# Patient Record
Sex: Female | Born: 1970 | Race: Black or African American | Hispanic: No | Marital: Single | Smoking: Never smoker
Health system: Southern US, Community
[De-identification: ages and names within clinical notes are randomized; demographics above are authoritative.]

## PROBLEM LIST (undated history)

## (undated) DIAGNOSIS — G629 Polyneuropathy, unspecified: Secondary | ICD-10-CM

## (undated) DIAGNOSIS — S43006A Unspecified dislocation of unspecified shoulder joint, initial encounter: Secondary | ICD-10-CM

## (undated) DIAGNOSIS — I639 Cerebral infarction, unspecified: Secondary | ICD-10-CM

## (undated) DIAGNOSIS — K219 Gastro-esophageal reflux disease without esophagitis: Secondary | ICD-10-CM

## (undated) DIAGNOSIS — N189 Chronic kidney disease, unspecified: Secondary | ICD-10-CM

## (undated) DIAGNOSIS — F32A Depression, unspecified: Secondary | ICD-10-CM

## (undated) DIAGNOSIS — I10 Essential (primary) hypertension: Secondary | ICD-10-CM

## (undated) DIAGNOSIS — Z87442 Personal history of urinary calculi: Secondary | ICD-10-CM

## (undated) DIAGNOSIS — J45909 Unspecified asthma, uncomplicated: Secondary | ICD-10-CM

## (undated) DIAGNOSIS — R519 Headache, unspecified: Secondary | ICD-10-CM

## (undated) DIAGNOSIS — I739 Peripheral vascular disease, unspecified: Secondary | ICD-10-CM

## (undated) DIAGNOSIS — F419 Anxiety disorder, unspecified: Secondary | ICD-10-CM

## (undated) DIAGNOSIS — M5030 Other cervical disc degeneration, unspecified cervical region: Secondary | ICD-10-CM

## (undated) DIAGNOSIS — E119 Type 2 diabetes mellitus without complications: Secondary | ICD-10-CM

## (undated) DIAGNOSIS — D649 Anemia, unspecified: Secondary | ICD-10-CM

## (undated) DIAGNOSIS — J189 Pneumonia, unspecified organism: Secondary | ICD-10-CM

## (undated) HISTORY — DX: Chronic kidney disease, unspecified: N18.9

## (undated) HISTORY — PX: APPENDECTOMY: SHX54

## (undated) HISTORY — PX: SHOULDER SURGERY: SHX246

## (undated) HISTORY — DX: Type 2 diabetes mellitus without complications: E11.9

## (undated) HISTORY — DX: Anemia, unspecified: D64.9

## (undated) HISTORY — PX: FOOT SURGERY: SHX648

## (undated) HISTORY — PX: EYE SURGERY: SHX253

## (undated) HISTORY — DX: Unspecified dislocation of unspecified shoulder joint, initial encounter: S43.006A

## (undated) HISTORY — PX: ADENOIDECTOMY: SUR15

## (undated) HISTORY — PX: TONSILLECTOMY: SUR1361

---

## 2012-03-07 ENCOUNTER — Emergency Department (HOSPITAL_COMMUNITY)
Admission: EM | Admit: 2012-03-07 | Discharge: 2012-03-07 | Disposition: A | Discharge status: Home / Self Care | Payer: Medicaid Other | Attending: Emergency Medicine | Admitting: Emergency Medicine

## 2012-03-07 ENCOUNTER — Encounter (HOSPITAL_COMMUNITY): Payer: Self-pay | Admitting: *Deleted

## 2012-03-07 ENCOUNTER — Emergency Department (HOSPITAL_COMMUNITY): Payer: Medicaid Other

## 2012-03-07 DIAGNOSIS — E11319 Type 2 diabetes mellitus with unspecified diabetic retinopathy without macular edema: Secondary | ICD-10-CM | POA: Insufficient documentation

## 2012-03-07 DIAGNOSIS — Z9089 Acquired absence of other organs: Secondary | ICD-10-CM | POA: Insufficient documentation

## 2012-03-07 DIAGNOSIS — H5460 Unqualified visual loss, one eye, unspecified: Secondary | ICD-10-CM | POA: Insufficient documentation

## 2012-03-07 DIAGNOSIS — Z79899 Other long term (current) drug therapy: Secondary | ICD-10-CM | POA: Insufficient documentation

## 2012-03-07 DIAGNOSIS — I10 Essential (primary) hypertension: Secondary | ICD-10-CM | POA: Insufficient documentation

## 2012-03-07 DIAGNOSIS — Z794 Long term (current) use of insulin: Secondary | ICD-10-CM | POA: Insufficient documentation

## 2012-03-07 DIAGNOSIS — E1139 Type 2 diabetes mellitus with other diabetic ophthalmic complication: Secondary | ICD-10-CM | POA: Insufficient documentation

## 2012-03-07 DIAGNOSIS — K219 Gastro-esophageal reflux disease without esophagitis: Secondary | ICD-10-CM | POA: Insufficient documentation

## 2012-03-07 DIAGNOSIS — H5461 Unqualified visual loss, right eye, normal vision left eye: Secondary | ICD-10-CM

## 2012-03-07 HISTORY — DX: Gastro-esophageal reflux disease without esophagitis: K21.9

## 2012-03-07 HISTORY — DX: Polyneuropathy, unspecified: G62.9

## 2012-03-07 HISTORY — DX: Unspecified asthma, uncomplicated: J45.909

## 2012-03-07 HISTORY — DX: Essential (primary) hypertension: I10

## 2012-03-07 MED ORDER — ONDANSETRON 8 MG PO TBDP
8.0000 mg | ORAL_TABLET | Freq: Once | ORAL | Status: AC
  Administered 2012-03-07: 8 mg via ORAL
  Filled 2012-03-07: qty 1

## 2012-03-07 MED ORDER — TETRACAINE HCL 0.5 % OP SOLN
2.0000 [drp] | Freq: Once | OPHTHALMIC | Status: AC
  Administered 2012-03-07: 2 [drp] via OPHTHALMIC
  Filled 2012-03-07: qty 2

## 2012-03-07 MED ORDER — OXYCODONE-ACETAMINOPHEN 5-325 MG PO TABS
1.0000 | ORAL_TABLET | Freq: Once | ORAL | Status: AC
  Administered 2012-03-07: 1 via ORAL
  Filled 2012-03-07: qty 1

## 2012-03-07 NOTE — ED Notes (Signed)
Pt c/o loss of vision in right eye since Saturday. States has hx of diabetes and "the diabetes is affecting my vision, I was supposed to see an eye surgeon but didn't get too." reports "can't see that much out of right eye, I have pressure and blood built up in both of them but it is worse in my right eye."

## 2012-03-07 NOTE — ED Provider Notes (Signed)
History     CSN: [id number]  Arrival date & time 03/07/12  1123   First MD Initiated Contact with Patient 03/07/12 1155      Chief Complaint  Patient presents with  . Visual Field Change     HPI History obtained from the patient and her optometrist in [place].  The patient has a history of diabetic retinopathy and was being seen approximately 10 days ago by her optometrist for routine eyes examined and was noted that her diabetic retinopathy was worsening.  At that time her vision was 20/40 out of both eyes.  She reports she was referred to an ophthalmologist but because of a family emergency she had to present to [facility] was unable to make that appointment.  She now reports progressive vision loss in her right eye.  She's had transient floaters out of her right eye as well.  She now reports that she can't see the color and movement and can count fingers at approximately 3 feet only out of her right eye.  Besides that her vision was noted the 20/200 at the bedside.  She does report some right-sided headache and some right eye pain but reports this is mild.  She has not had any drainage or redness noted of her eyes.  Past Medical History  Diagnosis Date  . Diabetes mellitus   . Hypertension   . Asthma   . GERD (gastroesophageal reflux disease)   . Neuropathy     Past Surgical History  Procedure Date  . Tonsillectomy   . Appendectomy   . Adenoidectomy     History reviewed. No pertinent family history.  History  Substance Use Topics  . Smoking status: Not on file  . Smokeless tobacco: Not on file  . Alcohol Use: No    OB History    Grav Para Term Preterm Abortions TAB SAB Ect Mult Living                  Review of Systems  All other systems reviewed and are negative.    Allergies  Penicillins  Home Medications   Current Outpatient Rx  Name Route Sig Dispense Refill  . ACETAMINOPHEN 500 MG PO TABS Oral Take 1,000 mg by mouth every 6 (six)  hours as needed. For pain.    . ALBUTEROL SULFATE HFA 108 (90 BASE) MCG/ACT IN AERS Inhalation Inhale 2 puffs into the lungs every 6 (six) hours as needed. For shortness of breath.    . ASPIRIN EC 81 MG PO TBEC Oral Take 81 mg by mouth daily.    Marland Kitchen VITAMIN D 1000 UNITS PO TABS Oral Take 1,000 Units by mouth daily.    Marland Kitchen ESOMEPRAZOLE MAGNESIUM 40 MG PO CPDR Oral Take 40 mg by mouth daily before breakfast.    . INSULIN ASPART 100 UNIT/ML Theresa SOLN Subcutaneous Inject 0-10 Units into the skin 3 (three) times daily before meals.    . INSULIN GLARGINE 100 UNIT/ML [place] SOLN Subcutaneous Inject 50 Units into the skin at bedtime.    Marland Kitchen LIRAGLUTIDE 18 MG/3ML  SOLN Subcutaneous Inject 1.2 mg into the skin daily.    Marland Kitchen METOPROLOL SUCCINATE ER 25 MG PO TB24 Oral Take 25 mg by mouth daily.    Marland Kitchen VALSARTAN 160 MG PO TABS Oral Take 160 mg by mouth daily.      BP 155/95  Pulse 79  Temp 98.7 F (37.1 C) (Oral)  Resp 20  Wt 166 lb (75.297 kg)  SpO2 100%  Physical Exam  Nursing note and vitals reviewed. Constitutional: She is oriented to person, place, and time. She appears well-developed and well-nourished. No distress.  HENT:  Head: Normocephalic and atraumatic.  Eyes: Conjunctivae normal, EOM and lids are normal. Pupils are equal, round, and reactive to light. Right eye exhibits no chemosis, no discharge and no exudate. Left eye exhibits no chemosis, no discharge and no exudate. Right conjunctiva is not injected. Right eye exhibits no nystagmus. Left eye exhibits no nystagmus.       20/70 vision of left eye.  20/200 vision in the right eye.  Intraocular pressure in route I consistently in the 32-34 range.  Intraocular pressure in the left thigh consistently in the 20-24 range. No proptosis noted  Neck: Normal range of motion.  Cardiovascular: Normal rate, regular rhythm and normal heart sounds.   Pulmonary/Chest: Effort normal and breath sounds normal.  Abdominal: Soft. She exhibits no distension. There is no  tenderness.  Musculoskeletal: Normal range of motion.  Neurological: She is alert and oriented to person, place, and time.  Skin: Skin is warm and dry.  Psychiatric: She has a normal mood and affect. Judgment normal.    ED Course  Procedures    Labs Reviewed  GLUCOSE, CAPILLARY   Ct Head Wo Contrast  03/07/2012  *RADIOLOGY REPORT*  Clinical Data: Headache, blurred vision  CT HEAD WITHOUT CONTRAST  Technique:  Contiguous axial images were obtained from the base of the skull through the vertex without contrast.  Comparison: None.  Findings: No skull fracture is noted.  Paranasal sinuses and mastoid air cells are unremarkable.  No intracranial hemorrhage, mass effect or midline shift.  No acute infarction.  No mass lesion is noted on this unenhanced scan.  The gray and white matter differentiation is preserved.  No hydrocephalus.  No intra or extra-axial fluid collection.  IMPRESSION:  No acute intracranial abnormality.   Original Report Authenticated By: Lahoma Crocker, M.D.     I personally reviewed the imaging tests through PACS system I reviewed available ER/hospitalization records thought the EMR   1. Vision loss, right eye       MDM  Patient with worsening vision in her right eye over the past 5 days.  Her vision in her right eye is significantly worse than approximately 10 days ago when I discussed her care with her optometrist in [place].  Her vision in her right eye is 20/200.  She also appears to have elevated intraocular pressures in the right eye but is abnormal given that she also has some elevated pressures in her left eye and her vision in her left eye is worsening.  Because of this a CT scan was obtained demonstrating no evidence of intracranial mass or posterior orbital masses.  I discussed her care with the on-call ophthalmologist, Dr Talbert Forest,  who will see her in his office at this time for more detailed eye exam.   [id number]- [place] in  [place], MD 03/07/12 [phone]

## 2012-03-07 NOTE — ED Notes (Signed)
Pt states headache 10/10 to left side, stabbing pain.  Hx of migraines, but pt states "this is not like my migraines."  Vision impairment began Saturday seeing "bugs flying" to currently "I only see blurs of light and color", headache began last night.  Pt able to read vision card top 2 lines from right eye.

## 2012-06-12 HISTORY — PX: CERVICAL SPINE SURGERY: SHX589

## 2012-07-23 ENCOUNTER — Ambulatory Visit: Payer: Medicaid Other | Admitting: Physical Therapy

## 2012-09-03 ENCOUNTER — Emergency Department (HOSPITAL_COMMUNITY)
Admission: EM | Admit: 2012-09-03 | Discharge: 2012-09-03 | Disposition: A | Discharge status: Home / Self Care | Payer: Medicaid Other | Attending: Emergency Medicine | Admitting: Emergency Medicine

## 2012-09-03 ENCOUNTER — Encounter (HOSPITAL_COMMUNITY): Payer: Self-pay

## 2012-09-03 DIAGNOSIS — J45909 Unspecified asthma, uncomplicated: Secondary | ICD-10-CM | POA: Insufficient documentation

## 2012-09-03 DIAGNOSIS — IMO0002 Reserved for concepts with insufficient information to code with codable children: Secondary | ICD-10-CM | POA: Insufficient documentation

## 2012-09-03 DIAGNOSIS — E119 Type 2 diabetes mellitus without complications: Secondary | ICD-10-CM | POA: Insufficient documentation

## 2012-09-03 DIAGNOSIS — Z79899 Other long term (current) drug therapy: Secondary | ICD-10-CM | POA: Insufficient documentation

## 2012-09-03 DIAGNOSIS — K219 Gastro-esophageal reflux disease without esophagitis: Secondary | ICD-10-CM | POA: Insufficient documentation

## 2012-09-03 DIAGNOSIS — I10 Essential (primary) hypertension: Secondary | ICD-10-CM | POA: Insufficient documentation

## 2012-09-03 DIAGNOSIS — M5412 Radiculopathy, cervical region: Secondary | ICD-10-CM

## 2012-09-03 DIAGNOSIS — Z8669 Personal history of other diseases of the nervous system and sense organs: Secondary | ICD-10-CM | POA: Insufficient documentation

## 2012-09-03 DIAGNOSIS — M542 Cervicalgia: Secondary | ICD-10-CM | POA: Insufficient documentation

## 2012-09-03 DIAGNOSIS — G8929 Other chronic pain: Secondary | ICD-10-CM

## 2012-09-03 DIAGNOSIS — Z794 Long term (current) use of insulin: Secondary | ICD-10-CM | POA: Insufficient documentation

## 2012-09-03 HISTORY — DX: Other cervical disc degeneration, unspecified cervical region: M50.30

## 2012-09-03 MED ORDER — HYDROCODONE-ACETAMINOPHEN 5-325 MG PO TABS
1.0000 | ORAL_TABLET | Freq: Once | ORAL | Status: AC
  Administered 2012-09-03: 1 via ORAL
  Filled 2012-09-03: qty 1

## 2012-09-03 MED ORDER — HYDROCODONE-ACETAMINOPHEN 5-325 MG PO TABS: 1.0000 | ORAL_TABLET | ORAL | Status: DC | PRN

## 2012-09-03 NOTE — ED Notes (Signed)
Patient presents with c/o right sided, posterior neck pain that radiates down right shoulder, arm and fingers. Denies numbness or tingling to RUE. Describes pain as a constant pain. Rates 10/10. 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 Had an artifical disc placed in January 2014. Patient reports that pain has not improved since her surgery.
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
## 2012-09-03 NOTE — ED Notes (Signed)
Pt stated that boyfriend is going to drive her home.  Discussed not performing activities that required alertness when taking pain medications.

## 2012-09-03 NOTE — ED Notes (Signed)
Pt had sx in January to replace disc in neck and it has not relieved the pain.  Pt states the pain is actually getting worse.  Neck hurts on extension.  Pain radiates down to right shoulder and arm.  Denies numbness and tingling.  Grip are equal bilaterally.  Pt can only lift right arm up to shoulder height before experiencing extreme pain.

## 2012-09-04 NOTE — ED Provider Notes (Signed)
Medical screening examination/treatment/procedure(s) were performed by non-physician practitioner and as supervising physician I was immediately available for consultation/collaboration.   Dot Lanes, MD 09/04/12 2239

## 2012-09-09 ENCOUNTER — Ambulatory Visit: Payer: Medicaid Other | Attending: Neurosurgery | Admitting: Physical Therapy

## 2012-09-09 DIAGNOSIS — M542 Cervicalgia: Secondary | ICD-10-CM | POA: Insufficient documentation

## 2012-09-09 DIAGNOSIS — IMO0001 Reserved for inherently not codable concepts without codable children: Secondary | ICD-10-CM | POA: Insufficient documentation

## 2012-09-09 DIAGNOSIS — M25519 Pain in unspecified shoulder: Secondary | ICD-10-CM | POA: Insufficient documentation

## 2012-09-17 ENCOUNTER — Ambulatory Visit: Payer: Medicaid Other | Admitting: Physical Therapy

## 2012-09-28 ENCOUNTER — Emergency Department (HOSPITAL_COMMUNITY)
Admission: EM | Admit: 2012-09-28 | Discharge: 2012-09-28 | Disposition: A | Discharge status: Home / Self Care | Payer: Medicaid Other | Attending: Emergency Medicine | Admitting: Emergency Medicine

## 2012-09-28 ENCOUNTER — Emergency Department (HOSPITAL_COMMUNITY): Payer: Medicaid Other

## 2012-09-28 ENCOUNTER — Encounter (HOSPITAL_COMMUNITY): Payer: Self-pay | Admitting: *Deleted

## 2012-09-28 DIAGNOSIS — J45909 Unspecified asthma, uncomplicated: Secondary | ICD-10-CM | POA: Insufficient documentation

## 2012-09-28 DIAGNOSIS — T7411XA Adult physical abuse, confirmed, initial encounter: Secondary | ICD-10-CM | POA: Insufficient documentation

## 2012-09-28 DIAGNOSIS — R0602 Shortness of breath: Secondary | ICD-10-CM | POA: Insufficient documentation

## 2012-09-28 DIAGNOSIS — E1142 Type 2 diabetes mellitus with diabetic polyneuropathy: Secondary | ICD-10-CM | POA: Insufficient documentation

## 2012-09-28 DIAGNOSIS — K219 Gastro-esophageal reflux disease without esophagitis: Secondary | ICD-10-CM | POA: Insufficient documentation

## 2012-09-28 DIAGNOSIS — Z8739 Personal history of other diseases of the musculoskeletal system and connective tissue: Secondary | ICD-10-CM | POA: Insufficient documentation

## 2012-09-28 DIAGNOSIS — I10 Essential (primary) hypertension: Secondary | ICD-10-CM | POA: Insufficient documentation

## 2012-09-28 DIAGNOSIS — S20212A Contusion of left front wall of thorax, initial encounter: Secondary | ICD-10-CM

## 2012-09-28 DIAGNOSIS — S20219A Contusion of unspecified front wall of thorax, initial encounter: Secondary | ICD-10-CM | POA: Insufficient documentation

## 2012-09-28 DIAGNOSIS — Z794 Long term (current) use of insulin: Secondary | ICD-10-CM | POA: Insufficient documentation

## 2012-09-28 DIAGNOSIS — Z79899 Other long term (current) drug therapy: Secondary | ICD-10-CM | POA: Insufficient documentation

## 2012-09-28 DIAGNOSIS — S2341XA Sprain of ribs, initial encounter: Secondary | ICD-10-CM

## 2012-09-28 DIAGNOSIS — E1149 Type 2 diabetes mellitus with other diabetic neurological complication: Secondary | ICD-10-CM | POA: Insufficient documentation

## 2012-09-28 MED ORDER — IBUPROFEN 400 MG PO TABS
400.0000 mg | ORAL_TABLET | Freq: Once | ORAL | Status: AC
  Administered 2012-09-28: 400 mg via ORAL
  Filled 2012-09-28: qty 1

## 2012-09-28 MED ORDER — CYCLOBENZAPRINE HCL 10 MG PO TABS: 10.0000 mg | ORAL_TABLET | Freq: Two times a day (BID) | ORAL | Status: DC | PRN

## 2012-09-28 MED ORDER — HYDROCODONE-ACETAMINOPHEN 5-325 MG PO TABS: 1.0000 | ORAL_TABLET | Freq: Four times a day (QID) | ORAL | Status: DC | PRN
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
## 2012-09-28 NOTE — ED Notes (Signed)
The patient is AOx4 and comfortable with her discharge instructions. 
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
## 2012-09-30 NOTE — ED Provider Notes (Signed)
Medical screening examination/treatment/procedure(s) were performed by non-physician practitioner and as supervising physician I was immediately available for consultation/collaboration.   Mylinda Latina III, MD 09/30/12 [phone]

## 2012-12-07 ENCOUNTER — Emergency Department (HOSPITAL_COMMUNITY)
Admission: EM | Admit: 2012-12-07 | Discharge: 2012-12-07 | Disposition: A | Discharge status: Home / Self Care | Payer: Medicaid Other | Attending: Emergency Medicine | Admitting: Emergency Medicine

## 2012-12-07 ENCOUNTER — Emergency Department (HOSPITAL_COMMUNITY): Payer: Medicaid Other

## 2012-12-07 ENCOUNTER — Encounter (HOSPITAL_COMMUNITY): Payer: Self-pay | Admitting: Emergency Medicine

## 2012-12-07 DIAGNOSIS — S20219A Contusion of unspecified front wall of thorax, initial encounter: Secondary | ICD-10-CM | POA: Insufficient documentation

## 2012-12-07 DIAGNOSIS — Z79899 Other long term (current) drug therapy: Secondary | ICD-10-CM | POA: Insufficient documentation

## 2012-12-07 DIAGNOSIS — S20212A Contusion of left front wall of thorax, initial encounter: Secondary | ICD-10-CM

## 2012-12-07 DIAGNOSIS — K219 Gastro-esophageal reflux disease without esophagitis: Secondary | ICD-10-CM | POA: Insufficient documentation

## 2012-12-07 DIAGNOSIS — Z8669 Personal history of other diseases of the nervous system and sense organs: Secondary | ICD-10-CM | POA: Insufficient documentation

## 2012-12-07 DIAGNOSIS — E119 Type 2 diabetes mellitus without complications: Secondary | ICD-10-CM | POA: Insufficient documentation

## 2012-12-07 DIAGNOSIS — J45909 Unspecified asthma, uncomplicated: Secondary | ICD-10-CM | POA: Insufficient documentation

## 2012-12-07 DIAGNOSIS — Z794 Long term (current) use of insulin: Secondary | ICD-10-CM | POA: Insufficient documentation

## 2012-12-07 DIAGNOSIS — I10 Essential (primary) hypertension: Secondary | ICD-10-CM | POA: Insufficient documentation

## 2012-12-07 DIAGNOSIS — Z880 Allergy status to penicillin: Secondary | ICD-10-CM | POA: Insufficient documentation

## 2012-12-07 DIAGNOSIS — Z8739 Personal history of other diseases of the musculoskeletal system and connective tissue: Secondary | ICD-10-CM | POA: Insufficient documentation

## 2012-12-07 MED ORDER — HYDROCODONE-ACETAMINOPHEN 5-325 MG PO TABS: 1.0000 | ORAL_TABLET | Freq: Four times a day (QID) | ORAL | Status: DC | PRN

## 2012-12-07 MED ORDER — HYDROCODONE-ACETAMINOPHEN 5-325 MG PO TABS: 1.0000 | ORAL_TABLET | Freq: Once | ORAL | Status: DC

## 2012-12-07 MED ORDER — IBUPROFEN 800 MG PO TABS: 800.0000 mg | ORAL_TABLET | Freq: Three times a day (TID) | ORAL | Status: DC

## 2012-12-07 MED ORDER — ONDANSETRON 4 MG PO TBDP
8.0000 mg | ORAL_TABLET | Freq: Once | ORAL | Status: AC
  Administered 2012-12-07: 8 mg via ORAL
  Filled 2012-12-07: qty 2

## 2012-12-07 MED ORDER — IBUPROFEN 800 MG PO TABS
800.0000 mg | ORAL_TABLET | Freq: Once | ORAL | Status: AC
  Administered 2012-12-07: 800 mg via ORAL
  Filled 2012-12-07: qty 1
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
## 2012-12-07 NOTE — ED Notes (Signed)
"  Son in vehicle out side of ED, son here for ride home".

## 2012-12-07 NOTE — ED Notes (Signed)
Pharm tech at [facility], pt alert, NAD, calm, interactive, resps e/u, speaking in clear complete sentences, rates pain 10/10, also reports nausea. Xray results reviewed.
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
## 2012-12-26 ENCOUNTER — Encounter (HOSPITAL_COMMUNITY): Payer: Self-pay | Admitting: Emergency Medicine

## 2012-12-26 ENCOUNTER — Emergency Department (HOSPITAL_COMMUNITY)
Admission: EM | Admit: 2012-12-26 | Discharge: 2012-12-26 | Disposition: A | Discharge status: Home / Self Care | Payer: Medicaid Other | Attending: Emergency Medicine | Admitting: Emergency Medicine

## 2012-12-26 DIAGNOSIS — Z79899 Other long term (current) drug therapy: Secondary | ICD-10-CM | POA: Insufficient documentation

## 2012-12-26 DIAGNOSIS — L0390 Cellulitis, unspecified: Secondary | ICD-10-CM

## 2012-12-26 DIAGNOSIS — E1049 Type 1 diabetes mellitus with other diabetic neurological complication: Secondary | ICD-10-CM | POA: Insufficient documentation

## 2012-12-26 DIAGNOSIS — G589 Mononeuropathy, unspecified: Secondary | ICD-10-CM | POA: Insufficient documentation

## 2012-12-26 DIAGNOSIS — K219 Gastro-esophageal reflux disease without esophagitis: Secondary | ICD-10-CM | POA: Insufficient documentation

## 2012-12-26 DIAGNOSIS — J029 Acute pharyngitis, unspecified: Secondary | ICD-10-CM | POA: Insufficient documentation

## 2012-12-26 DIAGNOSIS — Z880 Allergy status to penicillin: Secondary | ICD-10-CM | POA: Insufficient documentation

## 2012-12-26 DIAGNOSIS — J45909 Unspecified asthma, uncomplicated: Secondary | ICD-10-CM | POA: Insufficient documentation

## 2012-12-26 DIAGNOSIS — Z794 Long term (current) use of insulin: Secondary | ICD-10-CM | POA: Insufficient documentation

## 2012-12-26 DIAGNOSIS — M5030 Other cervical disc degeneration, unspecified cervical region: Secondary | ICD-10-CM | POA: Insufficient documentation

## 2012-12-26 DIAGNOSIS — L03211 Cellulitis of face: Secondary | ICD-10-CM | POA: Insufficient documentation

## 2012-12-26 DIAGNOSIS — I10 Essential (primary) hypertension: Secondary | ICD-10-CM | POA: Insufficient documentation

## 2012-12-26 DIAGNOSIS — H5789 Other specified disorders of eye and adnexa: Secondary | ICD-10-CM

## 2012-12-26 DIAGNOSIS — H6690 Otitis media, unspecified, unspecified ear: Secondary | ICD-10-CM | POA: Insufficient documentation

## 2012-12-26 DIAGNOSIS — L0201 Cutaneous abscess of face: Secondary | ICD-10-CM | POA: Insufficient documentation

## 2012-12-26 LAB — GLUCOSE, CAPILLARY: Glucose-Capillary: 223 mg/dL — ABNORMAL HIGH (ref 70–99)

## 2012-12-26 MED ORDER — SULFAMETHOXAZOLE-TRIMETHOPRIM 800-160 MG PO TABS: 1.0000 | ORAL_TABLET | Freq: Two times a day (BID) | ORAL | Status: DC

## 2012-12-26 MED ORDER — AZITHROMYCIN 250 MG PO TABS
1000.0000 mg | ORAL_TABLET | Freq: Once | ORAL | Status: AC
  Administered 2012-12-26: 1000 mg via ORAL
  Filled 2012-12-26: qty 4

## 2012-12-26 NOTE — ED Provider Notes (Signed)
Medical screening examination/treatment/procedure(s) were conducted as a shared visit with non-physician practitioner(s) and myself.  I personally evaluated the patient during the encounter Pt has had redness, swelling and pain in the left upper and lower eyelids, with left eye conjunctival redness, not helped by Rx with Tobradex eye drops.  Recommended systemic antibiotic Rx, ophthalmology F/U.   Mylinda Latina III, MD 12/26/12 2035

## 2012-12-26 NOTE — ED Notes (Addendum)
Pt started 7/11 with left eye irritation and redness. Saw an optholmologist  (DrSander's office)  on 7/13 and was prescribed Tobradex. Pt states left eye has gotten worse. Left eye lid swelling. Now right eye becoming red and irritated.

## 2012-12-26 NOTE — ED Provider Notes (Signed)
History    CSN: [id number] Arrival date & time 12/26/12  1022  First MD Initiated Contact with Patient 12/26/12 1042     Chief Complaint  Patient presents with  . Eye Problem   (Consider location/radiation/quality/duration/timing/severity/associated sxs/prior Treatment) HPI Pt is a 41yo female seen by Dr. Baird Cancer Monday 7/14, for left eye pain, redness, and swelling.  She was given tobradex eye drops however states her symptoms have gradually worsened since then.  She has increased swelling around left eye with clear eye drainage. Pain is constant, burning, 10/10.  Also reports left ear pain, aching, 3/10, constant.   Nothing makes symptoms better.  Denies fever, n/v/d. Denies change in vision.  Does not wear contacts or glasses.    Past Medical History  Diagnosis Date  . Diabetes mellitus   . Hypertension   . Asthma   . GERD (gastroesophageal reflux disease)   . Neuropathy   . DDD (degenerative disc disease), cervical    Past Surgical History  Procedure Laterality Date  . Tonsillectomy    . Appendectomy    . Adenoidectomy    . Cervical spine surgery  06/2012    C5-C6   No family history on file. History  Substance Use Topics  . Smoking status: Never Smoker   . Smokeless tobacco: Never Used  . Alcohol Use: No   OB History   Grav Para Term Preterm Abortions TAB SAB Ect Mult Living                 Review of Systems  Constitutional: Negative for fever and diaphoresis.  HENT: Positive for ear pain ( left) and sore throat.   Eyes: Positive for pain, discharge (clear) and redness. Negative for photophobia, itching and visual disturbance.  All other systems reviewed and are negative.    Allergies  Aspirin and Penicillins  Home Medications   Current Outpatient Rx  Name  Route  Sig  Dispense  Refill  . albuterol (PROVENTIL HFA;VENTOLIN HFA) 108 (90 BASE) MCG/ACT inhaler   Inhalation   Inhale 2 puffs into the lungs every 6 (six) hours as needed. For shortness of  breath.         . cholecalciferol (VITAMIN D) 1000 UNITS tablet   Oral   Take 1,000 Units by mouth daily.         . DULoxetine (CYMBALTA) 20 MG capsule   Oral   Take 20 mg by mouth daily.         Marland Kitchen esomeprazole (NEXIUM) 40 MG capsule   Oral   Take 40 mg by mouth daily before breakfast.         . HYDROcodone-acetaminophen (NORCO) 10-325 MG per tablet   Oral   Take 1 tablet by mouth every 8 (eight) hours as needed for pain.         Marland Kitchen insulin aspart (NOVOLOG) 100 UNIT/ML injection   Subcutaneous   Inject 6 Units into the skin 3 (three) times daily before meals.          . insulin glargine (LANTUS) 100 UNIT/ML injection   Subcutaneous   Inject 30 Units into the skin at bedtime.          . metoprolol succinate (TOPROL-XL) 50 MG 24 hr tablet   Oral   Take 50 mg by mouth daily. Take with or immediately following a meal.         . potassium chloride SA (K-DUR,KLOR-CON) 20 MEQ tablet   Oral   Take 20 mEq  by mouth daily.         Marland Kitchen tiZANidine (ZANAFLEX) 4 MG tablet   Oral   Take 4 mg by mouth at bedtime.          Marland Kitchen tobramycin-dexamethasone (TOBRADEX) ophthalmic solution   Ophthalmic   Apply 1 drop to eye See admin instructions. 1 drop left eye 4 times a day x 2 days, then 3 times a day x 2 days, then 2 times a day x 2 days, then, once a day for 2 days.. Pt on day 4         . valsartan (DIOVAN) 320 MG tablet   Oral   Take 320 mg by mouth daily.         Marland Kitchen sulfamethoxazole-trimethoprim (SEPTRA DS) 800-160 MG per tablet   Oral   Take 1 tablet by mouth every 12 (twelve) hours.   14 tablet   0    BP 164/103  Pulse 86  Temp(Src) 100.1 F (37.8 C) (Oral)  SpO2 100%  LMP 12/15/2012 Physical Exam  Nursing note and vitals reviewed. Constitutional: She appears well-developed and well-nourished.  HENT:  Head: Normocephalic and atraumatic.  Eyes: Conjunctivae and EOM are normal. Pupils are equal, round, and reactive to light. Right eye exhibits no  discharge. Left eye exhibits discharge ( scant, clear). No scleral icterus.    TTP in upper and lower eyelid. FROM of left eye. Scant clear drainage.  No erythema.    Neck: Normal range of motion. Neck supple.  Cardiovascular: Normal rate, regular rhythm and normal heart sounds.   Pulmonary/Chest: Effort normal and breath sounds normal. No respiratory distress. She has no wheezes. She has no rales. She exhibits no tenderness.  Musculoskeletal: Normal range of motion.  Neurological: She is alert.  Skin: Skin is warm and dry.    ED Course  Procedures (including critical care time) Labs Reviewed  GLUCOSE, CAPILLARY - Abnormal; Notable for the following:    Glucose-Capillary 223 (*)    All other components within normal limits   No results found. 1. Irritation of left eye   2. Cellulitis     MDM  Called Dr. Baird Cancer office, will start pt on bactrim. F/u if not improving. Finish antibiotic eye drops as well.  Tx in ED: azithromycin as suggested by Dr. Lynder Parents office.  Pt discharged home with new prescriptions.  Rx: bactrim.  May take OTC ibuprofen as needed for pain.    Noland Fordyce, PA-C 12/26/12 1616

## 2012-12-27 ENCOUNTER — Encounter (HOSPITAL_COMMUNITY): Payer: Self-pay | Admitting: *Deleted

## 2012-12-27 ENCOUNTER — Emergency Department (HOSPITAL_COMMUNITY)
Admission: EM | Admit: 2012-12-27 | Discharge: 2012-12-27 | Discharge status: Against Medical Advice | Payer: Medicaid Other | Attending: Emergency Medicine | Admitting: Emergency Medicine

## 2012-12-27 DIAGNOSIS — E119 Type 2 diabetes mellitus without complications: Secondary | ICD-10-CM | POA: Insufficient documentation

## 2012-12-27 DIAGNOSIS — H05019 Cellulitis of unspecified orbit: Secondary | ICD-10-CM | POA: Insufficient documentation

## 2012-12-27 DIAGNOSIS — H5710 Ocular pain, unspecified eye: Secondary | ICD-10-CM | POA: Insufficient documentation

## 2012-12-27 DIAGNOSIS — J45909 Unspecified asthma, uncomplicated: Secondary | ICD-10-CM | POA: Insufficient documentation

## 2012-12-27 DIAGNOSIS — I10 Essential (primary) hypertension: Secondary | ICD-10-CM | POA: Insufficient documentation

## 2012-12-27 NOTE — ED Notes (Signed)
Pt reports having eye swelling, redness, pain. Was seen here yesterday for same and dx with cellulitis and given prescription for septra. Reports it was severe in left eye yesterday but is now spreading to right eye. Swelling noted to eyes.

## 2012-12-27 NOTE — ED Notes (Signed)
No answer, unable to find, not in w/r, entryway, triage, h/w or b/r.

## 2012-12-27 NOTE — ED Notes (Signed)
PT ambulatory in [facility], NAD

## 2012-12-27 NOTE — ED Notes (Signed)
Called pt x1 no answer

## 2013-07-17 ENCOUNTER — Encounter (HOSPITAL_COMMUNITY): Payer: Self-pay | Admitting: Emergency Medicine

## 2013-07-17 ENCOUNTER — Emergency Department (HOSPITAL_COMMUNITY)
Admission: EM | Admit: 2013-07-17 | Discharge: 2013-07-17 | Disposition: A | Discharge status: Home / Self Care | Payer: Medicaid Other | Attending: Emergency Medicine | Admitting: Emergency Medicine

## 2013-07-17 DIAGNOSIS — Z880 Allergy status to penicillin: Secondary | ICD-10-CM | POA: Insufficient documentation

## 2013-07-17 DIAGNOSIS — G8929 Other chronic pain: Secondary | ICD-10-CM | POA: Insufficient documentation

## 2013-07-17 DIAGNOSIS — G589 Mononeuropathy, unspecified: Secondary | ICD-10-CM | POA: Insufficient documentation

## 2013-07-17 DIAGNOSIS — Z792 Long term (current) use of antibiotics: Secondary | ICD-10-CM | POA: Insufficient documentation

## 2013-07-17 DIAGNOSIS — Z79899 Other long term (current) drug therapy: Secondary | ICD-10-CM | POA: Insufficient documentation

## 2013-07-17 DIAGNOSIS — I10 Essential (primary) hypertension: Secondary | ICD-10-CM | POA: Insufficient documentation

## 2013-07-17 DIAGNOSIS — E119 Type 2 diabetes mellitus without complications: Secondary | ICD-10-CM | POA: Insufficient documentation

## 2013-07-17 DIAGNOSIS — J45909 Unspecified asthma, uncomplicated: Secondary | ICD-10-CM | POA: Insufficient documentation

## 2013-07-17 DIAGNOSIS — K219 Gastro-esophageal reflux disease without esophagitis: Secondary | ICD-10-CM | POA: Insufficient documentation

## 2013-07-17 DIAGNOSIS — M5030 Other cervical disc degeneration, unspecified cervical region: Secondary | ICD-10-CM | POA: Insufficient documentation

## 2013-07-17 DIAGNOSIS — Z9889 Other specified postprocedural states: Secondary | ICD-10-CM | POA: Insufficient documentation

## 2013-07-17 DIAGNOSIS — Z794 Long term (current) use of insulin: Secondary | ICD-10-CM | POA: Insufficient documentation

## 2013-07-17 DIAGNOSIS — M25519 Pain in unspecified shoulder: Secondary | ICD-10-CM

## 2013-07-17 MED ORDER — KETOROLAC TROMETHAMINE 60 MG/2ML IM SOLN
60.0000 mg | Freq: Once | INTRAMUSCULAR | Status: AC
  Administered 2013-07-17: 60 mg via INTRAMUSCULAR
  Filled 2013-07-17: qty 2

## 2013-07-17 NOTE — ED Provider Notes (Signed)
Medical screening examination/treatment/procedure(s) were performed by non-physician practitioner and as supervising physician I was immediately available for consultation/collaboration.  EKG Interpretation   None       Rolland Porter, MD, [facility] Sander   Janice Norrie, MD 07/17/13 [phone]

## 2013-07-17 NOTE — Discharge Instructions (Signed)
Continue your endocet, muscle relaxer and mobic as prescribed.  Rest your shoulder but take out sling at least twice a day and do gentle range of motion exercises to prevent stiffness.   Follow up with the orthopedic physician as well as pain management asap.   You may return to the ER if symptoms worsen or you have any other concerns.
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
## 2013-07-17 NOTE — ED Notes (Signed)
Patient states chronic shoulder pain since 2012.  Patient states she had herniated disc which she had surgery on in 2014, but the pain has not stopped.   Patient states she has not re-injured area that she is aware of.

## 2013-11-12 ENCOUNTER — Ambulatory Visit: Payer: Medicaid Other | Attending: Orthopaedic Surgery | Admitting: Physical Therapy

## 2013-11-12 DIAGNOSIS — M25519 Pain in unspecified shoulder: Secondary | ICD-10-CM | POA: Insufficient documentation

## 2013-11-12 DIAGNOSIS — M6281 Muscle weakness (generalized): Secondary | ICD-10-CM | POA: Diagnosis not present

## 2013-11-12 DIAGNOSIS — IMO0001 Reserved for inherently not codable concepts without codable children: Secondary | ICD-10-CM | POA: Insufficient documentation

## 2013-11-12 DIAGNOSIS — M25619 Stiffness of unspecified shoulder, not elsewhere classified: Secondary | ICD-10-CM | POA: Diagnosis not present

## 2013-12-16 ENCOUNTER — Emergency Department (INDEPENDENT_AMBULATORY_CARE_PROVIDER_SITE_OTHER)
Admission: EM | Admit: 2013-12-16 | Discharge: 2013-12-16 | Disposition: A | Discharge status: Home / Self Care | Payer: Medicaid Other | Source: Home / Self Care | Attending: Family Medicine | Admitting: Family Medicine

## 2013-12-16 ENCOUNTER — Encounter (HOSPITAL_COMMUNITY): Payer: Self-pay | Admitting: Emergency Medicine

## 2013-12-16 DIAGNOSIS — G44209 Tension-type headache, unspecified, not intractable: Secondary | ICD-10-CM

## 2013-12-16 DIAGNOSIS — R112 Nausea with vomiting, unspecified: Secondary | ICD-10-CM

## 2013-12-16 DIAGNOSIS — I10 Essential (primary) hypertension: Secondary | ICD-10-CM

## 2013-12-16 LAB — COMPREHENSIVE METABOLIC PANEL
ALT: 13 U/L (ref 0–35)
ANION GAP: 14 (ref 5–15)
AST: 14 U/L (ref 0–37)
Albumin: 4.2 g/dL (ref 3.5–5.2)
Alkaline Phosphatase: 87 U/L (ref 39–117)
BUN: 16 mg/dL (ref 6–23)
CALCIUM: 9.7 mg/dL (ref 8.4–10.5)
CO2: 25 mEq/L (ref 19–32)
CREATININE: 0.79 mg/dL (ref 0.50–1.10)
Chloride: 99 mEq/L (ref 96–112)
GFR calc non Af Amer: >90 mL/min (ref >90)
GLUCOSE: 234 mg/dL — AB (ref 70–99)
Potassium: 4.1 mEq/L (ref 3.7–5.3)
SODIUM: 138 meq/L (ref 137–147)
TOTAL PROTEIN: 8.8 g/dL — AB (ref 6.0–8.3)
Total Bilirubin: 0.2 mg/dL — ABNORMAL LOW (ref 0.3–1.2)

## 2013-12-16 LAB — POCT I-STAT, CHEM 8
BUN: 10 mg/dL (ref 6–23)
CALCIUM ION: 1.14 mmol/L (ref 1.12–1.23)
CREATININE: 0.8 mg/dL (ref 0.50–1.10)
Chloride: 99 mEq/L (ref 96–112)
Glucose, Bld: 241 mg/dL — ABNORMAL HIGH (ref 70–99)
HCT: 41 % (ref 36.0–46.0)
Hemoglobin: 13.9 g/dL (ref 12.0–15.0)
Potassium: 3.9 mEq/L (ref 3.7–5.3)
Sodium: 135 mEq/L — ABNORMAL LOW (ref 137–147)
TCO2: 25 mmol/L (ref 0–100)

## 2013-12-16 LAB — CBC WITH DIFFERENTIAL/PLATELET
Basophils Absolute: 0.1 10*3/uL (ref 0.0–0.1)
Basophils Relative: 1 % (ref 0–1)
EOS ABS: 0.2 10*3/uL (ref 0.0–0.7)
EOS PCT: 2 % (ref 0–5)
HCT: 35 % — ABNORMAL LOW (ref 36.0–46.0)
Hemoglobin: 11.7 g/dL — ABNORMAL LOW (ref 12.0–15.0)
LYMPHS ABS: 2.5 10*3/uL (ref 0.7–4.0)
Lymphocytes Relative: 31 % (ref 12–46)
MCH: 28.3 pg (ref 26.0–34.0)
MCHC: 33.4 g/dL (ref 30.0–36.0)
MCV: 84.5 fL (ref 78.0–100.0)
MONO ABS: 0.4 10*3/uL (ref 0.1–1.0)
Monocytes Relative: 4 % (ref 3–12)
Neutro Abs: 5.1 10*3/uL (ref 1.7–7.7)
Neutrophils Relative %: 62 % (ref 43–77)
PLATELETS: 360 10*3/uL (ref 150–400)
RBC: 4.14 MIL/uL (ref 3.87–5.11)
RDW: 13.4 % (ref 11.5–15.5)
WBC: 8.2 10*3/uL (ref 4.0–10.5)

## 2013-12-16 MED ORDER — CLONIDINE HCL 0.1 MG PO TABS
0.1000 mg | ORAL_TABLET | Freq: Once | ORAL | Status: AC
  Administered 2013-12-16: 0.1 mg via ORAL

## 2013-12-16 MED ORDER — ONDANSETRON HCL 4 MG/2ML IJ SOLN
4.0000 mg | Freq: Once | INTRAMUSCULAR | Status: AC
  Administered 2013-12-16: 4 mg via INTRAVENOUS

## 2013-12-16 MED ORDER — KETOROLAC TROMETHAMINE 30 MG/ML IJ SOLN
30.0000 mg | Freq: Once | INTRAMUSCULAR | Status: AC
  Administered 2013-12-16: 30 mg via INTRAVENOUS

## 2013-12-16 MED ORDER — KETOROLAC TROMETHAMINE 30 MG/ML IJ SOLN
INTRAMUSCULAR | Status: AC
  Filled 2013-12-16: qty 1

## 2013-12-16 MED ORDER — SODIUM CHLORIDE 0.9 % IV BOLUS (SEPSIS)
1000.0000 mL | Freq: Once | INTRAVENOUS | Status: AC
  Administered 2013-12-16: 1000 mL via INTRAVENOUS

## 2013-12-16 MED ORDER — SODIUM CHLORIDE 0.9 % IV SOLN
Freq: Once | INTRAVENOUS | Status: AC
  Administered 2013-12-16: 11:00:00 via INTRAVENOUS

## 2013-12-16 MED ORDER — ONDANSETRON HCL 4 MG/2ML IJ SOLN
INTRAMUSCULAR | Status: AC
  Filled 2013-12-16: qty 2

## 2013-12-16 MED ORDER — CLONIDINE HCL 0.1 MG PO TABS
ORAL_TABLET | ORAL | Status: AC
  Filled 2013-12-16: qty 1

## 2013-12-16 NOTE — ED Notes (Signed)
Pt reports she vomited clonidine shortly after I gave it to her.

## 2013-12-16 NOTE — ED Notes (Signed)
Pt c/o HA x6 days; worse today Sx also include nauseas Denies SOB, CP, weakness, diaphoresis Taking BP med daily; not today due to feeling nauseas Has appt w/PCP on 7/23; PCP in [place], [place] and talking in complete sentences w/no signs of acute distress.

## 2013-12-16 NOTE — Discharge Instructions (Signed)
Tension Headache °A tension headache is a feeling of pain, pressure, or aching often felt over the front and sides of the head. The pain can be dull or can feel tight (constricting). It is the most common type of headache. Tension headaches are not normally associated with nausea or vomiting and do not get worse with physical activity. Tension headaches can last 30 minutes to several days.  °CAUSES  °The exact cause is not known, but it may be caused by chemicals and hormones in the brain that lead to pain. Tension headaches often begin after stress, anxiety, or depression. Other triggers may include: °· Alcohol. °· Caffeine (too much or withdrawal). °· Respiratory infections (colds, flu, sinus infections). °· Dental problems or teeth clenching. °· Fatigue. °· Holding your head and neck in one position too long while using a computer. °SYMPTOMS  °· Pressure around the head.   °· Dull, aching head pain.   °· Pain felt over the front and sides of the head.   °· Tenderness in the muscles of the head, neck, and shoulders. °DIAGNOSIS  °A tension headache is often diagnosed based on:  °· Symptoms.   °· Physical examination.   °· A CT scan or MRI of your head. These tests may be ordered if symptoms are severe or unusual. °TREATMENT  °Medicines may be given to help relieve symptoms.  °HOME CARE INSTRUCTIONS  °· Only take over-the-counter or prescription medicines for pain or discomfort as directed by your caregiver.   °· Lie down in a dark, quiet room when you have a headache.   °· Keep a journal to find out what may be triggering your headaches. For example, write down: °¨ What you eat and drink. °¨ How much sleep you get. °¨ Any change to your diet or medicines. °· Try massage or other relaxation techniques.   °· Ice packs or heat applied to the head and neck can be used. Use these 3 to 4 times per day for 15 to 20 minutes each time, or as needed.   °· Limit stress.   °· Sit up straight, and do not tense your muscles.    °· Quit smoking if you smoke. °· Limit alcohol use. °· Decrease the amount of caffeine you drink, or stop drinking caffeine. °· Eat and exercise regularly. °· Get 7 to 9 hours of sleep, or as recommended by your caregiver. °· Avoid excessive use of pain medicine as recurrent headaches can occur.    °SEEK MEDICAL CARE IF:  °· You have problems with the medicines you were prescribed. °· Your medicines do not work. °· You have a change from the usual headache. °· You have nausea or vomiting. °SEEK IMMEDIATE MEDICAL CARE IF:  °· Your headache becomes severe. °· You have a fever. °· You have a stiff neck. °· You have loss of vision. °· You have muscular weakness or loss of muscle control. °· You lose your balance or have trouble walking. °· You feel faint or pass out. °· You have severe symptoms that are different from your first symptoms. °MAKE SURE YOU:  °· Understand these instructions. °· Will watch your condition. °· Will get help right away if you are not doing well or get worse. °Document Released: 05/29/2005 Document Revised: 08/21/2011 Document Reviewed: 05/19/2011 °[employer]® Patient Information ©2015 [employer]. This information is not intended to replace advice given to you by your health care provider. Make sure you discuss any questions you have with your health care provider. ° °

## 2013-12-16 NOTE — ED Provider Notes (Signed)
CSN: [id number]     Arrival date & time 12/16/13  [id number] History   First MD Initiated Contact with Patient 12/16/13 0900     Chief Complaint  Patient presents with  . Headache   (Consider location/radiation/quality/duration/timing/severity/associated sxs/prior Treatment) HPI Comments: Female presents complaining of headache. For 6 days she has had a headache across her forehead when she wakes up it goes away after she takes her blood pressure medication. Today the headache was much worse and she was nauseated and did not feel like she can take her blood pressure medication so she decided to come in. The headache is 10 out of 10 in severity, throbbing quality, nonradiating. She has nausea but no actual vomiting. She woke up with a headache, the headache did not wake her up. No fever or stiff neck. No dizziness or lightheadedness, no vertigo. She has a history of migraines and says this is not a migraine.  Patient is a 43 y.o. female presenting with headaches.  Headache Associated symptoms: nausea     Past Medical History  Diagnosis Date  . Diabetes mellitus   . Hypertension   . Asthma   . GERD (gastroesophageal reflux disease)   . Neuropathy   . DDD (degenerative disc disease), cervical    Past Surgical History  Procedure Laterality Date  . Tonsillectomy    . Appendectomy    . Adenoidectomy    . Cervical spine surgery  06/2012    C5-C6   No family history on file. History  Substance Use Topics  . Smoking status: Never Smoker   . Smokeless tobacco: Never Used  . Alcohol Use: No   OB History   Grav Para Term Preterm Abortions TAB SAB Ect Mult Living                 Review of Systems  Gastrointestinal: Positive for nausea.  Neurological: Positive for headaches.  All other systems reviewed and are negative.   Allergies  Aspirin and Penicillins  Home Medications   Prior to Admission medications   Medication Sig Start Date End Date Taking? Authorizing Provider   glimepiride (AMARYL) 2 MG tablet Take 2 mg by mouth every morning.   Yes Historical Provider, MD  insulin aspart (NOVOLOG) 100 UNIT/ML injection Inject 6 Units into the skin 3 (three) times daily before meals.    Yes Historical Provider, MD  insulin glargine (LANTUS) 100 UNIT/ML injection Inject 40 Units into the skin at bedtime.    Yes Historical Provider, MD  olmesartan-hydrochlorothiazide (BENICAR HCT) 40-12.5 MG per tablet Take 1 tablet by mouth daily.   Yes Historical Provider, MD  omeprazole (PRILOSEC) 40 MG capsule Take 40 mg by mouth every evening.   Yes Historical Provider, MD  PROPRANOLOL HCL PO Take by mouth.   Yes Historical Provider, MD  tiZANidine (ZANAFLEX) 4 MG tablet Take 4 mg by mouth at bedtime.    Yes Historical Provider, MD  albuterol (PROVENTIL HFA;VENTOLIN HFA) 108 (90 BASE) MCG/ACT inhaler Inhale 2 puffs into the lungs every 6 (six) hours as needed. For shortness of breath.    Historical Provider, MD  cholecalciferol (VITAMIN D) 1000 UNITS tablet Take 1,000 Units by mouth daily.    Historical Provider, MD  DULoxetine (CYMBALTA) 60 MG capsule Take 60 mg by mouth daily.    Historical Provider, MD  metoprolol succinate (TOPROL-XL) 50 MG 24 hr tablet Take 50 mg by mouth daily. Take with or immediately following a meal.    Historical Provider, MD  oxyCODONE-acetaminophen (  PERCOCET) 10-325 MG per tablet Take 1 tablet by mouth every 8 (eight) hours.     Historical Provider, MD   BP 177/85  Pulse 77  Temp(Src) 98.1 F (36.7 C) (Oral)  Resp 20  Ht 5' 10.5" (1.791 m)  Wt 170 lb (77.111 kg)  BMI 24.04 kg/m2  SpO2 99%  LMP 12/09/2013 Physical Exam  Nursing note and vitals reviewed. Constitutional: She is oriented to person, place, and time. Vital signs are normal. She appears well-developed and well-nourished. No distress.  HENT:  Head: Normocephalic and atraumatic.  Right Ear: External ear normal.  Left Ear: External ear normal.  Nose: Nose normal.  Mouth/Throat:  Oropharynx is clear and moist. No oropharyngeal exudate.  Eyes: Conjunctivae and EOM are normal. Pupils are equal, round, and reactive to light. Right eye exhibits no discharge. Left eye exhibits no discharge.  Neck: Normal range of motion. Neck supple.  Cardiovascular: Normal rate, regular rhythm and normal heart sounds.   Pulmonary/Chest: Effort normal and breath sounds normal. No respiratory distress.  Lymphadenopathy:    She has no cervical adenopathy.  Neurological: She is alert and oriented to person, place, and time. She has normal strength. No cranial nerve deficit or sensory deficit. She exhibits normal muscle tone. She displays a negative Romberg sign. Coordination and gait normal. GCS eye subscore is 4. GCS verbal subscore is 5. GCS motor subscore is 6.  Skin: Skin is warm and dry. No rash noted. She is not diaphoretic.  Psychiatric: She has a normal mood and affect. Judgment normal.    ED Course  Procedures (including critical care time) Labs Review Labs Reviewed  CBC WITH DIFFERENTIAL - Abnormal; Notable for the following:    Hemoglobin 11.7 (*)    HCT 35.0 (*)    All other components within normal limits  COMPREHENSIVE METABOLIC PANEL - Abnormal; Notable for the following:    Glucose, Bld 234 (*)    Total Protein 8.8 (*)    Total Bilirubin 0.2 (*)    All other components within normal limits  POCT I-STAT, CHEM 8 - Abnormal; Notable for the following:    Sodium 135 (*)    Glucose, Bld 241 (*)    All other components within normal limits    Imaging Review No results found.   MDM   1. Tension headache   2. Nausea and vomiting in adult   3. Essential hypertension    We have given the patient 2 L of IV normal saline, IV Zofran, IV Toradol. She had no improvement. She was given oral clonidine which she vomited up. I advised transfer to the emergency department at that point for further evaluation and treatment of her condition. She declines and says she will check out  AMA. I have explained all the risks of this to her in detail, she says she understands and is still checking out AMA.  Meds ordered this encounter  Medications  . PROPRANOLOL HCL PO    Sig: Take by mouth.  . sodium chloride 0.9 % bolus 1,000 mL    Sig:   . ketorolac (TORADOL) 30 MG/ML injection 30 mg    Sig:   . ondansetron (ZOFRAN) injection 4 mg    Sig:   . cloNIDine (CATAPRES) tablet 0.1 mg    Sig:   . 0.9 %  sodium chloride infusion    Sig:       Liam Graham, PA-C 12/16/13 1208

## 2013-12-16 NOTE — ED Provider Notes (Signed)
Medical screening examination/treatment/procedure(s) were performed by resident physician or non-physician practitioner and as supervising physician I was immediately available for consultation/collaboration.   Pauline Good MD.   Billy Fischer, MD 12/16/13 1415

## 2013-12-20 ENCOUNTER — Encounter (HOSPITAL_COMMUNITY): Payer: Self-pay | Admitting: Emergency Medicine

## 2013-12-20 DIAGNOSIS — J45909 Unspecified asthma, uncomplicated: Secondary | ICD-10-CM | POA: Insufficient documentation

## 2013-12-20 DIAGNOSIS — Z8739 Personal history of other diseases of the musculoskeletal system and connective tissue: Secondary | ICD-10-CM | POA: Insufficient documentation

## 2013-12-20 DIAGNOSIS — Z8669 Personal history of other diseases of the nervous system and sense organs: Secondary | ICD-10-CM | POA: Insufficient documentation

## 2013-12-20 DIAGNOSIS — I10 Essential (primary) hypertension: Secondary | ICD-10-CM | POA: Insufficient documentation

## 2013-12-20 DIAGNOSIS — Z880 Allergy status to penicillin: Secondary | ICD-10-CM | POA: Diagnosis not present

## 2013-12-20 DIAGNOSIS — R51 Headache: Secondary | ICD-10-CM | POA: Diagnosis present

## 2013-12-20 DIAGNOSIS — K219 Gastro-esophageal reflux disease without esophagitis: Secondary | ICD-10-CM | POA: Insufficient documentation

## 2013-12-20 DIAGNOSIS — E119 Type 2 diabetes mellitus without complications: Secondary | ICD-10-CM | POA: Diagnosis not present

## 2013-12-20 DIAGNOSIS — G4452 New daily persistent headache (NDPH): Secondary | ICD-10-CM | POA: Insufficient documentation

## 2013-12-20 DIAGNOSIS — Z794 Long term (current) use of insulin: Secondary | ICD-10-CM | POA: Diagnosis not present

## 2013-12-20 DIAGNOSIS — Z79899 Other long term (current) drug therapy: Secondary | ICD-10-CM | POA: Insufficient documentation

## 2013-12-20 NOTE — ED Notes (Signed)
The pt has had a headache for 2 weeks with nv.  Hx of migraine headaches but the pt declares that this is not a migraine headache.  She was seen at [facility] Tuesday.  lmp last week

## 2013-12-21 ENCOUNTER — Emergency Department (HOSPITAL_COMMUNITY): Payer: Medicaid Other

## 2013-12-21 ENCOUNTER — Emergency Department (HOSPITAL_COMMUNITY)
Admission: EM | Admit: 2013-12-21 | Discharge: 2013-12-21 | Disposition: A | Discharge status: Home / Self Care | Payer: Medicaid Other | Attending: Emergency Medicine | Admitting: Emergency Medicine

## 2013-12-21 DIAGNOSIS — G4452 New daily persistent headache (NDPH): Secondary | ICD-10-CM

## 2013-12-21 DIAGNOSIS — I10 Essential (primary) hypertension: Secondary | ICD-10-CM

## 2013-12-21 MED ORDER — AMLODIPINE BESYLATE 10 MG PO TABS: 5.0000 mg | ORAL_TABLET | Freq: Every day | ORAL | Status: DC

## 2013-12-21 MED ORDER — SODIUM CHLORIDE 0.9 % IV BOLUS (SEPSIS)
1000.0000 mL | Freq: Once | INTRAVENOUS | Status: AC
  Administered 2013-12-21: 1000 mL via INTRAVENOUS

## 2013-12-21 MED ORDER — METOCLOPRAMIDE HCL 5 MG/ML IJ SOLN
10.0000 mg | Freq: Once | INTRAMUSCULAR | Status: AC
Start: 2013-12-21 — End: 2013-12-21
  Administered 2013-12-21: 10 mg via INTRAVENOUS
  Filled 2013-12-21: qty 2

## 2013-12-21 MED ORDER — LABETALOL HCL 5 MG/ML IV SOLN
20.0000 mg | Freq: Once | INTRAVENOUS | Status: AC
  Administered 2013-12-21: 20 mg via INTRAVENOUS
  Filled 2013-12-21: qty 4

## 2013-12-21 NOTE — ED Provider Notes (Signed)
CSN: [id number]     Arrival date & time 12/20/13  2130 History   First MD Initiated Contact with Patient 12/21/13 0127     Chief Complaint  Patient presents with  . Headache     (Consider location/radiation/quality/duration/timing/severity/associated sxs/prior Treatment) HPI This patient is a 43 yo woman with a history of DM, HTN, asthma and migraine headaches. She presents with complaint of diffuse, throbbing, pounding headache which is most severe in the occipital region. Pain is 9/10 and constant.  The patient was seen at local urgent care center 5d ago for the same type of headache. She left AMA.   The patient has chronic visual changes secondary to diabetic retinopathy but, has no new visual changes. She has been nauseated and vomiting twice earlier in the week. NO photophobia or phonophobia. Nothing makes headache worse or better. Patient denies history of similar headache. She says that this headache feels completely different in nature compared to migraine headache.   She is noted to be quite hypertensive - 190/105. Although, she reports compliance with all mesd.   Past Medical History  Diagnosis Date  . Diabetes mellitus   . Hypertension   . Asthma   . GERD (gastroesophageal reflux disease)   . Neuropathy   . DDD (degenerative disc disease), cervical    Past Surgical History  Procedure Laterality Date  . Tonsillectomy    . Appendectomy    . Adenoidectomy    . Cervical spine surgery  06/2012    C5-C6   No family history on file. History  Substance Use Topics  . Smoking status: Never Smoker   . Smokeless tobacco: Never Used  . Alcohol Use: No   OB History   Grav Para Term Preterm Abortions TAB SAB Ect Mult Living                 Review of Systems Ten point review of symptoms performed and is negative with the exception of symptoms noted above.    Allergies  Aspirin and Penicillins  Home Medications   Prior to Admission medications   Medication Sig Start  Date End Date Taking? Authorizing Provider  albuterol (PROVENTIL HFA;VENTOLIN HFA) 108 (90 BASE) MCG/ACT inhaler Inhale 2 puffs into the lungs every 6 (six) hours as needed. For shortness of breath.   Yes Historical Provider, MD  cholecalciferol (VITAMIN D) 1000 UNITS tablet Take 1,000 Units by mouth daily.   Yes Historical Provider, MD  glimepiride (AMARYL) 2 MG tablet Take 2 mg by mouth every morning.   Yes Historical Provider, MD  insulin aspart (NOVOLOG) 100 UNIT/ML injection Inject 10 Units into the skin 3 (three) times daily before meals.    Yes Historical Provider, MD  insulin glargine (LANTUS) 100 UNIT/ML injection Inject 50 Units into the skin at bedtime.    Yes Historical Provider, MD  olmesartan-hydrochlorothiazide (BENICAR HCT) 40-12.5 MG per tablet Take 1 tablet by mouth daily.   Yes Historical Provider, MD  omeprazole (PRILOSEC) 40 MG capsule Take 40 mg by mouth every evening.   Yes Historical Provider, MD  oxyCODONE-acetaminophen (PERCOCET) 10-325 MG per tablet Take 1 tablet by mouth every 8 (eight) hours. Endocet   Yes Historical Provider, MD  propranolol ER (INDERAL LA) 60 MG 24 hr capsule Take 60 mg by mouth daily.   Yes Historical Provider, MD  tiZANidine (ZANAFLEX) 4 MG tablet Take 4 mg by mouth 2 (two) times daily.    Yes Historical Provider, MD   BP 181/90  Pulse 88  Temp(Src) 98.6 F (37 C)  Resp 18  Ht 5\' 2"  (1.575 m)  Wt 194 lb (87.998 kg)  BMI 35.47 kg/m2  SpO2 99%  LMP 12/09/2013 Physical Exam Gen: well developed and well nourished appearing, appears mildly uncomfortable Head: NCAT Eyes: PERL, EOMI, no papilledema Nose: no epistaixis or rhinorrhea Mouth/throat: mucosa is moist and pink Neck: supple, no stridor Lungs: CTA B, no wheezing, rhonchi or rales CV: RRR, no murmur, extremities appear well perfused.  Abd: soft, obese, notender, nondistended Back: no ttp, no cva ttp Skin: warm and dry Ext: normal to inspection, no dependent edema Neuro: CN ii-xii  grossly intact, no focal deficits, normal strength in all 4 extremities, normal speech and normal gait. Psyche; normal affect,  calm and cooperative.   ED Course  Procedures (including critical care time) Labs Review  CT Head Wo Contrast (Final result)  Result time: 12/21/13 02:30:18    Final result by Rad Results In Interface (12/21/13 02:30:18)    Narrative:   CLINICAL DATA: Headache for 2 weeks. Nausea and vomiting.  EXAM: CT HEAD WITHOUT CONTRAST  TECHNIQUE: Contiguous axial images were obtained from the base of the skull through the vertex without intravenous contrast.  COMPARISON: 03/07/2012  FINDINGS: Ventricles and sulci appear symmetrical. No mass effect or midline shift. No abnormal extra-axial fluid collections. Gray-white matter junctions are distinct. Basal cisterns are not effaced. No evidence of acute intracranial hemorrhage. No depressed skull fractures. Visualized paranasal sinuses and mastoid air cells are not opacified.  IMPRESSION: No acute intracranial abnormalities.   Electronically Signed By: Lucienne Capers M.D. On: 12/21/2013 02:30         MDM   Patient with hypertensive urgency and headache. We will obtain CT scan, treat symptomatically and lower BP with labetalol. Patient would prefer to avoid opiates.   BP improved after tx with Labetalol with SBP to the 170s. Patient says her SBP typically is in the 160s.   Patient says she still has a headache but, she refuses any pain meds and refuses admission for management of hypertensive urgency. We will add Norvasc 5mg  to her current antihypertensive regimen. The patient has f/u with her PCP in 2 weeks. But, I have asked her to call the office tomorrow for an appt in the next couple of days. We have discused return precautions.   Elyn Peers, MD 12/21/13 [phone]

## 2013-12-21 NOTE — Discharge Instructions (Signed)
Hypertension Hypertension, commonly called high blood pressure, is when the force of blood pumping through your arteries is too strong. Your arteries are the blood vessels that carry blood from your heart throughout your body. A blood pressure reading consists of a higher number over a lower number, such as 110/72. The higher number (systolic) is the pressure inside your arteries when your heart pumps. The lower number (diastolic) is the pressure inside your arteries when your heart relaxes. Ideally you want your blood pressure below 120/80. Hypertension forces your heart to work harder to pump blood. Your arteries may become narrow or stiff. Having hypertension puts you at risk for heart disease, stroke, and other problems.  RISK FACTORS Some risk factors for high blood pressure are controllable. Others are not.  Risk factors you cannot control include:   Race. You may be at higher risk if you are African American.  Age. Risk increases with age.  Gender. Men are at higher risk than women before age 45 years. After age 65, women are at higher risk than men. Risk factors you can control include:  Not getting enough exercise or physical activity.  Being overweight.  Getting too much fat, sugar, calories, or salt in your diet.  Drinking too much alcohol. SIGNS AND SYMPTOMS Hypertension does not usually cause signs or symptoms. Extremely high blood pressure (hypertensive crisis) may cause headache, anxiety, shortness of breath, and nosebleed. DIAGNOSIS  To check if you have hypertension, your health care provider will measure your blood pressure while you are seated, with your arm held at the level of your heart. It should be measured at least twice using the same arm. Certain conditions can cause a difference in blood pressure between your right and left arms. A blood pressure reading that is higher than normal on one occasion does not mean that you need treatment. If one blood pressure reading  is high, ask your health care provider about having it checked again. TREATMENT  Treating high blood pressure includes making lifestyle changes and possibly taking medication. Living a healthy lifestyle can help lower high blood pressure. You may need to change some of your habits. Lifestyle changes may include:  Following the DASH diet. This diet is high in fruits, vegetables, and whole grains. It is low in salt, red meat, and added sugars.  Getting at least 2 1/2 hours of brisk physical activity every week.  Losing weight if necessary.  Not smoking.  Limiting alcoholic beverages.  Learning ways to reduce stress. If lifestyle changes are not enough to get your blood pressure under control, your health care provider may prescribe medicine. You may need to take more than one. Work closely with your health care provider to understand the risks and benefits. HOME CARE INSTRUCTIONS  Have your blood pressure rechecked as directed by your health care provider.   Only take medicine as directed by your health care provider. Follow the directions carefully. Blood pressure medicines must be taken as prescribed. The medicine does not work as well when you skip doses. Skipping doses also puts you at risk for problems.   Do not smoke.   Monitor your blood pressure at home as directed by your health care provider. SEEK MEDICAL CARE IF:   You think you are having a reaction to medicines taken.  You have recurrent headaches or feel dizzy.  You have swelling in your ankles.  You have trouble with your vision. SEEK IMMEDIATE MEDICAL CARE IF:  You develop a severe headache or   confusion.  You have unusual weakness, numbness, or feel faint.  You have severe chest or abdominal pain.  You vomit repeatedly.  You have trouble breathing. MAKE SURE YOU:   Understand these instructions.  Will watch your condition.  Will get help right away if you are not doing well or get  worse. Document Released: 05/29/2005 Document Revised: 06/03/2013 Document Reviewed: 03/21/2013 [employer] Patient Information 2015 [employer]. This information is not intended to replace advice given to you by your health care provider. Make sure you discuss any questions you have with your health care provider.  

## 2015-04-13 ENCOUNTER — Emergency Department (HOSPITAL_COMMUNITY): Payer: Medicaid Other

## 2015-04-13 ENCOUNTER — Emergency Department (HOSPITAL_COMMUNITY)
Admission: EM | Admit: 2015-04-13 | Discharge: 2015-04-13 | Disposition: A | Discharge status: Home / Self Care | Payer: Medicaid Other | Attending: Emergency Medicine | Admitting: Emergency Medicine

## 2015-04-13 ENCOUNTER — Encounter (HOSPITAL_COMMUNITY): Payer: Self-pay | Admitting: *Deleted

## 2015-04-13 DIAGNOSIS — R319 Hematuria, unspecified: Secondary | ICD-10-CM | POA: Diagnosis not present

## 2015-04-13 DIAGNOSIS — I10 Essential (primary) hypertension: Secondary | ICD-10-CM | POA: Diagnosis not present

## 2015-04-13 DIAGNOSIS — Z87442 Personal history of urinary calculi: Secondary | ICD-10-CM | POA: Insufficient documentation

## 2015-04-13 DIAGNOSIS — R6883 Chills (without fever): Secondary | ICD-10-CM | POA: Insufficient documentation

## 2015-04-13 DIAGNOSIS — R109 Unspecified abdominal pain: Secondary | ICD-10-CM

## 2015-04-13 DIAGNOSIS — R110 Nausea: Secondary | ICD-10-CM | POA: Diagnosis not present

## 2015-04-13 DIAGNOSIS — Z8669 Personal history of other diseases of the nervous system and sense organs: Secondary | ICD-10-CM | POA: Diagnosis not present

## 2015-04-13 DIAGNOSIS — J45909 Unspecified asthma, uncomplicated: Secondary | ICD-10-CM | POA: Diagnosis not present

## 2015-04-13 DIAGNOSIS — Z8719 Personal history of other diseases of the digestive system: Secondary | ICD-10-CM | POA: Diagnosis not present

## 2015-04-13 DIAGNOSIS — E119 Type 2 diabetes mellitus without complications: Secondary | ICD-10-CM | POA: Insufficient documentation

## 2015-04-13 DIAGNOSIS — R1032 Left lower quadrant pain: Secondary | ICD-10-CM | POA: Diagnosis not present

## 2015-04-13 DIAGNOSIS — R300 Dysuria: Secondary | ICD-10-CM | POA: Diagnosis not present

## 2015-04-13 DIAGNOSIS — R630 Anorexia: Secondary | ICD-10-CM | POA: Insufficient documentation

## 2015-04-13 DIAGNOSIS — Z880 Allergy status to penicillin: Secondary | ICD-10-CM | POA: Insufficient documentation

## 2015-04-13 LAB — CBC WITH DIFFERENTIAL/PLATELET
BASOS ABS: 0 10*3/uL (ref 0.0–0.1)
BASOS PCT: 0 %
EOS PCT: 1 %
Eosinophils Absolute: 0.1 10*3/uL (ref 0.0–0.7)
HEMATOCRIT: 35.1 % — AB (ref 36.0–46.0)
Hemoglobin: 12 g/dL (ref 12.0–15.0)
Lymphocytes Relative: 30 %
Lymphs Abs: 2.3 10*3/uL (ref 0.7–4.0)
MCH: 28.8 pg (ref 26.0–34.0)
MCHC: 34.2 g/dL (ref 30.0–36.0)
MCV: 84.2 fL (ref 78.0–100.0)
MONO ABS: 0.3 10*3/uL (ref 0.1–1.0)
MONOS PCT: 4 %
Neutro Abs: 4.8 10*3/uL (ref 1.7–7.7)
Neutrophils Relative %: 65 %
PLATELETS: 257 10*3/uL (ref 150–400)
RBC: 4.17 MIL/uL (ref 3.87–5.11)
RDW: 13.2 % (ref 11.5–15.5)
WBC: 7.5 10*3/uL (ref 4.0–10.5)

## 2015-04-13 LAB — URINALYSIS, ROUTINE W REFLEX MICROSCOPIC
Bilirubin Urine: NEGATIVE
KETONES UR: 15 mg/dL — AB
LEUKOCYTES UA: NEGATIVE
NITRITE: NEGATIVE
PH: 5 (ref 5.0–8.0)
Protein, ur: 30 mg/dL — AB
SPECIFIC GRAVITY, URINE: 1.03 (ref 1.005–1.030)
Urobilinogen, UA: 1 mg/dL (ref 0.0–1.0)

## 2015-04-13 LAB — URINE MICROSCOPIC-ADD ON

## 2015-04-13 LAB — BASIC METABOLIC PANEL
Anion gap: 8 (ref 5–15)
BUN: 16 mg/dL (ref 6–20)
CALCIUM: 8.9 mg/dL (ref 8.9–10.3)
CHLORIDE: 103 mmol/L (ref 101–111)
CO2: 24 mmol/L (ref 22–32)
CREATININE: 0.93 mg/dL (ref 0.44–1.00)
Glucose, Bld: 312 mg/dL — ABNORMAL HIGH (ref 65–99)
POTASSIUM: 3.7 mmol/L (ref 3.5–5.1)
SODIUM: 135 mmol/L (ref 135–145)

## 2015-04-13 LAB — POC URINE PREG, ED: Preg Test, Ur: NEGATIVE

## 2015-04-13 MED ORDER — AMLODIPINE BESYLATE 5 MG PO TABS: 5.0000 mg | ORAL_TABLET | Freq: Every day | ORAL | Status: DC

## 2015-04-13 MED ORDER — INSULIN ASPART 100 UNIT/ML ~~LOC~~ SOLN: 10.0000 [IU] | Freq: Three times a day (TID) | SUBCUTANEOUS | Status: DC

## 2015-04-13 MED ORDER — GLIMEPIRIDE 2 MG PO TABS: 2.0000 mg | ORAL_TABLET | Freq: Every day | ORAL | Status: DC

## 2015-04-13 MED ORDER — PROPRANOLOL HCL ER 60 MG PO CP24: 60.0000 mg | ORAL_CAPSULE | Freq: Every day | ORAL | Status: DC

## 2015-04-13 MED ORDER — KETOROLAC TROMETHAMINE 30 MG/ML IJ SOLN
30.0000 mg | Freq: Once | INTRAMUSCULAR | Status: AC
  Administered 2015-04-13: 30 mg via INTRAVENOUS
  Filled 2015-04-13: qty 1

## 2015-04-13 MED ORDER — OLMESARTAN MEDOXOMIL-HCTZ 40-12.5 MG PO TABS: 1.0000 | ORAL_TABLET | Freq: Every day | ORAL | Status: DC

## 2015-04-13 MED ORDER — FENTANYL CITRATE (PF) 100 MCG/2ML IJ SOLN
50.0000 ug | Freq: Once | INTRAMUSCULAR | Status: AC
  Administered 2015-04-13: 50 ug via INTRAVENOUS
  Filled 2015-04-13: qty 2

## 2015-04-13 MED ORDER — INSULIN GLARGINE 100 UNIT/ML ~~LOC~~ SOLN: 50.0000 [IU] | Freq: Every day | SUBCUTANEOUS | Status: DC

## 2015-04-13 MED ORDER — ALBUTEROL SULFATE HFA 108 (90 BASE) MCG/ACT IN AERS: 1.0000 | INHALATION_SPRAY | Freq: Four times a day (QID) | RESPIRATORY_TRACT | Status: DC | PRN

## 2015-04-13 NOTE — ED Notes (Signed)
Await social work to see pt regarding her meds as she has been without them for economic reasons

## 2015-04-13 NOTE — Discharge Instructions (Signed)
Flank Pain °Flank pain refers to pain that is located on the side of the body between the upper abdomen and the back. The pain may occur over a short period of time (acute) or may be long-term or reoccurring (chronic). It may be mild or severe. Flank pain can be caused by many things. °CAUSES  °Some of the more common causes of flank pain include: °· Muscle strains.   °· Muscle spasms.   °· A disease of your spine (vertebral disk disease).   °· A lung infection (pneumonia).   °· Fluid around your lungs (pulmonary edema).   °· A kidney infection.   °· Kidney stones.   °· A very painful skin rash caused by the chickenpox virus (shingles).   °· Gallbladder disease.   °HOME CARE INSTRUCTIONS  °Home care will depend on the cause of your pain. In general, °· Rest as directed by your caregiver. °· Drink enough fluids to keep your urine clear or pale yellow. °· Only take over-the-counter or prescription medicines as directed by your caregiver. Some medicines may help relieve the pain. °· Tell your caregiver about any changes in your pain. °· Follow up with your caregiver as directed. °SEEK IMMEDIATE MEDICAL CARE IF:  °· Your pain is not controlled with medicine.   °· You have new or worsening symptoms. °· Your pain increases.   °· You have abdominal pain.   °· You have shortness of breath.   °· You have persistent nausea or vomiting.   °· You have swelling in your abdomen.   °· You feel faint or pass out.   °· You have blood in your urine. °· You have a fever or persistent symptoms for more than 2-3 days. °· You have a fever and your symptoms suddenly get worse. °MAKE SURE YOU:  °· Understand these instructions. °· Will watch your condition. °· Will get help right away if you are not doing well or get worse. °  °This information is not intended to replace advice given to you by your health care provider. Make sure you discuss any questions you have with your health care provider. °  °Document Released: 07/20/2005 Document  Revised: 02/21/2012 Document Reviewed: 01/11/2012 °Elsevier Interactive Patient Education ©2016 [employer] ° °

## 2015-04-13 NOTE — ED Notes (Signed)
Pt here with left flank and lower abdominal pain since this am.  Pt has pain with urination also.  Pt has nausea with this.

## 2015-04-13 NOTE — ED Provider Notes (Signed)
CSN: [id number]     Arrival date & time 04/13/15  [id number] History   First MD Initiated Contact with Patient 04/13/15 0757     Chief Complaint  Patient presents with  . Flank Pain     (Consider location/radiation/quality/duration/timing/severity/associated sxs/prior Treatment) Patient is a 44 y.o. female presenting with flank pain.  Flank Pain Associated symptoms include abdominal pain. Pertinent negatives include no chest pain and no shortness of breath.   patient presents with left flank pain. Began this morning. His had nausea. His had dysuria. States she's had chills. The pain is in her left flank and goes down her abdomen. States she's been told she has kidney stones before. No vaginal bleeding or discharge. Patient states she felt fine yesterday. No trauma. Denies possibility of pregnancy. She is diabetic but states she's been off her medicines for a while. States she has had problems with Medicaid and her doctor.  Past Medical History  Diagnosis Date  . Diabetes mellitus   . Hypertension   . Asthma   . GERD (gastroesophageal reflux disease)   . Neuropathy ([place])   . DDD (degenerative disc disease), cervical    Past Surgical History  Procedure Laterality Date  . Tonsillectomy    . Appendectomy    . Adenoidectomy    . Cervical spine surgery  06/2012    C5-C6  . Shoulder surgery     No family history on file. Social History  Substance Use Topics  . Smoking status: Never Smoker   . Smokeless tobacco: Never Used  . Alcohol Use: No   OB History    No data available     Review of Systems  Constitutional: Positive for chills and appetite change. Negative for fever.  HENT: Negative for facial swelling.   Respiratory: Negative for shortness of breath.   Cardiovascular: Negative for chest pain.  Gastrointestinal: Positive for abdominal pain.  Genitourinary: Positive for flank pain.  Musculoskeletal: Negative for back pain.  Neurological: Negative for numbness.       Allergies  Aspirin and Penicillins  Home Medications   Prior to Admission medications   Medication Sig Start Date End Date Taking? Authorizing Provider  albuterol (PROVENTIL HFA;VENTOLIN HFA) 108 (90 BASE) MCG/ACT inhaler Inhale 1-2 puffs into the lungs every 6 (six) hours as needed for wheezing or shortness of breath. 04/13/15   Davonna Belling, MD  amLODipine (NORVASC) 5 MG tablet Take 1 tablet (5 mg total) by mouth daily. 04/13/15   Davonna Belling, MD  glimepiride (AMARYL) 2 MG tablet Take 1 tablet (2 mg total) by mouth daily with breakfast. 04/13/15   Davonna Belling, MD  insulin aspart (NOVOLOG) 100 UNIT/ML injection Inject 10 Units into the skin 3 (three) times daily before meals. 04/13/15   Davonna Belling, MD  insulin glargine (LANTUS) 100 UNIT/ML injection Inject 0.5 mLs (50 Units total) into the skin at bedtime. 04/13/15   Davonna Belling, MD  olmesartan-hydrochlorothiazide (BENICAR HCT) 40-12.5 MG tablet Take 1 tablet by mouth daily. 04/13/15   Davonna Belling, MD  propranolol ER (INDERAL LA) 60 MG 24 hr capsule Take 1 capsule (60 mg total) by mouth daily. 04/13/15   Davonna Belling, MD   BP 189/97 mmHg  Pulse 79  Temp(Src) 98.1 F (36.7 C) (Oral)  Resp 12  Ht 5\' 3"  (1.6 m)  Wt 190 lb (86.183 kg)  BMI 33.67 kg/m2  SpO2 100%  LMP 04/03/2015 Physical Exam  Constitutional: She appears well-developed.  HENT:  Head: Normocephalic.  Neck: Neck supple.  Cardiovascular: Normal rate.   Pulmonary/Chest: Effort normal.  Abdominal: There is tenderness.  Left-sided abdominal tenderness, worse in left lower quadrant.  Genitourinary:  Left CVA tenderness.  Musculoskeletal: She exhibits no edema.  Skin: Skin is warm.  Psychiatric: She has a normal mood and affect.    ED Course  Procedures (including critical care time) Labs Review Labs Reviewed  URINALYSIS, ROUTINE W REFLEX MICROSCOPIC (NOT AT [facility]) - Abnormal; Notable for the following:    Glucose, UA >1000 (*)     Hgb urine dipstick SMALL (*)    Ketones, ur 15 (*)    Protein, ur 30 (*)    All other components within normal limits  BASIC METABOLIC PANEL - Abnormal; Notable for the following:    Glucose, Bld 312 (*)    All other components within normal limits  CBC WITH DIFFERENTIAL/PLATELET - Abnormal; Notable for the following:    HCT 35.1 (*)    All other components within normal limits  URINE MICROSCOPIC-ADD ON  POC URINE PREG, ED    Imaging Review Ct Renal Stone Study  04/13/2015  CLINICAL DATA:  Dysuria, nausea, LEFT flank pain, personal history kidney stones, hypertension, diabetes mellitus EXAM: CT ABDOMEN AND PELVIS WITHOUT CONTRAST TECHNIQUE: Multidetector CT imaging of the abdomen and pelvis was performed following the standard protocol without IV contrast. Sagittal and coronal MPR images reconstructed from axial data set. Oral contrast not administered for this indication COMPARISON:  None FINDINGS: Linear subsegmental atelectasis LEFT lower lobe. Nonobstructing 7 mm RIGHT renal calculus inferior pole image 36. Numerous pelvic phleboliths. 2 mm calcification identified at the posterior bladder LEFT of midline, uncertain if represents a phlebolith, gallbladder wall calcification or less likely a vesicular calculus ; lack of fat planes at the posterior margin of the bladder limit localization. Suspect this is slightly medial to the expected position of the LEFT ureterovesical junction. Liver, gallbladder, spleen, pancreas, kidneys and adrenal glands otherwise unremarkable. Stomach decompressed with prominent wall, question artifact from underdistention. Bowel loops otherwise unremarkable. Appendix not localized. Few scattered atherosclerotic calcifications. Scattered normal size LEFT periaortic lymph nodes without discrete abdominal adenopathy. No mass, free air, free fluid or hernia. Bones unremarkable. IMPRESSION: Nonobstructing 7 mm calculus inferior pole RIGHT kidney. No hydronephrosis or  hydroureter. Single 2 mm calcification at the posterior aspect of the urinary bladder LEFT of midline, uncertain if represents phlebolith, bladder wall calculus or less likely vesicular calculus; this is likely medial to the expected position of the LEFT ureterovesical junction, which is not adequately localized and not completely excluded ; correlation with urinalysis recommended to exclude non obstructing urinary tract calculus. Electronically Signed   By: Lavonia Dana M.D.   On: 04/13/2015 08:53   I have personally reviewed and evaluated these images and lab results as part of my medical decision-making.   EKG Interpretation None      MDM   Final diagnoses:  Left flank pain    Patient with left flank pain. Slight hematuria. Crampy. Questionable calcification in bladder. Lab work reassuring otherwise. Has been off her medications. Seen by social work was arrange some follow-up. Given prescriptions for her chronic medications. Discharge home.    Davonna Belling, MD 04/13/15 1210

## 2015-04-13 NOTE — ED Notes (Signed)
Pt has been out of medication, she mentioned this to me when I noted her high BP,  She states that she is awaiting medicaid reinstatement to help her get her meds

## 2015-04-13 NOTE — Care Management Note (Addendum)
Case Management Note  Patient Details  Name: Christina Orozco MRN: [id number] Date of Birth: 10/30/1970  Subjective/Objective:                  43 yo female in ER with abdominal pain.  //Home alone.  Action/Plan: Follow for disposition needs.   Expected Discharge Date:        04/13/2015          Expected Discharge Plan:  Home/Self Care  In-House Referral:  PCP / [facility]  Discharge planning Services  [facility], CM Consult  Post Acute Care Choice:  NA Choice offered to:  Patient  DME Arranged:  N/A DME Agency:  NA  HH Arranged:  NA HH Agency:  NA  Status of Service:  Completed, signed off  Medicare Important Message Given:    Date Medicare IM Given:    Medicare IM give by:    Date Additional Medicare IM Given:    Additional Medicare Important Message give by:     If discussed at Long Length of Stay Meetings, dates discussed:    Additional Comments: Camellia J. Wood, RN, BSN, NCM [phone] ED CM consulted regarding PCP establishment and insurance enrollment. Pt presented to [facility] ED today with flank pain. NCM met with pt at bedside; pt confirms not having access to f/u care with PCP or insurance coverage. Discussed with patient importance and benefits of establishing PCP, and not utilizing the ED for primary care needs. Pt verbalized understanding and is in agreement. Discussed other options, provided list of local  affordable PCPs.  Pt voiced [facility].  NCM advised that [facility]  Internal Medicine providers are seeing pts at [facility]. Pt verbalized understanding. NCM set up appointment with Lachina Hollis, NP at 0900. Wood, Camellia, RN 04/13/2015, 11:23 AM  

## 2015-05-05 ENCOUNTER — Encounter: Payer: Self-pay | Admitting: Family Medicine

## 2015-05-05 ENCOUNTER — Ambulatory Visit (INDEPENDENT_AMBULATORY_CARE_PROVIDER_SITE_OTHER): Payer: Medicaid Other | Admitting: Family Medicine

## 2015-05-05 VITALS — BP 174/106 | HR 80 | Temp 98.1°F | Resp 16 | Ht 63.0 in | Wt 172.0 lb

## 2015-05-05 DIAGNOSIS — I10 Essential (primary) hypertension: Secondary | ICD-10-CM

## 2015-05-05 DIAGNOSIS — Z1239 Encounter for other screening for malignant neoplasm of breast: Secondary | ICD-10-CM

## 2015-05-05 DIAGNOSIS — E119 Type 2 diabetes mellitus without complications: Secondary | ICD-10-CM | POA: Diagnosis not present

## 2015-05-05 DIAGNOSIS — E559 Vitamin D deficiency, unspecified: Secondary | ICD-10-CM | POA: Insufficient documentation

## 2015-05-05 DIAGNOSIS — Z0000 Encounter for general adult medical examination without abnormal findings: Secondary | ICD-10-CM

## 2015-05-05 DIAGNOSIS — Z23 Encounter for immunization: Secondary | ICD-10-CM

## 2015-05-05 DIAGNOSIS — M25511 Pain in right shoulder: Secondary | ICD-10-CM

## 2015-05-05 DIAGNOSIS — I119 Hypertensive heart disease without heart failure: Secondary | ICD-10-CM | POA: Insufficient documentation

## 2015-05-05 LAB — POCT URINALYSIS DIP (DEVICE)
BILIRUBIN URINE: NEGATIVE
Glucose, UA: >=1000 mg/dL — AB
Ketones, ur: NEGATIVE mg/dL
Leukocytes, UA: NEGATIVE
NITRITE: NEGATIVE
PH: 5 (ref 5.0–8.0)
Protein, ur: 30 mg/dL — AB
Specific Gravity, Urine: 1.015 (ref 1.005–1.030)
UROBILINOGEN UA: 0.2 mg/dL (ref 0.0–1.0)

## 2015-05-05 LAB — CBC WITH DIFFERENTIAL/PLATELET
BASOS PCT: 1 % (ref 0–1)
Basophils Absolute: 0.1 10*3/uL (ref 0.0–0.1)
Eosinophils Absolute: 0.1 10*3/uL (ref 0.0–0.7)
Eosinophils Relative: 2 % (ref 0–5)
HEMATOCRIT: 37.5 % (ref 36.0–46.0)
HEMOGLOBIN: 12.6 g/dL (ref 12.0–15.0)
LYMPHS PCT: 31 % (ref 12–46)
Lymphs Abs: 2 10*3/uL (ref 0.7–4.0)
MCH: 28.8 pg (ref 26.0–34.0)
MCHC: 33.6 g/dL (ref 30.0–36.0)
MCV: 85.6 fL (ref 78.0–100.0)
MONO ABS: 0.3 10*3/uL (ref 0.1–1.0)
MONOS PCT: 4 % (ref 3–12)
MPV: 10 fL (ref 8.6–12.4)
NEUTROS ABS: 4.1 10*3/uL (ref 1.7–7.7)
NEUTROS PCT: 62 % (ref 43–77)
Platelets: 345 10*3/uL (ref 150–400)
RBC: 4.38 MIL/uL (ref 3.87–5.11)
RDW: 14.3 % (ref 11.5–15.5)
WBC: 6.6 10*3/uL (ref 4.0–10.5)

## 2015-05-05 LAB — COMPLETE METABOLIC PANEL WITH GFR
ALBUMIN: 3.8 g/dL (ref 3.6–5.1)
ALK PHOS: 68 U/L (ref 33–115)
ALT: 6 U/L (ref 6–29)
AST: 8 U/L — AB (ref 10–30)
BUN: 14 mg/dL (ref 7–25)
CHLORIDE: 101 mmol/L (ref 98–110)
CO2: 24 mmol/L (ref 20–31)
Calcium: 8.9 mg/dL (ref 8.6–10.2)
Creat: 0.89 mg/dL (ref 0.50–1.10)
GFR, EST NON AFRICAN AMERICAN: 81 mL/min (ref >=60)
GFR, Est African American: >89 mL/min (ref >=60)
GLUCOSE: 311 mg/dL — AB (ref 65–99)
POTASSIUM: 4.3 mmol/L (ref 3.5–5.3)
SODIUM: 134 mmol/L — AB (ref 135–146)
Total Bilirubin: 0.4 mg/dL (ref 0.2–1.2)
Total Protein: 7.3 g/dL (ref 6.1–8.1)

## 2015-05-05 LAB — GLUCOSE, CAPILLARY: GLUCOSE-CAPILLARY: 293 mg/dL — AB (ref 65–99)

## 2015-05-05 LAB — HEMOGLOBIN A1C
Hgb A1c MFr Bld: 13.1 % — ABNORMAL HIGH (ref <5.7)
MEAN PLASMA GLUCOSE: 329 mg/dL — AB (ref <117)

## 2015-05-05 MED ORDER — LOSARTAN POTASSIUM 100 MG PO TABS: 100.0000 mg | ORAL_TABLET | Freq: Every day | ORAL | Status: DC

## 2015-05-05 MED ORDER — AMLODIPINE BESYLATE 5 MG PO TABS: 5.0000 mg | ORAL_TABLET | Freq: Every day | ORAL | Status: DC

## 2015-05-05 MED ORDER — GLUCOSE BLOOD VI STRP: ORAL_STRIP | Status: DC

## 2015-05-05 MED ORDER — INSULIN DETEMIR 100 UNIT/ML ~~LOC~~ SOLN: 15.0000 [IU] | Freq: Two times a day (BID) | SUBCUTANEOUS | Status: DC

## 2015-05-05 MED ORDER — LANCET DEVICE MISC: 1.0000 | Freq: Three times a day (TID) | Status: DC

## 2015-05-05 MED ORDER — TRUE METRIX AIR GLUCOSE METER DEVI: 1.0000 | Freq: Three times a day (TID) | Status: DC

## 2015-05-05 NOTE — Progress Notes (Signed)
Subjective:    Patient ID: Christina Orozco, female    DOB: 1970-08-05, 44 y.o.   MRN: [id number]  HPI  [name], a 44 year old female that presents to establish care. 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 She has a history of uncontrolled diabetes and hypertension. She has been off of medication regimen for greater than 1 year.  Patient has a history of type 2 diabetes mellitus. Patient reports lower extremity neuropathy, but denies foot ulcerations, increased appetite, nausea, polydipsia, visual disturbances, vomiting, or weight loss. . She no longer checks blood sugars at home due to lack of glucometer supplies. She states that she was followed by Dr. Darliss Ridgel at [facility] previously, but did not follow up due to financial constraints and loss of insurance benefits. Patient also has a history of uncontrolled hypertension. She has been without medications for over a year. She is not exercising or following a low sodium diet. She does not check blood pressure at home.   Patient denies dizziness, chest pain, dyspnea, irregular heart beat, lower extremity edema, palpitations and tachypnea.    Past Medical History  Diagnosis Date  . Diabetes mellitus   . Hypertension   . Asthma   . GERD (gastroesophageal reflux disease)   . Neuropathy ([place])   . DDD (degenerative disc disease), cervical   . Diabetes mellitus ([place])   . Hypertension    Social History   Social History  . Marital Status: Single    Spouse Name: N/A  . Number of Children: N/A  . Years of Education: N/A   Occupational History  . Not on file.   Social History Main Topics  . Smoking status: Never Smoker   . Smokeless tobacco: Never Used  . Alcohol Use: No  . Drug Use: No  . Sexual Activity: Not on file   Other Topics Concern  . Not on file   Social History Narrative   Immunization History  Administered Date(s)  Administered  . Influenza,inj,Quad PF,36+ Mos 05/05/2015  . Pneumococcal Polysaccharide-23 05/05/2015   Past Surgical History  Procedure Laterality Date  . Tonsillectomy    . Appendectomy    . Adenoidectomy    . Cervical spine surgery  06/2012    C5-C6  . Shoulder surgery     Review of Systems  Constitutional: Positive for fatigue.  HENT: Negative.   Eyes: Negative.   Respiratory: Negative.   Cardiovascular: Negative.   Gastrointestinal: Negative.   Endocrine: Positive for polydipsia and polyphagia. Negative for polyuria.  Genitourinary: Negative.  Negative for urgency, frequency and flank pain.  Musculoskeletal: Negative.  Negative for myalgias.  Skin: Negative.   Allergic/Immunologic: Negative.  Negative for immunocompromised state.  Neurological: Positive for numbness.  Hematological: Negative.   Psychiatric/Behavioral: Negative for suicidal ideas and sleep disturbance. The patient is nervous/anxious.        Objective:   Physical Exam  Constitutional: She is oriented to person, place, and time. She appears well-developed and well-nourished.  HENT:  Head: Normocephalic and atraumatic.  Right Ear: External ear normal.  Left Ear: External ear normal.  Nose: Nose normal.  Mouth/Throat: Oropharynx is clear and moist.  Eyes: Conjunctivae and EOM are normal. Pupils are equal, round, and reactive to light.  Neck: Normal range of motion. Neck supple.  Cardiovascular: Normal rate, regular rhythm, normal heart sounds and intact distal pulses.   Pulmonary/Chest: Effort normal and breath sounds normal.  Abdominal: Soft.  Bowel sounds are normal.  Musculoskeletal: Normal range of motion.  Neurological: She is alert and oriented to person, place, and time. She has normal reflexes.  Skin: Skin is warm.  Psychiatric: She has a normal mood and affect. Her behavior is normal. Judgment and thought content normal.      BP 174/106 mmHg  Pulse 80  Temp(Src) 98.1 F (36.7 C) (Oral)   Resp 16  Ht 5\' 3"  (1.6 m)  Wt 172 lb (78.019 kg)  BMI 30.48 kg/m2  LMP 04/26/2015 Assessment & Plan:   1. Type 2 diabetes mellitus without complication, without long-term current use of insulin ([place]) Will restart insulin. Patient will start Levemir 15 units BID. Will check fasting blood sugar, 2 hours post prandial, and HS. She will bring glucometer and blood glucose diary to 1 month follow up appointment. Recommend a lowfat, low carbohydrate diet divided over 5-6 small meals, increase water intake to 6-8 glasses, and 150 minutes per week of cardiovascular exercise.  - POCT urinalysis dipstick - Glucose (CBG) - Hemoglobin A1c - COMPLETE METABOLIC PANEL WITH GFR - CBC with Differential - insulin detemir (LEVEMIR) 100 UNIT/ML injection; Inject 0.15 mLs (15 Units total) into the skin 2 (two) times daily.  Dispense: 10 mL; Refill: 11 - Blood Glucose Monitoring Suppl (TRUE METRIX AIR GLUCOSE METER) DEVI; 1 each by Does not apply route 4 (four) times daily -  before meals and at bedtime.  Dispense: 1 Device; Refill: 0 - glucose blood (TRUE METRIX BLOOD GLUCOSE TEST) test strip; Use as instructed  Dispense: 100 each; Refill: 12 - Lancet Device MISC; 1 each by Does not apply route 4 (four) times daily -  before meals and at bedtime.  Dispense: 1 each; Refill: 11  2. Essential hypertension Will re-start blood pressure medications today. The patient is asked to make an attempt to improve diet and exercise patterns to aid in medical management of this problem. - POCT urinalysis dipstick - COMPLETE METABOLIC PANEL WITH GFR - losartan (COZAAR) 100 MG tablet; Take 1 tablet (100 mg total) by mouth daily.  Dispense: 30 tablet; Refill: 0 - amLODipine (NORVASC) 5 MG tablet; Take 1 tablet (5 mg total) by mouth daily.  Dispense: 30 tablet; Refill: 0  3. Vitamin D deficiency - Vitamin D, 25-hydroxy  4. Immunization due - Pneumococcal polysaccharide vaccine 23-valent greater than or equal to 2yo  subcutaneous/IM  5. Need for immunization against influenza - Flu Vaccine QUAD 36+ mos IM (Fluarix)  Routine Health Maintenance: Patient goes to [facility] for right shoulder pain Return to [facility] for eye exam Will schedule follow up for pap smear Will send referral for screening mammogram  The patient was given clear instructions to go to ER or return to medical center if symptoms do not improve, worsen or new problems develop. The patient verbalized understanding. Will notify patient with laboratory results.  Dorena Dew, FNP

## 2015-05-05 NOTE — Patient Instructions (Addendum)
Start Levemir 15 units twice daily. Check blood sugars (am fasting, 2 hours after meals, and at bed time). Refrain from using insulin with blood sugars <80. Recommend a lowfat, low carbohydrate diet divided over 5-6 small meals, increase water intake to 6-8 glasses, and 150 minutes per week of cardiovascular exercise.   Bring glucometer to follow up appointment.  Check blood pressure at home    Diabetes and Standards of Medical Care Diabetes is complicated. You may find that your diabetes team includes a dietitian, nurse, diabetes educator, eye doctor, and more. To help everyone know what is going on and to help you get the care you deserve, the following schedule of care was developed to help keep you on track. Below are the tests, exams, vaccines, medicines, education, and plans you will need. HbA1c test This test shows how well you have controlled your glucose over the past 2-3 months. It is used to see if your diabetes management plan needs to be adjusted.   It is performed at least 2 times a year if you are meeting treatment goals.  It is performed 4 times a year if therapy has changed or if you are not meeting treatment goals. Blood pressure test  This test is performed at every routine medical visit. The goal is less than 140/90 mm Hg for most people, but 130/80 mm Hg in some cases. Ask your health care provider about your goal. Dental exam  Follow up with the dentist regularly. Eye exam  If you are diagnosed with type 1 diabetes as a child, get an exam upon reaching the age of 19 years or older and having had diabetes for 3-5 years. Yearly eye exams are recommended after that initial eye exam.  If you are diagnosed with type 1 diabetes as an adult, get an exam within 5 years of diagnosis and then yearly.  If you are diagnosed with type 2 diabetes, get an exam as soon as possible after the diagnosis and then yearly. Foot care exam  Visual foot exams are performed at every  routine medical visit. The exams check for cuts, injuries, or other problems with the feet.  You should have a complete foot exam performed every year. This exam includes an inspection of the structure and skin of your feet, a check of the pulses in your feet, and a check of the sensation in your feet.  Type 1 diabetes: The first exam is performed 5 years after diagnosis.  Type 2 diabetes: The first exam is performed at the time of diagnosis.  Check your feet nightly for cuts, injuries, or other problems with your feet. Tell your health care provider if anything is not healing. Kidney function test (urine microalbumin)  This test is performed once a year.  Type 1 diabetes: The first test is performed 5 years after diagnosis.  Type 2 diabetes: The first test is performed at the time of diagnosis.  A serum creatinine and estimated glomerular filtration rate (eGFR) test is done once a year to assess the level of chronic kidney disease (CKD), if present. Lipid profile (cholesterol, HDL, LDL, triglycerides)  Performed every 5 years for most people.  The goal for LDL is less than 100 mg/dL. If you are at high risk, the goal is less than 70 mg/dL.  The goal for HDL is 40 mg/dL-50 mg/dL for men and 50 mg/dL-60 mg/dL for women. An HDL cholesterol of 60 mg/dL or higher gives some protection against heart disease.  The goal for  triglycerides is less than 150 mg/dL. Immunizations  The flu (influenza) vaccine is recommended yearly for every person 73 months of age or older who has diabetes.  The pneumonia (pneumococcal) vaccine is recommended for every person 30 years of age or older who has diabetes. Adults 53 years of age or older may receive the pneumonia vaccine as a series of two separate shots.  The hepatitis B vaccine is recommended for adults shortly after they have been diagnosed with diabetes.  The Tdap (tetanus, diphtheria, and pertussis) vaccine should be given:  According to normal  childhood vaccination schedules, for children.  Every 10 years, for adults who have diabetes. Diabetes self-management education  Education is recommended at diagnosis and ongoing as needed. Treatment plan  Your treatment plan is reviewed at every medical visit.   This information is not intended to replace advice given to you by your health care provider. Make sure you discuss any questions you have with your health care provider.   Document Released: 03/26/2009 Document Revised: 06/19/2014 Document Reviewed: 10/29/2012 Elsevier Interactive Patient Education 2016 [place] DASH stands for "Dietary Approaches to Stop Hypertension." The DASH eating plan is a healthy eating plan that has been shown to reduce high blood pressure (hypertension). Additional health benefits may include reducing the risk of type 2 diabetes mellitus, heart disease, and stroke. The DASH eating plan may also help with weight loss. WHAT DO I NEED TO KNOW ABOUT THE DASH EATING PLAN? For the DASH eating plan, you will follow these general guidelines:  Choose foods with a percent daily value for sodium of less than 5% (as listed on the food label).  Use salt-free seasonings or herbs instead of table salt or sea salt.  Check with your health care provider or pharmacist before using salt substitutes.  Eat lower-sodium products, often labeled as "lower sodium" or "no salt added."  Eat fresh foods.  Eat more vegetables, fruits, and low-fat dairy products.  Choose whole grains. Look for the word "whole" as the first word in the ingredient list.  Choose fish and skinless chicken or [place] more often than red meat. Limit fish, poultry, and meat to 6 oz (170 g) each day.  Limit sweets, desserts, sugars, and sugary drinks.  Choose heart-healthy fats.  Limit cheese to 1 oz (28 g) per day.  Eat more home-cooked food and less restaurant, buffet, and fast food.  Limit fried foods.  Cook foods  using methods other than frying.  Limit canned vegetables. If you do use them, rinse them well to decrease the sodium.  When eating at a restaurant, ask that your food be prepared with less salt, or no salt if possible. WHAT FOODS CAN I EAT? Seek help from a dietitian for individual calorie needs. Grains Whole grain or whole wheat bread. Brown rice. Whole grain or whole wheat pasta. Quinoa, bulgur, and whole grain cereals. Low-sodium cereals. Corn or whole wheat flour tortillas. Whole grain cornbread. Whole grain crackers. Low-sodium crackers. Vegetables Fresh or frozen vegetables (raw, steamed, roasted, or grilled). Low-sodium or reduced-sodium tomato and vegetable juices. Low-sodium or reduced-sodium tomato sauce and paste. Low-sodium or reduced-sodium canned vegetables.  Fruits All fresh, canned (in natural juice), or frozen fruits. Meat and Other Protein Products Ground beef (85% or leaner), grass-fed beef, or beef trimmed of fat. Skinless chicken or [place]. Ground chicken or [place]. Pork trimmed of fat. All fish and seafood. Eggs. Dried beans, peas, or lentils. Unsalted nuts and seeds. Unsalted canned beans. Dairy  Low-fat dairy products, such as skim or 1% milk, 2% or reduced-fat cheeses, low-fat ricotta or cottage cheese, or plain low-fat yogurt. Low-sodium or reduced-sodium cheeses. Fats and Oils Tub margarines without trans fats. Light or reduced-fat mayonnaise and salad dressings (reduced sodium). Avocado. Safflower, olive, or canola oils. Natural peanut or almond butter. Other Unsalted popcorn and pretzels. The items listed above may not be a complete list of recommended foods or beverages. Contact your dietitian for more options. WHAT FOODS ARE NOT RECOMMENDED? Grains White bread. White pasta. White rice. Refined cornbread. Bagels and croissants. Crackers that contain trans fat. Vegetables Creamed or fried vegetables. Vegetables in a cheese sauce. Regular canned vegetables.  Regular canned tomato sauce and paste. Regular tomato and vegetable juices. Fruits Dried fruits. Canned fruit in light or heavy syrup. Fruit juice. Meat and Other Protein Products Fatty cuts of meat. Ribs, chicken wings, bacon, sausage, bologna, salami, chitterlings, fatback, hot dogs, bratwurst, and packaged luncheon meats. Salted nuts and seeds. Canned beans with salt. Dairy Whole or 2% milk, cream, half-and-half, and cream cheese. Whole-fat or sweetened yogurt. Full-fat cheeses or blue cheese. Nondairy creamers and whipped toppings. Processed cheese, cheese spreads, or cheese curds. Condiments Onion and garlic salt, seasoned salt, table salt, and sea salt. Canned and packaged gravies. Worcestershire sauce. Tartar sauce. Barbecue sauce. Teriyaki sauce. Soy sauce, including reduced sodium. Steak sauce. Fish sauce. Oyster sauce. Cocktail sauce. Horseradish. Ketchup and mustard. Meat flavorings and tenderizers. Bouillon cubes. Hot sauce. Tabasco sauce. Marinades. Taco seasonings. Relishes. Fats and Oils Butter, stick margarine, lard, shortening, ghee, and bacon fat. Coconut, palm kernel, or palm oils. Regular salad dressings. Other Pickles and olives. Salted popcorn and pretzels. The items listed above may not be a complete list of foods and beverages to avoid. Contact your dietitian for more information. WHERE CAN I FIND MORE INFORMATION? National Heart, Lung, and Blood Institute: travelstabloid.com   This information is not intended to replace advice given to you by your health care provider. Make sure you discuss any questions you have with your health care provider.   Document Released: 05/18/2011 Document Revised: 06/19/2014 Document Reviewed: 04/02/2013 Elsevier Interactive Patient Education 2016 [place]. Daily Diabetes Record Check your blood glucose (BG) as directed by your health care provider. Use this form to record your results as well as any  diabetes medicines you take, including insulin. Checking your BG, recording it, and bringing your records to your health care provider is very helpful in managing your diabetes. These numbers help your health care provider know if any changes are needed to your diabetes plan.  Week of _____________________________ Date: _________  Elita Boone, BG/Medicines: ________________ / __________________________________________________________  LUNCH, BG/Medicines: ____________________ / __________________________________________________________  Wonda Cheng, BG/Medicines: ___________________ / __________________________________________________________  BEDTIME, BG/Medicines: __________________ / __________________________________________________________ Date: _________  Elita Boone, BG/Medicines: ________________ / __________________________________________________________  LUNCH, BG/Medicines: ____________________ / __________________________________________________________  Wonda Cheng, BG/Medicines: ___________________ / __________________________________________________________  BEDTIME, BG/Medicines: __________________ / __________________________________________________________ Date: _________  Elita Boone, BG/Medicines: ________________ / __________________________________________________________  LUNCH, BG/Medicines: ____________________ / __________________________________________________________  Wonda Cheng, BG/Medicines: ___________________ / __________________________________________________________  BEDTIME, BG/Medicines: __________________ / __________________________________________________________ Date: _________  Elita Boone, BG/Medicines: ________________ / __________________________________________________________  LUNCH, BG/Medicines: ____________________ / __________________________________________________________  Wonda Cheng, BG/Medicines: ___________________ /  __________________________________________________________  BEDTIME, BG/Medicines: __________________ / __________________________________________________________ Date: _________  Elita Boone, BG/Medicines: ________________ / __________________________________________________________  LUNCH, BG/Medicines: ____________________ / __________________________________________________________  Wonda Cheng, BG/Medicines: ___________________ / __________________________________________________________  BEDTIME, BG/Medicines: __________________ / __________________________________________________________ Date: _________  Elita Boone, BG/Medicines: ________________ / __________________________________________________________  LUNCH, BG/Medicines: ____________________ / __________________________________________________________  Wonda Cheng, BG/Medicines: ___________________ / __________________________________________________________  BEDTIME, BG/Medicines:  __________________ / __________________________________________________________ Date: _________  Elita Boone, BG/Medicines: ________________ / __________________________________________________________  LUNCH, BG/Medicines: ____________________ / __________________________________________________________  Wonda Cheng, BG/Medicines: ___________________ / __________________________________________________________  BEDTIME, BG/Medicines: __________________ / __________________________________________________________ Notes: __________________________________________________________________________________________________   This information is not intended to replace advice given to you by your health care provider. Make sure you discuss any questions you have with your health care provider.   Document Released: 05/02/2004 Document Revised: 06/19/2014 Document Reviewed: 07/23/2013 Elsevier Interactive Patient Education [employer].

## 2015-05-06 LAB — VITAMIN D 25 HYDROXY (VIT D DEFICIENCY, FRACTURES): Vit D, 25-Hydroxy: 12 ng/mL — ABNORMAL LOW (ref 30–100)

## 2015-05-07 ENCOUNTER — Telehealth: Payer: Self-pay | Admitting: Family Medicine

## 2015-05-07 ENCOUNTER — Encounter: Payer: Self-pay | Admitting: Family Medicine

## 2015-05-07 DIAGNOSIS — E119 Type 2 diabetes mellitus without complications: Secondary | ICD-10-CM

## 2015-05-07 DIAGNOSIS — E559 Vitamin D deficiency, unspecified: Secondary | ICD-10-CM

## 2015-05-07 MED ORDER — ERGOCALCIFEROL 1.25 MG (50000 UT) PO CAPS: 50000.0000 [IU] | ORAL_CAPSULE | ORAL | Status: DC

## 2015-05-07 NOTE — Telephone Encounter (Signed)
Meds ordered this encounter  Medications  . ergocalciferol (VITAMIN D2) 50000 UNITS capsule    Sig: Take 1 capsule (50,000 Units total) by mouth once a week.    Dispense:  4 capsule    Refill:  6

## 2015-05-10 NOTE — Telephone Encounter (Signed)
Called and spoke with patient advised of lab results and to start levemir BID as directed. Also advised patient of vitamin D deficiency  and to start vitamin D as directed. Patient verbalized understanding and had no other questions

## 2015-05-13 ENCOUNTER — Ambulatory Visit (INDEPENDENT_AMBULATORY_CARE_PROVIDER_SITE_OTHER): Payer: Medicaid Other | Admitting: Family Medicine

## 2015-05-13 ENCOUNTER — Encounter: Payer: Self-pay | Admitting: Family Medicine

## 2015-05-13 VITALS — BP 172/92 | HR 96 | Temp 98.9°F | Resp 16 | Ht 62.5 in | Wt 170.0 lb

## 2015-05-13 DIAGNOSIS — R0609 Other forms of dyspnea: Secondary | ICD-10-CM

## 2015-05-13 DIAGNOSIS — R0600 Dyspnea, unspecified: Secondary | ICD-10-CM

## 2015-05-13 DIAGNOSIS — R059 Cough, unspecified: Secondary | ICD-10-CM

## 2015-05-13 DIAGNOSIS — Z87898 Personal history of other specified conditions: Secondary | ICD-10-CM

## 2015-05-13 DIAGNOSIS — R05 Cough: Secondary | ICD-10-CM

## 2015-05-13 DIAGNOSIS — R0689 Other abnormalities of breathing: Secondary | ICD-10-CM | POA: Diagnosis not present

## 2015-05-13 DIAGNOSIS — I10 Essential (primary) hypertension: Secondary | ICD-10-CM

## 2015-05-13 DIAGNOSIS — R0989 Other specified symptoms and signs involving the circulatory and respiratory systems: Secondary | ICD-10-CM

## 2015-05-13 LAB — POCT INFLUENZA A/B
INFLUENZA A, POC: NEGATIVE
INFLUENZA B, POC: NEGATIVE

## 2015-05-13 MED ORDER — AZITHROMYCIN 250 MG PO TABS: ORAL_TABLET | ORAL | Status: DC

## 2015-05-13 MED ORDER — DEXTROMETHORPHAN-GUAIFENESIN 10-200 MG PO CAPS: 1.0000 | ORAL_CAPSULE | Freq: Four times a day (QID) | ORAL | Status: DC | PRN

## 2015-05-13 NOTE — Progress Notes (Signed)
Subjective:    Patient ID: Christina Orozco, female    DOB: May 02, 1971, 44 y.o.   MRN: [id number]  URI  This is a new problem. The current episode started in the past 7 days. The problem has been gradually worsening. The maximum temperature recorded prior to her arrival was 100.4 - 100.9 F. The fever has been present for less than 1 day. Associated symptoms include congestion, coughing, sneezing and a sore throat. Pertinent negatives include no abdominal pain, chest pain, diarrhea, dysuria, ear pain, headaches, joint pain, plugged ear sensation, rash, rhinorrhea, sinus pain, swollen glands, vomiting or wheezing. She has tried decongestant for the symptoms. The treatment provided no relief.   Past Medical History  Diagnosis Date  . Diabetes mellitus   . Hypertension   . Asthma   . GERD (gastroesophageal reflux disease)   . Neuropathy ([place])   . DDD (degenerative disc disease), cervical   . Diabetes mellitus ([place])   . Hypertension    Immunization History  Administered Date(s) Administered  . Influenza,inj,Quad PF,36+ Mos 05/05/2015  . Pneumococcal Polysaccharide-23 05/05/2015   Social History   Social History  . Marital Status: Single    Spouse Name: N/A  . Number of Children: N/A  . Years of Education: N/A   Occupational History  . Not on file.   Social History Main Topics  . Smoking status: Never Smoker   . Smokeless tobacco: Never Used  . Alcohol Use: No  . Drug Use: No  . Sexual Activity: Not on file   Other Topics Concern  . Not on file   Social History Narrative   Past Surgical History  Procedure Laterality Date  . Tonsillectomy    . Appendectomy    . Adenoidectomy    . Cervical spine surgery  06/2012    C5-C6  . Shoulder surgery      Review of Systems  HENT: Positive for congestion, sneezing and sore throat. Negative for ear pain, postnasal drip and rhinorrhea.   Respiratory: Positive for cough. Negative for wheezing.   Cardiovascular: Negative.   Negative for chest pain.  Gastrointestinal: Negative.  Negative for vomiting, abdominal pain and diarrhea.  Endocrine: Negative.   Genitourinary: Negative.  Negative for dysuria.  Musculoskeletal: Negative for joint pain.  Skin: Negative.  Negative for rash.  Allergic/Immunologic: Negative.   Neurological: Negative.  Negative for headaches.        Objective:   Physical Exam  Constitutional: She is oriented to person, place, and time. She appears well-developed and well-nourished. She has a sickly appearance.  HENT:  Head: Normocephalic and atraumatic.  Right Ear: External ear normal.  Left Ear: External ear normal.  Mouth/Throat: Oropharynx is clear and moist.  Eyes: Conjunctivae and EOM are normal. Pupils are equal, round, and reactive to light.  Neck: Normal range of motion.  Cardiovascular: Normal rate, regular rhythm, normal heart sounds and intact distal pulses.   Pulmonary/Chest: Effort normal and breath sounds normal. No accessory muscle usage. No apnea and no tachypnea. No respiratory distress.  Abdominal: Soft. Bowel sounds are normal.  Musculoskeletal: Normal range of motion.  Neurological: She is alert and oriented to person, place, and time. She has normal reflexes.  Skin: Skin is warm and dry.  Psychiatric: She has a normal mood and affect. Her behavior is normal. Judgment and thought content normal.       BP 172/92 mmHg  Pulse 96  Temp(Src) 98.9 F (37.2 C) (Oral)  Resp 16  Ht 5' 2.5" (1.588  m)  Wt 170 lb (77.111 kg)  BMI 30.58 kg/m2  SpO2 100%  LMP 04/26/2015 Assessment & Plan:  1. Symptoms of upper respiratory infection (URI) Reviewed influenza results, negative. Increase fluid intake, rest, handwashing, and vitamin C intake.  - Influenza A/B - azithromycin (ZITHROMAX) 250 MG tablet; Take 500 mg today; Days 2-5 take 250 mg daily  Dispense: 6 tablet; Refill: 0  2. Cough - Dextromethorphan-Guaifenesin (CORICIDIN HBP CONGESTION/COUGH) 10-200 MG CAPS; Take 1  capsule by mouth every 6 (six) hours as needed.  Dispense: 30 each; Refill: 0  3. History of fever Temperature maximum is 101. Recommend OTC Tylenol 500 mg every 6 hours as needed for fever and/or mild to moderate bodyaches.  - Influenza A/B  4. Essential hypertension Patient did not take anti-hypertensive medications this am. Advised patient to take medications as prescribed.   RTC: As previously scheduled The patient was given clear instructions to go to ER or return to medical center if symptoms do not improve, worsen or new problems develop. The patient verbalized understanding. Will notify patient with laboratory results. Dorena Dew, FNP

## 2015-05-13 NOTE — Patient Instructions (Signed)
Increase Fluid intake Increase Rest and Handwashing Increase Vitamin C intake    Upper Respiratory Infection, Adult Most upper respiratory infections (URIs) are a viral infection of the air passages leading to the lungs. A URI affects the nose, throat, and upper air passages. The most common type of URI is nasopharyngitis and is typically referred to as "the common cold." URIs run their course and usually go away on their own. Most of the time, a URI does not require medical attention, but sometimes a bacterial infection in the upper airways can follow a viral infection. This is called a secondary infection. Sinus and middle ear infections are common types of secondary upper respiratory infections. Bacterial pneumonia can also complicate a URI. A URI can worsen asthma and chronic obstructive pulmonary disease (COPD). Sometimes, these complications can require emergency medical care and may be life threatening.  CAUSES Almost all URIs are caused by viruses. A virus is a type of germ and can spread from one person to another.  RISKS FACTORS You may be at risk for a URI if:   You smoke.   You have chronic heart or lung disease.  You have a weakened defense (immune) system.   You are very young or very old.   You have nasal allergies or asthma.  You work in crowded or poorly ventilated areas.  You work in health care facilities or schools. SIGNS AND SYMPTOMS  Symptoms typically develop 2-3 days after you come in contact with a cold virus. Most viral URIs last 7-10 days. However, viral URIs from the influenza virus (flu virus) can last 14-18 days and are typically more severe. Symptoms may include:   Runny or stuffy (congested) nose.   Sneezing.   Cough.   Sore throat.   Headache.   Fatigue.   Fever.   Loss of appetite.   Pain in your forehead, behind your eyes, and over your cheekbones (sinus pain).  Muscle aches.  DIAGNOSIS  Your health care provider may  diagnose a URI by:  Physical exam.  Tests to check that your symptoms are not due to another condition such as:  Strep throat.  Sinusitis.  Pneumonia.  Asthma. TREATMENT  A URI goes away on its own with time. It cannot be cured with medicines, but medicines may be prescribed or recommended to relieve symptoms. Medicines may help:  Reduce your fever.  Reduce your cough.  Relieve nasal congestion. HOME CARE INSTRUCTIONS   Take medicines only as directed by your health care provider.   Gargle warm saltwater or take cough drops to comfort your throat as directed by your health care provider.  Use a warm mist humidifier or inhale steam from a shower to increase air moisture. This may make it easier to breathe.  Drink enough fluid to keep your urine clear or pale yellow.   Eat soups and other clear broths and maintain good nutrition.   Rest as needed.   Return to work when your temperature has returned to normal or as your health care provider advises. You may need to stay home longer to avoid infecting others. You can also use a face mask and careful hand washing to prevent spread of the virus.  Increase the usage of your inhaler if you have asthma.   Do not use any tobacco products, including cigarettes, chewing tobacco, or electronic cigarettes. If you need help quitting, ask your health care provider. PREVENTION  The best way to protect yourself from getting a cold is to  practice good hygiene.   Avoid oral or hand contact with people with cold symptoms.   Wash your hands often if contact occurs.  There is no clear evidence that vitamin C, vitamin E, echinacea, or exercise reduces the chance of developing a cold. However, it is always recommended to get plenty of rest, exercise, and practice good nutrition.  SEEK MEDICAL CARE IF:   You are getting worse rather than better.   Your symptoms are not controlled by medicine.   You have chills.  You have  worsening shortness of breath.  You have brown or red mucus.  You have yellow or brown nasal discharge.  You have pain in your face, especially when you bend forward.  You have a fever.  You have swollen neck glands.  You have pain while swallowing.  You have white areas in the back of your throat. SEEK IMMEDIATE MEDICAL CARE IF:   You have severe or persistent:  Headache.  Ear pain.  Sinus pain.  Chest pain.  You have chronic lung disease and any of the following:  Wheezing.  Prolonged cough.  Coughing up blood.  A change in your usual mucus.  You have a stiff neck.  You have changes in your:  Vision.  Hearing.  Thinking.  Mood. MAKE SURE YOU:   Understand these instructions.  Will watch your condition.  Will get help right away if you are not doing well or get worse.   This information is not intended to replace advice given to you by your health care provider. Make sure you discuss any questions you have with your health care provider.   Document Released: 11/22/2000 Document Revised: 10/13/2014 Document Reviewed: 09/03/2013 Elsevier Interactive Patient Education [employer].

## 2015-06-08 ENCOUNTER — Other Ambulatory Visit: Payer: Self-pay | Admitting: Family Medicine

## 2015-06-08 ENCOUNTER — Encounter: Payer: Self-pay | Admitting: Family Medicine

## 2015-06-08 ENCOUNTER — Ambulatory Visit (INDEPENDENT_AMBULATORY_CARE_PROVIDER_SITE_OTHER): Payer: Medicaid Other | Admitting: Family Medicine

## 2015-06-08 VITALS — BP 162/92 | HR 80 | Temp 98.1°F | Resp 16 | Ht 62.5 in | Wt 174.0 lb

## 2015-06-08 DIAGNOSIS — I10 Essential (primary) hypertension: Secondary | ICD-10-CM

## 2015-06-08 DIAGNOSIS — E559 Vitamin D deficiency, unspecified: Secondary | ICD-10-CM | POA: Diagnosis not present

## 2015-06-08 DIAGNOSIS — B373 Candidiasis of vulva and vagina: Secondary | ICD-10-CM

## 2015-06-08 DIAGNOSIS — E119 Type 2 diabetes mellitus without complications: Secondary | ICD-10-CM | POA: Diagnosis not present

## 2015-06-08 DIAGNOSIS — L659 Nonscarring hair loss, unspecified: Secondary | ICD-10-CM

## 2015-06-08 DIAGNOSIS — G629 Polyneuropathy, unspecified: Secondary | ICD-10-CM

## 2015-06-08 DIAGNOSIS — K219 Gastro-esophageal reflux disease without esophagitis: Secondary | ICD-10-CM

## 2015-06-08 DIAGNOSIS — B3731 Acute candidiasis of vulva and vagina: Secondary | ICD-10-CM

## 2015-06-08 LAB — POCT URINALYSIS DIP (DEVICE)
Bilirubin Urine: NEGATIVE
GLUCOSE, UA: 500 mg/dL — AB
Ketones, ur: NEGATIVE mg/dL
Leukocytes, UA: NEGATIVE
Nitrite: NEGATIVE
PROTEIN: 30 mg/dL — AB
SPECIFIC GRAVITY, URINE: 1.02 (ref 1.005–1.030)
UROBILINOGEN UA: 0.2 mg/dL (ref 0.0–1.0)
pH: 6 (ref 5.0–8.0)

## 2015-06-08 LAB — GLUCOSE, CAPILLARY: Glucose-Capillary: 267 mg/dL — ABNORMAL HIGH (ref 65–99)

## 2015-06-08 MED ORDER — AMLODIPINE BESYLATE 10 MG PO TABS: 10.0000 mg | ORAL_TABLET | Freq: Every day | ORAL | Status: DC

## 2015-06-08 MED ORDER — OMEPRAZOLE 40 MG PO CPDR: 40.0000 mg | DELAYED_RELEASE_CAPSULE | Freq: Every day | ORAL | Status: DC

## 2015-06-08 MED ORDER — FLUCONAZOLE 150 MG PO TABS: 150.0000 mg | ORAL_TABLET | Freq: Once | ORAL | Status: DC

## 2015-06-08 MED ORDER — LOSARTAN POTASSIUM 100 MG PO TABS: 100.0000 mg | ORAL_TABLET | Freq: Every day | ORAL | Status: DC

## 2015-06-08 NOTE — Patient Instructions (Addendum)
[place] or Cold PressedDiabetes and Exercise Exercising regularly is important. It is not just about losing weight. It has many health benefits, such as:  Improving your overall fitness, flexibility, and endurance.  Increasing your bone density.  Helping with weight control.  Decreasing your body fat.  Increasing your muscle strength.  Reducing stress and tension.  Improving your overall health. People with diabetes who exercise gain additional benefits because exercise:  Reduces appetite.  Improves the body's use of blood sugar (glucose).  Helps lower or control blood glucose.  Decreases blood pressure.  Helps control blood lipids (such as cholesterol and triglycerides).  Improves the body's use of the hormone insulin by:  Increasing the body's insulin sensitivity.  Reducing the body's insulin needs.  Decreases the risk for heart disease because exercising:  Lowers cholesterol and triglycerides levels.  Increases the levels of good cholesterol (such as high-density lipoproteins [HDL]) in the body.  Lowers blood glucose levels. YOUR ACTIVITY PLAN  Choose an activity that you enjoy, and set realistic goals. To exercise safely, you should begin practicing any new physical activity slowly, and gradually increase the intensity of the exercise over time. Your health care provider or diabetes educator can help create an activity plan that works for you. General recommendations include:  Encouraging children to engage in at least 60 minutes of physical activity each day.  Stretching and performing strength training exercises, such as yoga or weight lifting, at least 2 times per week.  Performing a total of at least 150 minutes of moderate-intensity exercise each week, such as brisk walking or water aerobics.  Exercising at least 3 days per week, making sure you allow no more than 2 consecutive days to pass without exercising.  Avoiding long periods of  inactivity (90 minutes or more). When you have to spend an extended period of time sitting down, take frequent breaks to walk or stretch. RECOMMENDATIONS FOR EXERCISING WITH TYPE 1 OR TYPE 2 DIABETES   Check your blood glucose before exercising. If blood glucose levels are greater than 240 mg/dL, check for urine ketones. Do not exercise if ketones are present.  Avoid injecting insulin into areas of the body that are going to be exercised. For example, avoid injecting insulin into:  The arms when playing tennis.  The legs when jogging.  Keep a record of:  Food intake before and after you exercise.  Expected peak times of insulin action.  Blood glucose levels before and after you exercise.  The type and amount of exercise you have done.  Review your records with your health care provider. Your health care provider will help you to develop guidelines for adjusting food intake and insulin amounts before and after exercising.  If you take insulin or oral hypoglycemic agents, watch for signs and symptoms of hypoglycemia. They include:  Dizziness.  Shaking.  Sweating.  Chills.  Confusion.  Drink plenty of water while you exercise to prevent dehydration or heat stroke. Body water is lost during exercise and must be replaced.  Talk to your health care provider before starting an exercise program to make sure it is safe for you. Remember, almost any type of activity is better than none.   This information is not intended to replace advice given to you by your health care provider. Make sure you discuss any questions you have with your health care provider.   Document Released: 08/19/2003 Document Revised: 10/13/2014 Document Reviewed: 11/05/2012 Elsevier Interactive Patient Education 2016 [employer]. Daily Diabetes  Record Check your blood glucose (BG) as directed by your health care provider. Use this form to record your results as well as any diabetes medicines you take,  including insulin. Checking your BG, recording it, and bringing your records to your health care provider is very helpful in managing your diabetes. These numbers help your health care provider know if any changes are needed to your diabetes plan.  Week of _____________________________ Date: _________  Christina Orozco, BG/Medicines: ________________ / __________________________________________________________  LUNCH, BG/Medicines: ____________________ / __________________________________________________________  Christina Orozco, BG/Medicines: ___________________ / __________________________________________________________  BEDTIME, BG/Medicines: __________________ / __________________________________________________________ Date: _________  Christina Orozco, BG/Medicines: ________________ / __________________________________________________________  LUNCH, BG/Medicines: ____________________ / __________________________________________________________  Christina Orozco, BG/Medicines: ___________________ / __________________________________________________________  BEDTIME, BG/Medicines: __________________ / __________________________________________________________ Date: _________  Christina Orozco, BG/Medicines: ________________ / __________________________________________________________  LUNCH, BG/Medicines: ____________________ / __________________________________________________________  Christina Orozco, BG/Medicines: ___________________ / __________________________________________________________  BEDTIME, BG/Medicines: __________________ / __________________________________________________________ Date: _________  Christina Orozco, BG/Medicines: ________________ / __________________________________________________________  LUNCH, BG/Medicines: ____________________ / __________________________________________________________  Christina Orozco, BG/Medicines: ___________________ /  __________________________________________________________  BEDTIME, BG/Medicines: __________________ / __________________________________________________________ Date: _________  Christina Orozco, BG/Medicines: ________________ / __________________________________________________________  LUNCH, BG/Medicines: ____________________ / __________________________________________________________  Christina Orozco, BG/Medicines: ___________________ / __________________________________________________________  BEDTIME, BG/Medicines: __________________ / __________________________________________________________ Date: _________  Christina Orozco, BG/Medicines: ________________ / __________________________________________________________  LUNCH, BG/Medicines: ____________________ / __________________________________________________________  Christina Orozco, BG/Medicines: ___________________ / __________________________________________________________  BEDTIME, BG/Medicines: __________________ / __________________________________________________________ Date: _________  Christina Orozco, BG/Medicines: ________________ / __________________________________________________________  LUNCH, BG/Medicines: ____________________ / __________________________________________________________  Christina Orozco, BG/Medicines: ___________________ / __________________________________________________________  BEDTIME, BG/Medicines: __________________ / __________________________________________________________ Notes: __________________________________________________________________________________________________   This information is not intended to replace advice given to you by your health care provider. Make sure you discuss any questions you have with your health care provider.   Document Released: 05/02/2004 Document Revised: 06/19/2014 Document Reviewed: 07/23/2013 Elsevier Interactive Patient Education 2016 [place] DASH  stands for "Dietary Approaches to Stop Hypertension." The DASH eating plan is a healthy eating plan that has been shown to reduce high blood pressure (hypertension). Additional health benefits may include reducing the risk of type 2 diabetes mellitus, heart disease, and stroke. The DASH eating plan may also help with weight loss. WHAT DO I NEED TO KNOW ABOUT THE DASH EATING PLAN? For the DASH eating plan, you will follow these general guidelines:  Choose foods with a percent daily value for sodium of less than 5% (as listed on the food label).  Use salt-free seasonings or herbs instead of table salt or sea salt.  Check with your health care provider or pharmacist before using salt substitutes.  Eat lower-sodium products, often labeled as "lower sodium" or "no salt added."  Eat fresh foods.  Eat more vegetables, fruits, and low-fat dairy products.  Choose whole grains. Look for the word "whole" as the first word in the ingredient list.  Choose fish and skinless chicken or [place] more often than red meat. Limit fish, poultry, and meat to 6 oz (170 g) each day.  Limit sweets, desserts, sugars, and sugary drinks.  Choose heart-healthy fats.  Limit cheese to 1 oz (28 g) per day.  Eat more home-cooked food and less restaurant, buffet, and fast food.  Limit fried foods.  Cook foods using methods other than frying.  Limit canned vegetables. If you do use them, rinse them well to decrease the sodium.  When eating at a restaurant, ask that your food be prepared with less salt, or no salt if possible. WHAT FOODS CAN I EAT? Seek help from a dietitian for individual calorie needs. Grains Whole grain  or whole wheat bread. Brown rice. Whole grain or whole wheat pasta. Quinoa, bulgur, and whole grain cereals. Low-sodium cereals. Corn or whole wheat flour tortillas. Whole grain cornbread. Whole grain crackers. Low-sodium crackers. Vegetables Fresh or frozen vegetables (raw, steamed, roasted,  or grilled). Low-sodium or reduced-sodium tomato and vegetable juices. Low-sodium or reduced-sodium tomato sauce and paste. Low-sodium or reduced-sodium canned vegetables.  Fruits All fresh, canned (in natural juice), or frozen fruits. Meat and Other Protein Products Ground beef (85% or leaner), grass-fed beef, or beef trimmed of fat. Skinless chicken or [place]. Ground chicken or [place]. Pork trimmed of fat. All fish and seafood. Eggs. Dried beans, peas, or lentils. Unsalted nuts and seeds. Unsalted canned beans. Dairy Low-fat dairy products, such as skim or 1% milk, 2% or reduced-fat cheeses, low-fat ricotta or cottage cheese, or plain low-fat yogurt. Low-sodium or reduced-sodium cheeses. Fats and Oils Tub margarines without trans fats. Light or reduced-fat mayonnaise and salad dressings (reduced sodium). Avocado. Safflower, olive, or canola oils. Natural peanut or almond butter. Other Unsalted popcorn and pretzels. The items listed above may not be a complete list of recommended foods or beverages. Contact your dietitian for more options. WHAT FOODS ARE NOT RECOMMENDED? Grains White bread. White pasta. White rice. Refined cornbread. Bagels and croissants. Crackers that contain trans fat. Vegetables Creamed or fried vegetables. Vegetables in a cheese sauce. Regular canned vegetables. Regular canned tomato sauce and paste. Regular tomato and vegetable juices. Fruits Dried fruits. Canned fruit in light or heavy syrup. Fruit juice. Meat and Other Protein Products Fatty cuts of meat. Ribs, chicken wings, bacon, sausage, bologna, salami, chitterlings, fatback, hot dogs, bratwurst, and packaged luncheon meats. Salted nuts and seeds. Canned beans with salt. Dairy Whole or 2% milk, cream, half-and-half, and cream cheese. Whole-fat or sweetened yogurt. Full-fat cheeses or blue cheese. Nondairy creamers and whipped toppings. Processed cheese, cheese spreads, or cheese curds. Condiments Onion and  garlic salt, seasoned salt, table salt, and sea salt. Canned and packaged gravies. Worcestershire sauce. Tartar sauce. Barbecue sauce. Teriyaki sauce. Soy sauce, including reduced sodium. Steak sauce. Fish sauce. Oyster sauce. Cocktail sauce. Horseradish. Ketchup and mustard. Meat flavorings and tenderizers. Bouillon cubes. Hot sauce. Tabasco sauce. Marinades. Taco seasonings. Relishes. Fats and Oils Butter, stick margarine, lard, shortening, ghee, and bacon fat. Coconut, palm kernel, or palm oils. Regular salad dressings. Other Pickles and olives. Salted popcorn and pretzels. The items listed above may not be a complete list of foods and beverages to avoid. Contact your dietitian for more information. WHERE CAN I FIND MORE INFORMATION? National Heart, Lung, and Blood Institute: travelstabloid.com   This information is not intended to replace advice given to you by your health care provider. Make sure you discuss any questions you have with your health care provider.   Document Released: 05/18/2011 Document Revised: 06/19/2014 Document Reviewed: 04/02/2013 Elsevier Interactive Patient Education 2016 Elsevier Inc. Monilial Vaginitis Vaginitis in a soreness, swelling and redness (inflammation) of the vagina and vulva. Monilial vaginitis is not a sexually transmitted infection. CAUSES  Yeast vaginitis is caused by yeast (candida) that is normally found in your vagina. With a yeast infection, the candida has overgrown in number to a point that upsets the chemical balance. SYMPTOMS   White, thick vaginal discharge.  Swelling, itching, redness and irritation of the vagina and possibly the lips of the vagina (vulva).  Burning or painful urination.  Painful intercourse. DIAGNOSIS  Things that may contribute to monilial vaginitis are:  Postmenopausal and virginal states.  Pregnancy.  Infections.  Being tired, sick or stressed, especially if you had monilial  vaginitis in the past.  Diabetes. Good control will help lower the chance.  Birth control pills.  Tight fitting garments.  Using bubble bath, feminine sprays, douches or deodorant tampons.  Taking certain medications that kill germs (antibiotics).  Sporadic recurrence can occur if you become ill. TREATMENT  Your caregiver will give you medication.  There are several kinds of anti monilial vaginal creams and suppositories specific for monilial vaginitis. For recurrent yeast infections, use a suppository or cream in the vagina 2 times a week, or as directed.  Anti-monilial or steroid cream for the itching or irritation of the vulva may also be used. Get your caregiver's permission.  Painting the vagina with methylene blue solution may help if the monilial cream does not work.  Eating yogurt may help prevent monilial vaginitis. HOME CARE INSTRUCTIONS   Finish all medication as prescribed.  Do not have sex until treatment is completed or after your caregiver tells you it is okay.  Take warm sitz baths.  Do not douche.  Do not use tampons, especially scented ones.  Wear cotton underwear.  Avoid tight pants and panty hose.  Tell your sexual partner that you have a yeast infection. They should go to their caregiver if they have symptoms such as mild rash or itching.  Your sexual partner should be treated as well if your infection is difficult to eliminate.  Practice safer sex. Use condoms.  Some vaginal medications cause latex condoms to fail. Vaginal medications that harm condoms are:  Cleocin cream.  Butoconazole (Femstat).  Terconazole (Terazol) vaginal suppository.  Miconazole (Monistat) (may be purchased over the counter). SEEK MEDICAL CARE IF:   You have a temperature by mouth above 102 F (38.9 C).  The infection is getting worse after 2 days of treatment.  The infection is not getting better after 3 days of treatment.  You develop blisters in or  around your vagina.  You develop vaginal bleeding, and it is not your menstrual period.  You have pain when you urinate.  You develop intestinal problems.  You have pain with sexual intercourse.   This information is not intended to replace advice given to you by your health care provider. Make sure you discuss any questions you have with your health care provider.   Document Released: 03/08/2005 Document Revised: 08/21/2011 Document Reviewed: 11/30/2014 Elsevier Interactive Patient Education [employer].

## 2015-06-08 NOTE — Progress Notes (Signed)
Subjective:    Patient ID: Christina Orozco, female    DOB: Jun 21, 1970, 44 y.o.   MRN: [id number]  Hypertension Pertinent negatives include no chest pain or palpitations.    [name], a 44 year old female that presents for a 1 month follow up of diabetes and hypertension.  Patient has a history of type 2 diabetes mellitus. Patient reports lower extremity neuropathy, but denies foot ulcerations, increased appetite, nausea, polydipsia, visual disturbances, vomiting, or weight loss. She reports periodic pain to left foot.  She maintains that blood pressure has been consistently greater than 200 fasting. She has been consistently taking Levemir 15 units BID.  Patient is also following up for hypertension.  She is not exercising or following a low sodium diet. She does not check blood pressure at home.   Patient denies dizziness, chest pain, dyspnea, irregular heart beat, lower extremity edema, palpitations and tachypnea.    [name] is complaining of vaginal itching for greater than than 1 month. She states that she is not sexually active. She states that discharge is white and thick. She denies fever, fatigue, dysuria, or abdominal pain.  She reports that she has not attempted any OTC interventions to alleviate current symptoms.   Past Medical History  Diagnosis Date  . Diabetes mellitus   . Hypertension   . Asthma   . GERD (gastroesophageal reflux disease)   . Neuropathy ([place])   . DDD (degenerative disc disease), cervical   . Diabetes mellitus ([place])   . Hypertension    Social History   Social History  . Marital Status: Single    Spouse Name: N/A  . Number of Children: N/A  . Years of Education: N/A   Occupational History  . Not on file.   Social History Main Topics  . Smoking status: Never Smoker   . Smokeless tobacco: Never Used  . Alcohol Use: No  . Drug Use: No  . Sexual Activity: Not on file   Other Topics Concern  . Not on file   Social History  Narrative   Immunization History  Administered Date(s) Administered  . Influenza,inj,Quad PF,36+ Mos 05/05/2015  . Pneumococcal Polysaccharide-23 05/05/2015   Past Surgical History  Procedure Laterality Date  . Tonsillectomy    . Appendectomy    . Adenoidectomy    . Cervical spine surgery  06/2012    C5-C6  . Shoulder surgery     Review of Systems  Constitutional: Positive for fatigue.  HENT: Negative.   Eyes: Negative.   Respiratory: Negative.   Cardiovascular: Negative.  Negative for chest pain, palpitations and leg swelling.  Gastrointestinal: Negative.   Endocrine: Positive for polydipsia and polyphagia. Negative for polyuria.  Genitourinary: Negative.  Negative for urgency, frequency and flank pain.  Musculoskeletal: Negative.  Negative for myalgias.       Left foot pain/neuropathy  Skin: Negative.   Allergic/Immunologic: Negative.  Negative for immunocompromised state.  Neurological: Positive for numbness. Negative for dizziness, weakness and light-headedness.       Neuropathy  Hematological: Negative.   Psychiatric/Behavioral: Negative for suicidal ideas and sleep disturbance. The patient is nervous/anxious.        Objective:   Physical Exam  Constitutional: She is oriented to person, place, and time. She appears well-developed and well-nourished.  HENT:  Head: Normocephalic and atraumatic.  Right Ear: External ear normal.  Left Ear: External ear normal.  Nose: Nose normal.  Mouth/Throat: Oropharynx is clear and moist.  Eyes: Conjunctivae and EOM are normal.  Pupils are equal, round, and reactive to light.  Neck: Normal range of motion. Neck supple.  Cardiovascular: Normal rate, regular rhythm, normal heart sounds and intact distal pulses.   Pulmonary/Chest: Effort normal and breath sounds normal.  Abdominal: Soft. Bowel sounds are normal.  Genitourinary: Vaginal discharge found.  Musculoskeletal: Normal range of motion.  Neurological: She is alert and  oriented to person, place, and time. She has normal reflexes. She displays no atrophy. No cranial nerve deficit or sensory deficit. She exhibits normal muscle tone. Gait normal.  Monofilament test negative  Skin: Skin is warm.  Callous to left plantar aspect of foot.  Alopecia to scalp  Psychiatric: She has a normal mood and affect. Her behavior is normal. Judgment and thought content normal.      BP 162/92 mmHg  Pulse 80  Temp(Src) 98.1 F (36.7 C) (Oral)  Resp 16  Ht 5' 2.5" (1.588 m)  Wt 174 lb (78.926 kg)  BMI 31.30 kg/m2  LMP 05/23/2015 Assessment & Plan:   1. Essential hypertension Blood pressure is not at goal on current medication regimen. Will increase Amlodipine to 10 mg daily. Patient is to follow up in office in 1 month. Recommend DASH diet, given written information.  - amLODipine (NORVASC) 10 MG tablet; Take 1 tablet (10 mg total) by mouth daily.  Dispense: 30 tablet; Refill: 5 - losartan (COZAAR) 100 MG tablet; Take 1 tablet (100 mg total) by mouth daily.  Dispense: 30 tablet; Refill: 5  2. Type 2 diabetes mellitus without complication, without long-term current use of insulin (HCC) Blood sugars are consistently greater than 200 fasting in am. Also, CBGs are elevated 2 hours post prandial. Will start  - Glucose (CBG) - POCT urinalysis dipstick  3. Vitamin D deficiency Continue vitamin D weekly as previously prescribed.   4. Vaginal yeast infection 5-7 yeasts per high power field.  - fluconazole (DIFLUCAN) 150 MG tablet; Take 1 tablet (150 mg total) by mouth once.  Dispense: 3 tablet; Refill: 0 - POCT wet + KOH prep  5. Gastroesophageal reflux disease without esophagitis - omeprazole (PRILOSEC) 40 MG capsule; Take 1 capsule (40 mg total) by mouth daily. Reported on 06/08/2015  Dispense: 30 capsule; Refill: 5  6. Neuropathy Patient states that she was previously on Cymbalta for neuropathy. She does not remember current dosage. Will review previous medical  records.   RTC: 1 month for hypertension and DMII  Kamariah Fruchter M, FNP  The patient was given clear instructions to go to ER or return to medical center if symptoms do not improve, worsen or new problems develop. The patient verbalized understanding. Will notify patient with laboratory results.

## 2015-07-04 ENCOUNTER — Encounter (HOSPITAL_COMMUNITY): Payer: Self-pay | Admitting: *Deleted

## 2015-07-04 ENCOUNTER — Encounter (HOSPITAL_COMMUNITY): Payer: Self-pay | Admitting: Emergency Medicine

## 2015-07-04 ENCOUNTER — Emergency Department (HOSPITAL_COMMUNITY)
Admission: EM | Admit: 2015-07-04 | Discharge: 2015-07-04 | Discharge status: Against Medical Advice | Payer: Medicaid Other | Attending: Emergency Medicine | Admitting: Emergency Medicine

## 2015-07-04 ENCOUNTER — Emergency Department (HOSPITAL_COMMUNITY): Payer: Medicaid Other

## 2015-07-04 ENCOUNTER — Emergency Department (HOSPITAL_COMMUNITY)
Admission: EM | Admit: 2015-07-04 | Discharge: 2015-07-05 | Disposition: A | Discharge status: Home / Self Care | Payer: Medicaid Other | Attending: Physician Assistant | Admitting: Physician Assistant

## 2015-07-04 DIAGNOSIS — Z880 Allergy status to penicillin: Secondary | ICD-10-CM | POA: Insufficient documentation

## 2015-07-04 DIAGNOSIS — J45909 Unspecified asthma, uncomplicated: Secondary | ICD-10-CM | POA: Diagnosis not present

## 2015-07-04 DIAGNOSIS — R202 Paresthesia of skin: Secondary | ICD-10-CM | POA: Diagnosis not present

## 2015-07-04 DIAGNOSIS — Z8719 Personal history of other diseases of the digestive system: Secondary | ICD-10-CM | POA: Insufficient documentation

## 2015-07-04 DIAGNOSIS — Z8669 Personal history of other diseases of the nervous system and sense organs: Secondary | ICD-10-CM | POA: Diagnosis not present

## 2015-07-04 DIAGNOSIS — E119 Type 2 diabetes mellitus without complications: Secondary | ICD-10-CM | POA: Insufficient documentation

## 2015-07-04 DIAGNOSIS — I10 Essential (primary) hypertension: Secondary | ICD-10-CM | POA: Insufficient documentation

## 2015-07-04 DIAGNOSIS — R200 Anesthesia of skin: Secondary | ICD-10-CM | POA: Insufficient documentation

## 2015-07-04 DIAGNOSIS — Z8739 Personal history of other diseases of the musculoskeletal system and connective tissue: Secondary | ICD-10-CM | POA: Diagnosis not present

## 2015-07-04 DIAGNOSIS — Z79899 Other long term (current) drug therapy: Secondary | ICD-10-CM | POA: Diagnosis not present

## 2015-07-04 DIAGNOSIS — Z794 Long term (current) use of insulin: Secondary | ICD-10-CM | POA: Diagnosis not present

## 2015-07-04 LAB — URINALYSIS, ROUTINE W REFLEX MICROSCOPIC
BILIRUBIN URINE: NEGATIVE
Glucose, UA: >1000 mg/dL — AB
KETONES UR: NEGATIVE mg/dL
Leukocytes, UA: NEGATIVE
Nitrite: NEGATIVE
PROTEIN: NEGATIVE mg/dL
Specific Gravity, Urine: 1.029 (ref 1.005–1.030)
pH: 6.5 (ref 5.0–8.0)

## 2015-07-04 LAB — DIFFERENTIAL
BASOS PCT: 0 %
Basophils Absolute: 0 10*3/uL (ref 0.0–0.1)
EOS PCT: 1 %
Eosinophils Absolute: 0.1 10*3/uL (ref 0.0–0.7)
Lymphocytes Relative: 39 %
Lymphs Abs: 2.6 10*3/uL (ref 0.7–4.0)
MONO ABS: 0.2 10*3/uL (ref 0.1–1.0)
MONOS PCT: 4 %
NEUTROS ABS: 3.7 10*3/uL (ref 1.7–7.7)
Neutrophils Relative %: 56 %

## 2015-07-04 LAB — COMPREHENSIVE METABOLIC PANEL
ALK PHOS: 47 U/L (ref 38–126)
ALT: 11 U/L — AB (ref 14–54)
ANION GAP: 8 (ref 5–15)
AST: 14 U/L — ABNORMAL LOW (ref 15–41)
Albumin: 3.7 g/dL (ref 3.5–5.0)
BUN: 13 mg/dL (ref 6–20)
CALCIUM: 9.4 mg/dL (ref 8.9–10.3)
CHLORIDE: 107 mmol/L (ref 101–111)
CO2: 24 mmol/L (ref 22–32)
CREATININE: 0.85 mg/dL (ref 0.44–1.00)
Glucose, Bld: 251 mg/dL — ABNORMAL HIGH (ref 65–99)
Potassium: 4.3 mmol/L (ref 3.5–5.1)
Sodium: 139 mmol/L (ref 135–145)
Total Bilirubin: 0.4 mg/dL (ref 0.3–1.2)
Total Protein: 7.3 g/dL (ref 6.5–8.1)

## 2015-07-04 LAB — CBC
HEMATOCRIT: 35.2 % — AB (ref 36.0–46.0)
Hemoglobin: 12.1 g/dL (ref 12.0–15.0)
MCH: 29.1 pg (ref 26.0–34.0)
MCHC: 34.4 g/dL (ref 30.0–36.0)
MCV: 84.6 fL (ref 78.0–100.0)
PLATELETS: 269 10*3/uL (ref 150–400)
RBC: 4.16 MIL/uL (ref 3.87–5.11)
RDW: 13.4 % (ref 11.5–15.5)
WBC: 6.6 10*3/uL (ref 4.0–10.5)

## 2015-07-04 LAB — I-STAT CHEM 8, ED
BUN: 15 mg/dL (ref 6–20)
CALCIUM ION: 1.24 mmol/L — AB (ref 1.12–1.23)
Chloride: 105 mmol/L (ref 101–111)
Creatinine, Ser: 0.8 mg/dL (ref 0.44–1.00)
GLUCOSE: 247 mg/dL — AB (ref 65–99)
HCT: 39 % (ref 36.0–46.0)
HEMOGLOBIN: 13.3 g/dL (ref 12.0–15.0)
Potassium: 4.1 mmol/L (ref 3.5–5.1)
SODIUM: 140 mmol/L (ref 135–145)
TCO2: 23 mmol/L (ref 0–100)

## 2015-07-04 LAB — URINE MICROSCOPIC-ADD ON

## 2015-07-04 LAB — I-STAT TROPONIN, ED: TROPONIN I, POC: 0.01 ng/mL (ref 0.00–0.08)

## 2015-07-04 LAB — CBG MONITORING, ED
GLUCOSE-CAPILLARY: 310 mg/dL — AB (ref 65–99)
GLUCOSE-CAPILLARY: 489 mg/dL — AB (ref 65–99)

## 2015-07-04 LAB — APTT: aPTT: 27 seconds (ref 24–37)

## 2015-07-04 LAB — PROTIME-INR
INR: 1.05 (ref 0.00–1.49)
PROTHROMBIN TIME: 13.9 s (ref 11.6–15.2)

## 2015-07-04 MED ORDER — SODIUM CHLORIDE 0.9 % IV BOLUS (SEPSIS)
1000.0000 mL | Freq: Once | INTRAVENOUS | Status: AC
  Administered 2015-07-04: 1000 mL via INTRAVENOUS

## 2015-07-04 NOTE — ED Notes (Signed)
Pt reports she woke up today at 0600 with R arm and hand numbness and tingling .  Pt reports she was fine when she went to bed at 2100 last night.  Pt also reports feeling "off balance" today.  No facial droop or slurred speech noted at this time.  Pt is ambulatory to triage room with steady gait.

## 2015-07-04 NOTE — ED Notes (Signed)
Pt reports she was at [facility] ED and waited for 4 hours so she left and came here.  Reports having blood work done and EKG done.

## 2015-07-04 NOTE — ED Notes (Signed)
Pt did not answer when called for

## 2015-07-04 NOTE — ED Provider Notes (Signed)
Medical screening examination/treatment/procedure(s) were conducted as a shared visit with non-physician practitioner(s) and myself.  I personally evaluated the patient during the encounter.  I saw patient with APP and agree with the assessment.   Patient is presenting with R arm numbness starting this am. Central vs peripheral, difficult to ascertain. Pt has history of shoulder neck issues, but this is inconsistent wth distribution.   Will get MR given risk factors, she appears in NAD on exam.      Anniemae Haberkorn Julio Alm, MD 07/04/15 2240

## 2015-07-04 NOTE — ED Notes (Signed)
Pt reports that she woke up at 0600 today with numbness and tingling to right arm. Pt reports that she has problems with her right shoulder and noticed yesterday that it was "Contracted" Pt works pulling boxes.

## 2015-07-04 NOTE — ED Notes (Signed)
Pt did not answer when called

## 2015-07-04 NOTE — ED Notes (Signed)
Called for pt x 3 no answer 

## 2015-07-04 NOTE — ED Provider Notes (Signed)
CSN: [id number]     Arrival date & time 07/04/15  1855 History   First MD Initiated Contact with Patient 07/04/15 2111     Chief Complaint  Patient presents with  . Numbness     (Consider location/radiation/quality/duration/timing/severity/associated sxs/prior Treatment) The history is provided by the patient and medical records.   45 y.o. F with hx of HTN, DM, asthma, GERD, neuropathy, DDD, presenting to the ED for right hand numbness and paresthesias.  Patient states she went to bed at 2100 last night and felt fine.  She states she woke up around 0600 and had some numbness and paresthesias of her right hand. She states this is persisted throughout the day today.  She states now the numbness and paresthesias seem to be extending into her forearm. Patient denies any weakness of her right arm. She states today she fell a little "off balance". No falls.  No numbness or weakness of legs.  No headache, slurred speech, She has no history of TIA or stroke. No head injuries or falls. No anti-coagulant use.  Patient does admit to prior issues with her neck and shoulder as had surgeries on both of these areas, but she states this numbness and paresthesias are new for her. Patient was at [facility] ED earlier today but was in the waiting room for 4 hours so she left and decided to come here. She did have lab work performed, no imaging.  VSS.  Past Medical History  Diagnosis Date  . Diabetes mellitus   . Hypertension   . Asthma   . GERD (gastroesophageal reflux disease)   . Neuropathy ([place])   . DDD (degenerative disc disease), cervical   . Diabetes mellitus ([place])   . Hypertension    Past Surgical History  Procedure Laterality Date  . Tonsillectomy    . Appendectomy    . Adenoidectomy    . Cervical spine surgery  06/2012    C5-C6  . Shoulder surgery     No family history on file. Social History  Substance Use Topics  . Smoking status: Never Smoker   . Smokeless tobacco: Never Used  .  Alcohol Use: No   OB History    No data available     Review of Systems  Neurological: Positive for numbness (paresthesias).  All other systems reviewed and are negative.     Allergies  Aspirin and Penicillins  Home Medications   Prior to Admission medications   Medication Sig Start Date End Date Taking? Authorizing Provider  albuterol (PROVENTIL HFA;VENTOLIN HFA) 108 (90 BASE) MCG/ACT inhaler Inhale 1-2 puffs into the lungs every 6 (six) hours as needed for wheezing or shortness of breath. 04/13/15  Yes Davonna Belling, MD  amLODipine (NORVASC) 10 MG tablet Take 1 tablet (10 mg total) by mouth daily. 06/08/15  Yes Dorena Dew, FNP  ergocalciferol (VITAMIN D2) 50000 UNITS capsule Take 1 capsule (50,000 Units total) by mouth once a week. 05/07/15  Yes Dorena Dew, FNP  insulin detemir (LEVEMIR) 100 UNIT/ML injection Inject 0.15 mLs (15 Units total) into the skin 2 (two) times daily. 05/05/15  Yes Dorena Dew, FNP  losartan (COZAAR) 100 MG tablet Take 1 tablet (100 mg total) by mouth daily. 06/08/15  Yes Dorena Dew, FNP  oxyCODONE-acetaminophen (PERCOCET) 10-325 MG tablet Take 1 tablet by mouth every 8 (eight) hours as needed for pain.   Yes Historical Provider, MD  tiZANidine (ZANAFLEX) 4 MG tablet Take 4 mg by mouth every 6 (six)  hours as needed for muscle spasms. Reported on 07/04/2015   Yes Historical Provider, MD  Blood Glucose Monitoring Suppl (TRUE METRIX AIR GLUCOSE METER) DEVI 1 each by Does not apply route 4 (four) times daily -  before meals and at bedtime. 05/05/15   Dorena Dew, FNP  fluconazole (DIFLUCAN) 150 MG tablet Take 1 tablet (150 mg total) by mouth once. Patient not taking: Reported on 07/04/2015 06/08/15   Dorena Dew, FNP  glucose blood (TRUE METRIX BLOOD GLUCOSE TEST) test strip Use as instructed 05/05/15   Dorena Dew, FNP  insulin aspart (NOVOLOG) 100 UNIT/ML injection Inject 10 Units into the skin 3 (three) times daily before  meals. Patient not taking: Reported on 06/08/2015 04/13/15   Davonna Belling, MD  Lancet Device MISC 1 each by Does not apply route 4 (four) times daily -  before meals and at bedtime. 05/05/15   Dorena Dew, FNP  omeprazole (PRILOSEC) 40 MG capsule Take 1 capsule (40 mg total) by mouth daily. Reported on 06/08/2015 Patient not taking: Reported on 07/04/2015 06/08/15   Dorena Dew, FNP  propranolol ER (INDERAL LA) 60 MG 24 hr capsule Take 1 capsule (60 mg total) by mouth daily. Patient not taking: Reported on 06/08/2015 04/13/15   Davonna Belling, MD   BP 186/97 mmHg  Pulse 77  Resp 20  Ht 5\' 3"  (1.6 m)  SpO2 100%  LMP 06/20/2015   Physical Exam  Constitutional: She is oriented to person, place, and time. She appears well-developed and well-nourished. No distress.  HENT:  Head: Normocephalic and atraumatic.  Mouth/Throat: Oropharynx is clear and moist.  Eyes: Conjunctivae and EOM are normal. Pupils are equal, round, and reactive to light.  Neck: Normal range of motion. Neck supple.  Cardiovascular: Normal rate, regular rhythm and normal heart sounds.   Pulmonary/Chest: Effort normal and breath sounds normal. No respiratory distress. She has no wheezes.  Abdominal: Soft. Bowel sounds are normal. There is no tenderness. There is no guarding.  Musculoskeletal: Normal range of motion. She exhibits no edema.       Right shoulder: Normal. She exhibits no tenderness, no bony tenderness and no pain.       Cervical back: Normal. She exhibits no tenderness, no bony tenderness and no pain.  Sitting with right arm above hand, twitching fingers constantly  Neurological: She is alert and oriented to person, place, and time.  AAOx3, answering questions appropriately; equal strength UE and LE bilaterally; reports decreased sensation and paresthesias of palmar and dorsal right hand compared with left; normal sensation of BLE; CN grossly intact; moves all extremities appropriately without  ataxia; no facial asymmetry appreciated  Skin: Skin is warm and dry. She is not diaphoretic.  Psychiatric: She has a normal mood and affect.  Nursing note and vitals reviewed.   ED Course  Procedures (including critical care time) Labs Review Labs Reviewed  URINALYSIS, ROUTINE W REFLEX MICROSCOPIC (NOT AT [facility]) - Abnormal; Notable for the following:    Glucose, UA >1000 (*)    Hgb urine dipstick TRACE (*)    All other components within normal limits  URINE MICROSCOPIC-ADD ON - Abnormal; Notable for the following:    Squamous Epithelial / LPF 0-5 (*)    Bacteria, UA RARE (*)    All other components within normal limits  CBG MONITORING, ED - Abnormal; Notable for the following:    Glucose-Capillary 489 (*)    All other components within normal limits  CBG MONITORING, ED -  Abnormal; Notable for the following:    Glucose-Capillary 310 (*)    All other components within normal limits   Results for orders placed or performed during the hospital encounter of 07/04/15  Protime-INR  Result Value Ref Range   Prothrombin Time 13.9 11.6 - 15.2 seconds   INR 1.05 0.00 - 1.49  APTT  Result Value Ref Range   aPTT 27 24 - 37 seconds  CBC  Result Value Ref Range   WBC 6.6 4.0 - 10.5 K/uL   RBC 4.16 3.87 - 5.11 MIL/uL   Hemoglobin 12.1 12.0 - 15.0 g/dL   HCT 35.2 (L) 36.0 - 46.0 %   MCV 84.6 78.0 - 100.0 fL   MCH 29.1 26.0 - 34.0 pg   MCHC 34.4 30.0 - 36.0 g/dL   RDW 13.4 11.5 - 15.5 %   Platelets 269 150 - 400 K/uL  Differential  Result Value Ref Range   Neutrophils Relative % 56 %   Neutro Abs 3.7 1.7 - 7.7 K/uL   Lymphocytes Relative 39 %   Lymphs Abs 2.6 0.7 - 4.0 K/uL   Monocytes Relative 4 %   Monocytes Absolute 0.2 0.1 - 1.0 K/uL   Eosinophils Relative 1 %   Eosinophils Absolute 0.1 0.0 - 0.7 K/uL   Basophils Relative 0 %   Basophils Absolute 0.0 0.0 - 0.1 K/uL  Comprehensive metabolic panel  Result Value Ref Range   Sodium 139 135 - 145 mmol/L   Potassium 4.3 3.5 -  5.1 mmol/L   Chloride 107 101 - 111 mmol/L   CO2 24 22 - 32 mmol/L   Glucose, Bld 251 (H) 65 - 99 mg/dL   BUN 13 6 - 20 mg/dL   Creatinine, Ser 0.85 0.44 - 1.00 mg/dL   Calcium 9.4 8.9 - 10.3 mg/dL   Total Protein 7.3 6.5 - 8.1 g/dL   Albumin 3.7 3.5 - 5.0 g/dL   AST 14 (L) 15 - 41 U/L   ALT 11 (L) 14 - 54 U/L   Alkaline Phosphatase 47 38 - 126 U/L   Total Bilirubin 0.4 0.3 - 1.2 mg/dL   GFR calc non Af Amer >60 >60 mL/min   GFR calc Af Amer >60 >60 mL/min   Anion gap 8 5 - 15  I-stat troponin, ED (not at [facility], [facility])  Result Value Ref Range   Troponin i, poc 0.01 0.00 - 0.08 ng/mL   Comment 3          I-Stat Chem 8, ED  (not at [facility], [facility])  Result Value Ref Range   Sodium 140 135 - 145 mmol/L   Potassium 4.1 3.5 - 5.1 mmol/L   Chloride 105 101 - 111 mmol/L   BUN 15 6 - 20 mg/dL   Creatinine, Ser 0.80 0.44 - 1.00 mg/dL   Glucose, Bld 247 (H) 65 - 99 mg/dL   Calcium, Ion 1.24 (H) 1.12 - 1.23 mmol/L   TCO2 23 0 - 100 mmol/L   Hemoglobin 13.3 12.0 - 15.0 g/dL   HCT 39.0 36.0 - 46.0 %   No results found.   Imaging Review Mr Brain Wo Contrast  07/05/2015  CLINICAL DATA:  Initial evaluation for acute right arm with numbness and tingling. EXAM: MRI HEAD WITHOUT CONTRAST MRI CERVICAL SPINE WITHOUT CONTRAST TECHNIQUE: Multiplanar, multiecho pulse sequences of the brain and surrounding structures, and cervical spine, to include the craniocervical junction and cervicothoracic junction, were obtained without intravenous contrast. COMPARISON:  Prior CT from 12/21/2013. FINDINGS:  MRI HEAD FINDINGS Cerebral volume within normal limits for patient age. Patchy T2/FLAIR hyperintensity present within the periventricular and deep white matter, mildly advanced for patient age. Similar changes present within the pons. No abnormal foci of restricted diffusion to suggest acute infarct. Gray-white matter differentiation maintained. Normal intravascular flow voids preserved. No acute or chronic intracranial  hemorrhage. No mass lesion, midline shift, or mass effect. No hydrocephalus. No extra-axial fluid collection. Craniocervical junction within normal limits. Pituitary gland normal.  No acute abnormality about the orbits. Paranasal sinuses are clear. No mastoid effusion. Inner ear structures normal. Bone marrow signal intensity within normal limits. No acute scalp soft tissue abnormality. Small lipoma noted at the right frontal scalp. MRI CERVICAL SPINE FINDINGS Craniocervical junction within normal limits. Straightening of the normal cervical lordosis. No listhesis. Vertebral body heights maintained. No fracture or malalignment. T1 hypo intense, T2/STIR hyperintense lesion within the T1 vertebral body favored to reflect an atypical hemangioma given its course internal trabeculation. This involves the left pedicle and transverse process as well. No associated fracture. No other focal osseous lesion. No other marrow edema. Patient is status post ACDF at C5-6.  Hardware appears well aligned. Signal intensity within the cervical spinal cord is normal. Paraspinous soft tissues within normal limits. No prevertebral edema. Adenoidal soft tissues mildly prominent. C2-C3: Negative. C3-C4: Mild posterior broad-based disc bulge. Minimal bilateral uncovertebral hypertrophy. Very mild bilateral foraminal narrowing. No significant canal stenosis. C4-C5: Mild bilateral uncovertebral hypertrophy with minimal disc bulge. No significant stenosis. C5-C6:  Status post ACDF.  No stenosis. C6-C7: Minimal disc bulge with bilateral uncovertebral hypertrophy. No stenosis. C7-T1:  Negative. Visualized upper thoracic spine demonstrates no abnormality or significant degenerative changes. IMPRESSION: MRI HEAD IMPRESSION: 1. No acute intracranial process. 2. Patchy T2/FLAIR hyperintense foci within the periventricular and deep white matter both cerebral 6 hemispheres, mildly advanced for patient age. These foci are somewhat nonspecific, with  differential considerations including accelerated/hereditary small vessel ischemia (favored), sequela of prior trauma, hypercoagulable state, vasculitis, migraines, prior infection, or possibly underlying demyelinating disease. MRI CERVICAL IMPRESSION: 1. No acute abnormality within the cervical spine. 2. Status post ACDF at 0000000 without complication. 3. Minimal multilevel degenerative disc disease as above. There is resultant mild bilateral foraminal narrowing at C3-4. No other significant stenosis within the cervical spine. 4. 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 Electronically Signed   By: Jeannine Boga M.D.   On: 07/05/2015 00:03   Mr Cervical Spine Wo Contrast  07/05/2015  CLINICAL DATA:  Initial evaluation for acute right arm with numbness and tingling. EXAM: MRI HEAD WITHOUT CONTRAST MRI CERVICAL SPINE WITHOUT CONTRAST TECHNIQUE: Multiplanar, multiecho pulse sequences of the brain and surrounding structures, and cervical spine, to include the craniocervical junction and cervicothoracic junction, were obtained without intravenous contrast. COMPARISON:  Prior CT from 12/21/2013. FINDINGS: MRI HEAD FINDINGS Cerebral volume within normal limits for patient age. Patchy T2/FLAIR hyperintensity present within the periventricular and deep white matter, mildly advanced for patient age. Similar changes present within the pons. No abnormal foci of restricted diffusion to suggest acute infarct. Gray-white matter differentiation maintained. Normal intravascular flow voids preserved. No acute or chronic intracranial hemorrhage. No mass lesion, midline shift, or mass effect. No hydrocephalus. No extra-axial fluid collection. Craniocervical junction within normal limits. Pituitary gland normal.  No acute abnormality about the orbits. Paranasal sinuses are clear. No mastoid effusion. Inner ear structures normal. Bone marrow signal intensity within normal limits. No acute scalp soft tissue abnormality. Small lipoma noted at  the right frontal scalp. MRI  CERVICAL SPINE FINDINGS Craniocervical junction within normal limits. Straightening of the normal cervical lordosis. No listhesis. Vertebral body heights maintained. No fracture or malalignment. T1 hypo intense, T2/STIR hyperintense lesion within the T1 vertebral body favored to reflect an atypical hemangioma given its course internal trabeculation. This involves the left pedicle and transverse process as well. No associated fracture. No other focal osseous lesion. No other marrow edema. Patient is status post ACDF at C5-6.  Hardware appears well aligned. Signal intensity within the cervical spinal cord is normal. Paraspinous soft tissues within normal limits. No prevertebral edema. Adenoidal soft tissues mildly prominent. C2-C3: Negative. C3-C4: Mild posterior broad-based disc bulge. Minimal bilateral uncovertebral hypertrophy. Very mild bilateral foraminal narrowing. No significant canal stenosis. C4-C5: Mild bilateral uncovertebral hypertrophy with minimal disc bulge. No significant stenosis. C5-C6:  Status post ACDF.  No stenosis. C6-C7: Minimal disc bulge with bilateral uncovertebral hypertrophy. No stenosis. C7-T1:  Negative. Visualized upper thoracic spine demonstrates no abnormality or significant degenerative changes. IMPRESSION: MRI HEAD IMPRESSION: 1. No acute intracranial process. 2. Patchy T2/FLAIR hyperintense foci within the periventricular and deep white matter both cerebral 6 hemispheres, mildly advanced for patient age. These foci are somewhat nonspecific, with differential considerations including accelerated/hereditary small vessel ischemia (favored), sequela of prior trauma, hypercoagulable state, vasculitis, migraines, prior infection, or possibly underlying demyelinating disease. MRI CERVICAL IMPRESSION: 1. No acute abnormality within the cervical spine. 2. Status post ACDF at 0000000 without complication. 3. Minimal multilevel degenerative disc disease as above.  There is resultant mild bilateral foraminal narrowing at C3-4. No other significant stenosis within the cervical spine. 4. 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 Electronically Signed   By: Jeannine Boga M.D.   On: 07/05/2015 00:03   I have personally reviewed and evaluated these images and lab results as part of my medical decision-making.   EKG Interpretation None      MDM   Final diagnoses:  Numbness and tingling in right hand   45 year old female here with right hand numbness and paresthesias, onset this morning upon waking at 0600. Went to bed normally last night.  Patient afebrile, nontoxic. She is awake, alert, full oriented.  She endorses altered sensation of palmar and dorsal right hand as well as paresthesias, no other focal neurologic  findings.  No right neck or shoulder pain elicited on exam-- has hx of surgeries to both of these areas without complications.  This is difficult to discern as to peripheral versus central etiology, will obtain MRI. Patient had labs performed at [facility] earlier today but these have been reviewed. He is hyperglycemic, she admits her diabetes has been poorly controlled for years. Will give IV fluids.  After fluids CBG has reduced from 49-310. Her labs are without any findings concerning for DKA.  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 Other potential etiology due to prior trauma, hypercoagulable state, vasculitis, migraines, prior infection, or potential demyelinating disease.  Images have been reviewed with overnight attending physician, Dr. Venora Maples-- does not feel further intervention or admission warranted at this time, rather this may be worked up as an outpatient. Initially recommended 81 mg daily aspirin, however patient reports she had significant retinal hemorrhage with this when she took this previously. Will defer any  anticoagulants to neurology whom patient will follow up with as soon as possible.  Discussed plan with patient, he/she acknowledged understanding and agreed with plan of care.  Return precautions given for new or worsening symptoms.  Larene Pickett, PA-C 07/05/15 [place], MD 07/13/15 [phone]

## 2015-07-05 ENCOUNTER — Ambulatory Visit (INDEPENDENT_AMBULATORY_CARE_PROVIDER_SITE_OTHER): Payer: Medicaid Other | Admitting: Neurology

## 2015-07-05 ENCOUNTER — Encounter: Payer: Self-pay | Admitting: *Deleted

## 2015-07-05 ENCOUNTER — Encounter: Payer: Self-pay | Admitting: Neurology

## 2015-07-05 VITALS — BP 184/105 | HR 84 | Ht 63.0 in | Wt 173.8 lb

## 2015-07-05 DIAGNOSIS — E531 Pyridoxine deficiency: Secondary | ICD-10-CM | POA: Diagnosis not present

## 2015-07-05 DIAGNOSIS — E5111 Dry beriberi: Secondary | ICD-10-CM | POA: Diagnosis not present

## 2015-07-05 DIAGNOSIS — E538 Deficiency of other specified B group vitamins: Secondary | ICD-10-CM | POA: Diagnosis not present

## 2015-07-05 DIAGNOSIS — G63 Polyneuropathy in diseases classified elsewhere: Secondary | ICD-10-CM

## 2015-07-05 DIAGNOSIS — G629 Polyneuropathy, unspecified: Secondary | ICD-10-CM

## 2015-07-05 MED ORDER — GABAPENTIN 300 MG PO CAPS: 600.0000 mg | ORAL_CAPSULE | Freq: Three times a day (TID) | ORAL | Status: DC

## 2015-07-05 MED ORDER — TRAMADOL HCL 50 MG PO TABS: 50.0000 mg | ORAL_TABLET | Freq: Four times a day (QID) | ORAL | Status: DC | PRN

## 2015-07-05 NOTE — Progress Notes (Signed)
[facility]    Provider:  Dr Jaynee Eagles Referring Provider: Dorena Dew, FNP Primary Care Physician:  Dorena Dew, FNP  CC:  Arm pain  HPI:  Christina Orozco is a 45 y.o. female here as a referral from Dr. Smith Robert for right hand numbness and paresthesias. PMHx HTN, DM, asthma, neuropathy, cervical degenerative disease s/p acdf.No falls.Numbnees and tingling in the right arm started at 6am yesterday morning and it hasn't stopped. She has numbness in the entire right arm all the way up toe shoulder. She woke up with the pain, the whole arm. At first she thought it was coming from the shoulder, now not sure.  She does have shoulder problems, has been hurting for years and she has had surgery on it. She is pulling and yanking on it at work, heat on the shoulder didn't help. She is having weakness in the hand. She is not using the hand to drive. She has significant pain 10/10. Numb and tinlging like when her feet fall asleep. She gets sharp radiating pain in the wrist and shoot upwards. Weakness in the grip.  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 Never had any numbness or tingling in the hand in the past. It is the whole hand even the palm  No numbness or weakness of legs. No headache, slurred speech, She has no history of TIA or stroke. No head injuries or falls. No anti-coagulant use.  Reviewed notes, labs and imaging from outside physicians, which showed:  Personally reviewed imaging and agree with the following:  MRI HEAD IMPRESSION:  1. No acute intracranial process. 2. Patchy T2/FLAIR hyperintense foci within the periventricular and deep white matter both cerebral 6 hemispheres, mildly advanced for patient age. These foci are somewhat nonspecific, with differential considerations including accelerated/hereditary small vessel ischemia (favored), sequela of prior trauma, hypercoagulable  state, vasculitis, migraines, prior infection, or possibly underlying demyelinating disease.  MRI CERVICAL IMPRESSION:  1. No acute abnormality within the cervical spine. 2. Status post ACDF at 0000000 without complication. 3. Minimal multilevel degenerative disc disease as above. There is resultant mild bilateral foraminal narrowing at C3-4. No other significant stenosis within the cervical spine. 4. 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  She presented to the ED on 07/03/2014 and woke up with the symptoms. The paresthesias extended into the forearm.Denied weakness. No falls or inciting factors. She was discharged after negative MRI of the brain and cervical cord. Referred to neurology OP. Glucose was in the 400s. She denies falling asleep in a position that would hurt the arm or in a way that would compress the axilla or any part of the arm. Startes she did not sleep on it at all.   Review of Systems: Patient complains of symptoms per HPI as well as the following symptoms: memory loss, confusion, numbness, headache, slurred speech, dizziness. Pertinent negatives per HPI. All others negative.   Social History   Social History  . Marital Status: Single    Spouse Name: N/A  . Number of Children: 3  . Years of Education: 12+   Occupational History  . [employer]     Social History Main Topics  . Smoking status: Never Smoker   . Smokeless tobacco: Never Used  . Alcohol Use: No  . Drug Use: No  . Sexual Activity: Not on file   Other Topics Concern  . Not on file   Social History Narrative  Lives with daughter   Caffeine use: daily    Family History  Problem Relation Age of Onset  . Heart disease    . Cancer      colon, stomach, lung  . Diabetes    . Seizures      Past Medical History  Diagnosis Date  . Diabetes mellitus   . Hypertension   . Asthma   . GERD (gastroesophageal reflux disease)   . Neuropathy ([place])   . DDD (degenerative disc disease), cervical   .  Diabetes mellitus ([place])   . Hypertension     Past Surgical History  Procedure Laterality Date  . Tonsillectomy    . Appendectomy    . Adenoidectomy    . Cervical spine surgery  06/2012    C5-C6  . Shoulder surgery      Current Outpatient Prescriptions  Medication Sig Dispense Refill  . albuterol (PROVENTIL HFA;VENTOLIN HFA) 108 (90 BASE) MCG/ACT inhaler Inhale 1-2 puffs into the lungs every 6 (six) hours as needed for wheezing or shortness of breath. 1 Inhaler 0  . amLODipine (NORVASC) 10 MG tablet Take 1 tablet (10 mg total) by mouth daily. 30 tablet 5  . Blood Glucose Monitoring Suppl (TRUE METRIX AIR GLUCOSE METER) DEVI 1 each by Does not apply route 4 (four) times daily -  before meals and at bedtime. 1 Device 0  . ergocalciferol (VITAMIN D2) 50000 UNITS capsule Take 1 capsule (50,000 Units total) by mouth once a week. 4 capsule 6  . glucose blood (TRUE METRIX BLOOD GLUCOSE TEST) test strip Use as instructed 100 each 12  . insulin detemir (LEVEMIR) 100 UNIT/ML injection Inject 0.15 mLs (15 Units total) into the skin 2 (two) times daily. 10 mL 11  . Lancet Device MISC 1 each by Does not apply route 4 (four) times daily -  before meals and at bedtime. 1 each 11  . losartan (COZAAR) 100 MG tablet Take 1 tablet (100 mg total) by mouth daily. 30 tablet 5  . oxyCODONE-acetaminophen (PERCOCET) 10-325 MG tablet Take 1 tablet by mouth every 8 (eight) hours as needed for pain.    Marland Kitchen tiZANidine (ZANAFLEX) 4 MG tablet Take 4 mg by mouth every 6 (six) hours as needed for muscle spasms. Reported on 07/04/2015    . fluconazole (DIFLUCAN) 150 MG tablet Take 1 tablet (150 mg total) by mouth once. (Patient not taking: Reported on 07/04/2015) 3 tablet 0  . insulin aspart (NOVOLOG) 100 UNIT/ML injection Inject 10 Units into the skin 3 (three) times daily before meals. (Patient not taking: Reported on 06/08/2015) 10 mL 0  . omeprazole (PRILOSEC) 40 MG capsule Take 1 capsule (40 mg total) by mouth daily.  Reported on 06/08/2015 (Patient not taking: Reported on 07/04/2015) 30 capsule 5  . propranolol ER (INDERAL LA) 60 MG 24 hr capsule Take 1 capsule (60 mg total) by mouth daily. (Patient not taking: Reported on 06/08/2015) 30 capsule 0   No current facility-administered medications for this visit.    Allergies as of 07/05/2015 - Review Complete 07/05/2015  Allergen Reaction Noted  . Aspirin Other (See Comments) 12/07/2012  . Penicillins Hives 03/07/2012    Vitals: BP 184/105 mmHg  Pulse 84  Ht 5\' 3"  (1.6 m)  Wt 173 lb 12.8 oz (78.835 kg)  BMI 30.79 kg/m2  LMP 06/20/2015 Last Weight:  Wt Readings from Last 1 Encounters:  07/05/15 173 lb 12.8 oz (78.835 kg)   Last Height:   Ht Readings from Last 1 Encounters:  07/05/15 5\' 3"  (1.6 m)   Physical exam: Exam: Gen: NAD, conversant, well nourised, obese, well groomed                     CV: RRR, no MRG. No Carotid Bruits. No peripheral edema, warm, nontender Eyes: Conjunctivae clear without exudates or hemorrhage  Neuro: Detailed Neurologic Exam  Speech:    Speech is normal; fluent and spontaneous with normal comprehension.  Cognition:    The patient is oriented to person, place, and time;     recent and remote memory intact;     language fluent;     normal attention, concentration,     fund of knowledge Cranial Nerves:    The pupils are equal, round, and reactive to light. The fundi are flat. Visual fields are full to finger confrontation. Extraocular movements are intact. Trigeminal sensation is intact and the muscles of mastication are normal. The face is symmetric. The palate elevates in the midline. Hearing intact. Voice is normal. Shoulder shrug is normal. The tongue has normal motion without fasciculations.   Coordination:    Normal finger to nose and heel to shin.  Gait:   No ataxia  Motor Observation:    No asymmetry, no atrophy, and no involuntary movements noted. Tone:    Normal muscle tone.    Posture:     Posture is normal. normal erect    Strength: right arm biceps and triceps and distal weakness with giveway secondary to severe pain, patient starts crying. Otherwise strength is V/V in the upper and lower limbs. Decreased grip and APb on the right.      Sensation: intact pin prick, temperature and vibration distally in the feet. Decreased to all sensations in the right arm in no dermatomal or nerve pattern, whole arm.      Reflex Exam:  DTR's:    Deep tendon reflexes in the upper and lower extremities are normal bilaterally.   Toes:    The toes are downgoing bilaterally.   Clonus:    Clonus is absent.   Assessment/Plan:  45 y.o. female here as a referral from Dr. Smith Robert for right hand numbness and paresthesias. PMHx HTN, DM, asthma, neuropathy, cervical degenerative disease s/p acdf.No falls.Numbness and tingling and pain (patient crying in the office) in the right arm started at [place] yesterday morning, continuous with weakness. Exam with sensory loss throughout the right arm and right weakness with giveway due to pain. In the setting of increased use of the right arm at a new job and elevated glucose. MRi of the brain and cervical cord negative. Will order an emg/ncs to eval for mononeuropthy or a polyneuropathy. May be an acute pathology in the brachial plexus.    Sarina Ill, MD  [facility] [address] [place], [place] [address]  Phone [phone] Fax [phone]

## 2015-07-05 NOTE — Discharge Instructions (Signed)
Your MRI today was negative for stroke.  There were some slight abnormal findings which we discussed and it is recommended that you follow-up with the neurologist.  Please call them tomorrow to schedule appt. You may also follow-up with your primary care physician in the interim. Return here for any new or worsening symptoms.

## 2015-07-05 NOTE — Patient Instructions (Signed)
Overall you are doing fairly well but I do want to suggest a few things today:   Remember to drink plenty of fluid, eat healthy meals and do not skip any meals. Try to eat protein with a every meal and eat a healthy snack such as fruit or nuts in between meals. Try to keep a regular sleep-wake schedule and try to exercise daily, particularly in the form of walking, 20-30 minutes a day, if you can.   As far as your medications are concerned, I would like to suggest; neurontin 600mg  three times a day. Tramadol as needed. Watch for sedation.  As far as diagnostic testing: EMG/NCS, labs  I would like to see you back for emg/ncs, sooner if we need to. Please call us with any interim questions, concerns, problems, updates or refill requests.   Please also call us for any test results so we can go over those with you on the phone.  My clinical assistant and will answer any of your questions and relay your messages to me and also relay most of my messages to you.   Our phone number is [phone]. We also have an after hours call service for urgent matters and there is a physician on-call for urgent questions. For any emergencies you know to call 911 or go to the nearest emergency room

## 2015-07-06 ENCOUNTER — Other Ambulatory Visit: Payer: Self-pay | Admitting: Family Medicine

## 2015-07-06 DIAGNOSIS — E119 Type 2 diabetes mellitus without complications: Secondary | ICD-10-CM

## 2015-07-06 MED ORDER — INSULIN DETEMIR 100 UNIT/ML ~~LOC~~ SOLN: 15.0000 [IU] | Freq: Two times a day (BID) | SUBCUTANEOUS | Status: DC

## 2015-07-06 MED ORDER — INSULIN ASPART 100 UNIT/ML ~~LOC~~ SOLN: 10.0000 [IU] | Freq: Three times a day (TID) | SUBCUTANEOUS | Status: DC

## 2015-07-06 NOTE — Telephone Encounter (Signed)
Refills for insulin sent into pharmacy. Thanks!

## 2015-07-07 ENCOUNTER — Other Ambulatory Visit: Payer: Self-pay

## 2015-07-07 ENCOUNTER — Ambulatory Visit (INDEPENDENT_AMBULATORY_CARE_PROVIDER_SITE_OTHER): Payer: Medicaid Other | Admitting: Neurology

## 2015-07-07 ENCOUNTER — Ambulatory Visit (INDEPENDENT_AMBULATORY_CARE_PROVIDER_SITE_OTHER): Payer: Self-pay | Admitting: Neurology

## 2015-07-07 ENCOUNTER — Telehealth: Payer: Self-pay | Admitting: *Deleted

## 2015-07-07 ENCOUNTER — Encounter: Payer: Self-pay | Admitting: Neurology

## 2015-07-07 DIAGNOSIS — Z0289 Encounter for other administrative examinations: Secondary | ICD-10-CM

## 2015-07-07 DIAGNOSIS — M25511 Pain in right shoulder: Secondary | ICD-10-CM

## 2015-07-07 DIAGNOSIS — G629 Polyneuropathy, unspecified: Secondary | ICD-10-CM

## 2015-07-07 DIAGNOSIS — G540 Brachial plexus disorders: Secondary | ICD-10-CM | POA: Diagnosis not present

## 2015-07-07 DIAGNOSIS — Z1231 Encounter for screening mammogram for malignant neoplasm of breast: Secondary | ICD-10-CM

## 2015-07-07 DIAGNOSIS — G5603 Carpal tunnel syndrome, bilateral upper limbs: Secondary | ICD-10-CM

## 2015-07-07 DIAGNOSIS — M79601 Pain in right arm: Secondary | ICD-10-CM

## 2015-07-07 MED ORDER — ALBUTEROL SULFATE HFA 108 (90 BASE) MCG/ACT IN AERS: 1.0000 | INHALATION_SPRAY | Freq: Four times a day (QID) | RESPIRATORY_TRACT | Status: DC | PRN

## 2015-07-07 NOTE — Progress Notes (Signed)
  [facility]    Provider: Dr Jaynee Eagles Referring Provider: Dorena Dew, FNP Primary Care Physician: Dorena Dew, FNP  CC: Arm pain  HPI: Christina Orozco is a 45 y.o. female here as a referral from Dr. Smith Robert for right hand numbness and paresthesias. PMHx HTN, DM, asthma, neuropathy, cervical degenerative disease s/p acdf.No falls.Numbnees and tingling in the right arm started at 6am yesterday morning and it hasn't stopped. She has numbness in the entire right arm all the way up toe shoulder. She woke up with the pain, the whole arm. At first she thought it was coming from the shoulder, now not sure. She does have shoulder problems, has been hurting for years and she has had surgery on it. She is pulling and yanking on it at work, heat on the shoulder didn't help. She is having weakness in the hand. She is not using the hand to drive. She has significant pain 10/10. Numb and tinlging like when her feet fall asleep. She gets sharp radiating pain in the wrist and shoot upwards. Weakness in the grip. 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 Never had any numbness or tingling in the hand in the past. It is the whole hand even the palm No numbness or weakness of legs. No headache, slurred speech, She has no history of TIA or stroke. No head injuries or falls. No anti-coagulant use.  Summary:   Nerve Conduction Studies were performed on the bilateral upper extremities and left lower extremity.  The right median APB motor nerve showed prolonged distal onset latency (4.9 ms, N<4.0). The right Median 2nd Digit sensory nerve showed prolonged distal peak latency (4.8 ms, N<3.9). F Wave studies indicate that the right Median F wave has normal latency  The left median APB motor nerve showed prolonged distal onset latency (4.9 ms, N<4.0). The left Median 2nd Digit sensory nerve showed prolonged distal peak  latency (4.5 ms, N<3.9).  F Wave studies indicate that the left Median F wave has normal latency.   Bilateral Ulnar ADM motor nerves were within normal limits. F Wave studies indicate that the bilateral Ulnar F waves have normal latencies The bilateralUlnar 5th digit sensory nerves were within normal limits. The bilateral Radial sensory nerves were within normal limits. The bilateral Medial and Lateral Antebrachial Cutaneous sensory nerves were within normal limits.  The left Peroneal motor nerve showed normal conductions with normal F Wave latency The left Tibial motor nerve showed normal conductions with normal F Wave latency The left Sural sensory nerve conduction was within normal limits   EMG Needle study was performed on selected bilateral upper extremity muscles:   The Supraspinatus, Infraspinatus, Rhomboids, Deltoid, Triceps, Biceps, Brachioradialis, Flexor Digitorum Profundus, Flexor Pollicis longus, Pronator Teres, Opponens Pollicis, First Dorsal interosseous muscles were within normal limits.  Conclusion: This is an abnormal study. There is electrophysiologic evidence of bilateral moderately-severe Carpal Tunnel Syndrome. No suggestion of polyneuropathy, radiculopathy or brachial plexopathy. May be too soon to see brachial plexitis on exam, will repeat in 3 weeks.   Sarina Ill, MD  [facility] [address] [place], [place] [address]  Phone [phone] Fax [phone]

## 2015-07-07 NOTE — Telephone Encounter (Signed)
Received a refill request for proair inhaler. This has been faxed in. Thanks!

## 2015-07-07 NOTE — Progress Notes (Signed)
See procedure note.

## 2015-07-07 NOTE — Procedures (Signed)
  [facility]    Provider: Dr Jaynee Eagles Referring Provider: Dorena Dew, FNP Primary Care Physician: Dorena Dew, FNP  CC: Arm pain  HPI: Christina Orozco is a 45 y.o. female here as a referral from Dr. Smith Robert for right hand numbness and paresthesias. PMHx HTN, DM, asthma, neuropathy, cervical degenerative disease s/p acdf.No falls.Numbnees and tingling in the right arm started at 6am yesterday morning and it hasn't stopped. She has numbness in the entire right arm all the way up toe shoulder. She woke up with the pain, the whole arm. At first she thought it was coming from the shoulder, now not sure. She does have shoulder problems, has been hurting for years and she has had surgery on it. She is pulling and yanking on it at work, heat on the shoulder didn't help. She is having weakness in the hand. She is not using the hand to drive. She has significant pain 10/10. Numb and tinlging like when her feet fall asleep. She gets sharp radiating pain in the wrist and shoot upwards. Weakness in the grip. 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 Never had any numbness or tingling in the hand in the past. It is the whole hand even the palm No numbness or weakness of legs. No headache, slurred speech, She has no history of TIA or stroke. No head injuries or falls. No anti-coagulant use.  Summary:   Nerve Conduction Studies were performed on the bilateral upper extremities and left lower extremity.  The right median APB motor nerve showed prolonged distal onset latency (4.9 ms, N<4.0). The right Median 2nd Digit sensory nerve showed prolonged distal peak latency (4.8 ms, N<3.9). F Wave studies indicate that the right Median F wave has normal latency  The left median APB motor nerve showed prolonged distal onset latency (4.9 ms, N<4.0). The left Median 2nd Digit sensory nerve showed prolonged distal peak  latency (4.5 ms, N<3.9).  F Wave studies indicate that the left Median F wave has normal latency.   Bilateral Ulnar ADM motor nerves were within normal limits. F Wave studies indicate that the bilateral Ulnar F waves have normal latencies The bilateralUlnar 5th digit sensory nerves were within normal limits. The bilateral Radial sensory nerves were within normal limits. The bilateral Medial and Lateral Antebrachial Cutaneous sensory nerves were within normal limits.  The left Peroneal motor nerve showed normal conductions with normal F Wave latency The left Tibial motor nerve showed normal conductions with normal F Wave latency The left Sural sensory nerve conduction was within normal limits   EMG Needle study was performed on selected bilateral upper extremity muscles:   The Supraspinatus, Infraspinatus, Rhomboids, Deltoid, Triceps, Biceps, Brachioradialis, Flexor Digitorum Profundus, Flexor Pollicis longus, Pronator Teres, Opponens Pollicis, First Dorsal interosseous muscles were within normal limits.  Conclusion: This is an abnormal study. There is electrophysiologic evidence of bilateral moderately-severe Carpal Tunnel Syndrome. No suggestion of polyneuropathy, radiculopathy or brachial plexopathy. May be too soon to see brachial plexitis on exam, will repeat in 3 weeks.   Sarina Ill, MD  [facility] [address] [place], [place] [address]  Phone [phone] Fax [phone]

## 2015-07-07 NOTE — Telephone Encounter (Signed)
-----   Message from Melvenia Beam, MD sent at 07/07/2015 10:37 AM EST ----- Labs were ok except for uncontrolled diabetes, she needs to manage her diabetes as it is severely elevated.

## 2015-07-07 NOTE — Telephone Encounter (Signed)
LVM for pt to call about results. Gave [facility] phone number.  

## 2015-07-08 ENCOUNTER — Other Ambulatory Visit: Payer: Self-pay | Admitting: Family Medicine

## 2015-07-08 DIAGNOSIS — E119 Type 2 diabetes mellitus without complications: Secondary | ICD-10-CM

## 2015-07-08 MED ORDER — INSULIN ASPART 100 UNIT/ML FLEXPEN: 10.0000 [IU] | PEN_INJECTOR | Freq: Three times a day (TID) | SUBCUTANEOUS | Status: DC

## 2015-07-08 MED ORDER — INSULIN PEN NEEDLE 31G X 6 MM MISC: 1.0000 | Freq: Three times a day (TID) | Status: DC

## 2015-07-09 LAB — HIV ANTIBODY (ROUTINE TESTING W REFLEX): HIV Screen 4th Generation wRfx: NONREACTIVE

## 2015-07-09 LAB — COMPREHENSIVE METABOLIC PANEL
A/G RATIO: 1.4 (ref 1.1–2.5)
ALBUMIN: 4.2 g/dL (ref 3.5–5.5)
ALT: 11 IU/L (ref 0–32)
AST: 13 IU/L (ref 0–40)
Alkaline Phosphatase: 54 IU/L (ref 39–117)
BILIRUBIN TOTAL: 0.3 mg/dL (ref 0.0–1.2)
BUN/Creatinine Ratio: 13 (ref 9–23)
BUN: 12 mg/dL (ref 6–24)
CALCIUM: 9.3 mg/dL (ref 8.7–10.2)
CHLORIDE: 104 mmol/L (ref 96–106)
CO2: 23 mmol/L (ref 18–29)
Creatinine, Ser: 0.93 mg/dL (ref 0.57–1.00)
GFR calc Af Amer: 88 mL/min/{1.73_m2} (ref >59)
GFR calc non Af Amer: 76 mL/min/{1.73_m2} (ref >59)
GLOBULIN, TOTAL: 2.9 g/dL (ref 1.5–4.5)
Glucose: 300 mg/dL — ABNORMAL HIGH (ref 65–99)
POTASSIUM: 4.4 mmol/L (ref 3.5–5.2)
Sodium: 142 mmol/L (ref 134–144)
TOTAL PROTEIN: 7.1 g/dL (ref 6.0–8.5)

## 2015-07-09 LAB — RHEUMATOID FACTOR

## 2015-07-09 LAB — HEMOGLOBIN A1C
ESTIMATED AVERAGE GLUCOSE: 324 mg/dL
Hgb A1c MFr Bld: 12.9 % — ABNORMAL HIGH (ref 4.8–5.6)

## 2015-07-09 LAB — HEAVY METALS, BLOOD
Arsenic: 10 ug/L (ref 2–23)
LEAD, BLOOD: NOT DETECTED ug/dL (ref 0–19)
Mercury: NOT DETECTED ug/L (ref 0.0–14.9)

## 2015-07-09 LAB — SEDIMENTATION RATE: Sed Rate: 5 mm/hr (ref 0–32)

## 2015-07-09 LAB — VITAMIN B6: VITAMIN B6: 10.3 ug/L (ref 2.0–32.8)

## 2015-07-09 LAB — B12 AND FOLATE PANEL
FOLATE: 4.8 ng/mL (ref >3.0)
Vitamin B-12: 670 pg/mL (ref 211–946)

## 2015-07-09 LAB — VITAMIN B1: Thiamine: 81.6 nmol/L (ref 66.5–200.0)

## 2015-07-09 LAB — HEPATITIS C ANTIBODY

## 2015-07-09 LAB — ANA W/REFLEX: ANA: NEGATIVE

## 2015-07-09 LAB — TSH: TSH: 1.79 u[IU]/mL (ref 0.450–4.500)

## 2015-07-09 LAB — RPR: RPR: NONREACTIVE

## 2015-07-12 ENCOUNTER — Telehealth: Payer: Self-pay | Admitting: Family Medicine

## 2015-07-12 NOTE — Telephone Encounter (Signed)
Patient would like an alternative to [place](medicaid will not cover)

## 2015-07-12 NOTE — Telephone Encounter (Signed)
[place], is there anything else you can do? Medicaid will not cover the lostatin. Please advise. Thanks !

## 2015-07-13 ENCOUNTER — Ambulatory Visit (INDEPENDENT_AMBULATORY_CARE_PROVIDER_SITE_OTHER): Payer: Medicaid Other | Admitting: Family Medicine

## 2015-07-13 ENCOUNTER — Other Ambulatory Visit: Payer: Self-pay | Admitting: Neurology

## 2015-07-13 ENCOUNTER — Other Ambulatory Visit: Payer: Self-pay | Admitting: Family Medicine

## 2015-07-13 VITALS — BP 175/85 | HR 93 | Temp 98.1°F | Resp 16 | Ht 63.0 in | Wt 170.0 lb

## 2015-07-13 DIAGNOSIS — Z8669 Personal history of other diseases of the nervous system and sense organs: Secondary | ICD-10-CM | POA: Diagnosis not present

## 2015-07-13 DIAGNOSIS — I10 Essential (primary) hypertension: Secondary | ICD-10-CM

## 2015-07-13 DIAGNOSIS — E119 Type 2 diabetes mellitus without complications: Secondary | ICD-10-CM | POA: Diagnosis not present

## 2015-07-13 DIAGNOSIS — J4520 Mild intermittent asthma, uncomplicated: Secondary | ICD-10-CM

## 2015-07-13 LAB — GLUCOSE, CAPILLARY: Glucose-Capillary: 367 mg/dL — ABNORMAL HIGH (ref 65–99)

## 2015-07-13 MED ORDER — ALBUTEROL SULFATE HFA 108 (90 BASE) MCG/ACT IN AERS: 1.0000 | INHALATION_SPRAY | Freq: Four times a day (QID) | RESPIRATORY_TRACT | Status: DC | PRN

## 2015-07-13 MED ORDER — LISINOPRIL 20 MG PO TABS: 20.0000 mg | ORAL_TABLET | Freq: Every day | ORAL | Status: DC

## 2015-07-13 NOTE — Progress Notes (Signed)
Subjective:    Patient ID: Christina Orozco, female    DOB: [birth year], 45 y.o.   MRN: [id number]  Hypertension Pertinent negatives include no chest pain, malaise/fatigue, neck pain, orthopnea or palpitations.  Diabetes Pertinent negatives for hypoglycemia include no dizziness. Associated symptoms include fatigue. Pertinent negatives for diabetes include no chest pain, no polydipsia, no polyphagia, no polyuria and no weakness.    [name], a 45 year old female that presents for a 1 month follow up of diabetes and hypertension.  Patient has a history of type 2 diabetes mellitus. Patient reports lower extremity neuropathy, but denies foot ulcerations, increased appetite, nausea, polydipsia, visual disturbances, vomiting, or weight loss. She reports periodic pain to left foot.  She maintains that blood sugars have been consistently greater than 200 fasting. She has been consistently taking Levemir 15 units BID.  Patient is also following up for hypertension.  She is not exercising or following a low sodium diet. She does not check blood pressure at home. She has been out of antihypertensive medication. She maintains that her insurance will not cover losartan.   Patient denies dizziness, chest pain, dyspnea, irregular heart beat, lower extremity edema, palpitations and tachypnea.    Past Medical History  Diagnosis Date  . Diabetes mellitus   . Hypertension   . Asthma   . GERD (gastroesophageal reflux disease)   . Neuropathy ([place])   . DDD (degenerative disc disease), cervical   . Diabetes mellitus ([place])   . Hypertension    Social History   Social History  . Marital Status: Single    Spouse Name: N/A  . Number of Children: 3  . Years of Education: 12+   Occupational History  . [employer]     Social History Main Topics  . Smoking status: Never Smoker   . Smokeless tobacco: Never Used  . Alcohol Use: No  . Drug Use: No  . Sexual Activity: Not on file   Other  Topics Concern  . Not on file   Social History Narrative   Lives with daughter   Caffeine use: daily   Immunization History  Administered Date(s) Administered  . Influenza,inj,Quad PF,36+ Mos 05/05/2015  . Pneumococcal Polysaccharide-23 05/05/2015   Past Surgical History  Procedure Laterality Date  . Tonsillectomy    . Appendectomy    . Adenoidectomy    . Cervical spine surgery  06/2012    C5-C6  . Shoulder surgery     Review of Systems  Constitutional: Positive for fatigue. Negative for malaise/fatigue.  HENT: Negative.   Eyes: Negative.   Respiratory: Negative.   Cardiovascular: Negative.  Negative for chest pain, palpitations, orthopnea and leg swelling.  Gastrointestinal: Negative.   Endocrine: Negative for polydipsia, polyphagia and polyuria.  Genitourinary: Negative.  Negative for urgency, frequency and flank pain.  Musculoskeletal: Positive for myalgias (Right arm pain. Has history of carpal tunnel syndrome). Negative for neck pain.       Left foot pain/neuropathy  Skin: Negative.   Allergic/Immunologic: Negative.  Negative for immunocompromised state.  Neurological: Positive for numbness. Negative for dizziness, weakness and light-headedness.       Neuropathy  Hematological: Negative.   Psychiatric/Behavioral: Negative for suicidal ideas and sleep disturbance.       Objective:   Physical Exam  Constitutional: She is oriented to person, place, and time. She appears well-developed and well-nourished.  HENT:  Head: Normocephalic and atraumatic.  Right Ear: External ear normal.  Left Ear: External ear normal.  Nose:  Nose normal.  Mouth/Throat: Oropharynx is clear and moist.  Eyes: Conjunctivae and EOM are normal. Pupils are equal, round, and reactive to light.  Neck: Normal range of motion. Neck supple.  Cardiovascular: Normal rate, regular rhythm, normal heart sounds and intact distal pulses.   Pulmonary/Chest: Effort normal and breath sounds normal.   Abdominal: Soft. Bowel sounds are normal.  Musculoskeletal: Normal range of motion.  Neurological: She is alert and oriented to person, place, and time. She has normal reflexes. She displays no atrophy. No cranial nerve deficit or sensory deficit. She exhibits normal muscle tone. Gait normal.  Skin: Skin is warm.  Psychiatric: She has a normal mood and affect. Her behavior is normal. Judgment and thought content normal.      BP 175/85 mmHg  Pulse 93  Temp(Src) 98.1 F (36.7 C) (Oral)  Resp 16  Ht 5\' 3"  (1.6 m)  Wt 170 lb (77.111 kg)  BMI 30.12 kg/m2  LMP 06/20/2015 Assessment & Plan:   1. Essential hypertension Blood pressure is not at goal on current medication regimen. Will start Lisinopril 20 mg daily. Will continue Amlodipine to 10 mg daily. Patient is to follow up in office in 1 month for a blood pressure check. Recommend DASH diet, given written information.  2. Type 2 diabetes mellitus without complication, without long-term current use of insulin ([place]) Patient has not been taking medication consistently. There have been some probable non compliance issues here due to lack of payer source. I have discussed with her the great importance of following the treatment plan exactly as directed in order to achieve a good medical outcome. - Glucose, capillary  3. Asthma, mild intermittent, uncomplicated - albuterol (PROVENTIL HFA;VENTOLIN HFA) 108 (90 Base) MCG/ACT inhaler; Inhale 1-2 puffs into the lungs every 6 (six) hours as needed for wheezing or shortness of breath.  Dispense: 1 Inhaler; Refill: 1  RTC: 3 months for hypertension and DMII  Decarlos Empey M, FNP  The patient was given clear instructions to go to ER or return to medical center if symptoms do not improve, worsen or new problems develop. The patient verbalized understanding. Will notify patient with laboratory results.

## 2015-07-14 ENCOUNTER — Encounter: Payer: Self-pay | Admitting: Family Medicine

## 2015-07-14 DIAGNOSIS — J4520 Mild intermittent asthma, uncomplicated: Secondary | ICD-10-CM | POA: Insufficient documentation

## 2015-07-15 ENCOUNTER — Ambulatory Visit
Admission: RE | Admit: 2015-07-15 | Discharge: 2015-07-15 | Disposition: A | Discharge status: Home / Self Care | Payer: Medicaid Other | Source: Ambulatory Visit | Attending: Neurology | Admitting: Neurology

## 2015-07-15 DIAGNOSIS — M25511 Pain in right shoulder: Secondary | ICD-10-CM

## 2015-07-15 DIAGNOSIS — G540 Brachial plexus disorders: Secondary | ICD-10-CM

## 2015-07-15 DIAGNOSIS — G629 Polyneuropathy, unspecified: Secondary | ICD-10-CM

## 2015-07-15 MED ORDER — GADOBENATE DIMEGLUMINE 529 MG/ML IV SOLN
15.0000 mL | Freq: Once | INTRAVENOUS | Status: AC | PRN
  Administered 2015-07-15: 15 mL via INTRAVENOUS

## 2015-07-21 ENCOUNTER — Emergency Department (HOSPITAL_COMMUNITY)
Admission: EM | Admit: 2015-07-21 | Discharge: 2015-07-22 | Disposition: A | Discharge status: Home / Self Care | Payer: Medicaid Other | Attending: Emergency Medicine | Admitting: Emergency Medicine

## 2015-07-21 ENCOUNTER — Encounter (HOSPITAL_COMMUNITY): Payer: Self-pay | Admitting: Emergency Medicine

## 2015-07-21 ENCOUNTER — Emergency Department (HOSPITAL_COMMUNITY): Payer: Medicaid Other

## 2015-07-21 DIAGNOSIS — R079 Chest pain, unspecified: Secondary | ICD-10-CM | POA: Diagnosis present

## 2015-07-21 DIAGNOSIS — Z794 Long term (current) use of insulin: Secondary | ICD-10-CM | POA: Insufficient documentation

## 2015-07-21 DIAGNOSIS — J45909 Unspecified asthma, uncomplicated: Secondary | ICD-10-CM | POA: Diagnosis not present

## 2015-07-21 DIAGNOSIS — G629 Polyneuropathy, unspecified: Secondary | ICD-10-CM | POA: Diagnosis not present

## 2015-07-21 DIAGNOSIS — I10 Essential (primary) hypertension: Secondary | ICD-10-CM | POA: Diagnosis not present

## 2015-07-21 DIAGNOSIS — Z8639 Personal history of other endocrine, nutritional and metabolic disease: Secondary | ICD-10-CM | POA: Insufficient documentation

## 2015-07-21 DIAGNOSIS — Z86711 Personal history of pulmonary embolism: Secondary | ICD-10-CM | POA: Insufficient documentation

## 2015-07-21 DIAGNOSIS — E119 Type 2 diabetes mellitus without complications: Secondary | ICD-10-CM | POA: Insufficient documentation

## 2015-07-21 DIAGNOSIS — R110 Nausea: Secondary | ICD-10-CM | POA: Diagnosis not present

## 2015-07-21 DIAGNOSIS — R200 Anesthesia of skin: Secondary | ICD-10-CM | POA: Insufficient documentation

## 2015-07-21 DIAGNOSIS — K219 Gastro-esophageal reflux disease without esophagitis: Secondary | ICD-10-CM | POA: Insufficient documentation

## 2015-07-21 DIAGNOSIS — Z880 Allergy status to penicillin: Secondary | ICD-10-CM | POA: Diagnosis not present

## 2015-07-21 DIAGNOSIS — R42 Dizziness and giddiness: Secondary | ICD-10-CM | POA: Diagnosis not present

## 2015-07-21 DIAGNOSIS — Z79899 Other long term (current) drug therapy: Secondary | ICD-10-CM | POA: Diagnosis not present

## 2015-07-21 LAB — CBC
HCT: 33.9 % — ABNORMAL LOW (ref 36.0–46.0)
Hemoglobin: 11.7 g/dL — ABNORMAL LOW (ref 12.0–15.0)
MCH: 29 pg (ref 26.0–34.0)
MCHC: 34.5 g/dL (ref 30.0–36.0)
MCV: 84.1 fL (ref 78.0–100.0)
Platelets: 314 10*3/uL (ref 150–400)
RBC: 4.03 MIL/uL (ref 3.87–5.11)
RDW: 13.5 % (ref 11.5–15.5)
WBC: 14.2 10*3/uL — ABNORMAL HIGH (ref 4.0–10.5)

## 2015-07-21 LAB — BASIC METABOLIC PANEL
Anion gap: 11 (ref 5–15)
BUN: 15 mg/dL (ref 6–20)
CALCIUM: 9.6 mg/dL (ref 8.9–10.3)
CHLORIDE: 107 mmol/L (ref 101–111)
CO2: 23 mmol/L (ref 22–32)
CREATININE: 0.87 mg/dL (ref 0.44–1.00)
GFR calc Af Amer: >60 mL/min (ref >60)
GFR calc non Af Amer: >60 mL/min (ref >60)
GLUCOSE: 148 mg/dL — AB (ref 65–99)
Potassium: 3.3 mmol/L — ABNORMAL LOW (ref 3.5–5.1)
Sodium: 141 mmol/L (ref 135–145)

## 2015-07-21 LAB — I-STAT TROPONIN, ED: TROPONIN I, POC: 0 ng/mL (ref 0.00–0.08)

## 2015-07-21 NOTE — ED Notes (Signed)
Pt. reports intermittent left chest pain with nausea onset yesterday , denies SOB or diaphoresis .

## 2015-07-22 ENCOUNTER — Emergency Department (HOSPITAL_COMMUNITY): Payer: Medicaid Other

## 2015-07-22 ENCOUNTER — Encounter (HOSPITAL_COMMUNITY): Payer: Self-pay | Admitting: Radiology

## 2015-07-22 ENCOUNTER — Ambulatory Visit
Admission: RE | Admit: 2015-07-22 | Discharge: 2015-07-22 | Disposition: A | Discharge status: Home / Self Care | Payer: Medicaid Other | Source: Ambulatory Visit

## 2015-07-22 DIAGNOSIS — Z1231 Encounter for screening mammogram for malignant neoplasm of breast: Secondary | ICD-10-CM

## 2015-07-22 LAB — I-STAT TROPONIN, ED: Troponin i, poc: 0.01 ng/mL (ref 0.00–0.08)

## 2015-07-22 MED ORDER — SODIUM CHLORIDE 0.9 % IV BOLUS (SEPSIS)
1000.0000 mL | Freq: Once | INTRAVENOUS | Status: AC
  Administered 2015-07-22: 1000 mL via INTRAVENOUS

## 2015-07-22 MED ORDER — IOHEXOL 350 MG/ML SOLN
100.0000 mL | Freq: Once | INTRAVENOUS | Status: AC | PRN
  Administered 2015-07-22: 60 mL via INTRAVENOUS

## 2015-07-22 NOTE — ED Provider Notes (Signed)
CSN: [id number]     Arrival date & time 07/21/15  2210 History  By signing my name below, I, Altamease Oiler, attest that this documentation has been prepared under the direction and in the presence of Everlene Balls, MD. Electronically Signed: Altamease Oiler, ED Scribe. 07/22/2015. 12:29 AM    Chief Complaint  Patient presents with  . Chest Pain    The history is provided by the patient. No language interpreter was used.   Christina Orozco is a 45 y.o. female with history of PE, HTN, DM, asthma, and GERD who presents to the Emergency Department complaining of intermittent, non-radiating, stabbing, 10/10 in severity,  left sided chest pain with onset yesterday. This pain is different than pain that she had in the past with a PE.  The pain has no identifiable triggers or modifying factors. She is pain free at present.   Associated symptoms include nausea, dizziness, and numbness at the jaw. Pt denies SOB, vomiting, sweating, cough, fever. No history of cardiac disease. She is not currently on a blood thinner.   Past Medical History  Diagnosis Date  . Diabetes mellitus   . Hypertension   . Asthma   . GERD (gastroesophageal reflux disease)   . Neuropathy ([place])   . DDD (degenerative disc disease), cervical   . Diabetes mellitus ([place])   . Hypertension    Past Surgical History  Procedure Laterality Date  . Tonsillectomy    . Appendectomy    . Adenoidectomy    . Cervical spine surgery  06/2012    C5-C6  . Shoulder surgery     Family History  Problem Relation Age of Onset  . Heart disease    . Cancer      colon, stomach, lung  . Diabetes    . Seizures     Social History  Substance Use Topics  . Smoking status: Never Smoker   . Smokeless tobacco: Never Used  . Alcohol Use: No   OB History    No data available     Review of Systems  10 Systems reviewed and all are negative for acute change except as noted in the HPI.   Allergies  Aspirin and Penicillins  Home  Medications   Prior to Admission medications   Medication Sig Start Date End Date Taking? Authorizing Provider  albuterol (PROVENTIL HFA;VENTOLIN HFA) 108 (90 Base) MCG/ACT inhaler Inhale 1-2 puffs into the lungs every 6 (six) hours as needed for wheezing or shortness of breath. 07/13/15  Yes Dorena Dew, FNP  amLODipine (NORVASC) 10 MG tablet Take 1 tablet (10 mg total) by mouth daily. 06/08/15  Yes Dorena Dew, FNP  ergocalciferol (VITAMIN D2) 50000 UNITS capsule Take 1 capsule (50,000 Units total) by mouth once a week. 05/07/15  Yes Dorena Dew, FNP  gabapentin (NEURONTIN) 300 MG capsule Take 2 capsules (600 mg total) by mouth 3 (three) times daily. 07/05/15  Yes Melvenia Beam, MD  insulin aspart (NOVOLOG) 100 UNIT/ML FlexPen Inject 10 Units into the skin 3 (three) times daily with meals. 07/08/15  Yes Dorena Dew, FNP  insulin detemir (LEVEMIR) 100 UNIT/ML injection Inject 0.15 mLs (15 Units total) into the skin 2 (two) times daily. 07/06/15  Yes Dorena Dew, FNP  lisinopril (PRINIVIL,ZESTRIL) 20 MG tablet TAKE 1 TABLET(20 MG) BY MOUTH DAILY 07/13/15  Yes Dorena Dew, FNP  omeprazole (PRILOSEC) 40 MG capsule Take 1 capsule (40 mg total) by mouth daily. Reported on 06/08/2015 06/08/15  Yes Lovett Sox  Durenda Guthrie, FNP  oxyCODONE-acetaminophen (PERCOCET) 10-325 MG tablet Take 1 tablet by mouth every 8 (eight) hours as needed for pain. Reported on 07/13/2015   Yes Historical Provider, MD  predniSONE (DELTASONE) 10 MG tablet Take 10 mg by mouth daily with breakfast. 07/12/15 07/26/15 Yes Historical Provider, MD  tiZANidine (ZANAFLEX) 4 MG tablet Take 4 mg by mouth every 6 (six) hours as needed for muscle spasms. Reported on 07/13/2015   Yes Historical Provider, MD  Blood Glucose Monitoring Suppl (TRUE METRIX AIR GLUCOSE METER) DEVI 1 each by Does not apply route 4 (four) times daily -  before meals and at bedtime. 05/05/15   Dorena Dew, FNP  glucose blood (TRUE METRIX BLOOD  GLUCOSE TEST) test strip Use as instructed 05/05/15   Dorena Dew, FNP  Insulin Pen Needle 31G X 6 MM MISC 1 each by Does not apply route 4 (four) times daily - after meals and at bedtime. 07/08/15   Dorena Dew, FNP  Lancet Device MISC 1 each by Does not apply route 4 (four) times daily -  before meals and at bedtime. 05/05/15   Dorena Dew, FNP  traMADol (ULTRAM) 50 MG tablet Take 1 tablet (50 mg total) by mouth every 6 (six) hours as needed. Patient not taking: Reported on 07/13/2015 07/05/15   Melvenia Beam, MD   BP 164/78 mmHg  Pulse 81  Temp(Src) 98.7 F (37.1 C) (Oral)  Resp 22  SpO2 100%  LMP 07/16/2015 (Approximate) Physical Exam  Constitutional: She is oriented to person, place, and time. She appears well-developed and well-nourished. No distress.  HENT:  Head: Normocephalic and atraumatic.  Nose: Nose normal.  Mouth/Throat: Oropharynx is clear and moist. No oropharyngeal exudate.  Eyes: Conjunctivae and EOM are normal. Pupils are equal, round, and reactive to light. No scleral icterus.  Neck: Normal range of motion. Neck supple. No JVD present. No tracheal deviation present. No thyromegaly present.  Cardiovascular: Normal rate, regular rhythm and normal heart sounds.  Exam reveals no gallop and no friction rub.   No murmur heard. Pulmonary/Chest: Effort normal and breath sounds normal. No respiratory distress. She has no wheezes. She exhibits no tenderness.  Abdominal: Soft. Bowel sounds are normal. She exhibits no distension and no mass. There is no tenderness. There is no rebound and no guarding.  Musculoskeletal: Normal range of motion. She exhibits no edema or tenderness.  Lymphadenopathy:    She has no cervical adenopathy.  Neurological: She is alert and oriented to person, place, and time. No cranial nerve deficit. She exhibits normal muscle tone.  Skin: Skin is warm and dry. No rash noted. No erythema. No pallor.  Nursing note and vitals reviewed.   ED  Course  Procedures (including critical care time) DIAGNOSTIC STUDIES: Oxygen Saturation is 100% on RA,  normal by my interpretation.    COORDINATION OF CARE: 12:17 AM Discussed treatment plan which includes lab work, CXR, EKG  with pt at bedside and pt agreed to plan.  Labs Review Labs Reviewed  BASIC METABOLIC PANEL - Abnormal; Notable for the following:    Potassium 3.3 (*)    Glucose, Bld 148 (*)    All other components within normal limits  CBC - Abnormal; Notable for the following:    WBC 14.2 (*)    Hemoglobin 11.7 (*)    HCT 33.9 (*)    All other components within normal limits  I-STAT TROPOININ, ED  Randolm Idol, ED    Imaging Review Dg  Chest 2 View  07/21/2015  CLINICAL DATA:  Acute onset of left-sided chest pain. Initial encounter. EXAM: CHEST  2 VIEW COMPARISON:  Chest radiograph performed 12/07/2012 FINDINGS: The lungs are well-aerated and clear. There is no evidence of focal opacification, pleural effusion or pneumothorax. The heart is normal in size; the mediastinal contour is within normal limits. No acute osseous abnormalities are seen. Cervical spinal fusion hardware is noted. IMPRESSION: No acute cardiopulmonary process seen. Electronically Signed   By: Garald Balding M.D.   On: 07/21/2015 23:53   Ct Angio Chest Pe W/cm &/or Wo Cm  07/22/2015  CLINICAL DATA:  Acute onset of left-sided chest pain. Dizziness, nausea and lightheadedness. Initial encounter. EXAM: CT ANGIOGRAPHY CHEST WITH CONTRAST TECHNIQUE: Multidetector CT imaging of the chest was performed using the standard protocol during bolus administration of intravenous contrast. Multiplanar CT image reconstructions and MIPs were obtained to evaluate the vascular anatomy. CONTRAST:  13mL OMNIPAQUE IOHEXOL 350 MG/ML SOLN COMPARISON:  Chest radiograph performed 07/21/2015, and MRI of the right upper chest performed 07/15/2015 FINDINGS: There is no evidence of pulmonary embolus. Minimal bibasilar atelectasis is  noted. The lungs are otherwise clear. There is no evidence of significant focal consolidation, pleural effusion or pneumothorax. No masses are identified; no abnormal focal contrast enhancement is seen. The mediastinum is grossly unremarkable in appearance. Apparent prominence of the ventricular myocardium is thought to reflect normal systole, though echocardiogram could be considered for further evaluation if deemed clinically appropriate. No pericardial effusion is identified. No mediastinal lymphadenopathy is seen. The great vessels are grossly unremarkable. No axillary lymphadenopathy is seen. The visualized portions of the thyroid gland are unremarkable in appearance. The visualized portions of the liver and spleen are unremarkable. The visualized portions of the pancreas, stomach, adrenal glands and kidneys are within normal limits. No acute osseous abnormalities are seen. Review of the MIP images confirms the above findings. IMPRESSION: 1. No evidence of pulmonary embolus. 2. Minimal bibasilar atelectasis noted.  Lungs otherwise clear. 3. Apparent prominence of the ventricular myocardium is thought to reflect normal systole, though echocardiogram could be considered for further evaluation if deemed clinically appropriate. Electronically Signed   By: Garald Balding M.D.   On: 07/22/2015 01:51   I have personally reviewed and evaluated these images and lab results as part of my medical decision-making.   EKG Interpretation   Date/Time:  Wednesday July 21 2015 22:15:34 EST Ventricular Rate:  98 PR Interval:  126 QRS Duration: 72 QT Interval:  340 QTC Calculation: 434 R Axis:   62 Text Interpretation:  Normal sinus rhythm Possible Left atrial enlargement  Septal infarct , age undetermined Abnormal ECG No significant change since  last tracing Confirmed by Glynn Octave [phone]) on 07/22/2015  12:06:04 AM      MDM   Final diagnoses:  None   Patient presents to the ED for vague CP  symptoms.  Difficult to obtain a good history from her.  She has a history of PE in the past, so will obtain CT for evaluation.  From what I can tell, her CP history is not consistent with ACS.  She denies any vomiting or diaphoresis.  There is no exertional component.  Will utilize the HEART score and 2 sets of troponins for evaluation.  Initial score is 1 due to risk factors.  Repeat troponin is negative. EKG is unchanged. CT scan does not reveal any pulmonary embolism. Patient continues to appear well, resting comfortable in bed and in no acute distress.  Primary care follow-up advised in 3 days. Vital signs within her normal limits and she is safe for discharge.  I personally performed the services described in this documentation, which was scribed in my presence. The recorded information has been reviewed and is accurate.     Everlene Balls, MD 07/22/15 [place] [place]

## 2015-07-22 NOTE — ED Notes (Signed)
Pt left with all her belongings and ambulated out of the treatment area.  

## 2015-07-22 NOTE — Discharge Instructions (Signed)
Nonspecific Chest Pain Christina Orozco, your blood work and CT scan today was normal.  See a primary care doctor within 3 days for close follow up.  You need to have a stress test performed.  If any symptoms worsen, come back to the ED immediately. Thank you. It is often hard to find the cause of chest pain. There is always a chance that your pain could be related to something serious, such as a heart attack or a blood clot in your lungs. Chest pain can also be caused by conditions that are not life-threatening. If you have chest pain, it is very important to follow up with your doctor.  HOME CARE  If you were prescribed an antibiotic medicine, finish it all even if you start to feel better.  Avoid any activities that cause chest pain.  Do not use any tobacco products, including cigarettes, chewing tobacco, or electronic cigarettes. If you need help quitting, ask your doctor.  Do not drink alcohol.  Take medicines only as told by your doctor.  Keep all follow-up visits as told by your doctor. This is important. This includes any further testing if your chest pain does not go away.  Your doctor may tell you to keep your head raised (elevated) while you sleep.  Make lifestyle changes as told by your doctor. These may include:  Getting regular exercise. Ask your doctor to suggest some activities that are safe for you.  Eating a heart-healthy diet. Your doctor or a diet specialist (dietitian) can help you to learn healthy eating options.  Maintaining a healthy weight.  Managing diabetes, if necessary.  Reducing stress. GET HELP IF:  Your chest pain does not go away, even after treatment.  You have a rash with blisters on your chest.  You have a fever. GET HELP RIGHT AWAY IF:  Your chest pain is worse.  You have an increasing cough, or you cough up blood.  You have severe belly (abdominal) pain.  You feel extremely weak.  You pass out (faint).  You have chills.  You have  sudden, unexplained chest discomfort.  You have sudden, unexplained discomfort in your arms, back, neck, or jaw.  You have shortness of breath at any time.  You suddenly start to sweat, or your skin gets clammy.  You feel nauseous.  You vomit.  You suddenly feel light-headed or dizzy.  Your heart begins to beat quickly, or it feels like it is skipping beats. These symptoms may be an emergency. Do not wait to see if the symptoms will go away. Get medical help right away. Call your local emergency services (911 in the [place] Do not drive yourself to the hospital.   This information is not intended to replace advice given to you by your health care provider. Make sure you discuss any questions you have with your health care provider.   Document Released: 11/15/2007 Document Revised: 06/19/2014 Document Reviewed: 01/02/2014 Elsevier Interactive Patient Education [employer].

## 2015-07-23 ENCOUNTER — Encounter: Payer: Self-pay | Admitting: Family Medicine

## 2015-07-23 ENCOUNTER — Ambulatory Visit (INDEPENDENT_AMBULATORY_CARE_PROVIDER_SITE_OTHER): Payer: Medicaid Other | Admitting: Family Medicine

## 2015-07-23 VITALS — BP 165/83 | HR 95 | Temp 98.2°F | Resp 16 | Ht 63.0 in | Wt 176.0 lb

## 2015-07-23 DIAGNOSIS — H8110 Benign paroxysmal vertigo, unspecified ear: Secondary | ICD-10-CM

## 2015-07-23 MED ORDER — MECLIZINE HCL 32 MG PO TABS: 32.0000 mg | ORAL_TABLET | Freq: Three times a day (TID) | ORAL | Status: DC | PRN

## 2015-07-23 MED ORDER — MECLIZINE HCL 25 MG PO TABS: 25.0000 mg | ORAL_TABLET | Freq: Three times a day (TID) | ORAL | Status: DC | PRN

## 2015-07-23 NOTE — Patient Instructions (Signed)
Plenty of liquids. Follow-up here if symptoms not resolving over next few days Follow-up in ED if symptoms get worse or you develop new worrisome symptoms.

## 2015-07-23 NOTE — Progress Notes (Signed)
Patient ID: Christina Orozco, female   DOB: 04/04/1971, 45 y.o.   MRN: [id number]   Christina Orozco, is a 45 y.o. female  [id number]  [id number]  DOB - 08-05-1970  CC:  Chief Complaint  Patient presents with  . Follow-up    er follow up for chest pain, dizziness, nausea        HPI: Christina Orozco is a 45 y.o. female here to follow-up on an ED visit 2 days ago. She presented with chest pain, dizziness, nausea. A cardiac origin was ruled out. She was asked to follow-up with [name] in 3 days.  She reports having no further chest pain.but continues to have occassional dizziness which she describes at room spinning.. She does report some associated nausea.  She had bloodwork in the ED which did not reveal a cause for the dizziness.  Her white count was 14.9 and her potassium 3.3. She denies any new medications of situations that could be related.  She does have hypertension and diabetes. Her blood pressure today is 165/83.   ED Note:  Christina Orozco is a 45 y.o. female with history of PE, HTN, DM, asthma, and GERD who presents to the Emergency Department complaining of intermittent, non-radiating, stabbing, 10/10 in severity, left sided chest pain with onset yesterday. This pain is different than pain that she had in the past with a PE. The pain has no identifiable triggers or modifying factors. She is pain free at present. Associated symptoms include nausea, dizziness, and numbness at the jaw. Pt denies SOB, vomiting, sweating, cough, fever. No history of cardiac disease. She is not currently on a blood thinner.    Allergies  Allergen Reactions  . Aspirin Other (See Comments)    Not to use due to laser eye surgery  . Penicillins Hives   Past Medical History  Diagnosis Date  . Diabetes mellitus   . Hypertension   . Asthma   . GERD (gastroesophageal reflux disease)   . Neuropathy ([place])   . DDD (degenerative disc disease), cervical   . Diabetes mellitus ([place])   .  Hypertension    Current Outpatient Prescriptions on File Prior to Visit  Medication Sig Dispense Refill  . albuterol (PROVENTIL HFA;VENTOLIN HFA) 108 (90 Base) MCG/ACT inhaler Inhale 1-2 puffs into the lungs every 6 (six) hours as needed for wheezing or shortness of breath. 1 Inhaler 1  . amLODipine (NORVASC) 10 MG tablet Take 1 tablet (10 mg total) by mouth daily. 30 tablet 5  . Blood Glucose Monitoring Suppl (TRUE METRIX AIR GLUCOSE METER) DEVI 1 each by Does not apply route 4 (four) times daily -  before meals and at bedtime. 1 Device 0  . ergocalciferol (VITAMIN D2) 50000 UNITS capsule Take 1 capsule (50,000 Units total) by mouth once a week. 4 capsule 6  . gabapentin (NEURONTIN) 300 MG capsule Take 2 capsules (600 mg total) by mouth 3 (three) times daily. 90 capsule 11  . glucose blood (TRUE METRIX BLOOD GLUCOSE TEST) test strip Use as instructed 100 each 12  . insulin aspart (NOVOLOG) 100 UNIT/ML FlexPen Inject 10 Units into the skin 3 (three) times daily with meals. 15 mL 11  . insulin detemir (LEVEMIR) 100 UNIT/ML injection Inject 0.15 mLs (15 Units total) into the skin 2 (two) times daily. 10 mL 3  . Insulin Pen Needle 31G X 6 MM MISC 1 each by Does not apply route 4 (four) times daily - after meals and at bedtime. 100 each 5  . Lancet  Device MISC 1 each by Does not apply route 4 (four) times daily -  before meals and at bedtime. 1 each 11  . lisinopril (PRINIVIL,ZESTRIL) 20 MG tablet TAKE 1 TABLET(20 MG) BY MOUTH DAILY 90 tablet 0  . omeprazole (PRILOSEC) 40 MG capsule Take 1 capsule (40 mg total) by mouth daily. Reported on 06/08/2015 30 capsule 5  . predniSONE (DELTASONE) 10 MG tablet Take 10 mg by mouth daily with breakfast.    . oxyCODONE-acetaminophen (PERCOCET) 10-325 MG tablet Take 1 tablet by mouth every 8 (eight) hours as needed for pain. Reported on 07/23/2015    . tiZANidine (ZANAFLEX) 4 MG tablet Take 4 mg by mouth every 6 (six) hours as needed for muscle spasms. Reported on  07/23/2015    . traMADol (ULTRAM) 50 MG tablet Take 1 tablet (50 mg total) by mouth every 6 (six) hours as needed. (Patient not taking: Reported on 07/13/2015) 60 tablet 1   No current facility-administered medications on file prior to visit.   Family History  Problem Relation Age of Onset  . Heart disease    . Cancer      colon, stomach, lung  . Diabetes    . Seizures     Social History   Social History  . Marital Status: Single    Spouse Name: N/A  . Number of Children: 3  . Years of Education: 12+   Occupational History  . [employer]     Social History Main Topics  . Smoking status: Never Smoker   . Smokeless tobacco: Never Used  . Alcohol Use: No  . Drug Use: No  . Sexual Activity: Not on file   Other Topics Concern  . Not on file   Social History Narrative   Lives with daughter   Caffeine use: daily    Review of Systems: Constitutional: Negative for fever, chills, appetite change, weight loss,  Fatigue. Skin: Negative for rashes or lesions of concern. HENT: Negative for ear pain, ear discharge.nose bleeds Eyes: Negative for pain, discharge, redness, itching and visual disturbance. Neck: Negative for pain, stiffness Respiratory: Negative for cough, shortness of breath,   Cardiovascular: Negative for chest pain, palpitations and leg swelling. Gastrointestinal: Negative for abdominal pain, nausea, vomiting, diarrhea, constipations Genitourinary: Negative for dysuria, urgency, frequency, hematuria,  Musculoskeletal: Negative for back pain, joint pain, joint  swelling, and gait problem.Negative for weakness. Neurological: Negative for  tremors, seizures, syncope,   light-headedness, numbness and headaches. Positive for dizziness (vertigo) Hematological: Negative for easy bruising or bleeding Psychiatric/Behavioral: Negative for depression, anxiety, decreased concentration, confusion   Objective:   Filed Vitals:   07/23/15 1507  BP: 165/83  Pulse: 95   Temp: 98.2 F (36.8 C)  Resp: 16    Physical Exam: Constitutional: Patient appears well-developed and well-nourished. No distress. HENT: Normocephalic, atraumatic, External right and left ear normal. Oropharynx is clear and moist.  Eyes: Conjunctivae and EOM are normal. PERRLA, no scleral icterus. Neck: Normal ROM. Neck supple. No lymphadenopathy, No thyromegaly. CVS: RRR, S1/S2 +, no murmurs, no gallops, no rubs Pulmonary: Effort and breath sounds normal, no stridor, rhonchi, wheezes, rales.  Abdominal: Soft. Normoactive BS,, no distension, tenderness, rebound or guarding.  Musculoskeletal: Normal range of motion. No edema and no tenderness.  Neuro: Alert, oriented and appropriate. PERL, EOMS intact, facial movement intact and cemetrical. Unable to check shoulder lift, arm drift or grips due to arm injury. Tandem and Romberg intact. Skin: Skin is warm and dry. No rash noted. Not  diaphoretic. No erythema. No pallor. Psychiatric: Normal mood and affect. Behavior, judgment, thought content normal.  Lab Results  Component Value Date   WBC 14.2* 07/21/2015   HGB 11.7* 07/21/2015   HCT 33.9* 07/21/2015   MCV 84.1 07/21/2015   PLT 314 07/21/2015   Lab Results  Component Value Date   CREATININE 0.87 07/21/2015   BUN 15 07/21/2015   NA 141 07/21/2015   K 3.3* 07/21/2015   CL 107 07/21/2015   CO2 23 07/21/2015    Lab Results  Component Value Date   HGBA1C 12.9* 07/05/2015   Lipid Panel  No results found for: CHOL, TRIG, HDL, CHOLHDL, VLDL, LDLCALC     Assessment and plan:   1. Benign paroxysmal positional vertigo, unspecified lateralit - meclizine (ANTIVERT) 25 MG tablet; Take 1 tablet (25 mg total) by mouth 3 (three) times daily as needed for dizziness.  Dispense: 30 tablet; Refill: 0    Follow-up next week if symptoms not improving Follow-up in ED for worsening of symptoms or new worrisome symptoms.  The patient was given clear instructions to go to ER or return to  medical center if symptoms don't improve, worsen or new problems develop. The patient verbalized understanding.    Micheline Chapman FNP  07/23/2015, 3:55 PM

## 2015-07-28 ENCOUNTER — Other Ambulatory Visit: Payer: Self-pay | Admitting: Neurology

## 2015-07-28 ENCOUNTER — Telehealth: Payer: Self-pay

## 2015-07-28 ENCOUNTER — Ambulatory Visit (INDEPENDENT_AMBULATORY_CARE_PROVIDER_SITE_OTHER): Payer: Medicaid Other | Admitting: Neurology

## 2015-07-28 ENCOUNTER — Ambulatory Visit (INDEPENDENT_AMBULATORY_CARE_PROVIDER_SITE_OTHER): Payer: Self-pay | Admitting: Neurology

## 2015-07-28 DIAGNOSIS — M79601 Pain in right arm: Secondary | ICD-10-CM

## 2015-07-28 DIAGNOSIS — Z0289 Encounter for other administrative examinations: Secondary | ICD-10-CM

## 2015-07-28 DIAGNOSIS — R9082 White matter disease, unspecified: Secondary | ICD-10-CM

## 2015-07-28 MED ORDER — ALPRAZOLAM 0.5 MG PO TABS: ORAL_TABLET | ORAL | Status: DC

## 2015-07-28 NOTE — Progress Notes (Signed)
See procedure note.

## 2015-07-28 NOTE — Telephone Encounter (Signed)
-----   Message from Melvenia Beam, MD sent at 07/28/2015  4:35 PM EST ----- MRI of the brachial plexus normal thanks

## 2015-07-28 NOTE — Telephone Encounter (Signed)
Left message informing patient of normal brachial plexus results.

## 2015-07-29 ENCOUNTER — Telehealth: Payer: Self-pay | Admitting: Neurology

## 2015-07-29 NOTE — Telephone Encounter (Signed)
LVM at [facility] to schedule pt for LP. Gave pt name and DOBx2 Gave [facility] phone number if they have further questions.

## 2015-07-29 NOTE — Telephone Encounter (Signed)
Emma, I placed a referral for lumbar puncture for this patient. Need Multiple Sclerosis protocol. Can you call [facility] please? thanks

## 2015-08-02 NOTE — Progress Notes (Signed)
Provider: Dr Jaynee Eagles Referring Provider: Dorena Dew, FNP Primary Care Physician: Dorena Dew, FNP  CC: Arm pain  HPI: Christina Orozco is a 45 y.o. female here as a referral from Dr. Smith Robert for right hand numbness and paresthesias. PMHx HTN, DM, asthma, neuropathy, cervical degenerative disease s/p acdf.No falls.Numbnees and tingling in the right arm started at 6am yesterday morning and it hasn't stopped. She has numbness in the entire right arm all the way up toe shoulder. She woke up with the pain, the whole arm. At first she thought it was coming from the shoulder, now not sure. She does have shoulder problems, has been hurting for years and she has had surgery on it. She is pulling and yanking on it at work, heat on the shoulder didn't help. She is having weakness in the hand. She is not using the hand to drive. She has significant pain 10/10. Numb and tinlging like when her feet fall asleep. She gets sharp radiating pain in the wrist and shoot upwards. Weakness in the grip. 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 Never had any numbness or tingling in the hand in the past. It is the whole hand even the palm No numbness or weakness of legs. No headache, slurred speech, She has no history of TIA or stroke. No head injuries or falls. No anti-coagulant use.  Summary:   EMG nerve conduction studies were repeated after 3 weeks to ensure that normal EMG and nerve conduction studies were not due to not enough time since the onset of symptoms to see changes on the study.    Nerve Conduction Studies were performed on the bilateral upper extremities and left lower extremity.  The right median APB motor nerve showed prolonged distal onset latency (4.9 ms, N<4.0). The right Median 2nd Digit sensory nerve showed prolonged distal peak latency (4.8 ms, N<3.9). F Wave studies indicate that the right Median F wave has normal  latency  The left median APB motor nerve showed prolonged distal onset latency (4.9 ms, N<4.0). The left Median 2nd Digit sensory nerve showed prolonged distal peak latency (4.5 ms, N<3.9).  F Wave studies indicate that the left Median F wave has normal latency.   Bilateral Ulnar ADM motor nerves were within normal limits. F Wave studies indicate that the bilateral Ulnar F waves have normal latencies The bilateralUlnar 5th digit sensory nerves were within normal limits. The bilateral Radial sensory nerves were within normal limits. The bilateral Medial and Lateral Antebrachial Cutaneous sensory nerves were within normal limits.  The left Peroneal motor nerve showed normal conductions with normal F Wave latency The left Tibial motor nerve showed normal conductions with normal F Wave latency The left Sural sensory nerve conduction was within normal limits   EMG Needle study was performed on selected bilateral upper extremity muscles:   The Supraspinatus, Infraspinatus, Rhomboids, Deltoid, Triceps, Biceps, Brachioradialis, Flexor Digitorum Profundus, Flexor Pollicis longus, Pronator Teres, Opponens Pollicis, First Dorsal interosseous muscles were within normal limits.  Conclusion: This is an abnormal study. There is electrophysiologic evidence of bilateral moderately-severe Carpal Tunnel Syndrome. No suggestion of polyneuropathy, radiculopathy or brachial plexopathy. May be too soon to see brachial plexitis on exam, will repeat in 3 weeks.   EMG nerve conduction studies were repeated after 3 weeks to ensure that normal EMG and nerve conduction studies were not due to not enough time since the onset of symptoms to see changes  on the study.  EMG nerve conduction study was within normal cannot explain the patient's severe right arm pain and weakness. Given white matter abnormalities on MRI of the brain at this point feel lumbar puncture to evaluate for demyelinating disease is appropriate.  Discussed with patient and ordered a lumbar puncture.  Sarina Ill, MD  [facility] [address] [place], [place] [address]  Phone [phone] Fax [phone]

## 2015-08-10 ENCOUNTER — Other Ambulatory Visit: Payer: Medicaid Other

## 2015-08-11 ENCOUNTER — Telehealth: Payer: Self-pay | Admitting: Neurology

## 2015-08-11 NOTE — Telephone Encounter (Signed)
Called pt back. Advised I will have to speak to Dr Jaynee Eagles about need for letter. Recommended FMLA paperwork, but she stated this is not for work, but for DSS. She dropped off paperwork and was told it was going to be a 25 dollar fee, but she stated she cannot afford that. She would like Dr Jaynee Eagles to write a letter instead. She states she spoke to Dr Jaynee Eagles at last procedure visit about this.

## 2015-08-11 NOTE — Telephone Encounter (Signed)
Patient is wondering if Dr. Jaynee Eagles could write a letter for her saying that she has been out of work and the reason she has been out of work also wants to know if she has to pay for that letter. The best number to contact the patient is [phone]

## 2015-08-11 NOTE — Telephone Encounter (Signed)
Do we have the paperwork so I can look at it and see what they ask for?I'm not sure what the letter should say? Also, did she schedule her lumbar puncture? I placed the order, did [facility] call her?

## 2015-08-12 ENCOUNTER — Encounter: Payer: Self-pay | Admitting: *Deleted

## 2015-08-12 NOTE — Telephone Encounter (Signed)
[name], write a letter. I advised her to stay out of work that month due to severe right arm pain until the workup was complete. thanks

## 2015-08-12 NOTE — Telephone Encounter (Signed)
Called pt back. She just needs a letter stating she was out of work for the month of Feb and the reason why (diagnosis.[place]). She cannot drop off paperwork because she cannot afford to pay 25 dollar fee. She is meeting with her social worker today at 12pm. She said she can reschedule that if needed. She is scheduled for LP tomorrow morning at [facility]. Told her I will call her back after speaking w/ Dr Jaynee Eagles. She understands.

## 2015-08-12 NOTE — Telephone Encounter (Signed)
Pt called and is asking that letter be from February till present day

## 2015-08-12 NOTE — Telephone Encounter (Signed)
Placed letter up front for pt pick up.

## 2015-08-12 NOTE — Telephone Encounter (Signed)
I have spoken with [facility] and advised letter will be ready for her to pick up this afternoon/fim

## 2015-08-13 ENCOUNTER — Other Ambulatory Visit: Payer: Self-pay | Admitting: Neurology

## 2015-08-13 ENCOUNTER — Ambulatory Visit
Admission: RE | Admit: 2015-08-13 | Discharge: 2015-08-13 | Disposition: A | Discharge status: Home / Self Care | Payer: Medicaid Other | Source: Ambulatory Visit | Attending: Neurology | Admitting: Neurology

## 2015-08-13 DIAGNOSIS — R9082 White matter disease, unspecified: Secondary | ICD-10-CM

## 2015-08-13 LAB — CSF CELL COUNT WITH DIFFERENTIAL
RBC Count, CSF: 0 cu mm
Tube #: 4
WBC CSF: 1 uL (ref 0–5)

## 2015-08-13 LAB — GLUCOSE, CSF: Glucose, CSF: 147 mg/dL — ABNORMAL HIGH (ref 43–76)

## 2015-08-13 LAB — PROTEIN, CSF: Total Protein, CSF: 58 mg/dL — ABNORMAL HIGH (ref 15–45)

## 2015-08-13 NOTE — Discharge Instructions (Signed)

## 2015-08-13 NOTE — Progress Notes (Addendum)
1 SST tube drawn from left AC to go with spinal fluid for testing. Discharge instructions explained to patient.  Brita Romp, RN

## 2015-08-15 ENCOUNTER — Telehealth: Payer: Self-pay | Admitting: Diagnostic Radiology

## 2015-08-15 NOTE — Progress Notes (Signed)
Expand All Collapse All   Radiology Telephone Encounter   Mrs Christina Orozco called with positional headache after LP on Friday. She has associated neck stiffness; no fever or neurologic deficit. Pathophysiology and treatment explained. She is going to start 48hrs of bedrest and keep staff at [facility] informed of her progress.   JWatts MD

## 2015-08-15 NOTE — Progress Notes (Signed)
Radiology Telephone Encounter  Christina Orozco called with positional headache after LP on Friday.  She has associated neck stiffness; no fever or neurologic deficit.  Pathophysiology and treatment explained.  She is going to start 48hrs of bedrest and keep staff at [facility] informed of her progress.    JWatts MD

## 2015-08-16 ENCOUNTER — Telehealth: Payer: Self-pay | Admitting: Radiology

## 2015-08-16 ENCOUNTER — Telehealth: Payer: Self-pay | Admitting: Neurology

## 2015-08-16 ENCOUNTER — Ambulatory Visit
Admission: RE | Admit: 2015-08-16 | Discharge: 2015-08-16 | Disposition: A | Discharge status: Home / Self Care | Payer: Medicaid Other | Source: Ambulatory Visit | Attending: Neurology | Admitting: Neurology

## 2015-08-16 DIAGNOSIS — G971 Other reaction to spinal and lumbar puncture: Secondary | ICD-10-CM

## 2015-08-16 LAB — CSF CULTURE W GRAM STAIN: Gram Stain: NONE SEEN

## 2015-08-16 LAB — CSF CULTURE: ORGANISM ID, BACTERIA: NO GROWTH

## 2015-08-16 MED ORDER — IOHEXOL 180 MG/ML  SOLN
1.0000 mL | Freq: Once | INTRAMUSCULAR | Status: AC | PRN
  Administered 2015-08-16: 1 mL via EPIDURAL

## 2015-08-16 NOTE — Telephone Encounter (Signed)
Called pt. She states her headache gets worse when she is standing/sitting. Her head is "pounding". She said it is relieved a little when she lays down. She stated it started after LP. I explained blood patch to pt and advised she will need someone to drive her to this appt. She said her brother can come and bring her. Advised I will call GI and see if they can fit her in.   Called and LVM for Caroline at GI to see if pt can be fit in for blood patch. Gave [facility] phone number.

## 2015-08-16 NOTE — Progress Notes (Signed)
20 cc's of blood drawn from left [facility] for blood patch. Site is unremarkable and pt tolerated procedure well.

## 2015-08-16 NOTE — Discharge Instructions (Signed)
Epidural Blood Patch Discharge Instructions ° °1. Go home and rest quietly for the next 24 hours.  It is important to lie flat for the next 24 hours.  Get up only to go to the restroom.  You may lie in the bed or on a couch on your back, your stomach, your left side or your right side.  You may have one pillow under your head.  You may have pillows between your knees while you are on your side or under your knees while you are on your back. ° °2. DO NOT drive today.  Recline the seat as far back as it will go, while still wearing your seat belt, on the way home. ° °3. You may get up to go to the bathroom as needed.  You may sit up for 10 minutes to eat.  You may resume your normal diet and medications unless otherwise indicated.  Drink plenty of extra fluids today and tomorrow.  Caffeine may help your body make spinal fluid faster, so add or increase your caffeine intake as much as possible.   ° °4. You may resume normal activities after your 24 hours of bed rest is over; however, do not exert yourself strongly or do any heavy lifting tomorrow. °

## 2015-08-16 NOTE — Telephone Encounter (Signed)
Pt called has had a headache all weekend post LP.  Explained to pt she needed to call Dr. Jaynee Eagles and follow her instructions as to what to do re: post LP headache.  If Dr. Jaynee Eagles wants her to have a blood patch we would be glad to schedule it.

## 2015-08-16 NOTE — Telephone Encounter (Signed)
Chrys Racer called back. Advised pt can come today at 1115am. Must have driver and no more food. I called pt and relayed message. She is going to have her brother bring her to appt.

## 2015-08-16 NOTE — Telephone Encounter (Signed)
Patient presented to the lobby stating that she has a major headache, stiff neck - cannot turn left or right. She wants to wait in the lobby for a few minutes. Her best number to call is  [phone]

## 2015-08-17 LAB — OLIGOCLONAL BANDS, CSF + SERM

## 2015-08-18 LAB — MULTIPLE SCLEROSIS PANEL 2
ALBUMIN MSPROF: 3840 mg/dL (ref 3700–5410)
ALBUMIN-INDEX: 8 (ref <9.0)
Albumin CSF: 31 mg/dL (ref <35)
IGA CSF: 0.48 mg/dL (ref 0.15–0.60)
IGG TOTAL CSF: 5.3 mg/dL (ref 0.5–6.1)
IGG TOTAL: 1301 mg/dL (ref 694–1618)
IGG-INDEX: 0.5 (ref <0.70)
IgA MSPROF: <0.001 mg/dL (ref <0.010)
IgA Total: 190 mg/dL (ref 81–463)
IgG: <0.01 mg/dL (ref <0.10)
IgM Total: 111 mg/dL (ref 48–271)
IgM-CSF: 0.09 mg/dL (ref <0.10)
Myelin basic protein, csf: <2 mcg/L (ref <4.1)

## 2015-08-20 ENCOUNTER — Telehealth: Payer: Self-pay | Admitting: *Deleted

## 2015-08-20 NOTE — Telephone Encounter (Signed)
-----   Message from Melvenia Beam, MD sent at 08/19/2015  5:02 PM EST ----- Results unlikely to be multiple sclerosis. We should just repeat an MRI of the brain in 9-12 months to follow the white matter changes. Thanks

## 2015-08-20 NOTE — Telephone Encounter (Signed)
LVM for pt to call about results. Gave [facility] phone number.  

## 2015-08-23 ENCOUNTER — Ambulatory Visit: Payer: Medicaid Other | Admitting: Family Medicine

## 2015-08-23 NOTE — Telephone Encounter (Signed)
Pt returned call. Please call when you have time. Thanks

## 2015-08-23 NOTE — Telephone Encounter (Signed)
Called and spoke to pt about results per Dr Jaynee Eagles note. Pt verbalized understanding.

## 2015-08-25 ENCOUNTER — Encounter: Payer: Self-pay | Admitting: Family Medicine

## 2015-08-25 ENCOUNTER — Telehealth: Payer: Self-pay | Admitting: *Deleted

## 2015-08-25 ENCOUNTER — Ambulatory Visit (INDEPENDENT_AMBULATORY_CARE_PROVIDER_SITE_OTHER): Payer: Medicaid Other | Admitting: Family Medicine

## 2015-08-25 ENCOUNTER — Other Ambulatory Visit (HOSPITAL_COMMUNITY)
Admission: RE | Admit: 2015-08-25 | Discharge: 2015-08-25 | Disposition: A | Discharge status: Home / Self Care | Payer: Medicaid Other | Source: Ambulatory Visit | Attending: Family Medicine | Admitting: Family Medicine

## 2015-08-25 VITALS — BP 158/92 | HR 93 | Temp 98.1°F | Resp 16 | Ht 63.0 in | Wt 166.0 lb

## 2015-08-25 DIAGNOSIS — N760 Acute vaginitis: Secondary | ICD-10-CM | POA: Diagnosis present

## 2015-08-25 DIAGNOSIS — I10 Essential (primary) hypertension: Secondary | ICD-10-CM

## 2015-08-25 DIAGNOSIS — Z113 Encounter for screening for infections with a predominantly sexual mode of transmission: Secondary | ICD-10-CM | POA: Insufficient documentation

## 2015-08-25 DIAGNOSIS — Z01419 Encounter for gynecological examination (general) (routine) without abnormal findings: Secondary | ICD-10-CM

## 2015-08-25 DIAGNOSIS — J309 Allergic rhinitis, unspecified: Secondary | ICD-10-CM | POA: Diagnosis not present

## 2015-08-25 DIAGNOSIS — E119 Type 2 diabetes mellitus without complications: Secondary | ICD-10-CM

## 2015-08-25 LAB — POCT URINALYSIS DIP (DEVICE)
BILIRUBIN URINE: NEGATIVE
Glucose, UA: 500 mg/dL — AB
KETONES UR: NEGATIVE mg/dL
Leukocytes, UA: NEGATIVE
Nitrite: NEGATIVE
PH: 5.5 (ref 5.0–8.0)
Protein, ur: 100 mg/dL — AB
Specific Gravity, Urine: 1.015 (ref 1.005–1.030)
Urobilinogen, UA: 0.2 mg/dL (ref 0.0–1.0)

## 2015-08-25 MED ORDER — TRUE METRIX AIR GLUCOSE METER DEVI: 1.0000 | Freq: Three times a day (TID) | Status: DC

## 2015-08-25 MED ORDER — OLOPATADINE HCL 0.2 % OP SOLN: 1.0000 [drp] | Freq: Every day | OPHTHALMIC | Status: DC

## 2015-08-25 MED ORDER — GLUCOSE BLOOD VI STRP: ORAL_STRIP | Status: DC

## 2015-08-25 NOTE — Progress Notes (Signed)
Subjective:    Patient ID: Christina Orozco, female    DOB: 02-19-1971, 45 y.o.   MRN: [id number]  HPI  [name], a 45 year old female with a history of type 2 diabetes mellitus and hypertension presents for a routine gynecological examination. She states that she has been taking all medications consistently.   Ms. Stamer maintains that she has not had a pap smear in greater than 3 years. Previous pap smear was normal per patient. She states that her last menstrual cycle was on 07/30/2015. She states that she is not sexually active at present. She has had 2 live births. She denies abnormal vaginal discharge, vaginal itching, dryness, dysuria, or burning.    Christina Orozco is here for evaluation of possible allergic rhinitis. Patient's symptoms include itchy eyes, swelling of eyes and watery eyes. These symptoms are seasonal. Current triggers include exposure to pollens and weather changes. The patient has been suffering from these symptoms for several days. The patient has not attempted any OTC interventions to assist with current symptoms.  The patient has not had sinus surgery in the past. Past Medical History  Diagnosis Date  . Diabetes mellitus   . Hypertension   . Asthma   . GERD (gastroesophageal reflux disease)   . Neuropathy ([place])   . DDD (degenerative disc disease), cervical   . Diabetes mellitus ([place])   . Hypertension    Past Surgical History  Procedure Laterality Date  . Tonsillectomy    . Appendectomy    . Adenoidectomy    . Cervical spine surgery  06/2012    C5-C6  . Shoulder surgery     Review of Systems  Constitutional: Negative.   HENT: Positive for postnasal drip.   Eyes: Positive for photophobia and visual disturbance.  Respiratory: Negative.   Cardiovascular: Negative.   Gastrointestinal: Negative.   Endocrine: Negative for polydipsia, polyphagia and polyuria.  Genitourinary: Negative.   Musculoskeletal: Negative.   Skin: Negative.    Allergic/Immunologic: Negative.   Neurological: Negative.  Negative for light-headedness.  Hematological: Negative.   Psychiatric/Behavioral: Negative.        Objective:   Physical Exam  HENT:  Head: Normocephalic and atraumatic.  Right Ear: External ear normal.  Left Ear: External ear normal.  Mouth/Throat: Oropharynx is clear and moist.  Eyes: Conjunctivae and EOM are normal. Pupils are equal, round, and reactive to light.  Clear drainage, no injection  Neck: Normal range of motion. Neck supple.  Genitourinary: There is no tenderness on the right labia. There is no tenderness on the left labia. Cervix exhibits discharge. Right adnexum displays no mass and no tenderness. Left adnexum displays no mass and no tenderness. Vaginal discharge found.  Neurological: No cranial nerve deficit or sensory deficit.  Skin: Skin is warm and dry.  Psychiatric: She has a normal mood and affect. Her speech is normal and behavior is normal. Judgment and thought content normal. Cognition and memory are normal.       BP 158/92 mmHg  Pulse 93  Temp(Src) 98.1 F (36.7 C) (Oral)  Resp 16  Ht 5\' 3"  (1.6 m)  Wt 166 lb (75.297 kg)  BMI 29.41 kg/m2  LMP 07/30/2015 Assessment & Plan:  1.Encounter for routine gynecological examination Will notify patient with laboratory results.  - Cytology - PAP [place]  2. Essential hypertension Blood pressure is above goal on current medication regimen, but has improved since previous visit. She was advised to continue medications at current dosages. The patient is  asked to make an attempt to improve diet and exercise patterns to aid in medical management of this problem.  3. Type 2 diabetes mellitus without complication, without long-term current use of insulin (HCC) Continue medications as previously prescribed.  - Ambulatory referral to Ophthalmology  4.  Allergic rhinitis, unspecified allergic rhinitis type  - Olopatadine HCl (PATADAY) 0.2 % SOLN; Apply  1 drop to eye daily.  Dispense: 1 Bottle; Refill: 0  Routine Health Maintenance:  Recommend monthly self-breast exams Reviewed previous mammogram, discussed dense breast tissue at length Recommend a lowfat, low carbohydrate diet divided over 5-6 small meals, increase water intake to 6-8 glasses, and 150 minutes per week of cardiovascular exercise.    The patient was given clear instructions to go to ER or return to medical center if symptoms do not improve, worsen or new problems develop. The patient verbalized understanding. Will notify patient with laboratory results. Dorena Dew, FNP

## 2015-08-25 NOTE — Telephone Encounter (Signed)
Refill for meter and strips sent into pharmacy. Thanks!

## 2015-08-25 NOTE — Patient Instructions (Addendum)
Referral sent to opthalmology Will scheduled a yearly digital mammogram yearly   Allergic Conjunctivitis Allergic conjunctivitis is inflammation of the clear membrane that covers the white part of your eye and the inner surface of your eyelid (conjunctiva), and it is caused by allergies. The blood vessels in the conjunctiva become inflamed, and this causes the eye to become red or pink, and it often causes itchiness in the eye. Allergic conjunctivitis cannot be spread by one person to another person (noncontagious). CAUSES This condition is caused by an allergic reaction. Common causes of an allergic reaction (allergens) include:  Dust.  Pollen.  Mold.  Animal dander or secretions. RISK FACTORS This condition is more likely to develop if you are exposed to high levels of allergens that cause the allergic reaction. This might include being outdoors when air pollen levels are high or being around animals that you are allergic to. SYMPTOMS Symptoms of this condition may include:  Eye redness.  Tearing of the eyes.  Watery eyes.  Itchy eyes.  Burning feeling in the eyes.  Clear drainage from the eyes.  Swollen eyelids. DIAGNOSIS This condition may be diagnosed by medical history and physical exam. If you have drainage from your eyes, it may be tested to rule out other causes of conjunctivitis. TREATMENT Treatment for this condition often includes medicines. These may be eye drops, ointments, or oral medicines. They may be prescription medicines or over-the-counter medicines. HOME CARE INSTRUCTIONS  Take or apply medicines only as directed by your health care provider.  Do not touch or rub your eyes.  Do not wear contact lenses until the inflammation is gone. Wear glasses instead.  Do not wear eye makeup until the inflammation is gone.  Apply a cool, clean washcloth to your eye for 10-20 minutes, 3-4 times a day.  Try to avoid whatever allergen is causing the allergic  reaction. SEEK MEDICAL CARE IF:  Your symptoms get worse.  You have pus draining from your eye.  You have new symptoms.  You have a fever.   This information is not intended to replace advice given to you by your health care provider. Make sure you discuss any questions you have with your health care provider.   Document Released: 08/19/2002 Document Revised: 06/19/2014 Document Reviewed: 03/10/2014 Elsevier Interactive Patient Education 2016 [employer]. Allergic Rhinitis Allergic rhinitis is when the mucous membranes in the nose respond to allergens. Allergens are particles in the air that cause your body to have an allergic reaction. This causes you to release allergic antibodies. Through a chain of events, these eventually cause you to release histamine into the blood stream. Although meant to protect the body, it is this release of histamine that causes your discomfort, such as frequent sneezing, congestion, and an itchy, runny nose.  CAUSES Seasonal allergic rhinitis (hay fever) is caused by pollen allergens that may come from grasses, trees, and weeds. Year-round allergic rhinitis (perennial allergic rhinitis) is caused by allergens such as house dust mites, pet dander, and mold spores. SYMPTOMS  Nasal stuffiness (congestion).  Itchy, runny nose with sneezing and tearing of the eyes. DIAGNOSIS Your health care provider can help you determine the allergen or allergens that trigger your symptoms. If you and your health care provider are unable to determine the allergen, skin or blood testing may be used. Your health care provider will diagnose your condition after taking your health history and performing a physical exam. Your health care provider may assess you for other related conditions, such  as asthma, pink eye, or an ear infection. TREATMENT Allergic rhinitis does not have a cure, but it can be controlled by:  Medicines that block allergy symptoms. These may include allergy  shots, nasal sprays, and oral antihistamines.  Avoiding the allergen. Hay fever may often be treated with antihistamines in pill or nasal spray forms. Antihistamines block the effects of histamine. There are over-the-counter medicines that may help with nasal congestion and swelling around the eyes. Check with your health care provider before taking or giving this medicine. If avoiding the allergen or the medicine prescribed do not work, there are many new medicines your health care provider can prescribe. Stronger medicine may be used if initial measures are ineffective. Desensitizing injections can be used if medicine and avoidance does not work. Desensitization is when a patient is given ongoing shots until the body becomes less sensitive to the allergen. Make sure you follow up with your health care provider if problems continue. HOME CARE INSTRUCTIONS It is not possible to completely avoid allergens, but you can reduce your symptoms by taking steps to limit your exposure to them. It helps to know exactly what you are allergic to so that you can avoid your specific triggers. SEEK MEDICAL CARE IF:  You have a fever.  You develop a cough that does not stop easily (persistent).  You have shortness of breath.  You start wheezing.  Symptoms interfere with normal daily activities.   This information is not intended to replace advice given to you by your health care provider. Make sure you discuss any questions you have with your health care provider.   Document Released: 02/21/2001 Document Revised: 06/19/2014 Document Reviewed: 02/03/2013 Elsevier Interactive Patient Education [employer].

## 2015-08-25 NOTE — Telephone Encounter (Signed)
Pt called and stated she needs a new RX for testing strips for her glucose meter. Her pharmacy is [facility] on [address]. Please advise provider. Thanks

## 2015-08-26 LAB — CYTOLOGY - PAP

## 2015-08-27 ENCOUNTER — Telehealth: Payer: Self-pay

## 2015-08-27 NOTE — Telephone Encounter (Signed)
-----   Message from Dorena Dew, [place] sent at 08/26/2015  4:11 PM EDT ----- Regarding: pap smear results Please inform Christina Orozco that pap smear is negative. Will need to repeat in 3 years according to [place] guidelines. Please follow up in office as scheduled.   Thanks ----- Message -----    From: [facility] [place] [facility]    Sent: 08/26/2015   3:57 PM      To: Dorena Dew, FNP

## 2015-08-27 NOTE — Telephone Encounter (Signed)
Called and advised of normal pap smear and to repeat in 3 years. Patient had no future questions today. Thanks!

## 2015-08-30 LAB — CERVICOVAGINAL ANCILLARY ONLY
Bacterial vaginitis: NEGATIVE
CANDIDA VAGINITIS: NEGATIVE

## 2015-09-22 ENCOUNTER — Ambulatory Visit (HOSPITAL_COMMUNITY)
Admission: EM | Admit: 2015-09-22 | Discharge: 2015-09-22 | Disposition: A | Discharge status: Home / Self Care | Payer: Medicaid Other | Attending: Family Medicine | Admitting: Family Medicine

## 2015-09-22 ENCOUNTER — Encounter (HOSPITAL_COMMUNITY): Payer: Self-pay | Admitting: Emergency Medicine

## 2015-09-22 DIAGNOSIS — B029 Zoster without complications: Secondary | ICD-10-CM

## 2015-09-22 LAB — HM DIABETES EYE EXAM

## 2015-09-22 MED ORDER — OXYCODONE-ACETAMINOPHEN 10-325 MG PO TABS: 1.0000 | ORAL_TABLET | ORAL | Status: DC | PRN

## 2015-09-22 MED ORDER — VALACYCLOVIR HCL 1 G PO TABS: 1000.0000 mg | ORAL_TABLET | Freq: Three times a day (TID) | ORAL | Status: DC

## 2015-09-22 NOTE — ED Notes (Signed)
The patient presented to the [facility] with a complaint of a painful rash only on the right side of her trunk that has been there since 4 days.

## 2015-09-22 NOTE — Discharge Instructions (Signed)
Shingles Shingles is an infection that causes a painful skin rash and fluid-filled blisters. Shingles is caused by the same virus that causes chickenpox. Shingles only develops in people who:  Have had chickenpox.  Have gotten the chickenpox vaccine. (This is rare.) The first symptoms of shingles may be itching, tingling, or pain in an area on your skin. A rash will follow in a few days or weeks. The rash is usually on one side of the body in a bandlike or beltlike pattern. Over time, the rash turns into fluid-filled blisters that break open, scab over, and dry up. Medicines may:  Help you manage pain.  Help you recover more quickly.  Help to prevent long-term problems. HOME CARE Medicines  Take medicines only as told by your doctor.  Apply an anti-itch or numbing cream to the affected area as told by your doctor. Blister and Rash Care  Take a cool bath or put cool compresses on the area of the rash or blisters as told by your doctor. This may help with pain and itching.  Keep your rash covered with a loose bandage (dressing). Wear loose-fitting clothing.  Keep your rash and blisters clean with mild soap and cool water or as told by your doctor.  Check your rash every day for signs of infection. These include redness, swelling, and pain that lasts or gets worse.  Do not pick your blisters.  Do not scratch your rash. General Instructions  Rest as told by your doctor.  Keep all follow-up visits as told by your doctor. This is important.  Until your blisters scab over, your infection can cause chickenpox in people who have never had it or been vaccinated against it. To prevent this from happening, avoid touching other people or being around other people, especially:  Babies.  Pregnant women.  Children who have eczema.  Elderly people who have transplants.  People who have chronic illnesses, such as leukemia or AIDS. GET HELP IF:  Your pain does not get better with  medicine.  Your pain does not get better after the rash heals.  Your rash looks infected. Signs of infection include:  Redness.  Swelling.  Pain that lasts or gets worse. GET HELP RIGHT AWAY IF:  The rash is on your face or nose.  You have pain in your face, pain around your eye area, or loss of feeling on one side of your face.  You have ear pain or you have ringing in your ear.  You have loss of taste.  Your condition gets worse.   This information is not intended to replace advice given to you by your health care provider. Make sure you discuss any questions you have with your health care provider.   Document Released: 11/15/2007 Document Revised: 06/19/2014 Document Reviewed: 03/10/2014 Elsevier Interactive Patient Education 2016 Elsevier Inc.  Neuropathic Pain Neuropathic pain is pain caused by damage to the nerves that are responsible for certain sensations in your body (sensory nerves). The pain can be caused by damage to:   The sensory nerves that send signals to your spinal cord and brain (peripheral nervous system).  The sensory nerves in your brain or spinal cord (central nervous system). Neuropathic pain can make you more sensitive to pain. What would be a minor sensation for most people may feel very painful if you have neuropathic pain. This is usually a long-term condition that can be difficult to treat. The type of pain can differ from person to person. It may start  suddenly (acute), or it may develop slowly and last for a long time (chronic). Neuropathic pain may come and go as damaged nerves heal or may stay at the same level for years. It often causes emotional distress, loss of sleep, and a lower quality of life. CAUSES  The most common cause of damage to a sensory nerve is diabetes. Many other diseases and conditions can also cause neuropathic pain. Causes of neuropathic pain can be classified as:  Toxic. Many drugs and chemicals can cause toxic damage. The  most common cause of toxic neuropathic pain is damage from drug treatment for cancer (chemotherapy).  Metabolic. This type of pain can happen when a disease causes imbalances that damage nerves. Diabetes is the most common of these diseases. Vitamin B deficiency caused by long-term alcohol abuse is another common cause.  Traumatic. Any injury that cuts, crushes, or stretches a nerve can cause damage and pain. A common example is feeling pain after losing an arm or leg (phantom limb pain).  Compression-related. If a sensory nerve gets trapped or compressed for a long period of time, the blood supply to the nerve can be cut off.  Vascular. Many blood vessel diseases can cause neuropathic pain by decreasing blood supply and oxygen to nerves.  Autoimmune. This type of pain results from diseases in which the body's defense system mistakenly attacks sensory nerves. Examples of autoimmune diseases that can cause neuropathic pain include lupus and multiple sclerosis.  Infectious. Many types of viral infections can damage sensory nerves and cause pain. Shingles infection is a common cause of this type of pain.  Inherited. Neuropathic pain can be a symptom of many diseases that are passed down through families (genetic). SIGNS AND SYMPTOMS  The main symptom is pain. Neuropathic pain is often described as:  Burning.  Shock-like.  Stinging.  Hot or cold.  Itching. DIAGNOSIS  No single test can diagnose neuropathic pain. Your health care provider will do a physical exam and ask you about your pain. You may use a pain scale to describe how bad your pain is. You may also have tests to see if you have a high sensitivity to pain and to help find the cause and location of any sensory nerve damage. These tests may include:  Imaging studies, such as:  X-rays.  CT scan.  MRI.  Nerve conduction studies to test how well nerve signals travel through your sensory nerves (electrodiagnostic  testing).  Stimulating your sensory nerves through electrodes on your skin and measuring the response in your spinal cord and brain (somatosensory evoked potentials). TREATMENT  Treatment for neuropathic pain may change over time. You may need to try different treatment options or a combination of treatments. Some options include:  Over-the-counter pain relievers.  Prescription medicines. Some medicines used to treat other conditions may also help neuropathic pain. These include medicines to:  Control seizures (anticonvulsants).  Relieve depression (antidepressants).  Prescription-strength pain relievers (narcotics). These are usually used when other pain relievers do not help.  Transcutaneous nerve stimulation (TENS). This uses electrical currents to block painful nerve signals. The treatment is painless.  Topical and local anesthetics. These are medicines that numb the nerves. They can be injected as a nerve block or applied to the skin.  Alternative treatments, such as:  Acupuncture.  Meditation.  Massage.  Physical therapy.  Pain management programs.  Counseling. HOME CARE INSTRUCTIONS  Learn as much as you can about your condition.  Take medicines only as directed by your health  care provider.  Work closely with all your health care providers to find what works best for you.  Have a good support system at home.  Consider joining a chronic pain support group. SEEK MEDICAL CARE IF:  Your pain treatments are not helping.  You are having side effects from your medicines.  You are struggling with fatigue, mood changes, depression, or anxiety.   This information is not intended to replace advice given to you by your health care provider. Make sure you discuss any questions you have with your health care provider.   Document Released: 02/24/2004 Document Revised: 06/19/2014 Document Reviewed: 11/06/2013 Elsevier Interactive Patient Education [employer].

## 2015-09-23 NOTE — ED Provider Notes (Signed)
CSN: [id number]     Arrival date & time 09/22/15  1649 History   First MD Initiated Contact with Patient 09/22/15 1758     Chief Complaint  Patient presents with  . Rash   (Consider location/radiation/quality/duration/timing/severity/associated sxs/prior Treatment) HPI History obtained from patient: Location:back Context/Durationsudden onset of painful rash 2 days Severity:6  Quality:buring pain Timing:     constant       Home Treatment: none Associated symptoms:  pain   Past Medical History  Diagnosis Date  . Diabetes mellitus   . Hypertension   . Asthma   . GERD (gastroesophageal reflux disease)   . Neuropathy ([place])   . DDD (degenerative disc disease), cervical   . Diabetes mellitus ([place])   . Hypertension    Past Surgical History  Procedure Laterality Date  . Tonsillectomy    . Appendectomy    . Adenoidectomy    . Cervical spine surgery  06/2012    C5-C6  . Shoulder surgery     Family History  Problem Relation Age of Onset  . Heart disease    . Cancer      colon, stomach, lung  . Diabetes    . Seizures     Social History  Substance Use Topics  . Smoking status: Never Smoker   . Smokeless tobacco: Never Used  . Alcohol Use: No   OB History    No data available     Review of Systems Painful rash Allergies  Aspirin and Penicillins  Home Medications   Prior to Admission medications   Medication Sig Start Date End Date Taking? Authorizing Provider  amLODipine (NORVASC) 10 MG tablet Take 1 tablet (10 mg total) by mouth daily. 06/08/15  Yes Dorena Dew, FNP  Blood Glucose Monitoring Suppl (TRUE METRIX AIR GLUCOSE METER) DEVI 1 each by Does not apply route 4 (four) times daily -  before meals and at bedtime. 08/25/15  Yes Dorena Dew, FNP  gabapentin (NEURONTIN) 300 MG capsule Take 2 capsules (600 mg total) by mouth 3 (three) times daily. 07/05/15  Yes Melvenia Beam, MD  glucose blood (TRUE METRIX BLOOD GLUCOSE TEST) test strip Use as instructed  08/25/15  Yes Dorena Dew, FNP  HYDROcodone-acetaminophen [facility]) 7.5-325 MG tablet Reported on 08/25/2015 08/05/15  Yes Historical Provider, MD  insulin aspart (NOVOLOG) 100 UNIT/ML FlexPen Inject 10 Units into the skin 3 (three) times daily with meals. 07/08/15  Yes Dorena Dew, FNP  insulin detemir (LEVEMIR) 100 UNIT/ML injection Inject 0.15 mLs (15 Units total) into the skin 2 (two) times daily. 07/06/15  Yes Dorena Dew, FNP  Insulin Pen Needle 31G X 6 MM MISC 1 each by Does not apply route 4 (four) times daily - after meals and at bedtime. 07/08/15  Yes Dorena Dew, FNP  Lancet Device MISC 1 each by Does not apply route 4 (four) times daily -  before meals and at bedtime. 05/05/15  Yes Dorena Dew, FNP  lisinopril (PRINIVIL,ZESTRIL) 20 MG tablet TAKE 1 TABLET(20 MG) BY MOUTH DAILY 07/13/15  Yes Dorena Dew, FNP  omeprazole (PRILOSEC) 40 MG capsule Take 1 capsule (40 mg total) by mouth daily. Reported on 06/08/2015 06/08/15  Yes Dorena Dew, FNP  albuterol (PROVENTIL HFA;VENTOLIN HFA) 108 (90 Base) MCG/ACT inhaler Inhale 1-2 puffs into the lungs every 6 (six) hours as needed for wheezing or shortness of breath. 07/13/15   Dorena Dew, FNP  ALPRAZolam Duanne Moron) 0.5 MG tablet Take 1-2  before lumbar puncture. May repeat if needed. Do not drive. Patient not taking: Reported on 08/25/2015 07/28/15   Melvenia Beam, MD  ergocalciferol (VITAMIN D2) 50000 UNITS capsule Take 1 capsule (50,000 Units total) by mouth once a week. Patient not taking: Reported on 08/25/2015 05/07/15   Dorena Dew, FNP  meclizine (ANTIVERT) 25 MG tablet Take 1 tablet (25 mg total) by mouth 3 (three) times daily as needed for dizziness. 07/23/15   Micheline Chapman, NP  Olopatadine HCl (PATADAY) 0.2 % SOLN Apply 1 drop to eye daily. 08/25/15   Dorena Dew, FNP  oxyCODONE-acetaminophen (PERCOCET) 10-325 MG tablet Take 1 tablet by mouth every 8 (eight) hours as needed for pain. Reported on  08/25/2015    Historical Provider, MD  oxyCODONE-acetaminophen (PERCOCET) 10-325 MG tablet Take 1 tablet by mouth every 4 (four) hours as needed for pain. 09/22/15   Konrad Felix, PA  tiZANidine (ZANAFLEX) 4 MG tablet Take 4 mg by mouth every 6 (six) hours as needed for muscle spasms. Reported on 08/25/2015    Historical Provider, MD  traMADol (ULTRAM) 50 MG tablet Take 1 tablet (50 mg total) by mouth every 6 (six) hours as needed. Patient not taking: Reported on 07/13/2015 07/05/15   Melvenia Beam, MD  valACYclovir (VALTREX) 1000 MG tablet Take 1 tablet (1,000 mg total) by mouth 3 (three) times daily. 09/22/15   Konrad Felix, PA   Meds Ordered and Administered this Visit  Medications - No data to display  BP 197/95 mmHg  Pulse 83  Temp(Src) 98 F (36.7 C) (Oral)  Resp 18  SpO2 100%  LMP 08/30/2015 (Exact Date) No data found.   Physical Exam  Constitutional: She appears well-developed and well-nourished.  HENT:  Head: Normocephalic and atraumatic.  Pulmonary/Chest: Effort normal.  Abdominal: Soft.  Musculoskeletal:       Arms: Skin: Skin is warm and dry. Rash noted.  Psychiatric: She has a normal mood and affect.  Nursing note and vitals reviewed.   ED Course  Procedures (including critical care time)  Labs Review Labs Reviewed - No data to display  Imaging Review No results found.   Visual Acuity Review  Right Eye Distance:   Left Eye Distance:   Bilateral Distance:    Right Eye Near:   Left Eye Near:    Bilateral Near:       RX: valtrex  MDM   1. Zoster     Patient is reassured that there are no issues that require transfer to higher level of care at this time or additional tests. Patient is advised to continue home symptomatic treatment. Patient is advised that if there are new or worsening symptoms to attend the emergency department, contact primary care provider, or return to UC. Instructions of care provided discharged home in stable  condition.    THIS NOTE WAS GENERATED USING A VOICE RECOGNITION SOFTWARE PROGRAM. ALL REASONABLE EFFORTS  WERE MADE TO PROOFREAD THIS DOCUMENT FOR ACCURACY.  I have verbally reviewed the discharge instructions with the patient. A printed AVS was given to the patient.  All questions were answered prior to discharge.      Konrad Felix, [place] 09/23/15 1048

## 2015-09-28 ENCOUNTER — Encounter (HOSPITAL_COMMUNITY): Payer: Self-pay | Admitting: Emergency Medicine

## 2015-09-28 ENCOUNTER — Emergency Department (HOSPITAL_COMMUNITY): Payer: Medicaid Other

## 2015-09-28 ENCOUNTER — Emergency Department (HOSPITAL_COMMUNITY)
Admission: EM | Admit: 2015-09-28 | Discharge: 2015-09-28 | Disposition: A | Discharge status: Home / Self Care | Payer: Medicaid Other | Attending: Emergency Medicine | Admitting: Emergency Medicine

## 2015-09-28 DIAGNOSIS — G629 Polyneuropathy, unspecified: Secondary | ICD-10-CM | POA: Insufficient documentation

## 2015-09-28 DIAGNOSIS — Z794 Long term (current) use of insulin: Secondary | ICD-10-CM | POA: Insufficient documentation

## 2015-09-28 DIAGNOSIS — R21 Rash and other nonspecific skin eruption: Secondary | ICD-10-CM | POA: Diagnosis present

## 2015-09-28 DIAGNOSIS — B029 Zoster without complications: Secondary | ICD-10-CM

## 2015-09-28 DIAGNOSIS — Z880 Allergy status to penicillin: Secondary | ICD-10-CM | POA: Insufficient documentation

## 2015-09-28 DIAGNOSIS — I10 Essential (primary) hypertension: Secondary | ICD-10-CM | POA: Insufficient documentation

## 2015-09-28 DIAGNOSIS — Z79899 Other long term (current) drug therapy: Secondary | ICD-10-CM | POA: Insufficient documentation

## 2015-09-28 DIAGNOSIS — Z3202 Encounter for pregnancy test, result negative: Secondary | ICD-10-CM | POA: Insufficient documentation

## 2015-09-28 DIAGNOSIS — J45909 Unspecified asthma, uncomplicated: Secondary | ICD-10-CM | POA: Diagnosis not present

## 2015-09-28 DIAGNOSIS — E119 Type 2 diabetes mellitus without complications: Secondary | ICD-10-CM | POA: Diagnosis not present

## 2015-09-28 DIAGNOSIS — K219 Gastro-esophageal reflux disease without esophagitis: Secondary | ICD-10-CM | POA: Insufficient documentation

## 2015-09-28 DIAGNOSIS — Z8739 Personal history of other diseases of the musculoskeletal system and connective tissue: Secondary | ICD-10-CM | POA: Insufficient documentation

## 2015-09-28 LAB — URINALYSIS, ROUTINE W REFLEX MICROSCOPIC
BILIRUBIN URINE: NEGATIVE
Glucose, UA: >1000 mg/dL — AB
KETONES UR: 15 mg/dL — AB
LEUKOCYTES UA: NEGATIVE
NITRITE: NEGATIVE
PH: 5 (ref 5.0–8.0)
Protein, ur: 100 mg/dL — AB
SPECIFIC GRAVITY, URINE: 1.035 — AB (ref 1.005–1.030)

## 2015-09-28 LAB — I-STAT BETA HCG BLOOD, ED (MC, WL, AP ONLY)

## 2015-09-28 LAB — CBC WITH DIFFERENTIAL/PLATELET
Basophils Absolute: 0 10*3/uL (ref 0.0–0.1)
Basophils Relative: 1 %
EOS ABS: 0.1 10*3/uL (ref 0.0–0.7)
EOS PCT: 1 %
HCT: 34.6 % — ABNORMAL LOW (ref 36.0–46.0)
Hemoglobin: 11.8 g/dL — ABNORMAL LOW (ref 12.0–15.0)
LYMPHS ABS: 1.9 10*3/uL (ref 0.7–4.0)
Lymphocytes Relative: 29 %
MCH: 28.6 pg (ref 26.0–34.0)
MCHC: 34.1 g/dL (ref 30.0–36.0)
MCV: 83.8 fL (ref 78.0–100.0)
MONO ABS: 0.2 10*3/uL (ref 0.1–1.0)
MONOS PCT: 3 %
Neutro Abs: 4.3 10*3/uL (ref 1.7–7.7)
Neutrophils Relative %: 66 %
PLATELETS: 294 10*3/uL (ref 150–400)
RBC: 4.13 MIL/uL (ref 3.87–5.11)
RDW: 12.9 % (ref 11.5–15.5)
WBC: 6.5 10*3/uL (ref 4.0–10.5)

## 2015-09-28 LAB — COMPREHENSIVE METABOLIC PANEL
ALT: 9 U/L — ABNORMAL LOW (ref 14–54)
ANION GAP: 10 (ref 5–15)
AST: 10 U/L — ABNORMAL LOW (ref 15–41)
Albumin: 3.6 g/dL (ref 3.5–5.0)
Alkaline Phosphatase: 54 U/L (ref 38–126)
BUN: 14 mg/dL (ref 6–20)
CHLORIDE: 105 mmol/L (ref 101–111)
CO2: 21 mmol/L — AB (ref 22–32)
Calcium: 9.2 mg/dL (ref 8.9–10.3)
Creatinine, Ser: 1.2 mg/dL — ABNORMAL HIGH (ref 0.44–1.00)
GFR, EST NON AFRICAN AMERICAN: 55 mL/min — AB (ref >60)
Glucose, Bld: 322 mg/dL — ABNORMAL HIGH (ref 65–99)
POTASSIUM: 3.9 mmol/L (ref 3.5–5.1)
SODIUM: 136 mmol/L (ref 135–145)
Total Bilirubin: 0.6 mg/dL (ref 0.3–1.2)
Total Protein: 7.4 g/dL (ref 6.5–8.1)

## 2015-09-28 LAB — URINE MICROSCOPIC-ADD ON

## 2015-09-28 MED ORDER — HYDROMORPHONE HCL 1 MG/ML IJ SOLN
1.0000 mg | Freq: Once | INTRAMUSCULAR | Status: AC
  Administered 2015-09-28: 1 mg via INTRAVENOUS
  Filled 2015-09-28: qty 1

## 2015-09-28 MED ORDER — SODIUM CHLORIDE 0.9 % IV BOLUS (SEPSIS)
1000.0000 mL | Freq: Once | INTRAVENOUS | Status: AC
  Administered 2015-09-28: 1000 mL via INTRAVENOUS

## 2015-09-28 MED ORDER — KETOROLAC TROMETHAMINE 30 MG/ML IJ SOLN
30.0000 mg | Freq: Once | INTRAMUSCULAR | Status: AC
  Administered 2015-09-28: 30 mg via INTRAVENOUS
  Filled 2015-09-28: qty 1

## 2015-09-28 MED ORDER — ONDANSETRON HCL 4 MG/2ML IJ SOLN
4.0000 mg | Freq: Once | INTRAMUSCULAR | Status: AC
  Administered 2015-09-28: 4 mg via INTRAVENOUS
  Filled 2015-09-28: qty 2

## 2015-09-28 MED ORDER — HYDROMORPHONE HCL 4 MG PO TABS: 4.0000 mg | ORAL_TABLET | Freq: Four times a day (QID) | ORAL | Status: DC | PRN

## 2015-09-28 NOTE — ED Notes (Signed)
Pt sts diagnosed with shingles last week with rash at left flank area; pt sts increasing pain

## 2015-09-28 NOTE — ED Provider Notes (Signed)
CSN: [id number]     Arrival date & time 09/28/15  0831 History   First MD Initiated Contact with Patient 09/28/15 0920     Chief Complaint  Patient presents with  . Flank Pain  . Rash     (Consider location/radiation/quality/duration/timing/severity/associated sxs/prior Treatment) Patient is a 45 y.o. female presenting with flank pain and rash. The history is provided by the patient (Patient complains of right flank pain. Patient is being  treated for shingles on the right flank. The rash is improving the pain is getting worse).  Flank Pain This is a new problem. The current episode started more than 2 days ago. The problem occurs constantly. The problem has not changed since onset.Pertinent negatives include no chest pain, no abdominal pain and no headaches. Nothing aggravates the symptoms. Nothing relieves the symptoms.  Rash Associated symptoms: no abdominal pain, no diarrhea, no fatigue and no headaches     Past Medical History  Diagnosis Date  . Diabetes mellitus   . Hypertension   . Asthma   . GERD (gastroesophageal reflux disease)   . Neuropathy ([place])   . DDD (degenerative disc disease), cervical   . Diabetes mellitus ([place])   . Hypertension    Past Surgical History  Procedure Laterality Date  . Tonsillectomy    . Appendectomy    . Adenoidectomy    . Cervical spine surgery  06/2012    C5-C6  . Shoulder surgery     Family History  Problem Relation Age of Onset  . Heart disease    . Cancer      colon, stomach, lung  . Diabetes    . Seizures     Social History  Substance Use Topics  . Smoking status: Never Smoker   . Smokeless tobacco: Never Used  . Alcohol Use: No   OB History    No data available     Review of Systems  Constitutional: Negative for appetite change and fatigue.  HENT: Negative for congestion, ear discharge and sinus pressure.   Eyes: Negative for discharge.  Respiratory: Negative for cough.   Cardiovascular: Negative for chest pain.   Gastrointestinal: Negative for abdominal pain and diarrhea.  Genitourinary: Positive for flank pain. Negative for frequency and hematuria.  Musculoskeletal: Negative for back pain.  Skin: Positive for rash.  Neurological: Negative for seizures and headaches.  Psychiatric/Behavioral: Negative for hallucinations.      Allergies  Aspirin and Penicillins  Home Medications   Prior to Admission medications   Medication Sig Start Date End Date Taking? Authorizing Provider  amLODipine (NORVASC) 10 MG tablet Take 1 tablet (10 mg total) by mouth daily. 06/08/15  Yes Dorena Dew, FNP  Blood Glucose Monitoring Suppl (TRUE METRIX AIR GLUCOSE METER) DEVI 1 each by Does not apply route 4 (four) times daily -  before meals and at bedtime. 08/25/15  Yes Dorena Dew, FNP  gabapentin (NEURONTIN) 300 MG capsule Take 2 capsules (600 mg total) by mouth 3 (three) times daily. 07/05/15  Yes Melvenia Beam, MD  glucose blood (TRUE METRIX BLOOD GLUCOSE TEST) test strip Use as instructed 08/25/15  Yes Dorena Dew, FNP  insulin aspart (NOVOLOG) 100 UNIT/ML FlexPen Inject 10 Units into the skin 3 (three) times daily with meals. 07/08/15  Yes Dorena Dew, FNP  insulin detemir (LEVEMIR) 100 UNIT/ML injection Inject 0.15 mLs (15 Units total) into the skin 2 (two) times daily. 07/06/15  Yes Dorena Dew, FNP  Insulin Pen Needle 31G X  6 MM MISC 1 each by Does not apply route 4 (four) times daily - after meals and at bedtime. 07/08/15  Yes Dorena Dew, FNP  Lancet Device MISC 1 each by Does not apply route 4 (four) times daily -  before meals and at bedtime. 05/05/15  Yes Dorena Dew, FNP  lisinopril (PRINIVIL,ZESTRIL) 20 MG tablet TAKE 1 TABLET(20 MG) BY MOUTH DAILY 07/13/15  Yes Dorena Dew, FNP  Olopatadine HCl (PATADAY) 0.2 % SOLN Apply 1 drop to eye daily. 08/25/15  Yes Dorena Dew, FNP  omeprazole (PRILOSEC) 40 MG capsule Take 1 capsule (40 mg total) by mouth daily. Reported on  06/08/2015 06/08/15  Yes Dorena Dew, FNP  oxyCODONE-acetaminophen (PERCOCET) 10-325 MG tablet Take 1 tablet by mouth every 4 (four) hours as needed for pain. 09/22/15  Yes Konrad Felix, PA  valACYclovir (VALTREX) 1000 MG tablet Take 1 tablet (1,000 mg total) by mouth 3 (three) times daily. 09/22/15  Yes Konrad Felix, PA  albuterol (PROVENTIL HFA;VENTOLIN HFA) 108 (90 Base) MCG/ACT inhaler Inhale 1-2 puffs into the lungs every 6 (six) hours as needed for wheezing or shortness of breath. 07/13/15   Dorena Dew, FNP  ALPRAZolam Duanne Moron) 0.5 MG tablet Take 1-2 before lumbar puncture. May repeat if needed. Do not drive. Patient not taking: Reported on 08/25/2015 07/28/15   Melvenia Beam, MD  ergocalciferol (VITAMIN D2) 50000 UNITS capsule Take 1 capsule (50,000 Units total) by mouth once a week. Patient not taking: Reported on 08/25/2015 05/07/15   Dorena Dew, FNP  HYDROmorphone (DILAUDID) 4 MG tablet Take 1 tablet (4 mg total) by mouth every 6 (six) hours as needed for severe pain. 09/28/15   Milton Ferguson, MD  meclizine (ANTIVERT) 25 MG tablet Take 1 tablet (25 mg total) by mouth 3 (three) times daily as needed for dizziness. Patient not taking: Reported on 09/28/2015 07/23/15   Micheline Chapman, NP  traMADol (ULTRAM) 50 MG tablet Take 1 tablet (50 mg total) by mouth every 6 (six) hours as needed. Patient not taking: Reported on 07/13/2015 07/05/15   Melvenia Beam, MD   BP 152/89 mmHg  Pulse 80  Temp(Src) 98.2 F (36.8 C) (Oral)  Resp 20  SpO2 100%  LMP 08/30/2015 (Exact Date) Physical Exam  Constitutional: She is oriented to person, place, and time. She appears well-developed.  HENT:  Head: Normocephalic.  Eyes: Conjunctivae and EOM are normal. No scleral icterus.  Neck: Neck supple. No thyromegaly present.  Cardiovascular: Normal rate and regular rhythm.  Exam reveals no gallop and no friction rub.   No murmur heard. Pulmonary/Chest: No stridor. She has no wheezes. She has  no rales. She exhibits no tenderness.  Abdominal: She exhibits no distension. There is no tenderness. There is no rebound.  Genitourinary:  Moderate tenderness right flank  Musculoskeletal: Normal range of motion. She exhibits no edema.  Lymphadenopathy:    She has no cervical adenopathy.  Neurological: She is oriented to person, place, and time. She exhibits normal muscle tone. Coordination normal.  Skin: Rash noted. No erythema.  Healing rash to right flank and abdomen  Psychiatric: She has a normal mood and affect. Her behavior is normal.    ED Course  Procedures (including critical care time) Labs Review Labs Reviewed  CBC WITH DIFFERENTIAL/PLATELET - Abnormal; Notable for the following:    Hemoglobin 11.8 (*)    HCT 34.6 (*)    All other components within normal limits  COMPREHENSIVE  METABOLIC PANEL - Abnormal; Notable for the following:    CO2 21 (*)    Glucose, Bld 322 (*)    Creatinine, Ser 1.20 (*)    AST 10 (*)    ALT 9 (*)    GFR calc non Af Amer 55 (*)    All other components within normal limits  URINALYSIS, ROUTINE W REFLEX MICROSCOPIC (NOT AT [facility]) - Abnormal; Notable for the following:    Specific Gravity, Urine 1.035 (*)    Glucose, UA >1000 (*)    Hgb urine dipstick TRACE (*)    Ketones, ur 15 (*)    Protein, ur 100 (*)    All other components within normal limits  URINE MICROSCOPIC-ADD ON - Abnormal; Notable for the following:    Squamous Epithelial / LPF 0-5 (*)    Bacteria, UA RARE (*)    All other components within normal limits  I-STAT BETA HCG BLOOD, ED ([facility], [facility], [facility] ONLY)    Imaging Review Ct Renal Stone Study  09/28/2015  CLINICAL DATA:  Right-sided abdominal and flank pain for 3 days. Nausea. EXAM: CT ABDOMEN AND PELVIS WITHOUT CONTRAST TECHNIQUE: . Multidetector CT imaging of the abdomen and pelvis was performed following the standard protocol without oral or intravenous contrast material administration. COMPARISON:  April 13, 2015 FINDINGS:  Lower chest: There is a stable 2 mm nodular opacity in the superior segment right lower lobe abutting the pleura. Lung bases otherwise are clear except for mild scarring in the anterior left base which is stable. Hepatobiliary: No focal liver lesions are identified on this noncontrast enhanced study. Gallbladder wall is not appreciably thickened. There is no biliary duct dilatation. Pancreas: No pancreatic mass or inflammatory focus. Spleen: No splenic lesions are evident. Adrenals/Urinary Tract: Adrenals bilaterally appear within normal limits. Kidneys bilaterally show no mass or hydronephrosis on either side. There is a calculus in the lower pole of the right kidney measuring 6 x 5 mm. No other renal calculi are identified. There is no appreciable ureteral calculus on either side. Urinary bladder is midline with wall thickness within normal limits. Note that there are multiple phleboliths in the pelvis, unchanged. Stomach/Bowel: There is no bowel wall or mesenteric thickening. There is fairly diffuse stool throughout the colon. There is no bowel obstruction. No free air or portal venous air. Vascular/Lymphatic: There is atherosclerotic calcification in the aorta. No abdominal aortic aneurysm. No vascular lesions are identified on this noncontrast enhanced study beyond areas of vascular calcification. There is no demonstrable adenopathy in the abdomen or pelvis. Reproductive: Uterus is retroverted. There is no pelvic mass or pelvic fluid collection. Other: Appendix is absent. There is no ascites or abscess in the abdomen or pelvis. Scarring in the umbilical region is likely postoperative in etiology. Musculoskeletal: There are no blastic or lytic bone lesions. There is no intramuscular or abdominal wall lesion. IMPRESSION: 6 x 5 mm calculus lower pole right kidney. No ureteral calculi on either side. No hydronephrosis on either side. Appendix absent. Fairly diffuse stool throughout colon. No bowel obstruction. No  abscess. Stable 2 mm nodular opacity right lower lobe. Stability of this lesion is consistent with benign etiology. Electronically Signed   By: Lowella Grip III M.D.   On: 09/28/2015 12:04   I have personally reviewed and evaluated these images and lab results as part of my medical decision-making.   EKG Interpretation None      MDM   Final diagnoses:  Shingles    Labs are consistent with  dehydration and poorly controlled diabetes. CT scan the abdomen shows a kidney stone in the kidney but otherwise negative. Suspect pain is related to her shingles patient states the Dilaudid help here in the hospital. So she's been given a prescription of Dilaudid will follow-up with her PCP    Milton Ferguson, MD 09/28/15 1400

## 2015-09-28 NOTE — ED Notes (Signed)
Patient transported to CT 

## 2015-09-28 NOTE — Discharge Instructions (Signed)
Follow up with your md in a week if not improving.

## 2015-10-28 ENCOUNTER — Ambulatory Visit: Payer: Medicaid Other | Admitting: Family Medicine

## 2015-11-11 ENCOUNTER — Emergency Department (HOSPITAL_COMMUNITY)
Admission: EM | Admit: 2015-11-11 | Discharge: 2015-11-11 | Disposition: A | Discharge status: Home / Self Care | Payer: Medicaid Other | Attending: Emergency Medicine | Admitting: Emergency Medicine

## 2015-11-11 ENCOUNTER — Encounter (HOSPITAL_COMMUNITY): Payer: Self-pay | Admitting: Emergency Medicine

## 2015-11-11 DIAGNOSIS — K219 Gastro-esophageal reflux disease without esophagitis: Secondary | ICD-10-CM | POA: Diagnosis not present

## 2015-11-11 DIAGNOSIS — Z79899 Other long term (current) drug therapy: Secondary | ICD-10-CM | POA: Insufficient documentation

## 2015-11-11 DIAGNOSIS — L0231 Cutaneous abscess of buttock: Secondary | ICD-10-CM | POA: Insufficient documentation

## 2015-11-11 DIAGNOSIS — Z8669 Personal history of other diseases of the nervous system and sense organs: Secondary | ICD-10-CM | POA: Insufficient documentation

## 2015-11-11 DIAGNOSIS — Z880 Allergy status to penicillin: Secondary | ICD-10-CM | POA: Diagnosis not present

## 2015-11-11 DIAGNOSIS — M545 Low back pain: Secondary | ICD-10-CM | POA: Diagnosis present

## 2015-11-11 DIAGNOSIS — Z8739 Personal history of other diseases of the musculoskeletal system and connective tissue: Secondary | ICD-10-CM | POA: Diagnosis not present

## 2015-11-11 DIAGNOSIS — Z794 Long term (current) use of insulin: Secondary | ICD-10-CM | POA: Diagnosis not present

## 2015-11-11 DIAGNOSIS — L03317 Cellulitis of buttock: Secondary | ICD-10-CM | POA: Diagnosis not present

## 2015-11-11 DIAGNOSIS — I10 Essential (primary) hypertension: Secondary | ICD-10-CM | POA: Diagnosis not present

## 2015-11-11 DIAGNOSIS — J45909 Unspecified asthma, uncomplicated: Secondary | ICD-10-CM | POA: Insufficient documentation

## 2015-11-11 DIAGNOSIS — E119 Type 2 diabetes mellitus without complications: Secondary | ICD-10-CM | POA: Diagnosis not present

## 2015-11-11 DIAGNOSIS — L0291 Cutaneous abscess, unspecified: Secondary | ICD-10-CM

## 2015-11-11 MED ORDER — LIDOCAINE-EPINEPHRINE (PF) 2 %-1:200000 IJ SOLN: 10.0000 mL | Freq: Once | INTRAMUSCULAR | Status: DC

## 2015-11-11 MED ORDER — SULFAMETHOXAZOLE-TRIMETHOPRIM 800-160 MG PO TABS: 1.0000 | ORAL_TABLET | Freq: Two times a day (BID) | ORAL | Status: DC

## 2015-11-11 NOTE — Discharge Instructions (Signed)

## 2015-11-11 NOTE — ED Notes (Signed)
Pt states she has a abscess to her right buttocks. Pt states it has been there since Saturday.

## 2015-11-11 NOTE — ED Notes (Signed)
Patient able to ambulate independently  

## 2015-11-11 NOTE — ED Provider Notes (Signed)
CSN: [id number]     Arrival date & time 11/11/15  2017 History  By signing my name below, I, Christina Orozco, attest that this documentation has been prepared under the direction and in the presence of Janetta Hora, PA-C.   Electronically Signed: Tobe Orozco, ED Scribe. 11/11/2015. 9:10 PM.   Chief Complaint  Patient presents with  . Abscess   The history is provided by the patient. No language interpreter was used.   HPI Comments: Christina Orozco is a 45 y.o. female with a PMHx of DM, HTN, Asthma, GERD, Neuropathy, and DDD who presents to the Emergency Department complaining of gradual onset, gradually worsening, intermittent, burning, lower right buttock pain onset 6 days ago. Pt reports associated edema, erythema, tenderness, difficulty sitting. Pt has hx of similar sx which she says have been abscesses as a result of her DM and elevated sugar levels. Pt reports that she has run out of her diabetic test strips recently. Denies fever, chills, injury. Pt has a follow up with her PCP in 5 days. Pt denies any known bug or tick bites. She has not taken OTC medications or tried home remedies PTA.   Past Medical History  Diagnosis Date  . Diabetes mellitus   . Hypertension   . Asthma   . GERD (gastroesophageal reflux disease)   . Neuropathy ([place])   . DDD (degenerative disc disease), cervical   . Diabetes mellitus ([place])   . Hypertension    Past Surgical History  Procedure Laterality Date  . Tonsillectomy    . Appendectomy    . Adenoidectomy    . Cervical spine surgery  06/2012    C5-C6  . Shoulder surgery     Family History  Problem Relation Age of Onset  . Heart disease    . Cancer      colon, stomach, lung  . Diabetes    . Seizures     Social History  Substance Use Topics  . Smoking status: Never Smoker   . Smokeless tobacco: Never Used  . Alcohol Use: No   OB History    No data available     Review of Systems  Constitutional: Negative for fever.  Skin:       +abscess  to lower right buttock   Allergies  Aspirin and Penicillins  Home Medications   Prior to Admission medications   Medication Sig Start Date End Date Taking? Authorizing Provider  albuterol (PROVENTIL HFA;VENTOLIN HFA) 108 (90 Base) MCG/ACT inhaler Inhale 1-2 puffs into the lungs every 6 (six) hours as needed for wheezing or shortness of breath. 07/13/15   Dorena Dew, FNP  ALPRAZolam Duanne Moron) 0.5 MG tablet Take 1-2 before lumbar puncture. May repeat if needed. Do not drive. Patient not taking: Reported on 08/25/2015 07/28/15   Melvenia Beam, MD  amLODipine (NORVASC) 10 MG tablet Take 1 tablet (10 mg total) by mouth daily. 06/08/15   Dorena Dew, FNP  Blood Glucose Monitoring Suppl (TRUE METRIX AIR GLUCOSE METER) DEVI 1 each by Does not apply route 4 (four) times daily -  before meals and at bedtime. 08/25/15   Dorena Dew, FNP  ergocalciferol (VITAMIN D2) 50000 UNITS capsule Take 1 capsule (50,000 Units total) by mouth once a week. Patient not taking: Reported on 08/25/2015 05/07/15   Dorena Dew, FNP  gabapentin (NEURONTIN) 300 MG capsule Take 2 capsules (600 mg total) by mouth 3 (three) times daily. 07/05/15   Melvenia Beam, MD  glucose blood (TRUE  METRIX BLOOD GLUCOSE TEST) test strip Use as instructed 08/25/15   Dorena Dew, FNP  HYDROmorphone (DILAUDID) 4 MG tablet Take 1 tablet (4 mg total) by mouth every 6 (six) hours as needed for severe pain. 09/28/15   Milton Ferguson, MD  insulin aspart (NOVOLOG) 100 UNIT/ML FlexPen Inject 10 Units into the skin 3 (three) times daily with meals. 07/08/15   Dorena Dew, FNP  insulin detemir (LEVEMIR) 100 UNIT/ML injection Inject 0.15 mLs (15 Units total) into the skin 2 (two) times daily. 07/06/15   Dorena Dew, FNP  Insulin Pen Needle 31G X 6 MM MISC 1 each by Does not apply route 4 (four) times daily - after meals and at bedtime. 07/08/15   Dorena Dew, FNP  Lancet Device MISC 1 each by Does not apply route 4 (four)  times daily -  before meals and at bedtime. 05/05/15   Dorena Dew, FNP  lisinopril (PRINIVIL,ZESTRIL) 20 MG tablet TAKE 1 TABLET(20 MG) BY MOUTH DAILY 07/13/15   Dorena Dew, FNP  meclizine (ANTIVERT) 25 MG tablet Take 1 tablet (25 mg total) by mouth 3 (three) times daily as needed for dizziness. Patient not taking: Reported on 09/28/2015 07/23/15   Micheline Chapman, NP  Olopatadine HCl (PATADAY) 0.2 % SOLN Apply 1 drop to eye daily. 08/25/15   Dorena Dew, FNP  omeprazole (PRILOSEC) 40 MG capsule Take 1 capsule (40 mg total) by mouth daily. Reported on 06/08/2015 06/08/15   Dorena Dew, FNP  oxyCODONE-acetaminophen (PERCOCET) 10-325 MG tablet Take 1 tablet by mouth every 4 (four) hours as needed for pain. 09/22/15   Konrad Felix, PA  traMADol (ULTRAM) 50 MG tablet Take 1 tablet (50 mg total) by mouth every 6 (six) hours as needed. Patient not taking: Reported on 07/13/2015 07/05/15   Melvenia Beam, MD  valACYclovir (VALTREX) 1000 MG tablet Take 1 tablet (1,000 mg total) by mouth 3 (three) times daily. 09/22/15   Konrad Felix, PA   BP 176/86 mmHg  Pulse 78  Temp(Src) 98.8 F (37.1 C) (Oral)  Resp 16  Ht 5\' 6"  (1.676 m)  Wt 166 lb (75.297 kg)  BMI 26.81 kg/m2  SpO2 98%   Physical Exam  Constitutional: She appears well-developed and well-nourished.  HENT:  Head: Normocephalic.  Eyes: Conjunctivae are normal.  Cardiovascular: Normal rate.   Pulmonary/Chest: Effort normal. No respiratory distress.  Abdominal: She exhibits no distension.  Musculoskeletal: Normal range of motion.  Neurological: She is alert.  Skin: Skin is warm and dry.  Abscess over right lower buttocks. Small pustule with 4 cm of surrounding induration. No drainage or fluctuance.   Psychiatric: She has a normal mood and affect. Her behavior is normal.  Nursing note and vitals reviewed.  Chaperone present throughout entire exam.   ED Course  Procedures (including critical care  time)  DIAGNOSTIC STUDIES: Oxygen Saturation is 98% on RA, normal by my interpretation.   COORDINATION OF CARE: 9:08 PM-Discussed next steps with pt including [name]. Pt verbalized understanding and is agreeable with the plan.    Procedure Note:  Ultrasound soft tissue performed by Janetta Hora, PA-C.   Other soft tisse (comment in note) Soft tissue infection No abcess noted - small area of loculations. No large pocket visualized. Cellulitis present  MDM   Final diagnoses:  Abscess   45 year old female who presents with abscess. She is afebrile, not tachycardic or tachypneic, and not hypoxic. She is hypertensive; she  has HTN and is in pain. Abscess is indurated, no area of fluctuance felt. [name] does not reveal area to drain. Patient advised to complete course of antibiotics and use warm compresses. Return if abscess comes to a head and is able to be drained. Patient is NAD, non-toxic, with stable VS. Patient is informed of clinical course, understands medical decision making process, and agrees with plan. Opportunity for questions provided and all questions answered. Return precautions given.  I personally performed the services described in this documentation, which was scribed in my presence. The recorded information has been reviewed and is accurate.     Recardo Evangelist, PA-C 11/12/15 0020  Alfonzo Beers, MD 11/12/15 0030

## 2015-11-16 ENCOUNTER — Ambulatory Visit (INDEPENDENT_AMBULATORY_CARE_PROVIDER_SITE_OTHER): Payer: Medicaid Other | Admitting: Family Medicine

## 2015-11-16 ENCOUNTER — Encounter: Payer: Self-pay | Admitting: Family Medicine

## 2015-11-16 VITALS — BP 156/88 | HR 85 | Temp 98.5°F | Resp 16 | Ht 63.0 in | Wt 159.0 lb

## 2015-11-16 DIAGNOSIS — M791 Myalgia: Secondary | ICD-10-CM | POA: Diagnosis not present

## 2015-11-16 DIAGNOSIS — I10 Essential (primary) hypertension: Secondary | ICD-10-CM

## 2015-11-16 DIAGNOSIS — IMO0001 Reserved for inherently not codable concepts without codable children: Secondary | ICD-10-CM

## 2015-11-16 DIAGNOSIS — M7918 Myalgia, other site: Secondary | ICD-10-CM

## 2015-11-16 DIAGNOSIS — E1140 Type 2 diabetes mellitus with diabetic neuropathy, unspecified: Secondary | ICD-10-CM | POA: Diagnosis not present

## 2015-11-16 DIAGNOSIS — Z794 Long term (current) use of insulin: Secondary | ICD-10-CM

## 2015-11-16 DIAGNOSIS — T814XXD Infection following a procedure, subsequent encounter: Secondary | ICD-10-CM | POA: Diagnosis not present

## 2015-11-16 LAB — POCT URINALYSIS DIP (DEVICE)
BILIRUBIN URINE: NEGATIVE
Glucose, UA: 500 mg/dL — AB
KETONES UR: NEGATIVE mg/dL
LEUKOCYTES UA: NEGATIVE
NITRITE: NEGATIVE
PH: 5.5 (ref 5.0–8.0)
Protein, ur: 30 mg/dL — AB
SPECIFIC GRAVITY, URINE: 1.01 (ref 1.005–1.030)
UROBILINOGEN UA: 0.2 mg/dL (ref 0.0–1.0)

## 2015-11-16 LAB — CBC WITH DIFFERENTIAL/PLATELET
BASOS PCT: 1 %
Basophils Absolute: 65 cells/uL (ref 0–200)
EOS PCT: 2 %
Eosinophils Absolute: 130 cells/uL (ref 15–500)
HEMATOCRIT: 33.6 % — AB (ref 35.0–45.0)
Hemoglobin: 11 g/dL — ABNORMAL LOW (ref 11.7–15.5)
LYMPHS ABS: 1495 {cells}/uL (ref 850–3900)
LYMPHS PCT: 23 %
MCH: 28.6 pg (ref 27.0–33.0)
MCHC: 32.7 g/dL (ref 32.0–36.0)
MCV: 87.5 fL (ref 80.0–100.0)
MONO ABS: 390 {cells}/uL (ref 200–950)
MPV: 10.8 fL (ref 7.5–12.5)
Monocytes Relative: 6 %
Neutro Abs: 4420 cells/uL (ref 1500–7800)
Neutrophils Relative %: 68 %
Platelets: 352 10*3/uL (ref 140–400)
RBC: 3.84 MIL/uL (ref 3.80–5.10)
RDW: 14.4 % (ref 11.0–15.0)
WBC: 6.5 10*3/uL (ref 3.8–10.8)

## 2015-11-16 LAB — HEMOGLOBIN A1C
Hgb A1c MFr Bld: 12.3 % — ABNORMAL HIGH (ref <5.7)
Mean Plasma Glucose: 306 mg/dL

## 2015-11-16 LAB — COMPLETE METABOLIC PANEL WITH GFR
ALT: 6 U/L (ref 6–29)
AST: 9 U/L — AB (ref 10–30)
Albumin: 3.4 g/dL — ABNORMAL LOW (ref 3.6–5.1)
Alkaline Phosphatase: 83 U/L (ref 33–115)
BUN: 14 mg/dL (ref 7–25)
CALCIUM: 8.6 mg/dL (ref 8.6–10.2)
CHLORIDE: 104 mmol/L (ref 98–110)
CO2: 20 mmol/L (ref 20–31)
CREATININE: 1.06 mg/dL (ref 0.50–1.10)
GFR, Est African American: 75 mL/min (ref >=60)
GFR, Est Non African American: 65 mL/min (ref >=60)
GLUCOSE: 385 mg/dL — AB (ref 65–99)
POTASSIUM: 4.5 mmol/L (ref 3.5–5.3)
SODIUM: 134 mmol/L — AB (ref 135–146)
Total Bilirubin: 0.2 mg/dL (ref 0.2–1.2)
Total Protein: 6.7 g/dL (ref 6.1–8.1)

## 2015-11-16 LAB — GLUCOSE, CAPILLARY: GLUCOSE-CAPILLARY: 384 mg/dL — AB (ref 65–99)

## 2015-11-16 MED ORDER — ACCU-CHEK AVIVA PLUS W/DEVICE KIT: 1.0000 | PACK | Freq: Four times a day (QID) | Status: DC

## 2015-11-16 MED ORDER — GLUCOSE BLOOD VI STRP: ORAL_STRIP | Status: DC

## 2015-11-16 MED ORDER — ACETAMINOPHEN-CODEINE #3 300-30 MG PO TABS: 1.0000 | ORAL_TABLET | ORAL | Status: DC | PRN

## 2015-11-16 MED ORDER — SULFAMETHOXAZOLE-TRIMETHOPRIM 800-160 MG PO TABS: 1.0000 | ORAL_TABLET | Freq: Two times a day (BID) | ORAL | Status: AC

## 2015-11-16 NOTE — Progress Notes (Signed)
Subjective:    Patient ID: Christina Orozco, female    DOB: [birth year], 45 y.o.   MRN: [id number]  HPI  [name], a 45 year old female that presents for a 1 month follow up of diabetes and hypertension.  Patient has a history of type 2 diabetes mellitus. Patient reports lower extremity neuropathy, but denies foot ulcerations, increased appetite, nausea, polydipsia, visual disturbances, vomiting, or weight loss. She says that she has not been taking Novolog or Levemir consistently since she ran on of testing strips 1 month ago.  She maintains that prior to running out of testing strips, blood sugars  had been consistently greater than 200 fasting.   Patient is also following up for hypertension.  She is not exercising or following a low sodium diet. She does not check blood pressure at home.  Patient denies dizziness, chest pain, dyspnea, irregular heart beat, lower extremity edema, palpitations and tachypnea.     Christina Orozco was recently evaluated in the emergency department for an abscess to left buttocks. She was started on Bactrim 800-160, she started taking medication on 11/14/2015. She says that abscess is worsening, it is painful to sit. She rates pain intensity at 7/10 described as throbbing and constant. She says that she has had chills over the past several days, but has not taken temperature at home. She is afebrile at [facility]. Patient presents for evaluation of a cutaneous abscess. She currently denies tachycardia, shortness of breath, fatigue, or chest pains.   Past Medical History  Diagnosis Date  . Diabetes mellitus   . Hypertension   . Asthma   . GERD (gastroesophageal reflux disease)   . Neuropathy ([place])   . DDD (degenerative disc disease), cervical   . Diabetes mellitus ([place])   . Hypertension    Social History   Social History  . Marital Status: Single    Spouse Name: N/A  . Number of Children: 3  . Years of Education: 12+   Occupational History  . [employer]     Social History Main Topics  . Smoking status: Never Smoker   . Smokeless tobacco: Never Used  . Alcohol Use: No  . Drug Use: No  . Sexual Activity: Not on file   Other Topics Concern  . Not on file   Social History Narrative   Lives with daughter   Caffeine use: daily   Immunization History  Administered Date(s) Administered  . Influenza,inj,Quad PF,36+ Mos 05/05/2015  . Pneumococcal Polysaccharide-23 05/05/2015   Past Surgical History  Procedure Laterality Date  . Tonsillectomy    . Appendectomy    . Adenoidectomy    . Cervical spine surgery  06/2012    C5-C6  . Shoulder surgery     Review of Systems  Constitutional: Positive for fatigue. Negative for malaise/fatigue.  HENT: Negative.   Eyes: Negative.   Respiratory: Negative.   Cardiovascular: Negative.  Negative for chest pain, palpitations, orthopnea and leg swelling.  Gastrointestinal: Negative.   Endocrine: Negative for polydipsia, polyphagia and polyuria.  Genitourinary: Negative.  Negative for urgency, frequency and flank pain.  Musculoskeletal: Positive for myalgias (Right arm pain. Has history of carpal tunnel syndrome). Negative for neck pain.       Left foot pain/neuropathy  Skin: Negative.   Allergic/Immunologic: Negative.  Negative for immunocompromised state.  Neurological: Positive for numbness. Negative for dizziness, weakness and light-headedness.       Neuropathy  Hematological: Negative.   Psychiatric/Behavioral: Negative for suicidal ideas and  sleep disturbance.       Objective:   Physical Exam  Constitutional: She is oriented to person, place, and time. She appears well-developed and well-nourished.  HENT:  Head: Normocephalic and atraumatic.  Right Ear: External ear normal.  Left Ear: External ear normal.  Nose: Nose normal.  Mouth/Throat: Oropharynx is clear and moist.  Eyes: Conjunctivae and EOM are normal. Pupils are equal, round, and reactive to light.  Neck: Normal  range of motion. Neck supple.  Cardiovascular: Normal rate, regular rhythm, normal heart sounds and intact distal pulses.   Pulmonary/Chest: Effort normal and breath sounds normal.  Abdominal: Soft. Bowel sounds are normal.  Musculoskeletal: Normal range of motion.  Neurological: She is alert and oriented to person, place, and time. She has normal reflexes. She displays no atrophy. No cranial nerve deficit or sensory deficit. She exhibits normal muscle tone. Gait normal.  Skin: Skin is warm.     Psychiatric: She has a normal mood and affect. Her behavior is normal. Judgment and thought content normal.      BP 164/85 mmHg  Pulse 85  Temp(Src) 98.5 F (36.9 C) (Oral)  Resp 16  Ht '5\' 3"'  (1.6 m)  Wt 159 lb (72.122 kg)  BMI 28.17 kg/m2  SpO2 100%  LMP 11/13/2015 Assessment & Plan:    1. Type 2 diabetes mellitus with diabetic neuropathy, with long-term current use of insulin (HCC) There have been some probable compliance issues here. I have discussed with her the great importance of following the treatment plan exactly as directed in order to achieve a good medical outcome. Advised to restart antidiabetic medications as previously prescribed. Will follow up by phone on tomorrow to discuss laboratory results.  - glucose blood (ACCU-CHEK AVIVA) test strip; Use as instructed  Dispense: 100 each; Refill: 12 - Blood Glucose Monitoring Suppl (ACCU-CHEK AVIVA PLUS) w/Device KIT; 1 each by Does not apply route QID.  Dispense: 1 kit; Refill: 0 - COMPLETE METABOLIC PANEL WITH GFR - Hemoglobin A1c  2. Essential hypertension Blood pressure is above goal on current medication regimen. Patient has been taking anti-hypertensive medications. Current pain intensity is 7/10. Patient to return for bp check in 2 weeks.   3. Wound abscess, subsequent encounter Will continue Bactrim for a total of 14 days Will send a referral to general surgery for further treatment and evaluation.  Will send a wound  culture for sensitivity panel. 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 Reminded Christina Orozco to seek medical attention if abscess becomes more painful or she develops a red streak going away from abscess Also, seek medical attention if fever is 101.5 or greater  - sulfamethoxazole-trimethoprim (BACTRIM DS,SEPTRA DS) 800-160 MG tablet; Take 1 tablet by mouth 2 (two) times daily.  Dispense: 14 tablet; Refill: 0 - Ambulatory referral to General Surgery - CBC with Differential - Culture, routine-abscess  4. Acute buttock pain - acetaminophen-codeine (TYLENOL #3) 300-30 MG tablet; Take 1 tablet by mouth every 4 (four) hours as needed for moderate pain.  Dispense: 30 tablet; Refill: 0     RTC: 1 month follow up for DMII and hypertension. Will follow up by phone with laboratory results.  Glynn Freas M, FNP  The patient was given clear instructions to go to ER or return to medical center if symptoms do not improve, worsen or new problems develop. The patient verbalized understanding. Will notify patient with laboratory results.

## 2015-11-16 NOTE — Patient Instructions (Signed)

## 2015-11-17 ENCOUNTER — Emergency Department (HOSPITAL_COMMUNITY)
Admission: EM | Admit: 2015-11-17 | Discharge: 2015-11-17 | Disposition: A | Discharge status: Home / Self Care | Payer: Medicaid Other | Attending: Emergency Medicine | Admitting: Emergency Medicine

## 2015-11-17 ENCOUNTER — Encounter (HOSPITAL_COMMUNITY): Payer: Self-pay | Admitting: Emergency Medicine

## 2015-11-17 DIAGNOSIS — Z79899 Other long term (current) drug therapy: Secondary | ICD-10-CM | POA: Insufficient documentation

## 2015-11-17 DIAGNOSIS — Z794 Long term (current) use of insulin: Secondary | ICD-10-CM | POA: Diagnosis not present

## 2015-11-17 DIAGNOSIS — E1140 Type 2 diabetes mellitus with diabetic neuropathy, unspecified: Secondary | ICD-10-CM | POA: Insufficient documentation

## 2015-11-17 DIAGNOSIS — I10 Essential (primary) hypertension: Secondary | ICD-10-CM | POA: Insufficient documentation

## 2015-11-17 DIAGNOSIS — J45909 Unspecified asthma, uncomplicated: Secondary | ICD-10-CM | POA: Insufficient documentation

## 2015-11-17 DIAGNOSIS — L0231 Cutaneous abscess of buttock: Secondary | ICD-10-CM | POA: Diagnosis not present

## 2015-11-17 DIAGNOSIS — R200 Anesthesia of skin: Secondary | ICD-10-CM | POA: Diagnosis present

## 2015-11-17 DIAGNOSIS — R002 Palpitations: Secondary | ICD-10-CM | POA: Diagnosis not present

## 2015-11-17 LAB — COMPREHENSIVE METABOLIC PANEL WITH GFR
ALT: 10 U/L — ABNORMAL LOW (ref 14–54)
AST: 12 U/L — ABNORMAL LOW (ref 15–41)
Albumin: 3.5 g/dL (ref 3.5–5.0)
Alkaline Phosphatase: 77 U/L (ref 38–126)
Anion gap: 8 (ref 5–15)
BUN: 18 mg/dL (ref 6–20)
CO2: 20 mmol/L — ABNORMAL LOW (ref 22–32)
Calcium: 8.8 mg/dL — ABNORMAL LOW (ref 8.9–10.3)
Chloride: 107 mmol/L (ref 101–111)
Creatinine, Ser: 1.09 mg/dL — ABNORMAL HIGH (ref 0.44–1.00)
GFR calc Af Amer: >60 mL/min (ref >60)
GFR calc non Af Amer: >60 mL/min (ref >60)
Glucose, Bld: 393 mg/dL — ABNORMAL HIGH (ref 65–99)
Potassium: 3.7 mmol/L (ref 3.5–5.1)
Sodium: 135 mmol/L (ref 135–145)
Total Bilirubin: 0.4 mg/dL (ref 0.3–1.2)
Total Protein: 7.7 g/dL (ref 6.5–8.1)

## 2015-11-17 LAB — CBC
HEMATOCRIT: 31.8 % — AB (ref 36.0–46.0)
Hemoglobin: 10.8 g/dL — ABNORMAL LOW (ref 12.0–15.0)
MCH: 28.8 pg (ref 26.0–34.0)
MCHC: 34 g/dL (ref 30.0–36.0)
MCV: 84.8 fL (ref 78.0–100.0)
PLATELETS: 344 10*3/uL (ref 150–400)
RBC: 3.75 MIL/uL — ABNORMAL LOW (ref 3.87–5.11)
RDW: 13.3 % (ref 11.5–15.5)
WBC: 6.5 10*3/uL (ref 4.0–10.5)

## 2015-11-17 MED ORDER — METOPROLOL TARTRATE 25 MG PO TABS: 25.0000 mg | ORAL_TABLET | Freq: Two times a day (BID) | ORAL | Status: DC

## 2015-11-17 MED ORDER — LIDOCAINE-EPINEPHRINE (PF) 2 %-1:200000 IJ SOLN
20.0000 mL | Freq: Once | INTRAMUSCULAR | Status: AC
  Administered 2015-11-17: 20 mL
  Filled 2015-11-17: qty 20

## 2015-11-17 NOTE — ED Notes (Signed)
Pt c/o bilateral, lower face numbness/drooping and L are numbness starting this afternoon.  Facial symmetry and equal grips noted.

## 2015-11-17 NOTE — ED Notes (Signed)
Patient was alert, oriented and stable upon discharge. RN went over AVS and patient had no further questions.  

## 2015-11-17 NOTE — ED Provider Notes (Signed)
CSN: [id number]     Arrival date & time 11/17/15  1335 History   First MD Initiated Contact with Patient 11/17/15 1420     Chief Complaint  Patient presents with  . Numbness     HPI Patient presents the emergency department with acute onset palpitations.  She felt like her heart was racing.  This is happening intermittently before in the past but is usually resolved this one lasted for some period longer and is concerned her.  She then developed numbness of her left upper extremity and perioral dysesthesias.  She also reports ongoing numbness and tingling of the lips.  She reports the palpitations have resolved.  She reports she's been dealing with an abscess to right gluteal region that does not appear to be improving with warm compresses and antibiotics.  This is been present over the past 5-6 days.   Past Medical History  Diagnosis Date  . Diabetes mellitus   . Hypertension   . Asthma   . GERD (gastroesophageal reflux disease)   . Neuropathy ([place])   . DDD (degenerative disc disease), cervical   . Diabetes mellitus ([place])   . Hypertension    Past Surgical History  Procedure Laterality Date  . Tonsillectomy    . Appendectomy    . Adenoidectomy    . Cervical spine surgery  06/2012    C5-C6  . Shoulder surgery     Family History  Problem Relation Age of Onset  . Heart disease    . Cancer      colon, stomach, lung  . Diabetes    . Seizures     Social History  Substance Use Topics  . Smoking status: Never Smoker   . Smokeless tobacco: Never Used  . Alcohol Use: No   OB History    No data available     Review of Systems  All other systems reviewed and are negative.     Allergies  Aspirin and Penicillins  Home Medications   Prior to Admission medications   Medication Sig Start Date End Date Taking? Authorizing Provider  acetaminophen-codeine (TYLENOL #3) 300-30 MG tablet Take 1 tablet by mouth every 4 (four) hours as needed for moderate pain. 11/16/15  Yes  Dorena Dew, FNP  albuterol (PROVENTIL HFA;VENTOLIN HFA) 108 (90 Base) MCG/ACT inhaler Inhale 1-2 puffs into the lungs every 6 (six) hours as needed for wheezing or shortness of breath. 07/13/15  Yes Dorena Dew, FNP  amLODipine (NORVASC) 10 MG tablet Take 1 tablet (10 mg total) by mouth daily. 06/08/15  Yes Dorena Dew, FNP  gabapentin (NEURONTIN) 300 MG capsule Take 2 capsules (600 mg total) by mouth 3 (three) times daily. Patient taking differently: Take 600 mg by mouth every other day. 600 mg 3 times daily every other day 07/05/15  Yes Melvenia Beam, MD  insulin aspart (NOVOLOG) 100 UNIT/ML FlexPen Inject 10 Units into the skin 3 (three) times daily with meals. 07/08/15  Yes Dorena Dew, FNP  insulin detemir (LEVEMIR) 100 UNIT/ML injection Inject 0.15 mLs (15 Units total) into the skin 2 (two) times daily. 07/06/15  Yes Dorena Dew, FNP  lisinopril (PRINIVIL,ZESTRIL) 20 MG tablet TAKE 1 TABLET(20 MG) BY MOUTH DAILY 07/13/15  Yes Dorena Dew, FNP  Olopatadine HCl (PATADAY) 0.2 % SOLN Apply 1 drop to eye daily. Patient taking differently: Apply 1 drop to eye daily as needed (allergies).  08/25/15  Yes Dorena Dew, FNP  omeprazole (PRILOSEC) 40 MG capsule  Take 1 capsule (40 mg total) by mouth daily. Reported on 06/08/2015 06/08/15  Yes Dorena Dew, FNP  sulfamethoxazole-trimethoprim (BACTRIM DS,SEPTRA DS) 800-160 MG tablet Take 1 tablet by mouth 2 (two) times daily. 11/16/15 11/23/15 Yes Dorena Dew, FNP  Blood Glucose Monitoring Suppl (ACCU-CHEK AVIVA PLUS) w/Device KIT 1 each by Does not apply route QID. 11/16/15   Dorena Dew, FNP  glucose blood (ACCU-CHEK AVIVA) test strip Use as instructed 11/16/15   Dorena Dew, FNP  Insulin Pen Needle 31G X 6 MM MISC 1 each by Does not apply route 4 (four) times daily - after meals and at bedtime. 07/08/15   Dorena Dew, FNP  Lancet Device MISC 1 each by Does not apply route 4 (four) times daily -  before meals  and at bedtime. 05/05/15   Dorena Dew, FNP  metoprolol (LOPRESSOR) 25 MG tablet Take 1 tablet (25 mg total) by mouth 2 (two) times daily. 11/17/15   Jola Schmidt, MD  oxyCODONE-acetaminophen (PERCOCET) 10-325 MG tablet Take 1 tablet by mouth every 4 (four) hours as needed for pain. Patient not taking: Reported on 11/16/2015 09/22/15   Konrad Felix, PA  valACYclovir (VALTREX) 1000 MG tablet Take 1 tablet (1,000 mg total) by mouth 3 (three) times daily. Patient not taking: Reported on 11/16/2015 09/22/15   Konrad Felix, PA   BP 168/96 mmHg  Pulse 98  Temp(Src) 98.5 F (36.9 C) (Oral)  Resp 16  SpO2 98%  LMP 11/13/2015 Physical Exam  Constitutional: She is oriented to person, place, and time. She appears well-developed and well-nourished. No distress.  HENT:  Head: Normocephalic and atraumatic.  Eyes: EOM are normal.  Neck: Normal range of motion.  Cardiovascular: Normal rate, regular rhythm and normal heart sounds.   Pulmonary/Chest: Effort normal and breath sounds normal.  Abdominal: Soft. She exhibits no distension. There is no tenderness.  Genitourinary:  Large right gluteal abscess without drainage.  Significant induration and mild fluctuance.  No spreading erythema  Musculoskeletal: Normal range of motion.  Neurological: She is alert and oriented to person, place, and time.  Skin: Skin is warm and dry.  Psychiatric: She has a normal mood and affect. Judgment normal.  Nursing note and vitals reviewed.   ED Course  .Marland KitchenIncision and Drainage Performed by: Jola Schmidt Authorized by: Jola Schmidt   (including critical care time)  ++++++++++++++++++++++++++++++++++++++++++++++++++++++++  INCISION AND DRAINAGE Performed by: Hoy Morn Consent: Verbal consent obtained. Risks and benefits: risks, benefits and alternatives were discussed Time out performed prior to procedure Type: abscess Body area: Right gluteal region Anesthesia: local infiltration Incision was  made with a scalpel. Local anesthetic: lidocaine 2 % with epinephrine Anesthetic total: 10 ml Complexity: complex Blunt dissection to break up loculations Drainage: purulent Drainage amount: Large  Packing material: None  Patient tolerance: Patient tolerated the procedure well with no immediate complications.   +++++++++++++++++++++++++++++++++++++++++++++++++++++++++++++++   Labs Review Labs Reviewed  CBC - Abnormal; Notable for the following:    RBC 3.75 (*)    Hemoglobin 10.8 (*)    HCT 31.8 (*)    All other components within normal limits  COMPREHENSIVE METABOLIC PANEL - Abnormal; Notable for the following:    CO2 20 (*)    Glucose, Bld 393 (*)    Creatinine, Ser 1.09 (*)    Calcium 8.8 (*)    AST 12 (*)    ALT 10 (*)    All other components within normal limits  CBG MONITORING, ED  Imaging Review No results found. I have personally reviewed and evaluated these images and lab results as part of my medical decision-making.   EKG Interpretation   Date/Time:  Wednesday November 17 2015 13:50:42 EDT Ventricular Rate:  98 PR Interval:  125 QRS Duration: 74 QT Interval:  355 QTC Calculation: 453 R Axis:   66 Text Interpretation:  Sinus rhythm Left atrial enlargement Anteroseptal  infarct, age indeterminate No significant change was found Confirmed by  Merina Behrendt  MD, Lennette Bihari ([id number]) on 11/17/2015 4:09:16 PM      MDM   Final diagnoses:  Palpitations  Abscess, gluteal, right    Palpitations.  Sounds like SVT versus A. fib with RVR.  This resolved spontaneously.  She is in normal sinus rhythm at this time.  She'll be placed on low-dose twice a day metoprolol.  Outpatient cardiology follow-up.  I think the numbness and tingling of her face and hands is secondary to anxiety from the palpitations.  I do not believe this is a presentation of a stroke or low output stroke.  In regards to her abscess this was incised at the bedside with a large amount of drainage.  She will  continue on antibiotics.  She states she has 7 days of antibiotics left.  She is currently on Bactrim.  She understands to return to the ER for new or worsening symptoms.  Long discussion had regarding palpitations and need for reduction of any stimulants including caffeine.    Jola Schmidt, MD 11/17/15 [phone]

## 2015-11-17 NOTE — Discharge Instructions (Signed)
Abscess An abscess is an infected area that contains a collection of pus and debris.It can occur in almost any part of the body. An abscess is also known as a furuncle or boil. CAUSES  An abscess occurs when tissue gets infected. This can occur from blockage of oil or sweat glands, infection of hair follicles, or a minor injury to the skin. As the body tries to fight the infection, pus collects in the area and creates pressure under the skin. This pressure causes pain. People with weakened immune systems have difficulty fighting infections and get certain abscesses more often.  SYMPTOMS Usually an abscess develops on the skin and becomes a painful mass that is red, warm, and tender. If the abscess forms under the skin, you may feel a moveable soft area under the skin. Some abscesses break open (rupture) on their own, but most will continue to get worse without care. The infection can spread deeper into the body and eventually into the bloodstream, causing you to feel ill.  DIAGNOSIS  Your caregiver will take your medical history and perform a physical exam. A sample of fluid may also be taken from the abscess to determine what is causing your infection. TREATMENT  Your caregiver may prescribe antibiotic medicines to fight the infection. However, taking antibiotics alone usually does not cure an abscess. Your caregiver may need to make a small cut (incision) in the abscess to drain the pus. In some cases, gauze is packed into the abscess to reduce pain and to continue draining the area. HOME CARE INSTRUCTIONS   Only take over-the-counter or prescription medicines for pain, discomfort, or fever as directed by your caregiver.  If you were prescribed antibiotics, take them as directed. Finish them even if you start to feel better.  If gauze is used, follow your caregiver's directions for changing the gauze.  To avoid spreading the infection:  Keep your draining abscess covered with a  bandage.  Wash your hands well.  Do not share personal care items, towels, or whirlpools with others.  Avoid skin contact with others.  Keep your skin and clothes clean around the abscess.  Keep all follow-up appointments as directed by your caregiver. SEEK MEDICAL CARE IF:   You have increased pain, swelling, redness, fluid drainage, or bleeding.  You have muscle aches, chills, or a general ill feeling.  You have a fever. MAKE SURE YOU:   Understand these instructions.  Will watch your condition.  Will get help right away if you are not doing well or get worse.   This information is not intended to replace advice given to you by your health care provider. Make sure you discuss any questions you have with your health care provider.   Document Released: 03/08/2005 Document Revised: 11/28/2011 Document Reviewed: 08/11/2011 Elsevier Interactive Patient Education 2016 [employer]. Palpitations A palpitation is the feeling that your heartbeat is irregular or is faster than normal. It may feel like your heart is fluttering or skipping a beat. Palpitations are usually not a serious problem. However, in some cases, you may need further medical evaluation. CAUSES  Palpitations can be caused by:  Smoking.  Caffeine or other stimulants, such as diet pills or energy drinks.  Alcohol.  Stress and anxiety.  Strenuous physical activity.  Fatigue.  Certain medicines.  Heart disease, especially if you have a history of irregular heart rhythms (arrhythmias), such as atrial fibrillation, atrial flutter, or supraventricular tachycardia.  An improperly working pacemaker or defibrillator. DIAGNOSIS  To find  the cause of your palpitations, your health care provider will take your medical history and perform a physical exam. Your health care provider may also have you take a test called an ambulatory electrocardiogram (ECG). An ECG records your heartbeat patterns over a 24-hour period.  You may also have other tests, such as:  Transthoracic echocardiogram (TTE). During echocardiography, sound waves are used to evaluate how blood flows through your heart.  Transesophageal echocardiogram (TEE).  Cardiac monitoring. This allows your health care provider to monitor your heart rate and rhythm in real time.  Holter monitor. This is a portable device that records your heartbeat and can help diagnose heart arrhythmias. It allows your health care provider to track your heart activity for several days, if needed.  Stress tests by exercise or by giving medicine that makes the heart beat faster. TREATMENT  Treatment of palpitations depends on the cause of your symptoms and can vary greatly. Most cases of palpitations do not require any treatment other than time, relaxation, and monitoring your symptoms. Other causes, such as atrial fibrillation, atrial flutter, or supraventricular tachycardia, usually require further treatment. HOME CARE INSTRUCTIONS   Avoid:  Caffeinated coffee, tea, soft drinks, diet pills, and energy drinks.  Chocolate.  Alcohol.  Stop smoking if you smoke.  Reduce your stress and anxiety. Things that can help you relax include:  A method of controlling things in your body, such as your heartbeats, with your mind (biofeedback).  Yoga.  Meditation.  Physical activity such as swimming, jogging, or walking.  Get plenty of rest and sleep. SEEK MEDICAL CARE IF:   You continue to have a fast or irregular heartbeat beyond 24 hours.  Your palpitations occur more often. SEEK IMMEDIATE MEDICAL CARE IF:  You have chest pain or shortness of breath.  You have a severe headache.  You feel dizzy or you faint. MAKE SURE YOU:  Understand these instructions.  Will watch your condition.  Will get help right away if you are not doing well or get worse.   This information is not intended to replace advice given to you by your health care provider. Make  sure you discuss any questions you have with your health care provider.   Document Released: 05/26/2000 Document Revised: 06/03/2013 Document Reviewed: 07/28/2011 Elsevier Interactive Patient Education [employer].

## 2015-11-17 NOTE — ED Notes (Addendum)
Pt c/o arm numbness and tingling around the lips since 1330. Pt reports intermittent SOB. Denies chest pain. Pt also c/o abscess to right buttock.

## 2015-11-18 ENCOUNTER — Other Ambulatory Visit: Payer: Self-pay | Admitting: Family Medicine

## 2015-11-18 ENCOUNTER — Telehealth: Payer: Self-pay

## 2015-11-18 DIAGNOSIS — B379 Candidiasis, unspecified: Secondary | ICD-10-CM

## 2015-11-18 MED ORDER — FLUCONAZOLE 150 MG PO TABS: 150.0000 mg | ORAL_TABLET | Freq: Once | ORAL | Status: DC

## 2015-11-18 NOTE — Telephone Encounter (Signed)
-----   Message from Dorena Dew, [place] sent at 11/17/2015  1:22 PM EDT ----- Regarding: lab results Please inform Christina Orozco that hemoglobin a1C is 12.3. Please re-start anti-diabetic medications as previously prescribed. Sent a new glucometer with strips to pharmacy. Please follow up in office as scheduled.    Thanks  ----- Message -----    From: Lab In [facility] Interface    Sent: 11/16/2015   1:38 PM      To: Dorena Dew, FNP

## 2015-11-18 NOTE — Telephone Encounter (Signed)
Called patient, advised of hgba1c and to re-start diabetic medications as directed. Patient verbalized understanding and appointment was scheduled for 1 month follow up. Thanks!

## 2015-11-19 ENCOUNTER — Telehealth: Payer: Self-pay | Admitting: Family Medicine

## 2015-11-19 LAB — CULTURE, ROUTINE-ABSCESS
Gram Stain: NONE SEEN
Gram Stain: NONE SEEN
Gram Stain: NONE SEEN

## 2015-11-19 NOTE — Telephone Encounter (Signed)
Reviewed abscess culture results, moderate Methacillin resistent Staphylococcus Aureus present. Patient was prescribed Bactrim for 14 days. Stressed the importance of taking medication consistently. Will follow up with patient on 11/22/2015.   Dorena Dew, FNP

## 2015-11-22 ENCOUNTER — Ambulatory Visit (INDEPENDENT_AMBULATORY_CARE_PROVIDER_SITE_OTHER): Payer: Medicaid Other | Admitting: Family Medicine

## 2015-11-22 ENCOUNTER — Encounter: Payer: Self-pay | Admitting: Family Medicine

## 2015-11-22 VITALS — BP 128/90 | HR 82 | Temp 98.8°F | Resp 14 | Ht 63.0 in | Wt 159.0 lb

## 2015-11-22 DIAGNOSIS — M7918 Myalgia, other site: Secondary | ICD-10-CM

## 2015-11-22 DIAGNOSIS — Z794 Long term (current) use of insulin: Secondary | ICD-10-CM

## 2015-11-22 DIAGNOSIS — F32A Depression, unspecified: Secondary | ICD-10-CM

## 2015-11-22 DIAGNOSIS — F329 Major depressive disorder, single episode, unspecified: Secondary | ICD-10-CM | POA: Diagnosis not present

## 2015-11-22 DIAGNOSIS — E1140 Type 2 diabetes mellitus with diabetic neuropathy, unspecified: Secondary | ICD-10-CM | POA: Diagnosis not present

## 2015-11-22 DIAGNOSIS — M791 Myalgia: Secondary | ICD-10-CM

## 2015-11-22 DIAGNOSIS — L0291 Cutaneous abscess, unspecified: Secondary | ICD-10-CM

## 2015-11-22 DIAGNOSIS — F411 Generalized anxiety disorder: Secondary | ICD-10-CM | POA: Diagnosis not present

## 2015-11-22 LAB — GLUCOSE, CAPILLARY: Glucose-Capillary: 288 mg/dL — ABNORMAL HIGH (ref 65–99)

## 2015-11-22 MED ORDER — BUSPIRONE HCL 5 MG PO TABS: 5.0000 mg | ORAL_TABLET | Freq: Two times a day (BID) | ORAL | Status: DC

## 2015-11-22 NOTE — Progress Notes (Signed)
Subjective:    Patient ID: Christina Orozco, female    DOB: 06-Dec-1970, 45 y.o.   MRN: [id number]  HPI  Christina Orozco, a 45 year old female that presents for a 1 month follow up left buttocks abscess.  Christina Orozco was recently evaluated in the emergency department for an abscess to left buttocks. She was started on Bactrim 800-160, she started taking medication on 11/14/2015. Left buttocks abscess was incised in the emergency department on 11/17/2015 with a large amount of drainage. She says that abscess is continuing to drain, it is painful to sit. She rates pain intensity at 6/10 described as throbbing and constant. She says that she has had chills over the past several days, but has not taken temperature at home. She is afebrile at [facility]. Patient presents for evaluation of a cutaneous abscess. She currently denies tachycardia, shortness of breath, fatigue, or chest pains.   Christina Orozco is complaining of anxiety and depression.    She has the following symptoms: difficulty concentrating, feelings of losing control, insomnia, irritable, racing thoughts. Onset of symptoms was approximately several months ago. She denies current suicidal and homicidal ideation. She is currently under the care of therapist for depression. She says treatment has not been satisfactory.   Past Medical History  Diagnosis Date  . Diabetes mellitus   . Hypertension   . Asthma   . GERD (gastroesophageal reflux disease)   . Neuropathy ([place])   . DDD (degenerative disc disease), cervical   . Diabetes mellitus ([place])   . Hypertension    Social History   Social History  . Marital Status: Single    Spouse Name: N/A  . Number of Children: 3  . Years of Education: 12+   Occupational History  . [employer]     Social History Main Topics  . Smoking status: Never Smoker   . Smokeless tobacco: Never Used  . Alcohol Use: No  . Drug Use: No  . Sexual Activity: Not on file   Other Topics Concern  . Not on  file   Social History Narrative   Lives with daughter   Caffeine use: daily   Immunization History  Administered Date(s) Administered  . Influenza,inj,Quad PF,36+ Mos 05/05/2015  . Pneumococcal Polysaccharide-23 05/05/2015   Past Surgical History  Procedure Laterality Date  . Tonsillectomy    . Appendectomy    . Adenoidectomy    . Cervical spine surgery  06/2012    C5-C6  . Shoulder surgery     Review of Systems  Constitutional: Positive for fatigue.  HENT: Negative.   Eyes: Negative.   Respiratory: Negative.   Cardiovascular: Negative.  Negative for chest pain, palpitations and leg swelling.  Gastrointestinal: Negative.   Endocrine: Negative for polydipsia, polyphagia and polyuria.  Genitourinary: Negative.  Negative for urgency, frequency and flank pain.  Musculoskeletal: Negative.  Negative for neck pain.       Left foot pain/neuropathy  Skin: Negative.        Left buttocks abscess  Allergic/Immunologic: Negative.  Negative for immunocompromised state.  Neurological: Negative for dizziness, weakness and light-headedness.       Neuropathy  Hematological: Negative.   Psychiatric/Behavioral: Negative.  Negative for suicidal ideas and sleep disturbance.       Objective:   Physical Exam  Constitutional: She is oriented to person, place, and time. She appears well-developed and well-nourished.  HENT:  Head: Normocephalic and atraumatic.  Right Ear: External ear normal.  Left Ear: External ear normal.  Nose: Nose normal.  Mouth/Throat: Oropharynx is clear and moist.  Eyes: Conjunctivae and EOM are normal. Pupils are equal, round, and reactive to light.  Neck: Normal range of motion. Neck supple.  Cardiovascular: Normal rate, regular rhythm, normal heart sounds and intact distal pulses.   Pulmonary/Chest: Effort normal and breath sounds normal.  Abdominal: Soft. Bowel sounds are normal.  Musculoskeletal: Normal range of motion.  Neurological: She is alert and  oriented to person, place, and time. She has normal reflexes. She displays no atrophy. No cranial nerve deficit or sensory deficit. She exhibits normal muscle tone. Gait normal.  Skin: Skin is warm.     Psychiatric: She has a normal mood and affect. Her behavior is normal. Judgment and thought content normal.      BP 128/90 mmHg  Pulse 82  Temp(Src) 98.8 F (37.1 C) (Oral)  Resp 14  Ht 5\' 3"  (1.6 m)  Wt 159 lb (72.122 kg)  BMI 28.17 kg/m2  SpO2 100%  LMP 11/13/2015 Assessment & Plan:   1. Abscess Will continue Bactrim for a total of 14 days Will send a referral to general surgery for further treatment and evaluation.  Will send a wound culture for sensitivity panel. 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 Reminded Christina Orozco to seek medical attention if abscess becomes more painful or she develops a red streak going away from abscess Also, seek medical attention if fever is 101.5 or greater  2. Acute buttock pain Continue Tylenol with codeine as previously prescribed for moderate to severe pain.   3. Depression Will start a trial of Buspar for depression. She denies suicidal or homicidal intent.  - busPIRone (BUSPAR) 5 MG tablet; Take 1 tablet (5 mg total) by mouth 2 (two) times daily.  Dispense: 60 tablet; Refill: 2  4. Generalized anxiety disorder  - busPIRone (BUSPAR) 5 MG tablet; Take 1 tablet (5 mg total) by mouth 2 (two) times daily.  Dispense: 60 tablet; Refill: 2 GAD 7 : Generalized Anxiety Score 11/22/2015  Nervous, Anxious, on Edge 3  Control/stop worrying 3  Worry too much - different things 3  Trouble relaxing 2  Restless 2  Easily annoyed or irritable 2  Afraid - awful might happen 2  Total GAD 7 Score 17     5. Type 2 diabetes mellitus with diabetic neuropathy, with long-term current use of insulin (HCC) Continue antidiabetic medication at current dosage.  There have been some probable compliance issues here. I have discussed with her the great importance of  following the treatment plan exactly as directed in order to achieve a good medical outcome. Advised to restart antidiabetic medications as previously prescribed. Will follow up by phone on tomorrow to discuss laboratory results.     [id number] follow up in 2 weeks for abscess. 1 month follow up for DMII and hypertension Hollis,Lachina M, FNP  The patient was given clear instructions to go to ER or return to medical center if symptoms do not improve, worsen or new problems develop. The patient verbalized understanding. Will notify patient with laboratory results.

## 2015-11-22 NOTE — Patient Instructions (Signed)
Abscess An abscess is an infected area that contains a collection of pus and debris.It can occur in almost any part of the body. An abscess is also known as a furuncle or boil. CAUSES  An abscess occurs when tissue gets infected. This can occur from blockage of oil or sweat glands, infection of hair follicles, or a minor injury to the skin. As the body tries to fight the infection, pus collects in the area and creates pressure under the skin. This pressure causes pain. People with weakened immune systems have difficulty fighting infections and get certain abscesses more often.  SYMPTOMS Usually an abscess develops on the skin and becomes a painful mass that is red, warm, and tender. If the abscess forms under the skin, you may feel a moveable soft area under the skin. Some abscesses break open (rupture) on their own, but most will continue to get worse without care. The infection can spread deeper into the body and eventually into the bloodstream, causing you to feel ill.  DIAGNOSIS  Your caregiver will take your medical history and perform a physical exam. A sample of fluid may also be taken from the abscess to determine what is causing your infection. TREATMENT  Your caregiver may prescribe antibiotic medicines to fight the infection. However, taking antibiotics alone usually does not cure an abscess. Your caregiver may need to make a small cut (incision) in the abscess to drain the pus. In some cases, gauze is packed into the abscess to reduce pain and to continue draining the area. HOME CARE INSTRUCTIONS   Only take over-the-counter or prescription medicines for pain, discomfort, or fever as directed by your caregiver.  If you were prescribed antibiotics, take them as directed. Finish them even if you start to feel better.  If gauze is used, follow your caregiver's directions for changing the gauze.  To avoid spreading the infection:  Keep your draining abscess covered with a  bandage.  Wash your hands well.  Do not share personal care items, towels, or whirlpools with others.  Avoid skin contact with others.  Keep your skin and clothes clean around the abscess.  Keep all follow-up appointments as directed by your caregiver. SEEK MEDICAL CARE IF:   You have increased pain, swelling, redness, fluid drainage, or bleeding.  You have muscle aches, chills, or a general ill feeling.  You have a fever. MAKE SURE YOU:   Understand these instructions.  Will watch your condition.  Will get help right away if you are not doing well or get worse.   This information is not intended to replace advice given to you by your health care provider. Make sure you discuss any questions you have with your health care provider.   Document Released: 03/08/2005 Document Revised: 11/28/2011 Document Reviewed: 08/11/2011 Elsevier Interactive Patient Education 2016 Elsevier Inc. Generalized Anxiety Disorder Generalized anxiety disorder (GAD) is a mental disorder. It interferes with life functions, including relationships, work, and school. GAD is different from normal anxiety, which everyone experiences at some point in their lives in response to specific life events and activities. Normal anxiety actually helps [name] prepare for and get through these life events and activities. Normal anxiety goes away after the event or activity is over.  GAD causes anxiety that is not necessarily related to specific events or activities. It also causes excess anxiety in proportion to specific events or activities. The anxiety associated with GAD is also difficult to control. GAD can vary from mild to severe. People with  severe GAD can have intense waves of anxiety with physical symptoms (panic attacks).  SYMPTOMS The anxiety and worry associated with GAD are difficult to control. This anxiety and worry are related to many life events and activities and also occur more days than not for 6 months or  longer. People with GAD also have three or more of the following symptoms (one or more in children):  Restlessness.   Fatigue.  Difficulty concentrating.   Irritability.  Muscle tension.  Difficulty sleeping or unsatisfying sleep. DIAGNOSIS GAD is diagnosed through an assessment by your health care provider. Your health care provider will ask you questions aboutyour mood,physical symptoms, and events in your life. Your health care provider may ask you about your medical history and use of alcohol or drugs, including prescription medicines. Your health care provider may also do a physical exam and blood tests. Certain medical conditions and the use of certain substances can cause symptoms similar to those associated with GAD. Your health care provider may refer you to a mental health specialist for further evaluation. TREATMENT The following therapies are usually used to treat GAD:   Medication. Antidepressant medication usually is prescribed for long-term daily control. Antianxiety medicines may be added in severe cases, especially when panic attacks occur.   Talk therapy (psychotherapy). Certain types of talk therapy can be helpful in treating GAD by providing support, education, and guidance. A form of talk therapy called cognitive behavioral therapy can teach you healthy ways to think about and react to daily life events and activities.  Stress managementtechniques. These include yoga, meditation, and exercise and can be very helpful when they are practiced regularly. A mental health specialist can help determine which treatment is best for you. Some people see improvement with one therapy. However, other people require a combination of therapies.   This information is not intended to replace advice given to you by your health care provider. Make sure you discuss any questions you have with your health care provider.   Document Released: 09/23/2012 Document Revised: 06/19/2014  Document Reviewed: 09/23/2012 Elsevier Interactive Patient Education [employer].

## 2015-12-06 ENCOUNTER — Ambulatory Visit: Payer: Medicaid Other | Admitting: Family Medicine

## 2015-12-10 ENCOUNTER — Ambulatory Visit: Payer: Medicaid Other | Admitting: Family Medicine

## 2015-12-13 ENCOUNTER — Telehealth: Payer: Self-pay | Admitting: Neurology

## 2015-12-13 NOTE — Telephone Encounter (Signed)
Patient is calling and would like to know when she will have her next MRI. Her last MRI was done in February and it should be 6 months out.  Please call.

## 2015-12-20 NOTE — Telephone Encounter (Signed)
Dr Ahern- please advise 

## 2015-12-21 ENCOUNTER — Other Ambulatory Visit: Payer: Self-pay | Admitting: Neurology

## 2015-12-21 DIAGNOSIS — R9082 White matter disease, unspecified: Secondary | ICD-10-CM

## 2015-12-21 DIAGNOSIS — R29898 Other symptoms and signs involving the musculoskeletal system: Secondary | ICD-10-CM

## 2015-12-21 NOTE — Telephone Encounter (Signed)
I ordered it, someone should be calling to schedule in the next week or two thanks

## 2015-12-21 NOTE — Telephone Encounter (Signed)
Called and LVM for pt. Advised per Dr Jaynee Eagles that she re-ordered MRI and she will be called to schedule. If she does not hear to schedule within the next week or two, call me back. Gave [facility] phone number.

## 2015-12-24 ENCOUNTER — Ambulatory Visit: Payer: Medicaid Other | Admitting: Family Medicine

## 2015-12-31 ENCOUNTER — Ambulatory Visit (INDEPENDENT_AMBULATORY_CARE_PROVIDER_SITE_OTHER): Payer: Medicaid Other | Admitting: Family Medicine

## 2015-12-31 VITALS — BP 146/88 | HR 92 | Temp 98.2°F | Resp 16 | Ht 63.0 in | Wt 162.0 lb

## 2015-12-31 DIAGNOSIS — E1310 Other specified diabetes mellitus with ketoacidosis without coma: Secondary | ICD-10-CM | POA: Diagnosis not present

## 2015-12-31 DIAGNOSIS — Z794 Long term (current) use of insulin: Secondary | ICD-10-CM

## 2015-12-31 DIAGNOSIS — G4730 Sleep apnea, unspecified: Secondary | ICD-10-CM | POA: Diagnosis not present

## 2015-12-31 DIAGNOSIS — E1140 Type 2 diabetes mellitus with diabetic neuropathy, unspecified: Secondary | ICD-10-CM | POA: Diagnosis not present

## 2015-12-31 LAB — LIPID PANEL
CHOL/HDL RATIO: 2.2 ratio (ref <=5.0)
CHOLESTEROL: 150 mg/dL (ref 125–200)
HDL: 68 mg/dL (ref >=46)
LDL CALC: 57 mg/dL (ref <130)
Triglycerides: 125 mg/dL (ref <150)
VLDL: 25 mg/dL (ref <30)

## 2015-12-31 LAB — TSH: TSH: 1.02 m[IU]/L

## 2015-12-31 LAB — POCT GLYCOSYLATED HEMOGLOBIN (HGB A1C): Hemoglobin A1C: 12

## 2015-12-31 MED ORDER — GLUCOSE BLOOD VI STRP: ORAL_STRIP | Status: DC

## 2015-12-31 NOTE — Patient Instructions (Signed)
Increase Levemir to 20 units bid. Continue Novolog at 10 units with each meal. I would like to send you to a diabetic specialist. Will set up sleep study. Will put in order for you to check your blood sugar 5 times a day. Bring your meter with on your next visit in 3 months.  Continue to watch carbs. Get a recommendation from therapist about an anti-depressant.  Try taking 1/2 if lisinopril with food. If it does not cause vomiting continue.

## 2015-12-31 NOTE — Progress Notes (Signed)
Patient ID: Christina Orozco, female   DOB: [birth year], 45 y.o.   MRN: [id number]   Christina Orozco, is a 45 y.o. female  [id number]  [id number]  DOB - 1971-03-23  CC:  Chief Complaint  Patient presents with  . Hypertension  . Diabetes  . Anxiety       HPI: Christina Orozco is a 45 y.o. female here to for follow-up diabetes and hypertension. On her last visit about 3 months ago her A1C was 12.3. She reports she is taking Levemir 15 units BID regularly and Novolog 10 units with each meal. Her A1C today is 12.0. She is taking amlodipine for her blood pressure, but is not taking lisinopril. She reports it causes her to vomit immediately after taking. Her other medications are recorded in her med list. She is requesting a sleep study. She has used Cpap in past but has not had an updated sleep study in 13 years. She is in need of a urine micral. She reports a diabetic eye exam in last year. She reports her last tetanus less than 5 years go.  Allergies  Allergen Reactions  . Aspirin Other (See Comments)    Not to use due to laser eye surgery  . Penicillins Hives    Has patient had a PCN reaction causing immediate rash, facial/tongue/throat swelling, SOB or lightheadedness with hypotension: yes Has patient had a PCN reaction causing severe rash involving mucus membranes or skin necrosis: no Has patient had a PCN reaction that required hospitalization given in hospital Has patient had a PCN reaction occurring within the last 10 years: no If all of the above answers are "NO", then may proceed wit   Past Medical History  Diagnosis Date  . Diabetes mellitus   . Hypertension   . Asthma   . GERD (gastroesophageal reflux disease)   . Neuropathy ([place])   . DDD (degenerative disc disease), cervical   . Diabetes mellitus ([place])   . Hypertension    Current Outpatient Prescriptions on File Prior to Visit  Medication Sig Dispense Refill  . albuterol (PROVENTIL HFA;VENTOLIN HFA) 108 (90  Base) MCG/ACT inhaler Inhale 1-2 puffs into the lungs every 6 (six) hours as needed for wheezing or shortness of breath. 1 Inhaler 1  . amLODipine (NORVASC) 10 MG tablet Take 1 tablet (10 mg total) by mouth daily. 30 tablet 5  . Blood Glucose Monitoring Suppl (ACCU-CHEK AVIVA PLUS) w/Device KIT 1 each by Does not apply route QID. 1 kit 0  . busPIRone (BUSPAR) 5 MG tablet Take 1 tablet (5 mg total) by mouth 2 (two) times daily. 60 tablet 2  . fluconazole (DIFLUCAN) 150 MG tablet Take 1 tablet (150 mg total) by mouth once. 1 tablet 0  . gabapentin (NEURONTIN) 300 MG capsule Take 2 capsules (600 mg total) by mouth 3 (three) times daily. (Patient taking differently: Take 600 mg by mouth every other day. 600 mg 3 times daily every other day) 90 capsule 11  . insulin aspart (NOVOLOG) 100 UNIT/ML FlexPen Inject 10 Units into the skin 3 (three) times daily with meals. 15 mL 11  . insulin detemir (LEVEMIR) 100 UNIT/ML injection Inject 0.15 mLs (15 Units total) into the skin 2 (two) times daily. 10 mL 3  . Insulin Pen Needle 31G X 6 MM MISC 1 each by Does not apply route 4 (four) times daily - after meals and at bedtime. 100 each 5  . Lancet Device MISC 1 each by Does not apply route 4 (four) times  daily -  before meals and at bedtime. 1 each 11  . Olopatadine HCl (PATADAY) 0.2 % SOLN Apply 1 drop to eye daily. (Patient taking differently: Apply 1 drop to eye daily as needed (allergies). ) 1 Bottle 0  . omeprazole (PRILOSEC) 40 MG capsule Take 1 capsule (40 mg total) by mouth daily. Reported on 06/08/2015 30 capsule 5  . acetaminophen-codeine (TYLENOL #3) 300-30 MG tablet Take 1 tablet by mouth every 4 (four) hours as needed for moderate pain. (Patient not taking: Reported on 12/31/2015) 30 tablet 0  . lisinopril (PRINIVIL,ZESTRIL) 20 MG tablet TAKE 1 TABLET(20 MG) BY MOUTH DAILY (Patient not taking: Reported on 12/31/2015) 90 tablet 0  . metoprolol (LOPRESSOR) 25 MG tablet Take 1 tablet (25 mg total) by mouth 2  (two) times daily. (Patient not taking: Reported on 12/31/2015) 60 tablet 0  . oxyCODONE-acetaminophen (PERCOCET) 10-325 MG tablet Take 1 tablet by mouth every 4 (four) hours as needed for pain. (Patient not taking: Reported on 12/31/2015) 30 tablet 0  . valACYclovir (VALTREX) 1000 MG tablet Take 1 tablet (1,000 mg total) by mouth 3 (three) times daily. (Patient not taking: Reported on 12/31/2015) 21 tablet 0   No current facility-administered medications on file prior to visit.   Family History  Problem Relation Age of Onset  . Heart disease    . Cancer      colon, stomach, lung  . Diabetes    . Seizures     Social History   Social History  . Marital Status: Single    Spouse Name: N/A  . Number of Children: 3  . Years of Education: 12+   Occupational History  . [employer]     Social History Main Topics  . Smoking status: Never Smoker   . Smokeless tobacco: Never Used  . Alcohol Use: No  . Drug Use: No  . Sexual Activity: Not on file   Other Topics Concern  . Not on file   Social History Narrative   Lives with daughter   Caffeine use: daily    Review of Systems: Constitutional: Negative for fever, chills, appetite change, weight loss. Positive for fatigue Skin: Negative for rashes or lesions of concern. HENT: Negative for ear pain, ear discharge.nose bleeds Eyes: Negative for pain, discharge, redness, itching and visual disturbance. Early cataracts Neck: Negative for pain, stiffness Respiratory: Negative for cough, shortness of breath,   Cardiovascular: Negative for palpitations and leg swelling.Positive for non-specific chest pain intermittently, probable related to stress.  Gastrointestinal: Negative for abdominal pain, nausea, vomiting, diarrhea, constipations. Positive for heartburn Genitourinary: Negative for dysuria, urgency, frequency, hematuria,  Musculoskeletal: Negative for back pain, joint pain, joint  swelling, and gait problem.Negative for  weakness.Positive for knee arthritis Neurological: Negative for dizziness, tremors, seizures, syncope,   light-headedness, numbness and headaches.  Hematological: Negative for easy bruising or bleeding Psychiatric/Behavioral: Positive for depression, anxiety   Objective:   Filed Vitals:   12/31/15 1331 12/31/15 1341  BP: 157/87 146/88  Pulse: 92   Temp: 98.2 F (36.8 C)   Resp: 16     Physical Exam: Constitutional: Patient appears well-developed and well-nourished. No distress. HENT: Normocephalic, atraumatic, External right and left ear normal. Oropharynx is clear and moist.  Eyes: Conjunctivae and EOM are normal. PERRLA, no scleral icterus. Neck: Normal ROM. Neck supple. No lymphadenopathy, No thyromegaly. CVS: RRR, S1/S2 +, no murmurs, no gallops, no rubs Pulmonary: Effort and breath sounds normal, no stridor, rhonchi, wheezes, rales.  Abdominal: Soft. Normoactive  BS,, no distension, tenderness, rebound or guarding.  Musculoskeletal: Normal range of motion. No edema and no tenderness.  Neuro: Alert.Normal muscle tone coordination. Non-focal Skin: Skin is warm and dry. No rash noted. Not diaphoretic. No erythema. No pallor. Psychiatric: Normal mood and affect. Behavior, judgment, thought content normal.  Lab Results  Component Value Date   WBC 6.5 11/17/2015   HGB 10.8* 11/17/2015   HCT 31.8* 11/17/2015   MCV 84.8 11/17/2015   PLT 344 11/17/2015   Lab Results  Component Value Date   CREATININE 1.09* 11/17/2015   BUN 18 11/17/2015   NA 135 11/17/2015   K 3.7 11/17/2015   CL 107 11/17/2015   CO2 20* 11/17/2015    Lab Results  Component Value Date   HGBA1C 12.0 12/31/2015   Lipid Panel  No results found for: CHOL, TRIG, HDL, CHOLHDL, VLDL, LDLCALC     Assessment and plan:   1. Uncontrolled type 2 diabetes mellitus with ketoacidosis without coma, unspecified long term insulin use status (HCC)  - HgB A1c - Lipid panel - TSH - Microalbumin, urine - Increase  Levemir to 20 units bid.  -I have recommended she try 1/2 of her lisinopril and to take with food.  2. Type 2 diabetes mellitus with diabetic neuropathy, with long-term current use of insulin (HCC)  - glucose blood (ACCU-CHEK AVIVA) test strip; Check blood sugar 5 times a day, am, before meals and at bedtime.  Dispense: 100 each; Refill: 12  3. Sleep apnea -Steep study to be arranged.   Return in about 3 months (around 04/01/2016).  The patient was given clear instructions to go to ER or return to medical center if symptoms don't improve, worsen or new problems develop. The patient verbalized understanding.    Micheline Chapman FNP  12/31/2015, 2:36 PM

## 2016-01-01 LAB — MICROALBUMIN, URINE: Microalb, Ur: 64.4 mg/dL

## 2016-01-04 ENCOUNTER — Other Ambulatory Visit: Payer: Medicaid Other

## 2016-01-26 ENCOUNTER — Encounter: Payer: Self-pay | Admitting: Neurology

## 2016-01-26 ENCOUNTER — Ambulatory Visit (INDEPENDENT_AMBULATORY_CARE_PROVIDER_SITE_OTHER): Payer: Medicaid Other | Admitting: Neurology

## 2016-01-26 VITALS — BP 181/103 | HR 85 | Ht 63.0 in | Wt 172.8 lb

## 2016-01-26 DIAGNOSIS — R51 Headache: Secondary | ICD-10-CM | POA: Diagnosis not present

## 2016-01-26 DIAGNOSIS — R200 Anesthesia of skin: Secondary | ICD-10-CM

## 2016-01-26 DIAGNOSIS — R9082 White matter disease, unspecified: Secondary | ICD-10-CM

## 2016-01-26 DIAGNOSIS — R202 Paresthesia of skin: Secondary | ICD-10-CM | POA: Diagnosis not present

## 2016-01-26 DIAGNOSIS — R930 Abnormal findings on diagnostic imaging of skull and head, not elsewhere classified: Secondary | ICD-10-CM

## 2016-01-26 DIAGNOSIS — H5461 Unqualified visual loss, right eye, normal vision left eye: Secondary | ICD-10-CM

## 2016-01-26 DIAGNOSIS — R519 Headache, unspecified: Secondary | ICD-10-CM

## 2016-01-26 DIAGNOSIS — G372 Central pontine myelinolysis: Secondary | ICD-10-CM | POA: Insufficient documentation

## 2016-01-26 DIAGNOSIS — H532 Diplopia: Secondary | ICD-10-CM

## 2016-01-26 DIAGNOSIS — R29898 Other symptoms and signs involving the musculoskeletal system: Secondary | ICD-10-CM

## 2016-01-26 NOTE — Progress Notes (Signed)
[facility]   Provider:  Dr Jaynee Eagles Referring Provider: Dorena Dew, FNP Primary Care Physician:  Dorena Dew, FNP  CC:  Arm pain  Interval history 01/26/2016: Patient here for follow up. She had right arm pain with 2 negative EMG/NCS no etiology found except bilateral CTS. MRI of the brain showed abnormal white matter disease likely due to her uncontrolled vascular risk factors but was periventricular and concerning for demyelination, LP for other causes such as MS negative but need repeat MRI to follow especially given continued symptoms. She has uncontrolled HTN today in the office 588 systolic, uncontrolled diabetes HgBA1c 12-14 and eating [employer] in the clinic exam room. She has pain in the elbow, pain in the right shoulder, she still has weakness sin the right arm. She has headaches and vision changes. Vision worsening in the right eye. She got new glasses and she still can't see. She has double vision and blurry vision.   HPI:  Christina Orozco is a 45 y.o. female here as a referral from Dr. Smith Robert for right hand numbness and paresthesias. PMHx HTN, DM, asthma, neuropathy, cervical degenerative disease s/p acdf.No falls.Numbnees and tingling in the right arm started at 6am yesterday morning and it hasn't stopped. She has numbness in the entire right arm all the way up toe shoulder. She woke up with the pain, the whole arm. At first she thought it was coming from the shoulder, now not sure.  She does have shoulder problems, has been hurting for years and she has had surgery on it. She is pulling and yanking on it at work, heat on the shoulder didn't help. She is having weakness in the hand. She is not using the hand to drive. She has significant pain 10/10. Numb and tinlging like when her feet fall asleep. She gets sharp radiating pain in the wrist and shoot upwards. Weakness in the grip.  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 Never had any numbness or tingling in the hand in the past. It is the whole hand even the palm  No numbness or weakness of legs. No headache, slurred speech, She has no history of TIA or stroke. No head injuries or falls. No anti-coagulant use.  Reviewed notes, labs and imaging from outside physicians, which showed:  Personally reviewed imaging and agree with the following:  MRI HEAD IMPRESSION:  1. No acute intracranial process. 2. Patchy T2/FLAIR hyperintense foci within the periventricular and deep white matter both cerebral 6 hemispheres, mildly advanced for patient age. These foci are somewhat nonspecific, with differential considerations including accelerated/hereditary small vessel ischemia (favored), sequela of prior trauma, hypercoagulable state, vasculitis, migraines, prior infection, or possibly underlying demyelinating disease.  MRI CERVICAL IMPRESSION:  1. No acute abnormality within the cervical spine. 2. Status post ACDF at F0-2 without complication. 3. Minimal multilevel degenerative disc disease as above. There is resultant mild bilateral foraminal narrowing at C3-4. No other significant stenosis within the cervical spine. 4. 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  She presented to the ED on 07/03/2014 and woke up with the symptoms. The paresthesias extended into the forearm.Denied weakness. No falls or inciting factors. She was discharged after negative MRI of the brain and cervical cord. Referred to neurology OP. Glucose was in the 400s. She denies falling asleep in a position that would hurt the arm or in a way that would compress the axilla or any part of the arm.  Startes she did not sleep on it at all.   Review of Systems: Patient complains of symptoms per HPI as well as the following symptoms: memory loss, confusion, numbness, headache, slurred speech, dizziness. Pertinent negatives per HPI. All others negative.  Social History    Social History  . Marital status: Single    Spouse name: N/A  . Number of children: 3  . Years of education: 12+   Occupational History  . [employer]     Social History Main Topics  . Smoking status: Never Smoker  . Smokeless tobacco: Never Used  . Alcohol use No  . Drug use: No  . Sexual activity: Not on file   Other Topics Concern  . Not on file   Social History Narrative   Lives with daughter   Caffeine use: daily    Family History  Problem Relation Age of Onset  . Heart disease    . Cancer      colon, stomach, lung  . Diabetes    . Seizures      Past Medical History:  Diagnosis Date  . Asthma   . DDD (degenerative disc disease), cervical   . Diabetes mellitus   . Diabetes mellitus ([place])   . GERD (gastroesophageal reflux disease)   . Hypertension   . Hypertension   . Neuropathy [facility])     Past Surgical History:  Procedure Laterality Date  . ADENOIDECTOMY    . APPENDECTOMY    . CERVICAL SPINE SURGERY  06/2012   C5-C6  . SHOULDER SURGERY    . TONSILLECTOMY      Current Outpatient Prescriptions  Medication Sig Dispense Refill  . acetaminophen-codeine (TYLENOL #3) 300-30 MG tablet Take 1 tablet by mouth every 4 (four) hours as needed for moderate pain. (Patient not taking: Reported on 12/31/2015) 30 tablet 0  . albuterol (PROVENTIL HFA;VENTOLIN HFA) 108 (90 Base) MCG/ACT inhaler Inhale 1-2 puffs into the lungs every 6 (six) hours as needed for wheezing or shortness of breath. 1 Inhaler 1  . amLODipine (NORVASC) 10 MG tablet Take 1 tablet (10 mg total) by mouth daily. 30 tablet 5  . Blood Glucose Monitoring Suppl (ACCU-CHEK AVIVA PLUS) w/Device KIT 1 each by Does not apply route QID. 1 kit 0  . busPIRone (BUSPAR) 5 MG tablet Take 1 tablet (5 mg total) by mouth 2 (two) times daily. 60 tablet 2  . fluconazole (DIFLUCAN) 150 MG tablet Take 1 tablet (150 mg total) by mouth once. 1 tablet 0  . gabapentin (NEURONTIN) 300 MG capsule Take 2 capsules (600  mg total) by mouth 3 (three) times daily. (Patient taking differently: Take 600 mg by mouth every other day. 600 mg 3 times daily every other day) 90 capsule 11  . glucose blood (ACCU-CHEK AVIVA) test strip Check blood sugar 5 times a day, am, before meals and at bedtime. 100 each 12  . insulin aspart (NOVOLOG) 100 UNIT/ML FlexPen Inject 10 Units into the skin 3 (three) times daily with meals. 15 mL 11  . insulin detemir (LEVEMIR) 100 UNIT/ML injection Inject 0.15 mLs (15 Units total) into the skin 2 (two) times daily. 10 mL 3  . Insulin Pen Needle 31G X 6 MM MISC 1 each by Does not apply route 4 (four) times daily - after meals and at bedtime. 100 each 5  . Lancet Device MISC 1 each by Does not apply route 4 (four) times daily -  before meals and at bedtime. 1 each 11  .  lisinopril (PRINIVIL,ZESTRIL) 20 MG tablet TAKE 1 TABLET(20 MG) BY MOUTH DAILY (Patient not taking: Reported on 12/31/2015) 90 tablet 0  . metoprolol (LOPRESSOR) 25 MG tablet Take 1 tablet (25 mg total) by mouth 2 (two) times daily. (Patient not taking: Reported on 12/31/2015) 60 tablet 0  . Olopatadine HCl (PATADAY) 0.2 % SOLN Apply 1 drop to eye daily. (Patient taking differently: Apply 1 drop to eye daily as needed (allergies). ) 1 Bottle 0  . omeprazole (PRILOSEC) 40 MG capsule Take 1 capsule (40 mg total) by mouth daily. Reported on 06/08/2015 30 capsule 5  . oxyCODONE-acetaminophen (PERCOCET) 10-325 MG tablet Take 1 tablet by mouth every 4 (four) hours as needed for pain. (Patient not taking: Reported on 12/31/2015) 30 tablet 0  . valACYclovir (VALTREX) 1000 MG tablet Take 1 tablet (1,000 mg total) by mouth 3 (three) times daily. (Patient not taking: Reported on 12/31/2015) 21 tablet 0   No current facility-administered medications for this visit.     Allergies as of 01/26/2016 - Review Complete 01/26/2016  Allergen Reaction Noted  . Aspirin Other (See Comments) 12/07/2012  . Penicillins Hives 03/07/2012    Vitals: Ht 5' 3"  (1.6 m)   Wt 172 lb 12.8 oz (78.4 kg)   LMP 12/06/2015   BMI 30.61 kg/m  Last Weight:  Wt Readings from Last 1 Encounters:  01/26/16 172 lb 12.8 oz (78.4 kg)   Last Height:   Ht Readings from Last 1 Encounters:  01/26/16 5' 3" (1.6 m)       Speech:    Speech is normal; fluent and spontaneous with normal comprehension.  Cognition:    The patient is oriented to person, place, and time;    Cranial Nerves:    The pupils are equal, round, and reactive to light. Visual fields are full to finger confrontation. Extraocular movements are intact. Trigeminal sensation is intact and the muscles of mastication are normal. The face is symmetric. The palate elevates in the midline. Hearing intact. Voice is normal. Shoulder shrug is normal. The tongue has normal motion without fasciculations.   Coordination:    Normal finger to nose and heel to shin.  Gait:   No ataxia  Motor Observation:    No asymmetry, no atrophy, and no involuntary movements noted. Tone:    Normal muscle tone.    Posture:    Posture is normal. normal erect    Strength: Right arm 3+/5 throughout with giveway. Otherwise strength is V/V in the upper and lower limbs. Decreased grip and APb on the right.      Sensation: intact pin prick, temperature and vibration distally in the feet. Decreased to all sensations in the right arm in no dermatomal or nerve pattern, whole arm.      Reflex Exam:  DTR's:    Deep tendon reflexes in the upper and lower extremities are normal bilaterally.   Toes:    The toes are downgoing bilaterally.   Clonus:    Clonus is absent.   Assessment/Plan:  45 y.o. female here as a referral from Dr. Smith Robert for right hand numbness and paresthesias. PMHx HTN, DM, asthma, neuropathy, cervical degenerative disease s/p acdf.No falls.Numbness and tingling and pain and weakness in the right arm, vision changes, headache, diplopia with white matter abnormalities in the brain. Exam with sensory  loss throughout the right arm and right weakness with giveway due to pain. Need repeat MRI brain.     Sarina Ill, MD  [facility] [phone], [place] [address]  Phone [phone] Fax [phone]  A total of 30 minutes was spent face-to-face with this patient. Over half this time was spent on counseling patient on the abnormal white matter changes in brain iagnosis and different diagnostic and therapeutic options available.

## 2016-01-26 NOTE — Patient Instructions (Signed)
Remember to drink plenty of fluid, eat healthy meals and do not skip any meals. Try to eat protein with a every meal and eat a healthy snack such as fruit or nuts in between meals. Try to keep a regular sleep-wake schedule and try to exercise daily, particularly in the form of walking, 20-30 minutes a day, if you can.   As far as diagnostic testing: MRI of the brain, lab  My clinical assistant and will answer any of your questions and relay your messages to me and also relay most of my messages to you.   Our phone number is [phone]. We also have an after hours call service for urgent matters and there is a physician on-call for urgent questions. For any emergencies you know to call 911 or go to the nearest emergency room

## 2016-01-27 ENCOUNTER — Telehealth: Payer: Self-pay | Admitting: *Deleted

## 2016-01-27 LAB — BASIC METABOLIC PANEL
BUN/Creatinine Ratio: 15 (ref 9–23)
BUN: 15 mg/dL (ref 6–24)
CALCIUM: 9.4 mg/dL (ref 8.7–10.2)
CHLORIDE: 103 mmol/L (ref 96–106)
CO2: 24 mmol/L (ref 18–29)
Creatinine, Ser: 1.03 mg/dL — ABNORMAL HIGH (ref 0.57–1.00)
GFR calc Af Amer: 77 mL/min/{1.73_m2} (ref >59)
GFR calc non Af Amer: 67 mL/min/{1.73_m2} (ref >59)
GLUCOSE: 220 mg/dL — AB (ref 65–99)
POTASSIUM: 4.7 mmol/L (ref 3.5–5.2)
Sodium: 142 mmol/L (ref 134–144)

## 2016-01-27 NOTE — Telephone Encounter (Signed)
LVM for pt about lab results per Dr Jaynee Eagles note. Gave [facility] phone number if she has further questions.

## 2016-01-27 NOTE — Telephone Encounter (Signed)
-----   Message from Melvenia Beam, MD sent at 01/27/2016 10:04 AM EDT ----- Bmp with elevated glucose, creatinine slightly elevated at 1.03 thanks

## 2016-02-08 ENCOUNTER — Other Ambulatory Visit: Payer: Self-pay | Admitting: Neurology

## 2016-02-12 ENCOUNTER — Ambulatory Visit
Admission: RE | Admit: 2016-02-12 | Discharge: 2016-02-12 | Disposition: A | Discharge status: Home / Self Care | Payer: Medicaid Other | Source: Ambulatory Visit | Attending: Neurology | Admitting: Neurology

## 2016-02-12 DIAGNOSIS — R200 Anesthesia of skin: Secondary | ICD-10-CM

## 2016-02-12 DIAGNOSIS — H5461 Unqualified visual loss, right eye, normal vision left eye: Secondary | ICD-10-CM | POA: Diagnosis not present

## 2016-02-12 DIAGNOSIS — R51 Headache: Secondary | ICD-10-CM | POA: Diagnosis not present

## 2016-02-12 DIAGNOSIS — R9082 White matter disease, unspecified: Secondary | ICD-10-CM

## 2016-02-12 DIAGNOSIS — R29898 Other symptoms and signs involving the musculoskeletal system: Secondary | ICD-10-CM

## 2016-02-12 DIAGNOSIS — R519 Headache, unspecified: Secondary | ICD-10-CM

## 2016-02-12 DIAGNOSIS — H532 Diplopia: Secondary | ICD-10-CM

## 2016-02-12 MED ORDER — GADOBENATE DIMEGLUMINE 529 MG/ML IV SOLN
20.0000 mL | Freq: Once | INTRAVENOUS | Status: AC | PRN
  Administered 2016-02-12: 16 mL via INTRAVENOUS

## 2016-02-15 ENCOUNTER — Telehealth: Payer: Self-pay | Admitting: *Deleted

## 2016-02-15 NOTE — Telephone Encounter (Signed)
Called and spoke to pt about lab results per Dr Jaynee Eagles note. Pt verbalized understanding and states she has f/u with her PCP on 03/02/16. Advised her to call if she has further questions or concerns. She verbalized understanding.

## 2016-02-15 NOTE — Telephone Encounter (Signed)
-----   Message from Melvenia Beam, MD sent at 02/13/2016 10:55 PM EDT ----- MRi of the brain is stable, no worsening, she can follow up with me yearly to follow. Not consistent with multiple sclerosis. Appears to be due to vascular risk factors such as diabetes, high blood pressure, cholesterol she needs to follow closely woth primary care to control all of these so that the white-matter abnormalities do not worsen. thanks

## 2016-02-22 ENCOUNTER — Ambulatory Visit (HOSPITAL_BASED_OUTPATIENT_CLINIC_OR_DEPARTMENT_OTHER): Payer: Medicaid Other | Attending: Family Medicine | Admitting: Internal Medicine

## 2016-02-22 VITALS — Ht 63.0 in | Wt 162.0 lb

## 2016-02-22 DIAGNOSIS — I10 Essential (primary) hypertension: Secondary | ICD-10-CM | POA: Insufficient documentation

## 2016-02-22 DIAGNOSIS — R5383 Other fatigue: Secondary | ICD-10-CM | POA: Insufficient documentation

## 2016-02-22 DIAGNOSIS — G4730 Sleep apnea, unspecified: Secondary | ICD-10-CM

## 2016-02-22 DIAGNOSIS — E119 Type 2 diabetes mellitus without complications: Secondary | ICD-10-CM | POA: Diagnosis not present

## 2016-02-22 DIAGNOSIS — I493 Ventricular premature depolarization: Secondary | ICD-10-CM | POA: Diagnosis not present

## 2016-02-22 DIAGNOSIS — R0683 Snoring: Secondary | ICD-10-CM | POA: Diagnosis not present

## 2016-02-22 DIAGNOSIS — Z794 Long term (current) use of insulin: Secondary | ICD-10-CM | POA: Diagnosis not present

## 2016-03-01 DIAGNOSIS — Z0271 Encounter for disability determination: Secondary | ICD-10-CM

## 2016-03-02 ENCOUNTER — Ambulatory Visit: Payer: Medicaid Other | Admitting: Family Medicine

## 2016-03-03 DIAGNOSIS — G4730 Sleep apnea, unspecified: Secondary | ICD-10-CM

## 2016-03-04 NOTE — Procedures (Signed)
  Patient Name: Christina Orozco, Dettmann Date: 02/22/2016 Gender: Female D.O.B: 1970/08/28 Age (years): 44 Referring Provider: Micheline Chapman Height (inches): 63 Interpreting Physician: Baird Lyons MD, ABSM Weight (lbs): 162 RPSGT: Carolin Coy BMI: 29 MRN: [id number] Neck Size: 14.00 CLINICAL INFORMATION Sleep Study Type: NPSG Indication for sleep study: Diabetes, Fatigue, Hypertension, Snoring Epworth Sleepiness Score: 7  SLEEP STUDY TECHNIQUE As per the AASM Manual for the Scoring of Sleep and Associated Events v2.3 (April 2016) with a hypopnea requiring 4% desaturations. The channels recorded and monitored were frontal, central and occipital EEG, electrooculogram (EOG), submentalis EMG (chin), nasal and oral airflow, thoracic and abdominal wall motion, anterior tibialis EMG, snore microphone, electrocardiogram, and pulse oximetry.  MEDICATIONS Patient's medications include: charted for review Medications self-administered by patient during sleep study : novolog flex pen.  SLEEP ARCHITECTURE The study was initiated at 10:28:26 PM and ended at 4:35:00 AM. Sleep onset time was 52.4 minutes and the sleep efficiency was 76.7%. The total sleep time was 281.0 minutes. Stage REM latency was 180.0 minutes. The patient spent 8.72% of the night in stage N1 sleep, 76.33% in stage N2 sleep, 0.00% in stage N3 and 14.95% in REM. Alpha intrusion was absent. Supine sleep was 0.00%.  RESPIRATORY PARAMETERS The overall apnea/hypopnea index (AHI) was 0.4 per hour. There were 2 total apneas, including 0 obstructive, 2 central and 0 mixed apneas. There were 0 hypopneas and 13 RERAs. The AHI during Stage REM sleep was 2.9 per hour. AHI while supine was N/A per hour. The mean oxygen saturation was 97.83%. The minimum SpO2 during sleep was 96.00%. Moderate snoring was noted during this study.  CARDIAC DATA The 2 lead EKG demonstrated sinus rhythm. The mean heart rate was 82.80 beats per  minute. Other EKG findings include: PVCs.  LEG MOVEMENT DATA The total PLMS were 17 with a resulting PLMS index of 3.63. Associated arousal with leg movement index was 0.6 .  IMPRESSIONS - No significant obstructive sleep apnea occurred during this study (AHI = 0.4/h). - No significant central sleep apnea occurred during this study (CAI = 0.4/h). - The patient had minimal or no oxygen desaturation during the study (Min O2 = 96.00%) - The patient snored with Moderate snoring volume. - EKG findings include PVCs. - Clinically significant periodic limb movements did not occur during sleep. No significant associated arousals.  DIAGNOSIS - Primary Snoring (786.09 [R06.83 ICD-10]) - Normal study  RECOMMENDATIONS - Avoid alcohol, sedatives and other CNS depressants that may worsen sleep apnea and disrupt normal sleep architecture. - Sleep hygiene should be reviewed to assess factors that may improve sleep quality. - Weight management and regular exercise should be initiated or continued if appropriate.  [Electronically signed] 03/04/2016 09:19 AM  Baird Lyons MD, ABSM Diplomate, American Board of Sleep Medicine   NPI: [id number]  [place], American Board of Sleep Medicine  ELECTRONICALLY SIGNED ON:  03/04/2016, 9:16 AM [place] PH: [phone]   FX: [phone] [place]

## 2016-03-23 ENCOUNTER — Ambulatory Visit (INDEPENDENT_AMBULATORY_CARE_PROVIDER_SITE_OTHER): Payer: Medicaid Other | Admitting: Family Medicine

## 2016-03-23 ENCOUNTER — Encounter: Payer: Self-pay | Admitting: Family Medicine

## 2016-03-23 ENCOUNTER — Ambulatory Visit (INDEPENDENT_AMBULATORY_CARE_PROVIDER_SITE_OTHER): Payer: Medicaid Other | Admitting: Orthopaedic Surgery

## 2016-03-23 VITALS — BP 183/86 | HR 80 | Temp 98.3°F | Resp 16 | Ht 63.0 in | Wt 168.0 lb

## 2016-03-23 DIAGNOSIS — Z23 Encounter for immunization: Secondary | ICD-10-CM

## 2016-03-23 DIAGNOSIS — E118 Type 2 diabetes mellitus with unspecified complications: Secondary | ICD-10-CM | POA: Diagnosis not present

## 2016-03-23 DIAGNOSIS — M19011 Primary osteoarthritis, right shoulder: Secondary | ICD-10-CM

## 2016-03-23 LAB — POCT GLYCOSYLATED HEMOGLOBIN (HGB A1C): Hemoglobin A1C: 11.4

## 2016-03-23 MED ORDER — HYDROCHLOROTHIAZIDE 25 MG PO TABS: 25.0000 mg | ORAL_TABLET | Freq: Every day | ORAL | 1 refills | Status: DC

## 2016-03-23 MED ORDER — SERTRALINE HCL 50 MG PO TABS: 50.0000 mg | ORAL_TABLET | Freq: Every day | ORAL | 3 refills | Status: DC

## 2016-03-23 NOTE — Patient Instructions (Addendum)
Increase Levemir to 25 units rather than 20. A1C is still too high.  Bring meter by to be reviewed in 2 weeks.

## 2016-03-27 NOTE — Progress Notes (Signed)
Christina Orozco, is a 45 y.o. female  QQP:[id number]  [id number]  DOB - 1971-05-02  CC:  Chief Complaint  Patient presents with  . Hypertension  . Diabetes  . Edema       HPI: Christina Orozco is a 45 y.o. female here for follow-up chronic conditions. She has DM2 and is on Novolog 10 tid, Levemir 20 q day. She has hypertension as is on Norvasc 10 . Her BP today is 183/86. She was on lisinopril but has stopped that.due to facial swelling She is also on omeprozole for GERD. Her A1C today is 11.4   Past Medical History:  Diagnosis Date  . Asthma   . DDD (degenerative disc disease), cervical   . Diabetes mellitus   . Diabetes mellitus ([place])   . GERD (gastroesophageal reflux disease)   . Hypertension   . Hypertension   . Neuropathy [facility])    Current Outpatient Prescriptions on File Prior to Visit  Medication Sig Dispense Refill  . albuterol (PROVENTIL HFA;VENTOLIN HFA) 108 (90 Base) MCG/ACT inhaler Inhale 1-2 puffs into the lungs every 6 (six) hours as needed for wheezing or shortness of breath. 1 Inhaler 1  . amLODipine (NORVASC) 10 MG tablet Take 1 tablet (10 mg total) by mouth daily. 30 tablet 5  . Blood Glucose Monitoring Suppl (ACCU-CHEK AVIVA PLUS) w/Device KIT 1 each by Does not apply route QID. 1 kit 0  . gabapentin (NEURONTIN) 300 MG capsule Take 2 capsules (600 mg total) by mouth 3 (three) times daily. (Patient taking differently: Take 600 mg by mouth every other day. 600 mg 3 times daily every other day) 90 capsule 11  . glucose blood (ACCU-CHEK AVIVA) test strip Check blood sugar 5 times a day, am, before meals and at bedtime. 100 each 12  . insulin aspart (NOVOLOG) 100 UNIT/ML FlexPen Inject 10 Units into the skin 3 (three) times daily with meals. 15 mL 11  . insulin detemir (LEVEMIR) 100 UNIT/ML injection Inject 0.15 mLs (15 Units total) into the skin 2 (two) times daily. 10 mL 3  . Insulin Pen Needle 31G X 6 MM MISC 1 each by Does not apply route 4 (four) times  daily - after meals and at bedtime. 100 each 5  . Lancet Device MISC 1 each by Does not apply route 4 (four) times daily -  before meals and at bedtime. 1 each 11  . Olopatadine HCl (PATADAY) 0.2 % SOLN Apply 1 drop to eye daily. (Patient taking differently: Apply 1 drop to eye daily as needed (allergies). ) 1 Bottle 0  . omeprazole (PRILOSEC) 40 MG capsule Take 1 capsule (40 mg total) by mouth daily. Reported on 06/08/2015 30 capsule 5   No current facility-administered medications on file prior to visit.    Family History  Problem Relation Age of Onset  . Heart disease    . Cancer      colon, stomach, lung  . Diabetes    . Seizures     Social History   Social History  . Marital status: Single    Spouse name: N/A  . Number of children: 3  . Years of education: 12+   Occupational History  . [employer]     Social History Main Topics  . Smoking status: Never Smoker  . Smokeless tobacco: Never Used  . Alcohol use No  . Drug use: No  . Sexual activity: Not on file   Other Topics Concern  . Not on file  Social History Narrative   Lives with daughter   Caffeine use: daily    Review of Systems: Constitutional: + for feeling hot a lot. Skin: Negative HENT: Negative  Eyes: Negative  Neck: Negative Respiratory: Negative Cardiovascular: + for pedal edema Gastrointestinal: + constipation and heartburn Genitourinary: Negative  Musculoskeletal: + for back,and shoulder pain Neurological: + for history of white matter changes Hematological: Negative  Psychiatric/Behavioral: Negative    Objective:   Vitals:   03/23/16 1424  BP: (!) 183/86  Pulse: 80  Resp: 16  Temp: 98.3 F (36.8 C)    Physical Exam: Constitutional: Patient appears well-developed and well-nourished. No distress. HENT: Normocephalic, atraumatic, External right and left ear normal. Oropharynx is clear and moist.  Eyes: Conjunctivae and EOM are normal. PERRLA, no scleral icterus. Neck:  Normal ROM. Neck supple. No lymphadenopathy, No thyromegaly. CVS: RRR, S1/S2 +, no murmurs, no gallops, no rubs Pulmonary: Effort and breath sounds normal, no stridor, rhonchi, wheezes, rales.  Abdominal: Soft. Normoactive BS,, no distension, tenderness, rebound or guarding.  Musculoskeletal: Normal range of motion. No edema and no tenderness.  Neuro: Alert.Normal muscle tone coordination. Non-focal Skin: Skin is warm and dry. No rash noted. Not diaphoretic. No erythema. No pallor. Psychiatric: Normal mood and affect. Behavior, judgment, thought content normal.  Lab Results  Component Value Date   WBC 6.5 11/17/2015   HGB 10.8 (L) 11/17/2015   HCT 31.8 (L) 11/17/2015   MCV 84.8 11/17/2015   PLT 344 11/17/2015   Lab Results  Component Value Date   CREATININE 1.03 (H) 01/26/2016   BUN 15 01/26/2016   NA 142 01/26/2016   K 4.7 01/26/2016   CL 103 01/26/2016   CO2 24 01/26/2016    Lab Results  Component Value Date   HGBA1C 11.4 03/23/2016   Lipid Panel     Component Value Date/Time   CHOL 150 12/31/2015 1416   TRIG 125 12/31/2015 1416   HDL 68 12/31/2015 1416   CHOLHDL 2.2 12/31/2015 1416   VLDL 25 12/31/2015 1416   LDLCALC 57 12/31/2015 1416       Assessment and plan:   1. Type 2 diabetes mellitus with complication, unspecified long term insulin use status (HCC)  - Flu Vaccine QUAD 36+ mos PF IM (Fluarix & Fluzone Quad PF) - HgB A1c - Increase Levemir to 25 units daily.  2. Hypertension not controlled by amlodipine -Add hctz 25, recheck when returns in 2 weeks.   Return in about 2 weeks (around 04/06/2016) for for meter review.  The patient was given clear instructions to go to ER or return to medical center if symptoms don't improve, worsen or new problems develop. The patient verbalized understanding.    Micheline Chapman FNP  03/27/2016, 12:21 PM

## 2016-03-28 ENCOUNTER — Encounter (INDEPENDENT_AMBULATORY_CARE_PROVIDER_SITE_OTHER): Payer: Self-pay

## 2016-04-03 ENCOUNTER — Ambulatory Visit (INDEPENDENT_AMBULATORY_CARE_PROVIDER_SITE_OTHER): Payer: Medicaid Other | Admitting: Physical Medicine and Rehabilitation

## 2016-04-03 ENCOUNTER — Encounter (INDEPENDENT_AMBULATORY_CARE_PROVIDER_SITE_OTHER): Payer: Self-pay | Admitting: Physical Medicine and Rehabilitation

## 2016-04-03 VITALS — BP 149/98

## 2016-04-03 DIAGNOSIS — G8929 Other chronic pain: Secondary | ICD-10-CM

## 2016-04-03 DIAGNOSIS — M25511 Pain in right shoulder: Secondary | ICD-10-CM | POA: Diagnosis not present

## 2016-04-03 MED ORDER — TRIAMCINOLONE ACETONIDE 40 MG/ML IJ SUSP
40.0000 mg | INTRAMUSCULAR | Status: AC | PRN
  Administered 2016-04-03: 40 mg via INTRA_ARTICULAR

## 2016-04-03 MED ORDER — LIDOCAINE HCL 2 % IJ SOLN
4.0000 mL | INTRAMUSCULAR | Status: AC | PRN
  Administered 2016-04-03: 4 mL

## 2016-04-03 NOTE — Progress Notes (Signed)
   Procedure Note  Patient: Christina Orozco             Date of Birth: Oct 14, 1970           MRN: [id number]             Visit Date: 04/03/2016  Procedures: Visit Diagnoses: Chronic right shoulder pain - Plan: Large Joint Injection/Arthrocentesis   Right shoulder pain x several yrs. Worse recently. Constant pain. Cant do any overhead activity. Radiates down arm to hand.   Large Joint Inj Date/Time: 04/03/2016 4:56 PM Performed by: Magnus Sinning Authorized by: Magnus Sinning   Consent Given by:  Patient Site marked: the procedure site was marked   Timeout: prior to procedure the correct patient, procedure, and site was verified   Indications:  Pain Location:  Shoulder Site:  R glenohumeral Needle Size:  22 G Needle Length:  3.5 inches Approach:  Anterior Ultrasound Guidance: No   Fluoroscopic Guidance: No   Arthrogram: yes Medications:  4 mL lidocaine 2 %; 40 mg triamcinolone acetonide 40 MG/ML Aspiration Attempted: No   Patient tolerance:  Patient tolerated the procedure well with no immediate complications Comments: Arthrogram demonstrated excellent flow of contrast throughout the joint surface without extravasation or obvious defect.  The patient had relief of symptoms during the anesthetic phase of the injection.

## 2016-04-30 ENCOUNTER — Encounter (HOSPITAL_COMMUNITY): Payer: Self-pay

## 2016-04-30 ENCOUNTER — Emergency Department (HOSPITAL_COMMUNITY): Payer: Medicaid Other

## 2016-04-30 ENCOUNTER — Inpatient Hospital Stay (HOSPITAL_COMMUNITY): Payer: Medicaid Other

## 2016-04-30 ENCOUNTER — Inpatient Hospital Stay (HOSPITAL_COMMUNITY)
Admission: EM | Admit: 2016-04-30 | Discharge: 2016-05-03 | DRG: 058 | Disposition: A | Discharge status: Home Health | Payer: Medicaid Other | Attending: Internal Medicine | Admitting: Internal Medicine

## 2016-04-30 DIAGNOSIS — E1101 Type 2 diabetes mellitus with hyperosmolarity with coma: Secondary | ICD-10-CM

## 2016-04-30 DIAGNOSIS — G372 Central pontine myelinolysis: Principal | ICD-10-CM | POA: Diagnosis present

## 2016-04-30 DIAGNOSIS — R471 Dysarthria and anarthria: Secondary | ICD-10-CM | POA: Diagnosis present

## 2016-04-30 DIAGNOSIS — R270 Ataxia, unspecified: Secondary | ICD-10-CM | POA: Diagnosis not present

## 2016-04-30 DIAGNOSIS — E1142 Type 2 diabetes mellitus with diabetic polyneuropathy: Secondary | ICD-10-CM | POA: Diagnosis present

## 2016-04-30 DIAGNOSIS — K219 Gastro-esophageal reflux disease without esophagitis: Secondary | ICD-10-CM | POA: Diagnosis present

## 2016-04-30 DIAGNOSIS — I10 Essential (primary) hypertension: Secondary | ICD-10-CM | POA: Diagnosis not present

## 2016-04-30 DIAGNOSIS — N183 Chronic kidney disease, stage 3 unspecified: Secondary | ICD-10-CM | POA: Diagnosis present

## 2016-04-30 DIAGNOSIS — R269 Unspecified abnormalities of gait and mobility: Secondary | ICD-10-CM

## 2016-04-30 DIAGNOSIS — R2681 Unsteadiness on feet: Secondary | ICD-10-CM

## 2016-04-30 DIAGNOSIS — R739 Hyperglycemia, unspecified: Secondary | ICD-10-CM | POA: Diagnosis present

## 2016-04-30 DIAGNOSIS — R4781 Slurred speech: Secondary | ICD-10-CM

## 2016-04-30 DIAGNOSIS — E1100 Type 2 diabetes mellitus with hyperosmolarity without nonketotic hyperglycemic-hyperosmolar coma (NKHHC): Secondary | ICD-10-CM | POA: Diagnosis present

## 2016-04-30 DIAGNOSIS — G939 Disorder of brain, unspecified: Secondary | ICD-10-CM

## 2016-04-30 DIAGNOSIS — I129 Hypertensive chronic kidney disease with stage 1 through stage 4 chronic kidney disease, or unspecified chronic kidney disease: Secondary | ICD-10-CM | POA: Diagnosis present

## 2016-04-30 DIAGNOSIS — R42 Dizziness and giddiness: Secondary | ICD-10-CM | POA: Insufficient documentation

## 2016-04-30 DIAGNOSIS — Z833 Family history of diabetes mellitus: Secondary | ICD-10-CM

## 2016-04-30 DIAGNOSIS — I119 Hypertensive heart disease without heart failure: Secondary | ICD-10-CM | POA: Diagnosis present

## 2016-04-30 DIAGNOSIS — F482 Pseudobulbar affect: Secondary | ICD-10-CM | POA: Diagnosis present

## 2016-04-30 DIAGNOSIS — Z880 Allergy status to penicillin: Secondary | ICD-10-CM

## 2016-04-30 DIAGNOSIS — Z888 Allergy status to other drugs, medicaments and biological substances status: Secondary | ICD-10-CM

## 2016-04-30 DIAGNOSIS — Z886 Allergy status to analgesic agent status: Secondary | ICD-10-CM

## 2016-04-30 LAB — COMPREHENSIVE METABOLIC PANEL
ALBUMIN: 3.9 g/dL (ref 3.5–5.0)
ALT: 18 U/L (ref 14–54)
AST: 19 U/L (ref 15–41)
Alkaline Phosphatase: 64 U/L (ref 38–126)
Anion gap: 10 (ref 5–15)
BUN: 23 mg/dL — AB (ref 6–20)
CHLORIDE: 96 mmol/L — AB (ref 101–111)
CO2: 23 mmol/L (ref 22–32)
Calcium: 9.7 mg/dL (ref 8.9–10.3)
Creatinine, Ser: 1.52 mg/dL — ABNORMAL HIGH (ref 0.44–1.00)
GFR calc Af Amer: 48 mL/min — ABNORMAL LOW (ref >60)
GFR calc non Af Amer: 41 mL/min — ABNORMAL LOW (ref >60)
GLUCOSE: 597 mg/dL — AB (ref 65–99)
POTASSIUM: 4.1 mmol/L (ref 3.5–5.1)
SODIUM: 129 mmol/L — AB (ref 135–145)
Total Bilirubin: 0.5 mg/dL (ref 0.3–1.2)
Total Protein: 7.8 g/dL (ref 6.5–8.1)

## 2016-04-30 LAB — URINALYSIS, ROUTINE W REFLEX MICROSCOPIC
Bilirubin Urine: NEGATIVE
Glucose, UA: >1000 mg/dL — AB
Hgb urine dipstick: NEGATIVE
Ketones, ur: NEGATIVE mg/dL
LEUKOCYTES UA: NEGATIVE
NITRITE: NEGATIVE
PH: 6 (ref 5.0–8.0)
Protein, ur: NEGATIVE mg/dL
SPECIFIC GRAVITY, URINE: 1.031 — AB (ref 1.005–1.030)

## 2016-04-30 LAB — I-STAT TROPONIN, ED: Troponin i, poc: 0 ng/mL (ref 0.00–0.08)

## 2016-04-30 LAB — DIFFERENTIAL
BASOS ABS: 0 10*3/uL (ref 0.0–0.1)
BASOS PCT: 1 %
EOS ABS: 0.1 10*3/uL (ref 0.0–0.7)
Eosinophils Relative: 2 %
LYMPHS ABS: 2.3 10*3/uL (ref 0.7–4.0)
Lymphocytes Relative: 48 %
Monocytes Absolute: 0.2 10*3/uL (ref 0.1–1.0)
Monocytes Relative: 5 %
NEUTROS PCT: 44 %
Neutro Abs: 2.1 10*3/uL (ref 1.7–7.7)

## 2016-04-30 LAB — PROTIME-INR
INR: 0.96
PROTHROMBIN TIME: 12.8 s (ref 11.4–15.2)

## 2016-04-30 LAB — URINE MICROSCOPIC-ADD ON
Bacteria, UA: NONE SEEN
RBC / HPF: NONE SEEN RBC/hpf (ref 0–5)
WBC UA: NONE SEEN WBC/hpf (ref 0–5)

## 2016-04-30 LAB — CBC
HCT: 34.6 % — ABNORMAL LOW (ref 36.0–46.0)
HEMOGLOBIN: 12 g/dL (ref 12.0–15.0)
MCH: 28.2 pg (ref 26.0–34.0)
MCHC: 34.7 g/dL (ref 30.0–36.0)
MCV: 81.4 fL (ref 78.0–100.0)
PLATELETS: 318 10*3/uL (ref 150–400)
RBC: 4.25 MIL/uL (ref 3.87–5.11)
RDW: 12.8 % (ref 11.5–15.5)
WBC: 4.7 10*3/uL (ref 4.0–10.5)

## 2016-04-30 LAB — BASIC METABOLIC PANEL
ANION GAP: 9 (ref 5–15)
BUN: 18 mg/dL (ref 6–20)
CHLORIDE: 104 mmol/L (ref 101–111)
CO2: 22 mmol/L (ref 22–32)
Calcium: 9.2 mg/dL (ref 8.9–10.3)
Creatinine, Ser: 1.25 mg/dL — ABNORMAL HIGH (ref 0.44–1.00)
GFR, EST NON AFRICAN AMERICAN: 52 mL/min — AB (ref >60)
GLUCOSE: 236 mg/dL — AB (ref 65–99)
POTASSIUM: 3.3 mmol/L — AB (ref 3.5–5.1)
Sodium: 135 mmol/L (ref 135–145)

## 2016-04-30 LAB — CBG MONITORING, ED
GLUCOSE-CAPILLARY: 510 mg/dL — AB (ref 65–99)
Glucose-Capillary: 238 mg/dL — ABNORMAL HIGH (ref 65–99)
Glucose-Capillary: 187 mg/dL — ABNORMAL HIGH (ref 65–99)

## 2016-04-30 LAB — APTT: APTT: 25 s (ref 24–36)

## 2016-04-30 LAB — GLUCOSE, CAPILLARY: GLUCOSE-CAPILLARY: 218 mg/dL — AB (ref 65–99)

## 2016-04-30 MED ORDER — MECLIZINE HCL 25 MG PO TABS: 25.0000 mg | ORAL_TABLET | Freq: Three times a day (TID) | ORAL | Status: DC | PRN

## 2016-04-30 MED ORDER — ACETAMINOPHEN 325 MG PO TABS
650.0000 mg | ORAL_TABLET | Freq: Four times a day (QID) | ORAL | Status: DC | PRN
  Administered 2016-05-01 – 2016-05-02 (×2): 650 mg via ORAL
  Filled 2016-04-30 (×2): qty 2

## 2016-04-30 MED ORDER — SODIUM CHLORIDE 0.9 % IV SOLN
INTRAVENOUS | Status: DC
  Administered 2016-04-30: 1.8 [IU]/h via INTRAVENOUS
  Administered 2016-04-30: 4.5 [IU]/h via INTRAVENOUS
  Filled 2016-04-30: qty 2.5

## 2016-04-30 MED ORDER — OLOPATADINE HCL 0.1 % OP SOLN
1.0000 [drp] | Freq: Two times a day (BID) | OPHTHALMIC | Status: DC
  Filled 2016-04-30: qty 5

## 2016-04-30 MED ORDER — HYDROCHLOROTHIAZIDE 25 MG PO TABS
25.0000 mg | ORAL_TABLET | Freq: Every day | ORAL | Status: DC
  Administered 2016-05-01 – 2016-05-03 (×3): 25 mg via ORAL
  Filled 2016-04-30 (×3): qty 1

## 2016-04-30 MED ORDER — POTASSIUM CHLORIDE CRYS ER 20 MEQ PO TBCR
40.0000 meq | EXTENDED_RELEASE_TABLET | Freq: Once | ORAL | Status: AC
  Administered 2016-04-30: 40 meq via ORAL
  Filled 2016-04-30: qty 2

## 2016-04-30 MED ORDER — INSULIN ASPART 100 UNIT/ML ~~LOC~~ SOLN
10.0000 [IU] | Freq: Three times a day (TID) | SUBCUTANEOUS | Status: DC
  Administered 2016-05-01 – 2016-05-02 (×5): 10 [IU] via SUBCUTANEOUS

## 2016-04-30 MED ORDER — ENOXAPARIN SODIUM 40 MG/0.4ML ~~LOC~~ SOLN: 40.0000 mg | SUBCUTANEOUS | Status: DC

## 2016-04-30 MED ORDER — INSULIN DETEMIR 100 UNIT/ML ~~LOC~~ SOLN
20.0000 [IU] | Freq: Two times a day (BID) | SUBCUTANEOUS | Status: DC
  Administered 2016-04-30 – 2016-05-01 (×2): 20 [IU] via SUBCUTANEOUS
  Filled 2016-04-30 (×4): qty 0.2

## 2016-04-30 MED ORDER — ENOXAPARIN SODIUM 40 MG/0.4ML ~~LOC~~ SOLN
40.0000 mg | SUBCUTANEOUS | Status: DC
  Administered 2016-04-30 – 2016-05-01 (×2): 40 mg via SUBCUTANEOUS
  Filled 2016-04-30 (×2): qty 0.4

## 2016-04-30 MED ORDER — SERTRALINE HCL 50 MG PO TABS
50.0000 mg | ORAL_TABLET | Freq: Every day | ORAL | Status: DC
  Administered 2016-05-01: 50 mg via ORAL
  Filled 2016-04-30 (×2): qty 1

## 2016-04-30 MED ORDER — PANTOPRAZOLE SODIUM 40 MG PO TBEC
40.0000 mg | DELAYED_RELEASE_TABLET | Freq: Every day | ORAL | Status: DC
  Administered 2016-05-01 – 2016-05-03 (×3): 40 mg via ORAL
  Filled 2016-04-30 (×4): qty 1

## 2016-04-30 MED ORDER — ONDANSETRON HCL 4 MG/2ML IJ SOLN
4.0000 mg | Freq: Four times a day (QID) | INTRAMUSCULAR | Status: DC | PRN
  Filled 2016-04-30: qty 2

## 2016-04-30 MED ORDER — INSULIN ASPART 100 UNIT/ML ~~LOC~~ SOLN
0.0000 [IU] | Freq: Three times a day (TID) | SUBCUTANEOUS | Status: DC
  Administered 2016-05-01: 3 [IU] via SUBCUTANEOUS
  Administered 2016-05-01: 2 [IU] via SUBCUTANEOUS
  Administered 2016-05-03: 8 [IU] via SUBCUTANEOUS

## 2016-04-30 MED ORDER — AMLODIPINE BESYLATE 5 MG PO TABS
10.0000 mg | ORAL_TABLET | ORAL | Status: DC
  Administered 2016-04-30 – 2016-05-02 (×3): 10 mg via ORAL
  Filled 2016-04-30 (×3): qty 2

## 2016-04-30 MED ORDER — ACETAMINOPHEN 650 MG RE SUPP: 650.0000 mg | Freq: Four times a day (QID) | RECTAL | Status: DC | PRN

## 2016-04-30 MED ORDER — SODIUM CHLORIDE 0.9 % IV BOLUS (SEPSIS)
1000.0000 mL | Freq: Once | INTRAVENOUS | Status: AC
  Administered 2016-04-30: 1000 mL via INTRAVENOUS

## 2016-04-30 MED ORDER — GABAPENTIN 300 MG PO CAPS: 600.0000 mg | ORAL_CAPSULE | ORAL | Status: DC

## 2016-04-30 MED ORDER — ONDANSETRON HCL 4 MG PO TABS: 4.0000 mg | ORAL_TABLET | Freq: Four times a day (QID) | ORAL | Status: DC | PRN

## 2016-04-30 MED ORDER — TIZANIDINE HCL 4 MG PO TABS
4.0000 mg | ORAL_TABLET | Freq: Two times a day (BID) | ORAL | Status: DC
  Administered 2016-04-30 – 2016-05-01 (×3): 4 mg via ORAL
  Filled 2016-04-30 (×4): qty 1

## 2016-04-30 MED ORDER — INSULIN ASPART 100 UNIT/ML ~~LOC~~ SOLN
8.0000 [IU] | Freq: Once | SUBCUTANEOUS | Status: AC
  Administered 2016-04-30: 8 [IU] via INTRAVENOUS
  Filled 2016-04-30: qty 1

## 2016-04-30 MED ORDER — ALBUTEROL SULFATE (2.5 MG/3ML) 0.083% IN NEBU: 3.0000 mL | INHALATION_SOLUTION | Freq: Four times a day (QID) | RESPIRATORY_TRACT | Status: DC | PRN

## 2016-04-30 MED ORDER — LISINOPRIL 20 MG PO TABS
20.0000 mg | ORAL_TABLET | Freq: Every day | ORAL | Status: DC
  Filled 2016-04-30: qty 1

## 2016-04-30 MED ORDER — SODIUM CHLORIDE 0.9% FLUSH
3.0000 mL | Freq: Two times a day (BID) | INTRAVENOUS | Status: DC
  Administered 2016-04-30 – 2016-05-03 (×6): 3 mL via INTRAVENOUS

## 2016-04-30 MED ORDER — INSULIN ASPART 100 UNIT/ML ~~LOC~~ SOLN
0.0000 [IU] | Freq: Every day | SUBCUTANEOUS | Status: DC
  Administered 2016-04-30: 2 [IU] via SUBCUTANEOUS

## 2016-04-30 MED ORDER — GADOBENATE DIMEGLUMINE 529 MG/ML IV SOLN
15.0000 mL | Freq: Once | INTRAVENOUS | Status: AC | PRN
  Administered 2016-04-30: 15 mL via INTRAVENOUS

## 2016-04-30 NOTE — ED Notes (Signed)
CBG resulted: 187. RN notified.

## 2016-04-30 NOTE — ED Notes (Signed)
ED Provider at bedside. 

## 2016-04-30 NOTE — H&P (Signed)
History and Physical  Christina Orozco WUG:[id number] DOB: 15-May-1971 DOA: 04/30/2016  Referring physician: Dr Laverta Baltimore, ED physician PCP: Dorena Dew, FNP  Outpatient Specialists: none  Chief Complaint: dizziness  HPI: Christina Orozco is a 45 y.o. female with a history of DM2, GERD, HTN, neuropathy. Patient presented to the emergency Department for evaluation of gait instability has been worsening over the past week with slurred speech and feeling intoxicated. The symptoms have been worsening and have been occurring fairly frequently. Today she was driving her car in the opposite lane. She relates that her blood sugars been running high at home - he denies nausea and vomiting but she's been complaining of feeling very thirsty. As a result of her thirst, she's been drinking multiple sodas over the past 48 hours. She does report compliance with her insulin. She denies symptoms of illness: Fevers, chills, abdominal pain, cough.   Emergency Department Course: Dr. Laverta Baltimore consulted neurology recommended MRI as follow-up for her ataxia and dizziness - she does have a history of an MRI in September 2017, which showed white matter changes in the pons, right thalamus and in both hemispheres. It was noted that her blood sugar on arrival was 597. The patient was started on insulin drip.   Review of Systems:    Pt denies any fevers, chills, nausea, vomiting, diarrhea, constipation, abdominal pain, shortness of breath, dyspnea on exertion, orthopnea, cough, wheezing, palpitations, headache, vision changes, lightheadedness, dizziness, melena, rectal bleeding.  Review of systems are otherwise negative  Past Medical History:  Diagnosis Date  . Asthma   . DDD (degenerative disc disease), cervical   . Diabetes mellitus   . Diabetes mellitus ([place])   . GERD (gastroesophageal reflux disease)   . Hypertension   . Hypertension   . Neuropathy [facility])    Past Surgical History:  Procedure Laterality Date  .  ADENOIDECTOMY    . APPENDECTOMY    . CERVICAL SPINE SURGERY  06/2012   C5-C6  . SHOULDER SURGERY    . TONSILLECTOMY     Social History:  reports that she has never smoked. She has never used smokeless tobacco. She reports that she does not drink alcohol or use drugs. Patient lives at [place]  . Aspirin Other (See Comments)    Not to use due to laser eye surgery  . Lisinopril Swelling and Other (See Comments)    Facial swelling   . Penicillins Hives    Has patient had a PCN reaction causing immediate rash, facial/tongue/throat swelling, SOB or lightheadedness with hypotension: [place] Has patient had a PCN reaction causing severe rash involving mucus membranes or skin necrosis: Nono Has patient had a PCN reaction that required hospitalization [place] in hospital Has patient had a PCN reaction occurring within the last 10 years: Nono If all of the above answers are "NO", then may proceed wit    Family History  Problem Relation Age of Onset  . Heart disease    . Cancer      colon, stomach, lung  . Diabetes    . Seizures       Prior to Admission medications   Medication Sig Start Date End Date Taking? Authorizing Provider  acetaminophen-codeine (TYLENOL #3) 300-30 MG tablet Take 1 tablet by mouth every 4 (four) hours as needed for pain. 03/23/16  Yes Historical Provider, MD  amLODipine (NORVASC) 10 MG tablet Take 1 tablet (10 mg total) by mouth daily. 06/08/15  Yes Dorena Dew, FNP  hydrochlorothiazide (HYDRODIURIL)  25 MG tablet Take 1 tablet (25 mg total) by mouth daily. 03/23/16  Yes Micheline Chapman, NP  insulin detemir (LEVEMIR) 100 UNIT/ML injection Inject 0.15 mLs (15 Units total) into the skin 2 (two) times daily. 07/06/15  Yes Dorena Dew, FNP  lisinopril (PRINIVIL,ZESTRIL) 20 MG tablet Take 20 mg by mouth daily.   Yes Historical Provider, MD  meclizine (ANTIVERT) 25 MG tablet Take 25 mg by mouth 3 (three) times daily as needed for  dizziness.   Yes Historical Provider, MD  omeprazole (PRILOSEC) 40 MG capsule Take 1 capsule (40 mg total) by mouth daily. Reported on 06/08/2015 06/08/15  Yes Dorena Dew, FNP  albuterol (PROVENTIL HFA;VENTOLIN HFA) 108 (90 Base) MCG/ACT inhaler Inhale 1-2 puffs into the lungs every 6 (six) hours as needed for wheezing or shortness of breath. 07/13/15   Dorena Dew, FNP  Blood Glucose Monitoring Suppl (ACCU-CHEK AVIVA PLUS) w/Device KIT 1 each by Does not apply route QID. Patient not taking: Reported on 04/30/2016 11/16/15   Dorena Dew, FNP  gabapentin (NEURONTIN) 300 MG capsule Take 2 capsules (600 mg total) by mouth 3 (three) times daily. Patient taking differently: Take 600 mg by mouth every other day. 600 mg 3 times daily every other day 07/05/15   Melvenia Beam, MD  glucose blood (ACCU-CHEK AVIVA) test strip Check blood sugar 5 times a day, am, before meals and at bedtime. Patient not taking: Reported on 04/30/2016 12/31/15   Micheline Chapman, NP  insulin aspart (NOVOLOG) 100 UNIT/ML FlexPen Inject 10 Units into the skin 3 (three) times daily with meals. 07/08/15   Dorena Dew, FNP  Insulin Pen Needle 31G X 6 MM MISC 1 each by Does not apply route 4 (four) times daily - after meals and at bedtime. Patient not taking: Reported on 04/30/2016 07/08/15   Dorena Dew, FNP  Lancet Device MISC 1 each by Does not apply route 4 (four) times daily -  before meals and at bedtime. Patient not taking: Reported on 04/30/2016 05/05/15   Dorena Dew, FNP  Olopatadine HCl (PATADAY) 0.2 % SOLN Apply 1 drop to eye daily. Patient taking differently: Apply 1 drop to eye daily as needed (allergies).  08/25/15   Dorena Dew, FNP  sertraline (ZOLOFT) 50 MG tablet Take 1 tablet (50 mg total) by mouth daily. 03/23/16   Micheline Chapman, NP  tiZANidine (ZANAFLEX) 4 MG tablet Take 4 mg by mouth 2 (two) times daily.    Historical Provider, MD    Physical Exam: BP 143/92   Pulse 81    Temp 98.2 F (36.8 C)   Resp 16   Ht _0  (1.6 m)   Wt 74.8 kg (165 lb)   LMP 04/12/2016   SpO2 100%   BMI 29.23 kg/m   General: Middle-age female. Awake and alert and oriented x3. No acute cardiopulmonary distress.  HEENT: Normocephalic atraumatic.  Right and left ears normal in appearance.  Pupils equal, round, reactive to light. Extraocular muscles are intact. Sclerae anicteric and noninjected.  Moist mucosal membranes. No mucosal lesions.  Neck: Neck supple without lymphadenopathy. No carotid bruits. No masses palpated.  Cardiovascular: Regular rate with normal S1-S2 sounds. No murmurs, rubs, gallops auscultated. No JVD.  Respiratory: Good respiratory effort with no wheezes, rales, rhonchi. Lungs clear to auscultation bilaterally.  No accessory muscle use. Abdomen: Soft, nontender, nondistended. Active bowel sounds. No masses or hepatosplenomegaly  Skin: No rashes, lesions, or ulcerations.  Dry,  warm to touch. 2+ dorsalis pedis and radial pulses. Musculoskeletal: No calf or leg pain. All major joints not erythematous nontender.  No upper or lower joint deformation.  Good ROM.  No contractures  Psychiatric: Intact judgment and insight. Pleasant and cooperative. Neurologic: Strength 5 out of 5 in upper and lower extremities. She does exhibit pass pointing with left nose to finger. Cranial nerves II through XII are grossly intact.           Labs on Admission: I have personally reviewed following labs and imaging studies  CBC:  Recent Labs Lab 04/30/16 1145  WBC 4.7  NEUTROABS 2.1  HGB 12.0  HCT 34.6*  MCV 81.4  PLT 893   Basic Metabolic Panel:  Recent Labs Lab 04/30/16 1145  NA 129*  K 4.1  CL 96*  CO2 23  GLUCOSE 597*  BUN 23*  CREATININE 1.52*  CALCIUM 9.7   GFR: Estimated Creatinine Clearance: 46.7 mL/min (by C-G formula based on SCr of 1.52 mg/dL (H)). Liver Function Tests:  Recent Labs Lab 04/30/16 1145  AST 19  ALT 18  ALKPHOS 64  BILITOT 0.5    PROT 7.8  ALBUMIN 3.9   No results for input(s): LIPASE, AMYLASE in the last 168 hours. No results for input(s): AMMONIA in the last 168 hours. Coagulation Profile:  Recent Labs Lab 04/30/16 1145  INR 0.96   Cardiac Enzymes: No results for input(s): CKTOTAL, CKMB, CKMBINDEX, TROPONINI in the last 168 hours. BNP (last 3 results) No results for input(s): PROBNP in the last 8760 hours. HbA1C: No results for input(s): HGBA1C in the last 72 hours. CBG:  Recent Labs Lab 04/30/16 1435 04/30/16 1541  GLUCAP 510* 238*   Lipid Profile: No results for input(s): CHOL, HDL, LDLCALC, TRIG, CHOLHDL, LDLDIRECT in the last 72 hours. Thyroid Function Tests: No results for input(s): TSH, T4TOTAL, FREET4, T3FREE, THYROIDAB in the last 72 hours. Anemia Panel: No results for input(s): VITAMINB12, FOLATE, FERRITIN, TIBC, IRON, RETICCTPCT in the last 72 hours. Urine analysis:    Component Value Date/Time   COLORURINE YELLOW 04/30/2016 1435   APPEARANCEUR CLEAR 04/30/2016 1435   LABSPEC 1.031 (H) 04/30/2016 1435   PHURINE 6.0 04/30/2016 1435   GLUCOSEU >1000 (A) 04/30/2016 1435   HGBUR NEGATIVE 04/30/2016 1435   BILIRUBINUR NEGATIVE 04/30/2016 1435   KETONESUR NEGATIVE 04/30/2016 1435   PROTEINUR NEGATIVE 04/30/2016 1435   UROBILINOGEN 0.2 11/16/2015 1412   NITRITE NEGATIVE 04/30/2016 1435   LEUKOCYTESUR NEGATIVE 04/30/2016 1435   Sepsis Labs: _0 (procalcitonin:4,lacticidven:4) )No results found for this or any previous visit (from the past 240 hour(s)).   Radiological Exams on Admission: Ct Head Wo Contrast  Result Date: 04/30/2016 CLINICAL DATA:  45 year old female with dizziness and slurred speech. EXAM: CT HEAD WITHOUT CONTRAST TECHNIQUE: Contiguous axial images were obtained from the base of the skull through the vertex without intravenous contrast. COMPARISON:  02/12/2016 MR and 12/22/2003 CT FINDINGS: Brain: No evidence of acute infarction, hemorrhage, hydrocephalus,  extra-axial collection or mass lesion/mass effect. Mild white matter hypodensities are unchanged. Vascular: No hyperdense vessel or unexpected calcification. Skull: Normal. Negative for fracture or focal lesion. Sinuses/Orbits: No acute finding. Other: None. IMPRESSION: No evidence of acute intracranial abnormality. Unchanged mild white matter disease. Electronically Signed   By: Margarette Canada M.D.   On: 04/30/2016 12:47    EKG: Independently reviewed. Normal sinus rhythm.  Assessment/Plan: Principal Problem:   Type 2 diabetes mellitus with hyperosmolar nonketotic hyperglycemia (HCC) Active Problems:   Essential hypertension  White matter abnormality on MRI of brain   Dizziness   Hyperglycemia   Chronic kidney disease (CKD), stage III (moderate)    This patient was discussed with the ED physician, including pertinent vitals, physical exam findings, labs, and imaging.  We also discussed care given by the ED provider.  #1 type 1 diabetes with hyperosmolar nonketotic hyperglycemia  Observe patient telemetry  Patient was on insulin drip emergency department and has corrected to 180s  We'll transition the patient to long-acting insulin and home regimen.  Images before meals and daily at bedtime with sliding scale Insulin  Recheck BMP #2 ataxia  Status post MRI -  will wait for read  We'll discuss with neurology #3 essential hypertension  Continue home regimen #4 chronic kidney disease stage III #5 white matter abnormality on MRI   will evaluate for progression on MRI  DVT prophylaxis: Lovenox Consultants: None Code Status: Full code  Family Communication:   Disposition Plan: He should be able to return home following admission    Truett Mainland, DO [facility] Pager [phone]  If 7PM-7AM, please contact night-coverage [email or web address] Password [email or web address]

## 2016-04-30 NOTE — ED Notes (Signed)
CBG: 510 RN notified

## 2016-04-30 NOTE — Consult Note (Signed)
Neurology Consult Note  Reason for Consultation: Unsteady gait  Requesting provider: Nanda Quinton, MD  CC: poor balance and difficulty concentrating for one week, slurred speech today  HPI: This is a 64-yo RH woman who presented to the ED for evaluation of poor balance and slurred speech. History is obtained directly from the patient who is a fair historian.    She reports that she has had difficulty walking for the past week. She denies any weakness or numbness in her legs but instead describes impaired balance, feeling "woozy" when she is standing and trying to walk. She does not veer in any particular direction but just feels unsteady. She has not had any falls. In addition, she has been having difficulty concentrating and coming up with words, also for the past week. She attributed her symptoms to being tired from working extra hours and always being on the run with her children, traveling between [place] with her son and her hometown with her daughter.   Today she was at church and was unable to stand because she felt off balance. She spoke with her mother on the phone and her mother told her that her speech sounded slurred. She then noted that she was having trouble driving because she could not control her car, describing that she was having trouble maintaining her lane position. She came to the ED for further evaluation.   In the ED, she was found to have glucose 597 with negative ketones on UA. MRI brain with and without contrast was obtained and showed patchy restricted diffusion with associated T2/FLAIR hyperintensity throughout the central pons which was felt to be most suggestive of acute ischemia versus osmotic myelinolysis. She was placed on an insulin drip for a diagnosis of hyperosmolar hyperglycemic syndrome.   She is being followed by Dr. Sarina Ill at [facility] after being referred to her for evaluation of arm pain in January 2017. I have reviewed Dr.  Cathren Laine notes. She initially reported numbness of the entire R arm from hand to shoulder with associated 10/10 pain. MRI of the brain was obtained and showed no acute abnormality with scattered T2/FLAIR hyperintensities in the bihemispheric white matter felt to be most consistent with small vessel ischemic change. MRI of the cervical spine showed multilevel degenerative change with prior ACDF at C5-6, no cord abnormality. Dr. Jaynee Eagles ordered EMG/NCS for further evaluation and these were completed on 07/07/15. These showed evidence of moderate to severe carpal tunnel syndrome bilaterally but nothing to suggest polyneuropathy, radiculopathy, or brachial plexopathy. EMG/NCS  was repeated on 08/02/15 to make sure acute neuropathic process was not missed and again showed nothing apart from bilateral CTS. LP was performed to evaluate for possible MS given MRI findings and this was normal. She last saw Dr. Jaynee Eagles on 01/26/16, at which time she complained of headaches and vision changes with persistent pain and weakness in the right arm. She was noted to have 3/5 strength in the RUE with giveway and decreased sensation to all modalities in the R arm. Reflexes were normal. Right arm findings were felt to be due to pain. Repeat MRI brain was ordered.    PMH:  Past Medical History:  Diagnosis Date  . Asthma   . DDD (degenerative disc disease), cervical   . Diabetes mellitus   . Diabetes mellitus ([place])   . GERD (gastroesophageal reflux disease)   . Hypertension   . Hypertension   . Neuropathy ([facility])     PSH:  Past Surgical History:  Procedure Laterality Date  . ADENOIDECTOMY    . APPENDECTOMY    . CERVICAL SPINE SURGERY  06/2012   C5-C6  . SHOULDER SURGERY    . TONSILLECTOMY      Family history: Family History  Problem Relation Age of Onset  . Heart disease    . Cancer      colon, stomach, lung  . Diabetes    . Seizures      Social history:  Social History   Social History  . Marital status:  Single    Spouse name: N/A  . Number of children: 3  . Years of education: 12+   Occupational History  . [employer]     Social History Main Topics  . Smoking status: Never Smoker  . Smokeless tobacco: Never Used  . Alcohol use No  . Drug use: No  . Sexual activity: Not on file   Other Topics Concern  . Not on file   Social History Narrative   Lives with daughter   Caffeine use: daily    Current outpatient meds: Current Meds  Medication Sig  . acetaminophen-codeine (TYLENOL #3) 300-30 MG tablet Take 1 tablet by mouth every 4 (four) hours as needed for pain.  Marland Kitchen amLODipine (NORVASC) 10 MG tablet Take 1 tablet (10 mg total) by mouth daily.  . hydrochlorothiazide (HYDRODIURIL) 25 MG tablet Take 1 tablet (25 mg total) by mouth daily.  . insulin detemir (LEVEMIR) 100 UNIT/ML injection Inject 0.15 mLs (15 Units total) into the skin 2 (two) times daily.  Marland Kitchen lisinopril (PRINIVIL,ZESTRIL) 20 MG tablet Take 20 mg by mouth daily.  . meclizine (ANTIVERT) 25 MG tablet Take 25 mg by mouth 3 (three) times daily as needed for dizziness.  Marland Kitchen omeprazole (PRILOSEC) 40 MG capsule Take 1 capsule (40 mg total) by mouth daily. Reported on 06/08/2015    Current inpatient meds:  Current Facility-Administered Medications  Medication Dose Route Frequency Provider Last Rate Last Dose  . albuterol (PROVENTIL HFA;VENTOLIN HFA) 108 (90 Base) MCG/ACT inhaler 1-2 puff  1-2 puff Inhalation Q6H PRN Tanna Savoy Stinson, DO      . amLODipine (NORVASC) tablet 10 mg  10 mg Oral Daily Tanna Savoy Stinson, DO      . enoxaparin (LOVENOX) injection 40 mg  40 mg Subcutaneous Q24H Jacob J Stinson, DO      . gabapentin (NEURONTIN) capsule 600 mg  600 mg Oral QODAY Tanna Savoy Stinson, DO      . hydrochlorothiazide (HYDRODIURIL) tablet 25 mg  25 mg Oral Daily Tanna Savoy Stinson, DO      . lisinopril (PRINIVIL,ZESTRIL) tablet 20 mg  20 mg Oral Daily Tanna Savoy Stinson, DO      . meclizine (ANTIVERT) tablet 25 mg  25 mg Oral TID PRN  Tanna Savoy Stinson, DO      . olopatadine (PATANOL) 0.1 % ophthalmic solution 1 drop  1 drop Both Eyes BID Tanna Savoy Stinson, DO      . pantoprazole (PROTONIX) EC tablet 80 mg  80 mg Oral Daily Truett Mainland, DO       Current Outpatient Prescriptions  Medication Sig Dispense Refill  . acetaminophen-codeine (TYLENOL #3) 300-30 MG tablet Take 1 tablet by mouth every 4 (four) hours as needed for pain.  0  . amLODipine (NORVASC) 10 MG tablet Take 1 tablet (10 mg total) by mouth daily. 30 tablet 5  . hydrochlorothiazide (HYDRODIURIL) 25 MG tablet Take 1 tablet (25 mg total) by mouth daily.  90 tablet 1  . insulin detemir (LEVEMIR) 100 UNIT/ML injection Inject 0.15 mLs (15 Units total) into the skin 2 (two) times daily. 10 mL 3  . lisinopril (PRINIVIL,ZESTRIL) 20 MG tablet Take 20 mg by mouth daily.    . meclizine (ANTIVERT) 25 MG tablet Take 25 mg by mouth 3 (three) times daily as needed for dizziness.    Marland Kitchen omeprazole (PRILOSEC) 40 MG capsule Take 1 capsule (40 mg total) by mouth daily. Reported on 06/08/2015 30 capsule 5  . albuterol (PROVENTIL HFA;VENTOLIN HFA) 108 (90 Base) MCG/ACT inhaler Inhale 1-2 puffs into the lungs every 6 (six) hours as needed for wheezing or shortness of breath. 1 Inhaler 1  . Blood Glucose Monitoring Suppl (ACCU-CHEK AVIVA PLUS) w/Device KIT 1 each by Does not apply route QID. (Patient not taking: Reported on 04/30/2016) 1 kit 0  . gabapentin (NEURONTIN) 300 MG capsule Take 2 capsules (600 mg total) by mouth 3 (three) times daily. (Patient taking differently: Take 600 mg by mouth every other day. 600 mg 3 times daily every other day) 90 capsule 11  . glucose blood (ACCU-CHEK AVIVA) test strip Check blood sugar 5 times a day, am, before meals and at bedtime. (Patient not taking: Reported on 04/30/2016) 100 each 12  . insulin aspart (NOVOLOG) 100 UNIT/ML FlexPen Inject 10 Units into the skin 3 (three) times daily with meals. 15 mL 11  . Insulin Pen Needle 31G X 6 MM MISC 1 each by  Does not apply route 4 (four) times daily - after meals and at bedtime. (Patient not taking: Reported on 04/30/2016) 100 each 5  . Lancet Device MISC 1 each by Does not apply route 4 (four) times daily -  before meals and at bedtime. (Patient not taking: Reported on 04/30/2016) 1 each 11  . Olopatadine HCl (PATADAY) 0.2 % SOLN Apply 1 drop to eye daily. (Patient taking differently: Apply 1 drop to eye daily as needed (allergies). ) 1 Bottle 0  . sertraline (ZOLOFT) 50 MG tablet Take 1 tablet (50 mg total) by mouth daily. 30 tablet 3  . tiZANidine (ZANAFLEX) 4 MG tablet Take 4 mg by mouth 2 (two) times daily.      Allergies: Allergies  Allergen Reactions  . Aspirin Other (See Comments)    Not to use due to laser eye surgery  . Lisinopril Swelling and Other (See Comments)    Facial swelling   . Penicillins Hives    Has patient had a PCN reaction causing immediate rash, facial/tongue/throat swelling, SOB or lightheadedness with hypotension: [place] Has patient had a PCN reaction causing severe rash involving mucus membranes or skin necrosis: Nono Has patient had a PCN reaction that required hospitalization N[place]iven in hospital Has patient had a PCN reaction occurring within the last 10 years: Nono If all of the above answers are "NO", then may proceed wit    ROS: As per HPI. A full 14-point review of systems was performed and is otherwise notable for dry mouth and constant thirst. She reports occasional palpitations.   PE:  BP 143/92   Pulse 81   Temp 98.2 F (36.8 C)   Resp 16   Ht _0  (1.6 m)   Wt 74.8 kg (165 lb)   LMP 04/12/2016   SpO2 100%   BMI 29.23 kg/m   General: WDWN, no acute distress. AAO x4. She has mild dysarthria that is intermittent in character. No aphasia. Follows commands briskly. Affect is a bit irritable. She is tearful  at times and cries inappropriately during the conversation. Comportment is normal.  HEENT: Normocephalic. Neck supple without LAD. MMM, OP  clear. Dentition good. Sclerae anicteric. No conjunctival injection.  CV: Regular, no murmur. Carotid pulses full and symmetric, no bruits. Distal pulses 2+ and symmetric.  Lungs: CTAB.  Abdomen: Soft, non-distended, non-tender. Bowel sounds present x4.  Extremities: No C/C/E. Neuro:  CN: Pupils are equal and round. They are symmetrically reactive from 3-->2 mm. EOMI notable for some breakup of smooth pursuits without nystagmus. No reported diplopia. Facial sensation is intact to light touch but decreased to pinprick on the left.  Face is symmetric at rest with normal strength and mobility. Hearing is intact to conversational voice. Palate elevates symmetrically and uvula is midline. Voice is normal in tone, pitch and quality. Bilateral SCM and trapezii are 5/5. Tongue is midline with normal bulk and mobility.  Motor: Normal bulk, tone, and strength. No tremor or other abnormal movements. No drift.  Sensation: Intact to light touch.She reports decreased pinprick on the left. Vibration is normal in both great toes.  DTRs: 3+, symmetric in the arms, 2+ both knees, trace both ankles. Toes downgoing on the right, mute on the left. Coordination: Finger-to-nose and heel-to-shin are without dysmetria. Finger taps are normal in amplitude and speed, no decrement.    Labs:  Lab Results  Component Value Date   WBC 4.7 04/30/2016   HGB 12.0 04/30/2016   HCT 34.6 (L) 04/30/2016   PLT 318 04/30/2016   GLUCOSE 597 (HH) 04/30/2016   CHOL 150 12/31/2015   TRIG 125 12/31/2015   HDL 68 12/31/2015   LDLCALC 57 12/31/2015   ALT 18 04/30/2016   AST 19 04/30/2016   NA 129 (L) 04/30/2016   K 4.1 04/30/2016   CL 96 (L) 04/30/2016   CREATININE 1.52 (H) 04/30/2016   BUN 23 (H) 04/30/2016   CO2 23 04/30/2016   TSH 1.02 12/31/2015   INR 0.96 04/30/2016   HGBA1C 11.4 03/23/2016   MICROALBUR 64.4 12/31/2015   UA notable for spec grav 1.031, glucose >1000, ketones negative Troponin 0.00  Imaging:  I have  personally and independently reviewed the MRI of the brain from today. This shows patchy restricted diffusion through the central pons with associated hypointensity on ADC. There is corresponding hyperintensity in the central pons on T2/FLAIR. Hyperintensity is also noted in the splenium of the corpus callous with associated T2-shine-through on DWI. There is a moderate burden of subcentimeter areas of T2/FLAIR hyperintensities in the bihemispheric white matter most consistent with chronic small vessel ischemic change. No abnormal contrast enhancement is present.   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 I do not favor acute stroke in this case. This has occurred in the setting of hyperosmotic hyperglycemic syndrome (HHS). CPM has been described both at the onset of HHS and with overly rapid correction of glucose in HHS. Treatment is supportive. Glucose needs to be lowered but would avoid overly rapid correction to minimize exacerbation of myelinolysis.   2. HHS: On insulin drip. Defer treatment to IM.   3. Dysarthria: She has mild dysarthria, consistent with CPM. Follow.   4. Abnormality of gait: She has unsteadiness with walking by report. This is due to CPM. PT as needed.   5. Left hemisensory loss: This is acute, due to CPM. PT/OT as needed, though it does not appear that this is functionally limiting as she only appreciated it during the  exam.   6. Pseudobulbar affect: She has some mild inappropriate crying during the exam. This is consistent with CPM. Follow.   7. Cerebrovascular disease: MRI shows findings most compatible with accelerated small vessel disease. She has uncontrolled DM in addition to HTN. She needs to take her DM more seriously and work to improve control in order to reduce risk of future stroke and other vascular events. Recommend aggressive RF modification. Consider adding aspirin.   This was  discussed with the patient and she is in agreement with the plan as noted. Neurology will follow. Call with any new questions or concerns.

## 2016-04-30 NOTE — ED Triage Notes (Signed)
Patient complains of dizziness and unsteady gait since last Monday. Today while driving unable to keep her car in the lane and slurred speech. No headache. Denies pain. Alert and oriented on arrival.

## 2016-04-30 NOTE — ED Notes (Signed)
Pt. Returned from MRI 

## 2016-04-30 NOTE — ED Provider Notes (Signed)
Emergency Department Provider Note   I have reviewed the triage vital signs and the nursing notes.   HISTORY  Chief Complaint Dizziness and Aphasia   HPI Christina Orozco is a 45 y.o. female with PMH of DM, GERD, HTN, and Neuropathy presents to the emergency room in for evaluation of gait instability for the last week and acute onset slurred speech and feeling of intoxication that started this morning. No appreciable focal neurological deficits according to the patient other than slurred speech. She has her blood sugars have been running high at home. She denies any vomiting or nausea. No abdominal pain. She's been compliant with her medications but endorses drinking multiple sodas both today and yesterday because is thirsty.    Past Medical History:  Diagnosis Date  . Asthma   . DDD (degenerative disc disease), cervical   . Diabetes mellitus   . Diabetes mellitus ([place])   . GERD (gastroesophageal reflux disease)   . Hypertension   . Hypertension   . Neuropathy [facility])     Patient Active Problem List   Diagnosis Date Noted  . Type 2 diabetes mellitus with hyperosmolar nonketotic hyperglycemia ([place]) 04/30/2016  . Dizziness 04/30/2016  . Hyperglycemia 04/30/2016  . Chronic kidney disease (CKD), stage III (moderate) 04/30/2016  . Ataxia 04/30/2016  . Chronic right shoulder pain 04/03/2016  . White matter abnormality on MRI of brain 01/26/2016  . Type 2 diabetes mellitus with diabetic neuropathy, with long-term current use of insulin ([place]) 11/16/2015  . Rhinitis, allergic 08/25/2015  . Asthma, mild intermittent 07/14/2015  . Gastroesophageal reflux disease without esophagitis 06/08/2015  . Alopecia 06/08/2015  . Type 2 diabetes mellitus without complication, without long-term current use of insulin ([place]) 05/05/2015  . Essential hypertension 05/05/2015  . Vitamin D deficiency 05/05/2015    Past Surgical History:  Procedure Laterality Date  . ADENOIDECTOMY    .  APPENDECTOMY    . CERVICAL SPINE SURGERY  06/2012   C5-C6  . SHOULDER SURGERY    . TONSILLECTOMY        Allergies Aspirin; Lisinopril; and Penicillins  Family History  Problem Relation Age of Onset  . Heart disease    . Cancer      colon, stomach, lung  . Diabetes    . Seizures      Social History Social History  Substance Use Topics  . Smoking status: Never Smoker  . Smokeless tobacco: Never Used  . Alcohol use No    Review of Systems  Constitutional: No fever/chills Eyes: No visual changes. ENT: No sore throat. Cardiovascular: Denies chest pain. Respiratory: Denies shortness of breath. Gastrointestinal: No abdominal pain.  No nausea, no vomiting.  No diarrhea.  No constipation. Genitourinary: Negative for dysuria. Musculoskeletal: Negative for back pain. Skin: Negative for rash. Neurological: Negative for headaches, focal weakness or numbness. Positive gait instability, slurred speech, and feeling "drunk"  10-point ROS otherwise negative.  ____________________________________________   PHYSICAL EXAM:  VITAL SIGNS: ED Triage Vitals  Enc Vitals Group     BP 04/30/16 1147 166/93     Pulse Rate 04/30/16 1147 89     Resp 04/30/16 1147 18     Temp 04/30/16 1147 98.4 F (36.9 C)     Temp Source 04/30/16 1147 Oral     SpO2 04/30/16 1147 100 %     Weight 04/30/16 1148 165 lb (74.8 kg)     Height 04/30/16 1148 5\' 3"  (1.6 m)    Constitutional: Alert and oriented. Well appearing and  in no acute distress. Notable slurred speech.  Eyes: Conjunctivae are normal. PERRL. EOMI. Head: Atraumatic. Nose: No congestion/rhinnorhea. Mouth/Throat: Mucous membranes are dry. Oropharynx non-erythematous. Neck: No stridor.   Cardiovascular: Normal rate, regular rhythm. Good peripheral circulation. Grossly normal heart sounds.   Respiratory: Normal respiratory effort.  No retractions. Lungs CTAB. Gastrointestinal: Soft and nontender. No distention.  Musculoskeletal: No  lower extremity tenderness nor edema. No gross deformities of extremities. Neurologic: Slurred speech. 4/5 RUE strength (triceps and deltoids). Otherwise normal strength in other extremities.  Skin:  Skin is warm, dry and intact. No rash noted.   ____________________________________________   LABS (all labs ordered are listed, but only abnormal results are displayed)  Labs Reviewed  CBC - Abnormal; Notable for the following:       Result Value   HCT 34.6 (*)    All other components within normal limits  COMPREHENSIVE METABOLIC PANEL - Abnormal; Notable for the following:    Sodium 129 (*)    Chloride 96 (*)    Glucose, Bld 597 (*)    BUN 23 (*)    Creatinine, Ser 1.52 (*)    GFR calc non Af Amer 41 (*)    GFR calc Af Amer 48 (*)    All other components within normal limits  URINALYSIS, ROUTINE W REFLEX MICROSCOPIC (NOT AT [facility]) - Abnormal; Notable for the following:    Specific Gravity, Urine 1.031 (*)    Glucose, UA >1000 (*)    All other components within normal limits  URINE MICROSCOPIC-ADD ON - Abnormal; Notable for the following:    Squamous Epithelial / LPF 0-5 (*)    All other components within normal limits  BASIC METABOLIC PANEL - Abnormal; Notable for the following:    Potassium 3.3 (*)    Glucose, Bld 236 (*)    Creatinine, Ser 1.25 (*)    GFR calc non Af Amer 52 (*)    All other components within normal limits  BASIC METABOLIC PANEL - Abnormal; Notable for the following:    Glucose, Bld 272 (*)    Creatinine, Ser 1.25 (*)    Calcium 8.8 (*)    GFR calc non Af Amer 52 (*)    All other components within normal limits  GLUCOSE, CAPILLARY - Abnormal; Notable for the following:    Glucose-Capillary 218 (*)    All other components within normal limits  GLUCOSE, CAPILLARY - Abnormal; Notable for the following:    Glucose-Capillary 196 (*)    All other components within normal limits  CBG MONITORING, ED - Abnormal; Notable for the following:    Glucose-Capillary  510 (*)    All other components within normal limits  CBG MONITORING, ED - Abnormal; Notable for the following:    Glucose-Capillary 238 (*)    All other components within normal limits  CBG MONITORING, ED - Abnormal; Notable for the following:    Glucose-Capillary 187 (*)    All other components within normal limits  PROTIME-INR  APTT  DIFFERENTIAL  I-STAT TROPOININ, ED   ____________________________________________  EKG   EKG Interpretation  Date/Time:  Sunday April 30 2016 12:00:51 EST Ventricular Rate:  83 PR Interval:  128 QRS Duration: 82 QT Interval:  378 QTC Calculation: 444 R Axis:   68 Text Interpretation:  Normal sinus rhythm Normal ECG No STEMI.  Confirmed by LONG MD, JOSHUA [phone]) on 04/30/2016 3:31:14 PM       ____________________________________________  RADIOLOGY  Ct Head Wo Contrast  Result Date: 04/30/2016  CLINICAL DATA:  45 year old female with dizziness and slurred speech. EXAM: CT HEAD WITHOUT CONTRAST TECHNIQUE: Contiguous axial images were obtained from the base of the skull through the vertex without intravenous contrast. COMPARISON:  02/12/2016 MR and 12/22/2003 CT FINDINGS: Brain: No evidence of acute infarction, hemorrhage, hydrocephalus, extra-axial collection or mass lesion/mass effect. Mild white matter hypodensities are unchanged. Vascular: No hyperdense vessel or unexpected calcification. Skull: Normal. Negative for fracture or focal lesion. Sinuses/Orbits: No acute finding. Other: None. IMPRESSION: No evidence of acute intracranial abnormality. Unchanged mild white matter disease. Electronically Signed   By: Margarette Canada M.D.   On: 04/30/2016 12:47   [name] And Wo Contrast  Result Date: 04/30/2016 CLINICAL DATA:  45 year old female with slurred speech, dizziness, gait instability for 6 days. Initial encounter. EXAM: MRI HEAD WITHOUT AND WITH CONTRAST TECHNIQUE: Multiplanar, multiecho pulse sequences of the brain and surrounding  structures were obtained without and with intravenous contrast. CONTRAST:  59mL MULTIHANCE GADOBENATE DIMEGLUMINE 529 MG/ML IV SOLN COMPARISON:  Noncontrast head CT 1235 hours today. Brain MRIs 02/12/2016 and 07/04/2015 FINDINGS: Brain: Significantly progressed patchy and confluent T2 hyperintensity throughout the pons compared to the 02/12/2016 MRI, in this was largely nonexistent on the January 2017 MRI. These areas demonstrate restricted diffusion (series 4, image 13 and series 400, image 13). There is minimal pontine swelling. No evidence of associated hemorrhage and no significant regional mass effect. Following contrast there is no associated enhancement. Elsewhere there is no other restricted diffusion. Preexisting nonspecific abnormal cerebral white matter T2 and FLAIR hyperintensity, including somewhat confluent involvement about the splenium of the corpus callosum (series 7, image 14) appears stable since September but increased since January. Similar T2 and FLAIR hyperintensity in the medial right thalamus appears slightly regressed since September. No cortical encephalomalacia. Cerebellum appears normal. No chronic cerebral blood products. No abnormal enhancement identified. No dural thickening. No ventriculomegaly. Patent basilar cisterns. No midline shift or evidence of intracranial mass lesion. Negative pituitary and cervicomedullary junction. Vascular: Major intracranial vascular flow voids are stable with dominant appearing distal left vertebral artery. Skull and upper cervical spine: Negative. Visualized bone marrow signal is within normal limits. Sinuses/Orbits: Visualized paranasal sinuses and mastoids are stable and well pneumatized. Negative orbit soft tissues. Other: Visible internal auditory structures appear normal. Small right forehead scalp lipoma incidentally re- demonstrated (series 6, image 19). Otherwise negative scalp soft tissues. Incidental adenoid hypertrophy. IMPRESSION: 1. Patchy  and confluent abnormal signal in the pons with restricted diffusion and no abnormal enhancement. No associated hemorrhage or mass effect. This most resembles acute pontine infarct. The main differential consideration for this finding in isolation is osmotic demyelination, but see also #2. 2. Superimposed age advanced but nonspecific cerebral white matter and right thalamic signal abnormality, with progression in the splenium of the corpus callosum since January. Top differential considerations include accelerated small vessel ischemia, demyelinating disease, hypercoagulable state, and less likely viral or atypical CNS infection. Electronically Signed   By: Genevie Ann M.D.   On: 04/30/2016 17:34    ____________________________________________   PROCEDURES  Procedure(s) performed:   Procedures  None ____________________________________________   INITIAL IMPRESSION / ASSESSMENT AND PLAN / ED COURSE  Pertinent labs & imaging results that were available during my care of the patient were reviewed by me and considered in my medical decision making (see chart for details).  Patient resents to the emergency pertinent for evaluation of acute onset slurred speech and 1 week of gait instability. She has right upper extremity weakness  that she states is old. Review the notes show concern for possible demyelinating disease on MRI done on 02/2016. Plan to continue following the labs. Will start on glucose stabilizer with severe hyperglycemia. No lab evidence to suggest DKA. No other signs or symptoms of infection. Plan to discuss the case with neurology to decide if repeat MRI/admission is advised in this case.   Discussed patient's case with hospitlaist, Dr. Nehemiah Settle.  Recommend admission to stepdown bed.  I will place holding orders per their request. Patient and family (if present) updated with plan. Care transferred to hospitalist service.  I reviewed all nursing notes, vitals, pertinent old records, EKGs,  labs, imaging (as available).  ____________________________________________  FINAL CLINICAL IMPRESSION(S) / ED DIAGNOSES  Final diagnoses:  Gait instability  Slurred speech     MEDICATIONS GIVEN DURING THIS VISIT:  Medications  meclizine (ANTIVERT) tablet 25 mg (not administered)  hydrochlorothiazide (HYDRODIURIL) tablet 25 mg (25 mg Oral Given 05/01/16 0918)  olopatadine (PATANOL) 0.1 % ophthalmic solution 1 drop (1 drop Both Eyes Not Given 05/01/16 0919)  albuterol (PROVENTIL) (2.5 MG/3ML) 0.083% nebulizer solution 3 mL (not administered)  amLODipine (NORVASC) tablet 10 mg (10 mg Oral Given 04/30/16 2015)  pantoprazole (PROTONIX) EC tablet 40 mg (40 mg Oral Given 05/01/16 0917)  enoxaparin (LOVENOX) injection 40 mg (40 mg Subcutaneous Given 04/30/16 2016)  tiZANidine (ZANAFLEX) tablet 4 mg (4 mg Oral Given 05/01/16 0917)  sertraline (ZOLOFT) tablet 50 mg (50 mg Oral Given 05/01/16 0917)  insulin aspart (novoLOG) injection 10 Units (10 Units Subcutaneous Given 05/01/16 0916)  insulin detemir (LEVEMIR) injection 20 Units (20 Units Subcutaneous Given 05/01/16 0915)  acetaminophen (TYLENOL) tablet 650 mg (not administered)    Or  acetaminophen (TYLENOL) suppository 650 mg (not administered)  sodium chloride flush (NS) 0.9 % injection 3 mL (3 mLs Intravenous Given 05/01/16 0920)  ondansetron (ZOFRAN) tablet 4 mg (not administered)    Or  ondansetron (ZOFRAN) injection 4 mg (not administered)  insulin aspart (novoLOG) injection 0-15 Units (3 Units Subcutaneous Given 05/01/16 0916)  insulin aspart (novoLOG) injection 0-5 Units (2 Units Subcutaneous Given 04/30/16 2224)  insulin aspart (novoLOG) injection 8 Units (8 Units Intravenous Given 04/30/16 1439)  sodium chloride 0.9 % bolus 1,000 mL (0 mLs Intravenous Stopped 04/30/16 1740)  gadobenate dimeglumine (MULTIHANCE) injection 15 mL (15 mLs Intravenous Contrast Given 04/30/16 1715)  potassium chloride SA (K-DUR,KLOR-CON) CR tablet  40 mEq (40 mEq Oral Given 04/30/16 2221)     NEW OUTPATIENT MEDICATIONS STARTED DURING THIS VISIT:  None   Note:  This document was prepared using Dragon voice recognition software and may include unintentional dictation errors.  Nanda Quinton, MD Emergency Medicine   Margette Fast, MD 05/01/16 [phone]

## 2016-04-30 NOTE — ED Notes (Signed)
Dr. Sheran Lawless is at the bedside.

## 2016-04-30 NOTE — ED Notes (Signed)
Insulin drip stopped per Dr.Stinson.

## 2016-04-30 NOTE — ED Notes (Signed)
Patient transported to MRI 

## 2016-05-01 DIAGNOSIS — N183 Chronic kidney disease, stage 3 (moderate): Secondary | ICD-10-CM | POA: Diagnosis present

## 2016-05-01 DIAGNOSIS — I639 Cerebral infarction, unspecified: Secondary | ICD-10-CM | POA: Insufficient documentation

## 2016-05-01 DIAGNOSIS — E1100 Type 2 diabetes mellitus with hyperosmolarity without nonketotic hyperglycemic-hyperosmolar coma (NKHHC): Secondary | ICD-10-CM | POA: Diagnosis present

## 2016-05-01 DIAGNOSIS — R4781 Slurred speech: Secondary | ICD-10-CM | POA: Diagnosis not present

## 2016-05-01 DIAGNOSIS — Z880 Allergy status to penicillin: Secondary | ICD-10-CM | POA: Diagnosis not present

## 2016-05-01 DIAGNOSIS — G372 Central pontine myelinolysis: Secondary | ICD-10-CM | POA: Diagnosis not present

## 2016-05-01 DIAGNOSIS — I6350 Cerebral infarction due to unspecified occlusion or stenosis of unspecified cerebral artery: Secondary | ICD-10-CM

## 2016-05-01 DIAGNOSIS — I6789 Other cerebrovascular disease: Secondary | ICD-10-CM | POA: Diagnosis not present

## 2016-05-01 DIAGNOSIS — Z886 Allergy status to analgesic agent status: Secondary | ICD-10-CM | POA: Diagnosis not present

## 2016-05-01 DIAGNOSIS — Z888 Allergy status to other drugs, medicaments and biological substances status: Secondary | ICD-10-CM | POA: Diagnosis not present

## 2016-05-01 DIAGNOSIS — Z833 Family history of diabetes mellitus: Secondary | ICD-10-CM | POA: Diagnosis not present

## 2016-05-01 DIAGNOSIS — E1142 Type 2 diabetes mellitus with diabetic polyneuropathy: Secondary | ICD-10-CM | POA: Diagnosis present

## 2016-05-01 DIAGNOSIS — R270 Ataxia, unspecified: Secondary | ICD-10-CM | POA: Diagnosis not present

## 2016-05-01 DIAGNOSIS — R2681 Unsteadiness on feet: Secondary | ICD-10-CM | POA: Diagnosis not present

## 2016-05-01 DIAGNOSIS — E1101 Type 2 diabetes mellitus with hyperosmolarity with coma: Secondary | ICD-10-CM | POA: Diagnosis not present

## 2016-05-01 DIAGNOSIS — K219 Gastro-esophageal reflux disease without esophagitis: Secondary | ICD-10-CM | POA: Diagnosis present

## 2016-05-01 DIAGNOSIS — I129 Hypertensive chronic kidney disease with stage 1 through stage 4 chronic kidney disease, or unspecified chronic kidney disease: Secondary | ICD-10-CM | POA: Diagnosis present

## 2016-05-01 DIAGNOSIS — F482 Pseudobulbar affect: Secondary | ICD-10-CM | POA: Diagnosis present

## 2016-05-01 DIAGNOSIS — R471 Dysarthria and anarthria: Secondary | ICD-10-CM | POA: Diagnosis present

## 2016-05-01 LAB — GLUCOSE, CAPILLARY
GLUCOSE-CAPILLARY: 196 mg/dL — AB (ref 65–99)
GLUCOSE-CAPILLARY: 148 mg/dL — AB (ref 65–99)
GLUCOSE-CAPILLARY: 131 mg/dL — AB (ref 65–99)
Glucose-Capillary: 81 mg/dL (ref 65–99)

## 2016-05-01 LAB — BASIC METABOLIC PANEL
Anion gap: 7 (ref 5–15)
BUN: 18 mg/dL (ref 6–20)
CHLORIDE: 104 mmol/L (ref 101–111)
CO2: 24 mmol/L (ref 22–32)
CREATININE: 1.25 mg/dL — AB (ref 0.44–1.00)
Calcium: 8.8 mg/dL — ABNORMAL LOW (ref 8.9–10.3)
GFR, EST NON AFRICAN AMERICAN: 52 mL/min — AB (ref >60)
Glucose, Bld: 272 mg/dL — ABNORMAL HIGH (ref 65–99)
POTASSIUM: 3.6 mmol/L (ref 3.5–5.1)
SODIUM: 135 mmol/L (ref 135–145)

## 2016-05-01 MED ORDER — STROKE: EARLY STAGES OF RECOVERY BOOK
Freq: Once | Status: AC
  Administered 2016-05-01: 14:00:00
  Filled 2016-05-01: qty 1

## 2016-05-01 MED ORDER — ATORVASTATIN CALCIUM 40 MG PO TABS
40.0000 mg | ORAL_TABLET | Freq: Every day | ORAL | Status: DC
  Administered 2016-05-01 – 2016-05-02 (×2): 40 mg via ORAL
  Filled 2016-05-01 (×2): qty 1

## 2016-05-01 MED ORDER — INSULIN DETEMIR 100 UNIT/ML ~~LOC~~ SOLN
25.0000 [IU] | Freq: Two times a day (BID) | SUBCUTANEOUS | Status: DC
  Administered 2016-05-01 – 2016-05-03 (×4): 25 [IU] via SUBCUTANEOUS
  Filled 2016-05-01 (×5): qty 0.25

## 2016-05-01 NOTE — Evaluation (Signed)
Physical Therapy Evaluation Patient Details Name: Christina Orozco MRN: [id number] DOB: May 07, 1971 Today's Date: 05/01/2016   History of Present Illness  Christina Orozco is a 45 y.o. female with a history of DM2, GERD, HTN, neuropathy. Patient presented to the emergency Department for evaluation of gait instability has been worsening over the past week with slurred speech and feeling intoxicated. 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   Clinical Impression  The  Patient presents with good motor function and no noted incordination until standing and ambulating. Reliant on upper body support for balance. She relates holding onto walls and objects for 1 week for balance. The patient can benefit from F/U PT for  Balance training. Pt admitted with above diagnosis. Pt currently with functional limitations due to the deficits listed below (see PT Problem List). Patient will benefit from skilled PT to increase their independence and safety with mobility to allow discharge to the venue listed below.       Follow Up Recommendations Home health PT;Supervision/Assistance - 24 hour vs OPPT if able and has transportation.    Equipment Recommendations  Rolling walker with 5" wheels    Recommendations for Other Services       Precautions / Restrictions Precautions Precautions: Fall Precaution Comments: Pt reports she fell one time at work pta Restrictions Weight Bearing Restrictions: No      Mobility  Bed Mobility Overal bed mobility: Independent                Transfers Overall transfer level: Needs assistance   Transfers: Sit to/from Stand Sit to Stand: Min guard            Ambulation/Gait Ambulation/Gait assistance: Min guard;Min assist Ambulation Distance (Feet): 100 Feet Assistive device: Rolling walker (2 wheeled) Gait Pattern/deviations: Step-through  pattern     General Gait Details: ambulated x 8 ' without UE support, gait noted to be unsteady and drifting. Provided HHA  then RW with improved balance and stability.  Stairs            Wheelchair Mobility    Modified Rankin (Stroke Patients Only)       Balance Overall balance assessment: Needs assistance Sitting-balance support: No upper extremity supported Sitting balance-Leahy Scale: Good     Standing balance support: During functional activity;No upper extremity supported Standing balance-Leahy Scale: Poor Standing balance comment: reliant on UE support for balance, standing with eyes closed with immediate loss of balance                             Pertinent Vitals/Pain Pain Assessment: No/denies pain    Home Living Family/patient expects to be discharged to:: Private residence Living Arrangements: Children (63 yo dtr and son who is home for Thanksgiving) Available Help at Discharge: Family;Available PRN/intermittently Type of Home: House Home Access: Level entry     Home Layout: One level Home Equipment: None      Prior Function Level of Independence: Independent               Hand Dominance   Dominant Hand: Right    Extremity/Trunk Assessment   Upper Extremity Assessment: Defer to OT evaluation           Lower Extremity Assessment: RLE deficits/detail;LLE deficits/detail RLE Deficits / Details: strength appears WFL, does not demonstrate any incoordination during testing .  LLE Deficits / Details: same  as right     Communication   Communication: No difficulties  Cognition Arousal/Alertness: Awake/alert Behavior During Therapy: WFL for tasks assessed/performed Overall Cognitive Status: Within Functional Limits for tasks assessed                 General Comments: emotional labile at times.    General Comments      Exercises     Assessment/Plan    PT Assessment Patient needs continued PT services  PT  Problem List Decreased activity tolerance;Decreased balance;Decreased mobility;Decreased safety awareness          PT Treatment Interventions DME instruction;Gait training;Functional mobility training;Therapeutic activities;Therapeutic exercise;Balance training    PT Goals (Current goals can be found in the Care Plan section)  Acute Rehab PT Goals Patient Stated Goal: to go home and back to work PT Goal Formulation: With patient Time For Goal Achievement: 05/15/16 Potential to Achieve Goals: Good    Frequency Min 5X/week   Barriers to discharge        Co-evaluation PT/OT/SLP Co-Evaluation/Treatment: Yes Reason for Co-Treatment: Complexity of the patient's impairments (multi-system involvement) PT goals addressed during session: Mobility/safety with mobility OT goals addressed during session: ADL's and self-care;Strengthening/ROM       End of Session Equipment Utilized During Treatment: Gait belt Activity Tolerance: Patient tolerated treatment well Patient left: in chair;with call bell/phone within reach;with chair alarm set Nurse Communication: Mobility status    Functional Assessment Tool Used: clincal judgement Functional Limitation: Mobility: Walking and moving around Mobility: Walking and Moving Around Current Status [phone]): At least 20 percent but less than 40 percent impaired, limited or restricted Mobility: Walking and Moving Around Goal Status [phone]): 0 percent impaired, limited or restricted    Time: 0816-0842 PT Time Calculation (min) (ACUTE ONLY): 26 min   Charges:   PT Evaluation $PT Eval Low Complexity: 1 Procedure     PT G Codes:   PT G-Codes **NOT FOR INPATIENT CLASS** Functional Assessment Tool Used: clincal judgement Functional Limitation: Mobility: Walking and moving around Mobility: Walking and Moving Around Current Status ([id number]): At least 20 percent but less than 40 percent impaired, limited or restricted Mobility: Walking and Moving Around  Goal Status [phone]): 0 percent impaired, limited or restricted    Claretha Cooper 05/01/2016, 9:49 AM Tresa Endo PT [phone]

## 2016-05-01 NOTE — Care Management Note (Addendum)
Case Management Note  Patient Details  Name: Christina Orozco MRN: [id number] Date of Birth: 31-Aug-1970  Subjective/Objective:         Pt with hx of DM2, GERD, HTN, neuropathy. Patient presents with   diabetes mellitus with hyperosmolar nonketotic hyperglycemia. 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 States PTA was independent with ADL's and used no assistive devices.   PCP: Cammie Sickle  Action/Plan: Plan is to d/c to home with home health services (PT,OT,NA) today.  Expected Discharge Date:   05/01/2016            Expected Discharge Plan:  [place]  In-House Referral:     Discharge planning Services  CM Consult  Post Acute Care Choice:    Choice offered to:  Patient  DME Arranged:  Walker rolling, Tub bench DME Agency:  [facility] [employer] CM made referral with [facility] @ [phone]  HH Arranged:  PT, OT, Nurse's Aide HH Agency:  [facility] CM made referral with [facility] @ [phone]  Status of Service:  Completed, signed off  If discussed at [place] of Stay Meetings, dates discussed:    Additional Comments:  Sharin Mons, RN 05/01/2016, 12:56 PM

## 2016-05-01 NOTE — Evaluation (Signed)
Occupational Therapy Evaluation Patient Details Name: Christina Orozco MRN: [id number] DOB: [birth year] Today's Date: 05/01/2016    History of Present Illness Christina Orozco is a 45 y.o. female with a history of DM2, GERD, HTN, neuropathy. Patient presented to the emergency Department for evaluation of gait instability has been worsening over the past week with slurred speech and feeling intoxicated. 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    Clinical Impression   This 45 yo female admitted with above presents to acute OT with deficits below (see OT problem list) thus affecting her PLOF of totally independent with basic ADLs and IADLs including working and driving. She will benefit from continued OT acutely.    Follow Up Recommendations  Other (comment) (would benefit from [facility], but with Medicaid--do not think she can get. Will need transportation once family leaves post Thanksgiving)    Equipment Recommendations  Tub/shower seat       Precautions / Restrictions Precautions Precautions: Fall Precaution Comments: Pt reports she fell one time at work pta      Mobility Bed Mobility Overal bed mobility: Independent                Transfers Overall transfer level: Needs assistance   Transfers: Sit to/from Stand Sit to Stand: Min guard              Balance Overall balance assessment: Needs assistance Sitting-balance support: No upper extremity supported;Feet supported Sitting balance-Leahy Scale: Good     Standing balance support: Bilateral upper extremity supported Standing balance-Leahy Scale: Poor Standing balance comment: reliant on RW; with standing with eyes shut pt could not maintain balance                            ADL Overall ADL's : Needs assistance/impaired Eating/Feeding: Independent;Sitting   Grooming: Oral care;Min  guard;Standing   Upper Body Bathing: Set up;Sitting   Lower Body Bathing: Min guard;Sit to/from stand   Upper Body Dressing : Set up;Supervision/safety;Sitting   Lower Body Dressing: Min guard;Sit to/from stand   Toilet Transfer: Min guard;Ambulation;RW (bed>out and down hallway>return to room to recliner)   Toileting- Water quality scientist and Hygiene: Min guard;Sit to/from stand                         Pertinent Vitals/Pain Pain Assessment:  (my knees always hurt and my right shoulder)     Hand Dominance Right   Extremity/Trunk Assessment Upper Extremity Assessment Upper Extremity Assessment: Overall WFL for tasks assessed   Lower Extremity Assessment Lower Extremity Assessment: Defer to PT evaluation       Communication Communication Communication: No difficulties   Cognition Arousal/Alertness: Awake/alert Behavior During Therapy: WFL for tasks assessed/performed Overall Cognitive Status: Within Functional Limits for tasks assessed                                Home Living Family/patient expects to be discharged to:: Private residence Living Arrangements: Children (69 yo dtr and son who is home for Thanksgiving) Available Help at Discharge: Family;Available PRN/intermittently Type of Home: House Home Access: Level entry     Home Layout: One level     Bathroom Shower/Tub: Tub/shower unit;Curtain Shower/tub characteristics: Architectural technologist: Standard (sink beside)     Home Equipment: None  Prior Functioning/Environment Level of Independence: Independent                 OT Problem List: Impaired balance (sitting and/or standing)   OT Treatment/Interventions: Self-care/ADL training;Balance training;DME and/or AE instruction;Patient/family education    OT Goals(Current goals can be found in the care plan section) Acute Rehab OT Goals Patient Stated Goal: to go home and back to work OT Goal Formulation: With  patient Time For Goal Achievement: 05/08/16 Potential to Achieve Goals: Good  OT Frequency: Min 3X/week   Barriers to D/C: Decreased caregiver support (for more than this week (family home for Thanksgiving))          Co-evaluation PT/OT/SLP Co-Evaluation/Treatment: Yes     OT goals addressed during session: ADL's and self-care;Strengthening/ROM      End of Session Equipment Utilized During Treatment: Gait belt;Rolling walker  Activity Tolerance: Patient tolerated treatment well Patient left: in chair;with call bell/phone within reach;with chair alarm set   Time: 2353-6144 OT Time Calculation (min): 26 min Charges:  OT General Charges $OT Visit: 1 Procedure OT Evaluation $OT Eval Moderate Complexity: 1 Procedure G-Codes: OT G-codes **NOT FOR INPATIENT CLASS** Functional Assessment Tool Used: clinical observation Functional Limitation: Self care Self Care Current Status (R1540): At least 1 percent but less than 20 percent impaired, limited or restricted Self Care Discharge Status [phone]): At least 1 percent but less than 20 percent impaired, limited or restricted  Almon Register [phone] 05/01/2016, 9:05 AM

## 2016-05-01 NOTE — Progress Notes (Signed)
Subjective: She states that she has had no worsening since admission.   Exam: Vitals:   05/01/16 2025 05/01/16 2154  BP: (!) 164/75 (!) 164/75  Pulse: 78   Resp: 18   Temp: 98.3 F (36.8 C)    Gen: In bed, NAD Resp: non-labored breathing, no acute distress Abd: soft, nt  Neuro: MS: awake, alert, interactive an appropriate.  SL:PNPYY, EOMI, face symmetric.  Motor: She has good strength to confrontation.  Sensory:intact to LT She does have mild dysmetria on FNF bilaterally, as well as mild dysarthria.   Pertinent Labs: Glucose 131  Impression: 45 year old female with progressive gait imbalance and dysarthria over approximately one week. The progressive nature and appearance on MRI are more consistent with CPM than ischemic stroke. This has been reported even without electrolyte fluctuations in cases of hyperosmolar states, though is rare. Given the rarity, I would favor further evaluation of the posterior circulation with MRA, but if no clear cause(E.g. Severe basilar stenosis/thrombosis) then I would favor treating as CPM, which is supportive. I would try to avoid large swings in osmolality.   Recommendations: 1) MRA head and neck 2) avoid large swings in osmolality.  3) neurology will continue to follow.   Roland Rack, MD [facility] [phone]  If 7pm- 7am, please page neurology on call as listed in [facility].

## 2016-05-01 NOTE — Progress Notes (Signed)
Patient passed stroke swallow screen in ED.

## 2016-05-01 NOTE — Progress Notes (Addendum)
[facility] PROGRESS NOTE    Progress Note  Christina Orozco  SWN:[id number] DOB: 11-Dec-1970 DOA: 04/30/2016 PCP: Dorena Dew, FNP     Brief Narrative:   Christina Orozco is an 45 y.o. female past medical history of diabetes type 2, hypertension and peripheral neuropathy who presents to the ED with gait instability she relates polyuria and polydipsia denies any fever chills abdominal pain cough or shortness of breath. It was discussed with neurology Dr. Laverta Baltimore who recommended an MRI, and ED she was found to have a glucose of 600.  Assessment/Plan:  Type 2 diabetes mellitus with hyperosmolar nonketotic hyperglycemia ([facility]): Initially started on insulin drip no intense addition to long-acting insulin for sliding scale. We'll continue to titrate long-acting insulin. Check A1c.  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 She cannot be on aspirin due intraocular bleeding, as per patient she was told by her ophthalmologist. Consult Neurology. We'll allow permissive hypertension. HgbA1c, fasting lipid panel pending PT, OT, Speech consult  Echocardiogram  pending Avoid D5 fluids as may be harmfull risk factor modification  Cont. Cardiac Monitoring   Essential hypertension: Allow permissive HTN.  Chronic kidney disease (CKD), stage III (moderate): Baseline continue to monitor.   DVT prophylaxis: Lovenox Family Communication:none Disposition Plan/Barrier to D/C: home in 1-2 days Code Status:     Code Status Orders        Start     Ordered   04/30/16 1901  Full code  Continuous     04/30/16 1900    Code Status History    Date Active Date Inactive Code Status Order ID Comments User Context   04/30/2016  5:47 PM 04/30/2016  7:00 PM Full Code [id number]  Truett Mainland, DO ED        IV Access:    Peripheral IV   Procedures and diagnostic studies:   Ct Head Wo Contrast  Result Date:  04/30/2016 CLINICAL DATA:  45 year old female with dizziness and slurred speech. EXAM: CT HEAD WITHOUT CONTRAST TECHNIQUE: Contiguous axial images were obtained from the base of the skull through the vertex without intravenous contrast. COMPARISON:  02/12/2016 MR and 12/22/2003 CT FINDINGS: Brain: No evidence of acute infarction, hemorrhage, hydrocephalus, extra-axial collection or mass lesion/mass effect. Mild white matter hypodensities are unchanged. Vascular: No hyperdense vessel or unexpected calcification. Skull: Normal. Negative for fracture or focal lesion. Sinuses/Orbits: No acute finding. Other: None. IMPRESSION: No evidence of acute intracranial abnormality. Unchanged mild white matter disease. Electronically Signed   By: Margarette Canada M.D.   On: 04/30/2016 12:47   [name] And Wo Contrast  Result Date: 04/30/2016 CLINICAL DATA:  45 year old female with slurred speech, dizziness, gait instability for 6 days. Initial encounter. EXAM: MRI HEAD WITHOUT AND WITH CONTRAST TECHNIQUE: Multiplanar, multiecho pulse sequences of the brain and surrounding structures were obtained without and with intravenous contrast. CONTRAST:  61mL MULTIHANCE GADOBENATE DIMEGLUMINE 529 MG/ML IV SOLN COMPARISON:  Noncontrast head CT 1235 hours today. Brain MRIs 02/12/2016 and 07/04/2015 FINDINGS: Brain: Significantly progressed patchy and confluent T2 hyperintensity throughout the pons compared to the 02/12/2016 MRI, in this was largely nonexistent on the January 2017 MRI. These areas demonstrate restricted diffusion (series 4, image 13 and series 400, image 13). There is minimal pontine swelling. No evidence of associated hemorrhage and no significant regional mass effect. Following contrast there is no associated enhancement. Elsewhere there is no other restricted diffusion. Preexisting nonspecific abnormal cerebral white matter T2 and  FLAIR hyperintensity, including somewhat confluent involvement about the splenium of the  corpus callosum (series 7, image 14) appears stable since September but increased since January. Similar T2 and FLAIR hyperintensity in the medial right thalamus appears slightly regressed since September. No cortical encephalomalacia. Cerebellum appears normal. No chronic cerebral blood products. No abnormal enhancement identified. No dural thickening. No ventriculomegaly. Patent basilar cisterns. No midline shift or evidence of intracranial mass lesion. Negative pituitary and cervicomedullary junction. Vascular: Major intracranial vascular flow voids are stable with dominant appearing distal left vertebral artery. Skull and upper cervical spine: Negative. Visualized bone marrow signal is within normal limits. Sinuses/Orbits: Visualized paranasal sinuses and mastoids are stable and well pneumatized. Negative orbit soft tissues. Other: Visible internal auditory structures appear normal. Small right forehead scalp lipoma incidentally re- demonstrated (series 6, image 19). Otherwise negative scalp soft tissues. Incidental adenoid hypertrophy. IMPRESSION: 1. Patchy and confluent abnormal signal in the pons with restricted diffusion and no abnormal enhancement. No associated hemorrhage or mass effect. This most resembles acute pontine infarct. The main differential consideration for this finding in isolation is osmotic demyelination, but see also #2. 2. Superimposed age advanced but nonspecific cerebral white matter and right thalamic signal abnormality, with progression in the splenium of the corpus callosum since January. Top differential considerations include accelerated small vessel ischemia, demyelinating disease, hypercoagulable state, and less likely viral or atypical CNS infection. Electronically Signed   By: Genevie Ann M.D.   On: 04/30/2016 17:34     Medical Consultants:    None.  Anti-Infectives:   none  Subjective:    [name] she relates she still has ataxia.  Objective:    Vitals:    04/30/16 1901 04/30/16 2159 05/01/16 0551 05/01/16 0910  BP: 140/72 (!) 142/79 139/70 (!) 178/94  Pulse: 77 85 81 90  Resp: 16 16 18 18   Temp: 98.8 F (37.1 C) 98.4 F (36.9 C) 98.5 F (36.9 C) 97.8 F (36.6 C)  TempSrc: Oral Oral Oral Axillary  SpO2: 100% 99% 99% 100%  Weight:      Height:        Intake/Output Summary (Last 24 hours) at 05/01/16 1031 Last data filed at 05/01/16 0953  Gross per 24 hour  Intake              460 ml  Output              600 ml  Net             -140 ml   Filed Weights   04/30/16 1148 04/30/16 1901  Weight: 74.8 kg (165 lb) 71.9 kg (158 lb 8.2 oz)    Exam: General exam: In no acute distress. Respiratory system: Good air movement and clear to auscultation. Cardiovascular system: S1 & S2 heard, RRR. No JVD. Gastrointestinal system: Abdomen is nondistended, soft and nontender.  Extremities: No pedal edema. Skin: No rashes, lesions or ulcers Psychiatry: Judgement and insight appear normal. Mood & affect appropriate.  Neurological exam: Finger-to-nose is positive along with Romberg, 3-12 are grossly intact except for rotational nystagmus to the left, muscle strength 5 over 5 in all 4 extremities and symmetrical the tendon reflexes are 2+ symmetrical bilaterally. Speech is fluent  Data Reviewed:    Labs: Basic Metabolic Panel:  Recent Labs Lab 04/30/16 1145 04/30/16 1916 05/01/16 0450  NA 129* 135 135  K 4.1 3.3* 3.6  CL 96* 104 104  CO2 23 22 24   GLUCOSE 597* 236* 272*  BUN 23*  18 18  CREATININE 1.52* 1.25* 1.25*  CALCIUM 9.7 9.2 8.8*   GFR Estimated Creatinine Clearance: 56.4 mL/min (by C-G formula based on SCr of 1.25 mg/dL (H)). Liver Function Tests:  Recent Labs Lab 04/30/16 1145  AST 19  ALT 18  ALKPHOS 64  BILITOT 0.5  PROT 7.8  ALBUMIN 3.9   No results for input(s): LIPASE, AMYLASE in the last 168 hours. No results for input(s): AMMONIA in the last 168 hours. Coagulation profile  Recent Labs Lab  04/30/16 1145  INR 0.96    CBC:  Recent Labs Lab 04/30/16 1145  WBC 4.7  NEUTROABS 2.1  HGB 12.0  HCT 34.6*  MCV 81.4  PLT 318   Cardiac Enzymes: No results for input(s): CKTOTAL, CKMB, CKMBINDEX, TROPONINI in the last 168 hours. BNP (last 3 results) No results for input(s): PROBNP in the last 8760 hours. CBG:  Recent Labs Lab 04/30/16 1435 04/30/16 1541 04/30/16 1719 04/30/16 2104 05/01/16 0750  GLUCAP 510* 238* 187* 218* 196*   D-Dimer: No results for input(s): DDIMER in the last 72 hours. Hgb A1c: No results for input(s): HGBA1C in the last 72 hours. Lipid Profile: No results for input(s): CHOL, HDL, LDLCALC, TRIG, CHOLHDL, LDLDIRECT in the last 72 hours. Thyroid function studies: No results for input(s): TSH, T4TOTAL, T3FREE, THYROIDAB in the last 72 hours.  Invalid input(s): FREET3 Anemia work up: No results for input(s): VITAMINB12, FOLATE, FERRITIN, TIBC, IRON, RETICCTPCT in the last 72 hours. Sepsis Labs:  Recent Labs Lab 04/30/16 1145  WBC 4.7   Microbiology No results found for this or any previous visit (from the past 240 hour(s)).   Medications:   . amLODipine  10 mg Oral Q24H  . enoxaparin (LOVENOX) injection  40 mg Subcutaneous Q24H  . hydrochlorothiazide  25 mg Oral Daily  . insulin aspart  0-15 Units Subcutaneous TID WC  . insulin aspart  0-5 Units Subcutaneous QHS  . insulin aspart  10 Units Subcutaneous TID WC  . insulin detemir  20 Units Subcutaneous BID  . olopatadine  1 drop Both Eyes BID  . pantoprazole  40 mg Oral Daily  . sertraline  50 mg Oral Daily  . sodium chloride flush  3 mL Intravenous Q12H  . tiZANidine  4 mg Oral BID   Continuous Infusions:  Time spent: 25 min   LOS: 1 day   Charlynne Cousins  [facility] Pager [phone]  *Please refer to [email or web address], password [email or web address] to get updated schedule on who will round on this patient, as hospitalists switch teams weekly. If 7PM-7AM, please contact  night-coverage at [email or web address], password [email or web address] for any overnight needs.  05/01/2016, 10:31 AM       ]

## 2016-05-01 NOTE — Progress Notes (Signed)
Inpatient Diabetes Program Recommendations  AACE/ADA: New Consensus Statement on Inpatient Glycemic Control (2015)  Target Ranges:  Prepandial:   less than 140 mg/dL      Peak postprandial:   less than 180 mg/dL (1-2 hours)      Critically ill patients:  140 - 180 mg/dL   Lab Results  Component Value Date   GLUCAP 81 05/01/2016   HGBA1C 11.4 03/23/2016    Review of Glycemic Control  Diabetes history: DM 2 Outpatient Diabetes medications: Levemir 20 units BID, Novolog 10 units TID meal coverage Current orders for Inpatient glycemic control: Levemir 25 units BID, Novolog 10 units TID meal coverage, Novolog Moderate + HS  Inpatient Diabetes Program Recommendations:   Glucose went from 196 to 81 mg/dl with current Novolog Correction and meal coverage dose. May want to decrease either meal coverage or correction scale while inpatient.  Thanks,  Tama Headings RN, MSN, [facility] Inpatient Diabetes Coordinator Team Pager [phone] (8a-5p)

## 2016-05-02 ENCOUNTER — Inpatient Hospital Stay (HOSPITAL_COMMUNITY): Payer: Medicaid Other

## 2016-05-02 DIAGNOSIS — G939 Disorder of brain, unspecified: Secondary | ICD-10-CM

## 2016-05-02 DIAGNOSIS — I6789 Other cerebrovascular disease: Secondary | ICD-10-CM

## 2016-05-02 DIAGNOSIS — R2681 Unsteadiness on feet: Secondary | ICD-10-CM

## 2016-05-02 DIAGNOSIS — R4781 Slurred speech: Secondary | ICD-10-CM | POA: Insufficient documentation

## 2016-05-02 LAB — LIPID PANEL
CHOL/HDL RATIO: 3 ratio
CHOLESTEROL: 143 mg/dL (ref 0–200)
HDL: 47 mg/dL (ref >40)
LDL Cholesterol: 60 mg/dL (ref 0–99)
TRIGLYCERIDES: 179 mg/dL — AB (ref <150)
VLDL: 36 mg/dL (ref 0–40)

## 2016-05-02 LAB — GLUCOSE, CAPILLARY
GLUCOSE-CAPILLARY: 146 mg/dL — AB (ref 65–99)
GLUCOSE-CAPILLARY: 83 mg/dL (ref 65–99)
Glucose-Capillary: 89 mg/dL (ref 65–99)
Glucose-Capillary: 116 mg/dL — ABNORMAL HIGH (ref 65–99)
Glucose-Capillary: 80 mg/dL (ref 65–99)

## 2016-05-02 LAB — ECHOCARDIOGRAM COMPLETE
HEIGHTINCHES: 63.5 in
Weight: 2536.17 oz

## 2016-05-02 MED ORDER — OLOPATADINE HCL 0.1 % OP SOLN
1.0000 [drp] | Freq: Two times a day (BID) | OPHTHALMIC | Status: DC | PRN
Start: 2016-05-02 — End: 2016-05-03

## 2016-05-02 MED ORDER — GADOBENATE DIMEGLUMINE 529 MG/ML IV SOLN
20.0000 mL | Freq: Once | INTRAVENOUS | Status: AC
  Administered 2016-05-02: 15 mL via INTRAVENOUS

## 2016-05-02 MED ORDER — TIZANIDINE HCL 4 MG PO TABS
4.0000 mg | ORAL_TABLET | Freq: Two times a day (BID) | ORAL | Status: DC | PRN
Start: 2016-05-02 — End: 2016-05-03

## 2016-05-02 MED ORDER — SERTRALINE HCL 50 MG PO TABS
50.0000 mg | ORAL_TABLET | Freq: Every day | ORAL | Status: DC
  Administered 2016-05-02: 50 mg via ORAL
  Filled 2016-05-02: qty 1

## 2016-05-02 NOTE — Progress Notes (Signed)
  Echocardiogram 2D Echocardiogram has been performed.  Diamond Nickel 05/02/2016, 12:29 PM

## 2016-05-02 NOTE — Progress Notes (Signed)
Subjective: Desires to go home. No complaints. Using walker to ambulate.   Exam: Vitals:   05/02/16 0615 05/02/16 1034  BP: 138/78 (!) 144/96  Pulse: 88 85  Resp: 18 16  Temp: 99.3 F (37.4 C) 98 F (36.7 C)       Gen: In bed, NAD MS: awake, alert, interactive an appropriate.  NB:ZXYDS, EOMI, face symmetric.  Motor: She has good strength to confrontation.  Sensory:intact to LT She does have mild dysmetria on FNF bilaterally,   Pertinent Labs/Diagnostics: MRA  Head and neck pending    Impression: This is a 45 year old female with progressive gait imbalance and dysarthria over approximately 1 week. MRI showed consistent with CPM. As noted in previous notes this has been reported even without electrolyte fluctuations in cases of hyperosmolar states. At this time she is awaiting her MRA of head and neck to confirm that there is no severe basilar stenosis or thrombosis. If this is negative no further neurological recommendations other than avoiding large swings and osmolality. Patient sees Dr. Lavell Anchors as neurology outpatient and should follow up with her on discharge.     Etta Quill PA-C [facility] [phone] 05/02/2016, 10:51 AM

## 2016-05-02 NOTE — Progress Notes (Signed)
PT Cancellation Note  Patient Details Name: Christina Orozco MRN: [id number] DOB: 10/19/1970   Cancelled Treatment:    Reason Eval/Treat Not Completed: Other (comment) (Pt eating breakfast, will continue efforts per POC.  )   Emmanuel Gruenhagen Eli Hose 05/02/2016, 9:30 AM  Governor Rooks, PTA pager [phone]

## 2016-05-02 NOTE — Progress Notes (Signed)
Physical Therapy Treatment Patient Details Name: Christina Orozco MRN: [id number] DOB: June 24, 1970 Today's Date: 05/02/2016    History of Present Illness Christina Orozco is a 45 y.o. female with a history of DM2, GERD, HTN, neuropathy. Patient presented to the emergency Department for evaluation of gait instability has been worsening over the past week with slurred speech and feeling intoxicated. 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     PT Comments    The pt is making progress toward her goals and ambulated further today, but she is still limited by impaired balance and strength.  She will benefit from PT to address these deficits.  Continue with POC next session with emphasis on balance training.  Follow Up Recommendations  Home health PT;Supervision/Assistance - 24 hour     Equipment Recommendations  Rolling walker with 5" wheels    Recommendations for Other Services       Precautions / Restrictions Precautions Precautions: Fall Precaution Comments: Pt reports she fell one time at work pta Restrictions Weight Bearing Restrictions: No    Mobility  Bed Mobility Overal bed mobility: Independent                Transfers Overall transfer level: Modified independent Equipment used: Rolling walker (2 wheeled) Transfers: Sit to/from Stand Sit to Stand: Modified independent (Device/Increase time)         General transfer comment: Pt needed verbal cues for safety.  Ambulation/Gait Ambulation/Gait assistance: Min guard;Modified independent (Device/Increase time) (Min guard initially when pt reported feeling unsteady.) Ambulation Distance (Feet): 200 Feet Assistive device: Rolling walker (2 wheeled) Gait Pattern/deviations: Step-through pattern   Gait velocity interpretation: Below normal speed for age/gender General Gait Details: Pt with no apparent LOB,  but did report feeling unsteady x 2.   Stairs            Wheelchair Mobility    Modified Rankin (Stroke Patients Only)       Balance     Sitting balance-Leahy Scale: Good     Standing balance support: Bilateral upper extremity supported;During functional activity Standing balance-Leahy Scale: Poor Standing balance comment: reliant on UE support for balance.  Pt LOB while standing with eyes closed. Single Leg Stance - Right Leg: 10 (10 secs x 2) Single Leg Stance - Left Leg: 10 (10 secs x 2)           High Level Balance Comments: Pt needed UE support during balance activities.    Cognition Arousal/Alertness: Awake/alert Behavior During Therapy: WFL for tasks assessed/performed Overall Cognitive Status: Within Functional Limits for tasks assessed                      Exercises Total Joint Exercises Long Arc Quad: AROM;Both;10 reps;Seated Marching in Standing: AROM;Both;10 reps;Seated General Exercises - Lower Extremity Toe Raises: AROM;Both;5 reps;Standing (Pt lost balance while performing toe raises.)    General Comments        Pertinent Vitals/Pain Pain Assessment: No/denies pain    Home Living                      Prior Function            PT Goals (current goals can now be found in the care plan section) Acute Rehab PT Goals Patient Stated Goal: to go home and back to work PT Goal Formulation: With patient Time For Goal Achievement: 05/15/16 Potential  to Achieve Goals: Good Progress towards PT goals: Progressing toward goals    Frequency    Min 5X/week      PT Plan      Co-evaluation             End of Session Equipment Utilized During Treatment: Gait belt Activity Tolerance: Patient tolerated treatment well Patient left: in bed;with call bell/phone within reach;with bed alarm set     Time:  -     Charges:                       G Codes:      [name] 2016-05-06, 12:11 PM  Rito Ehrlich. [place],  [place]

## 2016-05-02 NOTE — Progress Notes (Signed)
Occupational Therapy Treatment Patient Details Name: Christina Orozco MRN: [id number] DOB: 12-27-1970 Today's Date: 05/02/2016    History of present illness Christina Orozco is a 45 y.o. female with a history of DM2, GERD, HTN, neuropathy. Patient presented to the emergency Department for evaluation of gait instability has been worsening over the past week with slurred speech and feeling intoxicated. 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    OT comments  Pt making progress with OT today with tub transfers (down into and out of). She is moving better and with less balance issues today. She will continue to benefit from acute OT.  Follow Up Recommendations  Other (comment) (Other (comment) (would benefit from [facility], but with Medicaid--do not think she can get. Will need transportation once family leaves post Thanksgiving))    Equipment Recommendations  Tub/shower seat       Precautions / Restrictions Precautions Precautions: Fall Precaution Comments: Pt reports she fell one time at work pta Restrictions Weight Bearing Restrictions: No       Mobility Bed Mobility Overal bed mobility: Independent                Transfers Overall transfer level: Needs assistance Equipment used: None Transfers: Sit to/from Stand Sit to Stand: Modified independent (Device/Increase time)         General transfer comment: minguard A to transfer from bed to W/C 3 feet away without AD    Balance Overall balance assessment: Needs assistance Sitting-balance support: Feet supported;No upper extremity supported Sitting balance-Leahy Scale: Good     Standing balance support: No upper extremity supported Standing balance-Leahy Scale: Fair Standing balance comment: for static standing Single Leg Stance - Right Leg: 10 (10 secs x 2) Single Leg Stance - Left Leg: 10 (10 secs x 2)            High Level Balance Comments: Pt needed UE support during balance activities.   ADL Overall ADL's : Needs assistance/impaired                                 Tub/ Shower Transfer: Supervision/safety (getting down into and out of tub)                      Cognition   Behavior During Therapy: WFL for tasks assessed/performed Overall Cognitive Status: Within Functional Limits for tasks assessed                         Exercises Total Joint Exercises Long Arc Quad: AROM;Both;10 reps;Seated Marching in Standing: AROM;Both;10 reps;Seated General Exercises - Lower Extremity Toe Raises: AROM;Both;5 reps;Standing (Pt lost balance while performing toe raises.)           Pertinent Vitals/ Pain       Pain Assessment: No/denies pain         Frequency  Min 3X/week        Progress Toward Goals  OT Goals(current goals can now be found in the care plan section)  Progress towards OT goals: Progressing toward goals  Acute Rehab OT Goals Patient Stated Goal: to go home and back to work  Plan Discharge plan remains appropriate       End of Session Equipment Utilized During Treatment:  (none)   Activity Tolerance Patient tolerated treatment well   Patient  Left in bed;with call bell/phone within reach   Nurse Communication          Time: 1355-1407 OT Time Calculation (min): 12 min  Charges: OT General Charges $OT Visit: 1 Procedure OT Treatments $Self Care/Home Management : 8-22 mins  Almon Register [phone] 05/02/2016, 2:16 PM

## 2016-05-02 NOTE — Progress Notes (Signed)
[facility] PROGRESS NOTE    Progress Note  Christina Orozco  YJE:[id number] DOB: [birth year] DOA: 04/30/2016 PCP: Dorena Dew, FNP     Brief Narrative:   Christina Orozco is an 45 y.o. female past medical history of diabetes type 2, hypertension and peripheral neuropathy who presents to the ED with gait instability she relates polyuria and polydipsia denies any fever chills abdominal pain cough or shortness of breath. It was discussed with neurology Dr. Laverta Baltimore who recommended an MRI, and ED she was found to have a glucose of 600.  Assessment/Plan:  Type 2 diabetes mellitus with hyperosmolar nonketotic hyperglycemia ([place]): Initially started on insulin drip, now on long-acting insulin and sliding scale. We'll continue to titrate long-acting insulin. Check A1c.  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 She cannot be on aspirin due intraocular bleeding, as per patient she was told by her ophthalmologist. Consult Neurology, they rec MRA of head and neck. We'll allow permissive hypertension. HgbA1c, fasting lipid panel HDL > 40, LDL 60. PT rec home health PT with 24 hr supervision. Echocardiogram  pending  Essential hypertension: Allow permissive HTN.  Chronic kidney disease (CKD), stage III (moderate): Baseline continue to monitor.   DVT prophylaxis: Lovenox Family Communication:none Disposition Plan/Barrier to D/C: home in 1-2 days Code Status:     Code Status Orders        Start     Ordered   04/30/16 1901  Full code  Continuous     04/30/16 1900    Code Status History    Date Active Date Inactive Code Status Order ID Comments User Context   04/30/2016  5:47 PM 04/30/2016  7:00 PM Full Code [id number]  Truett Mainland, DO ED        IV Access:    Peripheral IV   Procedures and diagnostic studies:   Ct Head Wo Contrast  Result Date: 04/30/2016 CLINICAL DATA:  45 year old  female with dizziness and slurred speech. EXAM: CT HEAD WITHOUT CONTRAST TECHNIQUE: Contiguous axial images were obtained from the base of the skull through the vertex without intravenous contrast. COMPARISON:  02/12/2016 MR and 12/22/2003 CT FINDINGS: Brain: No evidence of acute infarction, hemorrhage, hydrocephalus, extra-axial collection or mass lesion/mass effect. Mild white matter hypodensities are unchanged. Vascular: No hyperdense vessel or unexpected calcification. Skull: Normal. Negative for fracture or focal lesion. Sinuses/Orbits: No acute finding. Other: None. IMPRESSION: No evidence of acute intracranial abnormality. Unchanged mild white matter disease. Electronically Signed   By: Margarette Canada M.D.   On: 04/30/2016 12:47   [name] And Wo Contrast  Result Date: 04/30/2016 CLINICAL DATA:  45 year old female with slurred speech, dizziness, gait instability for 6 days. Initial encounter. EXAM: MRI HEAD WITHOUT AND WITH CONTRAST TECHNIQUE: Multiplanar, multiecho pulse sequences of the brain and surrounding structures were obtained without and with intravenous contrast. CONTRAST:  28mL MULTIHANCE GADOBENATE DIMEGLUMINE 529 MG/ML IV SOLN COMPARISON:  Noncontrast head CT 1235 hours today. Brain MRIs 02/12/2016 and 07/04/2015 FINDINGS: Brain: Significantly progressed patchy and confluent T2 hyperintensity throughout the pons compared to the 02/12/2016 MRI, in this was largely nonexistent on the January 2017 MRI. These areas demonstrate restricted diffusion (series 4, image 13 and series 400, image 13). There is minimal pontine swelling. No evidence of associated hemorrhage and no significant regional mass effect. Following contrast there is no associated enhancement. Elsewhere there is no other restricted diffusion. Preexisting nonspecific abnormal cerebral white matter T2 and FLAIR hyperintensity,  including somewhat confluent involvement about the splenium of the corpus callosum (series 7, image 14)  appears stable since September but increased since January. Similar T2 and FLAIR hyperintensity in the medial right thalamus appears slightly regressed since September. No cortical encephalomalacia. Cerebellum appears normal. No chronic cerebral blood products. No abnormal enhancement identified. No dural thickening. No ventriculomegaly. Patent basilar cisterns. No midline shift or evidence of intracranial mass lesion. Negative pituitary and cervicomedullary junction. Vascular: Major intracranial vascular flow voids are stable with dominant appearing distal left vertebral artery. Skull and upper cervical spine: Negative. Visualized bone marrow signal is within normal limits. Sinuses/Orbits: Visualized paranasal sinuses and mastoids are stable and well pneumatized. Negative orbit soft tissues. Other: Visible internal auditory structures appear normal. Small right forehead scalp lipoma incidentally re- demonstrated (series 6, image 19). Otherwise negative scalp soft tissues. Incidental adenoid hypertrophy. IMPRESSION: 1. Patchy and confluent abnormal signal in the pons with restricted diffusion and no abnormal enhancement. No associated hemorrhage or mass effect. This most resembles acute pontine infarct. The main differential consideration for this finding in isolation is osmotic demyelination, but see also #2. 2. Superimposed age advanced but nonspecific cerebral white matter and right thalamic signal abnormality, with progression in the splenium of the corpus callosum since January. Top differential considerations include accelerated small vessel ischemia, demyelinating disease, hypercoagulable state, and less likely viral or atypical CNS infection. Electronically Signed   By: Genevie Ann M.D.   On: 04/30/2016 17:34     Medical Consultants:    None.  Anti-Infectives:   none  Subjective:    Christina Orozco she relates she still has ataxia.  Objective:    Vitals:   05/01/16 2154 05/02/16 0319  05/02/16 0615 05/02/16 1034  BP: (!) 164/75 138/68 138/78 (!) 144/96  Pulse:  78 88 85  Resp:  19 18 16   Temp:  99.3 F (37.4 C) 99.3 F (37.4 C) 98 F (36.7 C)  TempSrc:  Oral Oral Oral  SpO2:  100% 100% 100%  Weight:      Height:        Intake/Output Summary (Last 24 hours) at 05/02/16 1218 Last data filed at 05/02/16 1032  Gross per 24 hour  Intake              246 ml  Output             1550 ml  Net            -1304 ml   Filed Weights   04/30/16 1148 04/30/16 1901  Weight: 74.8 kg (165 lb) 71.9 kg (158 lb 8.2 oz)    Exam: General exam: In no acute distress. Respiratory system: Good air movement and clear to auscultation. Cardiovascular system: S1 & S2 heard, RRR. No JVD. Gastrointestinal system: Abdomen is nondistended, soft and nontender.  Extremities: No pedal edema. Skin: No rashes, lesions or ulcers Psychiatry: Judgement and insight appear normal. Mood & affect appropriate.  Neurological exam: Finger-to-nose is positive along with Romberg, 3-12 are grossly intact except for rotational nystagmus to the left, muscle strength 5 over 5 in all 4 extremities and symmetrical the tendon reflexes are 2+ symmetrical bilaterally. Speech is fluent  Data Reviewed:    Labs: Basic Metabolic Panel:  Recent Labs Lab 04/30/16 1145 04/30/16 1916 05/01/16 0450  NA 129* 135 135  K 4.1 3.3* 3.6  CL 96* 104 104  CO2 23 22 24   GLUCOSE 597* 236* 272*  BUN 23* 18 18  CREATININE 1.52* 1.25* 1.25*  CALCIUM 9.7 9.2 8.8*   GFR Estimated Creatinine Clearance: 56.4 mL/min (by C-G formula based on SCr of 1.25 mg/dL (H)). Liver Function Tests:  Recent Labs Lab 04/30/16 1145  AST 19  ALT 18  ALKPHOS 64  BILITOT 0.5  PROT 7.8  ALBUMIN 3.9   No results for input(s): LIPASE, AMYLASE in the last 168 hours. No results for input(s): AMMONIA in the last 168 hours. Coagulation profile  Recent Labs Lab 04/30/16 1145  INR 0.96    CBC:  Recent Labs Lab 04/30/16 1145    WBC 4.7  NEUTROABS 2.1  HGB 12.0  HCT 34.6*  MCV 81.4  PLT 318   Cardiac Enzymes: No results for input(s): CKTOTAL, CKMB, CKMBINDEX, TROPONINI in the last 168 hours. BNP (last 3 results) No results for input(s): PROBNP in the last 8760 hours. CBG:  Recent Labs Lab 05/01/16 0750 05/01/16 1147 05/01/16 1720 05/01/16 2152 05/02/16 0748  GLUCAP 196* 81 148* 131* 89   D-Dimer: No results for input(s): DDIMER in the last 72 hours. Hgb A1c: No results for input(s): HGBA1C in the last 72 hours. Lipid Profile:  Recent Labs  05/02/16 0605  CHOL 143  HDL 47  LDLCALC 60  TRIG 179*  CHOLHDL 3.0   Thyroid function studies: No results for input(s): TSH, T4TOTAL, T3FREE, THYROIDAB in the last 72 hours.  Invalid input(s): FREET3 Anemia work up: No results for input(s): VITAMINB12, FOLATE, FERRITIN, TIBC, IRON, RETICCTPCT in the last 72 hours. Sepsis Labs:  Recent Labs Lab 04/30/16 1145  WBC 4.7   Microbiology No results found for this or any previous visit (from the past 240 hour(s)).   Medications:   . amLODipine  10 mg Oral Q24H  . atorvastatin  40 mg Oral q1800  . enoxaparin (LOVENOX) injection  40 mg Subcutaneous Q24H  . hydrochlorothiazide  25 mg Oral Daily  . insulin aspart  0-15 Units Subcutaneous TID WC  . insulin aspart  0-5 Units Subcutaneous QHS  . insulin aspart  10 Units Subcutaneous TID WC  . insulin detemir  25 Units Subcutaneous BID  . pantoprazole  40 mg Oral Daily  . sertraline  50 mg Oral QHS  . sodium chloride flush  3 mL Intravenous Q12H   Continuous Infusions:  Time spent: 25 min   LOS: 2 days   Charlynne Cousins  [facility] Pager [phone]  *Please refer to [email or web address], password [email or web address] to get updated schedule on who will round on this patient, as hospitalists switch teams weekly. If 7PM-7AM, please contact night-coverage at [email or web address], password [email or web address] for any overnight needs.  05/02/2016, 12:18 PM       ]

## 2016-05-03 LAB — HEMOGLOBIN A1C
Hgb A1c MFr Bld: 14.7 % — ABNORMAL HIGH (ref 4.8–5.6)
Hgb A1c MFr Bld: 14.1 % — ABNORMAL HIGH (ref 4.8–5.6)
MEAN PLASMA GLUCOSE: 375 mg/dL
Mean Plasma Glucose: 358 mg/dL

## 2016-05-03 LAB — GLUCOSE, CAPILLARY
GLUCOSE-CAPILLARY: 306 mg/dL — AB (ref 65–99)
GLUCOSE-CAPILLARY: 296 mg/dL — AB (ref 65–99)

## 2016-05-03 MED ORDER — ATORVASTATIN CALCIUM 40 MG PO TABS: 40.0000 mg | ORAL_TABLET | Freq: Every day | ORAL | 3 refills | Status: DC

## 2016-05-03 NOTE — Discharge Summary (Signed)
Physician Discharge Summary  Christina Orozco NWG:[id number] DOB: 1970-06-23 DOA: 04/30/2016  PCP: Dorena Dew, FNP  Admit date: 04/30/2016 Discharge date: 05/03/2016  Admitted From: home Disposition:  Home  Recommendations for Outpatient Follow-up:  1. Follow up with PCP in 1-2 weeks 2. Please obtain BMP/CBC in one week  Home Health:no Equipment/Devices:None  Discharge Condition:stable CODE STATUS:full Diet recommendation: Heart Healthy  Brief/Interim Summary: 45 year old with past medical history of brittle diabetes type 2, essential hypertension and peripheral neuropathy comes to the emergency room for evaluation of her gait instability which has been worse over the past week with/PE/and filling intoxicated.  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 She was switched to home regimen plus sliding scale and her sugars have been ranging 132-96. Her A1c was 14.1.  Essential hypertension No changes made to her medication.  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 Physical therapy evaluated the patient and recommended home with physical therapy and 24-hour supervision.  Chronic kidney disease (CKD), stage III (moderate) Renal function remained at baseline no changes were made.    Discharge Instructions  Discharge Instructions    Diet - low sodium heart healthy    Complete by:  As directed    Increase activity slowly    Complete by:  As directed        Medication List    TAKE these medications   albuterol 108 (90 Base) MCG/ACT inhaler Commonly known as:  PROVENTIL  HFA;VENTOLIN HFA Inhale 1-2 puffs into the lungs every 6 (six) hours as needed for wheezing or shortness of breath.   amLODipine 10 MG tablet Commonly known as:  NORVASC Take 1 tablet (10 mg total) by mouth daily.   atorvastatin 40 MG tablet Commonly known as:  LIPITOR Take 1 tablet (40 mg total) by mouth daily at 6 PM.   hydrochlorothiazide 25 MG tablet Commonly known as:  HYDRODIURIL Take 1 tablet (25 mg total) by mouth daily.   insulin aspart 100 UNIT/ML FlexPen Commonly known as:  NOVOLOG Inject 10 Units into the skin 3 (three) times daily with meals.   insulin detemir 100 UNIT/ML injection Commonly known as:  LEVEMIR Inject 0.15 mLs (15 Units total) into the skin 2 (two) times daily. What changed:  how much to take   meclizine 25 MG tablet Commonly known as:  ANTIVERT Take 25 mg by mouth 3 (three) times daily as needed for dizziness.   Olopatadine HCl 0.2 % Soln Commonly known as:  PATADAY Apply 1 drop to eye daily. What changed:  when to take this  reasons to take this   omeprazole 40 MG capsule Commonly known as:  PRILOSEC Take 1 capsule (40 mg total) by mouth daily. Reported on 06/08/2015   sertraline 50 MG tablet Commonly known as:  ZOLOFT Take 50 mg by mouth at bedtime. Takes PRN   tiZANidine 4 MG tablet Commonly known as:  ZANAFLEX Take 4 mg by mouth 2 (two) times daily as needed for muscle spasms.            Durable Medical Equipment        Start     Ordered  05/03/16 0835  For home use only DME Shower stool  Once     05/03/16 0835   05/03/16 0834  For home use only DME Walker rolling  Once    Question:  Patient needs a walker to treat with the following condition  Answer:  CVA (cerebral vascular accident) [facility])   05/03/16 0835     Follow-up Information    [facility] Follow up.   Why:  home health PT,OT, NA  arranged Contact information: [address] [place] [address] [phone]        Inc[facility] -  Dme [facility] Follow up.   Why:  Rolling walker and shower seat to be delivered to bedside prior to discharge Contact information: [place] [address] [phone]          Allergies  Allergen Reactions  . Aspirin Other (See Comments)    Not to use due to laser eye surgery  . Lisinopril Swelling and Other (See Comments)    Facial swelling   . Penicillins Hives    Has patient had a PCN reaction causing immediate rash, facial/tongue/throat swelling, SOB or lightheadedness with hypotension: Noyes Has patient had a PCN reaction causing severe rash involving mucus membranes or skin necrosis: Nono Has patient had a PCN reaction that required hospitalization Nogiven in hospital Has patient had a PCN reaction occurring within the last 10 years: Nono If all of the above answers are "NO", then may proceed wit    Consultations:  Neurology   Procedures/Studies: Ct Head Wo Contrast  Result Date: 04/30/2016 CLINICAL DATA:  45 year old female with dizziness and slurred speech. EXAM: CT HEAD WITHOUT CONTRAST TECHNIQUE: Contiguous axial images were obtained from the base of the skull through the vertex without intravenous contrast. COMPARISON:  02/12/2016 MR and 12/22/2003 CT FINDINGS: Brain: No evidence of acute infarction, hemorrhage, hydrocephalus, extra-axial collection or mass lesion/mass effect. Mild white matter hypodensities are unchanged. Vascular: No hyperdense vessel or unexpected calcification. Skull: Normal. Negative for fracture or focal lesion. Sinuses/Orbits: No acute finding. Other: None. IMPRESSION: No evidence of acute intracranial abnormality. Unchanged mild white matter disease. Electronically Signed   By: Margarette Canada M.D.   On: 04/30/2016 12:47   Mr Jodene Nam Head Wo Contrast  Result Date: 05/02/2016 CLINICAL DATA:  45 y/o F; progressive gait imbalance and dysarthria over 1 week. EXAM: MRA NECK WITHOUT AND WITH CONTRAST MRA HEAD WITHOUT CONTRAST  TECHNIQUE: Multiplanar and multiecho pulse sequences of the neck were obtained without and with intravenous contrast. Angiographic images of the neck were obtained using MRA technique without and with intravenous contast.; Angiographic images of the Circle of Willis were obtained using MRA technique without intravenous contrast. CONTRAST:  74mL MULTIHANCE GADOBENATE DIMEGLUMINE 529 MG/ML IV SOLN COMPARISON:  04/30/2016 MRI of the head. FINDINGS: MRA NECK FINDINGS Aortic arch: Patent. Common origin of left common carotid artery and right brachiocephalic artery. Right common carotid artery: Patent. Right internal carotid artery: Patent. Right vertebral artery: Patent Left common carotid artery: Patent. Left Internal carotid artery: Patent Left Vertebral artery: Patent. There is no evidence of high-grade stenosis by NASCET criteria, dissection, or aneurysm. MRA HEAD FINDINGS Internal carotid arteries:  Patent. Anterior cerebral arteries:  Patent. Middle cerebral arteries: Patent. Anterior communicating artery: Patent. Posterior communicating arteries: Patent left. No right identified, likely hypoplastic or absent. Posterior cerebral arteries:  Patent. Basilar artery:  Patent. Vertebral arteries:  Patent. No evidence of high-grade stenosis, large vessel occlusion, or aneurysm. IMPRESSION: 1.  Normal MRA of the head. Specifically no abnormality of posterior circulation. No evidence of high-grade stenosis, large vessel occlusion, or aneurysm. 2. Normal MRA of the neck with and without contrast. No evidence for high-grade stenosis by NASCET criteria, dissection, or aneurysm. Electronically Signed   By: Kristine Garbe M.D.   On: 05/02/2016 23:45   Mr Jodene Nam Neck W Wo Contrast  Result Date: 05/02/2016 CLINICAL DATA:  45 y/o F; progressive gait imbalance and dysarthria over 1 week. EXAM: MRA NECK WITHOUT AND WITH CONTRAST MRA HEAD WITHOUT CONTRAST TECHNIQUE: Multiplanar and multiecho pulse sequences of the neck  were obtained without and with intravenous contrast. Angiographic images of the neck were obtained using MRA technique without and with intravenous contast.; Angiographic images of the Circle of Willis were obtained using MRA technique without intravenous contrast. CONTRAST:  87mL MULTIHANCE GADOBENATE DIMEGLUMINE 529 MG/ML IV SOLN COMPARISON:  04/30/2016 MRI of the head. FINDINGS: MRA NECK FINDINGS Aortic arch: Patent. Common origin of left common carotid artery and right brachiocephalic artery. Right common carotid artery: Patent. Right internal carotid artery: Patent. Right vertebral artery: Patent Left common carotid artery: Patent. Left Internal carotid artery: Patent Left Vertebral artery: Patent. There is no evidence of high-grade stenosis by NASCET criteria, dissection, or aneurysm. MRA HEAD FINDINGS Internal carotid arteries:  Patent. Anterior cerebral arteries:  Patent. Middle cerebral arteries: Patent. Anterior communicating artery: Patent. Posterior communicating arteries: Patent left. No right identified, likely hypoplastic or absent. Posterior cerebral arteries:  Patent. Basilar artery:  Patent. Vertebral arteries:  Patent. No evidence of high-grade stenosis, large vessel occlusion, or aneurysm. IMPRESSION: 1. Normal MRA of the head. Specifically no abnormality of posterior circulation. No evidence of high-grade stenosis, large vessel occlusion, or aneurysm. 2. Normal MRA of the neck with and without contrast. No evidence for high-grade stenosis by NASCET criteria, dissection, or aneurysm. Electronically Signed   By: Kristine Garbe M.D.   On: 05/02/2016 23:45   [name] And Wo Contrast  Result Date: 04/30/2016 CLINICAL DATA:  45 year old female with slurred speech, dizziness, gait instability for 6 days. Initial encounter. EXAM: MRI HEAD WITHOUT AND WITH CONTRAST TECHNIQUE: Multiplanar, multiecho pulse sequences of the brain and surrounding structures were obtained without and with  intravenous contrast. CONTRAST:  37mL MULTIHANCE GADOBENATE DIMEGLUMINE 529 MG/ML IV SOLN COMPARISON:  Noncontrast head CT 1235 hours today. Brain MRIs 02/12/2016 and 07/04/2015 FINDINGS: Brain: Significantly progressed patchy and confluent T2 hyperintensity throughout the pons compared to the 02/12/2016 MRI, in this was largely nonexistent on the January 2017 MRI. These areas demonstrate restricted diffusion (series 4, image 13 and series 400, image 13). There is minimal pontine swelling. No evidence of associated hemorrhage and no significant regional mass effect. Following contrast there is no associated enhancement. Elsewhere there is no other restricted diffusion. Preexisting nonspecific abnormal cerebral white matter T2 and FLAIR hyperintensity, including somewhat confluent involvement about the splenium of the corpus callosum (series 7, image 14) appears stable since September but increased since January. Similar T2 and FLAIR hyperintensity in the medial right thalamus appears slightly regressed since September. No cortical encephalomalacia. Cerebellum appears normal. No chronic cerebral blood products. No abnormal enhancement identified. No dural thickening. No ventriculomegaly. Patent basilar cisterns. No midline shift or evidence of intracranial mass lesion. Negative pituitary and cervicomedullary junction. Vascular: Major intracranial vascular flow voids are stable with dominant appearing distal left vertebral artery. Skull and upper cervical spine: Negative. Visualized bone marrow signal is within normal limits. Sinuses/Orbits: Visualized paranasal sinuses and mastoids are stable  and well pneumatized. Negative orbit soft tissues. Other: Visible internal auditory structures appear normal. Small right forehead scalp lipoma incidentally re- demonstrated (series 6, image 19). Otherwise negative scalp soft tissues. Incidental adenoid hypertrophy. IMPRESSION: 1. Patchy and confluent abnormal signal in the pons  with restricted diffusion and no abnormal enhancement. No associated hemorrhage or mass effect. This most resembles acute pontine infarct. The main differential consideration for this finding in isolation is osmotic demyelination, but see also #2. 2. Superimposed age advanced but nonspecific cerebral white matter and right thalamic signal abnormality, with progression in the splenium of the corpus callosum since January. Top differential considerations include accelerated small vessel ischemia, demyelinating disease, hypercoagulable state, and less likely viral or atypical CNS infection. Electronically Signed   By: Genevie Ann M.D.   On: 04/30/2016 17:34      Subjective: She relates her symptoms are much better compared to yesterday.  Discharge Exam: Vitals:   05/02/16 2020 05/03/16 0605  BP: (!) 152/71 (!) 147/79  Pulse: 79 90  Resp: 18 17  Temp: 98.3 F (36.8 C) 98.3 F (36.8 C)   Vitals:   05/02/16 1034 05/02/16 1331 05/02/16 2020 05/03/16 0605  BP: (!) 144/96 (!) 151/97 (!) 152/71 (!) 147/79  Pulse: 85 75 79 90  Resp: 16 16 18 17   Temp: 98 F (36.7 C) 98.1 F (36.7 C) 98.3 F (36.8 C) 98.3 F (36.8 C)  TempSrc: Oral Oral Oral Oral  SpO2: 100% 100% 100% 100%  Weight:      Height:        General: Pt is alert, awake, not in acute distress Cardiovascular: RRR, S1/S2 +, no rubs, no gallops Respiratory: CTA bilaterally, no wheezing, no rhonchi Abdominal: Soft, NT, ND, bowel sounds + Extremities: no edema, no cyanosis    The results of significant diagnostics from this hospitalization (including imaging, microbiology, ancillary and laboratory) are listed below for reference.     Microbiology: No results found for this or any previous visit (from the past 240 hour(s)).   Labs: BNP (last 3 results) No results for input(s): BNP in the last 8760 hours. Basic Metabolic Panel:  Recent Labs Lab 04/30/16 1145 04/30/16 1916 05/01/16 0450  NA 129* 135 135  K 4.1 3.3* 3.6   CL 96* 104 104  CO2 23 22 24   GLUCOSE 597* 236* 272*  BUN 23* 18 18  CREATININE 1.52* 1.25* 1.25*  CALCIUM 9.7 9.2 8.8*   Liver Function Tests:  Recent Labs Lab 04/30/16 1145  AST 19  ALT 18  ALKPHOS 64  BILITOT 0.5  PROT 7.8  ALBUMIN 3.9   No results for input(s): LIPASE, AMYLASE in the last 168 hours. No results for input(s): AMMONIA in the last 168 hours. CBC:  Recent Labs Lab 04/30/16 1145  WBC 4.7  NEUTROABS 2.1  HGB 12.0  HCT 34.6*  MCV 81.4  PLT 318   Cardiac Enzymes: No results for input(s): CKTOTAL, CKMB, CKMBINDEX, TROPONINI in the last 168 hours. BNP: Invalid input(s): POCBNP CBG:  Recent Labs Lab 05/02/16 1526 05/02/16 1758 05/02/16 2320 05/03/16 0603 05/03/16 0818  GLUCAP 146* 83 80 306* 296*   D-Dimer No results for input(s): DDIMER in the last 72 hours. Hgb A1c  Recent Labs  05/02/16 0605 05/02/16 1315  HGBA1C 14.7* 14.1*   Lipid Profile  Recent Labs  05/02/16 0605  CHOL 143  HDL 47  LDLCALC 60  TRIG 179*  CHOLHDL 3.0   Thyroid function studies No results for input(s): TSH, T4TOTAL,  T3FREE, THYROIDAB in the last 72 hours.  Invalid input(s): FREET3 Anemia work up No results for input(s): VITAMINB12, FOLATE, FERRITIN, TIBC, IRON, RETICCTPCT in the last 72 hours. Urinalysis    Component Value Date/Time   COLORURINE YELLOW 04/30/2016 1435   APPEARANCEUR CLEAR 04/30/2016 1435   LABSPEC 1.031 (H) 04/30/2016 1435   PHURINE 6.0 04/30/2016 1435   GLUCOSEU >1000 (A) 04/30/2016 1435   HGBUR NEGATIVE 04/30/2016 1435   BILIRUBINUR NEGATIVE 04/30/2016 1435   KETONESUR NEGATIVE 04/30/2016 1435   PROTEINUR NEGATIVE 04/30/2016 1435   UROBILINOGEN 0.2 11/16/2015 1412   NITRITE NEGATIVE 04/30/2016 1435   LEUKOCYTESUR NEGATIVE 04/30/2016 1435   Sepsis Labs Invalid input(s): PROCALCITONIN,  WBC,  LACTICIDVEN Microbiology No results found for this or any previous visit (from the past 240 hour(s)).   Time coordinating  discharge: Over 30 minutes  SIGNED:   Charlynne Cousins, MD  [facility] 05/03/2016, 9:16 AM Pager   If 7PM-7AM, please contact night-coverage [email or web address] Password [email or web address]

## 2016-05-03 NOTE — Discharge Instructions (Signed)
Christina Orozco was admitted to the Hospital on 04/30/2016 and Discharged on Discharge Date 05/03/2016 and should be excused from work/school   for 4  days starting 04/30/2016 , may return to work/school without any restrictions.  Call Bess Harvest MD, [place] [facility] [phone] with questions.  Charlynne Cousins M.D on 05/03/2016,at 1:01 PM  [facility] Office  [phone]

## 2016-05-03 NOTE — Progress Notes (Signed)
Physical Therapy Treatment Patient Details Name: Christina Orozco MRN: [id number] DOB: Nov 05, 1970 Today's Date: 05/03/2016    History of Present Illness Christina Orozco is a 45 y.o. female with a history of DM2, GERD, HTN, neuropathy. Patient presented to the emergency Department for evaluation of gait instability has been worsening over the past week with slurred speech and feeling intoxicated. 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     PT Comments    The pt has met all of her goals.  She demonstrated excellent tolerance to gait training and exercises today.  Pt was able to ambulate much further and did not report feeling unsteady or dizzy.  She also showed improvements with her balance activities displaying no loss of balance.  Will notify supervising PT that pt is safe to discharge.    Follow Up Recommendations  Home health PT;Supervision/Assistance - 24 hour     Equipment Recommendations  Rolling walker with 5" wheels    Recommendations for Other Services       Precautions / Restrictions Precautions Precautions: Fall Precaution Comments: Pt reports she fell one time at work pta Restrictions Weight Bearing Restrictions: No    Mobility  Bed Mobility Overal bed mobility: Independent                Transfers Overall transfer level: Modified independent Equipment used: None Transfers: Sit to/from Stand Sit to Stand: Modified independent (Device/Increase time)         General transfer comment: verbal cues for safety  Ambulation/Gait Ambulation/Gait assistance: Modified independent (Device/Increase time) Ambulation Distance (Feet): 300 Feet Assistive device: Rolling walker (2 wheeled) Gait Pattern/deviations: Step-through pattern Gait velocity: Decreased for safety Gait velocity interpretation: at or above normal speed for age/gender General Gait  Details: Pt did not feel unsteady or LOB today.   Stairs            Wheelchair Mobility    Modified Rankin (Stroke Patients Only)       Balance     Sitting balance-Leahy Scale: Good       Standing balance-Leahy Scale: Fair   Single Leg Stance - Right Leg: 10 Single Leg Stance - Left Leg: 10 Tandem Stance - Right Leg: 30 Tandem Stance - Left Leg: 30   [place] - Eyes Closed: 10   High Level Balance Comments: Pt needed occasional UE support during balance activities but self-corrected if starting to sway.    Cognition Arousal/Alertness: Awake/alert Behavior During Therapy: WFL for tasks assessed/performed Overall Cognitive Status: Within Functional Limits for tasks assessed                      Exercises General Exercises - Lower Extremity Heel Raises: AROM;10 reps;Both;Standing    General Comments        Pertinent Vitals/Pain Pain Assessment: No/denies pain    Home Living                      Prior Function            PT Goals (current goals can now be found in the care plan section) Acute Rehab PT Goals Patient Stated Goal: to go home and back to work PT Goal Formulation: With patient Time For Goal Achievement: 05/15/16 Potential to Achieve Goals: Good Progress towards PT goals: Goals met/education completed, patient discharged from PT    Frequency    Min 5X/week  PT Plan      Co-evaluation             End of Session Equipment Utilized During Treatment: Gait belt Activity Tolerance: Patient tolerated treatment well Patient left: in chair;with call bell/phone within reach     Time: 1007-1023 PT Time Calculation (min) (ACUTE ONLY): 16 min  Charges:  $Gait Training: 8-22 mins                    G Codes:      [name] 05-21-16, 12:24 PM Rito Ehrlich. [place], [place]

## 2016-05-03 NOTE — Care Management Note (Signed)
Case Management Note  Patient Details  Name: Kazuko Clemence MRN: [id number] Date of Birth: Apr 30, 1971  Subjective/Objective:                    Action/Plan: Plan is to d/c home today with home health services.  Expected Discharge Date:      05/03/2016           Expected Discharge Plan:  [place]  In-House Referral:     Discharge planning Services  CM Consult  Post Acute Care Choice:    Choice offered to:  Patient  DME Arranged:  Walker rolling, Tub bench DME Agency:  [employer] [name] with [facility] delivered rolling walker and tub bench @ pt's bedside 05/03/2016.  HH Arranged:  PT, OT, Nurse's Aide Blanket Agency:  [employer]/ CM informed [place] @ [phone] pt will d/c today, 05/03/2016.  Status of Service:  Completed, signed off  If discussed at [place] of Stay Meetings, dates discussed:    Additional Comments:  Sharin Mons, RN 05/03/2016, 10:48 AM

## 2016-05-09 ENCOUNTER — Encounter: Payer: Self-pay | Admitting: Neurology

## 2016-05-09 ENCOUNTER — Ambulatory Visit (INDEPENDENT_AMBULATORY_CARE_PROVIDER_SITE_OTHER): Payer: Medicaid Other | Admitting: Neurology

## 2016-05-09 VITALS — BP 152/93 | HR 82 | Ht 63.5 in | Wt 173.4 lb

## 2016-05-09 DIAGNOSIS — G372 Central pontine myelinolysis: Secondary | ICD-10-CM

## 2016-05-09 NOTE — Patient Instructions (Signed)
Remember to drink plenty of fluid, eat healthy meals and do not skip any meals. Try to eat protein with a every meal and eat a healthy snack such as fruit or nuts in between meals. Try to keep a regular sleep-wake schedule and try to exercise daily, particularly in the form of walking, 20-30 minutes a day, if you can.   As far as your medications are concerned, I would like to suggest: Baby Aspirin daily  As far as diagnostic testing: Repeat MRI in 4 months  I would like to see you back in after MRi in 5-6 months, sooner if we need to. Please call us with any interim questions, concerns, problems, updates or refill requests.   Our phone number is [phone]. We also have an after hours call service for urgent matters and there is a physician on-call for urgent questions. For any emergencies you know to call 911 or go to the nearest emergency room

## 2016-05-09 NOTE — Progress Notes (Signed)
[facility]    Provider:  Dr Jaynee Eagles Referring Provider: Dorena Dew, FNP Primary Care Physician:  Dorena Dew, FNP  CC:  Gait abnormality  HPI:  Christina Orozco is a 45 y.o. female here as a referral from Dr. Smith Robert for gait abnormality. PMHx uncontrolled HTN, uncontrolled diabetes. 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  MRA was performed which showed no evidence for high grade stenosis being involved in her condition. She ws drinking a lot of sodas, she was extremely thirsty, the next day she went to church and she was slurring and couldn't walk. She is feeling better. She is having problems with walking and with vision. Mostly up close vision. Right arm pain resolved with "shot in the arm" in the shoulder, she went to orthopaedics and was treated for right shoulder. No dizziness. Some confusion but overall doing really well. Still some imbalance.   MRI brain 04/30/2016:  Interval history 01/26/2016: Patient here for follow up. She had right arm pain with 2 negative EMG/NCS no etiology found except bilateral CTS. MRI of the brain showed abnormal white matter disease likely due to her uncontrolled vascular risk factors but was periventricular and concerning for demyelination, LP for other causes such as MS negative but need repeat MRI to follow especially given continued symptoms. She has uncontrolled HTN today in the office 976 systolic, uncontrolled diabetes HgBA1c 12-14 and eating [employer] in the clinic exam room. She has pain in the elbow, pain in the right shoulder, she still has weakness sin the right arm. She has headaches and vision changes. Vision worsening in the right eye. She got new glasses and she still can't see. She has double vision and blurry vision.   BHA:[id number] [name] a 45 y.o.femalehere as a  referral from Dr. Dayton Martes right hand numbness and paresthesias. PMHx HTN, DM, asthma, neuropathy, cervical degenerative disease s/p acdf.No falls.Numbnees and tingling in the right arm started at 6am yesterday morning and it hasn't stopped. She has numbness in the entire right arm all the way up toe shoulder. She woke up with the pain, the whole arm. At first she thought it was coming from the shoulder, now not sure. She does have shoulder problems, has been hurting for years and she has had surgery on it. She is pulling and yanking on it at work, heat on the shoulder didn't help. She is having weakness in the hand. She is not using the hand to drive. She has significant pain 10/10. Numb and tinlging like when her feet fall asleep. She gets sharp radiating pain in the wrist and shoot upwards. Weakness in the grip. 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 Never had any numbness or tingling in the hand in the past. It is the whole hand even the palm No numbness or weakness of legs.No headache, slurred speech, She has no history of TIA or stroke. No head injuries or falls. No anti-coagulant use.  Reviewed notes, labs and imaging from outside physicians, which showed:  Personally reviewed imaging and agree with the following:  MRI HEAD IMPRESSION:  1. No acute intracranial process. 2. Patchy T2/FLAIR hyperintense foci within the periventricular and deep white matter both cerebral 6 hemispheres, mildly advanced for patient age. These foci are somewhat nonspecific, with differential considerations including accelerated/hereditary small vessel ischemia (  favored), sequela of prior trauma, hypercoagulable state, vasculitis, migraines, prior infection, or possibly underlying demyelinating disease.  MRI CERVICAL IMPRESSION:  1. No acute abnormality within the cervical spine. 2. Status post ACDF at O1-6 without  complication. 3. Minimal multilevel degenerative disc disease as above. There is resultant mild bilateral foraminal narrowing at C3-4. No other significant stenosis within the cervical spine. 4. 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  She presented to the ED on 07/03/2014 and woke up with the symptoms. The paresthesias extended into the forearm.Denied weakness. No falls or inciting factors. She was discharged after negative MRI of the brain and cervical cord. Referred to neurology OP. Glucose was in the 400s. She denies falling asleep in a position that would hurt the arm or in a way that would compress the axilla or any part of the arm. Startes she did not sleep on it at all.   Review of Systems: Patient complains of symptoms per HPI as well as the following symptoms: memory loss, confusion, numbness, headache, slurred speech, dizziness. Pertinent negatives per HPI. All others negative.    Social History   Social History  . Marital status: Single    Spouse name: N/A  . Number of children: 3  . Years of education: 12+   Occupational History  . [employer]     Social History Main Topics  . Smoking status: Never Smoker  . Smokeless tobacco: Never Used  . Alcohol use No  . Drug use: No  . Sexual activity: Not on file   Other Topics Concern  . Not on file   Social History Narrative   Lives with daughter   Caffeine use: daily    Family History  Problem Relation Age of Onset  . Heart disease    . Cancer      colon, stomach, lung  . Diabetes    . Seizures      Past Medical History:  Diagnosis Date  . Asthma   . DDD (degenerative disc disease), cervical   . Diabetes mellitus   . Diabetes mellitus ([place])   . GERD (gastroesophageal reflux disease)   . Hypertension   . Hypertension   . Neuropathy [facility])     Past Surgical History:  Procedure Laterality Date  . ADENOIDECTOMY    . APPENDECTOMY    . CERVICAL SPINE SURGERY  06/2012   C5-C6  . SHOULDER SURGERY    .  TONSILLECTOMY      Current Outpatient Prescriptions  Medication Sig Dispense Refill  . albuterol (PROVENTIL HFA;VENTOLIN HFA) 108 (90 Base) MCG/ACT inhaler Inhale 1-2 puffs into the lungs every 6 (six) hours as needed for wheezing or shortness of breath. 1 Inhaler 1  . amLODipine (NORVASC) 10 MG tablet Take 1 tablet (10 mg total) by mouth daily. 30 tablet 5  . atorvastatin (LIPITOR) 40 MG tablet Take 1 tablet (40 mg total) by mouth daily at 6 PM. 30 tablet 3  . hydrochlorothiazide (HYDRODIURIL) 25 MG tablet Take 1 tablet (25 mg total) by mouth daily. 90 tablet 1  . insulin aspart (NOVOLOG) 100 UNIT/ML FlexPen Inject 10 Units into the skin 3 (three) times daily with meals. 15 mL 11  . insulin detemir (LEVEMIR) 100 UNIT/ML injection Inject 0.15 mLs (15 Units total) into the skin 2 (two) times daily. (Patient taking differently: Inject 20 Units into the skin 2 (two) times daily. ) 10 mL 3  . meclizine (ANTIVERT) 25 MG tablet Take 25 mg by mouth 3 (three) times daily as needed  for dizziness.    . Olopatadine HCl (PATADAY) 0.2 % SOLN Apply 1 drop to eye daily. (Patient taking differently: Apply 1 drop to eye daily as needed (allergies). ) 1 Bottle 0  . omeprazole (PRILOSEC) 40 MG capsule Take 1 capsule (40 mg total) by mouth daily. Reported on 06/08/2015 30 capsule 5  . sertraline (ZOLOFT) 50 MG tablet Take 50 mg by mouth at bedtime. Takes PRN  3  . tiZANidine (ZANAFLEX) 4 MG tablet Take 4 mg by mouth 2 (two) times daily as needed for muscle spasms.      No current facility-administered medications for this visit.     Allergies as of 05/09/2016 - Review Complete 04/30/2016  Allergen Reaction Noted  . Aspirin Other (See Comments) 12/07/2012  . Lisinopril Swelling and Other (See Comments) 03/23/2016  . Penicillins Hives 03/07/2012    Vitals: BP (!) 152/93 (BP Location: Right Arm, Patient Position: Sitting, Cuff Size: Normal)   Pulse 82   Ht 5' 3.5" (1.613 m)   Wt 173 lb 6.4 oz (78.7 kg)    LMP 04/12/2016   BMI 30.23 kg/m  Last Weight:  Wt Readings from Last 1 Encounters:  05/09/16 173 lb 6.4 oz (78.7 kg)   Last Height:   Ht Readings from Last 1 Encounters:  05/09/16 5' 3.5" (1.613 m)     Speech: Speech is normal; fluent and spontaneous with normal comprehension.  Cognition: The patient is oriented to person, place, and time;  Cranial Nerves: The pupils are equal, round, and reactive to light. Visual fields are full to finger confrontation. Extraocular movements are intact with impaired smooth pursuit and end-gaze nystagmus. Decreased left face to LT. ,The muscles of mastication are normal. The face is symmetric. The palate elevates in the midline. Hearing intact. Voice is normal. Shoulder shrug is normal. The tongue has normal motion without fasciculations.   Coordination: Normal finger to nose and heel to shin.  Gait: Difficulty with heel-to-toe but can do it, good stride slight imbalance without walker  Motor Observation: No asymmetry, no atrophy, and no involuntary movements noted. Tone: Normal muscle tone.   Posture: Posture is normal. normal erect  Strength: 5/5 strength.    Sensation: intact pin prick, temperature and vibration distally in the feet. Decreased left arm and face, intact legs. +Romberg.   Reflex Exam:  DTR's: Deep tendon reflexes in the upper and lower extremities are normal bilaterally, reflexes in the AJs 1+.  Toes: Right down, left equiv Clonus: Clonus is absent.   Assessment/Plan:44 y.o. female here as a referral from Dr. Smith Robert originally seen for for right hand numbness and paresthesias which have resolved completely. PMHx HTN, DM, asthma, neuropathy, cervical degenerative disease s/p acdf, uncontrolled diabetes and HTN.   Patient was hospitalized at [facility] this month after uncontrolled glucose causing central pontine myelinosis (597 in the ED) which can cause an osmotic  myelinosis.  She is actually doing extremely well, only some slight imbalance and reported mild vision problems with near vision.   Will repeat MRI of the brain in 4 months.   Sarina Ill, MD  [facility] [address] [place], [place] [address]  Phone [phone] Fax [phone]  A total of 30 minutes was spent face-to-face with this patient. Over half this time was spent on counseling patient on the abnormal white matter changes in brain and central pontine myelinosis and different diagnostic and therapeutic options available.

## 2016-05-16 ENCOUNTER — Telehealth: Payer: Self-pay

## 2016-05-16 MED ORDER — ACCU-CHEK SOFTCLIX LANCET DEV MISC: 3 refills | Status: DC

## 2016-05-16 NOTE — Telephone Encounter (Signed)
Lancets have been sent into pharmacy. Thanks!

## 2016-05-17 ENCOUNTER — Ambulatory Visit (HOSPITAL_COMMUNITY)
Admission: EM | Admit: 2016-05-17 | Discharge: 2016-05-17 | Disposition: A | Discharge status: Home / Self Care | Payer: Medicaid Other | Attending: Emergency Medicine | Admitting: Emergency Medicine

## 2016-05-17 ENCOUNTER — Encounter (HOSPITAL_COMMUNITY): Payer: Self-pay | Admitting: Emergency Medicine

## 2016-05-17 DIAGNOSIS — R600 Localized edema: Secondary | ICD-10-CM

## 2016-05-17 DIAGNOSIS — I10 Essential (primary) hypertension: Secondary | ICD-10-CM

## 2016-05-17 DIAGNOSIS — I872 Venous insufficiency (chronic) (peripheral): Secondary | ICD-10-CM | POA: Diagnosis not present

## 2016-05-17 DIAGNOSIS — M7989 Other specified soft tissue disorders: Secondary | ICD-10-CM | POA: Diagnosis not present

## 2016-05-17 NOTE — Discharge Instructions (Signed)
Please wear compression stocking during daytime. Your repeat BP was 140/90 right arm

## 2016-05-17 NOTE — ED Provider Notes (Signed)
CSN: [id number]     Arrival date & time 05/17/16  1821 History   First MD Initiated Contact with Patient 05/17/16 1855     Chief Complaint  Patient presents with  . Leg Swelling   (Consider location/radiation/quality/duration/timing/severity/associated sxs/prior Treatment) Patient c/o lower extremity for a week.  She states the LEE is worse in her left leg and foot.     The history is provided by the patient.  Hypertension  This is a recurrent problem. The current episode started more than 1 week ago. The problem occurs constantly. The problem has not changed since onset.Nothing aggravates the symptoms. Nothing relieves the symptoms.    Past Medical History:  Diagnosis Date  . Asthma   . DDD (degenerative disc disease), cervical   . Diabetes mellitus   . Diabetes mellitus ([place])   . GERD (gastroesophageal reflux disease)   . Hypertension   . Hypertension   . Neuropathy [facility])    Past Surgical History:  Procedure Laterality Date  . ADENOIDECTOMY    . APPENDECTOMY    . CERVICAL SPINE SURGERY  06/2012   C5-C6  . SHOULDER SURGERY    . TONSILLECTOMY     Family History  Problem Relation Age of Onset  . Heart disease    . Cancer      colon, stomach, lung  . Diabetes    . Seizures     Social History  Substance Use Topics  . Smoking status: Never Smoker  . Smokeless tobacco: Never Used  . Alcohol use No   OB History    No data available     Review of Systems  Constitutional: Negative.   HENT: Negative.   Eyes: Negative.   Cardiovascular: Positive for leg swelling.  Gastrointestinal: Negative.   Endocrine: Negative.   Genitourinary: Negative.   Musculoskeletal: Negative.   Allergic/Immunologic: Negative.   Neurological: Negative.   Hematological: Negative.   Psychiatric/Behavioral: Negative.     Allergies  Lisinopril and Penicillins  Home Medications   Prior to Admission medications   Medication Sig Start Date End Date Taking? Authorizing Provider   albuterol (PROVENTIL HFA;VENTOLIN HFA) 108 (90 Base) MCG/ACT inhaler Inhale 1-2 puffs into the lungs every 6 (six) hours as needed for wheezing or shortness of breath. 07/13/15   Dorena Dew, FNP  amLODipine (NORVASC) 10 MG tablet Take 1 tablet (10 mg total) by mouth daily. 06/08/15   Dorena Dew, FNP  atorvastatin (LIPITOR) 40 MG tablet Take 1 tablet (40 mg total) by mouth daily at 6 PM. 05/03/16   Charlynne Cousins, MD  hydrochlorothiazide (HYDRODIURIL) 25 MG tablet Take 1 tablet (25 mg total) by mouth daily. 03/23/16   Micheline Chapman, NP  insulin aspart (NOVOLOG) 100 UNIT/ML FlexPen Inject 10 Units into the skin 3 (three) times daily with meals. 07/08/15   Dorena Dew, FNP  insulin detemir (LEVEMIR) 100 UNIT/ML injection Inject 0.15 mLs (15 Units total) into the skin 2 (two) times daily. Patient taking differently: Inject 20 Units into the skin 2 (two) times daily.  07/06/15   Dorena Dew, FNP  Lancet Devices [facility]) lancets Use as instructed 05/16/16   Micheline Chapman, NP  meclizine (ANTIVERT) 25 MG tablet Take 25 mg by mouth 3 (three) times daily as needed for dizziness.    Historical Provider, MD  Olopatadine HCl (PATADAY) 0.2 % SOLN Apply 1 drop to eye daily. Patient taking differently: Apply 1 drop to eye daily as needed (allergies).  08/25/15  Dorena Dew, FNP  omeprazole (PRILOSEC) 40 MG capsule Take 1 capsule (40 mg total) by mouth daily. Reported on 06/08/2015 06/08/15   Dorena Dew, FNP  sertraline (ZOLOFT) 50 MG tablet Take 50 mg by mouth at bedtime. Takes PRN 03/23/16   Historical Provider, MD  tiZANidine (ZANAFLEX) 4 MG tablet Take 4 mg by mouth 2 (two) times daily as needed for muscle spasms.     Historical Provider, MD   Meds Ordered and Administered this Visit  Medications - No data to display  BP 171/91 (BP Location: Right Arm)   Pulse 87   Temp 97.6 F (36.4 C) (Oral)   Resp 18   LMP 04/12/2016   SpO2 98%  No data  found.   Physical Exam  Constitutional: She appears well-developed and well-nourished.  HENT:  Head: Normocephalic and atraumatic.  Nose: Nose normal.  Mouth/Throat: Oropharynx is clear and moist.  Eyes: EOM are normal. Pupils are equal, round, and reactive to light.  Neck: Normal range of motion.  Cardiovascular: Normal rate, regular rhythm and normal heart sounds.   Left pretibial and pedal edema 1 plus.  Negative Homans Left lower extremity.  Pulmonary/Chest: Effort normal and breath sounds normal.  Nursing note and vitals reviewed.   Urgent Care Course   Clinical Course     Procedures (including critical care time)  Labs Review Labs Reviewed - No data to display  Imaging Review No results found.   Visual Acuity Review  Right Eye Distance:   Left Eye Distance:   Bilateral Distance:    Right Eye Near:   Left Eye Near:    Bilateral Near:         MDM   1. Leg edema   2. Leg swelling   3. Venous insufficiency   4. Essential hypertension    Continue BP meds Repeat bp 140/90 right arm auscultated  Recommend wearing compression stockings during daytime.  Follow up with PCP.     Lysbeth Penner, FNP 05/17/16 2005

## 2016-05-17 NOTE — ED Triage Notes (Signed)
The patient presented to the [facility] with a complaint of bilateral lower leg swelling and pain that has been ongoing for 1 week. The patient reported a CVA in November.

## 2016-06-01 ENCOUNTER — Telehealth: Payer: Self-pay | Admitting: Neurology

## 2016-06-01 NOTE — Telephone Encounter (Signed)
Patient calling stating she woke up this morning with right wrist and arm pain and wanted to make an appointment with Dr. Jaynee Eagles. I spoke with  Dr. Cathren Laine nurse [name] and she spoke to the patient.Marland Kitchen

## 2016-06-01 NOTE — Telephone Encounter (Signed)
I spoke with pt. She is having same sx that Dr Jaynee Eagles saw her for originally. Per AA,MD we have worked her up extensively neurologically. At this point, we could not find anything neurological that could be causing her sx. She should f/u with PCP. She advised she has appt with them next week. Recommended she be placed on cx list. She stated she already requested this. Advised her to call back if she has any further questions/concerns. She verbalized understanding.

## 2016-06-07 ENCOUNTER — Encounter: Payer: Self-pay | Admitting: Family Medicine

## 2016-06-07 ENCOUNTER — Other Ambulatory Visit: Payer: Self-pay | Admitting: Family Medicine

## 2016-06-07 ENCOUNTER — Ambulatory Visit (INDEPENDENT_AMBULATORY_CARE_PROVIDER_SITE_OTHER): Payer: Medicaid Other | Admitting: Family Medicine

## 2016-06-07 VITALS — BP 152/82 | HR 99 | Temp 98.6°F | Resp 14 | Ht 63.0 in | Wt 177.0 lb

## 2016-06-07 DIAGNOSIS — G629 Polyneuropathy, unspecified: Secondary | ICD-10-CM

## 2016-06-07 DIAGNOSIS — E119 Type 2 diabetes mellitus without complications: Secondary | ICD-10-CM

## 2016-06-07 DIAGNOSIS — I10 Essential (primary) hypertension: Secondary | ICD-10-CM

## 2016-06-07 LAB — COMPLETE METABOLIC PANEL WITH GFR
ALK PHOS: 69 U/L (ref 33–115)
ALT: 31 U/L — ABNORMAL HIGH (ref 6–29)
AST: 24 U/L (ref 10–30)
Albumin: 3.9 g/dL (ref 3.6–5.1)
BUN: 23 mg/dL (ref 7–25)
CALCIUM: 8.9 mg/dL (ref 8.6–10.2)
CHLORIDE: 107 mmol/L (ref 98–110)
CO2: 24 mmol/L (ref 20–31)
CREATININE: 1.16 mg/dL — AB (ref 0.50–1.10)
GFR, EST AFRICAN AMERICAN: 67 mL/min (ref >=60)
GFR, EST NON AFRICAN AMERICAN: 58 mL/min — AB (ref >=60)
Glucose, Bld: 48 mg/dL — ABNORMAL LOW (ref 65–99)
Potassium: 3.6 mmol/L (ref 3.5–5.3)
Sodium: 141 mmol/L (ref 135–146)
TOTAL PROTEIN: 7.1 g/dL (ref 6.1–8.1)
Total Bilirubin: 0.3 mg/dL (ref 0.2–1.2)

## 2016-06-07 LAB — HEMOGLOBIN A1C
Hgb A1c MFr Bld: 9.8 % — ABNORMAL HIGH (ref <5.7)
MEAN PLASMA GLUCOSE: 235 mg/dL

## 2016-06-07 LAB — POCT URINALYSIS DIP (DEVICE)
BILIRUBIN URINE: NEGATIVE
Glucose, UA: NEGATIVE mg/dL
KETONES UR: NEGATIVE mg/dL
NITRITE: NEGATIVE
PH: 6 (ref 5.0–8.0)
PROTEIN: 100 mg/dL — AB
Specific Gravity, Urine: 1.015 (ref 1.005–1.030)
Urobilinogen, UA: 0.2 mg/dL (ref 0.0–1.0)

## 2016-06-07 LAB — GLUCOSE, CAPILLARY: Glucose-Capillary: 54 mg/dL — ABNORMAL LOW (ref 65–99)

## 2016-06-07 LAB — GLUCOSE, POCT (MANUAL RESULT ENTRY): POC GLUCOSE: 54 mg/dL — AB (ref 70–99)

## 2016-06-07 MED ORDER — TRIAMTERENE-HCTZ 37.5-25 MG PO TABS: 1.0000 | ORAL_TABLET | Freq: Every day | ORAL | 1 refills | Status: DC

## 2016-06-07 MED ORDER — PREGABALIN 50 MG PO CAPS: 50.0000 mg | ORAL_CAPSULE | Freq: Every day | ORAL | 2 refills | Status: DC

## 2016-06-07 NOTE — Patient Instructions (Addendum)
1. Stopped Amlodipine due to swelling 2. Will add Maxzide 25, return in 1 week for a blood pressure check 3. Recommend a lowfat, low carbohydrate diet divided over 5-6 small meals, increase water intake to 6-8 glasses,  4. Will notify by phone with laboratory results 5. Will start a trial of Lyrica. Take at night. Do not drink alcohol, drive, or operate machinery while taking this medication.    Diabetes and Foot Care Diabetes may cause you to have problems because of poor blood supply (circulation) to your feet and legs. This may cause the skin on your feet to become thinner, break easier, and heal more slowly. Your skin may become dry, and the skin may peel and crack. You may also have nerve damage in your legs and feet causing decreased feeling in them. You may not notice minor injuries to your feet that could lead to infections or more serious problems. Taking care of your feet is one of the most important things you can do for yourself. Follow these instructions at home:  Wear shoes at all times, even in the house. Do not go barefoot. Bare feet are easily injured.  Check your feet daily for blisters, cuts, and redness. If you cannot see the bottom of your feet, use a mirror or ask someone for help.  Wash your feet with warm water (do not use hot water) and mild soap. Then pat your feet and the areas between your toes until they are completely dry. Do not soak your feet as this can dry your skin.  Apply a moisturizing lotion or petroleum jelly (that does not contain alcohol and is unscented) to the skin on your feet and to dry, brittle toenails. Do not apply lotion between your toes.  Trim your toenails straight across. Do not dig under them or around the cuticle. File the edges of your nails with an emery board or nail file.  Do not cut corns or calluses or try to remove them with medicine.  Wear clean socks or stockings every day. Make sure they are not too tight. Do not wear knee-high  stockings since they may decrease blood flow to your legs.  Wear shoes that fit properly and have enough cushioning. To break in new shoes, wear them for just a few hours a day. This prevents you from injuring your feet. Always look in your shoes before you put them on to be sure there are no objects inside.  Do not cross your legs. This may decrease the blood flow to your feet.  If you find a minor scrape, cut, or break in the skin on your feet, keep it and the skin around it clean and dry. These areas may be cleansed with mild soap and water. Do not cleanse the area with peroxide, alcohol, or iodine.  When you remove an adhesive bandage, be sure not to damage the skin around it.  If you have a wound, look at it several times a day to make sure it is healing.  Do not use heating pads or hot water bottles. They may burn your skin. If you have lost feeling in your feet or legs, you may not know it is happening until it is too late.  Make sure your health care provider performs a complete foot exam at least annually or more often if you have foot problems. Report any cuts, sores, or bruises to your health care provider immediately. Contact a health care provider if:  You have an injury  that is not healing.  You have cuts or breaks in the skin.  You have an ingrown nail.  You notice redness on your legs or feet.  You feel burning or tingling in your legs or feet.  You have pain or cramps in your legs and feet.  Your legs or feet are numb.  Your feet always feel cold. Get help right away if:  There is increasing redness, swelling, or pain in or around a wound.  There is a red line that goes up your leg.  Pus is coming from a wound.  You develop a fever or as directed by your health care provider.  You notice a bad smell coming from an ulcer or wound. This information is not intended to replace advice given to you by your health care provider. Make sure you discuss any questions  you have with your health care provider. Document Released: 05/26/2000 Document Revised: 11/04/2015 Document Reviewed: 11/05/2012 Elsevier Interactive Patient Education  2017 [employer]  Diabetic Nephropathy Diabetic nephropathy is kidney disease that is caused by diabetes. Kidneys are the organs that filter and clean your blood. Kidneys also get rid of body waste products and extra fluid. Diabetes can cause gradual kidney damage over many years. Diabetic nephropathy that continues to get worse can lead to kidney failure. What are the causes? This condition is caused by kidney damage from diabetes. What increases the risk? This condition is more likely to develop in people with diabetes who also have:  High blood pressure.  High blood glucose.  A family history of diabetic nephropathy.  A history of diabetes for many years.  A history of tobacco use.  Certain inherited genes. What are the signs or symptoms? You may already have this condition before symptoms develop. Symptoms may include:  Swelling of your hands, feet, or ankles.  Weakness.  Poor appetite.  Nausea.  Confusion.  Fatigue.  Trouble sleeping. If nephropathy leads to kidney failure, symptoms may include:  Vomiting.  Shortness of breath.  Seizures.  Coma. How is this diagnosed? Usually, your health care provider can diagnose diabetic nephropathy from your symptoms and medical history. Your health care provider will also do a physical exam. It is important to diagnose this condition before symptoms develop so you may also have screening tests to look for any early signs of problems. These tests may include:  Yearly (annual) urine tests.  Urine collection over a 24-hour period to measure kidney function.  Blood tests that measure blood glucose levels and kidney function. Problems other than diabetes can damage kidneys. If screening tests show early kidney damage, but your health care provider  suspects that it is caused by a different problem, you may have other tests, such as:  An ultrasound of your kidneys.  A procedure to take a sample of kidney tissue for testing (biopsy). How is this treated? The goal of treatment is to prevent or slow down damage to your kidneys by managing your diabetes. To do this, it is important to control:  Your blood pressure. Your target blood pressure will be based on your age, the medicines you take, how long you have had diabetes, and any other medical conditions you have. Generally, the goal is to keep your blood pressure below 140/90. Medicines called ACE inhibitors may be used along with a water pill (diuretic) to help you control your blood pressure.  Your A1c (hemoglobin A1c, or HbA1c) level. This is a measurement of your average blood glucose level during  the previous 2-3 months. You and your health care provider should work to keep this number under 7.  Your blood lipids. If you have high cholesterol, you may need to take lipid-lowering drugs, such as statins. Other treatments may include:  Medicines, including insulin injections.  Lifestyle changes, such as:  Losing weight.  Quitting smoking (smoking cessation).  A diet, which may include:  Restrictions of salt (sodium), protein, and fluid intake.  Vitamin D supplements. If your disease progresses to end-stage kidney failure, treatment may include:  Dialysis. This is a procedure to filter your blood with a machine.  Kidney transplant. Follow these instructions at home:  Take over-the-counter and prescription medicines only as told by your health care provider.  Follow instructions from your health care provider about eating or drinking restrictions.  Do not use tobacco products, including cigarettes, chewing tobacco, and e-cigarettes. If you need help quitting, ask your health care provider.  Be physically active every day. Ask your health care provider what type of exercise  is best for you.  Eat healthy foods, and eat healthy snacks between meals. Have 3 meals and 3 snacks each day.  Maintain a healthy weight.  Keep all follow-up visits as told by your health care provider. This is important. Contact a health care provider if:  You have trouble keeping your blood glucose in your goal range.  Your hands, feet, or ankles are swollen.  You feel weak, tired, or dizzy.  You have muscle spasms.  You have nausea or vomiting.  You feel tired all the time. Get help right away if:  You are very sleepy.  You have:  A seizure or convulsion.  Severe, painful muscle spasms.  Shortness of breath.  Chest pain.  You pass out. This information is not intended to replace advice given to you by your health care provider. Make sure you discuss any questions you have with your health care provider. Document Released: 06/18/2007 Document Revised: 11/04/2015 Document Reviewed: 01/18/2015 Elsevier Interactive Patient Education  2017 [employer].

## 2016-06-07 NOTE — Progress Notes (Signed)
Subjective:    Patient ID: Christina Orozco, female    DOB: 1971-04-19, 45 y.o.   MRN: [id number]  Neurologic Problem  The patient's primary symptoms include a loss of balance, memory loss and weakness. The patient's pertinent negatives include no visual change. Primary symptoms comment: Christina Orozco has a history of CVA 1 month ago. She is followed by neurology. . This is a recurrent problem. The current episode started 1 to 4 weeks ago. The neurological problem developed gradually. The problem is unchanged. Associated symptoms include fatigue. Pertinent negatives include no confusion, headaches, neck pain or palpitations. (Neuropathy: numbness and tingling to hands and feet. Worse in hands.) Past treatments include nothing. There is no history of mood changes.  Diabetes  She presents for her follow-up diabetic visit. She has type 2 diabetes mellitus. Her disease course has been fluctuating. Pertinent negatives for hypoglycemia include no confusion, headaches, mood changes, nervousness/anxiousness, pallor, seizures, sleepiness, speech difficulty, sweats or tremors. Associated symptoms include fatigue, foot paresthesias, polyphagia and weakness. Pertinent negatives for diabetes include no foot ulcerations, no polydipsia, no visual change and no weight loss. Symptoms are stable. Diabetic complications include autonomic neuropathy and a CVA. Risk factors for coronary artery disease include hypertension, sedentary lifestyle and dyslipidemia. Current diabetic treatment includes insulin injections and oral agent (dual therapy). She is compliant with treatment all of the time. She is following a diabetic diet. When asked about meal planning, she reported none. She has had a previous visit with a dietitian. Her breakfast blood glucose is taken between 7-8 am. Her breakfast blood glucose range is generally 70-90 mg/dl. (Over the past week) An ACE inhibitor/angiotensin II receptor blocker is contraindicated. She does  not see a podiatrist.Eye exam is current.  Hypertension  This is a chronic problem. The current episode started more than 1 year ago. The problem has been gradually worsening since onset. Associated symptoms include peripheral edema. Pertinent negatives include no headaches, neck pain, palpitations or sweats. Past treatments include calcium channel blockers and diuretics. Hypertensive end-organ damage includes CVA.     Past Medical History:  Diagnosis Date  . Asthma   . DDD (degenerative disc disease), cervical   . Diabetes mellitus   . Diabetes mellitus ([place])   . GERD (gastroesophageal reflux disease)   . Hypertension   . Hypertension   . Neuropathy [facility])    Social History   Social History  . Marital status: Single    Spouse name: N/A  . Number of children: 3  . Years of education: 12+   Occupational History  . [employer]     Social History Main Topics  . Smoking status: Never Smoker  . Smokeless tobacco: Never Used  . Alcohol use No  . Drug use: No  . Sexual activity: Not on file   Other Topics Concern  . Not on file   Social History Narrative   Lives with daughter   Caffeine use: daily   Immunization History  Administered Date(s) Administered  . Influenza,inj,Quad PF,36+ Mos 05/05/2015, 03/23/2016  . Pneumococcal Polysaccharide-23 05/05/2015   Past Surgical History:  Procedure Laterality Date  . ADENOIDECTOMY    . APPENDECTOMY    . CERVICAL SPINE SURGERY  06/2012   C5-C6  . SHOULDER SURGERY    . TONSILLECTOMY     Review of Systems  Constitutional: Positive for fatigue. Negative for weight loss.  HENT: Negative.   Eyes: Negative.   Respiratory: Negative.   Cardiovascular: Negative.  Negative for palpitations and leg  swelling.  Gastrointestinal: Negative.   Endocrine: Positive for polyphagia. Negative for polydipsia.  Genitourinary: Negative.  Negative for flank pain, frequency and urgency.  Musculoskeletal: Negative.  Negative for neck pain.        Left foot pain/neuropathy  Skin: Negative.  Negative for pallor.       Left buttocks abscess  Allergic/Immunologic: Negative.  Negative for immunocompromised state.  Neurological: Positive for weakness and loss of balance. Negative for tremors, seizures, speech difficulty and headaches.       Neuropathy  Hematological: Negative.   Psychiatric/Behavioral: Positive for memory loss. Negative for confusion, sleep disturbance and suicidal ideas. The patient is not nervous/anxious.        Objective:   Physical Exam  Constitutional: She is oriented to person, place, and time. She appears well-developed and well-nourished.  HENT:  Head: Normocephalic and atraumatic.  Right Ear: External ear normal.  Left Ear: External ear normal.  Nose: Nose normal.  Mouth/Throat: Oropharynx is clear and moist.  Eyes: Conjunctivae and EOM are normal. Pupils are equal, round, and reactive to light.  Neck: Normal range of motion. Neck supple.  Cardiovascular: Normal rate, regular rhythm, normal heart sounds and intact distal pulses.   Bilateral lower extremity edema (1+)   Pulmonary/Chest: Effort normal and breath sounds normal.  Abdominal: Soft. Bowel sounds are normal.  Musculoskeletal: Normal range of motion.  Right upper extremity (2/5) strength Left upper extremity (3/5) strength   Right lower extremity weakness  Uses cane periodically to assist with ambulation.   Neurological: She is alert and oriented to person, place, and time. She has normal reflexes. She displays no atrophy. No cranial nerve deficit or sensory deficit. She exhibits normal muscle tone. Gait normal.  Skin: Skin is warm.  Psychiatric: She has a normal mood and affect. Her behavior is normal. Judgment and thought content normal.      BP (!) 152/82 (BP Location: Right Arm, Patient Position: Sitting, Cuff Size: Normal)   Pulse 99   Temp 98.6 F (37 C) (Oral)   Resp 14   Ht 5\' 3"  (1.6 m)   Wt 177 lb (80.3 kg)   LMP  06/01/2016   SpO2 100%   BMI 31.35 kg/m  Assessment & Plan:   1. Neuropathy ([place]) Will start a trial of Lyrica for progressing neuropathy. Patient was treated with gabapentin in the past without sustained relief. Will follow up in 1 month.  - pregabalin (LYRICA) 50 MG capsule; Take 1 capsule (50 mg total) by mouth daily.  Dispense: 30 capsule; Refill: 2  2. Essential hypertension Blood pressure is above goal on current medication regimen. Also, patient has lower extremity edema. Will stop Amlodipine 10 mg. Will add Maxzide to medication regimen. Will return in 1 week for blood pressure check. Mild proteinuria present.  - Aspirin (ASPIR-81 PO); Take by mouth. - triamterene-hydrochlorothiazide (MAXZIDE-25) 37.5-25 MG tablet; Take 1 tablet by mouth daily.  Dispense: 90 tablet; Refill: 1  3. Type 2 diabetes mellitus without complication, without long-term current use of insulin (HCC) CBG 54. Patient was given a 15 gram carbohydrate snack while in office. Advised to eat 5-6 small carbohydrate modified, high protein meals throughout the day.  - Hemoglobin A1c - Glucose (CBG) - COMPLETE METABOLIC PANEL WITH GFR - Glucose, capillary - POCT urinalysis dip (device)    JKD:TOIZ follow up in 1 week for bp check. 1 month follow up for DMII and hypertension  Hollis,Lachina M, FNP  The patient was given clear instructions to go  to ER or return to medical center if symptoms do not improve, worsen or new problems develop. The patient verbalized understanding. Will notify patient with laboratory results.

## 2016-06-14 ENCOUNTER — Encounter: Payer: Self-pay | Admitting: Family Medicine

## 2016-06-14 DIAGNOSIS — I10 Essential (primary) hypertension: Secondary | ICD-10-CM

## 2016-06-14 MED ORDER — METOPROLOL SUCCINATE ER 25 MG PO TB24: 25.0000 mg | ORAL_TABLET | Freq: Every day | ORAL | 0 refills | Status: DC

## 2016-06-14 NOTE — Progress Notes (Signed)
Meds ordered this encounter  Medications  . metoprolol succinate (TOPROL-XL) 25 MG 24 hr tablet    Sig: Take 1 tablet (25 mg total) by mouth daily.    Dispense:  30 tablet    Refill:  0    Reola Buckles M, FNP

## 2016-06-20 ENCOUNTER — Emergency Department (HOSPITAL_COMMUNITY): Payer: Medicaid Other

## 2016-06-20 ENCOUNTER — Encounter (HOSPITAL_COMMUNITY): Payer: Self-pay | Admitting: Emergency Medicine

## 2016-06-20 ENCOUNTER — Emergency Department (HOSPITAL_COMMUNITY)
Admission: EM | Admit: 2016-06-20 | Discharge: 2016-06-20 | Disposition: A | Discharge status: Home / Self Care | Payer: Medicaid Other | Attending: Emergency Medicine | Admitting: Emergency Medicine

## 2016-06-20 ENCOUNTER — Other Ambulatory Visit: Payer: Self-pay | Admitting: Family Medicine

## 2016-06-20 DIAGNOSIS — Z794 Long term (current) use of insulin: Secondary | ICD-10-CM | POA: Diagnosis not present

## 2016-06-20 DIAGNOSIS — Z7982 Long term (current) use of aspirin: Secondary | ICD-10-CM | POA: Diagnosis not present

## 2016-06-20 DIAGNOSIS — I129 Hypertensive chronic kidney disease with stage 1 through stage 4 chronic kidney disease, or unspecified chronic kidney disease: Secondary | ICD-10-CM | POA: Diagnosis not present

## 2016-06-20 DIAGNOSIS — N183 Chronic kidney disease, stage 3 (moderate): Secondary | ICD-10-CM | POA: Insufficient documentation

## 2016-06-20 DIAGNOSIS — Z8673 Personal history of transient ischemic attack (TIA), and cerebral infarction without residual deficits: Secondary | ICD-10-CM | POA: Insufficient documentation

## 2016-06-20 DIAGNOSIS — Z79899 Other long term (current) drug therapy: Secondary | ICD-10-CM | POA: Insufficient documentation

## 2016-06-20 DIAGNOSIS — J45909 Unspecified asthma, uncomplicated: Secondary | ICD-10-CM | POA: Insufficient documentation

## 2016-06-20 DIAGNOSIS — R42 Dizziness and giddiness: Secondary | ICD-10-CM

## 2016-06-20 DIAGNOSIS — E1140 Type 2 diabetes mellitus with diabetic neuropathy, unspecified: Secondary | ICD-10-CM | POA: Diagnosis not present

## 2016-06-20 DIAGNOSIS — G629 Polyneuropathy, unspecified: Secondary | ICD-10-CM

## 2016-06-20 DIAGNOSIS — E1122 Type 2 diabetes mellitus with diabetic chronic kidney disease: Secondary | ICD-10-CM | POA: Diagnosis not present

## 2016-06-20 HISTORY — DX: Cerebral infarction, unspecified: I63.9

## 2016-06-20 LAB — DIFFERENTIAL
BASOS ABS: 0 10*3/uL (ref 0.0–0.1)
Basophils Relative: 0 %
Eosinophils Absolute: 0.1 10*3/uL (ref 0.0–0.7)
Eosinophils Relative: 2 %
LYMPHS ABS: 2.3 10*3/uL (ref 0.7–4.0)
LYMPHS PCT: 42 %
Monocytes Absolute: 0.3 10*3/uL (ref 0.1–1.0)
Monocytes Relative: 5 %
NEUTROS PCT: 51 %
Neutro Abs: 2.8 10*3/uL (ref 1.7–7.7)

## 2016-06-20 LAB — COMPREHENSIVE METABOLIC PANEL
ALBUMIN: 3.8 g/dL (ref 3.5–5.0)
ALK PHOS: 54 U/L (ref 38–126)
ALT: 14 U/L (ref 14–54)
AST: 16 U/L (ref 15–41)
Anion gap: 7 (ref 5–15)
BILIRUBIN TOTAL: 0.5 mg/dL (ref 0.3–1.2)
BUN: 29 mg/dL — AB (ref 6–20)
CALCIUM: 9.4 mg/dL (ref 8.9–10.3)
CO2: 22 mmol/L (ref 22–32)
Chloride: 107 mmol/L (ref 101–111)
Creatinine, Ser: 1.3 mg/dL — ABNORMAL HIGH (ref 0.44–1.00)
GFR calc Af Amer: 57 mL/min — ABNORMAL LOW (ref >60)
GFR calc non Af Amer: 49 mL/min — ABNORMAL LOW (ref >60)
GLUCOSE: 180 mg/dL — AB (ref 65–99)
POTASSIUM: 4 mmol/L (ref 3.5–5.1)
Sodium: 136 mmol/L (ref 135–145)
TOTAL PROTEIN: 7.3 g/dL (ref 6.5–8.1)

## 2016-06-20 LAB — PROTIME-INR
INR: 1.03
Prothrombin Time: 13.5 seconds (ref 11.4–15.2)

## 2016-06-20 LAB — CBC
HCT: 32.1 % — ABNORMAL LOW (ref 36.0–46.0)
HEMOGLOBIN: 10.7 g/dL — AB (ref 12.0–15.0)
MCH: 27.8 pg (ref 26.0–34.0)
MCHC: 33.3 g/dL (ref 30.0–36.0)
MCV: 83.4 fL (ref 78.0–100.0)
Platelets: 216 10*3/uL (ref 150–400)
RBC: 3.85 MIL/uL — ABNORMAL LOW (ref 3.87–5.11)
RDW: 13.5 % (ref 11.5–15.5)
WBC: 5.4 10*3/uL (ref 4.0–10.5)

## 2016-06-20 LAB — APTT: aPTT: 27 seconds (ref 24–36)

## 2016-06-20 LAB — I-STAT CHEM 8, ED
BUN: 27 mg/dL — AB (ref 6–20)
CALCIUM ION: 1.33 mmol/L (ref 1.15–1.40)
Chloride: 110 mmol/L (ref 101–111)
Creatinine, Ser: 1.3 mg/dL — ABNORMAL HIGH (ref 0.44–1.00)
Glucose, Bld: 174 mg/dL — ABNORMAL HIGH (ref 65–99)
HCT: 32 % — ABNORMAL LOW (ref 36.0–46.0)
Hemoglobin: 10.9 g/dL — ABNORMAL LOW (ref 12.0–15.0)
Potassium: 4.1 mmol/L (ref 3.5–5.1)
SODIUM: 140 mmol/L (ref 135–145)
TCO2: 21 mmol/L (ref 0–100)

## 2016-06-20 LAB — I-STAT TROPONIN, ED: Troponin i, poc: 0 ng/mL (ref 0.00–0.08)

## 2016-06-20 LAB — CBG MONITORING, ED: Glucose-Capillary: 161 mg/dL — ABNORMAL HIGH (ref 65–99)

## 2016-06-20 MED ORDER — METOCLOPRAMIDE HCL 5 MG/ML IJ SOLN
5.0000 mg | Freq: Once | INTRAMUSCULAR | Status: AC
  Administered 2016-06-20: 5 mg via INTRAVENOUS
  Filled 2016-06-20: qty 2

## 2016-06-20 MED ORDER — GABAPENTIN 400 MG PO CAPS: 400.0000 mg | ORAL_CAPSULE | Freq: Three times a day (TID) | ORAL | 1 refills | Status: DC

## 2016-06-20 MED ORDER — DIPHENHYDRAMINE HCL 50 MG/ML IJ SOLN
12.5000 mg | Freq: Once | INTRAMUSCULAR | Status: AC
  Administered 2016-06-20: 12.5 mg via INTRAVENOUS
  Filled 2016-06-20: qty 1

## 2016-06-20 NOTE — ED Notes (Signed)
Patient transported to CT 

## 2016-06-20 NOTE — ED Notes (Signed)
Patient returned from MRI.

## 2016-06-20 NOTE — ED Triage Notes (Addendum)
Pt reports she woke up with dizziness and unsteady gait. Daughter said she had unilateral eyelid drooping this am, but none now. Equal extremity strength, speech clear. Denies lightheadedness. Pt has hx of stroke earlier this fall. Not on blood thinners. Also complains of a HA.

## 2016-06-20 NOTE — ED Notes (Signed)
Patient transported to MRI 

## 2016-06-20 NOTE — ED Provider Notes (Signed)
[facility] DEPT Provider Note   CSN: [id number] Arrival date & time: 06/20/16  0803     History   Chief Complaint Chief Complaint  Patient presents with  . Dizziness  . Headache    HPI Christina Orozco is a 46 y.o. female.  46 year old female presents with dizziness as well as ataxia when she awoke this morning. States that she has a history of neuropathy and did take 300 mg of gabapentin. She has described his medication but has been off it for some time. Does have a prior history of stroke with some left-sided deficits. Has had a mild headache and nausea but no vomiting. Does not use a cane or walker. Possible facial drooping which is resolved. No recent history of volume loss. Symptoms persistent and nothing makes them better. No treatment used for this prior to arrival.      Past Medical History:  Diagnosis Date  . Asthma   . DDD (degenerative disc disease), cervical   . Diabetes mellitus   . Diabetes mellitus ([place])   . GERD (gastroesophageal reflux disease)   . Hypertension   . Hypertension   . Neuropathy ([place])   . Stroke [facility])     Patient Active Problem List   Diagnosis Date Noted  . Neuropathy ([place]) 06/07/2016  . Gait instability   . Lesion of pons   . Slurred speech   . Type 2 diabetes mellitus with hyperosmolar nonketotic hyperglycemia ([place]) 04/30/2016  . Dizziness 04/30/2016  . Hyperglycemia 04/30/2016  . Chronic kidney disease (CKD), stage III (moderate) 04/30/2016  . Ataxia 04/30/2016  . Chronic right shoulder pain 04/03/2016  . 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 Type 2 diabetes mellitus with diabetic neuropathy, with long-term current use of insulin ([place]) 11/16/2015  . Rhinitis, allergic 08/25/2015  . Asthma, mild intermittent 07/14/2015  . Gastroesophageal reflux disease without esophagitis 06/08/2015  . Alopecia 06/08/2015  . Type 2 diabetes mellitus without complication, without long-term current use of insulin ([place]) 05/05/2015    . Essential hypertension 05/05/2015  . Vitamin D deficiency 05/05/2015    Past Surgical History:  Procedure Laterality Date  . ADENOIDECTOMY    . APPENDECTOMY    . CERVICAL SPINE SURGERY  06/2012   C5-C6  . SHOULDER SURGERY    . TONSILLECTOMY      OB History    No data available       Home Medications    Prior to Admission medications   Medication Sig Start Date End Date Taking? Authorizing Provider  albuterol (PROVENTIL HFA;VENTOLIN HFA) 108 (90 Base) MCG/ACT inhaler Inhale 1-2 puffs into the lungs every 6 (six) hours as needed for wheezing or shortness of breath. 07/13/15   Dorena Dew, FNP  Aspirin (ASPIR-81 PO) Take by mouth.    Historical Provider, MD  atorvastatin (LIPITOR) 40 MG tablet Take 1 tablet (40 mg total) by mouth daily at 6 PM. 05/03/16   Charlynne Cousins, MD  gabapentin (NEURONTIN) 400 MG capsule Take 1 capsule (400 mg total) by mouth 3 (three) times daily. 06/20/16   Dorena Dew, FNP  insulin aspart (NOVOLOG) 100 UNIT/ML FlexPen Inject 10 Units into the skin 3 (three) times daily with meals. 07/08/15   Dorena Dew, FNP  insulin detemir (LEVEMIR) 100 UNIT/ML injection Inject 0.15 mLs (15 Units total) into the skin 2 (two) times daily. Patient taking differently: Inject 20 Units into the skin 2 (two) times daily.  07/06/15   Dorena Dew, FNP  Lancet Devices (  ACCU-CHEK SOFTCLIX) lancets Use as instructed 05/16/16   Micheline Chapman, NP  meclizine (ANTIVERT) 25 MG tablet Take 25 mg by mouth 3 (three) times daily as needed for dizziness.    Historical Provider, MD  metoprolol succinate (TOPROL-XL) 25 MG 24 hr tablet Take 1 tablet (25 mg total) by mouth daily. 06/14/16   Dorena Dew, FNP  Olopatadine HCl (PATADAY) 0.2 % SOLN Apply 1 drop to eye daily. 08/25/15   Dorena Dew, FNP  omeprazole (PRILOSEC) 40 MG capsule Take 1 capsule (40 mg total) by mouth daily. Reported on 06/08/2015 06/08/15   Dorena Dew, FNP  sertraline (ZOLOFT) 50 MG  tablet Take 50 mg by mouth at bedtime. Takes PRN 03/23/16   Historical Provider, MD  tiZANidine (ZANAFLEX) 4 MG tablet Take 4 mg by mouth 2 (two) times daily as needed for muscle spasms.     Historical Provider, MD  triamterene-hydrochlorothiazide (MAXZIDE-25) 37.5-25 MG tablet Take 1 tablet by mouth daily. 06/07/16   Dorena Dew, FNP    Family History Family History  Problem Relation Age of Onset  . Heart disease    . Cancer      colon, stomach, lung  . Diabetes    . Seizures      Social History Social History  Substance Use Topics  . Smoking status: Never Smoker  . Smokeless tobacco: Never Used  . Alcohol use No     Allergies   Lisinopril and Penicillins   Review of Systems Review of Systems  All other systems reviewed and are negative.    Physical Exam Updated Vital Signs BP 178/98 (BP Location: Left Arm)   Pulse 78   Temp 98.3 F (36.8 C)   Resp 18   LMP 06/01/2016   SpO2 100%   Physical Exam  Constitutional: She is oriented to person, place, and time. She appears well-developed and well-nourished.  Non-toxic appearance. No distress.  HENT:  Head: Normocephalic and atraumatic.  Eyes: Conjunctivae, EOM and lids are normal. Pupils are equal, round, and reactive to light.  Neck: Normal range of motion. Neck supple. No tracheal deviation present. No thyroid mass present.  Cardiovascular: Normal rate, regular rhythm and normal heart sounds.  Exam reveals no gallop.   No murmur heard. Pulmonary/Chest: Effort normal and breath sounds normal. No stridor. No respiratory distress. She has no decreased breath sounds. She has no wheezes. She has no rhonchi. She has no rales.  Abdominal: Soft. Normal appearance and bowel sounds are normal. She exhibits no distension. There is no tenderness. There is no rebound and no CVA tenderness.  Musculoskeletal: Normal range of motion. She exhibits no edema or tenderness.  Neurological: She is alert and oriented to person,  place, and time. She has normal strength. No cranial nerve deficit or sensory deficit. GCS eye subscore is 4. GCS verbal subscore is 5. GCS motor subscore is 6.  Skin: Skin is warm and dry. No abrasion and no rash noted.  Psychiatric: She has a normal mood and affect. Her speech is normal and behavior is normal.  Nursing note and vitals reviewed.    ED Treatments / Results  Labs (all labs ordered are listed, but only abnormal results are displayed) Labs Reviewed  PROTIME-INR  APTT  CBC  DIFFERENTIAL  COMPREHENSIVE METABOLIC PANEL  Randolm Idol, ED  CBG MONITORING, ED  I-STAT CHEM 8, ED    EKG  EKG Interpretation  Date/Time:  Tuesday June 20 2016 08:18:33 EST Ventricular Rate:  80  PR Interval:    QRS Duration: 74 QT Interval:  386 QTC Calculation: 446 R Axis:   66 Text Interpretation:  Sinus rhythm No significant change since last tracing Confirmed by Kirk Basquez  MD, Lareen Mullings ([id number]) on 06/20/2016 10:23:56 AM       Radiology Ct Head Wo Contrast  Result Date: 06/20/2016 CLINICAL DATA:  Dizziness, unsteady gait EXAM: CT HEAD WITHOUT CONTRAST TECHNIQUE: Contiguous axial images were obtained from the base of the skull through the vertex without intravenous contrast. COMPARISON:  MR brain 05/02/2016 FINDINGS: Brain: No evidence of acute infarction, hemorrhage, hydrocephalus, extra-axial collection or mass lesion/mass effect. Vascular: No hyperdense vessel or unexpected calcification. Skull: No osseous abnormality. Sinuses/Orbits: Visualized paranasal sinuses are clear. Visualized mastoid sinuses are clear. Visualized orbits demonstrate no focal abnormality. Other: None IMPRESSION: Normal CT of the brain without intravenous contrast. Electronically Signed   By: Kathreen Devoid   On: 06/20/2016 10:18    Procedures Procedures (including critical care time)  Medications Ordered in ED Medications - No data to display   Initial Impression / Assessment and Plan / ED Course  I have  reviewed the triage vital signs and the nursing notes.  Pertinent labs & imaging results that were available during my care of the patient were reviewed by me and considered in my medical decision making (see chart for details).  Clinical Course     Patient's MRI without acute findings. Suspect her symptoms could be from her gabapentin. She has no focal neurological deficits. Headache was treated here. Return precautions given  Final Clinical Impressions(s) / ED Diagnoses   Final diagnoses:  None    New Prescriptions New Prescriptions   GABAPENTIN (NEURONTIN) 400 MG CAPSULE    Take 1 capsule (400 mg total) by mouth 3 (three) times daily.     Lacretia Leigh, MD 06/20/16 [phone]

## 2016-06-26 ENCOUNTER — Ambulatory Visit: Payer: Medicaid Other | Admitting: Family Medicine

## 2016-06-26 DIAGNOSIS — Z0271 Encounter for disability determination: Secondary | ICD-10-CM

## 2016-07-11 ENCOUNTER — Encounter: Payer: Self-pay | Admitting: Family Medicine

## 2016-07-11 ENCOUNTER — Other Ambulatory Visit: Payer: Self-pay | Admitting: Family Medicine

## 2016-07-11 ENCOUNTER — Ambulatory Visit (INDEPENDENT_AMBULATORY_CARE_PROVIDER_SITE_OTHER): Payer: Medicaid Other | Admitting: Family Medicine

## 2016-07-11 VITALS — BP 164/82 | Temp 98.6°F | Resp 18 | Ht 63.5 in | Wt 180.4 lb

## 2016-07-11 DIAGNOSIS — I10 Essential (primary) hypertension: Secondary | ICD-10-CM | POA: Diagnosis not present

## 2016-07-11 DIAGNOSIS — R413 Other amnesia: Secondary | ICD-10-CM | POA: Diagnosis not present

## 2016-07-11 DIAGNOSIS — E119 Type 2 diabetes mellitus without complications: Secondary | ICD-10-CM

## 2016-07-11 DIAGNOSIS — D649 Anemia, unspecified: Secondary | ICD-10-CM | POA: Diagnosis not present

## 2016-07-11 DIAGNOSIS — G629 Polyneuropathy, unspecified: Secondary | ICD-10-CM

## 2016-07-11 MED ORDER — METOPROLOL SUCCINATE ER 25 MG PO TB24: 25.0000 mg | ORAL_TABLET | Freq: Every day | ORAL | 5 refills | Status: DC

## 2016-07-11 MED ORDER — INSULIN PEN NEEDLE 29G X 12.7MM MISC: 1.0000 | Freq: Every day | 11 refills | Status: DC | PRN

## 2016-07-11 MED ORDER — GLUCOSE BLOOD VI STRP: ORAL_STRIP | 12 refills | Status: DC

## 2016-07-11 MED ORDER — FERROUS SULFATE 325 (65 FE) MG PO TABS: 325.0000 mg | ORAL_TABLET | Freq: Every day | ORAL | 3 refills | Status: DC

## 2016-07-11 NOTE — Patient Instructions (Addendum)
Diabetes and Foot Care Diabetes may cause you to have problems because of poor blood supply (circulation) to your feet and legs. This may cause the skin on your feet to become thinner, break easier, and heal more slowly. Your skin may become dry, and the skin may peel and crack. You may also have nerve damage in your legs and feet causing decreased feeling in them. You may not notice minor injuries to your feet that could lead to infections or more serious problems. Taking care of your feet is one of the most important things you can do for yourself. Follow these instructions at home:  Wear shoes at all times, even in the house. Do not go barefoot. Bare feet are easily injured.  Check your feet daily for blisters, cuts, and redness. If you cannot see the bottom of your feet, use a mirror or ask someone for help.  Wash your feet with warm water (do not use hot water) and mild soap. Then pat your feet and the areas between your toes until they are completely dry. Do not soak your feet as this can dry your skin.  Apply a moisturizing lotion or petroleum jelly (that does not contain alcohol and is unscented) to the skin on your feet and to dry, brittle toenails. Do not apply lotion between your toes.  Trim your toenails straight across. Do not dig under them or around the cuticle. File the edges of your nails with an emery board or nail file.  Do not cut corns or calluses or try to remove them with medicine.  Wear clean socks or stockings every day. Make sure they are not too tight. Do not wear knee-high stockings since they may decrease blood flow to your legs.  Wear shoes that fit properly and have enough cushioning. To break in new shoes, wear them for just a few hours a day. This prevents you from injuring your feet. Always look in your shoes before you put them on to be sure there are no objects inside.  Do not cross your legs. This may decrease the blood flow to your feet.  If you find a  minor scrape, cut, or break in the skin on your feet, keep it and the skin around it clean and dry. These areas may be cleansed with mild soap and water. Do not cleanse the area with peroxide, alcohol, or iodine.  When you remove an adhesive bandage, be sure not to damage the skin around it.  If you have a wound, look at it several times a day to make sure it is healing.  Do not use heating pads or hot water bottles. They may burn your skin. If you have lost feeling in your feet or legs, you may not know it is happening until it is too late.  Make sure your health care provider performs a complete foot exam at least annually or more often if you have foot problems. Report any cuts, sores, or bruises to your health care provider immediately. Contact a health care provider if:  You have an injury that is not healing.  You have cuts or breaks in the skin.  You have an ingrown nail.  You notice redness on your legs or feet.  You feel burning or tingling in your legs or feet.  You have pain or cramps in your legs and feet.  Your legs or feet are numb.  Your feet always feel cold. Get help right away if:  There is increasing   redness, swelling, or pain in or around a wound.  There is a red line that goes up your leg.  Pus is coming from a wound.  You develop a fever or as directed by your health care provider.  You notice a bad smell coming from an ulcer or wound. This information is not intended to replace advice given to you by your health care provider. Make sure you discuss any questions you have with your health care provider. Document Released: 05/26/2000 Document Revised: 11/04/2015 Document Reviewed: 11/05/2012 Elsevier Interactive Patient Education  2017 [employer]  Diabetic Neuropathy Diabetic neuropathy is a nerve disease or nerve damage that is caused by diabetes mellitus. About half of all people with diabetes mellitus have some form of nerve damage. Nerve damage  is more common in those who have had diabetes mellitus for many years and who generally have not had good control of their blood sugar (glucose) level. Diabetic neuropathy is a common complication of diabetes mellitus. There are three common types of diabetic neuropathy and a fourth type that is less common and less understood:  Peripheral neuropathy-This is the most common type of diabetic neuropathy. It causes damage to the nerves of the feet and legs first and then eventually the hands and arms. The damage affects the ability to sense touch.  Autonomic neuropathy-This type causes damage to the autonomic nervous system, which controls the following functions: ? Heartbeat. ? Body temperature. ? Blood pressure. ? Urination. ? Digestion. ? Sweating. ? Sexual function.  Focal neuropathy-Focal neuropathy can be painful and unpredictable and occurs most often in older adults with diabetes mellitus. It involves a specific nerve or one area and often comes on suddenly. It usually does not cause long-term problems.  Radiculoplexus neuropathy- Sometimes called lumbosacral radiculoplexus neuropathy, radiculoplexus neuropathy affects the nerves of the thighs, hips, buttocks, or legs. It is more common in people with type 2 diabetes mellitus and in older men. It is characterized by debilitating pain, weakness, and atrophy, usually in the thigh muscles.  What are the causes? The cause of peripheral, autonomic, and focal neuropathies is diabetes mellitus that is uncontrolled and high glucose levels. The cause of radiculoplexus neuropathy is unknown. However, it is thought to be caused by inflammation related to uncontrolled glucose levels. What are the signs or symptoms? Peripheral Neuropathy Peripheral neuropathy develops slowly over time. When the nerves of the feet and legs no longer work there may be:  Burning, stabbing, or aching pain in the legs or feet.  Inability to feel pressure or pain in your  feet. This can lead to: ? Thick calluses over pressure areas. ? Pressure sores. ? Ulcers.  Foot deformities.  Reduced ability to feel temperature changes.  Muscle weakness.  Autonomic Neuropathy The symptoms of autonomic neuropathy vary depending on which nerves are affected. Symptoms may include:  Problems with digestion, such as: ? Feeling sick to your stomach (nausea). ? Vomiting. ? Bloating. ? Constipation. ? Diarrhea. ? Abdominal pain.  Difficulty with urination. This occurs if you lose your ability to sense when your bladder is full. Problems include: ? Urine leakage (incontinence). ? Inability to empty your bladder completely (retention).  Rapid or irregular heartbeat (palpitations).  Blood pressure drops when you stand up (orthostatic hypotension). When you stand up you may feel: ? Dizzy. ? Weak. ? Faint.  In men, inability to attain and maintain an erection.  In women, vaginal dryness and problems with decreased sexual desire and arousal.  Problems with body temperature   regulation.  Increased or decreased sweating.  Focal Neuropathy  Abnormal eye movements or abnormal alignment of both eyes.  Weakness in the wrist.  Foot drop. This results in an inability to lift the foot properly and abnormal walking or foot movement.  Paralysis on one side of your face (Bell palsy).  Chest or abdominal pain. Radiculoplexus Neuropathy  Sudden, severe pain in your hip, thigh, or buttocks.  Weakness and wasting of thigh muscles.  Difficulty rising from a seated position.  Abdominal swelling.  Unexplained weight loss (usually more than 10 lb [4.5 kg]). How is this diagnosed? Peripheral Neuropathy Your senses may be tested. Sensory function testing can be done with:  A light touch using a monofilament.  A vibration with tuning fork.  A sharp sensation with a pin prick.  Other tests that can help diagnose neuropathy are:  Nerve conduction velocity. This  test checks the transmission of an electrical current through a nerve.  Electromyography. This shows how muscles respond to electrical signals transmitted by nearby nerves.  Quantitative sensory testing. This is used to assess how your nerves respond to vibrations and changes in temperature.  Autonomic Neuropathy Diagnosis is often based on reported symptoms. Tell your health care provider if you experience:  Dizziness.  Constipation.  Diarrhea.  Inappropriate urination or inability to urinate.  Inability to get or maintain an erection.  Tests that may be done include:  Electrocardiography or Holter monitor. These are tests that can help show problems with the heart rate or heart rhythm.  An X-ray exam may be done.  Focal Neuropathy Diagnosis is made based on your symptoms and what your health care provider finds during your exam. Other tests may be done. They may include:  Nerve conduction velocities. This checks the transmission of electrical current through a nerve.  Electromyography. This shows how muscles respond to electrical signals transmitted by nearby nerves.  Quantitative sensory testing. This test is used to assess how your nerves respond to vibration and changes in temperature.  Radiculoplexus Neuropathy  Often the first thing is to eliminate any other issue or problems that might be the cause, as there is no standard test for diagnosis.  X-ray exam of your spine and lumbar region.  Spinal tap to rule out cancer.  MRI to rule out other lesions. How is this treated? Once nerve damage occurs, it cannot be reversed. The goal of treatment is to keep the disease or nerve damage from getting worse and affecting more nerve fibers. Controlling your blood glucose level is the key. Most people with radiculoplexus neuropathy see at least a partial improvement over time. You will need to keep your blood glucose and HbA1c levels in the target range determined by your  health care provider. Things that help control blood glucose levels include:  Blood glucose monitoring.  Meal planning.  Physical activity.  Diabetes medicine.  Over time, maintaining lower blood glucose levels helps lessen symptoms. Sometimes, prescription pain medicine is needed. Follow these instructions at home:  Do not smoke.  Keep your blood glucose level in the range that you and your health care provider have determined acceptable for you.  Keep your blood pressure level in the range that you and your health care provider have determined acceptable for you.  Eat a well-balanced diet.  Be physically active every day. Include strength training and balance exercises.  Protect your feet. ? Check your feet every day for sores, cuts, blisters, or signs of infection. ? Wear padded socks   and supportive shoes. Use orthotic inserts, if necessary. ? Regularly check the insides of your shoes for worn spots. Make sure there are no rocks or other items inside your shoes before you put them on. Contact a health care provider if:  You have burning, stabbing, or aching pain in the legs or feet.  You are unable to feel pressure or pain in your feet.  You develop problems with digestion such as: ? Nausea. ? Vomiting. ? Bloating. ? Constipation. ? Diarrhea. ? Abdominal pain.  You have difficulty with urination, such as: ? Incontinence. ? Retention.  You have palpitations.  You develop orthostatic hypotension. When you stand up you may feel: ? Dizzy. ? Weak. ? Faint.  You cannot attain and maintain an erection (in men).  You have vaginal dryness and problems with decreased sexual desire and arousal (in women).  You have severe pain in your thighs, legs, or buttocks.  You have unexplained weight loss. This information is not intended to replace advice given to you by your health care provider. Make sure you discuss any questions you have with your health care  provider. Document Released: 08/07/2001 Document Revised: 11/04/2015 Document Reviewed: 11/07/2012 Elsevier Interactive Patient Education  2017 [employer]  

## 2016-07-11 NOTE — Progress Notes (Signed)
Subjective:    Patient ID: Christina Orozco, female    DOB: [birth year], 46 y.o.   MRN: [id number]  Neurologic Problem  The patient's primary symptoms include a loss of balance, memory loss and weakness. The patient's pertinent negatives include no visual change. Primary symptoms comment: Christina Orozco has a history of CVA 1 month ago. She is followed by neurology. . This is a recurrent problem. The current episode started 1 to 4 weeks ago. The neurological problem developed gradually. The problem is unchanged. Associated symptoms include fatigue. Pertinent negatives include no chest pain, confusion, headaches, neck pain or palpitations. (Neuropathy: numbness and tingling to hands and feet. Worse in hands.) Past treatments include nothing. There is no history of mood changes.  Diabetes  She presents for her follow-up diabetic visit. She has type 2 diabetes mellitus. Her disease course has been fluctuating. Pertinent negatives for hypoglycemia include no confusion, headaches, mood changes, nervousness/anxiousness, pallor, seizures, sleepiness, speech difficulty, sweats or tremors. Associated symptoms include fatigue, foot paresthesias, polyphagia and weakness. Pertinent negatives for diabetes include no blurred vision, no chest pain, no foot ulcerations, no polydipsia, no visual change and no weight loss. Symptoms are stable. Diabetic complications include autonomic neuropathy and a CVA. Risk factors for coronary artery disease include hypertension, sedentary lifestyle and dyslipidemia. Current diabetic treatment includes insulin injections and oral agent (dual therapy). She is compliant with treatment all of the time. She is following a diabetic diet. When asked about meal planning, she reported none. She has had a previous visit with a dietitian. Her breakfast blood glucose is taken between 7-8 am. Her breakfast blood glucose range is generally 70-90 mg/dl. (Over the past week) An ACE inhibitor/angiotensin II  receptor blocker is contraindicated. She does not see a podiatrist.Eye exam is current.  Hypertension  This is a chronic problem. The current episode started more than 1 year ago. The problem has been gradually worsening since onset. The problem is uncontrolled. Associated symptoms include peripheral edema. Pertinent negatives include no anxiety, blurred vision, chest pain, headaches, neck pain, palpitations or sweats. Past treatments include calcium channel blockers and diuretics. Hypertensive end-organ damage includes CVA.     Past Medical History:  Diagnosis Date  . Asthma   . DDD (degenerative disc disease), cervical   . Diabetes mellitus   . Diabetes mellitus ([place])   . GERD (gastroesophageal reflux disease)   . Hypertension   . Hypertension   . Neuropathy ([place])   . Stroke [facility])    Social History   Social History  . Marital status: Single    Spouse name: N/A  . Number of children: 3  . Years of education: 12+   Occupational History  . [employer]     Social History Main Topics  . Smoking status: Never Smoker  . Smokeless tobacco: Never Used  . Alcohol use No  . Drug use: No  . Sexual activity: Not on file   Other Topics Concern  . Not on file   Social History Narrative   Lives with daughter   Caffeine use: daily   Immunization History  Administered Date(s) Administered  . Influenza,inj,Quad PF,36+ Mos 05/05/2015, 03/23/2016  . Pneumococcal Polysaccharide-23 05/05/2015   Past Surgical History:  Procedure Laterality Date  . ADENOIDECTOMY    . APPENDECTOMY    . CERVICAL SPINE SURGERY  06/2012   C5-C6  . SHOULDER SURGERY    . TONSILLECTOMY     Review of Systems  Constitutional: Positive for fatigue. Negative for weight  loss.  HENT: Negative.   Eyes: Negative.  Negative for blurred vision.  Respiratory: Negative.   Cardiovascular: Negative.  Negative for chest pain, palpitations and leg swelling.  Gastrointestinal: Negative.   Endocrine: Positive  for polyphagia. Negative for polydipsia.  Genitourinary: Negative.  Negative for flank pain, frequency and urgency.  Musculoskeletal: Negative.  Negative for neck pain.       Left foot pain/neuropathy  Skin: Negative.  Negative for pallor.       Left buttocks abscess  Allergic/Immunologic: Negative.  Negative for immunocompromised state.  Neurological: Positive for weakness and loss of balance. Negative for tremors, seizures, speech difficulty and headaches.       Neuropathy  Hematological: Negative.   Psychiatric/Behavioral: Positive for memory loss. Negative for confusion, sleep disturbance and suicidal ideas. The patient is not nervous/anxious.        Objective:   Physical Exam  Constitutional: She is oriented to person, place, and time. She appears well-developed and well-nourished.  HENT:  Head: Normocephalic and atraumatic.  Right Ear: External ear normal.  Left Ear: External ear normal.  Nose: Nose normal.  Mouth/Throat: Oropharynx is clear and moist.  Eyes: Conjunctivae and EOM are normal. Pupils are equal, round, and reactive to light.  Neck: Normal range of motion. Neck supple.  Cardiovascular: Normal rate, regular rhythm, normal heart sounds and intact distal pulses.   Bilateral lower extremity edema (1+)   Pulmonary/Chest: Effort normal and breath sounds normal.  Abdominal: Soft. Bowel sounds are normal.  Musculoskeletal: Normal range of motion.  Right upper extremity (2/5) strength Left upper extremity (3/5) strength   Right lower extremity weakness  Uses cane periodically to assist with ambulation.   Neurological: She is alert and oriented to person, place, and time. She has normal reflexes. She displays no atrophy. No cranial nerve deficit or sensory deficit. She exhibits normal muscle tone. Gait normal.  Skin: Skin is warm.  Psychiatric: She has a normal mood and affect. Her behavior is normal. Judgment and thought content normal.      BP (!) 164/82 (BP  Location: Left Arm, Patient Position: Sitting, Cuff Size: Large)   Temp 98.6 F (37 C) (Oral)   Resp 18   Ht 5' 3.5" (1.613 m)   Wt 180 lb 6.4 oz (81.8 kg)   LMP 06/27/2016   SpO2 100%   BMI 31.46 kg/m  Assessment & Plan:   1. Neuropathy [facility]) Patient has an allergy and her insurance will not cover Lyrica for progressing neuropathy. I will defer to neurology for further work up and evaluation. She has a follow up appointment scheduled.   2. Essential hypertension Blood pressure is above goal on current medication regimen.  She has not started Metoprolol. Also, patient has lower extremity edema. Will stop Amlodipine 10 mg.  Will return in 1 week for blood pressure check. Mild proteinuria present.  - metoprolol succinate (TOPROL-XL) 25 MG 24 hr tablet; Take 1 tablet (25 mg total) by mouth daily.  Dispense: 30 tablet; Refill: 5  3. Type 2 diabetes mellitus without complication, without long-term current use of insulin (HCC) - glucose blood (ACCU-CHEK AVIVA PLUS) test strip; Check blood glucose prior to meals and bedtime  Dispense: 100 each; Refill: 12 - Insulin Pen Needle (BD ULTRA-FINE PEN NEEDLES) 29G X 12.7MM MISC; 1 each by Does not apply route 5 (five) times daily as needed.  Dispense: 100 each; Refill: 11  4. Memory loss, short term  MMSE - Mini Mental State Exam 07/11/2016  Orientation  to time 5  Orientation to Place 5  Registration 3  Attention/ Calculation 5  Recall 2  Language- name 2 objects 2  Language- repeat 1  Language- follow 3 step command 3  Language- read & follow direction 1  Write a sentence 1  Copy design 1  Total score 29    5. Anemia, unspecified type - ferrous sulfate 325 (65 FE) MG tablet; Take 1 tablet (325 mg total) by mouth daily with breakfast.  Dispense: 30 tablet; Refill: 3  MAU:QJFH follow up in 1 week for bp check. 1 month follow up for DMII and hypertension  Hollis,Lachina M, FNP  The patient was given clear instructions to go to ER or  return to medical center if symptoms do not improve, worsen or new problems develop. The patient verbalized understanding. Will notify patient with laboratory results.

## 2016-07-13 ENCOUNTER — Emergency Department (HOSPITAL_COMMUNITY)
Admission: EM | Admit: 2016-07-13 | Discharge: 2016-07-13 | Disposition: A | Discharge status: Home / Self Care | Payer: Medicaid Other | Attending: Emergency Medicine | Admitting: Emergency Medicine

## 2016-07-13 ENCOUNTER — Emergency Department (HOSPITAL_COMMUNITY): Payer: Medicaid Other

## 2016-07-13 ENCOUNTER — Encounter (HOSPITAL_COMMUNITY): Payer: Self-pay | Admitting: Emergency Medicine

## 2016-07-13 DIAGNOSIS — J45901 Unspecified asthma with (acute) exacerbation: Secondary | ICD-10-CM | POA: Diagnosis not present

## 2016-07-13 DIAGNOSIS — Z7982 Long term (current) use of aspirin: Secondary | ICD-10-CM | POA: Diagnosis not present

## 2016-07-13 DIAGNOSIS — E1140 Type 2 diabetes mellitus with diabetic neuropathy, unspecified: Secondary | ICD-10-CM | POA: Diagnosis not present

## 2016-07-13 DIAGNOSIS — Z8673 Personal history of transient ischemic attack (TIA), and cerebral infarction without residual deficits: Secondary | ICD-10-CM | POA: Insufficient documentation

## 2016-07-13 DIAGNOSIS — N183 Chronic kidney disease, stage 3 (moderate): Secondary | ICD-10-CM | POA: Insufficient documentation

## 2016-07-13 DIAGNOSIS — M25572 Pain in left ankle and joints of left foot: Secondary | ICD-10-CM

## 2016-07-13 DIAGNOSIS — Z794 Long term (current) use of insulin: Secondary | ICD-10-CM | POA: Insufficient documentation

## 2016-07-13 DIAGNOSIS — J45909 Unspecified asthma, uncomplicated: Secondary | ICD-10-CM | POA: Diagnosis present

## 2016-07-13 DIAGNOSIS — I129 Hypertensive chronic kidney disease with stage 1 through stage 4 chronic kidney disease, or unspecified chronic kidney disease: Secondary | ICD-10-CM | POA: Diagnosis not present

## 2016-07-13 LAB — BASIC METABOLIC PANEL
ANION GAP: 5 (ref 5–15)
BUN: 23 mg/dL — ABNORMAL HIGH (ref 6–20)
CO2: 28 mmol/L (ref 22–32)
Calcium: 8.9 mg/dL (ref 8.9–10.3)
Chloride: 106 mmol/L (ref 101–111)
Creatinine, Ser: 1.61 mg/dL — ABNORMAL HIGH (ref 0.44–1.00)
GFR calc non Af Amer: 38 mL/min — ABNORMAL LOW (ref >60)
GFR, EST AFRICAN AMERICAN: 44 mL/min — AB (ref >60)
GLUCOSE: 83 mg/dL (ref 65–99)
POTASSIUM: 3.9 mmol/L (ref 3.5–5.1)
Sodium: 139 mmol/L (ref 135–145)

## 2016-07-13 LAB — CBC
HEMATOCRIT: 31.2 % — AB (ref 36.0–46.0)
HEMOGLOBIN: 10.7 g/dL — AB (ref 12.0–15.0)
MCH: 29.2 pg (ref 26.0–34.0)
MCHC: 34.3 g/dL (ref 30.0–36.0)
MCV: 85.2 fL (ref 78.0–100.0)
Platelets: 266 10*3/uL (ref 150–400)
RBC: 3.66 MIL/uL — AB (ref 3.87–5.11)
RDW: 13.2 % (ref 11.5–15.5)
WBC: 5.6 10*3/uL (ref 4.0–10.5)

## 2016-07-13 MED ORDER — KETOROLAC TROMETHAMINE 60 MG/2ML IM SOLN
30.0000 mg | Freq: Once | INTRAMUSCULAR | Status: AC
  Administered 2016-07-13: 30 mg via INTRAMUSCULAR
  Filled 2016-07-13: qty 2

## 2016-07-13 NOTE — Discharge Instructions (Signed)
Read the information below.  You may return to the Emergency Department at any time for worsening condition or any new symptoms that concern you.    If you develop uncontrolled pain, weakness or numbness of the extremity, severe discoloration of the skin, or you are unable to walk or move your foot, return to the ER for a recheck.     If you develop worsening shortness of breath, uncontrolled wheezing, severe chest pain, or fevers despite using tylenol and/or ibuprofen, return for a recheck.

## 2016-07-13 NOTE — Progress Notes (Signed)
Orthopedic Tech Progress Note Patient Details:  Christina Orozco 03/07/1971 [id number]  Ortho Devices Type of Ortho Device: ASO Ortho Device/Splint Interventions: Application   Charlott Rakes 07/13/2016, 3:22 PM

## 2016-07-13 NOTE — ED Provider Notes (Signed)
[facility] DEPT Provider Note   CSN: [id number] Arrival date & time: 07/13/16  1572     History   Chief Complaint Chief Complaint  Patient presents with  . Ankle Pain  . Asthma    HPI Christina Orozco is a 46 y.o. female.  HPI   Pt with hx HTN, DM, asthma, CKD, stroke, neuropathy p/w multiple complaints.    Primarily pt came to ED for stabbing left lateral ankle pain that occurred this morning while at rest.  The pain is still present and sore to the touch.  Denies new shoes, change in activity, any injury.    Pt also notes today she had an episode while on the phone of feeling SOB, like she was choking, with burning in her chest and dry cough.  She used her inhaler 4 times and the symptoms mostly resolved.  She currently feels some slight heaviness in her chest.  Denies fevers, myalgias, other URI symptoms.  While in the ED she has started developing and left frontal headache.  Has been having headaches every other day of the past 1-2 weeks.  Has seen PCP for same and has follow up with neurology.  Took her regular morning meds, did not take any additional medications for her symptoms.    Past Medical History:  Diagnosis Date  . Asthma   . DDD (degenerative disc disease), cervical   . Diabetes mellitus   . Diabetes mellitus ([place])   . GERD (gastroesophageal reflux disease)   . Hypertension   . Hypertension   . Neuropathy ([place])   . Stroke [facility])     Patient Active Problem List   Diagnosis Date Noted  . Neuropathy ([place]) 06/07/2016  . Gait instability   . Lesion of pons   . Slurred speech   . Type 2 diabetes mellitus with hyperosmolar nonketotic hyperglycemia ([place]) 04/30/2016  . Dizziness 04/30/2016  . Hyperglycemia 04/30/2016  . Chronic kidney disease (CKD), stage III (moderate) 04/30/2016  . Ataxia 04/30/2016  . Chronic right shoulder pain 04/03/2016  . 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 Type 2 diabetes mellitus with diabetic neuropathy, with  long-term current use of insulin ([place]) 11/16/2015  . Rhinitis, allergic 08/25/2015  . Asthma, mild intermittent 07/14/2015  . Gastroesophageal reflux disease without esophagitis 06/08/2015  . Alopecia 06/08/2015  . Type 2 diabetes mellitus without complication, without long-term current use of insulin ([place]) 05/05/2015  . Essential hypertension 05/05/2015  . Vitamin D deficiency 05/05/2015    Past Surgical History:  Procedure Laterality Date  . ADENOIDECTOMY    . APPENDECTOMY    . CERVICAL SPINE SURGERY  06/2012   C5-C6  . SHOULDER SURGERY    . TONSILLECTOMY      OB History    No data available       Home Medications    Prior to Admission medications   Medication Sig Start Date End Date Taking? Authorizing Provider  albuterol (PROVENTIL HFA;VENTOLIN HFA) 108 (90 Base) MCG/ACT inhaler Inhale 1-2 puffs into the lungs every 6 (six) hours as needed for wheezing or shortness of breath. 07/13/15  Yes Dorena Dew, FNP  Aspirin (ASPIR-81 PO) Take 81 mg by mouth daily.    Yes Historical Provider, MD  atorvastatin (LIPITOR) 40 MG tablet Take 1 tablet (40 mg total) by mouth daily at 6 PM. Patient taking differently: Take 40 mg by mouth every morning.  05/03/16  Yes Charlynne Cousins, MD  ferrous sulfate 325 (65 FE) MG tablet Take 1 tablet (  325 mg total) by mouth daily with breakfast. 07/11/16  Yes Dorena Dew, FNP  hydrochlorothiazide (HYDRODIURIL) 25 MG tablet Take 25 mg by mouth daily. 06/07/16  Yes Historical Provider, MD  insulin aspart (NOVOLOG) 100 UNIT/ML FlexPen Inject 10 Units into the skin 3 (three) times daily with meals. 07/08/15  Yes Dorena Dew, FNP  insulin detemir (LEVEMIR) 100 UNIT/ML injection Inject 0.15 mLs (15 Units total) into the skin 2 (two) times daily. Patient taking differently: Inject 20 Units into the skin 2 (two) times daily.  07/06/15  Yes Dorena Dew, FNP  Lancet Devices [facility]) lancets Use as instructed 05/16/16  Yes Micheline Chapman, NP  metoprolol succinate (TOPROL-XL) 25 MG 24 hr tablet Take 1 tablet (25 mg total) by mouth daily. Patient taking differently: Take 25 mg by mouth every evening.  07/11/16  Yes Dorena Dew, FNP  Olopatadine HCl (PATADAY) 0.2 % SOLN Apply 1 drop to eye daily. Patient taking differently: Apply 1 drop to eye daily as needed.  08/25/15  Yes Dorena Dew, FNP  omeprazole (PRILOSEC) 40 MG capsule Take 1 capsule (40 mg total) by mouth daily. Reported on 06/08/2015 06/08/15  Yes Dorena Dew, FNP  tiZANidine (ZANAFLEX) 4 MG tablet Take 4 mg by mouth 2 (two) times daily as needed for muscle spasms.    Yes Historical Provider, MD  triamterene-hydrochlorothiazide (MAXZIDE-25) 37.5-25 MG tablet Take 1 tablet by mouth daily. 06/07/16  Yes Dorena Dew, FNP    Family History Family History  Problem Relation Age of Onset  . Heart disease    . Cancer      colon, stomach, lung  . Diabetes    . Seizures      Social History Social History  Substance Use Topics  . Smoking status: Never Smoker  . Smokeless tobacco: Never Used  . Alcohol use No     Allergies   Lisinopril; Penicillins; and Gabapentin   Review of Systems Review of Systems  All other systems reviewed and are negative.    Physical Exam Updated Vital Signs BP 177/83 (BP Location: Left Arm)   Pulse 77   Temp 99 F (37.2 C) (Oral)   Resp 15   Ht 5' 2.5" (1.588 m)   Wt 84.6 kg   LMP 06/27/2016   SpO2 100%   BMI 33.57 kg/m   Physical Exam  Constitutional: She appears well-developed and well-nourished. No distress.  HENT:  Head: Normocephalic and atraumatic.  Mouth/Throat: Uvula is midline, oropharynx is clear and moist and mucous membranes are normal. No oropharyngeal exudate, posterior oropharyngeal edema, posterior oropharyngeal erythema or tonsillar abscesses.  Eyes: Conjunctivae are normal.  Neck: Normal range of motion. Neck supple.  Cardiovascular: Normal rate and regular rhythm.     Pulmonary/Chest: Effort normal and breath sounds normal. No stridor. No respiratory distress. She has no wheezes. She has no rales.  Musculoskeletal: She exhibits no edema.       Left ankle: She exhibits no swelling, no ecchymosis, no deformity, no laceration and normal pulse. Tenderness. Achilles tendon exhibits no pain and no defect.       Left lower leg: Normal.       Left foot: Normal.       Feet:  Left ankle without erythema, edema, warmth.  No areas of fluctuance or induration.  No skin changes.    Lymphadenopathy:    She has no cervical adenopathy.  Neurological: She is alert.  Skin: She is not diaphoretic.  Nursing note and vitals reviewed.    ED Treatments / Results  Labs (all labs ordered are listed, but only abnormal results are displayed) Labs Reviewed  BASIC METABOLIC PANEL - Abnormal; Notable for the following:       Result Value   BUN 23 (*)    Creatinine, Ser 1.61 (*)    GFR calc non Af Amer 38 (*)    GFR calc Af Amer 44 (*)    All other components within normal limits  CBC - Abnormal; Notable for the following:    RBC 3.66 (*)    Hemoglobin 10.7 (*)    HCT 31.2 (*)    All other components within normal limits    EKG  EKG Interpretation None       Radiology Dg Chest 2 View  Result Date: 07/13/2016 CLINICAL DATA:  Chest pain and shortness of breath. EXAM: CHEST  2 VIEW COMPARISON:  07/21/2015 FINDINGS: The heart size and mediastinal contours are within normal limits. Both lungs are clear. The visualized skeletal structures are unremarkable. IMPRESSION: No active cardiopulmonary disease. Electronically Signed   By: Kerby Moors M.D.   On: 07/13/2016 14:11   Dg Ankle Complete Left  Result Date: 07/13/2016 CLINICAL DATA:  Left anterior ankle pain beginning this morning. Stabbing pain. No reported injury. EXAM: LEFT ANKLE COMPLETE - 3+ VIEW COMPARISON:  None. FINDINGS: No fracture.  No bone lesion. The ankle joint is normally spaced and aligned. No  arthropathic change. The soft tissues are unremarkable. There is a small plantar calcaneal spur. IMPRESSION: No fracture, bone lesion or ankle joint abnormality. Electronically Signed   By: Lajean Manes M.D.   On: 07/13/2016 14:10    Procedures Procedures (including critical care time)  Medications Ordered in ED Medications  ketorolac (TORADOL) injection 30 mg (30 mg Intramuscular Given 07/13/16 1440)     Initial Impression / Assessment and Plan / ED Course  I have reviewed the triage vital signs and the nursing notes.  Pertinent labs & imaging results that were available during my care of the patient were reviewed by me and considered in my medical decision making (see chart for details).     Afebrile, nontoxic patient with multiple complaints.  Has atraumatic left ankle pain that began today.  Tender anterior to lateral malleolus.  Xray negative.  Pt also with what may have been asthma attack, largely resolved.  CXR negative.  EKG nonischemic.   Labs remarkable for anemia, renal insufficiency that is about baseline.   Pt also with chronic headaches with headache similar to her typical headache.  Toradol IM given in ED (30mg ), ASO placed.  D/C home with PCP follow up.  Discussed result, findings, treatment, and follow up  with patient.  Pt given return precautions.  Pt verbalizes understanding and agrees with plan.       Final Clinical Impressions(s) / ED Diagnoses   Final diagnoses:  Acute left ankle pain  Exacerbation of asthma, unspecified asthma severity, unspecified whether persistent    New Prescriptions New Prescriptions   No medications on file     Clayton Bibles, PA-C 07/13/16 [place], MD 07/15/16 2030

## 2016-07-13 NOTE — ED Triage Notes (Addendum)
Pt reports she began to have L anterior ankle pain radiating up calf 45 minutes ago. No known injury. Hx of stroke on this side. Also complains of SOB/asthma that began 30 minutes ago. Some dizziness and nausea. No CP. No obvious wheezing

## 2016-07-13 NOTE — ED Notes (Signed)
Bed: WA20 Expected date:  Expected time:  Means of arrival:  Comments: 

## 2016-07-19 ENCOUNTER — Other Ambulatory Visit: Payer: Self-pay | Admitting: Family Medicine

## 2016-07-19 DIAGNOSIS — R6889 Other general symptoms and signs: Secondary | ICD-10-CM

## 2016-07-19 MED ORDER — OSELTAMIVIR PHOSPHATE 75 MG PO CAPS: 75.0000 mg | ORAL_CAPSULE | Freq: Two times a day (BID) | ORAL | 0 refills | Status: AC

## 2016-07-19 NOTE — Progress Notes (Signed)
Ms. Shatora Weatherbee, a 46 year old female calls office complaining of flu like symptoms including cough, headache, and fever. She has taken OTC Tylenol and increased fluid intake.   Recommend that patient increases rest, hand washing, and fluid intake.   Meds ordered this encounter  Medications  . oseltamivir (TAMIFLU) 75 MG capsule    Sig: Take 1 capsule (75 mg total) by mouth 2 (two) times daily.    Dispense:  10 capsule    Refill:  0    Jeweline Reif M, FNP

## 2016-08-23 ENCOUNTER — Other Ambulatory Visit: Payer: Self-pay | Admitting: Family Medicine

## 2016-08-23 DIAGNOSIS — Z1231 Encounter for screening mammogram for malignant neoplasm of breast: Secondary | ICD-10-CM

## 2016-09-06 ENCOUNTER — Emergency Department (HOSPITAL_COMMUNITY)
Admission: EM | Admit: 2016-09-06 | Discharge: 2016-09-07 | Disposition: A | Discharge status: Home / Self Care | Payer: Medicaid Other | Attending: Emergency Medicine | Admitting: Emergency Medicine

## 2016-09-06 ENCOUNTER — Emergency Department (HOSPITAL_COMMUNITY): Payer: Medicaid Other

## 2016-09-06 DIAGNOSIS — J45909 Unspecified asthma, uncomplicated: Secondary | ICD-10-CM | POA: Diagnosis not present

## 2016-09-06 DIAGNOSIS — Z794 Long term (current) use of insulin: Secondary | ICD-10-CM | POA: Diagnosis not present

## 2016-09-06 DIAGNOSIS — R109 Unspecified abdominal pain: Secondary | ICD-10-CM | POA: Diagnosis present

## 2016-09-06 DIAGNOSIS — Z7982 Long term (current) use of aspirin: Secondary | ICD-10-CM | POA: Insufficient documentation

## 2016-09-06 DIAGNOSIS — N183 Chronic kidney disease, stage 3 (moderate): Secondary | ICD-10-CM | POA: Diagnosis not present

## 2016-09-06 DIAGNOSIS — Z79899 Other long term (current) drug therapy: Secondary | ICD-10-CM | POA: Diagnosis not present

## 2016-09-06 DIAGNOSIS — E1122 Type 2 diabetes mellitus with diabetic chronic kidney disease: Secondary | ICD-10-CM | POA: Insufficient documentation

## 2016-09-06 DIAGNOSIS — N3001 Acute cystitis with hematuria: Secondary | ICD-10-CM | POA: Diagnosis not present

## 2016-09-06 DIAGNOSIS — I129 Hypertensive chronic kidney disease with stage 1 through stage 4 chronic kidney disease, or unspecified chronic kidney disease: Secondary | ICD-10-CM | POA: Diagnosis not present

## 2016-09-06 LAB — URINALYSIS, ROUTINE W REFLEX MICROSCOPIC
BILIRUBIN URINE: NEGATIVE
Glucose, UA: >=500 mg/dL — AB
KETONES UR: NEGATIVE mg/dL
Nitrite: NEGATIVE
PH: 5 (ref 5.0–8.0)
PROTEIN: 100 mg/dL — AB
SPECIFIC GRAVITY, URINE: 1.014 (ref 1.005–1.030)

## 2016-09-06 LAB — COMPREHENSIVE METABOLIC PANEL
ALT: 15 U/L (ref 14–54)
ANION GAP: 9 (ref 5–15)
AST: 14 U/L — ABNORMAL LOW (ref 15–41)
Albumin: 4.3 g/dL (ref 3.5–5.0)
Alkaline Phosphatase: 79 U/L (ref 38–126)
BUN: 21 mg/dL — ABNORMAL HIGH (ref 6–20)
CHLORIDE: 103 mmol/L (ref 101–111)
CO2: 24 mmol/L (ref 22–32)
Calcium: 9.7 mg/dL (ref 8.9–10.3)
Creatinine, Ser: 1.32 mg/dL — ABNORMAL HIGH (ref 0.44–1.00)
GFR, EST AFRICAN AMERICAN: 56 mL/min — AB (ref >60)
GFR, EST NON AFRICAN AMERICAN: 49 mL/min — AB (ref >60)
Glucose, Bld: 275 mg/dL — ABNORMAL HIGH (ref 65–99)
Potassium: 3.5 mmol/L (ref 3.5–5.1)
SODIUM: 136 mmol/L (ref 135–145)
Total Bilirubin: 0.7 mg/dL (ref 0.3–1.2)
Total Protein: 8.2 g/dL — ABNORMAL HIGH (ref 6.5–8.1)

## 2016-09-06 LAB — CBC
HCT: 33.2 % — ABNORMAL LOW (ref 36.0–46.0)
Hemoglobin: 11.6 g/dL — ABNORMAL LOW (ref 12.0–15.0)
MCH: 28.5 pg (ref 26.0–34.0)
MCHC: 34.9 g/dL (ref 30.0–36.0)
MCV: 81.6 fL (ref 78.0–100.0)
PLATELETS: 284 10*3/uL (ref 150–400)
RBC: 4.07 MIL/uL (ref 3.87–5.11)
RDW: 12.5 % (ref 11.5–15.5)
WBC: 9.6 10*3/uL (ref 4.0–10.5)

## 2016-09-06 LAB — PREGNANCY, URINE: Preg Test, Ur: NEGATIVE

## 2016-09-06 LAB — LIPASE, BLOOD: Lipase: 22 U/L (ref 11–51)

## 2016-09-06 MED ORDER — ONDANSETRON HCL 4 MG/2ML IJ SOLN
4.0000 mg | Freq: Once | INTRAMUSCULAR | Status: AC
  Administered 2016-09-06: 4 mg via INTRAVENOUS
  Filled 2016-09-06: qty 2

## 2016-09-06 MED ORDER — SODIUM CHLORIDE 0.9 % IV BOLUS (SEPSIS)
1000.0000 mL | Freq: Once | INTRAVENOUS | Status: AC
  Administered 2016-09-06: 1000 mL via INTRAVENOUS

## 2016-09-06 MED ORDER — HYDROMORPHONE HCL 1 MG/ML IJ SOLN
1.0000 mg | Freq: Once | INTRAMUSCULAR | Status: AC
  Administered 2016-09-06: 1 mg via INTRAVENOUS
  Filled 2016-09-06: qty 1

## 2016-09-06 MED ORDER — ONDANSETRON 4 MG PO TBDP
4.0000 mg | ORAL_TABLET | Freq: Once | ORAL | Status: AC | PRN
  Administered 2016-09-06: 4 mg via ORAL
  Filled 2016-09-06: qty 1

## 2016-09-06 NOTE — ED Triage Notes (Signed)
Pt complains of right sided abdominal pain, 3 episodes of emesis and 2 episodes of diarrhea today. Pain radiates to right flank. Pt had appendix removed in 2004.

## 2016-09-06 NOTE — ED Provider Notes (Signed)
[facility] DEPT Provider Note   CSN: [id number] Arrival date & time: 09/06/16  2057 By signing my name below, I, Georgette Shell, attest that this documentation has been prepared under the direction and in the presence of Charlann Lange, PA-C. Electronically Signed: Georgette Shell, ED Scribe. 09/06/16. 11:19 PM.  History   Chief Complaint Chief Complaint  Patient presents with  . Abdominal Pain  . Emesis  . Diarrhea    HPI The history is provided by the patient. No language interpreter was used.   HPI Comments: Christina Orozco is a 46 y.o. female with h/o asthma, DM, GERD, HTN, and CVA, who presents to the Emergency Department complaining of right-sided abdominal pain radiating to the right flank beginning around 6 pm today. Pt also has associated vomiting (x3 episodes) and diarrhea (x2 episodes). She was driving at the time of onset. She states she's had this pain prior in 2004; she had a ruptured appendix and kidney stones. Pt has h/o appendectomy. Pt denies fever, chills, dysuria, or any other associated symptoms.  Past Medical History:  Diagnosis Date  . Asthma   . DDD (degenerative disc disease), cervical   . Diabetes mellitus   . Diabetes mellitus ([place])   . GERD (gastroesophageal reflux disease)   . Hypertension   . Hypertension   . Neuropathy ([place])   . Stroke [facility])     Patient Active Problem List   Diagnosis Date Noted  . Neuropathy ([place]) 06/07/2016  . Gait instability   . Lesion of pons   . Slurred speech   . Type 2 diabetes mellitus with hyperosmolar nonketotic hyperglycemia ([place]) 04/30/2016  . Dizziness 04/30/2016  . Hyperglycemia 04/30/2016  . Chronic kidney disease (CKD), stage III (moderate) 04/30/2016  . Ataxia 04/30/2016  . Chronic right shoulder pain 04/03/2016  . 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 Type 2 diabetes mellitus with diabetic neuropathy, with long-term current use of insulin ([name]) 11/16/2015  . Rhinitis, allergic 08/25/2015  .  Asthma, mild intermittent 07/14/2015  . Gastroesophageal reflux disease without esophagitis 06/08/2015  . Alopecia 06/08/2015  . Type 2 diabetes mellitus without complication, without long-term current use of insulin ([place]) 05/05/2015  . Essential hypertension 05/05/2015  . Vitamin D deficiency 05/05/2015    Past Surgical History:  Procedure Laterality Date  . ADENOIDECTOMY    . APPENDECTOMY    . CERVICAL SPINE SURGERY  06/2012   C5-C6  . SHOULDER SURGERY    . TONSILLECTOMY      OB History    No data available       Home Medications    Prior to Admission medications   Medication Sig Start Date End Date Taking? Authorizing Provider  Aspirin (ASPIR-81 PO) Take 81 mg by mouth daily.    Yes Historical Provider, MD  atorvastatin (LIPITOR) 40 MG tablet Take 1 tablet (40 mg total) by mouth daily at 6 PM. Patient taking differently: Take 40 mg by mouth every morning.  05/03/16  Yes Charlynne Cousins, MD  ferrous sulfate 325 (65 FE) MG tablet Take 1 tablet (325 mg total) by mouth daily with breakfast. 07/11/16  Yes Dorena Dew, FNP  insulin aspart (NOVOLOG) 100 UNIT/ML FlexPen Inject 10 Units into the skin 3 (three) times daily with meals. 07/08/15  Yes Dorena Dew, FNP  insulin detemir (LEVEMIR) 100 UNIT/ML injection Inject 0.15 mLs (15 Units total) into the skin 2 (two) times daily. Patient taking differently: Inject 20 Units into the skin 2 (two) times daily.  07/06/15  Yes Dorena Dew, FNP  metoprolol succinate (TOPROL-XL) 25 MG 24 hr tablet Take 1 tablet (25 mg total) by mouth daily. 07/11/16  Yes Dorena Dew, FNP  omeprazole (PRILOSEC) 40 MG capsule Take 1 capsule (40 mg total) by mouth daily. Reported on 06/08/2015 06/08/15  Yes Dorena Dew, FNP  triamterene-hydrochlorothiazide (MAXZIDE-25) 37.5-25 MG tablet Take 1 tablet by mouth daily. 06/07/16  Yes Dorena Dew, FNP  albuterol (PROVENTIL HFA;VENTOLIN HFA) 108 (90 Base) MCG/ACT inhaler Inhale 1-2 puffs  into the lungs every 6 (six) hours as needed for wheezing or shortness of breath. 07/13/15   Dorena Dew, FNP  Lancet Devices [facility]) lancets Use as instructed 05/16/16   Micheline Chapman, NP  Olopatadine HCl (PATADAY) 0.2 % SOLN Apply 1 drop to eye daily. Patient taking differently: Apply 1 drop to eye daily as needed.  08/25/15   Dorena Dew, FNP  tiZANidine (ZANAFLEX) 4 MG tablet Take 4 mg by mouth 2 (two) times daily as needed for muscle spasms.     Historical Provider, MD    Family History Family History  Problem Relation Age of Onset  . Heart disease    . Cancer      colon, stomach, lung  . Diabetes    . Seizures      Social History Social History  Substance Use Topics  . Smoking status: Never Smoker  . Smokeless tobacco: Never Used  . Alcohol use No     Allergies   Lisinopril; Penicillins; and Gabapentin   Review of Systems Review of Systems  Constitutional: Negative for chills and fever.  Respiratory: Negative for shortness of breath.   Cardiovascular: Negative for chest pain.  Gastrointestinal: Positive for abdominal pain, diarrhea, nausea and vomiting.  Genitourinary: Negative for dysuria, vaginal bleeding and vaginal discharge.  Musculoskeletal: Positive for back pain.  Neurological: Negative for weakness and light-headedness.     Physical Exam Updated Vital Signs BP (!) 198/105 (BP Location: Left Arm)   Pulse 90   Temp 99 F (37.2 C) (Oral)   Resp 18   Ht 5' 3.5" (1.613 m)   Wt 179 lb (81.2 kg)   SpO2 99%   BMI 31.21 kg/m   Physical Exam  Constitutional: She appears well-developed and well-nourished.  Pt is uncomfortable appearing.  HENT:  Head: Normocephalic.  Eyes: Conjunctivae are normal.  Cardiovascular: Normal rate.   Pulmonary/Chest: Effort normal. No respiratory distress.  Abdominal: Bowel sounds are normal. She exhibits no distension. There is tenderness.  Diffuse abdominal pain, greatest in the RLQ.    Genitourinary:  Genitourinary Comments: No CVA tenderness.   Musculoskeletal: Normal range of motion.  There is low, right back tenderness. No swelling.   Neurological: She is alert.  Skin: Skin is warm and dry.  Psychiatric: She has a normal mood and affect. Her behavior is normal.  Nursing note and vitals reviewed.    ED Treatments / Results  DIAGNOSTIC STUDIES: Oxygen Saturation is 99% on RA, normal by my interpretation.   COORDINATION OF CARE: 11:19 PM-Discussed next steps with pt. Pt verbalized understanding and is agreeable with the plan.   Labs (all labs ordered are listed, but only abnormal results are displayed) Labs Reviewed  COMPREHENSIVE METABOLIC PANEL - Abnormal; Notable for the following:       Result Value   Glucose, Bld 275 (*)    BUN 21 (*)    Creatinine, Ser 1.32 (*)    Total Protein 8.2 (*)  AST 14 (*)    GFR calc non Af Amer 49 (*)    GFR calc Af Amer 56 (*)    All other components within normal limits  CBC - Abnormal; Notable for the following:    Hemoglobin 11.6 (*)    HCT 33.2 (*)    All other components within normal limits  LIPASE, BLOOD  URINALYSIS, ROUTINE W REFLEX MICROSCOPIC  POC URINE PREG, ED    EKG  EKG Interpretation None       Radiology No results found.  Procedures Procedures (including critical care time)  Medications Ordered in ED Medications  ondansetron (ZOFRAN-ODT) disintegrating tablet 4 mg (4 mg Oral Given 09/06/16 2221)     Initial Impression / Assessment and Plan / ED Course  I have reviewed the triage vital signs and the nursing notes.  Pertinent labs & imaging results that were available during my care of the patient were reviewed by me and considered in my medical decision making (see chart for details).     Patient with RLQ abdominal pain through to the back. She has evidence of UTI. She has had kidney stones in the past and, in the setting of UTI, ureteral stone was evaluated and ruled negative by  CT. IV Rocephin provided. She can be discharged home on abx and encouraged to follow up with PCP as needed.  Final Clinical Impressions(s) / ED Diagnoses   Final diagnoses:  None   1. UTI  New Prescriptions New Prescriptions   No medications on file   I personally performed the services described in this documentation, which was scribed in my presence. The recorded information has been reviewed and is accurate.      Charlann Lange, PA-C 09/07/16 [place], PA-C 09/07/16 [place], MD 09/07/16 [phone]

## 2016-09-07 LAB — POC URINE PREG, ED: PREG TEST UR: NEGATIVE

## 2016-09-07 MED ORDER — TRAMADOL HCL 50 MG PO TABS: 50.0000 mg | ORAL_TABLET | Freq: Three times a day (TID) | ORAL | 0 refills | Status: DC | PRN

## 2016-09-07 MED ORDER — CEFTRIAXONE SODIUM 1 G IJ SOLR
1.0000 g | Freq: Once | INTRAMUSCULAR | Status: AC
  Administered 2016-09-07: 1 g via INTRAVENOUS
  Filled 2016-09-07: qty 10

## 2016-09-07 MED ORDER — PHENAZOPYRIDINE HCL 200 MG PO TABS: 200.0000 mg | ORAL_TABLET | Freq: Three times a day (TID) | ORAL | 0 refills | Status: DC

## 2016-09-07 MED ORDER — CEPHALEXIN 500 MG PO CAPS: 500.0000 mg | ORAL_CAPSULE | Freq: Four times a day (QID) | ORAL | 0 refills | Status: DC

## 2016-09-07 NOTE — ED Notes (Signed)
Pt ambulatory and independent at discharge.  Verbalized understanding of discharge instructions 

## 2016-09-12 ENCOUNTER — Other Ambulatory Visit: Payer: Self-pay | Admitting: Family Medicine

## 2016-09-12 ENCOUNTER — Ambulatory Visit: Payer: Medicaid Other

## 2016-09-12 DIAGNOSIS — J302 Other seasonal allergic rhinitis: Secondary | ICD-10-CM

## 2016-09-12 MED ORDER — LEVOCETIRIZINE DIHYDROCHLORIDE 5 MG PO TABS: 5.0000 mg | ORAL_TABLET | Freq: Every evening | ORAL | 5 refills | Status: DC

## 2016-09-12 NOTE — Progress Notes (Signed)
Called and spoke with patient. Advised that xyzal had been sent into pharmacy and to take as directed. Patient verbalized understanding. Thanks!

## 2016-09-25 ENCOUNTER — Ambulatory Visit: Payer: Medicaid Other | Admitting: Neurology

## 2016-09-25 ENCOUNTER — Telehealth: Payer: Self-pay

## 2016-09-25 NOTE — Telephone Encounter (Signed)
Today's appt r/s to next Friday d/t no power @ [facility].

## 2016-09-30 ENCOUNTER — Other Ambulatory Visit: Payer: Self-pay | Admitting: Family Medicine

## 2016-09-30 DIAGNOSIS — I10 Essential (primary) hypertension: Secondary | ICD-10-CM

## 2016-10-02 ENCOUNTER — Ambulatory Visit
Admission: RE | Admit: 2016-10-02 | Discharge: 2016-10-02 | Disposition: A | Discharge status: Home / Self Care | Payer: Medicaid Other | Source: Ambulatory Visit | Attending: Family Medicine | Admitting: Family Medicine

## 2016-10-02 DIAGNOSIS — Z1231 Encounter for screening mammogram for malignant neoplasm of breast: Secondary | ICD-10-CM

## 2016-10-05 ENCOUNTER — Ambulatory Visit (INDEPENDENT_AMBULATORY_CARE_PROVIDER_SITE_OTHER): Payer: Medicaid Other | Admitting: Physician Assistant

## 2016-10-06 ENCOUNTER — Encounter: Payer: Self-pay | Admitting: Family Medicine

## 2016-10-06 ENCOUNTER — Ambulatory Visit: Payer: Self-pay | Admitting: Neurology

## 2016-10-06 ENCOUNTER — Ambulatory Visit (INDEPENDENT_AMBULATORY_CARE_PROVIDER_SITE_OTHER): Payer: Medicaid Other | Admitting: Family Medicine

## 2016-10-06 ENCOUNTER — Other Ambulatory Visit: Payer: Self-pay | Admitting: Family Medicine

## 2016-10-06 VITALS — BP 132/70 | HR 76 | Temp 98.1°F | Resp 16 | Ht 63.5 in | Wt 183.0 lb

## 2016-10-06 DIAGNOSIS — I10 Essential (primary) hypertension: Secondary | ICD-10-CM | POA: Diagnosis not present

## 2016-10-06 DIAGNOSIS — B9689 Other specified bacterial agents as the cause of diseases classified elsewhere: Secondary | ICD-10-CM | POA: Diagnosis not present

## 2016-10-06 DIAGNOSIS — E1101 Type 2 diabetes mellitus with hyperosmolarity with coma: Secondary | ICD-10-CM | POA: Diagnosis not present

## 2016-10-06 DIAGNOSIS — N898 Other specified noninflammatory disorders of vagina: Secondary | ICD-10-CM

## 2016-10-06 DIAGNOSIS — G4700 Insomnia, unspecified: Secondary | ICD-10-CM

## 2016-10-06 DIAGNOSIS — B373 Candidiasis of vulva and vagina: Secondary | ICD-10-CM

## 2016-10-06 DIAGNOSIS — B3731 Acute candidiasis of vulva and vagina: Secondary | ICD-10-CM

## 2016-10-06 DIAGNOSIS — Z23 Encounter for immunization: Secondary | ICD-10-CM | POA: Diagnosis not present

## 2016-10-06 DIAGNOSIS — E119 Type 2 diabetes mellitus without complications: Secondary | ICD-10-CM

## 2016-10-06 DIAGNOSIS — N760 Acute vaginitis: Secondary | ICD-10-CM | POA: Diagnosis not present

## 2016-10-06 DIAGNOSIS — E1100 Type 2 diabetes mellitus with hyperosmolarity without nonketotic hyperglycemic-hyperosmolar coma (NKHHC): Secondary | ICD-10-CM

## 2016-10-06 DIAGNOSIS — L298 Other pruritus: Secondary | ICD-10-CM

## 2016-10-06 LAB — GLUCOSE, CAPILLARY
Glucose-Capillary: 355 mg/dL — ABNORMAL HIGH (ref 65–99)
Glucose-Capillary: 252 mg/dL — ABNORMAL HIGH (ref 65–99)

## 2016-10-06 LAB — POCT URINALYSIS DIP (DEVICE)
Bilirubin Urine: NEGATIVE
GLUCOSE, UA: 500 mg/dL — AB
KETONES UR: NEGATIVE mg/dL
Leukocytes, UA: NEGATIVE
Nitrite: NEGATIVE
PROTEIN: 100 mg/dL — AB
UROBILINOGEN UA: 0.2 mg/dL (ref 0.0–1.0)
pH: 5.5 (ref 5.0–8.0)

## 2016-10-06 LAB — COMPLETE METABOLIC PANEL WITH GFR
AG Ratio: 1.1 Ratio (ref 1.0–2.5)
ALBUMIN: 3.5 g/dL — AB (ref 3.6–5.1)
ALT: 8 U/L (ref 6–29)
AST: 9 U/L — AB (ref 10–30)
Alkaline Phosphatase: 56 U/L (ref 33–115)
BUN/Creatinine Ratio: 14.7 Ratio (ref 6–22)
BUN: 19 mg/dL (ref 7–25)
CALCIUM: 8.8 mg/dL (ref 8.6–10.2)
CO2: 23 mmol/L (ref 20–31)
Chloride: 104 mmol/L (ref 98–110)
Creat: 1.29 mg/dL — ABNORMAL HIGH (ref 0.50–1.10)
GFR, Est African American: 59 mL/min — ABNORMAL LOW (ref >=60)
GFR, Est Non African American: 51 mL/min — ABNORMAL LOW (ref >=60)
GLOBULIN: 3.1 g/dL (ref 1.9–3.7)
Glucose, Bld: 263 mg/dL — ABNORMAL HIGH (ref 65–99)
POTASSIUM: 4.1 mmol/L (ref 3.5–5.3)
Sodium: 136 mmol/L (ref 135–146)
Total Bilirubin: 0.3 mg/dL (ref 0.2–1.2)
Total Protein: 6.6 g/dL (ref 6.1–8.1)

## 2016-10-06 LAB — POCT GLYCOSYLATED HEMOGLOBIN (HGB A1C): Hemoglobin A1C: 11.8

## 2016-10-06 MED ORDER — TRIAMTERENE-HCTZ 50-25 MG PO CAPS: 1.0000 | ORAL_CAPSULE | ORAL | 2 refills | Status: DC

## 2016-10-06 MED ORDER — FLUCONAZOLE 150 MG PO TABS: 150.0000 mg | ORAL_TABLET | Freq: Once | ORAL | 0 refills | Status: AC

## 2016-10-06 MED ORDER — CLONIDINE HCL 0.1 MG PO TABS
0.2000 mg | ORAL_TABLET | Freq: Once | ORAL | Status: AC
  Administered 2016-10-06: 0.2 mg via ORAL

## 2016-10-06 MED ORDER — TRAZODONE HCL 50 MG PO TABS: 25.0000 mg | ORAL_TABLET | Freq: Every evening | ORAL | 0 refills | Status: DC | PRN

## 2016-10-06 MED ORDER — INSULIN DETEMIR 100 UNIT/ML ~~LOC~~ SOLN: 20.0000 [IU] | Freq: Two times a day (BID) | SUBCUTANEOUS | 11 refills | Status: DC

## 2016-10-06 MED ORDER — METRONIDAZOLE 0.75 % EX GEL: 1.0000 "application " | Freq: Two times a day (BID) | CUTANEOUS | 0 refills | Status: DC

## 2016-10-06 MED ORDER — ZOSTER VACCINE LIVE 19400 UNT/0.65ML ~~LOC~~ SUSR: 0.6500 mL | Freq: Once | SUBCUTANEOUS | 0 refills | Status: AC

## 2016-10-06 MED ORDER — METRONIDAZOLE 500 MG PO TABS: 500.0000 mg | ORAL_TABLET | Freq: Two times a day (BID) | ORAL | 0 refills | Status: DC

## 2016-10-06 MED ORDER — NON FORMULARY
10.0000 [IU] | Freq: Once | Status: AC
  Administered 2016-10-06: 10 [IU] via SUBCUTANEOUS

## 2016-10-06 NOTE — Progress Notes (Signed)
Subjective:    Patient ID: Christina Orozco, female    DOB: 08-Sep-1970, 46 y.o.   MRN: [id number] [name], a 46 year old female with a history of uncontrolled diabetes mellitus, uncontrolled hypertension, insomnia, and anxiety presents for a follow up of chronic conditions. She says that she has been taking all medications consistently. He blood pressure and glucose were markedly elevated on arrival. She reports increased family and job stressors over the past several months.  Diabetes Mellitus:  Ms. [place] glucose was 355 on arrival. She had breakfast greater than 6 hours ago. She says that she has not taken medications as prescribed over the past several days due to increased stress. She endorses polyuria. Her previous hemoglobin a1c was 9.8.  Patient denies foot ulcerations, polydipsia, visual disturbances, vomitting and weight loss.    Hypertension: Patient here for follow-up of elevated blood pressure. She is not exercising and is not adherent to low salt diet.  She does not check blood pressure at home. Patient denies chest pain, dyspnea, irregular heart beat, palpitations and tachypnea.  Cardiovascular risk factors include: diabetes mellitus, dyslipidemia, microalbuminuria, obesity (BMI >= 30 kg/m2) and sedentary lifestyle.   Anxiety: Patient complains of anxiety disorder and sleep disturbance. She reports increased stressors at home and work.  She has the following symptoms: chest pain, insomnia, palpitations, paresthesias.  She denies current suicidal and homicidal ideation.    HPI Past Medical History:  Diagnosis Date  . Asthma   . DDD (degenerative disc disease), cervical   . Diabetes mellitus   . Diabetes mellitus ([place])   . GERD (gastroesophageal reflux disease)   . Hypertension   . Hypertension   . Neuropathy   . Stroke [facility])    Social History   Social History  . Marital status: Single    Spouse name: N/A  . Number of children: 3  . Years of education:  12+   Occupational History  . [employer]     Social History Main Topics  . Smoking status: Never Smoker  . Smokeless tobacco: Never Used  . Alcohol use No  . Drug use: No  . Sexual activity: Not on file   Other Topics Concern  . Not on file   Social History Narrative   Lives with daughter   Caffeine use: daily    Review of Systems  Constitutional: Positive for fatigue. Negative for fever and unexpected weight change.  HENT: Negative.   Eyes: Negative.   Respiratory: Negative.   Cardiovascular: Negative.  Negative for palpitations and leg swelling.  Gastrointestinal: Negative.   Endocrine: Positive for polyuria. Negative for polydipsia and polyphagia.  Genitourinary: Negative.   Musculoskeletal: Negative.  Negative for joint swelling and myalgias.  Skin: Negative.   Allergic/Immunologic: Negative.   Neurological: Positive for numbness.  Hematological: Negative.   Psychiatric/Behavioral: Positive for sleep disturbance. The patient is nervous/anxious and has insomnia.        Objective:   Physical Exam  Constitutional: She is oriented to person, place, and time. She appears well-developed and well-nourished.  HENT:  Head: Normocephalic and atraumatic.  Right Ear: External ear normal.  Left Ear: External ear normal.  Nose: Nose normal.  Mouth/Throat: Oropharynx is clear and moist.  Eyes: Conjunctivae are normal. Pupils are equal, round, and reactive to light.  Neck: Normal range of motion. Neck supple.  Cardiovascular: Normal rate, regular rhythm, normal heart sounds and intact distal pulses.   Pulmonary/Chest: Effort normal and breath sounds normal.  Abdominal: Soft.  Bowel sounds are normal.  Musculoskeletal: Normal range of motion.  Neurological: She is alert and oriented to person, place, and time. She has normal reflexes.  Skin: Skin is warm and dry.  Psychiatric: She has a normal mood and affect. Her behavior is normal. Judgment and thought content  normal.      BP 132/70 (BP Location: Right Arm, Patient Position: Sitting, Cuff Size: Large)   Pulse 76   Temp 98.1 F (36.7 C) (Oral)   Resp 16   Ht 5' 3.5" (1.613 m)   Wt 183 lb (83 kg)   LMP 09/19/2016   SpO2 100%   BMI 31.91 kg/m   Assessment & Plan:  1. Type 2 diabetes mellitus with hyperosmolar nonketotic hyperglycemia (HCC) There have been some compliance issues here. I have discussed with her the great importance of following the treatment plan exactly as directed in order to achieve a good medical outcome. Given 10 units of Humalog in office. Blood sugar decrease to 252 after 15 minutes.  - Glucose, capillary - HgB A1c - POCT urinalysis dip (device) - insulin detemir (LEVEMIR) 100 UNIT/ML injection; Inject 0.2 mLs (20 Units total) into the skin 2 (two) times daily.  Dispense: 1 vial; Refill: 11 - COMPLETE METABOLIC PANEL WITH GFR - NON FORMULARY 10 Units; Inject 10 Units into the skin once.  2. Essential hypertension Blood pressure decreased to 132/70 following Clonidine 0.2 mg. Will increase triamterene-hydrochlorothiazide. Also discussed the importance of taking medication consistently. The patient is asked to make an attempt to improve diet and exercise patterns to aid in medical management of this problem.  - cloNIDine (CATAPRES) tablet 0.2 mg; Take 2 tablets (0.2 mg total) by mouth once. - triamterene-hydrochlorothiazide (DYAZIDE) 50-25 MG capsule; Take 1 capsule by mouth every morning.  Dispense: 30 capsule; Refill: 2  3. Insomnia, unspecified type - traZODone (DESYREL) 50 MG tablet; Take 0.5 tablets (25 mg total) by mouth at bedtime as needed for sleep.  Dispense: 30 tablet; Refill: 0  4. Vaginal itching - POCT Wet Prep [facility])  5. Vaginal yeast infection 2-5 yeasts per HPF - fluconazole (DIFLUCAN) 150 MG tablet; Take 1 tablet (150 mg total) by mouth once.  Dispense: 1 tablet; Refill: 0  6. Bacterial vaginitis  - metroNIDAZOLE (FLAGYL) 500 MG tablet;  Take 1 tablet (500 mg total) by mouth 2 (two) times daily.  Dispense: 14 tablet; Refill: 0  7. Immunization due - Zoster Vaccine Live, PF, (ZOSTAVAX) 17001 UNT/0.65ML injection; Inject 19,400 Units into the skin once.  Dispense: 1 each; Refill: 0    RTC: 1 month for DMII and hypertension   [name]  MSN, FNP-C [facility] [address] [place], [place] [address] [phone]

## 2016-10-06 NOTE — Patient Instructions (Addendum)
Diabetes mellitus:  10 units of Humalog in office Will increase Levemir to 25 units twice daily Continue Novolog for meal coverage  Hypertension:  Will increase Diazide to 50-25 mg daily Continue Metoprolol as previously prescribee   Vaginal Yeast Infection:   Diflucan 150 mg once  Bacterial Vaginosis:   Metronidazole gel 1 application twice daily for 5 days    Bacterial Vaginosis Bacterial vaginosis is an infection of the vagina. It happens when too many germs (bacteria) grow in the vagina. This infection puts you at risk for infections from sex (STIs). Treating this infection can lower your risk for some STIs. You should also treat this if you are pregnant. It can cause your baby to be born early. Follow these instructions at home: Medicines   Take over-the-counter and prescription medicines only as told by your doctor.  Take or use your antibiotic medicine as told by your doctor. Do not stop taking or using it even if you start to feel better. General instructions   If you your sexual partner is a woman, tell her that you have this infection. She needs to get treatment if she has symptoms. If you have a female partner, he does not need to be treated.  During treatment:  Avoid sex.  Do not douche.  Avoid alcohol as told.  Avoid breastfeeding as told.  Drink enough fluid to keep your pee (urine) clear or pale yellow.  Keep your vagina and butt (rectum) clean.  Wash the area with warm water every day.  Wipe from front to back after you use the toilet.  Keep all follow-up visits as told by your doctor. This is important. Preventing this condition   Do not douche.  Use only warm water to wash around your vagina.  Use protection when you have sex. This includes:  Latex condoms.  Dental dams.  Limit how many people you have sex with. It is best to only have sex with the same person (be monogamous).  Get tested for STIs. Have your partner get tested.  Wear  underwear that is cotton or lined with cotton.  Avoid tight pants and pantyhose. This is most important in summer.  Do not use any products that have nicotine or tobacco in them. These include cigarettes and e-cigarettes. If you need help quitting, ask your doctor.  Do not use illegal drugs.  Limit how much alcohol you drink. Contact a doctor if:  Your symptoms do not get better, even after you are treated.  You have more discharge or pain when you pee (urinate).  You have a fever.  You have pain in your belly (abdomen).  You have pain with sex.  Your bleed from your vagina between periods. Summary  This infection happens when too many germs (bacteria) grow in the vagina.  Treating this condition can lower your risk for some infections from sex (STIs).  You should also treat this if you are pregnant. It can cause early (premature) birth.  Do not stop taking or using your antibiotic medicine even if you start to feel better. This information is not intended to replace advice given to you by your health care provider. Make sure you discuss any questions you have with your health care provider. Document Released: 03/07/2008 Document Revised: 02/12/2016 Document Reviewed: 02/12/2016 Elsevier Interactive Patient Education  2017 Elsevier Inc.  Vaginal Yeast infection, Adult Vaginal yeast infection is a condition that causes soreness, swelling, and redness (inflammation) of the vagina. It also causes vaginal discharge. This  is a common condition. Some women get this infection frequently. What are the causes? This condition is caused by a change in the normal balance of the yeast (candida) and bacteria that live in the vagina. This change causes an overgrowth of yeast, which causes the inflammation. What increases the risk? This condition is more likely to develop in:  Women who take antibiotic medicines.  Women who have diabetes.  Women who take birth control pills.  Women  who are pregnant.  Women who douche often.  Women who have a weak defense (immune) system.  Women who have been taking steroid medicines for a long time.  Women who frequently wear tight clothing. What are the signs or symptoms? Symptoms of this condition include:  White, thick vaginal discharge.  Swelling, itching, redness, and irritation of the vagina. The lips of the vagina (vulva) may be affected as well.  Pain or a burning feeling while urinating.  Pain during sex. How is this diagnosed? This condition is diagnosed with a medical history and physical exam. This will include a pelvic exam. Your health care provider will examine a sample of your vaginal discharge under a microscope. Your health care provider may send this sample for testing to confirm the diagnosis. How is this treated? This condition is treated with medicine. Medicines may be over-the-counter or prescription. You may be told to use one or more of the following:  Medicine that is taken orally.  Medicine that is applied as a cream.  Medicine that is inserted directly into the vagina (suppository). Follow these instructions at home:  Take or apply over-the-counter and prescription medicines only as told by your health care provider.  Do not have sex until your health care provider has approved. Tell your sex partner that you have a yeast infection. That person should go to his or her health care provider if he or she develops symptoms.  Do not wear tight clothes, such as pantyhose or tight pants.  Avoid using tampons until your health care provider approves.  Eat more yogurt. This may help to keep your yeast infection from returning.  Try taking a sitz bath to help with discomfort. This is a warm water bath that is taken while you are sitting down. The water should only come up to your hips and should cover your buttocks. Do this 3-4 times per day or as told by your health care provider.  Do not  douche.  Wear breathable, cotton underwear.  If you have diabetes, keep your blood sugar levels under control. Contact a health care provider if:  You have a fever.  Your symptoms go away and then return.  Your symptoms do not get better with treatment.  Your symptoms get worse.  You have new symptoms.  You develop blisters in or around your vagina.  You have blood coming from your vagina and it is not your menstrual period.  You develop pain in your abdomen. This information is not intended to replace advice given to you by your health care provider. Make sure you discuss any questions you have with your health care provider. Document Released: 03/08/2005 Document Revised: 11/10/2015 Document Reviewed: 11/30/2014 Elsevier Interactive Patient Education  2017 [employer].

## 2016-10-08 DIAGNOSIS — N760 Acute vaginitis: Secondary | ICD-10-CM

## 2016-10-08 DIAGNOSIS — B9689 Other specified bacterial agents as the cause of diseases classified elsewhere: Secondary | ICD-10-CM | POA: Insufficient documentation

## 2016-10-08 DIAGNOSIS — G4700 Insomnia, unspecified: Secondary | ICD-10-CM | POA: Insufficient documentation

## 2016-10-09 LAB — POCT WET PREP (WET MOUNT)
Clue Cells Wet Prep Whiff POC: NEGATIVE
TRICHOMONAS WET PREP HPF POC: ABSENT

## 2016-10-13 ENCOUNTER — Telehealth: Payer: Self-pay

## 2016-10-13 ENCOUNTER — Other Ambulatory Visit: Payer: Self-pay | Admitting: Family Medicine

## 2016-10-13 DIAGNOSIS — I10 Essential (primary) hypertension: Secondary | ICD-10-CM

## 2016-10-13 MED ORDER — INSULIN DETEMIR 100 UNIT/ML FLEXPEN: 20.0000 [IU] | Freq: Two times a day (BID) | SUBCUTANEOUS | 11 refills | Status: DC

## 2016-10-13 MED ORDER — TRIAMTERENE-HCTZ 50-25 MG PO CAPS: 1.0000 | ORAL_CAPSULE | Freq: Every day | ORAL | 2 refills | Status: DC

## 2016-10-16 ENCOUNTER — Other Ambulatory Visit: Payer: Self-pay

## 2016-10-16 ENCOUNTER — Telehealth: Payer: Self-pay

## 2016-10-16 MED ORDER — INSULIN DETEMIR 100 UNIT/ML FLEXPEN: 20.0000 [IU] | PEN_INJECTOR | Freq: Two times a day (BID) | SUBCUTANEOUS | 11 refills | Status: DC

## 2016-10-16 NOTE — Telephone Encounter (Signed)
Called and spoke with patient and suggested that she try another retail pharmacy or the health department. Explained that the suggested age is 33+ but according to the cdc web site there is no minimum age to receive the vaccine. Advised patient that this is an issue with administering the vaccine in office with insurance not wanting to pay, that's why we have recommend that she receive vaccine at retail pharmacy. Patient verbalized understanding.   While I was on the phone with patient she asked that her levemir be switched to the flexpen ( she has always used the flexpen but was prescribed the vial when in office last week) I have changed this and sent into pharmacy. Thanks!

## 2016-10-18 ENCOUNTER — Telehealth: Payer: Self-pay

## 2016-10-18 ENCOUNTER — Other Ambulatory Visit: Payer: Self-pay | Admitting: Family Medicine

## 2016-10-18 DIAGNOSIS — I10 Essential (primary) hypertension: Secondary | ICD-10-CM

## 2016-10-18 DIAGNOSIS — E119 Type 2 diabetes mellitus without complications: Secondary | ICD-10-CM

## 2016-10-18 MED ORDER — TRIAMTERENE-HCTZ 50-25 MG PO CAPS: 1.0000 | ORAL_CAPSULE | Freq: Every day | ORAL | 2 refills | Status: DC

## 2016-10-18 MED ORDER — BD LANCET ULTRAFINE 30G MISC: 1.0000 | Freq: Two times a day (BID) | 5 refills | Status: DC

## 2016-10-18 NOTE — Telephone Encounter (Signed)
[place], Pharmacy ([facility])  called and states that the Dyazide 50/25mg  is not available right now, the only strength that is currently available is the 37.5/25mg . Would you like to change this to the lower strength or change the medication? Please advise.

## 2016-10-21 ENCOUNTER — Other Ambulatory Visit: Payer: Self-pay | Admitting: Family Medicine

## 2016-10-21 DIAGNOSIS — I10 Essential (primary) hypertension: Secondary | ICD-10-CM

## 2016-10-21 MED ORDER — METOPROLOL SUCCINATE ER 50 MG PO TB24: 50.0000 mg | ORAL_TABLET | Freq: Every day | ORAL | 1 refills | Status: DC

## 2016-10-21 MED ORDER — TRIAMTERENE-HCTZ 37.5-25 MG PO CAPS: 1.0000 | ORAL_CAPSULE | Freq: Every day | ORAL | 1 refills | Status: DC

## 2016-10-21 NOTE — Progress Notes (Signed)
Meds ordered this encounter  Medications  . metoprolol succinate (TOPROL-XL) 50 MG 24 hr tablet    Sig: Take 1 tablet (50 mg total) by mouth daily.    Dispense:  30 tablet    Refill:  1  . triamterene-hydrochlorothiazide (DYAZIDE) 37.5-25 MG capsule    Sig: Take 1 each (1 capsule total) by mouth daily.    Dispense:  30 capsule    Refill:  1  Discussed medication regimen with patient at length. She expressed understanding. She is to follow up in clinic for a blood pressure check as scheduled.    [name]  MSN, FNP-C [facility] [address] [place], [place] [address] [phone]

## 2016-10-23 ENCOUNTER — Other Ambulatory Visit: Payer: Self-pay

## 2016-10-24 ENCOUNTER — Ambulatory Visit (INDEPENDENT_AMBULATORY_CARE_PROVIDER_SITE_OTHER): Payer: Medicaid Other | Admitting: Neurology

## 2016-10-24 ENCOUNTER — Encounter: Payer: Self-pay | Admitting: Neurology

## 2016-10-24 VITALS — BP 175/99 | HR 77 | Ht 63.5 in | Wt 180.8 lb

## 2016-10-24 DIAGNOSIS — G372 Central pontine myelinolysis: Secondary | ICD-10-CM | POA: Diagnosis not present

## 2016-10-24 DIAGNOSIS — I6930 Unspecified sequelae of cerebral infarction: Secondary | ICD-10-CM

## 2016-10-24 NOTE — Progress Notes (Signed)
[id number]    Provider:  Dr Jaynee Eagles Referring Provider: Dorena Dew, FNP Primary Care Physician:  Dorena Dew, FNP  CC:  Gait abnormality  Interval History 10/24/2016: Walking is better, sensory symptoms are better. Trying to control her DM and blood pressure. Has a lot of stress in her life. She is having eye complications due to her diabetes and HTN.   HPI:  Christina Orozco is a 46 y.o. female here as a referral from Dr. Smith Robert for gait abnormality. PMHx uncontrolled HTN, uncontrolled diabetes. 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  MRA was performed which showed no evidence for high grade stenosis being involved in her condition. She ws drinking a lot of sodas, she was extremely thirsty, the next day she went to church and she was slurring and couldn't walk. She is feeling better. She is having problems with walking and with vision. Mostly up close vision. Right arm pain resolved with "shot in the arm" in the shoulder, she went to orthopaedics and was treated for right shoulder. No dizziness. Some confusion but overall doing really well. Still some imbalance.   MRI brain 04/30/2016:  Interval history 01/26/2016: Patient here for follow up. She had right arm pain with 2 negative EMG/NCS no etiology found except bilateral CTS. MRI of the brain showed abnormal white matter disease likely due to her uncontrolled vascular risk factors but was periventricular and concerning for demyelination, LP for other causes such as MS negative but need repeat MRI to follow especially given continued symptoms. She has uncontrolled HTN today in the office 440 systolic, uncontrolled [id number] 12-14 and eating [employer] in the clinic exam room. She has pain in the elbow, pain in the right shoulder, she still has weakness sin the right arm.  She has headaches and vision changes. Vision worsening in the right eye. She got new glasses and she still can't see. She has double vision and blurry vision.   VQQ:VZDGLOVFI Christina Orozco a 46 y.o.femalehere as a referral from Dr. Dayton Martes right hand numbness and paresthesias. PMHx HTN, DM, asthma, neuropathy, cervical degenerative disease s/p acdf.No falls.Numbnees and tingling in the right arm started at 6am yesterday morning and it hasn't stopped. She has numbness in the entire right arm all the way up toe shoulder. She woke up with the pain, the whole arm. At first she thought it was coming from the shoulder, now not sure. She does have shoulder problems, has been hurting for years and she has had surgery on it. She is pulling and yanking on it at work, heat on the shoulder didn't help. She is having weakness in the hand. She is not using the hand to drive. She has significant pain 10/10. Numb and tinlging like when her feet fall asleep. She gets sharp radiating pain in the wrist and shoot upwards. Weakness in the grip. 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 Never had any numbness or tingling in the hand in the past. It is the whole hand even the palm No numbness or weakness of legs.No headache, slurred speech, She has no history of TIA or stroke. No head injuries or falls. No anti-coagulant use.  Reviewed notes, labs and imaging from outside physicians, which showed:  Personally reviewed imaging and agree with the following:  MRI HEAD IMPRESSION:  1. No acute intracranial process. 2. Patchy T2/FLAIR hyperintense foci within the periventricular and deep white matter both cerebral 6 hemispheres, mildly advanced for patient age. These foci are somewhat nonspecific, with differential considerations including accelerated/hereditary small vessel ischemia (favored), sequela of prior trauma, hypercoagulable  state, vasculitis, migraines, prior infection, or possibly underlying demyelinating disease.  MRI CERVICAL IMPRESSION:  1. No acute abnormality within the cervical spine. 2. Status post ACDF at F0-2 without complication. 3. Minimal multilevel degenerative disc disease as above. There is resultant mild bilateral foraminal narrowing at C3-4. No other significant stenosis within the cervical spine. 4. 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  She presented to the ED on 07/03/2014 and woke up with the symptoms. The paresthesias extended into the forearm.Denied weakness. No falls or inciting factors. She was discharged after negative MRI of the brain and cervical cord. Referred to neurology OP. Glucose was in the 400s. She denies falling asleep in a position that would hurt the arm or in a way that would compress the axilla or any part of the arm. Startes she did not sleep on it at all.   Review of Systems: Patient complains of symptoms per HPI as well as the following symptoms: memory loss, confusion, numbness, headache, slurred speech, dizziness. Pertinent negatives per HPI. All others negative.   Social History   Social History  . Marital status: Single    Spouse name: N/A  . Number of children: 3  . Years of education: 12+   Occupational History  . [employer]     Social History Main Topics  . Smoking status: Never Smoker  . Smokeless tobacco: Never Used  . Alcohol use No  . Drug use: No  . Sexual activity: Not on file   Other Topics Concern  . Not on file   Social History Narrative   Lives with daughter   Caffeine use: daily    Family History  Problem Relation Age of Onset  . Vascular Disease Mother   . Heart disease Unknown   . Cancer Unknown        colon, stomach, lung  . Diabetes Unknown   . Seizures Unknown   . Breast cancer Sister 19    Past Medical History:  Diagnosis Date  . Asthma   . DDD (degenerative disc disease), cervical   . Diabetes mellitus   .  Diabetes mellitus ([place])   . GERD (gastroesophageal reflux disease)   . Hypertension   . Hypertension   . Neuropathy   . Stroke [facility])     Past Surgical History:  Procedure Laterality Date  . ADENOIDECTOMY    . APPENDECTOMY    . CERVICAL SPINE SURGERY  06/2012   C5-C6  . SHOULDER SURGERY    . TONSILLECTOMY      Current Outpatient Prescriptions  Medication Sig Dispense Refill  . albuterol (PROVENTIL HFA;VENTOLIN HFA) 108 (90 Base) MCG/ACT inhaler Inhale 1-2 puffs into the lungs every 6 (six) hours as needed for wheezing or shortness of breath. 1 Inhaler 1  . Aspirin (ASPIR-81 PO) Take 81 mg by mouth daily.     Marland Kitchen atorvastatin (LIPITOR) 40 MG tablet Take 1 tablet (40 mg total) by mouth daily at 6 PM. (Patient taking differently: Take 40 mg by mouth every morning. ) 30 tablet 3  . ferrous sulfate 325 (65 FE) MG tablet Take 1 tablet (325 mg total) by mouth daily with breakfast. 30 tablet 3  . insulin aspart (NOVOLOG) 100 UNIT/ML FlexPen Inject 10 Units into  the skin 3 (three) times daily with meals. 15 mL 11  . Insulin Detemir (LEVEMIR FLEXTOUCH) 100 UNIT/ML Pen Inject 20 Units into the skin 2 (two) times daily. 15 mL 11  . Lancet Devices (ACCU-CHEK SOFTCLIX) lancets Use as instructed 100 each 3  . Lancets (BD LANCET ULTRAFINE 30G) MISC 1 each by Does not apply route 2 (two) times daily. 100 each 5  . metoprolol succinate (TOPROL-XL) 50 MG 24 hr tablet Take 1 tablet (50 mg total) by mouth daily. 30 tablet 1  . Olopatadine HCl (PATADAY) 0.2 % SOLN Apply 1 drop to eye daily. (Patient taking differently: Apply 1 drop to eye daily as needed. ) 1 Bottle 0  . omeprazole (PRILOSEC) 40 MG capsule Take 1 capsule (40 mg total) by mouth daily. Reported on 06/08/2015 30 capsule 5  . tiZANidine (ZANAFLEX) 4 MG tablet Take 4 mg by mouth 2 (two) times daily as needed for muscle spasms.     . traZODone (DESYREL) 50 MG tablet Take 0.5 tablets (25 mg total) by mouth at bedtime as needed for sleep. 30 tablet 0   . triamterene-hydrochlorothiazide (DYAZIDE) 37.5-25 MG capsule Take 1 each (1 capsule total) by mouth daily. 30 capsule 1   No current facility-administered medications for this visit.     Allergies as of 10/24/2016 - Review Complete 10/24/2016  Allergen Reaction Noted  . Lisinopril Swelling and Other (See Comments) 03/23/2016  . Penicillins Hives 03/07/2012  . Gabapentin Swelling and Other (See Comments) 07/11/2016    Vitals: BP (!) 175/99   Pulse 77   Ht 5' 3.5" (1.613 m)   Wt 180 lb 12.8 oz (82 kg)   BMI 31.52 kg/m  Last Weight:  Wt Readings from Last 1 Encounters:  10/24/16 180 lb 12.8 oz (82 kg)   Last Height:   Ht Readings from Last 1 Encounters:  10/24/16 5' 3.5" (1.613 m)    Speech: Speech is normal; fluent and spontaneous with normal comprehension.  Cognition: The patient is oriented to person, place, and time;  Cranial Nerves:  Extraocular movements are intact Decreased left face to LT. ,The muscles of mastication are normal. The face is symmetric. The palate elevates in the midline. Hearing intact. Voice is normal. Shoulder shrug is normal. The tongue has normal motion without fasciculations.   Coordination: Normal finger to nose and heel to shin.  Gait: Good stride, can heel-toe and tandem without imbalance.  Motor Observation: No asymmetry, no atrophy, and no involuntary movements noted. Tone: Normal muscle tone.   Posture: Posture is normal. normal erect  Strength: 5/5 strength.    Sensation: intact pin prick, temperature and vibration distally in the feet. Decreased left arm and face, intact legs. Negative Romberg.   Reflex Exam:  DTR's: Deep tendon reflexes in the upper and lower extremities are normal bilaterally, reflexes in the AJs 1+.  Toes: Right down, left equiv Clonus: Clonus is absent.   Assessment/Plan:45 y.o. female here as a referral from Dr. Smith Robert originally seen for for right hand  numbness and paresthesias which have resolved completely. PMHx HTN, DM, asthma, neuropathy, cervical degenerative disease s/p acdf, uncontrolled diabetes and HTN.   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  She is actually doing extremely well, resolved imbalance.   Discussed: Lacunar infarct due to uncontrolled vascular risk factors: ldl 60. Continue ASA daily. I had a long d/w patient about her stroke, risk for  recurrent stroke/TIAs, personally independently reviewed imaging studies and stroke evaluation results and answered questions.Continue ASA for secondary stroke prevention and maintain strict control of hypertension with blood pressure goal below 130/90, diabetes with hemoglobin A1c goal below 6.5% and lipids with LDL cholesterol goal below 70 mg/dL.I also advised the patient to eat a healthy diet with plenty of whole grains, cereals, fruits and vegetables, exercise regularly and maintain ideal body weight .Followup in the future with me in 2 months or call earlier if necessary.    Sarina Ill, MD  [facility] [address] [place], [place] [address]  Phone [phone] Fax [phone]  A total of 15 minutes was spent face-to-face with this patient. Over half this time was spent on counseling patient on the lacunar infarct, central pontine myelinosis diagnosis and different diagnostic and therapeutic options available.

## 2016-10-24 NOTE — Patient Instructions (Signed)
Remember to drink plenty of fluid, eat healthy meals and do not skip any meals. Try to eat protein with a every meal and eat a healthy snack such as fruit or nuts in between meals. Try to keep a regular sleep-wake schedule and try to exercise daily, particularly in the form of walking, 20-30 minutes a day, if you can.   I would like to see you back as needed, sooner if we need to. Please call us with any interim questions, concerns, problems, updates or refill requests.   Our phone number is [phone]. We also have an after hours call service for urgent matters and there is a physician on-call for urgent questions. For any emergencies you know to call 911 or go to the nearest emergency room   

## 2016-11-09 ENCOUNTER — Ambulatory Visit: Payer: Medicaid Other | Admitting: Family Medicine

## 2016-11-15 ENCOUNTER — Emergency Department (HOSPITAL_COMMUNITY)
Admission: EM | Admit: 2016-11-15 | Discharge: 2016-11-15 | Disposition: A | Discharge status: Home / Self Care | Payer: Medicaid Other | Attending: Emergency Medicine | Admitting: Emergency Medicine

## 2016-11-15 ENCOUNTER — Emergency Department (HOSPITAL_COMMUNITY): Payer: Medicaid Other

## 2016-11-15 ENCOUNTER — Encounter (HOSPITAL_COMMUNITY): Payer: Self-pay | Admitting: *Deleted

## 2016-11-15 DIAGNOSIS — N183 Chronic kidney disease, stage 3 (moderate): Secondary | ICD-10-CM | POA: Insufficient documentation

## 2016-11-15 DIAGNOSIS — R0789 Other chest pain: Secondary | ICD-10-CM

## 2016-11-15 DIAGNOSIS — Z7982 Long term (current) use of aspirin: Secondary | ICD-10-CM | POA: Diagnosis not present

## 2016-11-15 DIAGNOSIS — J4520 Mild intermittent asthma, uncomplicated: Secondary | ICD-10-CM | POA: Diagnosis not present

## 2016-11-15 DIAGNOSIS — I129 Hypertensive chronic kidney disease with stage 1 through stage 4 chronic kidney disease, or unspecified chronic kidney disease: Secondary | ICD-10-CM | POA: Diagnosis not present

## 2016-11-15 DIAGNOSIS — M5412 Radiculopathy, cervical region: Secondary | ICD-10-CM

## 2016-11-15 DIAGNOSIS — E1122 Type 2 diabetes mellitus with diabetic chronic kidney disease: Secondary | ICD-10-CM | POA: Insufficient documentation

## 2016-11-15 DIAGNOSIS — Z794 Long term (current) use of insulin: Secondary | ICD-10-CM | POA: Insufficient documentation

## 2016-11-15 DIAGNOSIS — Z79899 Other long term (current) drug therapy: Secondary | ICD-10-CM | POA: Insufficient documentation

## 2016-11-15 DIAGNOSIS — E1100 Type 2 diabetes mellitus with hyperosmolarity without nonketotic hyperglycemic-hyperosmolar coma (NKHHC): Secondary | ICD-10-CM | POA: Diagnosis not present

## 2016-11-15 DIAGNOSIS — E1140 Type 2 diabetes mellitus with diabetic neuropathy, unspecified: Secondary | ICD-10-CM | POA: Diagnosis not present

## 2016-11-15 LAB — BASIC METABOLIC PANEL
Anion gap: 6 (ref 5–15)
BUN: 22 mg/dL — AB (ref 6–20)
CHLORIDE: 104 mmol/L (ref 101–111)
CO2: 24 mmol/L (ref 22–32)
CREATININE: 1.39 mg/dL — AB (ref 0.44–1.00)
Calcium: 9.8 mg/dL (ref 8.9–10.3)
GFR, EST AFRICAN AMERICAN: 53 mL/min — AB (ref >60)
GFR, EST NON AFRICAN AMERICAN: 46 mL/min — AB (ref >60)
Glucose, Bld: 355 mg/dL — ABNORMAL HIGH (ref 65–99)
POTASSIUM: 4 mmol/L (ref 3.5–5.1)
SODIUM: 134 mmol/L — AB (ref 135–145)

## 2016-11-15 LAB — I-STAT BETA HCG BLOOD, ED (MC, WL, AP ONLY)

## 2016-11-15 LAB — CBC
HEMATOCRIT: 32.8 % — AB (ref 36.0–46.0)
Hemoglobin: 11.3 g/dL — ABNORMAL LOW (ref 12.0–15.0)
MCH: 29 pg (ref 26.0–34.0)
MCHC: 34.5 g/dL (ref 30.0–36.0)
MCV: 84.3 fL (ref 78.0–100.0)
PLATELETS: 271 10*3/uL (ref 150–400)
RBC: 3.89 MIL/uL (ref 3.87–5.11)
RDW: 12.6 % (ref 11.5–15.5)
WBC: 7.5 10*3/uL (ref 4.0–10.5)

## 2016-11-15 LAB — I-STAT TROPONIN, ED
Troponin i, poc: 0 ng/mL (ref 0.00–0.08)
Troponin i, poc: 0 ng/mL (ref 0.00–0.08)

## 2016-11-15 LAB — D-DIMER, QUANTITATIVE: D-Dimer, Quant: 0.5 ug/mL-FEU (ref 0.00–0.50)

## 2016-11-15 MED ORDER — FENTANYL CITRATE (PF) 100 MCG/2ML IJ SOLN
50.0000 ug | Freq: Once | INTRAMUSCULAR | Status: AC
  Administered 2016-11-15: 50 ug via INTRAVENOUS
  Filled 2016-11-15: qty 2

## 2016-11-15 MED ORDER — KETOROLAC TROMETHAMINE 15 MG/ML IJ SOLN
15.0000 mg | Freq: Once | INTRAMUSCULAR | Status: AC
  Administered 2016-11-15: 15 mg via INTRAVENOUS
  Filled 2016-11-15: qty 1

## 2016-11-15 MED ORDER — METHYLPREDNISOLONE 4 MG PO TBPK: ORAL_TABLET | ORAL | 0 refills | Status: DC

## 2016-11-15 MED ORDER — DEXAMETHASONE SODIUM PHOSPHATE 10 MG/ML IJ SOLN
10.0000 mg | Freq: Once | INTRAMUSCULAR | Status: AC
Start: 2016-11-15 — End: 2016-11-15
  Administered 2016-11-15: 10 mg via INTRAVENOUS
  Filled 2016-11-15: qty 1

## 2016-11-15 MED ORDER — CYCLOBENZAPRINE HCL 10 MG PO TABS
10.0000 mg | ORAL_TABLET | Freq: Once | ORAL | Status: AC
  Administered 2016-11-15: 10 mg via ORAL
  Filled 2016-11-15: qty 1

## 2016-11-15 MED ORDER — HYDROCODONE-ACETAMINOPHEN 5-325 MG PO TABS: 1.0000 | ORAL_TABLET | ORAL | 0 refills | Status: DC | PRN

## 2016-11-15 NOTE — ED Notes (Signed)
Pt departed in NAD, refused use of wheelchair.  

## 2016-11-15 NOTE — ED Notes (Signed)
EDP made aware of patient blood pressure.   

## 2016-11-15 NOTE — ED Provider Notes (Signed)
[facility] DEPT Provider Note   CSN: [id number] Arrival date & time: 11/15/16  1442     History   Chief Complaint Chief Complaint  Patient presents with  . Chest Pain    HPI Christina Orozco is a 46 y.o. female.  HPI   46 year old female with past medical history of hypertension, diabetes, history of stroke, who presents with chest pain. The patient states that she was well until she had a asthma attack yesterday. She states she started coughing and wheezing and did not have her inhaler. She coughed for approximately 30 minutes until she was able to get her inhaler with relief of the symptoms. She had some mild, diffuse aching body pain after this episode and remained in bed throughout the day. Earlier today, when she awoke, and started getting out of bed, she noticed a sharp, stabbing, shooting pain that shot from her right neck down to her right arm. This lasted approximately one hour. She went to work and then noticed a tight, aching sensation in her left shoulder with sharp pain that radiates down her left arm as well as to her left upper chest with movement. She states the pain is worse with palpation. Denies any associated shortness of breath. She denies any cough. She denies any recent medication changes. No history of coronary disease. Of note, she does have a remote history of blood clot but this was provoked in the setting of OCPs and she is not currently on any estrogen. She does not use blood thinners.  Past Medical History:  Diagnosis Date  . Asthma   . DDD (degenerative disc disease), cervical   . Diabetes mellitus   . Diabetes mellitus ([place])   . GERD (gastroesophageal reflux disease)   . Hypertension   . Hypertension   . Neuropathy   . Stroke [facility])     Patient Active Problem List   Diagnosis Date Noted  . Insomnia 10/08/2016  . Bacterial vaginitis 10/08/2016  . Neuropathy 06/07/2016  . Gait instability   . Lesion of pons   . Slurred speech   . Type 2  diabetes mellitus with hyperosmolar nonketotic hyperglycemia ([place]) 04/30/2016  . Dizziness 04/30/2016  . Hyperglycemia 04/30/2016  . Chronic kidney disease (CKD), stage III (moderate) 04/30/2016  . Ataxia 04/30/2016  . Chronic right shoulder pain 04/03/2016  . 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 Type 2 diabetes mellitus with diabetic neuropathy, with long-term current use of insulin ([place]) 11/16/2015  . Rhinitis, allergic 08/25/2015  . Asthma, mild intermittent 07/14/2015  . Gastroesophageal reflux disease without esophagitis 06/08/2015  . Alopecia 06/08/2015  . Type 2 diabetes mellitus without complication, without long-term current use of insulin ([place]) 05/05/2015  . Essential hypertension 05/05/2015  . Vitamin D deficiency 05/05/2015    Past Surgical History:  Procedure Laterality Date  . ADENOIDECTOMY    . APPENDECTOMY    . CERVICAL SPINE SURGERY  06/2012   C5-C6  . SHOULDER SURGERY    . TONSILLECTOMY      OB History    No data available       Home Medications    Prior to Admission medications   Medication Sig Start Date End Date Taking? Authorizing Provider  albuterol (PROVENTIL HFA;VENTOLIN HFA) 108 (90 Base) MCG/ACT inhaler Inhale 1-2 puffs into the lungs every 6 (six) hours as needed for wheezing or shortness of breath. 07/13/15  Yes Dorena Dew, FNP  Aspirin (ASPIR-81 PO) Take 81 mg by mouth daily.    Yes [provider]  atorvastatin (LIPITOR) 40 MG tablet Take 1 tablet (40 mg total) by mouth daily at 6 PM. Patient taking differently: Take 40 mg by mouth every morning.  05/03/16  Yes Charlynne Cousins, MD  ferrous sulfate 325 (65 FE) MG tablet Take 1 tablet (325 mg total) by mouth daily with breakfast. 07/11/16  Yes Dorena Dew, FNP  insulin aspart (NOVOLOG) 100 UNIT/ML FlexPen Inject 10 Units into the skin 3 (three) times daily with meals. 07/08/15  Yes Dorena Dew, FNP  Insulin Detemir (LEVEMIR FLEXTOUCH) 100 UNIT/ML Pen  Inject 20 Units into the skin 2 (two) times daily. 10/16/16  Yes Dorena Dew, FNP  metoprolol succinate (TOPROL-XL) 50 MG 24 hr tablet Take 1 tablet (50 mg total) by mouth daily. 10/21/16  Yes Dorena Dew, FNP  omeprazole (PRILOSEC) 40 MG capsule Take 1 capsule (40 mg total) by mouth daily. Reported on 06/08/2015 06/08/15  Yes Dorena Dew, FNP  traZODone (DESYREL) 50 MG tablet Take 0.5 tablets (25 mg total) by mouth at bedtime as needed for sleep. 10/06/16  Yes Dorena Dew, FNP  triamterene-hydrochlorothiazide (DYAZIDE) 37.5-25 MG capsule Take 1 each (1 capsule total) by mouth daily. 10/21/16  Yes Dorena Dew, FNP  vitamin C (ASCORBIC ACID) 500 MG tablet Take 500 mg by mouth daily.   Yes [provider]  HYDROcodone-acetaminophen (NORCO/VICODIN) 5-325 MG tablet Take 1-2 tablets by mouth every 4 (four) hours as needed for moderate pain or severe pain. 11/15/16   Duffy Bruce, MD  methylPREDNISolone (MEDROL DOSEPAK) 4 MG TBPK tablet Take as directed 11/15/16   Duffy Bruce, MD  Olopatadine HCl (PATADAY) 0.2 % SOLN Apply 1 drop to eye daily. Patient taking differently: Place 1 drop into both eyes daily as needed (allergies).  08/25/15   Dorena Dew, FNP  tiZANidine (ZANAFLEX) 4 MG tablet Take 4 mg by mouth daily as needed for muscle spasms.     [provider]    Family History Family History  Problem Relation Age of Onset  . Vascular Disease Mother   . Heart disease Unknown   . Cancer Unknown        colon, stomach, lung  . Diabetes Unknown   . Seizures Unknown   . Breast cancer Sister 83    Social History Social History  Substance Use Topics  . Smoking status: Never Smoker  . Smokeless tobacco: Never Used  . Alcohol use No     Allergies   Lisinopril; Penicillins; and Gabapentin   Review of Systems Review of Systems  Constitutional: Positive for fatigue. Negative for chills and fever.  HENT: Negative for congestion, rhinorrhea  and sore throat.   Eyes: Negative for visual disturbance.  Respiratory: Positive for cough. Negative for shortness of breath and wheezing.   Cardiovascular: Positive for chest pain. Negative for leg swelling.  Gastrointestinal: Negative for abdominal pain, diarrhea, nausea and vomiting.  Genitourinary: Negative for dysuria, flank pain, vaginal bleeding and vaginal discharge.  Musculoskeletal: Positive for arthralgias. Negative for neck pain.  Skin: Negative for rash.  Allergic/Immunologic: Negative for immunocompromised state.  Neurological: Negative for syncope and headaches.  Hematological: Does not bruise/bleed easily.  All other systems reviewed and are negative.    Physical Exam Updated Vital Signs BP (!) 192/104   Pulse 81   Temp 98.2 F (36.8 C) (Oral)   Resp 16   Ht 5' 3.5" (1.613 m)   Wt 81.6 kg (180 lb)   LMP 11/02/2016  SpO2 100%   BMI 31.39 kg/m   Physical Exam  Constitutional: She is oriented to person, place, and time. She appears well-developed and well-nourished. No distress.  HENT:  Head: Normocephalic and atraumatic.  Eyes: Conjunctivae are normal.  Neck: Neck supple.  Cardiovascular: Normal rate, regular rhythm and normal heart sounds.  Exam reveals no friction rub.   No murmur heard. Pulmonary/Chest: Effort normal and breath sounds normal. No respiratory distress. She has no wheezes. She has no rales.  Exquisite pinpoint tenderness to palpation of her left upper second and third intercostal spaces in the parasternal area. No bruising or swelling. No erythema.  Abdominal: She exhibits no distension.  Musculoskeletal: She exhibits no edema.       Back:  Significant pinpoint tenderness along the bilateral superior trapezius muscles reproducing her pain.  Neurological: She is alert and oriented to person, place, and time. She exhibits normal muscle tone.  Strength 5/5 bilateral UE proximally and distally. Intact sensation to light touch.  Skin: Skin is  warm. Capillary refill takes less than 2 seconds.  Psychiatric: She has a normal mood and affect.  Nursing note and vitals reviewed.    ED Treatments / Results  Labs (all labs ordered are listed, but only abnormal results are displayed) Labs Reviewed  BASIC METABOLIC PANEL - Abnormal; Notable for the following:       Result Value   Sodium 134 (*)    Glucose, Bld 355 (*)    BUN 22 (*)    Creatinine, Ser 1.39 (*)    GFR calc non Af Amer 46 (*)    GFR calc Af Amer 53 (*)    All other components within normal limits  CBC - Abnormal; Notable for the following:    Hemoglobin 11.3 (*)    HCT 32.8 (*)    All other components within normal limits  D-DIMER, QUANTITATIVE (NOT AT [facility])  I-STAT TROPOININ, ED  I-STAT BETA HCG BLOOD, ED ([facility], [facility], [facility] ONLY)  I-STAT TROPOININ, ED    EKG  EKG Interpretation  Date/Time:  Wednesday November 15 2016 14:47:11 EDT Ventricular Rate:  92 PR Interval:  136 QRS Duration: 80 QT Interval:  384 QTC Calculation: 474 R Axis:   80 Text Interpretation:  Normal sinus rhythm Nonspecific ST abnormality Abnormal ECG No significant change since last tracing Baseline wander Confirmed by Duffy Bruce [phone]) on 11/15/2016 3:38:46 PM       Radiology Dg Chest 2 View  Result Date: 11/15/2016 CLINICAL DATA:  Chest pain. EXAM: CHEST  2 VIEW COMPARISON:  07/13/2016 . FINDINGS: Mediastinum hilar structures normal. Lungs are clear. No pleural effusion pneumothorax. Heart size normal. Prior cervical spine fusion. IMPRESSION: No acute cardiopulmonary disease. Electronically Signed   By: Marcello Moores  Register   On: 11/15/2016 15:43    Procedures Procedures (including critical care time)  Medications Ordered in ED Medications  fentaNYL (SUBLIMAZE) injection 50 mcg (50 mcg Intravenous Given 11/15/16 1632)  cyclobenzaprine (FLEXERIL) tablet 10 mg (10 mg Oral Given 11/15/16 1632)  dexamethasone (DECADRON) injection 10 mg (10 mg Intravenous Given 11/15/16 1927)  ketorolac (TORADOL)  15 MG/ML injection 15 mg (15 mg Intravenous Given 11/15/16 1927)     Initial Impression / Assessment and Plan / ED Course  I have reviewed the triage vital signs and the nursing notes.  Pertinent labs & imaging results that were available during my care of the patient were reviewed by me and considered in my medical decision making (see chart for details).  46 year old female with history of hypertension, diabetes, stroke, who presents with atypical chest pain that began after coughing episode yesterday. I suspect her symptoms are actually secondary to cervical radiculopathy and she does have a known history of previous cervical surgery for disc herniation. I suspect this was exacerbated in the setting of asthma attack and coughing. Her pain is reproduced on palpation of paraspinal muscles and trapezius muscles and she does have some radicular symptoms but no weakness or numbness on my examination. She denies any trauma and has no midline tenderness and I do not feel bony pathology is likely or that imaging is indicated from this standpoint. Otherwise, she does have significant risk factors for coronary disease so delta troponin obtained and is negative. EKG is nonischemic. Her heart score is 3 and I do not suspect ACS at this time. She is hypertensive but this is baseline per her report and review of records and she is otherwise asymptomatic. Labs are otherwise reassuring. Will d/c with supportive care and outpatient follow-up.  This note was prepared with assistance of Systems analyst. Occasional wrong-word or sound-a-like substitutions may have occurred due to the inherent limitations of voice recognition software.   Final Clinical Impressions(s) / ED Diagnoses   Final diagnoses:  Atypical chest pain  Cervical radiculopathy    New Prescriptions Discharge Medication List as of 11/15/2016  7:56 PM    START taking these medications   Details  HYDROcodone-acetaminophen  (NORCO/VICODIN) 5-325 MG tablet Take 1-2 tablets by mouth every 4 (four) hours as needed for moderate pain or severe pain., Starting Wed 11/15/2016, Print    methylPREDNISolone (MEDROL DOSEPAK) 4 MG TBPK tablet Take as directed, Print         Duffy Bruce, MD 11/16/16 1128

## 2016-11-15 NOTE — ED Triage Notes (Signed)
Pt states, initially, R arm pain this am and several hours later began experiencing L sided chest pain that radiates to L arm and up L side of neck.

## 2016-11-15 NOTE — ED Notes (Signed)
Re-draw of pt's troponin lab done by Phillis Haggis, RN.

## 2016-11-15 NOTE — ED Notes (Signed)
Pt being brought back by xray.

## 2016-11-21 ENCOUNTER — Encounter: Payer: Self-pay | Admitting: Family Medicine

## 2016-11-21 ENCOUNTER — Ambulatory Visit (INDEPENDENT_AMBULATORY_CARE_PROVIDER_SITE_OTHER): Payer: Medicaid Other | Admitting: Family Medicine

## 2016-11-21 VITALS — BP 164/80 | HR 85 | Temp 98.3°F | Resp 16 | Ht 63.5 in | Wt 182.0 lb

## 2016-11-21 DIAGNOSIS — R0600 Dyspnea, unspecified: Secondary | ICD-10-CM

## 2016-11-21 DIAGNOSIS — N183 Chronic kidney disease, stage 3 unspecified: Secondary | ICD-10-CM

## 2016-11-21 DIAGNOSIS — E1101 Type 2 diabetes mellitus with hyperosmolarity with coma: Secondary | ICD-10-CM | POA: Diagnosis not present

## 2016-11-21 DIAGNOSIS — I10 Essential (primary) hypertension: Secondary | ICD-10-CM | POA: Diagnosis not present

## 2016-11-21 DIAGNOSIS — I5189 Other ill-defined heart diseases: Secondary | ICD-10-CM

## 2016-11-21 DIAGNOSIS — I519 Heart disease, unspecified: Secondary | ICD-10-CM | POA: Diagnosis not present

## 2016-11-21 DIAGNOSIS — R079 Chest pain, unspecified: Secondary | ICD-10-CM

## 2016-11-21 DIAGNOSIS — E1100 Type 2 diabetes mellitus with hyperosmolarity without nonketotic hyperglycemic-hyperosmolar coma (NKHHC): Secondary | ICD-10-CM

## 2016-11-21 LAB — POCT URINALYSIS DIP (DEVICE)
BILIRUBIN URINE: NEGATIVE
Glucose, UA: 500 mg/dL — AB
KETONES UR: NEGATIVE mg/dL
Leukocytes, UA: NEGATIVE
Nitrite: NEGATIVE
PH: 6.5 (ref 5.0–8.0)
PROTEIN: 100 mg/dL — AB
SPECIFIC GRAVITY, URINE: 1.02 (ref 1.005–1.030)
Urobilinogen, UA: 0.2 mg/dL (ref 0.0–1.0)

## 2016-11-21 LAB — BASIC METABOLIC PANEL
BUN: 24 mg/dL (ref 7–25)
CALCIUM: 9 mg/dL (ref 8.6–10.2)
CHLORIDE: 104 mmol/L (ref 98–110)
CO2: 20 mmol/L (ref 20–31)
CREATININE: 1.33 mg/dL — AB (ref 0.50–1.10)
GLUCOSE: 335 mg/dL — AB (ref 65–99)
Potassium: 4.9 mmol/L (ref 3.5–5.3)
SODIUM: 133 mmol/L — AB (ref 135–146)

## 2016-11-21 LAB — POCT GLYCOSYLATED HEMOGLOBIN (HGB A1C): Hemoglobin A1C: 11.4

## 2016-11-21 LAB — GLUCOSE, CAPILLARY: Glucose-Capillary: 347 mg/dL — ABNORMAL HIGH (ref 65–99)

## 2016-11-21 MED ORDER — NON FORMULARY
15.0000 [IU] | Freq: Once | Status: AC
  Administered 2016-11-21: 15 [IU] via INTRAMUSCULAR

## 2016-11-21 MED ORDER — INSULIN DETEMIR 100 UNIT/ML FLEXPEN: PEN_INJECTOR | SUBCUTANEOUS | 11 refills | Status: DC

## 2016-11-21 MED ORDER — CARVEDILOL 12.5 MG PO TABS: 12.5000 mg | ORAL_TABLET | Freq: Two times a day (BID) | ORAL | 5 refills | Status: DC

## 2016-11-21 MED ORDER — CLONIDINE HCL 0.1 MG PO TABS
0.1000 mg | ORAL_TABLET | Freq: Once | ORAL | Status: AC
Start: 2016-11-21 — End: 2016-11-21
  Administered 2016-11-21: 0.1 mg via ORAL

## 2016-11-21 MED ORDER — FUROSEMIDE 20 MG PO TABS: 20.0000 mg | ORAL_TABLET | Freq: Every day | ORAL | 3 refills | Status: DC

## 2016-11-21 NOTE — Patient Instructions (Addendum)
Diastolic dysfunction:  Will start a trial of Carvedilol 12.5 mg every 12 hours for diastolic dysfunction Will also start Furosemide 20 mg daily [place] schedule a repeat echocardiogram  Chronic kidney disease:   Will send a referral to nephrology for further workup and evaluation   Return on Friday for a blood pressure check   Diabetes mellitus:  Levemir 30 units in am and 20 units pm Recommend a lowfat, low carbohydrate diet divided over 5-6 small meals, increase water intake to 6-8 glasses

## 2016-11-21 NOTE — Progress Notes (Signed)
[name], a 46 year old female with a history of uncontrolled type 2 diabetes mellitus and uncontrolled hypertension presents for a post hospital follow up. Ms. [name] was evaluated in the emergency department for chest pains on 11/15/2016. She had not acute findings and was discharged home in stable condition. She has not been taking medications consistently for blood pressure. Christina Orozco's blood pressure was markedly elevated on arrival. She says that she has been under work stress and she often forgets to take medications as prescribed. She also does not check blood pressures at home. She has not been following a low sodium, carbohydrate modified diet. She endorses blurred vision in left eye. She is followed by opthalmology consistently for diabetic retinopathy. She denies polyuria, polydipsia, or polyphagia  She continues to have periodic chest pain and shortness of breath with exertion.  Symptoms began several weeks ago, and have been unchanged since that time.  Patient denies productive cough, tightness in chest and wheezing.    Past Medical History:  Diagnosis Date  . Asthma   . DDD (degenerative disc disease), cervical   . Diabetes mellitus   . Diabetes mellitus ([place])   . GERD (gastroesophageal reflux disease)   . Hypertension   . Hypertension   . Neuropathy   . Stroke [facility])    Social History   Social History  . Marital status: Single    Spouse name: N/A  . Number of children: 3  . Years of education: 12+   Occupational History  . [employer]     Social History Main Topics  . Smoking status: Never Smoker  . Smokeless tobacco: Never Used  . Alcohol use No  . Drug use: No  . Sexual activity: Not on file   Other Topics Concern  . Not on file   Social History Narrative   Lives with daughter   Caffeine use: daily   Review of Systems  Constitutional: Negative.  Negative for diaphoresis.  HENT: Negative.   Eyes: Negative.  Negative for blurred vision.   Respiratory: Positive for cough and shortness of breath.   Cardiovascular: Positive for palpitations.  Gastrointestinal: Negative.   Genitourinary: Negative.   Musculoskeletal: Positive for myalgias.  Skin: Negative.   Neurological: Positive for focal weakness.  Endo/Heme/Allergies: Negative.   Psychiatric/Behavioral: The patient is nervous/anxious.    Physical Exam  HENT:  Head: Normocephalic and atraumatic.  Right Ear: External ear normal.  Left Ear: External ear normal.  Nose: Nose normal.  Mouth/Throat: Oropharynx is clear and moist.  Cardiovascular: Normal rate.  An irregular rhythm present.  Pulmonary/Chest: Effort normal. She has no decreased breath sounds. She has no wheezes. She has no rhonchi. She has no rales.  Abdominal: Soft. Normal appearance and bowel sounds are normal.  Skin: Skin is warm and dry.  Psychiatric: Mood, memory, affect and judgment normal.   Plan   1. Dyspnea, unspecified type Reviewed previous echocardiogram EF 65-70 %. Will repeat echocardiogram.  - ECHOCARDIOGRAM COMPLETE; Future  2. Chest pain, unspecified type - ECHOCARDIOGRAM COMPLETE; Future - Thyroid Panel With TSH  3. Essential hypertension Blood pressure decreased to 164/80 after Clonidine 0.1 mg.  Will discontinue triamterene due to CKD. Will start a trial of furosemide for diastolic dysfunction (Grade 1).  - cloNIDine (CATAPRES) tablet 0.1 mg; Take 1 tablet (0.1 mg total) by mouth once. - furosemide (LASIX) 20 MG tablet; Take 1 tablet (20 mg total) by mouth daily.  Dispense: 30 tablet; Refill: 3 - Microalbumin/Creatinine Ratio, Urine -  Basic Metabolic Panel  4. Diastolic dysfunction - carvedilol (COREG) 12.5 MG tablet; Take 1 tablet (12.5 mg total) by mouth 2 (two) times daily with a meal.  Dispense: 60 tablet; Refill: 5 - furosemide (LASIX) 20 MG tablet; Take 1 tablet (20 mg total) by mouth daily.  Dispense: 30 tablet; Refill: 3 - Basic Metabolic Panel  5. Stage 3 chronic  kidney disease Will monitor BUN, creatinine, and GRF closely. May warrant a referral to nephrology.  - Microalbumin/Creatinine Ratio, Urine - Basic Metabolic Panel  6. Type 2 diabetes mellitus with hyperosmolar nonketotic hyperglycemia (HCC) There have been some  compliance issues here. I have discussed with her the great importance of following the treatment plan exactly as directed in order to achieve a good medical outcome. Hemoglobin a1C is 11.3. I will increase Levemir to 30 units in am and 20 units pm. Will continue Novolog for meal coverage.   - HgB A1c - Insulin Detemir (LEVEMIR FLEXTOUCH) 100 UNIT/ML Pen; 30 units in am and 20 units pm  Dispense: 15 mL; Refill: 11 - NON FORMULARY 15 Units; Inject 15 Units as directed once.   RTC: Patient will return to office on Friday 11/24/2016 for a blood pressure check. She was also instructed to bring glucometer. I will evaluate post prandial blood sugars at that time. May have to adjust Novolog after reviewing post prandial CBGs. She will also follow up in 2 weeks for medication management.    [name]  MSN, FNP-C [facility] [address] [place], [place] [address] [phone]

## 2016-11-22 LAB — MICROALBUMIN / CREATININE URINE RATIO
CREATININE, URINE: 54 mg/dL (ref 20–320)
Microalb Creat Ratio: 985 mcg/mg creat — ABNORMAL HIGH (ref <30)
Microalb, Ur: 53.2 mg/dL

## 2016-11-22 LAB — THYROID PANEL WITH TSH
Free Thyroxine Index: 2.8 (ref 1.4–3.8)
T3 Uptake: 32 % (ref 22–35)
T4, Total: 8.6 ug/dL (ref 4.5–12.0)
TSH: 0.5 m[IU]/L

## 2016-11-22 LAB — GLUCOSE, CAPILLARY: GLUCOSE-CAPILLARY: 345 mg/dL — AB (ref 65–99)

## 2016-11-24 ENCOUNTER — Other Ambulatory Visit: Payer: Self-pay | Admitting: Family Medicine

## 2016-11-24 ENCOUNTER — Ambulatory Visit: Payer: Medicaid Other

## 2016-11-24 ENCOUNTER — Other Ambulatory Visit: Payer: Self-pay

## 2016-11-24 VITALS — BP 176/98

## 2016-11-24 DIAGNOSIS — I10 Essential (primary) hypertension: Secondary | ICD-10-CM

## 2016-11-24 DIAGNOSIS — I5189 Other ill-defined heart diseases: Secondary | ICD-10-CM

## 2016-11-24 MED ORDER — PEN NEEDLES 32G X 5 MM MISC: 1.0000 | Freq: Every day | 3 refills | Status: DC

## 2016-11-24 MED ORDER — FUROSEMIDE 40 MG PO TABS: 40.0000 mg | ORAL_TABLET | Freq: Every day | ORAL | 5 refills | Status: DC

## 2016-12-01 ENCOUNTER — Other Ambulatory Visit: Payer: Self-pay | Admitting: Family Medicine

## 2016-12-01 ENCOUNTER — Ambulatory Visit (HOSPITAL_COMMUNITY)
Admission: RE | Admit: 2016-12-01 | Discharge: 2016-12-01 | Disposition: A | Discharge status: Home / Self Care | Payer: Medicaid Other | Source: Ambulatory Visit | Attending: Family Medicine | Admitting: Family Medicine

## 2016-12-01 ENCOUNTER — Ambulatory Visit: Payer: Medicaid Other

## 2016-12-01 VITALS — BP 160/94

## 2016-12-01 DIAGNOSIS — I5189 Other ill-defined heart diseases: Secondary | ICD-10-CM

## 2016-12-01 DIAGNOSIS — E119 Type 2 diabetes mellitus without complications: Secondary | ICD-10-CM | POA: Diagnosis not present

## 2016-12-01 DIAGNOSIS — K219 Gastro-esophageal reflux disease without esophagitis: Secondary | ICD-10-CM | POA: Diagnosis not present

## 2016-12-01 DIAGNOSIS — I10 Essential (primary) hypertension: Secondary | ICD-10-CM | POA: Insufficient documentation

## 2016-12-01 DIAGNOSIS — R079 Chest pain, unspecified: Secondary | ICD-10-CM | POA: Insufficient documentation

## 2016-12-01 DIAGNOSIS — Z23 Encounter for immunization: Secondary | ICD-10-CM

## 2016-12-01 DIAGNOSIS — Z8673 Personal history of transient ischemic attack (TIA), and cerebral infarction without residual deficits: Secondary | ICD-10-CM | POA: Insufficient documentation

## 2016-12-01 DIAGNOSIS — R0600 Dyspnea, unspecified: Secondary | ICD-10-CM | POA: Insufficient documentation

## 2016-12-01 MED ORDER — CARVEDILOL 12.5 MG PO TABS: ORAL_TABLET | ORAL | 5 refills | Status: DC

## 2016-12-01 MED ORDER — ZOSTER VAC RECOMB ADJUVANTED 50 MCG/0.5ML IM SUSR: 0.5000 mL | Freq: Once | INTRAMUSCULAR | 1 refills | Status: AC

## 2016-12-01 NOTE — Progress Notes (Signed)
  Echocardiogram 2D Echocardiogram has been performed.  Darlina Sicilian M 12/01/2016, 9:52 AM

## 2016-12-01 NOTE — Progress Notes (Signed)
   Ms. Kallstrom presents for a blood pressure follow-up of blood pressure. Blood pressure is above goal.  Will increase Coreg to the following dosage:   Meds ordered this encounter  Medications  . carvedilol (COREG) 12.5 MG tablet    Sig: Take 25 mg in am and 12.5 pm    Dispense:  60 tablet    Refill:  5  . [place] Vac Recomb Adjuvanted [facility]) injection    Sig: Inject 0.5 mLs into the muscle once.    Dispense:  0.5 mL    Refill:  1    Will follow up in 1 week for a blood pressure check.    The patient was given clear instructions to go to ER or return to medical center if symptoms do not improve, worsen or new problems develop. The patient verbalized understanding.    [name]  MSN, FNP-C [place] [address] [place], [place] [address] [phone]

## 2016-12-05 ENCOUNTER — Encounter: Payer: Self-pay | Admitting: Family Medicine

## 2016-12-05 ENCOUNTER — Other Ambulatory Visit: Payer: Self-pay | Admitting: Family Medicine

## 2016-12-05 ENCOUNTER — Encounter: Payer: Medicaid Other | Admitting: Family Medicine

## 2016-12-05 VITALS — BP 164/88 | HR 76 | Temp 98.2°F | Resp 16 | Ht 63.5 in | Wt 183.0 lb

## 2016-12-05 DIAGNOSIS — I5189 Other ill-defined heart diseases: Secondary | ICD-10-CM

## 2016-12-05 DIAGNOSIS — I10 Essential (primary) hypertension: Secondary | ICD-10-CM

## 2016-12-05 DIAGNOSIS — E785 Hyperlipidemia, unspecified: Secondary | ICD-10-CM

## 2016-12-05 LAB — GLUCOSE, CAPILLARY: GLUCOSE-CAPILLARY: 114 mg/dL — AB (ref 65–99)

## 2016-12-05 MED ORDER — CARVEDILOL 12.5 MG PO TABS: ORAL_TABLET | ORAL | 5 refills | Status: DC

## 2016-12-05 MED ORDER — ATORVASTATIN CALCIUM 40 MG PO TABS: 40.0000 mg | ORAL_TABLET | Freq: Every day | ORAL | 5 refills | Status: DC

## 2016-12-05 MED ORDER — FUROSEMIDE 40 MG PO TABS: 40.0000 mg | ORAL_TABLET | Freq: Every day | ORAL | 5 refills | Status: DC

## 2016-12-05 NOTE — Progress Notes (Signed)
Meds ordered this encounter  Medications  . furosemide (LASIX) 40 MG tablet    Sig: Take 1 tablet (40 mg total) by mouth daily.    Dispense:  30 tablet    Refill:  5  . carvedilol (COREG) 12.5 MG tablet    Sig: Take 25 mg in am and 12.5 pm    Dispense:  90 tablet    Refill:  5  . atorvastatin (LIPITOR) 40 MG tablet    Sig: Take 1 tablet (40 mg total) by mouth daily at 6 PM.    Dispense:  30 tablet    Refill:  [place]  MSN, FNP-C [place] [address] [place], [place] [address] [phone]

## 2016-12-06 NOTE — Progress Notes (Signed)
[name] was scheduled a 1 week follow up after adjusting antihypertensive medication for greater diabetes control. [name] misplaced paper copy of medications. I reschedule patient to follow up after starting medications. She expressed understanding.    [name]  MSN, FNP-C [place], [place] [address] [phone]   This encounter was created in error - please disregard.

## 2016-12-11 ENCOUNTER — Other Ambulatory Visit: Payer: Self-pay | Admitting: Family Medicine

## 2016-12-11 DIAGNOSIS — G4700 Insomnia, unspecified: Secondary | ICD-10-CM

## 2016-12-14 ENCOUNTER — Encounter: Payer: Self-pay | Admitting: Family Medicine

## 2016-12-14 ENCOUNTER — Ambulatory Visit (INDEPENDENT_AMBULATORY_CARE_PROVIDER_SITE_OTHER): Payer: Medicaid Other | Admitting: Family Medicine

## 2016-12-14 ENCOUNTER — Other Ambulatory Visit: Payer: Self-pay | Admitting: Family Medicine

## 2016-12-14 VITALS — BP 158/96 | HR 74 | Temp 98.2°F | Resp 16 | Ht 63.5 in | Wt 185.0 lb

## 2016-12-14 DIAGNOSIS — I10 Essential (primary) hypertension: Secondary | ICD-10-CM

## 2016-12-14 DIAGNOSIS — I499 Cardiac arrhythmia, unspecified: Secondary | ICD-10-CM

## 2016-12-14 DIAGNOSIS — I519 Heart disease, unspecified: Secondary | ICD-10-CM

## 2016-12-14 DIAGNOSIS — I5189 Other ill-defined heart diseases: Secondary | ICD-10-CM

## 2016-12-14 MED ORDER — CLONIDINE HCL 0.1 MG PO TABS
0.2000 mg | ORAL_TABLET | Freq: Once | ORAL | Status: AC
  Administered 2016-12-14: 0.2 mg via ORAL

## 2016-12-14 MED ORDER — CARVEDILOL 12.5 MG PO TABS: ORAL_TABLET | ORAL | 5 refills | Status: DC

## 2016-12-14 MED ORDER — CLONIDINE HCL 0.1 MG PO TABS: 0.1000 mg | ORAL_TABLET | Freq: Every day | ORAL | 0 refills | Status: DC

## 2016-12-14 NOTE — Patient Instructions (Addendum)
Accelerated hypertension:  Will start a trial of Clonidine 0.1 mg daily.  Continue Carvedilol 25 mg twice daily and Furosemide 20 mg daily. Will return to office on Monday, 12/18/2016 for blood pressure check. Will follow up for hypertension in 1 week.   Heart Failure Heart failure means your heart has trouble pumping blood. This makes it hard for your body to work well. Heart failure is usually a long-term (chronic) condition. You must take good care of yourself and follow your doctor's treatment plan. Follow these instructions at home:  Take your heart medicine as told by your doctor. ? Do not stop taking medicine unless your doctor tells you to. ? Do not skip any dose of medicine. ? Refill your medicines before they run out. ? Take other medicines only as told by your doctor or pharmacist.  Stay active if told by your doctor. The elderly and people with severe heart failure should talk with a doctor about physical activity.  Eat heart-healthy foods. Choose foods that are without trans fat and are low in saturated fat, cholesterol, and salt (sodium). This includes fresh or frozen fruits and vegetables, fish, lean meats, fat-free or low-fat dairy foods, whole grains, and high-fiber foods. Lentils and dried peas and beans (legumes) are also good choices.  Limit salt if told by your doctor.  Cook in a healthy way. Roast, grill, broil, bake, poach, steam, or stir-fry foods.  Limit fluids as told by your doctor.  Weigh yourself every morning. Do this after you pee (urinate) and before you eat breakfast. Write down your weight to give to your doctor.  Take your blood pressure and write it down if your doctor tells you to.  Ask your doctor how to check your pulse. Check your pulse as told.  Lose weight if told by your doctor.  Stop smoking or chewing tobacco. Do not use gum or patches that help you quit without your doctor's approval.  Schedule and go to doctor visits as  told.  Nonpregnant women should have no more than 1 drink a day. Men should have no more than 2 drinks a day. Talk to your doctor about drinking alcohol.  Stop illegal drug use.  Stay current with shots (immunizations).  Manage your health conditions as told by your doctor.  Learn to manage your stress.  Rest when you are tired.  If it is really hot outside: ? Avoid intense activities. ? Use air conditioning or fans, or get in a cooler place. ? Avoid caffeine and alcohol. ? Wear loose-fitting, lightweight, and light-colored clothing.  If it is really cold outside: ? Avoid intense activities. ? Layer your clothing. ? Wear mittens or gloves, a hat, and a scarf when going outside. ? Avoid alcohol.  Learn about heart failure and get support as needed.  Get help to maintain or improve your quality of life and your ability to care for yourself as needed. Contact a doctor if:  You gain weight quickly.  You are more short of breath than usual.  You cannot do your normal activities.  You tire easily.  You cough more than normal, especially with activity.  You have any or more puffiness (swelling) in areas such as your hands, feet, ankles, or belly (abdomen).  You cannot sleep because it is hard to breathe.  You feel like your heart is beating fast (palpitations).  You get dizzy or light-headed when you stand up. Get help right away if:  You have trouble breathing.  There is  a change in mental status, such as becoming less alert or not being able to focus.  You have chest pain or discomfort.  You faint. This information is not intended to replace advice given to you by your health care provider. Make sure you discuss any questions you have with your health care provider. Document Released: 03/07/2008 Document Revised: 11/04/2015 Document Reviewed: 07/15/2012 Elsevier Interactive Patient Education  2017 [employer].

## 2016-12-14 NOTE — Progress Notes (Signed)
[name], a 46 year old female with a history of uncontrolled type 2 diabetes mellitus and uncontrolled hypertension presents for follow up of hypertension. [name] has been in frequently over the past month after changing antihypertensive medications to gain greater control. She was also complaining of periodic heart palpitations, dyspnea and lower extremity edema. She was evaluated in the emergency department on 11/15/2016 and there were no acute findings. She had an echocardiogram in 2017 that showed mild diastolic dysfunction. I repeated echocardiogram to rule out worsening diastolic dysfunction. There was wall thickness in a pattern of sever LVH. Her estimated ejetion fraction was in the range of 65 to 70%. She also has grade 1 diastolic dysfunction.    Christina Orozco's blood pressure was markedly elevated on arrival. She continues to have work and family stress and often forgets to take medications as prescribed. She also does not check blood pressures at home. She has a blood pressure monitor at home and was instructed to maintain and daily and check weights.  She has not been following a low sodium, carbohydrate modified diet. She endorses blurred vision in left eye. She is followed by opthalmology consistently for diabetic retinopathy. She denies polyuria, polydipsia, or polyphagia  She continues to have periodic chest pain and shortness of breath with exertion.  Symptoms began several weeks ago, and have been unchanged since that time.  Patient denies productive cough, tightness in chest and wheezing.    Past Medical History:  Diagnosis Date  . Asthma   . DDD (degenerative disc disease), cervical   . Diabetes mellitus   . Diabetes mellitus ([place])   . GERD (gastroesophageal reflux disease)   . Hypertension   . Hypertension   . Neuropathy   . Stroke [facility])    Social History   Social History  . Marital status: Single    Spouse name: N/A  . Number of children: 3  . Years of  education: 12+   Occupational History  . [employer]     Social History Main Topics  . Smoking status: Never Smoker  . Smokeless tobacco: Never Used  . Alcohol use No  . Drug use: No  . Sexual activity: Not on file   Other Topics Concern  . Not on file   Social History Narrative   Lives with daughter   Caffeine use: daily   Review of Systems  Constitutional: Negative.  Negative for diaphoresis.  HENT: Negative.   Eyes: Positive for blurred vision.  Respiratory: Positive for shortness of breath (occasional with exertion).   Cardiovascular: Positive for palpitations.  Gastrointestinal: Negative.   Genitourinary: Negative.   Musculoskeletal: Positive for myalgias.  Skin: Negative.   Endo/Heme/Allergies: Negative.   Psychiatric/Behavioral: The patient is nervous/anxious.    Physical Exam  HENT:  Head: Normocephalic and atraumatic.  Right Ear: External ear normal.  Left Ear: External ear normal.  Nose: Nose normal.  Mouth/Throat: Oropharynx is clear and moist.  Cardiovascular: Normal rate.  An irregular rhythm present.  Pulses:      Carotid pulses are 2+ on the right side, and 2+ on the left side.      Radial pulses are 2+ on the right side, and 2+ on the left side.       Femoral pulses are 2+ on the right side, and 2+ on the left side.      Popliteal pulses are 2+ on the right side, and 2+ on the left side.       Dorsalis  pedis pulses are 2+ on the right side, and 2+ on the left side.       Posterior tibial pulses are 2+ on the right side, and 2+ on the left side.  Trace edema to lower extremities bilaterally  Pulmonary/Chest: Effort normal. She has no decreased breath sounds. She has no wheezes. She has no rhonchi. She has no rales.  Abdominal: Soft. Normal appearance and bowel sounds are normal.  Skin: Skin is warm and dry.  Psychiatric: Mood, memory, affect and judgment normal.  BP (!) 186/98 (BP Location: Left Arm, Patient Position: Sitting, Cuff Size:  Normal) Comment: manuall  Pulse 74   Temp 98.2 F (36.8 C) (Oral)   Resp 16   Ht 5' 3.5" (1.613 m)   Wt 185 lb (83.9 kg)   SpO2 100%   BMI 32.26 kg/m   Plan   1. Accelerated hypertension [name] has continuous uncontrolled hypertension that has not responded to medication regimen. Blood pressure was markedly elevated on arrival. Administered Clonidine 0.1, blood pressure decreased from 186/98 to 158/96 within 30 minutes.  Will increase Carvedilol to 25 mg twice daily as discussed.  Will add Clonidine 0.1 mg daily.  [name] to return on 12/18/2016 for a blood pressure check.  I will defer to cardiology for further workup and evaluation of this problem.  - carvedilol (COREG) 12.5 MG tablet; Take 25 mg twice daily  Dispense: 90 tablet; Refill: 5 - cloNIDine (CATAPRES) tablet 0.2 mg; Take 2 tablets (0.2 mg total) by mouth once. - EKG 12-Lead - Ambulatory referral to Cardiology  2. Diastolic dysfunction Reviewed echocardiogram from 12/01/2016. Study conclusions are as follows:  Left ventricle: The cavity size was normal. Wall thickness was   increased in a pattern of severe LVH. There was focal basal   hypertrophy. Systolic function was vigorous. The estimated   ejection fraction was in the range of 65% to 70%. Wall motion was   normal; there were no regional wall motion abnormalities. Doppler   parameters are consistent with abnormal left ventricular   relaxation (grade 1 diastolic dysfunction).  - carvedilol (COREG) 12.5 MG tablet; Take 25 mg twice daily  Dispense: 90 tablet; Refill: 5 - cloNIDine (CATAPRES) tablet 0.2 mg; Take 2 tablets (0.2 mg total) by mouth once. - EKG 12-Lead - Ambulatory referral to Cardiology  3. Irregular heartbeat Reviewed EKG, abnormal. Will send referral to cardiology.  - EKG 12-Lead - Ambulatory referral to Cardiology   RTC: Return to clinic for blood pressure check on 12/18/2016  Greater than 50% of visit was spent reviewing labs and images  and counseling patient.   [name]  MSN, FNP-C [place] [address] [place], [place] [address] [phone]

## 2016-12-18 ENCOUNTER — Ambulatory Visit: Payer: Medicaid Other

## 2016-12-18 VITALS — BP 166/90

## 2016-12-18 DIAGNOSIS — I10 Essential (primary) hypertension: Secondary | ICD-10-CM

## 2016-12-18 NOTE — Progress Notes (Signed)
1609

## 2016-12-29 ENCOUNTER — Encounter: Payer: Self-pay | Admitting: Family Medicine

## 2016-12-29 ENCOUNTER — Ambulatory Visit (INDEPENDENT_AMBULATORY_CARE_PROVIDER_SITE_OTHER): Payer: Medicaid Other | Admitting: Family Medicine

## 2016-12-29 ENCOUNTER — Other Ambulatory Visit: Payer: Self-pay | Admitting: Family Medicine

## 2016-12-29 VITALS — BP 156/82 | HR 82 | Temp 98.3°F | Resp 16 | Ht 63.5 in | Wt 187.0 lb

## 2016-12-29 DIAGNOSIS — I10 Essential (primary) hypertension: Secondary | ICD-10-CM

## 2016-12-29 DIAGNOSIS — E119 Type 2 diabetes mellitus without complications: Secondary | ICD-10-CM

## 2016-12-29 LAB — POCT URINALYSIS DIP (DEVICE)
Bilirubin Urine: NEGATIVE
GLUCOSE, UA: NEGATIVE mg/dL
Ketones, ur: NEGATIVE mg/dL
NITRITE: NEGATIVE
PROTEIN: 100 mg/dL — AB
SPECIFIC GRAVITY, URINE: 1.015 (ref 1.005–1.030)
UROBILINOGEN UA: 0.2 mg/dL (ref 0.0–1.0)
pH: 5 (ref 5.0–8.0)

## 2016-12-29 MED ORDER — GLUCOSE BLOOD VI STRP: ORAL_STRIP | 12 refills | Status: DC

## 2016-12-29 MED ORDER — CLONIDINE HCL 0.1 MG PO TABS: 0.1000 mg | ORAL_TABLET | Freq: Every day | ORAL | 1 refills | Status: DC

## 2016-12-29 MED ORDER — INSULIN ASPART 100 UNIT/ML FLEXPEN: 10.0000 [IU] | PEN_INJECTOR | Freq: Three times a day (TID) | SUBCUTANEOUS | 11 refills | Status: DC

## 2016-12-29 NOTE — Progress Notes (Signed)
Christina Orozco, a 46 year old female with a history of uncontrolled type 2 diabetes mellitus and uncontrolled hypertension presents for follow up of hypertension. [name] has been in frequently over the past month after changing antihypertensive medications to gain greater control.  She had an echocardiogram in 2017 that showed mild diastolic dysfunction. I repeated echocardiogram to rule out worsening diastolic dysfunction. There was wall thickness in a pattern of sever LVH. Her estimated ejetion fraction was in the range of 65 to 70%. She also has grade 1 diastolic dysfunction.  A referral has been sent to cardiology. Christina Orozco has an appointment scheduled on 01/08/2017.   Patient has been taking medications consistently. She also does not check blood pressures at home. She has a blood pressure monitor at home and was instructed to maintain and daily and check weights.  She has not been following a low sodium, carbohydrate modified diet. She does not exercise routinely. She denies chest pain, fatigue, lower extremity edema or syncope.  Past Medical History:  Diagnosis Date  . Asthma   . DDD (degenerative disc disease), cervical   . Diabetes mellitus   . Diabetes mellitus ([place])   . GERD (gastroesophageal reflux disease)   . Hypertension   . Hypertension   . Neuropathy   . Stroke [facility])    Social History   Social History  . Marital status: Single    Spouse name: N/A  . Number of children: 3  . Years of education: 12+   Occupational History  . [employer]     Social History Main Topics  . Smoking status: Never Smoker  . Smokeless tobacco: Never Used  . Alcohol use No  . Drug use: No  . Sexual activity: Not on file   Other Topics Concern  . Not on file   Social History Narrative   Lives with daughter   Caffeine use: daily   Review of Systems  Constitutional: Negative.  Negative for diaphoresis.  HENT: Negative.   Respiratory: Positive for shortness of breath  (occasional with exertion).   Cardiovascular: Negative for chest pain and palpitations.  Gastrointestinal: Negative.   Genitourinary: Negative.   Skin: Negative.   Neurological: Negative for dizziness and headaches.  Endo/Heme/Allergies: Negative.   Psychiatric/Behavioral: The patient is nervous/anxious.        Patient is anxious and is discussing finances and losing insurance benefits   Physical Exam  HENT:  Head: Normocephalic and atraumatic.  Right Ear: External ear normal.  Left Ear: External ear normal.  Nose: Nose normal.  Mouth/Throat: Oropharynx is clear and moist.  Cardiovascular: Normal rate.  An irregular rhythm present.  Pulses:      Carotid pulses are 2+ on the right side, and 2+ on the left side.      Radial pulses are 2+ on the right side, and 2+ on the left side.       Femoral pulses are 2+ on the right side, and 2+ on the left side.      Popliteal pulses are 2+ on the right side, and 2+ on the left side.       Dorsalis pedis pulses are 2+ on the right side, and 2+ on the left side.       Posterior tibial pulses are 2+ on the right side, and 2+ on the left side.  Pulmonary/Chest: Effort normal. She has no decreased breath sounds. She has no wheezes. She has no rhonchi. She has no rales.  Abdominal: Soft. Normal  appearance and bowel sounds are normal.  Skin: Skin is warm and dry.  Psychiatric: Mood, memory, affect and judgment normal.  BP (!) 156/82 Comment: manually  Pulse 82   Temp 98.3 F (36.8 C) (Oral)   Resp 16   Ht 5' 3.5" (1.613 m)   Wt 187 lb (84.8 kg)   LMP 12/11/2016   SpO2 100%   BMI 32.61 kg/m   Plan   1. Essential hypertension [name] has continuous uncontrolled hypertension. Blood pressure has improved since starting clonidine. I will defer to cardiology for further workup and evaluation of this problem.  - cloNIDine (CATAPRES) 0.1 MG tablet; Take 1 tablet (0.1 mg total) by mouth daily.  Dispense: 90 tablet; Refill: 1  2. Type 2 diabetes  mellitus without complication, without long-term current use of insulin (HCC) Previous hemoglobin a1C 11.4, will review a1C when results become available. She says that she typically misses meal coverage at lunch. She does not take insulin to work with her. Discussed the importance of medication adherence. She is not always compliant with Levemir. She says that she has been under a great deal of financial stress and has not been able to keep up with medication regimen. May warrant insulin adjustment after reviewing hemoglobin a1C.  - insulin aspart (NOVOLOG) 100 UNIT/ML FlexPen; Inject 10 Units into the skin 3 (three) times daily with meals.  Dispense: 15 mL; Refill: 11 - Hemoglobin A1c   Greater than 50 % of visit spent counseling on diabetes diet and medication adherence  RTC: 1 month for medication management and diabetes mellitus   Christina Pounds  MSN, FNP-C [facility] Patient [facility] [place], [place] [address] [phone]

## 2016-12-30 LAB — HEMOGLOBIN A1C
HEMOGLOBIN A1C: 11.3 % — AB (ref <5.7)
Mean Plasma Glucose: 278 mg/dL

## 2017-01-08 NOTE — Progress Notes (Signed)
Cardiology Office Note   Date:  01/09/2017   ID:  Christina Orozco, DOB 01/29/1971, MRN [id number]  PCP:  Christina Dew, FNP  Cardiologist:   Skeet Latch, MD   Chief Complaint  Patient presents with  . Follow-up    sob/doe     History of Present Illness: Christina Orozco is a 46 y.o. female with hypertension, diabetes, prior stroke, and asthma who presents for an evaluation of hypertension and LVH.  Christina Orozco has been working with Christina Sickle, FNP for blood pressure management.  She had an echo 04/2016 that revealed LVEF 65-70% and mild basal septum hypertrophy.  She had a repeat echo 12/01/16 that showed LVEF 65-70% and severe LVH.  Overall Christina Orozco has been feeling well.  Last week she had an episode of chest pressure that felt like there was an a bubble in her chest.  She felt terrible and her glucose was 68.  She did not check her blood pressure.  She has a wrist cuff.  When she measures her blood pressure at home it Is often over 200.  She reports that she was diagnosed with hypertension in high school.  The only itme it was well-controlled was when she was pregnant. She has several family members with hypertension.  Her mother had a heart attack at age 19 and several other family members had premature CAD.   Christina Orozco report lower extremity edema that improves with elevation.  She denies orthopenea or PND.  She doesn't get much formal exercise. She reports a lot of stress both at home and at work.  Past Medical History:  Diagnosis Date  . Asthma   . DDD (degenerative disc disease), cervical   . Diabetes mellitus   . Diabetes mellitus ([place])   . GERD (gastroesophageal reflux disease)   . Hypertension   . Hypertension   . Neuropathy   . Stroke [facility])     Past Surgical History:  Procedure Laterality Date  . ADENOIDECTOMY    . APPENDECTOMY    . CERVICAL SPINE SURGERY  06/2012   C5-C6  . EYE SURGERY     left eye surgery   . SHOULDER SURGERY    .  TONSILLECTOMY       Current Outpatient Prescriptions  Medication Sig Dispense Refill  . ACCU-CHEK AVIVA PLUS test strip CHECK BLOOD SUGAR PRIOR TO MEALS AND AT BEDTIME. USE AS INSTRUCTED. 300 each 12  . albuterol (PROVENTIL HFA;VENTOLIN HFA) 108 (90 Base) MCG/ACT inhaler Inhale 1-2 puffs into the lungs every 6 (six) hours as needed for wheezing or shortness of breath. 1 Inhaler 1  . Aspirin (ASPIR-81 PO) Take 81 mg by mouth daily.     Marland Kitchen atorvastatin (LIPITOR) 40 MG tablet Take 1 tablet (40 mg total) by mouth daily at 6 PM. 30 tablet 5  . carvedilol (COREG) 25 MG tablet Take 1 tablet (25 mg total) by mouth 2 (two) times daily with a meal. Take 25 mg twice daily 180 tablet 3  . cloNIDine (CATAPRES) 0.1 MG tablet Take 1 tablet (0.1 mg total) by mouth daily. 90 tablet 1  . ferrous sulfate 325 (65 FE) MG tablet Take 1 tablet (325 mg total) by mouth daily with breakfast. 30 tablet 3  . furosemide (LASIX) 40 MG tablet Take 1 tablet (40 mg total) by mouth daily. 30 tablet 5  . insulin aspart (NOVOLOG) 100 UNIT/ML FlexPen Inject 10 Units into the skin 3 (three) times daily with meals. 15 mL 11  .  Insulin Detemir (LEVEMIR FLEXTOUCH) 100 UNIT/ML Pen 30 units in am and 20 units pm 15 mL 11  . Insulin Pen Needle (PEN NEEDLES) 32G X 5 MM MISC 1 each by Does not apply route 5 (five) times daily. 200 each 3  . Olopatadine HCl (PATADAY) 0.2 % SOLN Apply 1 drop to eye daily. 1 Bottle 0  . omeprazole (PRILOSEC) 40 MG capsule Take 1 capsule (40 mg total) by mouth daily. Reported on 06/08/2015 30 capsule 5  . traZODone (DESYREL) 50 MG tablet TAKE 1/2 TABLET(25 MG) BY MOUTH AT BEDTIME AS NEEDED FOR SLEEP 30 tablet 0  . amLODipine (NORVASC) 5 MG tablet Take 1 tablet (5 mg total) by mouth daily. 90 tablet 3   No current facility-administered medications for this visit.     Allergies:   Lisinopril; Penicillins; and Gabapentin    Social History:  The patient  reports that she has never smoked. She has never used  smokeless tobacco. She reports that she does not drink alcohol or use drugs.   Family History:  The patient's family history includes Breast cancer (age of onset: 33) in her sister; CAD in her mother; Cancer in her unknown relative; Diabetes in her unknown relative; Heart disease in her unknown relative; Heart failure in her mother; Seizures in her unknown relative; Vascular Disease in her mother.    ROS:  Please see the history of present illness.   Otherwise, review of systems are positive for none.   All other systems are reviewed and negative.    PHYSICAL EXAM: VS:  BP (!) 192/100   Pulse 78   Ht 5' 2.5" (1.588 m)   Wt 83.9 kg (185 lb)   LMP 12/11/2016   SpO2 98%   BMI 33.30 kg/m  , BMI Body mass index is 33.3 kg/m. GENERAL:  Well appearing HEENT:  Pupils equal round and reactive, fundi not visualized, oral mucosa unremarkable NECK:  No jugular venous distention, waveform within normal limits, carotid upstroke brisk and symmetric, no bruits, no thyromegaly LYMPHATICS:  No cervical adenopathy LUNGS:  Clear to auscultation bilaterally HEART:  RRR.  PMI not displaced or sustained,S1 and S2 within normal limits, no S3, no S4, no clicks, no rubs,  no murmurs ABD:  Flat, positive bowel sounds normal in frequency in pitch, no bruits, no rebound, no guarding, no midline pulsatile mass, no hepatomegaly, no splenomegaly EXT:  2 plus pulses throughout, no edema, no cyanosis no clubbing SKIN:  No rashes no nodules NEURO:  Cranial nerves II through XII grossly intact, motor grossly intact throughout PSYCH:  Cognitively intact, oriented to person place and time    EKG:  EKG is not ordered today. The ekg ordered 12/14/16 demonstrates sinus rhythm.  Rate 75 bpm.  Cannot rule out prior septal infarct.    Recent Labs: 10/06/2016: ALT 8 11/15/2016: Hemoglobin 11.3; Platelets 271 11/21/2016: BUN 24; Creat 1.33; Potassium 4.9; Sodium 133; TSH 0.50    Lipid Panel    Component Value Date/Time    CHOL 143 05/02/2016 0605   TRIG 179 (H) 05/02/2016 0605   HDL 47 05/02/2016 0605   CHOLHDL 3.0 05/02/2016 0605   VLDL 36 05/02/2016 0605   LDLCALC 60 05/02/2016 0605      Wt Readings from Last 3 Encounters:  01/09/17 83.9 kg (185 lb)  12/29/16 84.8 kg (187 lb)  12/14/16 83.9 kg (185 lb)      ASSESSMENT AND PLAN:  # Hypertensive heart disease:  Ms. Pines blood pressure is very  poorly-controlled.  We will increase carvedilol to 25mg  bid.  Add amlodipine 5 mg daily.  She took this in the past and developed lower extremity edema on higher doses.  Continue clonidine.  She has severe hypertension that is attributable to poorly-controlled hypertension.  On review of her echo she had severe LVH in 2017 as well.  There is no evidence of SAM or outflow track obstruction.  # Chest pressure:  Symptoms are atypical and only occurred once.  This is not consistent with ischemia.    # CV Disease prevention: LDL 60 04/2016. Continue atorvastatin and aspirin.    On review she also had severe LVH on her previous echo.  There was no significant LVOT obstuctruction or SAM.   Current medicines are reviewed at length with the patient today.  The patient does not have concerns regarding medicines.  The following changes have been made:    Labs/ tests ordered today include:  No orders of the defined types were placed in this encounter.    Disposition:   FU with Angla Delahunt C. Oval Linsey, MD, [facility] in 2 weeks.     This note was written with the assistance of speech recognition software.  Please excuse any transcriptional errors.  Signed, Claryssa Sandner C. Oval Linsey, MD, [facility]  01/09/2017 9:57 PM    [place]

## 2017-01-09 ENCOUNTER — Encounter: Payer: Self-pay | Admitting: Cardiovascular Disease

## 2017-01-09 ENCOUNTER — Encounter: Payer: Self-pay | Admitting: *Deleted

## 2017-01-09 ENCOUNTER — Other Ambulatory Visit: Payer: Self-pay | Admitting: Cardiovascular Disease

## 2017-01-09 ENCOUNTER — Ambulatory Visit (INDEPENDENT_AMBULATORY_CARE_PROVIDER_SITE_OTHER): Payer: Medicaid Other | Admitting: Cardiovascular Disease

## 2017-01-09 VITALS — BP 192/100 | HR 78 | Ht 62.5 in | Wt 185.0 lb

## 2017-01-09 DIAGNOSIS — I119 Hypertensive heart disease without heart failure: Secondary | ICD-10-CM | POA: Diagnosis not present

## 2017-01-09 DIAGNOSIS — I519 Heart disease, unspecified: Secondary | ICD-10-CM

## 2017-01-09 DIAGNOSIS — I5189 Other ill-defined heart diseases: Secondary | ICD-10-CM

## 2017-01-09 DIAGNOSIS — I10 Essential (primary) hypertension: Secondary | ICD-10-CM | POA: Diagnosis not present

## 2017-01-09 DIAGNOSIS — E7800 Pure hypercholesterolemia, unspecified: Secondary | ICD-10-CM

## 2017-01-09 MED ORDER — AMLODIPINE BESYLATE 5 MG PO TABS: 5.0000 mg | ORAL_TABLET | Freq: Every day | ORAL | 3 refills | Status: DC

## 2017-01-09 MED ORDER — CARVEDILOL 25 MG PO TABS: 25.0000 mg | ORAL_TABLET | Freq: Two times a day (BID) | ORAL | 3 refills | Status: DC

## 2017-01-09 NOTE — Patient Instructions (Signed)
Medication Instructions:  INCREASE YOUR CARVEDILOL TO 25 MG TWICE A DAY   START AMLODIPINE 5 MG DAILY   Labwork: NONE  Testing/Procedures: NONE  Follow-Up: Your physician recommends that you schedule a follow-up appointment in: 2 WEEKS   If you need a refill on your cardiac medications before your next appointment, please call your pharmacy.

## 2017-01-09 NOTE — Telephone Encounter (Signed)
Refill Request.  

## 2017-01-24 ENCOUNTER — Encounter: Payer: Self-pay | Admitting: *Deleted

## 2017-01-24 ENCOUNTER — Encounter: Payer: Self-pay | Admitting: Cardiovascular Disease

## 2017-01-24 ENCOUNTER — Ambulatory Visit (INDEPENDENT_AMBULATORY_CARE_PROVIDER_SITE_OTHER): Payer: BLUE CROSS/BLUE SHIELD | Admitting: Cardiovascular Disease

## 2017-01-24 VITALS — BP 153/92 | HR 78 | Ht 62.5 in | Wt 193.6 lb

## 2017-01-24 DIAGNOSIS — Z79899 Other long term (current) drug therapy: Secondary | ICD-10-CM | POA: Diagnosis not present

## 2017-01-24 DIAGNOSIS — I119 Hypertensive heart disease without heart failure: Secondary | ICD-10-CM | POA: Diagnosis not present

## 2017-01-24 MED ORDER — HYDROCHLOROTHIAZIDE 25 MG PO TABS: 25.0000 mg | ORAL_TABLET | Freq: Every day | ORAL | 5 refills | Status: DC

## 2017-01-24 MED ORDER — SPIRONOLACTONE 25 MG PO TABS: 25.0000 mg | ORAL_TABLET | Freq: Every day | ORAL | 5 refills | Status: DC

## 2017-01-24 NOTE — Progress Notes (Signed)
Cardiology Office Note   Date:  01/24/2017   ID:  Christina Orozco, DOB [birth year], MRN [id number]  PCP:  Dorena Dew, FNP  Cardiologist:   Skeet Latch, MD   No chief complaint on file.    History of Present Illness: Christina Orozco is a 46 y.o. female with poorly-controlled hypertension, severe LVH, diabetes, prior stroke, and asthma who presents for follow-up. She was initially seen 12/2016 for an evaluation of hypertension and LVH.  She had an echo 04/2016 that revealed LVEF 65-70% and mild basal septum hypertrophy.  She had a repeat echo 12/01/16 that showed LVEF 65-70% and severe LVH.  She reports that she was initially diagnosed with hypertension in high school.  The only itme it was well-controlled was when she was pregnant. She has several family members with hypertension.  Her mother had a heart attack at age 56 and several other family members had premature CAD.  At her last appointment Christina Orozco's blood pressure was poorly-controlled. Carvedilol was increased to 25 mg twice daily and amlodipine was started.  She continues to have some lower extremity edema that is stable from prior. It typically improves with elevation of her legs and is variable whether or not it improves with compression stockings. She denies chest pain or shortness of breath. She checks her blood pressure at home and typically has been in the 433I to 951O systolic.  She reports fatigue and dry mouth that she attributes to clonidine.     Past Medical History:  Diagnosis Date  . Asthma   . DDD (degenerative disc disease), cervical   . Diabetes mellitus   . Diabetes mellitus ([place])   . GERD (gastroesophageal reflux disease)   . Hypertension   . Hypertension   . Neuropathy   . Stroke [facility])     Past Surgical History:  Procedure Laterality Date  . ADENOIDECTOMY    . APPENDECTOMY    . CERVICAL SPINE SURGERY  06/2012   C5-C6  . EYE SURGERY     left eye surgery   . SHOULDER SURGERY    .  TONSILLECTOMY       Current Outpatient Prescriptions  Medication Sig Dispense Refill  . ACCU-CHEK AVIVA PLUS test strip CHECK BLOOD SUGAR PRIOR TO MEALS AND AT BEDTIME. USE AS INSTRUCTED. 300 each 12  . albuterol (PROVENTIL HFA;VENTOLIN HFA) 108 (90 Base) MCG/ACT inhaler Inhale 1-2 puffs into the lungs every 6 (six) hours as needed for wheezing or shortness of breath. 1 Inhaler 1  . amLODipine (NORVASC) 5 MG tablet Take 1 tablet (5 mg total) by mouth daily. 90 tablet 3  . Aspirin (ASPIR-81 PO) Take 81 mg by mouth daily.     Marland Kitchen atorvastatin (LIPITOR) 40 MG tablet Take 1 tablet (40 mg total) by mouth daily at 6 PM. 30 tablet 5  . carvedilol (COREG) 25 MG tablet Take 1 tablet (25 mg total) by mouth 2 (two) times daily with a meal. Take 25 mg twice daily 180 tablet 3  . ferrous sulfate 325 (65 FE) MG tablet Take 1 tablet (325 mg total) by mouth daily with breakfast. 30 tablet 3  . insulin aspart (NOVOLOG) 100 UNIT/ML FlexPen Inject 10 Units into the skin 3 (three) times daily with meals. 15 mL 11  . Insulin Detemir (LEVEMIR FLEXTOUCH) 100 UNIT/ML Pen 30 units in am and 20 units pm 15 mL 11  . Insulin Pen Needle (PEN NEEDLES) 32G X 5 MM MISC 1 each by Does not apply route  5 (five) times daily. 200 each 3  . Olopatadine HCl (PATADAY) 0.2 % SOLN Apply 1 drop to eye daily. 1 Bottle 0  . omeprazole (PRILOSEC) 40 MG capsule Take 1 capsule (40 mg total) by mouth daily. Reported on 06/08/2015 30 capsule 5  . traZODone (DESYREL) 50 MG tablet TAKE 1/2 TABLET(25 MG) BY MOUTH AT BEDTIME AS NEEDED FOR SLEEP 30 tablet 0  . hydrochlorothiazide (HYDRODIURIL) 25 MG tablet Take 1 tablet (25 mg total) by mouth daily. 30 tablet 5  . spironolactone (ALDACTONE) 25 MG tablet Take 1 tablet (25 mg total) by mouth daily. 30 tablet 5   No current facility-administered medications for this visit.     Allergies:   Lisinopril; Penicillins; and Gabapentin    Social History:  The patient  reports that she has never smoked.  She has never used smokeless tobacco. She reports that she does not drink alcohol or use drugs.   Family History:  The patient's family history includes Breast cancer (age of onset: 73) in her sister; CAD in her mother; Cancer in her unknown relative; Diabetes in her unknown relative; Heart disease in her unknown relative; Heart failure in her mother; Seizures in her unknown relative; Vascular Disease in her mother.    ROS:  Please see the history of present illness.   Otherwise, review of systems are positive for none.   All other systems are reviewed and negative.    PHYSICAL EXAM: VS:  BP (!) 153/92   Pulse 78   Ht 5' 2.5" (1.588 m)   Wt 87.8 kg (193 lb 9.6 oz)   BMI 34.85 kg/m  , BMI Body mass index is 34.85 kg/m. GENERAL:  Well appearing HEENT: Pupils equal round and reactive, fundi not visualized, oral mucosa unremarkable NECK:  No jugular venous distention, waveform within normal limits, carotid upstroke brisk and symmetric, no bruits, no thyromegaly LYMPHATICS:  No cervical adenopathy LUNGS:  Clear to auscultation bilaterally HEART:  RRR.  PMI not displaced or sustained,S1 and S2 within normal limits, no S3, no S4, no clicks, no rubs, no murmurs ABD:  Flat, positive bowel sounds normal in frequency in pitch, no bruits, no rebound, no guarding, no midline pulsatile mass, no hepatomegaly, no splenomegaly EXT:  2 plus pulses throughout, no edema, no cyanosis no clubbing SKIN:  No rashes no nodules NEURO:  Cranial nerves II through XII grossly intact, motor grossly intact throughout PSYCH:  Cognitively intact, oriented to person place and time   EKG:  EKG is not ordered today. The ekg ordered 12/14/16 demonstrates sinus rhythm.  Rate 75 bpm.  Cannot rule out prior septal infarct.    Recent Labs: 10/06/2016: ALT 8 11/15/2016: Hemoglobin 11.3; Platelets 271 11/21/2016: BUN 24; Creat 1.33; Potassium 4.9; Sodium 133; TSH 0.50    Lipid Panel    Component Value Date/Time   CHOL 143  05/02/2016 0605   TRIG 179 (H) 05/02/2016 0605   HDL 47 05/02/2016 0605   CHOLHDL 3.0 05/02/2016 0605   VLDL 36 05/02/2016 0605   LDLCALC 60 05/02/2016 0605      Wt Readings from Last 3 Encounters:  01/24/17 87.8 kg (193 lb 9.6 oz)  01/09/17 83.9 kg (185 lb)  12/29/16 84.8 kg (187 lb)      ASSESSMENT AND PLAN:  # Hypertensive heart disease:  Ms. Sanderford blood pressure remains poorly-controlled.  She has side effects from clonidine.  We will stop furosemide and clonidine.  Add HCTZ 25mg  daily and spironolactone 25mg  daily.  Check  BMP in 1 week.  She has severe hypertension that is attributable to poorly-controlled hypertension.  On review of her echo she had severe LVH in 2017 as well.  There is no evidence of SAM or outflow track obstruction.   # CV Disease prevention: LDL 60 04/2016. Continue atorvastatin and aspirin.    Current medicines are reviewed at length with the patient today.  The patient does not have concerns regarding medicines.  The following changes have been made:    Labs/ tests ordered today include:   Orders Placed This Encounter  Procedures  . Basic metabolic panel     Disposition:   FU with Kandice Schmelter C. Oval Linsey, MD, [facility] in 2 months.    This note was written with the assistance of speech recognition software.  Please excuse any transcriptional errors.  Signed, Hartwell Vandiver C. Oval Linsey, MD, [facility]  01/24/2017 5:31 PM    [place]

## 2017-01-24 NOTE — Patient Instructions (Signed)
Medication Instructions:  STOP CLONIDINE   STOP FUROSEMIDE  START SPIRONOLACTONE 25 MG DAILY   START HYDROCHLOROTHIAZIDE 25 MG DAILY   Labwork: BMET NEXT WEEK   Testing/Procedures: NONE  Follow-Up: Your physician recommends that you schedule a follow-up appointment in: 2 MONTH OV  Any Other Special Instructions Will Be Listed Below (If Applicable). MONITOR BLOOD PRESSURE AT HOME AND CALL NEXT WEEK WITH UPDATED BLOOD PRESSURES   If you need a refill on your cardiac medications before your next appointment, please call your pharmacy.

## 2017-02-05 ENCOUNTER — Encounter: Payer: Self-pay | Admitting: *Deleted

## 2017-02-05 LAB — BASIC METABOLIC PANEL
BUN/Creatinine Ratio: 16 (ref 9–23)
BUN: 26 mg/dL — ABNORMAL HIGH (ref 6–24)
CALCIUM: 9.3 mg/dL (ref 8.7–10.2)
CO2: 22 mmol/L (ref 20–29)
Chloride: 100 mmol/L (ref 96–106)
Creatinine, Ser: 1.63 mg/dL — ABNORMAL HIGH (ref 0.57–1.00)
GFR calc Af Amer: 44 mL/min/{1.73_m2} — ABNORMAL LOW (ref >59)
GFR, EST NON AFRICAN AMERICAN: 38 mL/min/{1.73_m2} — AB (ref >59)
Glucose: 375 mg/dL — ABNORMAL HIGH (ref 65–99)
POTASSIUM: 4.8 mmol/L (ref 3.5–5.2)
Sodium: 137 mmol/L (ref 134–144)

## 2017-02-07 ENCOUNTER — Telehealth: Payer: Self-pay | Admitting: *Deleted

## 2017-02-07 DIAGNOSIS — N289 Disorder of kidney and ureter, unspecified: Secondary | ICD-10-CM

## 2017-02-07 DIAGNOSIS — Z79899 Other long term (current) drug therapy: Secondary | ICD-10-CM

## 2017-02-07 NOTE — Telephone Encounter (Signed)
Discussed with Dr Oval Linsey and will have patient decrease Spironolactone to 25 mg 1/2 tablet daily  Advised patient and bmet orders placed in Epic

## 2017-02-07 NOTE — Telephone Encounter (Signed)
-----   Message from Skeet Latch, MD sent at 02/06/2017 11:31 AM EDT ----- Blood glucose remains very high.  Kidney function is a little worse.  Reduce spironolactone and repeat labs in 1 week.

## 2017-02-23 ENCOUNTER — Encounter: Payer: Self-pay | Admitting: *Deleted

## 2017-03-09 LAB — BASIC METABOLIC PANEL
BUN / CREAT RATIO: 19 (ref 9–23)
BUN: 27 mg/dL — AB (ref 6–24)
CHLORIDE: 103 mmol/L (ref 96–106)
CO2: 21 mmol/L (ref 20–29)
Calcium: 9.2 mg/dL (ref 8.7–10.2)
Creatinine, Ser: 1.39 mg/dL — ABNORMAL HIGH (ref 0.57–1.00)
GFR calc Af Amer: 54 mL/min/{1.73_m2} — ABNORMAL LOW (ref >59)
GFR calc non Af Amer: 46 mL/min/{1.73_m2} — ABNORMAL LOW (ref >59)
GLUCOSE: 236 mg/dL — AB (ref 65–99)
Potassium: 4.2 mmol/L (ref 3.5–5.2)
SODIUM: 140 mmol/L (ref 134–144)

## 2017-03-19 ENCOUNTER — Encounter: Payer: Self-pay | Admitting: *Deleted

## 2017-03-27 ENCOUNTER — Ambulatory Visit: Payer: Medicaid Other | Admitting: Cardiovascular Disease

## 2017-04-02 ENCOUNTER — Ambulatory Visit: Payer: Medicaid Other | Admitting: Family Medicine

## 2017-04-05 ENCOUNTER — Ambulatory Visit: Payer: Medicaid Other | Admitting: Family Medicine

## 2017-04-19 ENCOUNTER — Ambulatory Visit: Payer: Medicaid Other | Admitting: Family Medicine

## 2017-04-23 ENCOUNTER — Ambulatory Visit (INDEPENDENT_AMBULATORY_CARE_PROVIDER_SITE_OTHER): Payer: Medicaid Other | Admitting: Family Medicine

## 2017-04-23 ENCOUNTER — Encounter: Payer: Self-pay | Admitting: Family Medicine

## 2017-04-23 VITALS — BP 160/84 | HR 88 | Temp 97.9°F | Resp 14 | Ht 62.5 in | Wt 187.0 lb

## 2017-04-23 DIAGNOSIS — Z23 Encounter for immunization: Secondary | ICD-10-CM | POA: Diagnosis not present

## 2017-04-23 DIAGNOSIS — F430 Acute stress reaction: Secondary | ICD-10-CM

## 2017-04-23 DIAGNOSIS — F411 Generalized anxiety disorder: Secondary | ICD-10-CM | POA: Insufficient documentation

## 2017-04-23 DIAGNOSIS — I10 Essential (primary) hypertension: Secondary | ICD-10-CM | POA: Diagnosis not present

## 2017-04-23 DIAGNOSIS — N390 Urinary tract infection, site not specified: Secondary | ICD-10-CM

## 2017-04-23 DIAGNOSIS — E119 Type 2 diabetes mellitus without complications: Secondary | ICD-10-CM | POA: Diagnosis not present

## 2017-04-23 LAB — COMPLETE METABOLIC PANEL WITH GFR
AG Ratio: 1.1 (calc) (ref 1.0–2.5)
ALKALINE PHOSPHATASE (APISO): 72 U/L (ref 33–115)
ALT: 8 U/L (ref 6–29)
AST: 9 U/L — ABNORMAL LOW (ref 10–30)
Albumin: 3.6 g/dL (ref 3.6–5.1)
BUN/Creatinine Ratio: 12 (calc) (ref 6–22)
BUN: 21 mg/dL (ref 7–25)
CALCIUM: 8.8 mg/dL (ref 8.6–10.2)
CO2: 26 mmol/L (ref 20–32)
CREATININE: 1.73 mg/dL — AB (ref 0.50–1.10)
Chloride: 103 mmol/L (ref 98–110)
GFR, EST NON AFRICAN AMERICAN: 36 mL/min/{1.73_m2} — AB (ref >=60)
GFR, Est African American: 41 mL/min/{1.73_m2} — ABNORMAL LOW (ref >=60)
GLUCOSE: 346 mg/dL — AB (ref 65–99)
Globulin: 3.3 g/dL (calc) (ref 1.9–3.7)
Potassium: 4 mmol/L (ref 3.5–5.3)
Sodium: 136 mmol/L (ref 135–146)
Total Bilirubin: 0.3 mg/dL (ref 0.2–1.2)
Total Protein: 6.9 g/dL (ref 6.1–8.1)

## 2017-04-23 LAB — POCT URINALYSIS DIP (DEVICE)
Bilirubin Urine: NEGATIVE
GLUCOSE, UA: 500 mg/dL — AB
KETONES UR: NEGATIVE mg/dL
Nitrite: POSITIVE — AB
SPECIFIC GRAVITY, URINE: 1.02 (ref 1.005–1.030)
UROBILINOGEN UA: 0.2 mg/dL (ref 0.0–1.0)
pH: 5 (ref 5.0–8.0)

## 2017-04-23 LAB — POCT GLYCOSYLATED HEMOGLOBIN (HGB A1C): HEMOGLOBIN A1C: 10.8

## 2017-04-23 LAB — GLUCOSE, CAPILLARY: Glucose-Capillary: 358 mg/dL — ABNORMAL HIGH (ref 65–99)

## 2017-04-23 MED ORDER — SULFAMETHOXAZOLE-TRIMETHOPRIM 800-160 MG PO TABS: 1.0000 | ORAL_TABLET | Freq: Two times a day (BID) | ORAL | 0 refills | Status: DC

## 2017-04-23 NOTE — Patient Instructions (Signed)
Urinalysis is consistent with a urinary tract infection.  I will treat empirically with broad-spectrum antibiotic.  Bactrim 800-360 mg every 12 hours for 10 days.  Increase water intake to 6-8 glasses/day, wipe from front to back, practice good perianal hygiene.   Your hemoglobin A1c is 10.8, which is improved.  Goal is less than 7.  Continue Levemir 30 units in am and pm Continue Novalog 10 units for meal coverage.  Recommend a carbohydrate modified diet.   Diabetes Mellitus and Food It is important for you to manage your blood sugar (glucose) level. Your blood glucose level can be greatly affected by what you eat. Eating healthier foods in the appropriate amounts throughout the day at about the same time each day will help you control your blood glucose level. It can also help slow or prevent worsening of your diabetes mellitus. Healthy eating may even help you improve the level of your blood pressure and reach or maintain a healthy weight. General recommendations for healthful eating and cooking habits include: Eating meals and snacks regularly. Avoid going long periods of time without eating to lose weight. Eating a diet that consists mainly of plant-based foods, such as fruits, vegetables, nuts, legumes, and whole grains. Using low-heat cooking methods, such as baking, instead of high-heat cooking methods, such as deep frying.  Work with your dietitian to make sure you understand how to use the Nutrition Facts information on food labels. How can food affect me? Carbohydrates Carbohydrates affect your blood glucose level more than any other type of food. Your dietitian will help you determine how many carbohydrates to eat at each meal and teach you how to count carbohydrates. Counting carbohydrates is important to keep your blood glucose at a healthy level, especially if you are using insulin or taking certain medicines for diabetes mellitus. Alcohol Alcohol can cause sudden decreases in blood  glucose (hypoglycemia), especially if you use insulin or take certain medicines for diabetes mellitus. Hypoglycemia can be a life-threatening condition. Symptoms of hypoglycemia (sleepiness, dizziness, and disorientation) are similar to symptoms of having too much alcohol. If your health care provider has given you approval to drink alcohol, do so in moderation and use the following guidelines: Women should not have more than one drink per day, and men should not have more than two drinks per day. One drink is equal to: 12 oz of beer. 5 oz of wine. 1 oz of hard liquor. Do not drink on an empty stomach. Keep yourself hydrated. Have water, diet soda, or unsweetened iced tea. Regular soda, juice, and other mixers might contain a lot of carbohydrates and should be counted.  What foods are not recommended? As you make food choices, it is important to remember that all foods are not the same. Some foods have fewer nutrients per serving than other foods, even though they might have the same number of calories or carbohydrates. It is difficult to get your body what it needs when you eat foods with fewer nutrients. Examples of foods that you should avoid that are high in calories and carbohydrates but low in nutrients include: Trans fats (most processed foods list trans fats on the Nutrition Facts label). Regular soda. Juice. Candy. Sweets, such as cake, pie, doughnuts, and cookies. Fried foods.  What foods can I eat? Eat nutrient-rich foods, which will nourish your body and keep you healthy. The food you should eat also will depend on several factors, including: The calories you need. The medicines you take. Your weight. Your  blood glucose level. Your blood pressure level. Your cholesterol level.  You should eat a variety of foods, including: Protein. Lean cuts of meat. Proteins low in saturated fats, such as fish, egg whites, and beans. Avoid processed meats. Fruits and vegetables. Fruits  and vegetables that may help control blood glucose levels, such as apples, mangoes, and yams. Dairy products. Choose fat-free or low-fat dairy products, such as milk, yogurt, and cheese. Grains, bread, pasta, and rice. Choose whole grain products, such as multigrain bread, whole oats, and brown rice. These foods may help control blood pressure. Fats. Foods containing healthful fats, such as nuts, avocado, olive oil, canola oil, and fish.  Does everyone with diabetes mellitus have the same meal plan? Because every person with diabetes mellitus is different, there is not one meal plan that works for everyone. It is very important that you meet with a dietitian who will help you create a meal plan that is just right for you. This information is not intended to replace advice given to you by your health care provider. Make sure you discuss any questions you have with your health care provider. Document Released: 02/23/2005 Document Revised: 11/04/2015 Document Reviewed: 04/25/2013 Elsevier Interactive Patient Education  2017 [employer]  Urinary Tract Infection, Adult A urinary tract infection (UTI) is an infection of any part of the urinary tract. The urinary tract includes the:  Kidneys.  Ureters.  Bladder.  Urethra.  These organs make, store, and get rid of pee (urine) in the body. Follow these instructions at home:  Take over-the-counter and prescription medicines only as told by your doctor.  If you were prescribed an antibiotic medicine, take it as told by your doctor. Do not stop taking the antibiotic even if you start to feel better.  Avoid the following drinks: ? Alcohol. ? Caffeine. ? Tea. ? Carbonated drinks.  Drink enough fluid to keep your pee clear or pale yellow.  Keep all follow-up visits as told by your doctor. This is important.  Make sure to: ? Empty your bladder often and completely. Do not to hold pee for long periods of time. ? Empty your bladder  before and after sex. ? Wipe from front to back after a bowel movement if you are female. Use each tissue one time when you wipe. Contact a doctor if:  You have back pain.  You have a fever.  You feel sick to your stomach (nauseous).  You throw up (vomit).  Your symptoms do not get better after 3 days.  Your symptoms go away and then come back. Get help right away if:  You have very bad back pain.  You have very bad lower belly (abdominal) pain.  You are throwing up and cannot keep down any medicines or water. This information is not intended to replace advice given to you by your health care provider. Make sure you discuss any questions you have with your health care provider. Document Released: 11/15/2007 Document Revised: 11/04/2015 Document Reviewed: 04/19/2015 Elsevier Interactive Patient Education  [employer].

## 2017-04-23 NOTE — Progress Notes (Signed)
Christina Orozco, a 46 year old female with a history of uncontrolled type 2 diabetes mellitus and uncontrolled hypertension presents accompanied by daughter for follow up.  Patient says that she has been taking all antidiabetic medications consistently.  However, she is not been following up carbohydrate modified diet or exercising.  She continues to take Levemir 30 units twice daily and cover his meals with NovoLog 10 units.  She did not bring glucometer to appointment today.  She currently denies dizziness, blurred vision, headache, polyuria, polydipsia, or polyphagia.  She also denies T.   Ms. Kage was recently referred to cardiology for hypertensive urgency.  She says that she is not been taking all medications consistently.  She is currently out of amlodipine.  She took other antihypertensive medications in the lobby this a.m. blood pressure scratch that.  Blood pressure elevated at present.    She has not been checking blood pressures at home she has a blood pressure monitor at home.   She denies chest pain, fatigue, lower extremity edema or syncope.  Past Medical History:  Diagnosis Date  . Asthma   . DDD (degenerative disc disease), cervical   . Diabetes mellitus   . Diabetes mellitus ([place])   . GERD (gastroesophageal reflux disease)   . Hypertension   . Hypertension   . Neuropathy   . Stroke [facility])    Social History   Socioeconomic History  . Marital status: Single    Spouse name: Not on file  . Number of children: 3  . Years of education: 12+  . Highest education level: Not on file  Social Needs  . Financial resource strain: Not on file  . Food insecurity - worry: Not on file  . Food insecurity - inability: Not on file  . Transportation needs - medical: Not on file  . Transportation needs - non-medical: Not on file  Occupational History  . Occupation: Research officer, trade union and gam[employer]   Tobacco Use  . Smoking status: Never Smoker  . Smokeless tobacco: Never Used  Substance and  Sexual Activity  . Alcohol use: No  . Drug use: No  . Sexual activity: Not on file  Other Topics Concern  . Not on file  Social History Narrative   Lives with daughter   Caffeine use: daily   Review of Systems  Constitutional: Negative.  Negative for diaphoresis.  HENT: Negative.   Eyes: Negative for blurred vision and double vision.  Cardiovascular: Negative for chest pain and palpitations.  Gastrointestinal: Negative for abdominal pain.  Genitourinary: Negative.   Skin: Negative.   Neurological: Negative for dizziness and headaches.  Endo/Heme/Allergies: Positive for polydipsia.  Psychiatric/Behavioral:       Patient is anxious, her mother was recently diagnosed with liver cancer and has a poor prognosis   Physical Exam  HENT:  Head: Normocephalic and atraumatic.  Right Ear: External ear normal.  Left Ear: External ear normal.  Nose: Nose normal.  Mouth/Throat: Oropharynx is clear and moist.  Cardiovascular: Normal rate. An irregular rhythm present.  Pulses:      Carotid pulses are 2+ on the right side, and 2+ on the left side.      Radial pulses are 2+ on the right side, and 2+ on the left side.       Femoral pulses are 2+ on the right side, and 2+ on the left side.      Popliteal pulses are 2+ on the right side, and 2+ on the left side.  Dorsalis pedis pulses are 2+ on the right side, and 2+ on the left side.       Posterior tibial pulses are 2+ on the right side, and 2+ on the left side.  Pulmonary/Chest: Effort normal. She has no decreased breath sounds. She has no wheezes. She has no rhonchi. She has no rales.  Abdominal: Soft. Normal appearance and bowel sounds are normal.  Skin: Skin is warm and dry.  Psychiatric: Mood, memory, affect and judgment normal.  BP (!) 160/84   Pulse 88   Temp 97.9 F (36.6 C) (Oral)   Resp 14   Ht 5' 2.5" (1.588 m)   Wt 187 lb (84.8 kg)   LMP 04/13/2017   SpO2 100%   BMI 33.66 kg/m   Plan   1. Type 2 diabetes mellitus  without complication, without long-term current use of insulin (HCC) Hemoglobin A1c has reduced to 10.8 from 11.33 months ago.  Patient has not been following a carbohydrate modified, low-fat diet or exercising routinely.  Discussed the importance of monitoring simple carbohydrates.  Patient given a copy of a low-fat carbohydrate modified 1800-calorie diet.  She has been referred to diabetes education in the past and has not gone due to financial constraints.  She states that she knows what to eat but it is a matter of following a plan consistently.  There have been some probable compliance issues here. I have discussed with her the great importance of following the treatment plan exactly as directed in order to achieve a good medical outcome. - HgB A1c - COMPLETE METABOLIC PANEL WITH GFR  2. Urinary tract infection without hematuria, site unspecified Urinalysis, positive nitrites.  Patient complaining of urinary frequency, urgency and dysuria.  We will treat empirically with a broad-spectrum antibiotic.  Will follow urine culture. - Urine Culture - sulfamethoxazole-trimethoprim (BACTRIM DS,SEPTRA DS) 800-160 MG tablet; Take 1 tablet 2 (two) times daily for 10 days by mouth.  Dispense: 20 tablet; Refill: 0  3. Essential hypertension Blood pressure is is above goal.  Patient is scheduled to follow-up with Dr. Skeet Latch, cardiology on 04/26/2017.  Blood pressure improved manually.  Patient took blood pressure medication while waiting in lobby.  Discussed the importance of taking medications consistently.  4.  Anxiety as an acute reaction to exceptional stress Patient reports increased anxiety on today.  Her mother was recently diagnosed with liver cancer, with a poor prognosis.  Patient is not interested in following with therapist at this point.  Recommend that she gives me a call if she wants therapy.   5. Need for immunization against influenza - Flu Vaccine QUAD 36+ mos IM  RTC: 3 months  for type 2 diabetes mellitus   [name]  MSN, FNP-C [facility] [id number] [place], [place] [address] [phone]

## 2017-04-25 ENCOUNTER — Other Ambulatory Visit: Payer: Self-pay | Admitting: Family Medicine

## 2017-04-25 DIAGNOSIS — N183 Chronic kidney disease, stage 3 unspecified: Secondary | ICD-10-CM

## 2017-04-25 NOTE — Progress Notes (Signed)
Christina Orozco, a 46 year old female presented April 23, 2017 for follow-up of uncontrolled diabetes.  Patient's hemoglobin A1c was 10.3, which is improved from previous.  Complete metabolic panel creatinine level is 1.73 and GFR is 41, which is consistent with stage III chronic kidney disease.  Patient warrants a referral to nephrology for further workup and evaluation.   [name]  MSN, FNP-C [facility] [address] [place], [place] [address] [phone]

## 2017-04-25 NOTE — Progress Notes (Signed)
Called and spoke with patient. Advised of elevated creatinine level and that we will send in referral for stage 3 kidney disease to nephrology. Patient verbalized understanding and had no questions at this time. Thanks !

## 2017-04-26 ENCOUNTER — Other Ambulatory Visit: Payer: Self-pay | Admitting: Family Medicine

## 2017-04-26 ENCOUNTER — Ambulatory Visit: Payer: Medicaid Other | Admitting: Cardiovascular Disease

## 2017-04-26 ENCOUNTER — Telehealth: Payer: Self-pay

## 2017-04-26 DIAGNOSIS — N390 Urinary tract infection, site not specified: Secondary | ICD-10-CM

## 2017-04-26 LAB — URINE CULTURE
MICRO NUMBER:: 81271623
SPECIMEN QUALITY:: ADEQUATE

## 2017-04-26 MED ORDER — CIPROFLOXACIN HCL 250 MG PO TABS: 250.0000 mg | ORAL_TABLET | Freq: Two times a day (BID) | ORAL | 0 refills | Status: AC

## 2017-04-26 NOTE — Progress Notes (Deleted)
Cardiology Office Note   Date:  04/26/2017   ID:  Christina Orozco, DOB 08/13/1970, MRN [id number]  PCP:  Dorena Dew, FNP  Cardiologist:   Skeet Latch, MD   No chief complaint on file.    History of Present Illness: Christina Orozco is a 46 y.o. female with poorly-controlled hypertension, severe LVH, diabetes, prior stroke, and asthma who presents for follow-up. She was initially seen 12/2016 for an evaluation of hypertension and LVH.  She had an echo 04/2016 that revealed LVEF 65-70% and mild basal septum hypertrophy.  She had a repeat echo 12/01/16 that showed LVEF 65-70% and severe LVH.  She reports that she was initially diagnosed with hypertension in high school.  The only itme it was well-controlled was when she was pregnant. She has several family members with hypertension.  Her mother had a heart attack at age 71 and several other family members had premature CAD.  At her initial appointment carvedilol was increased and amlodipine was added to her regimen.  At her last appointment Christina Orozco's blood pressure remained poorly-controlled.   She had side effects on clonidine so this was discontinued.  Furosemide was switched to hydrochlorothiazide.    Past Medical History:  Diagnosis Date  . Asthma   . DDD (degenerative disc disease), cervical   . Diabetes mellitus   . Diabetes mellitus ([place])   . GERD (gastroesophageal reflux disease)   . Hypertension   . Hypertension   . Neuropathy   . Stroke [facility])     Past Surgical History:  Procedure Laterality Date  . ADENOIDECTOMY    . APPENDECTOMY    . CERVICAL SPINE SURGERY  06/2012   C5-C6  . EYE SURGERY     left eye surgery   . SHOULDER SURGERY    . TONSILLECTOMY       Current Outpatient Medications  Medication Sig Dispense Refill  . ACCU-CHEK AVIVA PLUS test strip CHECK BLOOD SUGAR PRIOR TO MEALS AND AT BEDTIME. USE AS INSTRUCTED. 300 each 12  . albuterol (PROVENTIL HFA;VENTOLIN HFA) 108 (90 Base) MCG/ACT  inhaler Inhale 1-2 puffs into the lungs every 6 (six) hours as needed for wheezing or shortness of breath. 1 Inhaler 1  . amLODipine (NORVASC) 5 MG tablet Take 1 tablet (5 mg total) by mouth daily. 90 tablet 3  . Aspirin (ASPIR-81 PO) Take 81 mg by mouth daily.     Marland Kitchen atorvastatin (LIPITOR) 40 MG tablet Take 1 tablet (40 mg total) by mouth daily at 6 PM. 30 tablet 5  . carvedilol (COREG) 25 MG tablet Take 1 tablet (25 mg total) by mouth 2 (two) times daily with a meal. Take 25 mg twice daily 180 tablet 3  . ferrous sulfate 325 (65 FE) MG tablet Take 1 tablet (325 mg total) by mouth daily with breakfast. 30 tablet 3  . hydrochlorothiazide (HYDRODIURIL) 25 MG tablet Take 1 tablet (25 mg total) by mouth daily. 30 tablet 5  . insulin aspart (NOVOLOG) 100 UNIT/ML FlexPen Inject 10 Units into the skin 3 (three) times daily with meals. 15 mL 11  . Insulin Detemir (LEVEMIR FLEXTOUCH) 100 UNIT/ML Pen 30 units in am and 20 units pm 15 mL 11  . Insulin Pen Needle (PEN NEEDLES) 32G X 5 MM MISC 1 each by Does not apply route 5 (five) times daily. 200 each 3  . Olopatadine HCl (PATADAY) 0.2 % SOLN Apply 1 drop to eye daily. (Patient not taking: Reported on 04/23/2017) 1 Bottle 0  .  omeprazole (PRILOSEC) 40 MG capsule Take 1 capsule (40 mg total) by mouth daily. Reported on 06/08/2015 (Patient not taking: Reported on 04/23/2017) 30 capsule 5  . spironolactone (ALDACTONE) 25 MG tablet Take 25 mg by mouth as directed. 1/2 tablet by mouth daily    . sulfamethoxazole-trimethoprim (BACTRIM DS,SEPTRA DS) 800-160 MG tablet Take 1 tablet 2 (two) times daily for 10 days by mouth. 20 tablet 0  . traZODone (DESYREL) 50 MG tablet TAKE 1/2 TABLET(25 MG) BY MOUTH AT BEDTIME AS NEEDED FOR SLEEP (Patient not taking: Reported on 04/23/2017) 30 tablet 0   No current facility-administered medications for this visit.     Allergies:   Lisinopril; Penicillins; and Gabapentin    Social History:  The patient  reports that  has never  smoked. she has never used smokeless tobacco. She reports that she does not drink alcohol or use drugs.   Family History:  The patient's family history includes Breast cancer (age of onset: 26) in her sister; CAD in her mother; Cancer in her unknown relative; Diabetes in her unknown relative; Heart disease in her unknown relative; Heart failure in her mother; Seizures in her unknown relative; Vascular Disease in her mother.    ROS:  Please see the history of present illness.   Otherwise, review of systems are positive for none.   All other systems are reviewed and negative.    PHYSICAL EXAM: VS:  LMP 04/13/2017  , BMI There is no height or weight on file to calculate BMI. GENERAL:  Well appearing HEENT: Pupils equal round and reactive, fundi not visualized, oral mucosa unremarkable NECK:  No jugular venous distention, waveform within normal limits, carotid upstroke brisk and symmetric, no bruits, no thyromegaly LYMPHATICS:  No cervical adenopathy LUNGS:  Clear to auscultation bilaterally HEART:  RRR.  PMI not displaced or sustained,S1 and S2 within normal limits, no S3, no S4, no clicks, no rubs, no murmurs ABD:  Flat, positive bowel sounds normal in frequency in pitch, no bruits, no rebound, no guarding, no midline pulsatile mass, no hepatomegaly, no splenomegaly EXT:  2 plus pulses throughout, no edema, no cyanosis no clubbing SKIN:  No rashes no nodules NEURO:  Cranial nerves II through XII grossly intact, motor grossly intact throughout PSYCH:  Cognitively intact, oriented to person place and time   EKG:  EKG is not ordered today. The ekg ordered 12/14/16 demonstrates sinus rhythm.  Rate 75 bpm.  Cannot rule out prior septal infarct.    Recent Labs: 11/15/2016: Hemoglobin 11.3; Platelets 271 11/21/2016: TSH 0.50 04/23/2017: ALT 8; BUN 21; Creat 1.73; Potassium 4.0; Sodium 136    Lipid Panel    Component Value Date/Time   CHOL 143 05/02/2016 0605   TRIG 179 (H) 05/02/2016 0605    HDL 47 05/02/2016 0605   CHOLHDL 3.0 05/02/2016 0605   VLDL 36 05/02/2016 0605   LDLCALC 60 05/02/2016 0605      Wt Readings from Last 3 Encounters:  04/23/17 84.8 kg (187 lb)  01/24/17 87.8 kg (193 lb 9.6 oz)  01/09/17 83.9 kg (185 lb)      ASSESSMENT AND PLAN:  # Hypertensive heart disease:  Christina Orozco blood pressure remains poorly-controlled.  She has side effects from clonidine.  We will stop furosemide and clonidine.  Add HCTZ 25mg  daily and spironolactone 25mg  daily.  Check BMP in 1 week.  She has severe hypertension that is attributable to poorly-controlled hypertension.  On review of her echo she had severe LVH in 2017 as  well.  There is no evidence of SAM or outflow track obstruction.   # CV Disease prevention: LDL 60 04/2016. Continue atorvastatin and aspirin.    Current medicines are reviewed at length with the patient today.  The patient does not have concerns regarding medicines.  The following changes have been made:    Labs/ tests ordered today include:   No orders of the defined types were placed in this encounter.    Disposition:   FU with Rheannon Cerney C. Oval Linsey, MD, [facility] in 2 months.    This note was written with the assistance of speech recognition software.  Please excuse any transcriptional errors.  Signed, Brentin Shin C. Oval Linsey, MD, [facility]  04/26/2017 7:55 AM    [place]

## 2017-04-26 NOTE — Progress Notes (Signed)
Reviewed labs, urine cultures growing E. coli.  Patient was prescribed Bactrim 800-160 mg on April 23, 2017.  E. coli is not scratch that E. coli is resistant to Bactrim will change antibiotic to ciprofloxacin 250 mg twice daily for 10 days.   Christina Pounds  MSN, FNP-C [facility] [address] [place], [place] [address] [phone]

## 2017-04-26 NOTE — Telephone Encounter (Signed)
-----   Message from Dorena Dew, [place] sent at 04/26/2017  1:18 PM EST ----- Regarding: Lab results Please inform Christina Orozco that urine culture yielded E. coli, which is resistant to Bactrim 800-160 mg.  Will discontinue Bactrim and add ciprofloxacin 250 mg twice daily for 10 days. Please have her follow-up in clinic as scheduled.  Thanks

## 2017-04-26 NOTE — Telephone Encounter (Signed)
Called and spoke with patient. Advised of culture showing resistance to bactrim and the need to change the antibiotic. Advised her to stop taking the bactrim and to start cipro 500mg  twice daily for 10 days. Patient verbalized understanding. Thanks!

## 2017-05-15 ENCOUNTER — Ambulatory Visit (INDEPENDENT_AMBULATORY_CARE_PROVIDER_SITE_OTHER): Payer: Medicaid Other | Admitting: Cardiovascular Disease

## 2017-05-15 ENCOUNTER — Encounter: Payer: Self-pay | Admitting: *Deleted

## 2017-05-15 ENCOUNTER — Encounter: Payer: Self-pay | Admitting: Cardiovascular Disease

## 2017-05-15 VITALS — BP 180/90 | HR 78 | Ht 62.5 in | Wt 189.0 lb

## 2017-05-15 DIAGNOSIS — I10 Essential (primary) hypertension: Secondary | ICD-10-CM | POA: Diagnosis not present

## 2017-05-15 DIAGNOSIS — I519 Heart disease, unspecified: Secondary | ICD-10-CM | POA: Diagnosis not present

## 2017-05-15 DIAGNOSIS — I5189 Other ill-defined heart diseases: Secondary | ICD-10-CM

## 2017-05-15 MED ORDER — DOXAZOSIN MESYLATE 4 MG PO TABS: 4.0000 mg | ORAL_TABLET | Freq: Every day | ORAL | 5 refills | Status: DC

## 2017-05-15 MED ORDER — AMLODIPINE BESYLATE 10 MG PO TABS
10.0000 mg | ORAL_TABLET | Freq: Every day | ORAL | 3 refills | Status: DC
Start: 2017-05-15 — End: 2017-07-19

## 2017-05-15 NOTE — Patient Instructions (Addendum)
Medication Instructions:  INCREASE YOUR AMLODIPINE TO 10 MG DAILY   START DOXAZOSIN 4 MG DAILY   Labwork: NONE  Testing/Procedures: NONE  Follow-Up: Your physician recommends that you schedule a follow-up appointment in: NEXT WEEK WITH PA/NP/PHARM D FOR BLOOD PRESSURE  Your physician recommends that you schedule a follow-up appointment in: DR Wiley Ford 2 MONTHS   MONITOR YOUR BLOOD PRESSURE AT HOME AND IF NO IMPROVEMENT BY THE END OF THE WEEK CALL THE OFFICE. THE OFFICE WILL BE CLOSING AT NOON ON Friday CALL BY Thursday AFTERNOON OR [facility] Friday MORNING   If you need a refill on your cardiac medications before your next appointment, please call your pharmacy.

## 2017-05-15 NOTE — Progress Notes (Signed)
Cardiology Office Note   Date:  05/15/2017   ID:  Christina Orozco, DOB Oct 22, 1970, MRN [id number]  PCP:  Christina Dew, FNP  Cardiologist:   Christina Latch, MD   No chief complaint on file.    History of Present Illness: Christina Orozco is a 46 y.o. female with poorly-controlled hypertension, severe LVH, diabetes, prior stroke, and asthma who presents for follow-up. She was initially seen 12/2016 for an evaluation of hypertension and LVH.  She had an echo 04/2016 that revealed LVEF 65-70% and mild basal septum hypertrophy.  She had a repeat echo 12/01/16 that showed LVEF 65-70% and severe LVH.  She reports that she was initially diagnosed with hypertension in high school.  The only itme it was well-controlled was when she was pregnant. She has several family members with hypertension.  Her mother had a heart attack at age 32 and several other family members had premature CAD.  At her initial appointment carvedilol was increased and amlodipine was added to her regimen.  At her last appointment [name]'s blood pressure remained poorly-controlled.   She had side effects on clonidine so this was discontinued.  Furosemide was switched to hydrochlorothiazide.  Since making this change her blood pressure remains poorly-controlled.  Her BP at home has been in the 180s/80s-100s.  She continues to have swelling in her feet despite wearing TED hose.  She denies orthopnea or PND.  She hasn't noted any chest pain or vision changes but does have headaches.  She tries to walk at work and elevates her legs when sitting.    Past Medical History:  Diagnosis Date  . Asthma   . DDD (degenerative disc disease), cervical   . Diabetes mellitus   . Diabetes mellitus ([place])   . GERD (gastroesophageal reflux disease)   . Hypertension   . Hypertension   . Neuropathy   . Stroke [facility])     Past Surgical History:  Procedure Laterality Date  . ADENOIDECTOMY    . APPENDECTOMY    . CERVICAL SPINE  SURGERY  06/2012   C5-C6  . EYE SURGERY     left eye surgery   . SHOULDER SURGERY    . TONSILLECTOMY       Current Outpatient Medications  Medication Sig Dispense Refill  . albuterol (PROVENTIL HFA;VENTOLIN HFA) 108 (90 Base) MCG/ACT inhaler Inhale 1-2 puffs into the lungs every 6 (six) hours as needed for wheezing or shortness of breath. 1 Inhaler 1  . amLODipine (NORVASC) 10 MG tablet Take 1 tablet (10 mg total) by mouth daily. 90 tablet 3  . Aspirin (ASPIR-81 PO) Take 81 mg by mouth daily.     Marland Kitchen atorvastatin (LIPITOR) 40 MG tablet Take 1 tablet (40 mg total) by mouth daily at 6 PM. 30 tablet 5  . carvedilol (COREG) 25 MG tablet Take 1 tablet (25 mg total) by mouth 2 (two) times daily with a meal. Take 25 mg twice daily 180 tablet 3  . hydrochlorothiazide (HYDRODIURIL) 25 MG tablet Take 1 tablet (25 mg total) by mouth daily. 30 tablet 5  . insulin aspart (NOVOLOG) 100 UNIT/ML FlexPen Inject 10 Units into the skin 3 (three) times daily with meals. 15 mL 11  . Insulin Detemir (LEVEMIR FLEXTOUCH) 100 UNIT/ML Pen 30 units in am and 20 units pm 15 mL 11  . spironolactone (ALDACTONE) 25 MG tablet Take 25 mg by mouth as directed. 1/2 tablet by mouth daily    . doxazosin (CARDURA) 4 MG tablet Take  1 tablet (4 mg total) by mouth daily. 30 tablet 5   No current facility-administered medications for this visit.     Allergies:   Lisinopril; Penicillins; and Gabapentin    Social History:  The patient  reports that  has never smoked. she has never used smokeless tobacco. She reports that she does not drink alcohol or use drugs.   Family History:  The patient's family history includes Breast cancer (age of onset: 5) in her sister; CAD in her mother; Cancer in her unknown relative; Diabetes in her unknown relative; Heart disease in her unknown relative; Heart failure in her mother; Seizures in her unknown relative; Vascular Disease in her mother.    ROS:  Please see the history of present  illness.   Otherwise, review of systems are positive for none.   All other systems are reviewed and negative.    PHYSICAL EXAM: VS:  BP (!) 180/90   Pulse 78   Ht 5' 2.5" (1.588 m)   Wt 189 lb (85.7 kg)   BMI 34.02 kg/m  , BMI Body mass index is 34.02 kg/m. GENERAL:  Well appearing HEENT: Pupils equal round and reactive, fundi not visualized, oral mucosa unremarkable NECK:  No jugular venous distention, waveform within normal limits, carotid upstroke brisk and symmetric, no bruits, no thyromegaly LUNGS:  Clear to auscultation bilaterally HEART:  RRR.  PMI not displaced or sustained,S1 and S2 within normal limits, no S3, no S4, no clicks, no rubs, no murmurs ABD:  Flat, positive bowel sounds normal in frequency in pitch, no bruits, no rebound, no guarding, no midline pulsatile mass, no hepatomegaly, no splenomegaly EXT:  2 plus pulses throughout, no edema, no cyanosis no clubbing SKIN:  No rashes no nodules NEURO:  Cranial nerves II through XII grossly intact, motor grossly intact throughout PSYCH:  Cognitively intact, oriented to person place and time   EKG:  EKG is not ordered today. The ekg ordered 12/14/16 demonstrates sinus rhythm.  Rate 75 bpm.  Cannot rule out prior septal infarct.    Recent Labs: 11/15/2016: Hemoglobin 11.3; Platelets 271 11/21/2016: TSH 0.50 04/23/2017: ALT 8; BUN 21; Creat 1.73; Potassium 4.0; Sodium 136    Lipid Panel    Component Value Date/Time   CHOL 143 05/02/2016 0605   TRIG 179 (H) 05/02/2016 0605   HDL 47 05/02/2016 0605   CHOLHDL 3.0 05/02/2016 0605   VLDL 36 05/02/2016 0605   LDLCALC 60 05/02/2016 0605      Wt Readings from Last 3 Encounters:  05/15/17 189 lb (85.7 kg)  04/23/17 187 lb (84.8 kg)  01/24/17 193 lb 9.6 oz (87.8 kg)      ASSESSMENT AND PLAN:  # Hypertensive heart disease:  Blood pressure remains elevated.   She is also having headaches.  We will increase her amlodipine to 10 mg daily.  Add doxazosin 4mg  qhs.  Continue  carvedilol, HCTZ, spironolactone, and amlodipine.   She will check her BP regularly and bring this to follow up.  She can't be on ACE-I or ARB 2/2 angioedema.   # CV Disease prevention: LDL 60 04/2016. Continue atorvastatin and aspirin.    Current medicines are reviewed at length with the patient today.  The patient does not have concerns regarding medicines.  The following changes have been made:    Labs/ tests ordered today include:   No orders of the defined types were placed in this encounter.    Disposition:   FU with Ligia Duguay C. Oval Linsey, MD, [facility]  in 2 months.    This note was written with the assistance of speech recognition software.  Please excuse any transcriptional errors.  Signed, Billy Rocco C. Oval Linsey, MD, [facility]  05/15/2017 5:12 PM    [place]

## 2017-05-29 ENCOUNTER — Ambulatory Visit: Payer: Medicaid Other

## 2017-06-07 ENCOUNTER — Ambulatory Visit: Payer: Medicaid Other

## 2017-06-29 ENCOUNTER — Emergency Department (HOSPITAL_COMMUNITY): Payer: Medicaid Other

## 2017-06-29 ENCOUNTER — Encounter (HOSPITAL_COMMUNITY): Payer: Self-pay | Admitting: *Deleted

## 2017-06-29 ENCOUNTER — Other Ambulatory Visit: Payer: Self-pay

## 2017-06-29 DIAGNOSIS — H53132 Sudden visual loss, left eye: Secondary | ICD-10-CM | POA: Diagnosis present

## 2017-06-29 DIAGNOSIS — Z5321 Procedure and treatment not carried out due to patient leaving prior to being seen by health care provider: Secondary | ICD-10-CM | POA: Diagnosis not present

## 2017-06-29 LAB — CBC
HCT: 32.7 % — ABNORMAL LOW (ref 36.0–46.0)
Hemoglobin: 11 g/dL — ABNORMAL LOW (ref 12.0–15.0)
MCH: 28.6 pg (ref 26.0–34.0)
MCHC: 33.6 g/dL (ref 30.0–36.0)
MCV: 84.9 fL (ref 78.0–100.0)
PLATELETS: 275 10*3/uL (ref 150–400)
RBC: 3.85 MIL/uL — AB (ref 3.87–5.11)
RDW: 12.8 % (ref 11.5–15.5)
WBC: 6.7 10*3/uL (ref 4.0–10.5)

## 2017-06-29 LAB — CBG MONITORING, ED: GLUCOSE-CAPILLARY: 213 mg/dL — AB (ref 65–99)

## 2017-06-29 LAB — I-STAT CHEM 8, ED
BUN: 27 mg/dL — AB (ref 6–20)
CHLORIDE: 100 mmol/L — AB (ref 101–111)
Calcium, Ion: 1.38 mmol/L (ref 1.15–1.40)
Creatinine, Ser: 1.5 mg/dL — ABNORMAL HIGH (ref 0.44–1.00)
Glucose, Bld: 218 mg/dL — ABNORMAL HIGH (ref 65–99)
HEMATOCRIT: 33 % — AB (ref 36.0–46.0)
Hemoglobin: 11.2 g/dL — ABNORMAL LOW (ref 12.0–15.0)
POTASSIUM: 3.7 mmol/L (ref 3.5–5.1)
SODIUM: 139 mmol/L (ref 135–145)
TCO2: 28 mmol/L (ref 22–32)

## 2017-06-29 LAB — COMPREHENSIVE METABOLIC PANEL
ALK PHOS: 72 U/L (ref 38–126)
ALT: 13 U/L — ABNORMAL LOW (ref 14–54)
ANION GAP: 9 (ref 5–15)
AST: 19 U/L (ref 15–41)
Albumin: 3.7 g/dL (ref 3.5–5.0)
BUN: 24 mg/dL — ABNORMAL HIGH (ref 6–20)
CHLORIDE: 103 mmol/L (ref 101–111)
CO2: 24 mmol/L (ref 22–32)
Calcium: 10.3 mg/dL (ref 8.9–10.3)
Creatinine, Ser: 1.5 mg/dL — ABNORMAL HIGH (ref 0.44–1.00)
GFR calc Af Amer: 48 mL/min — ABNORMAL LOW (ref >60)
GFR calc non Af Amer: 41 mL/min — ABNORMAL LOW (ref >60)
Glucose, Bld: 220 mg/dL — ABNORMAL HIGH (ref 65–99)
POTASSIUM: 3.5 mmol/L (ref 3.5–5.1)
SODIUM: 136 mmol/L (ref 135–145)
Total Bilirubin: 0.4 mg/dL (ref 0.3–1.2)
Total Protein: 7.8 g/dL (ref 6.5–8.1)

## 2017-06-29 LAB — I-STAT TROPONIN, ED: Troponin i, poc: 0.01 ng/mL (ref 0.00–0.08)

## 2017-06-29 LAB — I-STAT BETA HCG BLOOD, ED (MC, WL, AP ONLY)

## 2017-06-29 LAB — DIFFERENTIAL
BASOS PCT: 0 %
Basophils Absolute: 0 10*3/uL (ref 0.0–0.1)
EOS ABS: 0.2 10*3/uL (ref 0.0–0.7)
EOS PCT: 2 %
Lymphocytes Relative: 44 %
Lymphs Abs: 2.9 10*3/uL (ref 0.7–4.0)
Monocytes Absolute: 0.3 10*3/uL (ref 0.1–1.0)
Monocytes Relative: 4 %
NEUTROS PCT: 50 %
Neutro Abs: 3.3 10*3/uL (ref 1.7–7.7)

## 2017-06-29 LAB — PROTIME-INR
INR: 0.87
PROTHROMBIN TIME: 11.7 s (ref 11.4–15.2)

## 2017-06-29 LAB — APTT: aPTT: 29 seconds (ref 24–36)

## 2017-06-29 NOTE — ED Notes (Signed)
Pt noted to be eating box of chicken, drinking soda and speaking with family while in the waiting area with no difficulty. NAD. No facial droop noted, pt using both upper extremities without difficulty.

## 2017-06-29 NOTE — ED Triage Notes (Signed)
Pt reports waking up this am with numbness to right side of face. Had blurred vision when she got up this am but states vision has gotten worse and now unable to see anything out of left eye and blurred in right. Pt does not have any unilateral weakness, grips are equal.

## 2017-06-29 NOTE — ED Notes (Signed)
RN notified about BP

## 2017-06-30 ENCOUNTER — Emergency Department (HOSPITAL_COMMUNITY)
Admission: EM | Admit: 2017-06-30 | Discharge: 2017-06-30 | Discharge status: Against Medical Advice | Payer: Medicaid Other | Attending: Emergency Medicine | Admitting: Emergency Medicine

## 2017-06-30 NOTE — ED Notes (Signed)
Called patient to be roomed, no answer.

## 2017-06-30 NOTE — ED Notes (Signed)
Called for room  No answer  

## 2017-07-02 ENCOUNTER — Other Ambulatory Visit: Payer: Self-pay | Admitting: Ophthalmology

## 2017-07-02 ENCOUNTER — Other Ambulatory Visit (HOSPITAL_COMMUNITY): Payer: Self-pay | Admitting: Ophthalmology

## 2017-07-02 ENCOUNTER — Telehealth: Payer: Self-pay | Admitting: Neurology

## 2017-07-02 ENCOUNTER — Ambulatory Visit (HOSPITAL_COMMUNITY)
Admission: RE | Admit: 2017-07-02 | Discharge: 2017-07-02 | Disposition: A | Discharge status: Home / Self Care | Payer: BLUE CROSS/BLUE SHIELD | Source: Ambulatory Visit | Attending: Ophthalmology | Admitting: Ophthalmology

## 2017-07-02 DIAGNOSIS — R42 Dizziness and giddiness: Secondary | ICD-10-CM

## 2017-07-02 DIAGNOSIS — H5509 Other forms of nystagmus: Secondary | ICD-10-CM | POA: Diagnosis not present

## 2017-07-02 DIAGNOSIS — Z8673 Personal history of transient ischemic attack (TIA), and cerebral infarction without residual deficits: Secondary | ICD-10-CM | POA: Insufficient documentation

## 2017-07-02 DIAGNOSIS — H5500 Unspecified nystagmus: Secondary | ICD-10-CM

## 2017-07-02 DIAGNOSIS — R9089 Other abnormal findings on diagnostic imaging of central nervous system: Secondary | ICD-10-CM | POA: Diagnosis not present

## 2017-07-02 NOTE — Telephone Encounter (Signed)
Spoke with patient. She was fluent with her words. She stated that her symptoms started Friday. She went to [facility] ED and had a head CT, blood work, EKG. She stated that she sat in the waiting area for 7 hours and then left. She went to [facility] (?) and they repeated her CT. She also stated that her BP was 200s/100s. She stated that she was told her CT showed no significant change. She stated that her vision was getting worse so she went to the eye doctor today and he wanted her to get an MRI. The soonest they can get it is 5 pm today and she is planning to go. I advised that these symptoms are stroke-like symptoms and I advised that if they get worse or she has any new symptoms including but not limited to difficulty speaking, dizziness, severe headache, weakness on one side, etc to call 911. She verbalized understanding.   Dr. Jaynee Eagles aware and will see what her MRI shows. Plan to f/u in office.

## 2017-07-02 NOTE — Telephone Encounter (Signed)
Spoke with patient. I scheduled her to see Dr. Jaynee Eagles tomorrow 07/03/17 @ 2:00 arrival time 1:30. She verbalized understanding & appreciation.

## 2017-07-02 NOTE — Telephone Encounter (Signed)
[place] with [facility] called stating that the pt is having dizziness, right side farcical numbness, difficult finding words and has an MRI later today. [place] and pt feel that pt is needing to be seen asap hopefully today or tomorrow if not pt is wanting to know if the hospital would be the right way to go. Please call pt to discuss, if no answer leave VM pts phone is almost died but will be charging it soon.

## 2017-07-03 ENCOUNTER — Ambulatory Visit (INDEPENDENT_AMBULATORY_CARE_PROVIDER_SITE_OTHER): Payer: Medicaid Other | Admitting: Neurology

## 2017-07-03 ENCOUNTER — Encounter: Payer: Self-pay | Admitting: Neurology

## 2017-07-03 VITALS — BP 161/97 | HR 77 | Ht 62.5 in | Wt 181.0 lb

## 2017-07-03 DIAGNOSIS — I10 Essential (primary) hypertension: Secondary | ICD-10-CM | POA: Diagnosis not present

## 2017-07-03 MED ORDER — ASPIRIN EC 325 MG PO TBEC: 325.0000 mg | DELAYED_RELEASE_TABLET | Freq: Every day | ORAL | 0 refills | Status: DC

## 2017-07-03 NOTE — Progress Notes (Signed)
[facility]    Provider:  Dr Jaynee Eagles Referring Provider: Dorena Dew, FNP Primary Care Physician:  Dorena Dew, FNP  CC:  Gait abnormality  Interval history July 03, 2017: Noncompliant, she has not been taking her medications, she ran out and stopped taking insulin and blood pressure medicine. Patient is here with focal neurologic symptoms.  Repeat MRI of the brain did not show anything acute.  Patient blood pressure has been upwards of 557 systolic and she continues to have uncontrolled blood pressure and uncontrolled diabetes discussed with patient this is the likely cause of her symptoms and patient has to manage these multiple medical issues or she will likely have a stroke or other seuelae.  Reviewed compliance, discussed pathophysiology of strokes and the role of her uncontrolled vascular risk factors, she already has extensive white matter disease in the brain.    When she woke up Friday morning she didn;t feel well, the right side of her face felt numb, in the ED she was noted to be "eating a box of chicken, drinking soda in NAD". That day her vision was impaired. She went to the ED and her BP was 322/025 systolic and she left the ED. MRI did not show anything acute. She went to the eye doctor. Eyes jumping and twitching. Last HgbA1c 10.8.   On testing of the eyes patient had torsional nystagmus, none today in the office, corrected visual acuity was right 20/25 and left 20/400(was 20/150).  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  Interval History 10/24/2016: Walking is better, sensory symptoms are better. Trying to control her DM and blood pressure. Has a lot of stress in her life. She is having eye complications due to her diabetes and HTN.   HPI:  Young Brim is a 47 y.o. female here as a referral from Dr. Smith Robert for gait abnormality. PMHx uncontrolled HTN, uncontrolled diabetes. 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  MRA was performed which showed no evidence for high grade stenosis being involved in her condition. She ws drinking a lot of sodas, she was extremely thirsty, the next day she went to church and she was slurring and couldn't walk. She is feeling better. She is having problems with walking and with vision. Mostly up close vision. Right arm pain resolved with "shot in the arm" in the shoulder, she went to orthopaedics and was treated for right shoulder. No dizziness. Some confusion but overall doing really well. Still some imbalance.   MRI brain 04/30/2016:  Interval history 01/26/2016: Patient here for follow up. She had right arm pain with 2 negative EMG/NCS no etiology found except bilateral CTS. MRI of the brain showed abnormal white matter disease likely due to her uncontrolled vascular risk factors but was periventricular and concerning for demyelination, LP for other causes such as MS negative but need repeat MRI to follow especially given continued symptoms. She has uncontrolled HTN today in the office 427 systolic, uncontrolled [id number] 12-14 and eating [employer] in the clinic exam room. She has pain in the elbow, pain in the right shoulder, she still has weakness sin the right arm. She has headaches and vision changes. Vision worsening in the right eye. She got new glasses and she still can't see. She has double vision and blurry vision.   HYW:VPXTGGYIR [name] a 47 y.o.femalehere as a referral  from Dr. Dayton Martes right hand numbness and paresthesias. PMHx HTN, DM, asthma, neuropathy, cervical degenerative disease s/p acdf.No falls.Numbnees and tingling in the right arm started at 6am yesterday morning and it hasn't stopped. She has numbness in the entire right arm all the way up toe shoulder. She woke up with the pain,  the whole arm. At first she thought it was coming from the shoulder, now not sure. She does have shoulder problems, has been hurting for years and she has had surgery on it. She is pulling and yanking on it at work, heat on the shoulder didn't help. She is having weakness in the hand. She is not using the hand to drive. She has significant pain 10/10. Numb and tinlging like when her feet fall asleep. She gets sharp radiating pain in the wrist and shoot upwards. Weakness in the grip. 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 Never had any numbness or tingling in the hand in the past. It is the whole hand even the palm No numbness or weakness of legs.No headache, slurred speech, She has no history of TIA or stroke. No head injuries or falls. No anti-coagulant use.  Reviewed notes, labs and imaging from outside physicians, which showed:  Personally reviewed imaging and agree with the following:  MRI HEAD IMPRESSION:  1. No acute intracranial process. 2. Patchy T2/FLAIR hyperintense foci within the periventricular and deep white matter both cerebral 6 hemispheres, mildly advanced for patient age. These foci are somewhat nonspecific, with differential considerations including accelerated/hereditary small vessel ischemia (favored), sequela of prior trauma, hypercoagulable state, vasculitis, migraines, prior infection, or possibly underlying demyelinating disease.  MRI CERVICAL IMPRESSION:  1. No acute abnormality within the cervical spine. 2. Status post ACDF at C3-7 without complication. 3. Minimal multilevel degenerative disc disease as above. There is resultant mild bilateral foraminal narrowing at C3-4. No other significant stenosis within the cervical spine. 4. 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  She presented to the ED on 07/03/2014 and woke up with the symptoms. The paresthesias extended into the forearm.Denied  weakness. No falls or inciting factors. She was discharged after negative MRI of the brain and cervical cord. Referred to neurology OP. Glucose was in the 400s. She denies falling asleep in a position that would hurt the arm or in a way that would compress the axilla or any part of the arm. Startes she did not sleep on it at all.   Review of Systems: Patient complains of symptoms per HPI as well as the following symptoms: memory loss, confusion, numbness, headache, slurred speech, dizziness. Pertinent negatives per HPI. All others negative.   Social History   Socioeconomic History  . Marital status: Single    Spouse name: Not on file  . Number of children: 3  . Years of education: 12+  . Highest education level: Not on file  Social Needs  . Financial resource strain: Not on file  . Food insecurity - worry: Not on file  . Food insecurity - inability: Not on file  . Transportation needs - medical: Not on file  . Transportation needs - non-medical: Not on file  Occupational History  . Not on file  Tobacco Use  . Smoking status: Never Smoker  . Smokeless tobacco: Never Used  Substance and Sexual Activity  . Alcohol use: No  . Drug use: No  . Sexual activity: Not on file  Other Topics Concern  . Not  on file  Social History Narrative   Lives with son   Caffeine use: daily   Right handed    Family History  Problem Relation Age of Onset  . Vascular Disease Mother   . CAD Mother   . Heart failure Mother   . Heart disease Unknown   . Cancer Unknown        colon, stomach, lung  . Diabetes Unknown   . Seizures Unknown   . Breast cancer Sister 20    Past Medical History:  Diagnosis Date  . Asthma   . DDD (degenerative disc disease), cervical   . Diabetes mellitus   . Diabetes mellitus ([place])   . Dislocated shoulder    right  . GERD (gastroesophageal reflux disease)   . Hypertension   . Hypertension   . Neuropathy   . Stroke [facility])     Past Surgical History:    Procedure Laterality Date  . ADENOIDECTOMY    . APPENDECTOMY    . CERVICAL SPINE SURGERY  06/2012   C5-C6  . EYE SURGERY     left eye surgery   . SHOULDER SURGERY    . TONSILLECTOMY      Current Outpatient Medications  Medication Sig Dispense Refill  . albuterol (PROVENTIL HFA;VENTOLIN HFA) 108 (90 Base) MCG/ACT inhaler Inhale 1-2 puffs into the lungs every 6 (six) hours as needed for wheezing or shortness of breath. 1 Inhaler 1  . amLODipine (NORVASC) 10 MG tablet Take 1 tablet (10 mg total) by mouth daily. 90 tablet 3  . aspirin EC 81 MG tablet Take 81 mg by mouth daily.    Marland Kitchen atorvastatin (LIPITOR) 40 MG tablet Take 1 tablet (40 mg total) by mouth daily at 6 PM. 30 tablet 5  . carvedilol (COREG) 12.5 MG tablet Take 25 mg by mouth 2 (two) times daily with a meal.    . carvedilol (COREG) 25 MG tablet Take 1 tablet (25 mg total) by mouth 2 (two) times daily with a meal. Take 25 mg twice daily 180 tablet 3  . doxazosin (CARDURA) 4 MG tablet Take 1 tablet (4 mg total) by mouth daily. 30 tablet 5  . hydrochlorothiazide (HYDRODIURIL) 25 MG tablet Take 25 mg by mouth daily.    . insulin aspart (NOVOLOG) 100 UNIT/ML FlexPen Inject 10 Units into the skin 3 (three) times daily with meals. 15 mL 11  . Insulin Detemir (LEVEMIR FLEXTOUCH) 100 UNIT/ML Pen 30 units in am and 20 units pm (Patient taking differently: Inject 30 Units into the skin 2 (two) times daily. ) 15 mL 11  . spironolactone (ALDACTONE) 25 MG tablet Take 12.5 mg by mouth daily.      No current facility-administered medications for this visit.     Allergies as of 07/03/2017 - Review Complete 07/03/2017  Allergen Reaction Noted  . Lisinopril Swelling and Other (See Comments) 03/23/2016  . Penicillins Hives 03/07/2012  . Gabapentin Swelling and Other (See Comments) 07/11/2016    Vitals: Ht 5' 2.5" (1.588 m)   Wt 181 lb (82.1 kg)   BMI 32.58 kg/m  Last Weight:  Wt Readings from Last 1 Encounters:  07/03/17 181 lb (82.1  kg)   Last Height:   Ht Readings from Last 1 Encounters:  07/03/17 5' 2.5" (1.588 m)    Speech: Speech is normal; fluent and spontaneous with normal comprehension.  Cognition: The patient is oriented to person, place, and time;  Cranial Nerves:  Extraocular movements are intact. Decreased right face to  LT. ,The muscles of mastication are normal. The face is symmetric. The palate elevates in the midline. Hearing intact. Voice is normal. Shoulder shrug is normal. The tongue has normal motion without fasciculations. Could not visualize the fundi.   Coordination: Normal finger to nose and heel to shin.  Gait: Good stride, can heel-toe and tandem without imbalance.  Motor Observation: No asymmetry, no atrophy, and no involuntary movements noted. Tone: Normal muscle tone.   Posture: Posture is normal. normal erect  Strength: 5/5 strength.    Sensation: intact pin prick, temperature and vibration distally in the feet. Decreased right arm and face, intact legs. Negative Romberg.   Reflex Exam:  DTR's: Deep tendon reflexes in the upper and lower extremities are normal bilaterally, reflexes in the AJs 1+.  Toes: Right down, left equiv Clonus: Clonus is absent.   Assessment/Plan:46 y.o. female here for follow up. 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 On ophthalmology exam showed  patient had torsional nystagmus (none today in the office) corrected visual acuity was right 20/25 and left 20/400(last 20/150).    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   - Patient is  here with focal neurologic symptoms.  Repeat MRI of the brain did not show anything acute.    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 Needs close management of HTN and diabetes and will follow her clinically   Discussed: Lacunar infarcts due to uncontrolled vascular risk factors: I had a long d/w patient about her stroke, risk for recurrent stroke/TIAs, personally independently reviewed imaging studies and stroke evaluation results and answered questions. ASA 325mg  for stroke prevention, for secondary stroke prevention and maintain strict control of hypertension with blood pressure goal below 130/90, diabetes with hemoglobin A1c goal below 6.5% and lipids with LDL cholesterol goal below 70 mg/dL.I also advised the patient to eat a healthy diet with plenty of whole grains, cereals, fruits and vegetables, exercise regularly and maintain ideal body weight .   Sarina Ill, MD  [facility] [address] [place], [place] [address]  Phone [phone] Fax [phone]  A total of 15 minutes was spent face-to-face with this patient. Over half this time was spent on counseling patient on the lacunar infarct, central pontine myelinosis diagnosis and different diagnostic and therapeutic options available.

## 2017-07-04 ENCOUNTER — Telehealth: Payer: Self-pay | Admitting: Cardiovascular Disease

## 2017-07-04 NOTE — Telephone Encounter (Signed)
Patient calling,   Pt c/o BP issue: STAT if pt c/o blurred vision, one-sided weakness or slurred speech  1. What are your last 5 BP readings?   2. Are you having any other symptoms (ex. Dizziness, headache, blurred vision, passed out)?   3. What is your BP issue? Patient seen in [facility] ED on 07-02-17 patient lost vision, BP was in the triple digits. Patient saw a neurologist and was instructed to f/u with our office.

## 2017-07-04 NOTE — Telephone Encounter (Signed)
Returned call to patient.She stated she woke up last Friday 06/29/17 feeling bad unable to see.She went to ED B/P elevated.She saw eye Dr. He ordered a MRI.She also saw her neurologist.Stated she has been unable to buy new B/P medication Cardura.Stated she also ran out of carvedilol and missed 1 day.Stated she will start taking Cardura today as prescribed.Medications reviewed with patient and advised to take all as prescribed.Appointment scheduled with Jory Sims DNP 07/09/17 at 2:00 pm.Advised to monitor B/P and bring B/P readings and all medications to appointment.

## 2017-07-09 ENCOUNTER — Ambulatory Visit (INDEPENDENT_AMBULATORY_CARE_PROVIDER_SITE_OTHER): Payer: BLUE CROSS/BLUE SHIELD | Admitting: Adult Health

## 2017-07-09 ENCOUNTER — Encounter: Payer: Self-pay | Admitting: Adult Health

## 2017-07-09 ENCOUNTER — Other Ambulatory Visit (HOSPITAL_COMMUNITY): Payer: Self-pay | Admitting: Ophthalmology

## 2017-07-09 VITALS — Ht 62.5 in | Wt 187.2 lb

## 2017-07-09 DIAGNOSIS — Z79899 Other long term (current) drug therapy: Secondary | ICD-10-CM | POA: Diagnosis not present

## 2017-07-09 DIAGNOSIS — I639 Cerebral infarction, unspecified: Secondary | ICD-10-CM | POA: Diagnosis not present

## 2017-07-09 DIAGNOSIS — E7800 Pure hypercholesterolemia, unspecified: Secondary | ICD-10-CM | POA: Diagnosis not present

## 2017-07-09 DIAGNOSIS — I10 Essential (primary) hypertension: Secondary | ICD-10-CM

## 2017-07-09 DIAGNOSIS — R42 Dizziness and giddiness: Secondary | ICD-10-CM

## 2017-07-09 DIAGNOSIS — H5500 Unspecified nystagmus: Secondary | ICD-10-CM

## 2017-07-09 MED ORDER — DOXAZOSIN MESYLATE 2 MG PO TABS: 2.0000 mg | ORAL_TABLET | Freq: Every day | ORAL | 2 refills | Status: DC

## 2017-07-09 NOTE — Progress Notes (Signed)
Cardiology Office Note   Date:  07/09/2017   ID:  Christina Orozco, DOB December 04, 1970, MRN [id number]  PCP:  Dorena Dew, FNP  Cardiologist: Dr. Oval Linsey Chief Complaint  Patient presents with  . Hypertension  . Dizziness     History of Present Illness: Christina Orozco is a 47 y.o. female who presents for ongoing assessment and management of poorly controlled hypertension, history of severe LVH, with other history to include diabetes, CVA, and asthma.  The patient was last seen in the office by Dr. Oval Linsey on 05/15/2017.    At that time the patient blood pressure remained poorly controlled.  Doxazosin 4 mg nightly was added to her medication regimen amlodipine was increased to 10 mg daily, she was continue carvedilol, HCTZ, spironolactone, and amlodipine.  The patient is allergic to ACE inhibitors and ARB secondary to angioedema.  Patient comes today feeling dizzy.  She states she has been taking her medications as directed.  She has been out of work, however, for over a week.  She was seen in the emergency room on the 19th blood pressure, blurred vision, and right-sided facial numbness.  Review of her ER visit patient's blood pressure 178/76.  The patient denies medical noncompliance when she was seen in the ER.  She ended up leaving after approximately 6 hours because she had not been seen by a physician.  The family member took her to [place].  She remained hypertensive, they told her that she need to follow-up with her neurologist. Is given a Xanax.  She did follow up with ophthalmologist, who ordered an MRI as she was having vision disturbances.  The patient admitted to not taking Cardura as she had not been able to afford it at the time.  She is now taking it.  MRI was found to be negative for acute intracranial abnormality.  Was found to have chronic small vessel ischemic changes mildly progressed since 2018.  Prior subcentimeter remote lacunar infarcts within the left caudate  and right thalamus.  Patient is since started taking doxazosin 4 mg in addition to all of her other medications as directed.  She comes today feeling very dizzy and lightheaded.  Static vital signs were completed.  Lying 119/75 heart rate 83, sitting blood pressure 101/70 heart rate 86, standing blood pressure 86/82 with a heart rate of 86.  Patient was symptomatic and unable to stand for the full 3 minutes.  On questioning, the patient is having some issues with blurred vision here today.  She no longer has any right-sided numbness or tingling.  The patient becomes emotional when discussing her symptoms.  She often has to stop, and breathe for a little while before continuing.  The patient states that she takes her blood pressures at home with a wrist cuff and they continue to be elevated between 374 and 827 systolic.  The patient is not been work since 19 January.  Past Medical History:  Diagnosis Date  . Asthma   . DDD (degenerative disc disease), cervical   . Diabetes mellitus   . Diabetes mellitus ([place])   . Dislocated shoulder    right  . GERD (gastroesophageal reflux disease)   . Hypertension   . Hypertension   . Neuropathy   . Stroke [facility])     Past Surgical History:  Procedure Laterality Date  . ADENOIDECTOMY    . APPENDECTOMY    . CERVICAL SPINE SURGERY  06/2012   C5-C6  . EYE SURGERY     left eye  surgery   . SHOULDER SURGERY    . TONSILLECTOMY       Current Outpatient Medications  Medication Sig Dispense Refill  . albuterol (PROVENTIL HFA;VENTOLIN HFA) 108 (90 Base) MCG/ACT inhaler Inhale 1-2 puffs into the lungs every 6 (six) hours as needed for wheezing or shortness of breath. 1 Inhaler 1  . amLODipine (NORVASC) 10 MG tablet Take 1 tablet (10 mg total) by mouth daily. 90 tablet 3  . aspirin EC 325 MG tablet Take 1 tablet (325 mg total) by mouth daily. 30 tablet 0  . atorvastatin (LIPITOR) 40 MG tablet Take 1 tablet (40 mg total) by mouth daily at 6 PM. 30 tablet 5    . carvedilol (COREG) 12.5 MG tablet Take 25 mg by mouth 2 (two) times daily with a meal.    . carvedilol (COREG) 25 MG tablet Take 1 tablet (25 mg total) by mouth 2 (two) times daily with a meal. Take 25 mg twice daily 180 tablet 3  . doxazosin (CARDURA) 4 MG tablet Take 1 tablet (4 mg total) by mouth daily. 30 tablet 5  . hydrochlorothiazide (HYDRODIURIL) 25 MG tablet Take 25 mg by mouth daily.    . insulin aspart (NOVOLOG) 100 UNIT/ML FlexPen Inject 10 Units into the skin 3 (three) times daily with meals. 15 mL 11  . Insulin Detemir (LEVEMIR FLEXTOUCH) 100 UNIT/ML Pen 30 units in am and 20 units pm (Patient taking differently: Inject 30 Units into the skin 2 (two) times daily. ) 15 mL 11  . spironolactone (ALDACTONE) 25 MG tablet Take 12.5 mg by mouth daily.      No current facility-administered medications for this visit.     Allergies:   Lisinopril; Penicillins; and Gabapentin    Social History:  The patient  reports that  has never smoked. she has never used smokeless tobacco. She reports that she does not drink alcohol or use drugs.   Family History:  The patient's family history includes Breast cancer (age of onset: 31) in her sister; CAD in her mother; Cancer in her unknown relative; Diabetes in her unknown relative; Heart disease in her unknown relative; Heart failure in her mother; Seizures in her unknown relative; Vascular Disease in her mother.    ROS: All other systems are reviewed and negative. Unless otherwise mentioned in H&P    PHYSICAL EXAM: VS:  Ht 5' 2.5" (1.588 m)   Wt 187 lb 3.2 oz (84.9 kg)   BMI 33.69 kg/m  , BMI Body mass index is 33.69 kg/m. GEN: Well nourished, well developed, in no acute distress  HEENT: normal  Neck: no JVD, carotid bruits, or masses Cardiac: RRR; no murmurs, rubs, or gallops,no edema  Respiratory:  clear to auscultation bilaterally, normal work of breathing GI: soft, nontender, nondistended, + BS MS: no deformity or atrophy  Skin:  warm and dry, no rash Neuro:  Strength and sensation are intact Psych:  labile affect ranging from emotional to flat.    Recent Labs: 11/21/2016: TSH 0.50 06/29/2017: ALT 13; BUN 27; Creatinine, Ser 1.50; Hemoglobin 11.2; Platelets 275; Potassium 3.7; Sodium 139    Lipid Panel    Component Value Date/Time   CHOL 143 05/02/2016 0605   TRIG 179 (H) 05/02/2016 0605   HDL 47 05/02/2016 0605   CHOLHDL 3.0 05/02/2016 0605   VLDL 36 05/02/2016 0605   LDLCALC 60 05/02/2016 0605      Wt Readings from Last 3 Encounters:  07/09/17 187 lb 3.2 oz (84.9 kg)  07/03/17 181 lb (82.1 kg)  05/15/17 189 lb (85.7 kg)      Other studies Reviewed: Echocardiogram December 24, 2016  - Left ventricle: The cavity size was normal. Wall thickness was   increased in a pattern of severe LVH. There was focal basal   hypertrophy. Systolic function was vigorous. The estimated   ejection fraction was in the range of 65% to 70%. Wall motion was   normal; there were no regional wall motion abnormalities. Doppler   parameters are consistent with abnormal left ventricular   relaxation (grade 1 diastolic dysfunction).  ASSESSMENT AND PLAN:  1.  Difficult to control hypertension: Previous documentation noted that her blood pressures have been elevated greater than 712 458 systolic.  She was recently seen in the emergency room on 06/30/2017 with complaints of right-sided tingling and weakness and hypertension.  She had not been taking doxazosin at that time.  She is now taking medications as directed and is here today hypotensive and dizzy.  I have advised her to decrease her doxazosin to 2 mg daily.  She is not to take it tomorrow as she is so hypotensive today, and begin taking the lower dose on Wednesday, July 11, 2017.  She is to get a accurate blood pressure cuff, which would read at her brachial area instead of her wrist for more accuracy.  I will repeat a BMET to evaluate for dehydration or hyperkalemia in the  setting of diuretic use, and spironolactone.  Patient states she is thirsty all the time and I have advised her to drink extra fluid today since she is hypotensive.  She has a follow-up with Dr. Oval Linsey on July 19, 2017.  At which time further medication regimen adjustments may be necessary.  Because she is feeling so badly she is requesting a letter which allows her to be out of work until Wednesday, July 11, 2017.  This is provided.  She will return to work for half day and then begin working full-time thereafter if her blood pressure is normalized and she feels less dizzy.  I have concerns about her driving due to hypotension and dizziness.  I have advised her if her blood pressure is low and she becomes dizzy she is not to drive at all.  2.  History of CVA: Continues to have labile affect, concerning emotional states.  Most recent MRI did not demonstrate new areas of ischemia.  3.  Diabetes: Checking hemoglobin A1c with blood draw as she states her blood glucoses have been running in the 200s at home.  4.  Hypercholesterolemia: Continue statin therapy.  Current medicines are reviewed at length with the patient today.    Labs/ tests ordered today include: BMET, hemoglobin A1c Phill Myron. West Pugh, ANP, AACC   07/09/2017 1:59 PM    [facility] [address] [place], [place] [address] Phone: [phone]; Fax: [phone]

## 2017-07-09 NOTE — Patient Instructions (Signed)
Medication Instructions:  DECREASE DOXAZOSIN 2MG  DAILY.  If you need a refill on your cardiac medications before your next appointment, please call your pharmacy.  Labs: BMET AND CBC TODAY HERE IN OUR LAB  Follow-Up: Your physician wants you to follow-up: KEEP SCHEDULED APPT WITH DR [place].   Thank you for choosing [facility] at [facility]!!

## 2017-07-10 LAB — BASIC METABOLIC PANEL
BUN/Creatinine Ratio: 13 (ref 9–23)
BUN: 29 mg/dL — AB (ref 6–24)
CO2: 22 mmol/L (ref 20–29)
CREATININE: 2.18 mg/dL — AB (ref 0.57–1.00)
Calcium: 9.6 mg/dL (ref 8.7–10.2)
Chloride: 99 mmol/L (ref 96–106)
GFR calc Af Amer: 31 mL/min/{1.73_m2} — ABNORMAL LOW (ref >59)
GFR, EST NON AFRICAN AMERICAN: 27 mL/min/{1.73_m2} — AB (ref >59)
GLUCOSE: 228 mg/dL — AB (ref 65–99)
Potassium: 4.6 mmol/L (ref 3.5–5.2)
Sodium: 138 mmol/L (ref 134–144)

## 2017-07-10 LAB — CBC
HEMATOCRIT: 31.7 % — AB (ref 34.0–46.6)
Hemoglobin: 10.6 g/dL — ABNORMAL LOW (ref 11.1–15.9)
MCH: 28.7 pg (ref 26.6–33.0)
MCHC: 33.4 g/dL (ref 31.5–35.7)
MCV: 86 fL (ref 79–97)
Platelets: 291 10*3/uL (ref 150–379)
RBC: 3.69 x10E6/uL — AB (ref 3.77–5.28)
RDW: 13.6 % (ref 12.3–15.4)
WBC: 6.8 10*3/uL (ref 3.4–10.8)

## 2017-07-19 ENCOUNTER — Encounter: Payer: Self-pay | Admitting: Cardiovascular Disease

## 2017-07-19 ENCOUNTER — Encounter: Payer: Self-pay | Admitting: *Deleted

## 2017-07-19 ENCOUNTER — Ambulatory Visit (INDEPENDENT_AMBULATORY_CARE_PROVIDER_SITE_OTHER): Payer: BLUE CROSS/BLUE SHIELD | Admitting: Cardiovascular Disease

## 2017-07-19 VITALS — BP 120/72 | HR 76 | Ht 63.5 in | Wt 188.0 lb

## 2017-07-19 DIAGNOSIS — N179 Acute kidney failure, unspecified: Secondary | ICD-10-CM | POA: Diagnosis not present

## 2017-07-19 DIAGNOSIS — I119 Hypertensive heart disease without heart failure: Secondary | ICD-10-CM

## 2017-07-19 DIAGNOSIS — R600 Localized edema: Secondary | ICD-10-CM

## 2017-07-19 DIAGNOSIS — N189 Chronic kidney disease, unspecified: Secondary | ICD-10-CM

## 2017-07-19 LAB — BASIC METABOLIC PANEL
BUN / CREAT RATIO: 22 (ref 9–23)
BUN: 44 mg/dL — ABNORMAL HIGH (ref 6–24)
CALCIUM: 9.9 mg/dL (ref 8.7–10.2)
CO2: 22 mmol/L (ref 20–29)
Chloride: 103 mmol/L (ref 96–106)
Creatinine, Ser: 1.97 mg/dL — ABNORMAL HIGH (ref 0.57–1.00)
GFR, EST AFRICAN AMERICAN: 35 mL/min/{1.73_m2} — AB (ref >59)
GFR, EST NON AFRICAN AMERICAN: 30 mL/min/{1.73_m2} — AB (ref >59)
Glucose: 166 mg/dL — ABNORMAL HIGH (ref 65–99)
POTASSIUM: 4.2 mmol/L (ref 3.5–5.2)
Sodium: 138 mmol/L (ref 134–144)

## 2017-07-19 MED ORDER — AMLODIPINE BESYLATE 5 MG PO TABS: 5.0000 mg | ORAL_TABLET | Freq: Every day | ORAL | 3 refills | Status: DC

## 2017-07-19 MED ORDER — DOXAZOSIN MESYLATE 4 MG PO TABS: 4.0000 mg | ORAL_TABLET | Freq: Every day | ORAL | 1 refills | Status: DC

## 2017-07-19 NOTE — Progress Notes (Signed)
Cardiology Office Note   Date:  07/19/2017   ID:  Christina Orozco, DOB Nov 11, 1970, MRN [id number]  PCP:  Dorena Dew, FNP  Cardiologist:   Skeet Latch, MD   No chief complaint on file.    History of Present Illness: Christina Orozco is a 47 y.o. female with poorly-controlled hypertension, severe LVH, diabetes, prior stroke, and asthma who presents for follow-up. She was initially seen 12/2016 for an evaluation of hypertension and LVH.  She had an echo 04/2016 that revealed LVEF 65-70% and mild basal septum hypertrophy.  She had a repeat echo 12/01/16 that showed LVEF 65-70% and severe LVH.  She reports that she was initially diagnosed with hypertension in high school.  The only itme it was well-controlled was when she was pregnant. She has several family members with hypertension.  Her mother had a heart attack at age 52 and several other family members had premature CAD.  At her initial appointment carvedilol was increased and amlodipine was added to her regimen.  At her last appointment Christina Orozco's blood pressure remained poorly-controlled.   Amlodipine was increased and she was started on doxazosin.  She was seen in the emergency department 06/2017 for hypertension.  At the time she had not yet filled doxazosin due to cost.  She saw Jory Sims, MD-NP, on 07/09/17.  At that time she reported dizziness and was orthostatic.  Doxazosin was decreased to 2 mg daily.  She had labs that showed acute renal failure.  Her creatinine was up to 2.18.  HCTZ was reduced to 12.5 mg daily.  Since making that change she has been feeling much better.  Her blood pressure has been well-controlled.  She is no longer dizzy and her vision has improved.  Her main complaint is lower extremity edema.  Her legs swell up at the end of the day.  It improves somewhat with elevation.  She has tried wearing compression stockings without improvement.  She has been walking for exercise at least 30 minutes/day.   She has no chest pain or shortness of breath with this activity.  She sometimes feels short of breath when she is trying to talk and very rarely when she lies down.  She uses her inhaler and this helps the symptoms.   Past Medical History:  Diagnosis Date  . Asthma   . DDD (degenerative disc disease), cervical   . Diabetes mellitus   . Diabetes mellitus ([place])   . Dislocated shoulder    right  . GERD (gastroesophageal reflux disease)   . Hypertension   . Hypertension   . Neuropathy   . Stroke [facility])     Past Surgical History:  Procedure Laterality Date  . ADENOIDECTOMY    . APPENDECTOMY    . CERVICAL SPINE SURGERY  06/2012   C5-C6  . EYE SURGERY     left eye surgery   . SHOULDER SURGERY    . TONSILLECTOMY       Current Outpatient Medications  Medication Sig Dispense Refill  . albuterol (PROVENTIL HFA;VENTOLIN HFA) 108 (90 Base) MCG/ACT inhaler Inhale 1-2 puffs into the lungs every 6 (six) hours as needed for wheezing or shortness of breath. 1 Inhaler 1  . amLODipine (NORVASC) 5 MG tablet Take 1 tablet (5 mg total) by mouth daily. 90 tablet 3  . aspirin EC 325 MG tablet Take 1 tablet (325 mg total) by mouth daily. 30 tablet 0  . atorvastatin (LIPITOR) 40 MG tablet Take 1 tablet (40 mg total)  by mouth daily at 6 PM. 30 tablet 5  . carvedilol (COREG) 25 MG tablet Take 1 tablet (25 mg total) by mouth 2 (two) times daily with a meal. Take 25 mg twice daily 180 tablet 3  . doxazosin (CARDURA) 4 MG tablet Take 1 tablet (4 mg total) by mouth daily. 90 tablet 1  . hydrochlorothiazide (HYDRODIURIL) 12.5 MG tablet Take 12.5 mg by mouth daily.    . insulin aspart (NOVOLOG) 100 UNIT/ML FlexPen Inject 10 Units into the skin 3 (three) times daily with meals. 15 mL 11  . Insulin Detemir (LEVEMIR FLEXTOUCH) 100 UNIT/ML Pen 30 units in am and 20 units pm (Patient taking differently: Inject 30 Units into the skin 2 (two) times daily. ) 15 mL 11  . spironolactone (ALDACTONE) 25 MG tablet Take  12.5 mg by mouth daily.      No current facility-administered medications for this visit.     Allergies:   Lisinopril; Penicillins; and Gabapentin    Social History:  The patient  reports that  has never smoked. she has never used smokeless tobacco. She reports that she does not drink alcohol or use drugs.   Family History:  The patient's family history includes Breast cancer (age of onset: 63) in her sister; CAD in her mother; Cancer in her unknown relative; Diabetes in her unknown relative; Heart disease in her unknown relative; Heart failure in her mother; Seizures in her unknown relative; Vascular Disease in her mother.    ROS:  Please see the history of present illness.   Otherwise, review of systems are positive for none.   All other systems are reviewed and negative.    PHYSICAL EXAM: VS:  BP 120/72   Pulse 76   Ht 5' 3.5" (1.613 m)   Wt 188 lb (85.3 kg)   SpO2 99%   BMI 32.78 kg/m  , BMI Body mass index is 32.78 kg/m. GENERAL:  Well appearing HEENT: Pupils equal round and reactive, fundi not visualized, oral mucosa unremarkable NECK:  No jugular venous distention, waveform within normal limits, carotid upstroke brisk and symmetric, no bruits, no thyromegaly LUNGS:  Clear to auscultation bilaterally HEART:  RRR.  PMI not displaced or sustained,S1 and S2 within normal limits, no S3, no S4, no clicks, no rubs, no murmurs ABD:  Flat, positive bowel sounds normal in frequency in pitch, no bruits, no rebound, no guarding, no midline pulsatile mass, no hepatomegaly, no splenomegaly EXT:  2 plus pulses throughout, no edema, no cyanosis no clubbing SKIN:  No rashes no nodules NEURO:  Cranial nerves II through XII grossly intact, motor grossly intact throughout PSYCH:  Cognitively intact, oriented to person place and time   EKG:  EKG is not ordered today. The ekg ordered 12/14/16 demonstrates sinus rhythm.  Rate 75 bpm.  Cannot rule out prior septal infarct.    Recent  Labs: 11/21/2016: TSH 0.50 06/29/2017: ALT 13 07/09/2017: BUN 29; Creatinine, Ser 2.18; Hemoglobin 10.6; Platelets 291; Potassium 4.6; Sodium 138    Lipid Panel    Component Value Date/Time   CHOL 143 05/02/2016 0605   TRIG 179 (H) 05/02/2016 0605   HDL 47 05/02/2016 0605   CHOLHDL 3.0 05/02/2016 0605   VLDL 36 05/02/2016 0605   LDLCALC 60 05/02/2016 0605      Wt Readings from Last 3 Encounters:  07/19/17 188 lb (85.3 kg)  07/09/17 187 lb 3.2 oz (84.9 kg)  07/03/17 181 lb (82.1 kg)      ASSESSMENT AND PLAN:  #  Hypertensive heart disease:  Blood pressure is much better controlled.  She is reporting edema which may be at least partially attributed to her amlodipine.  No edema is present on exam today.  We will reduce the amlodipine back to 5 mg daily and increase Cardura to 4 mg.  Continue carvedilol, spironolactone and hydrochlorthiazide.  # Acute on chronic renal failure: Renal function was elevated at her last appointment.  We will repeat a BMP today noted HCTZ has been reduced.  She notes that she is scheduled to see a nephrologist for the first time later this month.  # LE Edema: Not present on exam today.  Reduce amlodipine to 5mg  as above.  There is no evidence of heart failure on exam or on her echo.   # CV Disease prevention: LDL 60 04/2016. Continue atorvastatin and aspirin.    Current medicines are reviewed at length with the patient today.  The patient does not have concerns regarding medicines.  The following changes have been made:    Labs/ tests ordered today include:   Orders Placed This Encounter  Procedures  . Basic metabolic panel  . Ambulatory referral to Vascular Surgery     Disposition:   FU with Soren Pigman C. Oval Linsey, MD, [facility] in 2 months.    This note was written with the assistance of speech recognition software.  Please excuse any transcriptional errors.  Signed, Dalonda Simoni C. Oval Linsey, MD, [facility]  07/19/2017 9:33 AM    [place]

## 2017-07-19 NOTE — Patient Instructions (Addendum)
Medication Instructions:  DECREASE YOUR AMLODIPINE TO 5 MG DAILY   INCREASE YOUR DOXAZOSIN TO 4 MG DAILY  Labwork: BMET TODAY    Testing/Procedures: NONE  Follow-Up: Your physician recommends that you schedule a follow-up appointment in: AROUND 2 MONTHS  You have been referred to [place]. IF YOU DO NOT HEAR FROM THE OFFICE YOU MAY CALL THEM DIRECTLY AT [phone]  If you need a refill on your cardiac medications before your next appointment, please call your pharmacy.

## 2017-07-20 ENCOUNTER — Telehealth: Payer: Self-pay | Admitting: *Deleted

## 2017-07-20 NOTE — Telephone Encounter (Signed)
Advised patient of lab results and medication changes  °

## 2017-07-20 NOTE — Telephone Encounter (Signed)
-----   Message from Skeet Latch, MD sent at 07/20/2017 10:41 AM EST ----- Kidney function is getting better.  Let's stop spironolactone to see if that helps her kidney function even more.  Keep tracking BP and call if consistently >140/90.

## 2017-07-25 ENCOUNTER — Emergency Department (HOSPITAL_COMMUNITY): Payer: BLUE CROSS/BLUE SHIELD

## 2017-07-25 ENCOUNTER — Inpatient Hospital Stay (HOSPITAL_COMMUNITY)
Admission: EM | Admit: 2017-07-25 | Discharge: 2017-07-27 | DRG: 066 | Disposition: A | Discharge status: Home Health | Payer: BLUE CROSS/BLUE SHIELD | Attending: Internal Medicine | Admitting: Internal Medicine

## 2017-07-25 ENCOUNTER — Telehealth: Payer: Self-pay | Admitting: Cardiovascular Disease

## 2017-07-25 ENCOUNTER — Encounter (HOSPITAL_COMMUNITY): Payer: Self-pay | Admitting: Emergency Medicine

## 2017-07-25 DIAGNOSIS — G629 Polyneuropathy, unspecified: Secondary | ICD-10-CM | POA: Diagnosis present

## 2017-07-25 DIAGNOSIS — Z79899 Other long term (current) drug therapy: Secondary | ICD-10-CM | POA: Diagnosis not present

## 2017-07-25 DIAGNOSIS — J45909 Unspecified asthma, uncomplicated: Secondary | ICD-10-CM | POA: Diagnosis present

## 2017-07-25 DIAGNOSIS — D638 Anemia in other chronic diseases classified elsewhere: Secondary | ICD-10-CM | POA: Diagnosis present

## 2017-07-25 DIAGNOSIS — Z7982 Long term (current) use of aspirin: Secondary | ICD-10-CM

## 2017-07-25 DIAGNOSIS — Z880 Allergy status to penicillin: Secondary | ICD-10-CM

## 2017-07-25 DIAGNOSIS — E785 Hyperlipidemia, unspecified: Secondary | ICD-10-CM | POA: Diagnosis present

## 2017-07-25 DIAGNOSIS — E1151 Type 2 diabetes mellitus with diabetic peripheral angiopathy without gangrene: Secondary | ICD-10-CM | POA: Diagnosis present

## 2017-07-25 DIAGNOSIS — E118 Type 2 diabetes mellitus with unspecified complications: Secondary | ICD-10-CM

## 2017-07-25 DIAGNOSIS — R42 Dizziness and giddiness: Secondary | ICD-10-CM

## 2017-07-25 DIAGNOSIS — R29700 NIHSS score 0: Secondary | ICD-10-CM | POA: Diagnosis present

## 2017-07-25 DIAGNOSIS — E1165 Type 2 diabetes mellitus with hyperglycemia: Secondary | ICD-10-CM | POA: Diagnosis present

## 2017-07-25 DIAGNOSIS — R270 Ataxia, unspecified: Secondary | ICD-10-CM

## 2017-07-25 DIAGNOSIS — Z888 Allergy status to other drugs, medicaments and biological substances status: Secondary | ICD-10-CM

## 2017-07-25 DIAGNOSIS — Z794 Long term (current) use of insulin: Secondary | ICD-10-CM | POA: Diagnosis not present

## 2017-07-25 DIAGNOSIS — E669 Obesity, unspecified: Secondary | ICD-10-CM | POA: Diagnosis present

## 2017-07-25 DIAGNOSIS — M5030 Other cervical disc degeneration, unspecified cervical region: Secondary | ICD-10-CM | POA: Diagnosis present

## 2017-07-25 DIAGNOSIS — R262 Difficulty in walking, not elsewhere classified: Secondary | ICD-10-CM | POA: Diagnosis present

## 2017-07-25 DIAGNOSIS — Z9641 Presence of insulin pump (external) (internal): Secondary | ICD-10-CM | POA: Diagnosis present

## 2017-07-25 DIAGNOSIS — E1140 Type 2 diabetes mellitus with diabetic neuropathy, unspecified: Secondary | ICD-10-CM | POA: Diagnosis present

## 2017-07-25 DIAGNOSIS — I639 Cerebral infarction, unspecified: Secondary | ICD-10-CM | POA: Diagnosis present

## 2017-07-25 DIAGNOSIS — I6302 Cerebral infarction due to thrombosis of basilar artery: Secondary | ICD-10-CM | POA: Diagnosis not present

## 2017-07-25 DIAGNOSIS — I119 Hypertensive heart disease without heart failure: Secondary | ICD-10-CM | POA: Diagnosis present

## 2017-07-25 DIAGNOSIS — I10 Essential (primary) hypertension: Secondary | ICD-10-CM | POA: Diagnosis present

## 2017-07-25 DIAGNOSIS — I340 Nonrheumatic mitral (valve) insufficiency: Secondary | ICD-10-CM | POA: Diagnosis not present

## 2017-07-25 DIAGNOSIS — R471 Dysarthria and anarthria: Secondary | ICD-10-CM | POA: Diagnosis present

## 2017-07-25 DIAGNOSIS — Z6833 Body mass index (BMI) 33.0-33.9, adult: Secondary | ICD-10-CM | POA: Diagnosis not present

## 2017-07-25 DIAGNOSIS — I6381 Other cerebral infarction due to occlusion or stenosis of small artery: Secondary | ICD-10-CM | POA: Diagnosis present

## 2017-07-25 DIAGNOSIS — E1169 Type 2 diabetes mellitus with other specified complication: Secondary | ICD-10-CM | POA: Diagnosis present

## 2017-07-25 LAB — CBC
HEMATOCRIT: 29.6 % — AB (ref 36.0–46.0)
HEMOGLOBIN: 10.2 g/dL — AB (ref 12.0–15.0)
MCH: 28.8 pg (ref 26.0–34.0)
MCHC: 34.5 g/dL (ref 30.0–36.0)
MCV: 83.6 fL (ref 78.0–100.0)
Platelets: 260 10*3/uL (ref 150–400)
RBC: 3.54 MIL/uL — AB (ref 3.87–5.11)
RDW: 12.6 % (ref 11.5–15.5)
WBC: 10.1 10*3/uL (ref 4.0–10.5)

## 2017-07-25 LAB — URINALYSIS, ROUTINE W REFLEX MICROSCOPIC
BACTERIA UA: NONE SEEN
BILIRUBIN URINE: NEGATIVE
Ketones, ur: NEGATIVE mg/dL
LEUKOCYTES UA: NEGATIVE
NITRITE: NEGATIVE
PROTEIN: 30 mg/dL — AB
Specific Gravity, Urine: 1.014 (ref 1.005–1.030)
pH: 5 (ref 5.0–8.0)

## 2017-07-25 LAB — I-STAT BETA HCG BLOOD, ED (MC, WL, AP ONLY): I-stat hCG, quantitative: <5 m[IU]/mL (ref <5)

## 2017-07-25 LAB — BASIC METABOLIC PANEL
ANION GAP: 10 (ref 5–15)
BUN: 34 mg/dL — ABNORMAL HIGH (ref 6–20)
CALCIUM: 9.1 mg/dL (ref 8.9–10.3)
CO2: 22 mmol/L (ref 22–32)
Chloride: 107 mmol/L (ref 101–111)
Creatinine, Ser: 1.8 mg/dL — ABNORMAL HIGH (ref 0.44–1.00)
GFR calc non Af Amer: 33 mL/min — ABNORMAL LOW (ref >60)
GFR, EST AFRICAN AMERICAN: 38 mL/min — AB (ref >60)
GLUCOSE: 345 mg/dL — AB (ref 65–99)
POTASSIUM: 4.1 mmol/L (ref 3.5–5.1)
SODIUM: 139 mmol/L (ref 135–145)

## 2017-07-25 LAB — CBG MONITORING, ED: GLUCOSE-CAPILLARY: 295 mg/dL — AB (ref 65–99)

## 2017-07-25 MED ORDER — INSULIN DETEMIR 100 UNIT/ML ~~LOC~~ SOLN
30.0000 [IU] | Freq: Two times a day (BID) | SUBCUTANEOUS | Status: DC
  Administered 2017-07-26 – 2017-07-27 (×4): 30 [IU] via SUBCUTANEOUS
  Filled 2017-07-25 (×4): qty 0.3

## 2017-07-25 MED ORDER — AMLODIPINE BESYLATE 5 MG PO TABS
5.0000 mg | ORAL_TABLET | Freq: Every day | ORAL | Status: DC
  Administered 2017-07-26 – 2017-07-27 (×2): 5 mg via ORAL
  Filled 2017-07-25 (×2): qty 1

## 2017-07-25 MED ORDER — LORAZEPAM 2 MG/ML IJ SOLN
1.0000 mg | Freq: Once | INTRAMUSCULAR | Status: AC
  Administered 2017-07-25: 1 mg via INTRAVENOUS
  Filled 2017-07-25: qty 1

## 2017-07-25 MED ORDER — CARVEDILOL 25 MG PO TABS
25.0000 mg | ORAL_TABLET | Freq: Two times a day (BID) | ORAL | Status: DC
  Administered 2017-07-26 – 2017-07-27 (×3): 25 mg via ORAL
  Filled 2017-07-25 (×2): qty 1
  Filled 2017-07-25: qty 2
  Filled 2017-07-25 (×2): qty 1
  Filled 2017-07-25 (×2): qty 2

## 2017-07-25 MED ORDER — ACETAMINOPHEN 325 MG PO TABS: 650.0000 mg | ORAL_TABLET | ORAL | Status: DC | PRN

## 2017-07-25 MED ORDER — ATORVASTATIN CALCIUM 40 MG PO TABS
40.0000 mg | ORAL_TABLET | Freq: Every day | ORAL | Status: DC
  Administered 2017-07-26: 40 mg via ORAL
  Filled 2017-07-25: qty 1

## 2017-07-25 MED ORDER — SODIUM CHLORIDE 0.9 % IV SOLN
INTRAVENOUS | Status: DC
  Administered 2017-07-26 (×3): via INTRAVENOUS

## 2017-07-25 MED ORDER — ALBUTEROL SULFATE (2.5 MG/3ML) 0.083% IN NEBU
2.5000 mg | INHALATION_SOLUTION | Freq: Four times a day (QID) | RESPIRATORY_TRACT | Status: DC | PRN
Start: 2017-07-25 — End: 2017-07-27

## 2017-07-25 MED ORDER — SODIUM CHLORIDE 0.9 % IV BOLUS (SEPSIS)
1000.0000 mL | Freq: Once | INTRAVENOUS | Status: AC
  Administered 2017-07-25: 1000 mL via INTRAVENOUS

## 2017-07-25 MED ORDER — ALBUTEROL SULFATE HFA 108 (90 BASE) MCG/ACT IN AERS: 1.0000 | INHALATION_SPRAY | Freq: Four times a day (QID) | RESPIRATORY_TRACT | Status: DC | PRN

## 2017-07-25 MED ORDER — INSULIN ASPART 100 UNIT/ML ~~LOC~~ SOLN
0.0000 [IU] | Freq: Three times a day (TID) | SUBCUTANEOUS | Status: DC
Start: 2017-07-26 — End: 2017-07-27
  Administered 2017-07-26 (×2): 3 [IU] via SUBCUTANEOUS

## 2017-07-25 MED ORDER — ENOXAPARIN SODIUM 40 MG/0.4ML ~~LOC~~ SOLN
40.0000 mg | Freq: Every day | SUBCUTANEOUS | Status: DC
  Administered 2017-07-26 – 2017-07-27 (×2): 40 mg via SUBCUTANEOUS
  Filled 2017-07-25 (×2): qty 0.4

## 2017-07-25 MED ORDER — INSULIN ASPART 100 UNIT/ML ~~LOC~~ SOLN
0.0000 [IU] | Freq: Every day | SUBCUTANEOUS | Status: DC
  Administered 2017-07-26 (×2): 2 [IU] via SUBCUTANEOUS
  Filled 2017-07-25: qty 1

## 2017-07-25 MED ORDER — STROKE: EARLY STAGES OF RECOVERY BOOK
Freq: Once | Status: AC
  Administered 2017-07-26: 06:00:00
  Filled 2017-07-25: qty 1

## 2017-07-25 MED ORDER — MECLIZINE HCL 25 MG PO TABS
50.0000 mg | ORAL_TABLET | Freq: Once | ORAL | Status: AC
  Administered 2017-07-25: 50 mg via ORAL
  Filled 2017-07-25: qty 2

## 2017-07-25 MED ORDER — SENNOSIDES-DOCUSATE SODIUM 8.6-50 MG PO TABS: 1.0000 | ORAL_TABLET | Freq: Every evening | ORAL | Status: DC | PRN

## 2017-07-25 MED ORDER — ACETAMINOPHEN 650 MG RE SUPP: 650.0000 mg | RECTAL | Status: DC | PRN

## 2017-07-25 MED ORDER — DOXAZOSIN MESYLATE 8 MG PO TABS
4.0000 mg | ORAL_TABLET | Freq: Every day | ORAL | Status: DC
  Administered 2017-07-26 – 2017-07-27 (×2): 4 mg via ORAL
  Filled 2017-07-25 (×2): qty 1

## 2017-07-25 MED ORDER — ASPIRIN EC 325 MG PO TBEC
325.0000 mg | DELAYED_RELEASE_TABLET | Freq: Every day | ORAL | Status: DC
  Administered 2017-07-26: 325 mg via ORAL
  Filled 2017-07-25: qty 1

## 2017-07-25 MED ORDER — ACETAMINOPHEN 160 MG/5ML PO SOLN: 650.0000 mg | ORAL | Status: DC | PRN

## 2017-07-25 NOTE — Telephone Encounter (Signed)
Spoke with patient and she stated she has been dizzy since she woke up this am along with difficulty walking/gait issues. When asked if the walking difficulty was like when she had her stroke states it is similar. Advised patient needed to go to ED for evaluation, verbalized understanding.

## 2017-07-25 NOTE — Telephone Encounter (Signed)
New Message  STAT if patient feels like he/she is going to faint   1) Are you dizzy now? Yes   2) Do you feel faint or have you passed out? Feel like she is going to faint but has not passed out  3) Do you have any other symptoms? No   4) Have you checked your HR and BP (record if available)? no

## 2017-07-25 NOTE — ED Notes (Signed)
No respiratory or acute distress noted alert and oriented x 3 clear speech noted moves all extremities only complains of dizziness.

## 2017-07-25 NOTE — ED Triage Notes (Signed)
Patient states she woke up this morning feeling dizzy and had one episode of emesis. Reports feeling off balance when walking. Feels that left side has different sensation when touched compared to right. No facial changes, bilateral grip strength strong. States she did not take insulin this am.

## 2017-07-25 NOTE — ED Provider Notes (Signed)
[facility] DEPT Provider Note   CSN: [id number] Arrival date & time: 07/25/17  1327   History   Chief Complaint Chief Complaint  Patient presents with  . Dizziness    HPI Christina Orozco is a 47 y.o. female who presents with dizziness. PMH significant for IDDM, hx of CVA without significant residual deficits, HTN, medical non-compliance. She states that this morning she woke up with an acute onset of dizziness when she rolled over to hit the snooze button. She stood up and felt so dizzy that she had to lie back down. Positional changes have not improved her dizziness, however, and she has felt dizzy no matter what position she's in. She's felt off balance as well and "feels drunk" but hasn't been drinking. She has been taking all her medicines except her insulin because she couldn't stick herself. She denies headache, vision changes, ear pain, chest pain, SOB, abdominal pain.   HPI  Past Medical History:  Diagnosis Date  . Asthma   . DDD (degenerative disc disease), cervical   . Diabetes mellitus   . Diabetes mellitus ([place])   . Dislocated shoulder    right  . GERD (gastroesophageal reflux disease)   . Hypertension   . Hypertension   . Neuropathy   . Stroke [facility])     Patient Active Problem List   Diagnosis Date Noted  . Anxiety as acute reaction to exceptional stress 04/23/2017  . Insomnia 10/08/2016  . Bacterial vaginitis 10/08/2016  . Neuropathy 06/07/2016  . Gait instability   . Lesion of pons   . Slurred speech   . Type 2 diabetes mellitus with hyperosmolar nonketotic hyperglycemia ([place]) 04/30/2016  . Dizziness 04/30/2016  . Hyperglycemia 04/30/2016  . Chronic kidney disease (CKD), stage III (moderate) ([place]) 04/30/2016  . Ataxia 04/30/2016  . Chronic right shoulder pain 04/03/2016  . 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 Type 2 diabetes mellitus with diabetic neuropathy, with long-term current use of insulin ([place])  11/16/2015  . Rhinitis, allergic 08/25/2015  . Asthma, mild intermittent 07/14/2015  . Gastroesophageal reflux disease without esophagitis 06/08/2015  . Alopecia 06/08/2015  . Type 2 diabetes mellitus without complication, without long-term current use of insulin ([place]) 05/05/2015  . Essential hypertension 05/05/2015  . Vitamin D deficiency 05/05/2015    Past Surgical History:  Procedure Laterality Date  . ADENOIDECTOMY    . APPENDECTOMY    . CERVICAL SPINE SURGERY  06/2012   C5-C6  . EYE SURGERY     left eye surgery   . SHOULDER SURGERY    . TONSILLECTOMY      OB History    No data available       Home Medications    Prior to Admission medications   Medication Sig Start Date End Date Taking? Authorizing Provider  albuterol (PROVENTIL HFA;VENTOLIN HFA) 108 (90 Base) MCG/ACT inhaler Inhale 1-2 puffs into the lungs every 6 (six) hours as needed for wheezing or shortness of breath. 07/13/15  Yes Dorena Dew, FNP  amLODipine (NORVASC) 5 MG tablet Take 1 tablet (5 mg total) by mouth daily. 07/19/17 10/17/17 Yes Skeet Latch, MD  aspirin EC 325 MG tablet Take 1 tablet (325 mg total) by mouth daily. 07/03/17  Yes Melvenia Beam, MD  atorvastatin (LIPITOR) 40 MG tablet Take 1 tablet (40 mg total) by mouth daily at 6 PM. 12/05/16  Yes Dorena Dew, FNP  carvedilol (COREG) 25 MG tablet Take 1 tablet (25 mg total) by mouth 2 (  two) times daily with a meal. Take 25 mg twice daily 01/09/17  Yes Skeet Latch, MD  doxazosin (CARDURA) 4 MG tablet Take 1 tablet (4 mg total) by mouth daily. 07/19/17  Yes Skeet Latch, MD  hydrochlorothiazide (HYDRODIURIL) 12.5 MG tablet Take 12.5 mg by mouth daily.   Yes [provider]  insulin aspart (NOVOLOG) 100 UNIT/ML FlexPen Inject 10 Units into the skin 3 (three) times daily with meals. 12/29/16  Yes Dorena Dew, FNP  Insulin Detemir (LEVEMIR FLEXTOUCH) 100 UNIT/ML Pen 30 units in am and 20 units pm Patient taking  differently: Inject 30 Units into the skin 2 (two) times daily.  11/21/16  Yes Dorena Dew, FNP    Family History Family History  Problem Relation Age of Onset  . Vascular Disease Mother   . CAD Mother   . Heart failure Mother   . Heart disease Unknown   . Cancer Unknown        colon, stomach, lung  . Diabetes Unknown   . Seizures Unknown   . Breast cancer Sister 84    Social History Social History   Tobacco Use  . Smoking status: Never Smoker  . Smokeless tobacco: Never Used  Substance Use Topics  . Alcohol use: No  . Drug use: No     Allergies   Lisinopril; Penicillins; and Gabapentin   Review of Systems Review of Systems  Constitutional: Negative for chills and fever.  Respiratory: Negative for shortness of breath.   Cardiovascular: Negative for chest pain.  Gastrointestinal: Negative for abdominal pain.  Neurological: Positive for dizziness. Negative for syncope, numbness and headaches.  All other systems reviewed and are negative.    Physical Exam Updated Vital Signs BP (!) 148/85 (BP Location: Right Arm)   Pulse 74   Temp 98.2 F (36.8 C) (Oral)   Resp 17   LMP 06/11/2017   SpO2 100%   Physical Exam  Constitutional: She is oriented to person, place, and time. She appears well-developed and well-nourished. No distress.  HENT:  Head: Normocephalic and atraumatic.  Eyes: Conjunctivae are normal. Pupils are equal, round, and reactive to light. Right eye exhibits no discharge. Left eye exhibits no discharge. No scleral icterus.  Neck: Normal range of motion.  Cardiovascular: Normal rate.  Pulmonary/Chest: Effort normal. No respiratory distress.  Abdominal: She exhibits no distension.  Neurological: She is alert and oriented to person, place, and time.  Mental Status:  Alert, oriented, thought content appropriate, able to give a coherent history. Speech fluent without evidence of aphasia. Able to follow 2 step commands without difficulty.    Cranial Nerves:  II:  Peripheral visual fields grossly normal, pupils equal, round, reactive to light III,IV, VI: ptosis not present. Lateral nystagmus noted V,VII: smile symmetric, facial light touch sensation equal VIII: hearing grossly normal to voice  X: uvula elevates symmetrically  XI: bilateral shoulder shrug symmetric and strong XII: midline tongue extension without fassiculations Motor:  Normal tone. 5/5 in upper and lower extremities bilaterally including strong and equal grip strength and dorsiflexion/plantar flexion Sensory: Pinprick and light touch normal in all extremities.  Cerebellar: normal finger-to-nose with bilateral upper extremities Gait: Ataxic CV: distal pulses palpable throughout    Skin: Skin is warm and dry.  Psychiatric: She has a normal mood and affect. Her behavior is normal.  Nursing note and vitals reviewed.    ED Treatments / Results  Labs (all labs ordered are listed, but only abnormal results are displayed) Labs Reviewed  BASIC METABOLIC PANEL - Abnormal; Notable for the following components:      Result Value   Glucose, Bld 345 (*)    BUN 34 (*)    Creatinine, Ser 1.80 (*)    GFR calc non Af Amer 33 (*)    GFR calc Af Amer 38 (*)    All other components within normal limits  CBC - Abnormal; Notable for the following components:   RBC 3.54 (*)    Hemoglobin 10.2 (*)    HCT 29.6 (*)    All other components within normal limits  URINALYSIS, ROUTINE W REFLEX MICROSCOPIC - Abnormal; Notable for the following components:   Glucose, UA >=500 (*)    Hgb urine dipstick SMALL (*)    Protein, ur 30 (*)    Squamous Epithelial / LPF 0-5 (*)    All other components within normal limits  CBG MONITORING, ED - Abnormal; Notable for the following components:   Glucose-Capillary 295 (*)    All other components within normal limits  HIV ANTIBODY (ROUTINE TESTING)  HEMOGLOBIN A1C  LIPID PANEL  CBC  COMPREHENSIVE METABOLIC PANEL  I-STAT BETA HCG  BLOOD, ED (MC, WL, AP ONLY)    EKG  EKG Interpretation None       Radiology Ct Head Wo Contrast  Result Date: 07/25/2017 CLINICAL DATA:  Dizziness.  Emesis.  Possible stroke. EXAM: CT HEAD WITHOUT CONTRAST TECHNIQUE: Contiguous axial images were obtained from the base of the skull through the vertex without intravenous contrast. COMPARISON:  Head CT 06/29/2017 Brain MRI 06/20/2016 FINDINGS: Brain: No mass lesion, intraparenchymal hemorrhage or extra-axial collection. No evidence of acute cortical infarct. Brain parenchyma and CSF-containing spaces are normal for age. Vascular: No hyperdense vessel or unexpected calcification. Skull: Normal calvarium and visualized skull base. Unchanged small right frontal scalp lipoma. Sinuses/Orbits: No sinus fluid levels or advanced mucosal thickening. No mastoid effusion. Normal orbits. IMPRESSION: Normal head CT. Electronically Signed   By: Ulyses Jarred M.D.   On: 07/25/2017 17:22   Mr Brain Wo Contrast  Result Date: 07/25/2017 CLINICAL DATA:  Dizziness and vomiting EXAM: MRI HEAD WITHOUT CONTRAST TECHNIQUE: Multiplanar, multiecho pulse sequences of the brain and surrounding structures were obtained without intravenous contrast. COMPARISON:  Head CT 07/25/2017 Brain MRI 07/02/2017 FINDINGS: Brain: Small focus of abnormal diffusion restriction within the superior left paramedian pons, measuring approximately 6 mm. No mass lesion, hydrocephalus, dural abnormality or extra-axial collection. There is age advanced multifocal hyperintense T2-weighted signal within the periventricular white matter, most often a result of chronic microvascular ischemia. No age-advanced or lobar predominant atrophy. Foci of chronic microhemorrhage are noted in the left caudate and right thalamus. Vascular: Major intracranial arterial and venous sinus flow voids are preserved. Skull and upper cervical spine: Right frontal scalp lipoma. Normal calvarium and skull base. Sinuses/Orbits: No  fluid levels or advanced mucosal thickening. No mastoid or middle ear effusion. Normal orbits. IMPRESSION: 1. Small, 6 mm focus of acute ischemia within the left paramedian pons. 2. 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 Electronically Signed   By: Ulyses Jarred M.D.   On: 07/25/2017 20:29    Procedures Procedures (including critical care time)  Medications Ordered in ED Medications  LORazepam (ATIVAN) injection 1 mg (not administered)  sodium chloride 0.9 % bolus 1,000 mL (1,000 mLs Intravenous New Bag/Given 07/25/17 1654)  meclizine (ANTIVERT) tablet 50 mg (50 mg Oral Given 07/25/17 1654)     Initial Impression / Assessment and Plan /  ED Course  I have reviewed the triage vital signs and the nursing notes.  Pertinent labs & imaging results that were available during my care of the patient were reviewed by me and considered in my medical decision making (see chart for details).  47 year old with acute onset of dizziness. Vitals are normal. Exam is remarkable for lateral nystagmus and ataxia. No dysmetria noted. Will ordered labs and CT of head.  6:24 PM She does not feel better after fluids and meclizine. CT is negative. MRI ordered.  9:10 PM MRI shows acute ischemia in the left paramedian pons. She still feels very dizzy. Will order Ativan and consult neurology.  Spoke with Dr. Lorraine Lax with neuro who will come to see patient. He would like the patient to be transferred to [facility]. I spoke with [place] with [facility] who will admit the patient.   Final Clinical Impressions(s) / ED Diagnoses   Final diagnoses:  Acute CVA (cerebrovascular accident) [facility])  Dizziness    ED Discharge Orders    None       Recardo Evangelist, PA-C 07/25/17 2332    Milton Ferguson, MD 07/26/17 [phone]

## 2017-07-25 NOTE — ED Notes (Signed)
No respiratory or acute distress noted alert and oriented x 3 call light in reach. 

## 2017-07-25 NOTE — ED Notes (Signed)
Back from MRI no respiratory or acute distress noted alert and oriented x 3 call light in reach.

## 2017-07-25 NOTE — H&P (Signed)
History and Physical    Christina Orozco QVZ:[id number] DOB: [birth year] DOA: 07/25/2017  Referring MD/NP/PA: Janetta Hora, PA-C PCP: Dorena Dew, FNP  Patient coming from:   Chief Complaint: Dizziness  I have personally briefly reviewed patient's old medical records in [place]   HPI: Christina Orozco is a 47 y.o. female with medical history significant of HTN, DM type II, CVA, GERD; who presented with complaints of dizziness and difficulty walking since waking up this morning. Similar to symptoms with patient's previous stroke in 2017, but denied having any residual symptoms.  Associated symptoms included polydipsia, increased thirst, urinary frequency, denies having any chest pains, palpitations, nausea, vomiting, or diarrhea.   ED Course: Upon admission into the emergency department patient was noted to be afebrile, blood pressure 128/77 to 150/85, and all other vital signs maintained.  Labs revealed WBC 10.1, hemoglobin 10.2, BUN 34, creatinine 1.8, and glucose 345 without elevated anion gap.  Urinalysis was negative for any signs of infection.  Patient had normal CT scan of the brain, but MRI revealed a small 6 mm focus of acute ischemia within the left paramedian pons.  Neurology was consulted.  On the ED patient had received 1 mg of Ativan, 50 mg of meclizine, and 1 L of normal saline fluids.  Patient had taken 325 mg of aspirin prior to arrival.  Neurology had been consulted, and wanted the patient at [place] [facility]  Constitutional: Positive for malaise/fatigue. Negative for chills and fever.  HENT: Negative for ear discharge and nosebleeds.   Eyes: Negative for blurred vision.  Respiratory: Negative for cough and shortness of breath.   Cardiovascular: Negative for chest pain and palpitations.  Gastrointestinal: Negative for abdominal pain, nausea and vomiting.  Genitourinary: Positive for frequency.  Musculoskeletal: Negative for falls.  Neurological:  Positive for dizziness and focal weakness.       Positive for gait disturbance  Endo/Heme/Allergies: Positive for polydipsia.  Psychiatric/Behavioral: Negative for hallucinations and suicidal ideas.    Past Medical History:  Diagnosis Date  . Asthma   . DDD (degenerative disc disease), cervical   . Diabetes mellitus ([place])   . Dislocated shoulder    right  . GERD (gastroesophageal reflux disease)   . Hypertension   . Neuropathy   . Stroke [facility])     Past Surgical History:  Procedure Laterality Date  . ADENOIDECTOMY    . APPENDECTOMY    . CERVICAL SPINE SURGERY  06/2012   C5-C6  . EYE SURGERY     left eye surgery   . SHOULDER SURGERY    . TONSILLECTOMY       reports that  has never smoked. she has never used smokeless tobacco. She reports that she does not drink alcohol or use drugs.  Allergies  Allergen Reactions  . Lisinopril Swelling and Other (See Comments)    Facial swelling   . Penicillins Hives    Has patient had a PCN reaction causing immediate rash, facial/tongue/throat swelling, SOB or lightheadedness with hypotension: yes Has patient had a PCN reaction causing severe rash involving mucus membranes or skin necrosis: No Has patient had a PCN reaction that required hospitalization No Has patient had a PCN reaction occurring within the last 10 years: No If all of the above answers are "NO", then may proceed wit  . Gabapentin Swelling and Other (See Comments)    "I thought I was having another stroke."     Family History  Problem Relation Age of  Onset  . Vascular Disease Mother   . CAD Mother   . Heart failure Mother   . Heart disease Unknown   . Cancer Unknown        colon, stomach, lung  . Diabetes Unknown   . Seizures Unknown   . Breast cancer Sister 44    Prior to Admission medications   Medication Sig Start Date End Date Taking? Authorizing Provider  albuterol (PROVENTIL HFA;VENTOLIN HFA) 108 (90 Base) MCG/ACT inhaler Inhale 1-2 puffs into the  lungs every 6 (six) hours as needed for wheezing or shortness of breath. 07/13/15  Yes Dorena Dew, FNP  amLODipine (NORVASC) 5 MG tablet Take 1 tablet (5 mg total) by mouth daily. 07/19/17 10/17/17 Yes Skeet Latch, MD  aspirin EC 325 MG tablet Take 1 tablet (325 mg total) by mouth daily. 07/03/17  Yes Melvenia Beam, MD  atorvastatin (LIPITOR) 40 MG tablet Take 1 tablet (40 mg total) by mouth daily at 6 PM. 12/05/16  Yes Dorena Dew, FNP  carvedilol (COREG) 25 MG tablet Take 1 tablet (25 mg total) by mouth 2 (two) times daily with a meal. Take 25 mg twice daily 01/09/17  Yes Skeet Latch, MD  doxazosin (CARDURA) 4 MG tablet Take 1 tablet (4 mg total) by mouth daily. 07/19/17  Yes Skeet Latch, MD  hydrochlorothiazide (HYDRODIURIL) 12.5 MG tablet Take 12.5 mg by mouth daily.   Yes [provider]  insulin aspart (NOVOLOG) 100 UNIT/ML FlexPen Inject 10 Units into the skin 3 (three) times daily with meals. 12/29/16  Yes Dorena Dew, FNP  Insulin Detemir (LEVEMIR FLEXTOUCH) 100 UNIT/ML Pen 30 units in am and 20 units pm Patient taking differently: Inject 30 Units into the skin 2 (two) times daily.  11/21/16  Yes Dorena Dew, FNP    Physical Exam:  Constitutional: Patient is lethargic, but arousable able to follow commands with repeated attempts Vitals:   07/25/17 1537 07/25/17 1645 07/25/17 1921 07/25/17 1924  BP: (!) 148/85 (!) 150/85 128/77 128/77  Pulse: 74 78 88 77  Resp: 17 16 19 19   Temp: 98.2 F (36.8 C)   98.1 F (36.7 C)  TempSrc: Oral   Oral  SpO2: 100%   99%   Eyes: Pupils equal reactive to light.  Nystagmus present.  Lids and conjunctivae normal ENMT: Mucous membranes are dry. Posterior pharynx clear of any exudate or lesions. Neck: normal, supple, no masses, no thyromegaly Respiratory: clear to auscultation bilaterally, no wheezing, no crackles. Normal respiratory effort. No accessory muscle use.  Cardiovascular: Regular rate and rhythm,  no murmurs / rubs / gallops. No extremity edema. 2+ pedal pulses. No carotid bruits.  Abdomen: no tenderness, no masses palpated. No hepatosplenomegaly. Bowel sounds positive.  Musculoskeletal: no clubbing / cyanosis. No joint deformity upper and lower extremities. Good ROM, no contractures. Normal muscle tone.  Skin: no rashes, lesions, ulcers. No induration Neurologic: CN 2-12 grossly intact. Sensation intact, DTR normal. Strength 5/5 in all 4.  Unable to ambulate patient at this time. Psychiatric: Normal judgment and insight.  Lethargic and oriented x 3. Normal mood.     Labs on Admission: I have personally reviewed following labs and imaging studies  CBC: Recent Labs  Lab 07/25/17 1609  WBC 10.1  HGB 10.2*  HCT 29.6*  MCV 83.6  PLT 299   Basic Metabolic Panel: Recent Labs  Lab 07/19/17 0920 07/25/17 1609  NA 138 139  K 4.2 4.1  CL 103 107  CO2 22  22  GLUCOSE 166* 345*  BUN 44* 34*  CREATININE 1.97* 1.80*  CALCIUM 9.9 9.1   GFR: Estimated Creatinine Clearance: 41.7 mL/min (A) (by C-G formula based on SCr of 1.8 mg/dL (H)). Liver Function Tests: No results for input(s): AST, ALT, ALKPHOS, BILITOT, PROT, ALBUMIN in the last 168 hours. No results for input(s): LIPASE, AMYLASE in the last 168 hours. No results for input(s): AMMONIA in the last 168 hours. Coagulation Profile: No results for input(s): INR, PROTIME in the last 168 hours. Cardiac Enzymes: No results for input(s): CKTOTAL, CKMB, CKMBINDEX, TROPONINI in the last 168 hours. BNP (last 3 results) No results for input(s): PROBNP in the last 8760 hours. HbA1C: No results for input(s): HGBA1C in the last 72 hours. CBG: Recent Labs  Lab 07/25/17 1350  GLUCAP 295*   Lipid Profile: No results for input(s): CHOL, HDL, LDLCALC, TRIG, CHOLHDL, LDLDIRECT in the last 72 hours. Thyroid Function Tests: No results for input(s): TSH, T4TOTAL, FREET4, T3FREE, THYROIDAB in the last 72 hours. Anemia Panel: No results  for input(s): VITAMINB12, FOLATE, FERRITIN, TIBC, IRON, RETICCTPCT in the last 72 hours. Urine analysis:    Component Value Date/Time   COLORURINE YELLOW 07/25/2017 [place] 07/25/2017 1548   LABSPEC 1.014 07/25/2017 1548   PHURINE 5.0 07/25/2017 1548   GLUCOSEU >=500 (A) 07/25/2017 1548   HGBUR SMALL (A) 07/25/2017 1548   BILIRUBINUR NEGATIVE 07/25/2017 1548   KETONESUR NEGATIVE 07/25/2017 1548   PROTEINUR 30 (A) 07/25/2017 1548   UROBILINOGEN 0.2 04/23/2017 0920   NITRITE NEGATIVE 07/25/2017 1548   LEUKOCYTESUR NEGATIVE 07/25/2017 1548   Sepsis Labs: No results found for this or any previous visit (from the past 240 hour(s)).   Radiological Exams on Admission: Ct Head Wo Contrast  Result Date: 07/25/2017 CLINICAL DATA:  Dizziness.  Emesis.  Possible stroke. EXAM: CT HEAD WITHOUT CONTRAST TECHNIQUE: Contiguous axial images were obtained from the base of the skull through the vertex without intravenous contrast. COMPARISON:  Head CT 06/29/2017 Brain MRI 06/20/2016 FINDINGS: Brain: No mass lesion, intraparenchymal hemorrhage or extra-axial collection. No evidence of acute cortical infarct. Brain parenchyma and CSF-containing spaces are normal for age. Vascular: No hyperdense vessel or unexpected calcification. Skull: Normal calvarium and visualized skull base. Unchanged small right frontal scalp lipoma. Sinuses/Orbits: No sinus fluid levels or advanced mucosal thickening. No mastoid effusion. Normal orbits. IMPRESSION: Normal head CT. Electronically Signed   By: Ulyses Jarred M.D.   On: 07/25/2017 17:22   Mr Brain Wo Contrast  Result Date: 07/25/2017 CLINICAL DATA:  Dizziness and vomiting EXAM: MRI HEAD WITHOUT CONTRAST TECHNIQUE: Multiplanar, multiecho pulse sequences of the brain and surrounding structures were obtained without intravenous contrast. COMPARISON:  Head CT 07/25/2017 Brain MRI 07/02/2017 FINDINGS: Brain: Small focus of abnormal diffusion restriction within  the superior left paramedian pons, measuring approximately 6 mm. No mass lesion, hydrocephalus, dural abnormality or extra-axial collection. There is age advanced multifocal hyperintense T2-weighted signal within the periventricular white matter, most often a result of chronic microvascular ischemia. No age-advanced or lobar predominant atrophy. Foci of chronic microhemorrhage are noted in the left caudate and right thalamus. Vascular: Major intracranial arterial and venous sinus flow voids are preserved. Skull and upper cervical spine: Right frontal scalp lipoma. Normal calvarium and skull base. Sinuses/Orbits: No fluid levels or advanced mucosal thickening. No mastoid or middle ear effusion. Normal orbits. IMPRESSION: 1. Small, 6 mm focus of acute ischemia within the left paramedian pons. 2. Age advanced white matter disease, most commonly  results of chronic ischemic microangiopathy. Electronically Signed   By: Ulyses Jarred M.D.   On: 07/25/2017 20:29    EKG: Independently reviewed.  Sinus rhythm with bigeminy  Assessment/Plan Dizziness and ataxia 2/2 CVA: Acute.  Patient presents with complaints of dizziness and ataxia.  MRI revealing signs of a acute appearing left paramedian pons stroke. - Admit to a telemetry bed - Stroke order set initiated - Neuro checks - PT/OT/Speech to eval and treat - Check echocardiogram and  Vas carotid U/S - Check Hemoglobin A1c and lipid panel in a.m. - ASA - Appreciate neurology consultative services, will follow-up   Possible acute kidney injury on chronic kidney disease stage III: Patient's baseline creatinine appears to have previously been around 1.5 but presents with a creatinine of 1.8 and BUN elevated at 34.  Prerenal cause of symptoms is most likely. - Bolus 1 L normal saline, and then placed on a rate of 100 mL/h overnight - Recheck kidney function in a.m. - Hold nephrotoxic agents   Diabetes mellitus type 2: Uncontrolled.  Last hemoglobin A1c noted to  be 10.8 on 04/23/2017.  Home regimen of Lantus is 30 units every morning and 20 units q. at bedtime. - Hypoglycemic protocols - Follow-up hemoglobin A1c - Lantus 30 units twice daily - CBGs q. before meals with resistant SSI  Anemia of chronic disease: Hemoglobin 10.2 on admission which appears near patient's baseline.  Continue to monitor-  - Recheck CBC in a.m.  Essential hypertension - Continue amlodipine, Coreg, and doxazosin  - Held hydrochlorothiazide  Hyperlipidemia - Continue atorvastatin  DVT prophylaxis: Lovenox Code Status: Full Family Communication: No family present at bedside Disposition Plan: To be determined Consults called: Neurology Admission status: Inpatient  Norval Morton MD [facility] Pager [phone]   If 7PM-7AM, please contact night-coverage [email or web address] Password [facility]  07/25/2017, 10:15 PM

## 2017-07-26 ENCOUNTER — Ambulatory Visit: Payer: Medicaid Other | Admitting: Family Medicine

## 2017-07-26 ENCOUNTER — Inpatient Hospital Stay (HOSPITAL_COMMUNITY): Payer: BLUE CROSS/BLUE SHIELD

## 2017-07-26 ENCOUNTER — Other Ambulatory Visit: Payer: Self-pay

## 2017-07-26 ENCOUNTER — Encounter (HOSPITAL_COMMUNITY): Payer: Self-pay | Admitting: *Deleted

## 2017-07-26 DIAGNOSIS — I340 Nonrheumatic mitral (valve) insufficiency: Secondary | ICD-10-CM

## 2017-07-26 DIAGNOSIS — E1169 Type 2 diabetes mellitus with other specified complication: Secondary | ICD-10-CM | POA: Diagnosis present

## 2017-07-26 DIAGNOSIS — I639 Cerebral infarction, unspecified: Principal | ICD-10-CM

## 2017-07-26 DIAGNOSIS — E785 Hyperlipidemia, unspecified: Secondary | ICD-10-CM | POA: Diagnosis present

## 2017-07-26 DIAGNOSIS — R42 Dizziness and giddiness: Secondary | ICD-10-CM

## 2017-07-26 DIAGNOSIS — I6302 Cerebral infarction due to thrombosis of basilar artery: Secondary | ICD-10-CM

## 2017-07-26 LAB — CBC
HCT: 30 % — ABNORMAL LOW (ref 36.0–46.0)
HEMOGLOBIN: 9.8 g/dL — AB (ref 12.0–15.0)
MCH: 27.8 pg (ref 26.0–34.0)
MCHC: 32.7 g/dL (ref 30.0–36.0)
MCV: 85.2 fL (ref 78.0–100.0)
Platelets: 259 10*3/uL (ref 150–400)
RBC: 3.52 MIL/uL — ABNORMAL LOW (ref 3.87–5.11)
RDW: 12.9 % (ref 11.5–15.5)
WBC: 7.3 10*3/uL (ref 4.0–10.5)

## 2017-07-26 LAB — COMPREHENSIVE METABOLIC PANEL
ALBUMIN: 3 g/dL — AB (ref 3.5–5.0)
ALK PHOS: 59 U/L (ref 38–126)
ALT: 11 U/L — ABNORMAL LOW (ref 14–54)
ANION GAP: 8 (ref 5–15)
AST: 16 U/L (ref 15–41)
BUN: 23 mg/dL — ABNORMAL HIGH (ref 6–20)
CO2: 21 mmol/L — AB (ref 22–32)
Calcium: 8.8 mg/dL — ABNORMAL LOW (ref 8.9–10.3)
Chloride: 113 mmol/L — ABNORMAL HIGH (ref 101–111)
Creatinine, Ser: 1.6 mg/dL — ABNORMAL HIGH (ref 0.44–1.00)
GFR calc Af Amer: 44 mL/min — ABNORMAL LOW (ref >60)
GFR calc non Af Amer: 38 mL/min — ABNORMAL LOW (ref >60)
GLUCOSE: 104 mg/dL — AB (ref 65–99)
Potassium: 3.6 mmol/L (ref 3.5–5.1)
SODIUM: 142 mmol/L (ref 135–145)
Total Bilirubin: 0.5 mg/dL (ref 0.3–1.2)
Total Protein: 6.5 g/dL (ref 6.5–8.1)

## 2017-07-26 LAB — ECHOCARDIOGRAM COMPLETE
AOASC: 28 cm
CHL CUP MV DEC (S): 236
E/e' ratio: 12.17
EWDT: 236 ms
FS: 38 % (ref 28–44)
HEIGHTINCHES: 63.6 in
IV/PV OW: 1.04
LA ID, A-P, ES: 37 mm
LADIAMINDEX: 1.82 cm/m2
LAVOL: 61.7 mL
LAVOLA4C: 56.9 mL
LAVOLIN: 30.4 mL/m2
LEFT ATRIUM END SYS DIAM: 37 mm
LV E/e' medial: 12.17
LV PW d: 10.6 mm — AB (ref 0.6–1.1)
LV e' LATERAL: 6.74 cm/s
LVEEAVG: 12.17
LVOT area: 3.46 cm2
LVOTD: 21 mm
Lateral S' vel: 15.3 cm/s
MV Peak grad: 3 mmHg
MV pk E vel: 82 m/s
MVAP: 3.19 cm2
MVPKAVEL: 79.5 m/s
MVSPHT: 69 ms
TAPSE: 24.4 mm
TDI e' lateral: 6.74
TDI e' medial: 7.72
Weight: 3128.7683 oz

## 2017-07-26 LAB — LIPID PANEL
CHOLESTEROL: 99 mg/dL (ref 0–200)
HDL: 42 mg/dL (ref >40)
LDL Cholesterol: 43 mg/dL (ref 0–99)
TRIGLYCERIDES: 70 mg/dL (ref <150)
Total CHOL/HDL Ratio: 2.4 RATIO
VLDL: 14 mg/dL (ref 0–40)

## 2017-07-26 LAB — GLUCOSE, CAPILLARY
GLUCOSE-CAPILLARY: 148 mg/dL — AB (ref 65–99)
GLUCOSE-CAPILLARY: 215 mg/dL — AB (ref 65–99)
Glucose-Capillary: 86 mg/dL (ref 65–99)
Glucose-Capillary: 104 mg/dL — ABNORMAL HIGH (ref 65–99)

## 2017-07-26 LAB — RAPID URINE DRUG SCREEN, HOSP PERFORMED
AMPHETAMINES: NOT DETECTED
Barbiturates: NOT DETECTED
Benzodiazepines: NOT DETECTED
Cocaine: NOT DETECTED
Opiates: NOT DETECTED
Tetrahydrocannabinol: NOT DETECTED

## 2017-07-26 LAB — HEMOGLOBIN A1C
Hgb A1c MFr Bld: 12.5 % — ABNORMAL HIGH (ref 4.8–5.6)
Mean Plasma Glucose: 312.05 mg/dL

## 2017-07-26 LAB — CBG MONITORING, ED: GLUCOSE-CAPILLARY: 210 mg/dL — AB (ref 65–99)

## 2017-07-26 MED ORDER — ASPIRIN EC 81 MG PO TBEC: 81.0000 mg | DELAYED_RELEASE_TABLET | Freq: Every day | ORAL | Status: DC

## 2017-07-26 MED ORDER — ASPIRIN EC 325 MG PO TBEC
325.0000 mg | DELAYED_RELEASE_TABLET | Freq: Every day | ORAL | Status: DC
  Administered 2017-07-27: 325 mg via ORAL
  Filled 2017-07-26: qty 1

## 2017-07-26 MED ORDER — INSULIN ASPART 100 UNIT/ML ~~LOC~~ SOLN
10.0000 [IU] | Freq: Three times a day (TID) | SUBCUTANEOUS | Status: DC
  Administered 2017-07-26: 10 [IU] via SUBCUTANEOUS

## 2017-07-26 MED ORDER — CLOPIDOGREL BISULFATE 75 MG PO TABS
75.0000 mg | ORAL_TABLET | Freq: Every day | ORAL | Status: DC
  Administered 2017-07-26 – 2017-07-27 (×2): 75 mg via ORAL
  Filled 2017-07-26 (×2): qty 1

## 2017-07-26 MED ORDER — MECLIZINE HCL 12.5 MG PO TABS
25.0000 mg | ORAL_TABLET | Freq: Three times a day (TID) | ORAL | Status: DC | PRN
  Administered 2017-07-26: 25 mg via ORAL
  Filled 2017-07-26: qty 2

## 2017-07-26 NOTE — Progress Notes (Signed)
Inpatient Diabetes Program Recommendations  AACE/ADA: New Consensus Statement on Inpatient Glycemic Control (2015)  Target Ranges:  Prepandial:   less than 140 mg/dL      Peak postprandial:   less than 180 mg/dL (1-2 hours)      Critically ill patients:  140 - 180 mg/dL   Spoke with patient about diabetes and home regimen for diabetes control. Patient reports that she is followed by her PCP for diabetes management. Patient reports that they taking insulin as prescribed, however, patient can't afford her testing strips. Spoke with patient about the ReliOn brand meter and strips. Discussed A1C results (12.5% this admission). Discussed glucose and A1C goals. Discussed importance of checking CBGs and maintaining good CBG control to prevent long-term and short-term complications. Explained how hyperglycemia leads to damage within blood vessels which lead to the common complications seen with uncontrolled diabetes. Stressed to the patient the importance of improving glycemic control to prevent further complications from uncontrolled diabetes. Discussed impact of nutrition, exercise, stress, sickness, and medications on diabetes control. Patient reports following a DM diet: Eats salads with crab meat cheese Eats appropriate sized servings of fruit Does not eat sweets only when she thinks she is having a low Patient reports gaining weight while on insulin which is probably resulting in her poor control. Patient is limited in her physical activity as well. Patient reports wanting to "munch during the day on fruit" discussed refraining from [place] outside of her allowed meals and snacks. Patient verbalized understanding of information discussed and has no further questions at this time related to diabetes.  Thanks,  Tama Headings RN, MSN, [facility] Inpatient Diabetes Coordinator Team Pager [phone] (8a-5p)

## 2017-07-26 NOTE — Progress Notes (Signed)
VASCULAR LAB PRELIMINARY  PRELIMINARY  PRELIMINARY  PRELIMINARY  Carotid duplex completed.    Preliminary report:  1-39% ICA plaquing. Vertebral artery flow is antegrade.   Pieter Fooks, RVT 07/26/2017, 10:28 AM

## 2017-07-26 NOTE — Progress Notes (Signed)
NEUROHOSPITALISTS STROKE TEAM - DAILY PROGRESS NOTE   ADMISSION HISTORY: Christina Orozco is an 47 y.o. female with past medical history of uncontrolled hypertension, uncontrolled diabetes mellitus, prior CVA presents with sudden onset dizziness on waking up his morning at 7 AM. Last known normal was 9:30 PM when she went to bed. She describes her dizziness as her feeling off balance. She denies any tinnitus, dysarthria, weakness in arm or leg. MRI brain was performed which showed a very small left pontine infarct. Neurology was consulted for further stroke workup. Patient is on aspirin 325 mg which which she is compliant since her last stroke in 2017.  Date last known well: 2.12.18 Time last known well:  9:30 PM tPA Given:  No outside window NIHSS:  0 Baseline MRS 0  SUBJECTIVE (INTERVAL HISTORY) No family is at the bedside. Patient is found laying in bed in NAD. Overall she feels her condition is gradually improving. Voices no new complaints. No new events reported overnight.   OBJECTIVE Lab Results: CBC:  Recent Labs  Lab 07/25/17 1609 07/26/17 1118  WBC 10.1 7.3  HGB 10.2* 9.8*  HCT 29.6* 30.0*  MCV 83.6 85.2  PLT 260 259   BMP: Recent Labs  Lab 07/25/17 1609 07/26/17 1118  NA 139 142  K 4.1 3.6  CL 107 113*  CO2 22 21*  GLUCOSE 345* 104*  BUN 34* 23*  CREATININE 1.80* 1.60*  CALCIUM 9.1 8.8*   Liver Function Tests:  Recent Labs  Lab 07/26/17 1118  AST 16  ALT 11*  ALKPHOS 59  BILITOT 0.5  PROT 6.5  ALBUMIN 3.0*   Urinalysis:  Recent Labs  Lab 07/25/17 [place]  LABSPEC 1.014  PHURINE 5.0  GLUCOSEU >=500*  HGBUR SMALL*  BILIRUBINUR NEGATIVE  KETONESUR NEGATIVE  PROTEINUR 30*  NITRITE NEGATIVE  LEUKOCYTESUR NEGATIVE   Urine Drug Screen:     Component Value Date/Time   LABOPIA NONE DETECTED 07/25/2017 1548   COCAINSCRNUR NONE DETECTED 07/25/2017 1548   LABBENZ NONE DETECTED 07/25/2017 1548   AMPHETMU NONE DETECTED 07/25/2017 1548   THCU NONE DETECTED 07/25/2017 1548   LABBARB NONE DETECTED 07/25/2017 1548    Alcohol Level: No results for input(s): ETH in the last 168 hours.  PHYSICAL EXAM Temp:  [97.7 F (36.5 C)-98.6 F (37 C)] 98.6 F (37 C) (02/14 0944) Pulse Rate:  [74-88] 79 (02/14 0944) Resp:  [16-19] 19 (02/14 0944) BP: (122-150)/(64-88) 143/78 (02/14 0944) SpO2:  [95 %-100 %] 100 % (02/14 0944) FiO2 (%):  [21 %] 21 % (02/13 2312) Weight:  [88.7 kg (195 lb 8.8 oz)] 88.7 kg (195 lb 8.8 oz) (02/14 0612) General - Well nourished, well developed, in no apparent distress HEENT-  Normocephalic,  Cardiovascular - Regular rate and rhythm  Respiratory - Lungs clear bilaterally. No wheezing. Abdomen - soft and non-tender, BS normal Extremities- no edema or cyanosis Mental Status: Alert, oriented, thought content appropriate.  Speech fluent without evidence of aphasia. Able to follow 3 step commands without difficulty. Cranial Nerves: II: Visual fields grossly normal,  III,IV, VI: ptosis not present, extra-ocular motions intact bilaterally, pupils equal, round, reactive to light and accommodation V,VII: smile symmetric, facial light touch sensation normal bilaterally VIII: hearing normal bilaterally IX,X: uvula rises symmetrically XI: bilateral shoulder shrug XII: midline tongue extension Motor: Right : Upper extremity   5/5    Left:     Upper extremity   5/5  Lower extremity   5/5  Lower extremity   5/5 Tone and bulk:normal tone throughout; no atrophy noted Sensory: Pinprick and light touch intact throughout, bilaterally, slightly hypersensitive on right Deep Tendon Reflexes: 2+ and symmetric throughout Plantars: Right: downgoing   Left: downgoing Cerebellar: normal finger-to-nose, normal rapid alternating movements and normal heel-to-shin test Gait: normal gait and station  IMAGING: I have personally reviewed the  radiological images below and agree with the radiology interpretations. Ct Head Wo Contrast Result Date: 07/25/2017 IMPRESSION: Normal head CT. Electronically Signed   By: Ulyses Jarred M.D.   On: 07/25/2017 17:22   Mr Brain Wo Contrast Result Date: 07/25/2017 IMPRESSION: 1. Small, 6 mm focus of acute ischemia within the left paramedian pons. 2. 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 Electronically Signed   By: Ulyses Jarred M.D.   On: 07/25/2017 20:29   Echocardiogram:                       RESULTS PENDING 11/2016- Severe LVH, EF 65-70% Grade 1 DD  B/L Carotid U/S:1-39% ICA plaquing. Vertebral artery flow is antegrade     IMPRESSION: Christina Orozco is a 47 y.o. 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   Small, 6 mm focus of acute ischemia within the left paramedian pons  Suspected Etiology: small vessel disease Resultant Symptoms: Dizziness Stroke Risk Factors: diabetes mellitus, hyperlipidemia and hypertension Other Stroke Risk Factors: Obesity, Body mass index is 33.99 kg/m. , Hx stroke  Outstanding Stroke Work-up Studies:     Echocardiogram:                                                    RESULTS PENDING  07/26/2017 ASSESSMENT:   Neuro exam stable with +dizziness and slight numbness on Right side. Imaging and work up reviewed. Reinforced importance of medication compliance and disease management. Will need close PCP follow up. Neurology follow up in 6 weeks  PLAN  07/26/2017: Continue Aspirin/ Plavix/ Statin Frequent neuro checks Telemetry monitoring PT/OT/SLP Consult PM & Rehab Consult Case Management /MSW Ongoing aggressive stroke risk factor management Patient counseled to be compliant with her/his antithrombotic medications Patient counseled on Lifestyle modifications including, Diet,  Exercise, and Stress Follow up with [facility] in 6 weeks  HX OF STROKES: 04/2016- No residual deficits  HYPERTENSION: Stable Permissive hypertension (OK if <220/120) for 24-48 hours post stroke and then gradually normalized within 5-7 days. Long term BP goal normotensive. May slowly restart home B/P medications after 48 hours Home Meds: Multiple B/P meds  HYPERLIPIDEMIA:    Component Value Date/Time   CHOL 99 07/26/2017 1118   TRIG 70 07/26/2017 1118   HDL 42 07/26/2017 1118   CHOLHDL 2.4 07/26/2017 1118   VLDL 14 07/26/2017 1118   LDLCALC 43 07/26/2017 1118  Home Meds:  Lipitor 40 mg LDL  goal < 70 Continued on  Lipitor to 40 mg daily Continue statin at discharge  DIABETES: Lab Results  Component Value Date   HGBA1C 12.5 (H) 07/26/2017  HgbA1c goal < 7.0 Currently on: Novolog Continue CBG monitoring and SSI to maintain glucose 140-180 mg/dl DM education   OBESITY Obesity, Body mass index is 33.99 kg/m. Greater than/equal to 30  Other  Active Problems: Principal Problem:   CVA (cerebral vascular accident) ([place]) Active Problems:   Diabetes mellitus type 2, with complication, on long term insulin pump [facility])   Essential hypertension   Ataxia   Hyperlipidemia    Hospital day # 1 VTE prophylaxis: Lovenox  Diet : Fall precautions Diet heart healthy/carb modified Room service appropriate? Yes; Fluid consistency: Thin   FAMILY UPDATES: No family at bedside  TEAM UPDATES: Rai, Vernelle Emerald, MD   Prior Home Stroke Medications:  aspirin 325 mg daily  Discharge Stroke Meds:  Please discharge patient on aspirin 325 mg daily and clopidogrel 75 mg daily   Disposition: 07-Left Against Medical Advice/Left Without Being Seen/Elopement Therapy Recs:               PENDING Follow Up:  Follow-up Information    Melvenia Beam, MD. Schedule an appointment as soon as possible for a visit in 6 week(s).   Specialty:  Neurology Contact information: [address] [address] [phone]          Dorena Dew, FNP -PCP Follow up in 1-2 weeks      Assessment & plan discussed with with attending physician and they are in agreement.    Renie Ora Stroke Neurology Team 07/26/2017 2:35 PM  Attending note: I reviewed above note and agree with the assessment and plan. I have made any additions or clarifications directly to the above note. Pt was seen and examined.   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  Patient had 1 week history of dysarthria and gait imbalance in 04/2016.  MRI showed pontine diffusion restriction.  Patient glucose was high  at that time and corrected quickly.  Her symptoms and MRI imaging consistent with CPM.  MRA negative.  Repeat MRI brain in 06/2016, CPM resolved.  She follows with Dr. Jaynee Eagles at [facility].   On this admission, MRI showed left pontine small infarct.  EF 65-70%.  Carotid Doppler unremarkable.  MRA head pending.  LDL 43 and A1c 12.5.  Patient stated that she has financial difficulties, not able to check BP at home, not checking glucose at home.  His currently on charity program for insulin.  She is on aspirin 325 and Lipitor 40 at home.  Now started during the platelet for minor stroke treatment.  Continue aspirin and Plavix for 3 weeks and then Plavix alone.  Continue Lipitor 40.  [name], MD PhD Stroke Neurology 07/26/2017 9:35 PM   To contact Stroke Continuity provider, please refer to [email or web address] After hours, contact General Neurology

## 2017-07-26 NOTE — Progress Notes (Signed)
CM consulted for medication assistance. Pt has [employer]BS but medication co pays she says are still too expensive. Pt states most of her co pays are $5-10 but the insulin is the highest cost. CM will provide her assistance with the insulin cost. CM will also go over her other medications to see if any other assistance can be provided. CM following.

## 2017-07-26 NOTE — Evaluation (Signed)
Physical Therapy Evaluation Patient Details Name: Christina Orozco MRN: [id number] DOB: Jul 22, 1970 Today's Date: 07/26/2017   History of Present Illness  Patient is a 47 year old female with hypertension, diabetes mellitus type 2 on insulin, CVA(without sequelae per pt), GERD; admitted 07/25/17 with dizziness and difficulty walking since waking up on the morning of admission.  MRI revealed small 6 mm focus of acute ischemia in the left paramedian pons.  Clinical Impression  Pt admitted with above diagnosis. Pt currently with functional limitations due to the deficits listed below (see PT Problem List).  pt is independent at her baseline, she presents today with severe dizziness d/t ischemic pons infarct; pt mobility/PT eval is limited today by this severe dizziness which worsens with movement and pt does not wish to attempt OOB; pt strength/coordination/sensation grossly intact therefore recommend HHPT vs OPPT at this time, will closely monitor for d/c needs and update PT POC as needed; Pt will benefit from skilled PT to increase their independence and safety with mobility to allow discharge to the venue listed below.       Follow Up Recommendations Home health PT;Outpatient PT(OPPT if progresses & has transportation)    Equipment Recommendations  Other (comment)(TBD)    Recommendations for Other Services       Precautions / Restrictions Precautions Precautions: Fall      Mobility  Bed Mobility Overal bed mobility: Needs Assistance Bed Mobility: Sit to Supine;Supine to Sit     Supine to sit: Supervision;HOB elevated Sit to supine: Supervision   General bed mobility comments: pt uses rail and requires incr time to elevate trunk d/t dizziness, HOB @ 30*; dizziness worsens upon sitting and pt is unable to keep eyes open  Transfers                 General transfer comment: unable to attempt, pt declines d/t worsening dizziness  Ambulation/Gait                 Stairs            Wheelchair Mobility    Modified Rankin (Stroke Patients Only)       Balance Overall balance assessment: Needs assistance Sitting-balance support: Bilateral upper extremity supported;Feet unsupported Sitting balance-Leahy Scale: Fair Sitting balance - Comments: pt is able to maintain static sit with UE support and close supervision, unable to test further d/t dizzness,pt feels as she is falling in varied directions, pt declined scooting to EOB to bring LEs to floor                                     Pertinent Vitals/Pain Pain Assessment: No/denies pain    Home Living Family/patient expects to be discharged to:: Private residence Living Arrangements: Children(21 and 4 yo) Available Help at Discharge: Available PRN/intermittently Type of Home: House Home Access: Level entry     Home Layout: One level Home Equipment: Walker - 2 wheels;Grab bars - tub/shower      Prior Function Level of Independence: Independent         Comments: pt reports she used the RW for approximately 93month after previous CVA, no more after that     Hand Dominance        Extremity/Trunk Assessment   Upper Extremity Assessment Upper Extremity Assessment: Defer to OT evaluation    Lower Extremity Assessment Lower Extremity Assessment: Overall WFL for tasks assessed    Cervical / Trunk Assessment  Cervical / Trunk Assessment: Normal  Communication   Communication: No difficulties  Cognition Arousal/Alertness: Awake/alert Behavior During Therapy: WFL for tasks assessed/performed Overall Cognitive Status: Within Functional Limits for tasks assessed                                        General Comments      Exercises     Assessment/Plan    PT Assessment Patient needs continued PT services  PT Problem List Decreased balance;Decreased knowledge of use of DME;Decreased activity tolerance;Decreased mobility       PT  Treatment Interventions DME instruction;Therapeutic activities;Gait training;Therapeutic exercise;Patient/family education;Balance training;Stair training;Functional mobility training    PT Goals (Current goals can be found in the Care Plan section)  Acute Rehab PT Goals Patient Stated Goal: to feel better PT Goal Formulation: With patient Time For Goal Achievement: 08/09/17 Potential to Achieve Goals: Good    Frequency Min 3X/week   Barriers to discharge        Co-evaluation               AM-PAC PT "6 Clicks" Daily Activity  Outcome Measure Difficulty turning over in bed (including adjusting bedclothes, sheets and blankets)?: A Little Difficulty moving from lying on back to sitting on the side of the bed? : A Little Difficulty sitting down on and standing up from a chair with arms (e.g., wheelchair, bedside commode, etc,.)?: Unable Help needed moving to and from a bed to chair (including a wheelchair)?: A Lot Help needed walking in hospital room?: A Lot Help needed climbing 3-5 steps with a railing? : Total 6 Click Score: 12    End of Session   Activity Tolerance: Treatment limited secondary to medical complications (Comment) Patient left: with call bell/phone within reach;in bed;with bed alarm set   PT Visit Diagnosis: Other symptoms and signs involving the nervous system (X52.841)    Time: 1140-1155 PT Time Calculation (min) (ACUTE ONLY): 15 min   Charges:   PT Evaluation $PT Eval Low Complexity: 1 Low     PT G CodesKenyon Ana, PT Pager: [phone] 07/26/2017   [facility] 07/26/2017, 12:31 PM

## 2017-07-26 NOTE — ED Notes (Signed)
ED TO INPATIENT HANDOFF REPORT  Name/Age/Gender [facility] 47 y.o. female  Code Status    Code Status Orders  (From admission, onward)        Start     Ordered   07/25/17 2312  Full code  Continuous     07/25/17 2316    Code Status History    Date Active Date Inactive Code Status Order ID Comments User Context   04/30/2016 19:00 05/03/2016 17:02 Full Code [id number]  Truett Mainland, DO Inpatient   04/30/2016 17:47 04/30/2016 19:00 Full Code [id number]  Truett Mainland, DO ED      Home/SNF/Other Home  Chief Complaint Dizziness; Emesis; Hard to Walk  Level of Care/Admitting Diagnosis ED Disposition    ED Disposition Condition [place]: [place] [[id number]]  Level of Care: Telemetry [5]  Diagnosis: CVA (cerebral vascular accident) [facility]) [[id number]]  Admitting Physician: Norval Morton [[id number]]  Attending Physician: Norval Morton [[id number]]  Estimated length of stay: past midnight tomorrow  Certification:: I certify this patient will need inpatient services for at least 2 midnights  PT Class (Do Not Modify): Inpatient [101]  PT Acc Code (Do Not Modify): Private [1]       Medical History Past Medical History:  Diagnosis Date  . Asthma   . DDD (degenerative disc disease), cervical   . Diabetes mellitus ([place])   . Dislocated shoulder    right  . GERD (gastroesophageal reflux disease)   . Hypertension   . Neuropathy   . Stroke [facility])     Allergies Allergies  Allergen Reactions  . Lisinopril Swelling and Other (See Comments)    Facial swelling   . Penicillins Hives    Has patient had a PCN reaction causing immediate rash, facial/tongue/throat swelling, SOB or lightheadedness with hypotension: yes Has patient had a PCN reaction causing severe rash involving mucus membranes or skin necrosis: No Has patient had a PCN reaction that required hospitalization No Has patient had a PCN reaction occurring within the  last 10 years: No If all of the above answers are "NO", then may proceed wit  . Gabapentin Swelling and Other (See Comments)    "I thought I was having another stroke."     IV Location/Drains/Wounds Patient Lines/Drains/Airways Status   Active Line/Drains/Airways    None          Labs/Imaging Results for orders placed or performed during the hospital encounter of 07/25/17 (from the past 48 hour(s))  CBG monitoring, ED     Status: Abnormal   Collection Time: 07/25/17  1:50 PM  Result Value Ref Range   Glucose-Capillary 295 (H) 65 - 99 mg/dL  Urinalysis, Routine w reflex microscopic     Status: Abnormal   Collection Time: 07/25/17  3:48 PM  Result Value Ref Range   Color, Urine YELLOW YELLOW   APPearance CLEAR CLEAR   Specific Gravity, Urine 1.014 1.005 - 1.030   pH 5.0 5.0 - 8.0   Glucose, UA >=500 (A) NEGATIVE mg/dL   Hgb urine dipstick SMALL (A) NEGATIVE   Bilirubin Urine NEGATIVE NEGATIVE   Ketones, ur NEGATIVE NEGATIVE mg/dL   Protein, ur 30 (A) NEGATIVE mg/dL   Nitrite NEGATIVE NEGATIVE   Leukocytes, UA NEGATIVE NEGATIVE   RBC / HPF 0-5 0 - 5 RBC/hpf   WBC, UA 0-5 0 - 5 WBC/hpf   Bacteria, UA NONE SEEN NONE SEEN   Squamous Epithelial / LPF 0-5 (A) NONE SEEN  Mucus PRESENT     Comment: Performed at [facility], [place] [address], [place], [place] [address]  Basic metabolic panel     Status: Abnormal   Collection Time: 07/25/17  4:09 PM  Result Value Ref Range   Sodium 139 135 - 145 mmol/L   Potassium 4.1 3.5 - 5.1 mmol/L   Chloride 107 101 - 111 mmol/L   CO2 22 22 - 32 mmol/L   Glucose, Bld 345 (H) 65 - 99 mg/dL   BUN 34 (H) 6 - 20 mg/dL   Creatinine, Ser 1.80 (H) 0.44 - 1.00 mg/dL   Calcium 9.1 8.9 - 10.3 mg/dL   GFR calc non Af Amer 33 (L) >60 mL/min   GFR calc Af Amer 38 (L) >60 mL/min    Comment: (NOTE) The eGFR has been calculated using the CKD EPI equation. This calculation has not been validated in all clinical situations. eGFR's  persistently <60 mL/min signify possible Chronic Kidney Disease.    Anion gap 10 5 - 15    Comment: Performed at [facility], [place] [address], [place], [place] [address]  CBC     Status: Abnormal   Collection Time: 07/25/17  4:09 PM  Result Value Ref Range   WBC 10.1 4.0 - 10.5 K/uL   RBC 3.54 (L) 3.87 - 5.11 MIL/uL   Hemoglobin 10.2 (L) 12.0 - 15.0 g/dL   HCT 29.6 (L) 36.0 - 46.0 %   MCV 83.6 78.0 - 100.0 fL   MCH 28.8 26.0 - 34.0 pg   MCHC 34.5 30.0 - 36.0 g/dL   RDW 12.6 11.5 - 15.5 %   Platelets 260 150 - 400 K/uL    Comment: Performed at [facility], [place] [address], [place], [place] [address]  I-Stat beta hCG blood, ED     Status: None   Collection Time: 07/25/17  4:21 PM  Result Value Ref Range   I-stat hCG, quantitative <5.0 <5 mIU/mL   Comment 3            Comment:   GEST. AGE      CONC.  (mIU/mL)   <=1 WEEK        5 - 50     2 WEEKS       50 - 500     3 WEEKS       100 - 10,000     4 WEEKS     1,000 - 30,000        FEMALE AND NON-PREGNANT FEMALE:     LESS THAN 5 mIU/mL    Ct Head Wo Contrast  Result Date: 07/25/2017 CLINICAL DATA:  Dizziness.  Emesis.  Possible stroke. EXAM: CT HEAD WITHOUT CONTRAST TECHNIQUE: Contiguous axial images were obtained from the base of the skull through the vertex without intravenous contrast. COMPARISON:  Head CT 06/29/2017 Brain MRI 06/20/2016 FINDINGS: Brain: No mass lesion, intraparenchymal hemorrhage or extra-axial collection. No evidence of acute cortical infarct. Brain parenchyma and CSF-containing spaces are normal for age. Vascular: No hyperdense vessel or unexpected calcification. Skull: Normal calvarium and visualized skull base. Unchanged small right frontal scalp lipoma. Sinuses/Orbits: No sinus fluid levels or advanced mucosal thickening. No mastoid effusion. Normal orbits. IMPRESSION: Normal head CT. Electronically Signed   By: Ulyses Jarred M.D.   On: 07/25/2017 17:22   Mr Brain Wo  Contrast  Result Date: 07/25/2017 CLINICAL DATA:  Dizziness and vomiting EXAM: MRI HEAD WITHOUT CONTRAST TECHNIQUE: Multiplanar, multiecho pulse sequences of  the brain and surrounding structures were obtained without intravenous contrast. COMPARISON:  Head CT 07/25/2017 Brain MRI 07/02/2017 FINDINGS: Brain: Small focus of abnormal diffusion restriction within the superior left paramedian pons, measuring approximately 6 mm. No mass lesion, hydrocephalus, dural abnormality or extra-axial collection. There is age advanced multifocal hyperintense T2-weighted signal within the periventricular white matter, most often a result of chronic microvascular ischemia. No age-advanced or lobar predominant atrophy. Foci of chronic microhemorrhage are noted in the left caudate and right thalamus. Vascular: Major intracranial arterial and venous sinus flow voids are preserved. Skull and upper cervical spine: Right frontal scalp lipoma. Normal calvarium and skull base. Sinuses/Orbits: No fluid levels or advanced mucosal thickening. No mastoid or middle ear effusion. Normal orbits. IMPRESSION: 1. Small, 6 mm focus of acute ischemia within the left paramedian pons. 2. 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 Electronically Signed   By: Ulyses Jarred M.D.   On: 07/25/2017 20:29    Pending Labs Unresulted Labs (From admission, onward)   Start     Ordered   07/26/17 0500  HIV antibody (Routine Testing)  Tomorrow morning,   R     07/25/17 2316   07/26/17 0500  Hemoglobin A1c  Tomorrow morning,   R     07/25/17 2316   07/26/17 0500  Lipid panel  Tomorrow morning,   R    Comments:  Fasting    07/25/17 2316   07/26/17 0500  CBC  Tomorrow morning,   R     07/25/17 2316   07/26/17 0500  Comprehensive metabolic panel  Tomorrow morning,   R     07/25/17 2316   07/25/17 2348  Urine rapid drug screen (hosp performed)  Add-on,   R     07/25/17 2348       Vitals/Pain Today's Vitals   07/25/17 2214 07/25/17 2315 07/26/17 0000 07/26/17 0006  BP: (!) 146/88 122/64 123/76   Pulse: 78 84 82   Resp: 16   16  Temp:      TempSrc:      SpO2: 98%     PainSc:    Asleep    Isolation Precautions No active isolations  Medications Medications  doxazosin (CARDURA) tablet 4 mg (not administered)  carvedilol (COREG) tablet 25 mg (not administered)  atorvastatin (LIPITOR) tablet 40 mg (not administered)  aspirin EC tablet 325 mg (not administered)  amLODipine (NORVASC) tablet 5 mg (not administered)  insulin detemir (LEVEMIR) injection 30 Units (30 Units Subcutaneous Given 07/26/17 0009)   stroke: mapping our early stages of recovery book (not administered)  acetaminophen (TYLENOL) tablet 650 mg (not administered)    Or  acetaminophen (TYLENOL) solution 650 mg (not administered)    Or  acetaminophen (TYLENOL) suppository 650 mg (not administered)  senna-docusate (Senokot-S) tablet 1 tablet (not administered)  enoxaparin (LOVENOX) injection 40 mg (not administered)  0.9 %  sodium chloride infusion ( Intravenous New Bag/Given 07/26/17 0011)  albuterol (PROVENTIL) (2.5 MG/3ML) 0.083% nebulizer solution 2.5 mg (not administered)  insulin aspart (novoLOG) injection 0-20 Units (not administered)  insulin aspart (novoLOG) injection 0-5 Units (not administered)  sodium chloride 0.9 % bolus 1,000 mL (0 mLs Intravenous Stopped 07/25/17 2203)  meclizine (ANTIVERT) tablet 50 mg (50 mg Oral Given 07/25/17 1654)  LORazepam (ATIVAN) injection 1 mg (1 mg Intravenous Given 07/25/17 2140)  sodium chloride 0.9 % bolus 1,000 mL (0 mLs Intravenous Stopped 07/25/17 2342)    Mobility non-ambulatory

## 2017-07-26 NOTE — Progress Notes (Signed)
  Echocardiogram 2D Echocardiogram has been performed.  Christina Orozco Christina Orozco 07/26/2017, 2:09 PM

## 2017-07-26 NOTE — Progress Notes (Signed)
Verbal orders given by MD to RN for antivert prn for dizziness. MD out of office.

## 2017-07-26 NOTE — Consult Note (Addendum)
Requesting Physician: Janetta Hora [facility]    Chief Complaint: Dizziness  History obtained from: Patient and Chart     HPI:                                                                                                                                       Christina Orozco is an 47 y.o. female with past medical history of uncontrolled hypertension, uncontrolled diabetes mellitus, prior CVA presents with sudden onset dizziness on waking up his morning at 7 AM. Last known normal was 9:30 PM when she went to bed. She describes her dizziness as her feeling off balance. She denies any tinnitus, dysarthria, weakness in arm or leg. MRI brain was performed which showed a very small left pontine infarct. Neurology was consulted for further stroke workup. Patient is on aspirin 325 mg which which she is compliant since her last stroke in 2017.  Date last known well: 2.12.19 Time last known well:  9:30 PM tPA Given:  No outside window NIHSS:  0 Baseline MRS 0     Past Medical History:  Diagnosis Date  . Asthma   . DDD (degenerative disc disease), cervical   . Diabetes mellitus ([place])   . Dislocated shoulder    right  . GERD (gastroesophageal reflux disease)   . Hypertension   . Neuropathy   . Stroke [facility])     Past Surgical History:  Procedure Laterality Date  . ADENOIDECTOMY    . APPENDECTOMY    . CERVICAL SPINE SURGERY  06/2012   C5-C6  . EYE SURGERY     left eye surgery   . SHOULDER SURGERY    . TONSILLECTOMY      Family History  Problem Relation Age of Onset  . Vascular Disease Mother   . CAD Mother   . Heart failure Mother   . Heart disease Unknown   . Cancer Unknown        colon, stomach, lung  . Diabetes Unknown   . Seizures Unknown   . Breast cancer Sister 55   Social History:  reports that  has never smoked. she has never used smokeless tobacco. She reports that she does not drink alcohol or use drugs.  Allergies:  Allergies  Allergen Reactions  . Lisinopril  Swelling and Other (See Comments)    Facial swelling   . Penicillins Hives    Has patient had a PCN reaction causing immediate rash, facial/tongue/throat swelling, SOB or lightheadedness with hypotension: yes Has patient had a PCN reaction causing severe rash involving mucus membranes or skin necrosis: No Has patient had a PCN reaction that required hospitalization No Has patient had a PCN reaction occurring within the last 10 years: No If all of the above answers are "NO", then may proceed wit  . Gabapentin Swelling and Other (See Comments)    "I thought I was having another stroke."  Medications:                                                                                                                        I reviewed home medications. She is on aspirin and statin.   ROS:                                                                                                                                     14 systems reviewed and negative except above    Examination:                                                                                                      General: Appears well-developed and well-nourished.  Psych: Affect appropriate to situation Eyes: No scleral injection HENT: No OP obstrucion Head: Normocephalic.  Cardiovascular: Normal rate and regular rhythm.  Respiratory: Effort normal and breath sounds normal to anterior ascultation GI: Soft.  No distension. There is no tenderness.  Skin: WDI   Neurological Examination Mental Status: Alert, oriented, thought content appropriate.  Speech fluent without evidence of aphasia. Able to follow 3 step commands without difficulty. Cranial Nerves: II: Visual fields grossly normal,  III,IV, VI: ptosis not present, extra-ocular motions intact bilaterally, pupils equal, round, reactive to light and accommodation V,VII: smile symmetric, facial light touch sensation normal bilaterally VIII: hearing normal  bilaterally IX,X: uvula rises symmetrically XI: bilateral shoulder shrug XII: midline tongue extension Motor: Right : Upper extremity   5/5    Left:     Upper extremity   5/5  Lower extremity   5/5     Lower extremity   5/5 Tone and bulk:normal tone throughout; no atrophy noted Sensory: Pinprick and light touch intact throughout, bilaterally Deep Tendon Reflexes: 2+ and symmetric throughout Plantars: Right: downgoing   Left: downgoing Cerebellar: normal finger-to-nose, normal rapid alternating movements and normal heel-to-shin test Gait: normal gait and station     Lab Results: Basic Metabolic Panel: Recent Labs  Lab  07/19/17 0920 07/25/17 1609  NA 138 139  K 4.2 4.1  CL 103 107  CO2 22 22  GLUCOSE 166* 345*  BUN 44* 34*  CREATININE 1.97* 1.80*  CALCIUM 9.9 9.1    CBC: Recent Labs  Lab 07/25/17 1609  WBC 10.1  HGB 10.2*  HCT 29.6*  MCV 83.6  PLT 260    Coagulation Studies: No results for input(s): LABPROT, INR in the last 72 hours.  Imaging: Ct Head Wo Contrast  Result Date: 07/25/2017 CLINICAL DATA:  Dizziness.  Emesis.  Possible stroke. EXAM: CT HEAD WITHOUT CONTRAST TECHNIQUE: Contiguous axial images were obtained from the base of the skull through the vertex without intravenous contrast. COMPARISON:  Head CT 06/29/2017 Brain MRI 06/20/2016 FINDINGS: Brain: No mass lesion, intraparenchymal hemorrhage or extra-axial collection. No evidence of acute cortical infarct. Brain parenchyma and CSF-containing spaces are normal for age. Vascular: No hyperdense vessel or unexpected calcification. Skull: Normal calvarium and visualized skull base. Unchanged small right frontal scalp lipoma. Sinuses/Orbits: No sinus fluid levels or advanced mucosal thickening. No mastoid effusion. Normal orbits. IMPRESSION: Normal head CT. Electronically Signed   By: Ulyses Jarred M.D.   On: 07/25/2017 17:22   Mr Brain Wo Contrast  Result Date: 07/25/2017 CLINICAL DATA:  Dizziness and  vomiting EXAM: MRI HEAD WITHOUT CONTRAST TECHNIQUE: Multiplanar, multiecho pulse sequences of the brain and surrounding structures were obtained without intravenous contrast. COMPARISON:  Head CT 07/25/2017 Brain MRI 07/02/2017 FINDINGS: Brain: Small focus of abnormal diffusion restriction within the superior left paramedian pons, measuring approximately 6 mm. No mass lesion, hydrocephalus, dural abnormality or extra-axial collection. There is age advanced multifocal hyperintense T2-weighted signal within the periventricular white matter, most often a result of chronic microvascular ischemia. No age-advanced or lobar predominant atrophy. Foci of chronic microhemorrhage are noted in the left caudate and right thalamus. Vascular: Major intracranial arterial and venous sinus flow voids are preserved. Skull and upper cervical spine: Right frontal scalp lipoma. Normal calvarium and skull base. Sinuses/Orbits: No fluid levels or advanced mucosal thickening. No mastoid or middle ear effusion. Normal orbits. IMPRESSION: 1. Small, 6 mm focus of acute ischemia within the left paramedian pons. 2. 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 Electronically Signed   By: Ulyses Jarred M.D.   On: 07/25/2017 20:29     ASSESSMENT AND PLAN  47 y.o. 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 Etiology of her stroke is likely small vessel disease due to uncontrolled hypertension and diabetes.  Acute Ischemic Stroke   Etiology: Small vessel disease  Recommend # MRI of the brain without contrast #MRA Head and neck  #Transthoracic Echo  # Start patient on ASA 325mg  daily #Start or continue Atorvastatin 80 mg/other high intensity statin # BP goal: permissive HTN upto 220/100  # HBAIC and Lipid profile # Telemetry monitoring # Frequent neuro  checks # NPO until passes stroke swallow screen  Please page stroke NP  Or  PA  Or MD from 8am -4 pm  as this patient from this time will be  followed by the stroke.   You can look them up on [email or web address]  Password [facility]   Josafat Enrico [facility] Pager Number [phone]

## 2017-07-26 NOTE — Progress Notes (Signed)
OT Cancellation Note  Patient Details Name: Christina Orozco MRN: [id number] DOB: 1970-12-30   Cancelled Treatment:    Reason Eval/Treat Not Completed: Patient at procedure or test/ unavailable(having Echo)  [place], OT/L  [phone] 07/26/2017 07/26/2017, 1:55 PM

## 2017-07-26 NOTE — Progress Notes (Signed)
[facility]                                                                              Patient Demographics  Christina Orozco, is a 47 y.o. female, DOB - Jun 30, 1970, YCX:[id number]  Admit date - 07/25/2017   Admitting Physician Norval Morton, MD  Outpatient Primary MD for the patient is Dorena Dew, FNP  Outpatient specialists:   LOS - 1  days   Medical records reviewed and are as summarized below:    Chief Complaint  Patient presents with  . Dizziness       Brief summary   Patient is a 47 year old female with hypertension, diabetes mellitus type 2 on insulin, CVA, GERD presented with dizziness and difficulty walking since waking up on the morning of admission. Patient had a previous stroke in 2017 but denied any residual symptoms. MRI revealed small 6 mm focus of acute ischemia in the left paramedian pons.   Assessment & Plan    Principal Problem:   Acute CVA (cerebral vascular accident) ([place]) - MRI of the brain revealed 6 mm focus of acute ischemia in the left paramedian pons - Neurology consulted, recommended Plavix 75 mg daily, changed to aspirin 81 mg daily from tomorrow, received aspirin 325 mg daily - Follow up 2-D echo, MRA - Carotid Dopplers showed 1-39% ICA plaquing - Follow PT OT eval - Follow hemoglobin A1c, lipid panel, HIV   Active Problems:   Diabetes mellitus type 2, with complication, on long term insulin pump (HCC) - Uncontrolled, hemoglobin A1c pending - For now continue NovoLog 10 units 3 times a day with meals, Levemir 30 units twice a day with sliding scale insulin - Diabetic coordinator consult    Essential hypertension - BP currently stable, continue Norvasc, Coreg, Cardura    Hyperlipidemia - Follow lipid panel, continue Lipitor 40 mg daily   Code Status: Full CODE STATUS DVT Prophylaxis:  Lovenox  Family Communication: Discussed in detail with the patient, all imaging results, lab results explained to the  patient    Disposition Plan: Awaiting further workup, possibly in am   Time Spent in minutes   25 mins   Procedures:  MRI brain   Consultants:   Neurology   Antimicrobials:      Medications  Scheduled Meds: . amLODipine  5 mg Oral Daily  . aspirin EC  325 mg Oral Daily  . atorvastatin  40 mg Oral q1800  . carvedilol  25 mg Oral BID WC  . clopidogrel  75 mg Oral Daily  . doxazosin  4 mg Oral Daily  . enoxaparin (LOVENOX) injection  40 mg Subcutaneous Daily  . insulin aspart  0-20 Units Subcutaneous TID WC  . insulin aspart  0-5 Units Subcutaneous QHS  . insulin aspart  10 Units Subcutaneous TID WC  . insulin detemir  30 Units Subcutaneous BID   Continuous Infusions: . sodium chloride 100 mL/hr at 07/26/17 0618   PRN Meds:.acetaminophen **OR** acetaminophen (TYLENOL) oral liquid 160 mg/5 mL **OR** acetaminophen, albuterol, senna-docusate   Antibiotics   Anti-infectives (From admission, onward)   None  Subjective:   Christina Orozco was seen and examined today.  still feels dizzy.  Patient denies chest pain, shortness of breath, abdominal pain, N/V/D/C, new weakness, numbess, tingling.   Objective:   Vitals:   07/26/17 0612 07/26/17 0629 07/26/17 0934 07/26/17 0944  BP:  137/64 (!) 143/78 (!) 143/78  Pulse: 79 76  79  Resp: 16 16  19   Temp:  98.2 F (36.8 C)  98.6 F (37 C)  TempSrc: Oral Oral  Oral  SpO2:  100%  100%  Weight: 88.7 kg (195 lb 8.8 oz)     Height: 5' 3.6" (1.615 m)      No intake or output data in the 24 hours ending 07/26/17 1155   Wt Readings from Last 3 Encounters:  07/26/17 88.7 kg (195 lb 8.8 oz)  07/19/17 85.3 kg (188 lb)  07/09/17 84.9 kg (187 lb 3.2 oz)     Exam  General: Alert and oriented x 3, NAD, no dysarthria   Eyes:   HEENT:  Atraumatic, normocephalic, normal oropharynx  Cardiovascular: S1 S2 auscultated, no rubs, murmurs or gallops. Regular rate and rhythm.  Respiratory: Clear to auscultation  bilaterally, no wheezing, rales or rhonchi  Gastrointestinal: Soft, nontender, nondistended, + bowel sounds  Ext: no pedal edema bilaterally  Neuro: AAOx3, Cr N's II- XII. Strength 5/5 upper and lower extremities bilaterally, speech clear,   Musculoskeletal: No digital cyanosis, clubbing  Skin: No rashes  Psych: Normal affect and demeanor, alert and oriented x3    Data Reviewed:  I have personally reviewed following labs and imaging studies  Micro Results No results found for this or any previous visit (from the past 240 hour(s)).  Radiology Reports Ct Head Wo Contrast  Result Date: 07/25/2017 CLINICAL DATA:  Dizziness.  Emesis.  Possible stroke. EXAM: CT HEAD WITHOUT CONTRAST TECHNIQUE: Contiguous axial images were obtained from the base of the skull through the vertex without intravenous contrast. COMPARISON:  Head CT 06/29/2017 Brain MRI 06/20/2016 FINDINGS: Brain: No mass lesion, intraparenchymal hemorrhage or extra-axial collection. No evidence of acute cortical infarct. Brain parenchyma and CSF-containing spaces are normal for age. Vascular: No hyperdense vessel or unexpected calcification. Skull: Normal calvarium and visualized skull base. Unchanged small right frontal scalp lipoma. Sinuses/Orbits: No sinus fluid levels or advanced mucosal thickening. No mastoid effusion. Normal orbits. IMPRESSION: Normal head CT. Electronically Signed   By: Ulyses Jarred M.D.   On: 07/25/2017 17:22   Ct Head Wo Contrast  Result Date: 06/29/2017 CLINICAL DATA:  Pt c/o vision changes: no vision in left eye, acute onset this afternoon, right eye going blurry. Sts h/o stroke, HTN, DM EXAM: CT HEAD WITHOUT CONTRAST TECHNIQUE: Contiguous axial images were obtained from the base of the skull through the vertex without intravenous contrast. COMPARISON:  Brain MRI 06/20/2016 FINDINGS: Brain: No acute intracranial hemorrhage. No focal mass lesion. No CT evidence of acute infarction. No midline shift or mass  effect. No hydrocephalus. Basilar cisterns are patent. Vascular: No hyperdense vessel or unexpected calcification. Skull: Normal. Negative for fracture or focal lesion. Sinuses/Orbits: Paranasal sinuses and mastoid air cells are clear. Orbits are clear. Other: None. IMPRESSION: 1. No acute intracranial findings. 2. Subtle findings on comparison MRI are not apparent by CT. Electronically Signed   By: Suzy Bouchard M.D.   On: 06/29/2017 18:57   Mr Brain Wo Contrast  Result Date: 07/25/2017 CLINICAL DATA:  Dizziness and vomiting EXAM: MRI HEAD WITHOUT CONTRAST TECHNIQUE: Multiplanar, multiecho pulse sequences of the brain and surrounding structures  were obtained without intravenous contrast. COMPARISON:  Head CT 07/25/2017 Brain MRI 07/02/2017 FINDINGS: Brain: Small focus of abnormal diffusion restriction within the superior left paramedian pons, measuring approximately 6 mm. No mass lesion, hydrocephalus, dural abnormality or extra-axial collection. There is age advanced multifocal hyperintense T2-weighted signal within the periventricular white matter, most often a result of chronic microvascular ischemia. No age-advanced or lobar predominant atrophy. Foci of chronic microhemorrhage are noted in the left caudate and right thalamus. Vascular: Major intracranial arterial and venous sinus flow voids are preserved. Skull and upper cervical spine: Right frontal scalp lipoma. Normal calvarium and skull base. Sinuses/Orbits: No fluid levels or advanced mucosal thickening. No mastoid or middle ear effusion. Normal orbits. IMPRESSION: 1. Small, 6 mm focus of acute ischemia within the left paramedian pons. 2. 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 Electronically Signed   By: Ulyses Jarred M.D.   On: 07/25/2017 20:29   Mr Brain Wo Contrast  Result Date: 07/02/2017 CLINICAL DATA:  Initial evaluation for acute dizziness, nystagmus. EXAM: MRI HEAD WITHOUT CONTRAST  TECHNIQUE: Multiplanar, multiecho pulse sequences of the brain and surrounding structures were obtained without intravenous contrast. COMPARISON:  Prior MRI from 06/20/2016. FINDINGS: Brain: Cerebral volume within normal limits for age. Scattered patchy T2/FLAIR signal abnormality within the periventricular, deep, and subcortical white matter both cerebral hemispheres, felt to be most consistent with chronic small vessel ischemic changes, mildly progressed relative to 2018. Superimposed subcentimeter remote lacunar infarcts seen involving the ventral medial right thalamus and left caudate head. T2/FLAIR signal abnormality within the splenium is slightly decreased in prominence relative to previous exam. Similarly, previously seen T2/FLAIR signal abnormality within the central pons is also improved, consistent with improved/resolved sequelae of prior episode of osmotic demyelination. No abnormal foci of restricted diffusion to suggest acute or subacute ischemia identified. Minimal subtle diffusion abnormality seen at the cervicomedullary junction favored to reflect artifact and/or T2 shine through. Gray-white matter differentiation maintained. No other evidence for chronic infarction. No mass lesion, midline shift or mass effect. No hydrocephalus. No extra-axial fluid collection. Major dural sinuses are grossly patent. Pituitary gland suprasellar region normal. Midline structures intact and normal. Vascular: Major intracranial vascular flow voids are maintained. Skull and upper cervical spine: Craniocervical junction normal. Upper cervical spine normal. Bone marrow signal intensity within normal limits. No acute scalp soft tissue abnormality. Small lipoma noted at the right frontal scalp. Sinuses/Orbits: Globes and orbital soft tissues within normal limits. Paranasal sinuses are clear. No mastoid effusion. Inner ear structures normal. Other: None. IMPRESSION: 1. No acute intracranial abnormality. 2. Interval  improvement/resolution of previously seen signal changes involving the central pons related to prior episode of osmotic demyelination. 3. Additional patchy T2/FLAIR signal abnormality involving the cerebral white matter, most likely related to chronic small vessel ischemic changes, mildly progressed relative to 2018. 4. Superimposed subcentimeter remote lacunar infarcts within the left caudate and right thalamus. Electronically Signed   By: Jeannine Boga M.D.   On: 07/02/2017 18:50    Lab Data:  CBC: Recent Labs  Lab 07/25/17 1609  WBC 10.1  HGB 10.2*  HCT 29.6*  MCV 83.6  PLT 161   Basic Metabolic Panel: Recent Labs  Lab 07/25/17 1609  NA 139  K 4.1  CL 107  CO2 22  GLUCOSE 345*  BUN 34*  CREATININE 1.80*  CALCIUM 9.1   GFR: Estimated Creatinine Clearance: 42.7 mL/min (A) (by C-G formula based on SCr of 1.8 mg/dL (H)). Liver Function Tests:  No results for input(s): AST, ALT, ALKPHOS, BILITOT, PROT, ALBUMIN in the last 168 hours. No results for input(s): LIPASE, AMYLASE in the last 168 hours. No results for input(s): AMMONIA in the last 168 hours. Coagulation Profile: No results for input(s): INR, PROTIME in the last 168 hours. Cardiac Enzymes: No results for input(s): CKTOTAL, CKMB, CKMBINDEX, TROPONINI in the last 168 hours. BNP (last 3 results) No results for input(s): PROBNP in the last 8760 hours. HbA1C: No results for input(s): HGBA1C in the last 72 hours. CBG: Recent Labs  Lab 07/25/17 1350 07/26/17 0053 07/26/17 0637 07/26/17 1042  GLUCAP 295* 210* 148* 86   Lipid Profile: No results for input(s): CHOL, HDL, LDLCALC, TRIG, CHOLHDL, LDLDIRECT in the last 72 hours. Thyroid Function Tests: No results for input(s): TSH, T4TOTAL, FREET4, T3FREE, THYROIDAB in the last 72 hours. Anemia Panel: No results for input(s): VITAMINB12, FOLATE, FERRITIN, TIBC, IRON, RETICCTPCT in the last 72 hours. Urine analysis:    Component Value Date/Time   COLORURINE  YELLOW 07/25/2017 [place] 07/25/2017 1548   LABSPEC 1.014 07/25/2017 1548   PHURINE 5.0 07/25/2017 1548   GLUCOSEU >=500 (A) 07/25/2017 1548   HGBUR SMALL (A) 07/25/2017 1548   BILIRUBINUR NEGATIVE 07/25/2017 1548   KETONESUR NEGATIVE 07/25/2017 1548   PROTEINUR 30 (A) 07/25/2017 1548   UROBILINOGEN 0.2 04/23/2017 0920   NITRITE NEGATIVE 07/25/2017 1548   LEUKOCYTESUR NEGATIVE 07/25/2017 1548     Ripudeep Rai M.D. [facility] 07/26/2017, 11:55 AM  Pager: [phone] Between 7am to 7pm - call Pager - [phone]  After 7pm go to [email or web address] - password [email or web address]  Call night coverage person covering after 7pm

## 2017-07-27 LAB — GLUCOSE, CAPILLARY
GLUCOSE-CAPILLARY: 66 mg/dL (ref 65–99)
GLUCOSE-CAPILLARY: 104 mg/dL — AB (ref 65–99)
Glucose-Capillary: 132 mg/dL — ABNORMAL HIGH (ref 65–99)

## 2017-07-27 LAB — T4, FREE: Free T4: 0.99 ng/dL (ref 0.61–1.12)

## 2017-07-27 LAB — VITAMIN B12: Vitamin B-12: 437 pg/mL (ref 180–914)

## 2017-07-27 LAB — HIV ANTIBODY (ROUTINE TESTING W REFLEX): HIV Screen 4th Generation wRfx: NONREACTIVE

## 2017-07-27 LAB — TSH: TSH: 1.883 u[IU]/mL (ref 0.350–4.500)

## 2017-07-27 MED ORDER — CLOPIDOGREL BISULFATE 75 MG PO TABS: 75.0000 mg | ORAL_TABLET | Freq: Every day | ORAL | 4 refills | Status: DC

## 2017-07-27 MED ORDER — SCOPOLAMINE 1 MG/3DAYS TD PT72: 1.0000 | MEDICATED_PATCH | TRANSDERMAL | 0 refills | Status: DC

## 2017-07-27 MED ORDER — SCOPOLAMINE 1 MG/3DAYS TD PT72
1.0000 | MEDICATED_PATCH | TRANSDERMAL | Status: DC
  Administered 2017-07-27: 1.5 mg via TRANSDERMAL
  Filled 2017-07-27: qty 1

## 2017-07-27 MED ORDER — MECLIZINE HCL 12.5 MG PO TABS
25.0000 mg | ORAL_TABLET | Freq: Once | ORAL | Status: AC
  Administered 2017-07-27: 25 mg via ORAL
  Filled 2017-07-27: qty 2

## 2017-07-27 MED ORDER — INSULIN ASPART 100 UNIT/ML ~~LOC~~ SOLN
0.0000 [IU] | Freq: Three times a day (TID) | SUBCUTANEOUS | Status: DC
  Administered 2017-07-27: 1 [IU] via SUBCUTANEOUS

## 2017-07-27 MED ORDER — ASPIRIN EC 325 MG PO TBEC: 325.0000 mg | DELAYED_RELEASE_TABLET | Freq: Every day | ORAL | 3 refills | Status: DC

## 2017-07-27 MED ORDER — INSULIN ASPART 100 UNIT/ML ~~LOC~~ SOLN: 0.0000 [IU] | Freq: Every day | SUBCUTANEOUS | Status: DC

## 2017-07-27 MED ORDER — INSULIN ASPART 100 UNIT/ML ~~LOC~~ SOLN
8.0000 [IU] | Freq: Three times a day (TID) | SUBCUTANEOUS | Status: DC
  Administered 2017-07-27: 8 [IU] via SUBCUTANEOUS

## 2017-07-27 MED ORDER — INSULIN PEN NEEDLE 32G X 5 MM MISC: 3 refills | Status: DC

## 2017-07-27 MED ORDER — MECLIZINE HCL 25 MG PO TABS: 25.0000 mg | ORAL_TABLET | Freq: Three times a day (TID) | ORAL | 0 refills | Status: DC | PRN

## 2017-07-27 MED ORDER — INSULIN DETEMIR 100 UNIT/ML ~~LOC~~ SOLN
26.0000 [IU] | Freq: Two times a day (BID) | SUBCUTANEOUS | Status: DC
  Filled 2017-07-27: qty 0.26

## 2017-07-27 NOTE — Care Management Note (Signed)
Case Management Note  Patient Details  Name: Christina Orozco MRN: [id number] Date of Birth: September 09, 1970  Subjective/Objective:                    Action/Plan: Pt discharging home with orders for [facility] services. CM met with patient and [facility] selected. Hoyle Sauer with [place] accepted the referral.  Pt with orders for 3 in 1. Butch Penny with [facility] DME notified and will deliver to the room. Pt states her son will provide transportation home after 5 pm. CM provided her coupons to assist with the cost of her insulin.   Expected Discharge Date:  07/27/17               Expected Discharge Plan:  [place]  In-House Referral:     Discharge planning Services  CM Consult  Post Acute Care Choice:  Durable Medical Equipment, Home Health Choice offered to:  Patient  DME Arranged:  3-N-1 DME Agency:     HH Arranged:  PT, OT, Nurse's Aide [place] Agency:  [place]  Status of Service:  Completed, signed off  If discussed at [place] of Stay Meetings, dates discussed:    Additional Comments:  Pollie Friar, RN 07/27/2017, 12:23 PM

## 2017-07-27 NOTE — Progress Notes (Signed)
Occupational Therapy Evaluation Patient Details Name: Christina Orozco MRN: [id number] DOB: Jun 18, 1970 Today's Date: 07/27/2017    History of Present Illness Patient is a 47 year old female with hypertension, diabetes mellitus type 2 on insulin, CVA(without sequelae per pt), GERD; admitted 07/25/17 with dizziness and difficulty walking since waking up on the morning of admission.  MRI revealed small 6 mm focus of acute ischemia in the left paramedian pons.   Clinical Impression   PTA, pt was independent with ADL and mobility, worked doing data entry for dog shows and has a 53 yo and 34 yo at home. Pt states she does not have any support at DC. Pt was able to ambulate to the bathroom slowly taking 1 rest break and complete ADL with overall min A. Pt complaining of dizziness/room spinning and vertical diplopia which does not resolve when L eye is occluded. Discussed possibility of rehab prior to DC home, but pt states she can not go to rehab and has to go home so she can care for her child. Recommend [place] and [place] Aide and a 3in1 for safe DC home. Will follow acutely.    Follow Up Recommendations  Home health OT;Supervision - Intermittent;Other (comment)(home health Aide)    Equipment Recommendations  3 in 1 bedside commode    Recommendations for Other Services       Precautions / Restrictions Precautions Precautions: Fall;Other (comment) Precaution Comments: dizziness Restrictions Weight Bearing Restrictions: No      Mobility Bed Mobility Overal bed mobility: Needs Assistance Bed Mobility: Supine to Sit     Supine to sit: Supervision;HOB elevated     General bed mobility comments: OOB in chair  Transfers Overall transfer level: Needs assistance Equipment used: None Transfers: Sit to/from [employer] Transfers Sit to Stand: Min guard Stand pivot transfers: Min assist       General transfer comment: pivot steps bed to recliner; slow, guarded movement due to  dizziness    Balance Overall balance assessment: Needs assistance Sitting-balance support: No upper extremity supported;Feet supported Sitting balance-Leahy Scale: Good Sitting balance - Comments: pt is able to maintain static sit with UE support and close supervision, unable to test further d/t dizzness,pt feels as she is falling in varied directions, pt declined scooting to EOB to bring LEs to floor   Standing balance support: No upper extremity supported;During functional activity Standing balance-Leahy Scale: Poor                             ADL either performed or assessed with clinical judgement   ADL Overall ADL's : Needs assistance/impaired Eating/Feeding: Independent   Grooming: Set up   Upper Body Bathing: Set up;Sitting   Lower Body Bathing: Minimal assistance;Sit to/from stand   Upper Body Dressing : Set up;Sitting   Lower Body Dressing: Minimal assistance;Sit to/from stand   Toilet Transfer: Minimal assistance;RW;Ambulation;Comfort height toilet   Toileting- Clothing Manipulation and Hygiene: Sit to/from stand       Functional mobility during ADLs: Minimal assistance;Rolling walker;Cueing for safety General ADL Comments: Ambulated to BR with 1 rest break due to complaints of dizziness/room spinning; Began education on using a visual target to stabilize gaze when ambulating to reduce feelings of dizziness; began education of improtance of bringing feet up to her if possible instead of bending down toward floor     Vision Baseline Vision/History: Wears glasses Wears Glasses: Distance only Patient Visual Report: Blurring of vision;Other (comment) Vision Assessment?: Vision impaired- to  be further tested in functional context Additional Comments: States she lost the vision in her L eye 2 weeks ago and is following up with her eye doctor for this; pt complains of vertical diplopia in central vision. States the double vision remains constant when she  closes 1 eye; difficulty with reading smaller font(did not observe dignificatn difficulty with depth perception)     Perception Perception Comments: Did not observe alot of over/undershooting when reaching for objextsw during functional activities   Mason tested?: Within functional limits    Pertinent Vitals/Pain Pain Assessment: Faces Faces Pain Scale: Hurts a little bit Pain Location: general discomfort Pain Descriptors / Indicators: Discomfort Pain Intervention(s): Limited activity within patient's tolerance     Hand Dominance Right   Extremity/Trunk Assessment Upper Extremity Assessment Upper Extremity Assessment: LUE deficits/detail LUE Deficits / Details: strength WFL; states her armfeels "dull";using functionally LUE Sensation: (feels"dull") LUE Coordination: WNL   Lower Extremity Assessment Lower Extremity Assessment: Defer to PT evaluation   Cervical / Trunk Assessment Cervical / Trunk Assessment: Other exceptions(R bias when walkingat times)   Communication Communication Communication: No difficulties   Cognition Arousal/Alertness: Awake/alert Behavior During Therapy: Flat affect Overall Cognitive Status: Within Functional Limits for tasks assessed                                     General Comments       Exercises     Shoulder Instructions      Home Living Family/patient expects to be discharged to:: Private residence Living Arrangements: Children(21 and 8 yo; 57 yo is leaving for [facility]) Available Help at Discharge: Available PRN/intermittently Type of Home: House Home Access: Level entry     Home Layout: One level     Bathroom Shower/Tub: Tub/shower unit;Curtain   Biochemist, clinical: Standard Bathroom Accessibility: No   Home Equipment: Environmental consultant - 2 wheels;Grab bars - tub/shower          Prior Functioning/Environment Level of Independence: Independent        Comments: pt reports she used the RW for  approximately 48month after previous CVA, no more after that; works doing Data entry for dog shows        OT Problem List: Decreased activity tolerance;Impaired balance (sitting and/or standing);Impaired vision/perception;Decreased safety awareness      OT Treatment/Interventions: Self-care/ADL training;Therapeutic exercise;DME and/or AE instruction;Therapeutic activities;Neuromuscular education;Visual/perceptual remediation/compensation;Patient/family education;Balance training    OT Goals(Current goals can be found in the care plan section) Acute Rehab OT Goals Patient Stated Goal: to feel better OT Goal Formulation: With patient Time For Goal Achievement: 08/10/17 Potential to Achieve Goals: Good  OT Frequency: Min 3X/week   Barriers to D/C: Decreased caregiver support  concerned about the care for her 45 yo       Co-evaluation              AM-PAC PT "6 Clicks" Daily Activity     Outcome Measure Help from another person eating meals?: None Help from another person taking care of personal grooming?: A Little Help from another person toileting, which includes using toliet, bedpan, or urinal?: A Little Help from another person bathing (including washing, rinsing, drying)?: A Little Help from another person to put on and taking off regular upper body clothing?: None Help from another person to put on and taking off regular lower body clothing?: A Little 6 Click Score: 20   End of Session  Equipment Utilized During Treatment: Administrator, arts Communication: Mobility status  Activity Tolerance: Patient tolerated treatment well Patient left: in chair;with call bell/phone within reach  OT Visit Diagnosis: Unsteadiness on feet (R26.81);Other abnormalities of gait and mobility (R26.89);Low vision, both eyes (H54.2);Muscle weakness (generalized) (M62.81);Dizziness and giddiness (R42)                Time: 7741-2878 OT Time Calculation (min): 40 min Charges:  OT  General Charges $OT Visit: 1 Visit OT Evaluation $OT Eval Moderate Complexity: 1 Mod OT Treatments $Self Care/Home Management : 23-37 mins G-Codes:     [facility], OT/L  [phone] 07/27/2017  Shameka Aggarwal,HILLARY 07/27/2017, 12:21 PM

## 2017-07-27 NOTE — Progress Notes (Signed)
Physical Therapy Treatment Patient Details Name: Christina Orozco MRN: [id number] DOB: 21-Oct-1970 Today's Date: 07/27/2017    History of Present Illness Patient is a 47 year old female with hypertension, diabetes mellitus type 2 on insulin, CVA(without sequelae per pt), GERD; admitted 07/25/17 with dizziness and difficulty walking since waking up on the morning of admission.  MRI revealed small 6 mm focus of acute ischemia in the left paramedian pons.    PT Comments    Pt received in bed and agreeable to therapy. She reports she has not been OOB since admission and has been using a bed pan. Encouragement needed to participate in mobility. Pt reports limiting factor is dizziness but reports no nausea. Pt transferred bed to recliner with min guard assist. Slow, guarded movement noted. She reports having double vision. BP 153/77 sitting in recliner. Pt declining ambulation due to dizziness.  Follow Up Recommendations  Home health PT     Equipment Recommendations  None recommended by PT    Recommendations for Other Services       Precautions / Restrictions Precautions Precautions: Fall;Other (comment) Precaution Comments: dizziness    Mobility  Bed Mobility Overal bed mobility: Needs Assistance Bed Mobility: Supine to Sit     Supine to sit: Supervision;HOB elevated     General bed mobility comments: +rail, slow guarded movement  Transfers Overall transfer level: Needs assistance Equipment used: None Transfers: Sit to/from [facility] Sit to Stand: Min guard Stand pivot transfers: Min guard       General transfer comment: pivot steps bed to recliner; slow, guarded movement due to dizziness  Ambulation/Gait             General Gait Details: unable to progress due to dizziness   Stairs            Wheelchair Mobility    Modified Rankin (Stroke Patients Only) Modified Rankin (Stroke Patients Only) Pre-Morbid Rankin Score: No significant  disability Modified Rankin: Slight disability     Balance   Sitting-balance support: No upper extremity supported;Feet supported Sitting balance-Leahy Scale: Good     Standing balance support: No upper extremity supported;During functional activity Standing balance-Leahy Scale: Fair                              Cognition Arousal/Alertness: Awake/alert Behavior During Therapy: Flat affect Overall Cognitive Status: Within Functional Limits for tasks assessed                                        Exercises      General Comments        Pertinent Vitals/Pain Pain Assessment: No/denies pain    Home Living                      Prior Function            PT Goals (current goals can now be found in the care plan section) Acute Rehab PT Goals Patient Stated Goal: to feel better PT Goal Formulation: With patient Time For Goal Achievement: 08/09/17 Potential to Achieve Goals: Good Progress towards PT goals: Progressing toward goals    Frequency    Min 3X/week      PT Plan Current plan remains appropriate    Co-evaluation              AM-PAC PT "  6 Clicks" Daily Activity  Outcome Measure  Difficulty turning over in bed (including adjusting bedclothes, sheets and blankets)?: A Little Difficulty moving from lying on back to sitting on the side of the bed? : A Little Difficulty sitting down on and standing up from a chair with arms (e.g., wheelchair, bedside commode, etc,.)?: A Little Help needed moving to and from a bed to chair (including a wheelchair)?: A Little Help needed walking in hospital room?: A Little Help needed climbing 3-5 steps with a railing? : A Lot 6 Click Score: 17    End of Session Equipment Utilized During Treatment: Gait belt Activity Tolerance: Treatment limited secondary to medical complications (Comment)(dizziness) Patient left: in chair;with call bell/phone within reach Nurse Communication:  Mobility status PT Visit Diagnosis: Other symptoms and signs involving the nervous system (J58.727)     Time: 6184-8592 PT Time Calculation (min) (ACUTE ONLY): 16 min  Charges:  $Therapeutic Activity: 8-22 mins                    G Codes:       Lorrin Goodell, PT  Office # [phone] Pager [phone]    Lorriane Shire 07/27/2017, 10:05 AM

## 2017-07-27 NOTE — Discharge Summary (Signed)
Physician Discharge Summary   Patient ID: Christina Orozco MRN: [id number] DOB/AGE: 45-Feb-1972 45 y.o.  Admit date: 07/25/2017 Discharge date: 07/27/2017  Primary Care Physician:  Dorena Dew, FNP  Discharge Diagnoses:    . Acute CVA (cerebral vascular accident) ([place]) . Hyperlipidemia . Essential hypertension Insulin-dependent diabetes mellitus   Consults: Neurology  Recommendations for Outpatient Follow-up:  1. Home health PT OT, home health aide, RN to be arranged by case management 2. Please repeat CBC/BMET at next visit  DIET: Carb modified diet    Allergies:   Allergies  Allergen Reactions  . Lisinopril Swelling and Other (See Comments)    Facial swelling   . Penicillins Hives    Has patient had a PCN reaction causing immediate rash, facial/tongue/throat swelling, SOB or lightheadedness with hypotension: yes Has patient had a PCN reaction causing severe rash involving mucus membranes or skin necrosis: No Has patient had a PCN reaction that required hospitalization No Has patient had a PCN reaction occurring within the last 10 years: No If all of the above answers are "NO", then may proceed wit  . Gabapentin Swelling and Other (See Comments)    "I thought I was having another stroke."      DISCHARGE MEDICATIONS: Allergies as of 07/27/2017      Reactions   Lisinopril Swelling, Other (See Comments)   Facial swelling    Penicillins Hives   Has patient had a PCN reaction causing immediate rash, facial/tongue/throat swelling, SOB or lightheadedness with hypotension: yes Has patient had a PCN reaction causing severe rash involving mucus membranes or skin necrosis: No Has patient had a PCN reaction that required hospitalization No Has patient had a PCN reaction occurring within the last 10 years: No If all of the above answers are "NO", then may proceed wit   Gabapentin Swelling, Other (See Comments)   "I thought I was having another stroke."        Medication List    TAKE these medications   albuterol 108 (90 Base) MCG/ACT inhaler Commonly known as:  PROVENTIL HFA;VENTOLIN HFA Inhale 1-2 puffs into the lungs every 6 (six) hours as needed for wheezing or shortness of breath.   amLODipine 5 MG tablet Commonly known as:  NORVASC Take 1 tablet (5 mg total) by mouth daily.   aspirin EC 325 MG tablet Take 1 tablet (325 mg total) by mouth daily.   atorvastatin 40 MG tablet Commonly known as:  LIPITOR Take 1 tablet (40 mg total) by mouth daily at 6 PM.   carvedilol 25 MG tablet Commonly known as:  COREG Take 1 tablet (25 mg total) by mouth 2 (two) times daily with a meal. Take 25 mg twice daily   clopidogrel 75 MG tablet Commonly known as:  PLAVIX Take 1 tablet (75 mg total) by mouth daily. Start taking on:  07/28/2017   doxazosin 4 MG tablet Commonly known as:  CARDURA Take 1 tablet (4 mg total) by mouth daily.   hydrochlorothiazide 12.5 MG tablet Commonly known as:  HYDRODIURIL Take 12.5 mg by mouth daily.   insulin aspart 100 UNIT/ML FlexPen Commonly known as:  NOVOLOG Inject 10 Units into the skin 3 (three) times daily with meals.   Insulin Detemir 100 UNIT/ML Pen Commonly known as:  LEVEMIR FLEXTOUCH 30 units in am and 20 units pm What changed:    how much to take  how to take this  when to take this  additional instructions   Insulin Pen Needle 32G X 5  MM Misc For Insulin Flex Pens   meclizine 25 MG tablet Commonly known as:  ANTIVERT Take 1 tablet (25 mg total) by mouth every 8 (eight) hours as needed for dizziness (For dizziness).   scopolamine 1 MG/3DAYS Commonly known as:  TRANSDERM-SCOP Place 1 patch (1.5 mg total) onto the skin every 3 (three) days. Start taking on:  07/30/2017            Durable Medical Equipment  (From admission, onward)        Start     Ordered   07/27/17 1217  For home use only DME 3 n 1  Once     07/27/17 1217       Brief H and P: For complete details  please refer to admission H and P, but in brief Patient is a 47 year old female with hypertension, diabetes mellitus type 2 on insulin, CVA, GERD presented with dizziness and difficulty walking since waking up on the morning of admission. Patient had a previous stroke in 2017 but denied any residual symptoms. MRI revealed small 6 mm focus of acute ischemia in the left paramedian pons.   Hospital Course:   Acute CVA (cerebral vascular accident) ([place]) - MRI of the brain revealed 6 mm focus of acute ischemia in the left paramedian pons -MRA showed normal noncontrast MRA head, no large vessel occlusion, flow-limiting stenosis or aneurysm. - Neurology consulted, recommended Plavix 75 mg daily and aspirin 81 mg daily  - Carotid Dopplers showed 1-39% ICA plaquing -2D echo showed EF of 65-70% with grade 1 diastolic dysfunction, no significant changes compared to prior study in 6/18 -PT evaluation recommended home health PT OT, to be arranged by the case management -LDL 43 -Hemoglobin A1c 12.5, TSH 1.8     Diabetes mellitus type 2, with complication, on long term insulin pump (HCC) - Uncontrolled, hemoglobin A1c 12.5 - continue NovoLog 10 units 3 times a day with meals, Levemir per outpatient regimen, recommended strongly to follow-up with PCP for insulin regimen titration.    Essential hypertension - BP currently stable, continue Norvasc, Coreg, Cardura    Hyperlipidemia -LDL 43, continue Lipitor 40 mg daily     Day of Discharge BP (!) 153/77 (BP Location: Right Arm)   Pulse 80   Temp 98.3 F (36.8 C) (Oral)   Resp 16   Ht 5' 3.6" (1.615 m)   Wt 88.7 kg (195 lb 8.8 oz)   LMP 06/11/2017   SpO2 100%   BMI 33.99 kg/m   Physical Exam: General: Alert and awake oriented x3, NAD, flat affect. HEENT: anicteric sclera, pupils reactive to light and accommodation CVS: S1-S2 clear no murmur rubs or gallops Chest: clear to auscultation bilaterally, no wheezing rales or rhonchi Abdomen: soft  nontender, nondistended, normal bowel sounds Extremities: no cyanosis, clubbing or edema noted bilaterally Neuro: Cranial nerves II-XII intact, no focal neurological deficits   The results of significant diagnostics from this hospitalization (including imaging, microbiology, ancillary and laboratory) are listed below for reference.    LAB RESULTS: Basic Metabolic Panel: Recent Labs  Lab 07/25/17 1609 07/26/17 1118  NA 139 142  K 4.1 3.6  CL 107 113*  CO2 22 21*  GLUCOSE 345* 104*  BUN 34* 23*  CREATININE 1.80* 1.60*  CALCIUM 9.1 8.8*   Liver Function Tests: Recent Labs  Lab 07/26/17 1118  AST 16  ALT 11*  ALKPHOS 59  BILITOT 0.5  PROT 6.5  ALBUMIN 3.0*   No results for input(s): LIPASE, AMYLASE in  the last 168 hours. No results for input(s): AMMONIA in the last 168 hours. CBC: Recent Labs  Lab 07/25/17 1609 07/26/17 1118  WBC 10.1 7.3  HGB 10.2* 9.8*  HCT 29.6* 30.0*  MCV 83.6 85.2  PLT 260 259   Cardiac Enzymes: No results for input(s): CKTOTAL, CKMB, CKMBINDEX, TROPONINI in the last 168 hours. BNP: Invalid input(s): POCBNP CBG: Recent Labs  Lab 07/27/17 0639 07/27/17 1204  GLUCAP 104* 132*    Significant Diagnostic Studies:  Ct Head Wo Contrast  Result Date: 07/25/2017 CLINICAL DATA:  Dizziness.  Emesis.  Possible stroke. EXAM: CT HEAD WITHOUT CONTRAST TECHNIQUE: Contiguous axial images were obtained from the base of the skull through the vertex without intravenous contrast. COMPARISON:  Head CT 06/29/2017 Brain MRI 06/20/2016 FINDINGS: Brain: No mass lesion, intraparenchymal hemorrhage or extra-axial collection. No evidence of acute cortical infarct. Brain parenchyma and CSF-containing spaces are normal for age. Vascular: No hyperdense vessel or unexpected calcification. Skull: Normal calvarium and visualized skull base. Unchanged small right frontal scalp lipoma. Sinuses/Orbits: No sinus fluid levels or advanced mucosal thickening. No mastoid effusion.  Normal orbits. IMPRESSION: Normal head CT. Electronically Signed   By: Ulyses Jarred M.D.   On: 07/25/2017 17:22   Mr Jodene Nam Head Wo Contrast  Result Date: 07/26/2017 CLINICAL DATA:  Dizziness and difficulty ambulating since hospital admission. History of stroke. Follow-up pontine infarct. EXAM: MRA HEAD WITHOUT CONTRAST TECHNIQUE: Angiographic images of the Circle of Willis were obtained using MRA technique without intravenous contrast. COMPARISON:  MRI of the head July 25, 2017 FINDINGS: ANTERIOR CIRCULATION: Normal flow related enhancement of the included cervical, petrous, cavernous and supraclinoid internal carotid arteries. Patent anterior communicating artery. Patent anterior and middle cerebral arteries, including distal segments. No large vessel occlusion, flow limiting stenosis, aneurysm. POSTERIOR CIRCULATION: LEFT vertebral artery is dominant. Basilar artery is patent, with normal flow related enhancement of the main branch vessels. Patent posterior cerebral arteries. No large vessel occlusion, flow limiting stenosis,  aneurysm. ANATOMIC VARIANTS: None. Source images and MIP images were reviewed. IMPRESSION: Normal noncontrast MRA head. Electronically Signed   By: Elon Alas M.D.   On: 07/26/2017 23:18   Mr Brain Wo Contrast  Result Date: 07/25/2017 CLINICAL DATA:  Dizziness and vomiting EXAM: MRI HEAD WITHOUT CONTRAST TECHNIQUE: Multiplanar, multiecho pulse sequences of the brain and surrounding structures were obtained without intravenous contrast. COMPARISON:  Head CT 07/25/2017 Brain MRI 07/02/2017 FINDINGS: Brain: Small focus of abnormal diffusion restriction within the superior left paramedian pons, measuring approximately 6 mm. No mass lesion, hydrocephalus, dural abnormality or extra-axial collection. There is age advanced multifocal hyperintense T2-weighted signal within the periventricular white matter, most often a result of chronic microvascular ischemia. No age-advanced or  lobar predominant atrophy. Foci of chronic microhemorrhage are noted in the left caudate and right thalamus. Vascular: Major intracranial arterial and venous sinus flow voids are preserved. Skull and upper cervical spine: Right frontal scalp lipoma. Normal calvarium and skull base. Sinuses/Orbits: No fluid levels or advanced mucosal thickening. No mastoid or middle ear effusion. Normal orbits. IMPRESSION: 1. Small, 6 mm focus of acute ischemia within the left paramedian pons. 2. 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 Electronically Signed   By: Ulyses Jarred M.D.   On: 07/25/2017 20:29    2D ECHO: Study Conclusions  - Left ventricle: The cavity size was normal. Wall thickness was   increased in a pattern of severe LVH. Systolic function was   vigorous. The estimated  ejection fraction was in the range of 65%   to 70%. Wall motion was normal; there were no regional wall   motion abnormalities. Doppler parameters are consistent with   abnormal left ventricular relaxation (grade 1 diastolic   dysfunction). The E/e&' ratio is between 8-15, suggesting   indeterminate LV filling pressure. - Mitral valve: Mildly thickened leaflets . There was mild   regurgitation. - Left atrium: The atrium was normal in size. - Inferior vena cava: The vessel was normal in size. The   respirophasic diameter changes were in the normal range (>= 50%),   consistent with normal central venous pressure.   Disposition and Follow-up: Discharge Instructions    Ambulatory referral to Neurology   Complete by:  As directed    An appointment is requested in approximately: 6 weeks   Diet Carb Modified   Complete by:  As directed    Increase activity slowly   Complete by:  As directed        DISPOSITION: Home with home health   [place]    Melvenia Beam, MD. Schedule an appointment as soon as possible for a visit in 6  week(s).   Specialty:  Neurology Contact information: [place] [address] [phone]        Dorena Dew, FNP. Schedule an appointment as soon as possible for a visit in 2 week(s).   Specialty:  Family Medicine Contact information: [place]. [place] [address] [phone]        Skeet Latch, MD Follow up.   Specialty:  Cardiology Contact information: [address] [place] [address] [phone]        [facility], [facility] Follow up.   Specialty:  [place] Why:  they will contact you for the first appointment Contact information: [place] [address] [phone]            Time spent on Discharge: 25 minutes  Signed:   Estill Cotta M.D. [facility] 07/27/2017, 12:43 PM Pager: [phone]

## 2017-07-27 NOTE — Progress Notes (Signed)
CBG 66.  Asymptomatic. Administered juice. Recheck 104. Dr. Kennon Holter made aware.

## 2017-07-27 NOTE — Progress Notes (Addendum)
NEUROHOSPITALISTS STROKE TEAM - DAILY PROGRESS NOTE   SUBJECTIVE (INTERVAL HISTORY) No family is at the bedside. Patient is found laying in bed in NAD. Overall she feels her condition is gradually improving. Voices no new complaints. No new events reported overnight. Walked with PT today. Likely d/c home today   OBJECTIVE Lab Results: CBC:  Recent Labs  Lab 07/25/17 1609 07/26/17 1118  WBC 10.1 7.3  HGB 10.2* 9.8*  HCT 29.6* 30.0*  MCV 83.6 85.2  PLT 260 259   BMP: Recent Labs  Lab 07/25/17 1609 07/26/17 1118  NA 139 142  K 4.1 3.6  CL 107 113*  CO2 22 21*  GLUCOSE 345* 104*  BUN 34* 23*  CREATININE 1.80* 1.60*  CALCIUM 9.1 8.8*   Liver Function Tests:  Recent Labs  Lab 07/26/17 1118  AST 16  ALT 11*  ALKPHOS 59  BILITOT 0.5  PROT 6.5  ALBUMIN 3.0*   Urinalysis:  Recent Labs  Lab 07/25/17 [place]  LABSPEC 1.014  PHURINE 5.0  GLUCOSEU >=500*  HGBUR SMALL*  BILIRUBINUR NEGATIVE  KETONESUR NEGATIVE  PROTEINUR 30*  NITRITE NEGATIVE  LEUKOCYTESUR NEGATIVE   Urine Drug Screen:     Component Value Date/Time   LABOPIA NONE DETECTED 07/25/2017 1548   COCAINSCRNUR NONE DETECTED 07/25/2017 1548   LABBENZ NONE DETECTED 07/25/2017 1548   AMPHETMU NONE DETECTED 07/25/2017 1548   THCU NONE DETECTED 07/25/2017 1548   LABBARB NONE DETECTED 07/25/2017 1548    Alcohol Level: No results for input(s): ETH in the last 168 hours.  PHYSICAL EXAM Temp:  [97.9 F (36.6 C)-98.8 F (37.1 C)] 98.3 F (36.8 C) (02/15 0927) Pulse Rate:  [77-86] 80 (02/15 0927) Resp:  [16-18] 16 (02/15 0927) BP: (133-153)/(66-82) 153/77 (02/15 0927) SpO2:  [98 %-100 %] 100 % (02/15 0927) General - Well nourished, well developed, in no apparent distress HEENT-  Normocephalic,  Cardiovascular - Regular rate and rhythm  Respiratory - Lungs clear bilaterally. No wheezing. Abdomen - soft and  non-tender, BS normal Extremities- no edema or cyanosis Mental Status: Alert, oriented, thought content appropriate.  Speech fluent without evidence of aphasia. Able to follow 3 step commands without difficulty. Cranial Nerves: II: Visual fields grossly normal,  III,IV, VI: ptosis not present, extra-ocular motions intact bilaterally, pupils equal, round, reactive to light and accommodation V,VII: smile symmetric, facial light touch sensation normal bilaterally VIII: hearing normal bilaterally IX,X: uvula rises symmetrically XI: bilateral shoulder shrug XII: midline tongue extension Motor: Right : Upper extremity   5/5    Left:     Upper extremity   5/5  Lower extremity   5/5     Lower extremity   5/5 Tone and bulk:normal tone throughout; no atrophy noted Sensory: Pinprick and light touch intact throughout, bilaterally, slightly hypersensitive on right Deep Tendon Reflexes: 2+ and symmetric throughout Plantars: Right: downgoing   Left: downgoing Cerebellar: normal finger-to-nose, normal rapid alternating movements and normal heel-to-shin test Gait: normal gait and station  IMAGING: I have personally reviewed the radiological images below and agree with the radiology interpretations. Ct Head Wo Contrast Result Date: 07/25/2017 IMPRESSION: Normal head CT. Electronically Signed   By: Ulyses Jarred M.D.   On: 07/25/2017 17:22   Mr Brain Wo Contrast Result Date: 07/25/2017 IMPRESSION: 1. Small, 6 mm focus of acute ischemia within the left paramedian pons. 2. 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 Electronically Signed   By: Lennette Bihari  [place] M.D.   On: 07/25/2017 20:29   MRA HEAD WITHOUT CONTRAST 07/26/2017 23:18 IMPRESSION: Normal noncontrast MRA head  Echocardiogram:                       Study Conclusions  - Left ventricle: The cavity size was normal. Wall thickness was   increased in a pattern of severe LVH. Systolic function  was   vigorous. The estimated ejection fraction was in the range of 65%   to 70%. Wall motion was normal; there were no regional wall   motion abnormalities. Doppler parameters are consistent with   abnormal left ventricular relaxation (grade 1 diastolic   dysfunction). The E/e&' ratio is between 8-15, suggesting   indeterminate LV filling pressure. - Mitral valve: Mildly thickened leaflets . There was mild   regurgitation. - Left atrium: The atrium was normal in size. - Inferior vena cava: The vessel was normal in size. The   respirophasic diameter changes were in the normal range (>= 50%),   consistent with normal central venous pressure.  Impressions:  - Compared to a prior study in 11/2016, there are no significant   changes. 11/2016- Severe LVH, EF 65-70% Grade 1 DD  B/L Carotid U/S:1-39% ICA plaquing. 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  Patient had 1 week history of dysarthria and gait imbalance in 04/2016.  MRI showed pontine diffusion restriction.  Patient glucose was high  at that time and corrected quickly.  Her symptoms and MRI imaging consistent with CPM.  MRA negative.  Repeat MRI brain in 06/2016, CPM resolved.  She follows with Dr. Jaynee Eagles at [facility].   On this admission, MRI showed left pontine small infarct.  EF 65-70%.  Carotid Doppler unremarkable.  MRA head Negative.  LDL 43 and A1c 12.5.  Patient stated that she has financial difficulties, not able to check BP at home, not checking glucose at home.  His currently on charity program for insulin.  She is on aspirin 325 and Lipitor 40 at home.  Now started during the platelet for minor stroke treatment.  Continue aspirin and Plavix for 3 weeks and then Plavix alone.  Continue Lipitor 40.Marland Kitchen   Small, 6 mm focus of acute ischemia within the left paramedian  pons  Suspected Etiology: small vessel disease Resultant Symptoms: Dizziness Stroke Risk Factors: diabetes mellitus, hyperlipidemia and hypertension Other Stroke Risk Factors: Obesity, Body mass index is 33.99 kg/m. , Hx stroke  Outstanding Stroke Work-up Studies:    Work up completed  07/27/2017 ASSESSMENT:   Neuro exam stable. +dizziness and slight numbness on Right side but improved from yesterday. Imaging and work up reviewed. Reinforced importance of medication compliance and disease management. Will need close PCP follow up. Neurology follow up in 6 weeks  PLAN  07/27/2017: Continue Aspirin/ Plavix/ Statin Frequent neuro checks Telemetry monitoring PT/OT/SLP Consult PM & Rehab Consult Case Management /MSW Ongoing aggressive stroke risk factor management Patient counseled to be compliant with her antithrombotic medications Patient counseled on Lifestyle modifications including, Diet, Exercise, and Stress Follow up with [facility] in 6 weeks  HX OF STROKES: 04/2016- No residual deficits  HYPERTENSION: Stable Permissive hypertension (OK if <220/120) for 24-48 hours post stroke and then gradually normalized within 5-7 days. Long term BP goal normotensive. May slowly restart home B/P medications after 48 hours Home  Meds: Multiple B/P meds  HYPERLIPIDEMIA:    Component Value Date/Time   CHOL 99 07/26/2017 1118   TRIG 70 07/26/2017 1118   HDL 42 07/26/2017 1118   CHOLHDL 2.4 07/26/2017 1118   VLDL 14 07/26/2017 1118   LDLCALC 43 07/26/2017 1118  Home Meds:  Lipitor 40 mg LDL  goal < 70 Continued on  Lipitor to 40 mg daily Continue statin at discharge  DIABETES: Lab Results  Component Value Date   HGBA1C 12.5 (H) 07/26/2017  HgbA1c goal < 7.0 Currently on: Novolog Continue CBG monitoring and SSI to maintain glucose 140-180 mg/dl DM education   OBESITY Obesity, Body mass index is 33.99 kg/m. Greater than/equal to 30  Other Active  Problems: Principal Problem:   CVA (cerebral vascular accident) ([place]) Active Problems:   Diabetes mellitus type 2, with complication, on long term insulin pump [facility])   Essential hypertension   Ataxia   Hyperlipidemia    Hospital day # 2 VTE prophylaxis: Lovenox  Diet : Fall precautions Diet heart healthy/carb modified Room service appropriate? Yes; Fluid consistency: Thin Diet Carb Modified   FAMILY UPDATES: No family at bedside  TEAM UPDATES: Rai, Vernelle Emerald, MD   Prior Home Stroke Medications:  aspirin 325 mg daily  Discharge Stroke Meds:  Please discharge patient on aspirin 325 mg daily and clopidogrel 75 mg daily x 3 weeks, than Plavix alone  Disposition: 07-Left Against Medical Advice/Left Without Being Seen/Elopement Therapy Recs:               HOME HH Follow Up:  Follow-up Information    Melvenia Beam, MD. Schedule an appointment as soon as possible for a visit in 6 week(s).   Specialty:  Neurology Contact information: [place] [address] [phone]        Dorena Dew, FNP. Schedule an appointment as soon as possible for a visit in 2 week(s).   Specialty:  Family Medicine Contact information: [address]. [place] [address] [phone]        Skeet Latch, MD Follow up.   Specialty:  Cardiology Contact information: [address] [place] [address] [phone]        Care, [facility] Follow up.   Specialty:  [place] Why:  they will contact you for the first appointment Contact information: [place] [address] [phone]          Dorena Dew, FNP -PCP Follow up in 1-2 weeks      Assessment & plan discussed with with attending physician and they are in agreement.    Renie Ora Stroke Neurology Team 07/27/2017 12:55 PM  Attending note: I reviewed above note and agree with the assessment and plan. I have made any  additions or clarifications directly to the above note. Pt was seen and examined.   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  Patient had 1 week history of dysarthria and gait imbalance in 04/2016.  MRI showed pontine diffusion restriction.  Patient glucose was high  at that time and corrected quickly.  Her symptoms and MRI imaging consistent with CPM.  MRA negative.  Repeat MRI brain in 06/2016, CPM resolved.  She follows with Dr. Jaynee Eagles at [facility].   On this admission, MRI showed left pontine small infarct.  EF 65-70%.  Carotid Doppler unremarkable.  MRA  head unremarkable.  LDL 43 and A1c 12.5.  Patient stated that she has financial difficulties, not able to check BP at home, not checking glucose at home.  His currently on charity program for insulin.  She is on aspirin 325 and Lipitor 40 at home.  Now started during the platelet for minor stroke treatment.  Continue aspirin and Plavix for 3 weeks and then Plavix alone.  Continue Lipitor 40. TSH, FT4 and B12 WNL.   Neurology will sign off. Please call with questions. Pt will follow up with Dr. Jaynee Eagles at [facility] in about 6 weeks. Thanks for the consult.  [name], MD PhD Stroke Neurology 07/27/2017 4:22 PM  To contact Stroke Continuity provider, please refer to [email or web address] After hours, contact General Neurology

## 2017-07-27 NOTE — Progress Notes (Signed)
Nurse went over discharge with patient and family. Patient and family verbalized understanding of discharge. All questions and concerns addressed. Discharging home with all belongings. Taken down in a wheelchair.

## 2017-07-31 LAB — GLUCOSE, CAPILLARY: GLUCOSE-CAPILLARY: 125 mg/dL — AB (ref 65–99)

## 2017-08-03 ENCOUNTER — Other Ambulatory Visit: Payer: Self-pay | Admitting: *Deleted

## 2017-08-03 ENCOUNTER — Telehealth: Payer: Self-pay | Admitting: Family Medicine

## 2017-08-03 ENCOUNTER — Ambulatory Visit (INDEPENDENT_AMBULATORY_CARE_PROVIDER_SITE_OTHER): Payer: BLUE CROSS/BLUE SHIELD | Admitting: Family Medicine

## 2017-08-03 ENCOUNTER — Encounter: Payer: Self-pay | Admitting: Family Medicine

## 2017-08-03 VITALS — BP 134/69 | HR 77 | Temp 98.4°F | Resp 16 | Ht 62.5 in | Wt 198.0 lb

## 2017-08-03 DIAGNOSIS — E162 Hypoglycemia, unspecified: Secondary | ICD-10-CM | POA: Diagnosis not present

## 2017-08-03 DIAGNOSIS — D649 Anemia, unspecified: Secondary | ICD-10-CM

## 2017-08-03 DIAGNOSIS — R42 Dizziness and giddiness: Secondary | ICD-10-CM | POA: Diagnosis not present

## 2017-08-03 DIAGNOSIS — N183 Chronic kidney disease, stage 3 unspecified: Secondary | ICD-10-CM

## 2017-08-03 DIAGNOSIS — H8190 Unspecified disorder of vestibular function, unspecified ear: Secondary | ICD-10-CM

## 2017-08-03 DIAGNOSIS — Z8673 Personal history of transient ischemic attack (TIA), and cerebral infarction without residual deficits: Secondary | ICD-10-CM | POA: Diagnosis not present

## 2017-08-03 DIAGNOSIS — E11649 Type 2 diabetes mellitus with hypoglycemia without coma: Secondary | ICD-10-CM | POA: Diagnosis not present

## 2017-08-03 LAB — GLUCOSE, CAPILLARY
Glucose-Capillary: 59 mg/dL — ABNORMAL LOW (ref 65–99)
Glucose-Capillary: 80 mg/dL (ref 65–99)

## 2017-08-03 MED ORDER — INSULIN DETEMIR 100 UNIT/ML FLEXPEN: PEN_INJECTOR | SUBCUTANEOUS | 11 refills | Status: DC

## 2017-08-03 MED ORDER — MECLIZINE HCL 25 MG PO TABS: 25.0000 mg | ORAL_TABLET | Freq: Three times a day (TID) | ORAL | 0 refills | Status: DC | PRN

## 2017-08-03 NOTE — Telephone Encounter (Signed)
[employer] called to get order Home nursing frequency approve  1week 1  2weeks 2  3weekl 3 for medication education, Please follow up

## 2017-08-03 NOTE — Patient Instructions (Signed)
Your blood sugar improved to 80 after 2 glucose tablets.  Will send a referral to endocrinology due to uncontrolled diabetes mellitus.   [place], scopolomine patch is $60, will start Meclizine 25 mg three times per day. Follow up in neurology as scheduled, will attempt to schedule a sooner appointment.   You will return to work upon neurology release.

## 2017-08-03 NOTE — Patient Outreach (Signed)
[place]) Care Management  08/03/2017  Christina Orozco [birth year] [id number]   EMMI-Stroke referral-Red Alert-Day #3 on 08/02/2017; Reason: questions/problems with meds  Per hx: Admission 2/13-2/15/2019 Dx: Acute CVA  Telephone call  patient who was advised of reason for call. HIPPA verification received from patient.   Patient voices that she answered yes because cost of nausea patch (scopolamine) was more than sh expected. States accustomed to $3.00 co pay. States Antivert is not helping. States she is in MD office now and will be discussing this with her primary care provider. Patient was advised to ask if lower cost medication is available. Patient voices understanding & states she does plan to ask.   States home health RN has made initial visit. Not sure when next visit. Advised to call to inquire. States has contact info & will call.  No further concerns. EMMI addressed.  Plan: Send to care management assistant to close case.     Sherrin Daisy, RN BSN [place] Management Coordinator [facility] Care Management  [phone]

## 2017-08-04 LAB — BASIC METABOLIC PANEL
BUN / CREAT RATIO: 15 (ref 9–23)
BUN: 25 mg/dL — AB (ref 6–24)
CALCIUM: 8.9 mg/dL (ref 8.7–10.2)
CO2: 20 mmol/L (ref 20–29)
Chloride: 106 mmol/L (ref 96–106)
Creatinine, Ser: 1.72 mg/dL — ABNORMAL HIGH (ref 0.57–1.00)
GFR, EST AFRICAN AMERICAN: 41 mL/min/{1.73_m2} — AB (ref >59)
GFR, EST NON AFRICAN AMERICAN: 36 mL/min/{1.73_m2} — AB (ref >59)
Glucose: 52 mg/dL — ABNORMAL LOW (ref 65–99)
POTASSIUM: 3.7 mmol/L (ref 3.5–5.2)
Sodium: 140 mmol/L (ref 134–144)

## 2017-08-04 LAB — CBC
HEMATOCRIT: 30.5 % — AB (ref 34.0–46.6)
Hemoglobin: 9.9 g/dL — ABNORMAL LOW (ref 11.1–15.9)
MCH: 28.1 pg (ref 26.6–33.0)
MCHC: 32.5 g/dL (ref 31.5–35.7)
MCV: 87 fL (ref 79–97)
Platelets: 272 10*3/uL (ref 150–379)
RBC: 3.52 x10E6/uL — ABNORMAL LOW (ref 3.77–5.28)
RDW: 14.4 % (ref 12.3–15.4)
WBC: 7.1 10*3/uL (ref 3.4–10.8)

## 2017-08-06 ENCOUNTER — Telehealth: Payer: Self-pay

## 2017-08-06 ENCOUNTER — Encounter: Payer: Self-pay | Admitting: Family Medicine

## 2017-08-06 ENCOUNTER — Other Ambulatory Visit: Payer: Self-pay | Admitting: Family Medicine

## 2017-08-06 DIAGNOSIS — D649 Anemia, unspecified: Secondary | ICD-10-CM

## 2017-08-06 MED ORDER — FERROUS SULFATE 325 (65 FE) MG PO TABS: 325.0000 mg | ORAL_TABLET | Freq: Every day | ORAL | 3 refills | Status: DC

## 2017-08-06 NOTE — Telephone Encounter (Signed)
-----   Message from Dorena Dew, [place] sent at 08/06/2017  5:40 AM EST ----- Regarding: lab results Please inform patient that hemoglobin is decreased, which is consistent with anemia. Will add Ferrous sulfate 325 mg daily.  Please call [facility] to add on anemia panel to determine if anemia is d/t iron deficiency.   Also, creatinine level is elevated. Please inquire whether patient established with [facility]. A referral was sent 04/2017.    Thanks

## 2017-08-06 NOTE — Telephone Encounter (Signed)
Spoke with patient, advised that note has not been completed. Will contact patient when complete. Thanks!

## 2017-08-06 NOTE — Telephone Encounter (Signed)
Called and spoke with patient, advised that hemoglobin is decreased and is consistent with anemia. Advised to start Ferrous sulfate 325mg  daily. Also informed that her creatinine level is elevated and she is set to establish with [facility] for March 23th. Thanks!

## 2017-08-08 NOTE — Progress Notes (Signed)
[name], 47 year old female with a history of CVA, uncontrolled diabetes mellitus, diabetic retinopathy, hypertension, and CHF presents for a post hospital follow-up.  Patient was admitted to inpatient services on 07/25/2017.  Patient was in her usual state of health until experiencing a sudden onset of dizziness.  She contacted primary care provider who instructed her to go to the emergency department for further workup and evaluation.  Patient underwent an MRI of the brain which revealed a 6 mm focus of the of acute ischemia in the left paramedian pons.  The MRA did not show large vessel occlusion.  Neurology was consulted who recommended starting Plavix 75 mg daily and aspirin 81 mg.  Patient's 2D echocardiogram showed an ejection fraction of 65-70% with grade 1 diastolic dysfunction.  Home health physical therapy and occupational therapy were ordered prior to discharge.  Patient is ambulating with the assistance of a cane due to ongoing dizziness.  A scopolamine patch was ordered, patient was unable to start patches due to financial constraints.  She has been taking meclizine 25 mg 3 times a day with  unsatisfactory relief.  Patient is scheduled to follow-up in neurology. Ms. Pewitt also has a history of uncontrolled type 2 diabetes mellitus.  Patient has not been checking blood sugars routinely.  She continues to take Levemir 35 units in a.m. and 25 units p.m. patient also uses NovoLog 10 units prior to meals.  She says that she sometimes misses doses of NovoLog. She currently endorses dizziness and headache.  She denies blurred vision chest pains, heart palpitations, shortness of breath, polyuria, polydipsia, or polyphagia. Past Medical History:  Diagnosis Date  . Asthma   . DDD (degenerative disc disease), cervical   . Diabetes mellitus ([place])   . Dislocated shoulder    right  . GERD (gastroesophageal reflux disease)   . Hypertension   . Neuropathy   . Stroke [facility])    Social History    Socioeconomic History  . Marital status: Single    Spouse name: Not on file  . Number of children: 3  . Years of education: 12+  . Highest education level: Not on file  Social Needs  . Financial resource strain: Not on file  . Food insecurity - worry: Not on file  . Food insecurity - inability: Not on file  . Transportation needs - medical: Not on file  . Transportation needs - non-medical: Not on file  Occupational History  . Not on file  Tobacco Use  . Smoking status: Never Smoker  . Smokeless tobacco: Never Used  Substance and Sexual Activity  . Alcohol use: No  . Drug use: No  . Sexual activity: Not on file  Other Topics Concern  . Not on file  Social History Narrative   Lives with son   Caffeine use: daily   Right handed   Immunization History  Administered Date(s) Administered  . Influenza,inj,Quad PF,6+ Mos 05/05/2015, 03/23/2016, 04/23/2017  . Pneumococcal Polysaccharide-23 05/05/2015   Review of Systems  Constitutional: Positive for malaise/fatigue.  HENT: Negative.   Eyes: Negative.   Respiratory: Negative.   Cardiovascular: Negative.   Gastrointestinal: Negative.   Genitourinary: Negative.   Musculoskeletal: Negative.   Skin: Negative.   Neurological: Positive for dizziness and headaches.  Psychiatric/Behavioral: Negative.    BP 134/69 (BP Location: Right Arm, Patient Position: Sitting, Cuff Size: Large)   Pulse 77   Temp 98.4 F (36.9 C) (Oral)   Resp 16   Ht 5' 2.5" (1.588 m)  Wt 198 lb (89.8 kg)   SpO2 100%   BMI 35.64 kg/m   General Appearance:    Alert, cooperative, mild distress, appears stated age  Head:    Normocephalic, without obvious abnormality, atraumatic  Eyes:    PERRL, conjunctiva/corneas clear, EOM's intact, fundi    benign, both eyes  Ears:    Normal TM's and external ear canals, both ears  Nose:   Nares normal, septum midline, mucosa normal, no drainage    or sinus tenderness  Throat:   Lips, mucosa, and tongue normal;  teeth and gums normal  Neck:   Supple, symmetrical, trachea midline, no adenopathy;    thyroid:  no enlargement/tenderness/nodules; no carotid   bruit or JVD  Back:     Symmetric, no curvature, ROM normal, no CVA tenderness  Lungs:     Clear to auscultation bilaterally, respirations unlabored  Chest Wall:    No tenderness or deformity   Heart:    Regular rate and rhythm, S1 and S2 normal, no murmur, rub   or gallop  Abdomen:     Soft, non-tender, bowel sounds active all four quadrants,    no masses, no organomegaly  Extremities:   Extremities normal, atraumatic, no cyanosis or edema  Pulses:   2+ and symmetric all extremities  Skin:   Skin color, texture, turgor normal, no rashes or lesions  Lymph nodes:   Cervical, supraclavicular, and axillary nodes normal  Neurologic:   CNII-XII intact, normal strength, sensation and reflexes    Throughout  Plan   1. Uncontrolled type 2 diabetes mellitus with hypoglycemia without coma ([place]) Diabetes has been uncontrolled for greater than 1 year. She has also had periods of hypoglycemia. Patient warrants referral to endocrinology for further evaluation. Patient has also had microvascular complications related to uncontrolled diabetes mellitus.  - Insulin Detemir (LEVEMIR FLEXTOUCH) 100 UNIT/ML Pen; 40 units in am and 40 units pm  Dispense: 15 mL; Refill: 11 - Glucose, capillary - Glucose, capillary - Ambulatory referral to Endocrinology  2. Anemia, unspecified type - CBC - Basic Metabolic Panel - Anemia panel - Anemia panel - Specimen status report  3. Stage 3 chronic kidney disease ([place]) Ms. Iwanicki is scheduled to follow up with [facility] in March 2019.  - Basic Metabolic Panel  4. Hypoglycemia CBG 59 on arrival. Patient has taken medication and did not have usual breakfast. Glucose tablets administered, increased to 89 prior to discharge.  - Glucose, capillary - Glucose, capillary  5. Vestibular dizziness Will continue meclizine.  Patient is unable to afford scopolomine at this time.  Patient scheduled to follow up in neurology on 09/12/2017 - meclizine (ANTIVERT) 25 MG tablet; Take 1 tablet (25 mg total) by mouth every 8 (eight) hours as needed for dizziness (For dizziness).  Dispense: 90 tablet; Refill: 0  6. History of CVA (cerebrovascular accident) Continue Plavix 75 mg, statin, and ASA therapy as directed.  Follow up in neurology on 09/12/2017   RTC: 1 month for chronic conditions  Greater than 50% of visit spent face to face discussing diet and medication regimen.  The patient was given clear instructions to go to ER or return to medical center if symptoms do not improve, worsen or new problems develop. The patient verbalized understanding. Will notify patient with laboratory results. [name]  MSN, FNP-C [facility] [address] [place], [place] [address] [phone]

## 2017-08-09 ENCOUNTER — Other Ambulatory Visit: Payer: Self-pay | Admitting: Family Medicine

## 2017-08-09 DIAGNOSIS — D638 Anemia in other chronic diseases classified elsewhere: Secondary | ICD-10-CM | POA: Insufficient documentation

## 2017-08-09 LAB — ANEMIA PANEL
Ferritin: 49 ng/mL (ref 15–150)
Hematocrit: 30.5 % — ABNORMAL LOW (ref 34.0–46.6)
IRON: 36 ug/dL (ref 27–159)
Iron Saturation: 15 % (ref 15–55)
TIBC: 236 ug/dL — AB (ref 250–450)
UIBC: 200 ug/dL (ref 131–425)
Vitamin B-12: 607 pg/mL (ref 232–1245)

## 2017-08-09 LAB — SPECIMEN STATUS REPORT

## 2017-08-17 ENCOUNTER — Ambulatory Visit (INDEPENDENT_AMBULATORY_CARE_PROVIDER_SITE_OTHER): Payer: BLUE CROSS/BLUE SHIELD | Admitting: Cardiology

## 2017-08-17 ENCOUNTER — Encounter: Payer: Self-pay | Admitting: Cardiology

## 2017-08-17 DIAGNOSIS — N183 Chronic kidney disease, stage 3 unspecified: Secondary | ICD-10-CM

## 2017-08-17 DIAGNOSIS — Z9641 Presence of insulin pump (external) (internal): Secondary | ICD-10-CM

## 2017-08-17 DIAGNOSIS — I6300 Cerebral infarction due to thrombosis of unspecified precerebral artery: Secondary | ICD-10-CM

## 2017-08-17 DIAGNOSIS — E118 Type 2 diabetes mellitus with unspecified complications: Secondary | ICD-10-CM

## 2017-08-17 DIAGNOSIS — I119 Hypertensive heart disease without heart failure: Secondary | ICD-10-CM | POA: Diagnosis not present

## 2017-08-17 NOTE — H&P (View-Only) (Signed)
08/17/2017 Christina Orozco   09/21/1970  [id number]  Primary Physician Dorena Dew, FNP Primary Cardiologist: Dr Oval Linsey  HPI:  47 y/o female with a history of HTN, HCVD with severe LVH, IDDM, and recurrent CVA. She had seen Dr Oval Linsey 07/19/17 and her B/P had been under pretty good control-120/72. She was then admitted to [facility] with a small pontine CVA. Her main complaint on presentation was dizziness. Her B/P on admission was 148/85. She was placed on full dose ASA and Plavix. Echo was unchanged from previous studies showing her EF to be 65-70% with severe LVH, grade 1 DD, and mild MR. Carotid dopplers showed 1-39% narrowing bilaterally. She is in the office today for follow up. She is doing well overall, no SOB, but she is understandably upset about her recent stroke. She did tell me she has a history intermittent palpitations.   Current Outpatient Medications  Medication Sig Dispense Refill  . albuterol (PROVENTIL HFA;VENTOLIN HFA) 108 (90 Base) MCG/ACT inhaler Inhale 1-2 puffs into the lungs every 6 (six) hours as needed for wheezing or shortness of breath. 1 Inhaler 1  . amLODipine (NORVASC) 5 MG tablet Take 1 tablet (5 mg total) by mouth daily. (Patient taking differently: Take 5 mg by mouth daily. Pt states she takes 10 mg) 90 tablet 3  . aspirin EC 325 MG tablet Take 1 tablet (325 mg total) by mouth daily. 30 tablet 3  . atorvastatin (LIPITOR) 40 MG tablet Take 1 tablet (40 mg total) by mouth daily at 6 PM. 30 tablet 5  . carvedilol (COREG) 25 MG tablet Take 1 tablet (25 mg total) by mouth 2 (two) times daily with a meal. Take 25 mg twice daily 180 tablet 3  . clopidogrel (PLAVIX) 75 MG tablet Take 1 tablet (75 mg total) by mouth daily. 30 tablet 4  . doxazosin (CARDURA) 4 MG tablet Take 1 tablet (4 mg total) by mouth daily. 90 tablet 1  . ferrous sulfate 325 (65 FE) MG tablet Take 1 tablet (325 mg total) by mouth daily. 30 tablet 3  . hydrochlorothiazide (HYDRODIURIL) 12.5 MG  tablet Take 12.5 mg by mouth daily.    . insulin aspart (NOVOLOG) 100 UNIT/ML FlexPen Inject 10 Units into the skin 3 (three) times daily with meals. 15 mL 11  . Insulin Detemir (LEVEMIR FLEXTOUCH) 100 UNIT/ML Pen 40 units in am and 40 units pm 15 mL 11  . Insulin Pen Needle 32G X 5 MM MISC For Insulin Flex Pens 100 each 3  . meclizine (ANTIVERT) 25 MG tablet Take 1 tablet (25 mg total) by mouth every 8 (eight) hours as needed for dizziness (For dizziness). 90 tablet 0  . scopolamine (TRANSDERM-SCOP) 1 MG/3DAYS Place 1 patch (1.5 mg total) onto the skin every 3 (three) days. (Patient not taking: Reported on 08/03/2017) 5 patch 0   No current facility-administered medications for this visit.     Allergies  Allergen Reactions  . Lisinopril Swelling and Other (See Comments)    Facial swelling   . Penicillins Hives    Has patient had a PCN reaction causing immediate rash, facial/tongue/throat swelling, SOB or lightheadedness with hypotension: yes Has patient had a PCN reaction causing severe rash involving mucus membranes or skin necrosis: No Has patient had a PCN reaction that required hospitalization No Has patient had a PCN reaction occurring within the last 10 years: No If all of the above answers are "NO", then may proceed wit  . Gabapentin Swelling and Other (  See Comments)    "I thought I was having another stroke."     Past Medical History:  Diagnosis Date  . Asthma   . DDD (degenerative disc disease), cervical   . Diabetes mellitus ([place])   . Dislocated shoulder    right  . GERD (gastroesophageal reflux disease)   . Hypertension   . Neuropathy   . Stroke [facility])     Social History   Socioeconomic History  . Marital status: Single    Spouse name: Not on file  . Number of children: 3  . Years of education: 12+  . Highest education level: Not on file  Social Needs  . Financial resource strain: Not on file  . Food insecurity - worry: Not on file  . Food insecurity -  inability: Not on file  . Transportation needs - medical: Not on file  . Transportation needs - non-medical: Not on file  Occupational History  . Not on file  Tobacco Use  . Smoking status: Never Smoker  . Smokeless tobacco: Never Used  Substance and Sexual Activity  . Alcohol use: No  . Drug use: No  . Sexual activity: Not on file  Other Topics Concern  . Not on file  Social History Narrative   Lives with son   Caffeine use: daily   Right handed     Family History  Problem Relation Age of Onset  . Vascular Disease Mother   . CAD Mother   . Heart failure Mother   . Heart disease Unknown   . Cancer Unknown        colon, stomach, lung  . Diabetes Unknown   . Seizures Unknown   . Breast cancer Sister 30     Review of Systems: General: negative for chills, fever, night sweats or weight changes.  Cardiovascular: negative for chest pain, dyspnea on exertion, edema, orthopnea, palpitations, paroxysmal nocturnal dyspnea or shortness of breath Dermatological: negative for rash Respiratory: negative for cough or wheezing Urologic: negative for hematuria Abdominal: negative for nausea, vomiting, diarrhea, bright red blood per rectum, melena, or hematemesis Neurologic: negative for visual changes, syncope, or dizziness All other systems reviewed and are otherwise negative except as noted above.    Blood pressure 140/74, pulse 79, height 5' 2.5" (1.588 m), weight 199 lb (90.3 kg), SpO2 99 %.  General appearance: alert, cooperative, no distress and mildly obese Neck: no JVD Lungs: clear to auscultation bilaterally Heart: regular rate and rhythm Extremities: trace edema Skin: Skin color, texture, turgor normal. 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    Transesophageal echocardiogram was discussed with [facility] for stroke.  After careful review of history and examination, the risks and benefits of transesophageal echocardiogram have been explained including risks of esophageal damage, perforation (1:10,000 risk), bleeding, pharyngeal hematoma as well as other potential complications associated with conscious sedation including aspiration, arrhythmia, respiratory failure and death. Alternatives to treatment were discussed, questions were answered. Patient is willing to proceed.   Kerin Ransom, [place]  08/17/2017 8:47  AM    

## 2017-08-17 NOTE — Patient Instructions (Addendum)
Medication Instructions:  Your physician recommends that you continue on your current medications as directed. Please refer to the Current Medication list given to you today.  Labwork: WILL COMPLETE LABS IN ON Monday FOR TEE  Testing/Procedures: Your physician has requested that you have a TEE. During a TEE, sound waves are used to create images of your heart. It provides your doctor with information about the size and shape of your heart and how well your heart's chambers and valves are working. In this test, a transducer is attached to the end of a flexible tube that's guided down your throat and into your esophagus (the tube leading from you mouth to your stomach) to get a more detailed image of your heart. You are not awake for the procedure. Please see the instruction sheet given to you today. For further information please visit [email or web address].  Loop recorder  Follow-Up: None as of right now  Any Other Special Instructions Will Be Listed Below (If Applicable). If you need a refill on your cardiac medications before your next appointment, please call your pharmacy.    [facility] [address] [place] [address] Dept: [phone] Loc: [place]  08/17/2017  You are scheduled for a TEE on Monday, March 18 with Dr. Dani Gobble Croitoru.  1. Please arrive at the [facility] (Main Entrance A) at [facility]: [place], [place] [address] at 10:30 AM (90 minutes before your procedure to ensure your preparation). Free valet parking service is available.   Special note: Every effort is made to have your procedure done on time. Please understand that emergencies sometimes delay scheduled procedures.  2. Diet: Do not eat or drink anything after midnight prior to your procedure except sips of water to take medications.  3. Labs: [facility] 08/20/2017 HERE AT OUR  OFFICE   4. Medication instructions in preparation for your procedure:   Current Outpatient Medications (Endocrine & Metabolic):  .  insulin aspart (NOVOLOG) 100 UNIT/ML FlexPen, Inject 10 Units into the skin 3 (three) times daily with meals. .  Insulin Detemir (LEVEMIR FLEXTOUCH) 100 UNIT/ML Pen, 40 units in am and 40 units pm  Current Outpatient Medications (Cardiovascular):  .  amLODipine (NORVASC) 5 MG tablet, Take 1 tablet (5 mg total) by mouth daily. (Patient taking differently: Take 5 mg by mouth daily. Pt states she takes 10 mg) .  atorvastatin (LIPITOR) 40 MG tablet, Take 1 tablet (40 mg total) by mouth daily at 6 PM. .  carvedilol (COREG) 25 MG tablet, Take 1 tablet (25 mg total) by mouth 2 (two) times daily with a meal. Take 25 mg twice daily .  doxazosin (CARDURA) 4 MG tablet, Take 1 tablet (4 mg total) by mouth daily. .  hydrochlorothiazide (HYDRODIURIL) 12.5 MG tablet, Take 12.5 mg by mouth daily.  Current Outpatient Medications (Respiratory):  .  albuterol (PROVENTIL HFA;VENTOLIN HFA) 108 (90 Base) MCG/ACT inhaler, Inhale 1-2 puffs into the lungs every 6 (six) hours as needed for wheezing or shortness of breath.  Current Outpatient Medications (Analgesics):  .  aspirin EC 325 MG tablet, Take 1 tablet (325 mg total) by mouth daily.  Current Outpatient Medications (Hematological):  .  clopidogrel (PLAVIX) 75 MG tablet, Take 1 tablet (75 mg total) by mouth daily. .  ferrous sulfate 325 (65 FE) MG tablet, Take 1 tablet (325 mg total) by mouth daily.  Current Outpatient Medications (Other):  Marland Kitchen  Insulin Pen  Needle 32G X 5 MM MISC, For Insulin Flex Pens .  meclizine (ANTIVERT) 25 MG tablet, Take 1 tablet (25 mg total) by mouth every 8 (eight) hours as needed for dizziness (For dizziness). Marland Kitchen  scopolamine (TRANSDERM-SCOP) 1 MG/3DAYS, Place 1 patch (1.5 mg total) onto the skin every 3 (three) days. (Patient not taking: Reported on 08/03/2017) *For reference purposes while preparing  patient instructions.   Delete this med list prior to printing instructions for patient.*  Take only 20 units of insulin the night before your procedure. Do not take any insulin on the day of the procedure.  You can take all your meds the morning of the procedure with a small sip of water, EXCEPT for the insulin.   5. Plan for one night stay--bring personal belongings. 6. Bring a current list of your medications and current insurance cards. 7. You MUST have a responsible person to drive you home. 8. Someone MUST be with you the first 24 hours after you arrive home or your discharge will be delayed. 9. Please wear clothes that are easy to get on and off and wear slip-on shoes.  Thank you for allowing [name] to care for you!   -- [facility]

## 2017-08-17 NOTE — Progress Notes (Signed)
08/17/2017 Christina Orozco   [birth year]  [id number]  Primary Physician Dorena Dew, FNP Primary Cardiologist: Dr Oval Linsey  HPI:  47 y/o female with a history of HTN, HCVD with severe LVH, IDDM, and recurrent CVA. She had seen Dr Oval Linsey 07/19/17 and her B/P had been under pretty good control-120/72. She was then admitted to [facility] with a small pontine CVA. Her main complaint on presentation was dizziness. Her B/P on admission was 148/85. She was placed on full dose ASA and Plavix. Echo was unchanged from previous studies showing her EF to be 65-70% with severe LVH, grade 1 DD, and mild MR. Carotid dopplers showed 1-39% narrowing bilaterally. She is in the office today for follow up. She is doing well overall, no SOB, but she is understandably upset about her recent stroke. She did tell me she has a history intermittent palpitations.   Current Outpatient Medications  Medication Sig Dispense Refill  . albuterol (PROVENTIL HFA;VENTOLIN HFA) 108 (90 Base) MCG/ACT inhaler Inhale 1-2 puffs into the lungs every 6 (six) hours as needed for wheezing or shortness of breath. 1 Inhaler 1  . amLODipine (NORVASC) 5 MG tablet Take 1 tablet (5 mg total) by mouth daily. (Patient taking differently: Take 5 mg by mouth daily. Pt states she takes 10 mg) 90 tablet 3  . aspirin EC 325 MG tablet Take 1 tablet (325 mg total) by mouth daily. 30 tablet 3  . atorvastatin (LIPITOR) 40 MG tablet Take 1 tablet (40 mg total) by mouth daily at 6 PM. 30 tablet 5  . carvedilol (COREG) 25 MG tablet Take 1 tablet (25 mg total) by mouth 2 (two) times daily with a meal. Take 25 mg twice daily 180 tablet 3  . clopidogrel (PLAVIX) 75 MG tablet Take 1 tablet (75 mg total) by mouth daily. 30 tablet 4  . doxazosin (CARDURA) 4 MG tablet Take 1 tablet (4 mg total) by mouth daily. 90 tablet 1  . ferrous sulfate 325 (65 FE) MG tablet Take 1 tablet (325 mg total) by mouth daily. 30 tablet 3  . hydrochlorothiazide (HYDRODIURIL) 12.5 MG  tablet Take 12.5 mg by mouth daily.    . insulin aspart (NOVOLOG) 100 UNIT/ML FlexPen Inject 10 Units into the skin 3 (three) times daily with meals. 15 mL 11  . Insulin Detemir (LEVEMIR FLEXTOUCH) 100 UNIT/ML Pen 40 units in am and 40 units pm 15 mL 11  . Insulin Pen Needle 32G X 5 MM MISC For Insulin Flex Pens 100 each 3  . meclizine (ANTIVERT) 25 MG tablet Take 1 tablet (25 mg total) by mouth every 8 (eight) hours as needed for dizziness (For dizziness). 90 tablet 0  . scopolamine (TRANSDERM-SCOP) 1 MG/3DAYS Place 1 patch (1.5 mg total) onto the skin every 3 (three) days. (Patient not taking: Reported on 08/03/2017) 5 patch 0   No current facility-administered medications for this visit.     Allergies  Allergen Reactions  . Lisinopril Swelling and Other (See Comments)    Facial swelling   . Penicillins Hives    Has patient had a PCN reaction causing immediate rash, facial/tongue/throat swelling, SOB or lightheadedness with hypotension: yes Has patient had a PCN reaction causing severe rash involving mucus membranes or skin necrosis: No Has patient had a PCN reaction that required hospitalization No Has patient had a PCN reaction occurring within the last 10 years: No If all of the above answers are "NO", then may proceed wit  . Gabapentin Swelling and Other (  See Comments)    "I thought I was having another stroke."     Past Medical History:  Diagnosis Date  . Asthma   . DDD (degenerative disc disease), cervical   . Diabetes mellitus ([place])   . Dislocated shoulder    right  . GERD (gastroesophageal reflux disease)   . Hypertension   . Neuropathy   . Stroke [facility])     Social History   Socioeconomic History  . Marital status: Single    Spouse name: Not on file  . Number of children: 3  . Years of education: 12+  . Highest education level: Not on file  Social Needs  . Financial resource strain: Not on file  . Food insecurity - worry: Not on file  . Food insecurity -  inability: Not on file  . Transportation needs - medical: Not on file  . Transportation needs - non-medical: Not on file  Occupational History  . Not on file  Tobacco Use  . Smoking status: Never Smoker  . Smokeless tobacco: Never Used  Substance and Sexual Activity  . Alcohol use: No  . Drug use: No  . Sexual activity: Not on file  Other Topics Concern  . Not on file  Social History Narrative   Lives with son   Caffeine use: daily   Right handed     Family History  Problem Relation Age of Onset  . Vascular Disease Mother   . CAD Mother   . Heart failure Mother   . Heart disease Unknown   . Cancer Unknown        colon, stomach, lung  . Diabetes Unknown   . Seizures Unknown   . Breast cancer Sister 30     Review of Systems: General: negative for chills, fever, night sweats or weight changes.  Cardiovascular: negative for chest pain, dyspnea on exertion, edema, orthopnea, palpitations, paroxysmal nocturnal dyspnea or shortness of breath Dermatological: negative for rash Respiratory: negative for cough or wheezing Urologic: negative for hematuria Abdominal: negative for nausea, vomiting, diarrhea, bright red blood per rectum, melena, or hematemesis Neurologic: negative for visual changes, syncope, or dizziness All other systems reviewed and are otherwise negative except as noted above.    Blood pressure 140/74, pulse 79, height 5' 2.5" (1.588 m), weight 199 lb (90.3 kg), SpO2 99 %.  General appearance: alert, cooperative, no distress and mildly obese Neck: no JVD Lungs: clear to auscultation bilaterally Heart: regular rate and rhythm Extremities: trace edema Skin: Skin color, texture, turgor normal. 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    Transesophageal echocardiogram was discussed with [facility] for stroke.  After careful review of history and examination, the risks and benefits of transesophageal echocardiogram have been explained including risks of esophageal damage, perforation (1:10,000 risk), bleeding, pharyngeal hematoma as well as other potential complications associated with conscious sedation including aspiration, arrhythmia, respiratory failure and death. Alternatives to treatment were discussed, questions were answered. Patient is willing to proceed.   Kerin Ransom, [place]  08/17/2017 8:47  AM    

## 2017-08-17 NOTE — Assessment & Plan Note (Signed)
Normal LVF with severe LVH and grade 1 DD on echo Feb 2019

## 2017-08-17 NOTE — Assessment & Plan Note (Signed)
H/O CVA 2017 with recurrent pontine CVA Feb 2019

## 2017-08-17 NOTE — Assessment & Plan Note (Signed)
Type 2 IDDM with CRI-3

## 2017-08-17 NOTE — Assessment & Plan Note (Signed)
Last SCr 1.7

## 2017-08-20 ENCOUNTER — Telehealth: Payer: Self-pay | Admitting: Cardiovascular Disease

## 2017-08-20 NOTE — Telephone Encounter (Signed)
Patient calling, states that she cancelled the procedure scheduled for tomorrow 08-21-17 but she received call from hospital to confirm it. Patient was told that our office would have to cancel appointment with hosptial staff. Patient would like to cancel procedure for tomorrow 08-21-17.

## 2017-08-20 NOTE — Telephone Encounter (Signed)
Returned the call to the patient. She stated that she was told at her office visit that she would be having her TEE on the same day as her Loop Recorder placed. Her loop recorder is scheduled for 08/27/17. She has requested that her TEE be canceled for tomorrow. Endo has been called and it has been cancelled. She will need to be scheduled for a TEE. Message routed to the [place] covering the provider that day.

## 2017-08-21 ENCOUNTER — Ambulatory Visit (HOSPITAL_COMMUNITY)
Admission: RE | Admit: 2017-08-21 | Payer: BLUE CROSS/BLUE SHIELD | Source: Ambulatory Visit | Admitting: Cardiovascular Disease

## 2017-08-21 ENCOUNTER — Encounter (HOSPITAL_COMMUNITY): Admission: RE | Payer: Self-pay | Source: Ambulatory Visit

## 2017-08-21 LAB — BASIC METABOLIC PANEL
BUN/Creatinine Ratio: 18 (ref 9–23)
BUN: 35 mg/dL — ABNORMAL HIGH (ref 6–24)
CO2: 20 mmol/L (ref 20–29)
Calcium: 9.2 mg/dL (ref 8.7–10.2)
Chloride: 104 mmol/L (ref 96–106)
Creatinine, Ser: 1.92 mg/dL — ABNORMAL HIGH (ref 0.57–1.00)
GFR calc Af Amer: 36 mL/min/{1.73_m2} — ABNORMAL LOW (ref >59)
GFR calc non Af Amer: 31 mL/min/{1.73_m2} — ABNORMAL LOW (ref >59)
Glucose: 138 mg/dL — ABNORMAL HIGH (ref 65–99)
Potassium: 4.1 mmol/L (ref 3.5–5.2)
Sodium: 141 mmol/L (ref 134–144)

## 2017-08-21 LAB — CBC WITH DIFFERENTIAL/PLATELET
Basophils Absolute: 0 10*3/uL (ref 0.0–0.2)
Basos: 0 %
EOS (ABSOLUTE): 0.2 10*3/uL (ref 0.0–0.4)
Eos: 3 %
Hematocrit: 29.2 % — ABNORMAL LOW (ref 34.0–46.6)
Hemoglobin: 9.8 g/dL — ABNORMAL LOW (ref 11.1–15.9)
Immature Grans (Abs): 0 10*3/uL (ref 0.0–0.1)
Immature Granulocytes: 0 %
Lymphocytes Absolute: 2.7 10*3/uL (ref 0.7–3.1)
Lymphs: 38 %
MCH: 28.3 pg (ref 26.6–33.0)
MCHC: 33.6 g/dL (ref 31.5–35.7)
MCV: 84 fL (ref 79–97)
Monocytes Absolute: 0.3 10*3/uL (ref 0.1–0.9)
Monocytes: 4 %
Neutrophils Absolute: 3.8 10*3/uL (ref 1.4–7.0)
Neutrophils: 55 %
Platelets: 308 10*3/uL (ref 150–379)
RBC: 3.46 x10E6/uL — ABNORMAL LOW (ref 3.77–5.28)
RDW: 14.6 % (ref 12.3–15.4)
WBC: 7 10*3/uL (ref 3.4–10.8)

## 2017-08-21 LAB — PT AND PTT
INR: 1 (ref 0.8–1.2)
Prothrombin Time: 10.5 s (ref 9.1–12.0)
aPTT: 26 s (ref 24–33)

## 2017-08-21 SURGERY — ECHOCARDIOGRAM, TRANSESOPHAGEAL
Anesthesia: Moderate Sedation

## 2017-08-22 ENCOUNTER — Other Ambulatory Visit: Payer: Self-pay | Admitting: Cardiology

## 2017-08-22 ENCOUNTER — Other Ambulatory Visit: Payer: Self-pay

## 2017-08-22 NOTE — Telephone Encounter (Signed)
Spoke with patient and apologized for the inconvenience. Gave patient updated information about TEE-arrival time 10:30 and reviewed TEE procedures with patient as if she was in office; she voiced understanding.

## 2017-08-22 NOTE — Telephone Encounter (Signed)
Called and rescheduled TEE for Monday with Dr Sallyanne Kuster on the same day as loop recorder. Called patient and left message for her to return my call.

## 2017-08-23 ENCOUNTER — Telehealth: Payer: Self-pay

## 2017-08-23 NOTE — Telephone Encounter (Signed)
Annabell Sabal, Nurse from [facility] care called to inform us that they have only been able to do a start of care visit with patient because they are waiting on insurance approval. They just wanted to make [name] aware and as soon as they get authorization they will pick up care. Thanks!

## 2017-08-27 ENCOUNTER — Ambulatory Visit (HOSPITAL_BASED_OUTPATIENT_CLINIC_OR_DEPARTMENT_OTHER)
Admission: RE | Admit: 2017-08-27 | Discharge: 2017-08-27 | Disposition: A | Discharge status: Home / Self Care | Payer: BLUE CROSS/BLUE SHIELD | Source: Ambulatory Visit | Attending: Cardiology | Admitting: Cardiology

## 2017-08-27 ENCOUNTER — Encounter (HOSPITAL_COMMUNITY)
Admission: RE | Disposition: A | Discharge status: Home / Self Care | Payer: Self-pay | Source: Ambulatory Visit | Attending: Cardiovascular Disease

## 2017-08-27 ENCOUNTER — Encounter (HOSPITAL_COMMUNITY): Payer: Self-pay | Admitting: *Deleted

## 2017-08-27 ENCOUNTER — Ambulatory Visit (HOSPITAL_COMMUNITY)
Admission: RE | Admit: 2017-08-27 | Discharge: 2017-08-27 | Disposition: A | Discharge status: Home / Self Care | Payer: BLUE CROSS/BLUE SHIELD | Source: Ambulatory Visit | Attending: Cardiovascular Disease | Admitting: Cardiovascular Disease

## 2017-08-27 ENCOUNTER — Other Ambulatory Visit: Payer: Self-pay

## 2017-08-27 DIAGNOSIS — I639 Cerebral infarction, unspecified: Secondary | ICD-10-CM | POA: Diagnosis present

## 2017-08-27 DIAGNOSIS — Z880 Allergy status to penicillin: Secondary | ICD-10-CM | POA: Diagnosis not present

## 2017-08-27 DIAGNOSIS — Z79899 Other long term (current) drug therapy: Secondary | ICD-10-CM | POA: Diagnosis not present

## 2017-08-27 DIAGNOSIS — Z9641 Presence of insulin pump (external) (internal): Secondary | ICD-10-CM | POA: Diagnosis not present

## 2017-08-27 DIAGNOSIS — Z888 Allergy status to other drugs, medicaments and biological substances status: Secondary | ICD-10-CM | POA: Diagnosis not present

## 2017-08-27 DIAGNOSIS — Z8673 Personal history of transient ischemic attack (TIA), and cerebral infarction without residual deficits: Secondary | ICD-10-CM | POA: Insufficient documentation

## 2017-08-27 DIAGNOSIS — J45909 Unspecified asthma, uncomplicated: Secondary | ICD-10-CM | POA: Insufficient documentation

## 2017-08-27 DIAGNOSIS — I6389 Other cerebral infarction: Secondary | ICD-10-CM

## 2017-08-27 DIAGNOSIS — Z7982 Long term (current) use of aspirin: Secondary | ICD-10-CM | POA: Insufficient documentation

## 2017-08-27 DIAGNOSIS — Z5189 Encounter for other specified aftercare: Secondary | ICD-10-CM | POA: Insufficient documentation

## 2017-08-27 DIAGNOSIS — I1310 Hypertensive heart and chronic kidney disease without heart failure, with stage 1 through stage 4 chronic kidney disease, or unspecified chronic kidney disease: Secondary | ICD-10-CM | POA: Insufficient documentation

## 2017-08-27 DIAGNOSIS — N183 Chronic kidney disease, stage 3 (moderate): Secondary | ICD-10-CM | POA: Insufficient documentation

## 2017-08-27 DIAGNOSIS — I6381 Other cerebral infarction due to occlusion or stenosis of small artery: Secondary | ICD-10-CM

## 2017-08-27 DIAGNOSIS — E1122 Type 2 diabetes mellitus with diabetic chronic kidney disease: Secondary | ICD-10-CM | POA: Insufficient documentation

## 2017-08-27 DIAGNOSIS — E1140 Type 2 diabetes mellitus with diabetic neuropathy, unspecified: Secondary | ICD-10-CM | POA: Diagnosis not present

## 2017-08-27 DIAGNOSIS — Z794 Long term (current) use of insulin: Secondary | ICD-10-CM | POA: Insufficient documentation

## 2017-08-27 DIAGNOSIS — Z7902 Long term (current) use of antithrombotics/antiplatelets: Secondary | ICD-10-CM | POA: Diagnosis not present

## 2017-08-27 HISTORY — PX: LOOP RECORDER INSERTION: EP1214

## 2017-08-27 HISTORY — PX: TEE WITHOUT CARDIOVERSION: SHX5443

## 2017-08-27 LAB — GLUCOSE, CAPILLARY: Glucose-Capillary: 188 mg/dL — ABNORMAL HIGH (ref 65–99)

## 2017-08-27 SURGERY — LOOP RECORDER INSERTION

## 2017-08-27 SURGERY — ECHOCARDIOGRAM, TRANSESOPHAGEAL
Anesthesia: Moderate Sedation

## 2017-08-27 MED ORDER — SODIUM CHLORIDE 0.9 % IV SOLN
INTRAVENOUS | Status: DC
  Administered 2017-08-27: 11:00:00 via INTRAVENOUS

## 2017-08-27 MED ORDER — LIDOCAINE-EPINEPHRINE 1 %-1:100000 IJ SOLN
INTRAMUSCULAR | Status: AC
  Filled 2017-08-27: qty 1

## 2017-08-27 MED ORDER — BUTAMBEN-TETRACAINE-BENZOCAINE 2-2-14 % EX AERO
INHALATION_SPRAY | CUTANEOUS | Status: DC | PRN
  Administered 2017-08-27: 2 via TOPICAL

## 2017-08-27 MED ORDER — FENTANYL CITRATE (PF) 100 MCG/2ML IJ SOLN
INTRAMUSCULAR | Status: DC | PRN
  Administered 2017-08-27: 25 ug via INTRAVENOUS

## 2017-08-27 MED ORDER — MIDAZOLAM HCL 5 MG/ML IJ SOLN
INTRAMUSCULAR | Status: AC
  Filled 2017-08-27: qty 2

## 2017-08-27 MED ORDER — FENTANYL CITRATE (PF) 100 MCG/2ML IJ SOLN
INTRAMUSCULAR | Status: AC
Start: 2017-08-27 — End: ?
  Filled 2017-08-27: qty 2

## 2017-08-27 MED ORDER — MIDAZOLAM HCL 10 MG/2ML IJ SOLN
INTRAMUSCULAR | Status: DC | PRN
  Administered 2017-08-27 (×2): 2 mg via INTRAVENOUS

## 2017-08-27 MED ORDER — LIDOCAINE-EPINEPHRINE 1 %-1:100000 IJ SOLN
INTRAMUSCULAR | Status: DC | PRN
  Administered 2017-08-27: 20 mL

## 2017-08-27 SURGICAL SUPPLY — 2 items
LOOP REVEAL LINQSYS (Prosthesis & Implant Heart) ×2 IMPLANT
PACK LOOP INSERTION (CUSTOM PROCEDURE TRAY) ×2 IMPLANT

## 2017-08-27 NOTE — Discharge Instructions (Signed)
TEE  YOU HAD AN CARDIAC PROCEDURE TODAY: Refer to the procedure report and other information in the discharge instructions given to you for any specific questions about what was found during the examination. If this information does not answer your questions, please call [facility] office at [phone] to clarify.   DIET: Your first meal following the procedure should be a light meal and then it is ok to progress to your normal diet. A half-sandwich or bowl of soup is an example of a good first meal. Heavy or fried foods are harder to digest and may make you feel nauseous or bloated. Drink plenty of fluids but you should avoid alcoholic beverages for 24 hours. If you had a esophageal dilation, please see attached instructions for diet.   ACTIVITY: Your care partner should take you home directly after the procedure. You should plan to take it easy, moving slowly for the rest of the day. You can resume normal activity the day after the procedure however YOU SHOULD NOT DRIVE, use power tools, machinery or perform tasks that involve climbing or major physical exertion for 24 hours (because of the sedation medicines used during the test).   SYMPTOMS TO REPORT IMMEDIATELY: A cardiologist can be reached at any hour. Please call [phone] for any of the following symptoms:  Vomiting of blood or coffee ground material  New, significant abdominal pain  New, significant chest pain or pain under the shoulder blades  Painful or persistently difficult swallowing  New shortness of breath  Black, tarry-looking or red, bloody stools  FOLLOW UP:  Please also call with any specific questions about appointments or follow up tests.  Supplemental Discharge Instructions for  Pacemaker/Defibrillator Patients  Activity DO wear your seatbelt, even if it crosses over the pacemaker site.  WOUND CARE - Keep the wound area clean and dry.  Remove the dressing the day after you return home (usually 48 hours  after the procedure). - DO NOT SUBMERGE UNDER WATER UNTIL FULLY HEALED (no tub baths, hot tubs, swimming pools, etc.).  - You  may shower or take a sponge bath after the dressing is removed. DO NOT SOAK the area and do not allow the shower to directly spray on the site. - If you have staples, these will be removed in the office in 7-14 days. - If you have tape/steri-strips on your wound, these will fall off; do not pull them off prematurely.   - No bandage is needed on the site.  DO  NOT apply any creams, oils, or ointments to the wound area. - If you notice any drainage or discharge from the wound, any swelling, excessive redness or bruising at the site, or if you develop a fever > 101? F after you are discharged home, call the office at once.  Special Instructions - You are still able to use cellular telephones.  Avoid carrying your cellular phone near your device. - When traveling through airports, show security personnel your identification card to avoid being screened in the metal detectors.  - Avoid arc welding equipment, MRI testing (magnetic resonance imaging), TENS units (transcutaneous nerve stimulators).  Call the office for questions about other devices. - Avoid electrical appliances that are in poor condition or are not properly grounded. - Microwave ovens are safe to be near or to operate.

## 2017-08-27 NOTE — Op Note (Signed)
INDICATIONS: cryptogenic stroke  PROCEDURE:   Informed consent was obtained prior to the procedure. The risks, benefits and alternatives for the procedure were discussed and the patient comprehended these risks.  Risks include, but are not limited to, cough, sore throat, vomiting, nausea, somnolence, esophageal and stomach trauma or perforation, bleeding, low blood pressure, aspiration, pneumonia, infection, trauma to the teeth and death.    After a procedural time-out, the oropharynx was anesthetized with 20% benzocaine spray.   During this procedure the patient was administered a total of Versed 4 mg and Fentanyl 25 mcg to achieve and maintain moderate conscious sedation.  The patient's heart rate, blood pressure, and oxygen saturationweare monitored continuously during the procedure. The period of conscious sedation was 10 minutes, of which I was present face-to-face 100% of this time.  The transesophageal probe was inserted in the esophagus and stomach without difficulty and multiple views were obtained.  The patient was kept under observation until the patient left the procedure room.  The patient left the procedure room in stable condition.   Agitated microbubble saline contrast was administered.  COMPLICATIONS:    There were no immediate complications.  FINDINGS:  No cardiac or aortic source of embolism.  RECOMMENDATIONS:     Proceed with implantable loop recorder.  Time Spent Directly with the Patient:  30 minutes   Tytan Sandate 08/27/2017, 12:10 PM

## 2017-08-27 NOTE — Interval H&P Note (Signed)
History and Physical Interval Note:  08/27/2017 11:21 AM  Christina Orozco  has presented today for surgery, with the diagnosis of STROKE  The various methods of treatment have been discussed with the patient and family. After consideration of risks, benefits and other options for treatment, the patient has consented to  Procedure(s): TRANSESOPHAGEAL ECHOCARDIOGRAM (TEE) (N/A) as a surgical intervention .  The patient's history has been reviewed, patient examined, no change in status, stable for surgery.  I have reviewed the patient's chart and labs.  Questions were answered to the patient's satisfaction.     [name]

## 2017-08-27 NOTE — Op Note (Signed)
LOOP RECORDER IMPLANT   Procedure report  Procedure performed:  Loop recorder implantation   Reason for procedure:  1. Cryptogenic stroke Procedure performed by:  Sanda Klein, MD  Complications:  None  Estimated blood loss:  <5 mL  Medications administered during procedure:  Lidocaine 1% with 1/10,000 epinephrine 10 mL locally Device details:  Medtronic Reveal Linq model number [id number], serial number [id number] Procedure details:  After the risks and benefits of the procedure were discussed the patient provided informed consent. The patient was prepped and draped in usual sterile fashion. Local anesthesia was administered to an area 2 cm to the left of the sternum in the 4th intercostal space. A cutaneous incision was made using the incision tool. The introducer was then used to create a subcutaneous tunnel and carefully deploy the device. Local pressure was held to ensure hemostasis.  The incision was closed with SteriStrips and a sterile dressing was applied.  R waves 0.25 mV.  Sanda Klein, MD, [place] [phone] office [phone] pager 08/27/2017 1:12 PM

## 2017-08-27 NOTE — Progress Notes (Signed)
  Echocardiogram Echocardiogram Transesophageal has been performed.  Christina Orozco 08/27/2017, 12:34 PM

## 2017-08-28 ENCOUNTER — Ambulatory Visit: Payer: BLUE CROSS/BLUE SHIELD | Admitting: Endocrinology

## 2017-08-28 ENCOUNTER — Telehealth: Payer: Self-pay | Admitting: Cardiovascular Disease

## 2017-08-28 NOTE — Telephone Encounter (Signed)
New Message   Patient is calling in reference to a TEE that she had done yesterday. She states that she is bleeding. Please call to discuss.

## 2017-08-28 NOTE — Telephone Encounter (Signed)
Returned call to patient.She stated she had a loop recorder implanted yesterday.Stated bandage is saturated in bright red blood.Spoke to Dr.Croitoru he advised he would see patient today at 1:20 pm.

## 2017-08-29 ENCOUNTER — Telehealth: Payer: Self-pay | Admitting: Cardiovascular Disease

## 2017-08-29 MED ORDER — HYDRALAZINE HCL 50 MG PO TABS: 50.0000 mg | ORAL_TABLET | Freq: Two times a day (BID) | ORAL | 5 refills | Status: DC

## 2017-08-29 MED ORDER — AMLODIPINE BESYLATE 5 MG PO TABS: 7.5000 mg | ORAL_TABLET | Freq: Every day | ORAL | 5 refills | Status: DC

## 2017-08-29 NOTE — Telephone Encounter (Signed)
Noted BP down to 174/91 after few minutes.  Please have patient take an additional dose of amlodipine today, then change dose to 7.5mg  daily (start 08/30/17).    Follow up in 3 weeks with Dr Oval Linsey as previously scheduled.   Call back if BP up in 190s again. Visit nearest ER if high blood pressure associated with headaches, blurry vision or dizziness.

## 2017-08-29 NOTE — Telephone Encounter (Signed)
Patient called with [facility] recommendations of taking amlodipine 10mg  (total) today and then increasing to 7.5mg  QD thereafter AND adding hydralazine 50mg  BID. Advised when to seek ED eval and when to call office. She will keep MD follow up as scheduled. Rx(s) sent to pharmacy electronically.

## 2017-08-29 NOTE — Telephone Encounter (Signed)
Add hydralazine 50mg  bid.

## 2017-08-29 NOTE — Telephone Encounter (Signed)
New Message:   Jacqlyn Larsen from [facility] is at home with pt. BP reading 192/100 about 10 minutes ago and it was in her left arm. Pt states she feels fine.

## 2017-08-29 NOTE — Telephone Encounter (Signed)
Spoke with Jacqlyn Larsen, nurse with [facility]. She was evaluating patient and noted her BP to be 192/100. Patient was asymptomatic. She has no other BP readings to provide. Informed her I will reach out to patient for more info, for additional BP readings if available.   Called patient concerning her BP readings. She has a BP cuff but does not know how to get to the history of her BP cuff. She reports her BP has been elevated since she went to hospital for procedure, was told this was nervousness. Verified with patient that she is compliant with her current medications.   Will route to MD/CVRR for recommendations on BP  Hospital VS: Vital Signs   Row Name  08/27/17 1218  08/27/17 1212  08/27/17 1210  08/27/17 1205  08/27/17 1200  Vital Signs  Temp  97.6 F (36.4 C)  -  -  -  -  Temp src  Oral  -  -  -  -  Pulse  77  76  77  77  77  Pulse Rate Source  Monitor  Monitor  Monitor  Monitor  Monitor  ECG Heart Rate  77  76  77  81  76  Resp  13  11  10  17  14   BP  174/91 Abnormal   183/92 Abnormal   191/100 Abnormal   210/104 Abnormal   196/102 Abnormal

## 2017-08-31 ENCOUNTER — Ambulatory Visit (INDEPENDENT_AMBULATORY_CARE_PROVIDER_SITE_OTHER): Payer: BLUE CROSS/BLUE SHIELD | Admitting: Family Medicine

## 2017-08-31 ENCOUNTER — Encounter: Payer: Self-pay | Admitting: Family Medicine

## 2017-08-31 VITALS — BP 146/70 | HR 81 | Temp 98.0°F | Resp 14 | Ht 62.5 in | Wt 202.0 lb

## 2017-08-31 DIAGNOSIS — E118 Type 2 diabetes mellitus with unspecified complications: Secondary | ICD-10-CM

## 2017-08-31 DIAGNOSIS — Z9641 Presence of insulin pump (external) (internal): Secondary | ICD-10-CM

## 2017-09-02 ENCOUNTER — Other Ambulatory Visit: Payer: Self-pay | Admitting: Family Medicine

## 2017-09-02 DIAGNOSIS — J4520 Mild intermittent asthma, uncomplicated: Secondary | ICD-10-CM

## 2017-09-03 NOTE — Progress Notes (Signed)
Chart opened in error.   Christina Pounds  MSN, FNP-C [facility] [address] [place], [place] [address] [phone]

## 2017-09-10 ENCOUNTER — Ambulatory Visit (INDEPENDENT_AMBULATORY_CARE_PROVIDER_SITE_OTHER): Payer: BLUE CROSS/BLUE SHIELD | Admitting: Endocrinology

## 2017-09-10 ENCOUNTER — Ambulatory Visit: Payer: BLUE CROSS/BLUE SHIELD

## 2017-09-10 ENCOUNTER — Encounter: Payer: Self-pay | Admitting: Endocrinology

## 2017-09-10 DIAGNOSIS — E11649 Type 2 diabetes mellitus with hypoglycemia without coma: Secondary | ICD-10-CM

## 2017-09-10 MED ORDER — GLUCOSE BLOOD VI STRP: 1.0000 | ORAL_STRIP | Freq: Two times a day (BID) | 12 refills | Status: DC

## 2017-09-10 MED ORDER — INSULIN GLARGINE 100 UNIT/ML SOLOSTAR PEN: 70.0000 [IU] | PEN_INJECTOR | SUBCUTANEOUS | 99 refills | Status: DC

## 2017-09-10 NOTE — Progress Notes (Signed)
Subjective:    Patient ID: Christina Orozco, female    DOB: 1970/12/31, 47 y.o.   MRN: [id number]  HPI pt is referred by Cammie Sickle, NP, for diabetes.  Pt states DM was dx'ed in 1998; she has mild neuropathy of the lower extremities; she has associated PDR, renal failure, and CVA; he has been on insulin since soon after dx; pt says her diet is good, but exercise is limited by health problems; she has never had GDM, pancreatitis, pancreatic surgery, severe hypoglycemia or DKA.  She takes novolog 10 units 3 times a day (just before each meal), and levemir 40 units BID.  She says cbg's vary from 45-300.  There is no trend throughout the day. Pt says she sometimes misses the insulin.   Past Medical History:  Diagnosis Date  . Asthma   . DDD (degenerative disc disease), cervical   . Diabetes mellitus ([place])   . Dislocated shoulder    right  . GERD (gastroesophageal reflux disease)   . Hypertension   . Neuropathy   . Stroke [facility])     Past Surgical History:  Procedure Laterality Date  . ADENOIDECTOMY    . APPENDECTOMY    . CERVICAL SPINE SURGERY  06/2012   C5-C6  . EYE SURGERY     left eye surgery   . LOOP RECORDER INSERTION N/A 08/27/2017   Procedure: LOOP RECORDER INSERTION;  Surgeon: Sanda Klein, MD;  Location: [facility] CV LAB;  Service: Cardiovascular;  Laterality: N/A;  . SHOULDER SURGERY    . TEE WITHOUT CARDIOVERSION N/A 08/27/2017   Procedure: TRANSESOPHAGEAL ECHOCARDIOGRAM (TEE);  Surgeon: Sanda Klein, MD;  Location: [facility] ENDOSCOPY;  Service: Cardiovascular;  Laterality: N/A;  . TONSILLECTOMY      Social History   Socioeconomic History  . Marital status: Single    Spouse name: Not on file  . Number of children: 3  . Years of education: 12+  . Highest education level: Not on file  Occupational History  . Not on file  Social Needs  . Financial resource strain: Not on file  . Food insecurity:    Worry: Not on file    Inability: Not on file  .  Transportation needs:    Medical: Not on file    Non-medical: Not on file  Tobacco Use  . Smoking status: Never Smoker  . Smokeless tobacco: Never Used  Substance and Sexual Activity  . Alcohol use: No  . Drug use: No  . Sexual activity: Not on file  Lifestyle  . Physical activity:    Days per week: Not on file    Minutes per session: Not on file  . Stress: Not on file  Relationships  . Social connections:    Talks on phone: Not on file    Gets together: Not on file    Attends religious service: Not on file    Active member of club or organization: Not on file    Attends meetings of clubs or organizations: Not on file    Relationship status: Not on file  . Intimate partner violence:    Fear of current or ex partner: Not on file    Emotionally abused: Not on file    Physically abused: Not on file    Forced sexual activity: Not on file  Other Topics Concern  . Not on file  Social History Narrative   Lives with son   Caffeine use: daily   Right handed    Current Outpatient Medications  on File Prior to Visit  Medication Sig Dispense Refill  . albuterol (PROVENTIL HFA;VENTOLIN HFA) 108 (90 Base) MCG/ACT inhaler INHALE 1 OR 2 PUFFS INTO THE LUNGS EVERY 6 HOURS AS NEEDED FOR WHEEZING OR SHORTNESS OF BREATH 8.5 g 0  . amLODipine (NORVASC) 5 MG tablet Take 1.5 tablets (7.5 mg total) by mouth daily. 45 tablet 5  . aspirin EC 325 MG tablet Take 1 tablet (325 mg total) by mouth daily. 30 tablet 3  . atorvastatin (LIPITOR) 40 MG tablet Take 1 tablet (40 mg total) by mouth daily at 6 PM. 30 tablet 5  . carvedilol (COREG) 25 MG tablet Take 1 tablet (25 mg total) by mouth 2 (two) times daily with a meal. Take 25 mg twice daily (Patient taking differently: Take 25 mg by mouth 2 (two) times daily with a meal. ) 180 tablet 3  . clopidogrel (PLAVIX) 75 MG tablet Take 1 tablet (75 mg total) by mouth daily. 30 tablet 4  . doxazosin (CARDURA) 4 MG tablet Take 1 tablet (4 mg total) by mouth  daily. 90 tablet 1  . ferrous sulfate 325 (65 FE) MG tablet Take 1 tablet (325 mg total) by mouth daily. 30 tablet 3  . hydrALAZINE (APRESOLINE) 50 MG tablet Take 1 tablet (50 mg total) by mouth 2 (two) times daily. 60 tablet 5  . hydrochlorothiazide (HYDRODIURIL) 25 MG tablet Take 25 mg by mouth daily.     . meclizine (ANTIVERT) 25 MG tablet Take 1 tablet (25 mg total) by mouth every 8 (eight) hours as needed for dizziness (For dizziness). (Patient taking differently: Take 25 mg by mouth every 8 (eight) hours. ) 90 tablet 0   No current facility-administered medications on file prior to visit.     Allergies  Allergen Reactions  . Lisinopril Swelling and Other (See Comments)    Facial swelling   . Penicillins Hives    Has patient had a PCN reaction causing immediate rash, facial/tongue/throat swelling, SOB or lightheadedness with hypotension: yes Has patient had a PCN reaction causing severe rash involving mucus membranes or skin necrosis: No Has patient had a PCN reaction that required hospitalization No Has patient had a PCN reaction occurring within the last 10 years: No If all of the above answers are "NO", then may proceed wit  . Gabapentin Swelling and Other (See Comments)    "I thought I was having another stroke."     Family History  Problem Relation Age of Onset  . Vascular Disease Mother   . CAD Mother   . Heart failure Mother   . Heart disease Unknown   . Cancer Unknown        colon, stomach, lung  . Diabetes Unknown   . Seizures Unknown   . Breast cancer Sister 30    BP (!) 170/72 (BP Location: Left Arm, Patient Position: Sitting, Cuff Size: Normal)   Pulse 82   Wt 205 lb 12.8 oz (93.4 kg)   SpO2 98%   BMI 37.04 kg/m   Review of Systems denies blurry vision, headache, chest pain, n/v, muscle cramps, memory loss, depression, rhinorrhea, and easy bruising.  She has polyuria, doe, cold intolerance, dry skin, and weight gain     Objective:   Physical Exam VS:  see vs page GEN: no distress HEAD: head: no deformity eyes: no periorbital swelling, no proptosis external nose and ears are normal mouth: no lesion seen NECK: supple, thyroid is not enlarged CHEST WALL: no deformity LUNGS: clear to  auscultation CV: reg rate and rhythm, no murmur ABD: abdomen is soft, nontender.  no hepatosplenomegaly.  not distended.  no hernia MUSCULOSKELETAL: muscle bulk and strength are grossly normal.  no obvious joint swelling.  gait is slow but steady EXTEMITIES: no deformity.  no ulcer on the feet.  feet are of normal color and temp.  no edema PULSES: dorsalis pedis intact bilat.  no carotid bruit NEURO:  cn 2-12 grossly intact.   readily moves all 4's.  sensation is intact to touch on the feet SKIN:  Normal texture and temperature.  No rash or suspicious lesion is visible.   NODES:  None palpable at the neck PSYCH: alert, well-oriented.  Does not appear anxious nor depressed.  Lab Results  Component Value Date   HGBA1C 12.5 (H) 07/26/2017   Lab Results  Component Value Date   CREATININE 1.92 (H) 08/20/2017   BUN 35 (H) 08/20/2017   NA 141 08/20/2017   K 4.1 08/20/2017   CL 104 08/20/2017   CO2 20 08/20/2017   I personally reviewed electrocardiogram tracing (07/25/17): Indication: CVA Impression: NSR with occ PVC.  Ant QS complexes.  No hypertrophy. Compared to 06/29/17: PVC's are new  I have reviewed outside records, and summarized: Pt was noted to have severely elevated a1c, and referred here.  She was recently admitted with acute CVA.  Glycemic control was achieved with insulin      Assessment & Plan:  HTN: is noted today Insulin-requiring type 2 DM, with renal failure: severe exacerbation.  Goal is improvement a more reasonable a1c, so we'll change to qd insulin.    Patient Instructions  Your blood pressure is high today.  Please see your primary care provider soon, to have it rechecked good diet and exercise significantly improve the  control of your diabetes.  please let me know if you wish to be referred to a dietician.  high blood sugar is very risky to your health.  you should see an eye doctor and dentist every year.  It is very important to get all recommended vaccinations.  Controlling your blood pressure and cholesterol drastically reduces the damage diabetes does to your body.  Those who smoke should quit.  Please discuss these with your doctor.  check your blood sugar twice a day.  vary the time of day when you check, between before the 3 meals, and at bedtime.  also check if you have symptoms of your blood sugar being too high or too low.  please keep a record of the readings and bring it to your next appointment here (or you can bring the meter itself).  You can write it on any piece of paper.  please call us sooner if your blood sugar goes below 70, or if you have a lot of readings over 200. I have sent a prescription to your pharmacy, to change both insulins to lantus, 70 units each morning.  Please come back for a follow-up appointment in 2 weeks

## 2017-09-10 NOTE — Patient Instructions (Addendum)
Your blood pressure is high today.  Please see your primary care provider soon, to have it rechecked good diet and exercise significantly improve the control of your diabetes.  please let me know if you wish to be referred to a dietician.  high blood sugar is very risky to your health.  you should see an eye doctor and dentist every year.  It is very important to get all recommended vaccinations.  Controlling your blood pressure and cholesterol drastically reduces the damage diabetes does to your body.  Those who smoke should quit.  Please discuss these with your doctor.  check your blood sugar twice a day.  vary the time of day when you check, between before the 3 meals, and at bedtime.  also check if you have symptoms of your blood sugar being too high or too low.  please keep a record of the readings and bring it to your next appointment here (or you can bring the meter itself).  You can write it on any piece of paper.  please call us sooner if your blood sugar goes below 70, or if you have a lot of readings over 200. I have sent a prescription to your pharmacy, to change both insulins to lantus, 70 units each morning.  Please come back for a follow-up appointment in 2 weeks

## 2017-09-12 ENCOUNTER — Encounter: Payer: Self-pay | Admitting: Neurology

## 2017-09-12 ENCOUNTER — Ambulatory Visit (INDEPENDENT_AMBULATORY_CARE_PROVIDER_SITE_OTHER): Payer: Self-pay | Admitting: *Deleted

## 2017-09-12 ENCOUNTER — Ambulatory Visit (INDEPENDENT_AMBULATORY_CARE_PROVIDER_SITE_OTHER): Payer: BLUE CROSS/BLUE SHIELD | Admitting: Neurology

## 2017-09-12 VITALS — BP 143/82 | HR 77 | Ht 62.5 in | Wt 205.0 lb

## 2017-09-12 DIAGNOSIS — I6381 Other cerebral infarction due to occlusion or stenosis of small artery: Secondary | ICD-10-CM

## 2017-09-12 DIAGNOSIS — I639 Cerebral infarction, unspecified: Secondary | ICD-10-CM

## 2017-09-12 DIAGNOSIS — G5603 Carpal tunnel syndrome, bilateral upper limbs: Secondary | ICD-10-CM | POA: Diagnosis not present

## 2017-09-12 LAB — CUP PACEART INCLINIC DEVICE CHECK
Implantable Pulse Generator Implant Date: 20190318
MDC IDC SESS DTM: 20190403162808

## 2017-09-12 NOTE — Progress Notes (Signed)
Wound Loop check in clinic. Steri-Strips remoevd prior to OV. Incision edges approximated, wound well healed no redness or edema noted. Battery status: Good R-waves 0.20 mV. 0 symptom episodes, 0 tachy episodes, 0 pause episodes, 0 brady episodes. 0 AF episodes. Monthly summary reports and ROV with [facility] PRN

## 2017-09-12 NOTE — Progress Notes (Signed)
[facility]    Provider:  Dr Christina Orozco Referring Provider: Dorena Dew, FNP Primary Care Physician:  Christina Dew, FNP  CC:  Stroke  Interval history: She woke up dizzy, acutely in the morning, thought it was her blood pressure.  No other symptoms.  Nothing made it better or made it worse.  Her head felt funny.  It was continuous.  No weakness or other associated symptoms.  No inciting events that she knows of.  Called her heart doctor and primary doctor, told her to go to the ED where a stroke was diagnosed. 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 Stop ASA and continue Plavix. She continues to have weakness in the left hand. She has tingling in the left digits 1-3. She is compliant with medications currently per patient but has not been in the past. Repeat emg/ncs as she had carpal tunnel likely ongoing contributing to left hand systems. 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  She would like to go back to work.  Personally reviewed imaging:   1. Small, 6 mm focus of acute ischemia within the left paramedian pons. 2. 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  ldl 43 hgba1c 12.5   Interval history July 03, 2017: Noncompliant, she has not been taking her medications, she ran out and stopped taking insulin and blood pressure medicine. Patient is here with focal neurologic symptoms.  Repeat MRI of the brain did not show anything acute.  Patient blood pressure has been upwards of 921 systolic and she continues to have uncontrolled blood pressure and uncontrolled diabetes discussed with patient this is the likely cause of her symptoms and patient has to manage these multiple medical issues or she will likely have a stroke or other seuelae.  Reviewed compliance, discussed pathophysiology of strokes and the role of her uncontrolled vascular risk factors,  she already has extensive white matter disease in the brain.    When she woke up Friday morning she didn;t feel well, the right side of her face felt numb, in the ED she was noted to be "eating a box of chicken, drinking soda in NAD". That day her vision was impaired. She went to the ED and her BP was 194/174 systolic and she left the ED. MRI did not show anything acute. She went to the eye doctor. Eyes jumping and twitching. Last HgbA1c 10.8.   On testing of the eyes patient had torsional nystagmus, none today in the office, corrected visual acuity was right 20/25 and left 20/400(was 20/150).  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  Interval History 10/24/2016: Walking is better, sensory symptoms are better. Trying to control her DM and blood pressure. Has a lot of stress in her life. She is having eye complications due to her diabetes and HTN.   HPI:  Christina Orozco is a 47 y.o. female here as a referral from Dr. Smith Orozco for gait abnormality. PMHx uncontrolled HTN, uncontrolled diabetes. 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  MRA was performed which showed no evidence for high grade stenosis being involved in her condition. She ws drinking a lot of sodas, she was extremely thirsty, the next day she went to church and she was slurring and couldn't walk. She  is feeling better. She is having problems with walking and with vision. Mostly up close vision. Right arm pain resolved with "shot in the arm" in the shoulder, she went to orthopaedics and was treated for right shoulder. No dizziness. Some confusion but overall doing really well. Still some imbalance.   MRI brain 04/30/2016:  Interval history 01/26/2016: Patient here for follow up. She had right arm pain with 2 negative EMG/NCS no etiology found  except bilateral CTS. MRI of the brain showed abnormal white matter disease likely due to her uncontrolled vascular risk factors but was periventricular and concerning for demyelination, LP for other causes such as MS negative but need repeat MRI to follow especially given continued symptoms. She has uncontrolled HTN today in the office 366 systolic, uncontrolled Christina Orozco 12-14 and eating [employer] in the clinic exam room. She has pain in the elbow, pain in the right shoulder, she still has weakness sin the right arm. She has headaches and vision changes. Vision worsening in the right eye. She got new glasses and she still can't see. She has double vision and blurry vision.   Christina Orozco Christina Orozco a 47 y.o.femalehere as a referral from Dr. [place] Orozco right hand numbness and paresthesias. PMHx HTN, DM, asthma, neuropathy, cervical degenerative disease s/p acdf.No falls.Numbnees and tingling in the right arm started at 6am yesterday morning and it hasn't stopped. She has numbness in the entire right arm all the way up toe shoulder. She woke up with the pain, the whole arm. At first she thought it was coming from the shoulder, now not sure. She does have shoulder problems, has been hurting for years and she has had surgery on it. She is pulling and yanking on it at work, heat on the shoulder didn't help. She is having weakness in the hand. She is not using the hand to drive. She has significant pain 10/10. Numb and tinlging like when her feet fall asleep. She gets sharp radiating pain in the wrist and shoot upwards. Weakness in the grip. 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 Never had any numbness or tingling in the hand in the past. It is the whole hand even the palm No numbness or weakness of legs.No headache, slurred speech, She has no history of TIA or stroke. No head injuries or falls. No anti-coagulant use.  Reviewed  notes, labs and imaging from outside physicians, which showed:  Personally reviewed imaging and agree with the following:  MRI HEAD IMPRESSION:  1. No acute intracranial process. 2. Patchy T2/FLAIR hyperintense foci within the periventricular and deep white matter both cerebral 6 hemispheres, mildly advanced for patient age. These foci are somewhat nonspecific, with differential considerations including accelerated/hereditary small vessel ischemia (favored), sequela of prior trauma, hypercoagulable state, vasculitis, migraines, prior infection, or possibly underlying demyelinating disease.  MRI CERVICAL IMPRESSION:  1. No acute abnormality within the cervical spine. 2. Status post ACDF at C1-6 without complication. 3. Minimal multilevel degenerative disc disease as above. There is resultant mild bilateral foraminal narrowing at C3-4. No other significant stenosis within the cervical spine. 4. 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  She presented to the ED on 07/03/2014 and woke up with the symptoms. The paresthesias extended into the forearm.Denied weakness. No falls or inciting factors. She was discharged after negative MRI of the brain and cervical cord. Referred to neurology OP. Glucose was in the 400s. She denies falling asleep in a position that would  hurt the arm or in a way that would compress the axilla or any part of the arm. Startes she did not sleep on it at all.   Review of Systems: Patient complains of symptoms per HPI as well as the following symptoms: memory loss, confusion, numbness, headache, slurred speech, dizziness. Pertinent negatives per HPI. All others negative.   Social History   Socioeconomic History  . Marital status: Single    Spouse name: Not on file  . Number of children: 3  . Years of education: 12+  . Highest education level: Not on file  Occupational History  . Not on file  Social Needs  . Financial resource strain: Not on file  . Food insecurity:      Worry: Not on file    Inability: Not on file  . Transportation needs:    Medical: Not on file    Non-medical: Not on file  Tobacco Use  . Smoking status: Never Smoker  . Smokeless tobacco: Never Used  Substance and Sexual Activity  . Alcohol use: No  . Drug use: No  . Sexual activity: Not on file  Lifestyle  . Physical activity:    Days per week: Not on file    Minutes per session: Not on file  . Stress: Not on file  Relationships  . Social connections:    Talks on phone: Not on file    Gets together: Not on file    Attends religious service: Not on file    Active member of club or organization: Not on file    Attends meetings of clubs or organizations: Not on file    Relationship status: Not on file  . Intimate partner violence:    Fear of current or ex partner: Not on file    Emotionally abused: Not on file    Physically abused: Not on file    Forced sexual activity: Not on file  Other Topics Concern  . Not on file  Social History Narrative   Lives with daughter   Caffeine use: 3 cups daily   Right handed    Family History  Problem Relation Age of Onset  . Vascular Disease Mother   . CAD Mother   . Heart failure Mother   . Heart disease Unknown   . Cancer Unknown        colon, stomach, lung  . Diabetes Unknown   . Seizures Unknown   . Breast cancer Sister 77    Past Medical History:  Diagnosis Date  . Anemia    severe  . Asthma   . CKD (chronic kidney disease)    stage IV  . DDD (degenerative disc disease), cervical   . Diabetes mellitus ([place])   . Dislocated shoulder    right  . GERD (gastroesophageal reflux disease)   . Hypertension   . Neuropathy   . Stroke [facility])     Past Surgical History:  Procedure Laterality Date  . ADENOIDECTOMY    . APPENDECTOMY    . CERVICAL SPINE SURGERY  06/2012   C5-C6  . EYE SURGERY     left eye surgery   . FOOT SURGERY    . LOOP RECORDER INSERTION N/A 08/27/2017   Procedure: LOOP RECORDER INSERTION;   Surgeon: Sanda Klein, MD;  Location: [place] [facility];  Service: Cardiovascular;  Laterality: N/A;  . SHOULDER SURGERY    . TEE WITHOUT CARDIOVERSION N/A 08/27/2017   Procedure: TRANSESOPHAGEAL ECHOCARDIOGRAM (TEE);  Surgeon: Sanda Klein, MD;  Location: [facility];  Service: Cardiovascular;  Laterality: N/A;  . TONSILLECTOMY      Current Outpatient Medications  Medication Sig Dispense Refill  . albuterol (PROVENTIL HFA;VENTOLIN HFA) 108 (90 Base) MCG/ACT inhaler INHALE 1 OR 2 PUFFS INTO THE LUNGS EVERY 6 HOURS AS NEEDED FOR WHEEZING OR SHORTNESS OF BREATH 8.5 g 0  . amLODipine (NORVASC) 5 MG tablet Take 1.5 tablets (7.5 mg total) by mouth daily. 45 tablet 5  . aspirin EC 325 MG tablet Take 1 tablet (325 mg total) by mouth daily. 30 tablet 3  . atorvastatin (LIPITOR) 40 MG tablet Take 1 tablet (40 mg total) by mouth daily at 6 PM. 30 tablet 5  . carvedilol (COREG) 25 MG tablet Take 1 tablet (25 mg total) by mouth 2 (two) times daily with a meal. Take 25 mg twice daily (Patient taking differently: Take 25 mg by mouth 2 (two) times daily with a meal. ) 180 tablet 3  . clopidogrel (PLAVIX) 75 MG tablet Take 1 tablet (75 mg total) by mouth daily. 30 tablet 4  . doxazosin (CARDURA) 4 MG tablet Take 1 tablet (4 mg total) by mouth daily. 90 tablet 1  . ferrous sulfate 325 (65 FE) MG tablet Take 1 tablet (325 mg total) by mouth daily. 30 tablet 3  . glucose blood (CONTOUR NEXT TEST) test strip 1 each by Other route 2 (two) times daily. And lancets 2/day 100 each 12  . hydrALAZINE (APRESOLINE) 50 MG tablet Take 1 tablet (50 mg total) by mouth 2 (two) times daily. 60 tablet 5  . hydrochlorothiazide (HYDRODIURIL) 25 MG tablet Take 25 mg by mouth 3 (three) times a week. Monday, Wednesday, Friday    . Insulin Glargine (LANTUS SOLOSTAR) 100 UNIT/ML Solostar Pen Inject 70 Units into the skin every morning. And pen needles 1/day 10 pen PRN  . meclizine (ANTIVERT) 25 MG tablet Take 1 tablet (25 mg  total) by mouth every 8 (eight) hours as needed for dizziness (For dizziness). (Patient taking differently: Take 25 mg by mouth every 8 (eight) hours. ) 90 tablet 0   No current facility-administered medications for this visit.     Allergies as of 09/12/2017 - Review Complete 09/12/2017  Allergen Reaction Noted  . Lisinopril Swelling and Other (See Comments) 03/23/2016  . Penicillins Hives 03/07/2012  . Gabapentin Swelling and Other (See Comments) 07/11/2016    Vitals: BP (!) 143/82 (BP Location: Left Arm, Patient Position: Sitting)   Pulse 77   Ht 5' 2.5" (1.588 m)   Wt 205 lb (93 kg)   BMI 36.90 kg/m  Last Weight:  Wt Readings from Last 1 Encounters:  09/12/17 205 lb (93 kg)   Last Height:   Ht Readings from Last 1 Encounters:  09/12/17 5' 2.5" (1.588 m)    Speech: Speech is normal; fluent and spontaneous with normal comprehension.  Cognition: The patient is oriented to person, place, and time;  Cranial Nerves:  Extraocular movements are intact. Decreased right face to LT. ,The muscles of mastication are normal. The face is symmetric. The palate elevates in the midline. Hearing intact. Voice is normal. Shoulder shrug is normal. The tongue has normal motion without fasciculations. Could not visualize the fundi.   Coordination: Normal finger to nose and heel to shin.  Gait: Good stride, can heel-toe and tandem without imbalance.  Motor Observation: No asymmetry, no atrophy, and no involuntary movements noted. Tone: Normal muscle tone.   Posture: Posture is normal. normal erect  Strength: decreased grip  left hand  Sensation: intact pin prick, temperature and vibration distally in the feet. Decreased right arm and face, intact legs. Negative Romberg.   Reflex Exam:  DTR's: Deep tendon reflexes in the upper and lower extremities are normal bilaterally, reflexes in the AJs 1+.  Toes: Right down, left equiv Clonus: Clonus is  absent.   Assessment/Plan:46 y.o. female here for follow up. 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 On ophthalmology exam showed  patient had torsional nystagmus (none today in the office) corrected visual acuity was right 20/25 and left 20/400(last 20/150).    -Patient recently admitted at [facility] for stroke, small vessel, likely due to chronic microvascular changes secondary to her uncontrolled diabetes, uncontrolled hypertension and medical noncompliance.  Discussed this again today.  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  Today she claims she is restarted her medications and is compliant now.  Discussed again how important compliance is and that medical problems are direct result of her uncontrolled vascular risk factors.   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 Needs close management of HTN and diabetes and will follow her clinically   Discussed again today in detail: Lacunar infarcts due to uncontrolled vascular risk factors: I had a long d/w patient about her stroke, risk for recurrent stroke/TIAs, personally independently reviewed imaging studies and stroke evaluation results and answered questions. Plavix for stroke prevention, for secondary stroke prevention and maintain strict control of hypertension with blood pressure goal below 130/90,  diabetes with hemoglobin A1c goal below 6.5% and lipids with LDL cholesterol goal below 70 mg/dL.I also advised the patient to eat a healthy diet with plenty of whole grains, cereals, fruits and vegetables, exercise regularly and maintain ideal body weight .  Hand pain: Bilateral EMG nerve conduction study.  She was diagnosed with carpal tunnel syndrome in 2017, likely worsening and may need surgical intervention.   Sarina Ill, MD  [facility] [address] [place], [place] [address]  Phone [phone] Fax [phone]  A total of 25 minutes was spent face-to-face with this patient. Over half this time was spent on counseling patient on the lacunar infarct due to small vessel disease diagnosis, and carpal tunnel syndrome and different diagnostic and therapeutic options available.

## 2017-09-12 NOTE — Patient Instructions (Signed)
Carpal Tunnel Syndrome Carpal tunnel syndrome is a condition that causes pain in your hand and arm. The carpal tunnel is a narrow area that is on the palm side of your wrist. Repeated wrist motion or certain diseases may cause swelling in the tunnel. This swelling can pinch the main nerve in the wrist (median nerve). Follow these instructions at home: If you have a splint:  Wear it as told by your doctor. Remove it only as told by your doctor.  Loosen the splint if your fingers: ? Become numb and tingle. ? Turn blue and cold.  Keep the splint clean and dry. General instructions  Take over-the-counter and prescription medicines only as told by your doctor.  Rest your wrist from any activity that may be causing your pain. If needed, talk to your employer about changes that can be made in your work, such as getting a wrist pad to use while typing.  If directed, apply ice to the painful area: ? Put ice in a plastic bag. ? Place a towel between your skin and the bag. ? Leave the ice on for 20 minutes, 2-3 times per day.  Keep all follow-up visits as told by your doctor. This is important.  Do any exercises as told by your doctor, physical therapist, or occupational therapist. Contact a doctor if:  You have new symptoms.  Medicine does not help your pain.  Your symptoms get worse. This information is not intended to replace advice given to you by your health care provider. Make sure you discuss any questions you have with your health care provider. Document Released: 05/18/2011 Document Revised: 11/04/2015 Document Reviewed: 10/14/2014 Elsevier Interactive Patient Education  2018 [employer].   Stroke Prevention Some medical conditions and behaviors are associated with a higher chance of having a stroke. You can help prevent a stroke by making nutrition, lifestyle, and other changes, including managing any medical conditions you may have. What nutrition changes can be made?  Eat  healthy foods. You can do this by: ? Choosing foods high in fiber, such as fresh fruits and vegetables and whole grains. ? Eating at least 5 or more servings of fruits and vegetables a day. Try to fill half of your plate at each meal with fruits and vegetables. ? Choosing lean protein foods, such as lean cuts of meat, poultry without skin, fish, tofu, beans, and nuts. ? Eating low-fat dairy products. ? Avoiding foods that are high in salt (sodium). This can help lower blood pressure. ? Avoiding foods that have saturated fat, trans fat, and cholesterol. This can help prevent high cholesterol. ? Avoiding processed and premade foods.  Follow your health care provider's specific guidelines for losing weight, controlling high blood pressure (hypertension), lowering high cholesterol, and managing diabetes. These may include: ? Reducing your daily calorie intake. ? Limiting your daily sodium intake to 1,500 milligrams (mg). ? Using only healthy fats for cooking, such as olive oil, canola oil, or sunflower oil. ? Counting your daily carbohydrate intake. What lifestyle changes can be made?  Maintain a healthy weight. Talk to your health care provider about your ideal weight.  Get at least 30 minutes of moderate physical activity at least 5 days a week. Moderate activity includes brisk walking, biking, and swimming.  Do not use any products that contain nicotine or tobacco, such as cigarettes and e-cigarettes. If you need help quitting, ask your health care provider. It may also be helpful to avoid exposure to secondhand smoke.  Limit alcohol  intake to no more than 1 drink a day for nonpregnant women and 2 drinks a day for men. One drink equals 12 oz of beer, 5 oz of wine, or 1 oz of hard liquor.  Stop any illegal drug use.  Avoid taking birth control pills. Talk to your health care provider about the risks of taking birth control pills if: ? You are over 48 years old. ? You smoke. ? You get  migraines. ? You have ever had a blood clot. What other changes can be made?  Manage your cholesterol levels. ? Eating a healthy diet is important for preventing high cholesterol. If cholesterol cannot be managed through diet alone, you may also need to take medicines. ? Take any prescribed medicines to control your cholesterol as told by your health care provider.  Manage your diabetes. ? Eating a healthy diet and exercising regularly are important parts of managing your blood sugar. If your blood sugar cannot be managed through diet and exercise, you may need to take medicines. ? Take any prescribed medicines to control your diabetes as told by your health care provider.  Control your hypertension. ? To reduce your risk of stroke, try to keep your blood pressure below 130/80. ? Eating a healthy diet and exercising regularly are an important part of controlling your blood pressure. If your blood pressure cannot be managed through diet and exercise, you may need to take medicines. ? Take any prescribed medicines to control hypertension as told by your health care provider. ? Ask your health care provider if you should monitor your blood pressure at home. ? Have your blood pressure checked every year, even if your blood pressure is normal. Blood pressure increases with age and some medical conditions.  Get evaluated for sleep disorders (sleep apnea). Talk to your health care provider about getting a sleep evaluation if you snore a lot or have excessive sleepiness.  Take over-the-counter and prescription medicines only as told by your health care provider. Aspirin or blood thinners (antiplatelets or anticoagulants) may be recommended to reduce your risk of forming blood clots that can lead to stroke.  Make sure that any other medical conditions you have, such as atrial fibrillation or atherosclerosis, are managed. What are the warning signs of a stroke? The warning signs of a stroke can be  easily remembered as BEFAST.  B is for balance. Signs include: ? Dizziness. ? Loss of balance or coordination. ? Sudden trouble walking.  E is for eyes. Signs include: ? A sudden change in vision. ? Trouble seeing.  F is for face. Signs include: ? Sudden weakness or numbness of the face. ? The face or eyelid drooping to one side.  A is for arms. Signs include: ? Sudden weakness or numbness of the arm, usually on one side of the body.  S is for speech. Signs include: ? Trouble speaking (aphasia). ? Trouble understanding.  T is for time. ? These symptoms may represent a serious problem that is an emergency. Do not wait to see if the symptoms will go away. Get medical help right away. Call your local emergency services (911 in the [place] Do not drive yourself to the hospital.  Other signs of stroke may include: ? A sudden, severe headache with no known cause. ? Nausea or vomiting. ? Seizure.  Where to find more information: For more information, visit:  American Stroke Association: www.strokeassociation.org  National Stroke Association: www.stroke.org  Summary  You can prevent a stroke by eating healthy,  exercising, not smoking, limiting alcohol intake, and managing any medical conditions you may have.  Do not use any products that contain nicotine or tobacco, such as cigarettes and e-cigarettes. If you need help quitting, ask your health care provider. It may also be helpful to avoid exposure to secondhand smoke.  Remember BEFAST for warning signs of stroke. Get help right away if you or a loved one has any of these signs. This information is not intended to replace advice given to you by your health care provider. Make sure you discuss any questions you have with your health care provider. Document Released: 07/06/2004 Document Revised: 07/04/2016 Document Reviewed: 07/04/2016 Elsevier Interactive Patient Education  [employer].

## 2017-09-14 ENCOUNTER — Other Ambulatory Visit: Payer: Self-pay | Admitting: Nephrology

## 2017-09-14 DIAGNOSIS — N183 Chronic kidney disease, stage 3 unspecified: Secondary | ICD-10-CM

## 2017-09-14 DIAGNOSIS — I129 Hypertensive chronic kidney disease with stage 1 through stage 4 chronic kidney disease, or unspecified chronic kidney disease: Secondary | ICD-10-CM

## 2017-09-18 ENCOUNTER — Ambulatory Visit (INDEPENDENT_AMBULATORY_CARE_PROVIDER_SITE_OTHER): Payer: BLUE CROSS/BLUE SHIELD | Admitting: Cardiovascular Disease

## 2017-09-18 ENCOUNTER — Encounter: Payer: Self-pay | Admitting: Cardiovascular Disease

## 2017-09-18 VITALS — BP 138/72 | HR 78 | Ht 62.5 in | Wt 208.2 lb

## 2017-09-18 DIAGNOSIS — Z79899 Other long term (current) drug therapy: Secondary | ICD-10-CM

## 2017-09-18 DIAGNOSIS — I639 Cerebral infarction, unspecified: Secondary | ICD-10-CM | POA: Diagnosis not present

## 2017-09-18 DIAGNOSIS — N183 Chronic kidney disease, stage 3 unspecified: Secondary | ICD-10-CM

## 2017-09-18 DIAGNOSIS — E7800 Pure hypercholesterolemia, unspecified: Secondary | ICD-10-CM

## 2017-09-18 DIAGNOSIS — R0602 Shortness of breath: Secondary | ICD-10-CM | POA: Diagnosis not present

## 2017-09-18 DIAGNOSIS — I10 Essential (primary) hypertension: Secondary | ICD-10-CM

## 2017-09-18 MED ORDER — HYDRALAZINE HCL 50 MG PO TABS: 50.0000 mg | ORAL_TABLET | Freq: Three times a day (TID) | ORAL | 5 refills | Status: DC

## 2017-09-18 NOTE — Progress Notes (Signed)
Cardiology Office Note   Date:  09/18/2017   ID:  Christina Orozco, DOB 1971-06-04, MRN [id number]  PCP:  Dorena Dew, FNP  Cardiologist:   Skeet Latch, MD   Chief Complaint  Patient presents with  . Follow-up    pt states some SOB      History of Present Illness: Christina Orozco is a 47 y.o. female with hypertension, severe LVH, diabetes, recurrent stroke, and asthma who presents for follow-up. She was initially seen 12/2016 for an evaluation of hypertension and LVH.  She had an echo 04/2016 that revealed LVEF 65-70% and mild basal septum hypertrophy.  She had a repeat echo 12/01/16 that showed LVEF 65-70% and severe LVH.  She reports that she was initially diagnosed with hypertension in high school.  The only itme it was well-controlled was when she was pregnant. She has several family members with hypertension.  Her mother had a heart attack at age 36 and several other family members had premature CAD.  Carvedilol was increased, amlodipine and doxazosin were added.    At her last appointment Christina Orozco's BP was controlled but she had edema so amlodipine was reduced and doxazosin was increased.  She was subsequently admitted 07/2017 with an acute stroke of the left paramedian pons.  She presented with dizziness and her needed admission blood pressure was 148/85.  Carotid Dopplers revealed 1-39% ICA stenosis and echo was unchanged from 11/2016.  She was scheduled for TEE 08/2017 that revealed no PFO.  A loop recorder was placed at that time.  She has been feeling okay.  She has no chest pain or pressure but does have exertional dyspnea that she thinks is getting progressively worse.  She has mild lower extremity edema but no orthopnea or PND.  Her blood pressure has been labile.  It is been as high as 789 systolic.  Recently it has been checked by an in-home nurse and physical therapist and she reports it has been well-controlled.  She has not yet taken her medication today.  She has  been under a lot of stress because her car is not working and her son is enrolling in the [employer].   Past Medical History:  Diagnosis Date  . Anemia    severe  . Asthma   . CKD (chronic kidney disease)    stage IV  . DDD (degenerative disc disease), cervical   . Diabetes mellitus ([place])   . Dislocated shoulder    right  . GERD (gastroesophageal reflux disease)   . Hypertension   . Neuropathy   . Stroke [facility])     Past Surgical History:  Procedure Laterality Date  . ADENOIDECTOMY    . APPENDECTOMY    . CERVICAL SPINE SURGERY  06/2012   C5-C6  . EYE SURGERY     left eye surgery   . FOOT SURGERY    . LOOP RECORDER INSERTION N/A 08/27/2017   Procedure: LOOP RECORDER INSERTION;  Surgeon: Sanda Klein, MD;  Location: [place] [facility];  Service: Cardiovascular;  Laterality: N/A;  . SHOULDER SURGERY    . TEE WITHOUT CARDIOVERSION N/A 08/27/2017   Procedure: TRANSESOPHAGEAL ECHOCARDIOGRAM (TEE);  Surgeon: Sanda Klein, MD;  Location: [facility] ENDOSCOPY;  Service: Cardiovascular;  Laterality: N/A;  . TONSILLECTOMY       Current Outpatient Medications  Medication Sig Dispense Refill  . albuterol (PROVENTIL HFA;VENTOLIN HFA) 108 (90 Base) MCG/ACT inhaler INHALE 1 OR 2 PUFFS INTO THE LUNGS EVERY 6 HOURS AS NEEDED FOR WHEEZING  OR SHORTNESS OF BREATH 8.5 g 0  . amLODipine (NORVASC) 5 MG tablet Take 1.5 tablets (7.5 mg total) by mouth daily. 45 tablet 5  . aspirin 325 MG tablet Take 325 mg by mouth daily.    Marland Kitchen atorvastatin (LIPITOR) 40 MG tablet Take 1 tablet (40 mg total) by mouth daily at 6 PM. 30 tablet 5  . carvedilol (COREG) 25 MG tablet Take 1 tablet (25 mg total) by mouth 2 (two) times daily with a meal. Take 25 mg twice daily (Patient taking differently: Take 25 mg by mouth 2 (two) times daily with a meal. ) 180 tablet 3  . clopidogrel (PLAVIX) 75 MG tablet Take 1 tablet (75 mg total) by mouth daily. 30 tablet 4  . doxazosin (CARDURA) 4 MG tablet Take 1 tablet (4 mg total) by mouth  daily. 90 tablet 1  . ferrous sulfate 325 (65 FE) MG tablet Take 1 tablet (325 mg total) by mouth daily. 30 tablet 3  . glucose blood (CONTOUR NEXT TEST) test strip 1 each by Other route 2 (two) times daily. And lancets 2/day 100 each 12  . hydrALAZINE (APRESOLINE) 50 MG tablet Take 1 tablet (50 mg total) by mouth 3 (three) times daily. 90 tablet 5  . hydrochlorothiazide (HYDRODIURIL) 25 MG tablet Take 25 mg by mouth 3 (three) times a week. Monday, Wednesday, Friday    . Insulin Glargine (LANTUS SOLOSTAR) 100 UNIT/ML Solostar Pen Inject 70 Units into the skin every morning. And pen needles 1/day 10 pen PRN  . meclizine (ANTIVERT) 25 MG tablet Take 1 tablet (25 mg total) by mouth every 8 (eight) hours as needed for dizziness (For dizziness). (Patient taking differently: Take 25 mg by mouth every 8 (eight) hours. ) 90 tablet 0   No current facility-administered medications for this visit.     Allergies:   Lisinopril; Penicillins; and Gabapentin    Social History:  The patient  reports that she has never smoked. She has never used smokeless tobacco. She reports that she does not drink alcohol or use drugs.   Family History:  The patient's family history includes Breast cancer (age of onset: 4) in her sister; CAD in her mother; Cancer in her unknown relative; Diabetes in her unknown relative; Heart disease in her unknown relative; Heart failure in her mother; Seizures in her unknown relative; Vascular Disease in her mother.    ROS:  Please see the history of present illness.   Otherwise, review of systems are positive for none.   All other systems are reviewed and negative.    PHYSICAL EXAM: VS:  BP 138/72   Pulse 78   Ht 5' 2.5" (1.588 m)   Wt 208 lb 3.2 oz (94.4 kg)   BMI 37.47 kg/m  , BMI Body mass index is 37.47 kg/m. GENERAL:  Well appearing HEENT: Pupils equal round and reactive, fundi not visualized, oral mucosa unremarkable NECK:  No jugular venous distention, waveform within  normal limits, carotid upstroke brisk and symmetric, no bruits, + thyromegaly LYMPHATICS:  No cervical adenopathy LUNGS:  Clear to auscultation bilaterally HEART:  RRR.  PMI not displaced or sustained,S1 and S2 within normal limits, no S3, no S4, no clicks, no rubs, no murmurs ABD:  Flat, positive bowel sounds normal in frequency in pitch, no bruits, no rebound, no guarding, no midline pulsatile mass, no hepatomegaly, no splenomegaly EXT:  2 plus pulses throughout, no edema, no cyanosis no clubbing SKIN:  No rashes no nodules NEURO:  Cranial nerves  II through XII grossly intact, motor grossly intact throughout [facility]:  Cognitively intact, oriented to person place and time   EKG:  EKG is ordered today. The ekg ordered 12/14/16 demonstrates sinus rhythm.  Rate 75 bpm.  Cannot rule out prior septal infarct.   09/18/17: Sinus rhythm.  Rate 78 bpm.  Cannot rule out prior septal infarct.   Recent Labs: 07/26/2017: ALT 11 07/27/2017: TSH 1.883 08/20/2017: BUN 35; Creatinine, Ser 1.92; Hemoglobin 9.8; Platelets 308; Potassium 4.1; Sodium 141    Lipid Panel    Component Value Date/Time   CHOL 99 07/26/2017 1118   TRIG 70 07/26/2017 1118   HDL 42 07/26/2017 1118   CHOLHDL 2.4 07/26/2017 1118   VLDL 14 07/26/2017 1118   LDLCALC 43 07/26/2017 1118      Wt Readings from Last 3 Encounters:  09/18/17 208 lb 3.2 oz (94.4 kg)  09/12/17 205 lb (93 kg)  09/10/17 205 lb 12.8 oz (93.4 kg)      ASSESSMENT AND PLAN:  # Hypertensive heart disease:  Blood pressure is better today but has been high at home.  Continue amlodipine, carvedilol, doxazosin, HCTZ and increase hydralazine to 50mg  tid.    # Acute on chronic renal failure: Renal function was elevated at her last appointment.  We will repeat a BMP today noted HCTZ has been reduced.  She notes that she is scheduled to see a nephrologist for the first time later this month.  # Recurrent stroke: BP control as above.  Continue atorvastatin and  aspirin.    Current medicines are reviewed at length with the patient today.  The patient does not have concerns regarding medicines.  The following changes have been made:    Labs/ tests ordered today include:   Orders Placed This Encounter  Procedures  . MYOCARDIAL PERFUSION IMAGING  . EKG 12-Lead     Disposition:   FU with Christina Prentiss C. Oval Linsey, MD, [facility] in 6 weeks.     Signed, Christina Fehnel C. Oval Linsey, MD, [facility]  09/18/2017 10:12 AM    [place]

## 2017-09-18 NOTE — Patient Instructions (Addendum)
Medication Instructions:  INCREASE YOUR HYDRALAZINE TO 3 TIMES A DAY  Labwork: NONE  Testing/Procedures: Your physician has requested that you have a lexiscan myoview. For further information please visit [email or web address]. Please follow instruction sheet, as given. 2 DAY STUDY   Follow-Up: Your physician recommends that you schedule a follow-up appointment in: 2 MONTHS   If you need a refill on your cardiac medications before your next appointment, please call your pharmacy.

## 2017-09-18 NOTE — Addendum Note (Signed)
Addended by: Alvina Filbert B on: 09/18/2017 12:01 PM   Modules accepted: Orders

## 2017-09-23 ENCOUNTER — Other Ambulatory Visit: Payer: Self-pay | Admitting: Family Medicine

## 2017-09-23 DIAGNOSIS — J4520 Mild intermittent asthma, uncomplicated: Secondary | ICD-10-CM

## 2017-09-25 ENCOUNTER — Ambulatory Visit
Admission: RE | Admit: 2017-09-25 | Discharge: 2017-09-25 | Disposition: A | Discharge status: Home / Self Care | Payer: BLUE CROSS/BLUE SHIELD | Source: Ambulatory Visit | Attending: Nephrology | Admitting: Nephrology

## 2017-09-25 ENCOUNTER — Ambulatory Visit (INDEPENDENT_AMBULATORY_CARE_PROVIDER_SITE_OTHER): Payer: BLUE CROSS/BLUE SHIELD | Admitting: Endocrinology

## 2017-09-25 ENCOUNTER — Encounter: Payer: Self-pay | Admitting: Endocrinology

## 2017-09-25 VITALS — BP 132/82 | HR 82 | Resp 16 | Wt 211.1 lb

## 2017-09-25 DIAGNOSIS — E118 Type 2 diabetes mellitus with unspecified complications: Secondary | ICD-10-CM | POA: Diagnosis not present

## 2017-09-25 DIAGNOSIS — E1122 Type 2 diabetes mellitus with diabetic chronic kidney disease: Secondary | ICD-10-CM

## 2017-09-25 DIAGNOSIS — Z9641 Presence of insulin pump (external) (internal): Secondary | ICD-10-CM

## 2017-09-25 DIAGNOSIS — N183 Chronic kidney disease, stage 3 unspecified: Secondary | ICD-10-CM

## 2017-09-25 DIAGNOSIS — Z794 Long term (current) use of insulin: Secondary | ICD-10-CM

## 2017-09-25 DIAGNOSIS — I129 Hypertensive chronic kidney disease with stage 1 through stage 4 chronic kidney disease, or unspecified chronic kidney disease: Secondary | ICD-10-CM

## 2017-09-25 MED ORDER — INSULIN GLARGINE 100 UNIT/ML SOLOSTAR PEN: 60.0000 [IU] | PEN_INJECTOR | SUBCUTANEOUS | 99 refills | Status: DC

## 2017-09-25 NOTE — Progress Notes (Signed)
Subjective:    Patient ID: Christina Orozco, female    DOB: [birth year], 47 y.o.   MRN: [id number]  HPI Pt returns for f/u of diabetes mellitus: DM type: Insulin-requiring type 2.   Dx'ed: 0349 Complications: polyneuropathy, PDR, renal failure, and CVA Therapy: insulin since soon after dx GDM: never DKA: never Severe hypoglycemia: never Pancreatitis: never Pancreatic imaging: normal on 2018 CT Other: she takes QD insulin, after poor results with multiple daily injections.  Pt reports symptoms: none today CBG's are reported by pt today.  Pt takes meds as rx'ed. She states cbg's vary from 45-226.  It is in general lowest in the middle of the night.   Past Medical History:  Diagnosis Date  . Anemia    severe  . Asthma   . CKD (chronic kidney disease)    stage IV  . DDD (degenerative disc disease), cervical   . Diabetes mellitus ([place])   . Dislocated shoulder    right  . GERD (gastroesophageal reflux disease)   . Hypertension   . Neuropathy   . Stroke [facility])     Past Surgical History:  Procedure Laterality Date  . ADENOIDECTOMY    . APPENDECTOMY    . CERVICAL SPINE SURGERY  06/2012   C5-C6  . EYE SURGERY     left eye surgery   . FOOT SURGERY    . LOOP RECORDER INSERTION N/A 08/27/2017   Procedure: LOOP RECORDER INSERTION;  Surgeon: Sanda Klein, MD;  Location: [place] [facility];  Service: Cardiovascular;  Laterality: N/A;  . SHOULDER SURGERY    . TEE WITHOUT CARDIOVERSION N/A 08/27/2017   Procedure: TRANSESOPHAGEAL ECHOCARDIOGRAM (TEE);  Surgeon: Sanda Klein, MD;  Location: [facility] ENDOSCOPY;  Service: Cardiovascular;  Laterality: N/A;  . TONSILLECTOMY      Social History   Socioeconomic History  . Marital status: Single    Spouse name: Not on file  . Number of children: 3  . Years of education: 12+  . Highest education level: Not on file  Occupational History  . Not on file  Social Needs  . Financial resource strain: Not on file  . Food insecurity:   Worry: Not on file    Inability: Not on file  . Transportation needs:    Medical: Not on file    Non-medical: Not on file  Tobacco Use  . Smoking status: Never Smoker  . Smokeless tobacco: Never Used  Substance and Sexual Activity  . Alcohol use: No  . Drug use: No  . Sexual activity: Not on file  Lifestyle  . Physical activity:    Days per week: Not on file    Minutes per session: Not on file  . Stress: Not on file  Relationships  . Social connections:    Talks on phone: Not on file    Gets together: Not on file    Attends religious service: Not on file    Active member of club or organization: Not on file    Attends meetings of clubs or organizations: Not on file    Relationship status: Not on file  . Intimate partner violence:    Fear of current or ex partner: Not on file    Emotionally abused: Not on file    Physically abused: Not on file    Forced sexual activity: Not on file  Other Topics Concern  . Not on file  Social History Narrative   Lives with daughter   Caffeine use: 3 cups daily  Right handed    Current Outpatient Medications on File Prior to Visit  Medication Sig Dispense Refill  . amLODipine (NORVASC) 5 MG tablet Take 1.5 tablets (7.5 mg total) by mouth daily. 45 tablet 5  . atorvastatin (LIPITOR) 40 MG tablet Take 1 tablet (40 mg total) by mouth daily at 6 PM. 30 tablet 5  . carvedilol (COREG) 25 MG tablet Take 1 tablet (25 mg total) by mouth 2 (two) times daily with a meal. Take 25 mg twice daily (Patient taking differently: Take 25 mg by mouth 2 (two) times daily with a meal. ) 180 tablet 3  . clopidogrel (PLAVIX) 75 MG tablet Take 1 tablet (75 mg total) by mouth daily. 30 tablet 4  . doxazosin (CARDURA) 4 MG tablet Take 1 tablet (4 mg total) by mouth daily. 90 tablet 1  . ferrous sulfate 325 (65 FE) MG tablet Take 1 tablet (325 mg total) by mouth daily. 30 tablet 3  . glucose blood (CONTOUR NEXT TEST) test strip 1 each by Other route 2 (two) times  daily. And lancets 2/day 100 each 12  . hydrALAZINE (APRESOLINE) 50 MG tablet Take 1 tablet (50 mg total) by mouth 3 (three) times daily. 90 tablet 5  . hydrochlorothiazide (HYDRODIURIL) 25 MG tablet Take 25 mg by mouth 3 (three) times a week. Monday, Wednesday, Friday    . meclizine (ANTIVERT) 25 MG tablet Take 1 tablet (25 mg total) by mouth every 8 (eight) hours as needed for dizziness (For dizziness). (Patient taking differently: Take 25 mg by mouth every 8 (eight) hours. ) 90 tablet 0  . PROAIR HFA 108 (90 Base) MCG/ACT inhaler INHALE 1 OR 2 PUFFS INTO THE LUNGS EVERY 6 HOURS AS NEEDED FOR WHEEZING OR SHORTNESS OF BREATH 8.5 g 0   No current facility-administered medications on file prior to visit.     Allergies  Allergen Reactions  . Lisinopril Swelling and Other (See Comments)    Facial swelling   . Penicillins Hives    Has patient had a PCN reaction causing immediate rash, facial/tongue/throat swelling, SOB or lightheadedness with hypotension: yes Has patient had a PCN reaction causing severe rash involving mucus membranes or skin necrosis: No Has patient had a PCN reaction that required hospitalization No Has patient had a PCN reaction occurring within the last 10 years: No If all of the above answers are "NO", then may proceed wit  . Gabapentin Swelling and Other (See Comments)    "I thought I was having another stroke."     Family History  Problem Relation Age of Onset  . Vascular Disease Mother   . CAD Mother   . Heart failure Mother   . Heart disease Unknown   . Cancer Unknown        colon, stomach, lung  . Diabetes Unknown   . Seizures Unknown   . Breast cancer Sister 30    BP 132/82   Pulse 82   Resp 16   Wt 211 lb 2 oz (95.8 kg)   SpO2 98%   BMI 38.00 kg/m  ' Review of Systems Denies LOC    Objective:   Physical Exam VITAL SIGNS:  See vs page GENERAL: no distress Pulses: dorsalis pedis intact bilat.   MSK: no deformity of the feet CV: trace bilat  leg edema Skin:  no ulcer on the feet.  normal color and temp on the feet. Neuro: sensation is intact to touch on the feet   Lab Results  Component  Value Date   CREATININE 1.92 (H) 08/20/2017   BUN 35 (H) 08/20/2017   NA 141 08/20/2017   K 4.1 08/20/2017   CL 104 08/20/2017   CO2 20 08/20/2017       Assessment & Plan:  Insulin-requiring type 2 DM, with PDR: overcontrolled.  Hypoglycemia: new.  Renal failure: in this setting, she may need a faster-acting qd insulin, but we'll try reducing lantus first.   Patient Instructions  check your blood sugar twice a day.  vary the time of day when you check, between before the 3 meals, and at bedtime.  also check if you have symptoms of your blood sugar being too high or too low.  please keep a record of the readings and bring it to your next appointment here (or you can bring the meter itself).  You can write it on any piece of paper.  please call us sooner if your blood sugar goes below 70, or if you have a lot of readings over 200. Please reduce the lantus to 60 units each morning.  Please come back for a follow-up appointment in 6 weeks.

## 2017-09-25 NOTE — Patient Instructions (Addendum)
check your blood sugar twice a day.  vary the time of day when you check, between before the 3 meals, and at bedtime.  also check if you have symptoms of your blood sugar being too high or too low.  please keep a record of the readings and bring it to your next appointment here (or you can bring the meter itself).  You can write it on any piece of paper.  please call us sooner if your blood sugar goes below 70, or if you have a lot of readings over 200. Please reduce the lantus to 60 units each morning.  Please come back for a follow-up appointment in 6 weeks.

## 2017-09-25 NOTE — Discharge Instructions (Signed)

## 2017-09-26 ENCOUNTER — Ambulatory Visit (HOSPITAL_COMMUNITY)
Admission: RE | Admit: 2017-09-26 | Discharge: 2017-09-26 | Disposition: A | Discharge status: Home / Self Care | Payer: BLUE CROSS/BLUE SHIELD | Source: Ambulatory Visit | Attending: Nephrology | Admitting: Nephrology

## 2017-09-26 ENCOUNTER — Telehealth (HOSPITAL_COMMUNITY): Payer: Self-pay

## 2017-09-26 ENCOUNTER — Ambulatory Visit (INDEPENDENT_AMBULATORY_CARE_PROVIDER_SITE_OTHER): Payer: BLUE CROSS/BLUE SHIELD | Admitting: *Deleted

## 2017-09-26 ENCOUNTER — Encounter: Payer: BLUE CROSS/BLUE SHIELD | Admitting: Vascular Surgery

## 2017-09-26 ENCOUNTER — Encounter (HOSPITAL_COMMUNITY): Payer: BLUE CROSS/BLUE SHIELD

## 2017-09-26 VITALS — BP 141/71 | HR 81 | Temp 98.3°F | Resp 18

## 2017-09-26 DIAGNOSIS — I639 Cerebral infarction, unspecified: Secondary | ICD-10-CM | POA: Diagnosis not present

## 2017-09-26 DIAGNOSIS — I10 Essential (primary) hypertension: Secondary | ICD-10-CM | POA: Diagnosis present

## 2017-09-26 DIAGNOSIS — E7800 Pure hypercholesterolemia, unspecified: Secondary | ICD-10-CM | POA: Diagnosis present

## 2017-09-26 DIAGNOSIS — R0602 Shortness of breath: Secondary | ICD-10-CM | POA: Insufficient documentation

## 2017-09-26 DIAGNOSIS — N183 Chronic kidney disease, stage 3 unspecified: Secondary | ICD-10-CM

## 2017-09-26 DIAGNOSIS — D631 Anemia in chronic kidney disease: Secondary | ICD-10-CM | POA: Insufficient documentation

## 2017-09-26 LAB — POCT HEMOGLOBIN-HEMACUE: Hemoglobin: 8.6 g/dL — ABNORMAL LOW (ref 12.0–15.0)

## 2017-09-26 MED ORDER — DARBEPOETIN ALFA 200 MCG/0.4ML IJ SOSY
PREFILLED_SYRINGE | INTRAMUSCULAR | Status: AC
  Filled 2017-09-26: qty 0.4

## 2017-09-26 MED ORDER — DARBEPOETIN ALFA 100 MCG/0.5ML IJ SOSY: 100.0000 ug | PREFILLED_SYRINGE | INTRAMUSCULAR | Status: DC

## 2017-09-26 MED ORDER — DARBEPOETIN ALFA 100 MCG/0.5ML IJ SOSY
PREFILLED_SYRINGE | INTRAMUSCULAR | Status: AC
  Administered 2017-09-26: 100 ug via SUBCUTANEOUS
  Filled 2017-09-26: qty 0.5

## 2017-09-26 NOTE — Telephone Encounter (Signed)
Encounter complete. 

## 2017-09-27 ENCOUNTER — Encounter: Payer: Self-pay | Admitting: Cardiovascular Disease

## 2017-09-27 ENCOUNTER — Ambulatory Visit (HOSPITAL_BASED_OUTPATIENT_CLINIC_OR_DEPARTMENT_OTHER)
Admission: RE | Admit: 2017-09-27 | Discharge: 2017-09-27 | Disposition: A | Discharge status: Home / Self Care | Payer: BLUE CROSS/BLUE SHIELD | Source: Ambulatory Visit | Attending: Cardiovascular Disease | Admitting: Cardiovascular Disease

## 2017-09-27 DIAGNOSIS — R0602 Shortness of breath: Secondary | ICD-10-CM

## 2017-09-27 DIAGNOSIS — I10 Essential (primary) hypertension: Secondary | ICD-10-CM

## 2017-09-27 DIAGNOSIS — E7800 Pure hypercholesterolemia, unspecified: Secondary | ICD-10-CM

## 2017-09-27 MED ORDER — TECHNETIUM TC 99M TETROFOSMIN IV KIT
28.7000 | PACK | Freq: Once | INTRAVENOUS | Status: AC | PRN
  Administered 2017-09-27: 28.7 via INTRAVENOUS
  Filled 2017-09-27: qty 29

## 2017-09-27 MED ORDER — REGADENOSON 0.4 MG/5ML IV SOLN
0.4000 mg | Freq: Once | INTRAVENOUS | Status: AC
  Administered 2017-09-27: 0.4 mg via INTRAVENOUS

## 2017-09-27 NOTE — Progress Notes (Signed)
Carelink Summary Report / Loop Recorder 

## 2017-09-28 ENCOUNTER — Ambulatory Visit (HOSPITAL_COMMUNITY)
Admission: RE | Admit: 2017-09-28 | Discharge: 2017-09-28 | Disposition: A | Discharge status: Home / Self Care | Payer: BLUE CROSS/BLUE SHIELD | Source: Ambulatory Visit | Attending: Cardiology | Admitting: Cardiology

## 2017-09-28 DIAGNOSIS — E119 Type 2 diabetes mellitus without complications: Secondary | ICD-10-CM | POA: Insufficient documentation

## 2017-09-28 LAB — MYOCARDIAL PERFUSION IMAGING
CHL CUP NUCLEAR SDS: 2
CHL CUP NUCLEAR SRS: 1
CHL CUP RESTING HR STRESS: 78 {beats}/min
LV dias vol: 98 mL (ref 46–106)
LVSYSVOL: 38 mL
NUC STRESS TID: 0.8
Peak HR: 91 {beats}/min
SSS: 3

## 2017-09-28 MED ORDER — TECHNETIUM TC 99M TETROFOSMIN IV KIT
30.2000 | PACK | Freq: Once | INTRAVENOUS | Status: AC | PRN
  Administered 2017-09-28: 30.2 via INTRAVENOUS

## 2017-10-13 ENCOUNTER — Other Ambulatory Visit: Payer: Self-pay | Admitting: Family Medicine

## 2017-10-13 DIAGNOSIS — E785 Hyperlipidemia, unspecified: Secondary | ICD-10-CM

## 2017-10-15 ENCOUNTER — Other Ambulatory Visit: Payer: Self-pay | Admitting: Family Medicine

## 2017-10-15 DIAGNOSIS — R42 Dizziness and giddiness: Secondary | ICD-10-CM

## 2017-10-15 DIAGNOSIS — H8190 Unspecified disorder of vestibular function, unspecified ear: Secondary | ICD-10-CM

## 2017-10-23 ENCOUNTER — Ambulatory Visit (HOSPITAL_COMMUNITY)
Admission: RE | Admit: 2017-10-23 | Discharge: 2017-10-23 | Disposition: A | Discharge status: Home / Self Care | Payer: BLUE CROSS/BLUE SHIELD | Source: Ambulatory Visit | Attending: Nephrology | Admitting: Nephrology

## 2017-10-23 VITALS — BP 143/89 | HR 79 | Temp 98.4°F | Resp 18

## 2017-10-23 DIAGNOSIS — D631 Anemia in chronic kidney disease: Secondary | ICD-10-CM | POA: Diagnosis present

## 2017-10-23 DIAGNOSIS — N183 Chronic kidney disease, stage 3 unspecified: Secondary | ICD-10-CM

## 2017-10-23 LAB — POCT HEMOGLOBIN-HEMACUE: Hemoglobin: 10.1 g/dL — ABNORMAL LOW (ref 12.0–15.0)

## 2017-10-23 MED ORDER — DARBEPOETIN ALFA 100 MCG/0.5ML IJ SOSY
PREFILLED_SYRINGE | INTRAMUSCULAR | Status: AC
  Administered 2017-10-23: 100 ug via SUBCUTANEOUS
  Filled 2017-10-23: qty 0.5

## 2017-10-23 MED ORDER — DARBEPOETIN ALFA 100 MCG/0.5ML IJ SOSY
100.0000 ug | PREFILLED_SYRINGE | INTRAMUSCULAR | Status: DC
  Administered 2017-10-23: 100 ug via SUBCUTANEOUS

## 2017-10-24 ENCOUNTER — Encounter (HOSPITAL_COMMUNITY): Payer: BLUE CROSS/BLUE SHIELD

## 2017-10-25 ENCOUNTER — Ambulatory Visit (INDEPENDENT_AMBULATORY_CARE_PROVIDER_SITE_OTHER): Payer: BLUE CROSS/BLUE SHIELD | Admitting: Neurology

## 2017-10-25 ENCOUNTER — Encounter: Payer: Self-pay | Admitting: Neurology

## 2017-10-25 DIAGNOSIS — G5603 Carpal tunnel syndrome, bilateral upper limbs: Secondary | ICD-10-CM

## 2017-10-25 DIAGNOSIS — Z0289 Encounter for other administrative examinations: Secondary | ICD-10-CM

## 2017-10-26 LAB — CUP PACEART REMOTE DEVICE CHECK
Date Time Interrogation Session: 20190417163921
Implantable Pulse Generator Implant Date: 20190318

## 2017-10-29 ENCOUNTER — Ambulatory Visit (INDEPENDENT_AMBULATORY_CARE_PROVIDER_SITE_OTHER): Payer: BLUE CROSS/BLUE SHIELD | Admitting: *Deleted

## 2017-10-29 DIAGNOSIS — I639 Cerebral infarction, unspecified: Secondary | ICD-10-CM | POA: Diagnosis not present

## 2017-10-29 NOTE — Progress Notes (Signed)
Discussed Carpal Tunnel syndrome with patient, etiology, physiology, therapeutic options including surgical options. Also discussed her widespread polyneuropathy due to her uncontrolled diabetes. Again, advised patient to be compliant with medications and follow closely with primary care for her diabetes which has caused her multiple other sequelae (last HgbA1c 12.5).  Discussed that CTS surgery is less successful with her comorbid polyneuropathy. She would like surgery and will refer to Orthopaedics.    A total of 15 minutes was spent face-to-face with this patient. Over half this time was spent on counseling patient on the CTS diagnosis and different  therapeutic options, counseling and coordination of care, risks ans benefits of management, compliance, or risk factor reduction and education.  This does not include any time spent on emg/ncs.  Orders Placed This Encounter  Procedures  . Ambulatory referral to Orthopedic Surgery

## 2017-10-29 NOTE — Progress Notes (Signed)
See procedure note.

## 2017-10-29 NOTE — Progress Notes (Signed)
Full Name: Christina Orozco Gender: Female MRN #: [id number] Date of Birth: [birth year]    Visit Date: 10/25/17 10:04 Age: 47 Years 63 Months Old Examining Physician: Sarina Ill, MD  Referring Physician: Jaynee Eagles, MD    History: 47 year old with uncontrolled Diabetes and bilateral Carpal Tunnel Syndrome  Summary: EMG/NCS was performed on the upper extremities  Summary:   Nerve Conduction Studies were performed on the bilateral upper extremities.  The left median APB motor nerve showed prolonged distal onset latency (5.5 ms, N<4.4)  The right median APB motor nerve showed prolonged distal onset latency (5.2 ms, N<4.4)  The left median APB motor nerve Wrist/Palm comparison showed prolonged distal onset latency (5.5 ms, N<4.4), reduced amplitude (1.1uv, N>4, wrist) and 70% conduction block across the wrist  The right median APB motor nerve Wrist/Palm comparison showed prolonged distal onset latency (6.3 ms, N<4.4), reduced amplitude (0.7uv, N>4, wrist) and nearly complete conduction block across the wrist  The left Ulnar ADM motor nerve showed prolonged distal onset latency (3.5 ms, N<3.3) and reduced amplitude (3.4uV, N>6) The right Ulnar ADM motor nerve showed prolonged distal onset latency (3.1 ms, N<3.3) and reduced amplitude (5.5uV, N>6) The left Radial motor nerve showed delayed peak latency(2.18ms, N<2.9) The right Radial motor nerve showed delayed peak latency(2.77ms, N<2.9) The right Median 2nd Digit orthodromic sensory nerve showed prolonged distal peak latency (4.6 ms, N<3.4) and reduced amplitude(2 uV, N>10) The left  Median 2nd Digit orthodromic sensory nerve showed prolonged distal peak latency (4.3 ms, N<3.4) and reduced amplitude(5 uV, N>10) The left Ulnar 5th Digit orthodromic sensory nerve showed No Response F Wave studies indicate that the right Ulnar F wave has delayed latency(32.6, N<41ms). F Wave studies indicate that the left Ulnar F wave has delayed  latency(34.6, N<6ms).  The right median/ulnar (palm) comparison nerve showed prolonged distal peak latency (Median Palm, 3.4 ms, N<2.2) and abnormal peak latency difference (Median Palm-Ulnar Palm, 0.4 ms, N<0.4) with a relative median delay.    The left median/radial (palm) comparison nerve showed  abnormal peak latency difference (Radial Palm-Ulnar Palm, 1.8 ms, N<0.5) with a relative median delay.    All remaining nerves within normal limits.  The left opponens Pollicis muscle showed reduced motor unit recruitment. All remaining nerves within normal limits   Conclusion: This is an abnormal study.   1. There is electrophysiologic evidence of bilateral moderately-severe Carpal Tunnel Syndrome best represented above by the by significant conduction block across the wrists (>70% decrease in amplitude with proximal conduction as compared to the distal amplitudes bilaterally).   .   2. 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 3. No suggestion of radiculopathy.   Sarina Ill M.D.  [facility] [place], [place] [address] Tel: [phone] Fax: [phone]        [facility]    Nerve / Sites Muscle Latency Ref. Amplitude Ref. Rel Amp Segments Distance Velocity Ref. Area    ms ms mV mV %  cm m/s m/s mVms  L Median - APB     Wrist APB 5.5 ?4.4 5.1 ?4.0 100 Wrist - APB 7   18.3     Upper arm APB 9.8  5.1  100 Upper arm - Wrist 21 49 ?49 17.9  R Median - APB     Wrist APB 5.2 ?4.4 4.5 ?4.0 100 Wrist - APB 7   18.1     Upper arm APB 10.1  4.1  91.6 Upper arm - Wrist 22 45 ?49 17.2  L Median - APB     Wrist APB 5.5 ?4.4 1.1 ?4.0 100 Wrist - APB 7   4.0     PALM APB 2.4  3.4  321 Upper arm - Wrist   ?49 11.7  R Median - APB     Wrist APB 6.3 ?4.4 0.7 ?4.0 100 Wrist - APB 7   3.0     PALM APB 2.6  3.3  464 Upper arm - Wrist   ?49 11.2  L Ulnar - ADM     Wrist ADM 3.5 ?3.3 3.4 ?6.0 100 Wrist - ADM 7   8.6     B.Elbow  ADM 8.6  2.6  75.6 B.Elbow - Wrist 20 40 ?49 8.0     A.Elbow ADM 11.2  2.3  88.1 A.Elbow - B.Elbow 10 38 ?49 7.7         A.Elbow - Wrist      R Ulnar - ADM     Wrist ADM 3.1 ?3.3 5.5 ?6.0 100 Wrist - ADM 7   17.6     B.Elbow ADM 7.6  5.4  98.6 B.Elbow - Wrist 20 45 ?49 17.1     A.Elbow ADM 9.7  5.0  92.8 A.Elbow - B.Elbow 10 47 ?49 16.4         A.Elbow - Wrist                            SNC    Nerve / Sites Rec. Site Peak Lat Ref.  Amp Ref. Segments Distance Peak Diff Ref.    ms ms V V  cm ms ms  L Radial - Anatomical snuff box (Forearm)     Forearm Wrist 2.6 ?2.9 16 ?15 Forearm - Wrist 10    R Radial - Anatomical snuff box (Forearm)     Forearm Wrist 2.6 ?2.9 17 ?15 Forearm - Wrist 10    R Median, Ulnar - Transcarpal comparison     Median Palm Wrist 3.4 ?2.2 7 ?35 Median Palm - Wrist 8       Ulnar Palm Wrist 3.0 ?2.2 4 ?12 Ulnar Palm - Wrist 8          Median Palm - Ulnar Palm  0.4 ?0.4  L Median, Radial - Thumb comparison     Median Wrist Thumb 4.5  7  Median Wrist - Thumb 10       Radial Wrist Thumb 2.7  6  Radial Wrist - Thumb 10          Median Wrist - Radial Wrist  1.8 ?0.5  L Median - Orthodromic (Dig II, Mid palm)     Dig II Wrist 4.3 ?3.4 5 ?10 Dig II - Wrist 13    R Median - Orthodromic (Dig II, Mid palm)     Dig II Wrist 4.6 ?3.4 2 ?10 Dig II - Wrist 13    L Ulnar - Orthodromic, (Dig V, Mid palm)     Dig V Wrist NR ?3.1 NR ?5 Dig V - Wrist 11    R Ulnar - Orthodromic, (Dig V, Mid palm)     Dig V Wrist 3.1 ?3.1 6 ?5 Dig V - Wrist 11    L Dorsal ulnar cutaneous - Hand dorsum (Forearm)     Forearm Hand dorsum 2.7 ?2.5 4 ?8 Forearm - Hand dorsum 8    R Dorsal ulnar cutaneous -  Hand dorsum (Forearm)     Forearm Hand dorsum 2.3 ?2.5 5 ?8 Forearm - Hand dorsum 8                            F  Wave    Nerve F Lat Ref.   ms ms  L Ulnar - ADM 34.6 ?32.0  R Ulnar - ADM 32.6 ?32.0         EMG full       EMG       EMG Summary Table    Spontaneous MUAP  Recruitment  Muscle IA Fib PSW Fasc Other Amp Dur. Poly Pattern  Deltoid Normal None None None _______ Normal Normal Normal Normal  L. Triceps brachii Normal None None None _______ Normal Normal Normal Normal  R. Triceps brachii Normal None None None _______ Normal Normal Normal Normal  L. Pronator teres Normal None None None _______ Normal Normal Normal Normal  R. Pronator teres Normal None None None _______ Normal Normal Normal Normal  L. First dorsal interosseous Normal None None None _______ Normal Normal Normal Normal  R. First dorsal interosseous Normal None None None _______ Normal Normal Normal Normal  L. Opponens pollicis Normal None None None _______ Normal Normal Normal Reduced  R. Opponens pollicis Normal None None None _______ Normal Normal Normal Normal

## 2017-10-30 NOTE — Progress Notes (Signed)
Carelink Summary Report / Loop Recorder 

## 2017-10-30 NOTE — Procedures (Signed)
Full Name: Christina Orozco Gender: Female MRN #: [id number] Date of Birth: 1970-07-31    Visit Date: 10/25/17 10:04 Age: 47 Years 70 Months Old Examining Physician: Sarina Ill, MD  Referring Physician: Jaynee Eagles, MD    History: 47 year old with uncontrolled Diabetes and bilateral Carpal Tunnel Syndrome  Summary: EMG/NCS was performed on the upper extremities  Summary:   Nerve Conduction Studies were performed on the bilateral upper extremities.  The left median APB motor nerve showed prolonged distal onset latency (5.5 ms, N<4.4)  The right median APB motor nerve showed prolonged distal onset latency (5.2 ms, N<4.4)  The left median APB motor nerve Wrist/Palm comparison showed prolonged distal onset latency (5.5 ms, N<4.4), reduced amplitude (1.1uv, N>4, wrist) and 70% conduction block across the wrist  The right median APB motor nerve Wrist/Palm comparison showed prolonged distal onset latency (6.3 ms, N<4.4), reduced amplitude (0.7uv, N>4, wrist) and nearly complete conduction block across the wrist  The left Ulnar ADM motor nerve showed prolonged distal onset latency (3.5 ms, N<3.3) and reduced amplitude (3.4uV, N>6) The right Ulnar ADM motor nerve showed prolonged distal onset latency (3.1 ms, N<3.3) and reduced amplitude (5.5uV, N>6) The left Radial motor nerve showed delayed peak latency(2.59ms, N<2.9) The right Radial motor nerve showed delayed peak latency(2.79ms, N<2.9) The right Median 2nd Digit orthodromic sensory nerve showed prolonged distal peak latency (4.6 ms, N<3.4) and reduced amplitude(2 uV, N>10) The left  Median 2nd Digit orthodromic sensory nerve showed prolonged distal peak latency (4.3 ms, N<3.4) and reduced amplitude(5 uV, N>10) The left Ulnar 5th Digit orthodromic sensory nerve showed No Response F Wave studies indicate that the right Ulnar F wave has delayed latency(32.6, N<67ms). F Wave studies indicate that the left Ulnar F wave has delayed  latency(34.6, N<68ms).  The right median/ulnar (palm) comparison nerve showed prolonged distal peak latency (Median Palm, 3.4 ms, N<2.2) and abnormal peak latency difference (Median Palm-Ulnar Palm, 0.4 ms, N<0.4) with a relative median delay.    The left median/radial (palm) comparison nerve showed  abnormal peak latency difference (Radial Palm-Ulnar Palm, 1.8 ms, N<0.5) with a relative median delay.    All remaining nerves within normal limits.  The left opponens Pollicis muscle showed reduced motor unit recruitment. All remaining nerves within normal limits   Conclusion: This is an abnormal study.   1. There is electrophysiologic evidence of bilateral moderately-severe Carpal Tunnel Syndrome best represented above by the by significant conduction block across the wrists (>70% decrease in amplitude with proximal conduction as compared to the distal amplitudes bilaterally).   .   2. 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 3. No suggestion of radiculopathy.   Sarina Ill M.D.  [facility] [place], [place] [address] Tel: [phone] Fax: [phone]        [facility]    Nerve / Sites Muscle Latency Ref. Amplitude Ref. Rel Amp Segments Distance Velocity Ref. Area    ms ms mV mV %  cm m/s m/s mVms  L Median - APB     Wrist APB 5.5 ?4.4 5.1 ?4.0 100 Wrist - APB 7   18.3     Upper arm APB 9.8  5.1  100 Upper arm - Wrist 21 49 ?49 17.9  R Median - APB     Wrist APB 5.2 ?4.4 4.5 ?4.0 100 Wrist - APB 7   18.1     Upper arm APB 10.1  4.1  91.6 Upper arm - Wrist 22 45 ?49 17.2  L Median - APB     Wrist APB 5.5 ?4.4 1.1 ?4.0 100 Wrist - APB 7   4.0     PALM APB 2.4  3.4  321 Upper arm - Wrist   ?49 11.7  R Median - APB     Wrist APB 6.3 ?4.4 0.7 ?4.0 100 Wrist - APB 7   3.0     PALM APB 2.6  3.3  464 Upper arm - Wrist   ?49 11.2  L Ulnar - ADM     Wrist ADM 3.5 ?3.3 3.4 ?6.0 100 Wrist - ADM 7   8.6     B.Elbow  ADM 8.6  2.6  75.6 B.Elbow - Wrist 20 40 ?49 8.0     A.Elbow ADM 11.2  2.3  88.1 A.Elbow - B.Elbow 10 38 ?49 7.7         A.Elbow - Wrist      R Ulnar - ADM     Wrist ADM 3.1 ?3.3 5.5 ?6.0 100 Wrist - ADM 7   17.6     B.Elbow ADM 7.6  5.4  98.6 B.Elbow - Wrist 20 45 ?49 17.1     A.Elbow ADM 9.7  5.0  92.8 A.Elbow - B.Elbow 10 47 ?49 16.4         A.Elbow - Wrist                            SNC    Nerve / Sites Rec. Site Peak Lat Ref.  Amp Ref. Segments Distance Peak Diff Ref.    ms ms V V  cm ms ms  L Radial - Anatomical snuff box (Forearm)     Forearm Wrist 2.6 ?2.9 16 ?15 Forearm - Wrist 10    R Radial - Anatomical snuff box (Forearm)     Forearm Wrist 2.6 ?2.9 17 ?15 Forearm - Wrist 10    R Median, Ulnar - Transcarpal comparison     Median Palm Wrist 3.4 ?2.2 7 ?35 Median Palm - Wrist 8       Ulnar Palm Wrist 3.0 ?2.2 4 ?12 Ulnar Palm - Wrist 8          Median Palm - Ulnar Palm  0.4 ?0.4  L Median, Radial - Thumb comparison     Median Wrist Thumb 4.5  7  Median Wrist - Thumb 10       Radial Wrist Thumb 2.7  6  Radial Wrist - Thumb 10          Median Wrist - Radial Wrist  1.8 ?0.5  L Median - Orthodromic (Dig II, Mid palm)     Dig II Wrist 4.3 ?3.4 5 ?10 Dig II - Wrist 13    R Median - Orthodromic (Dig II, Mid palm)     Dig II Wrist 4.6 ?3.4 2 ?10 Dig II - Wrist 13    L Ulnar - Orthodromic, (Dig V, Mid palm)     Dig V Wrist NR ?3.1 NR ?5 Dig V - Wrist 11    R Ulnar - Orthodromic, (Dig V, Mid palm)     Dig V Wrist 3.1 ?3.1 6 ?5 Dig V - Wrist 11    L Dorsal ulnar cutaneous - Hand dorsum (Forearm)     Forearm Hand dorsum 2.7 ?2.5 4 ?8 Forearm - Hand dorsum 8    R Dorsal ulnar cutaneous -  Hand dorsum (Forearm)     Forearm Hand dorsum 2.3 ?2.5 5 ?8 Forearm - Hand dorsum 8                            F  Wave    Nerve F Lat Ref.   ms ms  L Ulnar - ADM 34.6 ?32.0  R Ulnar - ADM 32.6 ?32.0         EMG full       EMG       EMG Summary Table    Spontaneous MUAP  Recruitment  Muscle IA Fib PSW Fasc Other Amp Dur. Poly Pattern  Deltoid Normal None None None _______ Normal Normal Normal Normal  L. Triceps brachii Normal None None None _______ Normal Normal Normal Normal  R. Triceps brachii Normal None None None _______ Normal Normal Normal Normal  L. Pronator teres Normal None None None _______ Normal Normal Normal Normal  R. Pronator teres Normal None None None _______ Normal Normal Normal Normal  L. First dorsal interosseous Normal None None None _______ Normal Normal Normal Normal  R. First dorsal interosseous Normal None None None _______ Normal Normal Normal Normal  L. Opponens pollicis Normal None None None _______ Normal Normal Normal Reduced  R. Opponens pollicis Normal None None None _______ Normal Normal Normal Normal

## 2017-10-31 ENCOUNTER — Telehealth: Payer: Self-pay | Admitting: Neurology

## 2017-10-31 DIAGNOSIS — J45909 Unspecified asthma, uncomplicated: Secondary | ICD-10-CM | POA: Insufficient documentation

## 2017-10-31 DIAGNOSIS — K5900 Constipation, unspecified: Secondary | ICD-10-CM | POA: Insufficient documentation

## 2017-10-31 DIAGNOSIS — N200 Calculus of kidney: Secondary | ICD-10-CM | POA: Insufficient documentation

## 2017-10-31 DIAGNOSIS — M1990 Unspecified osteoarthritis, unspecified site: Secondary | ICD-10-CM | POA: Insufficient documentation

## 2017-10-31 NOTE — Telephone Encounter (Signed)
Christina Orozco with long term disability returned call. Discussed that letter was written by Dr. Jaynee Eagles on 09/12/17 stating pt can return to work at 30 hours per week no restrictions. Spoke with Dr. Jaynee Eagles. Patient can return to work full time as of today 10/31/2017 with no restrictions. Christina Orozco verbalized appreciation and understanding.

## 2017-10-31 NOTE — Telephone Encounter (Signed)
Called patient and advised that she call her PCP to be evaluated for causes of the dizziness that may not be neurologic in nature. Pt stated that she had called them all day today and it kept going to voicemail. RN advised that in that case she could try PCP once more but then should go to emergency department or urgent care for evaluation. Pt verbalized understanding.   This was discussed with Dr. Jaynee Eagles as well who advised PCP, ED or urgent care.

## 2017-10-31 NOTE — Telephone Encounter (Signed)
Pt called she did not work yesterday or today due to being dizzy when moving around. She is requesting a call back

## 2017-10-31 NOTE — Telephone Encounter (Signed)
Tracy[employer] (long term [phone] called inquiring if the patient has returned to work full time yet or what would be keeping her from working full time. Please call to advise.

## 2017-10-31 NOTE — Telephone Encounter (Signed)
Spoke with Dr. Jaynee Eagles. Dr. Jaynee Eagles did not put patient out of work. She is not aware of any reason why patient cannot work.   Called Tracy back and LVM asking for call back.

## 2017-11-01 ENCOUNTER — Emergency Department (HOSPITAL_COMMUNITY): Payer: BLUE CROSS/BLUE SHIELD

## 2017-11-01 ENCOUNTER — Telehealth: Payer: Self-pay | Admitting: Neurology

## 2017-11-01 ENCOUNTER — Emergency Department (HOSPITAL_COMMUNITY)
Admission: EM | Admit: 2017-11-01 | Discharge: 2017-11-02 | Disposition: A | Discharge status: Home / Self Care | Payer: BLUE CROSS/BLUE SHIELD | Attending: Emergency Medicine | Admitting: Emergency Medicine

## 2017-11-01 ENCOUNTER — Encounter (HOSPITAL_COMMUNITY): Payer: Self-pay | Admitting: Emergency Medicine

## 2017-11-01 DIAGNOSIS — Z7902 Long term (current) use of antithrombotics/antiplatelets: Secondary | ICD-10-CM | POA: Insufficient documentation

## 2017-11-01 DIAGNOSIS — Z794 Long term (current) use of insulin: Secondary | ICD-10-CM | POA: Insufficient documentation

## 2017-11-01 DIAGNOSIS — R42 Dizziness and giddiness: Secondary | ICD-10-CM | POA: Diagnosis not present

## 2017-11-01 DIAGNOSIS — R739 Hyperglycemia, unspecified: Secondary | ICD-10-CM

## 2017-11-01 DIAGNOSIS — J45909 Unspecified asthma, uncomplicated: Secondary | ICD-10-CM | POA: Diagnosis not present

## 2017-11-01 DIAGNOSIS — N184 Chronic kidney disease, stage 4 (severe): Secondary | ICD-10-CM | POA: Insufficient documentation

## 2017-11-01 DIAGNOSIS — R7989 Other specified abnormal findings of blood chemistry: Secondary | ICD-10-CM

## 2017-11-01 DIAGNOSIS — E1122 Type 2 diabetes mellitus with diabetic chronic kidney disease: Secondary | ICD-10-CM | POA: Insufficient documentation

## 2017-11-01 DIAGNOSIS — E860 Dehydration: Secondary | ICD-10-CM

## 2017-11-01 DIAGNOSIS — I129 Hypertensive chronic kidney disease with stage 1 through stage 4 chronic kidney disease, or unspecified chronic kidney disease: Secondary | ICD-10-CM | POA: Diagnosis not present

## 2017-11-01 DIAGNOSIS — E1165 Type 2 diabetes mellitus with hyperglycemia: Secondary | ICD-10-CM | POA: Insufficient documentation

## 2017-11-01 DIAGNOSIS — Z79899 Other long term (current) drug therapy: Secondary | ICD-10-CM | POA: Insufficient documentation

## 2017-11-01 DIAGNOSIS — Z8673 Personal history of transient ischemic attack (TIA), and cerebral infarction without residual deficits: Secondary | ICD-10-CM | POA: Diagnosis not present

## 2017-11-01 LAB — URINALYSIS, ROUTINE W REFLEX MICROSCOPIC
Bilirubin Urine: NEGATIVE
Ketones, ur: NEGATIVE mg/dL
LEUKOCYTES UA: NEGATIVE
Nitrite: NEGATIVE
PH: 5 (ref 5.0–8.0)
Protein, ur: 100 mg/dL — AB
SPECIFIC GRAVITY, URINE: 1.014 (ref 1.005–1.030)

## 2017-11-01 LAB — BASIC METABOLIC PANEL
Anion gap: 6 (ref 5–15)
BUN: 35 mg/dL — AB (ref 6–20)
CALCIUM: 8.8 mg/dL — AB (ref 8.9–10.3)
CO2: 24 mmol/L (ref 22–32)
Chloride: 108 mmol/L (ref 101–111)
Creatinine, Ser: 2.55 mg/dL — ABNORMAL HIGH (ref 0.44–1.00)
GFR calc Af Amer: 25 mL/min — ABNORMAL LOW (ref >60)
GFR calc non Af Amer: 22 mL/min — ABNORMAL LOW (ref >60)
GLUCOSE: 329 mg/dL — AB (ref 65–99)
POTASSIUM: 3.8 mmol/L (ref 3.5–5.1)
Sodium: 138 mmol/L (ref 135–145)

## 2017-11-01 LAB — CUP PACEART REMOTE DEVICE CHECK
Date Time Interrogation Session: 20190520173639
Implantable Pulse Generator Implant Date: 20190318

## 2017-11-01 LAB — CBC
HEMATOCRIT: 30.8 % — AB (ref 36.0–46.0)
HEMOGLOBIN: 10 g/dL — AB (ref 12.0–15.0)
MCH: 28.1 pg (ref 26.0–34.0)
MCHC: 32.5 g/dL (ref 30.0–36.0)
MCV: 86.5 fL (ref 78.0–100.0)
Platelets: 328 10*3/uL (ref 150–400)
RBC: 3.56 MIL/uL — ABNORMAL LOW (ref 3.87–5.11)
RDW: 13.8 % (ref 11.5–15.5)
WBC: 8.3 10*3/uL (ref 4.0–10.5)

## 2017-11-01 LAB — I-STAT BETA HCG BLOOD, ED (MC, WL, AP ONLY): I-stat hCG, quantitative: <5 m[IU]/mL (ref <5)

## 2017-11-01 LAB — CBG MONITORING, ED: Glucose-Capillary: 318 mg/dL — ABNORMAL HIGH (ref 65–99)

## 2017-11-01 MED ORDER — LORAZEPAM 2 MG/ML IJ SOLN: 1.0000 mg | Freq: Once | INTRAMUSCULAR | Status: DC

## 2017-11-01 MED ORDER — IPRATROPIUM BROMIDE 0.02 % IN SOLN: 0.5000 mg | Freq: Once | RESPIRATORY_TRACT | Status: DC

## 2017-11-01 MED ORDER — MECLIZINE HCL 25 MG PO TABS
25.0000 mg | ORAL_TABLET | Freq: Once | ORAL | Status: DC
  Filled 2017-11-01: qty 1

## 2017-11-01 MED ORDER — ALBUTEROL (5 MG/ML) CONTINUOUS INHALATION SOLN: 10.0000 mg | INHALATION_SOLUTION | RESPIRATORY_TRACT | Status: DC

## 2017-11-01 MED ORDER — METHYLPREDNISOLONE SODIUM SUCC 125 MG IJ SOLR: 125.0000 mg | Freq: Once | INTRAMUSCULAR | Status: DC

## 2017-11-01 MED ORDER — SODIUM CHLORIDE 0.9 % IV BOLUS
1000.0000 mL | Freq: Once | INTRAVENOUS | Status: AC
  Administered 2017-11-01: 1000 mL via INTRAVENOUS

## 2017-11-01 MED ORDER — DIAZEPAM 5 MG PO TABS
5.0000 mg | ORAL_TABLET | Freq: Once | ORAL | Status: AC
  Administered 2017-11-01: 5 mg via ORAL
  Filled 2017-11-01: qty 1

## 2017-11-01 NOTE — Telephone Encounter (Signed)
I spoke with patient. She is severely dizzy and "swimmy headed". She can't even take her insulin, she can;t stand long enough to eat, gait is impaired.  Her pcp did not have any openings until June. She went to urgent care and they did not diagnose her with anything per patient. She gets dizzy when moving head either way. Also when she rolls in bed. When she is sitting still she is much better. The room is spinning. She can't stand up too long because she is spinning. This may be BPPV but I advised her to go to the ED because I can't rule out another stroke, she has uncontrolled diabetes and uncontrolled HTN, very non compliant, multiple other risk factors, has had strokes in the past and pontine myelinosis as well. She also reports she fell in the tub, her walking is not stable. She can't take her insulin because she is not eating. She can't stand long enough to make something to eat. I recommend emergency room, asked her to call 911.

## 2017-11-01 NOTE — ED Provider Notes (Signed)
Care assumed from [place] Robinson, [place].  Please see her full H&P.  In short,  Christina Orozco is a 47 y.o. female with a history of CVA, CKD, hypertension, insulin dependent diabetes, vertigo presents for 2 days of dizziness described as room spinning onset on Tuesday.  Patient also reported to initial provider resolved episode of blurred vision.  CT scan was negative.  Patient does take meclizine at home for intermittent dizziness which started after her last stroke.  She has been taking this for the last several days without any improvement in her dizziness.   Physical Exam  BP (!) 165/86   Pulse 81   Temp 98.1 F (36.7 C)   Resp 12   Ht 5\' 3"  (1.6 m)   Wt 94.3 kg (208 lb)   SpO2 100%   BMI 36.85 kg/m   Physical Exam  Constitutional: She appears well-developed and well-nourished. No distress.  HENT:  Head: Normocephalic.  Eyes: Conjunctivae are normal. No scleral icterus.  Neck: Normal range of motion.  Cardiovascular: Normal rate and intact distal pulses.  Pulmonary/Chest: Effort normal.  Musculoskeletal: Normal range of motion.  Neurological: She is alert.  Moves all extremities without ataxia Speech clear and goal oriented  Skin: Skin is warm and dry.  Nursing note and vitals reviewed.   Results for orders placed or performed during the hospital encounter of 26/94/85  Basic metabolic panel  Result Value Ref Range   Sodium 138 135 - 145 mmol/L   Potassium 3.8 3.5 - 5.1 mmol/L   Chloride 108 101 - 111 mmol/L   CO2 24 22 - 32 mmol/L   Glucose, Bld 329 (H) 65 - 99 mg/dL   BUN 35 (H) 6 - 20 mg/dL   Creatinine, Ser 2.55 (H) 0.44 - 1.00 mg/dL   Calcium 8.8 (L) 8.9 - 10.3 mg/dL   GFR calc non Af Amer 22 (L) >60 mL/min   GFR calc Af Amer 25 (L) >60 mL/min   Anion gap 6 5 - 15  CBC  Result Value Ref Range   WBC 8.3 4.0 - 10.5 K/uL   RBC 3.56 (L) 3.87 - 5.11 MIL/uL   Hemoglobin 10.0 (L) 12.0 - 15.0 g/dL   HCT 30.8 (L) 36.0 - 46.0 %   MCV 86.5 78.0 - 100.0 fL   MCH  28.1 26.0 - 34.0 pg   MCHC 32.5 30.0 - 36.0 g/dL   RDW 13.8 11.5 - 15.5 %   Platelets 328 150 - 400 K/uL  Urinalysis, Routine w reflex microscopic  Result Value Ref Range   Color, Urine YELLOW YELLOW   APPearance CLEAR CLEAR   Specific Gravity, Urine 1.014 1.005 - 1.030   pH 5.0 5.0 - 8.0   Glucose, UA >=500 (A) NEGATIVE mg/dL   Hgb urine dipstick SMALL (A) NEGATIVE   Bilirubin Urine NEGATIVE NEGATIVE   Ketones, ur NEGATIVE NEGATIVE mg/dL   Protein, ur 100 (A) NEGATIVE mg/dL   Nitrite NEGATIVE NEGATIVE   Leukocytes, UA NEGATIVE NEGATIVE   RBC / HPF 0-5 0 - 5 RBC/hpf   WBC, UA 0-5 0 - 5 WBC/hpf   Bacteria, UA RARE (A) NONE SEEN   Squamous Epithelial / LPF 0-5 0 - 5   Mucus PRESENT    Hyaline Casts, UA PRESENT   CBG monitoring, ED  Result Value Ref Range   Glucose-Capillary 318 (H) 65 - 99 mg/dL  I-Stat beta hCG blood, ED  Result Value Ref Range   I-stat hCG, quantitative <5.0 <5 mIU/mL  Comment 3           Ct Head Wo Contrast  Result Date: 11/01/2017 CLINICAL DATA:  Dizziness and headache. EXAM: CT HEAD WITHOUT CONTRAST TECHNIQUE: Contiguous axial images were obtained from the base of the skull through the vertex without intravenous contrast. COMPARISON:  None. FINDINGS: Brain: No evidence of acute infarction, hemorrhage, hydrocephalus, extra-axial collection or mass lesion/mass effect. Vascular: No hyperdense vessel or unexpected calcification. Skull: Normal. Negative for fracture or focal lesion. Sinuses/Orbits: Mild-to-moderate opacification of the sphenoid sinus compatible with chronic sphenoid sinusitis. Intact orbits and globes. Clear mastoids. Other: None. IMPRESSION: No acute intracranial abnormality. Mild to moderate sphenoid mucosal thickening compatible with chronic sphenoid sinusitis. Electronically Signed   By: Ashley Royalty M.D.   On: 11/01/2017 21:09   Mr Brain Wo Contrast  Result Date: 11/01/2017 CLINICAL DATA:  Dizziness for 4 days, headache and blurry vision.  History of stroke with similar symptoms. History of hypertension, diabetes. EXAM: MRI HEAD WITHOUT CONTRAST TECHNIQUE: Multiplanar, multiecho pulse sequences of the brain and surrounding structures were obtained without intravenous contrast. COMPARISON:  MRI of the head July 25, 2017 FINDINGS: INTRACRANIAL CONTENTS: No reduced diffusion to suggest acute ischemia. Scattered chronic micro hemorrhages predominately in central distribution seen with chronic hypertension. The ventricles and sulci are normal for patient's age. Patchy supratentorial pontine white matter FLAIR T2 hyperintensities. Old LEFT basal ganglia and RIGHT thalamus lacunar infarcts. No masses, mass effect. No abnormal extra-axial fluid collections. No extra-axial masses. VASCULAR: Normal major intracranial vascular flow voids present at skull base. SKULL AND UPPER CERVICAL SPINE: No abnormal sellar expansion. No suspicious calvarial bone marrow signal. Craniocervical junction maintained. Small RIGHT frontal scalp lipoma. SINUSES/ORBITS: Mild sphenoid sinus mucosal thickening. Mastoid air cells are well aerated.The included ocular globes and orbital contents are non-suspicious. OTHER: None. IMPRESSION: 1. No acute intracranial process. 2. Chronic micro hemorrhages seen with chronic hypertension. 3. Old basal ganglia and thalami infarcts. Mild to moderate chronic small vessel ischemic changes, advanced for age. Electronically Signed   By: Elon Alas M.D.   On: 11/01/2017 23:31    ED Course/Procedures   Clinical Course as of Nov 02 9  Thu Nov 01, 2017  2215 Plan: Fluids, Valium, MRI.  If patient's symptoms are improved an MRI is negative she may be discharged home with close outpatient follow-up.   [HM]  4656 MRI without acute lesion, CVA or hemorrhage.  I personally evaluated these images.    MR Brain Wo Contrast [HM]  Fri Nov 02, 2017  0000 Pt reports she is feeling better laying in bed.  She wishes for d/c home   [HM]   0003 Elevated from baseline of 1.7-1.9.  Pt was given fluid bolus.  Discussed the need for close follow-up and repeat labs in several days.  Creatinine(!): 2.55 [HM]  0004 baseline  Hemoglobin(!): 10.0 [HM]  0011 Pt hypertensive, but has not had her evening medications.  Advised patient to take these when she gets home  BP(!): 179/91 [HM]    Clinical Course User Index [HM] Pallas Wahlert, Jarrett Soho, PA-C    Procedures  MDM    Pt with significantly improved dizziness here in the ED.  She is able to walk using her cane with steady gait and only minimal dizziness.  MRI without acute pathology.  Will plan for d/c home with valium instead of meclizine.  Pt initially with elevated CBG and creatinine.  She was given fluids.  She will need close follow-up for this with PCP.  Pt  reports it was her plan to follow-up with her neurologist tomorrow after today's visit.   Dizziness  Dehydration  Hyperglycemia  Elevated serum creatinine    Agapito Games 11/02/17 0011    Carmin Muskrat, MD 11/03/17 0000

## 2017-11-01 NOTE — ED Notes (Signed)
ED Provider at bedside. 

## 2017-11-01 NOTE — ED Provider Notes (Signed)
Patient placed in Quick Look pathway, seen and evaluated   Chief Complaint: dizziness, pt worried she has had a stroke.    HPI:   Pt reports 2 days of dizziness and weakness,   ROS: no fever. No extremity weakness  Physical Exam:   Gen: No distress  Neuro: Awake and Alert  Skin: Warm    Focused Exam: from all extremities    Initiation of care has begun. The patient has been counseled on the process, plan, and necessity for staying for the completion/evaluation, and the remainder of the medical screening examination   Sidney Ace 11/01/17 1943    Carmin Muskrat, MD 11/01/17 2354

## 2017-11-01 NOTE — Telephone Encounter (Signed)
Error, see previous phone note.  

## 2017-11-01 NOTE — Telephone Encounter (Addendum)
I will give her a call thanks

## 2017-11-01 NOTE — Telephone Encounter (Signed)
Pt has called back to inform that she did go to urgent care on yesterday and they were unable to determine what is going on with her.  Pt states they advised her to follow up with her Neurologist.  Please call

## 2017-11-01 NOTE — ED Provider Notes (Signed)
[facility] EMERGENCY DEPARTMENT Provider Note   CSN: [id number] Arrival date & time: 11/01/17  1923     History   Chief Complaint Chief Complaint  Patient presents with  . Dizziness    HPI Christina Orozco is a 47 y.o. female with past medical history of CKD, stroke, hypertension, insulin-dependent type II diabetes, presenting to the ED with 2 days of dizziness.  Patient states on Tuesday she began having room spinning dizziness that is worse with any position changes.  She states it is improved she sits very still.  She reports some episodes of nausea with the dizziness.  Also reports an episode of blurred vision that has since resolved.  She states she has a history of stroke and the symptoms feel very similar, however they were worse with her prior stroke.  Reports she was evaluated by urgent care yesterday and had no answers.  States she called her neurologist today who recommended she report to the ED.  Denies facial droop, slurred speech, numbness or weakness of extremities, chest pain, headache, or other complaints.  No history of vertigo, though is prescribed meclizine for residual intermittent dizziness following her previous strokes.  States she has been taking it 3 times a day without relief of her dizziness.  Also reports she has not had any insulin for 2 days because she has been unable to stand long enough to make herself a meal.  States she did have a sandwich today, however did not give herself any insulin.  Per chart review, patient with history of lacunar infarct and central pons infarct.   The history is provided by the patient.    Past Medical History:  Diagnosis Date  . Anemia    severe  . Asthma   . CKD (chronic kidney disease)    stage IV  . DDD (degenerative disc disease), cervical   . Diabetes mellitus ([place])   . Dislocated shoulder    right  . GERD (gastroesophageal reflux disease)   . Hypertension   . Neuropathy   . Stroke [facility])      Patient Active Problem List   Diagnosis Date Noted  . Diabetes ([place]) 09/28/2017  . Anemia of chronic disease 08/09/2017  . Hyperlipidemia 07/26/2017  . 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[place]) 07/25/2017  . Anxiety as acute reaction to exceptional stress 04/23/2017  . Insomnia 10/08/2016  . Bacterial vaginitis 10/08/2016  . Neuropathy 06/07/2016  . Gait instability   . Lesion of pons   . Slurred speech   . Dizziness 04/30/2016  . Hyperglycemia 04/30/2016  . Chronic kidney disease (CKD), stage III (moderate) ([place]) 04/30/2016  . Ataxia 04/30/2016  . Chronic right shoulder pain 04/03/2016  . 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 Rhinitis, allergic 08/25/2015  . Asthma, mild intermittent 07/14/2015  . Gastroesophageal reflux disease without esophagitis 06/08/2015  . Alopecia 06/08/2015  . Hypertensive cardiovascular disease 05/05/2015  . Vitamin D deficiency 05/05/2015    Past Surgical History:  Procedure Laterality Date  . ADENOIDECTOMY    . APPENDECTOMY    . CERVICAL SPINE SURGERY  06/2012   C5-C6  . EYE SURGERY     left eye surgery   . FOOT SURGERY    . LOOP RECORDER INSERTION N/A 08/27/2017   Procedure: LOOP RECORDER INSERTION;  Surgeon: Sanda Klein, MD;  Location: [facility];  Service: Cardiovascular;  Laterality: N/A;  . SHOULDER SURGERY    . TEE WITHOUT CARDIOVERSION N/A 08/27/2017   Procedure: TRANSESOPHAGEAL ECHOCARDIOGRAM (TEE);  Surgeon: Sanda Klein, MD;  Location: [facility] ENDOSCOPY;  Service: Cardiovascular;  Laterality: N/A;  . TONSILLECTOMY       OB History   None      Home Medications    Prior to Admission medications   Medication Sig Start Date End Date Taking? Authorizing Provider  amLODipine (NORVASC) 5 MG tablet Take 1.5 tablets (7.5 mg total) by mouth daily. 08/29/17 11/27/17  Rodriguez-Guzman, Raquel, RPH  atorvastatin (LIPITOR) 40 MG tablet TAKE 1 TABLET BY MOUTH EVERY EVENING AT 6PM 10/15/17   Dorena Dew, FNP  carvedilol (COREG) 25  MG tablet Take 1 tablet (25 mg total) by mouth 2 (two) times daily with a meal. Take 25 mg twice daily Patient taking differently: Take 25 mg by mouth 2 (two) times daily with a meal.  01/09/17   Skeet Latch, MD  clopidogrel (PLAVIX) 75 MG tablet Take 1 tablet (75 mg total) by mouth daily. 07/28/17   Rai, Vernelle Emerald, MD  doxazosin (CARDURA) 4 MG tablet Take 1 tablet (4 mg total) by mouth daily. 07/19/17   Skeet Latch, MD  ferrous sulfate 325 (65 FE) MG tablet Take 1 tablet (325 mg total) by mouth daily. 08/06/17 08/06/18  Dorena Dew, FNP  glucose blood (CONTOUR NEXT TEST) test strip 1 each by Other route 2 (two) times daily. And lancets 2/day 09/10/17   Renato Shin, MD  hydrALAZINE (APRESOLINE) 50 MG tablet Take 1 tablet (50 mg total) by mouth 3 (three) times daily. 09/18/17 12/17/17  Skeet Latch, MD  hydrochlorothiazide (HYDRODIURIL) 25 MG tablet Take 25 mg by mouth 3 (three) times a week. Monday, Wednesday, Friday    [provider]  Insulin Glargine (LANTUS SOLOSTAR) 100 UNIT/ML Solostar Pen Inject 60 Units into the skin every morning. And pen needles 1/day 09/25/17   Renato Shin, MD  meclizine (ANTIVERT) 25 MG tablet TAKE 1 TABLET(25 MG) BY MOUTH EVERY 8 HOURS AS NEEDED FOR DIZZINESS 10/15/17   Dorena Dew, FNP  PROAIR HFA 108 [phone] Base) MCG/ACT inhaler INHALE 1 OR 2 PUFFS INTO THE LUNGS EVERY 6 HOURS AS NEEDED FOR WHEEZING OR SHORTNESS OF BREATH 09/24/17   Dorena Dew, FNP    Family History Family History  Problem Relation Age of Onset  . Vascular Disease Mother   . CAD Mother   . Heart failure Mother   . Heart disease Unknown   . Cancer Unknown        colon, stomach, lung  . Diabetes Unknown   . Seizures Unknown   . Breast cancer Sister 39    Social History Social History   Tobacco Use  . Smoking status: Never Smoker  . Smokeless tobacco: Never Used  Substance Use Topics  . Alcohol use: No  . Drug use: No     Allergies   Lisinopril;  Penicillins; and Gabapentin   Review of Systems Review of Systems  Constitutional: Negative for fever.  Eyes: Positive for visual disturbance. Negative for photophobia.  Cardiovascular: Negative for chest pain.  Gastrointestinal: Positive for nausea. Negative for vomiting.  Musculoskeletal: Negative for back pain and neck pain.  Neurological: Positive for dizziness. Negative for syncope, facial asymmetry, speech difficulty, weakness and headaches.  Hematological: Bruises/bleeds easily (plavix).  Psychiatric/Behavioral: Negative for confusion.  All other systems reviewed and are negative.    Physical Exam Updated Vital Signs BP (!) 159/86 (BP Location: Right Arm)   Pulse 93   Temp 98.7 F (37.1 C) (Oral)   Resp 18  Ht 5\' 3"  (1.6 m)   Wt 94.3 kg (208 lb)   SpO2 98%   BMI 36.85 kg/m   Physical Exam  Constitutional: She is oriented to person, place, and time. She appears well-developed and well-nourished. No distress.  HENT:  Head: Normocephalic and atraumatic.  Eyes: Conjunctivae are normal.  Neck: Normal range of motion. Neck supple.  Cardiovascular: Normal rate, regular rhythm and intact distal pulses.  Pulmonary/Chest: Effort normal and breath sounds normal. No respiratory distress.  Abdominal: Soft. Bowel sounds are normal. She exhibits no distension. There is no tenderness.  Neurological: She is alert and oriented to person, place, and time.  Mental Status:  Alert, oriented, thought content appropriate, able to give a coherent history. Speech fluent without evidence of aphasia. Able to follow 2 step commands without difficulty.  Cranial Nerves:  II:  Peripheral visual fields grossly normal, pupils equal, round, reactive to light III,IV, VI: ptosis not present, extra-ocular motions intact bilaterally, 1-2 beat horizontal nystagmus with rightward gaze V,VII: smile symmetric, facial light touch sensation equal VIII: hearing grossly normal to voice  X: uvula elevates  symmetrically  XI: bilateral shoulder shrug symmetric and strong XII: midline tongue extension without fassiculations Motor:  Normal tone. 5/5 in upper and lower extremities bilaterally including strong and equal grip strength and dorsiflexion/plantar flexion Sensory: Pinprick and light touch normal in all extremities.  Deep Tendon Reflexes: 2+ and symmetric in the biceps and patella Cerebellar: normal finger-to-nose with bilateral upper extremities, neg rhomberg CV: distal pulses palpable throughout   Skin: Skin is warm.  Psychiatric: She has a normal mood and affect. Her behavior is normal.  Nursing note and vitals reviewed.    ED Treatments / Results  Labs (all labs ordered are listed, but only abnormal results are displayed) Labs Reviewed  BASIC METABOLIC PANEL - Abnormal; Notable for the following components:      Result Value   Glucose, Bld 329 (*)    BUN 35 (*)    Creatinine, Ser 2.55 (*)    Calcium 8.8 (*)    GFR calc non Af Amer 22 (*)    GFR calc Af Amer 25 (*)    All other components within normal limits  CBC - Abnormal; Notable for the following components:   RBC 3.56 (*)    Hemoglobin 10.0 (*)    HCT 30.8 (*)    All other components within normal limits  CBG MONITORING, ED - Abnormal; Notable for the following components:   Glucose-Capillary 318 (*)    All other components within normal limits  URINALYSIS, ROUTINE W REFLEX MICROSCOPIC  I-STAT BETA HCG BLOOD, ED ([facility], [facility], [facility] ONLY)    EKG EKG Interpretation  Date/Time:  Thursday Nov 01 2017 19:30:32 EDT Ventricular Rate:  91 PR Interval:  146 QRS Duration: 68 QT Interval:  388 QTC Calculation: 477 R Axis:   86 Text Interpretation:  Normal sinus rhythm Low voltage QRS Cannot rule out Anterior infarct , age undetermined Abnormal ECG No significant change since last tracing Confirmed by Wandra Arthurs [phone]) on 11/01/2017 9:09:15 PM   Radiology Ct Head Wo Contrast  Result Date: 11/01/2017 CLINICAL DATA:   Dizziness and headache. EXAM: CT HEAD WITHOUT CONTRAST TECHNIQUE: Contiguous axial images were obtained from the base of the skull through the vertex without intravenous contrast. COMPARISON:  None. FINDINGS: Brain: No evidence of acute infarction, hemorrhage, hydrocephalus, extra-axial collection or mass lesion/mass effect. Vascular: No hyperdense vessel or unexpected calcification. Skull: Normal. Negative for fracture or  focal lesion. Sinuses/Orbits: Mild-to-moderate opacification of the sphenoid sinus compatible with chronic sphenoid sinusitis. Intact orbits and globes. Clear mastoids. Other: None. IMPRESSION: No acute intracranial abnormality. Mild to moderate sphenoid mucosal thickening compatible with chronic sphenoid sinusitis. Electronically Signed   By: Ashley Royalty M.D.   On: 11/01/2017 21:09    Procedures Procedures (including critical care time)  Medications Ordered in ED Medications  sodium chloride 0.9 % bolus 1,000 mL (has no administration in time range)  meclizine (ANTIVERT) tablet 25 mg (has no administration in time range)     Initial Impression / Assessment and Plan / ED Course  I have reviewed the triage vital signs and the nursing notes.  Pertinent labs & imaging results that were available during my care of the patient were reviewed by me and considered in my medical decision making (see chart for details).     Pt presenting to the ED with complaint of room-spinning dizziness since Tuesday.  History of previous strokes, which patient states symptoms feel similar however not as severe.  On exam, no focal neuro deficits.  1-2 beat horizontal nystagmus with rightward gaze.  Unable to assess gait or balance secondary to patient's dizziness.  CT head done in triage is negative for acute pathology.  BMP with hyperglycemia 329 and mild AKI w creatinine of 2.55, up from 1.9.  CBC with hemoglobin of 10 though unchanged if not improved from baseline.  UA pending.  IV fluids and Valium  ordered.  MRI brain to rule out acute stroke.  Handoff to PA Muthersbaugh at shift change, pending MRI results and reevaluation.  Plan to reevaluate if MRI is negative for improvement in symptoms.  If MRI negative and symptoms improved, patient likely safe for discharge with follow-up with her neurologist.  Final Clinical Impressions(s) / ED Diagnoses   Final diagnoses:  None    ED Discharge Orders    None       Destine Ambroise, [place] N, PA-C 11/01/17 2210    Carmin Muskrat, MD 11/06/17 2204

## 2017-11-01 NOTE — ED Triage Notes (Signed)
Pt reports dizziness since Tuesday with HA, blurred vision. Pt states she has hx of stroke and this is how she presented last time. Pt denies numbness/tingling, weakness.

## 2017-11-01 NOTE — ED Notes (Signed)
Patient transported to MRI 

## 2017-11-02 ENCOUNTER — Ambulatory Visit: Payer: Medicaid Other | Admitting: Nurse Practitioner

## 2017-11-02 ENCOUNTER — Telehealth: Payer: Self-pay | Admitting: Neurology

## 2017-11-02 MED ORDER — DIAZEPAM 5 MG PO TABS: 5.0000 mg | ORAL_TABLET | Freq: Four times a day (QID) | ORAL | 0 refills | Status: DC | PRN

## 2017-11-02 NOTE — ED Notes (Signed)
AMBULATED TO NURSING DESK AND BACK TO ROOM. Pt used cane and was able to walk with minimal assistance.

## 2017-11-02 NOTE — Telephone Encounter (Signed)
Spoke with patient. Scheduled her for next Wednesday 11/07/17 @ 1:15 pm arrival time 12:45. This appt is with Hoyle Sauer NP. Pt verbalized understanding. She is aware that she will receive a reminder when it gets closer to her appt.

## 2017-11-02 NOTE — Discharge Instructions (Addendum)
1. Medications: STOP Meclizine, Start Valium as needed for dizziness, usual home medications 2. Treatment: rest, drink plenty of fluids,  3. Follow Up: Please followup with your primary doctor in 3 days for discussion of your diagnoses and further evaluation after today's visit; Please also call your neurologist for further discussion of today's visit.  Please return to the ER for worsening symptoms, passing out, vomiting, fevers, vision changes, chest pain or other concerns

## 2017-11-02 NOTE — Telephone Encounter (Signed)
Schedule patient follow up with [name] first available. If Christina Orozco is not available in the next month please schedule her with [name]. thanks

## 2017-11-06 NOTE — Progress Notes (Signed)
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  She was seen in the emergency room 5/23 for dizziness for 2 days. Marland Kitchen  MRI of the brain without any acute lesion CVA or hemorrhage.  Patient was also found to be dehydrated and given IV fluids.  Creatinine elevated at 2.55.  She has poorly controlled diabetic and has not taken her insulin.  She also had elevated blood pressure 165/86.  She also reported some blurred vision that cleared.  She has been taken meclizine for dizziness but was given Valium in the emergency room 10 tablets.  She has used all of those.  Her dizziness has improved and she is planning to return to work later  Today.  She returns for reevaluation  Interval history: 09/12/17 AAShe woke up dizzy, acutely in the morning, thought it was her blood pressure.  No other symptoms.  Nothing made it better or made it worse.  Her head felt funny.  It was continuous.  No weakness or other associated symptoms.  No inciting events that she knows of.  Called her heart doctor and primary doctor, told her to go to the ED where a stroke was diagnosed. 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 Stop ASA and continue Plavix. She continues to have weakness in the left hand. She has tingling in the left digits 1-3. She is compliant with medications currently per patient but has not been in the past. Repeat emg/ncs as she had carpal tunnel likely ongoing contributing to left hand systems. 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  She would like to go back to work. Interval history July 03, 2017: Noncompliant, she has not been taking her medications, she ran out and stopped taking insulin and blood pressure medicine. Patient is  here with focal neurologic symptoms.  Repeat MRI of the brain did not show anything acute.  Patient blood pressure has been upwards of 409 systolic and she continues to have uncontrolled blood pressure and uncontrolled diabetes discussed with patient this is the likely cause of her symptoms and patient has to manage these multiple medical issues or she will likely have a stroke or other seuelae.  Reviewed compliance, discussed pathophysiology of strokes and the role of her uncontrolled vascular risk factors, she already has extensive white matter disease in the brain.    When she woke up Friday morning she didn;t feel well, the right side of her face felt numb, in the ED she was noted to be "eating a box of chicken, drinking soda in NAD". That day her vision was impaired. She went to the ED and her BP was 811/914 systolic and she left the ED. MRI did not show anything acute. She went to the eye doctor. Eyes jumping and twitching. Last HgbA1c 10.8.   On testing of the eyes patient had torsional nystagmus, none today in the office, corrected visual acuity was right 20/25 and left 20/400(was 20/150).  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  Interval History 10/24/2016: Walking is better, sensory symptoms are better. Trying to control her DM and blood pressure. Has a lot of stress in her life. She is having eye complications due to her diabetes and HTN.  JGO:[id number] Christina Orozco a 47 y.o.femalehere as a referral from Dr. Dayton Martes gait abnormality. PMHx uncontrolled HTN, uncontrolled diabetes. 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 MRA was performed which showed no evidence for high grade stenosis being involved in her condition. She ws drinking a lot of sodas, she was extremely thirsty, the next day she  went to church and she was slurring and couldn't walk. She is feeling better. She is having problems with walking and with vision. Mostly up close vision. Right arm pain resolved with "shot in the arm" in the shoulder, she went to orthopaedics and was treated for right shoulder. No dizziness. Some confusion but overall doing really well. Still some imbalance.   MRI brain 04/30/2016:  Interval history 01/26/2016: Patient here for follow up. She had right arm pain with 2 negative EMG/NCS no etiology found except bilateral CTS. MRI of the brain showed abnormal white matter disease likely due to her uncontrolled vascular risk factors but was periventricular and concerning for demyelination, LP for other causes such as MS negative but need repeat MRI to follow especially given continued symptoms. She has uncontrolled HTN today in the office 559 systolic, uncontrolled [id number] 12-14 and eating [employer] in the clinic exam room. She has pain in the elbow, pain in the right shoulder, she still has weakness sin the right arm. She has headaches and vision changes. Vision worsening in the right eye. She got new glasses and she still can't see. She has double vision and blurry vision.   HOZ:[id number] Christina Orozco a 47 y.o.femalehere as a referral from Dr. Dayton Martes right hand numbness and paresthesias. PMHx HTN, DM, asthma, neuropathy, cervical degenerative disease s/p acdf.No falls.Numbnees and tingling in the right arm started at 6am yesterday morning and it hasn't stopped. She has numbness in the entire right arm all the way up toe shoulder. She woke up with the pain, the whole arm. At first she thought it was coming from the shoulder, now not sure. She does have shoulder problems, has been hurting for years and she has had surgery on it. She is pulling and yanking on it at work, heat on the shoulder didn't help. She is having weakness in the hand. She is not using the hand to drive. She has significant pain  10/10. Numb and tinlging like when her feet fall asleep. She gets sharp radiating pain in the wrist and shoot upwards. Weakness in the grip. 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 Never had any numbness or tingling in the hand in the past. It is the whole hand even the palm No numbness or weakness of legs.No headache, slurred speech, She has no history of TIA or stroke. No head injuries or falls. No anti-coagulant use.     REVIEW OF SYSTEMS: Full 14 system review of systems performed and notable only for those listed, all others are neg:  Constitutional: neg  Cardiovascular: neg Ear/Nose/Throat: neg  Skin: neg Eyes: neg Respiratory: neg Gastroitestinal: neg  Hematology/Lymphatic: Anemia Endocrine: neg Musculoskeletal: Back pain neck pain Allergy/Immunology: neg Neurological: Dizziness Psychiatric: neg Sleep : Snoring, sleep study in the past is been negative for obstructive sleep apnea   ALLERGIES: Allergies  Allergen Reactions  . Lisinopril Swelling and Other (See Comments)    Facial swelling   . Penicillins Hives  Has patient had a PCN reaction causing immediate rash, facial/tongue/throat swelling, SOB or lightheadedness with hypotension: yes Has patient had a PCN reaction causing severe rash involving mucus membranes or skin necrosis: No Has patient had a PCN reaction that required hospitalization No Has patient had a PCN reaction occurring within the last 10 years: No If all of the above answers are "NO", then may proceed wit  . Gabapentin Swelling and Other (See Comments)    "I thought I was having another stroke."     HOME MEDICATIONS: Outpatient Medications Prior to Visit  Medication Sig Dispense Refill  . amLODipine (NORVASC) 5 MG tablet Take 1.5 tablets (7.5 mg total) by mouth daily. 45 tablet 5  . atorvastatin (LIPITOR) 40 MG tablet TAKE 1 TABLET BY MOUTH EVERY EVENING AT 6PM 30 tablet 0    . carvedilol (COREG) 25 MG tablet Take 1 tablet (25 mg total) by mouth 2 (two) times daily with a meal. Take 25 mg twice daily (Patient taking differently: Take 25 mg by mouth 2 (two) times daily with a meal. ) 180 tablet 3  . clopidogrel (PLAVIX) 75 MG tablet Take 1 tablet (75 mg total) by mouth daily. 30 tablet 4  . doxazosin (CARDURA) 4 MG tablet Take 1 tablet (4 mg total) by mouth daily. 90 tablet 1  . ferrous sulfate 325 (65 FE) MG tablet Take 1 tablet (325 mg total) by mouth daily. 30 tablet 3  . glucose blood (CONTOUR NEXT TEST) test strip 1 each by Other route 2 (two) times daily. And lancets 2/day 100 each 12  . hydrALAZINE (APRESOLINE) 50 MG tablet Take 1 tablet (50 mg total) by mouth 3 (three) times daily. 90 tablet 5  . hydrochlorothiazide (HYDRODIURIL) 25 MG tablet Take 25 mg by mouth 3 (three) times a week. Monday, Wednesday, Friday    . Insulin Glargine (LANTUS SOLOSTAR) 100 UNIT/ML Solostar Pen Inject 60 Units into the skin every morning. And pen needles 1/day 10 pen PRN  . meclizine (ANTIVERT) 25 MG tablet TAKE 1 TABLET(25 MG) BY MOUTH EVERY 8 HOURS AS NEEDED FOR DIZZINESS 90 tablet 0  . PROAIR HFA 108 (90 Base) MCG/ACT inhaler INHALE 1 OR 2 PUFFS INTO THE LUNGS EVERY 6 HOURS AS NEEDED FOR WHEEZING OR SHORTNESS OF BREATH 8.5 g 0  . diazepam (VALIUM) 5 MG tablet Take 1 tablet (5 mg total) by mouth every 6 (six) hours as needed (dizziness). (Patient not taking: Reported on 11/07/2017) 10 tablet 0   No facility-administered medications prior to visit.     PAST MEDICAL HISTORY: Past Medical History:  Diagnosis Date  . Anemia    severe  . Asthma   . CKD (chronic kidney disease)    stage IV  . DDD (degenerative disc disease), cervical   . Diabetes mellitus ([place])   . Dislocated shoulder    right  . GERD (gastroesophageal reflux disease)   . Hypertension   . Neuropathy   . Stroke [facility])     PAST SURGICAL HISTORY: Past Surgical History:  Procedure Laterality Date  .  ADENOIDECTOMY    . APPENDECTOMY    . CERVICAL SPINE SURGERY  06/2012   C5-C6  . EYE SURGERY     left eye surgery   . FOOT SURGERY    . LOOP RECORDER INSERTION N/A 08/27/2017   Procedure: LOOP RECORDER INSERTION;  Surgeon: Sanda Klein, MD;  Location: [facility];  Service: Cardiovascular;  Laterality: N/A;  . SHOULDER SURGERY    .  TEE WITHOUT CARDIOVERSION N/A 08/27/2017   Procedure: TRANSESOPHAGEAL ECHOCARDIOGRAM (TEE);  Surgeon: Sanda Klein, MD;  Location: [facility] ENDOSCOPY;  Service: Cardiovascular;  Laterality: N/A;  . TONSILLECTOMY      FAMILY HISTORY: Family History  Problem Relation Age of Onset  . Vascular Disease Mother   . CAD Mother   . Heart failure Mother   . Heart disease Unknown   . Cancer Unknown        colon, stomach, lung  . Diabetes Unknown   . Seizures Unknown   . Breast cancer Sister 19    SOCIAL HISTORY: Social History   Socioeconomic History  . Marital status: Single    Spouse name: Not on file  . Number of children: 3  . Years of education: 12+  . Highest education level: Not on file  Occupational History  . Not on file  Social Needs  . Financial resource strain: Not on file  . Food insecurity:    Worry: Not on file    Inability: Not on file  . Transportation needs:    Medical: Not on file    Non-medical: Not on file  Tobacco Use  . Smoking status: Never Smoker  . Smokeless tobacco: Never Used  Substance and Sexual Activity  . Alcohol use: No  . Drug use: No  . Sexual activity: Not on file  Lifestyle  . Physical activity:    Days per week: Not on file    Minutes per session: Not on file  . Stress: Not on file  Relationships  . Social connections:    Talks on phone: Not on file    Gets together: Not on file    Attends religious service: Not on file    Active member of club or organization: Not on file    Attends meetings of clubs or organizations: Not on file    Relationship status: Not on file  . Intimate partner  violence:    Fear of current or ex partner: Not on file    Emotionally abused: Not on file    Physically abused: Not on file    Forced sexual activity: Not on file  Other Topics Concern  . Not on file  Social History Narrative   Lives with daughter   Caffeine use: 3 cups daily   Right handed     PHYSICAL EXAM  Vitals:   11/07/17 1319  BP: 135/86  Pulse: 80  Weight: 209 lb 9.6 oz (95.1 kg)  Height: 5' 2.5" (1.588 m)   Body mass index is 37.73 kg/m.  Generalized: Well developed, obese female in no acute distress  Head: normocephalic and atraumatic,. Oropharynx benign  Neck: Supple, no carotid bruits  Cardiac: Regular rate rhythm, no murmur  Musculoskeletal: No deformity   Neurological examination   Mentation: Alert oriented to time, place, history taking. Attention span and concentration appropriate. Recent and remote memory intact.  Follows all commands speech and language fluent.   Cranial nerve II-XII: .Pupils were equal round reactive to light extraocular movements were full, visual field were full on confrontational test. Facial sensation and strength were normal. hearing was intact to finger rubbing bilaterally. Uvula tongue midline. head turning and shoulder shrug were normal and symmetric.Tongue protrusion into cheek strength was normal. Motor: normal bulk and tone, full strength in the BUE, BLE, fine finger movements normal, no pronator drift. No focal weakness Sensory: normal and symmetric to light touch, pinprick, and  Vibration, in the upper and lower extremities Coordination: finger-nose-finger, heel-to-shin  bilaterally, no dysmetria Reflexes: 1+ upper lower and symmetric plantar responses were flexor bilaterally. Gait and Station: Rising up from seated position without assistance, normal stance,  moderate stride, good arm swing, smooth turning, able to perform tiptoe, and heel walking without difficulty. Tandem gait is unsteady  DIAGNOSTIC DATA (LABS, IMAGING,  TESTING) - I reviewed patient records, labs, notes, testing and imaging myself where available.  Lab Results  Component Value Date   WBC 8.3 11/01/2017   HGB 10.0 (L) 11/01/2017   HCT 30.8 (L) 11/01/2017   MCV 86.5 11/01/2017   PLT 328 11/01/2017      Component Value Date/Time   NA 138 11/01/2017 1954   NA 141 08/20/2017 1503   K 3.8 11/01/2017 1954   CL 108 11/01/2017 1954   CO2 24 11/01/2017 1954   GLUCOSE 329 (H) 11/01/2017 1954   BUN 35 (H) 11/01/2017 1954   BUN 35 (H) 08/20/2017 1503   CREATININE 2.55 (H) 11/01/2017 1954   CREATININE 1.73 (H) 04/23/2017 0943   CALCIUM 8.8 (L) 11/01/2017 1954   PROT 6.5 07/26/2017 1118   PROT 7.1 07/05/2015 1033   ALBUMIN 3.0 (L) 07/26/2017 1118   ALBUMIN 4.2 07/05/2015 1033   AST 16 07/26/2017 1118   ALT 11 (L) 07/26/2017 1118   ALKPHOS 59 07/26/2017 1118   BILITOT 0.5 07/26/2017 1118   BILITOT 0.3 07/05/2015 1033   GFRNONAA 22 (L) 11/01/2017 1954   GFRNONAA 36 (L) 04/23/2017 0943   GFRAA 25 (L) 11/01/2017 1954   GFRAA 41 (L) 04/23/2017 0943   Lab Results  Component Value Date   CHOL 99 07/26/2017   HDL 42 07/26/2017   LDLCALC 43 07/26/2017   TRIG 70 07/26/2017   CHOLHDL 2.4 07/26/2017   Lab Results  Component Value Date   HGBA1C 12.5 (H) 07/26/2017   Lab Results  Component Value Date   BLTJQZES92 330 08/03/2017   Lab Results  Component Value Date   TSH 1.883 07/27/2017      ASSESSMENT AND PLAN  47 y.o. female here for follow up. PMHx Mecdical noncompliance, uncontrolled HTN, uncontrolled DM, asthma, neuropathy, cervical degenerative disease s/p acdf,   -Patient recently admitted at [facility] for stroke, small vessel, likely due to chronic microvascular changes secondary to her uncontrolled diabetes, uncontrolled hypertension and medical noncompliance.  Seen in the emergency room on 11/02/2017 for dizziness, dehydration hypertension.  MRI brain without acute findings.  Patient had been noncompliant with her  medications. Discussed again how important compliance is and that medical problems are direct result of her uncontrolled vascular risk factors.    PLAN: Given information on dizziness Continue Plavix for secondary stroke prevention lacunar stroke due to uncontrolled vascular risk factors:  Continue Lipitor for cholesterol B/P in the office today 135/86 MRI of the brain negative for stroke on 11/02/17 F/U as needed Dennie Bible, [facility], [facility], [facility] [address], [place], [place] [address] [phone]

## 2017-11-07 ENCOUNTER — Ambulatory Visit (INDEPENDENT_AMBULATORY_CARE_PROVIDER_SITE_OTHER): Payer: BLUE CROSS/BLUE SHIELD | Admitting: Nurse Practitioner

## 2017-11-07 ENCOUNTER — Encounter: Payer: Self-pay | Admitting: Nurse Practitioner

## 2017-11-07 VITALS — BP 135/86 | HR 80 | Ht 62.5 in | Wt 209.6 lb

## 2017-11-07 DIAGNOSIS — E1122 Type 2 diabetes mellitus with diabetic chronic kidney disease: Secondary | ICD-10-CM

## 2017-11-07 DIAGNOSIS — N183 Chronic kidney disease, stage 3 unspecified: Secondary | ICD-10-CM

## 2017-11-07 DIAGNOSIS — I6381 Other cerebral infarction due to occlusion or stenosis of small artery: Secondary | ICD-10-CM

## 2017-11-07 DIAGNOSIS — Z794 Long term (current) use of insulin: Secondary | ICD-10-CM | POA: Diagnosis not present

## 2017-11-07 DIAGNOSIS — E785 Hyperlipidemia, unspecified: Secondary | ICD-10-CM | POA: Diagnosis not present

## 2017-11-07 DIAGNOSIS — R2681 Unsteadiness on feet: Secondary | ICD-10-CM

## 2017-11-07 DIAGNOSIS — R42 Dizziness and giddiness: Secondary | ICD-10-CM

## 2017-11-07 NOTE — Progress Notes (Signed)
Personally  participated in, made any corrections needed, and agree with history, physical, neuro exam,assessment and plan as stated above.    Viktoriya Glaspy, MD [facility] 

## 2017-11-07 NOTE — Patient Instructions (Addendum)
Given information on dizziness Continue Plavix for secondary stroke prevention lacunar stroke due to uncontrolled vascular risk factors:  Continue Lipitor for cholesterol B/P in the office today 135/86 MRI of the brain negative for stroke F/U as needed Dizziness Dizziness is a common problem. It makes you feel unsteady or light-headed. You may feel like you are about to pass out (faint). Dizziness can lead to getting hurt if you stumble or fall. Dizziness can be caused by many things, including:  Medicines.  Not having enough water in your body (dehydration).  Illness.  Follow these instructions at home: Eating and drinking  Drink enough fluid to keep your pee (urine) clear or pale yellow. This helps to keep you from getting dehydrated. Try to drink more clear fluids, such as water.  Do not drink alcohol.  Limit how much caffeine you drink or eat, if your doctor tells you to do that.  Limit how much salt (sodium) you drink or eat, if your doctor tells you to do that. Activity  Avoid making quick movements. ? When you stand up from sitting in a chair, steady yourself until you feel okay. ? In the morning, first sit up on the side of the bed. When you feel okay, stand slowly while you hold onto something. Do this until you know that your balance is fine.  If you need to stand in one place for a long time, move your legs often. Tighten and relax the muscles in your legs while you are standing.  Do not drive or use heavy machinery if you feel dizzy.  Avoid bending down if you feel dizzy. Place items in your home so you can reach them easily without leaning over. Lifestyle  Do not use any products that contain nicotine or tobacco, such as cigarettes and e-cigarettes. If you need help quitting, ask your doctor.  Try to lower your stress level. You can do this by using methods such as yoga or meditation. Talk with your doctor if you need help. General instructions  Watch your  dizziness for any changes.  Take over-the-counter and prescription medicines only as told by your doctor. Talk with your doctor if you think that you are dizzy because of a medicine that you are taking.  Tell a friend or a family member that you are feeling dizzy. If he or she notices any changes in your behavior, have this person call your doctor.  Keep all follow-up visits as told by your doctor. This is important. Contact a doctor if:  Your dizziness does not go away.  Your dizziness or light-headedness gets worse.  You feel sick to your stomach (nauseous).  You have trouble hearing.  You have new symptoms.  You are unsteady on your feet.  You feel like the room is spinning. Get help right away if:  You throw up (vomit) or have watery poop (diarrhea), and you cannot eat or drink anything.  You have trouble: ? Talking. ? Walking. ? Swallowing. ? Using your arms, hands, or legs.  You feel generally weak.  You are not thinking clearly, or you have trouble forming sentences. A friend or family member may notice this.  You have: ? Chest pain. ? Pain in your belly (abdomen). ? Shortness of breath. ? Sweating.  Your vision changes.  You are bleeding.  You have a very bad headache.  You have neck pain or a stiff neck.  You have a fever. These symptoms may be an emergency. Do not wait to  see if the symptoms will go away. Get medical help right away. Call your local emergency services (911 in the [place] Do not drive yourself to the hospital. Summary  Dizziness makes you feel unsteady or light-headed. You may feel like you are about to pass out (faint).  Drink enough fluid to keep your pee (urine) clear or pale yellow. Do not drink alcohol.  Avoid making quick movements if you feel dizzy.  Watch your dizziness for any changes. This information is not intended to replace advice given to you by your health care provider. Make sure you discuss any questions you have  with your health care provider. Document Released: 05/18/2011 Document Revised: 06/15/2016 Document Reviewed: 06/15/2016 Elsevier Interactive Patient Education  2017 [employer].

## 2017-11-09 ENCOUNTER — Encounter: Payer: Self-pay | Admitting: Endocrinology

## 2017-11-09 ENCOUNTER — Ambulatory Visit (INDEPENDENT_AMBULATORY_CARE_PROVIDER_SITE_OTHER): Payer: BLUE CROSS/BLUE SHIELD | Admitting: Endocrinology

## 2017-11-09 VITALS — BP 150/80 | HR 86 | Ht 62.5 in | Wt 213.0 lb

## 2017-11-09 DIAGNOSIS — N183 Chronic kidney disease, stage 3 (moderate): Secondary | ICD-10-CM | POA: Diagnosis not present

## 2017-11-09 DIAGNOSIS — E1122 Type 2 diabetes mellitus with diabetic chronic kidney disease: Secondary | ICD-10-CM

## 2017-11-09 DIAGNOSIS — Z9641 Presence of insulin pump (external) (internal): Secondary | ICD-10-CM

## 2017-11-09 DIAGNOSIS — Z794 Long term (current) use of insulin: Secondary | ICD-10-CM | POA: Diagnosis not present

## 2017-11-09 DIAGNOSIS — E118 Type 2 diabetes mellitus with unspecified complications: Secondary | ICD-10-CM | POA: Diagnosis not present

## 2017-11-09 LAB — POCT GLYCOSYLATED HEMOGLOBIN (HGB A1C): HEMOGLOBIN A1C: 8.3 % — AB (ref 4.0–5.6)

## 2017-11-09 NOTE — Progress Notes (Signed)
Subjective:    Patient ID: Christina Orozco, female    DOB: 07-Sep-1970, 47 y.o.   MRN: [id number]  HPI Pt returns for f/u of diabetes mellitus: DM type: Insulin-requiring type 2.   Dx'ed: 2703 Complications: polyneuropathy, PDR, renal failure, and CVA Therapy: insulin since soon after dx GDM: never DKA: never Severe hypoglycemia: never Pancreatitis: never Pancreatic imaging: normal on 2018 CT Other: she takes QD insulin, after poor results with multiple daily injections.  CBG's are reported by pt today.  Pt takes meds as rx'ed. She states cbg's vary from 90-150.  It is in general lowest in the afternoon.  She missed the insulin several days last week.   Past Medical History:  Diagnosis Date  . Anemia    severe  . Asthma   . CKD (chronic kidney disease)    stage IV  . DDD (degenerative disc disease), cervical   . Diabetes mellitus ([place])   . Dislocated shoulder    right  . GERD (gastroesophageal reflux disease)   . Hypertension   . Neuropathy   . Stroke [facility])     Past Surgical History:  Procedure Laterality Date  . ADENOIDECTOMY    . APPENDECTOMY    . CERVICAL SPINE SURGERY  06/2012   C5-C6  . EYE SURGERY     left eye surgery   . FOOT SURGERY    . LOOP RECORDER INSERTION N/A 08/27/2017   Procedure: LOOP RECORDER INSERTION;  Surgeon: Sanda Klein, MD;  Location: [facility];  Service: Cardiovascular;  Laterality: N/A;  . SHOULDER SURGERY    . TEE WITHOUT CARDIOVERSION N/A 08/27/2017   Procedure: TRANSESOPHAGEAL ECHOCARDIOGRAM (TEE);  Surgeon: Sanda Klein, MD;  Location: [facility] ENDOSCOPY;  Service: Cardiovascular;  Laterality: N/A;  . TONSILLECTOMY      Social History   Socioeconomic History  . Marital status: Single    Spouse name: Not on file  . Number of children: 3  . Years of education: 12+  . Highest education level: Not on file  Occupational History  . Not on file  Social Needs  . Financial resource strain: Not on file  . Food insecurity:      Worry: Not on file    Inability: Not on file  . Transportation needs:    Medical: Not on file    Non-medical: Not on file  Tobacco Use  . Smoking status: Never Smoker  . Smokeless tobacco: Never Used  Substance and Sexual Activity  . Alcohol use: No  . Drug use: No  . Sexual activity: Not on file  Lifestyle  . Physical activity:    Days per week: Not on file    Minutes per session: Not on file  . Stress: Not on file  Relationships  . Social connections:    Talks on phone: Not on file    Gets together: Not on file    Attends religious service: Not on file    Active member of club or organization: Not on file    Attends meetings of clubs or organizations: Not on file    Relationship status: Not on file  . Intimate partner violence:    Fear of current or ex partner: Not on file    Emotionally abused: Not on file    Physically abused: Not on file    Forced sexual activity: Not on file  Other Topics Concern  . Not on file  Social History Narrative   Lives with daughter   Caffeine use: 3  cups daily   Right handed    Current Outpatient Medications on File Prior to Visit  Medication Sig Dispense Refill  . amLODipine (NORVASC) 5 MG tablet Take 1.5 tablets (7.5 mg total) by mouth daily. 45 tablet 5  . atorvastatin (LIPITOR) 40 MG tablet TAKE 1 TABLET BY MOUTH EVERY EVENING AT 6PM 30 tablet 0  . carvedilol (COREG) 25 MG tablet Take 1 tablet (25 mg total) by mouth 2 (two) times daily with a meal. Take 25 mg twice daily (Patient taking differently: Take 25 mg by mouth 2 (two) times daily with a meal. ) 180 tablet 3  . clopidogrel (PLAVIX) 75 MG tablet Take 1 tablet (75 mg total) by mouth daily. 30 tablet 4  . doxazosin (CARDURA) 4 MG tablet Take 1 tablet (4 mg total) by mouth daily. 90 tablet 1  . ferrous sulfate 325 (65 FE) MG tablet Take 1 tablet (325 mg total) by mouth daily. 30 tablet 3  . glucose blood (CONTOUR NEXT TEST) test strip 1 each by Other route 2 (two) times daily.  And lancets 2/day 100 each 12  . hydrALAZINE (APRESOLINE) 50 MG tablet Take 1 tablet (50 mg total) by mouth 3 (three) times daily. 90 tablet 5  . hydrochlorothiazide (HYDRODIURIL) 25 MG tablet Take 25 mg by mouth 3 (three) times a week. Monday, Wednesday, Friday    . Insulin Glargine (LANTUS SOLOSTAR) 100 UNIT/ML Solostar Pen Inject 60 Units into the skin every morning. And pen needles 1/day 10 pen PRN  . meclizine (ANTIVERT) 25 MG tablet TAKE 1 TABLET(25 MG) BY MOUTH EVERY 8 HOURS AS NEEDED FOR DIZZINESS 90 tablet 0  . PROAIR HFA 108 (90 Base) MCG/ACT inhaler INHALE 1 OR 2 PUFFS INTO THE LUNGS EVERY 6 HOURS AS NEEDED FOR WHEEZING OR SHORTNESS OF BREATH 8.5 g 0   No current facility-administered medications on file prior to visit.     Allergies  Allergen Reactions  . Lisinopril Swelling and Other (See Comments)    Facial swelling   . Penicillins Hives    Has patient had a PCN reaction causing immediate rash, facial/tongue/throat swelling, SOB or lightheadedness with hypotension: yes Has patient had a PCN reaction causing severe rash involving mucus membranes or skin necrosis: No Has patient had a PCN reaction that required hospitalization No Has patient had a PCN reaction occurring within the last 10 years: No If all of the above answers are "NO", then may proceed wit  . Gabapentin Swelling and Other (See Comments)    "I thought I was having another stroke."     Family History  Problem Relation Age of Onset  . Vascular Disease Mother   . CAD Mother   . Heart failure Mother   . Heart disease Unknown   . Cancer Unknown        colon, stomach, lung  . Diabetes Unknown   . Seizures Unknown   . Breast cancer Sister 30    BP (!) 150/80 (BP Location: Left Arm, Patient Position: Sitting, Cuff Size: Normal)   Pulse 86   Ht 5' 2.5" (1.588 m)   Wt 213 lb (96.6 kg)   SpO2 98%   BMI 38.34 kg/m    Review of Systems She denies hypoglycemia.      Objective:   Physical Exam VITAL  SIGNS:  See vs page GENERAL: no distress Pulses: dorsalis pedis intact bilat.   MSK: no deformity of the feet CV: 1+ bilat leg edema Skin:  no ulcer on the  feet.  normal color and temp on the feet. Neuro: sensation is intact to touch on the feet  Lab Results  Component Value Date   CREATININE 2.55 (H) 11/01/2017   BUN 35 (H) 11/01/2017   NA 138 11/01/2017   K 3.8 11/01/2017   CL 108 11/01/2017   CO2 24 11/01/2017    Lab Results  Component Value Date   HGBA1C 8.3 (A) 11/09/2017      Assessment & Plan:  Insulin-requiring type 2 DM, with PDR: he needs increased rx Noncompliance with insulin.  We discussed need to take Renal failure: he may need a faster-acting qd insulin, but we need to see how he does taking insulin as rx'ed.    Patient Instructions  check your blood sugar twice a day.  vary the time of day when you check, between before the 3 meals, and at bedtime.  also check if you have symptoms of your blood sugar being too high or too low.  please keep a record of the readings and bring it to your next appointment here (or you can bring the meter itself).  You can write it on any piece of paper.  please call us sooner if your blood sugar goes below 70, or if you have a lot of readings over 200.   Please continue the same insulin.   Please come back for a follow-up appointment in 2 months.

## 2017-11-09 NOTE — Patient Instructions (Addendum)
check your blood sugar twice a day.  vary the time of day when you check, between before the 3 meals, and at bedtime.  also check if you have symptoms of your blood sugar being too high or too low.  please keep a record of the readings and bring it to your next appointment here (or you can bring the meter itself).  You can write it on any piece of paper.  please call us sooner if your blood sugar goes below 70, or if you have a lot of readings over 200.   Please continue the same insulin.   Please come back for a follow-up appointment in 2 months.

## 2017-11-14 ENCOUNTER — Telehealth: Payer: Self-pay | Admitting: Neurology

## 2017-11-14 ENCOUNTER — Telehealth: Payer: Self-pay | Admitting: Nurse Practitioner

## 2017-11-14 NOTE — Telephone Encounter (Signed)
Christina Orozco from [employer] has called, he stated that on 04-03 he received a letter from Dr Jaynee Eagles that pt could work FT30 hours.  He'd like to know if pt has been given a date as to when she can return to work FT 40.  He is asking to be called at [phone]

## 2017-11-14 NOTE — Telephone Encounter (Signed)
Per [employer] They have Reached out to the patient on multiple attempts patient has not returned any telephone calls to schedule an apt.  I tried to reach out to patient as well.

## 2017-11-14 NOTE — Telephone Encounter (Signed)
Dr. Jaynee Eagles says pt is noncompliant contributing to many of the problems she has. She can return to work without restrictions as stated earlier in the month. MRI of the brain in ER negative for acute findings. Pt was dehydrated contributing to her symptoms.

## 2017-11-14 NOTE — Telephone Encounter (Signed)
I spoke to pt as got a call from [employer].and she has been back to work at 30 hours.  She states that was having problems with dizziness, and seen in ED 11/01/17 and then CM/NP 11/07/17.  Did not discuss when in for appt about returning to 40 hours.  I asked pt what her expectation was and she said was still having issues and stated 01/2018.  Please advise.

## 2017-11-15 ENCOUNTER — Encounter: Payer: Self-pay | Admitting: *Deleted

## 2017-11-15 NOTE — Telephone Encounter (Signed)
I called pt and relayed that per both providers, Dr. Jaynee Eagles and [name], NP She is able to return to FT 40 hours per week starting 11-16-17.  She needs a note stating this and letter done.  I  let [employer], Mitzi Hansen know this as well (return to work FT 40 hour tomorrow).

## 2017-11-19 ENCOUNTER — Other Ambulatory Visit (HOSPITAL_COMMUNITY): Payer: Self-pay

## 2017-11-20 ENCOUNTER — Ambulatory Visit (HOSPITAL_COMMUNITY)
Admission: RE | Admit: 2017-11-20 | Discharge: 2017-11-20 | Disposition: A | Discharge status: Home / Self Care | Payer: BLUE CROSS/BLUE SHIELD | Source: Ambulatory Visit | Attending: Nephrology | Admitting: Nephrology

## 2017-11-20 VITALS — BP 142/76 | HR 73 | Temp 98.6°F | Resp 18 | Ht 62.0 in | Wt 208.0 lb

## 2017-11-20 DIAGNOSIS — N183 Chronic kidney disease, stage 3 unspecified: Secondary | ICD-10-CM

## 2017-11-20 DIAGNOSIS — D631 Anemia in chronic kidney disease: Secondary | ICD-10-CM | POA: Diagnosis not present

## 2017-11-20 LAB — POCT HEMOGLOBIN-HEMACUE: Hemoglobin: 9.6 g/dL — ABNORMAL LOW (ref 12.0–15.0)

## 2017-11-20 MED ORDER — DARBEPOETIN ALFA 100 MCG/0.5ML IJ SOSY
PREFILLED_SYRINGE | INTRAMUSCULAR | Status: AC
  Administered 2017-11-20: 100 ug via SUBCUTANEOUS
  Filled 2017-11-20: qty 0.5

## 2017-11-20 MED ORDER — SODIUM CHLORIDE 0.9 % IV SOLN
510.0000 mg | Freq: Once | INTRAVENOUS | Status: AC
  Administered 2017-11-20: 510 mg via INTRAVENOUS
  Filled 2017-11-20: qty 17

## 2017-11-20 MED ORDER — DARBEPOETIN ALFA 100 MCG/0.5ML IJ SOSY
100.0000 ug | PREFILLED_SYRINGE | INTRAMUSCULAR | Status: DC
  Administered 2017-11-20: 100 ug via SUBCUTANEOUS

## 2017-11-20 NOTE — Discharge Instructions (Signed)

## 2017-12-03 ENCOUNTER — Ambulatory Visit: Payer: BLUE CROSS/BLUE SHIELD | Admitting: Family Medicine

## 2017-12-03 ENCOUNTER — Ambulatory Visit (INDEPENDENT_AMBULATORY_CARE_PROVIDER_SITE_OTHER): Payer: BLUE CROSS/BLUE SHIELD | Admitting: *Deleted

## 2017-12-03 DIAGNOSIS — I639 Cerebral infarction, unspecified: Secondary | ICD-10-CM

## 2017-12-04 NOTE — Progress Notes (Signed)
Carelink Summary Report / Loop Recorder 

## 2017-12-06 ENCOUNTER — Ambulatory Visit: Payer: BLUE CROSS/BLUE SHIELD | Admitting: Cardiovascular Disease

## 2017-12-16 ENCOUNTER — Other Ambulatory Visit: Payer: Self-pay | Admitting: Family Medicine

## 2017-12-16 DIAGNOSIS — R42 Dizziness and giddiness: Secondary | ICD-10-CM

## 2017-12-16 DIAGNOSIS — H8190 Unspecified disorder of vestibular function, unspecified ear: Secondary | ICD-10-CM

## 2017-12-18 ENCOUNTER — Ambulatory Visit (HOSPITAL_COMMUNITY)
Admission: RE | Admit: 2017-12-18 | Discharge: 2017-12-18 | Disposition: A | Discharge status: Home / Self Care | Payer: BLUE CROSS/BLUE SHIELD | Source: Ambulatory Visit | Attending: Nephrology | Admitting: Nephrology

## 2017-12-18 VITALS — BP 153/77 | HR 79 | Temp 97.9°F | Resp 18

## 2017-12-18 DIAGNOSIS — Z5181 Encounter for therapeutic drug level monitoring: Secondary | ICD-10-CM | POA: Diagnosis not present

## 2017-12-18 DIAGNOSIS — Z79899 Other long term (current) drug therapy: Secondary | ICD-10-CM | POA: Insufficient documentation

## 2017-12-18 DIAGNOSIS — N183 Chronic kidney disease, stage 3 unspecified: Secondary | ICD-10-CM

## 2017-12-18 DIAGNOSIS — D631 Anemia in chronic kidney disease: Secondary | ICD-10-CM | POA: Diagnosis not present

## 2017-12-18 LAB — IRON AND TIBC
IRON: 48 ug/dL (ref 28–170)
Saturation Ratios: 25 % (ref 10.4–31.8)
TIBC: 193 ug/dL — AB (ref 250–450)
UIBC: 145 ug/dL

## 2017-12-18 LAB — FERRITIN: Ferritin: 201 ng/mL (ref 11–307)

## 2017-12-18 LAB — POCT HEMOGLOBIN-HEMACUE: HEMOGLOBIN: 10.6 g/dL — AB (ref 12.0–15.0)

## 2017-12-18 MED ORDER — DARBEPOETIN ALFA 100 MCG/0.5ML IJ SOSY
100.0000 ug | PREFILLED_SYRINGE | INTRAMUSCULAR | Status: DC
  Administered 2017-12-18: 100 ug via SUBCUTANEOUS

## 2017-12-18 MED ORDER — DARBEPOETIN ALFA 100 MCG/0.5ML IJ SOSY
PREFILLED_SYRINGE | INTRAMUSCULAR | Status: AC
  Administered 2017-12-18: 100 ug via SUBCUTANEOUS
  Filled 2017-12-18: qty 0.5

## 2018-01-01 ENCOUNTER — Telehealth: Payer: Self-pay | Admitting: Cardiovascular Disease

## 2018-01-01 NOTE — Telephone Encounter (Signed)
Received records from [facility] on 01/01/18, Appt 02/01/18 @ 3:30PM. NV

## 2018-01-03 ENCOUNTER — Ambulatory Visit (INDEPENDENT_AMBULATORY_CARE_PROVIDER_SITE_OTHER): Payer: BLUE CROSS/BLUE SHIELD | Admitting: *Deleted

## 2018-01-03 DIAGNOSIS — I639 Cerebral infarction, unspecified: Secondary | ICD-10-CM

## 2018-01-03 NOTE — Progress Notes (Signed)
Carelink Summary Report / Loop Recorder 

## 2018-01-08 IMAGING — XA DG FLUORO GUIDE SPINAL/SI JT INJ*R*
1 series · 2 of 2 positions shown · non-contrast
Comparison: none

CLINICAL DATA: Indeterminate white matter abnormality on MR brain.
Evaluate for multiple sclerosis.

EXAM:
LUMBAR PUNCTURE UNDER FLUOROSCOPY
FLUOROSCOPY TIME:  9 seconds corresponding to a Dose Area Product of
16.87 ?Gy*m2
TECHNIQUE: Informed written consent was obtained.  Time-out was performed.

[Series 1: ortho standard · 2 of 2 slices shown]
[im 1/2]
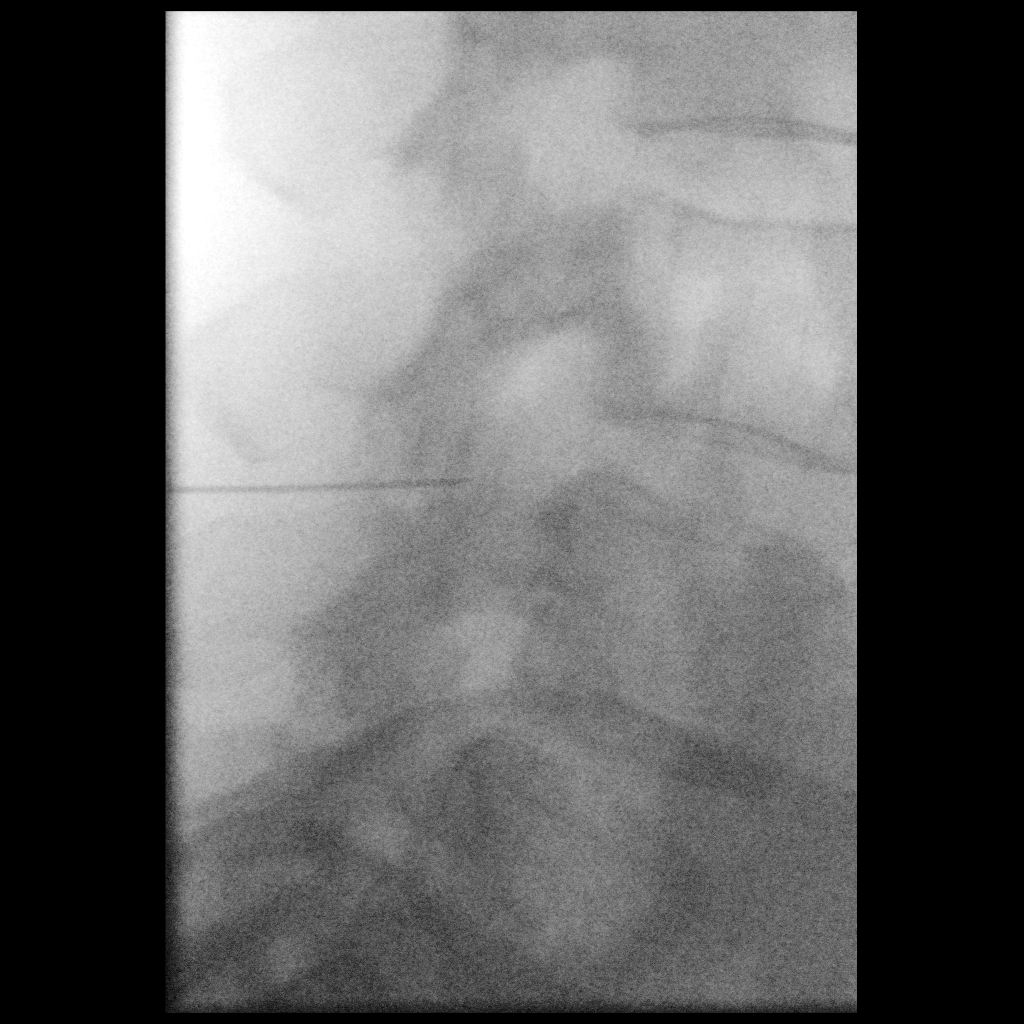
[im 2/2]
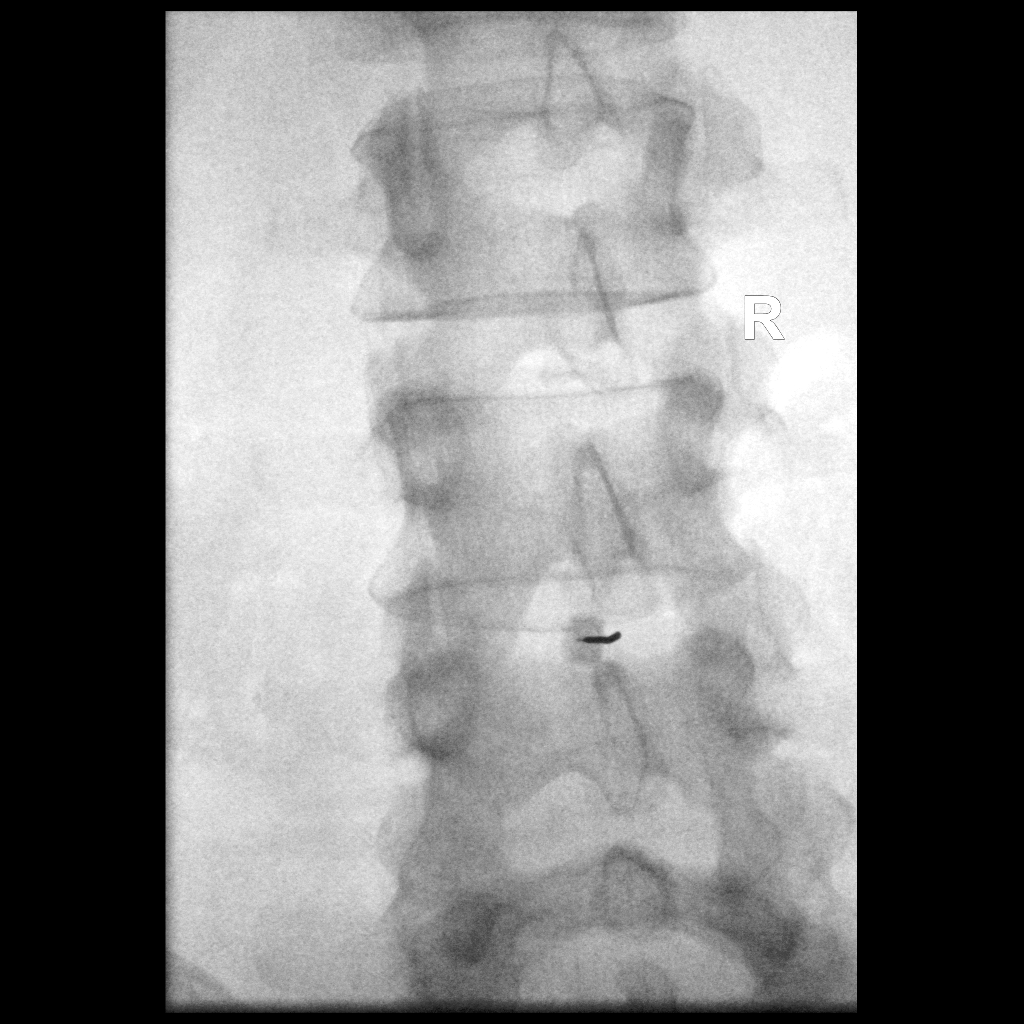

[2 of 2 positions shown; findings below may reference images not displayed]

An appropriate skin entry site was determined fluoroscopically.
Operator donned sterile gloves and mask. Skin site was marked, then
prepped with Betadine, draped in usual sterile fashion, and
infiltrated locally with 1% lidocaine. A 20 gauge spinal needle
advanced into the thecal sac at L3-L4 from a midline interlaminar
approach. Clear colorless CSF spontaneously returned, with opening
pressure of 12 cm water. 13.0ml CSF were collected and divided among
4 sterile vials for the requested laboratory studies. The needle was
then removed.

COMPLICATIONS:
None immediate
IMPRESSION: 1. Technically successful lumbar puncture under fluoroscopy.

## 2018-01-10 ENCOUNTER — Ambulatory Visit (INDEPENDENT_AMBULATORY_CARE_PROVIDER_SITE_OTHER): Payer: BLUE CROSS/BLUE SHIELD | Admitting: Family Medicine

## 2018-01-10 ENCOUNTER — Encounter: Payer: Self-pay | Admitting: Family Medicine

## 2018-01-10 VITALS — BP 162/86 | HR 73 | Temp 98.2°F | Resp 16 | Ht 62.5 in | Wt 208.0 lb

## 2018-01-10 DIAGNOSIS — N183 Chronic kidney disease, stage 3 unspecified: Secondary | ICD-10-CM

## 2018-01-10 DIAGNOSIS — R400 Somnolence: Secondary | ICD-10-CM

## 2018-01-10 DIAGNOSIS — R0683 Snoring: Secondary | ICD-10-CM | POA: Diagnosis not present

## 2018-01-10 DIAGNOSIS — I119 Hypertensive heart disease without heart failure: Secondary | ICD-10-CM | POA: Diagnosis not present

## 2018-01-10 DIAGNOSIS — E1122 Type 2 diabetes mellitus with diabetic chronic kidney disease: Secondary | ICD-10-CM

## 2018-01-10 DIAGNOSIS — E669 Obesity, unspecified: Secondary | ICD-10-CM

## 2018-01-10 DIAGNOSIS — Z794 Long term (current) use of insulin: Secondary | ICD-10-CM

## 2018-01-10 NOTE — Patient Instructions (Addendum)
I will refer you to the sleep center for further evaluation.   Hypertension Hypertension is another name for high blood pressure. High blood pressure forces your heart to work harder to pump blood. This can cause problems over time. There are two numbers in a blood pressure reading. There is a top number (systolic) over a bottom number (diastolic). It is best to have a blood pressure below 120/80. Healthy choices can help lower your blood pressure. You may need medicine to help lower your blood pressure if:  Your blood pressure cannot be lowered with healthy choices.  Your blood pressure is higher than 130/80.  Follow these instructions at home: Eating and drinking  If directed, follow the DASH eating plan. This diet includes: ? Filling half of your plate at each meal with fruits and vegetables. ? Filling one quarter of your plate at each meal with whole grains. Whole grains include whole wheat pasta, brown rice, and whole grain bread. ? Eating or drinking low-fat dairy products, such as skim milk or low-fat yogurt. ? Filling one quarter of your plate at each meal with low-fat (lean) proteins. Low-fat proteins include fish, skinless chicken, eggs, beans, and tofu. ? Avoiding fatty meat, cured and processed meat, or chicken with skin. ? Avoiding premade or processed food.  Eat less than 1,500 mg of salt (sodium) a day.  Limit alcohol use to no more than 1 drink a day for nonpregnant women and 2 drinks a day for men. One drink equals 12 oz of beer, 5 oz of wine, or 1 oz of hard liquor. Lifestyle  Work with your doctor to stay at a healthy weight or to lose weight. Ask your doctor what the best weight is for you.  Get at least 30 minutes of exercise that causes your heart to beat faster (aerobic exercise) most days of the week. This may include walking, swimming, or biking.  Get at least 30 minutes of exercise that strengthens your muscles (resistance exercise) at least 3 days a week.  This may include lifting weights or pilates.  Do not use any products that contain nicotine or tobacco. This includes cigarettes and e-cigarettes. If you need help quitting, ask your doctor.  Check your blood pressure at home as told by your doctor.  Keep all follow-up visits as told by your doctor. This is important. Medicines  Take over-the-counter and prescription medicines only as told by your doctor. Follow directions carefully.  Do not skip doses of blood pressure medicine. The medicine does not work as well if you skip doses. Skipping doses also puts you at risk for problems.  Ask your doctor about side effects or reactions to medicines that you should watch for. Contact a doctor if:  You think you are having a reaction to the medicine you are taking.  You have headaches that keep coming back (recurring).  You feel dizzy.  You have swelling in your ankles.  You have trouble with your vision. Get help right away if:  You get a very bad headache.  You start to feel confused.  You feel weak or numb.  You feel faint.  You get very bad pain in your: ? Chest. ? Belly (abdomen).  You throw up (vomit) more than once.  You have trouble breathing. Summary  Hypertension is another name for high blood pressure. Making healthy choices can help lower blood pressure. If your blood pressure cannot be controlled with healthy c Sleep Apnea Sleep apnea is a condition that affects  breathing. People with sleep apnea have moments during sleep when their breathing pauses briefly or gets shallow. Sleep apnea can cause these symptoms:  Trouble staying asleep.  Sleepiness or tiredness during the day.  Irritability.  Loud snoring.  Morning headaches.  Trouble concentrating.  Forgetting things.  Less interest in sex.  Being sleepy for no reason.  Mood swings.  Personality changes.  Depression.  Waking up a lot during the night to pee (urinate).  Dry  mouth.  Sore throat.  Follow these instructions at home:  Make any changes in your routine that your doctor recommends.  Eat a healthy, well-balanced diet.  Take over-the-counter and prescription medicines only as told by your doctor.  Avoid using alcohol, calming medicines (sedatives), and narcotic medicines.  Take steps to lose weight if you are overweight.  If you were given a machine (device) to use while you sleep, use it only as told by your doctor.  Do not use any tobacco products, such as cigarettes, chewing tobacco, and e-cigarettes. If you need help quitting, ask your doctor.  Keep all follow-up visits as told by your doctor. This is important. Contact a doctor if:  The machine that you were given to use during sleep is uncomfortable or does not seem to be working.  Your symptoms do not get better.  Your symptoms get worse. Get help right away if:  Your chest hurts.  You have trouble breathing in enough air (shortness of breath).  You have an uncomfortable feeling in your back, arms, or stomach.  You have trouble talking.  One side of your body feels weak.  A part of your face is hanging down (drooping). These symptoms may be an emergency. Do not wait to see if the symptoms will go away. Get medical help right away. Call your local emergency services (911 in the [place] Do not drive yourself to the hospital. This information is not intended to replace advice given to you by your health care provider. Make sure you discuss any questions you have with your health care provider. Document Released: 03/07/2008 Document Revised: 01/23/2016 Document Reviewed: 03/08/2015 Elsevier Interactive Patient Education  2018 [place], you may need to take medicine. This information is not intended to replace advice given to you by your health care provider. Make sure you discuss any questions you have with your health care provider. Document Released: 11/15/2007  Document Revised: 04/26/2016 Document Reviewed: 04/26/2016 Elsevier Interactive Patient Education  [employer].

## 2018-01-10 NOTE — Progress Notes (Signed)
  Subjective:    Patient is here for referral to sleep clinic for further evaluation of snoring.  Patient is followed by cardiology, nephrology, endocrinology and other specialists.  Patient with a hx of DM2 and is followed by endocrinology. Patient with HA1c of 10.6 3 weeks ago.  Patient states that she is snoring so loud that her neighbors have filed complaints due to the noise. Patient states that she has daytime sleepiness. Patient does not have trouble falling and staying asleep. States that she has tried several over the counter remedies without relief. Partner awakens her during the night due to snoring.   Review of Systems Pertinent items noted in HPI and remainder of comprehensive ROS otherwise negative.     Objective:    BP (!) 162/86 (BP Location: Left Arm, Patient Position: Sitting, Cuff Size: Large)   Pulse 73   Temp 98.2 F (36.8 C) (Oral)   Resp 16   Ht 5' 2.5" (1.588 m)   Wt 208 lb (94.3 kg)   SpO2 100%   BMI 37.44 kg/m  General appearance: alert, cooperative and moderately obese Head: Normocephalic, without obvious abnormality, atraumatic Eyes: conjunctivae/corneas clear. PERRL, EOM's intact. Fundi benign. Throat: lips, mucosa, and tongue normal; teeth and gums normal Lungs: clear to auscultation bilaterally Heart: regular rate and rhythm, S1, S2 normal, no murmur, click, rub or gallop Extremities: extremities normal, atraumatic, no cyanosis or edema Pulses: 2+ and symmetric Skin: Skin color, texture, turgor normal. No rashes or lesions Lymph nodes: Cervical, supraclavicular, and axillary nodes normal. Neurologic: Alert and oriented X 3, normal strength and tone. Normal symmetric reflexes. Normal coordination and gait    ASSESSMENT/PLAN:   Snoring  - Ambulatory referral to Sleep Studies  Daytime sleepiness  - Ambulatory referral to Sleep Studies  Obesity, unspecified classification, unspecified obesity type, unspecified whether serious comorbidity  present  - Ambulatory referral to Sleep Studies  Ms. Andr L. Nathaneil Canary, FNP-BC [facility] [address] [place], [place] [address] [phone]

## 2018-01-11 ENCOUNTER — Ambulatory Visit (INDEPENDENT_AMBULATORY_CARE_PROVIDER_SITE_OTHER): Payer: BLUE CROSS/BLUE SHIELD | Admitting: Endocrinology

## 2018-01-11 ENCOUNTER — Encounter: Payer: Self-pay | Admitting: Endocrinology

## 2018-01-11 ENCOUNTER — Encounter: Payer: Self-pay | Admitting: Emergency Medicine

## 2018-01-11 VITALS — BP 162/90 | HR 77 | Ht 62.5 in | Wt 209.0 lb

## 2018-01-11 DIAGNOSIS — Z794 Long term (current) use of insulin: Secondary | ICD-10-CM

## 2018-01-11 DIAGNOSIS — E1122 Type 2 diabetes mellitus with diabetic chronic kidney disease: Secondary | ICD-10-CM

## 2018-01-11 DIAGNOSIS — N183 Chronic kidney disease, stage 3 (moderate): Secondary | ICD-10-CM

## 2018-01-11 LAB — POCT GLYCOSYLATED HEMOGLOBIN (HGB A1C): Hemoglobin A1C: 10.3 % — AB (ref 4.0–5.6)

## 2018-01-11 MED ORDER — INSULIN DEGLUDEC 200 UNIT/ML ~~LOC~~ SOPN: 60.0000 [IU] | PEN_INJECTOR | Freq: Every day | SUBCUTANEOUS | 11 refills | Status: DC

## 2018-01-11 NOTE — Patient Instructions (Addendum)
Your blood pressure is high today.  Please see your primary care provider soon, to have it rechecked check your blood sugar twice a day.  vary the time of day when you check, between before the 3 meals, and at bedtime.  also check if you have symptoms of your blood sugar being too high or too low.  please keep a record of the readings and bring it to your next appointment here (or you can bring the meter itself).  You can write it on any piece of paper.  please call us sooner if your blood sugar goes below 70, or if you have a lot of readings over 200.   Please change the insulin to "tresiba," 60 units per day.  If you miss it, you can take the full amount when you remember. Please come back for a follow-up appointment in 2 months.

## 2018-01-11 NOTE — Progress Notes (Signed)
Subjective:    Patient ID: Christina Orozco, female    DOB: 12-12-1970, 47 y.o.   MRN: [id number]  HPI Pt returns for f/u of diabetes mellitus: DM type: Insulin-requiring type 2.   Dx'ed: 2355 Complications: polyneuropathy, PDR, renal failure, and CVA Therapy: insulin since soon after dx GDM: never DKA: never Severe hypoglycemia: never Pancreatitis: never Pancreatic imaging: normal on 2018 CT Other: she takes QD insulin, after poor results with multiple daily injections.  Interval history: Pt says she sometimes misses the insulin, as she dislikes giving the injections.  Main symptom is burning at injection sites.  She often forgets to take the insulin until later in the day.   Past Medical History:  Diagnosis Date  . Anemia    severe  . Asthma   . CKD (chronic kidney disease)    stage IV  . DDD (degenerative disc disease), cervical   . Diabetes mellitus ([place])   . Dislocated shoulder    right  . GERD (gastroesophageal reflux disease)   . Hypertension   . Neuropathy   . Stroke [facility])     Past Surgical History:  Procedure Laterality Date  . ADENOIDECTOMY    . APPENDECTOMY    . CERVICAL SPINE SURGERY  06/2012   C5-C6  . EYE SURGERY     left eye surgery   . FOOT SURGERY    . LOOP RECORDER INSERTION N/A 08/27/2017   Procedure: LOOP RECORDER INSERTION;  Surgeon: Sanda Klein, MD;  Location: [facility];  Service: Cardiovascular;  Laterality: N/A;  . SHOULDER SURGERY    . TEE WITHOUT CARDIOVERSION N/A 08/27/2017   Procedure: TRANSESOPHAGEAL ECHOCARDIOGRAM (TEE);  Surgeon: Sanda Klein, MD;  Location: [facility] ENDOSCOPY;  Service: Cardiovascular;  Laterality: N/A;  . TONSILLECTOMY      Social History   Socioeconomic History  . Marital status: Single    Spouse name: Not on file  . Number of children: 3  . Years of education: 12+  . Highest education level: Not on file  Occupational History  . Not on file  Social Needs  . Financial resource strain: Not on  file  . Food insecurity:    Worry: Not on file    Inability: Not on file  . Transportation needs:    Medical: Not on file    Non-medical: Not on file  Tobacco Use  . Smoking status: Never Smoker  . Smokeless tobacco: Never Used  Substance and Sexual Activity  . Alcohol use: No  . Drug use: No  . Sexual activity: Not on file  Lifestyle  . Physical activity:    Days per week: Not on file    Minutes per session: Not on file  . Stress: Not on file  Relationships  . Social connections:    Talks on phone: Not on file    Gets together: Not on file    Attends religious service: Not on file    Active member of club or organization: Not on file    Attends meetings of clubs or organizations: Not on file    Relationship status: Not on file  . Intimate partner violence:    Fear of current or ex partner: Not on file    Emotionally abused: Not on file    Physically abused: Not on file    Forced sexual activity: Not on file  Other Topics Concern  . Not on file  Social History Narrative   Lives with daughter   Caffeine use: 3 cups  daily   Right handed    Current Outpatient Medications on File Prior to Visit  Medication Sig Dispense Refill  . atorvastatin (LIPITOR) 40 MG tablet TAKE 1 TABLET BY MOUTH EVERY EVENING AT 6PM 30 tablet 0  . carvedilol (COREG) 25 MG tablet Take 1 tablet (25 mg total) by mouth 2 (two) times daily with a meal. Take 25 mg twice daily (Patient taking differently: Take 25 mg by mouth 2 (two) times daily with a meal. ) 180 tablet 3  . clopidogrel (PLAVIX) 75 MG tablet Take 1 tablet (75 mg total) by mouth daily. 30 tablet 4  . doxazosin (CARDURA) 4 MG tablet Take 1 tablet (4 mg total) by mouth daily. 90 tablet 1  . ferrous sulfate 325 (65 FE) MG tablet Take 1 tablet (325 mg total) by mouth daily. 30 tablet 3  . furosemide (LASIX) 40 MG tablet Take 40 mg by mouth 2 (two) times daily.    Marland Kitchen glucose blood (CONTOUR NEXT TEST) test strip 1 each by Other route 2 (two)  times daily. And lancets 2/day 100 each 12  . hydrochlorothiazide (HYDRODIURIL) 25 MG tablet Take 25 mg by mouth 3 (three) times a week. Monday, Wednesday, Friday    . meclizine (ANTIVERT) 25 MG tablet TAKE 1 TABLET(25 MG) BY MOUTH EVERY 8 HOURS AS NEEDED FOR DIZZINESS 90 tablet 0  . PROAIR HFA 108 (90 Base) MCG/ACT inhaler INHALE 1 OR 2 PUFFS INTO THE LUNGS EVERY 6 HOURS AS NEEDED FOR WHEEZING OR SHORTNESS OF BREATH 8.5 g 0  . amLODipine (NORVASC) 5 MG tablet Take 1.5 tablets (7.5 mg total) by mouth daily. 45 tablet 5  . hydrALAZINE (APRESOLINE) 50 MG tablet Take 1 tablet (50 mg total) by mouth 3 (three) times daily. 90 tablet 5   No current facility-administered medications on file prior to visit.     Allergies  Allergen Reactions  . Lisinopril Swelling and Other (See Comments)    Facial swelling   . Penicillins Hives    Has patient had a PCN reaction causing immediate rash, facial/tongue/throat swelling, SOB or lightheadedness with hypotension: yes Has patient had a PCN reaction causing severe rash involving mucus membranes or skin necrosis: No Has patient had a PCN reaction that required hospitalization No Has patient had a PCN reaction occurring within the last 10 years: No If all of the above answers are "NO", then may proceed wit  . Gabapentin Swelling and Other (See Comments)    "I thought I was having another stroke."     Family History  Problem Relation Age of Onset  . Vascular Disease Mother   . CAD Mother   . Heart failure Mother   . Heart disease Unknown   . Cancer Unknown        colon, stomach, lung  . Diabetes Unknown   . Seizures Unknown   . Breast cancer Sister 30    BP (!) 162/90 (BP Location: Left Arm, Patient Position: Sitting, Cuff Size: Normal)   Pulse 77   Ht 5' 2.5" (1.588 m)   Wt 209 lb (94.8 kg)   SpO2 97%   BMI 37.62 kg/m   Review of Systems She denies hypoglycemia.      Objective:   Physical Exam VITAL SIGNS:  See vs page GENERAL: no  distress Pulses: dorsalis pedis intact bilat.   MSK: no deformity of the feet CV: trace bilat leg edema Skin:  no ulcer on the feet.  normal color and temp on the feet.  Neuro: sensation is intact to touch on the feet.     Lab Results  Component Value Date   HGBA1C 10.3 (A) 01/11/2018   Lab Results  Component Value Date   CREATININE 2.55 (H) 11/01/2017   BUN 35 (H) 11/01/2017   NA 138 11/01/2017   K 3.8 11/01/2017   CL 108 11/01/2017   CO2 24 11/01/2017       Assessment & Plan:  HTN: is noted today Insulin-requiring type 2 DM, with renal; failure: poor glycemic control Missing insulin: we'll change to tresiba, for dosing flexibility   Patient Instructions  Your blood pressure is high today.  Please see your primary care provider soon, to have it rechecked check your blood sugar twice a day.  vary the time of day when you check, between before the 3 meals, and at bedtime.  also check if you have symptoms of your blood sugar being too high or too low.  please keep a record of the readings and bring it to your next appointment here (or you can bring the meter itself).  You can write it on any piece of paper.  please call us sooner if your blood sugar goes below 70, or if you have a lot of readings over 200.   Please change the insulin to "tresiba," 60 units per day.  If you miss it, you can take the full amount when you remember. Please come back for a follow-up appointment in 2 months.

## 2018-01-15 ENCOUNTER — Encounter (HOSPITAL_COMMUNITY): Payer: BLUE CROSS/BLUE SHIELD

## 2018-01-15 LAB — CUP PACEART REMOTE DEVICE CHECK
Date Time Interrogation Session: 20190622184135
MDC IDC PG IMPLANT DT: 20190318

## 2018-01-18 ENCOUNTER — Ambulatory Visit (HOSPITAL_COMMUNITY)
Admission: RE | Admit: 2018-01-18 | Discharge: 2018-01-18 | Disposition: A | Discharge status: Home / Self Care | Payer: BLUE CROSS/BLUE SHIELD | Source: Ambulatory Visit | Attending: Nephrology | Admitting: Nephrology

## 2018-01-18 VITALS — BP 151/78 | HR 82 | Temp 98.5°F | Resp 20 | Ht 62.0 in | Wt 208.0 lb

## 2018-01-18 DIAGNOSIS — Z5181 Encounter for therapeutic drug level monitoring: Secondary | ICD-10-CM | POA: Insufficient documentation

## 2018-01-18 DIAGNOSIS — N183 Chronic kidney disease, stage 3 unspecified: Secondary | ICD-10-CM

## 2018-01-18 DIAGNOSIS — Z79899 Other long term (current) drug therapy: Secondary | ICD-10-CM | POA: Diagnosis not present

## 2018-01-18 DIAGNOSIS — D631 Anemia in chronic kidney disease: Secondary | ICD-10-CM | POA: Insufficient documentation

## 2018-01-18 LAB — POCT HEMOGLOBIN-HEMACUE: HEMOGLOBIN: 10.2 g/dL — AB (ref 12.0–15.0)

## 2018-01-18 LAB — IRON AND TIBC
Iron: 43 ug/dL (ref 28–170)
Saturation Ratios: 22 % (ref 10.4–31.8)
TIBC: 196 ug/dL — AB (ref 250–450)
UIBC: 153 ug/dL

## 2018-01-18 LAB — FERRITIN: Ferritin: 151 ng/mL (ref 11–307)

## 2018-01-18 MED ORDER — DARBEPOETIN ALFA 100 MCG/0.5ML IJ SOSY
PREFILLED_SYRINGE | INTRAMUSCULAR | Status: AC
  Filled 2018-01-18: qty 0.5

## 2018-01-18 MED ORDER — DARBEPOETIN ALFA 100 MCG/0.5ML IJ SOSY
100.0000 ug | PREFILLED_SYRINGE | INTRAMUSCULAR | Status: DC
  Administered 2018-01-18: 100 ug via SUBCUTANEOUS

## 2018-01-28 ENCOUNTER — Telehealth: Payer: Self-pay

## 2018-01-28 DIAGNOSIS — E669 Obesity, unspecified: Secondary | ICD-10-CM

## 2018-01-28 DIAGNOSIS — R400 Somnolence: Secondary | ICD-10-CM

## 2018-01-28 DIAGNOSIS — R0683 Snoring: Secondary | ICD-10-CM

## 2018-01-28 NOTE — Telephone Encounter (Signed)
Spoke with sleep center. Order for sleep study was entered incorrectly, this has been updated and sent to provider for signoff . Thanks!

## 2018-02-01 ENCOUNTER — Encounter: Payer: Self-pay | Admitting: *Deleted

## 2018-02-01 ENCOUNTER — Ambulatory Visit (INDEPENDENT_AMBULATORY_CARE_PROVIDER_SITE_OTHER): Payer: BLUE CROSS/BLUE SHIELD | Admitting: Cardiovascular Disease

## 2018-02-01 ENCOUNTER — Encounter: Payer: Self-pay | Admitting: Cardiovascular Disease

## 2018-02-01 ENCOUNTER — Other Ambulatory Visit: Payer: Self-pay | Admitting: Family Medicine

## 2018-02-01 VITALS — BP 144/86 | HR 72 | Ht 62.5 in | Wt 205.8 lb

## 2018-02-01 DIAGNOSIS — I639 Cerebral infarction, unspecified: Secondary | ICD-10-CM

## 2018-02-01 DIAGNOSIS — Z5181 Encounter for therapeutic drug level monitoring: Secondary | ICD-10-CM | POA: Diagnosis not present

## 2018-02-01 DIAGNOSIS — I10 Essential (primary) hypertension: Secondary | ICD-10-CM

## 2018-02-01 DIAGNOSIS — R400 Somnolence: Secondary | ICD-10-CM

## 2018-02-01 DIAGNOSIS — E7800 Pure hypercholesterolemia, unspecified: Secondary | ICD-10-CM | POA: Diagnosis not present

## 2018-02-01 MED ORDER — HYDRALAZINE HCL 100 MG PO TABS: 100.0000 mg | ORAL_TABLET | Freq: Three times a day (TID) | ORAL | 5 refills | Status: DC

## 2018-02-01 MED ORDER — AMLODIPINE BESYLATE 10 MG PO TABS: 10.0000 mg | ORAL_TABLET | Freq: Every day | ORAL | 3 refills | Status: DC

## 2018-02-01 MED ORDER — CLOPIDOGREL BISULFATE 75 MG PO TABS: 75.0000 mg | ORAL_TABLET | Freq: Every day | ORAL | 1 refills | Status: DC

## 2018-02-01 MED ORDER — FUROSEMIDE 80 MG PO TABS: 80.0000 mg | ORAL_TABLET | Freq: Two times a day (BID) | ORAL | 1 refills | Status: DC

## 2018-02-01 NOTE — Patient Instructions (Signed)
Medication Instructions:  STOP HYDROCHLOROTHIAZIDE   INCREASE AMLODIPINE TO 10 MG DAILY   INCREASE HYDRALAZINE TO 100 MG THREE TIMES A DAY  INCREASE FUROSEMIDE TO 80 MG TWICE A DAY   Labwork: BMET IN WEEK  Testing/Procedures: NONE  Follow-Up: Your physician recommends that you schedule a follow-up appointment in: PHARM D FOR BLOOD PRESSURE IN 2 WEEKS OR AS SOON AS POSSIBLE  Your physician recommends that you schedule a follow-up appointment in: [facility] IN 2 MONTHS   If you need a refill on your cardiac medications before your next appointment, please call your pharmacy.

## 2018-02-01 NOTE — Progress Notes (Signed)
Cardiology Office Note   Date:  02/01/2018   ID:  Christina Orozco, DOB [birth year], MRN [id number]  PCP:  Lanae Boast, [place]  Cardiologist:   Skeet Latch, MD   No chief complaint on file.    History of Present Illness: Christina Orozco is a 47 y.o. female with hypertension, severe LVH, diabetes, recurrent stroke, and asthma who presents for follow-up. She was initially seen 12/2016 for an evaluation of hypertension and LVH.  She had an echo 04/2016 that revealed LVEF 65-70% and mild basal septum hypertrophy.  She had a repeat echo 12/01/16 that showed LVEF 65-70% and severe LVH.  She reports that she was initially diagnosed with hypertension in high school.  The only itme it was well-controlled was when she was pregnant. She has several family members with hypertension.  Her mother had a heart attack at age 86 and several other family members had premature CAD.  Carvedilol was increased, amlodipine and doxazosin were added.    At her last appointment Christina Orozco subsequently admitted 07/2017 with an acute stroke of the left paramedian pons.  She presented with dizziness and her needed admission blood pressure was 148/85.  Carotid Dopplers revealed 1-39% ICA stenosis and echo was unchanged from 11/2016.  She was scheduled for TEE 08/2017 that revealed no PFO.  A loop recorder was placed at that time.  At her last appointment her blood pressure remained above goal so hydralazine was increased.  Since that time her blood pressure has been mostly in the 160s over 90s at home.  She has been under a lot of stress.  She is not getting along with her upstairs neighbor or her supervisor at work.  This is caused a lot of stress and she thinks is contributing to her elevated blood pressure.   Past Medical History:  Diagnosis Date  . Anemia    severe  . Asthma   . CKD (chronic kidney disease)    stage IV  . DDD (degenerative disc disease), cervical   . Diabetes mellitus ([place])   . Dislocated  shoulder    right  . GERD (gastroesophageal reflux disease)   . Hypertension   . Neuropathy   . Stroke [facility])     Past Surgical History:  Procedure Laterality Date  . ADENOIDECTOMY    . APPENDECTOMY    . CERVICAL SPINE SURGERY  06/2012   C5-C6  . EYE SURGERY     left eye surgery   . FOOT SURGERY    . LOOP RECORDER INSERTION N/A 08/27/2017   Procedure: LOOP RECORDER INSERTION;  Surgeon: Sanda Klein, MD;  Location: [place] [facility];  Service: Cardiovascular;  Laterality: N/A;  . SHOULDER SURGERY    . TEE WITHOUT CARDIOVERSION N/A 08/27/2017   Procedure: TRANSESOPHAGEAL ECHOCARDIOGRAM (TEE);  Surgeon: Sanda Klein, MD;  Location: [facility] ENDOSCOPY;  Service: Cardiovascular;  Laterality: N/A;  . TONSILLECTOMY       Current Outpatient Medications  Medication Sig Dispense Refill  . atorvastatin (LIPITOR) 40 MG tablet TAKE 1 TABLET BY MOUTH EVERY EVENING AT 6PM 30 tablet 0  . carvedilol (COREG) 25 MG tablet Take 1 tablet (25 mg total) by mouth 2 (two) times daily with a meal. Take 25 mg twice daily (Patient taking differently: Take 25 mg by mouth 2 (two) times daily with a meal. ) 180 tablet 3  . clopidogrel (PLAVIX) 75 MG tablet Take 1 tablet (75 mg total) by mouth daily. 90 tablet 1  . doxazosin (CARDURA) 4 MG  tablet Take 1 tablet (4 mg total) by mouth daily. 90 tablet 1  . ferrous sulfate 325 (65 FE) MG tablet Take 1 tablet (325 mg total) by mouth daily. 30 tablet 3  . furosemide (LASIX) 80 MG tablet Take 1 tablet (80 mg total) by mouth 2 (two) times daily. 180 tablet 1  . glucose blood (CONTOUR NEXT TEST) test strip 1 each by Other route 2 (two) times daily. And lancets 2/day 100 each 12  . Insulin Degludec (TRESIBA FLEXTOUCH) 200 UNIT/ML SOPN Inject 60 Units into the skin daily. And pen needles 1/day 6 pen 11  . meclizine (ANTIVERT) 25 MG tablet TAKE 1 TABLET(25 MG) BY MOUTH EVERY 8 HOURS AS NEEDED FOR DIZZINESS 90 tablet 0  . PROAIR HFA 108 (90 Base) MCG/ACT inhaler INHALE 1 OR 2  PUFFS INTO THE LUNGS EVERY 6 HOURS AS NEEDED FOR WHEEZING OR SHORTNESS OF BREATH 8.5 g 0  . amLODipine (NORVASC) 10 MG tablet Take 1 tablet (10 mg total) by mouth daily. 90 tablet 3  . hydrALAZINE (APRESOLINE) 100 MG tablet Take 1 tablet (100 mg total) by mouth 3 (three) times daily. 90 tablet 5   No current facility-administered medications for this visit.     Allergies:   Lisinopril; Penicillins; and Gabapentin    Social History:  The patient  reports that she has never smoked. She has never used smokeless tobacco. She reports that she does not drink alcohol or use drugs.   Family History:  The patient's family history includes Breast cancer (age of onset: 12) in her sister; CAD in her mother; Cancer in her unknown relative; Diabetes in her unknown relative; Heart disease in her unknown relative; Heart failure in her mother; Seizures in her unknown relative; Vascular Disease in her mother.    ROS:  Please see the history of present illness.   Otherwise, review of systems are positive for none.   All other systems are reviewed and negative.    PHYSICAL EXAM: VS:  BP (!) 144/86   Pulse 72   Ht 5' 2.5" (1.588 m)   Wt 205 lb 12.8 oz (93.4 kg)   BMI 37.04 kg/m  , BMI Body mass index is 37.04 kg/m. GENERAL:  Well appearing HEENT: Pupils equal round and reactive, fundi not visualized, oral mucosa unremarkable NECK:  No jugular venous distention, waveform within normal limits, carotid upstroke brisk and symmetric, no bruits, + thyromegaly LUNGS:  Clear to auscultation bilaterally HEART:  RRR.  PMI not displaced or sustained,S1 and S2 within normal limits, no S3, no S4, no clicks, no rubs, no murmurs ABD:  Flat, positive bowel sounds normal in frequency in pitch, no bruits, no rebound, no guarding, no midline pulsatile mass, no hepatomegaly, no splenomegaly EXT:  2 plus pulses throughout, 1+ LE edema to the ankles bilaterally, no cyanosis no clubbing SKIN:  No rashes no nodules NEURO:   Cranial nerves II through XII grossly intact, motor grossly intact throughout PSYCH:  Cognitively intact, oriented to person place and time   EKG:  EKG is ordered today. The ekg ordered 12/14/16 demonstrates sinus rhythm.  Rate 75 bpm.  Cannot rule out prior septal infarct.   09/18/17: Sinus rhythm.  Rate 78 bpm.  Cannot rule out prior septal infarct.   Recent Labs: 07/26/2017: ALT 11 07/27/2017: TSH 1.883 11/01/2017: BUN 35; Creatinine, Ser 2.55; Platelets 328; Potassium 3.8; Sodium 138 01/18/2018: Hemoglobin 10.2    Lipid Panel    Component Value Date/Time   CHOL 99 07/26/2017  1118   TRIG 70 07/26/2017 1118   HDL 42 07/26/2017 1118   CHOLHDL 2.4 07/26/2017 1118   VLDL 14 07/26/2017 1118   LDLCALC 43 07/26/2017 1118      Wt Readings from Last 3 Encounters:  02/01/18 205 lb 12.8 oz (93.4 kg)  01/18/18 208 lb (94.3 kg)  01/11/18 209 lb (94.8 kg)      ASSESSMENT AND PLAN:  # Hypertensive heart disease:   BP remains above goal. She shouldn't be on both HCTZ and furosemide, especially given her renal function.  We will stop HCTZ and increase lasix to 80mg  bid.  Increase amlodipine to 10mg .  Continue carvedilol and doxazosin.  Increase hydralazine to 100mg  tid.  Repeat BMP in 1 week.  # Acute on chronic renal failure: Stop HCTZ as above.  She is going to see nephrology.   # Recurrent stroke: BP control as above.  Continue atorvastatin and clopidogrel.   Current medicines are reviewed at length with the patient today.  The patient does not have concerns regarding medicines.  The following changes have been made:    Labs/ tests ordered today include:   Orders Placed This Encounter  Procedures  . Basic metabolic panel     Disposition:   FU with Lylee Corrow C. Oval Linsey, MD, [facility] in 2 months.  PharmD in 2 weeks.     Signed, Antoninette Lerner C. Oval Linsey, MD, [facility]  02/01/2018 4:28 PM    [place]

## 2018-02-05 ENCOUNTER — Ambulatory Visit (INDEPENDENT_AMBULATORY_CARE_PROVIDER_SITE_OTHER): Payer: BLUE CROSS/BLUE SHIELD | Admitting: *Deleted

## 2018-02-05 DIAGNOSIS — I639 Cerebral infarction, unspecified: Secondary | ICD-10-CM

## 2018-02-05 NOTE — Progress Notes (Signed)
Carelink Summary Report / Loop Recorder 

## 2018-02-14 DIAGNOSIS — G5603 Carpal tunnel syndrome, bilateral upper limbs: Secondary | ICD-10-CM | POA: Insufficient documentation

## 2018-02-15 ENCOUNTER — Ambulatory Visit (HOSPITAL_COMMUNITY)
Admission: RE | Admit: 2018-02-15 | Discharge: 2018-02-15 | Disposition: A | Discharge status: Home / Self Care | Payer: BLUE CROSS/BLUE SHIELD | Source: Ambulatory Visit | Attending: Nephrology | Admitting: Nephrology

## 2018-02-15 VITALS — BP 180/94 | HR 78 | Temp 98.7°F | Resp 20

## 2018-02-15 DIAGNOSIS — N183 Chronic kidney disease, stage 3 unspecified: Secondary | ICD-10-CM

## 2018-02-15 DIAGNOSIS — D631 Anemia in chronic kidney disease: Secondary | ICD-10-CM | POA: Diagnosis not present

## 2018-02-15 LAB — IRON AND TIBC
Iron: 47 ug/dL (ref 28–170)
Saturation Ratios: 25 % (ref 10.4–31.8)
TIBC: 189 ug/dL — ABNORMAL LOW (ref 250–450)
UIBC: 142 ug/dL

## 2018-02-15 LAB — FERRITIN: Ferritin: 219 ng/mL (ref 11–307)

## 2018-02-15 LAB — POCT HEMOGLOBIN-HEMACUE: HEMOGLOBIN: 10.5 g/dL — AB (ref 12.0–15.0)

## 2018-02-15 MED ORDER — DARBEPOETIN ALFA 100 MCG/0.5ML IJ SOSY
100.0000 ug | PREFILLED_SYRINGE | INTRAMUSCULAR | Status: DC
  Administered 2018-02-15: 100 ug via SUBCUTANEOUS

## 2018-02-15 MED ORDER — DARBEPOETIN ALFA 100 MCG/0.5ML IJ SOSY
PREFILLED_SYRINGE | INTRAMUSCULAR | Status: AC
  Filled 2018-02-15: qty 0.5

## 2018-02-18 ENCOUNTER — Telehealth: Payer: Self-pay | Admitting: Nurse Practitioner

## 2018-02-18 LAB — CUP PACEART REMOTE DEVICE CHECK
Implantable Pulse Generator Implant Date: 20190318
MDC IDC SESS DTM: 20190725184039

## 2018-02-18 NOTE — Telephone Encounter (Signed)
Patient has been diagnosed with carpal tunnel syndrome by Dr.Fred Rodell Perna.  She is to follow-up with him on October 15

## 2018-02-19 ENCOUNTER — Ambulatory Visit: Payer: BLUE CROSS/BLUE SHIELD

## 2018-03-05 LAB — CUP PACEART REMOTE DEVICE CHECK
Date Time Interrogation Session: 20190827193921
Implantable Pulse Generator Implant Date: 20190318

## 2018-03-10 ENCOUNTER — Ambulatory Visit (HOSPITAL_BASED_OUTPATIENT_CLINIC_OR_DEPARTMENT_OTHER): Payer: BLUE CROSS/BLUE SHIELD

## 2018-03-11 ENCOUNTER — Ambulatory Visit (INDEPENDENT_AMBULATORY_CARE_PROVIDER_SITE_OTHER): Payer: BLUE CROSS/BLUE SHIELD | Admitting: *Deleted

## 2018-03-11 DIAGNOSIS — I639 Cerebral infarction, unspecified: Secondary | ICD-10-CM

## 2018-03-11 NOTE — Progress Notes (Signed)
Carelink Summary Report / Loop Recorder 

## 2018-03-13 LAB — CUP PACEART REMOTE DEVICE CHECK
Date Time Interrogation Session: 20190929200721
Implantable Pulse Generator Implant Date: 20190318

## 2018-03-14 ENCOUNTER — Ambulatory Visit: Payer: BLUE CROSS/BLUE SHIELD | Admitting: Adult Health

## 2018-03-15 ENCOUNTER — Encounter (HOSPITAL_COMMUNITY): Payer: Self-pay

## 2018-03-15 ENCOUNTER — Ambulatory Visit (HOSPITAL_COMMUNITY)
Admission: RE | Admit: 2018-03-15 | Discharge: 2018-03-15 | Disposition: A | Discharge status: Home / Self Care | Payer: BLUE CROSS/BLUE SHIELD | Source: Ambulatory Visit | Attending: Nephrology | Admitting: Nephrology

## 2018-03-15 VITALS — BP 142/83 | HR 81 | Temp 98.2°F | Resp 20

## 2018-03-15 DIAGNOSIS — N183 Chronic kidney disease, stage 3 unspecified: Secondary | ICD-10-CM

## 2018-03-15 LAB — IRON AND TIBC
IRON: 48 ug/dL (ref 28–170)
Saturation Ratios: 26 % (ref 10.4–31.8)
TIBC: 186 ug/dL — ABNORMAL LOW (ref 250–450)
UIBC: 138 ug/dL

## 2018-03-15 LAB — FERRITIN: FERRITIN: 248 ng/mL (ref 11–307)

## 2018-03-15 LAB — POCT HEMOGLOBIN-HEMACUE: Hemoglobin: 10.5 g/dL — ABNORMAL LOW (ref 12.0–15.0)

## 2018-03-15 MED ORDER — DARBEPOETIN ALFA 100 MCG/0.5ML IJ SOSY
100.0000 ug | PREFILLED_SYRINGE | INTRAMUSCULAR | Status: DC
  Administered 2018-03-15: 100 ug via SUBCUTANEOUS

## 2018-03-15 MED ORDER — DARBEPOETIN ALFA 100 MCG/0.5ML IJ SOSY
PREFILLED_SYRINGE | INTRAMUSCULAR | Status: AC
  Administered 2018-03-15: 100 ug via SUBCUTANEOUS
  Filled 2018-03-15: qty 0.5

## 2018-03-19 ENCOUNTER — Ambulatory Visit: Payer: BLUE CROSS/BLUE SHIELD | Admitting: Endocrinology

## 2018-03-25 ENCOUNTER — Inpatient Hospital Stay (HOSPITAL_COMMUNITY): Payer: BLUE CROSS/BLUE SHIELD

## 2018-03-25 ENCOUNTER — Encounter (HOSPITAL_COMMUNITY): Payer: Self-pay | Admitting: *Deleted

## 2018-03-25 ENCOUNTER — Telehealth: Payer: Self-pay | Admitting: Cardiology

## 2018-03-25 ENCOUNTER — Emergency Department (HOSPITAL_COMMUNITY): Payer: BLUE CROSS/BLUE SHIELD

## 2018-03-25 ENCOUNTER — Other Ambulatory Visit: Payer: Self-pay

## 2018-03-25 ENCOUNTER — Inpatient Hospital Stay (HOSPITAL_COMMUNITY)
Admission: EM | Admit: 2018-03-25 | Discharge: 2018-04-02 | DRG: 100 | Disposition: A | Discharge status: Rehabilitation Facility | Payer: BLUE CROSS/BLUE SHIELD | Attending: Internal Medicine | Admitting: Internal Medicine

## 2018-03-25 DIAGNOSIS — E872 Acidosis: Secondary | ICD-10-CM | POA: Diagnosis present

## 2018-03-25 DIAGNOSIS — E11649 Type 2 diabetes mellitus with hypoglycemia without coma: Secondary | ICD-10-CM | POA: Diagnosis not present

## 2018-03-25 DIAGNOSIS — E1165 Type 2 diabetes mellitus with hyperglycemia: Secondary | ICD-10-CM | POA: Diagnosis present

## 2018-03-25 DIAGNOSIS — K219 Gastro-esophageal reflux disease without esophagitis: Secondary | ICD-10-CM | POA: Diagnosis present

## 2018-03-25 DIAGNOSIS — E878 Other disorders of electrolyte and fluid balance, not elsewhere classified: Secondary | ICD-10-CM | POA: Diagnosis present

## 2018-03-25 DIAGNOSIS — R569 Unspecified convulsions: Secondary | ICD-10-CM

## 2018-03-25 DIAGNOSIS — J9600 Acute respiratory failure, unspecified whether with hypoxia or hypercapnia: Secondary | ICD-10-CM

## 2018-03-25 DIAGNOSIS — Z880 Allergy status to penicillin: Secondary | ICD-10-CM

## 2018-03-25 DIAGNOSIS — N184 Chronic kidney disease, stage 4 (severe): Secondary | ICD-10-CM | POA: Diagnosis not present

## 2018-03-25 DIAGNOSIS — R2689 Other abnormalities of gait and mobility: Secondary | ICD-10-CM | POA: Diagnosis not present

## 2018-03-25 DIAGNOSIS — G9340 Encephalopathy, unspecified: Secondary | ICD-10-CM | POA: Diagnosis not present

## 2018-03-25 DIAGNOSIS — I10 Essential (primary) hypertension: Secondary | ICD-10-CM

## 2018-03-25 DIAGNOSIS — I129 Hypertensive chronic kidney disease with stage 1 through stage 4 chronic kidney disease, or unspecified chronic kidney disease: Secondary | ICD-10-CM | POA: Diagnosis not present

## 2018-03-25 DIAGNOSIS — J181 Lobar pneumonia, unspecified organism: Secondary | ICD-10-CM | POA: Diagnosis not present

## 2018-03-25 DIAGNOSIS — G9341 Metabolic encephalopathy: Secondary | ICD-10-CM | POA: Diagnosis not present

## 2018-03-25 DIAGNOSIS — Z0189 Encounter for other specified special examinations: Secondary | ICD-10-CM | POA: Diagnosis not present

## 2018-03-25 DIAGNOSIS — G40909 Epilepsy, unspecified, not intractable, without status epilepticus: Secondary | ICD-10-CM | POA: Diagnosis not present

## 2018-03-25 DIAGNOSIS — Z6835 Body mass index (BMI) 35.0-35.9, adult: Secondary | ICD-10-CM | POA: Diagnosis not present

## 2018-03-25 DIAGNOSIS — N179 Acute kidney failure, unspecified: Secondary | ICD-10-CM | POA: Diagnosis present

## 2018-03-25 DIAGNOSIS — E785 Hyperlipidemia, unspecified: Secondary | ICD-10-CM | POA: Diagnosis not present

## 2018-03-25 DIAGNOSIS — J4520 Mild intermittent asthma, uncomplicated: Secondary | ICD-10-CM | POA: Diagnosis not present

## 2018-03-25 DIAGNOSIS — E46 Unspecified protein-calorie malnutrition: Secondary | ICD-10-CM | POA: Diagnosis not present

## 2018-03-25 DIAGNOSIS — E876 Hypokalemia: Secondary | ICD-10-CM | POA: Diagnosis present

## 2018-03-25 DIAGNOSIS — Z7902 Long term (current) use of antithrombotics/antiplatelets: Secondary | ICD-10-CM

## 2018-03-25 DIAGNOSIS — D649 Anemia, unspecified: Secondary | ICD-10-CM | POA: Diagnosis not present

## 2018-03-25 DIAGNOSIS — J449 Chronic obstructive pulmonary disease, unspecified: Secondary | ICD-10-CM | POA: Diagnosis present

## 2018-03-25 DIAGNOSIS — N183 Chronic kidney disease, stage 3 (moderate): Secondary | ICD-10-CM | POA: Diagnosis not present

## 2018-03-25 DIAGNOSIS — Z79899 Other long term (current) drug therapy: Secondary | ICD-10-CM

## 2018-03-25 DIAGNOSIS — I6783 Posterior reversible encephalopathy syndrome: Secondary | ICD-10-CM | POA: Diagnosis not present

## 2018-03-25 DIAGNOSIS — E1140 Type 2 diabetes mellitus with diabetic neuropathy, unspecified: Secondary | ICD-10-CM | POA: Diagnosis present

## 2018-03-25 DIAGNOSIS — R739 Hyperglycemia, unspecified: Secondary | ICD-10-CM | POA: Diagnosis not present

## 2018-03-25 DIAGNOSIS — J988 Other specified respiratory disorders: Secondary | ICD-10-CM | POA: Diagnosis not present

## 2018-03-25 DIAGNOSIS — Z87898 Personal history of other specified conditions: Secondary | ICD-10-CM

## 2018-03-25 DIAGNOSIS — R1310 Dysphagia, unspecified: Secondary | ICD-10-CM | POA: Diagnosis present

## 2018-03-25 DIAGNOSIS — R29810 Facial weakness: Secondary | ICD-10-CM | POA: Diagnosis present

## 2018-03-25 DIAGNOSIS — R0602 Shortness of breath: Secondary | ICD-10-CM | POA: Diagnosis not present

## 2018-03-25 DIAGNOSIS — I161 Hypertensive emergency: Secondary | ICD-10-CM

## 2018-03-25 DIAGNOSIS — Z8249 Family history of ischemic heart disease and other diseases of the circulatory system: Secondary | ICD-10-CM

## 2018-03-25 DIAGNOSIS — E1122 Type 2 diabetes mellitus with diabetic chronic kidney disease: Secondary | ICD-10-CM | POA: Diagnosis present

## 2018-03-25 DIAGNOSIS — E869 Volume depletion, unspecified: Secondary | ICD-10-CM | POA: Diagnosis present

## 2018-03-25 DIAGNOSIS — I169 Hypertensive crisis, unspecified: Secondary | ICD-10-CM | POA: Diagnosis not present

## 2018-03-25 DIAGNOSIS — E1142 Type 2 diabetes mellitus with diabetic polyneuropathy: Secondary | ICD-10-CM | POA: Diagnosis not present

## 2018-03-25 DIAGNOSIS — J9601 Acute respiratory failure with hypoxia: Secondary | ICD-10-CM | POA: Diagnosis present

## 2018-03-25 DIAGNOSIS — H518 Other specified disorders of binocular movement: Secondary | ICD-10-CM | POA: Diagnosis present

## 2018-03-25 DIAGNOSIS — Z794 Long term (current) use of insulin: Secondary | ICD-10-CM

## 2018-03-25 DIAGNOSIS — I5189 Other ill-defined heart diseases: Secondary | ICD-10-CM | POA: Diagnosis not present

## 2018-03-25 DIAGNOSIS — J9690 Respiratory failure, unspecified, unspecified whether with hypoxia or hypercapnia: Secondary | ICD-10-CM

## 2018-03-25 DIAGNOSIS — E1169 Type 2 diabetes mellitus with other specified complication: Secondary | ICD-10-CM | POA: Diagnosis not present

## 2018-03-25 DIAGNOSIS — R0981 Nasal congestion: Secondary | ICD-10-CM | POA: Diagnosis not present

## 2018-03-25 DIAGNOSIS — E669 Obesity, unspecified: Secondary | ICD-10-CM | POA: Diagnosis present

## 2018-03-25 DIAGNOSIS — B379 Candidiasis, unspecified: Secondary | ICD-10-CM | POA: Diagnosis not present

## 2018-03-25 DIAGNOSIS — I6930 Unspecified sequelae of cerebral infarction: Secondary | ICD-10-CM | POA: Diagnosis not present

## 2018-03-25 DIAGNOSIS — Z888 Allergy status to other drugs, medicaments and biological substances status: Secondary | ICD-10-CM

## 2018-03-25 LAB — CBC WITH DIFFERENTIAL/PLATELET
Abs Immature Granulocytes: 0.01 10*3/uL (ref 0.00–0.07)
Basophils Absolute: 0 10*3/uL (ref 0.0–0.1)
Basophils Relative: 0 %
EOS ABS: 0.1 10*3/uL (ref 0.0–0.5)
EOS PCT: 1 %
HCT: 36.2 % (ref 36.0–46.0)
Hemoglobin: 11.6 g/dL — ABNORMAL LOW (ref 12.0–15.0)
Immature Granulocytes: 0 %
Lymphocytes Relative: 23 %
Lymphs Abs: 1.9 10*3/uL (ref 0.7–4.0)
MCH: 28 pg (ref 26.0–34.0)
MCHC: 32 g/dL (ref 30.0–36.0)
MCV: 87.2 fL (ref 80.0–100.0)
MONO ABS: 0.4 10*3/uL (ref 0.1–1.0)
Monocytes Relative: 5 %
Neutro Abs: 5.7 10*3/uL (ref 1.7–7.7)
Neutrophils Relative %: 71 %
Platelets: 249 10*3/uL (ref 150–400)
RBC: 4.15 MIL/uL (ref 3.87–5.11)
RDW: 14 % (ref 11.5–15.5)
WBC: 8.2 10*3/uL (ref 4.0–10.5)
nRBC: 0 % (ref 0.0–0.2)

## 2018-03-25 LAB — COMPREHENSIVE METABOLIC PANEL
ALT: 11 U/L (ref 0–44)
ANION GAP: 11 (ref 5–15)
AST: 23 U/L (ref 15–41)
Albumin: 3.5 g/dL (ref 3.5–5.0)
Alkaline Phosphatase: 83 U/L (ref 38–126)
BILIRUBIN TOTAL: 0.9 mg/dL (ref 0.3–1.2)
BUN: 26 mg/dL — ABNORMAL HIGH (ref 6–20)
CO2: 19 mmol/L — ABNORMAL LOW (ref 22–32)
Calcium: 9.1 mg/dL (ref 8.9–10.3)
Chloride: 101 mmol/L (ref 98–111)
Creatinine, Ser: 2.54 mg/dL — ABNORMAL HIGH (ref 0.44–1.00)
GFR calc Af Amer: 25 mL/min — ABNORMAL LOW (ref >60)
GFR, EST NON AFRICAN AMERICAN: 22 mL/min — AB (ref >60)
Glucose, Bld: 506 mg/dL (ref 70–99)
POTASSIUM: 4.1 mmol/L (ref 3.5–5.1)
Sodium: 131 mmol/L — ABNORMAL LOW (ref 135–145)
Total Protein: 7.6 g/dL (ref 6.5–8.1)

## 2018-03-25 LAB — CBG MONITORING, ED: GLUCOSE-CAPILLARY: 443 mg/dL — AB (ref 70–99)

## 2018-03-25 LAB — I-STAT CG4 LACTIC ACID, ED: LACTIC ACID, VENOUS: 2.56 mmol/L — AB (ref 0.5–1.9)

## 2018-03-25 LAB — RAPID URINE DRUG SCREEN, HOSP PERFORMED
Amphetamines: NOT DETECTED
Barbiturates: NOT DETECTED
Benzodiazepines: NOT DETECTED
Cocaine: NOT DETECTED
Opiates: NOT DETECTED
Tetrahydrocannabinol: NOT DETECTED

## 2018-03-25 LAB — I-STAT BETA HCG BLOOD, ED (MC, WL, AP ONLY)

## 2018-03-25 LAB — I-STAT TROPONIN, ED: TROPONIN I, POC: 0.05 ng/mL (ref 0.00–0.08)

## 2018-03-25 MED ORDER — HYDRALAZINE HCL 25 MG PO TABS
100.0000 mg | ORAL_TABLET | Freq: Once | ORAL | Status: DC
  Filled 2018-03-25: qty 4

## 2018-03-25 MED ORDER — HYDRALAZINE HCL 20 MG/ML IJ SOLN
10.0000 mg | Freq: Once | INTRAMUSCULAR | Status: AC
  Administered 2018-03-25: 10 mg via INTRAVENOUS
  Filled 2018-03-25: qty 1

## 2018-03-25 MED ORDER — DOCUSATE SODIUM 50 MG/5ML PO LIQD
100.0000 mg | Freq: Two times a day (BID) | ORAL | Status: DC | PRN
  Administered 2018-03-29: 100 mg
  Filled 2018-03-25: qty 10

## 2018-03-25 MED ORDER — CHLORHEXIDINE GLUCONATE 0.12% ORAL RINSE (MEDLINE KIT)
15.0000 mL | Freq: Two times a day (BID) | OROMUCOSAL | Status: DC
  Administered 2018-03-26 – 2018-03-31 (×12): 15 mL via OROMUCOSAL

## 2018-03-25 MED ORDER — LORAZEPAM 2 MG/ML IJ SOLN
INTRAMUSCULAR | Status: AC
  Filled 2018-03-25: qty 1

## 2018-03-25 MED ORDER — ACETAMINOPHEN 650 MG RE SUPP: 650.0000 mg | RECTAL | Status: DC | PRN

## 2018-03-25 MED ORDER — FUROSEMIDE 20 MG PO TABS
80.0000 mg | ORAL_TABLET | Freq: Once | ORAL | Status: DC
  Filled 2018-03-25: qty 4

## 2018-03-25 MED ORDER — FENTANYL CITRATE (PF) 100 MCG/2ML IJ SOLN
100.0000 ug | INTRAMUSCULAR | Status: DC | PRN
  Administered 2018-03-26: 100 ug via INTRAVENOUS
  Filled 2018-03-25: qty 2

## 2018-03-25 MED ORDER — LORAZEPAM 2 MG/ML IJ SOLN
1.0000 mg | Freq: Once | INTRAMUSCULAR | Status: AC
  Administered 2018-03-25: 1 mg via INTRAVENOUS

## 2018-03-25 MED ORDER — ATORVASTATIN CALCIUM 40 MG PO TABS
40.0000 mg | ORAL_TABLET | Freq: Every day | ORAL | Status: DC
  Administered 2018-03-26 – 2018-03-31 (×6): 40 mg
  Filled 2018-03-25 (×6): qty 1

## 2018-03-25 MED ORDER — PROPOFOL 1000 MG/100ML IV EMUL
INTRAVENOUS | Status: AC
  Filled 2018-03-25: qty 100

## 2018-03-25 MED ORDER — MIDAZOLAM HCL 2 MG/2ML IJ SOLN
2.0000 mg | INTRAMUSCULAR | Status: DC | PRN
  Filled 2018-03-25: qty 2

## 2018-03-25 MED ORDER — HYDRALAZINE HCL 20 MG/ML IJ SOLN: 10.0000 mg | Freq: Once | INTRAMUSCULAR | Status: DC

## 2018-03-25 MED ORDER — ALBUTEROL SULFATE (2.5 MG/3ML) 0.083% IN NEBU: 2.5000 mg | INHALATION_SOLUTION | RESPIRATORY_TRACT | Status: DC | PRN

## 2018-03-25 MED ORDER — PROPOFOL 1000 MG/100ML IV EMUL
0.0000 ug/kg/min | INTRAVENOUS | Status: DC
  Administered 2018-03-25: 25 ug/kg/min via INTRAVENOUS
  Administered 2018-03-26 (×2): 50 ug/kg/min via INTRAVENOUS
  Administered 2018-03-26: 40 ug/kg/min via INTRAVENOUS
  Filled 2018-03-25: qty 200
  Filled 2018-03-25 (×3): qty 100

## 2018-03-25 MED ORDER — CLOPIDOGREL BISULFATE 75 MG PO TABS
75.0000 mg | ORAL_TABLET | Freq: Every day | ORAL | Status: DC
  Administered 2018-03-26 – 2018-04-01 (×7): 75 mg
  Filled 2018-03-25 (×7): qty 1

## 2018-03-25 MED ORDER — CARVEDILOL 12.5 MG PO TABS
25.0000 mg | ORAL_TABLET | Freq: Two times a day (BID) | ORAL | Status: DC
  Administered 2018-03-26 – 2018-03-29 (×6): 25 mg
  Filled 2018-03-25 (×7): qty 2

## 2018-03-25 MED ORDER — ETOMIDATE 2 MG/ML IV SOLN
INTRAVENOUS | Status: AC | PRN
  Administered 2018-03-25: 20 mg via INTRAVENOUS

## 2018-03-25 MED ORDER — IOPAMIDOL (ISOVUE-370) INJECTION 76%
100.0000 mL | Freq: Once | INTRAVENOUS | Status: AC | PRN
  Administered 2018-03-25: 100 mL via INTRAVENOUS

## 2018-03-25 MED ORDER — HYDRALAZINE HCL 50 MG PO TABS
100.0000 mg | ORAL_TABLET | Freq: Three times a day (TID) | ORAL | Status: DC
  Administered 2018-03-26 – 2018-03-29 (×9): 100 mg
  Filled 2018-03-25 (×10): qty 2

## 2018-03-25 MED ORDER — ACETAMINOPHEN 160 MG/5ML PO SOLN: 650.0000 mg | Freq: Four times a day (QID) | ORAL | Status: DC | PRN

## 2018-03-25 MED ORDER — LEVETIRACETAM IN NACL 1000 MG/100ML IV SOLN
1000.0000 mg | Freq: Once | INTRAVENOUS | Status: AC
  Administered 2018-03-25: 1000 mg via INTRAVENOUS
  Filled 2018-03-25: qty 100

## 2018-03-25 MED ORDER — SODIUM CHLORIDE 0.9 % IV BOLUS: 1000.0000 mL | Freq: Once | INTRAVENOUS | Status: DC

## 2018-03-25 MED ORDER — CLOPIDOGREL BISULFATE 75 MG PO TABS: 75.0000 mg | ORAL_TABLET | Freq: Every day | ORAL | Status: DC

## 2018-03-25 MED ORDER — AMLODIPINE BESYLATE 5 MG PO TABS: 10.0000 mg | ORAL_TABLET | Freq: Every day | ORAL | Status: DC

## 2018-03-25 MED ORDER — METOPROLOL TARTRATE 5 MG/5ML IV SOLN
5.0000 mg | Freq: Once | INTRAVENOUS | Status: DC
  Filled 2018-03-25: qty 5

## 2018-03-25 MED ORDER — NICARDIPINE HCL IN NACL 20-0.86 MG/200ML-% IV SOLN
INTRAVENOUS | Status: AC
  Filled 2018-03-25: qty 200

## 2018-03-25 MED ORDER — SODIUM CHLORIDE 0.9 % IV SOLN
INTRAVENOUS | Status: DC
  Administered 2018-03-26 – 2018-03-28 (×4): via INTRAVENOUS

## 2018-03-25 MED ORDER — INSULIN ASPART 100 UNIT/ML ~~LOC~~ SOLN: 2.0000 [IU] | SUBCUTANEOUS | Status: DC

## 2018-03-25 MED ORDER — DOXAZOSIN MESYLATE 4 MG PO TABS
4.0000 mg | ORAL_TABLET | Freq: Every day | ORAL | Status: DC
  Administered 2018-03-26 – 2018-03-27 (×2): 4 mg
  Filled 2018-03-25 (×2): qty 1

## 2018-03-25 MED ORDER — PANTOPRAZOLE SODIUM 40 MG PO PACK
40.0000 mg | PACK | Freq: Every day | ORAL | Status: DC
  Administered 2018-03-26 – 2018-04-01 (×7): 40 mg
  Filled 2018-03-25 (×7): qty 20

## 2018-03-25 MED ORDER — CARVEDILOL 12.5 MG PO TABS: 25.0000 mg | ORAL_TABLET | Freq: Two times a day (BID) | ORAL | Status: DC

## 2018-03-25 MED ORDER — HYDRALAZINE HCL 100 MG PO TABS: 100.0000 mg | ORAL_TABLET | Freq: Three times a day (TID) | ORAL | Status: DC

## 2018-03-25 MED ORDER — SODIUM CHLORIDE 0.9 % IV SOLN
INTRAVENOUS | Status: AC | PRN
  Administered 2018-03-25: 125 mL/h via INTRAVENOUS

## 2018-03-25 MED ORDER — ATORVASTATIN CALCIUM 20 MG PO TABS: 40.0000 mg | ORAL_TABLET | Freq: Every day | ORAL | Status: DC

## 2018-03-25 MED ORDER — HEPARIN SODIUM (PORCINE) 5000 UNIT/ML IJ SOLN
5000.0000 [IU] | Freq: Three times a day (TID) | INTRAMUSCULAR | Status: DC
  Administered 2018-03-26 – 2018-03-29 (×10): 5000 [IU] via SUBCUTANEOUS
  Filled 2018-03-25 (×10): qty 1

## 2018-03-25 MED ORDER — CLEVIDIPINE BUTYRATE 0.5 MG/ML IV EMUL
0.0000 mg/h | INTRAVENOUS | Status: DC
  Administered 2018-03-25: 18 mg/h via INTRAVENOUS
  Administered 2018-03-26 (×2): 21 mg/h via INTRAVENOUS
  Administered 2018-03-26: 13 mg/h via INTRAVENOUS
  Filled 2018-03-25 (×6): qty 50

## 2018-03-25 MED ORDER — DOXAZOSIN MESYLATE 4 MG PO TABS: 4.0000 mg | ORAL_TABLET | Freq: Every day | ORAL | Status: DC

## 2018-03-25 MED ORDER — FENTANYL CITRATE (PF) 100 MCG/2ML IJ SOLN
100.0000 ug | Freq: Once | INTRAMUSCULAR | Status: AC
  Administered 2018-03-25: 100 ug via INTRAVENOUS
  Filled 2018-03-25: qty 2

## 2018-03-25 MED ORDER — ROCURONIUM BROMIDE 50 MG/5ML IV SOLN
INTRAVENOUS | Status: AC | PRN
  Administered 2018-03-25: 70 mg via INTRAVENOUS

## 2018-03-25 MED ORDER — AMLODIPINE BESYLATE 10 MG PO TABS
10.0000 mg | ORAL_TABLET | Freq: Every day | ORAL | Status: DC
  Administered 2018-03-26 – 2018-03-28 (×3): 10 mg
  Filled 2018-03-25 (×3): qty 1

## 2018-03-25 MED ORDER — ORAL CARE MOUTH RINSE
15.0000 mL | OROMUCOSAL | Status: DC
  Administered 2018-03-26 – 2018-04-01 (×60): 15 mL via OROMUCOSAL

## 2018-03-25 MED ORDER — BISACODYL 10 MG RE SUPP: 10.0000 mg | Freq: Every day | RECTAL | Status: DC | PRN

## 2018-03-25 NOTE — ED Notes (Addendum)
EEG active in room, will have to get labs later.

## 2018-03-25 NOTE — ED Triage Notes (Signed)
[id number]

## 2018-03-25 NOTE — Code Documentation (Signed)
MD Kirkpatrick at bedside.

## 2018-03-25 NOTE — Code Documentation (Signed)
MD Leonel Ramsay asked to wait on intubation, would like to assess before.

## 2018-03-25 NOTE — ED Notes (Signed)
Radiology tech came to this RN and states they were unable to obtain CT at this time due to patient having possible seizure. Patient transported back to room. PA made aware.   Patient uncooperative pulling at lines and not wanting to stay in the bed.

## 2018-03-25 NOTE — Progress Notes (Signed)
Patient back form CT. No complications noted.

## 2018-03-25 NOTE — ED Notes (Addendum)
Metoprolol already pulled in syringe, MD said not to give. Wasted in sharps.

## 2018-03-25 NOTE — Consult Note (Addendum)
Neurology Consultation Reason for Consult: AMS Referring Physician: Billy Fischer, E  CC: AMS  History is obtained from:Referring providers.   HPI: Christina Orozco is a 47 y.o. female with DM, HTN, stroke who was last seen well prior to being found down in [place] sometime between 6 and 6:30 PM.  She was brought into the emergency department and described as "postictal" by EMS.  On arrival here, however, she was doing well and essentially back to baseline.  She is being worked up for possible seizure and was brought over for CT scan where she had a recurrent episode.  This was described as left arm extension with shaking as well as right eye deviation.  Following this, she remained persistently altered.  She would not follow commands, she did return to localizing bilaterally though moving her left less than her right. She was not cooperative enough for CT and therefore was intubated.   I accompanied the patient the CT scanner and following plain CT. proceeded with CTA which did not demonstrate LVO.    LKW: 6 PM tpa given?: no, seizure at onset  ROS: Unable to obtain due to altered mental status.   Past Medical History:  Diagnosis Date  . Anemia    severe  . Asthma   . CKD (chronic kidney disease)    stage IV  . DDD (degenerative disc disease), cervical   . Diabetes mellitus ([place])   . Dislocated shoulder    right  . GERD (gastroesophageal reflux disease)   . Hypertension   . Neuropathy   . Stroke [facility])      Family History  Problem Relation Age of Onset  . Vascular Disease Mother   . CAD Mother   . Heart failure Mother   . Heart disease Unknown   . Cancer Unknown        colon, stomach, lung  . Diabetes Unknown   . Seizures Unknown   . Breast cancer Sister 53     Social History:  reports that she has never smoked. She has never used smokeless tobacco. She reports that she does not drink alcohol or use drugs.   Exam: Current vital signs: BP (!) 205/125 (BP  Location: Right Arm)   Pulse 87   Temp 99.1 F (37.3 C) (Oral)   Resp 10   Ht 5' 2.5" (1.588 m)   Wt 90.7 kg   LMP 03/12/2018   SpO2 97%   BMI 36.00 kg/m  Vital signs in last 24 hours: Temp:  [99.1 F (37.3 C)] 99.1 F (37.3 C) (10/14 1842) Pulse Rate:  [87-100] 87 (10/14 1935) Resp:  [10-20] 10 (10/14 1935) BP: (143-219)/(95-126) 205/125 (10/14 2046) SpO2:  [95 %-99 %] 97 % (10/14 1935) Weight:  [90.7 kg] 90.7 kg (10/14 1840)   Physical Exam  Constitutional: Appears well-developed and well-nourished.  Psych: Confused, does not speak Eyes: No scleral injection HENT: No OP obstrucion Head: Normocephalic.  Cardiovascular: Normal rate and regular rhythm.  Respiratory: Effort normal, non-labored breathing GI: Soft.  No distension. There is no tenderness.  Skin: WDI  Neuro: Mental Status: Patient is obtunded, eyes open minimally to noxious stimuli.  She does not follow commands but she does appear to look at the examiner and push me away I am examining her. Cranial Nerves: II: She does not blink to threat pupils are equal, round, and reactive to light.   III,IV, VI: I do see her cross midline to the left, she does not appear to cross midline towards  the right. V: VII: She blinks to eyelid stimulation on the right, none on the left she appears to have some left facial weakness. Motor: She moves the right side spontaneously and to noxious stimulation.  On the left, she does move it purposefully, though it appears to be weaker in both the arm and leg than the right side. Sensory: As described above  Cerebellar: She does not perform   I have reviewed labs in epic and the results pertinent to this consultation are: [place] 2.5, glucose 506  I have reviewed the images obtained: CT head-no acute hemorrhage  Impression: 47 year old female with what is likely new onset seizures in the setting of severe hypertension.  Possibilities include posterior reversible encephalopathy  syndrome (e.g. hypertensive emergency), seizure due to acute infarct, also in consideration would be that the movements that were seen on the left side were actually posturing with brainstem ischemia given that the eyes were looking to the right with left-sided weakness.  Therefore CTA will need to be obtained, despite that she does have a mildly elevated creatinine.  Recommendations: 1) CTA 2) Keppra 1 g x 1 3) BP control with Cleviprex, goal SBP < 180 overnight, then more gradually continue to lower 4) glucose control 5) STAT EEG 6) MRI brain 7) will follow  This patient is critically ill and at significant risk of neurological worsening, death and care requires constant monitoring of vital signs, hemodynamics,respiratory and cardiac monitoring, neurological assessment, discussion with family, other specialists and medical decision making of high complexity. I spent 50 minutes of neurocritical care time  in the care of  this patient.  Roland Rack, MD [facility] [phone]  If 7pm- 7am, please page neurology on call as listed in [place]. 03/25/2018  10:15 PM

## 2018-03-25 NOTE — ED Triage Notes (Signed)
Pt found on floor in store ,unwitnessed. First responers reported Ptg appeared to -be post ectial at store. EMS reported Pt was combative turning transport. Pt CBG 500 per EMS and SBP 200. Pt A/O but tearful on arrival to room.

## 2018-03-25 NOTE — ED Notes (Signed)
Md at bedside

## 2018-03-25 NOTE — H&P (Signed)
NAME:  Christina Orozco, MRN:  [id number], DOB:  June 25, 1970, LOS: 0 ADMISSION DATE:  03/25/2018, CONSULTATION DATE:  03/25/2018 REFERRING MD:  Dr. Billy Fischer, ER CHIEF COMPLAINT:  Seizure   Brief History   47 yo female with multiple seizures in setting of HTN emergency and hyperglycemia.  Intubated for airway protection.   Past Medical History  CVA, HTN, DM, GERD, CKD 2, Asthma, Anemia, Neuropathy  Significant Hospital Events   10/14 admit  Consults: date of consult/date signed off & final recs:  Neurology 10/14 seizure >>  Procedures (surgical and bedside):  ETT 10/14 >>   Significant Diagnostic Tests:  CT head 10/14 >> no acute findings EEG 10/14 >>  Micro Data:    Antimicrobials:     Subjective:    Objective   Blood pressure (!) 187/111, pulse (!) 130, temperature 98.4 F (36.9 C), resp. rate (!) 23, height 5\' 4"  (1.626 m), weight 90.7 kg, last menstrual period 03/12/2018, SpO2 100 %.    Vent Mode: PRVC FiO2 (%):  [60 %] 60 % Set Rate:  [16 bmp] 16 bmp Vt Set:  [430 mL] 430 mL PEEP:  [5 cmH20] 5 cmH20 Plateau Pressure:  [16 cmH20] 16 cmH20  No intake or output data in the 24 hours ending 03/25/18 2255 Filed Weights   03/25/18 1840  Weight: 90.7 kg    Examination:  General - sedated Eyes - pupils reactive ENT - ETT in place Cardiac - regular, tachycardic, no murmur Chest - equal breath sounds b/l, no wheezing or rales Abdomen - soft, non tender, + bowel sounds, no hepatosplenomegaly GU - no lesions noted Extremities - no cyanosis, clubbing, or edema Skin - no rashes Lymphatics - no lymphadenopathy Neuro - chemically paralyzed after intubation  CXR 10/14 (reviewed by me) >> ETT in good position   Assessment & Plan:   Recurrent seizures. Hx of CVA. - f/u EEG - neurology consulted - continue plavix - RASS goal 0 to -1 while on vent  HTN emergency. Hx of HLD. - cleviprex for goal SBP < 180 - resume home antihypertensive  medications  Acute respiratory failure with hypoxia from compromised airway. Hx of asthma. - full vent support until neuro status stable - f/u CXR, ABG - prn albuterol  Hyperglycemia with hx of DM. - SSI  Acute renal failure likely from volume depletion. CKD 2. - NS IV fluid at 75 ml/hr - f/u BMET, monitor urine outpt  Disposition / Summary of Today's Plan 03/25/18   Admit to ICU     Diet: NPO DVT prophylaxis: SQ heparin GI prophylaxis: Protonix Mobility: Bed rest Code Status: full code Family Communication: no family at bedside  Labs   CBC: Recent Labs  Lab 03/25/18 1916  WBC 8.2  NEUTROABS 5.7  HGB 11.6*  HCT 36.2  MCV 87.2  PLT 409    Basic Metabolic Panel: Recent Labs  Lab 03/25/18 1916  NA 131*  K 4.1  CL 101  CO2 19*  GLUCOSE 506*  BUN 26*  CREATININE 2.54*  CALCIUM 9.1   GFR: Estimated Creatinine Clearance: 30.8 mL/min (A) (by C-G formula based on SCr of 2.54 mg/dL (H)). Recent Labs  Lab 03/25/18 1916  WBC 8.2    Liver Function Tests: Recent Labs  Lab 03/25/18 1916  AST 23  ALT 11  ALKPHOS 83  BILITOT 0.9  PROT 7.6  ALBUMIN 3.5   No results for input(s): LIPASE, AMYLASE in the last 168 hours. No results for input(s): AMMONIA in the last  168 hours.  ABG    Component Value Date/Time   TCO2 28 06/29/2017 1815     Coagulation Profile: No results for input(s): INR, PROTIME in the last 168 hours.  Cardiac Enzymes: No results for input(s): CKTOTAL, CKMB, CKMBINDEX, TROPONINI in the last 168 hours.  HbA1C: Hemoglobin A1C  Date/Time Value Ref Range Status  01/11/2018 03:17 PM 10.3 (A) 4.0 - 5.6 % Final  11/09/2017 03:37 PM 8.3 (A) 4.0 - 5.6 % Final   Hgb A1c MFr Bld  Date/Time Value Ref Range Status  07/26/2017 11:18 AM 12.5 (H) 4.8 - 5.6 % Final    Comment:    (NOTE) Pre diabetes:          5.7%-6.4% Diabetes:              >6.4% Glycemic control for   <7.0% adults with diabetes   12/29/2016 02:44 PM 11.3 (H) <5.7 %  Final    Comment:      For someone without known diabetes, a hemoglobin A1c value of 6.5% or greater indicates that they may have diabetes and this should be confirmed with a follow-up test.   For someone with known diabetes, a value <7% indicates that their diabetes is well controlled and a value greater than or equal to 7% indicates suboptimal control. A1c targets should be individualized based on duration of diabetes, age, comorbid conditions, and other considerations.   Currently, no consensus exists for use of hemoglobin A1c for diagnosis of diabetes for children.       CBG: Recent Labs  Lab 03/25/18 1905  GLUCAP 443*    Admitting History of Present Illness.   Hx from chart and ER staff.  47 yo female at [employer] and was found laying on the floor.  EMS reports she appeared to be post ictal.  Has hx of CVA.  BP was very elevated in ER (261/130).  While getting CT head she had recurrent seizure.  She was not able to cooperate with getting CT head.  She was intubated for airway protection.  Started on cleviprex.  Neurology consulted.  She was noted to have elevated blood sugar also.  CT head was negative for acute findings.  UDS was negative.  Review of Systems:   Unable to obtain.  Past Medical History  She,  has a past medical history of Anemia, Asthma, CKD (chronic kidney disease), DDD (degenerative disc disease), cervical, Diabetes mellitus ([place]), Dislocated shoulder, GERD (gastroesophageal reflux disease), Hypertension, Neuropathy, and Stroke ([place]).   Surgical History    Past Surgical History:  Procedure Laterality Date  . ADENOIDECTOMY    . APPENDECTOMY    . CERVICAL SPINE SURGERY  06/2012   C5-C6  . EYE SURGERY     left eye surgery   . FOOT SURGERY    . LOOP RECORDER INSERTION N/A 08/27/2017   Procedure: LOOP RECORDER INSERTION;  Surgeon: Sanda Klein, MD;  Location: [facility];  Service: Cardiovascular;  Laterality: N/A;  . SHOULDER SURGERY     . TEE WITHOUT CARDIOVERSION N/A 08/27/2017   Procedure: TRANSESOPHAGEAL ECHOCARDIOGRAM (TEE);  Surgeon: Sanda Klein, MD;  Location: [facility] ENDOSCOPY;  Service: Cardiovascular;  Laterality: N/A;  . TONSILLECTOMY       Social History  She  reports that she has never smoked. She has never used smokeless tobacco. She reports that she does not drink alcohol or use drugs.   Family History   Her family history includes Breast cancer (age of onset: 3) in  her sister; CAD in her mother; Cancer in her unknown relative; Diabetes in her unknown relative; Heart disease in her unknown relative; Heart failure in her mother; Seizures in her unknown relative; Vascular Disease in her mother.   Allergies Allergies  Allergen Reactions  . Lisinopril Swelling and Other (See Comments)    Facial swelling   . Penicillins Hives    Has patient had a PCN reaction causing immediate rash, facial/tongue/throat swelling, SOB or lightheadedness with hypotension: yes Has patient had a PCN reaction causing severe rash involving mucus membranes or skin necrosis: No Has patient had a PCN reaction that required hospitalization No Has patient had a PCN reaction occurring within the last 10 years: No If all of the above answers are "NO", then may proceed wit  . Gabapentin Swelling and Other (See Comments)    "I thought I was having another stroke."      Home Medications  Prior to Admission medications   Medication Sig Start Date End Date Taking? Authorizing Provider  amLODipine (NORVASC) 10 MG tablet Take 1 tablet (10 mg total) by mouth daily. 02/01/18 05/02/18  Skeet Latch, MD  atorvastatin (LIPITOR) 40 MG tablet TAKE 1 TABLET BY MOUTH EVERY EVENING AT 6PM 10/15/17   Dorena Dew, FNP  carvedilol (COREG) 25 MG tablet Take 1 tablet (25 mg total) by mouth 2 (two) times daily with a meal. Take 25 mg twice daily Patient taking differently: Take 25 mg by mouth 2 (two) times daily with a meal.  01/09/17   Skeet Latch, MD  clopidogrel (PLAVIX) 75 MG tablet Take 1 tablet (75 mg total) by mouth daily. 02/01/18   Skeet Latch, MD  doxazosin (CARDURA) 4 MG tablet Take 1 tablet (4 mg total) by mouth daily. 07/19/17   Skeet Latch, MD  ferrous sulfate 325 (65 FE) MG tablet Take 1 tablet (325 mg total) by mouth daily. 08/06/17 08/06/18  Dorena Dew, FNP  furosemide (LASIX) 80 MG tablet Take 1 tablet (80 mg total) by mouth 2 (two) times daily. 02/01/18   Skeet Latch, MD  glucose blood (CONTOUR NEXT TEST) test strip 1 each by Other route 2 (two) times daily. And lancets 2/day 09/10/17   Renato Shin, MD  hydrALAZINE (APRESOLINE) 100 MG tablet Take 1 tablet (100 mg total) by mouth 3 (three) times daily. 02/01/18 05/02/18  Skeet Latch, MD  Insulin Degludec (TRESIBA FLEXTOUCH) 200 UNIT/ML SOPN Inject 60 Units into the skin daily. And pen needles 1/day 01/11/18   Renato Shin, MD  meclizine (ANTIVERT) 25 MG tablet TAKE 1 TABLET(25 MG) BY MOUTH EVERY 8 HOURS AS NEEDED FOR DIZZINESS 12/17/17   Tresa Garter, MD  PROAIR HFA [phone] Base) MCG/ACT inhaler INHALE 1 OR 2 PUFFS INTO THE LUNGS EVERY 6 HOURS AS NEEDED FOR WHEEZING OR SHORTNESS OF BREATH 09/24/17   Dorena Dew, FNP     Critical care time: 39 minutes    Chesley Mires, MD [place] 03/25/2018, 11:04 PM

## 2018-03-25 NOTE — ED Notes (Signed)
Will transport patient once EEG is complete.

## 2018-03-25 NOTE — Code Documentation (Signed)
Time out performed at 2052. Verified correct patient and procedure.

## 2018-03-25 NOTE — Code Documentation (Signed)
MD Leonel Ramsay said to proceed with intubation.

## 2018-03-25 NOTE — Progress Notes (Signed)
Stat bedside EEG completed, results pending.  Dr Leonel Ramsay aware.

## 2018-03-25 NOTE — ED Provider Notes (Signed)
[facility] EMERGENCY DEPARTMENT Provider Note   CSN: [id number] Arrival date & time: 03/25/18  1826     History   Chief Complaint Chief Complaint  Patient presents with  . Seizures    HPI Christina Orozco is a 47 y.o. female.  HPI  Christina Orozco is a 47yo female with a history of hypertension, hyperlipidemia, insulin-dependent diabetes, CKD stage IV, prior CVA, asthma who presents to the emergency department via EMS after being found down at [facility] earlier today.  Patient states that she drove to [employer] and felt a little bit tired on the way there.  She remembers walking to the store but does not remember anything else until being inside an ambulance.  According to EMS report, patient appeared to be confused and post ictal at the store. She was combative during transport. Patient states that other than feeling a little bit "shaky" she feels at baseline. She denies feeling light headed, dizzy, chest pain or shortness of breath. She denies biting her tongue or loss of continence. She has never had a seizure before. No recent illness with fever, chills, cough, congestion, vomiting. No sleep deprivation. She denies recent drug or alcohol use. She has an uncle who had a "seizure problem" but no other history of seizures in her family. States that she had a headache earlier in the week but denies this now. She states that she is taking her anti-hypertensives, but her doctor recently increased her dose and she does not take the increased dose because it makes her feel woozy. She denies focal numbness, weakness, visual disturbance, nausea/vomiting, headache, neck pain.   Past Medical History:  Diagnosis Date  . Anemia    severe  . Asthma   . CKD (chronic kidney disease)    stage IV  . DDD (degenerative disc disease), cervical   . Diabetes mellitus ([place])   . Dislocated shoulder    right  . GERD (gastroesophageal reflux disease)   . Hypertension   .  Neuropathy   . Stroke [facility])     Patient Active Problem List   Diagnosis Date Noted  . Diabetes ([place]) 09/28/2017  . Anemia of chronic disease 08/09/2017  . Hyperlipidemia 07/26/2017  . 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[place]) 07/25/2017  . Anxiety as acute reaction to exceptional stress 04/23/2017  . Insomnia 10/08/2016  . Bacterial vaginitis 10/08/2016  . Neuropathy 06/07/2016  . Gait instability   . Lesion of pons   . Slurred speech   . Dizziness 04/30/2016  . Hyperglycemia 04/30/2016  . Chronic kidney disease (CKD), stage III (moderate) ([place]) 04/30/2016  . Ataxia 04/30/2016  . Chronic right shoulder pain 04/03/2016  . 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 Rhinitis, allergic 08/25/2015  . Asthma, mild intermittent 07/14/2015  . Gastroesophageal reflux disease without esophagitis 06/08/2015  . Alopecia 06/08/2015  . Hypertensive cardiovascular disease 05/05/2015  . Vitamin D deficiency 05/05/2015    Past Surgical History:  Procedure Laterality Date  . ADENOIDECTOMY    . APPENDECTOMY    . CERVICAL SPINE SURGERY  06/2012   C5-C6  . EYE SURGERY     left eye surgery   . FOOT SURGERY    . LOOP RECORDER INSERTION N/A 08/27/2017   Procedure: LOOP RECORDER INSERTION;  Surgeon: Sanda Klein, MD;  Location: [facility] CV LAB;  Service: Cardiovascular;  Laterality: N/A;  . SHOULDER SURGERY    . TEE WITHOUT CARDIOVERSION N/A 08/27/2017   Procedure: TRANSESOPHAGEAL ECHOCARDIOGRAM (TEE);  Surgeon: Sanda Klein, MD;  Location: [facility];  Service: Cardiovascular;  Laterality: N/A;  . TONSILLECTOMY       OB History   None      Home Medications    Prior to Admission medications   Medication Sig Start Date End Date Taking? Authorizing Provider  amLODipine (NORVASC) 10 MG tablet Take 1 tablet (10 mg total) by mouth daily. 02/01/18 05/02/18  Skeet Latch, MD  atorvastatin (LIPITOR) 40 MG tablet TAKE 1 TABLET BY MOUTH EVERY EVENING AT 6PM 10/15/17   Dorena Dew, FNP    carvedilol (COREG) 25 MG tablet Take 1 tablet (25 mg total) by mouth 2 (two) times daily with a meal. Take 25 mg twice daily Patient taking differently: Take 25 mg by mouth 2 (two) times daily with a meal.  01/09/17   Skeet Latch, MD  clopidogrel (PLAVIX) 75 MG tablet Take 1 tablet (75 mg total) by mouth daily. 02/01/18   Skeet Latch, MD  doxazosin (CARDURA) 4 MG tablet Take 1 tablet (4 mg total) by mouth daily. 07/19/17   Skeet Latch, MD  ferrous sulfate 325 (65 FE) MG tablet Take 1 tablet (325 mg total) by mouth daily. 08/06/17 08/06/18  Dorena Dew, FNP  furosemide (LASIX) 80 MG tablet Take 1 tablet (80 mg total) by mouth 2 (two) times daily. 02/01/18   Skeet Latch, MD  glucose blood (CONTOUR NEXT TEST) test strip 1 each by Other route 2 (two) times daily. And lancets 2/day 09/10/17   Renato Shin, MD  hydrALAZINE (APRESOLINE) 100 MG tablet Take 1 tablet (100 mg total) by mouth 3 (three) times daily. 02/01/18 05/02/18  Skeet Latch, MD  Insulin Degludec (TRESIBA FLEXTOUCH) 200 UNIT/ML SOPN Inject 60 Units into the skin daily. And pen needles 1/day 01/11/18   Renato Shin, MD  meclizine (ANTIVERT) 25 MG tablet TAKE 1 TABLET(25 MG) BY MOUTH EVERY 8 HOURS AS NEEDED FOR DIZZINESS 12/17/17   Tresa Garter, MD  PROAIR HFA 108 [phone] Base) MCG/ACT inhaler INHALE 1 OR 2 PUFFS INTO THE LUNGS EVERY 6 HOURS AS NEEDED FOR WHEEZING OR SHORTNESS OF BREATH 09/24/17   Dorena Dew, FNP    Family History Family History  Problem Relation Age of Onset  . Vascular Disease Mother   . CAD Mother   . Heart failure Mother   . Heart disease Unknown   . Cancer Unknown        colon, stomach, lung  . Diabetes Unknown   . Seizures Unknown   . Breast cancer Sister 55    Social History Social History   Tobacco Use  . Smoking status: Never Smoker  . Smokeless tobacco: Never Used  Substance Use Topics  . Alcohol use: No  . Drug use: No     Allergies   Lisinopril;  Penicillins; and Gabapentin   Review of Systems Review of Systems  Constitutional: Negative for chills, fatigue and fever.  HENT: Negative for congestion and sore throat.   Eyes: Negative for visual disturbance.  Respiratory: Negative for cough and shortness of breath.   Cardiovascular: Negative for chest pain.  Gastrointestinal: Negative for abdominal pain, nausea and vomiting.  Genitourinary: Negative for difficulty urinating and dysuria.  Musculoskeletal: Negative for back pain, gait problem and neck pain.  Skin: Negative for rash.  Neurological: Negative for dizziness, light-headedness and headaches.       Loss of consciousness, unwitnessed  Psychiatric/Behavioral: Negative for agitation and confusion.     Physical Exam Updated Vital Signs BP (!) 219/95 (BP Location:  Left Arm)   Pulse 100   Temp 99.1 F (37.3 C) (Oral)   Resp 20   Ht 5' 2.5" (1.588 m)   Wt 90.7 kg   LMP 03/12/2018   SpO2 99%   BMI 36.00 kg/m   Physical Exam  Constitutional: She appears well-developed and well-nourished. No distress.  No acute distress, sitting up. Conversational.   HENT:  Head: Normocephalic and atraumatic.  Mouth/Throat: Oropharynx is clear and moist.  No scalp laceration, abrasion or hematoma. Lipoma over the forehead which is non-tender.   Eyes: Pupils are equal, round, and reactive to light. Conjunctivae and EOM are normal. Right eye exhibits no discharge. Left eye exhibits no discharge.  Neck: Normal range of motion. Neck supple.  Tender to palpation over C7.   Cardiovascular: Normal rate and regular rhythm.  Pulmonary/Chest: Effort normal and breath sounds normal. No stridor. No respiratory distress. She has no wheezes. She has no rales.  Abdominal: Soft. Bowel sounds are normal. There is no tenderness.  Musculoskeletal: Normal range of motion.  Bilateral 1+ pitting edema to the mid-shin.  Neurological: She is alert. Coordination normal.  Mental Status:  Alert, oriented to  person, place, situation and time, thought content appropriate, able to give a coherent history. Speech fluent without evidence of aphasia. Able to follow 2 step commands without difficulty.  Cranial Nerves:  II:  Peripheral visual fields grossly normal, pupils equal, round, reactive to light III,IV, VI: ptosis not present, extra-ocular motions intact bilaterally  V,VII: smile symmetric, facial light touch sensation equal VIII: hearing grossly normal to voice  X: uvula elevates symmetrically  XI: bilateral shoulder shrug symmetric and strong XII: midline tongue extension without fassiculations Motor:  Normal tone. 5/5 in upper and lower extremities bilaterally including strong and equal grip strength and dorsiflexion/plantar flexion Sensory: Light touch normal in all extremities.  Cerebellar: normal finger-to-nose with bilateral upper extremities CV: distal pulses palpable throughout   Skin: Skin is warm and dry. She is not diaphoretic.  Psychiatric: She has a normal mood and affect. Her behavior is normal.  Nursing note and vitals reviewed.    ED Treatments / Results  Labs (all labs ordered are listed, but only abnormal results are displayed) Labs Reviewed  CBC WITH DIFFERENTIAL/PLATELET - Abnormal; Notable for the following components:      Result Value   Hemoglobin 11.6 (*)    All other components within normal limits  COMPREHENSIVE METABOLIC PANEL - Abnormal; Notable for the following components:   Sodium 131 (*)    CO2 19 (*)    Glucose, Bld 506 (*)    BUN 26 (*)    Creatinine, Ser 2.54 (*)    GFR calc non Af Amer 22 (*)    GFR calc Af Amer 25 (*)    All other components within normal limits  CBG MONITORING, ED - Abnormal; Notable for the following components:   Glucose-Capillary 443 (*)    All other components within normal limits  RAPID URINE DRUG SCREEN, HOSP PERFORMED  I-STAT BETA HCG BLOOD, ED ([facility], [facility], [facility] ONLY)  I-STAT CG4 LACTIC ACID, ED  I-STAT TROPONIN, ED    I-STAT CG4 LACTIC ACID, ED    EKG None  Radiology Ct Head Wo Contrast  Result Date: 03/25/2018 CLINICAL DATA:  Un witnessed loss of consciousness. Found down. Midline cervical spine tenderness. EXAM: CT HEAD WITHOUT CONTRAST CT CERVICAL SPINE WITHOUT CONTRAST TECHNIQUE: Multidetector CT imaging of the head and cervical spine was performed following the standard protocol without  intravenous contrast. Multiplanar CT image reconstructions of the cervical spine were also generated. COMPARISON:  11/01/2017 FINDINGS: CT HEAD FINDINGS Brain: No evidence of acute infarction, hemorrhage, hydrocephalus, extra-axial collection or mass lesion/mass effect. Vascular: No hyperdense vessel or unexpected calcification. Skull: Normal. Negative for fracture or focal lesion. Sinuses/Orbits: Minimal mucosal disease in the sphenoid sinuses. Other: None. CT CERVICAL SPINE FINDINGS Alignment: Straightening of cervical spine. Skull base and vertebrae: Negative for an acute fracture or dislocation. Anterior plate and screw fixation at C5-C6 with interbody device. Striated pattern in the T1 vertebral body probably represents a vertebral body hemangioma. Negative for an acute fracture. Soft tissues and spinal canal: There is endotracheal tube present. Prominent soft tissue in the nasopharynx likely related to the endotracheal tube. No evidence for spinal canal hematoma. Disc levels: Surgical fusion at C5-C6. The other disc space levels are maintained. Upper chest: Negative Other: None IMPRESSION: 1. No acute intracranial abnormality. 2. No acute abnormality in the cervical spine. 3. Surgical fusion at S9-H7 without complicating features. 4. Extensive soft tissue swelling in the nasopharynx and probably associated with endotracheal tube. Electronically Signed   By: Markus Daft M.D.   On: 03/25/2018 21:51   Ct Cervical Spine Wo Contrast  Result Date: 03/25/2018 CLINICAL DATA:  Un witnessed loss of consciousness. Found down.  Midline cervical spine tenderness. EXAM: CT HEAD WITHOUT CONTRAST CT CERVICAL SPINE WITHOUT CONTRAST TECHNIQUE: Multidetector CT imaging of the head and cervical spine was performed following the standard protocol without intravenous contrast. Multiplanar CT image reconstructions of the cervical spine were also generated. COMPARISON:  11/01/2017 FINDINGS: CT HEAD FINDINGS Brain: No evidence of acute infarction, hemorrhage, hydrocephalus, extra-axial collection or mass lesion/mass effect. Vascular: No hyperdense vessel or unexpected calcification. Skull: Normal. Negative for fracture or focal lesion. Sinuses/Orbits: Minimal mucosal disease in the sphenoid sinuses. Other: None. CT CERVICAL SPINE FINDINGS Alignment: Straightening of cervical spine. Skull base and vertebrae: Negative for an acute fracture or dislocation. Anterior plate and screw fixation at C5-C6 with interbody device. Striated pattern in the T1 vertebral body probably represents a vertebral body hemangioma. Negative for an acute fracture. Soft tissues and spinal canal: There is endotracheal tube present. Prominent soft tissue in the nasopharynx likely related to the endotracheal tube. No evidence for spinal canal hematoma. Disc levels: Surgical fusion at C5-C6. The other disc space levels are maintained. Upper chest: Negative Other: None IMPRESSION: 1. No acute intracranial abnormality. 2. No acute abnormality in the cervical spine. 3. Surgical fusion at D4-K8 without complicating features. 4. Extensive soft tissue swelling in the nasopharynx and probably associated with endotracheal tube. Electronically Signed   By: Markus Daft M.D.   On: 03/25/2018 21:51   Dg Chest Portable 1 View  Result Date: 03/25/2018 CLINICAL DATA:  Intubated. EXAM: PORTABLE CHEST 1 VIEW COMPARISON:  11/15/2016 FINDINGS: Endotracheal tube tip is 2 cm above the carina. There is a tube overlying the hypopharynx region which could be a and unsuccessfully placed nasogastric or  orogastric tube or could be external to the patient. Artifact overlies the chest. Heart and mediastinal shadows are normal. Right lung is clear. There may be mild volume loss in the left lower lobe. No acute bone finding. IMPRESSION: Endotracheal tube tip 2 cm above the carina. Question some volume loss in the left lower lobe. Tube overlying the hypopharynx region could be external to the patient or could represent a nasogastric tube unsuccessfully placed. Electronically Signed   By: Nelson Chimes M.D.   On: 03/25/2018 21:24  Procedures Procedures (including critical care time)  CRITICAL CARE Performed by: Glyn Ade   Total critical care time: 60 minutes  Critical care time was exclusive of separately billable procedures and treating other patients.  Critical care was necessary to treat or prevent imminent or life-threatening deterioration.  Critical care was time spent personally by me on the following activities: development of treatment plan with patient and/or surrogate as well as nursing, discussions with consultants, evaluation of patient's response to treatment, examination of patient, obtaining history from patient or surrogate, ordering and performing treatments and interventions, ordering and review of laboratory studies, ordering and review of radiographic studies, pulse oximetry and re-evaluation of patient's condition.   Medications Ordered in ED Medications  LORazepam (ATIVAN) 2 MG/ML injection (has no administration in time range)  propofol (DIPRIVAN) 1000 MG/100ML infusion (  Incomplete 03/25/18 2115)  niCARdipine in saline (CARDENE-IV) 20-0.86 MG/200ML-% infusion SOLN (has no administration in time range)  clevidipine (CLEVIPREX) infusion 0.5 mg/mL (has no administration in time range)  LORazepam (ATIVAN) injection 1 mg (1 mg Intravenous Given 03/25/18 1957)  hydrALAZINE (APRESOLINE) injection 10 mg (10 mg Intravenous Given 03/25/18 2033)  metoprolol tartrate  (LOPRESSOR) injection 5 mg (5 mg Intravenous Given 03/25/18 2033)  LORazepam (ATIVAN) injection 1 mg (1 mg Intravenous Given 03/25/18 2004)  levETIRAcetam (KEPPRA) IVPB 1000 mg/100 mL premix (0 mg Intravenous Stopped 03/25/18 2150)  iopamidol (ISOVUE-370) 76 % injection 100 mL (100 mLs Intravenous Contrast Given 03/25/18 2201)     Initial Impression / Assessment and Plan / ED Course  I have reviewed the triage vital signs and the nursing notes.  Pertinent labs & imaging results that were available during my care of the patient were reviewed by me and considered in my medical decision making (see chart for details).     Patient presents after being found down at [employer], according to EMS she was combative and post-ictal. No prior seizure history. She does have history of HTN and T2DM.   On arrival, she is hypertensive with blood pressure 219/95 and CBG 443. Labs pending, patient sent for CT head/neck scan for possible first seizure.   CT staff informed me that patient became unresponsive and was posturing her left arm with thrashing movements of her bilateral arms. She did not get the CT scan and came back to the room. On my evaluation patient is non-verbal, but looks at me when I talk to her. Squeezes my fingers on command. Likely post-ictal after second seizure. 2mg  IV ativan and Keppra ordered. Labs reviewed. CBC unremarkable. Rapid UDS negative. CMP reveals elevated creatinine which appears to be patient's baseline.  I was informed by nursing staff that patient was becoming more confused, rolling in the bed and not following commands. On entry, patient is on her belly, does not look at me and appears to be posturing with her left arm. Dr. Billy Fischer evaluated the patient at this point. Concern for potential hypertensive brain bleed vs PRESS. Plan to intubate patient given her encephalopathy and need for CT scan of the head. Neurologist Dr. Leonel Ramsay was informed and saw the patient  prior to intubation. Patient continues to be hypertensive 205/125, hydralazine IV given prior to intubation.    Patient intubated by Dr. Billy Fischer. CT head negative for ICH. CT angio neck and head negative for acute abnormality. Spoke with Dr. Leonel Ramsay who is concerned for hypertensive emergency with PRESS. Plan to start cleviprex with goal SBP 180. Will likely need EEG and MR Brain. This patient  will be admitted by critical care.   Final Clinical Impressions(s) / ED Diagnoses   Final diagnoses:  Seizure-like activity [facility])    ED Discharge Orders    None       Bernarda Caffey 03/25/18 2244    Gareth Morgan, MD 03/26/18 2231

## 2018-03-25 NOTE — Telephone Encounter (Signed)
Spoke w/ pt and requested that she send a manual transmission b/c she home monitor has not updated in at least 14 days.

## 2018-03-26 ENCOUNTER — Inpatient Hospital Stay (HOSPITAL_COMMUNITY): Payer: BLUE CROSS/BLUE SHIELD

## 2018-03-26 DIAGNOSIS — J9600 Acute respiratory failure, unspecified whether with hypoxia or hypercapnia: Secondary | ICD-10-CM

## 2018-03-26 DIAGNOSIS — I169 Hypertensive crisis, unspecified: Secondary | ICD-10-CM

## 2018-03-26 LAB — GLUCOSE, CAPILLARY
GLUCOSE-CAPILLARY: 120 mg/dL — AB (ref 70–99)
GLUCOSE-CAPILLARY: 139 mg/dL — AB (ref 70–99)
GLUCOSE-CAPILLARY: 151 mg/dL — AB (ref 70–99)
GLUCOSE-CAPILLARY: 154 mg/dL — AB (ref 70–99)
GLUCOSE-CAPILLARY: 301 mg/dL — AB (ref 70–99)
GLUCOSE-CAPILLARY: 424 mg/dL — AB (ref 70–99)
GLUCOSE-CAPILLARY: 488 mg/dL — AB (ref 70–99)
Glucose-Capillary: 587 mg/dL (ref 70–99)
Glucose-Capillary: >600 mg/dL (ref 70–99)
Glucose-Capillary: 226 mg/dL — ABNORMAL HIGH (ref 70–99)
Glucose-Capillary: 160 mg/dL — ABNORMAL HIGH (ref 70–99)
Glucose-Capillary: 159 mg/dL — ABNORMAL HIGH (ref 70–99)
Glucose-Capillary: 159 mg/dL — ABNORMAL HIGH (ref 70–99)
Glucose-Capillary: 225 mg/dL — ABNORMAL HIGH (ref 70–99)
Glucose-Capillary: 202 mg/dL — ABNORMAL HIGH (ref 70–99)
Glucose-Capillary: 87 mg/dL (ref 70–99)
Glucose-Capillary: 88 mg/dL (ref 70–99)
Glucose-Capillary: 112 mg/dL — ABNORMAL HIGH (ref 70–99)
Glucose-Capillary: 147 mg/dL — ABNORMAL HIGH (ref 70–99)
Glucose-Capillary: 137 mg/dL — ABNORMAL HIGH (ref 70–99)

## 2018-03-26 LAB — POCT I-STAT 3, ART BLOOD GAS (G3+)
ACID-BASE DEFICIT: 6 mmol/L — AB (ref 0.0–2.0)
Acid-base deficit: 7 mmol/L — ABNORMAL HIGH (ref 0.0–2.0)
BICARBONATE: 18.5 mmol/L — AB (ref 20.0–28.0)
Bicarbonate: 18.8 mmol/L — ABNORMAL LOW (ref 20.0–28.0)
O2 SAT: 77 %
O2 Saturation: 100 %
PCO2 ART: 36.3 mmHg (ref 32.0–48.0)
PCO2 ART: 36.6 mmHg (ref 32.0–48.0)
PH ART: 7.326 — AB (ref 7.350–7.450)
PO2 ART: 47 mmHg — AB (ref 83.0–108.0)
Patient temperature: 38.1
Patient temperature: 37.8
TCO2: 20 mmol/L — AB (ref 22–32)
TCO2: 20 mmol/L — AB (ref 22–32)
pH, Arterial: 7.317 — ABNORMAL LOW (ref 7.350–7.450)
pO2, Arterial: 198 mmHg — ABNORMAL HIGH (ref 83.0–108.0)

## 2018-03-26 LAB — CSF CELL COUNT WITH DIFFERENTIAL
EOS CSF: 0 % (ref 0–1)
LYMPHS CSF: 1 % — AB (ref 40–80)
MONOCYTE-MACROPHAGE-SPINAL FLUID: 1 % — AB (ref 15–45)
RBC COUNT CSF: 0 /mm3
Segmented Neutrophils-CSF: 98 % — ABNORMAL HIGH (ref 0–6)
TUBE #: 3
WBC, CSF: 30 /mm3 (ref 0–5)

## 2018-03-26 LAB — BASIC METABOLIC PANEL
Anion gap: 14 (ref 5–15)
BUN: 31 mg/dL — ABNORMAL HIGH (ref 6–20)
CO2: 15 mmol/L — AB (ref 22–32)
Calcium: 9.1 mg/dL (ref 8.9–10.3)
Chloride: 103 mmol/L (ref 98–111)
Creatinine, Ser: 3.02 mg/dL — ABNORMAL HIGH (ref 0.44–1.00)
GFR calc Af Amer: 21 mL/min — ABNORMAL LOW (ref >60)
GFR calc non Af Amer: 18 mL/min — ABNORMAL LOW (ref >60)
Glucose, Bld: 457 mg/dL — ABNORMAL HIGH (ref 70–99)
Potassium: 4.1 mmol/L (ref 3.5–5.1)
Sodium: 132 mmol/L — ABNORMAL LOW (ref 135–145)

## 2018-03-26 LAB — CBC
HEMATOCRIT: 36.3 % (ref 36.0–46.0)
Hemoglobin: 12.2 g/dL (ref 12.0–15.0)
MCH: 29.3 pg (ref 26.0–34.0)
MCHC: 33.6 g/dL (ref 30.0–36.0)
MCV: 87.1 fL (ref 80.0–100.0)
NRBC: 0 % (ref 0.0–0.2)
Platelets: 287 10*3/uL (ref 150–400)
RBC: 4.17 MIL/uL (ref 3.87–5.11)
RDW: 14 % (ref 11.5–15.5)
WBC: 16.9 10*3/uL — AB (ref 4.0–10.5)

## 2018-03-26 LAB — BASIC METABOLIC PANEL WITH GFR
Anion gap: 11 (ref 5–15)
BUN: 34 mg/dL — ABNORMAL HIGH (ref 6–20)
CO2: 19 mmol/L — ABNORMAL LOW (ref 22–32)
Calcium: 8.6 mg/dL — ABNORMAL LOW (ref 8.9–10.3)
Chloride: 106 mmol/L (ref 98–111)
Creatinine, Ser: 3.5 mg/dL — ABNORMAL HIGH (ref 0.44–1.00)
GFR calc Af Amer: 17 mL/min — ABNORMAL LOW
GFR calc non Af Amer: 15 mL/min — ABNORMAL LOW
Glucose, Bld: 148 mg/dL — ABNORMAL HIGH (ref 70–99)
Potassium: 3.3 mmol/L — ABNORMAL LOW (ref 3.5–5.1)
Sodium: 136 mmol/L (ref 135–145)

## 2018-03-26 LAB — PROTEIN AND GLUCOSE, CSF
GLUCOSE CSF: 221 mg/dL — AB (ref 40–70)
TOTAL PROTEIN, CSF: 57 mg/dL — AB (ref 15–45)

## 2018-03-26 LAB — MAGNESIUM: Magnesium: 2.2 mg/dL (ref 1.7–2.4)

## 2018-03-26 LAB — PHOSPHORUS: PHOSPHORUS: 3.8 mg/dL (ref 2.5–4.6)

## 2018-03-26 LAB — PATHOLOGIST SMEAR REVIEW

## 2018-03-26 LAB — TRIGLYCERIDES: Triglycerides: 999 mg/dL — ABNORMAL HIGH (ref <150)

## 2018-03-26 LAB — MRSA PCR SCREENING: MRSA BY PCR: NEGATIVE

## 2018-03-26 LAB — STREP PNEUMONIAE URINARY ANTIGEN: Strep Pneumo Urinary Antigen: NEGATIVE

## 2018-03-26 MED ORDER — LEVETIRACETAM IN NACL 500 MG/100ML IV SOLN
500.0000 mg | Freq: Two times a day (BID) | INTRAVENOUS | Status: DC
  Administered 2018-03-26 – 2018-03-29 (×7): 500 mg via INTRAVENOUS
  Filled 2018-03-26 (×7): qty 100

## 2018-03-26 MED ORDER — MIDAZOLAM HCL 2 MG/2ML IJ SOLN
1.0000 mg | INTRAMUSCULAR | Status: DC | PRN
  Administered 2018-03-26 – 2018-03-29 (×2): 2 mg via INTRAVENOUS
  Filled 2018-03-26: qty 2

## 2018-03-26 MED ORDER — FENTANYL 2500MCG IN NS 250ML (10MCG/ML) PREMIX INFUSION
0.0000 ug/h | INTRAVENOUS | Status: DC
  Administered 2018-03-26: 25 ug/h via INTRAVENOUS
  Administered 2018-03-27: 125 ug/h via INTRAVENOUS
  Filled 2018-03-26 (×2): qty 250

## 2018-03-26 MED ORDER — DEXTROSE 5 % IV SOLN
10.0000 mg/kg | INTRAVENOUS | Status: DC
  Administered 2018-03-26 – 2018-03-28 (×3): 670 mg via INTRAVENOUS
  Filled 2018-03-26 (×3): qty 13.4

## 2018-03-26 MED ORDER — INSULIN REGULAR(HUMAN) IN NACL 100-0.9 UT/100ML-% IV SOLN
INTRAVENOUS | Status: DC
  Administered 2018-03-26 (×2): 100 [IU] via INTRAVENOUS
  Filled 2018-03-26 (×3): qty 100

## 2018-03-26 MED ORDER — ACETAMINOPHEN 650 MG RE SUPP: 650.0000 mg | RECTAL | Status: DC | PRN

## 2018-03-26 MED ORDER — SODIUM CHLORIDE 0.9 % IV SOLN
1.0000 g | Freq: Two times a day (BID) | INTRAVENOUS | Status: DC
  Administered 2018-03-26 – 2018-03-29 (×7): 1 g via INTRAVENOUS
  Filled 2018-03-26 (×8): qty 1

## 2018-03-26 MED ORDER — VANCOMYCIN HCL IN DEXTROSE 1-5 GM/200ML-% IV SOLN
1000.0000 mg | INTRAVENOUS | Status: DC
  Administered 2018-03-26 – 2018-03-27 (×2): 1000 mg via INTRAVENOUS
  Filled 2018-03-26 (×2): qty 200

## 2018-03-26 MED ORDER — LIDOCAINE HCL (PF) 1 % IJ SOLN
INTRAMUSCULAR | Status: AC
  Administered 2018-03-26: 5 mL
  Filled 2018-03-26: qty 5

## 2018-03-26 MED ORDER — ACETAMINOPHEN 160 MG/5ML PO SOLN
650.0000 mg | Freq: Four times a day (QID) | ORAL | Status: DC | PRN
  Administered 2018-03-26 – 2018-03-31 (×7): 650 mg
  Filled 2018-03-26 (×7): qty 20.3

## 2018-03-26 MED ORDER — INSULIN ASPART 100 UNIT/ML ~~LOC~~ SOLN
1.0000 [IU] | SUBCUTANEOUS | Status: DC
  Administered 2018-03-26: 2 [IU] via SUBCUTANEOUS
  Administered 2018-03-27: 1 [IU] via SUBCUTANEOUS
  Administered 2018-03-27 (×4): 2 [IU] via SUBCUTANEOUS
  Administered 2018-03-28: 3 [IU] via SUBCUTANEOUS
  Administered 2018-03-28: 2 [IU] via SUBCUTANEOUS
  Administered 2018-03-28: 3 [IU] via SUBCUTANEOUS

## 2018-03-26 MED ORDER — INSULIN DETEMIR 100 UNIT/ML ~~LOC~~ SOLN
5.0000 [IU] | Freq: Two times a day (BID) | SUBCUTANEOUS | Status: DC
  Administered 2018-03-26 – 2018-03-27 (×3): 5 [IU] via SUBCUTANEOUS
  Filled 2018-03-26 (×4): qty 0.05

## 2018-03-26 NOTE — Progress Notes (Signed)
Neuro MD again paged and made aware of patient's current temperature. MD made aware that I have called MRI twice since the scheduled time and have not been given a timeframe. MD said to prepare patient for lumbar puncture. Will continue to monitor.

## 2018-03-26 NOTE — Progress Notes (Signed)
Patient transferred to 6U93 w/o complications. uneventful trip

## 2018-03-26 NOTE — Progress Notes (Signed)
Spoke with Neuro MD about patient's rising temperature, and whether it might be from continued seizure activity. Patient was not showing any overt signs of seizure activity. Asked whether there is anything we want to do at that moment. Was told to wait until after MRI before proceeding. Will continue to monitor.

## 2018-03-26 NOTE — Progress Notes (Signed)
ABG unsuccessful. Patient being prepared for transfer to [phone]

## 2018-03-26 NOTE — ED Provider Notes (Signed)
Physical Exam   ED Triage Vitals  Enc Vitals Group     BP 03/25/18 1842 (!) 219/95     Pulse Rate 03/25/18 1842 100     Resp 03/25/18 1842 20     Temp 03/25/18 1842 99.1 F (37.3 C)     Temp Source 03/25/18 1842 Oral     SpO2 03/25/18 1842 99 %     Weight 03/25/18 1840 200 lb (90.7 kg)     Height 03/25/18 1840 5' 2.5" (1.588 m)     Head Circumference --      Peak Flow --      Pain Score 03/25/18 1839 0     Pain Loc --      Pain Edu? --      Excl. in [place]? --     Physical Exam  Constitutional: She appears well-developed and well-nourished. She has a sickly appearance.  Cardiovascular: Regular rhythm. Tachycardia present.  Pulmonary/Chest: Effort normal. No accessory muscle usage. No tachypnea. No respiratory distress. She has no wheezes. She has no rhonchi.  Neurological: She is disoriented. GCS eye subscore is 4. GCS verbal subscore is 3. GCS motor subscore is 5.    ED Course/Procedures     Procedure Name: Intubation Date/Time: 03/26/2018 12:04 PM Performed by: Gareth Morgan, MD Pre-anesthesia Checklist: Patient identified, Patient being monitored, Emergency Drugs available, Timeout performed and Suction available Oxygen Delivery Method: Non-rebreather mask Preoxygenation: Pre-oxygenation with 100% oxygen Induction Type: Rapid sequence Ventilation: Mask ventilation without difficulty Laryngoscope Size: Glidescope Grade View: Grade I Tube size: 7.5 mm Number of attempts: 1 Placement Confirmation: ETT inserted through vocal cords under direct vision,  CO2 detector and Breath sounds checked- equal and bilateral Tube secured with: ETT holder    .Critical Care Performed by: Gareth Morgan, MD Authorized by: Gareth Morgan, MD   Critical care provider statement:    Critical care time (minutes):  30   Critical care was time spent personally by me on the following activities:  Ordering and review of radiographic studies, pulse oximetry, examination of patient and  discussions with consultants    MDM   [phone] female with hx of htn, hlpd, DM presents initially with concern for possible seizure. Patient alert and oriented on evaluation by PA in ED on arrival, reported she had walked into store and then did not remember what happened and woke up in the ambulance. Appeared post-ictal to EMS, combative initially during transport.  Initial PA evaluation with normal neurologic exam, no headache, no fever.  Patient with severe hypertension and hyperglycemia and was sent for CT Head, however while in radiology she had seizure like activity and was transported back to room prior to imaging obtained.  Given ativan and keppra ordered with plan for stat CT however pt with worsening mental status and I evaluated patient. Patient altered, laying on her side, not following commands but making nonsensical speech, localizing.  Given desire for stat Head CT and patient's inability to lay still or follow commands and expected course of care, patient was intubated for altered mental status. Neurology, Dr. Leonel Ramsay at bedside prior to intubation.  DDx initially including ICH, seizures, hypertensive encephalopathy, HHS, PRES.  Given hydralazine prior to intubation with thought medications and sedation using post intubation would also decrease BP.  CT shows no acute infarct or large vessel occlusion.  Given patient with suspected seizure, improvement in ED without symptoms and normal neuro exam on arrival and repeat seizure, CT without acute findings, have low suspicion for hemorrhagic  or ischemic CVA and suspect more likely seizure, possible status secondary to PRES or HHS.  Neurology following and will be admitted to critical care.      Gareth Morgan, MD 03/26/18 1234

## 2018-03-26 NOTE — Progress Notes (Signed)
High fevers, still altered. I continue to suspect PRES, but will perform LP, start ABX pending results. MRI pending.   Roland Rack, MD [facility] [phone]  If 7pm- 7am, please page neurology on call as listed in [place].

## 2018-03-26 NOTE — Plan of Care (Signed)
Pt still on vent, maintaining NPO diet

## 2018-03-26 NOTE — Progress Notes (Signed)
Spoke with MD Bryum in regards to downward trend of CBG 87 on glucostabilizer. No further orders at this time, continue with glucostablizer and monitor utilizing Phase II of glycemic control sidebar, per MD Byrum.

## 2018-03-26 NOTE — Progress Notes (Signed)
[place] Progress Note Patient Name: Christina Orozco DOB: 1971-01-28 MRN: [id number]   Date of Service  03/26/2018  HPI/Events of Note  Hyperglycemia - Blood glucose = 506 --> 587.  eICU Interventions  Will order: 1. Insulin IV infusion per glucose stabilizer protocol.      Intervention Category Major Interventions: Hyperglycemia - active titration of insulin therapy  Sommer,Steven Cornelia Copa 03/26/2018, 12:38 AM

## 2018-03-26 NOTE — Progress Notes (Addendum)
CSF results: CSF reveals WBC of 30/microliter with a protein that is also abnormally elevated at 57. The WBC's are neutrophil-predominant. Interestingly, her CSF glucose is elevated at 221, but serum glucose 3.5 hours before LP was 457, so most likely the CSF glucose also is abnormally depressed (less than 60% of the serum glucose level is abnormal), consistent with infection.  Plan: Will need to continue meningitis-dose antibiotics and tailor according to CSF culture results.   MRI results: Indistinct T2 hyperintensity in the pons that is progressed from 07/25/2017 brain MRI. The patient has had fluctuating pontine signal when compared to studies in 2018 and 2019, favor atypical PRES or osmotic demyelination. Chronic microhemorrhages in the pons and deep gray nuclei that have progressed from 2018. Chronic small vessel ischemia in the periventricular white matter. Remote lacunar infarct in the right thalamus.  Neurology assessment: Based on my review of the MRI images, atypical PRES is unlikely and the shape of the nondistinct hazy pontine hyperintensity is also does not militate strongly in favor of osmotic demyelination. 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  Plan: Aggressive management of the patient's hypertension.   Electronically signed: Dr. Kerney Elbe

## 2018-03-26 NOTE — Procedures (Signed)
History: 47 year old female presenting with seizures.  Sedation: Propofol  Technique: This is a 21 channel routine scalp EEG performed at the bedside with bipolar and monopolar montages arranged in accordance to the international 10/20 system of electrode placement. One channel was dedicated to EKG recording.    Background: The background consists predominantly of diffuse beta activities with occasional runs of rhythmic alpha activity resembling sleep spindles.  There is no clear posterior dominant rhythm seen.  Photic stimulation: Physiologic driving is not performed  EEG Abnormalities: 1) sedated EEG  Clinical Interpretation: This EEG is consistent with the patient sedated state. There was no seizure or seizure predisposition recorded on this study. Please note that a normal EEG does not preclude the possibility of epilepsy.   Roland Rack, MD [facility] [phone]  If 7pm- 7am, please page neurology on call as listed in [place].

## 2018-03-26 NOTE — Progress Notes (Signed)
Propofol dose increased to 65 mcg/kg/min per bedside order of neuro MD to perform lumbar puncture.

## 2018-03-26 NOTE — Progress Notes (Signed)
Pharmacy Antibiotic Note  Blythe Veach is a 47 y.o. female admitted on 03/25/2018 with seizures.  Pharmacy has been consulted for Vancomycin/Merrem/Acyclovir dosing. Pt continues to have high fevers and altered mental status. WBC has increased from 87.2>>16.9. Renal function is worsening this AM (2.564>>3.02). Neurology getting an LP and starting meningitis coverage.  Plan: Vancomycin 1000 mg IV q24h Merrem 1g IV q12h Acyclovir 10 mg/kg IV q24h Trend WBC, temp, renal function  F/U infectious work-up Drug levels as indicated   Height: 5\' 4"  (162.6 cm) Weight: 187 lb 13.3 oz (85.2 kg) IBW/kg (Calculated) : 54.7  Temp (24hrs), Avg:99.7 F (37.6 C), Min:97.3 F (36.3 C), Max:103.3 F (39.6 C)  Recent Labs  Lab 03/25/18 1916 03/25/18 2306 03/26/18 0317  WBC 8.2  --  16.9*  CREATININE 2.54*  --  3.02*  LATICACIDVEN  --  2.56*  --     Estimated Creatinine Clearance: 25.1 mL/min (A) (by C-G formula based on SCr of 3.02 mg/dL (H)).    Allergies  Allergen Reactions  . Lisinopril Swelling and Other (See Comments)    Facial swelling   . Penicillins Hives    Has patient had a PCN reaction causing immediate rash, facial/tongue/throat swelling, SOB or lightheadedness with hypotension: yes Has patient had a PCN reaction causing severe rash involving mucus membranes or skin necrosis: No Has patient had a PCN reaction that required hospitalization No Has patient had a PCN reaction occurring within the last 10 years: No If all of the above answers are "NO", then may proceed wit  . Gabapentin Swelling and Other (See Comments)    "I thought I was having another stroke."     Narda Bonds 03/26/2018 6:57 AM

## 2018-03-26 NOTE — Progress Notes (Signed)
Spoke to E-Link MD about patient's high blood sugar. New orders received for insulin drip. Will continue to monitor.

## 2018-03-26 NOTE — Procedures (Signed)
Indication: AMS, Seizure, Fever  Risks of the procedure were dicussed with the patient including post-LP headache, bleeding(including slightly higher risk due to plavix), infection, weakness/numbness of legs(radiculopathy), death.  The patient's brother agreed and written consent was obtained.   The patient was prepped and draped, and using sterile technique a 20 gauge quinke spinal needle was inserted in the L4-5 space. On the second pass, clear CSF was obtained. The opening pressure was 20 cm H2O. Approximately 8 cc of CSF were obtained and sent for analysis.   Roland Rack, MD [facility] [phone]  If 7pm- 7am, please page neurology on call as listed in [place].

## 2018-03-26 NOTE — Progress Notes (Signed)
NAME:  Christina Orozco, MRN:  [id number], DOB:  [birth year], LOS: 1 ADMISSION DATE:  03/25/2018, CONSULTATION DATE:  03/25/2018 REFERRING MD:  Dr. Billy Fischer, ER CHIEF COMPLAINT:  Seizure   Brief History   47 yo female with multiple seizures in setting of HTN emergency and hyperglycemia.  Intubated for airway protection.   Past Medical History  CVA, HTN, DM, GERD, CKD 2, Asthma, Anemia, Neuropathy  Significant Hospital Events   10/14 admit  Consults: date of consult/date signed off & final recs:  Neurology 10/14 seizure >>  Procedures (surgical and bedside):  ETT 10/14 >>  LP 10/15 >>   Significant Diagnostic Tests:  CT head 10/14 >> no acute findings CT C-spine 10/14 >> no acute cervical abnormality, history C5-C6 surgical fusion, nasopharyngeal soft tissue swelling CT angiography 10/14 >> patent carotid, vertebral arteries, no dissection, no aneurysm EEG 10/14 >> no active seizures on propofol  Micro Data:  CSF 10/15 >> WBC 30, differential pending >>   Antimicrobials:  Acyclovir 10/14 >>  Meropenem 10/15 >>  Zosyn 10/15 >>   Subjective:  Continued to have fever overnight, LP performed early 10/15 Cleviprex weaned off Currently sedated with propofol   Objective   Blood pressure (!) 160/73, pulse (!) 109, temperature (!) 102 F (38.9 C), resp. rate (!) 26, height 5\' 4"  (1.626 m), weight 85.2 kg, last menstrual period 03/12/2018, SpO2 99 %.    Vent Mode: PRVC FiO2 (%):  [30 %-60 %] 30 % Set Rate:  [16 bmp] 16 bmp Vt Set:  [430 mL] 430 mL PEEP:  [5 cmH20] 5 cmH20 Plateau Pressure:  [10 cmH20-16 cmH20] 10 cmH20   Intake/Output Summary (Last 24 hours) at 03/26/2018 0834 Last data filed at 03/26/2018 0700 Gross per 24 hour  Intake 984.34 ml  Output 172 ml  Net 812.34 ml   Filed Weights   03/25/18 1840 03/25/18 2357  Weight: 90.7 kg 85.2 kg    Examination:  General -obese woman, sedated and intubated Head -large soft raised lesion, ? Lipoma without  evidence other trauma Eyes -pupils 2 mm, reactive to light  ENT -ET tube in place, no oral lesions Cardiac -regular, tachycardic 114, no murmur Chest -clear bilaterally, no wheezing, no crackles Abdomen -soft, nondistended, positive bowel sounds Extremities -trace pretibial edema Skin -no rash Neuro -tries to open eyes to voice stimulation, otherwise unresponsive (on propofol)  Chest x-ray 10/15 reviewed by me, endotracheal tube in good position, possible left midlung infiltrate, bibasilar atelectatic change   Assessment & Plan:  Acute encephalopathy Recurrent seizures. Suspected meningitis based on early CSF results Hx of CVA. -Appreciate neurology assistance -On Keppra -On empiric acyclovir, vancomycin.  Consider change meropenem to high-dose ceftriaxone / add ampicillin depending on isolated organism (ie Listeria). Can continue current regimen for now.  -Follow CSF culture data -Check blood cultures, pneumococcal antigen -Continue Plavix -MRI ordered and pending - RASS goal -1  HTN emergency, blood pressure currently improving. Hx of HLD. -Cleviprex has been weaned off -Continue home amlodipine, carvedilol, doxazosin, hydralazine -Goal SBP < 180  Acute respiratory failure with hypoxia from compromised airway. Hx of asthma. -Continue current ventilator support, 8 cc/kg PRVC -Follow intermittent chest x-ray -Albuterol as needed given history of obstructive lung disease  Hyperglycemia with hx of DM. -ICU hyperglycemia protocol, currently on insulin infusion  Acute renal failure, suspect from volume depletion. Anion gap metabolic acidosis CKD 2. -Continue NS 75 cc/h for now -Follow BMP, urine output  Disposition / Summary of Today's Plan 03/26/18  MRI brain Blood pressure control Follow CSF culture data Continue current mechanical ventilatory support Follow renal function for stabilization     Diet: NPO DVT prophylaxis: SQ heparin GI prophylaxis:  Protonix Mobility: Bed rest Code Status: full code Family Communication: no family at bedside  Labs   CBC: Recent Labs  Lab 03/25/18 1916 03/26/18 0317  WBC 8.2 16.9*  NEUTROABS 5.7  --   HGB 11.6* 12.2  HCT 36.2 36.3  MCV 87.2 87.1  PLT 249 124    Basic Metabolic Panel: Recent Labs  Lab 03/25/18 1916 03/26/18 0317  NA 131* 132*  K 4.1 4.1  CL 101 103  CO2 19* 15*  GLUCOSE 506* 457*  BUN 26* 31*  CREATININE 2.54* 3.02*  CALCIUM 9.1 9.1  MG  --  2.2  PHOS  --  3.8   GFR: Estimated Creatinine Clearance: 25.1 mL/min (A) (by C-G formula based on SCr of 3.02 mg/dL (H)). Recent Labs  Lab 03/25/18 1916 03/25/18 2306 03/26/18 0317  WBC 8.2  --  16.9*  LATICACIDVEN  --  2.56*  --     Liver Function Tests: Recent Labs  Lab 03/25/18 1916  AST 23  ALT 11  ALKPHOS 83  BILITOT 0.9  PROT 7.6  ALBUMIN 3.5   No results for input(s): LIPASE, AMYLASE in the last 168 hours. No results for input(s): AMMONIA in the last 168 hours.  ABG    Component Value Date/Time   PHART 7.317 (L) 03/26/2018 0109   PCO2ART 36.6 03/26/2018 0109   PO2ART 198.0 (H) 03/26/2018 0109   HCO3 18.5 (L) 03/26/2018 0109   TCO2 20 (L) 03/26/2018 0109   ACIDBASEDEF 7.0 (H) 03/26/2018 0109   O2SAT 100.0 03/26/2018 0109     Coagulation Profile: No results for input(s): INR, PROTIME in the last 168 hours.  Cardiac Enzymes: No results for input(s): CKTOTAL, CKMB, CKMBINDEX, TROPONINI in the last 168 hours.  HbA1C: Hemoglobin A1C  Date/Time Value Ref Range Status  01/11/2018 03:17 PM 10.3 (A) 4.0 - 5.6 % Final  11/09/2017 03:37 PM 8.3 (A) 4.0 - 5.6 % Final   Hgb A1c MFr Bld  Date/Time Value Ref Range Status  07/26/2017 11:18 AM 12.5 (H) 4.8 - 5.6 % Final    Comment:    (NOTE) Pre diabetes:          5.7%-6.4% Diabetes:              >6.4% Glycemic control for   <7.0% adults with diabetes   12/29/2016 02:44 PM 11.3 (H) <5.7 % Final    Comment:      For someone without known  diabetes, a hemoglobin A1c value of 6.5% or greater indicates that they may have diabetes and this should be confirmed with a follow-up test.   For someone with known diabetes, a value <7% indicates that their diabetes is well controlled and a value greater than or equal to 7% indicates suboptimal control. A1c targets should be individualized based on duration of diabetes, age, comorbid conditions, and other considerations.   Currently, no consensus exists for use of hemoglobin A1c for diagnosis of diabetes for children.       CBG: Recent Labs  Lab 03/26/18 0233 03/26/18 0328 03/26/18 0425 03/26/18 0531 03/26/18 0701  GLUCAP 488* 424* 301* 226* 160*     Independent CC time 35 minutes  Baltazar Apo, MD, PhD 03/26/2018, 9:04 AM [facility] [phone] or if no answer [phone]

## 2018-03-26 NOTE — Progress Notes (Signed)
RN asked when was a good time to schedule MRI that was pending. Informed him 0430 would work as we had therapist already in MRI with a vented patient at current time of asking. RN confirmed time with MRI and scheduled for that time.

## 2018-03-26 NOTE — Progress Notes (Signed)
ABG attempted. Venous sample obtained and entered as arterial. Will be corrected tomorrow by POC.

## 2018-03-26 NOTE — Progress Notes (Signed)
[facility] Progress Note Patient Name: Christina Orozco DOB: 1971/05/20 MRN: [id number]   Date of Service  03/26/2018  HPI/Events of Note  Hyperglycemia - Blood glucose = 112 --> 147 --> 151 --> 137.  eICU Interventions  Will order: 1. D/C Insulin IV infusion.  2. Levemir 5 units Lenora Q 12 hours + Q 4 hour standard Novolog SSI.     Intervention Category Major Interventions: Hyperglycemia - active titration of insulin therapy  Lysle Dingwall 03/26/2018, 8:27 PM

## 2018-03-26 NOTE — Progress Notes (Signed)
MRI was planned for 0430. Respiratory therapy was unable to go any sooner. MRI was ordered STAT at 2153 on October 14 approx. 2 hours before arrival to unit while patient was in ED. MRI has been called twice within the past 45 minutes(0452 and 0506) to see when they will be available for me to come down. No timeframe given. Awaiting to hear from MRI. Will continue to monitor.

## 2018-03-27 ENCOUNTER — Inpatient Hospital Stay (HOSPITAL_COMMUNITY): Payer: BLUE CROSS/BLUE SHIELD

## 2018-03-27 DIAGNOSIS — J9601 Acute respiratory failure with hypoxia: Secondary | ICD-10-CM

## 2018-03-27 LAB — BLOOD GAS, ARTERIAL
Acid-base deficit: 7.1 mmol/L — ABNORMAL HIGH (ref 0.0–2.0)
Bicarbonate: 18.4 mmol/L — ABNORMAL LOW (ref 20.0–28.0)
DRAWN BY: 313941
FIO2: 30
MECHVT: 410 mL
O2 Saturation: 97.6 %
PATIENT TEMPERATURE: 98.9
PCO2 ART: 40.6 mmHg (ref 32.0–48.0)
PEEP/CPAP: 5 cmH2O
PO2 ART: 110 mmHg — AB (ref 83.0–108.0)
RATE: 16 resp/min
pH, Arterial: 7.28 — ABNORMAL LOW (ref 7.350–7.450)

## 2018-03-27 LAB — CBC
HCT: 30.9 % — ABNORMAL LOW (ref 36.0–46.0)
Hemoglobin: 9.7 g/dL — ABNORMAL LOW (ref 12.0–15.0)
MCH: 27.8 pg (ref 26.0–34.0)
MCHC: 31.4 g/dL (ref 30.0–36.0)
MCV: 88.5 fL (ref 80.0–100.0)
PLATELETS: 201 10*3/uL (ref 150–400)
RBC: 3.49 MIL/uL — ABNORMAL LOW (ref 3.87–5.11)
RDW: 14.6 % (ref 11.5–15.5)
WBC: 16.2 10*3/uL — ABNORMAL HIGH (ref 4.0–10.5)
nRBC: 0 % (ref 0.0–0.2)

## 2018-03-27 LAB — BASIC METABOLIC PANEL
ANION GAP: 12 (ref 5–15)
BUN: 38 mg/dL — ABNORMAL HIGH (ref 6–20)
CALCIUM: 8.1 mg/dL — AB (ref 8.9–10.3)
CHLORIDE: 107 mmol/L (ref 98–111)
CO2: 18 mmol/L — AB (ref 22–32)
CREATININE: 3.92 mg/dL — AB (ref 0.44–1.00)
GFR calc Af Amer: 15 mL/min — ABNORMAL LOW (ref >60)
GFR calc non Af Amer: 13 mL/min — ABNORMAL LOW (ref >60)
GLUCOSE: 119 mg/dL — AB (ref 70–99)
Potassium: 3.6 mmol/L (ref 3.5–5.1)
Sodium: 137 mmol/L (ref 135–145)

## 2018-03-27 LAB — HERPES SIMPLEX VIRUS(HSV) DNA BY PCR
HSV 1 DNA: NEGATIVE
HSV 2 DNA: NEGATIVE

## 2018-03-27 LAB — MAGNESIUM
MAGNESIUM: 1.9 mg/dL (ref 1.7–2.4)
Magnesium: 2.6 mg/dL — ABNORMAL HIGH (ref 1.7–2.4)

## 2018-03-27 LAB — GLUCOSE, CAPILLARY
GLUCOSE-CAPILLARY: 189 mg/dL — AB (ref 70–99)
GLUCOSE-CAPILLARY: 152 mg/dL — AB (ref 70–99)
GLUCOSE-CAPILLARY: 167 mg/dL — AB (ref 70–99)
Glucose-Capillary: 104 mg/dL — ABNORMAL HIGH (ref 70–99)
Glucose-Capillary: 187 mg/dL — ABNORMAL HIGH (ref 70–99)

## 2018-03-27 LAB — CRYPTOCOCCAL ANTIGEN, CSF: CRYPTO AG: NEGATIVE

## 2018-03-27 LAB — PROTIME-INR
INR: 1.31
PROTHROMBIN TIME: 16.2 s — AB (ref 11.4–15.2)

## 2018-03-27 LAB — PHOSPHORUS: Phosphorus: 5.6 mg/dL — ABNORMAL HIGH (ref 2.5–4.6)

## 2018-03-27 LAB — TRIGLYCERIDES: Triglycerides: 138 mg/dL (ref <150)

## 2018-03-27 MED ORDER — FENTANYL BOLUS VIA INFUSION
50.0000 ug | INTRAVENOUS | Status: DC | PRN
  Filled 2018-03-27: qty 50

## 2018-03-27 MED ORDER — PRO-STAT SUGAR FREE PO LIQD: 30.0000 mL | Freq: Two times a day (BID) | ORAL | Status: DC

## 2018-03-27 MED ORDER — FENTANYL CITRATE (PF) 100 MCG/2ML IJ SOLN
50.0000 ug | INTRAMUSCULAR | Status: DC | PRN
  Administered 2018-03-27 – 2018-03-28 (×3): 50 ug via INTRAVENOUS
  Filled 2018-03-27 (×3): qty 2

## 2018-03-27 MED ORDER — VITAL HIGH PROTEIN PO LIQD: 1000.0000 mL | ORAL | Status: DC

## 2018-03-27 MED ORDER — SODIUM CHLORIDE 0.9 % IV BOLUS
1000.0000 mL | Freq: Once | INTRAVENOUS | Status: AC
  Administered 2018-03-27: 1000 mL via INTRAVENOUS

## 2018-03-27 MED ORDER — PHENYLEPHRINE HCL-NACL 10-0.9 MG/250ML-% IV SOLN
0.0000 ug/min | INTRAVENOUS | Status: DC
  Filled 2018-03-27: qty 250

## 2018-03-27 MED ORDER — SODIUM CHLORIDE 0.9 % IV BOLUS
500.0000 mL | Freq: Once | INTRAVENOUS | Status: AC
  Administered 2018-03-27: 500 mL via INTRAVENOUS

## 2018-03-27 MED ORDER — MAGNESIUM SULFATE 2 GM/50ML IV SOLN
2.0000 g | Freq: Once | INTRAVENOUS | Status: AC
  Administered 2018-03-27: 2 g via INTRAVENOUS
  Filled 2018-03-27: qty 50

## 2018-03-27 MED ORDER — VITAL HIGH PROTEIN PO LIQD
1000.0000 mL | ORAL | Status: DC
  Administered 2018-03-27 – 2018-03-29 (×3): 1000 mL

## 2018-03-27 MED ORDER — VANCOMYCIN HCL IN DEXTROSE 1-5 GM/200ML-% IV SOLN
1000.0000 mg | INTRAVENOUS | Status: DC
  Administered 2018-03-29: 1000 mg via INTRAVENOUS
  Filled 2018-03-27: qty 200

## 2018-03-27 NOTE — Progress Notes (Signed)
NAME:  Christina Orozco, MRN:  [id number], DOB:  05-31-1971, LOS: 2 ADMISSION DATE:  03/25/2018, CONSULTATION DATE:  03/25/2018 REFERRING MD:  Dr. Billy Fischer, ER CHIEF COMPLAINT:  Seizure   Brief History   47 yo female with multiple seizures in setting of HTN emergency and hyperglycemia.  Intubated for airway protection.   Past Medical History  CVA, HTN, DM, GERD, CKD 2, Asthma, Anemia, Neuropathy  Significant Hospital Events   10/14 admit  Consults: date of consult/date signed off & final recs:  Neurology 10/14 seizure >>  Procedures (surgical and bedside):  ETT 10/14 >>  LP 10/15 >>   Significant Diagnostic Tests:  CT head 10/14 >> no acute findings CT C-spine 10/14 >> no acute cervical abnormality, history C5-C6 surgical fusion, nasopharyngeal soft tissue swelling CT angiography 10/14 >> patent carotid, vertebral arteries, no dissection, no aneurysm EEG 10/14 >> no active seizures on propofol   Micro Data:  CSF 10/15 >> WBC 30, differential pending >>  03/26/2018 blood culture>>  Antimicrobials:  Acyclovir 10/14 >>  Meropenem 10/15 >>  Zosyn 10/15 >>   Subjective:  Sedation is currently off and more interactive Off Cleviprex   Objective   Blood pressure (!) 112/52, pulse 86, temperature 97.7 F (36.5 C), resp. rate 15, height 5\' 4"  (1.626 m), weight 85.2 kg, last menstrual period 03/12/2018, SpO2 100 %.    Vent Mode: PRVC FiO2 (%):  [30 %] 30 % Set Rate:  [16 bmp] 16 bmp Vt Set:  [410 mL-430 mL] 410 mL PEEP:  [5 cmH20] 5 cmH20 Plateau Pressure:  [12 cmH20-17 cmH20] 15 cmH20   Intake/Output Summary (Last 24 hours) at 03/27/2018 0837 Last data filed at 03/27/2018 0600 Gross per 24 hour  Intake 3319.14 ml  Output 495 ml  Net 2824.14 ml   Filed Weights   03/25/18 1840 03/25/18 2357  Weight: 90.7 kg 85.2 kg    Examination:  General: Obese female no acute distress HEENT: Endotracheal tube to ventilator, orogastric tube.  Pupils are equal reactive  does not track or follow Neuro: Appears more awake, he has a sensitive tried to focus and follow, withdrawals right arm and lower extremities to noxious stimuli, left arm is unresponsive CV: Sounds are regular regular rate and rhythm frequent atypical beats noted PULM: even/non-labored, lungs bilaterally decreased in bases FI:EPPI, non-tender, bsx4 active  Extremities: warm/dry, 1+ edema  Skin: no rashes or lesions   Chest x-ray 10/15 reviewed by me, endotracheal tube in good position, possible left midlung infiltrate, bibasilar atelectatic change   Assessment & Plan:  Acute encephalopathy Recurrent seizures. Suspected meningitis based on early CSF results Hx of CVA. -Neurology is following -On Keppra -On empiric acyclovir and vancomycin .  -Follow cerebrospinal fluid culture and fungus has been added on 03/27/2018 l -Follow blood cultures -RA SS score 0 to -1, wean sedation as tolerated -MRI with T2 with hyper intensified in the pons that is increased.  Chronic microhemorrhages in the pons and deep gray nuclei to have progressed.  Chronic small vessel ischemia and remote lacunar infarct in the right thalamus  HTN emergency, blood pressure currently improving. Hx of HLD. -Continue to treat hypertension -New current antihypertensive -Goal SBP < 180  Acute respiratory failure with hypoxia from compromised airway. Hx of asthma. -Wean from ventilator as tolerated -Serial chest x-ray -Dilators as needed  Hyperglycemia with hx of DM. CBG (last 3)  Recent Labs    03/26/18 2341 03/27/18 0359 03/27/18 0818  GLUCAP 139* 104* 189*    -  Continue ICU hyperglycemia protocol. -Tube feeds started may require increased insulin dose  Acute renal failure, suspect from volume depletion. Anion gap metabolic acidosis CKD 2. Lab Results  Component Value Date   CREATININE 3.92 (H) 03/27/2018   CREATININE 3.50 (H) 03/26/2018   CREATININE 3.02 (H) 03/26/2018   CREATININE 1.73 (H)  04/23/2017   CREATININE 1.33 (H) 11/21/2016   CREATININE 1.29 (H) 10/06/2016   Recent Labs  Lab 03/26/18 0317 03/26/18 1843 03/27/18 0414  K 4.1 3.3* 3.6    -Continue normal saline at 75 cc an -Normal saline bolus x1 -Follow lab data -May need renal consult near future   Disposition / Summary of Today's Plan 03/27/18   Hold sedation for work-up assessment Continue aggressively treat hypertension Continue meningitis dose antibiotics Add fungus culture to cerebrospinal fluid Continue to monitor acute renal insufficiency Been having atypical beats continue to monitor and replete magnesium      Diet: 03/27/2018 we will start tube feeding DVT prophylaxis: SQ heparin GI prophylaxis: Protonix Mobility: Bed rest Code Status: full code Family Communication: 03/27/2018 no family at bedside.  Labs   CBC: Recent Labs  Lab 03/25/18 1916 03/26/18 0317 03/27/18 0414  WBC 8.2 16.9* 16.2*  NEUTROABS 5.7  --   --   HGB 11.6* 12.2 9.7*  HCT 36.2 36.3 30.9*  MCV 87.2 87.1 88.5  PLT 249 287 409    Basic Metabolic Panel: Recent Labs  Lab 03/25/18 1916 03/26/18 0317 03/26/18 1843 03/27/18 0414  NA 131* 132* 136 137  K 4.1 4.1 3.3* 3.6  CL 101 103 106 107  CO2 19* 15* 19* 18*  GLUCOSE 506* 457* 148* 119*  BUN 26* 31* 34* 38*  CREATININE 2.54* 3.02* 3.50* 3.92*  CALCIUM 9.1 9.1 8.6* 8.1*  MG  --  2.2  --  1.9  PHOS  --  3.8  --   --    GFR: Estimated Creatinine Clearance: 19.3 mL/min (A) (by C-G formula based on SCr of 3.92 mg/dL (H)). Recent Labs  Lab 03/25/18 1916 03/25/18 2306 03/26/18 0317 03/27/18 0414  WBC 8.2  --  16.9* 16.2*  LATICACIDVEN  --  2.56*  --   --     Liver Function Tests: Recent Labs  Lab 03/25/18 1916  AST 23  ALT 11  ALKPHOS 83  BILITOT 0.9  PROT 7.6  ALBUMIN 3.5   No results for input(s): LIPASE, AMYLASE in the last 168 hours. No results for input(s): AMMONIA in the last 168 hours.  ABG    Component Value Date/Time    PHART 7.280 (L) 03/27/2018 0355   PCO2ART 40.6 03/27/2018 0355   PO2ART 110 (H) 03/27/2018 0355   HCO3 18.4 (L) 03/27/2018 0355   TCO2 20 (L) 03/26/2018 0109   ACIDBASEDEF 7.1 (H) 03/27/2018 0355   O2SAT 97.6 03/27/2018 0355     Coagulation Profile: Recent Labs  Lab 03/27/18 0414  INR 1.31    Cardiac Enzymes: No results for input(s): CKTOTAL, CKMB, CKMBINDEX, TROPONINI in the last 168 hours.  HbA1C: Hemoglobin A1C  Date/Time Value Ref Range Status  01/11/2018 03:17 PM 10.3 (A) 4.0 - 5.6 % Final  11/09/2017 03:37 PM 8.3 (A) 4.0 - 5.6 % Final   Hgb A1c MFr Bld  Date/Time Value Ref Range Status  07/26/2017 11:18 AM 12.5 (H) 4.8 - 5.6 % Final    Comment:    (NOTE) Pre diabetes:          5.7%-6.4% Diabetes:              >  6.4% Glycemic control for   <7.0% adults with diabetes   12/29/2016 02:44 PM 11.3 (H) <5.7 % Final    Comment:      For someone without known diabetes, a hemoglobin A1c value of 6.5% or greater indicates that they may have diabetes and this should be confirmed with a follow-up test.   For someone with known diabetes, a value <7% indicates that their diabetes is well controlled and a value greater than or equal to 7% indicates suboptimal control. A1c targets should be individualized based on duration of diabetes, age, comorbid conditions, and other considerations.   Currently, no consensus exists for use of hemoglobin A1c for diagnosis of diabetes for children.       CBG: Recent Labs  Lab 03/26/18 1918 03/26/18 2037 03/26/18 2341 03/27/18 0359 03/27/18 0818  GLUCAP 137* 154* 139* 104* 189*     NP Independent CC time 34 minutes  Richardson Landry Agapita Savarino ACNP [facility] P[facility] Pager [phone] till 1 pm If no answer page [phone] 03/27/2018, 8:37 AM

## 2018-03-27 NOTE — Progress Notes (Signed)
WBC in CSF relatively low for bacterial infection. Discussed with Dr. Leonel Ramsay and we agree that she should have a fungal cx and stain added to her CSF labs (ordered). A cryptococcal antigen has also been ordered.  Electronically signed: Dr. Kerney Elbe

## 2018-03-27 NOTE — Progress Notes (Signed)
Pharmacy Antibiotic Note  Christina Orozco is a 47 y.o. female admitted on 03/25/2018 with seizures.  Pharmacy has been consulted for Vancomycin/Merrem/Acyclovir dosing. Pt continues to have high fevers and altered mental status. WBC has increased from 87.2>>16.9. Renal function continues to worsen this AM (2.564>>3.02>>3.92). Started meningitis coverage.  Plan: Will change Vancomycin to 1000 mg IV q48h due to worsening renal function.  Continue Merrem 1g IV q12h Continue Acyclovir 10 mg/kg IV q24h Trend WBC, temp, renal function  F/U infectious work-up Drug levels as indicated  Height: 5\' 4"  (162.6 cm) Weight: 187 lb 13.3 oz (85.2 kg) IBW/kg (Calculated) : 54.7  Temp (24hrs), Avg:100 F (37.8 C), Min:97.7 F (36.5 C), Max:101.3 F (38.5 C)  Recent Labs  Lab 03/25/18 1916 03/25/18 2306 03/26/18 0317 03/26/18 1843 03/27/18 0414  WBC 8.2  --  16.9*  --  16.2*  CREATININE 2.54*  --  3.02* 3.50* 3.92*  LATICACIDVEN  --  2.56*  --   --   --     Estimated Creatinine Clearance: 19.3 mL/min (A) (by C-G formula based on SCr of 3.92 mg/dL (H)).    Allergies  Allergen Reactions  . Lisinopril Swelling and Other (See Comments)    Facial swelling   . Penicillins Hives    Has patient had a PCN reaction causing immediate rash, facial/tongue/throat swelling, SOB or lightheadedness with hypotension: yes Has patient had a PCN reaction causing severe rash involving mucus membranes or skin necrosis: No Has patient had a PCN reaction that required hospitalization No Has patient had a PCN reaction occurring within the last 10 years: No If all of the above answers are "NO", then may proceed wit  . Gabapentin Swelling and Other (See Comments)    "I thought I was having another stroke."     Alanda Slim, PharmD, [facility] Clinical Pharmacist Please see AMION for all Pharmacists' Contact Phone Numbers 03/27/2018, 9:07 AM

## 2018-03-27 NOTE — Progress Notes (Signed)
Initial Nutrition Assessment  DOCUMENTATION CODES:   Obesity unspecified  INTERVENTION:   Initiate Vital High Protein @ 55 ml/hr (1320 ml/day)  Provides: 1320 kcal, 115 grams protein, and 1103 ml free water.    NUTRITION DIAGNOSIS:   Inadequate oral intake related to inability to eat as evidenced by NPO status.  GOAL:   Provide needs based on ASPEN/SCCM guidelines   MONITOR:   Labs, TF tolerance  REASON FOR ASSESSMENT:   Consult, Ventilator Enteral/tube feeding initiation and management  ASSESSMENT:   Pt with PMH of HTN, stroke, stage IV CKD, severe anemia, and DM admitted after multiple seizures in the setting of HTN emergency and hyperglycemia. Working diagnosis of bacterial meningitis.    Patient is currently intubated on ventilator support Temp (24hrs), Avg:99.7 F (37.6 C), Min:97.5 F (36.4 C), Max:101.3 F (38.5 C) BP: 98/63 MAP: 70   I/O: +4404 ml since admit UOP: 510 ml x 24 hrs OG: tip in duodenum Previous weights: 203-204 lb PTA No family present to determine if pt has had any weight loss  Medications reviewed and include: novolog 1-3 units every 4 hours, levemir 5 units every 12 hours  Labs reviewed: Cr 3.92, TG: 138 Lab Results  Component Value Date   HGBA1C 10.3 (A) 01/11/2018     NUTRITION - FOCUSED PHYSICAL EXAM:    Most Recent Value  Orbital Region  No depletion  Upper Arm Region  No depletion  Thoracic and Lumbar Region  No depletion  Buccal Region  Unable to assess  Temple Region  No depletion  Clavicle Bone Region  No depletion  Clavicle and Acromion Bone Region  No depletion  Scapular Bone Region  Unable to assess  Dorsal Hand  No depletion  Patellar Region  No depletion  Anterior Thigh Region  No depletion  Posterior Calf Region  No depletion  Edema (RD Assessment)  Mild  Hair  Reviewed [braided]  Eyes  Unable to assess  Mouth  Unable to assess  Skin  Reviewed  Nails  Reviewed [artifical nails]       Diet Order:    Diet Order            Diet NPO time specified  Diet effective now              EDUCATION NEEDS:   No education needs have been identified at this time  Skin:  Skin Assessment: Reviewed RN Assessment  Last BM:  unknown  Height:   Ht Readings from Last 1 Encounters:  03/25/18 5\' 4"  (1.626 m)    Weight:   Wt Readings from Last 1 Encounters:  03/25/18 85.2 kg    Ideal Body Weight:  54.5 kg  BMI:  Body mass index is 32.24 kg/m.  Estimated Nutritional Needs:   Kcal:  5573-2202  Protein:  >/= 109 grams  Fluid:  > 1.5 L/day  Maylon Peppers RD, LDN, CNSC [phone] Pager [phone] After Hours Pager

## 2018-03-27 NOTE — Progress Notes (Signed)
[facility] Progress Note Patient Name: Christina Orozco DOB: 11/08/1970 MRN: [id number]   Date of Service  03/27/2018  HPI/Events of Note  Oliguria - No CVL or CVP.   eICU Interventions  Will order: 1. Bolus with 0.9 NaCl 1 liter IV over 1 hour now.      Intervention Category Intermediate Interventions: Oliguria - evaluation and management  Sommer,Steven Eugene 03/27/2018, 12:02 AM

## 2018-03-27 NOTE — Progress Notes (Signed)
[facility] Progress Note Patient Name: Onie Kasparek DOB: 01/24/1971 MRN: [id number]   Date of Service  03/27/2018  HPI/Events of Note  K+ = 3.3 and Creatinine = 3.50.   eICU Interventions  Will not replace K+ d/t severe renal dysfunction.      Intervention Category Major Interventions: Electrolyte abnormality - evaluation and management  Sommer,Steven Eugene 03/27/2018, 5:04 AM

## 2018-03-27 NOTE — Progress Notes (Addendum)
PCCM Attending Critical Care Note:  47 year old woman admitted with multiple seizures and intubated for airway protection.   Working diagnosis is bacterial meningitis.  Subjective   No further seizures. Patient will attempt to open eyes and moves all limbs except LUE spontaneously when off fentanyl infusion which was restarted for ventilator asynchrony. Decreased urine output.  Objective   Physical Exam  Constitutional: She appears well-developed and well-nourished. No distress. She is intubated.  HENT:  Head: Normocephalic.  Right forehead lipoma. OGT and ETT in place.   Eyes: Pupils are equal, round, and reactive to light. Conjunctivae are normal. Right eye exhibits abnormal extraocular motion. Left eye exhibits abnormal extraocular motion.  Normal tear film. Dysconjugate gaze.  Neck: No JVD present. No tracheal deviation present.  Cardiovascular: Regular rhythm, S1 normal, S2 normal and normal pulses. Exam reveals no gallop.  No murmur heard. Pulmonary/Chest: Breath sounds normal. She is intubated. No respiratory distress.  No ventilator asynchrony. Hypopnea when I switched her to PSV.  Abdominal: Normal appearance and bowel sounds are normal. There is no tenderness.  Musculoskeletal:  Normal muscle bulk  Neurological: She is unresponsive. No cranial nerve deficit. GCS eye subscore is 1. GCS verbal subscore is 1. GCS motor subscore is 4.  I paused sedation for the exam. Plegic LUE  Skin: Skin is warm and dry.  Line sites intact.   I performed a focused cardiac ultrasound which showed hyperdynamic LVSF with LV thickening. Possible LV gradient of 30. No significant valvular abnormalities. RV function and size normal.  IVC small in diameter. E/e' suggests low filling pressures. Technically difficult study.  Ancillary tests personally reviewed:  Persistent leukocytosis at 16.2 Anemia of critical illness with HB 9.7 Stage 3 CKD with slowly rising creatinine at 6.29 Mild  metabolic acidosis with CO2 18  Adequate glycemic control. CSF Gram stain and cultures negative so far CXR from 10/16 shows lines in position. Small lung volumes but no clear infiltrates. MRI from 10/15 shows non-specific changes consistent with ischemic changes.  Assessment:  Remains critically ill due to respiratory failure requiring mechanical ventilation due to inability to protect airway. Tolerating current ventilator settings well but I was unable to transition her to PSV due to hypoventilation.   I have stopped her fentanyl infusion and will control agitation and ventilator dyssynchrony with intermittent fentanyl only.  Remains critically ill due to acute obtundation due to presumed bacterial meningitis. Currently on empiric acyclovir, meropenem and vancomycin, but no organisms seen on smear. Pleocytosis may be due to seizures. Fungal cultures are pending. I will limit sedation to attempt to obtain a better examination.  Continue Keppra and monitor for recurrent seizures.  Hypertension now well controlled on enteral amlodipine and clevidipine stopped.  Worsening renal function with marginally low BP likely leading to renal hypoperfusion. May need higher blood pressure for renal perfusion. Target MAP> 70.  Hypertensive heart disease, patient is likely preload dependent. I have held antihypertensive medicine and bolused fluids with improvement in urine output. Will support BP with phenylephrine if necessary to maintain renal perfusion.  Improved glycemic control on basal bolus insulin. Monitor for worsening control once tube feeds started.  CRITICAL CARE Performed by: Kipp Brood  Total critical care time: 40 minutes  Critical care time was exclusive of separately billable procedures and treating other patients.  Critical care was necessary to treat or prevent imminent or life-threatening deterioration.  Critical care was time spent personally by me on the following activities:  development of treatment plan with  patient and/or surrogate as well as nursing, discussions with consultants, evaluation of patient's response to treatment, examination of patient, obtaining history from patient or surrogate, ordering and performing treatments and interventions, ordering and review of laboratory studies, ordering and review of radiographic studies, pulse oximetry and re-evaluation of patient's condition.   Kipp Brood, MD [facility] ICU Physician [place]  Pager: [phone] Mobile: [phone] After hours: [phone].

## 2018-03-27 NOTE — Progress Notes (Signed)
Per day shift RN, Dr. Lynetta Mare ordered to hold BP meds if SBP < 140 d/t hypotension today.

## 2018-03-28 ENCOUNTER — Inpatient Hospital Stay (HOSPITAL_COMMUNITY): Payer: BLUE CROSS/BLUE SHIELD

## 2018-03-28 DIAGNOSIS — G9340 Encephalopathy, unspecified: Secondary | ICD-10-CM

## 2018-03-28 LAB — BASIC METABOLIC PANEL
Anion gap: 11 (ref 5–15)
BUN: 47 mg/dL — AB (ref 6–20)
CALCIUM: 7.4 mg/dL — AB (ref 8.9–10.3)
CHLORIDE: 108 mmol/L (ref 98–111)
CO2: 16 mmol/L — ABNORMAL LOW (ref 22–32)
CREATININE: 3.85 mg/dL — AB (ref 0.44–1.00)
GFR calc Af Amer: 15 mL/min — ABNORMAL LOW (ref >60)
GFR, EST NON AFRICAN AMERICAN: 13 mL/min — AB (ref >60)
Glucose, Bld: 242 mg/dL — ABNORMAL HIGH (ref 70–99)
Potassium: 3.4 mmol/L — ABNORMAL LOW (ref 3.5–5.1)
SODIUM: 135 mmol/L (ref 135–145)

## 2018-03-28 LAB — GLUCOSE, CAPILLARY
GLUCOSE-CAPILLARY: 245 mg/dL — AB (ref 70–99)
GLUCOSE-CAPILLARY: 255 mg/dL — AB (ref 70–99)
Glucose-Capillary: 238 mg/dL — ABNORMAL HIGH (ref 70–99)
Glucose-Capillary: 199 mg/dL — ABNORMAL HIGH (ref 70–99)
Glucose-Capillary: 296 mg/dL — ABNORMAL HIGH (ref 70–99)
Glucose-Capillary: 107 mg/dL — ABNORMAL HIGH (ref 70–99)
Glucose-Capillary: 148 mg/dL — ABNORMAL HIGH (ref 70–99)

## 2018-03-28 LAB — MAGNESIUM
MAGNESIUM: 2.6 mg/dL — AB (ref 1.7–2.4)
Magnesium: 2.6 mg/dL — ABNORMAL HIGH (ref 1.7–2.4)

## 2018-03-28 LAB — PHOSPHORUS
PHOSPHORUS: 5.5 mg/dL — AB (ref 2.5–4.6)
PHOSPHORUS: 3.5 mg/dL (ref 2.5–4.6)

## 2018-03-28 MED ORDER — INSULIN ASPART 100 UNIT/ML ~~LOC~~ SOLN
0.0000 [IU] | SUBCUTANEOUS | Status: DC
  Administered 2018-03-28: 2 [IU] via SUBCUTANEOUS
  Administered 2018-03-28 (×2): 8 [IU] via SUBCUTANEOUS
  Administered 2018-03-29: 5 [IU] via SUBCUTANEOUS
  Administered 2018-03-29: 3 [IU] via SUBCUTANEOUS
  Administered 2018-03-29 (×2): 2 [IU] via SUBCUTANEOUS
  Administered 2018-03-29 – 2018-03-30 (×2): 5 [IU] via SUBCUTANEOUS
  Administered 2018-03-30 (×2): 3 [IU] via SUBCUTANEOUS

## 2018-03-28 MED ORDER — INSULIN DETEMIR 100 UNIT/ML ~~LOC~~ SOLN
10.0000 [IU] | Freq: Two times a day (BID) | SUBCUTANEOUS | Status: DC
  Administered 2018-03-28 – 2018-04-01 (×9): 10 [IU] via SUBCUTANEOUS
  Filled 2018-03-28 (×10): qty 0.1

## 2018-03-28 MED ORDER — POTASSIUM CHLORIDE 20 MEQ PO PACK
40.0000 meq | PACK | Freq: Once | ORAL | Status: AC
  Administered 2018-03-28: 40 meq
  Filled 2018-03-28: qty 2

## 2018-03-28 MED ORDER — OXYCODONE HCL 5 MG PO TABS
5.0000 mg | ORAL_TABLET | ORAL | Status: DC | PRN
  Administered 2018-03-28 – 2018-03-31 (×2): 5 mg via ORAL
  Filled 2018-03-28 (×2): qty 1

## 2018-03-28 NOTE — Progress Notes (Signed)
Subjective: There is question of left-sided weakness earlier today, no further seizures  Exam: Vitals:   03/28/18 2010 03/28/18 2123  BP:  (!) 156/75  Pulse: 94   Resp: (!) 22   Temp:    SpO2: 99%    Gen: In bed, intubated Resp: non-labored breathing, no acute distress Abd: soft, nt  Neuro: MS: She awakens easily, follows commands readily answers questions with head shakes/nods CN: EOMI, PERRL, endorses seeing fingers wiggling in both hemifields Motor: She lives all 4 extremities out of bed against gravity, but does not give good effort, no clear asymmetry on my exam  sensory: She endorses symmetric sensation to light touch  Pertinent Labs: Elevated creatinine at 3.85, GFR 15  Impression: 47 year old female with new onset seizures in the setting of ? CNS infection.  There is no definite evidence of posterior reversible encephalopathy syndrome on MRI, though the changes in the pons could be suspicious for this.  I still wonder about her hypertension contributing to the onset of the seizures, also possible be hypoglycemic induced seizure.  The pleocytosis is higher than I would typically expect to see from seizures alone, though I think that this could be possible.  Recommendations: 1) continue Keppra 500 mg twice daily (maximum dose due to her renal function) 2) neurology will continue to follow  Roland Rack, MD [facility] [phone]  If 7pm- 7am, please page neurology on call as listed in [place].

## 2018-03-28 NOTE — Progress Notes (Signed)
NAME:  Christina Orozco, MRN:  [id number], DOB:  [birth year], LOS: 3 ADMISSION DATE:  03/25/2018, CONSULTATION DATE:  03/25/2018 REFERRING MD:  Dr. Billy Fischer, ER CHIEF COMPLAINT:  Seizure   Brief History   47 yo female with multiple seizures in setting of HTN emergency and hyperglycemia.  Intubated for airway protection.   Past Medical History  CVA, HTN, DM, GERD, CKD 2, Asthma, Anemia, Neuropathy  Significant Hospital Events   10/14 admit  Consults: date of consult/date signed off & final recs:  Neurology 10/14 for seizures   Procedures (surgical and bedside):  ETT 10/14 >>  LP 10/15 >>   Significant Diagnostic Tests:  CT head 10/14 >> no acute findings CT C-spine 10/14 >> no acute cervical abnormality, history C5-C6 surgical fusion, nasopharyngeal soft tissue swelling CT angiography 10/14 >> patent carotid, vertebral arteries, no dissection, no aneurysm EEG 10/14 >> no active seizures on propofol   Micro Data:  CSF 10/15 >> WBC 30, differential pending >>  03/26/2018 blood culture>> negative 10/15 CSF remains negative.  10/15 HSV PCR is negative.  Antimicrobials:  Acyclovir 10/14 >>10/17  Meropenem 10/15 >>  Zosyn 10/15 >>   Subjective:  Continues to become more interactive off sedation. Now following commands weakly per RN  Objective   Blood pressure (!) 144/67, pulse 96, temperature 99.3 F (37.4 C), resp. rate 17, height 5\' 4"  (1.626 m), weight 88.3 kg, last menstrual period 03/12/2018, SpO2 100 %.    Vent Mode: PSV;CPAP FiO2 (%):  [30 %] 30 % Set Rate:  [16 bmp] 16 bmp Vt Set:  [410 mL] 410 mL PEEP:  [5 cmH20] 5 cmH20 Pressure Support:  [5 cmH20-8 cmH20] 8 cmH20   Intake/Output Summary (Last 24 hours) at 03/28/2018 1200 Last data filed at 03/28/2018 1001 Gross per 24 hour  Intake 3848.66 ml  Output 645 ml  Net 3203.66 ml   Filed Weights   03/25/18 1840 03/25/18 2357 03/28/18 0500  Weight: 90.7 kg 85.2 kg 88.3 kg    Examination:  General:  Obese female no acute distress HEENT: ETT and OGT in place with no skin breakdown. Neuro: Follows commands weakly in all limbs, but weakest in LUE, Lid apraxia. CV: HS are normal, extremities are warm and well perfused. PULM: even/non-labored, lungs bilaterally decreased in bases. Tolerating PSV weaning. FX:TKWI, non-tender, bowel sound are present.  Extremities: warm/dry, 1+ edema  Skin: no rashes or lesions   Assessment & Plan:  Acute encephalopathy. With Recurrent seizures. Suspected meningitis based on early CSF results, but pleiocytosis may be due to seizures instead. Hx of CVA, and MRI shows microvascular changes, PRES or variant may be more likely cause. Continue Keppra for seizure prevention, monitor for recurrent seizure activity.  Minimize sedation. Have added enteral pain control options. Decrease frequency of neurochecks to encourage sleep. Acyclovir stopped and will stop antibiotics if cultures remain negative.  HTN emergency, blood pressure currently improving. Hx of HLD. Continue amlodipine, carvedilol and hydralazine and titrate to achieve SBP<140. Keep SBP<180 acutely.  Acute respiratory failure with hypoxia from compromised airway. Remains critically ill for this reason. Hx of asthma. Has tolerated transition to PSV.  Continue to monitor for desaturation as we continue to hydrate for worsening renal function. May be ready for extubation soon once mentation improves.  Hyperglycemia with hx of DM. CBG (last 3)  Recent Labs    03/28/18 0002 03/28/18 0348 03/28/18 0813  GLUCAP 245* 238* 199*   Increased basal insulin and SSI for poorly controlled hyperglycemia.  Acute  renal failure, suspect from volume depletion. Anion gap metabolic acidosis CKD 2. Lab Results  Component Value Date   CREATININE 3.85 (H) 03/28/2018   CREATININE 3.92 (H) 03/27/2018   CREATININE 3.50 (H) 03/26/2018   CREATININE 1.73 (H) 04/23/2017   CREATININE 1.33 (H) 11/21/2016    CREATININE 1.29 (H) 10/06/2016   Recent Labs  Lab 03/26/18 1843 03/27/18 0414 03/28/18 0322  K 3.3* 3.6 3.4*   Improving with fluid administration. Will stop iv fluids once creatinine clearly improving or oxygenation becomes a problem   Disposition / Summary of Today's Plan 03/28/18   Optimize BP control. PSV as tolerated Monitor neurological status.    Diet: 03/27/2018 Continuet tube feeding DVT prophylaxis: SQ heparin GI prophylaxis: Protonix Mobility: Bed rest Code Status: full code Family Communication: 03/27/2018 no family at bedside.  Labs   CBC: Recent Labs  Lab 03/25/18 1916 03/26/18 0317 03/27/18 0414  WBC 8.2 16.9* 16.2*  NEUTROABS 5.7  --   --   HGB 11.6* 12.2 9.7*  HCT 36.2 36.3 30.9*  MCV 87.2 87.1 88.5  PLT 249 287 093    Basic Metabolic Panel: Recent Labs  Lab 03/25/18 1916 03/26/18 0317 03/26/18 1843 03/27/18 0414 03/27/18 1204 03/28/18 0322  NA 131* 132* 136 137  --  135  K 4.1 4.1 3.3* 3.6  --  3.4*  CL 101 103 106 107  --  108  CO2 19* 15* 19* 18*  --  16*  GLUCOSE 506* 457* 148* 119*  --  242*  BUN 26* 31* 34* 38*  --  47*  CREATININE 2.54* 3.02* 3.50* 3.92*  --  3.85*  CALCIUM 9.1 9.1 8.6* 8.1*  --  7.4*  MG  --  2.2  --  1.9 2.6* 2.6*  PHOS  --  3.8  --   --  5.6* 5.5*   GFR: Estimated Creatinine Clearance: 20 mL/min (A) (by C-G formula based on SCr of 3.85 mg/dL (H)). Recent Labs  Lab 03/25/18 1916 03/25/18 2306 03/26/18 0317 03/27/18 0414  WBC 8.2  --  16.9* 16.2*  LATICACIDVEN  --  2.56*  --   --     Liver Function Tests: Recent Labs  Lab 03/25/18 1916  AST 23  ALT 11  ALKPHOS 83  BILITOT 0.9  PROT 7.6  ALBUMIN 3.5   No results for input(s): LIPASE, AMYLASE in the last 168 hours. No results for input(s): AMMONIA in the last 168 hours.  ABG    Component Value Date/Time   PHART 7.280 (L) 03/27/2018 0355   PCO2ART 40.6 03/27/2018 0355   PO2ART 110 (H) 03/27/2018 0355   HCO3 18.4 (L) 03/27/2018 0355    TCO2 20 (L) 03/26/2018 0109   ACIDBASEDEF 7.1 (H) 03/27/2018 0355   O2SAT 97.6 03/27/2018 0355     Coagulation Profile: Recent Labs  Lab 03/27/18 0414  INR 1.31    Cardiac Enzymes: No results for input(s): CKTOTAL, CKMB, CKMBINDEX, TROPONINI in the last 168 hours.  HbA1C: Hemoglobin A1C  Date/Time Value Ref Range Status  01/11/2018 03:17 PM 10.3 (A) 4.0 - 5.6 % Final  11/09/2017 03:37 PM 8.3 (A) 4.0 - 5.6 % Final   Hgb A1c MFr Bld  Date/Time Value Ref Range Status  07/26/2017 11:18 AM 12.5 (H) 4.8 - 5.6 % Final    Comment:    (NOTE) Pre diabetes:          5.7%-6.4% Diabetes:              >6.4%  Glycemic control for   <7.0% adults with diabetes   12/29/2016 02:44 PM 11.3 (H) <5.7 % Final    Comment:      For someone without known diabetes, a hemoglobin A1c value of 6.5% or greater indicates that they may have diabetes and this should be confirmed with a follow-up test.   For someone with known diabetes, a value <7% indicates that their diabetes is well controlled and a value greater than or equal to 7% indicates suboptimal control. A1c targets should be individualized based on duration of diabetes, age, comorbid conditions, and other considerations.   Currently, no consensus exists for use of hemoglobin A1c for diagnosis of diabetes for children.       CBG: Recent Labs  Lab 03/27/18 1528 03/27/18 1950 03/28/18 0002 03/28/18 0348 03/28/18 0813  GLUCAP 152* 167* 245* 238* 199*    CRITICAL CARE Performed by: Kipp Brood   Total critical care time: 35 minutes  Critical care time was exclusive of separately billable procedures and treating other patients.  Critical care was necessary to treat or prevent imminent or life-threatening deterioration.  Critical care was time spent personally by me on the following activities: development of treatment plan with patient and/or surrogate as well as nursing, discussions with consultants, evaluation of  patient's response to treatment, examination of patient, obtaining history from patient or surrogate, ordering and performing treatments and interventions, ordering and review of laboratory studies, ordering and review of radiographic studies, pulse oximetry, re-evaluation of patient's condition and participation in multidisciplinary rounds.  Kipp Brood, MD [facility] ICU Physician [place]  Pager: [phone] Mobile: [phone] After hours: [phone].  03/28/2018, 12:00 PM

## 2018-03-29 ENCOUNTER — Inpatient Hospital Stay (HOSPITAL_COMMUNITY): Payer: BLUE CROSS/BLUE SHIELD

## 2018-03-29 LAB — CBC WITH DIFFERENTIAL/PLATELET
ABS IMMATURE GRANULOCYTES: 0.05 10*3/uL (ref 0.00–0.07)
BASOS ABS: 0 10*3/uL (ref 0.0–0.1)
Basophils Relative: 0 %
Eosinophils Absolute: 0 10*3/uL (ref 0.0–0.5)
Eosinophils Relative: 0 %
HEMATOCRIT: 28.9 % — AB (ref 36.0–46.0)
HEMOGLOBIN: 9.3 g/dL — AB (ref 12.0–15.0)
IMMATURE GRANULOCYTES: 0 %
LYMPHS ABS: 1.6 10*3/uL (ref 0.7–4.0)
LYMPHS PCT: 14 %
MCH: 28.5 pg (ref 26.0–34.0)
MCHC: 32.2 g/dL (ref 30.0–36.0)
MCV: 88.7 fL (ref 80.0–100.0)
Monocytes Absolute: 0.7 10*3/uL (ref 0.1–1.0)
Monocytes Relative: 6 %
NEUTROS ABS: 9.3 10*3/uL — AB (ref 1.7–7.7)
NEUTROS PCT: 80 %
NRBC: 0 % (ref 0.0–0.2)
Platelets: 206 10*3/uL (ref 150–400)
RBC: 3.26 MIL/uL — ABNORMAL LOW (ref 3.87–5.11)
RDW: 14.8 % (ref 11.5–15.5)
WBC: 11.7 10*3/uL — ABNORMAL HIGH (ref 4.0–10.5)

## 2018-03-29 LAB — BASIC METABOLIC PANEL
Anion gap: 10 (ref 5–15)
BUN: 57 mg/dL — AB (ref 6–20)
CALCIUM: 8 mg/dL — AB (ref 8.9–10.3)
CO2: 15 mmol/L — ABNORMAL LOW (ref 22–32)
CREATININE: 3.25 mg/dL — AB (ref 0.44–1.00)
Chloride: 112 mmol/L — ABNORMAL HIGH (ref 98–111)
GFR calc non Af Amer: 16 mL/min — ABNORMAL LOW (ref >60)
GFR, EST AFRICAN AMERICAN: 19 mL/min — AB (ref >60)
Glucose, Bld: 255 mg/dL — ABNORMAL HIGH (ref 70–99)
Potassium: 3.7 mmol/L (ref 3.5–5.1)
Sodium: 137 mmol/L (ref 135–145)

## 2018-03-29 LAB — GLUCOSE, CAPILLARY
GLUCOSE-CAPILLARY: 195 mg/dL — AB (ref 70–99)
GLUCOSE-CAPILLARY: 204 mg/dL — AB (ref 70–99)
Glucose-Capillary: 213 mg/dL — ABNORMAL HIGH (ref 70–99)
Glucose-Capillary: 140 mg/dL — ABNORMAL HIGH (ref 70–99)
Glucose-Capillary: 149 mg/dL — ABNORMAL HIGH (ref 70–99)
Glucose-Capillary: 211 mg/dL — ABNORMAL HIGH (ref 70–99)

## 2018-03-29 LAB — CSF CULTURE W GRAM STAIN: Culture: NO GROWTH

## 2018-03-29 LAB — CSF CULTURE

## 2018-03-29 MED ORDER — CARVEDILOL 12.5 MG PO TABS
12.5000 mg | ORAL_TABLET | Freq: Two times a day (BID) | ORAL | Status: DC
  Administered 2018-03-29 – 2018-03-31 (×4): 12.5 mg
  Filled 2018-03-29 (×4): qty 1

## 2018-03-29 MED ORDER — PRO-STAT SUGAR FREE PO LIQD
30.0000 mL | Freq: Two times a day (BID) | ORAL | Status: DC
  Administered 2018-03-29 – 2018-04-01 (×7): 30 mL
  Filled 2018-03-29 (×7): qty 30

## 2018-03-29 MED ORDER — JEVITY 1.2 CAL PO LIQD
1000.0000 mL | ORAL | Status: DC
  Administered 2018-03-29 – 2018-04-01 (×2): 1000 mL
  Filled 2018-03-29 (×7): qty 1000

## 2018-03-29 MED ORDER — HYDRALAZINE HCL 20 MG/ML IJ SOLN
5.0000 mg | INTRAMUSCULAR | Status: DC | PRN
  Administered 2018-03-30 – 2018-04-01 (×6): 10 mg via INTRAVENOUS
  Administered 2018-04-02: 5 mg via INTRAVENOUS
  Filled 2018-03-29 (×8): qty 1

## 2018-03-29 MED ORDER — METOPROLOL TARTRATE 5 MG/5ML IV SOLN
5.0000 mg | Freq: Three times a day (TID) | INTRAVENOUS | Status: DC
  Administered 2018-03-29: 5 mg via INTRAVENOUS
  Filled 2018-03-29: qty 5

## 2018-03-29 MED ORDER — NICARDIPINE HCL IN NACL 20-0.86 MG/200ML-% IV SOLN: 3.0000 mg/h | INTRAVENOUS | Status: DC

## 2018-03-29 MED ORDER — VANCOMYCIN HCL IN DEXTROSE 1-5 GM/200ML-% IV SOLN: 1000.0000 mg | INTRAVENOUS | Status: DC

## 2018-03-29 MED ORDER — LEVETIRACETAM 100 MG/ML PO SOLN
500.0000 mg | Freq: Two times a day (BID) | ORAL | Status: DC
  Administered 2018-03-29 – 2018-04-01 (×6): 500 mg
  Filled 2018-03-29 (×6): qty 5

## 2018-03-29 MED ORDER — AMLODIPINE BESYLATE 10 MG PO TABS
10.0000 mg | ORAL_TABLET | Freq: Every day | ORAL | Status: DC
  Administered 2018-03-29 – 2018-04-01 (×4): 10 mg
  Filled 2018-03-29 (×4): qty 1

## 2018-03-29 NOTE — Procedures (Signed)
[facility]  Person Inserting Tube:  Christina Orozco, RD Tube Location:  Right nare Initial Placement:  Postpyloric Secured by: Bridle Technique Used to Measure Tube Placement:  Documented cm marking at nare/ corner of mouth Cort[facility] Secured At:  79 cm Procedure Comments:    No x-ray is required. RN may begin using tube.   If the tube becomes dislodged please keep the tube and contact the [facility] team at [email or web address] ([email or web address]) for replacement.  If after hours and replacement cannot be delayed, place a NG tube and confirm placement with an abdominal x-ray.   Christina Orozco RD, LDN Clinical Nutrition Pager # [phone]

## 2018-03-29 NOTE — Progress Notes (Addendum)
Subjective: Patient in bed asleep, intubated, not sedated, NAD. Drowsy after awakening to stimulation.  Exam: Vitals:   03/29/18 0700 03/29/18 0747  BP: (!) 160/68 (!) 160/68  Pulse: 93 96  Resp: 15 14  Temp:    SpO2: 100% 99%   Gen: In bed, intubated Resp: non-labored breathing, no acute distress Abd: soft, nt  Neuro: MS: She awakens to stimulation, but appears to be more difficult to arouse than with previous exams (per nursing and prior neurology note), able to follow commands  CN: EOMI, PERRL, Motor: She lifts all 4 extremities out of bed against gravity, but does not give good effort, no clear asymmetry  sensory: She endorses symmetric sensation to light touch Reflexes: BLE: 3+ knee jerk BUE: 3+ brachial  Pertinent Labs/diagnostic:  Elevated creatinine at 3.25, GFR 19 HSV: negative CSF culture: no growth at 2 days CSF fungal: pending  Assessment:  47 year old female with new onset seizures in the setting of ? CNS infection.   1. There is no definite evidence of posterior reversible encephalopathy syndrome on MRI, though the changes in the pons could be suspicious for this.  2. Hypertension contributing to the onset of the seizures with hypertensive encephalopathy causing AMS is relatively high on the DDx.  3. Also possible that it was a hypoglycemia-induced seizure.   4. Of note, the CSF pleocytosis is higher than one would typically expect to see from seizures alone, but lower than expected for a bacterial meningitis. Fungal or viral meningitis is therefore a relatively prominent differential diagnostic consideration.  5. Initial routine EEG was normal.  Recommendations: -- Continue Keppra 500 mg twice daily (at maximum dose due to her renal function) -- Repeat routine EEG d/t waxing and waning of LOC has been ordered -- Neurology will continue to follow  Addendum: EEG was normal in the awake and asleep states. There were no focal lateralizing or epileptiform  features.  Laurey Morale, MSN, NP-C [facility] [phone]   Electronically signed: Dr. Kerney Elbe

## 2018-03-29 NOTE — Progress Notes (Signed)
Follow up - Critical Care Medicine Note  Patient Details:    Christina Orozco is an 47 y.o. female presented with new onset seizures.  Christina Orozco has remained encephalopathic.  CSF examination suggestive of bacterial meningitis although Gram stain and cultures are negative.  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   Subjective:    Overnight Issues: Patient is increasingly awake with a strong cough and minimal secretions.  Objective:  Vital signs, hemodynamic parameters, intake and output and ventilator parameters personally reviewed.  Physical Exam:  Physical Exam  Constitutional: Christina Orozco appears lethargic. Christina Orozco is cooperative.  Morbidly obese  Eyes: Pupils are equal, round, and reactive to light.  Cardiovascular: Normal rate and regular rhythm.  Pulmonary/Chest: Effort normal.  Tolerated pressure support ventilation.  Normal respiratory effort following extubation.  Abdominal: Soft.  Neurological: Christina Orozco appears lethargic.  Christina Orozco intermittently follows commands.  And is begun to speak post extubation.  Christina Orozco moves all limbs symmetrically.     Ancillary testing (personally reviewed):  Improving leukocytosis.  Stable anemia acute illness.  Mild hyper pleuritic metabolic acidosis with improving renal function.  Improving glycemic control. CSF cultures are negative at 3 days Assessment/Plan:   Assessment: Remains critically ill due to acute respiratory failure.  Acceptable weaning parameters and adequate respiratory protective mechanisms in spite of impaired sensorium. Acute metabolic encephalopathy likely secondary to PRES Possible bacterial meningitis effectively ruled out with negative cultures Severe hypertension-improved on home antihypertensive regimen. Stage III kidney disease likely secondary to hypertensive nephropathy. Worsening hyperchloremic acidosis  Plan: Successfully extubated.  Remains at high risk for reintubation given poor mental status.  Continue  close observation.  Consider ABG if becomes more somnolent. Slowly improving metabolic encephalopathy.  Continue to monitor and minimize sedation. Will discontinue antibiotics given negative cultures. Well-controlled hypertension.  Will insert small bore feeding tube to administer medications and to continue nutrition. We will discontinue normal saline   Critical Care Total Time*: 30 Minutes  Jerzi Tigert 03/29/2018  *Care during the described time interval was provided by me alone.  I have reviewed this patient's available data, including medical history, events of note, physical examination and test results as part of my evaluation.

## 2018-03-29 NOTE — Procedures (Signed)
Extubation Procedure Note  Patient Details:   Name: Christina Orozco DOB: Mar 21, 1971 MRN: [id number]   Airway Documentation:    Vent end date: 03/29/18 Vent end time: 0915   Evaluation  O2 sats: stable throughout Complications: No apparent complications Patient did tolerate procedure well. Bilateral Breath Sounds: Diminished   Yes   Patient was extubated to a 3L [place] without any complications, dyspnea or stridor noted.   Jamyra Zweig L 03/29/2018, 9:15 AM

## 2018-03-29 NOTE — Progress Notes (Addendum)
NAME:  Christina Orozco, MRN:  [id number], DOB:  1971/03/31, LOS: 4 ADMISSION DATE:  03/25/2018, CONSULTATION DATE:  03/25/2018 REFERRING MD:  Dr. Billy Fischer, ER CHIEF COMPLAINT:  Seizure   Brief History   47 yo female with multiple seizures in setting of HTN emergency and hyperglycemia.  Intubated for airway protection.   Past Medical History  CVA, HTN, DM, GERD, CKD 2, Asthma, Anemia, Neuropathy  Significant Hospital Events   10/14 admit  Consults: date of consult/date signed off & final recs:  Neurology 10/14 for seizures   Procedures (surgical and bedside):  ETT 10/14 >> 10/18 LP 10/15 >>   Significant Diagnostic Tests:  CT head 10/14 >> no acute findings CT C-spine 10/14 >> no acute cervical abnormality, history C5-C6 surgical fusion, nasopharyngeal soft tissue swelling CT angiography 10/14 >> patent carotid, vertebral arteries, no dissection, no aneurysm EEG 10/14 >> no active seizures on propofol   Micro Data:  CSF 10/15 >> WBC 30, differential pending >>  03/26/2018 blood culture>> negative 10/15 CSF remains negative.  10/15 HSV PCR is negative.  Antimicrobials:  Acyclovir 10/14 >>10/17  Meropenem 10/15 >>  Zosyn 10/15 >>   Subjective:  She is tired. She has been extubated to 2 L [place]. She is following commands  Objective   Blood pressure (!) 144/64, pulse 85, temperature 99.6 F (37.6 C), temperature source Axillary, resp. rate 18, height 5\' 4"  (1.626 m), weight 92 kg, last menstrual period 03/12/2018, SpO2 100 %.    Vent Mode: PSV;CPAP FiO2 (%):  [30 %] 30 % Set Rate:  [16 bmp] 16 bmp Vt Set:  [410 mL] 410 mL PEEP:  [5 cmH20] 5 cmH20 Pressure Support:  [8 cmH20] 8 cmH20 Plateau Pressure:  [15 cmH20-19 cmH20] 19 cmH20   Intake/Output Summary (Last 24 hours) at 03/29/2018 0947 Last data filed at 03/29/2018 0800 Gross per 24 hour  Intake 3497.2 ml  Output 875 ml  Net 2622.2 ml   Filed Weights   03/25/18 2357 03/28/18 0500 03/29/18 0600  Weight:  85.2 kg 88.3 kg 92 kg    Examination:  General: Obese female no acute distress, on 2L [place] with sats of 99% HEENT:NCAT, No LAD, Lipoma to right forehead Neuro: Follows commands weakly in all limbs, but weakest in LUE, Lid apraxia. Tired, speaks softly when she speaks CV: S1, S2, RRR, no MRG  extremities are warm , brisk refill PULM: Bilateral chest excursion, lungs bilaterally decreased in bases.  OF:BPZW, non-tender, bowel sound are present. Obese Extremities: warm/dry, 1+ edema , no obvious deformities Skin: no rashes or lesions, warm dry and intact   Assessment & Plan:  Acute encephalopathy. With Recurrent seizures. Suspected meningitis based on early CSF results, but pleiocytosis may be due to seizures instead. Hx of CVA, and MRI shows microvascular changes, PRES or variant may be more likely cause. Plan Continue Keppra for seizure prevention, monitor for recurrent seizure activity.  Minimize sedation. Have added enteral pain control options.  Decrease frequency of neurochecks to encourage sleep. Acyclovir stopped and will stop antibiotics if cultures remain negative.  HTN emergency, blood pressure currently improving. Hx of HLD. Plan: Continue amlodipine, carvedilol and hydralazine and titrate to achieve SBP<140. Keep SBP<180 acutely. Now extubated will switch to IV dosing until more awake and able to swallow. May need Cor Track for meds  Acute respiratory failure with hypoxia from compromised airway. Extubated 10/18 but remains encephalopathic Currently protecting airway Hx of asthma. Plan Extubate Continue to monitor for desaturation as we continue to  hydrate for worsening renal function. Prn albuterol Trend CXR   Hyperglycemia with hx of DM. CBG (last 3)  Recent Labs    03/28/18 2317 03/29/18 0329 03/29/18 0742  GLUCAP 148* 213* 195*  Plan Increased basal insulin and SSI for poorly controlled hyperglycemia. CBG's Q 4  Acute renal failure, suspect from  volume depletion. Anion gap metabolic acidosis CKD 2. Creatinine slowly down trending Lab Results  Component Value Date   CREATININE 3.25 (H) 03/29/2018   CREATININE 3.85 (H) 03/28/2018   CREATININE 3.92 (H) 03/27/2018   CREATININE 1.73 (H) 04/23/2017   CREATININE 1.33 (H) 11/21/2016   CREATININE 1.29 (H) 10/06/2016   Recent Labs  Lab 03/27/18 0414 03/28/18 0322 03/29/18 0324  K 3.6 3.4* 3.7  Plan: Trend BMET Improving with fluid administration. Will stop iv fluids once creatinine clearly improving or oxygenation becomes a problem Replete electrolytes as needed + 11 L >> high risk for vascular congestion  Nutrition Groggy/ Encephalopathic  For Swallow eval 10/18 Plan: If fails swallow, place Cortrack and resume previous feeds  Anemia Plan: Trend CBC Monitor for bleeding   Disposition / Summary of Today's Plan 03/29/18   Optimize BP control.>> Meds to IV as unable to tale PO May need CorTrack for meds and nutrition if she remains encephalopathic and does not pass swallow. Nursing will place order for Cortrack if patient does not pass swallow T Max 100.4>> WBC down trending Monitor neurological status.    Diet: 03/29/2018>> NPO until passes swallow or TF DVT prophylaxis: SQ heparin GI prophylaxis: Protonix Mobility: Bed rest Code Status: full code Family Communication: 03/27/2018 no family at bedside.  Labs   CBC: Recent Labs  Lab 03/25/18 1916 03/26/18 0317 03/27/18 0414 03/29/18 0324  WBC 8.2 16.9* 16.2* 11.7*  NEUTROABS 5.7  --   --  9.3*  HGB 11.6* 12.2 9.7* 9.3*  HCT 36.2 36.3 30.9* 28.9*  MCV 87.2 87.1 88.5 88.7  PLT 249 287 201 644    Basic Metabolic Panel: Recent Labs  Lab 03/26/18 0317 03/26/18 1843 03/27/18 0414 03/27/18 1204 03/28/18 0322 03/28/18 1707 03/29/18 0324  NA 132* 136 137  --  135  --  137  K 4.1 3.3* 3.6  --  3.4*  --  3.7  CL 103 106 107  --  108  --  112*  CO2 15* 19* 18*  --  16*  --  15*  GLUCOSE 457* 148*  119*  --  242*  --  255*  BUN 31* 34* 38*  --  47*  --  57*  CREATININE 3.02* 3.50* 3.92*  --  3.85*  --  3.25*  CALCIUM 9.1 8.6* 8.1*  --  7.4*  --  8.0*  MG 2.2  --  1.9 2.6* 2.6* 2.6*  --   PHOS 3.8  --   --  5.6* 5.5* 3.5  --    GFR: Estimated Creatinine Clearance: 24.3 mL/min (A) (by C-G formula based on SCr of 3.25 mg/dL (H)). Recent Labs  Lab 03/25/18 1916 03/25/18 2306 03/26/18 0317 03/27/18 0414 03/29/18 0324  WBC 8.2  --  16.9* 16.2* 11.7*  LATICACIDVEN  --  2.56*  --   --   --     Liver Function Tests: Recent Labs  Lab 03/25/18 1916  AST 23  ALT 11  ALKPHOS 83  BILITOT 0.9  PROT 7.6  ALBUMIN 3.5   No results for input(s): LIPASE, AMYLASE in the last 168 hours. No results for input(s): AMMONIA in  the last 168 hours.  ABG    Component Value Date/Time   PHART 7.280 (L) 03/27/2018 0355   PCO2ART 40.6 03/27/2018 0355   PO2ART 110 (H) 03/27/2018 0355   HCO3 18.4 (L) 03/27/2018 0355   TCO2 20 (L) 03/26/2018 0109   ACIDBASEDEF 7.1 (H) 03/27/2018 0355   O2SAT 97.6 03/27/2018 0355     Coagulation Profile: Recent Labs  Lab 03/27/18 0414  INR 1.31    Cardiac Enzymes: No results for input(s): CKTOTAL, CKMB, CKMBINDEX, TROPONINI in the last 168 hours.  HbA1C: Hemoglobin A1C  Date/Time Value Ref Range Status  01/11/2018 03:17 PM 10.3 (A) 4.0 - 5.6 % Final  11/09/2017 03:37 PM 8.3 (A) 4.0 - 5.6 % Final   Hgb A1c MFr Bld  Date/Time Value Ref Range Status  07/26/2017 11:18 AM 12.5 (H) 4.8 - 5.6 % Final    Comment:    (NOTE) Pre diabetes:          5.7%-6.4% Diabetes:              >6.4% Glycemic control for   <7.0% adults with diabetes   12/29/2016 02:44 PM 11.3 (H) <5.7 % Final    Comment:      For someone without known diabetes, a hemoglobin A1c value of 6.5% or greater indicates that they may have diabetes and this should be confirmed with a follow-up test.   For someone with known diabetes, a value <7% indicates that their diabetes is well  controlled and a value greater than or equal to 7% indicates suboptimal control. A1c targets should be individualized based on duration of diabetes, age, comorbid conditions, and other considerations.   Currently, no consensus exists for use of hemoglobin A1c for diagnosis of diabetes for children.       CBG: Recent Labs  Lab 03/28/18 1548 03/28/18 1952 03/28/18 2317 03/29/18 0329 03/29/18 0742  GLUCAP 255* 107* 148* 213* [place], [facility]  Pager: [phone] After hours: [phone].  03/29/2018, 9:47 AM

## 2018-03-29 NOTE — Progress Notes (Signed)
Pt is agitated, goes back and forth from Breath stacking to neuro breathing. RR is in the 40's at this time.  [facility]/CPAP. 8/5 30%. Pt's RR is down to 18 and pt looks comfortable at this time.  Will continue to monitor.

## 2018-03-29 NOTE — Progress Notes (Signed)
Nutrition Follow-up  DOCUMENTATION CODES:   Obesity unspecified  INTERVENTION:   Jevity 1.2 @ 55 via Cortrak  30 ml Prostat BID  Provides: 1784 kcal, 103 grams protein, and 1070 ml free water.   D/C Vital High Protein   NUTRITION DIAGNOSIS:   Inadequate oral intake related to inability to eat as evidenced by NPO status. Ongoing.   GOAL:   Patient will meet greater than or equal to 90% of their needs Met.   MONITOR:   Labs, TF tolerance  ASSESSMENT:   Pt with PMH of HTN, stroke, stage IV CKD, severe anemia, and DM admitted after multiple seizures in the setting of HTN emergency and hyperglycemia. Working diagnosis of bacterial meningitis.    10/18 extubated, Cortrak tube placed (tip post-pyloric per Cortrak reading) Pt unable to answer any questions. Discussed with RN.   Medications reviewed and include: novolog SSI every 4 hours, levemir 10 units every 12 hours  Labs reviewed: Cr 3.25 Lab Results  Component Value Date   HGBA1C 10.3 (A) 01/11/2018   CBG (last 3)  Recent Labs    03/29/18 0329 03/29/18 0742 03/29/18 1116  GLUCAP 213* 195* 140*    Diet Order:   Diet Order            Diet NPO time specified  Diet effective now              EDUCATION NEEDS:   No education needs have been identified at this time  Skin:  Skin Assessment: Reviewed RN Assessment  Last BM:  unknown  Height:   Ht Readings from Last 1 Encounters:  03/25/18 _0  (1.626 m)    Weight:   Wt Readings from Last 1 Encounters:  03/29/18 92 kg    Ideal Body Weight:  54.5 kg  BMI:  Body mass index is 34.81 kg/m.  Estimated Nutritional Needs:   Kcal:  1700-1900  Protein:  90-100 grams  Fluid:  > 1.7 L/day  Maylon Peppers RD, LDN, CNSC [phone] Pager [phone] After Hours Pager

## 2018-03-29 NOTE — Progress Notes (Signed)
Bedside EEG completed; results pending. 

## 2018-03-29 NOTE — Procedures (Signed)
ELECTROENCEPHALOGRAM REPORT   Patient: Christina Orozco       Room #: 4N28C EEG No. ID: [id number] Age: 47 y.o.        Sex: female Referring Physician: Byrum Report Date:  03/29/2018        Interpreting Physician: Alexis Goodell  History: [name] is an 47 y.o. female with new onset seizure activity  Medications:  Cardene, Vancomycin, Lopressor, Merrem, Keppra, Insulin, Plavix, Lipitor  Conditions of Recording:  This is a 21 channel routine scalp EEG performed with bipolar and monopolar montages arranged in accordance to the international 10/20 system of electrode placement. One channel was dedicated to EKG recording.  The patient is in the awake, drowsy and asleep states.  Description:  The waking background activity consists of a low voltage, symmetrical, fairly well organized, 9-10 Hz alpha activity, seen from the parieto-occipital and posterior temporal regions.  Low voltage fast activity, poorly organized, is seen anteriorly and is at times superimposed on more posterior regions.  A mixture of theta and alpha rhythms are seen from the central and temporal regions. The patient drowses with slowing to irregular, low voltage theta and beta activity.   The patient goes in to a light sleep with symmetrical sleep spindles, vertex central sharp transients and irregular slow activity.  Hyperventilation and intermittent photic stimulation were not performed.   IMPRESSION: Normal electroencephalogram, awake and asleep. There are no focal lateralizing or epileptiform features.   Alexis Goodell, MD Neurology [phone] 03/29/2018, 12:48 PM

## 2018-03-29 NOTE — Progress Notes (Signed)
Pharmacy Antibiotic Note  Christina Orozco is a 47 y.o. female admitted on 03/25/2018 with seizures.  Pharmacy has been consulted for vancomycin and Merrem dosing for rule out meningitis.   Renal function is starting to improve. Tmax 100.4, WBC trending down.  Plan: Change vanc to 1gm IV Q24H Continue Merrem 1gm IV Q12H Monitor renal fxn, clinical progress, vanc trough at Css   Height: 5\' 4"  (162.6 cm) Weight: 202 lb 13.2 oz (92 kg) IBW/kg (Calculated) : 54.7  Temp (24hrs), Avg:99.8 F (37.7 C), Min:99.3 F (37.4 C), Max:100.4 F (38 C)  Recent Labs  Lab 03/25/18 1916 03/25/18 2306 03/26/18 0317 03/26/18 1843 03/27/18 0414 03/28/18 0322 03/29/18 0324  WBC 8.2  --  16.9*  --  16.2*  --  11.7*  CREATININE 2.54*  --  3.02* 3.50* 3.92* 3.85* 3.25*  LATICACIDVEN  --  2.56*  --   --   --   --   --     Estimated Creatinine Clearance: 24.3 mL/min (A) (by C-G formula based on SCr of 3.25 mg/dL (H)).    Allergies  Allergen Reactions  . Lisinopril Swelling and Other (See Comments)    Facial swelling   . Penicillins Hives    Has patient had a PCN reaction causing immediate rash, facial/tongue/throat swelling, SOB or lightheadedness with hypotension: yes Has patient had a PCN reaction causing severe rash involving mucus membranes or skin necrosis: No Has patient had a PCN reaction that required hospitalization No Has patient had a PCN reaction occurring within the last 10 years: No If all of the above answers are "NO", then may proceed wit  . Gabapentin Swelling and Other (See Comments)    "I thought I was having another stroke."      Vanc 10/15 >> Merrem 10/15 >> Acyclovir 10/15 >> 10/17  10/15 MRSA PCR - negative 10/15 CSF fungus -  10/15 CSF - NGTD 10/15 BCx - NGTD   Jisselle Poth D. Mina Marble, PharmD, BCPS, [place] 03/29/2018, 9:48 AM

## 2018-03-30 ENCOUNTER — Inpatient Hospital Stay (HOSPITAL_COMMUNITY): Payer: BLUE CROSS/BLUE SHIELD

## 2018-03-30 LAB — CBC WITH DIFFERENTIAL/PLATELET
Abs Immature Granulocytes: 0.02 10*3/uL (ref 0.00–0.07)
Basophils Absolute: 0 10*3/uL (ref 0.0–0.1)
Basophils Relative: 1 %
EOS PCT: 2 %
Eosinophils Absolute: 0.2 10*3/uL (ref 0.0–0.5)
HEMATOCRIT: 30 % — AB (ref 36.0–46.0)
HEMOGLOBIN: 9.5 g/dL — AB (ref 12.0–15.0)
Immature Granulocytes: 0 %
LYMPHS ABS: 2.3 10*3/uL (ref 0.7–4.0)
LYMPHS PCT: 28 %
MCH: 28.1 pg (ref 26.0–34.0)
MCHC: 31.7 g/dL (ref 30.0–36.0)
MCV: 88.8 fL (ref 80.0–100.0)
MONO ABS: 0.7 10*3/uL (ref 0.1–1.0)
MONOS PCT: 8 %
Neutro Abs: 5 10*3/uL (ref 1.7–7.7)
Neutrophils Relative %: 61 %
Platelets: 246 10*3/uL (ref 150–400)
RBC: 3.38 MIL/uL — ABNORMAL LOW (ref 3.87–5.11)
RDW: 14.7 % (ref 11.5–15.5)
WBC: 8.3 10*3/uL (ref 4.0–10.5)
nRBC: 0 % (ref 0.0–0.2)

## 2018-03-30 LAB — GLUCOSE, CAPILLARY
GLUCOSE-CAPILLARY: 250 mg/dL — AB (ref 70–99)
Glucose-Capillary: 141 mg/dL — ABNORMAL HIGH (ref 70–99)
Glucose-Capillary: 168 mg/dL — ABNORMAL HIGH (ref 70–99)
Glucose-Capillary: 174 mg/dL — ABNORMAL HIGH (ref 70–99)
Glucose-Capillary: 178 mg/dL — ABNORMAL HIGH (ref 70–99)
Glucose-Capillary: 199 mg/dL — ABNORMAL HIGH (ref 70–99)

## 2018-03-30 LAB — BASIC METABOLIC PANEL
Anion gap: 6 (ref 5–15)
BUN: 55 mg/dL — ABNORMAL HIGH (ref 6–20)
CHLORIDE: 118 mmol/L — AB (ref 98–111)
CO2: 20 mmol/L — ABNORMAL LOW (ref 22–32)
CREATININE: 2.42 mg/dL — AB (ref 0.44–1.00)
Calcium: 8.6 mg/dL — ABNORMAL LOW (ref 8.9–10.3)
GFR calc Af Amer: 27 mL/min — ABNORMAL LOW (ref >60)
GFR calc non Af Amer: 23 mL/min — ABNORMAL LOW (ref >60)
Glucose, Bld: 171 mg/dL — ABNORMAL HIGH (ref 70–99)
POTASSIUM: 3.4 mmol/L — AB (ref 3.5–5.1)
Sodium: 144 mmol/L (ref 135–145)

## 2018-03-30 MED ORDER — HYDRALAZINE HCL 50 MG PO TABS
50.0000 mg | ORAL_TABLET | Freq: Four times a day (QID) | ORAL | Status: DC
  Administered 2018-03-30 – 2018-04-01 (×9): 50 mg
  Filled 2018-03-30 (×9): qty 1

## 2018-03-30 MED ORDER — HEPARIN SODIUM (PORCINE) 5000 UNIT/ML IJ SOLN
5000.0000 [IU] | Freq: Three times a day (TID) | INTRAMUSCULAR | Status: DC
  Administered 2018-03-30 – 2018-04-02 (×10): 5000 [IU] via SUBCUTANEOUS
  Filled 2018-03-30 (×10): qty 1

## 2018-03-30 MED ORDER — POTASSIUM CHLORIDE 20 MEQ/15ML (10%) PO SOLN
40.0000 meq | Freq: Once | ORAL | Status: AC
  Administered 2018-03-30: 40 meq
  Filled 2018-03-30: qty 30

## 2018-03-30 MED ORDER — INSULIN ASPART 100 UNIT/ML ~~LOC~~ SOLN
0.0000 [IU] | SUBCUTANEOUS | Status: DC
  Administered 2018-03-30: 4 [IU] via SUBCUTANEOUS
  Administered 2018-03-30: 3 [IU] via SUBCUTANEOUS
  Administered 2018-03-30: 7 [IU] via SUBCUTANEOUS
  Administered 2018-03-31 (×3): 4 [IU] via SUBCUTANEOUS
  Administered 2018-03-31: 3 [IU] via SUBCUTANEOUS
  Administered 2018-03-31 (×2): 4 [IU] via SUBCUTANEOUS
  Administered 2018-04-01: 7 [IU] via SUBCUTANEOUS
  Administered 2018-04-01: 3 [IU] via SUBCUTANEOUS
  Administered 2018-04-01: 4 [IU] via SUBCUTANEOUS
  Administered 2018-04-01: 3 [IU] via SUBCUTANEOUS

## 2018-03-30 NOTE — Progress Notes (Signed)
LTM running - push button tested - RN notified.

## 2018-03-30 NOTE — Progress Notes (Signed)
LTM EEG reviewed. No definite electrographic seizures seen. The background is symmetric, consistent of alpha and beta activity. A small number of short-duration bursts of higher amplitude slow wave activity are seen, without significant change in the background rhythm following the slow wave activity.   Electronically signed: Dr. Kerney Elbe

## 2018-03-30 NOTE — Progress Notes (Signed)
EEG wire is off on patient. EEG notified and message left. RN to continue to monitor

## 2018-03-30 NOTE — Progress Notes (Addendum)
Subjective: LTM running, patient awake alert, NGT in place. NAD on RA. Patient in soft mitten restraints bilaterally.   Exam: Vitals:   03/30/18 1400 03/30/18 1600  BP: (!) 149/85   Pulse: 92   Resp: (!) 22   Temp:  98.3 F (36.8 C)  SpO2: 97%    Physical Exam  HEENT-  Normocephalic, no lesions, without obvious abnormality.  Normal external eye and conjunctiva.   Cardiovascular- S1-S2 audible, pulses palpable throughout   Lungs-no rhonchi or wheezing noted, no excessive working breathing.  Saturations within normal limits on RA Abdomen- All 4 quadrants palpated and nontender Extremities- Warm, dry and intact Musculoskeletal-no joint tenderness, deformity or swelling Skin-warm and dry, no hyperpigmentation, vitiligo, or suspicious lesions  Neurological Examination Mental Status: Alert, oriented name/ age/month/year/place, thought content appropriate.  Speech fluent without evidence of aphasia.  Able to follow  commands without difficulty. Cranial Nerves: II:  Visual fields grossly normal,  III,IV, VI: ptosis not present, extra-ocular motions intact bilaterally pupils equal, round, reactive to light and accommodation V,VII: smile symmetric, facial light touch sensation normal bilaterally VIII: hearing normal bilaterally IX,X: uvula rises symmetrically XI: bilateral shoulder shrug XII: midline tongue extension Motor: Right : Upper extremity   5/5  Left:     Upper extremity   5/5  Lower extremity   5/5   Lower extremity   5/5 Tone and bulk:normal tone throughout; no atrophy noted Sensory:  light touch intact throughout, bilaterally Deep Tendon Reflexes: 2+ ankle jerk, 3 + knee jerk and patella Plantars: Right: downgoing   Left: downgoing   Pertinent Labs/diagnostic: Routine EEG 03-29-18: Normal in the awake and asleep states. There were no focal lateralizing or epileptiform features.  LTM day 1: read pending  Elevated creatinine at 3.25, GFR 19 HSV: negative CSF culture:  no growth at 2 days Fungal culture with stain: pending Fungal culture: no fungus observed  Assessment:  47 year old female with new onset seizures in the setting of ? CNS infection.   1. There is no definite evidence of posterior reversible encephalopathy syndrome on MRI, though the changes in the pons could be suspicious for this.  2. Hypertension contributing to the onset of the seizures, with hypertensive encephalopathy causing AMS, is relatively high on the DDx.  3. Also possible that it was a hypoglycemia-induced seizure.   4. Of note, the CSF pleocytosis is higher than one would typically expect to see from seizures alone, but lower than expected for a bacterial meningitis. Fungal or viral meningitis is therefore a relatively prominent differential diagnostic consideration.  5. LTM running. Prelilminary assessment reveals no definite electrographic seizures  Recommendations: -- Continue Keppra 500 mg twice daily (at maximum dose due to her renal impairment) -- LTM -- Neurology will continue to follow  Laurey Morale, MSN, NP-C [facility] [phone]   Electronically signed: Dr. Kerney Elbe

## 2018-03-30 NOTE — Progress Notes (Signed)
NAME:  Mckynzie Liwanag, MRN:  [id number], DOB:  05/26/1971, LOS: 5 ADMISSION DATE:  03/25/2018, CONSULTATION DATE:  10/18 REFERRING MD: byrum , CHIEF COMPLAINT: Acute encephalopathy  Brief History   Lyna Laningham is an 47 y.o. female presented with new onset seizures.  She has remained encephalopathic.  CSF examination suggestive of bacterial meningitis although Gram stain and cultures are negative.  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  Past Medical History  CVA, hypertension, diabetes, GERD, CKD stage II, asthma, anemia, and neuropathy. Significant Hospital Events   10/14 admitted 10/14 through 10/18: Continued supportive care, EEG completed.  Seizure activity.  LP suggesting possible meningitis.  Most recent EEG negative for seizures.Extubated successfully on 10/18; feeding tube placed and has failed nursing screening for bedside swallow 10/19: Much more awake, intermittently confused but oriented to time, place, time, and reason for admission blood pressure still not at goal greater than 160, glycemic control still poor.  Most recent EEG on the 18th was a normal EEG without seizures or epileptiform activity   Consults: date of consult/date signed off & final recs:  Neurology  Procedures (surgical and bedside):  ETT 10/14 >> 10/18 LP 10/15   Significant Diagnostic Tests:  CT head 10/14 >> no acute findings CT C-spine 10/14 >> no acute cervical abnormality, history C5-C6 surgical fusion, nasopharyngeal soft tissue swelling CT angiography 10/14 >> patent carotid, vertebral arteries, no dissection, no aneurysm EEG 10/14 >> no active seizures on propofol  Micro Data:  CSF 10/15 >> WBC 30, differential pending >>  03/26/2018 blood culture>> negative 10/15 CSF remains negative.  10/15 HSV PCR is negative.  Antimicrobials:  Acyclovir 10/14 >>10/17  Meropenem 10/15 >>  discontinued Zosyn 10/15 >> discontinued  Subjective:  No distress, no  shortness of breath or chest discomfort  Objective   Blood pressure (Abnormal) 162/77, pulse 87, temperature 98.3 F (36.8 C), temperature source Oral, resp. rate 17, height 5\' 4"  (1.626 m), weight 96.7 kg, last menstrual period 03/12/2018, SpO2 96 %.        Intake/Output Summary (Last 24 hours) at 03/30/2018 1254 Last data filed at 03/30/2018 1200 Gross per 24 hour  Intake 1221.92 ml  Output 1100 ml  Net 121.92 ml   Filed Weights   03/28/18 0500 03/29/18 0600 03/30/18 0416  Weight: 88.3 kg 92 kg 96.7 kg    Examination: General: 47 year old female patient resting in bed she is in no acute distress remains intermittently and encephalopathic HENT: Normocephalic atraumatic no jugular venous distention Lungs: Clear to auscultation Cardiovascular: Regular rate and rhythm Abdomen: Soft nontender  Extremities: Warm and dry Neuro: Awake oriented no focal deficits, intermittently confused or inappropriate but able to be reoriented GU: Voiding  Resolved Hospital Problem list   Respiratory failure successfully extubated 10/18   Assessment & Plan:   Acute metabolic encephalopathy: Primary differential diagnosis includes press versus possible CNS infection (NS infection seems unlikely given negative CSF) -Neurology following -CSF negative so antibiotics discontinued Plan Ensure systolic blood pressure less than 160 Supportive care Mobilization  Seizures: Now resolved Plan Continue Keppra EEG monitoring per neurology  Severe hypertension Plan Continuing antihypertensive regimen including Norvasc, Coreg, and adding scheduled hydralazine  Stage III chronic kidney disease -Creatinine improved Plan Renal dose medications Follow-up intermittent chemistries  Fluid and electrolyte balance: Non-anion gap metabolic acidosis, hyperchloremia, and hypokalemia Plan Saline discontinued  add free water Replace potassium Follow-up chemistry a.m.  Diabetes with  hyperglycemia Plan Sliding scale insulin with basal dosing  Dysphasia Plan  Continue tube feeds  Disposition / Summary of Today's Plan 03/30/18   Include she is better.  Speech still slurred, still coughs from time to time.  Mental status is improved.  We will continue EEG monitoring, once that is discontinued she can be mobilized and moved out of the intensive care.  Will ask speech therapy to see her and perform formal swallow evaluation, hopefully we can start diet soon.     Diet:tubefeeds Pain/Anxiety/Delirium protocol (if indicated): na VAP protocol (if indicated): na DVT prophylaxis: [place] heparin  GI prophylaxis: na Hyperglycemia protocol: ssi Mobility: OOB Code Status: full code Family Communication: updated.   Labs   CBC: Recent Labs  Lab 03/25/18 1916 03/26/18 0317 03/27/18 0414 03/29/18 0324 03/30/18 0550  WBC 8.2 16.9* 16.2* 11.7* 8.3  NEUTROABS 5.7  --   --  9.3* 5.0  HGB 11.6* 12.2 9.7* 9.3* 9.5*  HCT 36.2 36.3 30.9* 28.9* 30.0*  MCV 87.2 87.1 88.5 88.7 88.8  PLT 249 287 201 206 470    Basic Metabolic Panel: Recent Labs  Lab 03/26/18 0317 03/26/18 1843 03/27/18 0414 03/27/18 1204 03/28/18 0322 03/28/18 1707 03/29/18 0324 03/30/18 0550  NA 132* 136 137  --  135  --  137 144  K 4.1 3.3* 3.6  --  3.4*  --  3.7 3.4*  CL 103 106 107  --  108  --  112* 118*  CO2 15* 19* 18*  --  16*  --  15* 20*  GLUCOSE 457* 148* 119*  --  242*  --  255* 171*  BUN 31* 34* 38*  --  47*  --  57* 55*  CREATININE 3.02* 3.50* 3.92*  --  3.85*  --  3.25* 2.42*  CALCIUM 9.1 8.6* 8.1*  --  7.4*  --  8.0* 8.6*  MG 2.2  --  1.9 2.6* 2.6* 2.6*  --   --   PHOS 3.8  --   --  5.6* 5.5* 3.5  --   --    GFR: Estimated Creatinine Clearance: 33.5 mL/min (A) (by C-G formula based on SCr of 2.42 mg/dL (H)). Recent Labs  Lab 03/25/18 2306 03/26/18 0317 03/27/18 0414 03/29/18 0324 03/30/18 0550  WBC  --  16.9* 16.2* 11.7* 8.3  LATICACIDVEN 2.56*  --   --   --   --     Liver  Function Tests: Recent Labs  Lab 03/25/18 1916  AST 23  ALT 11  ALKPHOS 83  BILITOT 0.9  PROT 7.6  ALBUMIN 3.5   No results for input(s): LIPASE, AMYLASE in the last 168 hours. No results for input(s): AMMONIA in the last 168 hours.  ABG    Component Value Date/Time   PHART 7.280 (L) 03/27/2018 0355   PCO2ART 40.6 03/27/2018 0355   PO2ART 110 (H) 03/27/2018 0355   HCO3 18.4 (L) 03/27/2018 0355   TCO2 20 (L) 03/26/2018 0109   ACIDBASEDEF 7.1 (H) 03/27/2018 0355   O2SAT 97.6 03/27/2018 0355     Coagulation Profile: Recent Labs  Lab 03/27/18 0414  INR 1.31    Cardiac Enzymes: No results for input(s): CKTOTAL, CKMB, CKMBINDEX, TROPONINI in the last 168 hours.  HbA1C: Hemoglobin A1C  Date/Time Value Ref Range Status  01/11/2018 03:17 PM 10.3 (A) 4.0 - 5.6 % Final  11/09/2017 03:37 PM 8.3 (A) 4.0 - 5.6 % Final   Hgb A1c MFr Bld  Date/Time Value Ref Range Status  07/26/2017 11:18 AM 12.5 (H) 4.8 - 5.6 % Final  Comment:    (NOTE) Pre diabetes:          5.7%-6.4% Diabetes:              >6.4% Glycemic control for   <7.0% adults with diabetes   12/29/2016 02:44 PM 11.3 (H) <5.7 % Final    Comment:      For someone without known diabetes, a hemoglobin A1c value of 6.5% or greater indicates that they may have diabetes and this should be confirmed with a follow-up test.   For someone with known diabetes, a value <7% indicates that their diabetes is well controlled and a value greater than or equal to 7% indicates suboptimal control. A1c targets should be individualized based on duration of diabetes, age, comorbid conditions, and other considerations.   Currently, no consensus exists for use of hemoglobin A1c for diagnosis of diabetes for children.       CBG: Recent Labs  Lab 03/29/18 1944 03/29/18 2315 03/30/18 0308 03/30/18 0806 03/30/18 1147  GLUCAP 204* 211* 178* 174* 199*    Critical care time: na   Erick Colace ACNP-BC [place] Pager # [phone] OR # [phone] if no answer

## 2018-03-31 LAB — CULTURE, BLOOD (ROUTINE X 2)
CULTURE: NO GROWTH
CULTURE: NO GROWTH
SPECIAL REQUESTS: ADEQUATE
SPECIAL REQUESTS: ADEQUATE

## 2018-03-31 LAB — CBC WITH DIFFERENTIAL/PLATELET
Abs Immature Granulocytes: 0.03 10*3/uL (ref 0.00–0.07)
BASOS PCT: 1 %
Basophils Absolute: 0 10*3/uL (ref 0.0–0.1)
EOS ABS: 0.2 10*3/uL (ref 0.0–0.5)
EOS PCT: 3 %
HCT: 29.8 % — ABNORMAL LOW (ref 36.0–46.0)
Hemoglobin: 9.2 g/dL — ABNORMAL LOW (ref 12.0–15.0)
Immature Granulocytes: 0 %
Lymphocytes Relative: 26 %
Lymphs Abs: 2.2 10*3/uL (ref 0.7–4.0)
MCH: 27.6 pg (ref 26.0–34.0)
MCHC: 30.9 g/dL (ref 30.0–36.0)
MCV: 89.5 fL (ref 80.0–100.0)
MONO ABS: 0.6 10*3/uL (ref 0.1–1.0)
Monocytes Relative: 7 %
NEUTROS ABS: 5.4 10*3/uL (ref 1.7–7.7)
Neutrophils Relative %: 63 %
PLATELETS: 261 10*3/uL (ref 150–400)
RBC: 3.33 MIL/uL — AB (ref 3.87–5.11)
RDW: 14.7 % (ref 11.5–15.5)
WBC: 8.5 10*3/uL (ref 4.0–10.5)
nRBC: 0 % (ref 0.0–0.2)

## 2018-03-31 LAB — GLUCOSE, CAPILLARY
GLUCOSE-CAPILLARY: 168 mg/dL — AB (ref 70–99)
GLUCOSE-CAPILLARY: 185 mg/dL — AB (ref 70–99)
Glucose-Capillary: 179 mg/dL — ABNORMAL HIGH (ref 70–99)
Glucose-Capillary: 159 mg/dL — ABNORMAL HIGH (ref 70–99)
Glucose-Capillary: 161 mg/dL — ABNORMAL HIGH (ref 70–99)
Glucose-Capillary: 140 mg/dL — ABNORMAL HIGH (ref 70–99)

## 2018-03-31 MED ORDER — CARVEDILOL 12.5 MG PO TABS
25.0000 mg | ORAL_TABLET | Freq: Two times a day (BID) | ORAL | Status: DC
  Administered 2018-03-31 – 2018-04-01 (×2): 25 mg
  Filled 2018-03-31 (×2): qty 2

## 2018-03-31 MED ORDER — ZOLPIDEM TARTRATE 5 MG PO TABS
5.0000 mg | ORAL_TABLET | Freq: Every evening | ORAL | Status: DC | PRN
  Administered 2018-03-31: 5 mg via ORAL
  Filled 2018-03-31: qty 1

## 2018-03-31 NOTE — Progress Notes (Signed)
LTM EEG report clinical interpretation: This day 1  of intensive EEG monitoring with simultaneous video monitoring did not record any clinical subclinical seizures.  Background activities remain normal throughout the recording.  Clinical correlation is advised.  Assessment:  47 year old female with new onset seizures in the setting of ? CNS infection Plan: LTM EEG will be discontinued. Continue Keppra at 500 mg BID.   Electronically signed: Dr. Kerney Elbe

## 2018-03-31 NOTE — Procedures (Signed)
Electroencephalography report.  Long-term monitoring  Recording begins 03/30/2018 at 10:23 AM Recording ends 03/31/2018 at 7:30 AM  Date acquisition: International 10-20 for eligible placement.  18 channels EEG with additional eyes limited ipsilateral ears and EKG.     Day 1 CPT code [id number]  This EEG was requested for this patient with CNS infection and possible seizures to rule out clinical and subclinical electrographic seizures.  No pushbutton activation events during this recording.  There was no clinical subclinical seizures present  Waking background activities marked by 8 to 9 cps posterior dominant waking rhythm which tend to attenuates with eyes opening.  Patient had normal drowsiness and sleep architecture  There was no interhemispheric asymmetries, focal abnormalities or epileptiform discharges present.  No clinical subclinical seizures  Clinical interpretation: This day 1  of intensive EEG monitoring with simultaneous video monitoring did not record any clinical subclinical seizures.  Background activities remain normal throughout the recording.  Clinical correlation is advised.

## 2018-03-31 NOTE — Progress Notes (Signed)
SLP Cancellation Note  Patient Details Name: Christina Orozco MRN: [id number] DOB: [birth year]   Cancelled treatment:       Reason Eval/Treat Not Completed: Fatigue/lethargy limiting ability to participate; tried x2 today.  Pt has cortrak for nutrition. Will continue efforts.   Juan Quam Laurice 03/31/2018, 3:36 PM Estill Bamberg L. Tivis Ringer, [place] Office number [phone] Pager [phone]

## 2018-03-31 NOTE — Progress Notes (Signed)
PT Cancellation Note  Patient Details Name: Christina Orozco MRN: [id number] DOB: 1970-12-07   Cancelled Treatment:    Reason Eval/Treat Not Completed: Fatigue/lethargy limiting ability to participate. Patient remains on Cont EEG and per nursing had not slept well last night, remains lethargic today. Will follow as able    Duncan Dull 03/31/2018, 1:13 PM

## 2018-03-31 NOTE — Progress Notes (Signed)
PROGRESS NOTE    Christina Orozco  DGU:[id number] DOB: 05-29-1971 DOA: 03/25/2018 PCP: Lanae Boast, FNP   Brief Narrative:  47 year old with past medical history relevant for type 2 diabetes on insulin, history of CVA, hypertension, hyperlipidemia, stage II-III CKD, chronic grade 1 diastolic dysfunction by echo on 07/26/2017 admitted on 03/25/2018 for postictal state and recurrent seizures of unclear etiology with intubation status post extubation on 03/29/2018 with persistent encephalopathy.   Assessment & Plan:   Active Problems:   Seizures ([place])   #) Seizures, gated by encephalopathy: Currently these are thought to be secondary to either uncontrolled hypertension versus hypoglycemia episode versus PR ES.  MRI is not classical for PR ES however per neuro it could possibly be related.  Her encephalopathy is improving. - Spot EEG on 03/29/2018 - Continue long-term EEG monitoring -Continue levetiracetam 500 mg twice daily -Neurology following, appreciate recommendations -Core track placed on 03/29/2018 is in place for feeding and medications, continue Jevity 1.2 kcal at 55 mL's an hour with feeding supplement -Speech line which pathology consult  #) Bacterial meningitis ruled out: On admission on 03/26/2018 patient had lumbar puncture that was negative for HSV, cryptococcus, culture, fungus.  Protein and glucose were both elevated the serum glucose was also elevated.  Cell count showed 30 white blood cells with neutrophilic predominance.  Patient was empirically treated with IV antibiotics, although these were discontinued when cultures were negative.  #) History of CVA: -Patient was continued on clopidogrel 75 mg daily -Patient was continued on statin  #) Type 2 diabetes: -Patient is on insulin degludec 60 units daily at home -Continue Levemir 10 units twice daily -Sliding scale insulin, AC at bedtime  #) Hypertension/hyperlipidemia: Currently the working diagnosis is that  hypertension is the primary driver for the seizures. -Continue amlodipine 10 mg daily -increase carvedilol to 25 mg twice daily -Continue hydralazine 50 mg every 6 hours -Continue PRN IV hydralazine for systolic blood pressure greater than 160  #) AKI on Stage II-III CKD: stable, baseline creatinine 1.6-2.3 -Resolved  Fluids: Restricted Elect lites: Monitor and supplement Nutrition: Core track, pending speech line which pathology evaluation  Prophylaxis: Subcu heparin  Disposition: Pending baseline mental status and PT evaluation  Full code    Consultants:   PCCM  Neurology  Procedures:  Intubation 03/26/2018 to 03/29/2018  03/29/2018 core track placed  EEG 03/29/2018:Normal electroencephalogram, awake and asleep. There are no focal lateralizing or epileptiform features. Overnight EEG 03/31/2018:This EEG was requested for this patient with CNS infection and possible seizures to rule out clinical and subclinical electrographic seizures.  No pushbutton activation events during this recording.  There was no clinical subclinical seizures present  Waking background activities marked by 8 to 9 cps posterior dominant waking rhythm which tend to attenuates with eyes opening.  Patient had normal drowsiness and sleep architecture  There was no interhemispheric asymmetries, focal abnormalities or epileptiform discharges present.  No clinical subclinical seizures  Clinical interpretation: This day 1  of intensive EEG monitoring with simultaneous video monitoring did not record any clinical subclinical seizures.  Background activities remain normal throughout the recording.  Clinical correlation is advised.    Antimicrobials: Antibiotics Given (last 72 hours)    Date/Time Action Medication Dose Rate   03/28/18 2129 New Bag/Given   meropenem (MERREM) 1 g in sodium chloride 0.9 % 100 mL IVPB 1 g 200 mL/hr   03/29/18 0637 New Bag/Given   vancomycin (VANCOCIN) IVPB 1000 mg/200  mL premix 1,000 mg 200 mL/hr   03/29/18  1042 New Bag/Given   meropenem (MERREM) 1 g in sodium chloride 0.9 % 100 mL IVPB 1 g 200 mL/hr        Subjective: Patient reports he does not have any pain right now.  She denies any nausea, vomiting, diarrhea, cough, congestion.  She reports she did not sleep well last night.  Objective: Vitals:   03/31/18 1100 03/31/18 1200 03/31/18 1239 03/31/18 1300  BP: (!) 147/72 124/64 (!) 158/80 (!) 160/81  Pulse: 88 88  88  Resp: _0 Temp:  98.2 F (36.8 C)    TempSrc:  Oral    SpO2: 94% 96%  95%  Weight:      Height:        Intake/Output Summary (Last 24 hours) at 03/31/2018 1316 Last data filed at 03/31/2018 0600 Gross per 24 hour  Intake 940 ml  Output 525 ml  Net 415 ml   Filed Weights   03/29/18 0600 03/30/18 0416 03/31/18 0454  Weight: 92 kg 96.7 kg 96.3 kg    Examination:  General exam: Alert but appears to be somewhat uncomfortable in bed Respiratory system: Clear to auscultation. Respiratory effort normal. Cardiovascular system: Regular rate and rhythm, no murmurs Gastrointestinal system: Abdomen is nondistended, soft and nontender. No organomegaly or masses felt. Normal bowel sounds heard. Central nervous system: Slowed but alert and oriented x3, moving all extremities spontaneously Extremities: Trace lower extremity edema Skin: No rashes over visible skin Psychiatry: Unable to assess due to medical condition    Data Reviewed: I have personally reviewed following labs and imaging studies  CBC: Recent Labs  Lab 03/25/18 1916 03/26/18 0317 03/27/18 0414 03/29/18 0324 03/30/18 0550 03/31/18 0206  WBC 8.2 16.9* 16.2* 11.7* 8.3 8.5  NEUTROABS 5.7  --   --  9.3* 5.0 5.4  HGB 11.6* 12.2 9.7* 9.3* 9.5* 9.2*  HCT 36.2 36.3 30.9* 28.9* 30.0* 29.8*  MCV 87.2 87.1 88.5 88.7 88.8 89.5  PLT 249 287 201 206 246 003   Basic Metabolic Panel: Recent Labs  Lab 03/26/18 0317 03/26/18 1843 03/27/18 0414  03/27/18 1204 03/28/18 0322 03/28/18 1707 03/29/18 0324 03/30/18 0550  NA 132* 136 137  --  135  --  137 144  K 4.1 3.3* 3.6  --  3.4*  --  3.7 3.4*  CL 103 106 107  --  108  --  112* 118*  CO2 15* 19* 18*  --  16*  --  15* 20*  GLUCOSE 457* 148* 119*  --  242*  --  255* 171*  BUN 31* 34* 38*  --  47*  --  57* 55*  CREATININE 3.02* 3.50* 3.92*  --  3.85*  --  3.25* 2.42*  CALCIUM 9.1 8.6* 8.1*  --  7.4*  --  8.0* 8.6*  MG 2.2  --  1.9 2.6* 2.6* 2.6*  --   --   PHOS 3.8  --   --  5.6* 5.5* 3.5  --   --    GFR: Estimated Creatinine Clearance: 33.4 mL/min (A) (by C-G formula based on SCr of 2.42 mg/dL (H)). Liver Function Tests: Recent Labs  Lab 03/25/18 1916  AST 23  ALT 11  ALKPHOS 83  BILITOT 0.9  PROT 7.6  ALBUMIN 3.5   No results for input(s): LIPASE, AMYLASE in the last 168 hours. No results for input(s): AMMONIA in the last 168 hours. Coagulation Profile: Recent Labs  Lab 03/27/18 0414  INR 1.31   Cardiac Enzymes: No  results for input(s): CKTOTAL, CKMB, CKMBINDEX, TROPONINI in the last 168 hours. BNP (last 3 results) No results for input(s): PROBNP in the last 8760 hours. HbA1C: No results for input(s): HGBA1C in the last 72 hours. CBG: Recent Labs  Lab 03/30/18 2008 03/30/18 2348 03/31/18 0406 03/31/18 0820 03/31/18 1244  GLUCAP 141* 168* 179* 159* 161*   Lipid Profile: No results for input(s): CHOL, HDL, LDLCALC, TRIG, CHOLHDL, LDLDIRECT in the last 72 hours. Thyroid Function Tests: No results for input(s): TSH, T4TOTAL, FREET4, T3FREE, THYROIDAB in the last 72 hours. Anemia Panel: No results for input(s): VITAMINB12, FOLATE, FERRITIN, TIBC, IRON, RETICCTPCT in the last 72 hours. Sepsis Labs: Recent Labs  Lab 03/25/18 2306  LATICACIDVEN 2.56*    Recent Results (from the past 240 hour(s))  MRSA PCR Screening     Status: None   Collection Time: 03/26/18 12:09 AM  Result Value Ref Range Status   MRSA by PCR NEGATIVE NEGATIVE Final    Comment:         The GeneXpert MRSA Assay (FDA approved for NASAL specimens only), is one component of a comprehensive MRSA colonization surveillance program. It is not intended to diagnose MRSA infection nor to guide or monitor treatment for MRSA infections. Performed at [facility], [place] [address], [place], [place] [address]   CSF culture     Status: None   Collection Time: 03/26/18  7:33 AM  Result Value Ref Range Status   Specimen Description CSF  Final   Special Requests NONE  Final   Gram Stain   Final    CYTOSPIN SMEAR WBC PRESENT, PREDOMINANTLY PMN NO ORGANISMS SEEN    Culture   Final    NO GROWTH 3 DAYS Performed at [facility], [place] [address], [place], [place] [address]    Report Status 03/29/2018 FINAL  Final  Fungus Culture With Stain     Status: None (Preliminary result)   Collection Time: 03/26/18  7:33 AM  Result Value Ref Range Status   Fungus Stain Final report  Final    Comment: (NOTE) Performed At: [facility] [place], [place] [id number] Rush Farmer MD [id number]    Fungus (Mycology) Culture PENDING  Incomplete   Fungal Source CSF  Final    Comment: Performed at [facility], [place] [address] [place], [place] [address]  Fungus Culture Result     Status: None   Collection Time: 03/26/18  7:33 AM  Result Value Ref Range Status   Result 1 Comment  Final    Comment: (NOTE) KOH/Calcofluor preparation:  no fungus observed. Performed At: [facility] [place], [place] [id number] Rush Farmer MD KG:[id number]   Culture, blood (Routine X 2) w Reflex to ID Panel     Status: None (Preliminary result)   Collection Time: 03/26/18 11:37 AM  Result Value Ref Range Status   Specimen Description BLOOD RIGHT HAND  Final   Special Requests   Final    BOTTLES DRAWN AEROBIC ONLY Blood Culture adequate volume   Culture   Final    NO GROWTH 4 DAYS Performed at [facility], [place] [address]  [place], [place] [address]    Report Status PENDING  Incomplete  Culture, blood (Routine X 2) w Reflex to ID Panel     Status: None (Preliminary result)   Collection Time: 03/26/18 11:40 AM  Result Value Ref Range Status   Specimen Description BLOOD RIGHT HAND  Final  Special Requests   Final    BOTTLES DRAWN AEROBIC ONLY Blood Culture adequate volume   Culture   Final    NO GROWTH 4 DAYS Performed at [facility], [place] [address], [place], [place] [address]    Report Status PENDING  Incomplete         Radiology Studies: Dg Chest Port 1 View  Result Date: 03/30/2018 CLINICAL DATA:  Respiratory failure. EXAM: PORTABLE CHEST 1 VIEW COMPARISON:  03/28/2018 FINDINGS: Endotracheal tube and nasogastric tube have been removed. There is now a feeding tube that extends into the abdomen. Cardiac loop recorder is again noted in the chest. Lungs are clear without focal airspace disease or pulmonary edema. Heart size is within normal limits and stable. IMPRESSION: No focal lung disease. Interval placement of a feeding tube. Tip of the feeding tube is not visualized. Electronically Signed   By: Markus Daft M.D.   On: 03/30/2018 09:01        Scheduled Meds: . amLODipine  10 mg Per Tube Daily  . atorvastatin  40 mg Per Tube q1800  . carvedilol  12.5 mg Per Tube BID WC  . chlorhexidine gluconate (MEDLINE KIT)  15 mL Mouth Rinse BID  . clopidogrel  75 mg Per Tube Daily  . feeding supplement (PRO-STAT SUGAR FREE 64)  30 mL Per Tube BID  . heparin injection (subcutaneous)  5,000 Units Subcutaneous Q8H  . hydrALAZINE  50 mg Per Tube Q6H  . insulin aspart  0-20 Units Subcutaneous Q4H  . insulin detemir  10 Units Subcutaneous Q12H  . levETIRAcetam  500 mg Per Tube BID  . mouth rinse  15 mL Mouth Rinse 10 times per day  . pantoprazole sodium  40 mg Per Tube Daily   Continuous Infusions: . feeding supplement (JEVITY 1.2 CAL) 55 mL/hr at 03/31/18 0600     LOS: 6 days    Time spent:  [place], MD [facility] If 7PM-7AM, please contact night-coverage [email or web address] Password [email or web address] 03/31/2018, 1:16 PM

## 2018-04-01 DIAGNOSIS — E876 Hypokalemia: Secondary | ICD-10-CM

## 2018-04-01 DIAGNOSIS — N183 Chronic kidney disease, stage 3 (moderate): Secondary | ICD-10-CM

## 2018-04-01 DIAGNOSIS — E1169 Type 2 diabetes mellitus with other specified complication: Secondary | ICD-10-CM

## 2018-04-01 DIAGNOSIS — I10 Essential (primary) hypertension: Secondary | ICD-10-CM

## 2018-04-01 DIAGNOSIS — E669 Obesity, unspecified: Secondary | ICD-10-CM

## 2018-04-01 DIAGNOSIS — I6930 Unspecified sequelae of cerebral infarction: Secondary | ICD-10-CM

## 2018-04-01 LAB — CBC WITH DIFFERENTIAL/PLATELET
Abs Immature Granulocytes: 0.03 10*3/uL (ref 0.00–0.07)
BASOS ABS: 0 10*3/uL (ref 0.0–0.1)
Basophils Relative: 1 %
EOS ABS: 0.2 10*3/uL (ref 0.0–0.5)
EOS PCT: 3 %
HEMATOCRIT: 31.8 % — AB (ref 36.0–46.0)
Hemoglobin: 9.5 g/dL — ABNORMAL LOW (ref 12.0–15.0)
Immature Granulocytes: 0 %
Lymphocytes Relative: 32 %
Lymphs Abs: 2.6 10*3/uL (ref 0.7–4.0)
MCH: 27.3 pg (ref 26.0–34.0)
MCHC: 29.9 g/dL — AB (ref 30.0–36.0)
MCV: 91.4 fL (ref 80.0–100.0)
Monocytes Absolute: 0.7 10*3/uL (ref 0.1–1.0)
Monocytes Relative: 9 %
NEUTROS ABS: 4.6 10*3/uL (ref 1.7–7.7)
NEUTROS PCT: 55 %
NRBC: 0 % (ref 0.0–0.2)
Platelets: 300 10*3/uL (ref 150–400)
RBC: 3.48 MIL/uL — ABNORMAL LOW (ref 3.87–5.11)
RDW: 15 % (ref 11.5–15.5)
WBC: 8.3 10*3/uL (ref 4.0–10.5)

## 2018-04-01 LAB — GLUCOSE, CAPILLARY
GLUCOSE-CAPILLARY: 222 mg/dL — AB (ref 70–99)
Glucose-Capillary: 141 mg/dL — ABNORMAL HIGH (ref 70–99)
Glucose-Capillary: 143 mg/dL — ABNORMAL HIGH (ref 70–99)
Glucose-Capillary: 122 mg/dL — ABNORMAL HIGH (ref 70–99)
Glucose-Capillary: 125 mg/dL — ABNORMAL HIGH (ref 70–99)

## 2018-04-01 MED ORDER — DOCUSATE SODIUM 50 MG/5ML PO LIQD: 100.0000 mg | Freq: Two times a day (BID) | ORAL | Status: DC | PRN

## 2018-04-01 MED ORDER — LEVETIRACETAM 100 MG/ML PO SOLN
500.0000 mg | Freq: Two times a day (BID) | ORAL | Status: DC
  Administered 2018-04-01 – 2018-04-02 (×2): 500 mg via ORAL
  Filled 2018-04-01 (×2): qty 5

## 2018-04-01 MED ORDER — HYDRALAZINE HCL 50 MG PO TABS
100.0000 mg | ORAL_TABLET | Freq: Four times a day (QID) | ORAL | Status: DC
  Administered 2018-04-01 – 2018-04-02 (×5): 100 mg via ORAL
  Filled 2018-04-01 (×5): qty 2

## 2018-04-01 MED ORDER — CLOPIDOGREL BISULFATE 75 MG PO TABS
75.0000 mg | ORAL_TABLET | Freq: Every day | ORAL | Status: DC
  Administered 2018-04-02: 75 mg via ORAL
  Filled 2018-04-01: qty 1

## 2018-04-01 MED ORDER — PANTOPRAZOLE SODIUM 40 MG PO PACK
40.0000 mg | PACK | Freq: Every day | ORAL | Status: DC
  Administered 2018-04-02: 40 mg via ORAL
  Filled 2018-04-01: qty 20

## 2018-04-01 MED ORDER — INSULIN ASPART 100 UNIT/ML ~~LOC~~ SOLN: 0.0000 [IU] | Freq: Every day | SUBCUTANEOUS | Status: DC

## 2018-04-01 MED ORDER — ACETAMINOPHEN 325 MG PO TABS: 650.0000 mg | ORAL_TABLET | Freq: Four times a day (QID) | ORAL | Status: DC | PRN

## 2018-04-01 MED ORDER — AMLODIPINE BESYLATE 10 MG PO TABS
10.0000 mg | ORAL_TABLET | Freq: Every day | ORAL | Status: DC
  Administered 2018-04-02: 10 mg via ORAL
  Filled 2018-04-01: qty 1

## 2018-04-01 MED ORDER — ATORVASTATIN CALCIUM 40 MG PO TABS
40.0000 mg | ORAL_TABLET | Freq: Every day | ORAL | Status: DC
  Administered 2018-04-01 – 2018-04-02 (×2): 40 mg via ORAL
  Filled 2018-04-01 (×2): qty 1

## 2018-04-01 MED ORDER — INSULIN ASPART 100 UNIT/ML ~~LOC~~ SOLN
0.0000 [IU] | Freq: Three times a day (TID) | SUBCUTANEOUS | Status: DC
  Administered 2018-04-01: 3 [IU] via SUBCUTANEOUS
  Administered 2018-04-02: 4 [IU] via SUBCUTANEOUS
  Administered 2018-04-02: 7 [IU] via SUBCUTANEOUS
  Administered 2018-04-02: 3 [IU] via SUBCUTANEOUS

## 2018-04-01 MED ORDER — CARVEDILOL 12.5 MG PO TABS
25.0000 mg | ORAL_TABLET | Freq: Two times a day (BID) | ORAL | Status: DC
  Administered 2018-04-01 – 2018-04-02 (×3): 25 mg via ORAL
  Filled 2018-04-01 (×3): qty 2

## 2018-04-01 NOTE — Evaluation (Signed)
Speech Language Pathology Evaluation Patient Details Name: Christina Orozco MRN: [id number] DOB: 01/20/1971 Today's Date: 04/01/2018 Time: 8921-1941 SLP Time Calculation (min) (ACUTE ONLY): 35 min  Problem List:  Patient Active Problem List   Diagnosis Date Noted  . Seizures ([place]) 03/25/2018  . Diabetes ([place]) 09/28/2017  . Anemia of chronic disease 08/09/2017  . Hyperlipidemia 07/26/2017  . 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[place]) 07/25/2017  . Anxiety as acute reaction to exceptional stress 04/23/2017  . Insomnia 10/08/2016  . Bacterial vaginitis 10/08/2016  . Neuropathy 06/07/2016  . Gait instability   . Lesion of pons   . Slurred speech   . Dizziness 04/30/2016  . Hyperglycemia 04/30/2016  . Chronic kidney disease (CKD), stage III (moderate) ([place]) 04/30/2016  . Ataxia 04/30/2016  . Chronic right shoulder pain 04/03/2016  . 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 Rhinitis, allergic 08/25/2015  . Asthma, mild intermittent 07/14/2015  . Gastroesophageal reflux disease without esophagitis 06/08/2015  . Alopecia 06/08/2015  . Hypertensive cardiovascular disease 05/05/2015  . Vitamin D deficiency 05/05/2015   Past Medical History:  Past Medical History:  Diagnosis Date  . Anemia    severe  . Asthma   . CKD (chronic kidney disease)    stage IV  . DDD (degenerative disc disease), cervical   . Diabetes mellitus ([place])   . Dislocated shoulder    right  . GERD (gastroesophageal reflux disease)   . Hypertension   . Neuropathy   . Stroke [facility])    Past Surgical History:  Past Surgical History:  Procedure Laterality Date  . ADENOIDECTOMY    . APPENDECTOMY    . CERVICAL SPINE SURGERY  06/2012   C5-C6  . EYE SURGERY     left eye surgery   . FOOT SURGERY    . LOOP RECORDER INSERTION N/A 08/27/2017   Procedure: LOOP RECORDER INSERTION;  Surgeon: Sanda Klein, MD;  Location: [place] [facility];  Service: Cardiovascular;  Laterality: N/A;  . SHOULDER SURGERY    . TEE WITHOUT  CARDIOVERSION N/A 08/27/2017   Procedure: TRANSESOPHAGEAL ECHOCARDIOGRAM (TEE);  Surgeon: Sanda Klein, MD;  Location[facility] ENDOSCOPY;  Service: Cardiovascular;  Laterality: N/A;  . TONSILLECTOMY     HPI:  Christina [name] an 47 y.o.femalepresented with new onset seizures and has remained encephalopathic. CSF examination suggestive of bacterial meningitis although Gram stain and cultures are negative. 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 PMH: CVA, hypertension, diabetes, GERD, CKD stage II, asthma, anemia, and neuropathy. Intubted 10/14-10/18 . CXR no focal lung disease.   Assessment / Plan / Recommendation Clinical Impression  Pt demonstrates impaired cognitive function with moderately impulsive behavior, decreased awareness of physical weakness, short term memory impairment and flat affect. Pt had been observed to have decreased initiation and problem solving, not making requests, replying in a somewaht robotic prosodic pattern. With time, PO intake, conversation pt became more and more appropriate. She was able to participate in conversational turn taking, provided details about her family, offered appropriate comments about her state of mind "I feel like i'm in the twilight zone." Suspect deficits are related to gradual recovery from ICU delirium and seizures with expected improvement with time. SLP will follow to facilitate cognitive recovery and monitor for f/u needs. At this time would recommend CIR.     SLP Assessment  SLP Recommendation/Assessment: Patient needs continued Speech Lanaguage Pathology Services SLP Visit Diagnosis: Cognitive communication deficit (R41.841)    Follow Up Recommendations  24 hour supervision/assistance    Frequency and Duration  min 2x/week  2 weeks      SLP Evaluation Cognition  Overall Cognitive Status: Impaired/Different from baseline Arousal/Alertness: Awake/alert Orientation Level: Oriented to person;Oriented to  place;Oriented to situation;Disoriented to time Attention: Alternating Alternating Attention: Appears intact Memory: Impaired Memory Impairment: Storage deficit;Decreased short term memory Decreased Short Term Memory: Verbal basic Awareness: Impaired Awareness Impairment: Emergent impairment Executive Function: Self Monitoring Self Monitoring: Impaired Self Monitoring Impairment: Verbal basic;Functional basic Behaviors: Other (comment)(flat affect) Safety/Judgment: Impaired       Comprehension  Auditory Comprehension Overall Auditory Comprehension: Appears within functional limits for tasks assessed Reading Comprehension Reading Status: Within funtional limits    Expression Verbal Expression Overall Verbal Expression: Appears within functional limits for tasks assessed Written Expression Dominant Hand: Right   Oral / Motor  Oral Motor/Sensory Function Overall Oral Motor/Sensory Function: Within functional limits Motor Speech Overall Motor Speech: (prosody mildly flat)   GO                    Kemonte Ullman, Katherene Ponto 04/01/2018, 9:51 AM

## 2018-04-01 NOTE — Consult Note (Signed)
Physical Medicine and Rehabilitation Consult Reason for Consult: Decreased functional mobility Referring Physician: [facility]   HPI: Christina Orozco is a 47 y.o. 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  Per chart review and patient, patient lives with her boyfriend and 73 year old daughter.  Independent prior to admission.  Works doing data entry for a dog show.  One level home.  Boyfriend works during the day.  Presented 03/25/2018 with seizure requiring intubation for airway protection.  Noted systolic blood pressures in the 200s as well as elevated blood sugars.  Cranial CT scan reviewed, unremarkable for acute intracranial process. CT angiogram of head and neck with no dissection aneurysm or significant stenosis.  Urine drug screen negative.  MRI of the brain showed indistinct T2 hyperintensity in the pons that progressed from 07/25/2017 MRI.  The patient has had fluctuating pontine signals when compared to studies in 2018 and 2019 favoring atypical PRES or osmotic demyelination.  Latest EEG showing a mixture of theta and alpha rhythms seen from the central and temporal regions but did not record any clinical subclinical seizures.  Currently maintained on Keppra for seizure prophylaxis.  Plavix initiated for CVA prophylaxis.  Subcutaneous heparin for DVT prophylaxis.  Nasogastric tube in place for nutritional support.  Therapy evaluations completed with recommendations of physical medicine rehab consult.   Review of Systems  Constitutional: Negative for chills and fever.  HENT: Negative for hearing loss.   Eyes: Negative for blurred vision and double vision.  Respiratory: Positive for shortness of breath.   Cardiovascular: Negative for chest pain, palpitations and leg swelling.  Gastrointestinal: Positive for constipation. Negative for nausea and vomiting.       GERD  Genitourinary: Negative for flank pain and  hematuria.  Skin: Negative for rash.  Neurological: Positive for dizziness and seizures. Negative for weakness.  All other systems reviewed and are negative.  Past Medical History:  Diagnosis Date  . Anemia    severe  . Asthma   . CKD (chronic kidney disease)    stage IV  . DDD (degenerative disc disease), cervical   . Diabetes mellitus ([place])   . Dislocated shoulder    right  . GERD (gastroesophageal reflux disease)   . Hypertension   . Neuropathy   . Stroke [facility])    Past Surgical History:  Procedure Laterality Date  . ADENOIDECTOMY    . APPENDECTOMY    . CERVICAL SPINE SURGERY  06/2012   C5-C6  . EYE SURGERY     left eye surgery   . FOOT SURGERY    . LOOP RECORDER INSERTION N/A 08/27/2017   Procedure: LOOP RECORDER INSERTION;  Surgeon: Sanda Klein, MD;  Location: [place] CV LAB;  Service: Cardiovascular;  Laterality: N/A;  . SHOULDER SURGERY    . TEE WITHOUT CARDIOVERSION N/A 08/27/2017   Procedure: TRANSESOPHAGEAL ECHOCARDIOGRAM (TEE);  Surgeon: Sanda Klein, MD;  Location: [facility] ENDOSCOPY;  Service: Cardiovascular;  Laterality: N/A;  . TONSILLECTOMY     Family History  Problem Relation Age of Onset  . Vascular Disease Mother   . CAD Mother   . Heart failure Mother   . Heart disease Unknown   . Cancer Unknown        colon, stomach, lung  . Diabetes Unknown   . Seizures Unknown   . Breast cancer Sister 69   Social History:  reports that she has never smoked. She has never used smokeless tobacco. She  reports that she does not drink alcohol or use drugs. Allergies:  Allergies  Allergen Reactions  . Lisinopril Swelling and Other (See Comments)    Facial swelling   . Penicillins Hives    Has patient had a PCN reaction causing immediate rash, facial/tongue/throat swelling, SOB or lightheadedness with hypotension: yes Has patient had a PCN reaction causing severe rash involving mucus membranes or skin necrosis: No Has patient had a PCN reaction that required  hospitalization No Has patient had a PCN reaction occurring within the last 10 years: No If all of the above answers are "NO", then may proceed wit  . Gabapentin Swelling and Other (See Comments)    "I thought I was having another stroke."    Medications Prior to Admission  Medication Sig Dispense Refill  . amLODipine (NORVASC) 10 MG tablet Take 1 tablet (10 mg total) by mouth daily. 90 tablet 3  . clopidogrel (PLAVIX) 75 MG tablet Take 1 tablet (75 mg total) by mouth daily. 90 tablet 1  . doxazosin (CARDURA) 4 MG tablet Take 1 tablet (4 mg total) by mouth daily. 90 tablet 1  . furosemide (LASIX) 80 MG tablet Take 1 tablet (80 mg total) by mouth 2 (two) times daily. 180 tablet 1  . hydrALAZINE (APRESOLINE) 100 MG tablet Take 1 tablet (100 mg total) by mouth 3 (three) times daily. 90 tablet 5  . Insulin Degludec (TRESIBA FLEXTOUCH) 200 UNIT/ML SOPN Inject 60 Units into the skin daily. And pen needles 1/day 6 pen 11  . insulin glargine (LANTUS) 100 UNIT/ML injection Inject 70 Units into the skin every morning.    . meclizine (ANTIVERT) 25 MG tablet TAKE 1 TABLET(25 MG) BY MOUTH EVERY 8 HOURS AS NEEDED FOR DIZZINESS (Patient taking differently: Take 25 mg by mouth every 8 (eight) hours as needed for dizziness. ) 90 tablet 0  . atorvastatin (LIPITOR) 40 MG tablet TAKE 1 TABLET BY MOUTH EVERY EVENING AT 6PM (Patient not taking: No sig reported) 30 tablet 0  . carvedilol (COREG) 25 MG tablet Take 1 tablet (25 mg total) by mouth 2 (two) times daily with a meal. Take 25 mg twice daily (Patient not taking: Reported on 03/26/2018) 180 tablet 3  . ferrous sulfate 325 (65 FE) MG tablet Take 1 tablet (325 mg total) by mouth daily. (Patient not taking: Reported on 03/26/2018) 30 tablet 3  . glucose blood (CONTOUR NEXT TEST) test strip 1 each by Other route 2 (two) times daily. And lancets 2/day 100 each 12  . PROAIR HFA 108 (90 Base) MCG/ACT inhaler INHALE 1 OR 2 PUFFS INTO THE LUNGS EVERY 6 HOURS AS NEEDED  FOR WHEEZING OR SHORTNESS OF BREATH (Patient not taking: No sig reported) 8.5 g 0    Home: Home Living Family/patient expects to be discharged to:: Private residence Living Arrangements: Spouse/significant other Available Help at Discharge: Available PRN/intermittently Type of Home: Apartment Home Access: Level entry Home Layout: One level Bathroom Shower/Tub: Tub/shower unit, Architectural technologist: Standard Bathroom Accessibility: No Home Equipment: Grab bars - toilet, Grab bars - tub/shower  Lives With: Family  Functional History: Prior Function Level of Independence: Independent 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 Functional Status:  Mobility: [place] bed mobility: Needs Assistance Bed Mobility: Supine to Sit Supine to sit: Min guard, HOB elevated General bed mobility comments: pt initiated however required increased time due impaired sequencing and staying on task Transfers Overall transfer level: Needs assistance Equipment used: None Transfers: Sit to/from Stand  Sit to Stand: Mod assist General transfer comment: upon standing pt swaying Left/right requiring modA to maintain balance, pt attempting to move feet to find balance however impaired coordination Ambulation/Gait Ambulation/Gait assistance: Mod assist, +2 safety/equipment Gait Distance (Feet): 150 Feet Assistive device: Rolling walker (2 wheeled) Gait Pattern/deviations: Step-through pattern, Decreased stride length, Staggering left, Staggering right General Gait Details: attempted to amb without AD as PTA however pt quickly had an episode of LOB requiring maxA to prevenet fall. pt with impaired trunk control, sequencing, motor planning and control. Pt reports "i feel off, wierd" pt reports sensation of pins and needles in hands and feet. modA for walker management Gait velocity: slow Gait velocity interpretation: <1.8 ft/sec, indicate of risk for recurrent falls    ADL:     Cognition: Cognition Overall Cognitive Status: Impaired/Different from baseline Arousal/Alertness: Awake/alert Orientation Level: Oriented to person, Oriented to place, Oriented to situation, Disoriented to time Attention: Alternating Alternating Attention: Appears intact Memory: Impaired Memory Impairment: Storage deficit, Decreased short term memory Decreased Short Term Memory: Verbal basic Awareness: Impaired Awareness Impairment: Emergent impairment Executive Function: Self Monitoring Self Monitoring: Impaired Self Monitoring Impairment: Verbal basic, Functional basic Behaviors: Other (comment)(flat affect) Safety/Judgment: Impaired Cognition Arousal/Alertness: Awake/alert Behavior During Therapy: Flat affect Overall Cognitive Status: Impaired/Different from baseline Area of Impairment: Problem solving Problem Solving: Slow processing, Decreased initiation, Difficulty sequencing, Requires verbal cues, Requires tactile cues General Comments: pt with delayed response time, sequencing, and intiation  Blood pressure (!) 173/87, pulse 92, temperature 98.3 F (36.8 C), temperature source Oral, resp. rate 16, height 5\' 4"  (1.626 m), weight 95.1 kg, last menstrual period 03/12/2018, SpO2 95 %. Physical Exam  Vitals reviewed. Constitutional: She appears well-developed.  47 year old right-handed obese female  HENT:  Head: Normocephalic and atraumatic.  Nasogastric tube in place +NG  Eyes: EOM are normal. Right eye exhibits no discharge. Left eye exhibits no discharge.  Neck: Normal range of motion. Neck supple. No thyromegaly present.  Cardiovascular: Normal rate, regular rhythm and normal heart sounds.  Respiratory: Effort normal and breath sounds normal. No respiratory distress.  GI: Soft. Bowel sounds are normal. She exhibits no distension.  Musculoskeletal:  No edema or tenderness in extremities  Neurological: She is alert.  Patient follows commands.   She cannot recall  full events of her hospital stay. A&Ox2 Motor: RUE: 4/5 proximal to distal LUE: 4+/5 proximal to dista RLE: HF, KE 3/5, ADF 4+/5 LLE: HF, KE 3/5, ADF 4+/5  Skin: Skin is warm and dry.  Psychiatric: She has a normal mood and affect. Her behavior is normal.    Results for orders placed or performed during the hospital encounter of 03/25/18 (from the past 24 hour(s))  Glucose, capillary     Status: Abnormal   Collection Time: 03/31/18 12:44 PM  Result Value Ref Range   Glucose-Capillary 161 (H) 70 - 99 mg/dL   Comment 1 Notify RN    Comment 2 Document in Chart   Glucose, capillary     Status: Abnormal   Collection Time: 03/31/18  4:05 PM  Result Value Ref Range   Glucose-Capillary 185 (H) 70 - 99 mg/dL   Comment 1 Notify RN    Comment 2 Document in Chart   Glucose, capillary     Status: Abnormal   Collection Time: 03/31/18  8:07 PM  Result Value Ref Range   Glucose-Capillary 140 (H) 70 - 99 mg/dL  Glucose, capillary     Status: Abnormal   Collection Time: 03/31/18 11:37 PM  Result Value Ref Range   Glucose-Capillary 168 (H) 70 - 99 mg/dL  Glucose, capillary     Status: Abnormal   Collection Time: 04/01/18  3:26 AM  Result Value Ref Range   Glucose-Capillary 141 (H) 70 - 99 mg/dL  CBC with Differential/Platelet     Status: Abnormal   Collection Time: 04/01/18  6:56 AM  Result Value Ref Range   WBC 8.3 4.0 - 10.5 K/uL   RBC 3.48 (L) 3.87 - 5.11 MIL/uL   Hemoglobin 9.5 (L) 12.0 - 15.0 g/dL   HCT 31.8 (L) 36.0 - 46.0 %   MCV 91.4 80.0 - 100.0 fL   MCH 27.3 26.0 - 34.0 pg   MCHC 29.9 (L) 30.0 - 36.0 g/dL   RDW 15.0 11.5 - 15.5 %   Platelets 300 150 - 400 K/uL   nRBC 0.0 0.0 - 0.2 %   Neutrophils Relative % 55 %   Neutro Abs 4.6 1.7 - 7.7 K/uL   Lymphocytes Relative 32 %   Lymphs Abs 2.6 0.7 - 4.0 K/uL   Monocytes Relative 9 %   Monocytes Absolute 0.7 0.1 - 1.0 K/uL   Eosinophils Relative 3 %   Eosinophils Absolute 0.2 0.0 - 0.5 K/uL   Basophils Relative 1 %    Basophils Absolute 0.0 0.0 - 0.1 K/uL   Immature Granulocytes 0 %   Abs Immature Granulocytes 0.03 0.00 - 0.07 K/uL  Glucose, capillary     Status: Abnormal   Collection Time: 04/01/18  8:00 AM  Result Value Ref Range   Glucose-Capillary 143 (H) 70 - 99 mg/dL   Comment 1 Notify RN    Comment 2 Document in Chart    No results found.  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   Labs and images (see above) independently reviewed.  Records reviewed and summated above.  1. Does the need for close, 24 hr/day medical supervision in concert with the patient's rehab needs make it unreasonable for this patient to be served in a less intensive setting? Potentially  2. Co-Morbidities requiring supervision/potential complications: seizure (cont meds), CKD stage III (avoid nephrotoxic meds), diabetes mellitus (Monitor in accordance with exercise and adjust meds as necessary), HTN (monitor and provide prns in accordance with increased physical exertion and pain, wean IV meds when appropriate), CVA with no residual weakness (cont meds), hypokalemia (continue to monitor and replete as necessary) 3. Due to safety, disease management and patient education, does the patient require 24 hr/day rehab nursing? Potentially 4. Does the patient require coordinated care of a physician, rehab nurse, PT (1-2 hrs/day, 5 days/week), OT (1-2 hrs/day, 5 days/week) and SLP (1-2 hrs/day, 5 days/week) to address physical and functional deficits in the context of the above medical diagnosis(es)? Potentially Addressing deficits in the following areas: balance, endurance, locomotion, strength, transferring, bathing, dressing, toileting, cognition and psychosocial support 5. Can the patient actively participate in an intensive therapy program of at least 3 hrs of therapy per day at least 5 days per week? Yes 6. The potential for patient to make measurable gains while on inpatient rehab is  excellent 7. Anticipated functional outcomes upon discharge from inpatient rehab are supervision  with PT, supervision with OT, modified independent with SLP. 8. Estimated rehab length of stay to reach the above functional goals is: 5-8 days. 9. Anticipated D/C setting: Home 10. Anticipated post D/C treatments: HH therapy and Home excercise program 11. Overall Rehab/Functional Prognosis: excellent and good  RECOMMENDATIONS: This patient's condition is appropriate for  continued rehabilitative care in the following setting: Potentially CIR. Would like to see progress in therapies to determine need for CIR. Patient has agreed to participate in recommended program. Potentially Note that insurance prior authorization may be required for reimbursement for recommended care.  Comment: Rehab Admissions Coordinator to follow up.   I have personally performed a face to face diagnostic evaluation, including, but not limited to relevant history and physical exam findings, of this patient and developed relevant assessment and plan.  Additionally, I have reviewed and concur with the physician assistant's documentation above.   Delice Lesch, MD, ABPMR Lavon Paganini Angiulli, PA-C 04/01/2018

## 2018-04-01 NOTE — Progress Notes (Signed)
   Rehab Admissions Coordinator Note:  Patient was screened by Cleatrice Burke for appropriateness for an Inpatient Acute Rehab Consult per PT recommendation.   At this time, we are recommending Inpatient Rehab consult.  Cleatrice Burke 04/01/2018, 11:18 AM  I can be reached at [phone].

## 2018-04-01 NOTE — Progress Notes (Signed)
LTM EEG d/c, leads removed, no skin break down at the time of disconnect.

## 2018-04-01 NOTE — Evaluation (Signed)
Physical Therapy Evaluation Patient Details Name: Christina Orozco MRN: [id number] DOB: [birth year] Today's Date: 04/01/2018   History of Present Illness  Pt is a 47 yo female admitted 10/14 with multiple seizures in setting of HTN emergency and hyperglycemia. PMH: CVA, HTN, DM, GERD, CKD 2, Asthma, Anemia, Neuropathy. Intubated 10/15-10/18.  Clinical Impression  Pt admitted with above. Pt presenting with impulsivity, flat effect, impaired motor planning and problem solving. Pt responding in Robotic type way. Pt reports "I feel off, weird." Pt was indep PTA now requires physical assist and RW for safe ambulation. Pt with poor gait sequencing, difficulty with walker management, and impaired balance. Pt to benefit from CIR upon d/c to maximize functional return and return to independence as PTA.    Follow Up Recommendations CIR    Equipment Recommendations  Rolling walker with 5" wheels    Recommendations for Other Services       Precautions / Restrictions Precautions Precautions: Fall Precaution Comments: seizure Restrictions Weight Bearing Restrictions: No      Mobility  Bed Mobility Overal bed mobility: Needs Assistance Bed Mobility: Supine to Sit     Supine to sit: Min guard;HOB elevated     General bed mobility comments: pt initiated however required increased time due impaired sequencing and staying on task  Transfers Overall transfer level: Needs assistance Equipment used: None Transfers: Sit to/from Stand Sit to Stand: Mod assist         General transfer comment: upon standing pt swaying Left/right requiring modA to maintain balance, pt attempting to move feet to find balance however impaired coordination  Ambulation/Gait Ambulation/Gait assistance: Mod assist;+2 safety/equipment Gait Distance (Feet): 150 Feet Assistive device: Rolling walker (2 wheeled) Gait Pattern/deviations: Step-through pattern;Decreased stride length;Staggering left;Staggering  right Gait velocity: slow Gait velocity interpretation: <1.8 ft/sec, indicate of risk for recurrent falls General Gait Details: attempted to amb without AD as PTA however pt quickly had an episode of LOB requiring maxA to prevenet fall. pt with impaired trunk control, sequencing, motor planning and control. Pt reports "i feel off, wierd" pt reports sensation of pins and needles in hands and feet. modA for walker management  Stairs            Wheelchair Mobility    Modified Rankin (Stroke Patients Only) Modified Rankin (Stroke Patients Only) Pre-Morbid Rankin Score: No symptoms Modified Rankin: Moderate disability     Balance Overall balance assessment: Needs assistance Sitting-balance support: No upper extremity supported;Feet unsupported Sitting balance-Leahy Scale: Fair Sitting balance - Comments: pt attempted to don socks sitting EOB however required minA to maintain balance to complete task   Standing balance support: Bilateral upper extremity supported Standing balance-Leahy Scale: Fair Standing balance comment: dependent on RW, pt very unsteady                             Pertinent Vitals/Pain Pain Assessment: No/denies pain    Home Living Family/patient expects to be discharged to:: Private residence Living Arrangements: Spouse/significant other Available Help at Discharge: Available PRN/intermittently Type of Home: Apartment Home Access: Level entry     Home Layout: One level Home Equipment: Grab bars - toilet;Grab bars - tub/shower      Prior Function Level of Independence: Independent         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     Hand Dominance   Dominant Hand: Right    Extremity/Trunk Assessment   Upper Extremity Assessment Upper Extremity  Assessment: LUE deficits/detail LUE Deficits / Details: MMT 5/5 however impaired coordination LUE Sensation: decreased light touch(in hand) LUE Coordination: decreased fine  motor;decreased gross motor    Lower Extremity Assessment Lower Extremity Assessment: Generalized weakness(bilat sequencing and cooridnation impairement)    Cervical / Trunk Assessment Cervical / Trunk Assessment: Normal  Communication   Communication: Expressive difficulties(delayed response time)  Cognition Arousal/Alertness: Awake/alert Behavior During Therapy: Flat affect Overall Cognitive Status: Impaired/Different from baseline Area of Impairment: Problem solving                             Problem Solving: Slow processing;Decreased initiation;Difficulty sequencing;Requires verbal cues;Requires tactile cues General Comments: pt with delayed response time, sequencing, and intiation      General Comments General comments (skin integrity, edema, etc.): pt hooked up to cont EEG however RN reports pt being disconnected and cleared for amb    Exercises     Assessment/Plan    PT Assessment Patient needs continued PT services  PT Problem List Decreased strength;Decreased activity tolerance;Decreased range of motion;Decreased balance;Decreased mobility;Decreased coordination;Decreased cognition;Decreased knowledge of use of DME;Decreased safety awareness;Decreased knowledge of precautions;Impaired sensation       PT Treatment Interventions DME instruction;Gait training;Functional mobility training;Therapeutic activities;Therapeutic exercise;Balance training;Neuromuscular re-education;Cognitive remediation    PT Goals (Current goals can be found in the Care Plan section)  Acute Rehab PT Goals Patient Stated Goal: home PT Goal Formulation: With patient Time For Goal Achievement: 04/15/18 Potential to Achieve Goals: Good Additional Goals Additional Goal #1: Pt to score >19 on DGI to indicate minimal falls risk.    Frequency Min 4X/week   Barriers to discharge Decreased caregiver support home alone during the day    Co-evaluation               AM-PAC  PT "6 Clicks" Daily Activity  Outcome Measure Difficulty turning over in bed (including adjusting bedclothes, sheets and blankets)?: Unable Difficulty moving from lying on back to sitting on the side of the bed? : Unable Difficulty sitting down on and standing up from a chair with arms (e.g., wheelchair, bedside commode, etc,.)?: Unable Help needed moving to and from a bed to chair (including a wheelchair)?: A Lot Help needed walking in hospital room?: A Lot Help needed climbing 3-5 steps with a railing? : A Lot 6 Click Score: 9    End of Session Equipment Utilized During Treatment: Gait belt Activity Tolerance: Patient tolerated treatment well Patient left: in chair;with call bell/phone within reach;with chair alarm set;with nursing/sitter in room Nurse Communication: Mobility status PT Visit Diagnosis: Unsteadiness on feet (R26.81);Difficulty in walking, not elsewhere classified (R26.2)    Time: 3785-8850 PT Time Calculation (min) (ACUTE ONLY): 25 min   Charges:   PT Evaluation $PT Eval Moderate Complexity: 1 Mod PT Treatments $Gait Training: 8-22 mins        Kittie Plater, PT, DPT Acute Rehabilitation Services Pager #: [phone] Office #: [phone]   Berline Lopes 04/01/2018, 11:09 AM

## 2018-04-01 NOTE — Progress Notes (Signed)
Pt admitted from 4N ICU to unit; VSS; telemetry applied and verified with CCMD: second RN called to second verify. Lump to pt's forehead remains unchanged; skin tear to left forearm with foam dsg intact. No pressure ulcer noted. Pt oriented to the unit and room; fall safety precaution and prevention education completed. Bed alarm on; seizure precaution initiated. Pt in bed with call light within reach. Will report off to oncoming RN. Delia Heady RN    04/01/18 1824  Vitals  Temp 98.8 F (37.1 C)  Temp Source Oral  BP (!) 165/81  MAP (mmHg) 103  BP Location Left Arm  BP Method Automatic  Patient Position (if appropriate) Lying  Pulse Rate 83  Pulse Rate Source Dinamap  Resp 18  Oxygen Therapy  SpO2 99 %  O2 Device Room Air  Pain Assessment  Pain Scale 0-10  Pain Score 0

## 2018-04-01 NOTE — Plan of Care (Signed)
D/c coretrack, progressing to carb modified diet and able to feed self. Walked with PT on unit.

## 2018-04-01 NOTE — Evaluation (Signed)
Occupational Therapy Evaluation Patient Details Name: Christina Orozco MRN: [id number] DOB: 1970-06-29 Today's Date: 04/01/2018    History of Present Illness Pt is a 47 yo female admitted 10/14 with multiple seizures in setting of HTN emergency and hyperglycemia. PMH: CVA, HTN, DM, GERD, CKD 2, Asthma, Anemia, Neuropathy. Intubated 10/15-10/18.  EEG interpretation: EEG monitoring with simultaneous video monitoring did not record any clinical subclinical seizures. MRI revealed: Indistinct T2 hyperintensity in the pons that is progressed from 07/25/2017 brain MRI, chronic microhemorrhages in pons progressed from 2018, and remote lacunar infarct in R thalamus.    Clinical Impression   PTA patient independent and working.  Currently admitted for above and limited by impaired cognition (safety, awareness, attention, memory, problem solving, sequencing), impaired balance, decreased activity tolerance, L UE dysmetria, and R inattention.  Patient currently requires min assist for transfers using RW and mobility with +2 for safety, min assist for UB ADLs, mod assist for LB ADLs, and mod assist for toileting.  Poor recall, awareness and problem solving with pathfinding. She will benefit from continued OT services while admitted and after dc at CIR level in order to maximize return to PLOF with ADLs/mobility.  Will continue to follow.     Follow Up Recommendations  CIR;Supervision/Assistance - 24 hour    Equipment Recommendations  Other (comment)(TBD at next venue of care)    Recommendations for Other Services Rehab consult     Precautions / Restrictions Precautions Precautions: Fall Precaution Comments: seizure Restrictions Weight Bearing Restrictions: No      Mobility Bed Mobility               General bed mobility comments: seated OOB in chair upon entry  Transfers Overall transfer level: Needs assistance Equipment used: None Transfers: Sit to/from Stand Sit to Stand: Min  assist;+2 safety/equipment         General transfer comment: sit to stand with min assist for safety and balance, unsteady once standing and attempting mobility therefore used RW    Balance Overall balance assessment: Needs assistance Sitting-balance support: No upper extremity supported;Feet unsupported Sitting balance-Leahy Scale: Fair Sitting balance - Comments: min guard for forward reach to adjust socks for safety   Standing balance support: Bilateral upper extremity supported;During functional activity Standing balance-Leahy Scale: Fair Standing balance comment: dependent on RW, pt very unsteady               High Level Balance Comments: multiple losses of balance with mobility, especially turning and head turns            ADL either performed or assessed with clinical judgement   ADL Overall ADL's : Needs assistance/impaired     Grooming: Sitting;Minimal assistance   Upper Body Bathing: Minimal assistance;Sitting   Lower Body Bathing: Moderate assistance;Sit to/from stand   Upper Body Dressing : Minimal assistance;Sitting   Lower Body Dressing: Moderate assistance;Sit to/from stand   Toilet Transfer: Minimal assistance;+2 for safety/equipment;Ambulation;RW(simulated to recliner ) Toilet Transfer Details (indicate cue type and reason): cueing for hand placement, safety and sequencing  Toileting- Clothing Manipulation and Hygiene: Moderate assistance;Sit to/from stand       Functional mobility during ADLs: Minimal assistance;+2 for safety/equipment;Rolling walker General ADL Comments: Pt requires increased time for processing, decreased attention/memory, impaired dual cognitive task processing and pathfinding.      Vision Baseline Vision/History: Wears glasses Wears Glasses: Reading only Patient Visual Report: No change from baseline Vision Assessment?: Yes Eye Alignment: Within Functional Limits Ocular Range of Motion: Within Functional  Limits Alignment/Gaze Preference: Within Defined Limits Tracking/Visual Pursuits: Able to track stimulus in all quads without difficulty;Decreased smoothness of horizontal tracking;Requires cues, head turns, or add eye shifts to track Additional Comments: nystagmus noted inconsistently with lateral tracking towards L side     Perception Perception Perception Tested?: Yes Perception Deficits: Inattention/neglect Inattention/Neglect: Does not attend to right side of body;Does not attend to right visual field(cueing to attend to R side and visually scan towards R )   Praxis Praxis Praxis tested?: Deficits Deficits: Organization    Pertinent Vitals/Pain Pain Assessment: No/denies pain     Hand Dominance Right   Extremity/Trunk Assessment Upper Extremity Assessment Upper Extremity Assessment: LUE deficits/detail LUE Deficits / Details: MMT 5/5 however impaired coordination LUE Sensation: decreased light touch(hand) LUE Coordination: decreased fine motor;decreased gross motor   Lower Extremity Assessment Lower Extremity Assessment: Defer to PT evaluation   Cervical / Trunk Assessment Cervical / Trunk Assessment: Normal   Communication Communication Communication: Expressive difficulties(delayed response time)   Cognition Arousal/Alertness: Awake/alert Behavior During Therapy: Flat affect;Impulsive Overall Cognitive Status: Impaired/Different from baseline Area of Impairment: Orientation;Attention;Memory;Following commands;Safety/judgement;Awareness;Problem solving                 Orientation Level: Disoriented to;Time(day/date) Current Attention Level: Sustained Memory: Decreased recall of precautions;Decreased short-term memory Following Commands: Follows one step commands inconsistently;Follows one step commands with increased time Safety/Judgement: Decreased awareness of safety;Decreased awareness of deficits Awareness: Emergent Problem Solving: Slow  processing;Decreased initiation;Difficulty sequencing;Requires verbal cues;Requires tactile cues General Comments: pt with delayed response time, impaired safety/awareness   General Comments  VSS. 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        OT Problem List: Decreased activity tolerance;Impaired balance (sitting and/or standing);Impaired vision/perception;Decreased coordination;Decreased cognition;Decreased safety awareness;Decreased knowledge of use of DME or AE;Decreased knowledge of precautions;Impaired sensation      OT Treatment/Interventions: Self-care/ADL training;Neuromuscular education;Therapeutic exercise;Energy conservation;DME and/or AE instruction;Therapeutic activities;Cognitive remediation/compensation;Visual/perceptual remediation/compensation;Patient/family education;Balance training    OT Goals(Current goals can be found in the care plan section) Acute Rehab OT Goals Patient Stated Goal: home OT Goal Formulation: With patient Time For Goal Achievement: 04/15/18 Potential to Achieve Goals: Good  OT Frequency: Min 2X/week   Barriers to D/C:            Co-evaluation              AM-PAC PT "6 Clicks" Daily Activity     Outcome Measure Help from another person eating meals?: A Little Help from another person taking care of personal grooming?: A Little Help from another person  toileting, which includes using toliet, bedpan, or urinal?: A Lot Help from another person bathing (including washing, rinsing, drying)?: A Lot Help from another person to put on and taking off regular upper body clothing?: A Little Help from another person to put on and taking off regular lower body clothing?: A Lot 6 Click Score:  15   End of Session Equipment Utilized During Treatment: Gait belt;Rolling walker Nurse Communication: Mobility status  Activity Tolerance: Patient tolerated treatment well Patient left: in chair;with call bell/phone within reach;with chair alarm set;with family/visitor present  OT Visit Diagnosis: Unsteadiness on feet (R26.81);Other abnormalities of gait and mobility (R26.89);Other symptoms and signs involving the nervous system (R29.898);Other symptoms and signs involving cognitive function                Time: 1610-9604 OT Time Calculation (min): 26 min Charges:  OT General Charges $OT Visit: 1 Visit OT Evaluation $OT Eval Moderate Complexity: 1 Mod OT Treatments $Self Care/Home Management : 8-22 mins  Delight Stare, OT Acute Rehabilitation Services Pager [phone] Office [phone]   Delight Stare 04/01/2018, 2:20 PM

## 2018-04-01 NOTE — Progress Notes (Signed)
[place] TEAM 1 - [place]  KZL:[id number] DOB: Aug 01, 1970 DOA: 03/25/2018 PCP: Lanae Boast, FNP    Brief Narrative:  [phone] with a hx of DM1 on insulin, CVA, hypertension, hyperlipidemia, stage II-III CKD, and chronic grade 1 diastolic dysfunction by TTE 07/26/2017 who was admitted on 03/25/2018 for postictal state and recurrent seizures of unclear etiology with intubation status post extubation on 03/29/2018 with persistent encephalopathy.  Subjective: Awake and alert. Somewhat flat in affect. Denies HA, sob, n/v, chest pain. Reports feeling "weak all over."   Assessment & Plan:  Seizures thought to be secondary to uncontrolled hypertension/PRES versus hypoglycemia - Neurology following   Encephalopathy bacterial meningitis ruled out - appears to be clinically improving - post-ictal +/- hypertensive encephalopathy v/s PRES  History of CVA Continue clopidogrel daily  DM2 CBG quite variable - has just recently resumed regular diet - monitor w/o change in tx today  Hypertension BP climbing above goal - adjust tx and monitor   Hyperlipidemia Continue usual home tx   AKI on Stage II-III CKD baseline creatinine 1.6-2.3 - stable at baseline   Recent Labs  Lab 03/26/18 1843 03/27/18 0414 03/28/18 0322 03/29/18 0324 03/30/18 0550  CREATININE 3.50* 3.92* 3.85* 3.25* 2.42*    DVT prophylaxis: SQ heparin  Code Status: FULL CODE Family Communication: no family present at time of exam  Disposition Plan: transfer to tele bed   Consultants:  PCCM  Neurology   Antimicrobials:  None since 10/18   Objective: Blood pressure (!) 172/81, pulse 92, temperature 98.2 F (36.8 C), temperature source Oral, resp. rate 16, height 5\' 4"  (1.626 m), weight 95.1 kg, last menstrual period 03/12/2018, SpO2 97 %.  Intake/Output Summary (Last 24 hours) at 04/01/2018 1459 Last data filed at 04/01/2018 1100 Gross per 24 hour  Intake 1593.75 ml  Output 1050 ml   Net 543.75 ml   Filed Weights   03/30/18 0416 03/31/18 0454 04/01/18 0500  Weight: 96.7 kg 96.3 kg 95.1 kg    Examination: General: No acute respiratory distress Lungs: Clear to auscultation bilaterally without wheezes or crackles Cardiovascular: Regular rate and rhythm without murmur gallop or rub normal S1 and S2 Abdomen: Nontender, nondistended, soft, bowel sounds positive, no rebound, no ascites, no appreciable mass Extremities: No significant cyanosis, clubbing, or edema bilateral lower extremities  CBC: Recent Labs  Lab 03/30/18 0550 03/31/18 0206 04/01/18 0656  WBC 8.3 8.5 8.3  NEUTROABS 5.0 5.4 4.6  HGB 9.5* 9.2* 9.5*  HCT 30.0* 29.8* 31.8*  MCV 88.8 89.5 91.4  PLT 246 261 009   Basic Metabolic Panel: Recent Labs  Lab 03/27/18 1204 03/28/18 0322 03/28/18 1707 03/29/18 0324 03/30/18 0550  NA  --  135  --  137 144  K  --  3.4*  --  3.7 3.4*  CL  --  108  --  112* 118*  CO2  --  16*  --  15* 20*  GLUCOSE  --  242*  --  255* 171*  BUN  --  47*  --  57* 55*  CREATININE  --  3.85*  --  3.25* 2.42*  CALCIUM  --  7.4*  --  8.0* 8.6*  MG 2.6* 2.6* 2.6*  --   --   PHOS 5.6* 5.5* 3.5  --   --    GFR: Estimated Creatinine Clearance: 33.2 mL/min (A) (by C-G formula based on SCr of 2.42 mg/dL (H)).  Liver Function Tests: Recent Labs  Lab 03/25/18 1916  AST  23  ALT 11  ALKPHOS 83  BILITOT 0.9  PROT 7.6  ALBUMIN 3.5    Coagulation Profile: Recent Labs  Lab 03/27/18 0414  INR 1.31   HbA1C: Hemoglobin A1C  Date/Time Value Ref Range Status  01/11/2018 03:17 PM 10.3 (A) 4.0 - 5.6 % Final  11/09/2017 03:37 PM 8.3 (A) 4.0 - 5.6 % Final   Hgb A1c MFr Bld  Date/Time Value Ref Range Status  07/26/2017 11:18 AM 12.5 (H) 4.8 - 5.6 % Final    Comment:    (NOTE) Pre diabetes:          5.7%-6.4% Diabetes:              >6.4% Glycemic control for   <7.0% adults with diabetes   12/29/2016 02:44 PM 11.3 (H) <5.7 % Final    Comment:      For someone  without known diabetes, a hemoglobin A1c value of 6.5% or greater indicates that they may have diabetes and this should be confirmed with a follow-up test.   For someone with known diabetes, a value <7% indicates that their diabetes is well controlled and a value greater than or equal to 7% indicates suboptimal control. A1c targets should be individualized based on duration of diabetes, age, comorbid conditions, and other considerations.   Currently, no consensus exists for use of hemoglobin A1c for diagnosis of diabetes for children.       CBG: Recent Labs  Lab 03/31/18 2007 03/31/18 2337 04/01/18 0326 04/01/18 0800 04/01/18 1150  GLUCAP 140* 168* 141* 143* 222*    Recent Results (from the past 240 hour(s))  MRSA PCR Screening     Status: None   Collection Time: 03/26/18 12:09 AM  Result Value Ref Range Status   MRSA by PCR NEGATIVE NEGATIVE Final    Comment:        The GeneXpert MRSA Assay (FDA approved for NASAL specimens only), is one component of a comprehensive MRSA colonization surveillance program. It is not intended to diagnose MRSA infection nor to guide or monitor treatment for MRSA infections. Performed at [facility], [place] [address], [place], [place] [address]   CSF culture     Status: None   Collection Time: 03/26/18  7:33 AM  Result Value Ref Range Status   Specimen Description CSF  Final   Special Requests NONE  Final   Gram Stain   Final    CYTOSPIN SMEAR WBC PRESENT, PREDOMINANTLY PMN NO ORGANISMS SEEN    Culture   Final    NO GROWTH 3 DAYS Performed at [facility], [place] [address] [place], [place] [address]    Report Status 03/29/2018 FINAL  Final  Fungus Culture With Stain     Status: None (Preliminary result)   Collection Time: 03/26/18  7:33 AM  Result Value Ref Range Status   Fungus Stain Final report  Final    Comment: (NOTE) Performed At: [facility] [place], [place] [id number] Rush Farmer MD UX:[id number]    Fungus (Mycology) Culture PENDING  Incomplete   Fungal Source CSF  Final    Comment: Performed at [facility], [address], [place], [place] [address]  Fungus Culture Result     Status: None   Collection Time: 03/26/18  7:33 AM  Result Value Ref Range Status   Result 1 Comment  Final    Comment: (NOTE) KOH/Calcofluor preparation:  no fungus observed. Performed At: [facility] [phone]  [place], [place] [id number] Rush Farmer MD YB:[id number]   Culture, blood (Routine X 2) w Reflex to ID Panel     Status: None   Collection Time: 03/26/18 11:37 AM  Result Value Ref Range Status   Specimen Description BLOOD RIGHT HAND  Final   Special Requests   Final    BOTTLES DRAWN AEROBIC ONLY Blood Culture adequate volume   Culture   Final    NO GROWTH 5 DAYS Performed at [facility], [place] [address] [place], [place] [address]    Report Status 03/31/2018 FINAL  Final  Culture, blood (Routine X 2) w Reflex to ID Panel     Status: None   Collection Time: 03/26/18 11:40 AM  Result Value Ref Range Status   Specimen Description BLOOD RIGHT HAND  Final   Special Requests   Final    BOTTLES DRAWN AEROBIC ONLY Blood Culture adequate volume   Culture   Final    NO GROWTH 5 DAYS Performed at [facility], [place] [name], [place], [place] [address]    Report Status 03/31/2018 FINAL  Final     Scheduled Meds: . amLODipine  10 mg Per Tube Daily  . atorvastatin  40 mg Per Tube q1800  . carvedilol  25 mg Per Tube BID WC  . clopidogrel  75 mg Per Tube Daily  . heparin injection (subcutaneous)  5,000 Units Subcutaneous Q8H  . hydrALAZINE  50 mg Per Tube Q6H  . insulin aspart  0-20 Units Subcutaneous Q4H  . insulin detemir  10 Units Subcutaneous Q12H  . levETIRAcetam  500 mg Per Tube BID  . pantoprazole sodium  40 mg Per Tube Daily     LOS: 7 days   Cherene Altes, MD [facility] Office  [phone] Pager - Text  Page per Shea Evans  If 7PM-7AM, please contact night-coverage per Amion 04/01/2018, 2:59 PM

## 2018-04-01 NOTE — Procedures (Signed)
Electroencephalography report.  Long-term monitoring  Recording begins 03/31/2018 at 0730 AM Recording ends 04/01/2018 at 07:30 AM  Date acquisition: International 10-20 for eligible placement.  18 channels EEG with additional eyes limited ipsilateral ears and EKG.     Day 2 CPT code [phone]  This  LTM EEG was requested for this patient with CNS infection and possible seizures to rule out clinical and subclinical electrographic seizures.  Day 1 no pushbutton activation events during this recording.  There was no clinical subclinical seizures present  Waking background activities marked by 8 to 9 cps posterior dominant waking rhythm which tend to attenuates with eyes opening.  Patient had normal drowsiness and sleep architecture  There was no interhemispheric asymmetries, focal abnormalities or epileptiform discharges present.  No clinical subclinical seizures  Day 2 : No clinical subclinical seizures.  No events noted.  Background activities marked by 8 cps somewhat disorganized background activities.  At times appeared to be slightly more prominent slowing across right hemisphere.  No interictal epileptiform discharges present.  Clinical interpretation: This day 2 of intensive EEG monitoring with simultaneous video monitoring did not record any clinical subclinical seizures.  Background activities marked by mild background activity slowing and disorganization.  Possibly some degree of more prominent slowing across right hemisphere however findings are very subtle and intermittent.  There was no evidence of cortical irritability at this time.  Clinical correlation is advised.

## 2018-04-01 NOTE — Progress Notes (Signed)
Reason for consult:   Subjective: Patient no longer having further seizures.  She is drowsy but following commands appropriately and answering questions.   ROS: negative except above  Examination  Vital signs in last 24 hours: Temp:  [98.2 F (36.8 C)-98.6 F (37 C)] 98.3 F (36.8 C) (10/21 0800) Pulse Rate:  [81-92] 92 (10/21 0816) Resp:  [13-30] 16 (10/21 0800) BP: (121-173)/(50-87) 173/87 (10/21 0929) SpO2:  [94 %-99 %] 95 % (10/21 0800) Weight:  [95.1 kg] 95.1 kg (10/21 0500)  General: lying in bed CVS: pulse-normal rate and rhythm RS: breathing comfortably Extremities: normal   Neuro: MS: drowsy but easily arousable   Oriented x3, follows commands CN: pupils equal and reactive,  EOMI, face symmetric, tongue midline, normal sensation over face, Motor: 5/5 strength in all 4 extremities Coordination: normal Gait: not tested  Basic Metabolic Panel: Recent Labs  Lab 03/26/18 0317 03/26/18 1843 03/27/18 0414 03/27/18 1204 03/28/18 0322 03/28/18 1707 03/29/18 0324 03/30/18 0550  NA 132* 136 137  --  135  --  137 144  K 4.1 3.3* 3.6  --  3.4*  --  3.7 3.4*  CL 103 106 107  --  108  --  112* 118*  CO2 15* 19* 18*  --  16*  --  15* 20*  GLUCOSE 457* 148* 119*  --  242*  --  255* 171*  BUN 31* 34* 38*  --  47*  --  57* 55*  CREATININE 3.02* 3.50* 3.92*  --  3.85*  --  3.25* 2.42*  CALCIUM 9.1 8.6* 8.1*  --  7.4*  --  8.0* 8.6*  MG 2.2  --  1.9 2.6* 2.6* 2.6*  --   --   PHOS 3.8  --   --  5.6* 5.5* 3.5  --   --     CBC: Recent Labs  Lab 03/25/18 1916  03/27/18 0414 03/29/18 0324 03/30/18 0550 03/31/18 0206 04/01/18 0656  WBC 8.2   < > 16.2* 11.7* 8.3 8.5 8.3  NEUTROABS 5.7  --   --  9.3* 5.0 5.4 4.6  HGB 11.6*   < > 9.7* 9.3* 9.5* 9.2* 9.5*  HCT 36.2   < > 30.9* 28.9* 30.0* 29.8* 31.8*  MCV 87.2   < > 88.5 88.7 88.8 89.5 91.4  PLT 249   < > 201 206 246 261 300   < > = values in this interval not displayed.     Coagulation Studies: No results for  input(s): LABPROT, INR in the last 72 hours.  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 No obvious evidence of PRES on MRI brain, does show signal change in pons on unclear etiology.   Impression  New onset seizures, unclear etioology Encephalopathy-possibly hypertensive emergency  Plan Continue Keppra  D/C LTM EEG Seizure precautions, no driving x 35months    We will continue to follow.  Karena Addison Aroor [facility] Pager Number [phone] For questions after 7pm please refer to AMION to reach the Neurologist on call

## 2018-04-01 NOTE — Progress Notes (Signed)
  Speech Language Pathology Treatment:    Patient Details Name: Christina Orozco MRN: [id number] DOB: 14-Feb-1971 Today's Date: 04/01/2018 Time: [id number] SLP Time Calculation (min) (ACUTE ONLY): 35 min  Assessment / Plan / Recommendation Clinical Impression  Pt demonstrates much improved mentation, fully alert, thirsty and impulsive with Cortrak still in place. Pt consumed a full pitcher of water, taking consecutive sips to drink almost 4 oz at once. After each trial she was somewhat breathless after, but vocal quality was dry. An occasional productive cough was present in between trials. Overall suspect pt is still recovering from a mild post extubation dysphagia, complicated by presence of NG tube. Despite this, pt appears capable of protecting her airway with low risk of aspiration pna and should initiate a regular diet and thin liquids.  Removal of NG tube will improve swallow function as well. Will follow as needed until tolerance established.     HPI HPI: Raguel [name] an 47 y.o.femalepresented with new onset seizures and has remained encephalopathic. CSF examination suggestive of bacterial meningitis although Gram stain and cultures are negative. 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 PMH: CVA, hypertension, diabetes, GERD, CKD stage II, asthma, anemia, and neuropathy. Intubted 10/14-10/18 . CXR no focal lung disease.      SLP Plan          Recommendations  Diet recommendations: Regular;Thin liquid Liquids provided via: Cup;Straw Medication Administration: Whole meds with liquid Supervision: Intermittent supervision to cue for compensatory strategies                Oral Care Recommendations: Oral care BID Follow up Recommendations: 24 hour supervision/assistance SLP Visit Diagnosis: Dysphagia, unspecified (R13.10)       GO               Herbie Baltimore, MA [place] Pager [phone] Office  [phone]  Lynann Beaver 04/01/2018, 9:42 AM

## 2018-04-01 NOTE — Progress Notes (Signed)
Late entry Speech Pathology    03/30/18 1508  SLP Visit Information  SLP Received On 03/30/18  General Information  HPI Christina [name] an 47 y.o.femalepresented with new onset seizures and has remained encephalopathic. CSF examination suggestive of bacterial meningitis although Gram stain and cultures are negative. 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 PMH: CVA, hypertension, diabetes, GERD, CKD stage II, asthma, anemia, and neuropathy. Intubted 10/14-10/18 . CXR no focal lung disease.  Type of Study Bedside Swallow Evaluation  Previous Swallow Assessment  (none)  Diet Prior to this Study NPO;NG Tube  Temperature Spikes Noted No  Respiratory Status Room air  History of Recent Intubation Yes  Length of Intubations (days) 4 days  Date extubated 03/29/18  Behavior/Cognition Alert;Cooperative;Requires cueing  Oral Cavity Assessment WFL  Oral Care Completed by SLP Yes  Oral Cavity - Dentition Adequate natural dentition  Vision Functional for self-feeding  Self-Feeding Abilities Able to feed self  Patient Positioning Upright in bed  Baseline Vocal Quality Low vocal intensity  Volitional Cough Strong  Volitional Swallow Able to elicit  Pain Assessment  Pain Assessment Faces  Faces Pain Scale 4  Pain Intervention(s) Monitored during session;Repositioned  Oral Assessment (Complete on admission/transfer/change in patient condition)  Does patient have any of the following "high(er) risk" factors? Diet - patient on tube feedings  Oral Motor/Sensory Function  Overall Oral Motor/Sensory Function WFL  Ice Chips  Ice chips NT  Thin Liquid  Thin Liquid Impaired  Presentation Cup;Straw  Oral Phase Impairments  (none)  Oral Phase Functional Implications  (none)  Pharyngeal  Phase Impairments Cough - Delayed;Other (comments) (dyspnea following swallows)  Nectar Thick Liquid  Nectar Thick Liquid NT  Honey Thick Liquid  Honey Thick Liquid NT  Puree   Puree Impaired  Presentation Spoon;Self Fed  Oral Phase Impairments  (none)  Oral Phase Functional Implications  (none)  Pharyngeal Phase Impairments Cough - Delayed  Solid  Solid NT  SLP - End of Session  Patient left in bed  Nurse Communication Treatment plan  Suspected Esophageal Findings  Suspected Esophageal Findings Belching  SLP Assessment  Clinical Impression Statement (ACUTE ONLY) Adequately alert, followed commands although processing mildly delayed and mildly impaired pragmatics and overall affect. Oral-motor exam unremarkable. (+) coughing delayed following presentations of consecutive 3 oz water trial via cup, pudding and straw sips water. Increased work of breathing after most swallows indicative of possible airway  intrusion. Given 4 day intubation, current cognitive deficits pt is at increased aspiration risk. Continue NPO except for ice chips intermittently following oral care and ST will continue efforts toward safe oral nutrition.        SLP Visit Diagnosis Dysphagia, unspecified (R13.10)  Impact on safety and function Mild aspiration risk  Other Related Risk Factors Previous CVA;Cognitive impairment;Prolonged intubation;Deconditioning;Decreased respiratory status  Swallow Evaluation Recommendations  SLP Diet Recommendations NPO;Ice chips PRN after oral care  Medication Administration Via alternative means  Treatment Plan  Oral Care Recommendations Oral care QID;Oral care prior to ice chip/H20  Treatment Recommendations Therapy as outlined in treatment plan below  Follow up Recommendations  (TBD)  Speech Therapy Frequency (ACUTE ONLY) min 2x/week  Treatment Duration 2 weeks  Interventions Patient/family education;Trials of upgraded texture/liquids  Prognosis  Prognosis for Safe Diet Advancement Good  Barriers to Reach Goals Cognitive deficits  Individuals Consulted  Consulted and Agree with Results and Recommendations Patient;RN  SLP Time Calculation  SLP  Start Time (ACUTE ONLY) 1509  SLP Stop Time (ACUTE ONLY) 1523  SLP Time Calculation (min) (ACUTE ONLY) 14 min  SLP Evaluations  $ SLP Speech Visit 1 Visit    Orbie Pyo [place] M.Ed Risk analyst [phone] Office [phone]

## 2018-04-02 ENCOUNTER — Other Ambulatory Visit: Payer: Self-pay

## 2018-04-02 ENCOUNTER — Inpatient Hospital Stay (HOSPITAL_COMMUNITY)
Admission: RE | Admit: 2018-04-02 | Discharge: 2018-04-13 | DRG: 091 | Disposition: A | Discharge status: Home Health | Payer: BLUE CROSS/BLUE SHIELD | Source: Intra-hospital | Attending: Physical Medicine & Rehabilitation | Admitting: Physical Medicine & Rehabilitation

## 2018-04-02 ENCOUNTER — Ambulatory Visit: Payer: BLUE CROSS/BLUE SHIELD | Admitting: Cardiovascular Disease

## 2018-04-02 ENCOUNTER — Encounter (HOSPITAL_COMMUNITY): Payer: Self-pay

## 2018-04-02 DIAGNOSIS — B379 Candidiasis, unspecified: Secondary | ICD-10-CM | POA: Diagnosis not present

## 2018-04-02 DIAGNOSIS — I5189 Other ill-defined heart diseases: Secondary | ICD-10-CM | POA: Diagnosis not present

## 2018-04-02 DIAGNOSIS — D631 Anemia in chronic kidney disease: Secondary | ICD-10-CM | POA: Diagnosis present

## 2018-04-02 DIAGNOSIS — N189 Chronic kidney disease, unspecified: Secondary | ICD-10-CM

## 2018-04-02 DIAGNOSIS — D649 Anemia, unspecified: Secondary | ICD-10-CM | POA: Diagnosis not present

## 2018-04-02 DIAGNOSIS — F419 Anxiety disorder, unspecified: Secondary | ICD-10-CM | POA: Diagnosis present

## 2018-04-02 DIAGNOSIS — J4520 Mild intermittent asthma, uncomplicated: Secondary | ICD-10-CM

## 2018-04-02 DIAGNOSIS — Z803 Family history of malignant neoplasm of breast: Secondary | ICD-10-CM | POA: Diagnosis not present

## 2018-04-02 DIAGNOSIS — R0602 Shortness of breath: Secondary | ICD-10-CM

## 2018-04-02 DIAGNOSIS — Z9119 Patient's noncompliance with other medical treatment and regimen: Secondary | ICD-10-CM

## 2018-04-02 DIAGNOSIS — E46 Unspecified protein-calorie malnutrition: Secondary | ICD-10-CM | POA: Diagnosis not present

## 2018-04-02 DIAGNOSIS — I10 Essential (primary) hypertension: Secondary | ICD-10-CM | POA: Diagnosis not present

## 2018-04-02 DIAGNOSIS — I6783 Posterior reversible encephalopathy syndrome: Secondary | ICD-10-CM

## 2018-04-02 DIAGNOSIS — E785 Hyperlipidemia, unspecified: Secondary | ICD-10-CM

## 2018-04-02 DIAGNOSIS — Z23 Encounter for immunization: Secondary | ICD-10-CM

## 2018-04-02 DIAGNOSIS — E1159 Type 2 diabetes mellitus with other circulatory complications: Secondary | ICD-10-CM

## 2018-04-02 DIAGNOSIS — Z79899 Other long term (current) drug therapy: Secondary | ICD-10-CM

## 2018-04-02 DIAGNOSIS — G40909 Epilepsy, unspecified, not intractable, without status epilepticus: Secondary | ICD-10-CM | POA: Diagnosis present

## 2018-04-02 DIAGNOSIS — J189 Pneumonia, unspecified organism: Secondary | ICD-10-CM

## 2018-04-02 DIAGNOSIS — Z6838 Body mass index (BMI) 38.0-38.9, adult: Secondary | ICD-10-CM

## 2018-04-02 DIAGNOSIS — N183 Chronic kidney disease, stage 3 (moderate): Secondary | ICD-10-CM

## 2018-04-02 DIAGNOSIS — R0981 Nasal congestion: Secondary | ICD-10-CM

## 2018-04-02 DIAGNOSIS — R569 Unspecified convulsions: Secondary | ICD-10-CM

## 2018-04-02 DIAGNOSIS — E8809 Other disorders of plasma-protein metabolism, not elsewhere classified: Secondary | ICD-10-CM

## 2018-04-02 DIAGNOSIS — N184 Chronic kidney disease, stage 4 (severe): Secondary | ICD-10-CM

## 2018-04-02 DIAGNOSIS — E441 Mild protein-calorie malnutrition: Secondary | ICD-10-CM | POA: Diagnosis present

## 2018-04-02 DIAGNOSIS — E1165 Type 2 diabetes mellitus with hyperglycemia: Secondary | ICD-10-CM | POA: Diagnosis not present

## 2018-04-02 DIAGNOSIS — E119 Type 2 diabetes mellitus without complications: Secondary | ICD-10-CM

## 2018-04-02 DIAGNOSIS — E1142 Type 2 diabetes mellitus with diabetic polyneuropathy: Secondary | ICD-10-CM | POA: Diagnosis present

## 2018-04-02 DIAGNOSIS — J45909 Unspecified asthma, uncomplicated: Secondary | ICD-10-CM | POA: Diagnosis present

## 2018-04-02 DIAGNOSIS — Z7902 Long term (current) use of antithrombotics/antiplatelets: Secondary | ICD-10-CM

## 2018-04-02 DIAGNOSIS — J90 Pleural effusion, not elsewhere classified: Secondary | ICD-10-CM | POA: Diagnosis not present

## 2018-04-02 DIAGNOSIS — I129 Hypertensive chronic kidney disease with stage 1 through stage 4 chronic kidney disease, or unspecified chronic kidney disease: Secondary | ICD-10-CM | POA: Diagnosis present

## 2018-04-02 DIAGNOSIS — R2689 Other abnormalities of gait and mobility: Principal | ICD-10-CM | POA: Diagnosis present

## 2018-04-02 DIAGNOSIS — E1122 Type 2 diabetes mellitus with diabetic chronic kidney disease: Secondary | ICD-10-CM

## 2018-04-02 DIAGNOSIS — Z8249 Family history of ischemic heart disease and other diseases of the circulatory system: Secondary | ICD-10-CM | POA: Diagnosis not present

## 2018-04-02 DIAGNOSIS — D62 Acute posthemorrhagic anemia: Secondary | ICD-10-CM | POA: Diagnosis present

## 2018-04-02 DIAGNOSIS — Z8673 Personal history of transient ischemic attack (TIA), and cerebral infarction without residual deficits: Secondary | ICD-10-CM

## 2018-04-02 DIAGNOSIS — F329 Major depressive disorder, single episode, unspecified: Secondary | ICD-10-CM | POA: Diagnosis present

## 2018-04-02 DIAGNOSIS — J181 Lobar pneumonia, unspecified organism: Secondary | ICD-10-CM | POA: Diagnosis not present

## 2018-04-02 DIAGNOSIS — Z0189 Encounter for other specified special examinations: Secondary | ICD-10-CM

## 2018-04-02 DIAGNOSIS — K219 Gastro-esophageal reflux disease without esophagitis: Secondary | ICD-10-CM | POA: Diagnosis present

## 2018-04-02 DIAGNOSIS — Z794 Long term (current) use of insulin: Secondary | ICD-10-CM

## 2018-04-02 DIAGNOSIS — E876 Hypokalemia: Secondary | ICD-10-CM | POA: Diagnosis not present

## 2018-04-02 DIAGNOSIS — Z862 Personal history of diseases of the blood and blood-forming organs and certain disorders involving the immune mechanism: Secondary | ICD-10-CM

## 2018-04-02 LAB — CREATININE, SERUM
Creatinine, Ser: 1.82 mg/dL — ABNORMAL HIGH (ref 0.44–1.00)
GFR calc Af Amer: 38 mL/min — ABNORMAL LOW (ref >60)
GFR calc non Af Amer: 33 mL/min — ABNORMAL LOW (ref >60)

## 2018-04-02 LAB — GLUCOSE, CAPILLARY
Glucose-Capillary: 193 mg/dL — ABNORMAL HIGH (ref 70–99)
Glucose-Capillary: 228 mg/dL — ABNORMAL HIGH (ref 70–99)
Glucose-Capillary: 145 mg/dL — ABNORMAL HIGH (ref 70–99)

## 2018-04-02 MED ORDER — INSULIN ASPART 100 UNIT/ML ~~LOC~~ SOLN: 0.0000 [IU] | Freq: Every day | SUBCUTANEOUS | 0 refills | Status: DC

## 2018-04-02 MED ORDER — LEVETIRACETAM 500 MG PO TABS
500.0000 mg | ORAL_TABLET | Freq: Two times a day (BID) | ORAL | Status: DC
  Administered 2018-04-02 – 2018-04-13 (×22): 500 mg via ORAL
  Filled 2018-04-02 (×22): qty 1

## 2018-04-02 MED ORDER — IPRATROPIUM-ALBUTEROL 0.5-2.5 (3) MG/3ML IN SOLN
3.0000 mL | Freq: Four times a day (QID) | RESPIRATORY_TRACT | Status: DC
  Administered 2018-04-02: 3 mL via RESPIRATORY_TRACT
  Filled 2018-04-02: qty 3

## 2018-04-02 MED ORDER — PANTOPRAZOLE SODIUM 40 MG PO TBEC
40.0000 mg | DELAYED_RELEASE_TABLET | Freq: Every day | ORAL | Status: DC
  Administered 2018-04-03 – 2018-04-13 (×11): 40 mg via ORAL
  Filled 2018-04-02 (×11): qty 1

## 2018-04-02 MED ORDER — INSULIN ASPART 100 UNIT/ML ~~LOC~~ SOLN
0.0000 [IU] | Freq: Three times a day (TID) | SUBCUTANEOUS | Status: DC
  Administered 2018-04-03: 4 [IU] via SUBCUTANEOUS
  Administered 2018-04-03: 11 [IU] via SUBCUTANEOUS
  Administered 2018-04-03 – 2018-04-04 (×2): 4 [IU] via SUBCUTANEOUS
  Administered 2018-04-04 (×2): 7 [IU] via SUBCUTANEOUS
  Administered 2018-04-05: 4 [IU] via SUBCUTANEOUS
  Administered 2018-04-05: 3 [IU] via SUBCUTANEOUS
  Administered 2018-04-05: 7 [IU] via SUBCUTANEOUS
  Administered 2018-04-06 (×2): 4 [IU] via SUBCUTANEOUS
  Administered 2018-04-06: 7 [IU] via SUBCUTANEOUS
  Administered 2018-04-07 (×2): 4 [IU] via SUBCUTANEOUS
  Administered 2018-04-07 – 2018-04-08 (×2): 7 [IU] via SUBCUTANEOUS
  Administered 2018-04-08: 4 [IU] via SUBCUTANEOUS
  Administered 2018-04-09: 3 [IU] via SUBCUTANEOUS
  Administered 2018-04-09 (×2): 4 [IU] via SUBCUTANEOUS
  Administered 2018-04-10: 7 [IU] via SUBCUTANEOUS
  Administered 2018-04-11: 4 [IU] via SUBCUTANEOUS
  Administered 2018-04-12 (×3): 3 [IU] via SUBCUTANEOUS

## 2018-04-02 MED ORDER — CLOPIDOGREL BISULFATE 75 MG PO TABS
75.0000 mg | ORAL_TABLET | Freq: Every day | ORAL | Status: DC
  Administered 2018-04-03 – 2018-04-13 (×11): 75 mg via ORAL
  Filled 2018-04-02 (×11): qty 1

## 2018-04-02 MED ORDER — CARVEDILOL 25 MG PO TABS
25.0000 mg | ORAL_TABLET | Freq: Two times a day (BID) | ORAL | Status: DC
  Administered 2018-04-03 – 2018-04-13 (×21): 25 mg via ORAL
  Filled 2018-04-02 (×21): qty 1

## 2018-04-02 MED ORDER — HEPARIN SODIUM (PORCINE) 5000 UNIT/ML IJ SOLN: 5000.0000 [IU] | Freq: Three times a day (TID) | INTRAMUSCULAR | Status: DC

## 2018-04-02 MED ORDER — ACETAMINOPHEN 325 MG PO TABS
650.0000 mg | ORAL_TABLET | Freq: Four times a day (QID) | ORAL | Status: DC | PRN
  Administered 2018-04-06: 650 mg via ORAL
  Filled 2018-04-02 (×2): qty 2

## 2018-04-02 MED ORDER — ALBUTEROL SULFATE (2.5 MG/3ML) 0.083% IN NEBU
2.5000 mg | INHALATION_SOLUTION | RESPIRATORY_TRACT | Status: DC | PRN
  Administered 2018-04-02 – 2018-04-07 (×12): 2.5 mg via RESPIRATORY_TRACT
  Filled 2018-04-02 (×11): qty 3

## 2018-04-02 MED ORDER — DOXAZOSIN MESYLATE 2 MG PO TABS
2.0000 mg | ORAL_TABLET | Freq: Every day | ORAL | Status: DC
  Administered 2018-04-03 – 2018-04-04 (×2): 2 mg via ORAL
  Filled 2018-04-02 (×4): qty 1

## 2018-04-02 MED ORDER — AMLODIPINE BESYLATE 10 MG PO TABS
10.0000 mg | ORAL_TABLET | Freq: Every day | ORAL | Status: DC
  Administered 2018-04-03 – 2018-04-13 (×11): 10 mg via ORAL
  Filled 2018-04-02 (×12): qty 1

## 2018-04-02 MED ORDER — DOXAZOSIN MESYLATE 2 MG PO TABS
2.0000 mg | ORAL_TABLET | Freq: Every day | ORAL | Status: DC
  Administered 2018-04-02: 2 mg via ORAL
  Filled 2018-04-02: qty 1

## 2018-04-02 MED ORDER — ATORVASTATIN CALCIUM 40 MG PO TABS
40.0000 mg | ORAL_TABLET | Freq: Every day | ORAL | Status: DC
  Administered 2018-04-03 – 2018-04-12 (×10): 40 mg via ORAL
  Filled 2018-04-02 (×10): qty 1

## 2018-04-02 MED ORDER — HYDRALAZINE HCL 50 MG PO TABS
100.0000 mg | ORAL_TABLET | Freq: Four times a day (QID) | ORAL | Status: DC
  Administered 2018-04-03 – 2018-04-13 (×42): 100 mg via ORAL
  Filled 2018-04-02 (×42): qty 2

## 2018-04-02 MED ORDER — HEPARIN SODIUM (PORCINE) 5000 UNIT/ML IJ SOLN
5000.0000 [IU] | Freq: Three times a day (TID) | INTRAMUSCULAR | Status: DC
  Administered 2018-04-03 – 2018-04-13 (×32): 5000 [IU] via SUBCUTANEOUS
  Filled 2018-04-02 (×32): qty 1

## 2018-04-02 MED ORDER — OXYCODONE HCL 5 MG PO TABS
5.0000 mg | ORAL_TABLET | ORAL | Status: DC | PRN
  Administered 2018-04-09 – 2018-04-10 (×2): 5 mg via ORAL
  Filled 2018-04-02 (×3): qty 1

## 2018-04-02 MED ORDER — PANTOPRAZOLE SODIUM 40 MG PO TBEC: 40.0000 mg | DELAYED_RELEASE_TABLET | Freq: Every day | ORAL | 0 refills | Status: DC

## 2018-04-02 MED ORDER — LEVETIRACETAM 500 MG PO TABS: 500.0000 mg | ORAL_TABLET | Freq: Two times a day (BID) | ORAL | 0 refills | Status: DC

## 2018-04-02 MED ORDER — INFLUENZA VAC SPLIT QUAD 0.5 ML IM SUSY
0.5000 mL | PREFILLED_SYRINGE | INTRAMUSCULAR | Status: AC
  Administered 2018-04-03: 0.5 mL via INTRAMUSCULAR
  Filled 2018-04-02: qty 0.5

## 2018-04-02 MED ORDER — INSULIN ASPART 100 UNIT/ML ~~LOC~~ SOLN: 0.0000 [IU] | Freq: Three times a day (TID) | SUBCUTANEOUS | 0 refills | Status: DC

## 2018-04-02 MED ORDER — LEVETIRACETAM 500 MG PO TABS: 500.0000 mg | ORAL_TABLET | Freq: Two times a day (BID) | ORAL | Status: DC

## 2018-04-02 MED ORDER — PANTOPRAZOLE SODIUM 40 MG PO TBEC: 40.0000 mg | DELAYED_RELEASE_TABLET | Freq: Every day | ORAL | Status: DC

## 2018-04-02 MED ORDER — DOXAZOSIN MESYLATE 4 MG PO TABS: 2.0000 mg | ORAL_TABLET | Freq: Every day | ORAL | 0 refills | Status: DC

## 2018-04-02 MED ORDER — BISACODYL 10 MG RE SUPP
10.0000 mg | Freq: Every day | RECTAL | Status: DC | PRN
  Filled 2018-04-02: qty 1

## 2018-04-02 MED ORDER — FLUTICASONE PROPIONATE 50 MCG/ACT NA SUSP
1.0000 | NASAL | Status: DC | PRN
  Administered 2018-04-03: 1 via NASAL
  Filled 2018-04-02: qty 16

## 2018-04-02 NOTE — H&P (Signed)
Physical Medicine and Rehabilitation Admission H&P    Chief Complaint  Patient presents with  . 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  Per chart review and patient, patient lives with boyfriend and 46 year old daughter.  Independent prior to admission.  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  One level home.  Boyfriend works during the day.  Presented 03/25/2018 with seizure requiring intubation for airway protection.  Noted systolic blood pressure in the 200s as well as elevated blood sugars.  Hernial CT scan reviewed unremarkable for acute intracranial process.  CT angiogram of head and neck with no dissection aneurysm or significant stenosis.  Urine drug screen negative.  MRI of the brain showed indistinct T2 hyperintensity in the pons that progressed from 07/25/2017 MRI.  The patient has had fluctuating pontine signals when compared to studies in 2018 and 2019 favoring atypical PRES or osmotic demyelination.  Latest EEG showing a mixture of theta and alpha rhythms seen from the central and temporal regions but did not record any clinical subclinical seizures.  Currently maintained on Keppra for seizure prophylaxis.  Plavix initiated for CVA prophylaxis.  Subcutaneous heparin for DVT prophylaxis.  Initially with nasogastric tube for nutritional support and diet has been advanced to regular.  Therapy evaluations completed with recommendations of physical medicine rehab consult.  Patient was admitted for a comprehensive rehab program.  Review of Systems  Constitutional: Negative for chills and fever.  HENT: Negative for hearing loss.   Eyes: Negative for blurred vision and double vision.  Respiratory: Negative for cough and shortness of breath.   Cardiovascular: Positive for leg swelling. Negative for chest pain and palpitations.  Gastrointestinal: Positive for  constipation. Negative for nausea and vomiting.       GERD  Genitourinary: Negative for dysuria, flank pain and hematuria.  Musculoskeletal: Positive for myalgias.  Skin: Negative for rash.  Neurological: Positive for seizures and headaches.  Psychiatric/Behavioral: The patient has insomnia.   All other systems reviewed and are negative.  Past Medical History:  Diagnosis Date  . Anemia    severe  . Asthma   . CKD (chronic kidney disease)    stage IV  . DDD (degenerative disc disease), cervical   . Diabetes mellitus ([place])   . Dislocated shoulder    right  . GERD (gastroesophageal reflux disease)   . Hypertension   . Neuropathy   . Stroke [facility])    Past Surgical History:  Procedure Laterality Date  . ADENOIDECTOMY    . APPENDECTOMY    . CERVICAL SPINE SURGERY  06/2012   C5-C6  . EYE SURGERY     left eye surgery   . FOOT SURGERY    . LOOP RECORDER INSERTION N/A 08/27/2017   Procedure: LOOP RECORDER INSERTION;  Surgeon: Sanda Klein, MD;  Location: [facility];  Service: Cardiovascular;  Laterality: N/A;  . SHOULDER SURGERY    . TEE WITHOUT CARDIOVERSION N/A 08/27/2017   Procedure: TRANSESOPHAGEAL ECHOCARDIOGRAM (TEE);  Surgeon: Sanda Klein, MD;  Location: [facility] ENDOSCOPY;  Service: Cardiovascular;  Laterality: N/A;  . TONSILLECTOMY     Family History  Problem Relation Age of Onset  . Vascular Disease Mother   . CAD Mother   . Heart failure Mother   . Heart disease Unknown   . Cancer Unknown        colon, stomach, lung  .  Diabetes Unknown   . Seizures Unknown   . Breast cancer Sister 44   Social History:  reports that she has never smoked. She has never used smokeless tobacco. She reports that she does not drink alcohol or use drugs. Allergies:  Allergies  Allergen Reactions  . Lisinopril Swelling and Other (See Comments)    Facial swelling   . Penicillins Hives    Has patient had a PCN reaction causing immediate rash, facial/tongue/throat swelling, SOB  or lightheadedness with hypotension: yes Has patient had a PCN reaction causing severe rash involving mucus membranes or skin necrosis: No Has patient had a PCN reaction that required hospitalization No Has patient had a PCN reaction occurring within the last 10 years: No If all of the above answers are "NO", then may proceed wit  . Gabapentin Swelling and Other (See Comments)    "I thought I was having another stroke."    Medications Prior to Admission  Medication Sig Dispense Refill  . amLODipine (NORVASC) 10 MG tablet Take 1 tablet (10 mg total) by mouth daily. 90 tablet 3  . clopidogrel (PLAVIX) 75 MG tablet Take 1 tablet (75 mg total) by mouth daily. 90 tablet 1  . doxazosin (CARDURA) 4 MG tablet Take 1 tablet (4 mg total) by mouth daily. 90 tablet 1  . furosemide (LASIX) 80 MG tablet Take 1 tablet (80 mg total) by mouth 2 (two) times daily. 180 tablet 1  . hydrALAZINE (APRESOLINE) 100 MG tablet Take 1 tablet (100 mg total) by mouth 3 (three) times daily. 90 tablet 5  . Insulin Degludec (TRESIBA FLEXTOUCH) 200 UNIT/ML SOPN Inject 60 Units into the skin daily. And pen needles 1/day 6 pen 11  . insulin glargine (LANTUS) 100 UNIT/ML injection Inject 70 Units into the skin every morning.    . meclizine (ANTIVERT) 25 MG tablet TAKE 1 TABLET(25 MG) BY MOUTH EVERY 8 HOURS AS NEEDED FOR DIZZINESS (Patient taking differently: Take 25 mg by mouth every 8 (eight) hours as needed for dizziness. ) 90 tablet 0  . atorvastatin (LIPITOR) 40 MG tablet TAKE 1 TABLET BY MOUTH EVERY EVENING AT 6PM (Patient not taking: No sig reported) 30 tablet 0  . carvedilol (COREG) 25 MG tablet Take 1 tablet (25 mg total) by mouth 2 (two) times daily with a meal. Take 25 mg twice daily (Patient not taking: Reported on 03/26/2018) 180 tablet 3  . ferrous sulfate 325 (65 FE) MG tablet Take 1 tablet (325 mg total) by mouth daily. (Patient not taking: Reported on 03/26/2018) 30 tablet 3  . glucose blood (CONTOUR NEXT TEST) test  strip 1 each by Other route 2 (two) times daily. And lancets 2/day 100 each 12  . PROAIR HFA 108 (90 Base) MCG/ACT inhaler INHALE 1 OR 2 PUFFS INTO THE LUNGS EVERY 6 HOURS AS NEEDED FOR WHEEZING OR SHORTNESS OF BREATH (Patient not taking: No sig reported) 8.5 g 0    Drug Regimen Review Drug regimen was reviewed and remains appropriate with no significant issues identified  Home: Home Living Family/patient expects to be discharged to:: Private residence Living Arrangements: Spouse/significant other Available Help at Discharge: Available PRN/intermittently Type of Home: Apartment Home Access: Level entry Home Layout: One level Bathroom Shower/Tub: Tub/shower unit, Architectural technologist: Standard Bathroom Accessibility: No Home Equipment: Grab bars - toilet, Grab bars - tub/shower  Lives With: Family   Functional History: Prior Function Level of Independence: Independent 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  Functional Status:  Mobility: Bed Mobility Overal bed mobility: Needs Assistance Bed Mobility: Supine to Sit Supine to sit: Min guard, HOB elevated General bed mobility comments: seated OOB in chair upon entry Transfers Overall transfer level: Needs assistance Equipment used: None Transfers: Sit to/from Stand Sit to Stand: Min assist, +2 safety/equipment General transfer comment: sit to stand with min assist for safety and balance, unsteady once standing and attempting mobility therefore used RW Ambulation/Gait Ambulation/Gait assistance: Mod assist, +2 safety/equipment Gait Distance (Feet): 150 Feet Assistive device: Rolling walker (2 wheeled) Gait Pattern/deviations: Step-through pattern, Decreased stride length, Staggering left, Staggering right General Gait Details: attempted to amb without AD as PTA however pt quickly had an episode of LOB requiring maxA to prevenet fall. pt with impaired trunk control, sequencing, motor planning and control. Pt reports  "i feel off, wierd" pt reports sensation of pins and needles in hands and feet. modA for walker management Gait velocity: slow Gait velocity interpretation: <1.8 ft/sec, indicate of risk for recurrent falls    ADL: ADL Overall ADL's : Needs assistance/impaired Grooming: Sitting, Minimal assistance Upper Body Bathing: Minimal assistance, Sitting Lower Body Bathing: Moderate assistance, Sit to/from stand Upper Body Dressing : Minimal assistance, Sitting Lower Body Dressing: Moderate assistance, Sit to/from stand Toilet Transfer: Minimal assistance, +2 for safety/equipment, Ambulation, RW(simulated to recliner ) Toilet Transfer Details (indicate cue type and reason): cueing for hand placement, safety and sequencing  Toileting- Clothing Manipulation and Hygiene: Moderate assistance, Sit to/from stand Functional mobility during ADLs: Minimal assistance, +2 for safety/equipment, Rolling walker General ADL Comments: Pt requires increased time for processing, decreased attention/memory, impaired dual cognitive task processing and pathfinding.   Cognition: Cognition Overall Cognitive Status: Impaired/Different from baseline Arousal/Alertness: Awake/alert Orientation Level: Oriented X4 Attention: Alternating Alternating Attention: Appears intact Memory: Impaired Memory Impairment: Storage deficit, Decreased short term memory Decreased Short Term Memory: Verbal basic Awareness: Impaired Awareness Impairment: Emergent impairment Executive Function: Self Monitoring Self Monitoring: Impaired Self Monitoring Impairment: Verbal basic, Functional basic Behaviors: Other (comment)(flat affect) Safety/Judgment: Impaired Cognition Arousal/Alertness: Awake/alert Behavior During Therapy: Flat affect, Impulsive Overall Cognitive Status: Impaired/Different from baseline Area of Impairment: Orientation, Attention, Memory, Following commands, Safety/judgement, Awareness, Problem solving Orientation  Level: Disoriented to, Time(day/date) Current Attention Level: Sustained Memory: Decreased recall of precautions, Decreased short-term memory Following Commands: Follows one step commands inconsistently, Follows one step commands with increased time Safety/Judgement: Decreased awareness of safety, Decreased awareness of deficits Awareness: Emergent Problem Solving: Slow processing, Decreased initiation, Difficulty sequencing, Requires verbal cues, Requires tactile cues General Comments: pt with delayed response time, impaired safety/awareness  Physical Exam: Blood pressure (!) 152/72, pulse 85, temperature 99.2 F (37.3 C), temperature source Oral, resp. rate 20, height 5\' 4"  (1.626 m), weight 95.1 kg, last menstrual period 03/12/2018, SpO2 97 %. Physical Exam  Constitutional: No distress.  HENT:  Head: Normocephalic.  Eyes: Pupils are equal, round, and reactive to light.  Neck: Normal range of motion.  Cardiovascular: Normal rate and regular rhythm.  Respiratory: Effort normal and breath sounds normal.  GI: Soft. She exhibits no distension. There is no tenderness.  Musculoskeletal: She exhibits edema.  Neurological:  Patient is alert and makes good eye contact..Mood is a bit flat.provides her name ,age.Follows simple commands.She can not recall her full hospital course. Moves all 4 limbs. Senses pain in all 4's  Psychiatric:  flat    Results for orders placed or performed during the hospital encounter of 03/25/18 (from the past 48 hour(s))  Glucose, capillary     Status: Abnormal  Collection Time: 03/31/18 12:44 PM  Result Value Ref Range   Glucose-Capillary 161 (H) 70 - 99 mg/dL   Comment 1 Notify RN    Comment 2 Document in Chart   Glucose, capillary     Status: Abnormal   Collection Time: 03/31/18  4:05 PM  Result Value Ref Range   Glucose-Capillary 185 (H) 70 - 99 mg/dL   Comment 1 Notify RN    Comment 2 Document in Chart   Glucose, capillary     Status: Abnormal    Collection Time: 03/31/18  8:07 PM  Result Value Ref Range   Glucose-Capillary 140 (H) 70 - 99 mg/dL  Glucose, capillary     Status: Abnormal   Collection Time: 03/31/18 11:37 PM  Result Value Ref Range   Glucose-Capillary 168 (H) 70 - 99 mg/dL  Glucose, capillary     Status: Abnormal   Collection Time: 04/01/18  3:26 AM  Result Value Ref Range   Glucose-Capillary 141 (H) 70 - 99 mg/dL  CBC with Differential/Platelet     Status: Abnormal   Collection Time: 04/01/18  6:56 AM  Result Value Ref Range   WBC 8.3 4.0 - 10.5 K/uL   RBC 3.48 (L) 3.87 - 5.11 MIL/uL   Hemoglobin 9.5 (L) 12.0 - 15.0 g/dL   HCT 31.8 (L) 36.0 - 46.0 %   MCV 91.4 80.0 - 100.0 fL   MCH 27.3 26.0 - 34.0 pg   MCHC 29.9 (L) 30.0 - 36.0 g/dL   RDW 15.0 11.5 - 15.5 %   Platelets 300 150 - 400 K/uL   nRBC 0.0 0.0 - 0.2 %   Neutrophils Relative % 55 %   Neutro Abs 4.6 1.7 - 7.7 K/uL   Lymphocytes Relative 32 %   Lymphs Abs 2.6 0.7 - 4.0 K/uL   Monocytes Relative 9 %   Monocytes Absolute 0.7 0.1 - 1.0 K/uL   Eosinophils Relative 3 %   Eosinophils Absolute 0.2 0.0 - 0.5 K/uL   Basophils Relative 1 %   Basophils Absolute 0.0 0.0 - 0.1 K/uL   Immature Granulocytes 0 %   Abs Immature Granulocytes 0.03 0.00 - 0.07 K/uL    Comment: Performed at [facility], [address] [place], [place] [address]  Glucose, capillary     Status: Abnormal   Collection Time: 04/01/18  8:00 AM  Result Value Ref Range   Glucose-Capillary 143 (H) 70 - 99 mg/dL   Comment 1 Notify RN    Comment 2 Document in Chart   Glucose, capillary     Status: Abnormal   Collection Time: 04/01/18 11:50 AM  Result Value Ref Range   Glucose-Capillary 222 (H) 70 - 99 mg/dL   Comment 1 Notify RN    Comment 2 Document in Chart   Glucose, capillary     Status: Abnormal   Collection Time: 04/01/18  4:28 PM  Result Value Ref Range   Glucose-Capillary 122 (H) 70 - 99 mg/dL   Comment 1 Notify RN    Comment 2 Document in Chart   Glucose,  capillary     Status: Abnormal   Collection Time: 04/01/18  9:38 PM  Result Value Ref Range   Glucose-Capillary 125 (H) 70 - 99 mg/dL   Comment 1 Notify RN    Comment 2 Document in Chart   Glucose, capillary     Status: Abnormal   Collection Time: 04/02/18  6:24 AM  Result Value Ref Range   Glucose-Capillary 193 (H)  70 - 99 mg/dL   Comment 1 Notify RN    Comment 2 Document in Chart    No results found.     Medical Problem List and Plan: 1.  Decreased functional ability secondary to seizures thought to be secondary to uncontrolled hypertension/PRES versus hypoglycemia  -admit to inpatient rehab 2.  DVT Prophylaxis/Anticoagulation: Subcutaneous heparin.  Monitor for any bleeding episodes 3. Pain Management: Oxycodone as needed  -limit to reduce cognitive effects 4. Mood: Provide emotional support 5. Neuropsych: This patient is capable of making decisions on her own behalf. 6. Skin/Wound Care: Routine skin checks, encourage appropriate nutrition  7. Fluids/Electrolytes/Nutrition: Routine in and outs with follow-up chemistries 8.  Seizure disorder.  Keppra 500 mg twice daily 9.  Hypertension.  Norvasc 10 mg daily, Coreg 25 mg twice daily, hydralazine 100 mg every 8 hours,Cardura 2 mg daily.  Monitor with increased mobility 10.  Diabetes mellitus with peripheral neuropathy.  Hemoglobin A1c 12.5.  SSI.   Patient on Tresiba 60 units daily prior to admission.  Resume as needed.   Diabetic teaching 11.  CKD stage III.  Follow-up chemistries, monitor urine output 12.  Asthma.  Continue nebulizers as directed. 13.  Hyperlipidemia.  Lipitor  Post Admission Physician Evaluation: 1. 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 2. Patient is admitted to receive collaborative, interdisciplinary care between the physiatrist, rehab nursing staff, and therapy team. 3. Patient's level of medical complexity and substantial therapy needs in context of that medical necessity cannot be provided at a lesser  intensity of care such as a SNF. 4. Patient has experienced substantial functional loss from his/her baseline which was documented above under the "Functional History" and "Functional Status" headings.  Judging by the patient's diagnosis, physical exam, and functional history, the patient has potential for functional progress which will result in measurable gains while on inpatient rehab.  These gains will be of substantial and practical use upon discharge  in facilitating mobility and self-care at the household level. 5. Physiatrist will provide 24 hour management of medical needs as well as oversight of the therapy plan/treatment and provide guidance as appropriate regarding the interaction of the two. 6. The Preadmission Screening has been reviewed and patient status is unchanged unless otherwise stated above. 7. 24 hour rehab nursing will assist with bladder management, bowel management, safety, skin/wound care, disease management, medication administration, pain management and patient education  and help integrate therapy concepts, techniques,education, etc. 8. PT will assess and treat for/with: Lower extremity strength, range of motion, stamina, balance, functional mobility, safety, adaptive techniques and equipment, NMR, cognitive perceptual rx, family ed.   Goals are: supervision. 9. OT will assess and treat for/with: ADL's, functional mobility, safety, upper extremity strength, adaptive techniques and equipment, NMR, cognitive perceptual rx, family ed.   Goals are: supervision. Therapy may proceed with showering this patient. 10. SLP will assess and treat for/with: cognition, communication.  Goals are: supervision to mod I. 11. Case Management and Social Worker will assess and treat for psychological issues and discharge planning. 12. Team conference will be held weekly to assess progress toward goals and to determine barriers to discharge. 13. Patient will receive at least 3 hours of therapy per  day at least 5 days per week. 14. ELOS: 6-9 days       15. Prognosis:  excellent   I have personally performed a face to face diagnostic evaluation of this patient and formulated the key components of the plan.  Additionally, I have personally reviewed laboratory  data, imaging studies, as well as relevant notes and concur with the physician assistant's documentation above.  Meredith Staggers, MD, FAAPMR    [facility], PA-C 04/02/2018

## 2018-04-02 NOTE — Progress Notes (Signed)
Physical Medicine and Rehabilitation Consult Reason for Consult: Decreased functional mobility Referring Physician: [facility]   HPI: Christina Orozco is a 47 y.o. 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  Per chart review and patient, patient lives with her boyfriend and 71 year old daughter.  Independent prior to admission.  Works doing data entry for a dog show.  One level home.  Boyfriend works during the day.  Presented 03/25/2018 with seizure requiring intubation for airway protection.  Noted systolic blood pressures in the 200s as well as elevated blood sugars.  Cranial CT scan reviewed, unremarkable for acute intracranial process. CT angiogram of head and neck with no dissection aneurysm or significant stenosis.  Urine drug screen negative.  MRI of the brain showed indistinct T2 hyperintensity in the pons that progressed from 07/25/2017 MRI.  The patient has had fluctuating pontine signals when compared to studies in 2018 and 2019 favoring atypical PRES or osmotic demyelination.  Latest EEG showing a mixture of theta and alpha rhythms seen from the central and temporal regions but did not record any clinical subclinical seizures.  Currently maintained on Keppra for seizure prophylaxis.  Plavix initiated for CVA prophylaxis.  Subcutaneous heparin for DVT prophylaxis.  Nasogastric tube in place for nutritional support.  Therapy evaluations completed with recommendations of physical medicine rehab consult.   Review of Systems  Constitutional: Negative for chills and fever.  HENT: Negative for hearing loss.   Eyes: Negative for blurred vision and double vision.  Respiratory: Positive for shortness of breath.   Cardiovascular: Negative for chest pain, palpitations and leg swelling.  Gastrointestinal: Positive for constipation. Negative for nausea and vomiting.       GERD  Genitourinary: Negative for flank pain and hematuria.    Skin: Negative for rash.  Neurological: Positive for dizziness and seizures. Negative for weakness.  All other systems reviewed and are negative.      Past Medical History:  Diagnosis Date  . Anemia    severe  . Asthma   . CKD (chronic kidney disease)    stage IV  . DDD (degenerative disc disease), cervical   . Diabetes mellitus ([place])   . Dislocated shoulder    right  . GERD (gastroesophageal reflux disease)   . Hypertension   . Neuropathy   . Stroke [facility])         Past Surgical History:  Procedure Laterality Date  . ADENOIDECTOMY    . APPENDECTOMY    . CERVICAL SPINE SURGERY  06/2012   C5-C6  . EYE SURGERY     left eye surgery   . FOOT SURGERY    . LOOP RECORDER INSERTION N/A 08/27/2017   Procedure: LOOP RECORDER INSERTION;  Surgeon: Sanda Klein, MD;  Location: [place] [facility];  Service: Cardiovascular;  Laterality: N/A;  . SHOULDER SURGERY    . TEE WITHOUT CARDIOVERSION N/A 08/27/2017   Procedure: TRANSESOPHAGEAL ECHOCARDIOGRAM (TEE);  Surgeon: Sanda Klein, MD;  Location[facility] ENDOSCOPY;  Service: Cardiovascular;  Laterality: N/A;  . TONSILLECTOMY          Family History  Problem Relation Age of Onset  . Vascular Disease Mother   . CAD Mother   . Heart failure Mother   . Heart disease Unknown   . Cancer Unknown        colon, stomach, lung  . Diabetes Unknown   . Seizures Unknown   . Breast cancer Sister 67   Social History:  reports that  she has never smoked. She has never used smokeless tobacco. She reports that she does not drink alcohol or use drugs. Allergies:       Allergies  Allergen Reactions  . Lisinopril Swelling and Other (See Comments)    Facial swelling   . Penicillins Hives    Has patient had a PCN reaction causing immediate rash, facial/tongue/throat swelling, SOB or lightheadedness with hypotension: yes Has patient had a PCN reaction causing severe rash involving mucus membranes or skin  necrosis: No Has patient had a PCN reaction that required hospitalization No Has patient had a PCN reaction occurring within the last 10 years: No If all of the above answers are "NO", then may proceed wit  . Gabapentin Swelling and Other (See Comments)    "I thought I was having another stroke."          Medications Prior to Admission  Medication Sig Dispense Refill  . amLODipine (NORVASC) 10 MG tablet Take 1 tablet (10 mg total) by mouth daily. 90 tablet 3  . clopidogrel (PLAVIX) 75 MG tablet Take 1 tablet (75 mg total) by mouth daily. 90 tablet 1  . doxazosin (CARDURA) 4 MG tablet Take 1 tablet (4 mg total) by mouth daily. 90 tablet 1  . furosemide (LASIX) 80 MG tablet Take 1 tablet (80 mg total) by mouth 2 (two) times daily. 180 tablet 1  . hydrALAZINE (APRESOLINE) 100 MG tablet Take 1 tablet (100 mg total) by mouth 3 (three) times daily. 90 tablet 5  . Insulin Degludec (TRESIBA FLEXTOUCH) 200 UNIT/ML SOPN Inject 60 Units into the skin daily. And pen needles 1/day 6 pen 11  . insulin glargine (LANTUS) 100 UNIT/ML injection Inject 70 Units into the skin every morning.    . meclizine (ANTIVERT) 25 MG tablet TAKE 1 TABLET(25 MG) BY MOUTH EVERY 8 HOURS AS NEEDED FOR DIZZINESS (Patient taking differently: Take 25 mg by mouth every 8 (eight) hours as needed for dizziness. ) 90 tablet 0  . atorvastatin (LIPITOR) 40 MG tablet TAKE 1 TABLET BY MOUTH EVERY EVENING AT 6PM (Patient not taking: No sig reported) 30 tablet 0  . carvedilol (COREG) 25 MG tablet Take 1 tablet (25 mg total) by mouth 2 (two) times daily with a meal. Take 25 mg twice daily (Patient not taking: Reported on 03/26/2018) 180 tablet 3  . ferrous sulfate 325 (65 FE) MG tablet Take 1 tablet (325 mg total) by mouth daily. (Patient not taking: Reported on 03/26/2018) 30 tablet 3  . glucose blood (CONTOUR NEXT TEST) test strip 1 each by Other route 2 (two) times daily. And lancets 2/day 100 each 12  . PROAIR HFA 108 (90 Base)  MCG/ACT inhaler INHALE 1 OR 2 PUFFS INTO THE LUNGS EVERY 6 HOURS AS NEEDED FOR WHEEZING OR SHORTNESS OF BREATH (Patient not taking: No sig reported) 8.5 g 0    Home: Home Living Family/patient expects to be discharged to:: Private residence Living Arrangements: Spouse/significant other Available Help at Discharge: Available PRN/intermittently Type of Home: Apartment Home Access: Level entry Home Layout: One level Bathroom Shower/Tub: Tub/shower unit, Architectural technologist: Standard Bathroom Accessibility: No Home Equipment: Grab bars - toilet, Grab bars - tub/shower  Lives With: Family  Functional History: Prior Function Level of Independence: Independent 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 Functional Status:  Mobility: Bed Mobility Overal bed mobility: Needs Assistance Bed Mobility: Supine to Sit Supine to sit: Min guard, HOB elevated General bed mobility comments: pt initiated however required  increased time due impaired sequencing and staying on task Transfers Overall transfer level: Needs assistance Equipment used: None Transfers: Sit to/from Stand Sit to Stand: Mod assist General transfer comment: upon standing pt swaying Left/right requiring modA to maintain balance, pt attempting to move feet to find balance however impaired coordination Ambulation/Gait Ambulation/Gait assistance: Mod assist, +2 safety/equipment Gait Distance (Feet): 150 Feet Assistive device: Rolling walker (2 wheeled) Gait Pattern/deviations: Step-through pattern, Decreased stride length, Staggering left, Staggering right General Gait Details: attempted to amb without AD as PTA however pt quickly had an episode of LOB requiring maxA to prevenet fall. pt with impaired trunk control, sequencing, motor planning and control. Pt reports "i feel off, wierd" pt reports sensation of pins and needles in hands and feet. modA for walker management Gait velocity: slow Gait velocity interpretation:  <1.8 ft/sec, indicate of risk for recurrent falls  ADL:  Cognition: Cognition Overall Cognitive Status: Impaired/Different from baseline Arousal/Alertness: Awake/alert Orientation Level: Oriented to person, Oriented to place, Oriented to situation, Disoriented to time Attention: Alternating Alternating Attention: Appears intact Memory: Impaired Memory Impairment: Storage deficit, Decreased short term memory Decreased Short Term Memory: Verbal basic Awareness: Impaired Awareness Impairment: Emergent impairment Executive Function: Self Monitoring Self Monitoring: Impaired Self Monitoring Impairment: Verbal basic, Functional basic Behaviors: Other (comment)(flat affect) Safety/Judgment: Impaired Cognition Arousal/Alertness: Awake/alert Behavior During Therapy: Flat affect Overall Cognitive Status: Impaired/Different from baseline Area of Impairment: Problem solving Problem Solving: Slow processing, Decreased initiation, Difficulty sequencing, Requires verbal cues, Requires tactile cues General Comments: pt with delayed response time, sequencing, and intiation  Blood pressure (!) 173/87, pulse 92, temperature 98.3 F (36.8 C), temperature source Oral, resp. rate 16, height 5\' 4"  (1.626 m), weight 95.1 kg, last menstrual period 03/12/2018, SpO2 95 %. Physical Exam  Vitals reviewed. Constitutional: She appears well-developed.  47 year old right-handed obese female  HENT:  Head: Normocephalic and atraumatic.  Nasogastric tube in place +NG  Eyes: EOM are normal. Right eye exhibits no discharge. Left eye exhibits no discharge.  Neck: Normal range of motion. Neck supple. No thyromegaly present.  Cardiovascular: Normal rate, regular rhythm and normal heart sounds.  Respiratory: Effort normal and breath sounds normal. No respiratory distress.  GI: Soft. Bowel sounds are normal. She exhibits no distension.  Musculoskeletal:  No edema or tenderness in extremities  Neurological: She  is alert.  Patient follows commands.   She cannot recall full events of her hospital stay. A&Ox2 Motor: RUE: 4/5 proximal to distal LUE: 4+/5 proximal to dista RLE: HF, KE 3/5, ADF 4+/5 LLE: HF, KE 3/5, ADF 4+/5  Skin: Skin is warm and dry.  Psychiatric: She has a normal mood and affect. Her behavior is normal.    LabResultsLast24Hours       Results for orders placed or performed during the hospital encounter of 03/25/18 (from the past 24 hour(s))  Glucose, capillary     Status: Abnormal   Collection Time: 03/31/18 12:44 PM  Result Value Ref Range   Glucose-Capillary 161 (H) 70 - 99 mg/dL   Comment 1 Notify RN    Comment 2 Document in Chart   Glucose, capillary     Status: Abnormal   Collection Time: 03/31/18  4:05 PM  Result Value Ref Range   Glucose-Capillary 185 (H) 70 - 99 mg/dL   Comment 1 Notify RN    Comment 2 Document in Chart   Glucose, capillary     Status: Abnormal   Collection Time: 03/31/18  8:07 PM  Result Value Ref Range  Glucose-Capillary 140 (H) 70 - 99 mg/dL  Glucose, capillary     Status: Abnormal   Collection Time: 03/31/18 11:37 PM  Result Value Ref Range   Glucose-Capillary 168 (H) 70 - 99 mg/dL  Glucose, capillary     Status: Abnormal   Collection Time: 04/01/18  3:26 AM  Result Value Ref Range   Glucose-Capillary 141 (H) 70 - 99 mg/dL  CBC with Differential/Platelet     Status: Abnormal   Collection Time: 04/01/18  6:56 AM  Result Value Ref Range   WBC 8.3 4.0 - 10.5 K/uL   RBC 3.48 (L) 3.87 - 5.11 MIL/uL   Hemoglobin 9.5 (L) 12.0 - 15.0 g/dL   HCT 31.8 (L) 36.0 - 46.0 %   MCV 91.4 80.0 - 100.0 fL   MCH 27.3 26.0 - 34.0 pg   MCHC 29.9 (L) 30.0 - 36.0 g/dL   RDW 15.0 11.5 - 15.5 %   Platelets 300 150 - 400 K/uL   nRBC 0.0 0.0 - 0.2 %   Neutrophils Relative % 55 %   Neutro Abs 4.6 1.7 - 7.7 K/uL   Lymphocytes Relative 32 %   Lymphs Abs 2.6 0.7 - 4.0 K/uL   Monocytes Relative 9 %   Monocytes  Absolute 0.7 0.1 - 1.0 K/uL   Eosinophils Relative 3 %   Eosinophils Absolute 0.2 0.0 - 0.5 K/uL   Basophils Relative 1 %   Basophils Absolute 0.0 0.0 - 0.1 K/uL   Immature Granulocytes 0 %   Abs Immature Granulocytes 0.03 0.00 - 0.07 K/uL  Glucose, capillary     Status: Abnormal   Collection Time: 04/01/18  8:00 AM  Result Value Ref Range   Glucose-Capillary 143 (H) 70 - 99 mg/dL   Comment 1 Notify RN    Comment 2 Document in Chart      ImagingResults(Last48hours)  No results found.    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   Labs and images (see above) independently reviewed.  Records reviewed and summated above.  1. Does the need for close, 24 hr/day medical supervision in concert with the patient's rehab needs make it unreasonable for this patient to be served in a less intensive setting? Potentially  2. Co-Morbidities requiring supervision/potential complications: seizure (cont meds), CKD stage III (avoid nephrotoxic meds), diabetes mellitus (Monitor in accordance with exercise and adjust meds as necessary), HTN (monitor and provide prns in accordance with increased physical exertion and pain, wean IV meds when appropriate), CVA with no residual weakness (cont meds), hypokalemia (continue to monitor and replete as necessary) 3. Due to safety, disease management and patient education, does the patient require 24 hr/day rehab nursing? Potentially 4. Does the patient require coordinated care of a physician, rehab nurse, PT (1-2 hrs/day, 5 days/week), OT (1-2 hrs/day, 5 days/week) and SLP (1-2 hrs/day, 5 days/week) to address physical and functional deficits in the context of the above medical diagnosis(es)? Potentially Addressing deficits in the following areas: balance, endurance, locomotion, strength, transferring, bathing, dressing, toileting, cognition and psychosocial support 5. Can the patient actively participate in an intensive  therapy program of at least 3 hrs of therapy per day at least 5 days per week? Yes 6. The potential for patient to make measurable gains while on inpatient rehab is excellent 7. Anticipated functional outcomes upon discharge from inpatient rehab are supervision  with PT, supervision with OT, modified independent with SLP. 8. Estimated rehab length of stay to reach the above functional goals is: 5-8 days. 9.  Anticipated D/C setting: Home 10. Anticipated post D/C treatments: HH therapy and Home excercise program 11. Overall Rehab/Functional Prognosis: excellent and good  RECOMMENDATIONS: This patient's condition is appropriate for continued rehabilitative care in the following setting: Potentially CIR. Would like to see progress in therapies to determine need for CIR. Patient has agreed to participate in recommended program. Potentially Note that insurance prior authorization may be required for reimbursement for recommended care.  Comment: Rehab Admissions Coordinator to follow up.   I have personally performed a face to face diagnostic evaluation, including, but not limited to relevant history and physical exam findings, of this patient and developed relevant assessment and plan.  Additionally, I have reviewed and concur with the physician assistant's documentation above.   Delice Lesch, MD, ABPMR Lavon Paganini Angiulli, PA-C 04/01/2018        Revision History                        Routing History

## 2018-04-02 NOTE — Progress Notes (Signed)
Physical Therapy Treatment Patient Details Name: Christina Orozco MRN: [id number] DOB: [birth year] Today's Date: 04/02/2018    History of Present Illness Pt is a 47 yo female admitted 10/14 with multiple seizures in setting of HTN emergency and hyperglycemia. PMH: CVA, HTN, DM, GERD, CKD 2, Asthma, Anemia, Neuropathy. Intubated 10/15-10/18.  EEG interpretation: EEG monitoring with simultaneous video monitoring did not record any clinical subclinical seizures. MRI revealed: Indistinct T2 hyperintensity in the pons that is progressed from 07/25/2017 brain MRI, chronic microhemorrhages in pons progressed from 2018, and remote lacunar infarct in R thalamus.     PT Comments    Patient seen for activity progression. Making progress towards PT goals with very basic activities but when attempting duel task or mobility without device, patient with multiple posterior LOB requiring increased assist to prevent fall. Patient continues to show some delays in verbal processing and remains flat affect. At this time, continue to feel patient would benefit from CIR to maximize recovery to baseline. Will continue to see and progress as tolerated.   Follow Up Recommendations  CIR     Equipment Recommendations  Rolling walker with 5" wheels    Recommendations for Other Services       Precautions / Restrictions Precautions Precautions: Fall Precaution Comments: seizure Restrictions Weight Bearing Restrictions: No    Mobility  Bed Mobility Overal bed mobility: Needs Assistance Bed Mobility: Supine to Sit     Supine to sit: Min guard;HOB elevated     General bed mobility comments: Min guard for safety  Transfers Overall transfer level: Needs assistance Equipment used: None Transfers: Sit to/from Stand Sit to Stand: Min guard;Min assist         General transfer comment: Min guard from bed, min assist for toilet with LOb posteriorly  Ambulation/Gait Ambulation/Gait assistance: Min  assist Gait Distance (Feet): 60 Feet Assistive device: Rolling walker (2 wheeled);1 person hand held assist Gait Pattern/deviations: Step-through pattern;Decreased stride length;Staggering left;Staggering right Gait velocity: slow Gait velocity interpretation: <1.31 ft/sec, indicative of household ambulator General Gait Details: min assist with RW noted drift and cues for enviroment negotiation. 3 epsiodes of posterior LOb when attempting without device   Stairs             Wheelchair Mobility    Modified Rankin (Stroke Patients Only) Modified Rankin (Stroke Patients Only) Pre-Morbid Rankin Score: No symptoms Modified Rankin: Moderate disability     Balance Overall balance assessment: Needs assistance Sitting-balance support: No upper extremity supported;Feet unsupported Sitting balance-Leahy Scale: Fair Sitting balance - Comments: min guard for forward reach to adjust socks for safety   Standing balance support: Bilateral upper extremity supported;During functional activity Standing balance-Leahy Scale: Fair Standing balance comment: dependent on RW, pt very unsteady               High Level Balance Comments: multiple losses of balance with mobility, especially turning and head turns             Cognition Arousal/Alertness: Awake/alert Behavior During Therapy: Flat affect;Impulsive Overall Cognitive Status: Impaired/Different from baseline Area of Impairment: Orientation;Attention;Memory;Following commands;Safety/judgement;Awareness;Problem solving                 Orientation Level: Disoriented to;Time(day/date) Current Attention Level: Sustained Memory: Decreased recall of precautions;Decreased short-term memory Following Commands: Follows one step commands inconsistently;Follows one step commands with increased time Safety/Judgement: Decreased awareness of safety;Decreased awareness of deficits Awareness: Emergent Problem Solving: Slow  processing;Decreased initiation;Difficulty sequencing;Requires verbal cues;Requires tactile cues General Comments: continues to show delayed response  and flat affect throughout session      Exercises      General Comments        Pertinent Vitals/Pain Pain Assessment: No/denies pain    Home Living                      Prior Function            PT Goals (current goals can now be found in the care plan section) Acute Rehab PT Goals Patient Stated Goal: home PT Goal Formulation: With patient Time For Goal Achievement: 04/15/18 Potential to Achieve Goals: Good Progress towards PT goals: Progressing toward goals    Frequency    Min 4X/week      PT Plan Current plan remains appropriate    Co-evaluation              AM-PAC PT "6 Clicks" Daily Activity  Outcome Measure  Difficulty turning over in bed (including adjusting bedclothes, sheets and blankets)?: Unable Difficulty moving from lying on back to sitting on the side of the bed? : Unable Difficulty sitting down on and standing up from a chair with arms (e.g., wheelchair, bedside commode, etc,.)?: Unable Help needed moving to and from a bed to chair (including a wheelchair)?: A Lot Help needed walking in hospital room?: A Lot Help needed climbing 3-5 steps with a railing? : A Lot 6 Click Score: 9    End of Session Equipment Utilized During Treatment: Gait belt Activity Tolerance: Patient tolerated treatment well Patient left: in chair;with call bell/phone within reach;with chair alarm set Nurse Communication: Mobility status PT Visit Diagnosis: Unsteadiness on feet (R26.81);Difficulty in walking, not elsewhere classified (R26.2)     Time: 8295-6213 PT Time Calculation (min) (ACUTE ONLY): 18 min  Charges:  $Gait Training: 8-22 mins                     Alben Deeds, PT DPT  Board Certified Neurologic Specialist [place] Pager [phone] Office  [phone]    Duncan Dull 04/02/2018, 12:22 PM

## 2018-04-02 NOTE — Discharge Instructions (Signed)
Epilepsy Epilepsy is when a person keeps having seizures. A seizure is unusual activity in the brain. A seizure can change how you think or behave, and it can make it hard to be aware of what is happening. This condition can cause problems, such as:  Falls, accidents, and injury.  Depression.  Poor memory.  Sudden unexplained death in epilepsy (SUDEP). This is rare. Its cause is not known.  Most people with epilepsy lead normal lives. Follow these instructions at home: Medicines   Take medicines only as told by your doctor.  Avoid anything that may keep your medicine from working, such as alcohol. Activity  Get enough rest. Lack of sleep can make seizures more likely to occur.  Follow your doctors advice about driving, swimming, and doing anything else that would be dangerous if you had a seizure. Teaching others Teach friends and family what to do if you have a seizure. They should:  Lay you on the ground to prevent a fall.  Cushion your head and body.  Loosen any tight clothing around your neck.  Turn you on your side.  Stay with you until you are better.  Not hold you down.  Not put anything in your mouth.  Know whether or not you need emergency care.  General instructions  Avoid anything that causes you to have seizures.  Keep a seizure diary. Write down what you remember about each seizure, and especially what might have caused it.  Keep all follow-up visits as told by your doctor. This is important. Contact a doctor if:  You have a change in your seizure pattern.  You get an infection or start to feel sick. You may have more seizures when you are sick. Get help right away if:  A seizure does not stop after 5 minutes.  You have more than one seizure in a row, and you do not have enough time between the seizures to feel better.  A seizure makes it harder to breathe.  A seizure is different from other seizures you have had.  A seizure makes you  unable to speak or use a part of your body.  You did not wake up right after a seizure. This information is not intended to replace advice given to you by your health care provider. Make sure you discuss any questions you have with your health care provider. Document Released: 03/26/2009 Document Revised: 01/03/2016 Document Reviewed: 12/07/2015 Elsevier Interactive Patient Education  2018 [employer].   How to Increase Your Level of Physical Activity Getting regular physical activity is important for your overall health and well-being. Most people do not get enough exercise. There are easy ways to increase your level of physical activity, even if you have not been very active in the past or you are just starting out. Why is physical activity important? Physical activity has many short-term and long-term health benefits. Regular exercise can:  Help you lose weight or maintain a healthy weight.  Strengthen your muscles and bones.  Boost your mood and improve self-esteem.  Reduce your risk of certain long-term (chronic) diseases, like heart disease, cancer, and diabetes.  Help you stay capable of walking and moving around (mobile) as you age.  Prevent accidents, such as falls, as you age.  Increase life expectancy.  What are the benefits of being physically active on a regular basis? In addition to improving your physical health, being physically active on most days of the week can help you in ways that you may  not expect. Benefits of regular physical activity may include:  Feeling good about your body.  Being able to move around more easily and for longer periods of time without getting tired (increased stamina).  Finding new sources of fun and enjoyment.  Meeting new people who share a common interest.  Being able to fight off illness better (enhanced immunity).  Being able to sleep better.  What can happen if I am not physically active on a regular basis? Not getting  enough physical activity can lead to an unhealthy lifestyle and future health problems. This can increase your chances of:  Becoming overweight or obese.  Becoming sick.  Developing chronic illnesses, like heart disease or diabetes.  Having mental health problems, like depression or anxiety.  Having sleep problems.  Having trouble walking or getting yourself around (reduced mobility).  Injuring yourself in a fall as you get older.  What steps can I take to be more physically active?  Check with your health care provider about how to get started. Ask your health care provider what activities are safe for you.  Start out slowly. Walking or doing some simple chair exercises is a good place to start, especially if you have not been active before or for a long time.  Try to find activities that you enjoy. You are more likely to commit to an exercise routine if it does not feel like a chore.  If you have bone or joint problems, choose low-impact exercises, like walking or swimming.  Include physical activity in your everyday routine.  Invite friends or family members to exercise with you. This also will help you commit to your workout plan.  Set goals that you can work toward.  Aim for at least 150 minutes of moderate-intensity exercise each week. Examples of moderate-intensity exercise include walking or riding a bike. Where to find more information:  Centers for Disease Control and Prevention: [employer]  [employer] on [place] www.http://villegas.org/  [employer]: [employer] Contact a health care provider if:  You have headaches, muscle aches, or joint pain.  You feel dizzy or light-headed while exercising.  You faint.  You have chest pain while exercising. Summary  Exercise benefits your mind and body at any age, even if you are just starting out.  If you have a chronic  illness or have not been active for a while, check with your health care provider before increasing your physical activity.  Choose activities that are safe and enjoyable for you.Ask your health care provider what activities are safe for you.  Start slowly. Tell your health care provider if you have problems as you start to increase your activity level. This information is not intended to replace advice given to you by your health care provider. Make sure you discuss any questions you have with your health care provider. Document Released: 05/18/2016 Document Revised: 05/18/2016 Document Reviewed: 05/18/2016 Elsevier Interactive Patient Education  2018 [employer].   Seizure, Adult When you have a seizure:  Parts of your body may move.  How aware or awake (conscious) you are may change.  You may shake (convulse).  Some people have symptoms right before a seizure happens. These symptoms may include:  Fear.  Worry (anxiety).  Feeling like you are going to throw up (nausea).  Feeling like the room is spinning (vertigo).  Feeling like you saw or heard something before (deja vu).  Odd tastes or smells.  Changes in vision, such as seeing flashing lights or spots.  Seizures usually last from 30 seconds to 2 minutes. Usually, they are not harmful unless they last a long time. Follow these instructions at home: Medicines  Take over-the-counter and prescription medicines only as told by your doctor.  Avoid anything that may keep your medicine from working, such as alcohol. Activity  Do not do any activities that would be dangerous if you had another seizure, like driving or swimming. Wait until your doctor approves.  If you live in the [place] ask your local DMV (department of motor vehicles) when you can drive.  Rest. Teaching others  Teach friends and family what to do when you have a seizure. They should: ? Lay you on the ground. ? Protect your head and body. ? Loosen  any tight clothing around your neck. ? Turn you on your side. ? Stay with you until you are better. ? Not hold you down. ? Not put anything in your mouth. ? Know whether or not you need emergency care. General instructions  Contact your doctor each time you have a seizure.  Avoid anything that gives you seizures.  Keep a seizure diary. Write down: ? What you think caused each seizure. ? What you remember about each seizure.  Keep all follow-up visits as told by your doctor. This is important. Contact a doctor if:  You have another seizure.  You have seizures more often.  There is any change in what happens during your seizures.  You continue to have seizures with treatment.  You have symptoms of being sick or having an infection. Get help right away if:  You have a seizure: ? That lasts longer than 5 minutes. ? That is different than seizures you had before. ? That makes it harder to breathe. ? After you hurt your head.  After a seizure, you cannot speak or use a part of your body.  After a seizure, you are confused or have a bad headache.  You have two or more seizures in a row.  You are having seizures more often.  You do not wake up right after a seizure.  You get hurt during a seizure. In an emergency:  These symptoms may be an emergency. Do not wait to see if the symptoms will go away. Get medical help right away. Call your local emergency services (911 in the [place] Do not drive yourself to the hospital. This information is not intended to replace advice given to you by your health care provider. Make sure you discuss any questions you have with your health care provider. Document Released: 11/15/2007 Document Revised: 02/09/2016 Document Reviewed: 02/09/2016 Elsevier Interactive Patient Education  2017 [employer].

## 2018-04-02 NOTE — Plan of Care (Signed)
  Problem: Education: Goal: Knowledge of General Education information will improve Description: Including pain rating scale, medication(s)/side effects and non-pharmacologic comfort measures Outcome: Completed/Met   Problem: Health Behavior/Discharge Planning: Goal: Ability to manage health-related needs will improve Outcome: Completed/Met   Problem: Clinical Measurements: Goal: Ability to maintain clinical measurements within normal limits will improve Outcome: Completed/Met Goal: Will remain free from infection Outcome: Completed/Met Goal: Diagnostic test results will improve Outcome: Completed/Met Goal: Respiratory complications will improve Outcome: Completed/Met Goal: Cardiovascular complication will be avoided Outcome: Completed/Met   Problem: Activity: Goal: Risk for activity intolerance will decrease Outcome: Completed/Met   Problem: Nutrition: Goal: Adequate nutrition will be maintained Outcome: Completed/Met   Problem: Coping: Goal: Level of anxiety will decrease Outcome: Completed/Met   Problem: Elimination: Goal: Will not experience complications related to bowel motility Outcome: Completed/Met Goal: Will not experience complications related to urinary retention Outcome: Completed/Met   Problem: Pain Managment: Goal: General experience of comfort will improve Outcome: Completed/Met   Problem: Safety: Goal: Ability to remain free from injury will improve Outcome: Completed/Met   Problem: Skin Integrity: Goal: Risk for impaired skin integrity will decrease Outcome: Completed/Met   Problem: Education: Goal: Expressions of having a comfortable level of knowledge regarding the disease process will increase Outcome: Completed/Met   Problem: Coping: Goal: Ability to adjust to condition or change in health will improve Outcome: Completed/Met Goal: Ability to identify appropriate support needs will improve Outcome: Completed/Met   Problem: Health  Behavior/Discharge Planning: Goal: Compliance with prescribed medication regimen will improve Outcome: Completed/Met   Problem: Medication: Goal: Risk for medication side effects will decrease Outcome: Completed/Met   Problem: Clinical Measurements: Goal: Complications related to the disease process, condition or treatment will be avoided or minimized Outcome: Completed/Met Goal: Diagnostic test results will improve Outcome: Completed/Met   Problem: Safety: Goal: Verbalization of understanding the information provided will improve Outcome: Completed/Met   Problem: Self-Concept: Goal: Level of anxiety will decrease Outcome: Completed/Met Goal: Ability to verbalize feelings about condition will improve Outcome: Completed/Met   

## 2018-04-02 NOTE — Progress Notes (Signed)
Patient admitted about 1820 to [phone] with all belongings. Patient oriented to rehab unit and schedule. Patient educated on rehab process, safety plan and fall safety. All questions answered. Vitals stable. Respiratory called for PRN breathing treatment due to pt report of shortness of breath. Pulse ox reading at 98% and chest sounds clear. Report given to on coming RN. Call bell and phone left within reach. Bed alarm set.

## 2018-04-02 NOTE — IPOC Note (Signed)
Overall Plan of Care [facility] Patient Details Name: Christina Orozco MRN: [id number] DOB: 12/03/1970  Admitting Diagnosis: Seizures  Hospital Problems: Active Problems:   PRES (posterior reversible encephalopathy syndrome)   Seizure (HCC)   Acute on chronic anemia   Hypoalbuminemia due to protein-calorie malnutrition (HCC)   Essential hypertension   Poorly controlled type 2 diabetes mellitus with peripheral neuropathy ([place])   Morbidly obese ([place])  Functional Problem List: Nursing Bladder, Medication Management, Skin Integrity  PT Balance, Endurance, Perception, Safety  OT Balance, Motor, Cognition, Endurance, Safety  SLP    TR         Basic ADL's: OT Grooming, Bathing, Dressing, Toileting     Advanced  ADL's: OT Simple Meal Preparation     Transfers: PT Bed Mobility, Bed to Chair, Car, Furniture, Floor  OT Toilet, Tub/Shower     Locomotion: PT Ambulation, Stairs     Additional Impairments: OT None  SLP Social Cognition, Communication expression Problem Solving, Attention, Memory, Awareness  TR      Anticipated Outcomes Item Anticipated Outcome  Self Feeding independent  Swallowing      Basic self-care  mod independent   Toileting  mod independent   Bathroom Transfers mod independent   Bowel/Bladder  mod I   Transfers  Mod I  Locomotion  Mod I with LRAD  Communication  Supervision A  Cognition  Min- Supervision A  Pain  less than 3 out of 10  Safety/Judgment  mod I    Therapy Plan: PT Intensity: Minimum of 1-2 x/day ,45 to 90 minutes PT Frequency: 5 out of 7 days PT Duration Estimated Length of Stay: 10-12 days OT Intensity: Minimum of 1-2 x/day, 45 to 90 minutes OT Frequency: 5 out of 7 days OT Duration/Estimated Length of Stay: 10-14 days SLP Intensity: Minumum of 1-2 x/day, 30 to 90 minutes SLP Frequency: 3 to 5 out of 7 days SLP Duration/Estimated Length of Stay: 5-8 days     Team Interventions: Nursing Interventions Patient/Family  Education, Skin Care/Wound Management, Disease Management/Prevention, Bladder Management, Medication Management, Discharge Planning  PT interventions Ambulation/gait training, Balance/vestibular training, Cognitive remediation/compensation, Community reintegration, Discharge planning, Disease management/prevention, DME/adaptive equipment instruction, Functional mobility training, Neuromuscular re-education, Pain management, Patient/family education, Psychosocial support, Stair training, Therapeutic Activities, Therapeutic Exercise, UE/LE Strength taining/ROM, UE/LE Coordination activities, Visual/perceptual remediation/compensation  OT Interventions    SLP Interventions Cognitive remediation/compensation, Cueing hierarchy, Functional tasks  TR Interventions    SW/CM Interventions Discharge Planning, Psychosocial Support, Patient/Family Education   Barriers to Discharge MD  Medical stability  Nursing      PT Decreased caregiver support, Medical stability    OT Other (comments) pt most likely will only have intermittent supervision at time of discharge   SLP      SW       Team Discharge Planning: Destination: PT-Home ,OT- Home , SLP-Home Projected Follow-up: PT-Home health PT, OT-  Home health OT, Other (comment)(to be further assessed ), SLP-Outpatient SLP, 24 hour supervision/assistance, Home Health SLP Projected Equipment Needs: PT-To be determined, OT- To be determined, SLP-None recommended by SLP Equipment Details: PT-RW vs no AD, OT-  Patient/family involved in discharge planning: PT- Patient, Family member/caregiver,  OT-Patient, SLP-Patient  MD ELOS: 6-9 days. 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 Presented 03/25/2018 with seizure requiring intubation for airway protection. Noted systolic blood pressure in the 200s as well as elevated blood  sugars. Hernial  CT scan reviewed unremarkable for acute intracranial process. CT angiogram of head and neck with no dissection aneurysm or significant stenosis. Urine drug screen negative. MRI of the brain showed indistinct T2 hyperintensity in the pons that progressed from 07/25/2017 MRI. The patient has had fluctuating pontine signals when compared to studies in 2018 and 2019 favoring atypical PRESor osmotic demyelination. Latest EEG showing a mixture of theta and alpha rhythms seen from the central and temporal regions but did not record any clinical subclinical seizures. Currently maintained on Keppra for seizure prophylaxis. Plavix initiated for CVA prophylaxis. Subcutaneous heparin for DVT prophylaxis. Initially with nasogastric tube for nutritional support and diet has been advanced to regular. Patient with resulting functional deficits with mobility, endurance, self-care.  Will set goals for Mod I with PT/OT and supervision with SLP.  See Team Conference Notes for weekly updates to the plan of care

## 2018-04-02 NOTE — H&P (Signed)
Physical Medicine and Rehabilitation Admission H&P        Chief Complaint  Patient presents with  . 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  Per chart review and patient, patient lives with boyfriend and 69 year old daughter.  Independent prior to admission.  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  One level home.  Boyfriend works during the day.  Presented 03/25/2018 with seizure requiring intubation for airway protection.  Noted systolic blood pressure in the 200s as well as elevated blood sugars.  Hernial CT scan reviewed unremarkable for acute intracranial process.  CT angiogram of head and neck with no dissection aneurysm or significant stenosis.  Urine drug screen negative.  MRI of the brain showed indistinct T2 hyperintensity in the pons that progressed from 07/25/2017 MRI.  The patient has had fluctuating pontine signals when compared to studies in 2018 and 2019 favoring atypical PRES or osmotic demyelination.  Latest EEG showing a mixture of theta and alpha rhythms seen from the central and temporal regions but did not record any clinical subclinical seizures.  Currently maintained on Keppra for seizure prophylaxis.  Plavix initiated for CVA prophylaxis.  Subcutaneous heparin for DVT prophylaxis.  Initially with nasogastric tube for nutritional support and diet has been advanced to regular.  Therapy evaluations completed with recommendations of physical medicine rehab consult.  Patient was admitted for a comprehensive rehab program.   Review of Systems  Constitutional: Negative for chills and fever.  HENT: Negative for hearing loss.   Eyes: Negative for blurred vision and double vision.  Respiratory: Negative for cough and shortness of breath.   Cardiovascular: Positive for leg swelling. Negative for chest pain and palpitations.  Gastrointestinal:  Positive for constipation. Negative for nausea and vomiting.       GERD  Genitourinary: Negative for dysuria, flank pain and hematuria.  Musculoskeletal: Positive for myalgias.  Skin: Negative for rash.  Neurological: Positive for seizures and headaches.  Psychiatric/Behavioral: The patient has insomnia.   All other systems reviewed and are negative.   Past Medical History:  Diagnosis Date  . Anemia      severe  . Asthma    . CKD (chronic kidney disease)      stage IV  . DDD (degenerative disc disease), cervical    . Diabetes mellitus ([place])    . Dislocated shoulder      right  . GERD (gastroesophageal reflux disease)    . Hypertension    . Neuropathy    . Stroke [facility])           Past Surgical History:  Procedure Laterality Date  . ADENOIDECTOMY      . APPENDECTOMY      . CERVICAL SPINE SURGERY   06/2012    C5-C6  . EYE SURGERY        left eye surgery   . FOOT SURGERY      . LOOP RECORDER INSERTION N/A 08/27/2017    Procedure: LOOP RECORDER INSERTION;  Surgeon: Sanda Klein, MD;  Location: [facility];  Service: Cardiovascular;  Laterality: N/A;  . SHOULDER SURGERY      . TEE WITHOUT CARDIOVERSION N/A 08/27/2017    Procedure: TRANSESOPHAGEAL ECHOCARDIOGRAM (TEE);  Surgeon: Sanda Klein, MD;  Location: [place];  Service: Cardiovascular;  Laterality: N/A;  . TONSILLECTOMY  Family History  Problem Relation Age of Onset  . Vascular Disease Mother    . CAD Mother    . Heart failure Mother    . Heart disease Unknown    . Cancer Unknown          colon, stomach, lung  . Diabetes Unknown    . Seizures Unknown    . Breast cancer Sister 33    Social History:  reports that she has never smoked. She has never used smokeless tobacco. She reports that she does not drink alcohol or use drugs. Allergies:  Allergies  Allergen Reactions  . Lisinopril Swelling and Other (See Comments)      Facial swelling   . Penicillins Hives      Has patient had a  PCN reaction causing immediate rash, facial/tongue/throat swelling, SOB or lightheadedness with hypotension: yes Has patient had a PCN reaction causing severe rash involving mucus membranes or skin necrosis: No Has patient had a PCN reaction that required hospitalization No Has patient had a PCN reaction occurring within the last 10 years: No If all of the above answers are "NO", then may proceed wit  . Gabapentin Swelling and Other (See Comments)      "I thought I was having another stroke."           Medications Prior to Admission  Medication Sig Dispense Refill  . amLODipine (NORVASC) 10 MG tablet Take 1 tablet (10 mg total) by mouth daily. 90 tablet 3  . clopidogrel (PLAVIX) 75 MG tablet Take 1 tablet (75 mg total) by mouth daily. 90 tablet 1  . doxazosin (CARDURA) 4 MG tablet Take 1 tablet (4 mg total) by mouth daily. 90 tablet 1  . furosemide (LASIX) 80 MG tablet Take 1 tablet (80 mg total) by mouth 2 (two) times daily. 180 tablet 1  . hydrALAZINE (APRESOLINE) 100 MG tablet Take 1 tablet (100 mg total) by mouth 3 (three) times daily. 90 tablet 5  . Insulin Degludec (TRESIBA FLEXTOUCH) 200 UNIT/ML SOPN Inject 60 Units into the skin daily. And pen needles 1/day 6 pen 11  . insulin glargine (LANTUS) 100 UNIT/ML injection Inject 70 Units into the skin every morning.      . meclizine (ANTIVERT) 25 MG tablet TAKE 1 TABLET(25 MG) BY MOUTH EVERY 8 HOURS AS NEEDED FOR DIZZINESS (Patient taking differently: Take 25 mg by mouth every 8 (eight) hours as needed for dizziness. ) 90 tablet 0  . atorvastatin (LIPITOR) 40 MG tablet TAKE 1 TABLET BY MOUTH EVERY EVENING AT 6PM (Patient not taking: No sig reported) 30 tablet 0  . carvedilol (COREG) 25 MG tablet Take 1 tablet (25 mg total) by mouth 2 (two) times daily with a meal. Take 25 mg twice daily (Patient not taking: Reported on 03/26/2018) 180 tablet 3  . ferrous sulfate 325 (65 FE) MG tablet Take 1 tablet (325 mg total) by mouth daily. (Patient not  taking: Reported on 03/26/2018) 30 tablet 3  . glucose blood (CONTOUR NEXT TEST) test strip 1 each by Other route 2 (two) times daily. And lancets 2/day 100 each 12  . PROAIR HFA 108 (90 Base) MCG/ACT inhaler INHALE 1 OR 2 PUFFS INTO THE LUNGS EVERY 6 HOURS AS NEEDED FOR WHEEZING OR SHORTNESS OF BREATH (Patient not taking: No sig reported) 8.5 g 0      Drug Regimen Review Drug regimen was reviewed and remains appropriate with no significant issues identified   Home: Home Living Family/patient expects to be  discharged to:: Private residence Living Arrangements: Spouse/significant other Available Help at Discharge: Available PRN/intermittently Type of Home: Apartment Home Access: Level entry Home Layout: One level Bathroom Shower/Tub: Tub/shower unit, Architectural technologist: Standard Bathroom Accessibility: No Home Equipment: Grab bars - toilet, Grab bars - tub/shower  Lives With: Family   Functional History: Prior Function Level of Independence: Independent 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   Functional Status:  Mobility: Bed Mobility Overal bed mobility: Needs Assistance Bed Mobility: Supine to Sit Supine to sit: Min guard, HOB elevated General bed mobility comments: seated OOB in chair upon entry Transfers Overall transfer level: Needs assistance Equipment used: None Transfers: Sit to/from Stand Sit to Stand: Min assist, +2 safety/equipment General transfer comment: sit to stand with min assist for safety and balance, unsteady once standing and attempting mobility therefore used RW Ambulation/Gait Ambulation/Gait assistance: Mod assist, +2 safety/equipment Gait Distance (Feet): 150 Feet Assistive device: Rolling walker (2 wheeled) Gait Pattern/deviations: Step-through pattern, Decreased stride length, Staggering left, Staggering right General Gait Details: attempted to amb without AD as PTA however pt quickly had an episode of LOB requiring maxA to  prevenet fall. pt with impaired trunk control, sequencing, motor planning and control. Pt reports "i feel off, wierd" pt reports sensation of pins and needles in hands and feet. modA for walker management Gait velocity: slow Gait velocity interpretation: <1.8 ft/sec, indicate of risk for recurrent falls   ADL: ADL Overall ADL's : Needs assistance/impaired Grooming: Sitting, Minimal assistance Upper Body Bathing: Minimal assistance, Sitting Lower Body Bathing: Moderate assistance, Sit to/from stand Upper Body Dressing : Minimal assistance, Sitting Lower Body Dressing: Moderate assistance, Sit to/from stand Toilet Transfer: Minimal assistance, +2 for safety/equipment, Ambulation, RW(simulated to recliner ) Toilet Transfer Details (indicate cue type and reason): cueing for hand placement, safety and sequencing  Toileting- Clothing Manipulation and Hygiene: Moderate assistance, Sit to/from stand Functional mobility during ADLs: Minimal assistance, +2 for safety/equipment, Rolling walker General ADL Comments: Pt requires increased time for processing, decreased attention/memory, impaired dual cognitive task processing and pathfinding.    Cognition: Cognition Overall Cognitive Status: Impaired/Different from baseline Arousal/Alertness: Awake/alert Orientation Level: Oriented X4 Attention: Alternating Alternating Attention: Appears intact Memory: Impaired Memory Impairment: Storage deficit, Decreased short term memory Decreased Short Term Memory: Verbal basic Awareness: Impaired Awareness Impairment: Emergent impairment Executive Function: Self Monitoring Self Monitoring: Impaired Self Monitoring Impairment: Verbal basic, Functional basic Behaviors: Other (comment)(flat affect) Safety/Judgment: Impaired Cognition Arousal/Alertness: Awake/alert Behavior During Therapy: Flat affect, Impulsive Overall Cognitive Status: Impaired/Different from baseline Area of Impairment: Orientation,  Attention, Memory, Following commands, Safety/judgement, Awareness, Problem solving Orientation Level: Disoriented to, Time(day/date) Current Attention Level: Sustained Memory: Decreased recall of precautions, Decreased short-term memory Following Commands: Follows one step commands inconsistently, Follows one step commands with increased time Safety/Judgement: Decreased awareness of safety, Decreased awareness of deficits Awareness: Emergent Problem Solving: Slow processing, Decreased initiation, Difficulty sequencing, Requires verbal cues, Requires tactile cues General Comments: pt with delayed response time, impaired safety/awareness   Physical Exam: Blood pressure (!) 152/72, pulse 85, temperature 99.2 F (37.3 C), temperature source Oral, resp. rate 20, height 5\' 4"  (1.626 m), weight 95.1 kg, last menstrual period 03/12/2018, SpO2 97 %. Physical Exam  Constitutional: No distress.  HENT:  Head: Normocephalic.  Eyes: Pupils are equal, round, and reactive to light.  Neck: Normal range of motion.  Cardiovascular: Normal rate and regular rhythm.  Respiratory: Effort normal and breath sounds normal.  GI: Soft. She exhibits no distension. There is no  tenderness.  Musculoskeletal: She exhibits edema.  Neurological:  Patient is alert and makes good eye contact..Mood is a bit flat.provides her name ,age.Follows simple commands.She can not recall her full hospital course. Moves all 4 limbs. Senses pain in all 4's  Psychiatric:  flat      Lab Results Last 48 Hours  Results for orders placed or performed during the hospital encounter of 03/25/18 (from the past 48 hour(s))  Glucose, capillary     Status: Abnormal    Collection Time: 03/31/18 12:44 PM  Result Value Ref Range    Glucose-Capillary 161 (H) 70 - 99 mg/dL    Comment 1 Notify RN      Comment 2 Document in Chart    Glucose, capillary     Status: Abnormal    Collection Time: 03/31/18  4:05 PM  Result Value Ref Range     Glucose-Capillary 185 (H) 70 - 99 mg/dL    Comment 1 Notify RN      Comment 2 Document in Chart    Glucose, capillary     Status: Abnormal    Collection Time: 03/31/18  8:07 PM  Result Value Ref Range    Glucose-Capillary 140 (H) 70 - 99 mg/dL  Glucose, capillary     Status: Abnormal    Collection Time: 03/31/18 11:37 PM  Result Value Ref Range    Glucose-Capillary 168 (H) 70 - 99 mg/dL  Glucose, capillary     Status: Abnormal    Collection Time: 04/01/18  3:26 AM  Result Value Ref Range    Glucose-Capillary 141 (H) 70 - 99 mg/dL  CBC with Differential/Platelet     Status: Abnormal    Collection Time: 04/01/18  6:56 AM  Result Value Ref Range    WBC 8.3 4.0 - 10.5 K/uL    RBC 3.48 (L) 3.87 - 5.11 MIL/uL    Hemoglobin 9.5 (L) 12.0 - 15.0 g/dL    HCT 31.8 (L) 36.0 - 46.0 %    MCV 91.4 80.0 - 100.0 fL    MCH 27.3 26.0 - 34.0 pg    MCHC 29.9 (L) 30.0 - 36.0 g/dL    RDW 15.0 11.5 - 15.5 %    Platelets 300 150 - 400 K/uL    nRBC 0.0 0.0 - 0.2 %    Neutrophils Relative % 55 %    Neutro Abs 4.6 1.7 - 7.7 K/uL    Lymphocytes Relative 32 %    Lymphs Abs 2.6 0.7 - 4.0 K/uL    Monocytes Relative 9 %    Monocytes Absolute 0.7 0.1 - 1.0 K/uL    Eosinophils Relative 3 %    Eosinophils Absolute 0.2 0.0 - 0.5 K/uL    Basophils Relative 1 %    Basophils Absolute 0.0 0.0 - 0.1 K/uL    Immature Granulocytes 0 %    Abs Immature Granulocytes 0.03 0.00 - 0.07 K/uL      Comment: Performed at [facility], [address], [place], [place] [address]  Glucose, capillary     Status: Abnormal    Collection Time: 04/01/18  8:00 AM  Result Value Ref Range    Glucose-Capillary 143 (H) 70 - 99 mg/dL    Comment 1 Notify RN      Comment 2 Document in Chart    Glucose, capillary     Status: Abnormal    Collection Time: 04/01/18 11:50 AM  Result Value Ref Range    Glucose-Capillary 222 (H) 70 - 99  mg/dL    Comment 1 Notify RN      Comment 2 Document in Chart    Glucose, capillary     Status:  Abnormal    Collection Time: 04/01/18  4:28 PM  Result Value Ref Range    Glucose-Capillary 122 (H) 70 - 99 mg/dL    Comment 1 Notify RN      Comment 2 Document in Chart    Glucose, capillary     Status: Abnormal    Collection Time: 04/01/18  9:38 PM  Result Value Ref Range    Glucose-Capillary 125 (H) 70 - 99 mg/dL    Comment 1 Notify RN      Comment 2 Document in Chart    Glucose, capillary     Status: Abnormal    Collection Time: 04/02/18  6:24 AM  Result Value Ref Range    Glucose-Capillary 193 (H) 70 - 99 mg/dL    Comment 1 Notify RN      Comment 2 Document in Chart        Imaging Results (Last 48 hours)  No results found.           Medical Problem List and Plan: 1.  Decreased functional ability secondary to seizures thought to be secondary to uncontrolled hypertension/PRES versus hypoglycemia             -admit to inpatient rehab 2.  DVT Prophylaxis/Anticoagulation: Subcutaneous heparin.  Monitor for any bleeding episodes 3. Pain Management: Oxycodone as needed             -limit to reduce cognitive effects 4. Mood: Provide emotional support 5. Neuropsych: This patient is capable of making decisions on her own behalf. 6. Skin/Wound Care: Routine skin checks, encourage appropriate nutrition  7. Fluids/Electrolytes/Nutrition: Routine in and outs with follow-up chemistries 8.  Seizure disorder.  Keppra 500 mg twice daily 9.  Hypertension.  Norvasc 10 mg daily, Coreg 25 mg twice daily, hydralazine 100 mg every 8 hours,Cardura 2 mg daily.  Monitor with increased mobility 10.  Diabetes mellitus with peripheral neuropathy.  Hemoglobin A1c 12.5.  SSI.     Patient on Tresiba 60 units daily prior to admission.  Resume as needed.              Diabetic teaching 11.  CKD stage III.  Follow-up chemistries, monitor urine output 12.  Asthma.  Continue nebulizers as directed. 13.  Hyperlipidemia.  Lipitor   Post Admission Physician Evaluation: 1. 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 2. Patient is admitted to receive collaborative, interdisciplinary care between the physiatrist, rehab nursing staff, and therapy team. 3. Patient's level of medical complexity and substantial therapy needs in context of that medical necessity cannot be provided at a lesser intensity of care such as a SNF. 4. Patient has experienced substantial functional loss from his/her baseline which was documented above under the "Functional History" and "Functional Status" headings.  Judging by the patient's diagnosis, physical exam, and functional history, the patient has potential for functional progress which will result in measurable gains while on inpatient rehab.  These gains will be of substantial and practical use upon discharge  in facilitating mobility and self-care at the household level. 5. Physiatrist will provide 24 hour management of medical needs as well as oversight of the therapy plan/treatment and provide guidance as appropriate regarding the interaction of the two. 6. The Preadmission Screening has been reviewed and patient status is unchanged unless otherwise stated above. 7. 24 hour rehab nursing  will assist with bladder management, bowel management, safety, skin/wound care, disease management, medication administration, pain management and patient education  and help integrate therapy concepts, techniques,education, etc. 8. PT will assess and treat for/with: Lower extremity strength, range of motion, stamina, balance, functional mobility, safety, adaptive techniques and equipment, NMR, cognitive perceptual rx, family ed.   Goals are: supervision. 9. OT will assess and treat for/with: ADL's, functional mobility, safety, upper extremity strength, adaptive techniques and equipment, NMR, cognitive perceptual rx, family ed.   Goals are: supervision. Therapy may proceed with showering this patient. 10. SLP will assess and treat for/with: cognition, communication.  Goals are: supervision to mod  I. 11. Case Management and Social Worker will assess and treat for psychological issues and discharge planning. 12. Team conference will be held weekly to assess progress toward goals and to determine barriers to discharge. 13. Patient will receive at least 3 hours of therapy per day at least 5 days per week. 14. ELOS: 6-9 days       15. Prognosis:  excellent     I have personally performed a face to face diagnostic evaluation of this patient and formulated the key components of the plan.  Additionally, I have personally reviewed laboratory data, imaging studies, as well as relevant notes and concur with the physician assistant's documentation above.   Meredith Staggers, MD, Mell[facility] Drown     Lavon Paganini [place], PA-C 04/02/2018   The patient's status has not changed. The original post admission physician evaluation remains appropriate, and any changes from the pre-admission screening or documentation from the acute chart are noted above.  Meredith Staggers, MD 04/02/2018

## 2018-04-02 NOTE — Progress Notes (Signed)
Inpatient Rehabilitation-Admissions Coordinator   [facility] has received insurance approval and medical approval for admit to CIR today. AC has updated pt, RN, CM, and SW regarding plan.   Please call if questions.   Jhonnie Garner, OTR/L  Rehab Admissions Coordinator  [phone] 04/02/2018 3:55 PM

## 2018-04-02 NOTE — Progress Notes (Signed)
  Speech Language Pathology Treatment: Dysphagia;Cognitive-Linquistic  Patient Details Name: Christina Orozco MRN: [id number] DOB: 1971-06-06 Today's Date: 04/02/2018 Time: 2025-4270 SLP Time Calculation (min) (ACUTE ONLY): 20 min  Assessment / Plan / Recommendation Clinical Impression  Pt consumed thin liquids with delayed coughing that began several minutes after intake. Cough seemed strong and lasted only briefly. She denies any difficulty with meals, but also says that she has not had much of an appetite, and lunch tray looks minimally touched. SLP helped pt find items in her menu that she would want to order for lunch, providing Mod cues for mildly complex problem solving and selective attention. She needed Max cues to be mindful of her carb modified diet. She continues to feel like she is in a "fog." Pt would benefit from additional SLP f/u to ensure diet tolerance and facilitate cognitive recovery.    HPI HPI: Christina [name] an 47 y.o.femalepresented with new onset seizures and has remained encephalopathic. CSF examination suggestive of bacterial meningitis although Gram stain and cultures are negative. 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 PMH: CVA, hypertension, diabetes, GERD, CKD stage II, asthma, anemia, and neuropathy. Intubted 10/14-10/18 . CXR no focal lung disease.      SLP Plan  Continue with current plan of care       Recommendations  Diet recommendations: Regular;Thin liquid Liquids provided via: Cup;Straw Medication Administration: Whole meds with liquid Supervision: Intermittent supervision to cue for compensatory strategies Compensations: Slow rate;Small sips/bites Postural Changes and/or Swallow Maneuvers: Seated upright 90 degrees                Oral Care Recommendations: Oral care BID Follow up Recommendations: Inpatient Rehab SLP Visit Diagnosis: Cognitive communication deficit (R41.841);Dysphagia, unspecified  (R13.10) Plan: Continue with current plan of care       GO                Christina Orozco 04/02/2018, 3:28 PM  Christina Orozco, M.A. [place] Acute Environmental education officer [phone] Office [phone]

## 2018-04-02 NOTE — Progress Notes (Signed)
Inpatient Rehabilitation-Admissions Coordinator    Met with patient at the bedside as follow up from PM&R consult. Per PT this morning, pt still having episodes of LOB and still requiring physical assistance for ambulation. Shared booklets, expectations while in CIR, expected length of stay, and anticipated functional level at DC. Pt's boyfriend works mostly 3rd shift and per pt and her boyfriend, he can provide the recommended supervision at DC from [facility]. AC has initiated insurance authorization for potential admit.   Will follow up once decision has been made.   Please call if questions.   Jhonnie Garner, OTR/L  Rehab Admissions Coordinator  [phone] 04/02/2018 12:32 PM

## 2018-04-02 NOTE — Discharge Summary (Signed)
Discharge Summary  Christina Orozco YJE:[id number] DOB: 1971/03/01  PCP: Lanae Boast, FNP  Admit date: 03/25/2018 Discharge date: 04/02/2018  Time spent: 35 minutes  Recommendations for Outpatient Follow-up:  1. Continue physical therapy at inpatient rehab 2. Take your medications as prescribed 3. Fall precautions  NOT ALLOWED TO DRIVE OR OPERATE HEAVY MACHINERY FOR AT LEAST 6 MONTHS OR UNTIL OTHERWISE APPROVED BY YOUR NEUROLOGIST.  Discharge Diagnoses:  Active Hospital Problems   Diagnosis Date Noted  . CKD (chronic kidney disease), stage III ([place])   . Diabetes mellitus type 2 in obese ([place])   . Benign essential HTN   . Hypokalemia   . History of CVA with residual deficit   . Seizures ([place]) 03/25/2018    Resolved Hospital Problems  No resolved problems to display.    Discharge Condition: Stable  Diet recommendation: Resume previous diet  Vitals:   04/02/18 0940 04/02/18 1210  BP: (!) 152/72 (!) 151/76  Pulse: 85 83  Resp: 20 20  Temp: 99.2 F (37.3 C) 99.1 F (37.3 C)  SpO2: 97% 96%    History of present illness:  47yo with a hx of DM1 on insulin, CVA, hypertension, hyperlipidemia, stage II-III CKD, and chronic grade 1 diastolic dysfunction by TTE 07/26/2017 who was admitted on 03/25/2018 for postictal state and recurrent seizures of unclear etiology with intubation status post extubation on 03/29/2018 with persistent encephalopathy.  04/02/2018: Patient seen and examined at bedside.  No acute events overnight.  No new complaints.  On the day of discharge the patient was hemodynamically stable.  She will need to continue physical therapy at inpatient rehab and follow-up with her PCP and neurologist posthospitalization.  Hospital Course:  Active Problems:   Seizures ([place])   CKD (chronic kidney disease), stage III ([facility])   Diabetes mellitus type 2 in obese ([facility])   Benign essential HTN   Hypokalemia   History of CVA with residual deficit  New onset  seizure disorder thought to be secondary to uncontrolled hypertension/PRES versus hypoglycemia - Neurology-follow-up with your neurologist outpatient.  Acute metabolic encephalopathy, possibly hypertensive emergency bacterial meningitis ruled out - appears to be clinically improving - post-ictal +/- hypertensive encephalopathy v/s PRES  Recent respiratory failure requiring intubation Continue duo nebs every 6 hours and then as needed O2 supplement as needed to maintain O2 saturation 92% or greater Home O2 evaluation  History of CVA with no residual deficits Continue clopidogrel daily  DM2 CBG quite variable - has just recently resumed regular diet - monitor w/o change in tx today  Hypertension Blood pressure stable Continue amlodipine 10 mg daily, Cardura 4 mg daily, hydralazine 100 mg 3 times daily, and Coreg 25 mg twice daily  Hyperlipidemia Continue Lipitor  AKI on Stage II-III CKD baseline creatinine 1.6-2.3 - stable at baseline  Avoid nephrotoxic agents Avoid dehydration   Procedures:  Intubation  Extubation  Consultations:  Neurology  PCCM  Discharge Exam: BP (!) 151/76 (BP Location: Right Arm)   Pulse 83   Temp 99.1 F (37.3 C) (Oral)   Resp 20   Ht 5\' 4"  (1.626 m)   Wt 95.1 kg   LMP 03/12/2018   SpO2 96%   BMI 35.99 kg/m  . General: 47 y.o. year-old year-old female well developed well nourished in no acute distress.  Alert and oriented x3. . Cardiovascular: Regular rate and rhythm with no rubs or gallops.  No thyromegaly or JVD noted.   Marland Kitchen Respiratory: Clear to auscultation with no wheezes or rales. Good inspiratory effort. Marland Kitchen  Abdomen: Soft nontender nondistended with normal bowel sounds x4 quadrants. . Musculoskeletal: No lower extremity edema. 2/4 pulses in all 4 extremities. . Skin: No ulcerative lesions noted or rashes, . Psychiatry: Mood is appropriate for condition and setting  Discharge Instructions You were cared for by a hospitalist during  your hospital stay. If you have any questions about your discharge medications or the care you received while you were in the hospital after you are discharged, you can call the unit and asked to speak with the hospitalist on call if the hospitalist that took care of you is not available. Once you are discharged, your primary care physician will handle any further medical issues. Please note that NO REFILLS for any discharge medications will be authorized once you are discharged, as it is imperative that you return to your primary care physician (or establish a relationship with a primary care physician if you do not have one) for your aftercare needs so that they can reassess your need for medications and monitor your lab values.   Allergies as of 04/02/2018      Reactions   Lisinopril Swelling, Other (See Comments)   Facial swelling    Penicillins Hives   Has patient had a PCN reaction causing immediate rash, facial/tongue/throat swelling, SOB or lightheadedness with hypotension: yes Has patient had a PCN reaction causing severe rash involving mucus membranes or skin necrosis: No Has patient had a PCN reaction that required hospitalization No Has patient had a PCN reaction occurring within the last 10 years: No If all of the above answers are "NO", then may proceed wit   Gabapentin Swelling, Other (See Comments)   "I thought I was having another stroke."       Medication List    STOP taking these medications   furosemide 80 MG tablet Commonly known as:  LASIX   Insulin Degludec 200 UNIT/ML Sopn   insulin glargine 100 UNIT/ML injection Commonly known as:  LANTUS   meclizine 25 MG tablet Commonly known as:  ANTIVERT     TAKE these medications   amLODipine 10 MG tablet Commonly known as:  NORVASC Take 1 tablet (10 mg total) by mouth daily.   atorvastatin 40 MG tablet Commonly known as:  LIPITOR TAKE 1 TABLET BY MOUTH EVERY EVENING AT 6PM   carvedilol 25 MG tablet Commonly  known as:  COREG Take 1 tablet (25 mg total) by mouth 2 (two) times daily with a meal. Take 25 mg twice daily   clopidogrel 75 MG tablet Commonly known as:  PLAVIX Take 1 tablet (75 mg total) by mouth daily.   doxazosin 4 MG tablet Commonly known as:  CARDURA Take 0.5 tablets (2 mg total) by mouth daily. What changed:  how much to take   ferrous sulfate 325 (65 FE) MG tablet Take 1 tablet (325 mg total) by mouth daily.   glucose blood test strip 1 each by Other route 2 (two) times daily. And lancets 2/day   hydrALAZINE 100 MG tablet Commonly known as:  APRESOLINE Take 1 tablet (100 mg total) by mouth 3 (three) times daily.   insulin aspart 100 UNIT/ML injection Commonly known as:  novoLOG Inject 0-20 Units into the skin 3 (three) times daily with meals.   insulin aspart 100 UNIT/ML injection Commonly known as:  novoLOG Inject 0-5 Units into the skin at bedtime.   levETIRAcetam 500 MG tablet Commonly known as:  KEPPRA Take 1 tablet (500 mg total) by mouth 2 (two) times daily.  pantoprazole 40 MG tablet Commonly known as:  PROTONIX Take 1 tablet (40 mg total) by mouth daily.   PROAIR HFA 108 (90 Base) MCG/ACT inhaler Generic drug:  albuterol INHALE 1 OR 2 PUFFS INTO THE LUNGS EVERY 6 HOURS AS NEEDED FOR WHEEZING OR SHORTNESS OF BREATH      Allergies  Allergen Reactions  . Lisinopril Swelling and Other (See Comments)    Facial swelling   . Penicillins Hives    Has patient had a PCN reaction causing immediate rash, facial/tongue/throat swelling, SOB or lightheadedness with hypotension: yes Has patient had a PCN reaction causing severe rash involving mucus membranes or skin necrosis: No Has patient had a PCN reaction that required hospitalization No Has patient had a PCN reaction occurring within the last 10 years: No If all of the above answers are "NO", then may proceed wit  . Gabapentin Swelling and Other (See Comments)    "I thought I was having another stroke."     Follow-up Information    Lanae Boast, FNP. Call in 1 day(s).   Specialty:  Family Medicine Why:  Please call for post hospital appointment. Contact information: [place] [address] [phone]        Skeet Latch, MD .   Specialty:  Cardiology Contact information: [address] [place] [address] [phone]        [facility]. Call in 1 day(s).   Specialty:  Neurology Why:  Please call for a post hospital appointment. Contact information: [address] [place] [phone]           The results of significant diagnostics from this hospitalization (including imaging, microbiology, ancillary and laboratory) are listed below for reference.    Significant Diagnostic Studies: Ct Angio Head W Or Wo Contrast  Result Date: 03/25/2018 CLINICAL DATA:  47 y/o F; patient found down and unconscious. Confusion with possible seizure. EXAM: CT ANGIOGRAPHY HEAD AND NECK TECHNIQUE: Multidetector CT imaging of the head and neck was performed using the standard protocol during bolus administration of intravenous contrast. Multiplanar CT image reconstructions and MIPs were obtained to evaluate the vascular anatomy. Carotid stenosis measurements (when applicable) are obtained utilizing NASCET criteria, using the distal internal carotid diameter as the denominator. CONTRAST:  50 cc Isovue 370 COMPARISON:  03/25/2018 CT head and cervical spine. 11/01/2017 MRI of the head. 07/26/2017 MRA of the head. 05/02/2016 MRA of head and neck. FINDINGS: CTA NECK FINDINGS Aortic arch: Bovine variant branching. Imaged portion shows no evidence of aneurysm or dissection. No significant stenosis of the major arch vessel origins. Right carotid system: No evidence of dissection, stenosis (50% or greater) or occlusion. Left carotid system: No evidence of dissection, stenosis (50% or greater) or occlusion.  Vertebral arteries: Codominant. No evidence of dissection, stenosis (50% or greater) or occlusion. Skeleton: T1 vertebral body hemangioma. C5-6 anterior cervical discectomy and fusion without apparent hardware related complication. Straightening of cervical lordosis is likely positional. Mild cervical spondylosis. No high-grade bony canal stenosis. Other neck: Negative. Upper chest: Negative. Review of the MIP images confirms the above findings CTA HEAD FINDINGS Anterior circulation: No significant stenosis, proximal occlusion, aneurysm, or vascular malformation. Posterior circulation: No significant stenosis, proximal occlusion, aneurysm, or vascular malformation. Venous sinuses: As permitted by contrast timing, patent. Anatomic variants: None significant. Review of the MIP images confirms the above findings IMPRESSION: 1. Patent carotid and vertebral arteries. No dissection, aneurysm, or hemodynamically significant stenosis utilizing NASCET  criteria. 2. Patent anterior and posterior intracranial circulation. No large vessel occlusion, aneurysm, or significant stenosis. Electronically Signed   By: Kristine Garbe M.D.   On: 03/25/2018 22:25   Ct Head Wo Contrast  Result Date: 03/25/2018 CLINICAL DATA:  Un witnessed loss of consciousness. Found down. Midline cervical spine tenderness. EXAM: CT HEAD WITHOUT CONTRAST CT CERVICAL SPINE WITHOUT CONTRAST TECHNIQUE: Multidetector CT imaging of the head and cervical spine was performed following the standard protocol without intravenous contrast. Multiplanar CT image reconstructions of the cervical spine were also generated. COMPARISON:  11/01/2017 FINDINGS: CT HEAD FINDINGS Brain: No evidence of acute infarction, hemorrhage, hydrocephalus, extra-axial collection or mass lesion/mass effect. Vascular: No hyperdense vessel or unexpected calcification. Skull: Normal. Negative for fracture or focal lesion. Sinuses/Orbits: Minimal mucosal disease in the sphenoid  sinuses. Other: None. CT CERVICAL SPINE FINDINGS Alignment: Straightening of cervical spine. Skull base and vertebrae: Negative for an acute fracture or dislocation. Anterior plate and screw fixation at C5-C6 with interbody device. Striated pattern in the T1 vertebral body probably represents a vertebral body hemangioma. Negative for an acute fracture. Soft tissues and spinal canal: There is endotracheal tube present. Prominent soft tissue in the nasopharynx likely related to the endotracheal tube. No evidence for spinal canal hematoma. Disc levels: Surgical fusion at C5-C6. The other disc space levels are maintained. Upper chest: Negative Other: None IMPRESSION: 1. No acute intracranial abnormality. 2. No acute abnormality in the cervical spine. 3. Surgical fusion at N2-D7 without complicating features. 4. Extensive soft tissue swelling in the nasopharynx and probably associated with endotracheal tube. Electronically Signed   By: Markus Daft M.D.   On: 03/25/2018 21:51   Ct Angio Neck W Or Wo Contrast  Result Date: 03/25/2018 CLINICAL DATA:  47 y/o F; patient found down and unconscious. Confusion with possible seizure. EXAM: CT ANGIOGRAPHY HEAD AND NECK TECHNIQUE: Multidetector CT imaging of the head and neck was performed using the standard protocol during bolus administration of intravenous contrast. Multiplanar CT image reconstructions and MIPs were obtained to evaluate the vascular anatomy. Carotid stenosis measurements (when applicable) are obtained utilizing NASCET criteria, using the distal internal carotid diameter as the denominator. CONTRAST:  50 cc Isovue 370 COMPARISON:  03/25/2018 CT head and cervical spine. 11/01/2017 MRI of the head. 07/26/2017 MRA of the head. 05/02/2016 MRA of head and neck. FINDINGS: CTA NECK FINDINGS Aortic arch: Bovine variant branching. Imaged portion shows no evidence of aneurysm or dissection. No significant stenosis of the major arch vessel origins. Right carotid system:  No evidence of dissection, stenosis (50% or greater) or occlusion. Left carotid system: No evidence of dissection, stenosis (50% or greater) or occlusion. Vertebral arteries: Codominant. No evidence of dissection, stenosis (50% or greater) or occlusion. Skeleton: T1 vertebral body hemangioma. C5-6 anterior cervical discectomy and fusion without apparent hardware related complication. Straightening of cervical lordosis is likely positional. Mild cervical spondylosis. No high-grade bony canal stenosis. Other neck: Negative. Upper chest: Negative. Review of the MIP images confirms the above findings CTA HEAD FINDINGS Anterior circulation: No significant stenosis, proximal occlusion, aneurysm, or vascular malformation. Posterior circulation: No significant stenosis, proximal occlusion, aneurysm, or vascular malformation. Venous sinuses: As permitted by contrast timing, patent. Anatomic variants: None significant. Review of the MIP images confirms the above findings IMPRESSION: 1. Patent carotid and vertebral arteries. No dissection, aneurysm, or hemodynamically significant stenosis utilizing NASCET criteria. 2. Patent anterior and posterior intracranial circulation. No large vessel occlusion, aneurysm, or significant stenosis. Electronically Signed   By: Mia Creek  [place] M.D.   On: 03/25/2018 22:25   Ct Cervical Spine Wo Contrast  Result Date: 03/25/2018 CLINICAL DATA:  Un witnessed loss of consciousness. Found down. Midline cervical spine tenderness. EXAM: CT HEAD WITHOUT CONTRAST CT CERVICAL SPINE WITHOUT CONTRAST TECHNIQUE: Multidetector CT imaging of the head and cervical spine was performed following the standard protocol without intravenous contrast. Multiplanar CT image reconstructions of the cervical spine were also generated. COMPARISON:  11/01/2017 FINDINGS: CT HEAD FINDINGS Brain: No evidence of acute infarction, hemorrhage, hydrocephalus, extra-axial collection or mass lesion/mass effect.  Vascular: No hyperdense vessel or unexpected calcification. Skull: Normal. Negative for fracture or focal lesion. Sinuses/Orbits: Minimal mucosal disease in the sphenoid sinuses. Other: None. CT CERVICAL SPINE FINDINGS Alignment: Straightening of cervical spine. Skull base and vertebrae: Negative for an acute fracture or dislocation. Anterior plate and screw fixation at C5-C6 with interbody device. Striated pattern in the T1 vertebral body probably represents a vertebral body hemangioma. Negative for an acute fracture. Soft tissues and spinal canal: There is endotracheal tube present. Prominent soft tissue in the nasopharynx likely related to the endotracheal tube. No evidence for spinal canal hematoma. Disc levels: Surgical fusion at C5-C6. The other disc space levels are maintained. Upper chest: Negative Other: None IMPRESSION: 1. No acute intracranial abnormality. 2. No acute abnormality in the cervical spine. 3. Surgical fusion at K8-J6 without complicating features. 4. Extensive soft tissue swelling in the nasopharynx and probably associated with endotracheal tube. Electronically Signed   By: Markus Daft M.D.   On: 03/25/2018 21:51   Mr Brain Wo Contrast  Result Date: 03/26/2018 CLINICAL DATA:  Stroke, follow-up. Admitted with seizures and altered mental status with fever. EXAM: MRI HEAD WITHOUT CONTRAST TECHNIQUE: Multiplanar, multiecho pulse sequences of the brain and surrounding structures were obtained without intravenous contrast. COMPARISON:  Head CT and CTA from yesterday FINDINGS: Brain: Increased indistinct T2 hyperintensity within the pons, without mass effect. Appearance is similar to a 06/20/2016 brain MRI and improved from subsequent MRIs. Mild FLAIR hyperintensity about the lateral ventricles, especially the atria and occipital horns. Remote lacunar infarct in the right medial thalamus and remote micro hemorrhages in the deep gray nuclei. No acute infarct, hemorrhage, hydrocephalus, or  collection. No evidence Uhl colour intraventricular debris. Vascular: Flow artifact in the left transverse sigmoid dural venous sinus. Major flow voids are preserved, all sequences considered. Skull and upper cervical spine: Negative for marrow lesion Sinuses/Orbits: Intubation with nasopharyngeal fluid. Other: Right frontal scalp lipoma. IMPRESSION: 1. Indistinct T2 hyperintensity in the pons that is progressed from 07/25/2017 brain MRI. The patient has had fluctuating pontine signal when compared to studies in 2018 and 2019, favor atypical PRES or osmotic demyelination. 2. Chronic microhemorrhages in the pons and deep gray nuclei that have progressed from 2018. 3. Chronic small vessel ischemia in the periventricular white matter. Remote lacunar infarct in the right thalamus. Electronically Signed   By: Monte Fantasia M.D.   On: 03/26/2018 11:02   Dg Chest Port 1 View  Result Date: 03/30/2018 CLINICAL DATA:  Respiratory failure. EXAM: PORTABLE CHEST 1 VIEW COMPARISON:  03/28/2018 FINDINGS: Endotracheal tube and nasogastric tube have been removed. There is now a feeding tube that extends into the abdomen. Cardiac loop recorder is again noted in the chest. Lungs are clear without focal airspace disease or pulmonary edema. Heart size is within normal limits and stable. IMPRESSION: No focal lung disease. Interval placement of a feeding tube. Tip of the feeding tube is not visualized. Electronically Signed   By:  Markus Daft M.D.   On: 03/30/2018 09:01   Dg Chest Port 1 View  Result Date: 03/28/2018 CLINICAL DATA:  Respiratory failure. EXAM: PORTABLE CHEST 1 VIEW COMPARISON:  March 27, 2018 FINDINGS: The ET tube is in stable position. The NG tube terminates below today's film. The cardiomediastinal silhouette is stable. No pulmonary nodules, masses, or focal infiltrates. No pneumothorax. IMPRESSION: Stable support apparatus within visualized limits. No acute abnormalities noted. Electronically Signed   By:  Dorise Bullion III M.D   On: 03/28/2018 09:15   Dg Chest Port 1 View  Result Date: 03/27/2018 CLINICAL DATA:  Acute respiratory failure. EXAM: PORTABLE CHEST 1 VIEW COMPARISON:  03/26/2018 FINDINGS: Endotracheal tube terminates 3.5 cm above the carina. Enteric tube courses into the left upper abdomen with tip not imaged. A loop recorder remains in place. The cardiac silhouette remains mildly enlarged. Lung volumes are lower than on the prior study. Mild bibasilar opacities are new on the right and unchanged on the left. No sizable pleural effusion or pneumothorax is identified. IMPRESSION: Lower lung volumes with mild bibasilar opacities, likely atelectasis. Electronically Signed   By: Logan Bores M.D.   On: 03/27/2018 10:15   Dg Chest Port 1 View  Result Date: 03/26/2018 CLINICAL DATA:  Respiratory failure. EXAM: PORTABLE CHEST 1 VIEW COMPARISON:  03/25/2018 FINDINGS: Endotracheal tube terminates approximately 3.5 cm above the carina. Enteric tube has been advanced into the stomach with tip not imaged. The cardiac silhouette is mildly enlarged. Mild retrocardiac opacity in the left lower lobe is unchanged. The right lung remains clear. No sizable pleural effusion or pneumothorax is identified. IMPRESSION: Unchanged mild left lower lobe opacity, possibly atelectasis. Electronically Signed   By: Logan Bores M.D.   On: 03/26/2018 08:44   Dg Chest Portable 1 View  Result Date: 03/25/2018 CLINICAL DATA:  Intubated. EXAM: PORTABLE CHEST 1 VIEW COMPARISON:  11/15/2016 FINDINGS: Endotracheal tube tip is 2 cm above the carina. There is a tube overlying the hypopharynx region which could be a and unsuccessfully placed nasogastric or orogastric tube or could be external to the patient. Artifact overlies the chest. Heart and mediastinal shadows are normal. Right lung is clear. There may be mild volume loss in the left lower lobe. No acute bone finding. IMPRESSION: Endotracheal tube tip 2 cm above the carina.  Question some volume loss in the left lower lobe. Tube overlying the hypopharynx region could be external to the patient or could represent a nasogastric tube unsuccessfully placed. Electronically Signed   By: Nelson Chimes M.D.   On: 03/25/2018 21:24   Dg Abd Portable 1v  Result Date: 03/26/2018 CLINICAL DATA:  OG tube placement EXAM: PORTABLE ABDOMEN - 1 VIEW COMPARISON:  None. FINDINGS: Enteric tube has been placed. Tip is in the right upper quadrant consistent with location in the proximal duodenum. Moderately gas-filled stomach. Cardiac enlargement. A loop recorder is identified. Lung bases appear clear. IMPRESSION: Enteric tube tip is in the right upper quadrant consistent with location in the proximal duodenum. Electronically Signed   By: Lucienne Capers M.D.   On: 03/26/2018 01:31    Microbiology: Recent Results (from the past 240 hour(s))  MRSA PCR Screening     Status: None   Collection Time: 03/26/18 12:09 AM  Result Value Ref Range Status   MRSA by PCR NEGATIVE NEGATIVE Final    Comment:        The GeneXpert MRSA Assay (FDA approved for NASAL specimens only), is one component of a comprehensive  MRSA colonization surveillance program. It is not intended to diagnose MRSA infection nor to guide or monitor treatment for MRSA infections. Performed at [facility], [place] [address], [place], [place] [address]   CSF culture     Status: None   Collection Time: 03/26/18  7:33 AM  Result Value Ref Range Status   Specimen Description CSF  Final   Special Requests NONE  Final   Gram Stain   Final    CYTOSPIN SMEAR WBC PRESENT, PREDOMINANTLY PMN NO ORGANISMS SEEN    Culture   Final    NO GROWTH 3 DAYS Performed at [facility], [place] [address] [place], [place] [address]    Report Status 03/29/2018 FINAL  Final  Fungus Culture With Stain     Status: None (Preliminary result)   Collection Time: 03/26/18  7:33 AM  Result Value Ref Range Status   Fungus Stain Final  report  Final    Comment: (NOTE) Performed At: [facility] [place], [place] [id number] Rush Farmer MD [id number]    Fungus (Mycology) Culture PENDING  Incomplete   Fungal Source CSF  Final    Comment: Performed at [facility], [place] [address], [place], [place] [address]  Fungus Culture Result     Status: None   Collection Time: 03/26/18  7:33 AM  Result Value Ref Range Status   Result 1 Comment  Final    Comment: (NOTE) KOH/Calcofluor preparation:  no fungus observed. Performed At: [facility] [address] [place], [place] [id number] Rush Farmer MD [id number]   Culture, blood (Routine X 2) w Reflex to ID Panel     Status: None   Collection Time: 03/26/18 11:37 AM  Result Value Ref Range Status   Specimen Description BLOOD RIGHT HAND  Final   Special Requests   Final    BOTTLES DRAWN AEROBIC ONLY Blood Culture adequate volume   Culture   Final    NO GROWTH 5 DAYS Performed at [facility], [place] [address], [place], [place] [address]    Report Status 03/31/2018 FINAL  Final  Culture, blood (Routine X 2) w Reflex to ID Panel     Status: None   Collection Time: 03/26/18 11:40 AM  Result Value Ref Range Status   Specimen Description BLOOD RIGHT HAND  Final   Special Requests   Final    BOTTLES DRAWN AEROBIC ONLY Blood Culture adequate volume   Culture   Final    NO GROWTH 5 DAYS Performed at [facility], [place] [address] [place],  [address]    Report Status 03/31/2018 FINAL  Final     Labs: Basic Metabolic Panel: Recent Labs  Lab 03/26/18 1843 03/27/18 0414 03/27/18 1204 03/28/18 0322 03/28/18 1707 03/29/18 0324 03/30/18 0550  NA 136 137  --  135  --  137 144  K 3.3* 3.6  --  3.4*  --  3.7 3.4*  CL 106 107  --  108  --  112* 118*  CO2 19* 18*  --  16*  --  15* 20*  GLUCOSE 148* 119*  --  242*  --  255* 171*  BUN 34* 38*  --  47*  --  57* 55*  CREATININE 3.50* 3.92*  --  3.85*  --  3.25* 2.42*   CALCIUM 8.6* 8.1*  --  7.4*  --  8.0* 8.6*  MG  --  1.9 2.6* 2.6* 2.6*  --   --  PHOS  --   --  5.6* 5.5* 3.5  --   --    Liver Function Tests: No results for input(s): AST, ALT, ALKPHOS, BILITOT, PROT, ALBUMIN in the last 168 hours. No results for input(s): LIPASE, AMYLASE in the last 168 hours. No results for input(s): AMMONIA in the last 168 hours. CBC: Recent Labs  Lab 03/27/18 0414 03/29/18 0324 03/30/18 0550 03/31/18 0206 04/01/18 0656  WBC 16.2* 11.7* 8.3 8.5 8.3  NEUTROABS  --  9.3* 5.0 5.4 4.6  HGB 9.7* 9.3* 9.5* 9.2* 9.5*  HCT 30.9* 28.9* 30.0* 29.8* 31.8*  MCV 88.5 88.7 88.8 89.5 91.4  PLT 201 206 246 261 300   Cardiac Enzymes: No results for input(s): CKTOTAL, CKMB, CKMBINDEX, TROPONINI in the last 168 hours. BNP: BNP (last 3 results) No results for input(s): BNP in the last 8760 hours.  ProBNP (last 3 results) No results for input(s): PROBNP in the last 8760 hours.  CBG: Recent Labs  Lab 04/01/18 0800 04/01/18 1150 04/01/18 1628 04/01/18 2138 04/02/18 0624  GLUCAP 143* 222* 122* 125* 193*       Signed:  Kayleen Memos, MD [facility] 04/02/2018, 3:18 PM

## 2018-04-02 NOTE — PMR Pre-admission (Signed)
PMR Admission Coordinator Pre-Admission Assessment  Patient: Christina Orozco is an 47 y.o., female MRN: [id number] DOB: July 28, 1970 Height: _0  (162.6 cm) Weight: 95.1 kg              Insurance Information HMO:     PPO: Yes     PCP:      IPA:      80/20:      OTHER:  PRIMARY: BCBS of       Policy#: [id number]      Subscriber: Patient CM Name: Merrilyn Puma      Phone#: [phone]     Fax#: [phone] Auth provided by Ree Shay on 04/02/18 for admit to CIR on 04/02/18. Clinical updates due to [facility] on 04/16/18.  Pre-Cert#: [id number]      Employer:  Benefits:  Phone #: NA     Name: [email or web address] Eff. Date: 10/10/2017     Deduct: $2,000 (met $2,000)      Out of Pocket Max: $5,000 (met $5,000)      Life Max:  CIR: 70% coverage /30%      SNF: 70%/30%, 60 day limit Outpatient: 30/ST, 30/PT and OT comb     Co-Pay: $25/visit Home Health: 70%      Co-Pay: 30% DME: 70%     Co-Pay: 30% Providers:  SECONDARY:       Policy#:       Subscriber:  CM Name:       Phone#:      Fax#:  Pre-Cert#:       Employer:  Benefits:  Phone #:      Name:  Eff. 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  Per chart review and patient, patient lives with boyfriend and 47 year old daughter.  Independent prior to admission.  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  One level home.  Boyfriend works  during the day.  Presented 03/25/2018 with seizure requiring intubation for airway protection.  Noted systolic blood pressure in the 200s as well as elevated blood sugars.  Hernial CT scan reviewed unremarkable for acute intracranial process.  CT angiogram of head and neck with no dissection aneurysm or significant stenosis.  Urine drug screen negative.  MRI of the brain showed indistinct T2 hyperintensity in the pons that progressed from 07/25/2017 MRI.  The patient has had fluctuating pontine signals when compared to studies in 2018 and 2019 favoring atypical PRES or osmotic demyelination.  Latest EEG showing a mixture of theta and alpha rhythms seen from the central and temporal regions but did not record any clinical subclinical seizures.  Currently maintained on Keppra for seizure prophylaxis.  Plavix initiated for CVA prophylaxis.  Subcutaneous heparin  for DVT prophylaxis.  Initially with nasogastric tube for nutritional support and diet has been advanced to regular.  Therapy evaluations completed with recommendations of physical medicine rehab consult.  Patient is to be admitted for a comprehensive rehab program on 04/02/18.      Past Medical History  Past Medical History:  Diagnosis Date  . Anemia    severe  . Asthma   . CKD (chronic kidney disease)    stage IV  . DDD (degenerative disc disease), cervical   . Diabetes mellitus ([place])   . Dislocated shoulder    right  . GERD (gastroesophageal reflux disease)   . Hypertension   . Neuropathy   . Stroke [facility])     Family History  family history includes Breast cancer (age of onset: 22) in her sister; CAD in her mother; Cancer in her unknown relative; Diabetes in her unknown relative; Heart disease in her unknown relative; Heart failure in her mother; Seizures in her unknown relative; Vascular Disease in her mother.  Prior Rehab/Hospitalizations:  Has the patient had major surgery during 100 days prior to admission? No  Current  Medications   Current Facility-Administered Medications:  .  acetaminophen (TYLENOL) tablet 650 mg, 650 mg, Oral, Q6H PRN, Joette Catching T, MD .  albuterol (PROVENTIL) (2.5 MG/3ML) 0.083% nebulizer solution 2.5 mg, 2.5 mg, Nebulization, Q2H PRN, Sood, Vineet, MD .  amLODipine (NORVASC) tablet 10 mg, 10 mg, Oral, Daily, Cherene Altes, MD, 10 mg at 04/02/18 0944 .  atorvastatin (LIPITOR) tablet 40 mg, 40 mg, Oral, q1800, Cherene Altes, MD, 40 mg at 04/01/18 1750 .  bisacodyl (DULCOLAX) suppository 10 mg, 10 mg, Rectal, Daily PRN, Chesley Mires, MD .  carvedilol (COREG) tablet 25 mg, 25 mg, Oral, BID WC, Cherene Altes, MD, 25 mg at 04/02/18 0757 .  clopidogrel (PLAVIX) tablet 75 mg, 75 mg, Oral, Daily, Cherene Altes, MD, 75 mg at 04/02/18 0944 .  docusate (COLACE) 50 MG/5ML liquid 100 mg, 100 mg, Oral, BID PRN, Cherene Altes, MD .  doxazosin (CARDURA) tablet 2 mg, 2 mg, Oral, Daily, Hall, Carole N, DO, 2 mg at 04/02/18 1528 .  heparin injection 5,000 Units, 5,000 Units, Subcutaneous, Q8H, Erick Colace, NP, 5,000 Units at 04/02/18 1528 .  hydrALAZINE (APRESOLINE) injection 5-10 mg, 5-10 mg, Intravenous, Q4H PRN, Magdalen Spatz, NP, 5 mg at 04/02/18 0035 .  hydrALAZINE (APRESOLINE) tablet 100 mg, 100 mg, Oral, Q6H, Cherene Altes, MD, 100 mg at 04/02/18 1216 .  insulin aspart (novoLOG) injection 0-20 Units, 0-20 Units, Subcutaneous, TID WC, Cherene Altes, MD, 3 Units at 04/02/18 1209 .  insulin aspart (novoLOG) injection 0-5 Units, 0-5 Units, Subcutaneous, QHS, McClung, Kimberlee Nearing, MD .  levETIRAcetam (KEPPRA) tablet 500 mg, 500 mg, Oral, BID, Hall, Carole N, DO .  oxyCODONE (Oxy IR/ROXICODONE) immediate release tablet 5 mg, 5 mg, Oral, Q4H PRN, Kipp Brood, MD, 5 mg at 03/31/18 0718 .  [START ON 04/03/2018] pantoprazole (PROTONIX) EC tablet 40 mg, 40 mg, Oral, Daily, Hall, Carole N, DO .  zolpidem (AMBIEN) tablet 5 mg, 5 mg, Oral, QHS PRN, Purohit, Shrey C,  MD, 5 mg at 03/31/18 2101  Patients Current Diet:  Diet Order            Diet Carb Modified Fluid consistency: Thin; Room service appropriate? 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 None of the above  Prior Functional Level Prior Function Level of Independence: Independent 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  Self Care: Did the patient need help bathing, dressing, using the toilet or eating?  Independent  Indoor Mobility: Did the patient need assistance with walking from room to room (with or without device)? Independent  Stairs: Did the patient need assistance with internal or external stairs (with or without device)? Independent  Functional Cognition: Did the patient need help planning regular tasks such as shopping or remembering to take medications? Independent  Current Functional Level Cognition  Arousal/Alertness: Awake/alert Overall Cognitive Status: Impaired/Different from baseline Current Attention Level: Sustained Orientation Level: Oriented X4 Following Commands: Follows one step commands inconsistently, Follows one step commands with increased time Safety/Judgement: Decreased awareness of safety, Decreased awareness of deficits General Comments: continues to show delayed response and flat affect throughout session Attention: Alternating Alternating Attention: Appears intact Memory:  Impaired Memory Impairment: Storage deficit, Decreased short term memory Decreased Short Term Memory: Verbal basic Awareness: Impaired Awareness Impairment: Emergent impairment Executive Function: Self Monitoring Self Monitoring: Impaired Self Monitoring Impairment: Verbal basic, Functional basic Behaviors: Other (comment)(flat affect) Safety/Judgment: Impaired    Extremity Assessment (includes Sensation/Coordination)  Upper Extremity Assessment: LUE deficits/detail LUE Deficits / Details: MMT 5/5 however impaired coordination LUE Sensation: decreased light touch(hand) LUE Coordination: decreased fine motor, decreased gross motor  Lower Extremity Assessment: Defer to PT evaluation    ADLs  Overall ADL's : Needs assistance/impaired Grooming: Sitting, Minimal assistance Upper Body Bathing: Minimal assistance, Sitting Lower Body Bathing: Moderate assistance, Sit to/from stand Upper Body Dressing : Minimal assistance, Sitting Lower Body Dressing: Moderate assistance, Sit to/from stand Toilet Transfer: Minimal assistance, +2 for safety/equipment, Ambulation, RW(simulated to recliner ) Toilet Transfer Details (indicate cue type and reason): cueing for hand placement, safety and sequencing  Toileting- Clothing Manipulation and Hygiene: Moderate assistance, Sit to/from stand Functional mobility during ADLs: Minimal assistance, +2 for safety/equipment, Rolling walker General ADL Comments: Pt requires increased time for processing, decreased attention/memory, impaired dual cognitive task processing and pathfinding.     Mobility  Overal bed mobility: Needs Assistance Bed Mobility: Supine to Sit Supine to sit: Min guard, HOB elevated General bed mobility comments: Min guard for safety    Transfers  Overall transfer level: Needs assistance Equipment used: None Transfers: Sit to/from Stand Sit to Stand: Min guard, Min assist General transfer comment: Min guard from bed, min assist for  toilet with LOb posteriorly    Ambulation / Gait / Stairs / Wheelchair Mobility  Ambulation/Gait Ambulation/Gait assistance: Herbalist (Feet): 60 Feet Assistive device: Rolling walker (2 wheeled), 1 person hand held assist Gait Pattern/deviations: Step-through pattern, Decreased stride length, Staggering left, Staggering right General Gait Details: min assist with RW noted drift and cues for enviroment negotiation. 3 epsiodes of posterior LOb when attempting without device Gait velocity: slow Gait velocity interpretation: <1.31 ft/sec, indicative of household ambulator    Posture / Balance Dynamic Sitting Balance Sitting balance - Comments: min guard for forward reach  to adjust socks for safety Balance Overall balance assessment: Needs assistance Sitting-balance support: No upper extremity supported, Feet unsupported Sitting balance-Leahy Scale: Fair Sitting balance - Comments: min guard for forward reach to adjust socks for safety Standing balance support: Bilateral upper extremity supported, During functional activity Standing balance-Leahy Scale: Fair Standing balance comment: dependent on RW, pt very unsteady High Level Balance Comments: multiple losses of balance with mobility, especially turning and head turns     Special needs/care consideration BiPAP/CPAP: not currently; but reports having scheduled sleep study on 05/06/18 PTA.  CPM: no Continuous Drip IV: no Dialysis: no       Days: no Life Vest: no Oxygen: currently RA Special Bed: seizure precautions Trach Size: no Wound Vac (area): no      Location: no Skin: skin tear, left distal arm                     Bowel mgmt:last BM: 03/30/18; reports continence  Bladder mgmt: continence  Diabetic mgmt: yes, pt reported an inability to afford shots.      Previous Home Environment Living Arrangements: Spouse/significant other  Lives With: Family Available Help at Discharge: Available PRN/intermittently Type  of Home: Apartment Home Layout: One level Home Access: Level entry Bathroom Shower/Tub: Tub/shower unit, Industrial/product designer: No Home Care Services: No  Discharge Living Setting Plans for Discharge Living Setting: Patient's home, Lives with (comment)(boyfriend and 53 yo daughter) Type of Home at Discharge: House Discharge Home Layout: One level Discharge Home Access: Level entry Discharge Bathroom Shower/Tub: Tub/shower unit Discharge Bathroom Toilet: Standard Discharge Bathroom Accessibility: Yes How Accessible: Accessible via walker Does the patient have any problems obtaining your medications?: Yes (Describe)(not able to afford diabetes medication; leading to admit)  Social/Family/Support Systems Patient Roles: Partner, Parent Contact Information: boyfriend is emergency contact: Joe [phone]) Anticipated Caregiver: boyfriend job  Anticipated Ambulance person Information: see above Ability/Limitations of Caregiver: Joe works 3rd shift (10:45pm-3:30am) with Joe present during the day Caregiver Availability: 24/7(between Joe and her brother (per Joe's report)) Discharge Plan Discussed with Primary Caregiver: Yes Is Caregiver In Agreement with Plan?: Yes Does Caregiver/Family have Issues with Lodging/Transportation while Pt is in Rehab?: No   Goals/Additional Needs Patient/Family Goal for Rehab: PT/OT: supervision; SLP: Modified Independent Expected length of stay: 5-8 days Cultural Considerations: [employer] Dietary Needs: carb modified, thin liquids (medium calorie level: 1600-2000).  Equipment Needs: TBD Special Service Needs: seizure precautions Pt/Family Agrees to Admission and willing to participate: Yes Program Orientation Provided & Reviewed with Pt/Caregiver Including Roles  & Responsibilities: Yes(reviewed with pt and Joe)  Barriers to Discharge: Insurance for SNF coverage   Decrease burden of Care through IP rehab  admission: NA    Possible need for SNF placement upon discharge: Not anticipated; pt has boyfriend who can provide supervision recommended at DC from [place]. Pt has good prognosis for further progress.    Patient Condition: This patient's condition remains as documented in the consult dated 04/01/18, in which the Rehabilitation Physician determined and documented that the patient's condition is appropriate for intensive rehabilitative care in an inpatient rehabilitation facility pending still had need after continued therapies. These areas have been addressed. Pt still requiring Min A for household ambulation with note of multiple episodes of posterior LOB.  Will admit to inpatient rehab today.  Preadmission Screen Completed By:  Jhonnie Garner, 04/02/2018 4:20 PM ______________________________________________________________________   Discussed status with Dr. Naaman Plummer on 04/02/18 at 4:20PM and received telephone approval for admission  today.  Admission Coordinator:  Jhonnie Garner, time 4:20PM/Date 04/02/18.

## 2018-04-02 NOTE — Progress Notes (Signed)
PMR Admission Coordinator Pre-Admission Assessment  Patient: Christina Orozco is an 47 y.o., female MRN: [id number] DOB: 13-Apr-1971 Height: '5\' 4"'  (162.6 cm) Weight: 95.1 kg                                                                                                                                                  Insurance Information HMO:     PPO: Yes     PCP:      IPA:      80/20:      OTHER:  PRIMARY: [employer] of [place]      Policy#: [id number]      Subscriber: Patient CM Name: Merrilyn Puma      Phone#: [phone]     Fax#: [phone] Auth provided by Ree Shay on 04/02/18 for admit to CIR on 04/02/18. Clinical updates due to [facility] on 04/16/18.  Pre-Cert#: [id number]      Employer:  Benefits:  Phone #: NA     Name: [email or web address] Eff. Date: 10/10/2017     Deduct: $2,000 (met $2,000)      Out of Pocket Max: $5,000 (met $5,000)      Life Max:  CIR: 70% coverage /30%      SNF: 70%/30%, 60 day limit Outpatient: 30/ST, 30/PT and OT comb     Co-Pay: $25/visit Home Health: 70%      Co-Pay: 30% DME: 70%     Co-Pay: 30% Providers:  SECONDARY:       Policy#:       Subscriber:  CM Name:       Phone#:      Fax#:  Pre-Cert#:       Employer:  Benefits:  Phone #:      Name:  Eff. Date:      Deduct:       Out of Pocket Max:      Life Max: CIR:       SNF:  Outpatient:      Co-Pay:  Home Health:       Co-Pay:  DME:      Co-Pay:   Medicaid Application Date:      Case Manager:  Disability Application Date:       Case Worker:   Emergency Contact Information         Contact Information    Name Relation Home Work Mobile   Fabiano,[name] Brother [phone]       Current Medical History  Patient Admitting Diagnosis: atypicalPRESvs osmotic demyelination. History of Present Illness: Andrienne Orozco is a 47 year old right-handed female with history of CKD stage III, diabetes mellitus, hypertension, CVA with no residual weakness. Per chart review and patient, patient lives with  boyfriend and 62 year old daughter. Independent prior to admission. 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 One level home. Boyfriend works during the day. Presented 03/25/2018 with seizure requiring  intubation for airway protection. Noted systolic blood pressure in the 200s as well as elevated blood sugars. Hernial CT scan reviewed unremarkable for acute intracranial process. CT angiogram of head and neck with no dissection aneurysm or significant stenosis. Urine drug screen negative. MRI of the brain showed indistinct T2 hyperintensity in the pons that progressed from 07/25/2017 MRI. The patient has had fluctuating pontine signals when compared to studies in 2018 and 2019 favoring atypical PRESor osmotic demyelination. Latest EEG showing a mixture of theta and alpha rhythms seen from the central and temporal regions but did not record any clinical subclinical seizures. Currently maintained on Keppra for seizure prophylaxis. Plavix initiated for CVA prophylaxis. Subcutaneous heparin for DVT prophylaxis. Initially with nasogastric tube for nutritional support and diet has been advanced to regular. Therapy evaluations completed with recommendations of physical medicine rehab consult. Patient is to be admitted for a comprehensive rehab program on 04/02/18.  Past Medical History      Past Medical History:  Diagnosis Date  . Anemia    severe  . Asthma   . CKD (chronic kidney disease)    stage IV  . DDD (degenerative disc disease), cervical   . Diabetes mellitus ([place])   . Dislocated shoulder    right  . GERD (gastroesophageal reflux disease)   . Hypertension   . Neuropathy   . Stroke [facility])     Family History  family history includes Breast cancer (age of onset: 14) in her sister; CAD in her mother; Cancer in her unknown relative; Diabetes in her unknown relative; Heart disease in her unknown relative; Heart failure in her mother; Seizures in her unknown  relative; Vascular Disease in her mother.  Prior Rehab/Hospitalizations:  Has the patient had major surgery during 100 days prior to admission? No  Current Medications   Current Facility-Administered Medications:  .  acetaminophen (TYLENOL) tablet 650 mg, 650 mg, Oral, Q6H PRN, Joette Catching T, MD .  albuterol (PROVENTIL) (2.5 MG/3ML) 0.083% nebulizer solution 2.5 mg, 2.5 mg, Nebulization, Q2H PRN, Sood, Vineet, MD .  amLODipine (NORVASC) tablet 10 mg, 10 mg, Oral, Daily, Cherene Altes, MD, 10 mg at 04/02/18 0944 .  atorvastatin (LIPITOR) tablet 40 mg, 40 mg, Oral, q1800, Cherene Altes, MD, 40 mg at 04/01/18 1750 .  bisacodyl (DULCOLAX) suppository 10 mg, 10 mg, Rectal, Daily PRN, Chesley Mires, MD .  carvedilol (COREG) tablet 25 mg, 25 mg, Oral, BID WC, Cherene Altes, MD, 25 mg at 04/02/18 0757 .  clopidogrel (PLAVIX) tablet 75 mg, 75 mg, Oral, Daily, Cherene Altes, MD, 75 mg at 04/02/18 0944 .  docusate (COLACE) 50 MG/5ML liquid 100 mg, 100 mg, Oral, BID PRN, Cherene Altes, MD .  doxazosin (CARDURA) tablet 2 mg, 2 mg, Oral, Daily, Hall, Carole N, DO, 2 mg at 04/02/18 1528 .  heparin injection 5,000 Units, 5,000 Units, Subcutaneous, Q8H, Erick Colace, NP, 5,000 Units at 04/02/18 1528 .  hydrALAZINE (APRESOLINE) injection 5-10 mg, 5-10 mg, Intravenous, Q4H PRN, Magdalen Spatz, NP, 5 mg at 04/02/18 0035 .  hydrALAZINE (APRESOLINE) tablet 100 mg, 100 mg, Oral, Q6H, Cherene Altes, MD, 100 mg at 04/02/18 1216 .  insulin aspart (novoLOG) injection 0-20 Units, 0-20 Units, Subcutaneous, TID WC, Cherene Altes, MD, 3 Units at 04/02/18 1209 .  insulin aspart (novoLOG) injection 0-5 Units, 0-5 Units, Subcutaneous, QHS, McClung, Jeffrey T, MD .  levETIRAcetam (KEPPRA) tablet 500 mg, 500 mg, Oral, BID, Hall, Carole N, DO .  oxyCODONE (Oxy IR/ROXICODONE) immediate release tablet 5 mg, 5 mg, Oral, Q4H PRN, Kipp Brood, MD, 5 mg at 03/31/18 0718 .  [START ON  04/03/2018] pantoprazole (PROTONIX) EC tablet 40 mg, 40 mg, Oral, Daily, Hall, Carole N, DO .  zolpidem (AMBIEN) tablet 5 mg, 5 mg, Oral, QHS PRN, Purohit, Shrey C, MD, 5 mg at 03/31/18 2101  Patients Current Diet:     Diet Order                  Diet Carb Modified Fluid consistency: Thin; Room service appropriate? 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 None of the above  Prior Functional Level Prior Function Level of Independence: Independent 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  Self Care: Did the patient need help bathing, dressing, using the toilet or eating?  Independent  Indoor Mobility: Did the patient need assistance with walking from room to room (with or without device)? Independent  Stairs: Did the patient need assistance with internal or external stairs (with or without device)? Independent  Functional Cognition: Did the patient need help planning regular tasks such as shopping or remembering to take medications? Independent  Current Functional Level Cognition  Arousal/Alertness: Awake/alert Overall Cognitive Status: Impaired/Different from baseline Current Attention Level: Sustained Orientation Level: Oriented X4 Following Commands: Follows one step commands inconsistently, Follows one step commands with increased time Safety/Judgement: Decreased awareness of safety, Decreased  awareness of deficits General Comments: continues to show delayed response and flat affect throughout session Attention: Alternating Alternating Attention: Appears intact Memory: Impaired Memory Impairment: Storage deficit, Decreased short term memory Decreased Short Term Memory: Verbal basic Awareness: Impaired Awareness Impairment: Emergent impairment Executive Function: Self Monitoring Self Monitoring: Impaired Self Monitoring Impairment: Verbal basic, Functional basic Behaviors: Other (comment)(flat affect) Safety/Judgment: Impaired    Extremity Assessment (includes Sensation/Coordination)  Upper Extremity Assessment: LUE deficits/detail LUE Deficits / Details: MMT 5/5 however impaired coordination LUE Sensation: decreased light touch(hand) LUE Coordination: decreased fine motor, decreased gross motor  Lower Extremity Assessment: Defer to PT evaluation    ADLs  Overall ADL's : Needs assistance/impaired Grooming: Sitting, Minimal assistance Upper Body Bathing: Minimal assistance, Sitting Lower Body Bathing: Moderate assistance, Sit to/from stand Upper Body Dressing : Minimal assistance, Sitting Lower Body Dressing: Moderate assistance, Sit to/from stand Toilet Transfer: Minimal assistance, +2 for safety/equipment, Ambulation, RW(simulated to recliner ) Toilet Transfer Details (indicate cue type and reason): cueing for hand placement, safety and sequencing  Toileting- Clothing Manipulation and Hygiene: Moderate assistance, Sit to/from stand Functional mobility during ADLs: Minimal assistance, +2 for safety/equipment, Rolling walker General ADL Comments: Pt requires increased time for processing, decreased attention/memory, impaired dual cognitive task processing and pathfinding.     Mobility  Overal bed mobility: Needs Assistance Bed Mobility: Supine to Sit Supine to sit: Min guard, HOB elevated General bed mobility comments: Min guard for safety    Transfers   Overall transfer level: Needs assistance  Equipment used: None Transfers: Sit to/from [employer] to Stand: [employer], Hydrographic surveyor transfer comment: Min guard from bed, min assist for toilet with LOb posteriorly    Ambulation / Gait / Stairs / Wheelchair Mobility  Ambulation/Gait Ambulation/Gait assistance: Herbalist (Feet): 60 Feet Assistive device: Rolling walker (2 wheeled), 1 person hand held assist Gait Pattern/deviations: Step-through pattern, Decreased stride length, Staggering left, Staggering right General Gait Details: min assist with RW noted drift and cues for enviroment negotiation. 3 epsiodes of posterior LOb when attempting without device Gait velocity: slow Gait velocity interpretation: <1.31 ft/sec, indicative of household ambulator    Posture / Balance Dynamic Sitting Balance Sitting balance - Comments: min guard for forward reach to adjust socks for safety Balance Overall balance assessment: Needs assistance Sitting-balance support: No upper extremity supported, Feet unsupported Sitting balance-Leahy Scale: Fair Sitting balance - Comments: min guard for forward reach to adjust socks for safety Standing balance support: Bilateral upper extremity supported, During functional activity Standing balance-Leahy Scale: Fair Standing balance comment: dependent on RW, pt very unsteady High Level Balance Comments: multiple losses of balance with mobility, especially turning and head turns     Special needs/care consideration BiPAP/CPAP: not currently; but reports having scheduled sleep study on 05/06/18 PTA.  CPM: no Continuous Drip IV: no Dialysis: no       Days: no Life Vest: no Oxygen: currently RA Special Bed: seizure precautions Trach Size: no Wound Vac (area): no      Location: no Skin: skin tear, left distal arm                     Bowel mgmt:last BM: 03/30/18; reports continence  Bladder mgmt: continence  Diabetic mgmt: yes, pt reported  an inability to afford shots.      Previous Home Environment Living Arrangements: Spouse/significant other  Lives With: Family Available Help at Discharge: Available PRN/intermittently Type of Home: Apartment Home Layout: One level Home Access: Level entry Bathroom Shower/Tub: Tub/shower unit, Industrial/product designer: No Home Care Services: No  Discharge Living Setting Plans for Discharge Living Setting: Patient's home, Lives with (comment)(boyfriend and 55 yo daughter) Type of Home at Discharge: House Discharge Home Layout: One level Discharge Home Access: Level entry Discharge Bathroom Shower/Tub: Tub/shower unit Discharge Bathroom Toilet: Standard Discharge Bathroom Accessibility: Yes How Accessible: Accessible via walker Does the patient have any problems obtaining your medications?: Yes (Describe)(not able to afford diabetes medication; leading to admit)  Social/Family/Support Systems Patient Roles: Partner, Parent Contact Information: boyfriend is emergency contact: Joe [phone]) Anticipated Caregiver: boyfriend job  Anticipated Ambulance person Information: see above Ability/Limitations of Caregiver: Joe works 3rd shift (10:45pm-3:30am) with Joe present during the day Caregiver Availability: 24/7(between Joe and her brother (per Joe's report)) Discharge Plan Discussed with Primary Caregiver: Yes Is Caregiver In Agreement with Plan?: Yes Does Caregiver/Family have Issues with Lodging/Transportation while Pt is in Rehab?: No   Goals/Additional Needs Patient/Family Goal for Rehab: PT/OT: supervision; SLP: Modified Independent Expected length of stay: 5-8 days Cultural Considerations: [employer] Dietary Needs: carb modified, thin liquids (medium calorie level: 1600-2000).  Equipment Needs: TBD Special Service Needs: seizure precautions Pt/Family Agrees to Admission and willing to participate: Yes Program Orientation  Provided & Reviewed with Pt/Caregiver Including Roles  & Responsibilities: Yes(reviewed with pt and Joe)  Barriers to Discharge: Insurance for SNF coverage   Decrease burden of Care through IP rehab admission: NA    Possible need for SNF placement upon  discharge: Not anticipated; pt has boyfriend who can provide supervision recommended at DC from CIR. Pt has good prognosis for further progress.    Patient Condition: This patient's condition remains as documented in the consult dated 04/01/18, in which the Rehabilitation Physician determined and documented that the patient's condition is appropriate for intensive rehabilitative care in an inpatient rehabilitation facility pending still had need after continued therapies. These areas have been addressed. Pt still requiring Min A for household ambulation with note of multiple episodes of posterior LOB. Will admit to inpatient rehab today.  Preadmission Screen Completed By:  Jhonnie Garner, 04/02/2018 4:20 PM ______________________________________________________________________   Discussed status with Dr. Naaman Plummer on 04/02/18 at 4:20PM and received telephone approval for admission today.  Admission Coordinator:  Jhonnie Garner, time 4:20PM/Date 04/02/18.            Cosigned by: Meredith Staggers, MD at 04/02/2018 4:50 PM  Revision History

## 2018-04-03 ENCOUNTER — Inpatient Hospital Stay (HOSPITAL_COMMUNITY): Payer: BLUE CROSS/BLUE SHIELD

## 2018-04-03 ENCOUNTER — Inpatient Hospital Stay (HOSPITAL_COMMUNITY): Payer: BLUE CROSS/BLUE SHIELD | Admitting: Physical Therapy

## 2018-04-03 ENCOUNTER — Inpatient Hospital Stay (HOSPITAL_COMMUNITY): Payer: BLUE CROSS/BLUE SHIELD | Admitting: Occupational Therapy

## 2018-04-03 DIAGNOSIS — E1142 Type 2 diabetes mellitus with diabetic polyneuropathy: Secondary | ICD-10-CM

## 2018-04-03 DIAGNOSIS — E46 Unspecified protein-calorie malnutrition: Secondary | ICD-10-CM

## 2018-04-03 DIAGNOSIS — E119 Type 2 diabetes mellitus without complications: Secondary | ICD-10-CM

## 2018-04-03 DIAGNOSIS — R569 Unspecified convulsions: Secondary | ICD-10-CM

## 2018-04-03 DIAGNOSIS — D649 Anemia, unspecified: Secondary | ICD-10-CM

## 2018-04-03 DIAGNOSIS — I10 Essential (primary) hypertension: Secondary | ICD-10-CM

## 2018-04-03 DIAGNOSIS — E1159 Type 2 diabetes mellitus with other circulatory complications: Secondary | ICD-10-CM

## 2018-04-03 DIAGNOSIS — E1165 Type 2 diabetes mellitus with hyperglycemia: Secondary | ICD-10-CM

## 2018-04-03 DIAGNOSIS — E1122 Type 2 diabetes mellitus with diabetic chronic kidney disease: Secondary | ICD-10-CM

## 2018-04-03 DIAGNOSIS — E8809 Other disorders of plasma-protein metabolism, not elsewhere classified: Secondary | ICD-10-CM

## 2018-04-03 LAB — CBC WITH DIFFERENTIAL/PLATELET
ABS IMMATURE GRANULOCYTES: 0.02 10*3/uL (ref 0.00–0.07)
BASOS ABS: 0 10*3/uL (ref 0.0–0.1)
Basophils Relative: 1 %
Eosinophils Absolute: 0.2 10*3/uL (ref 0.0–0.5)
Eosinophils Relative: 3 %
HCT: 27.9 % — ABNORMAL LOW (ref 36.0–46.0)
Hemoglobin: 8.6 g/dL — ABNORMAL LOW (ref 12.0–15.0)
IMMATURE GRANULOCYTES: 0 %
LYMPHS ABS: 2.5 10*3/uL (ref 0.7–4.0)
LYMPHS PCT: 41 %
MCH: 27.5 pg (ref 26.0–34.0)
MCHC: 30.8 g/dL (ref 30.0–36.0)
MCV: 89.1 fL (ref 80.0–100.0)
Monocytes Absolute: 0.7 10*3/uL (ref 0.1–1.0)
Monocytes Relative: 11 %
NEUTROS ABS: 2.8 10*3/uL (ref 1.7–7.7)
NRBC: 0 % (ref 0.0–0.2)
Neutrophils Relative %: 44 %
Platelets: 298 10*3/uL (ref 150–400)
RBC: 3.13 MIL/uL — AB (ref 3.87–5.11)
RDW: 14.6 % (ref 11.5–15.5)
WBC: 6.2 10*3/uL (ref 4.0–10.5)

## 2018-04-03 LAB — COMPREHENSIVE METABOLIC PANEL
ALBUMIN: 2.3 g/dL — AB (ref 3.5–5.0)
ALK PHOS: 58 U/L (ref 38–126)
ALT: 30 U/L (ref 0–44)
ANION GAP: 9 (ref 5–15)
AST: 30 U/L (ref 15–41)
BUN: 34 mg/dL — ABNORMAL HIGH (ref 6–20)
CHLORIDE: 109 mmol/L (ref 98–111)
CO2: 19 mmol/L — ABNORMAL LOW (ref 22–32)
Calcium: 8.3 mg/dL — ABNORMAL LOW (ref 8.9–10.3)
Creatinine, Ser: 1.95 mg/dL — ABNORMAL HIGH (ref 0.44–1.00)
GFR calc non Af Amer: 30 mL/min — ABNORMAL LOW (ref >60)
GFR, EST AFRICAN AMERICAN: 35 mL/min — AB (ref >60)
Glucose, Bld: 172 mg/dL — ABNORMAL HIGH (ref 70–99)
Potassium: 3.6 mmol/L (ref 3.5–5.1)
SODIUM: 137 mmol/L (ref 135–145)
Total Bilirubin: 0.7 mg/dL (ref 0.3–1.2)
Total Protein: 5.8 g/dL — ABNORMAL LOW (ref 6.5–8.1)

## 2018-04-03 LAB — GLUCOSE, CAPILLARY
GLUCOSE-CAPILLARY: 152 mg/dL — AB (ref 70–99)
Glucose-Capillary: 267 mg/dL — ABNORMAL HIGH (ref 70–99)
Glucose-Capillary: 188 mg/dL — ABNORMAL HIGH (ref 70–99)

## 2018-04-03 MED ORDER — PRO-STAT SUGAR FREE PO LIQD
30.0000 mL | Freq: Two times a day (BID) | ORAL | Status: DC
  Administered 2018-04-03 – 2018-04-13 (×21): 30 mL via ORAL
  Filled 2018-04-03 (×22): qty 30

## 2018-04-03 NOTE — Plan of Care (Signed)
  Problem: RH SKIN INTEGRITY Goal: RH STG MAINTAIN SKIN INTEGRITY WITH ASSISTANCE Description STG Maintain Skin Integrity With mod I Assistance.  Outcome: Progressing  Skin remains intact

## 2018-04-03 NOTE — Patient Care Conference (Cosign Needed)
Inpatient RehabilitationTeam Conference and Plan of Care Update Date: 04/03/2018   Time: 2:45 PM    Patient Name: Christina Orozco      Medical Record Number: [id number]  Date of Birth: Oct 06, 1970 Sex: Female         Room/Bed: 4M08C/4M08C-01 Payor Info: Payor: [place] / Plan: BCBS OTHER / Product Type: *No Product type* /    Admitting Diagnosis: seizures  Admit Date/Time:  04/02/2018  6:21 PM Admission Comments: No comment available   Primary Diagnosis:  <principal problem not specified> Principal Problem: <principal problem not specified>  Patient Active Problem List   Diagnosis Date Noted  . Acute on chronic anemia   . Hypoalbuminemia due to protein-calorie malnutrition ([place])   . Essential hypertension   . Poorly controlled type 2 diabetes mellitus with peripheral neuropathy ([place])   . Morbidly obese ([place])   . Seizure ([place]) 04/02/2018  . PRES (posterior reversible encephalopathy syndrome)   . CKD (chronic kidney disease), stage III ([place])   . Diabetes mellitus type 2 in obese ([place])   . Benign essential HTN   . Hypokalemia   . History of CVA with residual deficit   . Seizures ([place]) 03/25/2018  . Diabetes ([place]) 09/28/2017  . Anemia of chronic disease 08/09/2017  . Hyperlipidemia 07/26/2017  . 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[place]) 07/25/2017  . Anxiety as acute reaction to exceptional stress 04/23/2017  . Insomnia 10/08/2016  . Bacterial vaginitis 10/08/2016  . Neuropathy 06/07/2016  . Gait instability   . Lesion of pons   . Slurred speech   . Dizziness 04/30/2016  . Hyperglycemia 04/30/2016  . Chronic kidney disease (CKD), stage III (moderate) ([place]) 04/30/2016  . Ataxia 04/30/2016  . Chronic right shoulder pain 04/03/2016  . 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 Rhinitis, allergic 08/25/2015  . Asthma, mild intermittent 07/14/2015  . Gastroesophageal reflux disease without esophagitis 06/08/2015  . Alopecia 06/08/2015  . Hypertensive cardiovascular disease  05/05/2015  . Vitamin D deficiency 05/05/2015    Expected Discharge Date: Expected Discharge Date: (5-8 days)  Team Members Present: Physician leading conference: Dr. Delice Lesch Social Worker Present: Lennart Pall, LCSW Nurse Present: Dorien Chihuahua, RN PT Present: Barrie Folk, PT OT Present: Clyda Greener, OT SLP Present: Windell Moulding, SLP PPS Coordinator present : Daiva Nakayama, RN, CRRN     Current Status/Progress Goal Weekly Team Focus  Medical   Decreased functional ability secondary to seizures thought to be secondary to uncontrolled hypertension/PRES versus hypoglycemia  Improve mobility, BP, DM,  See above   Bowel/Bladder   incont bladder; cont bowel per report; lbm 10/22  cont of b/b with mod assist  assess q shift and prn; bladder program; toilet q 2-3hr while awake   Swallow/Nutrition/ Hydration             ADL's   Eval Pending         Mobility   Eval pending          Communication   Min A conversation  Supervision   speech intelligibility strategies    Safety/Cognition/ Behavioral Observations  Min-Mod A  Min-Supervision A  mildly complex problem solving, error awareness/correction, sustained attention, recall strategies   Pain   no c/o pain  <3 out of 10  assess q shift and prn   Skin   fissure to gluteal crease with foam; skin tear to LUE foam; abrasions to RUE and LUE   free from infection/brrakdown  assess q shift and prn    Rehab  Goals Patient on target to meet rehab goals: Yes *See Care Plan and progress notes for long and short-term goals.     Barriers to Discharge  Current Status/Progress Possible Resolutions Date Resolved   Physician    Medical stability     See above  Therapies, optimize BP/DM meds, follow labs      Nursing                  PT                    OT Other (comments)  pt most likely will only have intermittent supervision at time of discharge              SLP                SW                Discharge Planning/Teaching  Needs:  Plan home with spouse and brother able to provide assistance if needed.  Teaching needs TBD.   Team Discussion:  Eval pending with therapies today.  Anticipate 5-8 days.  SW to follow up with team end of week to set d/c date.  Unclear medical diagnosis.  Revisions to Treatment Plan:  NA    Continued Need for Acute Rehabilitation Level of Care: The patient requires daily medical management by a physician with specialized training in physical medicine and rehabilitation for the following conditions: Daily direction of a multidisciplinary physical rehabilitation program to ensure safe treatment while eliciting the highest outcome that is of practical value to the patient.: Yes Daily medical management of patient stability for increased activity during participation in an intensive rehabilitation regime.: Yes Daily analysis of laboratory values and/or radiology reports with any subsequent need for medication adjustment of medical intervention for : Neurological problems;Blood pressure problems;Diabetes problems;Renal problems;Other   I attest that I was present, lead the team conference, and concur with the assessment and plan of the team.   Maurio Baize 04/03/2018, 3:43 PM

## 2018-04-03 NOTE — Progress Notes (Signed)
Physical Therapy Assessment and Plan  Patient Details  Name: Christina Orozco MRN: [id number] Date of Birth: 1971-04-27  PT Diagnosis: Abnormality of gait, Ataxia, Cognitive deficits, Difficulty walking, Impaired cognition and Muscle weakness Rehab Potential: Good ELOS: 10-12 days   Today's Date: 04/03/2018 PT Individual Time: 1330-1445 PT Individual Time Calculation (min): 75 min    Problem List:  Patient Active Problem List   Diagnosis Date Noted  . Acute on chronic anemia   . Hypoalbuminemia due to protein-calorie malnutrition ([place])   . Essential hypertension   . Poorly controlled type 2 diabetes mellitus with peripheral neuropathy ([place])   . Morbidly obese ([place])   . Seizure ([place]) 04/02/2018  . PRES (posterior reversible encephalopathy syndrome)   . CKD (chronic kidney disease), stage III ([place])   . Diabetes mellitus type 2 in obese ([place])   . Benign essential HTN   . Hypokalemia   . History of CVA with residual deficit   . Seizures ([place]) 03/25/2018  . Diabetes ([place]) 09/28/2017  . Anemia of chronic disease 08/09/2017  . Hyperlipidemia 07/26/2017  . 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[place]) 07/25/2017  . Anxiety as acute reaction to exceptional stress 04/23/2017  . Insomnia 10/08/2016  . Bacterial vaginitis 10/08/2016  . Neuropathy 06/07/2016  . Gait instability   . Lesion of pons   . Slurred speech   . Dizziness 04/30/2016  . Hyperglycemia 04/30/2016  . Chronic kidney disease (CKD), stage III (moderate) ([place]) 04/30/2016  . Ataxia 04/30/2016  . Chronic right shoulder pain 04/03/2016  . 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 Rhinitis, allergic 08/25/2015  . Asthma, mild intermittent 07/14/2015  . Gastroesophageal reflux disease without esophagitis 06/08/2015  . Alopecia 06/08/2015  . Hypertensive cardiovascular disease 05/05/2015  . Vitamin D deficiency 05/05/2015    Past Medical History:  Past Medical History:  Diagnosis Date  . Anemia    severe  . Asthma   . CKD  (chronic kidney disease)    stage IV  . DDD (degenerative disc disease), cervical   . Diabetes mellitus ([place])   . Dislocated shoulder    right  . GERD (gastroesophageal reflux disease)   . Hypertension   . Neuropathy   . Stroke [facility])    Past Surgical History:  Past Surgical History:  Procedure Laterality Date  . ADENOIDECTOMY    . APPENDECTOMY    . CERVICAL SPINE SURGERY  06/2012   C5-C6  . EYE SURGERY     left eye surgery   . FOOT SURGERY    . LOOP RECORDER INSERTION N/A 08/27/2017   Procedure: LOOP RECORDER INSERTION;  Surgeon: Sanda Klein, MD;  Location: [facility];  Service: Cardiovascular;  Laterality: N/A;  . SHOULDER SURGERY    . TEE WITHOUT CARDIOVERSION N/A 08/27/2017   Procedure: TRANSESOPHAGEAL ECHOCARDIOGRAM (TEE);  Surgeon: Sanda Klein, MD;  Location: [facility] ENDOSCOPY;  Service: Cardiovascular;  Laterality: N/A;  . TONSILLECTOMY      Assessment & Plan Clinical Impression:  Christina Orozco is a 47 year old right-handed female with history of CKD stage III, diabetes mellitus, hypertension, CVA with no residual weakness. Per chart review and patient, patient lives with boyfriend and 14 year old daughter. Independent prior to admission. 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 One level home. Boyfriend works during the day. Presented 03/25/2018 with seizure requiring intubation for airway protection. Noted systolic blood pressure in the 200s as well as elevated blood sugars. Hernial CT scan reviewed unremarkable for acute intracranial process. CT angiogram of head and neck  with no dissection aneurysm or significant stenosis. Urine drug screen negative. MRI of the brain showed indistinct T2 hyperintensity in the pons that progressed from 07/25/2017 MRI. The patient has had fluctuating pontine signals when compared to studies in 2018 and 2019 favoring atypical PRESor osmotic demyelination. Latest EEG showing a mixture of theta and alpha rhythms seen  from the central and temporal regions but did not record any clinical subclinical seizures. Currently maintained on Keppra for seizure prophylaxis. Plavix initiated for CVA prophylaxis. Subcutaneous heparin for DVT prophylaxis. Initially with nasogastric tube for nutritional support and diet has been advanced to regular. Therapy evaluations completed with recommendations of physical medicine rehab consult. Patient was admitted for a comprehensive rehab program. Patient transferred to CIR on 04/02/2018 .   Patient currently requires min with mobility secondary to muscle weakness, decreased coordination and decreased standing balance, decreased postural control and decreased balance strategies.  Prior to hospitalization, patient was independent  with mobility and lived with Family, Significant other in a [place] home.  Home access is  Level entry.  Patient will benefit from skilled PT intervention to maximize safe functional mobility, minimize fall risk and decrease caregiver burden for planned discharge home with intermittent assist.  Anticipate patient will benefit from follow up [facility] at discharge.  PT - End of Session Activity Tolerance: Tolerates 30+ min activity with multiple rests Endurance Deficit: Yes Endurance Deficit Description: reports feeling SOA with activity PT Assessment Rehab Potential (ACUTE/IP ONLY): Good PT Barriers to Discharge: Decreased caregiver support;Medical stability PT Patient demonstrates impairments in the following area(s): Balance;Endurance;Perception;Safety PT Transfers Functional Problem(s): Bed Mobility;Bed to Chair;Car;Furniture;Floor PT Locomotion Functional Problem(s): Ambulation;Stairs PT Plan PT Intensity: Minimum of 1-2 x/day ,45 to 90 minutes PT Frequency: 5 out of 7 days PT Duration Estimated Length of Stay: 10-12 days PT Treatment/Interventions: Ambulation/gait training;Balance/vestibular training;Cognitive remediation/compensation;Community  reintegration;Discharge planning;Disease management/prevention;DME/adaptive equipment instruction;Functional mobility training;Neuromuscular re-education;Pain management;Patient/family education;Psychosocial support;Stair training;Therapeutic Activities;Therapeutic Exercise;UE/LE Strength taining/ROM;UE/LE Coordination activities;Visual/perceptual remediation/compensation PT Transfers Anticipated Outcome(s): Mod I PT Locomotion Anticipated Outcome(s): Mod I with LRAD PT Recommendation Recommendations for Other Services: Neuropsych consult Follow Up Recommendations: Home health PT Patient destination: Home Equipment Recommended: To be determined Equipment Details: RW vs no AD  Skilled Therapeutic Intervention Evaluation completed (see details above and below) with education on PT POC and goals and individual treatment initiated with focus on functional transfers and gait assessment. Pt received seated in w/c in room, agreeable to PT eval. No complaints of pain. Pt's boyfriend Joe present for first half of therapy evaluation and able to confirm some background information. Pt performs transfers with CGA to min A, v/c for safety and transfer technique. Toilet transfer with min A for balance and some assistance with brief management. Ambulation x 90 ft with RW and min A for balance, v/c to attend to obstacles in R visual field. Car transfer with min A. Ascend/descend 8 3" stairs with 2 handrails and min A, step-to gait pattern. Berg Balance Test 21/56, high fall risk. Education with patient about fall risk and need to call for assist before getting up. Pt reports feeling like she is in a "fog" and that she is in a "dream" since hospital admission. Seated BP 143/71 and SpO2 99%. Pt appears SOA at rest and with activity and has occasional wheezing throughout therapy session. Stand pivot transfer w/c to bed with min A and no AD. Sit to supine Supervision. Pt exhibits impulsivity throughout therapy session and  needs v/c for safety. Pt left semi-reclined in bed with  needs in reach and bed alarm in place.  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 Vision/Perception  Vision - History Baseline Vision: Wears glasses for distance only Patient Visual Report: Other (comment)(reports different from baseline but unable to describe how)  Cognition Overall Cognitive Status: Impaired/Different from baseline Arousal/Alertness: Awake/alert Orientation Level: Oriented X4 Attention: Sustained Sustained Attention: Impaired Sustained Attention Impairment: Functional complex;Verbal complex Memory: Impaired Memory Impairment: Decreased short term memory;Storage deficit Decreased Short Term Memory: Verbal complex;Functional complex Awareness: Impaired Awareness Impairment: Emergent impairment Problem Solving: Impaired Problem Solving Impairment: Functional complex;Verbal complex;Functional basic Executive Function: Self Monitoring;Self Correcting Self Monitoring: Impaired Self Monitoring Impairment: Verbal complex;Functional complex;Functional basic Self Correcting: Impaired Self Correcting Impairment: Verbal complex;Functional complex;Functional basic Behaviors: Impulsive Safety/Judgment: Impaired Comments: decreased safety awareness  Sensation Sensation Light Touch: Appears Intact Proprioception: Appears Intact Coordination Gross Motor Movements are Fluid and Coordinated: No Fine  Motor Movements are Fluid and Coordinated: No Coordination and Movement Description: impaired by generalized weakness Heel Shin Test: intact B Motor  Motor Motor: Within Functional Limits Motor - Skilled Clinical Observations: generalized weakness   Mobility Bed Mobility Bed Mobility: Rolling Right;Rolling Left;Supine to Sit;Sit to Supine Rolling Right: Supervision/verbal cueing Rolling Left: Supervision/Verbal cueing Supine to Sit: Supervision/Verbal cueing Sit to Supine: Supervision/Verbal cueing Transfers Transfers: Sit to Stand;Stand Pivot Transfers Sit to Stand: Contact Guard/Touching assist Stand to Sit: Contact Guard/Touching assist Stand Pivot Transfers: Minimal Assistance - Patient > 75% Stand Pivot Transfer Details: Verbal cues for sequencing;Verbal cues for technique;Verbal cues for precautions/safety Transfer (Assistive device): Rolling walker Locomotion  Gait Gait Distance (Feet): 100 Feet Assistive device: Rolling walker Gait Gait Pattern: Within Functional Limits Gait velocity: decreased Stairs / Additional Locomotion Stairs: Yes Stairs Assistance: Minimal Assistance - Patient > 75% Stair Management Technique: Two rails;Step to pattern Number of Stairs: 8 Height of Stairs: 3  Trunk/Postural Assessment  Cervical Assessment Cervical Assessment: Exceptions to WFL(forward head) Thoracic Assessment Thoracic Assessment: Exceptions to WFL(rounded shoulders) Lumbar Assessment Lumbar Assessment: Exceptions to WFL(posterior pelvic tilt) Postural Control Postural Control: Deficits on evaluation Righting Reactions: delayed  Balance Balance Balance Assessed: Yes Standardized Balance Assessment Standardized Balance Assessment: Berg Balance Test Berg Balance Test Sit to Stand: Able to stand  independently using hands Standing Unsupported: Able to stand 2 minutes with supervision Sitting with Back Unsupported but Feet Supported on Floor or Stool: Able to sit safely  and securely 2 minutes Stand to Sit: Sits independently, has uncontrolled descent Transfers: Needs one person to assist Standing Unsupported with Eyes Closed: Able to stand 10 seconds with supervision Standing Ubsupported with Feet Together: Able to place feet together independently and stand for 1 minute with supervision From Standing, Reach Forward with Outstretched Arm: Reaches forward but needs supervision From Standing Position, Pick up Object from Floor: Unable to try/needs assist to keep balance From Standing Position, Turn to Look Behind Over each Shoulder: Needs assist to keep from losing balance and falling Turn 360 Degrees: Needs assistance while turning Standing Unsupported, Alternately Place Feet on Step/Stool: Able to complete >2 steps/needs minimal assist Standing Unsupported, One Foot in Front: Needs help to step but can hold 15  seconds Standing on One Leg: Unable to try or needs assist to prevent fall Total Score: 21 Static Sitting Balance Static Sitting - Balance Support: Feet supported;No upper extremity supported Static Sitting - Level of Assistance: 5: Stand by assistance Dynamic Sitting Balance Dynamic Sitting - Balance Support: No upper extremity supported;Feet unsupported;During functional activity Dynamic Sitting - Level of Assistance: 5: Stand by assistance Static Standing Balance Static Standing - Balance Support: No upper extremity supported;During functional activity Static Standing - Level of Assistance: 5: Stand by assistance Dynamic Standing Balance Dynamic Standing - Balance Support: No upper extremity supported;During functional activity Dynamic Standing - Level of Assistance: 4: Min assist Extremity Assessment   RLE Assessment RLE Assessment: Exceptions to [facility] Active Range of Motion (AROM) Comments: tight HS General Strength Comments: decreased, see below RLE Strength Right Hip Flexion: 3/5 Right Knee Flexion: 3/5 Right Knee Extension: 5/5 Right  Ankle Dorsiflexion: 4/5 LLE Assessment LLE Assessment: Exceptions to [facility] Active Range of Motion (AROM) Comments: tight HS General Strength Comments: decreased, see below LLE Strength Left Hip Flexion: 3/5 Left Knee Flexion: 3/5 Left Knee Extension: 5/5 Left Ankle Dorsiflexion: 4/5   Refer to Care Plan for Long Term Goals  Recommendations for other services: Neuropsych  Discharge Criteria: Patient will be discharged from PT if patient refuses treatment 3 consecutive times without medical reason, if treatment goals not met, if there is a change in medical status, if patient makes no progress towards goals or if patient is discharged from hospital.  The above assessment, treatment plan, treatment alternatives and goals were discussed and mutually agreed upon: by patient and by family  Excell Seltzer, PT, DPT  04/03/2018, 3:52 PM

## 2018-04-03 NOTE — Progress Notes (Signed)
Inpatient Rehabilitation  Patient information reviewed and entered into eRehab system by Myanna Ziesmer M. Dakin Madani, M.A., CCC/SLP, PPS Coordinator.  Information including medical coding, functional ability and quality indicators will be reviewed and updated through discharge.    

## 2018-04-03 NOTE — Evaluation (Signed)
Occupational Therapy Assessment and Plan  Patient Details  Name: Christina Orozco MRN: [id number] Date of Birth: 12/20/1970  OT Diagnosis: altered mental status and muscle weakness (generalized) Rehab Potential: Rehab Potential (ACUTE ONLY): Good ELOS: 10-14 days   Today's Date: 04/03/2018 OT Individual Time: 0800-0900 OT Individual Time Calculation (min): 60 min     Problem List:  Patient Active Problem List   Diagnosis Date Noted  . Acute on chronic anemia   . Hypoalbuminemia due to protein-calorie malnutrition ([place])   . Essential hypertension   . Poorly controlled type 2 diabetes mellitus with peripheral neuropathy ([place])   . Morbidly obese ([place])   . Seizure ([place]) 04/02/2018  . PRES (posterior reversible encephalopathy syndrome)   . CKD (chronic kidney disease), stage III ([place])   . Diabetes mellitus type 2 in obese ([place])   . Benign essential HTN   . Hypokalemia   . History of CVA with residual deficit   . Seizures ([place]) 03/25/2018  . Diabetes ([place]) 09/28/2017  . Anemia of chronic disease 08/09/2017  . Hyperlipidemia 07/26/2017  . 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[place]) 07/25/2017  . Anxiety as acute reaction to exceptional stress 04/23/2017  . Insomnia 10/08/2016  . Bacterial vaginitis 10/08/2016  . Neuropathy 06/07/2016  . Gait instability   . Lesion of pons   . Slurred speech   . Dizziness 04/30/2016  . Hyperglycemia 04/30/2016  . Chronic kidney disease (CKD), stage III (moderate) ([place]) 04/30/2016  . Ataxia 04/30/2016  . Chronic right shoulder pain 04/03/2016  . 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 Rhinitis, allergic 08/25/2015  . Asthma, mild intermittent 07/14/2015  . Gastroesophageal reflux disease without esophagitis 06/08/2015  . Alopecia 06/08/2015  . Hypertensive cardiovascular disease 05/05/2015  . Vitamin D deficiency 05/05/2015    Past Medical History:  Past Medical History:  Diagnosis Date  . Anemia    severe  . Asthma   . CKD (chronic kidney  disease)    stage IV  . DDD (degenerative disc disease), cervical   . Diabetes mellitus ([place])   . Dislocated shoulder    right  . GERD (gastroesophageal reflux disease)   . Hypertension   . Neuropathy   . Stroke [facility])    Past Surgical History:  Past Surgical History:  Procedure Laterality Date  . ADENOIDECTOMY    . APPENDECTOMY    . CERVICAL SPINE SURGERY  06/2012   C5-C6  . EYE SURGERY     left eye surgery   . FOOT SURGERY    . LOOP RECORDER INSERTION N/A 08/27/2017   Procedure: LOOP RECORDER INSERTION;  Surgeon: Sanda Klein, MD;  Location: [place];  Service: Cardiovascular;  Laterality: N/A;  . SHOULDER SURGERY    . TEE WITHOUT CARDIOVERSION N/A 08/27/2017   Procedure: TRANSESOPHAGEAL ECHOCARDIOGRAM (TEE);  Surgeon: Sanda Klein, MD;  Location: [facility] ENDOSCOPY;  Service: Cardiovascular;  Laterality: N/A;  . TONSILLECTOMY      Assessment & Plan Clinical Impression: Patient is a 47 y.o. year old female with history of CKD stage III, diabetes mellitus, hypertension, CVA with no residual weakness. Per chart review and patient, patient lives with boyfriend and 4 year old daughter. Independent prior to admission. 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 One level home. Boyfriend works during the day. Presented 03/25/2018 with seizure requiring intubation for airway protection. Noted systolic blood pressure in the 200s as well as elevated blood sugars. Hernial CT scan reviewed unremarkable for acute intracranial process. CT angiogram of head and neck with  no dissection aneurysm or significant stenosis. Urine drug screen negative. MRI of the brain showed indistinct T2 hyperintensity in the pons that progressed from 07/25/2017 MRI. The patient has had fluctuating pontine signals when compared to studies in 2018 and 2019 favoring atypical PRESor osmotic demyelination. Latest EEG showing a mixture of theta and alpha rhythms seen from the central and temporal  regions but did not record any clinical subclinical seizures. Currently maintained on Keppra for seizure prophylaxis. Plavix initiated for CVA prophylaxis. Subcutaneous heparin for DVT prophylaxis. Initially with nasogastric tube for nutritional support and diet has been advanced to regular. Therapy evaluations completed with recommendations of physical medicine rehab consult. Patient transferred to CIR on 04/02/2018 .    Patient currently requires min-modA with basic self-care skills secondary to muscle weakness, decreased attention to right, decreased awareness, decreased problem solving and decreased safety awareness and decreased standing balance, decreased postural control and decreased balance strategies.  Prior to hospitalization, patient could complete ADLs/iADLs with independent .  Patient will benefit from skilled intervention to decrease level of assist with basic self-care skills, increase independence with basic self-care skills and increase level of independence with iADL prior to discharge home with care partner.  Anticipate patient will require intermittent supervision and will continue to assess for follow up therapy needs.  OT - End of Session Activity Tolerance: Tolerates 10 - 20 min activity with multiple rests Endurance Deficit: Yes OT Assessment Rehab Potential (ACUTE ONLY): Good OT Barriers to Discharge: Other (comments) OT Barriers to Discharge Comments: pt most likely will only have intermittent supervision at time of discharge  OT Patient demonstrates impairments in the following area(s): Balance;Motor;Cognition;Endurance;Safety OT Basic ADL's Functional Problem(s): Grooming;Bathing;Dressing;Toileting OT Advanced ADL's Functional Problem(s): Simple Meal Preparation OT Transfers Functional Problem(s): Toilet;Tub/Shower OT Additional Impairment(s): None OT Plan OT Intensity: Minimum of 1-2 x/day, 45 to 90 minutes OT Frequency: 5 out of 7 days OT Duration/Estimated  Length of Stay: 10-12 days OT Self Feeding Anticipated Outcome(s): independent OT Basic Self-Care Anticipated Outcome(s): mod independent  OT Toileting Anticipated Outcome(s): mod independent OT Bathroom Transfers Anticipated Outcome(s): mod independent  OT Recommendation Recommendations for Other Services: Neuropsych consult Patient destination: Home Follow Up Recommendations: Home health OT;Other (comment)(to be further assessed ) Equipment Recommended: To be determined   Skilled Therapeutic Intervention OT eval completed including explanation of CIR purpose, OT role, and overall POC. OT treatment session completed with focus on ADL retraining, no c/o pain this session. Pt performs stand pivot transfers throughout session with minA; use of RW for transfer to w/c, grab bars for transfer w/c<>TTB in shower. Pt requires increased cues prior to sitting as noted pt with increased difficulty centering herself in front of w/c, sitting more towards L side of w/c. RN present to administer meds start of session whille therapist gathered supplies for shower and w/c cushion. Pt completed bathing at shower level, initiating taking rest breaks during task completion. Pt does demonstrate impulsivities during task requiring close supervision-CGA throughout, requires minA for standing balance as pt with LOB when attempting to wash buttocks. Transitioned back to w/c where pt donned hospital gown and socks with minguard assist. Pt donned briefs (pre-attached to simulate donning underwear), she is able to thread LEs into briefs but requires assist to advance over hips as pt has LOB with attempts at completing, requiring need to maintain UE support on RW for standing balance. Pt left seated in w/c end of session with hand off to SLP to begin treatment session.   OT Evaluation  Precautions/Restrictions  Precautions Precautions: Fall Precaution Comments: seizure Restrictions Weight Bearing Restrictions:  No General Chart Reviewed: Yes   Pain Pain Assessment Pain Scale: 0-10 Pain Score: 0-No pain Home Living/Prior Functioning Home Living Family/patient expects to be discharged to:: Private residence Living Arrangements: Spouse/significant other Available Help at Discharge: Available PRN/intermittently Type of Home: Apartment Home Access: Level entry Home Layout: One level Bathroom Shower/Tub: Tub/shower unit, Architectural technologist: Standard  Lives With: Family, Significant other(boyfriend and 60 y/o daughter) IADL History Homemaking Responsibilities: Yes Meal Prep Responsibility: Primary Laundry Responsibility: Primary Cleaning Responsibility: Primary Homemaking Comments: was performing iADLs independently; reports boyfriend can assist  Current License: Yes Mode of Transportation: Car Occupation: Full time employment Type of Occupation: works completing data entry for dog shows, includes computer work; 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 [employer], unsure if this is current or previous employment Leisure and Hobbies: "sleeping" Prior Function Level of Independence: Independent with basic ADLs, Independent with homemaking with ambulation, Independent with gait  Able to Take Stairs?: Yes Driving: Yes Vocation: Full time employment ADL ADL Upper Body Bathing: Minimal cueing Where Assessed-Upper Body Bathing: Retail buyer Bathing: Minimal assistance Where Assessed-Lower Body Bathing: Shower Upper Body Dressing: Contact guard Where Assessed-Upper Body Dressing: Wheelchair Lower Body Dressing: Minimal assistance, Other (Comment)(donning briefs only this session) Where Assessed-Lower Body Dressing: Teaching laboratory technician: Minimal assistance Social research officer, government Method: Radiographer, therapeutic: Radio broadcast assistant, Grab bars Vision Baseline Vision/History: Wears glasses Wears Glasses: Reading only Patient Visual Report: No change from  baseline Vision Assessment?: (not formally assessed this session, will continue to assess ) Perception  Perception: Impaired Inattention/Neglect: Does not attend to right visual field;Impaired-to be further tested in functional context Praxis Praxis: Intact Cognition Overall Cognitive Status: Impaired/Different from baseline Arousal/Alertness: Awake/alert Orientation Level: Person;Place;Situation Person: Oriented Place: Oriented Situation: Oriented Year: 2019 Month: October Day of Week: Incorrect Memory: Impaired Memory Impairment: Decreased short term memory;Storage deficit Decreased Short Term Memory: Verbal complex;Functional complex Immediate Memory Recall: Sock;Blue;Bed Memory Recall: Sock;Blue;Bed Memory Recall Sock: Without Cue Memory Recall Blue: Without Cue Memory Recall Bed: Without Cue Attention: Selective Selective Attention: Appears intact Awareness: Impaired Awareness Impairment: Emergent impairment Problem Solving: Impaired Problem Solving Impairment: Functional complex;Verbal complex;Functional basic Executive Function: Self Monitoring;Self Correcting Self Monitoring: Impaired Self Monitoring Impairment: Verbal complex;Functional complex;Functional basic Self Correcting: Impaired Self Correcting Impairment: Verbal complex;Functional complex;Functional basic Behaviors: Impulsive(flat affect) Safety/Judgment: Impaired Comments: decreased safety awareness  Sensation Sensation Light Touch: Appears Intact Hot/Cold: Appears Intact Coordination Gross Motor Movements are Fluid and Coordinated: No Fine Motor Movements are Fluid and Coordinated: No Coordination and Movement Description: decreased fluidity noted with LUE Finger Nose Finger Test: slightly increased effort with RUE; dysmetria noted with LUE requiring increased time/effort Motor  Motor Motor - Skilled Clinical Observations: generalized weakness  Mobility  Bed Mobility Bed Mobility: Supine to  Sit Supine to Sit: Contact Guard/Touching assist;Other (comment)(HOB elevated) Transfers Sit to Stand: Contact Guard/Touching assist Stand to Sit: Contact Guard/Touching assist;Minimal Assistance - Patient > 75%  Trunk/Postural Assessment  Cervical Assessment Cervical Assessment: Exceptions to WFL(forward head) Thoracic Assessment Thoracic Assessment: Exceptions to WFL(rounded shoulders) Postural Control Postural Control: Deficits on evaluation Righting Reactions: delayed   Balance Balance Balance Assessed: Yes Static Sitting Balance Static Sitting - Balance Support: Feet supported Static Sitting - Level of Assistance: 5: Stand by assistance Dynamic Sitting Balance Dynamic Sitting - Balance Support: Feet supported;During functional activity Dynamic Sitting - Level of Assistance: 5: Stand by assistance;4:  Min assist Sitting balance - Comments: donning socks; washing LEs  Static Standing Balance Static Standing - Balance Support: Bilateral upper extremity supported;Right upper extremity supported;Left upper extremity supported;During functional activity Static Standing - Level of Assistance: 4: Min assist Dynamic Standing Balance Dynamic Standing - Balance Support: Bilateral upper extremity supported;During functional activity;Right upper extremity supported;Left upper extremity supported Dynamic Standing - Level of Assistance: 4: Min assist Extremity/Trunk Assessment RUE Assessment RUE Assessment: Exceptions to [facility] Active Range of Motion (AROM) Comments: WFL  General Strength Comments: generalized weakness (grossly 3+/5) LUE Assessment LUE Assessment: Exceptions to [facility] Active Range of Motion (AROM) Comments: WFL General Strength Comments: generalized weakness (grossly 3+/5); decreased fine motor      Refer to Care Plan for Long Term Goals  Recommendations for other services: Neuropsych   Discharge Criteria: Patient will be discharged from OT if patient refuses treatment 3  consecutive times without medical reason, if treatment goals not met, if there is a change in medical status, if patient makes no progress towards goals or if patient is discharged from hospital.  The above assessment, treatment plan, treatment alternatives and goals were discussed and mutually agreed upon: by patient  Raymondo Band 04/03/2018, 9:33 AM

## 2018-04-03 NOTE — Progress Notes (Addendum)
Albuterol administered to pt d/t c/o SOB; respiratory contacted; Sats 99%; recommended starting pt on steroid for possible upper respiratory inflammation s/p intubation; will continue to monitor

## 2018-04-03 NOTE — Evaluation (Addendum)
Speech Language Pathology Assessment and Plan  Patient Details  Name: Christina Orozco MRN: [id number] Date of Birth: [birth year]  SLP Diagnosis: Cognitive Impairments;Dysarthria  Rehab Potential: Good ELOS: 5-8 days     Today's Date: 04/03/2018 SLP Individual Time: 0900-1000 SLP Individual Time Calculation (min): 60 min   Problem List:  Patient Active Problem List   Diagnosis Date Noted  . Acute on chronic anemia   . Hypoalbuminemia due to protein-calorie malnutrition ([place])   . Essential hypertension   . Poorly controlled type 2 diabetes mellitus with peripheral neuropathy ([place])   . Morbidly obese ([place])   . Seizure ([place]) 04/02/2018  . PRES (posterior reversible encephalopathy syndrome)   . CKD (chronic kidney disease), stage III ([place])   . Diabetes mellitus type 2 in obese ([place])   . Benign essential HTN   . Hypokalemia   . History of CVA with residual deficit   . Seizures ([place]) 03/25/2018  . Diabetes ([place]) 09/28/2017  . Anemia of chronic disease 08/09/2017  . Hyperlipidemia 07/26/2017  . 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[place]) 07/25/2017  . Anxiety as acute reaction to exceptional stress 04/23/2017  . Insomnia 10/08/2016  . Bacterial vaginitis 10/08/2016  . Neuropathy 06/07/2016  . Gait instability   . Lesion of pons   . Slurred speech   . Dizziness 04/30/2016  . Hyperglycemia 04/30/2016  . Chronic kidney disease (CKD), stage III (moderate) ([place]) 04/30/2016  . Ataxia 04/30/2016  . Chronic right shoulder pain 04/03/2016  . 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 Rhinitis, allergic 08/25/2015  . Asthma, mild intermittent 07/14/2015  . Gastroesophageal reflux disease without esophagitis 06/08/2015  . Alopecia 06/08/2015  . Hypertensive cardiovascular disease 05/05/2015  . Vitamin D deficiency 05/05/2015   Past Medical History:  Past Medical History:  Diagnosis Date  . Anemia    severe  . Asthma   . CKD (chronic kidney disease)    stage IV  . DDD (degenerative disc  disease), cervical   . Diabetes mellitus ([place])   . Dislocated shoulder    right  . GERD (gastroesophageal reflux disease)   . Hypertension   . Neuropathy   . Stroke [facility])    Past Surgical History:  Past Surgical History:  Procedure Laterality Date  . ADENOIDECTOMY    . APPENDECTOMY    . CERVICAL SPINE SURGERY  06/2012   C5-C6  . EYE SURGERY     left eye surgery   . FOOT SURGERY    . LOOP RECORDER INSERTION N/A 08/27/2017   Procedure: LOOP RECORDER INSERTION;  Surgeon: Sanda Klein, MD;  Location: [place] [facility];  Service: Cardiovascular;  Laterality: N/A;  . SHOULDER SURGERY    . TEE WITHOUT CARDIOVERSION N/A 08/27/2017   Procedure: TRANSESOPHAGEAL ECHOCARDIOGRAM (TEE);  Surgeon: Sanda Klein, MD;  Location: [facility];  Service: Cardiovascular;  Laterality: N/A;  . TONSILLECTOMY      Assessment / Plan / Recommendation Clinical Impression Christina Orozco a 47 y.o.right-handed femalewith history of CKD stage III, diabetes mellitus, hypertension, CVAwith no residual weakness. Per chart review and patient,patient lives withher boyfriend and 42 year old daughter. Independent prior to admission. Works doing data entry for a dog show. One level home.Boyfriend works during the day.Presented 03/25/2018 with seizure requiring intubation for airway protection. Noted systolic blood pressures in the 200s as well as elevated blood sugars. Cranial CT scan reviewed, unremarkable for acute intracranial process.CT angiogram of head and neck with no dissection aneurysm or significant stenosis. Urine drug screen negative. MRI of the brain showed indistinct  T2 hyperintensity in the pons that progressed from 07/25/2017 MRI. The patient has had fluctuating pontine signals when compared to studies in 2018 and 2019 favoring atypical PRESor osmotic demyelination. Latest EEG showing a mixture of theta and alpha rhythms seen from the central and temporal regions but did not record  any clinical subclinical seizures. Currently maintained on Keppra for seizure prophylaxis. Plavix initiated for CVA prophylaxis. Subcutaneous heparin for DVT prophylaxis.Nasogastric tube in place for nutritional support.Therapy evaluations completed with recommendations of physical medicine rehab consult.  Pt presents with moderate-mild cognitive lingustic impairments, deficits include overall delayed processing speed impacting high level sustained attention, short term recall, mildly complex problem solving, correction of errors and follow multiple step directions, which was further supported by formal cognitive linguistic assessment utilizing Cognistat. Pt presents with mildly flat affect, and hoarse vocal quality leading to minimal impairments in speech at conversation level due to reduced vocal intensity. Pt presents with [facility] swallow function in consumption of regular and thin liquids via cup/straw, strong cough noted occasional with and more so without PO intake, suggest not related to PO intake and able to protect airway. SLP recommends regular and thin liquid diet with intermittent supervision. Pt was extremely emotional, crying during session, requiring emotional support and encouragement. Pt would benefit from skilled ST services in order to maximize functional independence and reduce burden of care, likely requiring supervision at discharge with continued skilled ST services.   Skilled Therapeutic Interventions          Skilled ST services focused on cognitive skills. SLP facilitated cognitive linguistic assessment and provided education on deficits as well as goals. SLP provided extensive emotional support during treatment. Pt agreeable to plan of care. Pt was left in room with call bell within reach and chair alarm set. SLP reccomends to continue skilled services.   SLP Assessment  Patient will need skilled Speech Lanaguage Pathology Services during CIR admission    Recommendations  SLP  Diet Recommendations: Thin Liquid Administration via: Cup;Straw Medication Administration: Whole meds with liquid Supervision: Intermittent supervision to cue for compensatory strategies Compensations: Slow rate;Small sips/bites Postural Changes and/or Swallow Maneuvers: Seated upright 90 degrees Oral Care Recommendations: Oral care BID Recommendations for Other Services: Neuropsych consult(signs of depression) Patient destination: Home Follow up Recommendations: Outpatient SLP;24 hour supervision/assistance;Home Health SLP Equipment Recommended: None recommended by SLP    SLP Frequency 3 to 5 out of 7 days   SLP Duration  SLP Intensity  SLP Treatment/Interventions 5-8 days   Minumum of 1-2 x/day, 30 to 90 minutes  Cognitive remediation/compensation;Cueing hierarchy;Functional tasks    Pain Pain Assessment Pain Scale: 0-10 Pain Score: 0-No pain  Prior Functioning Cognitive/Linguistic Baseline: Within functional limits Type of Home: Apartment  Lives With: Family;Significant other Available Help at Discharge: Available PRN/intermittently Education: 12th grade Vocation: Full time employment  Short Term Goals: Week 1: SLP Short Term Goal 1 (Week 1): STG=LTG due to ELOS  Refer to Care Plan for Long Term Goals  Recommendations for other services: Neuropsych  Discharge Criteria: Patient will be discharged from SLP if patient refuses treatment 3 consecutive times without medical reason, if treatment goals not met, if there is a change in medical status, if patient makes no progress towards goals or if patient is discharged from hospital.  The above assessment, treatment plan, treatment alternatives and goals were discussed and mutually agreed upon: by patient  Anay Walter  [facility] 04/03/2018, 12:04 PM

## 2018-04-03 NOTE — Progress Notes (Signed)
[place] PHYSICAL MEDICINE & REHABILITATION PROGRESS NOTE  Subjective/Complaints: Patient seen lying in bed this morning.  She states she slept fairly overnight stating, "it was a night".  She keeps her eyes closed throughout encounter.  ROS: Denies CP, SOB, nausea, vomiting, diarrhea.  Objective: Vital Signs: Blood pressure (!) 169/86, pulse 83, temperature 98.6 F (37 C), temperature source Oral, resp. rate 18, height 5\' 3"  (1.6 m), weight 99.3 kg, last menstrual period 03/12/2018, SpO2 95 %. No results found. Recent Labs    04/01/18 0656 04/03/18 0608  WBC 8.3 6.2  HGB 9.5* 8.6*  HCT 31.8* 27.9*  PLT 300 298   Recent Labs    04/01/18 0656 04/03/18 0608  NA  --  137  K  --  3.6  CL  --  109  CO2  --  19*  GLUCOSE  --  172*  BUN  --  34*  CREATININE 1.82* 1.95*  CALCIUM  --  8.3*    Physical Exam: BP (!) 169/86 (BP Location: Right Arm)   Pulse 83   Temp 98.6 F (37 C) (Oral)   Resp 18   Ht 5\' 3"  (1.6 m)   Wt 99.3 kg   LMP 03/12/2018   SpO2 95%   BMI 38.79 kg/m  Constitutional:NAD.  Obese.  HENT: Normocephalic.  Atraumatic. Eyes:EOMI.  No discharge. Cardiovascular:Normal rateand regular rhythm.  No JVD. Respiratory:Effort normal and breath sounds normal.  GI:She exhibitsno distension. There isno tenderness.  Musculoskeletal: She exhibitsedema.  Neurological:Patient is alert Follows simple commands. Motor: 5/5 throughout  Psychiatric:Very flat  Assessment/Plan: 1. Functional deficits secondary to seizures which require 3+ hours per day of interdisciplinary therapy in a comprehensive inpatient rehab setting.  Physiatrist is providing close team supervision and 24 hour management of active medical problems listed below.  Physiatrist and rehab team continue to assess barriers to discharge/monitor patient progress toward functional and medical goals  Care Tool:  Bathing              Bathing assist       Upper Body  Dressing/Undressing Upper body dressing        Upper body assist      Lower Body Dressing/Undressing Lower body dressing            Lower body assist       Toileting Toileting    Toileting assist       Transfers Chair/bed transfer  Transfers assist     Chair/bed transfer assist level: Contact Guard/Touching assist     Locomotion Ambulation   Ambulation assist              Walk 10 feet activity   Assist           Walk 50 feet activity   Assist           Walk 150 feet activity   Assist           Walk 10 feet on uneven surface  activity   Assist           Wheelchair     Assist               Wheelchair 50 feet with 2 turns activity    Assist            Wheelchair 150 feet activity     Assist            Medical Problem List and Plan: 1.Decreased functional abilitysecondary to seizures thought to  be secondary to uncontrolled hypertension/PRESversus hypoglycemia  Begin CIR  Notes reviewed- seizures with abnormal brain imaging, images reviewed- abnormality of questionable etiology, labs reviewed 2. DVT Prophylaxis/Anticoagulation: Subcutaneous heparin. Monitor for any bleeding episodes 3. Pain Management:Oxycodone as needed 4. Mood:Provide emotional support 5. Neuropsych: This patientis ?fullycapable of making decisions on herown behalf. 6. Skin/Wound Care:Routine skin checks, encourage appropriate nutrition 7. Fluids/Electrolytes/Nutrition:Routine in and outs  8.Seizure disorder. Keppra 500 mg twice daily 9.Hypertension. Norvasc 10 mg daily, Coreg 25 mg twice daily, hydralazine 100 mg every 8 hours,Cardura 2 mg daily.   Monitor with increased mobility 10.Diabetes mellitus with peripheral neuropathy. Hemoglobin A1c 12.5. SSI.  Patient on Tresiba 60 units daily prior to admission.Resume as needed. Diabetic teaching  Monitor with increased mobility 11.CKD stage III.  Monitor urine output  Creatinine 1.95 on 10/23  Encourage fluids 12.Asthma. Continue nebulizers as directed. 13.Hyperlipidemia. Lipitor 14.  Hypoalbuminemia  Supplement initiated on 10/23 15.  Acute on chronic anemia  Hemoglobin 8.6 on 10/23  Continue to monitor 16.  Morbid obesity  Encourage weight loss   LOS: 1 days A FACE TO FACE EVALUATION WAS PERFORMED  Christina Orozco Christina Orozco 04/03/2018, 8:35 AM

## 2018-04-03 NOTE — Progress Notes (Signed)
Social Work Psychosocial Assessment and Plan  Patient Details  Name: Christina Orozco MRN: [id number] Date of Birth: 12/24/1970  Today's Date: 04/03/2018  Problem List:  Patient Active Problem List   Diagnosis Date Noted  . Acute on chronic anemia   . Hypoalbuminemia due to protein-calorie malnutrition ([place])   . Essential hypertension   . Poorly controlled type 2 diabetes mellitus with peripheral neuropathy ([place])   . Morbidly obese ([place])   . Seizure ([place]) 04/02/2018  . PRES (posterior reversible encephalopathy syndrome)   . CKD (chronic kidney disease), stage III ([place])   . Diabetes mellitus type 2 in obese ([place])   . Benign essential HTN   . Hypokalemia   . History of CVA with residual deficit   . Seizures ([place]) 03/25/2018  . Diabetes ([place]) 09/28/2017  . Anemia of chronic disease 08/09/2017  . Hyperlipidemia 07/26/2017  . 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[place]) 07/25/2017  . Anxiety as acute reaction to exceptional stress 04/23/2017  . Insomnia 10/08/2016  . Bacterial vaginitis 10/08/2016  . Neuropathy 06/07/2016  . Gait instability   . Lesion of pons   . Slurred speech   . Dizziness 04/30/2016  . Hyperglycemia 04/30/2016  . Chronic kidney disease (CKD), stage III (moderate) ([place]) 04/30/2016  . Ataxia 04/30/2016  . Chronic right shoulder pain 04/03/2016  . 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 Rhinitis, allergic 08/25/2015  . Asthma, mild intermittent 07/14/2015  . Gastroesophageal reflux disease without esophagitis 06/08/2015  . Alopecia 06/08/2015  . Hypertensive cardiovascular disease 05/05/2015  . Vitamin D deficiency 05/05/2015   Past Medical History:  Past Medical History:  Diagnosis Date  . Anemia    severe  . Asthma   . CKD (chronic kidney disease)    stage IV  . DDD (degenerative disc disease), cervical   . Diabetes mellitus ([place])   . Dislocated shoulder    right  . GERD (gastroesophageal reflux disease)   . Hypertension   . Neuropathy   . Stroke [facility])     Past Surgical History:  Past Surgical History:  Procedure Laterality Date  . ADENOIDECTOMY    . APPENDECTOMY    . CERVICAL SPINE SURGERY  06/2012   C5-C6  . EYE SURGERY     left eye surgery   . FOOT SURGERY    . LOOP RECORDER INSERTION N/A 08/27/2017   Procedure: LOOP RECORDER INSERTION;  Surgeon: Sanda Klein, MD;  Location: [facility];  Service: Cardiovascular;  Laterality: N/A;  . SHOULDER SURGERY    . TEE WITHOUT CARDIOVERSION N/A 08/27/2017   Procedure: TRANSESOPHAGEAL ECHOCARDIOGRAM (TEE);  Surgeon: Sanda Klein, MD;  Location: [facility] ENDOSCOPY;  Service: Cardiovascular;  Laterality: N/A;  . TONSILLECTOMY     Social History:  reports that she has never smoked. She has never used smokeless tobacco. She reports that she does not drink alcohol or use drugs.  Family / Support Systems Marital Status: Single Patient Roles: Partner, Parent Spouse/Significant Other: boyfriend, Wille Glaser @ (C) [phone] Children: pt has a 18 yr old daughter in the home Other Supports: brother, [place]ica Franson (local) @ (C) [phone] Anticipated Caregiver: boyfriend to be primary  Ability/Limitations of Caregiver: Wille Glaser works 3rd shift (10:45pm-3:30am)  Caregiver Availability: 24/7 Family Dynamics: Pt describes boyfriend and local family members is very supportive.  Patient feels that they will work together to provide any support she needs.  Social History Preferred language: English Religion: Baptist Cultural Background: NA Read: Yes Write: Yes Employment Status: Employed Name of Employer: [employer] Length  of Employment: (A few years per patient) Return to Work Plans: Patient tearfully states, "I cannot go back to work.  I just cannot do it anymore."  Citing chronic medical issues. Legal Hisotry/Current Legal Issues: None Guardian/Conservator: None - per MD, pt is ?fully capable of making decisions on her own behalf   Abuse/Neglect Abuse/Neglect Assessment Can Be Completed:  Yes Physical Abuse: Denies Verbal Abuse: Denies Sexual Abuse: Denies Exploitation of patient/patient's resources: Denies Self-Neglect: Denies  Emotional Status Pt's affect, behavior adn adjustment status: Patient tearful throughout assessment interview and makes several statements about the stresses of her financial situation and ongoing medical cost.  Patient with very flat affect and with labored breathing but is able to complete the interview.  Patient reports feeling overwhelmed by her chronic health issues and feels she can no longer work full-time and requests assistance with disability application.  Feel patient would benefit from neuropsychology involvement or coping issues and will refer. Recent Psychosocial Issues: As noted, patient reports ongoing financial stressors due to chronic medical issues and feeling overwhelmed by this. Pyschiatric History: None Substance Abuse History: None  Patient / Family Perceptions, Expectations & Goals Pt/Family understanding of illness & functional limitations: Patient and family with general understanding of medical issues and current state of debility/need for CIR Premorbid pt/family roles/activities: Patient was working full-time, however, notes medical appointments were affecting her work schedule and commitments. Anticipated changes in roles/activities/participation: Dependent on goals, patient may need 24/7 supervision which boyfriend and family will provide. Pt/family expectations/goals: Patient states her primary goal is to receive assistance with disability application while she is here.  Community Resources [employer]: Other (Comment)([facility]) Premorbid Home Care/DME Agencies: None Transportation available at discharge: Yes Resource referrals recommended: Neuropsychology  Discharge Planning Living Arrangements: Spouse/significant other, Children Support Systems: Spouse/significant  other, Children, Other relatives Type of Residence: Private residence Insurance Resources: Multimedia programmer (specify)(BCBS) Financial Resources: Employment Financial Screen Referred: No Living Expenses: Education officer, community Management: Patient, Significant Other Does the patient have any problems obtaining your medications?: Yes (Describe)(Patient reports she cannot afford co-pays for medicines) Home Management: pt and boyfriend Patient/Family Preliminary Plans: Patient to return home with boyfriend and daughter. 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  Upon entering room, patient states, "I need help because I cannot afford my doctor visits or my medicines anymore."  Patient has very flat affect and tearful at times as she talks about the financial stressors from chronic medical issues.  Patient does have good support from boyfriend and local family.  Her primary concern while on CIR is to regain strength but also to receive assistance with SSD applications.  Feel patient would benefit from neuropsychology involvement to provide additional coping support and will refer. Trana Ressler 04/03/2018, 10:56 AM

## 2018-04-04 ENCOUNTER — Inpatient Hospital Stay (HOSPITAL_COMMUNITY): Payer: BLUE CROSS/BLUE SHIELD | Admitting: Physical Therapy

## 2018-04-04 ENCOUNTER — Inpatient Hospital Stay (HOSPITAL_COMMUNITY): Payer: BLUE CROSS/BLUE SHIELD

## 2018-04-04 ENCOUNTER — Encounter: Payer: Self-pay | Admitting: Cardiovascular Disease

## 2018-04-04 ENCOUNTER — Inpatient Hospital Stay (HOSPITAL_COMMUNITY): Payer: BLUE CROSS/BLUE SHIELD | Admitting: Speech Pathology

## 2018-04-04 ENCOUNTER — Inpatient Hospital Stay (HOSPITAL_COMMUNITY): Payer: BLUE CROSS/BLUE SHIELD | Admitting: Occupational Therapy

## 2018-04-04 DIAGNOSIS — N189 Chronic kidney disease, unspecified: Secondary | ICD-10-CM

## 2018-04-04 DIAGNOSIS — J4520 Mild intermittent asthma, uncomplicated: Secondary | ICD-10-CM

## 2018-04-04 DIAGNOSIS — Z862 Personal history of diseases of the blood and blood-forming organs and certain disorders involving the immune mechanism: Secondary | ICD-10-CM

## 2018-04-04 DIAGNOSIS — R0602 Shortness of breath: Secondary | ICD-10-CM

## 2018-04-04 DIAGNOSIS — B379 Candidiasis, unspecified: Secondary | ICD-10-CM

## 2018-04-04 DIAGNOSIS — N184 Chronic kidney disease, stage 4 (severe): Secondary | ICD-10-CM

## 2018-04-04 DIAGNOSIS — N183 Chronic kidney disease, stage 3 (moderate): Secondary | ICD-10-CM

## 2018-04-04 LAB — GLUCOSE, CAPILLARY
GLUCOSE-CAPILLARY: 207 mg/dL — AB (ref 70–99)
Glucose-Capillary: 131 mg/dL — ABNORMAL HIGH (ref 70–99)
Glucose-Capillary: 182 mg/dL — ABNORMAL HIGH (ref 70–99)
Glucose-Capillary: 198 mg/dL — ABNORMAL HIGH (ref 70–99)
Glucose-Capillary: 206 mg/dL — ABNORMAL HIGH (ref 70–99)
Glucose-Capillary: 176 mg/dL — ABNORMAL HIGH (ref 70–99)

## 2018-04-04 MED ORDER — FUROSEMIDE 40 MG PO TABS
40.0000 mg | ORAL_TABLET | Freq: Every day | ORAL | Status: DC
  Administered 2018-04-04 – 2018-04-13 (×10): 40 mg via ORAL
  Filled 2018-04-04 (×10): qty 1

## 2018-04-04 MED ORDER — INSULIN DEGLUDEC 100 UNIT/ML ~~LOC~~ SOPN: 10.0000 [IU] | PEN_INJECTOR | Freq: Every day | SUBCUTANEOUS | Status: DC

## 2018-04-04 MED ORDER — LEVOFLOXACIN 250 MG PO TABS
250.0000 mg | ORAL_TABLET | Freq: Every day | ORAL | Status: DC
  Administered 2018-04-05 – 2018-04-10 (×6): 250 mg via ORAL
  Filled 2018-04-04 (×6): qty 1

## 2018-04-04 MED ORDER — LEVOFLOXACIN 500 MG PO TABS
500.0000 mg | ORAL_TABLET | Freq: Once | ORAL | Status: AC
  Administered 2018-04-04: 500 mg via ORAL
  Filled 2018-04-04: qty 1

## 2018-04-04 MED ORDER — ALBUTEROL SULFATE (2.5 MG/3ML) 0.083% IN NEBU
2.5000 mg | INHALATION_SOLUTION | Freq: Three times a day (TID) | RESPIRATORY_TRACT | Status: DC
  Administered 2018-04-05 – 2018-04-06 (×5): 2.5 mg via RESPIRATORY_TRACT
  Filled 2018-04-04 (×8): qty 3

## 2018-04-04 MED ORDER — INSULIN GLARGINE 100 UNIT/ML ~~LOC~~ SOLN
10.0000 [IU] | Freq: Every day | SUBCUTANEOUS | Status: DC
  Administered 2018-04-04 – 2018-04-05 (×2): 10 [IU] via SUBCUTANEOUS
  Filled 2018-04-04 (×2): qty 0.1

## 2018-04-04 MED ORDER — ALPRAZOLAM 0.25 MG PO TABS
0.5000 mg | ORAL_TABLET | Freq: Once | ORAL | Status: AC
  Administered 2018-04-04: 0.5 mg via ORAL
  Filled 2018-04-04: qty 2

## 2018-04-04 MED ORDER — FLUTICASONE PROPIONATE 50 MCG/ACT NA SUSP
1.0000 | Freq: Every day | NASAL | Status: DC
  Administered 2018-04-05 – 2018-04-13 (×9): 1 via NASAL
  Filled 2018-04-04: qty 16

## 2018-04-04 MED ORDER — FLUCONAZOLE 150 MG PO TABS
150.0000 mg | ORAL_TABLET | Freq: Once | ORAL | Status: AC
  Administered 2018-04-04: 150 mg via ORAL
  Filled 2018-04-04: qty 1

## 2018-04-04 NOTE — Progress Notes (Signed)
[place] PHYSICAL MEDICINE & REHABILITATION PROGRESS NOTE  Subjective/Complaints: Patient seen sitting up in bed this morning.  She states she slept fairly overnight due to shortness of breath.  She notes history of asthma.  She also states that she has a yeast infection.  Discussed with nursing as well -O2 sats within normal limits.  ROS: + SOB.  Denies CP, nausea, vomiting, diarrhea.  Objective: Vital Signs: Blood pressure (!) 164/82, pulse 86, temperature 98.6 F (37 C), temperature source Oral, resp. rate 18, height 5\' 3"  (1.6 m), weight 99.3 kg, last menstrual period 03/12/2018, SpO2 98 %. No results found. Recent Labs    04/03/18 0608  WBC 6.2  HGB 8.6*  HCT 27.9*  PLT 298   Recent Labs    04/03/18 0608  NA 137  K 3.6  CL 109  CO2 19*  GLUCOSE 172*  BUN 34*  CREATININE 1.95*  CALCIUM 8.3*    Physical Exam: BP (!) 164/82 (BP Location: Right Arm)   Pulse 86   Temp 98.6 F (37 C) (Oral)   Resp 18   Ht 5\' 3"  (1.6 m)   Wt 99.3 kg   LMP 03/12/2018   SpO2 98%   BMI 38.79 kg/m  Constitutional:NAD.  Obese.  HENT: Normocephalic.  Atraumatic. Eyes:EOMI.  No discharge. Cardiovascular:RRR.  No JVD. Respiratory:Effort normal and breath sounds normal.  + [place].  No wheezes, ?  Rales at bases GI:She exhibitsno distension. There isno tenderness.  Musculoskeletal: She exhibitsedema.  Neurological:Patient is alert Follows simple commands. Motor: 5/5 throughout  Psychiatric:Very flat  Assessment/Plan: 1. Functional deficits secondary to seizures which require 3+ hours per day of interdisciplinary therapy in a comprehensive inpatient rehab setting.  Physiatrist is providing close team supervision and 24 hour management of active medical problems listed below.  Physiatrist and rehab team continue to assess barriers to discharge/monitor patient progress toward functional and medical goals  Care Tool:  Bathing    Body parts bathed by patient: Right arm,  Left arm, Chest, Abdomen, Front perineal area, Buttocks, Right upper leg, Face, Left lower leg, Right lower leg, Left upper leg         Bathing assist Assist Level: Minimal Assistance - Patient > 75%     Upper Body Dressing/Undressing Upper body dressing   What is the patient wearing?: Hospital gown only    Upper body assist Assist Level: Set up assist    Lower Body Dressing/Undressing Lower body dressing      What is the patient wearing?: Incontinence brief     Lower body assist Assist for lower body dressing: Minimal Assistance - Patient > 75%     Toileting Toileting    Toileting assist Assist for toileting: Minimal Assistance - Patient > 75%     Transfers Chair/bed transfer  Transfers assist     Chair/bed transfer assist level: Minimal Assistance - Patient > 75%     Locomotion Ambulation   Ambulation assist      Assist level: Minimal Assistance - Patient > 75% Assistive device: Walker-rolling Max distance: 100'   Walk 10 feet activity   Assist     Assist level: Minimal Assistance - Patient > 75% Assistive device: Walker-rolling   Walk 50 feet activity   Assist    Assist level: Minimal Assistance - Patient > 75% Assistive device: Walker-rolling    Walk 150 feet activity   Assist Walk 150 feet activity did not occur: Safety/medical concerns         Walk 10  feet on uneven surface  activity   Assist Walk 10 feet on uneven surfaces activity did not occur: Safety/medical concerns         Wheelchair     Assist Will patient use wheelchair at discharge?: No             Wheelchair 50 feet with 2 turns activity    Assist            Wheelchair 150 feet activity     Assist            Medical Problem List and Plan: 1.Decreased functional abilitysecondary to seizures thought to be secondary to uncontrolled hypertension/PRESversus hypoglycemia  Continue CIR 2. DVT Prophylaxis/Anticoagulation:  Subcutaneous heparin. Monitor for any bleeding episodes 3. Pain Management:Oxycodone as needed 4. Mood:Provide emotional support 5. Neuropsych: This patientis ?fullycapable of making decisions on herown behalf. 6. Skin/Wound Care:Routine skin checks, encourage appropriate nutrition 7. Fluids/Electrolytes/Nutrition:Routine in and outs  8.Seizure disorder. Keppra 500 mg twice daily 9.Hypertension. Norvasc 10 mg daily, Coreg 25 mg twice daily, hydralazine 100 mg every 8 hours,Cardura 2 mg daily.   Elevated, will consider increase tomorrow 10.Diabetes mellitus with peripheral neuropathy. Hemoglobin A1c 12.5. SSI.  Patient on Tresiba 60 units daily prior to admission.Resume as needed. Diabetic teaching  Tresiba 10 nightly started on 10/24  Monitor with increased mobility 11.CKD stage III. Monitor urine output  Creatinine 1.95 on 10/23  Encourage fluids 12.Asthma. Continue nebulizers as directed.  Chest x-ray ordered on 10/24  Wean supplemental oxygen as tolerated  Will consider Lasix 13.Hyperlipidemia. Lipitor 14.  Hypoalbuminemia  Supplement initiated on 10/23 15.  Acute on chronic anemia  Hemoglobin 8.6 on 10/23  Continue to monitor 16.  Morbid obesity  Encourage weight loss 17.  Candidiasis  Diflucan x1 ordered on 10/24   LOS: 2 days A FACE TO FACE EVALUATION WAS PERFORMED  Christina Orozco 04/04/2018, 8:38 AM

## 2018-04-04 NOTE — Progress Notes (Addendum)
Patient claims she has SOB , lungs clear on auscultation 02sat without 02 98%. Patient is anxious and requesting 02 therapy. Dan PA notified order for CXR done stat and new order for x1 anxiety med ordered. Continued to monitor. Patient requested for chaplain.

## 2018-04-04 NOTE — Progress Notes (Signed)
PHARMACY NOTE:  ANTIMICROBIAL RENAL DOSAGE ADJUSTMENT  Current antimicrobial regimen includes a mismatch between antimicrobial dosage and estimated renal function.  As per policy approved by the Pharmacy & Therapeutics and Medical Executive Committees, the antimicrobial dosage will be adjusted accordingly.  Current antimicrobial dosage:  Levofloxacin 500mg  PO daily  Indication: PNA  Renal Function:  Estimated Creatinine Clearance: 41.4 mL/min (A) (by C-G formula based on SCr of 1.95 mg/dL (H)). []      On intermittent HD, scheduled: []      On CRRT    Antimicrobial dosage has been changed to:  Levofloxacin 500mg  x 1, then 250mg  PO q24h  Additional comments:   Atreus Hasz A. Levada Dy, PharmD, [place] Pager: [phone] Please utilize Amion for appropriate phone number to reach the unit pharmacist ([place])    04/04/2018 10:39 AM

## 2018-04-04 NOTE — Progress Notes (Signed)
Physical Therapy Session Note  Patient Details  Name: Christina Orozco MRN: [id number] Date of Birth: Dec 22, 1970  Today's Date: 04/04/2018 PT Individual Time: 1410-1510   60 min   Short Term Goals: Week 1:  PT Short Term Goal 1 (Week 1): Pt will ambulate x 150 ft with LRAD and SBA PT Short Term Goal 2 (Week 1): Pt will maintain dynamic standing balance with LRAD and CGA consistently PT Short Term Goal 3 (Week 1): Pt will perform transfers with SBA consistently  Skilled Therapeutic Interventions/Progress Updates:   Pt received supine in bed and agreeable to PT. Supine>sit transfer with supervision assist and min cues for bed feature use. Pt states that she is having a hard time breathing and wants Nt to assist her to BR. Respiratory therapy also present to give breathing treatment.   Ambualtion in room with NT to bathroom and then through room with PT to wash hands at sink and return to [facility].  and supervision assist   Gait training with RW x 122 and supervision assist fading to min assist with intermittent L knee instability.   Nustep endurance training 3 min x 2 with prolonged break between bouts. SpO2 >98% throughout.   WC mobility instruced by PT 138f, 1055f and 9033fith supervision assist from PT and min cues for turning technique and posture in WC.    Pt returned to room and performed stand pivot  transfer to bed with supervision assist and RW. Sit>supine completed with supervision assist and left supine in bed with call bell in reach and all needs met.          Therapy Documentation Precautions:  Precautions Precautions: Fall Precaution Comments: seizure Restrictions Weight Bearing Restrictions: No General:   missed minutes - 15 min. ( refused. )   Pain: Pain Assessment Pain Score: 0-No pain    Therapy/Group: Individual Therapy  AusLorie Phenix/24/2019, 2:04 PM

## 2018-04-04 NOTE — Progress Notes (Signed)
SLP Cancellation Note  Patient Details Name: Christina Orozco MRN: [id number] DOB: 1970-12-19   Cancelled treatment:        Attempted to see pt for skilled ST; however, pt has complaints of shortness of breath even on supplemental oxygen.  O2 sats WFL both with and without supplementation.  Stat chest x ray ordered to rule out PNA and one time anxiety med ordered.  Transport arrived to take pt to x ray.  As a result, pt missed 60 min of therapy due to imaging and anxiety.  Will follow up as pt is available.                                                                                                  Miles Leyda, Selinda Orion 04/04/2018, 9:35 AM

## 2018-04-04 NOTE — Progress Notes (Signed)
   04/04/18 1300  Clinical Encounter Type  Visited With Patient and family together;Health care provider  Visit Type Initial;Spiritual support;Psychological support  Referral From Patient  Spiritual Encounters  Spiritual Needs Emotional;Prayer  Stress Factors  Patient Stress Factors Exhausted;Major life changes;Financial concerns  Family Stress Factors Financial concerns;Major life changes   Spoke w/ an Therapist, sports and PA prior to meeting w/ pt and her long-term boyfriend (per their report they have three kids together, 22, 93, 71, and two grandkids from the oldest) in pt's rm.   Listened to concerns and laments, boyfriend dominated conversation and answered for her repeatedly.  Redirected questions to her but didn't receive much response.   Pt's boyfriend requested prayer when I went to leave to allow pt to rest.  Prayed w/ pt and boyfriend.  Updated care RN.  Myra Gianotti resident, [phone]

## 2018-04-04 NOTE — Progress Notes (Signed)
Chest x-ray completed question left lower lobe infiltrate with associated effusion.  Will begin Levaquin.  Encourage incentive spirometer.  Oxygen saturation currently 98% room air.  Discussed with patient compensatory breathing techniques.

## 2018-04-04 NOTE — Progress Notes (Addendum)
Flat affect, very short with answers and notes of anxiety. May need psych eval. Pt was given an incentive spirometer, she was educated on it as well.

## 2018-04-04 NOTE — Progress Notes (Addendum)
Pt notes she has a yeast infection, requesting ABT tx. She experienced respiratory distressed (SOB) administered 1L N/C. Pt requested breathing tx, notified respiratory therapy.

## 2018-04-04 NOTE — Progress Notes (Signed)
Occupational Therapy Session Note  Patient Details  Name: Christina Orozco MRN: [id number] Date of Birth: 05/27/1971  Today's Date: 04/04/2018 OT Individual Time: 1000-1100 OT Individual Time Calculation (min): 60 min    Short Term Goals: Week 1:  OT Short Term Goal 1 (Week 1): Pt will maintain standing balance during LB dressing with CGA.  OT Short Term Goal 2 (Week 1): Pt will perform toilet/BSC transfer with CGA and LRAD.  OT Short Term Goal 3 (Week 1): Pt will perform 3/3 steps of LB dressing.  OT Short Term Goal 4 (Week 1): Pt will require no more than 1 safety cue during ADL task completion.   Skilled Therapeutic Interventions/Progress Updates:    Pt seen for ADL training with a focus on activity tolerance, balance, coordination.  Pt received in bed expressing she was in a state of disbelief and crying about what she was dealing with.  Took time to allow pt to share and use breathing to relax. She continued to express anxiety and suggested a shower to distract her. She agreed.  Cues to use arm to push up from bed when standing vs hands on the walker.   Pt ambulated with RW with cues for walker management and min A to toilet,  Toileted with CGA,  Ambulated to shower then sat on bench.  Bathed with S except for CGA when standing. Pt ambulated to w/c in room to dress.  In standing to pull her underwear up, pt had a los of balance forward with min A to recover.  Decrease trunk coordination.  Pt donned UB clothing with S and LB with CGA when standing. Total A with TED hose and shoes.    Pt's brother arrived.  Chair alarm belt placed on pt and pt resting in w/c with all needs met.   Therapy Documentation  Precautions:  Precautions Precautions: Fall Precaution Comments: seizure Restrictions Weight Bearing Restrictions: No     Pain: Pain Assessment Pain Score: 0-No pain ADL: Refer to CARE TOOL     Therapy/Group: Individual Therapy  SAGUIER,JULIA 04/04/2018, 1:05 PM 

## 2018-04-05 ENCOUNTER — Inpatient Hospital Stay (HOSPITAL_COMMUNITY): Payer: BLUE CROSS/BLUE SHIELD | Admitting: Physical Therapy

## 2018-04-05 ENCOUNTER — Inpatient Hospital Stay (HOSPITAL_COMMUNITY): Payer: BLUE CROSS/BLUE SHIELD | Admitting: Occupational Therapy

## 2018-04-05 ENCOUNTER — Encounter (HOSPITAL_BASED_OUTPATIENT_CLINIC_OR_DEPARTMENT_OTHER): Payer: BLUE CROSS/BLUE SHIELD

## 2018-04-05 ENCOUNTER — Inpatient Hospital Stay (HOSPITAL_COMMUNITY): Payer: BLUE CROSS/BLUE SHIELD | Admitting: Speech Pathology

## 2018-04-05 ENCOUNTER — Inpatient Hospital Stay (HOSPITAL_COMMUNITY): Payer: BLUE CROSS/BLUE SHIELD

## 2018-04-05 DIAGNOSIS — J181 Lobar pneumonia, unspecified organism: Secondary | ICD-10-CM

## 2018-04-05 DIAGNOSIS — J189 Pneumonia, unspecified organism: Secondary | ICD-10-CM

## 2018-04-05 LAB — GLUCOSE, CAPILLARY
GLUCOSE-CAPILLARY: 176 mg/dL — AB (ref 70–99)
Glucose-Capillary: 213 mg/dL — ABNORMAL HIGH (ref 70–99)
Glucose-Capillary: 142 mg/dL — ABNORMAL HIGH (ref 70–99)
Glucose-Capillary: 163 mg/dL — ABNORMAL HIGH (ref 70–99)

## 2018-04-05 MED ORDER — MAGIC MOUTHWASH W/LIDOCAINE
10.0000 mL | Freq: Three times a day (TID) | ORAL | Status: DC
  Administered 2018-04-05 – 2018-04-13 (×22): 10 mL via ORAL
  Filled 2018-04-05 (×25): qty 10

## 2018-04-05 MED ORDER — DOXAZOSIN MESYLATE 4 MG PO TABS
4.0000 mg | ORAL_TABLET | Freq: Every day | ORAL | Status: DC
  Administered 2018-04-06 – 2018-04-13 (×8): 4 mg via ORAL
  Filled 2018-04-05 (×8): qty 1

## 2018-04-05 NOTE — Progress Notes (Signed)
Occupational Therapy Session Note  Patient Details  Name: Christina Orozco MRN: [id number] Date of Birth: 15-Jul-1970  Today's Date: 04/05/2018 OT Individual Time: 1500-1600 OT Individual Time Calculation (min): 60 min    Short Term Goals: Week 1:  OT Short Term Goal 1 (Week 1): Pt will maintain standing balance during LB dressing with CGA.  OT Short Term Goal 2 (Week 1): Pt will perform toilet/BSC transfer with CGA and LRAD.  OT Short Term Goal 3 (Week 1): Pt will perform 3/3 steps of LB dressing.  OT Short Term Goal 4 (Week 1): Pt will require no more than 1 safety cue during ADL task completion.   Skilled Therapeutic Interventions/Progress Updates:    1:1. Pt received seated in bed upon arrival, reluctant but agreeable to tx. Pt completes ambulatory transfer with RW with CGA fading to supervision VC for pushing BSC and reach back. Pt able to void urine and complete clothing management/hygiene with supervision. Pt requires VC for flushing toilet. Attempted to educate pt on shower DME however pt prefers soaking in tub. Educated pt that at this point in time would not recommend getting down into tub until mobility improves. Pt completes standing ball toss to rebounder for standing tolerance/balance and BUE coordination with CGA-min A for posterior lean on standard tile, compliant surface and foam wedge. Pt completes kitchen search for items throughout cabinets/appliances at various heights with VC for RW management in tight spaces for safety awareness. Exited session with pt seated in bed, call light in reach and exit alarm on  Therapy Documentation Precautions:  Precautions Precautions: Fall Precaution Comments: seizure Restrictions Weight Bearing Restrictions: No   Therapy/Group: Individual Therapy  Tonny Branch 04/05/2018, 4:06 PM

## 2018-04-05 NOTE — Progress Notes (Signed)
Physical Therapy Session Note  Patient Details  Name: Christina Orozco MRN: [id number] Date of Birth: 1970/07/04  Today's Date: 04/05/2018 PT Individual Time: 1330-1430 60 min  Short Term Goals: Week 1:  PT Short Term Goal 1 (Week 1): Pt will ambulate x 150 ft with LRAD and SBA PT Short Term Goal 2 (Week 1): Pt will maintain dynamic standing balance with LRAD and CGA consistently PT Short Term Goal 3 (Week 1): Pt will perform transfers with SBA consistently  Skilled Therapeutic Interventions/Progress Updates:   Pt received supine in bed and agreeable to PT. Pt's long term boyfriend present with questions about prognosis. Education to pt and SO on rehab process and potential. Supine>sit transfer with supervision assist and cues for bed features. .    Gait in room to bathroom with min assist from PT. Pt noted to have mild ataxic appearing gait pattern on the LLE. Sit<>stand at toilet with supervision assist from PT for safety.    5xSTS instructed by PT: 20sec (>15 sec indicates increased fall risk)  TUG. 33 sec. 30sec. 25 sec.  Average of 3 trials 29.3 sec. (>15 sec indicates increased fall risk)   Dynamic balance training instructed by PT to perform lateral reach and toss bean bag to target 2x 6 RUE and 1x6 LUE. Min assist throughout with min tactile cues for improve weight shifting to reach outside BOS.   Pt returned to room and performed stand transfer to bed with supervision assist. Sit>supine completed with supervision assist, and left supine in bed with call bell in reach and all needs met.      .           Therapy Documentation Precautions:  Precautions Precautions: Fall Precaution Comments: seizure Restrictions Weight Bearing Restrictions: No Vital Signs: Oxygen Therapy SpO2: 96 % O2 Device: Room Air Pain: Pain Assessment Pain Scale: 0-10 Pain Score: 0-No pain    Therapy/Group: Individual Therapy  [name] 04/05/2018, 1:51 PM

## 2018-04-05 NOTE — Progress Notes (Signed)
Pt requested prn breathing treatment; c/o difficulty breathing; RRT contacted; will continue to monitor

## 2018-04-05 NOTE — Progress Notes (Signed)
[place] PHYSICAL MEDICINE & REHABILITATION PROGRESS NOTE  Subjective/Complaints: Patient seen sitting up in bed this morning.  She states she slept fairly overnight due to breathing difficulties.  Yesterday, she received her first dose of antibiotics due to infiltrates on chest x-ray.  She also received Lasix.  ROS: + SOB.  Denies CP, nausea, vomiting, diarrhea.  Objective: Vital Signs: Blood pressure (!) 151/74, pulse 82, temperature 98.4 F (36.9 C), resp. rate 18, height 5\' 3"  (1.6 m), weight 99.3 kg, last menstrual period 03/12/2018, SpO2 96 %. Dg Chest 2 View  Result Date: 04/04/2018 CLINICAL DATA:  Shortness of Breath EXAM: CHEST - 2 VIEW COMPARISON:  03/30/2018 FINDINGS: Previously seen feeding catheter has been removed in the interval. Loop recorder is again noted. Cardiac shadow is stable. Left basilar opacity is noted with associated left-sided effusion. No bony abnormality is seen. IMPRESSION: Left lower lobe infiltrate with associated effusion. Electronically Signed   By: Inez Catalina M.D.   On: 04/04/2018 10:13   Recent Labs    04/03/18 0608  WBC 6.2  HGB 8.6*  HCT 27.9*  PLT 298   Recent Labs    04/03/18 0608  NA 137  K 3.6  CL 109  CO2 19*  GLUCOSE 172*  BUN 34*  CREATININE 1.95*  CALCIUM 8.3*    Physical Exam: BP (!) 151/74 (BP Location: Left Arm)   Pulse 82   Temp 98.4 F (36.9 C)   Resp 18   Ht 5\' 3"  (1.6 m)   Wt 99.3 kg   LMP 03/12/2018   SpO2 96%   BMI 38.79 kg/m  Constitutional:NAD.  Obese.  HENT: Normocephalic.  Atraumatic. Eyes:EOMI.  No discharge. Cardiovascular:RRR.  No JVD. Respiratory:Effort normal and breath sounds normal. No wheezes, rales at bases improved GI:She exhibitsno distension. There isno tenderness.  Musculoskeletal: She exhibitsmild edema.  Neurological:Patient is alert Follows simple commands. Motor: 5/5 throughout  Psychiatric:Very flat  Assessment/Plan: 1. Functional deficits secondary to seizures  which require 3+ hours per day of interdisciplinary therapy in a comprehensive inpatient rehab setting.  Physiatrist is providing close team supervision and 24 hour management of active medical problems listed below.  Physiatrist and rehab team continue to assess barriers to discharge/monitor patient progress toward functional and medical goals  Care Tool:  Bathing    Body parts bathed by patient: Right arm, Left arm, Chest, Abdomen, Front perineal area, Buttocks, Right upper leg, Face, Left lower leg, Right lower leg, Left upper leg         Bathing assist Assist Level: Contact Guard/Touching assist     Upper Body Dressing/Undressing Upper body dressing   What is the patient wearing?: Pull over shirt    Upper body assist Assist Level: Set up assist    Lower Body Dressing/Undressing Lower body dressing      What is the patient wearing?: Pants, Underwear/pull up     Lower body assist Assist for lower body dressing: Contact Guard/Touching assist     Toileting Toileting    Toileting assist Assist for toileting: Contact Guard/Touching assist     Transfers Chair/bed transfer  Transfers assist     Chair/bed transfer assist level: Contact Guard/Touching assist     Locomotion Ambulation   Ambulation assist      Assist level: Minimal Assistance - Patient > 75% Assistive device: Walker-rolling Max distance: 100'   Walk 10 feet activity   Assist     Assist level: Minimal Assistance - Patient > 75% Assistive device: Walker-rolling  Walk 50 feet activity   Assist    Assist level: Minimal Assistance - Patient > 75% Assistive device: Walker-rolling    Walk 150 feet activity   Assist Walk 150 feet activity did not occur: Safety/medical concerns         Walk 10 feet on uneven surface  activity   Assist Walk 10 feet on uneven surfaces activity did not occur: Safety/medical concerns         Wheelchair     Assist Will patient use  wheelchair at discharge?: No             Wheelchair 50 feet with 2 turns activity    Assist            Wheelchair 150 feet activity     Assist            Medical Problem List and Plan: 1.Decreased functional abilitysecondary to seizures thought to be secondary to uncontrolled hypertension/PRESversus hypoglycemia  Continue CIR 2. DVT Prophylaxis/Anticoagulation: Subcutaneous heparin. Monitor for any bleeding episodes 3. Pain Management:Oxycodone as needed 4. Mood:Provide emotional support 5. Neuropsych: This patientis ?fullycapable of making decisions on herown behalf. 6. Skin/Wound Care:Routine skin checks, encourage appropriate nutrition 7. Fluids/Electrolytes/Nutrition:Routine in and outs  8.Seizure disorder. Keppra 500 mg twice daily 9.Hypertension. Norvasc 10 mg daily, Coreg 25 mg twice daily, hydralazine 100 mg every 8 hours, Cardura 2 mg  Remains elevated, will consider further increase in medications persistent 10.Diabetes mellitus with peripheral neuropathy. Hemoglobin A1c 12.5. SSI.  Patient on Tresiba 60 units daily prior to admission.Resume as needed. Diabetic teaching  Tresiba 10 nightly started on 10/24  Remains elevated, will consider further increase if necessary  Monitor with increased mobility 11.CKD stage III. Monitor urine output  Creatinine 1.95 on 10/23  Labs ordered for Monday  Encourage fluids 12.Asthma. Continue nebulizers as directed. 13.Hyperlipidemia. Lipitor 14.  Hypoalbuminemia  Supplement initiated on 10/23 15.  Acute on chronic anemia  Hemoglobin 8.6 on 10/23  Labs ordered for Monday  Continue to monitor 16.  Morbid obesity  Encourage weight loss 17.  Candidiasis  Diflucan x1 ordered on 10/24 18. Pneumonia  Chest x-ray reviewed- LLL infiltrate with effusion  Wean supplemental oxygen as tolerated  Lasix 40 daily started on 10/24  Levaquin started on 10/24   LOS: 3 days A FACE TO FACE  EVALUATION WAS PERFORMED  Teneil Shiller [name] 04/05/2018, 8:48 AM

## 2018-04-05 NOTE — Progress Notes (Signed)
Occupational Therapy Session Note  Patient Details  Name: Christina Orozco MRN: [id number] Date of Birth: April 28, 1971  Today's Date: 04/05/2018 OT Individual Time: 1030-1145 OT Individual Time Calculation (min): 75 min    Short Term Goals: Week 1:  OT Short Term Goal 1 (Week 1): Pt will maintain standing balance during LB dressing with CGA.  OT Short Term Goal 2 (Week 1): Pt will perform toilet/BSC transfer with CGA and LRAD.  OT Short Term Goal 3 (Week 1): Pt will perform 3/3 steps of LB dressing.  OT Short Term Goal 4 (Week 1): Pt will require no more than 1 safety cue during ADL task completion.   Skilled Therapeutic Interventions/Progress Updates:    Pt received in bed stating that she did not want to get up but eventually agreed to participate.  Pt declined a shower or bath. She sat to EOB to don clothing with CGA to pull clothing over hips in standing.  TED hose and socks donned for pt as she did not want to use her shoes. Pt's vision assessed in more detail in the room.  She has limited L eye convergence and blurry vision with small font.  Covering the L eye does not improve clarity. Pt taken to day room to work on word find.  She found a few words but the small font was straining to her eyes.  Would do well with large print puzzles.   Pt worked on UBE at resistance 3 for 8 minutes for shoulder strength with no pain.   In main gym, used 2# weighted dowel for shoulder exercises but she said her L sh was hurting.  Used seated and standing wall slides which pt could do with full AROM and no pain.  Sit to stand with focus on proper placement of hands.  Pt taken back to room so she could toilet and then wash hands at the sink. Pt set up in w/c with chair alarm and all needs met.   Therapy Documentation Precautions:  Precautions Precautions: Fall Precaution Comments: seizure Restrictions Weight Bearing Restrictions: No    Vital Signs: Oxygen Therapy SpO2: 95 % O2 Device: Room  Air Pain: Pain Assessment Pain Scale: 0-10 Pain Score: 0-No pain  Vision Patient Visual Report: Blurring of vision Vision Assessment?: Yes Eye Alignment: Within Functional Limits Ocular Range of Motion: Within Functional Limits Alignment/Gaze Preference: Within Defined Limits Tracking/Visual Pursuits: Able to track stimulus in all quads without difficulty Convergence: Impaired (comment)(L eye does not converge fully into midline and converges slower than the R eye) Visual Fields: No apparent deficits Depth Perception: Overshoots Additional Comments: Pt is able to read larger font without difficulty, but can not read 10-12 pt font or smaller   Therapy/Group: Individual Therapy  [place] 04/05/2018, 12:29 PM

## 2018-04-05 NOTE — Progress Notes (Signed)
Speech Language Pathology Daily Session Note  Patient Details  Name: Christina Orozco MRN: [id number] Date of Birth: May 28, 1971  Today's Date: 04/05/2018 SLP Individual Time: 0813-8871 SLP Individual Time Calculation (min): 35 min  Short Term Goals: Week 1: SLP Short Term Goal 1 (Week 1): STG=LTG due to ELOS  Skilled Therapeutic Interventions:  Pt was seen for skilled ST targeting cognitive goals.  Session began late due to pt requesting a breathing treatment prior to therapy.  After breathing treatment pt was agreeable to participating in therapy; however, her affect remained flat and pt verbalized a great deal of anxiety over perceived loss of independence.  Pt endorses concerns for memory changes; however, her concerns appear primarily related to amnesia surrounding seizure and subsequent intubation.  SLP discussed that pt will likely never regain her memory surrounding those particular events and that that is fairly typical.  Therapist provided skilled education regarding memory compensatory strategies in the event that pt is having difficulty recalling daily information.  Pt did not appear overly motivated to participate in therapy due to being obviously preoccupied over her loss of independence.  Pt was returned to room and left in bed with bed alarm set and call bell within reach.  Continue per current plan of care.     Pain Pain Assessment Pain Scale: 0-10 Pain Score: 0-No pain  Therapy/Group: Individual Therapy  Deshannon Seide, Selinda Orion 04/05/2018, 12:18 PM

## 2018-04-05 NOTE — Progress Notes (Signed)
Pt requesting breathing treatment; RRT for patient notified; will continue to monitor

## 2018-04-06 ENCOUNTER — Inpatient Hospital Stay (HOSPITAL_COMMUNITY): Payer: BLUE CROSS/BLUE SHIELD | Admitting: Speech Pathology

## 2018-04-06 ENCOUNTER — Inpatient Hospital Stay (HOSPITAL_COMMUNITY): Payer: BLUE CROSS/BLUE SHIELD | Admitting: Physical Therapy

## 2018-04-06 ENCOUNTER — Inpatient Hospital Stay (HOSPITAL_COMMUNITY): Payer: BLUE CROSS/BLUE SHIELD | Admitting: Occupational Therapy

## 2018-04-06 LAB — GLUCOSE, CAPILLARY
GLUCOSE-CAPILLARY: 189 mg/dL — AB (ref 70–99)
Glucose-Capillary: 167 mg/dL — ABNORMAL HIGH (ref 70–99)
Glucose-Capillary: 220 mg/dL — ABNORMAL HIGH (ref 70–99)
Glucose-Capillary: 226 mg/dL — ABNORMAL HIGH (ref 70–99)

## 2018-04-06 MED ORDER — OXYMETAZOLINE HCL 0.05 % NA SOLN
1.0000 | Freq: Two times a day (BID) | NASAL | Status: DC
  Administered 2018-04-06 – 2018-04-07 (×4): 1 via NASAL
  Filled 2018-04-06: qty 15

## 2018-04-06 MED ORDER — DM-GUAIFENESIN ER 30-600 MG PO TB12
1.0000 | ORAL_TABLET | Freq: Two times a day (BID) | ORAL | Status: DC
  Administered 2018-04-06 – 2018-04-13 (×15): 1 via ORAL
  Filled 2018-04-06 (×17): qty 1

## 2018-04-06 MED ORDER — INSULIN GLARGINE 100 UNIT/ML ~~LOC~~ SOLN
14.0000 [IU] | Freq: Every day | SUBCUTANEOUS | Status: DC
  Administered 2018-04-06 – 2018-04-08 (×3): 14 [IU] via SUBCUTANEOUS
  Filled 2018-04-06 (×3): qty 0.14

## 2018-04-06 NOTE — Progress Notes (Signed)
[place] PHYSICAL MEDICINE & REHABILITATION PROGRESS NOTE  Subjective/Complaints: Complains of significant congestion and that she couldn't sleep because of it. Nurse reports anixety is an issue  ROS: Patient denies fever, rash, sore throat, blurred vision, nausea, vomiting, diarrhea,  chest pain, joint or back pain, headache, or mood change.   Objective: Vital Signs: Blood pressure (!) 154/71, pulse 96, temperature 100 F (37.8 C), temperature source Axillary, resp. rate 18, height 5\' 3"  (1.6 m), weight 99.3 kg, last menstrual period 03/12/2018, SpO2 93 %. No results found. No results for input(s): WBC, HGB, HCT, PLT in the last 72 hours. No results for input(s): NA, K, CL, CO2, GLUCOSE, BUN, CREATININE, CALCIUM in the last 72 hours.  Physical Exam: BP (!) 154/71 (BP Location: Right Arm)   Pulse 96   Temp 100 F (37.8 C) (Axillary)   Resp 18   Ht 5\' 3"  (1.6 m)   Wt 99.3 kg   LMP 03/12/2018   SpO2 93%   BMI 38.79 kg/m  Constitutional: No distress . Vital signs reviewed. HEENT: EOMI, oral membranes moist. Sounds very congested Neck: supple Cardiovascular: RRR without murmur. No JVD    Respiratory: CTA Bilaterally without wheezes or rales. Normal effort    GI: BS +, non-tender, non-distended  Musculoskeletal: She exhibitsmild edema.  Neurological:Patient is alert Follows simple commands. Motor: 5/5 stable Psychiatric:flat, doesn't engage much  Assessment/Plan: 1. Functional deficits secondary to seizures which require 3+ hours per day of interdisciplinary therapy in a comprehensive inpatient rehab setting.  Physiatrist is providing close team supervision and 24 hour management of active medical problems listed below.  Physiatrist and rehab team continue to assess barriers to discharge/monitor patient progress toward functional and medical goals  Care Tool:  Bathing  Bathing activity did not occur: (declined) Body parts bathed by patient: Right arm, Left arm,  Chest, Abdomen, Front perineal area, Buttocks, Right upper leg, Face, Left lower leg, Right lower leg, Left upper leg         Bathing assist Assist Level: Contact Guard/Touching assist     Upper Body Dressing/Undressing Upper body dressing   What is the patient wearing?: Pull over shirt    Upper body assist Assist Level: Set up assist    Lower Body Dressing/Undressing Lower body dressing      What is the patient wearing?: Pants, Underwear/pull up     Lower body assist Assist for lower body dressing: Contact Guard/Touching assist     Toileting Toileting    Toileting assist Assist for toileting: Contact Guard/Touching assist     Transfers Chair/bed transfer  Transfers assist     Chair/bed transfer assist level: Supervision/Verbal cueing     Locomotion Ambulation   Ambulation assist      Assist level: Minimal Assistance - Patient > 75% Assistive device: Walker-rolling Max distance: 151ft   Walk 10 feet activity   Assist     Assist level: Minimal Assistance - Patient > 75% Assistive device: Walker-rolling   Walk 50 feet activity   Assist    Assist level: Minimal Assistance - Patient > 75% Assistive device: Walker-rolling    Walk 150 feet activity   Assist Walk 150 feet activity did not occur: Safety/medical concerns  Assist level: Minimal Assistance - Patient > 75% Assistive device: Walker-rolling    Walk 10 feet on uneven surface  activity   Assist Walk 10 feet on uneven surfaces activity did not occur: Safety/medical concerns         Wheelchair  Assist Will patient use wheelchair at discharge?: No      Wheelchair assist level: Supervision/Verbal cueing Max wheelchair distance: 174ft    Wheelchair 50 feet with 2 turns activity    Assist            Wheelchair 150 feet activity     Assist            Medical Problem List and Plan: 1.Decreased functional abilitysecondary to seizures thought to  be secondary to uncontrolled hypertension/PRESversus hypoglycemia  -Continue CIR therapies including PT, OT  2. DVT Prophylaxis/Anticoagulation: Subcutaneous heparin. Monitor for any bleeding episodes 3. Pain Management:Oxycodone as needed 4. Mood:Provide emotional support 5. Neuropsych: This patientis ?fullycapable of making decisions on herown behalf. 6. Skin/Wound Care:Routine skin checks, encourage appropriate nutrition 7. Fluids/Electrolytes/Nutrition:Routine in and outs  8.Seizure disorder. Keppra 500 mg twice daily 9.Hypertension. Norvasc 10 mg daily, Coreg 25 mg twice daily, hydralazine 100 mg every 8 hours, Cardura 2 mg  Fair control at present 10.Diabetes mellitus with peripheral neuropathy. Hemoglobin A1c 12.5. SSI.  Patient on Tresiba 60 units daily prior to admission.Resume as needed. Diabetic teaching  Tresiba 10 nightly started on 10/24  Sl elevation still, increase tresiba to 14u tonight 11.CKD stage III. Monitor urine output  Creatinine 1.95 on 10/23  Labs ordered for Monday  Encourage fluids 12.Asthma. Continue nebulizers as directed. 13.Hyperlipidemia. Lipitor 14.  Hypoalbuminemia  Supplement initiated on 10/23 15.  Acute on chronic anemia  Hemoglobin 8.6 on 10/23  Labs ordered for Monday  Continue to monitor 16.  Morbid obesity  Encourage weight loss 17.  Candidiasis  Diflucan x1 ordered on 10/24 18. Pneumonia  Chest x-ray reviewed- LLL infiltrate with effusion  Wean supplemental oxygen as tolerated  Lasix 40 daily started on 10/24  Levaquin started on 10/24  Pt remains in 90's on room air  Rx nasal congestion/cough   -afrin, mucinex, flonase   LOS: 4 days A FACE TO FACE EVALUATION WAS PERFORMED  Meredith Staggers 04/06/2018, 10:35 AM

## 2018-04-06 NOTE — Progress Notes (Signed)
Physical Therapy Session Note  Patient Details  Name: Christina Orozco MRN: [id number] Date of Birth: Oct 25, 1970  Today's Date: 04/06/2018 PT Individual Time: 1445-1530 PT Individual Time Calculation (min): 45 min   Short Term Goals: Week 1:  PT Short Term Goal 1 (Week 1): Pt will ambulate x 150 ft with LRAD and SBA PT Short Term Goal 2 (Week 1): Pt will maintain dynamic standing balance with LRAD and CGA consistently PT Short Term Goal 3 (Week 1): Pt will perform transfers with SBA consistently  Skilled Therapeutic Interventions/Progress Updates:   Pt asleep in supine and initially difficult to arouse. Pt agreeable to therapy w/ max encouragement, denies pain but reports ongoing congestion and fatigue. Requesting to toilet. Ambulated to/from toilet w/ CGA and performed toilet transfer w/ CGA and verbal cues for sequencing of pericare and LE garment management. Washed hands at sink w/ supervision. Total assist w/c transport to therapy gym for time management. Performed dynamic standing balance tasks w/o UE support including ball toss and horseshoe toss w/ min assist. Emphasis on anticipatory postural control and hip balance strategies. Pt asking to not perform standing activity 2/2 fatigue. Transitioned to putting together puzzle at seated level requiring spatial awareness/organization, sequencing, problem solving, and self-correcting. Min verbal cues for completion and attention to task. Pt unwilling to participate further in therapy 2/2 fatigue, agreeable to ambulate back to room and then end session. Ambulated 150' w/ CGA using RW. Ended session in supine and all needs in reach. Missed 30 min of skilled PT 2/2 fatigue/refusal.   Therapy Documentation Precautions:  Precautions Precautions: Fall Precaution Comments: seizure Restrictions Weight Bearing Restrictions: No General: PT Amount of Missed Time (min): 30 Minutes PT Missed Treatment Reason: Patient unwilling to participate;Patient  fatigue Vital Signs: Therapy Vitals Temp: 98.8 F (37.1 C) Pulse Rate: 84 Resp: 14 BP: (!) 145/78 Patient Position (if appropriate): Lying Oxygen Therapy SpO2: 96 % O2 Device: Room Air  Therapy/Group: Individual Therapy  Helaman Mecca Clent Demark 04/06/2018, 5:54 PM

## 2018-04-06 NOTE — Progress Notes (Signed)
Pt refused oral temp; axillary temp collected with reading of 100.0 F; pt refused rectal temp; tylenol administered; will continue to monitor

## 2018-04-06 NOTE — Progress Notes (Signed)
Speech Language Pathology Daily Session Note  Patient Details  Name: Christina Orozco MRN: [id number] Date of Birth: 04/15/1971  Today's Date: 04/06/2018 SLP Individual Time: 0800-0830 SLP Individual Time Calculation (min): 30 min  Short Term Goals: Week 1: SLP Short Term Goal 1 (Week 1): STG=LTG due to ELOS  Skilled Therapeutic Interventions:  Skilled treatment session focused on cognition goals. SLP facilitated session by providing education on why pt was in hospital - helpful strategy is to prompt pt for brief answers "Seizures."  To decrease verbose perseveration on self-directed comments, SLP facilitated by providing task for pt to locate items within grocery store ad. Pt also able to alternate between activities of grocery store ad and eating breakfast. SLP gave pt one item at a time to locate in ad. Pt successful. Then increased to question cues "is there any canned fruit on sale." Pt able to recall information from email this week stating that canned fruit was on sale and able to recall sale price from memory as well as locate item in store ad. Given direct question cues (with comment "tell me in 5 words or less") pt able to describe physical deficits with legs. With Min A cues from SLP pt able to increase vocal intensity during simple conversation to achieve ~ >90% intelligibility. Pt was left upright in bed, consuming breakfast, bed alarm on and all needs within reach. Continue per current plan of care.      Pain Pain Assessment Pain Scale: 0-10 Pain Score: 0-No pain  Therapy/Group: Individual Therapy  Jhordyn Hoopingarner 04/06/2018, 8:23 AM

## 2018-04-06 NOTE — Progress Notes (Signed)
Occupational Therapy Session Note  Patient Details  Name: Christina Orozco MRN: [id number] Date of Birth: 07-Mar-1971  Today's Date: 04/06/2018 OT Individual Time: 2111-7356 OT Individual Time Calculation (min): 87 min   Short Term Goals: Week 1:  OT Short Term Goal 1 (Week 1): Pt will maintain standing balance during LB dressing with CGA.  OT Short Term Goal 2 (Week 1): Pt will perform toilet/BSC transfer with CGA and LRAD.  OT Short Term Goal 3 (Week 1): Pt will perform 3/3 steps of LB dressing.  OT Short Term Goal 4 (Week 1): Pt will require no more than 1 safety cue during ADL task completion.   Skilled Therapeutic Interventions/Progress Updates:    Pt greeted in bed with RT, just finishing up breathing treatment. No c/o pain, and pt declined shower. Total A for Teds and gripper socks, with pt refusing to attempt herself due to fatigue. Started session with ambulatory transfer to toilet using RW with steady assist due to small lateral LOBs. Supervision for hygiene post continent void of bladder. She doffed soiled LB garments with steady assist for dynamic balance, and then donned clean underwear/pants sit<stand with RW. Pt ambulated to sink to wash hands and brush teeth in standing, and then transferred to w/c. Educated pt to be mindful of eccentric control while lowering. Escorted pt to outdoor setting to improve affect. She ambulated with steady assist and RW 2.5 laps around the fountain with cues to widen BOS for improving steadiness. 3 rest periods required. Pt transferred on/off community bench and chair during ambulation. Also guided pt through gentle yoga-based UB stretches before proceeding with UE exercises. Using 2# bar, she completed bicep curls, forward presses, overhead presses, forward/backward rows, and straight arm raises x10 reps 1 set with instruction. We discussed her feelings of sadness, with pt becoming teary, reporting "I'll never be what I once was." Provided pt with  emotional support and encouragement at this time, which she appeared receptive to. Also advised family to bring in comfort sleep items to improve sleep quality. Pt reports sleeping at the hospital is very challenging for her. At end of session pt was returned to room via w/c and left with family.    Therapy Documentation Precautions:  Precautions Precautions: Fall Precaution Comments: seizure Restrictions Weight Bearing Restrictions: No  Vital Signs: Oxygen Therapy SpO2: 93 % O2 Device: Room Air Pain: Pt denied pain during session    ADL: ADL Upper Body Bathing: Minimal cueing Where Assessed-Upper Body Bathing: Shower Lower Body Bathing: Minimal assistance Where Assessed-Lower Body Bathing: Shower Upper Body Dressing: Contact guard Where Assessed-Upper Body Dressing: Wheelchair Lower Body Dressing: Minimal assistance, Other (Comment)(donning briefs only this session) Where Assessed-Lower Body Dressing: Teaching laboratory technician: Minimal assistance Social research officer, government Method: Radiographer, therapeutic: Radio broadcast assistant, Grab bars :     Therapy/Group: Individual Therapy  Kahlia Lagunes A Kaleeah Gingerich 04/06/2018, 12:30 PM

## 2018-04-07 ENCOUNTER — Inpatient Hospital Stay (HOSPITAL_COMMUNITY): Payer: BLUE CROSS/BLUE SHIELD

## 2018-04-07 LAB — GLUCOSE, CAPILLARY
GLUCOSE-CAPILLARY: 151 mg/dL — AB (ref 70–99)
GLUCOSE-CAPILLARY: 166 mg/dL — AB (ref 70–99)
Glucose-Capillary: 178 mg/dL — ABNORMAL HIGH (ref 70–99)

## 2018-04-07 MED ORDER — ALBUTEROL SULFATE (2.5 MG/3ML) 0.083% IN NEBU
2.5000 mg | INHALATION_SOLUTION | Freq: Four times a day (QID) | RESPIRATORY_TRACT | Status: DC | PRN
  Administered 2018-04-09: 2.5 mg via RESPIRATORY_TRACT
  Filled 2018-04-07: qty 3

## 2018-04-07 MED ORDER — ALBUTEROL SULFATE (2.5 MG/3ML) 0.083% IN NEBU
2.5000 mg | INHALATION_SOLUTION | Freq: Four times a day (QID) | RESPIRATORY_TRACT | Status: DC
  Administered 2018-04-07 – 2018-04-11 (×16): 2.5 mg via RESPIRATORY_TRACT
  Filled 2018-04-07 (×15): qty 3

## 2018-04-07 NOTE — Progress Notes (Signed)
Physical Therapy Session Note  Patient Details  Name: Christina Orozco MRN: [id number] Date of Birth: 20-May-1971  Today's Date: 04/07/2018 PT Individual Time: 1400-1440 PT Individual Time Calculation (min): 40 min  and Today's Date: 04/07/2018 PT Missed Time: 20 Minutes Missed Time Reason: Patient fatigue;Other (Comment)(dizziness)  Short Term Goals: Week 1:  PT Short Term Goal 1 (Week 1): Pt will ambulate x 150 ft with LRAD and SBA PT Short Term Goal 2 (Week 1): Pt will maintain dynamic standing balance with LRAD and CGA consistently PT Short Term Goal 3 (Week 1): Pt will perform transfers with SBA consistently  Skilled Therapeutic Interventions/Progress Updates:    Pt supine in bed upon PT arrival, agreeable to therapy tx and denies pain. Pt transferred to sitting EOB with supervision. Pt seated EOB while therapist assisted to don teds and socks. Pt performed stand pivot to w/c with min assist and transported to the gym. Pt ambulated x 40 ft with RW and min assist, pt reports she suddenly needs to sit because she feels dizzy. BP monitored in sitting 128/82 and then again in standing 109/64 with complaints of dizziness, pt appears to demonstrate orthostatic hypotension this session. Pt reports she has been feeling dizzy all day, requesting to lay back down. Pt transported back to room and transferred to bed, left supine with needs in reach and bed alarm set. RN made aware of pt's drop in BP this session with upright activity. Pt missed 20 minutes of skilled therapy tx this session secondary to dizziness.   Therapy Documentation Precautions:  Precautions Precautions: Fall Precaution Comments: seizure Restrictions Weight Bearing Restrictions: No   Therapy/Group: Individual Therapy  Netta Corrigan, PT, DPT  04/07/2018, 12:04 PM

## 2018-04-07 NOTE — Progress Notes (Signed)
Patient educated re: congestion medications(nasal sprays) she claims she remembered the medications that was given to her yesterday but waiting for today's dose. Patient did her nasal sprays two of them  with RN supervision. Educated the importance of hydration as well.

## 2018-04-07 NOTE — Progress Notes (Addendum)
Patient to bathroom per rolling walker and had a  dizzy spell after getting up from the commode.  Patient claims " I got dizzy." RN helped patient sit on the commode and  called for help.  Wheeled pt.  back in bed  CBG done 151. Vital signs done 137/73, 79, 99.1, 98% . Dr. Naaman Plummer notified.

## 2018-04-07 NOTE — Progress Notes (Signed)
Educated on [employer] use

## 2018-04-07 NOTE — Progress Notes (Addendum)
PT came to RN saying that patient got dizzy during therapy BP  sitting 128/82 and standing 109/64. No other complaints noted. MD aware called earlier of the same symptom. Advised patient to drink fluids. Continued to monitor.

## 2018-04-07 NOTE — Progress Notes (Signed)
PHYSICAL MEDICINE & REHABILITATION PROGRESS NOTE  Subjective/Complaints: Still with complaints of congestion. Didn't sleep well. States that nurse didn't give her anything for congestion  ROS: Patient denies fever, rash, sore throat, blurred vision, nausea, vomiting, diarrhea, cough, shortness of breath or chest pain, joint or back pain, headache, or mood change.   Objective: Vital Signs: Blood pressure (!) 145/74, pulse 87, temperature 99.7 F (37.6 C), temperature source Oral, resp. rate 16, height 5\' 3"  (1.6 m), weight 99.3 kg, last menstrual period 03/12/2018, SpO2 98 %. No results found. No results for input(s): WBC, HGB, HCT, PLT in the last 72 hours. No results for input(s): NA, K, CL, CO2, GLUCOSE, BUN, CREATININE, CALCIUM in the last 72 hours.  Physical Exam: BP (!) 145/74   Pulse 87   Temp 99.7 F (37.6 C) (Oral)   Resp 16   Ht 5\' 3"  (1.6 m)   Wt 99.3 kg   LMP 03/12/2018   SpO2 98%   BMI 38.79 kg/m  Constitutional: No distress . Vital signs reviewed. HEENT: EOMI, oral membranes moist, congested Neck: supple Cardiovascular: RRR without murmur. No JVD    Respiratory: CTA Bilaterally without wheezes or rales. Normal effort,    GI: BS +, non-tender, non-distended  Musculoskeletal: She exhibitsmild edema LE.  Neurological:Patient is alert Follows simple commands. Motor: 5/5 stable Psychiatric:flat, irritable  Assessment/Plan: 1. Functional deficits secondary to seizures which require 3+ hours per day of interdisciplinary therapy in a comprehensive inpatient rehab setting.  Physiatrist is providing close team supervision and 24 hour management of active medical problems listed below.  Physiatrist and rehab team continue to assess barriers to discharge/monitor patient progress toward functional and medical goals  Care Tool:  Bathing  Bathing activity did not occur: (declined) Body parts bathed by patient: Right arm, Left arm, Chest, Abdomen, Front  perineal area, Buttocks, Right upper leg, Face, Left lower leg, Right lower leg, Left upper leg         Bathing assist Assist Level: Contact Guard/Touching assist     Upper Body Dressing/Undressing Upper body dressing   What is the patient wearing?: Pull over shirt    Upper body assist Assist Level: Set up assist    Lower Body Dressing/Undressing Lower body dressing      What is the patient wearing?: Pants, Underwear/pull up     Lower body assist Assist for lower body dressing: Contact Guard/Touching assist     Toileting Toileting    Toileting assist Assist for toileting: Contact Guard/Touching assist     Transfers Chair/bed transfer  Transfers assist     Chair/bed transfer assist level: Contact Guard/Touching assist     Locomotion Ambulation   Ambulation assist      Assist level: Contact Guard/Touching assist Assistive device: Walker-rolling Max distance: 150'   Walk 10 feet activity   Assist     Assist level: Contact Guard/Touching assist Assistive device: Walker-rolling   Walk 50 feet activity   Assist    Assist level: Contact Guard/Touching assist Assistive device: Walker-rolling    Walk 150 feet activity   Assist Walk 150 feet activity did not occur: Safety/medical concerns  Assist level: Contact Guard/Touching assist Assistive device: Walker-rolling    Walk 10 feet on uneven surface  activity   Assist Walk 10 feet on uneven surfaces activity did not occur: Safety/medical concerns         Wheelchair     Assist Will patient use wheelchair at discharge?: No      Wheelchair  assist level: Supervision/Verbal cueing Max wheelchair distance: 132ft    Wheelchair 50 feet with 2 turns activity    Assist            Wheelchair 150 feet activity     Assist            Medical Problem List and Plan: 1.Decreased functional abilitysecondary to seizures thought to be secondary to uncontrolled  hypertension/PRESversus hypoglycemia  -Continue CIR therapies including PT, OT  2. DVT Prophylaxis/Anticoagulation: Subcutaneous heparin. Monitor for any bleeding episodes 3. Pain Management:Oxycodone as needed 4. Mood:Provide emotional support 5. Neuropsych: This patientis ?fullycapable of making decisions on herown behalf. 6. Skin/Wound Care:Routine skin checks, encourage appropriate nutrition 7. Fluids/Electrolytes/Nutrition:Routine in and outs  8.Seizure disorder. Keppra 500 mg twice daily 9.Hypertension. Norvasc 10 mg daily, Coreg 25 mg twice daily, hydralazine 100 mg every 8 hours, Cardura 2 mg  Fair control at present 10.Diabetes mellitus with peripheral neuropathy. Hemoglobin A1c 12.5. SSI.  Patient on Tresiba 60 units daily prior to admission.Resume as needed. Diabetic teaching  Tresiba 10 nightly started on 10/24  Sl elevation still, increased tresiba to 14u 10/26  -observe today 11.CKD stage III. Monitor urine output  Creatinine 1.95 on 10/23  Labs ordered for Monday  Encourage fluids 12.Asthma. Continue nebulizers as directed. 13.Hyperlipidemia. Lipitor 14.  Hypoalbuminemia  Supplement initiated on 10/23 15.  Acute on chronic anemia  Hemoglobin 8.6 on 10/23  Labs ordered for Monday  Continue to monitor 16.  Morbid obesity  Encourage weight loss 17.  Candidiasis  Diflucan x1 ordered on 10/24 18. Pneumonia  Chest x-ray reviewed- LLL infiltrate with effusion  Wean supplemental oxygen as tolerated  Lasix 40 daily started on 10/24  Levaquin started on 10/24  Pt remains in 90's on room air, dose have low grade temp  Rx nasal congestion/cough   -continue afrin, mucinex, flonase   -asked RN to review medications she's receiving   -encourage IS    LOS: 5 days A FACE TO FACE EVALUATION WAS PERFORMED  Meredith Staggers 04/07/2018, 10:30 AM

## 2018-04-08 ENCOUNTER — Inpatient Hospital Stay (HOSPITAL_COMMUNITY): Payer: BLUE CROSS/BLUE SHIELD | Admitting: Speech Pathology

## 2018-04-08 ENCOUNTER — Inpatient Hospital Stay (HOSPITAL_COMMUNITY): Payer: BLUE CROSS/BLUE SHIELD | Admitting: Physical Therapy

## 2018-04-08 ENCOUNTER — Encounter (HOSPITAL_COMMUNITY): Payer: BLUE CROSS/BLUE SHIELD | Admitting: Psychology

## 2018-04-08 ENCOUNTER — Encounter (HOSPITAL_COMMUNITY): Payer: Self-pay

## 2018-04-08 ENCOUNTER — Encounter: Payer: Self-pay | Admitting: Cardiology

## 2018-04-08 ENCOUNTER — Inpatient Hospital Stay (HOSPITAL_COMMUNITY): Payer: BLUE CROSS/BLUE SHIELD | Admitting: Occupational Therapy

## 2018-04-08 DIAGNOSIS — R0981 Nasal congestion: Secondary | ICD-10-CM

## 2018-04-08 LAB — GLUCOSE, CAPILLARY
GLUCOSE-CAPILLARY: 120 mg/dL — AB (ref 70–99)
GLUCOSE-CAPILLARY: 218 mg/dL — AB (ref 70–99)
GLUCOSE-CAPILLARY: 196 mg/dL — AB (ref 70–99)
Glucose-Capillary: 173 mg/dL — ABNORMAL HIGH (ref 70–99)

## 2018-04-08 LAB — BASIC METABOLIC PANEL
Anion gap: 6 (ref 5–15)
BUN: 35 mg/dL — AB (ref 6–20)
CALCIUM: 8.2 mg/dL — AB (ref 8.9–10.3)
CO2: 21 mmol/L — ABNORMAL LOW (ref 22–32)
CREATININE: 2.35 mg/dL — AB (ref 0.44–1.00)
Chloride: 110 mmol/L (ref 98–111)
GFR calc non Af Amer: 24 mL/min — ABNORMAL LOW (ref >60)
GFR, EST AFRICAN AMERICAN: 28 mL/min — AB (ref >60)
Glucose, Bld: 134 mg/dL — ABNORMAL HIGH (ref 70–99)
Potassium: 3.3 mmol/L — ABNORMAL LOW (ref 3.5–5.1)
Sodium: 137 mmol/L (ref 135–145)

## 2018-04-08 LAB — CBC WITH DIFFERENTIAL/PLATELET
Abs Immature Granulocytes: 0.02 10*3/uL (ref 0.00–0.07)
BASOS ABS: 0 10*3/uL (ref 0.0–0.1)
Basophils Relative: 0 %
EOS ABS: 0.1 10*3/uL (ref 0.0–0.5)
EOS PCT: 2 %
HCT: 25.1 % — ABNORMAL LOW (ref 36.0–46.0)
HEMOGLOBIN: 8 g/dL — AB (ref 12.0–15.0)
Immature Granulocytes: 0 %
LYMPHS ABS: 2.8 10*3/uL (ref 0.7–4.0)
LYMPHS PCT: 40 %
MCH: 28.5 pg (ref 26.0–34.0)
MCHC: 31.9 g/dL (ref 30.0–36.0)
MCV: 89.3 fL (ref 80.0–100.0)
Monocytes Absolute: 0.6 10*3/uL (ref 0.1–1.0)
Monocytes Relative: 8 %
NRBC: 0 % (ref 0.0–0.2)
Neutro Abs: 3.4 10*3/uL (ref 1.7–7.7)
Neutrophils Relative %: 50 %
Platelets: 323 10*3/uL (ref 150–400)
RBC: 2.81 MIL/uL — AB (ref 3.87–5.11)
RDW: 14.4 % (ref 11.5–15.5)
WBC: 6.9 10*3/uL (ref 4.0–10.5)

## 2018-04-08 NOTE — Progress Notes (Signed)
Speech Language Pathology Daily Session Note  Patient Details  Name: Christina Orozco MRN: [id number] Date of Birth: May 04, 1971  Today's Date: 04/08/2018 SLP Individual Time: 0930-1030 SLP Individual Time Calculation (min): 60 min  Short Term Goals: Week 1: SLP Short Term Goal 1 (Week 1): STG=LTG due to ELOS  Skilled Therapeutic Interventions:  Skilled treatment session focused on cognition goals. SLP facilitated session by providing complex medication management tasks. Pt completed independently. While pt has been able to complete structured complex cognitive tasks during therapy, she continues to appear very flat and masked by her psycho-emotional responses/thoughts towards her current life situation (most recently originating with loss of her job in 2016). She states that she has developed "masked appearance" to protect her from exposing the hurt she experiences from life-style situations that were created by loosing her job. Neuropsych will see her today. While pt is able to demonstrate cognitive ability, her current emotional state may decrease her safety at home. Information gathered during session was relayed to Neuropsych.      Pain Pain Assessment Pain Scale: 0-10 Pain Score: 0-No pain  Therapy/Group: Individual Therapy  Jernee Murtaugh 04/08/2018, 11:03 AM

## 2018-04-08 NOTE — Progress Notes (Signed)
Physical Therapy Session Note  Patient Details  Name: Christina Orozco MRN: [id number] Date of Birth: 08-06-1970  Today's Date: 04/08/2018 PT Individual Time: 1331-1445 PT Individual Time Calculation (min): 74 min   Short Term Goals: Week 1:  PT Short Term Goal 1 (Week 1): Pt will ambulate x 150 ft with LRAD and SBA PT Short Term Goal 2 (Week 1): Pt will maintain dynamic standing balance with LRAD and CGA consistently PT Short Term Goal 3 (Week 1): Pt will perform transfers with SBA consistently  Skilled Therapeutic Interventions/Progress Updates:  Pt presented in bed agreeable to therapy. Denies pain however c/o increased dizziness/lightheadedness. Pt performed supine to sit with CGA and use of bed features. Performed stand pivot to w/c and use of RW CGA. Pt propelled w/c to rehab gym approx 179f with supervision and x 3 rest breaks due to fatigue. Participated in standing balance activities including placing clothespins on basketball hoopwith moderate challenges reaching outside BOS. Pt also performed toe taps to 4in step x 10 bilaterally with x 1 occurrence of increased sway to L side when tapping with R foot. Ambulation 580fwith RW CGA with w/c follow. Pt noted to have LOB to L with pt able to reach to wall rail for support. Pt stating no warning just increased dizziness. BP checked 147/4 HR 80. Participated static balance on [employer], performed feet together, feel apart, able to tolerate minimal perbutations without LOB and without holding onto RW. Pt then participated in horseshoes on compliant and non-compliant surface with moderate reaching outside BOS. Pt transported to day room and participated in NuStep L2 x 6 min for endurance and global strengthening. Pt was able to perform without rest breaks. Pt transported partially back to room and pt propelled remaining distance approx 12571fPt performed stand pivot w/c to bed CGA and returned to bed. Pt left in bed at end of session with bed alarm  on, call bell within reach and needs met.       Therapy Documentation Precautions:  Precautions Precautions: Fall Precaution Comments: seizure Restrictions Weight Bearing Restrictions: No General:   Vital Signs: Therapy Vitals Pulse Rate: 82 Resp: 20 BP: (!) 154/82 Patient Position (if appropriate): Lying Oxygen Therapy SpO2: 96 % O2 Device: Room Air Pain:   Mobility:   Locomotion :    Trunk/Postural Assessment :    Balance:   Exercises:   Other Treatments:      Therapy/Group: Individual Therapy  Ayomikun Starling 04/08/2018, 4:28 PM
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
## 2018-04-08 NOTE — Progress Notes (Signed)
Patient is requesting congestion medication at this time. I told patient that she would get it with her morning meds as we are changing shifts right now, and I will pass the request to 1st shift RN.

## 2018-04-08 NOTE — Progress Notes (Signed)
Physical Therapy Session Note  Patient Details  Name: Christina Orozco MRN: [id number] Date of Birth: Jan 11, 1971  Today's Date: 04/08/2018 PT Individual Time: 1130-1200 PT Individual Time Calculation (min): 30 min   Short Term Goals: Week 1:  PT Short Term Goal 1 (Week 1): Pt will ambulate x 150 ft with LRAD and SBA PT Short Term Goal 2 (Week 1): Pt will maintain dynamic standing balance with LRAD and CGA consistently PT Short Term Goal 3 (Week 1): Pt will perform transfers with SBA consistently  Skilled Therapeutic Interventions/Progress Updates:    Pt received seated in bed, agreeable to participate in PT makeup minutes. No complaints of pain. Seated BP 144/75. Seated in bed to seated EOB with Supervision. Sit to stand with Supervision to RW. Pt has no onset of orthostatic symptoms in standing, BP 120/70. Static standing balance with one UE support on RW and close Supervision. Pt reports this BP is low for her and with mobility she usually has onset of dizziness, pt requests to sit back down. Sitting balance EOB with distant Supervision while pt sets up her lunch. Pt left seated EOB with needs in reach, bed alarm in place.  Therapy Documentation Precautions:  Precautions Precautions: Fall Precaution Comments: seizure Restrictions Weight Bearing Restrictions: No   Therapy/Group: Individual Therapy  Excell Seltzer, PT, DPT  04/08/2018, 12:00 PM

## 2018-04-08 NOTE — Progress Notes (Signed)
Occupational Therapy Session Note  Patient Details  Name: Christina Orozco MRN: [id number] Date of Birth: 08-Dec-1970  Today's Date: 04/08/2018 OT Individual Time: 7939-0300 OT Individual Time Calculation (min): 58 min    Short Term Goals: Week 1:  OT Short Term Goal 1 (Week 1): Pt will maintain standing balance during LB dressing with CGA.  OT Short Term Goal 2 (Week 1): Pt will perform toilet/BSC transfer with CGA and LRAD.  OT Short Term Goal 3 (Week 1): Pt will perform 3/3 steps of LB dressing.  OT Short Term Goal 4 (Week 1): Pt will require no more than 1 safety cue during ADL task completion.   Skilled Therapeutic Interventions/Progress Updates:    Pt seen this session for ADL training with a focus on activity tolerance, balance, coordination.  Pt received in bed finishing breakfast.  She transitioned to EOB with S and then to w/c squat pivot with S.  Used w/c to transfer to bathroom to ensure pt would not feel dizzy. Pt transferred to toilet with RW with S.  Completed toileting with S and stood to RW.  No dizziness reported so pt ambulated to shower with close S.   Once in shower, pt used tub bench and bathed with set up from sitting.  She stood to wash bottom and needed CGA as she was leaning back. Pt stated she felt very dizzy. Sat to rest and then used a squat pivot to w/c with CGA.   Blood pressure assessed and pt stated it was low for her.  Dressed from the w/c with A from therapist to don pants and socks over feet as pt was continuing to feel dizzy.  Pt did stand with CGA to pull clothing over hips.  Pt has 2 more therapy sessions back to back but she requested to lay down.  Pt transferred to bed and pt resting on her side.  Call light in reach.   Therapy Documentation Precautions:  Precautions Precautions: Fall Precaution Comments: seizure Restrictions Weight Bearing Restrictions: No    Vital Signs: Therapy Vitals Pulse Rate: 82 BP: 132/68 Patient Position (if  appropriate): Sitting Oxygen Therapy SpO2: 96 % O2 Device: Room Air Pain: Pain Assessment Pain Score: 0-No pain   Therapy/Group: Individual Therapy  Eldar Robitaille 04/08/2018, 9:53 AM

## 2018-04-08 NOTE — Progress Notes (Addendum)
[facility] PHYSICAL MEDICINE & REHABILITATION PROGRESS NOTE  Subjective/Complaints: Patient seen laying in bed this AM.  She states she slept fairly and had a fair weekend.  Keeps eyes closed throughout the exam. She notes breathing has improved, but now has congestion.   ROS: Denies CP, SOB, N/V/D  Objective: Vital Signs: Blood pressure (!) 149/78, pulse 82, temperature 98.8 F (37.1 C), resp. rate 12, height 5\' 3"  (1.6 m), weight 99.3 kg, last menstrual period 03/12/2018, SpO2 96 %. No results found. No results for input(s): WBC, HGB, HCT, PLT in the last 72 hours. No results for input(s): NA, K, CL, CO2, GLUCOSE, BUN, CREATININE, CALCIUM in the last 72 hours.  Physical Exam: BP (!) 149/78   Pulse 82   Temp 98.8 F (37.1 C)   Resp 12   Ht 5\' 3"  (1.6 m)   Wt 99.3 kg   LMP 03/12/2018   SpO2 96%   BMI 38.79 kg/m  Constitutional: No distress . Vital signs reviewed. HENT: Normocephalic.  Atraumatic. Eyes: Keeps eyes closed. No discharge. Cardiovascular: RRR. No JVD. Respiratory: CTA Bilaterally. Normal effort. GI: BS +. Non-distended. Musc: No edema or tenderness in extremities. Musculoskeletal: She exhibits edema LE.  Neurological:Patient is alert Follows simple commands. Motor: 5/5 stable Psychiatric:Flat, irritable  Assessment/Plan: 1. Functional deficits secondary to seizures which require 3+ hours per day of interdisciplinary therapy in a comprehensive inpatient rehab setting.  Physiatrist is providing close team supervision and 24 hour management of active medical problems listed below.  Physiatrist and rehab team continue to assess barriers to discharge/monitor patient progress toward functional and medical goals  Care Tool:  Bathing  Bathing activity did not occur: (declined) Body parts bathed by patient: Right arm, Left arm, Chest, Abdomen, Front perineal area, Buttocks, Right upper leg, Face, Left lower leg, Right lower leg, Left upper leg          Bathing assist Assist Level: Contact Guard/Touching assist     Upper Body Dressing/Undressing Upper body dressing   What is the patient wearing?: Pull over shirt    Upper body assist Assist Level: Set up assist    Lower Body Dressing/Undressing Lower body dressing      What is the patient wearing?: Pants, Underwear/pull up     Lower body assist Assist for lower body dressing: Contact Guard/Touching assist     Toileting Toileting    Toileting assist Assist for toileting: Contact Guard/Touching assist     Transfers Chair/bed transfer  Transfers assist     Chair/bed transfer assist level: Contact Guard/Touching assist     Locomotion Ambulation   Ambulation assist      Assist level: Contact Guard/Touching assist Assistive device: Walker-rolling Max distance: 40 ft   Walk 10 feet activity   Assist     Assist level: Contact Guard/Touching assist Assistive device: Walker-rolling   Walk 50 feet activity   Assist    Assist level: Contact Guard/Touching assist Assistive device: Walker-rolling    Walk 150 feet activity   Assist Walk 150 feet activity did not occur: Safety/medical concerns  Assist level: Contact Guard/Touching assist Assistive device: Walker-rolling    Walk 10 feet on uneven surface  activity   Assist Walk 10 feet on uneven surfaces activity did not occur: Safety/medical concerns         Wheelchair     Assist Will patient use wheelchair at discharge?: No      Wheelchair assist level: Supervision/Verbal cueing Max wheelchair distance: 173ft    Wheelchair 50  feet with 2 turns activity    Assist            Wheelchair 150 feet activity     Assist            Medical Problem List and Plan: 1.Decreased functional abilitysecondary to seizures thought to be secondary to uncontrolled hypertension/PRESversus hypoglycemia  Continue CIR  2. DVT Prophylaxis/Anticoagulation: Subcutaneous heparin.  Monitor for any bleeding episodes 3. Pain Management:Oxycodone as needed 4. Mood:Provide emotional support 5. Neuropsych: This patientis ?fullycapable of making decisions on herown behalf. 6. Skin/Wound Care:Routine skin checks, encourage appropriate nutrition 7. Fluids/Electrolytes/Nutrition:Routine in and outs  8.Seizure disorder. Keppra 500 mg twice daily 9.Hypertension. Norvasc 10 mg daily, Coreg 25 mg twice daily, hydralazine 100 mg every 8 hours  Cardura increased to 4 mg on 10/26  ?Trending up on 10/28 10.Diabetes mellitus with peripheral neuropathy. Hemoglobin A1c 12.5. SSI.  Patient on Tresiba 60 units daily prior to admission.Resume as needed. Diabetic teaching  Tresiba 10 nightly started on 10/24, increased to 14u 10/26  Elevated on 10/28  Cont to monitor 11.CKD stage III. Monitor urine output  Creatinine 1.95 on 10/23  Labs pending  Encourage fluids 12.Asthma. Continue nebulizers as directed. 13.Hyperlipidemia. Lipitor 14.  Hypoalbuminemia  Supplement initiated on 10/23 15.  Acute on chronic anemia  Hemoglobin 8.6 on 10/23  Labs pending  Continue to monitor 16.  Morbid obesity  Encourage weight loss 17.  Candidiasis  Diflucan x1 ordered on 10/24 18. Pneumonia  Chest x-ray reviewed- LLL infiltrate with effusion  Weaned supplemental oxygen  Lasix 40 daily started on 10/24  Levaquin started on 10/24 19. Nasal congestion  Continue mucinex, flonase  Afrin d/ced on 10/28  Encourage IS  LOS: 6 days A FACE TO FACE EVALUATION WAS PERFORMED  Christina Orozco Christina Orozco 04/08/2018, 9:02 AM

## 2018-04-09 ENCOUNTER — Inpatient Hospital Stay (HOSPITAL_COMMUNITY): Payer: BLUE CROSS/BLUE SHIELD | Admitting: Physical Therapy

## 2018-04-09 ENCOUNTER — Inpatient Hospital Stay (HOSPITAL_COMMUNITY): Payer: BLUE CROSS/BLUE SHIELD | Admitting: Speech Pathology

## 2018-04-09 ENCOUNTER — Inpatient Hospital Stay (HOSPITAL_COMMUNITY): Payer: BLUE CROSS/BLUE SHIELD | Admitting: Occupational Therapy

## 2018-04-09 LAB — GLUCOSE, CAPILLARY
GLUCOSE-CAPILLARY: 190 mg/dL — AB (ref 70–99)
Glucose-Capillary: 186 mg/dL — ABNORMAL HIGH (ref 70–99)
Glucose-Capillary: 126 mg/dL — ABNORMAL HIGH (ref 70–99)
Glucose-Capillary: 176 mg/dL — ABNORMAL HIGH (ref 70–99)

## 2018-04-09 MED ORDER — INSULIN GLARGINE 100 UNIT/ML ~~LOC~~ SOLN
20.0000 [IU] | Freq: Every day | SUBCUTANEOUS | Status: DC
  Administered 2018-04-09 – 2018-04-12 (×4): 20 [IU] via SUBCUTANEOUS
  Filled 2018-04-09 (×4): qty 0.2

## 2018-04-09 MED ORDER — LORATADINE 10 MG PO TABS
10.0000 mg | ORAL_TABLET | Freq: Every day | ORAL | Status: DC
  Administered 2018-04-09 – 2018-04-13 (×5): 10 mg via ORAL
  Filled 2018-04-09 (×5): qty 1

## 2018-04-09 MED ORDER — SODIUM CHLORIDE 0.9 % IV SOLN
INTRAVENOUS | Status: AC
  Administered 2018-04-09 – 2018-04-10 (×2): via INTRAVENOUS

## 2018-04-09 MED ORDER — GUAIFENESIN 100 MG/5ML PO SOLN
5.0000 mL | ORAL | Status: DC | PRN
  Administered 2018-04-09 – 2018-04-13 (×11): 100 mg via ORAL
  Filled 2018-04-09 (×11): qty 10

## 2018-04-09 NOTE — Progress Notes (Signed)
[place] PHYSICAL MEDICINE & REHABILITATION PROGRESS NOTE  Subjective/Complaints: Patient seen laying in bed this morning.  She states she did not sleep well overnight due to congestion.  She has questions about congestion, fatigue, breathing treatments.  She is more interactive this morning.  ROS: +Nasal congestion.  Denies CP, SOB, N/V/D  Objective: Vital Signs: Blood pressure (!) 143/78, pulse 86, temperature 98.4 F (36.9 C), resp. rate 18, height 5\' 3"  (1.6 m), weight 99.3 kg, last menstrual period 03/12/2018, SpO2 98 %. No results found. Recent Labs    04/08/18 0822  WBC 6.9  HGB 8.0*  HCT 25.1*  PLT 323   Recent Labs    04/08/18 0822  NA 137  K 3.3*  CL 110  CO2 21*  GLUCOSE 134*  BUN 35*  CREATININE 2.35*  CALCIUM 8.2*    Physical Exam: BP (!) 143/78   Pulse 86   Temp 98.4 F (36.9 C)   Resp 18   Ht 5\' 3"  (1.6 m)   Wt 99.3 kg   LMP 03/12/2018   SpO2 98%   BMI 38.79 kg/m  Constitutional: No distress . Vital signs reviewed. HENT: Normocephalic.  Atraumatic. Eyes: EOMI. No discharge. Cardiovascular: RRR.  No JVD. Respiratory: CTA bilaterally.  Normal effort. Upper respiratory congestion GI: BS +. Non-distended. Musc: No edema or tenderness in extremities. Musculoskeletal: She exhibits edema LE.  Neurological:Patient is alert Follows simple commands. Motor: 5/5 stable Psychiatric:Flat  Assessment/Plan: 1. Functional deficits secondary to seizures which require 3+ hours per day of interdisciplinary therapy in a comprehensive inpatient rehab setting.  Physiatrist is providing close team supervision and 24 hour management of active medical problems listed below.  Physiatrist and rehab team continue to assess barriers to discharge/monitor patient progress toward functional and medical goals  Care Tool:  Bathing  Bathing activity did not occur: (declined) Body parts bathed by patient: Right arm, Left arm, Chest, Abdomen, Front perineal area,  Buttocks, Right upper leg, Face, Left lower leg, Right lower leg, Left upper leg         Bathing assist Assist Level: Contact Guard/Touching assist     Upper Body Dressing/Undressing Upper body dressing   What is the patient wearing?: Pull over shirt    Upper body assist Assist Level: Set up assist    Lower Body Dressing/Undressing Lower body dressing      What is the patient wearing?: Pants, Underwear/pull up     Lower body assist Assist for lower body dressing: Minimal Assistance - Patient > 75%     Toileting Toileting    Toileting assist Assist for toileting: Contact Guard/Touching assist     Transfers Chair/bed transfer  Transfers assist     Chair/bed transfer assist level: Contact Guard/Touching assist     Locomotion Ambulation   Ambulation assist      Assist level: Contact Guard/Touching assist Assistive device: Walker-rolling Max distance: 40 ft   Walk 10 feet activity   Assist     Assist level: Contact Guard/Touching assist Assistive device: Walker-rolling   Walk 50 feet activity   Assist    Assist level: Contact Guard/Touching assist Assistive device: Walker-rolling    Walk 150 feet activity   Assist Walk 150 feet activity did not occur: Safety/medical concerns  Assist level: Contact Guard/Touching assist Assistive device: Walker-rolling    Walk 10 feet on uneven surface  activity   Assist Walk 10 feet on uneven surfaces activity did not occur: Safety/medical concerns  Wheelchair     Assist Will patient use wheelchair at discharge?: No      Wheelchair assist level: Supervision/Verbal cueing Max wheelchair distance: 177ft    Wheelchair 50 feet with 2 turns activity    Assist            Wheelchair 150 feet activity     Assist            Medical Problem List and Plan: 1.Decreased functional abilitysecondary to seizures thought to be secondary to uncontrolled  hypertension/PRESversus hypoglycemia  Continue CIR  2. DVT Prophylaxis/Anticoagulation: Subcutaneous heparin. Monitor for any bleeding episodes 3. Pain Management:Oxycodone as needed 4. Mood:Provide emotional support 5. Neuropsych: This patientis ?fullycapable of making decisions on herown behalf. 6. Skin/Wound Care:Routine skin checks, encourage appropriate nutrition 7. Fluids/Electrolytes/Nutrition:Routine in and outs  8.Seizure disorder. Keppra 500 mg twice daily 9.Hypertension. Norvasc 10 mg daily, Coreg 25 mg twice daily, hydralazine 100 mg every 8 hours  Cardura increased to 4 mg on 10/26  Slightly elevated on 10/29 10.Diabetes mellitus with peripheral neuropathy. Hemoglobin A1c 12.5. SSI.  Patient on Tresiba 60 units daily prior to admission.Resume as needed. Diabetic teaching  Lantus 10 nightly started on 10/24, increased to 14u 10/26, increased to 20 units on 10/29  Cont to monitor 11.CKD stage III. Monitor urine output  Creatinine 2.35 on 10/28  Will order IVF x2 nights, echo reviewed EF to 55-60%   Labs ordered for tomorrow  Encourage fluids 12.Asthma. Continue nebulizers as directed. 13.Hyperlipidemia. Lipitor 14.  Hypoalbuminemia  Supplement initiated on 10/23 15.  Acute on chronic anemia  Hemoglobin 8.0 on 10/28  Labs ordered for tomorrow  Hemoccult ordered  Continue to monitor 16.  Morbid obesity  Encourage weight loss 17.  Candidiasis  Diflucan x1 ordered on 10/24 18. Pneumonia  Chest x-ray reviewed- LLL infiltrate with effusion  Weaned supplemental oxygen  Lasix 40 daily started on 10/24  Levaquin started on 10/24 19. Nasal congestion  Continue mucinex, flonase  Afrin d/ced on 10/28  Encourage IS  Claritin started on 10/28  LOS: 7 days A FACE TO FACE EVALUATION WAS PERFORMED  Dolores Mcgovern [name] 04/09/2018, 8:37 AM

## 2018-04-09 NOTE — Progress Notes (Signed)
Occupational Therapy Session Note  Patient Details  Name: Christina Orozco MRN: [id number] Date of Birth: 08-26-1970   Today's Date: 04/09/2018 OT Individual Time: 0920-1030 OT Individual Time Calculation (min): 70 min    Short Term Goals: Week 1:  OT Short Term Goal 1 (Week 1): Pt will maintain standing balance during LB dressing with CGA.  OT Short Term Goal 2 (Week 1): Pt will perform toilet/BSC transfer with CGA and LRAD.  OT Short Term Goal 3 (Week 1): Pt will perform 3/3 steps of LB dressing.  OT Short Term Goal 4 (Week 1): Pt will require no more than 1 safety cue during ADL task completion.   Skilled Therapeutic Interventions/Progress Updates:    patient is pleasant and agrees to complete therapy session.. She states that she did not sleep well last night but knows the importance of participating.  She has a significant cough t/o therapy session. MD and nsg aware.  Functional transfers:  Bed mobility S, SPT to/from bed, w/c, shower seat CG to steady ADL/shower:  Bathing CS when seated, CG in stance, UB dressing with set up, LB dressing with assist to manage brief - wearing brief today due to incontinence with coughing, dependent for footwear due to fatigue, grooming at w/c level Independent. Therapeutic exercises - standing activity with RW :  Tolerated 2 x 2 minutes without dizziness - needed to sit due to fatigue UB exercises - completed OH reach without difficulty, dizziness noted with head/UE side to side exercise. Patient did not experience any dizziness symptoms during session with self care/transfers/standing activities but UE/head/trunk side to side motion increased dizziness and after a rest break completed oculomotor saccades without head/body movement and noted significant dizziness (6 reps L/R).  Her dizziness subsided after a short break - no nausea.  She returned to bed at the end of the session with alarm on.    Therapy Documentation Precautions:   Precautions Precautions: Fall Precaution Comments: seizure Restrictions Weight Bearing Restrictions: No General:   Vital Signs: Oxygen Therapy SpO2: 95 % O2 Device: Room Air Pain: Pain Assessment Pain Scale: 0-10 Pain Score: 0-No pain   Therapy/Group: Individual Therapy  Carlos Levering 04/09/2018, 3:05 PM

## 2018-04-09 NOTE — Progress Notes (Signed)
Physical Therapy Session Note  Patient Details  Name: Christina Orozco MRN: [id number] Date of Birth: 1970-09-04  Today's Date: 04/09/2018 PT Individual Time: 1305-1400 PT Individual Time Calculation (min): 55 min   Short Term Goals: Week 1:  PT Short Term Goal 1 (Week 1): Pt will ambulate x 150 ft with LRAD and SBA PT Short Term Goal 2 (Week 1): Pt will maintain dynamic standing balance with LRAD and CGA consistently PT Short Term Goal 3 (Week 1): Pt will perform transfers with SBA consistently  Skilled Therapeutic Interventions/Progress Updates: Pt presented in bed sleeping difficult to arouse. Once aroused pt noted to have severe congestion. Pt then becoming emotional due to frustrations of current debility compounded with head cold. Provided emotional support. Pt then indicating need to use bathroom as coughing spells cause bladder leakage. Performed bed mobility with supervision with use of bed features. Pt performed STS from EOB with CGA and noted some posterior lean while pt was able to self correct. Pt ambulated to bathroom and performed toilet transfer and clothing management with CGA (+void/BM). Pt noted demonstrate posterior lean when standing from toilet however was able to correct. Pt then ambulated to sink and performed hand hygiene in standing with CGA. Pt stating feeling increasingly "dizzy" BP assessed 137/75. Pt returned to EOB and participated in standing touch to target with increased difficulty with coordination with LLE and LOB to R requiring minA from PTA. Pt returned to supine and participated in SLE x 10 bilaterally and heel slides bilaterally for strengthening. Pt positioned self to comfort and left with call bell within reach and needs met.      Therapy Documentation Precautions:  Precautions Precautions: Fall Precaution Comments: seizure Restrictions Weight Bearing Restrictions: No General:   Vital Signs: Oxygen Therapy SpO2: 95 % O2 Device: Room  Air  Therapy/Group: Individual Therapy  Bernece Gall  Dayanira Giovannetti, PTA  04/09/2018, 4:18 PM

## 2018-04-09 NOTE — Progress Notes (Signed)
Speech Language Pathology Discharge Summary  Patient Details  Name: Christina Orozco MRN: [id number] Date of Birth: Aug 29, 1970  Today's Date: 04/09/2018 SLP Individual Time: 8978-4784 SLP Individual Time Calculation (min): 27 min   Skilled Therapeutic Interventions:  Pt was seen for skilled ST targeting cognitive goals.  Pt was able to recall specific activities from previously scheduled therapy sessions with mod I.  SLP facilitated the session with functional math calculations from word problems to address problem solving goals.  Pt was able to complete task for 90% accuracy with mod I.  Pt reports that task was "easy" for her.  Pt is still limited by significant psychosocial barriers; however, I feel that her cognition is back to baseline.  As a result, no further ST needs are indicated at this time.       Patient has met 5 of 5 long term goals.  Patient to discharge at overall Modified Independent level.  Reasons goals not met:     Clinical Impression/Discharge Summary:   Pt has made limited gains while inpatient and is discharging from [facility] services having met 5 out of 5 short term goals.  Pt is currently mod I for tasks and is back to baseline for cognition.  Pt/family education is complete at this time.    No further ST needs indicated as pt is at baseline for cognition and communication.    Care Partner:  Caregiver Able to Provide Assistance: Yes  Type of Caregiver Assistance: Physical  Recommendation:  None      Equipment: none recommended by SLP    Reasons for discharge: Treatment goals met   Patient/Family Agrees with Progress Made and Goals Achieved: Yes    [name], Selinda Orion 04/09/2018, 2:41 PM

## 2018-04-10 ENCOUNTER — Encounter (HOSPITAL_COMMUNITY): Payer: BLUE CROSS/BLUE SHIELD | Admitting: Psychology

## 2018-04-10 ENCOUNTER — Inpatient Hospital Stay (HOSPITAL_COMMUNITY): Payer: BLUE CROSS/BLUE SHIELD | Admitting: Physical Therapy

## 2018-04-10 ENCOUNTER — Inpatient Hospital Stay (HOSPITAL_COMMUNITY): Payer: BLUE CROSS/BLUE SHIELD | Admitting: Occupational Therapy

## 2018-04-10 LAB — BASIC METABOLIC PANEL
ANION GAP: 7 (ref 5–15)
BUN: 41 mg/dL — ABNORMAL HIGH (ref 6–20)
CO2: 20 mmol/L — AB (ref 22–32)
Calcium: 8.4 mg/dL — ABNORMAL LOW (ref 8.9–10.3)
Chloride: 111 mmol/L (ref 98–111)
Creatinine, Ser: 2.34 mg/dL — ABNORMAL HIGH (ref 0.44–1.00)
GFR calc Af Amer: 28 mL/min — ABNORMAL LOW (ref >60)
GFR calc non Af Amer: 24 mL/min — ABNORMAL LOW (ref >60)
GLUCOSE: 125 mg/dL — AB (ref 70–99)
POTASSIUM: 3.4 mmol/L — AB (ref 3.5–5.1)
Sodium: 138 mmol/L (ref 135–145)

## 2018-04-10 LAB — CBC WITH DIFFERENTIAL/PLATELET
Abs Immature Granulocytes: 0.03 10*3/uL (ref 0.00–0.07)
Basophils Absolute: 0 10*3/uL (ref 0.0–0.1)
Basophils Relative: 0 %
Eosinophils Absolute: 0.2 10*3/uL (ref 0.0–0.5)
Eosinophils Relative: 2 %
HEMATOCRIT: 26 % — AB (ref 36.0–46.0)
Hemoglobin: 8.1 g/dL — ABNORMAL LOW (ref 12.0–15.0)
IMMATURE GRANULOCYTES: 0 %
LYMPHS ABS: 2.6 10*3/uL (ref 0.7–4.0)
LYMPHS PCT: 33 %
MCH: 27.9 pg (ref 26.0–34.0)
MCHC: 31.2 g/dL (ref 30.0–36.0)
MCV: 89.7 fL (ref 80.0–100.0)
MONOS PCT: 5 %
Monocytes Absolute: 0.4 10*3/uL (ref 0.1–1.0)
NEUTROS ABS: 4.6 10*3/uL (ref 1.7–7.7)
NEUTROS PCT: 60 %
Platelets: 346 10*3/uL (ref 150–400)
RBC: 2.9 MIL/uL — ABNORMAL LOW (ref 3.87–5.11)
RDW: 14.5 % (ref 11.5–15.5)
WBC: 7.9 10*3/uL (ref 4.0–10.5)
nRBC: 0 % (ref 0.0–0.2)

## 2018-04-10 LAB — GLUCOSE, CAPILLARY
GLUCOSE-CAPILLARY: 70 mg/dL (ref 70–99)
Glucose-Capillary: 117 mg/dL — ABNORMAL HIGH (ref 70–99)
Glucose-Capillary: 211 mg/dL — ABNORMAL HIGH (ref 70–99)
Glucose-Capillary: 129 mg/dL — ABNORMAL HIGH (ref 70–99)

## 2018-04-10 MED ORDER — POTASSIUM CHLORIDE CRYS ER 20 MEQ PO TBCR
20.0000 meq | EXTENDED_RELEASE_TABLET | Freq: Two times a day (BID) | ORAL | Status: AC
  Administered 2018-04-10 – 2018-04-11 (×4): 20 meq via ORAL
  Filled 2018-04-10 (×4): qty 1

## 2018-04-10 NOTE — Consult Note (Signed)
Neuropsychological Consultation   Patient:   Christina Orozco   DOB:   01/17/1971  MR Number:  [id number]  Location:  [facility] [id number] [facility] [address] Dept: [place]: [phone]           Date of Service:   04/10/2018  Start Time:   1 PM End Time:   2 PM  Provider/Observer:  Ilean Skill, Psy.D.       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  The patient presented on 03/25/2018 with seizure requiring intubation for airway protection.  Systolic blood pressure was in the 200s as well as elevated blood sugars.  Cranial CT reviewed was unremarkable for acute intracranial process.  MRI of the brain showed distinct T2 hyperintensities in the pons progressed from 07/25/2017 MRI.  The patient has had fluctuating pontine signals when compared to studies in 2018 in 2019 favoring PRES for osmotic cessation.  The patient is continued to have difficulties with blood glucose the latest seizure activity while stopping, has been consistent with significant difficulties.  The patient has a long history of very stressful and difficult psychosocial situations and has a history of anxiety and depression along with her hypertension.  There have been a lot of work difficulties as well as family difficulties that have highlighted her difficulties through the years.  The above chief Complaint has been reviewed for this appointment and remains applicable for the current visit.  Patient today had more word finding issues and slowed information processing speed.  She reports that she has these wax and wane in cognitive function.  These symptoms were not present when I saw her two days prior.  Treatment team  stated that the patient looked good this AM without these symptoms.  Patient denied agitation today and anxeity and depression not issues today.  Reason for Service:  The patient was referred for neuropsychological consultation due to coping and adjustment issues during her ongoing rehabilitative efforts.  Below is the HPI for the current admission.  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 Per chart review and patient, patient lives with boyfriend and 94 year old daughter. Independent prior to admission. 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 One level home. Boyfriend works during the day. Presented 03/25/2018 with seizure requiring intubation for airway protection. Noted systolic blood pressure in the 200s as well as elevated blood sugars. Hernial CT scan reviewed unremarkable for acute intracranial process. CT angiogram of head and neck with no dissection aneurysm or significant stenosis. Urine drug screen negative. MRI of the brain showed indistinct T2 hyperintensity in the pons that progressed from 07/25/2017 MRI. The patient has had fluctuating pontine signals when compared to studies in 2018 and 2019 favoring atypical PRESor osmotic demyelination. Latest EEG showing a mixture of theta and alpha rhythms seen from the central and temporal regions but did not record any clinical subclinical seizures. Currently maintained on Keppra for seizure prophylaxis. Plavix initiated for CVA prophylaxis. Subcutaneous heparin for DVT prophylaxis. Initially with nasogastric tube for nutritional support and diet has been advanced to regular. Therapy evaluations completed with recommendations of physical medicine rehab consult. Patient was admitted  for a comprehensive rehab program.  Current Status:  The patient was not emotional today and had no crying spells today.  Did have clear  word finding issues and slowed processing speed relative to two days prior.  The patient reports that she is coping better but has these times where cognition worsens and then improves.  Likely related to underlying cerebral vascular issues.   Behavioral Observation: Christina Orozco  presents as a 47 y.o.-year-old Right African American Female who appeared her stated age. her dress was Appropriate and she was Well Groomed and her manners were Appropriate to the situation.  her participation was indicative of Appropriate and Redirectable behaviors.  There were any physical disabilities noted.  she displayed an appropriate level of cooperation and motivation.     Interactions:    Active Appropriate and Redirectable  Attention:   abnormal and distracted by internal preoccupations  Memory:   within normal limits; recent and remote memory intact  Visuo-spatial:  not examined  Speech (Volume):  normal  Speech:   Word finding deficits today with slowed fluency  Thought Process:  Coherent and Relevant  Though Content:  WNL; not suicidal and not homicidal  Orientation:   person, place, time/date and situation  Judgment:   Poor  Planning:   Poor  Affect:    Blunted, Depressed and Flat  Mood:    Depressed and Dysphoric  Insight:   Fair  Intelligence:   normal   Medical History:   Past Medical History:  Diagnosis Date  . Anemia    severe  . Asthma   . CKD (chronic kidney disease)    stage IV  . DDD (degenerative disc disease), cervical   . Diabetes mellitus ([place])   . Dislocated shoulder    right  . GERD (gastroesophageal reflux disease)   . Hypertension   . Neuropathy   . Stroke [facility])        Abuse/Trauma History: The patient describes a lot of difficulties through the years particular groin up with her parents.  The patient reports that she felt she had a lot of stressors throughout her life and is put a lot of efforts in trying to take care of her kids better than she felt  she was cared for.  The patient reports that she is struggling with the ongoing residual effects of her young life.  Psychiatric History:  While the patient denies any significant history of depression anxiety is clear that there are a great deal of emotional distress and discord in the patient.  The patient appears to have very poor coping strategies and skills.  Family Med/Psych History:  Family History  Problem Relation Age of Onset  . Vascular Disease Mother   . CAD Mother   . Heart failure Mother   . Heart disease Unknown   . Cancer Unknown        colon, stomach, lung  . Diabetes Unknown   . Seizures Unknown   . Breast cancer Sister 52    Risk of Suicide/Violence: low the patient denies any suicidal or homicidal ideation.  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  The patient presented on 03/25/2018 with seizure requiring intubation for airway protection.  Systolic blood pressure was in the 200s as well as elevated blood sugars.  Cranial CT reviewed was unremarkable for acute intracranial process.  MRI of the brain showed distinct T2 hyperintensities  in the pons progressed from 07/25/2017 MRI.  The patient has had fluctuating pontine signals when compared to studies in 2018 in 2019 favoring PRES for osmotic cessation.  The patient is continued to have difficulties with blood glucose the latest seizure activity while stopping, has been consistent with significant difficulties.  The patient has a long history of very stressful and difficult psychosocial situations and has a history of anxiety and depression along with her hypertension.  There have been a lot of work difficulties as well as family difficulties that have highlighted her difficulties through the years.  Patient today had more word finding issues and slowed information processing speed.  She reports that  she has these wax and wane in cognitive function.  These symptoms were not present when I saw her two days prior.  Treatment team stated that the patient looked good this AM without these symptoms.  Patient denied agitation today and anxeity and depression not issues today.  The patient was not emotional today and had no crying spells today.  Did have clear word finding issues and slowed processing speed relative to two days prior.  The patient reports that she is coping better but has these times where cognition worsens and then improves.  Likely related to underlying cerebral vascular issues.    Diagnosis:    PRES (posterior reversible encephalopathy syndrome) - Plan: Ambulatory referral to Neurology  SOB (shortness of breath) - Plan: DG Chest 2 View, DG Chest 2 View, CANCELED: DG Chest 2 View, CANCELED: DG Chest 2 View         Electronically Signed   _______________________ Ilean Skill, Psy.D.

## 2018-04-10 NOTE — Progress Notes (Signed)
Occupational Therapy Session Note  Patient Details  Name: Christina Orozco MRN: [id number] Date of Birth: 10/25/1970  Today's Date: 04/10/2018 OT Individual Time: 4098-1191 OT Individual Time Calculation (min): 60 min    Short Term Goals: Week 1:  OT Short Term Goal 1 (Week 1): Pt will maintain standing balance during LB dressing with CGA.  OT Short Term Goal 2 (Week 1): Pt will perform toilet/BSC transfer with CGA and LRAD.  OT Short Term Goal 3 (Week 1): Pt will perform 3/3 steps of LB dressing.  OT Short Term Goal 4 (Week 1): Pt will require no more than 1 safety cue during ADL task completion.   Skilled Therapeutic Interventions/Progress Updates:    Pt seen this session for ADL training with a focus on activity tolerance and balance.  Pt received in bed with IV line in.  Pt declined a bath today. RN was able to disconnect IV line for pt to don clothing.  Pt ambulated with RW to bathroom and toileted. She continues to need CGA for ambulation as she tends to lean back and has incoordination of trunk.  Pt then ambulated to sink to groom with S in standing.  Her standing tolerance is only 2-3 minutes at a time.  Pt ambulated back to bed and worked on 2 sets of 5 of sit to stand without UE support and close S and 2 sets of 10 squats.  Pt stated she was very tired after these exercises.  Pt continues to have a flat affect but the rate of her speech and clarity of speech has improved a great deal to a functional level.  Pt opted to rest in bed.  Bed alarm set and all needs met.   Therapy Documentation Precautions:  Precautions Precautions: Fall Precaution Comments: seizure Restrictions Weight Bearing Restrictions: No    Vital Signs: Therapy Vitals Pulse Rate: 81 Resp: 18 Patient Position (if appropriate): Lying Oxygen Therapy SpO2: 97 % O2 Device: Room Air Pain: Pain Assessment Pain Score: 0-No pain ADL: ADL Upper Body Bathing: Minimal cueing Where Assessed-Upper Body Bathing:  Shower Lower Body Bathing: Minimal assistance Where Assessed-Lower Body Bathing: Shower Upper Body Dressing: Contact guard Where Assessed-Upper Body Dressing: Wheelchair Lower Body Dressing: Minimal assistance, Other (Comment)(donning briefs only this session) Where Assessed-Lower Body Dressing: Teaching laboratory technician: Minimal assistance Social research officer, government Method: Radiographer, therapeutic: Radio broadcast assistant, Grab bars   Therapy/Group: Individual Therapy  [place] 04/10/2018, 12:09 PM

## 2018-04-10 NOTE — Progress Notes (Signed)
Physical Therapy Session Note  Patient Details  Name: Christina Orozco MRN: [id number] Date of Birth: 1970/06/15  Today's Date: 04/10/2018 PT Individual Time: 0300-9233 PT Individual Time Calculation (min): 75 min   Short Term Goals: Week 2:  PT Short Term Goal 1 (Week 2): STG=LTG due to ELOS   Skilled Therapeutic Interventions/Progress Updates:    Pt received supine in bed and agreeable to PT. Pt independent with use of handrails to EOB. Gait training 125 ft with RW with supervision assist from SPT. Pt self propelled WC 141f to rehab gym.   Pt performed standing clothespin exercise for improved dynamic standing balance. Exercise performed in standing with RW providing occasional assist for maintenance of balance; pt reaching laterally for green clothespin then anteriorly to pin to basketball hoop. Pt removed clothespins and returned to box. SPT providing tactile cue for appropriate lateral weight shift; CGA for exercise.  Sit to stand with ball toss to PT for improved strength, endurance, and dynamic balance. SPT provided CGA for occasional near posterior LOB. 10x, 2 sets.   Forward and lateral step ups on 6" stair. 5x each direction. SPT min assist; provided verbal and tactile cuing to maintain eccentric control during descent. Occasional correction by SPT for LOB.   Gait training with obstacle course 1x down and back to WC. Pt navigated uneven surface, 4 cones 465fapart, and threshold with RW to simulate ambulation in a community environment and decrease risk of falls. Pt min assist for exercise. PT provided education with verbal instructions and demonstration for management of AD when stepping over simulated threshold but pt did not utilize technique this session.   Nustep exercise for endurance training and to facilitate reciprocal LE movement. 3 min intervals, 3x. ~1 min rest between Resistance level 6. Supervision for ex.   [facility]; pt CGA for transfer.  Sit to supine without assist, but minor use of bed rails. Ended session with pt supine in bed. Call bell placed within reach and all needs met.  Therapy Documentation Precautions:  Precautions Precautions: Fall Precaution Comments: seizure Restrictions Weight Bearing Restrictions: No  Vital Signs: Therapy Vitals Temp: 97.9 F (36.6 C) Temp Source: Oral Pulse Rate: 80 Resp: 18 BP: 130/70 Patient Position (if appropriate): Lying Oxygen Therapy SpO2: 98 % O2 Device: Room Air  Pain:  0/10   Therapy/Group: Individual Therapy  AmAmador Cunas0/30/2019, 5:39 PM

## 2018-04-10 NOTE — Progress Notes (Signed)
Physical Therapy Weekly Progress Note  Patient Details  Name: Christina Orozco MRN: [id number] Date of Birth: 1971/04/08  Beginning of progress report period: April 03, 2018 End of progress report period: April 10, 2018  Today's Date: 04/10/2018 PT Individual Time: 1005-1105 PT Individual Time Calculation (min): 60 min   Patient has met 2 of 3 short term goals.  Pt has had slow progress towards long term goals since eval. Progress has been limited by SOB, anxiety, lability, and fear of falling. Over the past week pt has progressed to intermittent supervision assist to CGA for gait with RW up to 161f and transfers with RW. Mild knee instability continues to be present especially when   Patient continues to demonstrate the following deficits muscle weakness, decreased cardiorespiratoy endurance, decreased visual acuity, decreased problem solving, decreased safety awareness and decreased memory and decreased standing balance and decreased balance strategies and therefore will continue to benefit from skilled PT intervention to increase functional independence with mobility.  Patient progressing toward long term goals..  Continue plan of care.  PT Short Term Goals Week 1:  PT Short Term Goal 1 (Week 1): Pt will ambulate x 150 ft with LRAD and SBA PT Short Term Goal 1 - Progress (Week 1): Met PT Short Term Goal 2 (Week 1): Pt will maintain dynamic standing balance with LRAD and CGA consistently PT Short Term Goal 2 - Progress (Week 1): Met PT Short Term Goal 3 (Week 1): Pt will perform transfers with SBA consistently PT Short Term Goal 3 - Progress (Week 1): Progressing toward goal Week 2:  PT Short Term Goal 1 (Week 2): STG=LTG due to ELOS   Skilled Therapeutic Interventions/Progress Updates:   Pt received supine in bed and agreeable to PT. Supine>sit transfer with supervision assist and  Min cues for use of bed rails.   [facility]   Support on RW. WC mobility instructed by PT x 1236fwithout cues or assist from PT.   Gait training with RW x 8310f50ft with close supervision assist from PT. Min cues for AD management in turns from PT for improved safety.   Standing therex BLE, in parallel bars. :  Hip abduction x 8  HS curl x 8 Step ups (4" step). X 5 BLE Calf raises x 15 Sit<>stand without UE support x 10 Min cues from PT for all standing therex for proper ROM, speed of movement and decreased compensatory movements through trunk.   Stair management training x 8 with CGA from SPT, cues for BUE support on rails. Pt noted to self select step over step ascent and step to descent.   Dynamic standing balance at dynavision Program A x 1 min on level surface progressing to unlevel surface on second bout. Supervision assist from PT throughout. One near LOB on unlevel surface, but able to correct without additional assist. Attempted no UE support, but Intermittent UE support on dynavision noted by PT.  Patient returned to room and left sitting in [facility] in reach and all needs met.          Therapy Documentation Precautions:  Precautions Precautions: Fall Precaution Comments: seizure Restrictions Weight Bearing Restrictions: No Vital Signs: Therapy Vitals BP: (!) 153/81 Pain: denies  Therapy/Group: Individual Therapy  AusLorie Phenix/30/2019, 7:53 AM

## 2018-04-10 NOTE — Progress Notes (Signed)
PHYSICAL MEDICINE & REHABILITATION PROGRESS NOTE  Subjective/Complaints: Patient seen laying in bed this morning.  She states she slept well overnight.  She states she feels better overall.  She states she feels like she is improving.  She is more pleasant this morning.  ROS: Denies CP, SOB, N/V/D  Objective: Vital Signs: Blood pressure (!) 153/81, pulse 83, temperature 98.5 F (36.9 C), temperature source Oral, resp. rate 18, height 5\' 3"  (1.6 m), weight 99.3 kg, last menstrual period 03/12/2018, SpO2 96 %. No results found. Recent Labs    04/08/18 0822 04/10/18 0654  WBC 6.9 7.9  HGB 8.0* 8.1*  HCT 25.1* 26.0*  PLT 323 346   Recent Labs    04/08/18 0822 04/10/18 0654  NA 137 138  K 3.3* 3.4*  CL 110 111  CO2 21* 20*  GLUCOSE 134* 125*  BUN 35* 41*  CREATININE 2.35* 2.34*  CALCIUM 8.2* 8.4*    Physical Exam: BP (!) 153/81   Pulse 83   Temp 98.5 F (36.9 C) (Oral)   Resp 18   Ht 5\' 3"  (1.6 m)   Wt 99.3 kg   LMP 03/12/2018   SpO2 96%   BMI 38.79 kg/m  Constitutional: No distress . Vital signs reviewed. HENT: Normocephalic.  Atraumatic. Eyes: EOMI. No discharge. Cardiovascular: RRR.  No JVD. Respiratory: CTA bilaterally.  Normal effort. Upper respiratory congestion GI: BS +. Non-distended. Musc: No edema or tenderness in extremities. Musculoskeletal: She exhibits edema LE.  Neurological:Patient is alert Follows simple commands. Motor: 5/5 stable Psychiatric:Flat, but improving  Assessment/Plan: 1. Functional deficits secondary to seizures which require 3+ hours per day of interdisciplinary therapy in a comprehensive inpatient rehab setting.  Physiatrist is providing close team supervision and 24 hour management of active medical problems listed below.  Physiatrist and rehab team continue to assess barriers to discharge/monitor patient progress toward functional and medical goals  Care Tool:  Bathing  Bathing activity did not occur:  (declined) Body parts bathed by patient: Right arm, Left arm, Chest, Abdomen, Front perineal area, Buttocks, Right upper leg, Left upper leg, Right lower leg, Left lower leg, Face         Bathing assist Assist Level: Contact Guard/Touching assist     Upper Body Dressing/Undressing Upper body dressing   What is the patient wearing?: Pull over shirt    Upper body assist Assist Level: Set up assist    Lower Body Dressing/Undressing Lower body dressing      What is the patient wearing?: Incontinence brief, Pants     Lower body assist Assist for lower body dressing: Minimal Assistance - Patient > 75%     Toileting Toileting    Toileting assist Assist for toileting: Contact Guard/Touching assist     Transfers Chair/bed transfer  Transfers assist     Chair/bed transfer assist level: Contact Guard/Touching assist     Locomotion Ambulation   Ambulation assist      Assist level: Contact Guard/Touching assist Assistive device: Walker-rolling Max distance: 40 ft   Walk 10 feet activity   Assist     Assist level: Contact Guard/Touching assist Assistive device: Walker-rolling   Walk 50 feet activity   Assist    Assist level: Contact Guard/Touching assist Assistive device: Walker-rolling    Walk 150 feet activity   Assist Walk 150 feet activity did not occur: Safety/medical concerns  Assist level: Contact Guard/Touching assist Assistive device: Walker-rolling    Walk 10 feet on uneven surface  activity  Assist Walk 10 feet on uneven surfaces activity did not occur: Safety/medical concerns         Wheelchair     Assist Will patient use wheelchair at discharge?: No      Wheelchair assist level: Supervision/Verbal cueing Max wheelchair distance: 154ft    Wheelchair 50 feet with 2 turns activity    Assist            Wheelchair 150 feet activity     Assist            Medical Problem List and  Plan: 1.Decreased functional abilitysecondary to seizures thought to be secondary to uncontrolled hypertension/PRESversus hypoglycemia  Continue CIR  2. DVT Prophylaxis/Anticoagulation: Subcutaneous heparin. Monitor for any bleeding episodes 3. Pain Management:Oxycodone as needed 4. Mood:Provide emotional support 5. Neuropsych: This patientis ?fullycapable of making decisions on herown behalf. 6. Skin/Wound Care:Routine skin checks, encourage appropriate nutrition 7. Fluids/Electrolytes/Nutrition:Routine in and outs  8.Seizure disorder. Keppra 500 mg twice daily 9.Hypertension. Norvasc 10 mg daily, Coreg 25 mg twice daily, hydralazine 100 mg every 8 hours  Cardura increased to 4 mg on 10/26  Elevated this a.m., likely related to IVF, continue to monitor 10.Diabetes mellitus with peripheral neuropathy. Hemoglobin A1c 12.5. SSI.  Patient on Tresiba 60 units daily prior to admission.Resume as needed. Diabetic teaching  Lantus 10 nightly started on 10/24, increased to 14u 10/26, increased to 20 units on 10/29  Cont to monitor  Labile on 10/30 11.CKD stage III. Monitor urine output  Creatinine 2.34 on 10/30  Will order IVF x2 nights, echo reviewed EF to 55-60%   Encourage fluids 12.Asthma. Continue nebulizers as directed. 13.Hyperlipidemia. Lipitor 14.  Hypoalbuminemia  Supplement initiated on 10/23 15.  Acute on chronic anemia  Hemoglobin 8.1 on 10/30  Hemoccult pending  Continue to monitor 16.  Morbid obesity  Encourage weight loss 17.  Candidiasis  Diflucan x1 ordered on 10/24 18. Pneumonia  Chest x-ray reviewed- LLL infiltrate with effusion  Weaned supplemental oxygen  Lasix 40 daily started on 10/24  Levaquin started on 10/24 19. Nasal congestion  Continue mucinex, flonase  Afrin d/ced on 10/28  Encourage IS  Claritin started on 10/28  Improving 20.  Hypokalemia  Supplemented x2 days  LOS: 8 days A FACE TO FACE EVALUATION WAS  PERFORMED  Ankit [name] 04/10/2018, 8:31 AM

## 2018-04-11 ENCOUNTER — Inpatient Hospital Stay (HOSPITAL_COMMUNITY): Payer: BLUE CROSS/BLUE SHIELD | Admitting: Occupational Therapy

## 2018-04-11 ENCOUNTER — Inpatient Hospital Stay (HOSPITAL_COMMUNITY): Payer: BLUE CROSS/BLUE SHIELD | Admitting: Physical Therapy

## 2018-04-11 DIAGNOSIS — E876 Hypokalemia: Secondary | ICD-10-CM

## 2018-04-11 LAB — GLUCOSE, CAPILLARY
GLUCOSE-CAPILLARY: 81 mg/dL (ref 70–99)
GLUCOSE-CAPILLARY: 159 mg/dL — AB (ref 70–99)
GLUCOSE-CAPILLARY: 172 mg/dL — AB (ref 70–99)
Glucose-Capillary: 84 mg/dL (ref 70–99)

## 2018-04-11 LAB — OCCULT BLOOD X 1 CARD TO LAB, STOOL: Fecal Occult Bld: NEGATIVE

## 2018-04-11 MED ORDER — ALBUTEROL SULFATE (2.5 MG/3ML) 0.083% IN NEBU
2.5000 mg | INHALATION_SOLUTION | Freq: Two times a day (BID) | RESPIRATORY_TRACT | Status: DC
  Administered 2018-04-11 – 2018-04-12 (×4): 2.5 mg via RESPIRATORY_TRACT
  Filled 2018-04-11 (×5): qty 3

## 2018-04-11 NOTE — Progress Notes (Signed)
Social Work Patient ID: Christina Orozco, female   DOB: 1970/10/01, 47 y.o.   MRN: [id number]   Have reviewed team conference with pt who is aware and agreeable with targeted d/c date of 11/2  and goals of mod ind.  Pt actually smiles and yells out "Saturday! [name]!"  She does note she has concerns about cost of medications at d/c.  Will review meds with MD/PA and determine if any assistance can be provided.  Pt is agreeable with recommendation of [place] therapy follow up and rolling walker.  Continue to follow.  [name], LCSW

## 2018-04-11 NOTE — Patient Care Conference (Addendum)
Inpatient RehabilitationTeam Conference and Plan of Care Update Date: 04/10/2018   Time: 2:35 PM    Patient Name: Christina Orozco      Medical Record Number: [id number]  Date of Birth: 20-Apr-1971 Sex: Female         Room/Bed: 4M08C/4M08C-01 Payor Info: Payor: [place] / Plan: [employer]BS OTHER / Product Type: *No Product type* /    Admitting Diagnosis: seizures  Admit Date/Time:  04/02/2018  6:21 PM Admission Comments: No comment available   Primary Diagnosis:  <principal problem not specified> Principal Problem: <principal problem not specified>  Patient Active Problem List   Diagnosis Date Noted  . Congestion of nasal sinus   . Pneumonia of left lower lobe due to infectious organism ([place])   . SOB (shortness of breath)   . Candidiasis   . Stage 3 chronic kidney disease ([place])   . Acute on chronic anemia   . Hypoalbuminemia due to protein-calorie malnutrition ([place])   . Essential hypertension   . Poorly controlled type 2 diabetes mellitus with peripheral neuropathy ([place])   . Morbidly obese ([place])   . Seizure ([place]) 04/02/2018  . PRES (posterior reversible encephalopathy syndrome)   . CKD (chronic kidney disease), stage III ([place])   . Diabetes mellitus type 2 in obese ([place])   . Benign essential HTN   . Hypokalemia   . History of CVA with residual deficit   . Seizures ([place]) 03/25/2018  . Diabetes ([place]) 09/28/2017  . Anemia of chronic disease 08/09/2017  . Hyperlipidemia 07/26/2017  . 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[place]) 07/25/2017  . Anxiety as acute reaction to exceptional stress 04/23/2017  . Insomnia 10/08/2016  . Bacterial vaginitis 10/08/2016  . Neuropathy 06/07/2016  . Gait instability   . Lesion of pons   . Slurred speech   . Dizziness 04/30/2016  . Hyperglycemia 04/30/2016  . Chronic kidney disease (CKD), stage III (moderate) ([place]) 04/30/2016  . Ataxia 04/30/2016  . Chronic right shoulder pain 04/03/2016  . 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 Rhinitis,  allergic 08/25/2015  . Asthma, mild intermittent 07/14/2015  . Gastroesophageal reflux disease without esophagitis 06/08/2015  . Alopecia 06/08/2015  . Hypertensive cardiovascular disease 05/05/2015  . Vitamin D deficiency 05/05/2015    Expected Discharge Date: Expected Discharge Date: 04/13/18  Team Members Present: Physician leading conference: Dr. Delice Lesch Social Worker Present: Lennart Pall, LCSW Nurse Present: Rayetta Pigg, RN PT Present: Barrie Folk, PT;Rosita Dechalus, PTA OT Present: Clyda Greener, OT SLP Present: Windell Moulding, SLP PPS Coordinator present : Daiva Nakayama, RN, CRRN     Current Status/Progress Goal Weekly Team Focus  Medical   Decreased functional ability secondary to seizures thought to be secondary to uncontrolled hypertension/PRES versus hypoglycemia  Improve mobility, HTN/DM, CKD, K+  See above   Bowel/Bladder   Continent of bowel and bladder; LBM 04/08/2018  Remain continent  Assess bowel and bladder needs q shift and PRN   Swallow/Nutrition/ Hydration             ADL's   set up UB self care, S LB self care, CGA with transfers or sit to stand from low surfaces, very low standing endurance, decreased endurance for ambulation, decreased dynamic standing balance  mod independent overall  ADL retraining, functional transfers, endurance, strengthening, balance   Mobility   CGA-supervision assist for all transfers and Gait with RW. supervision assist bed mobility.   Mod I overall with LRAD for all mobility   improved participation in therapy. balance, strengthening, safety, gait,  and improved endurance.    Communication             Safety/Cognition/ Behavioral Observations            Pain   No c/o pain  Pain </= 2/10  Assess pain q shift and PRN and medicate as necessary   Skin   Fissure in crease of buttocks covered with foam, ecchymosis to RUE and LUE; no other skin issues  Remain free of infection and breakdown  Assess skin q shift and PRN and  address any areas of concern    Rehab Goals Patient on target to meet rehab goals: Yes *See Care Plan and progress notes for long and short-term goals.     Barriers to Discharge  Current Status/Progress Possible Resolutions Date Resolved   Physician    Medical stability     See above  Therapies, optimize BP/DM meds, follow labs, supplement K+, abx for PNA      Nursing                  PT                    OT                  SLP                SW                Discharge Planning/Teaching Needs:  Plan home with spouse and brother able to provide assistance if needed.  NA with mod ind goals   Team Discussion:  MD watching BP, DM, AKI, PNA (abx completed).  Still with poor endurance but making gains.  Set-up/ supervision UB self-care.  Ambulating 150'and trs with supervision and CGA with steps.  On track to meet mod ind goals overall.  Revisions to Treatment Plan:  NA    Continued Need for Acute Rehabilitation Level of Care: The patient requires daily medical management by a physician with specialized training in physical medicine and rehabilitation for the following conditions: Daily direction of a multidisciplinary physical rehabilitation program to ensure safe treatment while eliciting the highest outcome that is of practical value to the patient.: Yes Daily medical management of patient stability for increased activity during participation in an intensive rehabilitation regime.: Yes Daily analysis of laboratory values and/or radiology reports with any subsequent need for medication adjustment of medical intervention for : Neurological problems;Blood pressure problems;Diabetes problems;Renal problems;Other;Pulmonary problems   I attest that I was present, lead the team conference, and concur with the assessment and plan of the team.   Dexter Signor 04/11/2018, 9:36 AM

## 2018-04-11 NOTE — Plan of Care (Signed)
  Problem: Consults Goal: RH GENERAL PATIENT EDUCATION Description See Patient Education module for education specifics. Outcome: Progressing Goal: Diabetes Guidelines if Diabetic/Glucose > 140 Description If diabetic or lab glucose is > 140 mg/dl - Initiate Diabetes/Hyperglycemia Guidelines & Document Interventions  Outcome: Progressing   Problem: RH BLADDER ELIMINATION Goal: RH STG MANAGE BLADDER WITH ASSISTANCE Description STG Manage Bladder With mod I Assistance  Outcome: Progressing Flowsheets (Taken 04/11/2018 1423) STG: Pt will manage bladder with assistance: 6-Modified independent   Problem: RH SKIN INTEGRITY Goal: RH STG MAINTAIN SKIN INTEGRITY WITH ASSISTANCE Description STG Maintain Skin Integrity With mod I Assistance.  Outcome: Progressing Flowsheets (Taken 04/11/2018 1423) STG: Maintain skin integrity with assistance: 4-Minimal assistance

## 2018-04-11 NOTE — Progress Notes (Signed)
Occupational Therapy Session Note  Patient Details  Name: Christina Orozco MRN: [id number] Date of Birth: May 30, 1971  Today's Date: 04/11/2018 OT Individual Time: 0071-2197 OT Individual Time Calculation (min): 28 min    Short Term Goals: Week 1:  OT Short Term Goal 1 (Week 1): Pt will maintain standing balance during LB dressing with CGA.  OT Short Term Goal 2 (Week 1): Pt will perform toilet/BSC transfer with CGA and LRAD.  OT Short Term Goal 3 (Week 1): Pt will perform 3/3 steps of LB dressing.  OT Short Term Goal 4 (Week 1): Pt will require no more than 1 safety cue during ADL task completion.   Skilled Therapeutic Interventions/Progress Updates:    Pt completed all functional mobility and transfers with use of the RW with supervision to and from the tub/shower room and ADL apartment.  Had pt complete tub/shower transfers with use of the RW and stepping over the edge of the tub.  She was able to complete with close supervision.  Discussed need for shower seat, however insurance will not cover and pt cannot afford to buy.  She reports wanting to progress to being able to get down in the tub for bathing.  Recommend sponge bathing to start and progression of transfers down into the tub for bathing with therapy assisting.  Finished session with ambulation back to the room and pt left sitting EOB per request with call button and phone in reach and bed alarm in place.    Therapy Documentation Precautions:  Precautions Precautions: Fall Precaution Comments: seizure Restrictions Weight Bearing Restrictions: No  Pain: Pain Assessment Pain Score: 0-No pain    Therapy/Group: Individual Therapy  Rashi Giuliani OTR/L 04/11/2018, 3:09 PM

## 2018-04-11 NOTE — Progress Notes (Addendum)
Occupational Therapy Session Note  Patient Details  Name: Christina Orozco MRN: [id number] Date of Birth: 07-26-1970  Today's Date: 04/11/2018 OT Individual Time: 9030-0923 OT Individual Time Calculation (min): 75 min    Short Term Goals: Week 1:  OT Short Term Goal 1 (Week 1): Pt will maintain standing balance during LB dressing with CGA.  OT Short Term Goal 2 (Week 1): Pt will perform toilet/BSC transfer with CGA and LRAD.  OT Short Term Goal 3 (Week 1): Pt will perform 3/3 steps of LB dressing.  OT Short Term Goal 4 (Week 1): Pt will require no more than 1 safety cue during ADL task completion.   Skilled Therapeutic Interventions/Progress Updates: Patient receiving meds and having IV removed upon approach for OT.   She participated in the session as follows:    -She completed toilet transfer bed to regular toilet with rolling walker with close S - toileted with Supervision, including clothing management -Shower transfer via RW and shower chair and holding to grab bar with one hand with close S -UB bathing and dressing= S -LB bathing=crossing leg over one at a time for feet and going sit to stand, holding to grab bar with one hand with close S -LB dressing= closed legs to don ted hose, don pants, and shoes, sit to stand at [place] with close S, stood at walker without loss of balance to fasten brief and pull up pants, at times holding onto walker for safety - this session she initiated incorportating Safety without prompts or cues from this clinician  At the end of the session, she sat edge of bed waiting for her brother to arrive and in order to be ready to sign papers.   Still, her bed alarm was set and call bell andphone was within her reach.     Therapy Documentation Precautions:  Precautions Precautions: Fall Precaution Comments: seizure Restrictions Weight Bearing Restrictions: No  Pain:denied     Therapy/Group: Individual Therapy  Alfredia Ferguson [facility] 04/11/2018,  10:13 AM

## 2018-04-11 NOTE — Progress Notes (Signed)
[place] PHYSICAL MEDICINE & REHABILITATION PROGRESS NOTE  Subjective/Complaints: Patient seen laying in bed this morning.  He states she "slept" overnight.  She states she is doing better.  ROS: Denies CP, SOB, N/V/D  Objective: Vital Signs: Blood pressure (!) 142/67, pulse 82, temperature 98.4 F (36.9 C), resp. rate 18, height 5\' 3"  (1.6 m), weight 99.3 kg, last menstrual period 03/12/2018, SpO2 100 %. No results found. Recent Labs    04/10/18 0654  WBC 7.9  HGB 8.1*  HCT 26.0*  PLT 346   Recent Labs    04/10/18 0654  NA 138  K 3.4*  CL 111  CO2 20*  GLUCOSE 125*  BUN 41*  CREATININE 2.34*  CALCIUM 8.4*    Physical Exam: BP (!) 142/67 (BP Location: Right Arm)   Pulse 82   Temp 98.4 F (36.9 C)   Resp 18   Ht 5\' 3"  (1.6 m)   Wt 99.3 kg   LMP 03/12/2018   SpO2 100%   BMI 38.79 kg/m  Constitutional: No distress . Vital signs reviewed. HENT: Normocephalic.  Atraumatic. Eyes: EOMI. No discharge. Cardiovascular: RRR.  No JVD. Respiratory: CTA bilaterally.  Normal effort. Upper respiratory congestion, improving GI: BS +. Non-distended. Musc: No edema or tenderness in extremities. Musculoskeletal: She exhibits edema LE.  Neurological:Patient is alert Follows simple commands. Motor: 5/5 unchanged Psychiatric:Flat  Assessment/Plan: 1. Functional deficits secondary to seizures which require 3+ hours per day of interdisciplinary therapy in a comprehensive inpatient rehab setting.  Physiatrist is providing close team supervision and 24 hour management of active medical problems listed below.  Physiatrist and rehab team continue to assess barriers to discharge/monitor patient progress toward functional and medical goals  Care Tool:  Bathing  Bathing activity did not occur: (declined) Body parts bathed by patient: Right arm, Left arm, Chest, Abdomen, Front perineal area, Buttocks, Right upper leg, Left upper leg, Right lower leg, Left lower leg, Face        Bathing assist Assist Level: Contact Guard/Touching assist     Upper Body Dressing/Undressing Upper body dressing   What is the patient wearing?: Pull over shirt    Upper body assist Assist Level: Set up assist    Lower Body Dressing/Undressing Lower body dressing      What is the patient wearing?: Incontinence brief, Pants     Lower body assist Assist for lower body dressing: Supervision/Verbal cueing     Toileting Toileting    Toileting assist Assist for toileting: Supervision/Verbal cueing     Transfers Chair/bed transfer  Transfers assist     Chair/bed transfer assist level: Supervision/Verbal cueing     Locomotion Ambulation   Ambulation assist      Assist level: Supervision/Verbal cueing Assistive device: Walker-rolling Max distance: 125 ft   Walk 10 feet activity   Assist     Assist level: Supervision/Verbal cueing Assistive device: Walker-rolling   Walk 50 feet activity   Assist    Assist level: Supervision/Verbal cueing Assistive device: Walker-rolling    Walk 150 feet activity   Assist Walk 150 feet activity did not occur: Safety/medical concerns  Assist level: Contact Guard/Touching assist Assistive device: Walker-rolling    Walk 10 feet on uneven surface  activity   Assist Walk 10 feet on uneven surfaces activity did not occur: Safety/medical concerns   Assist level: Contact Guard/Touching assist Assistive device: Walker-rolling   Wheelchair     Assist Will patient use wheelchair at discharge?: No Type of Wheelchair: Manual  Wheelchair assist level: Supervision/Verbal cueing Max wheelchair distance: 125 ft    Wheelchair 50 feet with 2 turns activity    Assist        Assist Level: Supervision/Verbal cueing   Wheelchair 150 feet activity     Assist            Medical Problem List and Plan: 1.Decreased functional abilitysecondary to seizures thought to be secondary to  uncontrolled hypertension/PRESversus hypoglycemia  Continue CIR  2. DVT Prophylaxis/Anticoagulation: Subcutaneous heparin. Monitor for any bleeding episodes 3. Pain Management:Oxycodone as needed 4. Mood:Provide emotional support 5. Neuropsych: This patientis ?fullycapable of making decisions on herown behalf. 6. Skin/Wound Care:Routine skin checks, encourage appropriate nutrition 7. Fluids/Electrolytes/Nutrition:Routine in and outs  8.Seizure disorder. Keppra 500 mg twice daily 9.Hypertension. Norvasc 10 mg daily, Coreg 25 mg twice daily, hydralazine 100 mg every 8 hours  Cardura increased to 4 mg on 10/26  Relatively controlled on 10/31 10.Diabetes mellitus with peripheral neuropathy. Hemoglobin A1c 12.5. SSI.  Patient on Tresiba 60 units daily prior to admission.Resume as needed. Diabetic teaching  Lantus 10 nightly started on 10/24, increased to 14u 10/26, increased to 20 units on 10/29  Cont to monitor  Labile on 10/31 11.CKD stage III. Monitor urine output  Creatinine 2.34 on 10/30  IVF x2 nights completed on 10/31, echo reviewed EF to 55-60%  Labs ordered for tomorrow  Encourage fluids 12.Asthma. Continue nebulizers as directed. 13.Hyperlipidemia. Lipitor 14.  Hypoalbuminemia  Supplement initiated on 10/23 15.  Acute on chronic anemia  Hemoglobin 8.1 on 10/30  Hemoccult remains pending, will discuss with nursing  Labs ordered for tomorrow  Continue to monitor 16.  Morbid obesity  Encourage weight loss 17.  Candidiasis  Diflucan x1 ordered on 10/24 18. Pneumonia  Chest x-ray reviewed- LLL infiltrate with effusion, Levaquin course completed  Weaned supplemental oxygen 19. Nasal congestion  Continue mucinex, flonase  Afrin d/ced on 10/28  Encourage IS  Claritin started on 10/28  Improving 20.  Hypokalemia  Supplemented x2 days  Labs ordered for tomorrow 21.  Pleural effusion  Lasix 40 daily started on 10/24   LOS: 9 days A FACE TO  FACE EVALUATION WAS PERFORMED  Bellina Tokarczyk [name] 04/11/2018, 8:41 AM

## 2018-04-11 NOTE — Progress Notes (Signed)
Physical Therapy Session Note  Patient Details  Name: Christina Orozco MRN: [id number] Date of Birth: 12/01/1970  Today's Date: 04/11/2018 PT Individual Time: 1400-1530 PT Individual Time Calculation (min): 90 min   Short Term Goals: Week 2:  PT Short Term Goal 1 (Week 2): STG=LTG due to ELOS   Skilled Therapeutic Interventions/Progress Updates:     Pt received supine in bed and agreeable to PT. Performed stand pivot transfer to [facility] with supervision assist from SPT. Pt self propelled WC ~166f to rehab gym for increased endurance.   Car transfer; [facility]. Pt supervision assist from SPT. Min cueing required for proper technique getting into car.   Gait training to improve strength/endurance 178 ft with RW to rehab gym with supervision assist from SPT to ensure safety during ambulation.   Stair training 6" steps ascend/descend 4 stairs 2x to improve BLE strength/endurance. CGA from SPT to improve safety of stair management.  Balance training with Wii Fit. Pt training ankle strategy and dynamic balance via penguin and table tilt games [place] min assist for balance exercise. SPT provided tactile and verbal cuing to utilize ankle strategy and appropriate weight shifting. Pt demonstrated excessive use of hip strategy with poor carryover for use of ankle strategy during balance ex's.   Reviewed and prescribed HEP; performed 2 sets of the following ex's: LAQ, 10x Sit to stand using BUE support through [place] 5x Standing hip abduction, 10x Standing knee flexion, 10x Standing knee bends, 10x Semi-tandem stance, hold 10 sec  Verbal cues provided for LAQ to maintain eccentric control during exercise. Verbal/tactile cues to maintain upright standing and avoid excessive trunk movement during standing exercises.   [facility] supervision assist. Ended session with pt in supine, call bell within reach, and all needs met.  Therapy  Documentation Precautions:  Precautions Precautions: Fall Precaution Comments: seizure Restrictions Weight Bearing Restrictions: No Vital Signs:  Pain: 0/10    Therapy/Group: Individual Therapy  AAmador Cunas10/31/2019, 4:16 PM

## 2018-04-12 ENCOUNTER — Encounter: Payer: BLUE CROSS/BLUE SHIELD | Admitting: *Deleted

## 2018-04-12 ENCOUNTER — Encounter (HOSPITAL_COMMUNITY): Payer: BLUE CROSS/BLUE SHIELD

## 2018-04-12 ENCOUNTER — Inpatient Hospital Stay (HOSPITAL_COMMUNITY): Payer: BLUE CROSS/BLUE SHIELD | Admitting: Physical Therapy

## 2018-04-12 ENCOUNTER — Inpatient Hospital Stay (HOSPITAL_COMMUNITY): Payer: BLUE CROSS/BLUE SHIELD | Admitting: Occupational Therapy

## 2018-04-12 DIAGNOSIS — I5189 Other ill-defined heart diseases: Secondary | ICD-10-CM

## 2018-04-12 LAB — CBC WITH DIFFERENTIAL/PLATELET
ABS IMMATURE GRANULOCYTES: 0.02 10*3/uL (ref 0.00–0.07)
BASOS ABS: 0 10*3/uL (ref 0.0–0.1)
Basophils Relative: 1 %
EOS PCT: 3 %
Eosinophils Absolute: 0.2 10*3/uL (ref 0.0–0.5)
HCT: 26.8 % — ABNORMAL LOW (ref 36.0–46.0)
HEMOGLOBIN: 8.5 g/dL — AB (ref 12.0–15.0)
Immature Granulocytes: 0 %
LYMPHS ABS: 2.1 10*3/uL (ref 0.7–4.0)
LYMPHS PCT: 33 %
MCH: 28.3 pg (ref 26.0–34.0)
MCHC: 31.7 g/dL (ref 30.0–36.0)
MCV: 89.3 fL (ref 80.0–100.0)
Monocytes Absolute: 0.4 10*3/uL (ref 0.1–1.0)
Monocytes Relative: 7 %
NEUTROS ABS: 3.7 10*3/uL (ref 1.7–7.7)
Neutrophils Relative %: 56 %
Platelets: 326 10*3/uL (ref 150–400)
RBC: 3 MIL/uL — ABNORMAL LOW (ref 3.87–5.11)
RDW: 14.4 % (ref 11.5–15.5)
WBC: 6.5 10*3/uL (ref 4.0–10.5)
nRBC: 0 % (ref 0.0–0.2)

## 2018-04-12 LAB — BASIC METABOLIC PANEL
Anion gap: 5 (ref 5–15)
BUN: 38 mg/dL — AB (ref 6–20)
CHLORIDE: 112 mmol/L — AB (ref 98–111)
CO2: 20 mmol/L — AB (ref 22–32)
Calcium: 8.6 mg/dL — ABNORMAL LOW (ref 8.9–10.3)
Creatinine, Ser: 2.27 mg/dL — ABNORMAL HIGH (ref 0.44–1.00)
GFR calc Af Amer: 29 mL/min — ABNORMAL LOW (ref >60)
GFR calc non Af Amer: 25 mL/min — ABNORMAL LOW (ref >60)
GLUCOSE: 148 mg/dL — AB (ref 70–99)
POTASSIUM: 3.9 mmol/L (ref 3.5–5.1)
Sodium: 137 mmol/L (ref 135–145)

## 2018-04-12 LAB — GLUCOSE, CAPILLARY
GLUCOSE-CAPILLARY: 131 mg/dL — AB (ref 70–99)
GLUCOSE-CAPILLARY: 150 mg/dL — AB (ref 70–99)
GLUCOSE-CAPILLARY: 149 mg/dL — AB (ref 70–99)
Glucose-Capillary: 85 mg/dL (ref 70–99)

## 2018-04-12 MED ORDER — DM-GUAIFENESIN ER 30-600 MG PO TB12: 1.0000 | ORAL_TABLET | Freq: Two times a day (BID) | ORAL | 0 refills | Status: DC

## 2018-04-12 MED ORDER — FUROSEMIDE 40 MG PO TABS: 40.0000 mg | ORAL_TABLET | Freq: Every day | ORAL | 1 refills | Status: DC

## 2018-04-12 MED ORDER — GUAIFENESIN 100 MG/5ML PO SOLN: 5.0000 mL | ORAL | 0 refills | Status: DC | PRN

## 2018-04-12 MED ORDER — OXYCODONE HCL 5 MG PO TABS: 5.0000 mg | ORAL_TABLET | ORAL | 0 refills | Status: DC | PRN

## 2018-04-12 MED ORDER — GLUCOSE BLOOD VI STRP: 1.0000 | ORAL_STRIP | Freq: Two times a day (BID) | 12 refills | Status: DC

## 2018-04-12 MED ORDER — INSULIN GLARGINE 100 UNITS/ML SOLOSTAR PEN: 20.0000 [IU] | PEN_INJECTOR | Freq: Every day | SUBCUTANEOUS | 11 refills | Status: DC

## 2018-04-12 MED ORDER — LEVETIRACETAM 500 MG PO TABS: 500.0000 mg | ORAL_TABLET | Freq: Two times a day (BID) | ORAL | 0 refills | Status: DC

## 2018-04-12 MED ORDER — PANTOPRAZOLE SODIUM 40 MG PO TBEC: 40.0000 mg | DELAYED_RELEASE_TABLET | Freq: Every day | ORAL | 0 refills | Status: DC

## 2018-04-12 MED ORDER — FERROUS SULFATE 325 (65 FE) MG PO TABS: 325.0000 mg | ORAL_TABLET | Freq: Every day | ORAL | 3 refills | Status: DC

## 2018-04-12 MED ORDER — ACETAMINOPHEN 325 MG PO TABS: 650.0000 mg | ORAL_TABLET | Freq: Four times a day (QID) | ORAL | Status: DC | PRN

## 2018-04-12 MED ORDER — ATORVASTATIN CALCIUM 40 MG PO TABS: ORAL_TABLET | ORAL | 0 refills | Status: DC

## 2018-04-12 MED ORDER — LORATADINE 10 MG PO TABS: 10.0000 mg | ORAL_TABLET | Freq: Every day | ORAL | 0 refills | Status: DC

## 2018-04-12 MED ORDER — CARVEDILOL 25 MG PO TABS: 25.0000 mg | ORAL_TABLET | Freq: Two times a day (BID) | ORAL | 3 refills | Status: DC

## 2018-04-12 MED ORDER — AMLODIPINE BESYLATE 10 MG PO TABS: 10.0000 mg | ORAL_TABLET | Freq: Every day | ORAL | 3 refills | Status: DC

## 2018-04-12 MED ORDER — HYDRALAZINE HCL 100 MG PO TABS: 100.0000 mg | ORAL_TABLET | Freq: Four times a day (QID) | ORAL | 1 refills | Status: DC

## 2018-04-12 MED ORDER — DOXAZOSIN MESYLATE 4 MG PO TABS: 4.0000 mg | ORAL_TABLET | Freq: Every day | ORAL | 1 refills | Status: DC

## 2018-04-12 MED ORDER — ALBUTEROL SULFATE HFA 108 (90 BASE) MCG/ACT IN AERS: INHALATION_SPRAY | RESPIRATORY_TRACT | 0 refills | Status: AC

## 2018-04-12 MED ORDER — CLOPIDOGREL BISULFATE 75 MG PO TABS: 75.0000 mg | ORAL_TABLET | Freq: Every day | ORAL | 1 refills | Status: DC

## 2018-04-12 NOTE — Discharge Instructions (Signed)
Inpatient Rehab Discharge Instructions  [facility] Discharge date and time: No discharge date for patient encounter.   Activities/Precautions/ Functional Status: Activity: activity as tolerated Diet: diabetic diet Wound Care: none needed Functional status:  ___ No restrictions     ___ Walk up steps independently ___ 24/7 supervision/assistance   ___ Walk up steps with assistance ___ Intermittent supervision/assistance  ___ Bathe/dress independently ___ Walk with walker     _x__ Bathe/dress with assistance ___ Walk Independently    ___ Shower independently ___ Walk with assistance    ___ Shower with assistance ___ No alcohol     ___ Return to work/school ________   COMMUNITY REFERRALS UPON DISCHARGE:    Home Health:   PT     OT                       Agency:  [facility] Phone: [phone]   Medical Equipment/Items Ordered: rolling walker                                                     Agency/Supplier:  [facility] [phone]      Special Instructions: No driving smoking or alcohol   My questions have been answered and I understand these instructions. I will adhere to these goals and the provided educational materials after my discharge from the hospital.  Patient/Caregiver Signature _______________________________ Date __________  Clinician Signature _______________________________________ Date __________  Please bring this form and your medication list with you to all your follow-up doctor's appointments.

## 2018-04-12 NOTE — Progress Notes (Signed)
Occupational Therapy Session Note  Patient Details  Name: Christina Orozco MRN: [id number] Date of Birth: 1970-11-17  Today's Date: 04/12/2018 OT Individual Time: 0930-1030 OT Individual Time Calculation (min): 60 min    Short Term Goals: Week 1:  OT Short Term Goal 1 (Week 1): Pt will maintain standing balance during LB dressing with CGA.  OT Short Term Goal 2 (Week 1): Pt will perform toilet/BSC transfer with CGA and LRAD.  OT Short Term Goal 3 (Week 1): Pt will perform 3/3 steps of LB dressing.  OT Short Term Goal 4 (Week 1): Pt will require no more than 1 safety cue during ADL task completion.   Skilled Therapeutic Interventions/Progress Updates:    Pt seen for BADL training with a focus on pt managing at an independent level. Pt used RW to ambulate in room. Pt gathered clothing, ambulated to bathroom, showered, dried off and dressed all independently.  Pt donned TED hose without A.  Pt requested a prescription for a hand held shower and a small grab bar.  Informed her Education officer, museum.   Pt completed all self care and then used RW to ambulate in hallway to ADL apt to sit on couch to rest and then returned to room. Pt in room with all needs met.  Pt is now mod I in the room to ambulate to toilet as needed.   Therapy Documentation Precautions:  Precautions Precautions: Fall Precaution Comments: seizure Restrictions Weight Bearing Restrictions: No  Vital Signs: Oxygen Therapy SpO2: 95 % O2 Device: Room Air FiO2 (%): 21 % Pain: Pain Assessment Pain Score: 0-No pain ADL: ADL Upper Body Bathing: Minimal cueing Where Assessed-Upper Body Bathing: Shower Lower Body Bathing: Minimal assistance Where Assessed-Lower Body Bathing: Shower Upper Body Dressing: Contact guard Where Assessed-Upper Body Dressing: Wheelchair Lower Body Dressing: Minimal assistance, Other (Comment)(donning briefs only this session) Where Assessed-Lower Body Dressing: Teaching laboratory technician:  Minimal assistance Social research officer, government Method: Radiographer, therapeutic: Radio broadcast assistant, Grab bars   Therapy/Group: Individual Therapy  SAGUIER,JULIA 04/12/2018, 10:41 AM

## 2018-04-12 NOTE — Discharge Summary (Signed)
Christina Orozco, Christina Orozco MEDICAL RECORD [id number] ACCOUNT [id number] DATE OF BIRTH:06-22-1970 FACILITY: [facility] LOCATION: [facility] PHYSICIAN:ANKIT PATEL, MD  DISCHARGE SUMMARY  DATE OF DISCHARGE:  04/13/2018  DISCHARGE DIAGNOSES: 1.  Decreased functional ability secondary to seizure thought to be secondary to uncontrolled hypertension-PRES (posterior reversible encephalopathy syndrome). 2.  Subcutaneous heparin for deep venous thrombosis prophylaxis. 3.  Seizure disorder. 4.  Hypertension. 5.  Diabetes mellitus. 6.  Chronic kidney disease, stage III. 7.  Asthma. 8.  Hyperlipidemia. 9.  Acute on chronic anemia. 10.  Morbid obesity. 12.  Medical noncompliance.  HISTORY OF PRESENT ILLNESS:  This is a 47 year old right-handed female, multi-medical, CKD stage III, diabetes mellitus and hypertension.  Per chart review, lives with boyfriend, 60 year old daughter.  Independent prior to admission.  Presented  03/25/2018 with seizure requiring intubation for airway protection.  Noted systolic blood pressure in the 200s as well as elevated blood sugars.  CT scan unremarkable for acute process.  CT angiogram head and neck with no dissection, aneurysm or  stenosis.  Urine drug screen negative.  MRI of the brain showed indistinct T2 hyperintensity in the pons that progressed from 07/25/2017.  The patient with fluctuating pontine signals compared to study 2018-2019 favoring atypical PRES.  Latest EEG  showing a mixture of theta and alpha rhythm seen from the central and temporal regions, but did not record any clinical or subclinical seizures.  Maintained on Keppra for seizure prophylaxis, Plavix for CVA prophylaxis, subcutaneous heparin for DVT  prophylaxis.  The patient was admitted for a comprehensive rehabilitation program.  PAST MEDICAL HISTORY:  See discharge diagnoses.  SOCIAL HISTORY:  Lives with her boyfriend.  Independent prior to admission.  FUNCTIONAL STATUS:  Upon admission to rehab  services was moderate assist of 150 feet rolling walker, minimal assist sit to stand, min mod assist with activities of daily living.  PHYSICAL EXAMINATION: VITAL SIGNS:  Blood pressure 152/72, pulse 85, temperature 99, respirations 18. GENERAL:  Alert female in no acute distress.  Mood was flat, but appropriate. HEENT:  EOMs intact. NECK:  Supple, nontender, no JVD. CARDIOVASCULAR:  Rate controlled. ABDOMEN:  Soft, nontender, good bowel sounds. LUNGS:  Clear to auscultation without wheeze.  REHABILITATION HOSPITAL COURSE:  The patient was admitted to inpatient rehab services.  Therapies initiated on a 3-hour daily basis, consisting of physical therapy, occupational therapy and rehabilitation nursing.  The following issues were addressed  during patient's rehabilitation stay.  Pertaining to the patient's decreased functional mobility felt to be related to seizures, uncontrolled hypertension, PRES, she continued to participate with her therapies.  Close monitoring of blood pressure with  Norvasc, Coreg, hydralazine.  She had been on Cardura, increased to 4 mg.  She would follow up with her primary MD.  She remained on Keppra for seizure prophylaxis.  EEG negative.  Hemoglobin A1c 12.5.  Lantus insulin as directed.  Full diabetic  teaching.  CKD stage III, creatinine 2.34.  She did receive IV fluids x2 nights, completed 10/31.  Echocardiogram:  Ejection fraction 60%.  Acute on chronic anemia, hemoglobin of 8.  Hemoccult stool negative.  Morbid obesity.  Dietary followup.  A chest  x-ray did show a left lower lobe infiltrate, afebrile.  Levaquin completed.  Weaned from oxygen.  Her Lasix remained ongoing of 40 mg daily.  The patient received weekly collaborative interdisciplinary team conferences to discuss estimated length of  stay, family teaching, any barriers to discharge.  Performed stand-pivot transfers to wheelchair supervision assist.  Self-propelled her wheelchair, car transfers, stand-pivot  wheelchair to SUV level, supervision.  Ambulates 178 feet rolling walker up  and down stairs, contact guard assist.  Gathered belongings for activities of daily living and homemaking.  Full family teaching was completed and planned discharge to home.  DISCHARGE MEDICATIONS:  Included Norvasc 10 mg p.o. daily, Lipitor 40 mg p.o. daily, Coreg 25 mg p.o. b.i.d., Plavix 75 mg p.o. daily, Cardura 4 mg p.o. daily, Flonase 1 spray each nostril daily, Lasix 40 mg p.o. daily, hydralazine 100 mg every 6 hours,  Lantus insulin 20 units at bedtime, Keppra 500 mg p.o. b.i.d., Claritin 10 mg p.o. daily, Protonix 40 mg p.o. daily, oxycodone 5 mg every 4 hours as needed for pain.  DIET:  Diabetic diet.  She would follow up with Dr. Delice Lesch at the outpatient rehab service office as directed; Dr. Skeet Latch, call for appointment, [place] [facility]; Ambulatory referral, Mena Pauls, medical management.  SPECIAL INSTRUCTIONS:  No driving.  No alcohol.  No smoking.  LN/NUANCE D:04/12/2018 T:04/12/2018 JOB:[id number]

## 2018-04-12 NOTE — Progress Notes (Signed)
[place] PHYSICAL MEDICINE & REHABILITATION PROGRESS NOTE  Subjective/Complaints: Patient seen laying in bed this morning.  She states she slept well overnight.  She states she feels better.  She states she is excited about her discharge tomorrow.  ROS: Denies CP, SOB, N/V/D  Objective: Vital Signs: Blood pressure (!) 154/73, pulse 87, temperature 98.9 F (37.2 C), resp. rate 18, height 5\' 3"  (1.6 m), weight 99.3 kg, SpO2 95 %. No results found. Recent Labs    04/10/18 0654 04/12/18 0531  WBC 7.9 6.5  HGB 8.1* 8.5*  HCT 26.0* 26.8*  PLT 346 326   Recent Labs    04/10/18 0654 04/12/18 0531  NA 138 137  K 3.4* 3.9  CL 111 112*  CO2 20* 20*  GLUCOSE 125* 148*  BUN 41* 38*  CREATININE 2.34* 2.27*  CALCIUM 8.4* 8.6*    Physical Exam: BP (!) 154/73 (BP Location: Right Arm)   Pulse 87   Temp 98.9 F (37.2 C)   Resp 18   Ht 5\' 3"  (1.6 m)   Wt 99.3 kg   SpO2 95%   BMI 38.79 kg/m  Constitutional: No distress . Vital signs reviewed. HENT: Normocephalic.  Atraumatic. Eyes: EOMI. No discharge. Cardiovascular: RRR.  No JVD. Respiratory: CTA bilaterally.  Normal effort. Upper respiratory congestion, improving GI: BS +. Non-distended. Musc: No edema or tenderness in extremities. Musculoskeletal: She exhibits edema LE, improving.  Neurological:Patient is alert Follows simple commands. Motor: 5/5, stable Psychiatric: Flat, although improved this morning.  Assessment/Plan: 1. Functional deficits secondary to seizures which require 3+ hours per day of interdisciplinary therapy in a comprehensive inpatient rehab setting.  Physiatrist is providing close team supervision and 24 hour management of active medical problems listed below.  Physiatrist and rehab team continue to assess barriers to discharge/monitor patient progress toward functional and medical goals  Care Tool:  Bathing  Bathing activity did not occur: (declined) Body parts bathed by patient: Right arm,  Right upper leg, Left arm, Left upper leg, Chest, Right lower leg, Abdomen, Front perineal area, Left lower leg, Face, Buttocks         Bathing assist Assist Level: Supervision/Verbal cueing     Upper Body Dressing/Undressing Upper body dressing   What is the patient wearing?: Pull over shirt    Upper body assist Assist Level: Set up assist    Lower Body Dressing/Undressing Lower body dressing      What is the patient wearing?: Pants, Incontinence brief     Lower body assist Assist for lower body dressing: Supervision/Verbal cueing     Toileting Toileting    Toileting assist Assist for toileting: Supervision/Verbal cueing     Transfers Chair/bed transfer  Transfers assist     Chair/bed transfer assist level: Supervision/Verbal cueing Chair/bed transfer assistive device: Programmer, multimedia   Ambulation assist      Assist level: Supervision/Verbal cueing Assistive device: Walker-rolling Max distance: 16ft   Walk 10 feet activity   Assist     Assist level: Supervision/Verbal cueing Assistive device: Walker-rolling   Walk 50 feet activity   Assist    Assist level: Supervision/Verbal cueing Assistive device: Walker-rolling    Walk 150 feet activity   Assist Walk 150 feet activity did not occur: Safety/medical concerns  Assist level: Supervision/Verbal cueing Assistive device: Walker-rolling    Walk 10 feet on uneven surface  activity   Assist Walk 10 feet on uneven surfaces activity did not occur: Safety/medical concerns   Assist level: Contact Guard/Touching  assist Assistive device: Aeronautical engineer Will patient use wheelchair at discharge?: No Type of Wheelchair: Manual    Wheelchair assist level: Supervision/Verbal cueing Max wheelchair distance: 160ft    Wheelchair 50 feet with 2 turns activity    Assist        Assist Level: Supervision/Verbal cueing   Wheelchair 150 feet  activity     Assist            Medical Problem List and Plan: 1.Decreased functional abilitysecondary to seizures thought to be secondary to uncontrolled hypertension/PRESversus hypoglycemia  Continue CIR   Plan for d/c tomorrow  Will see patient for transitional care management in 1-2 weeks post-discharge 2. DVT Prophylaxis/Anticoagulation: Subcutaneous heparin. Monitor for any bleeding episodes 3. Pain Management:Oxycodone as needed 4. Mood:Provide emotional support 5. Neuropsych: This patientis ?fullycapable of making decisions on herown behalf. 6. Skin/Wound Care:Routine skin checks, encourage appropriate nutrition 7. Fluids/Electrolytes/Nutrition:Routine in and outs  8.Seizure disorder. Keppra 500 mg twice daily 9.Hypertension. Norvasc 10 mg daily, Coreg 25 mg twice daily, hydralazine 100 mg every 8 hours  Cardura increased to 4 mg on 10/26  Labile on 11/1  Will need ambulatory follow-up and adjustments 10.Diabetes mellitus with peripheral neuropathy. Hemoglobin A1c 12.5. SSI.  Patient on Tresiba 60 units daily prior to admission.Resume as needed. Diabetic teaching  Lantus 10 nightly started on 10/24, increased to 14u 10/26, increased to 20 units on 10/29  Cont to monitor  Labile on 11/1 11.CKD stage III. Monitor urine output  Creatinine 2.27 on 11/1  IVF x2 nights completed on 10/31, echo reviewed EF to 55-60%  Encourage fluids 12.Asthma. Continue nebulizers as directed. 13.Hyperlipidemia. Lipitor 14.  Hypoalbuminemia  Supplement initiated on 10/23 15.  Acute on chronic anemia  Hemoglobin 8.5 on 11/1  Hemoccult negative  Continue to monitor 16.  Morbid obesity  Encourage weight loss 17.  Candidiasis  Diflucan x1 ordered on 10/24 18. Pneumonia  Chest x-ray reviewed- LLL infiltrate with effusion, Levaquin course completed  Weaned supplemental oxygen 19. Nasal congestion  Continue mucinex, flonase  Afrin d/ced on  10/28  Encourage IS  Claritin started on 10/28  Improving 20.  Hypokalemia  Supplemented x2 days  Potassium 3.9 on 11/1 21.  Pleural effusion  Lasix 40 daily started on 10/24   LOS: 10 days A FACE TO FACE EVALUATION WAS PERFORMED  Christina Orozco Christina Orozco 04/12/2018, 9:05 AM

## 2018-04-12 NOTE — Progress Notes (Signed)
Physical Therapy Discharge Summary  Patient Details  Name: Christina Orozco MRN: [id number] Date of Birth: 04/13/1971  Today's Date: 04/12/2018 PT Individual Time: 1300-1400 and 1530-1615 PT Individual Time Calculation (min): 60 min and 45 min   Patient has met 5 of 5 long term goals due to improved activity tolerance, improved balance, improved postural control, increased strength and increased range of motion.  Patient to discharge at an ambulatory level Modified Independent.   Patient's care partner is independent to provide the necessary physical assistance at discharge.  Reasons goals not met: All PT goals met.  Recommendation:  Patient will benefit from ongoing skilled PT services in home health setting to continue to advance safe functional mobility, address ongoing impairments in balance, strength, endurance, postural control, gait, and minimize fall risk.  Equipment: RW  Reasons for discharge: treatment goals met and discharge from hospital  Patient/family agrees with progress made and goals achieved: Yes   PT Treatment: Session One: Pt instructed in grad day assessment and measured progress towards goals. Performed car transfer at a modified independent level with RW. See below for more details.   Session Two: Reviewed pt HEP exercises. PT provided verbal cues for proper technique and safety. Pt demonstrated exercises and verbalized understanding.  Gait training ~200 ft with RW at mod I level in gift shop/atrium to simulate community ambulation environment. Self propelled WC ~150 ft with set up assist.  Ended session with pt supine in bed, call bell within reach, and all needs met.  PT Discharge Precautions/Restrictions   Vital Signs Therapy Vitals Temp: 97.9 F (36.6 C) Temp Source: Oral Pulse Rate: 85 Resp: 20 BP: 130/69 Patient Position (if appropriate): Lying Oxygen Therapy SpO2: 99 % O2 Device: Room Air Pain Pain Assessment Pain Scale: 0-10 Pain  Score: 0-No pain Vision/Perception  Vision - Assessment Tracking/Visual Pursuits: Decreased smoothness of horizontal tracking;Decreased smoothness of eye movement to LEFT superior field Saccades: Overshoots(mild overshooting ) Convergence: Within functional limits Perception Perception: Within Functional Limits Praxis Praxis: Intact  Cognition Overall Cognitive Status: Within Functional Limits for tasks assessed Sensation Sensation Light Touch: Appears Intact Proprioception: Appears Intact Coordination Gross Motor Movements are Fluid and Coordinated: Yes Heel Shin Test: [facility] Motor  Motor Motor: Within Functional Limits Motor - Discharge Observations: general improvements in LE strength from eval  Mobility Bed Mobility Bed Mobility: Rolling Right;Rolling Left;Supine to Sit;Sit to Supine Rolling Right: Independent Rolling Left: Independent Supine to Sit: Independent Sit to Supine: Independent Transfers Transfers: Sit to [employer] Transfers Sit to Stand: Independent with assistive device Stand to Sit: Independent with assistive device Stand Pivot Transfers: Independent with assistive device Transfer (Assistive device): Rolling walker Locomotion  Gait Gait Distance (Feet): 100 Feet Assistive device: Rolling walker Gait Gait Pattern: Within Functional Limits Gait velocity: decreased Stairs / Additional Locomotion Stairs: Yes Stairs Assistance: Supervision/Verbal cueing Stair Management Technique: Two rails;Step to pattern Number of Stairs: 12 Height of Stairs: 6 Ramp: Supervision/Verbal cueing Wheelchair Mobility Wheelchair Mobility: Yes Wheelchair Assistance: Set up Lexicographer: Both upper extremities Wheelchair Parts Management: Independent Distance: 150  Trunk/Postural Assessment  Cervical Assessment Cervical Assessment: Exceptions to WFL(forward head) Thoracic Assessment Thoracic Assessment: Exceptions to WFL(rounded  shoulders) Lumbar Assessment Lumbar Assessment: Exceptions to WFL(posterior pelvic tilt) Postural Control Postural Control: Deficits on evaluation Righting Reactions: slightly delayed; improved from time of eval  Balance Balance Balance Assessed: Yes Standardized Balance Assessment Standardized Balance Assessment: Berg Balance Test Berg Balance Test Sit to Stand: Able to stand without using hands and stabilize  independently Standing Unsupported: Able to stand safely 2 minutes Sitting with Back Unsupported but Feet Supported on Floor or Stool: Able to sit safely and securely 2 minutes Stand to Sit: Sits safely with minimal use of hands Transfers: Able to transfer safely, definite need of hands Standing Unsupported with Eyes Closed: Able to stand 10 seconds safely Standing Ubsupported with Feet Together: Able to place feet together independently and stand 1 minute safely From Standing, Reach Forward with Outstretched Arm: Can reach confidently >25 cm (10") From Standing Position, Pick up Object from Floor: Able to pick up shoe safely and easily From Standing Position, Turn to Look Behind Over each Shoulder: Looks behind from both sides and weight shifts well Turn 360 Degrees: Able to turn 360 degrees safely but slowly Standing Unsupported, Alternately Place Feet on Step/Stool: Able to stand independently and complete 8 steps >20 seconds Standing Unsupported, One Foot in Front: Able to take small step independently and hold 30 seconds Standing on One Leg: Tries to lift leg/unable to hold 3 seconds but remains standing independently Total Score: 47 Static Sitting Balance Static Sitting - Level of Assistance: 7: Independent Dynamic Sitting Balance Dynamic Sitting - Level of Assistance: 7: Independent Static Standing Balance Static Standing - Level of Assistance: 7: Independent Dynamic Standing Balance Dynamic Standing - Level of Assistance: 6: Modified independent (Device/Increase  time)(with RW) Extremity Assessment      RLE Assessment RLE Assessment: Exceptions to [facility] General Strength Comments: global LE strength improved since eval RLE Strength Right Hip Flexion: 4-/5 Right Knee Flexion: 4-/5 Right Knee Extension: 5/5 Right Ankle Dorsiflexion: 4+/5 LLE Assessment LLE Assessment: Exceptions to [facility] General Strength Comments: global LE strength improved since eval LLE Strength Left Hip Flexion: 4-/5 Left Knee Flexion: 4-/5 Left Knee Extension: 5/5 Left Ankle Dorsiflexion: 4+/5    Parisa Pinela 04/12/2018, 4:35 PM

## 2018-04-12 NOTE — Progress Notes (Signed)
Occupational Therapy Session Note  Patient Details  Name: Christina Orozco MRN: [id number] Date of Birth: [birth year]  Today's Date: 04/12/2018 OT Individual Time: 1130-1201 OT Individual Time Calculation (min): 31 min    Short Term Goals: Week 1:  OT Short Term Goal 1 (Week 1): Pt will maintain standing balance during LB dressing with CGA.  OT Short Term Goal 2 (Week 1): Pt will perform toilet/BSC transfer with CGA and LRAD.  OT Short Term Goal 3 (Week 1): Pt will perform 3/3 steps of LB dressing.  OT Short Term Goal 4 (Week 1): Pt will require no more than 1 safety cue during ADL task completion.   Skilled Therapeutic Interventions/Progress Updates:    Pt completed transfer from bed to wheelchair with modified independent using the RW.  Therapist then took her down to the [facility] gym for work on dynamic standing balance with use of the Dynavision.  She demonstrated occasional LOB when attempting to step on and off of the foam surface therapist had her standing on during use.  She was able to demonstrate appropriate reaction time to lights, at around one second during use with occasional need for UE support on the Dynavision to regain balance as well.  Had pt work on standing on the foam without use of Dynavision while keeping eyes closed and no LOB noted.  She also was able to stand on the foam and complete small head turns in all directions without LOB.  Finished session with return to the room with pt transferring back to the EOB with modified independence.  Call button and phone in reach with RW in reach as well since pt is now modified independent in her room.   Therapy Documentation Precautions:  Precautions Precautions: Fall Precaution Comments: seizure Restrictions Weight Bearing Restrictions: No  Pain: Pain Assessment Pain Score: 0-No pain    Therapy/Group: Individual Therapy  Lary Eckardt OTR/L 04/12/2018, 12:32 PM

## 2018-04-12 NOTE — Progress Notes (Signed)
Occupational Therapy Discharge Summary  Patient Details  Name: Christina Orozco MRN: [id number] Date of Birth: Nov 08, 1970   Patient has met 12 of 12 long term goals due to improved activity tolerance, improved balance, postural control, ability to compensate for deficits, functional use of  RIGHT upper, RIGHT lower, LEFT upper and LEFT lower extremity, improved attention, improved awareness and improved coordination.  Patient to discharge at overall Modified Independent level.  Patient's care partner is independent to provide the necessary physical assistance at discharge.    Reasons goals not met: n/a - LTG of meal prep discontinued as pt declined to work on those tasks and declines to do them at home.  Recommendation:  Patient will benefit from ongoing skilled OT services in home health setting to continue to advance functional skills in the area of iADL.  Equipment: No equipment provided for OT as pt does not have insurance coverage and pt states she cannot afford a bench.   Recommended pt look for a tub bench at a thrift store.   Prescription provided to pt for a hand held shower and small grab bar to go in front tub wall,  Reasons for discharge: treatment goals met  Patient/family agrees with progress made and goals achieved: Yes  OT Discharge ADL ADL Eating: Independent Grooming: Independent Upper Body Bathing: Independent Where Assessed-Upper Body Bathing: Shower Lower Body Bathing: Modified independent Where Assessed-Lower Body Bathing: Shower Upper Body Dressing: Independent Where Assessed-Upper Body Dressing: Edge of bed Lower Body Dressing: Independent Where Assessed-Lower Body Dressing: Edge of bed Toileting: Independent Where Assessed-Toileting: Risk analyst Method: Human resources officer: Modified independent Clinical cytogeneticist Method: Optometrist: Civil engineer, contracting: Environmental education officer Method: Radiographer, therapeutic: Radio broadcast assistant, Grab bars Vision Baseline Vision/History: Wears glasses Patient Visual Report: Blurring of vision(Pt reports the level of blurriness is less.) Convergence: Impaired (comment)(pt continues to have decrease L eye convergence) Perception  Perception: Within Functional Limits Praxis Praxis: Intact Cognition Overall Cognitive Status: Within Functional Limits for tasks assessed Sensation Sensation Light Touch: Appears Intact Hot/Cold: Appears Intact Proprioception: Appears Intact Stereognosis: Appears Intact Coordination Fine Motor Movements are Fluid and Coordinated: Yes Finger Nose Finger Test: WFL on R 13x in 10 sec, L side 10x in 10sec with dysmetria as she was touching her nose Motor  Motor Motor: Within Functional Limits Mobility    mod I with RW for bathroom transfers Trunk/Postural Assessment    pt continues to have decreased trunk/postural control with dynamic standing and ambulation, therefore she uses the RW  Balance Static Sitting Balance Static Sitting - Level of Assistance: 7: Independent Dynamic Sitting Balance Dynamic Sitting - Level of Assistance: 7: Independent Static Standing Balance Static Standing - Level of Assistance: 7: Independent Dynamic Standing Balance Dynamic Standing - Level of Assistance: 6: Modified independent (Device/Increase time) Extremity/Trunk Assessment RUE Assessment RUE Assessment: Within Functional Limits General Strength Comments: 5/5 LUE Assessment LUE Assessment: Within Functional Limits General Strength Comments: 5/5   SAGUIER,JULIA 04/12/2018, 10:55 AM

## 2018-04-12 NOTE — Plan of Care (Signed)
  Problem: Consults Goal: RH GENERAL PATIENT EDUCATION Description See Patient Education module for education specifics. Outcome: Progressing Goal: Diabetes Guidelines if Diabetic/Glucose > 140 Description If diabetic or lab glucose is > 140 mg/dl - Initiate Diabetes/Hyperglycemia Guidelines & Document Interventions  Outcome: Progressing   Problem: RH BLADDER ELIMINATION Goal: RH STG MANAGE BLADDER WITH ASSISTANCE Description STG Manage Bladder With mod I Assistance  Outcome: Progressing Flowsheets (Taken 04/12/2018 1557) STG: Pt will manage bladder with assistance: 6-Modified independent   Problem: RH SKIN INTEGRITY Goal: RH STG MAINTAIN SKIN INTEGRITY WITH ASSISTANCE Description STG Maintain Skin Integrity With mod I Assistance.  Outcome: Progressing Flowsheets (Taken 04/12/2018 1557) STG: Maintain skin integrity with assistance: 6-Modified independent

## 2018-04-12 NOTE — Discharge Summary (Signed)
Discharge summary job [phone]

## 2018-04-13 LAB — GLUCOSE, CAPILLARY: Glucose-Capillary: 79 mg/dL (ref 70–99)

## 2018-04-13 NOTE — Progress Notes (Signed)
Patient discharged to home per wheelchair accompanied by NT and family; RN went over her discharge instructions one more time before discharge per request. No further questions noted.

## 2018-04-15 ENCOUNTER — Telehealth: Payer: Self-pay | Admitting: Registered Nurse

## 2018-04-15 NOTE — Progress Notes (Signed)
Social Work  Discharge Note  The overall goal for the admission was met for:   Discharge location: Yes - home  Length of Stay: Yes - 11 days  Discharge activity level: Yes - mod independent overall  Home/community participation: Yes  Services provided included: MD, RD, PT, OT, SLP, RN, TR, Pharmacy, Neuropsych and SW  Financial Services: Private Insurance: BCBs  Follow-up services arranged: Home Health: PT, OT via [place], DME: rolling walker via [place] and Patient/Family has no preference for HH/DME agencies  Comments (or additional information):  Patient/Family verbalized understanding of follow-up arrangements: Yes  Individual responsible for coordination of the follow-up plan: pt  Confirmed correct DME delivered: Shontel Santee 04/15/2018    Hillary Struss

## 2018-04-15 NOTE — Telephone Encounter (Signed)
Transitional Care call  Transitional Care Call Completed  Patient name: Christina Orozco DOB: 09/22/1970 1. Are you/is patient experiencing any problems since coming home? No a. Are there any questions regarding any aspect of care? No 2. Are there any questions regarding medications administration/dosing? No a. Are meds being taken as prescribed? Yes b. "Patient should review meds with caller to confirm" Medication List Reviewed 3. Have there been any falls? No 4. Has Home Health been to the house and/or have they contacted you? Yes, [facility]. a. If not, have you tried to contact them? NA b. Can we help you contact them? NA 5. Are bowels and bladder emptying properly? Yes, at times periods of constipation. Encouraged Healthy Diet Regimen.  a. Are there any unexpected incontinence issues? NA b. If applicable, is patient following bowel/bladder programs? NA 6. Any fevers, problems with breathing, unexpected pain? Complaining of Rib Pain she reports it began on Saturday 04/13/2018, she also has a cough, most likely muscle pain related to coughing. Encouraged to continue to monitor and call office if no changes are noted, she verbalizes understanding.  7. Are there any skin problems or new areas of breakdown? No 8. Has the patient/family member arranged specialty MD follow up (ie cardiology/neurology/renal/surgical/etc.)?  Yes, all but the appointment with her Cardiologist, she reports she will call them on tomorrow. She was encouraged to call and verbalizes understanding.  a. Can we help arrange? NA 9. Does the patient need any other services or support that we can help arrange? No 10. Are caregivers following through as expected in assisting the patient? Yes 11. Has the patient quit smoking, drinking alcohol, or using drugs as recommended? [name] denies smoking, drinking alcohol or using illicit drugs.   Appointment date/time 04/26/2018  arrival time 9:40 for 10:00 appointment  with Dr. Posey Pronto. At [place]

## 2018-04-16 ENCOUNTER — Telehealth: Payer: Self-pay | Admitting: *Deleted

## 2018-04-16 NOTE — Telephone Encounter (Signed)
Merry Proud, PT, [facility] left a message asking for verbal orders for home health physical therapy 2week2 followed by 1week2 to address lower extremity strength, gait, balance, and transfer training.  Medical record reviewed. Social work note reviewed.  Verbal orders given per office protocol.

## 2018-04-23 ENCOUNTER — Encounter: Payer: Self-pay | Admitting: Family Medicine

## 2018-04-23 ENCOUNTER — Ambulatory Visit (INDEPENDENT_AMBULATORY_CARE_PROVIDER_SITE_OTHER): Payer: BLUE CROSS/BLUE SHIELD | Admitting: Family Medicine

## 2018-04-23 ENCOUNTER — Ambulatory Visit: Payer: BLUE CROSS/BLUE SHIELD | Admitting: Family Medicine

## 2018-04-23 VITALS — BP 144/80 | HR 78 | Temp 98.0°F | Ht 63.0 in | Wt 197.0 lb

## 2018-04-23 DIAGNOSIS — F411 Generalized anxiety disorder: Secondary | ICD-10-CM

## 2018-04-23 DIAGNOSIS — R569 Unspecified convulsions: Secondary | ICD-10-CM | POA: Diagnosis not present

## 2018-04-23 DIAGNOSIS — N183 Chronic kidney disease, stage 3 unspecified: Secondary | ICD-10-CM

## 2018-04-23 DIAGNOSIS — F430 Acute stress reaction: Secondary | ICD-10-CM

## 2018-04-23 DIAGNOSIS — Z794 Long term (current) use of insulin: Secondary | ICD-10-CM

## 2018-04-23 DIAGNOSIS — E1122 Type 2 diabetes mellitus with diabetic chronic kidney disease: Secondary | ICD-10-CM | POA: Diagnosis not present

## 2018-04-23 DIAGNOSIS — Z8673 Personal history of transient ischemic attack (TIA), and cerebral infarction without residual deficits: Secondary | ICD-10-CM | POA: Diagnosis not present

## 2018-04-23 DIAGNOSIS — I119 Hypertensive heart disease without heart failure: Secondary | ICD-10-CM | POA: Diagnosis not present

## 2018-04-23 DIAGNOSIS — Z09 Encounter for follow-up examination after completed treatment for conditions other than malignant neoplasm: Secondary | ICD-10-CM

## 2018-04-23 DIAGNOSIS — G4700 Insomnia, unspecified: Secondary | ICD-10-CM

## 2018-04-23 NOTE — Progress Notes (Signed)
Hospital Follow Up  Subjective:    Patient ID: Christina Orozco, female    DOB: 1970-08-02, 47 y.o.   MRN: [id number]  Chief Complaint  Patient presents with  . Hospitalization Follow-up   HPI Christina Orozco is a 47 year old female with a past medical history of Stroke, Neuropathy, Hypertension, GERD, Dislocated Shoulder, Diabetes, DDD, CKD, Asthma, and Anemia. She is here today for follow up.   Current Status: Since her last office visit, she is doing well with no complaints. She was recently hospitalized from 03/25/2018-04/13/2018, for multiple seizures, r/t. She was transferred to ICU, then eventually to [facility]. She arrives with the assistance of a rolling walker. She is s/p Stroke in 2018 and most recent one was 07/2017. She had appointment with Optometry on 05/03/2018 for possible surgery of left eye. She experienced some unsteadiness yesterday, with a fall. PT is coming to home twice week. Her balance is off. Situational and health status anxiety is very high today. She is very tearful today.   She denies fevers, chills, fatigue, recent infections, weight loss, and night sweats. She has not had any and falls. No chest pain, heart palpitations, cough and shortness of breath reported. No reports of GI problems such as nausea, vomiting, and diarrhea. She has no reports of blood in stools, dysuria and hematuria. No depression or anxiety reported.  Past Medical History:  Diagnosis Date  . Anemia    severe  . Asthma   . CKD (chronic kidney disease)    stage IV  . DDD (degenerative disc disease), cervical   . Diabetes mellitus ([place])   . Dislocated shoulder    right  . GERD (gastroesophageal reflux disease)   . Hypertension   . Neuropathy   . Stroke [facility])     Family History  Problem Relation Age of Onset  . Vascular Disease Mother   . CAD Mother   . Heart failure Mother   . Heart disease Unknown   . Cancer Unknown        colon, stomach, lung  . Diabetes Unknown   . Seizures  Unknown   . Breast cancer Sister 20    Social History   Socioeconomic History  . Marital status: Single    Spouse name: Not on file  . Number of children: 3  . Years of education: 12+  . Highest education level: Not on file  Occupational History  . Not on file  Social Needs  . Financial resource strain: Not on file  . Food insecurity:    Worry: Not on file    Inability: Not on file  . Transportation needs:    Medical: Not on file    Non-medical: Not on file  Tobacco Use  . Smoking status: Never Smoker  . Smokeless tobacco: Never Used  Substance and Sexual Activity  . Alcohol use: No  . Drug use: No  . Sexual activity: Not on file  Lifestyle  . Physical activity:    Days per week: Not on file    Minutes per session: Not on file  . Stress: Not on file  Relationships  . Social connections:    Talks on phone: Not on file    Gets together: Not on file    Attends religious service: Not on file    Active member of club or organization: Not on file    Attends meetings of clubs or organizations: Not on file    Relationship status: Not on file  . Intimate  partner violence:    Fear of current or ex partner: Not on file    Emotionally abused: Not on file    Physically abused: Not on file    Forced sexual activity: Not on file  Other Topics Concern  . Not on file  Social History Narrative   Lives with daughter   Caffeine use: 3 cups daily   Right handed    Past Surgical History:  Procedure Laterality Date  . ADENOIDECTOMY    . APPENDECTOMY    . CERVICAL SPINE SURGERY  06/2012   C5-C6  . EYE SURGERY     left eye surgery   . FOOT SURGERY    . LOOP RECORDER INSERTION N/A 08/27/2017   Procedure: LOOP RECORDER INSERTION;  Surgeon: Sanda Klein, MD;  Location: [place] [facility];  Service: Cardiovascular;  Laterality: N/A;  . SHOULDER SURGERY    . TEE WITHOUT CARDIOVERSION N/A 08/27/2017   Procedure: TRANSESOPHAGEAL ECHOCARDIOGRAM (TEE);  Surgeon: Sanda Klein,  MD;  Location: [place];  Service: Cardiovascular;  Laterality: N/A;  . TONSILLECTOMY      Immunization History  Administered Date(s) Administered  . Influenza,inj,Quad PF,6+ Mos 05/05/2015, 03/23/2016, 04/23/2017, 04/03/2018  . Pneumococcal Polysaccharide-23 05/05/2015    Current Meds  Medication Sig  . acetaminophen (TYLENOL) 325 MG tablet Take 2 tablets (650 mg total) by mouth every 6 (six) hours as needed for mild pain, fever or headache.  . albuterol (PROAIR HFA) 108 (90 Base) MCG/ACT inhaler INHALE 1 OR 2 PUFFS INTO THE LUNGS EVERY 6 HOURS AS NEEDED FOR WHEEZING OR SHORTNESS OF BREATH  . amLODipine (NORVASC) 10 MG tablet Take 1 tablet (10 mg total) by mouth daily.  Marland Kitchen atorvastatin (LIPITOR) 40 MG tablet TAKE 1 TABLET BY MOUTH EVERY EVENING AT 6PM  . carvedilol (COREG) 25 MG tablet Take 1 tablet (25 mg total) by mouth 2 (two) times daily with a meal. Take 25 mg twice daily  . clopidogrel (PLAVIX) 75 MG tablet Take 1 tablet (75 mg total) by mouth daily.  Marland Kitchen dextromethorphan-guaiFENesin (MUCINEX DM) 30-600 MG 12hr tablet Take 1 tablet by mouth 2 (two) times daily.  Marland Kitchen doxazosin (CARDURA) 4 MG tablet Take 1 tablet (4 mg total) by mouth daily.  . ferrous sulfate 325 (65 FE) MG tablet Take 1 tablet (325 mg total) by mouth daily.  . furosemide (LASIX) 40 MG tablet Take 1 tablet (40 mg total) by mouth daily.  Marland Kitchen glucose blood (CONTOUR NEXT TEST) test strip 1 each by Other route 2 (two) times daily. And lancets 2/day  . guaiFENesin (ROBITUSSIN) 100 MG/5ML SOLN Take 5 mLs (100 mg total) by mouth every 4 (four) hours as needed for cough or to loosen phlegm.  . hydrALAZINE (APRESOLINE) 100 MG tablet Take 1 tablet (100 mg total) by mouth every 6 (six) hours.  . insulin glargine (LANTUS) 100 unit/mL SOPN Inject 0.2 mLs (20 Units total) into the skin at bedtime.  . levETIRAcetam (KEPPRA) 500 MG tablet Take 1 tablet (500 mg total) by mouth 2 (two) times daily.  Marland Kitchen loratadine (CLARITIN) 10 MG tablet  Take 1 tablet (10 mg total) by mouth daily.  Marland Kitchen oxyCODONE (OXY IR/ROXICODONE) 5 MG immediate release tablet Take 1 tablet (5 mg total) by mouth every 4 (four) hours as needed for moderate pain.  . pantoprazole (PROTONIX) 40 MG tablet Take 1 tablet (40 mg total) by mouth daily.    Allergies  Allergen Reactions  . Lisinopril Swelling and Other (See Comments)    Facial  swelling   . Penicillins Hives    Has patient had a PCN reaction causing immediate rash, facial/tongue/throat swelling, SOB or lightheadedness with hypotension: yes Has patient had a PCN reaction causing severe rash involving mucus membranes or skin necrosis: No Has patient had a PCN reaction that required hospitalization No Has patient had a PCN reaction occurring within the last 10 years: No If all of the above answers are "NO", then may proceed wit  . Gabapentin Swelling and Other (See Comments)    "I thought I was having another stroke."     BP (!) 144/80 (BP Location: Left Arm, Patient Position: Sitting, Cuff Size: Large)   Pulse 78   Temp 98 F (36.7 C) (Oral)   Ht 5\' 3"  (1.6 m)   Wt 197 lb (89.4 kg)   SpO2 100%   BMI 34.90 kg/m    Review of Systems  Constitutional: Negative.   HENT: Negative.   Eyes: Positive for visual disturbance (left eye problems).  Respiratory: Negative.   Cardiovascular: Negative.   Gastrointestinal: Positive for constipation.  Endocrine: Negative.   Musculoskeletal: Negative.   Skin: Negative.   Neurological: Positive for dizziness (Frequently), seizures, weakness, light-headedness (Frequently) and headaches (Frequently).  Psychiatric/Behavioral: Positive for sleep disturbance (Insomnia). The patient is nervous/anxious.        Health status/ Situational anxiety.    Objective:   Physical Exam  Constitutional: She is oriented to person, place, and time. She appears well-developed and well-nourished.  HENT:  Head: Normocephalic and atraumatic.  Eyes: Pupils are equal, round, and  reactive to light. Conjunctivae and EOM are normal.  Neck: Normal range of motion. Neck supple.  Cardiovascular: Normal rate, regular rhythm, normal heart sounds and intact distal pulses.  Pulmonary/Chest: Effort normal and breath sounds normal.  Abdominal: Soft. Bowel sounds are normal.  Musculoskeletal: Normal range of motion.  Neurological: She is alert and oriented to person, place, and time.  Skin: Skin is warm and dry.  Psychiatric:  Very emotional. Tearful.   Nursing note and vitals reviewed.  Assessment & Plan:   1. Seizure ([place]) Stable. No reports since hospitalization. Continue Keppra as prescribed.  - POCT urinalysis dipstick  2. History of CVA (cerebrovascular accident)  3. Hypertensive heart disease without heart failure Blood pressure is 144/88 today. Continue Amlodipine, Carvedilol, Plavix, and Hydralazine as prescribed. She will continue to decrease high sodium intake, excessive alcohol intake, increase potassium intake, smoking cessation, and increase physical activity of at least 30 minutes of cardio activity daily. She will continue to follow Heart Healthy or DASH diet.  4. Type 2 diabetes mellitus with stage 3 chronic kidney disease, with long-term current use of insulin (HCC) Improved. Most recent Hgb A1c is at 8.3 on 11/09/2017, decreased from 12.5 on 07/26/2017.  Continue medications. She will continue to decrease foods/beverages high in sugars and carbs and follow Heart Healthy or DASH diet. Increase physical activity to at least 30 minutes cardio exercise daily.   5. Anxiety as acute reaction to exceptional stress High stress and anxiety today. She declines medication and referral to Psychiatry at this time.   6. Insomnia, unspecified type Stable. Not worsening. She does not want sleep aide at this time. Monitor.   7. Follow up She will follow up in 1 month.   No orders of the defined types were placed in this encounter.  Kathe Becton,  MSN,  FNP-C Patient [facility] [address] [place], [place] [address] [phone]

## 2018-04-25 ENCOUNTER — Telehealth: Payer: Self-pay | Admitting: *Deleted

## 2018-04-25 DIAGNOSIS — N184 Chronic kidney disease, stage 4 (severe): Secondary | ICD-10-CM

## 2018-04-25 DIAGNOSIS — E1142 Type 2 diabetes mellitus with diabetic polyneuropathy: Secondary | ICD-10-CM

## 2018-04-25 DIAGNOSIS — M5030 Other cervical disc degeneration, unspecified cervical region: Secondary | ICD-10-CM

## 2018-04-25 DIAGNOSIS — Z9119 Patient's noncompliance with other medical treatment and regimen: Secondary | ICD-10-CM

## 2018-04-25 DIAGNOSIS — Z794 Long term (current) use of insulin: Secondary | ICD-10-CM

## 2018-04-25 DIAGNOSIS — G40909 Epilepsy, unspecified, not intractable, without status epilepticus: Secondary | ICD-10-CM

## 2018-04-25 DIAGNOSIS — Z8673 Personal history of transient ischemic attack (TIA), and cerebral infarction without residual deficits: Secondary | ICD-10-CM

## 2018-04-25 DIAGNOSIS — I129 Hypertensive chronic kidney disease with stage 1 through stage 4 chronic kidney disease, or unspecified chronic kidney disease: Secondary | ICD-10-CM

## 2018-04-25 DIAGNOSIS — D631 Anemia in chronic kidney disease: Secondary | ICD-10-CM

## 2018-04-25 DIAGNOSIS — I6783 Posterior reversible encephalopathy syndrome: Secondary | ICD-10-CM

## 2018-04-25 DIAGNOSIS — E785 Hyperlipidemia, unspecified: Secondary | ICD-10-CM

## 2018-04-25 DIAGNOSIS — F419 Anxiety disorder, unspecified: Secondary | ICD-10-CM

## 2018-04-25 DIAGNOSIS — Z7902 Long term (current) use of antithrombotics/antiplatelets: Secondary | ICD-10-CM

## 2018-04-25 DIAGNOSIS — F329 Major depressive disorder, single episode, unspecified: Secondary | ICD-10-CM

## 2018-04-25 DIAGNOSIS — J45909 Unspecified asthma, uncomplicated: Secondary | ICD-10-CM

## 2018-04-25 DIAGNOSIS — E1122 Type 2 diabetes mellitus with diabetic chronic kidney disease: Secondary | ICD-10-CM

## 2018-04-25 LAB — FUNGUS CULTURE WITH STAIN

## 2018-04-25 LAB — FUNGAL ORGANISM REFLEX

## 2018-04-25 LAB — FUNGUS CULTURE RESULT

## 2018-04-25 NOTE — Telephone Encounter (Signed)
[facility] requested a MSW vist for assistance with financial issues and resources.  Approval given.

## 2018-04-26 ENCOUNTER — Encounter: Payer: Self-pay | Admitting: Physical Medicine & Rehabilitation

## 2018-04-26 ENCOUNTER — Other Ambulatory Visit: Payer: Self-pay

## 2018-04-26 ENCOUNTER — Encounter
Payer: BLUE CROSS/BLUE SHIELD | Attending: Physical Medicine & Rehabilitation | Admitting: Physical Medicine & Rehabilitation

## 2018-04-26 ENCOUNTER — Ambulatory Visit (INDEPENDENT_AMBULATORY_CARE_PROVIDER_SITE_OTHER): Payer: BLUE CROSS/BLUE SHIELD | Admitting: Physician Assistant

## 2018-04-26 ENCOUNTER — Encounter: Payer: Self-pay | Admitting: Physician Assistant

## 2018-04-26 VITALS — BP 153/72 | HR 73 | Ht 63.0 in | Wt 194.0 lb

## 2018-04-26 VITALS — BP 136/85 | HR 78 | Resp 16 | Ht 62.5 in | Wt 194.0 lb

## 2018-04-26 DIAGNOSIS — I10 Essential (primary) hypertension: Secondary | ICD-10-CM

## 2018-04-26 DIAGNOSIS — I129 Hypertensive chronic kidney disease with stage 1 through stage 4 chronic kidney disease, or unspecified chronic kidney disease: Secondary | ICD-10-CM | POA: Insufficient documentation

## 2018-04-26 DIAGNOSIS — N183 Chronic kidney disease, stage 3 unspecified: Secondary | ICD-10-CM

## 2018-04-26 DIAGNOSIS — E1142 Type 2 diabetes mellitus with diabetic polyneuropathy: Secondary | ICD-10-CM | POA: Insufficient documentation

## 2018-04-26 DIAGNOSIS — R5381 Other malaise: Secondary | ICD-10-CM

## 2018-04-26 DIAGNOSIS — R569 Unspecified convulsions: Secondary | ICD-10-CM | POA: Diagnosis not present

## 2018-04-26 DIAGNOSIS — Z8249 Family history of ischemic heart disease and other diseases of the circulatory system: Secondary | ICD-10-CM | POA: Diagnosis not present

## 2018-04-26 DIAGNOSIS — E1122 Type 2 diabetes mellitus with diabetic chronic kidney disease: Secondary | ICD-10-CM | POA: Insufficient documentation

## 2018-04-26 DIAGNOSIS — R519 Headache, unspecified: Secondary | ICD-10-CM | POA: Insufficient documentation

## 2018-04-26 DIAGNOSIS — G8929 Other chronic pain: Secondary | ICD-10-CM | POA: Insufficient documentation

## 2018-04-26 DIAGNOSIS — E119 Type 2 diabetes mellitus without complications: Secondary | ICD-10-CM

## 2018-04-26 DIAGNOSIS — Z8673 Personal history of transient ischemic attack (TIA), and cerebral infarction without residual deficits: Secondary | ICD-10-CM

## 2018-04-26 DIAGNOSIS — G40909 Epilepsy, unspecified, not intractable, without status epilepticus: Secondary | ICD-10-CM | POA: Diagnosis not present

## 2018-04-26 DIAGNOSIS — R51 Headache: Secondary | ICD-10-CM | POA: Diagnosis not present

## 2018-04-26 DIAGNOSIS — R262 Difficulty in walking, not elsewhere classified: Secondary | ICD-10-CM | POA: Insufficient documentation

## 2018-04-26 DIAGNOSIS — E1165 Type 2 diabetes mellitus with hyperglycemia: Secondary | ICD-10-CM

## 2018-04-26 DIAGNOSIS — R269 Unspecified abnormalities of gait and mobility: Secondary | ICD-10-CM

## 2018-04-26 NOTE — Progress Notes (Signed)
Subjective:    Patient ID: Christina Orozco, female    DOB: 05-08-1971, 47 y.o.   MRN: [id number]  HPI 47 year old right-handed female, multi-medical, CKD stage III, diabetes mellitus and hypertension presents for transitional care management after receiving CIR for debility.   DATE OF ADMISSION: 04/02/2018 DATE OF DISCHARGE: 04/13/2018   At discharge, she as instructed to follow up with Cards, which she states she did.  She sees Neurology in January.  She saw PCP.  She continues to take Keppra. BP is elevated. CBGs have been labile, per pt. She has not seen Endo. Had a fall when she became light headed, states she discussed with Cards.  Therapies: 2/week DME: Rolling walker Mobility: Rolling walker at all times.  Pain Inventory Average Pain 5 Pain Right Now 0 My pain is stabbing  In the last 24 hours, has pain interfered with the following? General activity 4 Relation with others 0 Enjoyment of life 0 What TIME of day is your pain at its worst? 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  no  Physicians involved in your care Any changes since last visit?  no   Family History  Problem Relation Age of Onset  . Vascular Disease Mother   . CAD Mother   . Heart failure Mother   . Heart disease Unknown   . Cancer Unknown        colon, stomach, lung  . Diabetes Unknown   . Seizures Unknown   . Breast cancer Sister 66   Social History   Socioeconomic History  . Marital status: Single    Spouse name: Not on file  . Number of children: 3  . Years of education: 12+  . Highest education  level: Not on file  Occupational History  . Not on file  Social Needs  . Financial resource strain: Not on file  . Food insecurity:    Worry: Not on file    Inability: Not on file  . Transportation needs:    Medical: Not on file    Non-medical: Not on file  Tobacco Use  . Smoking status: Never Smoker  . Smokeless tobacco: Never Used  Substance and Sexual Activity  . Alcohol use: No  . Drug use: No  . Sexual activity: Not on file  Lifestyle  . Physical activity:    Days per week: Not on file    Minutes per session: Not on file  . Stress: Not on file  Relationships  . Social connections:    Talks on phone: Not on file    Gets together: Not on file    Attends religious service: Not on file    Active member of club or organization: Not on file    Attends meetings of clubs or organizations: Not on file    Relationship status: Not on file  Other Topics Concern  . Not on file  Social History Narrative   Lives with daughter   Caffeine use: 3 cups daily   Right handed   Past Surgical History:  Procedure Laterality Date  . ADENOIDECTOMY    . APPENDECTOMY    . CERVICAL SPINE  SURGERY  06/2012   C5-C6  . EYE SURGERY     left eye surgery   . FOOT SURGERY    . LOOP RECORDER INSERTION N/A 08/27/2017   Procedure: LOOP RECORDER INSERTION;  Surgeon: Sanda Klein, MD;  Location: [place] [facility];  Service: Cardiovascular;  Laterality: N/A;  . SHOULDER SURGERY    . TEE WITHOUT CARDIOVERSION N/A 08/27/2017   Procedure: TRANSESOPHAGEAL ECHOCARDIOGRAM (TEE);  Surgeon: Sanda Klein, MD;  Location: [facility];  Service: Cardiovascular;  Laterality: N/A;  . TONSILLECTOMY     Past Medical History:  Diagnosis Date  . Anemia    severe  . Asthma   . CKD (chronic kidney disease)    stage IV  . DDD (degenerative disc disease), cervical   . Diabetes mellitus ([place])   . Dislocated shoulder    right  . GERD (gastroesophageal reflux disease)   . Hypertension   . Neuropathy   .  Stroke ([place])    BP (!) 153/72   Pulse 73   Ht 5\' 3"  (1.6 m)   Wt 194 lb (88 kg)   SpO2 96%   BMI 34.37 kg/m   Opioid Risk Score:   Fall Risk Score:  `1  Depression screen PHQ 2/9  Depression screen [facility] 2/9 45/15/2019 04/23/2018 04/23/2018 01/10/2018 08/31/2017 08/03/2017 04/23/2017  Decreased Interest 1 0 0 0 0 0 0  Down, Depressed, Hopeless 2 1 1  0 0 0 1  PHQ - 2 Score 3 1 1  0 0 0 1  Altered sleeping 2 - - - - - -  Tired, decreased energy 2 - - - - - -  Change in appetite 1 - - - - - -  Feeling bad or failure about yourself  3 - - - - - -  Trouble concentrating 0 - - - - - -  Moving slowly or fidgety/restless 0 - - - - - -  Suicidal thoughts 0 - - - - - -  PHQ-9 Score 11 - - - - - -  Difficult doing work/chores Somewhat difficult - - - - - -     Review of Systems  Constitutional: Positive for fever.  HENT: Negative.   Eyes: Negative.   Respiratory: Negative.   Cardiovascular: Positive for leg swelling.  Gastrointestinal: Positive for constipation.  Endocrine: Negative.   Genitourinary: Negative.   Musculoskeletal: Positive for gait problem.  Skin: Negative.   Allergic/Immunologic: Negative.   Neurological: Positive for headaches.  Hematological: Negative.   Psychiatric/Behavioral: Positive for confusion.  All other systems reviewed and are negative.     Objective:   Physical Exam Constitutional: No distress . Vital signs reviewed. HENT: Normocephalic.  Atraumatic. Eyes: EOMI. No discharge. Cardiovascular: RRR. No JVD. Respiratory: CTA bilaterally. Normal effort. GI: BS +. Non-distended. Musc: No edema or tenderness in extremities. Musculoskeletal: She exhibits mild edema. Neurological: Patient is alert Follows simple commands. Motor: 5/5 Psychiatric: Flat    Assessment & Plan:  47 year old right-handed female, multi-medical, CKD stage III, diabetes mellitus and hypertension presents for transitional care management after receiving CIR for debility.   1.   Decreased functional ability secondary to seizures thought to be secondary to uncontrolled hypertension/PRES versus hypoglycemia  Cont therapies  Follow up with Neurology/Endo  2.  Seizure disorder.    Cont meds  Follow up with Neurology  3. Hypertension.    Cont meds  Elevated, being adjusted by PCP  4.  Diabetes mellitus with peripheral neuropathy.    Labile per pt  Needs appointment with Endo  5. CKD  Needs appointment with Nephro  6. Headaches  Infrequent transient  Does not want medications  Encouraged diary  7. Abnormality of gait  Cont therapies  Cont walker for safety   Meds reviewed Referrals reviewed - needs Endo and Nephro appointment All questions answered

## 2018-04-26 NOTE — Patient Instructions (Signed)
Medication Instructions:  Your physician recommends that you continue on your current medications as directed. Please refer to the Current Medication list given to you today. If you need a refill on your cardiac medications before your next appointment, please call your pharmacy.   Lab work: None  If you have labs (blood work) drawn today and your tests are completely normal, you will receive your results only by: Marland Kitchen MyChart Message (if you have MyChart) OR . A paper copy in the mail If you have any lab test that is abnormal or we need to change your treatment, we will call you to review the results.  Testing/Procedures: None   Follow-Up: At [facility], you and your health needs are our priority.  As part of our continuing mission to provide you with exceptional heart care, we have created designated Provider Care Teams.  These Care Teams include your primary Cardiologist (physician) and Advanced Practice Providers (APPs -  Physician Assistants and Nurse Practitioners) who all work together to provide you with the care you need, when you need it. . Your physician recommends that you schedule a follow-up appointment in: 2 months with Dr Oval Linsey  Any Other Special Instructions Will Be Listed Below (If Applicable). Check your blood pressure and heart rate once a day and record on log

## 2018-04-26 NOTE — Progress Notes (Signed)
Cardiology Office Note    Date:  04/28/2018   ID:  [name], DOB 24-Jun-1970, MRN [id number]  PCP:  Lanae Boast, [place]  Cardiologist:  Dr. Oval Linsey   Chief Complaint  Patient presents with  . Hospitalization Follow-up    seen for Dr. Oval Linsey.    History of Present Illness:  Christina Orozco is a 47 y.o. female with PMH of HTN, severe LVH, DM, recurrent CVA, and asthma.  She had a echocardiogram in November 2017 that showed EF 65 to 70%, mild basal septal hypertrophy.  Repeat echo obtained on 12/01/2016 showed EF 65 to 70%, severe LVH.  Patient was evaluated by cardiology for management of hypertension and LVH.  She was admitted in February 2019 with acute stroke after presenting with dizziness.  Carotid Doppler showed mild disease bilaterally.  Repeat echocardiogram was unchanged.  TEE obtained in March 2019 revealed no PFO.  Loop recorder was placed at that time.  Renal artery Doppler obtained on 09/25/2017 showed no evidence of renal artery stenosis. Patient was last seen by Dr. Oval Linsey in August 2019, due to acute kidney injury, her HCTZ was stopped.  Amlodipine, hydralazine were increased.  Lasix was also increased to 80 mg twice daily.  Patient presented to [facility] on 03/25/2018 after being found down at [facility].  On EMS arrival, patient was confused and postictal in the store.  On arrival to the ED, she was back in her normal self.  While she was worked up for seizure, and was brought over for CT scan she had another recurrent episode.  She was intubated for airway protection.  The seizure episodes were thought to be secondary to uncontrolled hypertension-PRES (posterior reversible encephalopathy syndrome) versus hypoglycemia.  She was ruled out of back to meningitis.  CT angiogram of the head and neck showed no dissection, aneurysm or stenosis.  Urine drug screen was negative.  MRI showed indistinct T2 hyperintensity in the pons that progressed from 07/25/2017.   Patient with fluctuating pontine signal compared to previous study favoring atypical PRES. EEG showed a mixture of theta and alpha rhythm seen from central in the temporal region but did not record any clinical or subclinical seizure.  Patient was maintained on Keppra for seizure prophylaxis.  Plavix for CVA prophylaxis.  She was eventually extubated on 03/29/2018 and discharged 4 days later to inpatient rehab.  She was eventually discharged from inpatient rehab on 04/12/2018.  Patient presents today for cardiology office visit.  She is irritated that to this day she does not know the exact diagnosis that caused her issue.  She still have a lot of left leg weakness, however has equal strength in the upper arm.  She is getting physical therapy at home.  Her blood pressure today is borderline elevated.  She is hesitant to add a fourth blood pressure medication to control her blood pressure.  She is on the maximum dose of amlodipine, carvedilol and hydralazine.  She says she still have episodic shortness of breath and dizziness.  Her dizziness is not related to positional changes.  Everything is spontaneous.  She denies any chest pain.  I recommend continue on the current blood pressure medication.  Record her blood pressure on a daily basis and reassess in 2 months.  If her blood pressure at home has been high and her blood pressure cuff is accurate, we can increase her medication by adding a fourth blood pressure medication.  Previous renal artery Doppler again in April 2019 was negative  for renal artery stenosis.  Although adrenal issues may cause her symptom, I will defer this decision to Dr. Oval Linsey regarding whether or not to pursue any adrenal work-up.  She is under a lot of stress from financial issues.   Past Medical History:  Diagnosis Date  . Anemia    severe  . Asthma   . CKD (chronic kidney disease)    stage IV  . DDD (degenerative disc disease), cervical   . Diabetes mellitus ([place])   .  Dislocated shoulder    right  . GERD (gastroesophageal reflux disease)   . Hypertension   . Neuropathy   . Stroke [facility])     Past Surgical History:  Procedure Laterality Date  . ADENOIDECTOMY    . APPENDECTOMY    . CERVICAL SPINE SURGERY  06/2012   C5-C6  . EYE SURGERY     left eye surgery   . FOOT SURGERY    . LOOP RECORDER INSERTION N/A 08/27/2017   Procedure: LOOP RECORDER INSERTION;  Surgeon: Sanda Klein, MD;  Location: [place] [facility];  Service: Cardiovascular;  Laterality: N/A;  . SHOULDER SURGERY    . TEE WITHOUT CARDIOVERSION N/A 08/27/2017   Procedure: TRANSESOPHAGEAL ECHOCARDIOGRAM (TEE);  Surgeon: Sanda Klein, MD;  Location: [facility] ENDOSCOPY;  Service: Cardiovascular;  Laterality: N/A;  . TONSILLECTOMY      Current Medications: Outpatient Medications Prior to Visit  Medication Sig Dispense Refill  . albuterol (PROAIR HFA) 108 (90 Base) MCG/ACT inhaler INHALE 1 OR 2 PUFFS INTO THE LUNGS EVERY 6 HOURS AS NEEDED FOR WHEEZING OR SHORTNESS OF BREATH 8.5 g 0  . amLODipine (NORVASC) 10 MG tablet Take 1 tablet (10 mg total) by mouth daily. 90 tablet 3  . atorvastatin (LIPITOR) 40 MG tablet TAKE 1 TABLET BY MOUTH EVERY EVENING AT 6PM 30 tablet 0  . carvedilol (COREG) 25 MG tablet Take 1 tablet (25 mg total) by mouth 2 (two) times daily with a meal. Take 25 mg twice daily 180 tablet 3  . clopidogrel (PLAVIX) 75 MG tablet Take 1 tablet (75 mg total) by mouth daily. 90 tablet 1  . doxazosin (CARDURA) 4 MG tablet Take 1 tablet (4 mg total) by mouth daily. 90 tablet 1  . ferrous sulfate 325 (65 FE) MG tablet Take 1 tablet (325 mg total) by mouth daily. 30 tablet 3  . furosemide (LASIX) 40 MG tablet Take 1 tablet (40 mg total) by mouth daily. 30 tablet 1  . glucose blood (CONTOUR NEXT TEST) test strip 1 each by Other route 2 (two) times daily. And lancets 2/day 100 each 12  . hydrALAZINE (APRESOLINE) 100 MG tablet Take 1 tablet (100 mg total) by mouth every 6 (six) hours. 180  tablet 1  . insulin glargine (LANTUS) 100 unit/mL SOPN Inject 0.2 mLs (20 Units total) into the skin at bedtime. 15 mL 11  . levETIRAcetam (KEPPRA) 500 MG tablet Take 1 tablet (500 mg total) by mouth 2 (two) times daily. 60 tablet 0  . oxyCODONE (OXY IR/ROXICODONE) 5 MG immediate release tablet Take 1 tablet (5 mg total) by mouth every 4 (four) hours as needed for moderate pain. (Patient not taking: Reported on 04/26/2018) 20 tablet 0  . pantoprazole (PROTONIX) 40 MG tablet Take 1 tablet (40 mg total) by mouth daily. 30 tablet 0  . dextromethorphan-guaiFENesin (MUCINEX DM) 30-600 MG 12hr tablet Take 1 tablet by mouth 2 (two) times daily. 60 tablet 0  . guaiFENesin (ROBITUSSIN) 100 MG/5ML SOLN Take 5 mLs (  100 mg total) by mouth every 4 (four) hours as needed for cough or to loosen phlegm. 1200 mL 0  . loratadine (CLARITIN) 10 MG tablet Take 1 tablet (10 mg total) by mouth daily. 30 tablet 0  . acetaminophen (TYLENOL) 325 MG tablet Take 2 tablets (650 mg total) by mouth every 6 (six) hours as needed for mild pain, fever or headache.     No facility-administered medications prior to visit.      Allergies:   Lisinopril; Penicillins; and Gabapentin   Social History   Socioeconomic History  . Marital status: Single    Spouse name: Not on file  . Number of children: 3  . Years of education: 12+  . Highest education level: Not on file  Occupational History  . Not on file  Social Needs  . Financial resource strain: Not on file  . Food insecurity:    Worry: Not on file    Inability: Not on file  . Transportation needs:    Medical: Not on file    Non-medical: Not on file  Tobacco Use  . Smoking status: Never Smoker  . Smokeless tobacco: Never Used  Substance and Sexual Activity  . Alcohol use: No  . Drug use: No  . Sexual activity: Not on file  Lifestyle  . Physical activity:    Days per week: Not on file    Minutes per session: Not on file  . Stress: Not on file  Relationships  .  Social connections:    Talks on phone: Not on file    Gets together: Not on file    Attends religious service: Not on file    Active member of club or organization: Not on file    Attends meetings of clubs or organizations: Not on file    Relationship status: Not on file  Other Topics Concern  . Not on file  Social History Narrative   Lives with daughter   Caffeine use: 3 cups daily   Right handed     Family History:  The patient's family history includes Breast cancer (age of onset: 87) in her sister; CAD in her mother; Cancer in her unknown relative; Diabetes in her unknown relative; Heart disease in her unknown relative; Heart failure in her mother; Seizures in her unknown relative; Vascular Disease in her mother.   ROS:   Please see the history of present illness.    ROS All other systems reviewed and are negative.   PHYSICAL EXAM:   VS:  BP 136/85   Pulse 78   Resp 16   Ht 5' 2.5" (1.588 m)   Wt 194 lb (88 kg)   SpO2 97%   BMI 34.92 kg/m    GEN: Well nourished, well developed, in no acute distress  HEENT: normal  Neck: no JVD, carotid bruits, or masses Cardiac: RRR; no murmurs, rubs, or gallops,no edema  Respiratory:  clear to auscultation bilaterally, normal work of breathing GI: soft, nontender, nondistended, + BS MS: no deformity or atrophy  Skin: warm and dry, no rash Neuro:  Alert and Oriented x 3, Strength and sensation are intact Psych: euthymic mood, full affect  Wt Readings from Last 3 Encounters:  04/26/18 194 lb (88 kg)  04/26/18 194 lb (88 kg)  04/23/18 197 lb (89.4 kg)      Studies/Labs Reviewed:   EKG:  EKG is not ordered today.    Recent Labs: 07/27/2017: TSH 1.883 03/28/2018: Magnesium 2.6 04/03/2018: ALT 30 04/12/2018: BUN 38;  Creatinine, Ser 2.27; Hemoglobin 8.5; Platelets 326; Potassium 3.9; Sodium 137   Lipid Panel    Component Value Date/Time   CHOL 99 07/26/2017 1118   TRIG 138 03/27/2018 0414   HDL 42 07/26/2017 1118   CHOLHDL  2.4 07/26/2017 1118   VLDL 14 07/26/2017 1118   LDLCALC 43 07/26/2017 1118    Additional studies/ records that were reviewed today include:   Echo 08/27/2017 LV EF: 55% -   60% Study Conclusions  - Left ventricle: Systolic function was normal. The estimated   ejection fraction was in the range of 55% to 60%. Wall motion was   normal; there were no regional wall motion abnormalities. Left   ventricular diastolic function parameters were normal. - Left atrium: No evidence of thrombus in the atrial cavity or   appendage. No evidence of thrombus in the atrial cavity or   appendage. No spontaneous echo contrast was observed. - Right atrium: No evidence of thrombus in the atrial cavity or   appendage. - Atrial septum: No defect or patent foramen ovale was identified.   Echo contrast study showed no right-to-left atrial level shunt,   at baseline or with provocation.    ASSESSMENT:    1. Essential hypertension   2. Seizure ([place])   3. H/O: CVA (cerebrovascular accident)   4. Controlled type 2 diabetes mellitus without complication, without long-term current use of insulin (HCC)      PLAN:  In order of problems listed above:  1. Hypertension: Blood pressure borderline elevated.  Previous renal artery ultrasound did not show any significant stenosis.  I am hesitant to add on the fourth blood pressure medication medications this time.  I instructed the patient to continue to monitor blood pressure for the next 61-month.  If her blood pressure continue to be elevated on follow-up, will need to consider addition of another medication  2. Seizure: She has had at least 2 episodes of seizure during the recent hospitalization.  She is currently on Keppra.  Continue to have left lower extremity weakness after the recent episode.  EEG obtained in the hospital showed possible PRES  3. History of CVA: Recent MRI of the brain showed nonspecific finding.  She has left lower extremity weakness after  the recent seizure episode  4. DM2: On insulin at home.  Managed by primary care provider.    Medication Adjustments/Labs and Tests Ordered: Current medicines are reviewed at length with the patient today.  Concerns regarding medicines are outlined above.  Medication changes, Labs and Tests ordered today are listed in the Patient Instructions below. Patient Instructions  Medication Instructions:  Your physician recommends that you continue on your current medications as directed. Please refer to the Current Medication list given to you today. If you need a refill on your cardiac medications before your next appointment, please call your pharmacy.   Lab work: None  If you have labs (blood work) drawn today and your tests are completely normal, you will receive your results only by: Marland Kitchen MyChart Message (if you have MyChart) OR . A paper copy in the mail If you have any lab test that is abnormal or we need to change your treatment, we will call you to review the results.  Testing/Procedures: None   Follow-Up: At [facility], you and your health needs are our priority.  As part of our continuing mission to provide you with exceptional heart care, we have created designated Provider Care Teams.  These Care Teams include your  primary Cardiologist (physician) and Advanced Practice Providers (APPs -  Physician Assistants and Nurse Practitioners) who all work together to provide you with the care you need, when you need it. . Your physician recommends that you schedule a follow-up appointment in: 2 months with Dr Oval Linsey  Any Other Special Instructions Will Be Listed Below (If Applicable). Check your blood pressure and heart rate once a day and record on log      Hilbert Corrigan, [place]  04/28/2018 10:49 PM    [address], [place], [place]  [address] Phone: [phone]; Fax: [phone]

## 2018-04-28 ENCOUNTER — Encounter: Payer: Self-pay | Admitting: Physician Assistant

## 2018-05-01 ENCOUNTER — Telehealth: Payer: Self-pay

## 2018-05-01 NOTE — Telephone Encounter (Signed)
Curt Bears, OT from [facility] called requesting HHOT 2wk2, 1wk1. Orders approved and given per discharge summary.

## 2018-05-03 LAB — HM DIABETES EYE EXAM

## 2018-05-07 ENCOUNTER — Telehealth: Payer: Self-pay | Admitting: *Deleted

## 2018-05-07 NOTE — Telephone Encounter (Signed)
[facility] PT called for extension on PT 1 wk4.  Approval given.

## 2018-05-10 ENCOUNTER — Encounter (HOSPITAL_COMMUNITY): Payer: BLUE CROSS/BLUE SHIELD

## 2018-05-14 ENCOUNTER — Other Ambulatory Visit: Payer: Self-pay

## 2018-05-14 ENCOUNTER — Telehealth: Payer: Self-pay

## 2018-05-14 MED ORDER — LEVETIRACETAM 500 MG PO TABS: 500.0000 mg | ORAL_TABLET | Freq: Two times a day (BID) | ORAL | 0 refills | Status: DC

## 2018-05-15 ENCOUNTER — Ambulatory Visit (INDEPENDENT_AMBULATORY_CARE_PROVIDER_SITE_OTHER): Payer: BLUE CROSS/BLUE SHIELD

## 2018-05-15 DIAGNOSIS — I639 Cerebral infarction, unspecified: Secondary | ICD-10-CM | POA: Diagnosis not present

## 2018-05-16 NOTE — Progress Notes (Signed)
Carelink Summary Report / Loop Recorder 

## 2018-05-23 ENCOUNTER — Encounter: Payer: Self-pay | Admitting: Family Medicine

## 2018-05-23 ENCOUNTER — Other Ambulatory Visit: Payer: Self-pay | Admitting: Family Medicine

## 2018-05-23 ENCOUNTER — Telehealth: Payer: Self-pay

## 2018-05-23 ENCOUNTER — Ambulatory Visit (INDEPENDENT_AMBULATORY_CARE_PROVIDER_SITE_OTHER): Payer: BLUE CROSS/BLUE SHIELD | Admitting: Family Medicine

## 2018-05-23 ENCOUNTER — Other Ambulatory Visit: Payer: Self-pay

## 2018-05-23 VITALS — BP 142/88 | HR 76 | Ht 63.0 in | Wt 190.2 lb

## 2018-05-23 DIAGNOSIS — E785 Hyperlipidemia, unspecified: Secondary | ICD-10-CM

## 2018-05-23 DIAGNOSIS — Z09 Encounter for follow-up examination after completed treatment for conditions other than malignant neoplasm: Secondary | ICD-10-CM | POA: Diagnosis not present

## 2018-05-23 DIAGNOSIS — L0291 Cutaneous abscess, unspecified: Secondary | ICD-10-CM | POA: Diagnosis not present

## 2018-05-23 LAB — POCT URINALYSIS DIP (MANUAL ENTRY)
Bilirubin, UA: NEGATIVE
Blood, UA: NEGATIVE
Glucose, UA: NEGATIVE mg/dL
Ketones, POC UA: NEGATIVE mg/dL
Nitrite, UA: NEGATIVE
Protein Ur, POC: 100 mg/dL — AB
Spec Grav, UA: 1.02 (ref 1.010–1.025)
Urobilinogen, UA: 0.2 E.U./dL
pH, UA: 5 (ref 5.0–8.0)

## 2018-05-23 MED ORDER — MECLIZINE HCL 25 MG PO TABS: 25.0000 mg | ORAL_TABLET | Freq: Three times a day (TID) | ORAL | 0 refills | Status: DC | PRN

## 2018-05-23 MED ORDER — SULFAMETHOXAZOLE-TRIMETHOPRIM 800-160 MG PO TABS: 1.0000 | ORAL_TABLET | Freq: Two times a day (BID) | ORAL | 0 refills | Status: AC

## 2018-05-23 NOTE — Telephone Encounter (Signed)
Patient needs to know when she can return to work. Please advise. She ment to ask you during appointment today. Thanks!

## 2018-05-23 NOTE — Patient Instructions (Signed)
Skin Abscess A skin abscess is an infected area on or under your skin that contains pus and other material. An abscess can happen almost anywhere on your body. Some abscesses break open (rupture) on their own. Most continue to get worse unless they are treated. The infection can spread deeper into the body and into your blood, which can make you feel sick. Treatment usually involves draining the abscess. Follow these instructions at home: Abscess Care  If you have an abscess that has not drained, place a warm, clean, wet washcloth over the abscess several times a day. Do this as told by your doctor.  Follow instructions from your doctor about how to take care of your abscess. Make sure you: ? Cover the abscess with a bandage (dressing). ? Change your bandage or gauze as told by your doctor. ? Wash your hands with soap and water before you change the bandage or gauze. If you cannot use soap and water, use hand sanitizer.  Check your abscess every day for signs that the infection is getting worse. Check for: ? More redness, swelling, or pain. ? More fluid or blood. ? Warmth. ? More pus or a bad smell. Medicines   Take over-the-counter and prescription medicines only as told by your doctor.  If you were prescribed an antibiotic medicine, take it as told by your doctor. Do not stop taking the antibiotic even if you start to feel better. General instructions  To avoid spreading the infection: ? Do not share personal care items, towels, or hot tubs with others. ? Avoid making skin-to-skin contact with other people.  Keep all follow-up visits as told by your doctor. This is important. Contact a doctor if:  You have more redness, swelling, or pain around your abscess.  You have more fluid or blood coming from your abscess.  Your abscess feels warm when you touch it.  You have more pus or a bad smell coming from your abscess.  You have a fever.  Your muscles ache.  You have  chills.  You feel sick. Get help right away if:  You have very bad (severe) pain.  You see red streaks on your skin spreading away from the abscess. This information is not intended to replace advice given to you by your health care provider. Make sure you discuss any questions you have with your health care provider. Document Released: 11/15/2007 Document Revised: 01/23/2016 Document Reviewed: 04/07/2015 Elsevier Interactive Patient Education  2018 [employer]  

## 2018-05-23 NOTE — Progress Notes (Signed)
Acute Office Visit  Subjective:    Patient ID: Christina Orozco, female    DOB: [birth year], 47 y.o.   MRN: [id number]  Chief Complaint  Patient presents with  . abscess in between butt cheeks  . Eye Drainage    HPI Patient is in today for abscess to the left buttocks.  Patient states that she was unaware of the length of time that the abscess has been present.  Has been using warm baths to help with pain.  Denies drainage of the area.  Patient states that her pain today is 10 out of 10 pain.  Past Medical History:  Diagnosis Date  . Anemia    severe  . Asthma   . CKD (chronic kidney disease)    stage IV  . DDD (degenerative disc disease), cervical   . Diabetes mellitus ([place])   . Dislocated shoulder    right  . GERD (gastroesophageal reflux disease)   . Hypertension   . Neuropathy   . Stroke [facility])     Past Surgical History:  Procedure Laterality Date  . ADENOIDECTOMY    . APPENDECTOMY    . CERVICAL SPINE SURGERY  06/2012   C5-C6  . EYE SURGERY     left eye surgery   . FOOT SURGERY    . LOOP RECORDER INSERTION N/A 08/27/2017   Procedure: LOOP RECORDER INSERTION;  Surgeon: Sanda Klein, MD;  Location: [facility];  Service: Cardiovascular;  Laterality: N/A;  . SHOULDER SURGERY    . TEE WITHOUT CARDIOVERSION N/A 08/27/2017   Procedure: TRANSESOPHAGEAL ECHOCARDIOGRAM (TEE);  Surgeon: Sanda Klein, MD;  Location: [facility];  Service: Cardiovascular;  Laterality: N/A;  . TONSILLECTOMY      Family History  Problem Relation Age of Onset  . Vascular Disease Mother   . CAD Mother   . Heart failure Mother   . Heart disease Other   . Cancer Other        colon, stomach, lung  . Diabetes Other   . Seizures Other   . Breast cancer Sister 61    Social History   Socioeconomic History  . Marital status: Single    Spouse name: Not on file  . Number of children: 3  . Years of education: 12+  . Highest education level: Not on file  Occupational History   . Not on file  Social Needs  . Financial resource strain: Not on file  . Food insecurity:    Worry: Not on file    Inability: Not on file  . Transportation needs:    Medical: Not on file    Non-medical: Not on file  Tobacco Use  . Smoking status: Never Smoker  . Smokeless tobacco: Never Used  Substance and Sexual Activity  . Alcohol use: No  . Drug use: No  . Sexual activity: Not on file  Lifestyle  . Physical activity:    Days per week: Not on file    Minutes per session: Not on file  . Stress: Not on file  Relationships  . Social connections:    Talks on phone: Not on file    Gets together: Not on file    Attends religious service: Not on file    Active member of club or organization: Not on file    Attends meetings of clubs or organizations: Not on file    Relationship status: Not on file  . Intimate partner violence:    Fear of current or ex partner: Not  on file    Emotionally abused: Not on file    Physically abused: Not on file    Forced sexual activity: Not on file  Other Topics Concern  . Not on file  Social History Narrative   Lives with daughter   Caffeine use: 3 cups daily   Right handed    Outpatient Medications Prior to Visit  Medication Sig Dispense Refill  . acetaminophen (TYLENOL) 325 MG tablet Take 2 tablets (650 mg total) by mouth every 6 (six) hours as needed for mild pain, fever or headache.    . albuterol (PROAIR HFA) 108 (90 Base) MCG/ACT inhaler INHALE 1 OR 2 PUFFS INTO THE LUNGS EVERY 6 HOURS AS NEEDED FOR WHEEZING OR SHORTNESS OF BREATH 8.5 g 0  . amLODipine (NORVASC) 10 MG tablet Take 1 tablet (10 mg total) by mouth daily. 90 tablet 3  . atorvastatin (LIPITOR) 40 MG tablet TAKE 1 TABLET BY MOUTH EVERY EVENING AT 6PM 30 tablet 0  . carvedilol (COREG) 25 MG tablet Take 1 tablet (25 mg total) by mouth 2 (two) times daily with a meal. Take 25 mg twice daily 180 tablet 3  . clopidogrel (PLAVIX) 75 MG tablet Take 1 tablet (75 mg total) by mouth  daily. 90 tablet 1  . doxazosin (CARDURA) 4 MG tablet Take 1 tablet (4 mg total) by mouth daily. 90 tablet 1  . ferrous sulfate 325 (65 FE) MG tablet Take 1 tablet (325 mg total) by mouth daily. 30 tablet 3  . furosemide (LASIX) 40 MG tablet Take 1 tablet (40 mg total) by mouth daily. 30 tablet 1  . glucose blood (CONTOUR NEXT TEST) test strip 1 each by Other route 2 (two) times daily. And lancets 2/day 100 each 12  . hydrALAZINE (APRESOLINE) 100 MG tablet Take 1 tablet (100 mg total) by mouth every 6 (six) hours. 180 tablet 1  . insulin glargine (LANTUS) 100 unit/mL SOPN Inject 0.2 mLs (20 Units total) into the skin at bedtime. 15 mL 11  . levETIRAcetam (KEPPRA) 500 MG tablet Take 1 tablet (500 mg total) by mouth 2 (two) times daily. 60 tablet 0  . pantoprazole (PROTONIX) 40 MG tablet Take 1 tablet (40 mg total) by mouth daily. 30 tablet 0  . oxyCODONE (OXY IR/ROXICODONE) 5 MG immediate release tablet Take 1 tablet (5 mg total) by mouth every 4 (four) hours as needed for moderate pain. (Patient not taking: Reported on 04/26/2018) 20 tablet 0   No facility-administered medications prior to visit.     Allergies  Allergen Reactions  . Lisinopril Swelling and Other (See Comments)    Facial swelling   . Penicillins Hives    Has patient had a PCN reaction causing immediate rash, facial/tongue/throat swelling, SOB or lightheadedness with hypotension: yes Has patient had a PCN reaction causing severe rash involving mucus membranes or skin necrosis: No Has patient had a PCN reaction that required hospitalization No Has patient had a PCN reaction occurring within the last 10 years: No If all of the above answers are "NO", then may proceed wit  . Gabapentin Swelling and Other (See Comments)    "I thought I was having another stroke."     Review of Systems  Skin: Positive for rash (abscess).       Objective:    Physical Exam  Constitutional: She is oriented to person, place, and time. She  appears well-developed and well-nourished. No distress.  HENT:  Head: Normocephalic and atraumatic.  Neurological: She is alert  and oriented to person, place, and time.  Skin: Lesion (non indurated non fluctuant abscess noted to the left buttocks. Near the anus. ) noted.    BP (!) 142/88 (BP Location: Left Arm, Patient Position: Sitting, Cuff Size: Small)   Pulse 76   Ht 5\' 3"  (1.6 m)   Wt 190 lb 3.2 oz (86.3 kg)   LMP  (LMP Unknown)   SpO2 98%   BMI 33.69 kg/m  Wt Readings from Last 3 Encounters:  05/23/18 190 lb 3.2 oz (86.3 kg)  04/26/18 194 lb (88 kg)  04/26/18 194 lb (88 kg)    Health Maintenance Due  Topic Date Due  . TETANUS/TDAP  05/22/1992    There are no preventive care reminders to display for this patient.   Lab Results  Component Value Date   TSH 1.883 07/27/2017   Lab Results  Component Value Date   WBC 6.5 04/12/2018   HGB 8.5 (L) 04/12/2018   HCT 26.8 (L) 04/12/2018   MCV 89.3 04/12/2018   PLT 326 04/12/2018   Lab Results  Component Value Date   NA 137 04/12/2018   K 3.9 04/12/2018   CO2 20 (L) 04/12/2018   GLUCOSE 148 (H) 04/12/2018   BUN 38 (H) 04/12/2018   CREATININE 2.27 (H) 04/12/2018   BILITOT 0.7 04/03/2018   ALKPHOS 58 04/03/2018   AST 30 04/03/2018   ALT 30 04/03/2018   PROT 5.8 (L) 04/03/2018   ALBUMIN 2.3 (L) 04/03/2018   CALCIUM 8.6 (L) 04/12/2018   ANIONGAP 5 04/12/2018   Lab Results  Component Value Date   CHOL 99 07/26/2017   Lab Results  Component Value Date   HDL 42 07/26/2017   Lab Results  Component Value Date   LDLCALC 43 07/26/2017   Lab Results  Component Value Date   TRIG 138 03/27/2018   Lab Results  Component Value Date   CHOLHDL 2.4 07/26/2017   Lab Results  Component Value Date   HGBA1C 10.3 (A) 01/11/2018       Assessment & Plan:   Problem List Items Addressed This Visit    None    Visit Diagnoses    Abscess    -  Primary   Relevant Medications   sulfamethoxazole-trimethoprim  (BACTRIM DS,SEPTRA DS) 800-160 MG tablet   Follow up       Relevant Orders   POCT urinalysis dipstick (Completed)       Meds ordered this encounter  Medications  . sulfamethoxazole-trimethoprim (BACTRIM DS,SEPTRA DS) 800-160 MG tablet    Sig: Take 1 tablet by mouth 2 (two) times daily for 10 days.    Dispense:  20 tablet    Refill:  0   Continue with warm baths and discussed starting sitz baths due to patient's limited mobility.  RTC PRN  Lanae Boast, FNP

## 2018-05-24 NOTE — Telephone Encounter (Signed)
I spoke to Almyra Free from [employer] regarding this patient. She was seen by Dr. Posey Pronto and will call and discuss further with him. Today's visit was an acute visit. Please have patient to follow up with Dr. Posey Pronto.

## 2018-05-28 ENCOUNTER — Encounter (HOSPITAL_COMMUNITY): Payer: BLUE CROSS/BLUE SHIELD

## 2018-05-30 ENCOUNTER — Encounter: Payer: Self-pay | Admitting: Neurology

## 2018-05-30 ENCOUNTER — Ambulatory Visit: Payer: BLUE CROSS/BLUE SHIELD | Admitting: Neurology

## 2018-05-30 VITALS — BP 134/84 | HR 68 | Ht 62.5 in | Wt 187.0 lb

## 2018-05-30 DIAGNOSIS — R419 Unspecified symptoms and signs involving cognitive functions and awareness: Secondary | ICD-10-CM | POA: Diagnosis not present

## 2018-05-30 DIAGNOSIS — Z9119 Patient's noncompliance with other medical treatment and regimen: Secondary | ICD-10-CM | POA: Diagnosis not present

## 2018-05-30 DIAGNOSIS — I6381 Other cerebral infarction due to occlusion or stenosis of small artery: Secondary | ICD-10-CM | POA: Diagnosis not present

## 2018-05-30 DIAGNOSIS — Z91199 Patient's noncompliance with other medical treatment and regimen due to unspecified reason: Secondary | ICD-10-CM
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
## 2018-05-30 NOTE — Patient Instructions (Signed)
No driving for 3-97 months Vestibular therapy  Seizure, Adult A seizure is a sudden burst of abnormal electrical activity in the brain. The abnormal activity temporarily interrupts normal brain function, causing a person to experience any of the following:  Involuntary movements.  Changes in awareness or consciousness.  Uncontrollable shaking (convulsions). Seizures usually last from 30 seconds to 2 minutes. They usually do not cause permanent brain damage unless they are prolonged. What are the causes? This condition may be caused by:  Fever.  Low blood sugar.  Medicine.  Illness.  Brain injury.  Brain tumor.  Stroke.  A condition that is passed from parent to child (genetic).  Addiction to a substance (substanceuse disorder) or suddenly stopping the use of a substance (withdrawal). Some people who have a seizure never have another one. People who have repeated seizures have a condition called epilepsy. What are the signs or symptoms? Symptoms of this condition vary greatly from person to person. They include:  Convulsions.  Stiffening of the body.  Involuntary movements of the arms or legs.  Loss of consciousness.  Breathing problems.  Falling suddenly.  Confusion.  Head nodding.  Eye blinking or fluttering.  Lip smacking or tongue biting.  Drooling.  Rapid eye movements.  Grunting.  Loss of bladder control and bowel control.  Staring.  Unresponsiveness. Some people have symptoms right before a seizure happens (aura) and right after a seizure happens.  Symptoms that may occur before a seizure include: ? Fear or anxiety. ? Nausea. ? Feeling like the room is spinning (vertigo). ? A feeling of having seen or heard something before (dj vu). ? Odd tastes or smells. ? Changes in vision, such as seeing flashing lights or spots.  Symptoms that may occur after a seizure include: ? Confusion. ? Sleepiness. ? Headache. ? Weakness on one side  of the body. How is this diagnosed? This condition may be diagnosed based on medical history and physical exam. You may also have other tests, including:  Blood tests.  Electroencephalogram, EEG.  CT scan.  MRI.  Spinal tap (lumbar puncture). How is this treated? Most seizures will stop on their own in under 5 minutes and no treatment is needed. Seizures lasting longer than 5 minutes will usually need treatment. Treatment includes:  Medicines given through IV.  Avoiding known triggers, such as medicines that you take for another condition.  Medicines to treat epilepsy (antiepileptics), if epilepsy caused your seizures.  Surgery to stop seizures, if you have epilepsy that does not respond to medicines. Follow these instructions at home: Medicines  Take over-the-counter and prescription medicines only as told by your health care provider.  Avoid any substances that may prevent your medicine from working properly, such as alcohol. Activity  Do not drive, swim, or do any other activities that would be dangerous if you had another seizure. Wait until your health care provider says it is safe to do them.  If you live in the [place] check with your local DMV (department of motor vehicles) to find out about local driving laws. Each state has specific rules about when you can legally return to driving.  Get enough rest. Lack of sleep can make seizures more likely to occur. Educating others Teach friends and family what to do if you have a seizure. They should:  Lay you on the ground to prevent a fall.  Cushion your head and body.  Loosen any tight clothing around your neck.  Turn you on your side. If vomiting  occurs, this helps keep your airway clear.  Not hold you down. Holding you down will not stop the seizure.  Not put anything into your mouth.  Know whether or not you need emergency care.  Stay with you until you recover.  General instructions  Contact your health  care provider each time you have a seizure.  Avoid anything that has ever triggered a seizure for you.  Keep a seizure diary. Record what you remember about each seizure, especially anything that might have triggered the seizure.  Keep all follow-up visits as told by your health care provider. This is important. Contact a health care provider if:  You have another seizure.  You have seizures more often.  Your seizure symptoms change.  You continue to have seizures with treatment.  You have symptoms of an infection or illness. These might increase your risk of having a seizure. Get help right away if:  You have a seizure that: ? Lasts longer than 5 minutes. ? Is different than previous seizures. ? Leaves you unable to speak or use a part of your body. ? Makes it harder to breathe.  You have: ? A seizure after a head injury. ? Multiple seizures in a row. ? Confusion or a severe headache right after a seizure.  You are having seizures more often.  You do not wake up immediately after a seizure.  You injure yourself during a seizure. These symptoms may represent a serious problem that is an emergency. Do not wait to see if the symptoms will go away. Get medical help right away. Call your local emergency services (911 in the [place] Do not drive yourself to the hospital. Summary  Seizures are caused by abnormal electrical activity in the brain. The activity disrupts normal brain function, leading to a change in consciousness, abnormal movements, or convulsions.  There are many causes of seizures including illnesses, medicines, genetic conditions, head injuries, strokes, tumors, substance abuse, or substance withdrawal.  Most seizures will stop on their own in under 5 minutes. Seizures lasting longer than 5 minutes are a medical emergency and require immediate treatment.  There are many medicines that are used to treat seizures. Take over-the-counter and prescription medicines  only as told by your health care provider. This information is not intended to replace advice given to you by your health care provider. Make sure you discuss any questions you have with your health care provider. Document Released: 05/26/2000 Document Revised: 07/05/2017 Document Reviewed: 07/05/2017 Elsevier Interactive Patient Education  2019 [employer]  Levetiracetam tablets What is this medicine? LEVETIRACETAM (lee ve tye RA se tam) is an antiepileptic drug. It is used with other medicines to treat certain types of seizures. This medicine may be used for other purposes; ask your health care provider or pharmacist if you have questions. COMMON BRAND NAME(S): Keppra, Roweepra What should I tell my health care provider before I take this medicine? They need to know if you have any of these conditions: -kidney disease -suicidal thoughts, plans, or attempt; a previous suicide attempt by you or a family member -an unusual or allergic reaction to levetiracetam, other medicines, foods, dyes, or preservatives -pregnant or trying to get pregnant -breast-feeding How should I use this medicine? Take this medicine by mouth with a glass of water. Follow the directions on the prescription label. Swallow the tablets whole. Do not crush or chew this medicine. You may take this medicine with or without food. Take your doses at regular intervals. Do not  take your medicine more often than directed. Do not stop taking this medicine or any of your seizure medicines unless instructed by your doctor or health care professional. Stopping your medicine suddenly can increase your seizures or their severity. A special MedGuide will be given to you by the pharmacist with each prescription and refill. Be sure to read this information carefully each time. Contact your pediatrician or health care professional regarding the use of this medication in children. While this drug may be prescribed for children as young as 55  years of age for selected conditions, precautions do apply. Overdosage: If you think you have taken too much of this medicine contact a poison control center or emergency room at once. NOTE: This medicine is only for you. Do not share this medicine with others. What if I miss a dose? If you miss a dose, take it as soon as you can. If it is almost time for your next dose, take only that dose. Do not take double or extra doses. What may interact with this medicine? This medicine may interact with the following medications: -carbamazepine -colesevelam -probenecid -sevelamer This list may not describe all possible interactions. Give your health care provider a list of all the medicines, herbs, non-prescription drugs, or dietary supplements you use. Also tell them if you smoke, drink alcohol, or use illegal drugs. Some items may interact with your medicine. What should I watch for while using this medicine? Visit your doctor or health care professional for a regular check on your progress. Wear a medical identification bracelet or chain to say you have epilepsy, and carry a card that lists all your medications. It is important to take this medicine exactly as instructed by your health care professional. When first starting treatment, your dose may need to be adjusted. It may take weeks or months before your dose is stable. You should contact your doctor or health care professional if your seizures get worse or if you have any new types of seizures. You may get drowsy or dizzy. Do not drive, use machinery, or do anything that needs mental alertness until you know how this medicine affects you. Do not stand or sit up quickly, especially if you are an older patient. This reduces the risk of dizzy or fainting spells. Alcohol may interfere with the effect of this medicine. Avoid alcoholic drinks. The use of this medicine may increase the chance of suicidal thoughts or actions. Pay special attention to how you  are responding while on this medicine. Any worsening of mood, or thoughts of suicide or dying should be reported to your health care professional right away. Women who become pregnant while using this medicine may enroll in the [place] Pregnancy Registry by calling [phone]. This registry collects information about the safety of antiepileptic drug use during pregnancy. What side effects may I notice from receiving this medicine? Side effects that you should report to your doctor or health care professional as soon as possible: -allergic reactions like skin rash, itching or hives, swelling of the face, lips, or tongue -breathing problems -dark urine -general ill feeling or flu-like symptoms -problems with balance, talking, walking -unusually weak or tired -worsening of mood, thoughts or actions of suicide or dying -yellowing of the eyes or skin Side effects that usually do not require medical attention (report to your doctor or health care professional if they continue or are bothersome): -diarrhea -dizzy, drowsy -headache -loss of appetite This list may not describe all possible side  effects. Call your doctor for medical advice about side effects. You may report side effects to FDA at 1-800-FDA-1088. Where should I keep my medicine? Keep out of reach of children. Store at room temperature between 15 and 30 degrees C (59 and 86 degrees F). Throw away any unused medicine after the expiration date. NOTE: This sheet is a summary. It may not cover all possible information. If you have questions about this medicine, talk to your doctor, pharmacist, or health care provider.  2019 Elsevier/Gold Standard (2015-07-01 09:43:54)

## 2018-06-09 ENCOUNTER — Other Ambulatory Visit: Payer: Self-pay | Admitting: Family Medicine

## 2018-06-10 ENCOUNTER — Other Ambulatory Visit: Payer: Self-pay | Admitting: Family Medicine

## 2018-06-13 ENCOUNTER — Other Ambulatory Visit: Payer: Self-pay

## 2018-06-13 MED ORDER — FUROSEMIDE 40 MG PO TABS: 40.0000 mg | ORAL_TABLET | Freq: Every day | ORAL | 1 refills | Status: DC

## 2018-06-17 ENCOUNTER — Ambulatory Visit: Payer: BLUE CROSS/BLUE SHIELD | Admitting: Neurology

## 2018-06-17 ENCOUNTER — Ambulatory Visit (INDEPENDENT_AMBULATORY_CARE_PROVIDER_SITE_OTHER): Payer: BLUE CROSS/BLUE SHIELD

## 2018-06-17 DIAGNOSIS — I639 Cerebral infarction, unspecified: Secondary | ICD-10-CM | POA: Diagnosis not present

## 2018-06-17 LAB — CUP PACEART REMOTE DEVICE CHECK
MDC IDC PG IMPLANT DT: 20190318
MDC IDC SESS DTM: 20200106060957

## 2018-06-18 NOTE — Progress Notes (Signed)
Carelink Summary Report / Loop Recorder 

## 2018-06-23 ENCOUNTER — Other Ambulatory Visit: Payer: Self-pay | Admitting: Family Medicine

## 2018-06-23 DIAGNOSIS — E785 Hyperlipidemia, unspecified: Secondary | ICD-10-CM

## 2018-06-26 ENCOUNTER — Encounter: Payer: Self-pay | Admitting: Physical Medicine & Rehabilitation

## 2018-06-26 ENCOUNTER — Encounter
Payer: BLUE CROSS/BLUE SHIELD | Attending: Physical Medicine & Rehabilitation | Admitting: Physical Medicine & Rehabilitation

## 2018-06-26 VITALS — BP 156/90 | HR 69 | Resp 14 | Ht 62.5 in | Wt 185.0 lb

## 2018-06-26 DIAGNOSIS — R269 Unspecified abnormalities of gait and mobility: Secondary | ICD-10-CM

## 2018-06-26 DIAGNOSIS — G8929 Other chronic pain: Secondary | ICD-10-CM

## 2018-06-26 DIAGNOSIS — R519 Headache, unspecified: Secondary | ICD-10-CM

## 2018-06-26 DIAGNOSIS — N183 Chronic kidney disease, stage 3 unspecified: Secondary | ICD-10-CM

## 2018-06-26 DIAGNOSIS — I10 Essential (primary) hypertension: Secondary | ICD-10-CM | POA: Diagnosis not present

## 2018-06-26 DIAGNOSIS — R5381 Other malaise: Secondary | ICD-10-CM | POA: Diagnosis not present

## 2018-06-26 DIAGNOSIS — R262 Difficulty in walking, not elsewhere classified: Secondary | ICD-10-CM | POA: Insufficient documentation

## 2018-06-26 DIAGNOSIS — E1122 Type 2 diabetes mellitus with diabetic chronic kidney disease: Secondary | ICD-10-CM | POA: Diagnosis not present

## 2018-06-26 DIAGNOSIS — G40909 Epilepsy, unspecified, not intractable, without status epilepticus: Secondary | ICD-10-CM | POA: Diagnosis not present

## 2018-06-26 DIAGNOSIS — R569 Unspecified convulsions: Secondary | ICD-10-CM

## 2018-06-26 DIAGNOSIS — Z8249 Family history of ischemic heart disease and other diseases of the circulatory system: Secondary | ICD-10-CM | POA: Diagnosis not present

## 2018-06-26 DIAGNOSIS — R51 Headache: Secondary | ICD-10-CM | POA: Insufficient documentation

## 2018-06-26 DIAGNOSIS — I129 Hypertensive chronic kidney disease with stage 1 through stage 4 chronic kidney disease, or unspecified chronic kidney disease: Secondary | ICD-10-CM | POA: Insufficient documentation

## 2018-06-26 DIAGNOSIS — E1142 Type 2 diabetes mellitus with diabetic polyneuropathy: Secondary | ICD-10-CM | POA: Diagnosis not present

## 2018-06-26 DIAGNOSIS — E1165 Type 2 diabetes mellitus with hyperglycemia: Secondary | ICD-10-CM

## 2018-06-26 DIAGNOSIS — R531 Weakness: Secondary | ICD-10-CM | POA: Insufficient documentation

## 2018-06-26 NOTE — Progress Notes (Signed)
Subjective:    Patient ID: Christina Orozco, female    DOB: 1971/02/15, 48 y.o.   MRN: [id number]  HPI Right-handed female, multi-medical, CKD stage III, diabetes mellitus and hypertension presents for follow up for debility.   Last clinic visit on 04/26/18.  Since that time, she states Neurology ordered ?vestibular rehab. She has not followed up with Endo due to financial reasons. She is taking antiepileptics. BP is elevated, she is following up with Cards. CBGs have been relatively controlled. She has not followed up with Nephro. Headaches have improved.  Fall yesterday, not sure what happened, not using assistive device.   Pain Inventory Average Pain 0 Pain Right Now 2 My pain is constant  In the last 24 hours, has pain interfered with the following? General activity 1 Relation with others 3 Enjoyment of life 3 What TIME of day is your pain at its worst? 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  no  Physicians involved in your care Any changes since last visit?  no   Family History  Problem Relation Age of Onset  . Vascular Disease Mother   . CAD Mother   . Heart failure Mother   . Heart disease Other   . Cancer Other        colon, stomach, pancreatic, lung  . Diabetes Other   . Breast cancer Sister 25  . Seizures Maternal Uncle    Social History   Socioeconomic History  . Marital status: Single    Spouse name: Not on file  . Number of children: 3  . Years of education: 12+  . Highest education level: Some college, no degree  Occupational History  . Not on file  Social Needs  . Financial resource strain: Not on file  . Food insecurity:    Worry: Not on file    Inability: Not on file    . Transportation needs:    Medical: Not on file    Non-medical: Not on file  Tobacco Use  . Smoking status: Never Smoker  . Smokeless tobacco: Never Used  Substance and Sexual Activity  . Alcohol use: No  . Drug use: No  . Sexual activity: Not on file  Lifestyle  . Physical activity:    Days per week: Not on file    Minutes per session: Not on file  . Stress: Not on file  Relationships  . Social connections:    Talks on phone: Not on file    Gets together: Not on file    Attends religious service: Not on file    Active member of club or organization: Not on file    Attends meetings of clubs or organizations: Not on file    Relationship status: Not on file  Other Topics Concern  . Not on file  Social History Narrative   Lives with daughter   Caffeine use: "once in a blue moon"   Right handed   Past Surgical History:  Procedure Laterality Date  . ADENOIDECTOMY    . APPENDECTOMY    . CERVICAL SPINE SURGERY  06/2012   C5-C6  . EYE SURGERY     left eye surgery   . FOOT SURGERY    . LOOP RECORDER INSERTION N/A 08/27/2017  Procedure: LOOP RECORDER INSERTION;  Surgeon: Sanda Klein, MD;  Location: [facility];  Service: Cardiovascular;  Laterality: N/A;  . SHOULDER SURGERY    . TEE WITHOUT CARDIOVERSION N/A 08/27/2017   Procedure: TRANSESOPHAGEAL ECHOCARDIOGRAM (TEE);  Surgeon: Sanda Klein, MD;  Location[facility];  Service: Cardiovascular;  Laterality: N/A;  . TONSILLECTOMY     Past Medical History:  Diagnosis Date  . Anemia    severe  . Asthma   . CKD (chronic kidney disease)    stage IV  . DDD (degenerative disc disease), cervical   . Diabetes mellitus ([place])   . Dislocated shoulder    right  . GERD (gastroesophageal reflux disease)   . Hypertension   . Neuropathy   . Stroke ([place])    BP (!) 156/90   Pulse 69   Resp 14   Ht 5' 2.5" (1.588 m)   Wt 185 lb (83.9 kg)   SpO2 96%   BMI 33.30 kg/m   Opioid Risk Score:   Fall Risk Score:   `1  Depression screen PHQ 2/9  Depression screen [facility] 2/9 05/23/2018 04/26/2018 04/23/2018 04/23/2018 01/10/2018 08/31/2017 08/03/2017  Decreased Interest 2 1 0 0 0 0 0  Down, Depressed, Hopeless 2 2 1 1  0 0 0  PHQ - 2 Score 4 3 1 1  0 0 0  Altered sleeping 2 2 - - - - -  Tired, decreased energy 2 2 - - - - -  Change in appetite 1 1 - - - - -  Feeling bad or failure about yourself  2 3 - - - - -  Trouble concentrating 0 0 - - - - -  Moving slowly or fidgety/restless 0 0 - - - - -  Suicidal thoughts 0 0 - - - - -  PHQ-9 Score 11 11 - - - - -  Difficult doing work/chores - Somewhat difficult - - - - -  Some recent data might be hidden     Review of Systems  Constitutional: Positive for appetite change and fever.  HENT: Negative.   Eyes: Negative.   Respiratory: Positive for cough.   Cardiovascular: Positive for leg swelling.  Gastrointestinal: Positive for abdominal pain and constipation.  Endocrine: Negative.   Genitourinary: Negative.   Musculoskeletal: Negative.   Skin: Negative.   Allergic/Immunologic: Negative.   Neurological: Positive for headaches.  Hematological: Negative.   Psychiatric/Behavioral: Positive for confusion.  All other systems reviewed and are negative.     Objective:   Physical Exam Constitutional: No distress . Vital signs reviewed. HENT: Normocephalic.  Atraumatic. Eyes: EOMI. No discharge. Cardiovascular: RRR. No JVD. Respiratory: CTA bilaterally. Normal effort. GI: BS +. Non-distended. Musc: No edema or tenderness in extremities. Musculoskeletal: She exhibits mild edema. Neurological: Patient is alert Follows simple commands. Motor: 5/5 throughout Psychiatric: Flat    Assessment & Plan:  48 year old right-handed female, multi-medical, CKD stage III, diabetes mellitus and hypertension presents for follow up for debility.   1.  Decreased functional ability secondary to seizures thought to be secondary to uncontrolled hypertension/PRES versus  hypoglycemia  Completed therapies, cont HEP  Neurology ordered ?vestibular therapies  Cont follow up with Neurology  Encouraged increased activity prior to return to work, will follow up in 4 weeks  2.  Seizure disorder.    Cont meds  Follow up with Neurology  3. Hypertension.    Cont meds  Elevated, being adjusted by Cards  4.  Diabetes mellitus with peripheral neuropathy.  Needs appointment with Endo, encouraged follow up  5. CKD  Needs appointment with Nephro, reminded again when finances allow  6. Headaches  Improved  7. Abnormality of gait  Cont HEP

## 2018-06-28 ENCOUNTER — Ambulatory Visit: Payer: BLUE CROSS/BLUE SHIELD | Admitting: Cardiovascular Disease

## 2018-06-28 NOTE — Progress Notes (Deleted)
Cardiology Office Note   Date:  06/28/2018   ID:  Christina Orozco, DOB 1970-08-03, MRN [id number]  PCP:  Lanae Boast, [place]  Cardiologist:   Skeet Latch, MD   No chief complaint on file.    History of Present Illness: Christina Orozco is a 48 y.o. female with hypertension, severe LVH, diabetes, stroke, and asthma who presents for follow-up. She was initially seen 12/2016 for an evaluation of hypertension and LVH.  She had an echo 04/2016 that revealed LVEF 65-70% and mild basal septum hypertrophy.  She had a repeat echo 12/01/16 that showed LVEF 65-70% and severe LVH.  She reports that she was initially diagnosed with hypertension in high school.  The only itme it was well-controlled was when she was pregnant. She has several family members with hypertension.  Her mother had a heart attack at age 25 and several other family members had premature CAD.  Carvedilol was increased, amlodipine and doxazosin were added.  Christina Orozco was admitted 07/2017 with an acute stroke of the left paramedian pons.  She presented with dizziness and her needed admission blood pressure was 148/85.  Carotid Dopplers revealed 1-39% ICA stenosis and echo was unchanged from 11/2016.  She was scheduled for TEE 08/2017 that revealed no PFO.  A loop recorder was placed at that time.  At her last appointment her blood pressure remained above goal so hydralazine was increased.  Since that time her blood pressure has been mostly in the 160s over 90s at home.  She has been under a lot of stress.  She is not getting along with her upstairs neighbor or her supervisor at work.  This is caused a lot of stress and she thinks is contributing to her elevated blood pressure.  At her last appointment Christina Orozco's BP was elevated.  She was on both HCTZ and furosemide.  HCTZ was discontinued and both amlodipine and hydralazine were increased.  She was admitted 03/2018 after being found down in the store.  She had a seizure and was post  ictal when EMS arrived.  She had a recurrent episode in the hospital.  Her seizure was thought to be due to uncontrolled hypertension/PRES vs. Hypoglycemia.  She was extubated and discharged to inpatient rehab.  She followed up with Almyra Deforest, PA, on 04/2018 and her BP was 136/85.  She declined adding additional BP medication.  Past Medical History:  Diagnosis Date  . Anemia    severe  . Asthma   . CKD (chronic kidney disease)    stage IV  . DDD (degenerative disc disease), cervical   . Diabetes mellitus ([place])   . Dislocated shoulder    right  . GERD (gastroesophageal reflux disease)   . Hypertension   . Neuropathy   . Stroke [facility])     Past Surgical History:  Procedure Laterality Date  . ADENOIDECTOMY    . APPENDECTOMY    . CERVICAL SPINE SURGERY  06/2012   C5-C6  . EYE SURGERY     left eye surgery   . FOOT SURGERY    . LOOP RECORDER INSERTION N/A 08/27/2017   Procedure: LOOP RECORDER INSERTION;  Surgeon: Sanda Klein, MD;  Location: [facility];  Service: Cardiovascular;  Laterality: N/A;  . SHOULDER SURGERY    . TEE WITHOUT CARDIOVERSION N/A 08/27/2017   Procedure: TRANSESOPHAGEAL ECHOCARDIOGRAM (TEE);  Surgeon: Sanda Klein, MD;  Location: [facility];  Service: Cardiovascular;  Laterality: N/A;  . TONSILLECTOMY  Current Outpatient Medications  Medication Sig Dispense Refill  . acetaminophen (TYLENOL) 325 MG tablet Take 2 tablets (650 mg total) by mouth every 6 (six) hours as needed for mild pain, fever or headache.    . albuterol (PROAIR HFA) 108 (90 Base) MCG/ACT inhaler INHALE 1 OR 2 PUFFS INTO THE LUNGS EVERY 6 HOURS AS NEEDED FOR WHEEZING OR SHORTNESS OF BREATH 8.5 g 0  . amLODipine (NORVASC) 10 MG tablet Take 1 tablet (10 mg total) by mouth daily. 90 tablet 3  . atorvastatin (LIPITOR) 40 MG tablet TAKE 1 TABLET BY MOUTH EVERY EVENING AT 6 PM 30 tablet 0  . carvedilol (COREG) 25 MG tablet Take 1 tablet (25 mg total) by mouth 2 (two) times daily with  a meal. Take 25 mg twice daily 180 tablet 3  . clopidogrel (PLAVIX) 75 MG tablet Take 1 tablet (75 mg total) by mouth daily. 90 tablet 1  . doxazosin (CARDURA) 4 MG tablet Take 1 tablet (4 mg total) by mouth daily. 90 tablet 1  . ferrous sulfate 325 (65 FE) MG tablet Take 1 tablet (325 mg total) by mouth daily. 30 tablet 3  . furosemide (LASIX) 40 MG tablet Take 1 tablet (40 mg total) by mouth daily. 30 tablet 1  . glucose blood (CONTOUR NEXT TEST) test strip 1 each by Other route 2 (two) times daily. And lancets 2/day 100 each 12  . hydrALAZINE (APRESOLINE) 100 MG tablet Take 1 tablet (100 mg total) by mouth every 6 (six) hours. 180 tablet 1  . insulin glargine (LANTUS) 100 unit/mL SOPN Inject 0.2 mLs (20 Units total) into the skin at bedtime. 15 mL 11  . levETIRAcetam (KEPPRA) 500 MG tablet TAKE 1 TABLET(500 MG) BY MOUTH TWICE DAILY 60 tablet 0  . levETIRAcetam (KEPPRA) 500 MG tablet TAKE 1 TABLET(500 MG) BY MOUTH TWICE DAILY 60 tablet 0  . meclizine (ANTIVERT) 25 MG tablet TAKE 1 TABLET(25 MG) BY MOUTH THREE TIMES DAILY AS NEEDED FOR DIZZINESS 90 tablet 0  . oxyCODONE (OXY IR/ROXICODONE) 5 MG immediate release tablet Take 1 tablet (5 mg total) by mouth every 4 (four) hours as needed for moderate pain. 20 tablet 0  . pantoprazole (PROTONIX) 40 MG tablet TAKE 1 TABLET BY MOUTH DAILY 30 tablet 0   No current facility-administered medications for this visit.     Allergies:   Lisinopril; Penicillins; and Gabapentin    Social History:  The patient  reports that she has never smoked. She has never used smokeless tobacco. She reports that she does not drink alcohol or use drugs.   Family History:  The patient's family history includes Breast cancer (age of onset: 37) in her sister; CAD in her mother; Cancer in an other family member; Diabetes in an other family member; Heart disease in an other family member; Heart failure in her mother; Seizures in her maternal uncle; Vascular Disease in her  mother.    ROS:  Please see the history of present illness.   Otherwise, review of systems are positive for none.   All other systems are reviewed and negative.    PHYSICAL EXAM: VS:  There were no vitals taken for this visit. , BMI There is no height or weight on file to calculate BMI. GENERAL:  Well appearing HEENT: Pupils equal round and reactive, fundi not visualized, oral mucosa unremarkable NECK:  No jugular venous distention, waveform within normal limits, carotid upstroke brisk and symmetric, no bruits, + thyromegaly LUNGS:  Clear to auscultation bilaterally  HEART:  RRR.  PMI not displaced or sustained,S1 and S2 within normal limits, no S3, no S4, no clicks, no rubs, no murmurs ABD:  Flat, positive bowel sounds normal in frequency in pitch, no bruits, no rebound, no guarding, no midline pulsatile mass, no hepatomegaly, no splenomegaly EXT:  2 plus pulses throughout, 1+ LE edema to the ankles bilaterally, no cyanosis no clubbing SKIN:  No rashes no nodules NEURO:  Cranial nerves II through XII grossly intact, motor grossly intact throughout PSYCH:  Cognitively intact, oriented to person place and time   EKG:  EKG is ordered today. The ekg ordered 12/14/16 demonstrates sinus rhythm.  Rate 75 bpm.  Cannot rule out prior septal infarct.   09/18/17: Sinus rhythm.  Rate 78 bpm.  Cannot rule out prior septal infarct.   Recent Labs: 07/27/2017: TSH 1.883 03/28/2018: Magnesium 2.6 04/03/2018: ALT 30 04/12/2018: BUN 38; Creatinine, Ser 2.27; Hemoglobin 8.5; Platelets 326; Potassium 3.9; Sodium 137    Lipid Panel    Component Value Date/Time   CHOL 99 07/26/2017 1118   TRIG 138 03/27/2018 0414   HDL 42 07/26/2017 1118   CHOLHDL 2.4 07/26/2017 1118   VLDL 14 07/26/2017 1118   LDLCALC 43 07/26/2017 1118      Wt Readings from Last 3 Encounters:  06/26/18 185 lb (83.9 kg)  05/30/18 187 lb (84.8 kg)  05/23/18 190 lb 3.2 oz (86.3 kg)      ASSESSMENT AND PLAN:  # Hypertensive  heart disease:   BP remains above goal. She shouldn't be on both HCTZ and furosemide, especially given her renal function.  We will stop HCTZ and increase lasix to 80mg  bid.  Increase amlodipine to 10mg .  Continue carvedilol and doxazosin.  Increase hydralazine to 100mg  tid.  Repeat BMP in 1 week.  # Acute on chronic renal failure: Stop HCTZ as above.  She is going to see nephrology.   # Recurrent stroke: BP control as above.  Continue atorvastatin and clopidogrel.   Current medicines are reviewed at length with the patient today.  The patient does not have concerns regarding medicines.  The following changes have been made:    Labs/ tests ordered today include:   No orders of the defined types were placed in this encounter.    Disposition:   FU with Anjelina Dung C. Oval Linsey, MD, [facility] in 2 months.  PharmD in 2 weeks.     Signed, Donavyn Fecher C. Oval Linsey, MD, [facility]  06/28/2018 8:04 AM    [place]

## 2018-06-30 LAB — CUP PACEART REMOTE DEVICE CHECK
MDC IDC PG IMPLANT DT: 20190318
MDC IDC SESS DTM: 20191204204037

## 2018-07-02 ENCOUNTER — Encounter: Payer: Self-pay | Admitting: Cardiology

## 2018-07-02 ENCOUNTER — Ambulatory Visit (INDEPENDENT_AMBULATORY_CARE_PROVIDER_SITE_OTHER): Payer: BLUE CROSS/BLUE SHIELD | Admitting: Cardiology

## 2018-07-02 VITALS — BP 154/72 | HR 76 | Ht 62.5 in | Wt 187.2 lb

## 2018-07-02 DIAGNOSIS — I6930 Unspecified sequelae of cerebral infarction: Secondary | ICD-10-CM | POA: Diagnosis not present

## 2018-07-02 DIAGNOSIS — I119 Hypertensive heart disease without heart failure: Secondary | ICD-10-CM | POA: Diagnosis not present

## 2018-07-02 DIAGNOSIS — N183 Chronic kidney disease, stage 3 unspecified: Secondary | ICD-10-CM

## 2018-07-02 DIAGNOSIS — I10 Essential (primary) hypertension: Secondary | ICD-10-CM

## 2018-07-02 DIAGNOSIS — R569 Unspecified convulsions: Secondary | ICD-10-CM

## 2018-07-02 MED ORDER — HYDRALAZINE HCL 50 MG PO TABS: 50.0000 mg | ORAL_TABLET | Freq: Four times a day (QID) | ORAL | 6 refills | Status: DC

## 2018-07-02 NOTE — Progress Notes (Signed)
07/02/2018 Christina Orozco   [birth year]  [id number]  Primary Physician Lanae Boast, [place] Primary Cardiologist: Dr Oval Linsey  HPI:  48 y/o female with a history of HTN, HCVD with severe LVH, IDDM, and recurrent CVA. She had seen Dr Oval Linsey 07/19/17 and her B/P had been under pretty good control-120/72. She was then admitted to [facility] with a small pontine CVA feb 2019.  Her main complaint on presentation was dizziness. Her B/P on admission was 148/85.  She was placed on full dose ASA and Plavix.  Echo was unchanged from previous studies showing her EF to be 65-70% with severe LVH, grade 1 DD, and mild MR.  Carotid dopplers showed 1-39% narrowing bilaterally.  In March 2019 she had a loop recorder placed and a TEE (negative for PFO or clot).    She was then admitted in Oct 2019 after she was found down in post ictal state at the [employer].  While in CT she had a seizure and she was intubated.  Her Glucose admission was > 500 and her B/P 220/ 95.  It's presumed she had been non compliant with her medications as she has had problems affording medications in the past.  She was extubated 03/29/18 but had evidence of encephalopathy and she was discharged to in patient rehab.    She was seen by Almyra Deforest 04/26/18 and seemed to be doing better.  She has been monitoring her B/P and brought those readings in with her.  Her B/P is averaging close to 125/85.   She complains of dizziness after taking Hydralazine and wants to cut this back.    Current Outpatient Medications  Medication Sig Dispense Refill  . albuterol (PROAIR HFA) 108 (90 Base) MCG/ACT inhaler INHALE 1 OR 2 PUFFS INTO THE LUNGS EVERY 6 HOURS AS NEEDED FOR WHEEZING OR SHORTNESS OF BREATH 8.5 g 0  . amLODipine (NORVASC) 10 MG tablet Take 1 tablet (10 mg total) by mouth daily. 90 tablet 3  . atorvastatin (LIPITOR) 40 MG tablet TAKE 1 TABLET BY MOUTH EVERY EVENING AT 6 PM 30 tablet 0  . carvedilol (COREG) 25 MG tablet Take 1 tablet (25 mg  total) by mouth 2 (two) times daily with a meal. Take 25 mg twice daily 180 tablet 3  . clopidogrel (PLAVIX) 75 MG tablet Take 1 tablet (75 mg total) by mouth daily. 90 tablet 1  . doxazosin (CARDURA) 4 MG tablet Take 1 tablet (4 mg total) by mouth daily. 90 tablet 1  . ferrous sulfate 325 (65 FE) MG tablet Take 1 tablet (325 mg total) by mouth daily. 30 tablet 3  . furosemide (LASIX) 40 MG tablet Take 1 tablet (40 mg total) by mouth daily. 30 tablet 1  . glucose blood (CONTOUR NEXT TEST) test strip 1 each by Other route 2 (two) times daily. And lancets 2/day 100 each 12  . hydrALAZINE (APRESOLINE) 100 MG tablet Take 1 tablet (100 mg total) by mouth every 6 (six) hours. 180 tablet 1  . insulin glargine (LANTUS) 100 unit/mL SOPN Inject 0.2 mLs (20 Units total) into the skin at bedtime. 15 mL 11  . levETIRAcetam (KEPPRA) 500 MG tablet TAKE 1 TABLET(500 MG) BY MOUTH TWICE DAILY 60 tablet 0  . meclizine (ANTIVERT) 25 MG tablet TAKE 1 TABLET(25 MG) BY MOUTH THREE TIMES DAILY AS NEEDED FOR DIZZINESS 90 tablet 0  . pantoprazole (PROTONIX) 40 MG tablet TAKE 1 TABLET BY MOUTH DAILY 30 tablet 0   No current facility-administered medications for  this visit.     Allergies  Allergen Reactions  . Lisinopril Swelling and Other (See Comments)    Facial swelling   . Penicillins Hives    Has patient had a PCN reaction causing immediate rash, facial/tongue/throat swelling, SOB or lightheadedness with hypotension: yes Has patient had a PCN reaction causing severe rash involving mucus membranes or skin necrosis: No Has patient had a PCN reaction that required hospitalization No Has patient had a PCN reaction occurring within the last 10 years: No If all of the above answers are "NO", then may proceed wit  . Gabapentin Swelling and Other (See Comments)    "I thought I was having another stroke."     Past Medical History:  Diagnosis Date  . Anemia    severe  . Asthma   . CKD (chronic kidney disease)     stage IV  . DDD (degenerative disc disease), cervical   . Diabetes mellitus ([place])   . Dislocated shoulder    right  . GERD (gastroesophageal reflux disease)   . Hypertension   . Neuropathy   . Stroke [facility])     Social History   Socioeconomic History  . Marital status: Single    Spouse name: Not on file  . Number of children: 3  . Years of education: 12+  . Highest education level: Some college, no degree  Occupational History  . Not on file  Social Needs  . Financial resource strain: Not on file  . Food insecurity:    Worry: Not on file    Inability: Not on file  . Transportation needs:    Medical: Not on file    Non-medical: Not on file  Tobacco Use  . Smoking status: Never Smoker  . Smokeless tobacco: Never Used  Substance and Sexual Activity  . Alcohol use: No  . Drug use: No  . Sexual activity: Not on file  Lifestyle  . Physical activity:    Days per week: Not on file    Minutes per session: Not on file  . Stress: Not on file  Relationships  . Social connections:    Talks on phone: Not on file    Gets together: Not on file    Attends religious service: Not on file    Active member of club or organization: Not on file    Attends meetings of clubs or organizations: Not on file    Relationship status: Not on file  . Intimate partner violence:    Fear of current or ex partner: Not on file    Emotionally abused: Not on file    Physically abused: Not on file    Forced sexual activity: Not on file  Other Topics Concern  . Not on file  Social History Narrative   Lives with daughter   Caffeine use: "once in a blue moon"   Right handed     Family History  Problem Relation Age of Onset  . Vascular Disease Mother   . CAD Mother   . Heart failure Mother   . Heart disease Other   . Cancer Other        colon, stomach, pancreatic, lung  . Diabetes Other   . Breast cancer Sister 32  . Seizures Maternal Uncle      Review of Systems: General: negative for  chills, fever, night sweats or weight changes.  Cardiovascular: negative for chest pain, dyspnea on exertion, edema, orthopnea, palpitations, paroxysmal nocturnal dyspnea or shortness of breath Dermatological: negative  for rash Respiratory: negative for cough or wheezing Urologic: negative for hematuria Abdominal: negative for nausea, vomiting, diarrhea, bright red blood per rectum, melena, or hematemesis Neurologic: negative for visual changes, syncope All other systems reviewed and are otherwise negative except as noted above.    Blood pressure (!) 154/72, pulse 76, height 5' 2.5" (1.588 m), weight 187 lb 3.2 oz (84.9 kg), SpO2 99 %.  General appearance: alert, cooperative and no distress Lungs: clear to auscultation bilaterally Heart: regular rate and rhythm Extremities: no edema Neurologic: Grossly normal   ASSESSMENT AND PLAN:   Hypertensive cardiovascular disease Normal LVF with severe LVH and grade 1 DD on echo Feb 2019  Seizure secondary to hypertensive crisis and resultant encephalopathy Oct 2019 Secondary to medication non compliance (finances) prior to that admission  CVA (cerebral vascular accident) ([place]) H/O CVA 2017 with recurrent pontine CVA Feb 2019  Chronic kidney disease (CKD), stage III (moderate) Last SCr 2.27  Diabetes mellitus type 2, with complication, on long term insulin pump (HCC) Type 2 IDDM with CRI-3   PLAN  She is pretty insistent on making this medication change. I explained that there was nothing to adjust otherwise if her B/P goes back up.  She'll continue to monitor her B/P and let us know.  Check a BMP, it may be worthwhile to refer her to a nephrologist if her SCr is not improved.   F/U Dr Oval Linsey as scheduled.   Kerin Ransom PA-C 07/02/2018 11:42 AM

## 2018-07-02 NOTE — Patient Instructions (Addendum)
Medication Instructions:  DECREASE Hydralazine to 50mg  Take 1 tablet every 6 hours (4 times a day)  If you need a refill on your cardiac medications before your next appointment, please call your pharmacy.   Lab work: None  If you have labs (blood work) drawn today and your tests are completely normal, you will receive your results only by: Marland Kitchen MyChart Message (if you have MyChart) OR . A paper copy in the mail If you have any lab test that is abnormal or we need to change your treatment, we will call you to review the results.  Testing/Procedures: None   Follow-Up: At [facility], you and your health needs are our priority.  As part of our continuing mission to provide you with exceptional heart care, we have created designated Provider Care Teams.  These Care Teams include your primary Cardiologist (physician) and Advanced Practice Providers (APPs -  Physician Assistants and Nurse Practitioners) who all work together to provide you with the care you need, when you need it.  . Your physician recommends that you schedule a follow-up appointment in: 4-6 weeks with Dr Oval Linsey ONLY.  Any Other Special Instructions Will Be Listed Below (If Applicable). Blood pressure log give and advised patient to check her blood pressure daily

## 2018-07-05 ENCOUNTER — Other Ambulatory Visit: Payer: Self-pay

## 2018-07-05 ENCOUNTER — Telehealth: Payer: Self-pay

## 2018-07-05 DIAGNOSIS — N183 Chronic kidney disease, stage 3 unspecified: Secondary | ICD-10-CM

## 2018-07-05 LAB — BASIC METABOLIC PANEL
BUN/Creatinine Ratio: 13 (ref 9–23)
BUN: 28 mg/dL — ABNORMAL HIGH (ref 6–24)
CO2: 19 mmol/L — ABNORMAL LOW (ref 20–29)
Calcium: 8.7 mg/dL (ref 8.7–10.2)
Chloride: 110 mmol/L — ABNORMAL HIGH (ref 96–106)
Creatinine, Ser: 2.21 mg/dL — ABNORMAL HIGH (ref 0.57–1.00)
GFR calc Af Amer: 30 mL/min/{1.73_m2} — ABNORMAL LOW (ref >59)
GFR calc non Af Amer: 26 mL/min/{1.73_m2} — ABNORMAL LOW (ref >59)
Glucose: 139 mg/dL — ABNORMAL HIGH (ref 65–99)
Potassium: 4 mmol/L (ref 3.5–5.2)
Sodium: 145 mmol/L — ABNORMAL HIGH (ref 134–144)

## 2018-07-05 NOTE — Telephone Encounter (Signed)
Left detailed message for the patient and told her she could call back if she has any questions.

## 2018-07-05 NOTE — Progress Notes (Signed)
Left detailed message for the patient and told her she could call back if she has any questions.

## 2018-07-15 ENCOUNTER — Ambulatory Visit (INDEPENDENT_AMBULATORY_CARE_PROVIDER_SITE_OTHER): Payer: BLUE CROSS/BLUE SHIELD | Admitting: Family Medicine

## 2018-07-15 VITALS — BP 139/64 | HR 83 | Temp 99.5°F | Resp 16 | Ht 62.5 in | Wt 195.0 lb

## 2018-07-15 DIAGNOSIS — E1142 Type 2 diabetes mellitus with diabetic polyneuropathy: Secondary | ICD-10-CM

## 2018-07-15 DIAGNOSIS — I10 Essential (primary) hypertension: Secondary | ICD-10-CM

## 2018-07-15 DIAGNOSIS — K219 Gastro-esophageal reflux disease without esophagitis: Secondary | ICD-10-CM | POA: Diagnosis not present

## 2018-07-15 DIAGNOSIS — F418 Other specified anxiety disorders: Secondary | ICD-10-CM

## 2018-07-15 DIAGNOSIS — E1165 Type 2 diabetes mellitus with hyperglycemia: Secondary | ICD-10-CM

## 2018-07-15 DIAGNOSIS — R569 Unspecified convulsions: Secondary | ICD-10-CM

## 2018-07-15 LAB — POCT URINALYSIS DIPSTICK
Bilirubin, UA: NEGATIVE
Blood, UA: NEGATIVE
Glucose, UA: NEGATIVE
Ketones, UA: NEGATIVE
Leukocytes, UA: NEGATIVE
Nitrite, UA: NEGATIVE
Protein, UA: POSITIVE — AB
Spec Grav, UA: 1.015 (ref 1.010–1.025)
Urobilinogen, UA: 0.2 E.U./dL
pH, UA: 5.5 (ref 5.0–8.0)

## 2018-07-15 LAB — POCT GLYCOSYLATED HEMOGLOBIN (HGB A1C): Hemoglobin A1C: 6.3 % — AB (ref 4.0–5.6)

## 2018-07-15 LAB — POCT CBG (FASTING - GLUCOSE)-MANUAL ENTRY: Glucose Fasting, POC: 83 mg/dL (ref 70–99)

## 2018-07-15 MED ORDER — PANTOPRAZOLE SODIUM 40 MG PO TBEC: 40.0000 mg | DELAYED_RELEASE_TABLET | Freq: Every day | ORAL | 5 refills | Status: DC

## 2018-07-15 MED ORDER — HYDROXYZINE HCL 10 MG PO TABS: 10.0000 mg | ORAL_TABLET | Freq: Three times a day (TID) | ORAL | 0 refills | Status: DC | PRN

## 2018-07-15 MED ORDER — LEVETIRACETAM 500 MG PO TABS: ORAL_TABLET | ORAL | 5 refills | Status: DC

## 2018-07-15 NOTE — Patient Instructions (Signed)
[facility] Address: [address], [place], [place] [address]  Phone: [phone]  [place]  [place]  [phone]  [facility] in [place], [place] in: [place] office center Address: [address] [place], [place], [place] [address] Phone: [phone] Hydroxyzine capsules or tablets What is this medicine? HYDROXYZINE (hye [place] i zeen) is an antihistamine. This medicine is used to treat allergy symptoms. It is also used to treat anxiety and tension. This medicine can be used with other medicines to induce sleep before surgery. This medicine may be used for other purposes; ask your health care provider or pharmacist if you have questions. COMMON BRAND NAME(S): ANX, Atarax, Rezine, Vistaril What should I tell my health care provider before I take this medicine? They need to know if you have any of these conditions: -glaucoma -heart disease -history of irregular heartbeat -kidney disease -liver disease -lung or breathing disease, like asthma -stomach or intestine problems -thyroid disease -trouble passing urine -an unusual or allergic reaction to hydroxyzine, cetirizine, other medicines, foods, dyes or preservatives -pregnant or trying to get pregnant -breast-feeding How should I use this medicine? Take this medicine by mouth with a full glass of water. Follow the directions on the prescription label. You may take this medicine with food or on an empty stomach. Take your medicine at regular intervals. Do not take your medicine more often than directed. Talk to your pediatrician regarding the use of this medicine in children. Special care may be needed. While this drug may be prescribed for children as young as 43 years of age for selected conditions, precautions do apply. Patients over 32 years old may have a stronger  reaction and need a smaller dose. Overdosage: If you think you have taken too much of this medicine contact a poison control center or emergency room at once. NOTE: This medicine is only for you. Do not share this medicine with others. What if I miss a dose? If you miss a dose, take it as soon as you can. If it is almost time for your next dose, take only that dose. Do not take double or extra doses. What may interact with this medicine? Do not take this medicine with any of the following medications: -cisapride -dofetilide -dronedarone -pimozide -thioridazine This medicine may also interact with the following medications: -alcohol -antihistamines for allergy, cough, and cold -atropine -barbiturate medicines for sleep or seizures, like phenobarbital -certain antibiotics like erythromycin or clarithromycin -certain medicines for anxiety or sleep -certain medicines for bladder problems like oxybutynin, tolterodine -certain medicines for depression or psychotic disturbances -certain medicines for irregular heart beat -certain medicines for Parkinson's disease like benztropine, trihexyphenidyl -certain medicines for seizures like phenobarbital, primidone -certain medicines for stomach problems like dicyclomine, hyoscyamine -certain medicines for travel sickness like scopolamine -ipratropium -narcotic medicines for pain -other medicines that prolong the QT interval (an abnormal heart rhythm) This list may not describe all possible interactions. Give your health care provider a list of all the medicines, herbs, non-prescription drugs, or dietary supplements you use. Also tell them if you smoke, drink alcohol, or use illegal drugs. Some items may interact with your medicine. What should I watch for while using this medicine? Tell your doctor or health care professional if your symptoms do not improve. You may get drowsy or dizzy. Do not drive, use machinery, or do anything that needs mental  alertness until  you know how this medicine affects you. Do not stand or sit up quickly, especially if you are an older patient. This reduces the risk of dizzy or fainting spells. Alcohol may interfere with the effect of this medicine. Avoid alcoholic drinks. Your mouth may get dry. Chewing sugarless gum or sucking hard candy, and drinking plenty of water may help. Contact your doctor if the problem does not go away or is severe. This medicine may cause dry eyes and blurred vision. If you wear contact lenses you may feel some discomfort. Lubricating drops may help. See your eye doctor if the problem does not go away or is severe. If you are receiving skin tests for allergies, tell your doctor you are using this medicine. What side effects may I notice from receiving this medicine? Side effects that you should report to your doctor or health care professional as soon as possible: -allergic reactions like skin rash, itching or hives, swelling of the face, lips, or tongue -changes in vision -confusion -fast, irregular heartbeat -seizures -tremor -trouble passing urine or change in the amount of urine Side effects that usually do not require medical attention (report to your doctor or health care professional if they continue or are bothersome): -constipation -drowsiness -dry mouth -headache -tiredness This list may not describe all possible side effects. Call your doctor for medical advice about side effects. You may report side effects to FDA at 1-800-FDA-1088. Where should I keep my medicine? Keep out of the reach of children. Store at room temperature between 15 and 30 degrees C (59 and 86 degrees F). Keep container tightly closed. Throw away any unused medicine after the expiration date. NOTE: This sheet is a summary. It may not cover all possible information. If you have questions about this medicine, talk to your doctor, pharmacist, or health care provider.  2019 Elsevier/Gold Standard  (2017-12-10 13:25:13)
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
## 2018-07-22 ENCOUNTER — Ambulatory Visit (INDEPENDENT_AMBULATORY_CARE_PROVIDER_SITE_OTHER): Payer: BLUE CROSS/BLUE SHIELD

## 2018-07-22 DIAGNOSIS — I639 Cerebral infarction, unspecified: Secondary | ICD-10-CM | POA: Diagnosis not present

## 2018-07-22 LAB — CUP PACEART REMOTE DEVICE CHECK
Date Time Interrogation Session: 20200208210952
Implantable Pulse Generator Implant Date: 20190318

## 2018-07-23 ENCOUNTER — Other Ambulatory Visit: Payer: Self-pay | Admitting: Family Medicine

## 2018-07-23 DIAGNOSIS — E785 Hyperlipidemia, unspecified: Secondary | ICD-10-CM

## 2018-07-24 ENCOUNTER — Encounter: Payer: BLUE CROSS/BLUE SHIELD | Admitting: Physical Medicine & Rehabilitation

## 2018-08-01 NOTE — Progress Notes (Signed)
Carelink Summary Report / Loop Recorder 

## 2018-08-02 ENCOUNTER — Encounter
Payer: BLUE CROSS/BLUE SHIELD | Attending: Physical Medicine & Rehabilitation | Admitting: Physical Medicine & Rehabilitation

## 2018-08-02 ENCOUNTER — Encounter: Payer: BLUE CROSS/BLUE SHIELD | Admitting: Physical Medicine & Rehabilitation

## 2018-08-02 ENCOUNTER — Encounter: Payer: Self-pay | Admitting: Physical Medicine & Rehabilitation

## 2018-08-02 VITALS — BP 144/88 | HR 72 | Ht 62.5 in | Wt 191.0 lb

## 2018-08-02 DIAGNOSIS — I10 Essential (primary) hypertension: Secondary | ICD-10-CM | POA: Diagnosis not present

## 2018-08-02 DIAGNOSIS — R51 Headache: Secondary | ICD-10-CM

## 2018-08-02 DIAGNOSIS — Z8249 Family history of ischemic heart disease and other diseases of the circulatory system: Secondary | ICD-10-CM | POA: Insufficient documentation

## 2018-08-02 DIAGNOSIS — R5381 Other malaise: Secondary | ICD-10-CM

## 2018-08-02 DIAGNOSIS — E1122 Type 2 diabetes mellitus with diabetic chronic kidney disease: Secondary | ICD-10-CM | POA: Insufficient documentation

## 2018-08-02 DIAGNOSIS — E1142 Type 2 diabetes mellitus with diabetic polyneuropathy: Secondary | ICD-10-CM | POA: Insufficient documentation

## 2018-08-02 DIAGNOSIS — R269 Unspecified abnormalities of gait and mobility: Secondary | ICD-10-CM

## 2018-08-02 DIAGNOSIS — G8929 Other chronic pain: Secondary | ICD-10-CM

## 2018-08-02 DIAGNOSIS — N183 Chronic kidney disease, stage 3 unspecified: Secondary | ICD-10-CM

## 2018-08-02 DIAGNOSIS — E1165 Type 2 diabetes mellitus with hyperglycemia: Secondary | ICD-10-CM

## 2018-08-02 DIAGNOSIS — R569 Unspecified convulsions: Secondary | ICD-10-CM

## 2018-08-02 DIAGNOSIS — G40909 Epilepsy, unspecified, not intractable, without status epilepticus: Secondary | ICD-10-CM | POA: Insufficient documentation

## 2018-08-02 DIAGNOSIS — I129 Hypertensive chronic kidney disease with stage 1 through stage 4 chronic kidney disease, or unspecified chronic kidney disease: Secondary | ICD-10-CM | POA: Insufficient documentation

## 2018-08-02 DIAGNOSIS — R519 Headache, unspecified: Secondary | ICD-10-CM

## 2018-08-02 DIAGNOSIS — R262 Difficulty in walking, not elsewhere classified: Secondary | ICD-10-CM | POA: Insufficient documentation

## 2018-08-02 NOTE — Progress Notes (Addendum)
Subjective:    Patient ID: Christina Orozco, female    DOB: [birth year], 48 y.o.   MRN: [id number]  HPI Right-handed female, multi-medical, CKD stage III, diabetes mellitus and hypertension presents for follow up for debility.   Last clinic visit on 06/26/2018.  Since that time, she states she continues with HEP.  She states she has not heard from ?therapies ordered by Neurology. She states she is following up with Neurology.  BP is relatively controlled. No seizures. She has not followed up with Endo. She needs to follow up with Nephro as well. She fell as getting light-headed.  BP meds were changed by Cards. She states she is more active.    Pain Inventory Average Pain 4 Pain Right Now 7 My pain is intermittent and sharp  In the last 24 hours, has pain interfered with the following? General activity 7 Relation with others 0 Enjoyment of life 7 What TIME of day is your pain at its worst? n/a Sleep (in general) Poor  Pain is worse with: walking, bending, sitting and inactivity Pain improves with: rest and heat/ice Relief from Meds: none  Mobility walk without assistance do you drive?  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  no  Physicians involved in your care Any changes since last visit?  no   Family History  Problem Relation Age of Onset  . Vascular Disease Mother   . CAD Mother   . Heart failure Mother   . Heart disease Other   . Cancer Other        colon, stomach, pancreatic, lung  . Diabetes Other   . Breast cancer Sister 79  . Seizures Maternal Uncle    Social History   Socioeconomic History  . Marital status: Single    Spouse name: Not on file  . Number of children: 3  . Years of education: 12+  . Highest education level: Some college, no degree  Occupational History  . Not on file  Social Needs  . Financial resource strain: Not on file  . Food  insecurity:    Worry: Not on file    Inability: Not on file  . Transportation needs:    Medical: Not on file    Non-medical: Not on file  Tobacco Use  . Smoking status: Never Smoker  . Smokeless tobacco: Never Used  Substance and Sexual Activity  . Alcohol use: No  . Drug use: No  . Sexual activity: Not on file  Lifestyle  . Physical activity:    Days per week: Not on file    Minutes per session: Not on file  . Stress: Not on file  Relationships  . Social connections:    Talks on phone: Not on file    Gets together: Not on file    Attends religious service: Not on file    Active member of club or organization: Not on file    Attends meetings of clubs or organizations: Not on file    Relationship status: Not on file  Other Topics Concern  . Not on file  Social History Narrative   Lives with daughter   Caffeine use: "once in a blue moon"   Right handed   Past Surgical History:  Procedure Laterality Date  . ADENOIDECTOMY    . APPENDECTOMY    . CERVICAL SPINE SURGERY  06/2012   C5-C6  . EYE SURGERY  left eye surgery   . FOOT SURGERY    . LOOP RECORDER INSERTION N/A 08/27/2017   Procedure: LOOP RECORDER INSERTION;  Surgeon: Sanda Klein, MD;  Location: [facility];  Service: Cardiovascular;  Laterality: N/A;  . SHOULDER SURGERY    . TEE WITHOUT CARDIOVERSION N/A 08/27/2017   Procedure: TRANSESOPHAGEAL ECHOCARDIOGRAM (TEE);  Surgeon: Sanda Klein, MD;  Location: [facility] ENDOSCOPY;  Service: Cardiovascular;  Laterality: N/A;  . TONSILLECTOMY     Past Medical History:  Diagnosis Date  . Anemia    severe  . Asthma   . CKD (chronic kidney disease)    stage IV  . DDD (degenerative disc disease), cervical   . Diabetes mellitus ([place])   . Dislocated shoulder    right  . GERD (gastroesophageal reflux disease)   . Hypertension   . Neuropathy   . Stroke ([place])    BP (!) 144/88   Pulse 72   Ht 5' 2.5" (1.588 m)   Wt 191 lb (86.6 kg)   SpO2 98%   BMI 34.38  kg/m   Opioid Risk Score:   Fall Risk Score:  `1  Depression screen PHQ 2/9  Depression screen [facility] 2/9 07/15/2018 05/23/2018 04/26/2018 04/23/2018 04/23/2018 01/10/2018 08/31/2017  Decreased Interest 0 2 1 0 0 0 0  Down, Depressed, Hopeless 1 2 2 1 1  0 0  PHQ - 2 Score 1 4 3 1 1  0 0  Altered sleeping - 2 2 - - - -  Tired, decreased energy - 2 2 - - - -  Change in appetite - 1 1 - - - -  Feeling bad or failure about yourself  - 2 3 - - - -  Trouble concentrating - 0 0 - - - -  Moving slowly or fidgety/restless - 0 0 - - - -  Suicidal thoughts - 0 0 - - - -  PHQ-9 Score - 11 11 - - - -  Difficult doing work/chores - - Somewhat difficult - - - -  Some recent data might be hidden     Review of Systems  Constitutional: Positive for appetite change, chills and fever.  HENT: Negative.   Eyes: Negative.   Respiratory: Negative.   Cardiovascular: Negative.   Gastrointestinal: Positive for constipation.  Endocrine: Negative.   Genitourinary: Negative.   Musculoskeletal: Positive for arthralgias and myalgias.  Skin: Negative.   Allergic/Immunologic: Negative.   Neurological: Positive for dizziness.  Hematological: Negative.   Psychiatric/Behavioral: Positive for confusion and dysphoric mood. The patient is nervous/anxious.   All other systems reviewed and are negative.     Objective:   Physical Exam Constitutional: No distress . Vital signs reviewed. HENT: Normocephalic.  Atraumatic. Eyes: EOMI. No discharge. Cardiovascular: RRR. No JVD. Respiratory: CTA bilaterally. Normal effort. GI: BS +. Non-distended. Musc: No edema or tenderness in extremities. Musculoskeletal: She exhibits mild edema. Neurological: Patient is alert Follows simple commands. Motor: 5/5 throughout Psychiatric: Flat, improving    Assessment & Plan:  48 year old right-handed female, multi-medical, CKD stage III, diabetes mellitus and hypertension presents for follow up for debility.   1.  Limited  endurance secondary to seizures thought to be secondary to uncontrolled hypertension/PRES versus hypoglycemia  Completed therapies, cont HEP  Neurology ordered ?vestibular therapies, states she has not received a follow up call  Cont follow up with Neurology - needs new physician  Has increased activity, will trial gradual return to work - discussed with patient  2.  Seizure disorder.  Cont meds  Follow up with Neurology - see above  3. Hypertension.    Cont meds  Meds being adjusted by Cards  4.  Diabetes mellitus with peripheral neuropathy.    Needs appointment with Endo, encouraged follow up - states she needs to find physician.  States improvement in HbA1c  5. CKD  Needs appointment with Nephro, reminded again when finances allow - needs find a physician

## 2018-08-02 NOTE — Progress Notes (Deleted)
Subjective:    Patient ID: Christina Orozco, female    DOB: 06-18-1970, 48 y.o.   MRN: [id number]  HPI  Pain Inventory Average Pain 4 Pain Right Now 7 My pain is intermittent and sharp  In the last 24 hours, has pain interfered with the following? General activity 7 Relation with others 0 Enjoyment of life 7 What TIME of day is your pain at its worst? na Sleep (in general) Poor  Pain is worse with: walking, bending, sitting and inactivity Pain improves with: rest and heat/ice Relief from Meds: na  Mobility walk without assistance do you drive?  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  no  Physicians involved in your care Any changes since last visit?  no   Family History  Problem Relation Age of Onset  . Vascular Disease Mother   . CAD Mother   . Heart failure Mother   . Heart disease Other   . Cancer Other        colon, stomach, pancreatic, lung  . Diabetes Other   . Breast cancer Sister 33  . Seizures Maternal Uncle    Social History   Socioeconomic History  . Marital status: Single    Spouse name: Not on file  . Number of children: 3  . Years of education: 12+  . Highest education level: Some college, no degree  Occupational History  . Not on file  Social Needs  . Financial resource strain: Not on file  . Food insecurity:    Worry: Not on file    Inability: Not on file  . Transportation needs:    Medical: Not on file    Non-medical: Not on file  Tobacco Use  . Smoking status: Never Smoker  . Smokeless tobacco: Never Used  Substance and Sexual Activity  . Alcohol use: No  . Drug use: No  . Sexual activity: Not on file  Lifestyle  . Physical activity:    Days per week: Not on file    Minutes per session: Not on file  . Stress: Not on file  Relationships  . Social connections:    Talks on phone: Not on file    Gets together: Not on  file    Attends religious service: Not on file    Active member of club or organization: Not on file    Attends meetings of clubs or organizations: Not on file    Relationship status: Not on file  Other Topics Concern  . Not on file  Social History Narrative   Lives with daughter   Caffeine use: "once in a blue moon"   Right handed   Past Surgical History:  Procedure Laterality Date  . ADENOIDECTOMY    . APPENDECTOMY    . CERVICAL SPINE SURGERY  06/2012   C5-C6  . EYE SURGERY     left eye surgery   . FOOT SURGERY    . LOOP RECORDER INSERTION N/A 08/27/2017   Procedure: LOOP RECORDER INSERTION;  Surgeon: Sanda Klein, MD;  Location: [facility];  Service: Cardiovascular;  Laterality: N/A;  . SHOULDER SURGERY    . TEE WITHOUT CARDIOVERSION N/A 08/27/2017   Procedure: TRANSESOPHAGEAL ECHOCARDIOGRAM (TEE);  Surgeon: Sanda Klein, MD;  Location: [facility] ENDOSCOPY;  Service: Cardiovascular;  Laterality: N/A;  . TONSILLECTOMY     Past Medical History:  Diagnosis Date  . Anemia    severe  .  Asthma   . CKD (chronic kidney disease)    stage IV  . DDD (degenerative disc disease), cervical   . Diabetes mellitus ([place])   . Dislocated shoulder    right  . GERD (gastroesophageal reflux disease)   . Hypertension   . Neuropathy   . Stroke ([place])    BP (!) 144/88   Pulse 72   Ht 5' 2.5" (1.588 m)   Wt 191 lb (86.6 kg)   SpO2 98%   BMI 34.38 kg/m   Opioid Risk Score:   Fall Risk Score:  `1  Depression screen PHQ 2/9  Depression screen [facility] 2/9 07/15/2018 05/23/2018 04/26/2018 04/23/2018 04/23/2018 01/10/2018 08/31/2017  Decreased Interest 0 2 1 0 0 0 0  Down, Depressed, Hopeless 1 2 2 1 1  0 0  PHQ - 2 Score 1 4 3 1 1  0 0  Altered sleeping - 2 2 - - - -  Tired, decreased energy - 2 2 - - - -  Change in appetite - 1 1 - - - -  Feeling bad or failure about yourself  - 2 3 - - - -  Trouble concentrating - 0 0 - - - -  Moving slowly or fidgety/restless - 0 0 - - - -  Suicidal  thoughts - 0 0 - - - -  PHQ-9 Score - 11 11 - - - -  Difficult doing work/chores - - Somewhat difficult - - - -  Some recent data might be hidden     Review of Systems  Constitutional: Positive for appetite change, chills and fever.  HENT: Negative.   Eyes: Negative.   Respiratory: Negative.   Cardiovascular: Negative.   Gastrointestinal: Positive for constipation.  Endocrine: Negative.   Genitourinary: Negative.   Musculoskeletal: Positive for arthralgias and myalgias.  Skin: Negative.   Allergic/Immunologic: Negative.   Neurological: Positive for dizziness.  Hematological: Negative.   Psychiatric/Behavioral: Positive for decreased concentration. The patient is nervous/anxious.   All other systems reviewed and are negative.      Objective:   Physical Exam        Assessment & Plan:

## 2018-08-14 ENCOUNTER — Telehealth: Payer: Self-pay

## 2018-08-14 ENCOUNTER — Ambulatory Visit: Payer: BLUE CROSS/BLUE SHIELD | Admitting: Cardiovascular Disease

## 2018-08-15 ENCOUNTER — Telehealth: Payer: Self-pay

## 2018-08-15 NOTE — Telephone Encounter (Signed)
Left a vm for patient to callback 

## 2018-08-16 NOTE — Telephone Encounter (Signed)
Patient has to find out which places accept her [place].

## 2018-08-19 ENCOUNTER — Other Ambulatory Visit: Payer: Self-pay | Admitting: Family Medicine

## 2018-08-19 DIAGNOSIS — E785 Hyperlipidemia, unspecified: Secondary | ICD-10-CM

## 2018-08-22 ENCOUNTER — Ambulatory Visit (INDEPENDENT_AMBULATORY_CARE_PROVIDER_SITE_OTHER): Payer: BLUE CROSS/BLUE SHIELD | Admitting: *Deleted

## 2018-08-22 DIAGNOSIS — I639 Cerebral infarction, unspecified: Secondary | ICD-10-CM

## 2018-08-24 LAB — CUP PACEART REMOTE DEVICE CHECK
Date Time Interrogation Session: 20200312213937
Implantable Pulse Generator Implant Date: 20190318

## 2018-08-29 NOTE — Progress Notes (Signed)
Carelink Summary Report / Loop Recorder 

## 2018-09-13 ENCOUNTER — Encounter: Payer: Self-pay | Admitting: Physical Medicine & Rehabilitation

## 2018-09-13 ENCOUNTER — Other Ambulatory Visit: Payer: Self-pay

## 2018-09-13 ENCOUNTER — Encounter
Payer: BLUE CROSS/BLUE SHIELD | Attending: Physical Medicine & Rehabilitation | Admitting: Physical Medicine & Rehabilitation

## 2018-09-13 VITALS — Ht 62.5 in | Wt 191.0 lb

## 2018-09-13 DIAGNOSIS — I129 Hypertensive chronic kidney disease with stage 1 through stage 4 chronic kidney disease, or unspecified chronic kidney disease: Secondary | ICD-10-CM | POA: Insufficient documentation

## 2018-09-13 DIAGNOSIS — R262 Difficulty in walking, not elsewhere classified: Secondary | ICD-10-CM | POA: Insufficient documentation

## 2018-09-13 DIAGNOSIS — R51 Headache: Secondary | ICD-10-CM | POA: Insufficient documentation

## 2018-09-13 DIAGNOSIS — Z8249 Family history of ischemic heart disease and other diseases of the circulatory system: Secondary | ICD-10-CM | POA: Insufficient documentation

## 2018-09-13 DIAGNOSIS — R269 Unspecified abnormalities of gait and mobility: Secondary | ICD-10-CM

## 2018-09-13 DIAGNOSIS — N183 Chronic kidney disease, stage 3 unspecified: Secondary | ICD-10-CM

## 2018-09-13 DIAGNOSIS — G40909 Epilepsy, unspecified, not intractable, without status epilepticus: Secondary | ICD-10-CM | POA: Insufficient documentation

## 2018-09-13 DIAGNOSIS — E1122 Type 2 diabetes mellitus with diabetic chronic kidney disease: Secondary | ICD-10-CM | POA: Insufficient documentation

## 2018-09-13 DIAGNOSIS — R42 Dizziness and giddiness: Secondary | ICD-10-CM | POA: Insufficient documentation

## 2018-09-13 DIAGNOSIS — R569 Unspecified convulsions: Secondary | ICD-10-CM

## 2018-09-13 DIAGNOSIS — R5381 Other malaise: Secondary | ICD-10-CM

## 2018-09-13 DIAGNOSIS — E1142 Type 2 diabetes mellitus with diabetic polyneuropathy: Secondary | ICD-10-CM

## 2018-09-13 DIAGNOSIS — I10 Essential (primary) hypertension: Secondary | ICD-10-CM

## 2018-09-13 DIAGNOSIS — E1165 Type 2 diabetes mellitus with hyperglycemia: Secondary | ICD-10-CM

## 2018-09-13 NOTE — Progress Notes (Signed)
Subjective:    Patient ID: Christina Orozco, female    DOB: 08-Oct-1970, 48 y.o.   MRN: [id number]  TELEHEALTH NOTE  Due to national recommendations of social distancing due to COVID 19, an audio/video telehealth visit is felt to be most appropriate for this patient at this time.  See Chart message from today for the patient's consent to telehealth from [place].     I verified that I am speaking with the correct person using two identifiers.  Location of patient: Home Location of provider: Office Method of communication: Webex Names of participants : Zorita Pang scheduling, Kennon Rounds obtaining consent and vitals if available Established patient Time spent on call: 14 minutes   HPI Right-handed female, multi-medical, CKD stage III, diabetes mellitus and hypertension presents for follow up for debility.   Last clinic visit on 08/02/2018.  Since last visit, she continues to do HEP.  She states she never received a call for vestibular therapies.  She still has not found a new Neurologist.  She still has not increased her activity.  Antiepileptics are being prescribed by PCP. She has not found Endo or Nephro.  Denies falls.  She is depressed because she states she was recently fired from her work.  Pain Inventory Average Pain 0 Pain Right Now 0 My pain is na  In the last 24 hours, has pain interfered with the following? General activity 0 Relation with others 0 Enjoyment of life 0 What TIME of day is your pain at its worst? n/a Sleep (in general) Poor  Pain is worse with: na Pain improves with: na Relief from Meds: none  Mobility walk without assistance how many minutes can you walk? 30 ability to climb steps?  yes do you drive?  no  Function not employed: date last employed na  Neuro/Psych trouble walking spasms dizziness depression anxiety  Prior Studies Any changes since last visit?  no  Physicians involved in your care  Any changes since last visit?  no Psychiatrist MD Live- [place] [facility]   Family History  Problem Relation Age of Onset  . Vascular Disease Mother   . CAD Mother   . Heart failure Mother   . Heart disease Other   . Cancer Other        colon, stomach, pancreatic, lung  . Diabetes Other   . Breast cancer Sister 13  . Seizures Maternal Uncle    Social History   Socioeconomic History  . Marital status: Single    Spouse name: Not on file  . Number of children: 3  . Years of education: 12+  . Highest education level: Some college, no degree  Occupational History  . Not on file  Social Needs  . Financial resource strain: Not on file  . Food insecurity:    Worry: Not on file    Inability: Not on file  . Transportation needs:    Medical: Not on file    Non-medical: Not on file  Tobacco Use  . Smoking status: Never Smoker  . Smokeless tobacco: Never Used  Substance and Sexual Activity  . Alcohol use: No  . Drug use: No  . Sexual activity: Not on file  Lifestyle  . Physical activity:    Days per week: Not on file    Minutes per session: Not on file  . Stress: Not on file  Relationships  . Social connections:    Talks on phone: Not on file    Gets  together: Not on file    Attends religious service: Not on file    Active member of club or organization: Not on file    Attends meetings of clubs or organizations: Not on file    Relationship status: Not on file  Other Topics Concern  . Not on file  Social History Narrative   Lives with daughter   Caffeine use: "once in a blue moon"   Right handed   Past Surgical History:  Procedure Laterality Date  . ADENOIDECTOMY    . APPENDECTOMY    . CERVICAL SPINE SURGERY  06/2012   C5-C6  . EYE SURGERY     left eye surgery   . FOOT SURGERY    . LOOP RECORDER INSERTION N/A 08/27/2017   Procedure: LOOP RECORDER INSERTION;  Surgeon: Sanda Klein, MD;  Location: [facility];  Service: Cardiovascular;  Laterality: N/A;  .  SHOULDER SURGERY    . TEE WITHOUT CARDIOVERSION N/A 08/27/2017   Procedure: TRANSESOPHAGEAL ECHOCARDIOGRAM (TEE);  Surgeon: Sanda Klein, MD;  Location: [facility] ENDOSCOPY;  Service: Cardiovascular;  Laterality: N/A;  . TONSILLECTOMY     Past Medical History:  Diagnosis Date  . Anemia    severe  . Asthma   . CKD (chronic kidney disease)    stage IV  . DDD (degenerative disc disease), cervical   . Diabetes mellitus ([place])   . Dislocated shoulder    right  . GERD (gastroesophageal reflux disease)   . Hypertension   . Neuropathy   . Stroke ([place])    Ht 5' 2.5" (1.588 m)   Wt 191 lb (86.6 kg)   BMI 34.38 kg/m   Opioid Risk Score:   Fall Risk Score:  `1  Depression screen PHQ 2/9  Depression screen [facility] 2/9 09/13/2018 07/15/2018 05/23/2018 04/26/2018 04/23/2018 04/23/2018 01/10/2018  Decreased Interest 1 0 2 1 0 0 0  Down, Depressed, Hopeless 1 1 2 2 1 1  0  PHQ - 2 Score 2 1 4 3 1 1  0  Altered sleeping - - 2 2 - - -  Tired, decreased energy - - 2 2 - - -  Change in appetite - - 1 1 - - -  Feeling bad or failure about yourself  - - 2 3 - - -  Trouble concentrating - - 0 0 - - -  Moving slowly or fidgety/restless - - 0 0 - - -  Suicidal thoughts - - 0 0 - - -  PHQ-9 Score - - 11 11 - - -  Difficult doing work/chores - - - Somewhat difficult - - -  Some recent data might be hidden     Review of Systems  Constitutional: Positive for appetite change and chills. Negative for fever.  HENT: Negative.   Eyes: Positive for discharge and visual disturbance.  Respiratory: Negative.   Cardiovascular: Negative.   Gastrointestinal: Positive for constipation.  Endocrine: Negative.   Genitourinary: Negative.   Musculoskeletal: Positive for arthralgias and myalgias.  Skin: Negative.   Allergic/Immunologic: Negative.   Neurological: Positive for dizziness.  Hematological: Negative.   Psychiatric/Behavioral: Positive for confusion and dysphoric mood. The patient is nervous/anxious.   All  other systems reviewed and are negative.     Objective:   Physical Exam  Gen: NAD. HENT: Normocephalic, Atraumatic Eyes: EOMI. No discharge.  Cardio: No JVD. Pulm: Effort normal Neuro: Alert and oriented    Assessment & Plan:  48 year old right-handed female, multi-medical, CKD stage III, diabetes mellitus and hypertension presents  for follow up for debility.   1.  Limited endurance secondary to seizures thought to be secondary to uncontrolled hypertension/PRES versus hypoglycemia, continues to have dizziness  Completed therapies, Cont HEP  Ordered vestibular therapies  Needs Neurologist  Has not increased activity  2.  Seizure disorder.    Cont meds  Follow up with Neurology - see above  3. Hypertension.    Cont meds  Continues to be high per pt, follow up with PCP  4.  Diabetes mellitus with peripheral neuropathy.    Needs appointment with Endo, encouraged follow up - states she needs to find physician, encouraged again, follow up at [facility].  5. CKD  Needs appointment with Nephro, reminded again, encouraged follow up at [facility]  6. Psych  Cont follow up for Psychiatry and therapist  Meds per Psych

## 2018-09-20 ENCOUNTER — Other Ambulatory Visit: Payer: Self-pay | Admitting: Family Medicine

## 2018-09-20 DIAGNOSIS — E785 Hyperlipidemia, unspecified: Secondary | ICD-10-CM

## 2018-09-24 ENCOUNTER — Other Ambulatory Visit: Payer: Self-pay

## 2018-09-24 ENCOUNTER — Ambulatory Visit (INDEPENDENT_AMBULATORY_CARE_PROVIDER_SITE_OTHER): Payer: BLUE CROSS/BLUE SHIELD | Admitting: *Deleted

## 2018-09-24 DIAGNOSIS — I639 Cerebral infarction, unspecified: Secondary | ICD-10-CM | POA: Diagnosis not present

## 2018-09-24 LAB — CUP PACEART REMOTE DEVICE CHECK
Date Time Interrogation Session: 20200414203959
Implantable Pulse Generator Implant Date: 20190318

## 2018-10-01 NOTE — Progress Notes (Signed)
Carelink Summary Report / Loop Recorder 

## 2018-10-09 NOTE — Telephone Encounter (Signed)
Message sent to provider 

## 2018-10-14 ENCOUNTER — Encounter
Payer: BLUE CROSS/BLUE SHIELD | Attending: Physical Medicine & Rehabilitation | Admitting: Physical Medicine & Rehabilitation

## 2018-10-14 ENCOUNTER — Encounter: Payer: Self-pay | Admitting: Physical Medicine & Rehabilitation

## 2018-10-14 ENCOUNTER — Ambulatory Visit: Payer: BLUE CROSS/BLUE SHIELD | Admitting: Family Medicine

## 2018-10-14 ENCOUNTER — Other Ambulatory Visit: Payer: Self-pay

## 2018-10-14 VITALS — Ht 62.5 in

## 2018-10-14 DIAGNOSIS — E1142 Type 2 diabetes mellitus with diabetic polyneuropathy: Secondary | ICD-10-CM

## 2018-10-14 DIAGNOSIS — N183 Chronic kidney disease, stage 3 unspecified: Secondary | ICD-10-CM

## 2018-10-14 DIAGNOSIS — R51 Headache: Secondary | ICD-10-CM | POA: Insufficient documentation

## 2018-10-14 DIAGNOSIS — Z8249 Family history of ischemic heart disease and other diseases of the circulatory system: Secondary | ICD-10-CM | POA: Insufficient documentation

## 2018-10-14 DIAGNOSIS — E1122 Type 2 diabetes mellitus with diabetic chronic kidney disease: Secondary | ICD-10-CM | POA: Insufficient documentation

## 2018-10-14 DIAGNOSIS — R42 Dizziness and giddiness: Secondary | ICD-10-CM

## 2018-10-14 DIAGNOSIS — I129 Hypertensive chronic kidney disease with stage 1 through stage 4 chronic kidney disease, or unspecified chronic kidney disease: Secondary | ICD-10-CM | POA: Insufficient documentation

## 2018-10-14 DIAGNOSIS — R5381 Other malaise: Secondary | ICD-10-CM

## 2018-10-14 DIAGNOSIS — M25552 Pain in left hip: Secondary | ICD-10-CM

## 2018-10-14 DIAGNOSIS — G40909 Epilepsy, unspecified, not intractable, without status epilepticus: Secondary | ICD-10-CM | POA: Insufficient documentation

## 2018-10-14 DIAGNOSIS — R262 Difficulty in walking, not elsewhere classified: Secondary | ICD-10-CM | POA: Insufficient documentation

## 2018-10-14 DIAGNOSIS — I10 Essential (primary) hypertension: Secondary | ICD-10-CM

## 2018-10-14 DIAGNOSIS — R269 Unspecified abnormalities of gait and mobility: Secondary | ICD-10-CM | POA: Insufficient documentation

## 2018-10-14 DIAGNOSIS — E1165 Type 2 diabetes mellitus with hyperglycemia: Secondary | ICD-10-CM

## 2018-10-14 NOTE — Progress Notes (Signed)
Subjective:    Patient ID: Christina Orozco, female    DOB: Nov 21, 1970, 48 y.o.   MRN: [id number]  TELEHEALTH NOTE  Due to national recommendations of social distancing due to COVID 19, an audio/video telehealth visit is felt to be most appropriate for this patient at this time.  See Chart message from today for the patient's consent to telehealth from [facility].     I verified that I am speaking with the correct person using two identifiers.  Location of patient: Home Location of provider: Office Method of communication: Telephone Names of participants : Zorita Pang scheduling, Marland Mcalpine obtaining consent and vitals if available Established patient Time spent on call: 10 minutes   HPI Right-handed female, multi-medical, CKD stage III, diabetes mellitus and hypertension presents for follow up for debility.   Last clinic visit on 09/13/2018.  Since that time, she continues with HEP.  She states she has not heard from vestibular therapy.  She is still trying to establish with Neurology, Endo, Nephro. She states BP her BP has improved. She notes CBGs have been relatively controlled. She notes left hip pain. Denies falls and seizures.   Pain Inventory Average Pain 2 Pain Right Now 2 My pain is sharp and stabbing  In the last 24 hours, has pain interfered with the following? General activity 0 Relation with others 0 Enjoyment of life 0 What TIME of day is your pain at its worst? daytime Sleep (in general) Poor  Pain is worse with: unsure and some activites Pain improves with: rest Relief from Meds: none  Mobility walk without assistance how many minutes can you walk? 30 ability to climb steps?  yes do you drive?  no  Function not employed: date last employed na  Neuro/Psych trouble walking spasms dizziness depression anxiety  Prior Studies Any changes since last visit?  no  Physicians involved in your care Any changes  since last visit?  no Psychiatrist MD Live- [place] [facility]   Family History  Problem Relation Age of Onset  . Vascular Disease Mother   . CAD Mother   . Heart failure Mother   . Heart disease Other   . Cancer Other        colon, stomach, pancreatic, lung  . Diabetes Other   . Breast cancer Sister 53  . Seizures Maternal Uncle    Social History   Socioeconomic History  . Marital status: Single    Spouse name: Not on file  . Number of children: 3  . Years of education: 12+  . Highest education level: Some college, no degree  Occupational History  . Not on file  Social Needs  . Financial resource strain: Not on file  . Food insecurity:    Worry: Not on file    Inability: Not on file  . Transportation needs:    Medical: Not on file    Non-medical: Not on file  Tobacco Use  . Smoking status: Never Smoker  . Smokeless tobacco: Never Used  Substance and Sexual Activity  . Alcohol use: No  . Drug use: No  . Sexual activity: Not on file  Lifestyle  . Physical activity:    Days per week: Not on file    Minutes per session: Not on file  . Stress: Not on file  Relationships  . Social connections:    Talks on phone: Not on file    Gets together: Not on file    Attends religious service:  Not on file    Active member of club or organization: Not on file    Attends meetings of clubs or organizations: Not on file    Relationship status: Not on file  Other Topics Concern  . Not on file  Social History Narrative   Lives with daughter   Caffeine use: "once in a blue moon"   Right handed   Past Surgical History:  Procedure Laterality Date  . ADENOIDECTOMY    . APPENDECTOMY    . CERVICAL SPINE SURGERY  06/2012   C5-C6  . EYE SURGERY     left eye surgery   . FOOT SURGERY    . LOOP RECORDER INSERTION N/A 08/27/2017   Procedure: LOOP RECORDER INSERTION;  Surgeon: Sanda Klein, MD;  Location: [place];  Service: Cardiovascular;  Laterality: N/A;  . SHOULDER  SURGERY    . TEE WITHOUT CARDIOVERSION N/A 08/27/2017   Procedure: TRANSESOPHAGEAL ECHOCARDIOGRAM (TEE);  Surgeon: Sanda Klein, MD;  Location: [facility] ENDOSCOPY;  Service: Cardiovascular;  Laterality: N/A;  . TONSILLECTOMY     Past Medical History:  Diagnosis Date  . Anemia    severe  . Asthma   . CKD (chronic kidney disease)    stage IV  . DDD (degenerative disc disease), cervical   . Diabetes mellitus ([place])   . Dislocated shoulder    right  . GERD (gastroesophageal reflux disease)   . Hypertension   . Neuropathy   . Stroke ([place])    Ht 5' 2.5" (1.588 m)   BMI 34.38 kg/m   Opioid Risk Score:   Fall Risk Score:  `1  Depression screen PHQ 2/9  Depression screen [facility] 2/9 09/13/2018 07/15/2018 05/23/2018 04/26/2018 04/23/2018 04/23/2018 01/10/2018  Decreased Interest 1 0 2 1 0 0 0  Down, Depressed, Hopeless 1 1 2 2 1 1  0  PHQ - 2 Score 2 1 4 3 1 1  0  Altered sleeping - - 2 2 - - -  Tired, decreased energy - - 2 2 - - -  Change in appetite - - 1 1 - - -  Feeling bad or failure about yourself  - - 2 3 - - -  Trouble concentrating - - 0 0 - - -  Moving slowly or fidgety/restless - - 0 0 - - -  Suicidal thoughts - - 0 0 - - -  PHQ-9 Score - - 11 11 - - -  Difficult doing work/chores - - - Somewhat difficult - - -  Some recent data might be hidden     Review of Systems  Constitutional: Positive for appetite change and chills.  HENT: Negative.   Eyes: Positive for discharge and visual disturbance.  Respiratory: Negative.   Cardiovascular: Negative.   Gastrointestinal: Negative.   Endocrine: Negative.   Genitourinary: Negative.   Musculoskeletal: Positive for arthralgias, gait problem and myalgias.  Skin: Negative.   Allergic/Immunologic: Negative.   Neurological: Positive for dizziness.  Hematological: Negative.   Psychiatric/Behavioral: Positive for confusion and dysphoric mood. The patient is nervous/anxious.   All other systems reviewed and are negative.      Objective:   Physical Exam  Gen: NAD. Pulm: Effort normal Neuro: Alert and oriented    Assessment & Plan:  48 year old right-handed female, multi-medical, CKD stage III, diabetes mellitus and hypertension presents for follow up for debility.   1.  Limited endurance secondary to seizures thought to be secondary to uncontrolled hypertension/PRES versus hypoglycemia, continues to have dizziness  Completed therapies,  cont HEP  Ordered vestibular therapies, pt states she did not receive a call  Needs Neurologist - encouraged follow up  Has not increased activity  2.  Seizure disorder.    Cont meds  Follow up with Neurology - see above  3. Hypertension.    Improved per patient  4.  Diabetes mellitus with peripheral neuropathy.    Needs appointment with Endo, encouraged follow up - states she needs to find physician, encouraged again, follow up at [place] she is going to call today/tomorrow   5. CKD  Needs appointment with Nephro, reminded again, encouraged follow up at [place] she is going to call today/tomorrow  6. Psych  Cont follow up for Psychiatry and therapist  Meds per Psych  7. Left hip pain  Appears to describe positive femoral nerve stretch test  ?OA

## 2018-10-17 NOTE — Telephone Encounter (Signed)
Message sent to provider 

## 2018-10-22 ENCOUNTER — Other Ambulatory Visit: Payer: Self-pay | Admitting: Family Medicine

## 2018-10-22 DIAGNOSIS — E785 Hyperlipidemia, unspecified: Secondary | ICD-10-CM

## 2018-10-28 ENCOUNTER — Other Ambulatory Visit: Payer: Self-pay

## 2018-10-28 ENCOUNTER — Ambulatory Visit (INDEPENDENT_AMBULATORY_CARE_PROVIDER_SITE_OTHER): Payer: BLUE CROSS/BLUE SHIELD | Admitting: *Deleted

## 2018-10-28 DIAGNOSIS — I639 Cerebral infarction, unspecified: Secondary | ICD-10-CM | POA: Diagnosis not present

## 2018-10-28 LAB — CUP PACEART REMOTE DEVICE CHECK
Date Time Interrogation Session: 20200517234120
Implantable Pulse Generator Implant Date: 20190318

## 2018-10-29 ENCOUNTER — Ambulatory Visit: Payer: BLUE CROSS/BLUE SHIELD | Admitting: Family Medicine

## 2018-11-05 NOTE — Progress Notes (Signed)
Carelink Summary Report / Loop Recorder 

## 2018-11-29 ENCOUNTER — Ambulatory Visit (INDEPENDENT_AMBULATORY_CARE_PROVIDER_SITE_OTHER): Payer: BLUE CROSS/BLUE SHIELD | Admitting: *Deleted

## 2018-11-29 DIAGNOSIS — I639 Cerebral infarction, unspecified: Secondary | ICD-10-CM

## 2018-11-30 LAB — CUP PACEART REMOTE DEVICE CHECK
Date Time Interrogation Session: 20200620000930
Implantable Pulse Generator Implant Date: 20190318

## 2018-12-04 NOTE — Progress Notes (Signed)
Carelink Summary Report / Loop Recorder 

## 2019-01-01 ENCOUNTER — Ambulatory Visit (INDEPENDENT_AMBULATORY_CARE_PROVIDER_SITE_OTHER): Payer: BLUE CROSS/BLUE SHIELD | Admitting: *Deleted

## 2019-01-01 DIAGNOSIS — I119 Hypertensive heart disease without heart failure: Secondary | ICD-10-CM

## 2019-01-02 LAB — CUP PACEART REMOTE DEVICE CHECK
Date Time Interrogation Session: 20200723003620
Implantable Pulse Generator Implant Date: 20190318

## 2019-01-15 NOTE — Progress Notes (Signed)
Carelink Summary Report / Loop Recorder 

## 2019-02-03 ENCOUNTER — Encounter: Payer: BLUE CROSS/BLUE SHIELD | Admitting: *Deleted

## 2019-03-01 DIAGNOSIS — F411 Generalized anxiety disorder: Secondary | ICD-10-CM | POA: Diagnosis present

## 2019-04-23 ENCOUNTER — Other Ambulatory Visit: Payer: Self-pay | Admitting: Family Medicine

## 2019-04-23 DIAGNOSIS — K219 Gastro-esophageal reflux disease without esophagitis: Secondary | ICD-10-CM

## 2019-06-20 ENCOUNTER — Encounter (HOSPITAL_COMMUNITY): Payer: Self-pay | Admitting: *Deleted

## 2019-06-20 ENCOUNTER — Other Ambulatory Visit: Payer: Self-pay

## 2019-06-20 ENCOUNTER — Emergency Department (HOSPITAL_COMMUNITY)
Admission: EM | Admit: 2019-06-20 | Discharge: 2019-06-21 | Disposition: A | Discharge status: Home / Self Care | Payer: Medicaid Other | Attending: Emergency Medicine | Admitting: Emergency Medicine

## 2019-06-20 DIAGNOSIS — D649 Anemia, unspecified: Secondary | ICD-10-CM | POA: Diagnosis not present

## 2019-06-20 DIAGNOSIS — I129 Hypertensive chronic kidney disease with stage 1 through stage 4 chronic kidney disease, or unspecified chronic kidney disease: Secondary | ICD-10-CM | POA: Diagnosis not present

## 2019-06-20 DIAGNOSIS — Z794 Long term (current) use of insulin: Secondary | ICD-10-CM | POA: Insufficient documentation

## 2019-06-20 DIAGNOSIS — N390 Urinary tract infection, site not specified: Secondary | ICD-10-CM

## 2019-06-20 DIAGNOSIS — N184 Chronic kidney disease, stage 4 (severe): Secondary | ICD-10-CM | POA: Diagnosis not present

## 2019-06-20 DIAGNOSIS — E1140 Type 2 diabetes mellitus with diabetic neuropathy, unspecified: Secondary | ICD-10-CM | POA: Insufficient documentation

## 2019-06-20 DIAGNOSIS — N3091 Cystitis, unspecified with hematuria: Secondary | ICD-10-CM | POA: Diagnosis not present

## 2019-06-20 DIAGNOSIS — N289 Disorder of kidney and ureter, unspecified: Secondary | ICD-10-CM

## 2019-06-20 DIAGNOSIS — Z87442 Personal history of urinary calculi: Secondary | ICD-10-CM | POA: Diagnosis not present

## 2019-06-20 DIAGNOSIS — R109 Unspecified abdominal pain: Secondary | ICD-10-CM | POA: Diagnosis present

## 2019-06-20 DIAGNOSIS — J45909 Unspecified asthma, uncomplicated: Secondary | ICD-10-CM | POA: Diagnosis not present

## 2019-06-20 LAB — URINALYSIS, ROUTINE W REFLEX MICROSCOPIC
Bilirubin Urine: NEGATIVE
Glucose, UA: NEGATIVE mg/dL
Ketones, ur: NEGATIVE mg/dL
Nitrite: NEGATIVE
Protein, ur: 100 mg/dL — AB
RBC / HPF: >50 RBC/hpf — ABNORMAL HIGH (ref 0–5)
Specific Gravity, Urine: 1.011 (ref 1.005–1.030)
WBC, UA: >50 WBC/hpf — ABNORMAL HIGH (ref 0–5)
pH: 7 (ref 5.0–8.0)

## 2019-06-20 LAB — COMPREHENSIVE METABOLIC PANEL
ALT: 13 U/L (ref 0–44)
AST: 14 U/L — ABNORMAL LOW (ref 15–41)
Albumin: 3.8 g/dL (ref 3.5–5.0)
Alkaline Phosphatase: 65 U/L (ref 38–126)
Anion gap: 9 (ref 5–15)
BUN: 19 mg/dL (ref 6–20)
CO2: 21 mmol/L — ABNORMAL LOW (ref 22–32)
Calcium: 9.2 mg/dL (ref 8.9–10.3)
Chloride: 111 mmol/L (ref 98–111)
Creatinine, Ser: 2.2 mg/dL — ABNORMAL HIGH (ref 0.44–1.00)
GFR calc Af Amer: 30 mL/min — ABNORMAL LOW (ref >60)
GFR calc non Af Amer: 26 mL/min — ABNORMAL LOW (ref >60)
Glucose, Bld: 121 mg/dL — ABNORMAL HIGH (ref 70–99)
Potassium: 3.7 mmol/L (ref 3.5–5.1)
Sodium: 141 mmol/L (ref 135–145)
Total Bilirubin: 0.3 mg/dL (ref 0.3–1.2)
Total Protein: 7.1 g/dL (ref 6.5–8.1)

## 2019-06-20 LAB — LIPASE, BLOOD: Lipase: 21 U/L (ref 11–51)

## 2019-06-20 LAB — CBC
HCT: 29.5 % — ABNORMAL LOW (ref 36.0–46.0)
Hemoglobin: 9.6 g/dL — ABNORMAL LOW (ref 12.0–15.0)
MCH: 28.9 pg (ref 26.0–34.0)
MCHC: 32.5 g/dL (ref 30.0–36.0)
MCV: 88.9 fL (ref 80.0–100.0)
Platelets: 310 10*3/uL (ref 150–400)
RBC: 3.32 MIL/uL — ABNORMAL LOW (ref 3.87–5.11)
RDW: 12.7 % (ref 11.5–15.5)
WBC: 8.9 10*3/uL (ref 4.0–10.5)
nRBC: 0 % (ref 0.0–0.2)

## 2019-06-20 MED ORDER — SODIUM CHLORIDE 0.9% FLUSH: 3.0000 mL | Freq: Once | INTRAVENOUS | Status: DC

## 2019-06-20 NOTE — ED Triage Notes (Signed)
Pt arrives with left sided abdominal pain that started today, associated with diarrhea and nausea. She is also reporting odorous urine and that she has had a temperature, but does not know what it was because her daughter took it and did not tell her.

## 2019-06-21 ENCOUNTER — Other Ambulatory Visit: Payer: Self-pay

## 2019-06-21 ENCOUNTER — Emergency Department (HOSPITAL_COMMUNITY): Payer: Medicaid Other

## 2019-06-21 MED ORDER — LEVOFLOXACIN 500 MG PO TABS
500.0000 mg | ORAL_TABLET | Freq: Once | ORAL | Status: AC
  Administered 2019-06-21: 500 mg via ORAL
  Filled 2019-06-21: qty 1

## 2019-06-21 MED ORDER — OXYCODONE-ACETAMINOPHEN 5-325 MG PO TABS: 1.0000 | ORAL_TABLET | ORAL | 0 refills | Status: DC | PRN

## 2019-06-21 MED ORDER — ONDANSETRON HCL 4 MG PO TABS: 4.0000 mg | ORAL_TABLET | Freq: Four times a day (QID) | ORAL | 0 refills | Status: DC | PRN

## 2019-06-21 MED ORDER — LEVOFLOXACIN 500 MG PO TABS: 500.0000 mg | ORAL_TABLET | Freq: Every day | ORAL | 0 refills | Status: DC

## 2019-06-21 MED ORDER — ONDANSETRON 4 MG PO TBDP
8.0000 mg | ORAL_TABLET | Freq: Once | ORAL | Status: AC
  Administered 2019-06-21: 8 mg via ORAL
  Filled 2019-06-21: qty 2

## 2019-06-21 MED ORDER — OXYCODONE-ACETAMINOPHEN 5-325 MG PO TABS
1.0000 | ORAL_TABLET | Freq: Once | ORAL | Status: AC
  Administered 2019-06-21: 06:00:00 1 via ORAL
  Filled 2019-06-21: qty 1

## 2019-06-21 NOTE — Discharge Instructions (Signed)
Drink plenty of fluids.  Take acetaminophen for pain.  Reserve oxycodone-acetaminophen for pain not relieved with acetaminophen alone.  Return if you are running high fever, vomiting, or having any other significant problems.

## 2019-06-21 NOTE — ED Provider Notes (Signed)
[facility] EMERGENCY DEPARTMENT Provider Note   CSN: [id number] Arrival date & time: 06/20/19  2221   History Chief Complaint  Patient presents with  . Abdominal Pain    Christina Orozco is a 49 y.o. female.  The history is provided by the patient.  Abdominal Pain She has history of diabetes, hypertension, chronic kidney disease comes in because of abdominal pain and flank pain which started yesterday.  She is complaining of pain across her lower and middle abdomen and into the left flank.  She rates pain 10/10.  Nothing makes it better, nothing makes it worse.  There is associated nausea and dry heaves but no vomiting.  She has not noticed any change in her urinary urgency and frequency but has not noticed some tenesmus.  She denies any dysuria.  She states that she has had fevers up to 101 but no chills or sweats.  She states she has a history of kidney stones and is concerned that she is having a recurrence of that.  She has not taken anything for pain at home.  Past Medical History:  Diagnosis Date  . Anemia    severe  . Asthma   . CKD (chronic kidney disease)    stage IV  . DDD (degenerative disc disease), cervical   . Diabetes mellitus ([place])   . Dislocated shoulder    right  . GERD (gastroesophageal reflux disease)   . Hypertension   . Neuropathy   . Stroke [facility])     Patient Active Problem List   Diagnosis Date Noted  . Abnormality of gait 10/14/2018  . Dizziness and giddiness 09/13/2018  . Debility 06/26/2018  . Chronic nonintractable headache 04/26/2018  . Diastolic dysfunction   . Congestion of nasal sinus   . Pneumonia of left lower lobe due to infectious organism   . SOB (shortness of breath)   . Candidiasis   . Stage 3 chronic kidney disease   . Acute on chronic anemia   . Hypoalbuminemia due to protein-calorie malnutrition ([place])   . Essential hypertension   . Poorly controlled type 2 diabetes mellitus with peripheral neuropathy ([place])    . Morbidly obese ([place])   . Seizure ([place]) 04/02/2018  . PRES (posterior reversible encephalopathy syndrome)   . Diabetes mellitus type 2 in obese ([place])   . Benign essential HTN   . Hypokalemia   . History of CVA with residual deficit   . Seizures ([place]) 03/25/2018  . Bilateral carpal tunnel syndrome 02/14/2018  . Arthritis 10/31/2017  . Asthma 10/31/2017  . Constipation 10/31/2017  . Kidney stone 10/31/2017  . Diabetes ([place]) 09/28/2017  . Anemia of chronic disease 08/09/2017  . Hyperlipidemia 07/26/2017  . 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[place]) 07/25/2017  . Anxiety as acute reaction to exceptional stress 04/23/2017  . Insomnia 10/08/2016  . Neuropathy 06/07/2016  . Gait instability   . Lesion of pons   . Slurred speech   . Dizziness 04/30/2016  . Hyperglycemia 04/30/2016  . Chronic kidney disease (CKD), stage III (moderate) 04/30/2016  . Ataxia 04/30/2016  . Chronic right shoulder pain 04/03/2016  . 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 Rhinitis, allergic 08/25/2015  . Asthma, mild intermittent 07/14/2015  . Gastroesophageal reflux disease without esophagitis 06/08/2015  . Hypertensive cardiovascular disease 05/05/2015  . Vitamin D deficiency 05/05/2015    Past Surgical History:  Procedure Laterality Date  . ADENOIDECTOMY    . APPENDECTOMY    . CERVICAL SPINE SURGERY  06/2012   C5-C6  .  EYE SURGERY     left eye surgery   . FOOT SURGERY    . LOOP RECORDER INSERTION N/A 08/27/2017   Procedure: LOOP RECORDER INSERTION;  Surgeon: Sanda Klein, MD;  Location: [place] [facility];  Service: Cardiovascular;  Laterality: N/A;  . SHOULDER SURGERY    . TEE WITHOUT CARDIOVERSION N/A 08/27/2017   Procedure: TRANSESOPHAGEAL ECHOCARDIOGRAM (TEE);  Surgeon: Sanda Klein, MD;  Location: [facility] ENDOSCOPY;  Service: Cardiovascular;  Laterality: N/A;  . TONSILLECTOMY       OB History   No obstetric history on file.     Family History  Problem Relation Age of Onset  . Vascular  Disease Mother   . CAD Mother   . Heart failure Mother   . Heart disease Other   . Cancer Other        colon, stomach, pancreatic, lung  . Diabetes Other   . Breast cancer Sister 19  . Seizures Maternal Uncle     Social History   Tobacco Use  . Smoking status: Never Smoker  . Smokeless tobacco: Never Used  Substance Use Topics  . Alcohol use: No  . Drug use: No    Home Medications Prior to Admission medications   Medication Sig Start Date End Date Taking? Authorizing Provider  albuterol (PROAIR HFA) 108 (90 Base) MCG/ACT inhaler INHALE 1 OR 2 PUFFS INTO THE LUNGS EVERY 6 HOURS AS NEEDED FOR WHEEZING OR SHORTNESS OF BREATH 04/12/18   Angiulli, Lavon Paganini, PA-C  amLODipine (NORVASC) 10 MG tablet Take 1 tablet (10 mg total) by mouth daily. 04/12/18 07/15/18  Angiulli, Lavon Paganini, PA-C  atorvastatin (LIPITOR) 40 MG tablet TAKE 1 TABLET BY MOUTH EVERY EVENING AT 6 PM 10/22/18   Lanae Boast, FNP  carvedilol (COREG) 25 MG tablet Take 1 tablet (25 mg total) by mouth 2 (two) times daily with a meal. Take 25 mg twice daily 04/12/18   Angiulli, Lavon Paganini, PA-C  clopidogrel (PLAVIX) 75 MG tablet Take 1 tablet (75 mg total) by mouth daily. 04/12/18   Angiulli, Lavon Paganini, PA-C  doxazosin (CARDURA) 4 MG tablet Take 1 tablet (4 mg total) by mouth daily. 04/12/18   Angiulli, Lavon Paganini, PA-C  ferrous sulfate 325 (65 FE) MG tablet Take 1 tablet (325 mg total) by mouth daily. 04/12/18 04/12/19  Angiulli, Lavon Paganini, PA-C  furosemide (LASIX) 40 MG tablet Take 1 tablet (40 mg total) by mouth daily. 06/13/18   Lanae Boast, FNP  glucose blood (CONTOUR NEXT TEST) test strip 1 each by Other route 2 (two) times daily. And lancets 2/day 04/12/18   Angiulli, Lavon Paganini, PA-C  hydrALAZINE (APRESOLINE) 50 MG tablet Take 1 tablet (50 mg total) by mouth 4 (four) times daily. 07/02/18 12/07/18  Erlene Quan, PA-C  hydrOXYzine (ATARAX/VISTARIL) 10 MG tablet Take 1 tablet (10 mg total) by mouth every 8 (eight) hours as needed for  anxiety. 07/15/18   Lanae Boast, FNP  insulin glargine (LANTUS) 100 unit/mL SOPN Inject 0.2 mLs (20 Units total) into the skin at bedtime. 04/12/18   Angiulli, Lavon Paganini, PA-C  levETIRAcetam (KEPPRA) 500 MG tablet TAKE 1 TABLET(500 MG) BY MOUTH TWICE DAILY 07/15/18   Lanae Boast, FNP  meclizine (ANTIVERT) 25 MG tablet TAKE 1 TABLET(25 MG) BY MOUTH THREE TIMES DAILY AS NEEDED FOR DIZZINESS 06/10/18   Lanae Boast, FNP  pantoprazole (PROTONIX) 40 MG tablet TAKE 1 TABLET(40 MG) BY MOUTH DAILY 04/23/19   Tresa Garter, MD  sertraline (ZOLOFT) 100 MG tablet Take  100 mg by mouth daily.    [provider]    Allergies    Lisinopril, Penicillins, and Gabapentin  Review of Systems   Review of Systems  Gastrointestinal: Positive for abdominal pain.  All other systems reviewed and are negative.   Physical Exam Updated Vital Signs BP (!) 141/89 (BP Location: Right Arm)   Pulse 89   Temp 98.6 F (37 C) (Oral)   Resp 18   SpO2 100%   Physical Exam Vitals and nursing note reviewed.   49 year old female, resting comfortably and in no acute distress. Vital signs are significant for mildly elevated blood pressure. Oxygen saturation is 100%, which is normal. Head is normocephalic and atraumatic. PERRLA, EOMI. Oropharynx is clear. Neck is nontender and supple without adenopathy or JVD. Back is nontender in the midline.  There is moderate left CVA tenderness. Lungs are clear without rales, wheezes, or rhonchi. Chest is nontender. Heart has regular rate and rhythm without murmur. Abdomen is soft, flat, with mild tenderness diffusely.  Maximum tenderness is in the left lower quadrant and left midabdomen.  There is no rebound or guarding.  There are no masses or hepatosplenomegaly and peristalsis is hypoactive. Extremities have no cyanosis or edema, full range of motion is present. Skin is warm and dry without rash. Neurologic: Mental status is normal, cranial nerves are intact,  there are no motor or sensory deficits.  ED Results / Procedures / Treatments   Labs (all labs ordered are listed, but only abnormal results are displayed) Labs Reviewed  COMPREHENSIVE METABOLIC PANEL - Abnormal; Notable for the following components:      Result Value   CO2 21 (*)    Glucose, Bld 121 (*)    Creatinine, Ser 2.20 (*)    AST 14 (*)    GFR calc non Af Amer 26 (*)    GFR calc Af Amer 30 (*)    All other components within normal limits  CBC - Abnormal; Notable for the following components:   RBC 3.32 (*)    Hemoglobin 9.6 (*)    HCT 29.5 (*)    All other components within normal limits  URINALYSIS, ROUTINE W REFLEX MICROSCOPIC - Abnormal; Notable for the following components:   APPearance CLOUDY (*)    Hgb urine dipstick MODERATE (*)    Protein, ur 100 (*)    Leukocytes,Ua LARGE (*)    RBC / HPF >50 (*)    WBC, UA >50 (*)    Bacteria, UA FEW (*)    All other components within normal limits  LIPASE, BLOOD   Radiology No results found.  Procedures Procedures  Medications Ordered in ED Medications  sodium chloride flush (NS) 0.9 % injection 3 mL (has no administration in time range)    ED Course  I have reviewed the triage vital signs and the nursing notes.  Pertinent labs & imaging results that were available during my care of the patient were reviewed by me and considered in my medical decision making (see chart for details).  MDM Rules/Calculators/A&P Abdominal pain and flank tenderness concerning for either UTI or urolithiasis.  Old records reviewed confirming history of kidney stones.  Also, consider diverticulitis, abdominal aneurysm.  Urinalysis as greater than 50 RBCs, greater than 50 WBCs with WBC clumps present, nitrite negative.  Urine is sent for culture and she is sent for renal stone protocol CT scan.  She is given oxycodone-acetaminophen for pain and ondansetron for nausea.  She is also given  initial dose of levofloxacin.  CT shows no evidence  of urolithiasis.  Patient is advised of these findings.  She feels considerably better following above-noted treatment.  She is discharged with prescription for levofloxacin and a small number of oxycodone-acetaminophen tablets.  Prior to prescription being written, I did consult the [place] controlled substance reporting website, and it has been more than a year since her last narcotic prescription.  She is advised to follow-up with PCP following completion of antibiotics.  Return precautions discussed.  Final Clinical Impression(s) / ED Diagnoses Final diagnoses:  Urinary tract infection with hematuria, site unspecified  Renal insufficiency  Normochromic normocytic anemia    Rx / DC Orders ED Discharge Orders         Ordered    levofloxacin (LEVAQUIN) 500 MG tablet  Daily     06/21/19 0741    oxyCODONE-acetaminophen (PERCOCET) 5-325 MG tablet  Every 4 hours PRN     06/21/19 0742    ondansetron (ZOFRAN) 4 MG tablet  Every 6 hours PRN     06/21/19 [id number]           Delora Fuel, MD [id number]

## 2019-06-23 LAB — URINE CULTURE: Culture: >=100000 — AB

## 2019-06-24 ENCOUNTER — Telehealth: Payer: Self-pay | Admitting: Emergency Medicine

## 2019-06-24 NOTE — Telephone Encounter (Signed)
Post ED Visit - Positive Culture Follow-up  Culture report reviewed by antimicrobial stewardship pharmacist: [place] Team []  Elenor Quinones, Pharm.D. []  Heide Guile, Pharm.D., BCPS AQ-ID []  Parks Neptune, Pharm.D., BCPS []  Alycia Rossetti, Pharm.D., BCPS []  [place], [place].D., BCPS, AAHIVP []  Legrand Como, Pharm.D., BCPS, AAHIVP []  Salome Arnt, PharmD, BCPS []  Johnnette Gourd, PharmD, BCPS []  Hughes Better, PharmD, BCPS []  Leeroy Cha, PharmD []  Laqueta Linden, PharmD, BCPS []  Albertina Parr, PharmD  [place] Team []  Leodis Sias, PharmD []  Lindell Spar, PharmD []  Royetta Asal, PharmD []  Graylin Shiver, Rph []  Rema Fendt) Glennon Mac, PharmD []  Arlyn Dunning, PharmD []  Netta Cedars, PharmD []  Dia Sitter, PharmD []  Leone Haven, PharmD []  Gretta Arab, PharmD []  Theodis Shove, PharmD []  Peggyann Juba, PharmD []  Reuel Boom, PharmD   Positive urine culture Treated with levofloxacin, organism sensitive to the same and no further patient follow-up is required at this time.  Hazle Nordmann 06/24/2019, 4:37 PM

## 2019-07-22 ENCOUNTER — Encounter: Payer: Self-pay | Admitting: Cardiology

## 2019-07-22 ENCOUNTER — Telehealth: Payer: Self-pay

## 2019-07-22 ENCOUNTER — Telehealth (INDEPENDENT_AMBULATORY_CARE_PROVIDER_SITE_OTHER): Payer: Medicaid Other | Admitting: Cardiology

## 2019-07-22 DIAGNOSIS — N1831 Chronic kidney disease, stage 3a: Secondary | ICD-10-CM

## 2019-07-22 DIAGNOSIS — I119 Hypertensive heart disease without heart failure: Secondary | ICD-10-CM

## 2019-07-22 DIAGNOSIS — I6930 Unspecified sequelae of cerebral infarction: Secondary | ICD-10-CM

## 2019-07-22 DIAGNOSIS — R42 Dizziness and giddiness: Secondary | ICD-10-CM

## 2019-07-22 NOTE — Patient Instructions (Signed)
Medication Instructions:  Your physician recommends that you continue on your current medications as directed. Please refer to the Current Medication list given to you today. *If you need a refill on your cardiac medications before your next appointment, please call your pharmacy*  Lab Work: None  If you have labs (blood work) drawn today and your tests are completely normal, you will receive your results only by: Marland Kitchen MyChart Message (if you have MyChart) OR . A paper copy in the mail If you have any lab test that is abnormal or we need to change your treatment, we will call you to review the results.  Testing/Procedures: None   Follow-Up: At [facility], you and your health needs are our priority.  As part of our continuing mission to provide you with exceptional heart care, we have created designated Provider Care Teams.  These Care Teams include your primary Cardiologist (physician) and Advanced Practice Providers (APPs -  Physician Assistants and Nurse Practitioners) who all work together to provide you with the care you need, when you need it.  Your next appointment:   12 month(s)  The format for your next appointment:   In Person  Provider:   Skeet Latch, MD  Other Instructions

## 2019-07-22 NOTE — Telephone Encounter (Signed)

## 2019-07-22 NOTE — Progress Notes (Signed)
Virtual Visit via Telephone Note   This visit type was conducted due to national recommendations for restrictions regarding the COVID-19 Pandemic (e.g. social distancing) in an effort to limit this patient's exposure and mitigate transmission in our community.  Due to her co-morbid illnesses, this patient is at least at moderate risk for complications without adequate follow up.  This format is felt to be most appropriate for this patient at this time.  The patient did not have access to video technology/had technical difficulties with video requiring transitioning to audio format only (telephone).  All issues noted in this document were discussed and addressed.  No physical exam could be performed with this format.  Please refer to the patient's chart for her  consent to telehealth for [facility].   Date:  07/22/2019   ID:  Christina Orozco, DOB [birth year], MRN [id number]  Patient Location: Home Provider Location: Home  PCP:  Patient, No Pcp Per  Cardiologist:  Skeet Latch, MD  Electrophysiologist:  None   Evaluation Performed:  Follow-Up Visit  Chief Complaint:  Routine follow up  History of Present Illness:    Christina Orozco is a pleasant 49 y.o.AA female with a complicated medical history.  She has a history of HTN, CRI-3, HCVD with severe LVH by echo in Feb 2019, IDDM, and recurrent CVA. She was admitted to [facility] with a small pontine CVA in Feb 2019.  She was placed on full dose ASA and Plavix. Echo was unchanged from previous studies showing her EF to be 65-70% with severe LVH, grade 1 DD, and mild MR. Carotid dopplers showed 1-39% narrowing bilaterally. She was seen in follow up and set up for a TEE and loop recorder implant.  TEE 08/27/2017 showed no obvious source of emboli or clot.  Her Loop recorder has apparently not shown any arrhyhtmia.    She was then admitted in Oct 2019 after she was found down in post ictal state at the [facility].  While in CT she had a  seizure and she was intubated.  Her Glucose on admission was > 500 and her B/P 220/ 95.  It's presumed she had been non compliant with her medications as she has had problems affording medications in the past.  She was extubated 03/29/18 but had evidence of encephalopathy and she was discharged to in patient rehab.  We saw her last in Jan 2020 and she was stable.  She was admitted to [facility] in Sept "after several strokes".  She has problems with her memory and had transient visual loss.  She is now on Medicaid and has applied for disability. She had already seen a cardiologist in [place]- in July 2020 and asked that her Loop recorder be removed.  She also now has a PCP,  endocrinologist, and neurologist at [facility] and a nephrologist (Dr Olivia Mackie) in [place].   She was contacted today for follow up and has again requested her Loop recorder be removed.  She tells me her B/P has been controlled- 120/80 range- but didn't have an specific readings.  She requests that Dr Oval Linsey be her cardiologist of record.    The patient does not have symptoms concerning for COVID-19 infection (fever, chills, cough, or new shortness of breath).    Past Medical History:  Diagnosis Date  . Anemia    severe  . Asthma   . CKD (chronic kidney disease)    stage IV  . DDD (degenerative disc disease), cervical   .  Diabetes mellitus ([place])   . Dislocated shoulder    right  . GERD (gastroesophageal reflux disease)   . Hypertension   . Neuropathy   . Stroke [facility])    Past Surgical History:  Procedure Laterality Date  . ADENOIDECTOMY    . APPENDECTOMY    . CERVICAL SPINE SURGERY  06/2012   C5-C6  . EYE SURGERY     left eye surgery   . FOOT SURGERY    . LOOP RECORDER INSERTION N/A 08/27/2017   Procedure: LOOP RECORDER INSERTION;  Surgeon: Sanda Klein, MD;  Location: [place] CV LAB;  Service: Cardiovascular;  Laterality: N/A;  . SHOULDER SURGERY    . TEE WITHOUT CARDIOVERSION N/A  08/27/2017   Procedure: TRANSESOPHAGEAL ECHOCARDIOGRAM (TEE);  Surgeon: Sanda Klein, MD;  Location: [facility] ENDOSCOPY;  Service: Cardiovascular;  Laterality: N/A;  . TONSILLECTOMY       Current Meds  Medication Sig  . albuterol (PROAIR HFA) 108 (90 Base) MCG/ACT inhaler INHALE 1 OR 2 PUFFS INTO THE LUNGS EVERY 6 HOURS AS NEEDED FOR WHEEZING OR SHORTNESS OF BREATH  . amLODipine (NORVASC) 10 MG tablet Take 1 tablet (10 mg total) by mouth daily.  Marland Kitchen atorvastatin (LIPITOR) 40 MG tablet TAKE 1 TABLET BY MOUTH EVERY EVENING AT 6 PM  . carvedilol (COREG) 25 MG tablet Take 1 tablet (25 mg total) by mouth 2 (two) times daily with a meal. Take 25 mg twice daily  . Cholecalciferol (VITAMIN D3 PO) Take 2,000 mg by mouth daily.  . clopidogrel (PLAVIX) 75 MG tablet Take 1 tablet (75 mg total) by mouth daily.  . cyclobenzaprine (FLEXERIL) 5 MG tablet Take 5 mg by mouth as needed for muscle spasms.  Marland Kitchen doxazosin (CARDURA) 4 MG tablet Take 1 tablet (4 mg total) by mouth daily.  . DULoxetine (CYMBALTA) 60 MG capsule Take 60 mg by mouth daily.  . Eszopiclone 3 MG TABS Take 3 mg by mouth at bedtime. Take immediately before bedtime  . ferrous sulfate 325 (65 FE) MG tablet Take 1 tablet (325 mg total) by mouth daily.  . furosemide (LASIX) 40 MG tablet Take 1 tablet (40 mg total) by mouth daily.  Marland Kitchen glucose blood (CONTOUR NEXT TEST) test strip 1 each by Other route 2 (two) times daily. And lancets 2/day (Patient taking differently: 1 each by Other route daily. And lancets 2/day)  . hydrALAZINE (APRESOLINE) 50 MG tablet Take 1 tablet (50 mg total) by mouth 4 (four) times daily.  . insulin glargine (LANTUS) 100 unit/mL SOPN Inject 0.2 mLs (20 Units total) into the skin at bedtime.  . levETIRAcetam (KEPPRA) 500 MG tablet TAKE 1 TABLET(500 MG) BY MOUTH TWICE DAILY  . meclizine (ANTIVERT) 25 MG tablet TAKE 1 TABLET(25 MG) BY MOUTH THREE TIMES DAILY AS NEEDED FOR DIZZINESS  . nortriptyline (PAMELOR) 10 MG capsule Take 10 mg  by mouth at bedtime.  . pantoprazole (PROTONIX) 40 MG tablet TAKE 1 TABLET(40 MG) BY MOUTH DAILY  . zonisamide (ZONEGRAN) 100 MG capsule Take 100 mg by mouth at bedtime.     Allergies:   Lisinopril, Penicillins, and Gabapentin   Social History   Tobacco Use  . Smoking status: Never Smoker  . Smokeless tobacco: Never Used  Substance Use Topics  . Alcohol use: No  . Drug use: No     Family Hx: The patient's family history includes Breast cancer (age of onset: 79) in her sister; CAD in her mother; Cancer in an other family member; Diabetes in an other  family member; Heart disease in an other family member; Heart failure in her mother; Seizures in her maternal uncle; Vascular Disease in her mother.  ROS:   Please see the history of present illness.    No tachycardia or palpitations All other systems reviewed and are negative.   Prior CV studies:   The following studies were reviewed today: TEE 09-13-17  Labs/Other Tests and Data Reviewed:    EKG:  An ECG dated 03/27/2018 was personally reviewed today and demonstrated:  NSR, PVCs, septal Qs, QTc 519  Recent Labs: 06/20/2019: ALT 13; BUN 19; Creatinine, Ser 2.20; Hemoglobin 9.6; Platelets 310; Potassium 3.7; Sodium 141   Recent Lipid Panel Lab Results  Component Value Date/Time   CHOL 99 07/26/2017 11:18 AM   TRIG 138 03/27/2018 04:14 AM   HDL 42 07/26/2017 11:18 AM   CHOLHDL 2.4 07/26/2017 11:18 AM   LDLCALC 43 07/26/2017 11:18 AM    Wt Readings from Last 3 Encounters:  09/13/18 191 lb (86.6 kg)  08/02/18 191 lb (86.6 kg)  07/15/18 195 lb (88.5 kg)     Objective:    Vital Signs:  LMP 05/22/2018 (Within Days)    VITAL SIGNS:  reviewed  ASSESSMENT & PLAN:    Recurrent CVA-? ? Etiology- ? Hypertensive- I will need to review her records from her [place] admission.   Hypertensive cardiovascular disease Normal LVF with severe LVH and grade 1 DD on echo Feb 2019  Seizure secondary to hypertensive crisis and  resultant encephalopathy Oct 2019 Secondary to medication non compliance (finances) prior to that admission.  Recent neurology notes suggests her "stroke" in Sept 2020 may actually have been a seizure.   CVA (cerebral vascular accident) ([place]) H/O CVA 2017 with recurrent pontine CVA Feb 2019 and recent CVA Sept 2020- followed at [facility]- Dr Doy Hutching  Chronic kidney disease (CKD), stage III (moderate) Followed by Dr Olivia Mackie in [facility]  Diabetes mellitus type 2, with complication, on long term insulin pump (HCC) Type 2 IDDM with CRI-3- Followed by endo at [facility].  Plan- I'll review her records from her [place] admission and ask our EP department to review her Loop Recorder data.  The patient has also asked that this be removed if its not providing any useful information.     COVID-19 Education: The signs and symptoms of COVID-19 were discussed with the patient and how to seek care for testing (follow up with PCP or arrange E-visit).  The importance of social distancing was discussed today.  Time:   Today, I have spent 20 minutes with the patient with telehealth technology discussing the above problems.     Medication Adjustments/Labs and Tests Ordered: Current medicines are reviewed at length with the patient today.  Concerns regarding medicines are outlined above.   Tests Ordered: No orders of the defined types were placed in this encounter.   Medication Changes: No orders of the defined types were placed in this encounter.   Follow Up:   Dr Oval Linsey one year  Signed, Kerin Ransom, PA-C  07/22/2019 2:04 PM    [place]

## 2019-07-22 NOTE — Telephone Encounter (Signed)
Contacted patient to discuss AVS Instructions. Gave patient Christina Orozco's recommendations from today's virtual office visit. Informed patient that someone from the scheduling dept will be in contact with them to schedule their follow up appt. Patient voiced understanding; AVS printed and mailed to patient.    

## 2019-07-29 ENCOUNTER — Telehealth: Payer: Self-pay

## 2019-07-29 NOTE — Telephone Encounter (Signed)
-----   Message from Mechele Dawley, RN sent at 07/22/2019  2:54 PM EST ----- Regarding: RE: advice-pacer Louanne Belton,  She has a loop recorder and it looks like her last transmission was in 12/2018. We can check back later this week to see if she has plugged in her monitor. I Cc'd your message to the [facility] pool as I am not back in the office until Thursday.  Thanks, Raquel Sarna ----- Message ----- From: Harold Hedge, CMA Sent: 07/22/2019   2:22 PM EST To: Mechele Dawley, RN Subject: advice-pacer                                   Christina Orozco,   This patient had a virtual visit with Lurena Joiner today and she wants to have her pacer removed. Lurena Joiner would like for you to check for arrhthymias. When I spoke with he patient she stated she did not have something plugged into the wall and stated she was going to plug it in. Lurena Joiner is going to send his note to Dr Oval Linsey and Dr C to see what they suggest however he just wanted to know how the pacer was reading.    Thanks in advance,  [place]

## 2019-07-29 NOTE — Telephone Encounter (Signed)
Spoke with pt, she ahd not yet plugged in bedside monitor.  During call, pt plugged in monitor but the handheld device needs to cahrge.  She will give it over night and try to send manual transmission in AM.

## 2019-07-30 ENCOUNTER — Other Ambulatory Visit: Payer: Self-pay | Admitting: Cardiology

## 2019-07-30 NOTE — Telephone Encounter (Signed)
I agree that it is highly unlikely that we will see anything on the recorder, with no events in 2 years. We can explant in the office on Monday afternoons. We'll get in touch with her to find a date (please, Lattie Haw?)

## 2019-07-30 NOTE — Telephone Encounter (Signed)
Manual LINQ transmission received. No episodes since implant in 08/2017.   Spoke with patient. She confirms that she is still interested in having LINQ removed. Explained I will forward this message to Dr. Sallyanne Kuster as an Juluis Rainier and to Lattie Haw, RN, for assistance scheduling this appointment. Pt verbalizes understanding and is aware it may be a few days before she receives a call back. No additional questions at this time.

## 2019-07-31 NOTE — Telephone Encounter (Signed)
Call placed to the patient. She has been scheduled for a Loop Explant on 2/22 at 1:30 with Dr. Sallyanne Kuster. Message has been sent to precert.   The patient has verbalized all instructions.

## 2019-08-04 ENCOUNTER — Other Ambulatory Visit: Payer: Self-pay

## 2019-08-04 ENCOUNTER — Ambulatory Visit (INDEPENDENT_AMBULATORY_CARE_PROVIDER_SITE_OTHER): Payer: Medicaid Other | Admitting: Cardiovascular Disease

## 2019-08-04 DIAGNOSIS — I6930 Unspecified sequelae of cerebral infarction: Secondary | ICD-10-CM

## 2019-08-04 MED ORDER — LIDOCAINE-EPINEPHRINE 1 %-1:100000 IJ SOLN: 10.0000 mL | Freq: Once | INTRAMUSCULAR | Status: DC

## 2019-08-04 NOTE — Patient Instructions (Signed)
Discharge Instructions for  Loop Recorder Implant    Follow up: You will need a wound check follow up in 10-14 days. We will call you to set this up.. It may be virtual if you have access to video.   If you have any questions or concerns, please call the office at [phone].  ACTIVITY No restrictions. DO wear your seatbelt, even if it crosses over the site.   WOUND CARE  Keep the wound area clean and dry.  Remove the dressing the day after (usually 24 hours after the procedure).  DO NOT SUBMERGE UNDER WATER UNTIL FULLY HEALED (no tub baths, hot tubs, swimming pools, etc.).   You  may shower or take a sponge bath after the dressing is removed. DO NOT SOAK the area and do not allow the shower to directly spray on the site.  If you have tape/steri-strips on your wound, these will fall off; do not pull them off prematurely.    No bandage is needed on the site.  DO  NOT apply any creams, oils, or ointments to the wound area.  If you notice any drainage or discharge from the wound, any swelling, excessive redness or bruising at the site, or if you develop a fever > 101? F, call the office at once at [phone].   SPECIAL INSTRUCTIONS  You are still able to use cellular telephones.  Avoid carrying your cellular phone near your device.  When traveling through airports, show security personnel your identification card to avoid being screened in the metal detectors.   Avoid arc welding equipment, MRI testing (magnetic resonance imaging), TENS units (transcutaneous nerve stimulators).  Call the office for questions about other devices.  Avoid electrical appliances that are in poor condition or are not properly grounded.  Microwave ovens are safe to be near or to operate.

## 2019-08-04 NOTE — Progress Notes (Signed)
Procedure Note  Loop recorder explantation Re: devic no longer necessary During 2 years of monitoring, no evidence of atrial fibrillation or other arrhythmia, despite the fact that the patient has had recurrent ischemic strokes in the interim.  This procedure was fully reviewed with the patient and written informed consent was obtained. "Time-out" was performed, confirming the correct procedure and patient.  Procedure performed by:  Sanda Klein, MD  Complications:  None  Estimated blood loss:  <5 mL  Medications administered during procedure:  Lidocaine 1% with 1/10,000 epinephrine 10 mL locally Device details:  Medtronic Reveal Linq model number [id number]  The patient was prepped and draped in usual sterile fashion. Local anesthesia was administered to an area 2 cm to the left of the sternum in the 4th intercostal space. A 1 cm cutaneous incision was made using a scalpel. Blunt dissection with a hemostat easily exposed the device, which was extracted without difficulty. The incision was closed with SteriStrips and a sterile dressing was applied.   Sanda Klein, MD, [facility] [phone] office [phone] pager 08/04/2019 1:58 PM

## 2019-08-14 ENCOUNTER — Telehealth (INDEPENDENT_AMBULATORY_CARE_PROVIDER_SITE_OTHER): Payer: Medicaid Other | Admitting: Student

## 2019-08-14 DIAGNOSIS — I6930 Unspecified sequelae of cerebral infarction: Secondary | ICD-10-CM

## 2019-08-14 NOTE — Progress Notes (Signed)
Virtual wound check s/p loop removal.   Steri-strips previously fell off. Wound well healed without drainage or bleeding.  Pt has no further questions.   Follow up prn or as recommended by Dr. Sallyanne Kuster.   See attached picture below.

## 2019-09-04 ENCOUNTER — Other Ambulatory Visit: Payer: Self-pay | Admitting: Cardiovascular Disease

## 2019-09-05 ENCOUNTER — Other Ambulatory Visit: Payer: Self-pay | Admitting: Cardiovascular Disease

## 2019-09-17 ENCOUNTER — Emergency Department (HOSPITAL_COMMUNITY): Payer: Medicaid Other

## 2019-09-17 ENCOUNTER — Emergency Department (HOSPITAL_COMMUNITY)
Admission: EM | Admit: 2019-09-17 | Discharge: 2019-09-17 | Disposition: A | Discharge status: Home / Self Care | Payer: Medicaid Other | Attending: Emergency Medicine | Admitting: Emergency Medicine

## 2019-09-17 DIAGNOSIS — W1830XA Fall on same level, unspecified, initial encounter: Secondary | ICD-10-CM | POA: Diagnosis not present

## 2019-09-17 DIAGNOSIS — M25562 Pain in left knee: Secondary | ICD-10-CM | POA: Diagnosis not present

## 2019-09-17 DIAGNOSIS — I129 Hypertensive chronic kidney disease with stage 1 through stage 4 chronic kidney disease, or unspecified chronic kidney disease: Secondary | ICD-10-CM | POA: Diagnosis not present

## 2019-09-17 DIAGNOSIS — Y92019 Unspecified place in single-family (private) house as the place of occurrence of the external cause: Secondary | ICD-10-CM | POA: Insufficient documentation

## 2019-09-17 DIAGNOSIS — S0121XA Laceration without foreign body of nose, initial encounter: Secondary | ICD-10-CM | POA: Insufficient documentation

## 2019-09-17 DIAGNOSIS — Y999 Unspecified external cause status: Secondary | ICD-10-CM | POA: Diagnosis not present

## 2019-09-17 DIAGNOSIS — R410 Disorientation, unspecified: Secondary | ICD-10-CM | POA: Insufficient documentation

## 2019-09-17 DIAGNOSIS — Y939 Activity, unspecified: Secondary | ICD-10-CM | POA: Diagnosis not present

## 2019-09-17 DIAGNOSIS — R519 Headache, unspecified: Secondary | ICD-10-CM | POA: Insufficient documentation

## 2019-09-17 DIAGNOSIS — Z79899 Other long term (current) drug therapy: Secondary | ICD-10-CM | POA: Insufficient documentation

## 2019-09-17 DIAGNOSIS — T07XXXA Unspecified multiple injuries, initial encounter: Secondary | ICD-10-CM | POA: Diagnosis present

## 2019-09-17 DIAGNOSIS — W19XXXA Unspecified fall, initial encounter: Secondary | ICD-10-CM

## 2019-09-17 DIAGNOSIS — Z7901 Long term (current) use of anticoagulants: Secondary | ICD-10-CM | POA: Diagnosis not present

## 2019-09-17 DIAGNOSIS — E669 Obesity, unspecified: Secondary | ICD-10-CM | POA: Diagnosis not present

## 2019-09-17 DIAGNOSIS — N183 Chronic kidney disease, stage 3 unspecified: Secondary | ICD-10-CM | POA: Diagnosis not present

## 2019-09-17 DIAGNOSIS — T1490XA Injury, unspecified, initial encounter: Secondary | ICD-10-CM

## 2019-09-17 DIAGNOSIS — Z6834 Body mass index (BMI) 34.0-34.9, adult: Secondary | ICD-10-CM | POA: Diagnosis not present

## 2019-09-17 LAB — CBC
HCT: 28.8 % — ABNORMAL LOW (ref 36.0–46.0)
Hemoglobin: 9.1 g/dL — ABNORMAL LOW (ref 12.0–15.0)
MCH: 28.3 pg (ref 26.0–34.0)
MCHC: 31.6 g/dL (ref 30.0–36.0)
MCV: 89.7 fL (ref 80.0–100.0)
Platelets: 220 10*3/uL (ref 150–400)
RBC: 3.21 MIL/uL — ABNORMAL LOW (ref 3.87–5.11)
RDW: 13.6 % (ref 11.5–15.5)
WBC: 7.2 10*3/uL (ref 4.0–10.5)
nRBC: 0 % (ref 0.0–0.2)

## 2019-09-17 LAB — COMPREHENSIVE METABOLIC PANEL WITH GFR
ALT: 16 U/L (ref 0–44)
AST: 13 U/L — ABNORMAL LOW (ref 15–41)
Albumin: 3.7 g/dL (ref 3.5–5.0)
Alkaline Phosphatase: 63 U/L (ref 38–126)
Anion gap: 10 (ref 5–15)
BUN: 39 mg/dL — ABNORMAL HIGH (ref 6–20)
CO2: 21 mmol/L — ABNORMAL LOW (ref 22–32)
Calcium: 9.3 mg/dL (ref 8.9–10.3)
Chloride: 110 mmol/L (ref 98–111)
Creatinine, Ser: 2.6 mg/dL — ABNORMAL HIGH (ref 0.44–1.00)
GFR calc Af Amer: 25 mL/min — ABNORMAL LOW
GFR calc non Af Amer: 21 mL/min — ABNORMAL LOW
Glucose, Bld: 128 mg/dL — ABNORMAL HIGH (ref 70–99)
Potassium: 3.9 mmol/L (ref 3.5–5.1)
Sodium: 141 mmol/L (ref 135–145)
Total Bilirubin: 0.2 mg/dL — ABNORMAL LOW (ref 0.3–1.2)
Total Protein: 7.2 g/dL (ref 6.5–8.1)

## 2019-09-17 LAB — I-STAT CHEM 8, ED
BUN: 39 mg/dL — ABNORMAL HIGH (ref 6–20)
Calcium, Ion: 1.24 mmol/L (ref 1.15–1.40)
Chloride: 108 mmol/L (ref 98–111)
Creatinine, Ser: 2.9 mg/dL — ABNORMAL HIGH (ref 0.44–1.00)
Glucose, Bld: 122 mg/dL — ABNORMAL HIGH (ref 70–99)
HCT: 26 % — ABNORMAL LOW (ref 36.0–46.0)
Hemoglobin: 8.8 g/dL — ABNORMAL LOW (ref 12.0–15.0)
Potassium: 3.7 mmol/L (ref 3.5–5.1)
Sodium: 142 mmol/L (ref 135–145)
TCO2: 23 mmol/L (ref 22–32)

## 2019-09-17 LAB — DIFFERENTIAL
Abs Immature Granulocytes: 0.01 K/uL (ref 0.00–0.07)
Basophils Absolute: 0 K/uL (ref 0.0–0.1)
Basophils Relative: 0 %
Eosinophils Absolute: 0 K/uL (ref 0.0–0.5)
Eosinophils Relative: 0 %
Immature Granulocytes: 0 %
Lymphocytes Relative: 31 %
Lymphs Abs: 2.2 K/uL (ref 0.7–4.0)
Monocytes Absolute: 0.4 K/uL (ref 0.1–1.0)
Monocytes Relative: 5 %
Neutro Abs: 4.6 K/uL (ref 1.7–7.7)
Neutrophils Relative %: 64 %

## 2019-09-17 LAB — APTT: aPTT: 28 seconds (ref 24–36)

## 2019-09-17 LAB — I-STAT BETA HCG BLOOD, ED (MC, WL, AP ONLY): I-stat hCG, quantitative: 6.1 m[IU]/mL — ABNORMAL HIGH (ref <5)

## 2019-09-17 LAB — PROTIME-INR
INR: 1.1 (ref 0.8–1.2)
Prothrombin Time: 14 s (ref 11.4–15.2)

## 2019-09-17 MED ORDER — SODIUM CHLORIDE 0.9 % IV SOLN: 100.0000 mL/h | INTRAVENOUS | Status: DC

## 2019-09-17 MED ORDER — SODIUM CHLORIDE 0.9 % IV BOLUS: 500.0000 mL | Freq: Once | INTRAVENOUS | Status: DC

## 2019-09-17 NOTE — ED Triage Notes (Addendum)
Patient had a fall after getting out of bed, but does not know why she fell. Pt hit her head on bedside table and had a loss of consciousness. Currently taking Plavix. Given 158mcg Fentanyl by EMS

## 2019-09-17 NOTE — Discharge Instructions (Addendum)
As discussed, your evaluation today has been largely reassuring.  But, it is important that you monitor your condition carefully, and do not hesitate to return to the ED if you develop new, or concerning changes in your condition. ? ?Otherwise, please follow-up with your physician for appropriate ongoing care. ? ?

## 2019-09-17 NOTE — ED Notes (Signed)
Pt was discharged from the ED. Pt read and understood discharge paperwork. Pt had vital signs completed. Pt conscious, breathing, and A&Ox4. No distress noted. Pt speaking in complete sentences. Pt brought out of the ED via wheelchair.  

## 2019-09-17 NOTE — ED Provider Notes (Signed)
[facility] EMERGENCY DEPARTMENT Provider Note   CSN: [id number] Arrival date & time: 09/17/19  2148     History Chief Complaint  Patient presents with  . Trauma    Level 2    Christina Orozco is a 49 y.o. female.  HPI Multiple medical issues presents via EMS as a level 2 trauma. Patient is confused, slow to respond, level 5 caveat secondary to acuity of condition. Per EMS report the patient was witnessed to stand up, subsequently have an episode of syncope versus near syncope, falling to the ground, striking her head.  She was found there by family members.  On EMS arrival the patient was confused, though interactive.  She is complaining of pain on the right face, left knee medially.  Pain is sore, worse with motion or palpation. No medication taken for relief.  EMS personnel note that the patient is found to have multiple medical issues, including hypertension, prior stroke, is on a blood thinning medication.   Cardiovascular and Mediastinum Hypertensive cardiovascular disease Benign essential HTN Essential hypertension  Respiratory Asthma, mild intermittent Rhinitis, allergic Pneumonia of left lower lobe due to infectious organism Congestion of nasal sinus Asthma  Digestive Gastroesophageal reflux disease without esophagitis  Endocrine Diabetes ([place]) Diabetes mellitus type 2 in obese ([place]) Poorly controlled type 2 diabetes mellitus with peripheral neuropathy ([place])  Nervous and Auditory Central pontine myelinolysis (HCC) Neuropathy 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[place]) PRES (posterior reversible encephalopathy syndrome) Bilateral carpal tunnel syndrome  Musculoskeletal and Integument Arthritis  Genitourinary Chronic kidney disease (CKD), stage III (moderate) Stage 3 chronic kidney disease Kidney stone  Other Vitamin D deficiency Chronic right shoulder pain Dizziness Hyperglycemia Ataxia Gait instability Lesion of pons Slurred  speech Insomnia Anxiety as acute reaction to exceptional stress Hyperlipidemia Anemia of chronic disease Seizures (HCC) Hypokalemia History of CVA with residual deficit Seizure (HCC) Acute on chronic anemia Hypoalbuminemia due to protein-calorie malnutrition (HCC) Morbidly obese (HCC) SOB (shortness of breath) Candidiasis Diastolic dysfunction Chronic nonintractable headache Debility Constipation Dizziness and giddiness Abnormality of gait    Social History   Tobacco Use  . Smoking status: Not on file  Substance Use Topics  . Alcohol use: Not on file  . Drug use: Not on file    Home Medications albuterol (PROAIR HFA) 108 (90 Base) MCG/ACT inhaler amLODipine (NORVASC) 10 MG tablet atorvastatin (LIPITOR) 40 MG tablet carvedilol (COREG) 25 MG tablet Cholecalciferol (VITAMIN D3 PO) clopidogrel (PLAVIX) 75 MG tablet cyclobenzaprine (FLEXERIL) 5 MG tablet doxazosin (CARDURA) 4 MG tablet DULoxetine (CYMBALTA) 60 MG capsule Eszopiclone 3 MG TABS ferrous sulfate 325 (65 FE) MG tablet(Expired) furosemide (LASIX) 40 MG tablet glucose blood (CONTOUR NEXT TEST) test strip hydrALAZINE (APRESOLINE) 50 MG tablet insulin glargine (LANTUS) 100 unit/mL SOPN levETIRAcetam (KEPPRA) 500 MG tablet meclizine (ANTIVERT) 25 MG tablet nortriptyline (PAMELOR) 10 MG capsule pantoprazole (PROTONIX) 40 MG tablet zonisamide (ZONEGRAN) 100 MG capsule  Allergies    Patient has no allergy information on record.  Review of Systems   Review of Systems  Unable to perform ROS: Acuity of condition    Physical Exam Updated Vital Signs BP (!) 150/88 Comment: Manual  Pulse 86   Temp 97.8 F (36.6 C) Comment: Oral  Resp 17   Ht 5\' 2"  (1.575 m)   Wt 85 kg   SpO2 93% Comment: Room air  BMI 34.27 kg/m   Physical Exam Vitals and nursing note reviewed.  Constitutional:      General: She is not in acute distress.  Appearance: She is well-developed. She is obese. She is ill-appearing.   HENT:     Head: Normocephalic.   Eyes:     Conjunctiva/sclera: Conjunctivae normal.  Neck:     Comments: Cervical collar in place Cardiovascular:     Rate and Rhythm: Normal rate and regular rhythm.  Pulmonary:     Effort: Pulmonary effort is normal. No respiratory distress.     Breath sounds: Normal breath sounds. No stridor.  Abdominal:     General: There is no distension.  Musculoskeletal:     Comments: Gross deformities, tenderness palpation left medial superior knee, without effusion, deformity, crepitus.  Patient moves the hip, though not the knee, moves the ankle as well.  Skin:    General: Skin is warm and dry.  Neurological:     Mental Status: She is alert.     Cranial Nerves: No cranial nerve deficit.     Motor: Atrophy present. No tremor.     Coordination: Coordination normal.     Comments: Slow to follow commands, but moves all extremities spontaneously.  She does have some hesitancy to move that left knee, notes that she has recently received injections there due to chronic pain issues.  Psychiatric:        Behavior: Behavior is slowed and withdrawn.     ED Results / Procedures / Treatments   Labs (all labs ordered are listed, but only abnormal results are displayed) Labs Reviewed  CBC - Abnormal; Notable for the following components:      Result Value   RBC 3.21 (*)    Hemoglobin 9.1 (*)    HCT 28.8 (*)    All other components within normal limits  COMPREHENSIVE METABOLIC PANEL - Abnormal; Notable for the following components:   CO2 21 (*)    Glucose, Bld 128 (*)    BUN 39 (*)    Creatinine, Ser 2.60 (*)    AST 13 (*)    Total Bilirubin 0.2 (*)    GFR calc non Af Amer 21 (*)    GFR calc Af Amer 25 (*)    All other components within normal limits  I-STAT CHEM 8, ED - Abnormal; Notable for the following components:   BUN 39 (*)    Creatinine, Ser 2.90 (*)    Glucose, Bld 122 (*)    Hemoglobin 8.8 (*)    HCT 26.0 (*)    All other components within  normal limits  I-STAT BETA HCG BLOOD, ED ([facility], [facility], [facility] ONLY) - Abnormal; Notable for the following components:   I-stat hCG, quantitative 6.1 (*)    All other components within normal limits  PROTIME-INR  APTT  DIFFERENTIAL  RAPID URINE DRUG SCREEN, HOSP PERFORMED  URINALYSIS, ROUTINE W REFLEX MICROSCOPIC    EKG EKG Interpretation  Date/Time:  Wednesday September 17 2019 21:58:51 EDT Ventricular Rate:  83 PR Interval:    QRS Duration: 87 QT Interval:  387 QTC Calculation: 455 R Axis:   4 Text Interpretation: Sinus rhythm Low voltage, precordial leads T wave abnormality Abnormal ECG Confirmed by Carmin Muskrat [phone]) on 09/17/2019 10:01:52 PM   Radiology DG Knee 1-2 Views Left  Result Date: 09/17/2019 CLINICAL DATA:  Fall, left knee pain EXAM: LEFT KNEE - 1-2 VIEW COMPARISON:  None. FINDINGS: Anterior soft tissue swelling. No acute bony abnormality. Specifically, no fracture, subluxation, or dislocation. Early joint space narrowing and spurring. IMPRESSION: Anterior soft tissue swelling.  No acute bony abnormality. Electronically Signed   By: Lennette Bihari  [place] M.D.   On: 09/17/2019 23:01   CT HEAD WO CONTRAST  Result Date: 09/17/2019 CLINICAL DATA:  49 year old female with trauma. EXAM: CT HEAD WITHOUT CONTRAST CT MAXILLOFACIAL WITHOUT CONTRAST CT CERVICAL SPINE WITHOUT CONTRAST TECHNIQUE: Multidetector CT imaging of the head, cervical spine, and maxillofacial structures were performed using the standard protocol without intravenous contrast. Multiplanar CT image reconstructions of the cervical spine and maxillofacial structures were also generated. COMPARISON:  Head CT dated 05/12/2019. FINDINGS: CT HEAD FINDINGS Brain: The ventricles and sulci appropriate size for patient's age. Old left thalamic infarct noted. There is no acute intracranial hemorrhage. No mass effect or midline shift. No extra-axial fluid collection. Vascular: No hyperdense vessel or unexpected calcification. Skull: Normal.  Negative for fracture or focal lesion. Other: Small subcutaneous lipoma over the forehead. CT MAXILLOFACIAL FINDINGS Osseous: No acute fracture or dislocation. Orbits: The globes and retro-orbital fat are preserved. Sinuses: Clear. Soft tissues: Negative. CT CERVICAL SPINE FINDINGS Alignment: No acute subluxation. There is straightening of normal cervical lordosis. C5-C6 ACDF. The hardware appears intact. Skull base and vertebrae: No acute fracture. Coarsened trabeculation of the T1 vertebra similar to prior CT of 2019, likely a hemangioma. Soft tissues and spinal canal: No prevertebral fluid or swelling. No visible canal hematoma. Disc levels:  No acute findings. Interbody fusion at C5-C6. Upper chest: Negative. Other: None IMPRESSION: 1. No acute intracranial pathology. 2. No acute/traumatic cervical spine pathology. 3. No acute facial bone fractures. Electronically Signed   By: Anner Crete M.D.   On: 09/17/2019 22:49   CT CERVICAL SPINE WO CONTRAST  Result Date: 09/17/2019 CLINICAL DATA:  49 year old female with trauma. EXAM: CT HEAD WITHOUT CONTRAST CT MAXILLOFACIAL WITHOUT CONTRAST CT CERVICAL SPINE WITHOUT CONTRAST TECHNIQUE: Multidetector CT imaging of the head, cervical spine, and maxillofacial structures were performed using the standard protocol without intravenous contrast. Multiplanar CT image reconstructions of the cervical spine and maxillofacial structures were also generated. COMPARISON:  Head CT dated 05/12/2019. FINDINGS: CT HEAD FINDINGS Brain: The ventricles and sulci appropriate size for patient's age. Old left thalamic infarct noted. There is no acute intracranial hemorrhage. No mass effect or midline shift. No extra-axial fluid collection. Vascular: No hyperdense vessel or unexpected calcification. Skull: Normal. Negative for fracture or focal lesion. Other: Small subcutaneous lipoma over the forehead. CT MAXILLOFACIAL FINDINGS Osseous: No acute fracture or dislocation. Orbits: The  globes and retro-orbital fat are preserved. Sinuses: Clear. Soft tissues: Negative. CT CERVICAL SPINE FINDINGS Alignment: No acute subluxation. There is straightening of normal cervical lordosis. C5-C6 ACDF. The hardware appears intact. Skull base and vertebrae: No acute fracture. Coarsened trabeculation of the T1 vertebra similar to prior CT of 2019, likely a hemangioma. Soft tissues and spinal canal: No prevertebral fluid or swelling. No visible canal hematoma. Disc levels:  No acute findings. Interbody fusion at C5-C6. Upper chest: Negative. Other: None IMPRESSION: 1. No acute intracranial pathology. 2. No acute/traumatic cervical spine pathology. 3. No acute facial bone fractures. Electronically Signed   By: Anner Crete M.D.   On: 09/17/2019 22:49   DG FEMUR MIN 2 VIEWS LEFT  Result Date: 09/17/2019 CLINICAL DATA:  Fall, left knee pain EXAM: LEFT FEMUR 2 VIEWS COMPARISON:  None. FINDINGS: There is no evidence of fracture or other focal bone lesions. Soft tissues are unremarkable. IMPRESSION: Negative. Electronically Signed   By: Rolm Baptise M.D.   On: 09/17/2019 23:02   CT Maxillofacial Wo Contrast  Result Date: 09/17/2019 CLINICAL DATA:  49 year old female with trauma. EXAM: CT HEAD WITHOUT  CONTRAST CT MAXILLOFACIAL WITHOUT CONTRAST CT CERVICAL SPINE WITHOUT CONTRAST TECHNIQUE: Multidetector CT imaging of the head, cervical spine, and maxillofacial structures were performed using the standard protocol without intravenous contrast. Multiplanar CT image reconstructions of the cervical spine and maxillofacial structures were also generated. COMPARISON:  Head CT dated 05/12/2019. FINDINGS: CT HEAD FINDINGS Brain: The ventricles and sulci appropriate size for patient's age. Old left thalamic infarct noted. There is no acute intracranial hemorrhage. No mass effect or midline shift. No extra-axial fluid collection. Vascular: No hyperdense vessel or unexpected calcification. Skull: Normal. Negative for  fracture or focal lesion. Other: Small subcutaneous lipoma over the forehead. CT MAXILLOFACIAL FINDINGS Osseous: No acute fracture or dislocation. Orbits: The globes and retro-orbital fat are preserved. Sinuses: Clear. Soft tissues: Negative. CT CERVICAL SPINE FINDINGS Alignment: No acute subluxation. There is straightening of normal cervical lordosis. C5-C6 ACDF. The hardware appears intact. Skull base and vertebrae: No acute fracture. Coarsened trabeculation of the T1 vertebra similar to prior CT of 2019, likely a hemangioma. Soft tissues and spinal canal: No prevertebral fluid or swelling. No visible canal hematoma. Disc levels:  No acute findings. Interbody fusion at C5-C6. Upper chest: Negative. Other: None IMPRESSION: 1. No acute intracranial pathology. 2. No acute/traumatic cervical spine pathology. 3. No acute facial bone fractures. Electronically Signed   By: Anner Crete M.D.   On: 09/17/2019 22:49    Procedures Procedures (including critical care time)  Medications Ordered in ED Medications  sodium chloride 0.9 % bolus 500 mL (has no administration in time range)    Followed by  0.9 %  sodium chloride infusion (has no administration in time range)    ED Course  I have reviewed the triage vital signs and the nursing notes.  Pertinent labs & imaging results that were available during my care of the patient were reviewed by me and considered in my medical decision making (see chart for details).   With consideration of fall versus syncope versus seizure, given the patient's use of anticoagulant, multiple medication, broad differential considered, including prior as well as intracranial hemorrhage, fracture, infection.  Labs, CT, x-ray all ordered. MDM Rules/Calculators/A&P                      11:26 PM Patient is awake, alert, speaking much more clearly, oriented appropriately.  We discussed all findings at length, including my interpretation of CT scans, x-rays.  No evidence for  fracture, intracranial hemorrhage.  Discussed possibilities for occult fracture of her knee, though this is less likely given mechanism.  However, she understands return precautions in that regard.  Labs notable for demonstration of chronic kidney disease, without substantial progression.  Labs otherwise reassuring, no evidence for substantial electrolyte abnormalities.  With improvement here, reassuring findings as above, the patient is comfortable with, appropriate for discharge with close outpatient follow-up. Final Clinical Impression(s) / ED Diagnoses Final diagnoses:  Fall  Trauma     Carmin Muskrat, MD 09/17/19 2327

## 2019-10-28 DIAGNOSIS — Z0289 Encounter for other administrative examinations: Secondary | ICD-10-CM

## 2020-03-15 ENCOUNTER — Encounter: Payer: Self-pay | Admitting: *Deleted

## 2020-03-15 ENCOUNTER — Telehealth: Payer: Self-pay | Admitting: *Deleted

## 2020-03-15 NOTE — Telephone Encounter (Addendum)
Dr Oval Linsey will not be having virtual clinic 10/5. Left message to call back to rescheduled  Mychart message sent as well

## 2020-03-16 ENCOUNTER — Encounter: Payer: Self-pay | Admitting: Cardiology

## 2020-03-16 ENCOUNTER — Telehealth (INDEPENDENT_AMBULATORY_CARE_PROVIDER_SITE_OTHER): Payer: Medicaid Other | Admitting: Cardiology

## 2020-03-16 ENCOUNTER — Telehealth: Payer: Medicaid Other | Admitting: Cardiovascular Disease

## 2020-03-16 VITALS — BP 132/79 | HR 81 | Ht 62.0 in | Wt 161.0 lb

## 2020-03-16 DIAGNOSIS — I10 Essential (primary) hypertension: Secondary | ICD-10-CM | POA: Diagnosis not present

## 2020-03-16 DIAGNOSIS — N1831 Chronic kidney disease, stage 3a: Secondary | ICD-10-CM

## 2020-03-16 DIAGNOSIS — E1169 Type 2 diabetes mellitus with other specified complication: Secondary | ICD-10-CM

## 2020-03-16 DIAGNOSIS — I119 Hypertensive heart disease without heart failure: Secondary | ICD-10-CM

## 2020-03-16 DIAGNOSIS — E669 Obesity, unspecified: Secondary | ICD-10-CM

## 2020-03-16 NOTE — Patient Instructions (Signed)
Medication Instructions:  STOP- Hydralazine  *If you need a refill on your cardiac medications before your next appointment, please call your pharmacy*   Lab Work: None Ordered   Testing/Procedures: None Ordered   Follow-Up: At [facility], you and your health needs are our priority.  As part of our continuing mission to provide you with exceptional heart care, we have created designated Provider Care Teams.  These Care Teams include your primary Cardiologist (physician) and Advanced Practice Providers (APPs -  Physician Assistants and Nurse Practitioners) who all work together to provide you with the care you need, when you need it.  We recommend signing up for the patient portal called "MyChart".  Sign up information is provided on this After Visit Summary.  MyChart is used to connect with patients for Virtual Visits (Telemedicine).  Patients are able to view lab/test results, encounter notes, upcoming appointments, etc.  Non-urgent messages can be sent to your provider as well.   To learn more about what you can do with MyChart, go to [email or web address].    Your next appointment:   Tuesday October 26th @ 3:45 pm  The format for your next appointment:   Virtual Visit   Provider:   You will see one of the following Advanced Practice Providers on your designated Care Team:    Kerin Ransom, [place]    Other Instructions  Thursday January 6th @ 9:40 am with Dr Oval Linsey

## 2020-03-16 NOTE — Progress Notes (Signed)
Virtual Visit via Telephone Note   This visit type was conducted due to national recommendations for restrictions regarding the COVID-19 Pandemic (e.g. social distancing) in an effort to limit this patient's exposure and mitigate transmission in our community.  Due to her co-morbid illnesses, this patient is at least at moderate risk for complications without adequate follow up.  This format is felt to be most appropriate for this patient at this time.  The patient did not have access to video technology/had technical difficulties with video requiring transitioning to audio format only (telephone).  All issues noted in this document were discussed and addressed.  No physical exam could be performed with this format.  Please refer to the patient's chart for her  consent to telehealth for [facility].    Date:  03/16/2020   ID:  Christina Orozco, DOB [birth year], MRN [id number] The patient was identified using 2 identifiers.  Patient Location: Home Provider Location: Home Office  PCP:  Patient, No Pcp Per  Cardiologist:  Skeet Latch, MD  Electrophysiologist:  None   Evaluation Performed:  Follow-Up Visit  Chief Complaint:  none  History of Present Illness:    Christina Orozco is a 49 y.o. female with a medical history of HTN, CRI-3, HCVD with severe LVH by echo in Feb 2019, IDDM, and recurrent CVAs. She was admitted to [facility] with a small pontine CVA in Feb 2019.  She was placed on full dose ASA and Plavix. Echo was unchanged from previous studies showing her EF to be 65-70% with severe LVH, grade 1 DD, and mild MR. Carotid dopplers showed 1-39% narrowing bilaterally. She was seen in follow up and set up for a TEE and loop recorder implant.  TEE 08/27/2017 showed no obvious source of emboli or clot.  A loop recorder did not show any arrhthymias.   She was then admitted in Oct 2019 after she was found down in post ictal state at the [facility]. While in CT she had a seizure and she  was intubated. Her Glucose on admission was >500 and her B/P 220/ 95. It's presumed she had been non compliant with her medications as she has had problems affording medications in the past. She was extubated 03/29/18 but had evidence of encephalopathy and she was discharged to in patient rehab.   In Sept 2020 she was admitted to [facility] "after several strokes".  She also has a neurologist, PCP,  endocrinologist, and psychiatrist at [facility] and a nephrologist (Dr Olivia Mackie) in [place].  She asked for Dr Oval Linsey to be her cardiologist of record. In Feb 2021 she had her Loop recorder explanted at her request.   She was contacted today for routine follow-up.  The patient tells me that her blood pressure has been running on the low side.  She said her nephrologist was somewhat concerned about this and adjusted her diuretic dose.  He did not adjust any of her other antihypertensive medications.  The patient admitted to me that she is not taking her hydralazine 4 times a day as listed, only twice a day.  She has been compliant with her other medications.  The patient does not have symptoms concerning for COVID-19 infection (fever, chills, cough, or new shortness of breath).    Past Medical History:  Diagnosis Date  . Anemia    severe  . Asthma   . CKD (chronic kidney disease)    stage IV  . DDD (degenerative disc disease), cervical   . Diabetes  mellitus ([place])   . Dislocated shoulder    right  . GERD (gastroesophageal reflux disease)   . Hypertension   . Neuropathy   . Stroke [facility])    Past Surgical History:  Procedure Laterality Date  . ADENOIDECTOMY    . APPENDECTOMY    . CERVICAL SPINE SURGERY  06/2012   C5-C6  . EYE SURGERY     left eye surgery   . FOOT SURGERY    . LOOP RECORDER INSERTION N/A 08/27/2017   Procedure: LOOP RECORDER INSERTION;  Surgeon: Sanda Klein, MD;  Location: [place] [facility];  Service: Cardiovascular;  Laterality: N/A;  . SHOULDER SURGERY    .  TEE WITHOUT CARDIOVERSION N/A 08/27/2017   Procedure: TRANSESOPHAGEAL ECHOCARDIOGRAM (TEE);  Surgeon: Sanda Klein, MD;  Location: [facility];  Service: Cardiovascular;  Laterality: N/A;  . TONSILLECTOMY       Current Meds  Medication Sig  . albuterol (PROAIR HFA) 108 (90 Base) MCG/ACT inhaler INHALE 1 OR 2 PUFFS INTO THE LUNGS EVERY 6 HOURS AS NEEDED FOR WHEEZING OR SHORTNESS OF BREATH  . amLODipine (NORVASC) 10 MG tablet TAKE 1 TABLET BY MOUTH EVERY DAY  . atorvastatin (LIPITOR) 80 MG tablet Take 80 mg by mouth daily.  . carvedilol (COREG) 25 MG tablet Take 1 tablet (25 mg total) by mouth 2 (two) times daily with a meal. Take 25 mg twice daily  . Cholecalciferol (VITAMIN D3 PO) Take 2,000 mg by mouth daily.  . clopidogrel (PLAVIX) 75 MG tablet Take 1 tablet (75 mg total) by mouth daily.  Marland Kitchen doxazosin (CARDURA) 4 MG tablet TAKE 1 TABLET BY MOUTH EVERY DAY  . DULoxetine (CYMBALTA) 60 MG capsule Take 60 mg by mouth daily.  . Eszopiclone 3 MG TABS Take 3 mg by mouth at bedtime. Take immediately before bedtime  . ferrous sulfate 325 (65 FE) MG tablet Take 1 tablet (325 mg total) by mouth daily.  . furosemide (LASIX) 20 MG tablet Take 1 tablet by mouth daily.  Marland Kitchen glucose blood (CONTOUR NEXT TEST) test strip 1 each by Other route 2 (two) times daily. And lancets 2/day (Patient taking differently: 1 each by Other route daily. And lancets 2/day)  . hydrALAZINE (APRESOLINE) 50 MG tablet Take 1 tablet (50 mg total) by mouth 3 (three) times daily.  Marland Kitchen levETIRAcetam (KEPPRA) 500 MG tablet TAKE 1 TABLET(500 MG) BY MOUTH TWICE DAILY  . meclizine (ANTIVERT) 25 MG tablet TAKE 1 TABLET(25 MG) BY MOUTH THREE TIMES DAILY AS NEEDED FOR DIZZINESS  . nortriptyline (PAMELOR) 10 MG capsule Take 10 mg by mouth at bedtime.  . pantoprazole (PROTONIX) 40 MG tablet TAKE 1 TABLET(40 MG) BY MOUTH DAILY  . Semaglutide,0.25 or 0.5MG /DOS, (OZEMPIC, 0.25 OR 0.5 MG/DOSE,) 2 MG/1.5ML SOPN INJECT 0.5 MG(0.4 ML) INTO THE SKIN  ONCE A WEEK  . zonisamide (ZONEGRAN) 100 MG capsule Take 100 mg by mouth at bedtime.  . [DISCONTINUED] cyclobenzaprine (FLEXERIL) 5 MG tablet Take 5 mg by mouth as needed for muscle spasms.  . [DISCONTINUED] insulin glargine (LANTUS) 100 unit/mL SOPN Inject 0.2 mLs (20 Units total) into the skin at bedtime.   Current Facility-Administered Medications for the 03/16/20 encounter (Telemedicine) with Erlene Quan, PA-C  Medication  . lidocaine-EPINEPHrine (XYLOCAINE W/EPI) 1 %-1:100000 (with pres) injection 10 mL     Allergies:   Lisinopril, Penicillins, and Gabapentin   Social History   Tobacco Use  . Smoking status: Never Smoker  . Smokeless tobacco: Never Used  Vaping Use  . Vaping  Use: Never used  Substance Use Topics  . Alcohol use: No  . Drug use: No     Family Hx: The patient's family history includes Breast cancer (age of onset: 39) in her sister; CAD in her mother; Cancer in an other family member; Diabetes in an other family member; Heart disease in an other family member; Heart failure in her mother; Seizures in her maternal uncle; Vascular Disease in her mother.  ROS:   Please see the history of present illness.    All other systems reviewed and are negative.   Prior CV studies:   The following studies were reviewed today:  Myoview 09/28/2017-  Normal perfusion  Nuclear stress EF: 61%.  The study is normal.  This is a low risk study  TEE 08/27/2017- Study Conclusions   - Left ventricle: Systolic function was normal. The estimated  ejection fraction was in the range of 55% to 60%. Wall motion was  normal; there were no regional wall motion abnormalities. Left  ventricular diastolic function parameters were normal.  - Left atrium: No evidence of thrombus in the atrial cavity or  appendage. No evidence of thrombus in the atrial cavity or  appendage. No spontaneous echo contrast was observed.  - Right atrium: No evidence of thrombus in the atrial  cavity or  appendage.  - Atrial septum: No defect or patent foramen ovale was identified.  Echo contrast study showed no right-to-left atrial level shunt,  at baseline or with provocation.     Labs/Other Tests and Data Reviewed:    EKG:  An ECG dated 09/17/2019 was personally reviewed today and demonstrated:  NSR, HR 83  Recent Labs: 09/17/2019: ALT 16; BUN 39; Creatinine, Ser 2.90; Hemoglobin 8.8; Platelets 220; Potassium 3.7; Sodium 142   Recent Lipid Panel Lab Results  Component Value Date/Time   CHOL 99 07/26/2017 11:18 AM   TRIG 138 03/27/2018 04:14 AM   HDL 42 07/26/2017 11:18 AM   CHOLHDL 2.4 07/26/2017 11:18 AM   LDLCALC 43 07/26/2017 11:18 AM    Wt Readings from Last 3 Encounters:  03/16/20 161 lb (73 kg)  09/17/19 187 lb 6.3 oz (85 kg)  09/13/18 191 lb (86.6 kg)     Objective:    Vital Signs:  BP 132/79   Pulse 81   Ht 5\' 2"  (1.575 m)   Wt 161 lb (73 kg)   LMP 05/22/2018 (Within Days)   BMI 29.45 kg/m    VITAL SIGNS:  reviewed  ASSESSMENT & PLAN:    Hypertensive cardiovascular disease Normal LVF with severe LVH and grade 1 DD on echo Feb 2019.  Her B/P has been running low and she is on multiple anti hypertensive's. Since she is only taking her Hydralazine BID, I think I would start be leaving this off and following her B/P.   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  Recent neurology notes suggests her "stroke" in Sept 2020 may actually have been a seizure. She now has Medicaid.   CVA (cerebral vascular accident) ([place]) H/O CVA 2017 with recurrent pontine CVA Feb 2019 and recent CVA Sept 2020- followed at [facility]- Dr Doy Hutching  Chronic kidney disease (CKD), stage III (moderate) Followed by Dr Olivia Mackie in [facility]  Diabetes mellitus type 2, with complication, on long term insulin pump (HCC) Type 2 IDDM with CRI-3- Followed by endo at  [facility].  Plan:  Stop hydralazine.  Virtual f/u with  me in 2-3 weeks for B/P check.  Dr Oval Linsey to see in Jan 2022.   COVID-19 Education: The signs and symptoms of COVID-19 were discussed with the patient and how to seek care for testing (follow up with PCP or arrange E-visit).  The importance of social distancing was discussed today.  Time:   Today, I have spent 20 minutes with the patient with telehealth technology discussing the above problems.     Medication Adjustments/Labs and Tests Ordered: Current medicines are reviewed at length with the patient today.  Concerns regarding medicines are outlined above.   Tests Ordered: No orders of the defined types were placed in this encounter.   Medication Changes: No orders of the defined types were placed in this encounter.   Follow Up:  In Person with Dr Oval Linsey in Jan   Signed, Shakur Lembo Haivana [place], [place]  03/16/2020 3:25 PM    [place]

## 2020-03-17 NOTE — Telephone Encounter (Signed)
Patients appointment has been rescheduled.

## 2020-04-06 ENCOUNTER — Other Ambulatory Visit: Payer: Self-pay

## 2020-04-06 ENCOUNTER — Telehealth (INDEPENDENT_AMBULATORY_CARE_PROVIDER_SITE_OTHER): Payer: Medicaid Other | Admitting: Cardiology

## 2020-04-06 ENCOUNTER — Encounter: Payer: Self-pay | Admitting: Cardiology

## 2020-04-06 VITALS — BP 132/81 | HR 81 | Ht 62.5 in | Wt 155.0 lb

## 2020-04-06 DIAGNOSIS — J4520 Mild intermittent asthma, uncomplicated: Secondary | ICD-10-CM

## 2020-04-06 DIAGNOSIS — I119 Hypertensive heart disease without heart failure: Secondary | ICD-10-CM | POA: Diagnosis not present

## 2020-04-06 DIAGNOSIS — E785 Hyperlipidemia, unspecified: Secondary | ICD-10-CM

## 2020-04-06 DIAGNOSIS — E119 Type 2 diabetes mellitus without complications: Secondary | ICD-10-CM

## 2020-04-06 DIAGNOSIS — Z87898 Personal history of other specified conditions: Secondary | ICD-10-CM | POA: Diagnosis not present

## 2020-04-06 DIAGNOSIS — I10 Essential (primary) hypertension: Secondary | ICD-10-CM

## 2020-04-06 DIAGNOSIS — E1169 Type 2 diabetes mellitus with other specified complication: Secondary | ICD-10-CM | POA: Diagnosis not present

## 2020-04-06 DIAGNOSIS — I6930 Unspecified sequelae of cerebral infarction: Secondary | ICD-10-CM

## 2020-04-06 DIAGNOSIS — E669 Obesity, unspecified: Secondary | ICD-10-CM

## 2020-04-06 NOTE — Patient Instructions (Signed)
Medication Instructions:  Your physician recommends that you continue on your current medications as directed. Please refer to the Current Medication list given to you today.  *If you need a refill on your cardiac medications before your next appointment, please call your pharmacy*   Lab Work: NONE   If you have labs (blood work) drawn today and your tests are completely normal, you will receive your results only by:  [place] (if you have MyChart) OR  A paper copy in the mail If you have any lab test that is abnormal or we need to change your treatment, we will call you to review the results.   Testing/Procedures: NONE    Follow-Up: At [facility], you and your health needs are our priority.  As part of our continuing mission to provide you with exceptional heart care, we have created designated Provider Care Teams.  These Care Teams include your primary Cardiologist (physician) and Advanced Practice Providers (APPs -  Physician Assistants and Nurse Practitioners) who all work together to provide you with the care you need, when you need it.  We recommend signing up for the patient portal called "MyChart".  Sign up information is provided on this After Visit Summary.  MyChart is used to connect with patients for Virtual Visits (Telemedicine).  Patients are able to view lab/test results, encounter notes, upcoming appointments, etc.  Non-urgent messages can be sent to your provider as well.   To learn more about what you can do with MyChart, go to [email or web address].    Your next appointment:    As scheduled   The format for your next appointment:   In Person  Provider:   Skeet Latch, MD   Other Instructions Thank you for choosing [facility]!

## 2020-04-06 NOTE — Progress Notes (Signed)
Virtual Visit via Telephone Note   This visit type was conducted due to national recommendations for restrictions regarding the COVID-19 Pandemic (e.g. social distancing) in an effort to limit this patient's exposure and mitigate transmission in our community.  Due to her co-morbid illnesses, this patient is at least at moderate risk for complications without adequate follow up.  This format is felt to be most appropriate for this patient at this time.  The patient did not have access to video technology/had technical difficulties with video requiring transitioning to audio format only (telephone).  All issues noted in this document were discussed and addressed.  No physical exam could be performed with this format.  Please refer to the patient's chart for her  consent to telehealth for [facility].    Date:  04/06/2020   ID:  Christina Orozco, DOB [birth year], MRN [id number] The patient was identified using 2 identifiers.  Patient Location: Home Provider Location: Office/Clinic  PCP:  Patient, No Pcp Per  Cardiologist:  Skeet Latch, MD  Electrophysiologist:  None   Evaluation Performed:  Follow-Up Visit  Chief Complaint:  none  History of Present Illness:    Christina Orozco is a 49 y.o. female with a history of HTN,CRI-4-followed in [place],HCVD with severe LVHby echo in Feb 2019, NIDDM, and recurrent CVAs. [facility]. She was placed on full dose ASA and Plavix. Echo then was unchanged from previous studies showing her EF to be 65-70% with severe LVH, grade 1 DD, and mild MR. Carotid dopplers showed 1-39% narrowing bilaterally. She was seen in follow up and set up for a TEE and loop recorder implant. TEE 08/27/2017 showed no obvious source of emboli or clot. A loop recorder did not show any arrhthymias.   She was then admitted in Oct 2019 after she was found down in post ictal state at the [employer]. While in CT  she had a seizure and she was intubated. Her Glucoseonadmission was >500 and her B/P 220/ 95. It's presumed she had been non compliant with her medications as she has had problems affording medications in the past. She was extubated 03/29/18 but had evidence of encephalopathy and she was discharged to in patient rehab.   In Sept 2020 she was admitted to [facility] "after several strokes". She has a neurologist, PCP, endocrinologist, and psychiatrist at [facility] and a nephrologist (Dr Olivia Mackie) in [place].  She asked for Dr Oval Linsey to be her cardiologist of record. In Feb 2021 she had her Loop recorder explanted at her request.   I spoke with her on a tele health visit 03/16/2020.  She reported that her nephrologist was concerned about her B/P being "too low". The patient has lost 50 lbs over the past 18 months.  He backed off on her diuretics. I stopped her 50mg  BID Hydralazine that visit and she was contacted today for follow up. She reports she has been doing well, no unusual dyspnea, no syncope.  Her B/Phas been running 706 systolic.   The patient does not have symptoms concerning for COVID-19 infection (fever, chills, cough, or new shortness of breath).    Past Medical History:  Diagnosis Date  . Anemia    severe  . Asthma   . CKD (chronic kidney disease)    stage IV  . DDD (degenerative disc disease), cervical   . Diabetes mellitus ([place])   . Dislocated shoulder    right  . GERD (  gastroesophageal reflux disease)   . Hypertension   . Neuropathy   . Stroke [facility])    Past Surgical History:  Procedure Laterality Date  . ADENOIDECTOMY    . APPENDECTOMY    . CERVICAL SPINE SURGERY  06/2012   C5-C6  . EYE SURGERY     left eye surgery   . FOOT SURGERY    . LOOP RECORDER INSERTION N/A 08/27/2017   Procedure: LOOP RECORDER INSERTION;  Surgeon: Sanda Klein, MD;  Location: [place] [facility];  Service: Cardiovascular;  Laterality: N/A;  . SHOULDER SURGERY    . TEE  WITHOUT CARDIOVERSION N/A 08/27/2017   Procedure: TRANSESOPHAGEAL ECHOCARDIOGRAM (TEE);  Surgeon: Sanda Klein, MD;  Location: [facility];  Service: Cardiovascular;  Laterality: N/A;  . TONSILLECTOMY       Current Meds  Medication Sig  . albuterol (PROAIR HFA) 108 (90 Base) MCG/ACT inhaler INHALE 1 OR 2 PUFFS INTO THE LUNGS EVERY 6 HOURS AS NEEDED FOR WHEEZING OR SHORTNESS OF BREATH  . amLODipine (NORVASC) 10 MG tablet TAKE 1 TABLET BY MOUTH EVERY DAY  . atorvastatin (LIPITOR) 80 MG tablet Take 80 mg by mouth daily.  . carvedilol (COREG) 25 MG tablet Take 1 tablet (25 mg total) by mouth 2 (two) times daily with a meal. Take 25 mg twice daily  . Cholecalciferol (VITAMIN D3 PO) Take 2,000 mg by mouth daily.  . clopidogrel (PLAVIX) 75 MG tablet Take 1 tablet (75 mg total) by mouth daily.  Marland Kitchen doxazosin (CARDURA) 4 MG tablet TAKE 1 TABLET BY MOUTH EVERY DAY  . DULoxetine (CYMBALTA) 60 MG capsule Take 60 mg by mouth daily.  . Eszopiclone 3 MG TABS Take 3 mg by mouth at bedtime. Take immediately before bedtime  . ferrous sulfate 325 (65 FE) MG tablet Take 1 tablet (325 mg total) by mouth daily.  . furosemide (LASIX) 20 MG tablet Take 1 tablet by mouth daily.  Marland Kitchen glucose blood (CONTOUR NEXT TEST) test strip 1 each by Other route 2 (two) times daily. And lancets 2/day (Patient taking differently: 1 each by Other route daily. And lancets 2/day)  . levETIRAcetam (KEPPRA) 500 MG tablet TAKE 1 TABLET(500 MG) BY MOUTH TWICE DAILY  . meclizine (ANTIVERT) 25 MG tablet TAKE 1 TABLET(25 MG) BY MOUTH THREE TIMES DAILY AS NEEDED FOR DIZZINESS  . nortriptyline (PAMELOR) 10 MG capsule Take 10 mg by mouth at bedtime.  . pantoprazole (PROTONIX) 40 MG tablet TAKE 1 TABLET(40 MG) BY MOUTH DAILY  . Semaglutide,0.25 or 0.5MG /DOS, (OZEMPIC, 0.25 OR 0.5 MG/DOSE,) 2 MG/1.5ML SOPN INJECT 0.5 MG(0.4 ML) INTO THE SKIN ONCE A WEEK  . zonisamide (ZONEGRAN) 100 MG capsule Take 100 mg by mouth at bedtime.   Current  Facility-Administered Medications for the 04/06/20 encounter (Telemedicine) with Erlene Quan, PA-C  Medication  . lidocaine-EPINEPHrine (XYLOCAINE W/EPI) 1 %-1:100000 (with pres) injection 10 mL     Allergies:   Lisinopril, Penicillins, and Gabapentin   Social History   Tobacco Use  . Smoking status: Never Smoker  . Smokeless tobacco: Never Used  Vaping Use  . Vaping Use: Never used  Substance Use Topics  . Alcohol use: No  . Drug use: No     Family Hx: The patient's family history includes Breast cancer (age of onset: 60) in her sister; CAD in her mother; Cancer in an other family member; Diabetes in an other family member; Heart disease in an other family member; Heart failure in her mother; Seizures in her maternal uncle; Vascular  Disease in her mother.  ROS:   Please see the history of present illness.    All other systems reviewed and are negative.   Prior CV studies:   The following studies were reviewed today: Echo 08/27/2017- Study Conclusions   - Left ventricle: Systolic function was normal. The estimated  ejection fraction was in the range of 55% to 60%. Wall motion was  normal; there were no regional wall motion abnormalities. Left  ventricular diastolic function parameters were normal.  - Left atrium: No evidence of thrombus in the atrial cavity or  appendage. No evidence of thrombus in the atrial cavity or  appendage. No spontaneous echo contrast was observed.  - Right atrium: No evidence of thrombus in the atrial cavity or  appendage.  - Atrial septum: No defect or patent foramen ovale was identified.  Echo contrast study showed no right-to-left atrial level shunt,  at baseline or with provocation.   Myoview 09/28/2017-  Normal perfusion  Nuclear stress EF: 61%.  The study is normal.  This is a low risk study.      Labs/Other Tests and Data Reviewed:    EKG:  An ECG dated 09/17/2019 was personally reviewed today and  demonstrated:  NSR, HR 83  Recent Labs: 09/17/2019: ALT 16; BUN 39; Creatinine, Ser 2.90; Hemoglobin 8.8; Platelets 220; Potassium 3.7; Sodium 142   Recent Lipid Panel Lab Results  Component Value Date/Time   CHOL 99 07/26/2017 11:18 AM   TRIG 138 03/27/2018 04:14 AM   HDL 42 07/26/2017 11:18 AM   CHOLHDL 2.4 07/26/2017 11:18 AM   LDLCALC 43 07/26/2017 11:18 AM    Wt Readings from Last 3 Encounters:  04/06/20 155 lb (70.3 kg)  03/16/20 161 lb (73 kg)  09/17/19 187 lb 6.3 oz (85 kg)     Risk Assessment/Calculations:      Objective:    Vital Signs:  BP 132/81   Pulse 81   Ht 5' 2.5" (1.588 m)   Wt 155 lb (70.3 kg)   LMP 05/22/2018 (Within Days)   BMI 27.90 kg/m    VITAL SIGNS:  reviewed  ASSESSMENT & PLAN:    Hypertensive cardiovascular disease Normal LVF with severe LVH and grade 1 DD on echo Feb 2019.  Her B/P has been running low and she is on multiple anti hypertensive's. She has lost > 50 lbs over the past 18 months. Hydralazine stopped last tele health visit.    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 Recent neurology notes suggests her "stroke" in Sept 2020 may actually have been a seizure. She now has Medicaid.   CVA (cerebral vascular accident) ([place]) H/O CVA 2017 with recurrent pontine CVA Feb 2019and recent CVA Sept 2020- [facility]- Dr Doy Hutching  Chronic kidney disease (CKD), stage IV (moderate) Followed by Dr Olivia Mackie in [facility]  Diabetes mellitus type 2, with renal complication- Type 2 IDDM with CRI-4- Followed by endo at [facility].  Plan: Same Rx keep f/u with Dr Oval Linsey as scheduled.   COVID-19 Education: The signs and symptoms of COVID-19 were discussed with the patient and how to seek care for testing (follow up with PCP or arrange E-visit).  The importance of social distancing was discussed today.  Time:   Today, I have spent  10 minutes with the patient with telehealth technology discussing the above problems.     Medication Adjustments/Labs and Tests Ordered: Current medicines are reviewed  at length with the patient today.  Concerns regarding medicines are outlined above.   Tests Ordered: No orders of the defined types were placed in this encounter.   Medication Changes: No orders of the defined types were placed in this encounter.   Follow Up:  In Person with Dr Oval Linsey as scheduled  Signed, Kerin Ransom, PA-C  04/06/2020 2:34 PM    [place]

## 2020-04-22 DIAGNOSIS — G43009 Migraine without aura, not intractable, without status migrainosus: Secondary | ICD-10-CM | POA: Diagnosis present

## 2020-06-09 ENCOUNTER — Emergency Department (HOSPITAL_COMMUNITY)
Admission: EM | Admit: 2020-06-09 | Discharge: 2020-06-10 | Disposition: A | Discharge status: Home / Self Care | Payer: Medicaid Other | Attending: Emergency Medicine | Admitting: Emergency Medicine

## 2020-06-09 DIAGNOSIS — Z5321 Procedure and treatment not carried out due to patient leaving prior to being seen by health care provider: Secondary | ICD-10-CM | POA: Insufficient documentation

## 2020-06-09 DIAGNOSIS — R4701 Aphasia: Secondary | ICD-10-CM | POA: Diagnosis not present

## 2020-06-09 DIAGNOSIS — Z8673 Personal history of transient ischemic attack (TIA), and cerebral infarction without residual deficits: Secondary | ICD-10-CM | POA: Diagnosis not present

## 2020-06-09 DIAGNOSIS — R4781 Slurred speech: Secondary | ICD-10-CM | POA: Diagnosis not present

## 2020-06-09 LAB — BASIC METABOLIC PANEL
Anion gap: 10 (ref 5–15)
BUN: 28 mg/dL — ABNORMAL HIGH (ref 6–20)
CO2: 24 mmol/L (ref 22–32)
Calcium: 9.1 mg/dL (ref 8.9–10.3)
Chloride: 108 mmol/L (ref 98–111)
Creatinine, Ser: 2.42 mg/dL — ABNORMAL HIGH (ref 0.44–1.00)
GFR, Estimated: 24 mL/min — ABNORMAL LOW (ref >60)
Glucose, Bld: 100 mg/dL — ABNORMAL HIGH (ref 70–99)
Potassium: 4 mmol/L (ref 3.5–5.1)
Sodium: 142 mmol/L (ref 135–145)

## 2020-06-09 LAB — CBC
HCT: 28.3 % — ABNORMAL LOW (ref 36.0–46.0)
Hemoglobin: 9 g/dL — ABNORMAL LOW (ref 12.0–15.0)
MCH: 28.4 pg (ref 26.0–34.0)
MCHC: 31.8 g/dL (ref 30.0–36.0)
MCV: 89.3 fL (ref 80.0–100.0)
Platelets: 215 10*3/uL (ref 150–400)
RBC: 3.17 MIL/uL — ABNORMAL LOW (ref 3.87–5.11)
RDW: 12.7 % (ref 11.5–15.5)
WBC: 5.9 10*3/uL (ref 4.0–10.5)
nRBC: 0 % (ref 0.0–0.2)

## 2020-06-09 NOTE — ED Triage Notes (Signed)
Pt arrives via POV from home worsening slurred speech, aphasia, stumbling at home. Pt unable to give me a LSN. Pt reports hx of previous stroke. Pt states today symptoms seemed worse. No droop, drift, slurred speech noted intriage. VAN neg. VSS. Thinks difficulty walking may be because of excess ear wax in her ear.

## 2020-06-10 NOTE — ED Notes (Addendum)
Pt called 3x no answer  

## 2020-06-10 NOTE — ED Notes (Signed)
No answer for vitals recheck x1 

## 2020-06-11 ENCOUNTER — Ambulatory Visit (HOSPITAL_COMMUNITY)
Admission: EM | Admit: 2020-06-11 | Discharge: 2020-06-11 | Disposition: A | Discharge status: Home / Self Care | Payer: Medicaid Other | Attending: Internal Medicine | Admitting: Internal Medicine

## 2020-06-11 DIAGNOSIS — Z131 Encounter for screening for diabetes mellitus: Secondary | ICD-10-CM | POA: Diagnosis not present

## 2020-06-11 LAB — CBG MONITORING, ED: Glucose-Capillary: 117 mg/dL — ABNORMAL HIGH (ref 70–99)

## 2020-06-11 NOTE — ED Triage Notes (Addendum)
Pt c/o "unstable in balance" since 06/03/20 and saw her PCP on 12/23. Pt reports she fell on 12/24 that she attributes to feeling unbalanced.  Reports change in speech the past few days, "gasping for air" episodes, intermittent left CP the past several days. And c/o "brain freeze". Reports intermittent "difficulty swallowing".   Smile symmetrical, grips equal/strong, no arm drift, legs equal/strong. Pt able to follow 2-step commands, speech clear, able to repeat sentences back w/o complication.  CBG 117. Notified Dr. Lanny Cramp of pt status, health hx, and c/c. Dr. Lanny Cramp in for eval of pt and advised pt to go to ED STAT for higher level eval 2/2 symptoms, and significant PMH.  Pt states she feels she can drive to ED.

## 2020-06-11 NOTE — ED Notes (Signed)
Patient is being discharged from the Urgent Care and sent to the Emergency Department via POV. Per Dr. Lanny Cramp, patient is in need of higher level of care due to poss TIA symptoms Patient is aware and verbalizes understanding of plan of care.  Vitals:   06/11/20 1018  BP: (!) 136/100  Pulse: 81  Resp: 18  SpO2: 100%

## 2020-06-17 ENCOUNTER — Encounter: Payer: Self-pay | Admitting: Cardiovascular Disease

## 2020-06-17 ENCOUNTER — Ambulatory Visit (INDEPENDENT_AMBULATORY_CARE_PROVIDER_SITE_OTHER): Payer: Medicaid Other | Admitting: Cardiovascular Disease

## 2020-06-17 ENCOUNTER — Other Ambulatory Visit: Payer: Self-pay

## 2020-06-17 VITALS — BP 132/76 | HR 60 | Ht 62.5 in | Wt 154.2 lb

## 2020-06-17 DIAGNOSIS — I10 Essential (primary) hypertension: Secondary | ICD-10-CM | POA: Diagnosis not present

## 2020-06-17 DIAGNOSIS — N184 Chronic kidney disease, stage 4 (severe): Secondary | ICD-10-CM | POA: Diagnosis not present

## 2020-06-17 DIAGNOSIS — I6381 Other cerebral infarction due to occlusion or stenosis of small artery: Secondary | ICD-10-CM

## 2020-06-17 DIAGNOSIS — E7800 Pure hypercholesterolemia, unspecified: Secondary | ICD-10-CM

## 2020-06-17 NOTE — Patient Instructions (Signed)
Medication Instructions:  OK TO TAKE FUROSEMIDE (LASIX) 20 MG DAILY AS NEEDED FOR SWELLING/SHORTNESS OF BREATH  OK TO TAKE AN EXTRA HYDRALAZINE 25 MG EVERY 8 HOURS AS NEED FOR BLOOD PRESSURE ABOVE 150   *If you need a refill on your cardiac medications before your next appointment, please call your pharmacy*  Lab Work: LP/CMET TODAY   If you have labs (blood work) drawn today and your tests are completely normal, you will receive your results only by: Marland Kitchen MyChart Message (if you have MyChart) OR . A paper copy in the mail If you have any lab test that is abnormal or we need to change your treatment, we will call you to review the results.  Testing/Procedures: NONE   Follow-Up: At [facility], you and your health needs are our priority.  As part of our continuing mission to provide you with exceptional heart care, we have created designated Provider Care Teams.  These Care Teams include your primary Cardiologist (physician) and Advanced Practice Providers (APPs -  Physician Assistants and Nurse Practitioners) who all work together to provide you with the care you need, when you need it.  We recommend signing up for the patient portal called "MyChart".  Sign up information is provided on this After Visit Summary.  MyChart is used to connect with patients for Virtual Visits (Telemedicine).  Patients are able to view lab/test results, encounter notes, upcoming appointments, etc.  Non-urgent messages can be sent to your provider as well.   To learn more about what you can do with MyChart, go to [email or web address].    Your next appointment:   6 month(s)  The format for your next appointment:   In Person  Provider:   You may see Skeet Latch, MD or one of the following Advanced Practice Providers on your designated Care Team:    Kerin Ransom, PA-C  [place], [place]  Coletta Memos, [place]
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
## 2020-06-18 LAB — LIPID PANEL
Chol/HDL Ratio: 2.9 ratio (ref 0.0–4.4)
Cholesterol, Total: 135 mg/dL (ref 100–199)
HDL: 47 mg/dL (ref >39)
LDL Chol Calc (NIH): 63 mg/dL (ref 0–99)
Triglycerides: 142 mg/dL (ref 0–149)
VLDL Cholesterol Cal: 25 mg/dL (ref 5–40)

## 2020-06-18 LAB — COMPREHENSIVE METABOLIC PANEL
ALT: 12 IU/L (ref 0–32)
AST: 16 IU/L (ref 0–40)
Albumin/Globulin Ratio: 1.7 (ref 1.2–2.2)
Albumin: 4.2 g/dL (ref 3.8–4.8)
Alkaline Phosphatase: 76 IU/L (ref 44–121)
BUN/Creatinine Ratio: 12 (ref 9–23)
BUN: 28 mg/dL — ABNORMAL HIGH (ref 6–24)
Bilirubin Total: <0.2 mg/dL (ref 0.0–1.2)
CO2: 22 mmol/L (ref 20–29)
Calcium: 9.2 mg/dL (ref 8.7–10.2)
Chloride: 111 mmol/L — ABNORMAL HIGH (ref 96–106)
Creatinine, Ser: 2.4 mg/dL — ABNORMAL HIGH (ref 0.57–1.00)
GFR calc Af Amer: 27 mL/min/{1.73_m2} — ABNORMAL LOW (ref >59)
GFR calc non Af Amer: 23 mL/min/{1.73_m2} — ABNORMAL LOW (ref >59)
Globulin, Total: 2.5 g/dL (ref 1.5–4.5)
Glucose: 112 mg/dL — ABNORMAL HIGH (ref 65–99)
Potassium: 4.6 mmol/L (ref 3.5–5.2)
Sodium: 145 mmol/L — ABNORMAL HIGH (ref 134–144)
Total Protein: 6.7 g/dL (ref 6.0–8.5)

## 2020-06-19 ENCOUNTER — Emergency Department (HOSPITAL_COMMUNITY): Payer: Medicaid Other

## 2020-06-19 ENCOUNTER — Encounter (HOSPITAL_COMMUNITY): Payer: Self-pay | Admitting: *Deleted

## 2020-06-19 ENCOUNTER — Inpatient Hospital Stay (HOSPITAL_COMMUNITY)
Admission: EM | Admit: 2020-06-19 | Discharge: 2020-06-22 | DRG: 065 | Disposition: A | Discharge status: Home / Self Care | Payer: Medicaid Other | Attending: Internal Medicine | Admitting: Internal Medicine

## 2020-06-19 ENCOUNTER — Observation Stay (HOSPITAL_COMMUNITY): Payer: Medicaid Other

## 2020-06-19 ENCOUNTER — Other Ambulatory Visit: Payer: Self-pay

## 2020-06-19 DIAGNOSIS — E1140 Type 2 diabetes mellitus with diabetic neuropathy, unspecified: Secondary | ICD-10-CM | POA: Diagnosis present

## 2020-06-19 DIAGNOSIS — D638 Anemia in other chronic diseases classified elsewhere: Secondary | ICD-10-CM | POA: Diagnosis present

## 2020-06-19 DIAGNOSIS — Z7984 Long term (current) use of oral hypoglycemic drugs: Secondary | ICD-10-CM

## 2020-06-19 DIAGNOSIS — Z880 Allergy status to penicillin: Secondary | ICD-10-CM

## 2020-06-19 DIAGNOSIS — Z888 Allergy status to other drugs, medicaments and biological substances status: Secondary | ICD-10-CM

## 2020-06-19 DIAGNOSIS — E876 Hypokalemia: Secondary | ICD-10-CM | POA: Diagnosis not present

## 2020-06-19 DIAGNOSIS — Z803 Family history of malignant neoplasm of breast: Secondary | ICD-10-CM

## 2020-06-19 DIAGNOSIS — Z7982 Long term (current) use of aspirin: Secondary | ICD-10-CM

## 2020-06-19 DIAGNOSIS — D631 Anemia in chronic kidney disease: Secondary | ICD-10-CM | POA: Diagnosis present

## 2020-06-19 DIAGNOSIS — E7800 Pure hypercholesterolemia, unspecified: Secondary | ICD-10-CM

## 2020-06-19 DIAGNOSIS — J4520 Mild intermittent asthma, uncomplicated: Secondary | ICD-10-CM | POA: Diagnosis not present

## 2020-06-19 DIAGNOSIS — E119 Type 2 diabetes mellitus without complications: Secondary | ICD-10-CM

## 2020-06-19 DIAGNOSIS — E1122 Type 2 diabetes mellitus with diabetic chronic kidney disease: Secondary | ICD-10-CM

## 2020-06-19 DIAGNOSIS — Z862 Personal history of diseases of the blood and blood-forming organs and certain disorders involving the immune mechanism: Secondary | ICD-10-CM | POA: Diagnosis present

## 2020-06-19 DIAGNOSIS — N189 Chronic kidney disease, unspecified: Secondary | ICD-10-CM | POA: Diagnosis present

## 2020-06-19 DIAGNOSIS — K219 Gastro-esophageal reflux disease without esophagitis: Secondary | ICD-10-CM | POA: Diagnosis present

## 2020-06-19 DIAGNOSIS — I129 Hypertensive chronic kidney disease with stage 1 through stage 4 chronic kidney disease, or unspecified chronic kidney disease: Secondary | ICD-10-CM | POA: Diagnosis present

## 2020-06-19 DIAGNOSIS — I10 Essential (primary) hypertension: Secondary | ICD-10-CM | POA: Diagnosis not present

## 2020-06-19 DIAGNOSIS — Z833 Family history of diabetes mellitus: Secondary | ICD-10-CM

## 2020-06-19 DIAGNOSIS — N184 Chronic kidney disease, stage 4 (severe): Secondary | ICD-10-CM | POA: Diagnosis present

## 2020-06-19 DIAGNOSIS — E785 Hyperlipidemia, unspecified: Secondary | ICD-10-CM | POA: Diagnosis present

## 2020-06-19 DIAGNOSIS — I639 Cerebral infarction, unspecified: Principal | ICD-10-CM | POA: Diagnosis present

## 2020-06-19 DIAGNOSIS — H547 Unspecified visual loss: Secondary | ICD-10-CM | POA: Diagnosis present

## 2020-06-19 DIAGNOSIS — R531 Weakness: Secondary | ICD-10-CM | POA: Diagnosis present

## 2020-06-19 DIAGNOSIS — E1169 Type 2 diabetes mellitus with other specified complication: Secondary | ICD-10-CM | POA: Diagnosis present

## 2020-06-19 DIAGNOSIS — Z7902 Long term (current) use of antithrombotics/antiplatelets: Secondary | ICD-10-CM

## 2020-06-19 DIAGNOSIS — R4701 Aphasia: Secondary | ICD-10-CM | POA: Diagnosis present

## 2020-06-19 DIAGNOSIS — Z8249 Family history of ischemic heart disease and other diseases of the circulatory system: Secondary | ICD-10-CM

## 2020-06-19 DIAGNOSIS — E1159 Type 2 diabetes mellitus with other circulatory complications: Secondary | ICD-10-CM | POA: Diagnosis present

## 2020-06-19 DIAGNOSIS — Z8673 Personal history of transient ischemic attack (TIA), and cerebral infarction without residual deficits: Secondary | ICD-10-CM

## 2020-06-19 DIAGNOSIS — W010XXA Fall on same level from slipping, tripping and stumbling without subsequent striking against object, initial encounter: Secondary | ICD-10-CM | POA: Diagnosis present

## 2020-06-19 DIAGNOSIS — Z20822 Contact with and (suspected) exposure to covid-19: Secondary | ICD-10-CM | POA: Diagnosis present

## 2020-06-19 LAB — APTT: aPTT: 28 seconds (ref 24–36)

## 2020-06-19 LAB — I-STAT CHEM 8, ED
BUN: 38 mg/dL — ABNORMAL HIGH (ref 6–20)
Calcium, Ion: 1.22 mmol/L (ref 1.15–1.40)
Chloride: 112 mmol/L — ABNORMAL HIGH (ref 98–111)
Creatinine, Ser: 2.5 mg/dL — ABNORMAL HIGH (ref 0.44–1.00)
Glucose, Bld: 131 mg/dL — ABNORMAL HIGH (ref 70–99)
HCT: 32 % — ABNORMAL LOW (ref 36.0–46.0)
Hemoglobin: 10.9 g/dL — ABNORMAL LOW (ref 12.0–15.0)
Potassium: 4 mmol/L (ref 3.5–5.1)
Sodium: 146 mmol/L — ABNORMAL HIGH (ref 135–145)
TCO2: 23 mmol/L (ref 22–32)

## 2020-06-19 LAB — COMPREHENSIVE METABOLIC PANEL
ALT: 16 U/L (ref 0–44)
AST: 18 U/L (ref 15–41)
Albumin: 3.8 g/dL (ref 3.5–5.0)
Alkaline Phosphatase: 58 U/L (ref 38–126)
Anion gap: 12 (ref 5–15)
BUN: 35 mg/dL — ABNORMAL HIGH (ref 6–20)
CO2: 20 mmol/L — ABNORMAL LOW (ref 22–32)
Calcium: 9.3 mg/dL (ref 8.9–10.3)
Chloride: 111 mmol/L (ref 98–111)
Creatinine, Ser: 2.59 mg/dL — ABNORMAL HIGH (ref 0.44–1.00)
GFR, Estimated: 22 mL/min — ABNORMAL LOW (ref >60)
Glucose, Bld: 137 mg/dL — ABNORMAL HIGH (ref 70–99)
Potassium: 4 mmol/L (ref 3.5–5.1)
Sodium: 143 mmol/L (ref 135–145)
Total Bilirubin: 0.5 mg/dL (ref 0.3–1.2)
Total Protein: 7.1 g/dL (ref 6.5–8.1)

## 2020-06-19 LAB — DIFFERENTIAL
Abs Immature Granulocytes: 0.01 10*3/uL (ref 0.00–0.07)
Basophils Absolute: 0.1 10*3/uL (ref 0.0–0.1)
Basophils Relative: 1 %
Eosinophils Absolute: 0.1 10*3/uL (ref 0.0–0.5)
Eosinophils Relative: 2 %
Immature Granulocytes: 0 %
Lymphocytes Relative: 44 %
Lymphs Abs: 2.9 10*3/uL (ref 0.7–4.0)
Monocytes Absolute: 0.3 10*3/uL (ref 0.1–1.0)
Monocytes Relative: 5 %
Neutro Abs: 3.3 10*3/uL (ref 1.7–7.7)
Neutrophils Relative %: 48 %

## 2020-06-19 LAB — CBC
HCT: 31.5 % — ABNORMAL LOW (ref 36.0–46.0)
Hemoglobin: 10.5 g/dL — ABNORMAL LOW (ref 12.0–15.0)
MCH: 29.7 pg (ref 26.0–34.0)
MCHC: 33.3 g/dL (ref 30.0–36.0)
MCV: 89.2 fL (ref 80.0–100.0)
Platelets: 257 10*3/uL (ref 150–400)
RBC: 3.53 MIL/uL — ABNORMAL LOW (ref 3.87–5.11)
RDW: 12.9 % (ref 11.5–15.5)
WBC: 6.7 10*3/uL (ref 4.0–10.5)
nRBC: 0 % (ref 0.0–0.2)

## 2020-06-19 LAB — RESP PANEL BY RT-PCR (FLU A&B, COVID) ARPGX2
Influenza A by PCR: NEGATIVE
Influenza B by PCR: NEGATIVE
SARS Coronavirus 2 by RT PCR: NEGATIVE

## 2020-06-19 LAB — PROTIME-INR
INR: 1 (ref 0.8–1.2)
Prothrombin Time: 12.9 seconds (ref 11.4–15.2)

## 2020-06-19 LAB — TROPONIN I (HIGH SENSITIVITY): Troponin I (High Sensitivity): 7 ng/L (ref <18)

## 2020-06-19 LAB — I-STAT BETA HCG BLOOD, ED (MC, WL, AP ONLY): I-stat hCG, quantitative: <5 m[IU]/mL (ref <5)

## 2020-06-19 LAB — CBG MONITORING, ED
Glucose-Capillary: 108 mg/dL — ABNORMAL HIGH (ref 70–99)
Glucose-Capillary: 91 mg/dL (ref 70–99)

## 2020-06-19 LAB — CK: Total CK: 153 U/L (ref 38–234)

## 2020-06-19 LAB — HEMOGLOBIN A1C
Hgb A1c MFr Bld: 6.2 % — ABNORMAL HIGH (ref 4.8–5.6)
Mean Plasma Glucose: 131.24 mg/dL

## 2020-06-19 MED ORDER — LABETALOL HCL 5 MG/ML IV SOLN
10.0000 mg | Freq: Once | INTRAVENOUS | Status: AC
  Administered 2020-06-19: 10 mg via INTRAVENOUS
  Filled 2020-06-19: qty 4

## 2020-06-19 MED ORDER — SODIUM CHLORIDE 0.9% FLUSH
3.0000 mL | Freq: Once | INTRAVENOUS | Status: DC
Start: 2020-06-19 — End: 2020-06-22

## 2020-06-19 MED ORDER — LORAZEPAM 2 MG/ML IJ SOLN: 2.0000 mg | Freq: Once | INTRAMUSCULAR | Status: DC | PRN

## 2020-06-19 MED ORDER — LORAZEPAM 2 MG/ML IJ SOLN
INTRAMUSCULAR | Status: AC
  Filled 2020-06-19: qty 1

## 2020-06-19 MED ORDER — INSULIN ASPART 100 UNIT/ML ~~LOC~~ SOLN
0.0000 [IU] | SUBCUTANEOUS | Status: DC
  Administered 2020-06-20 – 2020-06-21 (×3): 1 [IU] via SUBCUTANEOUS

## 2020-06-19 NOTE — ED Notes (Signed)
The pt reports that her lt eye does not react as well as the rt  Diabetic retinopathy

## 2020-06-19 NOTE — Consult Note (Signed)
NEUROLOGY CONSULTATION NOTE   Date of service: June 19, 2020 Patient Name: Christina Orozco MRN:  [id number] DOB:  09-18-1970 Reason for consult: "Stroke code" _ _ _   _ __   _ __ _ _  __ __   _ __   __ _  History of Present Illness  Christina Orozco is a 50 y.o. female with hx of prior lacunar strokes, DM2, HTN, GERD, neuropathy who presents with acute onset R arm and leg weakness with aphasia. Symptoms started after she fell at 1700 on 06/20/19.  Noted on exam to be moving her RUE when no one is looking. She is able to phonate but not articulate, unable to move her lips at all and RUE and RLE is flaccid. Also of note, SBP was elevated to 240s on presentation. STAT CTH was negative. Given concern for functional neuro deficit, a STAT limited MRI Brain was obtained and was notable for a subacute pontine infarct on DWI imaging but no ADC correlate.  MRS: 0 LKW: 1700 on 06/20/19 TPA: not offered, functional symptoms and low concern for an organic cause. MRI confirms that her stroke is pontine and subacute. Thrombectomy: Not pursued, lacunar pontine stroke is unlikely to be due to an LVO.   ROS  Unable to obtain any review of systems as patient is unable to articulate.  She seems to be able to phonate.  Denies any pain. Past History   Past Medical History:  Diagnosis Date  . Anemia    severe  . Asthma   . CKD (chronic kidney disease)    stage IV  . DDD (degenerative disc disease), cervical   . Diabetes mellitus ([place])   . Dislocated shoulder    right  . GERD (gastroesophageal reflux disease)   . Hypertension   . Neuropathy   . Stroke [facility])    Past Surgical History:  Procedure Laterality Date  . ADENOIDECTOMY    . APPENDECTOMY    . CERVICAL SPINE SURGERY  06/2012   C5-C6  . EYE SURGERY     left eye surgery   . FOOT SURGERY    . LOOP RECORDER INSERTION N/A 08/27/2017   Procedure: LOOP RECORDER INSERTION;  Surgeon: Sanda Klein, MD;  Location: [place] CV LAB;  Service:  Cardiovascular;  Laterality: N/A;  . SHOULDER SURGERY    . TEE WITHOUT CARDIOVERSION N/A 08/27/2017   Procedure: TRANSESOPHAGEAL ECHOCARDIOGRAM (TEE);  Surgeon: Sanda Klein, MD;  Location: [facility] ENDOSCOPY;  Service: Cardiovascular;  Laterality: N/A;  . TONSILLECTOMY     Family History  Problem Relation Age of Onset  . Vascular Disease Mother   . CAD Mother   . Heart failure Mother   . Heart disease Other   . Cancer Other        colon, stomach, pancreatic, lung  . Diabetes Other   . Breast cancer Sister 35  . Seizures Maternal Uncle    Social History   Socioeconomic History  . Marital status: Single    Spouse name: Not on file  . Number of children: 3  . Years of education: 12+  . Highest education level: Some college, no degree  Occupational History  . Not on file  Tobacco Use  . Smoking status: Never Smoker  . Smokeless tobacco: Never Used  Vaping Use  . Vaping Use: Never used  Substance and Sexual Activity  . Alcohol use: No  . Drug use: No  . Sexual activity: Not on file  Other Topics Concern  .  Not on file  Social History Narrative   Lives with daughter   Caffeine use: "once in a blue moon"   Right handed   Social Determinants of Health   Financial Resource Strain: Not on file  Food Insecurity: Not on file  Transportation Needs: Not on file  Physical Activity: Not on file  Stress: Not on file  Social Connections: Not on file   Allergies  Allergen Reactions  . Lisinopril Swelling and Other (See Comments)    Facial swelling   . Penicillins Hives    Has patient had a PCN reaction causing immediate rash, facial/tongue/throat swelling, SOB or lightheadedness with hypotension: yes Has patient had a PCN reaction causing severe rash involving mucus membranes or skin necrosis: No Has patient had a PCN reaction that required hospitalization No Has patient had a PCN reaction occurring within the last 10 years: No If all of the above answers are "NO", then may  proceed wit  . Gabapentin Swelling and Other (See Comments)    "I thought I was having another stroke."     Medications  (Not in a hospital admission)    Vitals   Vitals:   06/19/20 1850 06/19/20 1850 06/19/20 1900 06/19/20 1913  BP: (!) 240/110  (!) 150/83   Pulse: 70 80 75   Resp: 18  20   Temp:      SpO2: 100%  100%   Weight:    69.9 kg  Height:    5\' 2"  (1.575 m)     Body mass index is 28.19 kg/m.  Physical Exam   General: Laying comfortably in bed; in no acute distress.  HENT: Normal oropharynx and mucosa. Normal external appearance of ears and nose.  Neck: Supple, no pain or tenderness  CV: No JVD. No peripheral edema.  Pulmonary: Symmetric Chest rise. Normal respiratory effort.  Abdomen: Soft to touch, non-tender.  Ext: No cyanosis, edema, or deformity  Skin: No rash. Normal palpation of skin.   Musculoskeletal: Normal digits and nails by inspection. No clubbing.   Neurologic Examination  Mental status/Cognition: Alert, oriented to self, place, good attention Speech/language: Can phonate but not articulate, comprehension intact to simple commands, able to identify simple objects, unable to repeat.  Cranial nerves:   CN II Pupils equal and reactive to light, no VF deficits    CN III,IV,VI EOM intact, no gaze preference or deviation, no nystagmus    CN V normal sensation in V1, V2, and V3 segments bilaterally    CN VII ?mild R nasolabial fold droop.   CN VIII normal hearing to speech    CN IX & X normal palatal elevation, no uvular deviation    CN XI 5/5 head turn and 5/5 shoulder shrug bilaterally   CN XII midline tongue protrusion   Motor:  Muscle bulk: normal, tone increased extensor tone in RUE and RLE.  Hand falls on her side instead of her face. Positive hoover's sign. Mvmt Root Nerve  Muscle Right Left Comments  SA C5/6 Ax Deltoid 0 5   EF C5/6 Mc Biceps 0 5   EE C6/7/8 Rad Triceps 0 5   WF C6/7 Med FCR 0 5   WE C7/8 PIN ECU 0 5   F Ab C8/T1 U  ADM/FDI 1 5   HF L1/2/3 Fem Illopsoas 0 5   KE L2/3/4 Fem Quad 0 5   DF L4/5 D Peron Tib Ant 0 5   PF S1/2 Tibial Grc/Sol 1 5    Reflexes:  Right Left Comments  Pectoralis      Biceps (C5/6) 2 2   Brachioradialis (C5/6) 2 2    Triceps (C6/7) 2 2    Patellar (L3/4) 2 2    Achilles (S1)      Hoffman      Plantar     Jaw jerk    Sensation:  Light touch Intact throughout   Pin prick    Temperature    Vibration   Proprioception    Coordination/Complex Motor:  - Finger to Nose intact on the left. - Heel to shin unable to do. - Rapid alternating movement are normal on the left. - Gait: deferred.  Labs   CBC:  Recent Labs  Lab 06/19/20 1755 06/19/20 1756  WBC  --  6.7  NEUTROABS  --  3.3  HGB 10.9* 10.5*  HCT 32.0* 31.5*  MCV  --  89.2  PLT  --  604    Basic Metabolic Panel:  Lab Results  Component Value Date   NA 143 06/19/2020   K 4.0 06/19/2020   CO2 20 (L) 06/19/2020   GLUCOSE 137 (H) 06/19/2020   BUN 35 (H) 06/19/2020   CREATININE 2.59 (H) 06/19/2020   CALCIUM 9.3 06/19/2020   GFRNONAA 22 (L) 06/19/2020   GFRAA 27 (L) 06/17/2020   Lipid Panel:  Lab Results  Component Value Date   LDLCALC 63 06/17/2020   HgbA1c:  Lab Results  Component Value Date   HGBA1C 6.3 (A) 07/15/2018   Urine Drug Screen:     Component Value Date/Time   LABOPIA NONE DETECTED 03/25/2018 1919   COCAINSCRNUR NONE DETECTED 03/25/2018 1919   LABBENZ NONE DETECTED 03/25/2018 1919   AMPHETMU NONE DETECTED 03/25/2018 1919   THCU NONE DETECTED 03/25/2018 1919   LABBARB NONE DETECTED 03/25/2018 1919    Alcohol Level No results found for: ETH  CT Head without contrast: CTH was negative for a large hypodensity concerning for a large territory infarct or hyperdensity concerning for an ICH   MRI Brain  L pontine subacute stroke   Impression   Xinyi Batton is a 50 y.o. female with poorly controlled hypertension, diabetes presenting with sudden onset right arm and leg  weakness with inability to phonate but articulate words.  Exam notable for functional right-sided weakness with hand falling to her side and side of her face on exam and positive Hoover signs.  She was also noted to be moving her right upper extremity when no one was watching her.  Although she was within tPA window at presentation, given her significantly elevated blood pressure to 240s along with high suspicion for functional presentation, decision was made to get an MRI brain without contrast before giving tPA.  MRI brain without contrast demonstrates a left pontine subacute stroke but no acute stroke that could explain her presentation.  tPA was not offered thrombectomy work-up was not pursued as pontine infarct is is likely due to small vessel disease rather than a large vessel occlusion. Recommendations  Plan:  - Frequent Neuro checks per stroke unit protocol - Recommend Vascular imaging with MRA Angio Head without contrast and US Carotid doppler - Recommend obtaining TTE - Recommend obtaining Lipid panel with LDL - Please start statin if LDL > 70 - Recommend HbA1c - Antithrombotic - Asprin 81mg  daily and plavix 75mg  daily x 21 days, then aspirin 81mg  daily alone. - Recommend DVT ppx - SBP goal - aim for gradual normotension, she is outside the window for permissive HTN. -  Recommend Telemetry monitoring for arrythmia - Recommend bedside swallow screen prior to PO intake. - Stroke education booklet - Recommend PT/OT/SLP consult - Recommend Urine Tox screen.  ______________________________________________________________________  This patient is critically ill and at significant risk of neurological worsening, death and care requires constant monitoring of vital signs, hemodynamics,respiratory and cardiac monitoring, neurological assessment, discussion with family, other specialists and medical decision making of high complexity. I spent 60 minutes of neurocritical care time  in the care  of  this patient. This was time spent independent of any time provided by nurse practitioner or PA.  Donnetta Simpers [facility] Pager Number [phone] 06/19/2020  7:37 PM   Thank you for the opportunity to take part in the care of this patient. If you have any further questions, please contact the neurology consultation attending.  Signed,  [place] Pager Number [phone] _ _ _   _ __   _ __ _ _  __ __   _ __   __ _

## 2020-06-19 NOTE — H&P (Signed)
Christina Orozco XIP:[id number] DOB: 08-28-1970 DOA: 06/19/2020     PCP: Rikki Spearing, NP   Outpatient Specialists:  CARDS:    [facility]   Dr.Feraru    Patient arrived to ER on 06/19/20 at 1747 Referred by Attending Horton, Alvin Critchley, DO   Patient coming from: home Lives   With family   Chief Complaint:   Chief Complaint  Patient presents with  . Code Stroke    HPI: Christina Orozco is a 50 y.o. female with medical history significant of hypertension, severe LVH, diabetes, recurrent stroke, seizures, CKD IV, and asthma      Presented with fall with right-sided weakness around 5:30 PM. As well as aphasia.\ On EMS arrival blood pressure was 240/110 CBG 172 last time seen normal was 5 PM. Patient states they went out to get some fast food and on the way back when she got out of her car she started to stumble and fell down. Patient has had a lot of stressors lately in particular with her neighbor. Patient thought she is about to have a stroke prior to this happening.  Patient was given labetalol 10 mg IV blood pressure improved to 165/85 Initially code stroke was   Initiated patient was seen by neurology Felt to have functional right-sided weakness She was noted to be able to move her right upper extremity. 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 Patient felt to be not a candidate for TPA or thrombectomy  Infectious risk factors:  Reports none    Has  NOt been vaccinated against COVID refuses   Initial COVID TEST  NEGATIVE   Lab Results  Component Value Date   [place] NEGATIVE 06/19/2020    Regarding pertinent Chronic problems:    Hyperlipidemia - on statins LIpitor Lipid Panel     Component Value Date/Time   CHOL 135 06/17/2020 1053   TRIG 142 06/17/2020 1053   HDL 47 06/17/2020 1053   CHOLHDL 2.9 06/17/2020 1053   CHOLHDL 2.4 07/26/2017 1118   VLDL 14 07/26/2017 1118   LDLCALC 63 06/17/2020 1053   LABVLDL 25 06/17/2020 1053     HTN  since High School on  Carvedilol was increased, amlodipine and doxazosin  US renal artery Duplex done in 2019  No ultrasound evidence of hemodynamically significant renal artery stenosis   chronic CHF diastolic/systolic/ combined - last [id number] She had an echo 04/2016 that revealed LVEF 65-70% and mild basal septum hypertrophy.  She had a repeat echo 12/01/16 that showed LVEF 65-70% and severe LVH.  DM 2 -  Lab Results  Component Value Date   HGBA1C 6.3 (A) 07/15/2018   on  PO meds only,         Asthma -well   controlled on home inhalers/ nebs f                           Hx of CVA -admitted 07/2017 stroke of the left paramedian pons. Carotid Dopplers revealed 1-39% ICA stenosis and echo was unchanged from 11/2016.  She was scheduled for TEE 08/2017 that revealed no PFO.  A loop recorder was placed at that time.  The loop recorder was removed 07/2019.   admitted 02/2019 after recurrent stroke    With residual deficits on Aspirin 81 mg,   Plavix Lipitor   CKD stage VI- baseline Cr 2.3 Estimated Creatinine Clearance: 24.6 mL/min (A) (by C-G formula based on SCr of 2.59 mg/dL (H)).  Lab Results  Component Value Date   CREATININE 2.59 (H) 06/19/2020   CREATININE 2.50 (H) 06/19/2020   CREATININE 2.40 (H) 06/17/2020    Chronic anemia - baseline hg Hemoglobin & Hematocrit  Recent Labs    06/09/20 1910 06/19/20 1755 06/19/20 1756  HGB 9.0* 10.9* 10.5*     While in ER: Initially code stroke initiated CTA and MRI done showing subacute pontine infarct Symptoms mild Not corresponding to imaging No TPA was given   ER Provider Called: Neurology    Dr. Lorrin Goodell   They Recommend admit to medicine    Will seen  ER  Hospitalist was called for admission for Subacute pontine CVA  The following Work up has been ordered so far:  Orders Placed This Encounter  Procedures  . Resp Panel by RT-PCR (Flu A&B, Covid) Nasopharyngeal Swab  . CT HEAD CODE STROKE WO CONTRAST  . MR BRAIN WO  CONTRAST  . Protime-INR  . APTT  . CBC  . Differential  . Comprehensive metabolic panel  . Diet NPO time specified  . Cardiac monitoring  . Stroke swallow screen  . NIH Stroke Scale  . Modified Stroke Scale (mNIHSS) Document mNIHSS assessment every 2 hours for a total of 12 hours  . Saline Lock IV, Maintain IV access  . If O2 Sat <94% administer O2 at 2 liters/minute via nasal cannula  . Consult for [place] Admission  ALL PATIENTS BEING ADMITTED/HAVING PROCEDURES NEED COVID-19 SCREENING  . Airborne and Contact precautions  . Pulse oximetry, continuous  . Adult wheeze protocol - initiate by RT  . I-stat chem 8, ED  . CBG monitoring, ED  . I-Stat beta hCG blood, ED  . ED EKG  . EKG 12-Lead     Following Medications were ordered in ER: Medications  sodium chloride flush (NS) 0.9 % injection 3 mL (has no administration in time range)  LORazepam (ATIVAN) injection 2 mg (has no administration in time range)  labetalol (NORMODYNE) injection 10 mg (10 mg Intravenous Given 06/19/20 1917)    Significant initial  Findings: Abnormal Labs Reviewed  CBC - Abnormal; Notable for the following components:      Result Value   RBC 3.53 (*)    Hemoglobin 10.5 (*)    HCT 31.5 (*)    All other components within normal limits  COMPREHENSIVE METABOLIC PANEL - Abnormal; Notable for the following components:   CO2 20 (*)    Glucose, Bld 137 (*)    BUN 35 (*)    Creatinine, Ser 2.59 (*)    GFR, Estimated 22 (*)    All other components within normal limits  I-STAT CHEM 8, ED - Abnormal; Notable for the following components:   Sodium 146 (*)    Chloride 112 (*)    BUN 38 (*)    Creatinine, Ser 2.50 (*)    Glucose, Bld 131 (*)    Hemoglobin 10.9 (*)    HCT 32.0 (*)    All other components within normal limits  CBG MONITORING, ED - Abnormal; Notable for the following components:   Glucose-Capillary 108 (*)    All other components within normal limits    Otherwise labs showing:   Recent Labs  Lab 06/17/20 1053 06/19/20 1755 06/19/20 1756  NA 145* 146* 143  K 4.6 4.0 4.0  CO2 22  --  20*  GLUCOSE 112* 131* 137*  BUN 28* 38* 35*  CREATININE 2.40* 2.50* 2.59*  CALCIUM 9.2  --  9.3  Cr   Stable,  Lab Results  Component Value Date   CREATININE 2.59 (H) 06/19/2020   CREATININE 2.50 (H) 06/19/2020   CREATININE 2.40 (H) 06/17/2020    Recent Labs  Lab 06/17/20 1053 06/19/20 1756  AST 16 18  ALT 12 16  ALKPHOS 76 58  BILITOT <0.2 0.5  PROT 6.7 7.1  ALBUMIN 4.2 3.8   Lab Results  Component Value Date   CALCIUM 9.3 06/19/2020   PHOS 3.5 03/28/2018        WBC      Component Value Date/Time   WBC 6.7 06/19/2020 1756   LYMPHSABS 2.9 06/19/2020 1756   LYMPHSABS 2.7 08/20/2017 1503   MONOABS 0.3 06/19/2020 1756   EOSABS 0.1 06/19/2020 1756   EOSABS 0.2 08/20/2017 1503   BASOSABS 0.1 06/19/2020 1756   BASOSABS 0.0 08/20/2017 1503    Plt: Lab Results  Component Value Date   PLT 257 06/19/2020    Lactic Acid, Venous    Component Value Date/Time   LATICACIDVEN 2.56 (HH) 03/25/2018 2306       HG/HCT   Stable     Component Value Date/Time   HGB 10.5 (L) 06/19/2020 1756   HGB 9.8 (L) 08/20/2017 1503   HCT 31.5 (L) 06/19/2020 1756   HCT 29.2 (L) 08/20/2017 1503   MCV 89.2 06/19/2020 1756   MCV 84 08/20/2017 1503    No results for input(s): LIPASE, AMYLASE in the last 168 hours. No results for input(s): AMMONIA in the last 168 hours.    Troponin   ordered     ECG: Ordered Personally reviewed by me showing: HR : 80 Rhythm:  NSR    no evidence of ischemic changes QTC 475    DM  labs:  HbA1C: Recent Labs    06/19/20 2145  HGBA1C 6.2*       CBG (last 3)  Recent Labs    06/19/20 1854  GLUCAP 108*     UA   ordered    Ordered  CT HEAD NON acute  CXR - NON acute   MRI - Subcentimeter likely subacute infarct at the left lateral aspect of the pons     ED Triage Vitals  Enc Vitals Group     BP 06/19/20 1845 (!)  156/80     Pulse Rate 06/19/20 1850 70     Resp 06/19/20 1850 18     Temp 06/19/20 1840 98.2 F (36.8 C)     Temp Source 06/19/20 2007 Oral     SpO2 06/19/20 1850 100 %     Weight 06/19/20 1913 154 lb 1.6 oz (69.9 kg)     Height 06/19/20 1913 5\' 2"  (1.575 m)     Head Circumference --      Peak Flow --      Pain Score 06/19/20 1904 0     Pain Loc --      Pain Edu? --      Excl. in [place]? --   TMAX(24)@       Latest  Blood pressure (!) 170/86, pulse 77, temperature 98.3 F (36.8 C), temperature source Oral, resp. rate 14, height 5\' 2"  (1.575 m), weight 69.9 kg, last menstrual period 05/22/2018, SpO2 100 %.     Review of Systems:    Pertinent positives include:  localizing neurological complaints gait abnormality,   slurred speech, Constitutional:  No weight loss, night sweats, Fevers, chills, fatigue, weight loss  HEENT:  No headaches, Difficulty swallowing,Tooth/dental problems,Sore throat,  No  sneezing, itching, ear ache, nasal congestion, post nasal drip,  Cardio-vascular:  No chest pain, Orthopnea, PND, anasarca, dizziness, palpitations.no Bilateral lower extremity swelling  GI:  No heartburn, indigestion, abdominal pain, nausea, vomiting, diarrhea, change in bowel habits, loss of appetite, melena, blood in stool, hematemesis Resp:  no shortness of breath at rest. No dyspnea on exertion, No excess mucus, no productive cough, No non-productive cough, No coughing up of blood.No change in color of mucus.No wheezing. Skin:  no rash or lesions. No jaundice GU:  no dysuria, change in color of urine, no urgency or frequency. No straining to urinate.  No flank pain.  Musculoskeletal:  No joint pain or no joint swelling. No decreased range of motion. No back pain.  Psych:  No change in mood or affect. No depression or anxiety. No memory loss.   Neuro: no, no tingling, no weakness, no double vision, no  no confusion  All systems reviewed and apart from [place] all are  negative  Past Medical History:   Past Medical History:  Diagnosis Date  . Anemia    severe  . Asthma   . CKD (chronic kidney disease)    stage IV  . DDD (degenerative disc disease), cervical   . Diabetes mellitus ([place])   . Dislocated shoulder    right  . GERD (gastroesophageal reflux disease)   . Hypertension   . Neuropathy   . Stroke [facility])      Past Surgical History:  Procedure Laterality Date  . ADENOIDECTOMY    . APPENDECTOMY    . CERVICAL SPINE SURGERY  06/2012   C5-C6  . EYE SURGERY     left eye surgery   . FOOT SURGERY    . LOOP RECORDER INSERTION N/A 08/27/2017   Procedure: LOOP RECORDER INSERTION;  Surgeon: Sanda Klein, MD;  Location: [place] [facility];  Service: Cardiovascular;  Laterality: N/A;  . SHOULDER SURGERY    . TEE WITHOUT CARDIOVERSION N/A 08/27/2017   Procedure: TRANSESOPHAGEAL ECHOCARDIOGRAM (TEE);  Surgeon: Sanda Klein, MD;  Location: [facility] ENDOSCOPY;  Service: Cardiovascular;  Laterality: N/A;  . TONSILLECTOMY      Social History:  Ambulatory  Independently     reports that she has never smoked. She has never used smokeless tobacco. She reports that she does not drink alcohol and does not use drugs.   Family History:  Family History  Problem Relation Age of Onset  . Vascular Disease Mother   . CAD Mother   . Heart failure Mother   . Heart disease Other   . Cancer Other        colon, stomach, pancreatic, lung  . Diabetes Other   . Breast cancer Sister 21  . Seizures Maternal Uncle     Allergies: Allergies  Allergen Reactions  . Lisinopril Swelling and Other (See Comments)    Facial swelling   . Penicillins Hives    Has patient had a PCN reaction causing immediate rash, facial/tongue/throat swelling, SOB or lightheadedness with hypotension: yes Has patient had a PCN reaction causing severe rash involving mucus membranes or skin necrosis: No Has patient had a PCN reaction that required hospitalization No Has patient had a PCN  reaction occurring within the last 10 years: No If all of the above answers are "NO", then may proceed wit  . Gabapentin Swelling and Other (See Comments)    "I thought I was having another stroke."      Prior to Admission medications   Medication Sig Start Date  End Date Taking? Authorizing Provider  albuterol (PROAIR HFA) 108 (90 Base) MCG/ACT inhaler INHALE 1 OR 2 PUFFS INTO THE LUNGS EVERY 6 HOURS AS NEEDED FOR WHEEZING OR SHORTNESS OF BREATH 04/12/18   Angiulli, Lavon Paganini, PA-C  amLODipine (NORVASC) 10 MG tablet TAKE 1 TABLET BY MOUTH EVERY DAY 09/08/19   Erlene Quan, PA-C  aspirin 81 MG EC tablet Take by mouth.    [provider]  atorvastatin (LIPITOR) 80 MG tablet Take 80 mg by mouth daily. 02/25/20   [provider]  carvedilol (COREG) 25 MG tablet Take 1 tablet (25 mg total) by mouth 2 (two) times daily with a meal. Take 25 mg twice daily 04/12/18   Angiulli, Lavon Paganini, PA-C  Cholecalciferol (VITAMIN D3 PO) Take 2,000 mg by mouth daily.    [provider]  clopidogrel (PLAVIX) 75 MG tablet Take 1 tablet (75 mg total) by mouth daily. 04/12/18   Angiulli, Lavon Paganini, PA-C  doxazosin (CARDURA) 4 MG tablet TAKE 1 TABLET BY MOUTH EVERY DAY 09/08/19   Croitoru, Mihai, MD  DULoxetine (CYMBALTA) 60 MG capsule Take 60 mg by mouth daily.    [provider]  Eszopiclone 3 MG TABS Take 3 mg by mouth at bedtime. Take immediately before bedtime    [provider]  ferrous sulfate 325 (65 FE) MG tablet Take 1 tablet (325 mg total) by mouth daily. 04/12/18 04/06/20  Angiulli, Lavon Paganini, PA-C  furosemide (LASIX) 20 MG tablet Take 20 mg by mouth as directed. TAKE 1/2 TABLET DAILY. OK TO TAKE FULL TABLET AS NEEDED FOR SWELLING OR SHORTNESS OF BREATH 11/20/19   [provider]  glucose blood (CONTOUR NEXT TEST) test strip 1 each by Other route 2 (two) times daily. And lancets 2/day 04/12/18   Angiulli, Lavon Paganini, PA-C  hydrALAZINE (APRESOLINE) 25 MG tablet Take 25  mg by mouth every 8 (eight) hours as needed. FOR BLOOD PRESSURE ABOVE 150    [provider]  levETIRAcetam (KEPPRA) 500 MG tablet TAKE 1 TABLET(500 MG) BY MOUTH TWICE DAILY 07/15/18   Lanae Boast, FNP  meclizine (ANTIVERT) 25 MG tablet TAKE 1 TABLET(25 MG) BY MOUTH THREE TIMES DAILY AS NEEDED FOR DIZZINESS 06/10/18   Lanae Boast, FNP  pantoprazole (PROTONIX) 40 MG tablet TAKE 1 TABLET(40 MG) BY MOUTH DAILY 04/23/19   Tresa Garter, MD  pregabalin (LYRICA) 50 MG capsule Take 1 capsule by mouth at bedtime. 04/23/20   [provider]  Semaglutide,0.25 or 0.5MG /DOS, (OZEMPIC, 0.25 OR 0.5 MG/DOSE,) 2 MG/1.5ML SOPN INJECT 0.5 MG(0.4 ML) INTO THE SKIN ONCE A WEEK 02/27/20   [provider]  tiZANidine (ZANAFLEX) 2 MG tablet Take 1/2 to 1 tablet every 8 hours as needed for neck spasm or cramping. 04/30/20   [provider]  topiramate (TOPAMAX) 25 MG tablet Start 1 hs, increase by 1 weekly to 4 hs 05/12/20   [provider]   Physical Exam: Vitals with BMI 06/19/2020 06/19/2020 06/19/2020  Height - - -  Weight - - -  BMI - - -  Systolic 947 - 096  Diastolic 86 - 82  Pulse 77 76 83    1. General:  in No  Acute distress    Chronically ill  -appearing 2. Psychological: Alert and   Oriented 3. Head/ENT:     Dry Mucous Membranes  Head Non traumatic, neck supple                            Poor Dentition 4. SKIN:   decreased Skin turgor,  Skin clean Dry and intact no rash 5. Heart: Regular rate and rhythm no Murmur, no Rub or gallop 6. Lungs:   no wheezes or crackles   7. Abdomen: Soft,  non-tender, Non distended  bowel sounds present 8. Lower extremities: no clubbing, cyanosis, no edema 9. Neurologically   Diminished on the right but not consistently  out of 5 in all 4 extremities cranial nerves II through XII intact 10. MSK: Normal range of motion  All other LABS:     Recent Labs  Lab 06/19/20 1755 06/19/20 1756  WBC   --  6.7  NEUTROABS  --  3.3  HGB 10.9* 10.5*  HCT 32.0* 31.5*  MCV  --  89.2  PLT  --  257     Recent Labs  Lab 06/17/20 1053 06/19/20 1755 06/19/20 1756  NA 145* 146* 143  K 4.6 4.0 4.0  CL 111* 112* 111  CO2 22  --  20*  GLUCOSE 112* 131* 137*  BUN 28* 38* 35*  CREATININE 2.40* 2.50* 2.59*  CALCIUM 9.2  --  9.3     Recent Labs  Lab 06/17/20 1053 06/19/20 1756  AST 16 18  ALT 12 16  ALKPHOS 76 58  BILITOT <0.2 0.5  PROT 6.7 7.1  ALBUMIN 4.2 3.8   Cultures:    Component Value Date/Time   SDES URINE, CLEAN CATCH 06/21/2019 0629   SPECREQUEST  06/21/2019 0629    NONE Performed at [facility], [place] [address] [place], [place] [address]    CULT >=100,000 COLONIES/mL ESCHERICHIA COLI (A) 06/21/2019 0629   REPTSTATUS 06/23/2019 FINAL 06/21/2019 0629     Radiological Exams on Admission: MR BRAIN WO CONTRAST  Result Date: 06/19/2020 CLINICAL DATA:  Code stroke follow-up EXAM: MRI HEAD WITHOUT CONTRAST TECHNIQUE: Multiplanar, multiecho pulse sequences of the brain and surrounding structures were obtained without intravenous contrast. COMPARISON:  05/01/2020 FINDINGS: DWI, SWAN, axial T2 FLAIR, and sagittal T1 sequences were obtained. Additional sequences were not performed at the request of ordering physician. Brain: Small subcentimeter focus of diffusion hyperintensity at the left lateral aspect of the pons with corresponding ADC isointensity. Patchy and confluent areas of T2 hyperintensity in the supratentorial and pontine white matter are nonspecific but probably reflect stable chronic microvascular ischemic changes. There are chronic small vessel infarcts of the thalamus bilaterally, white matter adjacent to the right frontal horn, and right cerebellum. Multiple foci of susceptibility hypointensity are present with involvement of the pons, medulla, thalamus, basal ganglia, and cerebral white matter consistent with chronic microhemorrhages likely secondary to  hypertension. Ventricles are stable in size. There is no intracranial mass or mass effect. No extra-axial fluid collection. Vascular: Major vessel flow voids at the skull base are preserved. Skull and upper cervical spine: Normal marrow signal is preserved. Sinuses/Orbits: Paranasal sinuses are aerated. Orbits are unremarkable. Other: Sella is unremarkable. Mastoid air cells are clear. Right frontal scalp lipoma. IMPRESSION: Subcentimeter likely subacute infarct at the left lateral aspect of the pons. Stable chronic findings detailed above including multiple small infarcts, chronic microvascular ischemic changes, and likely hypertensive chronic microhemorrhages. These results were discussed by telephone at the time of interpretation on 06/19/2020 at 6:30 pm with provider [facility] , who verbally acknowledged these results.  Electronically Signed   By: Macy Mis M.D.   On: 06/19/2020 18:47   CT HEAD CODE STROKE WO CONTRAST  Result Date: 06/19/2020 CLINICAL DATA:  Code stroke. EXAM: CT HEAD WITHOUT CONTRAST TECHNIQUE: Contiguous axial images were obtained from the base of the skull through the vertex without intravenous contrast. COMPARISON:  09/17/2019, MRI 05/01/2020 FINDINGS: Brain: No acute intracranial hemorrhage, mass effect, or edema. No new loss of gray-white differentiation. Chronic infarcts of the right frontal white matter adjacent to the frontal horn, bilateral thalamus, and left caudothalamic groove. Ventricles and sulci are normal in size and configuration. There is no extra-axial collection. Vascular: No hyperdense vessel. Minimal intracranial atherosclerotic calcification at the skull base. Skull: Unremarkable. Sinuses/Orbits: No acute finding. Other: Small right frontal scalp lipoma. Mastoid air cells are clear. ASPECTS ([place] Stroke Program Early CT Score) - Ganglionic level infarction (caudate, lentiform nuclei, internal capsule, insula, M1-M3 cortex): 7 - Supraganglionic infarction  (M4-M6 cortex): 3 Total score (0-10 with 10 being normal): 10 IMPRESSION: There is no acute intracranial hemorrhage or evidence of acute infarction. ASPECT score is 10. These results were communicated to Dr. Lorrin Goodell at 6:09 pm on 06/19/2020 by text page via the [facility] messaging system. Electronically Signed   By: Macy Mis M.D.   On: 06/19/2020 18:13    Chart has been reviewed  Assessment/Plan   50 y.o. female with medical history significant of hypertension, severe LVH, diabetes, recurrent stroke, seizures, CKD IV, and asthma   Admitted for Pont[place] CVA  Present on Admission:  . CVA (cerebrovascular accident) [facility]) - will admit based on TIA/CVA protocol, Initial Stroke scale 16       Monitor on Tele       MRA/MRI  Resulted - showing subacute ischemic CVA       Not a candidate for CTA (CKD) Carotid Doppler        Echo to evaluate for possible embolic source,        obtain cardiac enzymes,  ECG,   Lipid panel, TSH.        Order PT/OT evaluation.        Keep nothing by mouth until passes swallow eval   will need Speech pathology evaluation       Will make sure patient is on antiplatelet ASA 81 Plavix agent and statin        Allow permissive Hypertension keep BP <220/120        Neurology consulted Have seen pt in ER   hx of seizure - continue Keppra  Dm 2-  - Order Sensitive SSI     -  check TSH and HgA1C  - Hold by mouth medications   . Hyperlipidemia -  continue Lipitor  . Essential hypertension - allow permissive HTN  . CKD (chronic kidney disease) stage 4, GFR 15-29 ml/min (HCC) -  -chronic avoid nephrotoxic medications such as NSAIDs, Vanco Zosyn combo,  avoid hypotension, continue to follow renal function  . Asthma, mild intermittent - stable continue home meds  . Anemia of chronic disease stable order anemia panel  Other plan as per orders.  DVT prophylaxis:  SCD      Code Status:    Code Status: Prior FULL CODE   as per patient   I had personally discussed CODE  STATUS with patient     Family Communication:   Family not at  Bedside   Disposition Plan:    To home once workup is complete and patient is stable   Following  barriers for discharge:                                                        Will need to be able to tolerate PO                                                    Will need consultants to evaluate patient prior to discharge                     Would benefit from PT/OT eval prior to DC  Ordered                   Swallow eval - SLP ordered                                        Consults called: neurology is aware   Admission status:  ED Disposition    ED Disposition Condition [place]: [place] [[id number]]  Level of Care: Telemetry Medical [104]  I expect the patient will be discharged within 24 hours: No (not a candidate for 5C-Observation unit)  Covid Evaluation: Confirmed COVID Negative  Diagnosis: CVA (cerebrovascular accident) [facility]) [[id number]]  Admitting Physician: Toy Baker [[id number]]  Attending Physician: Toy Baker [[id number]]       Obs   Level of care     tele   indefinitely please discontinue once patient no longer qualifies COVID-19 Labs    Lab Results  Component Value Date   [place] 06/19/2020    Precautions: admitted as Covid Negative   PPE: Used by the provider:   P100  eye Goggles,  Gloves     [place] Cervi 06/19/2020, 11:12 PM    [facility]     after 2 AM please page floor coverage PA If 7AM-7PM, please contact the day team taking care of the patient using [email or web address]   Patient was evaluated in the context of the global COVID-19 pandemic, which necessitated consideration that the patient might be at risk for infection with the SARS-CoV-2 virus that causes COVID-19. Institutional protocols and algorithms that pertain to the evaluation of patients at risk for COVID-19 are in a state of rapid change based on information  released by regulatory bodies including the CDC and federal and state organizations. These policies and algorithms were followed during the patient's care.

## 2020-06-19 NOTE — ED Triage Notes (Signed)
The pt was lsn 1700  She fell ?? REASON 1738 rt sided arm and leg  Weakness aphasia  Will not open mouth  Not moving her head  bp by ems was 240/110 p 70 cbg 172  She arrovee  4360   Pt cleared by neurologist and edp  [place] at 1748  Pt in c-t 1750 time of treatment decision 1800  Pt sent to mri after the c-t scan  1810  The pt was given  labatolol  10 mg iv at 1815 in mri  Speech clear in c-t scanner not making eye contact  bp 165/85 1835 leaving the scanner    Pt remains alert can mover her rt hand when she is trying to help remove her clothes

## 2020-06-19 NOTE — ED Notes (Signed)
When the pt was asked to look straight upward to check her pupils  She immediately looked to the lt side and did not do as asked  Both eyes moved together

## 2020-06-19 NOTE — ED Provider Notes (Signed)
[facility] EMERGENCY DEPARTMENT Provider Note   CSN: [id number] Arrival date & time: 06/19/20  1747     History No chief complaint on file.   Christina Orozco is a 50 y.o. female.  HPI   50 year old female with reported previous strokes presents to the emergency department as a code stroke.  Patient evaluated in EMS triage.  It is reported from EMS that the patient was last known normal about 45 minutes ago.  At that time the patient "passed out" and when she woke up she was not able to talk or move the right side of her body.  No reported seizure activity.  Patient is aphasic, history limited to this, she is following commands, eyes open, hypertensive but otherwise stable vitals.  Past Medical History:  Diagnosis Date  . Anemia    severe  . Asthma   . CKD (chronic kidney disease)    stage IV  . DDD (degenerative disc disease), cervical   . Diabetes mellitus ([place])   . Dislocated shoulder    right  . GERD (gastroesophageal reflux disease)   . Hypertension   . Neuropathy   . Stroke [facility])     Patient Active Problem List   Diagnosis Date Noted  . Abnormality of gait 10/14/2018  . Dizziness and giddiness 09/13/2018  . Debility 06/26/2018  . Chronic nonintractable headache 04/26/2018  . Diastolic dysfunction   . Congestion of nasal sinus   . Pneumonia of left lower lobe due to infectious organism   . SOB (shortness of breath)   . Candidiasis   . CKD (chronic kidney disease) stage 4, GFR 15-29 ml/min (HCC)   . Acute on chronic anemia   . Hypoalbuminemia due to protein-calorie malnutrition ([place])   . Essential hypertension   . Non-insulin dependent type 2 diabetes mellitus ([place])   . PRES (posterior reversible encephalopathy syndrome)   . Hypokalemia   . History of CVA with residual deficit   . History of seizure 03/25/2018  . Bilateral carpal tunnel syndrome 02/14/2018  . Arthritis 10/31/2017  . Asthma 10/31/2017  . Constipation 10/31/2017  . Kidney  stone 10/31/2017  . Anemia of chronic disease 08/09/2017  . Hyperlipidemia 07/26/2017  . 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[place]) 07/25/2017  . Anxiety as acute reaction to exceptional stress 04/23/2017  . Insomnia 10/08/2016  . Neuropathy 06/07/2016  . Gait instability   . Lesion of pons   . Slurred speech   . Dizziness 04/30/2016  . Hyperglycemia 04/30/2016  . Chronic kidney disease (CKD), stage III (moderate) ([place]) 04/30/2016  . Ataxia 04/30/2016  . Chronic right shoulder pain 04/03/2016  . 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 Rhinitis, allergic 08/25/2015  . Asthma, mild intermittent 07/14/2015  . Gastroesophageal reflux disease without esophagitis 06/08/2015  . Hypertensive cardiovascular disease 05/05/2015  . Vitamin D deficiency 05/05/2015    Past Surgical History:  Procedure Laterality Date  . ADENOIDECTOMY    . APPENDECTOMY    . CERVICAL SPINE SURGERY  06/2012   C5-C6  . EYE SURGERY     left eye surgery   . FOOT SURGERY    . LOOP RECORDER INSERTION N/A 08/27/2017   Procedure: LOOP RECORDER INSERTION;  Surgeon: Sanda Klein, MD;  Location: [facility] CV [facility];  Service: Cardiovascular;  Laterality: N/A;  . SHOULDER SURGERY    . TEE WITHOUT CARDIOVERSION N/A 08/27/2017   Procedure: TRANSESOPHAGEAL ECHOCARDIOGRAM (TEE);  Surgeon: Sanda Klein, MD;  Location: [facility];  Service: Cardiovascular;  Laterality: N/A;  .  TONSILLECTOMY       OB History   No obstetric history on file.     Family History  Problem Relation Age of Onset  . Vascular Disease Mother   . CAD Mother   . Heart failure Mother   . Heart disease Other   . Cancer Other        colon, stomach, pancreatic, lung  . Diabetes Other   . Breast cancer Sister 71  . Seizures Maternal Uncle     Social History   Tobacco Use  . Smoking status: Never Smoker  . Smokeless tobacco: Never Used  Vaping Use  . Vaping Use: Never used  Substance Use Topics  . Alcohol use: No  . Drug use: No    Home  Medications Prior to Admission medications   Medication Sig Start Date End Date Taking? Authorizing Provider  albuterol (PROAIR HFA) 108 (90 Base) MCG/ACT inhaler INHALE 1 OR 2 PUFFS INTO THE LUNGS EVERY 6 HOURS AS NEEDED FOR WHEEZING OR SHORTNESS OF BREATH 04/12/18   Angiulli, Lavon Paganini, PA-C  amLODipine (NORVASC) 10 MG tablet TAKE 1 TABLET BY MOUTH EVERY DAY 09/08/19   Erlene Quan, PA-C  aspirin 81 MG EC tablet Take by mouth.    [provider]  atorvastatin (LIPITOR) 80 MG tablet Take 80 mg by mouth daily. 02/25/20   [provider]  carvedilol (COREG) 25 MG tablet Take 1 tablet (25 mg total) by mouth 2 (two) times daily with a meal. Take 25 mg twice daily 04/12/18   Angiulli, Lavon Paganini, PA-C  Cholecalciferol (VITAMIN D3 PO) Take 2,000 mg by mouth daily.    [provider]  clopidogrel (PLAVIX) 75 MG tablet Take 1 tablet (75 mg total) by mouth daily. 04/12/18   Angiulli, Lavon Paganini, PA-C  doxazosin (CARDURA) 4 MG tablet TAKE 1 TABLET BY MOUTH EVERY DAY 09/08/19   Croitoru, Mihai, MD  DULoxetine (CYMBALTA) 60 MG capsule Take 60 mg by mouth daily.    [provider]  Eszopiclone 3 MG TABS Take 3 mg by mouth at bedtime. Take immediately before bedtime    [provider]  ferrous sulfate 325 (65 FE) MG tablet Take 1 tablet (325 mg total) by mouth daily. 04/12/18 04/06/20  Angiulli, Lavon Paganini, PA-C  furosemide (LASIX) 20 MG tablet Take 20 mg by mouth as directed. TAKE 1/2 TABLET DAILY. OK TO TAKE FULL TABLET AS NEEDED FOR SWELLING OR SHORTNESS OF BREATH 11/20/19   [provider]  glucose blood (CONTOUR NEXT TEST) test strip 1 each by Other route 2 (two) times daily. And lancets 2/day 04/12/18   Angiulli, Lavon Paganini, PA-C  hydrALAZINE (APRESOLINE) 25 MG tablet Take 25 mg by mouth every 8 (eight) hours as needed. FOR BLOOD PRESSURE ABOVE 150    [provider]  levETIRAcetam (KEPPRA) 500 MG tablet TAKE 1 TABLET(500 MG) BY MOUTH TWICE DAILY 07/15/18    Lanae Boast, FNP  meclizine (ANTIVERT) 25 MG tablet TAKE 1 TABLET(25 MG) BY MOUTH THREE TIMES DAILY AS NEEDED FOR DIZZINESS 06/10/18   Lanae Boast, FNP  pantoprazole (PROTONIX) 40 MG tablet TAKE 1 TABLET(40 MG) BY MOUTH DAILY 04/23/19   Tresa Garter, MD  pregabalin (LYRICA) 50 MG capsule Take 1 capsule by mouth at bedtime. 04/23/20   [provider]  Semaglutide,0.25 or 0.5MG /DOS, (OZEMPIC, 0.25 OR 0.5 MG/DOSE,) 2 MG/1.5ML SOPN INJECT 0.5 MG(0.4 ML) INTO THE SKIN ONCE A WEEK 02/27/20   [provider]  tiZANidine (ZANAFLEX) 2 MG  tablet Take 1/2 to 1 tablet every 8 hours as needed for neck spasm or cramping. 04/30/20   [provider]  topiramate (TOPAMAX) 25 MG tablet Start 1 hs, increase by 1 weekly to 4 hs 05/12/20   [provider]    Allergies    Lisinopril, Penicillins, and Gabapentin  Review of Systems   Review of Systems  Unable to perform ROS: Patient nonverbal    Physical Exam Updated Vital Signs LMP 05/22/2018 (Within Days)   Physical Exam Vitals and nursing note reviewed.  HENT:     Head: Normocephalic.     Nose: Nose normal.     Mouth/Throat:     Mouth: Mucous membranes are moist.  Eyes:     Extraocular Movements: Extraocular movements intact.     Pupils: Pupils are equal, round, and reactive to light.  Neck:     Comments: c collar in place Cardiovascular:     Rate and Rhythm: Normal rate.  Pulmonary:     Effort: Pulmonary effort is normal. No respiratory distress.  Abdominal:     Palpations: Abdomen is soft.     Tenderness: There is no abdominal tenderness.  Skin:    General: Skin is warm.  Neurological:     Mental Status: She is alert.     Comments: See NIH     ED Results / Procedures / Treatments   Labs (all labs ordered are listed, but only abnormal results are displayed) Labs Reviewed  CBC - Abnormal; Notable for the following components:      Result Value   RBC 3.53 (*)    Hemoglobin 10.5 (*)     HCT 31.5 (*)    All other components within normal limits  I-STAT CHEM 8, ED - Abnormal; Notable for the following components:   Sodium 146 (*)    Chloride 112 (*)    BUN 38 (*)    Creatinine, Ser 2.50 (*)    Glucose, Bld 131 (*)    Hemoglobin 10.9 (*)    HCT 32.0 (*)    All other components within normal limits  RESP PANEL BY RT-PCR (FLU A&B, COVID) ARPGX2  DIFFERENTIAL  PROTIME-INR  APTT  COMPREHENSIVE METABOLIC PANEL  I-STAT BETA HCG BLOOD, ED ([facility], [facility], AP ONLY)  CBG MONITORING, ED    EKG None  Radiology CT HEAD CODE STROKE WO CONTRAST  Result Date: 06/19/2020 CLINICAL DATA:  Code stroke. EXAM: CT HEAD WITHOUT CONTRAST TECHNIQUE: Contiguous axial images were obtained from the base of the skull through the vertex without intravenous contrast. COMPARISON:  09/17/2019, MRI 05/01/2020 FINDINGS: Brain: No acute intracranial hemorrhage, mass effect, or edema. No new loss of gray-white differentiation. Chronic infarcts of the right frontal white matter adjacent to the frontal horn, bilateral thalamus, and left caudothalamic groove. Ventricles and sulci are normal in size and configuration. There is no extra-axial collection. Vascular: No hyperdense vessel. Minimal intracranial atherosclerotic calcification at the skull base. Skull: Unremarkable. Sinuses/Orbits: No acute finding. Other: Small right frontal scalp lipoma. Mastoid air cells are clear. ASPECTS ([place] Stroke Program Early CT Score) - Ganglionic level infarction (caudate, lentiform nuclei, internal capsule, insula, M1-M3 cortex): 7 - Supraganglionic infarction (M4-M6 cortex): 3 Total score (0-10 with 10 being normal): 10 IMPRESSION: There is no acute intracranial hemorrhage or evidence of acute infarction. ASPECT score is 10. These results were communicated to Dr. Lorrin Goodell at 6:09 pm on 06/19/2020 by text page via the [facility] messaging system. Electronically Signed   By: Addison Lank.D.  On: 06/19/2020 18:13     Procedures .Critical Care Performed by: Lorelle Gibbs, DO Authorized by: Lorelle Gibbs, DO   Critical care provider statement:    Critical care time (minutes):  60   Critical care was time spent personally by me on the following activities:  Discussions with consultants, evaluation of patient's response to treatment, examination of patient, ordering and performing treatments and interventions, ordering and review of laboratory studies, ordering and review of radiographic studies, pulse oximetry, re-evaluation of patient's condition, obtaining history from patient or surrogate and review of old charts   I assumed direction of critical care for this patient from another provider in my specialty: no     (including critical care time)  Medications Ordered in ED Medications  sodium chloride flush (NS) 0.9 % injection 3 mL (has no administration in time range)  LORazepam (ATIVAN) injection 2 mg (has no administration in time range)  labetalol (NORMODYNE) injection 10 mg (has no administration in time range)  LORazepam (ATIVAN) 2 MG/ML injection (has no administration in time range)    ED Course  I have reviewed the triage vital signs and the nursing notes.  Pertinent labs & imaging results that were available during my care of the patient were reviewed by me and considered in my medical decision making (see chart for details).    MDM Rules/Calculators/A&P                          Patient evaluated in EMS triage as a code stroke. Neurology at bedside. NIH of 16. She is hypertensive, head CT without contrast shows no obvious bleed. Patient appeared to be flaccid in the right upper and lower extremity however on multiple evaluations with neurology at one point she did have full strength in the right upper extremity. For this reason we are pursuing a stat MRI prior to considering tPA administration for the possibility that this is not a genuine ischemic stroke.   MRI does show a  subacute stroke in the pons, this does not fully explain her dramatic presentation.  Neuro exam continues to be inconsistent, it is reported to the ER tech and nurse that at 1 point she had clear fluent speech and was able to lift the right upper extremity.  I have not witnessed this, my neuro exam is unchanged.  Patients evaluation and results requires admission for further treatment and care. Patient agrees with admission plan, offers no new complaints and is stable/unchanged at time of admit. Final Clinical Impression(s) / ED Diagnoses Final diagnoses:  None    Rx / DC Orders ED Discharge Orders    None       Lorelle Gibbs, DO 06/19/20 2047

## 2020-06-19 NOTE — ED Notes (Signed)
The pt reports that she has been having difficulty swallowing for approx 2 weeks  Failed swallow screen from this information.  She coughs a lot when she drinks and tries to eat

## 2020-06-19 NOTE — ED Notes (Signed)
The pt has clearly improved in speech and movement

## 2020-06-20 ENCOUNTER — Observation Stay (HOSPITAL_BASED_OUTPATIENT_CLINIC_OR_DEPARTMENT_OTHER): Payer: Medicaid Other

## 2020-06-20 ENCOUNTER — Observation Stay (HOSPITAL_COMMUNITY): Payer: Medicaid Other

## 2020-06-20 DIAGNOSIS — Z8673 Personal history of transient ischemic attack (TIA), and cerebral infarction without residual deficits: Secondary | ICD-10-CM | POA: Diagnosis not present

## 2020-06-20 DIAGNOSIS — J4520 Mild intermittent asthma, uncomplicated: Secondary | ICD-10-CM | POA: Diagnosis not present

## 2020-06-20 DIAGNOSIS — Z87898 Personal history of other specified conditions: Secondary | ICD-10-CM | POA: Diagnosis not present

## 2020-06-20 DIAGNOSIS — I6302 Cerebral infarction due to thrombosis of basilar artery: Secondary | ICD-10-CM | POA: Diagnosis not present

## 2020-06-20 DIAGNOSIS — N184 Chronic kidney disease, stage 4 (severe): Secondary | ICD-10-CM | POA: Diagnosis not present

## 2020-06-20 DIAGNOSIS — I639 Cerebral infarction, unspecified: Secondary | ICD-10-CM | POA: Diagnosis not present

## 2020-06-20 LAB — LIPID PANEL
Cholesterol: 130 mg/dL (ref 0–200)
HDL: 46 mg/dL (ref >40)
LDL Cholesterol: 66 mg/dL (ref 0–99)
Total CHOL/HDL Ratio: 2.8 RATIO
Triglycerides: 89 mg/dL (ref <150)
VLDL: 18 mg/dL (ref 0–40)

## 2020-06-20 LAB — URINALYSIS, ROUTINE W REFLEX MICROSCOPIC
Bacteria, UA: NONE SEEN
Bilirubin Urine: NEGATIVE
Glucose, UA: NEGATIVE mg/dL
Hgb urine dipstick: NEGATIVE
Ketones, ur: NEGATIVE mg/dL
Leukocytes,Ua: NEGATIVE
Nitrite: NEGATIVE
Protein, ur: 30 mg/dL — AB
Specific Gravity, Urine: 1.012 (ref 1.005–1.030)
pH: 5 (ref 5.0–8.0)

## 2020-06-20 LAB — RAPID URINE DRUG SCREEN, HOSP PERFORMED
Amphetamines: NOT DETECTED
Barbiturates: NOT DETECTED
Benzodiazepines: NOT DETECTED
Cocaine: NOT DETECTED
Opiates: NOT DETECTED
Tetrahydrocannabinol: NOT DETECTED

## 2020-06-20 LAB — RETICULOCYTES
Immature Retic Fract: 10.8 % (ref 2.3–15.9)
RBC.: 3.34 MIL/uL — ABNORMAL LOW (ref 3.87–5.11)
Retic Count, Absolute: 39.1 10*3/uL (ref 19.0–186.0)
Retic Ct Pct: 1.2 % (ref 0.4–3.1)

## 2020-06-20 LAB — FOLATE: Folate: 10.4 ng/mL (ref >5.9)

## 2020-06-20 LAB — CBG MONITORING, ED
Glucose-Capillary: 108 mg/dL — ABNORMAL HIGH (ref 70–99)
Glucose-Capillary: 111 mg/dL — ABNORMAL HIGH (ref 70–99)
Glucose-Capillary: 112 mg/dL — ABNORMAL HIGH (ref 70–99)
Glucose-Capillary: 123 mg/dL — ABNORMAL HIGH (ref 70–99)
Glucose-Capillary: 131 mg/dL — ABNORMAL HIGH (ref 70–99)
Glucose-Capillary: 89 mg/dL (ref 70–99)

## 2020-06-20 LAB — IRON AND TIBC
Iron: 34 ug/dL (ref 28–170)
Saturation Ratios: 15 % (ref 10.4–31.8)
TIBC: 220 ug/dL — ABNORMAL LOW (ref 250–450)
UIBC: 186 ug/dL

## 2020-06-20 LAB — FERRITIN: Ferritin: 124 ng/mL (ref 11–307)

## 2020-06-20 LAB — VITAMIN B12: Vitamin B-12: 624 pg/mL (ref 180–914)

## 2020-06-20 LAB — HIV ANTIBODY (ROUTINE TESTING W REFLEX): HIV Screen 4th Generation wRfx: NONREACTIVE

## 2020-06-20 MED ORDER — CLOPIDOGREL BISULFATE 75 MG PO TABS: 75.0000 mg | ORAL_TABLET | Freq: Every day | ORAL | Status: DC

## 2020-06-20 MED ORDER — ALBUTEROL SULFATE HFA 108 (90 BASE) MCG/ACT IN AERS
2.0000 | INHALATION_SPRAY | RESPIRATORY_TRACT | Status: DC | PRN
  Filled 2020-06-20: qty 6.7

## 2020-06-20 MED ORDER — AMLODIPINE BESYLATE 5 MG PO TABS
10.0000 mg | ORAL_TABLET | Freq: Every day | ORAL | Status: DC
Start: 2020-06-20 — End: 2020-06-20

## 2020-06-20 MED ORDER — ACETAMINOPHEN 650 MG RE SUPP: 650.0000 mg | RECTAL | Status: DC | PRN

## 2020-06-20 MED ORDER — HYDRALAZINE HCL 20 MG/ML IJ SOLN
10.0000 mg | INTRAMUSCULAR | Status: DC | PRN
  Administered 2020-06-20 (×2): 10 mg via INTRAVENOUS
  Filled 2020-06-20 (×2): qty 1

## 2020-06-20 MED ORDER — CARVEDILOL 12.5 MG PO TABS: 25.0000 mg | ORAL_TABLET | Freq: Two times a day (BID) | ORAL | Status: DC

## 2020-06-20 MED ORDER — TICAGRELOR 90 MG PO TABS
90.0000 mg | ORAL_TABLET | Freq: Two times a day (BID) | ORAL | Status: DC
  Administered 2020-06-20 – 2020-06-22 (×4): 90 mg via ORAL
  Filled 2020-06-20 (×4): qty 1

## 2020-06-20 MED ORDER — LEVETIRACETAM 500 MG PO TABS: 500.0000 mg | ORAL_TABLET | Freq: Two times a day (BID) | ORAL | Status: DC

## 2020-06-20 MED ORDER — LACTATED RINGERS IV SOLN: INTRAVENOUS | Status: DC

## 2020-06-20 MED ORDER — ACETAMINOPHEN 325 MG PO TABS
650.0000 mg | ORAL_TABLET | ORAL | Status: DC | PRN
  Administered 2020-06-20 – 2020-06-21 (×2): 650 mg via ORAL
  Filled 2020-06-20 (×2): qty 2

## 2020-06-20 MED ORDER — LEVETIRACETAM IN NACL 500 MG/100ML IV SOLN
500.0000 mg | Freq: Two times a day (BID) | INTRAVENOUS | Status: DC
  Administered 2020-06-20 – 2020-06-21 (×3): 500 mg via INTRAVENOUS
  Filled 2020-06-20 (×4): qty 100

## 2020-06-20 MED ORDER — ATORVASTATIN CALCIUM 80 MG PO TABS
80.0000 mg | ORAL_TABLET | Freq: Every day | ORAL | Status: DC
  Administered 2020-06-21 – 2020-06-22 (×2): 80 mg via ORAL
  Filled 2020-06-20 (×2): qty 1

## 2020-06-20 MED ORDER — ONDANSETRON HCL 4 MG/2ML IJ SOLN
4.0000 mg | Freq: Four times a day (QID) | INTRAMUSCULAR | Status: DC | PRN
  Administered 2020-06-20 (×2): 4 mg via INTRAVENOUS
  Filled 2020-06-20 (×2): qty 2

## 2020-06-20 MED ORDER — DULOXETINE HCL 60 MG PO CPEP
60.0000 mg | ORAL_CAPSULE | Freq: Every day | ORAL | Status: DC
  Administered 2020-06-21 – 2020-06-22 (×2): 60 mg via ORAL
  Filled 2020-06-20 (×3): qty 1

## 2020-06-20 MED ORDER — ASPIRIN EC 81 MG PO TBEC
81.0000 mg | DELAYED_RELEASE_TABLET | Freq: Every day | ORAL | Status: DC
  Administered 2020-06-21 – 2020-06-22 (×2): 81 mg via ORAL
  Filled 2020-06-20 (×2): qty 1

## 2020-06-20 MED ORDER — STROKE: EARLY STAGES OF RECOVERY BOOK
Freq: Once | Status: AC
  Filled 2020-06-20: qty 1

## 2020-06-20 MED ORDER — PANTOPRAZOLE SODIUM 40 MG PO TBEC
40.0000 mg | DELAYED_RELEASE_TABLET | Freq: Every day | ORAL | Status: DC
  Administered 2020-06-21 – 2020-06-22 (×2): 40 mg via ORAL
  Filled 2020-06-20 (×2): qty 1

## 2020-06-20 MED ORDER — PREGABALIN 25 MG PO CAPS
50.0000 mg | ORAL_CAPSULE | Freq: Every day | ORAL | Status: DC
  Administered 2020-06-20 – 2020-06-21 (×2): 50 mg via ORAL
  Filled 2020-06-20 (×2): qty 2

## 2020-06-20 MED ORDER — SODIUM CHLORIDE 0.9 % IV SOLN: INTRAVENOUS | Status: DC

## 2020-06-20 MED ORDER — ACETAMINOPHEN 160 MG/5ML PO SOLN: 650.0000 mg | ORAL | Status: DC | PRN

## 2020-06-20 MED ORDER — PROMETHAZINE HCL 25 MG/ML IJ SOLN
12.5000 mg | Freq: Once | INTRAMUSCULAR | Status: AC
  Administered 2020-06-21: 12.5 mg via INTRAVENOUS
  Filled 2020-06-20: qty 1

## 2020-06-20 MED ORDER — CARVEDILOL 12.5 MG PO TABS
12.5000 mg | ORAL_TABLET | Freq: Two times a day (BID) | ORAL | Status: DC
  Administered 2020-06-21 – 2020-06-22 (×3): 12.5 mg via ORAL
  Filled 2020-06-20 (×3): qty 1

## 2020-06-20 MED ORDER — RESOURCE THICKENUP CLEAR PO POWD
ORAL | Status: DC | PRN
  Filled 2020-06-20: qty 125

## 2020-06-20 NOTE — ED Notes (Signed)
The pt is reporting pain still in hier rt foot and ankle.  Her daughter is at  The bedside

## 2020-06-20 NOTE — ED Notes (Signed)
Patient transported to MRI 

## 2020-06-20 NOTE — ED Notes (Signed)
Pt is NSR on monitor 

## 2020-06-20 NOTE — ED Notes (Signed)
Stroke swallow screen not preformed buy this tech.

## 2020-06-20 NOTE — ED Notes (Signed)
Was told by previous RN patient needed loop recorder interrogated, per patient she no longer has one.   Primary team made aware.

## 2020-06-20 NOTE — ED Notes (Signed)
Attempted report. No answer. 

## 2020-06-20 NOTE — Evaluation (Signed)
Clinical/Bedside Swallow Evaluation Patient Details  Name: Christina Orozco MRN: [id number] Date of Birth: [birth year]  Today's Date: 06/20/2020 Time: SLP Start Time (ACUTE ONLY): 1411 SLP Stop Time (ACUTE ONLY): 1433 SLP Time Calculation (min) (ACUTE ONLY): 22 min  Past Medical History:  Past Medical History:  Diagnosis Date  . Anemia    severe  . Asthma   . CKD (chronic kidney disease)    stage IV  . DDD (degenerative disc disease), cervical   . Diabetes mellitus ([place])   . Dislocated shoulder    right  . GERD (gastroesophageal reflux disease)   . Hypertension   . Neuropathy   . Stroke [facility])    Past Surgical History:  Past Surgical History:  Procedure Laterality Date  . ADENOIDECTOMY    . APPENDECTOMY    . CERVICAL SPINE SURGERY  06/2012   C5-C6  . EYE SURGERY     left eye surgery   . FOOT SURGERY    . LOOP RECORDER INSERTION N/A 08/27/2017   Procedure: LOOP RECORDER INSERTION;  Surgeon: Sanda Klein, MD;  Location: [place] CV LAB;  Service: Cardiovascular;  Laterality: N/A;  . SHOULDER SURGERY    . TEE WITHOUT CARDIOVERSION N/A 08/27/2017   Procedure: TRANSESOPHAGEAL ECHOCARDIOGRAM (TEE);  Surgeon: Sanda Klein, MD;  [facility] ENDOSCOPY;  Service: Cardiovascular;  Laterality: N/A;  . TONSILLECTOMY     HPI:  Christina Orozco is a 50 y.o. female with medical history significant of hypertension, severe LVH, diabetes, recurrent stroke, seizures, CKD IV, and asthma . Presented with fall with right-sided weakness As well as aphasia. Reportedd two week history of difficulty swallowing. MRI shows Subcentimeter likely subacute infarct at the left lateral aspect of the pons.   Assessment / Plan / Recommendation Clinical Impression  Pt reports two week history of choking on drinks, but feels she also struggles with food. She has adapted to the problem by taking very small controlled sips, but today under observation still demonstrates intermittent coughing even with  very careful intake. When given nectar thick liquids pt happily reported that she could drink freely without difficulty. She is happy to try nectar today and SLP will also initaite solids of choice. Will proceed with MBS tomorrow for instrumental assessment of swallowing to determine best diet and strategies. SLP Visit Diagnosis: Dysphagia, oropharyngeal phase (R13.12)    Aspiration Risk  Mild aspiration risk    Diet Recommendation Regular;Nectar-thick liquid   Liquid Administration via: Cup Medication Administration: Whole meds with liquid Compensations: Slow rate;Small sips/bites Postural Changes: Seated upright at 90 degrees    Other  Recommendations Oral Care Recommendations: Oral care BID Other Recommendations: Order thickener from pharmacy   Follow up Recommendations        Frequency and Duration            Prognosis Prognosis for Safe Diet Advancement: Good      Swallow Study   General HPI: Christina Orozco is a 50 y.o. female with medical history significant of hypertension, severe LVH, diabetes, recurrent stroke, seizures, CKD IV, and asthma . Presented with fall with right-sided weakness As well as aphasia. Reportedd two week history of difficulty swallowing. MRI shows Subcentimeter likely subacute infarct at the left lateral aspect of the pons. Type of Study: Bedside Swallow Evaluation Previous Swallow Assessment: none Diet Prior to this Study: NPO Temperature Spikes Noted: No Respiratory Status: Room air History of Recent Intubation: No Behavior/Cognition: Alert;Cooperative;Pleasant mood Oral Cavity Assessment: Within Functional Limits Oral Care Completed by SLP: No Oral  Cavity - Dentition: Adequate natural dentition Vision: Functional for self-feeding Self-Feeding Abilities: Able to feed self Patient Positioning: Upright in bed Baseline Vocal Quality: Normal Volitional Cough: Strong Volitional Swallow: Able to elicit    Oral/Motor/Sensory Function     Ice  Chips     Thin Liquid Thin Liquid: Impaired Presentation: Cup;Self Fed Pharyngeal  Phase Impairments: Cough - Immediate    Nectar Thick Nectar Thick Liquid: Within functional limits Presentation: Cup;Self Fed   Honey Thick Honey Thick Liquid: Not tested   Puree Puree: Within functional limits Presentation: Self Fed   Solid     Solid: Not tested     Herbie Baltimore, MA [place] Pager [phone] Office [phone]  Lynann Beaver 06/20/2020,2:43 PM

## 2020-06-20 NOTE — ED Notes (Signed)
The pt  Reminded that she had not passed her swallow screen and could not get her normal meds

## 2020-06-20 NOTE — Progress Notes (Signed)
Progress Note    Christina Orozco  ZOX:[id number] DOB: Oct 28, 1970  DOA: 06/19/2020 PCP: Rikki Spearing, NP    Brief Narrative:    Medical records reviewed and are as summarized below:  Christina Orozco is an 50 y.o. female  with history of prior lacunar strokes, loop recorder and TEE March 2019, DM2, CKD stage 4, HTN, GERD, neuropathy who presents with acute onset R arm and leg weakness with aphasia and SBP 240's.Symptoms started after she fell at 1700 on 06/20/19.Noted on exam to be moving her RUE when no one is looking. She did not receive IV t-PA due to TPA functional symptoms and low concern for an organic cause.  Assessment/Plan:   Active Problems:   Asthma, mild intermittent   Hyperlipidemia   Anemia of chronic disease   Essential hypertension   Non-insulin dependent type 2 diabetes mellitus (HCC)   CKD (chronic kidney disease) stage 4, GFR 15-29 ml/min (HCC)   CVA (cerebrovascular accident) ([place])     CVA (cerebrovascular accident) ([place])  -   MRA/MRI  Resulted - showing subacute ischemic CVA  -SLP eval as NPO -echo pending -LDL: 66, HgbAc1: 6.2  - Allow permissive Hypertension keep BP <220/120    -Neurology consulted: aspirin 81 mg daily and clopidogrel 75 mg daily prior to admission, now on aspirin 81 mg daily and clopidogrel 75 mg daily. Recommend ASA and brilinta DAPT for one month and then back to ASA and plavix.  -patient denies having LOOP -does say she has conflict with un upstairs neighbor and has increased stress due to this -PT/OT  hx of seizure  - continue Keppra- IV Until passes swallow eval  Dm 2-   - Order Sensitive SSI   -HgbA1c: 6.2   Hyperlipidemia - -  continue Lipitor -LDL: 66   Essential hypertension  - allow permissive HTN for 24-48 hours   CKD (chronic kidney disease) stage 4, GFR 15-29 ml/min (HCC) -   -chronic avoid nephrotoxic medications   Asthma, mild intermittent  - stable continue home meds  Anemia of chronic  disease  -stable   Family Communication/Anticipated D/C date and plan/Code Status   DVT prophylaxis: Lovenox ordered. Code Status: Full Code.  Disposition Plan: Status is: Observation  The patient will require care spanning > 2 midnights and should be moved to inpatient because: Ongoing diagnostic testing needed not appropriate for outpatient work up  Dispo: The patient is from: Home              Anticipated d/c is to: Home              Anticipated d/c date is: 1 day              Patient currently is not medically stable to d/c.         Medical Consultants:    neurology  Subjective:   C/o stress from a neighbor Says her BP is usually controlled at home  Objective:    Vitals:   06/20/20 0815 06/20/20 0853 06/20/20 1015 06/20/20 1100  BP:   (!) 172/95 (!) 167/88  Pulse:   88 81  Resp:   16 16  Temp: 98.2 F (36.8 C) 98.2 F (36.8 C)    TempSrc: Oral Oral    SpO2:   100% 100%  Weight:      Height:        Intake/Output Summary (Last 24 hours) at 06/20/2020 1153 Last data filed at 06/20/2020 1047 Gross per 24 hour  Intake 100 ml  Output -  Net 100 ml   Filed Weights   06/19/20 1913  Weight: 69.9 kg    Exam:  General: Appearance:     Overweight female who appears anxious     Lungs:     Clear to auscultation bilaterally, respirations unlabored  Heart:    Normal heart rate. Normal rhythm. No murmurs, rubs, or gallops.   MS:   All extremities are intact.   Neurologic:   Awake, alert, oriented x 3.    Data Reviewed:   I have personally reviewed following labs and imaging studies:  Labs: Labs show the following:   Basic Metabolic Panel: Recent Labs  Lab 06/17/20 1053 06/19/20 1755 06/19/20 1756  NA 145* 146* 143  K 4.6 4.0 4.0  CL 111* 112* 111  CO2 22  --  20*  GLUCOSE 112* 131* 137*  BUN 28* 38* 35*  CREATININE 2.40* 2.50* 2.59*  CALCIUM 9.2  --  9.3   GFR Estimated Creatinine Clearance: 24.6 mL/min (A) (by C-G formula based on SCr of  2.59 mg/dL (H)). Liver Function Tests: Recent Labs  Lab 06/17/20 1053 06/19/20 1756  AST 16 18  ALT 12 16  ALKPHOS 76 58  BILITOT <0.2 0.5  PROT 6.7 7.1  ALBUMIN 4.2 3.8   No results for input(s): LIPASE, AMYLASE in the last 168 hours. No results for input(s): AMMONIA in the last 168 hours. Coagulation profile Recent Labs  Lab 06/19/20 1756  INR 1.0    CBC: Recent Labs  Lab 06/19/20 1755 06/19/20 1756  WBC  --  6.7  NEUTROABS  --  3.3  HGB 10.9* 10.5*  HCT 32.0* 31.5*  MCV  --  89.2  PLT  --  257   Cardiac Enzymes: Recent Labs  Lab 06/19/20 2145  CKTOTAL 153   BNP (last 3 results) No results for input(s): PROBNP in the last 8760 hours. CBG: Recent Labs  Lab 06/19/20 1854 06/19/20 2231 06/20/20 0020 06/20/20 0523 06/20/20 0840  GLUCAP 108* 91 89 123* 111*   D-Dimer: No results for input(s): DDIMER in the last 72 hours. Hgb A1c: Recent Labs    06/19/20 2145  HGBA1C 6.2*   Lipid Profile: Recent Labs    06/20/20 0851  CHOL 130  HDL 46  LDLCALC 66  TRIG 89  CHOLHDL 2.8   Thyroid function studies: No results for input(s): TSH, T4TOTAL, T3FREE, THYROIDAB in the last 72 hours.  Invalid input(s): FREET3 Anemia work up: Recent Labs    06/20/20 0625  VITAMINB12 624  FOLATE 10.4  FERRITIN 124  TIBC 220*  IRON 34  RETICCTPCT 1.2   Sepsis Labs: Recent Labs  Lab 06/19/20 1756  WBC 6.7    Microbiology Recent Results (from the past 240 hour(s))  Resp Panel by RT-PCR (Flu A&B, Covid) Nasopharyngeal Swab     Status: None   Collection Time: 06/19/20  6:08 PM   Specimen: Nasopharyngeal Swab; Nasopharyngeal(NP) swabs in vial transport medium  Result Value Ref Range Status   SARS Coronavirus 2 by RT PCR NEGATIVE NEGATIVE Final    Comment: (NOTE) SARS-CoV-2 target nucleic acids are NOT DETECTED.  The SARS-CoV-2 RNA is generally detectable in upper respiratory specimens during the acute phase of infection. The lowest concentration of  SARS-CoV-2 viral copies this assay can detect is 138 copies/mL. A negative result does not preclude SARS-Cov-2 infection and should not be used as the sole basis for treatment or other patient management decisions. A negative result  may occur with  improper specimen collection/handling, submission of specimen other than nasopharyngeal swab, presence of viral mutation(s) within the areas targeted by this assay, and inadequate number of viral copies(<138 copies/mL). A negative result must be combined with clinical observations, patient history, and epidemiological information. The expected result is Negative.  Fact Sheet for Patients:  EntrepreneurPulse.com.au  Fact Sheet for Healthcare Providers:  IncredibleEmployment.be  This test is no t yet approved or cleared by the [place] FDA and  has been authorized for detection and/or diagnosis of SARS-CoV-2 by FDA under an Emergency Use Authorization (EUA). This EUA will remain  in effect (meaning this test can be used) for the duration of the COVID-19 declaration under Section 564(b)(1) of the Act, 21 U.S.C.section 360bbb-3(b)(1), unless the authorization is terminated  or revoked sooner.       Influenza A by PCR NEGATIVE NEGATIVE Final   Influenza B by PCR NEGATIVE NEGATIVE Final    Comment: (NOTE) The Xpert Xpress SARS-CoV-2/FLU/RSV plus assay is intended as an aid in the diagnosis of influenza from Nasopharyngeal swab specimens and should not be used as a sole basis for treatment. Nasal washings and aspirates are unacceptable for Xpert Xpress SARS-CoV-2/FLU/RSV testing.  Fact Sheet for Patients: EntrepreneurPulse.com.au  Fact Sheet for Healthcare Providers: IncredibleEmployment.be  This test is not yet approved or cleared by the [place] FDA and has been authorized for detection and/or diagnosis of SARS-CoV-2 by FDA under an Emergency Use  Authorization (EUA). This EUA will remain in effect (meaning this test can be used) for the duration of the COVID-19 declaration under Section 564(b)(1) of the Act, 21 U.S.C. section 360bbb-3(b)(1), unless the authorization is terminated or revoked.  Performed at [facility], [place] [address], [place], [place] [address]     Procedures and diagnostic studies:  CT Cervical Spine Wo Contrast  Result Date: 06/19/2020 CLINICAL DATA:  Right arm and leg weakness.  Aphasia.  Fall. EXAM: CT CERVICAL SPINE WITHOUT CONTRAST TECHNIQUE: Multidetector CT imaging of the cervical spine was performed without intravenous contrast. Multiplanar CT image reconstructions were also generated. COMPARISON:  Most recent CT 09/17/2019 FINDINGS: Alignment: Stable straightening of normal lordosis. No traumatic subluxation. Skull base and vertebrae: No acute fracture. Vertebral body heights are maintained. The dens and skull base are intact. Anterior fusion hardware at C5-C6. No hardware complication. Incidental vertebral body hemangioma within T1 vertebral body extending into the left pedicle. Soft tissues and spinal canal: No prevertebral fluid or swelling. No visible canal hematoma. Disc levels: ACDF at C5-C6. Disc space narrowing with endplate spurring at J6-E8. Minimal C6-C7 disc space narrowing and endplate spurring. Upper chest: No acute findings. Other: None. IMPRESSION: 1. Mild degenerative change in the cervical spine without acute fracture or subluxation. 2. ACDF at C5-C6 without hardware complication. Electronically Signed   By: Keith Rake M.D.   On: 06/19/2020 21:28   MR ANGIO HEAD WO CONTRAST  Result Date: 06/20/2020 CLINICAL DATA:  Stroke workup EXAM: MRA HEAD WITHOUT CONTRAST TECHNIQUE: Angiographic images of the Circle of Willis were obtained using MRA technique without intravenous contrast. COMPARISON:  CTA head neck 03/26/2018 FINDINGS: Dominant left vertebral artery. Patent bilateral PICA with non  covered right-sided origin. The basilar is smooth and widely patent. Unremarkable superior cerebellar (early branching on the left) and posterior cerebral arteries. Smooth and widely patent internal carotid arteries and their branches. Negative for aneurysm or vascular malformation. Chronic microhemorrhages seen in the pons. IMPRESSION: Normal intracranial MRA. Electronically Signed   By: Angelica Chessman  Watts M.D.   On: 06/20/2020 09:46   MR BRAIN WO CONTRAST  Result Date: 06/19/2020 CLINICAL DATA:  Code stroke follow-up EXAM: MRI HEAD WITHOUT CONTRAST TECHNIQUE: Multiplanar, multiecho pulse sequences of the brain and surrounding structures were obtained without intravenous contrast. COMPARISON:  05/01/2020 FINDINGS: DWI, SWAN, axial T2 FLAIR, and sagittal T1 sequences were obtained. Additional sequences were not performed at the request of ordering physician. Brain: Small subcentimeter focus of diffusion hyperintensity at the left lateral aspect of the pons with corresponding ADC isointensity. Patchy and confluent areas of T2 hyperintensity in the supratentorial and pontine white matter are nonspecific but probably reflect stable chronic microvascular ischemic changes. There are chronic small vessel infarcts of the thalamus bilaterally, white matter adjacent to the right frontal horn, and right cerebellum. Multiple foci of susceptibility hypointensity are present with involvement of the pons, medulla, thalamus, basal ganglia, and cerebral white matter consistent with chronic microhemorrhages likely secondary to hypertension. Ventricles are stable in size. There is no intracranial mass or mass effect. No extra-axial fluid collection. Vascular: Major vessel flow voids at the skull base are preserved. Skull and upper cervical spine: Normal marrow signal is preserved. Sinuses/Orbits: Paranasal sinuses are aerated. Orbits are unremarkable. Other: Sella is unremarkable. Mastoid air cells are clear. Right frontal scalp  lipoma. IMPRESSION: Subcentimeter likely subacute infarct at the left lateral aspect of the pons. Stable chronic findings detailed above including multiple small infarcts, chronic microvascular ischemic changes, and likely hypertensive chronic microhemorrhages. These results were discussed by telephone at the time of interpretation on 06/19/2020 at 6:30 pm with provider [facility] , who verbally acknowledged these results. Electronically Signed   By: Macy Mis M.D.   On: 06/19/2020 18:47   DG CHEST PORT 1 VIEW  Result Date: 06/19/2020 CLINICAL DATA:  Pain status post fall EXAM: PORTABLE CHEST 1 VIEW COMPARISON:  June 07, 2020 FINDINGS: The heart size and mediastinal contours are within normal limits. Both lungs are clear. The visualized skeletal structures are unremarkable. IMPRESSION: No active disease. Electronically Signed   By: Constance Holster M.D.   On: 06/19/2020 21:45   CT HEAD CODE STROKE WO CONTRAST  Result Date: 06/19/2020 CLINICAL DATA:  Code stroke. EXAM: CT HEAD WITHOUT CONTRAST TECHNIQUE: Contiguous axial images were obtained from the base of the skull through the vertex without intravenous contrast. COMPARISON:  09/17/2019, MRI 05/01/2020 FINDINGS: Brain: No acute intracranial hemorrhage, mass effect, or edema. No new loss of gray-white differentiation. Chronic infarcts of the right frontal white matter adjacent to the frontal horn, bilateral thalamus, and left caudothalamic groove. Ventricles and sulci are normal in size and configuration. There is no extra-axial collection. Vascular: No hyperdense vessel. Minimal intracranial atherosclerotic calcification at the skull base. Skull: Unremarkable. Sinuses/Orbits: No acute finding. Other: Small right frontal scalp lipoma. Mastoid air cells are clear. ASPECTS ([place] Stroke Program Early CT Score) - Ganglionic level infarction (caudate, lentiform nuclei, internal capsule, insula, M1-M3 cortex): 7 - Supraganglionic infarction  (M4-M6 cortex): 3 Total score (0-10 with 10 being normal): 10 IMPRESSION: There is no acute intracranial hemorrhage or evidence of acute infarction. ASPECT score is 10. These results were communicated to Dr. Lorrin Goodell at 6:09 pm on 06/19/2020 by text page via the [facility] messaging system. Electronically Signed   By: Macy Mis M.D.   On: 06/19/2020 18:13    Medications:   .  stroke: mapping our early stages of recovery book   Does not apply Once  . aspirin EC  81 mg Oral Daily  .  atorvastatin  80 mg Oral Daily  . carvedilol  25 mg Oral BID WC  . DULoxetine  60 mg Oral Daily  . insulin aspart  0-9 Units Subcutaneous Q4H  . lidocaine-EPINEPHrine  10 mL Infiltration Once  . pantoprazole  40 mg Oral Daily  . pregabalin  50 mg Oral QHS  . sodium chloride flush  3 mL Intravenous Once  . ticagrelor  90 mg Oral BID   Continuous Infusions: . sodium chloride 75 mL/hr at 06/20/20 1017  . levETIRAcetam Stopped (06/20/20 1047)     LOS: 0 days   Geradine Girt  [facility]   How to contact the [facility] Attending or Consulting provider [place] or covering provider during after hours [place], for this patient?  1. Check the care team in [facility] and look for a) attending/consulting [facility] provider listed and b) the [facility] team listed 2. Log into [email or web address] and use [place]'s universal password to access. If you do not have the password, please contact the hospital operator. 3. Locate the [facility] provider you are looking for under [facility] and page to a number that you can be directly reached. 4. If you still have difficulty reaching the provider, please page the [facility] (Director on Call) for the Hospitalists listed on amion for assistance.  06/20/2020, 11:53 AM

## 2020-06-20 NOTE — Progress Notes (Signed)
STROKE TEAM PROGRESS NOTE   INTERVAL HISTORY No family is at the bedside.  Pt lying in bed, stated that she has a lot of stress lately due to conflict she has with her neighbor.  She has right-sided arm and leg giveaway weakness.  MRI however, showed left pontine acute/subacute infarct.  She has been following with neurologist in [facility] Dr. Doy Hutching and her next appointment 06/25/2020.  OBJECTIVE Vitals:   06/20/20 0400 06/20/20 0430 06/20/20 0500 06/20/20 0600  BP: (!) 175/85  (!) 150/88 (!) 170/90  Pulse:  88 80 89  Resp: 14 20 13 14   Temp:      TempSrc:      SpO2:  91% 98% 98%  Weight:      Height:        CBC:  Recent Labs  Lab 06/19/20 1755 06/19/20 1756  WBC  --  6.7  NEUTROABS  --  3.3  HGB 10.9* 10.5*  HCT 32.0* 31.5*  MCV  --  89.2  PLT  --  166    Basic Metabolic Panel:  Recent Labs  Lab 06/17/20 1053 06/19/20 1755 06/19/20 1756  NA 145* 146* 143  K 4.6 4.0 4.0  CL 111* 112* 111  CO2 22  --  20*  GLUCOSE 112* 131* 137*  BUN 28* 38* 35*  CREATININE 2.40* 2.50* 2.59*  CALCIUM 9.2  --  9.3    Lipid Panel:     Component Value Date/Time   CHOL 135 06/17/2020 1053   TRIG 142 06/17/2020 1053   HDL 47 06/17/2020 1053   CHOLHDL 2.9 06/17/2020 1053   CHOLHDL 2.4 07/26/2017 1118   VLDL 14 07/26/2017 1118   LDLCALC 63 06/17/2020 1053   HgbA1c:  Lab Results  Component Value Date   HGBA1C 6.2 (H) 06/19/2020   Urine Drug Screen:     Component Value Date/Time   LABOPIA NONE DETECTED 03/25/2018 1919   COCAINSCRNUR NONE DETECTED 03/25/2018 1919   LABBENZ NONE DETECTED 03/25/2018 1919   AMPHETMU NONE DETECTED 03/25/2018 1919   THCU NONE DETECTED 03/25/2018 1919   LABBARB NONE DETECTED 03/25/2018 1919    Alcohol Level No results found for: Jay  MR BRAIN WO CONTRAST 06/19/2020 IMPRESSION:  Subcentimeter likely subacute infarct at the left lateral aspect of the pons. Stable chronic findings detailed above including multiple small infarcts,  chronic microvascular ischemic changes, and likely hypertensive chronic microhemorrhages.   CT HEAD CODE STROKE WO CONTRAST 06/19/2020 IMPRESSION:  There is no acute intracranial hemorrhage or evidence of acute infarction. ASPECT score is 10.  DG CHEST PORT 1 VIEW 06/19/2020 IMPRESSION:  No active disease.  CT Cervical Spine Wo Contrast 06/19/2020 IMPRESSION:  1. Mild degenerative change in the cervical spine without acute fracture or subluxation.  2. ACDF at C5-C6 without hardware complication.  Transthoracic Echocardiogram  00/00/2021 Pending  Bilateral Carotid Dopplers  00/00/2021 Pending  ECG - SR rate 80 BPM. (See cardiology reading for complete details)  PHYSICAL EXAM  Temp:  [98.2 F (36.8 C)-98.3 F (36.8 C)] 98.2 F (36.8 C) (01/09 0853) Pulse Rate:  [70-89] 81 (01/09 1100) Resp:  [12-21] 16 (01/09 1100) BP: (150-240)/(75-115) 167/88 (01/09 1100) SpO2:  [91 %-100 %] 100 % (01/09 1100) Weight:  [69.9 kg] 69.9 kg (01/08 1913)  General - Well nourished, well developed, in no apparent distress.  Ophthalmologic - fundi not visualized due to noncooperation.  Cardiovascular - Regular rhythm and rate.  Mental Status -  Level of arousal and orientation  to time, place, and person were intact. Language including expression, naming, repetition, comprehension was assessed and found intact. Fund of Knowledge was assessed and was intact.  Cranial Nerves II - XII - II - Visual field intact OD, only sees hand waving OS. III, IV, VI - Extraocular movements intact. V - Facial sensation intact bilaterally. VII - Facial movement intact bilaterally. VIII - Hearing & vestibular intact bilaterally. X - Palate elevates symmetrically. XI - Chin turning & shoulder shrug intact bilaterally. XII - Tongue protrusion intact.  Motor Strength - The patient's strength was normal in left upper and lower extremities, however difficulty in checking right upper lower extremity strength due  to significant giveaway weakness, however, patient was able to do right finger-to-nose slowly and right lower extremity at least 2/5 proximally.  Bulk was normal and fasciculations were absent.   Motor Tone - Muscle tone was assessed at the neck and appendages and was normal.  Reflexes - The patient's reflexes were symmetrical in all extremities and she had no pathological reflexes.  Sensory - Light touch, temperature/pinprick were assessed and were subjectively decreased on the right.    Coordination - The patient had normal movements in the hands with no ataxia or dysmetria, although slow on the right.  Tremor was absent.  Gait and Station - deferred.   ASSESSMENT/PLAN Christina Orozco is a 50 y.o. female with history of prior lacunar strokes, loop recorder and TEE March 2019, DM2, CKD stage 4, HTN, GERD, neuropathy who presents with acute onset R arm and leg weakness with aphasia and SBP 240's.Symptoms started after she fell at 1700 on 06/20/19.Noted on exam to be moving her RUE when no one is looking. She did not receive IV t-PA due to TPA functional symptoms and low concern for an organic cause.  Stroke: small acute/subacute infarct at the left lateral aspect of the pons - small vessel disease.  CT Head - There is no acute intracranial hemorrhage or evidence of acute infarction. ASPECT score is 10.   MRI head - Subcentimeter likely subacute infarct at the left lateral aspect of the pons. Stable chronic findings detailed above including multiple small infarcts  MRA head unremarkable  Carotid Doppler - pending  2D Echo - pending  Loop recorder interrogation pending  [employer] Virus 2 - negative  LDL - 63  HgbA1c - 6.2  UDS - pending  VTE prophylaxis - none  aspirin 81 mg daily and clopidogrel 75 mg daily prior to admission, now on aspirin 81 mg daily and clopidogrel 75 mg daily. Recommend ASA and brilinta DAPT for one month and then back to ASA and plavix.   Patient  will be counseled to be compliant with her antithrombotic medications  Ongoing aggressive stroke risk factor management  Therapy recommendations:  pending  Disposition:  Pending  Hx of stroke  07/2017 MRI showed left, EF 65 to 70%, carotid Doppler and MRA negative.  LDL 43, A1c 12.5, patient put on DAPT and Lipitor 40.  08/2017 Loop recorder placed, no A. fib so far -current interrogation pending  02/27/2019 woke up with complete vision loss, gradually vision resumed OD, still has better vision OS.  MRI at that time showed 3 mm acute to early subacute infarct in the dorsal midline of the pons negative MRI head and neck.  05/01/2020 MRI showed new T2 hyperintense lesion of the right frontal white matter likely reflect an area of late subacute or chronic infarction.  Patient follows with Dr. Doy Hutching at [facility],  next appointment 06/25/2020  Patient stated that she compliant with medication aspirin and Plavix.  Hx of seizure  03/2018 new onset seizure, discharged with Keppra  Still on Keppra, so far no recurrence  Continue Keppra at this time  Follows with Dr. Doy Hutching at [facility]  Hypertension  Home BP meds: Coreg ; Apresoline ; Norvasc  Current BP meds: Labetalol prn  Stable . Permissive hypertension (OK if < 220/120) but gradually normalize in 2-3 days  . Long-term BP goal normotensive  Hyperlipidemia  Home Lipid lowering medication:  Lipitor 80 mg daily   LDL 63, goal < 70  Current lipid lowering medication: Lipitor 80 mg daily   Continue statin at discharge  Diabetes, controlled  Home diabetic meds: none  Current diabetic meds: SSI   HgbA1c 6.2, goal < 7.0  CKD stage IV  creatinine - 2.59  Anemia likely due to chronic kidney disease  Dysphagia  Patient refused swallow screen at bedside  Pending swallow evaluation  Consider p.o. meds after p.o. access  Other Stroke Risk Factors  Stress at home with [facility] day # 0  [name], MD  PhD Stroke Neurology 06/20/2020 11:24 AM  I had a long discussion with patient and bedside regarding stroke risk factor modification and coping with stress.  I have reviewed extensively her medical record.  I spent  35 minutes in total face-to-face time with the patient, more than 50% of which was spent in counseling and coordination of care, reviewing test results, images and medication, and discussing the diagnosis, treatment plan and potential prognosis. This patient's care requiresreview of multiple databases, neurological assessment, discussion with family, other specialists and medical decision making of high complexity.   To contact Stroke Continuity provider, please refer to [email or web address] After hours, contact General Neurology

## 2020-06-20 NOTE — ED Notes (Signed)
Pt c/o nausea and dizziness

## 2020-06-20 NOTE — Progress Notes (Signed)
Carotid artery duplex has been completed. Preliminary results can be found in CV Proc through chart review.   06/20/20 12:30 PM Christina Orozco RVT

## 2020-06-20 NOTE — ED Notes (Signed)
Patient resting in bed, no issues noted at this time.

## 2020-06-20 NOTE — ED Notes (Signed)
Pt asleep.

## 2020-06-21 ENCOUNTER — Observation Stay (HOSPITAL_COMMUNITY): Payer: Medicaid Other

## 2020-06-21 ENCOUNTER — Other Ambulatory Visit: Payer: Medicaid Other

## 2020-06-21 DIAGNOSIS — H547 Unspecified visual loss: Secondary | ICD-10-CM | POA: Diagnosis present

## 2020-06-21 DIAGNOSIS — E1140 Type 2 diabetes mellitus with diabetic neuropathy, unspecified: Secondary | ICD-10-CM | POA: Diagnosis present

## 2020-06-21 DIAGNOSIS — Z7902 Long term (current) use of antithrombotics/antiplatelets: Secondary | ICD-10-CM | POA: Diagnosis not present

## 2020-06-21 DIAGNOSIS — Z803 Family history of malignant neoplasm of breast: Secondary | ICD-10-CM | POA: Diagnosis not present

## 2020-06-21 DIAGNOSIS — Z8249 Family history of ischemic heart disease and other diseases of the circulatory system: Secondary | ICD-10-CM | POA: Diagnosis not present

## 2020-06-21 DIAGNOSIS — E1122 Type 2 diabetes mellitus with diabetic chronic kidney disease: Secondary | ICD-10-CM | POA: Diagnosis present

## 2020-06-21 DIAGNOSIS — R531 Weakness: Secondary | ICD-10-CM | POA: Diagnosis present

## 2020-06-21 DIAGNOSIS — J4520 Mild intermittent asthma, uncomplicated: Secondary | ICD-10-CM | POA: Diagnosis present

## 2020-06-21 DIAGNOSIS — Z7982 Long term (current) use of aspirin: Secondary | ICD-10-CM | POA: Diagnosis not present

## 2020-06-21 DIAGNOSIS — I129 Hypertensive chronic kidney disease with stage 1 through stage 4 chronic kidney disease, or unspecified chronic kidney disease: Secondary | ICD-10-CM | POA: Diagnosis present

## 2020-06-21 DIAGNOSIS — K219 Gastro-esophageal reflux disease without esophagitis: Secondary | ICD-10-CM | POA: Diagnosis present

## 2020-06-21 DIAGNOSIS — I6302 Cerebral infarction due to thrombosis of basilar artery: Secondary | ICD-10-CM | POA: Diagnosis not present

## 2020-06-21 DIAGNOSIS — W010XXA Fall on same level from slipping, tripping and stumbling without subsequent striking against object, initial encounter: Secondary | ICD-10-CM | POA: Diagnosis present

## 2020-06-21 DIAGNOSIS — I6389 Other cerebral infarction: Secondary | ICD-10-CM | POA: Diagnosis not present

## 2020-06-21 DIAGNOSIS — I639 Cerebral infarction, unspecified: Secondary | ICD-10-CM | POA: Diagnosis present

## 2020-06-21 DIAGNOSIS — Z888 Allergy status to other drugs, medicaments and biological substances status: Secondary | ICD-10-CM | POA: Diagnosis not present

## 2020-06-21 DIAGNOSIS — N184 Chronic kidney disease, stage 4 (severe): Secondary | ICD-10-CM | POA: Diagnosis present

## 2020-06-21 DIAGNOSIS — Z880 Allergy status to penicillin: Secondary | ICD-10-CM | POA: Diagnosis not present

## 2020-06-21 DIAGNOSIS — Z20822 Contact with and (suspected) exposure to covid-19: Secondary | ICD-10-CM | POA: Diagnosis present

## 2020-06-21 DIAGNOSIS — Z833 Family history of diabetes mellitus: Secondary | ICD-10-CM | POA: Diagnosis not present

## 2020-06-21 DIAGNOSIS — D631 Anemia in chronic kidney disease: Secondary | ICD-10-CM | POA: Diagnosis present

## 2020-06-21 DIAGNOSIS — E876 Hypokalemia: Secondary | ICD-10-CM | POA: Diagnosis not present

## 2020-06-21 DIAGNOSIS — E785 Hyperlipidemia, unspecified: Secondary | ICD-10-CM | POA: Diagnosis present

## 2020-06-21 DIAGNOSIS — D638 Anemia in other chronic diseases classified elsewhere: Secondary | ICD-10-CM | POA: Diagnosis present

## 2020-06-21 DIAGNOSIS — R4701 Aphasia: Secondary | ICD-10-CM | POA: Diagnosis present

## 2020-06-21 DIAGNOSIS — Z7984 Long term (current) use of oral hypoglycemic drugs: Secondary | ICD-10-CM | POA: Diagnosis not present

## 2020-06-21 DIAGNOSIS — Z8673 Personal history of transient ischemic attack (TIA), and cerebral infarction without residual deficits: Secondary | ICD-10-CM | POA: Diagnosis not present

## 2020-06-21 LAB — GLUCOSE, CAPILLARY
Glucose-Capillary: 102 mg/dL — ABNORMAL HIGH (ref 70–99)
Glucose-Capillary: 108 mg/dL — ABNORMAL HIGH (ref 70–99)
Glucose-Capillary: 110 mg/dL — ABNORMAL HIGH (ref 70–99)
Glucose-Capillary: 124 mg/dL — ABNORMAL HIGH (ref 70–99)
Glucose-Capillary: 139 mg/dL — ABNORMAL HIGH (ref 70–99)
Glucose-Capillary: 97 mg/dL (ref 70–99)

## 2020-06-21 LAB — BASIC METABOLIC PANEL
Anion gap: 11 (ref 5–15)
BUN: 26 mg/dL — ABNORMAL HIGH (ref 6–20)
CO2: 20 mmol/L — ABNORMAL LOW (ref 22–32)
Calcium: 9.3 mg/dL (ref 8.9–10.3)
Chloride: 114 mmol/L — ABNORMAL HIGH (ref 98–111)
Creatinine, Ser: 2.27 mg/dL — ABNORMAL HIGH (ref 0.44–1.00)
GFR, Estimated: 26 mL/min — ABNORMAL LOW (ref >60)
Glucose, Bld: 100 mg/dL — ABNORMAL HIGH (ref 70–99)
Potassium: 3.4 mmol/L — ABNORMAL LOW (ref 3.5–5.1)
Sodium: 145 mmol/L (ref 135–145)

## 2020-06-21 LAB — CBC
HCT: 27.8 % — ABNORMAL LOW (ref 36.0–46.0)
Hemoglobin: 9.2 g/dL — ABNORMAL LOW (ref 12.0–15.0)
MCH: 29.1 pg (ref 26.0–34.0)
MCHC: 33.1 g/dL (ref 30.0–36.0)
MCV: 88 fL (ref 80.0–100.0)
Platelets: 249 10*3/uL (ref 150–400)
RBC: 3.16 MIL/uL — ABNORMAL LOW (ref 3.87–5.11)
RDW: 13.2 % (ref 11.5–15.5)
WBC: 5.7 10*3/uL (ref 4.0–10.5)
nRBC: 0 % (ref 0.0–0.2)

## 2020-06-21 LAB — ECHOCARDIOGRAM COMPLETE
Area-P 1/2: 2.54 cm2
Height: 62 in
S' Lateral: 1.7 cm
Weight: 2465.62 oz

## 2020-06-21 MED ORDER — MECLIZINE HCL 12.5 MG PO TABS
25.0000 mg | ORAL_TABLET | Freq: Three times a day (TID) | ORAL | Status: DC | PRN
  Administered 2020-06-21 – 2020-06-22 (×3): 25 mg via ORAL
  Filled 2020-06-21 (×3): qty 2

## 2020-06-21 MED ORDER — LEVETIRACETAM 500 MG PO TABS: 500.0000 mg | ORAL_TABLET | Freq: Two times a day (BID) | ORAL | Status: DC

## 2020-06-21 MED ORDER — LEVETIRACETAM 500 MG PO TABS
500.0000 mg | ORAL_TABLET | Freq: Two times a day (BID) | ORAL | Status: DC
Start: 2020-06-21 — End: 2020-06-22
  Administered 2020-06-21 – 2020-06-22 (×2): 500 mg via ORAL
  Filled 2020-06-21 (×2): qty 1

## 2020-06-21 MED ORDER — POTASSIUM CHLORIDE CRYS ER 20 MEQ PO TBCR
20.0000 meq | EXTENDED_RELEASE_TABLET | Freq: Once | ORAL | Status: AC
  Administered 2020-06-21: 20 meq via ORAL
  Filled 2020-06-21: qty 1

## 2020-06-21 NOTE — Progress Notes (Signed)
PROGRESS NOTE    Christina Orozco  XNA:[id number] DOB: 08/19/1970 DOA: 06/19/2020 PCP: Rikki Spearing, NP   Brief Narrative: 50 year old with past medical history significant for prior lacunar stroke, loop recorder and TEE March 2019, diabetes, CKD stage IV, hypertension, GERD, neuropathy who presents with acute onset of right arm and leg weakness with aphasia and systolic blood pressure to 140s.  Symptoms started after she fell at 19 on 06/19/2020.  Noted on exam to be moving right upper extremity within no one is looking.  She did not receive IV tPA due to tPA [place] symptoms and low concern for organic cause.  Currently MRI shows subacute ischemic CVA.   Assessment & Plan:   Active Problems:   Asthma, mild intermittent   Hyperlipidemia   Anemia of chronic disease   Essential hypertension   Non-insulin dependent type 2 diabetes mellitus (HCC)   CKD (chronic kidney disease) stage 4, GFR 15-29 ml/min (HCC)   CVA (cerebrovascular accident) ([place])   CVA (cerebral vascular accident) ([place])  1-CVA;  -MRA/MRI showing subacute ischemic CVA -Evaluated by speech therapy today, they were not able to rule out a mass at the base of  patient tongue.  LDL 66, hemoglobin A1c 6.2 Neurology recommended aspirin plus Brilinta for 1 month and then back to aspirin and Plavix after. Placed on regular diet.  Evaluated by speech therapy today, they were not able to rule out a mass at the base of  patient tongue.: CT neck soft tissue order to evaluate.   History of seizure: Continue with Keppra  Diabetes: Continue with sliding scale insulin  Hyperlipidemia: Continue with Lipitor  Essential hypertension; allow permissive hypertension Continue with Coreg  CKD stage IV: Avoid nephrotoxic medication. Mild hypokalemia: Replace  Asthma: Mild intermittent: Stable  Anemia of chronic disease: Stable       Estimated body mass index is 28.19 kg/m as calculated from the following:   Height as of  this encounter: 5\' 2"  (1.575 m).   Weight as of this encounter: 69.9 kg.   DVT prophylaxis: SCD Code Status: Full code Family Communication: are discussed with patient.  Disposition Plan:  Status is: Inpatient  Remains inpatient appropriate because:Ongoing diagnostic testing needed not appropriate for outpatient work up   Dispo: The patient is from: Home              Anticipated d/c is to: CIR              Anticipated d/c date is: 2 days              Patient currently is not medically stable to d/c.Awaiting to complete Work up, PT recommend CIR        Consultants:   Neurology   Procedures:  ECHO; no evidence of thrombus.   Antimicrobials:    Subjective: She is alert, denies pain.  She has had shocking episode while eating at home    Objective: Vitals:   06/20/20 2337 06/21/20 0442 06/21/20 0835 06/21/20 1203  BP: (!) 178/87 (!) 166/86 (!) 167/86 (!) 158/86  Pulse: (!) 107 (!) 110 96 82  Resp: 18 16 18 18   Temp: 98 F (36.7 C) 98.7 F (37.1 C) 97.8 F (36.6 C) 98 F (36.7 C)  TempSrc: Oral Oral Oral Axillary  SpO2: 100% 100% 100% 100%  Weight:      Height:       No intake or output data in the 24 hours ending 06/21/20 [place]   06/19/20 1913  Weight: 69.9 kg    Examination:  General exam: Appears calm and comfortable  Respiratory system: Clear to auscultation. Respiratory effort normal. Cardiovascular system: S1 & S2 heard, RRR. No JVD, murmurs, rubs, gallops or clicks. No pedal edema. Gastrointestinal system: Abdomen is nondistended, soft and nontender. No organomegaly or masses felt. Normal bowel sounds heard. Central nervous system: Alert and oriented Extremities: Symmetric 5 x 5 power.   Data Reviewed: I have personally reviewed following labs and imaging studies  CBC: Recent Labs  Lab 06/19/20 1755 06/19/20 1756 06/21/20 0451  WBC  --  6.7 5.7  NEUTROABS  --  3.3  --   HGB 10.9* 10.5* 9.2*  HCT 32.0* 31.5* 27.8*  MCV   --  89.2 88.0  PLT  --  257 161   Basic Metabolic Panel: Recent Labs  Lab 06/17/20 1053 06/19/20 1755 06/19/20 1756 06/21/20 0451  NA 145* 146* 143 145  K 4.6 4.0 4.0 3.4*  CL 111* 112* 111 114*  CO2 22  --  20* 20*  GLUCOSE 112* 131* 137* 100*  BUN 28* 38* 35* 26*  CREATININE 2.40* 2.50* 2.59* 2.27*  CALCIUM 9.2  --  9.3 9.3   GFR: Estimated Creatinine Clearance: 28.1 mL/min (A) (by C-G formula based on SCr of 2.27 mg/dL (H)). Liver Function Tests: Recent Labs  Lab 06/17/20 1053 06/19/20 1756  AST 16 18  ALT 12 16  ALKPHOS 76 58  BILITOT <0.2 0.5  PROT 6.7 7.1  ALBUMIN 4.2 3.8   No results for input(s): LIPASE, AMYLASE in the last 168 hours. No results for input(s): AMMONIA in the last 168 hours. Coagulation Profile: Recent Labs  Lab 06/19/20 1756  INR 1.0   Cardiac Enzymes: Recent Labs  Lab 06/19/20 2145  CKTOTAL 153   BNP (last 3 results) No results for input(s): PROBNP in the last 8760 hours. HbA1C: Recent Labs    06/19/20 2145  HGBA1C 6.2*   CBG: Recent Labs  Lab 06/20/20 2032 06/21/20 0013 06/21/20 0444 06/21/20 0806 06/21/20 1205  GLUCAP 131* 139* 97 102* 108*   Lipid Profile: Recent Labs    06/20/20 0851  CHOL 130  HDL 46  LDLCALC 66  TRIG 89  CHOLHDL 2.8   Thyroid Function Tests: No results for input(s): TSH, T4TOTAL, FREET4, T3FREE, THYROIDAB in the last 72 hours. Anemia Panel: Recent Labs    06/20/20 0625  VITAMINB12 624  FOLATE 10.4  FERRITIN 124  TIBC 220*  IRON 34  RETICCTPCT 1.2   Sepsis Labs: No results for input(s): PROCALCITON, LATICACIDVEN in the last 168 hours.  Recent Results (from the past 240 hour(s))  Resp Panel by RT-PCR (Flu A&B, Covid) Nasopharyngeal Swab     Status: None   Collection Time: 06/19/20  6:08 PM   Specimen: Nasopharyngeal Swab; Nasopharyngeal(NP) swabs in vial transport medium  Result Value Ref Range Status   SARS Coronavirus 2 by RT PCR NEGATIVE NEGATIVE Final    Comment:  (NOTE) SARS-CoV-2 target nucleic acids are NOT DETECTED.  The SARS-CoV-2 RNA is generally detectable in upper respiratory specimens during the acute phase of infection. The lowest concentration of SARS-CoV-2 viral copies this assay can detect is 138 copies/mL. A negative result does not preclude SARS-Cov-2 infection and should not be used as the sole basis for treatment or other patient management decisions. A negative result may occur with  improper specimen collection/handling, submission of specimen other than nasopharyngeal swab, presence of viral mutation(s) within the areas targeted by this assay, and  inadequate number of viral copies(<138 copies/mL). A negative result must be combined with clinical observations, patient history, and epidemiological information. The expected result is Negative.  Fact Sheet for Patients:  EntrepreneurPulse.com.au  Fact Sheet for Healthcare Providers:  IncredibleEmployment.be  This test is no t yet approved or cleared by the [place] FDA and  has been authorized for detection and/or diagnosis of SARS-CoV-2 by FDA under an Emergency Use Authorization (EUA). This EUA will remain  in effect (meaning this test can be used) for the duration of the COVID-19 declaration under Section 564(b)(1) of the Act, 21 U.S.C.section 360bbb-3(b)(1), unless the authorization is terminated  or revoked sooner.       Influenza A by PCR NEGATIVE NEGATIVE Final   Influenza B by PCR NEGATIVE NEGATIVE Final    Comment: (NOTE) The Xpert Xpress SARS-CoV-2/FLU/RSV plus assay is intended as an aid in the diagnosis of influenza from Nasopharyngeal swab specimens and should not be used as a sole basis for treatment. Nasal washings and aspirates are unacceptable for Xpert Xpress SARS-CoV-2/FLU/RSV testing.  Fact Sheet for Patients: EntrepreneurPulse.com.au  Fact Sheet for Healthcare  Providers: IncredibleEmployment.be  This test is not yet approved or cleared by the [place] FDA and has been authorized for detection and/or diagnosis of SARS-CoV-2 by FDA under an Emergency Use Authorization (EUA). This EUA will remain in effect (meaning this test can be used) for the duration of the COVID-19 declaration under Section 564(b)(1) of the Act, 21 U.S.C. section 360bbb-3(b)(1), unless the authorization is terminated or revoked.  Performed at [facility], [place] [address] [place], [place] [address]          Radiology Studies: CT Cervical Spine Wo Contrast  Result Date: 06/19/2020 CLINICAL DATA:  Right arm and leg weakness.  Aphasia.  Fall. EXAM: CT CERVICAL SPINE WITHOUT CONTRAST TECHNIQUE: Multidetector CT imaging of the cervical spine was performed without intravenous contrast. Multiplanar CT image reconstructions were also generated. COMPARISON:  Most recent CT 09/17/2019 FINDINGS: Alignment: Stable straightening of normal lordosis. No traumatic subluxation. Skull base and vertebrae: No acute fracture. Vertebral body heights are maintained. The dens and skull base are intact. Anterior fusion hardware at C5-C6. No hardware complication. Incidental vertebral body hemangioma within T1 vertebral body extending into the left pedicle. Soft tissues and spinal canal: No prevertebral fluid or swelling. No visible canal hematoma. Disc levels: ACDF at C5-C6. Disc space narrowing with endplate spurring at N2-T5. Minimal C6-C7 disc space narrowing and endplate spurring. Upper chest: No acute findings. Other: None. IMPRESSION: 1. Mild degenerative change in the cervical spine without acute fracture or subluxation. 2. ACDF at C5-C6 without hardware complication. Electronically Signed   By: Keith Rake M.D.   On: 06/19/2020 21:28   MR ANGIO HEAD WO CONTRAST  Result Date: 06/20/2020 CLINICAL DATA:  Stroke workup EXAM: MRA HEAD WITHOUT CONTRAST TECHNIQUE:  Angiographic images of the Circle of Willis were obtained using MRA technique without intravenous contrast. COMPARISON:  CTA head neck 03/26/2018 FINDINGS: Dominant left vertebral artery. Patent bilateral PICA with non covered right-sided origin. The basilar is smooth and widely patent. Unremarkable superior cerebellar (early branching on the left) and posterior cerebral arteries. Smooth and widely patent internal carotid arteries and their branches. Negative for aneurysm or vascular malformation. Chronic microhemorrhages seen in the pons. IMPRESSION: Normal intracranial MRA. Electronically Signed   By: Monte Fantasia M.D.   On: 06/20/2020 09:46   MR BRAIN WO CONTRAST  Result Date: 06/19/2020 CLINICAL DATA:  Code stroke follow-up  EXAM: MRI HEAD WITHOUT CONTRAST TECHNIQUE: Multiplanar, multiecho pulse sequences of the brain and surrounding structures were obtained without intravenous contrast. COMPARISON:  05/01/2020 FINDINGS: DWI, SWAN, axial T2 FLAIR, and sagittal T1 sequences were obtained. Additional sequences were not performed at the request of ordering physician. Brain: Small subcentimeter focus of diffusion hyperintensity at the left lateral aspect of the pons with corresponding ADC isointensity. Patchy and confluent areas of T2 hyperintensity in the supratentorial and pontine white matter are nonspecific but probably reflect stable chronic microvascular ischemic changes. There are chronic small vessel infarcts of the thalamus bilaterally, white matter adjacent to the right frontal horn, and right cerebellum. Multiple foci of susceptibility hypointensity are present with involvement of the pons, medulla, thalamus, basal ganglia, and cerebral white matter consistent with chronic microhemorrhages likely secondary to hypertension. Ventricles are stable in size. There is no intracranial mass or mass effect. No extra-axial fluid collection. Vascular: Major vessel flow voids at the skull base are preserved.  Skull and upper cervical spine: Normal marrow signal is preserved. Sinuses/Orbits: Paranasal sinuses are aerated. Orbits are unremarkable. Other: Sella is unremarkable. Mastoid air cells are clear. Right frontal scalp lipoma. IMPRESSION: Subcentimeter likely subacute infarct at the left lateral aspect of the pons. Stable chronic findings detailed above including multiple small infarcts, chronic microvascular ischemic changes, and likely hypertensive chronic microhemorrhages. These results were discussed by telephone at the time of interpretation on 06/19/2020 at 6:30 pm with provider [facility] , who verbally acknowledged these results. Electronically Signed   By: Macy Mis M.D.   On: 06/19/2020 18:47   DG CHEST PORT 1 VIEW  Result Date: 06/19/2020 CLINICAL DATA:  Pain status post fall EXAM: PORTABLE CHEST 1 VIEW COMPARISON:  June 07, 2020 FINDINGS: The heart size and mediastinal contours are within normal limits. Both lungs are clear. The visualized skeletal structures are unremarkable. IMPRESSION: No active disease. Electronically Signed   By: Constance Holster M.D.   On: 06/19/2020 21:45   ECHOCARDIOGRAM COMPLETE  Result Date: 06/21/2020    ECHOCARDIOGRAM REPORT   Patient Name:   AZALYA GALYON Date of Exam: 06/21/2020 Medical Rec #:  [id number]        Height:       62.0 in Accession #:    [id number]       Weight:       154.1 lb Date of Birth:  Dec 27, 1970       BSA:          35.711 m Patient Age:    77 years         BP:           166/86 mmHg Patient Gender: F                HR:           90 bpm. Exam Location:  Inpatient Procedure: 2D Echo, Color Doppler and Cardiac Doppler Indications:    Stroke i163.9  History:        Patient has no prior history of Echocardiogram examinations.                 Risk Factors:Hypertension, Diabetes and Dyslipidemia.  Sonographer:    Raquel Sarna Senior RDCS Referring Phys: [place]  1. Left ventricular ejection fraction, by estimation, is  70 to 75%. The left ventricle has hyperdynamic function. The left ventricle has no regional wall motion abnormalities. There is moderate concentric left ventricular hypertrophy and severe basal septal hypertrophy. Left ventricular diastolic parameters  were normal.  2. Right ventricular systolic function is normal. The right ventricular size is normal. Tricuspid regurgitation signal is inadequate for assessing PA pressure.  3. A small pericardial effusion is present. The pericardial effusion is posterior to the left ventricle.  4. The mitral valve is normal in structure. No evidence of mitral valve regurgitation. No evidence of mitral stenosis.  5. The aortic valve is normal in structure. Aortic valve regurgitation is trivial. No aortic stenosis is present.  6. The inferior vena cava is normal in size with greater than 50% respiratory variability, suggesting right atrial pressure of 3 mmHg. FINDINGS  Left Ventricle: Left ventricular ejection fraction, by estimation, is 70 to 75%. The left ventricle has hyperdynamic function. The left ventricle has no regional wall motion abnormalities. The left ventricular internal cavity size was normal in size. There is moderate concentric left ventricular hypertrophy. Left ventricular diastolic parameters were normal. Normal left ventricular filling pressure. Right Ventricle: The right ventricular size is normal. No increase in right ventricular wall thickness. Right ventricular systolic function is normal. Tricuspid regurgitation signal is inadequate for assessing PA pressure. Left Atrium: Left atrial size was normal in size. Right Atrium: Right atrial size was normal in size. Pericardium: A small pericardial effusion is present. The pericardial effusion is posterior to the left ventricle. Mitral Valve: The mitral valve is normal in structure. No evidence of mitral valve regurgitation. No evidence of mitral valve stenosis. Tricuspid Valve: The tricuspid valve is normal in  structure. Tricuspid valve regurgitation is not demonstrated. No evidence of tricuspid stenosis. Aortic Valve: The aortic valve is normal in structure. Aortic valve regurgitation is trivial. No aortic stenosis is present. Pulmonic Valve: The pulmonic valve was normal in structure. Pulmonic valve regurgitation is not visualized. No evidence of pulmonic stenosis. Aorta: The aortic root is normal in size and structure. Venous: The inferior vena cava is normal in size with greater than 50% respiratory variability, suggesting right atrial pressure of 3 mmHg. IAS/Shunts: No atrial level shunt detected by color flow Doppler.  LEFT VENTRICLE PLAX 2D LVIDd:         2.90 cm  Diastology LVIDs:         1.70 cm  LV e' medial:    11.20 cm/s LV PW:         1.70 cm  LV E/e' medial:  5.3 LV IVS:        1.40 cm  LV e' lateral:   10.20 cm/s LVOT diam:     2.00 cm  LV E/e' lateral: 5.8 LV SV:         55 LV SV Index:   32 LVOT Area:     3.14 cm  RIGHT VENTRICLE RV S prime:     12.50 cm/s TAPSE (M-mode): 2.9 cm LEFT ATRIUM           Index       RIGHT ATRIUM           Index LA diam:      2.90 cm 1.69 cm/m  RA Area:     11.30 cm LA Vol (A2C): 26.8 ml 15.66 ml/m RA Volume:   22.40 ml  13.09 ml/m LA Vol (A4C): 40.3 ml 23.55 ml/m  AORTIC VALVE LVOT Vmax:   92.80 cm/s LVOT Vmean:  63.700 cm/s LVOT VTI:    0.176 m  AORTA Ao Root diam: 2.80 cm Ao Asc diam:  3.10 cm MITRAL VALVE MV Area (PHT): 2.54 cm    SHUNTS MV Decel Time:  299 msec    Systemic VTI:  0.18 m MV E velocity: 59.50 cm/s  Systemic Diam: 2.00 cm MV A velocity: 65.60 cm/s MV E/A ratio:  0.91 Fransico Him MD Electronically signed by Fransico Him MD Signature Date/Time: 06/21/2020/11:03:59 AM    Final    CT HEAD CODE STROKE WO CONTRAST  Result Date: 06/19/2020 CLINICAL DATA:  Code stroke. EXAM: CT HEAD WITHOUT CONTRAST TECHNIQUE: Contiguous axial images were obtained from the base of the skull through the vertex without intravenous contrast. COMPARISON:  09/17/2019, MRI  05/01/2020 FINDINGS: Brain: No acute intracranial hemorrhage, mass effect, or edema. No new loss of gray-white differentiation. Chronic infarcts of the right frontal white matter adjacent to the frontal horn, bilateral thalamus, and left caudothalamic groove. Ventricles and sulci are normal in size and configuration. There is no extra-axial collection. Vascular: No hyperdense vessel. Minimal intracranial atherosclerotic calcification at the skull base. Skull: Unremarkable. Sinuses/Orbits: No acute finding. Other: Small right frontal scalp lipoma. Mastoid air cells are clear. ASPECTS ([place] Stroke Program Early CT Score) - Ganglionic level infarction (caudate, lentiform nuclei, internal capsule, insula, M1-M3 cortex): 7 - Supraganglionic infarction (M4-M6 cortex): 3 Total score (0-10 with 10 being normal): 10 IMPRESSION: There is no acute intracranial hemorrhage or evidence of acute infarction. ASPECT score is 10. These results were communicated to Dr. Lorrin Goodell at 6:09 pm on 06/19/2020 by text page via the [facility] messaging system. Electronically Signed   By: Macy Mis M.D.   On: 06/19/2020 18:13   VAS US CAROTID (at [facility] and [facility] only)  Result Date: 06/20/2020 Carotid Arterial Duplex Study Indications:       CVA. Risk Factors:      Hypertension, hyperlipidemia. Comparison Study:  07/26/2017 - Bi 1-39% ICA stenosis. Performing Technologist: Oliver Hum RVT  Examination Guidelines: A complete evaluation includes B-mode imaging, spectral Doppler, color Doppler, and power Doppler as needed of all accessible portions of each vessel. Bilateral testing is considered an integral part of a complete examination. Limited examinations for reoccurring indications may be performed as noted.  Right Carotid Findings: +----------+--------+--------+--------+-----------------------+--------+           PSV cm/sEDV cm/sStenosisPlaque Description     Comments  +----------+--------+--------+--------+-----------------------+--------+ CCA Prox  88      14              smooth and heterogenous         +----------+--------+--------+--------+-----------------------+--------+ CCA Distal61      14              smooth and heterogenous         +----------+--------+--------+--------+-----------------------+--------+ ICA Prox  61      12              smooth and heterogenous         +----------+--------+--------+--------+-----------------------+--------+ ICA Distal110     38                                     tortuous +----------+--------+--------+--------+-----------------------+--------+ ECA       63      9                                               +----------+--------+--------+--------+-----------------------+--------+ +----------+--------+-------+--------+-------------------+           PSV cm/sEDV cmsDescribeArm  Pressure (mmHG) +----------+--------+-------+--------+-------------------+ Subclavian140                                        +----------+--------+-------+--------+-------------------+ +---------+--------+--+--------+--+---------+ VertebralPSV cm/s51EDV cm/s11Antegrade +---------+--------+--+--------+--+---------+  Left Carotid Findings: +----------+--------+--------+--------+-----------------------+--------+           PSV cm/sEDV cm/sStenosisPlaque Description     Comments +----------+--------+--------+--------+-----------------------+--------+ CCA Prox  105     17              smooth and heterogenous         +----------+--------+--------+--------+-----------------------+--------+ CCA Distal63      11              smooth and heterogenous         +----------+--------+--------+--------+-----------------------+--------+ ICA Prox  52      14                                              +----------+--------+--------+--------+-----------------------+--------+ ICA Distal101     39                                      tortuous +----------+--------+--------+--------+-----------------------+--------+ ECA       77      6                                               +----------+--------+--------+--------+-----------------------+--------+ +----------+--------+--------+--------+-------------------+           PSV cm/sEDV cm/sDescribeArm Pressure (mmHG) +----------+--------+--------+--------+-------------------+ Subclavian185                                         +----------+--------+--------+--------+-------------------+ +---------+--------+--+--------+--+---------+ VertebralPSV cm/s43EDV cm/s12Antegrade +---------+--------+--+--------+--+---------+   Summary: Right Carotid: Velocities in the right ICA are consistent with a 1-39% stenosis. Left Carotid: Velocities in the left ICA are consistent with a 1-39% stenosis. Vertebrals: Bilateral vertebral arteries demonstrate antegrade flow. *See table(s) above for measurements and observations.  Electronically signed by Monica Martinez MD on 06/20/2020 at 1:19:27 PM.    Final         Scheduled Meds: . aspirin EC  81 mg Oral Daily  . atorvastatin  80 mg Oral Daily  . carvedilol  12.5 mg Oral BID WC  . DULoxetine  60 mg Oral Daily  . insulin aspart  0-9 Units Subcutaneous Q4H  . pantoprazole  40 mg Oral Daily  . pregabalin  50 mg Oral QHS  . sodium chloride flush  3 mL Intravenous Once  . ticagrelor  90 mg Oral BID   Continuous Infusions: . levETIRAcetam 500 mg (06/21/20 1032)     LOS: 0 days    Time spent: 35 minutes.     Elmarie Shiley, MD [facility]   If 7PM-7AM, please contact night-coverage [email or web address]  06/21/2020, 1:48 PM

## 2020-06-21 NOTE — Progress Notes (Signed)
Modified Barium Swallow Progress Note  Patient Details  Name: Christina Orozco MRN: [id number] Date of Birth: [birth year]  Today's Date: 06/21/2020  Modified Barium Swallow completed.  Full report located under Chart Review in the Imaging Section.  Brief recommendations include the following:  Clinical Impression  Pt demonstrates mild pharyngeal dysphagia though no penetration or aspiration was observed on this study. Pt was able to drink thin liquids consecutively with a straw this study without triggering a cough. There were occasional instances of a slight delay in initiation. Trialed a chin tuck given that pt is likely to stil have intermittent sensed penetration events, which did seem to provide increased timely airway protection. Pt may resume thin liquids and regular solids. Of greatest concern on this study was the appearance of irregular textured tissue in the valleculae on the base of tongue which did not significantly impact swallowing but needs further imaging. Reported finding to the MD.   Swallow Evaluation Recommendations       SLP Diet Recommendations: Regular solids;Thin liquid   Liquid Administration via: Cup;Straw   Medication Administration: Whole meds with liquid   Supervision: Patient able to self feed   Compensations: Slow rate;Small sips/bites;Chin tuck              Herbie [place], [place] CCC-SLP  Acute Rehabilitation Services Pager [phone] Office [phone]   Lynann Beaver 06/21/2020,12:19 PM

## 2020-06-21 NOTE — Consult Note (Signed)
Physical Medicine and Rehabilitation Consult Reason for Consult: Right side weakness Referring Physician: [facility]   HPI: Christina Orozco is a 50 y.o. right-handed female with history of hypertension, severe LVH, diabetes mellitus, seizure disorder maintained on Keppra, CKD stage IV, asthma and recurrent lacunar infarcts maintained on aspirin and Plavix followed by Dr Doy Hutching at [facility].  And loop recorder 2019.  Per chart review patient lives with her 26 year old daughter and 26-year-old grandson independent prior to admission.  1 level apartment.  Presented 06/19/2020 with right side weakness and slurred speech.  Systolic blood pressure in the 240s.  CT/MRI showed subcentimeter likely subacute infarct of the left lateral aspect of the pons.  Stable chronic findings including multiple small infarcts chronic microvascular ischemic changes and likely hypertensive chronic microhemorrhages.  MRA of the head negative for aneurysm or vascular malformation.  Patient did not receive TPA.  Carotid Dopplers with no ICA stenosis.  Admission chemistries unremarkable except sodium 146, BUN 38, creatinine 2.50, troponin negative, CK 153.  Echocardiogram with ejection fraction of 70 to 75% no wall motion abnormalities.  Neurology follow-up work-up ongoing presently maintained on aspirin as well as Brilinta.  Therapy evaluations completed due to right side weakness and aphasia with recommendations of physical medicine rehab consult.   Review of Systems  Constitutional: Negative for chills and fever.  HENT: Negative for hearing loss.   Eyes: Negative for blurred vision and double vision.  Respiratory: Negative for cough and shortness of breath.   Cardiovascular: Positive for leg swelling. Negative for chest pain and palpitations.  Gastrointestinal: Positive for constipation. Negative for heartburn, nausea and vomiting.       GERD  Genitourinary: Negative for dysuria, flank pain and hematuria.   Musculoskeletal: Positive for myalgias.  Skin: Negative for rash.  Neurological: Positive for speech change and weakness.  All other systems reviewed and are negative.  Past Medical History:  Diagnosis Date   Anemia    severe   Asthma    CKD (chronic kidney disease)    stage IV   DDD (degenerative disc disease), cervical    Diabetes mellitus (HCC)    Dislocated shoulder    right   GERD (gastroesophageal reflux disease)    Hypertension    Neuropathy    Stroke [facility])    Past Surgical History:  Procedure Laterality Date   ADENOIDECTOMY     APPENDECTOMY     CERVICAL SPINE SURGERY  06/2012   C5-C6   EYE SURGERY     left eye surgery    FOOT SURGERY     LOOP RECORDER INSERTION N/A 08/27/2017   Procedure: LOOP RECORDER INSERTION;  Surgeon: Sanda Klein, MD;  Location: [facility];  Service: Cardiovascular;  Laterality: N/A;   SHOULDER SURGERY     TEE WITHOUT CARDIOVERSION N/A 08/27/2017   Procedure: TRANSESOPHAGEAL ECHOCARDIOGRAM (TEE);  Surgeon: Sanda Klein, MD;  Location: [facility];  Service: Cardiovascular;  Laterality: N/A;   TONSILLECTOMY     Family History  Problem Relation Age of Onset   Vascular Disease Mother    CAD Mother    Heart failure Mother    Heart disease Other    Cancer Other        colon, stomach, pancreatic, lung   Diabetes Other    Breast cancer Sister 30   Seizures Maternal Uncle    Social History:  reports that she has never smoked. She has never used smokeless tobacco. She reports that she does not drink  alcohol and does not use drugs. Allergies:  Allergies  Allergen Reactions   Lisinopril Swelling and Other (See Comments)    Facial swelling    Penicillins Hives    Has patient had a PCN reaction causing immediate rash, facial/tongue/throat swelling, SOB or lightheadedness with hypotension: yes Has patient had a PCN reaction causing severe rash involving mucus membranes or skin necrosis: No Has  patient had a PCN reaction that required hospitalization No Has patient had a PCN reaction occurring within the last 10 years: No If all of the above answers are "NO", then may proceed wit   Gabapentin Swelling and Other (See Comments)    "I thought I was having another stroke."    Facility-Administered Medications Prior to Admission  Medication Dose Route Frequency Provider Last Rate Last Admin   lidocaine-EPINEPHrine (XYLOCAINE W/EPI) 1 %-1:100000 (with pres) injection 10 mL  10 mL Infiltration Once Croitoru, Mihai, MD       Medications Prior to Admission  Medication Sig Dispense Refill   albuterol (PROAIR HFA) 108 (90 Base) MCG/ACT inhaler INHALE 1 OR 2 PUFFS INTO THE LUNGS EVERY 6 HOURS AS NEEDED FOR WHEEZING OR SHORTNESS OF BREATH (Patient taking differently: Inhale 1-2 puffs into the lungs every 6 (six) hours as needed for shortness of breath or wheezing.) 8.5 g 0   amLODipine (NORVASC) 10 MG tablet TAKE 1 TABLET BY MOUTH EVERY DAY (Patient taking differently: Take 10 mg by mouth daily.) 90 tablet 3   aspirin 81 MG EC tablet Take 81 mg by mouth daily.     atorvastatin (LIPITOR) 80 MG tablet Take 80 mg by mouth at bedtime.     carvedilol (COREG) 25 MG tablet Take 1 tablet (25 mg total) by mouth 2 (two) times daily with a meal. Take 25 mg twice daily 180 tablet 3   Cholecalciferol (VITAMIN D3 PO) Take 2,000 mg by mouth daily.     clopidogrel (PLAVIX) 75 MG tablet Take 1 tablet (75 mg total) by mouth daily. (Patient taking differently: Take 75 mg by mouth at bedtime.) 90 tablet 1   doxazosin (CARDURA) 4 MG tablet TAKE 1 TABLET BY MOUTH EVERY DAY (Patient taking differently: Take 4 mg by mouth daily.) 90 tablet 3   DULoxetine (CYMBALTA) 60 MG capsule Take 60 mg by mouth at bedtime.     Eszopiclone 3 MG TABS Take 3 mg by mouth at bedtime. Take immediately before bedtime     ferrous sulfate 325 (65 FE) MG tablet Take 1 tablet (325 mg total) by mouth daily. 30 tablet 3    furosemide (LASIX) 20 MG tablet Take 40 mg by mouth daily as needed for fluid.     glucose blood (CONTOUR NEXT TEST) test strip 1 each by Other route 2 (two) times daily. And lancets 2/day 100 each 12   hydrALAZINE (APRESOLINE) 25 MG tablet Take 25 mg by mouth every 8 (eight) hours as needed. FOR BLOOD PRESSURE ABOVE 150     levETIRAcetam (KEPPRA) 500 MG tablet TAKE 1 TABLET(500 MG) BY MOUTH TWICE DAILY (Patient taking differently: Take 500 mg by mouth 2 (two) times daily. TAKE 1 TABLET(500 MG) BY MOUTH TWICE DAILY) 60 tablet 5   meclizine (ANTIVERT) 25 MG tablet TAKE 1 TABLET(25 MG) BY MOUTH THREE TIMES DAILY AS NEEDED FOR DIZZINESS (Patient taking differently: Take 25 mg by mouth 3 (three) times daily as needed for dizziness.) 90 tablet 0   pantoprazole (PROTONIX) 40 MG tablet TAKE 1 TABLET(40 MG) BY MOUTH DAILY (Patient taking  differently: Take 40 mg by mouth daily.) 30 tablet 5   pregabalin (LYRICA) 50 MG capsule Take 1 capsule by mouth at bedtime.     Semaglutide,0.25 or 0.5MG /DOS, (OZEMPIC, 0.25 OR 0.5 MG/DOSE,) 2 MG/1.5ML SOPN Inject 0.5 mg into the skin every Sunday.     tiZANidine (ZANAFLEX) 2 MG tablet Take 1-2 mg by mouth every 8 (eight) hours as needed for muscle spasms.     topiramate (TOPAMAX) 25 MG tablet Take 50 mg by mouth at bedtime.      Home: Home Living Family/patient expects to be discharged to:: Private residence Living Arrangements: Children Available Help at Discharge: Family,Available PRN/intermittently Type of Home: Apartment Home Access: Level entry [place]: One level Bathroom Shower/Tub: Chiropodist: [place]: Environmental consultant - 2 wheels,Cane - single point,Hand held shower head,Shower seat Additional Comments: pt lives with her 20 yo daughter and 62 yo grandson  Functional History: Prior Function Level of Independence: Independent Comments: does not work anymore, has been trying to get disability Functional Status:   Mobility: Bed Mobility Overal bed mobility: Needs Assistance Bed Mobility: Supine to Sit Supine to sit: Supervision General bed mobility comments: Supervision for safety. Pt requiring increased time due to dizziness and fatigue Transfers Overall transfer level: Needs assistance Equipment used: Rolling walker (2 wheeled),None Transfers: Sit to/from Stand Sit to Stand: Min guard Stand pivot transfers: Min assist General transfer comment: Min Guard A for safety with standing Ambulation/Gait General Gait Details: NT due to light headed    ADL: ADL Overall ADL's : Needs assistance/impaired Eating/Feeding: Set up,Sitting Grooming: Oral care,Min guard,Minimal assistance,Standing,Wash/dry hands Grooming Details (indicate cue type and reason): Pt performing oral care at sink with MIn guard A for safety; heavy leaning on elbow for support and balance. When standing upright without UE support, pt with posterior lean and LOB requiring Min A for correction and fall prevention. Upper Body Bathing: Set up,Supervision/ safety Lower Body Bathing: Minimal assistance,Sit to/from stand Upper Body Dressing : Set up,Supervision/safety,Sitting Lower Body Dressing: Minimal assistance,Sit to/from stand Toilet Transfer: Minimal assistance,Ambulation,RW (simulated to recliner) Functional mobility during ADLs: Minimal assistance,Rolling walker General ADL Comments: Pt presenting with decreased balance, coorindation, and strength. Pt reporting consistent dizziness when moving and upright. VSS  Cognition: Cognition Overall Cognitive Status: Impaired/Different from baseline Orientation Level: Oriented X4 Cognition Arousal/Alertness: Awake/alert Behavior During Therapy: Flat affect Overall Cognitive Status: Impaired/Different from baseline Area of Impairment: Awareness,Problem solving,Safety/judgement Safety/Judgement: Decreased awareness of deficits,Decreased awareness of safety Awareness:  Emergent Problem Solving: Slow processing,Decreased initiation,Difficulty sequencing,Requires verbal cues,Requires tactile cues General Comments: pt with delayed response time. Very flat and offers little info unless asked. Several times seems to "zone out" of the conversation.  Blood pressure (!) 149/86, pulse 79, temperature 98.3 F (36.8 C), temperature source Oral, resp. rate 16, height 5\' 2"  (1.575 m), weight 69.9 kg, last menstrual period 05/22/2018, SpO2 100 %. Physical Exam Constitutional:      Appearance: Normal appearance.  HENT:     Head: Normocephalic and atraumatic.     Right Ear: External ear normal.     Left Ear: External ear normal.     Nose: Nose normal.     Mouth/Throat:     Mouth: Mucous membranes are moist.     Pharynx: Oropharynx is clear.  Eyes:     Pupils: Pupils are equal, round, and reactive to light.  Cardiovascular:     Rate and Rhythm: Normal rate and regular rhythm.     Pulses: Normal pulses.  Heart sounds: No murmur heard.   Pulmonary:     Effort: Pulmonary effort is normal. No respiratory distress.  Abdominal:     Palpations: Abdomen is soft.  Musculoskeletal:     Cervical back: Normal range of motion.  Skin:    General: Skin is warm.  Neurological:     Mental Status: She is alert.     Comments: Patient is alert makes eye contact with examiner.  Provides her name and age.  Follows simple commands. Alert and oriented x 3. Normal insight and awareness. Intact Memory. Normal language and speech. Cranial nerve exam unremarkable. RUE 4+/5. LUE 5/5. Mild decrease in [facility] RUE. RLE 1/5. ? Effort. LLE 5/5. Decreased LT outer right leg and foot, less so LLE. DTR's 1+.   Psychiatric:        Mood and Affect: Mood normal.        Thought Content: Thought content normal.     Results for orders placed or performed during the hospital encounter of 06/19/20 (from the past 24 hour(s))  Glucose, capillary     Status: Abnormal   Collection Time: 06/21/20  3:59  PM  Result Value Ref Range   Glucose-Capillary 124 (H) 70 - 99 mg/dL  Glucose, capillary     Status: Abnormal   Collection Time: 06/21/20  7:27 PM  Result Value Ref Range   Glucose-Capillary 110 (H) 70 - 99 mg/dL   Comment 1 Notify RN    Comment 2 Document in Chart   Glucose, capillary     Status: Abnormal   Collection Time: 06/21/20 11:26 PM  Result Value Ref Range   Glucose-Capillary 116 (H) 70 - 99 mg/dL   Comment 1 Notify RN    Comment 2 Document in Chart   Glucose, capillary     Status: None   Collection Time: 06/22/20  3:17 AM  Result Value Ref Range   Glucose-Capillary 82 70 - 99 mg/dL   Comment 1 Notify RN    Comment 2 Document in Chart   Glucose, capillary     Status: None   Collection Time: 06/22/20 12:14 PM  Result Value Ref Range   Glucose-Capillary 99 70 - 99 mg/dL   CT SOFT TISSUE NECK WO CONTRAST  Result Date: 06/21/2020 CLINICAL DATA:  Tongue mass EXAM: CT NECK WITHOUT CONTRAST TECHNIQUE: Multidetector CT imaging of the neck was performed following the standard protocol without intravenous contrast. COMPARISON:  CT angio head and neck 03/25/2018 FINDINGS: Pharynx and larynx: Tongue evaluation limited without intravenous contrast. No tongue mass identified. Recommend correlation with mucosal examination. The adenoid and palatine tonsils are mildly prominent. The lingual tonsils are also mildly prominent and symmetric. Normal larynx and epiglottis. Salivary glands: No inflammation, mass, or stone. Thyroid: Normal size.  No thyroid nodule. Lymph nodes: No enlarged cervical lymph nodes. Vascular: Limited evaluation without intravenous contrast. Limited intracranial: Negative Visualized orbits: Negative Mastoids and visualized paranasal sinuses: Paranasal sinuses clear. Visualized mastoid clear bilaterally. Skeleton: No acute abnormality.  ACDF C5-6 with solid fusion. Upper chest: Lung apices clear bilaterally. Other: None IMPRESSION: No tongue mass identified. Limited  evaluation without intravenous contrast. Correlate with mucosal evaluation Prominent tonsils and adenoid likely hypertrophy. Correlate with mucosal evaluation. No adenopathy in the neck. Electronically Signed   By: Franchot Gallo M.D.   On: 06/21/2020 14:02   ECHOCARDIOGRAM COMPLETE  Result Date: 06/21/2020    ECHOCARDIOGRAM REPORT   Patient Name:   CHERYLLYNN SARFF Date of Exam: 06/21/2020 Medical Rec #:  [id number]  Height:       62.0 in Accession #:    [id number]       Weight:       154.1 lb Date of Birth:  February 26, 1971       BSA:          64.711 m Patient Age:    5 years         BP:           166/86 mmHg Patient Gender: F                HR:           90 bpm. Exam Location:  Inpatient Procedure: 2D Echo, Color Doppler and Cardiac Doppler Indications:    Stroke i163.9  History:        Patient has no prior history of Echocardiogram examinations.                 Risk Factors:Hypertension, Diabetes and Dyslipidemia.  Sonographer:    Raquel Sarna Senior RDCS Referring Phys: [place]  1. Left ventricular ejection fraction, by estimation, is 70 to 75%. The left ventricle has hyperdynamic function. The left ventricle has no regional wall motion abnormalities. There is moderate concentric left ventricular hypertrophy and severe basal septal hypertrophy. Left ventricular diastolic parameters were normal.  2. Right ventricular systolic function is normal. The right ventricular size is normal. Tricuspid regurgitation signal is inadequate for assessing PA pressure.  3. A small pericardial effusion is present. The pericardial effusion is posterior to the left ventricle.  4. The mitral valve is normal in structure. No evidence of mitral valve regurgitation. No evidence of mitral stenosis.  5. The aortic valve is normal in structure. Aortic valve regurgitation is trivial. No aortic stenosis is present.  6. The inferior vena cava is normal in size with greater than 50% respiratory variability,  suggesting right atrial pressure of 3 mmHg. FINDINGS  Left Ventricle: Left ventricular ejection fraction, by estimation, is 70 to 75%. The left ventricle has hyperdynamic function. The left ventricle has no regional wall motion abnormalities. The left ventricular internal cavity size was normal in size. There is moderate concentric left ventricular hypertrophy. Left ventricular diastolic parameters were normal. Normal left ventricular filling pressure. Right Ventricle: The right ventricular size is normal. No increase in right ventricular wall thickness. Right ventricular systolic function is normal. Tricuspid regurgitation signal is inadequate for assessing PA pressure. Left Atrium: Left atrial size was normal in size. Right Atrium: Right atrial size was normal in size. Pericardium: A small pericardial effusion is present. The pericardial effusion is posterior to the left ventricle. Mitral Valve: The mitral valve is normal in structure. No evidence of mitral valve regurgitation. No evidence of mitral valve stenosis. Tricuspid Valve: The tricuspid valve is normal in structure. Tricuspid valve regurgitation is not demonstrated. No evidence of tricuspid stenosis. Aortic Valve: The aortic valve is normal in structure. Aortic valve regurgitation is trivial. No aortic stenosis is present. Pulmonic Valve: The pulmonic valve was normal in structure. Pulmonic valve regurgitation is not visualized. No evidence of pulmonic stenosis. Aorta: The aortic root is normal in size and structure. Venous: The inferior vena cava is normal in size with greater than 50% respiratory variability, suggesting right atrial pressure of 3 mmHg. IAS/Shunts: No atrial level shunt detected by color flow Doppler.  LEFT VENTRICLE PLAX 2D LVIDd:         2.90 cm  Diastology LVIDs:  1.70 cm  LV e' medial:    11.20 cm/s LV PW:         1.70 cm  LV E/e' medial:  5.3 LV IVS:        1.40 cm  LV e' lateral:   10.20 cm/s LVOT diam:     2.00 cm  LV E/e'  lateral: 5.8 LV SV:         55 LV SV Index:   32 LVOT Area:     3.14 cm  RIGHT VENTRICLE RV S prime:     12.50 cm/s TAPSE (M-mode): 2.9 cm LEFT ATRIUM           Index       RIGHT ATRIUM           Index LA diam:      2.90 cm 1.69 cm/m  RA Area:     11.30 cm LA Vol (A2C): 26.8 ml 15.66 ml/m RA Volume:   22.40 ml  13.09 ml/m LA Vol (A4C): 40.3 ml 23.55 ml/m  AORTIC VALVE LVOT Vmax:   92.80 cm/s LVOT Vmean:  63.700 cm/s LVOT VTI:    0.176 m  AORTA Ao Root diam: 2.80 cm Ao Asc diam:  3.10 cm MITRAL VALVE MV Area (PHT): 2.54 cm    SHUNTS MV Decel Time: 299 msec    Systemic VTI:  0.18 m MV E velocity: 59.50 cm/s  Systemic Diam: 2.00 cm MV A velocity: 65.60 cm/s MV E/A ratio:  0.91 Fransico Him MD Electronically signed by Fransico Him MD Signature Date/Time: 06/21/2020/11:03:59 AM    Final     Assessment/Plan: Diagnosis: left pontine infarct 1. Does the need for close, 24 hr/day medical supervision in concert with the patient's rehab needs make it unreasonable for this patient to be served in a less intensive setting? Yes and Potentially 2. Co-Morbidities requiring supervision/potential complications: HTN, DM, sz disorder, CKD 3. Due to bladder management, bowel management, safety, skin/wound care, disease management, medication administration, pain management and patient education, does the patient require 24 hr/day rehab nursing? Yes 4. Does the patient require coordinated care of a physician, rehab nurse, therapy disciplines of PT, OT to address physical and functional deficits in the context of the above medical diagnosis(es)? Yes Addressing deficits in the following areas: balance, endurance, locomotion, strength, transferring, bowel/bladder control, bathing, dressing, feeding, grooming, toileting and psychosocial support 5. Can the patient actively participate in an intensive therapy program of at least 3 hrs of therapy per day at least 5 days per week? Yes 6. The potential for patient to make  measurable gains while on inpatient rehab is excellent 7. Anticipated functional outcomes upon discharge from inpatient rehab are modified independent  with PT, modified independent with OT, n/a with SLP. 8. Estimated rehab length of stay to reach the above functional goals is: 7-10 days 9. Anticipated discharge destination: Home 10. Overall Rehab/Functional Prognosis: excellent  RECOMMENDATIONS: This patient's condition is appropriate for continued rehabilitative care in the following setting: CIR Patient has agreed to participate in recommended program. No and Potentially Note that insurance prior authorization may be required for reimbursement for recommended care.  Comment: Pt told me that she did "much better" with therapy today and that the therapist told her she could go home. This is surprising given the amount of weakness still present in the RLE. She has LE sensory loss also which may be more related to her DM than anything else. Pt states that she's been to inpatient rehab before and  knows what we do. She thinks that she and her daughter can manage. Will await today's therapy report, and rehab admissions coordinator can f/u.  Thanks,        Meredith Staggers, MD 06/22/2020

## 2020-06-21 NOTE — Evaluation (Signed)
Occupational Therapy Evaluation Patient Details Name: Christina Orozco MRN: [id number] DOB: 11/21/1970 Today's Date: 06/21/2020    History of Present Illness Pt is 50 yo female with PMH: DM, seizures 2019, HTN, GERD, CKD IV, asthma, neuropathy, anemia, CVA R thalamus 2017 who presents with R sided weakness, aphasia, and episode of altered consciousness where she fell. MRI shows subacute pontine infarcts. Pt reports she has had several other episodes recently of R sided weakness and times that she tried to speak and couldn't. Came to ED shortly after Christmas for this and states that she waited 6 hrs and wasn't seen so went home.   Clinical Impression   PTA, pt was living with her daughter and grandson and was independent. Pt currently requiring Min A for ADLs and functional mobility with RW. Pt presenting with poor coordination/strength at RUE, balance, strength, and activity tolerance due to dizziness and fatigue with mobility. VSS and BP 154/85 (105) after mobility. Pt with LOB standing at sink and requiring physical A to correct and prevent fall. Pt would benefit from further acute OT to facilitate safe dc. Recommend dc to CIR intensive OT to optimize safety, independence with ADLs/IADLs, and return to PLOF for returning home with children.     Follow Up Recommendations  CIR;Supervision/Assistance - 24 hour    Equipment Recommendations  3 in 1 bedside commode    Recommendations for Other Services PT consult;Rehab consult     Precautions / Restrictions Precautions Precautions: Fall Precaution Comments: poor R sided coordination      Mobility Bed Mobility Overal bed mobility: Needs Assistance Bed Mobility: Supine to Sit     Supine to sit: Supervision     General bed mobility comments: Supervision for safety. Pt requiring increased time due to dizziness and fatigue    Transfers Overall transfer level: Needs assistance Equipment used: Rolling walker (2  wheeled);None Transfers: Sit to/from Stand Sit to Stand: Min guard         General transfer comment: Min Guard A for safety with standing    Balance Overall balance assessment: Needs assistance Sitting-balance support: Feet supported;No upper extremity supported Sitting balance-Leahy Scale: Fair     Standing balance support: Bilateral upper extremity supported;No upper extremity supported;During functional activity Standing balance-Leahy Scale: Poor Standing balance comment: Reliant on UE support; LOB while standing at sink adn requiring Min A for fall prevention                           ADL either performed or assessed with clinical judgement   ADL Overall ADL's : Needs assistance/impaired Eating/Feeding: Set up;Sitting   Grooming: Oral care;Min guard;Minimal assistance;Standing;Wash/dry hands Grooming Details (indicate cue type and reason): Pt performing oral care at sink with MIn guard A for safety; heavy leaning on elbow for support and balance. When standing upright without UE support, pt with posterior lean and LOB requiring Min A for correction and fall prevention. Upper Body Bathing: Set up;Supervision/ safety   Lower Body Bathing: Minimal assistance;Sit to/from stand   Upper Body Dressing : Set up;Supervision/safety;Sitting   Lower Body Dressing: Minimal assistance;Sit to/from stand   Toilet Transfer: Minimal assistance;Ambulation;RW (simulated to recliner)           Functional mobility during ADLs: Minimal assistance;Rolling walker General ADL Comments: Pt presenting with decreased balance, coorindation, and strength. Pt reporting consistent dizziness when moving and upright. VSS     Vision         Perception  Praxis      Pertinent Vitals/Pain Pain Assessment: No/denies pain     Hand Dominance Right   Extremity/Trunk Assessment Upper Extremity Assessment Upper Extremity Assessment: RUE deficits/detail RUE Deficits / Details:  Decreased grasp strength and coorindation compared to L RUE Coordination: decreased fine motor;decreased gross motor   Lower Extremity Assessment Lower Extremity Assessment: Defer to PT evaluation   Cervical / Trunk Assessment Cervical / Trunk Assessment: Normal   Communication     Cognition Arousal/Alertness: Awake/alert Behavior During Therapy: Flat affect Overall Cognitive Status: Impaired/Different from baseline Area of Impairment: Awareness;Problem solving;Safety/judgement                         Safety/Judgement: Decreased awareness of deficits;Decreased awareness of safety Awareness: Emergent Problem Solving: Slow processing;Decreased initiation;Difficulty sequencing;Requires verbal cues;Requires tactile cues General Comments: pt with delayed response time. Very flat and offers little info unless asked. Several times seems to "zone out" of the conversation.   General Comments  BP 154/85 (105) after mobility. SpO2 100% on RA. Pulse 88.    Exercises     Shoulder Instructions      Home Living Family/patient expects to be discharged to:: Private residence Living Arrangements: Children Available Help at Discharge: Family;Available PRN/intermittently Type of Home: Apartment Home Access: Level entry     Home Layout: One level     Bathroom Shower/Tub: Teacher, early years/pre: Standard     Home Equipment: Environmental consultant - 2 wheels;Cane - single point;Hand held shower head;Shower seat   Additional Comments: pt lives with her 19 yo daughter and 60 yo grandson      Prior Functioning/Environment Level of Independence: Independent        Comments: does not work anymore, has been trying to get disability        OT Problem List: Decreased strength;Decreased activity tolerance;Decreased range of motion;Impaired balance (sitting and/or standing);Decreased knowledge of use of DME or AE;Decreased knowledge of precautions      OT Treatment/Interventions:  Self-care/ADL training;Therapeutic exercise;Energy conservation;DME and/or AE instruction;Therapeutic activities;Patient/family education    OT Goals(Current goals can be found in the care plan section) Acute Rehab OT Goals Patient Stated Goal: Go home OT Goal Formulation: With patient Time For Goal Achievement: 07/05/20 Potential to Achieve Goals: Good  OT Frequency: Min 2X/week   Barriers to D/C: Decreased caregiver support  Decreased support; pt is main caregiver for 98 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 and 75 yo grandson       Co-evaluation              AM-PAC OT "6 Clicks" Daily Activity     Outcome Measure Help from another person eating meals?: None Help from another person taking care of personal grooming?: A Little Help from another person toileting, which includes using toliet, bedpan, or urinal?: A Little Help from another person bathing (including washing, rinsing, drying)?: A Little Help from another person to put on and taking off regular upper body clothing?: A Little Help from another person to put on and taking off regular lower body clothing?: A Little 6 Click Score: 19   End of Session Equipment Utilized During Treatment: Gait belt;Rolling walker Nurse Communication: Mobility status  Activity Tolerance: Other (comment);Patient limited by fatigue (Limited by dizziness) Patient left: in chair;with call bell/phone within reach;with chair alarm set  OT Visit Diagnosis: Unsteadiness on feet (R26.81);Other abnormalities of gait and mobility (R26.89);Muscle weakness (generalized) (M62.81);Dizziness and giddiness (R42)  Time: 5852-7782 OT Time Calculation (min): 24 min Charges:  OT General Charges $OT Visit: 1 Visit OT Evaluation $OT Eval Moderate Complexity: 1 Mod OT Treatments $Self Care/Home Management : 8-22 mins  Samier Jaco MSOT, OTR/L Acute Rehab Pager: [phone] Office: [place] 06/21/2020, 4:38 PM

## 2020-06-21 NOTE — Evaluation (Signed)
Physical Therapy Evaluation Patient Details Name: Christina Orozco MRN: [id number] DOB: Jul 17, 1970 Today's Date: 06/21/2020   History of Present Illness  Pt is 50 yo female with PMH: DM, seizures 2019, HTN, GERD, CKD IV, asthma, neuropathy, anemia, CVA R thalamus 2017 who presents with R sided weakness, aphasia, and episode of altered consciousness where she fell. MRI shows subacute pontine infarcts. Pt reports she has had several other episodes recently of R sided weakness and times that she tried to speak and couldn't. Came to ED shortly after Christmas for this and states that she waited 6 hrs and wasn't seen so went home.  Clinical Impression  Pt admitted with above diagnosis. Pt relays that she has had several episodes of decreased ability to speak and R sided weakness that have occurred since Christmas. She reports she came to the ED once and waited 6 hours but became very anxious in the waiting room and left without being seen. She states that when she had the episode that brought her in this time that she doesn't think that she fully lost consciousness but her daughter told her that her "eyes rolled back in her head". On eval, pt unable to lift RLE fully extended against gravity but is able with knee flexed, noted 3-/5 hip flex, >3/5 knee ext. No buckling of RLE in standing though hesitant to wt shift to R. Pt light headed with all standing activity so multiple transfers performed but ambulation deferred, will work more tomorrow. Due to current unsteadiness and poor coordination, currently recommending CIR consult with expectation that pt could return to independence. Pt concerned about 17 yo daughter and 37 yo grandson currently home alone.  Pt currently with functional limitations due to the deficits listed below (see PT Problem List). Pt will benefit from skilled PT to increase their independence and safety with mobility to allow discharge to the venue listed below.      Follow Up  Recommendations CIR    Equipment Recommendations  None recommended by PT    Recommendations for Other Services Rehab consult     Precautions / Restrictions Precautions Precautions: Fall Precaution Comments: poor R sided coordination Restrictions Weight Bearing Restrictions: No      Mobility  Bed Mobility Overal bed mobility: Needs Assistance Bed Mobility: Supine to Sit     Supine to sit: Supervision     General bed mobility comments: pt able to sit up into long sitting with use of rail. Slid BLE's off bed slowly but without need for assist of hands. Increased time for all mvmts.    Transfers Overall transfer level: Needs assistance Equipment used: Rolling walker (2 wheeled);None Transfers: Sit to/from [employer] to Stand: Min assist Stand pivot transfers: Min assist       General transfer comment: min A to steady with coming to standing, mild posterior lean. Had difficulty beginning to step feet but after practice was able to take pivot steps to recliner with RW and min A. Pt light headed with all upright activity. Performed sit>stand 3 more times with and without RW.  Ambulation/Gait             General Gait Details: NT due to light headed  Stairs            Wheelchair Mobility    Modified Rankin (Stroke Patients Only) Modified Rankin (Stroke Patients Only) Pre-Morbid Rankin Score: No significant disability Modified Rankin: Moderately severe disability     Balance Overall balance assessment: Needs assistance Sitting-balance support: Feet  supported;No upper extremity supported Sitting balance-Leahy Scale: Fair     Standing balance support: Bilateral upper extremity supported;During functional activity Standing balance-Leahy Scale: Poor Standing balance comment: reliant on UE support                             Pertinent Vitals/Pain Pain Assessment: No/denies pain    Home Living Family/patient expects to  be discharged to:: Private residence Living Arrangements: Children Available Help at Discharge: Family;Available PRN/intermittently Type of Home: Apartment Home Access: Level entry     Home Layout: One level Home Equipment: Walker - 2 wheels;Cane - single point;Hand held shower head;Shower seat Additional Comments: pt lives with her 52 yo daughter and 75 yo grandson    Prior Function Level of Independence: Independent         Comments: does not work anymore, has been trying to get disability     Hand Dominance   Dominant Hand: Right    Extremity/Trunk Assessment   Upper Extremity Assessment Upper Extremity Assessment: Defer to OT evaluation    Lower Extremity Assessment Lower Extremity Assessment: RLE deficits/detail RLE Deficits / Details: in supine pt unable to lift RLE fully extended and minimal activation of hip flexors and quad. 2/5 hip flexion visualized with heel slide and standing marching. df/pf>3/5. Knee ext 3/5, no buckling in standing though pt hesitant to wt shift to R side RLE Sensation: history of peripheral neuropathy;decreased light touch (relays that toes 4 and 5 feel stiff) RLE Coordination: decreased fine motor;decreased gross motor    Cervical / Trunk Assessment Cervical / Trunk Assessment: Normal  Communication   Communication:  (mild slurring and slowed speech)  Cognition Arousal/Alertness: Awake/alert Behavior During Therapy: Flat affect Overall Cognitive Status: Impaired/Different from baseline Area of Impairment: Awareness;Problem solving;Safety/judgement                         Safety/Judgement: Decreased awareness of deficits;Decreased awareness of safety Awareness: Emergent Problem Solving: Slow processing;Decreased initiation;Difficulty sequencing;Requires verbal cues;Requires tactile cues General Comments: pt with delayed response time. Very flat and offers little info unless asked. Several times seems to "zone out" of the  conversation.      General Comments General comments (skin integrity, edema, etc.): pt relays concerns about grandson being mept my her 69 yo daughter who will need to go back to school later this week    Exercises     Assessment/Plan    PT Assessment Patient needs continued PT services  PT Problem List Decreased strength;Decreased activity tolerance;Decreased balance;Decreased mobility;Decreased coordination;Decreased cognition;Decreased knowledge of use of DME;Decreased safety awareness;Decreased knowledge of precautions;Impaired sensation       PT Treatment Interventions DME instruction;Gait training;Stair training;Functional mobility training;Therapeutic activities;Therapeutic exercise;Balance training;Neuromuscular re-education;Patient/family education;Cognitive remediation    PT Goals (Current goals can be found in the Care Plan section)  Acute Rehab PT Goals Patient Stated Goal: return home PT Goal Formulation: With patient Time For Goal Achievement: 07/05/20 Potential to Achieve Goals: Good    Frequency Min 4X/week   Barriers to discharge Decreased caregiver support      Co-evaluation               AM-PAC PT "6 Clicks" Mobility  Outcome Measure Help needed turning from your back to your side while in a flat bed without using bedrails?: None Help needed moving from lying on your back to sitting on the side of a flat bed without using bedrails?:  None Help needed moving to and from a bed to a chair (including a wheelchair)?: A Little Help needed standing up from a chair using your arms (e.g., wheelchair or bedside chair)?: A Little Help needed to walk in hospital room?: A Little Help needed climbing 3-5 steps with a railing? : A Lot 6 Click Score: 19    End of Session Equipment Utilized During Treatment: Gait belt Activity Tolerance: Patient tolerated treatment well Patient left: in chair;with call bell/phone within reach;with chair alarm set Nurse  Communication: Mobility status PT Visit Diagnosis: Hemiplegia and hemiparesis;Difficulty in walking, not elsewhere classified (R26.2) Hemiplegia - Right/Left: Right Hemiplegia - dominant/non-dominant: Dominant Hemiplegia - caused by: Cerebral infarction    Time: 5521-7471 PT Time Calculation (min) (ACUTE ONLY): 37 min   Charges:   PT Evaluation $PT Eval Moderate Complexity: 1 Mod PT Treatments $Therapeutic Activity: 8-22 mins        Leighton Roach, PT  Acute Rehab Services  Pager [phone] Office [place] 06/21/2020, 12:38 PM

## 2020-06-21 NOTE — Progress Notes (Signed)
Echocardiogram 2D Echocardiogram has been performed.  Oneal Deputy Chancy Claros 06/21/2020, 10:07 AM

## 2020-06-21 NOTE — Progress Notes (Signed)
STROKE TEAM PROGRESS NOTE   INTERVAL HISTORY No family is at the bedside.  Pt lying in bed, stated that her right arm much improved but her right leg still weak not able to walk. Loop recorder explanted 07/2019, no afib at that time.   OBJECTIVE Vitals:   06/21/20 0442 06/21/20 0835 06/21/20 1203 06/21/20 1543  BP: (!) 166/86 (!) 167/86 (!) 158/86 (!) 154/85  Pulse: (!) 110 96 82 88  Resp: 16 18 18 18   Temp: 98.7 F (37.1 C) 97.8 F (36.6 C) 98 F (36.7 C) 98.5 F (36.9 C)  TempSrc: Oral Oral Axillary Oral  SpO2: 100% 100% 100% 100%  Weight:      Height:        CBC:  Recent Labs  Lab 06/19/20 1756 06/21/20 0451  WBC 6.7 5.7  NEUTROABS 3.3  --   HGB 10.5* 9.2*  HCT 31.5* 27.8*  MCV 89.2 88.0  PLT 257 470    Basic Metabolic Panel:  Recent Labs  Lab 06/19/20 1756 06/21/20 0451  NA 143 145  K 4.0 3.4*  CL 111 114*  CO2 20* 20*  GLUCOSE 137* 100*  BUN 35* 26*  CREATININE 2.59* 2.27*  CALCIUM 9.3 9.3    Lipid Panel:     Component Value Date/Time   CHOL 130 06/20/2020 0851   CHOL 135 06/17/2020 1053   TRIG 89 06/20/2020 0851   HDL 46 06/20/2020 0851   HDL 47 06/17/2020 1053   CHOLHDL 2.8 06/20/2020 0851   VLDL 18 06/20/2020 0851   LDLCALC 66 06/20/2020 0851   LDLCALC 63 06/17/2020 1053   HgbA1c:  Lab Results  Component Value Date   HGBA1C 6.2 (H) 06/19/2020   Urine Drug Screen:     Component Value Date/Time   LABOPIA NONE DETECTED 06/20/2020 1121   COCAINSCRNUR NONE DETECTED 06/20/2020 1121   LABBENZ NONE DETECTED 06/20/2020 1121   AMPHETMU NONE DETECTED 06/20/2020 1121   THCU NONE DETECTED 06/20/2020 1121   LABBARB NONE DETECTED 06/20/2020 1121    Alcohol Level No results found for: [place]  MR BRAIN WO CONTRAST 06/19/2020 IMPRESSION:  Subcentimeter likely subacute infarct at the left lateral aspect of the pons. Stable chronic findings detailed above including multiple small infarcts, chronic microvascular ischemic changes, and likely  hypertensive chronic microhemorrhages.   CT HEAD CODE STROKE WO CONTRAST 06/19/2020 IMPRESSION:  There is no acute intracranial hemorrhage or evidence of acute infarction. ASPECT score is 10.  DG CHEST PORT 1 VIEW 06/19/2020 IMPRESSION:  No active disease.  CT Cervical Spine Wo Contrast 06/19/2020 IMPRESSION:  1. Mild degenerative change in the cervical spine without acute fracture or subluxation.  2. ACDF at C5-C6 without hardware complication.  ECG - SR rate 80 BPM. (See cardiology reading for complete details)  PHYSICAL EXAM  Temp:  [97.8 F (36.6 C)-99 F (37.2 C)] 98.5 F (36.9 C) (01/10 1543) Pulse Rate:  [82-110] 88 (01/10 1543) Resp:  [16-22] 18 (01/10 1543) BP: (154-186)/(79-105) 154/85 (01/10 1543) SpO2:  [99 %-100 %] 100 % (01/10 1543)  General - Well nourished, well developed, in no apparent distress.  Ophthalmologic - fundi not visualized due to noncooperation.  Cardiovascular - Regular rhythm and rate.  Mental Status -  Level of arousal and orientation to time, place, and person were intact. Language including expression, naming, repetition, comprehension was assessed and found intact. Fund of Knowledge was assessed and was intact.  Cranial Nerves II - XII - II - Visual field intact OD,  only sees hand waving OS. III, IV, VI - Extraocular movements intact. V - Facial sensation intact bilaterally. VII - Facial movement intact bilaterally. VIII - Hearing & vestibular intact bilaterally. X - Palate elevates symmetrically. XI - Chin turning & shoulder shrug intact bilaterally. XII - Tongue protrusion intact.  Motor Strength - The patient's strength was normal in b/l upper and lower extremities, however difficulty in checking right lower extremity strength due to significant giveaway weakness, however, patient was able to do lift right LE against gravity when asking for knee flexion.  Bulk was normal and fasciculations were absent.   Motor Tone - Muscle tone was  assessed at the neck and appendages and was normal.  Reflexes - The patient's reflexes were symmetrical in all extremities and she had no pathological reflexes.  Sensory - Light touch, temperature/pinprick were assessed and were subjectively decreased on the right.    Coordination - The patient had normal movements in the hands with no ataxia or dysmetria.  Tremor was absent.  Gait and Station - deferred.   ASSESSMENT/PLAN Ms. Alvah Gilder is a 50 y.o. female with history of prior lacunar strokes, loop recorder and TEE March 2019, DM2, CKD stage 4, HTN, GERD, neuropathy who presents with acute onset R arm and leg weakness with aphasia and SBP 240's.Symptoms started after she fell at 1700 on 06/20/19.Noted on exam to be moving her RUE when no one is looking. She did not receive IV t-PA due to TPA functional symptoms and low concern for an organic cause.  Stroke: small acute/subacute infarct at the left lateral aspect of the pons - small vessel disease.  CT Head - There is no acute intracranial hemorrhage or evidence of acute infarction. ASPECT score is 10.   MRI head - Subcentimeter likely subacute infarct at the left lateral aspect of the pons. Stable chronic findings detailed above including multiple small infarcts  MRA head unremarkable  Carotid Doppler unremarkable  2D Echo - EF 70-75%, moderate concentric LV hypertrophy and severe basal septal hypertrophy  Lacey Jensen Virus 2 - negative  LDL - 63  HgbA1c - 6.2  UDS neg  VTE prophylaxis - none  aspirin 81 mg daily and clopidogrel 75 mg daily prior to admission, now on aspirin 81 mg daily and clopidogrel 75 mg daily. Recommend ASA and brilinta DAPT for one month and then back to ASA and plavix.   Patient will be counseled to be compliant with her antithrombotic medications  Ongoing aggressive stroke risk factor management  Therapy recommendations:  pending  Disposition:  Pending  Hx of stroke  07/2017 MRI showed  left, EF 65 to 70%, carotid Doppler and MRA negative.  LDL 43, A1c 12.5, patient put on DAPT and Lipitor 40.  08/2017 Loop recorder placed, no A. fib so far -current interrogation pending  02/27/2019 woke up with complete vision loss, gradually vision resumed OD, still has better vision OS.  MRI at that time showed 3 mm acute to early subacute infarct in the dorsal midline of the pons negative MRI head and neck.  05/01/2020 MRI showed new T2 hyperintense lesion of the right frontal white matter likely reflect an area of late subacute or chronic infarction.  Patient follows with Dr. Doy Hutching at [facility], next appointment 06/25/2020  Patient stated that she compliant with medication aspirin and Plavix.  Hx of seizure  03/2018 new onset seizure, discharged with Keppra  Still on Keppra, so far no recurrence  Continue Keppra at this time  Follows with  Dr. Doy Hutching at [facility]  Hypertension  Home BP meds: Coreg ; Apresoline ; Norvasc  Current BP meds: Labetalol prn  Stable . Permissive hypertension (OK if < 220/120) but gradually normalize in 2-3 days  . Long-term BP goal normotensive  Hyperlipidemia  Home Lipid lowering medication:  Lipitor 80 mg daily   LDL 63, goal < 70  Current lipid lowering medication: Lipitor 80 mg daily   Continue statin at discharge  Diabetes, controlled  Home diabetic meds: none  Current diabetic meds: SSI   HgbA1c 6.2, goal < 7.0  CKD stage IV  creatinine - 2.59  Anemia likely due to chronic kidney disease  Dysphagia  Patient refused swallow screen at bedside  Pending swallow evaluation  Consider p.o. meds after p.o. access  Other Stroke Risk Factors  Stress at home with [facility] day # 0  Neurology will sign off. Please call with questions. Pt will follow up with Dr. Doy Hutching at [facility] on 06/25/20. Thanks for the consult.   [name], MD PhD Stroke Neurology 06/21/2020 6:28 PM  To contact Stroke Continuity provider, please  refer to [email or web address] After hours, contact General Neurology

## 2020-06-22 DIAGNOSIS — N184 Chronic kidney disease, stage 4 (severe): Secondary | ICD-10-CM | POA: Diagnosis not present

## 2020-06-22 LAB — GLUCOSE, CAPILLARY
Glucose-Capillary: 116 mg/dL — ABNORMAL HIGH (ref 70–99)
Glucose-Capillary: 82 mg/dL (ref 70–99)
Glucose-Capillary: 99 mg/dL (ref 70–99)
Glucose-Capillary: 106 mg/dL — ABNORMAL HIGH (ref 70–99)

## 2020-06-22 MED ORDER — CLOPIDOGREL BISULFATE 75 MG PO TABS: 75.0000 mg | ORAL_TABLET | Freq: Every day | ORAL | 1 refills | Status: DC

## 2020-06-22 MED ORDER — CARVEDILOL 12.5 MG PO TABS: 12.5000 mg | ORAL_TABLET | Freq: Two times a day (BID) | ORAL | 0 refills | Status: DC

## 2020-06-22 MED ORDER — TICAGRELOR 90 MG PO TABS: 90.0000 mg | ORAL_TABLET | Freq: Two times a day (BID) | ORAL | 0 refills | Status: AC

## 2020-06-22 NOTE — Progress Notes (Signed)
Physical Therapy Treatment Patient Details Name: Christina Orozco MRN: [id number] DOB: 08-10-1970 Today's Date: 06/22/2020    History of Present Illness Pt is 50 yo female with PMH: DM, seizures 2019, HTN, GERD, CKD IV, asthma, neuropathy, anemia, CVA R thalamus 2017 who presents with R sided weakness, aphasia, and episode of altered consciousness where she fell. MRI shows subacute pontine infarcts. Pt reports she has had several other episodes recently of R sided weakness and times that she tried to speak and couldn't. Came to ED shortly after Christmas for this and states that she waited 6 hrs and wasn't seen so went home.    PT Comments    Pt with marked improvement today. Much more engaged in session and conversant throughout without periods of decreased verbalization. Also showed good insight into limitations and compensatory safety strategies. Pt was dizzy with initial sitting but subsided after 2 mins and did not worsen with gait. Pt supervision level with gait x 50' with RW. Updating rec from CIR to outpt to address LLE weakness and poor coordination LLE. Pt agreeable.  PT will continue to follow.   Follow Up Recommendations  Outpatient PT     Equipment Recommendations  None recommended by PT (has RW and cane)    Recommendations for Other Services       Precautions / Restrictions Precautions Precautions: Fall Precaution Comments: poor R sided coordination Restrictions Weight Bearing Restrictions: No    Mobility  Bed Mobility Overal bed mobility: Modified Independent Bed Mobility: Supine to Sit     Supine to sit: Modified independent (Device/Increase time)     General bed mobility comments: no assist needed. Increased time to slide LLE off bed  Transfers Overall transfer level: Modified independent Equipment used: Rolling walker (2 wheeled) Transfers: Sit to/from Stand Sit to Stand: Modified independent (Device/Increase time)         General transfer comment:  stood safely from bed to RW with appropriate placement of hands. Remembered to stop once up and make sure she was not dizzy. Utilized gaze stabilization furing transfer  Ambulation/Gait Ambulation/Gait assistance: Supervision Gait Distance (Feet): 50 Feet Assistive device: Rolling walker (2 wheeled) Gait Pattern/deviations: Step-through pattern Gait velocity: decreased Gait velocity interpretation: <1.8 ft/sec, indicate of risk for recurrent falls General Gait Details: pt steady with use of RW and dizziness did not return. Pt cautious to avoid quick changes in direction or head turns.   Stairs             Wheelchair Mobility    Modified Rankin (Stroke Patients Only) Modified Rankin (Stroke Patients Only) Pre-Morbid Rankin Score: No significant disability Modified Rankin: Moderate disability     Balance Overall balance assessment: Needs assistance Sitting-balance support: Feet supported;No upper extremity supported Sitting balance-Leahy Scale: Good Sitting balance - Comments: stable in sitting   Standing balance support: Bilateral upper extremity supported;During functional activity Standing balance-Leahy Scale: Fair Standing balance comment: able to maintain static standing without support, currently recommending RW for dynamic activity                            Cognition Arousal/Alertness: Awake/alert Behavior During Therapy: Flat affect Overall Cognitive Status: Within Functional Limits for tasks assessed                                 General Comments: pt much improved today, showed good awareness of limitations as well  as compensatory stretegies for safety. Mild dizziness with initial sitting but subsided once up >2 mins      Exercises General Exercises - Lower Extremity Ankle Circles/Pumps: AROM;Both;15 reps;Supine Gluteal Sets: AROM;10 reps;Supine Heel Slides: AROM;Both;10 reps;Supine    General Comments General comments (skin  integrity, edema, etc.): pt relays that her home situation has been very stressful as she has a neighbor above her that is very loud all hours of day and night among other things. Daughters working to find other housing for family.      Pertinent Vitals/Pain Pain Assessment: No/denies pain    Home Living                      Prior Function            PT Goals (current goals can now be found in the care plan section) Acute Rehab PT Goals Patient Stated Goal: Go home PT Goal Formulation: With patient Time For Goal Achievement: 07/05/20 Potential to Achieve Goals: Good Progress towards PT goals: Progressing toward goals    Frequency    Min 4X/week      PT Plan Discharge plan needs to be updated    Co-evaluation              AM-PAC PT "6 Clicks" Mobility   Outcome Measure  Help needed turning from your back to your side while in a flat bed without using bedrails?: None Help needed moving from lying on your back to sitting on the side of a flat bed without using bedrails?: None Help needed moving to and from a bed to a chair (including a wheelchair)?: None Help needed standing up from a chair using your arms (e.g., wheelchair or bedside chair)?: None Help needed to walk in hospital room?: A Little Help needed climbing 3-5 steps with a railing? : A Little 6 Click Score: 22    End of Session Equipment Utilized During Treatment: Gait belt Activity Tolerance: Patient tolerated treatment well Patient left: in chair;with call bell/phone within reach;with chair alarm set Nurse Communication: Mobility status PT Visit Diagnosis: Hemiplegia and hemiparesis;Difficulty in walking, not elsewhere classified (R26.2) Hemiplegia - Right/Left: Right Hemiplegia - dominant/non-dominant: Dominant Hemiplegia - caused by: Cerebral infarction     Time: 0913-0950 PT Time Calculation (min) (ACUTE ONLY): 37 min  Charges:  $Gait Training: 8-22 mins $Therapeutic Activity: 8-22  mins                     Leighton Roach, [place]  Pager [phone] [facility] [place] 06/22/2020, 1:19 PM

## 2020-06-22 NOTE — Progress Notes (Signed)
Inpatient Rehabilitation Admissions Coordinator  Noted therapy has changed recommendations to Outpt. Patient wishes for d/c home. We will sign off.  Danne Willow, RN, MSN Rehab Admissions Coordinator [phone] 06/22/2020 1:29 PM

## 2020-06-22 NOTE — TOC Transition Note (Signed)
Transition of Care [facility]) - CM/SW Discharge Note   Patient Details  Name: Kamila Broda MRN: [id number] Date of Birth: 05-25-1971  [facility] CM/SW Contact:  Pollie Friar, RN Phone Number: 06/22/2020, 3:09 PM   Clinical Narrative:    Pt is discharging home with outpatient therapy arranged through the [facility]. Information on the AVS. CM provided her 30 day free card for [facility]. DME at home: cane, shower seat, walker, 3 in 1.  Pt has supervision at home and transportation to home.   Final next level of care: OP Rehab Barriers to Discharge: No Barriers Identified   Patient Goals and CMS Choice     Choice offered to / list presented to : Patient  Discharge Placement                       Discharge Plan and Services                                     Social Determinants of Health (SDOH) Interventions     Readmission Risk Interventions No flowsheet data found.

## 2020-06-22 NOTE — Progress Notes (Addendum)
Discharge instructions given. Patient verbalized understanding and all questions were answered.  ?

## 2020-06-22 NOTE — Discharge Summary (Signed)
Christina Orozco DZH:[id number] DOB: 1971/02/03 DOA: 06/19/2020  PCP: Rikki Spearing, NP  Admit date: 06/19/2020  Discharge date: 06/22/2020  Admitted From: Home   disposition: Home   Recommendations for Outpatient Follow-up:   Follow-up with your neurologist at your previously scheduled appointment on 06/25/2020  Home Health: Home PT and home OT Equipment/Devices: None Consultations: Neurology Discharge Condition: Stable CODE STATUS: Full Diet Recommendation: Heart Healthy   Diet Order            Diet - low sodium heart healthy           Diet regular Room service appropriate? Yes; Fluid consistency: Thin  Diet effective now                  Chief Complaint  Patient presents with  . Code Stroke     Brief history of present illness from the day of admission and additional interim summary     Christina Orozco is an 50 y.o. female  with history of prior lacunar strokes,loop recorder and TEE March 2019,DM2,CKD stage 4,HTN, GERD, neuropathywho presents with acute onset R arm and leg weakness with aphasiaand SBP 240's.Symptoms started aftershe fell at 1700 on 06/20/19.  Work-up revealed subacute infarct in the left lateral aspect of the pons.  Stable chronic findings include multiple small infarcts and chronic microvascular ischemic changes and hypertensive chronic microhemorrhages.                                                                  Hospital Course   Was seen by the neurology team whose work-up is copied and pasted below.  She was discharged home on DAPT with Brilinta and aspirin for 1 month.  After 1 month patient can go back to her usual aspirin and Plavix.  Importance of being compliant with antihypertensives was also stressed with the patient  During her speech evaluation, there was  concern for mass at the base of the patient's tongue.  Patient underwent CT soft tissue scan of the neck.  She was noted to have hypertrophy of her lingual tonsils and her adenoids.  Patient states she had her tonsils and adenoids removed as a child.  This was discussed with Dr. Carlis Abbott at [facility]ology who noted that the lingual tonsils are not usually removed and her adenoids have probably grown back  Reviewed importance of not taking Brilinta and Plavix together with the patient.  She is planning on seeing her neurologist in 3 days as previously scheduled.  Carvedilol was decreased from 25-12.5 twice daily.  Otherwise patient's antihypertensive medications including amlodipine and hydralazine were continued   Stroke: small acute/subacute infarct at the left lateral aspect of the pons - small vessel disease.  CT Head - There is no acute intracranial hemorrhage or evidence of acute  infarction. ASPECT score is 10.   MRI head - Subcentimeter likely subacute infarct at the left lateral aspect of the pons. Stable chronic findings detailed above including multiple small infarcts  MRA head unremarkable  Carotid Doppler unremarkable  2D Echo - EF 70-75%, moderate concentric LV hypertrophy and severe basal septal hypertrophy  Lacey Jensen Virus 2 - negative  LDL - 63  HgbA1c - 6.2  UDS neg   Hx of stroke  07/2017 MRI showed left, EF 65 to 70%, carotid Doppler and MRA negative.  LDL 43, A1c 12.5, patient put on DAPT and Lipitor 40.  08/2017 Loop recorder placed, no A. fib so far -current interrogation pending  02/27/2019 woke up with complete vision loss, gradually vision resumed OD, still has better vision OS.  MRI at that time showed 3 mm acute to early subacute infarct in the dorsal midline of the pons negative MRI head and neck.  05/01/2020 MRI showed new T2 hyperintense lesion of the right frontal white matter likely reflect an area of late subacute or chronic infarction.  Patient  follows with Dr. Doy Hutching at [facility], next appointment 06/25/2020  Patient stated that she compliant with medication aspirin and Plavix.  Hx of seizure  03/2018 new onset seizure, discharged with Keppra  Still on Keppra, so far no recurrence  Continue Keppra at this time  Follows with Dr. Doy Hutching at [facility]  Hypertension Continue home BP meds: Coreg ; Apresoline ; Norvasc   Discharge diagnosis     Active Problems:   Asthma, mild intermittent   Hyperlipidemia   Anemia of chronic disease   Essential hypertension   Non-insulin dependent type 2 diabetes mellitus (HCC)   CKD (chronic kidney disease) stage 4, GFR 15-29 ml/min (HCC)   CVA (cerebrovascular accident) ([place])   CVA (cerebral vascular accident) [facility])    Discharge instructions    Discharge Instructions    Ambulatory referral to Occupational Therapy   Complete by: As directed    Ambulatory referral to Physical Therapy   Complete by: As directed    Call MD for:  difficulty breathing, headache or visual disturbances   Complete by: As directed    Call MD for:  persistant dizziness or light-headedness   Complete by: As directed    Diet - low sodium heart healthy   Complete by: As directed    Discharge instructions   Complete by: As directed    1. We have started you on Brillinta for 1 month only. While you are on Brillinta, DO NOT TAKE PLAVIX. 2. Start taking Plavix after you finish the 1 month of Brillinta. 3. Continue taking Aspirin as before (both with Brillinta and Plavix) 4. Make sure you see your neurologist within 1 week and go over all your medications with him after discharge.   Increase activity slowly   Complete by: As directed       Discharge Medications   Allergies as of 06/22/2020      Reactions   Lisinopril Swelling, Other (See Comments)   Facial swelling    Penicillins Hives   Has patient had a PCN reaction causing immediate rash, facial/tongue/throat swelling, SOB or lightheadedness with  hypotension: yes Has patient had a PCN reaction causing severe rash involving mucus membranes or skin necrosis: No Has patient had a PCN reaction that required hospitalization No Has patient had a PCN reaction occurring within the last 10 years: No If all of the above answers are "NO", then may proceed wit   Gabapentin Swelling, Other (  See Comments)   "I thought I was having another stroke."       Medication List    STOP taking these medications   doxazosin 4 MG tablet Commonly known as: CARDURA   tiZANidine 2 MG tablet Commonly known as: ZANAFLEX     TAKE these medications   albuterol 108 (90 Base) MCG/ACT inhaler Commonly known as: ProAir HFA INHALE 1 OR 2 PUFFS INTO THE LUNGS EVERY 6 HOURS AS NEEDED FOR WHEEZING OR SHORTNESS OF BREATH What changed:   how much to take  how to take this  when to take this  reasons to take this  additional instructions   amLODipine 10 MG tablet Commonly known as: NORVASC TAKE 1 TABLET BY MOUTH EVERY DAY   aspirin 81 MG EC tablet Take 81 mg by mouth daily.   atorvastatin 80 MG tablet Commonly known as: LIPITOR Take 80 mg by mouth at bedtime.   carvedilol 12.5 MG tablet Commonly known as: COREG Take 1 tablet (12.5 mg total) by mouth 2 (two) times daily with a meal. What changed:   medication strength  how much to take  additional instructions   clopidogrel 75 MG tablet Commonly known as: PLAVIX Take 1 tablet (75 mg total) by mouth daily. Start taking this medicine AFTER YOU COMPLETE THE 1 MONTH OF BRILLINTA. Do not take Plavix and Brillinta at the same time. What changed: additional instructions   DULoxetine 60 MG capsule Commonly known as: CYMBALTA Take 60 mg by mouth at bedtime.   Eszopiclone 3 MG Tabs Take 3 mg by mouth at bedtime. Take immediately before bedtime   ferrous sulfate 325 (65 FE) MG tablet Take 1 tablet (325 mg total) by mouth daily.   furosemide 20 MG tablet Commonly known as: LASIX Take 40 mg  by mouth daily as needed for fluid.   glucose blood test strip Commonly known as: Contour Next Test 1 each by Other route 2 (two) times daily. And lancets 2/day   hydrALAZINE 25 MG tablet Commonly known as: APRESOLINE Take 25 mg by mouth every 8 (eight) hours as needed. FOR BLOOD PRESSURE ABOVE 150   levETIRAcetam 500 MG tablet Commonly known as: KEPPRA TAKE 1 TABLET(500 MG) BY MOUTH TWICE DAILY What changed:   how much to take  how to take this  when to take this   meclizine 25 MG tablet Commonly known as: ANTIVERT TAKE 1 TABLET(25 MG) BY MOUTH THREE TIMES DAILY AS NEEDED FOR DIZZINESS What changed: See the new instructions.   Ozempic (0.25 or 0.5 MG/DOSE) 2 MG/1.5ML Sopn Generic drug: Semaglutide(0.25 or 0.5MG /DOS) Inject 0.5 mg into the skin every Sunday.   pantoprazole 40 MG tablet Commonly known as: PROTONIX TAKE 1 TABLET(40 MG) BY MOUTH DAILY What changed: See the new instructions.   pregabalin 50 MG capsule Commonly known as: LYRICA Take 1 capsule by mouth at bedtime.   ticagrelor 90 MG Tabs tablet Commonly known as: BRILINTA Take 1 tablet (90 mg total) by mouth 2 (two) times daily.   topiramate 25 MG tablet Commonly known as: TOPAMAX Take 50 mg by mouth at bedtime.   VITAMIN D3 PO Take 2,000 mg by mouth daily.        Follow-up Information    Kerin Perna., MD. Go on 06/25/2020.   Specialty: Neurology Contact information: [address] [place]  [address]        [place] Follow up.   Specialty: Rehabilitation Why: The outpatient therapy will  contact you for the first appointment Contact information: [address] [place]              Major procedures and Radiology Reports - PLEASE review detailed and final reports thoroughly  -       CT SOFT TISSUE NECK WO CONTRAST  Result Date: 06/21/2020 CLINICAL  DATA:  Tongue mass EXAM: CT NECK WITHOUT CONTRAST TECHNIQUE: Multidetector CT imaging of the neck was performed following the standard protocol without intravenous contrast. COMPARISON:  CT angio head and neck 03/25/2018 FINDINGS: Pharynx and larynx: Tongue evaluation limited without intravenous contrast. No tongue mass identified. Recommend correlation with mucosal examination. The adenoid and palatine tonsils are mildly prominent. The lingual tonsils are also mildly prominent and symmetric. Normal larynx and epiglottis. Salivary glands: No inflammation, mass, or stone. Thyroid: Normal size.  No thyroid nodule. Lymph nodes: No enlarged cervical lymph nodes. Vascular: Limited evaluation without intravenous contrast. Limited intracranial: Negative Visualized orbits: Negative Mastoids and visualized paranasal sinuses: Paranasal sinuses clear. Visualized mastoid clear bilaterally. Skeleton: No acute abnormality.  ACDF C5-6 with solid fusion. Upper chest: Lung apices clear bilaterally. Other: None IMPRESSION: No tongue mass identified. Limited evaluation without intravenous contrast. Correlate with mucosal evaluation Prominent tonsils and adenoid likely hypertrophy. Correlate with mucosal evaluation. No adenopathy in the neck. Electronically Signed   By: Franchot Gallo M.D.   On: 06/21/2020 14:02   CT Cervical Spine Wo Contrast  Result Date: 06/19/2020 CLINICAL DATA:  Right arm and leg weakness.  Aphasia.  Fall. EXAM: CT CERVICAL SPINE WITHOUT CONTRAST TECHNIQUE: Multidetector CT imaging of the cervical spine was performed without intravenous contrast. Multiplanar CT image reconstructions were also generated. COMPARISON:  Most recent CT 09/17/2019 FINDINGS: Alignment: Stable straightening of normal lordosis. No traumatic subluxation. Skull base and vertebrae: No acute fracture. Vertebral body heights are maintained. The dens and skull base are intact. Anterior fusion hardware at C5-C6. No hardware complication.  Incidental vertebral body hemangioma within T1 vertebral body extending into the left pedicle. Soft tissues and spinal canal: No prevertebral fluid or swelling. No visible canal hematoma. Disc levels: ACDF at C5-C6. Disc space narrowing with endplate spurring at E0-C1. Minimal C6-C7 disc space narrowing and endplate spurring. Upper chest: No acute findings. Other: None. IMPRESSION: 1. Mild degenerative change in the cervical spine without acute fracture or subluxation. 2. ACDF at C5-C6 without hardware complication. Electronically Signed   By: Keith Rake M.D.   On: 06/19/2020 21:28   MR ANGIO HEAD WO CONTRAST  Result Date: 06/20/2020 CLINICAL DATA:  Stroke workup EXAM: MRA HEAD WITHOUT CONTRAST TECHNIQUE: Angiographic images of the Circle of Willis were obtained using MRA technique without intravenous contrast. COMPARISON:  CTA head neck 03/26/2018 FINDINGS: Dominant left vertebral artery. Patent bilateral PICA with non covered right-sided origin. The basilar is smooth and widely patent. Unremarkable superior cerebellar (early branching on the left) and posterior cerebral arteries. Smooth and widely patent internal carotid arteries and their branches. Negative for aneurysm or vascular malformation. Chronic microhemorrhages seen in the pons. IMPRESSION: Normal intracranial MRA. Electronically Signed   By: Monte Fantasia M.D.   On: 06/20/2020 09:46   MR BRAIN WO CONTRAST  Result Date: 06/19/2020 CLINICAL DATA:  Code stroke follow-up EXAM: MRI HEAD WITHOUT CONTRAST TECHNIQUE: Multiplanar, multiecho pulse sequences of the brain and surrounding structures were obtained without intravenous contrast. COMPARISON:  05/01/2020 FINDINGS: DWI, SWAN, axial T2 FLAIR, and sagittal T1 sequences were obtained. Additional sequences were not performed at  the request of ordering physician. Brain: Small subcentimeter focus of diffusion hyperintensity at the left lateral aspect of the pons with corresponding ADC  isointensity. Patchy and confluent areas of T2 hyperintensity in the supratentorial and pontine white matter are nonspecific but probably reflect stable chronic microvascular ischemic changes. There are chronic small vessel infarcts of the thalamus bilaterally, white matter adjacent to the right frontal horn, and right cerebellum. Multiple foci of susceptibility hypointensity are present with involvement of the pons, medulla, thalamus, basal ganglia, and cerebral white matter consistent with chronic microhemorrhages likely secondary to hypertension. Ventricles are stable in size. There is no intracranial mass or mass effect. No extra-axial fluid collection. Vascular: Major vessel flow voids at the skull base are preserved. Skull and upper cervical spine: Normal marrow signal is preserved. Sinuses/Orbits: Paranasal sinuses are aerated. Orbits are unremarkable. Other: Sella is unremarkable. Mastoid air cells are clear. Right frontal scalp lipoma. IMPRESSION: Subcentimeter likely subacute infarct at the left lateral aspect of the pons. Stable chronic findings detailed above including multiple small infarcts, chronic microvascular ischemic changes, and likely hypertensive chronic microhemorrhages. These results were discussed by telephone at the time of interpretation on 06/19/2020 at 6:30 pm with provider [facility] , who verbally acknowledged these results. Electronically Signed   By: Macy Mis M.D.   On: 06/19/2020 18:47   DG CHEST PORT 1 VIEW  Result Date: 06/19/2020 CLINICAL DATA:  Pain status post fall EXAM: PORTABLE CHEST 1 VIEW COMPARISON:  June 07, 2020 FINDINGS: The heart size and mediastinal contours are within normal limits. Both lungs are clear. The visualized skeletal structures are unremarkable. IMPRESSION: No active disease. Electronically Signed   By: Constance Holster M.D.   On: 06/19/2020 21:45   ECHOCARDIOGRAM COMPLETE  Result Date: 06/21/2020    ECHOCARDIOGRAM REPORT   Patient  Name:   Christina Orozco Date of Exam: 06/21/2020 Medical Rec #:  [id number]        Height:       62.0 in Accession #:    [id number]       Weight:       154.1 lb Date of Birth:  06-22-1970       BSA:          39.711 m Patient Age:    50 years         BP:           166/86 mmHg Patient Gender: F                HR:           90 bpm. Exam Location:  Inpatient Procedure: 2D Echo, Color Doppler and Cardiac Doppler Indications:    Stroke i163.9  History:        Patient has no prior history of Echocardiogram examinations.                 Risk Factors:Hypertension, Diabetes and Dyslipidemia.  Sonographer:    Raquel Sarna Senior RDCS Referring Phys: [place]  1. Left ventricular ejection fraction, by estimation, is 70 to 75%. The left ventricle has hyperdynamic function. The left ventricle has no regional wall motion abnormalities. There is moderate concentric left ventricular hypertrophy and severe basal septal hypertrophy. Left ventricular diastolic parameters were normal.  2. Right ventricular systolic function is normal. The right ventricular size is normal. Tricuspid regurgitation signal is inadequate for assessing PA pressure.  3. A small pericardial effusion is present. The pericardial effusion is posterior to the left ventricle.  4. The mitral valve is normal in structure. No evidence of mitral valve regurgitation. No evidence of mitral stenosis.  5. The aortic valve is normal in structure. Aortic valve regurgitation is trivial. No aortic stenosis is present.  6. The inferior vena cava is normal in size with greater than 50% respiratory variability, suggesting right atrial pressure of 3 mmHg. FINDINGS  Left Ventricle: Left ventricular ejection fraction, by estimation, is 70 to 75%. The left ventricle has hyperdynamic function. The left ventricle has no regional wall motion abnormalities. The left ventricular internal cavity size was normal in size. There is moderate concentric left ventricular  hypertrophy. Left ventricular diastolic parameters were normal. Normal left ventricular filling pressure. Right Ventricle: The right ventricular size is normal. No increase in right ventricular wall thickness. Right ventricular systolic function is normal. Tricuspid regurgitation signal is inadequate for assessing PA pressure. Left Atrium: Left atrial size was normal in size. Right Atrium: Right atrial size was normal in size. Pericardium: A small pericardial effusion is present. The pericardial effusion is posterior to the left ventricle. Mitral Valve: The mitral valve is normal in structure. No evidence of mitral valve regurgitation. No evidence of mitral valve stenosis. Tricuspid Valve: The tricuspid valve is normal in structure. Tricuspid valve regurgitation is not demonstrated. No evidence of tricuspid stenosis. Aortic Valve: The aortic valve is normal in structure. Aortic valve regurgitation is trivial. No aortic stenosis is present. Pulmonic Valve: The pulmonic valve was normal in structure. Pulmonic valve regurgitation is not visualized. No evidence of pulmonic stenosis. Aorta: The aortic root is normal in size and structure. Venous: The inferior vena cava is normal in size with greater than 50% respiratory variability, suggesting right atrial pressure of 3 mmHg. IAS/Shunts: No atrial level shunt detected by color flow Doppler.  LEFT VENTRICLE PLAX 2D LVIDd:         2.90 cm  Diastology LVIDs:         1.70 cm  LV e' medial:    11.20 cm/s LV PW:         1.70 cm  LV E/e' medial:  5.3 LV IVS:        1.40 cm  LV e' lateral:   10.20 cm/s LVOT diam:     2.00 cm  LV E/e' lateral: 5.8 LV SV:         55 LV SV Index:   32 LVOT Area:     3.14 cm  RIGHT VENTRICLE RV S prime:     12.50 cm/s TAPSE (M-mode): 2.9 cm LEFT ATRIUM           Index       RIGHT ATRIUM           Index LA diam:      2.90 cm 1.69 cm/m  RA Area:     11.30 cm LA Vol (A2C): 26.8 ml 15.66 ml/m RA Volume:   22.40 ml  13.09 ml/m LA Vol (A4C): 40.3 ml  23.55 ml/m  AORTIC VALVE LVOT Vmax:   92.80 cm/s LVOT Vmean:  63.700 cm/s LVOT VTI:    0.176 m  AORTA Ao Root diam: 2.80 cm Ao Asc diam:  3.10 cm MITRAL VALVE MV Area (PHT): 2.54 cm    SHUNTS MV Decel Time: 299 msec    Systemic VTI:  0.18 m MV E velocity: 59.50 cm/s  Systemic Diam: 2.00 cm MV A velocity: 65.60 cm/s MV E/A ratio:  0.91 Fransico Him MD Electronically signed by Fransico Him MD Signature Date/Time: 06/21/2020/11:03:59  AM    Final    CT HEAD CODE STROKE WO CONTRAST  Result Date: 06/19/2020 CLINICAL DATA:  Code stroke. EXAM: CT HEAD WITHOUT CONTRAST TECHNIQUE: Contiguous axial images were obtained from the base of the skull through the vertex without intravenous contrast. COMPARISON:  09/17/2019, MRI 05/01/2020 FINDINGS: Brain: No acute intracranial hemorrhage, mass effect, or edema. No new loss of gray-white differentiation. Chronic infarcts of the right frontal white matter adjacent to the frontal horn, bilateral thalamus, and left caudothalamic groove. Ventricles and sulci are normal in size and configuration. There is no extra-axial collection. Vascular: No hyperdense vessel. Minimal intracranial atherosclerotic calcification at the skull base. Skull: Unremarkable. Sinuses/Orbits: No acute finding. Other: Small right frontal scalp lipoma. Mastoid air cells are clear. ASPECTS ([place] Stroke Program Early CT Score) - Ganglionic level infarction (caudate, lentiform nuclei, internal capsule, insula, M1-M3 cortex): 7 - Supraganglionic infarction (M4-M6 cortex): 3 Total score (0-10 with 10 being normal): 10 IMPRESSION: There is no acute intracranial hemorrhage or evidence of acute infarction. ASPECT score is 10. These results were communicated to Dr. Lorrin Goodell at 6:09 pm on 06/19/2020 by text page via the [facility] messaging system. Electronically Signed   By: Macy Mis M.D.   On: 06/19/2020 18:13   VAS US CAROTID (at [facility] and [facility] only)  Result Date: 06/20/2020 Carotid Arterial Duplex Study  Indications:       CVA. Risk Factors:      Hypertension, hyperlipidemia. Comparison Study:  07/26/2017 - Bi 1-39% ICA stenosis. Performing Technologist: Oliver Hum RVT  Examination Guidelines: A complete evaluation includes B-mode imaging, spectral Doppler, color Doppler, and power Doppler as needed of all accessible portions of each vessel. Bilateral testing is considered an integral part of a complete examination. Limited examinations for reoccurring indications may be performed as noted.  Right Carotid Findings: +----------+--------+--------+--------+-----------------------+--------+           PSV cm/sEDV cm/sStenosisPlaque Description     Comments +----------+--------+--------+--------+-----------------------+--------+ CCA Prox  88      14              smooth and heterogenous         +----------+--------+--------+--------+-----------------------+--------+ CCA Distal61      14              smooth and heterogenous         +----------+--------+--------+--------+-----------------------+--------+ ICA Prox  61      12              smooth and heterogenous         +----------+--------+--------+--------+-----------------------+--------+ ICA Distal110     38                                     tortuous +----------+--------+--------+--------+-----------------------+--------+ ECA       63      9                                               +----------+--------+--------+--------+-----------------------+--------+ +----------+--------+-------+--------+-------------------+           PSV cm/sEDV cmsDescribeArm Pressure (mmHG) +----------+--------+-------+--------+-------------------+ Subclavian140                                        +----------+--------+-------+--------+-------------------+ +---------+--------+--+--------+--+---------+  VertebralPSV cm/s51EDV cm/s11Antegrade +---------+--------+--+--------+--+---------+  Left Carotid Findings:  +----------+--------+--------+--------+-----------------------+--------+           PSV cm/sEDV cm/sStenosisPlaque Description     Comments +----------+--------+--------+--------+-----------------------+--------+ CCA Prox  105     17              smooth and heterogenous         +----------+--------+--------+--------+-----------------------+--------+ CCA Distal63      11              smooth and heterogenous         +----------+--------+--------+--------+-----------------------+--------+ ICA Prox  52      14                                              +----------+--------+--------+--------+-----------------------+--------+ ICA Distal101     39                                     tortuous +----------+--------+--------+--------+-----------------------+--------+ ECA       77      6                                               +----------+--------+--------+--------+-----------------------+--------+ +----------+--------+--------+--------+-------------------+           PSV cm/sEDV cm/sDescribeArm Pressure (mmHG) +----------+--------+--------+--------+-------------------+ Subclavian185                                         +----------+--------+--------+--------+-------------------+ +---------+--------+--+--------+--+---------+ VertebralPSV cm/s43EDV cm/s12Antegrade +---------+--------+--+--------+--+---------+   Summary: Right Carotid: Velocities in the right ICA are consistent with a 1-39% stenosis. Left Carotid: Velocities in the left ICA are consistent with a 1-39% stenosis. Vertebrals: Bilateral vertebral arteries demonstrate antegrade flow. *See table(s) above for measurements and observations.  Electronically signed by Monica Martinez MD on 06/20/2020 at 1:19:27 PM.    Final     Micro Results    Recent Results (from the past 240 hour(s))  Resp Panel by RT-PCR (Flu A&B, Covid) Nasopharyngeal Swab     Status: None   Collection Time: 06/19/20  6:08 PM    Specimen: Nasopharyngeal Swab; Nasopharyngeal(NP) swabs in vial transport medium  Result Value Ref Range Status   SARS Coronavirus 2 by RT PCR NEGATIVE NEGATIVE Final    Comment: (NOTE) SARS-CoV-2 target nucleic acids are NOT DETECTED.  The SARS-CoV-2 RNA is generally detectable in upper respiratory specimens during the acute phase of infection. The lowest concentration of SARS-CoV-2 viral copies this assay can detect is 138 copies/mL. A negative result does not preclude SARS-Cov-2 infection and should not be used as the sole basis for treatment or other patient management decisions. A negative result may occur with  improper specimen collection/handling, submission of specimen other than nasopharyngeal swab, presence of viral mutation(s) within the areas targeted by this assay, and inadequate number of viral copies(<138 copies/mL). A negative result must be combined with clinical observations, patient history, and epidemiological information. The expected result is Negative.  Fact Sheet for Patients:  EntrepreneurPulse.com.au  Fact Sheet for Healthcare Providers:  IncredibleEmployment.be  This test is no t yet approved or cleared by the [place]  States FDA and  has been authorized for detection and/or diagnosis of SARS-CoV-2 by FDA under an Emergency Use Authorization (EUA). This EUA will remain  in effect (meaning this test can be used) for the duration of the COVID-19 declaration under Section 564(b)(1) of the Act, 21 U.S.C.section 360bbb-3(b)(1), unless the authorization is terminated  or revoked sooner.       Influenza A by PCR NEGATIVE NEGATIVE Final   Influenza B by PCR NEGATIVE NEGATIVE Final    Comment: (NOTE) The Xpert Xpress SARS-CoV-2/FLU/RSV plus assay is intended as an aid in the diagnosis of influenza from Nasopharyngeal swab specimens and should not be used as a sole basis for treatment. Nasal washings and aspirates are  unacceptable for Xpert Xpress SARS-CoV-2/FLU/RSV testing.  Fact Sheet for Patients: EntrepreneurPulse.com.au  Fact Sheet for Healthcare Providers: IncredibleEmployment.be  This test is not yet approved or cleared by the [place] FDA and has been authorized for detection and/or diagnosis of SARS-CoV-2 by FDA under an Emergency Use Authorization (EUA). This EUA will remain in effect (meaning this test can be used) for the duration of the COVID-19 declaration under Section 564(b)(1) of the Act, 21 U.S.C. section 360bbb-3(b)(1), unless the authorization is terminated or revoked.  Performed at [facility], [place] [address], [place], [place] [address]     Today   Taylor feels much improved since admission.  Feels ready to go home.  Denies chest pain, shortness of breath or abdominal pain.  Feels they can take care of themselves with the resources they have at home.  Objective   Blood pressure (!) 149/86, pulse 79, temperature 98.3 F (36.8 C), temperature source Oral, resp. rate 16, height 5\' 2"  (1.575 m), weight 69.9 kg, last menstrual period 05/22/2018, SpO2 100 %.   Intake/Output Summary (Last 24 hours) at 06/22/2020 1504 Last data filed at 06/22/2020 0800 Gross per 24 hour  Intake 240 ml  Output --  Net 240 ml    Exam General: Patient appears well and in good spirits sitting up in bed in no acute distress.  Eyes: sclera anicteric, conjuctiva mild injection bilaterally CVS: S1-S2, regular  Respiratory:  decreased air entry bilaterally secondary to decreased inspiratory effort, rales at bases  GI: NABS, soft, NT  LE: No edema.     Data Review   CBC w Diff:  Lab Results  Component Value Date   WBC 5.7 06/21/2020   HGB 9.2 (L) 06/21/2020   HGB 9.8 (L) 08/20/2017   HCT 27.8 (L) 06/21/2020   HCT 29.2 (L) 08/20/2017   PLT 249 06/21/2020   PLT 308 08/20/2017   LYMPHOPCT 44 06/19/2020   MONOPCT 5  06/19/2020   EOSPCT 2 06/19/2020   BASOPCT 1 06/19/2020    CMP:  Lab Results  Component Value Date   NA 145 06/21/2020   NA 145 (H) 06/17/2020   K 3.4 (L) 06/21/2020   CL 114 (H) 06/21/2020   CO2 20 (L) 06/21/2020   BUN 26 (H) 06/21/2020   BUN 28 (H) 06/17/2020   CREATININE 2.27 (H) 06/21/2020   CREATININE 1.73 (H) 04/23/2017   PROT 7.1 06/19/2020   PROT 6.7 06/17/2020   ALBUMIN 3.8 06/19/2020   ALBUMIN 4.2 06/17/2020   BILITOT 0.5 06/19/2020   BILITOT <0.2 06/17/2020   ALKPHOS 58 06/19/2020   AST 18 06/19/2020   ALT 16 06/19/2020  .   Total Time in preparing paper work, data evaluation and todays exam - 35 minutes  Vashti Hey M.D on 06/22/2020 at 3:04 PM  [facility]   Office  [phone]

## 2020-06-28 ENCOUNTER — Other Ambulatory Visit: Payer: Self-pay | Admitting: *Deleted

## 2020-06-28 NOTE — Patient Outreach (Signed)
[facility]) Care Management  06/28/2020  Christina Orozco 1971/04/21 [id number]   EMMI -Stroke EMMI red flag alert  Day: #1, #3 Date: 1/14/ and 06/27/20 Reason: Questions/problems with meds? Yes No  Filled new prescriptions?   No   [facility] Admission 1/8-1/11/22 Dx: CVA,   Subjective: Successful outreach call to patient, explained reason for the call, EMMI automated calls after discharge . Patient recalls receiving calls. Addressed EMMI calls, 1/14, Questions/ problem with medications? Yes, patient questions whether she should still be taking aspirin with Brilinta,reviewed discharge instruction medication list, with  Brilinta x 30 days,with  continuing aspirin  then return to Aspirin and Plavix after that, patient able to restate discharge instructions including not to take Brilinta and Plavix at the same time. Patient states occasionally when blowing her nose noting blood streaks in secretions, she denies having nose bleed, reinforced notifying MD of bleeding or continued concern of noting blood when blowing nose .  Filled new prescription? No patient reports receiving a prescription for Topamax at post discharge visit with neurology on 1/14, she reports not getting medication filled yet. She reports cost of prescription $3 with medicaid, she plans to ask her brother to assist her.   Social  Patient reports living at home with her 4 year old daughter. Patient does not drive she relies on her brother for transportation usually.  She reports having cane /walker for use at home. Patient voiced understanding of DC instructions for outpatient physical therapy, she reports attempting to call but no answer. Placed call with patient on line  to outpatient neuro Rehab, received message office closed and will reopen on 1/18. Patient states that she will plan return call on tomorrow.  Patient states that she is working on disability, her lawyer is appealing. Patient  states that she is planning to move from current apartment states she has to be out by March apartments are being torn down. She reports already approved for section 8 housing and is searching [email or web address] for another apartment preferably a house.     Plan Patient is agreeable to follow up call on this week.  Will plan return call in the next 4 business days to follow up on setting up outpatient rehab and if she is accessing transportation services with medicaid.    Joylene Draft, RN, BSN  [place] Management Coordinator  [phone]- Mobile [phone]- Toll Free Main Office

## 2020-07-01 ENCOUNTER — Other Ambulatory Visit: Payer: Self-pay | Admitting: *Deleted
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
## 2020-07-05 ENCOUNTER — Other Ambulatory Visit: Payer: Self-pay | Admitting: *Deleted

## 2020-07-05 DIAGNOSIS — F32A Depression, unspecified: Secondary | ICD-10-CM | POA: Insufficient documentation
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
## 2020-07-12 ENCOUNTER — Ambulatory Visit: Payer: Medicaid Other | Attending: Internal Medicine | Admitting: Physical Therapy

## 2020-07-12 ENCOUNTER — Encounter: Payer: Self-pay | Admitting: Physical Therapy

## 2020-07-12 ENCOUNTER — Ambulatory Visit: Payer: Medicaid Other | Admitting: Occupational Therapy

## 2020-07-12 ENCOUNTER — Other Ambulatory Visit: Payer: Self-pay

## 2020-07-12 VITALS — BP 140/85 | HR 82

## 2020-07-12 DIAGNOSIS — M25611 Stiffness of right shoulder, not elsewhere classified: Secondary | ICD-10-CM | POA: Diagnosis present

## 2020-07-12 DIAGNOSIS — M6281 Muscle weakness (generalized): Secondary | ICD-10-CM | POA: Insufficient documentation

## 2020-07-12 DIAGNOSIS — R2681 Unsteadiness on feet: Secondary | ICD-10-CM | POA: Insufficient documentation

## 2020-07-12 DIAGNOSIS — R41840 Attention and concentration deficit: Secondary | ICD-10-CM

## 2020-07-12 DIAGNOSIS — R29818 Other symptoms and signs involving the nervous system: Secondary | ICD-10-CM

## 2020-07-12 DIAGNOSIS — R278 Other lack of coordination: Secondary | ICD-10-CM

## 2020-07-12 DIAGNOSIS — R2689 Other abnormalities of gait and mobility: Secondary | ICD-10-CM | POA: Insufficient documentation
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
## 2020-07-12 NOTE — Therapy (Signed)
[facility] [address] [place], [place], [address] Phone: [phone]   Fax:  [phone]  Physical Therapy Evaluation  Patient Details  Name: Christina Orozco MRN: [id number] Date of Birth: Apr 12, 1971 Referring Provider (PT): Dr. Francine Graven   Encounter Date: 07/12/2020   PT End of Session - 07/12/20 1633    Visit Number 1    Number of Visits 11    Authorization Type [employer] Medicaid - awaiting auth    Authorization - Number of Visits 10    PT Start Time 1017    PT Stop Time 1100    PT Time Calculation (min) 43 min    Equipment Utilized During Treatment Gait belt    Activity Tolerance Patient tolerated treatment well    Behavior During Therapy WFL for tasks assessed/performed   tearful at times during eval          Past Medical History:  Diagnosis Date  . Anemia    severe  . Asthma   . CKD (chronic kidney disease)    stage IV  . DDD (degenerative disc disease), cervical   . Diabetes mellitus ([place])   . Dislocated shoulder    right  . GERD (gastroesophageal reflux disease)   . Hypertension   . Neuropathy   . Stroke [facility])     Past Surgical History:  Procedure Laterality Date  . ADENOIDECTOMY    . APPENDECTOMY    . CERVICAL SPINE SURGERY  06/2012   C5-C6  . EYE SURGERY     left eye surgery   . FOOT SURGERY    . LOOP RECORDER INSERTION N/A 08/27/2017   Procedure: LOOP RECORDER INSERTION;  Surgeon: Sanda Klein, MD;  Location: [facility];  Service: Cardiovascular;  Laterality: N/A;  . SHOULDER SURGERY    . TEE WITHOUT CARDIOVERSION N/A 08/27/2017   Procedure: TRANSESOPHAGEAL ECHOCARDIOGRAM (TEE);  Surgeon: Sanda Klein, MD;  Location: [facility];  Service: Cardiovascular;  Laterality: N/A;  . TONSILLECTOMY      Vitals:   07/12/20 1023  BP: 140/85  Pulse: 82      Subjective Assessment - 07/12/20 1024    Subjective Pt went to the ER on 06/19/20 with  R sided weakness, aphasia, and  episode of altered consciousness where she fell. MRI showed subacute pontine infarcts. Was discharged 06/22/20. Came to ED shortly after Christmas for this and states that she waited 6 hrs and wasn't seen so went home. Did not receive any home health therapies. States that the senstion in her R leg feels different. Now using a RW to walk, prior to this previous stroke was walking with a SPC or not using anything at all. Had a fall a couple of weeks ago, was walking to go to the bathroom and her legs just gave out.    Pertinent History DM, seizures 2019, HTN, GERD, CKD IV, asthma, neuropathy, anemia, CVA R thalamus 2017    Limitations Walking    Patient Stated Goals wants to be more stable when she walks, improve balance    Currently in Pain? No/denies              [facility] PT Assessment - 07/12/20 1037      Assessment   Medical Diagnosis CVA   subacute pontine infarcts jan 2022, hx of CVA R thalamus from 2017   Referring Provider (PT) Dr. Francine Graven    Onset Date/Surgical Date 06/19/20    Hand Dominance Right    Prior Therapy  previous PT from CVA and seizure      Precautions   Precautions Fall    Precaution Comments hx of seizure in 2019      Balance Screen   Has the patient fallen in the past 6 months Yes    How many times? 3 or 4    Has the patient had a decrease in activity level because of a fear of falling?  Yes    Is the patient reluctant to leave their home because of a fear of falling?  Yes   "sort of"     Home Environment   Living Environment Private residence    Living Arrangements Children   daughter 57 yo   Type of [place] Level entry    [place] One level    [place] - 2 wheels;Shower seat;Cane - single point;Grab bars - toilet;Grab bars - tub/shower;Hand held shower head    Additional Comments not cooking as much, not able to clean because afraid of falling. no issues with dressing/bathing      Prior Function   Level of  Independence Independent with community mobility with device    Vocation On disability      Sensation   Light Touch Impaired Detail    Additional Comments reports more of a dull sensation on R side, reports half of her foot feels "dead" and is a heavy weight. states toes 4 and 5 don't feel right      Coordination   Gross Motor Movements are Fluid and Coordinated No    Heel Shin Test more slowed movement with RLE      ROM / Strength   AROM / PROM / Strength Strength      Strength   Strength Assessment Site Hip;Ankle;Knee    Right/Left Hip Right;Left    Right Hip Flexion 4/5    Left Hip Flexion 5/5    Right/Left Knee Right;Left    Right Knee Flexion 4/5    Right Knee Extension 4+/5    Left Knee Flexion 5/5    Left Knee Extension 5/5    Right/Left Ankle Left;Right    Right Ankle Dorsiflexion 3+/5    Right Ankle Plantar Flexion 5/5      Transfers   Transfers Stand to Sit;Sit to Stand    Sit to Stand 5: Supervision;With upper extremity assist;From chair/3-in-1    Five time sit to stand comments  26.16 seconds with UE support    Stand to Sit 5: Supervision;With upper extremity assist;To chair/3-in-1      Ambulation/Gait   Ambulation/Gait Yes    Ambulation/Gait Assistance 5: Supervision    Ambulation Distance (Feet) --   clinic distances   Assistive device Rolling walker    Gait Pattern Step-through pattern;Decreased stance time - right;Decreased dorsiflexion - right;Decreased weight shift to right;Right foot flat    Ambulation Surface Level;Indoor    Gait velocity 21.37 seconds = 1.53 ft/sec    Gait Comments provided pt with tennis balls for easier propulsion      Standardized Balance Assessment   Standardized Balance Assessment Timed Up and Go Test      Timed Up and Go Test   Normal TUG (seconds) 26.84   with RW            Managed medicaid CPT codes: [phone]- Therapeutic Exercise, [phone]- Neuro Re-education, [phone] - Gait Training, [phone] - Therapeutic Activities, [phone] -  Self Care and [phone] - Orthotic Fit  Objective measurements completed on examination: See above findings.               PT Education - 07/12/20 1632    Education Details POC, evaluation findings, Medicaid visits (27 per calendar year)    Person(s) Educated Patient    Methods Explanation    Comprehension Verbalized understanding            PT Short Term Goals - 07/12/20 1638      PT SHORT TERM GOAL #1   Title Pt will be independent with initial HEP in order to build upon functional gains made in therapy. ALL STGS DUE 08/02/20    Baseline dependent    Time 3    Period Weeks    Status New    Target Date 08/02/20      PT SHORT TERM GOAL #2   Title Pt will undergo BERG with LTG written as appropriate.    Baseline not yet assessed    Time 3    Period Weeks    Status New      PT SHORT TERM GOAL #3   Title Pt will ambulate at least 31' with LRAD with supervision over level indoor surfaces in order to demo improved household mobility.    Baseline ambulates with RW with supervision    Time 3    Period Weeks    Status New      PT SHORT TERM GOAL #4   Title Pt will improve gait speed to at least 1.9 ft/sec with RW vs. LRAD in order to demo decr fall risk.    Baseline 1.53 ft/sec    Time 3    Period Weeks    Status New             PT Long Term Goals - 07/12/20 1641      PT LONG TERM GOAL #1   Title Pt will be independent with final HEP in order to build upon functional gains made in therapy. ALL LTGS DUE 08/30/20    Baseline currently dependent.    Time 7    Period Weeks    Status New    Target Date 08/30/20      PT LONG TERM GOAL #2   Title BERG goal to be written as appropriate.    Baseline not yet assessed    Time 7    Period Weeks    Status New      PT LONG TERM GOAL #3   Title Pt will improve gait speed to at least 2.3 ft/sec with RW vs. LRAD in order to demo decr fall risk.    Baseline 1.53 ft/sec    Time 7    Period Weeks     Status New      PT LONG TERM GOAL #4   Title Pt will decr 5x sit <> stand time to 19 seconds or less to demo improved BLE strength.    Baseline 26.16 seconds    Time 7    Period Weeks    Status New      PT LONG TERM GOAL #5   Title Pt will decr TUG to 20 seconds or less with LRAD in order to demo decr fall risk,    Baseline 26.84 seconds    Time 7    Period Weeks    Status New                  Plan - 07/12/20 1634    Clinical  Impression Statement Patient is a 50 year old female referred to Neuro OPPT for CVA on 06/19/20 with R hemiparesis Pt's PMH is significant for: Rt thalamus stroke 2017, h/o lacunar strokes (01/2020, 2019), DM2, CKD stage 4, HTN, GERD, neuropathy (loss of vision Lt eye from previous stroke.  Prior to most recent CVA, pt was ambulating with no AD or using a cane. Currently pt needing to use a RW for balance. The following deficits were present during the exam:  impaired sensation, decr coordination, gait abnormalities, decr RLE>LLE strength, balance impairments. Based on 5x sit <> Stand, gait speed, and TUG pt is at a high risk for falls. Pt would benefit from skilled PT to address these impairments and functional limitations to maximize functional mobility independence    Personal Factors and Comorbidities Comorbidity 3+;Past/Current Experience;Time since onset of injury/illness/exacerbation    Comorbidities CVA 06/19/20 with Rt hemiparesis. 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 stroke)    Examination-Activity Limitations Stand;Transfers;Locomotion Level;Stairs    Examination-Participation Restrictions Cleaning;Community Activity;Laundry   cooking   Stability/Clinical Decision Making Stable/Uncomplicated    Rehab Potential Good    PT Frequency 2x / week   plus 1x week for 4 weeks   PT Duration 3 weeks   plus 1x week for 4 weeks   PT Treatment/Interventions  ADLs/Self Care Home Management;DME Instruction;Gait training;Stair training;Functional mobility training;Therapeutic activities;Patient/family education;Therapeutic exercise;Balance training;Neuromuscular re-education;Passive range of motion;Orthotic Fit/Training;Vestibular    PT Next Visit Plan initial HEP for strength/balance. perform BERG and write LTG.    Consulted and Agree with Plan of Care Patient           Patient will benefit from skilled therapeutic intervention in order to improve the following deficits and impairments:  Abnormal gait,Decreased activity tolerance,Decreased balance,Decreased endurance,Decreased coordination,Decreased strength,Difficulty walking  Visit Diagnosis: Unsteadiness on feet  Other abnormalities of gait and mobility  Other symptoms and signs involving the nervous system  Muscle weakness (generalized)     Problem List Patient Active Problem List   Diagnosis Date Noted  . Depression 07/05/2020  . CVA (cerebral vascular accident) ([place]) 06/21/2020  . CVA (cerebrovascular accident) ([place]) 06/19/2020  . Abnormality of gait 10/14/2018  . Dizziness and giddiness 09/13/2018  . Debility 06/26/2018  . Chronic nonintractable headache 04/26/2018  . Diastolic dysfunction   . Congestion of nasal sinus   . Pneumonia of left lower lobe due to infectious organism   . SOB (shortness of breath)   . Candidiasis   . CKD (chronic kidney disease) stage 4, GFR 15-29 ml/min (HCC)   . Acute on chronic anemia   . Hypoalbuminemia due to protein-calorie malnutrition ([place])   . Essential hypertension   . Non-insulin dependent type 2 diabetes mellitus ([place])   . PRES (posterior reversible encephalopathy syndrome)   . Hypokalemia   . History of CVA with residual deficit   . History of seizure 03/25/2018  . Bilateral carpal tunnel syndrome 02/14/2018  . Arthritis 10/31/2017  . Asthma 10/31/2017  . Constipation 10/31/2017  . Kidney stone 10/31/2017  . Anemia of chronic  disease 08/09/2017  . Hyperlipidemia 07/26/2017  . 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[place]) 07/25/2017  . Anxiety as acute reaction to exceptional stress 04/23/2017  . Insomnia 10/08/2016  . Neuropathy 06/07/2016  . Gait instability   . Lesion of pons   . Slurred speech   . Dizziness 04/30/2016  . Hyperglycemia 04/30/2016  . Chronic kidney disease (  CKD), stage III (moderate) ([place]) 04/30/2016  . Ataxia 04/30/2016  . Chronic right shoulder pain 04/03/2016  . 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 Rhinitis, allergic 08/25/2015  . Asthma, mild intermittent 07/14/2015  . Gastroesophageal reflux disease without esophagitis 06/08/2015  . Hypertensive cardiovascular disease 05/05/2015  . Vitamin D deficiency 05/05/2015    Arliss Journey, PT, DPT  07/12/2020, 4:48 PM  [facility] [address] [place], [place], [address] Phone: [phone]   Fax:  [phone]  Name: Christina Orozco MRN: [id number] Date of Birth: 05-08-1971

## 2020-07-19 ENCOUNTER — Ambulatory Visit: Payer: Medicaid Other | Admitting: Physical Therapy

## 2020-07-19 ENCOUNTER — Ambulatory Visit: Payer: Medicaid Other | Admitting: Occupational Therapy

## 2020-07-26 ENCOUNTER — Ambulatory Visit: Payer: Medicaid Other | Admitting: Occupational Therapy

## 2020-07-26 ENCOUNTER — Ambulatory Visit: Payer: Medicaid Other | Admitting: Physical Therapy

## 2020-07-28 ENCOUNTER — Ambulatory Visit: Payer: Medicaid Other | Admitting: Occupational Therapy

## 2020-07-28 ENCOUNTER — Encounter: Payer: Self-pay | Admitting: Physical Therapy

## 2020-07-28 ENCOUNTER — Other Ambulatory Visit: Payer: Self-pay

## 2020-07-28 ENCOUNTER — Ambulatory Visit: Payer: Medicaid Other | Attending: Internal Medicine | Admitting: Physical Therapy

## 2020-07-28 VITALS — BP 136/89 | HR 84

## 2020-07-28 DIAGNOSIS — M6281 Muscle weakness (generalized): Secondary | ICD-10-CM

## 2020-07-28 DIAGNOSIS — R278 Other lack of coordination: Secondary | ICD-10-CM

## 2020-07-28 DIAGNOSIS — R29818 Other symptoms and signs involving the nervous system: Secondary | ICD-10-CM | POA: Insufficient documentation

## 2020-07-28 DIAGNOSIS — R2689 Other abnormalities of gait and mobility: Secondary | ICD-10-CM | POA: Diagnosis present

## 2020-07-28 DIAGNOSIS — R2681 Unsteadiness on feet: Secondary | ICD-10-CM | POA: Diagnosis present

## 2020-07-28 DIAGNOSIS — R41840 Attention and concentration deficit: Secondary | ICD-10-CM | POA: Diagnosis present

## 2020-07-28 DIAGNOSIS — M25611 Stiffness of right shoulder, not elsewhere classified: Secondary | ICD-10-CM | POA: Diagnosis present

## 2020-07-28 NOTE — Therapy (Addendum)
[facility] [address] [place], [place], [address] Phone: [phone]   Fax:  [phone]  Occupational Therapy Treatment  Patient Details  Name: Christina Orozco MRN: [id number] Date of Birth: June 01, 1971 Referring Provider (OT): Dr. Francine Graven   Encounter Date: 07/28/2020   OT End of Session - 07/28/20 1101    Visit Number 2    Number of Visits 11    Date for OT Re-Evaluation 08/17/20    Authorization Type [employer] - awaiting auth    OT Start Time 1025   Pt arrived late   OT Stop Time 1100    OT Time Calculation (min) 35 min    Activity Tolerance Patient tolerated treatment well    Behavior During Therapy [facility] for tasks assessed/performed           Past Medical History:  Diagnosis Date  . Anemia    severe  . Asthma   . CKD (chronic kidney disease)    stage IV  . DDD (degenerative disc disease), cervical   . Diabetes mellitus ([place])   . Dislocated shoulder    right  . GERD (gastroesophageal reflux disease)   . Hypertension   . Neuropathy   . Stroke [facility])     Past Surgical History:  Procedure Laterality Date  . ADENOIDECTOMY    . APPENDECTOMY    . CERVICAL SPINE SURGERY  06/2012   C5-C6  . EYE SURGERY     left eye surgery   . FOOT SURGERY    . LOOP RECORDER INSERTION N/A 08/27/2017   Procedure: LOOP RECORDER INSERTION;  Surgeon: Sanda Klein, MD;  Location: [facility];  Service: Cardiovascular;  Laterality: N/A;  . SHOULDER SURGERY    . TEE WITHOUT CARDIOVERSION N/A 08/27/2017   Procedure: TRANSESOPHAGEAL ECHOCARDIOGRAM (TEE);  Surgeon: Sanda Klein, MD;  Location: [facility] ENDOSCOPY;  Service: Cardiovascular;  Laterality: N/A;  . TONSILLECTOMY      There were no vitals filed for this visit.   Subjective Assessment - 07/28/20 1029    Subjective  My two last toes on my Rt foot still feel kinda dead    Pertinent History CVA 07/13/20 with Rt hemiparesis. PMH: Rt thalamus stroke  2017, h/o lacunar strokes (01/2020, 2019), DM2, CKD stage 4, HTN, GERD, neuropathy (loss of vision Lt eye from previous stroke)    Limitations no driving, fall risk    Currently in Pain? No/denies            see below and pt instructions for details                    OT Education - 07/28/20 1053    Education Details Coordination HEP, Shoulder ex    Person(s) Educated Patient    Methods Explanation;Demonstration;Handout;Verbal cues    Comprehension Verbalized understanding;Returned demonstration               OT Long Term Goals - 07/28/20 1101      OT LONG TERM GOAL #1   Title Independent with HEP for Rt hand coordination (including writing) and shoulder    Baseline dependent    Time 5    Period Weeks    Status Achieved      OT LONG TERM GOAL #2   Title Improve coordination Rt hand as evidenced by reducing speed on 9 hole peg test to 35 sec or less    Baseline 40.72 sec    Time 5  Period Weeks    Status New      OT LONG TERM GOAL #3   Title Pt to write 3 sentences in cursive maintaining 100% legibility    Baseline currently only writing in print    Time 5    Period Weeks    Status New      OT LONG TERM GOAL #4   Title Pt to verbalize understanding with memory compensatory strategies and possible A/E to making cooking easier/safer for peeling and cutting    Baseline dependent    Time 5    Period Weeks    Status New      OT LONG TERM GOAL #5   Title Pt to consistently perform cooking tasks at mod I level    Baseline only microwaveable and cold prep at this time    Time 5    Period Weeks    Status New                 Plan - 07/28/20 1102    Clinical Impression Statement Pt met LTG #1. Pt reports Rt shoulder is better and peeling and cutting have gotten easier    OT Occupational Profile and History Detailed Assessment- Review of Records and additional review of physical, cognitive, psychosocial history related to current functional  performance    Occupational performance deficits (Please refer to evaluation for details): IADL's;Leisure    Body Structure / Function / Physical Skills ADL;UE functional use;Strength;Coordination;FMC;IADL;ROM    Cognitive Skills Memory    Rehab Potential Good    Clinical Decision Making Several treatment options, min-mod task modification necessary    Comorbidities Affecting Occupational Performance: May have comorbidities impacting occupational performance    Modification or Assistance to Complete Evaluation  No modification of tasks or assist necessary to complete eval    OT Frequency 2x / week    OT Duration --   5 WEEKS (plus eval)   OT Treatment/Interventions Self-care/ADL training;DME and/or AE instruction;Moist Heat;Therapeutic activities;Therapeutic exercise;Cognitive remediation/compensation;Coping strategies training;Functional Mobility Training;Passive range of motion;Neuromuscular education;Patient/family education;Manual Therapy;Visual/perceptual remediation/compensation;Paraffin;Fluidtherapy    Plan progress towards remaining goals, memory strategies, continue coordination    Consulted and Agree with Plan of Care Patient           Patient will benefit from skilled therapeutic intervention in order to improve the following deficits and impairments:   Body Structure / Function / Physical Skills: ADL,UE functional use,Strength,Coordination,FMC,IADL,ROM Cognitive Skills: Memory     Visit Diagnosis: Other lack of coordination  Stiffness of right shoulder, not elsewhere classified    Problem List Patient Active Problem List   Diagnosis Date Noted  . Depression 07/05/2020  . CVA (cerebral vascular accident) ([place]) 06/21/2020  . CVA (cerebrovascular accident) ([place]) 06/19/2020  . Abnormality of gait 10/14/2018  . Dizziness and giddiness 09/13/2018  . Debility 06/26/2018  . Chronic nonintractable headache 04/26/2018  . Diastolic dysfunction   . Congestion of nasal sinus    . Pneumonia of left lower lobe due to infectious organism   . SOB (shortness of breath)   . Candidiasis   . CKD (chronic kidney disease) stage 4, GFR 15-29 ml/min (HCC)   . Acute on chronic anemia   . Hypoalbuminemia due to protein-calorie malnutrition ([place])   . Essential hypertension   . Non-insulin dependent type 2 diabetes mellitus ([place])   . PRES (posterior reversible encephalopathy syndrome)   . Hypokalemia   . History of CVA with residual deficit   . History of seizure 03/25/2018  .  Bilateral carpal tunnel syndrome 02/14/2018  . Arthritis 10/31/2017  . Asthma 10/31/2017  . Constipation 10/31/2017  . Kidney stone 10/31/2017  . Anemia of chronic disease 08/09/2017  . Hyperlipidemia 07/26/2017  . 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[place]) 07/25/2017  . Anxiety as acute reaction to exceptional stress 04/23/2017  . Insomnia 10/08/2016  . Neuropathy 06/07/2016  . Gait instability   . Lesion of pons   . Slurred speech   . Dizziness 04/30/2016  . Hyperglycemia 04/30/2016  . Chronic kidney disease (CKD), stage III (moderate) ([place]) 04/30/2016  . Ataxia 04/30/2016  . Chronic right shoulder pain 04/03/2016  . 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 Rhinitis, allergic 08/25/2015  . Asthma, mild intermittent 07/14/2015  . Gastroesophageal reflux disease without esophagitis 06/08/2015  . Hypertensive cardiovascular disease 05/05/2015  . Vitamin D deficiency 05/05/2015    Carey Bullocks, OTR/L 07/28/2020, 11:49 AM  [facility] [address] [place], [place], [address] Phone: [phone]   Fax:  [phone]  Name: Audreena Sachdeva MRN: [id number] Date of Birth: 1970-08-04

## 2020-07-28 NOTE — Patient Instructions (Addendum)
  Coordination Activities  Perform the following activities for 10 minutes 1-2 times per day with right hand(s).   Rotate ball in fingertips (clockwise and counter-clockwise x 3 full revolutions each way).  Toss ball in air and catch with the same hand.  Flip cards 1 at a time as fast as you can.  Deal cards with your thumb (Hold deck in hand and push card off top with thumb).  Rotate one card in hand (clockwise and counter-clockwise).  Pick up coins one at a time until you get 5in your hand, then move coins from palm to fingertips to stack one at a time. Do 3 stacks of 5  Practice writing in cursive   SHOULDER: Flexion Bilateral    Raise arms overhead at same speed. Keep elbows straight. Hold a shoebox or a full paper towel roll  _10__ reps per set, _2__ sets per day

## 2020-07-28 NOTE — Patient Instructions (Signed)
Access Code: [email or web address] URL: [email or web address] Date: 07/28/2020 Prepared by: Willow Ora  Exercises Sit to Stand with Arms Crossed - 1 x daily - 5 x weekly - 1 sets - 10 reps Heel Toe Raises with Counter Support - 1 x daily - 5 x weekly - 1 sets - 10 reps Standing Near Stance in Corner with Eyes Closed - 1 x daily - 5 x weekly - 1 sets - 3 reps - 30 hold

## 2020-07-29 NOTE — Therapy (Signed)
[facility] [address] [place], [place], [address] Phone: [phone]   Fax:  [phone]  Physical Therapy Treatment  Patient Details  Name: Christina Orozco MRN: [id number] Date of Birth: Oct 24, 1970 Referring Provider (PT): Dr. Francine Graven   Encounter Date: 07/28/2020   PT End of Session - 07/28/20 1108    Visit Number 2    Number of Visits 11    Authorization Type BCBS Medicaid - awaiting auth    Authorization - Number of Visits 10    PT Start Time 1104    PT Stop Time 1143    PT Time Calculation (min) 39 min    Equipment Utilized During Treatment Gait belt    Activity Tolerance Patient tolerated treatment well    Behavior During Therapy WFL for tasks assessed/performed   tearful at times during eval          Past Medical History:  Diagnosis Date  . Anemia    severe  . Asthma   . CKD (chronic kidney disease)    stage IV  . DDD (degenerative disc disease), cervical   . Diabetes mellitus ([place])   . Dislocated shoulder    right  . GERD (gastroesophageal reflux disease)   . Hypertension   . Neuropathy   . Stroke [facility])     Past Surgical History:  Procedure Laterality Date  . ADENOIDECTOMY    . APPENDECTOMY    . CERVICAL SPINE SURGERY  06/2012   C5-C6  . EYE SURGERY     left eye surgery   . FOOT SURGERY    . LOOP RECORDER INSERTION N/A 08/27/2017   Procedure: LOOP RECORDER INSERTION;  Surgeon: Sanda Klein, MD;  Location: [facility];  Service: Cardiovascular;  Laterality: N/A;  . SHOULDER SURGERY    . TEE WITHOUT CARDIOVERSION N/A 08/27/2017   Procedure: TRANSESOPHAGEAL ECHOCARDIOGRAM (TEE);  Surgeon: Sanda Klein, MD;  Location: [facility];  Service: Cardiovascular;  Laterality: N/A;  . TONSILLECTOMY      Vitals:   07/28/20 1106  BP: 136/89  Pulse: 84     Subjective Assessment - 07/28/20 1106    Subjective No new complaints. No falls to report. Continues to have right toe  pain.    Pertinent History DM, seizures 2019, HTN, GERD, CKD IV, asthma, neuropathy, anemia, CVA R thalamus 2017    Patient Stated Goals wants to be more stable when she walks, improve balance    Currently in Pain? Yes    Pain Score 5     Pain Location Toe (Comment which one)   4 and 5th toes   Pain Orientation Right    Pain Descriptors / Indicators Heaviness;Nagging    Pain Type Acute pain    Pain Onset 1 to 4 weeks ago    Pain Frequency Constant    Aggravating Factors  "just there constantly"    Pain Relieving Factors noting so far              [facility] PT Assessment - 07/28/20 1112      Standardized Balance Assessment   Standardized Balance Assessment Berg Balance Test      Berg Balance Test   Sit to Stand Able to stand without using hands and stabilize independently    Standing Unsupported Able to stand safely 2 minutes    Sitting with Back Unsupported but Feet Supported on Floor or Stool Able to sit safely and securely 2 minutes    Stand to Sit Sits  safely with minimal use of hands    Transfers Able to transfer safely, definite need of hands    Standing Unsupported with Eyes Closed Able to stand 10 seconds with supervision    Standing Unsupported with Feet Together Able to place feet together independently but unable to hold for 30 seconds   increased lateral sway, min assist after ~20 sec's   From Standing, Reach Forward with Outstretched Arm Can reach confidently >25 cm (10")    From Standing Position, Pick up Object from Floor Able to pick up shoe safely and easily    From Standing Position, Turn to Look Behind Over each Shoulder Looks behind one side only/other side shows less weight shift   right>left side   Turn 360 Degrees Able to turn 360 degrees safely but slowly   > 6 sec's both ways   Standing Unsupported, Alternately Place Feet on Step/Stool Able to stand independently and safely and complete 8 steps in 20 seconds   16.84 sec's   Standing Unsupported, One Foot in  Front Able to plae foot ahead of the other independently and hold 30 seconds    Standing on One Leg Able to lift leg independently and hold equal to or more than 3 seconds    Total Score 46    Berg comment: 46/56= moderate risk category                [facility] Adult PT Treatment/Exercise - 07/28/20 1112      Transfers   Transfers Stand to Sit;Sit to Stand    Sit to Stand 5: Supervision;With upper extremity assist;From chair/3-in-1    Stand to Sit 5: Supervision;With upper extremity assist;To chair/3-in-1      Ambulation/Gait   Ambulation/Gait Yes    Ambulation/Gait Assistance 5: Supervision    Ambulation/Gait Assistance Details around gym with session    Assistive device Rolling walker    Gait Pattern Step-through pattern;Decreased stance time - right;Decreased dorsiflexion - right;Decreased weight shift to right;Right foot flat    Ambulation Surface Level;Indoor      Exercises   Exercises Other Exercises    Other Exercises  issued an HEP to address strengthening and balance. Refer to [place] for full details. Min guard assist for safety with cues on correct ex form/technique.          Issued to HEP this session:   Access Code: [email or web address] URL: [email or web address] Date: 07/28/2020 Prepared by: Willow Ora  Exercises Sit to Stand with Arms Crossed - 1 x daily - 5 x weekly - 1 sets - 10 reps Heel Toe Raises with Counter Support - 1 x daily - 5 x weekly - 1 sets - 10 reps Standing Near Stance in Corner with Eyes Closed - 1 x daily - 5 x weekly - 1 sets - 3 reps - 30 hold       PT Education - 07/29/20 1739    Education Details results of Berg Balance Test; Initial HEP for strengthening/balance    Person(s) Educated Patient    Methods Explanation;Demonstration;Verbal cues;Handout    Comprehension Verbalized understanding;Returned demonstration;Verbal cues required;Need further instruction            PT Short Term Goals - 07/12/20 1638      PT SHORT  TERM GOAL #1   Title Pt will be independent with initial HEP in order to build upon functional gains made in therapy. ALL STGS DUE 08/02/20    Baseline dependent    Time 3    Period  Weeks    Status New    Target Date 08/02/20      PT SHORT TERM GOAL #2   Title Pt will undergo BERG with LTG written as appropriate.    Baseline not yet assessed    Time 3    Period Weeks    Status New      PT SHORT TERM GOAL #3   Title Pt will ambulate at least 14' with LRAD with supervision over level indoor surfaces in order to demo improved household mobility.    Baseline ambulates with RW with supervision    Time 3    Period Weeks    Status New      PT SHORT TERM GOAL #4   Title Pt will improve gait speed to at least 1.9 ft/sec with RW vs. LRAD in order to demo decr fall risk.    Baseline 1.53 ft/sec    Time 3    Period Weeks    Status New             PT Long Term Goals - 07/12/20 1641      PT LONG TERM GOAL #1   Title Pt will be independent with final HEP in order to build upon functional gains made in therapy. ALL LTGS DUE 08/30/20    Baseline currently dependent.    Time 7    Period Weeks    Status New    Target Date 08/30/20      PT LONG TERM GOAL #2   Title BERG goal to be written as appropriate.    Baseline not yet assessed    Time 7    Period Weeks    Status New      PT LONG TERM GOAL #3   Title Pt will improve gait speed to at least 2.3 ft/sec with RW vs. LRAD in order to demo decr fall risk.    Baseline 1.53 ft/sec    Time 7    Period Weeks    Status New      PT LONG TERM GOAL #4   Title Pt will decr 5x sit <> stand time to 19 seconds or less to demo improved BLE strength.    Baseline 26.16 seconds    Time 7    Period Weeks    Status New      PT LONG TERM GOAL #5   Title Pt will decr TUG to 20 seconds or less with LRAD in order to demo decr fall risk,    Baseline 26.84 seconds    Time 7    Period Weeks    Status New                 Plan -  07/28/20 1109    Clinical Impression Statement Today's skilled session initially focused on setting baseline score for Berg Balance Test with pt scoring 46/56. Remainder of session focused on establishment of an HEP for strengthening and balance. No issues noted or reported in session. The pt is progressing toward goals and should benefit from continued PT to progress toward unmet goals.    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 stroke)    Examination-Activity Limitations Stand;Transfers;Locomotion Level;Stairs    Examination-Participation Restrictions Cleaning;Community Activity;Laundry   cooking   Stability/Clinical Decision  Making Stable/Uncomplicated    Rehab Potential Good    PT Frequency 2x / week   plus 1x week for 4 weeks   PT Duration 3 weeks   plus 1x week for 4 weeks   PT Treatment/Interventions ADLs/Self Care Home Management;DME Instruction;Gait training;Stair training;Functional mobility training;Therapeutic activities;Patient/family education;Therapeutic exercise;Balance training;Neuromuscular re-education;Passive range of motion;Orthotic Fit/Training;Vestibular    PT Next Visit Plan PT to set goals for [facility] Test performed today. Adde to HEP as indicated. Continue to work on strengthening and balance with decreased UE support.    PT Home Exercise Plan Access Code: [email or web address] and Agree with Plan of Care Patient           Patient will benefit from skilled therapeutic intervention in order to improve the following deficits and impairments:  Abnormal gait,Decreased activity tolerance,Decreased balance,Decreased endurance,Decreased coordination,Decreased strength,Difficulty walking  Visit  Diagnosis: Unsteadiness on feet  Other abnormalities of gait and mobility  Other symptoms and signs involving the nervous system  Muscle weakness (generalized)     Problem List Patient Active Problem List   Diagnosis Date Noted  . Depression 07/05/2020  . CVA (cerebral vascular accident) ([place]) 06/21/2020  . CVA (cerebrovascular accident) ([place]) 06/19/2020  . Abnormality of gait 10/14/2018  . Dizziness and giddiness 09/13/2018  . Debility 06/26/2018  . Chronic nonintractable headache 04/26/2018  . Diastolic dysfunction   . Congestion of nasal sinus   . Pneumonia of left lower lobe due to infectious organism   . SOB (shortness of breath)   . Candidiasis   . CKD (chronic kidney disease) stage 4, GFR 15-29 ml/min (HCC)   . Acute on chronic anemia   . Hypoalbuminemia due to protein-calorie malnutrition ([place])   . Essential hypertension   . Non-insulin dependent type 2 diabetes mellitus ([place])   . PRES (posterior reversible encephalopathy syndrome)   . Hypokalemia   . History of CVA with residual deficit   . History of seizure 03/25/2018  . Bilateral carpal tunnel syndrome 02/14/2018  . Arthritis 10/31/2017  . Asthma 10/31/2017  . Constipation 10/31/2017  . Kidney stone 10/31/2017  . Anemia of chronic disease 08/09/2017  . Hyperlipidemia 07/26/2017  . 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[place]) 07/25/2017  . Anxiety as acute reaction to exceptional stress 04/23/2017  . Insomnia 10/08/2016  . Neuropathy 06/07/2016  . Gait instability   . Lesion of pons   . Slurred speech   . Dizziness 04/30/2016  . Hyperglycemia 04/30/2016  . Chronic kidney disease (CKD), stage III (moderate) ([place]) 04/30/2016  . Ataxia 04/30/2016  . Chronic right shoulder pain 04/03/2016  . 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 Rhinitis, allergic 08/25/2015  . Asthma, mild intermittent 07/14/2015  . Gastroesophageal reflux disease without esophagitis 06/08/2015  . Hypertensive cardiovascular disease 05/05/2015   . Vitamin D deficiency 05/05/2015    Willow Ora, PTA, [facility] [address], [place], [place] [address] [phone] 07/29/20, 5:58 PM   Name: Christina Orozco MRN: [id number] Date of Birth: 1970/12/03

## 2020-08-02 ENCOUNTER — Encounter: Payer: Self-pay | Admitting: Physical Therapy

## 2020-08-02 ENCOUNTER — Other Ambulatory Visit: Payer: Self-pay

## 2020-08-02 ENCOUNTER — Encounter: Payer: Self-pay | Admitting: Occupational Therapy

## 2020-08-02 ENCOUNTER — Ambulatory Visit: Payer: Medicaid Other | Admitting: Occupational Therapy

## 2020-08-02 ENCOUNTER — Ambulatory Visit: Payer: Medicaid Other | Admitting: Physical Therapy

## 2020-08-02 DIAGNOSIS — M25611 Stiffness of right shoulder, not elsewhere classified: Secondary | ICD-10-CM

## 2020-08-02 DIAGNOSIS — R2681 Unsteadiness on feet: Secondary | ICD-10-CM

## 2020-08-02 DIAGNOSIS — R278 Other lack of coordination: Secondary | ICD-10-CM

## 2020-08-02 DIAGNOSIS — R29818 Other symptoms and signs involving the nervous system: Secondary | ICD-10-CM

## 2020-08-02 DIAGNOSIS — M6281 Muscle weakness (generalized): Secondary | ICD-10-CM

## 2020-08-02 DIAGNOSIS — R41840 Attention and concentration deficit: Secondary | ICD-10-CM

## 2020-08-02 DIAGNOSIS — R2689 Other abnormalities of gait and mobility: Secondary | ICD-10-CM

## 2020-08-02 NOTE — Therapy (Signed)
[facility] [address] [place], [place], [address] Phone: [phone]   Fax:  [phone]  Physical Therapy Treatment  Patient Details  Name: Christina Orozco MRN: [id number] Date of Birth: [birth year] Referring Provider (PT): Dr. Francine Graven   Encounter Date: 08/02/2020   PT End of Session - 08/02/20 1316    Visit Number 3    Number of Visits 11    Authorization Type BCBS Medicaid - [place] for 7 PT visits 07/19/20 - 08/30/20    Authorization - Visit Number 2    Authorization - Number of Visits 7    PT Start Time [id number]    PT Stop Time 1314    PT Time Calculation (min) 40 min    Equipment Utilized During Treatment Gait belt    Activity Tolerance Patient tolerated treatment well;Patient limited by fatigue    Behavior During Therapy [facility] for tasks assessed/performed           Past Medical History:  Diagnosis Date  . Anemia    severe  . Asthma   . CKD (chronic kidney disease)    stage IV  . DDD (degenerative disc disease), cervical   . Diabetes mellitus ([place])   . Dislocated shoulder    right  . GERD (gastroesophageal reflux disease)   . Hypertension   . Neuropathy   . Stroke [facility])     Past Surgical History:  Procedure Laterality Date  . ADENOIDECTOMY    . APPENDECTOMY    . CERVICAL SPINE SURGERY  06/2012   C5-C6  . EYE SURGERY     left eye surgery   . FOOT SURGERY    . LOOP RECORDER INSERTION N/A 08/27/2017   Procedure: LOOP RECORDER INSERTION;  Surgeon: Sanda Klein, MD;  Location: [facility];  Service: Cardiovascular;  Laterality: N/A;  . SHOULDER SURGERY    . TEE WITHOUT CARDIOVERSION N/A 08/27/2017   Procedure: TRANSESOPHAGEAL ECHOCARDIOGRAM (TEE);  Surgeon: Sanda Klein, MD;  Location: [facility];  Service: Cardiovascular;  Laterality: N/A;  . TONSILLECTOMY      There were no vitals filed for this visit.   Subjective Assessment - 08/02/20 1236    Subjective Doesn't think she has  had any falls. Exercises are going well.    Pertinent History DM, seizures 2019, HTN, GERD, CKD IV, asthma, neuropathy, anemia, CVA R thalamus 2017    Patient Stated Goals wants to be more stable when she walks, improve balance    Currently in Pain? Yes    Pain Score 6     Pain Location Toe (Comment which one)   4th and 5th toes   Pain Orientation Right    Pain Descriptors / Indicators Heaviness;Nagging    Pain Type Acute pain    Pain Onset 1 to 4 weeks ago    Aggravating Factors  "just there constantly"    Pain Relieving Factors nothing                             [facility] Adult PT Treatment/Exercise - 08/02/20 0001      Ambulation/Gait   Ambulation/Gait Yes    Ambulation/Gait Assistance 5: Supervision    Ambulation/Gait Assistance Details around gym with RW, cues for heel strike    Assistive device Rolling walker    Gait Pattern Step-through pattern;Decreased stance time - right;Decreased dorsiflexion - right;Decreased weight shift to right;Right foot flat    Ambulation Surface Level;Indoor  Exercises   Exercises Other Exercises    Other Exercises  seated LAQs: 2 x 10 reps with 2# ankle weight with RLE, x10 reps seated marching with RLE #2 ankle weight               Balance Exercises - 08/02/20 0001      Balance Exercises: Standing   Standing Eyes Opened Foam/compliant surface;Limitations    Standing Eyes Opened Limitations feet hip width x10 reps head turns (reporting 2/10 dizziness afterwards) - subsided immediately with rest, x10 reps head nods    Standing Eyes Closed Wide (BOA);Foam/compliant surface;30 secs   standing on air ex   SLS with Vectors Solid surface;Upper extremity assist 2;Limitations    SLS with Vectors Limitations with BUE support :alternating toe taps to forward cone x7 reps B, then 2 x 5 reps repeating same activity with fingertip support          Access Code: [email or web address] URL: [email or web address] Date:  08/02/2020 Prepared by: Janann August  Bolded below are new additions to HEP:   Exercises Sit to Stand with Arms Crossed - 1 x daily - 5 x weekly - 1 sets - 10 reps Heel Toe Raises with Counter Support - 1 x daily - 5 x weekly - 1 sets - 10 reps Standing Near Stance in Corner with Eyes Closed - 1 x daily - 5 x weekly - 1 sets - 3 reps - 30 hold Standing Marching - 1 x daily - 5 x weekly - 2 sets - 10 reps - holding on to countertop, cues for slowed at controlled, standing in place Side Stepping with Counter Support - 1 x daily - 5 x weekly - 3 sets - fingertip support as needed  Standing dynamic marching with single UE support down and back at the countertop x2 reps.    PT Education - 08/02/20 1316    Education Details new additions to HEP    Person(s) Educated Patient    Methods Explanation;Demonstration;Handout    Comprehension Verbalized understanding;Returned demonstration            PT Short Term Goals - 08/02/20 1321      PT SHORT TERM GOAL #1   Title Pt will be independent with initial HEP in order to build upon functional gains made in therapy. ALL STGS DUE 08/02/20    Baseline dependent    Time 3    Period Weeks    Status New    Target Date 08/02/20      PT SHORT TERM GOAL #2   Title Pt will undergo BERG with LTG written as appropriate.    Baseline 46/56 on 08/02/20    Time 3    Period Weeks    Status Achieved      PT SHORT TERM GOAL #3   Title Pt will ambulate at least 41' with LRAD with supervision over level indoor surfaces in order to demo improved household mobility.    Baseline ambulates with RW with supervision    Time 3    Period Weeks    Status New      PT SHORT TERM GOAL #4   Title Pt will improve gait speed to at least 1.9 ft/sec with RW vs. LRAD in order to demo decr fall risk.    Baseline 1.53 ft/sec    Time 3    Period Weeks    Status New             PT Long Term Goals -  08/02/20 1321      PT LONG TERM GOAL #1   Title Pt will be  independent with final HEP in order to build upon functional gains made in therapy. ALL LTGS DUE 08/30/20    Baseline currently dependent.    Time 7    Period Weeks    Status New      PT LONG TERM GOAL #2   Title Pt will improve BERG to at least a 50/56 in order to demo decr fall risk.    Baseline 46/56 on 08/02/20    Time 7    Period Weeks    Status Revised      PT LONG TERM GOAL #3   Title Pt will improve gait speed to at least 2.3 ft/sec with RW vs. LRAD in order to demo decr fall risk.    Baseline 1.53 ft/sec    Time 7    Period Weeks    Status New      PT LONG TERM GOAL #4   Title Pt will decr 5x sit <> stand time to 19 seconds or less to demo improved BLE strength.    Baseline 26.16 seconds    Time 7    Period Weeks    Status New      PT LONG TERM GOAL #5   Title Pt will decr TUG to 20 seconds or less with LRAD in order to demo decr fall risk,    Baseline 26.84 seconds    Time 7    Period Weeks    Status New                 Plan - 08/02/20 1318    Clinical Impression Statement Added standing marching and side stepping at the countertop for pt's HEP. Remainder of session focused on RLE>LLE strenghtening and standing balance strategies. Pt needing seated rest breaks throughout session due to fatigue. Will continue to progress towards LTGs.    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 stroke)    Examination-Activity Limitations Stand;Transfers;Locomotion Level;Stairs    Examination-Participation Restrictions Cleaning;Community Activity;Laundry   cooking   Stability/Clinical Decision Making Stable/Uncomplicated    Rehab Potential Good    PT Frequency 2x / week   plus 1x week for 4 weeks   PT  Duration 3 weeks   plus 1x week for 4 weeks   PT Treatment/Interventions ADLs/Self Care Home Management;DME Instruction;Gait training;Stair training;Functional mobility training;Therapeutic activities;Patient/family education;Therapeutic exercise;Balance training;Neuromuscular re-education;Passive range of motion;Orthotic Fit/Training;Vestibular    PT Next Visit Plan how is HEP? SciFit/NuStep. Continue to work on strengthening and balance with decreased UE support.    PT Home Exercise Plan Access Code: [email or web address] and Agree with Plan of Care Patient           Patient will benefit from skilled therapeutic intervention in order to improve the following deficits and impairments:  Abnormal gait,Decreased activity tolerance,Decreased balance,Decreased endurance,Decreased coordination,Decreased strength,Difficulty walking  Visit Diagnosis: Other lack of coordination  Unsteadiness on feet  Other abnormalities of gait and mobility  Other symptoms and signs involving the nervous system  Muscle weakness (generalized)     Problem List Patient Active Problem List   Diagnosis Date Noted  . Depression 07/05/2020  . CVA (cerebral vascular accident) ([place]) 06/21/2020  .  CVA (cerebrovascular accident) ([place]) 06/19/2020  . Abnormality of gait 10/14/2018  . Dizziness and giddiness 09/13/2018  . Debility 06/26/2018  . Chronic nonintractable headache 04/26/2018  . Diastolic dysfunction   . Congestion of nasal sinus   . Pneumonia of left lower lobe due to infectious organism   . SOB (shortness of breath)   . Candidiasis   . CKD (chronic kidney disease) stage 4, GFR 15-29 ml/min (HCC)   . Acute on chronic anemia   . Hypoalbuminemia due to protein-calorie malnutrition ([place])   . Essential hypertension   . Non-insulin dependent type 2 diabetes mellitus ([place])   . PRES (posterior reversible encephalopathy syndrome)   . Hypokalemia   . History of CVA with residual deficit   . History of  seizure 03/25/2018  . Bilateral carpal tunnel syndrome 02/14/2018  . Arthritis 10/31/2017  . Asthma 10/31/2017  . Constipation 10/31/2017  . Kidney stone 10/31/2017  . Anemia of chronic disease 08/09/2017  . Hyperlipidemia 07/26/2017  . 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[place]) 07/25/2017  . Anxiety as acute reaction to exceptional stress 04/23/2017  . Insomnia 10/08/2016  . Neuropathy 06/07/2016  . Gait instability   . Lesion of pons   . Slurred speech   . Dizziness 04/30/2016  . Hyperglycemia 04/30/2016  . Chronic kidney disease (CKD), stage III (moderate) ([place]) 04/30/2016  . Ataxia 04/30/2016  . Chronic right shoulder pain 04/03/2016  . 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 Rhinitis, allergic 08/25/2015  . Asthma, mild intermittent 07/14/2015  . Gastroesophageal reflux disease without esophagitis 06/08/2015  . Hypertensive cardiovascular disease 05/05/2015  . Vitamin D deficiency 05/05/2015    Arliss Journey, PT, DPT 08/02/2020, 1:22 PM  [facility] [address] [place], [place], [address] Phone: [phone]   Fax:  [phone]  Name: Christina Orozco MRN: [id number] Date of Birth: [birth year]

## 2020-08-02 NOTE — Patient Instructions (Signed)
Memory Compensation Strategies  1. Use "WARM" strategy. W= write it down A=  associate it R=  repeat it M=  make a mental picture  2. You can keep a Memory Notebook. Use a 3-ring notebook with sections for the following:  calendar, important names and phone numbers, medications, doctors' names/phone numbers, "to do list"/reminders, and a section to journal what you did each day  3. Use a calendar to write appointments down.  4. Write yourself a schedule for the day.  This can be placed on the calendar or in a separate section of the Memory Notebook.  Keeping a regular schedule can help memory.  5. Use medication organizer with sections for each day or morning/evening pills  You may need help loading it  6. Keep a basket, or pegboard by the door.   Place items that you need to take out with you in the basket or on the pegboard.  You may also want to include a message board for reminders.  7. Use sticky notes. Place sticky notes with reminders in a place where the task is performed.  For example:  "turn off the stove" placed by the stove, "lock the door" placed on the door at eye level, "take your medications" on the bathroom mirror or by the place where you normally take your medications  8. Use alarms/timers.  Use while cooking to remind yourself to check on food or as a reminder to take your medicine, or as a reminder to make a call, or as a reminder to perform another task, etc.  9. Use a voice recorder app or small tape recorder to record important information and notes for yourself. Go back at the end of the day and listen to these.  

## 2020-08-02 NOTE — Patient Instructions (Signed)
Access Code: [email or web address] URL: [email or web address] Date: 08/02/2020 Prepared by: Janann August  Exercises Sit to Stand with Arms Crossed - 1 x daily - 5 x weekly - 1 sets - 10 reps Heel Toe Raises with Counter Support - 1 x daily - 5 x weekly - 1 sets - 10 reps Standing Near Stance in Corner with Eyes Closed - 1 x daily - 5 x weekly - 1 sets - 3 reps - 30 hold Standing Marching - 1 x daily - 5 x weekly - 2 sets - 10 reps Side Stepping with Counter Support - 1 x daily - 5 x weekly - 3 sets

## 2020-08-02 NOTE — Therapy (Signed)
[facility] [address] [place], [place], [address] Phone: [phone]   Fax:  [phone]  Occupational Therapy Treatment  Patient Details  Name: Christina Orozco MRN: [id number] Date of Birth: April 14, 1971 Referring Provider (OT): Dr. Francine Graven   Encounter Date: 08/02/2020   OT End of Session - 08/02/20 1319    Visit Number 3    Number of Visits 11    Date for OT Re-Evaluation 08/17/20    Authorization Type [place] [employer]    Authorization Time Period 10 OT visits Auth 07/19/2020-09/01/2020 VL:27 PT/OT/ST $3 copay    Authorization - Visit Number 2    Authorization - Number of Visits 10    OT Start Time 1316    OT Stop Time 1400    OT Time Calculation (min) 44 min    Activity Tolerance Patient tolerated treatment well    Behavior During Therapy [facility] for tasks assessed/performed           Past Medical History:  Diagnosis Date  . Anemia    severe  . Asthma   . CKD (chronic kidney disease)    stage IV  . DDD (degenerative disc disease), cervical   . Diabetes mellitus ([place])   . Dislocated shoulder    right  . GERD (gastroesophageal reflux disease)   . Hypertension   . Neuropathy   . Stroke [facility])     Past Surgical History:  Procedure Laterality Date  . ADENOIDECTOMY    . APPENDECTOMY    . CERVICAL SPINE SURGERY  06/2012   C5-C6  . EYE SURGERY     left eye surgery   . FOOT SURGERY    . LOOP RECORDER INSERTION N/A 08/27/2017   Procedure: LOOP RECORDER INSERTION;  Surgeon: Sanda Klein, MD;  Location: [place] [facility];  Service: Cardiovascular;  Laterality: N/A;  . SHOULDER SURGERY    . TEE WITHOUT CARDIOVERSION N/A 08/27/2017   Procedure: TRANSESOPHAGEAL ECHOCARDIOGRAM (TEE);  Surgeon: Sanda Klein, MD;  Location[facility] END[facility];  Service: Cardiovascular;  Laterality: N/A;  . TONSILLECTOMY      There were no vitals filed for this visit.   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 Yes    Pain Score 6     Pain Location Toe (Comment which one)   last two on right   Pain Orientation Right    Pain Descriptors / Indicators Heaviness;Nagging    Pain Type Acute pain    Pain Onset 1 to 4 weeks ago    Pain Frequency Constant                        OT Treatments/Exercises (OP) - 08/02/20 1321      Exercises   Exercises Hand      Fine Motor Coordination (Hand/Wrist)   Fine Motor Coordination Grooved pegs;In hand manipuation training;Handwriting;Small Pegboard    In Hand Manipulation Training with golf balls - can go clockwise witht RUE but unable to do counteclockwise    Small Pegboard with RUE with min difficulty and min drops    Handwriting lower case "l"'s across page x 3 to  increase fluidity with handwriting for increasing legibility with cursive. Pt wrote name x 2 and sentence with 90% legibility in cursive. Pt reports increased legibility and ability to write closer to baseline    Grooved pegs with RUE with min difficulty and min drops. Pt required increased time. Removed with use of tweezers with increased difficulty.                  OT Education - 08/02/20 1339    Education Details Memory Compensation Strategies    Person(s) Educated Patient    Methods Explanation;Demonstration;Handout;Verbal cues    Comprehension Verbalized understanding;Returned demonstration               OT Long Term Goals - 08/02/20 1345      OT LONG TERM GOAL #1   Title Independent with HEP for Rt hand coordination (including writing) and shoulder    Baseline dependent    Time 5    Period Weeks    Status Achieved      OT LONG TERM GOAL #2   Title Improve  coordination Rt hand as evidenced by reducing speed on 9 hole peg test to 35 sec or less    Baseline 40.72 sec    Time 5    Period Weeks    Status On-going      OT LONG TERM GOAL #3   Title Pt to write 3 sentences in cursive maintaining 100% legibility    Baseline currently only writing in print    Time 5    Period Weeks    Status On-going   90%     OT LONG TERM GOAL #4   Title Pt to verbalize understanding with memory compensatory strategies and possible A/E to making cooking easier/safer for peeling and cutting    Baseline dependent    Time 5    Period Weeks    Status On-going   went over memory strategies 08/02/2020     OT LONG TERM GOAL #5   Title Pt to consistently perform cooking tasks at mod I level    Baseline only microwaveable and cold prep at this time    Time 5    Period Weeks    Status On-going   pt completing some cooking at home and reporting its taking longer but it is getting easier                Plan - 08/02/20 1329    Clinical Impression Statement Pt reports doing more cooking but it is going slowly. Pt increasing with overall coordination with RUE.    OT Occupational Profile and History Detailed Assessment- Review of Records and additional review of physical, cognitive, psychosocial history related to current functional performance    Occupational performance deficits (Please refer to evaluation for details): IADL's;Leisure    Body Structure / Function / Physical Skills ADL;UE functional use;Strength;Coordination;FMC;IADL;ROM    Cognitive Skills Memory    Rehab Potential Good    Clinical Decision Making Several treatment options, min-mod task modification necessary    Comorbidities Affecting Occupational Performance: May have comorbidities impacting occupational performance    Modification or Assistance to Complete Evaluation  No modification of tasks or assist necessary to complete eval    OT Frequency 2x / week    OT Duration --   5 WEEKS (plus  eval)   OT Treatment/Interventions Self-care/ADL training;DME and/or AE instruction;Moist Heat;Therapeutic activities;Therapeutic exercise;Cognitive remediation/compensation;Coping strategies training;Functional Mobility Training;Passive range of motion;Neuromuscular education;Patient/family education;Manual Therapy;Visual/perceptual remediation/compensation;Paraffin;Fluidtherapy  Plan progress towards remaining goals, memory strategies, continue coordination    Consulted and Agree with Plan of Care Patient           Patient will benefit from skilled therapeutic intervention in order to improve the following deficits and impairments:   Body Structure / Function / Physical Skills: ADL,UE functional use,Strength,Coordination,FMC,IADL,ROM Cognitive Skills: Memory     Visit Diagnosis: Other lack of coordination  Stiffness of right shoulder, not elsewhere classified  Attention and concentration deficit  Unsteadiness on feet  Muscle weakness (generalized)    Problem List Patient Active Problem List   Diagnosis Date Noted  . Depression 07/05/2020  . CVA (cerebral vascular accident) ([place]) 06/21/2020  . CVA (cerebrovascular accident) ([place]) 06/19/2020  . Abnormality of gait 10/14/2018  . Dizziness and giddiness 09/13/2018  . Debility 06/26/2018  . Chronic nonintractable headache 04/26/2018  . Diastolic dysfunction   . Congestion of nasal sinus   . Pneumonia of left lower lobe due to infectious organism   . SOB (shortness of breath)   . Candidiasis   . CKD (chronic kidney disease) stage 4, GFR 15-29 ml/min (HCC)   . Acute on chronic anemia   . Hypoalbuminemia due to protein-calorie malnutrition ([place])   . Essential hypertension   . Non-insulin dependent type 2 diabetes mellitus ([place])   . PRES (posterior reversible encephalopathy syndrome)   . Hypokalemia   . History of CVA with residual deficit   . History of seizure 03/25/2018  . Bilateral carpal tunnel syndrome 02/14/2018   . Arthritis 10/31/2017  . Asthma 10/31/2017  . Constipation 10/31/2017  . Kidney stone 10/31/2017  . Anemia of chronic disease 08/09/2017  . Hyperlipidemia 07/26/2017  . 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[place]) 07/25/2017  . Anxiety as acute reaction to exceptional stress 04/23/2017  . Insomnia 10/08/2016  . Neuropathy 06/07/2016  . Gait instability   . Lesion of pons   . Slurred speech   . Dizziness 04/30/2016  . Hyperglycemia 04/30/2016  . Chronic kidney disease (CKD), stage III (moderate) ([place]) 04/30/2016  . Ataxia 04/30/2016  . Chronic right shoulder pain 04/03/2016  . 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 Rhinitis, allergic 08/25/2015  . Asthma, mild intermittent 07/14/2015  . Gastroesophageal reflux disease without esophagitis 06/08/2015  . Hypertensive cardiovascular disease 05/05/2015  . Vitamin D deficiency 05/05/2015    Zachery Conch MOT, OTR/L  08/02/2020, 1:50 PM  [facility] [address] [place], [place], [address] Phone: [phone]   Fax:  [phone]  Name: Christina Orozco MRN: [id number] Date of Birth: [birth year]

## 2020-08-09 ENCOUNTER — Ambulatory Visit: Payer: Medicaid Other | Admitting: Occupational Therapy

## 2020-08-09 ENCOUNTER — Ambulatory Visit: Payer: Medicaid Other | Admitting: Physical Therapy

## 2020-08-11 ENCOUNTER — Encounter: Payer: Self-pay | Admitting: Physical Therapy

## 2020-08-11 ENCOUNTER — Ambulatory Visit: Payer: Medicaid Other | Admitting: Occupational Therapy

## 2020-08-11 ENCOUNTER — Encounter: Payer: Self-pay | Admitting: Occupational Therapy

## 2020-08-11 ENCOUNTER — Other Ambulatory Visit: Payer: Self-pay

## 2020-08-11 ENCOUNTER — Ambulatory Visit: Payer: Medicaid Other | Attending: Internal Medicine | Admitting: Physical Therapy

## 2020-08-11 DIAGNOSIS — M6281 Muscle weakness (generalized): Secondary | ICD-10-CM | POA: Diagnosis present

## 2020-08-11 DIAGNOSIS — R29818 Other symptoms and signs involving the nervous system: Secondary | ICD-10-CM | POA: Insufficient documentation

## 2020-08-11 DIAGNOSIS — R41840 Attention and concentration deficit: Secondary | ICD-10-CM

## 2020-08-11 DIAGNOSIS — R2689 Other abnormalities of gait and mobility: Secondary | ICD-10-CM | POA: Diagnosis present

## 2020-08-11 DIAGNOSIS — R278 Other lack of coordination: Secondary | ICD-10-CM

## 2020-08-11 DIAGNOSIS — R2681 Unsteadiness on feet: Secondary | ICD-10-CM | POA: Insufficient documentation

## 2020-08-11 NOTE — Therapy (Signed)
[facility] [address] [place], [place], [address] Phone: [phone]   Fax:  [phone]  Occupational Therapy Treatment  Patient Details  Name: Christina Orozco MRN: [id number] Date of Birth: 1971/04/27 Referring Provider (OT): Dr. Francine Graven   Encounter Date: 08/11/2020   OT End of Session - 08/11/20 1023    Visit Number 4    Number of Visits 11    Date for OT Re-Evaluation 08/17/20    Authorization Type [place] [employer]    Authorization Time Period 10 OT visits Auth 07/19/2020-09/01/2020 VL:27 PT/OT/ST $3 copay    Authorization - Visit Number 3    Authorization - Number of Visits 10    OT Start Time 1020    OT Stop Time 1100    OT Time Calculation (min) 40 min           Past Medical History:  Diagnosis Date  . Anemia    severe  . Asthma   . CKD (chronic kidney disease)    stage IV  . DDD (degenerative disc disease), cervical   . Diabetes mellitus ([place])   . Dislocated shoulder    right  . GERD (gastroesophageal reflux disease)   . Hypertension   . Neuropathy   . Stroke [facility])     Past Surgical History:  Procedure Laterality Date  . ADENOIDECTOMY    . APPENDECTOMY    . CERVICAL SPINE SURGERY  06/2012   C5-C6  . EYE SURGERY     left eye surgery   . FOOT SURGERY    . LOOP RECORDER INSERTION N/A 08/27/2017   Procedure: LOOP RECORDER INSERTION;  Surgeon: Sanda Klein, MD;  Location: [facility];  Service: Cardiovascular;  Laterality: N/A;  . SHOULDER SURGERY    . TEE WITHOUT CARDIOVERSION N/A 08/27/2017   Procedure: TRANSESOPHAGEAL ECHOCARDIOGRAM (TEE);  Surgeon: Sanda Klein, MD;  Location: [facility] ENDOSCOPY;  Service: Cardiovascular;  Laterality: N/A;  . TONSILLECTOMY      There were no vitals filed for this visit.   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 PMH: Rt thalamus stroke 2017, h/o lacunar strokes (01/2020, 2019), DM2, CKD  stage 4, HTN, GERD, neuropathy (loss of vision Lt eye from previous stroke)    Limitations no driving, fall risk    Currently in Pain? No/denies                    Treatment: [place]urdue pegboard for increased fine motor coordination and in hand manipulation, only occasional min difficulty, min v.c Handwriting activities: Distal finger control worksheet with coban wrapped pen, min difficulty/ v.c Then pt wrote 4 sentences with grossly 90-95% legibility, pt reports hand fatigue with this. Seated and standing to place and remove graded clothespins 1-8# from antenna to increase RUE activity tolerance O'cconnor pegbaord with tweezers to place/ remove pegs for sustained pinch and increased dexterity, min difficulty            OT Education - 08/11/20 1029    Education Details Discussed memory stategies, pt reports using calendar and reminders on her phone    Person(s) Educated Patient    Methods Explanation;Demonstration;Verbal cues    Comprehension Verbalized understanding               OT Long Term Goals - 08/02/20 1345      OT LONG TERM GOAL #1   Title Independent with HEP for Rt hand coordination (including  writing) and shoulder    Baseline dependent    Time 5    Period Weeks    Status Achieved      OT LONG TERM GOAL #2   Title Improve coordination Rt hand as evidenced by reducing speed on 9 hole peg test to 35 sec or less    Baseline 40.72 sec    Time 5    Period Weeks    Status On-going      OT LONG TERM GOAL #3   Title Pt to write 3 sentences in cursive maintaining 100% legibility    Baseline currently only writing in print    Time 5    Period Weeks    Status On-going   90%     OT LONG TERM GOAL #4   Title Pt to verbalize understanding with memory compensatory strategies and possible A/E to making cooking easier/safer for peeling and cutting    Baseline dependent    Time 5    Period Weeks    Status On-going   went over memory strategies 08/02/2020      OT LONG TERM GOAL #5   Title Pt to consistently perform cooking tasks at mod I level    Baseline only microwaveable and cold prep at this time    Time 5    Period Weeks    Status On-going   pt completing some cooking at home and reporting its taking longer but it is getting easier                Plan - 08/11/20 1025    Clinical Impression Statement Pt is progressing towards goals with improving fine motor coordination. Pt reports she had nausea and vomiting for several days. She sees MD tomorrow and Friday.    OT Occupational Profile and History Detailed Assessment- Review of Records and additional review of physical, cognitive, psychosocial history related to current functional performance    Occupational performance deficits (Please refer to evaluation for details): IADL's;Leisure    Body Structure / Function / Physical Skills ADL;UE functional use;Strength;Coordination;FMC;IADL;ROM    Cognitive Skills Memory    Rehab Potential Good    Clinical Decision Making Several treatment options, min-mod task modification necessary    Comorbidities Affecting Occupational Performance: May have comorbidities impacting occupational performance    Modification or Assistance to Complete Evaluation  No modification of tasks or assist necessary to complete eval    OT Frequency 2x / week    OT Duration --   5 WEEKS (plus eval)   OT Treatment/Interventions Self-care/ADL training;DME and/or AE instruction;Moist Heat;Therapeutic activities;Therapeutic exercise;Cognitive remediation/compensation;Coping strategies training;Functional Mobility Training;Passive range of motion;Neuromuscular education;Patient/family education;Manual Therapy;Visual/perceptual remediation/compensation;Paraffin;Fluidtherapy    Plan simple cooking task, fine motor coordination    Consulted and Agree with Plan of Care Patient           Patient will benefit from skilled therapeutic intervention in order to improve the  following deficits and impairments:   Body Structure / Function / Physical Skills: ADL,UE functional use,Strength,Coordination,FMC,IADL,ROM Cognitive Skills: Memory     Visit Diagnosis: Other lack of coordination  Attention and concentration deficit  Muscle weakness (generalized)  Other symptoms and signs involving the nervous system    Problem List Patient Active Problem List   Diagnosis Date Noted  . Depression 07/05/2020  . CVA (cerebral vascular accident) ([place]) 06/21/2020  . CVA (cerebrovascular accident) ([place]) 06/19/2020  . Abnormality of gait 10/14/2018  . Dizziness and giddiness 09/13/2018  . Debility 06/26/2018  . Chronic  nonintractable headache 04/26/2018  . Diastolic dysfunction   . Congestion of nasal sinus   . Pneumonia of left lower lobe due to infectious organism   . SOB (shortness of breath)   . Candidiasis   . CKD (chronic kidney disease) stage 4, GFR 15-29 ml/min (HCC)   . Acute on chronic anemia   . Hypoalbuminemia due to protein-calorie malnutrition ([place])   . Essential hypertension   . Non-insulin dependent type 2 diabetes mellitus ([place])   . PRES (posterior reversible encephalopathy syndrome)   . Hypokalemia   . History of CVA with residual deficit   . History of seizure 03/25/2018  . Bilateral carpal tunnel syndrome 02/14/2018  . Arthritis 10/31/2017  . Asthma 10/31/2017  . Constipation 10/31/2017  . Kidney stone 10/31/2017  . Anemia of chronic disease 08/09/2017  . Hyperlipidemia 07/26/2017  . 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[place]) 07/25/2017  . Anxiety as acute reaction to exceptional stress 04/23/2017  . Insomnia 10/08/2016  . Neuropathy 06/07/2016  . Gait instability   . Lesion of pons   . Slurred speech   . Dizziness 04/30/2016  . Hyperglycemia 04/30/2016  . Chronic kidney disease (CKD), stage III (moderate) ([place]) 04/30/2016  . Ataxia 04/30/2016  . Chronic right shoulder pain 04/03/2016  . 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 Rhinitis,  allergic 08/25/2015  . Asthma, mild intermittent 07/14/2015  . Gastroesophageal reflux disease without esophagitis 06/08/2015  . Hypertensive cardiovascular disease 05/05/2015  . Vitamin D deficiency 05/05/2015    RINE,KATHRYN 08/11/2020, 10:35 AM Theone Murdoch, OTR/L [phone] Phone: [phone] 10:59 AM 08/11/20 [facility] [address] [place], [place], [address] Phone: [phone]   Fax:  [phone]  Name: Christina Orozco MRN: [id number] Date of Birth: August 24, 1970

## 2020-08-12 NOTE — Therapy (Signed)
[facility] [address] [place], [place], [address] Phone: [phone]   Fax:  [phone]  Physical Therapy Treatment  Patient Details  Name: Christina Orozco MRN: [id number] Date of Birth: Feb 03, 1971 Referring Provider (PT): Dr. Francine Graven   Encounter Date: 08/11/2020     08/11/20 1104  PT Visits / Re-Eval  Visit Number 4  Number of Visits [place] Medicaid - [place] for 7 PT visits 07/19/20 - 08/30/20  Authorization - Visit Number 3  Authorization - Number of Visits 7  PT Time Calculation  PT Start Time 1102  PT Stop Time 1145  PT Time Calculation (min) 43 min  PT - End of Session  Equipment Utilized During Treatment Gait belt  Activity Tolerance Patient tolerated treatment well;Patient limited by fatigue  Behavior During Therapy [facility] for tasks assessed/performed    Past Medical History:  Diagnosis Date  . Anemia    severe  . Asthma   . CKD (chronic kidney disease)    stage IV  . DDD (degenerative disc disease), cervical   . Diabetes mellitus ([place])   . Dislocated shoulder    right  . GERD (gastroesophageal reflux disease)   . Hypertension   . Neuropathy   . Stroke [facility])     Past Surgical History:  Procedure Laterality Date  . ADENOIDECTOMY    . APPENDECTOMY    . CERVICAL SPINE SURGERY  06/2012   C5-C6  . EYE SURGERY     left eye surgery   . FOOT SURGERY    . LOOP RECORDER INSERTION N/A 08/27/2017   Procedure: LOOP RECORDER INSERTION;  Surgeon: Sanda Klein, MD;  Location: [facility];  Service: Cardiovascular;  Laterality: N/A;  . SHOULDER SURGERY    . TEE WITHOUT CARDIOVERSION N/A 08/27/2017   Procedure: TRANSESOPHAGEAL ECHOCARDIOGRAM (TEE);  Surgeon: Sanda Klein, MD;  Location: [facility] ENDOSCOPY;  Service: Cardiovascular;  Laterality: N/A;  . TONSILLECTOMY      There were no vitals filed for this visit.     08/11/20 1102  Symptoms/Limitations   Subjective Reports a fall on 'Sunday. Missed the toilet while trying to sit down. Was able to get herself up and reports not injured. Was sick on her stomach on Monday, why she did not come that day. Feeling a little better today. Was able to get her HEP done some, feels they are getting easy,  Pertinent History DM, seizures 2019, HTN, GERD, CKD IV, asthma, neuropathy, anemia, CVA R thalamus 2017  Limitations Walking  Patient Stated Goals wants to be more stable when she walks, improve balance  Pain Assessment  Currently in Pain? No/denies  Pain Score 0      03' /02/22 1109  Transfers  Transfers Stand to Sit;Sit to Stand  Sit to Stand 5: Supervision;With upper extremity assist;From chair/3-in-1  Stand to Sit 5: Supervision;With upper extremity assist;To chair/3-in-1  Ambulation/Gait  Ambulation/Gait Yes  Ambulation/Gait Assistance 5: Supervision  Ambulation/Gait Assistance Details occasional cues on posture and increased step length with gaitl no balance issues noted with use of RW.  Ambulation Distance (Feet) 115 Feet (x1 with RW., plus around gym with session  Assistive device Rolling walker  Gait Pattern Step-through pattern;Decreased stance time - right;Decreased dorsiflexion - right;Decreased weight shift to right;Right foot flat  Ambulation Surface Level;Indoor  Gait velocity 14.75 sec's= 2.22 ft/sec with RW  Neuro Re-ed   Neuro Re-ed Details  for balance/muscle re-ed: gait with HHA only around track working  on scanning all directions randomly, then 2 laps around gym with weaving around cones, over red and blue mats in separate locations and stepping over bolsters separated as well on track with min guard to min assist for balance.       08/11/20 1122  Balance Exercises: Standing  Standing Eyes Opened Wide (BOA);Foam/compliant surface;Other reps (comment);Limitations  Standing Eyes Opened Limitations on blue airex with light to no UE support: heel<>toe raises, alternating slow  marching, mini squats for ~10 reps each with min guard assist to min assist with no UE support. cues on form and technique.  Standing Eyes Closed Wide (BOA);Foam/compliant surface;Other reps (comment);30 secs;Limitations;Head turns  Standing Eyes Closed Limitations on airex with no UE support, feet apart: EC 30 sec's x 3 reps, progressing to EC head movements left<>right, up<>down for ~8 reps each. min guard to min assist for balance with posterior bias noted. cues on posture and weight shifting needed for balance.       PT Short Term Goals - 08/11/20 1104      PT SHORT TERM GOAL #1   Title Pt will be independent with initial HEP in order to build upon functional gains made in therapy. ALL STGS DUE 08/02/20    Baseline 08/11/20: met with current program, will benefit from advancement as pt progressess    Status Achieved    Target Date 08/02/20      PT SHORT TERM GOAL #2   Title Pt will undergo BERG with LTG written as appropriate.    Baseline 46/56 on 08/02/20    Status Achieved      PT SHORT TERM GOAL #3   Title Pt will ambulate at least 83' with LRAD with supervision over level indoor surfaces in order to demo improved household mobility.    Baseline 08/11/20: met in session today    Time --    Period --    Status Achieved      PT SHORT TERM GOAL #4   Title Pt will improve gait speed to at least 1.9 ft/sec with RW vs. LRAD in order to demo decr fall risk.    Baseline 08/11/20: 2.22 ft/sec with RW    Time --    Period --    Status Achieved                 PT Long Term Goals - 08/02/20 1321      PT LONG TERM GOAL #1   Title Pt will be independent with final HEP in order to build upon functional gains made in therapy. ALL LTGS DUE 08/30/20    Baseline currently dependent.    Time 7    Period Weeks    Status New      PT LONG TERM GOAL #2   Title Pt will improve BERG to at least a 50/56 in order to demo decr fall risk.    Baseline 46/56 on 08/02/20    Time 7    Period Weeks     Status Revised      PT LONG TERM GOAL #3   Title Pt will improve gait speed to at least 2.3 ft/sec with RW vs. LRAD in order to demo decr fall risk.    Baseline 1.53 ft/sec    Time 7    Period Weeks    Status New      PT LONG TERM GOAL #4   Title Pt will decr 5x sit <> stand time to 19 seconds or less to demo  improved BLE strength.    Baseline 26.16 seconds    Time 7    Period Weeks    Status New      PT LONG TERM GOAL #5   Title Pt will decr TUG to 20 seconds or less with LRAD in order to demo decr fall risk,    Baseline 26.84 seconds    Time 7    Period Weeks    Status New            08/11/20 1104  Plan  Clinical Impression Statement Today's skilled session focused on progress toward STGs initially with all goals met. Remainder of session focused on gait with decreased UE support, gait with various surfaces/over obstacles and balance training on compliant surface. No issues noted or reported in session. The pt is progressing toward goals and should benefit from continued PT to progress toward unmet goals.  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 stroke)  Examination-Activity Limitations Stand;Transfers;Locomotion Level;Stairs  Examination-Participation Restrictions Cleaning;Community Activity;Laundry (cooking)  Pt will benefit from skilled therapeutic intervention in order to improve on the following deficits Abnormal gait;Decreased activity tolerance;Decreased balance;Decreased endurance;Decreased coordination;Decreased strength;Difficulty walking  Stability/Clinical Decision Making Stable/Uncomplicated  Rehab Potential Good  PT Frequency 2x / week (plus 1x week for 4 weeks)  PT Duration 3 weeks (plus  1x week for 4 weeks)  PT Treatment/Interventions ADLs/Self Care Home Management;DME Instruction;Gait training;Stair training;Functional mobility training;Therapeutic activities;Patient/family education;Therapeutic exercise;Balance training;Neuromuscular re-education;Passive range of motion;Orthotic Fit/Training;Vestibular  PT Next Visit Plan advance HEP as indicated. SciFit/NuStep. Continue to work on strengthening and balance with decreased UE support. continue to work on gait with no AD/HHA.  PT Home Exercise Plan Access Code: [email or web address]  QMVHQIONG and Agree with Plan of Care Patient        Patient will benefit from skilled therapeutic intervention in order to improve the following deficits and impairments:  Abnormal gait,Decreased activity tolerance,Decreased balance,Decreased endurance,Decreased coordination,Decreased strength,Difficulty walking  Visit Diagnosis: Unsteadiness on feet  Muscle weakness (generalized)  Other abnormalities of gait and mobility     Problem List Patient Active Problem List   Diagnosis Date Noted  . Depression 07/05/2020  . CVA (cerebral vascular accident) ([place]) 06/21/2020  . CVA (cerebrovascular accident) ([place]) 06/19/2020  . Abnormality of gait 10/14/2018  . Dizziness and giddiness 09/13/2018  . Debility 06/26/2018  . Chronic nonintractable headache 04/26/2018  . Diastolic dysfunction   . Congestion of nasal sinus   . Pneumonia of left lower lobe due to infectious organism   . SOB (shortness of breath)   . Candidiasis   . CKD (chronic kidney disease) stage 4, GFR 15-29 ml/min (HCC)   . Acute on chronic anemia   . Hypoalbuminemia due to protein-calorie malnutrition ([place])   . Essential hypertension   . Non-insulin dependent type 2 diabetes mellitus ([place])   . PRES (posterior reversible encephalopathy syndrome)   . Hypokalemia   . History of CVA with residual deficit   . History of seizure 03/25/2018  . Bilateral carpal tunnel syndrome  02/14/2018  . Arthritis 10/31/2017  . Asthma 10/31/2017  . Constipation 10/31/2017  . Kidney stone 10/31/2017  . Anemia of chronic disease 08/09/2017  . Hyperlipidemia 07/26/2017  . 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[place]) 07/25/2017  . Anxiety as acute reaction to exceptional stress 04/23/2017  . Insomnia  10/08/2016  . Neuropathy 06/07/2016  . Gait instability   . Lesion of pons   . Slurred speech   . Dizziness 04/30/2016  . Hyperglycemia 04/30/2016  . Chronic kidney disease (CKD), stage III (moderate) ([place]) 04/30/2016  . Ataxia 04/30/2016  . Chronic right shoulder pain 04/03/2016  . 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 Rhinitis, allergic 08/25/2015  . Asthma, mild intermittent 07/14/2015  . Gastroesophageal reflux disease without esophagitis 06/08/2015  . Hypertensive cardiovascular disease 05/05/2015  . Vitamin D deficiency 05/05/2015    Willow Ora, PTA, [facility] Outpatient Neuro [facility] [address], [place], [place] [address] [phone] 08/12/20, 8:53 PM   Name: Greidy Sherard MRN: [id number] Date of Birth: 11-05-1970

## 2020-08-16 ENCOUNTER — Ambulatory Visit: Payer: Medicaid Other | Admitting: Physical Therapy

## 2020-08-16 ENCOUNTER — Ambulatory Visit: Payer: Medicaid Other | Admitting: Occupational Therapy

## 2020-08-18 ENCOUNTER — Ambulatory Visit: Payer: Medicaid Other | Admitting: Occupational Therapy

## 2020-08-18 ENCOUNTER — Other Ambulatory Visit: Payer: Self-pay

## 2020-08-18 DIAGNOSIS — R2681 Unsteadiness on feet: Secondary | ICD-10-CM | POA: Diagnosis not present

## 2020-08-18 DIAGNOSIS — R41840 Attention and concentration deficit: Secondary | ICD-10-CM

## 2020-08-18 DIAGNOSIS — R278 Other lack of coordination: Secondary | ICD-10-CM

## 2020-08-18 DIAGNOSIS — R29818 Other symptoms and signs involving the nervous system: Secondary | ICD-10-CM

## 2020-08-18 DIAGNOSIS — M6281 Muscle weakness (generalized): Secondary | ICD-10-CM

## 2020-08-18 NOTE — Therapy (Signed)
[facility] [address] [place], [place], [address] Phone: [phone]   Fax:  [phone]  Occupational Therapy Treatment  Patient Details  Name: Christina Orozco MRN: [id number] Date of Birth: 1970/08/01 Referring Provider (OT): Dr. Francine Graven   Encounter Date: 08/18/2020   OT End of Session - 08/18/20 1004    Visit Number 5    Number of Visits 11    Date for OT Re-Evaluation 08/17/20    Authorization Type [place] [employer]    Authorization Time Period 10 OT visits Auth 07/19/2020-09/01/2020 VL:27 PT/OT/ST $3 copay    Authorization - Visit Number 4    Authorization - Number of Visits 10    OT Start Time 0934    OT Stop Time 1015    OT Time Calculation (min) 41 min           Past Medical History:  Diagnosis Date  . Anemia    severe  . Asthma   . CKD (chronic kidney disease)    stage IV  . DDD (degenerative disc disease), cervical   . Diabetes mellitus ([place])   . Dislocated shoulder    right  . GERD (gastroesophageal reflux disease)   . Hypertension   . Neuropathy   . Stroke [facility])     Past Surgical History:  Procedure Laterality Date  . ADENOIDECTOMY    . APPENDECTOMY    . CERVICAL SPINE SURGERY  06/2012   C5-C6  . EYE SURGERY     left eye surgery   . FOOT SURGERY    . LOOP RECORDER INSERTION N/A 08/27/2017   Procedure: LOOP RECORDER INSERTION;  Surgeon: Sanda Klein, MD;  Location: [facility] CV LAB;  Service: Cardiovascular;  Laterality: N/A;  . SHOULDER SURGERY    . TEE WITHOUT CARDIOVERSION N/A 08/27/2017   Procedure: TRANSESOPHAGEAL ECHOCARDIOGRAM (TEE);  Surgeon: Sanda Klein, MD;  Location: [facility] ENDOSCOPY;  Service: Cardiovascular;  Laterality: N/A;  . TONSILLECTOMY      There were no vitals filed for this visit.            Treatment: simple cooking task to locate items and scramble an egg. Pt located all items and performed task safety and independently. Pt turned off  stove without prompts. Pt washed pan modified I.  Gripper set at level 3 to pick up 1 inch blocks for sustained grip, min difficulty/ drops. Pt wrote a short paragraph with 100% legibility and good letter size.                  OT Long Term Goals - 08/18/20 0956      OT LONG TERM GOAL #1   Title Independent with HEP for Rt hand coordination (including writing) and shoulder    Baseline dependent    Time 5    Period Weeks    Status Achieved      OT LONG TERM GOAL #2   Title Improve coordination Rt hand as evidenced by reducing speed on 9 hole peg test to 35 sec or less    Baseline 40.72 sec    Time 5    Period Weeks    Status On-going      OT LONG TERM GOAL #3   Title Pt to write 3 sentences in cursive maintaining 100% legibility    Baseline currently only writing in print    Time 5    Period Weeks    Status On-going   90%  OT LONG TERM GOAL #4   Title Pt to verbalize understanding with memory compensatory strategies and possible A/E to making cooking easier/safer for peeling and cutting    Baseline dependent    Time 5    Period Weeks    Status Achieved   went over memory strategies 08/02/2020, pt does not need A/E for peeling cutting, she reports performing at home without difficulty.     OT LONG TERM GOAL #5   Title Pt to consistently perform cooking tasks at mod I level    Baseline only microwaveable and cold prep at this time    Time 5    Period Weeks    Status Achieved   Pt performed mod I in clinic, and she cooked a meal at home                Plan - 08/18/20 0959    Clinical Impression Statement Pt is progressing towards goals. She was able to perform simple cooking task safely at modified I level today.    OT Occupational Profile and History Detailed Assessment- Review of Records and additional review of physical, cognitive, psychosocial history related to current functional performance    Occupational performance deficits (Please refer to  evaluation for details): IADL's;Leisure    Body Structure / Function / Physical Skills ADL;UE functional use;Strength;Coordination;FMC;IADL;ROM    Cognitive Skills Memory    Rehab Potential Good    Clinical Decision Making Several treatment options, min-mod task modification necessary    Comorbidities Affecting Occupational Performance: May have comorbidities impacting occupational performance    Modification or Assistance to Complete Evaluation  No modification of tasks or assist necessary to complete eval    OT Frequency 2x / week    OT Duration --   5 WEEKS (plus eval)   OT Treatment/Interventions Self-care/ADL training;DME and/or AE instruction;Moist Heat;Therapeutic activities;Therapeutic exercise;Cognitive remediation/compensation;Coping strategies training;Functional Mobility Training;Passive range of motion;Neuromuscular education;Patient/family education;Manual Therapy;Visual/perceptual remediation/compensation;Paraffin;Fluidtherapy    Plan work towards unmet goals, update/ review shoulder HEP, pt may be able to d/c next week dependent upon progress.    Consulted and Agree with Plan of Care Patient           Patient will benefit from skilled therapeutic intervention in order to improve the following deficits and impairments:   Body Structure / Function / Physical Skills: ADL,UE functional use,Strength,Coordination,FMC,IADL,ROM Cognitive Skills: Memory     Visit Diagnosis: Other lack of coordination  Attention and concentration deficit  Muscle weakness (generalized)  Other symptoms and signs involving the nervous system  Unsteadiness on feet    Problem List Patient Active Problem List   Diagnosis Date Noted  . Depression 07/05/2020  . CVA (cerebral vascular accident) ([place]) 06/21/2020  . CVA (cerebrovascular accident) ([place]) 06/19/2020  . Abnormality of gait 10/14/2018  . Dizziness and giddiness 09/13/2018  . Debility 06/26/2018  . Chronic nonintractable headache  04/26/2018  . Diastolic dysfunction   . Congestion of nasal sinus   . Pneumonia of left lower lobe due to infectious organism   . SOB (shortness of breath)   . Candidiasis   . CKD (chronic kidney disease) stage 4, GFR 15-29 ml/min (HCC)   . Acute on chronic anemia   . Hypoalbuminemia due to protein-calorie malnutrition ([place])   . Essential hypertension   . Non-insulin dependent type 2 diabetes mellitus ([place])   . PRES (posterior reversible encephalopathy syndrome)   . Hypokalemia   . History of CVA with residual deficit   . History of seizure  03/25/2018  . Bilateral carpal tunnel syndrome 02/14/2018  . Arthritis 10/31/2017  . Asthma 10/31/2017  . Constipation 10/31/2017  . Kidney stone 10/31/2017  . Anemia of chronic disease 08/09/2017  . Hyperlipidemia 07/26/2017  . 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[place]) 07/25/2017  . Anxiety as acute reaction to exceptional stress 04/23/2017  . Insomnia 10/08/2016  . Neuropathy 06/07/2016  . Gait instability   . Lesion of pons   . Slurred speech   . Dizziness 04/30/2016  . Hyperglycemia 04/30/2016  . Chronic kidney disease (CKD), stage III (moderate) ([place]) 04/30/2016  . Ataxia 04/30/2016  . Chronic right shoulder pain 04/03/2016  . 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 Rhinitis, allergic 08/25/2015  . Asthma, mild intermittent 07/14/2015  . Gastroesophageal reflux disease without esophagitis 06/08/2015  . Hypertensive cardiovascular disease 05/05/2015  . Vitamin D deficiency 05/05/2015    Christina Orozco 08/18/2020, 10:05 AM Theone Murdoch, OTR/L [phone] Phone: [phone] 10:09 AM 08/18/20 [facility] [address] [place], [place], [address] Phone: [phone]   Fax:  [phone]  Name: Christina Orozco MRN: [id number] Date of Birth: 1970/09/04

## 2020-08-23 ENCOUNTER — Ambulatory Visit: Payer: Medicaid Other | Admitting: Occupational Therapy

## 2020-08-23 ENCOUNTER — Other Ambulatory Visit: Payer: Self-pay

## 2020-08-23 DIAGNOSIS — R41840 Attention and concentration deficit: Secondary | ICD-10-CM

## 2020-08-23 DIAGNOSIS — R2681 Unsteadiness on feet: Secondary | ICD-10-CM | POA: Diagnosis not present

## 2020-08-23 DIAGNOSIS — R278 Other lack of coordination: Secondary | ICD-10-CM

## 2020-08-23 NOTE — Therapy (Signed)
[facility] [address] [place], [place], [address] Phone: [phone]   Fax:  [phone]  Occupational Therapy Treatment  Patient Details  Name: Christina Orozco MRN: [id number] Date of Birth: Apr 20, 1971 Referring Provider (OT): Dr. Francine Graven   Encounter Date: 08/23/2020   OT End of Session - 08/23/20 1121    Visit Number 6    Number of Visits 11    Date for OT Re-Evaluation 08/17/20    Authorization Type [place] [employer]    Authorization Time Period 10 OT visits Auth 07/19/2020-09/01/2020 VL:27 PT/OT/ST $3 copay    Authorization - Visit Number 5    Authorization - Number of Visits 10    OT Start Time 0935    OT Stop Time 1015    OT Time Calculation (min) 40 min           Past Medical History:  Diagnosis Date  . Anemia    severe  . Asthma   . CKD (chronic kidney disease)    stage IV  . DDD (degenerative disc disease), cervical   . Diabetes mellitus ([place])   . Dislocated shoulder    right  . GERD (gastroesophageal reflux disease)   . Hypertension   . Neuropathy   . Stroke [facility])     Past Surgical History:  Procedure Laterality Date  . ADENOIDECTOMY    . APPENDECTOMY    . CERVICAL SPINE SURGERY  06/2012   C5-C6  . EYE SURGERY     left eye surgery   . FOOT SURGERY    . LOOP RECORDER INSERTION N/A 08/27/2017   Procedure: LOOP RECORDER INSERTION;  Surgeon: Sanda Klein, MD;  Location: [facility];  Service: Cardiovascular;  Laterality: N/A;  . SHOULDER SURGERY    . TEE WITHOUT CARDIOVERSION N/A 08/27/2017   Procedure: TRANSESOPHAGEAL ECHOCARDIOGRAM (TEE);  Surgeon: Sanda Klein, MD;  Location: [facility] ENDOSCOPY;  Service: Cardiovascular;  Laterality: N/A;  . TONSILLECTOMY      There were no vitals filed for this visit.   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 PMH: Rt  thalamus stroke 2017, h/o lacunar strokes (01/2020, 2019), DM2, CKD stage 4, HTN, GERD, neuropathy (loss of vision Lt eye from previous stroke)    Limitations no driving, fall risk    Currently in Pain? No/denies                        OT Treatments/Exercises (OP) - 08/23/20 0001      Fine Motor Coordination (Hand/Wrist)   Handwriting Practiced writing 3 sentences in print and cursive w/ 100% legibility. Pt instructed to stretch hand after approx 3 sentences d/t pain    Grooved pegs Pt placing grooved pegs in pegboard little difficulty then removing with tweezers w/ no difficulty                       OT Long Term Goals - 08/23/20 1002      OT LONG TERM GOAL #1   Title Independent with HEP for Rt hand coordination (including writing) and shoulder    Baseline dependent    Time 5    Period Weeks    Status Achieved      OT LONG TERM GOAL #2   Title Improve coordination Rt hand as evidenced by  reducing speed on 9 hole peg test to 35 sec or less    Baseline 40.72 sec    Time 5    Period Weeks    Status Achieved   33 sec     OT LONG TERM GOAL #3   Title Pt to write 3 sentences in cursive maintaining 100% legibility    Baseline currently only writing in print    Time 5    Period Weeks    Status Achieved      OT LONG TERM GOAL #4   Title Pt to verbalize understanding with memory compensatory strategies and possible A/E to making cooking easier/safer for peeling and cutting    Baseline dependent    Time 5    Period Weeks    Status Achieved   went over memory strategies 08/02/2020, pt does not need A/E for peeling cutting, she reports performing at home without difficulty.     OT LONG TERM GOAL #5   Title Pt to consistently perform cooking tasks at mod I level    Baseline only microwaveable and cold prep at this time    Time 5    Period Weeks    Status Achieved   Pt performed mod I in clinic, and she cooked a meal at home                Plan  - 08/23/20 1003    Clinical Impression Statement Pt has met all LTG's at this time. Pt still has pain w/ writing only (partly d/t CTS)    OT Occupational Profile and History Detailed Assessment- Review of Records and additional review of physical, cognitive, psychosocial history related to current functional performance    Occupational performance deficits (Please refer to evaluation for details): IADL's;Leisure    Body Structure / Function / Physical Skills ADL;UE functional use;Strength;Coordination;FMC;IADL;ROM    Cognitive Skills Memory    Rehab Potential Good    Clinical Decision Making Several treatment options, min-mod task modification necessary    Comorbidities Affecting Occupational Performance: May have comorbidities impacting occupational performance    Modification or Assistance to Complete Evaluation  No modification of tasks or assist necessary to complete eval    OT Frequency 2x / week    OT Duration --   5 WEEKS (plus eval)   OT Treatment/Interventions Self-care/ADL training;DME and/or AE instruction;Moist Heat;Therapeutic activities;Therapeutic exercise;Cognitive remediation/compensation;Coping strategies training;Functional Mobility Training;Passive range of motion;Neuromuscular education;Patient/family education;Manual Therapy;Visual/perceptual remediation/compensation;Paraffin;Fluidtherapy    Plan Continue with [facility] and writing - d/c next session    Consulted and Agree with Plan of Care Patient           Patient will benefit from skilled therapeutic intervention in order to improve the following deficits and impairments:   Body Structure / Function / Physical Skills: ADL,UE functional use,Strength,Coordination,FMC,IADL,ROM Cognitive Skills: Memory     Visit Diagnosis: Other lack of coordination  Attention and concentration deficit    Problem List Patient Active Problem List   Diagnosis Date Noted  . Depression 07/05/2020  . CVA (cerebral vascular accident)  ([place]) 06/21/2020  . CVA (cerebrovascular accident) ([place]) 06/19/2020  . Abnormality of gait 10/14/2018  . Dizziness and giddiness 09/13/2018  . Debility 06/26/2018  . Chronic nonintractable headache 04/26/2018  . Diastolic dysfunction   . Congestion of nasal sinus   . Pneumonia of left lower lobe due to infectious organism   . SOB (shortness of breath)   . Candidiasis   . CKD (chronic kidney disease) stage 4, GFR 15-29  ml/min ([place])   . Acute on chronic anemia   . Hypoalbuminemia due to protein-calorie malnutrition ([place])   . Essential hypertension   . Non-insulin dependent type 2 diabetes mellitus ([place])   . PRES (posterior reversible encephalopathy syndrome)   . Hypokalemia   . History of CVA with residual deficit   . History of seizure 03/25/2018  . Bilateral carpal tunnel syndrome 02/14/2018  . Arthritis 10/31/2017  . Asthma 10/31/2017  . Constipation 10/31/2017  . Kidney stone 10/31/2017  . Anemia of chronic disease 08/09/2017  . Hyperlipidemia 07/26/2017  . 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[place]) 07/25/2017  . Anxiety as acute reaction to exceptional stress 04/23/2017  . Insomnia 10/08/2016  . Neuropathy 06/07/2016  . Gait instability   . Lesion of pons   . Slurred speech   . Dizziness 04/30/2016  . Hyperglycemia 04/30/2016  . Chronic kidney disease (CKD), stage III (moderate) ([place]) 04/30/2016  . Ataxia 04/30/2016  . Chronic right shoulder pain 04/03/2016  . 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 Rhinitis, allergic 08/25/2015  . Asthma, mild intermittent 07/14/2015  . Gastroesophageal reflux disease without esophagitis 06/08/2015  . Hypertensive cardiovascular disease 05/05/2015  . Vitamin D deficiency 05/05/2015    Carey Bullocks, OTR/L 08/23/2020, 11:23 AM  [facility] [address] [place], [place], [address] Phone: [phone]   Fax:  [phone]  Name: Markelle Asaro MRN: [id number] Date of  Birth: 1970/09/19

## 2020-08-25 ENCOUNTER — Ambulatory Visit: Payer: Medicaid Other

## 2020-08-25 ENCOUNTER — Ambulatory Visit: Payer: Medicaid Other | Admitting: Occupational Therapy

## 2020-08-25 ENCOUNTER — Other Ambulatory Visit: Payer: Self-pay

## 2020-08-25 DIAGNOSIS — R2689 Other abnormalities of gait and mobility: Secondary | ICD-10-CM

## 2020-08-25 DIAGNOSIS — R2681 Unsteadiness on feet: Secondary | ICD-10-CM | POA: Diagnosis not present

## 2020-08-25 DIAGNOSIS — M6281 Muscle weakness (generalized): Secondary | ICD-10-CM

## 2020-08-25 DIAGNOSIS — R41840 Attention and concentration deficit: Secondary | ICD-10-CM

## 2020-08-25 DIAGNOSIS — R278 Other lack of coordination: Secondary | ICD-10-CM

## 2020-08-25 DIAGNOSIS — R29818 Other symptoms and signs involving the nervous system: Secondary | ICD-10-CM

## 2020-08-25 NOTE — Therapy (Signed)
[facility] [address] [place], [place], [address] Phone: [phone]   Fax:  [phone]  Occupational Therapy Treatment  Patient Details  Name: Christina Orozco MRN: [id number] Date of Birth: Feb 27, 1971 Referring Provider (OT): Dr. Knox Royalty   Encounter Date: 08/25/2020   OT End of Session - 08/25/20 1033    Visit Number 7    Number of Visits 11    Date for OT Re-Evaluation 08/17/20    Authorization Type [place] [employer]    Authorization Time Period 10 OT visits Auth 07/19/2020-09/01/2020 VL:27 PT/OT/ST $3 copay    Authorization - Visit Number 6    Authorization - Number of Visits 10    OT Start Time 1015    OT Stop Time 1100    OT Time Calculation (min) 45 min    Activity Tolerance Patient tolerated treatment well    Behavior During Therapy [facility] for tasks assessed/performed           Past Medical History:  Diagnosis Date  . Anemia    severe  . Asthma   . CKD (chronic kidney disease)    stage IV  . DDD (degenerative disc disease), cervical   . Diabetes mellitus ([facility])   . Dislocated shoulder    right  . GERD (gastroesophageal reflux disease)   . Hypertension   . Neuropathy   . Stroke [facility])     Past Surgical History:  Procedure Laterality Date  . ADENOIDECTOMY    . APPENDECTOMY    . CERVICAL SPINE SURGERY  06/2012   C5-C6  . EYE SURGERY     left eye surgery   . FOOT SURGERY    . LOOP RECORDER INSERTION N/A 08/27/2017   Procedure: LOOP RECORDER INSERTION;  Surgeon: Thurmon Fair, MD;  Location: [facility];  Service: Cardiovascular;  Laterality: N/A;  . SHOULDER SURGERY    . TEE WITHOUT CARDIOVERSION N/A 08/27/2017   Procedure: TRANSESOPHAGEAL ECHOCARDIOGRAM (TEE);  Surgeon: Thurmon Fair, MD;  Location: [facility] ENDOSCOPY;  Service: Cardiovascular;  Laterality: N/A;  . TONSILLECTOMY      There were no vitals filed for this visit.   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 No/denies                        OT Treatments/Exercises (OP) - 08/25/20 0001      Fine Motor Coordination (Hand/Wrist)   Handwriting Pt writing 2 long paragraphs in cursive with frequent rest breaks (4 total) required to stretch hand due to cramping - pt however able to maintain 100% legibility but occasionally mixing up letters or leaving out letters.    Grooved pegs Pt placing small beads on tee pegs and removing one at a time for [facility]                       OT Long Term Goals - 08/23/20 1002      OT LONG TERM GOAL #1   Title Independent with HEP for Rt hand coordination (including writing) and shoulder    Baseline dependent    Time 5  Period Weeks    Status Achieved      OT LONG TERM GOAL #2   Title Improve coordination Rt hand as evidenced by reducing speed on 9 hole peg test to 35 sec or less    Baseline 40.72 sec    Time 5    Period Weeks    Status Achieved   33 sec     OT LONG TERM GOAL #3   Title Pt to write 3 sentences in cursive maintaining 100% legibility    Baseline currently only writing in print    Time 5    Period Weeks    Status Achieved      OT LONG TERM GOAL #4   Title Pt to verbalize understanding with memory compensatory strategies and possible A/E to making cooking easier/safer for peeling and cutting    Baseline dependent    Time 5    Period Weeks    Status Achieved   went over memory strategies 08/02/2020, pt does not need A/E for peeling cutting, she reports performing at home without difficulty.     OT LONG TERM GOAL #5   Title Pt to consistently perform cooking tasks at mod I level    Baseline only microwaveable and cold  prep at this time    Time 5    Period Weeks    Status Achieved   Pt performed mod I in clinic, and she cooked a meal at home                Plan - 08/25/20 1035    Clinical Impression Statement Pt has met all LTG's at this time. Pt still has pain w/ writing only (partly d/t CTS) and has to take more frequent rest breaks. Pt has returned to PLOF w/ all BADLS and IADLS.    OT Occupational Profile and History Detailed Assessment- Review of Records and additional review of physical, cognitive, psychosocial history related to current functional performance    Occupational performance deficits (Please refer to evaluation for details): IADL's;Leisure    Body Structure / Function / Physical Skills ADL;UE functional use;Strength;Coordination;FMC;IADL;ROM    Cognitive Skills Memory    Rehab Potential Good    Clinical Decision Making Several treatment options, min-mod task modification necessary    Comorbidities Affecting Occupational Performance: May have comorbidities impacting occupational performance    Modification or Assistance to Complete Evaluation  No modification of tasks or assist necessary to complete eval    OT Frequency 2x / week    OT Duration --   5 WEEKS (plus eval)   OT Treatment/Interventions Self-care/ADL training;DME and/or AE instruction;Moist Heat;Therapeutic activities;Therapeutic exercise;Cognitive remediation/compensation;Coping strategies training;Functional Mobility Training;Passive range of motion;Neuromuscular education;Patient/family education;Manual Therapy;Visual/perceptual remediation/compensation;Paraffin;Fluidtherapy    Plan D/C O.Darene Lamer    Consulted and Agree with Plan of Care Patient           Patient will benefit from skilled therapeutic intervention in order to improve the following deficits and impairments:   Body Structure / Function / Physical Skills: ADL,UE functional use,Strength,Coordination,FMC,IADL,ROM Cognitive Skills: Memory     Visit  Diagnosis: Other lack of coordination  Attention and concentration deficit    Problem List Patient Active Problem List   Diagnosis Date Noted  . Depression 07/05/2020  . CVA (cerebral vascular accident) ([place]) 06/21/2020  . CVA (cerebrovascular accident) ([place]) 06/19/2020  . Abnormality of gait 10/14/2018  . Dizziness and giddiness 09/13/2018  . Debility 06/26/2018  . Chronic nonintractable headache 04/26/2018  . Diastolic dysfunction   .  Congestion of nasal sinus   . Pneumonia of left lower lobe due to infectious organism   . SOB (shortness of breath)   . Candidiasis   . CKD (chronic kidney disease) stage 4, GFR 15-29 ml/min ([facility])   . Acute on chronic anemia   . Hypoalbuminemia due to protein-calorie malnutrition ([place])   . Essential hypertension   . Non-insulin dependent type 2 diabetes mellitus ([place])   . PRES (posterior reversible encephalopathy syndrome)   . Hypokalemia   . History of CVA with residual deficit   . History of seizure 03/25/2018  . Bilateral carpal tunnel syndrome 02/14/2018  . Arthritis 10/31/2017  . Asthma 10/31/2017  . Constipation 10/31/2017  . Kidney stone 10/31/2017  . Anemia of chronic disease 08/09/2017  . Hyperlipidemia 07/26/2017  . 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[place]) 07/25/2017  . Anxiety as acute reaction to exceptional stress 04/23/2017  . Insomnia 10/08/2016  . Neuropathy 06/07/2016  . Gait instability   . Lesion of pons   . Slurred speech   . Dizziness 04/30/2016  . Hyperglycemia 04/30/2016  . Chronic kidney disease (CKD), stage III (moderate) ([place]) 04/30/2016  . Ataxia 04/30/2016  . Chronic right shoulder pain 04/03/2016  . 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 Rhinitis, allergic 08/25/2015  . Asthma, mild intermittent 07/14/2015  . Gastroesophageal reflux disease without esophagitis 06/08/2015  . Hypertensive cardiovascular disease 05/05/2015  . Vitamin D deficiency 05/05/2015   OCCUPATIONAL THERAPY DISCHARGE SUMMARY  Visits from  Start of Care: 7  Current functional level related to goals / functional outcomes: SEE ABOVE   Remaining deficits: None (except hand pain from CTS)    Education / Equipment: HEP  Plan: Patient agrees to discharge.  Patient goals were met. Patient is being discharged due to meeting the stated rehab goals.  ?????        Carey Bullocks, OTR/L 08/25/2020, 11:27 AM  [facility] [address] [place], [place], [address] Phone: [phone]   Fax:  [phone]  Name: Kayelyn Lemon MRN: [id number] Date of Birth: 1971-04-05

## 2020-08-25 NOTE — Therapy (Signed)
[facility] [address] [place], [place], [address] Phone: [phone]   Fax:  [phone]  Physical Therapy Treatment  Patient Details  Name: Christina Orozco MRN: [id number] Date of Birth: 10/07/1970 Referring Provider (PT): Dr. Francine Graven   Encounter Date: 08/25/2020   PT End of Session - 08/25/20 1115    Visit Number 5    Number of Visits 11    Authorization Type BCBS Medicaid - [place] for 7 PT visits 07/19/20 - 08/30/20    Authorization - Visit Number 4    Authorization - Number of Visits 7    PT Start Time 1100    PT Stop Time 1145    PT Time Calculation (min) 45 min    Equipment Utilized During Treatment Gait belt    Activity Tolerance Patient tolerated treatment well;Patient limited by fatigue    Behavior During Therapy [facility] for tasks assessed/performed           Past Medical History:  Diagnosis Date  . Anemia    severe  . Asthma   . CKD (chronic kidney disease)    stage IV  . DDD (degenerative disc disease), cervical   . Diabetes mellitus ([employer])   . Dislocated shoulder    right  . GERD (gastroesophageal reflux disease)   . Hypertension   . Neuropathy   . Stroke [facility])     Past Surgical History:  Procedure Laterality Date  . ADENOIDECTOMY    . APPENDECTOMY    . CERVICAL SPINE SURGERY  06/2012   C5-C6  . EYE SURGERY     left eye surgery   . FOOT SURGERY    . LOOP RECORDER INSERTION N/A 08/27/2017   Procedure: LOOP RECORDER INSERTION;  Surgeon: Sanda Klein, MD;  Location: [place] CV [facility];  Service: Cardiovascular;  Laterality: N/A;  . SHOULDER SURGERY    . TEE WITHOUT CARDIOVERSION N/A 08/27/2017   Procedure: TRANSESOPHAGEAL ECHOCARDIOGRAM (TEE);  Surgeon: Sanda Klein, MD;  Location: [facility] ENDOSCOPY;  Service: Cardiovascular;  Laterality: N/A;  . TONSILLECTOMY      There were no vitals filed for this visit.        Gait training: fast walking, slow walking, horizontal head  turns, vertical head turns: total 420' without AD, min A required with horizontal head turns as pt reported "spinning" and vertical head turns- no dizziness reported. CGA for other gait training.  Standing in corner:  - on floor, wide BOS: horizontal head turns: 20x EO - on floor, narrow BOS: horizontal head turns: 10x EO - on foam with narrow BOS EO: 2 x 30" - on foam with narrow BOS EC: 2 x 30"  Gait training: 1 x 420' with SBA only without AD, cues for arm swings    Access Code: [place] URL: [email or web address] Date: 08/25/2020 Prepared by: Markus Jarvis  Exercises Standing with Head Rotation - 1-2 x daily - 7 x weekly - 1 sets - 20 reps                     PT Short Term Goals - 08/11/20 1104      PT SHORT TERM GOAL #1   Title Pt will be independent with initial HEP in order to build upon functional gains made in therapy. ALL STGS DUE 08/02/20    Baseline 08/11/20: met with current program, will benefit from advancement as pt progressess    Status Achieved    Target Date 08/02/20  PT SHORT TERM GOAL #2   Title Pt will undergo BERG with LTG written as appropriate.    Baseline 46/56 on 08/02/20    Status Achieved      PT SHORT TERM GOAL #3   Title Pt will ambulate at least 100' with LRAD with supervision over level indoor surfaces in order to demo improved household mobility.    Baseline 08/11/20: met in session today    Time --    Period --    Status Achieved      PT SHORT TERM GOAL #4   Title Pt will improve gait speed to at least 1.9 ft/sec with RW vs. LRAD in order to demo decr fall risk.    Baseline 08/11/20: 2.22 ft/sec with RW    Time --    Period --    Status Achieved             PT Long Term Goals - 08/02/20 1321      PT LONG TERM GOAL #1   Title Pt will be independent with final HEP in order to build upon functional gains made in therapy. ALL LTGS DUE 08/30/20    Baseline currently dependent.    Time 7    Period Weeks     Status New      PT LONG TERM GOAL #2   Title Pt will improve BERG to at least a 50/56 in order to demo decr fall risk.    Baseline 46/56 on 08/02/20    Time 7    Period Weeks    Status Revised      PT LONG TERM GOAL #3   Title Pt will improve gait speed to at least 2.3 ft/sec with RW vs. LRAD in order to demo decr fall risk.    Baseline 1.53 ft/sec    Time 7    Period Weeks    Status New      PT LONG TERM GOAL #4   Title Pt will decr 5x sit <> stand time to 19 seconds or less to demo improved BLE strength.    Baseline 26.16 seconds    Time 7    Period Weeks    Status New      PT LONG TERM GOAL #5   Title Pt will decr TUG to 20 seconds or less with LRAD in order to demo decr fall risk,    Baseline 26.84 seconds    Time 7    Period Weeks    Status New                 Plan - 08/25/20 1133    Clinical Impression Statement tolerated treatment well. Pt reported significant dizziness with walking and horizontal head turns. Pt demo increased static sway to her right when standing with narrow BOS and EC on compliant and non compliant surfaces. Notable change in gait noted after balance exercises with improved heel strike and cadence.    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 stroke)    Examination-Activity Limitations Stand;Transfers;Locomotion Level;Stairs    Examination-Participation Restrictions Cleaning;Community Activity;Laundry   cooking   Stability/Clinical Decision Making Stable/Uncomplicated    Rehab Potential Good    PT Frequency 2x / week   plus 1x week for 4 weeks   PT  Duration 3 weeks   plus 1x week for 4 weeks   PT Treatment/Interventions ADLs/Self Care Home Management;DME Instruction;Gait  training;Stair training;Functional mobility training;Therapeutic activities;Patient/family education;Therapeutic exercise;Balance training;Neuromuscular re-education;Passive range of motion;Orthotic Fit/Training;Vestibular    PT Next Visit Plan advance HEP as indicated. SciFit/NuStep. Continue to work on strengthening and balance with decreased UE support. continue to work on gait with no AD/HHA.    PT Home Exercise Plan Access Code: [email or web address]    HQUIQNVVY and Agree with Plan of Care Patient           Patient will benefit from skilled therapeutic intervention in order to improve the following deficits and impairments:  Abnormal gait,Decreased activity tolerance,Decreased balance,Decreased endurance,Decreased coordination,Decreased strength,Difficulty walking  Visit Diagnosis: Muscle weakness (generalized)  Other symptoms and signs involving the nervous system  Unsteadiness on feet  Other abnormalities of gait and mobility     Problem List Patient Active Problem List   Diagnosis Date Noted  . Depression 07/05/2020  . CVA (cerebral vascular accident) ([place]) 06/21/2020  . CVA (cerebrovascular accident) ([place]) 06/19/2020  . Abnormality of gait 10/14/2018  . Dizziness and giddiness 09/13/2018  . Debility 06/26/2018  . Chronic nonintractable headache 04/26/2018  . Diastolic dysfunction   . Congestion of nasal sinus   . Pneumonia of left lower lobe due to infectious organism   . SOB (shortness of breath)   . Candidiasis   . CKD (chronic kidney disease) stage 4, GFR 15-29 ml/min (HCC)   . Acute on chronic anemia   . Hypoalbuminemia due to protein-calorie malnutrition ([place])   . Essential hypertension   . Non-insulin dependent type 2 diabetes mellitus ([place])   . PRES (posterior reversible encephalopathy syndrome)   . Hypokalemia   . History of CVA with residual deficit   . History of seizure 03/25/2018  . Bilateral carpal tunnel syndrome 02/14/2018  . Arthritis 10/31/2017  . Asthma  10/31/2017  . Constipation 10/31/2017  . Kidney stone 10/31/2017  . Anemia of chronic disease 08/09/2017  . Hyperlipidemia 07/26/2017  . 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[place]) 07/25/2017  . Anxiety as acute reaction to exceptional stress 04/23/2017  . Insomnia 10/08/2016  . Neuropathy 06/07/2016  . Gait instability   . Lesion of pons   . Slurred speech   . Dizziness 04/30/2016  . Hyperglycemia 04/30/2016  . Chronic kidney disease (CKD), stage III (moderate) ([place]) 04/30/2016  . Ataxia 04/30/2016  . Chronic right shoulder pain 04/03/2016  . 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 Rhinitis, allergic 08/25/2015  . Asthma, mild intermittent 07/14/2015  . Gastroesophageal reflux disease without esophagitis 06/08/2015  . Hypertensive cardiovascular disease 05/05/2015  . Vitamin D deficiency 05/05/2015    Kerrie Pleasure, PT 08/25/2020, 11:48 AM  [facility] [address] [place], [place], [address] Phone: [phone]   Fax:  [phone]  Name: Christina Orozco MRN: [id number] Date of Birth: [birth year]

## 2020-08-30 ENCOUNTER — Ambulatory Visit: Payer: Medicaid Other | Admitting: Occupational Therapy

## 2020-08-30 ENCOUNTER — Ambulatory Visit: Payer: Medicaid Other | Admitting: Physical Therapy

## 2020-09-01 ENCOUNTER — Other Ambulatory Visit: Payer: Self-pay

## 2020-09-01 ENCOUNTER — Ambulatory Visit: Payer: Medicaid Other | Admitting: Occupational Therapy

## 2020-09-01 ENCOUNTER — Ambulatory Visit: Payer: Medicaid Other | Admitting: Physical Therapy

## 2020-09-01 ENCOUNTER — Encounter: Payer: Self-pay | Admitting: Physical Therapy

## 2020-09-01 DIAGNOSIS — R2681 Unsteadiness on feet: Secondary | ICD-10-CM

## 2020-09-01 DIAGNOSIS — R278 Other lack of coordination: Secondary | ICD-10-CM

## 2020-09-01 DIAGNOSIS — R29818 Other symptoms and signs involving the nervous system: Secondary | ICD-10-CM

## 2020-09-01 DIAGNOSIS — M6281 Muscle weakness (generalized): Secondary | ICD-10-CM

## 2020-09-01 NOTE — Therapy (Signed)
[facility] [address] [place], [place], [address] Phone: [phone]   Fax:  [phone]  Physical Therapy Treatment/Re-Cert  Patient Details  Name: Christina Orozco MRN: [id number] Date of Birth: 06/21/1970 Referring Provider (PT): Dr. Francine Graven   Encounter Date: 09/01/2020   PT End of Session - 09/01/20 0854    Visit Number 6    Number of Visits 12    Authorization Type BCBS Medicaid - [place] for 7 PT visits 07/19/20 - 08/30/20 - requesting new auth on 09/01/20    Authorization - Visit Number 4   used so far in calendar year 2022   Authorization - Number of Visits 7    PT Start Time 0805    PT Stop Time 0845    PT Time Calculation (min) 40 min    Equipment Utilized During Treatment Gait belt    Activity Tolerance Patient tolerated treatment well   limited at one point by dizziness   Behavior During Therapy [facility] for tasks assessed/performed           Past Medical History:  Diagnosis Date  . Anemia    severe  . Asthma   . CKD (chronic kidney disease)    stage IV  . DDD (degenerative disc disease), cervical   . Diabetes mellitus ([place])   . Dislocated shoulder    right  . GERD (gastroesophageal reflux disease)   . Hypertension   . Neuropathy   . Stroke [facility])     Past Surgical History:  Procedure Laterality Date  . ADENOIDECTOMY    . APPENDECTOMY    . CERVICAL SPINE SURGERY  06/2012   C5-C6  . EYE SURGERY     left eye surgery   . FOOT SURGERY    . LOOP RECORDER INSERTION N/A 08/27/2017   Procedure: LOOP RECORDER INSERTION;  Surgeon: Sanda Klein, MD;  Location: [facility];  Service: Cardiovascular;  Laterality: N/A;  . SHOULDER SURGERY    . TEE WITHOUT CARDIOVERSION N/A 08/27/2017   Procedure: TRANSESOPHAGEAL ECHOCARDIOGRAM (TEE);  Surgeon: Sanda Klein, MD;  Location: [facility] ENDOSCOPY;  Service: Cardiovascular;  Laterality: N/A;  . TONSILLECTOMY      There were no vitals filed for this  visit.   Subjective Assessment - 09/01/20 0808    Subjective Doing well. Having no dizziness. Is done with OT.    Pertinent History DM, seizures 2019, HTN, GERD, CKD IV, asthma, neuropathy, anemia, CVA R thalamus 2017    Limitations Walking    Patient Stated Goals wants to be more stable when she walks, improve balance, go back to using no AD.    Currently in Pain? No/denies              [facility] PT Assessment - 09/01/20 0810      Assessment   Medical Diagnosis CVA   subacute pontine infarcts jan 2022, hx of CVA R thalamus from 2017   Referring Provider (PT) Dr. Francine Graven      Prior Function   Level of Independence Independent      Standardized Balance Assessment   Standardized Balance Assessment Dynamic Gait Index      Berg Balance Test   Sit to Stand Able to stand without using hands and stabilize independently    Standing Unsupported Able to stand safely 2 minutes    Sitting with Back Unsupported but Feet Supported on Floor or Stool Able to sit safely and securely 2 minutes    Stand to Sit  Sits safely with minimal use of hands    Transfers Able to transfer safely, minor use of hands    Standing Unsupported with Eyes Closed Able to stand 10 seconds safely    Standing Unsupported with Feet Together Able to place feet together independently and stand for 1 minute with supervision    From Standing, Reach Forward with Outstretched Arm Can reach confidently >25 cm (10")    From Standing Position, Pick up Object from [place] to pick up shoe safely and easily    From Standing Position, Turn to Look Behind Over each Shoulder Looks behind from both sides and weight shifts well    Turn 360 Degrees Able to turn 360 degrees safely one side only in 4 seconds or less   can turn to R, when turning to L rates dizziness as 4/10   Standing Unsupported, Alternately Place Feet on Step/Stool Able to stand independently and safely and complete 8 steps in 20 seconds    Standing Unsupported, One  Foot in Front Able to plae foot ahead of the other independently and hold 30 seconds    Standing on One Leg Tries to lift leg/unable to hold 3 seconds but remains standing independently    Total Score 50    Berg comment: 50/56 = moderate      Dynamic Gait Index   Level Surface Mild Impairment    Change in Gait Speed Normal    Gait with Horizontal Head Turns Severe Impairment   needs assist to keep balance   Gait with Vertical Head Turns Severe Impairment   needs assist to keep balance   Gait and Pivot Turn Moderate Impairment    Step Over Obstacle Mild Impairment    Step Around Obstacles Mild Impairment    Steps Mild Impairment    Total Score 12    DGI comment: 12/24                         [facility] Adult PT Treatment/Exercise - 09/01/20 0810      Transfers   Transfers Stand to Sit;Sit to Stand    Sit to Stand 5: Supervision;Without upper extremity assist    Five time sit to stand comments  12.16 seconds, wide BOS, no UE support, decr eccentric control at times    Stand to Sit 5: Supervision;Without upper extremity assist      Ambulation/Gait   Ambulation/Gait Yes    Ambulation/Gait Assistance 5: Supervision;4: Min guard;4: Min assist    Ambulation/Gait Assistance Details cues for arm swing with gait with no AD, when scanning environment with cane needs min guard for balance, when scanning with no AD - needs min A due to pt demonstrating scissoring gait at times and almost losing her balance with both nods and right/left    Ambulation Distance (Feet) 115 Feet   x3   Assistive device Straight cane    Gait Pattern Step-through pattern;Decreased stance time - right;Decreased dorsiflexion - right;Decreased weight shift to right;Right foot flat    Ambulation Surface Level;Indoor    Gait velocity 14.07 seconds with SPC = 2.33 ft/sec, 15.78 seconds = 2.07 ft/sec                  PT Education - 09/01/20 0854    Education Details progress towards goals, POC going  forwards    Person(s) Educated Patient    Methods Explanation    Comprehension Verbalized understanding  PT Short Term Goals - 08/11/20 1104      PT SHORT TERM GOAL #1   Title Pt will be independent with initial HEP in order to build upon functional gains made in therapy. ALL STGS DUE 08/02/20    Baseline 08/11/20: met with current program, will benefit from advancement as pt progressess    Status Achieved    Target Date 08/02/20      PT SHORT TERM GOAL #2   Title Pt will undergo BERG with LTG written as appropriate.    Baseline 46/56 on 08/02/20    Status Achieved      PT SHORT TERM GOAL #3   Title Pt will ambulate at least 28' with LRAD with supervision over level indoor surfaces in order to demo improved household mobility.    Baseline 08/11/20: met in session today    Time --    Period --    Status Achieved      PT SHORT TERM GOAL #4   Title Pt will improve gait speed to at least 1.9 ft/sec with RW vs. LRAD in order to demo decr fall risk.    Baseline 08/11/20: 2.22 ft/sec with RW    Time --    Period --    Status Achieved             PT Long Term Goals - 09/01/20 4481      PT LONG TERM GOAL #1   Title Pt will be independent with final HEP in order to build upon functional gains made in therapy. ALL LTGS DUE 08/30/20    Baseline currently dependent. - did not assess on 09/01/20 due to time constraints.    Time 7    Period Weeks    Status On-going      PT LONG TERM GOAL #2   Title Pt will improve BERG to at least a 50/56 in order to demo decr fall risk.    Baseline 46/56 on 08/02/20, 50/56 on 09/01/20    Time 7    Period Weeks    Status Achieved      PT LONG TERM GOAL #3   Title Pt will improve gait speed to at least 2.3 ft/sec with RW vs. LRAD in order to demo decr fall risk.    Baseline 1.53 ft/sec, 14.07 seconds with SPC = 2.33 ft/sec on 09/01/20    Time 7    Period Weeks    Status Achieved      PT LONG TERM GOAL #4   Title Pt will decr 5x sit <>  stand time to 19 seconds or less to demo improved BLE strength.    Baseline 26.16 seconds, 12.16 seconds on 09/01/20    Time 7    Period Weeks    Status Achieved      PT LONG TERM GOAL #5   Title Pt will decr TUG to 20 seconds or less with LRAD in order to demo decr fall risk,    Baseline 26.84 seconds, did not assess on 09/01/20 due to time constraints    Time 7    Period Weeks    Status On-going          Revised LTGs for re-cert:      PT Long Term Goals - 09/01/20 0906      PT LONG TERM GOAL #1   Title Pt will be independent with final HEP in order to build upon functional gains made in therapy. ALL LTGS DUE 09/29/20  Baseline will need to review HEP and update as appropriate.    Time 4    Period Weeks    Status On-going    Target Date 09/29/20      PT LONG TERM GOAL #2   Title Pt will improve DGI to at least a 17/24 in order to demo decr fall risk.    Baseline 12/24    Time 4    Period Weeks    Status New      PT LONG TERM GOAL #3   Title Pt will improve gait speed to at least 2.65 ft/sec with no AD vs. LRAD in order to improved community mobility.    Baseline 14.07 seconds with SPC = 2.33 ft/sec on 09/01/20    Time 4    Period Weeks    Status Revised      PT LONG TERM GOAL #4   Title Pt will perform 230' of gait on level indoor surfaces while scanning environment with supervision with no AD vs. LRAD in order to demo decr fall risk.    Baseline min guard when using cane, min A with no AD.    Time 4    Period Weeks    Status New      PT LONG TERM GOAL #5   Title Pt will ambulate at least 300' with no AD vs. LRAD outdoors over unlevel surfaces and perform a curb with supervision in order to demo improved community mobility.    Baseline not yet assessed.    Time 4    Period Weeks    Status New               Plan - 09/01/20 0901    Clinical Impression Statement Checked LTGs for medicaid re-cert and re-auth. Pt missed a few visits due to [place]. Pt met LTGs  #2-5. Pt improved gait speed with cane to 2.33 ft/sec, indicating a limited community ambulator. Pt improved BERG to a 50/56 (previously 46/56). Pt improved 5x sit <> stand to 12.16 seconds with no UE support (previously 26.16 with UE support). Performed the DGI today with pt scoring a 12/24 with no AD, indicating an incr risk for falls. Pt with incr difficulty with head turns/nods during gait needing min A to prevent from losing her balance. Pt had 4/10 dizziness and needed a seated rest break after performing a turn to the L in the BERG. Pt's goals are to return to PLOF and walk with no AD. Discussed to continue to use a cane at all times due to fall risk. LTGs revised/updated as appropriate. Will continue to address gait, balance, strength, coordination, dizziness in order to decr fall risk and improve functional mobility independnece.    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 stroke)    Examination-Activity Limitations Stand;Transfers;Locomotion Level;Stairs    Examination-Participation Restrictions Cleaning;Community Activity;Laundry   cooking   Stability/Clinical Decision Making Stable/Uncomplicated    Rehab Potential Good    PT Frequency 2x / week   followed by 1x week for 2 weeks   PT Duration 2 weeks   followed by 1x week for 2 weeks   PT Treatment/Interventions ADLs/Self Care Home Management;DME Instruction;Gait training;Stair training;Functional mobility training;Therapeutic activities;Patient/family education;Therapeutic exercise;Balance training;Neuromuscular re-education;Passive range of motion;Orthotic Fit/Training;Vestibular    PT Next  Visit Plan review and advance HEP. gait with no AD. assess corner balance on compliant  surfaces with vision removed. gait/balance with head motions. turns.    PT Home Exercise Plan Access Code: [email or web address]    SUORVIFBP and Agree with Plan of Care Patient           Patient will benefit from skilled therapeutic intervention in order to improve the following deficits and impairments:  Abnormal gait,Decreased activity tolerance,Decreased balance,Decreased endurance,Decreased coordination,Decreased strength,Difficulty walking,Dizziness  Visit Diagnosis: Other lack of coordination  Muscle weakness (generalized)  Other symptoms and signs involving the nervous system  Unsteadiness on feet     Problem List Patient Active Problem List   Diagnosis Date Noted  . Depression 07/05/2020  . CVA (cerebral vascular accident) ([place]) 06/21/2020  . CVA (cerebrovascular accident) ([place]) 06/19/2020  . Abnormality of gait 10/14/2018  . Dizziness and giddiness 09/13/2018  . Debility 06/26/2018  . Chronic nonintractable headache 04/26/2018  . Diastolic dysfunction   . Congestion of nasal sinus   . Pneumonia of left lower lobe due to infectious organism   . SOB (shortness of breath)   . Candidiasis   . CKD (chronic kidney disease) stage 4, GFR 15-29 ml/min (HCC)   . Acute on chronic anemia   . Hypoalbuminemia due to protein-calorie malnutrition ([place])   . Essential hypertension   . Non-insulin dependent type 2 diabetes mellitus ([place])   . PRES (posterior reversible encephalopathy syndrome)   . Hypokalemia   . History of CVA with residual deficit   . History of seizure 03/25/2018  . Bilateral carpal tunnel syndrome 02/14/2018  . Arthritis 10/31/2017  . Asthma 10/31/2017  . Constipation 10/31/2017  . Kidney stone 10/31/2017  . Anemia of chronic disease 08/09/2017  . Hyperlipidemia 07/26/2017  . 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[place]) 07/25/2017  . Anxiety as acute reaction to exceptional stress 04/23/2017  . Insomnia 10/08/2016  . Neuropathy 06/07/2016  . Gait instability   . Lesion of pons   .  Slurred speech   . Dizziness 04/30/2016  . Hyperglycemia 04/30/2016  . Chronic kidney disease (CKD), stage III (moderate) ([place]) 04/30/2016  . Ataxia 04/30/2016  . Chronic right shoulder pain 04/03/2016  . 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 Rhinitis, allergic 08/25/2015  . Asthma, mild intermittent 07/14/2015  . Gastroesophageal reflux disease without esophagitis 06/08/2015  . Hypertensive cardiovascular disease 05/05/2015  . Vitamin D deficiency 05/05/2015    Arliss Journey, PT, DPT  09/01/2020, 9:05 AM  [facility] [address] [place], [place], [address] Phone: [phone]   Fax:  [phone]  Name: Ketra Duchesne MRN: [id number] Date of Birth: Aug 04, 1970

## 2020-09-06 ENCOUNTER — Encounter: Payer: Self-pay | Admitting: Physical Therapy

## 2020-09-06 ENCOUNTER — Ambulatory Visit: Payer: Medicaid Other | Admitting: Physical Therapy

## 2020-09-06 ENCOUNTER — Other Ambulatory Visit: Payer: Self-pay

## 2020-09-06 DIAGNOSIS — R2681 Unsteadiness on feet: Secondary | ICD-10-CM | POA: Diagnosis not present

## 2020-09-06 DIAGNOSIS — R278 Other lack of coordination: Secondary | ICD-10-CM

## 2020-09-06 DIAGNOSIS — M6281 Muscle weakness (generalized): Secondary | ICD-10-CM

## 2020-09-06 DIAGNOSIS — R29818 Other symptoms and signs involving the nervous system: Secondary | ICD-10-CM

## 2020-09-06 NOTE — Therapy (Signed)
[facility] [address] [place], [place], [address] Phone: [phone]   Fax:  [phone]  Physical Therapy Treatment  Patient Details  Name: Christina Orozco MRN: [id number] Date of Birth: Jan 23, 1971 Referring Provider (PT): Dr. Francine Graven   Encounter Date: 09/06/2020   PT End of Session - 09/06/20 1516    Visit Number 7    Number of Visits 12    Authorization Type BCBS Medicaid - [place] for 7 PT visits 07/19/20 - 08/30/20 - requesting new auth on 09/01/20    Authorization - Visit Number 1   used so far in calendar year 2022   Authorization - Number of Visits 6    PT Start Time 0807   pt arrived late   PT Stop Time 0845    PT Time Calculation (min) 38 min    Equipment Utilized During Treatment Gait belt    Activity Tolerance Patient tolerated treatment well   limited at one point by dizziness   Behavior During Therapy [facility] for tasks assessed/performed           Past Medical History:  Diagnosis Date  . Anemia    severe  . Asthma   . CKD (chronic kidney disease)    stage IV  . DDD (degenerative disc disease), cervical   . Diabetes mellitus ([place])   . Dislocated shoulder    right  . GERD (gastroesophageal reflux disease)   . Hypertension   . Neuropathy   . Stroke [facility])     Past Surgical History:  Procedure Laterality Date  . ADENOIDECTOMY    . APPENDECTOMY    . CERVICAL SPINE SURGERY  06/2012   C5-C6  . EYE SURGERY     left eye surgery   . FOOT SURGERY    . LOOP RECORDER INSERTION N/A 08/27/2017   Procedure: LOOP RECORDER INSERTION;  Surgeon: Sanda Klein, MD;  Location: [place] [facility] CV LAB;  Service: Cardiovascular;  Laterality: N/A;  . SHOULDER SURGERY    . TEE WITHOUT CARDIOVERSION N/A 08/27/2017   Procedure: TRANSESOPHAGEAL ECHOCARDIOGRAM (TEE);  Surgeon: Sanda Klein, MD;  Location: [facility] ENDOSCOPY;  Service: Cardiovascular;  Laterality: N/A;  . TONSILLECTOMY      There were no vitals filed  for this visit.   Subjective Assessment - 09/06/20 0810    Subjective No falls. Was having some dizziness on saturday over the weekend when looking at her phone, then it went away. When she was dizzy on saturday, reporting her balance was really off. Feeling fine right now.    Pertinent History DM, seizures 2019, HTN, GERD, CKD IV, asthma, neuropathy, anemia, CVA R thalamus 2017    Limitations Walking    Patient Stated Goals wants to be more stable when she walks, improve balance, go back to using no AD.    Currently in Pain? No/denies                             [facility] Adult PT Treatment/Exercise - 09/06/20 0001      Ambulation/Gait   Ambulation/Gait Yes    Ambulation/Gait Assistance 5: Supervision;4: Min guard;4: Min assist    Ambulation/Gait Assistance Details with no AD, with scanning environment L/R and up/down with min guard for safety with balance    Ambulation Distance (Feet) 230 Feet    Assistive device None    Gait Pattern Step-through pattern;Decreased stance time - right;Decreased dorsiflexion - right;Decreased weight shift to right;Right foot  flat    Ambulation Surface Level;Indoor               Balance Exercises - 09/06/20 0834      Balance Exercises: Standing   Standing Eyes Opened Narrow base of support (BOS)    Standing Eyes Opened Limitations with feet together: x10 reps head turns, x10 reps head nods on level ground           Access Code: [email or web address] URL: [email or web address] Date: 09/06/2020 Prepared by: Janann August  Revised and upgraded pt's HEP:   Exercises Sit to Stand with Arms Crossed - 1 x daily - 5 x weekly - 1 sets - 10 reps Heel Toe Raises with Counter Support - 1 x daily - 5 x weekly - 1 sets - 10 reps Standing Near Stance in Corner with Eyes Closed - 1 x daily - 5 x weekly - 1 sets - 3 reps - 20 hold - decr time down to 20 seconds, performed initially at 30 seconds with pt reporting 5/10 dizziness   Standing Marching - 1 x daily - 5 x weekly - 3 sets - upgraded to walking marching down and back at countertop  Standing Tandem Balance with Counter Support - 1 x daily - 5 x weekly - 1 sets - 3 reps - 20 hold Backward Walking with Counter Support - 1 x daily - 5 x weekly - 3 sets Standing with Head Rotation - 1 x daily - 5 x weekly - 1-2 sets - 10 reps - in corner with wide BOS on a single pillow, pt tolerated it well with no dizziness    PT Education - 09/06/20 1227    Education Details upgrades to HEP.    Person(s) Educated Patient    Methods Explanation    Comprehension Verbalized understanding            PT Short Term Goals - 09/01/20 0906      PT SHORT TERM GOAL #1   Title ALL STGS = LTGS             PT Long Term Goals - 09/01/20 0906      PT LONG TERM GOAL #1   Title Pt will be independent with final HEP in order to build upon functional gains made in therapy. ALL LTGS DUE 09/29/20    Baseline will need to review HEP and update as appropriate.    Time 4    Period Weeks    Status On-going    Target Date 09/29/20      PT LONG TERM GOAL #2   Title Pt will improve DGI to at least a 17/24 in order to demo decr fall risk.    Baseline 12/24    Time 4    Period Weeks    Status New      PT LONG TERM GOAL #3   Title Pt will improve gait speed to at least 2.65 ft/sec with no AD vs. LRAD in order to improved community mobility.    Baseline 14.07 seconds with SPC = 2.33 ft/sec on 09/01/20    Time 4    Period Weeks    Status Revised      PT LONG TERM GOAL #4   Title Pt will perform 230' of gait on level indoor surfaces while scanning environment with supervision with no AD vs. LRAD in order to demo decr fall risk.    Baseline min guard when using cane, min A with no AD.  Time 4    Period Weeks    Status New      PT LONG TERM GOAL #5   Title Pt will ambulate at least 300' with no AD vs. LRAD outdoors over unlevel surfaces and perform a curb with supervision in order  to demo improved community mobility.    Baseline not yet assessed.    Time 4    Period Weeks    Status New                 Plan - 09/06/20 1520    Clinical Impression Statement Today's skilled session focused on upgrading pt's HEP for balance. Pt reporting incr dizziness to a 5/10 when holding feet together on level ground with eyes closed for 30 seconds. When decr to 20 seconds, pt reporting no dizziness. Pt reporting very minimal dizziness today with gait with head motions and needing min guard. Will continue to progress towards LTGs.    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 stroke)    Examination-Activity Limitations Stand;Transfers;Locomotion Level;Stairs    Examination-Participation Restrictions Cleaning;Community Activity;Laundry   cooking   Stability/Clinical Decision Making Stable/Uncomplicated    Rehab Potential Good    PT Frequency 2x / week   followed by 1x week for 2 weeks   PT Duration 2 weeks   followed by 1x week for 2 weeks   PT Treatment/Interventions ADLs/Self Care Home Management;DME Instruction;Gait training;Stair training;Functional mobility training;Therapeutic activities;Patient/family education;Therapeutic exercise;Balance training;Neuromuscular re-education;Passive range of motion;Orthotic Fit/Training;Vestibular    PT Next Visit Plan gait with no AD. corner balance on compliant surfaces with vision removed. gait/balance with head motions. turns.    PT Home Exercise Plan Access Code: [email or web address] and Agree with Plan of Care Patient           Patient will benefit from skilled therapeutic intervention in order to improve the following deficits and impairments:  Abnormal  gait,Decreased activity tolerance,Decreased balance,Decreased endurance,Decreased coordination,Decreased strength,Difficulty walking,Dizziness  Visit Diagnosis: Other lack of coordination  Muscle weakness (generalized)  Other symptoms and signs involving the nervous system  Unsteadiness on feet     Problem List Patient Active Problem List   Diagnosis Date Noted  . Depression 07/05/2020  . CVA (cerebral vascular accident) ([place]) 06/21/2020  . CVA (cerebrovascular accident) ([place]) 06/19/2020  . Abnormality of gait 10/14/2018  . Dizziness and giddiness 09/13/2018  . Debility 06/26/2018  . Chronic nonintractable headache 04/26/2018  . Diastolic dysfunction   . Congestion of nasal sinus   . Pneumonia of left lower lobe due to infectious organism   . SOB (shortness of breath)   . Candidiasis   . CKD (chronic kidney disease) stage 4, GFR 15-29 ml/min ([facility])   . Acute on chronic anemia   . Hypoalbuminemia due to protein-calorie malnutrition ([place])   . Essential hypertension   . Non-insulin dependent type 2 diabetes mellitus ([place])   . PRES (posterior reversible encephalopathy syndrome)   . Hypokalemia   . History of CVA with residual deficit   . History of seizure 03/25/2018  . Bilateral carpal tunnel syndrome 02/14/2018  . Arthritis 10/31/2017  . Asthma 10/31/2017  . Constipation 10/31/2017  . Kidney stone 10/31/2017  . Anemia of chronic disease 08/09/2017  . Hyperlipidemia 07/26/2017  . 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  [place]) 07/25/2017  . Anxiety as acute reaction to exceptional stress 04/23/2017  . Insomnia 10/08/2016  . Neuropathy 06/07/2016  . Gait instability   . Lesion of pons   . Slurred speech   . Dizziness 04/30/2016  . Hyperglycemia 04/30/2016  . Chronic kidney disease (CKD), stage III (moderate) ([place]) 04/30/2016  . Ataxia 04/30/2016  . Chronic right shoulder pain 04/03/2016  . 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 Rhinitis, allergic 08/25/2015  . Asthma, mild  intermittent 07/14/2015  . Gastroesophageal reflux disease without esophagitis 06/08/2015  . Hypertensive cardiovascular disease 05/05/2015  . Vitamin D deficiency 05/05/2015    Arliss Journey, PT, DPT  09/06/2020, 3:22 PM  [facility] [address] [place], [place], [address] Phone: [phone]   Fax:  [phone]  Name: Kimberley Gummo MRN: [id number] Date of Birth: September 26, 1970

## 2020-09-06 NOTE — Patient Instructions (Signed)
Access Code: [email or web address] URL: [email or web address] Date: 09/06/2020 Prepared by: Janann August  Exercises Sit to Stand with Arms Crossed - 1 x daily - 5 x weekly - 1 sets - 10 reps Heel Toe Raises with Counter Support - 1 x daily - 5 x weekly - 1 sets - 10 reps Standing Near Stance in Corner with Eyes Closed - 1 x daily - 5 x weekly - 1 sets - 3 reps - 20 hold Standing Marching - 1 x daily - 5 x weekly - 3 sets Standing Tandem Balance with Counter Support - 1 x daily - 5 x weekly - 1 sets - 3 reps - 20 hold Backward Walking with Counter Support - 1 x daily - 5 x weekly - 3 sets Standing with Head Rotation - 1 x daily - 5 x weekly - 1-2 sets - 10 reps

## 2020-09-08 ENCOUNTER — Ambulatory Visit: Payer: Medicaid Other | Admitting: Physical Therapy

## 2020-09-08 ENCOUNTER — Encounter: Payer: Medicaid Other | Admitting: Occupational Therapy

## 2020-09-08 ENCOUNTER — Other Ambulatory Visit: Payer: Self-pay

## 2020-09-08 ENCOUNTER — Encounter: Payer: Self-pay | Admitting: Physical Therapy

## 2020-09-08 DIAGNOSIS — R2681 Unsteadiness on feet: Secondary | ICD-10-CM | POA: Diagnosis not present

## 2020-09-08 DIAGNOSIS — R29818 Other symptoms and signs involving the nervous system: Secondary | ICD-10-CM

## 2020-09-08 DIAGNOSIS — R278 Other lack of coordination: Secondary | ICD-10-CM

## 2020-09-08 DIAGNOSIS — M6281 Muscle weakness (generalized): Secondary | ICD-10-CM

## 2020-09-08 NOTE — Therapy (Signed)
[facility] [address] [place], [place], [address] Phone: [phone]   Fax:  [phone]  Physical Therapy Treatment  Patient Details  Name: Christina Orozco MRN: [id number] Date of Birth: 01-27-1971 Referring Provider (PT): Dr. Francine Graven   Encounter Date: 09/08/2020   PT End of Session - 09/08/20 0852    Visit Number 8    Number of Visits 12    Authorization Type BCBS Medicaid - [place] for 7 PT visits 07/19/20 - 08/30/20 - requesting new auth on 09/01/20    Authorization - Visit Number 2   used so far in calendar year 2022   Authorization - Number of Visits 6    PT Start Time 0802    PT Stop Time [id number]    PT Time Calculation (min) 42 min    Equipment Utilized During Treatment Gait belt    Activity Tolerance Patient tolerated treatment well    Behavior During Therapy [facility] for tasks assessed/performed           Past Medical History:  Diagnosis Date  . Anemia    severe  . Asthma   . CKD (chronic kidney disease)    stage IV  . DDD (degenerative disc disease), cervical   . Diabetes mellitus ([place])   . Dislocated shoulder    right  . GERD (gastroesophageal reflux disease)   . Hypertension   . Neuropathy   . Stroke [facility])     Past Surgical History:  Procedure Laterality Date  . ADENOIDECTOMY    . APPENDECTOMY    . CERVICAL SPINE SURGERY  06/2012   C5-C6  . EYE SURGERY     left eye surgery   . FOOT SURGERY    . LOOP RECORDER INSERTION N/A 08/27/2017   Procedure: LOOP RECORDER INSERTION;  Surgeon: Sanda Klein, MD;  Location: [facility];  Service: Cardiovascular;  Laterality: N/A;  . SHOULDER SURGERY    . TEE WITHOUT CARDIOVERSION N/A 08/27/2017   Procedure: TRANSESOPHAGEAL ECHOCARDIOGRAM (TEE);  Surgeon: Sanda Klein, MD;  Location[facility] ENDOSCOPY;  Service: Cardiovascular;  Laterality: N/A;  . TONSILLECTOMY      There were no vitals filed for this visit.   Subjective Assessment - 09/08/20  0804    Subjective No changes, no dizziness now, but has had 2 dizzy spells since she was last here.    Pertinent History DM, seizures 2019, HTN, GERD, CKD IV, asthma, neuropathy, anemia, CVA R thalamus 2017    Limitations Walking    Patient Stated Goals wants to be more stable when she walks, improve balance, go back to using no AD.    Currently in Pain? No/denies                             [facility] Adult PT Treatment/Exercise - 09/08/20 0810      Ambulation/Gait   Ambulation/Gait Yes    Ambulation/Gait Assistance 5: Supervision;4: Min guard    Ambulation/Gait Assistance Details with no AD, focus on arm swing during gait, walking over 2 compliant mats, and scanning environment, pt only reporting mild dizziness 1/10 with head motions that subsided easily   Ambulation Distance (Feet) 315 Feet    Assistive device None    Gait Pattern Step-through pattern;Decreased stance time - right;Decreased dorsiflexion - right;Decreased weight shift to right;Right foot flat    Ambulation Surface Level;Indoor  Balance Exercises - 09/08/20 0824      Balance Exercises: Standing   Standing Eyes Opened Narrow base of support (BOS);Foam/compliant surface;Limitations    Standing Eyes Opened Limitations beginning with feet hip width and then narrow BOS x10 reps head turns R/L (without stopping in the middle) - no dizziness    SLS with Vectors Solid surface;Foam/compliant surface;Intermittent upper extremity assist;Limitations    SLS with Vectors Limitations standing on level ground - alternating forward cone taps, then performing on blue air ex: x10 reps tapping RLE to cone, x10 reps alternating between feet and tapping    Stepping Strategy Anterior;Posterior;Limitations;10 reps    Stepping Strategy Limitations on blue foam beam: alternating stepping each direction x10 reps each, intermittent UE support at countertop    Step Ups 4 inch;Forward;Intermittent UE  support;Limitations    Step Ups Limitations with blue air ex on top, x10 reps leading with each leg, incr difficulty with LLE    Step Over Hurdles / Cones stepping over 4 smaller hurdles and one larger orange hurdle next to countertop, using UE support, down and back x4 reps,  A couple of times pt trying without UE support and needing min A for balance               PT Short Term Goals - 09/01/20 0906      PT SHORT TERM GOAL #1   Title ALL STGS = LTGS             PT Long Term Goals - 09/01/20 0906      PT LONG TERM GOAL #1   Title Pt will be independent with final HEP in order to build upon functional gains made in therapy. ALL LTGS DUE 09/29/20    Baseline will need to review HEP and update as appropriate.    Time 4    Period Weeks    Status On-going    Target Date 09/29/20      PT LONG TERM GOAL #2   Title Pt will improve DGI to at least a 17/24 in order to demo decr fall risk.    Baseline 12/24    Time 4    Period Weeks    Status New      PT LONG TERM GOAL #3   Title Pt will improve gait speed to at least 2.65 ft/sec with no AD vs. LRAD in order to improved community mobility.    Baseline 14.07 seconds with SPC = 2.33 ft/sec on 09/01/20    Time 4    Period Weeks    Status Revised      PT LONG TERM GOAL #4   Title Pt will perform 230' of gait on level indoor surfaces while scanning environment with supervision with no AD vs. LRAD in order to demo decr fall risk.    Baseline min guard when using cane, min A with no AD.    Time 4    Period Weeks    Status New      PT LONG TERM GOAL #5   Title Pt will ambulate at least 300' with no AD vs. LRAD outdoors over unlevel surfaces and perform a curb with supervision in order to demo improved community mobility.    Baseline not yet assessed.    Time 4    Period Weeks    Status New                 Plan - 09/08/20 [id number]    Clinical Impression Statement  Today's skilled session focused on gait training with no AD,  SLS balance, and balance on compliant surfaces. Pt tolerated session well, needing min guard/min A at times for decr UE support for SLS balance. Pt with only one episode of mild dizziness rated as 1/10 with head motions during gait that subsided quickly. Will benefit from a brief vestibular screen. Will continue to progress towards LTGs.    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 stroke)    Examination-Activity Limitations Stand;Transfers;Locomotion Level;Stairs    Examination-Participation Restrictions Cleaning;Community Activity;Laundry   cooking   Stability/Clinical Decision Making Stable/Uncomplicated    Rehab Potential Good    PT Frequency 2x / week   followed by 1x week for 2 weeks   PT Duration 2 weeks   followed by 1x week for 2 weeks   PT Treatment/Interventions ADLs/Self Care Home Management;DME Instruction;Gait training;Stair training;Functional mobility training;Therapeutic activities;Patient/family education;Therapeutic exercise;Balance training;Neuromuscular re-education;Passive range of motion;Orthotic Fit/Training;Vestibular    PT Next Visit Plan vestibular screen/assessment (pt mainly has dizziness with head motions during gait, scrolling up/down on her phone). gait with no AD. SLS balance. corner balance on compliant surfaces with vision removed. gait/balance with head motions.    PT Home Exercise Plan Access Code: [email or web address] and Agree with Plan of Care Patient           Patient will benefit from skilled therapeutic intervention in order to improve the following deficits and impairments:  Abnormal gait,Decreased activity tolerance,Decreased balance,Decreased endurance,Decreased coordination,Decreased  strength,Difficulty walking,Dizziness  Visit Diagnosis: Other lack of coordination  Muscle weakness (generalized)  Other symptoms and signs involving the nervous system  Unsteadiness on feet     Problem List Patient Active Problem List   Diagnosis Date Noted  . Depression 07/05/2020  . CVA (cerebral vascular accident) ([place]) 06/21/2020  . CVA (cerebrovascular accident) ([place]) 06/19/2020  . Abnormality of gait 10/14/2018  . Dizziness and giddiness 09/13/2018  . Debility 06/26/2018  . Chronic nonintractable headache 04/26/2018  . Diastolic dysfunction   . Congestion of nasal sinus   . Pneumonia of left lower lobe due to infectious organism   . SOB (shortness of breath)   . Candidiasis   . CKD (chronic kidney disease) stage 4, GFR 15-29 ml/min (H[facility])   . Acute on chronic anemia   . Hypoalbuminemia due to protein-calorie malnutrition ([place])   . Essential hypertension   . Non-insulin dependent type 2 diabetes mellitus ([place])   . PRES (posterior reversible encephalopathy syndrome)   . Hypokalemia   . History of CVA with residual deficit   . History of seizure 03/25/2018  . Bilateral carpal tunnel syndrome 02/14/2018  . Arthritis 10/31/2017  . Asthma 10/31/2017  . Constipation 10/31/2017  . Kidney stone 10/31/2017  . Anemia of chronic disease 08/09/2017  . Hyperlipidemia 07/26/2017  . 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[place]) 07/25/2017  . Anxiety as acute reaction to exceptional stress 04/23/2017  . Insomnia 10/08/2016  . Neuropathy 06/07/2016  . Gait instability   . Lesion of pons   . Slurred speech   . Dizziness 04/30/2016  . Hyperglycemia 04/30/2016  . Chronic kidney disease (CKD), stage III (moderate) ([place]) 04/30/2016  . Ataxia 04/30/2016  . Chronic right shoulder pain 04/03/2016  . 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 Rhinitis, allergic 08/25/2015  .  Asthma, mild intermittent 07/14/2015  . Gastroesophageal reflux disease without esophagitis 06/08/2015  . Hypertensive  cardiovascular disease 05/05/2015  . Vitamin D deficiency 05/05/2015    Christina Orozco, PT, DPT  09/08/2020, 8:56 AM  [facility] [address] [place], [place], [address] Phone: [phone]   Fax:  [phone]  Name: Christina Orozco MRN: [id number] Date of Birth: 1971/01/14

## 2020-09-13 ENCOUNTER — Ambulatory Visit: Payer: Medicaid Other

## 2020-09-15 ENCOUNTER — Other Ambulatory Visit: Payer: Self-pay

## 2020-09-15 ENCOUNTER — Encounter: Payer: Self-pay | Admitting: Physical Therapy

## 2020-09-15 ENCOUNTER — Ambulatory Visit: Payer: Medicaid Other | Attending: Internal Medicine | Admitting: Physical Therapy

## 2020-09-15 DIAGNOSIS — R29818 Other symptoms and signs involving the nervous system: Secondary | ICD-10-CM

## 2020-09-15 DIAGNOSIS — R2689 Other abnormalities of gait and mobility: Secondary | ICD-10-CM | POA: Insufficient documentation

## 2020-09-15 DIAGNOSIS — R2681 Unsteadiness on feet: Secondary | ICD-10-CM | POA: Diagnosis present

## 2020-09-15 DIAGNOSIS — R42 Dizziness and giddiness: Secondary | ICD-10-CM | POA: Insufficient documentation

## 2020-09-15 DIAGNOSIS — R278 Other lack of coordination: Secondary | ICD-10-CM

## 2020-09-15 DIAGNOSIS — M6281 Muscle weakness (generalized): Secondary | ICD-10-CM

## 2020-09-15 NOTE — Therapy (Signed)
[facility] [address] [place], [place], [address] Phone: [phone]   Fax:  [phone]  Physical Therapy Treatment  Patient Details  Name: Christina Orozco MRN: [id number] Date of Birth: Jun 03, 1971 Referring Provider (PT): Dr. Francine Graven   Encounter Date: 09/15/2020   PT End of Session - 09/15/20 0816    Visit Number 9    Number of Visits 12    Authorization Type BCBS Medicaid - [place] for 7 PT visits 07/19/20 - 08/30/20 - requesting new auth on 09/01/20    Authorization - Visit Number 3   used so far in calendar year 2022   Authorization - Number of Visits 6    PT Start Time 0807    PT Stop Time 0845    PT Time Calculation (min) 38 min    Equipment Utilized During Treatment Gait belt    Activity Tolerance Patient tolerated treatment well    Behavior During Therapy [facility] for tasks assessed/performed           Past Medical History:  Diagnosis Date  . Anemia    severe  . Asthma   . CKD (chronic kidney disease)    stage IV  . DDD (degenerative disc disease), cervical   . Diabetes mellitus ([place])   . Dislocated shoulder    right  . GERD (gastroesophageal reflux disease)   . Hypertension   . Neuropathy   . Stroke [facility])     Past Surgical History:  Procedure Laterality Date  . ADENOIDECTOMY    . APPENDECTOMY    . CERVICAL SPINE SURGERY  06/2012   C5-C6  . EYE SURGERY     left eye surgery   . FOOT SURGERY    . LOOP RECORDER INSERTION N/A 08/27/2017   Procedure: LOOP RECORDER INSERTION;  Surgeon: Sanda Klein, MD;  Location: [facility];  Service: Cardiovascular;  Laterality: N/A;  . SHOULDER SURGERY    . TEE WITHOUT CARDIOVERSION N/A 08/27/2017   Procedure: TRANSESOPHAGEAL ECHOCARDIOGRAM (TEE);  Surgeon: Sanda Klein, MD;  Location: [facility] ENDOSCOPY;  Service: Cardiovascular;  Laterality: N/A;  . TONSILLECTOMY      There were no vitals filed for this visit.   Subjective Assessment - 09/15/20  0809    Subjective Had a fall over the weekend. Got up from bed to walk to the bathroom and my "legs gave out". Landed on her side. No injury except for left buttocks pain today. Then hit her head on Monday when she was getting something off the floor and came up into the table.    Pertinent History DM, seizures 2019, HTN, GERD, CKD IV, asthma, neuropathy, anemia, CVA R thalamus 2017    Limitations Walking    Patient Stated Goals wants to be more stable when she walks, improve balance, go back to using no AD.    Currently in Pain? Yes    Pain Score 10-Worst pain ever    Pain Location Buttocks    Pain Orientation Left    Pain Descriptors / Indicators Sharp;Throbbing    Pain Type Acute pain    Pain Radiating Towards into hip    Pain Onset In the past 7 days    Pain Frequency Constant    Aggravating Factors  recent fall    Pain Relieving Factors lying on right side                 [facility] Adult PT Treatment/Exercise - 09/15/20 0818      Transfers  Transfers Stand to Sit;Sit to Stand    Sit to Stand 5: Supervision;Without upper extremity assist    Stand to Sit 5: Supervision;Without upper extremity assist      Ambulation/Gait   Ambulation/Gait Yes    Ambulation/Gait Assistance 5: Supervision    Ambulation/Gait Assistance Details wtih cane due to buttock/hip pain, gait with scanning all directions randomly, mild dizziness reported with no balance issues noted.    Ambulation Distance (Feet) 230 Feet   x1, plus around gym with session   Assistive device Straight cane    Gait Pattern Step-through pattern;Decreased stance time - right;Decreased dorsiflexion - right;Decreased weight shift to right;Right foot flat    Ambulation Surface Level;Indoor      Self-Care   Self-Care Other Self-Care Comments    Other Self-Care Comments  pt assess by PT due to recent fall with increased pain. No significant issues noted by PT on assessment with pt cleared to continue with current plan of care.                Balance Exercises - 09/15/20 0820      Balance Exercises: Standing   Standing Eyes Closed Wide (BOA);Foam/compliant surface;Other reps (comment);30 secs;Limitations;Head turns    Standing Eyes Closed Limitations on airex with no UE support, feet hip width apart: EC 30 sec's x 3 reps, progressing tofeet futher apart for EC head movements left<>right, up<>down for 10 reps each. min guard assist with balance with mild postural sway noted.    Tandem Gait Forward;Retro;Upper extremity support;Foam/compliant surface;3 reps;Limitations    Tandem Gait Limitations on blue foam beam for 3 laps each way with light touch to bars for balance. min guard to min assist for balance/safety.    Sidestepping Foam/compliant support;3 reps;Limitations    Sidestepping Limitations on blue foam beam with intermittent touch to bars for balance for 3 laps each way. min guard to min assist for balance/safety               PT Short Term Goals - 09/01/20 0906      PT SHORT TERM GOAL #1   Title ALL STGS = LTGS             PT Long Term Goals - 09/01/20 0906      PT LONG TERM GOAL #1   Title Pt will be independent with final HEP in order to build upon functional gains made in therapy. ALL LTGS DUE 09/29/20    Baseline will need to review HEP and update as appropriate.    Time 4    Period Weeks    Status On-going    Target Date 09/29/20      PT LONG TERM GOAL #2   Title Pt will improve DGI to at least a 17/24 in order to demo decr fall risk.    Baseline 12/24    Time 4    Period Weeks    Status New      PT LONG TERM GOAL #3   Title Pt will improve gait speed to at least 2.65 ft/sec with no AD vs. LRAD in order to improved community mobility.    Baseline 14.07 seconds with SPC = 2.33 ft/sec on 09/01/20    Time 4    Period Weeks    Status Revised      PT LONG TERM GOAL #4   Title Pt will perform 230' of gait on level indoor surfaces while scanning environment with supervision with  no AD vs. LRAD in order  to demo decr fall risk.    Baseline min guard when using cane, min A with no AD.    Time 4    Period Weeks    Status New      PT LONG TERM GOAL #5   Title Pt will ambulate at least 300' with no AD vs. LRAD outdoors over unlevel surfaces and perform a curb with supervision in order to demo improved community mobility.    Baseline not yet assessed.    Time 4    Period Weeks    Status New                 Plan - 09/15/20 0818    Clinical Impression Statement Due to pt's recent fall and c/o's of increased pain in buttock/hip notified PT available at this time. Pt was assessed by this PT Cherly Anderson, PT, DPT) with no significant issues found. Pt was advised to use ice for swelling and heat to relax tight muscles due to recent fall. Remainder of session focused on balance training on compliant surfaces and dynamic gait with use of cane today due to increased pain. Pt reported "mild" dizziness with head movements with gait, able to continue with session. The pt is progressing and should benefit from continued PT to progress toward unmet goals.    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 stroke)    Examination-Activity Limitations Stand;Transfers;Locomotion Level;Stairs    Examination-Participation Restrictions Cleaning;Community Activity;Laundry   cooking   Stability/Clinical Decision Making Stable/Uncomplicated    Rehab Potential Good    PT Frequency 2x / week   followed by 1x week for 2 weeks   PT Duration 2 weeks   followed by 1x week for 2 weeks   PT Treatment/Interventions ADLs/Self Care Home Management;DME Instruction;Gait training;Stair training;Functional mobility training;Therapeutic  activities;Patient/family education;Therapeutic exercise;Balance training;Neuromuscular re-education;Passive range of motion;Orthotic Fit/Training;Vestibular    PT Next Visit Plan vestibular screen/assessment (pt mainly has dizziness with head motions during gait, scrolling up/down on her phone). gait with no AD. SLS balance. corner balance on compliant surfaces with vision removed. gait/balance with head motions.    PT Home Exercise Plan Access Code: [email or web address] and Agree with Plan of Care Patient           Patient will benefit from skilled therapeutic intervention in order to improve the following deficits and impairments:  Abnormal gait,Decreased activity tolerance,Decreased balance,Decreased endurance,Decreased coordination,Decreased strength,Difficulty walking,Dizziness  Visit Diagnosis: Other lack of coordination  Muscle weakness (generalized)  Other symptoms and signs involving the nervous system  Unsteadiness on feet     Problem List Patient Active Problem List   Diagnosis Date Noted  . Depression 07/05/2020  . CVA (cerebral vascular accident) ([place]) 06/21/2020  . CVA (cerebrovascular accident) ([place]) 06/19/2020  . Abnormality of gait 10/14/2018  . Dizziness and giddiness 09/13/2018  . Debility 06/26/2018  . Chronic nonintractable headache 04/26/2018  . Diastolic dysfunction   . Congestion of nasal sinus   . Pneumonia of left lower lobe due to infectious organism   . SOB (shortness of breath)   . Candidiasis   . CKD (chronic kidney disease) stage 4, GFR 15-29 ml/min (HCC)   . Acute on chronic anemia   . Hypoalbuminemia due to protein-calorie malnutrition ([place])   .  Essential hypertension   . Non-insulin dependent type 2 diabetes mellitus ([place])   . PRES (posterior reversible encephalopathy syndrome)   . Hypokalemia   . History of CVA with residual deficit   . History of seizure 03/25/2018  . Bilateral carpal tunnel syndrome 02/14/2018  . Arthritis  10/31/2017  . Asthma 10/31/2017  . Constipation 10/31/2017  . Kidney stone 10/31/2017  . Anemia of chronic disease 08/09/2017  . Hyperlipidemia 07/26/2017  . 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[place]) 07/25/2017  . Anxiety as acute reaction to exceptional stress 04/23/2017  . Insomnia 10/08/2016  . Neuropathy 06/07/2016  . Gait instability   . Lesion of pons   . Slurred speech   . Dizziness 04/30/2016  . Hyperglycemia 04/30/2016  . Chronic kidney disease (CKD), stage III (moderate) ([place]) 04/30/2016  . Ataxia 04/30/2016  . Chronic right shoulder pain 04/03/2016  . 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 Rhinitis, allergic 08/25/2015  . Asthma, mild intermittent 07/14/2015  . Gastroesophageal reflux disease without esophagitis 06/08/2015  . Hypertensive cardiovascular disease 05/05/2015  . Vitamin D deficiency 05/05/2015    Willow Ora, PTA, [facility] [address], [place], [place] [address] [phone] 09/15/20, 9:21 PM   Name: Christina Orozco MRN: [id number] Date of Birth: 1971-02-13

## 2020-09-22 ENCOUNTER — Ambulatory Visit: Payer: Medicaid Other

## 2020-09-22 ENCOUNTER — Other Ambulatory Visit: Payer: Self-pay

## 2020-09-22 DIAGNOSIS — M6281 Muscle weakness (generalized): Secondary | ICD-10-CM

## 2020-09-22 DIAGNOSIS — R42 Dizziness and giddiness: Secondary | ICD-10-CM

## 2020-09-22 DIAGNOSIS — R278 Other lack of coordination: Secondary | ICD-10-CM | POA: Diagnosis not present

## 2020-09-22 DIAGNOSIS — R2689 Other abnormalities of gait and mobility: Secondary | ICD-10-CM

## 2020-09-22 DIAGNOSIS — R2681 Unsteadiness on feet: Secondary | ICD-10-CM

## 2020-09-22 NOTE — Therapy (Signed)
[facility] [address] [place], [place], [address] Phone: [phone]   Fax:  [phone]  Physical Therapy Treatment  Patient Details  Name: Christina Orozco MRN: [id number] Date of Birth: 08-04-1970 Referring Provider (PT): Dr. Francine Graven   Encounter Date: 09/22/2020   PT End of Session - 09/22/20 0936    Visit Number 10    Number of Visits 12    Authorization Type BCBS Medicaid - [place] for 7 PT visits 07/19/20 - 08/30/20 - requesting new auth on 09/01/20    Authorization - Visit Number 4   used so far in calendar year 2022   Authorization - Number of Visits 6    PT Start Time 0933    PT Stop Time 1015    PT Time Calculation (min) 42 min    Equipment Utilized During Treatment Gait belt    Activity Tolerance Patient tolerated treatment well    Behavior During Therapy [facility] for tasks assessed/performed           Past Medical History:  Diagnosis Date  . Anemia    severe  . Asthma   . CKD (chronic kidney disease)    stage IV  . DDD (degenerative disc disease), cervical   . Diabetes mellitus ([place])   . Dislocated shoulder    right  . GERD (gastroesophageal reflux disease)   . Hypertension   . Neuropathy   . Stroke [facility])     Past Surgical History:  Procedure Laterality Date  . ADENOIDECTOMY    . APPENDECTOMY    . CERVICAL SPINE SURGERY  06/2012   C5-C6  . EYE SURGERY     left eye surgery   . FOOT SURGERY    . LOOP RECORDER INSERTION N/A 08/27/2017   Procedure: LOOP RECORDER INSERTION;  Surgeon: Sanda Klein, MD;  Location: [facility];  Service: Cardiovascular;  Laterality: N/A;  . SHOULDER SURGERY    . TEE WITHOUT CARDIOVERSION N/A 08/27/2017   Procedure: TRANSESOPHAGEAL ECHOCARDIOGRAM (TEE);  Surgeon: Sanda Klein, MD;  Location: [facility] ENDOSCOPY;  Service: Cardiovascular;  Laterality: N/A;  . TONSILLECTOMY      There were no vitals filed for this visit.   Subjective Assessment - 09/22/20  0937    Subjective Patient reports pain in the left buttocks area is getting better. Couple stumbles since the last visit, but no falls.    Pertinent History DM, seizures 2019, HTN, GERD, CKD IV, asthma, neuropathy, anemia, CVA R thalamus 2017    Limitations Walking    Patient Stated Goals wants to be more stable when she walks, improve balance, go back to using no AD.    Currently in Pain? No/denies    Pain Onset In the past 7 days            Vestibular Assessment - 09/22/20 0001      Symptom Behavior   Subjective history of current problem Patient reports spontaneous dizziness, described it as spinning sensation. Patient reports that she has been having frequent headahces, approx daily. No specific movements reported that stirs the dizziness of, just generally with physical activity    Type of Dizziness  Spinning;Imbalance;Diplopia;Lightheadedness    Frequency of Dizziness 3-4 episodes of dizziness    Duration of Dizziness minutes    Symptom Nature Spontaneous;Motion provoked    Aggravating Factors Activity in general;Turning head quickly    Relieving Factors Slow movements;Rest    History of similar episodes None      Oculomotor Exam  Oculomotor Alignment Normal    Ocular ROM WNL    Spontaneous Absent    Gaze-induced  Absent    Smooth Pursuits Saccades   intermittent saccades noted;   Saccades Poor trajectory;Comment   reports mild nauseous and mild dizziness with completion     Oculomotor Exam-Fixation Suppressed    Left Head Impulse Negative    Right Head Impulse Positive      Vestibulo-Ocular Reflex   VOR 1 Head Only (x 1 viewing) difficult maintaining focus with completion.    VOR Cancellation Corrective saccades   Mild Dizziness     Visual Acuity   Static 6    Dynamic unable to finish any line with completion   mild dizziness     Other Tests   Comments VBI Screen: Negative      Positional Testing   Dix-Hallpike Dix-Hallpike Right;Dix-Hallpike Left     Sidelying Test Sidelying Right;Sidelying Left    Horizontal Canal Testing Horizontal Canal Right;Horizontal Canal Left      Dix-Hallpike Right   Dix-Hallpike Right Duration > 60 seconds   non fatiguing   Dix-Hallpike Right Symptoms Downbeat Nystagmus      Dix-Hallpike Left   Dix-Hallpike Left Duration 0    Dix-Hallpike Left Symptoms No nystagmus      Sidelying Right   Sidelying Right Duration 0    Sidelying Right Symptoms No nystagmus      Sidelying Left   Sidelying Left Duration 0    Sidelying Left Symptoms No nystagmus      Horizontal Canal Right   Horizontal Canal Right Duration 0    Horizontal Canal Right Symptoms Normal      Horizontal Canal Left   Horizontal Canal Left Duration 0    Horizontal Canal Left Symptoms Normal      Positional Sensitivities   Right Hallpike Mild dizziness    Up from Right Hallpike Mild dizziness    Up from Left Hallpike No dizziness    Nose to Right Knee Mild dizziness   sensation of falling over   Right Knee to Sitting No dizziness    Nose to Left Knee No dizziness    Left Knee to Sitting No dizziness    Head Turning x 5 Mild dizziness   increased headache after completion   Head Nodding x 5 Moderate dizziness   increased neck tension reported, patient having to take rest break between completion             PT Short Term Goals - 09/01/20 0906      PT SHORT TERM GOAL #1   Title ALL STGS = LTGS             PT Long Term Goals - 09/01/20 0906      PT LONG TERM GOAL #1   Title Pt will be independent with final HEP in order to build upon functional gains made in therapy. ALL LTGS DUE 09/29/20    Baseline will need to review HEP and update as appropriate.    Time 4    Period Weeks    Status On-going    Target Date 09/29/20      PT LONG TERM GOAL #2   Title Pt will improve DGI to at least a 17/24 in order to demo decr fall risk.    Baseline 12/24    Time 4    Period Weeks    Status New      PT LONG TERM GOAL #3   Title  Pt  will improve gait speed to at least 2.65 ft/sec with no AD vs. LRAD in order to improved community mobility.    Baseline 14.07 seconds with SPC = 2.33 ft/sec on 09/01/20    Time 4    Period Weeks    Status Revised      PT LONG TERM GOAL #4   Title Pt will perform 230' of gait on level indoor surfaces while scanning environment with supervision with no AD vs. LRAD in order to demo decr fall risk.    Baseline min guard when using cane, min A with no AD.    Time 4    Period Weeks    Status New      PT LONG TERM GOAL #5   Title Pt will ambulate at least 300' with no AD vs. LRAD outdoors over unlevel surfaces and perform a curb with supervision in order to demo improved community mobility.    Baseline not yet assessed.    Time 4    Period Weeks    Status New                 Plan - 09/22/20 1210    Clinical Impression Statement Today's skilled PT session included further vestibular assesment. Patient demonstrating abnormal eye movements with smooth pursuits and saccades, mild dizziness with completion. Patient demonstrating positive R head impulse and inability to complete DVA indicating VOR deficits. Patient did have pure Upbeating Nystagmus (no rotary component) non fatiguing with R [employer].Patient also demo increased motion sensitivity. Patient demonstrating multifactorial dizziness due to recent CVA. Will continue to progress toward all LTGs.    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 stroke)    Examination-Activity Limitations Stand;Transfers;Locomotion Level;Stairs    Examination-Participation Restrictions Cleaning;Community Activity;Laundry   cooking   Stability/Clinical Decision Making  Stable/Uncomplicated    Rehab Potential Good    PT Frequency 2x / week   followed by 1x week for 2 weeks   PT Duration 2 weeks   followed by 1x week for 2 weeks   PT Treatment/Interventions ADLs/Self Care Home Management;DME Instruction;Gait training;Stair training;Functional mobility training;Therapeutic activities;Patient/family education;Therapeutic exercise;Balance training;Neuromuscular re-education;Passive range of motion;Orthotic Fit/Training;Vestibular    PT Next Visit Plan Initiate VOR (slowly due to patient decreased tolerance). Balance with head turns. gait with no AD. SLS balance. corner balance on compliant surfaces with vision removed. gait/balance with head motions.    PT Home Exercise Plan Access Code: [email or web address] and Agree with Plan of Care Patient           Patient will benefit from skilled therapeutic intervention in order to improve the following deficits and impairments:  Abnormal gait,Decreased activity tolerance,Decreased balance,Decreased endurance,Decreased coordination,Decreased strength,Difficulty walking,Dizziness  Visit Diagnosis: Muscle weakness (generalized)  Unsteadiness on feet  Other abnormalities of gait and mobility  Dizziness and giddiness     Problem List Patient Active Problem List   Diagnosis Date Noted  . Depression 07/05/2020  . CVA (cerebral vascular accident) ([place]) 06/21/2020  . CVA (cerebrovascular accident) ([place]) 06/19/2020  . Abnormality of gait 10/14/2018  . Dizziness and giddiness 09/13/2018  . Debility 06/26/2018  . Chronic nonintractable headache 04/26/2018  . Diastolic dysfunction   . Congestion of nasal sinus   . Pneumonia of left lower lobe due  to infectious organism   . SOB (shortness of breath)   . Candidiasis   . CKD (chronic kidney disease) stage 4, GFR 15-29 ml/min ([facility]CC)   . Acute on chronic anemia   . Hypoalbuminemia due to protein-calorie malnutrition ([place])   . Essential hypertension   . Non-insulin  dependent type 2 diabetes mellitus ([place])   . PRES (posterior reversible encephalopathy syndrome)   . Hypokalemia   . History of CVA with residual deficit   . History of seizure 03/25/2018  . Bilateral carpal tunnel syndrome 02/14/2018  . Arthritis 10/31/2017  . Asthma 10/31/2017  . Constipation 10/31/2017  . Kidney stone 10/31/2017  . Anemia of chronic disease 08/09/2017  . Hyperlipidemia 07/26/2017  . 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[place]) 07/25/2017  . Anxiety as acute reaction to exceptional stress 04/23/2017  . Insomnia 10/08/2016  . Neuropathy 06/07/2016  . Gait instability   . Lesion of pons   . Slurred speech   . Dizziness 04/30/2016  . Hyperglycemia 04/30/2016  . Chronic kidney disease (CKD), stage III (moderate) ([place]) 04/30/2016  . Ataxia 04/30/2016  . Chronic right shoulder pain 04/03/2016  . 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 Rhinitis, allergic 08/25/2015  . Asthma, mild intermittent 07/14/2015  . Gastroesophageal reflux disease without esophagitis 06/08/2015  . Hypertensive cardiovascular disease 05/05/2015  . Vitamin D deficiency 05/05/2015    Jones Bales, PT, DPT 09/22/2020, 12:19 PM  [facility] [address] [place], [place], [address] Phone: [phone]   Fax:  [phone]  Name: Christina Orozco MRN: [id number] Date of Birth: [birth year]

## 2020-10-01 ENCOUNTER — Other Ambulatory Visit: Payer: Self-pay

## 2020-10-01 ENCOUNTER — Ambulatory Visit: Payer: Medicaid Other | Admitting: Physical Therapy

## 2020-10-01 ENCOUNTER — Encounter: Payer: Self-pay | Admitting: Physical Therapy

## 2020-10-01 DIAGNOSIS — R2689 Other abnormalities of gait and mobility: Secondary | ICD-10-CM

## 2020-10-01 DIAGNOSIS — M6281 Muscle weakness (generalized): Secondary | ICD-10-CM

## 2020-10-01 DIAGNOSIS — R42 Dizziness and giddiness: Secondary | ICD-10-CM

## 2020-10-01 DIAGNOSIS — R278 Other lack of coordination: Secondary | ICD-10-CM | POA: Diagnosis not present

## 2020-10-01 DIAGNOSIS — R2681 Unsteadiness on feet: Secondary | ICD-10-CM

## 2020-10-01 NOTE — Therapy (Signed)
[facility] [address] [place], [place], [address] Phone: [phone]   Fax:  [phone]  Physical Therapy Treatment  Patient Details  Name: Christina Orozco MRN: [id number] Date of Birth: 09-09-1970 Referring Provider (PT): Dr. Francine Graven   Encounter Date: 10/01/2020   PT End of Session - 10/01/20 0943    Visit Number 11    Number of Visits 12    Authorization Type [employer] Medicaid - [place] for 7 PT visits 07/19/20 - 08/30/20 - requesting new auth on 09/01/20    Authorization - Visit Number 5    Authorization - Number of Visits 6    PT Start Time 0807   pt late   PT Stop Time 0847    PT Time Calculation (min) 40 min    Equipment Utilized During Treatment Gait belt    Activity Tolerance Patient tolerated treatment well    Behavior During Therapy [facility] for tasks assessed/performed           Past Medical History:  Diagnosis Date  . Anemia    severe  . Asthma   . CKD (chronic kidney disease)    stage IV  . DDD (degenerative disc disease), cervical   . Diabetes mellitus ([place])   . Dislocated shoulder    right  . GERD (gastroesophageal reflux disease)   . Hypertension   . Neuropathy   . Stroke [facility])     Past Surgical History:  Procedure Laterality Date  . ADENOIDECTOMY    . APPENDECTOMY    . CERVICAL SPINE SURGERY  06/2012   C5-C6  . EYE SURGERY     left eye surgery   . FOOT SURGERY    . LOOP RECORDER INSERTION N/A 08/27/2017   Procedure: LOOP RECORDER INSERTION;  Surgeon: Sanda Klein, MD;  Location: [facility];  Service: Cardiovascular;  Laterality: N/A;  . SHOULDER SURGERY    . TEE WITHOUT CARDIOVERSION N/A 08/27/2017   Procedure: TRANSESOPHAGEAL ECHOCARDIOGRAM (TEE);  Surgeon: Sanda Klein, MD;  Location: [facility] ENDOSCOPY;  Service: Cardiovascular;  Laterality: N/A;  . TONSILLECTOMY      There were no vitals filed for this visit.   Subjective Assessment - 10/01/20 0810    Subjective Had a  fall last thursday - unsure of what happened. Might be moving into an apartment with 4 steps, wondering if she could do them. Dizziness is still the same.    Pertinent History DM, seizures 2019, HTN, GERD, CKD IV, asthma, neuropathy, anemia, CVA R thalamus 2017    Limitations Walking    Patient Stated Goals wants to be more stable when she walks, improve balance, go back to using no AD.    Currently in Pain? No/denies    Pain Onset In the past 7 days                             [facility] Adult PT Treatment/Exercise - 10/01/20 0830      Ambulation/Gait   Stairs Yes    Stairs Assistance 5: Supervision    Stairs Assistance Details (indicate cue type and reason) cane in RUE and holding railing on L, cues for proper sequencing and up with good/stronger L leg and down with weaker RLE    Stair Management Technique One rail Left;Step to pattern;With cane    Number of Stairs 4   x2   Height of Stairs 6  Vestibular Treatment/Exercise - 10/01/20 [id number]      Vestibular Treatment/Exercise   Gaze Exercises X1 Viewing Horizontal;X1 Viewing Vertical      X1 Viewing Horizontal   Foot Position seated with back support, feet on floor    Time --   30 seconds or 10 reps   Reps 3    Comments 4-5/10 dizziness, performed slowly with dizziness subsiding with rest break. added to HEP      X1 Viewing Vertical   Foot Position seated with back support, feet on floor    Reps 3    Comments 1st rep slowly for 30 seconds; pt rating 7/10 dizziness, 2nd rep performed 5 reps with 5/10 dizziness, dizziness subsided with rest break. added to HEP, cued to not let dizziness get above a 5/10              Balance Exercises - 10/01/20 0001      Balance Exercises: Standing   Standing Eyes Opened Wide (BOA);Foam/compliant surface    Standing Eyes Opened Limitations on single pillow; x10 reps head turns, x10 reps head nods (incr dizziness with head nods). needing standing rest break between  each    Marching Foam/compliant surface;Intermittent upper extremity assist;Limitations    Marching Limitations standing on single pillow, alternating slow marching 2 x5 reps with min A with no UE support             PT Education - 10/01/20 0943    Education Details seated VOR to HEP, discussed potential speech therapy eval due to word finding/memory - pt to speak with her neurologist about this with visit next week.    Person(s) Educated Patient    Methods Explanation;Demonstration;Verbal cues;Handout    Comprehension Verbalized understanding;Returned demonstration            PT Short Term Goals - 09/01/20 0906      PT SHORT TERM GOAL #1   Title ALL STGS = LTGS             PT Long Term Goals - 09/01/20 0906      PT LONG TERM GOAL #1   Title Pt will be independent with final HEP in order to build upon functional gains made in therapy. ALL LTGS DUE 09/29/20    Baseline will need to review HEP and update as appropriate.    Time 4    Period Weeks    Status On-going    Target Date 09/29/20      PT LONG TERM GOAL #2   Title Pt will improve DGI to at least a 17/24 in order to demo decr fall risk.    Baseline 12/24    Time 4    Period Weeks    Status New      PT LONG TERM GOAL #3   Title Pt will improve gait speed to at least 2.65 ft/sec with no AD vs. LRAD in order to improved community mobility.    Baseline 14.07 seconds with SPC = 2.33 ft/sec on 09/01/20    Time 4    Period Weeks    Status Revised      PT LONG TERM GOAL #4   Title Pt will perform 230' of gait on level indoor surfaces while scanning environment with supervision with no AD vs. LRAD in order to demo decr fall risk.    Baseline min guard when using cane, min A with no AD.    Time 4    Period Weeks    Status New  PT LONG TERM GOAL #5   Title Pt will ambulate at least 300' with no AD vs. LRAD outdoors over unlevel surfaces and perform a curb with supervision in order to demo improved community  mobility.    Baseline not yet assessed.    Time 4    Period Weeks    Status New                 Plan - 10/01/20 0946    Clinical Impression Statement Pt reports that she might be moving into an apartment with 4 steps (unsure if railing or not). Practiced stair training with use of single railing and cane with cues for sequencing and step to pattern, pt able to perform with supervision. Pt reporting that it caused her incr in knee pain and not sure that she could tolerate stairs. Initiated slow seated VOR x1 exercises (horizontal and vertical today) for HEP. Pt with incr difficulty in vertical direction. Will continue to progress towards LTGs.    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 stroke)    Examination-Activity Limitations Stand;Transfers;Locomotion Level;Stairs    Examination-Participation Restrictions Cleaning;Community Activity;Laundry   cooking   Stability/Clinical Decision Making Stable/Uncomplicated    Rehab Potential Good    PT Frequency 2x / week   followed by 1x week for 2 weeks   PT Duration 2 weeks   followed by 1x week for 2 weeks   PT Treatment/Interventions ADLs/Self Care Home Management;DME Instruction;Gait training;Stair training;Functional mobility training;Therapeutic activities;Patient/family education;Therapeutic exercise;Balance training;Neuromuscular re-education;Passive range of motion;Orthotic Fit/Training;Vestibular    PT Next Visit Plan last visit - need to check goals and re-cert. how is VOR exercises for home? Balance with head turns. gait with no AD. SLS balance. corner balance on compliant surfaces with vision removed.    PT Home Exercise Plan Access Code: [email or web address] and  Agree with Plan of Care Patient           Patient will benefit from skilled therapeutic intervention in order to improve the following deficits and impairments:  Abnormal gait,Decreased activity tolerance,Decreased balance,Decreased endurance,Decreased coordination,Decreased strength,Difficulty walking,Dizziness  Visit Diagnosis: Muscle weakness (generalized)  Unsteadiness on feet  Other abnormalities of gait and mobility  Dizziness and giddiness     Problem List Patient Active Problem List   Diagnosis Date Noted  . Depression 07/05/2020  . CVA (cerebral vascular accident) ([place]) 06/21/2020  . CVA (cerebrovascular accident) ([place]) 06/19/2020  . Abnormality of gait 10/14/2018  . Dizziness and giddiness 09/13/2018  . Debility 06/26/2018  . Chronic nonintractable headache 04/26/2018  . Diastolic dysfunction   . Congestion of nasal sinus   . Pneumonia of left lower lobe due to infectious organism   . SOB (shortness of breath)   . Candidiasis   . CKD (chronic kidney disease) stage 4, GFR 15-29 ml/min (H[facility])   . Acute on chronic anemia   . Hypoalbuminemia due to protein-calorie malnutrition ([place])   . Essential hypertension   . Non-insulin dependent type 2 diabetes mellitus ([place])   . PRES (posterior reversible encephalopathy syndrome)   . Hypokalemia   . History of CVA with residual deficit   . History of seizure 03/25/2018  . Bilateral carpal tunnel syndrome 02/14/2018  . Arthritis 10/31/2017  . Asthma 10/31/2017  . Constipation  10/31/2017  . Kidney stone 10/31/2017  . Anemia of chronic disease 08/09/2017  . Hyperlipidemia 07/26/2017  . 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[place]) 07/25/2017  . Anxiety as acute reaction to exceptional stress 04/23/2017  . Insomnia 10/08/2016  . Neuropathy 06/07/2016  . Gait instability   . Lesion of pons   . Slurred speech   . Dizziness 04/30/2016  . Hyperglycemia 04/30/2016  . Chronic kidney disease (CKD), stage III (moderate) ([place]) 04/30/2016  . Ataxia  04/30/2016  . Chronic right shoulder pain 04/03/2016  . 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 Rhinitis, allergic 08/25/2015  . Asthma, mild intermittent 07/14/2015  . Gastroesophageal reflux disease without esophagitis 06/08/2015  . Hypertensive cardiovascular disease 05/05/2015  . Vitamin D deficiency 05/05/2015    Arliss Journey, PT, DPT  10/01/2020, 9:50 AM  [facility] [address] [place], [place], [address] Phone: [phone]   Fax:  [phone]  Name: Christina Orozco MRN: [id number] Date of Birth: December 23, 1970

## 2020-10-01 NOTE — Patient Instructions (Signed)
Access Code: [email or web address] URL: [email or web address] Date: 10/01/2020 Prepared by: Janann August  Exercises Sit to Stand with Arms Crossed - 1 x daily - 5 x weekly - 1 sets - 10 reps Heel Toe Raises with Counter Support - 1 x daily - 5 x weekly - 1 sets - 10 reps Standing Near Stance in Corner with Eyes Closed - 1 x daily - 5 x weekly - 1 sets - 3 reps - 20 hold Standing Marching - 1 x daily - 5 x weekly - 3 sets Standing Tandem Balance with Counter Support - 1 x daily - 5 x weekly - 1 sets - 3 reps - 20 hold Backward Walking with Counter Support - 1 x daily - 5 x weekly - 3 sets Standing with Head Rotation - 1 x daily - 5 x weekly - 1-2 sets - 10 reps Seated Vestibular Gaze Fixation with Head Rotation - 1 x daily - 5 x weekly - 2-3 sets - 10 reps Seated Gaze Stabilization with Head Nod - 1 x daily - 5 x weekly - 2-3 sets - 5 reps

## 2020-10-02 ENCOUNTER — Emergency Department (HOSPITAL_COMMUNITY): Payer: Medicaid Other

## 2020-10-02 ENCOUNTER — Encounter (HOSPITAL_COMMUNITY): Payer: Self-pay | Admitting: *Deleted

## 2020-10-02 ENCOUNTER — Emergency Department (HOSPITAL_COMMUNITY)
Admission: EM | Admit: 2020-10-02 | Discharge: 2020-10-02 | Disposition: A | Discharge status: Home / Self Care | Payer: Medicaid Other | Attending: Emergency Medicine | Admitting: Emergency Medicine

## 2020-10-02 ENCOUNTER — Ambulatory Visit (HOSPITAL_COMMUNITY)
Admission: RE | Admit: 2020-10-02 | Discharge: 2020-10-02 | Disposition: A | Discharge status: Home / Self Care | Payer: Medicaid Other | Source: Ambulatory Visit | Attending: Emergency Medicine | Admitting: Emergency Medicine

## 2020-10-02 ENCOUNTER — Other Ambulatory Visit: Payer: Self-pay

## 2020-10-02 ENCOUNTER — Other Ambulatory Visit (HOSPITAL_COMMUNITY): Payer: Medicaid Other

## 2020-10-02 DIAGNOSIS — R531 Weakness: Secondary | ICD-10-CM | POA: Diagnosis not present

## 2020-10-02 DIAGNOSIS — Z7982 Long term (current) use of aspirin: Secondary | ICD-10-CM | POA: Insufficient documentation

## 2020-10-02 DIAGNOSIS — I129 Hypertensive chronic kidney disease with stage 1 through stage 4 chronic kidney disease, or unspecified chronic kidney disease: Secondary | ICD-10-CM | POA: Diagnosis not present

## 2020-10-02 DIAGNOSIS — E1122 Type 2 diabetes mellitus with diabetic chronic kidney disease: Secondary | ICD-10-CM | POA: Insufficient documentation

## 2020-10-02 DIAGNOSIS — J45909 Unspecified asthma, uncomplicated: Secondary | ICD-10-CM | POA: Insufficient documentation

## 2020-10-02 DIAGNOSIS — H538 Other visual disturbances: Secondary | ICD-10-CM | POA: Insufficient documentation

## 2020-10-02 DIAGNOSIS — Z7902 Long term (current) use of antithrombotics/antiplatelets: Secondary | ICD-10-CM | POA: Diagnosis not present

## 2020-10-02 DIAGNOSIS — H5461 Unqualified visual loss, right eye, normal vision left eye: Secondary | ICD-10-CM | POA: Diagnosis present

## 2020-10-02 DIAGNOSIS — N184 Chronic kidney disease, stage 4 (severe): Secondary | ICD-10-CM | POA: Insufficient documentation

## 2020-10-02 DIAGNOSIS — Z79899 Other long term (current) drug therapy: Secondary | ICD-10-CM | POA: Diagnosis not present

## 2020-10-02 LAB — CBC WITH DIFFERENTIAL/PLATELET
Abs Immature Granulocytes: 0.01 10*3/uL (ref 0.00–0.07)
Basophils Absolute: 0 10*3/uL (ref 0.0–0.1)
Basophils Relative: 1 %
Eosinophils Absolute: 0.1 10*3/uL (ref 0.0–0.5)
Eosinophils Relative: 1 %
HCT: 27.3 % — ABNORMAL LOW (ref 36.0–46.0)
Hemoglobin: 9.1 g/dL — ABNORMAL LOW (ref 12.0–15.0)
Immature Granulocytes: 0 %
Lymphocytes Relative: 50 %
Lymphs Abs: 2.9 10*3/uL (ref 0.7–4.0)
MCH: 29.2 pg (ref 26.0–34.0)
MCHC: 33.3 g/dL (ref 30.0–36.0)
MCV: 87.5 fL (ref 80.0–100.0)
Monocytes Absolute: 0.3 10*3/uL (ref 0.1–1.0)
Monocytes Relative: 5 %
Neutro Abs: 2.5 10*3/uL (ref 1.7–7.7)
Neutrophils Relative %: 43 %
Platelets: 253 10*3/uL (ref 150–400)
RBC: 3.12 MIL/uL — ABNORMAL LOW (ref 3.87–5.11)
RDW: 13.2 % (ref 11.5–15.5)
WBC: 5.9 10*3/uL (ref 4.0–10.5)
nRBC: 0 % (ref 0.0–0.2)

## 2020-10-02 LAB — COMPREHENSIVE METABOLIC PANEL
ALT: 12 U/L (ref 0–44)
AST: 17 U/L (ref 15–41)
Albumin: 3.9 g/dL (ref 3.5–5.0)
Alkaline Phosphatase: 43 U/L (ref 38–126)
Anion gap: 8 (ref 5–15)
BUN: 37 mg/dL — ABNORMAL HIGH (ref 6–20)
CO2: 23 mmol/L (ref 22–32)
Calcium: 9.1 mg/dL (ref 8.9–10.3)
Chloride: 112 mmol/L — ABNORMAL HIGH (ref 98–111)
Creatinine, Ser: 2.2 mg/dL — ABNORMAL HIGH (ref 0.44–1.00)
GFR, Estimated: 27 mL/min — ABNORMAL LOW (ref >60)
Glucose, Bld: 97 mg/dL (ref 70–99)
Potassium: 3.6 mmol/L (ref 3.5–5.1)
Sodium: 143 mmol/L (ref 135–145)
Total Bilirubin: 0.2 mg/dL — ABNORMAL LOW (ref 0.3–1.2)
Total Protein: 7.3 g/dL (ref 6.5–8.1)

## 2020-10-02 LAB — HCG, QUANTITATIVE, PREGNANCY: hCG, Beta Chain, Quant, S: 3 m[IU]/mL (ref <5)

## 2020-10-02 LAB — CBG MONITORING, ED: Glucose-Capillary: 108 mg/dL — ABNORMAL HIGH (ref 70–99)

## 2020-10-02 MED ORDER — LORAZEPAM 2 MG/ML IJ SOLN
0.5000 mg | Freq: Once | INTRAMUSCULAR | Status: AC | PRN
  Administered 2020-10-02: 0.5 mg via INTRAVENOUS
  Filled 2020-10-02: qty 1

## 2020-10-02 MED ORDER — FLUORESCEIN SODIUM 1 MG OP STRP
1.0000 | ORAL_STRIP | Freq: Once | OPHTHALMIC | Status: AC
  Administered 2020-10-02: 1 via OPHTHALMIC
  Filled 2020-10-02: qty 1

## 2020-10-02 MED ORDER — GADOBUTROL 1 MMOL/ML IV SOLN
6.0000 mL | Freq: Once | INTRAVENOUS | Status: AC | PRN
  Administered 2020-10-02: 6 mL via INTRAVENOUS

## 2020-10-02 MED ORDER — PROCHLORPERAZINE EDISYLATE 10 MG/2ML IJ SOLN
10.0000 mg | Freq: Once | INTRAMUSCULAR | Status: AC
  Administered 2020-10-02: 10 mg via INTRAVENOUS
  Filled 2020-10-02: qty 2

## 2020-10-02 MED ORDER — TETRACAINE HCL 0.5 % OP SOLN
2.0000 [drp] | Freq: Once | OPHTHALMIC | Status: AC
  Administered 2020-10-02: 2 [drp] via OPHTHALMIC
  Filled 2020-10-02: qty 4

## 2020-10-02 MED ORDER — DIPHENHYDRAMINE HCL 50 MG/ML IJ SOLN
25.0000 mg | Freq: Once | INTRAMUSCULAR | Status: AC
  Administered 2020-10-02: 25 mg via INTRAVENOUS
  Filled 2020-10-02: qty 1

## 2020-10-02 NOTE — ED Notes (Signed)
Unable to get patient vitals she is in MRI and has been gone since 17:38

## 2020-10-02 NOTE — ED Triage Notes (Signed)
Pt reports loss of vision in her right eye since waking up this morning. Pt can see light and colors but cannot make out object. History of stroke. She has limited vision in her left eye. She reports her eye was burning yesterday. Also reports headache today.

## 2020-10-02 NOTE — ED Provider Notes (Signed)
[facility] DEPT Provider Note   CSN: [id number] Arrival date & time: 10/02/20  1350     History Chief Complaint  Patient presents with  . Decreased Visual Acuity    Christina Orozco is a 50 y.o. female.  HPI      49 year old female with a history of multiple CVAs, hypertension, diabetes, CKD, seizure, vision loss in left eye (she reports history of cataract surgery and history of loss of blood flow/seeing retinal specialist previously but does not currently have Ophthalmologist due to insurance) who presents with concern for acute onset right vision loss.  Reports that she began to have some burning of her right eye last night, and when she woke up at 3 in the morning to check on her grandson, she was unable to see normally out of the right eye, and that the symptoms continued when she woke up this morning.  Reports she has chronic right-sided weakness from her prior CVA, but this is not changed today.  Reports that she has pain around and behind her eye, describes a burning.  Describes her vision changes as looking through a film, as if everything is blurry and cloudy.  She is able to see colors, shapes, movement.  She denies double vision or missing pieces of vision.   Denies getting anything into her eyes or other concerns.  Denies fevers, cough, congestion.  Denies any other new neurologic symptoms, including no new difficulty walking, no new numbness or weakness, no difficulty talking.  Past Medical History:  Diagnosis Date  . Anemia    severe  . Asthma   . CKD (chronic kidney disease)    stage IV  . DDD (degenerative disc disease), cervical   . Diabetes mellitus ([place])   . Dislocated shoulder    right  . GERD (gastroesophageal reflux disease)   . Hypertension   . Neuropathy   . Stroke [facility])     Patient Active Problem List   Diagnosis Date Noted  . Depression 07/05/2020  . CVA (cerebral vascular accident) ([place]) 06/21/2020  . CVA  (cerebrovascular accident) ([place]) 06/19/2020  . Abnormality of gait 10/14/2018  . Dizziness and giddiness 09/13/2018  . Debility 06/26/2018  . Chronic nonintractable headache 04/26/2018  . Diastolic dysfunction   . Congestion of nasal sinus   . Pneumonia of left lower lobe due to infectious organism   . SOB (shortness of breath)   . Candidiasis   . CKD (chronic kidney disease) stage 4, GFR 15-29 ml/min (HCC)   . Acute on chronic anemia   . Hypoalbuminemia due to protein-calorie malnutrition ([place])   . Essential hypertension   . Non-insulin dependent type 2 diabetes mellitus ([place])   . PRES (posterior reversible encephalopathy syndrome)   . Hypokalemia   . History of CVA with residual deficit   . History of seizure 03/25/2018  . Bilateral carpal tunnel syndrome 02/14/2018  . Arthritis 10/31/2017  . Asthma 10/31/2017  . Constipation 10/31/2017  . Kidney stone 10/31/2017  . Anemia of chronic disease 08/09/2017  . Hyperlipidemia 07/26/2017  . 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[place]) 07/25/2017  . Anxiety as acute reaction to exceptional stress 04/23/2017  . Insomnia 10/08/2016  . Neuropathy 06/07/2016  . Gait instability   . Lesion of pons   . Slurred speech   . Dizziness 04/30/2016  . Hyperglycemia 04/30/2016  . Chronic kidney disease (CKD), stage III (moderate) ([place]) 04/30/2016  . Ataxia 04/30/2016  . Chronic right shoulder pain 04/03/2016  . 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 Rhinitis, allergic 08/25/2015  . Asthma, mild intermittent 07/14/2015  . Gastroesophageal reflux disease without esophagitis 06/08/2015  . Hypertensive cardiovascular disease 05/05/2015  . Vitamin D deficiency 05/05/2015    Past Surgical History:  Procedure Laterality Date  . ADENOIDECTOMY    . APPENDECTOMY    . CERVICAL SPINE SURGERY  06/2012   C5-C6  . EYE SURGERY     left eye surgery   . FOOT SURGERY    . LOOP RECORDER INSERTION N/A 08/27/2017   Procedure: LOOP RECORDER INSERTION;  Surgeon:  Sanda Klein, MD;  Location: [facility];  Service: Cardiovascular;  Laterality: N/A;  . SHOULDER SURGERY    . TEE WITHOUT CARDIOVERSION N/A 08/27/2017   Procedure: TRANSESOPHAGEAL ECHOCARDIOGRAM (TEE);  Surgeon: Sanda Klein, MD;  Location: [facility] ENDOSCOPY;  Service: Cardiovascular;  Laterality: N/A;  . TONSILLECTOMY       OB History   No obstetric history on file.     Family History  Problem Relation Age of Onset  . Vascular Disease Mother   . CAD Mother   . Heart failure Mother   . Heart disease Other   . Cancer Other        colon, stomach, pancreatic, lung  . Diabetes Other   . Breast cancer Sister 41  . Seizures Maternal Uncle     Social History   Tobacco Use  . Smoking status: Never Smoker  . Smokeless tobacco: Never Used  Vaping Use  . Vaping Use: Never used  Substance Use Topics  . Alcohol use: No  . Drug use: No    Home Medications Prior to Admission medications   Medication Sig Start Date End Date Taking? Authorizing Provider  albuterol (PROAIR HFA) 108 (90 Base) MCG/ACT inhaler INHALE 1 OR 2 PUFFS INTO THE LUNGS EVERY 6 HOURS AS NEEDED FOR WHEEZING OR SHORTNESS OF BREATH Patient taking differently: Inhale 1-2 puffs into the lungs every 6 (six) hours as needed for shortness of breath or wheezing. 04/12/18  Yes Angiulli, Lavon Paganini, PA-C  amLODipine (NORVASC) 10 MG tablet TAKE 1 TABLET BY MOUTH EVERY DAY Patient taking differently: Take 10 mg by mouth daily. 09/08/19  Yes Kilroy, Doreene Burke, PA-C  aspirin 81 MG EC tablet Take 81 mg by mouth daily.   Yes [provider]  atorvastatin (LIPITOR) 80 MG tablet Take 80 mg by mouth at bedtime. 02/25/20  Yes [provider]  Cholecalciferol (VITAMIN D3 PO) Take 2,000 mg by mouth daily.   Yes [provider]  clopidogrel (PLAVIX) 75 MG tablet Take 1 tablet (75 mg total) by mouth daily. Start taking this medicine AFTER YOU COMPLETE THE 1 MONTH OF BRILLINTA. Do not take Plavix and Brillinta at  the same time. 06/22/20  Yes Vashti Hey, MD  doxazosin (CARDURA) 4 MG tablet Take 4 mg by mouth daily. 07/08/20  Yes [provider]  DULoxetine (CYMBALTA) 60 MG capsule Take 60 mg by mouth at bedtime.   Yes [provider]  Eszopiclone 3 MG TABS Take 3 mg by mouth at bedtime. Take immediately before bedtime   Yes [provider]  ferrous sulfate 325 (65 FE) MG tablet Take 1 tablet (325 mg total) by mouth daily. 04/12/18 04/06/20 Yes Angiulli, Lavon Paganini, PA-C  furosemide (LASIX) 20 MG tablet Take 20 mg by mouth daily. 11/20/19  Yes [provider]  glucose blood (CONTOUR NEXT TEST) test strip 1 each by Other route 2 (two) times daily. And lancets 2/day Patient  taking differently: 1 each by Other route 2 (two) times daily. 04/12/18  Yes Angiulli, Lavon Paganini, PA-C  hydrALAZINE (APRESOLINE) 25 MG tablet Take 50 mg by mouth in the morning and at bedtime. FOR BLOOD PRESSURE ABOVE 150   Yes [provider]  levETIRAcetam (KEPPRA) 500 MG tablet TAKE 1 TABLET(500 MG) BY MOUTH TWICE DAILY Patient taking differently: Take 500 mg by mouth 2 (two) times daily. TAKE 1 TABLET(500 MG) BY MOUTH TWICE DAILY 07/15/18  Yes Lanae Boast, FNP  meclizine (ANTIVERT) 25 MG tablet TAKE 1 TABLET(25 MG) BY MOUTH THREE TIMES DAILY AS NEEDED FOR DIZZINESS Patient taking differently: Take 25 mg by mouth 3 (three) times daily as needed for dizziness. 06/10/18  Yes Lanae Boast, FNP  pantoprazole (PROTONIX) 40 MG tablet TAKE 1 TABLET(40 MG) BY MOUTH DAILY Patient taking differently: Take 40 mg by mouth daily. 04/23/19  Yes Jegede, Marlena Clipper, MD  pregabalin (LYRICA) 50 MG capsule Take 1 capsule by mouth at bedtime. 04/23/20  Yes [provider]  Semaglutide,0.25 or 0.'5MG'$ /DOS, (OZEMPIC, 0.25 OR 0.5 MG/DOSE,) 2 MG/1.5ML SOPN Inject 0.5 mg into the skin every Sunday. 02/27/20  Yes [provider]  topiramate (TOPAMAX) 25 MG tablet Take 50 mg by mouth at bedtime.  05/12/20  Yes [provider]  carvedilol (COREG) 12.5 MG tablet Take 1 tablet (12.5 mg total) by mouth 2 (two) times daily with a meal. Patient not taking: Reported on 10/02/2020 06/22/20 07/22/20  Vashti Hey, MD    Allergies    Lisinopril, Penicillins, and Gabapentin  Review of Systems   Review of Systems  Constitutional: Negative for fever.  Eyes: Positive for pain and visual disturbance.  Respiratory: Negative for cough and shortness of breath.   Cardiovascular: Negative for chest pain.  Gastrointestinal: Negative for abdominal pain, diarrhea, nausea and vomiting.  Genitourinary: Negative for difficulty urinating.  Musculoskeletal: Negative for back pain and neck pain.  Skin: Negative for rash.  Neurological: Positive for headaches. Negative for dizziness, syncope, facial asymmetry, speech difficulty, weakness and numbness.    Physical Exam Updated Vital Signs BP (!) 169/79   Pulse 74   Temp 98.4 F (36.9 C) (Oral)   Resp 16   LMP 05/22/2018 (Within Days)   SpO2 100%   Physical Exam Constitutional:      General: She is not in acute distress.    Appearance: Normal appearance. She is not ill-appearing.  HENT:     Head: Normocephalic and atraumatic.  Eyes:     General: No visual field deficit.    Extraocular Movements: Extraocular movements intact.     Conjunctiva/sclera: Conjunctivae normal.     Pupils: Pupils are equal, round, and reactive to light.  Cardiovascular:     Rate and Rhythm: Normal rate and regular rhythm.     Pulses: Normal pulses.  Pulmonary:     Effort: Pulmonary effort is normal. No respiratory distress.  Musculoskeletal:        General: No swelling or tenderness.     Cervical back: Normal range of motion.  Skin:    General: Skin is warm and dry.     Findings: No erythema or rash.  Neurological:     General: No focal deficit present.     Mental Status: She is alert and oriented to person, place, and time.     GCS: GCS eye  subscore is 4. GCS verbal subscore is 5. GCS motor subscore is 6.     Cranial Nerves: No cranial nerve deficit,  dysarthria or facial asymmetry.     Sensory: No sensory deficit.     Motor: No weakness or tremor.     Coordination: Coordination normal. Finger-Nose-Finger Test normal.     Gait: Gait normal.     Comments: Right sided weakness and numbness (unchanged from baseline per pt)  Pupils bilaterally minimally reactive, 21m Right visual field deficit     ED Results / Procedures / Treatments   Labs (all labs ordered are listed, but only abnormal results are displayed) Labs Reviewed  CBC WITH DIFFERENTIAL/PLATELET - Abnormal; Notable for the following components:      Result Value   RBC 3.12 (*)    Hemoglobin 9.1 (*)    HCT 27.3 (*)    All other components within normal limits  COMPREHENSIVE METABOLIC PANEL - Abnormal; Notable for the following components:   Chloride 112 (*)    BUN 37 (*)    Creatinine, Ser 2.20 (*)    Total Bilirubin 0.2 (*)    GFR, Estimated 27 (*)    All other components within normal limits  CBG MONITORING, ED - Abnormal; Notable for the following components:   Glucose-Capillary 108 (*)    All other components within normal limits  HCG, QUANTITATIVE, PREGNANCY    EKG EKG Interpretation  Date/Time:  Saturday October 02 2020 15:28:21 EDT Ventricular Rate:  70 PR Interval:  143 QRS Duration: 101 QT Interval:  417 QTC Calculation: 450 R Axis:   72 Text Interpretation: Sinus rhythm Low voltage, precordial leads Probable anteroseptal infarct, old No significant change since last tracing Confirmed by SGareth Morgan([id number] on 10/02/2020 7:44:20 PM   Radiology CT Head Wo Contrast  Result Date: 10/02/2020 CLINICAL DATA:  Headache with history of stroke, loss of vision in right eye today. EXAM: CT HEAD WITHOUT CONTRAST TECHNIQUE: Contiguous axial images were obtained from the base of the skull through the vertex without intravenous contrast. COMPARISON:   Head CT June 19, 2020 and MRI brain June 19, 2020. FINDINGS: Brain: No evidence of acute large vascular territory infarction, hemorrhage, hydrocephalus, extra-axial collection or mass lesion/mass effect. Chronic infarcts of the right frontal white matter adjacent to the frontal horn, bilateral thalami, and left caudothalamic groove. Similar burden of chronic ischemic microvascular white matter disease. Vascular: No hyperdense vessel. Minimal atherosclerotic calcification of the internal carotid arteries to the skull base. Skull: Normal. Negative for fracture or focal lesion. Sinuses/Orbits: The paranasal sinuses and mastoid air cells are predominantly clear. Orbits are grossly unremarkable Other: Small frontal scalp lipoma. IMPRESSION: 1. No acute intracranial abnormality. 2. Similar burden of chronic microvascular ischemic disease. Electronically Signed   By: JDahlia BailiffMD   On: 10/02/2020 16:40   MR Brain W and Wo Contrast  Result Date: 10/02/2020 CLINICAL DATA:  Binocular vision loss EXAM: MRI HEAD WITHOUT AND WITH CONTRAST TECHNIQUE: Multiplanar, multiecho pulse sequences of the brain and surrounding structures were obtained without and with intravenous contrast. CONTRAST:  68mGADAVIST GADOBUTROL 1 MMOL/ML IV SOLN COMPARISON:  None. 06/19/2020 FINDINGS: Brain: No acute infarct, mass effect or extra-axial collection. Multiple chronic microhemorrhages in a predominantly central distribution. Hyperintense T2-weighted signal is moderately widespread throughout the white matter. Parenchymal volume and CSF spaces are normal. The midline structures are normal. Vascular: Major flow voids are preserved. Skull and upper cervical spine: Normal calvarium and skull base. Visualized upper cervical spine and soft tissues are normal. Sinuses/Orbits:No paranasal sinus fluid levels or advanced mucosal thickening. No mastoid or middle ear effusion. The orbits are normal.  Both optic nerves are normal. The globes  IMPRESSION: 1. No acute intracranial abnormality.  Normal orbits. 2. Multiple chronic microhemorrhages in a predominantly central distribution, consistent with cerebral amyloid angiopathy. 3. Findings of chronic microvascular ischemia, advanced for age. Electronically Signed   By: Ulyses Jarred M.D.   On: 10/02/2020 19:38    Procedures Procedures   Medications Ordered in ED Medications  tetracaine (PONTOCAINE) 0.5 % ophthalmic solution 2 drop (2 drops Both Eyes Given 10/02/20 1443)  fluorescein ophthalmic strip 1 strip (1 strip Both Eyes Given 10/02/20 1443)  LORazepam (ATIVAN) injection 0.5 mg (0.5 mg Intravenous Given 10/02/20 1738)  prochlorperazine (COMPAZINE) injection 10 mg (10 mg Intravenous Given 10/02/20 1711)  diphenhydrAMINE (BENADRYL) injection 25 mg (25 mg Intravenous Given 10/02/20 1710)  gadobutrol (GADAVIST) 1 MMOL/ML injection 6 mL (6 mLs Intravenous Contrast Given 10/02/20 1822)    ED Course  I have reviewed the triage vital signs and the nursing notes.  Pertinent labs & imaging results that were available during my care of the patient were reviewed by me and considered in my medical decision making (see chart for details).    Visual Acuity  Right Eye Distance: 20/200 Left Eye Distance: nothing Bilateral Distance: half of the E on the 20/200  Right Eye Near:   Left Eye Near:    Bilateral Near:       MDM Rules/Calculators/A&P                         50 year old female with a history of multiple CVAs, hypertension, diabetes, CKD, seizure, vision loss in left eye (she reports history of cataract surgery and history of loss of blood flow/seeing retinal specialist previously but does not currently have Ophthalmologist due to insurance) who presents with concern for acute onset right vision loss.  Given last known normal last night without new weakness or numbness, do not see signs of LVO or indication to call code stroke.  Given concern for peripheral visual field deficit on  exam, ordered CT head and MRI brain with and without for further evaluation.   MRI shows no acute intracranial abnormality. Similar appearance of microhemorrhages to prior, discussed with Neurology and given presence prior and decision regarding DAPT made at that time will continue current therapies for stroke prevention.  IOP normal, no sign of corneal abrasion.  Hx is not consistent with retinal detachment.  Doubt endopthalmitis given no hx of IVDU, afebrile, no injection/corneal edema, no hypopyon. Suspect haziness seen consistent with cataract as she states she has not had removal on that side.  Discussed with Dr. Manuella Ghazi Ophthalmology.  Given she has blurred vision without complete loss feel CRAO unlikely.  He will see her in the office for urgent visit in setting of significant vision loss in right eye in patient who has had previous vision loss of left eye.    Final Clinical Impression(s) / ED Diagnoses Final diagnoses:  Vision loss, right eye    Rx / DC Orders ED Discharge Orders    None       Gareth Morgan, MD 10/03/20 1048

## 2020-10-02 NOTE — ED Notes (Signed)
I made MD Schlossman aware of visual Acuity test

## 2020-10-02 NOTE — ED Notes (Signed)
Tetracaine and fluorescein strip at bedside.

## 2020-10-03 ENCOUNTER — Other Ambulatory Visit (HOSPITAL_COMMUNITY): Payer: Self-pay | Admitting: Emergency Medicine

## 2020-10-03 DIAGNOSIS — H538 Other visual disturbances: Secondary | ICD-10-CM

## 2020-10-06 ENCOUNTER — Ambulatory Visit: Payer: Medicaid Other | Admitting: Physical Therapy

## 2021-01-05 ENCOUNTER — Emergency Department (HOSPITAL_COMMUNITY): Payer: Medicare Other

## 2021-01-05 ENCOUNTER — Emergency Department (HOSPITAL_COMMUNITY)
Admission: EM | Admit: 2021-01-05 | Discharge: 2021-01-05 | Disposition: A | Discharge status: Home / Self Care | Payer: Medicare Other | Attending: Emergency Medicine | Admitting: Emergency Medicine

## 2021-01-05 ENCOUNTER — Telehealth: Payer: Self-pay | Admitting: Cardiovascular Disease

## 2021-01-05 DIAGNOSIS — Z5321 Procedure and treatment not carried out due to patient leaving prior to being seen by health care provider: Secondary | ICD-10-CM | POA: Diagnosis not present

## 2021-01-05 DIAGNOSIS — I639 Cerebral infarction, unspecified: Secondary | ICD-10-CM | POA: Insufficient documentation

## 2021-01-05 DIAGNOSIS — R2681 Unsteadiness on feet: Secondary | ICD-10-CM | POA: Diagnosis not present

## 2021-01-05 LAB — CBC
HCT: 32.5 % — ABNORMAL LOW (ref 36.0–46.0)
Hemoglobin: 10.3 g/dL — ABNORMAL LOW (ref 12.0–15.0)
MCH: 28.9 pg (ref 26.0–34.0)
MCHC: 31.7 g/dL (ref 30.0–36.0)
MCV: 91.3 fL (ref 80.0–100.0)
Platelets: 230 10*3/uL (ref 150–400)
RBC: 3.56 MIL/uL — ABNORMAL LOW (ref 3.87–5.11)
RDW: 12.8 % (ref 11.5–15.5)
WBC: 5 10*3/uL (ref 4.0–10.5)
nRBC: 0 % (ref 0.0–0.2)

## 2021-01-05 LAB — COMPREHENSIVE METABOLIC PANEL
ALT: 14 U/L (ref 0–44)
AST: 17 U/L (ref 15–41)
Albumin: 3.6 g/dL (ref 3.5–5.0)
Alkaline Phosphatase: 42 U/L (ref 38–126)
Anion gap: 6 (ref 5–15)
BUN: 32 mg/dL — ABNORMAL HIGH (ref 6–20)
CO2: 26 mmol/L (ref 22–32)
Calcium: 9.1 mg/dL (ref 8.9–10.3)
Chloride: 108 mmol/L (ref 98–111)
Creatinine, Ser: 2.82 mg/dL — ABNORMAL HIGH (ref 0.44–1.00)
GFR, Estimated: 20 mL/min — ABNORMAL LOW (ref >60)
Glucose, Bld: 136 mg/dL — ABNORMAL HIGH (ref 70–99)
Potassium: 3.9 mmol/L (ref 3.5–5.1)
Sodium: 140 mmol/L (ref 135–145)
Total Bilirubin: 0.2 mg/dL — ABNORMAL LOW (ref 0.3–1.2)
Total Protein: 7 g/dL (ref 6.5–8.1)

## 2021-01-05 LAB — DIFFERENTIAL
Abs Immature Granulocytes: 0.01 10*3/uL (ref 0.00–0.07)
Basophils Absolute: 0 10*3/uL (ref 0.0–0.1)
Basophils Relative: 1 %
Eosinophils Absolute: 0.1 10*3/uL (ref 0.0–0.5)
Eosinophils Relative: 2 %
Immature Granulocytes: 0 %
Lymphocytes Relative: 37 %
Lymphs Abs: 1.9 10*3/uL (ref 0.7–4.0)
Monocytes Absolute: 0.2 10*3/uL (ref 0.1–1.0)
Monocytes Relative: 5 %
Neutro Abs: 2.7 10*3/uL (ref 1.7–7.7)
Neutrophils Relative %: 55 %

## 2021-01-05 LAB — I-STAT CHEM 8, ED
BUN: 32 mg/dL — ABNORMAL HIGH (ref 6–20)
Calcium, Ion: 1.22 mmol/L (ref 1.15–1.40)
Chloride: 108 mmol/L (ref 98–111)
Creatinine, Ser: 2.8 mg/dL — ABNORMAL HIGH (ref 0.44–1.00)
Glucose, Bld: 130 mg/dL — ABNORMAL HIGH (ref 70–99)
HCT: 32 % — ABNORMAL LOW (ref 36.0–46.0)
Hemoglobin: 10.9 g/dL — ABNORMAL LOW (ref 12.0–15.0)
Potassium: 3.8 mmol/L (ref 3.5–5.1)
Sodium: 143 mmol/L (ref 135–145)
TCO2: 25 mmol/L (ref 22–32)

## 2021-01-05 LAB — CBG MONITORING, ED
Glucose-Capillary: 132 mg/dL — ABNORMAL HIGH (ref 70–99)
Glucose-Capillary: 113 mg/dL — ABNORMAL HIGH (ref 70–99)

## 2021-01-05 LAB — PROTIME-INR
INR: 1.1 (ref 0.8–1.2)
Prothrombin Time: 13.8 seconds (ref 11.4–15.2)

## 2021-01-05 LAB — I-STAT BETA HCG BLOOD, ED (MC, WL, AP ONLY): I-stat hCG, quantitative: <5 m[IU]/mL (ref <5)

## 2021-01-05 LAB — APTT: aPTT: 29 seconds (ref 24–36)

## 2021-01-05 NOTE — ED Notes (Signed)
Pt stated that she was going to leave to go somewhere else and for her IV to be taken out.

## 2021-01-05 NOTE — ED Notes (Signed)
CBG: 113 °

## 2021-01-05 NOTE — ED Notes (Addendum)
Pt called NT over stating that she would like to leave. Pt was encouraged to stay and be seen by a doctor but pt stated that "I'm never coming back here". NT removed pts IV.

## 2021-01-05 NOTE — Telephone Encounter (Signed)
Left a message for the pt to call back.  

## 2021-01-05 NOTE — ED Triage Notes (Signed)
Pt arrives via EMS from home with reports of trouble ambulating since Friday and increase in unsteadiness since. Pt denies any falls or pain. Hx of multiple strokes.

## 2021-01-05 NOTE — Telephone Encounter (Signed)
   Pt called she said she needs a sooner appt to see Dr. Oval Linsey or APP, however, she can only go to [facility], check APP schedule and offered soonest available on September, she said she needs to be sooner than that, she has not taken her meds for 2 months and she feels like she is having another stroke. She said she tried calling us for refill of her meds but she's been too sick

## 2021-01-11 MED ORDER — CLOPIDOGREL BISULFATE 75 MG PO TABS: 75.0000 mg | ORAL_TABLET | Freq: Every day | ORAL | 3 refills | Status: DC

## 2021-01-11 MED ORDER — AMLODIPINE BESYLATE 10 MG PO TABS: 10.0000 mg | ORAL_TABLET | Freq: Every day | ORAL | 3 refills | Status: DC

## 2021-01-11 MED ORDER — HYDRALAZINE HCL 25 MG PO TABS: 50.0000 mg | ORAL_TABLET | Freq: Two times a day (BID) | ORAL | 3 refills | Status: DC

## 2021-01-11 NOTE — Telephone Encounter (Signed)
Spoke to patient she stated she did not return call she was being transported by EMS to [facility] hospital.Stated she had another stroke because our office denied Plavix refill.Stated he needs Plavix,Hydralazine, Amlodipine refilled.Refills sent to pharmacy.Follow up appointment scheduled with Laurann Montana NP 8/19 at 8:30 am at [facility] location.Advised if her medications are denied in future please call office and ask to speak to Dr.'s nurse.

## 2021-01-12 NOTE — Telephone Encounter (Signed)
Late entry--spoke with pharmacy yesterday regarding Plavix, was told that request for medication had been not been sent to Dr Oval Linsey however were waiting on reply for Amlodipine request. Advised pharmacy that Amlodipine, Plavix, and Hydralazine updated Rx's had been sent in

## 2021-01-15 ENCOUNTER — Emergency Department (HOSPITAL_COMMUNITY)
Admission: EM | Admit: 2021-01-15 | Discharge: 2021-01-16 | Disposition: A | Discharge status: Home / Self Care | Payer: Medicare Other | Attending: Emergency Medicine | Admitting: Emergency Medicine

## 2021-01-15 ENCOUNTER — Encounter (HOSPITAL_COMMUNITY): Payer: Self-pay | Admitting: Emergency Medicine

## 2021-01-15 ENCOUNTER — Emergency Department (HOSPITAL_COMMUNITY): Payer: Medicare Other

## 2021-01-15 DIAGNOSIS — I129 Hypertensive chronic kidney disease with stage 1 through stage 4 chronic kidney disease, or unspecified chronic kidney disease: Secondary | ICD-10-CM | POA: Diagnosis not present

## 2021-01-15 DIAGNOSIS — R42 Dizziness and giddiness: Secondary | ICD-10-CM | POA: Insufficient documentation

## 2021-01-15 DIAGNOSIS — Z7982 Long term (current) use of aspirin: Secondary | ICD-10-CM | POA: Diagnosis not present

## 2021-01-15 DIAGNOSIS — R2681 Unsteadiness on feet: Secondary | ICD-10-CM

## 2021-01-15 DIAGNOSIS — Y900 Blood alcohol level of less than 20 mg/100 ml: Secondary | ICD-10-CM | POA: Diagnosis not present

## 2021-01-15 DIAGNOSIS — R2689 Other abnormalities of gait and mobility: Secondary | ICD-10-CM | POA: Insufficient documentation

## 2021-01-15 DIAGNOSIS — Z8673 Personal history of transient ischemic attack (TIA), and cerebral infarction without residual deficits: Secondary | ICD-10-CM

## 2021-01-15 DIAGNOSIS — Z79899 Other long term (current) drug therapy: Secondary | ICD-10-CM | POA: Insufficient documentation

## 2021-01-15 DIAGNOSIS — J4520 Mild intermittent asthma, uncomplicated: Secondary | ICD-10-CM | POA: Insufficient documentation

## 2021-01-15 DIAGNOSIS — Z20822 Contact with and (suspected) exposure to covid-19: Secondary | ICD-10-CM | POA: Diagnosis not present

## 2021-01-15 DIAGNOSIS — E1122 Type 2 diabetes mellitus with diabetic chronic kidney disease: Secondary | ICD-10-CM | POA: Insufficient documentation

## 2021-01-15 DIAGNOSIS — E1140 Type 2 diabetes mellitus with diabetic neuropathy, unspecified: Secondary | ICD-10-CM | POA: Insufficient documentation

## 2021-01-15 DIAGNOSIS — N184 Chronic kidney disease, stage 4 (severe): Secondary | ICD-10-CM | POA: Diagnosis not present

## 2021-01-15 LAB — I-STAT CHEM 8, ED
BUN: 31 mg/dL — ABNORMAL HIGH (ref 6–20)
Calcium, Ion: 1.26 mmol/L (ref 1.15–1.40)
Chloride: 112 mmol/L — ABNORMAL HIGH (ref 98–111)
Creatinine, Ser: 2.7 mg/dL — ABNORMAL HIGH (ref 0.44–1.00)
Glucose, Bld: 139 mg/dL — ABNORMAL HIGH (ref 70–99)
HCT: 32 % — ABNORMAL LOW (ref 36.0–46.0)
Hemoglobin: 10.9 g/dL — ABNORMAL LOW (ref 12.0–15.0)
Potassium: 4 mmol/L (ref 3.5–5.1)
Sodium: 147 mmol/L — ABNORMAL HIGH (ref 135–145)
TCO2: 25 mmol/L (ref 22–32)

## 2021-01-15 LAB — I-STAT BETA HCG BLOOD, ED (MC, WL, AP ONLY): I-stat hCG, quantitative: <5 m[IU]/mL (ref <5)

## 2021-01-15 NOTE — ED Notes (Signed)
Patient transported to CT 

## 2021-01-15 NOTE — ED Triage Notes (Signed)
Pt reports HA, poor po intake, changes in vision in left eye, unsteady gait since 1 pm today. Hx of stroke.

## 2021-01-15 NOTE — ED Provider Notes (Signed)
[facility] DEPT Provider Note   CSN: [id number] Arrival date & time: 01/15/21  2155     History No chief complaint on file.   Christina Orozco is a 50 y.o. female.  HPI Patient has history of prior CVA and multiple medical comorbidities.  She reports at 1 PM today she got an unsteady gait.  She reports she at baseline does use a cane or a walker but today she was even more unsteady.  She cannot specify to 1 leg or the other.  She reports she also felt dizzy shortly thereafter.  Dizziness is worse with position change and movement.  She reports she has a pretty bad headache with this.  Symptoms have been going on for the past 10 hours.  She had to wait for family members for transport.  Patient reports she has felt very nauseated as well.    Past Medical History:  Diagnosis Date   Anemia    severe   Asthma    CKD (chronic kidney disease)    stage IV   DDD (degenerative disc disease), cervical    Diabetes mellitus ([place])    Dislocated shoulder    right   GERD (gastroesophageal reflux disease)    Hypertension    Neuropathy    Stroke [facility])     Patient Active Problem List   Diagnosis Date Noted   Depression 07/05/2020   CVA (cerebral vascular accident) ([place]) 06/21/2020   CVA (cerebrovascular accident) ([place]) 06/19/2020   Abnormality of gait 10/14/2018   Dizziness and giddiness 09/13/2018   Debility 06/26/2018   Chronic nonintractable headache 0000000   Diastolic dysfunction    Congestion of nasal sinus    Pneumonia of left lower lobe due to infectious organism    SOB (shortness of breath)    Candidiasis    CKD (chronic kidney disease) stage 4, GFR 15-29 ml/min (HCC)    Acute on chronic anemia    Hypoalbuminemia due to protein-calorie malnutrition (HCC)    Essential hypertension    Non-insulin dependent type 2 diabetes mellitus ([place])    PRES (posterior reversible encephalopathy syndrome)    Hypokalemia    History of CVA with residual  deficit    History of seizure 03/25/2018   Bilateral carpal tunnel syndrome 02/14/2018   Arthritis 10/31/2017   Asthma 10/31/2017   Constipation 10/31/2017   Kidney stone 10/31/2017   Anemia of chronic disease 08/09/2017   Hyperlipidemia 07/26/2017   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[place]) 07/25/2017   Anxiety as acute reaction to exceptional stress 04/23/2017   Insomnia 10/08/2016   Neuropathy 06/07/2016   Gait instability    Lesion of pons    Slurred speech    Dizziness 04/30/2016   Hyperglycemia 04/30/2016   Chronic kidney disease (CKD), stage III (moderate) ([place]) 04/30/2016   Ataxia 04/30/2016   Chronic right shoulder pain 04/03/2016   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   Rhinitis, allergic 08/25/2015   Asthma, mild intermittent 07/14/2015   Gastroesophageal reflux disease without esophagitis 06/08/2015   Hypertensive cardiovascular disease 05/05/2015   Vitamin D deficiency 05/05/2015    Past Surgical History:  Procedure Laterality Date   ADENOIDECTOMY     APPENDECTOMY     CERVICAL SPINE SURGERY  06/2012   C5-C6   EYE SURGERY     left eye surgery    FOOT SURGERY     LOOP RECORDER INSERTION N/A 08/27/2017   Procedure: LOOP RECORDER INSERTION;  Surgeon: Sanda Klein, MD;  Location: [facility];  Service: Cardiovascular;  Laterality: N/A;   SHOULDER SURGERY     TEE WITHOUT CARDIOVERSION N/A 08/27/2017   Procedure: TRANSESOPHAGEAL ECHOCARDIOGRAM (TEE);  Surgeon: Sanda Klein, MD;  Location: [facility] ENDOSCOPY;  Service: Cardiovascular;  Laterality: N/A;   TONSILLECTOMY       OB History   No obstetric history on file.     Family History  Problem Relation Age of Onset   Vascular Disease Mother    CAD Mother    Heart failure Mother    Heart disease Other    Cancer Other        colon, stomach, pancreatic, lung   Diabetes Other    Breast cancer Sister 15   Seizures Maternal Uncle     Social History   Tobacco Use   Smoking status: Never   Smokeless tobacco:  Never  Vaping Use   Vaping Use: Never used  Substance Use Topics   Alcohol use: No   Drug use: No    Home Medications Prior to Admission medications   Medication Sig Start Date End Date Taking? Authorizing Provider  albuterol (PROAIR HFA) 108 (90 Base) MCG/ACT inhaler INHALE 1 OR 2 PUFFS INTO THE LUNGS EVERY 6 HOURS AS NEEDED FOR WHEEZING OR SHORTNESS OF BREATH Patient taking differently: Inhale 1-2 puffs into the lungs every 6 (six) hours as needed for shortness of breath or wheezing. 04/12/18   Angiulli, Lavon Paganini, PA-C  amLODipine (NORVASC) 10 MG tablet Take 1 tablet (10 mg total) by mouth daily. 01/11/21   Skeet Latch, MD  aspirin 81 MG EC tablet Take 81 mg by mouth daily.    [provider]  atorvastatin (LIPITOR) 80 MG tablet Take 80 mg by mouth at bedtime. 02/25/20   [provider]  carvedilol (COREG) 12.5 MG tablet Take 1 tablet (12.5 mg total) by mouth 2 (two) times daily with a meal. Patient not taking: Reported on 10/02/2020 06/22/20 07/22/20  Vashti Hey, MD  Cholecalciferol (VITAMIN D3 PO) Take 2,000 mg by mouth daily.    [provider]  clopidogrel (PLAVIX) 75 MG tablet Take 1 tablet (75 mg total) by mouth daily. 01/11/21   Skeet Latch, MD  doxazosin (CARDURA) 4 MG tablet Take 4 mg by mouth daily. 07/08/20   [provider]  DULoxetine (CYMBALTA) 60 MG capsule Take 60 mg by mouth at bedtime.    [provider]  Eszopiclone 3 MG TABS Take 3 mg by mouth at bedtime. Take immediately before bedtime    [provider]  ferrous sulfate 325 (65 FE) MG tablet Take 1 tablet (325 mg total) by mouth daily. 04/12/18 04/06/20  Angiulli, Lavon Paganini, PA-C  furosemide (LASIX) 20 MG tablet Take 20 mg by mouth daily. 11/20/19   [provider]  glucose blood (CONTOUR NEXT TEST) test strip 1 each by Other route 2 (two) times daily. And lancets 2/day Patient taking differently: 1 each by Other route 2 (two) times daily.  04/12/18   Angiulli, Lavon Paganini, PA-C  hydrALAZINE (APRESOLINE) 25 MG tablet Take 2 tablets (50 mg total) by mouth in the morning and at bedtime. FOR BLOOD PRESSURE ABOVE 150 01/11/21   Skeet Latch, MD  levETIRAcetam (KEPPRA) 500 MG tablet TAKE 1 TABLET(500 MG) BY MOUTH TWICE DAILY Patient taking differently: Take 500 mg by mouth 2 (two) times daily. TAKE 1 TABLET(500 MG) BY MOUTH TWICE DAILY 07/15/18   Lanae Boast, FNP  meclizine (ANTIVERT) 25 MG tablet TAKE 1 TABLET(25 MG) BY MOUTH THREE TIMES DAILY  AS NEEDED FOR DIZZINESS Patient taking differently: Take 25 mg by mouth 3 (three) times daily as needed for dizziness. 06/10/18   Lanae Boast, FNP  pantoprazole (PROTONIX) 40 MG tablet TAKE 1 TABLET(40 MG) BY MOUTH DAILY Patient taking differently: Take 40 mg by mouth daily. 04/23/19   Tresa Garter, MD  pregabalin (LYRICA) 50 MG capsule Take 1 capsule by mouth at bedtime. 04/23/20   [provider]  Semaglutide,0.25 or 0.'5MG'$ /DOS, (OZEMPIC, 0.25 OR 0.5 MG/DOSE,) 2 MG/1.5ML SOPN Inject 0.5 mg into the skin every Sunday. 02/27/20   [provider]  topiramate (TOPAMAX) 25 MG tablet Take 50 mg by mouth at bedtime. 05/12/20   [provider]    Allergies    Lisinopril, Penicillins, and Gabapentin  Review of Systems   Review of Systems 10 systems reviewed negative except as per HPI Physical Exam Updated Vital Signs BP (!) 177/95 (BP Location: Right Arm)   Pulse 83   Temp 99.2 F (37.3 C) (Oral)   Resp 14   Ht '5\' 2"'$  (1.575 m)   Wt 65.8 kg   LMP 05/22/2018 (Within Days)   SpO2 100%   BMI 26.52 kg/m   Physical Exam Constitutional:      Comments: Patient is alert but anxious in appearance.  No respiratory distress.  HENT:     Head: Normocephalic and atraumatic.     Mouth/Throat:     Mouth: Mucous membranes are moist.     Pharynx: Oropharynx is clear.  Eyes:     Comments: Extraocular motions appear conjugate although possible subtle lag for right eye  in left lateral gaze.  Pupils 2 mm symmetric.  Cardiovascular:     Rate and Rhythm: Normal rate and regular rhythm.  Pulmonary:     Effort: Pulmonary effort is normal.     Breath sounds: Normal breath sounds.  Abdominal:     Palpations: Abdomen is soft.     Comments: Patient endorses diffuse discomfort to palpation of the abdomen.  No guarding.  Musculoskeletal:        General: No swelling or tenderness.     Cervical back: Neck supple.     Right lower leg: No edema.     Left lower leg: No edema.  Skin:    General: Skin is warm and dry.  Neurological:     Comments: Patient appears slightly tremulous.  Speech is clear.  Content is normal.  No apparent expressive or receptive aphasia.  Patient is very slow performing finger nose exam but is able to do so bilaterally with concentrated effort.  No notable difference left to right.  No pronator drift.  Patient was asked to transfer to stretcher with weightbearing.  At baseline she does use a cane for additional stability.  Standing upright with her cane she was able to bear weight on both lower extremities.  She did use both lower extremities to transfer to the stretcher although she seemed tremulous and shaky doing so.  There was no lateralizing motor weakness.  Sensation intact to light touch.  Psychiatric:     Comments: Anxious.    ED Results / Procedures / Treatments   Labs (all labs ordered are listed, but only abnormal results are displayed) Labs Reviewed  RESP PANEL BY RT-PCR (FLU A&B, COVID) ARPGX2  ETHANOL  PROTIME-INR  APTT  CBC  DIFFERENTIAL  COMPREHENSIVE METABOLIC PANEL  RAPID URINE DRUG SCREEN, HOSP PERFORMED  URINALYSIS, ROUTINE W REFLEX MICROSCOPIC  I-STAT CHEM 8, ED  I-STAT BETA HCG  BLOOD, ED ([facility], [facility], [facility] ONLY)    EKG EKG Interpretation  Date/Time:  Sunday January 16 2021 00:26:06 EDT Ventricular Rate:  87 PR Interval:  149 QRS Duration: 76 QT Interval:  387 QTC Calculation: 466 R Axis:   61 Text  Interpretation: Sinus rhythm Anteroseptal infarct, old no acute ischemic appearance. Confirmed by Charlesetta Shanks [phone]) on 01/16/2021 12:29:49 AM Not uploaded to [place]. Radiology CT HEAD WO CONTRAST  Result Date: 01/15/2021 CLINICAL DATA:  Neuro deficit, acute, stroke suspected. Headache. Vision changes in left eye. Unsteady gait. EXAM: CT HEAD WITHOUT CONTRAST TECHNIQUE: Contiguous axial images were obtained from the base of the skull through the vertex without intravenous contrast. COMPARISON:  01/05/2021 FINDINGS: Brain: No acute intracranial abnormality. Specifically, no hemorrhage, hydrocephalus, mass lesion, acute infarction, or significant intracranial injury. Vascular: No hyperdense vessel or unexpected calcification. Skull: No acute calvarial abnormality. Sinuses/Orbits: No acute findings Other: None IMPRESSION: No acute intracranial abnormality. Electronically Signed   By: Rolm Baptise M.D.   On: 01/15/2021 23:36    Procedures Procedures   Medications Ordered in ED Medications - No data to display  ED Course  I have reviewed the triage vital signs and the nursing notes.  Pertinent labs & imaging results that were available during my care of the patient were reviewed by me and considered in my medical decision making (see chart for details).  Clinical Course as of 01/16/21 0016  Sat Jan 15, 2021  2305 Consult: Reviewed with Dr. Leonel Ramsay.  Reviewed presenting symptoms and at this time patient's timeframe from onset of symptoms patient is outside of tPA window.  Symptoms or not consistent at this time of LVO.  We will proceed with CT head and then anticipate likely MRI.  Patient has GFR not compatible with CT angiogram. [MP]    Clinical Course User Index [MP] Charlesetta Shanks, MD   MDM Rules/Calculators/A&P                          Patient presents with symptoms time of onset 1 PM.  Patient is out of tPA window.  She endorses newer or worsening gait unsteadiness and dizziness with  some vertiginous quality.  No aphasia or neglect.  Patient has prior existing visual loss in the left eye.  Does not meet LVO criteria.  CT head does not show acute intracranial finding.  With complaint of new weakness and dizziness, we will proceed with MRI to rule out acute stroke.  Patient has severe comorbid illness and prior history of CVAs.  If no acute findings, and patient returns to baseline function, may be appropriate for discharge and close follow-up. Dr.Cardama accepts for transfer for MRI at [facility] ED Final Clinical Impression(s) / ED Diagnoses Final diagnoses:  Vertigo  Gait instability    Rx / DC Orders ED Discharge Orders     None        Charlesetta Shanks, MD 01/16/21 [phone]

## 2021-01-16 ENCOUNTER — Emergency Department (HOSPITAL_COMMUNITY): Payer: Medicare Other

## 2021-01-16 DIAGNOSIS — I129 Hypertensive chronic kidney disease with stage 1 through stage 4 chronic kidney disease, or unspecified chronic kidney disease: Secondary | ICD-10-CM | POA: Diagnosis not present

## 2021-01-16 DIAGNOSIS — J4520 Mild intermittent asthma, uncomplicated: Secondary | ICD-10-CM | POA: Diagnosis not present

## 2021-01-16 DIAGNOSIS — Z79899 Other long term (current) drug therapy: Secondary | ICD-10-CM | POA: Diagnosis not present

## 2021-01-16 DIAGNOSIS — E1140 Type 2 diabetes mellitus with diabetic neuropathy, unspecified: Secondary | ICD-10-CM | POA: Diagnosis not present

## 2021-01-16 DIAGNOSIS — Z20822 Contact with and (suspected) exposure to covid-19: Secondary | ICD-10-CM | POA: Diagnosis not present

## 2021-01-16 DIAGNOSIS — Z7982 Long term (current) use of aspirin: Secondary | ICD-10-CM | POA: Diagnosis not present

## 2021-01-16 DIAGNOSIS — N184 Chronic kidney disease, stage 4 (severe): Secondary | ICD-10-CM | POA: Diagnosis not present

## 2021-01-16 DIAGNOSIS — Y900 Blood alcohol level of less than 20 mg/100 ml: Secondary | ICD-10-CM | POA: Diagnosis not present

## 2021-01-16 DIAGNOSIS — R42 Dizziness and giddiness: Secondary | ICD-10-CM | POA: Diagnosis not present

## 2021-01-16 DIAGNOSIS — E1122 Type 2 diabetes mellitus with diabetic chronic kidney disease: Secondary | ICD-10-CM | POA: Diagnosis not present

## 2021-01-16 DIAGNOSIS — R2689 Other abnormalities of gait and mobility: Secondary | ICD-10-CM | POA: Diagnosis not present

## 2021-01-16 LAB — RESP PANEL BY RT-PCR (FLU A&B, COVID) ARPGX2
Influenza A by PCR: NEGATIVE
Influenza B by PCR: NEGATIVE
SARS Coronavirus 2 by RT PCR: NEGATIVE

## 2021-01-16 LAB — COMPREHENSIVE METABOLIC PANEL
ALT: 17 U/L (ref 0–44)
AST: 18 U/L (ref 15–41)
Albumin: 4.7 g/dL (ref 3.5–5.0)
Alkaline Phosphatase: 55 U/L (ref 38–126)
Anion gap: 8 (ref 5–15)
BUN: 36 mg/dL — ABNORMAL HIGH (ref 6–20)
CO2: 25 mmol/L (ref 22–32)
Calcium: 10 mg/dL (ref 8.9–10.3)
Chloride: 112 mmol/L — ABNORMAL HIGH (ref 98–111)
Creatinine, Ser: 2.61 mg/dL — ABNORMAL HIGH (ref 0.44–1.00)
GFR, Estimated: 22 mL/min — ABNORMAL LOW (ref >60)
Glucose, Bld: 140 mg/dL — ABNORMAL HIGH (ref 70–99)
Potassium: 4 mmol/L (ref 3.5–5.1)
Sodium: 145 mmol/L (ref 135–145)
Total Bilirubin: 0.6 mg/dL (ref 0.3–1.2)
Total Protein: 8.7 g/dL — ABNORMAL HIGH (ref 6.5–8.1)

## 2021-01-16 LAB — PROTIME-INR
INR: 1 (ref 0.8–1.2)
Prothrombin Time: 12.7 seconds (ref 11.4–15.2)

## 2021-01-16 LAB — DIFFERENTIAL
Abs Immature Granulocytes: 0.06 10*3/uL (ref 0.00–0.07)
Basophils Absolute: 0 10*3/uL (ref 0.0–0.1)
Basophils Relative: 0 %
Eosinophils Absolute: 0 10*3/uL (ref 0.0–0.5)
Eosinophils Relative: 0 %
Immature Granulocytes: 1 %
Lymphocytes Relative: 13 %
Lymphs Abs: 1.3 10*3/uL (ref 0.7–4.0)
Monocytes Absolute: 0.3 10*3/uL (ref 0.1–1.0)
Monocytes Relative: 3 %
Neutro Abs: 9 10*3/uL — ABNORMAL HIGH (ref 1.7–7.7)
Neutrophils Relative %: 83 %

## 2021-01-16 LAB — CBC
HCT: 31.7 % — ABNORMAL LOW (ref 36.0–46.0)
Hemoglobin: 10.3 g/dL — ABNORMAL LOW (ref 12.0–15.0)
MCH: 29.1 pg (ref 26.0–34.0)
MCHC: 32.5 g/dL (ref 30.0–36.0)
MCV: 89.5 fL (ref 80.0–100.0)
Platelets: 263 10*3/uL (ref 150–400)
RBC: 3.54 MIL/uL — ABNORMAL LOW (ref 3.87–5.11)
RDW: 13.2 % (ref 11.5–15.5)
WBC: 10.7 10*3/uL — ABNORMAL HIGH (ref 4.0–10.5)
nRBC: 0 % (ref 0.0–0.2)

## 2021-01-16 LAB — APTT: aPTT: 27 seconds (ref 24–36)

## 2021-01-16 LAB — ETHANOL: Alcohol, Ethyl (B): <10 mg/dL (ref <10)

## 2021-01-16 MED ORDER — MECLIZINE HCL 25 MG PO TABS: 25.0000 mg | ORAL_TABLET | Freq: Three times a day (TID) | ORAL | 0 refills | Status: DC | PRN

## 2021-01-16 MED ORDER — MECLIZINE HCL 25 MG PO TABS
25.0000 mg | ORAL_TABLET | Freq: Once | ORAL | Status: AC
  Administered 2021-01-16: 25 mg via ORAL
  Filled 2021-01-16: qty 1

## 2021-01-16 MED ORDER — LORAZEPAM 2 MG/ML IJ SOLN
1.0000 mg | Freq: Once | INTRAMUSCULAR | Status: DC | PRN
Start: 2021-01-16 — End: 2021-01-16

## 2021-01-16 NOTE — ED Notes (Signed)
E-signature pad unavailable at time of pt discharge. This RN discussed discharge materials with pt and answered all pt questions. Pt stated understanding of discharge material. ? ?

## 2021-01-16 NOTE — ED Notes (Signed)
[facility] contacted for transport  

## 2021-01-16 NOTE — Discharge Instructions (Addendum)
Begin taking meclizine as prescribed as needed for dizziness.  Follow-up with your primary doctor in the next week, and return to the ER if symptoms significantly worsen or change.

## 2021-01-16 NOTE — ED Notes (Signed)
Pt's daughter reportedly left to wait in parking lot when pt went to MRI. This RN attempted to call pt's daughter per pt's discharge for ride home. Someone claiming to be another individual answered and told this RN that it was a wrong number. RN also attempted to call pt's cell phone, which she states is in the car with her daughter. No answer.

## 2021-01-16 NOTE — ED Notes (Signed)
Patient transported to MRI 

## 2021-01-16 NOTE — ED Triage Notes (Signed)
BIB [facility] from [facility]. CVA in January and May. At home at 1230 8/6 when she noticed blurred vision, blurred gait, and difficulty swallowing. Transferred for MRI at [facility] ED.   Vitals: 166/97 HR 93 RR 15 97% room air  20G IV in R AC

## 2021-01-16 NOTE — ED Notes (Signed)
ED TO INPATIENT HANDOFF REPORT  ED Nurse Name and Phone #: [phone] Erick Colace, RN   S Name/Age/Gender [facility] 50 y.o. female Room/Bed: RESB/RESB  Code Status   Code Status: Prior  Home/SNF/Other Home Patient oriented to: self, place, time and situation Is this baseline? Yes   Triage Complete: Triage complete  Chief Complaint Stroke-Like Symptoms with Emesis  Triage Note Pt reports HA, poor po intake, changes in vision in left eye, unsteady gait since 1 pm today. Hx of stroke.     Allergies Allergies  Allergen Reactions  . Lisinopril Swelling and Other (See Comments)    Facial swelling   . Penicillins Hives    Has patient had a PCN reaction causing immediate rash, facial/tongue/throat swelling, SOB or lightheadedness with hypotension: yes Has patient had a PCN reaction causing severe rash involving mucus membranes or skin necrosis: No Has patient had a PCN reaction that required hospitalization No Has patient had a PCN reaction occurring within the last 10 years: No If all of the above answers are "NO", then may proceed wit  . Gabapentin Swelling and Other (See Comments)    "I thought I was having another stroke."     Level of Care/Admitting Diagnosis ED Disposition    ED Disposition  Transfer to Another Facility   Condition  --   Comment  The patient appears reasonably stabilized for transfer considering the current resources, flow, and capabilities available in the ED at this time, and I doubt any other [facility] requiring further screening and/or treatment in the ED prior to transfer is p resent.         B Medical/Surgery History Past Medical History:  Diagnosis Date  . Anemia    severe  . Asthma   . CKD (chronic kidney disease)    stage IV  . DDD (degenerative disc disease), cervical   . Diabetes mellitus ([place])   . Dislocated shoulder    right  . GERD (gastroesophageal reflux disease)   . Hypertension   . Neuropathy   . Stroke [facility])     Past Surgical History:  Procedure Laterality Date  . ADENOIDECTOMY    . APPENDECTOMY    . CERVICAL SPINE SURGERY  06/2012   C5-C6  . EYE SURGERY     left eye surgery   . FOOT SURGERY    . LOOP RECORDER INSERTION N/A 08/27/2017   Procedure: LOOP RECORDER INSERTION;  Surgeon: Sanda Klein, MD;  Location: [place] [facility];  Service: Cardiovascular;  Laterality: N/A;  . SHOULDER SURGERY    . TEE WITHOUT CARDIOVERSION N/A 08/27/2017   Procedure: TRANSESOPHAGEAL ECHOCARDIOGRAM (TEE);  Surgeon: Sanda Klein, MD;  Location: [facility] ENDOSCOPY;  Service: Cardiovascular;  Laterality: N/A;  . TONSILLECTOMY       A IV Location/Drains/Wounds Patient Lines/Drains/Airways Status    Active Line/Drains/Airways    Name Placement date Placement time Site Days   Peripheral IV 01/15/21 20 G Right Antecubital 01/15/21  2313  Antecubital  1          Intake/Output Last 24 hours No intake or output data in the 24 hours ending 01/16/21 0108  Labs/Imaging Results for orders placed or performed during the hospital encounter of 01/15/21 (from the past 48 hour(s))  Resp Panel by RT-PCR (Flu A&B, Covid) Nasopharyngeal Swab     Status: None   Collection Time: 01/15/21 11:13 PM   Specimen: Nasopharyngeal Swab; Nasopharyngeal(NP) swabs in vial transport medium  Result Value Ref Range   SARS Coronavirus 2  by RT PCR NEGATIVE NEGATIVE    Comment: (NOTE) SARS-CoV-2 target nucleic acids are NOT DETECTED.  The SARS-CoV-2 RNA is generally detectable in upper respiratory specimens during the acute phase of infection. The lowest concentration of SARS-CoV-2 viral copies this assay can detect is 138 copies/mL. A negative result does not preclude SARS-Cov-2 infection and should not be used as the sole basis for treatment or other patient management decisions. A negative result may occur with  improper specimen collection/handling, submission of specimen other than nasopharyngeal swab, presence of viral  mutation(s) within the areas targeted by this assay, and inadequate number of viral copies(<138 copies/mL). A negative result must be combined with clinical observations, patient history, and epidemiological information. The expected result is Negative.  Fact Sheet for Patients:  EntrepreneurPulse.com.au  Fact Sheet for Healthcare Providers:  IncredibleEmployment.be  This test is no t yet approved or cleared by the [place] FDA and  has been authorized for detection and/or diagnosis of SARS-CoV-2 by FDA under an Emergency Use Authorization (EUA). This EUA will remain  in effect (meaning this test can be used) for the duration of the COVID-19 declaration under Section 564(b)(1) of the Act, 21 U.S.C.section 360bbb-3(b)(1), unless the authorization is terminated  or revoked sooner.       Influenza A by PCR NEGATIVE NEGATIVE   Influenza B by PCR NEGATIVE NEGATIVE    Comment: (NOTE) The Xpert Xpress SARS-CoV-2/FLU/RSV plus assay is intended as an aid in the diagnosis of influenza from Nasopharyngeal swab specimens and should not be used as a sole basis for treatment. Nasal washings and aspirates are unacceptable for Xpert Xpress SARS-CoV-2/FLU/RSV testing.  Fact Sheet for Patients: EntrepreneurPulse.com.au  Fact Sheet for Healthcare Providers: IncredibleEmployment.be  This test is not yet approved or cleared by the [place] FDA and has been authorized for detection and/or diagnosis of SARS-CoV-2 by FDA under an Emergency Use Authorization (EUA). This EUA will remain in effect (meaning this test can be used) for the duration of the COVID-19 declaration under Section 564(b)(1) of the Act, 21 U.S.C. section 360bbb-3(b)(1), unless the authorization is terminated or revoked.  Performed at [facility], [place] [address], [place], [place] [address]   Protime-INR     Status: None    Collection Time: 01/15/21 11:13 PM  Result Value Ref Range   Prothrombin Time 12.7 11.4 - 15.2 seconds   INR 1.0 0.8 - 1.2    Comment: (NOTE) INR goal varies based on device and disease states. Performed at [facility], [place] [address] [place], [place] [address]   APTT     Status: None   Collection Time: 01/15/21 11:13 PM  Result Value Ref Range   aPTT 27 24 - 36 seconds    Comment: Performed at [facility], [place] [address] [place], [place] [address]  CBC     Status: Abnormal   Collection Time: 01/15/21 11:13 PM  Result Value Ref Range   WBC 10.7 (H) 4.0 - 10.5 K/uL   RBC 3.54 (L) 3.87 - 5.11 MIL/uL   Hemoglobin 10.3 (L) 12.0 - 15.0 g/dL   HCT 31.7 (L) 36.0 - 46.0 %   MCV 89.5 80.0 - 100.0 fL   MCH 29.1 26.0 - 34.0 pg   MCHC 32.5 30.0 - 36.0 g/dL   RDW 13.2 11.5 - 15.5 %   Platelets 263 150 - 400 K/uL   nRBC 0.0 0.0 - 0.2 %    Comment: Performed at [facility], [place] [address], [place], [place] [address]  Differential     Status: Abnormal   Collection Time: 01/15/21 11:13 PM  Result Value Ref Range   Neutrophils Relative % 83 %   Neutro Abs 9.0 (H) 1.7 - 7.7 K/uL   Lymphocytes Relative 13 %   Lymphs Abs 1.3 0.7 - 4.0 K/uL   Monocytes Relative 3 %   Monocytes Absolute 0.3 0.1 - 1.0 K/uL   Eosinophils Relative 0 %   Eosinophils Absolute 0.0 0.0 - 0.5 K/uL   Basophils Relative 0 %   Basophils Absolute 0.0 0.0 - 0.1 K/uL   Immature Granulocytes 1 %   Abs Immature Granulocytes 0.06 0.00 - 0.07 K/uL    Comment: Performed at [facility], [place] [address] [place], [place] [address]  Comprehensive metabolic panel     Status: Abnormal   Collection Time: 01/15/21 11:13 PM  Result Value Ref Range   Sodium 145 135 - 145 mmol/L   Potassium 4.0 3.5 - 5.1 mmol/L   Chloride 112 (H) 98 - 111 mmol/L   CO2 25 22 - 32 mmol/L   Glucose, Bld 140 (H) 70 - 99 mg/dL    Comment: Glucose reference range applies only  to samples taken after fasting for at least 8 hours.   BUN 36 (H) 6 - 20 mg/dL   Creatinine, Ser 2.61 (H) 0.44 - 1.00 mg/dL   Calcium 10.0 8.9 - 10.3 mg/dL   Total Protein 8.7 (H) 6.5 - 8.1 g/dL   Albumin 4.7 3.5 - 5.0 g/dL   AST 18 15 - 41 U/L   ALT 17 0 - 44 U/L   Alkaline Phosphatase 55 38 - 126 U/L   Total Bilirubin 0.6 0.3 - 1.2 mg/dL   GFR, Estimated 22 (L) >60 mL/min    Comment: (NOTE) Calculated using the CKD-EPI Creatinine Equation (2021)    Anion gap 8 5 - 15    Comment: Performed at [facility], [place] [address], [place], [place] [address]  I-Stat beta hCG blood, ED     Status: None   Collection Time: 01/15/21 11:23 PM  Result Value Ref Range   I-stat hCG, quantitative <5.0 <5 mIU/mL   Comment 3            Comment:   GEST. AGE      CONC.  (mIU/mL)   <=1 WEEK        5 - 50     2 WEEKS       50 - 500     3 WEEKS       100 - 10,000     4 WEEKS     1,000 - 30,000        FEMALE AND NON-PREGNANT FEMALE:     LESS THAN 5 mIU/mL   I-stat chem 8, ED     Status: Abnormal   Collection Time: 01/15/21 11:24 PM  Result Value Ref Range   Sodium 147 (H) 135 - 145 mmol/L   Potassium 4.0 3.5 - 5.1 mmol/L   Chloride 112 (H) 98 - 111 mmol/L   BUN 31 (H) 6 - 20 mg/dL   Creatinine, Ser 2.70 (H) 0.44 - 1.00 mg/dL   Glucose, Bld 139 (H) 70 - 99 mg/dL    Comment: Glucose reference range applies only to samples taken after fasting for at least 8 hours.   Calcium, Ion 1.26 1.15 - 1.40 mmol/L   TCO2 25 22 - 32 mmol/L  Hemoglobin 10.9 (L) 12.0 - 15.0 g/dL   HCT 32.0 (L) 36.0 - 46.0 %  Ethanol     Status: None   Collection Time: 01/15/21 11:35 PM  Result Value Ref Range   Alcohol, Ethyl (B) <10 <10 mg/dL    Comment: (NOTE) Lowest detectable limit for serum alcohol is 10 mg/dL.  For medical purposes only. Performed at [facility], [place] [address] [place], [place] [address]    CT HEAD WO CONTRAST  Result Date: 01/15/2021 CLINICAL DATA:  Neuro  deficit, acute, stroke suspected. Headache. Vision changes in left eye. Unsteady gait. EXAM: CT HEAD WITHOUT CONTRAST TECHNIQUE: Contiguous axial images were obtained from the base of the skull through the vertex without intravenous contrast. COMPARISON:  01/05/2021 FINDINGS: Brain: No acute intracranial abnormality. Specifically, no hemorrhage, hydrocephalus, mass lesion, acute infarction, or significant intracranial injury. Vascular: No hyperdense vessel or unexpected calcification. Skull: No acute calvarial abnormality. Sinuses/Orbits: No acute findings Other: None IMPRESSION: No acute intracranial abnormality. Electronically Signed   By: Rolm Baptise M.D.   On: 01/15/2021 23:36    Pending Labs Unresulted Labs (From admission, onward)    Start     Ordered   01/15/21 2253  Urine rapid drug screen (hosp performed)  ONCE - STAT,   STAT        01/15/21 2253   01/15/21 2253  Urinalysis, Routine w reflex microscopic  ONCE - STAT,   STAT        01/15/21 2253          Vitals/Pain Today's Vitals   01/15/21 2330 01/16/21 0000 01/16/21 0030 01/16/21 0100  BP: (!) 158/79 (!) 152/76 (!) 168/86 (!) 166/97  Pulse: 93 88 90 93  Resp: '19 17 12 15  '$ Temp:    99 F (37.2 C)  TempSrc:    Oral  SpO2: 98% 99% 99% 100%  Weight:      Height:        Isolation Precautions No active isolations  Medications Medications  meclizine (ANTIVERT) tablet 25 mg (has no administration in time range)  LORazepam (ATIVAN) injection 1 mg (has no administration in time range)    Mobility walks with person assist High fall risk   Focused Assessments Neuro Assessment Handoff:  Swallow screen pass? No    NIH Stroke Scale ( + Modified Stroke Scale Criteria)  LOC Questions (1b. )   +: Answers both questions correctly LOC Commands (1c. )   + : Performs both tasks correctly Best Gaze (2. )  +: Normal Visual (3. )  +: Partial hemianopia Motor Arm, Left (5a. )   +: No drift Motor Arm, Right (5b. )   +: No  drift Motor Leg, Left (6a. )   +: No drift Motor Leg, Right (6b. )   +: No drift Sensory (8. )   +: Normal, no sensory loss Best Language (9. )   +: No aphasia Extinction/Inattention (11.)   +: No Abnormality Modified SS Total  +: 1     Neuro Assessment:   Neuro Checks:      Last Documented NIHSS Modified Score: 1 (01/15/21 2210) Has TPA been given? No If patient is a Neuro Trauma and patient is going to OR before floor call report to [place] nurse: [phone] or [phone]     R Recommendations: See Admitting Provider Note  Report given to:   Additional Notes:

## 2021-01-16 NOTE — ED Provider Notes (Signed)
  Physical Exam  BP (!) 172/128 (BP Location: Left Arm)   Pulse 91   Temp 98 F (36.7 C) (Oral)   Resp 19   Ht '5\' 2"'$  (1.575 m)   Wt 65.8 kg   LMP 05/22/2018 (Within Days)   SpO2 98%   BMI 26.52 kg/m   Physical Exam Vitals and nursing note reviewed.  Constitutional:      General: She is not in acute distress.    Appearance: She is well-developed. She is not diaphoretic.  HENT:     Head: Normocephalic and atraumatic.  Cardiovascular:     Rate and Rhythm: Normal rate and regular rhythm.     Heart sounds: No murmur heard.   No friction rub. No gallop.  Pulmonary:     Effort: Pulmonary effort is normal. No respiratory distress.     Breath sounds: Normal breath sounds. No wheezing.  Abdominal:     General: Bowel sounds are normal. There is no distension.     Palpations: Abdomen is soft.     Tenderness: There is no abdominal tenderness.  Musculoskeletal:        General: Normal range of motion.     Cervical back: Normal range of motion and neck supple.  Skin:    General: Skin is warm and dry.  Neurological:     General: No focal deficit present.     Mental Status: She is alert and oriented to person, place, and time.     Cranial Nerves: No cranial nerve deficit.     Motor: Weakness present.     Comments: Generalized weakness is noted, perhaps worse on the left side.    ED Course/Procedures   Clinical Course as of 01/16/21 0554  Sat Jan 15, 2021  2305 Consult: Reviewed with Dr. Leonel Ramsay.  Reviewed presenting symptoms and at this time patient's timeframe from onset of symptoms patient is outside of tPA window.  Symptoms or not consistent at this time of LVO.  We will proceed with CT head and then anticipate likely MRI.  Patient has GFR not compatible with CT angiogram. [MP]    Clinical Course User Index [MP] Charlesetta Shanks, MD    Procedures  MDM  Patient sent from [facility] for [facility] to evaluate for stroke.  Patient with recent admission at [facility]  for a stroke.  She presents with weakness, vomiting, dizziness, and headache.  This began at approximately 1:00 this afternoon.  Patient's MRI has been obtained and read by radiology.  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  Care discussed with Dr. Leonel Ramsay from neurology who does not feel as though any additional work-up is indicated.  Continues to feel dizzy and has inquired about medication.  I will prescribe meclizine and see if this helps.     Veryl Speak, MD 01/16/21 [phone]

## 2021-01-20 ENCOUNTER — Telehealth (HOSPITAL_BASED_OUTPATIENT_CLINIC_OR_DEPARTMENT_OTHER): Payer: Self-pay | Admitting: Cardiovascular Disease

## 2021-01-20 NOTE — Telephone Encounter (Signed)
   Patient Name: Christina Orozco  DOB: 11-27-1970 MRN: [id number]  Primary Cardiologist: Skeet Latch, MD  Chart reviewed as part of pre-operative protocol coverage.  Patient already has OV scheduled 01/28/21 for pre-op clearance with Laurann Montana NP - this is also to follow up symptoms per 01/05/21 phone note.   Will route to Dr. Oval Linsey for input on holding Plavix prior to vitrectomy so that this information is available to [facility] when she sees the patient (as long as deemed clinically stable to hold). It appears she is on this for recurrent stroke but our office refills this. Dr. Oval Linsey - Please route response to P CV DIV PREOP (the pre-op pool). Thank you.  Will route to [place] as FYI only - clearance already listed in appt notes. Will send this update to requesting party and remove from preop APP pool.  Charlie Pitter, PA-C 01/20/2021, 5:24 PM

## 2021-01-20 NOTE — Telephone Encounter (Signed)
   [facility] Pre-operative Risk Assessment    Patient Name: Christina Orozco  DOB: 03/16/1971 MRN: [id number]  HEARTCARE STAFF:  - IMPORTANT!!!!!! Under Visit Info/Reason for Call, type in Other and utilize the format Clearance MM/DD/YY or Clearance TBD. Do not use dashes or single digits. - Please review there is not already an duplicate clearance open for this procedure. - If request is for dental extraction, please clarify the # of teeth to be extracted. - If the patient is currently at the dentist's office, call Pre-Op Callback Staff (MA/nurse) to input urgent request.  - If the patient is not currently in the dentist office, please route to the Pre-Op pool.  Request for surgical clearance:  What type of surgery is being performed? Vitrectomy of L Eye  When is this surgery scheduled? 02/09/21  What type of clearance is required (medical clearance vs. Pharmacy clearance to hold med vs. Both)? Both  Are there any medications that need to be held prior to surgery and how long? Plavix  Practice name and name of physician performing surgery? Dr. Ernst Breach, [facility]   What is the office phone number? [phone]   7.   What is the office fax number? [phone]  8.   Anesthesia type (None, local, MAC, general) ? MAC   Johnna Acosta 01/20/2021, 11:27 AM  _________________________________________________________________   (provider comments below)

## 2021-01-28 ENCOUNTER — Ambulatory Visit (HOSPITAL_BASED_OUTPATIENT_CLINIC_OR_DEPARTMENT_OTHER): Payer: Medicaid Other | Admitting: Family

## 2021-01-28 ENCOUNTER — Encounter (HOSPITAL_BASED_OUTPATIENT_CLINIC_OR_DEPARTMENT_OTHER): Payer: Self-pay | Admitting: Family

## 2021-01-28 ENCOUNTER — Other Ambulatory Visit: Payer: Self-pay

## 2021-01-28 VITALS — BP 116/80 | HR 74 | Ht 62.0 in | Wt 143.0 lb

## 2021-01-28 DIAGNOSIS — I10 Essential (primary) hypertension: Secondary | ICD-10-CM | POA: Diagnosis not present

## 2021-01-28 DIAGNOSIS — I119 Hypertensive heart disease without heart failure: Secondary | ICD-10-CM

## 2021-01-28 DIAGNOSIS — E785 Hyperlipidemia, unspecified: Secondary | ICD-10-CM

## 2021-01-28 DIAGNOSIS — Z8673 Personal history of transient ischemic attack (TIA), and cerebral infarction without residual deficits: Secondary | ICD-10-CM | POA: Diagnosis not present

## 2021-01-28 DIAGNOSIS — N184 Chronic kidney disease, stage 4 (severe): Secondary | ICD-10-CM | POA: Diagnosis not present

## 2021-01-28 MED ORDER — HYDRALAZINE HCL 25 MG PO TABS: 25.0000 mg | ORAL_TABLET | Freq: Two times a day (BID) | ORAL | 3 refills | Status: DC

## 2021-01-28 NOTE — Patient Instructions (Signed)
Loel Dubonnet, NP will send a note to your eye specialist that you may proceed with surgery. We would prefer you to remain on your Clopidogrel (Plavix) surrounding the procedure but you may hold if directed by the surgeon.  Medication Instructions:  Continue your current medications.   If you have blood pressures consistently more than 150 you may take an additional '25mg'$  tablet of Hydralazine. If your blood pressure is consistently more than 150, please contact our office.   *If you need a refill on your cardiac medications before your next appointment, please call your pharmacy*  Lab Work: None ordered today.   Testing/Procedures: None ordered today. Your recent EKG from 01/15/21 showed normal sinus rhythm which is a good result!   Follow-Up: At [facility], you and your health needs are our priority.  As part of our continuing mission to provide you with exceptional heart care, we have created designated Provider Care Teams.  These Care Teams include your primary Cardiologist (physician) and Advanced Practice Providers (APPs -  Physician Assistants and Nurse Practitioners) who all work together to provide you with the care you need, when you need it.  We recommend signing up for the patient portal called "MyChart".  Sign up information is provided on this After Visit Summary.  MyChart is used to connect with patients for Virtual Visits (Telemedicine).  Patients are able to view lab/test results, encounter notes, upcoming appointments, etc.  Non-urgent messages can be sent to your provider as well.   To learn more about what you can do with MyChart, go to [email or web address].    Your next appointment:   6 month(s)  The format for your next appointment:   In Person  Provider:   Skeet Latch, MD   Other Instructions  To prevent or reduce lower extremity swelling: Eat a low salt diet. Salt makes the body hold onto extra fluid which causes swelling. Sit with legs  elevated. For example, in the recliner or on an [place].  Wear knee-high compression stockings during the daytime. Ones labeled 15-20 mmHg provide good compression.   Recommend drinking less than 64 oz of fluid per day.

## 2021-01-28 NOTE — Progress Notes (Signed)
Office Visit    Patient Name: Christina Orozco Date of Encounter: 01/28/2021  PCP:  Rikki Spearing, NP   [place]  Cardiologist:  Skeet Latch, MD  Advanced Practice Provider:  No care team member to display Electrophysiologist:  None    Chief Complaint    Christina Orozco is a 50 y.o. female with a hx of hypertension, severely, diabetes, recurrent stroke, seizure, CKD IV, asthma presents today for preoperative cardiovascular clearance  Past Medical History    Past Medical History:  Diagnosis Date   Anemia    severe   Asthma    CKD (chronic kidney disease)    stage IV   DDD (degenerative disc disease), cervical    Diabetes mellitus ([place])    Dislocated shoulder    right   GERD (gastroesophageal reflux disease)    Hypertension    Neuropathy    Stroke [facility])    Past Surgical History:  Procedure Laterality Date   ADENOIDECTOMY     APPENDECTOMY     CERVICAL SPINE SURGERY  06/2012   C5-C6   EYE SURGERY     left eye surgery    FOOT SURGERY     LOOP RECORDER INSERTION N/A 08/27/2017   Procedure: LOOP RECORDER INSERTION;  Surgeon: Sanda Klein, MD;  Location: [place] [facility];  Service: Cardiovascular;  Laterality: N/A;   SHOULDER SURGERY     TEE WITHOUT CARDIOVERSION N/A 08/27/2017   Procedure: TRANSESOPHAGEAL ECHOCARDIOGRAM (TEE);  Surgeon: Sanda Klein, MD;  Location: [facility];  Service: Cardiovascular;  Laterality: N/A;   TONSILLECTOMY      Allergies  Allergies  Allergen Reactions   Lisinopril Swelling and Other (See Comments)    Facial swelling    Penicillins Hives    Has patient had a PCN reaction causing immediate rash, facial/tongue/throat swelling, SOB or lightheadedness with hypotension: yes Has patient had a PCN reaction causing severe rash involving mucus membranes or skin necrosis: No Has patient had a PCN reaction that required hospitalization No Has patient had a PCN reaction occurring within the last 10  years: No If all of the above answers are "NO", then may proceed wit   Gabapentin Swelling and Other (See Comments)    "I thought I was having another stroke."     History of Present Illness    Christina Orozco is a 50 y.o. female with a hx of hypertension, severely, diabetes, recurrent stroke, seizure, CKD IV, asthma last seen 06/17/20 by Dr. Oval Linsey.  Previous echocardiogram November 2017 LVEF 65 to 70% and mild basal septal hypertrophy.  Repeat echo June 2018 LVEF 65 to 70% with severe LVH.  She was initially diagnosed with hypertension in high school per her report. Multiple family member with hypertension in her mother had a heart attack at age 70 and several other family members with premature CAD.  She was admitted February 2019 with acute stroke in the left paramedian pons.  She presented with dizziness and admission blood pressure was 148/85.  Carotid Dopplers with bilateral 1% 9% stenosis and echo was unchanged from June 2018.  TEE performed 08/2017 no PFO.  Loop recorder placed at that time.  Loop recorder was removed 07/2019. Admitted September 2020 after recurrent stroke.  She was last seeen 06/17/20 doing overall well from a cardiac perspective.  Blood pressure has been much better controlled.  She did note mild lower extremity edema and she was instructed to take 20 mg of Lasix as needed for a day  or 2 then go back to her usual 10 mg dose.  She was admitted 06/19/2020 after presenting with acute onset right arm and leg weakness with aphasia and systolic blood pressure in the 240s.  Work-up revealed subacute infarct in the left lateral aspect of the pons -small vessel disease.  She was hospitalized 01/05/2021 - 01/07/2021 at [facility] with diagnosis of strokelike symptoms.  She had extensive stroke work-up including normal intracranial MRI and no hemodynamically significant stenosis on carotid ultrasound January 2022.  MRI brain revealed new late to subacute infarct in the  frontal lobe and possible pontine infarct as well.  She had been off of her Plavix for 2 months as she had run out. ED visit 01/15/21 with vertigo for which she was prescribed Meclizine.   Presents today for preoperative clearance for vitrectomy of left eye scheduled for 02/09/2021.  There is request to hold Plavix for 5 days prior to planned procedure. Reports no shortness of breath and stable dyspnea on exertion with more than usual activity. Reports no chest pain, pressure, or tightness. No edema, orthopnea, PND. Reports no palpitations.  Endorses significant dizziness for which she has been started on Meclizine by her primary care provider with minimal improvement. She is trialing additional few days of meclizine prior to being referred to ENT. Tells met he dizziness is a spinning sensation that occurs all the time. Denies near syncope and syncope. She continues to suffer from daily headaches and continues to follow with neurology. Her blood pressure has been well controlled at home.   EKGs/Labs/Other Studies Reviewed:   The following studies were reviewed today:  Echo 06/21/20  1. Left ventricular ejection fraction, by estimation, is 70 to 75%. The  left ventricle has hyperdynamic function. The left ventricle has no  regional wall motion abnormalities. There is moderate concentric left  ventricular hypertrophy and severe basal  septal hypertrophy. Left ventricular diastolic parameters were normal.   2. Right ventricular systolic function is normal. The right ventricular  size is normal. Tricuspid regurgitation signal is inadequate for assessing  PA pressure.   3. A small pericardial effusion is present. The pericardial effusion is  posterior to the left ventricle.   4. The mitral valve is normal in structure. No evidence of mitral valve  regurgitation. No evidence of mitral stenosis.   5. The aortic valve is normal in structure. Aortic valve regurgitation is  trivial. No aortic stenosis is  present.   6. The inferior vena cava is normal in size with greater than 50%  respiratory variability, suggesting right atrial pressure of 3 mmHg.   Carotid duplex 06/20/20 Right Carotid: Velocities in the right ICA are consistent with a 1-39%  stenosis.   Left Carotid: Velocities in the left ICA are consistent with a 1-39%  stenosis.   Vertebrals: Bilateral vertebral arteries demonstrate antegrade flow.   EKG:  No EKG is  ordered today.  The ekg performed 01/16/21 demonstrated independently reviewed shows NSR 87 bpm with no acute ST/T wave changes.  Recent Labs: 01/15/2021: ALT 17; BUN 31; Creatinine, Ser 2.70; Hemoglobin 10.9; Platelets 263; Potassium 4.0; Sodium 147  Recent Lipid Panel    Component Value Date/Time   CHOL 130 06/20/2020 0851   CHOL 135 06/17/2020 1053   TRIG 89 06/20/2020 0851   HDL 46 06/20/2020 0851   HDL 47 06/17/2020 1053   CHOLHDL 2.8 06/20/2020 0851   VLDL 18 06/20/2020 0851   LDLCALC 66 06/20/2020 0851   LDLCALC 63 06/17/2020  1053   Home Medications   Current Meds  Medication Sig   albuterol (PROAIR HFA) 108 (90 Base) MCG/ACT inhaler INHALE 1 OR 2 PUFFS INTO THE LUNGS EVERY 6 HOURS AS NEEDED FOR WHEEZING OR SHORTNESS OF BREATH (Patient taking differently: Inhale 1-2 puffs into the lungs every 6 (six) hours as needed for shortness of breath or wheezing.)   amLODipine (NORVASC) 10 MG tablet Take 1 tablet (10 mg total) by mouth daily.   aspirin 81 MG EC tablet Take 81 mg by mouth daily.   atorvastatin (LIPITOR) 80 MG tablet Take 80 mg by mouth at bedtime.   Cholecalciferol (VITAMIN D3 PO) Take 2,000 mg by mouth daily.   clopidogrel (PLAVIX) 75 MG tablet Take 1 tablet (75 mg total) by mouth daily.   DULoxetine (CYMBALTA) 60 MG capsule Take 60 mg by mouth at bedtime.   ferrous sulfate 325 (65 FE) MG EC tablet Take 325 mg by mouth daily with breakfast.   furosemide (LASIX) 20 MG tablet Take 20-40 mg by mouth as directed. Alternate Taking 1 tablet (20 mg) &  Taking 2 tablets (40 mg) Daily   glucose blood (CONTOUR NEXT TEST) test strip 1 each by Other route 2 (two) times daily. And lancets 2/day (Patient taking differently: 1 each by Other route 2 (two) times daily.)   levETIRAcetam (KEPPRA) 500 MG tablet TAKE 1 TABLET(500 MG) BY MOUTH TWICE DAILY (Patient taking differently: Take 500 mg by mouth 2 (two) times daily. TAKE 1 TABLET(500 MG) BY MOUTH TWICE DAILY)   meclizine (ANTIVERT) 25 MG tablet Take 1 tablet (25 mg total) by mouth 3 (three) times daily as needed for dizziness.   pantoprazole (PROTONIX) 40 MG tablet TAKE 1 TABLET(40 MG) BY MOUTH DAILY (Patient taking differently: Take 40 mg by mouth daily.)   Semaglutide,0.25 or 0.5MG/DOS, (OZEMPIC, 0.25 OR 0.5 MG/DOSE,) 2 MG/1.5ML SOPN Inject 0.5 mg into the skin every Sunday.   [DISCONTINUED] hydrALAZINE (APRESOLINE) 25 MG tablet Take 2 tablets (50 mg total) by mouth in the morning and at bedtime. FOR BLOOD PRESSURE ABOVE 150   Current Facility-Administered Medications for the 01/28/21 encounter (Office Visit) with Loel Dubonnet, NP  Medication   lidocaine-EPINEPHrine (XYLOCAINE W/EPI) 1 %-1:100000 (with pres) injection 10 mL     Review of Systems      All other systems reviewed and are otherwise negative except as noted above.  Physical Exam    VS:  BP 116/80   Pulse 74   Ht 5' 2" (1.575 m)   Wt 143 lb (64.9 kg)   LMP 05/22/2018 (Within Days)   BMI 26.16 kg/m  , BMI Body mass index is 26.16 kg/m.  Wt Readings from Last 3 Encounters:  01/28/21 143 lb (64.9 kg)  01/15/21 145 lb (65.8 kg)  06/19/20 154 lb 1.6 oz (69.9 kg)    GEN: Well nourished, well developed, in no acute distress. HEENT: normal. Neck: Supple, no JVD, carotid bruits, or masses. Cardiac: RRR, no murmurs, rubs, or gallops. No clubbing, cyanosis, edema.  Radials/PT 2+ and equal bilaterally.  Respiratory:  Respirations regular and unlabored, clear to auscultation bilaterally. GI: Soft, nontender, nondistended. MS:  No deformity or atrophy. Skin: Warm and dry, no rash. Neuro:  Strength and sensation are intact. Psych: Normal affect.  Assessment & Plan    Preoperative cardiovascular clearance - Upcoming vitrectomy with opthamology. She is deemed acceptable risk for the planned procedure without additional cardiovascular testing. She is on Plavix 58m QD given history of recurrent stroke. She  may hold for 5 days prior to procedure but should resume promptly postoperatively. She understands the importance of prompt resumption of antiplatelet therapy. Will route to requesting party so they are aware.   Hypertensive heart disease with LVH - BP well controlled. Continue current antihypertensive regimen including Amlodipine 49m QD, Coreg 12.521mBID, Lasix 20-4070mer nephrology. Does note she has been taking Hydralazine 4m7mD as she was unclear how to take. As her BP is well controlled, she will continue current dosing. She will contact our office if her blood pressure is consistently >130/80.   [facility]    Recurrent CVA - Continue to follow with neurology. Continue optimal BP and lipid control. Continue Atorvastatin, Plavix.  Dizziness / Vertigo - Continue to vollow with PCP and PRN Meclizine. Encouraged to seek ENT referral from PCP if no improvement.   HLD - 06/20/20 total cholesterol 130, HDL 46, LDL 66, tirglycerides 89. LDL at goal of <70. Continue Atorvastatin 80mg65m  Disposition: Follow up in 6 month(s) with Dr. RandoOval LinseyPP.  Signed, CaitlLoel Dubonnet8/19/2022, 9:28 PM [facility]

## 2021-02-09 ENCOUNTER — Other Ambulatory Visit: Payer: Self-pay | Admitting: Ophthalmology

## 2021-02-17 ENCOUNTER — Ambulatory Visit: Payer: Medicare Other | Attending: Internal Medicine | Admitting: Physical Therapy

## 2021-02-17 ENCOUNTER — Other Ambulatory Visit: Payer: Self-pay

## 2021-02-17 DIAGNOSIS — R2681 Unsteadiness on feet: Secondary | ICD-10-CM | POA: Insufficient documentation

## 2021-02-17 DIAGNOSIS — H8111 Benign paroxysmal vertigo, right ear: Secondary | ICD-10-CM | POA: Diagnosis not present

## 2021-02-17 DIAGNOSIS — R42 Dizziness and giddiness: Secondary | ICD-10-CM | POA: Insufficient documentation

## 2021-02-17 NOTE — Patient Instructions (Signed)
Benign Positional Vertigo Vertigo is the feeling that you or your surroundings are moving when they are not. Benign positional vertigo is the most common form of vertigo. This is usually a harmless condition (benign). This condition is positional. This means that symptoms are triggered by certain movements and positions. This condition can be dangerous if it occurs while you are doing something that could cause harm to yourself or others. This includes activities such as driving or operating machinery. What are the causes? The inner ear has fluid-filled canals that help your brain sense movement and balance. When the fluid moves, the brain receives messages about your body's position. With benign positional vertigo, calcium crystals in the inner ear break free and disturb the inner ear area. This causes your brain to receive confusing messages about your body's position. What increases the risk? You are more likely to develop this condition if: You are a woman. You are 48 years of age or older. You have recently had a head injury. You have an inner ear disease. What are the signs or symptoms? Symptoms of this condition usually happen when you move your head or your eyes in different directions. Symptoms may start suddenly and usually last for less than a minute. They include: Loss of balance and falling. Feeling like you are spinning or moving. Feeling like your surroundings are spinning or moving. Nausea and vomiting. Blurred vision. Dizziness. Involuntary eye movement (nystagmus). Symptoms can be mild and cause only minor problems, or they can be severe and interfere with daily life. Episodes of benign positional vertigo may return (recur) over time. Symptoms may also improve over time. How is this diagnosed? This condition may be diagnosed based on: Your medical history. A physical exam of the head, neck, and ears. Positional tests to check for or stimulate vertigo. You may be asked to  turn your head and change positions, such as going from sitting to lying down. A health care provider will watch for symptoms of vertigo. You may be referred to a health care provider who specializes in ear, nose, and throat problems (ENT or otolaryngologist) or a provider who specializes in disorders of the nervous system (neurologist). How is this treated? This condition may be treated in a session in which your health care provider moves your head in specific positions to help the displaced crystals in your inner ear move. Treatment for this condition may take several sessions. Surgery may be needed in severe cases, but this is rare. In some cases, benign positional vertigo may resolve on its own in 2-4 weeks. Follow these instructions at home: Safety Move slowly. Avoid sudden body or head movements or certain positions, as told by your health care provider. Avoid driving or operating machinery until your health care provider says it is safe. Avoid doing any tasks that would be dangerous to you or others if vertigo occurs. If you have trouble walking or keeping your balance, try using a cane for stability. If you feel dizzy or unstable, sit down right away. Return to your normal activities as told by your health care provider. Ask your health care provider what activities are safe for you. General instructions Take over-the-counter and prescription medicines only as told by your health care provider. Drink enough fluid to keep your urine pale yellow. Keep all follow-up visits. This is important. Contact a health care provider if: You have a fever. Your condition gets worse or you develop new symptoms. Your family or friends notice any behavioral changes. You  have nausea or vomiting that gets worse. You have numbness or a prickling and tingling sensation. Get help right away if you: Have difficulty speaking or moving. Are always dizzy or faint. Develop severe headaches. Have weakness in  your legs or arms. Have changes in your hearing or vision. Develop a stiff neck. Develop sensitivity to light. These symptoms may represent a serious problem that is an emergency. Do not wait to see if the symptoms will go away. Get medical help right away. Call your local emergency services (911 in the [place] Do not drive yourself to the hospital. Summary Vertigo is the feeling that you or your surroundings are moving when they are not. Benign positional vertigo is the most common form of vertigo. This condition is caused by calcium crystals in the inner ear that become displaced. This causes a disturbance in an area of the inner ear that helps your brain sense movement and balance. Symptoms include loss of balance and falling, feeling that you or your surroundings are moving, nausea and vomiting, and blurred vision. This condition can be diagnosed based on symptoms, a physical exam, and positional tests. Follow safety instructions as told by your health care provider and keep all follow-up visits. This is important. This information is not intended to replace advice given to you by your health care provider. Make sure you discuss any questions you have with your health care provider. Document Revised: 04/28/2020 Document Reviewed: 04/28/2020 Elsevier Patient Education  2022 [employer].

## 2021-02-18 NOTE — Therapy (Addendum)
[facility] [address] [place], [place], [address] Phone: [phone]   Fax:  [phone]  Physical Therapy Evaluation  Patient Details  Name: Christina Orozco MRN: [id number] Date of Birth: [birth year] Referring Provider (PT): Cathi Roan, PA-C   Encounter Date: 02/17/2021   PT End of Session - 02/18/21 0754     Visit Number 1    Number of Visits 5    Date for PT Re-Evaluation 03/25/21    PT Start Time 0801    PT Stop Time 0846    PT Time Calculation (min) 45 min    Activity Tolerance Patient tolerated treatment well    Behavior During Therapy [facility] for tasks assessed/performed             Past Medical History:  Diagnosis Date   Anemia    severe   Asthma    CKD (chronic kidney disease)    stage IV   DDD (degenerative disc disease), cervical    Diabetes mellitus ([place])    Dislocated shoulder    right   GERD (gastroesophageal reflux disease)    Hypertension    Neuropathy    Stroke [facility])     Past Surgical History:  Procedure Laterality Date   ADENOIDECTOMY     APPENDECTOMY     CERVICAL SPINE SURGERY  06/2012   C5-C6   EYE SURGERY     left eye surgery    FOOT SURGERY     LOOP RECORDER INSERTION N/A 08/27/2017   Procedure: LOOP RECORDER INSERTION;  Surgeon: Sanda Klein, MD;  Location: [place] [facility] CV LAB;  Service: Cardiovascular;  Laterality: N/A;   SHOULDER SURGERY     TEE WITHOUT CARDIOVERSION N/A 08/27/2017   Procedure: TRANSESOPHAGEAL ECHOCARDIOGRAM (TEE);  Surgeon: Sanda Klein, MD;  Location: [facility] ENDOSCOPY;  Service: Cardiovascular;  Laterality: N/A;   TONSILLECTOMY      There were no vitals filed for this visit.    Subjective Assessment - 02/17/21 0809     Subjective Pt reports she has had 8 strokes with most recent CVA at end of July 2022; pt states she was at [facility] ED and had to wait a very long time in the ED waiting room - left and went to [place]  went to [facility] ED on 01-15-21 with vertigo - was sent to [facility] to be evaluated for CVA, which was ruled out.  Pt states she has been told that the vertigo is residual from prior stroke but another doctor told her that is was "vertigo" and not stroke related.  Pt states she had nausea and vomiting due to vertigo on 01-15-21, which is why she went to ED, thinking she was having another stroke.  Pt reports she gets very dizzy when she lies on her right side -states "I feel like I'm falling down in a hole".    Pertinent History DM, seizures 2019, HTN, GERD, CKD IV, asthma, neuropathy, anemia, CVA R thalamus 2017; OA, diplopia, vertigo episode on 01-15-21, h/o multiple strokes    Limitations --    Patient Stated Goals resolve the dizziness and improve balance so able to walk without cane; also wants to improve handwriting    Currently in Pain? No/denies    Pain Onset --                [facility] PT Assessment - 02/18/21 0001       Assessment   Medical Diagnosis Vertigo: h/o multiple CVA's  Referring Provider (PT) Cathi Roan, PA-C    Onset Date/Surgical Date 01/15/21    Hand Dominance Right    Prior Therapy pt had PT at this facility from      Balance Screen   Has the patient fallen in the past 6 months Yes    How many times? 4    Has the patient had a decrease in activity level because of a fear of falling?  No    Is the patient reluctant to leave their home because of a fear of falling?  No      Home Social worker Private residence    Living Arrangements Alone      Prior Function   Level of [place] with basic ADLs   trying to obtain a home health aide   Vocation Other (comment)   trying to get disability     Observation/Other Assessments   Focus on Therapeutic Outcomes (FOTO)  was not captured by front office                    Vestibular Assessment - 02/18/21 0001       Vestibular Assessment   General Observation Pt amb. with  SPC      Symptom Behavior   Subjective history of current problem Pt states the vertigo is now intermittent: states it is worse when she lies on her Rt side: states she feels she is falling in a hole    Type of Dizziness  Spinning;Lightheadedness;"World moves";Blurred vision   has decreased vision in left eye - detached retina - is having surgery on 03-07-21   Frequency of Dizziness daily    Duration of Dizziness minutes    Aggravating Factors Rolling to right    Relieving Factors Comments;Lying supine   says praying relieves it   History of similar episodes pt had dizziness due to CVA in April 2022; states this time the dizziness started after the stroke      Oculomotor Exam   Oculomotor Alignment Normal    Spontaneous Absent    Gaze-induced  Absent    Comment pt states poor vision in Lt eye - surgery is planned on 03-07-21 - retinal ischemia      Positional Testing   Dix-Hallpike Dix-Hallpike Right;Dix-Hallpike Left    Sidelying Test Sidelying Right;Sidelying Left      Dix-Hallpike Right   Dix-Hallpike Right Duration approx. 20 secs    Dix-Hallpike Right Symptoms Upbeat, right rotatory nystagmus      Dix-Hallpike Left   Dix-Hallpike Left Duration none    Dix-Hallpike Left Symptoms No nystagmus      Sidelying Right   Sidelying Right Duration approx. 15 secs    Sidelying Right Symptoms Upbeat, right rotatory nystagmus      Sidelying Left   Sidelying Left Duration none    Sidelying Left Symptoms No nystagmus                Objective measurements completed on examination: See above findings.                PT Education - 02/18/21 0752     Education Details pt was given article on BPPV from [place] - website was written in large print to enable pt to see it clearly    Person(s) Educated Patient    Methods Explanation;Handout    Comprehension Verbalized understanding              PT Short Term Goals -  02/18/21 0803       PT SHORT TERM GOAL #1    Title ALL STGS = LTGS               PT Long Term Goals - 02/18/21 0803       PT LONG TERM GOAL #1   Title Pt will have a (-) Rt Dix-Hallpike test with no nystagmus and no c/o vertigo in test position to indicate resolution of Rt BPPV.    Baseline Rt Dix-Hallpike test (+) with nystagmus & c/o vertigo in test position    Time 4    Period Weeks    Status New    Target Date 03/25/21      PT LONG TERM GOAL #2   Title Pt will perform Rt sidelying and all other bed mobility without c/o vertigo.    Baseline Pt reports dizziness when she turns onto her Rt side - unable to tolerate lying on Rt side due to dizziness    Time 4    Period Weeks    Status New    Target Date 03/25/21      PT LONG TERM GOAL #3   Title Independent in HEP for habituation and self treatment exercises prn for vertigo.    Baseline Dependent    Time 4    Period Weeks    Status New    Target Date 03/25/21                    Plan - 02/18/21 0755     Clinical Impression Statement Pt is a 50 yr old lady with h/o mulitple strokes and episodic vertigo that started on 01-15-21.  Pt has (+) Rt Dix-Hallpike test with Rt rotary upbeating nystagmus and c/o vertigo in test position, indicative of Rt BPPV posterior canalithiasis.  Epley maneuver was performed for 1 rep due to time constraint.  Pt presents with unsteady gait and is currently using a SPC for assistance with ambulation.  Pt will benefit from vestibular PT to address Rt BPPV and c/o dizziness.    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 stroke)    Examination-Activity Limitations Stand;Transfers;Locomotion Level;Stairs    Examination-Participation Restrictions Cleaning;Community  Activity;Laundry;Driving;Shop;Meal Prep   cooking   Stability/Clinical Decision Making Stable/Uncomplicated    Clinical Decision Making Moderate    Rehab Potential Good    PT Frequency 1x / week   followed by 1x week for 2 weeks   PT Duration 4 weeks   followed by 1x week for 2 weeks   PT Treatment/Interventions ADLs/Self Care Home Management;Gait training;Stair training;Functional mobility training;Therapeutic activities;Patient/family education;Therapeutic exercise;Balance training;Neuromuscular re-education;Vestibular;Canalith Repostioning    PT Next Visit Plan Epley for Rt BPPV    PT Home Exercise Plan plan to give Nestor Lewandowsky exercises for HEP    Consulted and Agree with Plan of Care Patient             Patient will benefit from skilled therapeutic intervention in order to improve the following deficits and impairments:  Decreased balance, Difficulty walking, Dizziness  Visit Diagnosis: BPPV (benign paroxysmal positional vertigo), right - Plan: PT plan of care cert/re-cert  Dizziness and giddiness - Plan: PT plan of care cert/re-cert  Unsteadiness on feet - Plan: PT plan of care cert/re-cert  Problem List Patient Active Problem List   Diagnosis Date Noted   Depression 07/05/2020   CVA (cerebral vascular accident) ([place]) 06/21/2020   CVA (cerebrovascular accident) ([place]) 06/19/2020   Abnormality of gait 10/14/2018   Dizziness and giddiness 09/13/2018   Debility 06/26/2018   Chronic nonintractable headache 0000000   Diastolic dysfunction    Congestion of nasal sinus    Pneumonia of left lower lobe due to infectious organism    SOB (shortness of breath)    Candidiasis    CKD (chronic kidney disease) stage 4, GFR 15-29 ml/min (HCC)    Acute on chronic anemia    Hypoalbuminemia due to protein-calorie malnutrition (HCC)    Essential hypertension    Non-insulin dependent type 2 diabetes mellitus ([place])    PRES (posterior reversible encephalopathy syndrome)     Hypokalemia    History of CVA with residual deficit    History of seizure 03/25/2018   Bilateral carpal tunnel syndrome 02/14/2018   Arthritis 10/31/2017   Asthma 10/31/2017   Constipation 10/31/2017   Kidney stone 10/31/2017   Anemia of chronic disease 08/09/2017   Hyperlipidemia 07/26/2017   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[place]) 07/25/2017   Anxiety as acute reaction to exceptional stress 04/23/2017   Insomnia 10/08/2016   Neuropathy 06/07/2016   Gait instability    Lesion of pons    Slurred speech    Dizziness 04/30/2016   Hyperglycemia 04/30/2016   Chronic kidney disease (CKD), stage III (moderate) ([place]) 04/30/2016   Ataxia 04/30/2016   Chronic right shoulder pain 04/03/2016   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

## 2021-02-24 ENCOUNTER — Other Ambulatory Visit: Payer: Self-pay

## 2021-02-24 ENCOUNTER — Ambulatory Visit: Payer: Medicare Other | Admitting: Physical Therapy

## 2021-02-24 DIAGNOSIS — H8111 Benign paroxysmal vertigo, right ear: Secondary | ICD-10-CM

## 2021-02-24 DIAGNOSIS — R42 Dizziness and giddiness: Secondary | ICD-10-CM

## 2021-02-25 NOTE — Therapy (Signed)
[facility] [address] [place], [place], [address] Phone: [phone]   Fax:  [phone]  Physical Therapy Treatment  Patient Details  Name: Christina Orozco MRN: [id number] Date of Birth: 1970-07-07 Referring Provider (PT): Cathi Roan, PA-C   Encounter Date: 02/24/2021   PT End of Session - 02/25/21 1357     Visit Number 2    Number of Visits 5    Date for PT Re-Evaluation 03/25/21    Authorization Type [employer]    Authorization - Visit Number 2    Authorization - Number of Visits 43    PT Start Time 1018    PT Stop Time 1100    PT Time Calculation (min) 42 min    Activity Tolerance Patient tolerated treatment well    Behavior During Therapy WFL for tasks assessed/performed             Past Medical History:  Diagnosis Date   Anemia    severe   Asthma    CKD (chronic kidney disease)    stage IV   DDD (degenerative disc disease), cervical    Diabetes mellitus ([place])    Dislocated shoulder    right   GERD (gastroesophageal reflux disease)    Hypertension    Neuropathy    Stroke [facility])     Past Surgical History:  Procedure Laterality Date   ADENOIDECTOMY     APPENDECTOMY     CERVICAL SPINE SURGERY  06/2012   C5-C6   EYE SURGERY     left eye surgery    FOOT SURGERY     LOOP RECORDER INSERTION N/A 08/27/2017   Procedure: LOOP RECORDER INSERTION;  Surgeon: Sanda Klein, MD;  Location: [facility] CV LAB;  Service: Cardiovascular;  Laterality: N/A;   SHOULDER SURGERY     TEE WITHOUT CARDIOVERSION N/A 08/27/2017   Procedure: TRANSESOPHAGEAL ECHOCARDIOGRAM (TEE);  Surgeon: Sanda Klein, MD;  Location: [facility] ENDOSCOPY;  Service: Cardiovascular;  Laterality: N/A;   TONSILLECTOMY      There were no vitals filed for this visit.   Subjective Assessment - 02/24/21 1021     Subjective Pt states she had been doing well until Friday night when she woke up with vertigo - had  vomiting and nausea - slept most of day on Saturday; pt reports she had not taken any Meclizine until this episode occurred last Friday night    Patient Stated Goals resolve the dizziness and improve balance so able to walk without cane; also wants to improve handwriting    Currently in Pain? Yes    Pain Score 9     Pain Location Shoulder    Pain Orientation Left    Pain Descriptors / Indicators Aching;Throbbing    Pain Type Chronic pain    Pain Onset More than a month ago    Pain Frequency Intermittent                     Vestibular Assessment - 02/25/21 0001       Positional Testing   Dix-Hallpike Dix-Hallpike Right;Dix-Hallpike Left    Sidelying Test Sidelying Right;Sidelying Left      Dix-Hallpike Right   Dix-Hallpike Right Duration approx. 10 secs    Dix-Hallpike Right Symptoms Upbeat, right rotatory nystagmus      Dix-Hallpike Left   Dix-Hallpike Left Duration none    Dix-Hallpike Left Symptoms No nystagmus      Sidelying Right   Sidelying Right Duration approx.  5 secs - mild intensity    Sidelying Right Symptoms No nystagmus      Sidelying Left   Sidelying Left Duration none    Sidelying Left Symptoms No nystagmus                       Vestibular Treatment/Exercise - 02/25/21 0001       Vestibular Treatment/Exercise   Vestibular Treatment Provided Canalith Repositioning    Canalith Repositioning Epley Manuever Right    Habituation Exercises Comment   passive horizontal head turns to Rt side - pt reported dizziness with passive cervical Rt rotation - improved with repetition      EPLEY MANUEVER RIGHT   Number of Reps  3    Overall Response Improved Symptoms    Response Details  no nystagmus and no vertigo reported on 3rd rep                    PT Education - 02/25/21 1355     Education Details pt was instructed to perform sit to Rt sidelying if vertigo not fully resolved - 5 reps 1-2x/day    Methods  Explanation;Demonstration    Comprehension Verbalized understanding;Returned demonstration              PT Short Term Goals - 02/18/21 0803       PT SHORT TERM GOAL #1   Title ALL STGS = LTGS               PT Long Term Goals - 02/25/21 1402       PT LONG TERM GOAL #1   Title Pt will have a (-) Rt Dix-Hallpike test with no nystagmus and no c/o vertigo in test position to indicate resolution of Rt BPPV.    Baseline Rt Dix-Hallpike test (+) with nystagmus & c/o vertigo in test position    Time 4    Period Weeks    Status New      PT LONG TERM GOAL #2   Title Pt will perform Rt sidelying and all other bed mobility without c/o vertigo.    Baseline Pt reports dizziness when she turns onto her Rt side - unable to tolerate lying on Rt side due to dizziness    Time 4    Period Weeks    Status New      PT LONG TERM GOAL #3   Title Independent in HEP for habituation and self treatment exercises prn for vertigo.    Baseline Dependent    Time 4    Period Weeks    Status New                   Plan - 02/25/21 1358     Clinical Impression Statement Pt had (+) Rt Dix-Hallpike test with rotary nystagmus and c/o vertigo in test position.  Vertigo improved on 3rd rep of Epley and no nystagmus was noted on 3rd rep, indicative of resolution of Rt BPPV.  Pt did c/o provocation of dizziness with passive Rt cervical rotation with pt in supine position when performing horizontal roll test but no nystagmus was noted.  Dizziness decreased with repetition of Rt cervical rotation.  Cont to assess BPPV.    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 PMH: Rt thalamus stroke 2017, h/o lacunar strokes (01/2020, 2019), DM2, CKD stage 4, HTN, GERD, neuropathy (loss of vision Lt  eye from previous stroke)    Examination-Activity Limitations Stand;Transfers;Locomotion  Level;Stairs    Examination-Participation Restrictions Cleaning;Community Activity;Laundry;Driving;Shop;Meal Prep   cooking   Stability/Clinical Decision Making Stable/Uncomplicated    Rehab Potential Good    PT Frequency 1x / week   followed by 1x week for 2 weeks   PT Duration 4 weeks   followed by 1x week for 2 weeks   PT Treatment/Interventions ADLs/Self Care Home Management;Gait training;Stair training;Functional mobility training;Therapeutic activities;Patient/family education;Therapeutic exercise;Balance training;Neuromuscular re-education;Vestibular;Canalith Repostioning    PT Next Visit Plan Epley for Rt BPPV    PT Home Exercise Plan plan to give Nestor Lewandowsky exercises for HEP    Consulted and Agree with Plan of Care Patient             Patient will benefit from skilled therapeutic intervention in order to improve the following deficits and impairments:  Decreased balance, Difficulty walking, Dizziness  Visit Diagnosis: BPPV (benign paroxysmal positional vertigo), right  Dizziness and giddiness     Problem List Patient Active Problem List   Diagnosis Date Noted   Depression 07/05/2020   CVA (cerebral vascular accident) ([place]) 06/21/2020   CVA (cerebrovascular accident) ([place]) 06/19/2020   Abnormality of gait 10/14/2018   Dizziness and giddiness 09/13/2018   Debility 06/26/2018   Chronic nonintractable headache 0000000   Diastolic dysfunction    Congestion of nasal sinus    Pneumonia of left lower lobe due to infectious organism    SOB (shortness of breath)    Candidiasis    CKD (chronic kidney disease) stage 4, GFR 15-29 ml/min (HCC)    Acute on chronic anemia    Hypoalbuminemia due to protein-calorie malnutrition (HCC)    Essential hypertension    Non-insulin dependent type 2 diabetes mellitus ([place])    PRES (posterior reversible encephalopathy syndrome)    Hypokalemia    History of CVA with residual deficit    History of seizure 03/25/2018   Bilateral  carpal tunnel syndrome 02/14/2018   Arthritis 10/31/2017   Asthma 10/31/2017   Constipation 10/31/2017   Kidney stone 10/31/2017   Anemia of chronic disease 08/09/2017   Hyperlipidemia 07/26/2017   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[place]) 07/25/2017   Anxiety as acute reaction to exceptional stress 04/23/2017   Insomnia 10/08/2016   Neuropathy 06/07/2016   Gait instability    Lesion of pons    Slurred speech    Dizziness 04/30/2016   Hyperglycemia 04/30/2016   Chronic kidney disease (CKD), stage III (moderate) ([place]) 04/30/2016   Ataxia 04/30/2016   Chronic right shoulder pain 04/03/2016   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 [place], [place], [address] Phone: [phone]   Fax:  [phone]  Name: Christina Orozco MRN: [id number] Date of Birth: 1971-03-28

## 2021-03-01 ENCOUNTER — Ambulatory Visit: Payer: Medicare Other | Admitting: Physical Therapy

## 2021-03-01 ENCOUNTER — Other Ambulatory Visit: Payer: Self-pay

## 2021-03-01 DIAGNOSIS — H8111 Benign paroxysmal vertigo, right ear: Secondary | ICD-10-CM

## 2021-03-01 NOTE — Patient Instructions (Signed)
Sit to Side-Lying    Sit on edge of bed. 1. Turn head 45 to right. 2. Maintain head position and lie down slowly on left side. Hold until symptoms subside. 3. Sit up slowly. Hold until symptoms subside. 4. Turn head 45 to left. 5. Maintain head position and lie down slowly on right side. Hold until symptoms subside. 6. Sit up slowly. Repeat sequence _5___ times per session. Do __3__ sessions per day.  Copyright  VHI. All rights reserved.

## 2021-03-02 NOTE — Therapy (Signed)
[facility] [address] [place], [place], [address] Phone: [phone]   Fax:  [phone]  Physical Therapy Treatment & Discharge Summary  Patient Details  Name: Christina Orozco MRN: [id number] Date of Birth: 1970/09/19 Referring Provider (PT): Cathi Roan, PA-C   Encounter Date: 03/01/2021   PT End of Session - 03/02/21 1611     Visit Number 3    Number of Visits 5    Date for PT Re-Evaluation 03/25/21    Authorization Type [place] Medicaid [employer]    Authorization - Visit Number 3    Authorization - Number of Visits 67    PT Start Time 0935    PT Stop Time 0959    PT Time Calculation (min) 24 min    Activity Tolerance Patient tolerated treatment well    Behavior During Therapy WFL for tasks assessed/performed             Past Medical History:  Diagnosis Date   Anemia    severe   Asthma    CKD (chronic kidney disease)    stage IV   DDD (degenerative disc disease), cervical    Diabetes mellitus ([place])    Dislocated shoulder    right   GERD (gastroesophageal reflux disease)    Hypertension    Neuropathy    Stroke [facility])     Past Surgical History:  Procedure Laterality Date   ADENOIDECTOMY     APPENDECTOMY     CERVICAL SPINE SURGERY  06/2012   C5-C6   EYE SURGERY     left eye surgery    FOOT SURGERY     LOOP RECORDER INSERTION N/A 08/27/2017   Procedure: LOOP RECORDER INSERTION;  Surgeon: Sanda Klein, MD;  Location: [facility];  Service: Cardiovascular;  Laterality: N/A;   SHOULDER SURGERY     TEE WITHOUT CARDIOVERSION N/A 08/27/2017   Procedure: TRANSESOPHAGEAL ECHOCARDIOGRAM (TEE);  Surgeon: Sanda Klein, MD;  Location: [facility] ENDOSCOPY;  Service: Cardiovascular;  Laterality: N/A;   TONSILLECTOMY      There were no vitals filed for this visit.   Subjective Assessment - 03/02/21 1607     Subjective Pt reports she has not had any dizziness since she was here last week -  thinks the vertigo is resolved    Patient Stated Goals resolve the dizziness and improve balance so able to walk without cane; also wants to improve handwriting    Currently in Pain? No/denies    Pain Onset More than a month ago                     Vestibular Assessment - 03/02/21 0001       Positional Testing   Dix-Hallpike Dix-Hallpike Right;Dix-Hallpike Left    Sidelying Test Sidelying Right;Sidelying Left      Dix-Hallpike Right   Dix-Hallpike Right Duration none    Dix-Hallpike Right Symptoms No nystagmus      Dix-Hallpike Left   Dix-Hallpike Left Duration none    Dix-Hallpike Left Symptoms No nystagmus      Sidelying Right   Sidelying Right Duration none    Sidelying Right Symptoms No nystagmus      Sidelying Left   Sidelying Left Duration none    Sidelying Left Symptoms No nystagmus                               PT Education - 03/02/21 1610  Education Details issued [place] exercises for self treatment prn in future should another episode of BPPV occur    Person(s) Educated Patient    Methods Explanation;Demonstration;Handout    Comprehension Verbalized understanding;Returned demonstration              PT Short Term Goals - 02/18/21 0803       PT SHORT TERM GOAL #1   Title ALL STGS = LTGS               PT Long Term Goals - 03/01/21 0943       PT LONG TERM GOAL #1   Title Pt will have a (-) Rt Dix-Hallpike test with no nystagmus and no c/o vertigo in test position to indicate resolution of Rt BPPV.    Baseline Goal met 03-01-21    Time 4    Period Weeks    Status Achieved      PT LONG TERM GOAL #2   Title Pt will perform Rt sidelying and all other bed mobility without c/o vertigo.    Baseline Pt reports dizziness when she turns onto her Rt side - unable to tolerate lying on Rt side due to dizziness; 03-01-21 -- goal met    Time 4    Period Weeks    Status Achieved      PT LONG TERM GOAL #3   Title  Independent in HEP for habituation and self treatment exercises prn for vertigo.    Baseline Dependent    Time 4    Period Weeks    Status Achieved                    Patient will benefit from skilled therapeutic intervention in order to improve the following deficits and impairments:     Visit Diagnosis: BPPV (benign paroxysmal positional vertigo), right     Problem List Patient Active Problem List   Diagnosis Date Noted   Depression 07/05/2020   CVA (cerebral vascular accident) ([place]) 06/21/2020   CVA (cerebrovascular accident) ([place]) 06/19/2020   Abnormality of gait 10/14/2018   Dizziness and giddiness 09/13/2018   Debility 06/26/2018   Chronic nonintractable headache 09/47/0962   Diastolic dysfunction    Congestion of nasal sinus    Pneumonia of left lower lobe due to infectious organism    SOB (shortness of breath)    Candidiasis    CKD (chronic kidney disease) stage 4, GFR 15-29 ml/min (HCC)    Acute on chronic anemia    Hypoalbuminemia due to protein-calorie malnutrition (HCC)    Essential hypertension    Non-insulin dependent type 2 diabetes mellitus ([place])    PRES (posterior reversible encephalopathy syndrome)    Hypokalemia    History of CVA with residual deficit    History of seizure 03/25/2018   Bilateral carpal tunnel syndrome 02/14/2018   Arthritis 10/31/2017   Asthma 10/31/2017   Constipation 10/31/2017   Kidney stone 10/31/2017   Anemia of chronic disease 08/09/2017   Hyperlipidemia 07/26/2017   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[place]) 07/25/2017   Anxiety as acute reaction to exceptional stress 04/23/2017   Insomnia 10/08/2016   Neuropathy 06/07/2016   Gait instability    Lesion of pons    Slurred speech    Dizziness 04/30/2016   Hyperglycemia 04/30/2016   Chronic kidney disease (CKD), stage III (moderate) ([place]) 04/30/2016   Ataxia 04/30/2016   Chronic right shoulder pain 04/03/2016   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   Rhinitis, allergic  08/25/2015  Asthma, mild intermittent 07/14/2015   Gastroesophageal reflux disease without esophagitis 06/08/2015   Hypertensive cardiovascular disease 05/05/2015   Vitamin D deficiency 05/05/2015      PHYSICAL THERAPY DISCHARGE SUMMARY  Visits from Start of Care:  3  Current functional level related to goals / functional outcomes: See above for progress towards goals - Rt BPPV has resolved as of this time   Remaining deficits: None regarding dizziness   Education / Equipment: Pt has been instructed in [place] exercises for self treatment prn should BPPV re-occur in future  Patient agrees to discharge. Patient goals were met. Patient is being discharged due to meeting the stated rehab goals.   Alda Lea, PT 03/02/2021, [facility] [address] [place], [place], [address] Phone: [phone]   Fax:  [phone]  Name: Christina Orozco MRN: [id number] Date of Birth: 07/26/1970

## 2021-03-08 ENCOUNTER — Other Ambulatory Visit: Payer: Self-pay

## 2021-03-08 ENCOUNTER — Encounter (HOSPITAL_COMMUNITY): Payer: Self-pay | Admitting: Ophthalmology

## 2021-03-08 NOTE — Progress Notes (Signed)
Spoke with pt for pre-op call. Pt has hx of HTN, CKD, Stroke and diabetes. Pt is on Plavix, the cardiac clearance from 01/28/21 noted that pt could hold her Plavix 5 days prior to surgery. When I asked her when her last dose was, she stated, "I haven't taken it today, so I guess it was yesterday". I told her that the instructions were to hold 5 days prior, she states no one told her that. Aspirin 81 mg was not addressed in the cardiac clearance where the Plavix was addressed. Pt states she not take the Plavix today, tomorrow or Thursday prior to surgery.   Pt's last A1C was 5.9 on 12/14/20. She takes Ozempic once a week (she states she doesn't always take it once a week, maybe every 2 weeks) Instructed pt to check her blood sugar the morning of surgery. If blood sugar is 70 or below, treat with 1/2 cup of clear juice (apple or cranberry) and recheck blood sugar 15 minutes after drinking juice. If blood sugar continues to be 70 or below let the nurse know on arrival to Short Stay. She voiced understanding.  Pt's surgery is scheduled as ambulatory so no Covid test is required prior to surgery.   Chart sent to Anesthesia PA

## 2021-03-09 NOTE — Progress Notes (Addendum)
Anesthesia Chart Review:  Case: [id number] Date/Time: 03/10/21 0715   Procedures:      PARS PLANA VITRECTOMY WITH 25 GAUGE (Left)     MEMBRANE PEEL (Left)     LASER PHOTO ABLATION (Left)     Intravitreal Avastin Injection (Left)   Anesthesia type: General   Pre-op diagnosis: Proliferative diabetic retinopathy, vitreous hemorrhage left eye   Location: [facility] OR ROOM 08 / [facility]   Surgeons: Sherlynn Stalls, MD       DISCUSSION: Patient is a 50 year old female scheduled for the above procedure.  History includes never smoker, asthma, GERD, neuropathy, DM2, HTN, CVA (recurrent); 07/2017, sub acute findings 06/2020, 01/06/21, 01/16/21 subacute), CKD stage IV, anemia, PVD, c-spine surgery (2014), loop recorder insertion (08/27/2017, removed 08/04/19, no afib), migraines, vertigo.  Cardiology evaluation on 01/28/21 by Laurann Montana, NP. "Preoperative cardiovascular clearance - Upcoming vitrectomy with opthamology. She is deemed acceptable risk for the planned procedure without additional cardiovascular testing. She is on Plavix '75mg'$  QD given history of recurrent stroke. She may hold for 5 days prior to procedure but should resume promptly postoperatively. She understands the importance of prompt resumption of antiplatelet therapy. Will route to requesting party so they are aware."   As above recurrent CVAs. Had no afib on loop records X ~ 2 years. No significant ICA stenosis, normal MRA brain and neck on recent imaging. No PFO on 08/2017 TEE. Last neurology visit with Chales Salmon, MD ([facility]) on 02/21/21 for follow-up recurrent strokes, intractable migraines, history of seizure, and vertigo. Evidence of subacute infarct on MRIs from 01/06/21 and 01/16/21. She notes noncompliance with Plavix may be contributing to recurrent CVA.  (Under financial constraints.) Continue DPAT. Continue Keppra. Requesting Aimovig for migraine prophylaxis.  Vertigo felt related to stroke given nystagmus on exam.  Continue PT.  No further  seizure since 2019.  Dr. Jannifer Franklin notes patient needs surgery for left retina.    She reported last Plavix dose 03/07/21. [place] at Dr. Baird Cancer' office notified. Also notified that patient reported note having instructions on ASA 81 mg.  Reviewed case with anesthesiologist Nolon Nations, MD. Anesthesia team to evaluate on the day of surgery.    VS: LMP 05/22/2018 (Within Days)  BP Readings from Last 3 Encounters:  01/28/21 116/80  01/16/21 (!) 148/78  01/05/21 (!) 143/87   Pulse Readings from Last 3 Encounters:  01/28/21 74  01/16/21 85  01/05/21 73     PROVIDERS: Francine Graven D, NP is listed as PCP Skeet Latch, MD is cardiologist Chales Salmon, MD is neurologist ([facility]) Thereasa Distance, MD is nephrologist ([facility]). Per 02/08/21 note, "Her current baseline creatinine ranges from 2.2-2.5 for the past 6 months."   LABS: Last results include: Lab Results  Component Value Date   WBC 10.7 (H) 01/15/2021   HGB 10.9 (L) 01/15/2021   HCT 32.0 (L) 01/15/2021   PLT 263 01/15/2021   GLUCOSE 139 (H) 01/15/2021   ALT 17 01/15/2021   AST 18 01/15/2021   NA 147 (H) 01/15/2021   K 4.0 01/15/2021   CL 112 (H) 01/15/2021   CREATININE 2.70 (H) 01/15/2021   BUN 31 (H) 01/15/2021   CO2 25 01/15/2021   INR 1.0 01/15/2021   HGBA1C 6.2 (H) 06/19/2020   Lab Results  Component Value Date   CREATININE 2.70 (H) 01/15/2021   CREATININE 2.61 (H) 01/15/2021   CREATININE 2.80 (H) 01/05/2021   Creatinine ~ 2.2-2.8 since 06/2020.    IMAGES: MRI Brain 01/16/21: IMPRESSION: 1. Subacute lacunar infarct at  the left corpus callosum body. 2. Advanced chronic small vessel disease with chronic lacunar infarcts and micro hemorrhages.   MRI Brain 01/06/21 ([facility]): IMPRESSION:  - Small late acute to subacute pericallosal parasagittal left frontal  lobe infarct. Additional punctate acute to subacute infarct versus  artifact in the right pons.  - Otherwise similar appearance of chronic  microvascular ischemic  changes, chronic small vessel infarcts, and chronic  microhemorrhages.     MRA Brain 01/06/21 ([facility]): IMPRESSION:  Negative head MRA.   MRA neck 01/06/21 ([facility]): IMPRESSION:  Negative noncontrast neck MRA.    EKG: 01/16/21:  Sinus rhythm Anteroseptal infarct, old no acute ischemic appearance. Confirmed by Charlesetta Shanks [id number]) on 01/16/2021 12:29:49 AM  CV: Echo 06/21/20  1. Left ventricular ejection fraction, by estimation, is 70 to 75%. The  left ventricle has hyperdynamic function. The left ventricle has no  regional wall motion abnormalities. There is moderate concentric left  ventricular hypertrophy and severe basal  septal hypertrophy. Left ventricular diastolic parameters were normal.   2. Right ventricular systolic function is normal. The right ventricular  size is normal. Tricuspid regurgitation signal is inadequate for assessing  PA pressure.   3. A small pericardial effusion is present. The pericardial effusion is  posterior to the left ventricle.   4. The mitral valve is normal in structure. No evidence of mitral valve  regurgitation. No evidence of mitral stenosis.   5. The aortic valve is normal in structure. Aortic valve regurgitation is  trivial. No aortic stenosis is present.   6. The inferior vena cava is normal in size with greater than 50%  respiratory variability, suggesting right atrial pressure of 3 mmHg.    Carotid duplex 06/20/20 Right Carotid: Velocities in the right ICA are consistent with a 1-39%  stenosis.  Left Carotid: Velocities in the left ICA are consistent with a 1-39%  stenosis.  Vertebrals: Bilateral vertebral arteries demonstrate antegrade flow.    Nuclear stress test 09/28/17 Normal perfusion Nuclear stress EF: 61%. The study is normal. This is a low risk study.  TEE 08/27/17 Study Conclusions  - Left ventricle: Systolic function was normal. The estimated    ejection fraction was in the range of 55%  to 60%. Wall motion was    normal; there were no regional wall motion abnormalities. Left    ventricular diastolic function parameters were normal.  - Left [facility]: No evidence of thrombus in the atrial cavity or    appendage. No evidence of thrombus in the atrial cavity or    appendage. No spontaneous echo contrast was observed.  - Right [facility]: No evidence of thrombus in the atrial cavity or    appendage.  - Atrial septum: No defect or patent foramen ovale was identified.    Echo contrast study showed no right-to-left atrial level shunt,    at baseline or with provocation.    Past Medical History:  Diagnosis Date   Anemia    severe   Anxiety    Asthma    CKD (chronic kidney disease)    stage IV   DDD (degenerative disc disease), cervical    Depression    Diabetes mellitus (HCC)    Dislocated shoulder    right   GERD (gastroesophageal reflux disease)    Headache    migraines (about once a month)   History of kidney stones    Hypertension    Neuropathy    Peripheral vascular disease (HCC)    Pneumonia  Stroke [facility])     Past Surgical History:  Procedure Laterality Date   ADENOIDECTOMY     APPENDECTOMY     CERVICAL SPINE SURGERY  06/2012   C5-C6   EYE SURGERY     left eye surgery    FOOT SURGERY     LOOP RECORDER INSERTION N/A 08/27/2017   Procedure: LOOP RECORDER INSERTION;  Surgeon: Sanda Klein, MD;  Location: [place] [facility];  Service: Cardiovascular;  Laterality: N/A;   SHOULDER SURGERY     TEE WITHOUT CARDIOVERSION N/A 08/27/2017   Procedure: TRANSESOPHAGEAL ECHOCARDIOGRAM (TEE);  Surgeon: Sanda Klein, MD;  Location: [facility] ENDOSCOPY;  Service: Cardiovascular;  Laterality: N/A;   TONSILLECTOMY      MEDICATIONS:  lidocaine-EPINEPHrine (XYLOCAINE W/EPI) 1 %-1:100000 (with pres) injection 10 mL    albuterol (PROAIR HFA) 108 (90 Base) MCG/ACT inhaler   amLODipine (NORVASC) 10 MG tablet   aspirin 81 MG EC tablet   atorvastatin (LIPITOR) 80 MG tablet    carvedilol (COREG) 12.5 MG tablet   Cholecalciferol (VITAMIN D3 PO)   clopidogrel (PLAVIX) 75 MG tablet   diclofenac Sodium (VOLTAREN) 1 % GEL   DULoxetine (CYMBALTA) 60 MG capsule   ferrous sulfate 325 (65 FE) MG EC tablet   furosemide (LASIX) 20 MG tablet   hydrALAZINE (APRESOLINE) 25 MG tablet   levETIRAcetam (KEPPRA) 500 MG tablet   pantoprazole (PROTONIX) 40 MG tablet   Semaglutide,0.25 or 0.'5MG'$ /DOS, (OZEMPIC, 0.25 OR 0.5 MG/DOSE,) 2 MG/1.5ML SOPN   glucose blood (CONTOUR NEXT TEST) test strip   meclizine (ANTIVERT) 25 MG tablet   topiramate (TOPAMAX) 25 MG tablet   Myra Gianotti, PA-C Surgical Short Stay/Anesthesiology [facility] Phone [phone] [facility] [place] Phone [phone] 03/09/2021 4:15 PM

## 2021-03-09 NOTE — Anesthesia Preprocedure Evaluation (Addendum)
Anesthesia Evaluation  Patient identified by MRN, date of birth, ID band Patient awake    Reviewed: Allergy & Precautions, NPO status , Patient's Chart, lab work & pertinent test results  Airway Mallampati: II  TM Distance: >3 FB Neck ROM: Full    Dental  (+) Teeth Intact   Pulmonary asthma ,    Pulmonary exam normal        Cardiovascular hypertension, Pt. on medications and Pt. on home beta blockers + Peripheral Vascular Disease   Rhythm:Regular Rate:Normal     Neuro/Psych  Headaches, Anxiety Depression CVA (recurrent: 02/19, 01/22, 07/22, 08/22, on Plavix)    GI/Hepatic Neg liver ROS, GERD  Medicated,  Endo/Other  diabetes, Poorly Controlled, Type 2  Renal/GU CRFRenal disease  negative genitourinary   Musculoskeletal  (+) Arthritis , Osteoarthritis,    Abdominal (+)  Abdomen: soft.    Peds  Hematology  (+) anemia ,   Anesthesia Other Findings Diabetic retiopathy with vitreous hemorrhage   Reproductive/Obstetrics                           Anesthesia Physical Anesthesia Plan  ASA: 3  Anesthesia Plan: General   Post-op Pain Management:    Induction: Intravenous  PONV Risk Score and Plan: 3 and Ondansetron, Dexamethasone, Midazolam and Treatment may vary due to age or medical condition  Airway Management Planned: Mask and Oral ETT  Additional Equipment: None  Intra-op Plan:   Post-operative Plan: Extubation in OR  Informed Consent: I have reviewed the patients History and Physical, chart, labs and discussed the procedure including the risks, benefits and alternatives for the proposed anesthesia with the patient or authorized representative who has indicated his/her understanding and acceptance.     Dental advisory given  Plan Discussed with: CRNA  Anesthesia Plan Comments: (PAT note written 03/09/2021 by Myra Gianotti, PA-C. Lab Results      Component                 Value               Date                      WBC                      10.7 (H)            01/15/2021                HGB                      10.9 (L)            01/15/2021                HCT                      32.0 (L)            01/15/2021                MCV                      89.5                01/15/2021                PLT  263                 01/15/2021           Lab Results      Component                Value               Date                      NA                       147 (H)             01/15/2021                K                        4.0                 01/15/2021                CO2                      25                  01/15/2021                GLUCOSE                  139 (H)             01/15/2021                BUN                      31 (H)              01/15/2021                CREATININE               2.70 (H)            01/15/2021                CALCIUM                  10.0                01/15/2021                GFRNONAA                 22 (L)              01/15/2021           ECHO 01/22: 1. Left ventricular ejection fraction, by estimation, is 70 to 75%. The  left ventricle has hyperdynamic function. The left ventricle has no  regional wall motion abnormalities. There is moderate concentric left  ventricular hypertrophy and severe basal  septal hypertrophy. Left ventricular diastolic parameters were normal.  2. Right ventricular systolic function is normal. The right ventricular  size is normal. Tricuspid regurgitation signal is inadequate for assessing  PA pressure.  3. A small pericardial effusion is present. The pericardial effusion is  posterior to the left ventricle.  4. The mitral valve is normal in structure. No evidence of mitral valve  regurgitation. No evidence of mitral stenosis.  5. The aortic valve is normal in structure. Aortic valve regurgitation is  trivial. No aortic stenosis is present.  6. The inferior  vena cava is normal in size with greater than 50%  respiratory variability, suggesting right atrial pressure of 3 mmHg. )      Anesthesia Quick Evaluation

## 2021-03-10 ENCOUNTER — Other Ambulatory Visit: Payer: Self-pay

## 2021-03-10 ENCOUNTER — Ambulatory Visit (HOSPITAL_COMMUNITY): Payer: Medicaid Other | Admitting: Vascular Surgery

## 2021-03-10 ENCOUNTER — Ambulatory Visit (HOSPITAL_COMMUNITY): Payer: Medicare Other | Admitting: Vascular Surgery

## 2021-03-10 ENCOUNTER — Ambulatory Visit (HOSPITAL_COMMUNITY)
Admission: RE | Admit: 2021-03-10 | Discharge: 2021-03-10 | Disposition: A | Discharge status: Home / Self Care | Payer: Medicaid Other | Attending: Ophthalmology | Admitting: Ophthalmology

## 2021-03-10 ENCOUNTER — Encounter (HOSPITAL_COMMUNITY): Payer: Self-pay | Admitting: Ophthalmology

## 2021-03-10 ENCOUNTER — Encounter (HOSPITAL_COMMUNITY)
Admission: RE | Disposition: A | Discharge status: Home / Self Care | Payer: Self-pay | Source: Home / Self Care | Attending: Ophthalmology

## 2021-03-10 DIAGNOSIS — Z8673 Personal history of transient ischemic attack (TIA), and cerebral infarction without residual deficits: Secondary | ICD-10-CM | POA: Insufficient documentation

## 2021-03-10 DIAGNOSIS — I129 Hypertensive chronic kidney disease with stage 1 through stage 4 chronic kidney disease, or unspecified chronic kidney disease: Secondary | ICD-10-CM | POA: Insufficient documentation

## 2021-03-10 DIAGNOSIS — Z79899 Other long term (current) drug therapy: Secondary | ICD-10-CM | POA: Insufficient documentation

## 2021-03-10 DIAGNOSIS — Z880 Allergy status to penicillin: Secondary | ICD-10-CM | POA: Diagnosis not present

## 2021-03-10 DIAGNOSIS — E1151 Type 2 diabetes mellitus with diabetic peripheral angiopathy without gangrene: Secondary | ICD-10-CM | POA: Insufficient documentation

## 2021-03-10 DIAGNOSIS — H4312 Vitreous hemorrhage, left eye: Secondary | ICD-10-CM | POA: Insufficient documentation

## 2021-03-10 DIAGNOSIS — N184 Chronic kidney disease, stage 4 (severe): Secondary | ICD-10-CM | POA: Insufficient documentation

## 2021-03-10 DIAGNOSIS — E1169 Type 2 diabetes mellitus with other specified complication: Secondary | ICD-10-CM | POA: Diagnosis not present

## 2021-03-10 DIAGNOSIS — E113592 Type 2 diabetes mellitus with proliferative diabetic retinopathy without macular edema, left eye: Secondary | ICD-10-CM | POA: Diagnosis not present

## 2021-03-10 DIAGNOSIS — Z7902 Long term (current) use of antithrombotics/antiplatelets: Secondary | ICD-10-CM | POA: Diagnosis not present

## 2021-03-10 DIAGNOSIS — Z9049 Acquired absence of other specified parts of digestive tract: Secondary | ICD-10-CM | POA: Insufficient documentation

## 2021-03-10 DIAGNOSIS — Z7982 Long term (current) use of aspirin: Secondary | ICD-10-CM | POA: Insufficient documentation

## 2021-03-10 DIAGNOSIS — Z8249 Family history of ischemic heart disease and other diseases of the circulatory system: Secondary | ICD-10-CM | POA: Diagnosis not present

## 2021-03-10 DIAGNOSIS — E1122 Type 2 diabetes mellitus with diabetic chronic kidney disease: Secondary | ICD-10-CM | POA: Insufficient documentation

## 2021-03-10 DIAGNOSIS — Z888 Allergy status to other drugs, medicaments and biological substances status: Secondary | ICD-10-CM | POA: Insufficient documentation

## 2021-03-10 DIAGNOSIS — E1140 Type 2 diabetes mellitus with diabetic neuropathy, unspecified: Secondary | ICD-10-CM | POA: Insufficient documentation

## 2021-03-10 DIAGNOSIS — Z833 Family history of diabetes mellitus: Secondary | ICD-10-CM | POA: Insufficient documentation

## 2021-03-10 HISTORY — DX: Peripheral vascular disease, unspecified: I73.9

## 2021-03-10 HISTORY — PX: KENALOG INJECTION: SHX5298

## 2021-03-10 HISTORY — DX: Anxiety disorder, unspecified: F41.9

## 2021-03-10 HISTORY — PX: PHOTOCOAGULATION WITH LASER: SHX6027

## 2021-03-10 HISTORY — DX: Personal history of urinary calculi: Z87.442

## 2021-03-10 HISTORY — DX: Depression, unspecified: F32.A

## 2021-03-10 HISTORY — DX: Pneumonia, unspecified organism: J18.9

## 2021-03-10 HISTORY — PX: PARS PLANA VITRECTOMY: SHX2166

## 2021-03-10 HISTORY — PX: MEMBRANE PEEL: SHX5967

## 2021-03-10 HISTORY — DX: Headache, unspecified: R51.9

## 2021-03-10 LAB — GLUCOSE, CAPILLARY: Glucose-Capillary: 151 mg/dL — ABNORMAL HIGH (ref 70–99)

## 2021-03-10 SURGERY — PARS PLANA VITRECTOMY WITH 25 GAUGE
Anesthesia: General | Site: Eye | Laterality: Left

## 2021-03-10 MED ORDER — SODIUM HYALURONATE 10 MG/ML IO SOLUTION
PREFILLED_SYRINGE | INTRAOCULAR | Status: DC | PRN
  Administered 2021-03-10: .85 mL via INTRAOCULAR

## 2021-03-10 MED ORDER — ATROPINE SULFATE 1 % OP SOLN
1.0000 [drp] | OPHTHALMIC | Status: DC | PRN
  Administered 2021-03-10 (×3): 1 [drp] via OPHTHALMIC
  Filled 2021-03-10: qty 5

## 2021-03-10 MED ORDER — LIDOCAINE HCL (PF) 2 % IJ SOLN
INTRAMUSCULAR | Status: AC
  Filled 2021-03-10: qty 5

## 2021-03-10 MED ORDER — EPINEPHRINE PF 1 MG/ML IJ SOLN
INTRAMUSCULAR | Status: AC
  Filled 2021-03-10: qty 1

## 2021-03-10 MED ORDER — BUPIVACAINE HCL (PF) 0.75 % IJ SOLN
INTRAMUSCULAR | Status: AC
  Filled 2021-03-10: qty 10

## 2021-03-10 MED ORDER — HYALURONIDASE HUMAN 150 UNIT/ML IJ SOLN
INTRAMUSCULAR | Status: AC
  Filled 2021-03-10: qty 1

## 2021-03-10 MED ORDER — MIDAZOLAM HCL 2 MG/2ML IJ SOLN
INTRAMUSCULAR | Status: AC
  Filled 2021-03-10: qty 2

## 2021-03-10 MED ORDER — FENTANYL CITRATE (PF) 100 MCG/2ML IJ SOLN
INTRAMUSCULAR | Status: DC | PRN
  Administered 2021-03-10: 100 ug via INTRAVENOUS
  Administered 2021-03-10: 150 ug via INTRAVENOUS

## 2021-03-10 MED ORDER — PROPOFOL 10 MG/ML IV BOLUS
INTRAVENOUS | Status: DC | PRN
  Administered 2021-03-10: 130 mg via INTRAVENOUS

## 2021-03-10 MED ORDER — LIDOCAINE 2% (20 MG/ML) 5 ML SYRINGE
INTRAMUSCULAR | Status: DC | PRN
Start: 2021-03-10 — End: 2021-03-10
  Administered 2021-03-10: 60 mg via INTRAVENOUS

## 2021-03-10 MED ORDER — BSS PLUS IO SOLN
INTRAOCULAR | Status: AC
  Filled 2021-03-10: qty 500

## 2021-03-10 MED ORDER — ONDANSETRON HCL 4 MG/2ML IJ SOLN
INTRAMUSCULAR | Status: DC | PRN
Start: 2021-03-10 — End: 2021-03-10
  Administered 2021-03-10: 4 mg via INTRAVENOUS

## 2021-03-10 MED ORDER — FENTANYL CITRATE (PF) 100 MCG/2ML IJ SOLN: 25.0000 ug | INTRAMUSCULAR | Status: DC | PRN

## 2021-03-10 MED ORDER — DEXAMETHASONE SODIUM PHOSPHATE 10 MG/ML IJ SOLN
INTRAMUSCULAR | Status: DC | PRN
  Administered 2021-03-10: 10 mg

## 2021-03-10 MED ORDER — LACTATED RINGERS IV SOLN: INTRAVENOUS | Status: DC | PRN

## 2021-03-10 MED ORDER — FENTANYL CITRATE (PF) 250 MCG/5ML IJ SOLN
INTRAMUSCULAR | Status: AC
  Filled 2021-03-10: qty 5

## 2021-03-10 MED ORDER — SODIUM CHLORIDE 0.9 % IV SOLN
INTRAVENOUS | Status: DC | PRN
  Administered 2021-03-10: 25 ug/min via INTRAVENOUS

## 2021-03-10 MED ORDER — ONDANSETRON HCL 4 MG/2ML IJ SOLN
INTRAMUSCULAR | Status: AC
  Filled 2021-03-10: qty 2

## 2021-03-10 MED ORDER — MIDAZOLAM HCL 5 MG/5ML IJ SOLN
INTRAMUSCULAR | Status: DC | PRN
  Administered 2021-03-10: 2 mg via INTRAVENOUS

## 2021-03-10 MED ORDER — DEXAMETHASONE SODIUM PHOSPHATE 10 MG/ML IJ SOLN
INTRAMUSCULAR | Status: AC
  Filled 2021-03-10: qty 1

## 2021-03-10 MED ORDER — SODIUM HYALURONATE 10 MG/ML IO SOLUTION
PREFILLED_SYRINGE | INTRAOCULAR | Status: AC
  Filled 2021-03-10: qty 0.85

## 2021-03-10 MED ORDER — TRIAMCINOLONE ACETONIDE 40 MG/ML IJ SUSP
INTRAMUSCULAR | Status: AC
  Filled 2021-03-10: qty 5

## 2021-03-10 MED ORDER — AMISULPRIDE (ANTIEMETIC) 5 MG/2ML IV SOLN
INTRAVENOUS | Status: AC
  Filled 2021-03-10: qty 4

## 2021-03-10 MED ORDER — PHENYLEPHRINE HCL 2.5 % OP SOLN
1.0000 [drp] | OPHTHALMIC | Status: DC | PRN
  Administered 2021-03-10 (×3): 1 [drp] via OPHTHALMIC
  Filled 2021-03-10: qty 2

## 2021-03-10 MED ORDER — TOBRAMYCIN-DEXAMETHASONE 0.3-0.1 % OP OINT
TOPICAL_OINTMENT | OPHTHALMIC | Status: AC
  Filled 2021-03-10: qty 3.5

## 2021-03-10 MED ORDER — BEVACIZUMAB CHEMO INJECTION 1.25MG/0.05ML SYRINGE FOR KALEIDOSCOPE
1.2500 mg | Freq: Once | INTRAVITREAL | Status: AC
  Administered 2021-03-10: 1.25 mg via INTRAVITREAL
  Filled 2021-03-10 (×2): qty 0.1

## 2021-03-10 MED ORDER — EPINEPHRINE PF 1 MG/ML IJ SOLN
INTRAOCULAR | Status: DC | PRN
  Administered 2021-03-10: 500.3 mL

## 2021-03-10 MED ORDER — CHLORHEXIDINE GLUCONATE 0.12 % MT SOLN
OROMUCOSAL | Status: AC
  Administered 2021-03-10: 15 mL via OROMUCOSAL
  Filled 2021-03-10: qty 15

## 2021-03-10 MED ORDER — ORAL CARE MOUTH RINSE: 15.0000 mL | Freq: Once | OROMUCOSAL | Status: AC

## 2021-03-10 MED ORDER — ATROPINE SULFATE 1 % OP SOLN
OPHTHALMIC | Status: AC
  Filled 2021-03-10: qty 5

## 2021-03-10 MED ORDER — LIDOCAINE HCL 2 % IJ SOLN
INTRAMUSCULAR | Status: DC | PRN
  Administered 2021-03-10: 6 mL via RETROBULBAR

## 2021-03-10 MED ORDER — ACETAMINOPHEN 10 MG/ML IV SOLN: 1000.0000 mg | Freq: Once | INTRAVENOUS | Status: DC | PRN

## 2021-03-10 MED ORDER — NA CHONDROIT SULF-NA HYALURON 40-30 MG/ML IO SOSY
INTRAOCULAR | Status: AC
  Filled 2021-03-10: qty 0.5

## 2021-03-10 MED ORDER — TETRACAINE HCL 0.5 % OP SOLN
OPHTHALMIC | Status: AC
  Filled 2021-03-10: qty 4

## 2021-03-10 MED ORDER — SUGAMMADEX SODIUM 200 MG/2ML IV SOLN
INTRAVENOUS | Status: DC | PRN
  Administered 2021-03-10: 129.8 mg via INTRAVENOUS

## 2021-03-10 MED ORDER — VANCOMYCIN SUBCONJUNCTIVAL INJECTION 25 MG/0.5 ML
25.0000 mg | INTRAOCULAR | Status: AC
  Administered 2021-03-10: 25 mg via SUBCONJUNCTIVAL
  Filled 2021-03-10 (×2): qty 0.5

## 2021-03-10 MED ORDER — LACTATED RINGERS IV SOLN: INTRAVENOUS | Status: DC

## 2021-03-10 MED ORDER — ROCURONIUM BROMIDE 10 MG/ML (PF) SYRINGE
PREFILLED_SYRINGE | INTRAVENOUS | Status: AC
  Filled 2021-03-10: qty 10

## 2021-03-10 MED ORDER — PHENYLEPHRINE 40 MCG/ML (10ML) SYRINGE FOR IV PUSH (FOR BLOOD PRESSURE SUPPORT)
PREFILLED_SYRINGE | INTRAVENOUS | Status: DC | PRN
  Administered 2021-03-10 (×2): 80 ug via INTRAVENOUS

## 2021-03-10 MED ORDER — AMISULPRIDE (ANTIEMETIC) 5 MG/2ML IV SOLN
10.0000 mg | Freq: Once | INTRAVENOUS | Status: AC
  Administered 2021-03-10: 10 mg via INTRAVENOUS

## 2021-03-10 MED ORDER — DEXAMETHASONE SODIUM PHOSPHATE 4 MG/ML IJ SOLN
INTRAMUSCULAR | Status: DC | PRN
  Administered 2021-03-10: 4 mg via INTRAVENOUS

## 2021-03-10 MED ORDER — BSS IO SOLN
INTRAOCULAR | Status: AC
  Filled 2021-03-10: qty 15

## 2021-03-10 MED ORDER — BRILLIANT BLUE G 0.025 % IO SOSY
PREFILLED_SYRINGE | INTRAOCULAR | Status: DC | PRN
  Administered 2021-03-10: 0.5 mL via INTRAVITREAL

## 2021-03-10 MED ORDER — PROMETHAZINE HCL 25 MG/ML IJ SOLN: 6.2500 mg | INTRAMUSCULAR | Status: DC | PRN

## 2021-03-10 MED ORDER — CHLORHEXIDINE GLUCONATE 0.12 % MT SOLN: 15.0000 mL | Freq: Once | OROMUCOSAL | Status: AC

## 2021-03-10 MED ORDER — ROCURONIUM BROMIDE 10 MG/ML (PF) SYRINGE
PREFILLED_SYRINGE | INTRAVENOUS | Status: DC | PRN
Start: 2021-03-10 — End: 2021-03-10
  Administered 2021-03-10: 50 mg via INTRAVENOUS

## 2021-03-10 MED ORDER — LIDOCAINE HCL 2 % IJ SOLN
INTRAMUSCULAR | Status: AC
  Filled 2021-03-10: qty 20

## 2021-03-10 SURGICAL SUPPLY — 26 items
CABLE BIPOLOR RESECTION CORD (MISCELLANEOUS) ×3 IMPLANT
CANNULA VLV SOFT TIP 25GA (OPHTHALMIC) ×3 IMPLANT
CLSR STERI-STRIP ANTIMIC 1/2X4 (GAUZE/BANDAGES/DRESSINGS) ×3 IMPLANT
DRAPE HALF SHEET 40X57 (DRAPES) ×3 IMPLANT
DRAPE RETRACTOR (MISCELLANEOUS) ×3 IMPLANT
FORCEPS GRIESHABER ILM 25G A (INSTRUMENTS) ×3 IMPLANT
GLOVE SURG ENC MOIS LTX SZ7.5 (GLOVE) ×3 IMPLANT
GOWN STRL REUS W/ TWL LRG LVL3 (GOWN DISPOSABLE) ×4 IMPLANT
GOWN STRL REUS W/TWL LRG LVL3 (GOWN DISPOSABLE) ×6
HANDLE PNEUMATIC FOR CONSTEL (OPHTHALMIC) ×3 IMPLANT
KIT BASIN OR (CUSTOM PROCEDURE TRAY) ×3 IMPLANT
KIT TURNOVER KIT B (KITS) ×3 IMPLANT
LENS BIOM SUPER VIEW SET DISP (MISCELLANEOUS) ×3 IMPLANT
NEEDLE 18GX1X1/2 (RX/OR ONLY) (NEEDLE) ×3 IMPLANT
NEEDLE FILTER BLUNT 18X 1/2SAF (NEEDLE) ×1
NEEDLE FILTER BLUNT 18X1 1/2 (NEEDLE) ×2 IMPLANT
NEEDLE HYPO 30X.5 LL (NEEDLE) IMPLANT
NEEDLE RETROBULBAR 25GX1.5 (NEEDLE) ×3 IMPLANT
NS IRRIG 1000ML POUR BTL (IV SOLUTION) ×3 IMPLANT
PAD ARMBOARD 7.5X6 YLW CONV (MISCELLANEOUS) ×3 IMPLANT
PAK PIK VITRECTOMY CVS 25GA (OPHTHALMIC) ×3 IMPLANT
PROBE ENDO DIATHERMY 25G (MISCELLANEOUS) ×3 IMPLANT
PROBE LASER ILLUM FLEX CVD 25G (OPHTHALMIC) ×3 IMPLANT
SHIELD EYE LENSE ONLY DISP (GAUZE/BANDAGES/DRESSINGS) ×3 IMPLANT
STOCKINETTE IMPERVIOUS 9X36 MD (GAUZE/BANDAGES/DRESSINGS) ×6 IMPLANT
WATER STERILE IRR 1000ML POUR (IV SOLUTION) ×3 IMPLANT

## 2021-03-10 NOTE — Op Note (Signed)
Christina Orozco, ALICEA MEDICAL RECORD NO: [id number] ACCOUNT NO: [id number] DATE OF BIRTH: 12/23/1970 FACILITY: [facility] LOCATION: [facility] PHYSICIAN: Corene Cornea B. Baird Cancer, MD  Operative Report   DATE OF PROCEDURE: 03/10/2021  SURGEON:  Sherlynn Stalls, MD  PREOPERATIVE DIAGNOSES:  Diabetic vitreous hemorrhage with epiretinal membrane and proliferative diabetic retinopathy, all left eye.  POSTOPERATIVE DIAGNOSES:  Diabetic vitreous hemorrhage with epiretinal membrane and proliferative diabetic retinopathy, all left eye.  OPERATION:  Pars plana vitrectomy with membrane peeling of epiretinal membrane, endolaser photocoagulation, and intravitreal injection of 1.25 mg of Avastin.  COMPLICATIONS:  None.  FINDINGS:  There was advanced macular ischemia, nonperfusion. There was proliferative diabetic retinopathy present.  There was advanced vitreous hemorrhage present.  DESCRIPTION OF PROCEDURE:  The patient was identified in the preoperative holding area, a signed consent form was placed on the chart.  The left eye was marked and she was taken to the operating room where she was placed under general anesthesia.  At  that point in time, the left eye was anesthetized for postoperative pain and discomfort using a retrobulbar needle, consisting of a 1:1 mixture of 0.75% bupivacaine and 1% lidocaine.  Needle was removed.  Left eye was prepped and draped in the usual  sterile fashion for ocular surgery.  A Lieberman speculum was placed between the left upper and lower eyelids for exposure.  25-gauge trocars were used to introduce transconjunctival cannulas in the inferotemporal, superotemporal, and superonasal  quadrants.  Trocar was removed leaving the cannulas in place.  Infusion cannula was placed in the inferior temporal cannula and confirmed to be in the vitreous cavity prior to its use.  The eye then underwent a core vitrectomy with vitreous cutter and  light pipe.  Dense vitreous hemorrhage was removed at  that point in time.  Epiretinal membranes were then engaged and peeled using ILM forceps.  TissueBlue was used to identify the epiretinal tissue.  TissueBlue was injected into the vitreous cavity  using a 25-gauge needle, left on the macula for 25 seconds and then removed using the vitreous cutter prior to membrane peeling.  The contact lens was used to assist in magnification of the membrane peeling.  After this was completed, the contact lens  was removed.  A BIOM was used to perform the remaining part of the vitrectomy.  The vitreous was trimmed out to the periphery.  Hemostasis was obtained with bipolar cautery.  Next, endolaser photocoagulation was placed in a panretinal pattern.  Laser was  removed.  The eye was inspected with scleral depression.  No retinal tears or detachments were seen by careful exam.  The eye was then injected with 1.25 mg of Avastin through one of the existing cannulas.  The needle was removed and withdrawn and  discarded.  At that time, the cannula was removed from the sclerotomies.  Sclerotomies were inspected and found to be watertight.  The eye was then treated with subconjunctival injections of 15 mg of vancomycin and 1 mg of dexamethasone.  Eye was treated  topically with 1 drop of 1% atropine and TobraDex ointment.  Speculum was removed.  Eyelids were cleaned and closed.  They were then patched and shielded.  The eye was then stable and the patient was taken to recovery in excellent condition, having  tolerated the procedure well.   SHW D: 03/10/2021 9:34:32 am T: 03/10/2021 10:12:00 am  JOB: [id number]

## 2021-03-10 NOTE — Anesthesia Procedure Notes (Signed)
Procedure Name: Intubation Date/Time: 03/10/2021 7:45 AM Performed by: Minerva Ends, CRNA Pre-anesthesia Checklist: Patient identified, Emergency Drugs available, Suction available and Patient being monitored Patient Re-evaluated:Patient Re-evaluated prior to induction Oxygen Delivery Method: Circle system utilized Preoxygenation: Pre-oxygenation with 100% oxygen Induction Type: IV induction Ventilation: Mask ventilation without difficulty Laryngoscope Size: Mac and 3 Grade View: Grade II Tube type: Oral Tube size: 7.0 mm Number of attempts: 1 Airway Equipment and Method: Stylet and Oral airway Placement Confirmation: ETT inserted through vocal cords under direct vision, positive ETCO2 and breath sounds checked- equal and bilateral Secured at: 22 cm Tube secured with: Tape Dental Injury: Teeth and Oropharynx as per pre-operative assessment

## 2021-03-10 NOTE — H&P (Signed)
Christina Orozco is an 50 y.o. female.   Chief Complaint: vision loss OS HPI: Dx with PDR/VH OS  Past Medical History:  Diagnosis Date   Anemia    severe   Anxiety    Asthma    CKD (chronic kidney disease)    stage IV   DDD (degenerative disc disease), cervical    Depression    Diabetes mellitus (HCC)    Dislocated shoulder    right   GERD (gastroesophageal reflux disease)    Headache    migraines (about once a month)   History of kidney stones    Hypertension    Neuropathy    Peripheral vascular disease ([place])    Pneumonia    Stroke [facility])     Past Surgical History:  Procedure Laterality Date   ADENOIDECTOMY     APPENDECTOMY     CERVICAL SPINE SURGERY  06/2012   C5-C6   EYE SURGERY     left eye surgery    FOOT SURGERY     LOOP RECORDER INSERTION N/A 08/27/2017   Procedure: LOOP RECORDER INSERTION;  Surgeon: Sanda Klein, MD;  Location: [facility];  Service: Cardiovascular;  Laterality: N/A;   SHOULDER SURGERY     TEE WITHOUT CARDIOVERSION N/A 08/27/2017   Procedure: TRANSESOPHAGEAL ECHOCARDIOGRAM (TEE);  Surgeon: Sanda Klein, MD;  Location: [facility] ENDOSCOPY;  Service: Cardiovascular;  Laterality: N/A;   TONSILLECTOMY      Family History  Problem Relation Age of Onset   Vascular Disease Mother    CAD Mother    Heart failure Mother    Heart disease Other    Cancer Other        colon, stomach, pancreatic, lung   Diabetes Other    Breast cancer Sister 30   Seizures Maternal Uncle    Social History:  reports that she has never smoked. She has never used smokeless tobacco. She reports that she does not drink alcohol and does not use drugs.  Allergies:  Allergies  Allergen Reactions   Lisinopril Swelling and Other (See Comments)    Facial swelling    Penicillins Hives    Has patient had a PCN reaction causing immediate rash, facial/tongue/throat swelling, SOB or lightheadedness with hypotension: yes Has patient had a PCN reaction causing severe rash  involving mucus membranes or skin necrosis: No Has patient had a PCN reaction that required hospitalization No Has patient had a PCN reaction occurring within the last 10 years: No If all of the above answers are "NO", then may proceed wit   Gabapentin Swelling and Other (See Comments)    "I thought I was having another stroke."     Facility-Administered Medications Prior to Admission  Medication Dose Route Frequency Provider Last Rate Last Admin   lidocaine-EPINEPHrine (XYLOCAINE W/EPI) 1 %-1:100000 (with pres) injection 10 mL  10 mL Infiltration Once Croitoru, Mihai, MD       Medications Prior to Admission  Medication Sig Dispense Refill   amLODipine (NORVASC) 10 MG tablet Take 1 tablet (10 mg total) by mouth daily. 90 tablet 3   aspirin 81 MG EC tablet Take 81 mg by mouth daily.     atorvastatin (LIPITOR) 80 MG tablet Take 80 mg by mouth at bedtime.     carvedilol (COREG) 12.5 MG tablet Take 12.5 mg by mouth 2 (two) times daily with a meal.     Cholecalciferol (VITAMIN D3 PO) Take 2,000 mg by mouth daily.     clopidogrel (PLAVIX) 75 MG  tablet Take 1 tablet (75 mg total) by mouth daily. 90 tablet 3   diclofenac Sodium (VOLTAREN) 1 % GEL Apply 1 application topically daily as needed (pain).     DULoxetine (CYMBALTA) 60 MG capsule Take 60 mg by mouth at bedtime.     ferrous sulfate 325 (65 FE) MG EC tablet Take 325 mg by mouth 2 (two) times daily.     furosemide (LASIX) 20 MG tablet Take 20 mg by mouth daily.     hydrALAZINE (APRESOLINE) 25 MG tablet Take 1 tablet (25 mg total) by mouth in the morning and at bedtime. 270 tablet 3   levETIRAcetam (KEPPRA) 500 MG tablet TAKE 1 TABLET(500 MG) BY MOUTH TWICE DAILY (Patient taking differently: Take 500 mg by mouth 2 (two) times daily. TAKE 1 TABLET(500 MG) BY MOUTH TWICE DAILY) 60 tablet 5   pantoprazole (PROTONIX) 40 MG tablet TAKE 1 TABLET(40 MG) BY MOUTH DAILY (Patient taking differently: Take 40 mg by mouth daily.) 30 tablet 5    Semaglutide,0.25 or 0.'5MG'$ /DOS, (OZEMPIC, 0.25 OR 0.5 MG/DOSE,) 2 MG/1.5ML SOPN Inject 0.5 mg into the skin every Sunday.     albuterol (PROAIR HFA) 108 (90 Base) MCG/ACT inhaler INHALE 1 OR 2 PUFFS INTO THE LUNGS EVERY 6 HOURS AS NEEDED FOR WHEEZING OR SHORTNESS OF BREATH (Patient taking differently: Inhale 1-2 puffs into the lungs every 6 (six) hours as needed for shortness of breath or wheezing.) 8.5 g 0   glucose blood (CONTOUR NEXT TEST) test strip 1 each by Other route 2 (two) times daily. And lancets 2/day (Patient taking differently: 1 each by Other route 2 (two) times daily.) 100 each 12   meclizine (ANTIVERT) 25 MG tablet Take 1 tablet (25 mg total) by mouth 3 (three) times daily as needed for dizziness. (Patient not taking: No sig reported) 20 tablet 0   topiramate (TOPAMAX) 25 MG tablet Take 50 mg by mouth at bedtime.      Results for orders placed or performed during the hospital encounter of 03/10/21 (from the past 48 hour(s))  Glucose, capillary     Status: Abnormal   Collection Time: 03/10/21  6:55 AM  Result Value Ref Range   Glucose-Capillary 151 (H) 70 - 99 mg/dL    Comment: Glucose reference range applies only to samples taken after fasting for at least 8 hours.   No results found.  Review of Systems  Eyes:  Positive for visual disturbance.  All other systems reviewed and are negative.  Blood pressure (!) 193/90, pulse 81, temperature 98.1 F (36.7 C), temperature source Oral, resp. rate 18, height '5\' 2"'$  (1.575 m), weight 64.9 kg, last menstrual period 05/22/2018, SpO2 99 %. Physical Exam Constitutional:      Appearance: Normal appearance. She is normal weight.  Eyes:     General: Lids are normal.     Extraocular Movements: Extraocular movements intact.     Conjunctiva/sclera: Conjunctivae normal.     Funduscopic exam:       Left eye: Hemorrhage present.   Neurological:     Mental Status: She is alert.     Assessment/Plan PDR/VH OS: PPV/EC/EL/Avastin  OS  Corliss Parish, MD 03/10/2021, 7:11 AM

## 2021-03-10 NOTE — Anesthesia Postprocedure Evaluation (Signed)
Anesthesia Post Note  Patient: [name]  Procedure(s) Performed: TWENTY-FIVE GAUGE PARS PLANA VITRECTOMY (Left: Eye) MEMBRANE PEEL (Left: Eye) Intravitreal Avastin Injection (Left: Eye) PHOTOCOAGULATION WITH LASER (Left: Eye)     Patient location during evaluation: PACU Anesthesia Type: General Level of consciousness: awake and alert Pain management: pain level controlled Vital Signs Assessment: post-procedure vital signs reviewed and stable Respiratory status: spontaneous breathing, nonlabored ventilation, respiratory function stable and patient connected to nasal cannula oxygen Cardiovascular status: blood pressure returned to baseline and stable Postop Assessment: no apparent nausea or vomiting Anesthetic complications: no   No notable events documented.  Last Vitals:  Vitals:   03/10/21 0943 03/10/21 1000  BP: 115/72 120/74  Pulse: 75 77  Resp: 20 18  Temp:  36.7 C  SpO2: 99% 100%    Last Pain:  Vitals:   03/10/21 1000  TempSrc:   PainSc: 0-No pain                 Belenda Cruise P Diya Gervasi

## 2021-03-10 NOTE — Transfer of Care (Signed)
Immediate Anesthesia Transfer of Care Note  Patient: [name]  Procedure(s) Performed: TWENTY-FIVE GAUGE PARS PLANA VITRECTOMY (Left: Eye) MEMBRANE PEEL (Left: Eye) Intravitreal Avastin Injection (Left: Eye) PHOTOCOAGULATION WITH LASER (Left: Eye)  Patient Location: PACU  Anesthesia Type:General  Level of Consciousness: awake, alert , oriented and patient cooperative  Airway & Oxygen Therapy: Patient Spontanous Breathing and Patient connected to face mask oxygen  Post-op Assessment: Report given to RN and Post -op Vital signs reviewed and stable  Post vital signs: Reviewed and stable  Last Vitals:  Vitals Value Taken Time  BP 153/84 03/10/21 0928  Temp    Pulse 71 03/10/21 0934  Resp 7 03/10/21 0934  SpO2 95 % 03/10/21 0934  Vitals shown include unvalidated device data.  Last Pain:  Vitals:   03/10/21 0713  TempSrc:   PainSc: 8       Patients Stated Pain Goal: 3 ([id number])  Complications: No notable events documented.

## 2021-03-10 NOTE — Brief Op Note (Signed)
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  PRE-OPERATIVE DIAGNOSIS:  Proliferative diabetic retinopathy, vitreous hemorrhage left eye  POST-OPERATIVE DIAGNOSIS:  Proliferative diabetic retinopathy, vitreous hemorrhage left eye  PROCEDURE:  Procedure(s): TWENTY-FIVE GAUGE PARS PLANA VITRECTOMY (Left) MEMBRANE PEEL (Left) Intravitreal Avastin Injection (Left) PHOTOCOAGULATION WITH LASER (Left)  SURGEON:  Surgeon(s) and Role:    Sherlynn Stalls, MD - Primary  PHYSICIAN ASSISTANT:   ASSISTANTS: none   ANESTHESIA:   general  EBL:  minimal  BLOOD ADMINISTERED:none  DRAINS: none   LOCAL MEDICATIONS USED:  BUPIVICAINE   SPECIMEN:  No Specimen  DISPOSITION OF SPECIMEN:  N/A  COUNTS:  YES  TOURNIQUET:  * No tourniquets in log *  DICTATION: .Other Dictation: Dictation Number [id number]  PLAN OF CARE: Discharge to home after PACU  PATIENT DISPOSITION:  PACU - hemodynamically stable.   Delay start of Pharmacological VTE agent (>24hrs) due to surgical blood loss or risk of bleeding: not applicable

## 2021-03-11 ENCOUNTER — Encounter (HOSPITAL_COMMUNITY): Payer: Self-pay | Admitting: Ophthalmology

## 2021-03-15 ENCOUNTER — Encounter: Payer: Medicaid Other | Admitting: Physical Therapy

## 2021-03-22 ENCOUNTER — Encounter: Payer: Medicaid Other | Admitting: Physical Therapy

## 2021-04-21 ENCOUNTER — Encounter (HOSPITAL_BASED_OUTPATIENT_CLINIC_OR_DEPARTMENT_OTHER): Payer: Self-pay

## 2021-05-31 ENCOUNTER — Other Ambulatory Visit: Payer: Self-pay

## 2021-05-31 ENCOUNTER — Emergency Department (HOSPITAL_COMMUNITY)
Admission: EM | Admit: 2021-05-31 | Discharge: 2021-05-31 | Disposition: A | Discharge status: Home / Self Care | Payer: Medicaid Other | Attending: Emergency Medicine | Admitting: Emergency Medicine

## 2021-05-31 ENCOUNTER — Encounter (HOSPITAL_COMMUNITY): Payer: Self-pay | Admitting: Emergency Medicine

## 2021-05-31 DIAGNOSIS — N184 Chronic kidney disease, stage 4 (severe): Secondary | ICD-10-CM | POA: Insufficient documentation

## 2021-05-31 DIAGNOSIS — Z79899 Other long term (current) drug therapy: Secondary | ICD-10-CM | POA: Insufficient documentation

## 2021-05-31 DIAGNOSIS — Z7984 Long term (current) use of oral hypoglycemic drugs: Secondary | ICD-10-CM | POA: Insufficient documentation

## 2021-05-31 DIAGNOSIS — J4520 Mild intermittent asthma, uncomplicated: Secondary | ICD-10-CM | POA: Diagnosis not present

## 2021-05-31 DIAGNOSIS — Z7982 Long term (current) use of aspirin: Secondary | ICD-10-CM | POA: Diagnosis not present

## 2021-05-31 DIAGNOSIS — E1122 Type 2 diabetes mellitus with diabetic chronic kidney disease: Secondary | ICD-10-CM | POA: Diagnosis not present

## 2021-05-31 DIAGNOSIS — R2233 Localized swelling, mass and lump, upper limb, bilateral: Secondary | ICD-10-CM | POA: Diagnosis not present

## 2021-05-31 DIAGNOSIS — I129 Hypertensive chronic kidney disease with stage 1 through stage 4 chronic kidney disease, or unspecified chronic kidney disease: Secondary | ICD-10-CM | POA: Insufficient documentation

## 2021-05-31 DIAGNOSIS — R229 Localized swelling, mass and lump, unspecified: Secondary | ICD-10-CM

## 2021-05-31 NOTE — Discharge Instructions (Addendum)
Get help right away if: You develop tingling or numbness in an area near the lipoma. This could indicate that the lipoma is causing nerve damage.

## 2021-05-31 NOTE — ED Triage Notes (Signed)
Pt reports that she believes she has a moving blood clot. Pt reports she has pain in her lower right calf yesterday and the pain has been moving up her leg today.

## 2021-05-31 NOTE — ED Provider Notes (Signed)
[facility] DEPT Provider Note   CSN: [id number] Arrival date & time: 05/31/21  1610     History Chief Complaint  Patient presents with   Leg Pain    Christina Orozco is a 50 y.o. female painful knot in her hip.  She states she first noticed it down by her leg and feels like it has migrated up to behind the right trochanter.  Patient was concerned she may have a blood clot.  She called her doctor's office who sent her to urgent care who then sent her to the emergency department to rule out a blood clot.  She is on blood thinners.  She denies heaviness, weakness, shortness of breath.  Patient has a history of CVA   Leg Pain     Past Medical History:  Diagnosis Date   Anemia    severe   Anxiety    Asthma    CKD (chronic kidney disease)    stage IV   DDD (degenerative disc disease), cervical    Depression    Diabetes mellitus ([place])    Dislocated shoulder    right   GERD (gastroesophageal reflux disease)    Headache    migraines (about once a month)   History of kidney stones    Hypertension    Neuropathy    Peripheral vascular disease ([place])    Pneumonia    Stroke [facility])     Patient Active Problem List   Diagnosis Date Noted   Depression 07/05/2020   CVA (cerebral vascular accident) ([place]) 06/21/2020   CVA (cerebrovascular accident) ([place]) 06/19/2020   Abnormality of gait 10/14/2018   Dizziness and giddiness 09/13/2018   Debility 06/26/2018   Chronic nonintractable headache 18/84/1660   Diastolic dysfunction    Congestion of nasal sinus    Pneumonia of left lower lobe due to infectious organism    SOB (shortness of breath)    Candidiasis    CKD (chronic kidney disease) stage 4, GFR 15-29 ml/min (HCC)    Acute on chronic anemia    Hypoalbuminemia due to protein-calorie malnutrition (HCC)    Essential hypertension    Non-insulin dependent type 2 diabetes mellitus ([place])    PRES (posterior reversible encephalopathy syndrome)     Hypokalemia    History of CVA with residual deficit    History of seizure 03/25/2018   Bilateral carpal tunnel syndrome 02/14/2018   Arthritis 10/31/2017   Asthma 10/31/2017   Constipation 10/31/2017   Kidney stone 10/31/2017   Anemia of chronic disease 08/09/2017   Hyperlipidemia 07/26/2017   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   Anxiety as acute reaction to exceptional stress 04/23/2017   Insomnia 10/08/2016   Neuropathy 06/07/2016   Gait instability    Lesion of pons    Slurred speech    Dizziness 04/30/2016   Hyperglycemia 04/30/2016   Chronic kidney disease (CKD), stage III (moderate) ([place]) 04/30/2016   Ataxia 04/30/2016   Chronic right shoulder pain 04/03/2016   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   Rhinitis, allergic 08/25/2015   Asthma, mild intermittent 07/14/2015   Gastroesophageal reflux disease without esophagitis 06/08/2015   Hypertensive cardiovascular disease 05/05/2015   Vitamin D deficiency 05/05/2015    Past Surgical History:  Procedure Laterality Date   ADENOIDECTOMY     APPENDECTOMY     CERVICAL SPINE SURGERY  06/2012   C5-C6   EYE SURGERY     left eye surgery    FOOT SURGERY     KENALOG INJECTION Left 03/10/2021  Procedure: Intravitreal Avastin Injection;  Surgeon: Sherlynn Stalls, MD;  Location: [facility];  Service: Ophthalmology;  Laterality: Left;   LOOP RECORDER INSERTION N/A 08/27/2017   Procedure: LOOP RECORDER INSERTION;  Surgeon: Sanda Klein, MD;  Location: [facility];  Service: Cardiovascular;  Laterality: N/A;   MEMBRANE PEEL Left 03/10/2021   Procedure: MEMBRANE PEEL;  Surgeon: Sherlynn Stalls, MD;  Location: [facility];  Service: Ophthalmology;  Laterality: Left;   PARS PLANA VITRECTOMY Left 03/10/2021   Procedure: TWENTY-FIVE GAUGE PARS PLANA VITRECTOMY;  Surgeon: Sherlynn Stalls, MD;  Location: [facility];  Service: Ophthalmology;  Laterality: Left;   PHOTOCOAGULATION WITH LASER Left 03/10/2021   Procedure: PHOTOCOAGULATION WITH LASER;   Surgeon: Sherlynn Stalls, MD;  Location: [facility];  Service: Ophthalmology;  Laterality: Left;   SHOULDER SURGERY     TEE WITHOUT CARDIOVERSION N/A 08/27/2017   Procedure: TRANSESOPHAGEAL ECHOCARDIOGRAM (TEE);  Surgeon: Sanda Klein, MD;  Location: [facility] ENDOSCOPY;  Service: Cardiovascular;  Laterality: N/A;   TONSILLECTOMY       OB History   No obstetric history on file.     Family History  Problem Relation Age of Onset   Vascular Disease Mother    CAD Mother    Heart failure Mother    Heart disease Other    Cancer Other        colon, stomach, pancreatic, lung   Diabetes Other    Breast cancer Sister 50   Seizures Maternal Uncle     Social History   Tobacco Use   Smoking status: Never   Smokeless tobacco: Never  Vaping Use   Vaping Use: Never used  Substance Use Topics   Alcohol use: No   Drug use: No    Home Medications Prior to Admission medications   Medication Sig Start Date End Date Taking? Authorizing Provider  albuterol (PROAIR HFA) 108 (90 Base) MCG/ACT inhaler INHALE 1 OR 2 PUFFS INTO THE LUNGS EVERY 6 HOURS AS NEEDED FOR WHEEZING OR SHORTNESS OF BREATH Patient taking differently: Inhale 1-2 puffs into the lungs every 6 (six) hours as needed for shortness of breath or wheezing. 04/12/18   Angiulli, Lavon Paganini, PA-C  amLODipine (NORVASC) 10 MG tablet Take 1 tablet (10 mg total) by mouth daily. 01/11/21   Skeet Latch, MD  aspirin 81 MG EC tablet Take 81 mg by mouth daily.    [provider]  atorvastatin (LIPITOR) 80 MG tablet Take 80 mg by mouth at bedtime. 02/25/20   [provider]  carvedilol (COREG) 12.5 MG tablet Take 12.5 mg by mouth 2 (two) times daily with a meal.    [provider]  Cholecalciferol (VITAMIN D3 PO) Take 2,000 mg by mouth daily.    [provider]  clopidogrel (PLAVIX) 75 MG tablet Take 1 tablet (75 mg total) by mouth daily. 01/11/21   Skeet Latch, MD  diclofenac Sodium (VOLTAREN) 1 % GEL Apply 1  application topically daily as needed (pain).    [provider]  DULoxetine (CYMBALTA) 60 MG capsule Take 60 mg by mouth at bedtime.    [provider]  ferrous sulfate 325 (65 FE) MG EC tablet Take 325 mg by mouth 2 (two) times daily.    [provider]  furosemide (LASIX) 20 MG tablet Take 20 mg by mouth daily. 11/20/19   [provider]  glucose blood (CONTOUR NEXT TEST) test strip 1 each by Other route 2 (two) times daily. And lancets 2/day Patient taking differently: 1 each by  Other route 2 (two) times daily. 04/12/18   Angiulli, Lavon Paganini, PA-C  hydrALAZINE (APRESOLINE) 25 MG tablet Take 1 tablet (25 mg total) by mouth in the morning and at bedtime. 01/28/21   Loel Dubonnet, NP  levETIRAcetam (KEPPRA) 500 MG tablet TAKE 1 TABLET(500 MG) BY MOUTH TWICE DAILY Patient taking differently: Take 500 mg by mouth 2 (two) times daily. TAKE 1 TABLET(500 MG) BY MOUTH TWICE DAILY 07/15/18   Lanae Boast, FNP  meclizine (ANTIVERT) 25 MG tablet Take 1 tablet (25 mg total) by mouth 3 (three) times daily as needed for dizziness. Patient not taking: No sig reported 01/16/21   Veryl Speak, MD  pantoprazole (PROTONIX) 40 MG tablet TAKE 1 TABLET(40 MG) BY MOUTH DAILY Patient taking differently: Take 40 mg by mouth daily. 04/23/19   Tresa Garter, MD  Semaglutide,0.25 or 0.5MG /DOS, (OZEMPIC, 0.25 OR 0.5 MG/DOSE,) 2 MG/1.5ML SOPN Inject 0.5 mg into the skin every Sunday. 02/27/20   [provider]  topiramate (TOPAMAX) 25 MG tablet Take 50 mg by mouth at bedtime. 05/12/20   [provider]    Allergies    Lisinopril, Penicillins, and Gabapentin  Review of Systems   Review of Systems Ten systems reviewed and are negative for acute change, except as noted in the HPI.   Physical Exam Updated Vital Signs LMP 05/22/2018 (Within Days)   Physical Exam Vitals and nursing note reviewed.  Constitutional:      General: She is not in acute distress.     Appearance: She is well-developed. She is not diaphoretic.  HENT:     Head: Normocephalic and atraumatic.     Right Ear: External ear normal.     Left Ear: External ear normal.     Nose: Nose normal.     Mouth/Throat:     Mouth: Mucous membranes are moist.  Eyes:     General: No scleral icterus.    Conjunctiva/sclera: Conjunctivae normal.  Cardiovascular:     Rate and Rhythm: Normal rate and regular rhythm.     Heart sounds: Normal heart sounds. No murmur heard.   No friction rub. No gallop.  Pulmonary:     Effort: Pulmonary effort is normal. No respiratory distress.     Breath sounds: Normal breath sounds.  Abdominal:     General: Bowel sounds are normal. There is no distension.     Palpations: Abdomen is soft. There is no mass.     Tenderness: There is no abdominal tenderness. There is no guarding.  Musculoskeletal:     Cervical back: Normal range of motion.     Right lower leg: No edema.     Left lower leg: No edema.  Skin:    General: Skin is warm and dry.     Comments: There is a mobile, tender, subcutaneous mass just posterior to the left trochanter.  Tender to palpation.  Neurological:     Mental Status: She is alert and oriented to person, place, and time.  Psychiatric:        Behavior: Behavior normal.    ED Results / Procedures / Treatments   Labs (all labs ordered are listed, but only abnormal results are displayed) Labs Reviewed - No data to display  EKG None  Radiology No results found.  Procedures Procedures   Medications Ordered in ED Medications - No data to display  ED Course  I have reviewed the triage vital signs and the nursing notes.  Pertinent labs & imaging results that were  available during my care of the patient were reviewed by me and considered in my medical decision making (see chart for details).    MDM Rules/Calculators/A&P                         Patient with mobile, tender mass in the subcutaneous tissues behind the right  trochanter consistent with lipoma.  She has equal and symmetric legs.  She has no tenderness, no swelling no shortness of breath.   clinically there is absolutely no evidence of a blood clot and I do not think that the patient needs a DVT study.  Patient will be discharged to follow-up with her PCP.  Final Clinical Impression(s) / ED Diagnoses Final diagnoses:  Subcutaneous nodule    Rx / DC Orders ED Discharge Orders     None        [name], PA-C 05/31/21 1641    Gareth Morgan, MD 06/01/21 1550

## 2021-06-24 ENCOUNTER — Other Ambulatory Visit: Payer: Self-pay

## 2021-06-24 ENCOUNTER — Emergency Department (HOSPITAL_COMMUNITY)
Admission: EM | Admit: 2021-06-24 | Discharge: 2021-06-24 | Disposition: A | Discharge status: Home / Self Care | Payer: Medicaid Other | Attending: Emergency Medicine | Admitting: Emergency Medicine

## 2021-06-24 ENCOUNTER — Emergency Department (HOSPITAL_COMMUNITY): Payer: Medicaid Other

## 2021-06-24 DIAGNOSIS — E1122 Type 2 diabetes mellitus with diabetic chronic kidney disease: Secondary | ICD-10-CM | POA: Insufficient documentation

## 2021-06-24 DIAGNOSIS — Z7902 Long term (current) use of antithrombotics/antiplatelets: Secondary | ICD-10-CM | POA: Insufficient documentation

## 2021-06-24 DIAGNOSIS — K5900 Constipation, unspecified: Secondary | ICD-10-CM | POA: Diagnosis not present

## 2021-06-24 DIAGNOSIS — Z7982 Long term (current) use of aspirin: Secondary | ICD-10-CM | POA: Diagnosis not present

## 2021-06-24 DIAGNOSIS — Z79899 Other long term (current) drug therapy: Secondary | ICD-10-CM | POA: Diagnosis not present

## 2021-06-24 DIAGNOSIS — N189 Chronic kidney disease, unspecified: Secondary | ICD-10-CM | POA: Diagnosis not present

## 2021-06-24 LAB — COMPREHENSIVE METABOLIC PANEL
ALT: 12 U/L (ref 0–44)
AST: 14 U/L — ABNORMAL LOW (ref 15–41)
Albumin: 4 g/dL (ref 3.5–5.0)
Alkaline Phosphatase: 47 U/L (ref 38–126)
Anion gap: 7 (ref 5–15)
BUN: 33 mg/dL — ABNORMAL HIGH (ref 6–20)
CO2: 23 mmol/L (ref 22–32)
Calcium: 10 mg/dL (ref 8.9–10.3)
Chloride: 110 mmol/L (ref 98–111)
Creatinine, Ser: 2.65 mg/dL — ABNORMAL HIGH (ref 0.44–1.00)
GFR, Estimated: 22 mL/min — ABNORMAL LOW (ref >60)
Glucose, Bld: 91 mg/dL (ref 70–99)
Potassium: 3.7 mmol/L (ref 3.5–5.1)
Sodium: 140 mmol/L (ref 135–145)
Total Bilirubin: 0.5 mg/dL (ref 0.3–1.2)
Total Protein: 7.6 g/dL (ref 6.5–8.1)

## 2021-06-24 LAB — CBC WITH DIFFERENTIAL/PLATELET
Abs Immature Granulocytes: 0.01 10*3/uL (ref 0.00–0.07)
Basophils Absolute: 0 10*3/uL (ref 0.0–0.1)
Basophils Relative: 0 %
Eosinophils Absolute: 0.1 10*3/uL (ref 0.0–0.5)
Eosinophils Relative: 2 %
HCT: 31.3 % — ABNORMAL LOW (ref 36.0–46.0)
Hemoglobin: 10.3 g/dL — ABNORMAL LOW (ref 12.0–15.0)
Immature Granulocytes: 0 %
Lymphocytes Relative: 47 %
Lymphs Abs: 2.3 10*3/uL (ref 0.7–4.0)
MCH: 29.2 pg (ref 26.0–34.0)
MCHC: 32.9 g/dL (ref 30.0–36.0)
MCV: 88.7 fL (ref 80.0–100.0)
Monocytes Absolute: 0.2 10*3/uL (ref 0.1–1.0)
Monocytes Relative: 5 %
Neutro Abs: 2.2 10*3/uL (ref 1.7–7.7)
Neutrophils Relative %: 46 %
Platelets: 328 10*3/uL (ref 150–400)
RBC: 3.53 MIL/uL — ABNORMAL LOW (ref 3.87–5.11)
RDW: 12.8 % (ref 11.5–15.5)
WBC: 4.9 10*3/uL (ref 4.0–10.5)
nRBC: 0 % (ref 0.0–0.2)

## 2021-06-24 LAB — LIPASE, BLOOD: Lipase: 43 U/L (ref 11–51)

## 2021-06-24 LAB — URINALYSIS, ROUTINE W REFLEX MICROSCOPIC
Bilirubin Urine: NEGATIVE
Glucose, UA: NEGATIVE mg/dL
Hgb urine dipstick: NEGATIVE
Ketones, ur: NEGATIVE mg/dL
Nitrite: NEGATIVE
Protein, ur: >=300 mg/dL — AB
Specific Gravity, Urine: 1.013 (ref 1.005–1.030)
pH: 5 (ref 5.0–8.0)

## 2021-06-24 LAB — I-STAT BETA HCG BLOOD, ED (MC, WL, AP ONLY): I-stat hCG, quantitative: <5 m[IU]/mL (ref <5)

## 2021-06-24 MED ORDER — BISACODYL 5 MG PO TBEC
10.0000 mg | DELAYED_RELEASE_TABLET | Freq: Once | ORAL | Status: AC
  Administered 2021-06-24: 10 mg via ORAL
  Filled 2021-06-24: qty 2

## 2021-06-24 MED ORDER — FLEET ENEMA 7-19 GM/118ML RE ENEM: 1.0000 | ENEMA | Freq: Once | RECTAL | 0 refills | Status: AC

## 2021-06-24 MED ORDER — GLYCERIN (LAXATIVE) 2 G RE SUPP
1.0000 | Freq: Once | RECTAL | Status: AC
  Administered 2021-06-24: 1 via RECTAL
  Filled 2021-06-24: qty 1

## 2021-06-24 NOTE — ED Provider Notes (Signed)
[facility] DEPT Provider Note   CSN: [id number] Arrival date & time: 06/24/21  1549     History  Chief Complaint  Patient presents with   Constipation    Christina Orozco is a 51 y.o. female.  50 y.o. female with complaint of constipation and abdominal pain x 2 weeks, last emesis a few days ago smelled of feces. Abdominal pain is cramping in nature, generalized. No relief with 1 dose of Miralax at home. Denies fevers, changes in bladder habits.       Home Medications Prior to Admission medications   Medication Sig Start Date End Date Taking? Authorizing Provider  sodium phosphate (FLEET) 7-19 GM/118ML ENEM Place 133 mLs (1 enema total) rectally once for 1 dose. 06/24/21 06/24/21 Yes Tacy Learn, PA-C  albuterol (PROAIR HFA) 108 (90 Base) MCG/ACT inhaler INHALE 1 OR 2 PUFFS INTO THE LUNGS EVERY 6 HOURS AS NEEDED FOR WHEEZING OR SHORTNESS OF BREATH Patient taking differently: Inhale 1-2 puffs into the lungs every 6 (six) hours as needed for shortness of breath or wheezing. 04/12/18   Angiulli, Lavon Paganini, PA-C  amLODipine (NORVASC) 10 MG tablet Take 1 tablet (10 mg total) by mouth daily. 01/11/21   Skeet Latch, MD  aspirin 81 MG EC tablet Take 81 mg by mouth daily.    [provider]  atorvastatin (LIPITOR) 80 MG tablet Take 80 mg by mouth at bedtime. 02/25/20   [provider]  carvedilol (COREG) 12.5 MG tablet Take 12.5 mg by mouth 2 (two) times daily with a meal.    [provider]  Cholecalciferol (VITAMIN D3 PO) Take 2,000 mg by mouth daily.    [provider]  clopidogrel (PLAVIX) 75 MG tablet Take 1 tablet (75 mg total) by mouth daily. 01/11/21   Skeet Latch, MD  diclofenac Sodium (VOLTAREN) 1 % GEL Apply 1 application topically daily as needed (pain).    [provider]  DULoxetine (CYMBALTA) 60 MG capsule Take 60 mg by mouth at bedtime.    [provider]  ferrous sulfate 325 (65 FE)  MG EC tablet Take 325 mg by mouth 2 (two) times daily.    [provider]  furosemide (LASIX) 20 MG tablet Take 20 mg by mouth daily. 11/20/19   [provider]  glucose blood (CONTOUR NEXT TEST) test strip 1 each by Other route 2 (two) times daily. And lancets 2/day Patient taking differently: 1 each by Other route 2 (two) times daily. 04/12/18   Angiulli, Lavon Paganini, PA-C  hydrALAZINE (APRESOLINE) 25 MG tablet Take 1 tablet (25 mg total) by mouth in the morning and at bedtime. 01/28/21   Loel Dubonnet, NP  levETIRAcetam (KEPPRA) 500 MG tablet TAKE 1 TABLET(500 MG) BY MOUTH TWICE DAILY Patient taking differently: Take 500 mg by mouth 2 (two) times daily. TAKE 1 TABLET(500 MG) BY MOUTH TWICE DAILY 07/15/18   Lanae Boast, FNP  meclizine (ANTIVERT) 25 MG tablet Take 1 tablet (25 mg total) by mouth 3 (three) times daily as needed for dizziness. Patient not taking: No sig reported 01/16/21   Veryl Speak, MD  pantoprazole (PROTONIX) 40 MG tablet TAKE 1 TABLET(40 MG) BY MOUTH DAILY Patient taking differently: Take 40 mg by mouth daily. 04/23/19   Tresa Garter, MD  Semaglutide,0.25 or 0.5MG /DOS, (OZEMPIC, 0.25 OR 0.5 MG/DOSE,) 2 MG/1.5ML SOPN Inject 0.5 mg into the skin every Sunday. 02/27/20   [provider]  topiramate (TOPAMAX) 25 MG tablet Take 50 mg by  mouth at bedtime. 05/12/20   [provider]      Allergies    Lisinopril, Penicillins, and Gabapentin    Review of Systems   Review of Systems  Constitutional:  Negative for fever.  Respiratory:  Negative for shortness of breath.   Cardiovascular:  Negative for chest pain.  Gastrointestinal:  Positive for abdominal pain, constipation, nausea and vomiting. Negative for blood in stool and diarrhea.  Genitourinary:  Negative for dysuria.  Musculoskeletal:  Negative for arthralgias and myalgias.  Skin:  Negative for rash and wound.  Allergic/Immunologic: Positive for immunocompromised state.   Neurological:  Negative for weakness.  Psychiatric/Behavioral:  Negative for confusion.   All other systems reviewed and are negative.  Physical Exam Updated Vital Signs BP (!) 150/93    Pulse 87    Temp 97.9 F (36.6 C) (Oral)    Resp 18    Ht 5\' 2"  (1.575 m)    Wt 61.2 kg    LMP 05/22/2018 (Within Days)    SpO2 100%    BMI 24.69 kg/m  Physical Exam Vitals and nursing note reviewed.  Constitutional:      General: She is not in acute distress.    Appearance: She is well-developed. She is not diaphoretic.  HENT:     Head: Normocephalic and atraumatic.     Mouth/Throat:     Mouth: Mucous membranes are moist.  Eyes:     Conjunctiva/sclera: Conjunctivae normal.  Cardiovascular:     Rate and Rhythm: Normal rate and regular rhythm.     Heart sounds: Normal heart sounds.  Pulmonary:     Effort: Pulmonary effort is normal.     Breath sounds: Normal breath sounds.  Abdominal:     General: Bowel sounds are normal.     Palpations: Abdomen is soft.     Tenderness: There is generalized abdominal tenderness. There is no right CVA tenderness or left CVA tenderness.  Skin:    General: Skin is warm and dry.     Findings: No erythema or rash.  Neurological:     Mental Status: She is alert and oriented to person, place, and time.  Psychiatric:        Behavior: Behavior normal.    ED Results / Procedures / Treatments   Labs (all labs ordered are listed, but only abnormal results are displayed) Labs Reviewed  CBC WITH DIFFERENTIAL/PLATELET - Abnormal; Notable for the following components:      Result Value   RBC 3.53 (*)    Hemoglobin 10.3 (*)    HCT 31.3 (*)    All other components within normal limits  COMPREHENSIVE METABOLIC PANEL - Abnormal; Notable for the following components:   BUN 33 (*)    Creatinine, Ser 2.65 (*)    AST 14 (*)    GFR, Estimated 22 (*)    All other components within normal limits  URINALYSIS, ROUTINE W REFLEX MICROSCOPIC - Abnormal; Notable for the  following components:   APPearance HAZY (*)    Protein, ur >=300 (*)    Leukocytes,Ua SMALL (*)    Bacteria, UA RARE (*)    All other components within normal limits  LIPASE, BLOOD  I-STAT BETA HCG BLOOD, ED ([facility], [facility], [facility] ONLY)    EKG None  Radiology CT ABDOMEN PELVIS WO CONTRAST  Result Date: 06/24/2021 CLINICAL DATA:  Abdominal pain and constipation x2 weeks. EXAM: CT ABDOMEN AND PELVIS WITHOUT CONTRAST TECHNIQUE: Multidetector CT imaging of the abdomen and pelvis was performed following  the standard protocol without IV contrast. RADIATION DOSE REDUCTION: This exam was performed according to the departmental dose-optimization program which includes automated exposure control, adjustment of the mA and/or kV according to patient size and/or use of iterative reconstruction technique. COMPARISON:  June 21, 2019 FINDINGS: Lower chest: Very mild hazy atelectasis versus early infiltrate is seen along the posterolateral aspect of the right lung base. Hepatobiliary: No focal liver abnormality is seen. No gallstones, gallbladder wall thickening, or biliary dilatation. Pancreas: Unremarkable. No pancreatic ductal dilatation or surrounding inflammatory changes. Spleen: Normal in size without focal abnormality. Adrenals/Urinary Tract: Adrenal glands are unremarkable. Kidneys are normal, without obstructing renal calculi, focal lesion, or hydronephrosis. A 6 mm nonobstructing renal calculus is seen within the lower pole of the right kidney. Bladder is unremarkable. Stomach/Bowel: Stomach is within normal limits. The appendix is surgically absent. Stool is seen throughout the large bowel. No evidence of bowel wall thickening, distention, or inflammatory changes. Vascular/Lymphatic: Aortic atherosclerosis. Multiple subcentimeter para-aortic lymph nodes are seen. Reproductive: The uterus is retroverted and position to the right of midline. The bilateral adnexa are unremarkable. Other: No abdominal wall hernia or  abnormality. No abdominopelvic ascites. Musculoskeletal: No acute or significant osseous findings. IMPRESSION: 1. 6 mm nonobstructing right renal calculus. 2. Large stool burden without evidence of bowel obstruction. Aortic Atherosclerosis (ICD10-I70.0). Electronically Signed   By: Virgina Norfolk M.D.   On: 06/24/2021 20:43    Procedures Procedures    Medications Ordered in ED Medications  Glycerin (Adult) 2 g suppository 1 suppository (1 suppository Rectal Given 06/24/21 2256)  bisacodyl (DULCOLAX) EC tablet 10 mg (10 mg Oral Given 06/24/21 2256)    ED Course/ Medical Decision Making/ A&P Clinical Course as of 06/24/21 2257  Fri Jun 24, 3941  3955 51 year old female with history of chronic kidney disease and diabetes presents with complaint of abdominal pain and constipation as above.  Reports emesis was feculent odor few days ago, no vomiting since that time.  On exam, found have generalized abdominal tenderness, bowel sounds normal.  Reviewed results with patient, hCG negative, lipase normal, CMP with baseline creatinine at 2.65.  CBC with no steroid changed to baseline hemoglobin of 10.3 urinalysis with a contaminated sample, no urinary symptoms.  CT without contrast due to patient's renal status found to have a large stool burden without evidence of bowel obstruction.  Discussed results with patient.  Offered enema in the emergency room, patient has declined.  She would like to try further aggressive treatment at home first.  Patient will be discharged with Dulcolax and glycerin suppository.  Advised she can take additional MiraLAX.  Given prescription for enema.  Recommend recheck with her PCP.  Return to ED for worsening or concerning symptoms. [LM]    Clinical Course User Index [LM] Tacy Learn, PA-C                           Medical Decision Making         Final Clinical Impression(s) / ED Diagnoses Final diagnoses:  Constipation, unspecified constipation type    Rx  / DC Orders ED Discharge Orders          Ordered    sodium phosphate (FLEET) 7-19 GM/118ML ENEM   Once        06/24/21 2243              Tacy Learn, PA-C 06/24/21 2257    Daleen Bo, MD 06/27/21 1944

## 2021-06-24 NOTE — ED Triage Notes (Addendum)
Pt c/o abdominal pain and constipation x 2 weeks. Pt report trying miralax and eating high fiber food without relief. Pt reports emesis x1 yesterday. Pt a/xo4.

## 2021-06-24 NOTE — ED Provider Triage Note (Signed)
Emergency Medicine Provider Triage Evaluation Note  Christina Orozco , a 51 y.o. female  was evaluated in triage.  Pt complains of constipation and abdominal pain x 2 weeks, last emesis a few days ago smelled of feces.   Review of Systems  Positive: Generalized abdominal pain, constipation Negative: fever  Physical Exam  BP (!) 160/93 (BP Location: Left Arm)    Pulse 93    Temp 97.9 F (36.6 C) (Oral)    Resp 18    LMP 05/22/2018 (Within Days)    SpO2 100%  Gen:   Awake, no distress   Resp:  Normal effort  MSK:   Moves extremities without difficulty  Other:    Medical Decision Making  Medically screening exam initiated at 4:41 PM.  Appropriate orders placed.  Marquis Cerone was informed that the remainder of the evaluation will be completed by another provider, this initial triage assessment does not replace that evaluation, and the importance of remaining in the ED until their evaluation is complete.     Tacy Learn, PA-C 06/24/21 1709

## 2021-08-26 ENCOUNTER — Ambulatory Visit (HOSPITAL_BASED_OUTPATIENT_CLINIC_OR_DEPARTMENT_OTHER): Payer: Medicaid Other | Admitting: Family

## 2021-09-22 NOTE — Progress Notes (Signed)
? ?Office Visit  ?  ?Patient Name: Christina Orozco ?Date of Encounter: 09/23/2021 ? ?PCP:  Rikki Spearing, NP ?  ?[place]  ?Cardiologist:  Skeet Latch, MD  ?Advanced Practice Provider:  No care team member to display ?Electrophysiologist:  None  ? ? ?Chief Complaint  ?  ?Christina Orozco is a 51 y.o. female with a hx of hypertension, severely, diabetes, recurrent stroke, seizure, CKD IV, asthma presents today for preoperative cardiovascular clearance ? ?Past Medical History  ?  ?Past Medical History:  ?Diagnosis Date  ? Anemia   ? severe  ? Anxiety   ? Asthma   ? CKD (chronic kidney disease)   ? stage IV  ? DDD (degenerative disc disease), cervical   ? Depression   ? Diabetes mellitus ([place])   ? Dislocated shoulder   ? right  ? GERD (gastroesophageal reflux disease)   ? Headache   ? migraines (about once a month)  ? History of kidney stones   ? Hypertension   ? Neuropathy   ? Peripheral vascular disease ([place])   ? Pneumonia   ? Stroke [facility])   ? ?Past Surgical History:  ?Procedure Laterality Date  ? ADENOIDECTOMY    ? APPENDECTOMY    ? CERVICAL SPINE SURGERY  06/2012  ? C5-C6  ? EYE SURGERY    ? left eye surgery   ? FOOT SURGERY    ? KENALOG INJECTION Left 03/10/2021  ? Procedure: Intravitreal Avastin Injection;  Surgeon: Sherlynn Stalls, MD;  Location: [place];  Service: Ophthalmology;  Laterality: Left;  ? LOOP RECORDER INSERTION N/A 08/27/2017  ? Procedure: LOOP RECORDER INSERTION;  Surgeon: Sanda Klein, MD;  Location: [place] CV [facility];  Service: Cardiovascular;  Laterality: N/A;  ? MEMBRANE PEEL Left 03/10/2021  ? Procedure: MEMBRANE PEEL;  Surgeon: Sherlynn Stalls, MD;  Location: [place];  Service: Ophthalmology;  Laterality: Left;  ? PARS PLANA VITRECTOMY Left 03/10/2021  ? Procedure: TWENTY-FIVE GAUGE PARS PLANA VITRECTOMY;  Surgeon: Sherlynn Stalls, MD;  Location: [place];  Service: Ophthalmology;  Laterality: Left;  ? PHOTOCOAGULATION WITH LASER Left 03/10/2021  ? Procedure:  PHOTOCOAGULATION WITH LASER;  Surgeon: Sherlynn Stalls, MD;  Location: [place];  Service: Ophthalmology;  Laterality: Left;  ? SHOULDER SURGERY    ? TEE WITHOUT CARDIOVERSION N/A 08/27/2017  ? Procedure: TRANSESOPHAGEAL ECHOCARDIOGRAM (TEE);  Surgeon: Sanda Klein, MD;  Location: [facility];  Service: Cardiovascular;  Laterality: N/A;  ? TONSILLECTOMY    ? ? ?Allergies ? ?Allergies  ?Allergen Reactions  ? Lisinopril Swelling and Other (See Comments)  ?  Facial swelling   ? Penicillins Hives  ?  Has patient had a PCN reaction causing immediate rash, facial/tongue/throat swelling, SOB or lightheadedness with hypotension: yes ?Has patient had a PCN reaction causing severe rash involving mucus membranes or skin necrosis: No ?Has patient had a PCN reaction that required hospitalization No ?Has patient had a PCN reaction occurring within the last 10 years: No ?If all of the above answers are "NO", then may proceed wit  ? Gabapentin Swelling and Other (See Comments)  ?  "I thought I was having another stroke."   ? ? ?History of Present Illness  ?  ?Christina Orozco is a 51 y.o. female with a hx of hypertension, severely, diabetes, recurrent stroke, seizure, CKD IV, asthma last seen 06/17/20 by Dr. Oval Linsey. ? ?Previous echocardiogram November 2017 LVEF 65 to 70% and mild basal septal hypertrophy.  Repeat echo June 2018 LVEF 65 to 70%  with severe LVH.  She was initially diagnosed with hypertension in high school per her report. Multiple family member with hypertension in her mother had a heart attack at age 32 and several other family members with premature CAD. ? ?She was admitted February 2019 with acute stroke in the left paramedian pons.  She presented with dizziness and admission blood pressure was 148/85.  Carotid Dopplers with bilateral 1% 9% stenosis and echo was unchanged from June 2018.  TEE performed 08/2017 no PFO.  Loop recorder placed at that time.  Loop recorder was removed 07/2019. Admitted September 2020 after  recurrent stroke. ? ?She was last seeen 06/17/20 doing overall well from a cardiac perspective.  Blood pressure has been much better controlled.  She did note mild lower extremity edema and she was instructed to take 20 mg of Lasix as needed for a day or 2 then go back to her usual 10 mg dose. ? ?She was admitted 06/19/2020 after presenting with acute onset right arm and leg weakness with aphasia and systolic blood pressure in the 240s.  Work-up revealed subacute infarct in the left lateral aspect of the pons -small vessel disease. ? ?She was hospitalized 01/05/2021 - 01/07/2021 at [facility] with diagnosis of strokelike symptoms.  She had extensive stroke work-up including normal intracranial MRI and no hemodynamically significant stenosis on carotid ultrasound January 2022.  MRI brain revealed new late to subacute infarct in the frontal lobe and possible pontine infarct as well.  She had been off of her Plavix for 2 months as she had run out. ED visit 01/15/21 with vertigo for which she was prescribed Meclizine.  ? ?Seen for preop clearance for vitrectomy of left eye 01/2021.  She had been started on meclizine for dizziness by PCP with minimal improvement.  Dizziness described as spinning sensation and she was awaiting appoint with ENT.  BP was well controlled at home.  She was provided permission to take additional 25 mg of hydralazine as needed for SBP greater than 150. ? ?She presents today for follow up.  Endorses lower extremity edema over the last couple months  most often when her legs are down.  She does try to wear compression stockings.  Last seen in February by her PCP with suspicion for venous insufficiency though DVT study ordered which revealed no DVT in bilateral lower extremity.  BP at home has been elevated and she has been requiring her additional as needed 25 mg of hydralazine.  Denies chest pain, shortness of breath, lightheadedness, dizziness, orthopnea, PND. ? ?EKGs/Labs/Other Studies  Reviewed:  ? ?The following studies were reviewed today: ? ?Echo 06/21/20 ? 1. Left ventricular ejection fraction, by estimation, is 70 to 75%. The  ?left ventricle has hyperdynamic function. The left ventricle has no  ?regional wall motion abnormalities. There is moderate concentric left  ?ventricular hypertrophy and severe basal  ?septal hypertrophy. Left ventricular diastolic parameters were normal.  ? 2. Right ventricular systolic function is normal. The right ventricular  ?size is normal. Tricuspid regurgitation signal is inadequate for assessing  ?PA pressure.  ? 3. A small pericardial effusion is present. The pericardial effusion is  ?posterior to the left ventricle.  ? 4. The mitral valve is normal in structure. No evidence of mitral valve  ?regurgitation. No evidence of mitral stenosis.  ? 5. The aortic valve is normal in structure. Aortic valve regurgitation is  ?trivial. No aortic stenosis is present.  ? 6. The inferior vena cava is normal in  size with greater than 50%  ?respiratory variability, suggesting right atrial pressure of 3 mmHg.  ? ?Carotid duplex 06/20/20 ?Right Carotid: Velocities in the right ICA are consistent with a 1-39%  ?stenosis.  ? ?Left Carotid: Velocities in the left ICA are consistent with a 1-39%  ?stenosis.  ? ?Vertebrals: Bilateral vertebral arteries demonstrate antegrade flow.  ? ?EKG: EKG ordered today.  EKG performed today demonstrates sinus rhythm 72 bpm with low voltage QRS but no acute ST/T wave changes. ? ?Recent Labs: ?06/24/2021: ALT 12; BUN 33; Creatinine, Ser 2.65; Hemoglobin 10.3; Platelets 328; Potassium 3.7; Sodium 140  ?Recent Lipid Panel ?   ?Component Value Date/Time  ? CHOL 130 06/20/2020 0851  ? CHOL 135 06/17/2020 1053  ? TRIG 89 06/20/2020 0851  ? HDL 46 06/20/2020 0851  ? HDL 47 06/17/2020 1053  ? CHOLHDL 2.8 06/20/2020 0851  ? VLDL 18 06/20/2020 0851  ? [place] 66 06/20/2020 0851  ? [place] 63 06/17/2020 1053  ? ?Home Medications  ? ?Current Meds  ?Medication  Sig  ? albuterol (PROAIR HFA) 108 (90 Base) MCG/ACT inhaler INHALE 1 OR 2 PUFFS INTO THE LUNGS EVERY 6 HOURS AS NEEDED FOR WHEEZING OR SHORTNESS OF BREATH (Patient taking differently: Inhale 1-2 puffs into the

## 2021-09-23 ENCOUNTER — Encounter (HOSPITAL_BASED_OUTPATIENT_CLINIC_OR_DEPARTMENT_OTHER): Payer: Self-pay | Admitting: Family

## 2021-09-23 ENCOUNTER — Ambulatory Visit (HOSPITAL_BASED_OUTPATIENT_CLINIC_OR_DEPARTMENT_OTHER): Payer: Medicaid Other | Admitting: Family

## 2021-09-23 VITALS — BP 140/76 | HR 72 | Ht 62.0 in | Wt 151.1 lb

## 2021-09-23 DIAGNOSIS — E782 Mixed hyperlipidemia: Secondary | ICD-10-CM | POA: Diagnosis not present

## 2021-09-23 DIAGNOSIS — N184 Chronic kidney disease, stage 4 (severe): Secondary | ICD-10-CM | POA: Diagnosis not present

## 2021-09-23 DIAGNOSIS — Z8673 Personal history of transient ischemic attack (TIA), and cerebral infarction without residual deficits: Secondary | ICD-10-CM

## 2021-09-23 DIAGNOSIS — E785 Hyperlipidemia, unspecified: Secondary | ICD-10-CM

## 2021-09-23 DIAGNOSIS — I10 Essential (primary) hypertension: Secondary | ICD-10-CM | POA: Diagnosis not present

## 2021-09-23 DIAGNOSIS — I119 Hypertensive heart disease without heart failure: Secondary | ICD-10-CM

## 2021-09-23 MED ORDER — AMLODIPINE BESYLATE 5 MG PO TABS: 5.0000 mg | ORAL_TABLET | Freq: Every day | ORAL | 2 refills | Status: DC

## 2021-09-23 MED ORDER — HYDRALAZINE HCL 50 MG PO TABS: 50.0000 mg | ORAL_TABLET | Freq: Two times a day (BID) | ORAL | 2 refills | Status: DC

## 2021-09-23 NOTE — Patient Instructions (Addendum)
Medication Instructions:  ?DECREASE Norvasc to 5 mg once a day  ?INCREASE Hydralazine to 50mg  take 1 tablet twice a day ?*If you need a refill on your cardiac medications before your next appointment, please call your pharmacy* ? ? ?Lab Work: ?None ordered ? ? ?Testing/Procedures: ?None Ordered ? ? ?Follow-Up: ?At [facility], you and your health needs are our priority.  As part of our continuing mission to provide you with exceptional heart care, we have created designated Provider Care Teams.  These Care Teams include your primary Cardiologist (physician) and Advanced Practice Providers (APPs -  Physician Assistants and Nurse Practitioners) who all work together to provide you with the care you need, when you need it. ? ?We recommend signing up for the patient portal called "MyChart".  Sign up information is provided on this After Visit Summary.  MyChart is used to connect with patients for Virtual Visits (Telemedicine).  Patients are able to view lab/test results, encounter notes, upcoming appointments, etc.  Non-urgent messages can be sent to your provider as well.   ?To learn more about what you can do with MyChart, go to [email or web address].   ? ?Your next appointment:   ?4 month(s) ? ?The format for your next appointment:   ?In Person ? ?Provider:   ?Skeet Latch, MD or Laurann Montana, NP  ? ? ?Other Instructions ?To prevent or reduce lower extremity swelling: ?Eat a low salt diet. Salt makes the body hold onto extra fluid which causes swelling. ?Sit with legs elevated. For example, in the recliner or on an [place].  ?Wear knee-high compression stockings during the daytime. Ones labeled 15-20 mmHg provide good compression. ? ?Important Information About Sugar ? ? ? ? ?  ? ? ? ?

## 2021-09-26 ENCOUNTER — Telehealth: Payer: Self-pay | Admitting: Cardiovascular Disease

## 2021-09-26 MED ORDER — HYDRALAZINE HCL 50 MG PO TABS: 50.0000 mg | ORAL_TABLET | Freq: Two times a day (BID) | ORAL | 2 refills | Status: DC

## 2021-09-26 MED ORDER — AMLODIPINE BESYLATE 5 MG PO TABS: 5.0000 mg | ORAL_TABLET | Freq: Every day | ORAL | 2 refills | Status: DC

## 2021-09-26 NOTE — Telephone Encounter (Signed)
Rx(s) sent to pharmacy electronically.  

## 2021-09-26 NOTE — Telephone Encounter (Signed)
?*  STAT* If patient is at the pharmacy, call can be transferred to refill team. ? ? ?1. Which medications need to be refilled? (please list name of each medication and dose if known)  ?amLODipine (NORVASC) 5 MG tablet ?ydrALAZINE (APRESOLINE) 50 MG tablet ? ?2. Which pharmacy/location (including street and city if local pharmacy) is medication to be sent to? ?[place], [place] ? ?3. Do they need a 30 day or 90 day supply?  ? ?90 day supply ? ?Patient states her medication was sent to the wrong pharmacy ? ?

## 2021-10-21 ENCOUNTER — Emergency Department (HOSPITAL_COMMUNITY)
Admission: EM | Admit: 2021-10-21 | Discharge: 2021-10-21 | Disposition: A | Discharge status: Home / Self Care | Payer: Medicaid Other | Attending: Emergency Medicine | Admitting: Emergency Medicine

## 2021-10-21 ENCOUNTER — Emergency Department (HOSPITAL_COMMUNITY): Payer: Medicaid Other

## 2021-10-21 ENCOUNTER — Other Ambulatory Visit: Payer: Self-pay

## 2021-10-21 ENCOUNTER — Encounter (HOSPITAL_COMMUNITY): Payer: Self-pay

## 2021-10-21 DIAGNOSIS — Z7902 Long term (current) use of antithrombotics/antiplatelets: Secondary | ICD-10-CM | POA: Insufficient documentation

## 2021-10-21 DIAGNOSIS — Z20822 Contact with and (suspected) exposure to covid-19: Secondary | ICD-10-CM | POA: Insufficient documentation

## 2021-10-21 DIAGNOSIS — N189 Chronic kidney disease, unspecified: Secondary | ICD-10-CM

## 2021-10-21 DIAGNOSIS — R4701 Aphasia: Secondary | ICD-10-CM | POA: Diagnosis not present

## 2021-10-21 DIAGNOSIS — Z79899 Other long term (current) drug therapy: Secondary | ICD-10-CM | POA: Diagnosis not present

## 2021-10-21 DIAGNOSIS — R410 Disorientation, unspecified: Secondary | ICD-10-CM | POA: Insufficient documentation

## 2021-10-21 DIAGNOSIS — N186 End stage renal disease: Secondary | ICD-10-CM | POA: Insufficient documentation

## 2021-10-21 DIAGNOSIS — R739 Hyperglycemia, unspecified: Secondary | ICD-10-CM | POA: Insufficient documentation

## 2021-10-21 DIAGNOSIS — R413 Other amnesia: Secondary | ICD-10-CM | POA: Diagnosis not present

## 2021-10-21 DIAGNOSIS — Z794 Long term (current) use of insulin: Secondary | ICD-10-CM | POA: Diagnosis not present

## 2021-10-21 DIAGNOSIS — D649 Anemia, unspecified: Secondary | ICD-10-CM | POA: Insufficient documentation

## 2021-10-21 DIAGNOSIS — Z7982 Long term (current) use of aspirin: Secondary | ICD-10-CM | POA: Diagnosis not present

## 2021-10-21 LAB — RESP PANEL BY RT-PCR (FLU A&B, COVID) ARPGX2
Influenza A by PCR: NEGATIVE
Influenza B by PCR: NEGATIVE
SARS Coronavirus 2 by RT PCR: NEGATIVE

## 2021-10-21 LAB — COMPREHENSIVE METABOLIC PANEL
ALT: 22 U/L (ref 0–44)
AST: 21 U/L (ref 15–41)
Albumin: 4 g/dL (ref 3.5–5.0)
Alkaline Phosphatase: 46 U/L (ref 38–126)
Anion gap: 9 (ref 5–15)
BUN: 48 mg/dL — ABNORMAL HIGH (ref 6–20)
CO2: 25 mmol/L (ref 22–32)
Calcium: 8.7 mg/dL — ABNORMAL LOW (ref 8.9–10.3)
Chloride: 108 mmol/L (ref 98–111)
Creatinine, Ser: 2.92 mg/dL — ABNORMAL HIGH (ref 0.44–1.00)
GFR, Estimated: 19 mL/min — ABNORMAL LOW (ref >60)
Glucose, Bld: 164 mg/dL — ABNORMAL HIGH (ref 70–99)
Potassium: 4 mmol/L (ref 3.5–5.1)
Sodium: 142 mmol/L (ref 135–145)
Total Bilirubin: 1 mg/dL (ref 0.3–1.2)
Total Protein: 7.2 g/dL (ref 6.5–8.1)

## 2021-10-21 LAB — I-STAT CHEM 8, ED
BUN: 45 mg/dL — ABNORMAL HIGH (ref 6–20)
Calcium, Ion: 1.19 mmol/L (ref 1.15–1.40)
Chloride: 109 mmol/L (ref 98–111)
Creatinine, Ser: 3 mg/dL — ABNORMAL HIGH (ref 0.44–1.00)
Glucose, Bld: 123 mg/dL — ABNORMAL HIGH (ref 70–99)
HCT: 31 % — ABNORMAL LOW (ref 36.0–46.0)
Hemoglobin: 10.5 g/dL — ABNORMAL LOW (ref 12.0–15.0)
Potassium: 3.8 mmol/L (ref 3.5–5.1)
Sodium: 146 mmol/L — ABNORMAL HIGH (ref 135–145)
TCO2: 26 mmol/L (ref 22–32)

## 2021-10-21 LAB — PROTIME-INR
INR: 1 (ref 0.8–1.2)
Prothrombin Time: 12.8 seconds (ref 11.4–15.2)

## 2021-10-21 LAB — CBC
HCT: 33.5 % — ABNORMAL LOW (ref 36.0–46.0)
Hemoglobin: 10.9 g/dL — ABNORMAL LOW (ref 12.0–15.0)
MCH: 29.1 pg (ref 26.0–34.0)
MCHC: 32.5 g/dL (ref 30.0–36.0)
MCV: 89.6 fL (ref 80.0–100.0)
Platelets: 227 10*3/uL (ref 150–400)
RBC: 3.74 MIL/uL — ABNORMAL LOW (ref 3.87–5.11)
RDW: 13.4 % (ref 11.5–15.5)
WBC: 6.5 10*3/uL (ref 4.0–10.5)
nRBC: 0 % (ref 0.0–0.2)

## 2021-10-21 LAB — URINALYSIS, ROUTINE W REFLEX MICROSCOPIC
Bilirubin Urine: NEGATIVE
Glucose, UA: NEGATIVE mg/dL
Hgb urine dipstick: NEGATIVE
Ketones, ur: NEGATIVE mg/dL
Nitrite: NEGATIVE
Protein, ur: 30 mg/dL — AB
Specific Gravity, Urine: 1.012 (ref 1.005–1.030)
pH: 5 (ref 5.0–8.0)

## 2021-10-21 LAB — DIFFERENTIAL
Abs Immature Granulocytes: 0.03 10*3/uL (ref 0.00–0.07)
Basophils Absolute: 0 10*3/uL (ref 0.0–0.1)
Basophils Relative: 0 %
Eosinophils Absolute: 0.2 10*3/uL (ref 0.0–0.5)
Eosinophils Relative: 3 %
Immature Granulocytes: 1 %
Lymphocytes Relative: 42 %
Lymphs Abs: 2.7 10*3/uL (ref 0.7–4.0)
Monocytes Absolute: 0.4 10*3/uL (ref 0.1–1.0)
Monocytes Relative: 6 %
Neutro Abs: 3.2 10*3/uL (ref 1.7–7.7)
Neutrophils Relative %: 48 %

## 2021-10-21 LAB — ETHANOL: Alcohol, Ethyl (B): <10 mg/dL (ref <10)

## 2021-10-21 LAB — RAPID URINE DRUG SCREEN, HOSP PERFORMED
Amphetamines: NOT DETECTED
Barbiturates: NOT DETECTED
Benzodiazepines: NOT DETECTED
Cocaine: NOT DETECTED
Opiates: NOT DETECTED
Tetrahydrocannabinol: NOT DETECTED

## 2021-10-21 LAB — APTT: aPTT: 25 seconds (ref 24–36)

## 2021-10-21 LAB — I-STAT BETA HCG BLOOD, ED (MC, WL, AP ONLY): I-stat hCG, quantitative: <5 m[IU]/mL (ref <5)

## 2021-10-21 NOTE — ED Triage Notes (Signed)
Slurred speech and "heavy memory loss" x 3 days  ?

## 2021-10-21 NOTE — ED Provider Notes (Signed)
?[facility] DEPT ?Provider Note ? ? ?CSN: [id number] ?Arrival date & time: 10/21/21  1014 ? ?  ? ?History ? ?Chief Complaint  ?Patient presents with  ? Aphasia  ? ? ?Christina Orozco is a 51 y.o. female. ? ?HPI ?5 caveat due to significant illness ?51 year old female presents today with reports of aphasia, confusion, memory loss.  Last known normal 3 days ago.  Daughter's main historian is at the bedside giving history.  States her mother had strokes in the past, has end-stage renal disease but is not on dialysis yet, and has a history of seizures and has not had any known recent seizures.  She reports she has not seen her mother for 3 days when she went to see her today she was very confused and has difficulty remembering anything that has happened today.  Brings her in secondary to this. ? ?  ? ?Home Medications ?Prior to Admission medications   ?Medication Sig Start Date End Date Taking? Authorizing Provider  ?albuterol (PROAIR HFA) 108 (90 Base) MCG/ACT inhaler INHALE 1 OR 2 PUFFS INTO THE LUNGS EVERY 6 HOURS AS NEEDED FOR WHEEZING OR SHORTNESS OF BREATH ?Patient taking differently: Inhale 1-2 puffs into the lungs every 6 (six) hours as needed for shortness of breath or wheezing. 04/12/18   Angiulli, Lavon Paganini, PA-C  ?amLODipine (NORVASC) 5 MG tablet Take 1 tablet (5 mg total) by mouth daily. 09/26/21   Skeet Latch, MD  ?aspirin 81 MG EC tablet Take 81 mg by mouth daily.    [provider]  ?atorvastatin (LIPITOR) 80 MG tablet Take 80 mg by mouth at bedtime. 02/25/20   [provider]  ?carvedilol (COREG) 12.5 MG tablet Take 12.5 mg by mouth 2 (two) times daily with a meal.    [provider]  ?Cholecalciferol (VITAMIN D3 PO) Take 2,000 mg by mouth daily.    [provider]  ?clopidogrel (PLAVIX) 75 MG tablet Take 1 tablet (75 mg total) by mouth daily. 01/11/21   Skeet Latch, MD  ?DULoxetine (CYMBALTA) 60 MG capsule Take 60 mg by mouth at  bedtime.    [provider]  ?ferrous sulfate 325 (65 FE) MG EC tablet Take 325 mg by mouth 2 (two) times daily.    [provider]  ?furosemide (LASIX) 20 MG tablet Take 20 mg by mouth daily. 11/20/19   [provider]  ?glucose blood (CONTOUR NEXT TEST) test strip 1 each by Other route 2 (two) times daily. And lancets 2/day ?Patient taking differently: 1 each by Other route 2 (two) times daily. 04/12/18   Angiulli, Lavon Paganini, PA-C  ?hydrALAZINE (APRESOLINE) 50 MG tablet Take 1 tablet (50 mg total) by mouth in the morning and at bedtime. 09/26/21   Skeet Latch, MD  ?levETIRAcetam (KEPPRA) 500 MG tablet TAKE 1 TABLET(500 MG) BY MOUTH TWICE DAILY ?Patient taking differently: Take 500 mg by mouth 2 (two) times daily. TAKE 1 TABLET(500 MG) BY MOUTH TWICE DAILY 07/15/18   Lanae Boast, FNP  ?meclizine (ANTIVERT) 25 MG tablet Take 1 tablet (25 mg total) by mouth 3 (three) times daily as needed for dizziness. 01/16/21   Veryl Speak, MD  ?pantoprazole (PROTONIX) 40 MG tablet TAKE 1 TABLET(40 MG) BY MOUTH DAILY ?Patient taking differently: Take 40 mg by mouth daily. 04/23/19   Tresa Garter, MD  ?Semaglutide,0.25 or 0.5MG /DOS, (OZEMPIC, 0.25 OR 0.5 MG/DOSE,) 2 MG/1.5ML SOPN Inject 0.5 mg into the skin every Sunday. 02/27/20   [provider]  ?traZODone (  DESYREL) 50 MG tablet Take 50-100 mg by mouth at bedtime. 07/05/21   [provider]  ?   ? ?Allergies    ?Lisinopril, Penicillins, and Gabapentin   ? ?Review of Systems   ?Review of Systems  ?Unable to perform ROS: Acuity of condition  ? ?Physical Exam ?Updated Vital Signs ?BP (!) 180/97 (BP Location: Right Arm)   Pulse 80   Temp 97.9 ?F (36.6 ?C) (Oral)   Resp (!) 21   Ht 1.575 m (5\' 2" )   Wt 68.5 kg   LMP 05/22/2018 (Within Days)   SpO2 100%   BMI 27.62 kg/m?  ?Physical Exam ?Vitals and nursing note reviewed.  ?Constitutional:   ?   Appearance: Normal appearance.  ?HENT:  ?   Head: Normocephalic and atraumatic.   ?   Right Ear: External ear normal.  ?   Left Ear: External ear normal.  ?   Nose: Nose normal.  ?   Mouth/Throat:  ?   Mouth: Mucous membranes are dry.  ?   Pharynx: Oropharynx is clear.  ?Eyes:  ?   Extraocular Movements: Extraocular movements intact.  ?   Pupils: Pupils are equal, round, and reactive to light.  ?Cardiovascular:  ?   Rate and Rhythm: Normal rate and regular rhythm.  ?   Pulses: Normal pulses.  ?Pulmonary:  ?   Effort: Pulmonary effort is normal.  ?Abdominal:  ?   General: Abdomen is flat.  ?   Palpations: Abdomen is soft.  ?Musculoskeletal:     ?   General: Normal range of motion.  ?   Cervical back: Normal range of motion.  ?Skin: ?   General: Skin is warm.  ?   Capillary Refill: Capillary refill takes less than 2 seconds.  ?Neurological:  ?   General: No focal deficit present.  ?   Mental Status: She is alert.  ?   Cranial Nerves: No cranial nerve deficit.  ?   Sensory: No sensory deficit.  ?   Motor: No weakness.  ?   Coordination: Coordination normal.  ?   Deep Tendon Reflexes: Reflexes normal.  ?Psychiatric:     ?   Mood and Affect: Mood normal.  ? ? ?ED Results / Procedures / Treatments   ?Labs ?(all labs ordered are listed, but only abnormal results are displayed) ?Labs Reviewed  ?CBC - Abnormal; Notable for the following components:  ?    Result Value  ? RBC 3.74 (*)   ? Hemoglobin 10.9 (*)   ? HCT 33.5 (*)   ? All other components within normal limits  ?COMPREHENSIVE METABOLIC PANEL - Abnormal; Notable for the following components:  ? Glucose, Bld 164 (*)   ? BUN 48 (*)   ? Creatinine, Ser 2.92 (*)   ? Calcium 8.7 (*)   ? GFR, Estimated 19 (*)   ? All other components within normal limits  ?URINALYSIS, ROUTINE W REFLEX MICROSCOPIC - Abnormal; Notable for the following components:  ? Color, Urine STRAW (*)   ? Protein, ur 30 (*)   ? Leukocytes,Ua SMALL (*)   ? Bacteria, UA RARE (*)   ? All other components within normal limits  ?I-STAT CHEM 8, ED - Abnormal; Notable for the following  components:  ? Sodium 146 (*)   ? BUN 45 (*)   ? Creatinine, Ser 3.00 (*)   ? Glucose, Bld 123 (*)   ? Hemoglobin 10.5 (*)   ? HCT 31.0 (*)   ? All other  components within normal limits  ?RESP PANEL BY RT-PCR (FLU A&B, COVID) ARPGX2  ?ETHANOL  ?PROTIME-INR  ?APTT  ?DIFFERENTIAL  ?RAPID URINE DRUG SCREEN, HOSP PERFORMED  ?I-STAT BETA HCG BLOOD, ED ([facility], [facility], [facility] ONLY)  ? ? ?EKG ?None ? ?Radiology ?CT Head Wo Contrast ? ?Result Date: 10/21/2021 ?CLINICAL DATA:  Mental status change, unknown cause EXAM: CT HEAD WITHOUT CONTRAST TECHNIQUE: Contiguous axial images were obtained from the base of the skull through the vertex without intravenous contrast. RADIATION DOSE REDUCTION: This exam was performed according to the departmental dose-optimization program which includes automated exposure control, adjustment of the mA and/or kV according to patient size and/or use of iterative reconstruction technique. COMPARISON:  CT head January 15, 2021.  MRI head January 16, 2021. FINDINGS: Brain: No evidence of acute large vascular territory infarction, hemorrhage, hydrocephalus, extra-axial collection or mass lesion/mass effect. Remote bilateral thalamic infarcts. Remote left corpus callosum body infarct. Additional patchy white matter hypodensities, nonspecific but compatible with chronic microvascular ischemic disease Vascular: No hyperdense vessel identified. Calcific atherosclerosis. Skull: No acute fracture. Sinuses/Orbits: Small amount of frothy secretions in the right sphenoid sinus. No acute orbital findings. Other: No mastoid effusions. IMPRESSION: 1. No evidence of acute intracranial abnormality. 2. Remote infarcts and chronic microvascular ischemic disease. Electronically Signed   By: Margaretha Sheffield M.D.   On: 10/21/2021 12:28  ? ?MR BRAIN WO CONTRAST ? ?Result Date: 10/21/2021 ?CLINICAL DATA:  Neuro deficit, acute, stroke suspected EXAM: MRI HEAD WITHOUT CONTRAST TECHNIQUE: Multiplanar, multiecho pulse sequences of the brain  and surrounding structures were obtained without intravenous contrast. COMPARISON:  CT head from the same day. FINDINGS: Brain: No acute infarction, acute hemorrhage, hydrocephalus, extra-axial collection or mass l

## 2021-10-21 NOTE — Discharge Instructions (Signed)
There is no evidence of stroke seen today on your evaluation ?Your kidney disease is progressing.  Please contact your doctor for follow-up next week ?Return if you are having any worsening problems at any time. ?

## 2021-10-26 ENCOUNTER — Other Ambulatory Visit (HOSPITAL_BASED_OUTPATIENT_CLINIC_OR_DEPARTMENT_OTHER): Payer: Self-pay | Admitting: Cardiovascular Disease

## 2021-10-26 ENCOUNTER — Telehealth (HOSPITAL_BASED_OUTPATIENT_CLINIC_OR_DEPARTMENT_OTHER): Payer: Self-pay | Admitting: Cardiovascular Disease

## 2021-10-26 MED ORDER — CARVEDILOL 12.5 MG PO TABS: 12.5000 mg | ORAL_TABLET | Freq: Two times a day (BID) | ORAL | 2 refills | Status: DC

## 2021-10-26 NOTE — Telephone Encounter (Signed)
Rx(s) sent to pharmacy electronically.  

## 2021-10-26 NOTE — Telephone Encounter (Signed)
?*  STAT* If patient is at the pharmacy, call can be transferred to refill team. ? ? ?1. Which medications need to be refilled? (please list name of each medication and dose if known) carvedilol (COREG) 12.5 MG tablet ? ?2. Which pharmacy/location (including street and city if local pharmacy) is medication to be sent to? [place], [place] ? ?3. Do they need a 30 day or 90 day supply? 90 ? ?

## 2021-10-27 NOTE — Telephone Encounter (Signed)
Rx(s) sent to pharmacy electronically.  

## 2021-11-10 ENCOUNTER — Observation Stay (HOSPITAL_COMMUNITY)
Admission: EM | Admit: 2021-11-10 | Discharge: 2021-11-11 | Disposition: A | Discharge status: Home / Self Care | Payer: Medicaid Other | Attending: Internal Medicine | Admitting: Internal Medicine

## 2021-11-10 ENCOUNTER — Emergency Department (HOSPITAL_COMMUNITY): Payer: Medicaid Other

## 2021-11-10 ENCOUNTER — Encounter (HOSPITAL_COMMUNITY): Payer: Self-pay

## 2021-11-10 ENCOUNTER — Other Ambulatory Visit: Payer: Self-pay

## 2021-11-10 DIAGNOSIS — Z7982 Long term (current) use of aspirin: Secondary | ICD-10-CM | POA: Diagnosis not present

## 2021-11-10 DIAGNOSIS — Z862 Personal history of diseases of the blood and blood-forming organs and certain disorders involving the immune mechanism: Secondary | ICD-10-CM

## 2021-11-10 DIAGNOSIS — N184 Chronic kidney disease, stage 4 (severe): Secondary | ICD-10-CM | POA: Insufficient documentation

## 2021-11-10 DIAGNOSIS — D649 Anemia, unspecified: Secondary | ICD-10-CM

## 2021-11-10 DIAGNOSIS — Z8673 Personal history of transient ischemic attack (TIA), and cerebral infarction without residual deficits: Secondary | ICD-10-CM | POA: Diagnosis not present

## 2021-11-10 DIAGNOSIS — E1122 Type 2 diabetes mellitus with diabetic chronic kidney disease: Secondary | ICD-10-CM | POA: Diagnosis not present

## 2021-11-10 DIAGNOSIS — I129 Hypertensive chronic kidney disease with stage 1 through stage 4 chronic kidney disease, or unspecified chronic kidney disease: Secondary | ICD-10-CM | POA: Insufficient documentation

## 2021-11-10 DIAGNOSIS — R200 Anesthesia of skin: Secondary | ICD-10-CM | POA: Diagnosis present

## 2021-11-10 DIAGNOSIS — I639 Cerebral infarction, unspecified: Secondary | ICD-10-CM | POA: Diagnosis not present

## 2021-11-10 DIAGNOSIS — Z7902 Long term (current) use of antithrombotics/antiplatelets: Secondary | ICD-10-CM | POA: Insufficient documentation

## 2021-11-10 DIAGNOSIS — I152 Hypertension secondary to endocrine disorders: Secondary | ICD-10-CM

## 2021-11-10 DIAGNOSIS — J45909 Unspecified asthma, uncomplicated: Secondary | ICD-10-CM | POA: Insufficient documentation

## 2021-11-10 DIAGNOSIS — I63311 Cerebral infarction due to thrombosis of right middle cerebral artery: Secondary | ICD-10-CM

## 2021-11-10 DIAGNOSIS — E1159 Type 2 diabetes mellitus with other circulatory complications: Secondary | ICD-10-CM

## 2021-11-10 DIAGNOSIS — I10 Essential (primary) hypertension: Secondary | ICD-10-CM

## 2021-11-10 DIAGNOSIS — Z79899 Other long term (current) drug therapy: Secondary | ICD-10-CM | POA: Diagnosis not present

## 2021-11-10 DIAGNOSIS — E119 Type 2 diabetes mellitus without complications: Secondary | ICD-10-CM

## 2021-11-10 DIAGNOSIS — N189 Chronic kidney disease, unspecified: Secondary | ICD-10-CM

## 2021-11-10 LAB — COMPREHENSIVE METABOLIC PANEL
ALT: 19 U/L (ref 0–44)
AST: 19 U/L (ref 15–41)
Albumin: 4 g/dL (ref 3.5–5.0)
Alkaline Phosphatase: 58 U/L (ref 38–126)
Anion gap: 7 (ref 5–15)
BUN: 37 mg/dL — ABNORMAL HIGH (ref 6–20)
CO2: 24 mmol/L (ref 22–32)
Calcium: 9.5 mg/dL (ref 8.9–10.3)
Chloride: 113 mmol/L — ABNORMAL HIGH (ref 98–111)
Creatinine, Ser: 2.86 mg/dL — ABNORMAL HIGH (ref 0.44–1.00)
GFR, Estimated: 20 mL/min — ABNORMAL LOW (ref >60)
Glucose, Bld: 126 mg/dL — ABNORMAL HIGH (ref 70–99)
Potassium: 4.4 mmol/L (ref 3.5–5.1)
Sodium: 144 mmol/L (ref 135–145)
Total Bilirubin: 0.5 mg/dL (ref 0.3–1.2)
Total Protein: 7.5 g/dL (ref 6.5–8.1)

## 2021-11-10 LAB — GLUCOSE, CAPILLARY: Glucose-Capillary: 83 mg/dL (ref 70–99)

## 2021-11-10 LAB — CBC
HCT: 31.1 % — ABNORMAL LOW (ref 36.0–46.0)
HCT: 32.7 % — ABNORMAL LOW (ref 36.0–46.0)
Hemoglobin: 10 g/dL — ABNORMAL LOW (ref 12.0–15.0)
Hemoglobin: 10.5 g/dL — ABNORMAL LOW (ref 12.0–15.0)
MCH: 29.2 pg (ref 26.0–34.0)
MCH: 28.5 pg (ref 26.0–34.0)
MCHC: 32.2 g/dL (ref 30.0–36.0)
MCHC: 32.1 g/dL (ref 30.0–36.0)
MCV: 90.9 fL (ref 80.0–100.0)
MCV: 88.6 fL (ref 80.0–100.0)
Platelets: 289 10*3/uL (ref 150–400)
Platelets: 298 10*3/uL (ref 150–400)
RBC: 3.42 MIL/uL — ABNORMAL LOW (ref 3.87–5.11)
RBC: 3.69 MIL/uL — ABNORMAL LOW (ref 3.87–5.11)
RDW: 13.1 % (ref 11.5–15.5)
RDW: 13 % (ref 11.5–15.5)
WBC: 4.7 10*3/uL (ref 4.0–10.5)
WBC: 5.1 10*3/uL (ref 4.0–10.5)
nRBC: 0 % (ref 0.0–0.2)
nRBC: 0 % (ref 0.0–0.2)

## 2021-11-10 LAB — ETHANOL: Alcohol, Ethyl (B): <10 mg/dL (ref <10)

## 2021-11-10 LAB — HEMOGLOBIN A1C
Hgb A1c MFr Bld: 6.5 % — ABNORMAL HIGH (ref 4.8–5.6)
Mean Plasma Glucose: 139.85 mg/dL

## 2021-11-10 LAB — DIFFERENTIAL
Abs Immature Granulocytes: 0.01 10*3/uL (ref 0.00–0.07)
Basophils Absolute: 0 10*3/uL (ref 0.0–0.1)
Basophils Relative: 1 %
Eosinophils Absolute: 0.1 10*3/uL (ref 0.0–0.5)
Eosinophils Relative: 3 %
Immature Granulocytes: 0 %
Lymphocytes Relative: 39 %
Lymphs Abs: 1.8 10*3/uL (ref 0.7–4.0)
Monocytes Absolute: 0.2 10*3/uL (ref 0.1–1.0)
Monocytes Relative: 5 %
Neutro Abs: 2.5 10*3/uL (ref 1.7–7.7)
Neutrophils Relative %: 52 %

## 2021-11-10 LAB — CBG MONITORING, ED: Glucose-Capillary: 81 mg/dL (ref 70–99)

## 2021-11-10 LAB — CREATININE, SERUM
Creatinine, Ser: 2.61 mg/dL — ABNORMAL HIGH (ref 0.44–1.00)
GFR, Estimated: 22 mL/min — ABNORMAL LOW (ref >60)

## 2021-11-10 LAB — PROTIME-INR
INR: 1.1 (ref 0.8–1.2)
Prothrombin Time: 14.3 seconds (ref 11.4–15.2)

## 2021-11-10 LAB — I-STAT BETA HCG BLOOD, ED (MC, WL, AP ONLY): I-stat hCG, quantitative: <5 m[IU]/mL (ref <5)

## 2021-11-10 LAB — APTT: aPTT: 27 seconds (ref 24–36)

## 2021-11-10 LAB — HIV ANTIBODY (ROUTINE TESTING W REFLEX): HIV Screen 4th Generation wRfx: NONREACTIVE

## 2021-11-10 MED ORDER — ACETAMINOPHEN 160 MG/5ML PO SOLN: 650.0000 mg | ORAL | Status: DC | PRN

## 2021-11-10 MED ORDER — SENNOSIDES-DOCUSATE SODIUM 8.6-50 MG PO TABS: 1.0000 | ORAL_TABLET | Freq: Every evening | ORAL | Status: DC | PRN

## 2021-11-10 MED ORDER — ALBUTEROL SULFATE (2.5 MG/3ML) 0.083% IN NEBU
2.5000 mg | INHALATION_SOLUTION | Freq: Four times a day (QID) | RESPIRATORY_TRACT | Status: DC | PRN
Start: 2021-11-10 — End: 2021-11-11

## 2021-11-10 MED ORDER — ACETAMINOPHEN 650 MG RE SUPP: 650.0000 mg | RECTAL | Status: DC | PRN

## 2021-11-10 MED ORDER — PANTOPRAZOLE SODIUM 40 MG PO TBEC
40.0000 mg | DELAYED_RELEASE_TABLET | Freq: Every day | ORAL | Status: DC
  Administered 2021-11-10 – 2021-11-11 (×2): 40 mg via ORAL
  Filled 2021-11-10 (×2): qty 1

## 2021-11-10 MED ORDER — TRAZODONE HCL 50 MG PO TABS: 50.0000 mg | ORAL_TABLET | Freq: Every evening | ORAL | Status: DC | PRN

## 2021-11-10 MED ORDER — ATORVASTATIN CALCIUM 80 MG PO TABS
80.0000 mg | ORAL_TABLET | Freq: Every day | ORAL | Status: DC
  Administered 2021-11-10: 80 mg via ORAL
  Filled 2021-11-10: qty 1

## 2021-11-10 MED ORDER — INSULIN ASPART 100 UNIT/ML IJ SOLN
0.0000 [IU] | Freq: Three times a day (TID) | INTRAMUSCULAR | Status: DC
  Filled 2021-11-10: qty 0.09

## 2021-11-10 MED ORDER — ACETAMINOPHEN 325 MG PO TABS: 650.0000 mg | ORAL_TABLET | ORAL | Status: DC | PRN

## 2021-11-10 MED ORDER — ASPIRIN 81 MG PO TBEC
81.0000 mg | DELAYED_RELEASE_TABLET | Freq: Every day | ORAL | Status: DC
  Administered 2021-11-10 – 2021-11-11 (×2): 81 mg via ORAL
  Filled 2021-11-10 (×2): qty 1

## 2021-11-10 MED ORDER — STROKE: EARLY STAGES OF RECOVERY BOOK
Freq: Once | Status: AC
Start: 2021-11-10 — End: 2021-11-10
  Filled 2021-11-10 (×2): qty 1

## 2021-11-10 MED ORDER — DULOXETINE HCL 60 MG PO CPEP
60.0000 mg | ORAL_CAPSULE | Freq: Every day | ORAL | Status: DC
  Administered 2021-11-10: 60 mg via ORAL
  Filled 2021-11-10: qty 1

## 2021-11-10 MED ORDER — LEVETIRACETAM 500 MG PO TABS
500.0000 mg | ORAL_TABLET | Freq: Two times a day (BID) | ORAL | Status: DC
  Filled 2021-11-10: qty 1

## 2021-11-10 MED ORDER — CARBOXYMETHYLCELLULOSE SODIUM 0.5 % OP SOLN
1.0000 [drp] | Freq: Every day | OPHTHALMIC | Status: DC | PRN
Start: 2021-11-10 — End: 2021-11-10

## 2021-11-10 MED ORDER — AMLODIPINE BESYLATE 5 MG PO TABS
5.0000 mg | ORAL_TABLET | Freq: Every day | ORAL | Status: DC
  Administered 2021-11-10 – 2021-11-11 (×2): 5 mg via ORAL
  Filled 2021-11-10 (×2): qty 1

## 2021-11-10 MED ORDER — HEPARIN SODIUM (PORCINE) 5000 UNIT/ML IJ SOLN
5000.0000 [IU] | Freq: Three times a day (TID) | INTRAMUSCULAR | Status: DC
  Administered 2021-11-10 – 2021-11-11 (×3): 5000 [IU] via SUBCUTANEOUS
  Filled 2021-11-10 (×3): qty 1

## 2021-11-10 NOTE — ED Notes (Signed)
Pt aware urine sample needed 

## 2021-11-10 NOTE — ED Provider Notes (Signed)
[facility] DEPT Provider Note   CSN: [id number] Arrival date & time: 11/10/21  1109     History  Chief Complaint  Patient presents with   Facial Numbness    Christina Orozco is a 51 y.o. female.  She has multiple medical problems including hypertension CKD stroke.  She has baseline unsteady gait and forgetfulness.  Uses a cane.  Complaining of left facial numbness that started yesterday afternoon.  Remained today so called her PCP who recommended she come here for further evaluation.  No headache blurry vision double vision, weakness in an arm or leg.  No recent illness.  She said she had an MRI a few weeks ago for confusion.  She said she has had strokes every year for the past 7 years.  Follows with neurology at [facility].  Has an appointment with them in a few weeks.  The history is provided by the patient.  Cerebrovascular Accident This is a new problem. The current episode started yesterday. The problem occurs constantly. The problem has not changed since onset.Pertinent negatives include no chest pain, no abdominal pain, no headaches and no shortness of breath. Nothing aggravates the symptoms. Nothing relieves the symptoms. She has tried rest for the symptoms. The treatment provided no relief.      Home Medications Prior to Admission medications   Medication Sig Start Date End Date Taking? Authorizing Provider  albuterol (PROAIR HFA) 108 (90 Base) MCG/ACT inhaler INHALE 1 OR 2 PUFFS INTO THE LUNGS EVERY 6 HOURS AS NEEDED FOR WHEEZING OR SHORTNESS OF BREATH Patient taking differently: Inhale 1-2 puffs into the lungs every 6 (six) hours as needed for shortness of breath or wheezing. 04/12/18   Angiulli, Lavon Paganini, PA-C  amLODipine (NORVASC) 5 MG tablet Take 1 tablet (5 mg total) by mouth daily. 09/26/21   Skeet Latch, MD  aspirin 81 MG EC tablet Take 81 mg by mouth daily.    [provider]  atorvastatin (LIPITOR) 80 MG tablet Take 80 mg by  mouth at bedtime. 02/25/20   [provider]  carvedilol (COREG) 12.5 MG tablet Take 1 tablet (12.5 mg total) by mouth 2 (two) times daily with a meal. 10/26/21   Skeet Latch, MD  Cholecalciferol (VITAMIN D3 PO) Take 2,000 mg by mouth daily.    [provider]  clopidogrel (PLAVIX) 75 MG tablet Take 1 tablet (75 mg total) by mouth daily. 01/11/21   Skeet Latch, MD  DULoxetine (CYMBALTA) 60 MG capsule Take 60 mg by mouth at bedtime.    [provider]  ferrous sulfate 325 (65 FE) MG EC tablet Take 325 mg by mouth 2 (two) times daily.    [provider]  furosemide (LASIX) 20 MG tablet TAKE ALTERNATING 1 TABLET AND 2 TABLETS BY MOUTH DAILY 10/27/21   Loel Dubonnet, NP  glucose blood (CONTOUR NEXT TEST) test strip 1 each by Other route 2 (two) times daily. And lancets 2/day Patient taking differently: 1 each by Other route 2 (two) times daily. 04/12/18   Angiulli, Lavon Paganini, PA-C  hydrALAZINE (APRESOLINE) 50 MG tablet Take 1 tablet (50 mg total) by mouth in the morning and at bedtime. 09/26/21   Skeet Latch, MD  levETIRAcetam (KEPPRA) 500 MG tablet TAKE 1 TABLET(500 MG) BY MOUTH TWICE DAILY Patient taking differently: Take 500 mg by mouth 2 (two) times daily. TAKE 1 TABLET(500 MG) BY MOUTH TWICE DAILY 07/15/18   Lanae Boast, FNP  meclizine (ANTIVERT) 25 MG tablet Take 1 tablet (  25 mg total) by mouth 3 (three) times daily as needed for dizziness. 01/16/21   Veryl Speak, MD  pantoprazole (PROTONIX) 40 MG tablet TAKE 1 TABLET(40 MG) BY MOUTH DAILY Patient taking differently: Take 40 mg by mouth daily. 04/23/19   Tresa Garter, MD  Semaglutide,0.25 or 0.5MG /DOS, (OZEMPIC, 0.25 OR 0.5 MG/DOSE,) 2 MG/1.5ML SOPN Inject 0.5 mg into the skin every Sunday. 02/27/20   [provider]  traZODone (DESYREL) 50 MG tablet Take 50-100 mg by mouth at bedtime. 07/05/21   [provider]      Allergies    Lisinopril, Penicillins, and Gabapentin     Review of Systems   Review of Systems  Constitutional:  Negative for fever.  HENT:  Negative for sore throat.   Eyes:  Negative for visual disturbance.  Respiratory:  Negative for shortness of breath.   Cardiovascular:  Negative for chest pain.  Gastrointestinal:  Negative for abdominal pain.  Musculoskeletal:  Negative for neck pain.  Skin:  Negative for rash.  Neurological:  Positive for numbness. Negative for facial asymmetry, speech difficulty, weakness and headaches.   Physical Exam Updated Vital Signs BP (!) 149/96 (BP Location: Right Arm)   Pulse 90   Temp 98.7 F (37.1 C) (Oral)   Resp 19   LMP 05/22/2018 (Within Days)   SpO2 98%  Physical Exam Vitals and nursing note reviewed.  Constitutional:      General: She is not in acute distress.    Appearance: Normal appearance. She is well-developed.  HENT:     Head: Normocephalic and atraumatic.  Eyes:     Conjunctiva/sclera: Conjunctivae normal.  Cardiovascular:     Rate and Rhythm: Normal rate and regular rhythm.     Heart sounds: No murmur heard. Pulmonary:     Effort: Pulmonary effort is normal. No respiratory distress.     Breath sounds: Normal breath sounds.  Abdominal:     Palpations: Abdomen is soft.     Tenderness: There is no abdominal tenderness. There is no guarding or rebound.  Musculoskeletal:        General: No swelling.     Cervical back: Neck supple.  Skin:    General: Skin is warm and dry.     Capillary Refill: Capillary refill takes less than 2 seconds.  Neurological:     Mental Status: She is alert.     Cranial Nerves: Cranial nerve deficit present.     Sensory: Sensory deficit present.     Motor: No weakness.     Comments: She has subjective decrease sensation of left forehead and left cheek left jaw.  Tongue midline no facial asymmetry normal speech.  Normal strength and sensation of upper and lower extremities.  No pronator drift.  Psychiatric:        Mood and Affect: Mood normal.     ED Results / Procedures / Treatments   Labs (all labs ordered are listed, but only abnormal results are displayed) Labs Reviewed  CBC - Abnormal; Notable for the following components:      Result Value   RBC 3.42 (*)    Hemoglobin 10.0 (*)    HCT 31.1 (*)    All other components within normal limits  COMPREHENSIVE METABOLIC PANEL - Abnormal; Notable for the following components:   Chloride 113 (*)    Glucose, Bld 126 (*)    BUN 37 (*)    Creatinine, Ser 2.86 (*)    GFR, Estimated 20 (*)    All  other components within normal limits  ETHANOL  PROTIME-INR  APTT  DIFFERENTIAL  RAPID URINE DRUG SCREEN, HOSP PERFORMED  URINALYSIS, ROUTINE W REFLEX MICROSCOPIC  HIV ANTIBODY (ROUTINE TESTING W REFLEX)  HEMOGLOBIN A1C  CBC  CREATININE, SERUM  LIPID PANEL  IRON AND TIBC  FERRITIN  I-STAT BETA HCG BLOOD, ED ([facility], [facility], [facility] ONLY)    EKG EKG Interpretation  Date/Time:  Thursday November 10 2021 11:19:06 EDT Ventricular Rate:  90 PR Interval:  135 QRS Duration: 69 QT Interval:  373 QTC Calculation: 457 R Axis:   74 Text Interpretation: Sinus rhythm Low voltage, precordial leads Baseline wander in lead(s) V5 No significant change since prior 5/23 Confirmed by Aletta Edouard [id number]) on 11/10/2021 11:34:41 AM  Radiology MR BRAIN WO CONTRAST  Result Date: 11/10/2021 CLINICAL DATA:  Left-sided facial numbness starting last night, history of strokes EXAM: MRI HEAD WITHOUT CONTRAST TECHNIQUE: Multiplanar, multiecho pulse sequences of the brain and surrounding structures were obtained without intravenous contrast. COMPARISON:  Brain MRI 10/21/2021 FINDINGS: Brain: There is a small focus of linear diffusion restriction in the right cerebral peduncle with corresponding low ADC signal consistent with acute ischemia. There is no associated hemorrhage or mass effect. There is no other evidence of acute infarct. There is no acute intracranial hemorrhage or extra-axial fluid collection. Background  parenchymal volume is normal. The ventricles are normal in size. Numerous small remote infarcts are seen in the bilateral basal ganglia, thalami, left corpus callosum, brainstem, and right cerebellum. Additional confluent FLAIR signal abnormality throughout the subcortical and periventricular white matter and pons is consistent with markedly age advanced chronic white matter microangiopathy. There are numerous punctate chronic microhemorrhages predominantly centrally located, likely hypertensive in etiology. There is no solid mass lesion. There is no mass effect or midline shift. Vascular: Normal flow voids. Skull and upper cervical spine: Normal marrow signal. Sinuses/Orbits: Paranasal sinuses are clear. A left lens implant is noted. The globes and orbits are otherwise unremarkable. 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 IMPRESSION: 1. Small linear focus of diffusion restriction in the right cerebral peduncle consistent with acute ischemia. No associated hemorrhage or mass effect. 2. Multiple additional remote infarcts, chronic microhemorrhages, and background advanced chronic white matter microangiopathy, unchanged. Electronically Signed   By: Valetta Mole M.D.   On: 11/10/2021 14:40    Procedures Procedures    Medications Ordered in ED Medications  aspirin EC tablet 81 mg (has no administration in time range)  atorvastatin (LIPITOR) tablet 80 mg (has no administration in time range)  amLODipine (NORVASC) tablet 5 mg (has no administration in time range)  DULoxetine (CYMBALTA) DR capsule 60 mg (has no administration in time range)  traZODone (DESYREL) tablet 50 mg (has no administration in time range)  pantoprazole (PROTONIX) EC tablet 40 mg (has no administration in time range)  levETIRAcetam (KEPPRA) tablet 500 mg (has no administration in time range)  albuterol (PROVENTIL) (2.5 MG/3ML) 0.083% nebulizer solution 2.5 mg (has no administration in time range)   stroke: early stages of  recovery book (has no administration in time range)  acetaminophen (TYLENOL) tablet 650 mg (has no administration in time range)    Or  acetaminophen (TYLENOL) 160 MG/5ML solution 650 mg (has no administration in time range)    Or  acetaminophen (TYLENOL) suppository 650 mg (has no administration in time range)  senna-docusate (Senokot-S) tablet 1 tablet (has no administration in time range)  heparin injection 5,000 Units (has no administration in time range)  insulin aspart (  novoLOG) injection 0-9 Units (has no administration in time range)    ED Course/ Medical Decision Making/ A&P Clinical Course as of 11/10/21 1704  Thu Nov 10, 2021  1514 Discussed with Dr. Rory Percy neurology.  He reviewed patient's MRI and feels the patient would benefit from admission to [facility] for further evaluation.  Hospitalist has been paged. [MB]  1527 Discussed with [facility] hospitalist Dr. Tyrell Antonio who will put the patient in for a bed at [facility]. [MB]    Clinical Course User Index [MB] Hayden Rasmussen, MD                           Medical Decision Making Amount and/or Complexity of Data Reviewed Labs: ordered. Radiology: ordered.  Risk Decision regarding hospitalization.  This patient complains of left-sided facial numbness; this involves an extensive number of treatment Options and is a complaint that carries with it a high risk of complications and morbidity. The differential includes stroke, bleed, metabolic derangement, hypoglycemia, peripheral nerve  I ordered, reviewed and interpreted labs, which included CBC with normal white count, low stable hemoglobin, chemistries with chronic CKD, alcohol negative I ordered imaging studies which included MRI brain and I independently    visualized and interpreted imaging which showed acute ischemic stroke Previous records obtained and reviewed in epic including recent PCP visit and ED visit 2 weeks ago  I consulted neurology Dr. Rory Percy and [facility] hospitalist  Dr. Tyrell Antonio and discussed lab and imaging findings and discussed disposition.  Cardiac monitoring reviewed, normal sinus rhythm Social determinants considered, no significant barriers Critical Interventions: None  After the interventions stated above, I reevaluated the patient and found patient to be fairly asymptomatic other than left facial numbness Admission and further testing considered, patient will need to be admitted to the hospital for further stroke work-up.  Patient in agreement with plan.          Final Clinical Impression(s) / ED Diagnoses Final diagnoses:  Acute CVA (cerebrovascular accident) [facility])    Rx / [place] Orders ED Discharge Orders     None         Hayden Rasmussen, MD 11/10/21 1709

## 2021-11-10 NOTE — ED Notes (Signed)
Pt prompted to provide urine sample, but pt unable to at this time.

## 2021-11-10 NOTE — H&P (Addendum)
History and Physical  Christina Orozco ZRA:[id number] DOB: 06-15-1970 DOA: 11/10/2021  PCP: Jenel Lucks, PA-C Patient coming from: Home   I have personally briefly reviewed patient's old medical records in [place]   Chief Complaint: Left Side Facial Numbness.   HPI: Christina Orozco is a 51 y.o. female with past medical history significant for anemia, anxiety, asthma, chronic kidney disease stage IV, depression, diabetes, hypertension, peripheral vascular disease, recurrent stroke presents complaining of left-sided facial numbness that started the day prior to admission on 5/31 afternoon.  She has some chronic aphasia and dysarthria from previous stroke.  She also has some chronic balance problem.  Denies any new or  worsening deficit from prior stroke.  She denies any new upper or lower extremity weakness.  She had an episode couple of weeks ago where she got very confused and she did not remember what happened.  Her physician discontinue medication for her overactive bladder.  Evaluation in the ED; sodium 144, potassium 4.4, chloride 113, glucose 126, BUN 37, creatinine 2.8, liver function test normal, hemoglobin 10, pregnancy test negative, alcohol level less than 10.    MRI brain:Small linear focus of diffusion restriction in the right cerebral peduncle consistent with acute ischemia. No associated hemorrhage or mass effect. Multiple additional remote infarcts, chronic microhemorrhages, and background advanced chronic white matter microangiopathy, unchanged.  Blood pressure was noted to be significantly elevated with systolic blood pressure 545/625.   Review of Systems: All systems reviewed and apart from history of presenting illness, are negative.  Past Medical History:  Diagnosis Date   Anemia    severe   Anxiety    Asthma    CKD (chronic kidney disease)    stage IV   DDD (degenerative disc disease), cervical    Depression    Diabetes mellitus  (HCC)    Dislocated shoulder    right   GERD (gastroesophageal reflux disease)    Headache    migraines (about once a month)   History of kidney stones    Hypertension    Neuropathy    Peripheral vascular disease ([place])    Pneumonia    Stroke [facility])    Past Surgical History:  Procedure Laterality Date   ADENOIDECTOMY     APPENDECTOMY     CERVICAL SPINE SURGERY  06/2012   C5-C6   EYE SURGERY     left eye surgery    FOOT SURGERY     KENALOG INJECTION Left 03/10/2021   Procedure: Intravitreal Avastin Injection;  Surgeon: Sherlynn Stalls, MD;  Location: [place];  Service: Ophthalmology;  Laterality: Left;   LOOP RECORDER INSERTION N/A 08/27/2017   Procedure: LOOP RECORDER INSERTION;  Surgeon: Sanda Klein, MD;  Location: [place] [facility];  Service: Cardiovascular;  Laterality: N/A;   MEMBRANE PEEL Left 03/10/2021   Procedure: MEMBRANE PEEL;  Surgeon: Sherlynn Stalls, MD;  Location: [place];  Service: Ophthalmology;  Laterality: Left;   PARS PLANA VITRECTOMY Left 03/10/2021   Procedure: TWENTY-FIVE GAUGE PARS PLANA VITRECTOMY;  Surgeon: Sherlynn Stalls, MD;  Location: [place];  Service: Ophthalmology;  Laterality: Left;   PHOTOCOAGULATION WITH LASER Left 03/10/2021   Procedure: PHOTOCOAGULATION WITH LASER;  Surgeon: Sherlynn Stalls, MD;  Location: [place];  Service: Ophthalmology;  Laterality: Left;   SHOULDER SURGERY     TEE WITHOUT CARDIOVERSION N/A 08/27/2017   Procedure: TRANSESOPHAGEAL ECHOCARDIOGRAM (TEE);  Surgeon: Sanda Klein, MD;  Location: [place];  Service: Cardiovascular;  Laterality: N/A;   TONSILLECTOMY  Social History:  reports that she has never smoked. She has never used smokeless tobacco. She reports that she does not drink alcohol and does not use drugs.   Allergies  Allergen Reactions   Lisinopril Swelling and Other (See Comments)    Facial swelling    Penicillins Hives    Has patient had a PCN reaction causing immediate rash, facial/tongue/throat swelling,  SOB or lightheadedness with hypotension: yes Has patient had a PCN reaction causing severe rash involving mucus membranes or skin necrosis: No Has patient had a PCN reaction that required hospitalization No Has patient had a PCN reaction occurring within the last 10 years: No If all of the above answers are "NO", then may proceed wit   Gabapentin Swelling and Other (See Comments)    "I thought I was having another stroke."     Family History  Problem Relation Age of Onset   Vascular Disease Mother    CAD Mother    Heart failure Mother    Heart disease Other    Cancer Other        colon, stomach, pancreatic, lung   Diabetes Other    Breast cancer Sister 30   Seizures Maternal Uncle      Prior to Admission medications   Medication Sig Start Date End Date Taking? Authorizing Provider  albuterol (PROAIR HFA) 108 (90 Base) MCG/ACT inhaler INHALE 1 OR 2 PUFFS INTO THE LUNGS EVERY 6 HOURS AS NEEDED FOR WHEEZING OR SHORTNESS OF BREATH Patient taking differently: Inhale 1-2 puffs into the lungs every 6 (six) hours as needed for shortness of breath or wheezing. 04/12/18  Yes Angiulli, Lavon Paganini, PA-C  amLODipine (NORVASC) 5 MG tablet Take 1 tablet (5 mg total) by mouth daily. 09/26/21  Yes Skeet Latch, MD  aspirin 81 MG EC tablet Take 81 mg by mouth daily.   Yes [provider]  atorvastatin (LIPITOR) 80 MG tablet Take 80 mg by mouth at bedtime. 02/25/20  Yes [provider]  carboxymethylcellulose (REFRESH PLUS) 0.5 % SOLN Place 1 drop into both eyes daily as needed (dry eyes).   Yes [provider]  carvedilol (COREG) 12.5 MG tablet Take 1 tablet (12.5 mg total) by mouth 2 (two) times daily with a meal. 10/26/21  Yes Skeet Latch, MD  Cholecalciferol (VITAMIN D3 PO) Take 2,000 mg by mouth daily.   Yes [provider]  clopidogrel (PLAVIX) 75 MG tablet Take 1 tablet (75 mg total) by mouth daily. 01/11/21  Yes Skeet Latch, MD  diclofenac Sodium  (VOLTAREN) 1 % GEL Apply 2 g topically daily as needed (pain).   Yes [provider]  DULoxetine (CYMBALTA) 60 MG capsule Take 60 mg by mouth at bedtime.   Yes [provider]  furosemide (LASIX) 20 MG tablet TAKE ALTERNATING 1 TABLET AND 2 TABLETS BY MOUTH DAILY Patient taking differently: Take 40 mg by mouth daily. 10/27/21  Yes Loel Dubonnet, NP  hydrALAZINE (APRESOLINE) 50 MG tablet Take 1 tablet (50 mg total) by mouth in the morning and at bedtime. 09/26/21  Yes Skeet Latch, MD  meclizine (ANTIVERT) 25 MG tablet Take 1 tablet (25 mg total) by mouth 3 (three) times daily as needed for dizziness. 01/16/21  Yes Delo, Nathaneil Canary, MD  pantoprazole (PROTONIX) 40 MG tablet TAKE 1 TABLET(40 MG) BY MOUTH DAILY Patient taking differently: Take 40 mg by mouth daily. 04/23/19  Yes Jegede, Marlena Clipper, MD  Semaglutide,0.25 or 0.5MG /DOS, (OZEMPIC, 0.25 OR 0.5 MG/DOSE,) 2 MG/1.5ML SOPN Inject  0.5 mg into the skin See admin instructions. Taking every 10 days 02/27/20  Yes [provider]  traZODone (DESYREL) 50 MG tablet Take 50 mg by mouth at bedtime as needed for sleep. 07/05/21  Yes [provider]  glucose blood (CONTOUR NEXT TEST) test strip 1 each by Other route 2 (two) times daily. And lancets 2/day Patient taking differently: 1 each by Other route 2 (two) times daily. 04/12/18   Angiulli, Lavon Paganini, PA-C  levETIRAcetam (KEPPRA) 500 MG tablet TAKE 1 TABLET(500 MG) BY MOUTH TWICE DAILY Patient taking differently: Take 500 mg by mouth 2 (two) times daily. TAKE 1 TABLET(500 MG) BY MOUTH TWICE DAILY 07/15/18   Lanae Boast, FNP   Physical Exam: Vitals:   11/10/21 1230 11/10/21 1300 11/10/21 1330 11/10/21 1504  BP: (!) 169/103 (!) 176/94 (!) 183/100 (!) 188/95  Pulse: 76 73 78 77  Resp: 16 19 16 18   Temp:      TempSrc:      SpO2: 100% 100% 100% 100%    General exam: Moderately built and nourished patient, lying comfortably supine on the gurney in no obvious  distress. Head, eyes and ENT: Nontraumatic and normocephalic. Pupils equally reacting to light and accommodation. Oral mucosa moist. Neck: Supple. No JVD, carotid bruit or thyromegaly. Lymphatics: No lymphadenopathy. Respiratory system: Clear to auscultation. No increased work of breathing. Cardiovascular system: S1 and S2 heard, RRR. No JVD, murmurs, gallops, clicks or pedal edema. Gastrointestinal system: Abdomen is nondistended, soft and nontender. Normal bowel sounds heard. No organomegaly or masses appreciated. Central nervous system: Alert and oriented. Chronic aphasia and dysarthria. Left side facial numbness and decreased sensation. No focal weakness.  Extremities: Symmetric 5 x 5 power. Peripheral pulses symmetrically felt.  Skin: No rashes or acute findings. Musculoskeletal system: Negative exam. Psychiatry: Pleasant and cooperative.   Labs on Admission:  Basic Metabolic Panel: Recent Labs  Lab 11/10/21 1148  NA 144  K 4.4  CL 113*  CO2 24  GLUCOSE 126*  BUN 37*  CREATININE 2.86*  CALCIUM 9.5   Liver Function Tests: Recent Labs  Lab 11/10/21 1148  AST 19  ALT 19  ALKPHOS 58  BILITOT 0.5  PROT 7.5  ALBUMIN 4.0   No results for input(s): LIPASE, AMYLASE in the last 168 hours. No results for input(s): AMMONIA in the last 168 hours. CBC: Recent Labs  Lab 11/10/21 1231  WBC 4.7  NEUTROABS 2.5  HGB 10.0*  HCT 31.1*  MCV 90.9  PLT 289   Cardiac Enzymes: No results for input(s): CKTOTAL, CKMB, CKMBINDEX, TROPONINI in the last 168 hours.  BNP (last 3 results) No results for input(s): PROBNP in the last 8760 hours. CBG: No results for input(s): GLUCAP in the last 168 hours.  Radiological Exams on Admission: MR BRAIN WO CONTRAST  Result Date: 11/10/2021 CLINICAL DATA:  Left-sided facial numbness starting last night, history of strokes EXAM: MRI HEAD WITHOUT CONTRAST TECHNIQUE: Multiplanar, multiecho pulse sequences of the brain and surrounding structures  were obtained without intravenous contrast. COMPARISON:  Brain MRI 10/21/2021 FINDINGS: Brain: There is a small focus of linear diffusion restriction in the right cerebral peduncle with corresponding low ADC signal consistent with acute ischemia. There is no associated hemorrhage or mass effect. There is no other evidence of acute infarct. There is no acute intracranial hemorrhage or extra-axial fluid collection. Background parenchymal volume is normal. The ventricles are normal in size. Numerous small remote infarcts are seen in the bilateral basal ganglia, thalami, left corpus callosum,  brainstem, and right cerebellum. Additional confluent FLAIR signal abnormality throughout the subcortical and periventricular white matter and pons is consistent with markedly age advanced chronic white matter microangiopathy. There are numerous punctate chronic microhemorrhages predominantly centrally located, likely hypertensive in etiology. There is no solid mass lesion. There is no mass effect or midline shift. Vascular: Normal flow voids. Skull and upper cervical spine: Normal marrow signal. Sinuses/Orbits: Paranasal sinuses are clear. A left lens implant is noted. The globes and orbits are otherwise unremarkable. 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 IMPRESSION: 1. Small linear focus of diffusion restriction in the right cerebral peduncle consistent with acute ischemia. No associated hemorrhage or mass effect. 2. Multiple additional remote infarcts, chronic microhemorrhages, and background advanced chronic white matter microangiopathy, unchanged. Electronically Signed   By: Valetta Mole M.D.   On: 11/10/2021 14:40    EKG: Independently reviewed. Sinus Rhythm.   Assessment/Plan Principal Problem:   Stroke [facility]) Active Problems:   Anemia   Hypertension   Diabetes mellitus ([place])   CKD (chronic kidney disease)   1-Acute stroke: -Left side facial numbness. -MRI; Small linear focus of diffusion  restriction in the right cerebral peduncle consistent with acute ischemia. No associated hemorrhage or  mass effect. Multiple additional remote infarcts, chronic microhemorrhages, and background advanced chronic white matter microangiopathy, unchanged. -Permissive HTN, But will resume low dose Norvasc, SBP 503, diastolic 546.  -Discussed with neurology, ok to resume Aspirin. Hold Plavix.  -Admit to [facility] for [facility]rology [facility].  -check carotid doppler, A1, ECHO. Defer to neurology MRA with contrast due to CKD>  -Monitor on telemetry.  -PT. OT, Speech swallow evaluation.  -Resume Keppra. Continue with Lipitor.   2-HTN;  Will only resume Norvasc in setting of acute stroke.  When clear by neurology, will need to resume coreg, Hydralazine, lasix.   3-CKD stage IV; Prior cr per records at 2.6--2.9 Cr stable at 2.8 Monitor.   4-Chronic Dysphagia;  She eats regular diet. She pass bedside swallow eval per ED nurse.  Start Heart healthy diet.  Repeat Speech eval.   5-Diabetes:  Last A1c 5.9 on 2022.  Repeat A1c.  On Ozempic at home. Hold while inpatient.  SSI.   6-Anemia; Check anemia panel.   DVT Prophylaxis: Heparin.  Code Status: Full Code Family Communication: Care discussed with patient.  Disposition Plan: admit under observation for work up of stroke.   Time spent: 75 minutes.   Elmarie Shiley MD [facility]   11/10/2021, 4:07 PM

## 2021-11-10 NOTE — ED Notes (Signed)
Pt still unable to provide urine sample 

## 2021-11-10 NOTE — ED Notes (Addendum)
Pt in MRI.

## 2021-11-10 NOTE — Consult Note (Addendum)
Neurology Consultation Reason for Consult: Stroke Requesting Physician: Niel Hummer  CC: Left facial tingling   History is obtained from: Patient and chart review  HPI: Christina Orozco is a 51 y.o. female with a past medical history significant for hypertension, hyperlipidemia, diabetes, chronic kidney disease, prior strokes, history of seizures possibly secondary to PRES (2019) versus underlying epilepsy  She reports that yesterday around 4 PM she started to notice her left face was tingling.  Given persistent symptoms she came to the ED for evaluation for stroke.  She notes her residual deficits from prior strokes include mild gait instability (ambulates well with a cane), poor handwriting, and mild dysphagia (tolerates a regular diet but needs to take small bites and chew carefully).  She also has significant diabetic eye disease, but no change in her vision recently.  She does feel like she has had hearing loss in the last few months, but no sudden change in her hearing (needs to ask her children to repeat themselves sometimes in conversation).  She was having some episodes of brain fog which she thinks has improved since her primary care physician stopped Vesicare.  Additionally she reports 1 week ago she had a dizzy episode where she felt like her heart was skipping some beats and her sugar was okay (though she did not check her blood pressure at that time).  She reports some chronic constipation, going usually only once to twice a week, last bowel movement was Monday after 3 weeks without a bowel movement.  However she denies any abdominal symptoms at this time.  She does note she has microscopic hematuria on laboratory testing and is scheduled for urology follow-up.  Otherwise she has been tolerating her home dual antiplatelet therapy well.  Additionally, regarding her history of seizures, she was started on Keppra in October 2019 after she presented with seizures.  Her outpatient  neurologist recently stopped Keppra at the patient's request in December 2022 without recurrent events to date and the patient has no concern for seizures at this time.  Additionally she has had extensive prior stroke work-up including loop recorder (implanted March 2019 and explanted in February 2021 without any significant arrhythmias captured), as well as a normal sleep study in 2017  LKW: 3 PM on 5/31 tPA given?: No, out of the window and too mild to treat Premorbid modified rankin scale:      2 - Slight disability. Able to look after own affairs without assistance, but unable to carry out all previous activities.  ROS: All other review of systems was negative except as noted in the HPI  Past Medical History:  Diagnosis Date   Anemia    severe   Anxiety    Asthma    CKD (chronic kidney disease)    stage IV   DDD (degenerative disc disease), cervical    Depression    Diabetes mellitus (HCC)    Dislocated shoulder    right   GERD (gastroesophageal reflux disease)    Headache    migraines (about once a month)   History of kidney stones    Hypertension    Neuropathy    Peripheral vascular disease ([place])    Pneumonia    Stroke [facility])    Past Surgical History:  Procedure Laterality Date   ADENOIDECTOMY     APPENDECTOMY     CERVICAL SPINE SURGERY  06/2012   C5-C6   EYE SURGERY     left eye surgery    FOOT SURGERY  KENALOG INJECTION Left 03/10/2021   Procedure: Intravitreal Avastin Injection;  Surgeon: Sherlynn Stalls, MD;  Location: [place];  Service: Ophthalmology;  Laterality: Left;   LOOP RECORDER INSERTION N/A 08/27/2017   Procedure: LOOP RECORDER INSERTION;  Surgeon: Sanda Klein, MD;  Location: [place] [facility];  Service: Cardiovascular;  Laterality: N/A;   MEMBRANE PEEL Left 03/10/2021   Procedure: MEMBRANE PEEL;  Surgeon: Sherlynn Stalls, MD;  Location: [place];  Service: Ophthalmology;  Laterality: Left;   PARS PLANA VITRECTOMY Left 03/10/2021   Procedure:  TWENTY-FIVE GAUGE PARS PLANA VITRECTOMY;  Surgeon: Sherlynn Stalls, MD;  Location: [place];  Service: Ophthalmology;  Laterality: Left;   PHOTOCOAGULATION WITH LASER Left 03/10/2021   Procedure: PHOTOCOAGULATION WITH LASER;  Surgeon: Sherlynn Stalls, MD;  Location: [place];  Service: Ophthalmology;  Laterality: Left;   SHOULDER SURGERY     TEE WITHOUT CARDIOVERSION N/A 08/27/2017   Procedure: TRANSESOPHAGEAL ECHOCARDIOGRAM (TEE);  Surgeon: Sanda Klein, MD;  [facility];  Service: Cardiovascular;  Laterality: N/A;   TONSILLECTOMY      Current Facility-Administered Medications:     stroke: early stages of recovery book, , Does not apply, Once, Regalado, Belkys A, MD   acetaminophen (TYLENOL) tablet 650 mg, 650 mg, Oral, Q4H PRN **OR** acetaminophen (TYLENOL) 160 MG/5ML solution 650 mg, 650 mg, Per Tube, Q4H PRN **OR** acetaminophen (TYLENOL) suppository 650 mg, 650 mg, Rectal, Q4H PRN, Regalado, Belkys A, MD   albuterol (PROVENTIL) (2.5 MG/3ML) 0.083% nebulizer solution 2.5 mg, 2.5 mg, Inhalation, Q6H PRN, Regalado, Belkys A, MD   amLODipine (NORVASC) tablet 5 mg, 5 mg, Oral, Daily, Regalado, Belkys A, MD   aspirin EC tablet 81 mg, 81 mg, Oral, Daily, Regalado, Belkys A, MD   atorvastatin (LIPITOR) tablet 80 mg, 80 mg, Oral, QHS, Regalado, Belkys A, MD   DULoxetine (CYMBALTA) DR capsule 60 mg, 60 mg, Oral, QHS, Regalado, Belkys A, MD   heparin injection 5,000 Units, 5,000 Units, Subcutaneous, Q8H, Regalado, Belkys A, MD   insulin aspart (novoLOG) injection 0-9 Units, 0-9 Units, Subcutaneous, TID WC, Regalado, Belkys A, MD   levETIRAcetam (KEPPRA) tablet 500 mg, 500 mg, Oral, BID, Regalado, Belkys A, MD   pantoprazole (PROTONIX) EC tablet 40 mg, 40 mg, Oral, Daily, Regalado, Belkys A, MD   senna-docusate (Senokot-S) tablet 1 tablet, 1 tablet, Oral, QHS PRN, Regalado, Belkys A, MD   traZODone (DESYREL) tablet 50 mg, 50 mg, Oral, QHS PRN, Regalado, Belkys A, MD  Current Outpatient  Medications  Medication Instructions   albuterol (PROAIR HFA) 108 (90 Base) MCG/ACT inhaler INHALE 1 OR 2 PUFFS INTO THE LUNGS EVERY 6 HOURS AS NEEDED FOR WHEEZING OR SHORTNESS OF BREATH   amLODipine (NORVASC) 5 mg, Oral, Daily   aspirin EC 81 mg, Oral, Daily   atorvastatin (LIPITOR) 80 mg, Oral, Daily at bedtime   carboxymethylcellulose (REFRESH PLUS) 0.5 % SOLN 1 drop, Both Eyes, Daily PRN   carvedilol (COREG) 12.5 mg, Oral, 2 times daily with meals   Cholecalciferol (VITAMIN D3 PO) 2,000 mg, Oral, Daily   clopidogrel (PLAVIX) 75 mg, Oral, Daily   diclofenac Sodium (VOLTAREN) 2 g, Topical, Daily PRN   DULoxetine (CYMBALTA) 60 mg, Oral, Daily at bedtime   furosemide (LASIX) 20 MG tablet TAKE ALTERNATING 1 TABLET AND 2 TABLETS BY MOUTH DAILY   glucose blood (CONTOUR NEXT TEST) test strip 1 each, Other, 2 times daily, And lancets 2/day   hydrALAZINE (APRESOLINE) 50 mg, Oral, 2 times daily   levETIRAcetam (KEPPRA) 500 MG tablet TAKE  1 TABLET(500 MG) BY MOUTH TWICE DAILY   meclizine (ANTIVERT) 25 mg, Oral, 3 times daily PRN   Ozempic (0.25 or 0.5 MG/DOSE) 0.5 mg, Subcutaneous, See admin instructions, Taking every 10 days   pantoprazole (PROTONIX) 40 MG tablet TAKE 1 TABLET(40 MG) BY MOUTH DAILY   traZODone (DESYREL) 50 mg, Oral, At bedtime PRN     Family History  Problem Relation Age of Onset   Vascular Disease Mother    CAD Mother    Heart failure Mother    Heart disease Other    Cancer Other        colon, stomach, pancreatic, lung   Diabetes Other    Breast cancer Sister 30   Seizures Maternal Uncle     Social History:  reports that she has never smoked. She has never used smokeless tobacco. She reports that she does not drink alcohol and does not use drugs.   Exam: Current vital signs: BP (!) 187/80 (BP Location: Right Arm)   Pulse 70   Temp 98.2 F (36.8 C) (Oral)   Resp 18   LMP 05/22/2018 (Within Days)   SpO2 100%  Vital signs in last 24 hours: Temp:  [97.9 F  (36.6 C)-98.7 F (37.1 C)] 98.2 F (36.8 C) (06/01 1833) Pulse Rate:  [70-90] 70 (06/01 1833) Resp:  [16-19] 18 (06/01 1833) BP: (148-214)/(80-107) 187/80 (06/01 1833) SpO2:  [98 %-100 %] 100 % (06/01 1833)   Physical Exam  Constitutional: Appears well-developed and well-nourished.  Psych: Affect appropriate to situation, calm and cooperative, pleasant Eyes: No scleral injection HENT: No oropharyngeal obstruction.  MSK: no joint deformities.  Cardiovascular: Normal rate and regular rhythm. Perfusing extremities well Respiratory: Effort normal, non-labored breathing GI: Soft.  No distension. There is no tenderness.  Skin: Warm dry and intact visible skin  Neuro: Mental Status: Patient is awake, alert, oriented to person, place, month, year, and situation. Patient is able to give a clear and coherent history. No signs of aphasia or neglect Cranial Nerves: II: Visual Fields are full.  Left pupil is 2 mm and minimally reactive to light, right pupil is reactive 2 mm to 1 mm and has moderately visible cataract III,IV, VI: EOMI without ptosis or diploplia.  She does have direction changing nystagmus as well as a very saccadic pursuits V: Facial sensation is reduced on the left face VII: Facial movement is symmetric.  VIII: hearing is mildly decreased on the left ear on Weber testing X: Uvula elevates symmetrically XI: Shoulder shrug is symmetric. XII: tongue is midline without atrophy or fasciculations.  Motor: Tone is normal. Bulk is normal. 5/5 strength was present in all four extremities.  Sensory: Sensation is reduced in the left arm and leg Deep Tendon Reflexes: 3+ and symmetric in the brachioradialis and patellae.  Cerebellar: FNF and HKS are intact bilaterally Gait:  Deferred   NIHSS total 1 Score breakdown: Sensory change in the left face arm and leg (arm and leg numbness is old but face numbness is new) Performed at 10:30 PM  I have reviewed labs in epic and the  results pertinent to this consultation are:  Lab Results  Component Value Date   HGBA1C 6.2 (H) 06/19/2020    Lab Results  Component Value Date   CHOL 130 06/20/2020   HDL 46 06/20/2020   LDLCALC 66 06/20/2020   TRIG 89 06/20/2020   CHOLHDL 2.8 06/20/2020     I have reviewed the images obtained:  MRI brain personally reviewed, agree with  radiology:  1. Small linear focus of diffusion restriction in the right cerebral peduncle consistent with acute ischemia. No associated hemorrhage or mass effect. 2. Multiple additional remote infarcts, chronic microhemorrhages, and background advanced chronic white matter microangiopathy, unchanged.   06/21/20 ECHO  1. Left ventricular ejection fraction, by estimation, is 70 to 75%. The  left ventricle has hyperdynamic function. The left ventricle has no  regional wall motion abnormalities. There is moderate concentric left  ventricular hypertrophy and severe basal  septal hypertrophy. Left ventricular diastolic parameters were normal.   2. Right ventricular systolic function is normal. The right ventricular  size is normal. Tricuspid regurgitation signal is inadequate for assessing  PA pressure.   3. A small pericardial effusion is present. The pericardial effusion is  posterior to the left ventricle.   4. The mitral valve is normal in structure. No evidence of mitral valve  regurgitation. No evidence of mitral stenosis.   5. The aortic valve is normal in structure. Aortic valve regurgitation is  trivial. No aortic stenosis is present.   6. The inferior vena cava is normal in size with greater than 50%  respiratory variability, suggesting right atrial pressure of 3 mmHg.   Impression: 51 y.o. with small vessel risk factors as above, now better controlled than prior, presenting with new left face tingling found to have a new right cerebral peduncle stroke. Already optimized on chronic DAPT, however given no recent ECHO and symptoms of  palpitations recently, reasonable to repeat this study as well as carotid ultrasound to fully optimize patient's stroke risk reduction going forward.   Recommendations: # Right cerebral peduncle stroke - Stroke labs HgbA1c, fasting lipid panel - Frequent neuro checks - Echocardiogram - Carotid dopplers - Continue home DAPT - Risk factor modification - Telemetry monitoring - Blood pressure goal   - Permissive hypertension to 220/120 tonight, then may start to normalize gradually - No need for PT consult, OT consult, Speech consult, as patient has isolated sensory symptoms (regarding new symptoms) - Stroke team to follow  # remote seizure history  -Keppra discontinued as patient is not on this medication at home  [facility] [phone] Available 7 PM to 7 AM, outside of these hours please call Neurologist on call as listed on Amion.

## 2021-11-10 NOTE — ED Triage Notes (Signed)
Pt c/o left sided facial numbness starting last night. Pt has hx of strokes, pt states at least 6 strokes. Pt states she is unsteady on her feet, uses cane at baseline and has for the last several years. Pt states she is having trouble with vision, but that is not new for her.

## 2021-11-10 NOTE — ED Notes (Signed)
Pt ambulated to the restroom and attempted to provide a urine sample. Attempt unsuccessful. Pt is drinking water and will attempt to urinate in a few minutes.

## 2021-11-11 ENCOUNTER — Observation Stay (HOSPITAL_BASED_OUTPATIENT_CLINIC_OR_DEPARTMENT_OTHER): Payer: Medicaid Other

## 2021-11-11 ENCOUNTER — Observation Stay (HOSPITAL_COMMUNITY): Payer: Medicaid Other

## 2021-11-11 DIAGNOSIS — Z8673 Personal history of transient ischemic attack (TIA), and cerebral infarction without residual deficits: Secondary | ICD-10-CM

## 2021-11-11 DIAGNOSIS — E119 Type 2 diabetes mellitus without complications: Secondary | ICD-10-CM | POA: Diagnosis not present

## 2021-11-11 DIAGNOSIS — I639 Cerebral infarction, unspecified: Secondary | ICD-10-CM | POA: Diagnosis not present

## 2021-11-11 DIAGNOSIS — I6389 Other cerebral infarction: Secondary | ICD-10-CM | POA: Diagnosis not present

## 2021-11-11 DIAGNOSIS — I10 Essential (primary) hypertension: Secondary | ICD-10-CM

## 2021-11-11 DIAGNOSIS — N184 Chronic kidney disease, stage 4 (severe): Secondary | ICD-10-CM | POA: Diagnosis not present

## 2021-11-11 DIAGNOSIS — E1159 Type 2 diabetes mellitus with other circulatory complications: Secondary | ICD-10-CM

## 2021-11-11 DIAGNOSIS — Z87898 Personal history of other specified conditions: Secondary | ICD-10-CM

## 2021-11-11 DIAGNOSIS — E7800 Pure hypercholesterolemia, unspecified: Secondary | ICD-10-CM

## 2021-11-11 DIAGNOSIS — D631 Anemia in chronic kidney disease: Secondary | ICD-10-CM

## 2021-11-11 LAB — LIPID PANEL
Cholesterol: 97 mg/dL (ref 0–200)
HDL: 46 mg/dL (ref >40)
LDL Cholesterol: 34 mg/dL (ref 0–99)
Total CHOL/HDL Ratio: 2.1 RATIO
Triglycerides: 87 mg/dL (ref <150)
VLDL: 17 mg/dL (ref 0–40)

## 2021-11-11 LAB — GLUCOSE, CAPILLARY
Glucose-Capillary: 104 mg/dL — ABNORMAL HIGH (ref 70–99)
Glucose-Capillary: 128 mg/dL — ABNORMAL HIGH (ref 70–99)
Glucose-Capillary: 102 mg/dL — ABNORMAL HIGH (ref 70–99)
Glucose-Capillary: 95 mg/dL (ref 70–99)

## 2021-11-11 LAB — IRON AND TIBC
Iron: 46 ug/dL (ref 28–170)
Saturation Ratios: 23 % (ref 10.4–31.8)
TIBC: 199 ug/dL — ABNORMAL LOW (ref 250–450)
UIBC: 153 ug/dL

## 2021-11-11 LAB — FERRITIN: Ferritin: 124 ng/mL (ref 11–307)

## 2021-11-11 LAB — ECHOCARDIOGRAM COMPLETE
AR max vel: 3.19 cm2
AV Peak grad: 4.2 mmHg
Ao pk vel: 1.02 m/s
Area-P 1/2: 3.74 cm2
S' Lateral: 2.1 cm

## 2021-11-11 MED ORDER — TICAGRELOR 90 MG PO TABS
90.0000 mg | ORAL_TABLET | Freq: Two times a day (BID) | ORAL | Status: DC
Start: 2021-11-11 — End: 2021-11-12

## 2021-11-11 MED ORDER — TICAGRELOR 90 MG PO TABS: 90.0000 mg | ORAL_TABLET | Freq: Two times a day (BID) | ORAL | Status: DC

## 2021-11-11 MED ORDER — VITAMIN D3 50 MCG (2000 UT) PO CAPS: 2000.0000 [IU] | ORAL_CAPSULE | Freq: Every day | ORAL | Status: DC

## 2021-11-11 NOTE — TOC Initial Note (Signed)
Transition of Care [facility]) - Initial/Assessment Note    Patient Details  Name: Christina Orozco MRN: [id number] Date of Birth: 16-Apr-1971  Transition of [facility]) CM/SW Contact:    Ninfa Meeker, RN Phone Number: 11/11/2021, 12:26 PM  Clinical Narrative:        Transition of Care screening Note:           Transition of Care Department [facility]) has reviewed patient and no TOC needs have been identified at this time. We will continue to monitor patient advancement through Interdisciplinary progressions. If new patient transition needs arise, please place a consult.       Patient Goals and CMS Choice        Expected Discharge Plan and Services                                                Prior Living Arrangements/Services                       Activities of Daily Living Home Assistive Devices/Equipment: Cane (specify quad or straight) ADL Screening (condition at time of admission) Patient's cognitive ability adequate to safely complete daily activities?: Yes Is the patient deaf or have difficulty hearing?: No Does the patient have difficulty seeing, even when wearing glasses/contacts?: Yes Does the patient have difficulty concentrating, remembering, or making decisions?: No Patient able to express need for assistance with ADLs?: Yes Does the patient have difficulty dressing or bathing?: No Independently performs ADLs?: Yes (appropriate for developmental age) Does the patient have difficulty walking or climbing stairs?: No Weakness of Legs: None Weakness of Arms/Hands: None  Permission Sought/Granted                  Emotional Assessment              Admission diagnosis:  Stroke [facility]) [I63.9] Acute CVA (cerebrovascular accident) [facility]) [I63.9] Patient Active Problem List   Diagnosis Date Noted   Stroke ([place]) 11/10/2021   Depression 07/05/2020   CVA (cerebral vascular accident) ([place]) 06/21/2020   CVA (cerebrovascular accident) ([place])  06/19/2020   Abnormality of gait 10/14/2018   Dizziness and giddiness 09/13/2018   Debility 06/26/2018   Chronic nonintractable headache 38/25/0539   Diastolic dysfunction    Congestion of nasal sinus    Pneumonia of left lower lobe due to infectious organism    SOB (shortness of breath)    Candidiasis    CKD (chronic kidney disease)    Anemia    Hypoalbuminemia due to protein-calorie malnutrition ([place])    Hypertension    Diabetes mellitus ([place])    PRES (posterior reversible encephalopathy syndrome)    Hypokalemia    History of CVA with residual deficit    History of seizure 03/25/2018   Bilateral carpal tunnel syndrome 02/14/2018   Arthritis 10/31/2017   Asthma 10/31/2017   Constipation 10/31/2017   Kidney stone 10/31/2017   Anemia of chronic disease 08/09/2017   Hyperlipidemia 07/26/2017   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   Anxiety as acute reaction to exceptional stress 04/23/2017   Insomnia 10/08/2016   Neuropathy 06/07/2016   Gait instability    Lesion of pons    Slurred speech    Dizziness 04/30/2016   Hyperglycemia 04/30/2016   Chronic kidney disease (CKD), stage III (moderate) (HCC) 04/30/2016   Ataxia 04/30/2016   Chronic  right shoulder pain 04/03/2016   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 [place] [address] Phone: [phone] Fax: [phone]     Social Determinants of Health (SDOH) Interventions    Readmission Risk Interventions     View : No data to display.

## 2021-11-11 NOTE — Hospital Course (Addendum)
Christina Orozco is a 51 y.o. female with past medical history significant for anemia, anxiety, asthma, chronic kidney disease stage IV, depression, diabetes mellitus, hypertension, peripheral vascular disease, recurrent stroke presented hospital with left facial numbness.  Patient does have a history of chronic dysphagia and dysarthria from previous stroke with gait imbalance.  In the ED sodium 144, potassium 4.4, chloride 113, glucose 126, BUN 37, creatinine 2.8, liver function test normal, hemoglobin 10, pregnancy test negative, alcohol level less than 10.  Patient was then admitted hospital for acute stroke.  Assessment/Plan  Principal Problem:   Stroke [facility]) Active Problems:   Anemia   Hypertension   Diabetes mellitus ([place])   CKD (chronic kidney disease)   Right cerebral peduncle stroke Presented with left-sided facial numbness.  MRI shows diffusion restriction in the right cerebral peduncle with findings of previous stroke. Patient was on permissive hypertension.   Seen by neurology.  2D echocardiogram showed LV ejection fraction of 70 to 75% with no regional wall motion abnormality.  Hemoglobin A1c at 6.5.  Lipid panel with LDL of 34.  Ferritin at 124.  Continue high intensity Lipitor, aspirin.  Carotid duplex is pending.  At this time, neurology has recommended aspirin and Brilinta for next 30 days followed by aspirin and Plavix.  Communicated with neurology prior to discharge.   Essential hypertension. Currently on permissive hypertension.  On Coreg hydralazine and Norvasc as outpatient.    Diabetes mellitus type 2.  Latest hemoglobin A1c of 6.5.  Will need better control.  On Ozempic at home.  Patient received sliding scale insulin while in the hospital.  Chronic dysphagia.  On a regular consistency diet at home.  CKD stage IV.  Previous creatinine at baseline around 2.6-2.9.  Latest creatinine at 2.8.  Remote history of seizures.   not on this medication at home  History of  anemia likely secondary to chronic kidney disease.  Ferritin and iron within normal limits.

## 2021-11-11 NOTE — Evaluation (Signed)
Speech Language Pathology Evaluation Patient Details Name: Christina Orozco MRN: [id number] DOB: [birth year] Today's Date: 11/11/2021 Time: 9470-9628 SLP Time Calculation (min) (ACUTE ONLY): 21 min  Problem List:  Patient Active Problem List   Diagnosis Date Noted   Stroke ([place]) 11/10/2021   Depression 07/05/2020   CVA (cerebral vascular accident) ([place]) 06/21/2020   CVA (cerebrovascular accident) ([place]) 06/19/2020   Abnormality of gait 10/14/2018   Dizziness and giddiness 09/13/2018   Debility 06/26/2018   Chronic nonintractable headache 36/62/9476   Diastolic dysfunction    Congestion of nasal sinus    Pneumonia of left lower lobe due to infectious organism    SOB (shortness of breath)    Candidiasis    CKD (chronic kidney disease)    Anemia    Hypoalbuminemia due to protein-calorie malnutrition ([place])    Hypertension    Diabetes mellitus ([place])    PRES (posterior reversible encephalopathy syndrome)    Hypokalemia    History of CVA with residual deficit    History of seizure 03/25/2018   Bilateral carpal tunnel syndrome 02/14/2018   Arthritis 10/31/2017   Asthma 10/31/2017   Constipation 10/31/2017   Kidney stone 10/31/2017   Anemia of chronic disease 08/09/2017   Hyperlipidemia 07/26/2017   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   Anxiety as acute reaction to exceptional stress 04/23/2017   Insomnia 10/08/2016   Neuropathy 06/07/2016   Gait instability    Lesion of pons    Slurred speech    Dizziness 04/30/2016   Hyperglycemia 04/30/2016   Chronic kidney disease (CKD), stage III (moderate) ([place]) 04/30/2016   Ataxia 04/30/2016   Chronic right shoulder pain 04/03/2016   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   Rhinitis, allergic 08/25/2015   Asthma, mild intermittent 07/14/2015   Gastroesophageal reflux disease without esophagitis 06/08/2015   Hypertensive cardiovascular disease 05/05/2015   Vitamin D deficiency 05/05/2015   Past Medical History:  Past Medical  History:  Diagnosis Date   Anemia    severe   Anxiety    Asthma    CKD (chronic kidney disease)    stage IV   DDD (degenerative disc disease), cervical    Depression    Diabetes mellitus ([place])    Dislocated shoulder    right   GERD (gastroesophageal reflux disease)    Headache    migraines (about once a month)   History of kidney stones    Hypertension    Neuropathy    Peripheral vascular disease ([place])    Pneumonia    Stroke [facility])    Past Surgical History:  Past Surgical History:  Procedure Laterality Date   ADENOIDECTOMY     APPENDECTOMY     CERVICAL SPINE SURGERY  06/2012   C5-C6   EYE SURGERY     left eye surgery    FOOT SURGERY     KENALOG INJECTION Left 03/10/2021   Procedure: Intravitreal Avastin Injection;  Surgeon: Sherlynn Stalls, MD;  Location: [place];  Service: Ophthalmology;  Laterality: Left;   LOOP RECORDER INSERTION N/A 08/27/2017   Procedure: LOOP RECORDER INSERTION;  Surgeon: Sanda Klein, MD;  Location: [place] CV [facility];  Service: Cardiovascular;  Laterality: N/A;   MEMBRANE PEEL Left 03/10/2021   Procedure: MEMBRANE PEEL;  Surgeon: Sherlynn Stalls, MD;  Location: [place];  Service: Ophthalmology;  Laterality: Left;   PARS PLANA VITRECTOMY Left 03/10/2021   Procedure: TWENTY-FIVE GAUGE PARS PLANA VITRECTOMY;  Surgeon: Sherlynn Stalls, MD;  Location: [place];  Service: Ophthalmology;  Laterality: Left;  PHOTOCOAGULATION WITH LASER Left 03/10/2021   Procedure: PHOTOCOAGULATION WITH LASER;  Surgeon: Sherlynn Stalls, MD;  Location: [place];  Service: Ophthalmology;  Laterality: Left;   SHOULDER SURGERY     TEE WITHOUT CARDIOVERSION N/A 08/27/2017   Procedure: TRANSESOPHAGEAL ECHOCARDIOGRAM (TEE);  Surgeon: Sanda Klein, MD;  Location[facility] ENDOSCOPY;  Service: Cardiovascular;  Laterality: N/A;   TONSILLECTOMY     HPI:  51 y.o. female presents to ED 11/10/21 for stroke evaluation after experiencing L face tingling. MRI reveals Small linear focus of diffusion  restriction in the right cerebral peduncle consistent with acute ischemia. BP noted to be significatntly elevated at 183/100 admitted for observation. PMH: hypertension, hyperlipidemia, diabetes, chronic kidney disease, prior strokes, history of seizures possibly secondary to PRES (2019) versus underlying epilepsy   Assessment / Plan / Recommendation Clinical Impression  Pt describes a h/o more chronic cognitive changes as well as some fluctuations in her memory recently, for which she is scheduled to f/u with her neurologist on 6/7. She denies any acute changes in her cognition or communicaiton since this admission, although she does endorse ongoing facial numbness. Her speech is clear and language is fluent. She scored 28/30 on the SLUMS (27 and above considered to be in a normal range). Pt does not have acute SLP needs and seems to have adquate support in place already at home. SLP will sign off acutely but encouraged her to keep her neurology appointment.    SLP Assessment  SLP Recommendation/Assessment: Patient does not need any further Speech Encinal Pathology Services SLP Visit Diagnosis: Cognitive communication deficit (R41.841)    Recommendations for follow up therapy are one component of a multi-disciplinary discharge planning process, led by the attending physician.  Recommendations may be updated based on patient status, additional functional criteria and insurance authorization.    Follow Up Recommendations  No SLP follow up    Assistance Recommended at Discharge  Intermittent Supervision/Assistance  Functional Status Assessment Patient has not had a recent decline in their functional status  Frequency and Duration           SLP Evaluation Cognition  Overall Cognitive Status: History of cognitive impairments - at baseline       Comprehension  Auditory Comprehension Overall Auditory Comprehension: Appears within functional limits for tasks assessed    Expression  Expression Primary Mode of Expression: Verbal Verbal Expression Overall Verbal Expression: Appears within functional limits for tasks assessed Written Expression Dominant Hand: Right   Oral / Motor  Motor Speech Overall Motor Speech: Appears within functional limits for tasks assessed            Osie Bond., M.A. [place] Office [phone]  Secure chat preferred  11/11/2021, 1:58 PM

## 2021-11-11 NOTE — Progress Notes (Signed)
Pt wheeled off thins unit by this RN. DC paperwork reviewed with the pt. All belongings by side

## 2021-11-11 NOTE — Evaluation (Signed)
Physical Therapy Evaluation Patient Details Name: Christina Orozco MRN: [id number] DOB: April 14, 1971 Today's Date: 11/11/2021  History of Present Illness  51 y.o. female presents to ED 11/10/21 for stroke evaluation after experiencing L face tingling. MRI reveals Small linear focus of diffusion restriction in the right cerebral peduncle consistent with acute ischemia. BP noted to be significatntly elevated at 183/100 admitted for observation. PMH: hypertension, hyperlipidemia, diabetes, chronic kidney disease, prior strokes, history of seizures possibly secondary to PRES (2019) versus underlying epilepsy   Clinical Impression  PTA pt living with daughter in single story apartment with level entry. Pt ambulates with cane in home and limited community distances. Pt has HHAide during week to assist with ADLs. Pt is currently limited in safe mobility by global generalized weakness in addition to baseline level L sided weakness from prior CVA, and associated decreased balance. Pt is currently mod I for bed mobility, and transfers and minA-min guard for ambulation with straightcane. Pt is close to baseline level of function so PT recommendation is for no additional PT at discharge however PT will continue to follow acutely.     Recommendations for follow up therapy are one component of a multi-disciplinary discharge planning process, led by the attending physician.  Recommendations may be updated based on patient status, additional functional criteria and insurance authorization.  Follow Up Recommendations No PT follow up    Assistance Recommended at Discharge Intermittent Supervision/Assistance  Patient can return home with the following  Assistance with cooking/housework;A little help with walking and/or transfers;A little help with bathing/dressing/bathroom    Equipment Recommendations None recommended by PT     Functional Status Assessment Patient has had a recent decline in their functional status  and demonstrates the ability to make significant improvements in function in a reasonable and predictable amount of time.     Precautions / Restrictions Precautions Precautions: Fall Precaution Comments: fall last month Restrictions Weight Bearing Restrictions: No      Mobility  Bed Mobility Overal bed mobility: Modified Independent             General bed mobility comments: HoB elevated and use of bedrail    Transfers Overall transfer level: Modified independent Equipment used: Straight cane               General transfer comment: increased time and effort to come to standing and self steady, but no outside assist required    Ambulation/Gait Ambulation/Gait assistance: Min assist, Min guard Gait Distance (Feet): 100 Feet Assistive device: Straight cane Gait Pattern/deviations: Step-through pattern, Decreased stance time - left, Decreased step length - right, Trunk flexed Gait velocity: slowed Gait velocity interpretation: <1.8 ft/sec, indicate of risk for recurrent falls   General Gait Details: minA progressing to min guard with distance, mildly unsteady, no overt LoB, vc for upright posture     Modified Rankin (Stroke Patients Only) Modified Rankin (Stroke Patients Only) Pre-Morbid Rankin Score: Slight disability Modified Rankin: Slight disability     Balance Overall balance assessment: Mild deficits observed, not formally tested                                           Pertinent Vitals/Pain Pain Assessment Pain Assessment: No/denies pain    Home Living Family/patient expects to be discharged to:: Private residence Living Arrangements: Children Available Help at Discharge: Available PRN/intermittently Type of Home: Apartment Home Access: Level entry  Home Layout: One level Home Equipment: Conservation officer, nature (2 wheels);Cane - single point;Shower seat;Grab bars - tub/shower;Hand held shower head Additional Comments:  Daughter is home from college for the summer, but works    Prior Function Prior Level of Function : Needs assist       Physical Assist : ADLs (physical)   ADLs (physical): IADLs;Bathing;Dressing Mobility Comments: ambulates with cane at baseline, with limited community ambulation ADLs Comments: has HHAide M/T 2 hours, W/Th/F 2:45 hrs     Hand Dominance   Dominant Hand: Right    Extremity/Trunk Assessment   Upper Extremity Assessment Upper Extremity Assessment: Defer to OT evaluation    Lower Extremity Assessment Lower Extremity Assessment: LLE deficits/detail LLE Deficits / Details: residual deficits from prior CVA, AROM, WFL, strength grossly 3/5 LLE Sensation: decreased light touch;decreased proprioception LLE Coordination: decreased fine motor       Communication   Communication: Expressive difficulties (mild slurred, slow, speech)  Cognition Arousal/Alertness: Awake/alert Behavior During Therapy: WFL for tasks assessed/performed, Flat affect Overall Cognitive Status: Within Functional Limits for tasks assessed                                          General Comments General comments (skin integrity, edema, etc.): pt reports baseline level of mobility, only c/o continued numbness and tingling in L face        Assessment/Plan    PT Assessment Patient needs continued PT services  PT Problem List Decreased balance;Decreased mobility;Decreased coordination;Impaired sensation       PT Treatment Interventions Gait training;Functional mobility training;Therapeutic activities;Therapeutic exercise;Balance training;Patient/family education    PT Goals (Current goals can be found in the Care Plan section)  Acute Rehab PT Goals Patient Stated Goal: not have any more CVA PT Goal Formulation: With patient Time For Goal Achievement: 11/25/21 Potential to Achieve Goals: Fair    Frequency Min 3X/week        AM-PAC PT "6 Clicks" Mobility  Outcome  Measure Help needed turning from your back to your side while in a flat bed without using bedrails?: None Help needed moving from lying on your back to sitting on the side of a flat bed without using bedrails?: None Help needed moving to and from a bed to a chair (including a wheelchair)?: None Help needed standing up from a chair using your arms (e.g., wheelchair or bedside chair)?: None Help needed to walk in hospital room?: A Little Help needed climbing 3-5 steps with a railing? : A Little 6 Click Score: 22    End of Session Equipment Utilized During Treatment: Gait belt Activity Tolerance: Patient tolerated treatment well Patient left: in bed;with call bell/phone within reach;with bed alarm set Nurse Communication: Mobility status PT Visit Diagnosis: Unsteadiness on feet (R26.81);Muscle weakness (generalized) (M62.81);History of falling (Z91.81);Other symptoms and signs involving the nervous system (D66.440)    Time: 3474-2595 PT Time Calculation (min) (ACUTE ONLY): 21 min   Charges:   PT Evaluation $PT Eval Moderate Complexity: 1 Mod          Meghan Tiemann B. Migdalia Dk PT, DPT Acute Rehabilitation Services Please use secure chat or  Call Office [phone]   [place] 11/11/2021, 10:46 AM

## 2021-11-11 NOTE — Discharge Summary (Signed)
Physician Discharge Summary   Patient: Christina Orozco MRN: [id number] DOB: 05-17-1971  Admit date:     11/10/2021  Discharge date: 11/11/21  Discharge Physician: Corrie Mckusick Haim Hansson   PCP: Jenel Lucks, PA-C   Recommendations at discharge:   Follow-up with  primary care provider in 1 to 2 weeks. Follow-up with neurology as outpatient when scheduled by the clinic.  Discharge Diagnoses: Principal Problem:   Stroke [facility]) Active Problems:   Anemia   Hypertension   Diabetes mellitus ([place])   CKD (chronic kidney disease)  Resolved Problems:   * No resolved hospital problems. *  Hospital Course: Christina Orozco is a 51 y.o. female with past medical history significant for anemia, anxiety, asthma, chronic kidney disease stage IV, depression, diabetes mellitus, hypertension, peripheral vascular disease, recurrent stroke presented hospital with left facial numbness.  Patient does have a history of chronic dysphagia and dysarthria from previous stroke with gait imbalance.  In the ED sodium 144, potassium 4.4, chloride 113, glucose 126, BUN 37, creatinine 2.8, liver function test normal, hemoglobin 10, pregnancy test negative, alcohol level less than 10.  Patient was then admitted hospital for acute stroke.  Assessment/Plan  Principal Problem:   Stroke [facility]) Active Problems:   Anemia   Hypertension   Diabetes mellitus ([place])   CKD (chronic kidney disease)   Right cerebral peduncle stroke Presented with left-sided facial numbness.  MRI shows diffusion restriction in the right cerebral peduncle with findings of previous stroke. Patient was on permissive hypertension.   Seen by neurology.  2D echocardiogram showed LV ejection fraction of 70 to 75% with no regional wall motion abnormality.  Hemoglobin A1c at 6.5.  Lipid panel with LDL of 34.  Ferritin at 124.  Continue high intensity Lipitor, aspirin.  Carotid duplex is pending.  At this time, neurology has recommended aspirin and  Brilinta for next 30 days followed by aspirin and Plavix.  Communicated with neurology prior to discharge.   Essential hypertension. Currently on permissive hypertension.  On Coreg hydralazine and Norvasc as outpatient.    Diabetes mellitus type 2.  Latest hemoglobin A1c of 6.5.  Will need better control.  On Ozempic at home.  Patient received sliding scale insulin while in the hospital.  Chronic dysphagia.  On a regular consistency diet at home.  CKD stage IV.  Previous creatinine at baseline around 2.6-2.9.  Latest creatinine at 2.8.  Remote history of seizures.   not on this medication at home  History of anemia likely secondary to chronic kidney disease.  Ferritin and iron within normal limits.  Consultants: Neurology Procedures performed: None Disposition: Home Diet recommendation:  Discharge Diet Orders (From admission, onward)     Start     Ordered   11/11/21 0000  Diet - low sodium heart healthy        11/11/21 1538           Cardiac and Carb modified diet DISCHARGE MEDICATION: Allergies as of 11/11/2021       Reactions   Lisinopril Swelling, Other (See Comments)   Facial swelling    Penicillins Hives   Has patient had a PCN reaction causing immediate rash, facial/tongue/throat swelling, SOB or lightheadedness with hypotension: yes Has patient had a PCN reaction causing severe rash involving mucus membranes or skin necrosis: No Has patient had a PCN reaction that required hospitalization No Has patient had a PCN reaction occurring within the last 10 years: No If all of the above answers are "NO", then may proceed wit  Gabapentin Swelling, Other (See Comments)   "I thought I was having another stroke."         Medication List     STOP taking these medications    clopidogrel 75 MG tablet Commonly known as: PLAVIX       TAKE these medications    albuterol 108 (90 Base) MCG/ACT inhaler Commonly known as: ProAir HFA INHALE 1 OR 2 PUFFS INTO THE LUNGS  EVERY 6 HOURS AS NEEDED FOR WHEEZING OR SHORTNESS OF BREATH What changed:  how much to take how to take this when to take this reasons to take this additional instructions   amLODipine 5 MG tablet Commonly known as: NORVASC Take 1 tablet (5 mg total) by mouth daily.   aspirin EC 81 MG tablet Take 81 mg by mouth daily.   atorvastatin 80 MG tablet Commonly known as: LIPITOR Take 80 mg by mouth at bedtime.   carboxymethylcellulose 0.5 % Soln Commonly known as: REFRESH PLUS Place 1 drop into both eyes daily as needed (dry eyes).   carvedilol 12.5 MG tablet Commonly known as: COREG Take 1 tablet (12.5 mg total) by mouth 2 (two) times daily with a meal.   diclofenac Sodium 1 % Gel Commonly known as: VOLTAREN Apply 2 g topically daily as needed (pain).   DULoxetine 60 MG capsule Commonly known as: CYMBALTA Take 60 mg by mouth at bedtime.   furosemide 20 MG tablet Commonly known as: LASIX TAKE ALTERNATING 1 TABLET AND 2 TABLETS BY MOUTH DAILY What changed: See the new instructions.   glucose blood test strip Commonly known as: Contour Next Test 1 each by Other route 2 (two) times daily. And lancets 2/day What changed: additional instructions   hydrALAZINE 50 MG tablet Commonly known as: APRESOLINE Take 1 tablet (50 mg total) by mouth in the morning and at bedtime.   levETIRAcetam 500 MG tablet Commonly known as: KEPPRA TAKE 1 TABLET(500 MG) BY MOUTH TWICE DAILY What changed:  how much to take how to take this when to take this   meclizine 25 MG tablet Commonly known as: ANTIVERT Take 1 tablet (25 mg total) by mouth 3 (three) times daily as needed for dizziness.   Ozempic (0.25 or 0.5 MG/DOSE) 2 MG/1.5ML Sopn Generic drug: Semaglutide(0.25 or 0.5MG /DOS) Inject 0.5 mg into the skin See admin instructions. Taking every 10 days   pantoprazole 40 MG tablet Commonly known as: PROTONIX TAKE 1 TABLET(40 MG) BY MOUTH DAILY What changed: See the new instructions.    ticagrelor 90 MG Tabs tablet Commonly known as: Brilinta Take 1 tablet (90 mg total) by mouth 2 (two) times daily.   traZODone 50 MG tablet Commonly known as: DESYREL Take 50 mg by mouth at bedtime as needed for sleep.   Vitamin D3 50 MCG (2000 UT) capsule Take 1 capsule (2,000 Units total) by mouth daily. What changed:  medication strength how much to take        Follow-up Information     Jenel Lucks, PA-C Follow up in 1 week(s).   Specialty: Internal Medicine Contact information: [place] [address] [phone]         Skeet Latch, MD .   Specialty: Cardiology Contact information: [address] [place] [address] [phone]                Subjective: Today, patient was seen and examined at bedside.  Still complains of facial numbness.  Discharge Exam: There were no vitals  filed for this visit.    11/11/2021    4:41 PM 11/11/2021   11:25 AM 11/11/2021    7:44 AM  Vitals with BMI  Systolic [phone]  Diastolic 95 89 79  Pulse 81 77 82   There is no height or weight on file to calculate BMI.  General:  Average built, not in obvious distress HENT:   No scleral pallor or icterus noted. Oral mucosa is moist.  Chest:  Clear breath sounds.  Diminished breath sounds bilaterally. No crackles or wheezes.  CVS: S1 &S2 heard. No murmur.  Regular rate and rhythm. Abdomen: Soft, nontender, nondistended.  Bowel sounds are heard.   Extremities: No cyanosis, clubbing or edema.  Peripheral pulses are palpable. Psych: Alert, awake and oriented, normal mood CNS: Mild decrease sensation over the left face.  Power equal in all extremities.   Skin: Warm and dry.  No rashes noted.   Condition at discharge: good  The results of significant diagnostics from this hospitalization (including imaging, microbiology, ancillary and laboratory) are listed below for reference.   Imaging Studies: CT Head Wo  Contrast  Result Date: 10/21/2021 CLINICAL DATA:  Mental status change, unknown cause EXAM: CT HEAD WITHOUT CONTRAST TECHNIQUE: Contiguous axial images were obtained from the base of the skull through the vertex without intravenous contrast. RADIATION DOSE REDUCTION: This exam was performed according to the departmental dose-optimization program which includes automated exposure control, adjustment of the mA and/or kV according to patient size and/or use of iterative reconstruction technique. COMPARISON:  CT head January 15, 2021.  MRI head January 16, 2021. FINDINGS: Brain: No evidence of acute large vascular territory infarction, hemorrhage, hydrocephalus, extra-axial collection or mass lesion/mass effect. Remote bilateral thalamic infarcts. Remote left corpus callosum body infarct. Additional patchy white matter hypodensities, nonspecific but compatible with chronic microvascular ischemic disease Vascular: No hyperdense vessel identified. Calcific atherosclerosis. Skull: No acute fracture. Sinuses/Orbits: Small amount of frothy secretions in the right sphenoid sinus. No acute orbital findings. Other: No mastoid effusions. IMPRESSION: 1. No evidence of acute intracranial abnormality. 2. Remote infarcts and chronic microvascular ischemic disease. Electronically Signed   By: Margaretha Sheffield M.D.   On: 10/21/2021 12:28   MR ANGIO HEAD WO CONTRAST  Result Date: 11/11/2021 CLINICAL DATA:  51 year old female with numbness. Suspected right cerebral peduncle small-vessel ischemia on MRI yesterday. EXAM: MRA HEAD WITHOUT CONTRAST TECHNIQUE: Angiographic images of the Circle of Willis were acquired using MRA technique without intravenous contrast. COMPARISON:  Brain MRI 11/10/2021 and earlier. Intracranial MRA 06/20/2020. FINDINGS: Anterior circulation: Stable antegrade flow in both ICA siphons. Mild siphon irregularity but no significant siphon stenosis. Patent carotid termini, MCA and ACA origins. Normal anterior  communicating artery. MCA M1 segments and bi/trifurcations appear patent without stenosis. Visible bilateral MCA and ACA branches are stable and within normal limits. Posterior circulation: Stable antegrade flow in the posterior circulation with dominant left vertebral V4 segment. Patent PICA origins. No distal vertebral or basilar artery stenosis. Patent SCA and PCA origins. Posterior communicating arteries are diminutive or absent. Bilateral PCA branches are within normal limits. Anatomic variants: Dominant left vertebral artery. Other: No intracranial mass effect or ventriculomegaly. Evidence of chronic small vessel disease redemonstrated. IMPRESSION: Stable since 2020 and Negative intracranial MRA. Electronically Signed   By: Genevie Ann M.D.   On: 11/11/2021 11:24   MR BRAIN WO CONTRAST  Result Date: 11/10/2021 CLINICAL DATA:  Left-sided facial numbness starting last night, history of strokes EXAM: MRI HEAD WITHOUT CONTRAST TECHNIQUE: Multiplanar,  multiecho pulse sequences of the brain and surrounding structures were obtained without intravenous contrast. COMPARISON:  Brain MRI 10/21/2021 FINDINGS: Brain: There is a small focus of linear diffusion restriction in the right cerebral peduncle with corresponding low ADC signal consistent with acute ischemia. There is no associated hemorrhage or mass effect. There is no other evidence of acute infarct. There is no acute intracranial hemorrhage or extra-axial fluid collection. Background parenchymal volume is normal. The ventricles are normal in size. Numerous small remote infarcts are seen in the bilateral basal ganglia, thalami, left corpus callosum, brainstem, and right cerebellum. Additional confluent FLAIR signal abnormality throughout the subcortical and periventricular white matter and pons is consistent with markedly age advanced chronic white matter microangiopathy. There are numerous punctate chronic microhemorrhages predominantly centrally located, likely  hypertensive in etiology. There is no solid mass lesion. There is no mass effect or midline shift. Vascular: Normal flow voids. Skull and upper cervical spine: Normal marrow signal. Sinuses/Orbits: Paranasal sinuses are clear. A left lens implant is noted. The globes and orbits are otherwise unremarkable. 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 IMPRESSION: 1. Small linear focus of diffusion restriction in the right cerebral peduncle consistent with acute ischemia. No associated hemorrhage or mass effect. 2. Multiple additional remote infarcts, chronic microhemorrhages, and background advanced chronic white matter microangiopathy, unchanged. Electronically Signed   By: Valetta Mole M.D.   On: 11/10/2021 14:40   MR BRAIN WO CONTRAST  Result Date: 10/21/2021 CLINICAL DATA:  Neuro deficit, acute, stroke suspected EXAM: MRI HEAD WITHOUT CONTRAST TECHNIQUE: Multiplanar, multiecho pulse sequences of the brain and surrounding structures were obtained without intravenous contrast. COMPARISON:  CT head from the same day. FINDINGS: Brain: No acute infarction, acute hemorrhage, hydrocephalus, extra-axial collection or mass lesion. Redemonstration of multiple chronic lacunar infarcts in the brainstem and deep gray nuclei. Additional T2/FLAIR hyperintensities within the supratentorial and pontine white matter, nonspecific but compatible with chronic microvascular ischemic disease that is advanced for age. Many small foci of susceptibility artifact in the brainstem, basal ganglia, right thalamus and a few in the cerebrum, compatible with chronic microhemorrhages better probably hypertensive in etiology. Vascular: Major arterial flow voids are maintained skull base. Skull and upper cervical spine: Normal marrow signal. Sinuses/Orbits: Clear sinuses.  No acute orbital findings. Other: No mastoid effusions. IMPRESSION: 1. No evidence of acute intracranial abnormality. 2. Remote lacunar infarcts and chronic  microvascular ischemic disease that is advanced for age. 3. Chronic microhemorrhages, likely hypertensive in etiology. Electronically Signed   By: Margaretha Sheffield M.D.   On: 10/21/2021 16:16   ECHOCARDIOGRAM COMPLETE  Result Date: 11/11/2021    ECHOCARDIOGRAM REPORT   Patient Name:   SEMAJA LYMON Date of Exam: 11/11/2021 Medical Rec #:  [id number]        Height:       62.0 in Accession #:    [id number]       Weight:       151.0 lb Date of Birth:  [birth year]       BSA:          1.697 m Patient Age:    77 years         BP:           164/79 mmHg Patient Gender: F                HR:           73 bpm. Exam Location:  Inpatient Procedure: 2D Echo, Cardiac Doppler and Color  Doppler Indications:    Stroke  History:        Patient has prior history of Echocardiogram examinations, most                 recent 06/21/2020. Risk Factors:Diabetes and Hypertension.  Sonographer:    Jefferey Pica Referring Phys: [phone] BELKYS A REGALADO IMPRESSIONS  1. Left ventricular ejection fraction, by estimation, is 60 to 65%. The left ventricle has normal function. The left ventricle has no regional wall motion abnormalities. There is moderate asymmetric left ventricular hypertrophy of the basal-septal segment. Left ventricular diastolic parameters are indeterminate.  2. Right ventricular systolic function is normal. The right ventricular size is normal. There is normal pulmonary artery systolic pressure.  3. The mitral valve is normal in structure. No evidence of mitral valve regurgitation.  4. The aortic valve is normal in structure. Aortic valve regurgitation is not visualized.  5. Aortic no significant ascending aneurysm. Comparison(s): No significant change from prior study. FINDINGS  Left Ventricle: Left ventricular ejection fraction, by estimation, is 60 to 65%. The left ventricle has normal function. The left ventricle has no regional wall motion abnormalities. The left ventricular internal cavity size was normal in size. There  is  moderate asymmetric left ventricular hypertrophy of the basal-septal segment. Left ventricular diastolic parameters are indeterminate. Right Ventricle: The right ventricular size is normal. No increase in right ventricular wall thickness. Right ventricular systolic function is normal. There is normal pulmonary artery systolic pressure. The tricuspid regurgitant velocity is 2.16 m/s, and  with an assumed right atrial pressure of 3 mmHg, the estimated right ventricular systolic pressure is 28.3 mmHg. Left Atrium: Left atrial size was normal in size. Right Atrium: Right atrial size was normal in size. Pericardium: There is no evidence of pericardial effusion. Mitral Valve: The mitral valve is normal in structure. No evidence of mitral valve regurgitation. Tricuspid Valve: The tricuspid valve is normal in structure. Tricuspid valve regurgitation is trivial. Aortic Valve: The aortic valve is normal in structure. Aortic valve regurgitation is not visualized. Aortic valve peak gradient measures 4.2 mmHg. Pulmonic Valve: The pulmonic valve was not well visualized. Pulmonic valve regurgitation is not visualized. Aorta: No significant ascending aneurysm. IAS/Shunts: No atrial level shunt detected by color flow Doppler.  LEFT VENTRICLE PLAX 2D LVIDd:         3.70 cm   Diastology LVIDs:         2.10 cm   LV e' medial:    5.12 cm/s LV PW:         1.60 cm   LV E/e' medial:  11.6 LV IVS:        1.50 cm   LV e' lateral:   5.57 cm/s LVOT diam:     2.10 cm   LV E/e' lateral: 10.7 LV SV:         54 LV SV Index:   32 LVOT Area:     3.46 cm  RIGHT VENTRICLE             IVC RV S prime:     10.05 cm/s  IVC diam: 1.70 cm TAPSE (M-mode): 2.0 cm LEFT ATRIUM             Index        RIGHT ATRIUM           Index LA diam:        3.00 cm 1.77 cm/m   RA Area:     12.10 cm LA Vol (  A2C):   60.6 ml 35.72 ml/m  RA Volume:   26.00 ml  15.33 ml/m LA Vol (A4C):   32.7 ml 19.27 ml/m LA Biplane Vol: 44.8 ml 26.41 ml/m  AORTIC VALVE                  PULMONIC VALVE AV Area (Vmax): 3.19 cm     PV Vmax:       0.76 m/s AV Vmax:        102.00 cm/s  PV Peak grad:  2.3 mmHg AV Peak Grad:   4.2 mmHg LVOT Vmax:      94.00 cm/s LVOT Vmean:     51.500 cm/s LVOT VTI:       0.156 m  AORTA Ao Root diam: 3.20 cm Ao Asc diam:  3.00 cm MITRAL VALVE               TRICUSPID VALVE MV Area (PHT): 3.74 cm    TR Peak grad:   18.7 mmHg MV Decel Time: 203 msec    TR Vmax:        216.00 cm/s MV E velocity: 59.60 cm/s MV A velocity: 61.70 cm/s  SHUNTS MV E/A ratio:  0.97        Systemic VTI:  0.16 m                            Systemic Diam: 2.10 cm Phineas Inches Electronically signed by Phineas Inches Signature Date/Time: 11/11/2021/10:51:13 AM    Final     Microbiology: Results for orders placed or performed during the hospital encounter of 10/21/21  Resp Panel by RT-PCR (Flu A&B, Covid) Nasopharyngeal Swab     Status: None   Collection Time: 10/21/21 10:32 AM   Specimen: Nasopharyngeal Swab; Nasopharyngeal(NP) swabs in vial transport medium  Result Value Ref Range Status   SARS Coronavirus 2 by RT PCR NEGATIVE NEGATIVE Final    Comment: (NOTE) SARS-CoV-2 target nucleic acids are NOT DETECTED.  The SARS-CoV-2 RNA is generally detectable in upper respiratory specimens during the acute phase of infection. The lowest concentration of SARS-CoV-2 viral copies this assay can detect is 138 copies/mL. A negative result does not preclude SARS-Cov-2 infection and should not be used as the sole basis for treatment or other patient management decisions. A negative result may occur with  improper specimen collection/handling, submission of specimen other than nasopharyngeal swab, presence of viral mutation(s) within the areas targeted by this assay, and inadequate number of viral copies(<138 copies/mL). A negative result must be combined with clinical observations, patient history, and epidemiological information. The expected result is Negative.  Fact Sheet for Patients:   EntrepreneurPulse.com.au  Fact Sheet for Healthcare Providers:  IncredibleEmployment.be  This test is no t yet approved or cleared by the [place] FDA and  has been authorized for detection and/or diagnosis of SARS-CoV-2 by FDA under an Emergency Use Authorization (EUA). This EUA will remain  in effect (meaning this test can be used) for the duration of the COVID-19 declaration under Section 564(b)(1) of the Act, 21 U.S.C.section 360bbb-3(b)(1), unless the authorization is terminated  or revoked sooner.       Influenza A by PCR NEGATIVE NEGATIVE Final   Influenza B by PCR NEGATIVE NEGATIVE Final    Comment: (NOTE) The Xpert Xpress SARS-CoV-2/FLU/RSV plus assay is intended as an aid in the diagnosis of influenza from Nasopharyngeal swab specimens and should not be used as a sole basis for  treatment. Nasal washings and aspirates are unacceptable for Xpert Xpress SARS-CoV-2/FLU/RSV testing.  Fact Sheet for Patients: EntrepreneurPulse.com.au  Fact Sheet for Healthcare Providers: IncredibleEmployment.be  This test is not yet approved or cleared by the [place] FDA and has been authorized for detection and/or diagnosis of SARS-CoV-2 by FDA under an Emergency Use Authorization (EUA). This EUA will remain in effect (meaning this test can be used) for the duration of the COVID-19 declaration under Section 564(b)(1) of the Act, 21 U.S.C. section 360bbb-3(b)(1), unless the authorization is terminated or revoked.  Performed at [facility], [place] [address] [place], [place] [address]     Labs: CBC: Recent Labs  Lab 11/10/21 1231 11/10/21 1901  WBC 4.7 5.1  NEUTROABS 2.5  --   HGB 10.0* 10.5*  HCT 31.1* 32.7*  MCV 90.9 88.6  PLT 289 336   Basic Metabolic Panel: Recent Labs  Lab 11/10/21 1148 11/10/21 1901  NA 144  --   K 4.4  --   CL 113*  --   CO2 24  --   GLUCOSE  126*  --   BUN 37*  --   CREATININE 2.86* 2.61*  CALCIUM 9.5  --    Liver Function Tests: Recent Labs  Lab 11/10/21 1148  AST 19  ALT 19  ALKPHOS 58  BILITOT 0.5  PROT 7.5  ALBUMIN 4.0   CBG: Recent Labs  Lab 11/10/21 1848 11/10/21 2335 11/11/21 0622 11/11/21 1115 11/11/21 1643  GLUCAP 83 128* 104* 102* 95    Discharge time spent: greater than 30 minutes.  Signed: Flora Lipps, MD [facility] 11/11/2021

## 2021-11-11 NOTE — Progress Notes (Addendum)
STROKE TEAM PROGRESS NOTE   INTERVAL HISTORY No family at the bedside. She is compliant with her aspirin and plavix and she is agreeable to switch to aspirin and brilinta for 30 days and then go back to taking aspirin and plavix. MRA stable. Follows with [facility] and has an appointment 11/16/2021. She has previously seen providers and [facility] and [facility].  She reports 2 episodes in May of memory loss lasting for multiple hours. She states her daughters told her she kept repeating herself and she seemed confused. She does not remember this incident, she was seen in the ED and she followed up with her PCP who discontinued her medication for her overactive bladder. Her last dose of Vesicare was on 5/26.   Vitals:   11/10/21 1955 11/11/21 0014 11/11/21 0426 11/11/21 0744  BP: (!) 182/77 127/67 (!) 155/80 (!) 164/79  Pulse: 69 73 72 82  Resp: 18 18 18 16   Temp: 98.1 F (36.7 C) 98.2 F (36.8 C) 98.4 F (36.9 C) 97.9 F (36.6 C)  TempSrc: Oral Oral Oral Oral  SpO2: 100% 100% 100% 100%   CBC:  Recent Labs  Lab 11/10/21 1231 11/10/21 1901  WBC 4.7 5.1  NEUTROABS 2.5  --   HGB 10.0* 10.5*  HCT 31.1* 32.7*  MCV 90.9 88.6  PLT 289 981   Basic Metabolic Panel:  Recent Labs  Lab 11/10/21 1148 11/10/21 1901  NA 144  --   K 4.4  --   CL 113*  --   CO2 24  --   GLUCOSE 126*  --   BUN 37*  --   CREATININE 2.86* 2.61*  CALCIUM 9.5  --    Lipid Panel:  Recent Labs  Lab 11/11/21 0341  CHOL 97  TRIG 87  HDL 46  CHOLHDL 2.1  VLDL 17  LDLCALC 34   HgbA1c:  Recent Labs  Lab 11/10/21 1901  HGBA1C 6.5*   Urine Drug Screen: No results for input(s): LABOPIA, COCAINSCRNUR, LABBENZ, AMPHETMU, THCU, LABBARB in the last 168 hours.  Alcohol Level  Recent Labs  Lab 11/10/21 1148  ETH <10    IMAGING past 24 hours MR BRAIN WO CONTRAST  Result Date: 11/10/2021 CLINICAL DATA:  Left-sided facial numbness starting last night, history of strokes EXAM: MRI HEAD WITHOUT CONTRAST  TECHNIQUE: Multiplanar, multiecho pulse sequences of the brain and surrounding structures were obtained without intravenous contrast. COMPARISON:  Brain MRI 10/21/2021 FINDINGS: Brain: There is a small focus of linear diffusion restriction in the right cerebral peduncle with corresponding low ADC signal consistent with acute ischemia. There is no associated hemorrhage or mass effect. There is no other evidence of acute infarct. There is no acute intracranial hemorrhage or extra-axial fluid collection. Background parenchymal volume is normal. The ventricles are normal in size. Numerous small remote infarcts are seen in the bilateral basal ganglia, thalami, left corpus callosum, brainstem, and right cerebellum. Additional confluent FLAIR signal abnormality throughout the subcortical and periventricular white matter and pons is consistent with markedly age advanced chronic white matter microangiopathy. There are numerous punctate chronic microhemorrhages predominantly centrally located, likely hypertensive in etiology. There is no solid mass lesion. There is no mass effect or midline shift. Vascular: Normal flow voids. Skull and upper cervical spine: Normal marrow signal. Sinuses/Orbits: Paranasal sinuses are clear. A left lens implant is noted. The globes and orbits are otherwise unremarkable. 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 IMPRESSION: 1. Small linear focus of diffusion restriction in the right cerebral  peduncle consistent with acute ischemia. No associated hemorrhage or mass effect. 2. Multiple additional remote infarcts, chronic microhemorrhages, and background advanced chronic white matter microangiopathy, unchanged. Electronically Signed   By: Valetta Mole M.D.   On: 11/10/2021 14:40    PHYSICAL EXAM Constitutional: Appears well-developed and well-nourished.  Cardiovascular: Normal rate and regular rhythm. Perfusing extremities well Respiratory: Effort normal, non-labored breathing    Neuro: Mental Status: Patient is awake, alert, oriented to person, place, month, year, and situation. Patient is able to give a clear and coherent history. No signs of aphasia or neglect Cranial Nerves: II: Visual Fields are full.  Left pupil is 2 mm and minimally reactive to light, right pupil is reactive 2 mm to 1 mm and has moderately visible cataract III,IV, VI: EOMI without ptosis or diploplia.  She does have direction changing nystagmus as well as a very saccadic pursuits V: Facial sensation is reduced on the left face, endorses numbness and tingling VII: Facial movement is symmetric.  VIII: hearing intact to voice X: Palate elevates symmetrically XI: Shoulder shrug is symmetric. XII: tongue is midline without atrophy or fasciculations.  Motor: Tone is normal. Bulk is normal. 5/5 strength was present in all four extremities.  Sensory: Sensation is intact bilaterally Cerebellar: FNF and HKS are intact bilaterally   ASSESSMENT/PLAN [name] is a 51 y.o. female with history of hypertension, hyperlipidemia, diabetes, chronic kidney disease, prior strokes, history of seizures possibly secondary to PRES (2019) versus underlying epilepsy presenting with tingling in the left face.   Stroke:  Acute small right cerebral peduncle infarct likely again secondary small vessel disease source MRI  new small right cerebral peduncle infarct MRA  Stable since 2020 and Negative intracranial MRA. Carotid Doppler unremarkable 2D Echo EF 60-65% LDL 34 HgbA1c 6.5 VTE prophylaxis - heparin 5000u q8 aspirin 81 mg daily and clopidogrel 75 mg daily prior to admission, now on aspirin 81 mg daily and Brilinta (ticagrelor) 90 mg bid for thirty days and then aspirin and plavix as previously prescribed.  Therapy recommendations:  None Disposition: Home today  Hx stroke/TIA 07/2017 MRI showed left, EF 65 to 70%, carotid Doppler and MRA negative.  LDL 43, A1c 12.5, patient put on DAPT and  Lipitor 40. 08/2017 Loop recorder placed, no A. fib found 02/27/2019 woke up with complete vision loss, gradually vision resumed OD, still has better vision OS.  MRI at that time showed 3 mm acute to early subacute infarct in the dorsal midline of the pons negative MRI head and neck. 07/2019 loop recorder explanted 05/01/2020 MRI showed new T2 hyperintense lesion of the right frontal white matter likely reflect an area of late subacute or chronic infarction. 06/19/2020 Subcentimeter likely subacute infarct at the left lateral aspect of the pons.  MRA head unremarkable, carotid Doppler unremarkable, EF 70 to 75%.  LDL 63, A1c 6.2.  Put on aspirin and Brilinta for 1 months and then back to aspirin and the Plavix.  Continue Lipitor 80. Now follows with [facility]  Hypertension Home meds:  hydralazine, norvasc, coreg, lasix,  Stable Long-term BP goal normotensive  Hyperlipidemia Home meds:  atorvastatin 80mg , resumed in hospital LDL 34, goal < 70 Continue statin at discharge  Diabetes type II Controlled Home meds:  semaglutide HgbA1c 6.5, goal < 7.0 CBGs SSI Close PCP follow-up for continued DM control  Hx of seizure 03/2018 new onset seizure, discharged with Keppra Off [place] 05/2021, so far no recurrence Follow-up with [facility]  Other Stroke Risk Factors  Obesity, There is no height or weight on file to calculate BMI., BMI >/= 30 associated with increased stroke risk, recommend weight loss, diet and exercise as appropriate   Other Active Problems CKD stage 4 Cr 2.86 -> 2.61 GFR 22  Hospital day # 0  Patient seen and examined by NP/APP with MD. MD to update note as needed.   Janine Ores, DNP, FNP-BC [facility] Pager: [phone]  ATTENDING NOTE: I reviewed above note and agree with the assessment and plan. Pt was seen and examined.   51 year old female with history of hypertension, hyperlipidemia, diabetes, CKD 4, seizure now off Keppra, several  previous lacunar infarcts admitted for left facial tingling.  Patient stated that she had acute onset left facial, tongue tingling on Wednesday.  Symptoms persist until now.  MRI showed possible small right cerebral peduncle infarcts.  MRA, carotid Doppler, 2D echo unremarkable.  LDL 34, A1c 6.5.  Creatinine 2.86-2.61.  BP mildly elevated.  She is compliant with aspirin and Plavix at home.  Neuro examination intact except left V1, V2 V3 decreased light touch sensation.  Etiology of patient stroke again consistent with small vessel disease.  However, patient risk factor mostly in good control except mildly elevated BP and CKD 4.  Recommend to continue risk factor modification, put on aspirin and Brilinta 90 twice daily for 30 days and then go back to aspirin Plavix.  Continue Lipitor 80.  PT/OT no recommendation.  Patient follow-up with [facility] on 11/16/2021.  For detailed assessment and plan, please refer to above as I have made changes wherever appropriate.   Neurology will sign off. Please call with questions. Pt will follow up with [facility] on 11/16/2021. Thanks for the consult.   [name], MD PhD Stroke Neurology 11/11/2021 5:26 PM    To contact Stroke Continuity provider, please refer to [email or web address] After hours, contact General Neurology

## 2021-11-11 NOTE — Progress Notes (Signed)
Carotid artery duplex completed. Refer to "CV Proc" under chart review to view preliminary results.  11/11/2021 4:57 PM Kelby Aline., MHA, RVT, RDCS, RDMS

## 2021-11-11 NOTE — Progress Notes (Signed)
CSW provided patient with Brillinta 30 day free coupon. No other needs identified at this time.  Laveda Abbe, [place] Clinical Social Worker [phone]

## 2021-11-11 NOTE — Evaluation (Signed)
Occupational Therapy Evaluation Patient Details Name: Christina Orozco MRN: [id number] DOB: 1970-08-31 Today's Date: 11/11/2021   History of Present Illness 51 y.o. female presents to ED 11/10/21 for stroke evaluation after experiencing L face tingling. MRI reveals Small linear focus of diffusion restriction in the right cerebral peduncle consistent with acute ischemia. BP noted to be significatntly elevated at 183/100 admitted for observation. PMH: hypertension, hyperlipidemia, diabetes, chronic kidney disease, prior strokes, history of seizures possibly secondary to PRES (2019) versus underlying epilepsy   Clinical Impression   PTA patient independent with ADLs (bathing with supervision), driving, managing meds but has assist with IADls from aide. Admitted for above and limited by problem list below, including impaired balance and decreased activity tolerance. Patient oriented and follows commands, demonstrates decreased sequencing and difficulty with dual cognitive tasks but unsure if this is baseline. Patient educated on recommendations on assist with IADLs and driving at this time, pt voiced understanding.  Patient completes ADLs with supervision, mobility with min guard to supervision using cane.  Will follow acutely, anticipate no further OT upon dc home.      Recommendations for follow up therapy are one component of a multi-disciplinary discharge planning process, led by the attending physician.  Recommendations may be updated based on patient status, additional functional criteria and insurance authorization.   Follow Up Recommendations  No OT follow up    Assistance Recommended at Discharge Intermittent Supervision/Assistance  Patient can return home with the following A little help with walking and/or transfers;A little help with bathing/dressing/bathroom;Assistance with cooking/housework;Direct supervision/assist for financial management;Direct supervision/assist for medications  management;Assist for transportation    Functional Status Assessment  Patient has had a recent decline in their functional status and demonstrates the ability to make significant improvements in function in a reasonable and predictable amount of time.  Equipment Recommendations  None recommended by OT    Recommendations for Other Services       Precautions / Restrictions Precautions Precautions: Fall Precaution Comments: fall last month Restrictions Weight Bearing Restrictions: No      Mobility Bed Mobility Overal bed mobility: Modified Independent                  Transfers                          Balance Overall balance assessment: Mild deficits observed, not formally tested                                         ADL either performed or assessed with clinical judgement   ADL Overall ADL's : Needs assistance/impaired     Grooming: Set up;Standing           Upper Body Dressing : Supervision/safety;Set up;Sitting   Lower Body Dressing: Supervision/safety;Sit to/from stand   Toilet Transfer: Supervision/safety;Ambulation Toilet Transfer Details (indicate cue type and reason): using cane         Functional mobility during ADLs: Min guard;Supervision/safety;Cane       Vision   Vision Assessment?: No apparent visual deficits     Perception     Praxis      Pertinent Vitals/Pain Pain Assessment Pain Assessment: No/denies pain     Hand Dominance Right   Extremity/Trunk Assessment Upper Extremity Assessment Upper Extremity Assessment: LUE deficits/detail LUE Deficits / Details: residual deficits from prior CVA with decreased [place], functional LUE  Coordination: decreased fine motor   Lower Extremity Assessment Lower Extremity Assessment: LLE deficits/detail LLE Deficits / Details: residual deficits from prior CVA, AROM, WFL, strength grossly 3/5 LLE Sensation: decreased light touch;decreased proprioception LLE  Coordination: decreased fine motor   Cervical / Trunk Assessment Cervical / Trunk Assessment: Normal   Communication Communication Communication: Expressive difficulties (mild slurred, slow, speech)   Cognition Arousal/Alertness: Awake/alert Behavior During Therapy: WFL for tasks assessed/performed, Flat affect Overall Cognitive Status: No family/caregiver present to determine baseline cognitive functioning                                 General Comments: patient demonstrates difficutly with dual cognitive tasks and sequencing months backwards, maybe baseline since prior CVA. patient aware of safety and reports she will not drive, will have support from family for med mgmt.     General Comments  pt reports baseline level of mobility, only c/o continued numbness and tingling in L face    Exercises     Shoulder Instructions      Home Living Family/patient expects to be discharged to:: Private residence Living Arrangements: Children Available Help at Discharge: Available PRN/intermittently Type of Home: Apartment Home Access: Level entry     Home Layout: One level     Bathroom Shower/Tub: Teacher, early years/pre: Standard Bathroom Accessibility: No   Home Equipment: Conservation officer, nature (2 wheels);Cane - single point;Shower seat;Grab bars - tub/shower;Hand held shower head   Additional Comments: Daughter is home from college for the summer, but works      Prior Functioning/Environment Prior Level of Function : Needs assist       Physical Assist : ADLs (physical)   ADLs (physical): IADLs;Bathing;Dressing Mobility Comments: ambulates with cane at baseline, with limited community ambulation ADLs Comments: has HHAide M/T 2 hours, W/Th/F 2:45 hrs; always has assist with shower, but independnet ADLs and aide assists with IADLs.  pt reports driving and managing her medications        OT Problem List: Decreased activity tolerance;Impaired balance  (sitting and/or standing);Decreased coordination;Impaired sensation      OT Treatment/Interventions: Self-care/ADL training;Therapeutic exercise;DME and/or AE instruction;Therapeutic activities;Patient/family education;Balance training;Cognitive remediation/compensation    OT Goals(Current goals can be found in the care plan section) Acute Rehab OT Goals Patient Stated Goal: home OT Goal Formulation: With patient Time For Goal Achievement: 11/25/21 Potential to Achieve Goals: Good  OT Frequency: Min 2X/week    Co-evaluation              AM-PAC OT "6 Clicks" Daily Activity     Outcome Measure Help from another person eating meals?: None Help from another person taking care of personal grooming?: A Little Help from another person toileting, which includes using toliet, bedpan, or urinal?: A Little Help from another person bathing (including washing, rinsing, drying)?: A Little Help from another person to put on and taking off regular upper body clothing?: A Little Help from another person to put on and taking off regular lower body clothing?: A Little 6 Click Score: 19   End of Session Equipment Utilized During Treatment: Other (comment) (cane) Nurse Communication: Mobility status  Activity Tolerance: Patient tolerated treatment well Patient left: in bed;with call bell/phone within reach;with bed alarm set  OT Visit Diagnosis: Other abnormalities of gait and mobility (R26.89);Other symptoms and signs involving cognitive function  Time: 6270-3500 OT Time Calculation (min): 17 min Charges:  OT General Charges $OT Visit: 1 Visit OT Evaluation $OT Eval Low Complexity: [place], OT Acute Rehabilitation Services Office [phone]   Delight Stare 11/11/2021, 11:37 AM

## 2021-11-12 ENCOUNTER — Other Ambulatory Visit: Payer: Self-pay | Admitting: Internal Medicine

## 2021-11-12 MED ORDER — TICAGRELOR 90 MG PO TABS: 90.0000 mg | ORAL_TABLET | Freq: Two times a day (BID) | ORAL | 0 refills | Status: AC

## 2021-11-12 NOTE — Progress Notes (Signed)
Prescription of brillinta sent

## 2021-12-18 ENCOUNTER — Encounter (HOSPITAL_BASED_OUTPATIENT_CLINIC_OR_DEPARTMENT_OTHER): Payer: Self-pay | Admitting: Emergency Medicine

## 2021-12-18 ENCOUNTER — Other Ambulatory Visit: Payer: Self-pay

## 2021-12-18 ENCOUNTER — Emergency Department (HOSPITAL_BASED_OUTPATIENT_CLINIC_OR_DEPARTMENT_OTHER): Payer: Medicare Other

## 2021-12-18 ENCOUNTER — Emergency Department (HOSPITAL_COMMUNITY)
Admission: EM | Admit: 2021-12-18 | Discharge: 2021-12-18 | Discharge status: Against Medical Advice | Payer: Medicare Other | Attending: Emergency Medicine | Admitting: Emergency Medicine

## 2021-12-18 ENCOUNTER — Inpatient Hospital Stay (HOSPITAL_BASED_OUTPATIENT_CLINIC_OR_DEPARTMENT_OTHER)
Admission: EM | Admit: 2021-12-18 | Discharge: 2021-12-23 | DRG: 065 | Disposition: A | Discharge status: Rehabilitation Facility | Payer: Medicare Other | Attending: Family Medicine | Admitting: Family Medicine

## 2021-12-18 ENCOUNTER — Encounter (HOSPITAL_COMMUNITY): Payer: Self-pay

## 2021-12-18 DIAGNOSIS — E1122 Type 2 diabetes mellitus with diabetic chronic kidney disease: Secondary | ICD-10-CM | POA: Diagnosis present

## 2021-12-18 DIAGNOSIS — D631 Anemia in chronic kidney disease: Secondary | ICD-10-CM | POA: Diagnosis present

## 2021-12-18 DIAGNOSIS — R42 Dizziness and giddiness: Secondary | ICD-10-CM | POA: Diagnosis not present

## 2021-12-18 DIAGNOSIS — I6381 Other cerebral infarction due to occlusion or stenosis of small artery: Secondary | ICD-10-CM | POA: Diagnosis not present

## 2021-12-18 DIAGNOSIS — N184 Chronic kidney disease, stage 4 (severe): Secondary | ICD-10-CM

## 2021-12-18 DIAGNOSIS — G43909 Migraine, unspecified, not intractable, without status migrainosus: Secondary | ICD-10-CM | POA: Diagnosis present

## 2021-12-18 DIAGNOSIS — R531 Weakness: Secondary | ICD-10-CM

## 2021-12-18 DIAGNOSIS — R29704 NIHSS score 4: Secondary | ICD-10-CM | POA: Diagnosis present

## 2021-12-18 DIAGNOSIS — Z8249 Family history of ischemic heart disease and other diseases of the circulatory system: Secondary | ICD-10-CM

## 2021-12-18 DIAGNOSIS — Z5321 Procedure and treatment not carried out due to patient leaving prior to being seen by health care provider: Secondary | ICD-10-CM | POA: Diagnosis not present

## 2021-12-18 DIAGNOSIS — H538 Other visual disturbances: Secondary | ICD-10-CM | POA: Diagnosis not present

## 2021-12-18 DIAGNOSIS — R519 Headache, unspecified: Secondary | ICD-10-CM | POA: Insufficient documentation

## 2021-12-18 DIAGNOSIS — I639 Cerebral infarction, unspecified: Secondary | ICD-10-CM | POA: Diagnosis present

## 2021-12-18 DIAGNOSIS — Z87442 Personal history of urinary calculi: Secondary | ICD-10-CM

## 2021-12-18 DIAGNOSIS — R339 Retention of urine, unspecified: Secondary | ICD-10-CM | POA: Diagnosis not present

## 2021-12-18 DIAGNOSIS — F411 Generalized anxiety disorder: Secondary | ICD-10-CM | POA: Diagnosis present

## 2021-12-18 DIAGNOSIS — I129 Hypertensive chronic kidney disease with stage 1 through stage 4 chronic kidney disease, or unspecified chronic kidney disease: Secondary | ICD-10-CM | POA: Diagnosis present

## 2021-12-18 DIAGNOSIS — H539 Unspecified visual disturbance: Secondary | ICD-10-CM

## 2021-12-18 DIAGNOSIS — I6930 Unspecified sequelae of cerebral infarction: Secondary | ICD-10-CM

## 2021-12-18 DIAGNOSIS — E1140 Type 2 diabetes mellitus with diabetic neuropathy, unspecified: Secondary | ICD-10-CM | POA: Diagnosis present

## 2021-12-18 DIAGNOSIS — E1151 Type 2 diabetes mellitus with diabetic peripheral angiopathy without gangrene: Secondary | ICD-10-CM | POA: Diagnosis present

## 2021-12-18 DIAGNOSIS — Z7902 Long term (current) use of antithrombotics/antiplatelets: Secondary | ICD-10-CM

## 2021-12-18 DIAGNOSIS — Z7982 Long term (current) use of aspirin: Secondary | ICD-10-CM

## 2021-12-18 DIAGNOSIS — Z87898 Personal history of other specified conditions: Secondary | ICD-10-CM

## 2021-12-18 DIAGNOSIS — E1169 Type 2 diabetes mellitus with other specified complication: Secondary | ICD-10-CM | POA: Diagnosis present

## 2021-12-18 DIAGNOSIS — K219 Gastro-esophageal reflux disease without esophagitis: Secondary | ICD-10-CM | POA: Diagnosis present

## 2021-12-18 DIAGNOSIS — Z803 Family history of malignant neoplasm of breast: Secondary | ICD-10-CM

## 2021-12-18 DIAGNOSIS — Y900 Blood alcohol level of less than 20 mg/100 ml: Secondary | ICD-10-CM | POA: Insufficient documentation

## 2021-12-18 DIAGNOSIS — M542 Cervicalgia: Secondary | ICD-10-CM | POA: Diagnosis not present

## 2021-12-18 DIAGNOSIS — Z833 Family history of diabetes mellitus: Secondary | ICD-10-CM

## 2021-12-18 DIAGNOSIS — E876 Hypokalemia: Secondary | ICD-10-CM | POA: Diagnosis present

## 2021-12-18 DIAGNOSIS — G8194 Hemiplegia, unspecified affecting left nondominant side: Secondary | ICD-10-CM | POA: Diagnosis present

## 2021-12-18 DIAGNOSIS — G43009 Migraine without aura, not intractable, without status migrainosus: Secondary | ICD-10-CM | POA: Diagnosis present

## 2021-12-18 DIAGNOSIS — R2990 Unspecified symptoms and signs involving the nervous system: Principal | ICD-10-CM

## 2021-12-18 DIAGNOSIS — Z79899 Other long term (current) drug therapy: Secondary | ICD-10-CM

## 2021-12-18 DIAGNOSIS — H5509 Other forms of nystagmus: Secondary | ICD-10-CM | POA: Diagnosis present

## 2021-12-18 DIAGNOSIS — N179 Acute kidney failure, unspecified: Secondary | ICD-10-CM

## 2021-12-18 DIAGNOSIS — I10 Essential (primary) hypertension: Secondary | ICD-10-CM | POA: Diagnosis present

## 2021-12-18 DIAGNOSIS — E870 Hyperosmolality and hypernatremia: Secondary | ICD-10-CM

## 2021-12-18 DIAGNOSIS — D638 Anemia in other chronic diseases classified elsewhere: Secondary | ICD-10-CM | POA: Diagnosis present

## 2021-12-18 DIAGNOSIS — H532 Diplopia: Secondary | ICD-10-CM | POA: Diagnosis present

## 2021-12-18 DIAGNOSIS — F32A Depression, unspecified: Secondary | ICD-10-CM | POA: Diagnosis present

## 2021-12-18 DIAGNOSIS — E1159 Type 2 diabetes mellitus with other circulatory complications: Secondary | ICD-10-CM | POA: Diagnosis present

## 2021-12-18 DIAGNOSIS — F419 Anxiety disorder, unspecified: Secondary | ICD-10-CM | POA: Diagnosis present

## 2021-12-18 DIAGNOSIS — J4520 Mild intermittent asthma, uncomplicated: Secondary | ICD-10-CM | POA: Diagnosis present

## 2021-12-18 DIAGNOSIS — E785 Hyperlipidemia, unspecified: Secondary | ICD-10-CM | POA: Diagnosis present

## 2021-12-18 DIAGNOSIS — H5120 Internuclear ophthalmoplegia, unspecified eye: Secondary | ICD-10-CM | POA: Diagnosis present

## 2021-12-18 DIAGNOSIS — I69398 Other sequelae of cerebral infarction: Secondary | ICD-10-CM

## 2021-12-18 LAB — DIFFERENTIAL
Abs Immature Granulocytes: 0.01 10*3/uL (ref 0.00–0.07)
Basophils Absolute: 0 10*3/uL (ref 0.0–0.1)
Basophils Relative: 1 %
Eosinophils Absolute: 0.1 10*3/uL (ref 0.0–0.5)
Eosinophils Relative: 2 %
Immature Granulocytes: 0 %
Lymphocytes Relative: 41 %
Lymphs Abs: 2.2 10*3/uL (ref 0.7–4.0)
Monocytes Absolute: 0.3 10*3/uL (ref 0.1–1.0)
Monocytes Relative: 5 %
Neutro Abs: 2.7 10*3/uL (ref 1.7–7.7)
Neutrophils Relative %: 51 %

## 2021-12-18 LAB — I-STAT CHEM 8, ED
BUN: 44 mg/dL — ABNORMAL HIGH (ref 6–20)
Calcium, Ion: 1.15 mmol/L (ref 1.15–1.40)
Chloride: 111 mmol/L (ref 98–111)
Creatinine, Ser: 3.2 mg/dL — ABNORMAL HIGH (ref 0.44–1.00)
Glucose, Bld: 117 mg/dL — ABNORMAL HIGH (ref 70–99)
HCT: 28 % — ABNORMAL LOW (ref 36.0–46.0)
Hemoglobin: 9.5 g/dL — ABNORMAL LOW (ref 12.0–15.0)
Potassium: 3.5 mmol/L (ref 3.5–5.1)
Sodium: 146 mmol/L — ABNORMAL HIGH (ref 135–145)
TCO2: 23 mmol/L (ref 22–32)

## 2021-12-18 LAB — ETHANOL: Alcohol, Ethyl (B): <10 mg/dL (ref <10)

## 2021-12-18 LAB — I-STAT BETA HCG BLOOD, ED (MC, WL, AP ONLY): I-stat hCG, quantitative: <5 m[IU]/mL (ref <5)

## 2021-12-18 LAB — COMPREHENSIVE METABOLIC PANEL
ALT: 15 U/L (ref 0–44)
AST: 15 U/L (ref 15–41)
Albumin: 3.6 g/dL (ref 3.5–5.0)
Alkaline Phosphatase: 46 U/L (ref 38–126)
Anion gap: 10 (ref 5–15)
BUN: 41 mg/dL — ABNORMAL HIGH (ref 6–20)
CO2: 23 mmol/L (ref 22–32)
Calcium: 9.2 mg/dL (ref 8.9–10.3)
Chloride: 111 mmol/L (ref 98–111)
Creatinine, Ser: 3.12 mg/dL — ABNORMAL HIGH (ref 0.44–1.00)
GFR, Estimated: 18 mL/min — ABNORMAL LOW (ref >60)
Glucose, Bld: 117 mg/dL — ABNORMAL HIGH (ref 70–99)
Potassium: 3.4 mmol/L — ABNORMAL LOW (ref 3.5–5.1)
Sodium: 144 mmol/L (ref 135–145)
Total Bilirubin: 0.5 mg/dL (ref 0.3–1.2)
Total Protein: 6.8 g/dL (ref 6.5–8.1)

## 2021-12-18 LAB — CBC
HCT: 28.1 % — ABNORMAL LOW (ref 36.0–46.0)
Hemoglobin: 9.1 g/dL — ABNORMAL LOW (ref 12.0–15.0)
MCH: 29.2 pg (ref 26.0–34.0)
MCHC: 32.4 g/dL (ref 30.0–36.0)
MCV: 90.1 fL (ref 80.0–100.0)
Platelets: 205 10*3/uL (ref 150–400)
RBC: 3.12 MIL/uL — ABNORMAL LOW (ref 3.87–5.11)
RDW: 13.7 % (ref 11.5–15.5)
WBC: 5.4 10*3/uL (ref 4.0–10.5)
nRBC: 0 % (ref 0.0–0.2)

## 2021-12-18 LAB — APTT: aPTT: 27 seconds (ref 24–36)

## 2021-12-18 LAB — PROTIME-INR
INR: 1.1 (ref 0.8–1.2)
Prothrombin Time: 14 seconds (ref 11.4–15.2)

## 2021-12-18 LAB — CBG MONITORING, ED: Glucose-Capillary: 105 mg/dL — ABNORMAL HIGH (ref 70–99)

## 2021-12-18 MED ORDER — CARVEDILOL 12.5 MG PO TABS
12.5000 mg | ORAL_TABLET | Freq: Two times a day (BID) | ORAL | Status: DC
  Administered 2021-12-19 – 2021-12-23 (×9): 12.5 mg via ORAL
  Filled 2021-12-18 (×9): qty 1

## 2021-12-18 MED ORDER — LORAZEPAM 1 MG PO TABS: 1.0000 mg | ORAL_TABLET | Freq: Once | ORAL | Status: DC | PRN

## 2021-12-18 MED ORDER — AMLODIPINE BESYLATE 5 MG PO TABS
5.0000 mg | ORAL_TABLET | Freq: Every day | ORAL | Status: DC
  Administered 2021-12-19 – 2021-12-23 (×5): 5 mg via ORAL
  Filled 2021-12-18 (×5): qty 1

## 2021-12-18 MED ORDER — TRAZODONE HCL 50 MG PO TABS
50.0000 mg | ORAL_TABLET | Freq: Every evening | ORAL | Status: DC | PRN
Start: 2021-12-18 — End: 2021-12-23
  Administered 2021-12-19 – 2021-12-22 (×3): 50 mg via ORAL
  Filled 2021-12-18 (×3): qty 1

## 2021-12-18 MED ORDER — FUROSEMIDE 40 MG PO TABS
40.0000 mg | ORAL_TABLET | Freq: Every day | ORAL | Status: DC
  Administered 2021-12-19 – 2021-12-22 (×4): 40 mg via ORAL
  Filled 2021-12-18 (×4): qty 1

## 2021-12-18 MED ORDER — LEVETIRACETAM 500 MG PO TABS
500.0000 mg | ORAL_TABLET | Freq: Two times a day (BID) | ORAL | Status: DC
  Filled 2021-12-18: qty 1

## 2021-12-18 MED ORDER — ATORVASTATIN CALCIUM 80 MG PO TABS
80.0000 mg | ORAL_TABLET | Freq: Every day | ORAL | Status: DC
  Administered 2021-12-18 – 2021-12-22 (×5): 80 mg via ORAL
  Filled 2021-12-18 (×3): qty 1
  Filled 2021-12-18: qty 2
  Filled 2021-12-18: qty 1

## 2021-12-18 NOTE — ED Triage Notes (Signed)
Patient awoke yesterday am with left arm weakness, now has left leg weakness with dizziness, headache. Patient concerned that she may be having another stroke. Alert and oriented

## 2021-12-18 NOTE — ED Provider Triage Note (Signed)
Emergency Medicine Provider Triage Evaluation Note  Christina Orozco , a 51 y.o. female  was evaluated in triage.  Pt complains of she reports she has a history of previous strokes.  She states she had a recent stroke.  Patient reports that yesterday she woke up with left arm weakness than left leg weakness progressively worsening.  She woke up today with a bad posterior headache, neck pain, dizziness, blurred vision and is unable to walk.  She presented for evaluation of what she thinks is a another stroke.  Review of Systems  Positive: Left-sided weakness Negative: Fever  Physical Exam  BP (!) 156/86   Pulse 85   Temp 98.2 F (36.8 C) (Oral)   Resp 14   LMP 05/22/2018 (Within Days)   SpO2 97%  Gen:   Awake, no distress   Resp:  Normal effort  MSK:   Moves extremities without difficulty  Other:  Weakness on the left  Medical Decision Making  Medically screening exam initiated at 6:23 PM.  Appropriate orders placed.  [name] was informed that the remainder of the evaluation will be completed by another provider, this initial triage assessment does not replace that evaluation, and the importance of remaining in the ED until their evaluation is complete.  Patient is not in code stroke window.  Work-up initiated   [name], PA-C 12/18/21 1824

## 2021-12-18 NOTE — ED Provider Notes (Signed)
[facility] DEPARTMENT Provider Note   CSN: [id number] Arrival date & time: 12/18/21  2057     History  No chief complaint on file.   Christina Orozco is a 51 y.o. female.PMH migraines, strokes, seizures, anxiety, CKD stage 4, DM, HTN, PVD presenting to the emergency room due to concern for weakness.  Patient states weakness started upon awakening Saturday morning.  Last known well Friday evening.  Weakness has been relatively constant.  States that she is unable to walk due to the weakness.  She denies any blurriness in her vision.  She does report that she has baseline blurriness in her right eye but this is unchanged today.  She denies numbness in her arms or legs.  No change in her speech.  Patient initially went to [facility], seen by PA in triage who spoke with neurology, outside stroke alert window, MRI brain, MRA head and neck ordered.  Patient left waiting room at [facility] before this was completed.  HPI     Home Medications Prior to Admission medications   Medication Sig Start Date End Date Taking? Authorizing Provider  albuterol (PROAIR HFA) 108 (90 Base) MCG/ACT inhaler INHALE 1 OR 2 PUFFS INTO THE LUNGS EVERY 6 HOURS AS NEEDED FOR WHEEZING OR SHORTNESS OF BREATH Patient taking differently: Inhale 1-2 puffs into the lungs every 6 (six) hours as needed for shortness of breath or wheezing. 04/12/18   Angiulli, Lavon Paganini, PA-C  amLODipine (NORVASC) 5 MG tablet Take 1 tablet (5 mg total) by mouth daily. 09/26/21   Skeet Latch, MD  aspirin 81 MG EC tablet Take 81 mg by mouth daily.    [provider]  atorvastatin (LIPITOR) 80 MG tablet Take 80 mg by mouth at bedtime. 02/25/20   [provider]  carboxymethylcellulose (REFRESH PLUS) 0.5 % SOLN Place 1 drop into both eyes daily as needed (dry eyes).    [provider]  carvedilol (COREG) 12.5 MG tablet Take 1 tablet (12.5 mg total) by mouth 2 (two) times daily with a meal. 10/26/21    Skeet Latch, MD  Cholecalciferol (VITAMIN D3) 50 MCG (2000 UT) capsule Take 1 capsule (2,000 Units total) by mouth daily. 11/11/21   Pokhrel, Corrie Mckusick, MD  diclofenac Sodium (VOLTAREN) 1 % GEL Apply 2 g topically daily as needed (pain).    [provider]  DULoxetine (CYMBALTA) 60 MG capsule Take 60 mg by mouth at bedtime.    [provider]  furosemide (LASIX) 20 MG tablet TAKE ALTERNATING 1 TABLET AND 2 TABLETS BY MOUTH DAILY Patient taking differently: Take 40 mg by mouth daily. 10/27/21   Loel Dubonnet, NP  glucose blood (CONTOUR NEXT TEST) test strip 1 each by Other route 2 (two) times daily. And lancets 2/day Patient taking differently: 1 each by Other route 2 (two) times daily. 04/12/18   Angiulli, Lavon Paganini, PA-C  hydrALAZINE (APRESOLINE) 50 MG tablet Take 1 tablet (50 mg total) by mouth in the morning and at bedtime. 09/26/21   Skeet Latch, MD  levETIRAcetam (KEPPRA) 500 MG tablet TAKE 1 TABLET(500 MG) BY MOUTH TWICE DAILY Patient taking differently: Take 500 mg by mouth 2 (two) times daily. TAKE 1 TABLET(500 MG) BY MOUTH TWICE DAILY 07/15/18   Lanae Boast, FNP  meclizine (ANTIVERT) 25 MG tablet Take 1 tablet (25 mg total) by mouth 3 (three) times daily as needed for dizziness. 01/16/21   Veryl Speak, MD  pantoprazole (PROTONIX) 40 MG tablet TAKE 1 TABLET(40 MG) BY  MOUTH DAILY Patient taking differently: Take 40 mg by mouth daily. 04/23/19   Tresa Garter, MD  Semaglutide,0.25 or 0.5MG /DOS, (OZEMPIC, 0.25 OR 0.5 MG/DOSE,) 2 MG/1.5ML SOPN Inject 0.5 mg into the skin See admin instructions. Taking every 10 days 02/27/20   [provider]  traZODone (DESYREL) 50 MG tablet Take 50 mg by mouth at bedtime as needed for sleep. 07/05/21   [provider]      Allergies    Lisinopril, Penicillins, and Gabapentin    Review of Systems   Review of Systems  Constitutional:  Negative for chills and fever.  HENT:  Negative for ear pain and sore  throat.   Eyes:  Negative for pain and visual disturbance.  Respiratory:  Negative for cough and shortness of breath.   Cardiovascular:  Negative for chest pain and palpitations.  Gastrointestinal:  Negative for abdominal pain and vomiting.  Genitourinary:  Negative for dysuria and hematuria.  Musculoskeletal:  Negative for arthralgias and back pain.  Skin:  Negative for color change and rash.  Neurological:  Positive for weakness. Negative for seizures and syncope.  All other systems reviewed and are negative.   Physical Exam Updated Vital Signs BP (!) 157/105   Pulse 79   Temp 97.8 F (36.6 C) (Oral)   Resp (!) 22   Ht 5\' 2"  (1.575 m)   Wt 68.5 kg   LMP 05/22/2018 (Within Days)   SpO2 100%   BMI 27.62 kg/m  Physical Exam Vitals and nursing note reviewed.  Constitutional:      General: She is not in acute distress.    Appearance: She is well-developed.  HENT:     Head: Normocephalic and atraumatic.  Eyes:     Conjunctiva/sclera: Conjunctivae normal.  Cardiovascular:     Rate and Rhythm: Normal rate and regular rhythm.     Heart sounds: No murmur heard. Pulmonary:     Effort: Pulmonary effort is normal. No respiratory distress.     Breath sounds: Normal breath sounds.  Abdominal:     Palpations: Abdomen is soft.     Tenderness: There is no abdominal tenderness.  Musculoskeletal:        General: No swelling.     Cervical back: Neck supple.  Skin:    General: Skin is warm and dry.     Capillary Refill: Capillary refill takes less than 2 seconds.  Neurological:     Mental Status: She is alert.     Comments: AAOx3 CN 2-12 grossly intact, visual fields on left eye are normal, on right eye patient states she has baseline blurry vision 2/5 strength in LUE and LLE 5/5 strength in RUE, RLE Sensation to light touch intact in b/l UE and LE Normal FNF  Psychiatric:        Mood and Affect: Mood normal.     ED Results / Procedures / Treatments   Labs (all labs  ordered are listed, but only abnormal results are displayed) Labs Reviewed  CBG MONITORING, ED - Abnormal; Notable for the following components:      Result Value   Glucose-Capillary 105 (*)    All other components within normal limits    EKG EKG Interpretation  Date/Time:  Sunday December 18 2021 21:15:44 EDT Ventricular Rate:  78 PR Interval:  145 QRS Duration: 81 QT Interval:  399 QTC Calculation: 455 R Axis:   69 Text Interpretation: Sinus rhythm Low voltage, precordial leads Confirmed by Madalyn Rob [phone]) on 12/18/2021 10:31:21 PM  Radiology CT Head Wo Contrast  Result Date: 12/18/2021 CLINICAL DATA:  Left arm weakness for 2 days, initial encounter EXAM: CT HEAD WITHOUT CONTRAST TECHNIQUE: Contiguous axial images were obtained from the base of the skull through the vertex without intravenous contrast. RADIATION DOSE REDUCTION: This exam was performed according to the departmental dose-optimization program which includes automated exposure control, adjustment of the mA and/or kV according to patient size and/or use of iterative reconstruction technique. COMPARISON:  11/10/2021 FINDINGS: Brain: Scattered hypodensities are noted within the pons and thalami consistent with prior lacunar infarcts stable from prior MRI. No acute hemorrhage or acute infarction is seen. No space occupying mass lesion is seen. Scattered chronic white matter ischemic changes are also seen. Vascular: No hyperdense vessel or unexpected calcification. Skull: Normal. Negative for fracture or focal lesion. Sinuses/Orbits: No acute finding. Other: None. IMPRESSION: Chronic ischemic changes without acute abnormality. Electronically Signed   By: Inez Catalina M.D.   On: 12/18/2021 21:33    Procedures .Critical Care  Performed by: Lucrezia Starch, MD Authorized by: Lucrezia Starch, MD   Critical care provider statement:    Critical care time (minutes):  39   Critical care was necessary to treat or prevent  imminent or life-threatening deterioration of the following conditions:  CNS failure or compromise   Critical care was time spent personally by me on the following activities:  Development of treatment plan with patient or surrogate, discussions with consultants, evaluation of patient's response to treatment, examination of patient, ordering and review of laboratory studies, ordering and review of radiographic studies, ordering and performing treatments and interventions, pulse oximetry, re-evaluation of patient's condition and review of old charts     Medications Ordered in ED Medications - No data to display  ED Course/ Medical Decision Making/ A&P                           Medical Decision Making Amount and/or Complexity of Data Reviewed External Data Reviewed: labs, radiology and notes. Labs: ordered. Radiology: ordered and independent interpretation performed. ECG/medicine tests: ordered and independent interpretation performed. Discussion of management or test interpretation with external provider(s): Neurology, kirkpatrick, hospitalist Rathore  Risk Decision regarding hospitalization.  Critical Care Total time providing critical care: 6 minutes   51 year old lady presented to ER due to concern for left-sided weakness.  She does have prior history of stroke among other medical problems.  On physical exam she does have weakness in her left arm and left leg though exam is somewhat inconsistent.  Her last known well is Friday evening when going to bed.  Outside any sort of stroke alert window.  Seen earlier at [facility] and plan was to get MRI imaging however patient left before this was completed and came to [facility] for evaluation.  CT scan of her head was obtained here and I independently reviewed and interpreted results, no obvious findings to suggest stroke or mass or bleeding.  No acute findings per radiologist.  I discussed the case with Dr. Leonel Ramsay - he advises  admitting for stroke work up including MRI brain, MRA head/neck. Pt has baseline poor kidney function precluding CTA. I discussed with Rathore who will accept to [facility] to [facility].       Final Clinical Impression(s) / ED Diagnoses Final diagnoses:  Stroke-like symptoms  Left-sided weakness    Rx / DC Orders ED Discharge Orders     None  Lucrezia Starch, MD 12/18/21 2232

## 2021-12-18 NOTE — Plan of Care (Signed)
[facility] will assume care on arrival to accepting facility. Until arrival, care as per EDP. However, [facility] available 24/7 for questions and assistance.  Nursing staff, please page [facility] Admits and Consults ([phone]) as soon as the patient arrives to the hospital.   

## 2021-12-18 NOTE — ED Notes (Signed)
X2 for vitals recheck with no response °

## 2021-12-18 NOTE — ED Triage Notes (Signed)
Reports she was at [facility] for three hours waiting so came here.  Reports she has had left arm weakness since Saturday but woke up today and can't move the left arm or leg at all.  Daughter also reports her eyes have been moving strangely.

## 2021-12-19 ENCOUNTER — Observation Stay (HOSPITAL_COMMUNITY): Payer: Medicare Other

## 2021-12-19 ENCOUNTER — Encounter (HOSPITAL_BASED_OUTPATIENT_CLINIC_OR_DEPARTMENT_OTHER): Payer: Self-pay | Admitting: Internal Medicine

## 2021-12-19 DIAGNOSIS — H539 Unspecified visual disturbance: Secondary | ICD-10-CM

## 2021-12-19 DIAGNOSIS — E1122 Type 2 diabetes mellitus with diabetic chronic kidney disease: Secondary | ICD-10-CM | POA: Diagnosis not present

## 2021-12-19 DIAGNOSIS — R29704 NIHSS score 4: Secondary | ICD-10-CM | POA: Diagnosis not present

## 2021-12-19 DIAGNOSIS — E1151 Type 2 diabetes mellitus with diabetic peripheral angiopathy without gangrene: Secondary | ICD-10-CM | POA: Diagnosis not present

## 2021-12-19 DIAGNOSIS — I69398 Other sequelae of cerebral infarction: Secondary | ICD-10-CM | POA: Diagnosis not present

## 2021-12-19 DIAGNOSIS — Z87442 Personal history of urinary calculi: Secondary | ICD-10-CM | POA: Diagnosis not present

## 2021-12-19 DIAGNOSIS — Z7902 Long term (current) use of antithrombotics/antiplatelets: Secondary | ICD-10-CM | POA: Diagnosis not present

## 2021-12-19 DIAGNOSIS — F32A Depression, unspecified: Secondary | ICD-10-CM | POA: Diagnosis not present

## 2021-12-19 DIAGNOSIS — D631 Anemia in chronic kidney disease: Secondary | ICD-10-CM | POA: Diagnosis not present

## 2021-12-19 DIAGNOSIS — Z803 Family history of malignant neoplasm of breast: Secondary | ICD-10-CM | POA: Diagnosis not present

## 2021-12-19 DIAGNOSIS — I6381 Other cerebral infarction due to occlusion or stenosis of small artery: Secondary | ICD-10-CM | POA: Diagnosis not present

## 2021-12-19 DIAGNOSIS — Z833 Family history of diabetes mellitus: Secondary | ICD-10-CM | POA: Diagnosis not present

## 2021-12-19 DIAGNOSIS — N184 Chronic kidney disease, stage 4 (severe): Secondary | ICD-10-CM

## 2021-12-19 DIAGNOSIS — K219 Gastro-esophageal reflux disease without esophagitis: Secondary | ICD-10-CM | POA: Diagnosis not present

## 2021-12-19 DIAGNOSIS — I129 Hypertensive chronic kidney disease with stage 1 through stage 4 chronic kidney disease, or unspecified chronic kidney disease: Secondary | ICD-10-CM | POA: Diagnosis not present

## 2021-12-19 DIAGNOSIS — R531 Weakness: Secondary | ICD-10-CM | POA: Diagnosis present

## 2021-12-19 DIAGNOSIS — F419 Anxiety disorder, unspecified: Secondary | ICD-10-CM | POA: Diagnosis not present

## 2021-12-19 DIAGNOSIS — H5120 Internuclear ophthalmoplegia, unspecified eye: Secondary | ICD-10-CM | POA: Diagnosis not present

## 2021-12-19 DIAGNOSIS — E870 Hyperosmolality and hypernatremia: Secondary | ICD-10-CM | POA: Diagnosis not present

## 2021-12-19 DIAGNOSIS — E1140 Type 2 diabetes mellitus with diabetic neuropathy, unspecified: Secondary | ICD-10-CM | POA: Diagnosis not present

## 2021-12-19 DIAGNOSIS — Z7982 Long term (current) use of aspirin: Secondary | ICD-10-CM | POA: Diagnosis not present

## 2021-12-19 DIAGNOSIS — N179 Acute kidney failure, unspecified: Secondary | ICD-10-CM | POA: Diagnosis not present

## 2021-12-19 DIAGNOSIS — G8194 Hemiplegia, unspecified affecting left nondominant side: Secondary | ICD-10-CM | POA: Diagnosis not present

## 2021-12-19 DIAGNOSIS — Z8249 Family history of ischemic heart disease and other diseases of the circulatory system: Secondary | ICD-10-CM | POA: Diagnosis not present

## 2021-12-19 DIAGNOSIS — J4520 Mild intermittent asthma, uncomplicated: Secondary | ICD-10-CM | POA: Diagnosis not present

## 2021-12-19 DIAGNOSIS — E785 Hyperlipidemia, unspecified: Secondary | ICD-10-CM | POA: Diagnosis not present

## 2021-12-19 LAB — MAGNESIUM: Magnesium: 2.1 mg/dL (ref 1.7–2.4)

## 2021-12-19 MED ORDER — MONTELUKAST SODIUM 10 MG PO TABS
10.0000 mg | ORAL_TABLET | Freq: Every day | ORAL | Status: DC
  Administered 2021-12-19 – 2021-12-22 (×4): 10 mg via ORAL
  Filled 2021-12-19 (×4): qty 1

## 2021-12-19 MED ORDER — SENNOSIDES-DOCUSATE SODIUM 8.6-50 MG PO TABS
1.0000 | ORAL_TABLET | Freq: Every evening | ORAL | Status: DC | PRN
  Administered 2021-12-20: 1 via ORAL
  Filled 2021-12-19: qty 1

## 2021-12-19 MED ORDER — ASPIRIN 81 MG PO TBEC
81.0000 mg | DELAYED_RELEASE_TABLET | Freq: Every day | ORAL | Status: DC
  Administered 2021-12-19 – 2021-12-23 (×5): 81 mg via ORAL
  Filled 2021-12-19 (×5): qty 1

## 2021-12-19 MED ORDER — POTASSIUM CHLORIDE CRYS ER 10 MEQ PO TBCR
10.0000 meq | EXTENDED_RELEASE_TABLET | Freq: Once | ORAL | Status: AC
  Administered 2021-12-19: 10 meq via ORAL
  Filled 2021-12-19: qty 1

## 2021-12-19 MED ORDER — DULOXETINE HCL 60 MG PO CPEP
60.0000 mg | ORAL_CAPSULE | Freq: Every day | ORAL | Status: DC
  Administered 2021-12-19 – 2021-12-22 (×4): 60 mg via ORAL
  Filled 2021-12-19 (×4): qty 1

## 2021-12-19 MED ORDER — ACETAMINOPHEN 325 MG PO TABS
650.0000 mg | ORAL_TABLET | ORAL | Status: DC | PRN
  Administered 2021-12-19: 650 mg via ORAL
  Filled 2021-12-19 (×2): qty 2

## 2021-12-19 MED ORDER — PANTOPRAZOLE SODIUM 40 MG PO TBEC
40.0000 mg | DELAYED_RELEASE_TABLET | Freq: Every day | ORAL | Status: DC
  Administered 2021-12-19 – 2021-12-23 (×5): 40 mg via ORAL
  Filled 2021-12-19 (×5): qty 1

## 2021-12-19 MED ORDER — TICAGRELOR 60 MG PO TABS
60.0000 mg | ORAL_TABLET | Freq: Two times a day (BID) | ORAL | Status: DC
  Administered 2021-12-20 – 2021-12-23 (×7): 60 mg via ORAL
  Filled 2021-12-19 (×9): qty 1

## 2021-12-19 MED ORDER — STROKE: EARLY STAGES OF RECOVERY BOOK
Freq: Once | Status: AC
  Filled 2021-12-19: qty 1

## 2021-12-19 MED ORDER — FLUTICASONE PROPIONATE 50 MCG/ACT NA SUSP
2.0000 | Freq: Every day | NASAL | Status: DC
  Administered 2021-12-20 – 2021-12-23 (×4): 2 via NASAL
  Filled 2021-12-19: qty 16

## 2021-12-19 MED ORDER — POLYVINYL ALCOHOL 1.4 % OP SOLN: 1.0000 [drp] | Freq: Every day | OPHTHALMIC | Status: DC | PRN

## 2021-12-19 MED ORDER — CARBOXYMETHYLCELLULOSE SODIUM 0.5 % OP SOLN: 1.0000 [drp] | Freq: Every day | OPHTHALMIC | Status: DC | PRN

## 2021-12-19 MED ORDER — ALBUTEROL SULFATE (2.5 MG/3ML) 0.083% IN NEBU: 2.5000 mg | INHALATION_SOLUTION | Freq: Four times a day (QID) | RESPIRATORY_TRACT | Status: DC | PRN

## 2021-12-19 NOTE — H&P (Signed)
History and Physical    Patient: Christina Orozco QMV:[id number] DOB: 1970/07/14 DOA: 12/18/2021 DOS: the patient was seen and examined on 12/19/2021 PCP: Jenel Lucks, PA-C  Patient coming from:  [facility]  - lives with her daughter. Uses cane at baseline.    Chief Complaint: left sided weakness and visual deficits   HPI: Christina Orozco is a 51 y.o. female with medical history significant of CKD stage IV, asthma, anxiety and depression, T2DM, HTN, GERD, PVD, hx of CVA on DAPT who presented to [facility] ED on 7/9 with complaints of left sided weakness and vision changes. She states when she woke up on Saturday Ams she had weakness in her left arm. She had no tingling or numbness, but did have decreased grip strength. On Sunday AM she had left leg weakness and was dragging her leg. She also had vision changes and couldn't see. She feels like she is having depth perception problems and gets dizzy when she looks to the left. Everything was distorted with some double vision. She has a picture where her left eye is turned outwards. She knew something wasn't right so came to ED. She came to ER and was put in waiting room, but didn't want to stay after waiting 3 hours so left and went to [facility].  Recent hospitalization in June 2023 for possible CVA.   MRI showed possible small right cerebral peduncle infarcts due to small vessel disease. She was discharged with instructions to take Brilinta 90mg  BID for 30 days and then resume ASA and Plavix. She followed up with [facility] neurology outpatient and they switched her from Plavix to Brilinta 60mg  BID and ASA 81mg . Her neurologist does have her on aimovig for migraines as well. She has been compliant with her medications. At her appointment with [facility] Neurology it was noted that she did have an episode of confusion. Her neurologist ordered an EEG outpatient and was considering resuming her keppra if it was abnormal  Denies any fever/chills, + headaches, no  palpitations, shortness of breath or cough, + abdominal pain, N/V/D, dysuria or leg swelling. Complains of smelling cigarette smoke and burning.    She does not smoke or drink alcohol. History of multiple strokes and is on brillinta 60mg  and ASA.   ER Course:  vitals: afebrile, bP: 149/91, HR 82, RR: 18, oxygen: 99%on RA Pertinent labs: hgb: 9.1, potassium: 3.4, BUN: 41, creatinine: 3.12,  CT head: no acute finding  In ED: discussed with Dr. Leonel Ramsay from neurology who recommended getting brain MRI/MRA.      Review of Systems: As mentioned in the history of present illness. All other systems reviewed and are negative. Past Medical History:  Diagnosis Date   Anemia    severe   Anxiety    Asthma    CKD (chronic kidney disease)    stage IV   DDD (degenerative disc disease), cervical    Depression    Diabetes mellitus (HCC)    Dislocated shoulder    right   GERD (gastroesophageal reflux disease)    Headache    migraines (about once a month)   History of kidney stones    Hypertension    Neuropathy    Peripheral vascular disease ([place])    Pneumonia    Stroke [facility])    Past Surgical History:  Procedure Laterality Date   ADENOIDECTOMY     APPENDECTOMY     CERVICAL SPINE SURGERY  06/2012   C5-C6   EYE SURGERY     left eye  surgery    FOOT SURGERY     KENALOG INJECTION Left 03/10/2021   Procedure: Intravitreal Avastin Injection;  Surgeon: Sherlynn Stalls, MD;  Location: [place];  Service: Ophthalmology;  Laterality: Left;   LOOP RECORDER INSERTION N/A 08/27/2017   Procedure: LOOP RECORDER INSERTION;  Surgeon: Sanda Klein, MD;  Location: [place] CV LAB;  Service: Cardiovascular;  Laterality: N/A;   MEMBRANE PEEL Left 03/10/2021   Procedure: MEMBRANE PEEL;  Surgeon: Sherlynn Stalls, MD;  Location: [place];  Service: Ophthalmology;  Laterality: Left;   PARS PLANA VITRECTOMY Left 03/10/2021   Procedure: TWENTY-FIVE GAUGE PARS PLANA VITRECTOMY;  Surgeon: Sherlynn Stalls, MD;   Location: [place];  Service: Ophthalmology;  Laterality: Left;   PHOTOCOAGULATION WITH LASER Left 03/10/2021   Procedure: PHOTOCOAGULATION WITH LASER;  Surgeon: Sherlynn Stalls, MD;  Location: [place];  Service: Ophthalmology;  Laterality: Left;   SHOULDER SURGERY     TEE WITHOUT CARDIOVERSION N/A 08/27/2017   Procedure: TRANSESOPHAGEAL ECHOCARDIOGRAM (TEE);  Surgeon: Sanda Klein, MD;  Location: [facility] ENDOSCOPY;  Service: Cardiovascular;  Laterality: N/A;   TONSILLECTOMY     Social History:  reports that she has never smoked. She has never used smokeless tobacco. She reports that she does not drink alcohol and does not use drugs.  Allergies  Allergen Reactions   Lisinopril Swelling and Other (See Comments)    Facial swelling    Penicillins Hives    Has patient had a PCN reaction causing immediate rash, facial/tongue/throat swelling, SOB or lightheadedness with hypotension: yes Has patient had a PCN reaction causing severe rash involving mucus membranes or skin necrosis: No Has patient had a PCN reaction that required hospitalization No Has patient had a PCN reaction occurring within the last 10 years: No If all of the above answers are "NO", then may proceed wit   Gabapentin Swelling and Other (See Comments)    "I thought I was having another stroke."     Family History  Problem Relation Age of Onset   Vascular Disease Mother    CAD Mother    Heart failure Mother    Heart disease Other    Cancer Other        colon, stomach, pancreatic, lung   Diabetes Other    Breast cancer Sister 30   Seizures Maternal Uncle     Prior to Admission medications   Medication Sig Start Date End Date Taking? Authorizing Provider  albuterol (PROAIR HFA) 108 (90 Base) MCG/ACT inhaler INHALE 1 OR 2 PUFFS INTO THE LUNGS EVERY 6 HOURS AS NEEDED FOR WHEEZING OR SHORTNESS OF BREATH Patient taking differently: Inhale 1-2 puffs into the lungs every 6 (six) hours as needed for shortness of breath or wheezing.  04/12/18   Angiulli, Lavon Paganini, PA-C  amLODipine (NORVASC) 5 MG tablet Take 1 tablet (5 mg total) by mouth daily. 09/26/21   Skeet Latch, MD  aspirin 81 MG EC tablet Take 81 mg by mouth daily.    [provider]  atorvastatin (LIPITOR) 80 MG tablet Take 80 mg by mouth at bedtime. 02/25/20   [provider]  carboxymethylcellulose (REFRESH PLUS) 0.5 % SOLN Place 1 drop into both eyes daily as needed (dry eyes).    [provider]  carvedilol (COREG) 12.5 MG tablet Take 1 tablet (12.5 mg total) by mouth 2 (two) times daily with a meal. 10/26/21   Skeet Latch, MD  Cholecalciferol (VITAMIN D3) 50 MCG (2000 UT) capsule Take 1 capsule (2,000 Units total) by mouth daily.  11/11/21   Pokhrel, Corrie Mckusick, MD  diclofenac Sodium (VOLTAREN) 1 % GEL Apply 2 g topically daily as needed (pain).    [provider]  DULoxetine (CYMBALTA) 60 MG capsule Take 60 mg by mouth at bedtime.    [provider]  furosemide (LASIX) 20 MG tablet TAKE ALTERNATING 1 TABLET AND 2 TABLETS BY MOUTH DAILY Patient taking differently: Take 40 mg by mouth daily. 10/27/21   Loel Dubonnet, NP  glucose blood (CONTOUR NEXT TEST) test strip 1 each by Other route 2 (two) times daily. And lancets 2/day Patient taking differently: 1 each by Other route 2 (two) times daily. 04/12/18   Angiulli, Lavon Paganini, PA-C  hydrALAZINE (APRESOLINE) 50 MG tablet Take 1 tablet (50 mg total) by mouth in the morning and at bedtime. 09/26/21   Skeet Latch, MD  levETIRAcetam (KEPPRA) 500 MG tablet TAKE 1 TABLET(500 MG) BY MOUTH TWICE DAILY Patient taking differently: Take 500 mg by mouth 2 (two) times daily. TAKE 1 TABLET(500 MG) BY MOUTH TWICE DAILY 07/15/18   Lanae Boast, FNP  meclizine (ANTIVERT) 25 MG tablet Take 1 tablet (25 mg total) by mouth 3 (three) times daily as needed for dizziness. 01/16/21   Veryl Speak, MD  pantoprazole (PROTONIX) 40 MG tablet TAKE 1 TABLET(40 MG) BY MOUTH DAILY Patient taking  differently: Take 40 mg by mouth daily. 04/23/19   Tresa Garter, MD  Semaglutide,0.25 or 0.5MG /DOS, (OZEMPIC, 0.25 OR 0.5 MG/DOSE,) 2 MG/1.5ML SOPN Inject 0.5 mg into the skin See admin instructions. Taking every 10 days 02/27/20   [provider]  traZODone (DESYREL) 50 MG tablet Take 50 mg by mouth at bedtime as needed for sleep. 07/05/21   [provider]    Physical Exam: Vitals:   12/19/21 1545 12/19/21 1706 12/19/21 1708 12/19/21 1955  BP: 136/81 140/87  123/77  Pulse: 81 78  77  Resp: 19 20  18   Temp: 98.2 F (36.8 C)  98.3 F (36.8 C) 98.4 F (36.9 C)  TempSrc: Oral   Oral  SpO2: 100% 100%    Weight:      Height:       General:  Appears calm and comfortable and is in NAD Eyes:  PERRL, EOMI, normal lids, iris ENT:  grossly normal hearing, lips & tongue, mmm; appropriate dentition Neck:  no LAD, masses or thyromegaly;  no carotid bruits Cardiovascular:  RRR, no m/r/g. No LE edema.  Respiratory:   CTA bilaterally with no wheezes/rales/rhonchi.  Normal respiratory effort. Abdomen:  soft, NT, ND, NABS Back:   normal alignment, no CVAT Skin:  no rash or induration seen on limited exam Musculoskeletal:  grossly normal tone RUE/RLE. LUE:4/5, LLE: 2-3/5, good ROM, no bony abnormality Lower extremity:  No LE edema.  Limited foot exam with no ulcerations.  2+ distal pulses. Psychiatric:  grossly normal mood and affect, speech fluent and appropriate, AOx3 Neurologic:  CN  exam wnl except she can not adduct right eye. Can not cross midline and left eye with nystagmus and leftward gaze. grossly intact, moves all extremities in coordinated fashion, sensation intact. FTN intact, but slower on left. HTN intact on right, can not perform on left. Gait deferred.    Radiological Exams on Admission: Independently reviewed - see discussion in A/P where applicable  CT Head Wo Contrast  Result Date: 12/18/2021 CLINICAL DATA:  Left arm weakness for 2 days, initial  encounter EXAM: CT HEAD WITHOUT CONTRAST TECHNIQUE: Contiguous axial images were obtained from the base of  the skull through the vertex without intravenous contrast. RADIATION DOSE REDUCTION: This exam was performed according to the departmental dose-optimization program which includes automated exposure control, adjustment of the mA and/or kV according to patient size and/or use of iterative reconstruction technique. COMPARISON:  11/10/2021 FINDINGS: Brain: Scattered hypodensities are noted within the pons and thalami consistent with prior lacunar infarcts stable from prior MRI. No acute hemorrhage or acute infarction is seen. No space occupying mass lesion is seen. Scattered chronic white matter ischemic changes are also seen. Vascular: No hyperdense vessel or unexpected calcification. Skull: Normal. Negative for fracture or focal lesion. Sinuses/Orbits: No acute finding. Other: None. IMPRESSION: Chronic ischemic changes without acute abnormality. Electronically Signed   By: Inez Catalina M.D.   On: 12/18/2021 21:33    EKG: Independently reviewed.  NSR with rate 78; nonspecific ST changes with no evidence of acute ischemia   Labs on Admission: I have personally reviewed the available labs and imaging studies at the time of the admission.  Pertinent labs:   hgb: 9.1,  potassium: 3.4,  BUN: 41,  creatinine: 3.12,   Assessment and Plan: Principal Problem:   Left-sided weakness and visual deficits  Active Problems:   Hypokalemia   History of CVA with residual deficit   CKD (chronic kidney disease) stage 4, GFR 15-29 ml/min (HCC)   History of seizure   Hypertension   Hyperlipidemia   Asthma, mild intermittent   Anemia of chronic disease   Generalized anxiety disorder   Gastroesophageal reflux disease without esophagitis   Vision abnormalities    Assessment and Plan: * Left-sided weakness and visual deficits  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 who presented with 2-3  day history of left sided weakness and visual deficits -place in observation on telemetry for TIA/stroke work-up -Neurochecks per protocol -Neurology consulted -MRI/MRA brain without contrast  -fasting lipid panel. Recent A1C of 6.5  -Continue daily aspirin 81 mg and brilinta to start after MRI  -continue HTN control since outside of permissive HTN window -passed bedside swallow screen  -PT/ OT consult   Hypokalemia Check magnesium Will give 39meq x 1 Repeat tomorrow   History of CVA with residual deficit Continue DAPT Stroke w/u per above  CKD (chronic kidney disease) stage 4, GFR 15-29 ml/min (HCC) Creatinine baseline around 2.7-3.0, stable Continue to monitor  History of seizure Seizure precautions. keppra was stopped in 05/2022     Hypertension Outside of permissive HTN, continue with blood pressure control  Continue home medication of coreg 12.5mg  BID, norvasc 5mg  daily, hydralzine 50mg  BID and lasix 40mg  daily   Hyperlipidemia Repeat fasting lipid panel in AM Continue high dose statin, lipitor 80mg    Asthma, mild intermittent Stable, continue SABA prn   Anemia of chronic disease Baseline around 10, continue to monitor   Generalized anxiety disorder Continue cymbalta   Gastroesophageal reflux disease without esophagitis Continue protonix     Advance Care Planning:   Code Status: Full Code   Consults: neurology  DVT Prophylaxis: SCD  Family Communication: none   Severity of Illness: The appropriate patient status for this patient is OBSERVATION. Observation status is judged to be reasonable and necessary in order to provide the required intensity of service to ensure the patient's safety. The patient's presenting symptoms, physical exam findings, and initial radiographic and laboratory data in the context of their medical condition is felt to place them at decreased risk for further clinical deterioration. Furthermore, it is anticipated that the patient  will be medically stable for discharge from the hospital within 2 midnights of admission.   Author: Orma Flaming, MD 12/19/2021 8:15 PM  For on call review [email or web address].

## 2021-12-19 NOTE — Assessment & Plan Note (Signed)
Continue DAPT Stroke w/u per above

## 2021-12-19 NOTE — Consult Note (Signed)
Neurology Consultation  Reason for Consult: Left side weakness, vision changes, stroke workup Referring Physician: Rogers Blocker  CC: left side weakness, vision changes  History is obtained from:Patient  HPI: Christina Orozco is a 51 y.o. female hypertension, hyperlipidemia, diabetes, CKD 4, seizure now off Keppra, several previous lacunar infarcts admitted for left side weakness and vision changes that started on Saturday. Ms. Sturdivant reports that on Saturday she noticed left arm weakness. On Sunday she noticed this had progressed to include her left leg and then her daughter noticed that her eyes had been moving strangely.She does have some gait instability (using a cane) from her prior stroke, but for the last month has felt well.    On 11/10/2021 she presented with an acute onset of left facial, tongue tingling. MRI showed possible small right cerebral peduncle infarcts due to small vessel disease. She was discharged with instructions to take Brilinta 90mg  BID for 30 days and then resume ASA and Plavix. She followed up with [facility] neurology outpatient and they switched her from Plavix to Brilinta 60mg  BID and ASA 81mg . Her neurologist does have her on aimovig for migraines as well. She has been compliant with her medications. At her appointment with [facility] Neurology it was noted that she did have an episode of confusion. Her neurologist ordered an EEG outpatient and was considering resuming her keppra if it was abnormal  Hx of Stroke 07/2017- MRI - 26mm infarct within left paramedian pons, EF 65 to 70%, carotid Doppler and MRA negative.  LDL 43, A1c 12.5, patient put on DAPT and Lipitor 40. 02/27/2019- woke up with complete vision loss, gradually vision resumed OD, still has better vision OS.  MRI- 3 mm acute to early subacute infarct in the dorsal midline of the pons  05/01/2020- MRI showed new T2 hyperintense lesion of the right frontal white matter likely reflect an area of late subacute or chronic  infarction. 06/19/2020- Subcentimeter likely subacute infarct at the left lateral aspect of the pons. EF 70 to 75%.  LDL 63, A1c 6.2.  Put on aspirin and Brilinta for 1 month and then back to aspirin and the Plavix.  Continue Lipitor 80. 11/10/2021 - MRI showed possible small right cerebral peduncle infarcts. DAPT with ASA and brilinta - Loop recorder placed in 2019 and explanted in 2022 with no arrhythmia found.   LKW: Saturday morning  IV thrombolysis given?: no, outside of window Premorbid modified Rankin scale (mRS):  2-Slight disability-UNABLE to perform all activities but does not need assistance   ROS: Full ROS was performed and is negative except as noted in the HPI.  Past Medical History:  Diagnosis Date   Anemia    severe   Anxiety    Asthma    CKD (chronic kidney disease)    stage IV   DDD (degenerative disc disease), cervical    Depression    Diabetes mellitus (HCC)    Dislocated shoulder    right   GERD (gastroesophageal reflux disease)    Headache    migraines (about once a month)   History of kidney stones    Hypertension    Neuropathy    Peripheral vascular disease (HCC)    Pneumonia    Stroke ([place])    Family History  Problem Relation Age of Onset   Vascular Disease Mother    CAD Mother    Heart failure Mother    Heart disease Other    Cancer Other        colon, stomach, pancreatic,  lung   Diabetes Other    Breast cancer Sister 30   Seizures Maternal Uncle    Social History:   reports that she has never smoked. She has never used smokeless tobacco. She reports that she does not drink alcohol and does not use drugs.  Medications  Current Facility-Administered Medications:    acetaminophen (TYLENOL) tablet 650 mg, 650 mg, Oral, Q4H PRN, Wynona Dove A, DO   amLODipine (NORVASC) tablet 5 mg, 5 mg, Oral, Daily, Lucrezia Starch, MD, 5 mg at 12/19/21 0857   atorvastatin (LIPITOR) tablet 80 mg, 80 mg, Oral, QHS, Lucrezia Starch, MD, 80 mg at 12/18/21  2351   carvedilol (COREG) tablet 12.5 mg, 12.5 mg, Oral, BID WC, Lucrezia Starch, MD, 12.5 mg at 12/19/21 0856   furosemide (LASIX) tablet 40 mg, 40 mg, Oral, Daily, Lucrezia Starch, MD, 40 mg at 12/19/21 0856   levETIRAcetam (KEPPRA) tablet 500 mg, 500 mg, Oral, BID, Lucrezia Starch, MD   potassium chloride (KLOR-CON M) CR tablet 10 mEq, 10 mEq, Oral, Once, Orma Flaming, MD   traZODone (DESYREL) tablet 50 mg, 50 mg, Oral, QHS PRN, Lucrezia Starch, MD   Exam: Current vital signs: BP 140/87 (BP Location: Left Arm)   Pulse 78   Temp 98.3 F (36.8 C)   Resp 20   Ht 5\' 2"  (1.575 m)   Wt 68.5 kg   LMP 05/22/2018 (Within Days)   SpO2 100%   BMI 27.62 kg/m  Vital signs in last 24 hours: Temp:  [97.8 F (36.6 C)-98.3 F (36.8 C)] 98.3 F (36.8 C) (07/10 1708) Pulse Rate:  [69-91] 78 (07/10 1706) Resp:  [13-22] 20 (07/10 1706) BP: (122-161)/(60-105) 140/87 (07/10 1706) SpO2:  [97 %-100 %] 100 % (07/10 1706) Weight:  [68.5 kg] 68.5 kg (07/09 2108)  GENERAL: Awake, alert in NAD HEENT: - Normocephalic and atraumatic, dry mm, no LN++, no Thyromegally LUNGS - Clear to auscultation bilaterally with no wheezes CV - S1S2 RRR, no m/r/g, equal pulses bilaterally. ABDOMEN - Soft, nontender, nondistended with normoactive BS Ext: warm, well perfused, intact peripheral pulses, no edema  NEURO:  Mental Status: AA&Ox3  Language: speech is clear.  Naming, repetition, fluency, and comprehension intact. Cranial Nerves: PERRL, Left INO, right eye does not cross midline, left eye with rotating nystagmus with leftward gazes, endorses diplopia, visual fields full, no facial asymmetry, facial sensation diminished on the left, hearing intact, tongue/uvula/soft palate midline, normal sternocleidomastoid and trapezius muscle strength. No evidence of tongue atrophy or fibrillations Motor:  RUE 5/5 Hand grasp 5/5 LUE4/5 hand grasp 4/5 RLE 5/5   LLE 4/5 Tone: is normal and bulk is  normal Sensation- Intact to light touch bilaterally Coordination: FTN intact bilaterally, no ataxia in BLE. Gait- deferred  NIHSS 1a Level of Conscious.: 0 1b LOC Questions: 0 1c LOC Commands: 0 2 Best Gaze: 1 3 Visual: 0 4 Facial Palsy: 0 5a Motor Arm - left: 1 5b Motor Arm - Right: 0 6a Motor Leg - Left: 1 6b Motor Leg - Right: 0 7 Limb Ataxia: 0 8 Sensory: 1 9 Best Language: 0 10 Dysarthria: 0 11 Extinct. and Inatten.: 0 TOTAL: 4   Labs I have reviewed labs in epic and the results pertinent to this consultation are:  CBC    Component Value Date/Time   WBC 5.4 12/18/2021 1822   RBC 3.12 (L) 12/18/2021 1822   HGB 9.5 (L) 12/18/2021 1834   HGB 9.8 (L) 08/20/2017 1503  HCT 28.0 (L) 12/18/2021 1834   HCT 29.2 (L) 08/20/2017 1503   PLT 205 12/18/2021 1822   PLT 308 08/20/2017 1503   MCV 90.1 12/18/2021 1822   MCV 84 08/20/2017 1503   MCH 29.2 12/18/2021 1822   MCHC 32.4 12/18/2021 1822   RDW 13.7 12/18/2021 1822   RDW 14.6 08/20/2017 1503   LYMPHSABS 2.2 12/18/2021 1822   LYMPHSABS 2.7 08/20/2017 1503   MONOABS 0.3 12/18/2021 1822   EOSABS 0.1 12/18/2021 1822   EOSABS 0.2 08/20/2017 1503   BASOSABS 0.0 12/18/2021 1822   BASOSABS 0.0 08/20/2017 1503    CMP     Component Value Date/Time   NA 146 (H) 12/18/2021 1834   NA 145 (H) 06/17/2020 1053   K 3.5 12/18/2021 1834   CL 111 12/18/2021 1834   CO2 23 12/18/2021 1822   GLUCOSE 117 (H) 12/18/2021 1834   BUN 44 (H) 12/18/2021 1834   BUN 28 (H) 06/17/2020 1053   CREATININE 3.20 (H) 12/18/2021 1834   CREATININE 1.73 (H) 04/23/2017 0943   CALCIUM 9.2 12/18/2021 1822   PROT 6.8 12/18/2021 1822   PROT 6.7 06/17/2020 1053   ALBUMIN 3.6 12/18/2021 1822   ALBUMIN 4.2 06/17/2020 1053   AST 15 12/18/2021 1822   ALT 15 12/18/2021 1822   ALKPHOS 46 12/18/2021 1822   BILITOT 0.5 12/18/2021 1822   BILITOT <0.2 06/17/2020 1053   GFRNONAA 18 (L) 12/18/2021 1822   GFRNONAA 36 (L) 04/23/2017 0943   GFRAA 27 (L)  06/17/2020 1053   GFRAA 41 (L) 04/23/2017 0943    Lipid Panel     Component Value Date/Time   CHOL 97 11/11/2021 0341   CHOL 135 06/17/2020 1053   TRIG 87 11/11/2021 0341   HDL 46 11/11/2021 0341   HDL 47 06/17/2020 1053   CHOLHDL 2.1 11/11/2021 0341   VLDL 17 11/11/2021 0341   LDLCALC 34 11/11/2021 0341   LDLCALC 63 06/17/2020 1053     Imaging I have reviewed the images obtained:  CT-head- Chronic ischemic changes without acute abnormality.  MRI examination of the brain- pending  6/2 Echo- EF 60-65%   Assessment: 51 y.o. female hypertension, hyperlipidemia, diabetes, CKD 4, seizure now off Keppra, several previous lacunar infarcts admitted for left side weakness and vision changes that started on Saturday. She has an extensive stroke history. Ha1C- 6.5, LDL 34. She is on ASA 81mg  and Brilinta 60mg . Atorvastatin 80mg . BP controlled with coreg 12.5, amlodipine 5mg , and hydralazine 50mg .  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  Impression: Stroke   Recommendations: - HgbA1c, fasting lipid panel - MRI of the brain without contrast - Frequent neuro checks - Prophylactic therapy-Antiplatelet med?  - Risk factor modification - Telemetry monitoring - PT consult, OT consult, Speech consult - Stroke team to follow    -- Patient seen and examined by NP/APP with MD. MD to update note as needed.   Janine Ores, DNP, FNP-BC [facility] Pager: [phone]  She has multiple frequent recurrent strokes, mostly appearing small vessel in etiology.  Likely due to her risk factors of diabetes, CKD, hypertension.  She will need an MRI to confirm her new strokes, with further evaluation per the stroke team.  Roland Rack, MD [facility] [phone]  If 7pm- 7am, please page neurology on call as listed in [place].

## 2021-12-19 NOTE — Assessment & Plan Note (Signed)
Baseline around 10, continue to monitor

## 2021-12-19 NOTE — Assessment & Plan Note (Signed)
Stable, continue SABA prn  

## 2021-12-19 NOTE — ED Notes (Signed)
Rounded on patient.  Offered water.  Able to reach for water from bedside table using left hand.  Good movement noted to left arm.

## 2021-12-19 NOTE — Assessment & Plan Note (Signed)
Continue cymbalta  

## 2021-12-19 NOTE — Assessment & Plan Note (Signed)
Continue protonix  

## 2021-12-19 NOTE — Assessment & Plan Note (Signed)
Continue to follow

## 2021-12-19 NOTE — Assessment & Plan Note (Addendum)
Seizure precautions. keppra was stopped in 05/2022

## 2021-12-19 NOTE — Assessment & Plan Note (Addendum)
Creatinine baseline around 2.7-3.0, relatively stable AKI -> trend Continue to monitor

## 2021-12-19 NOTE — Assessment & Plan Note (Addendum)
LDL 36 Continue high dose statin, lipitor 80mg 

## 2021-12-19 NOTE — Assessment & Plan Note (Addendum)
Continue home medication of coreg 12.5mg  BID, norvasc 5mg  daily Holding hydralzine 50mg  BID for now Hold lasix with aki

## 2021-12-19 NOTE — Assessment & Plan Note (Addendum)
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 who presented with 2-3 day history of left sided weakness and visual deficits MRI brain with 1.9 cm acute ischemic nonhemorrhagic infarct involving the deep white matter of the posterior right frontal centrum semi ovale, advanced chronic microvascular ischemic disease with multiple remote lacunar inarcts involving the bilateral basal ganglia, thalami, pons, and cerebellum, multiple chronic micro hemorrhages clustered about the brainstem and thalami consistent with chronic poorly controlled hypertension MRA head/neck normal intracranial MRA, normal MRA of the neck LDL 36, a1c 6.3 Neurology c/s, appreciate recs - aspirin, brilinta, statin, pending P2y12 (39) for consideration of adjusting brilinta dose.  May consider outpatient LP?

## 2021-12-20 ENCOUNTER — Telehealth (HOSPITAL_COMMUNITY): Payer: Self-pay | Admitting: Pharmacy Technician

## 2021-12-20 ENCOUNTER — Other Ambulatory Visit (HOSPITAL_COMMUNITY): Payer: Self-pay

## 2021-12-20 ENCOUNTER — Encounter (HOSPITAL_COMMUNITY): Payer: Self-pay

## 2021-12-20 DIAGNOSIS — Z8249 Family history of ischemic heart disease and other diseases of the circulatory system: Secondary | ICD-10-CM | POA: Diagnosis not present

## 2021-12-20 DIAGNOSIS — H539 Unspecified visual disturbance: Secondary | ICD-10-CM | POA: Diagnosis present

## 2021-12-20 DIAGNOSIS — I639 Cerebral infarction, unspecified: Secondary | ICD-10-CM

## 2021-12-20 DIAGNOSIS — H5121 Internuclear ophthalmoplegia, right eye: Secondary | ICD-10-CM | POA: Diagnosis not present

## 2021-12-20 DIAGNOSIS — E876 Hypokalemia: Secondary | ICD-10-CM | POA: Diagnosis present

## 2021-12-20 DIAGNOSIS — I63311 Cerebral infarction due to thrombosis of right middle cerebral artery: Secondary | ICD-10-CM

## 2021-12-20 DIAGNOSIS — H5120 Internuclear ophthalmoplegia, unspecified eye: Secondary | ICD-10-CM | POA: Diagnosis present

## 2021-12-20 DIAGNOSIS — Z803 Family history of malignant neoplasm of breast: Secondary | ICD-10-CM | POA: Diagnosis not present

## 2021-12-20 DIAGNOSIS — Z87442 Personal history of urinary calculi: Secondary | ICD-10-CM | POA: Diagnosis not present

## 2021-12-20 DIAGNOSIS — E1122 Type 2 diabetes mellitus with diabetic chronic kidney disease: Secondary | ICD-10-CM | POA: Diagnosis present

## 2021-12-20 DIAGNOSIS — J4520 Mild intermittent asthma, uncomplicated: Secondary | ICD-10-CM | POA: Diagnosis present

## 2021-12-20 DIAGNOSIS — N179 Acute kidney failure, unspecified: Secondary | ICD-10-CM | POA: Diagnosis not present

## 2021-12-20 DIAGNOSIS — Z833 Family history of diabetes mellitus: Secondary | ICD-10-CM | POA: Diagnosis not present

## 2021-12-20 DIAGNOSIS — Z880 Allergy status to penicillin: Secondary | ICD-10-CM | POA: Diagnosis not present

## 2021-12-20 DIAGNOSIS — Z20822 Contact with and (suspected) exposure to covid-19: Secondary | ICD-10-CM | POA: Diagnosis present

## 2021-12-20 DIAGNOSIS — I6381 Other cerebral infarction due to occlusion or stenosis of small artery: Secondary | ICD-10-CM | POA: Diagnosis present

## 2021-12-20 DIAGNOSIS — D631 Anemia in chronic kidney disease: Secondary | ICD-10-CM | POA: Diagnosis present

## 2021-12-20 DIAGNOSIS — E7800 Pure hypercholesterolemia, unspecified: Secondary | ICD-10-CM

## 2021-12-20 DIAGNOSIS — Z7982 Long term (current) use of aspirin: Secondary | ICD-10-CM | POA: Diagnosis not present

## 2021-12-20 DIAGNOSIS — E1140 Type 2 diabetes mellitus with diabetic neuropathy, unspecified: Secondary | ICD-10-CM | POA: Diagnosis present

## 2021-12-20 DIAGNOSIS — I69354 Hemiplegia and hemiparesis following cerebral infarction affecting left non-dominant side: Secondary | ICD-10-CM | POA: Diagnosis present

## 2021-12-20 DIAGNOSIS — G43909 Migraine, unspecified, not intractable, without status migrainosus: Secondary | ICD-10-CM | POA: Diagnosis present

## 2021-12-20 DIAGNOSIS — R29704 NIHSS score 4: Secondary | ICD-10-CM | POA: Diagnosis present

## 2021-12-20 DIAGNOSIS — I6930 Unspecified sequelae of cerebral infarction: Secondary | ICD-10-CM | POA: Diagnosis not present

## 2021-12-20 DIAGNOSIS — I6302 Cerebral infarction due to thrombosis of basilar artery: Secondary | ICD-10-CM

## 2021-12-20 DIAGNOSIS — N184 Chronic kidney disease, stage 4 (severe): Secondary | ICD-10-CM | POA: Diagnosis present

## 2021-12-20 DIAGNOSIS — Z87898 Personal history of other specified conditions: Secondary | ICD-10-CM

## 2021-12-20 DIAGNOSIS — E785 Hyperlipidemia, unspecified: Secondary | ICD-10-CM | POA: Diagnosis present

## 2021-12-20 DIAGNOSIS — D638 Anemia in other chronic diseases classified elsewhere: Secondary | ICD-10-CM | POA: Diagnosis not present

## 2021-12-20 DIAGNOSIS — Z888 Allergy status to other drugs, medicaments and biological substances status: Secondary | ICD-10-CM | POA: Diagnosis not present

## 2021-12-20 DIAGNOSIS — K219 Gastro-esophageal reflux disease without esophagitis: Secondary | ICD-10-CM | POA: Diagnosis present

## 2021-12-20 DIAGNOSIS — G40909 Epilepsy, unspecified, not intractable, without status epilepticus: Secondary | ICD-10-CM | POA: Diagnosis present

## 2021-12-20 DIAGNOSIS — Z7902 Long term (current) use of antithrombotics/antiplatelets: Secondary | ICD-10-CM | POA: Diagnosis not present

## 2021-12-20 DIAGNOSIS — F32A Depression, unspecified: Secondary | ICD-10-CM | POA: Diagnosis present

## 2021-12-20 DIAGNOSIS — E870 Hyperosmolality and hypernatremia: Secondary | ICD-10-CM | POA: Diagnosis present

## 2021-12-20 DIAGNOSIS — K5900 Constipation, unspecified: Secondary | ICD-10-CM | POA: Diagnosis not present

## 2021-12-20 DIAGNOSIS — E1151 Type 2 diabetes mellitus with diabetic peripheral angiopathy without gangrene: Secondary | ICD-10-CM | POA: Diagnosis present

## 2021-12-20 DIAGNOSIS — F419 Anxiety disorder, unspecified: Secondary | ICD-10-CM | POA: Diagnosis present

## 2021-12-20 DIAGNOSIS — I69398 Other sequelae of cerebral infarction: Secondary | ICD-10-CM | POA: Diagnosis not present

## 2021-12-20 DIAGNOSIS — J45909 Unspecified asthma, uncomplicated: Secondary | ICD-10-CM | POA: Diagnosis present

## 2021-12-20 DIAGNOSIS — G8194 Hemiplegia, unspecified affecting left nondominant side: Secondary | ICD-10-CM | POA: Diagnosis present

## 2021-12-20 DIAGNOSIS — E669 Obesity, unspecified: Secondary | ICD-10-CM | POA: Diagnosis not present

## 2021-12-20 DIAGNOSIS — R531 Weakness: Secondary | ICD-10-CM | POA: Diagnosis present

## 2021-12-20 DIAGNOSIS — I129 Hypertensive chronic kidney disease with stage 1 through stage 4 chronic kidney disease, or unspecified chronic kidney disease: Secondary | ICD-10-CM | POA: Diagnosis present

## 2021-12-20 LAB — CBC WITH DIFFERENTIAL/PLATELET
Abs Immature Granulocytes: 0.01 10*3/uL (ref 0.00–0.07)
Basophils Absolute: 0 10*3/uL (ref 0.0–0.1)
Basophils Relative: 1 %
Eosinophils Absolute: 0.1 10*3/uL (ref 0.0–0.5)
Eosinophils Relative: 2 %
HCT: 28.7 % — ABNORMAL LOW (ref 36.0–46.0)
Hemoglobin: 9.4 g/dL — ABNORMAL LOW (ref 12.0–15.0)
Immature Granulocytes: 0 %
Lymphocytes Relative: 57 %
Lymphs Abs: 2.5 10*3/uL (ref 0.7–4.0)
MCH: 29 pg (ref 26.0–34.0)
MCHC: 32.8 g/dL (ref 30.0–36.0)
MCV: 88.6 fL (ref 80.0–100.0)
Monocytes Absolute: 0.2 10*3/uL (ref 0.1–1.0)
Monocytes Relative: 5 %
Neutro Abs: 1.5 10*3/uL — ABNORMAL LOW (ref 1.7–7.7)
Neutrophils Relative %: 35 %
Platelets: 219 10*3/uL (ref 150–400)
RBC: 3.24 MIL/uL — ABNORMAL LOW (ref 3.87–5.11)
RDW: 13.3 % (ref 11.5–15.5)
WBC: 4.3 10*3/uL (ref 4.0–10.5)
nRBC: 0 % (ref 0.0–0.2)

## 2021-12-20 LAB — COMPREHENSIVE METABOLIC PANEL
ALT: 13 U/L (ref 0–44)
AST: 17 U/L (ref 15–41)
Albumin: 3.5 g/dL (ref 3.5–5.0)
Alkaline Phosphatase: 42 U/L (ref 38–126)
Anion gap: 10 (ref 5–15)
BUN: 42 mg/dL — ABNORMAL HIGH (ref 6–20)
CO2: 25 mmol/L (ref 22–32)
Calcium: 9.4 mg/dL (ref 8.9–10.3)
Chloride: 108 mmol/L (ref 98–111)
Creatinine, Ser: 3.2 mg/dL — ABNORMAL HIGH (ref 0.44–1.00)
GFR, Estimated: 17 mL/min — ABNORMAL LOW (ref >60)
Glucose, Bld: 109 mg/dL — ABNORMAL HIGH (ref 70–99)
Potassium: 3.4 mmol/L — ABNORMAL LOW (ref 3.5–5.1)
Sodium: 143 mmol/L (ref 135–145)
Total Bilirubin: 0.4 mg/dL (ref 0.3–1.2)
Total Protein: 6.4 g/dL — ABNORMAL LOW (ref 6.5–8.1)

## 2021-12-20 LAB — LIPID PANEL
Cholesterol: 100 mg/dL (ref 0–200)
HDL: 49 mg/dL (ref >40)
LDL Cholesterol: 36 mg/dL (ref 0–99)
Total CHOL/HDL Ratio: 2 RATIO
Triglycerides: 73 mg/dL (ref <150)
VLDL: 15 mg/dL (ref 0–40)

## 2021-12-20 LAB — C-REACTIVE PROTEIN: CRP: <0.5 mg/dL (ref <1.0)

## 2021-12-20 LAB — HEMOGLOBIN A1C
Hgb A1c MFr Bld: 6.3 % — ABNORMAL HIGH (ref 4.8–5.6)
Mean Plasma Glucose: 134.11 mg/dL

## 2021-12-20 LAB — SEDIMENTATION RATE: Sed Rate: 12 mm/hr (ref 0–22)

## 2021-12-20 NOTE — Progress Notes (Signed)
Occupational Therapy Treatment Patient Details Name: Christina Orozco MRN: [id number] DOB: 30-Jul-1970 Today's Date: 12/20/2021   History of present illness 51 y.o. female admitted 7/9 for left sided weakness, MRI revealing acute ischemia, nonhemorrhagic infarct involving deep white matter of posterior R frontal centrum semi ovale. PMH: Prior CVA 6/1, hypertension, hyperlipidemia, gout, diabetes, chronic kidney disease, prior strokes, history of seizures possibly secondary to PRES (2019) versus underlying epilepsy   OT comments  Pt seen for second session today to address visual impairments, taped full lens of pt's glasses over R eye, pt reporting reduced diplopia and improved visual clarity (R eye with cataracts at baseline). Pt with improved LUE function from time of evaluation, able to perform L shoulder AAROM to 80*, elbow and wrist flexion AAROM WFL, no active wrist extension noted. Pt able to perform L digit opposition with increased time and effort. Pt reporting pain at L lateral elbow and shoulder with AAROM, states she has gout/and prior rotator cuff injury, RN notified. Pt presenting with impairments listed below, will follow acutely. Continue to recommend AIR at d/c.   Recommendations for follow up therapy are one component of a multi-disciplinary discharge planning process, led by the attending physician.  Recommendations may be updated based on patient status, additional functional criteria and insurance authorization.    Follow Up Recommendations  Acute inpatient rehab (3hours/day)    Assistance Recommended at Discharge Intermittent Supervision/Assistance  Patient can return home with the following  Assistance with cooking/housework;Direct supervision/assist for financial management;Direct supervision/assist for medications management;Assist for transportation;A lot of help with walking and/or transfers;A little help with bathing/dressing/bathroom;Help with stairs or ramp for  entrance   Equipment Recommendations  None recommended by OT;Other (comment) (defer to next venue of care)    Recommendations for Other Services PT consult    Precautions / Restrictions Precautions Precautions: Fall Precaution Comments: fall last month Restrictions Weight Bearing Restrictions: No       Mobility Bed Mobility Overal bed mobility: Needs Assistance Bed Mobility: Sidelying to Sit, Sit to Sidelying   Sidelying to sit: Min guard     Sit to sidelying: Min guard General bed mobility comments: HoB elevated and use of bedrail    Transfers Overall transfer level: Needs assistance Equipment used: 1 person hand held assist Transfers: Sit to/from Stand Sit to Stand: Min assist                 Balance Overall balance assessment: Needs assistance Sitting-balance support: No upper extremity supported, Feet supported Sitting balance-Leahy Scale: Fair Sitting balance - Comments: can perform figure 4 for LB dressing   Standing balance support: Single extremity supported Standing balance-Leahy Scale: Fair                             ADL either performed or assessed with clinical judgement   ADL Overall ADL's : Needs assistance/impaired     Grooming: Minimal assistance;Standing   Upper Body Bathing: Sitting;Standing;Moderate assistance   Lower Body Bathing: Sitting/lateral leans;Sit to/from stand;Moderate assistance   Upper Body Dressing : Minimal assistance;Standing;Sitting   Lower Body Dressing: Minimal assistance;Sitting/lateral leans   Toilet Transfer: Minimal assistance;BSC/3in1;Ambulation   Toileting- Clothing Manipulation and Hygiene: Minimal assistance;Sitting/lateral lean;Sit to/from stand       Functional mobility during ADLs: Minimal assistance;Cueing for safety;Cueing for sequencing      Extremity/Trunk Assessment Upper Extremity Assessment LUE Deficits / Details: AAROM, shoulder flexion 80* (prior rotator cuff tear), elbow  WFL, wrist flexion  WFL, no activ extension noted. Able to perform digit opposition at decreased speed LUE Sensation: WNL LUE Coordination: decreased fine motor;decreased gross motor            Vision   Vision Assessment?: Vision impaired- to be further tested in functional context;Yes Eye Alignment: Impaired (comment) Ocular Range of Motion: Within Functional Limits Alignment/Gaze Preference: Within Defined Limits Tracking/Visual Pursuits: Decreased smoothness of horizontal tracking;Impaired - to be further tested in functional context;Right eye does not track medially Convergence: Impaired (comment) (R eye does not converge) Diplopia Assessment: Objects split on top of one another;Present all the time/all directions (worse with near gaze) Additional Comments: pt provided with vision occlusion glasses, taping whole R lens, pt reporting diplopia has reduced. Pt is R eye dominant, cataract in R eye at baseline.Pt described floating/blurry vision with L eye occluded, reduced diplopia with R eye covered   Perception Perception Perception: Impaired (L inattn)   Praxis Praxis Praxis: Not tested    Cognition Arousal/Alertness: Awake/alert Behavior During Therapy: WFL for tasks assessed/performed, Flat affect Overall Cognitive Status: Impaired/Different from baseline                                          Exercises      Shoulder Instructions       General Comments VSS on RA    Pertinent Vitals/ Pain       Pain Assessment Pain Assessment: Faces Pain Score: 6  Faces Pain Scale: Hurts even more Pain Location: At lateral L elbow and shoulder with ROM. Pain Descriptors / Indicators: Discomfort, Grimacing Pain Intervention(s): Limited activity within patient's tolerance, Monitored during session (RN notified)  Home Living                                          Prior Functioning/Environment              Frequency  Min 2X/week         Progress Toward Goals  OT Goals(current goals can now be found in the care plan section)  Progress towards OT goals: Progressing toward goals  Acute Rehab OT Goals Patient Stated Goal: none stated OT Goal Formulation: With patient Time For Goal Achievement: 01/03/22 Potential to Achieve Goals: Good ADL Goals Pt Will Perform Grooming: with modified independence;standing Pt Will Perform Upper Body Dressing: with min assist;sitting Pt Will Perform Lower Body Dressing: with min assist;sitting/lateral leans;sit to/from stand Pt Will Transfer to Toilet: with min assist;regular height toilet;ambulating Additional ADL Goal #1: pt will use LUE as functional assist during ADL tasks Additional ADL Goal #2: Pt will independently verbalize occusion taping protocal in preparation for ADLs  Plan Discharge plan remains appropriate;Frequency remains appropriate    Co-evaluation                 AM-PAC OT "6 Clicks" Daily Activity     Outcome Measure   Help from another person eating meals?: A Little Help from another person taking care of personal grooming?: A Little Help from another person toileting, which includes using toliet, bedpan, or urinal?: A Little Help from another person bathing (including washing, rinsing, drying)?: A Lot Help from another person to put on and taking off regular upper body clothing?: A Little Help from another person to put on and  taking off regular lower body clothing?: A Little 6 Click Score: 17    End of Session Equipment Utilized During Treatment: Gait belt  OT Visit Diagnosis: Other abnormalities of gait and mobility (R26.89);Other symptoms and signs involving cognitive function;Unsteadiness on feet (R26.81);Muscle weakness (generalized) (M62.81);Low vision, both eyes (H54.2);History of falling (Z91.81);Other symptoms and signs involving the nervous system (R29.898)   Activity Tolerance Patient tolerated treatment well   Patient Left in  bed;with call bell/phone within reach;with bed alarm set   Nurse Communication Mobility status        Time: 1645-1700 OT Time Calculation (min): 15 min  Charges: OT General Charges $OT Visit: 1 Visit  Lynnda Child, OTD, OTR/L Acute Rehab [phone] - [place] 12/20/2021, 5:20 PM

## 2021-12-20 NOTE — Plan of Care (Signed)

## 2021-12-20 NOTE — Telephone Encounter (Signed)
Pharmacy Patient Advocate Encounter  Insurance verification completed.    The patient is insured through [facility] [employer] [place]   The patient is currently admitted and ran test claims for the following: Brilinta.  Copays and coinsurance results were relayed to Inpatient clinical team.

## 2021-12-20 NOTE — Evaluation (Signed)
Occupational Therapy Evaluation Patient Details Name: Christina Orozco MRN: [id number] DOB: December 17, 1970 Today's Date: 12/20/2021   History of Present Illness 51 y.o. female admitted 7/9 for left sided weakness, MRI revealing acute ischemia, nonhemorrhagic infarct involving deep white matter of posterior R frontal centrum semi ovale. PMH: Prior CVA 6/1, hypertension, hyperlipidemia, diabetes, chronic kidney disease, prior strokes, history of seizures possibly secondary to PRES (2019) versus underlying epilepsy   Clinical Impression   Pt receives assistance at baseline from [facility] aide for bathing and IADLs, pt currenlty needing min-modA for ADLs, min guard for bed mobility, and min A for transfer and short ambulation distance in room with 1 person handheld assistance. Pt with impaired vision, reporting double vision and objects moving when opening eyes, will further assess in future sessions. Pt with trace LUE movement, increased tone and able to hold LUE up against gravity when placed there, PROM WFL. Pt presenting with impairments listed below, will follow acutely. Recommend AIR at d/c.     Recommendations for follow up therapy are one component of a multi-disciplinary discharge planning process, led by the attending physician.  Recommendations may be updated based on patient status, additional functional criteria and insurance authorization.   Follow Up Recommendations  Acute inpatient rehab (3hours/day)    Assistance Recommended at Discharge Intermittent Supervision/Assistance  Patient can return home with the following Assistance with cooking/housework;Direct supervision/assist for financial management;Direct supervision/assist for medications management;Assist for transportation;A lot of help with walking and/or transfers;A little help with bathing/dressing/bathroom;Help with stairs or ramp for entrance    Functional Status Assessment  Patient has had a recent decline in their functional  status and demonstrates the ability to make significant improvements in function in a reasonable and predictable amount of time.  Equipment Recommendations  None recommended by OT;Other (comment) (defer to next venue of care)    Recommendations for Other Services PT consult     Precautions / Restrictions Precautions Precautions: Fall Precaution Comments: fall last month Restrictions Weight Bearing Restrictions: No      Mobility Bed Mobility Overal bed mobility: Needs Assistance Bed Mobility: Sidelying to Sit, Sit to Sidelying   Sidelying to sit: Min guard     Sit to sidelying: Min guard General bed mobility comments: HoB elevated and use of bedrail    Transfers Overall transfer level: Needs assistance Equipment used: 1 person hand held assist Transfers: Sit to/from Stand Sit to Stand: Min assist                  Balance Overall balance assessment: Needs assistance Sitting-balance support: No upper extremity supported, Feet supported Sitting balance-Leahy Scale: Fair Sitting balance - Comments: can perform figure 4 for LB dressing   Standing balance support: Single extremity supported Standing balance-Leahy Scale: Fair                             ADL either performed or assessed with clinical judgement   ADL Overall ADL's : Needs assistance/impaired     Grooming: Minimal assistance;Standing   Upper Body Bathing: Sitting;Standing;Moderate assistance   Lower Body Bathing: Sitting/lateral leans;Sit to/from stand;Moderate assistance   Upper Body Dressing : Minimal assistance;Standing;Sitting   Lower Body Dressing: Minimal assistance;Sitting/lateral leans   Toilet Transfer: Minimal assistance;BSC/3in1;Ambulation   Toileting- Clothing Manipulation and Hygiene: Minimal assistance;Sitting/lateral lean;Sit to/from stand       Functional mobility during ADLs: Minimal assistance;Cueing for safety;Cueing for sequencing       Vision Ability to  See in Adequate Light: 1 Impaired Patient Visual Report: Diplopia;Nausea/blurring vision with head movement;Other (comment) (pt keeping eyes closed most of session) Vision Assessment?: Vision impaired- to be further tested in functional context Additional Comments: pt able to read large font on diet menu, and identify objects on L/R sides of room, states it looks like "objects are moving" and is having double vision that does not clear with 1 eye closed     Perception     Praxis      Pertinent Vitals/Pain Pain Assessment Pain Assessment: Faces Pain Score: 8  Faces Pain Scale: Hurts whole lot Pain Location: back of head and neck Pain Descriptors / Indicators: Discomfort Pain Intervention(s): Limited activity within patient's tolerance, Monitored during session, Repositioned     Hand Dominance Right   Extremity/Trunk Assessment Upper Extremity Assessment Upper Extremity Assessment: LUE deficits/detail LUE Deficits / Details: no AROM noted in LUE, 2/5 tone, trace elbow and digit movement (5th digit), no shoulder flexion, PROM WFL; able to identify all areas of LUE touched with vision occluded LUE Sensation: WNL LUE Coordination: decreased fine motor;decreased gross motor   Lower Extremity Assessment Lower Extremity Assessment: Defer to PT evaluation LLE Deficits / Details: residual deficits from prior CVA, AROM, strength grossly 3/5, apraxic at times with left ankle DF. LLE Sensation: decreased light touch;decreased proprioception LLE Coordination: decreased fine motor;decreased gross motor   Cervical / Trunk Assessment Cervical / Trunk Assessment: Normal   Communication Communication Communication: Expressive difficulties (slow speech)   Cognition Arousal/Alertness: Lethargic Behavior During Therapy: WFL for tasks assessed/performed, Flat affect Overall Cognitive Status: No family/caregiver present to determine baseline cognitive functioning                                        General Comments  VSS on RA    Exercises     Shoulder Instructions      Home Living Family/patient expects to be discharged to:: Private residence Living Arrangements: Children Available Help at Discharge: Available PRN/intermittently Type of Home: Apartment Home Access: Level entry     Home Layout: One level     Bathroom Shower/Tub: Teacher, early years/pre: Standard Bathroom Accessibility: No   Home Equipment: Conservation officer, nature (2 wheels);Cane - single point;Shower seat;Grab bars - tub/shower;Hand held shower head   Additional Comments: daughter is home during summer from college, and weekends when she is in school  Lives With:  (daughter)    Prior Functioning/Environment Prior Level of Function : Needs assist       Physical Assist : ADLs (physical)   ADLs (physical): IADLs;Bathing;Dressing Mobility Comments: uses cane ADLs Comments: has HHAide M/T 2 hours, W/Th/F 2:45 hrs; always has assist with shower, but independnet ADLs and aide assists with IADLs.  pt reports managing her medications with help of daughter        OT Problem List: Decreased activity tolerance;Impaired balance (sitting and/or standing);Decreased coordination;Impaired sensation;Decreased strength;Decreased range of motion;Impaired UE functional use;Impaired tone      OT Treatment/Interventions: Self-care/ADL training;Therapeutic exercise;DME and/or AE instruction;Therapeutic activities;Patient/family education;Balance training;Cognitive remediation/compensation;Neuromuscular education;Energy conservation;Visual/perceptual remediation/compensation    OT Goals(Current goals can be found in the care plan section) Acute Rehab OT Goals Patient Stated Goal: to get better OT Goal Formulation: With patient Time For Goal Achievement: 01/03/22 Potential to Achieve Goals: Good ADL Goals Pt Will Perform Upper Body Dressing: with min assist;sitting Pt Will Perform Lower Body  Dressing: with min assist;sitting/lateral leans;sit to/from stand Pt Will Transfer to Toilet: with min assist;regular height toilet;ambulating Additional ADL Goal #1: pt will use LUE as functional assist during ADL tasks  OT Frequency: Min 2X/week    Co-evaluation              AM-PAC OT "6 Clicks" Daily Activity     Outcome Measure Help from another person eating meals?: A Little Help from another person taking care of personal grooming?: A Little Help from another person toileting, which includes using toliet, bedpan, or urinal?: A Little Help from another person bathing (including washing, rinsing, drying)?: A Lot Help from another person to put on and taking off regular upper body clothing?: A Little Help from another person to put on and taking off regular lower body clothing?: A Little 6 Click Score: 17   End of Session Equipment Utilized During Treatment: Gait belt Nurse Communication: Mobility status  Activity Tolerance: Patient tolerated treatment well Patient left: in bed;with call bell/phone within reach;with bed alarm set  OT Visit Diagnosis: Other abnormalities of gait and mobility (R26.89);Other symptoms and signs involving cognitive function;Unsteadiness on feet (R26.81);Muscle weakness (generalized) (M62.81);Low vision, both eyes (H54.2);History of falling (Z91.81);Other symptoms and signs involving the nervous system (R29.898)                Time: 7824-2353 OT Time Calculation (min): 24 min Charges:  OT General Charges $OT Visit: 1 Visit OT Evaluation $OT Eval Moderate Complexity: 1 Mod OT Treatments $Self Care/Home Management : 8-22 mins  Lynnda Child, OTD, OTR/L Acute Rehab [phone]) [phone] - [place] 12/20/2021, 1:39 PM

## 2021-12-20 NOTE — Hospital Course (Addendum)
Christina Orozco is Christina Orozco 51 y.o. female with medical history significant of CKD stage IV, asthma, anxiety and depression, T2DM, HTN, GERD, PVD, hx of CVA on DAPT who presented to [facility] ED on 7/9 with complaints of left sided weakness and vision changes. Her MRI showed 1.9 cm acute ischemic nonhemorrhagic infarct involving the deep white matter of the posterior right frontal centrum semi ovale.  Discharging to CIR today.

## 2021-12-20 NOTE — Progress Notes (Addendum)
STROKE TEAM PROGRESS NOTE   INTERVAL HISTORY No Family at the bedside. She c/o severe dizziness and vision problems with double vision. She is alert and oriented x 3. Speech is clear, no aphasia or dysarthria, no facial droop. PERRL, She has a right INO, with nystagmus on leftward gaze. Visual fields full. Left arm and leg 4/5, right arm and leg 5/5. No ataxia   Vitals:   12/19/21 2132 12/20/21 0111 12/20/21 0527 12/20/21 0806  BP: 133/82 110/77 (!) 146/89 (!) 149/87  Pulse: 74 73 68 75  Resp: _0 Temp: 98.7 F (37.1 C) (!) 97.5 F (36.4 C) 97.6 F (36.4 C) 98.3 F (36.8 C)  TempSrc: Oral Oral Oral Oral  SpO2: 100% 100%  100%  Weight:      Height:       CBC:  Recent Labs  Lab 12/18/21 1822 12/18/21 1834  WBC 5.4  --   NEUTROABS 2.7  --   HGB 9.1* 9.5*  HCT 28.1* 28.0*  MCV 90.1  --   PLT 205  --    Basic Metabolic Panel:  Recent Labs  Lab 12/18/21 1822 12/18/21 1834 12/19/21 1810  NA 144 146*  --   K 3.4* 3.5  --   CL 111 111  --   CO2 23  --   --   GLUCOSE 117* 117*  --   BUN 41* 44*  --   CREATININE 3.12* 3.20*  --   CALCIUM 9.2  --   --   MG  --   --  2.1   Lipid Panel:  Recent Labs  Lab 12/20/21 0116  CHOL 100  TRIG 73  HDL 49  CHOLHDL 2.0  VLDL 15  LDLCALC 36   HgbA1c:  Recent Labs  Lab 12/20/21 0746  HGBA1C 6.3*   Urine Drug Screen: No results for input(s): "LABOPIA", "COCAINSCRNUR", "LABBENZ", "AMPHETMU", "THCU", "LABBARB" in the last 168 hours.  Alcohol Level  Recent Labs  Lab 12/18/21 1822  ETH <10    IMAGING past 24 hours MR BRAIN WO CONTRAST  Result Date: 12/19/2021 CLINICAL DATA:  Initial evaluation for neuro deficit, stroke suspected. EXAM: MRI HEAD WITHOUT CONTRAST MRA HEAD WITHOUT CONTRAST MRA NECK WITHOUT CONTRAST TECHNIQUE: Multiplanar, multiecho pulse sequences of the brain and surrounding structures were obtained without intravenous contrast. Angiographic images of the Circle of Willis were obtained using MRA  technique without intravenous contrast. Angiographic images of the neck were obtained using MRA technique without intravenous contrast. Carotid stenosis measurements (when applicable) are obtained utilizing NASCET criteria, using the distal internal carotid diameter as the denominator. COMPARISON:  Prior CT from 12/18/2021 as well as recent MRI from 11/10/2021. FINDINGS: MRI HEAD FINDINGS Brain: Cerebral volume stable, and within normal limits for age. Multifocal patchy and confluent T2/FLAIR hyperintensity involving the periventricular and deep white matter both cerebral hemispheres, with additional prominent involvement of the pons, most consistent with chronic small vessel ischemic disease, advanced for age. Multiple superimposed remote lacunar infarcts present about the bilateral basal ganglia, thalami, pons, and cerebellum. 1.9 cm acute ischemic infarct seen involving the deep white matter of the posterior right frontal centrum semi ovale (series 5, image 85). No associated hemorrhage or mass effect. No other evidence for acute or subacute ischemia. Previously seen small infarct involving the right midbrain has resolved. No acute intracranial hemorrhage. Multiple chronic micro hemorrhages noted clustered about the brainstem and thalami, consistent with chronic poorly controlled hypertension. No mass lesion, midline shift or mass effect.  No hydrocephalus or extra-axial fluid collection. Pituitary gland and suprasellar region within normal limits. Vascular: Major intracranial vascular flow voids are maintained. Skull and upper cervical spine: Craniocervical junction within normal limits. Postsurgical changes partially visualize within the cervical spine. Bone marrow signal intensity normal. Small lipoma noted at the right frontal scalp. Scalp soft tissues demonstrate no acute finding. Sinuses/Orbits: Prior ocular lens replacement on the left. Globes and orbital soft tissues demonstrate no acute finding. Scattered  mucosal thickening noted about the ethmoidal air cells and sphenoid sinuses. Paranasal sinuses are otherwise clear. No significant mastoid effusion. Other: None. MRA HEAD FINDINGS ANTERIOR CIRCULATION: Both internal carotid arteries patent to the termini without stenosis or other abnormality. A1 segments patent bilaterally. Normal anterior compartment complex. Anterior cerebral arteries widely patent to their distal aspects. No M1 stenosis or occlusion. No proximal MCA branch occlusion. Distal MCA branches perfused and symmetric. POSTERIOR CIRCULATION: Visualized distal V4 segments patent bilaterally. Left vertebral artery dominant. Partially visualized PICA are patent. Basilar patent to its distal aspect without stenosis. Superior cerebellar arteries patent bilaterally. Both PCAs primarily supplied via the basilar well perfused or distal aspects. Small left posterior communicating artery noted. No intracranial aneurysm. MRA NECK FINDINGS AORTIC ARCH: Aortic arch and origin of the great vessels not evaluated or seen on this noncontrast examination. RIGHT CAROTID SYSTEM: Visualized right common and internal carotid arteries are patent without stenosis or evidence for dissection. No significant atheromatous narrowing about the right carotid bulb. LEFT CAROTID SYSTEM: Visualized left common and internal carotid arteries patent without stenosis or evidence for dissection. No significant atheromatous narrowing about the left carotid bulb. VERTEBRAL ARTERIES: Partially visualized vertebral arteries patent without stenosis or evidence for dissection. Left vertebral artery dominant. Other: None. IMPRESSION: MRI HEAD: 1. 1.9 cm acute ischemic nonhemorrhagic infarct involving the deep white matter of the posterior right frontal centrum semi ovale. 2. Underlying advanced chronic microvascular ischemic disease with multiple remote lacunar infarcts involving the bilateral basal ganglia, thalami, pons, and cerebellum. 3. Multiple  chronic micro hemorrhages clustered about the brainstem and thalami, consistent with chronic poorly controlled hypertension. MRA HEAD: Normal intracranial MRA. No large vessel occlusion or hemodynamically significant stenosis. MRA NECK: Normal MRA of the neck. Electronically Signed   By: Jeannine Boga M.D.   On: 12/19/2021 22:04   MR ANGIO HEAD WO CONTRAST  Result Date: 12/19/2021 CLINICAL DATA:  Initial evaluation for neuro deficit, stroke suspected. EXAM: MRI HEAD WITHOUT CONTRAST MRA HEAD WITHOUT CONTRAST MRA NECK WITHOUT CONTRAST TECHNIQUE: Multiplanar, multiecho pulse sequences of the brain and surrounding structures were obtained without intravenous contrast. Angiographic images of the Circle of Willis were obtained using MRA technique without intravenous contrast. Angiographic images of the neck were obtained using MRA technique without intravenous contrast. Carotid stenosis measurements (when applicable) are obtained utilizing NASCET criteria, using the distal internal carotid diameter as the denominator. COMPARISON:  Prior CT from 12/18/2021 as well as recent MRI from 11/10/2021. FINDINGS: MRI HEAD FINDINGS Brain: Cerebral volume stable, and within normal limits for age. Multifocal patchy and confluent T2/FLAIR hyperintensity involving the periventricular and deep white matter both cerebral hemispheres, with additional prominent involvement of the pons, most consistent with chronic small vessel ischemic disease, advanced for age. Multiple superimposed remote lacunar infarcts present about the bilateral basal ganglia, thalami, pons, and cerebellum. 1.9 cm acute ischemic infarct seen involving the deep white matter of the posterior right frontal centrum semi ovale (series 5, image 85). No associated hemorrhage or mass effect. No other evidence for acute or  subacute ischemia. Previously seen small infarct involving the right midbrain has resolved. No acute intracranial hemorrhage. Multiple chronic  micro hemorrhages noted clustered about the brainstem and thalami, consistent with chronic poorly controlled hypertension. No mass lesion, midline shift or mass effect. No hydrocephalus or extra-axial fluid collection. Pituitary gland and suprasellar region within normal limits. Vascular: Major intracranial vascular flow voids are maintained. Skull and upper cervical spine: Craniocervical junction within normal limits. Postsurgical changes partially visualize within the cervical spine. Bone marrow signal intensity normal. Small lipoma noted at the right frontal scalp. Scalp soft tissues demonstrate no acute finding. Sinuses/Orbits: Prior ocular lens replacement on the left. Globes and orbital soft tissues demonstrate no acute finding. Scattered mucosal thickening noted about the ethmoidal air cells and sphenoid sinuses. Paranasal sinuses are otherwise clear. No significant mastoid effusion. Other: None. MRA HEAD FINDINGS ANTERIOR CIRCULATION: Both internal carotid arteries patent to the termini without stenosis or other abnormality. A1 segments patent bilaterally. Normal anterior compartment complex. Anterior cerebral arteries widely patent to their distal aspects. No M1 stenosis or occlusion. No proximal MCA branch occlusion. Distal MCA branches perfused and symmetric. POSTERIOR CIRCULATION: Visualized distal V4 segments patent bilaterally. Left vertebral artery dominant. Partially visualized PICA are patent. Basilar patent to its distal aspect without stenosis. Superior cerebellar arteries patent bilaterally. Both PCAs primarily supplied via the basilar well perfused or distal aspects. Small left posterior communicating artery noted. No intracranial aneurysm. MRA NECK FINDINGS AORTIC ARCH: Aortic arch and origin of the great vessels not evaluated or seen on this noncontrast examination. RIGHT CAROTID SYSTEM: Visualized right common and internal carotid arteries are patent without stenosis or evidence for  dissection. No significant atheromatous narrowing about the right carotid bulb. LEFT CAROTID SYSTEM: Visualized left common and internal carotid arteries patent without stenosis or evidence for dissection. No significant atheromatous narrowing about the left carotid bulb. VERTEBRAL ARTERIES: Partially visualized vertebral arteries patent without stenosis or evidence for dissection. Left vertebral artery dominant. Other: None. IMPRESSION: MRI HEAD: 1. 1.9 cm acute ischemic nonhemorrhagic infarct involving the deep white matter of the posterior right frontal centrum semi ovale. 2. Underlying advanced chronic microvascular ischemic disease with multiple remote lacunar infarcts involving the bilateral basal ganglia, thalami, pons, and cerebellum. 3. Multiple chronic micro hemorrhages clustered about the brainstem and thalami, consistent with chronic poorly controlled hypertension. MRA HEAD: Normal intracranial MRA. No large vessel occlusion or hemodynamically significant stenosis. MRA NECK: Normal MRA of the neck. Electronically Signed   By: Jeannine Boga M.D.   On: 12/19/2021 22:04   MR ANGIO NECK WO CONTRAST  Result Date: 12/19/2021 CLINICAL DATA:  Initial evaluation for neuro deficit, stroke suspected. EXAM: MRI HEAD WITHOUT CONTRAST MRA HEAD WITHOUT CONTRAST MRA NECK WITHOUT CONTRAST TECHNIQUE: Multiplanar, multiecho pulse sequences of the brain and surrounding structures were obtained without intravenous contrast. Angiographic images of the Circle of Willis were obtained using MRA technique without intravenous contrast. Angiographic images of the neck were obtained using MRA technique without intravenous contrast. Carotid stenosis measurements (when applicable) are obtained utilizing NASCET criteria, using the distal internal carotid diameter as the denominator. COMPARISON:  Prior CT from 12/18/2021 as well as recent MRI from 11/10/2021. FINDINGS: MRI HEAD FINDINGS Brain: Cerebral volume stable, and  within normal limits for age. Multifocal patchy and confluent T2/FLAIR hyperintensity involving the periventricular and deep white matter both cerebral hemispheres, with additional prominent involvement of the pons, most consistent with chronic small vessel ischemic disease, advanced for age. Multiple superimposed remote lacunar infarcts present about the  bilateral basal ganglia, thalami, pons, and cerebellum. 1.9 cm acute ischemic infarct seen involving the deep white matter of the posterior right frontal centrum semi ovale (series 5, image 85). No associated hemorrhage or mass effect. No other evidence for acute or subacute ischemia. Previously seen small infarct involving the right midbrain has resolved. No acute intracranial hemorrhage. Multiple chronic micro hemorrhages noted clustered about the brainstem and thalami, consistent with chronic poorly controlled hypertension. No mass lesion, midline shift or mass effect. No hydrocephalus or extra-axial fluid collection. Pituitary gland and suprasellar region within normal limits. Vascular: Major intracranial vascular flow voids are maintained. Skull and upper cervical spine: Craniocervical junction within normal limits. Postsurgical changes partially visualize within the cervical spine. Bone marrow signal intensity normal. Small lipoma noted at the right frontal scalp. Scalp soft tissues demonstrate no acute finding. Sinuses/Orbits: Prior ocular lens replacement on the left. Globes and orbital soft tissues demonstrate no acute finding. Scattered mucosal thickening noted about the ethmoidal air cells and sphenoid sinuses. Paranasal sinuses are otherwise clear. No significant mastoid effusion. Other: None. MRA HEAD FINDINGS ANTERIOR CIRCULATION: Both internal carotid arteries patent to the termini without stenosis or other abnormality. A1 segments patent bilaterally. Normal anterior compartment complex. Anterior cerebral arteries widely patent to their distal  aspects. No M1 stenosis or occlusion. No proximal MCA branch occlusion. Distal MCA branches perfused and symmetric. POSTERIOR CIRCULATION: Visualized distal V4 segments patent bilaterally. Left vertebral artery dominant. Partially visualized PICA are patent. Basilar patent to its distal aspect without stenosis. Superior cerebellar arteries patent bilaterally. Both PCAs primarily supplied via the basilar well perfused or distal aspects. Small left posterior communicating artery noted. No intracranial aneurysm. MRA NECK FINDINGS AORTIC ARCH: Aortic arch and origin of the great vessels not evaluated or seen on this noncontrast examination. RIGHT CAROTID SYSTEM: Visualized right common and internal carotid arteries are patent without stenosis or evidence for dissection. No significant atheromatous narrowing about the right carotid bulb. LEFT CAROTID SYSTEM: Visualized left common and internal carotid arteries patent without stenosis or evidence for dissection. No significant atheromatous narrowing about the left carotid bulb. VERTEBRAL ARTERIES: Partially visualized vertebral arteries patent without stenosis or evidence for dissection. Left vertebral artery dominant. Other: None. IMPRESSION: MRI HEAD: 1. 1.9 cm acute ischemic nonhemorrhagic infarct involving the deep white matter of the posterior right frontal centrum semi ovale. 2. Underlying advanced chronic microvascular ischemic disease with multiple remote lacunar infarcts involving the bilateral basal ganglia, thalami, pons, and cerebellum. 3. Multiple chronic micro hemorrhages clustered about the brainstem and thalami, consistent with chronic poorly controlled hypertension. MRA HEAD: Normal intracranial MRA. No large vessel occlusion or hemodynamically significant stenosis. MRA NECK: Normal MRA of the neck. Electronically Signed   By: Jeannine Boga M.D.   On: 12/19/2021 22:04    PHYSICAL EXAM She is alert and oriented x 3. Speech is clear, no aphasia or  dysarthria, no facial droop. PERRL, She has a right INO, with nystagmus on leftward gaze. Visual fields full. Left arm and leg 4/5, right arm and leg 5/5. No ataxia Sensation intact bilaterally  ASSESSMENT/PLAN Ms. Sarabella Baumert is a 51 y.o. female with history of hypertension, hyperlipidemia, diabetes, CKD 4, seizure now off Keppra, several previous lacunar infarcts admitted for left side weakness and vision changes that started on Saturday.   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 DDx including vasculitis but MRA head and neck not consistent with vasculitis. May consider outpt LP with holding brilinta,  but likely low yield. CT head Chronic ischemic changes without acute abnormality MRI - 1.9 cm acute ischemic nonhemorrhagic infarct involving the deep white matter of the posterior right frontal centrum semi ovale. Also left pontine punctate restriction signal likely lacunar infarct. Multiple remote lacunar infarcts involving the bilateral basal ganglia, thalami, pons, and cerebellum. Multiple chronic micro hemorrhages clustered about the brainstem and thalami, consistent with chronic poorly controlled hypertension. MRA Head and neck - Normal intracranial MRA Carotid Doppler  6/3 unremarkable    2D Echo 6/2 EF 60-65% ESR and CRP neg LDL 36 HgbA1c 6.3 P2Y12 pending VTE prophylaxis - SCD's aspirin 81 mg daily and Brilinta 22m BID  prior to admission, now on aspirin 81 mg daily and Brilinta 672mBID . Will check P2y12 tomorrow and consider increasing Brilinta to [id number] needed.  Therapy recommendations:  CIR Disposition:  pending  Hx stroke/TIA 08/2015 CSF neg, protein 58 07/2017 MRI showed left pontine infarct, EF 65 to 70%, carotid Doppler and MRA negative.  LDL 43, A1c 12.5, patient put on DAPT and Lipitor 40. 08/2017 Loop recorder placed, no A. fib found 02/27/2019 woke up with complete vision loss, gradually vision  resumed OD, still has better vision OS.  MRI at that time showed 3 mm acute to early subacute infarct in the dorsal midline of the pons negative MRI head and neck. 07/2019 loop recorder explanted 05/01/2020 MRI showed new T2 hyperintense lesion of the right frontal white matter likely reflect an area of late subacute or chronic infarction. 06/19/2020 Subcentimeter likely subacute infarct at the left lateral aspect of the pons.  MRA head unremarkable, carotid Doppler unremarkable, EF 70 to 75%.  LDL 63, A1c 6.2.  Put on aspirin and Brilinta for 1 months and then back to aspirin and the Plavix.  Continue Lipitor 80. 11/10/2021 - MRI showed possible small right cerebral peduncle infarcts.  MRA negative.  Carotid Doppler unremarkable.  EF 60 to 65%, LDL 34, A1c 6.5, discharged with ASA and brilinta 90 twice daily.  Outpatient follow-up with [facility], continued on aspirin and Brilinta 60 twice daily.  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, discharged with Keppra 03/2018 CSF showed WBC 30, RBC 0, protein 57, glucose 221, infectious work up neg  Off Keppra 05/2021, so far no recurrence Follow-up with [facility] neurolog[facility]  Hypertension Home meds:  norvasc, coreg, hydralazine Stable Long-term BP goal normotensive  Hyperlipidemia Home meds:  atorvastatin 80 mg, resumed in hospital LDL 36, goal < 70 Continue statin at discharge  Diabetes type II Controlled Home meds:  none HgbA1c 6.3, goal < 7.0 CBGs SSI Close PCP follow up  Other Stroke Risk Factors   Other Active Problems CKD stage 4 - Cre 2.61->3.20 Anxiety GERD  Hospital day # 0  DenBeulah GandyP, ACNPC-AG  ATTENDING NOTE: I reviewed above note and agree with the assessment and plan. Pt was seen and examined.   [age]ar old female with history of hypertension, hyperlipidemia, diabetes, CKD, seizure in the setting of PRES, history of strokes (as above) admitted for left-sided weakness, eye movement  difficulty and diplopia.  MRI showed right frontal small semiovale infarct, also possible left pontine punctate infarct.  MRA head and neck negative, no concern of vasculitis.  EF 60 to 65% in 11/2021, LDL 36 and A1c 6.3.  Creatinine 3.20.  ESR and CRP negative.  Hemoglobin 9.5.  On exam, patient awake alert orientated x3, no aphasia, follows some commands.  Still has diplopia with right  INO, left eye abduction with rotational nystagmus, bilateral eye right gaze sustained horizontal nystagmus.  Left eye downward gaze incomplete.  Left upper and lower extremity barely 3/5 due to pain in the shoulder and the knee.  Sensation symmetrical, bilateral finger-to-nose no ataxia but very slow on the left.  Etiology for patient stroke still concerning for some vessel disease given location and risk factors.  Patient eye-movement symptoms likely localized to brainstem, especially pontine.  DDx including vasculitis, however ESR/CRP negative, MRA head and neck normal, not consistent with vasculitis.  But may consider LP as outpatient after holding off Brilinta.  Currently on aspirin 81 and Brilinta 60 mg home medication, will check P2 Y12, may consider Brilinta 90 mg twice daily if needed.  Continue statin.  Patient still has CKD 4, close monitoring.  PT/OT recommend CIR.  We will follow.  For detailed assessment and plan, please refer to above/below as I have made changes wherever appropriate.   [name], MD PhD Stroke Neurology 12/20/2021 10:37 PM  I discussed with Dr. Florene Glen. I spent extensive time with the patient, more than 50% of which was spent in counseling and coordination of care, reviewing test results, images and medication, and discussing the diagnosis, treatment plan and potential prognosis. This patient's care requiresreview of multiple databases, neurological assessment, discussion with family, other specialists and medical decision making of high complexity.      To contact Stroke Continuity  provider, please refer to [email or web address] After hours, contact General Neurology

## 2021-12-20 NOTE — Evaluation (Signed)
Physical Therapy Evaluation Patient Details Name: Christina Orozco MRN: [id number] DOB: [birth year] Today's Date: 12/20/2021  History of Present Illness  51 y.o. female admitted 7/9 for left sided weakness, possible CVA. 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 Her reported history is a little convoluted; stating no significant deficits on her left side following prior admission last month however our notes indicate otherwise with significant weakness on non-dominant side. She has apraxic deficits keeping arm raised in the air after being assisted to hold above her head, and was unable to release RW despite showing ability to actually open and close her hand. She required min assist with ambulation and transfers, showing improved stability while using a walker for support (previously using a SPC at home.) Is motivated and agreeable to AIR if she qualifies. Pt currently with functional limitations due to the deficits listed below (see PT Problem List). Pt will benefit from skilled PT to increase their independence and safety with mobility to allow discharge to the venue listed below.          Recommendations for follow up therapy are one component of a multi-disciplinary discharge planning process, led by the attending physician.  Recommendations may be updated based on patient status, additional functional criteria and insurance authorization.  Follow Up Recommendations Acute inpatient rehab (3hours/day)      Assistance Recommended at Discharge    Patient can return home with the following  Assistance with cooking/housework;A little help with walking and/or transfers;A little help with bathing/dressing/bathroom;Direct supervision/assist for medications management;Direct supervision/assist for financial  management;Assist for transportation;Help with stairs or ramp for entrance    Equipment Recommendations None recommended by PT (TBD next venue of care)  Recommendations for Other Services  Rehab consult    Functional Status Assessment Patient has had a recent decline in their functional status and demonstrates the ability to make significant improvements in function in a reasonable and predictable amount of time.     Precautions / Restrictions Precautions Precautions: Fall Precaution Comments: fall last month Restrictions Weight Bearing Restrictions: No      Mobility  Bed Mobility Overal bed mobility: Modified Independent             General bed mobility comments: HoB elevated and use of bedrail    Transfers Overall transfer level: Needs assistance Equipment used: Rolling walker (2 wheels) Transfers: Sit to/from Stand Sit to Stand: Min assist           General transfer comment: Min assist with cues for technique, required assistance to place LUE onto RW upon standing. Some posterior instability noted.    Ambulation/Gait Ambulation/Gait assistance: Min assist Gait Distance (Feet): 80 Feet Assistive device: Rolling walker (2 wheels), None Gait Pattern/deviations: Step-through pattern, Decreased stance time - left, Decreased step length - right, Ataxic, Drifts right/left, Decreased dorsiflexion - left, Steppage Gait velocity: slowed Gait velocity interpretation: <1.31 ft/sec, indicative of household ambulator   General Gait Details: Min assist for balance without AD. Educed on AD use with RW, cues for Lt foot clearance, showed compensatory pattern with steppage due to poor Lt foot clearance initially. Intermittent min assist for RW control. Showing reduced awareness of hazards on Rt side, required cues.  Stairs            Wheelchair Mobility    Modified Rankin (Stroke Patients Only) Modified Rankin (Stroke Patients Only) Pre-Morbid  Rankin Score: Moderate  disability Modified Rankin: Moderately severe disability     Balance Overall balance assessment: Needs assistance Sitting-balance support: No upper extremity supported, Feet supported Sitting balance-Leahy Scale: Fair Sitting balance - Comments: Some sway noted, posterior lean at times. Postural control: Posterior lean Standing balance support: No upper extremity supported, During functional activity Standing balance-Leahy Scale: Fair Standing balance comment: Stands for short periods without UE support however shows posterior LOB and delayed righting reactions.                             Pertinent Vitals/Pain Pain Assessment Pain Assessment: 0-10 Pain Score: 8  Pain Location: Left foot when weight bearing. Back of head an neck Pain Descriptors / Indicators: Discomfort Pain Intervention(s): Monitored during session, Repositioned, Limited activity within patient's tolerance    Home Living Family/patient expects to be discharged to:: Private residence Living Arrangements: Children Available Help at Discharge: Available PRN/intermittently Type of Home: Apartment Home Access: Level entry       Home Layout: One level Home Equipment: Conservation officer, nature (2 wheels);Cane - single point;Shower seat;Grab bars - tub/shower;Hand held shower head Additional Comments: Daughter is home from college for the summer, but works    Prior Function Prior Level of Function : Needs assist       Physical Assist : ADLs (physical)   ADLs (physical): IADLs;Bathing;Dressing Mobility Comments: ambulates with cane at baseline, with limited community ambulation ADLs Comments: has HHAide M/T 2 hours, W/Th/F 2:45 hrs; always has assist with shower, but independnet ADLs and aide assists with IADLs.  pt reports driving and managing her medications     Hand Dominance   Dominant Hand: Right    Extremity/Trunk Assessment   Upper Extremity Assessment Upper Extremity Assessment: Defer to OT  evaluation    Lower Extremity Assessment Lower Extremity Assessment: LLE deficits/detail LLE Deficits / Details: residual deficits from prior CVA, AROM, strength grossly 3/5, apraxic at times with left ankle DF. LLE Sensation: decreased light touch;decreased proprioception LLE Coordination: decreased fine motor;decreased gross motor    Cervical / Trunk Assessment Cervical / Trunk Assessment: Normal  Communication   Communication: Expressive difficulties (mild slurred, slow, speech)  Cognition Arousal/Alertness: Awake/alert Behavior During Therapy: WFL for tasks assessed/performed, Flat affect Overall Cognitive Status: No family/caregiver present to determine baseline cognitive functioning                                 General Comments: Pt states no left sided deficits following prior CVA last month, however there was notable loss of Lt sided function based on notes/assessments from prior admission.        General Comments General comments (skin integrity, edema, etc.): Pt history from prior CVA not consistent with prior and current deficits, she may have some impaired memory. Gaze induced nystagmus present, apraxia - difficulty lifting hand and leg, unable to put arm back down once assisted to rise.    Exercises     Assessment/Plan    PT Assessment Patient needs continued PT services  PT Problem List Decreased balance;Decreased mobility;Decreased coordination;Decreased strength;Decreased range of motion;Decreased activity tolerance;Decreased cognition;Decreased knowledge of use of DME;Impaired sensation;Pain       PT Treatment Interventions Gait training;Functional mobility training;Therapeutic activities;Therapeutic exercise;Balance training;Patient/family education;DME instruction;Neuromuscular re-education;Cognitive remediation    PT Goals (Current goals can be found in the Care Plan section)  Acute Rehab PT Goals Patient Stated  Goal: Be able to see normally  again PT Goal Formulation: With patient Time For Goal Achievement: 12/09/21 Potential to Achieve Goals: Fair    Frequency Min 4X/week     Co-evaluation               AM-PAC PT "6 Clicks" Mobility  Outcome Measure Help needed turning from your back to your side while in a flat bed without using bedrails?: None Help needed moving from lying on your back to sitting on the side of a flat bed without using bedrails?: None Help needed moving to and from a bed to a chair (including a wheelchair)?: A Little Help needed standing up from a chair using your arms (e.g., wheelchair or bedside chair)?: A Little   Help needed climbing 3-5 steps with a railing? : A Lot 6 Click Score: 16    End of Session Equipment Utilized During Treatment: Gait belt Activity Tolerance: Patient tolerated treatment well Patient left: in bed;with call bell/phone within reach;with bed alarm set   PT Visit Diagnosis: Unsteadiness on feet (R26.81);History of falling (Z91.81);Other symptoms and signs involving the nervous system (R29.898);Ataxic gait (R26.0);Other abnormalities of gait and mobility (R26.89);Apraxia (R48.2);Difficulty in walking, not elsewhere classified (R26.2);Hemiplegia and hemiparesis Hemiplegia - Right/Left: Left Hemiplegia - dominant/non-dominant: Non-dominant Hemiplegia - caused by: Unspecified    Time: 1024-1050 PT Time Calculation (min) (ACUTE ONLY): 26 min   Charges:   PT Evaluation $PT Eval Moderate Complexity: 1 Mod PT Treatments $Gait Training: 8-22 mins        Candie Mile, PT   Ellouise Newer 12/20/2021, 11:21 AM

## 2021-12-20 NOTE — Progress Notes (Signed)
Inpatient Rehab Admissions Coordinator:  ? ?Per therapy recommendations,  patient was screened for CIR candidacy by Cincere Deprey, MS, CCC-SLP. At this time, Pt. Appears to be a a potential candidate for CIR. I will place   order for rehab consult per protocol for full assessment. Please contact me any with questions. ? ?Zyanya Glaza, MS, CCC-SLP ?Rehab Admissions Coordinator  ?[phone] (celll) ?[phone] (office) ? ?

## 2021-12-20 NOTE — Assessment & Plan Note (Signed)
Mild, follow °

## 2021-12-20 NOTE — Progress Notes (Signed)
PROGRESS NOTE    Christina Orozco  GMW:[id number] DOB: 1971-02-24 DOA: 12/18/2021 PCP: Jenel Lucks, PA-C  Chief Complaint  Patient presents with   Weakness    Brief Narrative:   Christina Orozco is Christina Orozco 51 y.o. female with medical history significant of CKD stage IV, asthma, anxiety and depression, T2DM, HTN, GERD, PVD, hx of CVA on DAPT who presented to [facility] ED on 7/9 with complaints of left sided weakness and vision changes. She states when she woke up on Saturday Ams she had weakness in her left arm. She had no tingling or numbness, but did have decreased grip strength. On Sunday AM she had left leg weakness and was dragging her leg. She also had vision changes and couldn't see. She feels like she is having depth perception problems and gets dizzy when she looks to the left. Everything was distorted with some double vision. She has Hargun Spurling picture where her left eye is turned outwards. She knew something wasn't right so came to ED. She came to ER and was put in waiting room, but didn't want to stay after waiting 3 hours so left and went to [facility].   Recent hospitalization in June 2023 for possible CVA.   MRI showed possible small right cerebral peduncle infarcts due to small vessel disease. She was discharged with instructions to take Brilinta 90mg  BID for 30 days and then resume ASA and Plavix. She followed up with [facility] neurology outpatient and they switched her from Plavix to Brilinta 60mg  BID and ASA 81mg . Her neurologist does have her on aimovig for migraines as well. She has been compliant with her medications. At her appointment with [facility] it was noted that she did have an episode of confusion. Her neurologist ordered an EEG outpatient and was considering resuming her keppra if it was abnormal   Denies any fever/chills, + headaches, no palpitations, shortness of breath or cough, + abdominal pain, N/V/D, dysuria or leg swelling. Complains of smelling cigarette smoke and  burning.      She does not smoke or drink alcohol. History of multiple strokes and is on brillinta 60mg  and ASA.    ER Course:  vitals: afebrile, bP: 149/91, HR 82, RR: 18, oxygen: 99%on RA Pertinent labs: hgb: 9.1, potassium: 3.4, BUN: 41, creatinine: 3.12,  CT head: no acute finding  In ED: discussed with Dr. Leonel Ramsay from neurology who recommended getting brain MRI/MRA.     Assessment & Plan:   Principal Problem:   Left-sided weakness and visual deficits  Active Problems:   Hypokalemia   Hypernatremia   History of CVA with residual deficit   CKD (chronic kidney disease) stage 4, GFR 15-29 ml/min (HCC)   History of seizure   Hypertension   Hyperlipidemia   Asthma, mild intermittent   Anemia of chronic disease   Generalized anxiety disorder   Gastroesophageal reflux disease without esophagitis   Stroke (HCC)   Vision abnormalities   Assessment and Plan: * Left-sided weakness and visual deficits  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 who presented with 2-3 day history of left sided weakness and visual deficits MRI brain with 1.9 cm acute ischemic nonhemorrhagic infarct involving the deep white matter of the posterior right frontal centrum semi ovale, advanced chronic microvascular ischemic disease with multiple remote lacunar inarcts involving the bilateral basal ganglia, thalami, pons, and cerebellum, multiple chronic micro hemorrhages clustered about the brainstem and thalami consistent with chronic poorly controlled hypertension MRA head/neck normal intracranial MRA,  normal MRA of the neck LDL 36, a1c 6.3 Neurology c/s, appreciate recs - aspirin, brilinta, statin, pending P2y12 for consideration of adjusting brilinta dose  Hypernatremia Mild, follow  Hypokalemia Continue to follow  History of CVA with residual deficit Continue DAPT Stroke w/u per above  CKD (chronic kidney disease) stage 4, GFR 15-29 ml/min (HCC) Creatinine baseline around  2.7-3.0, relatively stable Continue to monitor  History of seizure Seizure precautions. No longer on keppra.    Hypertension Continue home medication of coreg 12.5mg  BID, norvasc 5mg  daily, hydralzine 50mg  BID and lasix 40mg  daily   Hyperlipidemia LDL 36 Continue high dose statin, lipitor 80mg    Asthma, mild intermittent Stable, continue SABA prn   Anemia of chronic disease Baseline around 10, continue to monitor   Generalized anxiety disorder Continue cymbalta   Gastroesophageal reflux disease without esophagitis Continue protonix      DVT prophylaxis: SCD Code Status: full Family Communication: none Disposition:   Status is: Inpatient Remains inpatient appropriate because: need for continued stroke workup   Consultants:  neurology  Procedures:  none  Antimicrobials:  Anti-infectives (From admission, onward)    None       Subjective: C/o L sided weakness Eye issues  Objective: Vitals:   12/20/21 0806 12/20/21 1250 12/20/21 1530 12/20/21 2004  BP: (!) 149/87 136/89 133/85 (!) 143/83  Pulse: 75 75 75 79  Resp: 20 17 20 20   Temp: 98.3 F (36.8 C) (!) 97.5 F (36.4 C) 98.2 F (36.8 C) 98.1 F (36.7 C)  TempSrc: Oral Oral Oral Oral  SpO2: 100% 100% 100% 100%  Weight:      Height:        Intake/Output Summary (Last 24 hours) at 12/20/2021 2006 Last data filed at 12/20/2021 1600 Gross per 24 hour  Intake --  Output 925 ml  Net -925 ml   Filed Weights   12/18/21 2108  Weight: 68.5 kg    Examination:  General: No acute distress. Cardiovascular: Heart sounds show Alvine Mostafa regular rate, and rhythm. No gallops or rubs. No murmurs. No JVD. Lungs: Clear to auscultation bilaterally with good air movement. No rales, rhonchi or wheezes. Abdomen: Soft, nontender, nondistended with normal active bowel sounds. No masses. No hepatosplenomegaly. Neurological: decreased sensation to L side of face, inability to move R eye medially, weakness to L  side Extremities: No clubbing or cyanosis. No edema.   Data Reviewed: I have personally reviewed following labs and imaging studies  CBC: Recent Labs  Lab 12/18/21 1822 12/18/21 1834  WBC 5.4  --   NEUTROABS 2.7  --   HGB 9.1* 9.5*  HCT 28.1* 28.0*  MCV 90.1  --   PLT 205  --     Basic Metabolic Panel: Recent Labs  Lab 12/18/21 1822 12/18/21 1834 12/19/21 1810  NA 144 146*  --   K 3.4* 3.5  --   CL 111 111  --   CO2 23  --   --   GLUCOSE 117* 117*  --   BUN 41* 44*  --   CREATININE 3.12* 3.20*  --   CALCIUM 9.2  --   --   MG  --   --  2.1    GFR: Estimated Creatinine Clearance: 19.5 mL/min (Huan Pollok) (by C-G formula based on SCr of 3.2 mg/dL (H)).  Liver Function Tests: Recent Labs  Lab 12/18/21 1822  AST 15  ALT 15  ALKPHOS 46  BILITOT 0.5  PROT 6.8  ALBUMIN 3.6    CBG:  Recent Labs  Lab 12/18/21 2117  GLUCAP 105*     No results found for this or any previous visit (from the past 240 hour(s)).       Radiology Studies: MR BRAIN WO CONTRAST  Result Date: 12/19/2021 CLINICAL DATA:  Initial evaluation for neuro deficit, stroke suspected. EXAM: MRI HEAD WITHOUT CONTRAST MRA HEAD WITHOUT CONTRAST MRA NECK WITHOUT CONTRAST TECHNIQUE: Multiplanar, multiecho pulse sequences of the brain and surrounding structures were obtained without intravenous contrast. Angiographic images of the Circle of Willis were obtained using MRA technique without intravenous contrast. Angiographic images of the neck were obtained using MRA technique without intravenous contrast. Carotid stenosis measurements (when applicable) are obtained utilizing NASCET criteria, using the distal internal carotid diameter as the denominator. COMPARISON:  Prior CT from 12/18/2021 as well as recent MRI from 11/10/2021. FINDINGS: MRI HEAD FINDINGS Brain: Cerebral volume stable, and within normal limits for age. Multifocal patchy and confluent T2/FLAIR hyperintensity involving the periventricular and deep  white matter both cerebral hemispheres, with additional prominent involvement of the pons, most consistent with chronic small vessel ischemic disease, advanced for age. Multiple superimposed remote lacunar infarcts present about the bilateral basal ganglia, thalami, pons, and cerebellum. 1.9 cm acute ischemic infarct seen involving the deep white matter of the posterior right frontal centrum semi ovale (series 5, image 85). No associated hemorrhage or mass effect. No other evidence for acute or subacute ischemia. Previously seen small infarct involving the right midbrain has resolved. No acute intracranial hemorrhage. Multiple chronic micro hemorrhages noted clustered about the brainstem and thalami, consistent with chronic poorly controlled hypertension. No mass lesion, midline shift or mass effect. No hydrocephalus or extra-axial fluid collection. Pituitary gland and suprasellar region within normal limits. Vascular: Major intracranial vascular flow voids are maintained. Skull and upper cervical spine: Craniocervical junction within normal limits. Postsurgical changes partially visualize within the cervical spine. Bone marrow signal intensity normal. Small lipoma noted at the right frontal scalp. Scalp soft tissues demonstrate no acute finding. Sinuses/Orbits: Prior ocular lens replacement on the left. Globes and orbital soft tissues demonstrate no acute finding. Scattered mucosal thickening noted about the ethmoidal air cells and sphenoid sinuses. Paranasal sinuses are otherwise clear. No significant mastoid effusion. Other: None. MRA HEAD FINDINGS ANTERIOR CIRCULATION: Both internal carotid arteries patent to the termini without stenosis or other abnormality. A1 segments patent bilaterally. Normal anterior compartment complex. Anterior cerebral arteries widely patent to their distal aspects. No M1 stenosis or occlusion. No proximal MCA branch occlusion. Distal MCA branches perfused and symmetric. POSTERIOR  CIRCULATION: Visualized distal V4 segments patent bilaterally. Left vertebral artery dominant. Partially visualized PICA are patent. Basilar patent to its distal aspect without stenosis. Superior cerebellar arteries patent bilaterally. Both PCAs primarily supplied via the basilar well perfused or distal aspects. Small left posterior communicating artery noted. No intracranial aneurysm. MRA NECK FINDINGS AORTIC ARCH: Aortic arch and origin of the great vessels not evaluated or seen on this noncontrast examination. RIGHT CAROTID SYSTEM: Visualized right common and internal carotid arteries are patent without stenosis or evidence for dissection. No significant atheromatous narrowing about the right carotid bulb. LEFT CAROTID SYSTEM: Visualized left common and internal carotid arteries patent without stenosis or evidence for dissection. No significant atheromatous narrowing about the left carotid bulb. VERTEBRAL ARTERIES: Partially visualized vertebral arteries patent without stenosis or evidence for dissection. Left vertebral artery dominant. Other: None. IMPRESSION: MRI HEAD: 1. 1.9 cm acute ischemic nonhemorrhagic infarct involving the deep white matter of the posterior right frontal centrum semi  ovale. 2. Underlying advanced chronic microvascular ischemic disease with multiple remote lacunar infarcts involving the bilateral basal ganglia, thalami, pons, and cerebellum. 3. Multiple chronic micro hemorrhages clustered about the brainstem and thalami, consistent with chronic poorly controlled hypertension. MRA HEAD: Normal intracranial MRA. No large vessel occlusion or hemodynamically significant stenosis. MRA NECK: Normal MRA of the neck. Electronically Signed   By: Jeannine Boga M.D.   On: 12/19/2021 22:04   MR ANGIO HEAD WO CONTRAST  Result Date: 12/19/2021 CLINICAL DATA:  Initial evaluation for neuro deficit, stroke suspected. EXAM: MRI HEAD WITHOUT CONTRAST MRA HEAD WITHOUT CONTRAST MRA NECK WITHOUT  CONTRAST TECHNIQUE: Multiplanar, multiecho pulse sequences of the brain and surrounding structures were obtained without intravenous contrast. Angiographic images of the Circle of Willis were obtained using MRA technique without intravenous contrast. Angiographic images of the neck were obtained using MRA technique without intravenous contrast. Carotid stenosis measurements (when applicable) are obtained utilizing NASCET criteria, using the distal internal carotid diameter as the denominator. COMPARISON:  Prior CT from 12/18/2021 as well as recent MRI from 11/10/2021. FINDINGS: MRI HEAD FINDINGS Brain: Cerebral volume stable, and within normal limits for age. Multifocal patchy and confluent T2/FLAIR hyperintensity involving the periventricular and deep white matter both cerebral hemispheres, with additional prominent involvement of the pons, most consistent with chronic small vessel ischemic disease, advanced for age. Multiple superimposed remote lacunar infarcts present about the bilateral basal ganglia, thalami, pons, and cerebellum. 1.9 cm acute ischemic infarct seen involving the deep white matter of the posterior right frontal centrum semi ovale (series 5, image 85). No associated hemorrhage or mass effect. No other evidence for acute or subacute ischemia. Previously seen small infarct involving the right midbrain has resolved. No acute intracranial hemorrhage. Multiple chronic micro hemorrhages noted clustered about the brainstem and thalami, consistent with chronic poorly controlled hypertension. No mass lesion, midline shift or mass effect. No hydrocephalus or extra-axial fluid collection. Pituitary gland and suprasellar region within normal limits. Vascular: Major intracranial vascular flow voids are maintained. Skull and upper cervical spine: Craniocervical junction within normal limits. Postsurgical changes partially visualize within the cervical spine. Bone marrow signal intensity normal. Small lipoma  noted at the right frontal scalp. Scalp soft tissues demonstrate no acute finding. Sinuses/Orbits: Prior ocular lens replacement on the left. Globes and orbital soft tissues demonstrate no acute finding. Scattered mucosal thickening noted about the ethmoidal air cells and sphenoid sinuses. Paranasal sinuses are otherwise clear. No significant mastoid effusion. Other: None. MRA HEAD FINDINGS ANTERIOR CIRCULATION: Both internal carotid arteries patent to the termini without stenosis or other abnormality. A1 segments patent bilaterally. Normal anterior compartment complex. Anterior cerebral arteries widely patent to their distal aspects. No M1 stenosis or occlusion. No proximal MCA branch occlusion. Distal MCA branches perfused and symmetric. POSTERIOR CIRCULATION: Visualized distal V4 segments patent bilaterally. Left vertebral artery dominant. Partially visualized PICA are patent. Basilar patent to its distal aspect without stenosis. Superior cerebellar arteries patent bilaterally. Both PCAs primarily supplied via the basilar well perfused or distal aspects. Small left posterior communicating artery noted. No intracranial aneurysm. MRA NECK FINDINGS AORTIC ARCH: Aortic arch and origin of the great vessels not evaluated or seen on this noncontrast examination. RIGHT CAROTID SYSTEM: Visualized right common and internal carotid arteries are patent without stenosis or evidence for dissection. No significant atheromatous narrowing about the right carotid bulb. LEFT CAROTID SYSTEM: Visualized left common and internal carotid arteries patent without stenosis or evidence for dissection. No significant atheromatous narrowing about the left carotid bulb.  VERTEBRAL ARTERIES: Partially visualized vertebral arteries patent without stenosis or evidence for dissection. Left vertebral artery dominant. Other: None. IMPRESSION: MRI HEAD: 1. 1.9 cm acute ischemic nonhemorrhagic infarct involving the deep white matter of the posterior  right frontal centrum semi ovale. 2. Underlying advanced chronic microvascular ischemic disease with multiple remote lacunar infarcts involving the bilateral basal ganglia, thalami, pons, and cerebellum. 3. Multiple chronic micro hemorrhages clustered about the brainstem and thalami, consistent with chronic poorly controlled hypertension. MRA HEAD: Normal intracranial MRA. No large vessel occlusion or hemodynamically significant stenosis. MRA NECK: Normal MRA of the neck. Electronically Signed   By: Jeannine Boga M.D.   On: 12/19/2021 22:04   MR ANGIO NECK WO CONTRAST  Result Date: 12/19/2021 CLINICAL DATA:  Initial evaluation for neuro deficit, stroke suspected. EXAM: MRI HEAD WITHOUT CONTRAST MRA HEAD WITHOUT CONTRAST MRA NECK WITHOUT CONTRAST TECHNIQUE: Multiplanar, multiecho pulse sequences of the brain and surrounding structures were obtained without intravenous contrast. Angiographic images of the Circle of Willis were obtained using MRA technique without intravenous contrast. Angiographic images of the neck were obtained using MRA technique without intravenous contrast. Carotid stenosis measurements (when applicable) are obtained utilizing NASCET criteria, using the distal internal carotid diameter as the denominator. COMPARISON:  Prior CT from 12/18/2021 as well as recent MRI from 11/10/2021. FINDINGS: MRI HEAD FINDINGS Brain: Cerebral volume stable, and within normal limits for age. Multifocal patchy and confluent T2/FLAIR hyperintensity involving the periventricular and deep white matter both cerebral hemispheres, with additional prominent involvement of the pons, most consistent with chronic small vessel ischemic disease, advanced for age. Multiple superimposed remote lacunar infarcts present about the bilateral basal ganglia, thalami, pons, and cerebellum. 1.9 cm acute ischemic infarct seen involving the deep white matter of the posterior right frontal centrum semi ovale (series 5, image 85).  No associated hemorrhage or mass effect. No other evidence for acute or subacute ischemia. Previously seen small infarct involving the right midbrain has resolved. No acute intracranial hemorrhage. Multiple chronic micro hemorrhages noted clustered about the brainstem and thalami, consistent with chronic poorly controlled hypertension. No mass lesion, midline shift or mass effect. No hydrocephalus or extra-axial fluid collection. Pituitary gland and suprasellar region within normal limits. Vascular: Major intracranial vascular flow voids are maintained. Skull and upper cervical spine: Craniocervical junction within normal limits. Postsurgical changes partially visualize within the cervical spine. Bone marrow signal intensity normal. Small lipoma noted at the right frontal scalp. Scalp soft tissues demonstrate no acute finding. Sinuses/Orbits: Prior ocular lens replacement on the left. Globes and orbital soft tissues demonstrate no acute finding. Scattered mucosal thickening noted about the ethmoidal air cells and sphenoid sinuses. Paranasal sinuses are otherwise clear. No significant mastoid effusion. Other: None. MRA HEAD FINDINGS ANTERIOR CIRCULATION: Both internal carotid arteries patent to the termini without stenosis or other abnormality. A1 segments patent bilaterally. Normal anterior compartment complex. Anterior cerebral arteries widely patent to their distal aspects. No M1 stenosis or occlusion. No proximal MCA branch occlusion. Distal MCA branches perfused and symmetric. POSTERIOR CIRCULATION: Visualized distal V4 segments patent bilaterally. Left vertebral artery dominant. Partially visualized PICA are patent. Basilar patent to its distal aspect without stenosis. Superior cerebellar arteries patent bilaterally. Both PCAs primarily supplied via the basilar well perfused or distal aspects. Small left posterior communicating artery noted. No intracranial aneurysm. MRA NECK FINDINGS AORTIC ARCH: Aortic arch  and origin of the great vessels not evaluated or seen on this noncontrast examination. RIGHT CAROTID SYSTEM: Visualized right common and internal carotid arteries are  patent without stenosis or evidence for dissection. No significant atheromatous narrowing about the right carotid bulb. LEFT CAROTID SYSTEM: Visualized left common and internal carotid arteries patent without stenosis or evidence for dissection. No significant atheromatous narrowing about the left carotid bulb. VERTEBRAL ARTERIES: Partially visualized vertebral arteries patent without stenosis or evidence for dissection. Left vertebral artery dominant. Other: None. IMPRESSION: MRI HEAD: 1. 1.9 cm acute ischemic nonhemorrhagic infarct involving the deep white matter of the posterior right frontal centrum semi ovale. 2. Underlying advanced chronic microvascular ischemic disease with multiple remote lacunar infarcts involving the bilateral basal ganglia, thalami, pons, and cerebellum. 3. Multiple chronic micro hemorrhages clustered about the brainstem and thalami, consistent with chronic poorly controlled hypertension. MRA HEAD: Normal intracranial MRA. No large vessel occlusion or hemodynamically significant stenosis. MRA NECK: Normal MRA of the neck. Electronically Signed   By: Jeannine Boga M.D.   On: 12/19/2021 22:04   CT Head Wo Contrast  Result Date: 12/18/2021 CLINICAL DATA:  Left arm weakness for 2 days, initial encounter EXAM: CT HEAD WITHOUT CONTRAST TECHNIQUE: Contiguous axial images were obtained from the base of the skull through the vertex without intravenous contrast. RADIATION DOSE REDUCTION: This exam was performed according to the departmental dose-optimization program which includes automated exposure control, adjustment of the mA and/or kV according to patient size and/or use of iterative reconstruction technique. COMPARISON:  11/10/2021 FINDINGS: Brain: Scattered hypodensities are noted within the pons and thalami consistent  with prior lacunar infarcts stable from prior MRI. No acute hemorrhage or acute infarction is seen. No space occupying mass lesion is seen. Scattered chronic white matter ischemic changes are also seen. Vascular: No hyperdense vessel or unexpected calcification. Skull: Normal. Negative for fracture or focal lesion. Sinuses/Orbits: No acute finding. Other: None. IMPRESSION: Chronic ischemic changes without acute abnormality. Electronically Signed   By: Inez Catalina M.D.   On: 12/18/2021 21:33        Scheduled Meds:  amLODipine  5 mg Oral Daily   aspirin EC  81 mg Oral Daily   atorvastatin  80 mg Oral QHS   carvedilol  12.5 mg Oral BID WC   DULoxetine  60 mg Oral QHS   fluticasone  2 spray Each Nare Daily   furosemide  40 mg Oral Daily   montelukast  10 mg Oral QHS   pantoprazole  40 mg Oral Daily   ticagrelor  60 mg Oral BID   Continuous Infusions:   LOS: 0 days    Time spent: over 30 min    Fayrene Helper, MD [facility]   To contact the attending provider between 7A-7P or the covering provider during after hours 7P-7A, please log into the web site [email or web address] and access using universal [place] password for that web site. If you do not have the password, please call the hospital operator.  12/20/2021, 8:06 PM

## 2021-12-21 DIAGNOSIS — I6930 Unspecified sequelae of cerebral infarction: Secondary | ICD-10-CM | POA: Diagnosis not present

## 2021-12-21 DIAGNOSIS — Z87898 Personal history of other specified conditions: Secondary | ICD-10-CM | POA: Diagnosis not present

## 2021-12-21 DIAGNOSIS — R531 Weakness: Secondary | ICD-10-CM | POA: Diagnosis not present

## 2021-12-21 DIAGNOSIS — N184 Chronic kidney disease, stage 4 (severe): Secondary | ICD-10-CM | POA: Diagnosis not present

## 2021-12-21 DIAGNOSIS — I639 Cerebral infarction, unspecified: Secondary | ICD-10-CM | POA: Diagnosis not present

## 2021-12-21 LAB — PLATELET INHIBITION P2Y12

## 2021-12-21 MED ORDER — POTASSIUM CHLORIDE CRYS ER 20 MEQ PO TBCR
20.0000 meq | EXTENDED_RELEASE_TABLET | Freq: Once | ORAL | Status: AC
Start: 2021-12-21 — End: 2021-12-21
  Administered 2021-12-21: 20 meq via ORAL
  Filled 2021-12-21: qty 1

## 2021-12-21 MED ORDER — ONDANSETRON 4 MG PO TBDP
4.0000 mg | ORAL_TABLET | Freq: Three times a day (TID) | ORAL | Status: DC | PRN
  Administered 2021-12-21 – 2021-12-22 (×3): 4 mg via ORAL
  Filled 2021-12-21 (×3): qty 1

## 2021-12-21 NOTE — Progress Notes (Signed)
Inpatient Rehab Admissions Coordinator:    I spoke with Pt. Regarding potential CIR. Admit. She has been to CIR previously and is interested in coming now. Reports daughter can provide 24/7 support at d/c. I will open a case with her insurance and pursue for admit.   Clemens Catholic, [place], [place] Admissions Coordinator  [phone] ([place]) [phone] (office)

## 2021-12-21 NOTE — Plan of Care (Signed)

## 2021-12-21 NOTE — Progress Notes (Signed)
Occupational Therapy Treatment Patient Details Name: Christina Orozco MRN: [id number] DOB: 04/21/1971 Today's Date: 12/21/2021   History of present illness 51 y.o. female admitted 7/9 for left sided weakness, MRI revealing acute ischemia, nonhemorrhagic infarct involving deep white matter of posterior R frontal centrum semi ovale. 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 Pt able to perform LUE AROM/AAROM at tabletop level and functional reach tasks, provided pt with handout of functional activities and exercises, pt verbalized understanding. Uncovered 1/3 of pt's R eyeglass lens during task, pt tolerating, reporting reduced diplopia. Pt was not wearing glasses upon entry to room, however states she wears them most of the time during the day. Pt presenting with impairments listed below, will follow. Continue to recommend AIR at d/c.   Recommendations for follow up therapy are one component of a multi-disciplinary discharge planning process, led by the attending physician.  Recommendations may be updated based on patient status, additional functional criteria and insurance authorization.    Follow Up Recommendations  Acute inpatient rehab (3hours/day)    Assistance Recommended at Discharge Intermittent Supervision/Assistance  Patient can return home with the following  Assistance with cooking/housework;Direct supervision/assist for financial management;Direct supervision/assist for medications management;Assist for transportation;A little help with bathing/dressing/bathroom;Help with stairs or ramp for entrance;A little help with walking and/or transfers   Equipment Recommendations  None recommended by OT;Other (comment) (defer to next venue of care)     Recommendations for Other Services PT consult    Precautions / Restrictions Precautions Precautions: Fall Precaution Comments: fall last month Restrictions Weight Bearing Restrictions: No       Mobility Bed Mobility Overal bed mobility: Needs Assistance Bed Mobility: Sidelying to Sit, Sit to Sidelying   Sidelying to sit: Min guard     Sit to sidelying: Min guard General bed mobility comments: HoB elevated and use of bedrail    Transfers Overall transfer level: Needs assistance Equipment used: Rolling walker (2 wheels) Transfers: Sit to/from Stand Sit to Stand: Min guard                 Balance Overall balance assessment: Needs assistance Sitting-balance support: No upper extremity supported, Feet supported Sitting balance-Leahy Scale: Fair Sitting balance - Comments: can perform figure 4 for LB dressing                                   ADL either performed or assessed with clinical judgement   ADL Overall ADL's : Needs assistance/impaired     Grooming: Min guard;Standing Grooming Details (indicate cue type and reason): increased time for fine motor aspects of task and depth perception difficulty aligning toothpaste cap with tube             Lower Body Dressing: Min guard;Sitting/lateral leans Lower Body Dressing Details (indicate cue type and reason): uses bil hands to don socks Toilet Transfer: Min guard;Rolling walker (2 wheels);Ambulation;Regular Glass blower/designer Details (indicate cue type and reason): simulated in room         Functional mobility during ADLs: Min guard;Rolling walker (2 wheels)      Extremity/Trunk Assessment Upper Extremity Assessment Upper Extremity Assessment: LUE deficits/detail LUE Deficits / Details: AAROM, shoulder flexion 80* (prior rotator cuff tear), elbow WFL, wrist  flexion/extension WFL, all movements are slow and controlled. Able to perform digit opposition at decreased speed LUE Sensation:  WNL LUE Coordination: decreased fine motor;decreased gross motor   Lower Extremity Assessment Lower Extremity Assessment: Defer to PT evaluation        Vision   Vision Assessment?: Vision impaired- to be further tested in functional context;Yes Eye Alignment: Impaired (comment) Ocular Range of Motion: Within Functional Limits Alignment/Gaze Preference: Within Defined Limits Tracking/Visual Pursuits: Decreased smoothness of horizontal tracking;Impaired - to be further tested in functional context;Right eye does not track medially Convergence: Impaired (comment) (R eye does not converge) Diplopia Assessment: Objects split on top of one another;Present all the time/all directions (worse with near gaze) Depth Perception: Undershoots Additional Comments: removed 1/3 taping of R lens, pt tolerating, reporting reduced diplopia   Perception Perception Perception: Impaired (L inattention)   Praxis Praxis Praxis: Not tested    Cognition Arousal/Alertness: Awake/alert Behavior During Therapy: WFL for tasks assessed/performed, Flat affect Overall Cognitive Status: Impaired/Different from baseline Area of Impairment: Attention, Memory, Following commands, Awareness                   Current Attention Level: Sustained   Following Commands: Follows one step commands consistently   Awareness: Emergent            Exercises Exercises: Other exercises Other Exercises Other Exercises: LUE functional grasp & release x10 Other Exercises: LUE pronated isolated digit extension x10 Other Exercises: LUE digit opposition x10 Other Exercises: LUE shoulder horizontal ABD/ADD with washcloth on tabletop    Shoulder Instructions       General Comments VSS on RA    Pertinent Vitals/ Pain       Pain Assessment Pain Assessment: Faces Pain Score: 4  Faces Pain Scale: Hurts little more Pain Location: At lateral L elbow and shoulder with ROM. Pain Descriptors / Indicators: Discomfort,  Grimacing Pain Intervention(s): Limited activity within patient's tolerance, Monitored during session, Repositioned  Home Living                                          Prior Functioning/Environment              Frequency  Min 2X/week        Progress Toward Goals  OT Goals(current goals can now be found in the care plan section)  Progress towards OT goals: Progressing toward goals  Acute Rehab OT Goals Patient Stated Goal: none stated OT Goal Formulation: With patient Time For Goal Achievement: 01/03/22 Potential to Achieve Goals: Good ADL Goals Pt Will Perform Grooming: with modified independence;standing Pt Will Perform Upper Body Dressing: with min assist;sitting Pt Will Perform Lower Body Dressing: with min assist;sitting/lateral leans;sit to/from stand Pt Will Transfer to Toilet: with min assist;regular height toilet;ambulating Additional ADL Goal #1: pt will use LUE as functional assist during ADL tasks Additional ADL Goal #2: Pt will independently verbalize occusion taping protocal in preparation for ADLs  Plan Discharge plan remains appropriate;Frequency remains appropriate    Co-evaluation                 AM-PAC OT "6 Clicks" Daily Activity     Outcome Measure   Help from another person eating meals?: A Little Help from another person taking care of personal grooming?: A Little Help from another person toileting, which includes using toliet, bedpan, or urinal?: A Little Help  from another person bathing (including washing, rinsing, drying)?: A Little Help from another person to put on and taking off regular upper body clothing?: A Little Help from another person to put on and taking off regular lower body clothing?: A Little 6 Click Score: 18    End of Session Equipment Utilized During Treatment: Gait belt;Rolling walker (2 wheels)  OT Visit Diagnosis: Other abnormalities of gait and mobility (R26.89);Other symptoms and signs  involving cognitive function;Unsteadiness on feet (R26.81);Muscle weakness (generalized) (M62.81);Low vision, both eyes (H54.2);History of falling (Z91.81);Other symptoms and signs involving the nervous system (R29.898)   Activity Tolerance Patient tolerated treatment well   Patient Left in bed;with call bell/phone within reach;with bed alarm set   Nurse Communication Mobility status        Time: 0979-4997 OT Time Calculation (min): 33 min  Charges: OT General Charges $OT Visit: 1 Visit OT Treatments $Therapeutic Activity: 23-37 mins  Lynnda Child, OTD, OTR/L Acute Rehab [phone]   Kaylyn Lim 12/21/2021, 4:29 PM

## 2021-12-21 NOTE — Progress Notes (Signed)
PROGRESS NOTE    Christina Orozco  DUK:[id number] DOB: 1970/07/11 DOA: 12/18/2021 PCP: Jenel Lucks, PA-C  Chief Complaint  Patient presents with   Weakness    Brief Narrative:   Christina Orozco is Christina Orozco 51 y.o. female with medical history significant of CKD stage IV, asthma, anxiety and depression, T2DM, HTN, GERD, PVD, hx of CVA on DAPT who presented to [facility] ED on 7/9 with complaints of left sided weakness and vision changes. She states when she woke up on Saturday Ams she had weakness in her left arm. She had no tingling or numbness, but did have decreased grip strength. On Sunday AM she had left leg weakness and was dragging her leg. She also had vision changes and couldn't see. She feels like she is having depth perception problems and gets dizzy when she looks to the left. Everything was distorted with some double vision. She has Christina Orozco picture where her left eye is turned outwards. She knew something wasn't right so came to ED. She came to ER and was put in waiting room, but didn't want to stay after waiting 3 hours so left and went to [facility].   Recent hospitalization in June 2023 for possible CVA.   MRI showed possible small right cerebral peduncle infarcts due to small vessel disease. She was discharged with instructions to take Brilinta 90mg  BID for 30 days and then resume ASA and Plavix. She followed up with [facility] neurology outpatient and they switched her from Plavix to Brilinta 60mg  BID and ASA 81mg . Her neurologist does have her on aimovig for migraines as well. She has been compliant with her medications. At her appointment with [facility] Neurology it was noted that she did have an episode of confusion. Her neurologist ordered an EEG outpatient and was considering resuming her keppra if it was abnormal   Denies any fever/chills, + headaches, no palpitations, shortness of breath or cough, + abdominal pain, N/V/D, dysuria or leg swelling. Complains of smelling cigarette smoke and  burning.      She does not smoke or drink alcohol. History of multiple strokes and is on brillinta 60mg  and ASA.    ER Course:  vitals: afebrile, bP: 149/91, HR 82, RR: 18, oxygen: 99%on RA Pertinent labs: hgb: 9.1, potassium: 3.4, BUN: 41, creatinine: 3.12,  CT head: no acute finding  In ED: discussed with Dr. Leonel Ramsay from neurology who recommended getting brain MRI/MRA.     Assessment & Plan:   Principal Problem:   Left-sided weakness and visual deficits  Active Problems:   Hypokalemia   Hypernatremia   History of CVA with residual deficit   CKD (chronic kidney disease) stage 4, GFR 15-29 ml/min (HCC)   History of seizure   Hypertension   Hyperlipidemia   Asthma, mild intermittent   Anemia of chronic disease   Generalized anxiety disorder   Gastroesophageal reflux disease without esophagitis   Stroke (HCC)   Vision abnormalities   Assessment and Plan: * Left-sided weakness and visual deficits  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 who presented with 2-3 day history of left sided weakness and visual deficits MRI brain with 1.9 cm acute ischemic nonhemorrhagic infarct involving the deep white matter of the posterior right frontal centrum semi ovale, advanced chronic microvascular ischemic disease with multiple remote lacunar inarcts involving the bilateral basal ganglia, thalami, pons, and cerebellum, multiple chronic micro hemorrhages clustered about the brainstem and thalami consistent with chronic poorly controlled hypertension MRA head/neck normal intracranial MRA,  normal MRA of the neck LDL 36, a1c 6.3 Neurology c/s, appreciate recs - aspirin, brilinta, statin, pending P2y12 for consideration of adjusting brilinta dose.  May consider outpatient LP?  Hypernatremia Mild, follow  Hypokalemia Continue to follow  History of CVA with residual deficit Continue DAPT Stroke w/u per above  CKD (chronic kidney disease) stage 4, GFR 15-29 ml/min  (HCC) Creatinine baseline around 2.7-3.0, relatively stable Increased today to 3.2, will monitor Continue to monitor  History of seizure Seizure precautions. No longer on keppra.    Hypertension Continue home medication of coreg 12.5mg  BID, norvasc 5mg  daily, hydralzine 50mg  BID and lasix 40mg  daily   Hyperlipidemia LDL 36 Continue high dose statin, lipitor 80mg    Asthma, mild intermittent Stable, continue SABA prn   Anemia of chronic disease Baseline around 10, continue to monitor   Generalized anxiety disorder Continue cymbalta   Gastroesophageal reflux disease without esophagitis Continue protonix      DVT prophylaxis: SCD Code Status: full Family Communication: none Disposition:   Status is: Inpatient Remains inpatient appropriate because: need for continued stroke workup   Consultants:  neurology  Procedures:  none  Antimicrobials:  Anti-infectives (From admission, onward)    None       Subjective: Feeling better  Objective: Vitals:   12/21/21 0402 12/21/21 0736 12/21/21 1119 12/21/21 1519  BP: 137/78 (!) 159/90 (!) 142/90 130/77  Pulse: 85 84 84 100  Resp: 20 18 18 18   Temp: 97.8 F (36.6 C) 98.4 F (36.9 C) 98.3 F (36.8 C) 98.4 F (36.9 C)  TempSrc: Oral Oral    SpO2: 100% 100% 100% 100%  Weight:      Height:        Intake/Output Summary (Last 24 hours) at 12/21/2021 1818 Last data filed at 12/20/2021 2210 Gross per 24 hour  Intake --  Output 59 ml  Net -59 ml   Filed Weights   12/18/21 2108  Weight: 68.5 kg    Examination:  General: No acute distress. Cardiovascular: RRR Lungs: unlabored Abdomen: Soft, nontender, nondistended  Neurological: improved medial movement of R eye, improved L sided weakness Extremities: No clubbing or cyanosis. No edema.  Data Reviewed: I have personally reviewed following labs and imaging studies  CBC: Recent Labs  Lab 12/18/21 1822 12/18/21 1834 12/20/21 0744  WBC 5.4  --  4.3   NEUTROABS 2.7  --  1.5*  HGB 9.1* 9.5* 9.4*  HCT 28.1* 28.0* 28.7*  MCV 90.1  --  88.6  PLT 205  --  700    Basic Metabolic Panel: Recent Labs  Lab 12/18/21 1822 12/18/21 1834 12/19/21 1810 12/20/21 0744  NA 144 146*  --  143  K 3.4* 3.5  --  3.4*  CL 111 111  --  108  CO2 23  --   --  25  GLUCOSE 117* 117*  --  109*  BUN 41* 44*  --  42*  CREATININE 3.12* 3.20*  --  3.20*  CALCIUM 9.2  --   --  9.4  MG  --   --  2.1  --     GFR: Estimated Creatinine Clearance: 19.5 mL/min (Jullia Mulligan) (by C-G formula based on SCr of 3.2 mg/dL (H)).  Liver Function Tests: Recent Labs  Lab 12/18/21 1822 12/20/21 0744  AST 15 17  ALT 15 13  ALKPHOS 46 42  BILITOT 0.5 0.4  PROT 6.8 6.4*  ALBUMIN 3.6 3.5    CBG: Recent Labs  Lab 12/18/21 2117  GLUCAP  105*     No results found for this or any previous visit (from the past 240 hour(s)).       Radiology Studies: MR BRAIN WO CONTRAST  Result Date: 12/19/2021 CLINICAL DATA:  Initial evaluation for neuro deficit, stroke suspected. EXAM: MRI HEAD WITHOUT CONTRAST MRA HEAD WITHOUT CONTRAST MRA NECK WITHOUT CONTRAST TECHNIQUE: Multiplanar, multiecho pulse sequences of the brain and surrounding structures were obtained without intravenous contrast. Angiographic images of the Circle of Willis were obtained using MRA technique without intravenous contrast. Angiographic images of the neck were obtained using MRA technique without intravenous contrast. Carotid stenosis measurements (when applicable) are obtained utilizing NASCET criteria, using the distal internal carotid diameter as the denominator. COMPARISON:  Prior CT from 12/18/2021 as well as recent MRI from 11/10/2021. FINDINGS: MRI HEAD FINDINGS Brain: Cerebral volume stable, and within normal limits for age. Multifocal patchy and confluent T2/FLAIR hyperintensity involving the periventricular and deep white matter both cerebral hemispheres, with additional prominent involvement of the pons,  most consistent with chronic small vessel ischemic disease, advanced for age. Multiple superimposed remote lacunar infarcts present about the bilateral basal ganglia, thalami, pons, and cerebellum. 1.9 cm acute ischemic infarct seen involving the deep white matter of the posterior right frontal centrum semi ovale (series 5, image 85). No associated hemorrhage or mass effect. No other evidence for acute or subacute ischemia. Previously seen small infarct involving the right midbrain has resolved. No acute intracranial hemorrhage. Multiple chronic micro hemorrhages noted clustered about the brainstem and thalami, consistent with chronic poorly controlled hypertension. No mass lesion, midline shift or mass effect. No hydrocephalus or extra-axial fluid collection. Pituitary gland and suprasellar region within normal limits. Vascular: Major intracranial vascular flow voids are maintained. Skull and upper cervical spine: Craniocervical junction within normal limits. Postsurgical changes partially visualize within the cervical spine. Bone marrow signal intensity normal. Small lipoma noted at the right frontal scalp. Scalp soft tissues demonstrate no acute finding. Sinuses/Orbits: Prior ocular lens replacement on the left. Globes and orbital soft tissues demonstrate no acute finding. Scattered mucosal thickening noted about the ethmoidal air cells and sphenoid sinuses. Paranasal sinuses are otherwise clear. No significant mastoid effusion. Other: None. MRA HEAD FINDINGS ANTERIOR CIRCULATION: Both internal carotid arteries patent to the termini without stenosis or other abnormality. A1 segments patent bilaterally. Normal anterior compartment complex. Anterior cerebral arteries widely patent to their distal aspects. No M1 stenosis or occlusion. No proximal MCA branch occlusion. Distal MCA branches perfused and symmetric. POSTERIOR CIRCULATION: Visualized distal V4 segments patent bilaterally. Left vertebral artery dominant.  Partially visualized PICA are patent. Basilar patent to its distal aspect without stenosis. Superior cerebellar arteries patent bilaterally. Both PCAs primarily supplied via the basilar well perfused or distal aspects. Small left posterior communicating artery noted. No intracranial aneurysm. MRA NECK FINDINGS AORTIC ARCH: Aortic arch and origin of the great vessels not evaluated or seen on this noncontrast examination. RIGHT CAROTID SYSTEM: Visualized right common and internal carotid arteries are patent without stenosis or evidence for dissection. No significant atheromatous narrowing about the right carotid bulb. LEFT CAROTID SYSTEM: Visualized left common and internal carotid arteries patent without stenosis or evidence for dissection. No significant atheromatous narrowing about the left carotid bulb. VERTEBRAL ARTERIES: Partially visualized vertebral arteries patent without stenosis or evidence for dissection. Left vertebral artery dominant. Other: None. IMPRESSION: MRI HEAD: 1. 1.9 cm acute ischemic nonhemorrhagic infarct involving the deep white matter of the posterior right frontal centrum semi ovale. 2. Underlying advanced chronic microvascular ischemic disease  with multiple remote lacunar infarcts involving the bilateral basal ganglia, thalami, pons, and cerebellum. 3. Multiple chronic micro hemorrhages clustered about the brainstem and thalami, consistent with chronic poorly controlled hypertension. MRA HEAD: Normal intracranial MRA. No large vessel occlusion or hemodynamically significant stenosis. MRA NECK: Normal MRA of the neck. Electronically Signed   By: Jeannine Boga M.D.   On: 12/19/2021 22:04   MR ANGIO HEAD WO CONTRAST  Result Date: 12/19/2021 CLINICAL DATA:  Initial evaluation for neuro deficit, stroke suspected. EXAM: MRI HEAD WITHOUT CONTRAST MRA HEAD WITHOUT CONTRAST MRA NECK WITHOUT CONTRAST TECHNIQUE: Multiplanar, multiecho pulse sequences of the brain and surrounding structures  were obtained without intravenous contrast. Angiographic images of the Circle of Willis were obtained using MRA technique without intravenous contrast. Angiographic images of the neck were obtained using MRA technique without intravenous contrast. Carotid stenosis measurements (when applicable) are obtained utilizing NASCET criteria, using the distal internal carotid diameter as the denominator. COMPARISON:  Prior CT from 12/18/2021 as well as recent MRI from 11/10/2021. FINDINGS: MRI HEAD FINDINGS Brain: Cerebral volume stable, and within normal limits for age. Multifocal patchy and confluent T2/FLAIR hyperintensity involving the periventricular and deep white matter both cerebral hemispheres, with additional prominent involvement of the pons, most consistent with chronic small vessel ischemic disease, advanced for age. Multiple superimposed remote lacunar infarcts present about the bilateral basal ganglia, thalami, pons, and cerebellum. 1.9 cm acute ischemic infarct seen involving the deep white matter of the posterior right frontal centrum semi ovale (series 5, image 85). No associated hemorrhage or mass effect. No other evidence for acute or subacute ischemia. Previously seen small infarct involving the right midbrain has resolved. No acute intracranial hemorrhage. Multiple chronic micro hemorrhages noted clustered about the brainstem and thalami, consistent with chronic poorly controlled hypertension. No mass lesion, midline shift or mass effect. No hydrocephalus or extra-axial fluid collection. Pituitary gland and suprasellar region within normal limits. Vascular: Major intracranial vascular flow voids are maintained. Skull and upper cervical spine: Craniocervical junction within normal limits. Postsurgical changes partially visualize within the cervical spine. Bone marrow signal intensity normal. Small lipoma noted at the right frontal scalp. Scalp soft tissues demonstrate no acute finding. Sinuses/Orbits:  Prior ocular lens replacement on the left. Globes and orbital soft tissues demonstrate no acute finding. Scattered mucosal thickening noted about the ethmoidal air cells and sphenoid sinuses. Paranasal sinuses are otherwise clear. No significant mastoid effusion. Other: None. MRA HEAD FINDINGS ANTERIOR CIRCULATION: Both internal carotid arteries patent to the termini without stenosis or other abnormality. A1 segments patent bilaterally. Normal anterior compartment complex. Anterior cerebral arteries widely patent to their distal aspects. No M1 stenosis or occlusion. No proximal MCA branch occlusion. Distal MCA branches perfused and symmetric. POSTERIOR CIRCULATION: Visualized distal V4 segments patent bilaterally. Left vertebral artery dominant. Partially visualized PICA are patent. Basilar patent to its distal aspect without stenosis. Superior cerebellar arteries patent bilaterally. Both PCAs primarily supplied via the basilar well perfused or distal aspects. Small left posterior communicating artery noted. No intracranial aneurysm. MRA NECK FINDINGS AORTIC ARCH: Aortic arch and origin of the great vessels not evaluated or seen on this noncontrast examination. RIGHT CAROTID SYSTEM: Visualized right common and internal carotid arteries are patent without stenosis or evidence for dissection. No significant atheromatous narrowing about the right carotid bulb. LEFT CAROTID SYSTEM: Visualized left common and internal carotid arteries patent without stenosis or evidence for dissection. No significant atheromatous narrowing about the left carotid bulb. VERTEBRAL ARTERIES: Partially visualized vertebral arteries patent without  stenosis or evidence for dissection. Left vertebral artery dominant. Other: None. IMPRESSION: MRI HEAD: 1. 1.9 cm acute ischemic nonhemorrhagic infarct involving the deep white matter of the posterior right frontal centrum semi ovale. 2. Underlying advanced chronic microvascular ischemic disease with  multiple remote lacunar infarcts involving the bilateral basal ganglia, thalami, pons, and cerebellum. 3. Multiple chronic micro hemorrhages clustered about the brainstem and thalami, consistent with chronic poorly controlled hypertension. MRA HEAD: Normal intracranial MRA. No large vessel occlusion or hemodynamically significant stenosis. MRA NECK: Normal MRA of the neck. Electronically Signed   By: Jeannine Boga M.D.   On: 12/19/2021 22:04   MR ANGIO NECK WO CONTRAST  Result Date: 12/19/2021 CLINICAL DATA:  Initial evaluation for neuro deficit, stroke suspected. EXAM: MRI HEAD WITHOUT CONTRAST MRA HEAD WITHOUT CONTRAST MRA NECK WITHOUT CONTRAST TECHNIQUE: Multiplanar, multiecho pulse sequences of the brain and surrounding structures were obtained without intravenous contrast. Angiographic images of the Circle of Willis were obtained using MRA technique without intravenous contrast. Angiographic images of the neck were obtained using MRA technique without intravenous contrast. Carotid stenosis measurements (when applicable) are obtained utilizing NASCET criteria, using the distal internal carotid diameter as the denominator. COMPARISON:  Prior CT from 12/18/2021 as well as recent MRI from 11/10/2021. FINDINGS: MRI HEAD FINDINGS Brain: Cerebral volume stable, and within normal limits for age. Multifocal patchy and confluent T2/FLAIR hyperintensity involving the periventricular and deep white matter both cerebral hemispheres, with additional prominent involvement of the pons, most consistent with chronic small vessel ischemic disease, advanced for age. Multiple superimposed remote lacunar infarcts present about the bilateral basal ganglia, thalami, pons, and cerebellum. 1.9 cm acute ischemic infarct seen involving the deep white matter of the posterior right frontal centrum semi ovale (series 5, image 85). No associated hemorrhage or mass effect. No other evidence for acute or subacute ischemia. Previously  seen small infarct involving the right midbrain has resolved. No acute intracranial hemorrhage. Multiple chronic micro hemorrhages noted clustered about the brainstem and thalami, consistent with chronic poorly controlled hypertension. No mass lesion, midline shift or mass effect. No hydrocephalus or extra-axial fluid collection. Pituitary gland and suprasellar region within normal limits. Vascular: Major intracranial vascular flow voids are maintained. Skull and upper cervical spine: Craniocervical junction within normal limits. Postsurgical changes partially visualize within the cervical spine. Bone marrow signal intensity normal. Small lipoma noted at the right frontal scalp. Scalp soft tissues demonstrate no acute finding. Sinuses/Orbits: Prior ocular lens replacement on the left. Globes and orbital soft tissues demonstrate no acute finding. Scattered mucosal thickening noted about the ethmoidal air cells and sphenoid sinuses. Paranasal sinuses are otherwise clear. No significant mastoid effusion. Other: None. MRA HEAD FINDINGS ANTERIOR CIRCULATION: Both internal carotid arteries patent to the termini without stenosis or other abnormality. A1 segments patent bilaterally. Normal anterior compartment complex. Anterior cerebral arteries widely patent to their distal aspects. No M1 stenosis or occlusion. No proximal MCA branch occlusion. Distal MCA branches perfused and symmetric. POSTERIOR CIRCULATION: Visualized distal V4 segments patent bilaterally. Left vertebral artery dominant. Partially visualized PICA are patent. Basilar patent to its distal aspect without stenosis. Superior cerebellar arteries patent bilaterally. Both PCAs primarily supplied via the basilar well perfused or distal aspects. Small left posterior communicating artery noted. No intracranial aneurysm. MRA NECK FINDINGS AORTIC ARCH: Aortic arch and origin of the great vessels not evaluated or seen on this noncontrast examination. RIGHT CAROTID  SYSTEM: Visualized right common and internal carotid arteries are patent without stenosis or evidence for dissection. No  significant atheromatous narrowing about the right carotid bulb. LEFT CAROTID SYSTEM: Visualized left common and internal carotid arteries patent without stenosis or evidence for dissection. No significant atheromatous narrowing about the left carotid bulb. VERTEBRAL ARTERIES: Partially visualized vertebral arteries patent without stenosis or evidence for dissection. Left vertebral artery dominant. Other: None. IMPRESSION: MRI HEAD: 1. 1.9 cm acute ischemic nonhemorrhagic infarct involving the deep white matter of the posterior right frontal centrum semi ovale. 2. Underlying advanced chronic microvascular ischemic disease with multiple remote lacunar infarcts involving the bilateral basal ganglia, thalami, pons, and cerebellum. 3. Multiple chronic micro hemorrhages clustered about the brainstem and thalami, consistent with chronic poorly controlled hypertension. MRA HEAD: Normal intracranial MRA. No large vessel occlusion or hemodynamically significant stenosis. MRA NECK: Normal MRA of the neck. Electronically Signed   By: Jeannine Boga M.D.   On: 12/19/2021 22:04        Scheduled Meds:  amLODipine  5 mg Oral Daily   aspirin EC  81 mg Oral Daily   atorvastatin  80 mg Oral QHS   carvedilol  12.5 mg Oral BID WC   DULoxetine  60 mg Oral QHS   fluticasone  2 spray Each Nare Daily   furosemide  40 mg Oral Daily   montelukast  10 mg Oral QHS   pantoprazole  40 mg Oral Daily   ticagrelor  60 mg Oral BID   Continuous Infusions:   LOS: 1 day    Time spent: over 30 min    Fayrene Helper, MD [facility]   To contact the attending provider between 7A-7P or the covering provider during after hours 7P-7A, please log into the web site [email or web address] and access using universal [place] password for that web site. If you do not have the password, please call the  hospital operator.  12/21/2021, 6:18 PM

## 2021-12-21 NOTE — PMR Pre-admission (Signed)
PMR Admission Coordinator Pre-Admission Assessment  Patient: Christina Orozco is an 51 y.o., female MRN: [id number] DOB: [birth year] Height: 5\' 2"  (157.5 cm) Weight: 68.5 kg  Insurance Information HMO:    PPO:      PCP:      IPA:      80/20:      OTHER: Group [id number] PRIMARY: BCBS [employer] [place]      Policy#: [id number]      Subscriber: Self CM Name: Quentin Ore     Phone#: [phone]     Fax#: [phone] Pre-Cert#: [id number] auth for 7 days from 12/23/21 to 12/30/21       Employer: Not employed Benefits:  Phone #: [phone]     Name: Online verification Eff Date: 12/11/2019 - 02/09/2022 Deductible: $0 - does not have OOP Max: $0 - does not have CIR: 100% coverage SNF: 100% coverage Outpatient: $4 co-pay; limited to 27 visits/cal yr  Home Health: 100% coverage; limited by medical necessity review DME: 100% coverage; limited by medical necessity review  SECONDARY:       Policy#:      Phone#:   Development worker, community:       Phone#:   The Therapist, art Information Summary" for patients in Inpatient Rehabilitation Facilities with attached "Privacy Act [place] Records" was provided and verbally reviewed with: N/A  Emergency Contact Information Contact Information     Name Relation Home Work Mobile   Vankirk,[name] Brother [place] Daughter   [phone]       Current Medical History  Patient Admitting Diagnosis: R CVA  History of Present Illness:  Christina Orozco is a 51 y.o. female with medical history significant of CKD stage IV, asthma, anxiety and depression, T2DM, HTN, GERD, PVD, hx of CVA on DAPT who presented to [facility] ED on 7/10 with complaints of left sided weakness and vision changes. She was also hospitalized in June 2023 for possible CVA. MRI  at that time showed possible small right cerebral peduncle infarcts due to small vessel disease. She was discharged with instructions to take Brilinta 90mg  BID for 30 days and  then resume ASA and Plavix. She followed up with [facility] neurology outpatient and they switched her from Plavix to Brilinta 60mg  BID and ASA 81mg . Vitals and labs In the ED on 7/10 were as follows: bP: 149/91, HR 82, RR: 18, oxygen: 99%on RA  hgb: 9.1, potassium: 3.4, BUN: 41, creatinine: 3.12.  CT of the head revealed no acute finding. MRI of the head revealed .9 cm acute ischemic nonhemorrhagic infarct involving the deep  white matter of the posterior right frontal centrum semi ovale. Pt. Seen by PT and OT and they recommended CIR to assist return to PLOF. Patient is having a renal US today and then will admit to CIR today.  Complete NIHSS TOTAL: 2  Patient's medical record from [facility]  has been reviewed by the rehabilitation admission coordinator and physician.  Past Medical History  Past Medical History:  Diagnosis Date   Anemia    severe   Anxiety    Asthma    CKD (chronic kidney disease)    stage IV   DDD (degenerative disc disease), cervical    Depression    Diabetes mellitus (HCC)    Dislocated shoulder    right   GERD (gastroesophageal reflux disease)    Headache    migraines (about once a month)   History of kidney stones    Hypertension  Neuropathy    Peripheral vascular disease ([place])    Pneumonia    Stroke [facility])     Has the patient had major surgery during 100 days prior to admission? Yes  Family History   family history includes Breast cancer (age of onset: 46) in her sister; CAD in her mother; Cancer in an other family member; Diabetes in an other family member; Heart disease in an other family member; Heart failure in her mother; Seizures in her maternal uncle; Vascular Disease in her mother.  Current Medications  Current Facility-Administered Medications:    acetaminophen (TYLENOL) tablet 650 mg, 650 mg, Oral, Q4H PRN, Wynona Dove A, DO, 650 mg at 12/19/21 2216   albuterol (PROVENTIL) (2.5 MG/3ML) 0.083% nebulizer solution 2.5 mg, 2.5  mg, Inhalation, Q6H PRN, Orma Flaming, MD   amLODipine (NORVASC) tablet 5 mg, 5 mg, Oral, Daily, Roslynn Amble, Ellwood Dense, MD, 5 mg at 12/23/21 1024   aspirin EC tablet 81 mg, 81 mg, Oral, Daily, Orma Flaming, MD, 81 mg at 12/23/21 1024   atorvastatin (LIPITOR) tablet 80 mg, 80 mg, Oral, QHS, Lucrezia Starch, MD, 80 mg at 12/22/21 2102   carvedilol (COREG) tablet 12.5 mg, 12.5 mg, Oral, BID WC, Lucrezia Starch, MD, 12.5 mg at 12/23/21 0803   DULoxetine (CYMBALTA) DR capsule 60 mg, 60 mg, Oral, QHS, Orma Flaming, MD, 60 mg at 12/22/21 2102   fluticasone (FLONASE) 50 MCG/ACT nasal spray 2 spray, 2 spray, Each Nare, Daily, Orma Flaming, MD, 2 spray at 12/23/21 1026   montelukast (SINGULAIR) tablet 10 mg, 10 mg, Oral, QHS, Orma Flaming, MD, 10 mg at 12/22/21 2102   ondansetron (ZOFRAN-ODT) disintegrating tablet 4 mg, 4 mg, Oral, Q8H PRN, Elodia Florence., MD, 4 mg at 12/22/21 0840   pantoprazole (PROTONIX) EC tablet 40 mg, 40 mg, Oral, Daily, Orma Flaming, MD, 40 mg at 12/23/21 1024   polyvinyl alcohol (LIQUIFILM TEARS) 1.4 % ophthalmic solution 1 drop, 1 drop, Both Eyes, Daily PRN, Madueme, Elvira C, RPH   senna-docusate (Senokot-S) tablet 1 tablet, 1 tablet, Oral, QHS PRN, Orma Flaming, MD, 1 tablet at 12/20/21 2143   ticagrelor (BRILINTA) tablet 60 mg, 60 mg, Oral, BID, Orma Flaming, MD, 60 mg at 12/23/21 1024   traZODone (DESYREL) tablet 50 mg, 50 mg, Oral, QHS PRN, Lucrezia Starch, MD, 50 mg at 12/22/21 2106  Patients Current Diet:  Diet Order             Diet Heart Room service appropriate? Yes; Fluid consistency: Thin  Diet effective now                   Precautions / Restrictions Precautions Precautions: Fall Precaution Comments: fall last month Restrictions Weight Bearing Restrictions: No   Has the patient had 2 or more falls or a fall with injury in the past year? No  Prior Activity Level Community (5-7x/wk): Pt. active in the community  PTA  Prior Functional Level Self Care: Did the patient need help bathing, dressing, using the toilet or eating? Independent  Indoor Mobility: Did the patient need assistance with walking from room to room (with or without device)? Independent  Stairs: Did the patient need assistance with internal or external stairs (with or without device)? Independent  Functional Cognition: Did the patient need help planning regular tasks such as shopping or remembering to take medications? Independent  Patient Information Are you of Hispanic, Latino/a,or Spanish origin?: A. No, not of Hispanic, Latino/a, or Spanish origin What is  your race?: B. Black or African American Do you need or want an interpreter to communicate with a doctor or health care staff?: 0. No  Patient's Response To:  Health Literacy and Transportation Is the patient able to respond to health literacy and transportation needs?: Yes Health Literacy - How often do you need to have someone help you when you read instructions, pamphlets, or other written material from your doctor or pharmacy?: Never In the past 12 months, has lack of transportation kept you from medical appointments or from getting medications?: No In the past 12 months, has lack of transportation kept you from meetings, work, or from getting things needed for daily living?: No  Home Assistive Devices / [place] Devices/Equipment: Radio producer (specify quad or straight), Eyeglasses, Grab bars around toilet, Grab bars in shower, Hand-held shower hose, Built-in shower seat (straight cane) Home Equipment: Conservation officer, nature (2 wheels), [employer] - single point, Shower seat, Grab bars - tub/shower, Hand held shower head  Prior Device Use: Indicate devices/aids used by the patient prior to current illness, exacerbation or injury? Walker  Current Functional Level Cognition  Overall Cognitive Status: Impaired/Different from baseline Current Attention Level:  Sustained Orientation Level: Oriented X4 Following Commands: Follows one step commands with increased time General Comments: Pt states no left sided deficits following prior CVA last month, however there was notable loss of Lt sided function based on notes/assessments from prior admission.    Extremity Assessment (includes Sensation/Coordination)  Upper Extremity Assessment: LUE deficits/detail LUE Deficits / Details: AAROM, shoulder flexion 80* (prior rotator cuff tear), elbow WFL, wrist flexion/extension WFL, all movements are slow and controlled. Able to perform digit opposition at decreased speed LUE Sensation: WNL LUE Coordination: decreased fine motor, decreased gross motor  Lower Extremity Assessment: Defer to PT evaluation LLE Deficits / Details: residual deficits from prior CVA, AROM, strength grossly 3/5, apraxic at times with left ankle DF. LLE Sensation: decreased light touch, decreased proprioception LLE Coordination: decreased fine motor, decreased gross motor    ADLs  Overall ADL's : Needs assistance/impaired Grooming: Min guard, Standing Grooming Details (indicate cue type and reason): increased time for fine motor aspects of task and depth perception difficulty aligning toothpaste cap with tube Upper Body Bathing: Sitting, Standing, Moderate assistance Lower Body Bathing: Sitting/lateral leans, Sit to/from stand, Moderate assistance Upper Body Dressing : Minimal assistance, Standing, Sitting Lower Body Dressing: Min guard, Sitting/lateral leans Lower Body Dressing Details (indicate cue type and reason): uses bil hands to don socks Toilet Transfer: Min guard, Rolling walker (2 wheels), Ambulation, Regular Toilet Toilet Transfer Details (indicate cue type and reason): simulated in room Toileting- Clothing Manipulation and Hygiene: Minimal assistance, Sitting/lateral lean, Sit to/from stand Functional mobility during ADLs: Min guard, Rolling walker (2 wheels)    Mobility   Overal bed mobility: Needs Assistance Bed Mobility: Sidelying to Sit, Sit to Sidelying Sidelying to sit: Min guard Sit to sidelying: Min guard General bed mobility comments: Min guard for safety    Transfers  Overall transfer level: Needs assistance Equipment used: 1 person hand held assist Transfers: Sit to/from Stand Sit to Stand: Min assist General transfer comment: Min A for steadying. Performed sit to stand X5 with emphasis on putting weight through LLE for strengthening.    Ambulation / Gait / Stairs / Wheelchair Mobility  Ambulation/Gait Ambulation/Gait assistance: Min assist, Mod assist Gait Distance (Feet): 50 Feet Assistive device: 1 person hand held assist Gait Pattern/deviations: Step-through pattern, Decreased stance time - left, Decreased step length - right,  Ataxic, Drifts right/left, Decreased dorsiflexion - left General Gait Details: Min up to mod A for stability when ambulating without AD. Continued to note functional weakness in LLE, however, ataxia had improved. Pt reporting dizziness, so further mobility deferred. Gait velocity: Decreased Gait velocity interpretation: <1.31 ft/sec, indicative of household ambulator    Posture / Balance Dynamic Sitting Balance Sitting balance - Comments: can perform figure 4 for LB dressing Balance Overall balance assessment: Needs assistance Sitting-balance support: No upper extremity supported, Feet supported Sitting balance-Leahy Scale: Fair Sitting balance - Comments: can perform figure 4 for LB dressing Postural control: Posterior lean Standing balance support: Single extremity supported Standing balance-Leahy Scale: Poor Standing balance comment: Reliant on UE and external support    Special needs/care consideration Skin Intact   Previous Home Environment (from acute therapy documentation) Living Arrangements: Children  Lives With: Spouse Available Help at Discharge: Available PRN/intermittently Type of Home:  Apartment Home Layout: One level Home Access: Level entry Bathroom Shower/Tub: Chiropodist: Standard Bathroom Accessibility: No Home Care Services: Yes Type of Home Care Services: [place] (if known): [place] [facility] Additional Comments: daughter is home during summer from college, and weekends when she is in school  Discharge Living Setting Plans for Discharge Living Setting: Patient's home Type of Home at Discharge: Apartment Discharge Home Layout: One level Discharge Home Access: Level entry Discharge Bathroom Shower/Tub: Tub/shower unit Discharge Bathroom Toilet: Standard Discharge Bathroom Accessibility: Yes How Accessible: Accessible via walker, Accessible via wheelchair Does the patient have any problems obtaining your medications?: No  Social/Family/Support Systems Patient Roles: Other (Comment) Contact Information: [phone] Anticipated Caregiver: Anitraous Gil Ability/Limitations of Caregiver: Min A Caregiver Availability: 24/7  Goals Patient/Family Goal for Rehab: PT OT supervision Expected length of stay: 7-10 days Pt/Family Agrees to Admission and willing to participate: Yes Program Orientation Provided & Reviewed with Pt/Caregiver Including Roles  & Responsibilities: Yes  Decrease burden of Care through IP rehab admission: n/a  Possible need for SNF placement upon discharge: not anticipated   Patient Condition: I have reviewed medical records from [facility] , spoken with CM, and patient. I met with patient at the bedside for inpatient rehabilitation assessment.  Patient will benefit from ongoing PT and OT, can actively participate in 3 hours of therapy a day 5 days of the week, and can make measurable gains during the admission.  Patient will also benefit from the coordinated team approach during an Inpatient Acute Rehabilitation admission.  The patient will receive intensive therapy as well as  Rehabilitation physician, nursing, social worker, and care management interventions.  Due to safety, skin/wound care, disease management, medication administration, pain management, and patient education the patient requires 24 hour a day rehabilitation nursing.  The patient is currently min guard to mod assist with mobility and basic ADLs.  Discharge setting and therapy post discharge at home with home health is anticipated.  Patient has agreed to participate in the Acute Inpatient Rehabilitation Program and will admit today.  Preadmission Screen Completed By:  Retta Diones, 12/21/2021 12:08 PM ______________________________________________________________________   Discussed status with Dr. Ranell Patrick on 12/23/21 at 1000 and received approval for admission today.  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 Yes  Co-Morbidities requiring supervision/potential complications: history of stroke, history of seizure, HTN, type 2 diabetes, stage 4 CKD, anxiety, GERD Due to bladder management, bowel management, safety, skin/wound care, disease management, medication administration, pain management, and patient education, does the patient require 24 hr/day rehab nursing? Yes Does the patient require coordinated care of a physician, rehab nurse, PT, OT, and SLP to address physical and functional deficits in the context of the above medical diagnosis(es)? Yes Addressing deficits in the following areas: balance, endurance, locomotion, strength, transferring, bowel/bladder control, bathing, dressing, feeding, grooming, toileting, and psychosocial support Can the patient actively participate in an intensive therapy program of  at least 3 hrs of therapy 5 days a week? Yes The potential for patient to make measurable gains while on inpatient rehab is excellent Anticipated functional outcomes upon discharge from inpatient rehab: modified independent PT, modified independent OT, modified independent SLP Estimated rehab length of stay to reach the above functional goals is: 7-9 days Anticipated discharge destination: Home 10. Overall Rehab/Functional Prognosis: excellent   MD Signature: Leeroy Cha, MD

## 2021-12-21 NOTE — Progress Notes (Signed)
  Transition of Care [facility]) Screening Note   Patient Details  Name: Christina Orozco Date of Birth: May 27, 1971   Transition of Care [facility]) CM/SW Contact:    Pollie Friar, RN Phone Number: 12/21/2021, 11:28 AM   Pt is from home with her daughter. Recommendations are for CIR.  Transition of Care Department [facility]) has reviewed patient. We will continue to monitor patient advancement through interdisciplinary progression rounds. If new patient transition needs arise, please place a TOC consult.

## 2021-12-21 NOTE — Progress Notes (Signed)
Physical Therapy Treatment Patient Details Name: Christina Orozco MRN: [id number] DOB: 1970/08/13 Today's Date: 12/21/2021   History of Present Illness 51 y.o. female admitted 7/9 for left sided weakness, MRI revealing acute ischemia, nonhemorrhagic infarct involving deep white matter of posterior R frontal centrum semi ovale. PMH: Prior CVA 6/1, hypertension, hyperlipidemia, gout, diabetes, chronic kidney disease, prior strokes, history of seizures possibly secondary to PRES (2019) versus underlying epilepsy    PT Comments    Pt progressing towards goals, however, continues to present with weakness and ataxia in LLE during mobility tasks. Pt with increased nausea so mobility limited to room. Attempted gait without AD and pt requiring up to mod A for steadying. Performed seated LLE HEP at end of session. Current recommendations appropriate. Will continue to follow acutely.    Recommendations for follow up therapy are one component of a multi-disciplinary discharge planning process, led by the attending physician.  Recommendations may be updated based on patient status, additional functional criteria and insurance authorization.  Follow Up Recommendations  Acute inpatient rehab (3hours/day)     Assistance Recommended at Discharge Intermittent Supervision/Assistance  Patient can return home with the following Assistance with cooking/housework;A little help with walking and/or transfers;A little help with bathing/dressing/bathroom;Direct supervision/assist for medications management;Direct supervision/assist for financial management;Assist for transportation;Help with stairs or ramp for entrance   Equipment Recommendations  None recommended by PT    Recommendations for Other Services Rehab consult     Precautions / Restrictions Precautions Precautions: Fall Precaution Comments: fall last month Restrictions Weight Bearing Restrictions: No     Mobility  Bed Mobility Overal bed  mobility: Needs Assistance Bed Mobility: Sidelying to Sit, Sit to Sidelying   Sidelying to sit: Min guard     Sit to sidelying: Min guard General bed mobility comments: Min guard for safety    Transfers Overall transfer level: Needs assistance Equipment used: 1 person hand held assist Transfers: Sit to/from Stand Sit to Stand: Min assist           General transfer comment: Min A for steadying    Ambulation/Gait Ambulation/Gait assistance: Min assist, Mod assist Gait Distance (Feet): 20 Feet Assistive device: 1 person hand held assist Gait Pattern/deviations: Step-through pattern, Decreased stance time - left, Decreased step length - right, Ataxic, Drifts right/left, Decreased dorsiflexion - left Gait velocity: Decreased     General Gait Details: Min up to mod A for stability when ambulating without AD. Ataxia noted in LLE and narrow BOS.   Stairs             Wheelchair Mobility    Modified Rankin (Stroke Patients Only) Modified Rankin (Stroke Patients Only) Pre-Morbid Rankin Score: Slight disability Modified Rankin: Moderately severe disability     Balance Overall balance assessment: Needs assistance Sitting-balance support: No upper extremity supported, Feet supported Sitting balance-Leahy Scale: Fair     Standing balance support: Single extremity supported Standing balance-Leahy Scale: Poor Standing balance comment: Reliant on UE and external support                            Cognition Arousal/Alertness: Awake/alert Behavior During Therapy: WFL for tasks assessed/performed, Flat affect Overall Cognitive Status: Impaired/Different from baseline Area of Impairment: Attention, Following commands, Awareness                   Current Attention Level: Sustained   Following Commands: Follows one step commands with increased time   Awareness: Emergent  Exercises General Exercises - Lower Extremity Long Arc Quad:  AROM, Left, 10 reps Hip Flexion/Marching: AROM, Left, 10 reps, Seated Other Exercises Other Exercises: Performed stepping activities towards target with LLE to focus on slowed controlled movement.    General Comments General comments (skin integrity, edema, etc.): VSS on RA      Pertinent Vitals/Pain Pain Assessment Pain Assessment: No/denies pain    Home Living                          Prior Function            PT Goals (current goals can now be found in the care plan section) Acute Rehab PT Goals Patient Stated Goal: to be independent PT Goal Formulation: With patient Time For Goal Achievement: 12/09/21 Potential to Achieve Goals: Fair Progress towards PT goals: Progressing toward goals    Frequency    Min 4X/week      PT Plan Current plan remains appropriate    Co-evaluation              AM-PAC PT "6 Clicks" Mobility   Outcome Measure  Help needed turning from your back to your side while in a flat bed without using bedrails?: A Little Help needed moving from lying on your back to sitting on the side of a flat bed without using bedrails?: A Little Help needed moving to and from a bed to a chair (including a wheelchair)?: A Little Help needed standing up from a chair using your arms (e.g., wheelchair or bedside chair)?: A Little Help needed to walk in hospital room?: A Lot Help needed climbing 3-5 steps with a railing? : Total 6 Click Score: 15    End of Session Equipment Utilized During Treatment: Gait belt Activity Tolerance: Patient tolerated treatment well Patient left: in bed;with call bell/phone within reach;with bed alarm set Nurse Communication: Mobility status PT Visit Diagnosis: Unsteadiness on feet (R26.81);History of falling (Z91.81);Other symptoms and signs involving the nervous system (R29.898);Ataxic gait (R26.0);Other abnormalities of gait and mobility (R26.89);Apraxia (R48.2);Difficulty in walking, not elsewhere classified  (R26.2);Hemiplegia and hemiparesis Hemiplegia - Right/Left: Left Hemiplegia - dominant/non-dominant: Non-dominant Hemiplegia - caused by: Unspecified     Time: 3893-7342 PT Time Calculation (min) (ACUTE ONLY): 19 min  Charges:  $Gait Training: 8-22 mins                     Reuel Derby, PT, DPT  Acute Rehabilitation Services  Office: [phone]    Rudean Hitt 12/21/2021, 4:33 PM

## 2021-12-21 NOTE — Progress Notes (Addendum)
STROKE TEAM PROGRESS NOTE   INTERVAL HISTORY Patient is alert and oriented. Exam is similar to yesterdays. Right eye can cross midline, still with nystagmus on leftward gaze and double vision on leftward gaze. Left arm and leg 4/5, seems a bit stronger today. No ataxia noted. Waiting on p2y12 lab to  Result.   Vitals:   12/20/21 2004 12/21/21 0110 12/21/21 0402 12/21/21 0736  BP: (!) 143/83 (!) 142/86 137/78 (!) 159/90  Pulse: 79 77 85 84  Resp: _0 Temp: 98.1 F (36.7 C) 98 F (36.7 C) 97.8 F (36.6 C) 98.4 F (36.9 C)  TempSrc: Oral Oral Oral Oral  SpO2: 100% 100% 100% 100%  Weight:      Height:       CBC:  Recent Labs  Lab 12/18/21 1822 12/18/21 1834 12/20/21 0744  WBC 5.4  --  4.3  NEUTROABS 2.7  --  1.5*  HGB 9.1* 9.5* 9.4*  HCT 28.1* 28.0* 28.7*  MCV 90.1  --  88.6  PLT 205  --  193    Basic Metabolic Panel:  Recent Labs  Lab 12/18/21 1822 12/18/21 1834 12/19/21 1810 12/20/21 0744  NA 144 146*  --  143  K 3.4* 3.5  --  3.4*  CL 111 111  --  108  CO2 23  --   --  25  GLUCOSE 117* 117*  --  109*  BUN 41* 44*  --  42*  CREATININE 3.12* 3.20*  --  3.20*  CALCIUM 9.2  --   --  9.4  MG  --   --  2.1  --     Lipid Panel:  Recent Labs  Lab 12/20/21 0116  CHOL 100  TRIG 73  HDL 49  CHOLHDL 2.0  VLDL 15  LDLCALC 36    HgbA1c:  Recent Labs  Lab 12/20/21 0746  HGBA1C 6.3*    Urine Drug Screen: No results for input(s): "LABOPIA", "COCAINSCRNUR", "LABBENZ", "AMPHETMU", "THCU", "LABBARB" in the last 168 hours.  Alcohol Level  Recent Labs  Lab 12/18/21 1822  ETH <10     IMAGING past 24 hours No results found.  PHYSICAL EXAM She is alert and oriented x 3. Speech is clear, no aphasia or dysarthria, no facial droop. PERRL, She has a right INO, with nystagmus on leftward gaze. Visual fields full. Left arm and leg 4/5, right arm and leg 5/5. No ataxia Sensation intact bilaterally  ASSESSMENT/PLAN [name] is a 51 y.o.  female with history of hypertension, hyperlipidemia, diabetes, CKD 4, seizure now off Keppra, several previous lacunar infarcts admitted for left side weakness and vision changes that started on Saturday.   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 DDx including vasculitis but MRA head and neck not consistent with vasculitis. May consider outpt LP with holding brilinta, but likely low yield. CT head Chronic ischemic changes without acute abnormality MRI - 1.9 cm acute ischemic nonhemorrhagic infarct involving the deep white matter of the posterior right frontal centrum semi ovale. Also left pontine punctate restriction signal likely lacunar infarct. Multiple remote lacunar infarcts involving the bilateral basal ganglia, thalami, pons, and cerebellum. Multiple chronic micro hemorrhages clustered about the brainstem and thalami, consistent with chronic poorly controlled hypertension. MRA Head and neck - Normal intracranial MRA Carotid Doppler  6/3 unremarkable    2D Echo 6/2 EF 60-65% ESR and CRP neg LDL 36 HgbA1c 6.3 P2Y12 pending VTE prophylaxis -  SCD's aspirin 81 mg daily and Brilinta 36m BID  prior to admission, now on aspirin 81 mg daily and Brilinta 644mBID . Will consider brilinta dose change based on  P2y12 level Therapy recommendations:  CIR Disposition:  pending  Hx stroke/TIA 08/2015 CSF neg, protein 58 07/2017 MRI showed left pontine infarct, EF 65 to 70%, carotid Doppler and MRA negative.  LDL 43, A1c 12.5, patient put on DAPT and Lipitor 40. 08/2017 Loop recorder placed, no A. fib found 02/27/2019 woke up with complete vision loss, gradually vision resumed OD, still has better vision OS.  MRI at that time showed 3 mm acute to early subacute infarct in the dorsal midline of the pons negative MRI head and neck. 07/2019 loop recorder explanted 05/01/2020 MRI showed new T2 hyperintense lesion of the right frontal white  matter likely reflect an area of late subacute or chronic infarction. 06/19/2020 Subcentimeter likely subacute infarct at the left lateral aspect of the pons.  MRA head unremarkable, carotid Doppler unremarkable, EF 70 to 75%.  LDL 63, A1c 6.2.  Put on aspirin and Brilinta for 1 months and then back to aspirin and the Plavix.  Continue Lipitor 80. 11/10/2021 - MRI showed possible small right cerebral peduncle infarcts.  MRA negative.  Carotid Doppler unremarkable.  EF 60 to 65%, LDL 34, A1c 6.5, discharged with ASA and brilinta 90 twice daily.  Outpatient follow-up with [facility] neurology, continued on aspirin and Brilinta 60 twice daily.  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, discharged with Keppra 03/2018 CSF showed WBC 30, RBC 0, protein 57, glucose 221, infectious work up neg  Off Keppra 05/2021, so far no recurrence Follow-up with [facility] neurology  Hypertension Home meds:  norvasc, coreg, hydralazine Stable Long-term BP goal normotensive  Hyperlipidemia Home meds:  atorvastatin 80 mg, resumed in hospital LDL 36, goal < 70 Continue statin at discharge  Diabetes type II Controlled Home meds:  none HgbA1c 6.3, goal < 7.0 CBGs SSI Close PCP follow up  Other Active Problems CKD stage 4 - Cre 2.61->3.20 Anxiety GERD  Hospital day # 1  DeBeulah GandyNP, ACNPC-AG  ATTENDING NOTE: I reviewed above note and agree with the assessment and plan. Pt was seen and examined.   PT/OT at bedside.  Patient lying in bed, stated that she felt some improvement overnight.  Yesterday she has urinary retention but today and overnight that has resolved.  Her right INO also improved, currently diplopia much improved.  On exam, she had a right INO less prominent, bilateral gaze conjugate, although left gaze incomplete with nystagmus, right gaze complete minimal nystagmus remains.  Her left arm and leg pain much improved from yesterday although not completely gone, she was able to  move left upper and lower extremity equally bilaterally.  PT and OT recommend CIR.  Currently on aspirin and Brilinta 60 twice daily, will consider Brilinta dosing change based on P2 Y12 level.  Continue statin.  Will follow.  For detailed assessment and plan, please refer to above/below as I have made changes wherever appropriate.   JiRosalin HawkingMD PhD Stroke Neurology 12/21/2021 5:17 PM    To contact Stroke Continuity provider, please refer to [email or web address], contact General Neurology

## 2021-12-22 DIAGNOSIS — E7800 Pure hypercholesterolemia, unspecified: Secondary | ICD-10-CM | POA: Diagnosis not present

## 2021-12-22 DIAGNOSIS — I639 Cerebral infarction, unspecified: Secondary | ICD-10-CM | POA: Diagnosis not present

## 2021-12-22 DIAGNOSIS — I6930 Unspecified sequelae of cerebral infarction: Secondary | ICD-10-CM | POA: Diagnosis not present

## 2021-12-22 DIAGNOSIS — R531 Weakness: Secondary | ICD-10-CM | POA: Diagnosis not present

## 2021-12-22 DIAGNOSIS — Z87898 Personal history of other specified conditions: Secondary | ICD-10-CM | POA: Diagnosis not present

## 2021-12-22 DIAGNOSIS — G43009 Migraine without aura, not intractable, without status migrainosus: Secondary | ICD-10-CM

## 2021-12-22 DIAGNOSIS — I10 Essential (primary) hypertension: Secondary | ICD-10-CM

## 2021-12-22 LAB — COMPREHENSIVE METABOLIC PANEL
ALT: 11 U/L (ref 0–44)
AST: 16 U/L (ref 15–41)
Albumin: 3.4 g/dL — ABNORMAL LOW (ref 3.5–5.0)
Alkaline Phosphatase: 39 U/L (ref 38–126)
Anion gap: 10 (ref 5–15)
BUN: 42 mg/dL — ABNORMAL HIGH (ref 6–20)
CO2: 24 mmol/L (ref 22–32)
Calcium: 9.2 mg/dL (ref 8.9–10.3)
Chloride: 107 mmol/L (ref 98–111)
Creatinine, Ser: 3.18 mg/dL — ABNORMAL HIGH (ref 0.44–1.00)
GFR, Estimated: 17 mL/min — ABNORMAL LOW (ref >60)
Glucose, Bld: 88 mg/dL (ref 70–99)
Potassium: 3.6 mmol/L (ref 3.5–5.1)
Sodium: 141 mmol/L (ref 135–145)
Total Bilirubin: 0.5 mg/dL (ref 0.3–1.2)
Total Protein: 6.2 g/dL — ABNORMAL LOW (ref 6.5–8.1)

## 2021-12-22 LAB — CBC WITH DIFFERENTIAL/PLATELET
Abs Immature Granulocytes: 0 10*3/uL (ref 0.00–0.07)
Basophils Absolute: 0 10*3/uL (ref 0.0–0.1)
Basophils Relative: 1 %
Eosinophils Absolute: 0.1 10*3/uL (ref 0.0–0.5)
Eosinophils Relative: 2 %
HCT: 25.9 % — ABNORMAL LOW (ref 36.0–46.0)
Hemoglobin: 8.6 g/dL — ABNORMAL LOW (ref 12.0–15.0)
Immature Granulocytes: 0 %
Lymphocytes Relative: 51 %
Lymphs Abs: 2.7 10*3/uL (ref 0.7–4.0)
MCH: 28.9 pg (ref 26.0–34.0)
MCHC: 33.2 g/dL (ref 30.0–36.0)
MCV: 86.9 fL (ref 80.0–100.0)
Monocytes Absolute: 0.3 10*3/uL (ref 0.1–1.0)
Monocytes Relative: 5 %
Neutro Abs: 2.2 10*3/uL (ref 1.7–7.7)
Neutrophils Relative %: 41 %
Platelets: 198 10*3/uL (ref 150–400)
RBC: 2.98 MIL/uL — ABNORMAL LOW (ref 3.87–5.11)
RDW: 13.2 % (ref 11.5–15.5)
WBC: 5.4 10*3/uL (ref 4.0–10.5)
nRBC: 0 % (ref 0.0–0.2)

## 2021-12-22 LAB — MAGNESIUM: Magnesium: 2 mg/dL (ref 1.7–2.4)

## 2021-12-22 LAB — PHOSPHORUS: Phosphorus: 4.7 mg/dL — ABNORMAL HIGH (ref 2.5–4.6)

## 2021-12-22 NOTE — Progress Notes (Signed)
Physical Therapy Treatment Patient Details Name: Christina Orozco MRN: [id number] DOB: April 15, 1971 Today's Date: 12/22/2021   History of Present Illness 51 y.o. female admitted 7/9 for left sided weakness, MRI revealing acute ischemia, nonhemorrhagic infarct involving deep white matter of posterior R frontal centrum semi ovale. PMH: Prior CVA 6/1, hypertension, hyperlipidemia, gout, diabetes, chronic kidney disease, prior strokes, history of seizures possibly secondary to PRES (2019) versus underlying epilepsy    PT Comments    Pt progressing towards goals. Able to increase ambulation distance, however, reporting dizziness which limited further mobility. Requiring min up to mod A for steadying secondary to unsteadiness. Ataxia seemed to have improved from previous session. Worked on sit<>stand X5 and visual tracking to help with dizziness. Current recommendations for CIR appropriate. Will continue to follow acutely.     Recommendations for follow up therapy are one component of a multi-disciplinary discharge planning process, led by the attending physician.  Recommendations may be updated based on patient status, additional functional criteria and insurance authorization.  Follow Up Recommendations  Acute inpatient rehab (3hours/day)     Assistance Recommended at Discharge Intermittent Supervision/Assistance  Patient can return home with the following Assistance with cooking/housework;A little help with walking and/or transfers;A little help with bathing/dressing/bathroom;Direct supervision/assist for medications management;Direct supervision/assist for financial management;Assist for transportation;Help with stairs or ramp for entrance   Equipment Recommendations  None recommended by PT    Recommendations for Other Services Rehab consult     Precautions / Restrictions Precautions Precautions: Fall Restrictions Weight Bearing Restrictions: No     Mobility  Bed Mobility Overal bed  mobility: Needs Assistance Bed Mobility: Sidelying to Sit, Sit to Sidelying   Sidelying to sit: Min guard     Sit to sidelying: Min guard General bed mobility comments: Min guard for safety    Transfers Overall transfer level: Needs assistance Equipment used: 1 person hand held assist Transfers: Sit to/from Stand Sit to Stand: Min assist           General transfer comment: Min A for steadying. Performed sit to stand X5 with emphasis on putting weight through LLE for strengthening.    Ambulation/Gait Ambulation/Gait assistance: Min assist, Mod assist Gait Distance (Feet): 50 Feet Assistive device: 1 person hand held assist Gait Pattern/deviations: Step-through pattern, Decreased stance time - left, Decreased step length - right, Ataxic, Drifts right/left, Decreased dorsiflexion - left Gait velocity: Decreased     General Gait Details: Min up to mod A for stability when ambulating without AD. Continued to note functional weakness in LLE, however, ataxia had improved. Pt reporting dizziness, so further mobility deferred.   Stairs             Wheelchair Mobility    Modified Rankin (Stroke Patients Only) Modified Rankin (Stroke Patients Only) Pre-Morbid Rankin Score: Slight disability Modified Rankin: Moderately severe disability     Balance Overall balance assessment: Needs assistance Sitting-balance support: No upper extremity supported, Feet supported Sitting balance-Leahy Scale: Fair     Standing balance support: Single extremity supported Standing balance-Leahy Scale: Poor Standing balance comment: Reliant on UE and external support                            Cognition Arousal/Alertness: Awake/alert Behavior During Therapy: WFL for tasks assessed/performed, Flat affect Overall Cognitive Status: Impaired/Different from baseline Area of Impairment: Attention, Following commands, Awareness  Current Attention Level:  Sustained   Following Commands: Follows one step commands with increased time   Awareness: Emergent            Exercises Other Exercises Other Exercises: Worked on visual tracking activities X5 to the L and R.    General Comments        Pertinent Vitals/Pain Pain Assessment Pain Assessment: No/denies pain    Home Living                          Prior Function            PT Goals (current goals can now be found in the care plan section) Acute Rehab PT Goals Patient Stated Goal: to be independent PT Goal Formulation: With patient Time For Goal Achievement: 12/09/21 Potential to Achieve Goals: Fair Progress towards PT goals: Progressing toward goals    Frequency    Min 4X/week      PT Plan Current plan remains appropriate    Co-evaluation              AM-PAC PT "6 Clicks" Mobility   Outcome Measure  Help needed turning from your back to your side while in a flat bed without using bedrails?: A Little Help needed moving from lying on your back to sitting on the side of a flat bed without using bedrails?: A Little Help needed moving to and from a bed to a chair (including a wheelchair)?: A Little Help needed standing up from a chair using your arms (e.g., wheelchair or bedside chair)?: A Little Help needed to walk in hospital room?: A Lot Help needed climbing 3-5 steps with a railing? : Total 6 Click Score: 15    End of Session Equipment Utilized During Treatment: Gait belt Activity Tolerance: Patient tolerated treatment well Patient left: in bed;with call bell/phone within reach;with bed alarm set Nurse Communication: Mobility status PT Visit Diagnosis: Unsteadiness on feet (R26.81);History of falling (Z91.81);Other symptoms and signs involving the nervous system (R29.898);Ataxic gait (R26.0);Other abnormalities of gait and mobility (R26.89);Apraxia (R48.2);Difficulty in walking, not elsewhere classified (R26.2);Hemiplegia and  hemiparesis Hemiplegia - Right/Left: Left Hemiplegia - dominant/non-dominant: Non-dominant Hemiplegia - caused by: Unspecified     Time: 1454-1510 PT Time Calculation (min) (ACUTE ONLY): 16 min  Charges:  $Gait Training: 8-22 mins                     Reuel Derby, PT, DPT  Acute Rehabilitation Services  Office: [phone]    Rudean Hitt 12/22/2021, 3:29 PM

## 2021-12-22 NOTE — Progress Notes (Signed)
PROGRESS NOTE    Christina Orozco  EXB:[id number] DOB: 1970-11-17 DOA: 12/18/2021 PCP: Jenel Lucks, PA-C  Chief Complaint  Patient presents with   Weakness    Brief Narrative:   Christina Orozco is Christina Orozco 51 y.o. female with medical history significant of CKD stage IV, asthma, anxiety and depression, T2DM, HTN, GERD, PVD, hx of CVA on DAPT who presented to [facility] ED on 7/9 with complaints of left sided weakness and vision changes. She states when she woke up on Saturday Ams she had weakness in her left arm. She had no tingling or numbness, but did have decreased grip strength. On Sunday AM she had left leg weakness and was dragging her leg. She also had vision changes and couldn't see. She feels like she is having depth perception problems and gets dizzy when she looks to the left. Everything was distorted with some double vision. She has Raffaela Ladley picture where her left eye is turned outwards. She knew something wasn't right so came to ED. She came to ER and was put in waiting room, but didn't want to stay after waiting 3 hours so left and went to [facility]   Recent hospitalization in June 2023 for possible CVA.   MRI showed possible small right cerebral peduncle infarcts due to small vessel disease. She was discharged with instructions to take Brilinta 90mg  BID for 30 days and then resume ASA and Plavix. She followed up with [facility] neurology outpatient and they switched her from Plavix to Brilinta 60mg  BID and ASA 81mg . Her neurologist does have her on aimovig for migraines as well. She has been compliant with her medications. At her appointment with [facility] it was noted that she did have an episode of confusion. Her neurologist ordered an EEG outpatient and was considering resuming her keppra if it was abnormal   Denies any fever/chills, + headaches, no palpitations, shortness of breath or cough, + abdominal pain, N/V/D, dysuria or leg swelling. Complains of smelling cigarette smoke and  burning.      She does not smoke or drink alcohol. History of multiple strokes and is on brillinta 60mg  and ASA.    ER Course:  vitals: afebrile, bP: 149/91, HR 82, RR: 18, oxygen: 99%on RA Pertinent labs: hgb: 9.1, potassium: 3.4, BUN: 41, creatinine: 3.12,  CT head: no acute finding  In ED: discussed with Dr. Leonel Ramsay from neurology who recommended getting brain MRI/MRA.     Assessment & Plan:   Principal Problem:   Left-sided weakness and visual deficits  Active Problems:   Hypokalemia   Hypernatremia   History of CVA with residual deficit   CKD (chronic kidney disease) stage 4, GFR 15-29 ml/min (HCC)   History of seizure   Hypertension   Hyperlipidemia   Asthma, mild intermittent   Anemia of chronic disease   Generalized anxiety disorder   Gastroesophageal reflux disease without esophagitis   Stroke (HCC)   Vision abnormalities   Assessment and Plan: * Left-sided weakness and visual deficits  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 who presented with 2-3 day history of left sided weakness and visual deficits MRI brain with 1.9 cm acute ischemic nonhemorrhagic infarct involving the deep white matter of the posterior right frontal centrum semi ovale, advanced chronic microvascular ischemic disease with multiple remote lacunar inarcts involving the bilateral basal ganglia, thalami, pons, and cerebellum, multiple chronic micro hemorrhages clustered about the brainstem and thalami consistent with chronic poorly controlled hypertension MRA head/neck normal intracranial MRA,  normal MRA of the neck LDL 36, a1c 6.3 Neurology c/s, appreciate recs - aspirin, brilinta, statin, pending P2y12 for consideration of adjusting brilinta dose.  May consider outpatient LP?  Hypernatremia Mild, follow  Hypokalemia Continue to follow  History of CVA with residual deficit Continue DAPT Stroke w/u per above  CKD (chronic kidney disease) stage 4, GFR 15-29 ml/min  (HCC) Creatinine baseline around 2.7-3.0, relatively stable 3.18 today, follow Continue to monitor  History of seizure Seizure precautions. No longer on keppra.    Hypertension Continue home medication of coreg 12.5mg  BID, norvasc 5mg  daily, hydralzine 50mg  BID and lasix 40mg  daily   Hyperlipidemia LDL 36 Continue high dose statin, lipitor 80mg    Asthma, mild intermittent Stable, continue SABA prn   Anemia of chronic disease Baseline around 10, continue to monitor   Generalized anxiety disorder Continue cymbalta   Gastroesophageal reflux disease without esophagitis Continue protonix      DVT prophylaxis: SCD Code Status: full Family Communication: none Disposition:   Status is: Inpatient Remains inpatient appropriate because: need for continued stroke workup   Consultants:  neurology  Procedures:  none  Antimicrobials:  Anti-infectives (From admission, onward)    None       Subjective: No new complaints  Objective: Vitals:   12/22/21 0400 12/22/21 0710 12/22/21 1214 12/22/21 1559  BP: (!) 142/89 122/75 130/71 (!) 143/80  Pulse: 77 72 73 77  Resp: 18 18 16 16   Temp: 98.4 F (36.9 C) 98.2 F (36.8 C) 98.2 F (36.8 C) 98 F (36.7 C)  TempSrc: Oral Oral Oral Oral  SpO2: 100% 100% 100% 100%  Weight:      Height:        Intake/Output Summary (Last 24 hours) at 12/22/2021 1802 Last data filed at 12/22/2021 1700 Gross per 24 hour  Intake 240 ml  Output 600 ml  Net -360 ml   Filed Weights   12/18/21 2108  Weight: 68.5 kg    Examination:  General: No acute distress. Cardiovascular: RRR Lungs: unlabored Neurological: improved L sided weakness and medial movement of R eye Extremities: No clubbing or cyanosis. No edema.   Data Reviewed: I have personally reviewed following labs and imaging studies  CBC: Recent Labs  Lab 12/18/21 1822 12/18/21 1834 12/20/21 0744 12/22/21 0210  WBC 5.4  --  4.3 5.4  NEUTROABS 2.7  --  1.5* 2.2   HGB 9.1* 9.5* 9.4* 8.6*  HCT 28.1* 28.0* 28.7* 25.9*  MCV 90.1  --  88.6 86.9  PLT 205  --  219 970    Basic Metabolic Panel: Recent Labs  Lab 12/18/21 1822 12/18/21 1834 12/19/21 1810 12/20/21 0744 12/22/21 0210  NA 144 146*  --  143 141  K 3.4* 3.5  --  3.4* 3.6  CL 111 111  --  108 107  CO2 23  --   --  25 24  GLUCOSE 117* 117*  --  109* 88  BUN 41* 44*  --  42* 42*  CREATININE 3.12* 3.20*  --  3.20* 3.18*  CALCIUM 9.2  --   --  9.4 9.2  MG  --   --  2.1  --  2.0  PHOS  --   --   --   --  4.7*    GFR: Estimated Creatinine Clearance: 19.6 mL/min (Christina Orozco) (by C-G formula based on SCr of 3.18 mg/dL (H)).  Liver Function Tests: Recent Labs  Lab 12/18/21 1822 12/20/21 0744 12/22/21 0210  AST 15 17 16  ALT 15 13 11   ALKPHOS 46 42 39  BILITOT 0.5 0.4 0.5  PROT 6.8 6.4* 6.2*  ALBUMIN 3.6 3.5 3.4*    CBG: Recent Labs  Lab 12/18/21 2117  GLUCAP 105*     No results found for this or any previous visit (from the past 240 hour(s)).       Radiology Studies: No results found.      Scheduled Meds:  amLODipine  5 mg Oral Daily   aspirin EC  81 mg Oral Daily   atorvastatin  80 mg Oral QHS   carvedilol  12.5 mg Oral BID WC   DULoxetine  60 mg Oral QHS   fluticasone  2 spray Each Nare Daily   furosemide  40 mg Oral Daily   montelukast  10 mg Oral QHS   pantoprazole  40 mg Oral Daily   ticagrelor  60 mg Oral BID   Continuous Infusions:   LOS: 2 days    Time spent: over 30 min    Christina Helper, MD [facility]   To contact the attending provider between 7A-7P or the covering provider during after hours 7P-7A, please log into the web site [email or web address] and access using universal [place] password for that web site. If you do not have the password, please call the hospital operator.  12/22/2021, 6:02 PM

## 2021-12-22 NOTE — Progress Notes (Signed)
STROKE TEAM PROGRESS NOTE   INTERVAL HISTORY No family is at the bedside. Pt was sleeping but easily arousable. Right INO improving. Pending CIR. P2Y12 still pending.   Vitals:   12/21/21 1951 12/22/21 0400 12/22/21 0710 12/22/21 1214  BP:  (!) 142/89 122/75 130/71  Pulse:  77 72 73  Resp:  '18 18 16  ' Temp:  98.4 F (36.9 C) 98.2 F (36.8 C) 98.2 F (36.8 C)  TempSrc: Oral Oral Oral Oral  SpO2: 100% 100% 100% 100%  Weight:      Height:       CBC:  Recent Labs  Lab 12/20/21 0744 12/22/21 0210  WBC 4.3 5.4  NEUTROABS 1.5* 2.2  HGB 9.4* 8.6*  HCT 28.7* 25.9*  MCV 88.6 86.9  PLT 219 494   Basic Metabolic Panel:  Recent Labs  Lab 12/19/21 1810 12/20/21 0744 12/22/21 0210  NA  --  143 141  K  --  3.4* 3.6  CL  --  108 107  CO2  --  25 24  GLUCOSE  --  109* 88  BUN  --  42* 42*  CREATININE  --  3.20* 3.18*  CALCIUM  --  9.4 9.2  MG 2.1  --  2.0  PHOS  --   --  4.7*   Lipid Panel:  Recent Labs  Lab 12/20/21 0116  CHOL 100  TRIG 73  HDL 49  CHOLHDL 2.0  VLDL 15  LDLCALC 36   HgbA1c:  Recent Labs  Lab 12/20/21 0746  HGBA1C 6.3*   Urine Drug Screen: No results for input(s): "LABOPIA", "COCAINSCRNUR", "LABBENZ", "AMPHETMU", "THCU", "LABBARB" in the last 168 hours.  Alcohol Level  Recent Labs  Lab 12/18/21 1822  ETH <10    IMAGING past 24 hours No results found.  PHYSICAL EXAM  Temp:  [98.2 F (36.8 C)-98.4 F (36.9 C)] 98.2 F (36.8 C) (07/13 1214) Pulse Rate:  [72-77] 73 (07/13 1214) Resp:  [16-18] 16 (07/13 1214) BP: (122-154)/(71-89) 130/71 (07/13 1214) SpO2:  [100 %] 100 % (07/13 1214)  General - Well nourished, well developed, in no apparent distress.  Ophthalmologic - fundi not visualized due to noncooperation.  Cardiovascular - Regular rhythm and rate.  Neuro - awake alert orientated x3, no aphasia, follows simple commands.  Still has mild diplopia with right INO which has much improved,  left gaze incomplete with nystagmus, right  gaze complete minimal nystagmus remains. Moving all extremities equally, no pain.  Sensation symmetrical, bilateral finger-to-nose no ataxia. Gait not tested   ASSESSMENT/PLAN [name] is a 51 y.o. female with history of hypertension, hyperlipidemia, diabetes, CKD 4, seizure now off Keppra, several previous lacunar infarcts admitted for left side weakness and vision changes that started on Saturday.   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 DDx including vasculitis but MRA head and neck not consistent with vasculitis. May consider outpt LP with holding brilinta, but likely low yield. CT head Chronic ischemic changes without acute abnormality MRI - 1.9 cm acute ischemic nonhemorrhagic infarct involving the deep white matter of the posterior right frontal centrum semi ovale. Also left pontine punctate restriction signal likely lacunar infarct. Multiple remote lacunar infarcts involving the bilateral basal ganglia, thalami, pons, and cerebellum. Multiple chronic micro hemorrhages clustered about the brainstem and thalami, consistent with chronic poorly controlled hypertension. MRA Head and neck - Normal intracranial MRA Carotid Doppler  6/3 unremarkable    2D Echo 6/2 EF 60-65%  ESR and CRP neg LDL 36 HgbA1c 6.3 P2Y12 pending VTE prophylaxis - SCD's aspirin 81 mg daily and Brilinta 70m BID  prior to admission, now on aspirin 81 mg daily and Brilinta 648mBID . Will follow up with P2Y12 level, and will consider brilinta dose change if needed based on  P2y12 level Therapy recommendations:  CIR Disposition:  pending  Hx stroke/TIA 08/2015 CSF neg, protein 58 07/2017 MRI showed left pontine infarct, EF 65 to 70%, carotid Doppler and MRA negative.  LDL 43, A1c 12.5, patient put on DAPT and Lipitor 40. 08/2017 Loop recorder placed, no A. fib found 02/27/2019 woke up with complete vision loss, gradually vision resumed OD,  still has better vision OS.  MRI at that time showed 3 mm acute to early subacute infarct in the dorsal midline of the pons negative MRI head and neck. 07/2019 loop recorder explanted 05/01/2020 MRI showed new T2 hyperintense lesion of the right frontal white matter likely reflect an area of late subacute or chronic infarction. 06/19/2020 Subcentimeter likely subacute infarct at the left lateral aspect of the pons.  MRA head unremarkable, carotid Doppler unremarkable, EF 70 to 75%.  LDL 63, A1c 6.2.  Put on aspirin and Brilinta for 1 months and then back to aspirin and the Plavix.  Continue Lipitor 80. 11/10/2021 - MRI showed possible small right cerebral peduncle infarcts.  MRA negative.  Carotid Doppler unremarkable.  EF 60 to 65%, LDL 34, A1c 6.5, discharged with ASA and brilinta 90 twice daily.  Outpatient follow-up with [facility], continued on aspirin and Brilinta 60 twice daily.  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 Please call with questions. Pt will follow up with [facility] follow up on 01/25/22. Thanks for the consult.   JiRosalin HawkingMD PhD Stroke Neurology 12/22/2021 3:17 PM    To contact Stroke Continuity provider, please refer to [email or web address], contact General Neurology

## 2021-12-22 NOTE — TOC CAGE-AID Note (Signed)
Transition of Care [facility]) - CAGE-AID Screening   Patient Details  Name: Christina Orozco MRN: [id number] Date of Birth: 19-May-1971  Transition of Care [facility]) CM/SW Contact:    Coralee [place], [place] Phone Number: 12/22/2021, 1:49 PM   Clinical Narrative: CSW met with pt at bedside to complete CAGE-AID assessment. Pt answered no to all assessment questions, verifies no history of substance use.   CAGE-AID Screening:    Have You Ever Felt You Ought to Cut Down on Your Drinking or Drug Use?: No Have People Annoyed You By Critizing Your Drinking Or Drug Use?: No Have You Felt Bad Or Guilty About Your Drinking Or Drug Use?: No Have You Ever Had a Drink or Used Drugs First Thing In The Morning to Steady Your Nerves or to Get Rid of a Hangover?: No CAGE-AID Score: 0  Substance Abuse Education Offered: No

## 2021-12-22 NOTE — Progress Notes (Signed)
IP rehab admissions - Awaiting call back from insurance case manager regarding potential inpatient rehab admission.  Will update all once I hear back from insurance.  Call for questions.  [phone]

## 2021-12-23 ENCOUNTER — Inpatient Hospital Stay (HOSPITAL_COMMUNITY)
Admission: RE | Admit: 2021-12-23 | Discharge: 2021-12-30 | DRG: 057 | Disposition: A | Discharge status: Home / Self Care | Payer: Medicare Other | Source: Intra-hospital | Attending: Physical Medicine & Rehabilitation | Admitting: Physical Medicine & Rehabilitation

## 2021-12-23 ENCOUNTER — Other Ambulatory Visit: Payer: Self-pay

## 2021-12-23 ENCOUNTER — Inpatient Hospital Stay (HOSPITAL_COMMUNITY): Payer: Medicare Other

## 2021-12-23 ENCOUNTER — Encounter (HOSPITAL_COMMUNITY): Payer: Self-pay | Admitting: Physical Medicine & Rehabilitation

## 2021-12-23 DIAGNOSIS — H5121 Internuclear ophthalmoplegia, right eye: Secondary | ICD-10-CM | POA: Diagnosis not present

## 2021-12-23 DIAGNOSIS — E669 Obesity, unspecified: Secondary | ICD-10-CM | POA: Diagnosis not present

## 2021-12-23 DIAGNOSIS — Z880 Allergy status to penicillin: Secondary | ICD-10-CM | POA: Diagnosis not present

## 2021-12-23 DIAGNOSIS — K219 Gastro-esophageal reflux disease without esophagitis: Secondary | ICD-10-CM | POA: Diagnosis present

## 2021-12-23 DIAGNOSIS — I69354 Hemiplegia and hemiparesis following cerebral infarction affecting left non-dominant side: Secondary | ICD-10-CM | POA: Diagnosis present

## 2021-12-23 DIAGNOSIS — I639 Cerebral infarction, unspecified: Secondary | ICD-10-CM | POA: Diagnosis present

## 2021-12-23 DIAGNOSIS — I10 Essential (primary) hypertension: Secondary | ICD-10-CM | POA: Diagnosis present

## 2021-12-23 DIAGNOSIS — F32A Depression, unspecified: Secondary | ICD-10-CM | POA: Diagnosis present

## 2021-12-23 DIAGNOSIS — G40909 Epilepsy, unspecified, not intractable, without status epilepticus: Secondary | ICD-10-CM | POA: Diagnosis present

## 2021-12-23 DIAGNOSIS — Z7982 Long term (current) use of aspirin: Secondary | ICD-10-CM

## 2021-12-23 DIAGNOSIS — Z20822 Contact with and (suspected) exposure to covid-19: Secondary | ICD-10-CM | POA: Diagnosis present

## 2021-12-23 DIAGNOSIS — Z87898 Personal history of other specified conditions: Secondary | ICD-10-CM

## 2021-12-23 DIAGNOSIS — E876 Hypokalemia: Secondary | ICD-10-CM | POA: Diagnosis present

## 2021-12-23 DIAGNOSIS — I129 Hypertensive chronic kidney disease with stage 1 through stage 4 chronic kidney disease, or unspecified chronic kidney disease: Secondary | ICD-10-CM | POA: Diagnosis present

## 2021-12-23 DIAGNOSIS — E1122 Type 2 diabetes mellitus with diabetic chronic kidney disease: Secondary | ICD-10-CM | POA: Diagnosis present

## 2021-12-23 DIAGNOSIS — I69398 Other sequelae of cerebral infarction: Secondary | ICD-10-CM | POA: Diagnosis not present

## 2021-12-23 DIAGNOSIS — Z833 Family history of diabetes mellitus: Secondary | ICD-10-CM

## 2021-12-23 DIAGNOSIS — Z8249 Family history of ischemic heart disease and other diseases of the circulatory system: Secondary | ICD-10-CM

## 2021-12-23 DIAGNOSIS — H539 Unspecified visual disturbance: Secondary | ICD-10-CM | POA: Diagnosis present

## 2021-12-23 DIAGNOSIS — D631 Anemia in chronic kidney disease: Secondary | ICD-10-CM | POA: Diagnosis present

## 2021-12-23 DIAGNOSIS — F411 Generalized anxiety disorder: Secondary | ICD-10-CM | POA: Diagnosis present

## 2021-12-23 DIAGNOSIS — Z7902 Long term (current) use of antithrombotics/antiplatelets: Secondary | ICD-10-CM

## 2021-12-23 DIAGNOSIS — D638 Anemia in other chronic diseases classified elsewhere: Secondary | ICD-10-CM

## 2021-12-23 DIAGNOSIS — I152 Hypertension secondary to endocrine disorders: Secondary | ICD-10-CM | POA: Diagnosis present

## 2021-12-23 DIAGNOSIS — Z888 Allergy status to other drugs, medicaments and biological substances status: Secondary | ICD-10-CM

## 2021-12-23 DIAGNOSIS — R531 Weakness: Secondary | ICD-10-CM

## 2021-12-23 DIAGNOSIS — J45909 Unspecified asthma, uncomplicated: Secondary | ICD-10-CM | POA: Diagnosis present

## 2021-12-23 DIAGNOSIS — E785 Hyperlipidemia, unspecified: Secondary | ICD-10-CM | POA: Diagnosis present

## 2021-12-23 DIAGNOSIS — K5900 Constipation, unspecified: Secondary | ICD-10-CM | POA: Diagnosis not present

## 2021-12-23 DIAGNOSIS — N184 Chronic kidney disease, stage 4 (severe): Secondary | ICD-10-CM

## 2021-12-23 DIAGNOSIS — G43909 Migraine, unspecified, not intractable, without status migrainosus: Secondary | ICD-10-CM | POA: Diagnosis present

## 2021-12-23 DIAGNOSIS — E1151 Type 2 diabetes mellitus with diabetic peripheral angiopathy without gangrene: Secondary | ICD-10-CM | POA: Diagnosis present

## 2021-12-23 DIAGNOSIS — E1159 Type 2 diabetes mellitus with other circulatory complications: Secondary | ICD-10-CM | POA: Diagnosis present

## 2021-12-23 DIAGNOSIS — N179 Acute kidney failure, unspecified: Secondary | ICD-10-CM

## 2021-12-23 DIAGNOSIS — Z79899 Other long term (current) drug therapy: Secondary | ICD-10-CM

## 2021-12-23 DIAGNOSIS — Z803 Family history of malignant neoplasm of breast: Secondary | ICD-10-CM

## 2021-12-23 LAB — URINALYSIS, ROUTINE W REFLEX MICROSCOPIC
Bilirubin Urine: NEGATIVE
Glucose, UA: NEGATIVE mg/dL
Hgb urine dipstick: NEGATIVE
Ketones, ur: NEGATIVE mg/dL
Nitrite: NEGATIVE
Protein, ur: NEGATIVE mg/dL
Specific Gravity, Urine: 1.01 (ref 1.005–1.030)
pH: 5 (ref 5.0–8.0)

## 2021-12-23 LAB — CBC WITH DIFFERENTIAL/PLATELET
Abs Immature Granulocytes: 0 10*3/uL (ref 0.00–0.07)
Basophils Absolute: 0 10*3/uL (ref 0.0–0.1)
Basophils Relative: 1 %
Eosinophils Absolute: 0.1 10*3/uL (ref 0.0–0.5)
Eosinophils Relative: 2 %
HCT: 25.6 % — ABNORMAL LOW (ref 36.0–46.0)
Hemoglobin: 8.5 g/dL — ABNORMAL LOW (ref 12.0–15.0)
Immature Granulocytes: 0 %
Lymphocytes Relative: 54 %
Lymphs Abs: 2.8 10*3/uL (ref 0.7–4.0)
MCH: 29.3 pg (ref 26.0–34.0)
MCHC: 33.2 g/dL (ref 30.0–36.0)
MCV: 88.3 fL (ref 80.0–100.0)
Monocytes Absolute: 0.3 10*3/uL (ref 0.1–1.0)
Monocytes Relative: 6 %
Neutro Abs: 1.9 10*3/uL (ref 1.7–7.7)
Neutrophils Relative %: 37 %
Platelets: 182 10*3/uL (ref 150–400)
RBC: 2.9 MIL/uL — ABNORMAL LOW (ref 3.87–5.11)
RDW: 13.2 % (ref 11.5–15.5)
WBC: 5.1 10*3/uL (ref 4.0–10.5)
nRBC: 0 % (ref 0.0–0.2)

## 2021-12-23 LAB — COMPREHENSIVE METABOLIC PANEL
ALT: 15 U/L (ref 0–44)
AST: 15 U/L (ref 15–41)
Albumin: 3.3 g/dL — ABNORMAL LOW (ref 3.5–5.0)
Alkaline Phosphatase: 43 U/L (ref 38–126)
Anion gap: 7 (ref 5–15)
BUN: 44 mg/dL — ABNORMAL HIGH (ref 6–20)
CO2: 27 mmol/L (ref 22–32)
Calcium: 9.1 mg/dL (ref 8.9–10.3)
Chloride: 107 mmol/L (ref 98–111)
Creatinine, Ser: 3.72 mg/dL — ABNORMAL HIGH (ref 0.44–1.00)
GFR, Estimated: 14 mL/min — ABNORMAL LOW (ref >60)
Glucose, Bld: 101 mg/dL — ABNORMAL HIGH (ref 70–99)
Potassium: 3.3 mmol/L — ABNORMAL LOW (ref 3.5–5.1)
Sodium: 141 mmol/L (ref 135–145)
Total Bilirubin: 0.5 mg/dL (ref 0.3–1.2)
Total Protein: 6.3 g/dL — ABNORMAL LOW (ref 6.5–8.1)

## 2021-12-23 LAB — MAGNESIUM: Magnesium: 2.1 mg/dL (ref 1.7–2.4)

## 2021-12-23 LAB — PHOSPHORUS: Phosphorus: 5.1 mg/dL — ABNORMAL HIGH (ref 2.5–4.6)

## 2021-12-23 MED ORDER — ALBUTEROL SULFATE (2.5 MG/3ML) 0.083% IN NEBU: 2.5000 mg | INHALATION_SOLUTION | Freq: Four times a day (QID) | RESPIRATORY_TRACT | Status: DC | PRN

## 2021-12-23 MED ORDER — PANTOPRAZOLE SODIUM 40 MG PO TBEC
40.0000 mg | DELAYED_RELEASE_TABLET | Freq: Every day | ORAL | Status: DC
  Administered 2021-12-24 – 2021-12-30 (×7): 40 mg via ORAL
  Filled 2021-12-23 (×7): qty 1

## 2021-12-23 MED ORDER — AMLODIPINE BESYLATE 5 MG PO TABS
5.0000 mg | ORAL_TABLET | Freq: Every day | ORAL | Status: DC
  Administered 2021-12-24 – 2021-12-30 (×7): 5 mg via ORAL
  Filled 2021-12-23 (×7): qty 1

## 2021-12-23 MED ORDER — PROCHLORPERAZINE 25 MG RE SUPP: 12.5000 mg | Freq: Four times a day (QID) | RECTAL | Status: DC | PRN

## 2021-12-23 MED ORDER — TRAZODONE HCL 50 MG PO TABS
25.0000 mg | ORAL_TABLET | Freq: Every evening | ORAL | Status: DC | PRN
  Administered 2021-12-23 – 2021-12-29 (×7): 50 mg via ORAL
  Filled 2021-12-23 (×7): qty 1

## 2021-12-23 MED ORDER — POTASSIUM CHLORIDE CRYS ER 20 MEQ PO TBCR
40.0000 meq | EXTENDED_RELEASE_TABLET | Freq: Once | ORAL | Status: AC
  Administered 2021-12-23: 40 meq via ORAL
  Filled 2021-12-23: qty 2

## 2021-12-23 MED ORDER — ASPIRIN 81 MG PO TBEC
81.0000 mg | DELAYED_RELEASE_TABLET | Freq: Every day | ORAL | Status: DC
  Administered 2021-12-24 – 2021-12-30 (×7): 81 mg via ORAL
  Filled 2021-12-23 (×7): qty 1

## 2021-12-23 MED ORDER — POLYVINYL ALCOHOL 1.4 % OP SOLN
1.0000 [drp] | Freq: Every day | OPHTHALMIC | Status: DC | PRN
Start: 2021-12-23 — End: 2021-12-30

## 2021-12-23 MED ORDER — ENOXAPARIN SODIUM 30 MG/0.3ML IJ SOSY
30.0000 mg | PREFILLED_SYRINGE | INTRAMUSCULAR | Status: DC
  Administered 2021-12-23 – 2021-12-29 (×7): 30 mg via SUBCUTANEOUS
  Filled 2021-12-23 (×7): qty 0.3

## 2021-12-23 MED ORDER — TICAGRELOR 60 MG PO TABS
60.0000 mg | ORAL_TABLET | Freq: Two times a day (BID) | ORAL | Status: DC
  Administered 2021-12-23 – 2021-12-30 (×14): 60 mg via ORAL
  Filled 2021-12-23 (×16): qty 1

## 2021-12-23 MED ORDER — PROCHLORPERAZINE EDISYLATE 10 MG/2ML IJ SOLN: 5.0000 mg | Freq: Four times a day (QID) | INTRAMUSCULAR | Status: DC | PRN

## 2021-12-23 MED ORDER — ALUM & MAG HYDROXIDE-SIMETH 200-200-20 MG/5ML PO SUSP: 30.0000 mL | ORAL | Status: DC | PRN

## 2021-12-23 MED ORDER — DIPHENHYDRAMINE HCL 12.5 MG/5ML PO ELIX: 12.5000 mg | ORAL_SOLUTION | Freq: Four times a day (QID) | ORAL | Status: DC | PRN

## 2021-12-23 MED ORDER — MAGNESIUM HYDROXIDE 400 MG/5ML PO SUSP: 30.0000 mL | Freq: Every day | ORAL | Status: DC | PRN

## 2021-12-23 MED ORDER — PROCHLORPERAZINE MALEATE 5 MG PO TABS: 5.0000 mg | ORAL_TABLET | Freq: Four times a day (QID) | ORAL | Status: DC | PRN

## 2021-12-23 MED ORDER — ATORVASTATIN CALCIUM 80 MG PO TABS
80.0000 mg | ORAL_TABLET | Freq: Every day | ORAL | Status: DC
  Administered 2021-12-23 – 2021-12-29 (×7): 80 mg via ORAL
  Filled 2021-12-23 (×7): qty 1

## 2021-12-23 MED ORDER — FLEET ENEMA 7-19 GM/118ML RE ENEM: 1.0000 | ENEMA | Freq: Once | RECTAL | Status: DC | PRN

## 2021-12-23 MED ORDER — METHOCARBAMOL 500 MG PO TABS
500.0000 mg | ORAL_TABLET | Freq: Four times a day (QID) | ORAL | Status: DC | PRN
  Administered 2021-12-25 – 2021-12-30 (×7): 500 mg via ORAL
  Filled 2021-12-23 (×7): qty 1

## 2021-12-23 MED ORDER — MONTELUKAST SODIUM 10 MG PO TABS
10.0000 mg | ORAL_TABLET | Freq: Every day | ORAL | Status: DC
  Administered 2021-12-23 – 2021-12-29 (×7): 10 mg via ORAL
  Filled 2021-12-23 (×7): qty 1

## 2021-12-23 MED ORDER — DULOXETINE HCL 30 MG PO CPEP
60.0000 mg | ORAL_CAPSULE | Freq: Every day | ORAL | Status: DC
  Administered 2021-12-23 – 2021-12-29 (×7): 60 mg via ORAL
  Filled 2021-12-23 (×7): qty 2

## 2021-12-23 MED ORDER — ACETAMINOPHEN 325 MG PO TABS
325.0000 mg | ORAL_TABLET | ORAL | Status: DC | PRN
  Administered 2021-12-24 – 2021-12-30 (×8): 650 mg via ORAL
  Filled 2021-12-23 (×8): qty 2

## 2021-12-23 MED ORDER — GUAIFENESIN-DM 100-10 MG/5ML PO SYRP: 5.0000 mL | ORAL_SOLUTION | Freq: Four times a day (QID) | ORAL | Status: DC | PRN

## 2021-12-23 MED ORDER — CARVEDILOL 12.5 MG PO TABS
12.5000 mg | ORAL_TABLET | Freq: Two times a day (BID) | ORAL | Status: DC
Start: 2021-12-23 — End: 2021-12-30
  Administered 2021-12-23 – 2021-12-30 (×14): 12.5 mg via ORAL
  Filled 2021-12-23 (×15): qty 1

## 2021-12-23 MED ORDER — BISACODYL 5 MG PO TBEC: 5.0000 mg | DELAYED_RELEASE_TABLET | Freq: Every day | ORAL | Status: DC | PRN

## 2021-12-23 MED ORDER — FLUTICASONE PROPIONATE 50 MCG/ACT NA SUSP
2.0000 | Freq: Every day | NASAL | Status: DC
Start: 2021-12-24 — End: 2021-12-30
  Administered 2021-12-24 – 2021-12-30 (×7): 2 via NASAL
  Filled 2021-12-23: qty 16

## 2021-12-23 NOTE — Progress Notes (Signed)
IP rehab admissions - I have insurance approval for acute inpatient rehab admission.  I have reached out to attending to see if patient can admit to CIR today.  Noted patient to have an [name] today and then may be able to admit to CIR.  Please call me for questions.  [phone]

## 2021-12-23 NOTE — Progress Notes (Signed)
Inpatient Rehabilitation Admission Medication Review by a Pharmacist   A complete drug regimen review was completed for this patient to identify any potential clinically significant medication issues.   High Risk Drug Classes Is patient taking? Indication by Medication  Antipsychotic No    Anticoagulant No    Antibiotic No    Opioid No    Antiplatelet Yes ASA, brilinta- DAPT  Hypoglycemics/insulin No   Vasoactive Medication Yes Amlodipine, hydralazine, coreg, lasix- bp  Chemotherapy No    Other Yes Lipitor-hyperlipidemia Cymbalta-GAD Singulair-asthma Protonix-GERD Zofran-nausea Trazodone-sleep aid Vitamin D3 - vitamin/supplement        Type of Medication Issue Identified Description of Issue Recommendation(s)  Drug Interaction(s) (clinically significant)        Duplicate Therapy        Allergy        No Medication Administration End Date        Incorrect Dose        Additional Drug Therapy Needed        Significant med changes from prior encounter (inform family/care partners about these prior to discharge).      Other            Clinically significant medication issues were identified that warrant physician communication and completion of prescribed/recommended actions by midnight of the next day:  No   Name of provider notified for urgent issues identified:    Provider Method of Notification:        Pharmacist comments:    Time spent performing this drug regimen review (minutes):  10 minutes  Antonietta Jewel, PharmD, BCCCP Clinical Pharmacist  Phone: [phone] 12/23/2021 5:22 PM  Please check AMION for all [place] phone numbers After 10:00 PM, call [place] [phone]

## 2021-12-23 NOTE — H&P (Signed)
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  She reported decreased vision acuity described as blurriness in her vision which is her baseline.  She was outside the stroke alert window.  MRI of the brain, MRA of head and neck ordered.  The patient left the waiting room before this was completed and presented to [facility] where she was reevaluated.  CT scan of head consistent with chronic ischemic changes without acute abnormality.  Discussed with neurology, Dr. Leonel Ramsay.  Due to baseline poor kidney function, CTA was deferred.  She was transferred to [facility].  MRI was significant for right frontal centrum semiovale and small left pontine infarcts, again likely small vessel disease. Now on aspirin 81 mg daily and Brilinta 60 mg BID. Neuro to follow-up P2y12 level and consider dose adjustment on Brilinta. Tolerating regular diet. 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  Recent history includes small right cerebral peduncle infarcts 11/10/2021.  She was discharged on dual antiplatelet therapy with aspirin 81 mg daily and Brilinta. She is currently about to go down for her ultrasound.    Review of Systems  Constitutional:  Negative for chills and fever.  HENT:  Positive for congestion. Negative for hearing loss and sore throat.        Chronic sinusitis  Eyes:  Negative for double vision.       Vision improving, still with some sensation of nystagmus  Cardiovascular:  Negative for chest pain and palpitations.  Gastrointestinal:   Positive for nausea. Negative for abdominal pain and vomiting.  Genitourinary:  Positive for urgency.       Feels as though urine output is increasing   Neurological:  Negative for dizziness and headaches.       Nausea can occur if not wearing glasses, sleeps with glasses on   Past Medical History:  Diagnosis Date   Anemia    severe   Anxiety    Asthma    CKD (chronic kidney disease)    stage IV   DDD (degenerative disc disease), cervical    Depression    Diabetes mellitus (HCC)    Dislocated shoulder    right   GERD (gastroesophageal reflux disease)    Headache    migraines (about once a month)   History of kidney stones    Hypertension    Neuropathy    Peripheral vascular disease ([place])    Pneumonia    Stroke [facility])    Past Surgical History:  Procedure Laterality Date   ADENOIDECTOMY     APPENDECTOMY     CERVICAL SPINE SURGERY  06/2012   C5-C6   EYE SURGERY     left eye surgery    FOOT SURGERY     KENALOG INJECTION Left 03/10/2021   Procedure: Intravitreal Avastin Injection;  Surgeon: Sherlynn Stalls, MD;  Location: [place];  Service: Ophthalmology;  Laterality: Left;   LOOP RECORDER INSERTION N/A 08/27/2017   Procedure: LOOP RECORDER INSERTION;  Surgeon: Sanda Klein, MD;  Location: [place] [facility];  Service: Cardiovascular;  Laterality: N/A;   MEMBRANE PEEL Left 03/10/2021   Procedure: MEMBRANE PEEL;  Surgeon: Sherlynn Stalls, MD;  Location: [place];  Service: Ophthalmology;  Laterality: Left;   PARS PLANA VITRECTOMY Left 03/10/2021   Procedure: TWENTY-FIVE GAUGE PARS PLANA VITRECTOMY;  Surgeon: Sherlynn Stalls, MD;  Location: [place];  Service: Ophthalmology;  Laterality: Left;   PHOTOCOAGULATION WITH LASER Left 03/10/2021   Procedure: PHOTOCOAGULATION WITH LASER;  Surgeon: Sherlynn Stalls, MD;  Location: [place];  Service: Ophthalmology;  Laterality: Left;   SHOULDER SURGERY     TEE WITHOUT CARDIOVERSION N/A 08/27/2017   Procedure: TRANSESOPHAGEAL ECHOCARDIOGRAM (TEE);   Surgeon: Sanda Klein, MD;  Location: [facility] ENDOSCOPY;  Service: Cardiovascular;  Laterality: N/A;   TONSILLECTOMY     Family History  Problem Relation Age of Onset   Vascular Disease Mother    CAD Mother    Heart failure Mother    Heart disease Other    Cancer Other        colon, stomach, pancreatic, lung   Diabetes Other    Breast cancer Sister 30   Seizures Maternal Uncle    Social History:  reports that she has never smoked. She has never used smokeless tobacco. She reports that she does not drink alcohol and does not use drugs. Allergies:  Allergies  Allergen Reactions   Lisinopril Swelling and Other (See Comments)    Facial swelling    Penicillins Hives    Has patient had a PCN reaction causing immediate rash, facial/tongue/throat swelling, SOB or lightheadedness with hypotension: yes Has patient had a PCN reaction causing severe rash involving mucus membranes or skin necrosis: No Has patient had a PCN reaction that required hospitalization No Has patient had a PCN reaction occurring within the last 10 years: No If all of the above answers are "NO", then may proceed wit   Gabapentin Swelling and Other (See Comments)    "I thought I was having another stroke."    Medications Prior to Admission  Medication Sig Dispense Refill   albuterol (PROAIR HFA) 108 (90 Base) MCG/ACT inhaler INHALE 1 OR 2 PUFFS INTO THE LUNGS EVERY 6 HOURS AS NEEDED FOR WHEEZING OR SHORTNESS OF BREATH (Patient taking differently: Inhale 1-2 puffs into the lungs every 6 (six) hours as needed for shortness of breath or wheezing.) 8.5 g 0   amLODipine (NORVASC) 5 MG tablet Take 1 tablet (5 mg total) by mouth daily. 90 tablet 2   aspirin 81 MG EC tablet Take 81 mg by mouth daily.     atorvastatin (LIPITOR) 80 MG tablet Take 80 mg by mouth at bedtime.     BRILINTA 60 MG TABS tablet Take 60 mg by mouth 2 (two) times daily.     carboxymethylcellulose (REFRESH PLUS) 0.5 % SOLN Place 1 drop into both eyes daily  as needed (dry eyes).     carvedilol (COREG) 12.5 MG tablet Take 1 tablet (12.5 mg total) by mouth 2 (two) times daily with a meal. 180 tablet 2   Cholecalciferol (VITAMIN D3) 50 MCG (2000 UT) capsule Take 1 capsule (2,000 Units total) by mouth daily.     diclofenac Sodium (VOLTAREN) 1 % GEL Apply 2 g topically daily as needed (pain).     DULoxetine (CYMBALTA) 60 MG capsule Take 60 mg by mouth at bedtime.     fluticasone (FLONASE) 50 MCG/ACT nasal spray Place 2 sprays into both nostrils daily.     glucose blood (CONTOUR NEXT TEST) test strip 1 each by Other route 2 (two) times daily. And lancets 2/day (Patient taking differently: 1 each by Other  route 2 (two) times daily.) 100 each 12   hydrALAZINE (APRESOLINE) 50 MG tablet Take 1 tablet (50 mg total) by mouth in the morning and at bedtime. 90 tablet 2   meclizine (ANTIVERT) 25 MG tablet Take 1 tablet (25 mg total) by mouth 3 (three) times daily as needed for dizziness. 20 tablet 0   montelukast (SINGULAIR) 10 MG tablet Take 10 mg by mouth at bedtime.     pantoprazole (PROTONIX) 40 MG tablet TAKE 1 TABLET(40 MG) BY MOUTH DAILY (Patient taking differently: Take 40 mg by mouth daily.) 30 tablet 5   Semaglutide,0.25 or 0.5MG /DOS, (OZEMPIC, 0.25 OR 0.5 MG/DOSE,) 2 MG/1.5ML SOPN Inject 0.5 mg into the skin See admin instructions. Taking every 10 days     traZODone (DESYREL) 50 MG tablet Take 50 mg by mouth at bedtime as needed for sleep.     Home: Home Living Family/patient expects to be discharged to:: Private residence Living Arrangements: Children Available Help at Discharge: Available PRN/intermittently Type of Home: Apartment Home Access: Level entry [place]: One level Bathroom Shower/Tub: Chiropodist: Standard Bathroom Accessibility: No Home Equipment: Conservation officer, nature (2 wheels), [employer] - single point, Civil engineer, contracting, Grab bars - tub/shower, Hand held shower head Additional Comments: daughter is home during summer from  college, and weekends when she is in school  Lives With: Spouse   Functional History: Prior Function Prior Level of Function : Needs assist Physical Assist : ADLs (physical) ADLs (physical): IADLs, Bathing, Dressing Mobility Comments: uses cane ADLs Comments: has HHAide M/T 2 hours, W/Th/F 2:45 hrs; always has assist with shower, but independnet ADLs and aide assists with IADLs.  pt reports managing her medications with help of daughter   Functional Status:  Mobility: Bed Mobility Overal bed mobility: Needs Assistance Bed Mobility: Sidelying to Sit, Sit to Sidelying Sidelying to sit: Min guard Sit to sidelying: Min guard General bed mobility comments: Min guard for safety Transfers Overall transfer level: Needs assistance Equipment used: 1 person hand held assist Transfers: Sit to/from Stand Sit to Stand: Min assist General transfer comment: Min A for steadying. Performed sit to stand X5 with emphasis on putting weight through LLE for strengthening. Ambulation/Gait Ambulation/Gait assistance: Min assist, Mod assist Gait Distance (Feet): 50 Feet Assistive device: 1 person hand held assist Gait Pattern/deviations: Step-through pattern, Decreased stance time - left, Decreased step length - right, Ataxic, Drifts right/left, Decreased dorsiflexion - left General Gait Details: Min up to mod A for stability when ambulating without AD. Continued to note functional weakness in LLE, however, ataxia had improved. Pt reporting dizziness, so further mobility deferred. Gait velocity: Decreased Gait velocity interpretation: <1.31 ft/sec, indicative of household ambulator   ADL: ADL Overall ADL's : Needs assistance/impaired Grooming: Min guard, Standing Grooming Details (indicate cue type and reason): increased time for fine motor aspects of task and depth perception difficulty aligning toothpaste cap with tube Upper Body Bathing: Sitting, Standing, Moderate assistance Lower Body Bathing:  Sitting/lateral leans, Sit to/from stand, Moderate assistance Upper Body Dressing : Minimal assistance, Standing, Sitting Lower Body Dressing: Min guard, Sitting/lateral leans Lower Body Dressing Details (indicate cue type and reason): uses bil hands to don socks Toilet Transfer: Min guard, Rolling walker (2 wheels), Ambulation, Regular Toilet Toilet Transfer Details (indicate cue type and reason): simulated in room Toileting- Clothing Manipulation and Hygiene: Minimal assistance, Sitting/lateral lean, Sit to/from stand Functional mobility during ADLs: Min guard, Rolling walker (2 wheels)   Cognition: Cognition Overall Cognitive Status: Impaired/Different from baseline Orientation Level:  Oriented X4 Cognition Arousal/Alertness: Awake/alert Behavior During Therapy: WFL for tasks assessed/performed, Flat affect Overall Cognitive Status: Impaired/Different from baseline Area of Impairment: Attention, Following commands, Awareness Current Attention Level: Sustained Following Commands: Follows one step commands with increased time Awareness: Emergent General Comments: Pt states no left sided deficits following prior CVA last month, however there was notable loss of Lt sided function based on notes/assessments from prior admission.  Physical Exam: Height 5\' 2"  (1.575 m), weight 61.5 kg, last menstrual period 05/22/2018. Gen: no distress, normal appearing HEENT: oral mucosa pink and moist, NCAT Cardio: Reg rate Chest: normal effort, normal rate of breathing Abd: soft, non-distended Ext: no edema Psych: pleasant, normal affect Skin: intact Neuro/MSK: Alert and oriented x3, no aphasia, +nystagmus, right INO, strength and sensation intact.  Results for orders placed or performed during the hospital encounter of 12/18/21 (from the past 48 hour(s))  CBC with Differential/Platelet     Status: Abnormal   Collection Time: 12/22/21  2:10 AM  Result Value Ref Range   WBC 5.4 4.0 - 10.5 K/uL    RBC 2.98 (L) 3.87 - 5.11 MIL/uL   Hemoglobin 8.6 (L) 12.0 - 15.0 g/dL   HCT 25.9 (L) 36.0 - 46.0 %   MCV 86.9 80.0 - 100.0 fL   MCH 28.9 26.0 - 34.0 pg   MCHC 33.2 30.0 - 36.0 g/dL   RDW 13.2 11.5 - 15.5 %   Platelets 198 150 - 400 K/uL   nRBC 0.0 0.0 - 0.2 %   Neutrophils Relative % 41 %   Neutro Abs 2.2 1.7 - 7.7 K/uL   Lymphocytes Relative 51 %   Lymphs Abs 2.7 0.7 - 4.0 K/uL   Monocytes Relative 5 %   Monocytes Absolute 0.3 0.1 - 1.0 K/uL   Eosinophils Relative 2 %   Eosinophils Absolute 0.1 0.0 - 0.5 K/uL   Basophils Relative 1 %   Basophils Absolute 0.0 0.0 - 0.1 K/uL   Immature Granulocytes 0 %   Abs Immature Granulocytes 0.00 0.00 - 0.07 K/uL    Comment: Performed at [facility], [address], [place], [place] [address]  Comprehensive metabolic panel     Status: Abnormal   Collection Time: 12/22/21  2:10 AM  Result Value Ref Range   Sodium 141 135 - 145 mmol/L   Potassium 3.6 3.5 - 5.1 mmol/L   Chloride 107 98 - 111 mmol/L   CO2 24 22 - 32 mmol/L   Glucose, Bld 88 70 - 99 mg/dL    Comment: Glucose reference range applies only to samples taken after fasting for at least 8 hours.   BUN 42 (H) 6 - 20 mg/dL   Creatinine, Ser 3.18 (H) 0.44 - 1.00 mg/dL   Calcium 9.2 8.9 - 10.3 mg/dL   Total Protein 6.2 (L) 6.5 - 8.1 g/dL   Albumin 3.4 (L) 3.5 - 5.0 g/dL   AST 16 15 - 41 U/L   ALT 11 0 - 44 U/L   Alkaline Phosphatase 39 38 - 126 U/L   Total Bilirubin 0.5 0.3 - 1.2 mg/dL   GFR, Estimated 17 (L) >60 mL/min    Comment: (NOTE) Calculated using the CKD-EPI Creatinine Equation (2021)    Anion gap 10 5 - 15    Comment: Performed at [facility], [place] [address], [place], [place] [address]  Magnesium     Status: None   Collection Time: 12/22/21  2:10 AM  Result Value Ref Range   Magnesium  2.0 1.7 - 2.4 mg/dL    Comment: Performed at [facility], [place] [address], [place], [place] [address]  Phosphorus     Status: Abnormal   Collection Time: 12/22/21   2:10 AM  Result Value Ref Range   Phosphorus 4.7 (H) 2.5 - 4.6 mg/dL    Comment: Performed at [facility] [address], [place], [place] [address]  CBC with Differential/Platelet     Status: Abnormal   Collection Time: 12/23/21  3:14 AM  Result Value Ref Range   WBC 5.1 4.0 - 10.5 K/uL   RBC 2.90 (L) 3.87 - 5.11 MIL/uL   Hemoglobin 8.5 (L) 12.0 - 15.0 g/dL   HCT 25.6 (L) 36.0 - 46.0 %   MCV 88.3 80.0 - 100.0 fL   MCH 29.3 26.0 - 34.0 pg   MCHC 33.2 30.0 - 36.0 g/dL   RDW 13.2 11.5 - 15.5 %   Platelets 182 150 - 400 K/uL   nRBC 0.0 0.0 - 0.2 %   Neutrophils Relative % 37 %   Neutro Abs 1.9 1.7 - 7.7 K/uL   Lymphocytes Relative 54 %   Lymphs Abs 2.8 0.7 - 4.0 K/uL   Monocytes Relative 6 %   Monocytes Absolute 0.3 0.1 - 1.0 K/uL   Eosinophils Relative 2 %   Eosinophils Absolute 0.1 0.0 - 0.5 K/uL   Basophils Relative 1 %   Basophils Absolute 0.0 0.0 - 0.1 K/uL   Immature Granulocytes 0 %   Abs Immature Granulocytes 0.00 0.00 - 0.07 K/uL    Comment: Performed at [facility] [address], [place], [place] [address]  Comprehensive metabolic panel     Status: Abnormal   Collection Time: 12/23/21  3:14 AM  Result Value Ref Range   Sodium 141 135 - 145 mmol/L   Potassium 3.3 (L) 3.5 - 5.1 mmol/L   Chloride 107 98 - 111 mmol/L   CO2 27 22 - 32 mmol/L   Glucose, Bld 101 (H) 70 - 99 mg/dL    Comment: Glucose reference range applies only to samples taken after fasting for at least 8 hours.   BUN 44 (H) 6 - 20 mg/dL   Creatinine, Ser 3.72 (H) 0.44 - 1.00 mg/dL   Calcium 9.1 8.9 - 10.3 mg/dL   Total Protein 6.3 (L) 6.5 - 8.1 g/dL   Albumin 3.3 (L) 3.5 - 5.0 g/dL   AST 15 15 - 41 U/L   ALT 15 0 - 44 U/L   Alkaline Phosphatase 43 38 - 126 U/L   Total Bilirubin 0.5 0.3 - 1.2 mg/dL   GFR, Estimated 14 (L) >60 mL/min    Comment: (NOTE) Calculated using the CKD-EPI Creatinine Equation (2021)    Anion gap 7 5 - 15    Comment: Performed at [facility],  [place] [address], [place], [place] [address]  Magnesium     Status: None   Collection Time: 12/23/21  3:14 AM  Result Value Ref Range   Magnesium 2.1 1.7 - 2.4 mg/dL    Comment: Performed at [facility] [address], [place], [place] [address]  Phosphorus     Status: Abnormal   Collection Time: 12/23/21  3:14 AM  Result Value Ref Range   Phosphorus 5.1 (H) 2.5 - 4.6 mg/dL    Comment: Performed at [facility] [address], [place],  [address]  Urinalysis, Routine w reflex microscopic Urine, Clean Catch  Status: Abnormal   Collection Time: 12/23/21  8:53 AM  Result Value Ref Range   Color, Urine YELLOW YELLOW   APPearance CLEAR CLEAR   Specific Gravity, Urine 1.010 1.005 - 1.030   pH 5.0 5.0 - 8.0   Glucose, UA NEGATIVE NEGATIVE mg/dL   Hgb urine dipstick NEGATIVE NEGATIVE   Bilirubin Urine NEGATIVE NEGATIVE   Ketones, ur NEGATIVE NEGATIVE mg/dL   Protein, ur NEGATIVE NEGATIVE mg/dL   Nitrite NEGATIVE NEGATIVE   Leukocytes,Ua SMALL (A) NEGATIVE   RBC / HPF 0-5 0 - 5 RBC/hpf   WBC, UA 0-5 0 - 5 WBC/hpf   Bacteria, UA RARE (A) NONE SEEN   Squamous Epithelial / LPF 0-5 0 - 5   Mucus PRESENT    Hyaline Casts, UA PRESENT     Comment: Performed at [facility], [address], [place], [place] [address]   US RENAL  Result Date: 12/23/2021 CLINICAL DATA:  Acute kidney injury. EXAM: RENAL / URINARY TRACT ULTRASOUND COMPLETE COMPARISON:  November 21, 2021. FINDINGS: Right Kidney: Renal measurements: 8.6 x 4.5 x 4.8 cm = volume: 95.9 mL. Shadowing 6 mm calculus in the lower pole the RIGHT kidney. Minimal fullness of the RIGHT renal pelvis without hydronephrosis. Mild increased cortical echogenicity. Left Kidney: Renal measurements: 8.4 x 5.1 x 5.4 cm = volume: 120.8 mL. Mild increased cortical echogenicity without hydronephrosis or visible calculus. Smooth renal contours. Bladder: Bilateral ureteral jets are visualized. Bladder is unremarkable given degree of  distension. Other: None. IMPRESSION: Mild fullness of RIGHT renal pelvis without hydronephrosis. Correlate with any symptoms of RIGHT flank pain and consider follow-up as clinically warranted. Bilateral ureteral jets are seen currently. Nonobstructing RIGHT lower pole calculus. Increased cortical echogenicity the kidneys compatible with medical renal disease. Electronically Signed   By: Zetta Bills M.D.   On: 12/23/2021 11:29      Height 5\' 2"  (1.575 m), weight 61.5 kg, last menstrual period 05/22/2018.  Medical Problem List and Plan: 1. 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  Antithrombotics: -DVT/anticoagulation:  TED hose  -antiplatelet therapy: aspirin 81 mg daily, Brilinta 60 mg daily 3. Pain Management: Tylenol as needed 4. Depression:  Continue Cymbalta DR 60 mg q HS  -antipsychotic agents: n/a  -continue trazodone prn sleep 5. Neuropsych/cognition: This patient is capable of making decisions on her own behalf. 6. Skin/Wound Care: Routine skin care checks 7. Fluids/Electrolytes/Nutrition: Routine Is and Os and follow-up chemistries 8: Hypertension: monitor and consider home hydralazine 50 mg q AM, HS -continue amlodipine 5 mg daily  -continue carvedilol 12.5 mg BID  -continue Lasix 40 mg daily 9: Hyperlipidemia: continue Lipitor 10: Seasonal allergies: continue fulticisone and  11: CKD stage IV: baseline Scr appears to be about 2.6-3.0; currently 3.72  -follow-up BMP and trend 12: DM-2: continue SSSI and CBGs (Hgb A1c = 6.3) 13: Seizure disorder: no new seizure activity; off Keppra. Will follow-up with [facility] 14: GERD: continue Protonix 15: Migraine headache history 16: Anemia, chronic disease: follow-up CBC (trending downward over last 24 months) 17: Hypokalemia:  (Lasix stopped 7/14)  follow-up BMP  I have personally  performed a face to face diagnostic evaluation, including, but not limited to relevant history and physical exam findings, of this patient and developed relevant assessment and plan.  Additionally, I have reviewed and concur with the physician assistant's documentation above.  Katharine Look  [place], [place]  Izora Ribas, MD 12/23/2021

## 2021-12-23 NOTE — Progress Notes (Signed)
Patient voided on return from ultrasound; then bladder scan revealed 20 ml residual. No acute distress.

## 2021-12-23 NOTE — Progress Notes (Signed)
Izora Ribas, MD  Physician Physical Medicine and Rehabilitation PMR Pre-admission    Signed Date of Service:  12/21/2021  3:22 PM  Related encounter: ED to Hosp-Admission (Discharged) from 12/18/2021 in [facility]   Signed                                                                                                                                                                                                                                                                                                                                                                                                                                                                                           PMR Admission Coordinator Pre-Admission Assessment   Patient: Christina Orozco is an 51 y.o., female MRN: [id number] DOB: [birth year] Height: '5\' 2"'  (157.5 cm) Weight: 68.5 kg   Insurance Information HMO:    PPO:      PCP:      IPA:      80/20:      OTHER: Group [id number] PRIMARY: BCBS [place]      Policy#: [id number]      Subscriber: Self CM Name:  Quentin Ore     Phone#: [phone]     Fax#: [phone] Pre-Cert#: [id number] auth for 7 days from 12/23/21 to 12/30/21       Employer: Not employed Benefits:  Phone #: [phone]     Name: Online verification Eff Date: 12/11/2019 - 02/09/2022 Deductible: $0 - does not have OOP Max: $0 - does not have CIR: 100% coverage SNF: 100% coverage Outpatient: $4 co-pay; limited to 27 visits/cal yr  Home Health: 100% coverage; limited by medical necessity review DME: 100% coverage; limited by medical necessity review  SECONDARY:       Policy#:      Phone#:    Development worker, community:       Phone#:    The Therapist, art Information Summary" for patients in  Inpatient Rehabilitation Facilities with attached "Privacy Act [place] Records" was provided and verbally reviewed with: N/A   Emergency Contact Information Contact Information       Name Relation Home Work Mobile    Braaten,[name] Brother [phone]        GIll,Anitraous Daughter     [phone]           Current Medical History  Patient Admitting Diagnosis: R CVA   History of Present Illness:  Christina Orozco is a 51 y.o. female with medical history significant of CKD stage IV, asthma, anxiety and depression, T2DM, HTN, GERD, PVD, hx of CVA on DAPT who presented to [facility] ED on 7/10 with complaints of left sided weakness and vision changes. She was also hospitalized in June 2023 for possible CVA. MRI  at that time showed possible small right cerebral peduncle infarcts due to small vessel disease. She was discharged with instructions to take Brilinta 80m BID for 30 days and then resume ASA and Plavix. [facility] switched her from Plavix to Brilinta 642mBID and ASA 8148mVitals and labs In the ED on 7/10 were as follows: bP: 149/91, HR 82, RR: 18, oxygen: 99%on RA  hgb: 9.1, potassium: 3.4, BUN: 41, creatinine: 3.12.  CT of the head revealed no acute finding. MRI of the head revealed .9 cm acute ischemic nonhemorrhagic infarct involving the deep  white matter of the posterior right frontal centrum semi ovale. Pt. Seen by PT and OT and they recommended CIR to assist return to PLOF. Patient is having a renal US [place] and then will admit to CIR today.   Complete NIHSS TOTAL: 2   [facility] admission coordinator and physician.   Past Medical History      Past Medical History:  Diagnosis Date   Anemia      severe   Anxiety     Asthma     CKD (chronic kidney disease)      stage IV   DDD (degenerative disc disease), cervical     Depression      Diabetes mellitus (HCC)     Dislocated shoulder      right   GERD (gastroesophageal reflux disease)     Headache      migraines (about once a month)   History of kidney stones     Hypertension     Neuropathy     [facility]   Pneumonia     [facility]      Has the patient had major surgery during 100 days prior to admission? Yes  Family History   family history includes Breast cancer (age of onset: 77) in her sister; CAD in her mother; Cancer in an other family member; Diabetes in an other family member; Heart disease in an other family member; Heart failure in her mother; Seizures in her maternal uncle; Vascular Disease in her mother.   Current Medications   Current Facility-Administered Medications:    acetaminophen (TYLENOL) tablet 650 mg, 650 mg, Oral, Q4H PRN, Wynona Dove A, DO, 650 mg at 12/19/21 2216   albuterol (PROVENTIL) (2.5 MG/3ML) 0.083% nebulizer solution 2.5 mg, 2.5 mg, Inhalation, Q6H PRN, Orma Flaming, MD   amLODipine (NORVASC) tablet 5 mg, 5 mg, Oral, Daily, Roslynn Amble, Ellwood Dense, MD, 5 mg at 12/23/21 1024   aspirin EC tablet 81 mg, 81 mg, Oral, Daily, Orma Flaming, MD, 81 mg at 12/23/21 1024   atorvastatin (LIPITOR) tablet 80 mg, 80 mg, Oral, QHS, Lucrezia Starch, MD, 80 mg at 12/22/21 2102   carvedilol (COREG) tablet 12.5 mg, 12.5 mg, Oral, BID WC, Lucrezia Starch, MD, 12.5 mg at 12/23/21 0803   DULoxetine (CYMBALTA) DR capsule 60 mg, 60 mg, Oral, QHS, Orma Flaming, MD, 60 mg at 12/22/21 2102   fluticasone (FLONASE) 50 MCG/ACT nasal spray 2 spray, 2 spray, Each Nare, Daily, Orma Flaming, MD, 2 spray at 12/23/21 1026   montelukast (SINGULAIR) tablet 10 mg, 10 mg, Oral, QHS, Orma Flaming, MD, 10 mg at 12/22/21 2102   ondansetron (ZOFRAN-ODT) disintegrating tablet 4 mg, 4 mg, Oral, Q8H PRN, Elodia Florence., MD, 4 mg at 12/22/21 0840   pantoprazole (PROTONIX) EC tablet 40 mg, 40 mg, Oral, Daily, Orma Flaming, MD, 40 mg  at 12/23/21 1024   polyvinyl alcohol (LIQUIFILM TEARS) 1.4 % ophthalmic solution 1 drop, 1 drop, Both Eyes, Daily PRN, Madueme, Elvira C, RPH   senna-docusate (Senokot-S) tablet 1 tablet, 1 tablet, Oral, QHS PRN, Orma Flaming, MD, 1 tablet at 12/20/21 2143   ticagrelor (BRILINTA) tablet 60 mg, 60 mg, Oral, BID, Orma Flaming, MD, 60 mg at 12/23/21 1024   traZODone (DESYREL) tablet 50 mg, 50 mg, Oral, QHS PRN, Lucrezia Starch, MD, 50 mg at 12/22/21 2106   Patients Current Diet:  Diet Order                  Diet Heart Room service appropriate? Yes; Fluid consistency: Thin  Diet effective now                         Precautions / Restrictions Precautions Precautions: Fall Precaution Comments: fall last month Restrictions Weight Bearing Restrictions: No    Has the patient had 2 or more falls or a fall with injury in the past year? No   Prior Activity Level Community (5-7x/wk): Pt. active in the community PTA   Prior Functional Level Self Care: Did the patient need help bathing, dressing, using the toilet or eating? Independent   Indoor Mobility: Did the patient need assistance with walking from room to room (with or without device)? Independent   Stairs: Did the patient need assistance with internal or external stairs (with or without device)? Independent   Functional Cognition: Did the patient need help planning regular tasks such as shopping or remembering to take medications? Independent   Patient Information Are you of Hispanic, Latino/a,or Spanish origin?: A. No, not of Hispanic, Latino/a, or Spanish origin What is your race?: B. Black or African American Do you need or want an interpreter to  communicate with a doctor or health care staff?: 0. No   Patient's Response To:  Health Literacy and Transportation Is the patient able to respond to health literacy and transportation needs?: Yes Health Literacy - How often do you need to have someone help you when you  read instructions, pamphlets, or other written material from your doctor or pharmacy?: Never In the past 12 months, has lack of transportation kept you from medical appointments or from getting medications?: No In the past 12 months, has lack of transportation kept you from meetings, work, or from getting things needed for daily living?: No   Home Assistive Devices / [place] Devices/Equipment: Radio producer (specify quad or straight), Eyeglasses, Grab bars around toilet, Grab bars in shower, Hand-held shower hose, Built-in shower seat (straight cane) Home Equipment: Conservation officer, nature (2 wheels), [employer] - single point, Shower seat, Grab bars - tub/shower, Hand held shower head   Prior Device Use: Indicate devices/aids used by the patient prior to current illness, exacerbation or injury? Walker   Current Functional Level Cognition   Overall Cognitive Status: Impaired/Different from baseline Current Attention Level: Sustained Orientation Level: Oriented X4 Following Commands: Follows one step commands with increased time General Comments: Pt states no left sided deficits following prior CVA last month, however there was notable loss of Lt sided function based on notes/assessments from prior admission.    Extremity Assessment (includes Sensation/Coordination)   Upper Extremity Assessment: LUE deficits/detail LUE Deficits / Details: AAROM, shoulder flexion 80* (prior rotator cuff tear), elbow WFL, wrist flexion/extension WFL, all movements are slow and controlled. Able to perform digit opposition at decreased speed LUE Sensation: WNL LUE Coordination: decreased fine motor, decreased gross motor  Lower Extremity Assessment: Defer to PT evaluation LLE Deficits / Details: residual deficits from prior CVA, AROM, strength grossly 3/5, apraxic at times with left ankle DF. LLE Sensation: decreased light touch, decreased proprioception LLE Coordination: decreased fine motor, decreased gross motor      ADLs   Overall ADL's : Needs assistance/impaired Grooming: Min guard, Standing Grooming Details (indicate cue type and reason): increased time for fine motor aspects of task and depth perception difficulty aligning toothpaste cap with tube Upper Body Bathing: Sitting, Standing, Moderate assistance Lower Body Bathing: Sitting/lateral leans, Sit to/from stand, Moderate assistance Upper Body Dressing : Minimal assistance, Standing, Sitting Lower Body Dressing: Min guard, Sitting/lateral leans Lower Body Dressing Details (indicate cue type and reason): uses bil hands to don socks Toilet Transfer: Min guard, Rolling walker (2 wheels), Ambulation, Regular Toilet Toilet Transfer Details (indicate cue type and reason): simulated in room Toileting- Clothing Manipulation and Hygiene: Minimal assistance, Sitting/lateral lean, Sit to/from stand Functional mobility during ADLs: Min guard, Rolling walker (2 wheels)     Mobility   Overal bed mobility: Needs Assistance Bed Mobility: Sidelying to Sit, Sit to Sidelying Sidelying to sit: Min guard Sit to sidelying: Min guard General bed mobility comments: Min guard for safety     Transfers   Overall transfer level: Needs assistance Equipment used: 1 person hand held assist Transfers: Sit to/from Stand Sit to Stand: Min assist General transfer comment: Min A for steadying. Performed sit to stand X5 with emphasis on putting weight through LLE for strengthening.     Ambulation / Gait / Stairs / Wheelchair Mobility   Ambulation/Gait Ambulation/Gait assistance: Min assist, Mod assist Gait Distance (Feet): 50 Feet Assistive device: 1 person hand held assist Gait Pattern/deviations: Step-through pattern, Decreased stance time - left, Decreased step length - right, Ataxic,  Drifts right/left, Decreased dorsiflexion - left General Gait Details: Min up to mod A for stability when ambulating without AD. Continued to note functional weakness in LLE, however,  ataxia had improved. Pt reporting dizziness, so further mobility deferred. Gait velocity: Decreased Gait velocity interpretation: <1.31 ft/sec, indicative of household ambulator     Posture / Balance Dynamic Sitting Balance Sitting balance - Comments: can perform figure 4 for LB dressing Balance Overall balance assessment: Needs assistance Sitting-balance support: No upper extremity supported, Feet supported Sitting balance-Leahy Scale: Fair Sitting balance - Comments: can perform figure 4 for LB dressing Postural control: Posterior lean Standing balance support: Single extremity supported Standing balance-Leahy Scale: Poor Standing balance comment: Reliant on UE and external support     Special needs/care consideration Skin Intact    Previous Home Environment (from acute therapy documentation) Living Arrangements: Children  Lives With: Spouse Available Help at Discharge: Available PRN/intermittently Type of Home: Apartment Home Layout: One level Home Access: Level entry Bathroom Shower/Tub: Chiropodist: Standard Bathroom Accessibility: No Home Care Services: Yes Type of Home Care Services: [place] (if known): [place] Family Additional Comments: daughter is home during summer from college, and weekends when she is in school   Discharge Living Setting Plans for Discharge Living Setting: Patient's home Type of Home at Discharge: Apartment Discharge Home Layout: One level Discharge Home Access: Level entry Discharge Bathroom Shower/Tub: Tub/shower unit Discharge Bathroom Toilet: Standard Discharge Bathroom Accessibility: Yes How Accessible: Accessible via walker, Accessible via wheelchair Does the patient have any problems obtaining your medications?: No   Social/Family/Support Systems Patient Roles: Other (Comment) Contact Information: [phone] Anticipated Caregiver: Anitraous Gil Ability/Limitations of Caregiver: Min  A Caregiver Availability: 24/7   Goals Patient/Family Goal for Rehab: PT OT supervision Expected length of stay: 7-10 days Pt/Family Agrees to Admission and willing to participate: Yes Program Orientation Provided & Reviewed with Pt/Caregiver Including Roles  & Responsibilities: Yes   Decrease burden of Care through IP rehab admission: n/a   Possible need for SNF placement upon discharge: not anticipated    Patient Condition: I have reviewed medical records from [facility] , spoken with CM, and patient. I met with patient at the bedside for inpatient rehabilitation assessment.  Patient will benefit from ongoing PT and OT, can actively participate in 3 hours of therapy a day 5 days of the week, and can make measurable gains during the admission.  Patient will also benefit from the coordinated team approach during an Inpatient Acute Rehabilitation admission.  The patient will receive intensive therapy as well as Rehabilitation physician, nursing, social worker, and care management interventions.  Due to safety, skin/wound care, disease management, medication administration, pain management, and patient education the patient requires 24 hour a day rehabilitation nursing.  The patient is currently min guard to mod assist with mobility and basic ADLs.  Discharge setting and therapy post discharge at home with home health is anticipated.  Patient has agreed to participate in the Acute Inpatient Rehabilitation Program and will admit today.   Preadmission Screen Completed By:  Retta Diones, 12/21/2021 12:08 PM ______________________________________________________________________   Discussed status with Dr. Ranell Patrick on 12/23/21 at 1000 and received approval for admission today.   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 Yes Co-Morbidities requiring supervision/potential complications: history of stroke, history of seizure, HTN, type 2 diabetes, stage 4 CKD, anxiety, GERD Due to bladder management, bowel management, safety, skin/wound care, disease management, medication administration, pain management, and patient education, does the patient require 24 hr/day rehab nursing? Yes Does the patient require coordinated care of a physician, rehab nurse, PT, OT, and SLP to address physical and functional deficits in the context of the above medical diagnosis(es)? Yes Addressing deficits in the following areas: balance, endurance, locomotion, strength, transferring, bowel/bladder control, bathing, dressing, feeding, grooming, toileting, and psychosocial support Can the patient actively participate in an intensive therapy program of at least 3 hrs of therapy 5 days a week? Yes The potential for patient to make measurable gains while on inpatient rehab is excellent Anticipated functional outcomes upon discharge from inpatient rehab: modified independent PT, modified independent OT, modified independent SLP Estimated rehab length of stay to reach the above functional goals is: 7-9 days Anticipated discharge destination: Home 10. Overall Rehab/Functional Prognosis: excellent     MD Signature: Leeroy Cha, MD         Revision History                                                    Note Details  Author Izora Ribas, MD File Time 12/23/2021 12:12 PM  Author Type Physician Status Signed  Last Editor Izora Ribas, MD Service Physical Medicine and [place] # [id number] Admit Date 12/23/2021

## 2021-12-23 NOTE — Assessment & Plan Note (Addendum)
Creatinine bumped today, hold lasix and trend Renal US -> mild fullness of right renal pelvis without hydro UA without RBCs, protein  Follow in CIR

## 2021-12-23 NOTE — Discharge Summary (Signed)
Physician Discharge Summary  Christina Orozco BPZ:[id number] DOB: Nov 09, 1970 DOA: 12/18/2021  PCP: Christina Lucks, PA-C  Admit date: 12/18/2021 Discharge date: 12/23/2021  Time spent: 40 minutes  Recommendations for Outpatient Follow-up:  Follow CBC/CMP  AKI, trend off lasix  Follow with neurology outpatient, consider LP   Hold hydralazine for now, consider resumption in Christina Orozco few days pending BP Holding lasix with AKI, resume as able   Discharge Diagnoses:  Principal Problem:   Left-sided weakness and visual deficits  Active Problems:   AKI (acute kidney injury) ([place])   Hypokalemia   History of CVA with residual deficit   Hypernatremia   CKD (chronic kidney disease) stage 4, GFR 15-29 ml/min (HCC)   History of seizure   Hypertension   Hyperlipidemia   Asthma, mild intermittent   Anemia of chronic disease   Generalized anxiety disorder   Gastroesophageal reflux disease without esophagitis   Stroke [facility])   Vision abnormalities  Discharge Condition: stable  Diet recommendation: heart healthy, diabetic  Filed Weights   12/18/21 2108  Weight: 68.5 kg    History of present illness:   Christina Orozco is Christina Orozco 51 y.o. female with medical history significant of CKD stage IV, asthma, anxiety and depression, T2DM, HTN, GERD, PVD, hx of CVA on DAPT who presented to [facility] ED on 7/9 with complaints of left sided weakness and vision changes. Her MRI showed 1.9 cm acute ischemic nonhemorrhagic infarct involving the deep white matter of the posterior right frontal centrum semi ovale.  Discharging to CIR today.  Hospital Course:  Assessment and Plan: * Left-sided weakness and visual deficits  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 who presented with 2-3 day history of left sided weakness and visual deficits MRI brain with 1.9 cm acute ischemic nonhemorrhagic infarct involving the deep white matter of the posterior right frontal centrum semi ovale, advanced chronic  microvascular ischemic disease with multiple remote lacunar inarcts involving the bilateral basal ganglia, thalami, pons, and cerebellum, multiple chronic micro hemorrhages clustered about the brainstem and thalami consistent with chronic poorly controlled hypertension MRA head/neck normal intracranial MRA, normal MRA of the neck LDL 36, a1c 6.3 Neurology c/s, appreciate recs - aspirin, brilinta, statin, pending P2y12 (39) for consideration of adjusting brilinta dose.  May consider outpatient LP?  AKI (acute kidney injury) ([place]) Creatinine bumped today, hold lasix and trend Renal US -> mild fullness of right renal pelvis without hydro UA without RBCs, protein  Follow in CIR  Hypernatremia Mild, follow  History of CVA with residual deficit Continue DAPT Stroke w/u per above  Hypokalemia Continue to follow  CKD (chronic kidney disease) stage 4, GFR 15-29 ml/min (HCC) Creatinine baseline around 2.7-3.0, relatively stable AKI -> trend Continue to monitor  History of seizure Seizure precautions. No longer on keppra.    Hypertension Continue home medication of coreg 12.5mg  BID, norvasc 5mg  daily Holding hydralzine 50mg  BID for now Hold lasix with aki   Hyperlipidemia LDL 36 Continue high dose statin, lipitor 80mg    Asthma, mild intermittent Stable, continue SABA prn   Anemia of chronic disease Baseline around 10, continue to monitor   Generalized anxiety disorder Continue cymbalta   Gastroesophageal reflux disease without esophagitis Continue protonix      Procedures: none   Consultations: Neurology  Discharge Exam: Vitals:   12/23/21 0428 12/23/21 0749  BP: 130/76 137/81  Pulse: 69 70  Resp: 18 20  Temp: 97.6 F (36.4 C) 97.9 F (36.6 C)  SpO2: 100%  100%   Nad Feeling better   General: No acute distress. Cardiovascular: RRR Lungs: unlabored  Neurological: improving LUE strength Extremities: No clubbing or cyanosis. No edema.   Discharge  Instructions   Discharge Instructions     Call MD for:  difficulty breathing, headache or visual disturbances   Complete by: As directed    Call MD for:  extreme fatigue   Complete by: As directed    Call MD for:  hives   Complete by: As directed    Call MD for:  persistant dizziness or light-headedness   Complete by: As directed    Call MD for:  persistant nausea and vomiting   Complete by: As directed    Call MD for:  redness, tenderness, or signs of infection (pain, swelling, redness, odor or green/yellow discharge around incision site)   Complete by: As directed    Call MD for:  severe uncontrolled pain   Complete by: As directed    Call MD for:  temperature >100.4   Complete by: As directed    Diet - low sodium heart healthy   Complete by: As directed    Discharge instructions   Complete by: As directed    You were seen for Christina Orozco stroke.  Neurology recommends you continue your aspirin and brilinta and statin.  Neurology may consider an outpatient LP.  Your kidney function is down today.  I'm recommending your lasix be held for Christina Orozco few days.    We're continuing your amlodipine and coreg.  We're holding your hydralazine for now, but this maybe resumed in Christina Orozco few days depending on your blood pressure.  Your lasix will be held for now until your renal function recovers.  Continue to follow your diabetes and blood pressure with your PCP.  Return for new, recurrent, or worsening symptoms.  Please ask your PCP to request records from this hospitalization so they know what was done and what the next steps will be.   Increase activity slowly   Complete by: As directed       Allergies as of 12/23/2021       Reactions   Lisinopril Swelling, Other (See Comments)   Facial swelling    Penicillins Hives   Has patient had Christina Orozco PCN reaction causing immediate rash, facial/tongue/throat swelling, SOB or lightheadedness with hypotension: yes Has patient had Christina Orozco PCN reaction causing severe rash  involving mucus membranes or skin necrosis: No Has patient had Christina Orozco PCN reaction that required hospitalization No Has patient had Arwyn Besaw PCN reaction occurring within the last 10 years: No If all of the above answers are "NO", then may proceed wit   Gabapentin Swelling, Other (See Comments)   "I thought I was having another stroke."         Medication List     STOP taking these medications    furosemide 20 MG tablet Commonly known as: LASIX   levETIRAcetam 500 MG tablet Commonly known as: KEPPRA       TAKE these medications    albuterol 108 (90 Base) MCG/ACT inhaler Commonly known as: ProAir HFA INHALE 1 OR 2 PUFFS INTO THE LUNGS EVERY 6 HOURS AS NEEDED FOR WHEEZING OR SHORTNESS OF BREATH What changed:  how much to take how to take this when to take this reasons to take this additional instructions   amLODipine 5 MG tablet Commonly known as: NORVASC Take 1 tablet (5 mg total) by mouth daily.   aspirin EC 81 MG tablet Take 81 mg by  mouth daily.   atorvastatin 80 MG tablet Commonly known as: LIPITOR Take 80 mg by mouth at bedtime.   Brilinta 60 MG Tabs tablet Generic drug: ticagrelor Take 60 mg by mouth 2 (two) times daily.   carboxymethylcellulose 0.5 % Soln Commonly known as: REFRESH PLUS Place 1 drop into both eyes daily as needed (dry eyes).   carvedilol 12.5 MG tablet Commonly known as: COREG Take 1 tablet (12.5 mg total) by mouth 2 (two) times daily with Yarixa Lightcap meal.   diclofenac Sodium 1 % Gel Commonly known as: VOLTAREN Apply 2 g topically daily as needed (pain).   DULoxetine 60 MG capsule Commonly known as: CYMBALTA Take 60 mg by mouth at bedtime.   fluticasone 50 MCG/ACT nasal spray Commonly known as: FLONASE Place 2 sprays into both nostrils daily.   glucose blood test strip Commonly known as: Contour Next Test 1 each by Other route 2 (two) times daily. And lancets 2/day What changed: additional instructions   hydrALAZINE 50 MG tablet Commonly  known as: APRESOLINE Take 1 tablet (50 mg total) by mouth in the morning and at bedtime.   meclizine 25 MG tablet Commonly known as: ANTIVERT Take 1 tablet (25 mg total) by mouth 3 (three) times daily as needed for dizziness.   montelukast 10 MG tablet Commonly known as: SINGULAIR Take 10 mg by mouth at bedtime.   Ozempic (0.25 or 0.5 MG/DOSE) 2 MG/1.5ML Sopn Generic drug: Semaglutide(0.25 or 0.5MG /DOS) Inject 0.5 mg into the skin See admin instructions. Taking every 10 days   pantoprazole 40 MG tablet Commonly known as: PROTONIX TAKE 1 TABLET(40 MG) BY MOUTH DAILY What changed: See the new instructions.   traZODone 50 MG tablet Commonly known as: DESYREL Take 50 mg by mouth at bedtime as needed for sleep.   Vitamin D3 50 MCG (2000 UT) capsule Take 1 capsule (2,000 Units total) by mouth daily.       Allergies  Allergen Reactions   Lisinopril Swelling and Other (See Comments)    Facial swelling    Penicillins Hives    Has patient had Rether Rison PCN reaction causing immediate rash, facial/tongue/throat swelling, SOB or lightheadedness with hypotension: yes Has patient had Donyea Beverlin PCN reaction causing severe rash involving mucus membranes or skin necrosis: No Has patient had Shayanna Thatch PCN reaction that required hospitalization No Has patient had Tiera Mensinger PCN reaction occurring within the last 10 years: No If all of the above answers are "NO", then may proceed wit   Gabapentin Swelling and Other (See Comments)    "I thought I was having another stroke."     Follow-up Information     [facility]. Go on 01/25/2022.                   The results of significant diagnostics from this hospitalization (including imaging, microbiology, ancillary and laboratory) are listed below for reference.    Significant Diagnostic Studies: US RENAL  Result Date: 12/23/2021 CLINICAL DATA:  Acute kidney injury. EXAM: RENAL / URINARY TRACT ULTRASOUND COMPLETE COMPARISON:  November 21, 2021. FINDINGS: Right  Kidney: Renal measurements: 8.6 x 4.5 x 4.8 cm = volume: 95.9 mL. Shadowing 6 mm calculus in the lower pole the RIGHT kidney. Minimal fullness of the RIGHT renal pelvis without hydronephrosis. Mild increased cortical echogenicity. Left Kidney: Renal measurements: 8.4 x 5.1 x 5.4 cm = volume: 120.8 mL. Mild increased cortical echogenicity without hydronephrosis or visible calculus. Smooth renal contours. Bladder: Bilateral ureteral jets are visualized. Bladder is unremarkable given  degree of distension. Other: None. IMPRESSION: Mild fullness of RIGHT renal pelvis without hydronephrosis. Correlate with any symptoms of RIGHT flank pain and consider follow-up as clinically warranted. Bilateral ureteral jets are seen currently. Nonobstructing RIGHT lower pole calculus. Increased cortical echogenicity the kidneys compatible with medical renal disease. Electronically Signed   By: Zetta Bills M.D.   On: 12/23/2021 11:29   MR BRAIN WO CONTRAST  Result Date: 12/19/2021 CLINICAL DATA:  Initial evaluation for neuro deficit, stroke suspected. EXAM: MRI HEAD WITHOUT CONTRAST MRA HEAD WITHOUT CONTRAST MRA NECK WITHOUT CONTRAST TECHNIQUE: Multiplanar, multiecho pulse sequences of the brain and surrounding structures were obtained without intravenous contrast. Angiographic images of the Circle of Willis were obtained using MRA technique without intravenous contrast. Angiographic images of the neck were obtained using MRA technique without intravenous contrast. Carotid stenosis measurements (when applicable) are obtained utilizing NASCET criteria, using the distal internal carotid diameter as the denominator. COMPARISON:  Prior CT from 12/18/2021 as well as recent MRI from 11/10/2021. FINDINGS: MRI HEAD FINDINGS Brain: Cerebral volume stable, and within normal limits for age. Multifocal patchy and confluent T2/FLAIR hyperintensity involving the periventricular and deep white matter both cerebral hemispheres, with additional  prominent involvement of the pons, most consistent with chronic small vessel ischemic disease, advanced for age. Multiple superimposed remote lacunar infarcts present about the bilateral basal ganglia, thalami, pons, and cerebellum. 1.9 cm acute ischemic infarct seen involving the deep white matter of the posterior right frontal centrum semi ovale (series 5, image 85). No associated hemorrhage or mass effect. No other evidence for acute or subacute ischemia. Previously seen small infarct involving the right midbrain has resolved. No acute intracranial hemorrhage. Multiple chronic micro hemorrhages noted clustered about the brainstem and thalami, consistent with chronic poorly controlled hypertension. No mass lesion, midline shift or mass effect. No hydrocephalus or extra-axial fluid collection. Pituitary gland and suprasellar region within normal limits. Vascular: Major intracranial vascular flow voids are maintained. Skull and upper cervical spine: Craniocervical junction within normal limits. Postsurgical changes partially visualize within the cervical spine. Bone marrow signal intensity normal. Small lipoma noted at the right frontal scalp. Scalp soft tissues demonstrate no acute finding. Sinuses/Orbits: Prior ocular lens replacement on the left. Globes and orbital soft tissues demonstrate no acute finding. Scattered mucosal thickening noted about the ethmoidal air cells and sphenoid sinuses. Paranasal sinuses are otherwise clear. No significant mastoid effusion. Other: None. MRA HEAD FINDINGS ANTERIOR CIRCULATION: Both internal carotid arteries patent to the termini without stenosis or other abnormality. A1 segments patent bilaterally. Normal anterior compartment complex. Anterior cerebral arteries widely patent to their distal aspects. No M1 stenosis or occlusion. No proximal MCA branch occlusion. Distal MCA branches perfused and symmetric. POSTERIOR CIRCULATION: Visualized distal V4 segments patent  bilaterally. Left vertebral artery dominant. Partially visualized PICA are patent. Basilar patent to its distal aspect without stenosis. Superior cerebellar arteries patent bilaterally. Both PCAs primarily supplied via the basilar well perfused or distal aspects. Small left posterior communicating artery noted. No intracranial aneurysm. MRA NECK FINDINGS AORTIC ARCH: Aortic arch and origin of the great vessels not evaluated or seen on this noncontrast examination. RIGHT CAROTID SYSTEM: Visualized right common and internal carotid arteries are patent without stenosis or evidence for dissection. No significant atheromatous narrowing about the right carotid bulb. LEFT CAROTID SYSTEM: Visualized left common and internal carotid arteries patent without stenosis or evidence for dissection. No significant atheromatous narrowing about the left carotid bulb. VERTEBRAL ARTERIES: Partially visualized vertebral arteries patent without stenosis or evidence  for dissection. Left vertebral artery dominant. Other: None. IMPRESSION: MRI HEAD: 1. 1.9 cm acute ischemic nonhemorrhagic infarct involving the deep white matter of the posterior right frontal centrum semi ovale. 2. Underlying advanced chronic microvascular ischemic disease with multiple remote lacunar infarcts involving the bilateral basal ganglia, thalami, pons, and cerebellum. 3. Multiple chronic micro hemorrhages clustered about the brainstem and thalami, consistent with chronic poorly controlled hypertension. MRA HEAD: Normal intracranial MRA. No large vessel occlusion or hemodynamically significant stenosis. MRA NECK: Normal MRA of the neck. Electronically Signed   By: Jeannine Boga M.D.   On: 12/19/2021 22:04   MR ANGIO HEAD WO CONTRAST  Result Date: 12/19/2021 CLINICAL DATA:  Initial evaluation for neuro deficit, stroke suspected. EXAM: MRI HEAD WITHOUT CONTRAST MRA HEAD WITHOUT CONTRAST MRA NECK WITHOUT CONTRAST TECHNIQUE: Multiplanar, multiecho pulse  sequences of the brain and surrounding structures were obtained without intravenous contrast. Angiographic images of the Circle of Willis were obtained using MRA technique without intravenous contrast. Angiographic images of the neck were obtained using MRA technique without intravenous contrast. Carotid stenosis measurements (when applicable) are obtained utilizing NASCET criteria, using the distal internal carotid diameter as the denominator. COMPARISON:  Prior CT from 12/18/2021 as well as recent MRI from 11/10/2021. FINDINGS: MRI HEAD FINDINGS Brain: Cerebral volume stable, and within normal limits for age. Multifocal patchy and confluent T2/FLAIR hyperintensity involving the periventricular and deep white matter both cerebral hemispheres, with additional prominent involvement of the pons, most consistent with chronic small vessel ischemic disease, advanced for age. Multiple superimposed remote lacunar infarcts present about the bilateral basal ganglia, thalami, pons, and cerebellum. 1.9 cm acute ischemic infarct seen involving the deep white matter of the posterior right frontal centrum semi ovale (series 5, image 85). No associated hemorrhage or mass effect. No other evidence for acute or subacute ischemia. Previously seen small infarct involving the right midbrain has resolved. No acute intracranial hemorrhage. Multiple chronic micro hemorrhages noted clustered about the brainstem and thalami, consistent with chronic poorly controlled hypertension. No mass lesion, midline shift or mass effect. No hydrocephalus or extra-axial fluid collection. Pituitary gland and suprasellar region within normal limits. Vascular: Major intracranial vascular flow voids are maintained. Skull and upper cervical spine: Craniocervical junction within normal limits. Postsurgical changes partially visualize within the cervical spine. Bone marrow signal intensity normal. Small lipoma noted at the right frontal scalp. Scalp soft  tissues demonstrate no acute finding. Sinuses/Orbits: Prior ocular lens replacement on the left. Globes and orbital soft tissues demonstrate no acute finding. Scattered mucosal thickening noted about the ethmoidal air cells and sphenoid sinuses. Paranasal sinuses are otherwise clear. No significant mastoid effusion. Other: None. MRA HEAD FINDINGS ANTERIOR CIRCULATION: Both internal carotid arteries patent to the termini without stenosis or other abnormality. A1 segments patent bilaterally. Normal anterior compartment complex. Anterior cerebral arteries widely patent to their distal aspects. No M1 stenosis or occlusion. No proximal MCA branch occlusion. Distal MCA branches perfused and symmetric. POSTERIOR CIRCULATION: Visualized distal V4 segments patent bilaterally. Left vertebral artery dominant. Partially visualized PICA are patent. Basilar patent to its distal aspect without stenosis. Superior cerebellar arteries patent bilaterally. Both PCAs primarily supplied via the basilar well perfused or distal aspects. Small left posterior communicating artery noted. No intracranial aneurysm. MRA NECK FINDINGS AORTIC ARCH: Aortic arch and origin of the great vessels not evaluated or seen on this noncontrast examination. RIGHT CAROTID SYSTEM: Visualized right common and internal carotid arteries are patent without stenosis or evidence for dissection. No significant atheromatous narrowing about  the right carotid bulb. LEFT CAROTID SYSTEM: Visualized left common and internal carotid arteries patent without stenosis or evidence for dissection. No significant atheromatous narrowing about the left carotid bulb. VERTEBRAL ARTERIES: Partially visualized vertebral arteries patent without stenosis or evidence for dissection. Left vertebral artery dominant. Other: None. IMPRESSION: MRI HEAD: 1. 1.9 cm acute ischemic nonhemorrhagic infarct involving the deep white matter of the posterior right frontal centrum semi ovale. 2.  Underlying advanced chronic microvascular ischemic disease with multiple remote lacunar infarcts involving the bilateral basal ganglia, thalami, pons, and cerebellum. 3. Multiple chronic micro hemorrhages clustered about the brainstem and thalami, consistent with chronic poorly controlled hypertension. MRA HEAD: Normal intracranial MRA. No large vessel occlusion or hemodynamically significant stenosis. MRA NECK: Normal MRA of the neck. Electronically Signed   By: Jeannine Boga M.D.   On: 12/19/2021 22:04   MR ANGIO NECK WO CONTRAST  Result Date: 12/19/2021 CLINICAL DATA:  Initial evaluation for neuro deficit, stroke suspected. EXAM: MRI HEAD WITHOUT CONTRAST MRA HEAD WITHOUT CONTRAST MRA NECK WITHOUT CONTRAST TECHNIQUE: Multiplanar, multiecho pulse sequences of the brain and surrounding structures were obtained without intravenous contrast. Angiographic images of the Circle of Willis were obtained using MRA technique without intravenous contrast. Angiographic images of the neck were obtained using MRA technique without intravenous contrast. Carotid stenosis measurements (when applicable) are obtained utilizing NASCET criteria, using the distal internal carotid diameter as the denominator. COMPARISON:  Prior CT from 12/18/2021 as well as recent MRI from 11/10/2021. FINDINGS: MRI HEAD FINDINGS Brain: Cerebral volume stable, and within normal limits for age. Multifocal patchy and confluent T2/FLAIR hyperintensity involving the periventricular and deep white matter both cerebral hemispheres, with additional prominent involvement of the pons, most consistent with chronic small vessel ischemic disease, advanced for age. Multiple superimposed remote lacunar infarcts present about the bilateral basal ganglia, thalami, pons, and cerebellum. 1.9 cm acute ischemic infarct seen involving the deep white matter of the posterior right frontal centrum semi ovale (series 5, image 85). No associated hemorrhage or mass  effect. No other evidence for acute or subacute ischemia. Previously seen small infarct involving the right midbrain has resolved. No acute intracranial hemorrhage. Multiple chronic micro hemorrhages noted clustered about the brainstem and thalami, consistent with chronic poorly controlled hypertension. No mass lesion, midline shift or mass effect. No hydrocephalus or extra-axial fluid collection. Pituitary gland and suprasellar region within normal limits. Vascular: Major intracranial vascular flow voids are maintained. Skull and upper cervical spine: Craniocervical junction within normal limits. Postsurgical changes partially visualize within the cervical spine. Bone marrow signal intensity normal. Small lipoma noted at the right frontal scalp. Scalp soft tissues demonstrate no acute finding. Sinuses/Orbits: Prior ocular lens replacement on the left. Globes and orbital soft tissues demonstrate no acute finding. Scattered mucosal thickening noted about the ethmoidal air cells and sphenoid sinuses. Paranasal sinuses are otherwise clear. No significant mastoid effusion. Other: None. MRA HEAD FINDINGS ANTERIOR CIRCULATION: Both internal carotid arteries patent to the termini without stenosis or other abnormality. A1 segments patent bilaterally. Normal anterior compartment complex. Anterior cerebral arteries widely patent to their distal aspects. No M1 stenosis or occlusion. No proximal MCA branch occlusion. Distal MCA branches perfused and symmetric. POSTERIOR CIRCULATION: Visualized distal V4 segments patent bilaterally. Left vertebral artery dominant. Partially visualized PICA are patent. Basilar patent to its distal aspect without stenosis. Superior cerebellar arteries patent bilaterally. Both PCAs primarily supplied via the basilar well perfused or distal aspects. Small left posterior communicating artery noted. No intracranial aneurysm. MRA NECK FINDINGS  AORTIC ARCH: Aortic arch and origin of the great vessels  not evaluated or seen on this noncontrast examination. RIGHT CAROTID SYSTEM: Visualized right common and internal carotid arteries are patent without stenosis or evidence for dissection. No significant atheromatous narrowing about the right carotid bulb. LEFT CAROTID SYSTEM: Visualized left common and internal carotid arteries patent without stenosis or evidence for dissection. No significant atheromatous narrowing about the left carotid bulb. VERTEBRAL ARTERIES: Partially visualized vertebral arteries patent without stenosis or evidence for dissection. Left vertebral artery dominant. Other: None. IMPRESSION: MRI HEAD: 1. 1.9 cm acute ischemic nonhemorrhagic infarct involving the deep white matter of the posterior right frontal centrum semi ovale. 2. Underlying advanced chronic microvascular ischemic disease with multiple remote lacunar infarcts involving the bilateral basal ganglia, thalami, pons, and cerebellum. 3. Multiple chronic micro hemorrhages clustered about the brainstem and thalami, consistent with chronic poorly controlled hypertension. MRA HEAD: Normal intracranial MRA. No large vessel occlusion or hemodynamically significant stenosis. MRA NECK: Normal MRA of the neck. Electronically Signed   By: Jeannine Boga M.D.   On: 12/19/2021 22:04   CT Head Wo Contrast  Result Date: 12/18/2021 CLINICAL DATA:  Left arm weakness for 2 days, initial encounter EXAM: CT HEAD WITHOUT CONTRAST TECHNIQUE: Contiguous axial images were obtained from the base of the skull through the vertex without intravenous contrast. RADIATION DOSE REDUCTION: This exam was performed according to the departmental dose-optimization program which includes automated exposure control, adjustment of the mA and/or kV according to patient size and/or use of iterative reconstruction technique. COMPARISON:  11/10/2021 FINDINGS: Brain: Scattered hypodensities are noted within the pons and thalami consistent with prior lacunar infarcts  stable from prior MRI. No acute hemorrhage or acute infarction is seen. No space occupying mass lesion is seen. Scattered chronic white matter ischemic changes are also seen. Vascular: No hyperdense vessel or unexpected calcification. Skull: Normal. Negative for fracture or focal lesion. Sinuses/Orbits: No acute finding. Other: None. IMPRESSION: Chronic ischemic changes without acute abnormality. Electronically Signed   By: Inez Catalina M.D.   On: 12/18/2021 21:33    Microbiology: No results found for this or any previous visit (from the past 240 hour(s)).   Labs: Basic Metabolic Panel: Recent Labs  Lab 12/18/21 1822 12/18/21 1834 12/19/21 1810 12/20/21 0744 12/22/21 0210 12/23/21 0314  NA 144 146*  --  143 141 141  K 3.4* 3.5  --  3.4* 3.6 3.3*  CL 111 111  --  108 107 107  CO2 23  --   --  25 24 27   GLUCOSE 117* 117*  --  109* 88 101*  BUN 41* 44*  --  42* 42* 44*  CREATININE 3.12* 3.20*  --  3.20* 3.18* 3.72*  CALCIUM 9.2  --   --  9.4 9.2 9.1  MG  --   --  2.1  --  2.0 2.1  PHOS  --   --   --   --  4.7* 5.1*   Liver Function Tests: Recent Labs  Lab 12/18/21 1822 12/20/21 0744 12/22/21 0210 12/23/21 0314  AST 15 17 16 15   ALT 15 13 11 15   ALKPHOS 46 42 39 43  BILITOT 0.5 0.4 0.5 0.5  PROT 6.8 6.4* 6.2* 6.3*  ALBUMIN 3.6 3.5 3.4* 3.3*   No results for input(s): "LIPASE", "AMYLASE" in the last 168 hours. No results for input(s): "AMMONIA" in the last 168 hours. CBC: Recent Labs  Lab 12/18/21 1822 12/18/21 1834 12/20/21 0744 12/22/21 0210 12/23/21 3419  WBC 5.4  --  4.3 5.4 5.1  NEUTROABS 2.7  --  1.5* 2.2 1.9  HGB 9.1* 9.5* 9.4* 8.6* 8.5*  HCT 28.1* 28.0* 28.7* 25.9* 25.6*  MCV 90.1  --  88.6 86.9 88.3  PLT 205  --  219 198 182   Cardiac Enzymes: No results for input(s): "CKTOTAL", "CKMB", "CKMBINDEX", "TROPONINI" in the last 168 hours. BNP: BNP (last 3 results) No results for input(s): "BNP" in the last 8760 hours.  ProBNP (last 3 results) No results  for input(s): "PROBNP" in the last 8760 hours.  CBG: Recent Labs  Lab 12/18/21 2117  GLUCAP 105*       Signed:  Fayrene Helper MD.  [facility] 12/23/2021, 2:50 PM

## 2021-12-23 NOTE — Plan of Care (Signed)
  Problem: Consults Goal: RH STROKE PATIENT EDUCATION Description: See Patient Education module for education specifics  Outcome: Progressing   Problem: RH SAFETY Goal: RH STG ADHERE TO SAFETY PRECAUTIONS W/ASSISTANCE/DEVICE Description: STG Adhere to Safety Precautions With cues Assistance/Device. Outcome: Progressing   Problem: RH KNOWLEDGE DEFICIT Goal: RH STG INCREASE KNOWLEDGE OF DIABETES Description: Patient and family will be able to manage DM with dietary modifications using handouts and educ material independently Outcome: Progressing Goal: RH STG INCREASE KNOWLEDGE OF HYPERTENSION Description: Patient and family will be able to manage HTN  with meds and dietary modifications using handouts and educ material independently Outcome: Progressing Goal: RH STG INCREASE KNOWLEGDE OF HYPERLIPIDEMIA Description: Patient and family will be able to manage HLD  with meds and dietary modifications using handouts and educ material independently Outcome: Progressing Goal: RH STG INCREASE KNOWLEDGE OF STROKE PROPHYLAXIS Description: Patient and family will be able to manage secondary risks with meds and dietary modifications using handouts and educ material independently Outcome: Progressing

## 2021-12-23 NOTE — Progress Notes (Signed)
Signed     PMR Admission Coordinator Pre-Admission Assessment   Patient: Christina Orozco is an 51 y.o., female MRN: [id number] DOB: April 22, 1971 Height: '5\' 2"'  (157.5 cm) Weight: 68.5 kg   Insurance Information HMO:    PPO:      PCP:      IPA:      80/20:      OTHER: Group [id number] PRIMARY: BCBS [place]      Policy#: [id number]      Subscriber: Self CM Name: Quentin Ore     Phone#: [phone]     Fax#: [phone] Pre-Cert#: [id number] auth for 7 days from 12/23/21 to 12/30/21       Employer: Not employed Benefits:  Phone #: [phone]     Name: Online verification Eff Date: 12/11/2019 - 02/09/2022 Deductible: $0 - does not have OOP Max: $0 - does not have CIR: 100% coverage SNF: 100% coverage Outpatient: $4 co-pay; limited to 27 visits/cal yr  Home Health: 100% coverage; limited by medical necessity review DME: 100% coverage; limited by medical necessity review  SECONDARY:       Policy#:      Phone#:    Development worker, community:       Phone#:    The Therapist, art Information Summary" for patients in Inpatient Rehabilitation Facilities with attached "Privacy Act [place] Records" was provided and verbally reviewed with: N/A   Emergency Contact Information Contact Information       Name Relation Home Work Mobile    [place],[name] Brother [place] Daughter     [phone]           Current Medical History  Patient Admitting Diagnosis: R CVA   History of Present Illness:  Christina Orozco is a 51 y.o. female with medical history significant of CKD stage IV, asthma, anxiety and depression, T2DM, HTN, GERD, PVD, hx of CVA on DAPT who presented to [facility] ED on 7/10 with complaints of left sided weakness and vision changes. She was also hospitalized in June 2023 for possible CVA. MRI  at that time showed possible small right cerebral peduncle infarcts due to small vessel disease. She was discharged with instructions to take  Brilinta 34m BID for 30 days and then resume ASA and Plavix. [facility] switched her from Plavix to Brilinta 652mBID and ASA 8133mVitals and labs In the ED on 7/10 were as follows: bP: 149/91, HR 82, RR: 18, oxygen: 99%on RA  hgb: 9.1, potassium: 3.4, BUN: 41, creatinine: 3.12.  CT of the head revealed no acute finding. MRI of the head revealed .9 cm acute ischemic nonhemorrhagic infarct involving the deep  white matter of the posterior right frontal centrum semi ovale. Pt. Seen by PT and OT and they recommended CIR to assist return to PLOF. Patient is having a renal US [place] and then will admit to CIR today.   Complete NIHSS TOTAL: 2   [facility] rehabilitation admission coordinator and physician.   Past Medical History      Past Medical History:  Diagnosis Date   Anemia      severe   Anxiety     Asthma     CKD (chronic kidney disease)      stage IV   DDD (degenerative disc disease), cervical     Depression     Diabetes [place]  Dislocated shoulder      right   GERD (gastroesophageal reflux disease)     Headache      migraines (about once a month)   History of kidney stones     Hypertension     Neuropathy     Peripheral vascular disease ([place])     Pneumonia     Stroke [facility])        Has the patient had major surgery during 100 days prior to admission? Yes   Family History   family history includes Breast cancer (age of onset: 49) in her sister; CAD in her mother; Cancer in an other family member; Diabetes in an other family member; Heart disease in an other family member; Heart failure in her mother; Seizures in her maternal uncle; Vascular Disease in her mother.   Current Medications   Current Facility-Administered Medications:    acetaminophen (TYLENOL) tablet 650 mg, 650 mg, Oral, Q4H PRN, Wynona Dove A, DO, 650 mg at 12/19/21 2216    albuterol (PROVENTIL) (2.5 MG/3ML) 0.083% nebulizer solution 2.5 mg, 2.5 mg, Inhalation, Q6H PRN, Orma Flaming, MD   amLODipine (NORVASC) tablet 5 mg, 5 mg, Oral, Daily, Roslynn Amble, Ellwood Dense, MD, 5 mg at 12/23/21 1024   aspirin EC tablet 81 mg, 81 mg, Oral, Daily, Orma Flaming, MD, 81 mg at 12/23/21 1024   atorvastatin (LIPITOR) tablet 80 mg, 80 mg, Oral, QHS, Lucrezia Starch, MD, 80 mg at 12/22/21 2102   carvedilol (COREG) tablet 12.5 mg, 12.5 mg, Oral, BID WC, Lucrezia Starch, MD, 12.5 mg at 12/23/21 0803   DULoxetine (CYMBALTA) DR capsule 60 mg, 60 mg, Oral, QHS, Orma Flaming, MD, 60 mg at 12/22/21 2102   fluticasone (FLONASE) 50 MCG/ACT nasal spray 2 spray, 2 spray, Each Nare, Daily, Orma Flaming, MD, 2 spray at 12/23/21 1026   montelukast (SINGULAIR) tablet 10 mg, 10 mg, Oral, QHS, Orma Flaming, MD, 10 mg at 12/22/21 2102   ondansetron (ZOFRAN-ODT) disintegrating tablet 4 mg, 4 mg, Oral, Q8H PRN, Elodia Florence., MD, 4 mg at 12/22/21 0840   pantoprazole (PROTONIX) EC tablet 40 mg, 40 mg, Oral, Daily, Orma Flaming, MD, 40 mg at 12/23/21 1024   polyvinyl alcohol (LIQUIFILM TEARS) 1.4 % ophthalmic solution 1 drop, 1 drop, Both Eyes, Daily PRN, Madueme, Elvira C, RPH   senna-docusate (Senokot-S) tablet 1 tablet, 1 tablet, Oral, QHS PRN, Orma Flaming, MD, 1 tablet at 12/20/21 2143   ticagrelor (BRILINTA) tablet 60 mg, 60 mg, Oral, BID, Orma Flaming, MD, 60 mg at 12/23/21 1024   traZODone (DESYREL) tablet 50 mg, 50 mg, Oral, QHS PRN, Lucrezia Starch, MD, 50 mg at 12/22/21 2106   Patients Current Diet:  Diet Order                  Diet Heart Room service appropriate? Yes; Fluid consistency: Thin  Diet effective now                         Precautions / Restrictions Precautions Precautions: Fall Precaution Comments: fall last month Restrictions Weight Bearing Restrictions: No    Has the patient had 2 or more falls or a fall with injury in the past year?  No   Prior Activity Level Community (5-7x/wk): Pt. active in the community PTA   Prior Functional Level Self Care: Did the patient need help bathing, dressing, using the toilet or eating? Independent   Indoor Mobility: Did the patient need assistance  with walking from room to room (with or without device)? Independent   Stairs: Did the patient need assistance with internal or external stairs (with or without device)? Independent   Functional Cognition: Did the patient need help planning regular tasks such as shopping or remembering to take medications? Independent   Patient Information Are you of Hispanic, Latino/a,or Spanish origin?: A. No, not of Hispanic, Latino/a, or Spanish origin What is your race?: B. Black or African American Do you need or want an interpreter to communicate with a doctor or health care staff?: 0. No   Patient's Response To:  Health Literacy and Transportation Is the patient able to respond to health literacy and transportation needs?: Yes Health Literacy - How often do you need to have someone help you when you read instructions, pamphlets, or other written material from your doctor or pharmacy?: Never In the past 12 months, has lack of transportation kept you from medical appointments or from getting medications?: No In the past 12 months, has lack of transportation kept you from meetings, work, or from getting things needed for daily living?: No   Home Assistive Devices / [place] Devices/Equipment: Radio producer (specify quad or straight), Eyeglasses, Grab bars around toilet, Grab bars in shower, Hand-held shower hose, Built-in shower seat (straight cane) Home Equipment: Conservation officer, nature (2 wheels), [employer] - single point, Shower seat, Grab bars - tub/shower, Hand held shower head   Prior Device Use: Indicate devices/aids used by the patient prior to current illness, exacerbation or injury? Walker   Current Functional Level Cognition   Overall  Cognitive Status: Impaired/Different from baseline Current Attention Level: Sustained Orientation Level: Oriented X4 Following Commands: Follows one step commands with increased time General Comments: Pt states no left sided deficits following prior CVA last month, however there was notable loss of Lt sided function based on notes/assessments from prior admission.    Extremity Assessment (includes Sensation/Coordination)   Upper Extremity Assessment: LUE deficits/detail LUE Deficits / Details: AAROM, shoulder flexion 80* (prior rotator cuff tear), elbow WFL, wrist flexion/extension WFL, all movements are slow and controlled. Able to perform digit opposition at decreased speed LUE Sensation: WNL LUE Coordination: decreased fine motor, decreased gross motor  Lower Extremity Assessment: Defer to PT evaluation LLE Deficits / Details: residual deficits from prior CVA, AROM, strength grossly 3/5, apraxic at times with left ankle DF. LLE Sensation: decreased light touch, decreased proprioception LLE Coordination: decreased fine motor, decreased gross motor     ADLs   Overall ADL's : Needs assistance/impaired Grooming: Min guard, Standing Grooming Details (indicate cue type and reason): increased time for fine motor aspects of task and depth perception difficulty aligning toothpaste cap with tube Upper Body Bathing: Sitting, Standing, Moderate assistance Lower Body Bathing: Sitting/lateral leans, Sit to/from stand, Moderate assistance Upper Body Dressing : Minimal assistance, Standing, Sitting Lower Body Dressing: Min guard, Sitting/lateral leans Lower Body Dressing Details (indicate cue type and reason): uses bil hands to don socks Toilet Transfer: Min guard, Rolling walker (2 wheels), Ambulation, Regular Toilet Toilet Transfer Details (indicate cue type and reason): simulated in room Toileting- Clothing Manipulation and Hygiene: Minimal assistance, Sitting/lateral lean, Sit to/from  stand Functional mobility during ADLs: Min guard, Rolling walker (2 wheels)     Mobility   Overal bed mobility: Needs Assistance Bed Mobility: Sidelying to Sit, Sit to Sidelying Sidelying to sit: Min guard Sit to sidelying: Min guard General bed mobility comments: Min guard for safety     Transfers   Overall transfer level:  Needs assistance Equipment used: 1 person hand held assist Transfers: Sit to/from Stand Sit to Stand: Min assist General transfer comment: Min A for steadying. Performed sit to stand X5 with emphasis on putting weight through LLE for strengthening.     Ambulation / Gait / Stairs / Wheelchair Mobility   Ambulation/Gait Ambulation/Gait assistance: Min assist, Mod assist Gait Distance (Feet): 50 Feet Assistive device: 1 person hand held assist Gait Pattern/deviations: Step-through pattern, Decreased stance time - left, Decreased step length - right, Ataxic, Drifts right/left, Decreased dorsiflexion - left General Gait Details: Min up to mod A for stability when ambulating without AD. Continued to note functional weakness in LLE, however, ataxia had improved. Pt reporting dizziness, so further mobility deferred. Gait velocity: Decreased Gait velocity interpretation: <1.31 ft/sec, indicative of household ambulator     Posture / Balance Dynamic Sitting Balance Sitting balance - Comments: can perform figure 4 for LB dressing Balance Overall balance assessment: Needs assistance Sitting-balance support: No upper extremity supported, Feet supported Sitting balance-Leahy Scale: Fair Sitting balance - Comments: can perform figure 4 for LB dressing Postural control: Posterior lean Standing balance support: Single extremity supported Standing balance-Leahy Scale: Poor Standing balance comment: Reliant on UE and external support     Special needs/care consideration Skin Intact    Previous Home Environment (from acute therapy documentation) Living Arrangements:  Children  Lives With: Spouse Available Help at Discharge: Available PRN/intermittently Type of Home: Apartment Home Layout: One level Home Access: Level entry Bathroom Shower/Tub: Chiropodist: Standard Bathroom Accessibility: No Home Care Services: Yes Type of Home Care Services: [place] (if known): [place] Family Additional Comments: daughter is home during summer from college, and weekends when she is in school   Discharge Living Setting Plans for Discharge Living Setting: Patient's home Type of Home at Discharge: Apartment Discharge Home Layout: One level Discharge Home Access: Level entry Discharge Bathroom Shower/Tub: Tub/shower unit Discharge Bathroom Toilet: Standard Discharge Bathroom Accessibility: Yes How Accessible: Accessible via walker, Accessible via wheelchair Does the patient have any problems obtaining your medications?: No   Social/Family/Support Systems Patient Roles: Other (Comment) Contact Information: [phone] Anticipated Caregiver: Anitraous Gil Ability/Limitations of Caregiver: Min A Caregiver Availability: 24/7   Goals Patient/Family Goal for Rehab: PT OT supervision Expected length of stay: 7-10 days Pt/Family Agrees to Admission and willing to participate: Yes Program Orientation Provided & Reviewed with Pt/Caregiver Including Roles  & Responsibilities: Yes   Decrease burden of Care through IP rehab admission: n/a   Possible need for SNF placement upon discharge: not anticipated    Patient Condition: I have reviewed medical records from [facility] , spoken with CM, and patient. I met with patient at the bedside for inpatient rehabilitation assessment.  Patient will benefit from ongoing PT and OT, can actively participate in 3 hours of therapy a day 5 days of the week, and can make measurable gains during the admission.  Patient will also benefit from the coordinated team approach  during an Inpatient Acute Rehabilitation admission.  The patient will receive intensive therapy as well as Rehabilitation physician, nursing, social worker, and care management interventions.  Due to safety, skin/wound care, disease management, medication administration, pain management, and patient education the patient requires 24 hour a day rehabilitation nursing.  The patient is currently min guard to mod assist with mobility and basic ADLs.  Discharge setting and therapy post discharge at home with home health is anticipated.  Patient has agreed to participate  in the Acute Inpatient Rehabilitation Program and will admit today.   Preadmission Screen Completed By:  Retta Diones, 12/21/2021 12:08 PM ______________________________________________________________________   Discussed status with Dr. Ranell Patrick on 12/23/21 at 1000 and received approval for admission today.   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 Yes Co-Morbidities requiring supervision/potential complications: history of stroke, history of seizure, HTN, type 2 diabetes, stage 4 CKD, anxiety, GERD Due to bladder management, bowel management, safety, skin/wound care, disease management, medication administration, pain management, and patient education, does the patient require 24 hr/day rehab nursing? Yes Does the patient require coordinated care of a physician, rehab nurse, PT, OT, and SLP to address physical and functional deficits in the context of the above medical diagnosis(es)? Yes Addressing deficits in the following areas: balance, endurance, locomotion, strength, transferring, bowel/bladder control, bathing, dressing, feeding, grooming,  toileting, and psychosocial support Can the patient actively participate in an intensive therapy program of at least 3 hrs of therapy 5 days a week? Yes The potential for patient to make measurable gains while on inpatient rehab is excellent Anticipated functional outcomes upon discharge from inpatient rehab: modified independent PT, modified independent OT, modified independent SLP Estimated rehab length of stay to reach the above functional goals is: 7-9 days Anticipated discharge destination: Home 10. Overall Rehab/Functional Prognosis: excellent     MD Signature: Leeroy Cha, MD

## 2021-12-24 LAB — COMPREHENSIVE METABOLIC PANEL
ALT: 14 U/L (ref 0–44)
AST: 14 U/L — ABNORMAL LOW (ref 15–41)
Albumin: 3.2 g/dL — ABNORMAL LOW (ref 3.5–5.0)
Alkaline Phosphatase: 42 U/L (ref 38–126)
Anion gap: 6 (ref 5–15)
BUN: 47 mg/dL — ABNORMAL HIGH (ref 6–20)
CO2: 26 mmol/L (ref 22–32)
Calcium: 9.4 mg/dL (ref 8.9–10.3)
Chloride: 109 mmol/L (ref 98–111)
Creatinine, Ser: 3.33 mg/dL — ABNORMAL HIGH (ref 0.44–1.00)
GFR, Estimated: 16 mL/min — ABNORMAL LOW (ref >60)
Glucose, Bld: 97 mg/dL (ref 70–99)
Potassium: 3.9 mmol/L (ref 3.5–5.1)
Sodium: 141 mmol/L (ref 135–145)
Total Bilirubin: 0.6 mg/dL (ref 0.3–1.2)
Total Protein: 6.1 g/dL — ABNORMAL LOW (ref 6.5–8.1)

## 2021-12-24 LAB — CBC WITH DIFFERENTIAL/PLATELET
Abs Immature Granulocytes: 0.01 10*3/uL (ref 0.00–0.07)
Basophils Absolute: 0 10*3/uL (ref 0.0–0.1)
Basophils Relative: 1 %
Eosinophils Absolute: 0.1 10*3/uL (ref 0.0–0.5)
Eosinophils Relative: 3 %
HCT: 27.2 % — ABNORMAL LOW (ref 36.0–46.0)
Hemoglobin: 8.9 g/dL — ABNORMAL LOW (ref 12.0–15.0)
Immature Granulocytes: 0 %
Lymphocytes Relative: 54 %
Lymphs Abs: 2.9 10*3/uL (ref 0.7–4.0)
MCH: 29.3 pg (ref 26.0–34.0)
MCHC: 32.7 g/dL (ref 30.0–36.0)
MCV: 89.5 fL (ref 80.0–100.0)
Monocytes Absolute: 0.3 10*3/uL (ref 0.1–1.0)
Monocytes Relative: 5 %
Neutro Abs: 2 10*3/uL (ref 1.7–7.7)
Neutrophils Relative %: 37 %
Platelets: 197 10*3/uL (ref 150–400)
RBC: 3.04 MIL/uL — ABNORMAL LOW (ref 3.87–5.11)
RDW: 13.2 % (ref 11.5–15.5)
WBC: 5.3 10*3/uL (ref 4.0–10.5)
nRBC: 0 % (ref 0.0–0.2)
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
## 2021-12-24 NOTE — Plan of Care (Signed)
  Problem: RH Balance Goal: LTG Patient will maintain dynamic standing balance (PT) Description: LTG:  Patient will maintain dynamic standing balance with assistance during mobility activities (PT) Flowsheets (Taken 12/24/2021 1753) LTG: Pt will maintain dynamic standing balance during mobility activities with:: Independent with assistive device    Problem: Sit to Stand Goal: LTG:  Patient will perform sit to stand with assistance level (PT) Description: LTG:  Patient will perform sit to stand with assistance level (PT) Flowsheets (Taken 12/24/2021 1753) LTG: PT will perform sit to stand in preparation for functional mobility with assistance level: Independent with assistive device   Problem: RH Bed Mobility Goal: LTG Patient will perform bed mobility with assist (PT) Description: LTG: Patient will perform bed mobility with assistance, with/without cues (PT). Flowsheets (Taken 12/24/2021 1753) LTG: Pt will perform bed mobility with assistance level of: Independent   Problem: RH Bed to Chair Transfers Goal: LTG Patient will perform bed/chair transfers w/assist (PT) Description: LTG: Patient will perform bed to chair transfers with assistance (PT). Flowsheets (Taken 12/24/2021 1753) LTG: Pt will perform Bed to Chair Transfers with assistance level: Independent with assistive device    Problem: RH Car Transfers Goal: LTG Patient will perform car transfers with assist (PT) Description: LTG: Patient will perform car transfers with assistance (PT). Flowsheets (Taken 12/24/2021 1753) LTG: Pt will perform car transfers with assist:: Supervision/Verbal cueing   Problem: RH Furniture Transfers Goal: LTG Patient will perform furniture transfers w/assist (OT/PT) Description: LTG: Patient will perform furniture transfers  with assistance (OT/PT). Flowsheets (Taken 12/24/2021 1753) LTG: Pt will perform furniture transfers with assist:: Independent with assistive device    Problem: RH  Ambulation Goal: LTG Patient will ambulate in controlled environment (PT) Description: LTG: Patient will ambulate in a controlled environment, # of feet with assistance (PT). Flowsheets (Taken 12/24/2021 1753) LTG: Pt will ambulate in controlled environ  assist needed:: Supervision/Verbal cueing LTG: Ambulation distance in controlled environment: >200 ft using LRAD Goal: LTG Patient will ambulate in home environment (PT) Description: LTG: Patient will ambulate in home environment, # of feet with assistance (PT). Flowsheets (Taken 12/24/2021 1753) LTG: Pt will ambulate in home environ  assist needed:: Independent with assistive device LTG: Ambulation distance in home environment: up to 50' using LRAD   Problem: RH Stairs Goal: LTG Patient will ambulate up and down stairs w/assist (PT) Description: LTG: Patient will ambulate up and down # of stairs with assistance (PT) Flowsheets (Taken 12/24/2021 1753) LTG: Pt will ambulate up/down stairs assist needed:: Supervision/Verbal cueing LTG: Pt will  ambulate up and down number of stairs: 1 curb step using RW

## 2021-12-24 NOTE — Evaluation (Signed)
Physical Therapy Assessment and Plan  Patient Details  Name: Christina Orozco MRN: [id number] Date of Birth: [birth year]  PT Diagnosis: Coordination disorder, Difficulty walking, Dizziness and giddiness, Hemiparesis non-dominant, and Muscle weakness Rehab Potential: Good ELOS: 8-10 days   Today's Date: 12/24/2021 PT Individual Time: 1448-1600 PT Individual Time Calculation (min): 72 min    Hospital Problem: Principal Problem:   Acute embolic stroke ([place])   Past Medical History:  Past Medical History:  Diagnosis Date   Anemia    severe   Anxiety    Asthma    CKD (chronic kidney disease)    stage IV   DDD (degenerative disc disease), cervical    Depression    Diabetes mellitus ([place])    Dislocated shoulder    right   GERD (gastroesophageal reflux disease)    Headache    migraines (about once a month)   History of kidney stones    Hypertension    Neuropathy    Peripheral vascular disease ([place])    Pneumonia    Stroke [facility])    Past Surgical History:  Past Surgical History:  Procedure Laterality Date   ADENOIDECTOMY     APPENDECTOMY     CERVICAL SPINE SURGERY  06/2012   C5-C6   EYE SURGERY     left eye surgery    FOOT SURGERY     KENALOG INJECTION Left 03/10/2021   Procedure: Intravitreal Avastin Injection;  Surgeon: Sherlynn Stalls, MD;  Location: [place];  Service: Ophthalmology;  Laterality: Left;   LOOP RECORDER INSERTION N/A 08/27/2017   Procedure: LOOP RECORDER INSERTION;  Surgeon: Sanda Klein, MD;  Location: [place] [facility];  Service: Cardiovascular;  Laterality: N/A;   MEMBRANE PEEL Left 03/10/2021   Procedure: MEMBRANE PEEL;  Surgeon: Sherlynn Stalls, MD;  Location: [place];  Service: Ophthalmology;  Laterality: Left;   PARS PLANA VITRECTOMY Left 03/10/2021   Procedure: TWENTY-FIVE GAUGE PARS PLANA VITRECTOMY;  Surgeon: Sherlynn Stalls, MD;  Location: [place];  Service: Ophthalmology;  Laterality: Left;   PHOTOCOAGULATION WITH LASER Left 03/10/2021   Procedure:  PHOTOCOAGULATION WITH LASER;  Surgeon: Sherlynn Stalls, MD;  Location: [place];  Service: Ophthalmology;  Laterality: Left;   SHOULDER SURGERY     TEE WITHOUT CARDIOVERSION N/A 08/27/2017   Procedure: TRANSESOPHAGEAL ECHOCARDIOGRAM (TEE);  Surgeon: Sanda Klein, MD;  Location: [facility];  Service: Cardiovascular;  Laterality: N/A;   TONSILLECTOMY      Assessment & Plan Clinical Impression: Patient is a 51 y.o. female with a history of multiple prior strokes who presented to the emergency department on 12/18/2021 complaining of left arm weakness, left leg weakness with dizziness and headache.  She reported decreased vision acuity described as blurriness in her vision which is her baseline.  She was outside the stroke alert window.  MRI of the brain, MRA of head and neck ordered.  The patient left the waiting room before this was completed and presented to [facility] where she was reevaluated.  CT scan of head consistent with chronic ischemic changes without acute abnormality.  Discussed with neurology, Dr. Leonel Ramsay.  Due to baseline poor kidney function, CTA was deferred.  She was transferred to [facility].  MRI was significant for right frontal centrum semiovale and small left pontine infarcts, again likely small vessel disease. Now on aspirin 81 mg daily and Brilinta 60 mg BID. Neuro to follow-up P2y12 level and consider dose adjustment on Brilinta. Tolerating regular diet. The patient requires inpatient physical medicine and rehabilitation evaluations and  treatment secondary to dysfunction due to right frontal central ovale and left pontine infarcts.   Recent history includes small right cerebral peduncle infarcts 11/10/2021.  She was discharged on dual antiplatelet therapy with aspirin 81 mg daily and Brilinta.    Patient transferred to CIR on 12/23/2021 .   Patient currently requires  CGA  with mobility secondary to muscle weakness, decreased cardiorespiratoy endurance, unbalanced muscle  activation and decreased coordination, decreased visual acuity and decreased visual motor skills, ,, decreased memory, vestibular impairments of unknown cause, and decreased standing balance, decreased balance strategies, and hemipareisis .  Prior to hospitalization, patient was modified independent  with mobility and lived with Daughter in a [place] home.  Home access is  Level entry.  Patient will benefit from skilled PT intervention to maximize safe functional mobility, minimize fall risk, and decrease caregiver burden for planned discharge home with intermittent assist.  Anticipate patient will benefit from follow up [facility] at discharge.  PT - End of Session Activity Tolerance: Tolerates 30+ min activity with multiple rests Endurance Deficit: Yes Endurance Deficit Description: minimal fatigue after ambulation and mobility assessments PT Assessment Rehab Potential (ACUTE/IP ONLY): Good PT Barriers to Discharge: Decreased caregiver support;Lack of/limited family support;Insurance for SNF coverage PT Patient demonstrates impairments in the following area(s): Balance;Endurance;Motor;Pain;Perception;Safety;Sensory PT Transfers Functional Problem(s): Bed Mobility;Bed to Chair;Car;Furniture PT Locomotion Functional Problem(s): Ambulation;Stairs PT Plan PT Intensity: Minimum of 1-2 x/day ,45 to 90 minutes PT Frequency: 5 out of 7 days PT Duration Estimated Length of Stay: 8-10 days PT Treatment/Interventions: Ambulation/gait training;Community reintegration;DME/adaptive equipment instruction;Neuromuscular re-education;Psychosocial support;Stair training;UE/LE Strength taining/ROM;Balance/vestibular training;Discharge planning;Functional electrical stimulation;Pain management;Therapeutic Activities;UE/LE Coordination activities;Visual/perceptual remediation/compensation;Therapeutic Exercise;Splinting/orthotics;Patient/family education;Functional mobility training;Cognitive remediation/compensation PT  Transfers Anticipated Outcome(s): Mod I PT Locomotion Anticipated Outcome(s): Mod I/ supervision PT Recommendation Recommendations for Other Services: Therapeutic Recreation consult Therapeutic Recreation Interventions: Stress management;Kitchen group Follow Up Recommendations: Home health PT;Outpatient PT Patient destination: Home Equipment Recommended: To be determined   PT Evaluation Precautions/Restrictions Precautions Precautions: Fall Precaution Comments: L hemipareisis, constant headache and neck pain, rotator cuff injury Restrictions Weight Bearing Restrictions: No General   Vital SignsTherapy Vitals Temp: 98.2 F (36.8 C) Pulse Rate: 78 Resp: 16 BP: 136/79 Patient Position (if appropriate): Lying Oxygen Therapy SpO2: 100 % O2 Device: Room Air Pain Pain Assessment Pain Scale: 0-10 Pain Score: 5  Pain Type: Acute pain Pain Location: Head Pain Orientation: Mid Pain Descriptors / Indicators: Throbbing Pain Onset: On-going Patients Stated Pain Goal: 0 Pain Intervention(s): Emotional support;Distraction Pain Interference Pain Interference Pain Effect on Sleep: 2. Occasionally Pain Interference with Therapy Activities: 1. Rarely or not at all Pain Interference with Day-to-Day Activities: 1. Rarely or not at all Home Living/Prior [place] Available Help at Discharge: Available PRN/intermittently (Home for the summer and available for 24-hour assist as needed (until August 17th), then will be available on weekends after going back to school.) Type of Home: Apartment Home Access: Level entry Home Layout: One level Bathroom Shower/Tub: Tub/shower unit;Curtain (grab bars) Bathroom Toilet: Handicapped height (has grab bars) Bathroom Accessibility: Yes Additional Comments: Has home health care Monday - Friday. Monday & Tuesday home health is there 3 hours & 2.75 Wedneday - Friday. Providing assistance with bathing (bathing back), laundry, and  housekeeping.  Lives With: Daughter Prior Function Level of Independence: Independent with basic ADLs;Needs assistance with homemaking;Independent with gait (ambulated with gait, requires assistance with bathing, and completed ADLs with modified independence)  Able to Take Stairs?: No Driving: No Vocation: Unemployed Vision/Perception  Vision - History  Ability to See in Adequate Light: 1 Impaired Vision - Assessment Ocular Range of Motion: Within Functional Limits Alignment/Gaze Preference: Within Defined Limits Tracking/Visual Pursuits: Decreased smoothness of horizontal tracking;Impaired - to be further tested in functional context;Able to track stimulus in all quads without difficulty Saccades: Additional eye shifts occurred during testing (with L eye) Convergence: Within functional limits Perception Perception: Impaired Praxis Praxis: Intact  Cognition Overall Cognitive Status: Within Functional Limits for tasks assessed Arousal/Alertness: Awake/alert Orientation Level: Oriented X4 Memory: Impaired Safety/Judgment: Impaired Sensation Sensation Light Touch: Appears Intact Coordination Gross Motor Movements are Fluid and Coordinated: No Fine Motor Movements are Fluid and Coordinated: No Heel Shin Test: undershoots bilaterally Motor  Motor Motor: Other (comment) (hemipareisis) Motor - Skilled Clinical Observations: decreased smooth movements, improving since admission   Trunk/Postural Assessment  Cervical Assessment Cervical Assessment: Within Functional Limits Thoracic Assessment Thoracic Assessment: Exceptions to [facility] (rounded shoulders) Lumbar Assessment Lumbar Assessment: Within Functional Limits Postural Control Postural Control: Within Functional Limits  Balance Balance Balance Assessed: Yes Standardized Balance Assessment Standardized Balance Assessment: Berg Balance Test;Functional Gait Assessment Berg Balance Test Sit to Stand: Able to stand using hands  after several tries Standing Unsupported: Able to stand 2 minutes with supervision Sitting with Back Unsupported but Feet Supported on Floor or Stool: Able to sit safely and securely 2 minutes Stand to Sit: Uses backs of legs against chair to control descent Transfers: Able to transfer with verbal cueing and /or supervision Standing Unsupported with Eyes Closed: Able to stand 10 seconds with supervision Standing Ubsupported with Feet Together: Able to place feet together independently and stand for 1 minute with supervision From Standing, Reach Forward with Outstretched Arm: Can reach forward >5 cm safely (2") From Standing Position, Pick up Object from Floor: Able to pick up shoe, needs supervision From Standing Position, Turn to Look Behind Over each Shoulder: Looks behind one side only/other side shows less weight shift Turn 360 Degrees: Needs assistance while turning (dizziness on turn to L in CCW direction) Standing Unsupported, Alternately Place Feet on Step/Stool: Able to complete >2 steps/needs minimal assist Standing Unsupported, One Foot in Front: Able to plae foot ahead of the other independently and hold 30 seconds Standing on One Leg: Unable to try or needs assist to prevent fall Total Score: 31 Static Sitting Balance Static Sitting - Balance Support: Feet supported;No upper extremity supported Static Sitting - Level of Assistance: 6: Modified independent (Device/Increase time) Dynamic Sitting Balance Dynamic Sitting - Balance Support: No upper extremity supported;Feet supported Dynamic Sitting - Level of Assistance: 6: Modified independent (Device/Increase time) Static Standing Balance Static Standing - Balance Support: During functional activity;Bilateral upper extremity supported Static Standing - Level of Assistance: Other (comment) (CGA) Dynamic Standing Balance Dynamic Standing - Balance Support: During functional activity;Bilateral upper extremity supported Dynamic  Standing - Level of Assistance: 4: Min assist Functional Gait  Assessment Gait assessed : Yes Gait Level Surface: Walks 20 ft in less than 7 sec but greater than 5.5 sec, uses assistive device, slower speed, mild gait deviations, or deviates 6-10 in outside of the 12 in walkway width. Change in Gait Speed: Makes only minor adjustments to walking speed, or accomplishes a change in speed with significant gait deviations, deviates 10-15 in outside the 12 in walkway width, or changes speed but loses balance but is able to recover and continue walking. Gait with Horizontal Head Turns: Performs head turns smoothly with slight change in gait velocity (eg, minor disruption to smooth gait path), deviates 6-10 in  outside 12 in walkway width, or uses an assistive device. Gait with Vertical Head Turns: Performs task with slight change in gait velocity (eg, minor disruption to smooth gait path), deviates 6 - 10 in outside 12 in walkway width or uses assistive device Gait and Pivot Turn: Cannot turn safely, requires assistance to turn and stop. Step Over Obstacle: Is able to step over one shoe box (4.5 in total height) but must slow down and adjust steps to clear box safely. May require verbal cueing. Gait with Narrow Base of Support: Ambulates less than 4 steps heel to toe or cannot perform without assistance. Gait with Eyes Closed: Walks 20 ft, uses assistive device, slower speed, mild gait deviations, deviates 6-10 in outside 12 in walkway width. Ambulates 20 ft in less than 9 sec but greater than 7 sec. Ambulating Backwards: Walks 20 ft, slow speed, abnormal gait pattern, evidence for imbalance, deviates 10-15 in outside 12 in walkway width. Steps: Two feet to a stair, must use rail. Total Score: 12 Extremity Assessment      RLE Assessment RLE Assessment: Within Functional Limits LLE Assessment LLE Assessment: Exceptions to [facility] LLE Strength LLE Overall Strength: Deficits Left Hip Flexion: 4-/5 Left Hip  Extension: 4-/5 Left Hip ABduction: 4/5 Left Hip ADduction: 4/5 Left Knee Flexion: 4-/5 Left Knee Extension: 4/5 Left Ankle Dorsiflexion: 4-/5 Left Ankle Plantar Flexion: 4/5  Care Tool Care Tool Bed Mobility Roll left and right activity   Roll left and right assist level: Supervision/Verbal cueing    Sit to lying activity   Sit to lying assist level: Supervision/Verbal cueing    Lying to sitting on side of bed activity   Lying to sitting on side of bed assist level: the ability to move from lying on the back to sitting on the side of the bed with no back support.: Supervision/Verbal cueing     Care Tool Transfers Sit to stand transfer   Sit to stand assist level: Contact Guard/Touching assist    Chair/bed transfer   Chair/bed transfer assist level: Contact Guard/Touching assist     Toilet transfer   Assist Level: Contact Guard/Touching assist    Geneticist, molecular transfer assist level: Contact Guard/Touching assist      Care Tool Locomotion Ambulation   Assist level: Contact Guard/Touching assist Assistive device: Walker-rolling Max distance: 180 ft  Walk 10 feet activity   Assist level: Supervision/Verbal cueing Assistive device: Walker-rolling   Walk 50 feet with 2 turns activity   Assist level: Contact Guard/Touching assist Assistive device: Walker-rolling  Walk 150 feet activity   Assist level: Contact Guard/Touching assist Assistive device: Walker-rolling  Walk 10 feet on uneven surfaces activity   Assist level: Contact Guard/Touching assist Assistive device: Walker-rolling  Stairs   Assist level: Contact Guard/Touching assist Stairs assistive device: Walker Max number of stairs: 1  Walk up/down 1 step activity   Walk up/down 1 step (curb) assist level: Contact Guard/Touching assist Walk up/down 1 step or curb assistive device: Walker  Walk up/down 4 steps activity Walk up/down 4 steps activity did not occur: Safety/medical concerns      Walk  up/down 12 steps activity Walk up/down 12 steps activity did not occur: Safety/medical concerns      Pick up small objects from floor   Pick up small object from the floor assist level: Contact Guard/Touching assist    Wheelchair Is the patient using a wheelchair?: No Type of Wheelchair: Manual Wheelchair activity did not occur: N/A  Wheel 50 feet with 2 turns activity Wheelchair 50 feet with 2 turns activity did not occur: N/A    Wheel 150 feet activity Wheelchair 150 feet activity did not occur: N/A      Refer to Care Plan for Long Term Goals  SHORT TERM GOAL WEEK 1 PT Short Term Goal 1 (Week 1): STG = LTG d/t ELOS  Recommendations for other services: Therapeutic Recreation  Kitchen group and Stress management  Skilled Therapeutic Intervention Mobility Bed Mobility Bed Mobility: Supine to Sit;Sit to Supine;Rolling Right;Rolling Left Rolling Right: Supervision/verbal cueing Rolling Left: Supervision/Verbal cueing Supine to Sit: Supervision/Verbal cueing Sit to Supine: Supervision/Verbal cueing Transfers Transfers: Sit to Stand;Stand to Sit;Stand Pivot Transfers Sit to Stand: Contact Guard/Touching assist Stand to Sit: Contact Guard/Touching assist Stand Pivot Transfers: Contact Guard/Touching assist Transfer (Assistive device): Rolling walker Locomotion  Gait Ambulation: Yes Gait Assistance: Contact Guard/Touching assist Gait Distance (Feet): 180 Feet Assistive device: Rolling walker Gait Gait: Yes Gait Pattern: Impaired Gait Pattern: Step-through pattern;Decreased step length - right;Decreased step length - left;Decreased hip/knee flexion - left;Decreased trunk rotation Gait velocity: Decreased Stairs / Additional Locomotion Stairs: Yes Stairs Assistance: Contact Guard/Touching assist Stair Management Technique: With walker Number of Stairs: 1 Height of Stairs: 5 Curb: Contact Guard/Touching assist Wheelchair Mobility Wheelchair Mobility: No  Skilled  Intervention: PT Evaluation completed; see above for results. PT educated patient in roles of PT vs OT, PT POC, rehab potential, rehab goals, and discharge recommendations along with recommendation for follow-up rehabilitation services. Individual treatment initiated:  Patient sidelying to L in bed upon PT arrival. Patient alert and agreeable to PT session. No pain complaint initially but pt later does relate consistent pain in back of head and neck since CVA onset and consistent L shoulder pain from rotator cuff injury for which she refuses surgical intervention. Also relates some dizziness with movements and irregular eye movement and positioning patterns since CVA.   While seated EOB, pt guided in visual assessment with largest impairment noted in large L beating nystagmus in L eye with eye movement to L side. Pt also experienced dizziness with performance of standing 360 deg turn to L  when reaching 180 deg. CGA to reach seated position. Symptoms dissipate within 30-40sec upon sitting.   Therapeutic Activity: Bed Mobility: Patient performed supine to/from sit with supervision requiring extra time and verbal cues for effort. Transfers: Patient performed sit <> stand  and stand pivot transfers with MinA initially requiring several attempts to stand from bed and improves to CGA/ supervision by send of session. Provided vc/tc for technique with forward lean and positioning of feet.  Car transfer performed with CGA and no assist required for pt to bring BLE into/ out of footwell of car once seated.   Gait Training:  Patient ambulated 180 feet x2 using RW with CGA. Ambulated with decreased step height/ length as well as consistent but slow pace. Provided vc/tc for L DF paired with L knee flexion in swing phase with improved foot clearance. Pt able to heel strike. Attempt for backward amb with decreased step length and balance requiring MinA to maintain. Forward amb x10 feet with no UE support with  significant decrease in quality of gait with flat foot strike with L foot and further decrease in step height/ length bilaterally.   Guided in curb step training using RW, and provided with vc for instructions throughout and pt able to follow instructions and lead to ascend with RLE, then lead to descend with LLE all with  RW and CGA.   Neuromuscular Re-ed: NMR facilitated during session with focus on standing balance. Pt guided in Berg Balance testing and FGA.  Patient demonstrates increased fall risk as noted by score of  31/56 on Berg Balance Scale.  (<36= high risk for falls, close to 100%; 37-45 significant >80%; 46-51 moderate >50%; 52-55 lower >25%).   Patient demonstrates increased fall risk as noted by score of 12/30 on  Functional Gait Assessment.   (<22/30 = predictive of falls, <20/30 = fall in 6 months, <18/30 = predictive of falls in PD) MCID: 5 points stroke population, 4 points geriatric population (ANPTA Core Set of Outcome Measures for Adults with Neurologic Conditions, 2018)   Patient supine  in bed at end of session with brakes locked, bed alarm set, and all needs within reach.   Discharge Criteria: Patient will be discharged from PT if patient refuses treatment 3 consecutive times without medical reason, if treatment goals not met, if there is a change in medical status, if patient makes no progress towards goals or if patient is discharged from hospital.  The above assessment, treatment plan, treatment alternatives and goals were discussed and mutually agreed upon: by patient  Alger Simons PT, DP, CSRS 12/24/2021, 4:59 PM

## 2021-12-25 DIAGNOSIS — N184 Chronic kidney disease, stage 4 (severe): Secondary | ICD-10-CM | POA: Diagnosis not present

## 2021-12-25 DIAGNOSIS — K5900 Constipation, unspecified: Secondary | ICD-10-CM

## 2021-12-25 DIAGNOSIS — D638 Anemia in other chronic diseases classified elsewhere: Secondary | ICD-10-CM

## 2021-12-25 DIAGNOSIS — I639 Cerebral infarction, unspecified: Secondary | ICD-10-CM | POA: Diagnosis not present

## 2021-12-25 DIAGNOSIS — E876 Hypokalemia: Secondary | ICD-10-CM | POA: Diagnosis not present

## 2021-12-25 MED ORDER — SORBITOL 70 % SOLN
60.0000 mL | Freq: Once | Status: AC
Start: 2021-12-25 — End: 2021-12-25
  Administered 2021-12-25: 60 mL via ORAL
  Filled 2021-12-25: qty 60

## 2021-12-25 MED ORDER — POLYETHYLENE GLYCOL 3350 17 G PO PACK: 17.0000 g | PACK | Freq: Every day | ORAL | Status: DC | PRN

## 2021-12-25 NOTE — Plan of Care (Signed)
  Problem: Consults Goal: RH STROKE PATIENT EDUCATION Description: See Patient Education module for education specifics  Outcome: Progressing   Problem: RH SAFETY Goal: RH STG ADHERE TO SAFETY PRECAUTIONS W/ASSISTANCE/DEVICE Description: STG Adhere to Safety Precautions With cues Assistance/Device. Outcome: Progressing   Problem: RH KNOWLEDGE DEFICIT Goal: RH STG INCREASE KNOWLEDGE OF DIABETES Description: Patient and family will be able to manage DM with dietary modifications using handouts and educ material independently Outcome: Progressing Goal: RH STG INCREASE KNOWLEDGE OF HYPERTENSION Description: Patient and family will be able to manage HTN  with meds and dietary modifications using handouts and educ material independently Outcome: Progressing   Problem: RH KNOWLEDGE DEFICIT Goal: RH STG INCREASE KNOWLEDGE OF HYPERTENSION Description: Patient and family will be able to manage HTN  with meds and dietary modifications using handouts and educ material independently Outcome: Progressing

## 2021-12-25 NOTE — Progress Notes (Signed)
PROGRESS NOTE   Subjective/Complaints: Reports nausea has been resolved for past 2 days. Reports she feels a little constipated.   Review of Systems  No fever, chills, HA, Abd pain. Nausea has resolved. + constipation.  Objective:   No results found. Recent Labs    12/23/21 0314 12/24/21 0514  WBC 5.1 5.3  HGB 8.5* 8.9*  HCT 25.6* 27.2*  PLT 182 197   Recent Labs    12/23/21 0314 12/24/21 0514  NA 141 141  K 3.3* 3.9  CL 107 109  CO2 27 26  GLUCOSE 101* 97  BUN 44* 47*  CREATININE 3.72* 3.33*  CALCIUM 9.1 9.4    Intake/Output Summary (Last 24 hours) at 12/25/2021 1206 Last data filed at 12/24/2021 1854 Gross per 24 hour  Intake 720 ml  Output --  Net 720 ml        Physical Exam: Vital Signs Blood pressure (!) 148/77, pulse 66, temperature 98 F (36.7 C), temperature source Oral, resp. rate 18, height 5\' 2"  (1.575 m), weight 61.5 kg, last menstrual period 05/22/2018, SpO2 100 %.  Physical Exam: Height 5\' 2"  (1.575 m), weight 61.5 kg, last menstrual period 05/22/2018. Gen: no distress, normal appearing, sitting in bed HEENT: oral mucosa pink and moist, NCAT Cardio: Reg rate Chest: normal effort, normal rate of breathing Abd: soft, non-distended, NT, +BS Ext: no edema Psych: pleasant, normal affect Skin: intact Neuro/MSK: Alert and oriented x3, no aphasia, +nystagmus, right INO, strength and sensation intact.    Assessment/Plan: 1. Functional deficits which require 3+ hours per day of interdisciplinary therapy in a comprehensive inpatient rehab setting. Physiatrist is providing close team supervision and 24 hour management of active medical problems listed below. Physiatrist and rehab team continue to assess barriers to discharge/monitor patient progress toward functional and medical goals  Care Tool:  Bathing    Body parts bathed by patient: Right arm, Left arm, Chest, Abdomen, Front perineal  area, Buttocks, Right upper leg, Face, Left lower leg, Right lower leg, Left upper leg         Bathing assist Assist Level: Minimal Assistance - Patient > 75%     Upper Body Dressing/Undressing Upper body dressing   What is the patient wearing?: Pull over shirt    Upper body assist Assist Level: Moderate Assistance - Patient 50 - 74%    Lower Body Dressing/Undressing Lower body dressing      What is the patient wearing?: Underwear/pull up     Lower body assist Assist for lower body dressing: Moderate Assistance - Patient 50 - 74%     Toileting Toileting    Toileting assist Assist for toileting: Moderate Assistance - Patient 50 - 74%     Transfers Chair/bed transfer  Transfers assist     Chair/bed transfer assist level: Contact Guard/Touching assist     Locomotion Ambulation   Ambulation assist      Assist level: Contact Guard/Touching assist Assistive device: Walker-rolling Max distance: 180 ft   Walk 10 feet activity   Assist     Assist level: Supervision/Verbal cueing Assistive device: Walker-rolling   Walk 50 feet activity   Assist    Assist level: Contact Guard/Touching  assist Assistive device: Walker-rolling    Walk 150 feet activity   Assist    Assist level: Contact Guard/Touching assist Assistive device: Walker-rolling    Walk 10 feet on uneven surface  activity   Assist     Assist level: Contact Guard/Touching assist Assistive device: Walker-rolling   Wheelchair     Assist Is the patient using a wheelchair?: No Type of Wheelchair: Manual Wheelchair activity did not occur: N/A         Wheelchair 50 feet with 2 turns activity    Assist    Wheelchair 50 feet with 2 turns activity did not occur: N/A       Wheelchair 150 feet activity     Assist  Wheelchair 150 feet activity did not occur: N/A       Blood pressure (!) 148/77, pulse 66, temperature 98 F (36.7 C), temperature source Oral,  resp. rate 18, height 5\' 2"  (1.575 m), weight 61.5 kg, last menstrual period 05/22/2018, SpO2 100 %.  Medical Problem List and Plan: 1. 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  Antithrombotics: -DVT/anticoagulation:  TED hose             -antiplatelet therapy: aspirin 81 mg daily, Brilinta 60 mg daily 3. Pain Management: Tylenol as needed 4. Depression:             Continue Cymbalta DR 60 mg q HS             -antipsychotic agents: n/a             -continue trazodone prn sleep 5. Neuropsych/cognition: This patient is capable of making decisions on her own behalf. 6. Skin/Wound Care: Routine skin care checks 7. Fluids/Electrolytes/Nutrition: Routine Is and Os and follow-up chemistries 8: Hypertension: monitor and consider home hydralazine 50 mg q AM, HS -continue amlodipine 5 mg daily             -continue carvedilol 12.5 mg BID             -continue Lasix 40 mg daily 9: Hyperlipidemia: continue Lipitor 10: Seasonal allergies: continue fulticisone and  11: CKD stage IV: baseline Scr appears to be about 2.6-3.0; currently 3.72             -follow-up BMP and trend  -Improved to 3.33 7/15 12: DM-2: continue SSSI and CBGs (Hgb A1c = 6.3) 13: Seizure disorder: no new seizure activity; off Keppra. Will follow-up with [facility] 14: GERD: continue Protonix 15: Migraine headache history 16: Anemia, chronic disease: follow-up CBC (trending downward over last 24 months)  -Stable at HGB 8.9 17: Hypokalemia:  (Lasix stopped 7/14)  follow-up BMP  -K+ 2.9 7/15,stable 18. Constipation  -Miralax PRN  -give sorbitol 60mg  7/16    LOS: 2 days A FACE TO FACE EVALUATION WAS PERFORMED  Jennye Boroughs 12/25/2021, 12:06 PM

## 2021-12-25 NOTE — Progress Notes (Signed)
Occupational Therapy Session Note  Patient Details  Name: Christina Orozco MRN: [id number] Date of Birth: [birth year]  Today's Date: 12/25/2021 OT Individual Time: 6073-7106 OT Individual Time Calculation (min): 42 min    Short Term Goals: Week 1:  OT Short Term Goal 1 (Week 1): Pt will complete grooming at the sink with close supervision LRAD PRN. OT Short Term Goal 2 (Week 1): Pt will complete UB and LB dressing with close supervision including item retrieval LRAD PRN. OT Short Term Goal 3 (Week 1): Pt will complete toileting and toilet transfer with close supervision for safety LRAD PRN. OT Short Term Goal 4 (Week 1): Pt will complete light meal prep task with close supervision for safety with LRAD PRN.  Skilled Therapeutic Interventions/Progress Updates: Patient reports continued improvements with vision and left side coordination. Patient displayed good safety with functional transfers during ADL tasks and expressed understanding of fall risks and stated being aware that she is not to transfer with out assist even though she required little physical assist during this treatment session.      Therapy Documentation Precautions:  Precautions Precautions: Fall Precaution Comments: L hemipareisis, constant headache and neck pain, rotator cuff injury Restrictions Weight Bearing Restrictions: No General:   Vital Signs: Therapy Vitals Temp: 98 F (36.7 C) Pulse Rate: 77 Resp: 17 BP: (!) 147/87 Patient Position (if appropriate): Lying Oxygen Therapy SpO2: 100 % O2 Device: Room Air Pain:No reports of pain.  YIR:SWNIOEV seen for am ADL including shower. ADL Grooming: SBA/CGA standing at the sink Where Assessed-Grooming: Standing at sink Upper Body Bathing: Supervision/safety, Independent (Pt able to bathe all UB parts with close supervision for safety while seated on the shower chair.) Where Assessed-Upper Body Bathing: Shower while seated Lower Body Bathing: Close SBA (Pt  able to bathe all LB parts with minimal assistance -  Where Assessed-Lower Body Bathing: Shower Upper Body Dressing: Supervision/safety (Pt able to doff and don a pull-over style shirt while seated with supervision for safety.) Where Assessed-Upper Body Dressing:  (sitting on shower chair to doff & toilet to don) Lower Body Dressing: SBA/CGA to doff and don underware and pull on pants. Toileting: Minimal assistance (Pt able to complete 3/3 toileting tasks with minimal assist - CGA for safety when standing.) Where Assessed-Toileting: Glass blower/designer: SBA/CGA with RW from bed to toilet Toilet Transfer Method: Counselling psychologist: Emergency planning/management officer Transfer: SBA/CGA RW to shower with Financial risk analyst Method: Heritage manager: Grab bars  Vision No reports of blurred or double vision. Patient reports a significant improvement from one week prior   Perception Continue to have deficits on the left side especially with the LUE/hand      Balance Utilized walker for all standing tasks due to balance deficits     Therapy/Group: Individual Therapy  Christina Orozco 12/25/2021, 4:14 PM

## 2021-12-26 DIAGNOSIS — I639 Cerebral infarction, unspecified: Secondary | ICD-10-CM | POA: Diagnosis not present

## 2021-12-26 LAB — BASIC METABOLIC PANEL
Anion gap: 8 (ref 5–15)
BUN: 36 mg/dL — ABNORMAL HIGH (ref 6–20)
CO2: 23 mmol/L (ref 22–32)
Calcium: 9.4 mg/dL (ref 8.9–10.3)
Chloride: 110 mmol/L (ref 98–111)
Creatinine, Ser: 3.11 mg/dL — ABNORMAL HIGH (ref 0.44–1.00)
GFR, Estimated: 18 mL/min — ABNORMAL LOW (ref >60)
Glucose, Bld: 95 mg/dL (ref 70–99)
Potassium: 3.9 mmol/L (ref 3.5–5.1)
Sodium: 141 mmol/L (ref 135–145)

## 2021-12-26 LAB — CBC
HCT: 28.8 % — ABNORMAL LOW (ref 36.0–46.0)
Hemoglobin: 9.4 g/dL — ABNORMAL LOW (ref 12.0–15.0)
MCH: 29.2 pg (ref 26.0–34.0)
MCHC: 32.6 g/dL (ref 30.0–36.0)
MCV: 89.4 fL (ref 80.0–100.0)
Platelets: 210 10*3/uL (ref 150–400)
RBC: 3.22 MIL/uL — ABNORMAL LOW (ref 3.87–5.11)
RDW: 13.3 % (ref 11.5–15.5)
WBC: 6 10*3/uL (ref 4.0–10.5)
nRBC: 0 % (ref 0.0–0.2)

## 2021-12-26 MED ORDER — MUSCLE RUB 10-15 % EX CREA
1.0000 | TOPICAL_CREAM | Freq: Two times a day (BID) | CUTANEOUS | Status: DC | PRN
  Filled 2021-12-26: qty 85

## 2021-12-26 MED ORDER — SENNOSIDES-DOCUSATE SODIUM 8.6-50 MG PO TABS
2.0000 | ORAL_TABLET | Freq: Two times a day (BID) | ORAL | Status: DC
  Administered 2021-12-26 – 2021-12-30 (×7): 2 via ORAL
  Filled 2021-12-26 (×9): qty 2

## 2021-12-26 NOTE — Progress Notes (Signed)
Occupational Therapy Session Note  Patient Details  Name: Christina Orozco MRN: [id number] Date of Birth: 11/23/1970  Today's Date: 12/26/2021 OT Individual Time: 0623-7628 session 1 OT Individual Time Calculation (min): 72 min  Session 2: 3151-7616   Short Term Goals: Week 1:  OT Short Term Goal 1 (Week 1): Pt will complete grooming at the sink with close supervision LRAD PRN. OT Short Term Goal 2 (Week 1): Pt will complete UB and LB dressing with close supervision including item retrieval LRAD PRN. OT Short Term Goal 3 (Week 1): Pt will complete toileting and toilet transfer with close supervision for safety LRAD PRN. OT Short Term Goal 4 (Week 1): Pt will complete light meal prep task with close supervision for safety with LRAD PRN.  Skilled Therapeutic Interventions/Progress Updates:  Session 1: Pt greeted supine in bed  agreeable to OT intervention. Session focus on BADL reeducation, functional mobility, dynamic standing balance and decreasing overall caregiver burden.  Of note pt reports feeling like her visual deficits have mostly  resolved but still reports nystagmus when scanning to L side. Pt completed supine>sit with supervision with HOB semi elevated. Pt donned socks MOD I from EOB, pt completed ambulatory toilet transfer with Rw and supervision. Pt completed 3/3 toileting tasks with supervision with + urine void. Pt completed ambulatory shower transfer to walk in shower with supervision using grab bar. Pt completed bathing from shower seat with supervision. Pt reports tub shower at home, she already has TTB.  Pt exited shower with supervision with Rw, pt does report dizziness from increased movement post shower but resolves with seated rest breaks. pt completed dressing from w/c with overall supervision for UB/LB dressing.  Pt exited bathroom with Rw and CGA, pt completed grooming tasks at sink with supervision.pt reports fatigue after bathing and dressing therefore transported pt  to gym in w/c for energy conservation.  In the gym worked on various therapeutic activities focused on LUE [facility] and strength. Pt completed below theract: -9HPT: RUE: 38 secs LUE:  1 min       -Peg board with LUE with pt instructed to replicate peg structure from visual aid provided with an emphasis on functional grasp and in hand manipulation skills.     -tack peg board with an emphasis on pincer grasp and improved sensation/FMC in LUE.  Pt transported back to room in w/c with total A where pt completed stand pivot back to EOB with rw and supervision.  pt left supine in bed with bed alarm activated and all needs within reach.                       Session 2: Pt greeted seated in recliner , pt agreeable to OT intervention. Pt completed ambulatory toilet transfer to bathroom with Rw and CGA. Pt completed 3/3 toileting tasks with supervision and able to exit bathroom with RW and supervision.  Pt stood at sink to wash hands with supervision. Pt ambulated to gym with rw and supervision. Remainder of session focused on RUE coordination, dynamic standing balance and functional mobility. Pt able to stand on compliant cushion to reach out of BOS to BITS to sequence through letters A-Z with results below: -65% accuracy, 3.65 sec reaction time, 14 misses, needed intermittent unilateal support on RW, mild swaying noted but no overt LOB. No dizziness reported and no visual deficits noted.  -Pt also worked on trail making task using BITS standing on compliant cushion with pt needing unilateral support with  LUE on RW, 9min and 44 secs and no errors to complete task. Laslty pt completed functional mobility course where pt instructed to ambulate around obstacles with Rw and step over obstacles with BUE support on RW, pt also able to ambulate up ramp and across uneven surfaces with Rw and supervision.  Pt ambulated back to room with rw and supervision. Pt left up in recliner with all needs within reach.    Therapy  Documentation Precautions:  Precautions Precautions: Fall Precaution Comments: L hemipareisis, constant headache and neck pain, rotator cuff injury Restrictions Weight Bearing Restrictions: No  Pain: Session 1: unrated pain in back of head, rest breaks provided as needed Session 2: no pain reported during session    Therapy/Group: Individual Therapy  Corinne Ports [facility] 12/26/2021, 11:54 AM

## 2021-12-26 NOTE — Progress Notes (Signed)
Patient ID: Andre Gallego, female   DOB: 03/20/1971, 51 y.o.   MRN: [id number] Met with the patient to review current situation, rehab process, team conference and plan of care. Discussed previous stroke hx; reports each one has been different but this is the worst so far. Aura of cigarette smoke prior to last stroke and this stroke. Reviewed secondary risks including previous strokes, DM, HTN and medications. Patient is up to date on medications and dietary modification recommendations; been a DM for 25 years but on no meds; A1C 6.3 and cholesterol levels WNL; on statin since first stroke.  Continue to follow along to discharge to address educational needs to facilitate preparation for discharge. Requested note for court; forwarded request to SW. Margarito Liner

## 2021-12-26 NOTE — Progress Notes (Signed)
Occupational Therapy Session Note  Patient Details  Name: Christina Orozco MRN: [id number] Date of Birth: 1970-10-24  Today's Date: 12/26/2021 OT Individual Time: 1000-1030 OT Individual Time Calculation (min): 30 min    Short Term Goals: Week 1:  OT Short Term Goal 1 (Week 1): Pt will complete grooming at the sink with close supervision LRAD PRN. OT Short Term Goal 2 (Week 1): Pt will complete UB and LB dressing with close supervision including item retrieval LRAD PRN. OT Short Term Goal 3 (Week 1): Pt will complete toileting and toilet transfer with close supervision for safety LRAD PRN. OT Short Term Goal 4 (Week 1): Pt will complete light meal prep task with close supervision for safety with LRAD PRN.  Skilled Therapeutic Interventions/Progress Updates:    Pt resting in w/c upon arrival. OT intervention with focus on LUE FMC seated EOB. Pt reports her vision has improved in addition to the strength in her LUE. Pain as noted below. Pt opened medicine bottles and removed small beads. Pt replaced small beads and placed lids on bottles. Pt stated she can write with BUE. Pt practiced printing her name with LUE. Pt with initial difficulty manipulating pen in Lt hand but was able to adjust and write her name. Pt issued a deck of playing cards to work with Lt hand manipulation tasks. Pt remained seated in bed with bed alarm activated. All needs within reach.   Therapy Documentation Precautions:  Precautions Precautions: Fall Precaution Comments: L hemipareisis, constant headache and neck pain, rotator cuff injury Restrictions Weight Bearing Restrictions: No   Pain: Pt reports pain and hypersensitivity on Lt hand, proximal to 4th digit; nursing aware   Therapy/Group: Individual Therapy  Leroy Libman 12/26/2021, 10:59 AM

## 2021-12-26 NOTE — Progress Notes (Signed)
Inpatient Rehabilitation  Patient information reviewed and entered into eRehab system by Sheily Lineman Ivery Nanney, OTR/L.   Information including medical coding, functional ability and quality indicators will be reviewed and updated through discharge.    

## 2021-12-26 NOTE — Progress Notes (Signed)
PROGRESS NOTE   Subjective/Complaints: Normal bowel pattern once a week Left sided neck pain    Review of Systems  No fever, chills, HA, Abd pain. Nausea has resolved. + constipation.  Objective:   No results found. Recent Labs    12/24/21 0514 12/26/21 0547  WBC 5.3 6.0  HGB 8.9* 9.4*  HCT 27.2* 28.8*  PLT 197 210    Recent Labs    12/24/21 0514 12/26/21 0547  NA 141 141  K 3.9 3.9  CL 109 110  CO2 26 23  GLUCOSE 97 95  BUN 47* 36*  CREATININE 3.33* 3.11*  CALCIUM 9.4 9.4     Intake/Output Summary (Last 24 hours) at 12/26/2021 0749 Last data filed at 12/25/2021 2030 Gross per 24 hour  Intake 840 ml  Output --  Net 840 ml         Physical Exam: Vital Signs Blood pressure 140/82, pulse 70, temperature 97.7 F (36.5 C), temperature source Oral, resp. rate 18, height 5\' 2"  (1.575 m), weight 61.5 kg, last menstrual period 05/22/2018, SpO2 100 %.  Physical Exam: Height 5\' 2"  (1.575 m), weight 61.5 kg, last menstrual period 05/22/2018. Gen: no distress, normal appearing, sitting in bed  General: No acute distress Mood and affect are appropriate Heart: Regular rate and rhythm no rubs murmurs or extra sounds Lungs: Clear to auscultation, breathing unlabored, no rales or wheezes Abdomen: Positive bowel sounds, soft nontender to palpation, nondistended Extremities: No clubbing, cyanosis, or edema Skin: No evidence of breakdown, no evidence of rash Neurologic: Cranial nerves II through XII intact, motor strength is 5/5 in right and 4- left deltoid, bicep, tricep, grip, hip flexor, knee extensors, ankle dorsiflexor and plantar flexor Sensory exam normal sensation to light touch bilateral upper and lower extremities  Musculoskeletal: tenderness to palp cervical paraspinals Left >R     Assessment/Plan: 1. Functional deficits which require 3+ hours per day of interdisciplinary therapy in a comprehensive  inpatient rehab setting. Physiatrist is providing close team supervision and 24 hour management of active medical problems listed below. Physiatrist and rehab team continue to assess barriers to discharge/monitor patient progress toward functional and medical goals  Care Tool:  Bathing    Body parts bathed by patient: Right arm, Right lower leg, Left lower leg, Face, Left arm, Chest, Abdomen, Front perineal area, Buttocks, Right upper leg, Left upper leg         Bathing assist Assist Level: Supervision/Verbal cueing     Upper Body Dressing/Undressing Upper body dressing   What is the patient wearing?: Pull over shirt    Upper body assist Assist Level: Supervision/Verbal cueing    Lower Body Dressing/Undressing Lower body dressing      What is the patient wearing?: Underwear/pull up, Pants     Lower body assist Assist for lower body dressing: Contact Guard/Touching assist     Toileting Toileting    Toileting assist Assist for toileting: Contact Guard/Touching assist     Transfers Chair/bed transfer  Transfers assist     Chair/bed transfer assist level: Contact Guard/Touching assist     Locomotion Ambulation   Ambulation assist      Assist level: Contact Guard/Touching assist  Assistive device: Walker-rolling Max distance: 180 ft   Walk 10 feet activity   Assist     Assist level: Supervision/Verbal cueing Assistive device: Walker-rolling   Walk 50 feet activity   Assist    Assist level: Contact Guard/Touching assist Assistive device: Walker-rolling    Walk 150 feet activity   Assist    Assist level: Contact Guard/Touching assist Assistive device: Walker-rolling    Walk 10 feet on uneven surface  activity   Assist     Assist level: Contact Guard/Touching assist Assistive device: Walker-rolling   Wheelchair     Assist Is the patient using a wheelchair?: No Type of Wheelchair: Manual Wheelchair activity did not occur:  N/A         Wheelchair 50 feet with 2 turns activity    Assist    Wheelchair 50 feet with 2 turns activity did not occur: N/A       Wheelchair 150 feet activity     Assist  Wheelchair 150 feet activity did not occur: N/A       Blood pressure 140/82, pulse 70, temperature 97.7 F (36.5 C), temperature source Oral, resp. rate 18, height 5\' 2"  (1.575 m), weight 61.5 kg, last menstrual period 05/22/2018, SpO2 100 %.  Medical Problem List and Plan: 1. 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  Antithrombotics: -DVT/anticoagulation:  TED hose             -antiplatelet therapy: aspirin 81 mg daily, Brilinta 60 mg daily 3. Pain Management: Tylenol as needed, sportscreme and heat for neck pain  4. Depression:             Continue Cymbalta DR 60 mg q HS             -antipsychotic agents: n/a             -continue trazodone prn sleep 5. Neuropsych/cognition: This patient is capable of making decisions on her own behalf. 6. Skin/Wound Care: Routine skin care checks 7. Fluids/Electrolytes/Nutrition: Routine Is and Os and follow-up chemistries 8: Hypertension: monitor and consider home hydralazine 50 mg q AM, HS -continue amlodipine 5 mg daily             -continue carvedilol 12.5 mg BID             -continue Lasix 40 mg daily Vitals:   12/25/21 1947 12/26/21 0352  BP: (!) 150/83 140/82  Pulse: 74 70  Resp: 18 18  Temp: 98.9 F (37.2 C) 97.7 F (36.5 C)  SpO2: 100% 100%    9: Hyperlipidemia: continue Lipitor 10: Seasonal allergies: continue fulticisone and  11: CKD stage IV: baseline Scr appears to be about 2.6-3.0; currently 3.72             -follow-up BMP and trend     Latest Ref Rng & Units 12/26/2021    5:47 AM 12/24/2021    5:14 AM 12/23/2021    3:14 AM  BMP  Glucose 70 - 99 mg/dL 95  97   101   BUN 6 - 20 mg/dL 36  47  44   Creatinine 0.44 - 1.00 mg/dL 3.11  3.33  3.72  Sodium 135 - 145 mmol/L 141  141  141   Potassium 3.5 - 5.1 mmol/L 3.9  3.9  3.3   Chloride 98 - 111 mmol/L 110  109  107   CO2 22 - 32 mmol/L 23  26  27    Calcium 8.9 - 10.3 mg/dL 9.4  9.4  9.1     12: DM-2: continue SSSI and CBGs (Hgb A1c = 6.3) 13: Seizure disorder: no new seizure activity; off Keppra. Will follow-up with [facility] 14: GERD: continue Protonix 15: Migraine headache history 16: Anemia, chronic disease: follow-up CBC (trending downward over last 24 months)  -Stable at HGB 8.9 17: Hypokalemia:  (Lasix stopped 7/14)  follow-up BMP    Resolved  18. Constipation  -Miralax PRN  -give sorbitol 60mg  7/16    LOS: 3 days A FACE TO FACE EVALUATION WAS PERFORMED  Charlett Blake 12/26/2021, 7:49 AM

## 2021-12-26 NOTE — Progress Notes (Signed)
Inpatient Rehabilitation Care Coordinator Assessment and Plan Patient Details  Name: Christina Orozco MRN: [id number] Date of Birth: [birth year]  Today's Date: 12/26/2021  Hospital Problems: Principal Problem:   Acute embolic stroke [facility]) Active Problems:   Anemia, chronic disease   Chronic kidney disease (CKD), stage IV (severe) ([place])  Past Medical History:  Past Medical History:  Diagnosis Date   Anemia    severe   Anxiety    Asthma    CKD (chronic kidney disease)    stage IV   DDD (degenerative disc disease), cervical    Depression    Diabetes mellitus ([place])    Dislocated shoulder    right   GERD (gastroesophageal reflux disease)    Headache    migraines (about once a month)   History of kidney stones    Hypertension    Neuropathy    Peripheral vascular disease ([place])    Pneumonia    Stroke [facility])    Past Surgical History:  Past Surgical History:  Procedure Laterality Date   ADENOIDECTOMY     APPENDECTOMY     CERVICAL SPINE SURGERY  06/2012   C5-C6   EYE SURGERY     left eye surgery    FOOT SURGERY     KENALOG INJECTION Left 03/10/2021   Procedure: Intravitreal Avastin Injection;  Surgeon: Sherlynn Stalls, MD;  Location: [place];  Service: Ophthalmology;  Laterality: Left;   LOOP RECORDER INSERTION N/A 08/27/2017   Procedure: LOOP RECORDER INSERTION;  Surgeon: Sanda Klein, MD;  Location: [place] CV LAB;  Service: Cardiovascular;  Laterality: N/A;   MEMBRANE PEEL Left 03/10/2021   Procedure: MEMBRANE PEEL;  Surgeon: Sherlynn Stalls, MD;  Location: [place];  Service: Ophthalmology;  Laterality: Left;   PARS PLANA VITRECTOMY Left 03/10/2021   Procedure: TWENTY-FIVE GAUGE PARS PLANA VITRECTOMY;  Surgeon: Sherlynn Stalls, MD;  Location: [place];  Service: Ophthalmology;  Laterality: Left;   PHOTOCOAGULATION WITH LASER Left 03/10/2021   Procedure: PHOTOCOAGULATION WITH LASER;  Surgeon: Sherlynn Stalls, MD;  Location: [place];  Service: Ophthalmology;  Laterality: Left;    SHOULDER SURGERY     TEE WITHOUT CARDIOVERSION N/A 08/27/2017   Procedure: TRANSESOPHAGEAL ECHOCARDIOGRAM (TEE);  Surgeon: Sanda Klein, MD;  Location: [facility] ENDOSCOPY;  Service: Cardiovascular;  Laterality: N/A;   TONSILLECTOMY     Social History:  reports that she has never smoked. She has never used smokeless tobacco. She reports that she does not drink alcohol and does not use drugs.  Family / Support Systems Other Supports: Jeneen Rinks (Brother), Anitraous (Daughter) Anticipated Caregiver: Anitrarous Ability/Limitations of Caregiver: Min A ( Home for summer, returning to school in August) Caregiver Availability: 24/7 Family Dynamics: Support from children  Social History Preferred language: English Religion: [employer] - How often do you need to have someone help you when you read instructions, pamphlets, or other written material from your doctor or pharmacy?: Never Writes: Yes   Abuse/Neglect Abuse/Neglect Assessment Can Be Completed: Yes Physical Abuse: Denies Verbal Abuse: Denies Sexual Abuse: Denies Exploitation of patient/patient's resources: Denies Self-Neglect: Denies  Patient response to: Social Isolation - How often do you feel lonely or isolated from those around you?: Never  Emotional Status Recent Psychosocial Issues: coping Psychiatric History: hx of anxiety and depression  Patient / Family Perceptions, Expectations & Goals Pt/Family understanding of illness & functional limitations: yes Premorbid pt/family roles/activities: Previously independent and active in the communitty Anticipated changes in roles/activities/participation: daughter assisting patient ar home, works on weekends Pt/family expectations/goals: Supervision  Community [employer] Agencies: None Premorbid Home Care/DME Agencies: Other (Comment) ([employer], SPC, Civil engineer, contracting, Education officer, environmental) Transportation available at discharge: family able to transport Is the patient able  to respond to transportation needs?: Yes In the past 12 months, has lack of transportation kept you from medical appointments or from getting medications?: No In the past 12 months, has lack of transportation kept you from meetings, work, or from getting things needed for daily living?: No Resource referrals recommended: Neuropsychology  Discharge Planning Living Arrangements: Spouse/significant other Support Systems: Children, Spouse/significant other Type of Residence: Private residence Insurance Resources: [employer] (specify county) Museum/gallery curator Resources: Family Support Financial Screen Referred: No Living Expenses: Education officer, community Management: Patient Does the patient have any problems obtaining your medications?: No Home Management: independent Patient/Family Preliminary Plans: daughter able to assist if needed Care Coordinator Barriers to Discharge: Insurance for SNF coverage, Lack of/limited family support, Decreased caregiver support Care Coordinator Anticipated Follow Up Needs: HH/OP Expected length of stay: 7-10 Days  Clinical Impression Sw met with patient, introduced self and explained role. No additional questions or concerns, sw will continue to follow up.  [name] 12/26/2021, 12:30 PM

## 2021-12-26 NOTE — Progress Notes (Signed)
Inpatient Rehabilitation Center Individual Statement of Services  Patient Name:  Christina Orozco  Date:  12/26/2021  Welcome to the [facility].  Our goal is to provide you with an individualized program based on your diagnosis and situation, designed to meet your specific needs.  With this comprehensive rehabilitation program, you will be expected to participate in at least 3 hours of rehabilitation therapies Monday-Friday, with modified therapy programming on the weekends.  Your rehabilitation program will include the following services:  Physical Therapy (PT), Occupational Therapy (OT), Speech Therapy (ST), 24 hour per day rehabilitation nursing, Therapeutic Recreaction (TR), Neuropsychology, Care Coordinator, Rehabilitation Medicine, Nutrition Services, Pharmacy Services, and Other  Weekly team conferences will be held on Wednesdays to discuss your progress.  Your Inpatient Rehabilitation Care Coordinator will talk with you frequently to get your input and to update you on team discussions.  Team conferences with you and your family in attendance may also be held.  Expected length of stay: 7-10 Days  Overall anticipated outcome: Supervision  Depending on your progress and recovery, your program may change. Your Inpatient Rehabilitation Care Coordinator will coordinate services and will keep you informed of any changes. Your Inpatient Rehabilitation Care Coordinator's name and contact numbers are listed  below.  The following services may also be recommended but are not provided by the [facility]:   [place] will be made to provide these services after discharge if needed.  Arrangements include referral to agencies that provide these services.  Your insurance has been verified to be:  [place] MEDICAID Your primary doctor is:  Jenel Lucks, PA-C  Pertinent  information will be shared with your doctor and your insurance company.  Inpatient Rehabilitation Care Coordinator:  Erlene Quan, [place] or [phone]  Information discussed with and copy given to patient by: [name], 12/26/2021, 11:09 AM

## 2021-12-26 NOTE — Progress Notes (Signed)
Physical Therapy Session Note  Patient Details  Name: Christina Orozco MRN: [id number] Date of Birth: [birth year]  Today's Date: 12/26/2021 PT Individual Time: 1030-1130 PT Individual Time Calculation (min): 60 min   Short Term Goals: Week 1:  PT Short Term Goal 1 (Week 1): STG = LTG d/t ELOS  Skilled Therapeutic Interventions/Progress Updates: Pt presents sitting EOB and agreeable to therapy.  Pt transfers sit to stand w/ close supervision and transfers to w/c.  Pt wheeled to main gym for energy conservation.  Pt transfers throughout session w/ close supervision and then steps onto Airex cushion w/ HHA.  Pt performs marching 3 x 10 w/ min A, occasional LOB requiring mod A to regain.  Pt performed partial squats on Airex cushion w/ min A for LOB.  Pt amb multiple trials during session.  Pt amb 195' x 3 w/ RW and close supervision to Kenneth.  As pt fatigues, decreased positioning of LLE noted w/ decreased BOS, pt unaware.  Pt amb 115' w/ SPC and CGA w/ increased fatigue noted w/ LLE.  Pt returned to room and amb x 15' w/ RW into room and remained sitting in recliner w/ all needs in reach.     Therapy Documentation Precautions:  Precautions Precautions: Fall Precaution Comments: L hemipareisis, constant headache and neck pain, rotator cuff injury Restrictions Weight Bearing Restrictions: No General:   Vital Signs:   Pain:0/10 Pain Assessment Pain Score: 5      Therapy/Group: Individual Therapy  [name] 12/26/2021, 12:24 PM

## 2021-12-26 NOTE — IPOC Note (Signed)
Overall Plan of Care [facility]) Patient Details Name: Christina Orozco MRN: [id number] DOB: 18-Dec-1970  Admitting Diagnosis: Acute embolic stroke [facility])  Hospital Problems: Principal Problem:   Acute embolic stroke [facility]) Active Problems:   Anemia, chronic disease   Chronic kidney disease (CKD), stage IV (severe) (HCC)     Functional Problem List: Nursing Endurance, Medication Management, Safety  PT Balance, Endurance, Motor, Pain, Perception, Safety, Sensory  OT Balance, Perception, Safety, Cognition, Endurance, Motor, Vision  SLP    TR         Basic ADL's: OT Grooming, Bathing, Dressing, Toileting     Advanced  ADL's: OT Simple Meal Preparation     Transfers: PT Bed Mobility, Bed to Chair, Car, Manufacturing systems engineer, Metallurgist: PT Ambulation, Stairs     Additional Impairments: OT Fuctional Use of Upper Extremity  SLP        TR      Anticipated Outcomes Item Anticipated Outcome  Self Feeding modified independence  Swallowing      Basic self-care  modified independence - supervision with bathing  Toileting  modified independence   Bathroom Transfers modified independence  Bowel/Bladder  n/a  Transfers  Mod I  Locomotion  Mod I/ supervision  Communication     Cognition     Pain  n/a  Safety/Judgment  manage safety w cues   Therapy Plan: PT Intensity: Minimum of 1-2 x/day ,45 to 90 minutes PT Frequency: 5 out of 7 days PT Duration Estimated Length of Stay: 8-10 days OT Intensity: Minimum of 1-2 x/day, 45 to 90 minutes OT Frequency: 5 out of 7 days OT Duration/Estimated Length of Stay: 7-10 days     Team Interventions: Nursing Interventions Patient/Family Education, Disease Management/Prevention, Medication Management, Discharge Planning  PT interventions Ambulation/gait training, Community reintegration, DME/adaptive equipment instruction, Neuromuscular re-education, Psychosocial support, Stair training, UE/LE Strength  taining/ROM, Training and development officer, Discharge planning, Functional electrical stimulation, Pain management, Therapeutic Activities, UE/LE Coordination activities, Visual/perceptual remediation/compensation, Therapeutic Exercise, Splinting/orthotics, Patient/family education, Functional mobility training, Cognitive remediation/compensation  OT Interventions Balance/vestibular training, Discharge planning, Self Care/advanced ADL retraining, Therapeutic Activities, UE/LE Coordination activities, Cognitive remediation/compensation, Functional mobility training, Patient/family education, Therapeutic Exercise, Visual/perceptual remediation/compensation, DME/adaptive equipment instruction, Psychosocial support, Neuromuscular re-education, UE/LE Strength taining/ROM  SLP Interventions    TR Interventions    SW/CM Interventions Discharge Planning, Psychosocial Support, Patient/Family Education, Disease Management/Prevention   Barriers to Discharge MD  Medical stability  Nursing   1 level ; level entry w children; HHA 2 hours /day [place]  PT Decreased caregiver support, Lack of/limited family support, Insurance for SNF coverage    OT  (None, pending family availability to provide 24-hour assistance.)    SLP      SW Insurance for SNF coverage, Lack of/limited family support, Decreased caregiver support     Team Discharge Planning: Destination: PT-Home ,OT- Home , SLP-  Projected Follow-up: PT-Home health PT, Outpatient PT, OT-  Outpatient OT (pending visual impairments and progression), SLP-  Projected Equipment Needs: PT-To be determined, OT- To be determined, SLP-  Equipment Details: PT- , OT-  Patient/family involved in discharge planning: PT- Patient,  OT-Patient, SLP-   MD ELOS: 7-10d  Medical Rehab Prognosis:  Good Assessment: The patient has been admitted for CIR therapies with the diagnosis of CVA. The team will be addressing functional mobility, strength, stamina,  balance, safety, adaptive techniques and equipment, self-care, bowel and bladder mgt, patient and caregiver education, management of CKD. Goals have been set  at Mod I. Anticipated discharge destination is Home .        See Team Conference Notes for weekly updates to the plan of care

## 2021-12-26 NOTE — Discharge Summary (Signed)
Physician Discharge Summary  Patient ID: Christina Orozco MRN: [id number] DOB/AGE: 49-Nov-1972 49 y.o.  Admit date: 12/23/2021 Discharge date: 12/29/21  Discharge Diagnoses:  Principal Problem:   Acute embolic stroke [facility]) Active Problems:   Gastroesophageal reflux disease without esophagitis   Anemia, chronic disease   History of seizure   Hypertension   Generalized anxiety disorder   Chronic kidney disease (CKD), stage IV (severe) (HCC)   Left-sided weakness and visual deficits   Discharged Condition: stable  Significant Diagnostic Studies: N/A  Labs:  Basic Metabolic Panel:    Latest Ref Rng & Units 12/26/2021    5:47 AM 12/24/2021    5:14 AM 12/23/2021    3:14 AM  BMP  Glucose 70 - 99 mg/dL 95  97  101   BUN 6 - 20 mg/dL 36  47  44   Creatinine 0.44 - 1.00 mg/dL 3.11  3.33  3.72   Sodium 135 - 145 mmol/L 141  141  141   Potassium 3.5 - 5.1 mmol/L 3.9  3.9  3.3   Chloride 98 - 111 mmol/L 110  109  107   CO2 22 - 32 mmol/L 23  26  27    Calcium 8.9 - 10.3 mg/dL 9.4  9.4  9.1      CBC:    Latest Ref Rng & Units 12/26/2021    5:47 AM 12/24/2021    5:14 AM 12/23/2021    3:14 AM  CBC  WBC 4.0 - 10.5 K/uL 6.0  5.3  5.1   Hemoglobin 12.0 - 15.0 g/dL 9.4  8.9  8.5   Hematocrit 36.0 - 46.0 % 28.8  27.2  25.6   Platelets 150 - 400 K/uL 210  197  182      CBG: Recent Labs  Lab 12/27/21 0947 12/27/21 1623  GLUCAP 143* 146*    Brief HPI:   Christina Orozco is a 51 y.o. female with history of multiple prior strokes, CKD, anxiety/depression, T2DM who presented to the emergency department with left arm and leg weakness, decreased visual acuity dizziness and headache. On 12/18/21  Imaging was consistent with right frontal centrum semiovale and small left pontine infarcts.  MRI/MRA brain ordered for work-up however patient left without being seen.  She presented to [place] on 07/10 with left-sided weakness and visual deficits and MRI/MRA brain done revealing 1.9 cm  acute ischemic nonhemorrhagic infarct in the deep white matter right frontal centrum ovale and advanced chronic microvascular disease.    Dr. Erlinda Orozco recommended aspirin and Brilinta- P2y12 level 65 and stroke felt to be likely due to small vessel disease.  He question vasculitis as well as consideration of outpatient LP with holding of Brilinta however felt that this would likely have low yield.  Renal ultrasound done due to acute on chronic renal failure and showed increased cortical echogenicity compatible with medical renal disease.  Therapy was working and patient was limited by dizziness, decreased visual acuity, vestibular impairments as well as hemiplegia with ataxia.  CIR was recommended due to functional decline.   Hospital Course: Christina Orozco was admitted to rehab 12/23/2021 for inpatient therapies to consist of PT, ST and OT at least three hours five days a week. Past admission physiatrist, therapy team and rehab RN have worked together to provide customized collaborative inpatient rehab. Blood pressures were monitored on TID basis and well-controlled on amlodipine 5 mg daily and carvedilol 12.5 mg BID.  Her Lasix was discontinued day of discharge and follow-up labs revealed  normal potassium.  She has had issues with constipation which has been treated with MiraLAX as needed and sorbitol 60 mg dose x1. She was started on bowel regimen of Senna BID which has been effective.   Serial check of lytes showed that BUN/SCr has improved to 36/3.11.    She was maintained on Brilinta and ASA and is tolerating this without SE.  Follow up CBC showed H/H has improved to 9.4/28.8 and platelets are stable. Her mood has been stable on home dose Cymbalta and insomnia has been managed with use of trazodone at bedtime.  Hospital course was significant for development of some cough however she was COVID-negative x2 on testing. She has made good gains during her stay and is modified independent in supervised  setting.   Rehab course: During patient's stay in rehab weekly team conferences were held to monitor patient's progress, set goals and discuss barriers to discharge. At admission, patient required min assist/supervision and verbal cueing for ADLs, contact-guard assist for mobility and transfers. She has had improvement in activity tolerance, balance, postural control as well as ability to compensate for deficits.  She is able to complete ADL tasks at modified independent level and is independent for light IADLs such as simple meal prep.  She is modified independent for transfers and is able to ambulate 300 feet with rolling walker. Family was not present for family education but plans on providing supervision after discharge.    Discharge disposition: 01-Home or Self Care  Diet: Heart healthy  Special Instructions: 1.  No driving or strenuous activity till cleared by MD. 2.  Recommend follow-up CBC in 1 to 2 weeks to monitor H&H.   Discharge Instructions     Ambulatory referral to Neurology   Complete by: As directed    An appointment is requested in approximately: 4 weeks   Ambulatory referral to Occupational Therapy   Complete by: As directed    Eval and treat   Ambulatory referral to Physical Medicine Rehab   Complete by: As directed    Ambulatory referral to Physical Therapy   Complete by: As directed    Eval and treat      Allergies as of 12/30/2021       Reactions   Lisinopril Swelling, Other (See Comments)   Facial swelling    Penicillins Hives   Has patient had a PCN reaction causing immediate rash, facial/tongue/throat swelling, SOB or lightheadedness with hypotension: yes Has patient had a PCN reaction causing severe rash involving mucus membranes or skin necrosis: No Has patient had a PCN reaction that required hospitalization No Has patient had a PCN reaction occurring within the last 10 years: No If all of the above answers are "NO", then may proceed wit    Gabapentin Swelling, Other (See Comments)   "I thought I was having another stroke."         Medication List     STOP taking these medications    Cholecalciferol 50 MCG (2000 UT) Tabs   fluticasone 50 MCG/ACT nasal spray Commonly known as: FLONASE   furosemide 20 MG tablet Commonly known as: LASIX   hydrALAZINE 50 MG tablet Commonly known as: APRESOLINE   meclizine 25 MG tablet Commonly known as: ANTIVERT   Vitamin D3 50 MCG (2000 UT) capsule       TAKE these medications    acetaminophen 325 MG tablet Commonly known as: TYLENOL Take 1-2 tablets (325-650 mg total) by mouth every 4 (four) hours as needed for mild  pain.   albuterol 108 (90 Base) MCG/ACT inhaler Commonly known as: ProAir HFA INHALE 1 OR 2 PUFFS INTO THE LUNGS EVERY 6 HOURS AS NEEDED FOR WHEEZING OR SHORTNESS OF BREATH What changed:  how much to take how to take this when to take this reasons to take this additional instructions   amLODipine 5 MG tablet Commonly known as: NORVASC Take 1 tablet (5 mg total) by mouth daily.   aspirin EC 81 MG tablet Take 1 tablet (81 mg total) by mouth daily.   atorvastatin 80 MG tablet Commonly known as: LIPITOR Take 80 mg by mouth at bedtime.   Brilinta 60 MG Tabs tablet Generic drug: ticagrelor Take 60 mg by mouth 2 (two) times daily.   carboxymethylcellulose 0.5 % Soln Commonly known as: REFRESH PLUS Place 1 drop into both eyes daily as needed (dry eyes).   carvedilol 12.5 MG tablet Commonly known as: COREG Take 1 tablet (12.5 mg total) by mouth 2 (two) times daily with a meal.   diclofenac Sodium 1 % Gel Commonly known as: VOLTAREN Apply 2 g topically daily as needed (pain).   DULoxetine 60 MG capsule Commonly known as: CYMBALTA Take 60 mg by mouth at bedtime.   glucose blood test strip Commonly known as: Contour Next Test 1 each by Other route 2 (two) times daily. And lancets 2/day What changed: additional instructions   methocarbamol  500 MG tablet Commonly known as: ROBAXIN Take 1 tablet (500 mg total) by mouth every 6 (six) hours as needed for muscle spasms.   montelukast 10 MG tablet Commonly known as: SINGULAIR Take 10 mg by mouth at bedtime.   Muscle Rub 10-15 % Crea Apply 1 Application topically 2 (two) times daily as needed for muscle pain.   Ozempic (0.25 or 0.5 MG/DOSE) 2 MG/1.5ML Sopn Generic drug: Semaglutide(0.25 or 0.5MG /DOS) Inject 0.5 mg into the skin See admin instructions. Taking every 10 days   pantoprazole 40 MG tablet Commonly known as: PROTONIX Take 1 tablet (40 mg total) by mouth daily. What changed: See the new instructions.   senna-docusate 8.6-50 MG tablet Commonly known as: Senokot-S Take 2 tablets by mouth 2 (two) times daily.   traZODone 50 MG tablet Commonly known as: DESYREL Take 1 tablet (50 mg total) by mouth at bedtime as needed for sleep.        Follow-up Information     Jenel Lucks, PA-C. Call.   Specialty: Internal Medicine Why: Call in 1-2 days to make arrangements for hospital follow-up appointment Contact information: [place] [address] [phone]         Skeet Latch, MD. Go to.   Specialty: Cardiology Why: 01/23/2022 Contact information: [address] [place] [address] [place] [address] [place], [place] [facility] Follow up.   Why: Go to appointment as previously arranged Contact information: [place] [address] [phone]         Charlett Blake, MD Follow up.   Specialty: Physical Medicine and Rehabilitation Why: office will call you with follow up appointment Contact information: [place] [address] [phone]         [facility] Follow up.   Why: office will call you with follow up appointment Contact information: [address]     [address] [place]  [address] [phone]  Signed: Bary Leriche 12/30/2021, 5:04 PM

## 2021-12-27 LAB — SARS CORONAVIRUS 2 BY RT PCR: SARS Coronavirus 2 by RT PCR: NEGATIVE

## 2021-12-27 LAB — GLUCOSE, CAPILLARY
Glucose-Capillary: 143 mg/dL — ABNORMAL HIGH (ref 70–99)
Glucose-Capillary: 146 mg/dL — ABNORMAL HIGH (ref 70–99)

## 2021-12-27 NOTE — Progress Notes (Signed)
PROGRESS NOTE   Subjective/Complaints:  No issues overnite, no neck pain   Review of Systems  No fever, chills, HA, Abd pain. Nausea has resolved. + constipation.  Objective:   No results found. Recent Labs    12/26/21 0547  WBC 6.0  HGB 9.4*  HCT 28.8*  PLT 210    Recent Labs    12/26/21 0547  NA 141  K 3.9  CL 110  CO2 23  GLUCOSE 95  BUN 36*  CREATININE 3.11*  CALCIUM 9.4     Intake/Output Summary (Last 24 hours) at 12/27/2021 0741 Last data filed at 12/26/2021 2000 Gross per 24 hour  Intake 840 ml  Output --  Net 840 ml         Physical Exam: Vital Signs Blood pressure 130/74, pulse 74, temperature 97.7 F (36.5 C), temperature source Oral, resp. rate 18, height 5\' 2"  (1.575 m), weight 61.5 kg, last menstrual period 05/22/2018, SpO2 100 %.  Physical Exam: Height 5\' 2"  (1.575 m), weight 61.5 kg, last menstrual period 05/22/2018. Gen: no distress, normal appearing, sitting in bed  General: No acute distress Mood and affect are appropriate Heart: Regular rate and rhythm no rubs murmurs or extra sounds Lungs: Clear to auscultation, breathing unlabored, no rales or wheezes Abdomen: Positive bowel sounds, soft nontender to palpation, nondistended Extremities: No clubbing, cyanosis, or edema Skin: No evidence of breakdown, no evidence of rash Neurologic: Cranial nerves II through XII intact, motor strength is 5/5 in right and 4- left deltoid, bicep, tricep, grip, hip flexor, knee extensors, ankle dorsiflexor and plantar flexor Sensory exam normal sensation to light touch bilateral upper and lower extremities  Musculoskeletal: tenderness to palp cervical paraspinals Left >R     Assessment/Plan: 1. Functional deficits which require 3+ hours per day of interdisciplinary therapy in a comprehensive inpatient rehab setting. Physiatrist is providing close team supervision and 24 hour management of  active medical problems listed below. Physiatrist and rehab team continue to assess barriers to discharge/monitor patient progress toward functional and medical goals  Care Tool:  Bathing    Body parts bathed by patient: Right arm, Right lower leg, Left lower leg, Face, Left arm, Chest, Abdomen, Front perineal area, Buttocks, Right upper leg, Left upper leg         Bathing assist Assist Level: Supervision/Verbal cueing     Upper Body Dressing/Undressing Upper body dressing   What is the patient wearing?: Pull over shirt    Upper body assist Assist Level: Supervision/Verbal cueing    Lower Body Dressing/Undressing Lower body dressing      What is the patient wearing?: Underwear/pull up, Pants     Lower body assist Assist for lower body dressing: Supervision/Verbal cueing     Toileting Toileting    Toileting assist Assist for toileting: Supervision/Verbal cueing     Transfers Chair/bed transfer  Transfers assist     Chair/bed transfer assist level: Supervision/Verbal cueing     Locomotion Ambulation   Ambulation assist      Assist level: Contact Guard/Touching assist Assistive device: Walker-rolling Max distance: 195   Walk 10 feet activity   Assist     Assist level: Supervision/Verbal cueing  Assistive device: Walker-rolling   Walk 50 feet activity   Assist    Assist level: Contact Guard/Touching assist Assistive device: Walker-rolling    Walk 150 feet activity   Assist    Assist level: Contact Guard/Touching assist Assistive device: Walker-rolling    Walk 10 feet on uneven surface  activity   Assist     Assist level: Contact Guard/Touching assist Assistive device: Walker-rolling   Wheelchair     Assist Is the patient using a wheelchair?: No Type of Wheelchair: Manual Wheelchair activity did not occur: N/A         Wheelchair 50 feet with 2 turns activity    Assist    Wheelchair 50 feet with 2 turns  activity did not occur: N/A       Wheelchair 150 feet activity     Assist  Wheelchair 150 feet activity did not occur: N/A       Blood pressure 130/74, pulse 74, temperature 97.7 F (36.5 C), temperature source Oral, resp. rate 18, height 5\' 2"  (1.575 m), weight 61.5 kg, last menstrual period 05/22/2018, SpO2 100 %.  Medical Problem List and Plan: 1. 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  Antithrombotics: -DVT/anticoagulation:  TED hose             -antiplatelet therapy: aspirin 81 mg daily, Brilinta 60 mg daily 3. Pain Management: Tylenol as needed, sportscreme and heat for neck pain - improved 7/18 4. Depression:             Continue Cymbalta DR 60 mg q HS             -antipsychotic agents: n/a             -continue trazodone prn sleep 5. Neuropsych/cognition: This patient is capable of making decisions on her own behalf. 6. Skin/Wound Care: Routine skin care checks 7. Fluids/Electrolytes/Nutrition: Routine Is and Os and follow-up chemistries 8: Hypertension: monitor and consider home hydralazine 50 mg q AM, HS -continue amlodipine 5 mg daily             -continue carvedilol 12.5 mg BID             -continue Lasix 40 mg daily Vitals:   12/26/21 1901 12/27/21 0349  BP: 137/81 130/74  Pulse: 75 74  Resp: 16 18  Temp: 97.7 F (36.5 C) 97.7 F (36.5 C)  SpO2: 100% 100%    9: Hyperlipidemia: continue Lipitor 10: Seasonal allergies: continue fulticisone and  11: CKD stage IV: baseline Scr appears to be about 2.6-3.0; currently 3.72             -follow-up BMP and trend     Latest Ref Rng & Units 12/26/2021    5:47 AM 12/24/2021    5:14 AM 12/23/2021    3:14 AM  BMP  Glucose 70 - 99 mg/dL 95  97  101   BUN 6 - 20 mg/dL 36  47  44   Creatinine 0.44 - 1.00 mg/dL 3.11  3.33  3.72   Sodium 135 -  145 mmol/L 141  141  141   Potassium 3.5 - 5.1 mmol/L 3.9  3.9  3.3  Chloride 98 - 111 mmol/L 110  109  107   CO2 22 - 32 mmol/L 23  26  27    Calcium 8.9 - 10.3 mg/dL 9.4  9.4  9.1    Close to baseline 12: DM-2: continue SSSI and CBGs (Hgb A1c = 6.3) 13: Seizure disorder: no new seizure activity; off Keppra. Will follow-up with [facility] 14: GERD: continue Protonix 15: Migraine headache history 16: Anemia, chronic disease: follow-up CBC (trending downward over last 24 months)    Latest Ref Rng & Units 12/26/2021    5:47 AM 12/24/2021    5:14 AM 12/23/2021    3:14 AM  CBC  WBC 4.0 - 10.5 K/uL 6.0  5.3  5.1   Hemoglobin 12.0 - 15.0 g/dL 9.4  8.9  8.5   Hematocrit 36.0 - 46.0 % 28.8  27.2  25.6   Platelets 150 - 400 K/uL 210  197  182     17: Hypokalemia:  (Lasix stopped 7/14)  follow-up BMP    Resolved  18. Constipation  -Miralax PRN  -give sorbitol 60mg  7/16    LOS: 4 days A FACE TO FACE EVALUATION WAS PERFORMED  Charlett Blake 12/27/2021, 7:41 AM

## 2021-12-27 NOTE — Progress Notes (Signed)
Physical Therapy Session Note  Patient Details  Name: Christina Orozco MRN: [id number] Date of Birth: 19-Jun-1970  Today's Date: 12/27/2021 PT Individual Time: 1110-1209 PT Individual Time Calculation (min): 59 min   Short Term Goals: Week 1:  PT Short Term Goal 1 (Week 1): STG = LTG d/t ELOS  Skilled Therapeutic Interventions/Progress Updates:    Pt received sitting in w/c and agreeable to therapy session. Pt donned a mask and therapy performed in her room due to pt being tested for COVID. Sit>stand w/c>RW, progressed to no AD, with CGA for safety during session. Initiated gait training in room using RW but after ~60ft transitioned to L HHA as pt demos good L LE foot clearance and stance control, gait additional ~135ft - L LE excessive adducts causing narrow BOS and balance instability.  Dynamic gait training with L HHA via sideways stepping and backwards walking targeting L LE hip abductor strengthening and dynamic balance - pt does well with sideways stepping - started with step-to pattern when going backwards,then progressed to reciprocal pattern - CGA/light min assist for balance.  B LE strengthening and dynamic standing balance circuit as follows:  - repeated sit<>stands to/from partially elevated EOB 2x 10 reps, notice pt with minor posterior LOB when going to sit back down - standing heel raises with B HHA providing min assist for balance 2x15, has a few instances of posterior>anterior LOB and educated pt on balance recovery strategies including hip strategy and stepping strategy with pt starting to utilize hip strategy to recover LOB during the exercise  Pt reports she doesn't like to loose her balance and therapist educated her on importance of working to the point of almost having LOB to improve her balance and increase her safety with functional mobility. Pt reports she has returned to using a cane at home for safety.  Dynamic standing balance via forward/backwards stepping over a  rope, no UE support, with CGA and intermittent light min assist for balance - advanced to alternating leading leg on verbal command with pt having slight increase instability with this due to having to quickly weight shift.   Transitioned to side stepping over rope, no UE support, with CGA, intermittent light min assist for balance.  Reports need to use bathroom. Gait in/out bathroom, no AD, with CGA/light min assist for steadying. Pt able to complete 3/3 toileting tasks without assist - continent of bowels. At end of session, pt left seated on EOB with needs in reach and bed alarm on.   Therapy Documentation Precautions:  Precautions Precautions: Fall Precaution Comments: L hemipareisis, constant headache and neck pain, rotator cuff injury Restrictions Weight Bearing Restrictions: No   Pain:  Reports back of neck on L side and L shoulder painful - using K-pad heating pad for pain management - rated as 8/10 - premedicated. Pt reports L knee pain stating she has a hx of gout, provided seated rest breaks for pain management.    Therapy/Group: Individual Therapy  Tawana Scale , PT, DPT, NCS, CSRS 12/27/2021, 8:03 AM

## 2021-12-27 NOTE — Progress Notes (Signed)
Physical Therapy Session Note  Patient Details  Name: Christina Orozco MRN: [id number] Date of Birth: 09-23-1970  Today's Date: 12/27/2021 PT Individual Time: 0830-0900 PT Individual Time Calculation (min): 30 min   Short Term Goals: Week 1:  PT Short Term Goal 1 (Week 1): STG = LTG d/t ELOS  Skilled Therapeutic Interventions/Progress Updates:    Session focused on functional mobility, dynamic balance, and gait with RW. Pt performed bed mobility independently and donned socks and shoes independently. Pt performed transfers throughout session with overall supervision. Used RW during session due to L knee discomfort to off load joint. Mild instability noted during more dynamic activities but uses UE's for balance and does not require intervention by the PT. Pt instructed in gait in hallway x 150' with RW with cues for posture. Dynamic gait training through obstacle course with focus on coordination and control with toe taps and then stepping over the cones x 20' each direction with R and then L foot leading (CGA for balance). End of session walked back to room with supervision with RW and set up with breakfast tray awaiting next therapists.   Therapy Documentation Precautions:  Precautions Precautions: Fall Precaution Comments: L hemipareisis, constant headache and neck pain, rotator cuff injury Restrictions Weight Bearing Restrictions: No    Pain: Reports discomfort in L hand - medication given by RN. Pt also reports some discomfort in L knee but pt states she thinks it is arthritis or gout.     Therapy/Group: Individual Therapy  Canary Brim Ivory Broad, PT, DPT, CBIS  12/27/2021, 9:15 AM

## 2021-12-27 NOTE — Progress Notes (Signed)
Occupational Therapy Session Note  Patient Details  Name: Christina Orozco MRN: [id number] Date of Birth: March 12, 1971  Today's Date: 12/27/2021 OT Individual Time: 0906-1003 session 1 OT Individual Time Calculation (min): 57 min  Session 2: 1436-1535   Short Term Goals: Week 1:  OT Short Term Goal 1 (Week 1): Pt will complete grooming at the sink with close supervision LRAD PRN. OT Short Term Goal 2 (Week 1): Pt will complete UB and LB dressing with close supervision including item retrieval LRAD PRN. OT Short Term Goal 3 (Week 1): Pt will complete toileting and toilet transfer with close supervision for safety LRAD PRN. OT Short Term Goal 4 (Week 1): Pt will complete light meal prep task with close supervision for safety with LRAD PRN.  Skilled Therapeutic Interventions/Progress Updates:  Session 1: Pt greeted seated in w/c finishing breakfast, pt agreeable to OT intervention. While pt finishing breakfast discussed fall -prevention/energy conservation startegies for home such as using RW bag with pt reporting she already has one. Discussed required IADL tasks for home with pt reporting she will likely only have to complete a simple meal prep task at home.  Pt completed functional ambulation into shower with Rw and supervision. Pt completed bathing seated on shower seat with supervision. Pt stood from shower with CGA however pt reports dizziness needing to sit back down feeling like her 'face is falling off."  BP 108/80( 91) HR 79 Squat pivot from shower seat to w/c with CGA for + safety, LPN aware and NT enter to check blood sugar 143. Pt transported out of bathroom in w/c and pt needed increased seated rest break to reports "feeling normal" BP reassessed 128/75 ( 93) HR 77  Pt completed dressing from w/c with pt completing UB dressing MOD I from w/c and supervision for LB dressing only to provide + safety d/t dizziness earlier in session. Pt left seated in w/c with all needs within  reach.  Session 2: Pt greeted supine in bed, pt agreeable to OT intervention.  Pt completed supine>sit MOD I with pt able to ambulate to bathroom with rw and supervision to complete 3/3 toileting tasks with supervision. Pt stood at sink with supervision to wash hands.  Worked on standing tolerance with pt abel to stand for 8 mins to play "go fish" game to facilitate improved standing tolerance and LUE FMC, pt completed task with supervision, mild [place] deficits noted when organizing cards but overall WFL, pt was noted to need MIN cues to recall rules/sequence of game.  Pt completed below BUE/BLE therex to increase overall strength/endurance with level 2 theraband:  X10 shoulder flexion  X10 bicep curls X10 shoulder horizontal ABD X10 shoulder diagonal pulls X10 shoulder extension  X20 alternating punches X20 LAQS X20 hip ABD/ADD X10 standing squats  Pt completed functional ambulation task in room to simulate IADL task of cleaning house with pt able to reach out of BOS and even to floor level to retrieve wash cloths from floor level with supervision and no LOB.  Pt left supine in bed with all needs within reach.  No dizziness reported during session.   Therapy Documentation Precautions:  Precautions Precautions: Fall Precaution Comments: L hemipareisis, constant headache and neck pain, rotator cuff injury Restrictions Weight Bearing Restrictions: No    Pain: Session 1: no pain reported during session  Session 2: unrated pain reported in LUE during therex, rest breaks and education provided on decreasing AROM as pain mgmt strategy.     Therapy/Group: Individual Therapy  Corinne Ports [facility] 12/27/2021, 12:09 PM

## 2021-12-28 NOTE — Progress Notes (Signed)
Patient ID: Christina Orozco, female   DOB: 1970-08-11, 51 y.o.   MRN: [id number]  Team Conference Report to Patient/Family  Team Conference discussion was reviewed with the patient and caregiver, including goals, any changes in plan of care and target discharge date.  Patient and caregiver express understanding and are in agreement.  The patient has a target discharge date of 01/03/22.  SW met with patient and provided team conference updates. Patient expresses that she is ready to be tested tomorrow to d/c precautions. Sw aware that patient is requiring a letter for court. Sw will provide to patient before d/c. No additional questions or concerns. [name] 12/28/2021, 1:35 PM

## 2021-12-28 NOTE — Progress Notes (Signed)
Occupational Therapy Session Note  Patient Details  Name: Christina Orozco MRN: [id number] Date of Birth: May 17, 1971  Today's Date: 12/28/2021 OT Individual Time: 1114-1200 session 1 OT Individual Time Calculation (min): 46 min  Session 2: 5027-7412   Short Term Goals: Week 1:  OT Short Term Goal 1 (Week 1): Pt will complete grooming at the sink with close supervision LRAD PRN. OT Short Term Goal 2 (Week 1): Pt will complete UB and LB dressing with close supervision including item retrieval LRAD PRN. OT Short Term Goal 3 (Week 1): Pt will complete toileting and toilet transfer with close supervision for safety LRAD PRN. OT Short Term Goal 4 (Week 1): Pt will complete light meal prep task with close supervision for safety with LRAD PRN.  Skilled Therapeutic Interventions/Progress Updates:  Session 1: Pt greeted supine in bed, pt  agreeable to OT intervention. Pt completed supine>sit MOD I and able to ambulate around room with distant supervision with RW to collect clothes for bathing, pt completed 3/3 toileting tasks MOD I, pt entered shower with supervision and RW, pt completed all bathing from shower seat with supervision. Pt exited shower with Rw and supervision. Pt completed dressing from w/c MOD I with RW as needed.  Pt ambulated out of bathroom with Rw and supervision with pt able to stand at sink with supervision to complete oral care.  Remainder of session focused on further visual testing using line bisection test with no deficits/nystagmus noted. Pt also demo'ed functional ability to wrist with her L hand to work on [facility] in L hand. Pt left seated in w/c with all needs within reach.   Session 2: Pt greeted seated in w/c  agreeable to OT intervention.  Pt completed below seated therex to facilitate increased strength/endurance in BUE/BLE: OH pulls with level 2 theraband x20 reps Rows with level 2 theraband x20 reps Shoulder flexion in PNF patterns x20 reps  -Pt completed Seated  handwriting tasks with LUE where pt instructed to cross out letters in sequential order focusing on visual scanning and LUE coordination, pt completed task with supervision with 100% accuracy and no deficits noted, did complete further Visual testing via visual scanning assessment with nystagmus noted. -Worked on intrinsic strength and [place] via therapeutic activity where pt instructed to grasp small compliant cubes and place cubes in cup on table with a focus on pincer grasp, pt able to alternate opposing all digits with no deficits noted. - pt completed Functional mobility obstacle in room with pt stepping over obstacles with RW and walking across uneven sufaces with Rw and supervision, no LOB noted  - pt completed Dynamic reaching task on and off compliant cushion with no LOB with pt instructed to reach out of BOS to stack ADL items OH on counter with no UE support  Pt completed below LB therex for strengthening and balance Standing at bar in bathroom: - standing hip ABD/ADD x10 reps - standing "donkey kicks" backwards for glute strengthening and dynamic balance x10 reps - 1 min of standing marches holding onto bar  While in bathroom pt reports need to void b/b, MOD I for all toileting tasks.   Pt able to transition into Quadraped ( crawled up onto bed)  on bed with CGA needed MIN A to elevate opposite arm and leg together for NMR, x5 reps each extremity Pt completed Standing push ups on bed rail x20 reps with CGA  Pt reports fatigue in UEs needing to sit and rest, reviewed DC date and DME  needs and discussed pts usual IADLS with pt reporting she doesn't drive and her daughter takes her to the grocery store, she reports using a motorized cart in the store.  Pt left seated in w/c with all needs within reach.    Therapy Documentation Precautions:  Precautions Precautions: Fall Precaution Comments: L hemipareisis, constant headache and neck pain, rotator cuff injury Restrictions Weight Bearing  Restrictions: No  Pain: No pain reported during either session     Therapy/Group: Individual Therapy  Christina Orozco [facility] 12/28/2021, 1:03 PM

## 2021-12-28 NOTE — Patient Care Conference (Signed)
Inpatient RehabilitationTeam Conference and Plan of Care Update Date: 12/28/2021   Time: 10:40 AM    Patient Name: Christina Orozco      Medical Record Number: [id number]  Date of Birth: Nov 13, 1970 Sex: Female         Room/Bed: 4M06C/4M06C-01 Payor Info: Payor: [place] / Plan: [place] / Product Type: *No Product type* /    Admit Date/Time:  12/23/2021  4:19 PM  Primary Diagnosis:  Acute embolic stroke [facility])  Hospital Problems: Principal Problem:   Acute embolic stroke ([place]) Active Problems:   Anemia, chronic disease   Chronic kidney disease (CKD), stage IV (severe) [facility])    Expected Discharge Date: Expected Discharge Date: 01/03/22  Team Members Present: Physician leading conference: Dr. Alysia Penna Social Worker Present: Erlene Quan, BSW Nurse Present: Dorien Chihuahua, RN PT Present: Page Spiro, PT OT Present: Willeen Cass, OT;Mary Jabier Gauss, COTA SLP Present: Weston Anna, SLP PPS Coordinator present : Gunnar Fusi, SLP     Current Status/Progress Goal Weekly Team Focus  Bowel/Bladder     continent        Swallow/Nutrition/ Hydration             ADL's   overall supervision for ambulatory ADL transfers with RW, supervision for bathing from shower seat, supervision for dressing. did have episode of dizziness exiting shower yesterday with BP 108/80 needing several minutes to sit and rest, sugar was [facility]  supervision- MOD I  family ed, balance training, BADL reeducation   Mobility   supervision bed mobility, CGA/supervision sit<>stand and stand pivot transfers without AD, light min assist gait up to 160ft without AD and CGA using RW up to 131ft, no steps at home  mod-I/supervision overall at ambulatory level  activity tolerance, dynamic standing balance, dynamic gait training with LRAD, L hemibody NMR, pt education, DME training   Communication             Safety/Cognition/ Behavioral Observations             Pain     Tylenol and muscle relaxer for neck pain and heat pack   Pain controlled   Monitor need for and effectiveness of prns  Skin     N/a          Discharge Planning:  Discharging home with assistance from daughter who is home for the summer. Able to provide up to [place] , 24/7   Team Discussion: Patient with stable Hgb per MD; doing well.  Patient on target to meet rehab goals: yes, Needs supervision overall for ADLs, bathing at the shower, dressing, sit- stand and stand pivot transfers.  Needs min assist for long distance ambulate with or without a walker. Goals for discharge set for mod I overall.  *See Care Plan and progress notes for long and short-term goals.   Revisions to Treatment Plan:  N/a  Teaching Needs: Safety, medications, transfers, etc.  Current Barriers to Discharge: Decreased caregiver support  Possible Resolutions to Barriers: Family education OP follow up services DME: RW ;already has a TTB     Medical Summary Current Status: cough x 1 await repeat Covid 19 PCR in am  Barriers to Discharge: Medical stability;Other (comments)  Barriers to Discharge Comments: unable to go outside of room due to isolation precautions Possible Resolutions to Barriers/Weekly Focus: resume therapy in gym once cleared by ID   Continued Need for Acute Rehabilitation Level of Care: The patient requires daily medical management by a  physician with specialized training in physical medicine and rehabilitation for the following reasons: Direction of a multidisciplinary physical rehabilitation program to maximize functional independence : Yes Medical management of patient stability for increased activity during participation in an intensive rehabilitation regime.: Yes Analysis of laboratory values and/or radiology reports with any subsequent need for medication adjustment and/or medical intervention. : Yes   I attest that I was present, lead the team conference, and concur with  the assessment and plan of the team.   Dorien Chihuahua B 12/28/2021, 5:01 PM

## 2021-12-28 NOTE — Progress Notes (Signed)
PROGRESS NOTE   Subjective/Complaints:  Pt with cough yesterday am , no coughing today , feels fine, strength improving LUE but still with coordination problems   Review of Systems  No fever, chills, HA, Abd pain. Nausea has resolved. - constipation.  Objective:   No results found. Recent Labs    12/26/21 0547  WBC 6.0  HGB 9.4*  HCT 28.8*  PLT 210    Recent Labs    12/26/21 0547  NA 141  K 3.9  CL 110  CO2 23  GLUCOSE 95  BUN 36*  CREATININE 3.11*  CALCIUM 9.4     Intake/Output Summary (Last 24 hours) at 12/28/2021 0751 Last data filed at 12/27/2021 1743 Gross per 24 hour  Intake 826 ml  Output --  Net 826 ml         Physical Exam: Vital Signs Blood pressure 140/84, pulse 83, temperature 98.2 F (36.8 C), temperature source Oral, resp. rate 14, height _0  (1.575 m), weight 61.5 kg, last menstrual period 05/22/2018, SpO2 100 %.  Physical Exam: Height _1  (1.575 m), weight 61.5 kg, last menstrual period 05/22/2018. Gen: no distress, normal appearing, sitting in bed  General: No acute distress Mood and affect are appropriate Heart: Regular rate and rhythm no rubs murmurs or extra sounds Lungs: Clear to auscultation, breathing unlabored, no rales or wheezes Abdomen: Positive bowel sounds, soft nontender to palpation, nondistended Extremities: No clubbing, cyanosis, or edema Skin: No evidence of breakdown, no evidence of rash Neurologic: Cranial nerves II through XII intact, motor strength is 5/5 in right and 4- left deltoid, bicep, tricep, grip, hip flexor, knee extensors, ankle dorsiflexor and plantar flexor Sensory exam normal sensation to light touch bilateral upper and lower extremities  Musculoskeletal: tenderness to palp cervical paraspinals Left >R     Assessment/Plan: 1. Functional deficits which require 3+ hours per day of interdisciplinary therapy in a comprehensive inpatient rehab  setting. Physiatrist is providing close team supervision and 24 hour management of active medical problems listed below. Physiatrist and rehab team continue to assess barriers to discharge/monitor patient progress toward functional and medical goals  Care Tool:  Bathing    Body parts bathed by patient: Right arm, Right lower leg, Left lower leg, Face, Left arm, Chest, Abdomen, Front perineal area, Buttocks, Right upper leg, Left upper leg         Bathing assist Assist Level: Supervision/Verbal cueing     Upper Body Dressing/Undressing Upper body dressing   What is the patient wearing?: Pull over shirt    Upper body assist Assist Level: Independent with assistive device    Lower Body Dressing/Undressing Lower body dressing      What is the patient wearing?: Underwear/pull up, Pants     Lower body assist Assist for lower body dressing: Supervision/Verbal cueing     Toileting Toileting    Toileting assist Assist for toileting: Supervision/Verbal cueing     Transfers Chair/bed transfer  Transfers assist     Chair/bed transfer assist level: Contact Guard/Touching assist     Locomotion Ambulation   Ambulation assist      Assist level: Contact Guard/Touching assist Assistive device: No Device Max distance:  192f   Walk 10 feet activity   Assist     Assist level: Supervision/Verbal cueing Assistive device: Walker-rolling   Walk 50 feet activity   Assist    Assist level: Supervision/Verbal cueing Assistive device: Walker-rolling    Walk 150 feet activity   Assist    Assist level: Supervision/Verbal cueing Assistive device: Walker-rolling    Walk 10 feet on uneven surface  activity   Assist     Assist level: Contact Guard/Touching assist Assistive device: Walker-rolling   Wheelchair     Assist Is the patient using a wheelchair?: No Type of Wheelchair: Manual Wheelchair activity did not occur: N/A         Wheelchair 50  feet with 2 turns activity    Assist    Wheelchair 50 feet with 2 turns activity did not occur: N/A       Wheelchair 150 feet activity     Assist  Wheelchair 150 feet activity did not occur: N/A       Blood pressure 140/84, pulse 83, temperature 98.2 F (36.8 C), temperature source Oral, resp. rate 14, height _0  (1.575 m), weight 61.5 kg, last menstrual period 05/22/2018, SpO2 100 %.  Medical Problem List and Plan: 1. 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 Formulized individual treatment plan based on medical history, underlying problem and comorbidities.  2.  Antithrombotics: -DVT/anticoagulation:  TED hose             -antiplatelet therapy: aspirin 81 mg daily, Brilinta 60 mg daily 3. Pain Management: Tylenol as needed, sportscreme and heat for neck pain - improved 7/18 4. Depression:             Continue Cymbalta DR 60 mg q HS             -antipsychotic agents: n/a             -continue trazodone prn sleep 5. Neuropsych/cognition: This patient is capable of making decisions on her own behalf. 6. Skin/Wound Care: Routine skin care checks 7. Fluids/Electrolytes/Nutrition: Routine Is and Os and follow-up chemistries 8: Hypertension: monitor and consider home hydralazine 50 mg q AM, HS -continue amlodipine 5 mg daily             -continue carvedilol 12.5 mg BID             -continue Lasix 40 mg daily Vitals:   12/27/21 2022 12/28/21 0601  BP: 133/79 140/84  Pulse: 81 83  Resp: 18 14  Temp: 97.8 F (36.6 C) 98.2 F (36.8 C)  SpO2: 100% 100%    9: Hyperlipidemia: continue Lipitor 10: Seasonal allergies: continue fulticisone and  11: CKD stage IV:  baseline Scr appears to be about 2.6-3.0; currently 3.72             -follow-up BMP and trend     Latest Ref Rng & Units 12/26/2021    5:47 AM 12/24/2021    5:14 AM 12/23/2021    3:14 AM  BMP  Glucose 70 - 99 mg/dL 95  97  101  BUN 6 - 20 mg/dL 36  47  44   Creatinine 0.44 - 1.00 mg/dL 3.11  3.33  3.72   Sodium 135 - 145 mmol/L 141  141  141   Potassium 3.5 - 5.1 mmol/L 3.9  3.9  3.3   Chloride 98 - 111 mmol/L 110  109  107   CO2 22 - 32 mmol/L _0 Calcium 8.9 - 10.3 mg/dL 9.4  9.4  9.1    Close to baseline 12: DM-2: continue SSSI and CBGs (Hgb A1c = 6.3) 13: Seizure disorder: no new seizure activity; off Keppra. Will follow-up with [facility] 14: GERD: continue Protonix 15: Migraine headache history 16: Anemia, chronic disease: follow-up CBC (trending downward over last 24 months)    Latest Ref Rng & Units 12/26/2021    5:47 AM 12/24/2021    5:14 AM 12/23/2021    3:14 AM  CBC  WBC 4.0 - 10.5 K/uL 6.0  5.3  5.1   Hemoglobin 12.0 - 15.0 g/dL 9.4  8.9  8.5   Hematocrit 36.0 - 46.0 % 28.8  27.2  25.6   Platelets 150 - 400 K/uL 210  197  182     17: Hypokalemia:  (Lasix stopped 7/14)  follow-up BMP    Resolved  18. Constipation  -Miralax PRN  -give sorbitol 44m 7/16   19.  Cough yesterday resolved Covid -, cont contact and respiratory precaution per ID  LOS: 5 days A FACE TO FACE EVALUATION WAS PERFORMED  ACharlett Blake7/19/2023, 7:51 AM

## 2021-12-28 NOTE — Progress Notes (Signed)
Physical Therapy Session Note  Patient Details  Name: Christina Orozco MRN: [id number] Date of Birth: 1970/11/14  Today's Date: 12/28/2021 PT Individual Time: 1640-1735 PT Individual Time Calculation (min): 55 min   Short Term Goals: Week 1:  PT Short Term Goal 1 (Week 1): STG = LTG d/t ELOS  Skilled Therapeutic Interventions/Progress Updates:    Pt received sitting in w/c and agreeable to therapy session. Sit<>stands, no AD, with close supervision during session. Educated pt on wearing tennis shoes rather than sandals for increased safety and decrease fall risk. Pt able to ambulate in room, no AD, to collect socks and tennis shoes off the floor with CGA for safety. Donned shoes without assist.  Gait training ~145ft in pt's room, no AD, with CGA and only 2x instances of light min assist due to R postero-lateral LOB - continues to have slightly excessive L LE adduction and internal rotation during swing.  Dynamic gait training alternating between backwards walking and fast, forward walking x5 laps in her room with CGA and intermittent min assist due to minor R posterior LOB with backwards walking. Increased step length noted with backwards walking today.  Dynamic gait training using level 3 theraband resistance around knees during sideways stepping to increase hip abductor activation x5 laps in room - continues to have minor LOBs primarily posteriorly during this requiring intermittent light min assist.  Dynamic balance recovery training of the stepping strategy by having pt lean back into therapist's support and removing the support at a known progressed to an unknown time -  requires up to mod assist to maintain balance in beginning progressed to CGA with pt stepping back leading with R LE, unable to step back leading with L LE due to pt feeling more imbalanced that way.  Standing heel raises with B HHA progressed to no UE support 2x15reps with CGA occasional light min assist for  steadying/balance due to minor posterior lean.   Supine<>sit on bed mod-I using bedrail primarily to return to sitting with increased effort noted.  Supine bridges with level 3 theraband resistance around knees for increased hip abductor activation 2x20reps - requires pulse at top of the bridge to increase glute activation.   R sidelying L LE clamshell 2x20 reps with cuing for proper form/technique.   At end of session, pt left seated in w/c with needs in reach and nurse aware of pt's position.  Therapy Documentation Precautions:  Precautions Precautions: Fall Precaution Comments: L hemipareisis, constant headache and neck pain, rotator cuff injury Restrictions Weight Bearing Restrictions: No   Pain:  No complaints of pain throughout session.    Therapy/Group: Individual Therapy  Tawana Scale , PT, DPT, NCS, CSRS 12/28/2021, 4:49 PM

## 2021-12-29 LAB — SARS CORONAVIRUS 2 BY RT PCR: SARS Coronavirus 2 by RT PCR: NEGATIVE

## 2021-12-29 NOTE — Progress Notes (Signed)
PROGRESS NOTE   Subjective/Complaints:  Pt happy with d/c date ,no cough  Review of Systems  No fever, chills, HA, Abd pain.  Objective:   No results found. No results for input(s): "WBC", "HGB", "HCT", "PLT" in the last 72 hours.  No results for input(s): "NA", "K", "CL", "CO2", "GLUCOSE", "BUN", "CREATININE", "CALCIUM" in the last 72 hours.   Intake/Output Summary (Last 24 hours) at 12/29/2021 0749 Last data filed at 12/28/2021 1812 Gross per 24 hour  Intake 476 ml  Output --  Net 476 ml         Physical Exam: Vital Signs Blood pressure (!) 144/80, pulse 80, temperature 98.2 F (36.8 C), temperature source Oral, resp. rate 17, height 5\' 2"  (1.575 m), weight 61.5 kg, last menstrual period 05/22/2018, SpO2 100 %.  Physical Exam: Height 5\' 2"  (1.575 m), weight 61.5 kg, last menstrual period 05/22/2018. Gen: no distress, normal appearing, sitting in bed  General: No acute distress Mood and affect are appropriate Heart: Regular rate and rhythm no rubs murmurs or extra sounds Lungs: Clear to auscultation, breathing unlabored, no rales or wheezes Abdomen: Positive bowel sounds, soft nontender to palpation, nondistended Extremities: No clubbing, cyanosis, or edema Skin: No evidence of breakdown, no evidence of rash Neurologic: Cranial nerves II through XII intact, motor strength is 5/5 in right and 4+left deltoid, bicep, tricep, grip, hip flexor, knee extensors, ankle dorsiflexor and plantar flexor        Assessment/Plan: 1. Functional deficits which require 3+ hours per day of interdisciplinary therapy in a comprehensive inpatient rehab setting. Physiatrist is providing close team supervision and 24 hour management of active medical problems listed below. Physiatrist and rehab team continue to assess barriers to discharge/monitor patient progress toward functional and medical goals  Care Tool:  Bathing     Body parts bathed by patient: Right arm, Right lower leg, Left lower leg, Face, Left arm, Chest, Abdomen, Front perineal area, Buttocks, Right upper leg, Left upper leg         Bathing assist Assist Level: Supervision/Verbal cueing     Upper Body Dressing/Undressing Upper body dressing   What is the patient wearing?: Pull over shirt    Upper body assist Assist Level: Independent with assistive device    Lower Body Dressing/Undressing Lower body dressing      What is the patient wearing?: Underwear/pull up, Pants     Lower body assist Assist for lower body dressing: Supervision/Verbal cueing     Toileting Toileting    Toileting assist Assist for toileting: Supervision/Verbal cueing     Transfers Chair/bed transfer  Transfers assist     Chair/bed transfer assist level: Contact Guard/Touching assist     Locomotion Ambulation   Ambulation assist      Assist level: Contact Guard/Touching assist Assistive device: No Device Max distance: 167ft   Walk 10 feet activity   Assist     Assist level: Supervision/Verbal cueing Assistive device: Walker-rolling   Walk 50 feet activity   Assist    Assist level: Supervision/Verbal cueing Assistive device: Walker-rolling    Walk 150 feet activity   Assist    Assist level: Supervision/Verbal cueing Assistive device: Walker-rolling  Walk 10 feet on uneven surface  activity   Assist     Assist level: Contact Guard/Touching assist Assistive device: Walker-rolling   Wheelchair     Assist Is the patient using a wheelchair?: No Type of Wheelchair: Manual Wheelchair activity did not occur: N/A         Wheelchair 50 feet with 2 turns activity    Assist    Wheelchair 50 feet with 2 turns activity did not occur: N/A       Wheelchair 150 feet activity     Assist  Wheelchair 150 feet activity did not occur: N/A       Blood pressure (!) 144/80, pulse 80, temperature 98.2 F  (36.8 C), temperature source Oral, resp. rate 17, height 5\' 2"  (1.575 m), weight 61.5 kg, last menstrual period 05/22/2018, SpO2 100 %.  Medical Problem List and Plan: 1. 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  Antithrombotics: -DVT/anticoagulation:  TED hose             -antiplatelet therapy: aspirin 81 mg daily, Brilinta 60 mg daily 3. Pain Management: Tylenol as needed, sportscreme and heat for neck pain - improved 7/18 4. Depression:             Continue Cymbalta DR 60 mg q HS             -antipsychotic agents: n/a             -continue trazodone prn sleep 5. Neuropsych/cognition: This patient is capable of making decisions on her own behalf. 6. Skin/Wound Care: Routine skin care checks 7. Fluids/Electrolytes/Nutrition: Routine Is and Os and follow-up chemistries 8: Hypertension: monitor and consider home hydralazine 50 mg q AM, HS -continue amlodipine 5 mg daily             -continue carvedilol 12.5 mg BID             -continue Lasix 40 mg daily Vitals:   12/28/21 1941 12/29/21 0645  BP: 136/78 (!) 144/80  Pulse: 75 80  Resp: 18 17  Temp: 98.3 F (36.8 C) 98.2 F (36.8 C)  SpO2: 99% 100%    9: Hyperlipidemia: continue Lipitor 10: Seasonal allergies: continue fulticisone and  11: CKD stage IV: baseline Scr appears to be about 2.6-3.0; currently 3.72             -follow-up BMP and trend     Latest Ref Rng & Units 12/26/2021    5:47 AM 12/24/2021    5:14 AM 12/23/2021    3:14 AM  BMP  Glucose 70 - 99 mg/dL 95  97  101   BUN 6 - 20 mg/dL 36  47  44   Creatinine 0.44 - 1.00 mg/dL 3.11  3.33  3.72   Sodium 135 - 145 mmol/L 141  141  141   Potassium 3.5 - 5.1 mmol/L 3.9  3.9  3.3   Chloride 98 - 111 mmol/L 110  109  107   CO2 22 - 32 mmol/L 23  26  27    Calcium 8.9 - 10.3 mg/dL 9.4  9.4  9.1     Close to baseline 12: DM-2: continue SSSI  and CBGs (Hgb A1c = 6.3) 13: Seizure disorder: no new seizure activity; off Keppra. Will follow-up with [facility] 14: GERD: continue Protonix 15: Migraine headache history 16: Anemia, chronic disease: follow-up CBC (trending downward over last 24 months)    Latest Ref Rng & Units 12/26/2021    5:47 AM 12/24/2021    5:14 AM 12/23/2021    3:14 AM  CBC  WBC 4.0 - 10.5 K/uL 6.0  5.3  5.1   Hemoglobin 12.0 - 15.0 g/dL 9.4  8.9  8.5   Hematocrit 36.0 - 46.0 % 28.8  27.2  25.6   Platelets 150 - 400 K/uL 210  197  182     17: Hypokalemia:  (Lasix stopped 7/14)  follow-up BMP    Resolved  18. Constipation  -Miralax PRN  -give sorbitol 60mg  7/16   19.  Cough yesterday resolved Covid -, cont contact and respiratory precaution per ID  LOS: 6 days A FACE TO FACE EVALUATION WAS PERFORMED  Charlett Blake 12/29/2021, 7:49 AM

## 2021-12-29 NOTE — Discharge Instructions (Addendum)
Inpatient Rehab Discharge Instructions  [facility] Discharge date and time:    Activities/Precautions/ Functional Status: Activity: no lifting, driving, or strenuous exercise until cleared by MD Diet: cardiac diet Wound Care: none needed   Functional status:  ___ No restrictions     ___ Walk up steps independently _X__ 24/7 supervision/assistance   ___ Walk up steps with assistance ___ Intermittent supervision/assistance  ___ Bathe/dress independently ___ Walk with walker     ___ Bathe/dress with assistance ___ Walk Independently    ___ Shower independently _X__ Walk with supervision/walker.    _X__ Shower with assistance ___ No alcohol     ___ Return to work/school ________  COMMUNITY REFERRALS UPON DISCHARGE:      Outpatient: PT      OT                  Agency:[facility]     JKKXF:[phone]              Appointment Date/Time:*Please expect follow-up within 7-10 business days to schedule your appointment. If you have not received follow-up, be sure to contact the site directly.*  Medical Equipment/Items Ordered: rolling walker                                                 Agency/Supplier: [facility] [phone]     Special Instructions: No driving, alcohol consumption or tobacco use.   STROKE/TIA DISCHARGE INSTRUCTIONS SMOKING Cigarette smoking nearly doubles your risk of having a stroke & is the single most alterable risk factor  If you smoke or have smoked in the last 12 months, you are advised to quit smoking for your health. Most of the excess cardiovascular risk related to smoking disappears within a year of stopping. Ask you doctor about anti-smoking medications [place] Quit Line: 1-800-QUIT NOW Free Smoking Cessation Classes [phone]  CHOLESTEROL Know your levels; limit fat & cholesterol in your diet  Lipid Panel     Component Value Date/Time   CHOL 100 12/20/2021 0116   CHOL 135 06/17/2020 1053   TRIG 73 12/20/2021 0116   HDL 49 12/20/2021  0116   HDL 47 06/17/2020 1053   CHOLHDL 2.0 12/20/2021 0116   VLDL 15 12/20/2021 0116   LDLCALC 36 12/20/2021 0116   LDLCALC 63 06/17/2020 1053     Many patients benefit from treatment even if their cholesterol is at goal. Goal: Total Cholesterol (CHOL) less than 160 Goal:  Triglycerides (TRIG) less than 150 Goal:  HDL greater than 40 Goal:  LDL (LDLCALC) less than 100   BLOOD PRESSURE American Stroke Association blood pressure target is less that 120/80 mm/Hg  Your discharge blood pressure is:  BP: (!) 145/80 Monitor your blood pressure Limit your salt and alcohol intake Many individuals will require more than one medication for high blood pressure  DIABETES (A1c is a blood sugar average for last 3 months) Goal HGBA1c is under 7% (HBGA1c is blood sugar average for last 3 months)  Diabetes: Diagnosis of diabetes:  Your A1c:6.3 %    Lab Results  Component Value Date   HGBA1C 6.3 (H) 12/20/2021    Your HGBA1c can be lowered with medications, healthy diet, and exercise. Check your blood sugar as directed by your physician Call your physician if you experience unexplained or low blood sugars.  PHYSICAL ACTIVITY/REHABILITATION Goal is 30  minutes at least 4 days per week  Activity: Increase activity slowly, Therapies: Physical Therapy: Home Health Return to work: when cleared by MD Activity decreases your risk of heart attack and stroke and makes your heart stronger.  It helps control your weight and blood pressure; helps you relax and can improve your mood. Participate in a regular exercise program. Talk with your doctor about the best form of exercise for you (dancing, walking, swimming, cycling).  DIET/WEIGHT Goal is to maintain a healthy weight  Your discharge diet is:  Diet Order             Diet Heart Room service appropriate? Yes; Fluid consistency: Thin  Diet effective now                  thin liquids Your height is:  Height: 5\' 2"  (157.5 cm) Your current weight  is: Weight: 61.5 kg Your Body Mass Index (BMI) is:  BMI (Calculated): 24.79 Following the type of diet specifically designed for you will help prevent another stroke. Your goal weight range is:   Your goal Body Mass Index (BMI) is 19-24. Healthy food habits can help reduce 3 risk factors for stroke:  High cholesterol, hypertension, and excess weight.  RESOURCES Stroke/Support Group:  Call [phone]   STROKE EDUCATION PROVIDED/REVIEWED AND GIVEN TO PATIENT Stroke warning signs and symptoms How to activate emergency medical system (call 911). Medications prescribed at discharge. Need for follow-up after discharge. Personal risk factors for stroke. Pneumonia vaccine given: No Flu vaccine given: No My questions have been answered, the writing is legible, and I understand these instructions.  I will adhere to these goals & educational materials that have been provided to me after my discharge from the hospital.      My questions have been answered and I understand these instructions. I will adhere to these goals and the provided educational materials after my discharge from the hospital.  Patient/Caregiver Signature _______________________________ Date __________  Clinician Signature _______________________________________ Date __________  Please bring this form and your medication list with you to all your follow-up doctor's appointments.

## 2021-12-29 NOTE — Progress Notes (Addendum)
Patient ID: Christina Orozco, female   DOB: Oct 16, 1970, 51 y.o.   MRN: [id number]  Rolling Walker ordered through [facility]. Referral faxed to [facility]

## 2021-12-29 NOTE — Progress Notes (Signed)
Physical Therapy Session Note  Patient Details  Name: Christina Orozco MRN: [id number] Date of Birth: Jan 18, 1971  Today's Date: 12/29/2021 PT Individual Time: 1445-1511 PT Individual Time Calculation (min): 26 min   Short Term Goals: Week 1:  PT Short Term Goal 1 (Week 1): STG = LTG d/t ELOS  Skilled Therapeutic Interventions/Progress Updates:    Pt received sitting upright in bed and agreeable to therapy session. Pt able to transition into quadruped on bed in order to reach on bedside table and grab her socks with CGA for safety. Reports need to use bathroom. Sit<>stands, no AD, with CGA for safety. Gait in/out bathroom, no AD, with CGA for safety and 1x slight min assist due to very minor L LOB. Sit<>stand to/from toilet with distant supervision - pt completed all toileting tasks without assist - continent of bladder and bowels. Standing hand hygiene without assist.  Gait training ~17ft to main therapy gym, no AD, with CGA for safety/steadying - slight increased postural instability but no LOB.   Provided pt with SPC as this is the AD she used at baseline.   Gait training ~127ft using SPC with CGA for steadying/safety - pt does well managing SPC without cuing using correct reciprocal pattern with improved gait speed compared to without AD. Gait training back to room using [place] as just described. At end of session, pt left seated in bed with needs in reach.   Therapy Documentation Precautions:  Precautions Precautions: Fall Precaution Comments: L hemipareisis, constant headache and neck pain, rotator cuff injury Restrictions Weight Bearing Restrictions: No  Pain:  No complaints of pain throughout session.   Therapy/Group: Individual Therapy  Tawana Scale , PT, DPT, NCS, CSRS 12/29/2021, 12:48 PM

## 2021-12-29 NOTE — Progress Notes (Signed)
Occupational Therapy Session Note  Patient Details  Name: Christina Orozco MRN: [id number] Date of Birth: 04-03-1971  Today's Date: 12/29/2021 OT Individual Time: 6861-6837 OT Individual Time Calculation (min): 27 min    Short Term Goals: Week 1:  OT Short Term Goal 1 (Week 1): Pt will complete grooming at the sink with close supervision LRAD PRN. OT Short Term Goal 2 (Week 1): Pt will complete UB and LB dressing with close supervision including item retrieval LRAD PRN. OT Short Term Goal 3 (Week 1): Pt will complete toileting and toilet transfer with close supervision for safety LRAD PRN. OT Short Term Goal 4 (Week 1): Pt will complete light meal prep task with close supervision for safety with LRAD PRN.  Skilled Therapeutic Interventions/Progress Updates:  Pt greeted supine in bed agreeable to OT intervention. Pt completed supine>sit MOD I, pt ambulated to bathroom with rw and supervision, pt able to transport clothes into bathroom. Pt entered shower with supervision. Pt completed bathing overall with supervision from shower chair.  Pt exited shower in same manner as previously stated, pt completed dressing from w/c in bathroom with overall MODI. Pt exited bathroom with supervision with rw and able to stand at sink for grooming care with supervision.  Pt left supine in bed with all needs within reach.   Therapy Documentation Precautions:  Precautions Precautions: Fall Precaution Comments: L hemipareisis, constant headache and neck pain, rotator cuff injury Restrictions Weight Bearing Restrictions: No   Pain: No pain reported during session     Therapy/Group: Individual Therapy  Precious Haws 12/29/2021, 12:13 PM

## 2021-12-30 MED ORDER — PANTOPRAZOLE SODIUM 40 MG PO TBEC: 40.0000 mg | DELAYED_RELEASE_TABLET | Freq: Every day | ORAL | 0 refills | Status: DC

## 2021-12-30 MED ORDER — TRAZODONE HCL 50 MG PO TABS: 50.0000 mg | ORAL_TABLET | Freq: Every evening | ORAL | 0 refills | Status: DC | PRN

## 2021-12-30 MED ORDER — METHOCARBAMOL 500 MG PO TABS: 500.0000 mg | ORAL_TABLET | Freq: Four times a day (QID) | ORAL | 0 refills | Status: DC | PRN

## 2021-12-30 MED ORDER — ACETAMINOPHEN 325 MG PO TABS: 325.0000 mg | ORAL_TABLET | ORAL | Status: DC | PRN

## 2021-12-30 MED ORDER — ASPIRIN 81 MG PO TBEC: 81.0000 mg | DELAYED_RELEASE_TABLET | Freq: Every day | ORAL | 5 refills | Status: DC

## 2021-12-30 MED ORDER — MUSCLE RUB 10-15 % EX CREA: 1.0000 | TOPICAL_CREAM | Freq: Two times a day (BID) | CUTANEOUS | 0 refills | Status: DC | PRN

## 2021-12-30 MED ORDER — SENNOSIDES-DOCUSATE SODIUM 8.6-50 MG PO TABS: 2.0000 | ORAL_TABLET | Freq: Two times a day (BID) | ORAL | 0 refills | Status: DC

## 2021-12-30 NOTE — Progress Notes (Signed)
Occupational Therapy Discharge Summary  Patient Details  Name: Christina Orozco MRN: [id number] Date of Birth: 23-Jan-1971  Today's Date: 12/30/2021  Patient has met 8 of 8 long term goals due to improved activity tolerance, improved balance, postural control, ability to compensate for deficits, improved attention, improved awareness, and improved coordination. Pt has made excellent progress during LOS d/t improved balance, LUE FMC, activity tolerance, strength and endurance. Pt currently completes ADLs with Rw with overall modified independence. Pt requires supervision for shower transfers and supervision for bathing from shower seat. Pt completes toileting MOD I and ambulatory ADL transfers with Rw MOD I. Pt is modified independent with light IADLs such as simple meal prep with Rw and Rw bag. Pts daughter was not present for family but pt reports her daughter will be home 24/7 to provide assistance. Issued pt written education on fall prevention and energy conservation strategies to be shared with her family since they were not available for family ed. Patient to discharge at overall Modified Independent level.  Patient's care partner is independent to provide the necessary physical assistance at discharge.    Reasons goals not met: NA  Recommendation:  Patient will benefit from ongoing skilled OT services in outpatient setting to continue to advance functional skills in the area of BADL and Reduce care partner burden.  Equipment: NO DME  Reasons for discharge: treatment goals met and discharge from hospital  Patient/family agrees with progress made and goals achieved: Yes  OT Discharge Precautions/Restrictions  Precautions Precautions: Fall Precaution Comments: L hemipareisis, constant headache and neck pain, rotator cuff injury Restrictions Weight Bearing Restrictions: No   Pain Pain Assessment Pain Scale: 0-10 Pain Score: 0-No pain ADL ADL Grooming: Independent Where  Assessed-Grooming: Standing at sink Upper Body Bathing: Supervision/safety Where Assessed-Upper Body Bathing: Shower Lower Body Bathing: Supervision/safety Where Assessed-Lower Body Bathing: Shower Upper Body Dressing: Modified independent (Device) Where Assessed-Upper Body Dressing: Wheelchair Lower Body Dressing: Modified independent Where Assessed-Lower Body Dressing: Wheelchair Toileting: Modified independent Where Assessed-Toileting: Glass blower/designer: Modified Programmer, applications Method: Ambulating (RW) Science writer: Energy manager: Distant supervision Social research officer, government Method: Ambulating (RW) Youth worker: Grab bars ADL Comments: overall MOD I - supervision for ADLs with Rw Vision Baseline Vision/History: 1 Wears glasses Patient Visual Report: No change from baseline;Other (comment) (deficits have resolveed, did have L nystagmus, blurry vision but has resolved) Vision Assessment?: Yes Ocular Range of Motion: Within Functional Limits Alignment/Gaze Preference: Within Defined Limits Tracking/Visual Pursuits: Able to track stimulus in all quads without difficulty Saccades: Within functional limits Convergence: Within functional limits Visual Fields: No apparent deficits Perception  Perception: Within Functional Limits Praxis Praxis: Intact Cognition Cognition Overall Cognitive Status: Within Functional Limits for tasks assessed Arousal/Alertness: Awake/alert Attention: Focused;Sustained;Selective Focused Attention: Appears intact Sustained Attention: Appears intact Selective Attention: Appears intact Awareness: Appears intact Problem Solving: Appears intact Brief Interview for Mental Status (BIMS) Repetition of Three Words (First Attempt): 3 Temporal Orientation: Year: Correct Temporal Orientation: Month: Accurate within 5 days Temporal Orientation: Day: Correct Recall: "Sock": Yes, no cue  required Recall: "Blue": Yes, no cue required Recall: "Bed": Yes, no cue required BIMS Summary Score: 15 Sensation Sensation Light Touch: Appears Intact Hot/Cold: Appears Intact Proprioception: Appears Intact Stereognosis: Not tested Coordination Gross Motor Movements are Fluid and Coordinated: No Fine Motor Movements are Fluid and Coordinated: No 9 Hole Peg Test: RUE: 40 secs LUE: 47 secs Motor  Motor Motor: Other (comment);Hemiplegia (hemiparesis) Motor - Discharge Observations: improved LUE hemiparesis from  eval Mobility  Bed Mobility Bed Mobility: Supine to Sit;Sit to Supine;Rolling Right;Rolling Left Rolling Right: Independent Rolling Left: Independent Supine to Sit: Independent with assistive device Sit to Supine: Independent with assistive device Transfers Sit to Stand: Independent with assistive device Stand to Sit: Independent with assistive device  Trunk/Postural Assessment  Cervical Assessment Cervical Assessment: Within Functional Limits Thoracic Assessment Thoracic Assessment: Exceptions to [facility] (rounded shoulders) Lumbar Assessment Lumbar Assessment: Within Functional Limits Postural Control Postural Control: Within Functional Limits  Balance   Extremity/Trunk Assessment RUE Assessment RUE Assessment: Within Functional Limits LUE Assessment LUE Assessment: Exceptions to [facility] Passive Range of Motion (PROM) Comments: WFL Active Range of Motion (AROM) Comments: impaired d/t L torn rotator cuff but overall WFL General Strength Comments: grossly 4/5 mmt LUE Body System: Neuro Brunstrum levels for arm and hand: Arm;Hand Brunstrum level for arm: Stage V Relative Independence from Synergy Brunstrum level for hand: Stage VI Isolated joint movements   Precious Haws 12/30/2021, 11:00 AM

## 2021-12-30 NOTE — Progress Notes (Signed)
Physical Therapy Discharge Summary  Patient Details  Name: Christina Orozco MRN: [id number] Date of Birth: 06-Jul-1970  Today's Date: 12/30/2021 PT Individual Time:  -       Patient has met 7 of 8 long term goals due to improved activity tolerance, improved balance, improved postural control, increased strength, improved attention, improved awareness, and improved coordination.  Patient to discharge at an ambulatory level Modified Independent.   Patient's care partner is independent to provide the necessary physical assistance at discharge.  Reasons goals not met: Pt unable to complete stair training due to vision deficits.   Recommendation:  Patient will benefit from ongoing skilled PT services in {setting:3041680} to continue to advance safe functional mobility, address ongoing impairments in ***, and minimize fall risk.  Equipment: Conservation officer, nature  Reasons for discharge: treatment goals met and discharge from hospital  Patient/family agrees with progress made and goals achieved: Yes  PT Discharge Precautions/Restrictions Precautions Precautions: Fall Precaution Comments: L hemipareisis, constant headache and neck pain, rotator cuff injury Restrictions Weight Bearing Restrictions: No Vital Signs  Pain Pain Assessment Pain Scale: 0-10 Pain Score: 0-No pain Pain Type: Acute pain Pain Location: Head Pain Orientation: Posterior Pain Descriptors / Indicators: Aching Pain Frequency: Constant Pain Onset: On-going Patients Stated Pain Goal: 0 Pain Intervention(s): Medication (See eMAR) Multiple Pain Sites: No Pain Interference Pain Interference Pain Effect on Sleep: 2. Occasionally Pain Interference with Therapy Activities: 1. Rarely or not at all Pain Interference with Day-to-Day Activities: 1. Rarely or not at all Vision/Perception  Vision - History Ability to See in Adequate Light: 1 Impaired Vision - Assessment Ocular Range of Motion: Within Functional  Limits Alignment/Gaze Preference: Within Defined Limits Tracking/Visual Pursuits: Able to track stimulus in all quads without difficulty Saccades: Within functional limits Convergence: Within functional limits Perception Perception: Within Functional Limits Praxis Praxis: Intact  Cognition Overall Cognitive Status: Within Functional Limits for tasks assessed Arousal/Alertness: Awake/alert Orientation Level: Oriented X4 Attention: Focused;Sustained;Selective Focused Attention: Appears intact Sustained Attention: Appears intact Selective Attention: Appears intact Awareness: Appears intact Problem Solving: Appears intact Sensation Sensation Light Touch: Appears Intact Hot/Cold: Appears Intact Proprioception: Appears Intact Stereognosis: Not tested Coordination Gross Motor Movements are Fluid and Coordinated: No Fine Motor Movements are Fluid and Coordinated: No 9 Hole Peg Test: RUE: 40 secs LUE: 47 secs Motor  Motor Motor: Other (comment);Hemiplegia (hemiparesis) Motor - Discharge Observations: improved LUE hemiparesis from eval  Mobility Bed Mobility Bed Mobility: Supine to Sit;Sit to Supine;Rolling Right;Rolling Left Rolling Right: Independent Rolling Left: Independent Supine to Sit: Independent with assistive device Sit to Supine: Independent with assistive device Transfers Transfers: Sit to Stand;Stand to Sit;Stand Pivot Transfers Sit to Stand: Independent with assistive device Stand to Sit: Independent with assistive device Stand Pivot Transfers: Independent with assistive device Transfer (Assistive device): Rolling walker Locomotion  Gait Ambulation: Yes Gait Assistance: Independent with assistive device Gait Distance (Feet): 300 Feet Assistive device: Rolling walker Gait Gait: Yes Gait Pattern: Impaired Gait Pattern: Step-through pattern;Decreased step length - right;Decreased step length - left;Decreased hip/knee flexion - left Gait velocity:  Decreased Stairs / Additional Locomotion Stairs: No Pick up small object from the floor assist level: Independent with assistive device Pick up small object from the floor assistive device: picked up item while holding onto sink Wheelchair Mobility Wheelchair Mobility: No  Trunk/Postural Assessment  Cervical Assessment Cervical Assessment: Within Functional Limits Thoracic Assessment Thoracic Assessment: Exceptions to [facility] (rounded shoulders) Lumbar Assessment Lumbar Assessment: Within Functional Limits Postural Control Postural Control: Within Functional Limits  Balance Balance Balance Assessed: Yes Static Sitting Balance Static Sitting - Balance Support: Feet supported;No upper extremity supported Static Sitting - Level of Assistance: 6: Modified independent (Device/Increase time) Dynamic Sitting Balance Dynamic Sitting - Balance Support: During functional activity;Feet supported Dynamic Sitting - Level of Assistance: 6: Modified independent (Device/Increase time) Static Standing Balance Static Standing - Balance Support: During functional activity;Bilateral upper extremity supported Static Standing - Level of Assistance: 6: Modified independent (Device/Increase time) Dynamic Standing Balance Dynamic Standing - Balance Support: During functional activity;Bilateral upper extremity supported Dynamic Standing - Level of Assistance: 6: Modified independent (Device/Increase time) Extremity Assessment  RUE Assessment RUE Assessment: Within Functional Limits LUE Assessment LUE Assessment: Exceptions to [facility] Passive Range of Motion (PROM) Comments: WFL Active Range of Motion (AROM) Comments: impaired d/t L torn rotator cuff but overall WFL General Strength Comments: grossly 4/5 mmt LUE Body System: Neuro Brunstrum levels for arm and hand: Arm;Hand Brunstrum level for arm: Stage V Relative Independence from Synergy Brunstrum level for hand: Stage VI Isolated joint movements RLE  Assessment RLE Assessment: Within Functional Limits LLE Assessment LLE Assessment: Exceptions to [facility] General Strength Comments: decreased per gross assessment    Rosita DeChalus 12/30/2021, 11:02 AM

## 2021-12-30 NOTE — Progress Notes (Signed)
Patient ID: Christina Orozco, female   DOB: 1971-03-03, 51 y.o.   MRN: [id number]  This SW covering for primary SW, Erlene Quan.   Per medical team, pt is ready for discharge.   SW discussed with pt if she would be amenable to d.c today. She is agreeable. States she will call her dtr around 12pm to discuss picking her up today. Pt aware waiting on rolling walker to be delivered to room. SW placed urgent request with [employer] on item being delivered today.   [name], MSW, [place] Office: [phone] Cell: [phone] Fax: [phone]

## 2021-12-30 NOTE — Progress Notes (Signed)
Inpatient Rehabilitation Discharge Medication Review by a Pharmacist  A complete drug regimen review was completed for this patient to identify any potential clinically significant medication issues.  High Risk Drug Classes Is patient taking? Indication by Medication  Antipsychotic No   Anticoagulant No   Antibiotic No   Opioid No   Antiplatelet Yes Aspirin, brilinta- CVA ppx  Hypoglycemics/insulin Yes Semaglutide- T2DM  Vasoactive Medication Yes Lasix, Apresoline, Norvasc, coreg- HTN  Chemotherapy No   Other Yes Robaxin- muscle spasms Protonix- GERD Lipitor- HLD Cymbalta- MDD Trazodone- sleep Singulair- asthma     Type of Medication Issue Identified Description of Issue Recommendation(s)  Drug Interaction(s) (clinically significant)     Duplicate Therapy     Allergy     No Medication Administration End Date     Incorrect Dose     Additional Drug Therapy Needed     Significant med changes from prior encounter (inform family/care partners about these prior to discharge).    Other       Clinically significant medication issues were identified that warrant physician communication and completion of prescribed/recommended actions by midnight of the next day:  No  Time spent performing this drug regimen review (minutes):  30   Sasha Rueth BS, PharmD, BCPS Clinical Pharmacist 12/30/2021 11:36 AM  Contact: [phone] after 3 PM  "Be curious, not judgmental..." -Jamal Maes

## 2021-12-30 NOTE — Progress Notes (Signed)
Physical Therapy Session Note  Patient Details  Name: Christina Orozco MRN: [id number] Date of Birth: [birth year]  Today's Date: 12/30/2021 PT Individual Time: 49 - 900 PT Individual Time Calculation (min): 55 min    Short Term Goals: Week 1:  PT Short Term Goal 1 (Week 1): STG = LTG d/t ELOS  Skilled Therapeutic Interventions/Progress Updates: Pt presented in bed agreeable to therapy. Pt denies pain at start and throughout session. Pt performed bed mobility mod I and requesting RW this date. Pt ambulating to bathroom mod I with RW and performed toilet transfers mod I. Pt with continent urinary void and able to complete all toileting tasks distant supervision/mod I. Pt ambulated to sink to perform oral hygiene in standing at mod I level. Pt returned to EOB and donned socks/shoes. Pt agreeable to be transported outside and perform ambulation in community setting in preparation for d/c. Pt transported to [facility] entrance and participated in ambulation bouts [id number] with RW and distant supervision. Pt ambulating with mild uneven step length and decrease in L foot clearance. Pt did demonstrate good safety with RW and was able to manage up/down grades and uneven surfaces with good safety awareness. Pt did require increased rest breaks due to increased fatigue this session. [facility] managing thresholds and door mats with good safety. Pt transported back to room at end of session and requested to remain in w/c. Pt left in w/c at end of session with call bell within reach and current needs met.      Therapy Documentation Precautions:  Precautions Precautions: Fall Precaution Comments: L hemipareisis, constant headache and neck pain, rotator cuff injury Restrictions Weight Bearing Restrictions: No General:   Vital Signs: Therapy Vitals Temp: 98.5 F (36.9 C) Temp Source: Oral Pulse Rate: 77 Resp: 14 BP: (!) 145/80 Patient Position (if appropriate): Lying Oxygen  Therapy SpO2: 100 % O2 Device: Room Air Pain:   Mobility:   Locomotion :    Trunk/Postural Assessment :    Balance:   Exercises:   Other Treatments:      Therapy/Group: Individual Therapy  Sabrina Keough 12/30/2021, 8:01 AM

## 2021-12-30 NOTE — Progress Notes (Signed)
Patient discharged home with all personal belongings. Discharge summary reviewed by PA. Patient declines further questions or concerns.

## 2021-12-30 NOTE — Progress Notes (Signed)
Inpatient Rehabilitation Care Coordinator Discharge Note   Patient Details  Name: Christina Orozco MRN: [id number] Date of Birth: [birth year]   Discharge location: D/c to home  Length of Stay: 6 days  Discharge activity level: Supervision with RW in community/distance; Mod I household distances  Home/community participation: Limited  Patient response [id number] Literacy - How often do you need to have someone help you when you read instructions, pamphlets, or other written material from your doctor or pharmacy?: Never  Patient response [id number] Isolation - How often do you feel lonely or isolated from those around you?: Never  Services provided included: MD, RD, PT, OT, RN, CM, Neuropsych, SW, Pharmacy, TR, SLP  Financial Services:  Charity fundraiser Utilized: Private Insurance [place] Medicaid [employer]  Choices offered to/list presented to: yes  Follow-up services arranged:  Outpatient, DME    Outpatient Servicies: [facility] for PT/OT DME : [employer] [facility] for RW    Patient response to transportation need: Is the patient able to respond to transportation needs?: Yes In the past 12 months, has lack of transportation kept you from medical appointments or from getting medications?: No In the past 12 months, has lack of transportation kept you from meetings, work, or from getting things needed for daily living?: No   Comments (or additional information):  Patient/Family verbalized understanding of follow-up arrangements:  Yes  Individual responsible for coordination of the follow-up plan: contact pt  Confirmed correct DME delivered: Rana Snare 12/30/2021    Rana Snare

## 2021-12-30 NOTE — Progress Notes (Signed)
PROGRESS NOTE   Subjective/Complaints: No cough, feels well  Covid neg x 2  Review of Systems  No fever, chills, HA, Abd pain.  Objective:   No results found. No results for input(s): "WBC", "HGB", "HCT", "PLT" in the last 72 hours.  No results for input(s): "NA", "K", "CL", "CO2", "GLUCOSE", "BUN", "CREATININE", "CALCIUM" in the last 72 hours.   Intake/Output Summary (Last 24 hours) at 12/30/2021 0745 Last data filed at 12/29/2021 2000 Gross per 24 hour  Intake 360 ml  Output --  Net 360 ml         Physical Exam: Vital Signs Blood pressure (!) 145/80, pulse 77, temperature 98.5 F (36.9 C), temperature source Oral, resp. rate 14, height 5\' 2"  (1.575 m), weight 61.5 kg, last menstrual period 05/22/2018, SpO2 100 %.  Physical Exam: Height 5\' 2"  (1.575 m), weight 61.5 kg, last menstrual period 05/22/2018. Gen: no distress, normal appearing, sitting in bed  General: No acute distress Mood and affect are appropriate Heart: Regular rate and rhythm no rubs murmurs or extra sounds Lungs: Clear to auscultation, breathing unlabored, no rales or wheezes Abdomen: Positive bowel sounds, soft nontender to palpation, nondistended Extremities: No clubbing, cyanosis, or edema Skin: No evidence of breakdown, no evidence of rash Neurologic: Cranial nerves II through XII intact, motor strength is 5/5 in right and 4+left deltoid, bicep, tricep, grip, hip flexor, knee extensors, ankle dorsiflexor and plantar flexor        Assessment/Plan: 1. Functional deficits which require 3+ hours per day of interdisciplinary therapy in a comprehensive inpatient rehab setting. Physiatrist is providing close team supervision and 24 hour management of active medical problems listed below. Physiatrist and rehab team continue to assess barriers to discharge/monitor patient progress toward functional and medical goals  Care Tool:  Bathing     Body parts bathed by patient: Right arm, Right lower leg, Left lower leg, Face, Left arm, Chest, Abdomen, Front perineal area, Buttocks, Right upper leg, Left upper leg         Bathing assist Assist Level: Supervision/Verbal cueing     Upper Body Dressing/Undressing Upper body dressing   What is the patient wearing?: Pull over shirt    Upper body assist Assist Level: Independent with assistive device    Lower Body Dressing/Undressing Lower body dressing      What is the patient wearing?: Underwear/pull up, Pants     Lower body assist Assist for lower body dressing: Supervision/Verbal cueing     Toileting Toileting    Toileting assist Assist for toileting: Supervision/Verbal cueing     Transfers Chair/bed transfer  Transfers assist     Chair/bed transfer assist level: Contact Guard/Touching assist     Locomotion Ambulation   Ambulation assist      Assist level: Contact Guard/Touching assist Assistive device: No Device Max distance: 187ft   Walk 10 feet activity   Assist     Assist level: Supervision/Verbal cueing Assistive device: Walker-rolling   Walk 50 feet activity   Assist    Assist level: Supervision/Verbal cueing Assistive device: Walker-rolling    Walk 150 feet activity   Assist    Assist level: Supervision/Verbal cueing Assistive device: Walker-rolling  Walk 10 feet on uneven surface  activity   Assist     Assist level: Contact Guard/Touching assist Assistive device: Walker-rolling   Wheelchair     Assist Is the patient using a wheelchair?: No Type of Wheelchair: Manual Wheelchair activity did not occur: N/A         Wheelchair 50 feet with 2 turns activity    Assist    Wheelchair 50 feet with 2 turns activity did not occur: N/A       Wheelchair 150 feet activity     Assist  Wheelchair 150 feet activity did not occur: N/A       Blood pressure (!) 145/80, pulse 77, temperature 98.5 F  (36.9 C), temperature source Oral, resp. rate 14, height 5\' 2"  (1.575 m), weight 61.5 kg, last menstrual period 05/22/2018, SpO2 100 %.  Medical Problem List and Plan: 1. 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  Antithrombotics: -DVT/anticoagulation:  TED hose             -antiplatelet therapy: aspirin 81 mg daily, Brilinta 60 mg daily 3. Pain Management: Tylenol as needed, sportscreme and heat for neck pain - improved 7/18 4. Depression:             Continue Cymbalta DR 60 mg q HS             -antipsychotic agents: n/a             -continue trazodone prn sleep 5. Neuropsych/cognition: This patient is capable of making decisions on her own behalf. 6. Skin/Wound Care: Routine skin care checks 7. Fluids/Electrolytes/Nutrition: Routine Is and Os and follow-up chemistries 8: Hypertension: monitor and consider home hydralazine 50 mg q AM, HS -continue amlodipine 5 mg daily             -continue carvedilol 12.5 mg BID             -continue Lasix 40 mg daily Vitals:   12/29/21 1937 12/30/21 0449  BP: (!) 144/75 (!) 145/80  Pulse: 79 77  Resp: 18 14  Temp: 98.6 F (37 C) 98.5 F (36.9 C)  SpO2: 100% 100%   Controlled 7/21 9: Hyperlipidemia: continue Lipitor 10: Seasonal allergies: continue fulticisone and  11: CKD stage IV: baseline Scr appears to be about 2.6-3.0; currently 3.72             -follow-up BMP and trend     Latest Ref Rng & Units 12/26/2021    5:47 AM 12/24/2021    5:14 AM 12/23/2021    3:14 AM  BMP  Glucose 70 - 99 mg/dL 95  97  101   BUN 6 - 20 mg/dL 36  47  44   Creatinine 0.44 - 1.00 mg/dL 3.11  3.33  3.72   Sodium 135 - 145 mmol/L 141  141  141   Potassium 3.5 - 5.1 mmol/L 3.9  3.9  3.3   Chloride 98 - 111 mmol/L 110  109  107   CO2 22 - 32 mmol/L 23  26  27    Calcium 8.9 - 10.3 mg/dL 9.4   9.4  9.1    Close to baseline 12: DM-2: continue  SSSI and CBGs (Hgb A1c = 6.3) 13: Seizure disorder: no new seizure activity; off Keppra. Will follow-up with [facility] 14: GERD: continue Protonix 15: Migraine headache history 16: Anemia, chronic disease: follow-up CBC (trending downward over last 24 months)    Latest Ref Rng & Units 12/26/2021    5:47 AM 12/24/2021    5:14 AM 12/23/2021    3:14 AM  CBC  WBC 4.0 - 10.5 K/uL 6.0  5.3  5.1   Hemoglobin 12.0 - 15.0 g/dL 9.4  8.9  8.5   Hematocrit 36.0 - 46.0 % 28.8  27.2  25.6   Platelets 150 - 400 K/uL 210  197  182     17: Hypokalemia:  (Lasix stopped 7/14)  follow-up BMP    Resolved  18. Constipation  -Miralax PRN  -give sorbitol 60mg  7/16   19.  Cough yesterday resolved Covid -, cont contact and respiratory precaution per ID  LOS: 7 days A FACE TO FACE EVALUATION WAS PERFORMED  Charlett Blake 12/30/2021, 7:45 AM

## 2021-12-30 NOTE — Progress Notes (Signed)
Occupational Therapy Session Note  Patient Details  Name: Christina Orozco MRN: [id number] Date of Birth: 11/13/1970  Today's Date: 12/30/2021 OT Individual Time: 1497-0263 OT Individual Time Calculation (min): 67 min    Short Term Goals: Week 1:  OT Short Term Goal 1 (Week 1): Pt will complete grooming at the sink with close supervision LRAD PRN. OT Short Term Goal 1 - Progress (Week 1): Met OT Short Term Goal 2 (Week 1): Pt will complete UB and LB dressing with close supervision including item retrieval LRAD PRN. OT Short Term Goal 2 - Progress (Week 1): Met OT Short Term Goal 3 (Week 1): Pt will complete toileting and toilet transfer with close supervision for safety LRAD PRN. OT Short Term Goal 3 - Progress (Week 1): Met OT Short Term Goal 4 (Week 1): Pt will complete light meal prep task with close supervision for safety with LRAD PRN. OT Short Term Goal 4 - Progress (Week 1): Progressing toward goal  Skilled Therapeutic Interventions/Progress Updates:  Pt greeted seated in w/c reporting she will be DC'ed today, therefore completed necessary DC requirements as indicated below:  Issued pt written HEP with theraputty to work on intrinsic muscle strength in L hand as well as light BUE theraband HEP to work on increasing overall UB endurance and strength.  Pt able to return demo all therex. Pt reports ADL needs have already been met, issued pt handouts on fall prevention and energy conservation strategies with education provided. Reamainder of session focus on IADL task of light housekeeping in room in  order to pack up pts room for DC. Pt completed IADLs from sitting in w/c and from Rw with modified independence.  Pt left seated in w/c all needs within reach.                   Therapy Documentation Precautions:  Precautions Precautions: Fall Precaution Comments: L hemipareisis, constant headache and neck pain, rotator cuff injury Restrictions Weight Bearing Restrictions:  No   Pain: Pain Assessment Pain Scale: 0-10 Pain Score: 0-No pain Pain Type: Acute pain Pain Location: Head Pain Orientation: Posterior Pain Descriptors / Indicators: Aching Pain Frequency: Constant Pain Onset: On-going Patients Stated Pain Goal: 0 Pain Intervention(s): Medication (See eMAR) Multiple Pain Sites: No ADL: ADL Grooming: Independent Where Assessed-Grooming: Standing at sink Upper Body Bathing: Supervision/safety Where Assessed-Upper Body Bathing: Shower Lower Body Bathing: Supervision/safety Where Assessed-Lower Body Bathing: Shower Upper Body Dressing: Modified independent (Device) Where Assessed-Upper Body Dressing: Wheelchair Lower Body Dressing: Modified independent Where Assessed-Lower Body Dressing: Wheelchair Toileting: Modified independent Where Assessed-Toileting: Glass blower/designer: Modified Programmer, applications Method: Ambulating (RW) Science writer: Energy manager: Distant supervision Social research officer, government Method: Ambulating (RW) Youth worker: Grab bars ADL Comments: overall MOD I - supervision for ADLs with Rw Vision Baseline Vision/History: 1 Wears glasses Patient Visual Report: No change from baseline;Other (comment) (deficits have resolveed, did have L nystagmus, blurry vision but has resolved) Vision Assessment?: Yes Ocular Range of Motion: Within Functional Limits Alignment/Gaze Preference: Within Defined Limits Tracking/Visual Pursuits: Able to track stimulus in all quads without difficulty Saccades: Within functional limits Convergence: Within functional limits Visual Fields: No apparent deficits Perception  Perception: Within Functional Limits Praxis Praxis: Intact Balance Balance Balance Assessed: Yes Static Sitting Balance Static Sitting - Balance Support: Feet supported;No upper extremity supported Static Sitting - Level of Assistance: 6: Modified independent  (Device/Increase time) Dynamic Sitting Balance Dynamic Sitting - Balance Support: During functional activity;Feet supported Dynamic  Sitting - Level of Assistance: 6: Modified independent (Device/Increase time) Static Standing Balance Static Standing - Balance Support: During functional activity;Bilateral upper extremity supported Static Standing - Level of Assistance: 6: Modified independent (Device/Increase time) Dynamic Standing Balance Dynamic Standing - Balance Support: During functional activity;Bilateral upper extremity supported Dynamic Standing - Level of Assistance: 6: Modified independent (Device/Increase time)    Therapy/Group: Individual Therapy  Corinne Ports [facility] 12/30/2021, 12:23 PM

## 2021-12-31 NOTE — Plan of Care (Signed)
  Problem: RH Stairs Goal: LTG Patient will ambulate up and down stairs w/assist (PT) Description: LTG: Patient will ambulate up and down # of stairs with assistance (PT) Outcome: Not Met (add Reason) Flowsheets Taken 12/31/2021 0352 LTG: Pt will ambulate up/down stairs assist needed:: Contact Guard/Touching assist Taken 12/24/2021 1753 LTG: Pt will  ambulate up and down number of stairs: 1 curb step using RW Note: Pt discharging home earlier than expected and demos difficulty d/t decreased vision.

## 2022-01-11 ENCOUNTER — Encounter: Payer: Medicaid Other | Admitting: Registered Nurse

## 2022-01-12 NOTE — Therapy (Addendum)
OUTPATIENT OCCUPATIONAL THERAPY NEURO EVALUATION  Patient Name: Christina Orozco MRN: [id number] DOB:11-Apr-1971, 51 y.o., female Today's Date: 01/13/2022  PCP: Dr. Rayburn Go REFERRING PROVIDER: Dr. Letta Pate   OT End of Session - 01/13/22 1227     Visit Number 1    Number of Visits 12    Date for OT Re-Evaluation 04/07/22    Authorization Type Medicare    Authorization Time Period POC written for 12 weeks, anticipate d/c after 4-6 weeks    Authorization - Visit Number 1    Progress Note Due on Visit 10    OT Start Time 0850    OT Stop Time 0927    OT Time Calculation (min) 37 min    Behavior During Therapy [facility] for tasks assessed/performed;Flat affect             Past Medical History:  Diagnosis Date   Anemia    severe   Anxiety    Asthma    CKD (chronic kidney disease)    stage IV   DDD (degenerative disc disease), cervical    Depression    Diabetes mellitus ([place])    Dislocated shoulder    right   GERD (gastroesophageal reflux disease)    Headache    migraines (about once a month)   History of kidney stones    Hypertension    Neuropathy    Peripheral vascular disease ([place])    Pneumonia    Stroke [facility])    Past Surgical History:  Procedure Laterality Date   ADENOIDECTOMY     APPENDECTOMY     CERVICAL SPINE SURGERY  06/2012   C5-C6   EYE SURGERY     left eye surgery    FOOT SURGERY     KENALOG INJECTION Left 03/10/2021   Procedure: Intravitreal Avastin Injection;  Surgeon: Sherlynn Stalls, MD;  Location: [place];  Service: Ophthalmology;  Laterality: Left;   LOOP RECORDER INSERTION N/A 08/27/2017   Procedure: LOOP RECORDER INSERTION;  Surgeon: Sanda Klein, MD;  Location: [place] [facility];  Service: Cardiovascular;  Laterality: N/A;   MEMBRANE PEEL Left 03/10/2021   Procedure: MEMBRANE PEEL;  Surgeon: Sherlynn Stalls, MD;  Location: [place];  Service: Ophthalmology;  Laterality: Left;   PARS PLANA VITRECTOMY Left 03/10/2021   Procedure: TWENTY-FIVE GAUGE  PARS PLANA VITRECTOMY;  Surgeon: Sherlynn Stalls, MD;  Location: [place];  Service: Ophthalmology;  Laterality: Left;   PHOTOCOAGULATION WITH LASER Left 03/10/2021   Procedure: PHOTOCOAGULATION WITH LASER;  Surgeon: Sherlynn Stalls, MD;  Location: [place];  Service: Ophthalmology;  Laterality: Left;   SHOULDER SURGERY     TEE WITHOUT CARDIOVERSION N/A 08/27/2017   Procedure: TRANSESOPHAGEAL ECHOCARDIOGRAM (TEE);  Surgeon: Sanda Klein, MD;  Location: [facility] ENDOSCOPY;  Service: Cardiovascular;  Laterality: N/A;   TONSILLECTOMY     Patient Active Problem List   Diagnosis Date Noted   AKI (acute kidney injury) ([place]) 50/02/3817   Acute embolic stroke ([place]) 29/93/7169   Hypernatremia 12/20/2021   Chronic kidney disease (CKD), stage IV (severe) ([place]) 12/19/2021   Left-sided weakness and visual deficits  12/19/2021   Vision abnormalities 12/19/2021   Stroke ([place]) 11/10/2021   Depression 07/05/2020   CVA (cerebral vascular accident) ([place]) 06/21/2020   CVA (cerebrovascular accident) ([place]) 06/19/2020   Generalized anxiety disorder 03/01/2019   Abnormality of gait 10/14/2018   Dizziness and giddiness 09/13/2018   Debility 06/26/2018   Chronic nonintractable headache 67/89/3810   Diastolic dysfunction    Congestion of nasal sinus    Pneumonia  of left lower lobe due to infectious organism    SOB (shortness of breath)    Candidiasis    Anemia    Hypoalbuminemia due to protein-calorie malnutrition (HCC)    Hypertension    Diabetes mellitus ([place])    PRES (posterior reversible encephalopathy syndrome)    Hypokalemia    History of CVA with residual deficit    History of seizure 03/25/2018   Bilateral carpal tunnel syndrome 02/14/2018   Arthritis 10/31/2017   Asthma 10/31/2017   Constipation 10/31/2017   Kidney stone 10/31/2017   Anemia, chronic disease 08/09/2017   Hyperlipidemia 07/26/2017   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   Anxiety as acute reaction to exceptional stress 04/23/2017   Insomnia  10/08/2016   Neuropathy 06/07/2016   Gait instability    Lesion of pons    Slurred speech    Dizziness 04/30/2016   Hyperglycemia 04/30/2016   Chronic kidney disease (CKD), stage III (moderate) ([place]) 04/30/2016   Ataxia 04/30/2016   Chronic right shoulder pain 04/03/2016   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   Rhinitis, allergic 08/25/2015   Asthma, mild intermittent 07/14/2015   Gastroesophageal reflux disease without esophagitis 06/08/2015   Hypertensive cardiovascular disease 05/05/2015   Vitamin D deficiency 05/05/2015    ONSET DATE: 12/18/21  REFERRING DIAG:  I63.9 ([id number]) - Acute embolic stroke (HCC)  U13.2 (ICD-10-CM) - Left-sided weakness    THERAPY DIAG:  Muscle weakness (generalized)  Other lack of coordination  Other symptoms and signs involving the nervous system  Attention and concentration deficit  Stiffness of right shoulder, not elsewhere classified  Other abnormalities of gait and mobility  Chronic right shoulder pain  Rationale for Evaluation and Treatment Rehabilitation  SUBJECTIVE:   SUBJECTIVE STATEMENT: Pt reports she wants to get back to where she was prior to this most recent CVA. Pt reports this was her 9th CVA. Pt accompanied by: self  PERTINENT HISTORY:50 y.o. female with medical history significant of CKD stage IV, asthma, anxiety and depression, T2DM, HTN, GERD, PVD, hx of CVA on DAPT who presented to [facility] ED on 7/9 with complaints of left sided weakness and vision changes.MRI revealing acute ischemia, nonhemorrhagic infarct involving deep white matter of posterior R frontal centrum semi ovale. Prior CVA 6/1, hypertension, hyperlipidemia, diabetes, chronic kidney disease, prior strokes, history of seizures     D/c from rehab 12/29/21 Pt has an aide M-F to assist with dressing   PRECAUTIONS: Other: no driving  WEIGHT BEARING RESTRICTIONS No  PAIN:  Are you having pain? Yes: NPRS scale: 8/10 Pain location: L shoulder, hx  of RC tear Pain description: aching Aggravating factors: movement, alleviating unknown  FALLS: Has patient fallen in last 6 months? Yes. Number of falls 1  LIVING ENVIRONMENT: Lives with: lives with their family, pt's dtr goes back to college 01/26/22 Lives in: House/apartment Stairs: No Has following equipment at home: Gilford Rile - 2 wheeled and Shower bench  PLOF: Needs assistance with ADLs and Needs assistance with homemaking  PATIENT GOALS to get back to prior level of function. Pt reports she had assist with ADLs prior to most recent CVA  OBJECTIVE:   HAND DOMINANCE: Right  ADLs: Overall ADLs: increased time., aide assists Transfers/ambulation related to ADLs:mod I with walker Eating: mod I with eating, needs assist with cutting Grooming: mod I UB Dressing: mod I LB Dressing: min A Toileting: mod I walker Bathing: min A Tub Shower transfers: min A Equipment: Transfer tub bench   IADLs: Shopping: dtr assists Light housekeeping:  aide handles Meal Prep: pt performs cold meal prep, family does cooking currently, pt has not resumed cooking Community mobility: mod I with walker Medication management: mod I Financial management: mod I,    MOBILITY STATUS: Hx of falls and mod I with walker   ACTIVITY TOLERANCE: Activity tolerance: limited to approximately 30 mins prior to rest break  FUNCTIONAL OUTCOME MEASURES: Quick Dash: 64%  UPPER EXTREMITY ROM   RUE WFLS  Active ROM Right eval Left eval  Shoulder flexion  110  Shoulder abduction  110  Shoulder adduction    Shoulder extension    Shoulder internal rotation    Shoulder external rotation    Elbow flexion  WFL  Elbow extension  WFL  Wrist flexion  WFL  Wrist extension  WFL  Wrist ulnar deviation    Wrist radial deviation    Wrist pronation  WFL  Wrist supination  WFL  (Blank rows = not tested)   UPPER EXTREMITY MMT:   LUE no resistance provided due to pt reports RC tear  MMT Right eval Left eval   Shoulder flexion  NT due to rotator cuff injury  Shoulder abduction    Shoulder adduction    Shoulder extension    Shoulder internal rotation    Shoulder external rotation    Middle trapezius    Lower trapezius    Elbow flexion    Elbow extension    Wrist flexion    Wrist extension    Wrist ulnar deviation    Wrist radial deviation    Wrist pronation    Wrist supination    (Blank rows = not tested)  HAND FUNCTION: Grip strength: Right: 52.2 lbs; Left: 51.1 lbs  COORDINATION:  9 hole peg test: RUE 35 secs, LUE 42 secs  SENSATION: Pt reports impaired sensation in left hand      COGNITION: Overall cognitive status: history of short term memory deficits  VISION:  Baseline vision:  hx of visual deficits from DM Visual history: retinopathy per pt report  VISION ASSESSMENT: Ocular ROM: WFL Tracking/Visual pursuits: Able to track stimulus in all quads without difficulty, hx of difficulty tracking to left Pt reports hx of left visual field deficits Patient has difficulty with following activities due to following visual impairments: reading, due to decreased visual acuity since CVA        TODAY'S TREATMENT:  N/A eval only   PATIENT EDUCATION: Education details: anticipated goals/ role of OT Person educated: Patient Education method: Explanation Education comprehension: verbalized understanding   HOME EXERCISE PROGRAM: N/A    GOALS: Goals reviewed with patient? Yes   LONG TERM GOALS: Target date: 04/07/22  I with HEP Baseline:  Goal status: INITIAL  2. Mod I with basic cooking tasks demonstrating good safety awareness Baseline:  Goal status: INITIAL  3.  I with adapted strategies to maximize safety and I with ADLs/IADLS. Baseline:  Goal status: INITIAL    ASSESSMENT:  CLINICAL IMPRESSION: Patient is a 51 y.o. female who was seen today for occupational therapy evaluation for CVA. Pt medical history significant of CKD stage IV, asthma,  anxiety and depression, T2DM, HTN, GERD, PVD, hx of CVA on DAPT who presented to [facility] ED on 7/9 with complaints of left sided weakness and vision changes.MRI revealing acute ischemia, nonhemorrhagic infarct involving deep white matter of posterior R frontal centrum semi ovale. Prior CVA 6/1, hypertension, hyperlipidemia, diabetes, chronic kidney disease, prior strokes, history of seizures, rotator cuff tear RUE per pt. report PERFORMANCE DEFICITS in  functional skills including ADLs, IADLs, coordination, dexterity, sensation, ROM, strength, pain, flexibility, FMC, GMC, mobility, balance, endurance, decreased knowledge of precautions, decreased knowledge of use of DME, vision, and UE functional use, cognitive skills including attention and memory, and psychosocial skills including coping strategies, environmental adaptation, habits, interpersonal interactions, and routines and behaviors.   IMPAIRMENTS are limiting patient from ADLs, IADLs, play, leisure, and social participation.   COMORBIDITIES may have co-morbidities  that affects occupational performance. Patient will benefit from skilled OT to address above impairments and improve overall function.  MODIFICATION OR ASSISTANCE TO COMPLETE EVALUATION: No modification of tasks or assist necessary to complete an evaluation.  OT OCCUPATIONAL PROFILE AND HISTORY: Detailed assessment: Review of records and additional review of physical, cognitive, psychosocial history related to current functional performance.  CLINICAL DECISION MAKING: LOW - limited treatment options, no task modification necessary  REHAB POTENTIAL: Good  EVALUATION COMPLEXITY: Low    PLAN: OT FREQUENCY: 1x/week  OT DURATION: 12 weeks due to scheduling, anticipate d/c after 4-6 weeks  PLANNED INTERVENTIONS: self care/ADL training, therapeutic exercise, therapeutic activity, neuromuscular re-education, manual therapy, balance training, functional mobility training, splinting,  ultrasound, paraffin, fluidotherapy, moist heat, cryotherapy, patient/family education, cognitive remediation/compensation, visual/perceptual remediation/compensation, energy conservation, coping strategies training, and DME and/or AE instructions  RECOMMENDED OTHER SERVICES: PT  CONSULTED AND AGREED WITH PLAN OF CARE: Patient  PLAN FOR NEXT SESSION: coordination HEP, safety for cooking, IADLs.   Maisee Vollman, OT 01/13/2022, 12:31 PM

## 2022-01-13 ENCOUNTER — Ambulatory Visit: Payer: Medicare Other | Admitting: Occupational Therapy

## 2022-01-13 ENCOUNTER — Ambulatory Visit: Payer: Medicare Other | Attending: Physical Medicine and Rehabilitation

## 2022-01-13 ENCOUNTER — Encounter (HOSPITAL_COMMUNITY): Payer: Self-pay

## 2022-01-13 DIAGNOSIS — G8929 Other chronic pain: Secondary | ICD-10-CM

## 2022-01-13 DIAGNOSIS — I639 Cerebral infarction, unspecified: Secondary | ICD-10-CM | POA: Insufficient documentation

## 2022-01-13 DIAGNOSIS — R41840 Attention and concentration deficit: Secondary | ICD-10-CM | POA: Diagnosis present

## 2022-01-13 DIAGNOSIS — M25611 Stiffness of right shoulder, not elsewhere classified: Secondary | ICD-10-CM | POA: Diagnosis present

## 2022-01-13 DIAGNOSIS — M6281 Muscle weakness (generalized): Secondary | ICD-10-CM | POA: Insufficient documentation

## 2022-01-13 DIAGNOSIS — R2689 Other abnormalities of gait and mobility: Secondary | ICD-10-CM | POA: Insufficient documentation

## 2022-01-13 DIAGNOSIS — R531 Weakness: Secondary | ICD-10-CM | POA: Diagnosis not present

## 2022-01-13 DIAGNOSIS — R278 Other lack of coordination: Secondary | ICD-10-CM

## 2022-01-13 DIAGNOSIS — R29818 Other symptoms and signs involving the nervous system: Secondary | ICD-10-CM | POA: Insufficient documentation

## 2022-01-13 DIAGNOSIS — M25511 Pain in right shoulder: Secondary | ICD-10-CM | POA: Insufficient documentation

## 2022-01-13 NOTE — Therapy (Signed)
OUTPATIENT PHYSICAL THERAPY NEURO EVALUATION   Patient Name: Christina Orozco MRN: [id number] DOB:1971/04/25, 51 y.o., female Today's Date: 01/13/2022   PCP: Jenel Lucks, PA-C REFERRING PROVIDER: Reesa Chew, PA-C   PT End of Session - 01/13/22 0743     Visit Number 1    Number of Visits 7    Date for PT Re-Evaluation 02/24/22    Authorization Type MCD healthy blue    PT Start Time 0758    PT Stop Time 0845    PT Time Calculation (min) 47 min    Equipment Utilized During Treatment Gait belt    Activity Tolerance Patient tolerated treatment well    Behavior During Therapy WFL for tasks assessed/performed;Flat affect             Past Medical History:  Diagnosis Date   Anemia    severe   Anxiety    Asthma    CKD (chronic kidney disease)    stage IV   DDD (degenerative disc disease), cervical    Depression    Diabetes mellitus ([place])    Dislocated shoulder    right   GERD (gastroesophageal reflux disease)    Headache    migraines (about once a month)   History of kidney stones    Hypertension    Neuropathy    Peripheral vascular disease ([place])    Pneumonia    Stroke [facility])    Past Surgical History:  Procedure Laterality Date   ADENOIDECTOMY     APPENDECTOMY     CERVICAL SPINE SURGERY  06/2012   C5-C6   EYE SURGERY     left eye surgery    FOOT SURGERY     KENALOG INJECTION Left 03/10/2021   Procedure: Intravitreal Avastin Injection;  Surgeon: Sherlynn Stalls, MD;  Location: [place];  Service: Ophthalmology;  Laterality: Left;   LOOP RECORDER INSERTION N/A 08/27/2017   Procedure: LOOP RECORDER INSERTION;  Surgeon: Sanda Klein, MD;  Location: [place] [facility];  Service: Cardiovascular;  Laterality: N/A;   MEMBRANE PEEL Left 03/10/2021   Procedure: MEMBRANE PEEL;  Surgeon: Sherlynn Stalls, MD;  Location: [place];  Service: Ophthalmology;  Laterality: Left;   PARS PLANA VITRECTOMY Left 03/10/2021   Procedure: TWENTY-FIVE GAUGE PARS PLANA VITRECTOMY;   Surgeon: Sherlynn Stalls, MD;  Location: [place];  Service: Ophthalmology;  Laterality: Left;   PHOTOCOAGULATION WITH LASER Left 03/10/2021   Procedure: PHOTOCOAGULATION WITH LASER;  Surgeon: Sherlynn Stalls, MD;  Location: [place];  Service: Ophthalmology;  Laterality: Left;   SHOULDER SURGERY     TEE WITHOUT CARDIOVERSION N/A 08/27/2017   Procedure: TRANSESOPHAGEAL ECHOCARDIOGRAM (TEE);  Surgeon: Sanda Klein, MD;  Location: [facility];  Service: Cardiovascular;  Laterality: N/A;   TONSILLECTOMY     Patient Active Problem List   Diagnosis Date Noted   AKI (acute kidney injury) ([place]) 41/28/7867   Acute embolic stroke ([place]) 67/20/9470   Hypernatremia 12/20/2021   Chronic kidney disease (CKD), stage IV (severe) ([place]) 12/19/2021   Left-sided weakness and visual deficits  12/19/2021   Vision abnormalities 12/19/2021   Stroke ([place]) 11/10/2021   Depression 07/05/2020   CVA (cerebral vascular accident) ([place]) 06/21/2020   CVA (cerebrovascular accident) ([place]) 06/19/2020   Generalized anxiety disorder 03/01/2019   Abnormality of gait 10/14/2018   Dizziness and giddiness 09/13/2018   Debility 06/26/2018   Chronic nonintractable headache 96/28/3662   Diastolic dysfunction    Congestion of nasal sinus    Pneumonia of left lower lobe due to infectious organism  SOB (shortness of breath)    Candidiasis    Anemia    Hypoalbuminemia due to protein-calorie malnutrition ([place])    Hypertension    Diabetes mellitus ([place])    PRES (posterior reversible encephalopathy syndrome)    Hypokalemia    History of CVA with residual deficit    History of seizure 03/25/2018   Bilateral carpal tunnel syndrome 02/14/2018   Arthritis 10/31/2017   Asthma 10/31/2017   Constipation 10/31/2017   Kidney stone 10/31/2017   Anemia, chronic disease 08/09/2017   Hyperlipidemia 07/26/2017   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[place]) 07/25/2017   Anxiety as acute reaction to exceptional stress 04/23/2017   Insomnia 10/08/2016   Neuropathy  06/07/2016   Gait instability    Lesion of pons    Slurred speech    Dizziness 04/30/2016   Hyperglycemia 04/30/2016   Chronic kidney disease (CKD), stage III (moderate) ([place]) 04/30/2016   Ataxia 04/30/2016   Chronic right shoulder pain 04/03/2016   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   Rhinitis, allergic 08/25/2015   Asthma, mild intermittent 07/14/2015   Gastroesophageal reflux disease without esophagitis 06/08/2015   Hypertensive cardiovascular disease 05/05/2015   Vitamin D deficiency 05/05/2015    ONSET DATE: 12/18/21  REFERRING DIAG:  I63.9 ([id number]) - Acute embolic stroke (HCC)  Z36.6 (ICD-10-CM) - Left-sided weakness    THERAPY DIAG:  Other abnormalities of gait and mobility  Muscle weakness (generalized)  Other lack of coordination  Rationale for Evaluation and Treatment Rehabilitation  SUBJECTIVE:                                                                                                                                                                                              SUBJECTIVE STATEMENT: "I'm here." Patient reports doing okay. Reporting that she's had 9 previous strokes, but "this one's the worst" Pt accompanied by: self  PERTINENT HISTORY: multiple prior strokes   PAIN:  Are you having pain? Yes: NPRS scale: 8/10 Pain location: L shoulder, back of head and neck  Pain description: pounding, achy  Aggravating factors: movement  Relieving factors: none reported   VITALS:  BP: 135/87  PRECAUTIONS: Fall  WEIGHT BEARING RESTRICTIONS No  FALLS: Has patient fallen in last 6 months? Yes. Number of falls 1- patient reports she missed a chair when going to sit- denies hitting her head. Able to get up off the floor herself.   LIVING ENVIRONMENT: Lives with:  will be living alone after 8/17 with dtr coming home every other weekend; [facility] aide M-F for ~3 hours/day for the past year  Lives in: House/apartment Stairs: No Has following  equipment at  home: Single point cane, Walker - 2 wheeled, shower chair, and Grab bars  PLOF: Requires assistive device for independence and HH aide would help patient "if she needed it"   PATIENT GOALS "get off the walker"  OBJECTIVE:   DIAGNOSTIC FINDINGS:  7/10 brain MRI: 1. 1.9 cm acute ischemic nonhemorrhagic infarct involving the deep white matter of the posterior right frontal centrum semi ovale. 2. Underlying advanced chronic microvascular ischemic disease with multiple remote lacunar infarcts involving the bilateral basal ganglia, thalami, pons, and cerebellum. 3. Multiple chronic micro hemorrhages clustered about the brainstem and thalami, consistent with chronic poorly controlled hypertension.  COGNITION: Overall cognitive status: No family/caregiver present to determine baseline cognitive functioning   SENSATION: Patch of impaired light touch to mid anterior thigh  COORDINATION: WFL  B LE (figure 8, heel/shin)  EDEMA:  Intermittent swelling in B LE- wears compression socks   MUSCLE TONE: WFL   POSTURE: No Significant postural limitations    LOWER EXTREMITY MMT:    MMT Right Eval Left Eval  Hip flexion 5 4  Hip extension    Hip abduction 5 5  Hip adduction 5 5  Hip internal rotation    Hip external rotation    Knee flexion 5 4  Knee extension 5 4  Ankle dorsiflexion 5 5  Ankle plantarflexion 5 5  Ankle inversion    Ankle eversion    (Blank rows = not tested)  BED MOBILITY:  Denies difficulty   TRANSFERS: Assistive device utilized: Environmental consultant - 2 wheeled  Sit to stand: Modified independence Stand to sit: Modified independence Chair to chair: Modified independence   STAIRS:  -patient refused states "last time I did stairs I went blind the the Dr said that my arthritis traveled to my eye"  GAIT: Gait pattern: step through pattern, decreased arm swing- Right, decreased arm swing- Left, Right foot flat, and Left foot flat Distance walked:  clinic Assistive device utilized: None Level of assistance: CGA   FUNCTIONAL TESTs:   [facility] PT Assessment - 01/13/22 0001       Standardized Balance Assessment   Standardized Balance Assessment Berg Balance Test    10 Meter Walk .91m/s (fast speed)   .26m/s normal speed     Berg Balance Test   Sit to Stand Able to stand without using hands and stabilize independently    Standing Unsupported Able to stand safely 2 minutes    Sitting with Back Unsupported but Feet Supported on Floor or Stool Able to sit safely and securely 2 minutes    Stand to Sit Sits safely with minimal use of hands    Transfers Able to transfer safely, definite need of hands    Standing Unsupported with Eyes Closed Able to stand 10 seconds with supervision    Standing Unsupported with Feet Together Able to place feet together independently and stand for 1 minute with supervision    From Standing, Reach Forward with Outstretched Arm Can reach forward >12 cm safely (5")    From Standing Position, Pick up Object from Floor Able to pick up shoe, needs supervision    From Standing Position, Turn to Look Behind Over each Shoulder Looks behind from both sides and weight shifts well    Turn 360 Degrees Able to turn 360 degrees safely but slowly    Standing Unsupported, Alternately Place Feet on Step/Stool Able to complete 4 steps without aid or supervision    Standing Unsupported, One Foot in Front Able to take small step independently  and hold 30 seconds    Standing on One Leg Able to lift leg independently and hold 5-10 seconds    Total Score 44      Functional Gait  Assessment   Gait assessed  Yes    Gait Level Surface Walks 20 ft, slow speed, abnormal gait pattern, evidence for imbalance or deviates 10-15 in outside of the 12 in walkway width. Requires more than 7 sec to ambulate 20 ft.    Change in Gait Speed Makes only minor adjustments to walking speed, or accomplishes a change in speed with significant gait  deviations, deviates 10-15 in outside the 12 in walkway width, or changes speed but loses balance but is able to recover and continue walking.    Gait with Horizontal Head Turns Performs head turns with moderate changes in gait velocity, slows down, deviates 10-15 in outside 12 in walkway width but recovers, can continue to walk.    Gait with Vertical Head Turns Performs task with moderate change in gait velocity, slows down, deviates 10-15 in outside 12 in walkway width but recovers, can continue to walk.    Gait and Pivot Turn Cannot turn safely, requires assistance to turn and stop.    Step Over Obstacle Cannot perform without assistance.    Gait with Narrow Base of Support Ambulates less than 4 steps heel to toe or cannot perform without assistance.    Gait with Eyes Closed Walks 20 ft, slow speed, abnormal gait pattern, evidence for imbalance, deviates 10-15 in outside 12 in walkway width. Requires more than 9 sec to ambulate 20 ft.    Ambulating Backwards Walks 20 ft, slow speed, abnormal gait pattern, evidence for imbalance, deviates 10-15 in outside 12 in walkway width.    Steps Cannot do safely.    Total Score 6             TODAY'S TREATMENT:  N/A eval   PATIENT EDUCATION: Education details: PT POC, OM results Person educated: Patient Education method: Explanation Education comprehension: verbalized understanding   HOME EXERCISE PROGRAM: To be provided    GOALS: Goals reviewed with patient? Yes  SHORT TERM GOALS: Target date: 02/03/2022  Pt will be independent with initial HEP for improved ambulatory balance and functional strength  Baseline: to be provided  Goal status: INITIAL  2.  Pt will improve FGA to >/= 11/30 to demonstrate improved balance and reduced fall risk  Baseline: 6/30 Goal status: INITIAL  3.  Pt will improve gait speed to >/= .74m/s to demonstrate improved community ambulation  Baseline: .73m/s Goal status: INITIAL  4.  Patient will  improve BBS score to >/=52/56 Baseline: 44/56 Goal status: INITIAL    LONG TERM GOALS: Target date: 02/24/2022  Pt will be independent with final HEP for improved ambulatory balance and functional strength   Baseline: to be provided Goal status: INITIAL  2.  Pt will improve FGA to >/= 16/30 to demonstrate improved balance and reduced fall risk Baseline: 6/30 Goal status: INITIAL  3.  Pt will improve gait speed to >/= .79m/s to demonstrate improved community ambulation  Baseline: .89m/s Goal status: INITIAL    ASSESSMENT:  CLINICAL IMPRESSION: Patient is a 51 y.o. female who was seen today for physical therapy evaluation and treatment for residual functional strength impairments and gait disturbance related to most recent stroke. Patient demonstrates increased fall risk as noted by score of 6/30 on  Functional Gait Assessment.   <22/30 = predictive of falls, <20/30 = fall in 6  months, <18/30 = predictive of falls in PD MCID: 5 points stroke population, 4 points geriatric population (ANPTA Core Set of Outcome Measures for Adults with Neurologic Conditions, 2018). 10 Meter Walk Test: Patient instructed to walk 10 meters (32.8 ft) as quickly and as safely as possible at their normal speed x2 and at a fast speed x2. Time measured from 2 meter mark to 8 meter mark to accommodate ramp-up and ramp-down.  Normal speed: .35m/s Cut off scores: <0.4 m/s = household Ambulator, 0.4-0.8 m/s = limited community Ambulator, >0.8 m/s = community Ambulator, >1.2 m/s = crossing a street, <1.0 = increased fall risk MCID 0.05 m/s (small), 0.13 m/s (moderate), 0.06 m/s (significant)  (ANPTA Core Set of Outcome Measures for Adults with Neurologic Conditions, 2018). Patient demonstrated increased fall risk noted by score of 44/56 on the [facility] Scale.  <45/56 = fall risk, <42/56 = predictive of recurrent falls, <40/56 = 100% fall risk  >41 = independent, 21-40 = assistive device, 0-20 = wheelchair level   MDC 6.9 (4 pts 45-56, 5 pts 35-44, 7 pts 25-34) (ANPTA Core Set of Outcome Measures for Adults with Neurologic Conditions, 2018). Patient will benefit from skilled PT services in order to address the above mentioned deficits.  OBJECTIVE IMPAIRMENTS Abnormal gait, decreased activity tolerance, decreased balance, decreased endurance, decreased knowledge of condition, difficulty walking, decreased strength, and pain.   ACTIVITY LIMITATIONS carrying, lifting, bending, standing, squatting, stairs, transfers, locomotion level, and caring for others  PARTICIPATION LIMITATIONS: meal prep, cleaning, driving, shopping, and community activity  PERSONAL FACTORS Behavior pattern, Past/current experiences, Transportation, and 1-2 comorbidities: HTN, multiple prior CVAs  are also affecting patient's functional outcome.   REHAB POTENTIAL: Good  CLINICAL DECISION MAKING: Stable/uncomplicated  EVALUATION COMPLEXITY: Low  PLAN: PT FREQUENCY: 1x/week per patient request  PT DURATION: 6 weeks  PLANNED INTERVENTIONS: Therapeutic exercises, Therapeutic activity, Neuromuscular re-education, Balance training, Gait training, Patient/Family education, Self Care, Joint mobilization, Stair training, Vestibular training, Visual/preceptual remediation/compensation, Orthotic/Fit training, DME instructions, Aquatic Therapy, Cognitive remediation, Electrical stimulation, Cryotherapy, Moist heat, Taping, Manual therapy, and Re-evaluation  PLAN FOR NEXT SESSION: provide HEP, trial [place] Kelsye Loomer, PT Debbora Dus, PT, DPT, CBIS  01/13/2022, 8:55 AM

## 2022-01-17 ENCOUNTER — Encounter: Payer: Self-pay | Admitting: Registered Nurse

## 2022-01-17 ENCOUNTER — Encounter: Payer: Medicare Other | Attending: Registered Nurse | Admitting: Registered Nurse

## 2022-01-17 VITALS — BP 141/92 | HR 82 | Ht 62.0 in | Wt 142.4 lb

## 2022-01-17 DIAGNOSIS — I639 Cerebral infarction, unspecified: Secondary | ICD-10-CM | POA: Diagnosis present

## 2022-01-17 DIAGNOSIS — I10 Essential (primary) hypertension: Secondary | ICD-10-CM | POA: Diagnosis present

## 2022-01-17 DIAGNOSIS — R531 Weakness: Secondary | ICD-10-CM | POA: Diagnosis present

## 2022-01-17 DIAGNOSIS — F5101 Primary insomnia: Secondary | ICD-10-CM | POA: Insufficient documentation

## 2022-01-17 DIAGNOSIS — K5900 Constipation, unspecified: Secondary | ICD-10-CM | POA: Diagnosis present

## 2022-01-17 MED ORDER — SENNOSIDES-DOCUSATE SODIUM 8.6-50 MG PO TABS: 2.0000 | ORAL_TABLET | Freq: Two times a day (BID) | ORAL | 0 refills | Status: DC

## 2022-01-17 NOTE — Progress Notes (Signed)
Subjective:    Patient ID: [name], female    DOB: Mar 13, 1971, 51 y.o.   MRN: [id number]  HPI: Mariko Nowakowski is a 51 y.o. female  who is scheduled for HFU appointment, for F/U of her Acute Embolic Stroke, Left side weakness and visual deficits, Essential Hypertension, Primary Insomnia and Constipation. Ms. Adney presented to the Emergency Department on 12/18/2021 with complaints of left arm, left lower extremity weakness, patient left without being seen. [facility] with left sided weakness and visual deficits, she was transferred to [facility].   Dr Rogers Blocker H&P: 12/18/2021 Chief Complaint: left sided weakness and visual deficits    HPI: Mahrosh Donnell is a 51 y.o. female with medical history significant of CKD stage IV, asthma, anxiety and depression, T2DM, HTN, GERD, PVD, hx of CVA on DAPT who presented to [facility] ED on 7/9 with complaints of left sided weakness and vision changes. She states when she woke up on Saturday Ams she had weakness in her left arm. She had no tingling or numbness, but did have decreased grip strength. On Sunday AM she had left leg weakness and was dragging her leg. She also had vision changes and couldn't see. She feels like she is having depth perception problems and gets dizzy when she looks to the left. Everything was distorted with some double vision. She has a picture where her left eye is turned outwards. She knew something wasn't right so came to ED. She came to ER and was put in waiting room, but didn't want to stay after waiting 3 hours so left and went to [facility].  CT Head WO Contrast IMPRESSION: Chronic ischemic changes without acute abnormality.  MRI/MRA IMPRESSION: MRI HEAD:   1. 1.9 cm acute ischemic nonhemorrhagic infarct involving the deep white matter of the posterior right frontal centrum semi ovale. 2. Underlying advanced chronic microvascular ischemic disease with multiple remote lacunar infarcts involving  the bilateral basal ganglia, thalami, pons, and cerebellum. 3. Multiple chronic micro hemorrhages clustered about the brainstem and thalami, consistent with chronic poorly controlled hypertension.   MRA HEAD:   Normal intracranial MRA. No large vessel occlusion or hemodynamically significant stenosis.   MRA NECK:   Normal MRA of the neck.  Neurology Following.   Ms. Petrakis was admitted to inpatient rehabilitation on 12/23/2021 and discharged home on 12/2021, she receiving Outpatient Therapy at [facility]. She states she has pain in her neck, left shoulder, right lower extremity with burning sensation she reports. She rates her pain 8.     Pain Inventory Average Pain 8 Pain Right Now 8 My pain is burning  LOCATION OF PAIN  head, neck, shoulder, elbow, hand, back, thigh, knee  BOWEL Number of stools per week: 0   BLADDER Normal Frequent urination Yes     Mobility walk with assistance use a cane use a walker how many minutes can you walk? 3 ability to climb steps?  no do you drive?  no Do you have any goals in this area?  yes  Function disabled: date disabled 03/22/18 I need assistance with the following:  dressing, bathing, household duties, and shopping Do you have any goals in this area?  yes  Neuro/Psych bladder control problems bowel control problems weakness trouble walking spasms confusion depression anxiety  Prior Studies Any changes since last visit?  no  Physicians involved in your care Any changes since last visit?  no   Family History  Problem Relation Age of Onset  Vascular Disease Mother    CAD Mother    Heart failure Mother    Heart disease Other    Cancer Other        colon, stomach, pancreatic, lung   Diabetes Other    Breast cancer Sister 8   Seizures Maternal Uncle    Social History   Socioeconomic History   Marital status: Single    Spouse name: Not on file   Number of children: 3   Years of  education: 12+   Highest education level: Some college, no degree  Occupational History   Not on file  Tobacco Use   Smoking status: Never   Smokeless tobacco: Never  Vaping Use   Vaping Use: Never used  Substance and Sexual Activity   Alcohol use: No   Drug use: No   Sexual activity: Not Currently  Other Topics Concern   Not on file  Social History Narrative   Lives with daughter   Caffeine use: "once in a blue moon"   Right handed   Social Determinants of Health   Financial Resource Strain: Not on file  Food Insecurity: Not on file  Transportation Needs: Not on file  Physical Activity: Not on file  Stress: Not on file  Social Connections: Not on file   Past Surgical History:  Procedure Laterality Date   [place]  06/2012   C5-C6   EYE SURGERY     left eye surgery    FOOT SURGERY     KENALOG INJECTION Left 03/10/2021   Procedure: Intravitreal Avastin Injection;  Surgeon: Sherlynn Stalls, MD;  Location: [place];  Service: Ophthalmology;  Laterality: Left;   LOOP RECORDER INSERTION N/A 08/27/2017   Procedure: LOOP RECORDER INSERTION;  Surgeon: Sanda Klein, MD;  Location: [place] [facility];  Service: Cardiovascular;  Laterality: N/A;   MEMBRANE PEEL Left 03/10/2021   Procedure: MEMBRANE PEEL;  Surgeon: Sherlynn Stalls, MD;  Location: [place];  Service: Ophthalmology;  Laterality: Left;   PARS PLANA VITRECTOMY Left 03/10/2021   Procedure: TWENTY-FIVE GAUGE PARS PLANA VITRECTOMY;  Surgeon: Sherlynn Stalls, MD;  Location: [place];  Service: Ophthalmology;  Laterality: Left;   PHOTOCOAGULATION WITH LASER Left 03/10/2021   Procedure: PHOTOCOAGULATION WITH LASER;  Surgeon: Sherlynn Stalls, MD;  Location: [place];  Service: Ophthalmology;  Laterality: Left;   SHOULDER SURGERY     TEE WITHOUT CARDIOVERSION N/A 08/27/2017   Procedure: TRANSESOPHAGEAL ECHOCARDIOGRAM (TEE);  Surgeon: Sanda Klein, MD;  [facility];  Service:  Cardiovascular;  Laterality: N/A;   TONSILLECTOMY     Past Medical History:  Diagnosis Date   Anemia    severe   Anxiety    Asthma    CKD (chronic kidney disease)    stage IV   DDD (degenerative disc disease), cervical    Depression    Diabetes mellitus ([place])    Dislocated shoulder    right   GERD (gastroesophageal reflux disease)    Headache    migraines (about once a month)   History of kidney stones    Hypertension    Neuropathy    Peripheral vascular disease (HCC)    Pneumonia    Stroke (HCC)    BP (!) 141/92   Pulse 82   Ht 5\' 2"  (1.575 m)   Wt 142 lb 6.4 oz (64.6 kg)   LMP 05/22/2018 (Within Days)   SpO2 99%   BMI 26.05 kg/m  Opioid Risk Score:   Fall Risk Score:  `1  Depression screen [facility] 2/9     01/17/2022   11:17 AM 07/05/2020    1:27 PM 09/13/2018   11:41 AM 07/15/2018   11:04 AM 05/23/2018    9:07 AM 04/26/2018   10:58 AM 04/23/2018   10:20 AM  Depression screen PHQ 2/9  Decreased Interest 1 3 1  0 2 1 0  Down, Depressed, Hopeless 2 3 1 1 2 2 1   PHQ - 2 Score 3 6 2 1 4 3 1   Altered sleeping 1 1   2 2    Tired, decreased energy 2 1   2 2    Change in appetite 2 1   1 1    Feeling bad or failure about yourself  3 2   2 3    Trouble concentrating 2 0   0 0   Moving slowly or fidgety/restless 1 0   0 0   Suicidal thoughts 0 0   0 0   PHQ-9 Score 14 11   11 11    Difficult doing work/chores Somewhat difficult     Somewhat difficult      Review of Systems  Constitutional:  Positive for appetite change, chills and unexpected weight change.  HENT: Negative.    Eyes: Negative.   Respiratory:  Positive for apnea and shortness of breath.   Cardiovascular: Negative.   Gastrointestinal: Negative.   Endocrine: Negative.   Genitourinary:  Positive for frequency.  Musculoskeletal:  Positive for back pain, gait problem and neck pain.  Skin: Negative.   Allergic/Immunologic: Negative.   Neurological:  Positive for weakness and headaches.  Hematological:  Negative.   Psychiatric/Behavioral:  Positive for confusion, dysphoric mood and sleep disturbance. The patient is nervous/anxious.       Objective:   Physical Exam Vitals and nursing note reviewed.  Constitutional:      Appearance: Normal appearance.  Cardiovascular:     Rate and Rhythm: Normal rate and regular rhythm.     Pulses: Normal pulses.     Heart sounds: Normal heart sounds.  Pulmonary:     Effort: Pulmonary effort is normal.     Breath sounds: Normal breath sounds.  Musculoskeletal:     Cervical back: Normal range of motion and neck supple.     Comments: Normal Muscle Bulk and Muscle Testing Reveals:  Upper Extremities: Right: Full ROM and Muscle Strength 5/5 Left AC Joint Tenderness  Lumbar Paraspinal Tenderness: L-4-L-5 Lower Extremities: Full ROM and Muscle Strength 5/5 Arises from chair slowly using walker for support Narrow Based  Gait     Skin:    General: Skin is warm and dry.  Neurological:     General: No focal deficit present.     Mental Status: She is alert and oriented to person, place, and time.  Psychiatric:        Mood and Affect: Mood normal.        Behavior: Behavior normal.         Assessment & Plan:  Acute Embolic Stroke, Left side weakness and visual deficits: Neurology following : [facility]  Continue Outpatient Therapy at [facility]. Continue current medication regimen. Continue to monitor.  Essential Hypertension: Continue current medication regimen. PCP following. Continue to monitor.  3.  Primary Insomnia : Continue Trazodone: PCP Following. Continue to Monitor.  4. Constipation: Continue Senna: PCP following. Continue to monitor.   F/U with Dr Letta Pate in 4- 6 weeks

## 2022-01-20 ENCOUNTER — Ambulatory Visit: Payer: Medicare Other

## 2022-01-20 DIAGNOSIS — R2689 Other abnormalities of gait and mobility: Secondary | ICD-10-CM | POA: Diagnosis not present

## 2022-01-20 DIAGNOSIS — M6281 Muscle weakness (generalized): Secondary | ICD-10-CM

## 2022-01-20 DIAGNOSIS — R278 Other lack of coordination: Secondary | ICD-10-CM

## 2022-01-20 NOTE — Therapy (Signed)
OUTPATIENT PHYSICAL THERAPY TREATMENT NOTE   Patient Name: Christina Orozco MRN: [id number] DOB:[birth year], 51 y.o., female Today's Date: 01/20/2022  PCP: Jenel Lucks, PA-C REFERRING PROVIDER: Reesa Chew, PA-C  END OF SESSION:   PT End of Session - 01/20/22 0925     Visit Number 2    Number of Visits 7    Date for PT Re-Evaluation 02/24/22    Authorization Type MCD healthy blue    PT Start Time 0930    PT Stop Time 1012    PT Time Calculation (min) 42 min    Equipment Utilized During Treatment Gait belt    Activity Tolerance Patient tolerated treatment well    Behavior During Therapy WFL for tasks assessed/performed;Flat affect             Past Medical History:  Diagnosis Date   Anemia    severe   Anxiety    Asthma    CKD (chronic kidney disease)    stage IV   DDD (degenerative disc disease), cervical    Depression    Diabetes mellitus ([place])    Dislocated shoulder    right   GERD (gastroesophageal reflux disease)    Headache    migraines (about once a month)   History of kidney stones    Hypertension    Neuropathy    Peripheral vascular disease ([place])    Pneumonia    Stroke [facility])    Past Surgical History:  Procedure Laterality Date   ADENOIDECTOMY     APPENDECTOMY     CERVICAL SPINE SURGERY  06/2012   C5-C6   EYE SURGERY     left eye surgery    FOOT SURGERY     KENALOG INJECTION Left 03/10/2021   Procedure: Intravitreal Avastin Injection;  Surgeon: Sherlynn Stalls, MD;  Location: [place];  Service: Ophthalmology;  Laterality: Left;   LOOP RECORDER INSERTION N/A 08/27/2017   Procedure: LOOP RECORDER INSERTION;  Surgeon: Sanda Klein, MD;  Location: [place] [facility];  Service: Cardiovascular;  Laterality: N/A;   MEMBRANE PEEL Left 03/10/2021   Procedure: MEMBRANE PEEL;  Surgeon: Sherlynn Stalls, MD;  Location: [place];  Service: Ophthalmology;  Laterality: Left;   PARS PLANA VITRECTOMY Left 03/10/2021   Procedure: TWENTY-FIVE GAUGE PARS  PLANA VITRECTOMY;  Surgeon: Sherlynn Stalls, MD;  Location: [place];  Service: Ophthalmology;  Laterality: Left;   PHOTOCOAGULATION WITH LASER Left 03/10/2021   Procedure: PHOTOCOAGULATION WITH LASER;  Surgeon: Sherlynn Stalls, MD;  Location: [place];  Service: Ophthalmology;  Laterality: Left;   SHOULDER SURGERY     TEE WITHOUT CARDIOVERSION N/A 08/27/2017   Procedure: TRANSESOPHAGEAL ECHOCARDIOGRAM (TEE);  Surgeon: Sanda Klein, MD;  Location: [facility];  Service: Cardiovascular;  Laterality: N/A;   TONSILLECTOMY     Patient Active Problem List   Diagnosis Date Noted   AKI (acute kidney injury) ([place]) 63/84/6659   Acute embolic stroke ([place]) 93/57/0177   Hypernatremia 12/20/2021   Chronic kidney disease (CKD), stage IV (severe) ([place]) 12/19/2021   Left-sided weakness and visual deficits  12/19/2021   Vision abnormalities 12/19/2021   Stroke ([place]) 11/10/2021   Depression 07/05/2020   CVA (cerebral vascular accident) ([place]) 06/21/2020   CVA (cerebrovascular accident) ([place]) 06/19/2020   Generalized anxiety disorder 03/01/2019   Abnormality of gait 10/14/2018   Dizziness and giddiness 09/13/2018   Debility 06/26/2018   Chronic nonintractable headache 93/90/3009   Diastolic dysfunction    Congestion of nasal sinus    Pneumonia of left lower lobe due  to infectious organism    SOB (shortness of breath)    Candidiasis    Anemia    Hypoalbuminemia due to protein-calorie malnutrition (HCC)    Hypertension    Diabetes mellitus ([place])    PRES (posterior reversible encephalopathy syndrome)    Hypokalemia    History of CVA with residual deficit    History of seizure 03/25/2018   Bilateral carpal tunnel syndrome 02/14/2018   Arthritis 10/31/2017   Asthma 10/31/2017   Constipation 10/31/2017   Kidney stone 10/31/2017   Anemia, chronic disease 08/09/2017   Hyperlipidemia 07/26/2017   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[place]) 07/25/2017   Anxiety as acute reaction to exceptional stress 04/23/2017   Insomnia  10/08/2016   Neuropathy 06/07/2016   Gait instability    Lesion of pons    Slurred speech    Dizziness 04/30/2016   Hyperglycemia 04/30/2016   Chronic kidney disease (CKD), stage III (moderate) ([place]) 04/30/2016   Ataxia 04/30/2016   Chronic right shoulder pain 04/03/2016   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   Rhinitis, allergic 08/25/2015   Asthma, mild intermittent 07/14/2015   Gastroesophageal reflux disease without esophagitis 06/08/2015   Hypertensive cardiovascular disease 05/05/2015   Vitamin D deficiency 05/05/2015    REFERRING DIAG:  I63.9 (DVV-[id number]) - Acute embolic stroke (HCC)  V37.1 (ICD-10-CM) - Left-sided weakness    THERAPY DIAG:  Muscle weakness (generalized)  Other lack of coordination  Other abnormalities of gait and mobility  Rationale for Evaluation and Treatment Rehabilitation  PERTINENT HISTORY: multiple prior strokes     PRECAUTIONS: fall  SUBJECTIVE: "My left leg isn't working today." Otherwise states that she's okay.   PAIN:  Are you having pain? Yes: NPRS scale: 8/10 Pain location: chronic pain at back of head and shoulders/neck   VITALS:  BP: 162/87  TODAY'S TREATMENT:    NMR:    -[place] level 1 x10 mins B UE/LE  -HEP (see below) with seated rest breaks throughout   PATIENT EDUCATION: Education details: HEP Person educated: Patient Education method: Explanation Education comprehension: verbalized understanding     HOME EXERCISE PROGRAM: Access Code: [email or web address] URL: [email or web address] Date: 01/20/2022 Prepared by: Estevan Ryder  Exercises - Supine Bridge  - 1 x daily - 7 x weekly - 3 sets - 10 reps - Supine Lower Trunk Rotation  - 1 x daily - 7 x weekly - 3 sets - 10 reps - Sit to Stand Without Arm Support  - 1 x daily - 7 x weekly - 3 sets - 10 reps - Standing Hip Abduction with Counter Support  - 1 x daily - 7 x weekly - 3 sets - 10 reps - Standing Hip Extension with Counter Support  - 1  x daily - 7 x weekly - 3 sets - 10 reps - Standing March with Counter Support  - 1 x daily - 7 x weekly - 3 sets - 10 reps - Mini Squat with Counter Support  - 1 x daily - 7 x weekly - 3 sets - 10 reps     GOALS: Goals reviewed with patient? Yes   SHORT TERM GOALS: Target date: 02/03/2022   Pt will be independent with initial HEP for improved ambulatory balance and functional strength  Baseline: to be provided  Goal status: INITIAL   2.  Pt will improve FGA to >/= 11/30 to demonstrate improved balance and reduced fall risk   Baseline: 6/30 Goal status: INITIAL   3.  Pt will improve gait speed to >/= .47m/s  to demonstrate improved community ambulation   Baseline: .42m/s Goal status: INITIAL   4.  Patient will improve BBS score to >/=52/56 Baseline: 44/56 Goal status: INITIAL       LONG TERM GOALS: Target date: 02/24/2022   Pt will be independent with final HEP for improved ambulatory balance and functional strength   Baseline: to be provided Goal status: INITIAL   2.  Pt will improve FGA to >/= 16/30 to demonstrate improved balance and reduced fall risk Baseline: 6/30 Goal status: INITIAL   3.  Pt will improve gait speed to >/= .85m/s to demonstrate improved community ambulation   Baseline: .41m/s Goal status: INITIAL       ASSESSMENT:   CLINICAL IMPRESSION: Patient seen for skilled PT session with emphasis on HEP establishment. She demonstrates functional strength in B LE, but decreased endurance noted. Continue POC.    OBJECTIVE IMPAIRMENTS Abnormal gait, decreased activity tolerance, decreased balance, decreased endurance, decreased knowledge of condition, difficulty walking, decreased strength, and pain.    ACTIVITY LIMITATIONS carrying, lifting, bending, standing, squatting, stairs, transfers, locomotion level, and caring for others   PARTICIPATION LIMITATIONS: meal prep, cleaning, driving, shopping, and community activity   PERSONAL FACTORS Behavior  pattern, Past/current experiences, Transportation, and 1-2 comorbidities: HTN, multiple prior CVAs  are also affecting patient's functional outcome.    REHAB POTENTIAL: Good   CLINICAL DECISION MAKING: Stable/uncomplicated   EVALUATION COMPLEXITY: Low   PLAN: PT FREQUENCY: 1x/week per patient request   PT DURATION: 6 weeks   PLANNED INTERVENTIONS: Therapeutic exercises, Therapeutic activity, Neuromuscular re-education, Balance training, Gait training, Patient/Family education, Self Care, Joint mobilization, Stair training, Vestibular training, Visual/preceptual remediation/compensation, Orthotic/Fit training, DME instructions, Aquatic Therapy, Cognitive remediation, Electrical stimulation, Cryotherapy, Moist heat, Taping, Manual therapy, and Re-evaluation   PLAN FOR NEXT SESSION: trial SPC, high level balance, endurance  Debbora Dus, PT Debbora Dus, PT, DPT, CBIS  01/20/2022, 10:16 AM

## 2022-01-23 ENCOUNTER — Encounter (HOSPITAL_BASED_OUTPATIENT_CLINIC_OR_DEPARTMENT_OTHER): Payer: Self-pay | Admitting: Cardiovascular Disease

## 2022-01-23 ENCOUNTER — Ambulatory Visit (INDEPENDENT_AMBULATORY_CARE_PROVIDER_SITE_OTHER): Payer: Medicare Other | Admitting: Cardiovascular Disease

## 2022-01-23 VITALS — BP 124/80 | HR 77 | Ht 62.0 in | Wt 138.6 lb

## 2022-01-23 DIAGNOSIS — N184 Chronic kidney disease, stage 4 (severe): Secondary | ICD-10-CM | POA: Diagnosis not present

## 2022-01-23 DIAGNOSIS — I10 Essential (primary) hypertension: Secondary | ICD-10-CM

## 2022-01-23 DIAGNOSIS — I6302 Cerebral infarction due to thrombosis of basilar artery: Secondary | ICD-10-CM

## 2022-01-23 DIAGNOSIS — I639 Cerebral infarction, unspecified: Secondary | ICD-10-CM | POA: Diagnosis not present

## 2022-01-23 DIAGNOSIS — E7800 Pure hypercholesterolemia, unspecified: Secondary | ICD-10-CM | POA: Diagnosis not present

## 2022-01-23 NOTE — Patient Instructions (Signed)
Medication Instructions:  Continue current medication  *If you need a refill on your cardiac medications before your next appointment, please call your pharmacy*   Lab Work: None Ordered   Testing/Procedures: None Ordered   Follow-Up: At [facility], you and your health needs are our priority.  As part of our continuing mission to provide you with exceptional heart care, we have created designated Provider Care Teams.  These Care Teams include your primary Cardiologist (physician) and Advanced Practice Providers (APPs -  Physician Assistants and Nurse Practitioners) who all work together to provide you with the care you need, when you need it.  We recommend signing up for the patient portal called "MyChart".  Sign up information is provided on this After Visit Summary.  MyChart is used to connect with patients for Virtual Visits (Telemedicine).  Patients are able to view lab/test results, encounter notes, upcoming appointments, etc.  Non-urgent messages can be sent to your provider as well.   To learn more about what you can do with MyChart, go to [email or web address].    Your next appointment:   6 month(s)  The format for your next appointment:   In Person  Provider:   Skeet Latch, MD    Other Instructions   Important Information About Sugar

## 2022-01-23 NOTE — Progress Notes (Signed)
Cardiology Office Note   Date:  01/30/2022   ID:  Christina Orozco, DOB 12-24-1970, MRN [id number]  PCP:  Jenel Lucks, PA-C  Cardiologist:   Skeet Latch, MD  Nephrologist: Lauralyn Primes, MD [facility]  No chief complaint on file.    History of Present Illness: Christina Orozco is a 51 y.o. female with hypertension, severe LVH, diabetes, recurrent stroke, seizures, CKD IV, and asthma who presents for follow-up. She was initially seen 12/2016 for an evaluation of hypertension and LVH.  She had an echo 04/2016 that revealed LVEF 65-70% and mild basal septum hypertrophy.  She had a repeat echo 12/01/16 that showed LVEF 65-70% and severe LVH.  She reports that she was initially diagnosed with hypertension in high school.  The only itme it was well-controlled was when she was pregnant. She has several family members with hypertension.  Her mother had a heart attack at age 79 and several other family members had premature CAD.  Carvedilol was increased, amlodipine and doxazosin were added.    Christina Orozco subsequently admitted 07/2017 with an acute stroke of the left paramedian pons.  She presented with dizziness and her needed admission blood pressure was 148/85.  Carotid Dopplers revealed 1-39% ICA stenosis and echo was unchanged from 11/2016.  She was scheduled for TEE 08/2017 that revealed no PFO.  A loop recorder was placed at that time.  The loop recorder was removed 07/2019.  She was admitted 02/2019 after recurrent stroke.  She followed up with Kerin Ransom, PA-C.  There is concern that her blood pressure has been too low after losing over 50 lb. Hydralazine was discontinued.  She followed up with Lurena Joiner later that month and her blood pressure was more stable. \She followed up with Laurann Montana, NP for preoperative clearance on 09/2021.  At that time she had lower extremity edema.  Her amlodipine was reduced and hydralazine was increased.  She was also encouraged to wear compression  socks.  Christina Orozco was hospitalized 11/8339 with embolic stroke.  She presented with left-sided weakness.  MRI/MRA revealed a 1.9 cm acute ischemic stroke in the right frontal centrum ovale.  Aspirin and ticagrelor were recommended.  She had acute acute on chronic renal failure.  Renal ultrasound revealed medical renal disease.  She was discharged to [facility] inpatient rehab.  She is doing outpatient rehab on Fridays. She has been working on getting the strength on the L side and walking.  She notes that she had the smell of cigarettes and neurologic changes on the L side.  She was dragging her L leg and her L hand felt numb.  There is concern that she may have small vessel disease and neurology is planning do an LP.  She has no chest pain and her breathing is stable.  She has no LE edema, orhtopnea or PND.    Past Medical History:  Diagnosis Date   Anemia    severe   Anxiety    Asthma    CKD (chronic kidney disease)    stage IV   DDD (degenerative disc disease), cervical    Depression    Diabetes mellitus (HCC)    Dislocated shoulder    right   GERD (gastroesophageal reflux disease)    Headache    migraines (about once a month)   History of kidney stones    Hypertension    Neuropathy    Peripheral vascular disease ([place])    Pneumonia    Stroke ([place])  Past Surgical History:  Procedure Laterality Date   ADENOIDECTOMY     APPENDECTOMY     CERVICAL SPINE SURGERY  06/2012   C5-C6   EYE SURGERY     left eye surgery    FOOT SURGERY     KENALOG INJECTION Left 03/10/2021   Procedure: Intravitreal Avastin Injection;  Surgeon: Sherlynn Stalls, MD;  Location: [place];  Service: Ophthalmology;  Laterality: Left;   LOOP RECORDER INSERTION N/A 08/27/2017   Procedure: LOOP RECORDER INSERTION;  Surgeon: Sanda Klein, MD;  Location: [place] [facility];  Service: Cardiovascular;  Laterality: N/A;   MEMBRANE PEEL Left 03/10/2021   Procedure: MEMBRANE PEEL;  Surgeon: Sherlynn Stalls, MD;  Location: [place];  Service: Ophthalmology;  Laterality: Left;   PARS PLANA VITRECTOMY Left 03/10/2021   Procedure: TWENTY-FIVE GAUGE PARS PLANA VITRECTOMY;  Surgeon: Sherlynn Stalls, MD;  Location: [place];  Service: Ophthalmology;  Laterality: Left;   PHOTOCOAGULATION WITH LASER Left 03/10/2021   Procedure: PHOTOCOAGULATION WITH LASER;  Surgeon: Sherlynn Stalls, MD;  Location: [place];  Service: Ophthalmology;  Laterality: Left;   SHOULDER SURGERY     TEE WITHOUT CARDIOVERSION N/A 08/27/2017   Procedure: TRANSESOPHAGEAL ECHOCARDIOGRAM (TEE);  Surgeon: Sanda Klein, MD;  Location: [facility];  Service: Cardiovascular;  Laterality: N/A;   TONSILLECTOMY       Current Outpatient Medications  Medication Sig Dispense Refill   acetaminophen (TYLENOL) 325 MG tablet Take 1-2 tablets (325-650 mg total) by mouth every 4 (four) hours as needed for mild pain.     albuterol (PROAIR HFA) 108 (90 Base) MCG/ACT inhaler INHALE 1 OR 2 PUFFS INTO THE LUNGS EVERY 6 HOURS AS NEEDED FOR WHEEZING OR SHORTNESS OF BREATH (Patient taking differently: Inhale 1-2 puffs into the lungs every 6 (six) hours as needed for shortness of breath or wheezing.) 8.5 g 0   amLODipine (NORVASC) 5 MG tablet Take 1 tablet (5 mg total) by mouth daily. 90 tablet 2   aspirin EC 81 MG tablet Take 1 tablet (81 mg total) by mouth daily. 100 tablet 5   atorvastatin (LIPITOR) 80 MG tablet Take 80 mg by mouth at bedtime.     carboxymethylcellulose (REFRESH PLUS) 0.5 % SOLN Place 1 drop into both eyes daily as needed (dry eyes).     carvedilol (COREG) 12.5 MG tablet Take 1 tablet (12.5 mg total) by mouth 2 (two) times daily with a meal. 180 tablet 2   diclofenac Sodium (VOLTAREN) 1 % GEL Apply 2 g topically daily as needed (pain).     DULoxetine (CYMBALTA) 60 MG capsule Take 60 mg by mouth at bedtime.     glucose blood (CONTOUR NEXT TEST) test strip 1 each by Other route 2 (two) times daily. And lancets 2/day (Patient taking differently: 1 each by Other route 2  (two) times daily.) 100 each 12   Menthol-Methyl Salicylate (MUSCLE RUB) 10-15 % CREA Apply 1 Application topically 2 (two) times daily as needed for muscle pain. 85 g 0   methocarbamol (ROBAXIN) 500 MG tablet Take 1 tablet (500 mg total) by mouth every 6 (six) hours as needed for muscle spasms. 45 tablet 0   montelukast (SINGULAIR) 10 MG tablet Take 10 mg by mouth at bedtime.     pantoprazole (PROTONIX) 40 MG tablet Take 1 tablet (40 mg total) by mouth daily. 30 tablet 0   Semaglutide,0.25 or 0.5MG /DOS, (OZEMPIC, 0.25 OR 0.5 MG/DOSE,) 2 MG/1.5ML SOPN Inject 0.5 mg into the skin See admin instructions. Taking every 10  days     senna-docusate (SENOKOT-S) 8.6-50 MG tablet Take 2 tablets by mouth 2 (two) times daily. 30 tablet 0   ticagrelor (BRILINTA) 90 MG TABS tablet Take 1 tablet by mouth in the morning and at bedtime.     traZODone (DESYREL) 50 MG tablet Take 1 tablet (50 mg total) by mouth at bedtime as needed for sleep. 30 tablet 0   No current facility-administered medications for this visit.    Allergies:   Lisinopril, Penicillins, and Gabapentin    Social History:  The patient  reports that she has never smoked. She has never used smokeless tobacco. She reports that she does not drink alcohol and does not use drugs.   Family History:  The patient's family history includes Breast cancer (age of onset: 36) in her sister; CAD in her mother; Cancer in an other family member; Diabetes in an other family member; Heart disease in an other family member; Heart failure in her mother; Seizures in her maternal uncle; Vascular Disease in her mother.    ROS:  Please see the history of present illness.   Otherwise, review of systems are positive for none.   All other systems are reviewed and negative.    PHYSICAL EXAM: VS:  BP 124/80 (BP Location: Left Arm, Patient Position: Sitting, Cuff Size: Normal)   Pulse 77   Ht 5\' 2"  (1.575 m)   Wt 138 lb 9.6 oz (62.9 kg)   LMP 05/22/2018 (Within Days)    SpO2 97%   BMI 25.35 kg/m  , BMI Body mass index is 25.35 kg/m. GENERAL:  Well appearing HEENT: Pupils equal round and reactive, fundi not visualized, oral mucosa unremarkable NECK:  No jugular venous distention, waveform within normal limits, carotid upstroke brisk and symmetric, no bruits LUNGS:  Clear to auscultation bilaterally HEART:  RRR.  PMI not displaced or sustained,S1 and S2 within normal limits, no S3, no S4, no clicks, no rubs, no murmurs ABD:  Flat, positive bowel sounds normal in frequency in pitch, no bruits, no rebound, no guarding, no midline pulsatile mass, no hepatomegaly, no splenomegaly EXT:  2+ radial pulses.  Absent TP.  1+ DP bilaterally.  No edema, no cyanosis no clubbing SKIN:  No rashes no nodules NEURO:  Cranial nerves II through XII grossly intact, motor grossly intact throughout PSYCH:  Cognitively intact, oriented to person place and time   EKG:  EKG is not ordered today. The ekg ordered 12/14/16 demonstrates sinus rhythm.  Rate 75 bpm.  Cannot rule out prior septal infarct.   09/18/17: Sinus rhythm.  Rate 78 bpm.  Cannot rule out prior septal infarct.   Recent Labs: 12/23/2021: Magnesium 2.1 12/24/2021: ALT 14 12/26/2021: BUN 36; Creatinine, Ser 3.11; Hemoglobin 9.4; Platelets 210; Potassium 3.9; Sodium 141    Lipid Panel    Component Value Date/Time   CHOL 100 12/20/2021 0116   CHOL 135 06/17/2020 1053   TRIG 73 12/20/2021 0116   HDL 49 12/20/2021 0116   HDL 47 06/17/2020 1053   CHOLHDL 2.0 12/20/2021 0116   VLDL 15 12/20/2021 0116   LDLCALC 36 12/20/2021 0116   LDLCALC 63 06/17/2020 1053      Wt Readings from Last 3 Encounters:  01/23/22 138 lb 9.6 oz (62.9 kg)  01/17/22 142 lb 6.4 oz (64.6 kg)  12/23/21 135 lb 9.3 oz (61.5 kg)      ASSESSMENT AND PLAN:  No problem-specific Assessment & Plan notes found for this encounter.  # Hypertensive heart disease:  Blood pressures well controlled on amlodipine and carvedilol.  # CKD IV:   She is followed by nephrology at [facility].  She is euvolemic today and blood pressure is well-controlled.  # Recurrent stroke:  # Hyperlipidemia: Fortunately she has had recurrent strokes.  She is now on aspirin and ticagrelor indefinitely.  Blood pressure well controlled as above.  Lipids are very well controlled.  Her LDL goal is less than 55 given high risk of recurrent ASCVD.   Current medicines are reviewed at length with the patient today.  The patient does not have concerns regarding medicines.  The following changes have been made:    Labs/ tests ordered today include:   No orders of the defined types were placed in this encounter.    Disposition:   FU with Shernita Rabinovich C. Oval Linsey, MD, [facility] in 6 months.   Signed, Terance Pomplun C. Oval Linsey, MD, [facility]  01/30/2022 7:26 AM    [facility]

## 2022-01-27 ENCOUNTER — Ambulatory Visit: Payer: Medicare Other | Admitting: Physical Therapy

## 2022-01-30 ENCOUNTER — Encounter (HOSPITAL_BASED_OUTPATIENT_CLINIC_OR_DEPARTMENT_OTHER): Payer: Self-pay | Admitting: Cardiovascular Disease

## 2022-02-03 ENCOUNTER — Ambulatory Visit: Payer: Medicare Other | Admitting: Occupational Therapy

## 2022-02-03 ENCOUNTER — Telehealth (HOSPITAL_BASED_OUTPATIENT_CLINIC_OR_DEPARTMENT_OTHER): Payer: Self-pay

## 2022-02-03 ENCOUNTER — Ambulatory Visit: Payer: Medicare Other | Admitting: Physical Therapy

## 2022-02-03 ENCOUNTER — Encounter: Payer: Self-pay | Admitting: Occupational Therapy

## 2022-02-03 VITALS — BP 145/93 | HR 87

## 2022-02-03 DIAGNOSIS — R2689 Other abnormalities of gait and mobility: Secondary | ICD-10-CM

## 2022-02-03 DIAGNOSIS — R29818 Other symptoms and signs involving the nervous system: Secondary | ICD-10-CM

## 2022-02-03 DIAGNOSIS — I6302 Cerebral infarction due to thrombosis of basilar artery: Secondary | ICD-10-CM

## 2022-02-03 DIAGNOSIS — R278 Other lack of coordination: Secondary | ICD-10-CM

## 2022-02-03 DIAGNOSIS — R41840 Attention and concentration deficit: Secondary | ICD-10-CM

## 2022-02-03 DIAGNOSIS — M6281 Muscle weakness (generalized): Secondary | ICD-10-CM

## 2022-02-03 DIAGNOSIS — G8929 Other chronic pain: Secondary | ICD-10-CM

## 2022-02-03 DIAGNOSIS — M25611 Stiffness of right shoulder, not elsewhere classified: Secondary | ICD-10-CM

## 2022-02-03 NOTE — Therapy (Signed)
OUTPATIENT PHYSICAL THERAPY TREATMENT NOTE   Patient Name: Christina Orozco MRN: [id number] DOB:10/11/1970, 51 y.o., female Today's Date: 02/03/2022  PCP: Jenel Lucks, PA-C REFERRING PROVIDER: Reesa Chew, PA-C  END OF SESSION:   PT End of Session - 02/03/22 1015     Visit Number 3    Number of Visits 7    Date for PT Re-Evaluation 02/24/22    Authorization Type MCD healthy blue    PT Start Time 4268    PT Stop Time 1056    PT Time Calculation (min) 41 min    Equipment Utilized During Treatment Gait belt    Activity Tolerance Patient tolerated treatment well    Behavior During Therapy WFL for tasks assessed/performed;Flat affect              Past Medical History:  Diagnosis Date   Anemia    severe   Anxiety    Asthma    CKD (chronic kidney disease)    stage IV   DDD (degenerative disc disease), cervical    Depression    Diabetes mellitus ([place])    Dislocated shoulder    right   GERD (gastroesophageal reflux disease)    Headache    migraines (about once a month)   History of kidney stones    Hypertension    Neuropathy    Peripheral vascular disease ([place])    Pneumonia    Stroke [facility])    Past Surgical History:  Procedure Laterality Date   ADENOIDECTOMY     APPENDECTOMY     CERVICAL SPINE SURGERY  06/2012   C5-C6   EYE SURGERY     left eye surgery    FOOT SURGERY     KENALOG INJECTION Left 03/10/2021   Procedure: Intravitreal Avastin Injection;  Surgeon: Sherlynn Stalls, MD;  Location: [place];  Service: Ophthalmology;  Laterality: Left;   LOOP RECORDER INSERTION N/A 08/27/2017   Procedure: LOOP RECORDER INSERTION;  Surgeon: Sanda Klein, MD;  Location: [place] CV [facility];  Service: Cardiovascular;  Laterality: N/A;   MEMBRANE PEEL Left 03/10/2021   Procedure: MEMBRANE PEEL;  Surgeon: Sherlynn Stalls, MD;  Location: [place];  Service: Ophthalmology;  Laterality: Left;   PARS PLANA VITRECTOMY Left 03/10/2021   Procedure: TWENTY-FIVE GAUGE PARS  PLANA VITRECTOMY;  Surgeon: Sherlynn Stalls, MD;  Location: [place];  Service: Ophthalmology;  Laterality: Left;   PHOTOCOAGULATION WITH LASER Left 03/10/2021   Procedure: PHOTOCOAGULATION WITH LASER;  Surgeon: Sherlynn Stalls, MD;  Location: [place];  Service: Ophthalmology;  Laterality: Left;   SHOULDER SURGERY     TEE WITHOUT CARDIOVERSION N/A 08/27/2017   Procedure: TRANSESOPHAGEAL ECHOCARDIOGRAM (TEE);  Surgeon: Sanda Klein, MD;  Location: [facility] ENDOSCOPY;  Service: Cardiovascular;  Laterality: N/A;   TONSILLECTOMY     Patient Active Problem List   Diagnosis Date Noted   AKI (acute kidney injury) ([place]) 34/19/6222   Acute embolic stroke ([place]) 97/98/9211   Hypernatremia 12/20/2021   Chronic kidney disease (CKD), stage IV (severe) ([place]) 12/19/2021   Left-sided weakness and visual deficits  12/19/2021   Vision abnormalities 12/19/2021   Stroke ([place]) 11/10/2021   Depression 07/05/2020   CVA (cerebral vascular accident) ([place]) 06/21/2020   CVA (cerebrovascular accident) ([place]) 06/19/2020   Generalized anxiety disorder 03/01/2019   Abnormality of gait 10/14/2018   Dizziness and giddiness 09/13/2018   Debility 06/26/2018   Chronic nonintractable headache 94/17/4081   Diastolic dysfunction    Congestion of nasal sinus    Pneumonia of left lower lobe  due to infectious organism    SOB (shortness of breath)    Candidiasis    Anemia    Hypoalbuminemia due to protein-calorie malnutrition (HCC)    Hypertension    Diabetes mellitus ([place])    PRES (posterior reversible encephalopathy syndrome)    Hypokalemia    History of CVA with residual deficit    History of seizure 03/25/2018   Bilateral carpal tunnel syndrome 02/14/2018   Arthritis 10/31/2017   Asthma 10/31/2017   Constipation 10/31/2017   Kidney stone 10/31/2017   Anemia, chronic disease 08/09/2017   Hyperlipidemia 07/26/2017   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[place]) 07/25/2017   Anxiety as acute reaction to exceptional stress 04/23/2017   Insomnia  10/08/2016   Neuropathy 06/07/2016   Gait instability    Lesion of pons    Slurred speech    Dizziness 04/30/2016   Hyperglycemia 04/30/2016   Chronic kidney disease (CKD), stage III (moderate) ([place]) 04/30/2016   Ataxia 04/30/2016   Chronic right shoulder pain 04/03/2016   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   Rhinitis, allergic 08/25/2015   Asthma, mild intermittent 07/14/2015   Gastroesophageal reflux disease without esophagitis 06/08/2015   Hypertensive cardiovascular disease 05/05/2015   Vitamin D deficiency 05/05/2015    REFERRING DIAG:  I63.9 (TXH-74-FS) - Acute embolic stroke (HCC)  E39.5 (ICD-10-CM) - Left-sided weakness    THERAPY DIAG:  Muscle weakness (generalized)  Other abnormalities of gait and mobility  Other lack of coordination  Rationale for Evaluation and Treatment Rehabilitation  PERTINENT HISTORY: multiple prior strokes     PRECAUTIONS: fall  SUBJECTIVE: Pt reports no changes since last session. Pt reports pain in her R knee, L elblow, and neck. Has been using muslce cream for pain management and plans to follow up with PCP regarding pain.  PAIN:  Are you having pain? Yes: NPRS scale: 8/10 Pain location: chronic pain at back of head and shoulders/neck   VITALS:   Today's Vitals   02/03/22 1018  BP: (!) 145/93  Pulse: 87    TODAY'S TREATMENT:    GAIT: Gait pattern: step through pattern and ataxic (slightly) Distance walked: 115 ft Assistive device utilized: Single point cane "hurricane" Level of assistance: CGA Comments: pt reports not feeling as steady as with RW, minor ataxia and path deviation   Gait pattern: step through pattern; decreased ataxia from use of "hurricane" Distance walked: 115 ft Assistive device utilized: Single point cane Level of assistance: SBA and CGA Comments: CGA initially with progression to SBA; encouraged pt to walk around her home with [facility] to trial it and report back next session how it  went       THER ACT:  [facility] PT Assessment - 02/03/22 1021       Ambulation/Gait   Gait velocity 32.8' over 15.66 sec = 2.1 ft/sec      Standardized Balance Assessment   Standardized Balance Assessment Berg Balance Test      Berg Balance Test   Sit to Stand Able to stand without using hands and stabilize independently    Standing Unsupported Able to stand safely 2 minutes    Sitting with Back Unsupported but Feet Supported on Floor or Stool Able to sit safely and securely 2 minutes    Stand to Sit Sits safely with minimal use of hands    Transfers Able to transfer safely, minor use of hands    Standing Unsupported with Eyes Closed Able to stand 10 seconds with supervision    Standing Unsupported with Feet Together  Able to place feet together independently and stand for 1 minute with supervision    From Standing, Reach Forward with Outstretched Arm Can reach forward >12 cm safely (5")    From Standing Position, Pick up Object from [place] to pick up shoe, needs supervision    From Standing Position, Turn to Look Behind Over each Shoulder Looks behind from both sides and weight shifts well    Turn 360 Degrees Able to turn 360 degrees safely but slowly    Standing Unsupported, Alternately Place Feet on Step/Stool Able to complete 4 steps without aid or supervision    Standing Unsupported, One Foot in [place] to take small step independently and hold 30 seconds    Standing on One Leg Able to lift leg independently and hold 5-10 seconds    Total Score 45    Berg comment: 45/56, significant fall risk      Functional Gait  Assessment   Gait assessed  Yes    Gait Level Surface Walks 20 ft, slow speed, abnormal gait pattern, evidence for imbalance or deviates 10-15 in outside of the 12 in walkway width. Requires more than 7 sec to ambulate 20 ft.    Change in Gait Speed Able to change speed, demonstrates mild gait deviations, deviates 6-10 in outside of the 12 in walkway width, or no  gait deviations, unable to achieve a major change in velocity, or uses a change in velocity, or uses an assistive device.    Gait with Horizontal Head Turns Performs head turns with moderate changes in gait velocity, slows down, deviates 10-15 in outside 12 in walkway width but recovers, can continue to walk.    Gait with Vertical Head Turns Performs task with moderate change in gait velocity, slows down, deviates 10-15 in outside 12 in walkway width but recovers, can continue to walk.    Gait and Pivot Turn Pivot turns safely in greater than 3 sec and stops with no loss of balance, or pivot turns safely within 3 sec and stops with mild imbalance, requires small steps to catch balance.    Step Over Obstacle Is able to step over one shoe box (4.5 in total height) but must slow down and adjust steps to clear box safely. May require verbal cueing.    Gait with Narrow Base of Support Is able to ambulate for 10 steps heel to toe with no staggering.   with RW   Gait with Eyes Closed Walks 20 ft, uses assistive device, slower speed, mild gait deviations, deviates 6-10 in outside 12 in walkway width. Ambulates 20 ft in less than 9 sec but greater than 7 sec.    Ambulating Backwards Walks 20 ft, slow speed, abnormal gait pattern, evidence for imbalance, deviates 10-15 in outside 12 in walkway width.    Steps Cannot do safely.   refuses   Total Score 14              Ambulation with SPC across unven surface of mat on the floor, 2 x 10 ft with SBA   Alt L/R 6" step-taps with SPC and CGA for balance, decreased control of LLE noted   PATIENT EDUCATION: Education details: continue HEP, work on gait in the home with SPC vs RW Person educated: Patient Education method: Explanation Education comprehension: verbalized understanding     HOME EXERCISE PROGRAM: Access Code: [email or web address] URL: [email or web address] Date: 01/20/2022 Prepared by: Estevan Ryder  Exercises - Supine Bridge  - 1 x  daily -  7 x weekly - 3 sets - 10 reps - Supine Lower Trunk Rotation  - 1 x daily - 7 x weekly - 3 sets - 10 reps - Sit to Stand Without Arm Support  - 1 x daily - 7 x weekly - 3 sets - 10 reps - Standing Hip Abduction with Counter Support  - 1 x daily - 7 x weekly - 3 sets - 10 reps - Standing Hip Extension with Counter Support  - 1 x daily - 7 x weekly - 3 sets - 10 reps - Standing March with Counter Support  - 1 x daily - 7 x weekly - 3 sets - 10 reps - Mini Squat with Counter Support  - 1 x daily - 7 x weekly - 3 sets - 10 reps     GOALS: Goals reviewed with patient? Yes   SHORT TERM GOALS: Target date: 02/03/2022   Pt will be independent with initial HEP for improved ambulatory balance and functional strength  Baseline: to be provided  Goal status: MET   2.  Pt will improve FGA to >/= 11/30 to demonstrate improved balance and reduced fall risk  Baseline: 6/30, 14/30 (8/25) Goal status: MET   3.  Pt will improve gait speed to >/= .74ms to demonstrate improved community ambulation  Baseline: .363m, .6462m(8/25) Goal status: MET   4.  Patient will improve BBS score to >/=52/56 Baseline: 44/56, 45/56 (8/25) Goal status: IN PROGRESS       LONG TERM GOALS: Target date: 02/24/2022   Pt will be independent with final HEP for improved ambulatory balance and functional strength   Baseline: to be provided Goal status: INITIAL   2.  Pt will improve FGA to >/= 16/30 to demonstrate improved balance and reduced fall risk Baseline: 6/30, 14/30 Goal status: INITIAL   3.  Pt will improve gait speed to >/= .14m/614mo demonstrate improved community ambulation   Baseline: .314m/90m14m/s114ml status: MET, GOAL UPGRADED        ASSESSMENT:   CLINICAL IMPRESSION: Emphasis of skilled PT session on reassessing STG and working on gait with LRAD. Pt has met 3/4 STG due to being independent with her initial HEP, improving her gait speed from 0.37 m/s to 0.64 m/s, improving her FGA score from  6/30 to 14/30, and even though she did not yet meet her STG for improved BBS score she did improve her score from 44/56 to 45/56. Pt demonstrates improved balance overall as evidenced by improvement in scores. Pt also exhibits improved gait with use of SPC this date and can benefit from improved practice with use of this device across various terrains and with various balance challenges as well as ongoing skilled therapy services to address balance, strength, and coordination impairments. Continue POC.    OBJECTIVE IMPAIRMENTS Abnormal gait, decreased activity tolerance, decreased balance, decreased endurance, decreased knowledge of condition, difficulty walking, decreased strength, and pain.    ACTIVITY LIMITATIONS carrying, lifting, bending, standing, squatting, stairs, transfers, locomotion level, and caring for others   PARTICIPATION LIMITATIONS: meal prep, cleaning, driving, shopping, and community activity   PERSONAL FACTORS Behavior pattern, Past/current experiences, Transportation, and 1-2 comorbidities: HTN, multiple prior CVAs  are also affecting patient's functional outcome.    REHAB POTENTIAL: Good   CLINICAL DECISION MAKING: Stable/uncomplicated   EVALUATION COMPLEXITY: Low   PLAN: PT FREQUENCY: 1x/week per patient request   PT DURATION: 6 weeks   PLANNED INTERVENTIONS: Therapeutic exercises, Therapeutic activity, Neuromuscular re-education, Balance training, Gait  training, Patient/Family education, Self Care, Joint mobilization, Stair training, Vestibular training, Visual/preceptual remediation/compensation, Orthotic/Fit training, DME instructions, Aquatic Therapy, Cognitive remediation, Electrical stimulation, Cryotherapy, Moist heat, Taping, Manual therapy, and Re-evaluation   PLAN FOR NEXT SESSION: trial SPC across various surfaces/terrain, high level balance, endurance, dot and ladder drills  Excell Seltzer, PT Excell Seltzer, PT, DPT, CSRS    02/03/2022, 10:57 AM

## 2022-02-03 NOTE — Patient Instructions (Signed)
  Coordination Activities  Perform the following activities for 20 minutes 1 times per day with left hand(s).  Rotate ball in fingertips (clockwise and counter-clockwise). Flip cards 1 at a time as fast as you can. Deal cards with your thumb (Hold deck in hand and push card off top with thumb). Pick up coins and stack. Pick up coins one at a time until you get 5-10 in your hand, then move coins from palm to fingertips to stack one at a time.   Shoulder: Flexion (Supine)    With hands shoulder width apart, slowly lower dowel to floor behind head. Do not let elbows bend. Keep back flat. Hold __3__ seconds. Repeat _10___ times. Do _1___ sessions per day. Stop at eye level  Then perform gentle chest press 10 reps 1x day laying down, stop if pain

## 2022-02-03 NOTE — Therapy (Signed)
OUTPATIENT OCCUPATIONAL THERAPY NEURO TREATMENT  Patient Name: Christina Orozco MRN: [id number] DOB:[birth year], 51 y.o., female Today's Date: 02/03/2022  PCP: Dr. Rayburn Go REFERRING PROVIDER: Dr. Letta Pate   OT End of Session - 02/03/22 [phone]     Visit Number 2    Number of Visits 12    Authorization Type Medicare    Authorization Time Period POC written for 12 weeks, anticipate d/c after 4-6 weeks    Authorization - Visit Number 2    Progress Note Due on Visit 10    OT Start Time 0934    OT Stop Time 1013    OT Time Calculation (min) 39 min    Behavior During Therapy WFL for tasks assessed/performed;Flat affect              Past Medical History:  Diagnosis Date   Anemia    severe   Anxiety    Asthma    CKD (chronic kidney disease)    stage IV   DDD (degenerative disc disease), cervical    Depression    Diabetes mellitus ([place])    Dislocated shoulder    right   GERD (gastroesophageal reflux disease)    Headache    migraines (about once a month)   History of kidney stones    Hypertension    Neuropathy    Peripheral vascular disease ([place])    Pneumonia    Stroke [facility])    Past Surgical History:  Procedure Laterality Date   ADENOIDECTOMY     APPENDECTOMY     CERVICAL SPINE SURGERY  06/2012   C5-C6   EYE SURGERY     left eye surgery    FOOT SURGERY     KENALOG INJECTION Left 03/10/2021   Procedure: Intravitreal Avastin Injection;  Surgeon: Sherlynn Stalls, MD;  Location: [place];  Service: Ophthalmology;  Laterality: Left;   LOOP RECORDER INSERTION N/A 08/27/2017   Procedure: LOOP RECORDER INSERTION;  Surgeon: Sanda Klein, MD;  Location: [place] [facility];  Service: Cardiovascular;  Laterality: N/A;   MEMBRANE PEEL Left 03/10/2021   Procedure: MEMBRANE PEEL;  Surgeon: Sherlynn Stalls, MD;  Location: [place];  Service: Ophthalmology;  Laterality: Left;   PARS PLANA VITRECTOMY Left 03/10/2021   Procedure: TWENTY-FIVE GAUGE PARS PLANA VITRECTOMY;  Surgeon:  Sherlynn Stalls, MD;  Location: [place];  Service: Ophthalmology;  Laterality: Left;   PHOTOCOAGULATION WITH LASER Left 03/10/2021   Procedure: PHOTOCOAGULATION WITH LASER;  Surgeon: Sherlynn Stalls, MD;  Location: [place];  Service: Ophthalmology;  Laterality: Left;   SHOULDER SURGERY     TEE WITHOUT CARDIOVERSION N/A 08/27/2017   Procedure: TRANSESOPHAGEAL ECHOCARDIOGRAM (TEE);  Surgeon: Sanda Klein, MD;  Location: [facility] ENDOSCOPY;  Service: Cardiovascular;  Laterality: N/A;   TONSILLECTOMY     Patient Active Problem List   Diagnosis Date Noted   AKI (acute kidney injury) ([place]) 27/74/1287   Acute embolic stroke ([place]) 86/76/7209   Hypernatremia 12/20/2021   Chronic kidney disease (CKD), stage IV (severe) ([place]) 12/19/2021   Left-sided weakness and visual deficits  12/19/2021   Vision abnormalities 12/19/2021   Stroke ([place]) 11/10/2021   Depression 07/05/2020   CVA (cerebral vascular accident) ([place]) 06/21/2020   CVA (cerebrovascular accident) ([place]) 06/19/2020   Generalized anxiety disorder 03/01/2019   Abnormality of gait 10/14/2018   Dizziness and giddiness 09/13/2018   Debility 06/26/2018   Chronic nonintractable headache 47/02/6282   Diastolic dysfunction    Congestion of nasal sinus    Pneumonia of left lower lobe due to infectious  organism    SOB (shortness of breath)    Candidiasis    Anemia    Hypoalbuminemia due to protein-calorie malnutrition ([place])    Hypertension    Diabetes mellitus ([place])    PRES (posterior reversible encephalopathy syndrome)    Hypokalemia    History of CVA with residual deficit    History of seizure 03/25/2018   Bilateral carpal tunnel syndrome 02/14/2018   Arthritis 10/31/2017   Asthma 10/31/2017   Constipation 10/31/2017   Kidney stone 10/31/2017   Anemia, chronic disease 08/09/2017   Hyperlipidemia 07/26/2017   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   Anxiety as acute reaction to exceptional stress 04/23/2017   Insomnia 10/08/2016   Neuropathy  06/07/2016   Gait instability    Lesion of pons    Slurred speech    Dizziness 04/30/2016   Hyperglycemia 04/30/2016   Chronic kidney disease (CKD), stage III (moderate) ([place]) 04/30/2016   Ataxia 04/30/2016   Chronic right shoulder pain 04/03/2016   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   Rhinitis, allergic 08/25/2015   Asthma, mild intermittent 07/14/2015   Gastroesophageal reflux disease without esophagitis 06/08/2015   Hypertensive cardiovascular disease 05/05/2015   Vitamin D deficiency 05/05/2015    ONSET DATE: 12/18/21  REFERRING DIAG:  I63.9 (DJS-97-WY) - Acute embolic stroke (HCC)  O37.8 (ICD-10-CM) - Left-sided weakness    THERAPY DIAG:  Other lack of coordination  Muscle weakness (generalized)  Other abnormalities of gait and mobility  Other symptoms and signs involving the nervous system  Attention and concentration deficit  Stiffness of right shoulder, not elsewhere classified  Chronic right shoulder pain  Rationale for Evaluation and Treatment Rehabilitation  SUBJECTIVE:   SUBJECTIVE STATEMENT: Pt reports she wants to get back to where she was prior to this most recent CVA. Pt reports this was her 9th CVA. Pt accompanied by: self  PERTINENT HISTORY:50 y.o. female with medical history significant of CKD stage IV, asthma, anxiety and depression, T2DM, HTN, GERD, PVD, hx of CVA on DAPT who presented to [facility] ED on 7/9 with complaints of left sided weakness and vision changes.MRI revealing acute ischemia, nonhemorrhagic infarct involving deep white matter of posterior R frontal centrum semi ovale. Prior CVA 6/1, hypertension, hyperlipidemia, diabetes, chronic kidney disease, prior strokes, history of seizures     D/c from rehab 12/29/21 Pt has an aide M-F to assist with dressing   PRECAUTIONS: Other: no driving  WEIGHT BEARING RESTRICTIONS No  PAIN:  Are you having pain? Yes: NPRS scale: 8/10 Pain location: L shoulder, hx of RC tear Pain  description: aching Aggravating factors: movement, alleviating unknown  FALLS: Has patient fallen in last 6 months? Yes. Number of falls 1  LIVING ENVIRONMENT: Lives with: lives with their family, pt's dtr goes back to college 01/26/22 Lives in: House/apartment Stairs: No Has following equipment at home: Gilford Rile - 2 wheeled and Shower bench  PLOF: Needs assistance with ADLs and Needs assistance with homemaking  PATIENT GOALS to get back to prior level of function. Pt reports she had assist with ADLs prior to most recent CVA  OBJECTIVE:      TODAY'S TREATMENT:  Coordiantion HEP issued and pt returned demonstration. Supine cane exercises for shoulder flexion and chest press within pain free ROM, min v.c LUE functional reach to place yellow-green graded clothespins on vertical antennae, min difficulty   PATIENT EDUCATION: Education details: coordination and supine cane exercise Person educated: Patient Education method: Adult nurse, handout Education comprehension: verbalized understanding, returned demonstration   HOME  EXERCISE PROGRAM: N/A    GOALS: Goals reviewed with patient? Yes   LONG TERM GOALS: Target date: 04/07/22  I with HEP Baseline:  Goal status: INITIAL  2. Mod I with basic cooking tasks demonstrating good safety awareness Baseline:  Goal status: INITIAL  3.  I with adapted strategies to maximize safety and I with ADLs/IADLS. Baseline:  Goal status: INITIAL    ASSESSMENT:  CLINICAL IMPRESSION: Pt demonstrates understanding of beginning HEP.  PERFORMANCE DEFICITS in functional skills including ADLs, IADLs, coordination, dexterity, sensation, ROM, strength, pain, flexibility, FMC, GMC, mobility, balance, endurance, decreased knowledge of precautions, decreased knowledge of use of DME, vision, and UE functional use, cognitive skills including attention and memory, and psychosocial skills including coping strategies, environmental  adaptation, habits, interpersonal interactions, and routines and behaviors.   IMPAIRMENTS are limiting patient from ADLs, IADLs, play, leisure, and social participation.   COMORBIDITIES may have co-morbidities  that affects occupational performance. Patient will benefit from skilled OT to address above impairments and improve overall function.  MODIFICATION OR ASSISTANCE TO COMPLETE EVALUATION: No modification of tasks or assist necessary to complete an evaluation.  OT OCCUPATIONAL PROFILE AND HISTORY: Detailed assessment: Review of records and additional review of physical, cognitive, psychosocial history related to current functional performance.  CLINICAL DECISION MAKING: LOW - limited treatment options, no task modification necessary  REHAB POTENTIAL: Good  EVALUATION COMPLEXITY: Low    PLAN: OT FREQUENCY: 1x/week  OT DURATION: 12 weeks due to scheduling, anticipate d/c after 4-6 weeks  PLANNED INTERVENTIONS: self care/ADL training, therapeutic exercise, therapeutic activity, neuromuscular re-education, manual therapy, balance training, functional mobility training, splinting, ultrasound, paraffin, fluidotherapy, moist heat, cryotherapy, patient/family education, cognitive remediation/compensation, visual/perceptual remediation/compensation, energy conservation, coping strategies training, and DME and/or AE instructions  RECOMMENDED OTHER SERVICES: PT  CONSULTED AND AGREED WITH PLAN OF CARE: Patient  PLAN FOR NEXT SESSION:  safety for cooking, IADLs, work towards goals.   Samuel Mcpeek, OT 02/03/2022, 9:42 AM

## 2022-02-03 NOTE — Telephone Encounter (Addendum)
Labs ordered and mailed to patient with follow up mychart message     ----- Message from Skeet Latch, MD sent at 02/03/2022  8:09 AM EDT ----- Regarding: LP(a) Please ask this patient to come for a fasting Lp(a) lab test.  I am trying to think of anything that could be contributing to her having recurrent strokes.  Thanks! TCR

## 2022-02-10 ENCOUNTER — Ambulatory Visit: Payer: Medicare Other

## 2022-02-12 ENCOUNTER — Encounter (HOSPITAL_COMMUNITY): Payer: Self-pay

## 2022-02-17 ENCOUNTER — Ambulatory Visit: Payer: Medicare Other | Attending: Physical Medicine and Rehabilitation

## 2022-02-17 DIAGNOSIS — R531 Weakness: Secondary | ICD-10-CM | POA: Insufficient documentation

## 2022-02-17 DIAGNOSIS — M6281 Muscle weakness (generalized): Secondary | ICD-10-CM | POA: Diagnosis present

## 2022-02-17 DIAGNOSIS — M25511 Pain in right shoulder: Secondary | ICD-10-CM | POA: Diagnosis present

## 2022-02-17 DIAGNOSIS — R278 Other lack of coordination: Secondary | ICD-10-CM | POA: Insufficient documentation

## 2022-02-17 DIAGNOSIS — G8929 Other chronic pain: Secondary | ICD-10-CM | POA: Insufficient documentation

## 2022-02-17 DIAGNOSIS — R41840 Attention and concentration deficit: Secondary | ICD-10-CM | POA: Insufficient documentation

## 2022-02-17 DIAGNOSIS — R2689 Other abnormalities of gait and mobility: Secondary | ICD-10-CM | POA: Diagnosis present

## 2022-02-17 DIAGNOSIS — M25611 Stiffness of right shoulder, not elsewhere classified: Secondary | ICD-10-CM | POA: Diagnosis present

## 2022-02-17 DIAGNOSIS — R29818 Other symptoms and signs involving the nervous system: Secondary | ICD-10-CM | POA: Diagnosis present

## 2022-02-17 NOTE — Therapy (Signed)
OUTPATIENT PHYSICAL THERAPY TREATMENT NOTE   Patient Name: Christina Orozco MRN: [id number] DOB:[birth year], 51 y.o., female Today's Date: 02/17/2022  PCP: Jenel Lucks, PA-C REFERRING PROVIDER: Reesa Chew, PA-C  END OF SESSION:   PT End of Session - 02/17/22 0931     Visit Number 4    Number of Visits 7    Date for PT Re-Evaluation 02/24/22    Authorization Type MCD healthy blue    PT Start Time 0930    PT Stop Time 1015    PT Time Calculation (min) 45 min    Equipment Utilized During Treatment Gait belt    Activity Tolerance Patient tolerated treatment well    Behavior During Therapy WFL for tasks assessed/performed;Flat affect              Past Medical History:  Diagnosis Date   Anemia    severe   Anxiety    Asthma    CKD (chronic kidney disease)    stage IV   DDD (degenerative disc disease), cervical    Depression    Diabetes mellitus ([place])    Dislocated shoulder    right   GERD (gastroesophageal reflux disease)    Headache    migraines (about once a month)   History of kidney stones    Hypertension    Neuropathy    Peripheral vascular disease ([place])    Pneumonia    Stroke [facility])    Past Surgical History:  Procedure Laterality Date   ADENOIDECTOMY     APPENDECTOMY     CERVICAL SPINE SURGERY  06/2012   C5-C6   EYE SURGERY     left eye surgery    FOOT SURGERY     KENALOG INJECTION Left 03/10/2021   Procedure: Intravitreal Avastin Injection;  Surgeon: Sherlynn Stalls, MD;  Location: [place];  Service: Ophthalmology;  Laterality: Left;   LOOP RECORDER INSERTION N/A 08/27/2017   Procedure: LOOP RECORDER INSERTION;  Surgeon: Sanda Klein, MD;  Location: [place] [facility];  Service: Cardiovascular;  Laterality: N/A;   MEMBRANE PEEL Left 03/10/2021   Procedure: MEMBRANE PEEL;  Surgeon: Sherlynn Stalls, MD;  Location: [place];  Service: Ophthalmology;  Laterality: Left;   PARS PLANA VITRECTOMY Left 03/10/2021   Procedure: TWENTY-FIVE GAUGE PARS  PLANA VITRECTOMY;  Surgeon: Sherlynn Stalls, MD;  Location: [place];  Service: Ophthalmology;  Laterality: Left;   PHOTOCOAGULATION WITH LASER Left 03/10/2021   Procedure: PHOTOCOAGULATION WITH LASER;  Surgeon: Sherlynn Stalls, MD;  Location: [place];  Service: Ophthalmology;  Laterality: Left;   SHOULDER SURGERY     TEE WITHOUT CARDIOVERSION N/A 08/27/2017   Procedure: TRANSESOPHAGEAL ECHOCARDIOGRAM (TEE);  Surgeon: Sanda Klein, MD;  Location: [facility] ENDOSCOPY;  Service: Cardiovascular;  Laterality: N/A;   TONSILLECTOMY     Patient Active Problem List   Diagnosis Date Noted   AKI (acute kidney injury) ([place]) 49/17/9150   Acute embolic stroke ([place]) 56/97/9480   Hypernatremia 12/20/2021   Chronic kidney disease (CKD), stage IV (severe) ([place]) 12/19/2021   Left-sided weakness and visual deficits  12/19/2021   Vision abnormalities 12/19/2021   Stroke ([place]) 11/10/2021   Depression 07/05/2020   CVA (cerebral vascular accident) ([place]) 06/21/2020   CVA (cerebrovascular accident) ([place]) 06/19/2020   Generalized anxiety disorder 03/01/2019   Abnormality of gait 10/14/2018   Dizziness and giddiness 09/13/2018   Debility 06/26/2018   Chronic nonintractable headache 16/55/3748   Diastolic dysfunction    Congestion of nasal sinus    Pneumonia of left lower lobe  due to infectious organism    SOB (shortness of breath)    Candidiasis    Anemia    Hypoalbuminemia due to protein-calorie malnutrition (HCC)    Hypertension    Diabetes mellitus ([place])    PRES (posterior reversible encephalopathy syndrome)    Hypokalemia    History of CVA with residual deficit    History of seizure 03/25/2018   Bilateral carpal tunnel syndrome 02/14/2018   Arthritis 10/31/2017   Asthma 10/31/2017   Constipation 10/31/2017   Kidney stone 10/31/2017   Anemia, chronic disease 08/09/2017   Hyperlipidemia 07/26/2017   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[place]) 07/25/2017   Anxiety as acute reaction to exceptional stress 04/23/2017   Insomnia  10/08/2016   Neuropathy 06/07/2016   Gait instability    Lesion of pons    Slurred speech    Dizziness 04/30/2016   Hyperglycemia 04/30/2016   Chronic kidney disease (CKD), stage III (moderate) ([place]) 04/30/2016   Ataxia 04/30/2016   Chronic right shoulder pain 04/03/2016   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   Rhinitis, allergic 08/25/2015   Asthma, mild intermittent 07/14/2015   Gastroesophageal reflux disease without esophagitis 06/08/2015   Hypertensive cardiovascular disease 05/05/2015   Vitamin D deficiency 05/05/2015    REFERRING DIAG:  I63.9 (HDQ-22-WL) - Acute embolic stroke (HCC)  N98.9 (ICD-10-CM) - Left-sided weakness    THERAPY DIAG:  Muscle weakness (generalized)  Other abnormalities of gait and mobility  Other lack of coordination  Rationale for Evaluation and Treatment Rehabilitation  PERTINENT HISTORY: multiple prior strokes     PRECAUTIONS: fall  SUBJECTIVE: Patient reports doing well. Walking around house with SPC, no issues. Still with neck and R thigh/knee pain. Discussed with PCP and plan to get lumbar xrays and referral to ortho(?)   PAIN:  Are you having pain? Yes: NPRS scale: 8/10 Pain location: chronic pain at back of head and shoulders/neck   VITALS:  BP: 139/80 HR 80   TODAY'S TREATMENT:    Gait:   -230' SPC + supervision   -weaving thru cones with SPC + light CGA   -ambulatory toe tapping to cones with SPC + CGA (intermittent LOB with stepping strategy utilized appropriately)    -lateral stepping toe tapping to cones with SPC + CGA    Therex:    -anterior ball roll out for low back spasms    -scifit hills level 2 x10 mins   PATIENT EDUCATION: Education details: continue HEP, continue with SPC at home Person educated: Patient Education method: Explanation Education comprehension: verbalized understanding     HOME EXERCISE PROGRAM: Access Code: [email or web address] URL: [email or web address] Date:  01/20/2022 Prepared by: Estevan Ryder  Exercises - Supine Bridge  - 1 x daily - 7 x weekly - 3 sets - 10 reps - Supine Lower Trunk Rotation  - 1 x daily - 7 x weekly - 3 sets - 10 reps - Sit to Stand Without Arm Support  - 1 x daily - 7 x weekly - 3 sets - 10 reps - Standing Hip Abduction with Counter Support  - 1 x daily - 7 x weekly - 3 sets - 10 reps - Standing Hip Extension with Counter Support  - 1 x daily - 7 x weekly - 3 sets - 10 reps - Standing March with Counter Support  - 1 x daily - 7 x weekly - 3 sets - 10 reps - Mini Squat with Counter Support  - 1 x daily - 7 x weekly - 3 sets - 10 reps  GOALS: Goals reviewed with patient? Yes   SHORT TERM GOALS: Target date: 02/03/2022   Pt will be independent with initial HEP for improved ambulatory balance and functional strength  Baseline: to be provided  Goal status: MET   2.  Pt will improve FGA to >/= 11/30 to demonstrate improved balance and reduced fall risk  Baseline: 6/30, 14/30 (8/25) Goal status: MET   3.  Pt will improve gait speed to >/= .48ms to demonstrate improved community ambulation  Baseline: .331m, .6478m(8/25) Goal status: MET   4.  Patient will improve BBS score to >/=52/56 Baseline: 44/56, 45/56 (8/25) Goal status: IN PROGRESS       LONG TERM GOALS: Target date: 02/24/2022   Pt will be independent with final HEP for improved ambulatory balance and functional strength   Baseline: to be provided Goal status: INITIAL   2.  Pt will improve FGA to >/= 16/30 to demonstrate improved balance and reduced fall risk Baseline: 6/30, 14/30 Goal status: INITIAL   3.  Pt will improve gait speed to >/= .59m/94mo demonstrate improved community ambulation   Baseline: .359m/159m39m/s54ml status: MET, GOAL UPGRADED        ASSESSMENT:   CLINICAL IMPRESSION: Patient seen for skilled PT session with emphasis on ambulatory balance retraining. Tolerating [facility] reports that she's  comfortable using it in the home, but not yet in the community. Discussed possible longer term need for RW use on the community due to endurance challenges. She does have some back spasms with prolonged ambulation or standing as well. Continue POC.     OBJECTIVE IMPAIRMENTS Abnormal gait, decreased activity tolerance, decreased balance, decreased endurance, decreased knowledge of condition, difficulty walking, decreased strength, and pain.    ACTIVITY LIMITATIONS carrying, lifting, bending, standing, squatting, stairs, transfers, locomotion level, and caring for others   PARTICIPATION LIMITATIONS: meal prep, cleaning, driving, shopping, and community activity   PERSONAL FACTORS Behavior pattern, Past/current experiences, Transportation, and 1-2 comorbidities: HTN, multiple prior CVAs  are also affecting patient's functional outcome.    REHAB POTENTIAL: Good   CLINICAL DECISION MAKING: Stable/uncomplicated   EVALUATION COMPLEXITY: Low   PLAN: PT FREQUENCY: 1x/week per patient request   PT DURATION: 6 weeks   PLANNED INTERVENTIONS: Therapeutic exercises, Therapeutic activity, Neuromuscular re-education, Balance training, Gait training, Patient/Family education, Self Care, Joint mobilization, Stair training, Vestibular training, Visual/preceptual remediation/compensation, Orthotic/Fit training, DME instructions, Aquatic Therapy, Cognitive remediation, Electrical stimulation, Cryotherapy, Moist heat, Taping, Manual therapy, and Re-evaluation   PLAN FOR NEXT SESSION: trial SPC across various surfaces/terrain, high level balance, endurance, dot and ladder drills  JennifDebbora DusennifDebbora DusDPT, CBIS 02/17/2022, 10:17 AM

## 2022-02-24 ENCOUNTER — Ambulatory Visit: Payer: Medicare Other

## 2022-02-24 DIAGNOSIS — M6281 Muscle weakness (generalized): Secondary | ICD-10-CM | POA: Diagnosis not present

## 2022-02-24 DIAGNOSIS — R2689 Other abnormalities of gait and mobility: Secondary | ICD-10-CM

## 2022-02-24 DIAGNOSIS — R278 Other lack of coordination: Secondary | ICD-10-CM

## 2022-02-24 DIAGNOSIS — R29818 Other symptoms and signs involving the nervous system: Secondary | ICD-10-CM

## 2022-02-24 NOTE — Therapy (Signed)
OUTPATIENT PHYSICAL THERAPY TREATMENT NOTE/DISCHARGE SUMMARY    Patient Name: Christina Orozco MRN: [id number] DOB:[birth year], 51 y.o., female Today's Date: 02/24/2022  PCP: Jenel Lucks, PA-C REFERRING PROVIDER: Reesa Chew, PA-C  PHYSICAL THERAPY DISCHARGE SUMMARY  Visits from Start of Care: 5  Current functional level related to goals / functional outcomes: ModI using SPC or RW. Met 3/3 LTG   Remaining deficits: Intermittent  BLE weakness and resultant limited balance strategies, decreased endurance   Education / Equipment: PT POC, HEP, use of RW vs SPC, outcome measure results   Patient agrees to discharge. Patient goals were met. Patient is being discharged due to meeting the stated rehab goals.  END OF SESSION:   PT End of Session - 02/24/22 0932     Visit Number 5    Number of Visits 7    Date for PT Re-Evaluation 02/24/22    Authorization Type MCD healthy blue    PT Start Time 0930    PT Stop Time 1002   discharge   PT Time Calculation (min) 32 min    Equipment Utilized During Treatment Gait belt    Activity Tolerance Patient tolerated treatment well    Behavior During Therapy WFL for tasks assessed/performed              Past Medical History:  Diagnosis Date   Anemia    severe   Anxiety    Asthma    CKD (chronic kidney disease)    stage IV   DDD (degenerative disc disease), cervical    Depression    Diabetes mellitus (HCC)    Dislocated shoulder    right   GERD (gastroesophageal reflux disease)    Headache    migraines (about once a month)   History of kidney stones    Hypertension    Neuropathy    Peripheral vascular disease ([place])    Pneumonia    Stroke [facility])    Past Surgical History:  Procedure Laterality Date   ADENOIDECTOMY     APPENDECTOMY     CERVICAL SPINE SURGERY  06/2012   C5-C6   EYE SURGERY     left eye surgery    FOOT SURGERY     KENALOG INJECTION Left 03/10/2021   Procedure: Intravitreal Avastin  Injection;  Surgeon: Sherlynn Stalls, MD;  Location: [place];  Service: Ophthalmology;  Laterality: Left;   LOOP RECORDER INSERTION N/A 08/27/2017   Procedure: LOOP RECORDER INSERTION;  Surgeon: Sanda Klein, MD;  Location: [place] [facility];  Service: Cardiovascular;  Laterality: N/A;   MEMBRANE PEEL Left 03/10/2021   Procedure: MEMBRANE PEEL;  Surgeon: Sherlynn Stalls, MD;  Location: [place];  Service: Ophthalmology;  Laterality: Left;   PARS PLANA VITRECTOMY Left 03/10/2021   Procedure: TWENTY-FIVE GAUGE PARS PLANA VITRECTOMY;  Surgeon: Sherlynn Stalls, MD;  Location: [place];  Service: Ophthalmology;  Laterality: Left;   PHOTOCOAGULATION WITH LASER Left 03/10/2021   Procedure: PHOTOCOAGULATION WITH LASER;  Surgeon: Sherlynn Stalls, MD;  Location: [place];  Service: Ophthalmology;  Laterality: Left;   SHOULDER SURGERY     TEE WITHOUT CARDIOVERSION N/A 08/27/2017   Procedure: TRANSESOPHAGEAL ECHOCARDIOGRAM (TEE);  Surgeon: Sanda Klein, MD;  Location: [facility] ENDOSCOPY;  Service: Cardiovascular;  Laterality: N/A;   TONSILLECTOMY     Patient Active Problem List   Diagnosis Date Noted   AKI (acute kidney injury) ([place]) 25/42/7062   Acute embolic stroke ([place]) 37/62/8315   Hypernatremia 12/20/2021   Chronic kidney disease (CKD), stage IV (severe) ([place]) 12/19/2021  Left-sided weakness and visual deficits  12/19/2021   Vision abnormalities 12/19/2021   Stroke ([place]) 11/10/2021   Depression 07/05/2020   CVA (cerebral vascular accident) ([place]) 06/21/2020   CVA (cerebrovascular accident) ([place]) 06/19/2020   Generalized anxiety disorder 03/01/2019   Abnormality of gait 10/14/2018   Dizziness and giddiness 09/13/2018   Debility 06/26/2018   Chronic nonintractable headache 87/86/7672   Diastolic dysfunction    Congestion of nasal sinus    Pneumonia of left lower lobe due to infectious organism    SOB (shortness of breath)    Candidiasis    Anemia    Hypoalbuminemia due to protein-calorie malnutrition ([place])     Hypertension    Diabetes mellitus ([place])    PRES (posterior reversible encephalopathy syndrome)    Hypokalemia    History of CVA with residual deficit    History of seizure 03/25/2018   Bilateral carpal tunnel syndrome 02/14/2018   Arthritis 10/31/2017   Asthma 10/31/2017   Constipation 10/31/2017   Kidney stone 10/31/2017   Anemia, chronic disease 08/09/2017   Hyperlipidemia 07/26/2017   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[place]) 07/25/2017   Anxiety as acute reaction to exceptional stress 04/23/2017   Insomnia 10/08/2016   Neuropathy 06/07/2016   Gait instability    Lesion of pons    Slurred speech    Dizziness 04/30/2016   Hyperglycemia 04/30/2016   Chronic kidney disease (CKD), stage III (moderate) ([place]) 04/30/2016   Ataxia 04/30/2016   Chronic right shoulder pain 04/03/2016   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   Rhinitis, allergic 08/25/2015   Asthma, mild intermittent 07/14/2015   Gastroesophageal reflux disease without esophagitis 06/08/2015   Hypertensive cardiovascular disease 05/05/2015   Vitamin D deficiency 05/05/2015    REFERRING DIAG:  I63.9 (CNO-70-JG) - Acute embolic stroke (HCC)  G83.6 (ICD-10-CM) - Left-sided weakness    THERAPY DIAG:  Muscle weakness (generalized)  Other abnormalities of gait and mobility  Other lack of coordination  Other symptoms and signs involving the nervous system  Rationale for Evaluation and Treatment Rehabilitation  PERTINENT HISTORY: multiple prior strokes     PRECAUTIONS: fall  SUBJECTIVE: Patient reports doing well. Patient does report getting stuck in the back seat of her car looking for something and it took ~45 mins to get out by herself. Reports that there was glass on the floor from a window break that did cut her, but small cuts are CDI. Is getting acupuncture for her overactive bladder and that's helping thus far.   PAIN:  Are you having pain? Yes: NPRS scale: 5/10 Pain location: chronic pain at back of head and  shoulders/neck    TODAY'S TREATMENT:     [facility] PT Assessment - 02/24/22 0001       Standardized Balance Assessment   10 Meter Walk 1.34ms      Functional Gait  Assessment   Gait assessed  Yes    Gait Level Surface Walks 20 ft in less than 7 sec but greater than 5.5 sec, uses assistive device, slower speed, mild gait deviations, or deviates 6-10 in outside of the 12 in walkway width.    Change in Gait Speed Able to smoothly change walking speed without loss of balance or gait deviation. Deviate no more than 6 in outside of the 12 in walkway width.    Gait with Horizontal Head Turns Performs head turns with moderate changes in gait velocity, slows down, deviates 10-15 in outside 12 in walkway width but recovers, can continue to walk.    Gait  with Vertical Head Turns Performs task with slight change in gait velocity (eg, minor disruption to smooth gait path), deviates 6 - 10 in outside 12 in walkway width or uses assistive device    Gait and Pivot Turn Pivot turns safely in greater than 3 sec and stops with no loss of balance, or pivot turns safely within 3 sec and stops with mild imbalance, requires small steps to catch balance.    Step Over Obstacle Is able to step over one shoe box (4.5 in total height) without changing gait speed. No evidence of imbalance.    Gait with Narrow Base of Support Is able to ambulate for 10 steps heel to toe with no staggering.    Gait with Eyes Closed Walks 20 ft, uses assistive device, slower speed, mild gait deviations, deviates 6-10 in outside 12 in walkway width. Ambulates 20 ft in less than 9 sec but greater than 7 sec.    Ambulating Backwards Walks 20 ft, uses assistive device, slower speed, mild gait deviations, deviates 6-10 in outside 12 in walkway width.    Steps Cannot do safely.   patient refused   Total Score 19               PATIENT EDUCATION: Education details: continue HEP, continue with SPC at home, wean to [facility] in community, PT POC,  outcome measure results Person educated: Patient Education method: Explanation Education comprehension: verbalized understanding     HOME EXERCISE PROGRAM: Access Code: [email or web address] URL: [email or web address] Date: 01/20/2022 Prepared by: Estevan Ryder  Exercises - Supine Bridge  - 1 x daily - 7 x weekly - 3 sets - 10 reps - Supine Lower Trunk Rotation  - 1 x daily - 7 x weekly - 3 sets - 10 reps - Sit to Stand Without Arm Support  - 1 x daily - 7 x weekly - 3 sets - 10 reps - Standing Hip Abduction with Counter Support  - 1 x daily - 7 x weekly - 3 sets - 10 reps - Standing Hip Extension with Counter Support  - 1 x daily - 7 x weekly - 3 sets - 10 reps - Standing March with Counter Support  - 1 x daily - 7 x weekly - 3 sets - 10 reps - Mini Squat with Counter Support  - 1 x daily - 7 x weekly - 3 sets - 10 reps     GOALS: Goals reviewed with patient? Yes   SHORT TERM GOALS: Target date: 02/03/2022   Pt will be independent with initial HEP for improved ambulatory balance and functional strength  Baseline: to be provided  Goal status: MET   2.  Pt will improve FGA to >/= 11/30 to demonstrate improved balance and reduced fall risk  Baseline: 6/30, 14/30 (8/25) Goal status: MET   3.  Pt will improve gait speed to >/= .86ms to demonstrate improved community ambulation  Baseline: .347m, .6459m(8/25) Goal status: MET   4.  Patient will improve BBS score to >/=52/56 Baseline: 44/56, 45/56 (8/25) Goal status: IN PROGRESS       LONG TERM GOALS: Target date: 02/24/2022   Pt will be independent with final HEP for improved ambulatory balance and functional strength   Baseline: to be provided; provided Goal status: MET   2.  Pt will improve FGA to >/= 16/30 to demonstrate improved balance and reduced fall risk Baseline: 6/30, 14/30; 19/30 Goal status: MET   3.  Pt will improve gait speed to >/= .  84ms to demonstrate improved community ambulation   Baseline:  .326m, .6463m 1.2m51mGoal status: MET       ASSESSMENT:   CLINICAL IMPRESSION: Patient seen for skilled PT session with emphasis on goal assessment and d/c. Patient demonstrates increased fall risk as noted by score of 19/30 on  Functional Gait Assessment.   <22/30 = predictive of falls, <20/30 = fall in 6 months, <18/30 = predictive of falls in PD MCID: 5 points stroke population, 4 points geriatric population (ANPTA Core Set of Outcome Measures for Adults with Neurologic Conditions, 2018). 10 Meter Walk Test: Patient instructed to walk 10 meters (32.8 ft) as quickly and as safely as possible at their normal speed x2 and at a fast speed x2. Time measured from 2 meter mark to 8 meter mark to accommodate ramp-up and ramp-down.  Normal speed: 1.2m/89mt off scores: <0.4 m/s = household Ambulator, 0.4-0.8 m/s = limited community Ambulator, >0.8 m/s = community Ambulator, >1.2 m/s = crossing a street, <1.0 = increased fall risk MCID 0.05 m/s (small), 0.13 m/s (moderate), 0.06 m/s (significant)  (ANPTA Core Set of Outcome Measures for Adults with Neurologic Conditions, 2018). Discussed with patient weaning to [facility] the community as tolerated. Educating patient on being mindful of energy levels and LE strength that day. Patient verbalized understanding. Agreeable to dc from PT at this time.       OBJECTIVE IMPAIRMENTS Abnormal gait, decreased activity tolerance, decreased balance, decreased endurance, decreased knowledge of condition, difficulty walking, decreased strength, and pain.    ACTIVITY LIMITATIONS carrying, lifting, bending, standing, squatting, stairs, transfers, locomotion level, and caring for others   PARTICIPATION LIMITATIONS: meal prep, cleaning, driving, shopping, and community activity   PERSONAL FACTORS Behavior pattern, Past/current experiences, Transportation, and 1-2 comorbidities: HTN, multiple prior CVAs  are also affecting patient's functional outcome.     REHAB POTENTIAL: Good   CLINICAL DECISION MAKING: Stable/uncomplicated   EVALUATION COMPLEXITY: Low   PLAN: PT FREQUENCY: 1x/week per patient request   PT DURATION: 6 weeks   PLANNED INTERVENTIONS: Therapeutic exercises, Therapeutic activity, Neuromuscular re-education, Balance training, Gait training, Patient/Family education, Self Care, Joint mobilization, Stair training, Vestibular training, Visual/preceptual remediation/compensation, Orthotic/Fit training, DME instructions, Aquatic Therapy, Cognitive remediation, Electrical stimulation, Cryotherapy, Moist heat, Taping, Manual therapy, and Re-evaluation   PLAN FOR NEXT SESSION: d/c from PT  JLa AlianzaJenniDebbora Dus DPT, CBIS 02/24/2022, 10:08 AM

## 2022-03-02 ENCOUNTER — Encounter: Payer: Self-pay | Admitting: Physical Medicine & Rehabilitation

## 2022-03-02 ENCOUNTER — Encounter: Payer: Medicare Other | Attending: Registered Nurse | Admitting: Physical Medicine & Rehabilitation

## 2022-03-02 VITALS — BP 185/99 | HR 80 | Ht 62.0 in | Wt 137.6 lb

## 2022-03-02 DIAGNOSIS — R202 Paresthesia of skin: Secondary | ICD-10-CM | POA: Insufficient documentation

## 2022-03-02 DIAGNOSIS — R269 Unspecified abnormalities of gait and mobility: Secondary | ICD-10-CM | POA: Insufficient documentation

## 2022-03-02 DIAGNOSIS — I69398 Other sequelae of cerebral infarction: Secondary | ICD-10-CM | POA: Insufficient documentation

## 2022-03-02 DIAGNOSIS — R200 Anesthesia of skin: Secondary | ICD-10-CM | POA: Insufficient documentation

## 2022-03-02 NOTE — Progress Notes (Signed)
Subjective:    Patient ID: Christina Orozco, female    DOB: 03-13-1971, 51 y.o.   MRN: [id number]  HPI 51 y.o. female with history of multiple prior strokes, CKD, anxiety/depression, T2DM who presented to the emergency department with left arm and leg weakness, decreased visual acuity dizziness and headache. On 12/18/21  Imaging was consistent with right frontal centrum semiovale and small left pontine infarcts.  MRI/MRA brain ordered for work-up however patient left without being seen.  She presented to [place] on 07/10 with left-sided weakness and visual deficits and MRI/MRA brain done revealing 1.9 cm acute ischemic nonhemorrhagic infarct in the deep white matter right frontal centrum ovale and advanced chronic microvascular disease.     Dr. Erlinda Hong recommended aspirin and Brilinta- P2y12 level 103 and stroke felt to be likely due to small vessel disease.  He question vasculitis as well as consideration of outpatient LP with holding of Brilinta however felt that this would likely have low yield.  Renal ultrasound done due to acute on chronic renal failure and showed increased cortical echogenicity compatible with medical renal disease.  Therapy was working and patient was limited by dizziness, decreased visual acuity, vestibular impairments as well as hemiplegia with ataxia.  CIR was recommended due to functional decline.  Admit date: 12/23/2021 Discharge date: 12/29/21   Pt discharged to home after stroke rehab inpt stay Has aide 3h per day, mainly for housekeeping  No falls  Low back with RLE pain seen by ortho yesterday and received steroid injection Graduated from PT, continues with OT Amb with cane Mod I dressing, needs assist with bathing  Still feels off balance  Se[facility] neurology [facility]   Primary concurrent plaint is left fourth and fifth digit numbness in the upper extremity.  She denies any injury to the elbow.  This has occurred since the stroke but has not improved.   She feels like she is made some good progress coming off the walker and just using a cane. The patient does not have pain in the hand unless she bends her elbow greater than 90 degrees.  She does not have any wrist pain. The patient states that she has had EMG/NCV in the past looking for carpal tunnel but she does not remember exactly when. Pain Inventory Average Pain 5 Pain Right Now 4 My pain is  na  LOCATION OF PAIN  head neck and knee and back  BOWEL Number of stools per week: 1   BLADDER Normal   Mobility walk with assistance use a cane ability to climb steps?  no do you drive?  no  Function disabled: date disabled .  Neuro/Psych trouble walking depression anxiety  Prior Studies Any changes since last visit?  no  Physicians involved in your care Any changes since last visit?  no   Family History  Problem Relation Age of Onset   Vascular Disease Mother    CAD Mother    Heart failure Mother    Heart disease Other    Cancer Other        colon, stomach, pancreatic, lung   Diabetes Other    Breast cancer Sister 30   Seizures Maternal Uncle    Social History   Socioeconomic History   Marital status: Single    Spouse name: Not on file   Number of children: 3   Years of education: 12+   Highest education level: Some college, no degree  Occupational History   Not on file  Tobacco Use   Smoking status: Never   Smokeless tobacco: Never  Vaping Use   Vaping Use: Never used  Substance and Sexual Activity   Alcohol use: No   Drug use: No   Sexual activity: Not Currently  Other Topics Concern   Not on file  Social History Narrative   Lives with daughter   Caffeine use: "once in a blue moon"   Right handed   Social Determinants of Health   Financial Resource Strain: Not on file  Food Insecurity: Not on file  Transportation Needs: Not on file  Physical Activity: Not on file  Stress: Not on file  Social Connections: Not on file   Past Surgical  History:  Procedure Laterality Date   [place]  06/2012   C5-C6   EYE SURGERY     left eye surgery    FOOT SURGERY     KENALOG INJECTION Left 03/10/2021   Procedure: Intravitreal Avastin Injection;  Surgeon: Sherlynn Stalls, MD;  Location: [place];  Service: Ophthalmology;  Laterality: Left;   LOOP RECORDER INSERTION N/A 08/27/2017   Procedure: LOOP RECORDER INSERTION;  Surgeon: Sanda Klein, MD;  Location: [place] [facility];  Service: Cardiovascular;  Laterality: N/A;   MEMBRANE PEEL Left 03/10/2021   Procedure: MEMBRANE PEEL;  Surgeon: Sherlynn Stalls, MD;  Location: [place];  Service: Ophthalmology;  Laterality: Left;   PARS PLANA VITRECTOMY Left 03/10/2021   Procedure: TWENTY-FIVE GAUGE PARS PLANA VITRECTOMY;  Surgeon: Sherlynn Stalls, MD;  Location: [place];  Service: Ophthalmology;  Laterality: Left;   PHOTOCOAGULATION WITH LASER Left 03/10/2021   Procedure: PHOTOCOAGULATION WITH LASER;  Surgeon: Sherlynn Stalls, MD;  Location: [place];  Service: Ophthalmology;  Laterality: Left;   SHOULDER SURGERY     TEE WITHOUT CARDIOVERSION N/A 08/27/2017   Procedure: TRANSESOPHAGEAL ECHOCARDIOGRAM (TEE);  Surgeon: Sanda Klein, MD;  Location: [facility];  Service: Cardiovascular;  Laterality: N/A;   TONSILLECTOMY     Past Medical History:  Diagnosis Date   Anemia    severe   Anxiety    Asthma    CKD (chronic kidney disease)    stage IV   DDD (degenerative disc disease), cervical    Depression    Diabetes mellitus ([place])    Dislocated shoulder    right   GERD (gastroesophageal reflux disease)    Headache    migraines (about once a month)   History of kidney stones    Hypertension    Neuropathy    Peripheral vascular disease (HCC)    Pneumonia    Stroke (HCC)    BP (!) 185/99 Comment: has not had medication this am yet  Pulse 80   Ht 5\' 2"  (1.575 m)   Wt 137 lb 9.6 oz (62.4 kg)   LMP 05/22/2018 (Within Days)   SpO2 98%   BMI 25.17  kg/m   Opioid Risk Score:   Fall Risk Score:  `1  Depression screen [facility] 2/9     03/02/2022   10:32 AM 01/17/2022   11:17 AM 07/05/2020    1:27 PM 09/13/2018   11:41 AM 07/15/2018   11:04 AM 05/23/2018    9:07 AM 04/26/2018   10:58 AM  Depression screen PHQ 2/9  Decreased Interest  1 3 1  0 2 1  Down, Depressed, Hopeless  2 3 1 1 2 2   PHQ - 2 Score  3 6 2 1 4 3   Altered sleeping  1 1 1   2 2   Tired, decreased energy 2 2 1   2 2   Change in appetite  2 1   1 1   Feeling bad or failure about yourself   3 2   2 3   Trouble concentrating  2 0   0 0  Moving slowly or fidgety/restless  1 0   0 0  Suicidal thoughts  0 0   0 0  PHQ-9 Score  14 11   11 11   Difficult doing work/chores  Somewhat difficult     Somewhat difficult     Review of Systems  Constitutional:  Positive for appetite change, chills, diaphoresis and unexpected weight change.       Poor appetite but on ozempic  HENT: Negative.    Eyes: Negative.   Respiratory: Negative.    Cardiovascular: Negative.   Gastrointestinal:  Positive for constipation.  Endocrine: Negative.   Genitourinary: Negative.   Musculoskeletal:  Positive for gait problem.  Skin: Negative.   Allergic/Immunologic: Negative.   Hematological:  Bruises/bleeds easily.       Brilinta  Psychiatric/Behavioral:  Positive for dysphoric mood. The patient is nervous/anxious.   All other systems reviewed and are negative.      Objective:   Physical Exam Mild hand intrinsic wasting left upper extremity. Motor strength is 4/5 in the left deltoid by stress of grip 5/5 left hip flexor knee extensor ankle dorsiflexion 5/5 on the right side in the upper and lower limbs Sensation reduced pinprick and light touch in the left fourth and fifth digits Negative Tinel's at the elbow or wrist Ambulates with a cane no evidence of toe drag or knee instability.  Foraminal compression test is only local pain at the left side of the neck.     Assessment & Plan:  1.   History of right CVA with left hemiparesis.  We discussed that while her left upper extremity numbness may be related to her stroke she does have some physical exam findings which suggest peripheral neuropathy affecting the ulnar nerve.  She also has a history of neck pain but no clear-cut cervical radicular symptoms.  We discussed that an EMG/NCV would be helpful for further evaluation.  She feels like her symptoms are severe enough to warrant further work-up. She would like to have this done at this office.  We will schedule next month.

## 2022-03-03 ENCOUNTER — Encounter: Payer: Self-pay | Admitting: Rehabilitative and Restorative Service Providers"

## 2022-03-03 ENCOUNTER — Ambulatory Visit: Payer: Medicare Other | Admitting: Rehabilitative and Restorative Service Providers"

## 2022-03-03 DIAGNOSIS — M6281 Muscle weakness (generalized): Secondary | ICD-10-CM | POA: Diagnosis not present

## 2022-03-03 DIAGNOSIS — M25611 Stiffness of right shoulder, not elsewhere classified: Secondary | ICD-10-CM

## 2022-03-03 DIAGNOSIS — R531 Weakness: Secondary | ICD-10-CM

## 2022-03-03 DIAGNOSIS — G8929 Other chronic pain: Secondary | ICD-10-CM

## 2022-03-03 DIAGNOSIS — R278 Other lack of coordination: Secondary | ICD-10-CM

## 2022-03-03 NOTE — Therapy (Signed)
OUTPATIENT OCCUPATIONAL THERAPY NEURO TREATMENT  Patient Name: Christina Orozco MRN: [id number] DOB:February 26, 1971, 51 y.o., female Today's Date: 03/03/2022  PCP: Dr. Norton Pastel PROVIDER: Dr. Letta Pate   OT End of Session - 03/03/22 0948     Visit Number 3    Number of Visits 12    Authorization Type Medicare    Authorization Time Period POC written for 12 weeks, anticipate d/c after 4-6 weeks    Authorization - Visit Number 2    Progress Note Due on Visit 10    OT Start Time 0948    OT Stop Time 1016    OT Time Calculation (min) 28 min    Activity Tolerance Patient tolerated treatment well;No increased pain;Patient limited by pain;Patient limited by fatigue    Behavior During Therapy [facility] for tasks assessed/performed;Flat affect               Past Medical History:  Diagnosis Date   Anemia    severe   Anxiety    Asthma    CKD (chronic kidney disease)    stage IV   DDD (degenerative disc disease), cervical    Depression    Diabetes mellitus (HCC)    Dislocated shoulder    right   GERD (gastroesophageal reflux disease)    Headache    migraines (about once a month)   History of kidney stones    Hypertension    Neuropathy    Peripheral vascular disease ([place])    Pneumonia    Stroke [facility])    Past Surgical History:  Procedure Laterality Date   ADENOIDECTOMY     APPENDECTOMY     CERVICAL SPINE SURGERY  06/2012   C5-C6   EYE SURGERY     left eye surgery    FOOT SURGERY     KENALOG INJECTION Left 03/10/2021   Procedure: Intravitreal Avastin Injection;  Surgeon: Sherlynn Stalls, MD;  Location: [place];  Service: Ophthalmology;  Laterality: Left;   LOOP RECORDER INSERTION N/A 08/27/2017   Procedure: LOOP RECORDER INSERTION;  Surgeon: Sanda Klein, MD;  Location: [place] [facility];  Service: Cardiovascular;  Laterality: N/A;   MEMBRANE PEEL Left 03/10/2021   Procedure: MEMBRANE PEEL;  Surgeon: Sherlynn Stalls, MD;  Location: [place];  Service: Ophthalmology;   Laterality: Left;   PARS PLANA VITRECTOMY Left 03/10/2021   Procedure: TWENTY-FIVE GAUGE PARS PLANA VITRECTOMY;  Surgeon: Sherlynn Stalls, MD;  Location: [place];  Service: Ophthalmology;  Laterality: Left;   PHOTOCOAGULATION WITH LASER Left 03/10/2021   Procedure: PHOTOCOAGULATION WITH LASER;  Surgeon: Sherlynn Stalls, MD;  Location: [place];  Service: Ophthalmology;  Laterality: Left;   SHOULDER SURGERY     TEE WITHOUT CARDIOVERSION N/A 08/27/2017   Procedure: TRANSESOPHAGEAL ECHOCARDIOGRAM (TEE);  Surgeon: Sanda Klein, MD;  Location: [facility];  Service: Cardiovascular;  Laterality: N/A;   TONSILLECTOMY     Patient Active Problem List   Diagnosis Date Noted   Numbness and tingling in left hand 03/02/2022   Gait disturbance, post-stroke 03/02/2022   AKI (acute kidney injury) ([place]) 94/17/4081   Acute embolic stroke ([place]) 44/81/8563   Hypernatremia 12/20/2021   Chronic kidney disease (CKD), stage IV (severe) ([place]) 12/19/2021   Left-sided weakness and visual deficits  12/19/2021   Vision abnormalities 12/19/2021   Stroke ([place]) 11/10/2021   Depression 07/05/2020   CVA (cerebral vascular accident) ([place]) 06/21/2020   CVA (cerebrovascular accident) ([place]) 06/19/2020   Generalized anxiety disorder 03/01/2019   Abnormality of gait 10/14/2018   Dizziness and giddiness  09/13/2018   Debility 06/26/2018   Chronic nonintractable headache 35/36/1443   Diastolic dysfunction    Congestion of nasal sinus    Pneumonia of left lower lobe due to infectious organism    SOB (shortness of breath)    Candidiasis    Anemia    Hypoalbuminemia due to protein-calorie malnutrition (HCC)    Hypertension    Diabetes mellitus ([place])    PRES (posterior reversible encephalopathy syndrome)    Hypokalemia    History of CVA with residual deficit    History of seizure 03/25/2018   Bilateral carpal tunnel syndrome 02/14/2018   Arthritis 10/31/2017   Asthma 10/31/2017   Constipation 10/31/2017   Kidney stone  10/31/2017   Anemia, chronic disease 08/09/2017   Hyperlipidemia 07/26/2017   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[place]) 07/25/2017   Anxiety as acute reaction to exceptional stress 04/23/2017   Insomnia 10/08/2016   Neuropathy 06/07/2016   Gait instability    Lesion of pons    Slurred speech    Dizziness 04/30/2016   Hyperglycemia 04/30/2016   Chronic kidney disease (CKD), stage III (moderate) ([place]) 04/30/2016   Ataxia 04/30/2016   Chronic right shoulder pain 04/03/2016   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   Rhinitis, allergic 08/25/2015   Asthma, mild intermittent 07/14/2015   Gastroesophageal reflux disease without esophagitis 06/08/2015   Hypertensive cardiovascular disease 05/05/2015   Vitamin D deficiency 05/05/2015    ONSET DATE: 12/18/21  REFERRING DIAG:  I63.9 ([id number]) - Acute embolic stroke (HCC)  P61.9 (ICD-10-CM) - Left-sided weakness    THERAPY DIAG:  Muscle weakness (generalized)  Other lack of coordination  Stiffness of right shoulder, not elsewhere classified  Chronic right shoulder pain  Left-sided weakness and visual deficits   Rationale for Evaluation and Treatment Rehabilitation  PERTINENT HISTORY:50 y.o. female with medical history significant of CKD stage IV, asthma, anxiety and depression, T2DM, HTN, GERD, PVD, hx of CVA on DAPT who presented to [facility] ED on 7/9 with complaints of left sided weakness and vision changes.MRI revealing acute ischemia, nonhemorrhagic infarct involving deep white matter of posterior R frontal centrum semi ovale. Prior CVA 6/1, hypertension, hyperlipidemia, diabetes, chronic kidney disease, prior strokes, history of seizures   D/c from rehab 12/29/21 Pt has an aide M-F to assist with dressing   PRECAUTIONS: Other: no driving  WEIGHT BEARING RESTRICTIONS No   SUBJECTIVE:   SUBJECTIVE STATEMENT: She arrives quite late for treatment after ~4 weeks of not being seen. She reports she's here mainly for Lt sh weakness & pain at this  point, but also having numbness in Lt SF, RF today. (States having upcoming EMG)   PAIN:  Are you having pain? Yes: NPRS scale: 5/10 Pain location: L upper trap and shoulder Pain description: aching Aggravating factors: movement, alleviating unknown  FALLS: Has patient fallen in last 6 months? Yes. Number of falls 1  LIVING ENVIRONMENT: Lives with: lives with their family, pt's dtr goes back to college 01/26/22 Lives in: House/apartment Stairs: No Has following equipment at home: Gilford Rile - 2 wheeled and Shower bench  PLOF: Needs assistance with ADLs and Needs assistance with homemaking  PATIENT GOALS to get back to prior level of function. 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 She states understanding.   OT also takes new shoulder measures, assigns stretches for Lt shoulder as below,which she demo's back well, states understanding.   Exercises - Seated Bilateral Shoulder Flexion Towel Slide at Table Top  - 3-4 x daily - 1 sets - 3-5 reps - 15 sec hold - Seated Shoulder Abduction Towel Slide at Table Top with Forearm in Neutral  - 3-5 x daily - 1 sets - 3-5 reps - 15 sec hold - Doorway Stretches (both arms low, lean in gently)   - 3-4 x daily - 3-5 reps - 15 hold   02/03/22: Coordination HEP issued and pt returned demonstration. Supine cane exercises for shoulder flexion and chest press within pain free ROM, min v.c LUE functional reach to place yellow-green graded clothespins on vertical  antennae, min difficulty   PATIENT EDUCATION: Education details: coordination and supine cane exercise Person educated: Patient Education method: Adult nurse, handout Education comprehension: verbalized understanding, returned demonstration   HOME EXERCISE PROGRAM: Access Code: [email or web address] URL: [email or web address] Date: 03/03/2022 Prepared by: Benito Mccreedy    GOALS: Goals reviewed with patient? Yes   LONG TERM GOALS: Target date: 04/07/22  I with HEP Baseline:  Goal status: IN PROGRESS  2. Mod I with basic cooking tasks demonstrating good safety awareness Baseline:  Goal status: INITIAL  3.  I with adapted strategies to maximize safety and I with ADLs/IADLS. Baseline:  Goal status: INITIAL    ASSESSMENT:  CLINICAL IMPRESSION: 03/03/22: She will hopefully do better with ulnar nerve if she listens to precautions/advice.  She has shoulder HEP and needs to be consistent with it.    PLAN: OT FREQUENCY: 1x/week  OT DURATION: 12 weeks due to scheduling, anticipate d/c after 4-6 weeks  PLANNED INTERVENTIONS: self care/ADL training, therapeutic exercise, therapeutic activity, neuromuscular re-education, manual therapy, balance training, functional mobility training, splinting, ultrasound, paraffin, fluidotherapy, moist heat, cryotherapy, patient/family education, cognitive remediation/compensation, visual/perceptual remediation/compensation, energy conservation, coping strategies training, and DME and/or AE instructions  RECOMMENDED OTHER SERVICES: She is getting PT now  CONSULTED AND AGREED WITH PLAN OF CARE: Patient  PLAN FOR NEXT SESSION:   Recommended to assign stretches for tight sh rotation (IR, ER), moving toward RTC strengthening program, as indicated. Also can continue with safety for cooking, IADLs, work towards goals as needed.    Benito Mccreedy, OTR/L, CHT 03/03/2022, 12:59 PM

## 2022-03-10 ENCOUNTER — Ambulatory Visit: Payer: Medicare Other | Admitting: Occupational Therapy

## 2022-03-10 ENCOUNTER — Encounter: Payer: Self-pay | Admitting: Occupational Therapy

## 2022-03-10 DIAGNOSIS — M25611 Stiffness of right shoulder, not elsewhere classified: Secondary | ICD-10-CM

## 2022-03-10 DIAGNOSIS — M6281 Muscle weakness (generalized): Secondary | ICD-10-CM | POA: Diagnosis not present

## 2022-03-10 DIAGNOSIS — R278 Other lack of coordination: Secondary | ICD-10-CM

## 2022-03-10 DIAGNOSIS — R41840 Attention and concentration deficit: Secondary | ICD-10-CM

## 2022-03-10 DIAGNOSIS — R2689 Other abnormalities of gait and mobility: Secondary | ICD-10-CM

## 2022-03-10 DIAGNOSIS — G8929 Other chronic pain: Secondary | ICD-10-CM

## 2022-03-10 DIAGNOSIS — R29818 Other symptoms and signs involving the nervous system: Secondary | ICD-10-CM

## 2022-03-10 DIAGNOSIS — R531 Weakness: Secondary | ICD-10-CM

## 2022-03-10 NOTE — Therapy (Signed)
OUTPATIENT OCCUPATIONAL THERAPY NEURO TREATMENT  Patient Name: Setareh Rom MRN: [id number] DOB:[birth year], 51 y.o., female Today's Date: 03/10/2022  PCP: Dr. Rayburn Go REFERRING PROVIDER: Dr. Letta Pate   OT End of Session - 03/10/22 0950     Visit Number 4    Number of Visits 12    Authorization Type Medicare    Authorization Time Period POC written for 12 weeks, anticipate d/c after 4-6 weeks    Authorization - Visit Number 4    Progress Note Due on Visit 10    OT Start Time 0945    OT Stop Time 1015    OT Time Calculation (min) 30 min    Activity Tolerance Patient tolerated treatment well    Behavior During Therapy WFL for tasks assessed/performed;Flat affect                Past Medical History:  Diagnosis Date   Anemia    severe   Anxiety    Asthma    CKD (chronic kidney disease)    stage IV   DDD (degenerative disc disease), cervical    Depression    Diabetes mellitus ([place])    Dislocated shoulder    right   GERD (gastroesophageal reflux disease)    Headache    migraines (about once a month)   History of kidney stones    Hypertension    Neuropathy    Peripheral vascular disease ([place])    Pneumonia    Stroke [facility])    Past Surgical History:  Procedure Laterality Date   ADENOIDECTOMY     APPENDECTOMY     CERVICAL SPINE SURGERY  06/2012   C5-C6   EYE SURGERY     left eye surgery    FOOT SURGERY     KENALOG INJECTION Left 03/10/2021   Procedure: Intravitreal Avastin Injection;  Surgeon: Sherlynn Stalls, MD;  Location: [place];  Service: Ophthalmology;  Laterality: Left;   LOOP RECORDER INSERTION N/A 08/27/2017   Procedure: LOOP RECORDER INSERTION;  Surgeon: Sanda Klein, MD;  Location: [place] [facility];  Service: Cardiovascular;  Laterality: N/A;   MEMBRANE PEEL Left 03/10/2021   Procedure: MEMBRANE PEEL;  Surgeon: Sherlynn Stalls, MD;  Location: [place];  Service: Ophthalmology;  Laterality: Left;   PARS PLANA VITRECTOMY Left 03/10/2021    Procedure: TWENTY-FIVE GAUGE PARS PLANA VITRECTOMY;  Surgeon: Sherlynn Stalls, MD;  Location: [place];  Service: Ophthalmology;  Laterality: Left;   PHOTOCOAGULATION WITH LASER Left 03/10/2021   Procedure: PHOTOCOAGULATION WITH LASER;  Surgeon: Sherlynn Stalls, MD;  Location: [place];  Service: Ophthalmology;  Laterality: Left;   SHOULDER SURGERY     TEE WITHOUT CARDIOVERSION N/A 08/27/2017   Procedure: TRANSESOPHAGEAL ECHOCARDIOGRAM (TEE);  Surgeon: Sanda Klein, MD;  Location: [place];  Service: Cardiovascular;  Laterality: N/A;   TONSILLECTOMY     Patient Active Problem List   Diagnosis Date Noted   Numbness and tingling in left hand 03/02/2022   Gait disturbance, post-stroke 03/02/2022   AKI (acute kidney injury) ([place]) 92/04/9416   Acute embolic stroke ([place]) 40/81/4481   Hypernatremia 12/20/2021   Chronic kidney disease (CKD), stage IV (severe) ([place]) 12/19/2021   Left-sided weakness and visual deficits  12/19/2021   Vision abnormalities 12/19/2021   Stroke ([place]) 11/10/2021   Depression 07/05/2020   CVA (cerebral vascular accident) ([place]) 06/21/2020   CVA (cerebrovascular accident) ([place]) 06/19/2020   Generalized anxiety disorder 03/01/2019   Abnormality of gait 10/14/2018   Dizziness and giddiness 09/13/2018   Debility 06/26/2018  Chronic nonintractable headache 25/10/3974   Diastolic dysfunction    Congestion of nasal sinus    Pneumonia of left lower lobe due to infectious organism    SOB (shortness of breath)    Candidiasis    Anemia    Hypoalbuminemia due to protein-calorie malnutrition (HCC)    Hypertension    Diabetes mellitus ([place])    PRES (posterior reversible encephalopathy syndrome)    Hypokalemia    History of CVA with residual deficit    History of seizure 03/25/2018   Bilateral carpal tunnel syndrome 02/14/2018   Arthritis 10/31/2017   Asthma 10/31/2017   Constipation 10/31/2017   Kidney stone 10/31/2017   Anemia, chronic disease 08/09/2017    Hyperlipidemia 07/26/2017   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[place]) 07/25/2017   Anxiety as acute reaction to exceptional stress 04/23/2017   Insomnia 10/08/2016   Neuropathy 06/07/2016   Gait instability    Lesion of pons    Slurred speech    Dizziness 04/30/2016   Hyperglycemia 04/30/2016   Chronic kidney disease (CKD), stage III (moderate) ([place]) 04/30/2016   Ataxia 04/30/2016   Chronic right shoulder pain 04/03/2016   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   Rhinitis, allergic 08/25/2015   Asthma, mild intermittent 07/14/2015   Gastroesophageal reflux disease without esophagitis 06/08/2015   Hypertensive cardiovascular disease 05/05/2015   Vitamin D deficiency 05/05/2015    ONSET DATE: 12/18/21  REFERRING DIAG:  I63.9 (BHA-19-FX) - Acute embolic stroke (HCC)  T02.4 (ICD-10-CM) - Left-sided weakness    THERAPY DIAG:  Muscle weakness (generalized)  Other lack of coordination  Stiffness of right shoulder, not elsewhere classified  Chronic right shoulder pain  Left-sided weakness and visual deficits   Other abnormalities of gait and mobility  Other symptoms and signs involving the nervous system  Attention and concentration deficit  Rationale for Evaluation and Treatment Rehabilitation  PERTINENT HISTORY:50 y.o. female with medical history significant of CKD stage IV, asthma, anxiety and depression, T2DM, HTN, GERD, PVD, hx of CVA on DAPT who presented to [facility] ED on 7/9 with complaints of left sided weakness and vision changes.MRI revealing acute ischemia, nonhemorrhagic infarct involving deep white matter of posterior R frontal centrum semi ovale. Prior CVA 6/1, hypertension, hyperlipidemia, diabetes, chronic kidney disease, prior strokes, history of seizures   D/c from rehab 12/29/21 Pt has an aide M-F to assist with dressing   PRECAUTIONS: Other: no driving  WEIGHT BEARING RESTRICTIONS No   SUBJECTIVE:   SUBJECTIVE STATEMENT: She arrives quite late for treatment reporting  her grand daughter had an issue PAIN:  Are you having pain? Yes: NPRS scale: 10/10 Pain location: L upper trap and shoulder Pain description: aching Aggravating factors: movement, alleviating unknown  FALLS: Has patient fallen in last 6 months? Yes. Number of falls 1  LIVING ENVIRONMENT: Lives with: lives with their family, pt's dtr goes back to college 01/26/22 Lives in: House/apartment Stairs: No Has following equipment at home: Gilford Rile - 2 wheeled and Shower bench  PLOF: Needs assistance with ADLs and Needs assistance with homemaking  PATIENT GOALS to get back to prior level of function. Pt reports she had assist with ADLs prior to most recent CVA   OBJECTIVE: (All objective assessments below are from initial evaluation on: 01/13/22 unless otherwise specified.)   03/03/22: AROM for Lt shoulder:  Flex: 101*;  Abd: 98*   Ext: 42*  IR: 20*  ER:  37*  Observations: Positive Lt elbow flexion test, Positive Lt Tinel's test ulnar nerve    TODAY'S TREATMENT:  Pt arrived late for appointment.  Reviewed the following exercises 5-10 reps each, min v.c from HEP issued last visit  Exercises - Seated Bilateral Shoulder Flexion Towel Slide at Table Top  - 3-4 x daily - 1 sets - 3-5 reps - 15 sec hold - Seated Shoulder Abduction Towel Slide at Table Top with Forearm in Neutral  - 3-5 x daily - 1 sets - 3-5 reps - 15 sec hold - Doorway Stretches (both arms low, lean in gently)   - 3-4 x daily - 3-5 reps - 15 hold  Supine closed chain chest press, min-mod v.c, discontinued due to shoulder pain Closed chain shoulder flexion in supine, min v.c improved performance.     PATIENT EDUCATION: Education details: HEP's Person educated: Patient Education method: Adult nurse,  Education comprehension: verbalized understanding, returned demonstration   HOME EXERCISE PROGRAM: Access Code: [email or web address] URL: [email or web address] Date: 03/03/2022 Prepared by: Benito Mccreedy    GOALS: Goals reviewed with patient? Yes   LONG TERM GOALS: Target date: 04/07/22  I with HEP Baseline:  Goal status: IN PROGRESS  2. Mod I with basic cooking tasks demonstrating good safety awareness Baseline:  Goal status: INITIAL  3.  I with adapted strategies to maximize safety and I with ADLs/IADLS. Baseline:  Goal status: INITIAL    ASSESSMENT:  CLINICAL IMPRESSION: Pt arrived late today. Progress remains limited by shoulder pain.   PLAN: OT FREQUENCY: 1x/week  OT DURATION: 12 weeks due to scheduling, anticipate d/c after 4-6 weeks  PLANNED INTERVENTIONS: self care/ADL training, therapeutic exercise, therapeutic activity, neuromuscular re-education, manual therapy, balance training, functional mobility training, splinting, ultrasound, paraffin, fluidotherapy, moist heat, cryotherapy, patient/family education, cognitive remediation/compensation, visual/perceptual remediation/compensation, energy conservation, coping strategies training, and DME and/or AE instructions  RECOMMENDED OTHER SERVICES: She is getting PT now  CONSULTED AND AGREED WITH PLAN OF CARE: Patient  PLAN FOR NEXT SESSION:   Pt added several additional visits though 10/25 will need to address shoulder pain and  safety for cooking,  anticipate d/c at end of POC.  03/10/2022, 9:51 AM Theone Murdoch, OTR/L [phone] Phone: [phone] 1:53 PM 03/10/22

## 2022-03-17 LAB — LIPOPROTEIN A (LPA): Lipoprotein (a): 47.4 nmol/L (ref <75.0)

## 2022-03-22 ENCOUNTER — Ambulatory Visit: Payer: Medicare Other | Admitting: Rehabilitative and Restorative Service Providers"

## 2022-03-28 NOTE — Therapy (Addendum)
OUTPATIENT OCCUPATIONAL THERAPY NEURO TREATMENT & DISCHARGE  Patient Name: Christina Orozco MRN: [id number] DOB:1970-10-31, 51 y.o., female Today's Date: 03/29/2022  PCP: Dr. Rayburn Go REFERRING PROVIDER: Dr. Letta Pate   OT End of Session - 03/29/22 0940     Visit Number 5    Number of Visits 12    Date for OT Re-Evaluation 04/05/22    Authorization Type Medicare    Authorization Time Period POC written for 12 weeks, anticipate d/c after 4-6 weeks    Authorization - Visit Number 5    Progress Note Due on Visit 10    OT Start Time 0940    OT Stop Time 1015    OT Time Calculation (min) 35 min    Activity Tolerance Patient tolerated treatment well    Behavior During Therapy WFL for tasks assessed/performed;Flat affect             Past Medical History:  Diagnosis Date   Anemia    severe   Anxiety    Asthma    CKD (chronic kidney disease)    stage IV   DDD (degenerative disc disease), cervical    Depression    Diabetes mellitus ([place])    Dislocated shoulder    right   GERD (gastroesophageal reflux disease)    Headache    migraines (about once a month)   History of kidney stones    Hypertension    Neuropathy    Peripheral vascular disease ([place])    Pneumonia    Stroke [facility])    Past Surgical History:  Procedure Laterality Date   ADENOIDECTOMY     APPENDECTOMY     CERVICAL SPINE SURGERY  06/2012   C5-C6   EYE SURGERY     left eye surgery    FOOT SURGERY     KENALOG INJECTION Left 03/10/2021   Procedure: Intravitreal Avastin Injection;  Surgeon: Sherlynn Stalls, MD;  Location: [place];  Service: Ophthalmology;  Laterality: Left;   LOOP RECORDER INSERTION N/A 08/27/2017   Procedure: LOOP RECORDER INSERTION;  Surgeon: Sanda Klein, MD;  Location: [place] [facility];  Service: Cardiovascular;  Laterality: N/A;   MEMBRANE PEEL Left 03/10/2021   Procedure: MEMBRANE PEEL;  Surgeon: Sherlynn Stalls, MD;  Location: [place];  Service: Ophthalmology;  Laterality: Left;    PARS PLANA VITRECTOMY Left 03/10/2021   Procedure: TWENTY-FIVE GAUGE PARS PLANA VITRECTOMY;  Surgeon: Sherlynn Stalls, MD;  Location: [place];  Service: Ophthalmology;  Laterality: Left;   PHOTOCOAGULATION WITH LASER Left 03/10/2021   Procedure: PHOTOCOAGULATION WITH LASER;  Surgeon: Sherlynn Stalls, MD;  Location: [place];  Service: Ophthalmology;  Laterality: Left;   SHOULDER SURGERY     TEE WITHOUT CARDIOVERSION N/A 08/27/2017   Procedure: TRANSESOPHAGEAL ECHOCARDIOGRAM (TEE);  Surgeon: Sanda Klein, MD;  Location[facility];  Service: Cardiovascular;  Laterality: N/A;   TONSILLECTOMY     Patient Active Problem List   Diagnosis Date Noted   Numbness and tingling in left hand 03/02/2022   Gait disturbance, post-stroke 03/02/2022   AKI (acute kidney injury) ([place]) 87/56/4332   Acute embolic stroke ([place]) 95/18/8416   Hypernatremia 12/20/2021   Chronic kidney disease (CKD), stage IV (severe) ([place]) 12/19/2021   Left-sided weakness and visual deficits  12/19/2021   Vision abnormalities 12/19/2021   Stroke ([place]) 11/10/2021   Depression 07/05/2020   CVA (cerebral vascular accident) ([place]) 06/21/2020   CVA (cerebrovascular accident) ([place]) 06/19/2020   Generalized anxiety disorder 03/01/2019   Abnormality of gait 10/14/2018   Dizziness and giddiness  09/13/2018   Debility 06/26/2018   Chronic nonintractable headache 41/96/2229   Diastolic dysfunction    Congestion of nasal sinus    Pneumonia of left lower lobe due to infectious organism    SOB (shortness of breath)    Candidiasis    Anemia    Hypoalbuminemia due to protein-calorie malnutrition (HCC)    Hypertension    Diabetes mellitus ([place])    PRES (posterior reversible encephalopathy syndrome)    Hypokalemia    History of CVA with residual deficit    History of seizure 03/25/2018   Bilateral carpal tunnel syndrome 02/14/2018   Arthritis 10/31/2017   Asthma 10/31/2017   Constipation 10/31/2017   Kidney stone 10/31/2017   Anemia,  chronic disease 08/09/2017   Hyperlipidemia 07/26/2017   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[place]) 07/25/2017   Anxiety as acute reaction to exceptional stress 04/23/2017   Insomnia 10/08/2016   Neuropathy 06/07/2016   Gait instability    Lesion of pons    Slurred speech    Dizziness 04/30/2016   Hyperglycemia 04/30/2016   Chronic kidney disease (CKD), stage III (moderate) ([place]) 04/30/2016   Ataxia 04/30/2016   Chronic right shoulder pain 04/03/2016   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 female with medical history significant of CKD stage IV, asthma, anxiety and depression, T2DM, HTN, GERD, PVD, hx of CVA on DAPT who presented to [facility] ED on 7/9 with complaints of left sided weakness and vision changes.MRI revealing acute ischemia, nonhemorrhagic infarct involving deep white matter of posterior R frontal centrum semi ovale. Prior CVA 6/1, hypertension, hyperlipidemia, diabetes, chronic kidney disease, prior strokes, history of seizures   D/c from rehab 12/29/21 Pt has an aide M-F to assist with dressing   PRECAUTIONS: Other: no driving  WEIGHT BEARING RESTRICTIONS No   SUBJECTIVE:   SUBJECTIVE STATEMENT: She arrives late after not coming in for over 2 weeks. She states her leg has been cramping up and that her biggest issue/pain is still her Lt shoulder pain. She admits to not doing HEP most days  recently due to headaches and fear of new strokes.  She states no symptoms of CVA other than headache and OT tells her to contact her neurologist. Her blood pressure is running about 142/22mmHg today (she states normally about ~130/90).   PAIN:  Are you having pain? Yes: NPRS scale: 9/10 Pain location: L upper trap and shoulder Pain description: aching Aggravating factors: movement, alleviating unknown  FALLS: Has patient fallen in last 6 months? Yes. Number of falls 1  LIVING ENVIRONMENT: Lives with: lives with their family, pt's dtr goes back to college 01/26/22 Lives in: House/apartment Stairs: No Has following equipment at home: Gilford Rile - 2 wheeled and Shower bench  PLOF: Needs assistance with ADLs and Needs assistance with homemaking  PATIENT GOALS to get back to prior level of function. Pt reports she had assist with ADLs prior to most recent CVA    OBJECTIVE: (All objective assessments below are from initial evaluation  on: 01/13/22 unless otherwise specified.)   04/05/22: AROM for Lt shoulder:  Flex: TBD;  Abd: TBD   Ext: TBD  IR: TBD  ER:  TBD   03/03/22: AROM for Lt shoulder:  Flex: 101*;  Abd: 98*   Ext: 42*  IR: 20*  ER:  37*  Observations: Positive Lt elbow flexion test, Positive Lt Tinel's test ulnar nerve    TODAY'S TREATMENT:  03/29/22: OT leads her through her shoulder stretches, which she apparently forgot, but does very well with- no added pain.  OT adds sh IR stretches and scap retraction(as below bolded).   Exercises - Seated Scapular Retraction  - 4-6 x daily - 5-10 reps - Seated Bilateral Shoulder Flexion Towel Slide at Table Top  - 3-4 x daily - 1 sets - 3-5 reps - 15 sec hold - Seated Shoulder Abduction Towel Slide at Table Top with Forearm in Neutral  - 3-5 x daily - 1 sets - 3-5 reps - 15 sec hold - Doorway Stretches (both arms low, lean in gently)   - 3-4 x daily - 3-5 reps - 15 hold - Standing Shoulder Internal Rotation Stretch Behind Back  - 4-6 x daily  - 3-5 reps - 15 sec hold   PATIENT EDUCATION: Education details: HEP's Person educated: Patient Education method: Adult nurse,  Education comprehension: verbalized understanding, returned demonstration   HOME EXERCISE PROGRAM: Access Code: [email or web address] URL: [email or web address]   GOALS: Goals reviewed with patient? Yes   LONG TERM GOALS: Target date: 04/07/22  I with HEP Baseline:  Goal status: IN PROGRESS  2. Mod I with basic cooking tasks demonstrating good safety awareness Baseline:  Goal status: INITIAL  3.  I with adapted strategies to maximize safety and I with ADLs/IADLS. Baseline:  Goal status: INITIAL    ASSESSMENT:  CLINICAL IMPRESSION: 03/29/22: She has no apparent pain today at rest or with doing HEP. States feeling better at end. She has 1 more week of therapy per POC. She has not been attending therapy or doing HEP consistently, and likely won't meet all goals. Due to chronic nature of symptoms and poor follow-through / attendance to therapy (comes every-other-week) with recommendations, she will probably be discharged at next visit for max benefit made from OT.    PLAN: OT FREQUENCY: 1x/week  OT DURATION: 12 weeks due to scheduling, anticipate d/c after 4-6 weeks  PLANNED INTERVENTIONS: self care/ADL training, therapeutic exercise, therapeutic activity, neuromuscular re-education, manual therapy, balance training, functional mobility training, splinting, ultrasound, paraffin, fluidotherapy, moist heat, cryotherapy, patient/family education, cognitive remediation/compensation, visual/perceptual remediation/compensation, energy conservation, coping strategies training, and DME and/or AE instructions  RECOMMENDED OTHER SERVICES: She is getting PT now  CONSULTED AND AGREED WITH PLAN OF CARE: Patient  PLAN FOR NEXT SESSION:   Reassess next visit at end of current POC.    Benito Mccreedy, OTR/L, CHT 03/29/2022, 2:12  PM [phone] Phone: [phone] 2:12 PM 03/29/22     OCCUPATIONAL THERAPY DISCHARGE SUMMARY  Visits from Start of Care: 5  Current functional level related to goals / functional outcomes: She was discharged 04/05/22 due to no showing and cancelling most appointments, including her last scheduled visit.  Goals could not be addressed, but she was doing very well at last visit and had great HEP to manage shoulder pain (though she stated not doing consistently). See notes for details.   Benito Mccreedy, OTR/L, CHT 04/05/22

## 2022-03-29 ENCOUNTER — Ambulatory Visit
Payer: Medicare Other | Attending: Physical Medicine and Rehabilitation | Admitting: Rehabilitative and Restorative Service Providers"

## 2022-03-29 ENCOUNTER — Encounter: Payer: Self-pay | Admitting: Rehabilitative and Restorative Service Providers"

## 2022-03-29 DIAGNOSIS — M25512 Pain in left shoulder: Secondary | ICD-10-CM | POA: Diagnosis present

## 2022-03-29 DIAGNOSIS — M25611 Stiffness of right shoulder, not elsewhere classified: Secondary | ICD-10-CM | POA: Diagnosis present

## 2022-03-29 DIAGNOSIS — R278 Other lack of coordination: Secondary | ICD-10-CM | POA: Diagnosis present

## 2022-03-29 DIAGNOSIS — G8929 Other chronic pain: Secondary | ICD-10-CM | POA: Diagnosis present

## 2022-03-29 DIAGNOSIS — M6281 Muscle weakness (generalized): Secondary | ICD-10-CM | POA: Diagnosis present

## 2022-04-04 NOTE — Therapy (Incomplete)
OUTPATIENT OCCUPATIONAL THERAPY NEURO TREATMENT & DISCHARGE NOTE  Patient Name: Christina Orozco MRN: [id number] DOB:13-Jun-1970, 51 y.o., female Today's Date: 04/04/2022  PCP: Dr. Rayburn Go REFERRING PROVIDER: Dr. Letta Pate     Past Medical History:  Diagnosis Date   Anemia    severe   Anxiety    Asthma    CKD (chronic kidney disease)    stage IV   DDD (degenerative disc disease), cervical    Depression    Diabetes mellitus ([place])    Dislocated shoulder    right   GERD (gastroesophageal reflux disease)    Headache    migraines (about once a month)   History of kidney stones    Hypertension    Neuropathy    Peripheral vascular disease ([place])    Pneumonia    Stroke [facility])    Past Surgical History:  Procedure Laterality Date   ADENOIDECTOMY     APPENDECTOMY     CERVICAL SPINE SURGERY  06/2012   C5-C6   EYE SURGERY     left eye surgery    FOOT SURGERY     KENALOG INJECTION Left 03/10/2021   Procedure: Intravitreal Avastin Injection;  Surgeon: Sherlynn Stalls, MD;  Location: [place];  Service: Ophthalmology;  Laterality: Left;   LOOP RECORDER INSERTION N/A 08/27/2017   Procedure: LOOP RECORDER INSERTION;  Surgeon: Sanda Klein, MD;  Location: [place] [facility];  Service: Cardiovascular;  Laterality: N/A;   MEMBRANE PEEL Left 03/10/2021   Procedure: MEMBRANE PEEL;  Surgeon: Sherlynn Stalls, MD;  Location: [place];  Service: Ophthalmology;  Laterality: Left;   PARS PLANA VITRECTOMY Left 03/10/2021   Procedure: TWENTY-FIVE GAUGE PARS PLANA VITRECTOMY;  Surgeon: Sherlynn Stalls, MD;  Location: [place];  Service: Ophthalmology;  Laterality: Left;   PHOTOCOAGULATION WITH LASER Left 03/10/2021   Procedure: PHOTOCOAGULATION WITH LASER;  Surgeon: Sherlynn Stalls, MD;  Location: [place];  Service: Ophthalmology;  Laterality: Left;   SHOULDER SURGERY     TEE WITHOUT CARDIOVERSION N/A 08/27/2017   Procedure: TRANSESOPHAGEAL ECHOCARDIOGRAM (TEE);  Surgeon: Sanda Klein, MD;  Location: [facility]  ENDOSCOPY;  Service: Cardiovascular;  Laterality: N/A;   TONSILLECTOMY     Patient Active Problem List   Diagnosis Date Noted   Numbness and tingling in left hand 03/02/2022   Gait disturbance, post-stroke 03/02/2022   AKI (acute kidney injury) ([place]) 12/87/8676   Acute embolic stroke ([place]) 72/02/4708   Hypernatremia 12/20/2021   Chronic kidney disease (CKD), stage IV (severe) ([place]) 12/19/2021   Left-sided weakness and visual deficits  12/19/2021   Vision abnormalities 12/19/2021   Stroke ([place]) 11/10/2021   Depression 07/05/2020   CVA (cerebral vascular accident) ([place]) 06/21/2020   CVA (cerebrovascular accident) ([place]) 06/19/2020   Generalized anxiety disorder 03/01/2019   Abnormality of gait 10/14/2018   Dizziness and giddiness 09/13/2018   Debility 06/26/2018   Chronic nonintractable headache 62/83/6629   Diastolic dysfunction    Congestion of nasal sinus    Pneumonia of left lower lobe due to infectious organism    SOB (shortness of breath)    Candidiasis    Anemia    Hypoalbuminemia due to protein-calorie malnutrition ([place])    Hypertension    Diabetes mellitus ([place])    PRES (posterior reversible encephalopathy syndrome)    Hypokalemia    History of CVA with residual deficit    History of seizure 03/25/2018   Bilateral carpal tunnel syndrome 02/14/2018   Arthritis 10/31/2017   Asthma 10/31/2017   Constipation 10/31/2017   Kidney stone 10/31/2017  Anemia, chronic disease 08/09/2017   Hyperlipidemia 07/26/2017   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[place]) 07/25/2017   Anxiety as acute reaction to exceptional stress 04/23/2017   Insomnia 10/08/2016   Neuropathy 06/07/2016   Gait instability    Lesion of pons    Slurred speech    Dizziness 04/30/2016   Hyperglycemia 04/30/2016   Chronic kidney disease (CKD), stage III (moderate) ([place]) 04/30/2016   Ataxia 04/30/2016   Chronic right shoulder pain 04/03/2016   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  Rationale for Evaluation and Treatment Rehabilitation  PERTINENT HISTORY:50 y.o. female with medical history significant of CKD stage IV, asthma, anxiety and depression, T2DM, HTN, GERD, PVD, hx of CVA on DAPT who presented [facility] ED on 7/9 with complaints of left sided weakness and vision changes.MRI revealing acute ischemia, nonhemorrhagic infarct involving deep white matter of posterior R frontal centrum semi ovale. Prior CVA 6/1, hypertension, hyperlipidemia, diabetes, chronic kidney disease, prior strokes, history of seizures   D/c from rehab 12/29/21 Pt has an aide M-F to assist with dressing   PRECAUTIONS: Other: no driving  WEIGHT BEARING RESTRICTIONS No   SUBJECTIVE:   SUBJECTIVE STATEMENT: She states ***  arrives late after not coming in for over 2 weeks. She states her leg has been cramping up and that her biggest issue/pain is still her Lt shoulder pain. She admits to not doing HEP most days recently due to headaches and fear of new strokes.  She states no symptoms of CVA other than headache and OT tells her to contact her neurologist. Her blood pressure is running about 142/27mHg today (she states normally about ~130/90).   PAIN:  Are you having pain? Yes: NPRS scale: 9/10 Pain location: L upper trap and shoulder Pain description: aching Aggravating factors: movement, alleviating unknown   PATIENT GOALS to get back to prior level of function. Pt reports she had assist with ADLs prior to most recent CVA    OBJECTIVE: (All objective assessments below are from initial evaluation on: 01/13/22 unless otherwise  specified.)   04/05/22: AROM for Lt shoulder:  Flex: ***;  Abd: ***   Ext: ***  IR: ***  ER:  ***   03/03/22: AROM for Lt shoulder:  Flex: 101*;  Abd: 98*   Ext: 42*  IR: 20*  ER:  37*  Observations: Positive Lt elbow flexion test, Positive Lt Tinel's test ulnar nerve    TODAY'S TREATMENT:  04/05/22: Pt performs AROM, gripping, and strength with *** against resistance for exercise/activities as well as new measures today. Using that data, OT also reviews home exercises and provides appropriate upgrades as below. OT also discusses home and functional tasks with the pt and reviews goals. Pt states understanding and tolerates upgrades well.     03/29/22: OT leads her through her shoulder stretches, which she apparently forgot, but does very well with- no added pain.  OT adds sh IR stretches and scap retraction(as below bolded).   Exercises - Seated Scapular Retraction  - 4-6 x daily - 5-10 reps - Seated Bilateral Shoulder Flexion Towel Slide at Table Top  - 3-4 x daily - 1 sets -  3-5 reps - 15 sec hold - Seated Shoulder Abduction Towel Slide at Table Top with Forearm in Neutral  - 3-5 x daily - 1 sets - 3-5 reps - 15 sec hold - Doorway Stretches (both arms low, lean in gently)   - 3-4 x daily - 3-5 reps - 15 hold - Standing Shoulder Internal Rotation Stretch Behind Back  - 4-6 x daily - 3-5 reps - 15 sec hold   PATIENT EDUCATION: Education details: HEP's Person educated: Patient Education method: Adult nurse,  Education comprehension: verbalized understanding, returned demonstration   HOME EXERCISE PROGRAM: Access Code: [email or web address] URL: [email or web address]   GOALS: Goals reviewed with patient? Yes   LONG TERM GOALS: Target date: 04/07/22  I with HEP Goal status: 04/05/22: ***  2. Mod I with basic cooking tasks demonstrating good safety awareness Goal status:04/05/22: ***  3.  I with adapted strategies to maximize safety and I with  ADLs/IADLS. Goal status: 04/05/22: ***    ASSESSMENT:  CLINICAL IMPRESSION: 04/05/22: ***  03/29/22: She has no apparent pain today at rest or with doing HEP. States feeling better at end. She has 1 more week of therapy per POC. She has not been attending therapy or doing HEP consistently, and likely won't meet all goals. Due to chronic nature of symptoms and poor follow-through / attendance to therapy (comes every-other-week) with recommendations, she will probably be discharged at next visit for max benefit made from OT.    PLAN: OT FREQUENCY: 1x/week  OT DURATION: 12 weeks due to scheduling, anticipate d/c after 4-6 weeks  PLANNED INTERVENTIONS: self care/ADL training, therapeutic exercise, therapeutic activity, neuromuscular re-education, manual therapy, balance training, functional mobility training, splinting, ultrasound, paraffin, fluidotherapy, moist heat, cryotherapy, patient/family education, cognitive remediation/compensation, visual/perceptual remediation/compensation, energy conservation, coping strategies training, and DME and/or AE instructions  RECOMMENDED OTHER SERVICES: She is getting PT now  CONSULTED AND AGREED WITH PLAN OF CARE: Patient  PLAN FOR NEXT SESSION:   ***  Reassess next visit at end of current POC.    Benito Mccreedy, OTR/L, CHT 04/04/2022, 1:59 PM [phone] Phone: [phone] 1:59 PM 04/04/22    OCCUPATIONAL THERAPY DISCHARGE SUMMARY  Visits from Start of Care: ***  Current functional level related to goals / functional outcomes: Pt has met all goals to satisfactory levels and is pleased with outcomes. ***  Remaining deficits: Pt has no more significant functional deficits or pain. ***  Education / Equipment: Pt has all needed materials and education. Pt understands how to continue on with self-management. See tx notes for more details.   Patient agrees to discharge due to max benefits received from outpatient occupational  therapy / hand therapy at this time.   Benito Mccreedy, OTR/L, CHT 04/05/22

## 2022-04-05 ENCOUNTER — Ambulatory Visit: Payer: Medicare Other | Admitting: Rehabilitative and Restorative Service Providers"

## 2022-04-05 ENCOUNTER — Telehealth: Payer: Self-pay | Admitting: Rehabilitative and Restorative Service Providers"

## 2022-04-05 NOTE — Telephone Encounter (Signed)
OT called patient to discuss missed appointment. OT left message on her cell phone that she is now discharged from therapy a she missed more appointments than she kept and that today's was to be her last visit anyhow.  She was advised to keep doing HEP for shoulder pain and get new referral in future if future services are needed.

## 2022-04-17 ENCOUNTER — Encounter (HOSPITAL_COMMUNITY): Payer: Self-pay

## 2022-04-24 ENCOUNTER — Ambulatory Visit: Payer: Medicare Other | Attending: Physical Medicine and Rehabilitation | Admitting: Speech Pathology

## 2022-04-24 DIAGNOSIS — R1310 Dysphagia, unspecified: Secondary | ICD-10-CM | POA: Insufficient documentation

## 2022-04-24 NOTE — Patient Instructions (Signed)
If you're swallow test won't be done before your first schedule f/u here, please call to cancel. I'd like to begin therapy when we have all the information to inform our POC.

## 2022-04-24 NOTE — Therapy (Unsigned)
OUTPATIENT SPEECH LANGUAGE PATHOLOGY SWALLOW EVALUATION   Patient Name: Christina Orozco MRN: [id number] DOB:1970/10/15, 51 y.o., female Today's Date: 04/25/2022  PCP: Valere Dross REFERRING PROVIDER: PA-C Cathi Roan   End of Session - 04/25/22 0843     Visit Number 1    Number of Visits 17    Date for SLP Re-Evaluation 07/04/22    Authorization Type [employer] Medicare    Progress Note Due on Visit 10    SLP Start Time 1315    SLP Stop Time  66    SLP Time Calculation (min) 43 min    Activity Tolerance Patient tolerated treatment well             Past Medical History:  Diagnosis Date   Anemia    severe   Anxiety    Asthma    CKD (chronic kidney disease)    stage IV   DDD (degenerative disc disease), cervical    Depression    Diabetes mellitus ([place])    Dislocated shoulder    right   GERD (gastroesophageal reflux disease)    Headache    migraines (about once a month)   History of kidney stones    Hypertension    Neuropathy    Peripheral vascular disease ([place])    Pneumonia    Stroke [facility])    Past Surgical History:  Procedure Laterality Date   ADENOIDECTOMY     APPENDECTOMY     CERVICAL SPINE SURGERY  06/2012   C5-C6   EYE SURGERY     left eye surgery    FOOT SURGERY     KENALOG INJECTION Left 03/10/2021   Procedure: Intravitreal Avastin Injection;  Surgeon: Sherlynn Stalls, MD;  Location: [place];  Service: Ophthalmology;  Laterality: Left;   LOOP RECORDER INSERTION N/A 08/27/2017   Procedure: LOOP RECORDER INSERTION;  Surgeon: Sanda Klein, MD;  Location: [place] [facility];  Service: Cardiovascular;  Laterality: N/A;   MEMBRANE PEEL Left 03/10/2021   Procedure: MEMBRANE PEEL;  Surgeon: Sherlynn Stalls, MD;  Location: [place];  Service: Ophthalmology;  Laterality: Left;   PARS PLANA VITRECTOMY Left 03/10/2021   Procedure: TWENTY-FIVE GAUGE PARS PLANA VITRECTOMY;  Surgeon: Sherlynn Stalls, MD;  Location: [place];  Service: Ophthalmology;  Laterality:  Left;   PHOTOCOAGULATION WITH LASER Left 03/10/2021   Procedure: PHOTOCOAGULATION WITH LASER;  Surgeon: Sherlynn Stalls, MD;  Location: [place];  Service: Ophthalmology;  Laterality: Left;   SHOULDER SURGERY     TEE WITHOUT CARDIOVERSION N/A 08/27/2017   Procedure: TRANSESOPHAGEAL ECHOCARDIOGRAM (TEE);  Surgeon: Sanda Klein, MD;  Location: [place];  Service: Cardiovascular;  Laterality: N/A;   TONSILLECTOMY     Patient Active Problem List   Diagnosis Date Noted   Numbness and tingling in left hand 03/02/2022   Gait disturbance, post-stroke 03/02/2022   AKI (acute kidney injury) ([place]) 33/82/5053   Acute embolic stroke ([place]) 97/67/3419   Hypernatremia 12/20/2021   Chronic kidney disease (CKD), stage IV (severe) ([place]) 12/19/2021   Left-sided weakness and visual deficits  12/19/2021   Vision abnormalities 12/19/2021   Stroke ([place]) 11/10/2021   Depression 07/05/2020   CVA (cerebral vascular accident) ([place]) 06/21/2020   CVA (cerebrovascular accident) ([place]) 06/19/2020   Generalized anxiety disorder 03/01/2019   Abnormality of gait 10/14/2018   Dizziness and giddiness 09/13/2018   Debility 06/26/2018   Chronic nonintractable headache 37/90/2409   Diastolic dysfunction    Congestion of nasal sinus    Pneumonia of left lower lobe due to  infectious organism    SOB (shortness of breath)    Candidiasis    Anemia    Hypoalbuminemia due to protein-calorie malnutrition (HCC)    Hypertension    Diabetes mellitus ([place])    PRES (posterior reversible encephalopathy syndrome)    Hypokalemia    History of CVA with residual deficit    History of seizure 03/25/2018   Bilateral carpal tunnel syndrome 02/14/2018   Arthritis 10/31/2017   Asthma 10/31/2017   Constipation 10/31/2017   Kidney stone 10/31/2017   Anemia, chronic disease 08/09/2017   Hyperlipidemia 07/26/2017   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[place]) 07/25/2017   Anxiety as acute reaction to exceptional stress 04/23/2017   Insomnia 10/08/2016    Neuropathy 06/07/2016   Gait instability    Lesion of pons    Slurred speech    Dizziness 04/30/2016   Hyperglycemia 04/30/2016   Chronic kidney disease (CKD), stage III (moderate) ([place]) 04/30/2016   Ataxia 04/30/2016   Chronic right shoulder pain 04/03/2016   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   Rhinitis, allergic 08/25/2015   Asthma, mild intermittent 07/14/2015   Gastroesophageal reflux disease without esophagitis 06/08/2015   Hypertensive cardiovascular disease 05/05/2015   Vitamin D deficiency 05/05/2015    ONSET DATE: referral 04-07-22   REFERRING DIAG: R13.10 (ICD-10-CM) - Dysphagia   THERAPY DIAG:  Dysphagia, unspecified type  Rationale for Evaluation and Treatment: Rehabilitation  SUBJECTIVE:   SUBJECTIVE STATEMENT: "I have to tell myself to swallow" Pt accompanied by: self  PERTINENT HISTORY: swallowing issues since Jan 2022, pt reports have gotten worse since 2 strokes this prior summer (June + July)  PAIN:  Are you having pain? Yes: NPRS scale: 8/10 Pain location: shoulder/neck  FALLS: Has patient fallen in last 6 months?  No  LIVING ENVIRONMENT: Lives with: lives alone Lives in: House/apartment  PLOF:  Level of assistance: Independent with IADLs Employment: On disability  PATIENT GOALS: "to be able to eat again"  OBJECTIVE:   RECOMMENDATIONS FROM OBJECTIVE SWALLOW STUDY (MBSS/FEES): Jan 2022 "...mild pharyngeal dysphagia though no penetration or aspiration was observed on this study " Recommend updated MBSS d/t subsequent strokes since this instrumental swallow eval and ongoing reports of impaired swallowing.   COGNITION: Overall cognitive status: Within functional limits for tasks assessed  ORAL MOTOR EXAMINATION: Overall status: WFL  CLINICAL SWALLOW ASSESSMENT:   Current diet: regular and thin liquids Dentition: adequate natural dentition Patient directly observed with POs: Yes: regular and thin liquids  Feeding: able to feed  self Liquids provided by: cup Oral phase signs and symptoms: prolonged mastication and prolonged bolus formation Pharyngeal phase signs and symptoms: multiple swallows, immediate throat clear, delayed throat clear, delayed cough, and complaints of residue Comments: Administered Yale Swallow Protocol with pt demonstrating overt difficulties taking sequential sips. No overt s/sx of aspiration within 1 minute of thin liquid intake. Demonstrates throat clears and delayed cough with mixed consistencies. Reports to feeling fearful of swallowing bolus all at once.    PATIENT REPORTED OUTCOME MEASURES (PROM): EAT-10: 28 4: pleasure affected 3: lost weight, unable to go out for meals, swallowing all consistencies requires extra effort, food sticking in throat 2: painful, coughing, stressful     TODAY'S TREATMENT:   04-24-22: Education provided on evaluation results and SLP's recommendations. Initiated training re: swallowing compensations and strategies. Pt verbalizes agreement with POC, all questions answered to satisfaction.    PATIENT EDUCATION: Education details: see above Person educated: Patient Education method: Explanation, Demonstration, and Handouts Education comprehension: verbalized understanding, returned demonstration, and  needs further education   ASSESSMENT:  CLINICAL IMPRESSION: Patient is a 51 y.o. F who was seen today for dysphagia evaluation. Pt reports ongoing swallowing issues despite attempts to implement small bites/sips. Reporting coughing and choking on secretions and PO (liquids and solids) daily, since stroke in 2022. Pt tells SLP swallow difficulties have progressively worsened since strokes this past June/July. Pt demonstrating habitual throat clearing before PO, endorses this happens frequently. Attributes to poor secretion management. Prolonged mastication with piecemeal deglutition even with small bites. Endorses occasional pain with swallow, usual sensation of  residue. MBSS is recommended to objectively assess swallow function and inform POC. Pt in agreement.   OBJECTIVE IMPAIRMENTS: include dysphagia. These impairments are limiting patient from safety when swallowing. Factors affecting potential to achieve goals and functional outcome are ability to learn/carryover information and previous level of function. Patient will benefit from skilled SLP services to address above impairments and improve overall function.  REHAB POTENTIAL: Fair     GOALS: Goals reviewed with patient? Yes  SHORT TERM GOALS: Target date: 06/06/2022  Pt will participate in MBSS Baseline: Goal status: INITIAL  2.  Pt will teach back swallow compensations and strategies and demonstrate during PO intake with rare min-A Baseline:  Goal status: INITIAL  3.  Pt will correctly perform recommended dysphagia exercises, if indicated per instrumental swallow study, with supervision-A Baseline:  Goal status: INITIAL   LONG TERM GOALS: Target date: 06/20/2022  Pt will report daily HEP adherence over 1 week period with mod-I Baseline:  Goal status: INITIAL  2.  Pt will report improvement via EAT-10 patient reported outcome measure by d/c Baseline: 28 Goal status: INITIAL  3.  Pt will report carryover of dysphagia compensations and strategies resulting in improved confidence in meal time PO intake over 1 week period Baseline:  Goal status: INITIAL   PLAN:  SLP FREQUENCY: 2x/week  SLP DURATION: 8 weeks  PLANNED INTERVENTIONS: Aspiration precaution training, Pharyngeal strengthening exercises, Diet toleration management , Trials of upgraded texture/liquids, Cueing hierachy, SLP instruction and feedback, Compensatory strategies, and Patient/family education    Su Monks, CCC-SLP 04/25/2022, 8:44 AM

## 2022-04-25 ENCOUNTER — Encounter: Payer: Self-pay | Admitting: Speech Pathology

## 2022-04-26 ENCOUNTER — Telehealth (HOSPITAL_COMMUNITY): Payer: Self-pay

## 2022-04-26 NOTE — Telephone Encounter (Signed)
Attempted to contact patient to schedule Modified Barium Swallow - left voicemail. 

## 2022-04-27 ENCOUNTER — Other Ambulatory Visit (HOSPITAL_COMMUNITY): Payer: Self-pay

## 2022-04-27 ENCOUNTER — Encounter: Payer: Medicare Other | Attending: Registered Nurse | Admitting: Physical Medicine & Rehabilitation

## 2022-04-27 ENCOUNTER — Encounter: Payer: Self-pay | Admitting: Physical Medicine & Rehabilitation

## 2022-04-27 VITALS — BP 148/90 | HR 77 | Ht 62.0 in | Wt 136.0 lb

## 2022-04-27 DIAGNOSIS — R1310 Dysphagia, unspecified: Secondary | ICD-10-CM

## 2022-04-27 DIAGNOSIS — R200 Anesthesia of skin: Secondary | ICD-10-CM | POA: Insufficient documentation

## 2022-04-27 DIAGNOSIS — R202 Paresthesia of skin: Secondary | ICD-10-CM | POA: Diagnosis not present

## 2022-04-27 NOTE — Progress Notes (Signed)
89 yop female with hx Right centrum semiovale infarct 2023, PRES 2019, left pontine inf 2019,who complains of Left 4 th and 5th finger numbness since the time of the stroke.  Pt denies injury or elbow trauma.  Does get symptoms when L elbow is bent > 90 degrees.    PMH- Bilateral carpal tunnel syndrome moderately severe in 2017 diagnoses by EMG/NCV PSH ACDF C5-6 in 2014  Please see EMG/NCV report  scanned under media tab  Briefly  Evidence of Left ulnar neuropathy affecting ulnar sensory and motor distal latencies and amplitude, no definite amplitude drop across elbow segment.  Needle EMG showed decreased recruitment ulnar innervated muscles in the Left  hand but not at Left FCU.  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  Ulnar sensory findings suggest the abnormality is not due to cervical radiculopathy   Mild distal median sensory latency prolongation is related to prior hx of CTS but much improved vs prior study (pt had conservative care only )  Instructed pt to avoid elbow flexion of >90 deg and to use pillows to position elbow when sleeping

## 2022-04-27 NOTE — Patient Instructions (Signed)
Your physician has ordered a Nerve Conduction Study (NCV) and/or EMG testing.  This is a test to assess the status of your nerves and muscles. For the NCV portion of the test , sticky tabs will be placed on either your hands or feet.  Your nerves will be stimulated using small electrical charges and the speed of the impulse will be measured as it travels down the nerve. For the EMG portion of the test, a small pin will be placed below the surface of the skin to measure the electrical activity in certain muscles in your arms or legs. Eating/drinking prior to testing is ok.  Please DO NOT discontinue ANY medications prior to testing  Your appointment will be scheduled by a member of our team.  You will need to check in with the main reception desk. Your test could take approximately 30 minutes to 1 hour to complete depending on the extent of the test that has been ordered. TESTING ON LEGS/BACK/HIP/FOOT AREA: Bring/wear a pair of shorts.  **ABSOLUTELY NO LOTIONS, MOISTURIZERS, VASELINE, OILS OR CREAMS OF ANY KIND ON ANY BODY PART THE DAY OF THE TEST**  **DEODORANT IS OK TO WEAR**  Please make every effort to keep your scheduled appointment time, as your physician needs the test results prior to your next appointment.  If you have to cancel or reschedule, please do so as soon as possible, so that another patient may use that testing time slot.  If you cancel your appointment, you may also need to reschedule your referring doctor's appointment until the test and results can be completed.  Please call [phone] and ask for Dr. Newton's assistant if you need to do so.  If you have any questions at any time please let us know.  We look forward to working with you!! 

## 2022-05-08 ENCOUNTER — Encounter: Payer: Self-pay | Admitting: Physical Medicine & Rehabilitation

## 2022-05-12 ENCOUNTER — Encounter: Payer: Medicare Other | Admitting: Speech Pathology

## 2022-05-16 ENCOUNTER — Encounter: Payer: Medicare Other | Admitting: Speech Pathology

## 2022-05-16 ENCOUNTER — Ambulatory Visit (HOSPITAL_COMMUNITY)
Admission: RE | Admit: 2022-05-16 | Discharge: 2022-05-16 | Disposition: A | Discharge status: Home / Self Care | Payer: Medicare Other | Source: Ambulatory Visit | Attending: Internal Medicine | Admitting: Internal Medicine

## 2022-05-16 DIAGNOSIS — R1310 Dysphagia, unspecified: Secondary | ICD-10-CM | POA: Insufficient documentation

## 2022-05-19 ENCOUNTER — Ambulatory Visit: Payer: Medicare Other | Attending: Physical Medicine and Rehabilitation | Admitting: Speech Pathology

## 2022-05-19 DIAGNOSIS — R1310 Dysphagia, unspecified: Secondary | ICD-10-CM | POA: Diagnosis present

## 2022-05-19 NOTE — Therapy (Signed)
OUTPATIENT SPEECH LANGUAGE PATHOLOGY SWALLOW EVALUATION   Patient Name: Christina Orozco MRN: [id number] DOB:[birth year], 51 y.o., female Today's Date: 05/19/2022  PCP: Valere Dross REFERRING PROVIDER: PA-C Cathi Roan   End of Session - 05/19/22 1114     Visit Number 2    Number of Visits 49    Date for SLP Re-Evaluation 07/04/22    Authorization Type [employer] Medicare    Progress Note Due on Visit 10    SLP Start Time 1114    SLP Stop Time  1145    SLP Time Calculation (min) 31 min    Activity Tolerance Patient tolerated treatment well              Past Medical History:  Diagnosis Date   Anemia    severe   Anxiety    Asthma    CKD (chronic kidney disease)    stage IV   DDD (degenerative disc disease), cervical    Depression    Diabetes mellitus ([place])    Dislocated shoulder    right   GERD (gastroesophageal reflux disease)    Headache    migraines (about once a month)   History of kidney stones    Hypertension    Neuropathy    Peripheral vascular disease ([place])    Pneumonia    Stroke [facility])    Past Surgical History:  Procedure Laterality Date   ADENOIDECTOMY     APPENDECTOMY     CERVICAL SPINE SURGERY  06/2012   C5-C6   EYE SURGERY     left eye surgery    FOOT SURGERY     KENALOG INJECTION Left 03/10/2021   Procedure: Intravitreal Avastin Injection;  Surgeon: Sherlynn Stalls, MD;  Location: [place];  Service: Ophthalmology;  Laterality: Left;   LOOP RECORDER INSERTION N/A 08/27/2017   Procedure: LOOP RECORDER INSERTION;  Surgeon: Sanda Klein, MD;  Location: [place] [facility];  Service: Cardiovascular;  Laterality: N/A;   MEMBRANE PEEL Left 03/10/2021   Procedure: MEMBRANE PEEL;  Surgeon: Sherlynn Stalls, MD;  Location: [place];  Service: Ophthalmology;  Laterality: Left;   PARS PLANA VITRECTOMY Left 03/10/2021   Procedure: TWENTY-FIVE GAUGE PARS PLANA VITRECTOMY;  Surgeon: Sherlynn Stalls, MD;  Location: [place];  Service: Ophthalmology;  Laterality:  Left;   PHOTOCOAGULATION WITH LASER Left 03/10/2021   Procedure: PHOTOCOAGULATION WITH LASER;  Surgeon: Sherlynn Stalls, MD;  Location: [place];  Service: Ophthalmology;  Laterality: Left;   SHOULDER SURGERY     TEE WITHOUT CARDIOVERSION N/A 08/27/2017   Procedure: TRANSESOPHAGEAL ECHOCARDIOGRAM (TEE);  Surgeon: Sanda Klein, MD;  Location: [place];  Service: Cardiovascular;  Laterality: N/A;   TONSILLECTOMY     Patient Active Problem List   Diagnosis Date Noted   Numbness and tingling in left hand 03/02/2022   Gait disturbance, post-stroke 03/02/2022   AKI (acute kidney injury) ([place]) 26/37/8588   Acute embolic stroke ([place]) 50/27/7412   Hypernatremia 12/20/2021   Chronic kidney disease (CKD), stage IV (severe) ([place]) 12/19/2021   Left-sided weakness and visual deficits  12/19/2021   Vision abnormalities 12/19/2021   Stroke ([place]) 11/10/2021   Depression 07/05/2020   CVA (cerebral vascular accident) ([place]) 06/21/2020   CVA (cerebrovascular accident) ([place]) 06/19/2020   Generalized anxiety disorder 03/01/2019   Abnormality of gait 10/14/2018   Dizziness and giddiness 09/13/2018   Debility 06/26/2018   Chronic nonintractable headache 87/86/7672   Diastolic dysfunction    Congestion of nasal sinus    Pneumonia of left lower lobe due  to infectious organism    SOB (shortness of breath)    Candidiasis    Anemia    Hypoalbuminemia due to protein-calorie malnutrition (HCC)    Hypertension    Diabetes mellitus ([place])    PRES (posterior reversible encephalopathy syndrome)    Hypokalemia    History of CVA with residual deficit    History of seizure 03/25/2018   Bilateral carpal tunnel syndrome 02/14/2018   Arthritis 10/31/2017   Asthma 10/31/2017   Constipation 10/31/2017   Kidney stone 10/31/2017   Anemia, chronic disease 08/09/2017   Hyperlipidemia 07/26/2017   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[place]) 07/25/2017   Anxiety as acute reaction to exceptional stress 04/23/2017   Insomnia 10/08/2016    Neuropathy 06/07/2016   Gait instability    Lesion of pons    Slurred speech    Dizziness 04/30/2016   Hyperglycemia 04/30/2016   Chronic kidney disease (CKD), stage III (moderate) ([place]) 04/30/2016   Ataxia 04/30/2016   Chronic right shoulder pain 04/03/2016   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   Rhinitis, allergic 08/25/2015   Asthma, mild intermittent 07/14/2015   Gastroesophageal reflux disease without esophagitis 06/08/2015   Hypertensive cardiovascular disease 05/05/2015   Vitamin D deficiency 05/05/2015    ONSET DATE: referral 04-07-22   REFERRING DIAG: R13.10 (ICD-10-CM) - Dysphagia   THERAPY DIAG:  Dysphagia, unspecified type  Rationale for Evaluation and Treatment: Rehabilitation  SUBJECTIVE:   SUBJECTIVE STATEMENT: "I have to tell myself to swallow" Pt accompanied by: self  PERTINENT HISTORY: swallowing issues since Jan 2022, pt reports have gotten worse since 2 strokes this prior summer (June + July)  PAIN:  Are you having pain? Yes: NPRS scale: 8/10 Pain location: shoulder/neck  FALLS: Has patient fallen in last 6 months?  No  LIVING ENVIRONMENT: Lives with: lives alone Lives in: House/apartment  PLOF:  Level of assistance: Independent with IADLs Employment: On disability  PATIENT GOALS: "to be able to eat again"  OBJECTIVE:   RECOMMENDATIONS FROM OBJECTIVE SWALLOW STUDY (MBSS/FEES): 05/16/2022 "Patient presents with normal oropharyngeal swallow ability wihtout aspiration or penetration of any consistency tested.  She was tested with thin, nectar, pudding, cracker and barium with thin.  Swallow was strong and timely without barium retention.  Of note, pt continues to appear with potential soft tissue abnormality at tongue base.     Upon esophageal sweep, barium tablet appeared to halt at distal esophagus without clearance despite boluses of thin and pudding.  Pt did not have any sensation to tablet. Radiologist not present during evaluation  and MBS is not designed to evaluate below cervical esophagus.    Of note, pt continues to appear with abnormal soft tissue of tongue base *appears similar to prior MBS 06/2020.  Pt underwent prior CT of her neck 11/2020 (without IV contrast)  which showed "No tongue mass identified. Limited evaluation without intravenous contrast. Coorelate with mucosal evaluation. No adenopathy of the neck.  [place] listed pt has undergone adenoidectomy and tonsillectomy. (uncertain if this was bilateral and when occurred). Pt also reports progressive weight loss which she states her medical team coorelates it to Ozempic use for DM.  Due to above factors, SLP recommends consideration for ENT referral. Message referring OP SLP regarding recommendations."   TODAY'S TREATMENT:   05-19-22: SLP reviewed findings from recent instrumental swallow study, to include demonstration of imaging to support findings. Per pt, she had bilateral adenoidectomy and tonsillectomy in elementary school. SLP performing MBSS noted "potential soft tissue abnormality at tongue base." Per 06/21/2020 CT  SOFT TISSUE NECK "FINDINGS: Pharynx and larynx: Tongue evaluation limited without intravenous contrast. No tongue mass identified. Recommend correlation with mucosal examination. The adenoid and palatine tonsils are mildly prominent. The lingual tonsils are also mildly prominent and symmetric. Normal larynx and epiglottis." Conflict regarding pt report of removal and finding reporting on this imaging. I recommend consideration for ENT consult to better assess potential abnormality and GI referral to assess esophageal phase of swallow. Pt reports PCP has reached out to pt and is referring for both. Advised that no pharyngeal or oral deficits evidenced and as such further ST for dysphagia is not indicated at this time. However, based on pt report, suspect pt could potentially have vocal cord dysfunction. Pt reporting difficulties with inhalation during laughing  episodes, denies occurences with other strong emotions, does not exercise, has reduced sense of smell so denies occurs with strong odors, denies occurring with exposure to smoke or fumes. Reports feels like "stumbling" re: ability to breath, endorses decreased ability to inhale. SLP unable to elicit this session. SLP provided education on VCD and initiated education re: breathing strategies to aid in management when occurs. Pt to trial should episode occur before next visit. Demonstrates nasal inhale and pursed lip exhale with mod-A for optimization of strategy.  04-24-22: Education provided on evaluation results and SLP's recommendations. Initiated training re: swallowing compensations and strategies. Pt verbalizes agreement with POC, all questions answered to satisfaction.    PATIENT EDUCATION: Education details: see above Person educated: Patient Education method: Explanation, Demonstration, and Handouts Education comprehension: verbalized understanding, returned demonstration, and needs further education   ASSESSMENT:  CLINICAL IMPRESSION: Patient is a 51 y.o. F who was seen today for dysphagia evaluation. Pt reports ongoing swallowing issues despite attempts to implement small bites/sips. Reporting coughing and choking on secretions and PO (liquids and solids) daily, since stroke in 2022. Pt tells SLP swallow difficulties have progressively worsened since strokes this past June/July. Pt demonstrating habitual throat clearing before PO, endorses this happens frequently. Attributes to poor secretion management. Prolonged mastication with piecemeal deglutition even with small bites. Endorses occasional pain with swallow, usual sensation of residue. MBSS is recommended to objectively assess swallow function and inform POC. Pt in agreement.   OBJECTIVE IMPAIRMENTS: include dysphagia. These impairments are limiting patient from safety when swallowing. Factors affecting potential to achieve goals and  functional outcome are ability to learn/carryover information and previous level of function. Patient will benefit from skilled SLP services to address above impairments and improve overall function.  REHAB POTENTIAL: Fair     GOALS: Goals reviewed with patient? Yes  SHORT TERM GOALS: Target date: 06/06/2022  Pt will participate in MBSS Baseline: Goal status: INITIAL  2.  Pt will teach back swallow compensations and strategies and demonstrate during PO intake with rare min-A Baseline:  Goal status: INITIAL  3.  Pt will correctly perform recommended dysphagia exercises, if indicated per instrumental swallow study, with supervision-A Baseline:  Goal status: INITIAL   LONG TERM GOALS: Target date: 06/20/2022  Pt will report daily HEP adherence over 1 week period with mod-I Baseline:  Goal status: INITIAL  2.  Pt will report improvement via EAT-10 patient reported outcome measure by d/c Baseline: 28 Goal status: INITIAL  3.  Pt will report carryover of dysphagia compensations and strategies resulting in improved confidence in meal time PO intake over 1 week period Baseline:  Goal status: INITIAL   PLAN:  SLP FREQUENCY: 2x/week  SLP DURATION: 8 weeks  PLANNED INTERVENTIONS: Aspiration precaution  training, Pharyngeal strengthening exercises, Diet toleration management , Trials of upgraded texture/liquids, Cueing hierachy, SLP instruction and feedback, Compensatory strategies, and Patient/family education    Su Monks, CCC-SLP 05/19/2022, 11:15 AM

## 2022-05-22 NOTE — Therapy (Unsigned)
OUTPATIENT SPEECH LANGUAGE PATHOLOGY SWALLOW EVALUATION   Patient Name: Christina Orozco MRN: [id number] DOB:04-May-1971, 51 y.o., female Today's Date: 05/23/2022  PCP: Valere Dross REFERRING PROVIDER: PA-C Cathi Roan   End of Session - 05/23/22 1238     Visit Number 3    Number of Visits 17    Date for SLP Re-Evaluation 07/04/22    Authorization Type [employer]HC Medicare    Progress Note Due on Visit 10    SLP Start Time 1238    SLP Stop Time  1315    SLP Time Calculation (min) 37 min    Activity Tolerance Patient tolerated treatment well               Past Medical History:  Diagnosis Date   Anemia    severe   Anxiety    Asthma    CKD (chronic kidney disease)    stage IV   DDD (degenerative disc disease), cervical    Depression    Diabetes mellitus ([place])    Dislocated shoulder    right   GERD (gastroesophageal reflux disease)    Headache    migraines (about once a month)   History of kidney stones    Hypertension    Neuropathy    Peripheral vascular disease ([place])    Pneumonia    Stroke [facility])    Past Surgical History:  Procedure Laterality Date   ADENOIDECTOMY     APPENDECTOMY     CERVICAL SPINE SURGERY  06/2012   C5-C6   EYE SURGERY     left eye surgery    FOOT SURGERY     KENALOG INJECTION Left 03/10/2021   Procedure: Intravitreal Avastin Injection;  Surgeon: Sherlynn Stalls, MD;  Location: [place];  Service: Ophthalmology;  Laterality: Left;   LOOP RECORDER INSERTION N/A 08/27/2017   Procedure: LOOP RECORDER INSERTION;  Surgeon: Sanda Klein, MD;  Location: [place] [facility];  Service: Cardiovascular;  Laterality: N/A;   MEMBRANE PEEL Left 03/10/2021   Procedure: MEMBRANE PEEL;  Surgeon: Sherlynn Stalls, MD;  Location: [place];  Service: Ophthalmology;  Laterality: Left;   PARS PLANA VITRECTOMY Left 03/10/2021   Procedure: TWENTY-FIVE GAUGE PARS PLANA VITRECTOMY;  Surgeon: Sherlynn Stalls, MD;  Location: [place];  Service: Ophthalmology;   Laterality: Left;   PHOTOCOAGULATION WITH LASER Left 03/10/2021   Procedure: PHOTOCOAGULATION WITH LASER;  Surgeon: Sherlynn Stalls, MD;  Location: [place];  Service: Ophthalmology;  Laterality: Left;   SHOULDER SURGERY     TEE WITHOUT CARDIOVERSION N/A 08/27/2017   Procedure: TRANSESOPHAGEAL ECHOCARDIOGRAM (TEE);  Surgeon: Sanda Klein, MD;  Location: [place];  Service: Cardiovascular;  Laterality: N/A;   TONSILLECTOMY     Patient Active Problem List   Diagnosis Date Noted   Numbness and tingling in left hand 03/02/2022   Gait disturbance, post-stroke 03/02/2022   AKI (acute kidney injury) ([place]) 91/47/8295   Acute embolic stroke ([place]) 62/13/0865   Hypernatremia 12/20/2021   Chronic kidney disease (CKD), stage IV (severe) ([place]) 12/19/2021   Left-sided weakness and visual deficits  12/19/2021   Vision abnormalities 12/19/2021   Stroke ([place]) 11/10/2021   Depression 07/05/2020   CVA (cerebral vascular accident) ([place]) 06/21/2020   CVA (cerebrovascular accident) ([place]) 06/19/2020   Generalized anxiety disorder 03/01/2019   Abnormality of gait 10/14/2018   Dizziness and giddiness 09/13/2018   Debility 06/26/2018   Chronic nonintractable headache 78/46/9629   Diastolic dysfunction    Congestion of nasal sinus    Pneumonia of left lower lobe  due to infectious organism    SOB (shortness of breath)    Candidiasis    Anemia    Hypoalbuminemia due to protein-calorie malnutrition (HCC)    Hypertension    Diabetes mellitus ([place])    PRES (posterior reversible encephalopathy syndrome)    Hypokalemia    History of CVA with residual deficit    History of seizure 03/25/2018   Bilateral carpal tunnel syndrome 02/14/2018   Arthritis 10/31/2017   Asthma 10/31/2017   Constipation 10/31/2017   Kidney stone 10/31/2017   Anemia, chronic disease 08/09/2017   Hyperlipidemia 07/26/2017   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   Anxiety as acute reaction to exceptional stress 04/23/2017   Insomnia  10/08/2016   Neuropathy 06/07/2016   Gait instability    Lesion of pons    Slurred speech    Dizziness 04/30/2016   Hyperglycemia 04/30/2016   Chronic kidney disease (CKD), stage III (moderate) ([place]) 04/30/2016   Ataxia 04/30/2016   Chronic right shoulder pain 04/03/2016   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 Yes: NPRS scale: 8/10 Pain location: shoulder/neck  PATIENT GOALS: "to be able to eat again"  OBJECTIVE:   RECOMMENDATIONS FROM OBJECTIVE SWALLOW STUDY (MBSS/FEES): 05/16/2022 "Patient presents with normal oropharyngeal swallow ability wihtout aspiration or penetration of any consistency tested.  She was tested with thin, nectar, pudding, cracker and barium with thin.  Swallow was strong and timely without barium retention.  Of note, pt continues to appear with potential soft tissue abnormality at tongue base.     Upon esophageal sweep, barium tablet appeared to halt at distal esophagus without clearance despite boluses of thin and pudding.  Pt did not have any sensation to tablet. Radiologist not present during evaluation and MBS is not designed to evaluate below cervical esophagus.    Of note, pt continues to appear with abnormal soft tissue of tongue base *appears similar to prior MBS 06/2020.  Pt underwent prior CT of her neck 11/2020 (without IV contrast)  which showed "No tongue mass identified. Limited evaluation without intravenous contrast.  Coorelate with mucosal evaluation. No adenopathy of the neck.  [place] listed pt has undergone adenoidectomy and tonsillectomy. (uncertain if this was bilateral and when occurred). Pt also reports progressive weight loss which she states her medical team coorelates it to Ozempic use for DM.  Due to above factors, SLP recommends consideration for ENT referral. Message referring OP SLP regarding recommendations."   TODAY'S TREATMENT:   05-22-22: Per pt, occasional globus sensation with solids. SLP recommends trial of liquid wash when sensation occurs. Pt has established GI appointment to assess esophageal phase of swallow per recommendation from MBSS. Provided recommendations to aid in safe and comfortable PO intake: small bites, liquid wash, reflux precautions, thorough mastication, intentional swallow. Re potential Vocal Cord Dysfunction: Pt reports 2 episodes of laughing and decreased ability to breath which could potentially be vocal cord dysfunction. Pt able to demonstrate breathing technique (nasal inhale and pursed lip exhale) in which she reports reduced the severity of the "  attack," though not the duration. SLP educated and demonstrated additional breathing strategies pt could implement: sniff, sniff, blow, straw breathing, semi-occluded exhalation with fricative, and nasal inhale + pursed lip exhale. Pt able to demonstrate all with occasional min-A for technique. Pt to practice all and determine which would be most likely to be readily employable when attack occurs.   05-19-22: SLP reviewed findings from recent instrumental swallow study, to include demonstration of imaging to support findings. Per pt, she had bilateral adenoidectomy and tonsillectomy in elementary school. SLP performing MBSS noted "potential soft tissue abnormality at tongue base." Per 06/21/2020 CT SOFT TISSUE NECK "FINDINGS: Pharynx and larynx: Tongue evaluation limited without intravenous contrast. No tongue mass identified. Recommend  correlation with mucosal examination. The adenoid and palatine tonsils are mildly prominent. The lingual tonsils are also mildly prominent and symmetric. Normal larynx and epiglottis." Conflict regarding pt report of removal and finding reporting on this imaging. I recommend consideration for ENT consult to better assess potential abnormality and GI referral to assess esophageal phase of swallow. Pt reports PCP has reached out to pt and is referring for both. Advised that no pharyngeal or oral deficits evidenced and as such further ST for dysphagia is not indicated at this time. However, based on pt report, suspect pt could potentially have vocal cord dysfunction. Pt reporting difficulties with inhalation during laughing episodes, denies occurences with other strong emotions, does not exercise, has reduced sense of smell so denies occurs with strong odors, denies occurring with exposure to smoke or fumes. Reports feels like "stumbling" re: ability to breath, endorses decreased ability to inhale. SLP unable to elicit this session. SLP provided education on VCD and initiated education re: breathing strategies to aid in management when occurs. Pt to trial should episode occur before next visit. Demonstrates nasal inhale and pursed lip exhale with mod-A for optimization of strategy.  04-24-22: Education provided on evaluation results and SLP's recommendations. Initiated training re: swallowing compensations and strategies. Pt verbalizes agreement with POC, all questions answered to satisfaction.    PATIENT EDUCATION: Education details: see above Person educated: Patient Education method: Explanation, Demonstration, and Handouts Education comprehension: verbalized understanding, returned demonstration, and needs further education   ASSESSMENT:  CLINICAL IMPRESSION: Patient is a 51 y.o. F who was seen today for dysphagia evaluation. Pt reports ongoing swallowing issues despite attempts to implement small  bites/sips. Reporting coughing and choking on secretions and PO (liquids and solids) daily, since stroke in 2022. Pt tells SLP swallow difficulties have progressively worsened since strokes this past June/July. Pt demonstrating habitual throat clearing before PO, endorses this happens frequently. Attributes to poor secretion management. Prolonged mastication with piecemeal deglutition even with small bites. Endorses occasional pain with swallow, usual sensation of residue. MBSS is recommended to objectively assess swallow function and inform POC. Pt in agreement.   OBJECTIVE IMPAIRMENTS: include dysphagia. These impairments are limiting patient from safety when swallowing. Factors affecting potential to achieve goals and functional outcome are ability to learn/carryover information and previous level of function. Patient will benefit from skilled SLP services to address above impairments and improve overall function.  REHAB POTENTIAL: Fair     GOALS: Goals reviewed with patient? Yes  SHORT TERM GOALS: Target date: 06/06/2022  Pt will participate in MBSS Baseline: Goal status: MET  2.  Pt will teach back swallow compensations and strategies and demonstrate during PO intake with rare min-A Baseline:  Goal status: MET  3.  Pt will correctly perform recommended dysphagia exercises, if indicated per  instrumental swallow study, with supervision-A Baseline:  Goal status: deferred, no deficits evidenced during swallow study   LONG TERM GOALS: Target date: 06/20/2022  Pt will report daily HEP adherence over 1 week period with mod-I Baseline:  Goal status: INITIAL  2.  Pt will report improvement via EAT-10 patient reported outcome measure by d/c Baseline: 28 Goal status: INITIAL  3.  Pt will report carryover of dysphagia compensations and strategies resulting in improved confidence in meal time PO intake over 1 week period Baseline:  Goal status: INITIAL   PLAN:  SLP FREQUENCY:  2x/week  SLP DURATION: 8 weeks  PLANNED INTERVENTIONS: Aspiration precaution training, Pharyngeal strengthening exercises, Diet toleration management , Trials of upgraded texture/liquids, Cueing hierachy, SLP instruction and feedback, Compensatory strategies, and Patient/family education    Su Monks, CCC-SLP 05/23/2022, 12:38 PM

## 2022-05-23 ENCOUNTER — Ambulatory Visit: Payer: Medicare Other | Admitting: Speech Pathology

## 2022-05-23 DIAGNOSIS — R1310 Dysphagia, unspecified: Secondary | ICD-10-CM

## 2022-05-23 NOTE — Patient Instructions (Signed)
Breathing Techniques:  Nasal inhale + pursed lip exhale  Inhale + shhh (quiet sound)   Straw (sharp) inhale + pursed lip exhale   Practice 2x a day so when you have difficulty breathing you're readily able to employ your chosen strategy

## 2022-05-24 ENCOUNTER — Encounter (HOSPITAL_COMMUNITY): Payer: Self-pay

## 2022-05-26 ENCOUNTER — Ambulatory Visit: Payer: Medicare Other | Admitting: Speech Pathology

## 2022-05-29 ENCOUNTER — Ambulatory Visit: Payer: Medicare Other | Admitting: Speech Pathology

## 2022-05-29 DIAGNOSIS — R1310 Dysphagia, unspecified: Secondary | ICD-10-CM

## 2022-05-29 NOTE — Therapy (Signed)
OUTPATIENT SPEECH LANGUAGE PATHOLOGY SWALLOW EVALUATION   Patient Name: Christina Orozco MRN: [id number] DOB:[birth year], 51 y.o., female Today's Date: 05/29/2022  PCP: Valere Dross REFERRING PROVIDER: PA-C Cathi Roan   End of Session - 05/29/22 1233     Visit Number 4    Number of Visits 17    Date for SLP Re-Evaluation 07/04/22    Authorization Type [employer] Medicare    Progress Note Due on Visit 10    SLP Start Time 1233    SLP Stop Time  1303    SLP Time Calculation (min) 30 min    Activity Tolerance Patient tolerated treatment well               Past Medical History:  Diagnosis Date   Anemia    severe   Anxiety    Asthma    CKD (chronic kidney disease)    stage IV   DDD (degenerative disc disease), cervical    Depression    Diabetes mellitus ([place])    Dislocated shoulder    right   GERD (gastroesophageal reflux disease)    Headache    migraines (about once a month)   History of kidney stones    Hypertension    Neuropathy    Peripheral vascular disease ([place])    Pneumonia    Stroke [facility])    Past Surgical History:  Procedure Laterality Date   ADENOIDECTOMY     APPENDECTOMY     CERVICAL SPINE SURGERY  06/2012   C5-C6   EYE SURGERY     left eye surgery    FOOT SURGERY     KENALOG INJECTION Left 03/10/2021   Procedure: Intravitreal Avastin Injection;  Surgeon: Sherlynn Stalls, MD;  Location: [place];  Service: Ophthalmology;  Laterality: Left;   LOOP RECORDER INSERTION N/A 08/27/2017   Procedure: LOOP RECORDER INSERTION;  Surgeon: Sanda Klein, MD;  Location: [place] CV LAB;  Service: Cardiovascular;  Laterality: N/A;   MEMBRANE PEEL Left 03/10/2021   Procedure: MEMBRANE PEEL;  Surgeon: Sherlynn Stalls, MD;  Location: [place];  Service: Ophthalmology;  Laterality: Left;   PARS PLANA VITRECTOMY Left 03/10/2021   Procedure: TWENTY-FIVE GAUGE PARS PLANA VITRECTOMY;  Surgeon: Sherlynn Stalls, MD;  Location: [place];  Service: Ophthalmology;   Laterality: Left;   PHOTOCOAGULATION WITH LASER Left 03/10/2021   Procedure: PHOTOCOAGULATION WITH LASER;  Surgeon: Sherlynn Stalls, MD;  Location: [place];  Service: Ophthalmology;  Laterality: Left;   SHOULDER SURGERY     TEE WITHOUT CARDIOVERSION N/A 08/27/2017   Procedure: TRANSESOPHAGEAL ECHOCARDIOGRAM (TEE);  Surgeon: Sanda Klein, MD;  Location: [place];  Service: Cardiovascular;  Laterality: N/A;   TONSILLECTOMY     Patient Active Problem List   Diagnosis Date Noted   Numbness and tingling in left hand 03/02/2022   Gait disturbance, post-stroke 03/02/2022   AKI (acute kidney injury) ([place]) 32/35/5732   Acute embolic stroke ([place]) 20/25/4270   Hypernatremia 12/20/2021   Chronic kidney disease (CKD), stage IV (severe) ([place]) 12/19/2021   Left-sided weakness and visual deficits  12/19/2021   Vision abnormalities 12/19/2021   Stroke ([place]) 11/10/2021   Depression 07/05/2020   CVA (cerebral vascular accident) ([place]) 06/21/2020   CVA (cerebrovascular accident) ([place]) 06/19/2020   Generalized anxiety disorder 03/01/2019   Abnormality of gait 10/14/2018   Dizziness and giddiness 09/13/2018   Debility 06/26/2018   Chronic nonintractable headache 62/37/6283   Diastolic dysfunction    Congestion of nasal sinus    Pneumonia of left lower lobe  due to infectious organism    SOB (shortness of breath)    Candidiasis    Anemia    Hypoalbuminemia due to protein-calorie malnutrition (HCC)    Hypertension    Diabetes mellitus ([place])    PRES (posterior reversible encephalopathy syndrome)    Hypokalemia    History of CVA with residual deficit    History of seizure 03/25/2018   Bilateral carpal tunnel syndrome 02/14/2018   Arthritis 10/31/2017   Asthma 10/31/2017   Constipation 10/31/2017   Kidney stone 10/31/2017   Anemia, chronic disease 08/09/2017   Hyperlipidemia 07/26/2017   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/25/2017   Anxiety as acute reaction to exceptional stress 04/23/2017   Insomnia  10/08/2016   Neuropathy 06/07/2016   Gait instability    Lesion of pons    Slurred speech    Dizziness 04/30/2016   Hyperglycemia 04/30/2016   Chronic kidney disease (CKD), stage III (moderate) ([place]) 04/30/2016   Ataxia 04/30/2016   Chronic right shoulder pain 04/03/2016   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 No  PATIENT GOALS: "to be able to eat again"  OBJECTIVE:   RECOMMENDATIONS FROM OBJECTIVE SWALLOW STUDY (MBSS/FEES): 05/16/2022 "Patient presents with normal oropharyngeal swallow ability wihtout aspiration or penetration of any consistency tested.  She was tested with thin, nectar, pudding, cracker and barium with thin.  Swallow was strong and timely without barium retention.  Of note, pt continues to appear with potential soft tissue abnormality at tongue base.     Upon esophageal sweep, barium tablet appeared to halt at distal esophagus without clearance despite boluses of thin and pudding.  Pt did not have any sensation to tablet. Radiologist not present during evaluation and MBS is not designed to evaluate below cervical esophagus.    Of note, pt continues to appear with abnormal soft tissue of tongue base *appears similar to prior MBS 06/2020.  Pt underwent prior CT of her neck 11/2020 (without IV contrast)  which showed "No tongue mass identified. Limited evaluation without intravenous contrast. Coorelate with mucosal evaluation. No adenopathy of the neck.   [place] listed pt has undergone adenoidectomy and tonsillectomy. (uncertain if this was bilateral and when occurred). Pt also reports progressive weight loss which she states her medical team coorelates it to Ozempic use for DM.  Due to above factors, SLP recommends consideration for ENT referral. Message referring OP SLP regarding recommendations."   TODAY'S TREATMENT:   05-29-22: Pt reports to successful employment of self selected breathing strategy over the weekend to aid in recovery when difficulty inhaling occurs with laughing bouts. Demonstrates x3 with mod-I.  Teaches back swallowing strategies discussed last session with mod-I. Re-administered the EAT-10, with pt demonstrating significant improvement in subjective perception of swallow function. New score is 10 vs original score of 28. Pt to f/u with GI and ENT, has referrals for both and ENT appointment scheduled for Wednesday.   05-22-22: Per pt, occasional globus sensation with solids. SLP recommends trial of liquid wash when sensation occurs. Pt has established GI appointment to assess esophageal phase  of swallow per recommendation from MBSS. Provided recommendations to aid in safe and comfortable PO intake: small bites, liquid wash, reflux precautions, thorough mastication, intentional swallow. Re potential Vocal Cord Dysfunction: Pt reports 2 episodes of laughing and decreased ability to breath which could potentially be vocal cord dysfunction. Pt able to demonstrate breathing technique (nasal inhale and pursed lip exhale) in which she reports reduced the severity of the "attack," though not the duration. SLP educated and demonstrated additional breathing strategies pt could implement: sniff, sniff, blow, straw breathing, semi-occluded exhalation with fricative, and nasal inhale + pursed lip exhale. Pt able to demonstrate all with occasional min-A for technique. Pt to practice all and determine which would be most likely to be readily employable  when attack occurs.   05-19-22: SLP reviewed findings from recent instrumental swallow study, to include demonstration of imaging to support findings. Per pt, she had bilateral adenoidectomy and tonsillectomy in elementary school. SLP performing MBSS noted "potential soft tissue abnormality at tongue base." Per 06/21/2020 CT SOFT TISSUE NECK "FINDINGS: Pharynx and larynx: Tongue evaluation limited without intravenous contrast. No tongue mass identified. Recommend correlation with mucosal examination. The adenoid and palatine tonsils are mildly prominent. The lingual tonsils are also mildly prominent and symmetric. Normal larynx and epiglottis." Conflict regarding pt report of removal and finding reporting on this imaging. I recommend consideration for ENT consult to better assess potential abnormality and GI referral to assess esophageal phase of swallow. Pt reports PCP has reached out to pt and is referring for both. Advised that no pharyngeal or oral deficits evidenced and as such further ST for dysphagia is not indicated at this time. However, based on pt report, suspect pt could potentially have vocal cord dysfunction. Pt reporting difficulties with inhalation during laughing episodes, denies occurences with other strong emotions, does not exercise, has reduced sense of smell so denies occurs with strong odors, denies occurring with exposure to smoke or fumes. Reports feels like "stumbling" re: ability to breath, endorses decreased ability to inhale. SLP unable to elicit this session. SLP provided education on VCD and initiated education re: breathing strategies to aid in management when occurs. Pt to trial should episode occur before next visit. Demonstrates nasal inhale and pursed lip exhale with mod-A for optimization of strategy.  04-24-22: Education provided on evaluation results and SLP's recommendations. Initiated training re: swallowing compensations and strategies. Pt verbalizes agreement with POC,  all questions answered to satisfaction.    PATIENT EDUCATION: Education details: see above Person educated: Patient Education method: Explanation, Demonstration, and Handouts Education comprehension: verbalized understanding, returned demonstration, and needs further education   ASSESSMENT:  CLINICAL IMPRESSION: Patient is a 51 y.o. F who was seen today for dysphagia evaluation. Pt reports ongoing swallowing issues despite attempts to implement small bites/sips. MBSS indicated some abnormalities with referral recommendation for ENT and GI, oropharyngeal swallow within gross function limits. Education provided on swallow strategies and compensations with pt able to teach back with mod-I. Agrees to ST d/c, will get new referral should new concerns arise or decline in swallow function is evidenced.   OBJECTIVE IMPAIRMENTS: include dysphagia. These impairments are limiting patient from safety when swallowing. Factors affecting potential to achieve goals and functional outcome are ability to learn/carryover information and previous level of function. Patient will benefit from skilled SLP services to address above impairments and improve overall function.  REHAB POTENTIAL: Fair     GOALS: Goals reviewed with patient? Yes  SHORT TERM GOALS: Target date: 06/06/2022  Pt will  participate in MBSS Baseline: Goal status: MET  2.  Pt will teach back swallow compensations and strategies and demonstrate during PO intake with rare min-A Baseline:  Goal status: MET  3.  Pt will correctly perform recommended dysphagia exercises, if indicated per instrumental swallow study, with supervision-A Baseline:  Goal status: deferred, no deficits evidenced during swallow study   LONG TERM GOALS: Target date: 06/20/2022  Pt will report daily HEP adherence over 1 week period with mod-I Baseline:  Goal status: MET  2.  Pt will report improvement via EAT-10 patient reported outcome measure by  d/c Baseline: 28 Goal status: MET  3.  Pt will report carryover of dysphagia compensations and strategies resulting in improved confidence in meal time PO intake over 1 week period Baseline:  Goal status: MET   PLAN:  SLP FREQUENCY: 2x/week  SLP DURATION: 8 weeks  PLANNED INTERVENTIONS: Aspiration precaution training, Pharyngeal strengthening exercises, Diet toleration management , Trials of upgraded texture/liquids, Cueing hierachy, SLP instruction and feedback, Compensatory strategies, and Patient/family education  SPEECH THERAPY DISCHARGE SUMMARY  Visits from Start of Care: 4  Current functional level related to goals / functional outcomes: Carryover of swallowing strategies and compensations reported, no overt difficulties endorsed over 2 week period, [facility] swallow per MBSS.    Remaining deficits: Some stress endorsed re: swallowing, need to think about swallowing to aid in efficacy    Education / Equipment: Swallow strategies and compensations, breathing strategies for when unable to catch breath when laughing, MBSS results    Patient agrees to discharge. Patient goals were met. Patient is being discharged due to meeting the stated rehab goals.    Su Monks, [place] 05/29/2022, 1:06 PM

## 2022-05-31 ENCOUNTER — Encounter: Payer: Medicare Other | Admitting: Speech Pathology

## 2022-06-01 ENCOUNTER — Other Ambulatory Visit: Payer: Self-pay | Admitting: Otolaryngology

## 2022-06-01 DIAGNOSIS — R1319 Other dysphagia: Secondary | ICD-10-CM

## 2022-06-07 ENCOUNTER — Encounter (HOSPITAL_BASED_OUTPATIENT_CLINIC_OR_DEPARTMENT_OTHER): Payer: Self-pay | Admitting: Emergency Medicine

## 2022-06-07 ENCOUNTER — Encounter: Payer: Medicare Other | Admitting: Speech Pathology

## 2022-06-07 ENCOUNTER — Other Ambulatory Visit: Payer: Self-pay

## 2022-06-07 ENCOUNTER — Emergency Department (HOSPITAL_BASED_OUTPATIENT_CLINIC_OR_DEPARTMENT_OTHER)
Admission: EM | Admit: 2022-06-07 | Discharge: 2022-06-07 | Disposition: A | Discharge status: Home / Self Care | Payer: Medicare Other | Attending: Emergency Medicine | Admitting: Emergency Medicine

## 2022-06-07 DIAGNOSIS — I129 Hypertensive chronic kidney disease with stage 1 through stage 4 chronic kidney disease, or unspecified chronic kidney disease: Secondary | ICD-10-CM | POA: Insufficient documentation

## 2022-06-07 DIAGNOSIS — E1122 Type 2 diabetes mellitus with diabetic chronic kidney disease: Secondary | ICD-10-CM | POA: Diagnosis not present

## 2022-06-07 DIAGNOSIS — R051 Acute cough: Secondary | ICD-10-CM | POA: Insufficient documentation

## 2022-06-07 DIAGNOSIS — Z79899 Other long term (current) drug therapy: Secondary | ICD-10-CM | POA: Diagnosis not present

## 2022-06-07 DIAGNOSIS — Z1152 Encounter for screening for COVID-19: Secondary | ICD-10-CM | POA: Insufficient documentation

## 2022-06-07 DIAGNOSIS — J45909 Unspecified asthma, uncomplicated: Secondary | ICD-10-CM | POA: Diagnosis not present

## 2022-06-07 DIAGNOSIS — Z7982 Long term (current) use of aspirin: Secondary | ICD-10-CM | POA: Diagnosis not present

## 2022-06-07 DIAGNOSIS — H9201 Otalgia, right ear: Secondary | ICD-10-CM | POA: Diagnosis not present

## 2022-06-07 DIAGNOSIS — Z7951 Long term (current) use of inhaled steroids: Secondary | ICD-10-CM | POA: Diagnosis not present

## 2022-06-07 DIAGNOSIS — J029 Acute pharyngitis, unspecified: Secondary | ICD-10-CM | POA: Insufficient documentation

## 2022-06-07 DIAGNOSIS — N189 Chronic kidney disease, unspecified: Secondary | ICD-10-CM | POA: Insufficient documentation

## 2022-06-07 LAB — GROUP A STREP BY PCR: Group A Strep by PCR: NOT DETECTED

## 2022-06-07 LAB — RESP PANEL BY RT-PCR (RSV, FLU A&B, COVID)  RVPGX2
Influenza A by PCR: NEGATIVE
Influenza B by PCR: NEGATIVE
Resp Syncytial Virus by PCR: NEGATIVE
SARS Coronavirus 2 by RT PCR: NEGATIVE

## 2022-06-07 MED ORDER — AZITHROMYCIN 250 MG PO TABS: 250.0000 mg | ORAL_TABLET | Freq: Every day | ORAL | 0 refills | Status: DC

## 2022-06-07 NOTE — ED Triage Notes (Signed)
Pt arrives to ED with c/o sore throat, right ear fullness/otalgia, cough that started on 12/23.

## 2022-06-07 NOTE — Discharge Instructions (Signed)
Please take your medications as prescribed. Please take tylenol/ibuprofen for pain. I recommend close follow-up with PCP for reevaluation.  Please do not hesitate to return to emergency department if worrisome signs symptoms we discussed become apparent. 

## 2022-06-07 NOTE — ED Provider Notes (Signed)
[place] EMERGENCY DEPT Provider Note   CSN: [id number] Arrival date & time: 06/07/22  1046     History  Chief Complaint  Patient presents with   Sore Throat   Ear Fullness   Otalgia    Christina Orozco is a 51 y.o. female with a past medical history of asthma, ckd, diabetes, hypertension presenting to the emergency department for evaluation of sore throat, cough, right ear pain since 12/23. Patient reports she has tried Robitussin with no relief. No known sick contact. No fever. She denies any drainage from her ear or hearing loss. She denies any chest pain or shortness of breath.   Sore Throat  Ear Fullness  Otalgia  Past Medical History:  Diagnosis Date   Anemia    severe   Anxiety    Asthma    CKD (chronic kidney disease)    stage IV   DDD (degenerative disc disease), cervical    Depression    Diabetes mellitus (HCC)    Dislocated shoulder    right   GERD (gastroesophageal reflux disease)    Headache    migraines (about once a month)   History of kidney stones    Hypertension    Neuropathy    Peripheral vascular disease ([place])    Pneumonia    Stroke [facility])      Past Surgical History:  Procedure Laterality Date   ADENOIDECTOMY     APPENDECTOMY     CERVICAL SPINE SURGERY  06/2012   C5-C6   EYE SURGERY     left eye surgery    FOOT SURGERY     KENALOG INJECTION Left 03/10/2021   Procedure: Intravitreal Avastin Injection;  Surgeon: Sherlynn Stalls, MD;  Location: [place];  Service: Ophthalmology;  Laterality: Left;   LOOP RECORDER INSERTION N/A 08/27/2017   Procedure: LOOP RECORDER INSERTION;  Surgeon: Sanda Klein, MD;  Location: [place] [facility];  Service: Cardiovascular;  Laterality: N/A;   MEMBRANE PEEL Left 03/10/2021   Procedure: MEMBRANE PEEL;  Surgeon: Sherlynn Stalls, MD;  Location: [place];  Service: Ophthalmology;  Laterality: Left;   PARS PLANA VITRECTOMY Left 03/10/2021   Procedure: TWENTY-FIVE GAUGE PARS PLANA VITRECTOMY;   Surgeon: Sherlynn Stalls, MD;  Location: [place];  Service: Ophthalmology;  Laterality: Left;   PHOTOCOAGULATION WITH LASER Left 03/10/2021   Procedure: PHOTOCOAGULATION WITH LASER;  Surgeon: Sherlynn Stalls, MD;  Location: [place];  Service: Ophthalmology;  Laterality: Left;   SHOULDER SURGERY     TEE WITHOUT CARDIOVERSION N/A 08/27/2017   Procedure: TRANSESOPHAGEAL ECHOCARDIOGRAM (TEE);  Surgeon: Sanda Klein, MD;  Location: [facility] ENDOS[facility];  Service: Cardiovascular;  Laterality: N/A;   TONSILLECTOMY       Home Medications Prior to Admission medications   Medication Sig Start Date End Date Taking? Authorizing Provider  azithromycin (ZITHROMAX) 250 MG tablet Take 1 tablet (250 mg total) by mouth daily. Take first 2 tablets together, then 1 every day until finished. 06/07/22  Yes Rex Kras, PA  acetaminophen (TYLENOL) 325 MG tablet Take 1-2 tablets (325-650 mg total) by mouth every 4 (four) hours as needed for mild pain. 12/30/21   Love, Ivan Anchors, PA-C  albuterol (PROAIR HFA) 108 (90 Base) MCG/ACT inhaler INHALE 1 OR 2 PUFFS INTO THE LUNGS EVERY 6 HOURS AS NEEDED FOR WHEEZING OR SHORTNESS OF BREATH Patient taking differently: Inhale 1-2 puffs into the lungs every 6 (six) hours as needed for shortness of breath or wheezing. 04/12/18   Angiulli, Lavon Paganini, PA-C  amLODipine (NORVASC) 5  MG tablet Take 1 tablet (5 mg total) by mouth daily. 09/26/21   Skeet Latch, MD  aspirin EC 81 MG tablet Take 1 tablet (81 mg total) by mouth daily. 12/30/21   Love, Ivan Anchors, PA-C  atorvastatin (LIPITOR) 80 MG tablet Take 80 mg by mouth at bedtime. 02/25/20   [provider]  carboxymethylcellulose (REFRESH PLUS) 0.5 % SOLN Place 1 drop into both eyes daily as needed (dry eyes).    [provider]  carvedilol (COREG) 12.5 MG tablet Take 1 tablet (12.5 mg total) by mouth 2 (two) times daily with a meal. 10/26/21   Skeet Latch, MD  diclofenac Sodium (VOLTAREN) 1 % GEL Apply 2 g topically daily as needed  (pain).    [provider]  DULoxetine (CYMBALTA) 60 MG capsule Take 60 mg by mouth at bedtime.    [provider]  glucose blood (CONTOUR NEXT TEST) test strip 1 each by Other route 2 (two) times daily. And lancets 2/day Patient taking differently: 1 each by Other route 2 (two) times daily. 04/12/18   Angiulli, Lavon Paganini, PA-C  lidocaine (XYLOCAINE) 1 % (with preservative) injection by Infiltration route. 03/01/22   [provider]  Menthol-Methyl Salicylate (MUSCLE RUB) 10-15 % CREA Apply 1 Application topically 2 (two) times daily as needed for muscle pain. 12/30/21   Love, Ivan Anchors, PA-C  montelukast (SINGULAIR) 10 MG tablet Take 10 mg by mouth at bedtime.    [provider]  OZEMPIC, 0.25 OR 0.5 MG/DOSE, 2 MG/3ML SOPN Inject 0.5 mg into the skin once a week. 04/14/22   [provider]  pantoprazole (PROTONIX) 40 MG tablet Take 1 tablet (40 mg total) by mouth daily. 12/30/21   Love, Ivan Anchors, PA-C  prednisoLONE acetate (PRED FORTE) 1 % ophthalmic suspension INSTILL 1 DROP INTO THE RIGHT EYE 4 TIMES EVERY DAY 03/11/22   [provider]  Semaglutide,0.25 or 0.5MG /DOS, (OZEMPIC, 0.25 OR 0.5 MG/DOSE,) 2 MG/1.5ML SOPN Inject 0.5 mg into the skin See admin instructions. Taking every 10 days 02/27/20   [provider]  ticagrelor (BRILINTA) 90 MG TABS tablet Take 1 tablet by mouth in the morning and at bedtime. 01/11/22   [provider]  traZODone (DESYREL) 50 MG tablet Take 1 tablet (50 mg total) by mouth at bedtime as needed for sleep. 12/30/21   Love, Ivan Anchors, PA-C  triamcinolone acetonide (KENALOG-40) 40 MG/ML injection Inject into the articular space. 03/01/22   [provider]      Allergies    Lisinopril, Penicillins, Quetiapine, and Gabapentin    Review of Systems   Review of Systems  HENT:  Positive for ear pain.   Negative except as per HPI.   Physical Exam Updated Vital Signs BP (!) 157/87   Pulse 79   Temp  98.1 F (36.7 C)   Resp 16   Ht 5' 2.5" (1.588 m)   Wt 61.7 kg   LMP 05/22/2018 (Within Days)   SpO2 98%   BMI 24.48 kg/m  Physical Exam Vitals and nursing note reviewed.  Constitutional:      Appearance: Normal appearance.  HENT:     Head: Normocephalic and atraumatic.     Right Ear: A middle ear effusion is present.     Left Ear: Tympanic membrane normal.     Mouth/Throat:     Mouth: Mucous membranes are moist.  Eyes:     General: No scleral icterus. Cardiovascular:     Rate and Rhythm: Normal rate  and regular rhythm.     Pulses: Normal pulses.     Heart sounds: Normal heart sounds.  Pulmonary:     Effort: Pulmonary effort is normal.     Breath sounds: Normal breath sounds.  Abdominal:     General: Abdomen is flat.     Palpations: Abdomen is soft.     Tenderness: There is no abdominal tenderness.  Musculoskeletal:        General: No deformity.  Skin:    General: Skin is warm.     Findings: No rash.  Neurological:     General: No focal deficit present.     Mental Status: She is alert.  Psychiatric:        Mood and Affect: Mood normal.     ED Results / Procedures / Treatments   Labs (all labs ordered are listed, but only abnormal results are displayed) Labs Reviewed  RESP PANEL BY RT-PCR (RSV, FLU A&B, COVID)  [id number]  GROUP A STREP BY PCR    EKG None  Radiology No results found.  Procedures Procedures    Medications Ordered in ED Medications - No data to display  ED Course/ Medical Decision Making/ A&P                           Medical Decision Making Risk Prescription drug management.   This patient presents to the ED for cough, right ear pain/fullness, this involves an extensive number of treatment options, and is a complaint that carries with a high risk of complications and morbidity.  The differential diagnosis includes flu, covid, rsv, acute otitis media, pharyngitis, bronchitis, pneumonia, infectious etiology.  This is not an exhaustive  list.  Lab tests: Viral panel negative. Strep test negative.  Imaging studies:  Problem list/ ED course/ Critical interventions/ Medical management: HPI: See above Vital signs within normal range and stable throughout visit. Laboratory/imaging studies significant for: See above. On physical examination, patient is afebrile and appears in no acute distress. Exam and history most consistent with AOM with mild R mid ear effusion. I have a low suspicion at this time for mastoiditis, malignant otitis externa, herpes or ramsey hunt syndrome, or retained foreign body. Will give wait and see prescription for azithromycin as patient is allergic to penicillin. If symptoms worsen or persist for 48-72 then pt to fill the prescription. Cautious return precautions discussed w/ full understanding. I have reviewed the patient home medicines and have made adjustments as needed.  Cardiac monitoring/EKG: The patient was maintained on a cardiac monitor.  I personally reviewed and interpreted the cardiac monitor which showed an underlying rhythm of: sinus rhythm.  Additional history obtained: External records from outside source obtained and reviewed including: Chart review including previous notes, labs, imaging.  Consultations obtained:  Disposition Continued outpatient therapy. Follow-up with PCP recommended for reevaluation of symptoms. Treatment plan discussed with patient.  Pt acknowledged understanding was agreeable to the plan. Worrisome signs and symptoms were discussed with patient, and patient acknowledged understanding to return to the ED if they noticed these signs and symptoms. Patient was stable upon discharge.   This chart was dictated using voice recognition software.  Despite best efforts to proofread,  errors can occur which can change the documentation meaning.          Final Clinical Impression(s) / ED Diagnoses Final diagnoses:  Right ear pain  Acute cough    Rx / DC  Orders ED Discharge Orders  Ordered    azithromycin (ZITHROMAX) 250 MG tablet  Daily        06/07/22 1340              Rex Kras, [place] 06/08/22 2057    Hayden Rasmussen, MD 06/09/22 [phone]

## 2022-06-13 ENCOUNTER — Encounter (HOSPITAL_COMMUNITY): Payer: Self-pay

## 2022-06-20 ENCOUNTER — Observation Stay (HOSPITAL_BASED_OUTPATIENT_CLINIC_OR_DEPARTMENT_OTHER)
Admission: EM | Admit: 2022-06-20 | Discharge: 2022-06-22 | Disposition: A | Discharge status: Home / Self Care | Payer: Medicare Other | Attending: Internal Medicine | Admitting: Internal Medicine

## 2022-06-20 ENCOUNTER — Encounter (HOSPITAL_BASED_OUTPATIENT_CLINIC_OR_DEPARTMENT_OTHER): Payer: Self-pay

## 2022-06-20 ENCOUNTER — Encounter (HOSPITAL_COMMUNITY): Payer: Self-pay

## 2022-06-20 ENCOUNTER — Emergency Department (HOSPITAL_BASED_OUTPATIENT_CLINIC_OR_DEPARTMENT_OTHER): Payer: Medicare Other

## 2022-06-20 ENCOUNTER — Other Ambulatory Visit: Payer: Self-pay

## 2022-06-20 DIAGNOSIS — I10 Essential (primary) hypertension: Secondary | ICD-10-CM

## 2022-06-20 DIAGNOSIS — N184 Chronic kidney disease, stage 4 (severe): Secondary | ICD-10-CM | POA: Diagnosis not present

## 2022-06-20 DIAGNOSIS — G9340 Encephalopathy, unspecified: Secondary | ICD-10-CM | POA: Diagnosis not present

## 2022-06-20 DIAGNOSIS — R4182 Altered mental status, unspecified: Secondary | ICD-10-CM | POA: Diagnosis present

## 2022-06-20 DIAGNOSIS — I129 Hypertensive chronic kidney disease with stage 1 through stage 4 chronic kidney disease, or unspecified chronic kidney disease: Secondary | ICD-10-CM | POA: Insufficient documentation

## 2022-06-20 DIAGNOSIS — G459 Transient cerebral ischemic attack, unspecified: Principal | ICD-10-CM | POA: Diagnosis present

## 2022-06-20 DIAGNOSIS — Z79899 Other long term (current) drug therapy: Secondary | ICD-10-CM | POA: Insufficient documentation

## 2022-06-20 DIAGNOSIS — Z7982 Long term (current) use of aspirin: Secondary | ICD-10-CM | POA: Insufficient documentation

## 2022-06-20 DIAGNOSIS — J45909 Unspecified asthma, uncomplicated: Secondary | ICD-10-CM | POA: Insufficient documentation

## 2022-06-20 DIAGNOSIS — E1122 Type 2 diabetes mellitus with diabetic chronic kidney disease: Secondary | ICD-10-CM | POA: Diagnosis not present

## 2022-06-20 LAB — DIFFERENTIAL
Abs Immature Granulocytes: 0.01 10*3/uL (ref 0.00–0.07)
Basophils Absolute: 0.1 10*3/uL (ref 0.0–0.1)
Basophils Relative: 1 %
Eosinophils Absolute: 0.1 10*3/uL (ref 0.0–0.5)
Eosinophils Relative: 2 %
Immature Granulocytes: 0 %
Lymphocytes Relative: 33 %
Lymphs Abs: 1.8 10*3/uL (ref 0.7–4.0)
Monocytes Absolute: 0.2 10*3/uL (ref 0.1–1.0)
Monocytes Relative: 4 %
Neutro Abs: 3.3 10*3/uL (ref 1.7–7.7)
Neutrophils Relative %: 60 %

## 2022-06-20 LAB — COMPREHENSIVE METABOLIC PANEL
ALT: 10 U/L (ref 0–44)
AST: 13 U/L — ABNORMAL LOW (ref 15–41)
Albumin: 4.2 g/dL (ref 3.5–5.0)
Alkaline Phosphatase: 48 U/L (ref 38–126)
Anion gap: 11 (ref 5–15)
BUN: 27 mg/dL — ABNORMAL HIGH (ref 6–20)
CO2: 23 mmol/L (ref 22–32)
Calcium: 10.4 mg/dL — ABNORMAL HIGH (ref 8.9–10.3)
Chloride: 108 mmol/L (ref 98–111)
Creatinine, Ser: 2.59 mg/dL — ABNORMAL HIGH (ref 0.44–1.00)
GFR, Estimated: 22 mL/min — ABNORMAL LOW (ref >60)
Glucose, Bld: 98 mg/dL (ref 70–99)
Potassium: 3.6 mmol/L (ref 3.5–5.1)
Sodium: 142 mmol/L (ref 135–145)
Total Bilirubin: 0.4 mg/dL (ref 0.3–1.2)
Total Protein: 8 g/dL (ref 6.5–8.1)

## 2022-06-20 LAB — CBC
HCT: 34.2 % — ABNORMAL LOW (ref 36.0–46.0)
Hemoglobin: 11.1 g/dL — ABNORMAL LOW (ref 12.0–15.0)
MCH: 28.7 pg (ref 26.0–34.0)
MCHC: 32.5 g/dL (ref 30.0–36.0)
MCV: 88.4 fL (ref 80.0–100.0)
Platelets: 329 10*3/uL (ref 150–400)
RBC: 3.87 MIL/uL (ref 3.87–5.11)
RDW: 13.8 % (ref 11.5–15.5)
WBC: 5.5 10*3/uL (ref 4.0–10.5)
nRBC: 0 % (ref 0.0–0.2)

## 2022-06-20 LAB — ETHANOL: Alcohol, Ethyl (B): <10 mg/dL (ref <10)

## 2022-06-20 LAB — CBG MONITORING, ED: Glucose-Capillary: 90 mg/dL (ref 70–99)

## 2022-06-20 LAB — APTT: aPTT: 28 seconds (ref 24–36)

## 2022-06-20 LAB — PROTIME-INR
INR: 1 (ref 0.8–1.2)
Prothrombin Time: 13.1 seconds (ref 11.4–15.2)

## 2022-06-20 MED ORDER — LABETALOL HCL 5 MG/ML IV SOLN
20.0000 mg | Freq: Once | INTRAVENOUS | Status: AC
  Administered 2022-06-20: 20 mg via INTRAVENOUS
  Filled 2022-06-20: qty 4

## 2022-06-20 NOTE — ED Triage Notes (Signed)
Patient here POV from Home.  Endorses driving Home from [place] today at 1630 when she began to experience Memory Loss in that she forgot how to drive. Does endorse Right Hand Numbness that began at 1630 that also subsided.   CVA 6 Months ago. Endorses Use of Brilinta.   NAD Noted during Triage. A&Ox4. GCS 15. Ambulatory with [facility].
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
## 2022-06-21 ENCOUNTER — Observation Stay (HOSPITAL_COMMUNITY): Payer: Medicare Other

## 2022-06-21 ENCOUNTER — Observation Stay (HOSPITAL_BASED_OUTPATIENT_CLINIC_OR_DEPARTMENT_OTHER): Payer: Medicare Other

## 2022-06-21 ENCOUNTER — Encounter (HOSPITAL_COMMUNITY): Payer: Self-pay

## 2022-06-21 DIAGNOSIS — R4182 Altered mental status, unspecified: Secondary | ICD-10-CM | POA: Diagnosis present

## 2022-06-21 DIAGNOSIS — N184 Chronic kidney disease, stage 4 (severe): Secondary | ICD-10-CM | POA: Diagnosis not present

## 2022-06-21 DIAGNOSIS — Z7982 Long term (current) use of aspirin: Secondary | ICD-10-CM | POA: Diagnosis not present

## 2022-06-21 DIAGNOSIS — G459 Transient cerebral ischemic attack, unspecified: Secondary | ICD-10-CM

## 2022-06-21 DIAGNOSIS — I129 Hypertensive chronic kidney disease with stage 1 through stage 4 chronic kidney disease, or unspecified chronic kidney disease: Secondary | ICD-10-CM | POA: Diagnosis not present

## 2022-06-21 DIAGNOSIS — E1122 Type 2 diabetes mellitus with diabetic chronic kidney disease: Secondary | ICD-10-CM | POA: Diagnosis not present

## 2022-06-21 DIAGNOSIS — G9340 Encephalopathy, unspecified: Secondary | ICD-10-CM | POA: Diagnosis not present

## 2022-06-21 DIAGNOSIS — J45909 Unspecified asthma, uncomplicated: Secondary | ICD-10-CM | POA: Diagnosis not present

## 2022-06-21 DIAGNOSIS — Z79899 Other long term (current) drug therapy: Secondary | ICD-10-CM | POA: Diagnosis not present

## 2022-06-21 LAB — LIPID PANEL
Cholesterol: 144 mg/dL (ref 0–200)
HDL: 49 mg/dL (ref >40)
LDL Cholesterol: 75 mg/dL (ref 0–99)
Total CHOL/HDL Ratio: 2.9 RATIO
Triglycerides: 101 mg/dL (ref <150)
VLDL: 20 mg/dL (ref 0–40)

## 2022-06-21 LAB — HEMOGLOBIN A1C
Hgb A1c MFr Bld: 5.9 % — ABNORMAL HIGH (ref 4.8–5.6)
Mean Plasma Glucose: 122.63 mg/dL

## 2022-06-21 LAB — ECHOCARDIOGRAM COMPLETE
Area-P 1/2: 3.05 cm2
Height: 62 in
S' Lateral: 1.7 cm
Weight: 2044.8 oz

## 2022-06-21 LAB — GLUCOSE, CAPILLARY
Glucose-Capillary: 87 mg/dL (ref 70–99)
Glucose-Capillary: 108 mg/dL — ABNORMAL HIGH (ref 70–99)
Glucose-Capillary: 113 mg/dL — ABNORMAL HIGH (ref 70–99)

## 2022-06-21 MED ORDER — AMLODIPINE BESYLATE 5 MG PO TABS
5.0000 mg | ORAL_TABLET | Freq: Every day | ORAL | Status: DC
  Administered 2022-06-21 – 2022-06-22 (×2): 5 mg via ORAL
  Filled 2022-06-21 (×2): qty 1

## 2022-06-21 MED ORDER — MONTELUKAST SODIUM 10 MG PO TABS: 10.0000 mg | ORAL_TABLET | Freq: Every day | ORAL | Status: DC

## 2022-06-21 MED ORDER — INSULIN ASPART 100 UNIT/ML IJ SOLN: 0.0000 [IU] | Freq: Three times a day (TID) | INTRAMUSCULAR | Status: DC

## 2022-06-21 MED ORDER — ACETAMINOPHEN 325 MG PO TABS: 650.0000 mg | ORAL_TABLET | Freq: Four times a day (QID) | ORAL | Status: DC | PRN

## 2022-06-21 MED ORDER — ONDANSETRON HCL 4 MG PO TABS: 4.0000 mg | ORAL_TABLET | Freq: Four times a day (QID) | ORAL | Status: DC | PRN

## 2022-06-21 MED ORDER — ACETAMINOPHEN 650 MG RE SUPP: 650.0000 mg | Freq: Four times a day (QID) | RECTAL | Status: DC | PRN

## 2022-06-21 MED ORDER — ASPIRIN 81 MG PO TBEC
81.0000 mg | DELAYED_RELEASE_TABLET | Freq: Every day | ORAL | Status: DC
  Administered 2022-06-21 – 2022-06-22 (×2): 81 mg via ORAL
  Filled 2022-06-21 (×2): qty 1

## 2022-06-21 MED ORDER — DULOXETINE HCL 60 MG PO CPEP
60.0000 mg | ORAL_CAPSULE | Freq: Every day | ORAL | Status: DC
  Administered 2022-06-21: 60 mg via ORAL
  Filled 2022-06-21: qty 1

## 2022-06-21 MED ORDER — ATORVASTATIN CALCIUM 80 MG PO TABS
80.0000 mg | ORAL_TABLET | Freq: Every day | ORAL | Status: DC
  Administered 2022-06-21: 80 mg via ORAL
  Filled 2022-06-21: qty 1

## 2022-06-21 MED ORDER — HYDRALAZINE HCL 10 MG PO TABS
10.0000 mg | ORAL_TABLET | Freq: Four times a day (QID) | ORAL | Status: DC | PRN
  Administered 2022-06-21: 10 mg via ORAL
  Filled 2022-06-21: qty 1

## 2022-06-21 MED ORDER — ALBUTEROL SULFATE (2.5 MG/3ML) 0.083% IN NEBU: 2.5000 mg | INHALATION_SOLUTION | Freq: Four times a day (QID) | RESPIRATORY_TRACT | Status: DC | PRN

## 2022-06-21 MED ORDER — SEMAGLUTIDE(0.25 OR 0.5MG/DOS) 2 MG/3ML ~~LOC~~ SOPN: 0.5000 mg | PEN_INJECTOR | SUBCUTANEOUS | Status: DC

## 2022-06-21 MED ORDER — INSULIN ASPART 100 UNIT/ML IJ SOLN: 0.0000 [IU] | Freq: Every day | INTRAMUSCULAR | Status: DC

## 2022-06-21 MED ORDER — ONDANSETRON HCL 4 MG/2ML IJ SOLN: 4.0000 mg | Freq: Four times a day (QID) | INTRAMUSCULAR | Status: DC | PRN

## 2022-06-21 MED ORDER — SODIUM CHLORIDE 0.9% FLUSH
3.0000 mL | Freq: Two times a day (BID) | INTRAVENOUS | Status: DC
  Administered 2022-06-21 – 2022-06-22 (×3): 3 mL via INTRAVENOUS

## 2022-06-21 MED ORDER — CARVEDILOL 12.5 MG PO TABS
12.5000 mg | ORAL_TABLET | Freq: Two times a day (BID) | ORAL | Status: DC
  Administered 2022-06-21 – 2022-06-22 (×3): 12.5 mg via ORAL
  Filled 2022-06-21 (×3): qty 1

## 2022-06-21 MED ORDER — TRAZODONE HCL 50 MG PO TABS: 50.0000 mg | ORAL_TABLET | Freq: Every evening | ORAL | Status: DC | PRN

## 2022-06-21 MED ORDER — PREDNISOLONE ACETATE 1 % OP SUSP: 1.0000 [drp] | Freq: Four times a day (QID) | OPHTHALMIC | Status: DC

## 2022-06-21 MED ORDER — TICAGRELOR 90 MG PO TABS
90.0000 mg | ORAL_TABLET | Freq: Two times a day (BID) | ORAL | Status: DC
  Administered 2022-06-21 – 2022-06-22 (×3): 90 mg via ORAL
  Filled 2022-06-21 (×3): qty 1

## 2022-06-21 MED ORDER — PANTOPRAZOLE SODIUM 40 MG PO TBEC
40.0000 mg | DELAYED_RELEASE_TABLET | Freq: Every day | ORAL | Status: DC
  Administered 2022-06-21 – 2022-06-22 (×2): 40 mg via ORAL
  Filled 2022-06-21 (×2): qty 1

## 2022-06-21 MED ORDER — ENOXAPARIN SODIUM 30 MG/0.3ML IJ SOSY
30.0000 mg | PREFILLED_SYRINGE | Freq: Every day | INTRAMUSCULAR | Status: DC
  Administered 2022-06-21 – 2022-06-22 (×2): 30 mg via SUBCUTANEOUS
  Filled 2022-06-21 (×2): qty 0.3

## 2022-06-21 NOTE — Progress Notes (Signed)
EEG complete - results pending 

## 2022-06-21 NOTE — ED Notes (Signed)
Verbal report given to Corliss Blacker, RN (3W).
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
## 2022-06-21 NOTE — ED Notes (Signed)
Called 3W to give report. Charge RN stated that she was going to review chart. Charge RN made aware that [facility] was on the way to get patient.

## 2022-06-21 NOTE — Progress Notes (Signed)
Echocardiogram 2D Echocardiogram has been performed.  Oneal Deputy Hanin [place] RDCS 06/21/2022, 2:50 PM

## 2022-06-21 NOTE — Procedures (Signed)
Patient Name: Christina Orozco  MRN: [id number]  Epilepsy Attending: Lora Havens  Referring Physician/Provider: Samella Parr, NP  Date: 06/21/2022 Duration: 22.34 mins  Patient history: 52yo F with ams. EEG to evaluate for seizure.  Level of alertness: Awake  AEDs during EEG study: None  Technical aspects: This EEG study was done with scalp electrodes positioned according to the 10-20 International system of electrode placement. Electrical activity was reviewed with band pass filter of 1-70Hz , sensitivity of 7 uV/mm, display speed of 56mm/sec with a 60Hz  notched filter applied as appropriate. EEG data were recorded continuously and digitally stored.  Video monitoring was available and reviewed as appropriate.  Description: The posterior dominant rhythm consists of 8 Hz activity of moderate voltage (25-35 uV) seen predominantly in posterior head regions, symmetric and reactive to eye opening and eye closing.  Hyperventilation and photic stimulation were not performed.     IMPRESSION: This study is within normal limits. No seizures or epileptiform discharges were seen throughout the recording.  Antoniette Peake Barbra Sarks

## 2022-06-21 NOTE — Progress Notes (Signed)
Carotid duplex bilateral study completed.   Please see CV Proc for preliminary results.   Eshika Reckart, RDMS, RVT  

## 2022-06-21 NOTE — Evaluation (Signed)
Physical Therapy Evaluation Patient Details Name: Christina Orozco MRN: [id number] DOB: 03-03-1971 Today's Date: 06/21/2022  History of Present Illness  Pt is 52 yo female who presented 06/20/22 with AMS with possible TIA.  MRI negative for acute changes.  Pt with hx including but not limited to DM, HTN, CKD, anemia, PVD, prior CVAs  Clinical Impression  Pt admitted with above diagnosis. At baseline, pt resides alone and is ambulatory for short community distances with a cane but does report frequent falls.  She reports she is able to do ADLs and IADLs but it is difficulty.  Pt with limited daily support as daughter in college and reports she is trying to get PCA back (had in past).  Today, pt ambulated 60' with RW, she attempted with HHA but was unsteady.  Her strength was good throughout extremities.  Pt reports feeling close to baseline just slight decrease in leg endurance.  States her balance is her baseline. Pt declines need for HHPT at d/c.  Will maintain on caseload while hospitalized to maximize function as pt lives alone and recommended use of RW at home -pt acknowledged.   Pt currently with functional limitations due to the deficits listed below (see PT Problem List). Pt will benefit from skilled PT to increase their independence and safety with mobility to allow discharge to the venue listed below.          Recommendations for follow up therapy are one component of a multi-disciplinary discharge planning process, led by the attending physician.  Recommendations may be updated based on patient status, additional functional criteria and insurance authorization.  Follow Up Recommendations No PT follow up      Assistance Recommended at Discharge Intermittent Supervision/Assistance  Patient can return home with the following  Assistance with cooking/housework;Help with stairs or ramp for entrance    Equipment Recommendations None recommended by PT  Recommendations for Other Services        Functional Status Assessment Patient has had a recent decline in their functional status and demonstrates the ability to make significant improvements in function in a reasonable and predictable amount of time. (only slight decline)     Precautions / Restrictions Precautions Precautions: Fall      Mobility  Bed Mobility Overal bed mobility: Needs Assistance Bed Mobility: Rolling, Supine to Sit, Sit to Supine Rolling: Modified independent (Device/Increase time)   Supine to sit: Modified independent (Device/Increase time) Sit to supine: Modified independent (Device/Increase time)        Transfers Overall transfer level: Needs assistance Equipment used: None Transfers: Sit to/from Stand Sit to Stand: Supervision                Ambulation/Gait Ambulation/Gait assistance: Supervision Gait Distance (Feet): 80 Feet Assistive device: Rolling walker (2 wheels) Gait Pattern/deviations: Step-through pattern, Wide base of support Gait velocity: decreased     General Gait Details: Pt was walking from bathroom to bed with RN at arrival with HHA and was unsteady; ambulated with RW during therapy with improved stability and no loss of balance.  Pt reports legs feel slightly weaker than normal but balance is her baseline  Science writer    Modified Rankin (Stroke Patients Only) Modified Rankin (Stroke Patients Only) Pre-Morbid Rankin Score: Moderate disability Modified Rankin: Moderate disability     Balance Overall balance assessment: Needs assistance, History of Falls Sitting-balance support: No upper extremity supported Sitting balance-Leahy Scale: Normal  Standing balance-Leahy Scale: Fair Standing balance comment: Could static stand without AD but with any challenges needed UE support                             Pertinent Vitals/Pain Pain Assessment Pain Assessment: No/denies pain    Home Living Family/patient  expects to be discharged to:: Private residence Living Arrangements: Alone (daughter in college) Available Help at Discharge: Family;Available PRN/intermittently Type of Home: Apartment Home Access: Level entry       Home Layout: One level Home Equipment: Conservation officer, nature (2 wheels);Cane - single point;Shower seat;Grab bars - tub/shower;Hand held shower head;Grab bars - toilet Additional Comments: trying to get a PCA back    Prior Function               Mobility Comments: Uses cane to ambulate; could only do short community distance; reports falls about once a month ADLs Comments: Was able to do adls and light iadls (fix sandwiches and salads) but reports was difficult and she is trying to get PCA (had one in past)     Hand Dominance   Dominant Hand: Right    Extremity/Trunk Assessment   Upper Extremity Assessment Upper Extremity Assessment: Overall WFL for tasks assessed (ROM WFL; MMT 5/5 throughout)    Lower Extremity Assessment Lower Extremity Assessment: Overall WFL for tasks assessed (ROM WFL; MMT 5/5 throughout)    Cervical / Trunk Assessment Cervical / Trunk Assessment: Normal  Communication   Communication: Expressive difficulties (slow speech)  Cognition Arousal/Alertness: Awake/alert Behavior During Therapy: Flat affect Overall Cognitive Status: Within Functional Limits for tasks assessed                                          General Comments      Exercises     Assessment/Plan    PT Assessment Patient needs continued PT services  PT Problem List Decreased strength;Decreased activity tolerance;Decreased balance;Decreased safety awareness;Decreased mobility       PT Treatment Interventions DME instruction;Therapeutic exercise;Gait training;Balance training;Functional mobility training;Therapeutic activities;Patient/family education;Stair training;Neuromuscular re-education    PT Goals (Current goals can be found in the Care Plan  section)  Acute Rehab PT Goals Patient Stated Goal: return home PT Goal Formulation: With patient Time For Goal Achievement: 07/05/22 Potential to Achieve Goals: Good    Frequency Min 3X/week     Co-evaluation               AM-PAC PT "6 Clicks" Mobility  Outcome Measure Help needed turning from your back to your side while in a flat bed without using bedrails?: None Help needed moving from lying on your back to sitting on the side of a flat bed without using bedrails?: None Help needed moving to and from a bed to a chair (including a wheelchair)?: A Little Help needed standing up from a chair using your arms (e.g., wheelchair or bedside chair)?: A Little Help needed to walk in hospital room?: A Little Help needed climbing 3-5 steps with a railing? : A Little 6 Click Score: 20    End of Session Equipment Utilized During Treatment: Gait belt Activity Tolerance: Patient tolerated treatment well Patient left: in bed;with call bell/phone within reach;with bed alarm set Nurse Communication: Mobility status PT Visit Diagnosis: History of falling (Z91.81);Other abnormalities of gait and mobility (R26.89)    Time:  3818-4037 PT Time Calculation (min) (ACUTE ONLY): 20 min   Charges:   PT Evaluation $PT Eval Low Complexity: 1 Low          Neldon Shepard, PT Acute Rehab [facility] Rehab [phone]   Karlton Lemon 06/21/2022, 5:36 PM
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
## 2022-06-21 NOTE — Progress Notes (Signed)
Christina Orozco [phone] Daughter number left for any questions or concerns.

## 2022-06-22 DIAGNOSIS — F05 Delirium due to known physiological condition: Secondary | ICD-10-CM

## 2022-06-22 DIAGNOSIS — G459 Transient cerebral ischemic attack, unspecified: Secondary | ICD-10-CM | POA: Diagnosis not present

## 2022-06-22 DIAGNOSIS — I10 Essential (primary) hypertension: Secondary | ICD-10-CM | POA: Diagnosis not present

## 2022-06-22 LAB — BASIC METABOLIC PANEL
Anion gap: 9 (ref 5–15)
BUN: 32 mg/dL — ABNORMAL HIGH (ref 6–20)
CO2: 25 mmol/L (ref 22–32)
Calcium: 9.3 mg/dL (ref 8.9–10.3)
Chloride: 108 mmol/L (ref 98–111)
Creatinine, Ser: 2.46 mg/dL — ABNORMAL HIGH (ref 0.44–1.00)
GFR, Estimated: 23 mL/min — ABNORMAL LOW (ref >60)
Glucose, Bld: 92 mg/dL (ref 70–99)
Potassium: 3.4 mmol/L — ABNORMAL LOW (ref 3.5–5.1)
Sodium: 142 mmol/L (ref 135–145)

## 2022-06-22 LAB — RAPID URINE DRUG SCREEN, HOSP PERFORMED
Amphetamines: NOT DETECTED
Barbiturates: NOT DETECTED
Benzodiazepines: NOT DETECTED
Cocaine: NOT DETECTED
Opiates: NOT DETECTED
Tetrahydrocannabinol: NOT DETECTED

## 2022-06-22 LAB — GLUCOSE, CAPILLARY
Glucose-Capillary: 101 mg/dL — ABNORMAL HIGH (ref 70–99)
Glucose-Capillary: 98 mg/dL (ref 70–99)

## 2022-06-22 NOTE — TOC Transition Note (Signed)
Transition of Care [facility]) - CM/SW Discharge Note   Patient Details  Name: Christina Orozco MRN: [id number] Date of Birth: 10/19/1970  Transition of [facility]) CM/SW Contact:  Pollie Friar, RN Phone Number: 06/22/2022, 10:14 AM   Clinical Narrative:    Pt is discharging home with self care. No needs per TOC. Pt has transportation home. She is going to have her home pharmacy [facility]) start doing the bubble packets. They already deliver her meds to her home.  Pt uses medicaid transport for appointments.    Final next level of care: Home/Self Care Barriers to Discharge: No Barriers Identified   Patient Goals and CMS Choice      Discharge Placement                         Discharge Plan and Services Additional resources added to the After Visit Summary for                                       Social Determinants of Health (SDOH) Interventions SDOH Screenings   Food Insecurity: No Food Insecurity (06/21/2022)  Housing: Low Risk  (06/21/2022)  Transportation Needs: No Transportation Needs (06/21/2022)  Utilities: Not At Risk (06/21/2022)  Depression (PHQ2-9): Low Risk  (04/27/2022)  Tobacco Use: Low Risk  (06/20/2022)     Readmission Risk Interventions     No data to display

## 2022-06-22 NOTE — Evaluation (Signed)
Occupational Therapy Evaluation Patient Details Name: Christina Orozco MRN: [id number] DOB: 07/07/1970 Today's Date: 06/22/2022   History of Present Illness Pt is 52 yo female who presented 06/20/22 with AMS with possible TIA.  MRI negative for acute changes.  Pt with hx including but not limited to DM, HTN, CKD, anemia, PVD, prior CVAs   Clinical Impression   Pt presents with problem above with deficits listed below. Upon eval, pt performing ADL at supervision/set-up level. Pt demonstrating basic problem solving and memory skills during session and reporting she plans not to drive until she is feeling 100% back to baseline. Suspect pt nearing baseline at this time, however, did encourage her to seek supervision for several days during cognitive tasks. Offering OP OT recommendation to optimize cognition, but pt reporting she will reach out to MD with any difficulty for return to occupation.      Recommendations for follow up therapy are one component of a multi-disciplinary discharge planning process, led by the attending physician.  Recommendations may be updated based on patient status, additional functional criteria and insurance authorization.   Follow Up Recommendations  No OT follow up     Assistance Recommended at Discharge Intermittent Supervision/Assistance  Patient can return home with the following A little help with walking and/or transfers;Assistance with cooking/housework;Direct supervision/assist for medications management;Direct supervision/assist for financial management;Help with stairs or ramp for entrance;Assist for transportation    Functional Status Assessment  Patient has had a recent decline in their functional status and demonstrates the ability to make significant improvements in function in a reasonable and predictable amount of time.  Equipment Recommendations  None recommended by OT    Recommendations for Other Services       Precautions / Restrictions  Precautions Precautions: Fall Restrictions Weight Bearing Restrictions: No      Mobility Bed Mobility Overal bed mobility: Needs Assistance Bed Mobility: Rolling, Supine to Sit, Sit to Supine Rolling: Modified independent (Device/Increase time)   Supine to sit: Modified independent (Device/Increase time) Sit to supine: Modified independent (Device/Increase time)        Transfers Overall transfer level: Needs assistance Equipment used: None Transfers: Sit to/from Stand Sit to Stand: Supervision                  Balance Overall balance assessment: Needs assistance, History of Falls Sitting-balance support: No upper extremity supported Sitting balance-Leahy Scale: Normal       Standing balance-Leahy Scale: Fair                             ADL either performed or assessed with clinical judgement   ADL Overall ADL's : Needs assistance/impaired Eating/Feeding: Independent   Grooming: Supervision/safety   Upper Body Bathing: Set up   Lower Body Bathing: Set up   Upper Body Dressing : Set up   Lower Body Dressing: Set up   Toilet Transfer: Supervision/safety           Functional mobility during ADLs: Supervision/safety       Vision Patient Visual Report: No change from baseline Vision Assessment?: No apparent visual deficits     Perception     Praxis      Pertinent Vitals/Pain Pain Assessment Pain Assessment: No/denies pain     Hand Dominance Right   Extremity/Trunk Assessment Upper Extremity Assessment Upper Extremity Assessment: Overall WFL for tasks assessed   Lower Extremity Assessment Lower Extremity Assessment: Defer to PT evaluation   Cervical /  Trunk Assessment Cervical / Trunk Assessment: Normal   Communication Communication Communication: Expressive difficulties (minimally slowed speech)   Cognition Arousal/Alertness: Awake/alert Behavior During Therapy: Flat affect Overall Cognitive Status: Within  Functional Limits for tasks assessed                                 General Comments: EFL for basic tasks. Demonstrating problem solving when referring to daughters birthday in relation to current day. Oriented and following all commands.     General Comments  VSS    Exercises     Shoulder Instructions      Home Living Family/patient expects to be discharged to:: Private residence Living Arrangements: Alone (daughter in college at [employer]) Available Help at Discharge: Family;Available PRN/intermittently Type of Home: Apartment Home Access: Level entry     Home Layout: One level     Bathroom Shower/Tub: Tub/shower unit;Curtain   Bathroom Toilet: Handicapped height     Home Equipment: Conservation officer, nature (2 wheels);Cane - single point;Shower seat;Grab bars - tub/shower;Hand held shower head;Grab bars - toilet   Additional Comments: trying to get a PCA back      Prior Functioning/Environment Prior Level of Function : Needs assist             Mobility Comments: Uses cane to ambulate; could only do short community distance; reports falls about once a month ADLs Comments: Was able to do adls and light iadls (fix sandwiches and salads) but reports was difficult and she is trying to get PCA (had one in past)        OT Problem List: Decreased strength;Decreased activity tolerance;Impaired balance (sitting and/or standing);Decreased cognition;Decreased safety awareness      OT Treatment/Interventions: Self-care/ADL training;Therapeutic exercise;DME and/or AE instruction;Cognitive remediation/compensation;Patient/family education;Balance training    OT Goals(Current goals can be found in the care plan section) Acute Rehab OT Goals Patient Stated Goal: go home OT Goal Formulation: With patient Time For Goal Achievement: 07/06/22 Potential to Achieve Goals: Good  OT Frequency: Min 2X/week    Co-evaluation              AM-PAC OT "6 Clicks" Daily Activity      Outcome Measure Help from another person eating meals?: None Help from another person taking care of personal grooming?: A Little Help from another person toileting, which includes using toliet, bedpan, or urinal?: A Little Help from another person bathing (including washing, rinsing, drying)?: A Little Help from another person to put on and taking off regular upper body clothing?: A Little Help from another person to put on and taking off regular lower body clothing?: A Little 6 Click Score: 19   End of Session Nurse Communication: Mobility status  Activity Tolerance: Patient tolerated treatment well Patient left: in bed;with call bell/phone within reach  OT Visit Diagnosis: Unsteadiness on feet (R26.81);Muscle weakness (generalized) (M62.81);Other symptoms and signs involving cognitive function                Time: 1308-6578 OT Time Calculation (min): 18 min Charges:  OT General Charges $OT Visit: 1 Visit OT Evaluation $OT Eval Low Complexity: 1 Low  Elder Cyphers, OTR/L [facility] Acute Rehabilitation Office: [phone]   Magnus Ivan 06/22/2022, 12:29 PM

## 2022-06-22 NOTE — Discharge Summary (Signed)
Physician Discharge Summary  Christina Orozco BOF:[id number] DOB: 1970/12/10 DOA: 06/20/2022  PCP: Jenel Lucks, PA-C  Admit date: 06/20/2022 Discharge date: 06/22/2022  Admitted From: Home Disposition:  Home  Recommendations for Outpatient Follow-up:  Follow up with PCP in 1-2 weeks Please obtain BMP/CBC in one week   Home Health:No Equipment/Devices:None  Discharge Condition:Stable CODE STATUS:Full Diet recommendation: Heart Healthy  Brief/Interim Summary: 52 year old possible closure of diabetes mellitus hypertension chronic kidney disease, prior history of CVA most recent in 2023 woke up took her daughter to school when she was about to go back to [facility] she was confused was not able to park call call family and they brought her to the ED blood pressure was 187/11, CT of the head showed no acute findings MRI showed no acute CVA.   Discharge Diagnoses:  Principal Problem:   TIA (transient ischemic attack)  Acute metabolic encephalopathy [name] due to TIA: Her home medications were resumed MRI of the brain showed no CVA. 2D echo was done that showed preserved EF no wall motion abnormality. Carotid Doppler showed no significant stenosis. Neurology was consulted who recommended this possibly a TIA they recommended an EEG that showed no signs of seizures. They recommended to continue aspirin and Brilinta as an outpatient. Physical therapy evaluated the patient on home health PT.  Uncontrolled hypertension: No change made to her medication continue her home regimen.  Diabetes mellitus type 2: No change made to her medication.  Chronic kidney disease stage IV: Her creatinine remained at baseline.  Anxiety and depression: Continue Cymbalta and trazodone.  Asthma: No changes made continue inhalers..     Discharge Instructions  Discharge Instructions     Diet - low sodium heart healthy   Complete by: As directed    Increase activity slowly    Complete by: As directed       Allergies as of 06/22/2022       Reactions   Lisinopril Swelling, Other (See Comments)   Facial swelling  angioedema   Penicillins Hives   Has patient had a PCN reaction causing immediate rash, facial/tongue/throat swelling, SOB or lightheadedness with hypotension: yes Has patient had a PCN reaction causing severe rash involving mucus membranes or skin necrosis: No Has patient had a PCN reaction that required hospitalization No Has patient had a PCN reaction occurring within the last 10 years: No If all of the above answers are "NO", then may proceed wit Rash on buttock that makes sitting very difficult   Quetiapine Other (See Comments)   Unknown    Gabapentin Swelling, Other (See Comments)   "I thought I was having another stroke." dizziness        Medication List     TAKE these medications    acetaminophen 325 MG tablet Commonly known as: TYLENOL Take 1-2 tablets (325-650 mg total) by mouth every 4 (four) hours as needed for mild pain. What changed:  how much to take when to take this   albuterol 108 (90 Base) MCG/ACT inhaler Commonly known as: ProAir HFA INHALE 1 OR 2 PUFFS INTO THE LUNGS EVERY 6 HOURS AS NEEDED FOR WHEEZING OR SHORTNESS OF BREATH What changed:  how much to take how to take this when to take this reasons to take this additional instructions   amLODipine 5 MG tablet Commonly known as: NORVASC Take 1 tablet (5 mg total) by mouth daily.   aspirin EC 81 MG tablet Take 1 tablet (81 mg total) by mouth daily.   atorvastatin 80 MG  tablet Commonly known as: LIPITOR Take 80 mg by mouth at bedtime.   azithromycin 250 MG tablet Commonly known as: ZITHROMAX Take 1 tablet (250 mg total) by mouth daily. Take first 2 tablets together, then 1 every day until finished.   carboxymethylcellulose 0.5 % Soln Commonly known as: REFRESH PLUS Place 1 drop into both eyes daily.   carvedilol 12.5 MG tablet Commonly known as:  COREG Take 1 tablet (12.5 mg total) by mouth 2 (two) times daily with a meal.   diclofenac Sodium 1 % Gel Commonly known as: VOLTAREN Apply 1 Application topically daily as needed (pain).   DULoxetine 60 MG capsule Commonly known as: CYMBALTA Take 60 mg by mouth at bedtime.   fluticasone 50 MCG/ACT nasal spray Commonly known as: FLONASE Place 1 spray into both nostrils as needed for allergies.   glucose blood test strip Commonly known as: Contour Next Test 1 each by Other route 2 (two) times daily. And lancets 2/day What changed: additional instructions   montelukast 10 MG tablet Commonly known as: SINGULAIR Take 10 mg by mouth at bedtime.   Ozempic (0.25 or 0.5 MG/DOSE) 2 MG/3ML Sopn Generic drug: Semaglutide(0.25 or 0.5MG /DOS) Inject 0.5 mg into the skin See admin instructions. Inject 0.5 mg subcutaneous every 10 days   pantoprazole 40 MG tablet Commonly known as: PROTONIX Take 1 tablet (40 mg total) by mouth daily.   prednisoLONE acetate 1 % ophthalmic suspension Commonly known as: PRED FORTE INSTILL 1 DROP INTO THE RIGHT EYE 4 TIMES EVERY DAY   ticagrelor 90 MG Tabs tablet Commonly known as: BRILINTA Take 90 mg by mouth in the morning and at bedtime.   traZODone 50 MG tablet Commonly known as: DESYREL Take 1 tablet (50 mg total) by mouth at bedtime as needed for sleep.   VITAMIN B-12 PO Take 2 capsules by mouth daily.        Allergies  Allergen Reactions   Lisinopril Swelling and Other (See Comments)    Facial swelling  angioedema   Penicillins Hives    Has patient had a PCN reaction causing immediate rash, facial/tongue/throat swelling, SOB or lightheadedness with hypotension: yes Has patient had a PCN reaction causing severe rash involving mucus membranes or skin necrosis: No Has patient had a PCN reaction that required hospitalization No Has patient had a PCN reaction occurring within the last 10 years: No If all of the above answers are "NO", then  may proceed wit Rash on buttock that makes sitting very difficult   Quetiapine Other (See Comments)    Unknown    Gabapentin Swelling and Other (See Comments)    "I thought I was having another stroke." dizziness    Consultations: Neurology   Procedures/Studies: ECHOCARDIOGRAM COMPLETE  Result Date: 06/21/2022    ECHOCARDIOGRAM REPORT   Patient Name:   DANNETTA LEKAS Date of Exam: 06/21/2022 Medical Rec #:  [id number]        Height:       62.0 in Accession #:    [id number]       Weight:       127.8 lb Date of Birth:  28-Jul-1970       BSA:          1.580 m Patient Age:    5 years         BP:           175/96 mmHg Patient Gender: F  HR:           88 bpm. Exam Location:  Inpatient Procedure: 2D Echo, Color Doppler and Cardiac Doppler Indications:    TIA  History:        Patient has prior history of Echocardiogram examinations, most                 recent 11/11/2021. Risk Factors:Hypertension, Diabetes and                 Dyslipidemia.  Sonographer:    Raquel Sarna Senior RDCS Referring Phys: [place]  1. Left ventricular ejection fraction, by estimation, is 70 to 75%. The left ventricle has hyperdynamic function. The left ventricle has no regional wall motion abnormalities. There is moderate left ventricular hypertrophy. Left ventricular diastolic parameters are consistent with Grade I diastolic dysfunction (impaired relaxation).  2. Right ventricular systolic function is normal. The right ventricular size is normal. Tricuspid regurgitation signal is inadequate for assessing PA pressure.  3. A small pericardial effusion is present.  4. The mitral valve is grossly normal. There is a small echodensity on the mitral valve at the anterior leaflet tip. It is echogenic, and does not appear to have independent motion. This most likely represents leaflet calcification. The anterior leaflet  of the mitral valve appears long on this exam and somewhat myxomatous, however review of TEE  performed in 2019 demonstrates a grossly normal mitral valve, suggesting today's exam is likely artifact from imaging plane. Trivial mitral valve regurgitation.  No evidence of mitral stenosis.  5. The aortic valve is grossly normal. Aortic valve regurgitation is not visualized. No aortic stenosis is present.  6. The inferior vena cava is normal in size with greater than 50% respiratory variability, suggesting right atrial pressure of 3 mmHg. FINDINGS  Left Ventricle: Left ventricular ejection fraction, by estimation, is 70 to 75%. The left ventricle has hyperdynamic function. The left ventricle has no regional wall motion abnormalities. The left ventricular internal cavity size was normal in size. There is moderate left ventricular hypertrophy. Left ventricular diastolic parameters are consistent with Grade I diastolic dysfunction (impaired relaxation). Right Ventricle: The right ventricular size is normal. No increase in right ventricular wall thickness. Right ventricular systolic function is normal. Tricuspid regurgitation signal is inadequate for assessing PA pressure. Left Atrium: Left atrial size was normal in size. Right Atrium: Right atrial size was normal in size. Pericardium: A small pericardial effusion is present. Mitral Valve: The mitral valve is grossly normal. There is mild calcification of the mitral valve leaflet(s). Trivial mitral valve regurgitation. No evidence of mitral valve stenosis. Tricuspid Valve: The tricuspid valve is normal in structure. Tricuspid valve regurgitation is trivial. No evidence of tricuspid stenosis. Aortic Valve: The aortic valve is grossly normal. Aortic valve regurgitation is not visualized. No aortic stenosis is present. Pulmonic Valve: The pulmonic valve was normal in structure. Pulmonic valve regurgitation is trivial. No evidence of pulmonic stenosis. Aorta: The aortic root is normal in size and structure. Venous: The inferior vena cava is normal in size with greater  than 50% respiratory variability, suggesting right atrial pressure of 3 mmHg. IAS/Shunts: No atrial level shunt detected by color flow Doppler.  LEFT VENTRICLE PLAX 2D LVIDd:         2.80 cm   Diastology LVIDs:         1.70 cm   LV e' medial:    7.46 cm/s LV PW:         1.40 cm  LV E/e' medial:  6.5 LV IVS:        1.30 cm   LV e' lateral:   6.53 cm/s LVOT diam:     1.90 cm   LV E/e' lateral: 7.5 LV SV:         42 LV SV Index:   27 LVOT Area:     2.84 cm  RIGHT VENTRICLE RV S prime:     11.30 cm/s TAPSE (M-mode): 1.7 cm LEFT ATRIUM             Index        RIGHT ATRIUM           Index LA diam:        3.20 cm 2.02 cm/m   RA Area:     12.00 cm LA Vol (A2C):   36.9 ml 23.35 ml/m  RA Volume:   25.60 ml  16.20 ml/m LA Vol (A4C):   29.0 ml 18.35 ml/m LA Biplane Vol: 36.1 ml 22.84 ml/m  AORTIC VALVE LVOT Vmax:   77.70 cm/s LVOT Vmean:  61.400 cm/s LVOT VTI:    0.149 m  AORTA Ao Root diam: 3.10 cm MITRAL VALVE MV Area (PHT): 3.05 cm    SHUNTS MV Decel Time: 249 msec    Systemic VTI:  0.15 m MV E velocity: 48.80 cm/s  Systemic Diam: 1.90 cm MV A velocity: 71.60 cm/s MV E/A ratio:  0.68 Cherlynn Kaiser MD Electronically signed by Cherlynn Kaiser MD Signature Date/Time: 06/21/2022/8:46:02 PM    Final    EEG adult  Result Date: 06/21/2022 Lora Havens, MD     06/21/2022 11:52 AM Patient Name: Nadia Viar MRN: [id number] Epilepsy Attending: Lora Havens Referring Physician/Provider: Samella Parr, NP Date: 06/21/2022 Duration: 22.34 mins Patient history: 52yo F with ams. EEG to evaluate for seizure. Level of alertness: Awake AEDs during EEG study: None Technical aspects: This EEG study was done with scalp electrodes positioned according to the 10-20 International system of electrode placement. Electrical activity was reviewed with band pass filter of 1-70Hz , sensitivity of 7 uV/mm, display speed of 67mm/sec with a 60Hz  notched filter applied as appropriate. EEG data were recorded continuously and  digitally stored.  Video monitoring was available and reviewed as appropriate. Description: The posterior dominant rhythm consists of 8 Hz activity of moderate voltage (25-35 uV) seen predominantly in posterior head regions, symmetric and reactive to eye opening and eye closing.  Hyperventilation and photic stimulation were not performed.   IMPRESSION: This study is within normal limits. No seizures or epileptiform discharges were seen throughout the recording. Priyanka O Yadav   VAS US CAROTID  Result Date: 06/21/2022 Carotid Arterial Duplex Study Patient Name:  ARTIE TAKAYAMA  Date of Exam:   06/21/2022 Medical Rec #: [id number]         Accession #:    [id number] Date of Birth: 08/13/1970        Patient Gender: F Patient Age:   71 years Exam Location[facility]  [facility] Procedure:      VAS US CAROTID Referring Phys: Erin Hearing --------------------------------------------------------------------------------  Indications:       TIA. Risk Factors:      Hypertension, Diabetes, prior CVA. Comparison Study:  11-11-2021 Prior carotid duplex was near normal bilaterally. Performing Technologist: Darlin Coco RDMS, RVT  Examination Guidelines: A complete evaluation includes B-mode imaging, spectral Doppler, color Doppler, and power Doppler as needed of all accessible portions of each vessel. Bilateral testing is considered an integral part  of a complete examination. Limited examinations for reoccurring indications may be performed as noted.  Right Carotid Findings: +----------+--------+--------+--------+------------------+--------+           PSV cm/sEDV cm/sStenosisPlaque DescriptionComments +----------+--------+--------+--------+------------------+--------+ CCA Prox  79      16                                         +----------+--------+--------+--------+------------------+--------+ CCA Distal53      17                                          +----------+--------+--------+--------+------------------+--------+ ICA Prox  52      12                                         +----------+--------+--------+--------+------------------+--------+ ICA Mid   55      16                                         +----------+--------+--------+--------+------------------+--------+ ICA Distal108     34                                         +----------+--------+--------+--------+------------------+--------+ ECA       76      12                                         +----------+--------+--------+--------+------------------+--------+ +----------+--------+-------+----------------+-------------------+           PSV cm/sEDV cmsDescribe        Arm Pressure (mmHG) +----------+--------+-------+----------------+-------------------+ [id number]            Multiphasic, WNL                    +----------+--------+-------+----------------+-------------------+ +---------+--------+--+--------+-+---------+ VertebralPSV cm/s43EDV cm/s9Antegrade +---------+--------+--+--------+-+---------+  Left Carotid Findings: +----------+--------+--------+--------+-------------------+--------+           PSV cm/sEDV cm/sStenosisPlaque Description Comments +----------+--------+--------+--------+-------------------+--------+ CCA Prox  82      24                                          +----------+--------+--------+--------+-------------------+--------+ CCA Mid   92      25                                          +----------+--------+--------+--------+-------------------+--------+ CCA Distal65      15                                          +----------+--------+--------+--------+-------------------+--------+ ICA Prox  67      22  heterogenous , mild         +----------+--------+--------+--------+-------------------+--------+ ICA Mid   91      34                                           +----------+--------+--------+--------+-------------------+--------+ ICA Distal103     35                                          +----------+--------+--------+--------+-------------------+--------+ ECA       73      16                                          +----------+--------+--------+--------+-------------------+--------+ +----------+--------+--------+----------------+-------------------+           PSV cm/sEDV cm/sDescribe        Arm Pressure (mmHG) +----------+--------+--------+----------------+-------------------+ Subclavian130     130     Multiphasic, WNL                    +----------+--------+--------+----------------+-------------------+ +---------+--------+--+--------+--+---------+ VertebralPSV cm/s53EDV cm/s15Antegrade +---------+--------+--+--------+--+---------+   Summary: Right Carotid: The extracranial vessels were near-normal with only minimal wall                thickening or plaque. Left Carotid: The extracranial vessels were near-normal with only minimal wall               thickening or plaque. Vertebrals:  Bilateral vertebral arteries demonstrate antegrade flow. Subclavians: Normal flow hemodynamics were seen in bilateral subclavian              arteries. *See table(s) above for measurements and observations.     Preliminary    MR BRAIN WO CONTRAST  Result Date: 06/21/2022 CLINICAL DATA:  Transient ischemic attack. Headache. Altered mental status. Diabetes, hypertension and renal failure. EXAM: MRI HEAD WITHOUT CONTRAST MRA HEAD WITHOUT CONTRAST TECHNIQUE: Multiplanar, multi-echo pulse sequences of the brain and surrounding structures were acquired without intravenous contrast. Angiographic images of the Circle of Willis were acquired using MRA technique without intravenous contrast. COMPARISON:  Head CT yesterday. MRI 12/19/2021. MRI brain 07/05/2015. FINDINGS: MRI HEAD FINDINGS Brain: Diffusion imaging does not show any acute or subacute infarction.  Advanced chronic small-vessel ischemic changes are seen throughout the pons, middle cerebellar peduncles and cerebral hemispheres. Old small vessel infarctions of the thalami. Advanced chronic small-vessel ischemic changes of the cerebral hemispheric white matter. No large vessel territory stroke. No mass lesion, hemorrhage, hydrocephalus or extra-axial collection. There is scattered foci of hemosiderin deposition associated with many of the old small vessel infarctions. Particularly in this patient with multiple risk factors, the findings probably relate to chronic microvascular disease. One could not exclude coexistence demyelinating disease. The findings, though stable since the immediate prior exam, are markedly progressive since the oldest MRI study of 2017. No sign of acute hemorrhage, hydrocephalus or extra-axial collection. Vascular: Major vessels at the base of the brain show flow. Skull and upper cervical spine: Negative Sinuses/Orbits: Clear/normal Other: Bilateral mastoid effusions. These may be secondary to lymphoid tissue prominence in the posterior nasopharynx. MRA HEAD FINDINGS Both internal carotid arteries are widely patent into the brain. No siphon stenosis. The anterior and middle cerebral vessels are  patent without proximal stenosis, aneurysm or vascular malformation. Both vertebral arteries are widely patent to the basilar. No basilar stenosis. Posterior circulation branch vessels appear normal. IMPRESSION: 1. No acute brain finding. Advanced chronic small-vessel ischemic changes throughout the brain as outlined above. Scattered foci of hemosiderin deposition associated with many of the old small vessel infarctions. Particularly in this patient with multiple risk factors, the findings probably relate to chronic microvascular disease. One could not exclude coexistence demyelinating disease. The findings, though stable since the immediate prior exam, are markedly progressive since the oldest MRI  study of 2017. 2. Negative intracranial MR angiography of the large and medium size vessels. 3. Bilateral mastoid effusions, possibly secondary to prominent lymphoid tissue in the posterior nasopharynx. Electronically Signed   By: Nelson Chimes M.D.   On: 06/21/2022 09:39   MR ANGIO HEAD WO CONTRAST  Result Date: 06/21/2022 CLINICAL DATA:  Transient ischemic attack. Headache. Altered mental status. Diabetes, hypertension and renal failure. EXAM: MRI HEAD WITHOUT CONTRAST MRA HEAD WITHOUT CONTRAST TECHNIQUE: Multiplanar, multi-echo pulse sequences of the brain and surrounding structures were acquired without intravenous contrast. Angiographic images of the Circle of Willis were acquired using MRA technique without intravenous contrast. COMPARISON:  Head CT yesterday. MRI 12/19/2021. MRI brain 07/05/2015. FINDINGS: MRI HEAD FINDINGS Brain: Diffusion imaging does not show any acute or subacute infarction. Advanced chronic small-vessel ischemic changes are seen throughout the pons, middle cerebellar peduncles and cerebral hemispheres. Old small vessel infarctions of the thalami. Advanced chronic small-vessel ischemic changes of the cerebral hemispheric white matter. No large vessel territory stroke. No mass lesion, hemorrhage, hydrocephalus or extra-axial collection. There is scattered foci of hemosiderin deposition associated with many of the old small vessel infarctions. Particularly in this patient with multiple risk factors, the findings probably relate to chronic microvascular disease. One could not exclude coexistence demyelinating disease. The findings, though stable since the immediate prior exam, are markedly progressive since the oldest MRI study of 2017. No sign of acute hemorrhage, hydrocephalus or extra-axial collection. Vascular: Major vessels at the base of the brain show flow. Skull and upper cervical spine: Negative Sinuses/Orbits: Clear/normal Other: Bilateral mastoid effusions. These may be  secondary to lymphoid tissue prominence in the posterior nasopharynx. MRA HEAD FINDINGS Both internal carotid arteries are widely patent into the brain. No siphon stenosis. The anterior and middle cerebral vessels are patent without proximal stenosis, aneurysm or vascular malformation. Both vertebral arteries are widely patent to the basilar. No basilar stenosis. Posterior circulation branch vessels appear normal. IMPRESSION: 1. No acute brain finding. Advanced chronic small-vessel ischemic changes throughout the brain as outlined above. Scattered foci of hemosiderin deposition associated with many of the old small vessel infarctions. Particularly in this patient with multiple risk factors, the findings probably relate to chronic microvascular disease. One could not exclude coexistence demyelinating disease. The findings, though stable since the immediate prior exam, are markedly progressive since the oldest MRI study of 2017. 2. Negative intracranial MR angiography of the large and medium size vessels. 3. Bilateral mastoid effusions, possibly secondary to prominent lymphoid tissue in the posterior nasopharynx. Electronically Signed   By: Nelson Chimes M.D.   On: 06/21/2022 09:39   CT HEAD WO CONTRAST  Result Date: 06/20/2022 CLINICAL DATA:  Memory loss EXAM: CT HEAD WITHOUT CONTRAST TECHNIQUE: Contiguous axial images were obtained from the base of the skull through the vertex without intravenous contrast. RADIATION DOSE REDUCTION: This exam was performed according to the departmental dose-optimization program which includes automated exposure control, adjustment  of the mA and/or kV according to patient size and/or use of iterative reconstruction technique. COMPARISON:  MRI 12/19/2021, CT brain 12/18/2021 FINDINGS: Brain: No acute territorial infarction, hemorrhage or intracranial mass. Moderate severe chronic small vessel ischemic changes of the white matter. Multiple chronic lacunar infarcts involving the right  white matter, basal ganglia, thalamus and pons. Stable ventricle size. Vascular: No hyperdense vessels.  Carotid vascular calcification Skull: Normal. Negative for fracture or focal lesion. Sinuses/Orbits: No acute finding. Other: None IMPRESSION: 1. No CT evidence for acute intracranial abnormality. 2. Moderate to severe chronic small vessel ischemic changes of the white matter with multiple chronic lacunar infarcts. Electronically Signed   By: Donavan Foil M.D.   On: 06/20/2022 19:08   (Echo, Carotid, EGD, Colonoscopy, ERCP)    Subjective: No complaints  Discharge Exam: Vitals:   06/21/22 2310 06/22/22 0333  BP: (!) 172/92 (!) 161/89  Pulse: 91 85  Resp: 18 18  Temp: 98.2 F (36.8 C) 98.2 F (36.8 C)  SpO2: 100% 100%   Vitals:   06/21/22 1521 06/21/22 1951 06/21/22 2310 06/22/22 0333  BP: (!) 179/95 (!) 146/94 (!) 172/92 (!) 161/89  Pulse: 90 96 91 85  Resp: 18 18 18 18   Temp: 98.2 F (36.8 C) 97.8 F (36.6 C) 98.2 F (36.8 C) 98.2 F (36.8 C)  TempSrc: Oral Oral Oral Oral  SpO2: 100% 100% 100% 100%  Weight:      Height:        General: Pt is alert, awake, not in acute distress Cardiovascular: RRR, S1/S2 +, no rubs, no gallops Respiratory: CTA bilaterally, no wheezing, no rhonchi Abdominal: Soft, NT, ND, bowel sounds + Extremities: no edema, no cyanosis    The results of significant diagnostics from this hospitalization (including imaging, microbiology, ancillary and laboratory) are listed below for reference.     Microbiology: No results found for this or any previous visit (from the past 240 hour(s)).   Labs: BNP (last 3 results) No results for input(s): "BNP" in the last 8760 hours. Basic Metabolic Panel: Recent Labs  Lab 06/20/22 1843 06/22/22 0414  NA 142 142  K 3.6 3.4*  CL 108 108  CO2 23 25  GLUCOSE 98 92  BUN 27* 32*  CREATININE 2.59* 2.46*  CALCIUM 10.4* 9.3   Liver Function Tests: Recent Labs  Lab 06/20/22 1843  AST 13*  ALT 10   ALKPHOS 48  BILITOT 0.4  PROT 8.0  ALBUMIN 4.2   No results for input(s): "LIPASE", "AMYLASE" in the last 168 hours. No results for input(s): "AMMONIA" in the last 168 hours. CBC: Recent Labs  Lab 06/20/22 1843  WBC 5.5  NEUTROABS 3.3  HGB 11.1*  HCT 34.2*  MCV 88.4  PLT 329   Cardiac Enzymes: No results for input(s): "CKTOTAL", "CKMB", "CKMBINDEX", "TROPONINI" in the last 168 hours. BNP: Invalid input(s): "POCBNP" CBG: Recent Labs  Lab 06/20/22 1834 06/21/22 1133 06/21/22 1707 06/21/22 2103 06/22/22 0603  GLUCAP 90 87 108* 113* 101*   D-Dimer No results for input(s): "DDIMER" in the last 72 hours. Hgb A1c Recent Labs    06/21/22 0806  HGBA1C 5.9*   Lipid Profile Recent Labs    06/21/22 0806  CHOL 144  HDL 49  LDLCALC 75  TRIG 101  CHOLHDL 2.9   Thyroid function studies No results for input(s): "TSH", "T4TOTAL", "T3FREE", "THYROIDAB" in the last 72 hours.  Invalid input(s): "FREET3" Anemia work up No results for input(s): "VITAMINB12", "FOLATE", "FERRITIN", "TIBC", "IRON", "RETICCTPCT" in  the last 72 hours. Urinalysis    Component Value Date/Time   COLORURINE YELLOW 12/23/2021 0853   APPEARANCEUR CLEAR 12/23/2021 0853   LABSPEC 1.010 12/23/2021 0853   PHURINE 5.0 12/23/2021 0853   GLUCOSEU NEGATIVE 12/23/2021 0853   HGBUR NEGATIVE 12/23/2021 0853   BILIRUBINUR NEGATIVE 12/23/2021 0853   BILIRUBINUR neg 07/15/2018 1128   [place] 12/23/2021 0853   PROTEINUR NEGATIVE 12/23/2021 0853   UROBILINOGEN 0.2 07/15/2018 1128   UROBILINOGEN 0.2 04/23/2017 0920   NITRITE NEGATIVE 12/23/2021 0853   LEUKOCYTESUR SMALL (A) 12/23/2021 0853   Sepsis Labs Recent Labs  Lab 06/20/22 1843  WBC 5.5   Microbiology No results found for this or any previous visit (from the past 240 hour(s)).   SIGNED:   Charlynne Cousins, MD  [facility] 06/22/2022, 8:15 AM Pager   If 7PM-7AM, please contact night-coverage [email or web address] Password  [email or web address]

## 2022-06-22 NOTE — Care Management Obs Status (Signed)
[facility] NOTIFICATION   Patient Details  Name: Christina Orozco MRN: [id number] Date of Birth: [birth year]   Medicare Observation Status Notification Given:  Yes    Pollie Friar, RN 06/22/2022, 10:06 AM

## 2022-06-22 NOTE — Progress Notes (Addendum)
STROKE TEAM PROGRESS NOTE  ATTENDING NOTE: I reviewed above note and agree with the assessment and plan. Pt was seen and examined.   52 year old female with history of stroke, seizure, CKD, hypertension, hyperlipidemia, diabetes admitted for episode of confusion.  On 12/27 she had right ear decreased hearing, treated with antibiotic for common cold.  On 1/9 she was driving and her right ear hearing difficulty suddenly resolved after a "pop" sound, she was driving at the time, she had a confusion episode after that, not remember how she got home, cannot find her shoes on the left foot but called her mom told her she cannot find her clothes.  Symptom resolved within an hour.  Currently stroke workup all negative, EEG negative.  Patient neurologically intact.  Patient does have history of stroke and seizure as below Hx stroke/TIA 08/2015 CSF neg, protein 58 07/2017 MRI showed left pontine infarct, EF 65 to 70%, carotid Doppler and MRA negative.  LDL 43, A1c 12.5, patient put on DAPT and Lipitor 40. 08/2017 Loop recorder placed, no A. fib found 02/27/2019 woke up with complete vision loss, gradually vision resumed OD, still has better vision OS.  MRI at that time showed 3 mm acute to early subacute infarct in the dorsal midline of the pons negative MRI head and neck. 07/2019 loop recorder explanted 05/01/2020 MRI showed new T2 hyperintense lesion of the right frontal white matter likely reflect an area of late subacute or chronic infarction. 06/19/2020 Subcentimeter likely subacute infarct at the left lateral aspect of the pons.  MRA head unremarkable, carotid Doppler unremarkable, EF 70 to 75%.  LDL 63, A1c 6.2.  Put on aspirin and Brilinta for 1 months and then back to aspirin and the Plavix.  Continue Lipitor 80. 11/10/2021 - MRI showed possible small right cerebral peduncle infarcts.  MRA negative.  Carotid Doppler unremarkable.  EF 60 to 65%, LDL 34, A1c 6.5, discharged with ASA and brilinta 90 twice daily.   Outpatient follow-up with [facility], continued on aspirin and Brilinta 60 twice daily. 12/2021 admitted for right frontal SLO and small left pontine infarct, MRA head and neck negative, ESR and CRP negative, LDL 36, A1c 6.3, P2Y12 = 39, continued on aspirin and Brilinta.   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, discharged with Keppra 03/2018 CSF showed WBC 30, RBC 0, protein 57, glucose 221, infectious work up neg  Off Keppra 05/2021, so far no recurrence Follow-up with [facility] neurology   Etiology of patient's symptoms not quite clear, concerning for acute confusion state but cannot rule out TIA.  Recommend continue aspirin and Brilinta as well as statin. Neurology will sign off. Please call with questions. Pt will follow up with stroke clinic NP at [facility] in about 4 weeks. Thanks for the consult.  For detailed assessment and plan, please refer to above/below as I have made changes wherever appropriate.   Christina Hawking, MD PhD Stroke Neurology 06/22/2022 6:24 PM   INTERVAL HISTORY And is seen in her room with no family at the bedside.  Yesterday, when she was running errands in her car, patient suddenly felt confused and could not remember how to drive and could not remember how to reverse her car into a parking spot.  Symptoms resolved spontaneously and were likely due to a parietal lobe TIA.  Vitals:   06/21/22 1951 06/21/22 2310 06/22/22 0333 06/22/22 0826  BP: (!) 146/94 (!) 172/92 (!) 161/89 (!) 171/97  Pulse: 96 91 85 85  Resp: 18 18 18 20   Temp: 97.8 F (36.6 C) 98.2 F (36.8 C) 98.2 F (36.8 C) 98.3 F (36.8 C)  TempSrc: Oral Oral Oral Oral  SpO2: 100% 100% 100% 100%  Weight:      Height:       CBC:  Recent Labs  Lab 06/20/22 1843  WBC 5.5  NEUTROABS 3.3  HGB 11.1*  HCT 34.2*  MCV 88.4  PLT 546   Basic Metabolic Panel:  Recent Labs  Lab 06/20/22 1843 06/22/22 0414  NA 142 142  K 3.6 3.4*  CL 108 108  CO2 23 25  GLUCOSE 98 92   BUN 27* 32*  CREATININE 2.59* 2.46*  CALCIUM 10.4* 9.3   Lipid Panel:  Recent Labs  Lab 06/21/22 0806  CHOL 144  TRIG 101  HDL 49  CHOLHDL 2.9  VLDL 20  LDLCALC 75   HgbA1c:  Recent Labs  Lab 06/21/22 0806  HGBA1C 5.9*   Urine Drug Screen:  Recent Labs  Lab 06/22/22 0842  LABOPIA NONE DETECTED  COCAINSCRNUR NONE DETECTED  LABBENZ NONE DETECTED  AMPHETMU NONE DETECTED  THCU NONE DETECTED  LABBARB NONE DETECTED    Alcohol Level  Recent Labs  Lab 06/20/22 1843  ETH <10    IMAGING past 24 hours ECHOCARDIOGRAM COMPLETE  Result Date: 06/21/2022    ECHOCARDIOGRAM REPORT   Patient Name:   Christina Orozco Date of Exam: 06/21/2022 Medical Rec #:  [id number]        Height:       62.0 in Accession #:    [id number]       Weight:       127.8 lb Date of Birth:  1971/03/16       BSA:          1.580 m Patient Age:    10 years         BP:           175/96 mmHg Patient Gender: F                HR:           88 bpm. Exam Location:  Inpatient Procedure: 2D Echo, Color Doppler and Cardiac Doppler Indications:    TIA  History:        Patient has prior history of Echocardiogram examinations, most                 recent 11/11/2021. Risk Factors:Hypertension, Diabetes and                 Dyslipidemia.  Sonographer:    Raquel Sarna Senior RDCS Referring Phys: [place]  1. Left ventricular ejection fraction, by estimation, is 70 to 75%. The left ventricle has hyperdynamic function. The left ventricle has no regional wall motion abnormalities. There is moderate left ventricular hypertrophy. Left ventricular diastolic parameters are consistent with Grade I diastolic dysfunction (impaired relaxation).  2. Right ventricular systolic function is normal. The right ventricular size is normal. Tricuspid regurgitation signal is inadequate for assessing PA pressure.  3. A small pericardial effusion is present.  4. The mitral valve is grossly normal. There is a small echodensity on the mitral  valve at the anterior leaflet tip. It is echogenic, and does not appear to have independent motion. This most likely represents leaflet calcification. The anterior leaflet  of the mitral valve appears long on this exam and somewhat myxomatous, however review of TEE performed in 2019 demonstrates a grossly  normal mitral valve, suggesting today's exam is likely artifact from imaging plane. Trivial mitral valve regurgitation.  No evidence of mitral stenosis.  5. The aortic valve is grossly normal. Aortic valve regurgitation is not visualized. No aortic stenosis is present.  6. The inferior vena cava is normal in size with greater than 50% respiratory variability, suggesting right atrial pressure of 3 mmHg. FINDINGS  Left Ventricle: Left ventricular ejection fraction, by estimation, is 70 to 75%. The left ventricle has hyperdynamic function. The left ventricle has no regional wall motion abnormalities. The left ventricular internal cavity size was normal in size. There is moderate left ventricular hypertrophy. Left ventricular diastolic parameters are consistent with Grade I diastolic dysfunction (impaired relaxation). Right Ventricle: The right ventricular size is normal. No increase in right ventricular wall thickness. Right ventricular systolic function is normal. Tricuspid regurgitation signal is inadequate for assessing PA pressure. Left Atrium: Left atrial size was normal in size. Right Atrium: Right atrial size was normal in size. Pericardium: A small pericardial effusion is present. Mitral Valve: The mitral valve is grossly normal. There is mild calcification of the mitral valve leaflet(s). Trivial mitral valve regurgitation. No evidence of mitral valve stenosis. Tricuspid Valve: The tricuspid valve is normal in structure. Tricuspid valve regurgitation is trivial. No evidence of tricuspid stenosis. Aortic Valve: The aortic valve is grossly normal. Aortic valve regurgitation is not visualized. No aortic stenosis  is present. Pulmonic Valve: The pulmonic valve was normal in structure. Pulmonic valve regurgitation is trivial. No evidence of pulmonic stenosis. Aorta: The aortic root is normal in size and structure. Venous: The inferior vena cava is normal in size with greater than 50% respiratory variability, suggesting right atrial pressure of 3 mmHg. IAS/Shunts: No atrial level shunt detected by color flow Doppler.  LEFT VENTRICLE PLAX 2D LVIDd:         2.80 cm   Diastology LVIDs:         1.70 cm   LV e' medial:    7.46 cm/s LV PW:         1.40 cm   LV E/e' medial:  6.5 LV IVS:        1.30 cm   LV e' lateral:   6.53 cm/s LVOT diam:     1.90 cm   LV E/e' lateral: 7.5 LV SV:         42 LV SV Index:   27 LVOT Area:     2.84 cm  RIGHT VENTRICLE RV S prime:     11.30 cm/s TAPSE (M-mode): 1.7 cm LEFT ATRIUM             Index        RIGHT ATRIUM           Index LA diam:        3.20 cm 2.02 cm/m   RA Area:     12.00 cm LA Vol (A2C):   36.9 ml 23.35 ml/m  RA Volume:   25.60 ml  16.20 ml/m LA Vol (A4C):   29.0 ml 18.35 ml/m LA Biplane Vol: 36.1 ml 22.84 ml/m  AORTIC VALVE LVOT Vmax:   77.70 cm/s LVOT Vmean:  61.400 cm/s LVOT VTI:    0.149 m  AORTA Ao Root diam: 3.10 cm MITRAL VALVE MV Area (PHT): 3.05 cm    SHUNTS MV Decel Time: 249 msec    Systemic VTI:  0.15 m MV E velocity: 48.80 cm/s  Systemic Diam: 1.90 cm MV A velocity: 71.60 cm/s MV E/A  ratio:  0.68 Cherlynn Kaiser MD Electronically signed by Cherlynn Kaiser MD Signature Date/Time: 06/21/2022/8:46:02 PM    Final    EEG adult  Result Date: 06/21/2022 Lora Havens, MD     06/21/2022 11:52 AM Patient Name: Christina Orozco MRN: [id number] Epilepsy Attending: Lora Havens Referring Physician/Provider: Samella Parr, NP Date: 06/21/2022 Duration: 22.34 mins Patient history: 52yo F with ams. EEG to evaluate for seizure. Level of alertness: Awake AEDs during EEG study: None Technical aspects: This EEG study was done with scalp electrodes positioned according to the  10-20 International system of electrode placement. Electrical activity was reviewed with band pass filter of 1-70Hz , sensitivity of 7 uV/mm, display speed of 38mm/sec with a 60Hz  notched filter applied as appropriate. EEG data were recorded continuously and digitally stored.  Video monitoring was available and reviewed as appropriate. Description: The posterior dominant rhythm consists of 8 Hz activity of moderate voltage (25-35 uV) seen predominantly in posterior head regions, symmetric and reactive to eye opening and eye closing.  Hyperventilation and photic stimulation were not performed.   IMPRESSION: This study is within normal limits. No seizures or epileptiform discharges were seen throughout the recording. [place]    PHYSICAL EXAM General: Alert, well-nourished, well-developed patient in no acute distress Respiratory: Regular, unlabored respirations on room air  NEURO:  Mental Status: AA&Ox3  Speech/Language: speech is without dysarthria or aphasia.  Fluency, and comprehension intact.  Cranial Nerves:  II: PERRL.  III, IV, VI: EOMI. Eyelids elevate symmetrically.  V: Sensation is intact to light touch and symmetrical to face.  VII: Smile is symmetrical.  VIII: hearing intact to voice. IX, X: Phonation is normal.  YK:DXIPJASN shrug 5/5. XII: tongue is midline without fasciculations. Motor: 5/5 strength to all muscle groups tested.  Tone: is normal and bulk is normal Sensation- Intact to light touch bilaterally.  Gait- deferred   ASSESSMENT/PLAN Christina Orozco is a 52 y.o. female with history of stroke, seizures, CKD stage IV, hypertension, hyperlipidemia and peripheral neuropathy presenting after she suddenly felt confused, could not remember how to drive and could not remember how to reverse her car into a parking spot.  Symptoms resolved spontaneously and were likely due to a parietal lobe TIA.  No shaking or seizure-like activity was present, and patient has recall  of the event.  This makes seizure activity less likely as cause of symptoms  TIA vs. Acute confusion state CT head No acute abnormality. Small vessel disease.  MRI no acute abnormality, chronic small vessel ischemic disease MRA no LVO or hemodynamically significant stenosis Carotid Doppler unremarkable 2D Echo EF 70 to 75%  LDL 75 HgbA1c 5.9 VTE prophylaxis -Lovenox aspirin 81 mg daily and Brilinta (ticagrelor) 90 mg bid prior to admission, now on aspirin 81 mg daily and Brilinta (ticagrelor) 90 mg bid. Continue on discharge Therapy recommendations: No PT follow-up Disposition: Home  Hypertension Home meds: Amlodipine 5 mg daily, carvedilol 12.5 mg twice daily Stable Deep systolic blood pressure less than 180 Long-term BP goal normotensive  Hyperlipidemia Home meds: Atorvastatin 80 mg daily, resumed in hospital LDL 75, goal < 70 Continue statin at discharge   Other Stroke Risk Factors Hx stroke  Other Active Problems None  Hospital day # [place] , MSN, AGACNP-BC [facility] See Amion for schedule and pager information 06/22/2022 10:35 AM    To contact Stroke Continuity provider, please refer to [email or web address] After hours, contact General Neurology

## 2022-06-23 ENCOUNTER — Other Ambulatory Visit: Payer: Medicare Other

## 2022-08-09 ENCOUNTER — Encounter (HOSPITAL_COMMUNITY): Payer: Self-pay

## 2022-08-22 ENCOUNTER — Emergency Department (HOSPITAL_COMMUNITY): Payer: 59

## 2022-08-22 ENCOUNTER — Other Ambulatory Visit: Payer: Self-pay

## 2022-08-22 ENCOUNTER — Inpatient Hospital Stay (HOSPITAL_COMMUNITY)
Admission: EM | Admit: 2022-08-22 | Discharge: 2022-08-26 | DRG: 065 | Disposition: A | Discharge status: Rehabilitation Facility | Payer: 59 | Attending: Internal Medicine | Admitting: Internal Medicine

## 2022-08-22 ENCOUNTER — Other Ambulatory Visit: Payer: Self-pay | Admitting: Cardiovascular Disease

## 2022-08-22 DIAGNOSIS — Z820 Family history of epilepsy and other diseases of the nervous system: Secondary | ICD-10-CM

## 2022-08-22 DIAGNOSIS — I639 Cerebral infarction, unspecified: Principal | ICD-10-CM | POA: Diagnosis present

## 2022-08-22 DIAGNOSIS — Z803 Family history of malignant neoplasm of breast: Secondary | ICD-10-CM

## 2022-08-22 DIAGNOSIS — N184 Chronic kidney disease, stage 4 (severe): Secondary | ICD-10-CM | POA: Diagnosis not present

## 2022-08-22 DIAGNOSIS — R531 Weakness: Secondary | ICD-10-CM

## 2022-08-22 DIAGNOSIS — E1151 Type 2 diabetes mellitus with diabetic peripheral angiopathy without gangrene: Secondary | ICD-10-CM | POA: Diagnosis present

## 2022-08-22 DIAGNOSIS — Z7902 Long term (current) use of antithrombotics/antiplatelets: Secondary | ICD-10-CM

## 2022-08-22 DIAGNOSIS — F411 Generalized anxiety disorder: Secondary | ICD-10-CM | POA: Diagnosis not present

## 2022-08-22 DIAGNOSIS — E1122 Type 2 diabetes mellitus with diabetic chronic kidney disease: Secondary | ICD-10-CM | POA: Diagnosis present

## 2022-08-22 DIAGNOSIS — Z79899 Other long term (current) drug therapy: Secondary | ICD-10-CM

## 2022-08-22 DIAGNOSIS — G4733 Obstructive sleep apnea (adult) (pediatric): Secondary | ICD-10-CM | POA: Diagnosis present

## 2022-08-22 DIAGNOSIS — E119 Type 2 diabetes mellitus without complications: Secondary | ICD-10-CM | POA: Diagnosis present

## 2022-08-22 DIAGNOSIS — I10 Essential (primary) hypertension: Secondary | ICD-10-CM | POA: Diagnosis present

## 2022-08-22 DIAGNOSIS — J45909 Unspecified asthma, uncomplicated: Secondary | ICD-10-CM | POA: Diagnosis present

## 2022-08-22 DIAGNOSIS — Z7985 Long-term (current) use of injectable non-insulin antidiabetic drugs: Secondary | ICD-10-CM

## 2022-08-22 DIAGNOSIS — Z8673 Personal history of transient ischemic attack (TIA), and cerebral infarction without residual deficits: Secondary | ICD-10-CM

## 2022-08-22 DIAGNOSIS — H5509 Other forms of nystagmus: Secondary | ICD-10-CM | POA: Diagnosis present

## 2022-08-22 DIAGNOSIS — I6381 Other cerebral infarction due to occlusion or stenosis of small artery: Principal | ICD-10-CM | POA: Diagnosis present

## 2022-08-22 DIAGNOSIS — E1140 Type 2 diabetes mellitus with diabetic neuropathy, unspecified: Secondary | ICD-10-CM | POA: Diagnosis present

## 2022-08-22 DIAGNOSIS — Z7982 Long term (current) use of aspirin: Secondary | ICD-10-CM

## 2022-08-22 DIAGNOSIS — R42 Dizziness and giddiness: Secondary | ICD-10-CM

## 2022-08-22 DIAGNOSIS — K219 Gastro-esophageal reflux disease without esophagitis: Secondary | ICD-10-CM | POA: Diagnosis present

## 2022-08-22 DIAGNOSIS — G8194 Hemiplegia, unspecified affecting left nondominant side: Secondary | ICD-10-CM | POA: Diagnosis present

## 2022-08-22 DIAGNOSIS — E1159 Type 2 diabetes mellitus with other circulatory complications: Secondary | ICD-10-CM | POA: Diagnosis present

## 2022-08-22 DIAGNOSIS — Z888 Allergy status to other drugs, medicaments and biological substances status: Secondary | ICD-10-CM

## 2022-08-22 DIAGNOSIS — Z880 Allergy status to penicillin: Secondary | ICD-10-CM

## 2022-08-22 DIAGNOSIS — E663 Overweight: Secondary | ICD-10-CM | POA: Diagnosis present

## 2022-08-22 DIAGNOSIS — Z800 Family history of malignant neoplasm of digestive organs: Secondary | ICD-10-CM

## 2022-08-22 DIAGNOSIS — I129 Hypertensive chronic kidney disease with stage 1 through stage 4 chronic kidney disease, or unspecified chronic kidney disease: Secondary | ICD-10-CM | POA: Diagnosis present

## 2022-08-22 DIAGNOSIS — R1310 Dysphagia, unspecified: Secondary | ICD-10-CM | POA: Diagnosis present

## 2022-08-22 DIAGNOSIS — R29702 NIHSS score 2: Secondary | ICD-10-CM | POA: Diagnosis present

## 2022-08-22 DIAGNOSIS — Z833 Family history of diabetes mellitus: Secondary | ICD-10-CM

## 2022-08-22 DIAGNOSIS — Z8249 Family history of ischemic heart disease and other diseases of the circulatory system: Secondary | ICD-10-CM

## 2022-08-22 DIAGNOSIS — F32A Depression, unspecified: Secondary | ICD-10-CM | POA: Diagnosis present

## 2022-08-22 DIAGNOSIS — Z95818 Presence of other cardiac implants and grafts: Secondary | ICD-10-CM

## 2022-08-22 DIAGNOSIS — Z6825 Body mass index (BMI) 25.0-25.9, adult: Secondary | ICD-10-CM

## 2022-08-22 DIAGNOSIS — R270 Ataxia, unspecified: Secondary | ICD-10-CM | POA: Diagnosis present

## 2022-08-22 DIAGNOSIS — R112 Nausea with vomiting, unspecified: Secondary | ICD-10-CM | POA: Diagnosis present

## 2022-08-22 DIAGNOSIS — E785 Hyperlipidemia, unspecified: Secondary | ICD-10-CM | POA: Diagnosis present

## 2022-08-22 DIAGNOSIS — R4701 Aphasia: Secondary | ICD-10-CM | POA: Diagnosis present

## 2022-08-22 LAB — CBC
HCT: 29.3 % — ABNORMAL LOW (ref 36.0–46.0)
Hemoglobin: 9.5 g/dL — ABNORMAL LOW (ref 12.0–15.0)
MCH: 29.4 pg (ref 26.0–34.0)
MCHC: 32.4 g/dL (ref 30.0–36.0)
MCV: 90.7 fL (ref 80.0–100.0)
Platelets: 252 10*3/uL (ref 150–400)
RBC: 3.23 MIL/uL — ABNORMAL LOW (ref 3.87–5.11)
RDW: 14.4 % (ref 11.5–15.5)
WBC: 5.5 10*3/uL (ref 4.0–10.5)
nRBC: 0 % (ref 0.0–0.2)

## 2022-08-22 LAB — URINALYSIS, ROUTINE W REFLEX MICROSCOPIC
Bilirubin Urine: NEGATIVE
Glucose, UA: NEGATIVE mg/dL
Ketones, ur: NEGATIVE mg/dL
Nitrite: NEGATIVE
Protein, ur: 100 mg/dL — AB
Specific Gravity, Urine: 1.016 (ref 1.005–1.030)
pH: 5 (ref 5.0–8.0)

## 2022-08-22 LAB — DIFFERENTIAL
Abs Immature Granulocytes: 0.02 10*3/uL (ref 0.00–0.07)
Basophils Absolute: 0 10*3/uL (ref 0.0–0.1)
Basophils Relative: 1 %
Eosinophils Absolute: 0.1 10*3/uL (ref 0.0–0.5)
Eosinophils Relative: 2 %
Immature Granulocytes: 0 %
Lymphocytes Relative: 33 %
Lymphs Abs: 1.8 10*3/uL (ref 0.7–4.0)
Monocytes Absolute: 0.3 10*3/uL (ref 0.1–1.0)
Monocytes Relative: 5 %
Neutro Abs: 3.3 10*3/uL (ref 1.7–7.7)
Neutrophils Relative %: 59 %

## 2022-08-22 LAB — COMPREHENSIVE METABOLIC PANEL
ALT: 13 U/L (ref 0–44)
AST: 20 U/L (ref 15–41)
Albumin: 3.5 g/dL (ref 3.5–5.0)
Alkaline Phosphatase: 44 U/L (ref 38–126)
Anion gap: 6 (ref 5–15)
BUN: 38 mg/dL — ABNORMAL HIGH (ref 6–20)
CO2: 21 mmol/L — ABNORMAL LOW (ref 22–32)
Calcium: 9.1 mg/dL (ref 8.9–10.3)
Chloride: 111 mmol/L (ref 98–111)
Creatinine, Ser: 2.61 mg/dL — ABNORMAL HIGH (ref 0.44–1.00)
GFR, Estimated: 22 mL/min — ABNORMAL LOW (ref >60)
Glucose, Bld: 179 mg/dL — ABNORMAL HIGH (ref 70–99)
Potassium: 4.1 mmol/L (ref 3.5–5.1)
Sodium: 138 mmol/L (ref 135–145)
Total Bilirubin: 0.3 mg/dL (ref 0.3–1.2)
Total Protein: 6.7 g/dL (ref 6.5–8.1)

## 2022-08-22 LAB — I-STAT BETA HCG BLOOD, ED (MC, WL, AP ONLY): I-stat hCG, quantitative: <5 m[IU]/mL (ref <5)

## 2022-08-22 LAB — RAPID URINE DRUG SCREEN, HOSP PERFORMED
Amphetamines: NOT DETECTED
Barbiturates: NOT DETECTED
Benzodiazepines: NOT DETECTED
Cocaine: NOT DETECTED
Opiates: NOT DETECTED
Tetrahydrocannabinol: NOT DETECTED

## 2022-08-22 LAB — PROTIME-INR
INR: 1 (ref 0.8–1.2)
Prothrombin Time: 13.1 seconds (ref 11.4–15.2)

## 2022-08-22 LAB — I-STAT CHEM 8, ED
BUN: 36 mg/dL — ABNORMAL HIGH (ref 6–20)
Calcium, Ion: 1.26 mmol/L (ref 1.15–1.40)
Chloride: 110 mmol/L (ref 98–111)
Creatinine, Ser: 2.9 mg/dL — ABNORMAL HIGH (ref 0.44–1.00)
Glucose, Bld: 175 mg/dL — ABNORMAL HIGH (ref 70–99)
HCT: 29 % — ABNORMAL LOW (ref 36.0–46.0)
Hemoglobin: 9.9 g/dL — ABNORMAL LOW (ref 12.0–15.0)
Potassium: 4.1 mmol/L (ref 3.5–5.1)
Sodium: 142 mmol/L (ref 135–145)
TCO2: 23 mmol/L (ref 22–32)

## 2022-08-22 LAB — CBG MONITORING, ED: Glucose-Capillary: 169 mg/dL — ABNORMAL HIGH (ref 70–99)

## 2022-08-22 LAB — APTT: aPTT: 22 seconds — ABNORMAL LOW (ref 24–36)

## 2022-08-22 LAB — ETHANOL: Alcohol, Ethyl (B): <10 mg/dL (ref <10)

## 2022-08-22 MED ORDER — ACETAMINOPHEN 160 MG/5ML PO SOLN: 650.0000 mg | ORAL | Status: DC | PRN

## 2022-08-22 MED ORDER — IOHEXOL 350 MG/ML SOLN
75.0000 mL | Freq: Once | INTRAVENOUS | Status: AC | PRN
  Administered 2022-08-22: 75 mL via INTRAVENOUS

## 2022-08-22 MED ORDER — ATORVASTATIN CALCIUM 80 MG PO TABS
80.0000 mg | ORAL_TABLET | Freq: Every day | ORAL | Status: DC
  Administered 2022-08-23 – 2022-08-25 (×3): 80 mg via ORAL
  Filled 2022-08-22 (×3): qty 1

## 2022-08-22 MED ORDER — ACETAMINOPHEN 325 MG PO TABS
650.0000 mg | ORAL_TABLET | ORAL | Status: DC | PRN
  Administered 2022-08-23 – 2022-08-24 (×4): 650 mg via ORAL
  Filled 2022-08-22 (×4): qty 2

## 2022-08-22 MED ORDER — ASPIRIN 81 MG PO TBEC
81.0000 mg | DELAYED_RELEASE_TABLET | Freq: Every day | ORAL | Status: DC
  Administered 2022-08-23 – 2022-08-26 (×4): 81 mg via ORAL
  Filled 2022-08-22 (×4): qty 1

## 2022-08-22 MED ORDER — DIPHENHYDRAMINE HCL 50 MG/ML IJ SOLN
12.5000 mg | Freq: Once | INTRAMUSCULAR | Status: AC
  Administered 2022-08-22: 12.5 mg via INTRAVENOUS
  Filled 2022-08-22: qty 1

## 2022-08-22 MED ORDER — PROCHLORPERAZINE EDISYLATE 10 MG/2ML IJ SOLN
10.0000 mg | Freq: Once | INTRAMUSCULAR | Status: AC
  Administered 2022-08-22: 10 mg via INTRAVENOUS
  Filled 2022-08-22: qty 2

## 2022-08-22 MED ORDER — ACETAMINOPHEN 650 MG RE SUPP: 650.0000 mg | RECTAL | Status: DC | PRN

## 2022-08-22 MED ORDER — MECLIZINE HCL 25 MG PO TABS
25.0000 mg | ORAL_TABLET | Freq: Three times a day (TID) | ORAL | Status: DC | PRN
  Administered 2022-08-23 – 2022-08-24 (×4): 25 mg via ORAL
  Filled 2022-08-22 (×6): qty 1

## 2022-08-22 MED ORDER — STROKE: EARLY STAGES OF RECOVERY BOOK
Freq: Once | Status: AC
  Filled 2022-08-22: qty 1

## 2022-08-22 MED ORDER — TICAGRELOR 90 MG PO TABS
90.0000 mg | ORAL_TABLET | Freq: Two times a day (BID) | ORAL | Status: DC
  Administered 2022-08-23 – 2022-08-26 (×7): 90 mg via ORAL
  Filled 2022-08-22 (×7): qty 1

## 2022-08-22 MED ORDER — ONDANSETRON HCL 4 MG/2ML IJ SOLN
4.0000 mg | Freq: Four times a day (QID) | INTRAMUSCULAR | Status: DC | PRN
  Administered 2022-08-23 – 2022-08-24 (×4): 4 mg via INTRAVENOUS
  Filled 2022-08-22 (×4): qty 2

## 2022-08-22 NOTE — Assessment & Plan Note (Signed)
-  Remains at baseline. Baseline appears to be around 2-3

## 2022-08-22 NOTE — Assessment & Plan Note (Addendum)
-  pt developed acute vertigo with horizontal nystagmus/nausea/vomiting shortly after returning from MRI, secondary to CVA.  No improvement with Antivert or Reglan, but did well with IV Valium.  Attempted to try p.o., but to no avail.  Consider in the future when trying to go with p.o. Valium, increased dosing.

## 2022-08-22 NOTE — Telephone Encounter (Signed)
Rx request sent to pharmacy.  

## 2022-08-22 NOTE — Assessment & Plan Note (Signed)
-  Continue PRN bronchodilator

## 2022-08-22 NOTE — Code Documentation (Signed)
Stroke Response Nurse Documentation Code Documentation  Kaeliana Pinos is a 52 y.o. female arriving to [facility]  via [facility] on 3/12 with past medical hx of CVA, PRES, seizure, CKD, TIA. On Brilinta (ticagrelor) 90 mg bid. Code stroke was activated by EMS.   Patient from home where she was LKW at 1230 and now complaining of L sided weakness, dizziness, and aphasia. Pt reports at 56 she called her friend to tell her about her blood pressure being elevated and had at that time started to feel generally unwell. Then she went and got in the bathtub and soaked for an undetermined amount of time, during which time she began to feel dizzy, have L sided weakness, and slurred speech. EMS was called at that time and Code Stroke was activated.   Stroke team at the bedside on patient arrival. Labs drawn and patient cleared for CT by Dr. Doren Custard. Patient to CT with team. NIHSS 2, see documentation for details and code stroke times. Patient with left limb ataxia and left decreased sensation on exam. The following imaging was completed:  CT Head and CTA. Patient is not a candidate for IV Thrombolytic due to out of window. Patient is not not a candidate for IR due to no LVO on imaging per MD.   Care Plan: q2 hr neuro checks, MRI.   Bedside handoff with ED RN Herbert Spires.    [place]  Stroke Response RN

## 2022-08-22 NOTE — ED Notes (Signed)
Pt back form MRI

## 2022-08-22 NOTE — ED Notes (Signed)
Patient is resting comfortably. 

## 2022-08-22 NOTE — Assessment & Plan Note (Addendum)
A1C of 5/9 (06/2022) with hyperglycemia at 179 on presentation.  Continue to monitor

## 2022-08-22 NOTE — Assessment & Plan Note (Addendum)
-  holding Coreg, amlodipine to allow for permissive HTN.  As needed hydralazine for systolic blood pressure greater than 180.  In the next 5 to 7 days, can restart her home medications for target normal blood pressure goal

## 2022-08-22 NOTE — ED Notes (Signed)
RN attempted swallow screen and pt states she has an issue with swallowing already. Swallow evaluation order put in

## 2022-08-22 NOTE — ED Provider Notes (Signed)
[facility] Provider Note   CSN: [id number] Arrival date & time: 08/22/22  1643  An emergency department physician performed an initial assessment on this suspected stroke patient at [facility].  History  Chief Complaint  Patient presents with   Code Stroke    Christina Orozco is a 52 y.o. female.  Patient is a 52 year old female with a history of diabetes, hypertension and prior stroke/TIA.  Who presents as a code stroke.  She has had a prior history of TIA in January.  She said that she started feeling bad with a last known normal about 1230 this afternoon.  She was having some vision change in her left eye and weakness in her left leg.  She also noted some slurred speech.  She says her symptoms are still consistent.  She noted her blood pressure was elevated.       Home Medications Prior to Admission medications   Medication Sig Start Date End Date Taking? Authorizing Provider  acetaminophen (TYLENOL) 325 MG tablet Take 1-2 tablets (325-650 mg total) by mouth every 4 (four) hours as needed for mild pain. Patient taking differently: Take 650 mg by mouth as needed for mild pain. 12/30/21   Love, Ivan Anchors, PA-C  albuterol (PROAIR HFA) 108 (90 Base) MCG/ACT inhaler INHALE 1 OR 2 PUFFS INTO THE LUNGS EVERY 6 HOURS AS NEEDED FOR WHEEZING OR SHORTNESS OF BREATH Patient taking differently: Inhale 2 puffs into the lungs as needed for shortness of breath or wheezing. 04/12/18   Angiulli, Lavon Paganini, PA-C  amLODipine (NORVASC) 5 MG tablet TAKE 1 TABLET (5 MG TOTAL) BY MOUTH DAILY. (AM) 08/22/22   Skeet Latch, MD  aspirin EC 81 MG tablet Take 1 tablet (81 mg total) by mouth daily. 12/30/21   Love, Ivan Anchors, PA-C  atorvastatin (LIPITOR) 80 MG tablet Take 80 mg by mouth at bedtime. 02/25/20   [provider]  azithromycin (ZITHROMAX) 250 MG tablet Take 1 tablet (250 mg total) by mouth daily. Take first 2 tablets together, then 1 every day until  finished. Patient not taking: Reported on 06/21/2022 06/07/22   Rex Kras, PA  carboxymethylcellulose (REFRESH PLUS) 0.5 % SOLN Place 1 drop into both eyes daily.    [provider]  carvedilol (COREG) 12.5 MG tablet Take 1 tablet (12.5 mg total) by mouth 2 (two) times daily with a meal. 10/26/21   Skeet Latch, MD  Cyanocobalamin (VITAMIN B-12 PO) Take 2 capsules by mouth daily.    [provider]  diclofenac Sodium (VOLTAREN) 1 % GEL Apply 1 Application topically daily as needed (pain).    [provider]  DULoxetine (CYMBALTA) 60 MG capsule Take 60 mg by mouth at bedtime.    [provider]  fluticasone (FLONASE) 50 MCG/ACT nasal spray Place 1 spray into both nostrils as needed for allergies. 05/01/22   [provider]  glucose blood (CONTOUR NEXT TEST) test strip 1 each by Other route 2 (two) times daily. And lancets 2/day Patient taking differently: 1 each by Other route 2 (two) times daily. 04/12/18   Angiulli, Lavon Paganini, PA-C  montelukast (SINGULAIR) 10 MG tablet Take 10 mg by mouth at bedtime. Patient not taking: Reported on 06/21/2022    [provider]  OZEMPIC, 0.25 OR 0.5 MG/DOSE, 2 MG/3ML SOPN Inject 0.5 mg into the skin See admin instructions. Inject 0.5 mg subcutaneous every 10 days 04/14/22   [provider]  pantoprazole (PROTONIX) 40 MG tablet Take 1  tablet (40 mg total) by mouth daily. 12/30/21   Love, Ivan Anchors, PA-C  prednisoLONE acetate (PRED FORTE) 1 % ophthalmic suspension INSTILL 1 DROP INTO THE RIGHT EYE 4 TIMES EVERY DAY Patient not taking: Reported on 06/21/2022 03/11/22   [provider]  ticagrelor (BRILINTA) 90 MG TABS tablet Take 90 mg by mouth in the morning and at bedtime. 01/11/22   [provider]  traZODone (DESYREL) 50 MG tablet Take 1 tablet (50 mg total) by mouth at bedtime as needed for sleep. Patient not taking: Reported on 06/21/2022 12/30/21   Bary Leriche, PA-C      Allergies     Lisinopril, Penicillins, Quetiapine, and Gabapentin    Review of Systems   Review of Systems  Constitutional:  Negative for chills, diaphoresis, fatigue and fever.  HENT:  Negative for congestion, rhinorrhea and sneezing.   Eyes:  Positive for visual disturbance.  Respiratory:  Negative for cough, chest tightness and shortness of breath.   Cardiovascular:  Negative for chest pain and leg swelling.  Gastrointestinal:  Negative for abdominal pain, blood in stool, diarrhea, nausea and vomiting.  Genitourinary:  Negative for difficulty urinating, flank pain, frequency and hematuria.  Musculoskeletal:  Negative for arthralgias and back pain.  Skin:  Negative for rash.  Neurological:  Positive for speech difficulty, weakness, numbness and headaches. Negative for dizziness.    Physical Exam Updated Vital Signs BP (!) 175/84   Pulse 78   Temp 98.2 F (36.8 C) (Oral)   Resp 16   Wt 64.4 kg   LMP 05/22/2018 (Within Days)   SpO2 100%   BMI 25.97 kg/m  Physical Exam Constitutional:      Appearance: She is well-developed.  HENT:     Head: Normocephalic and atraumatic.  Eyes:     Pupils: Pupils are equal, round, and reactive to light.  Cardiovascular:     Rate and Rhythm: Normal rate and regular rhythm.     Heart sounds: Normal heart sounds.  Pulmonary:     Effort: Pulmonary effort is normal. No respiratory distress.     Breath sounds: Normal breath sounds. No wheezing or rales.  Chest:     Chest wall: No tenderness.  Abdominal:     General: Bowel sounds are normal.     Palpations: Abdomen is soft.     Tenderness: There is no abdominal tenderness. There is no guarding or rebound.  Musculoskeletal:        General: Normal range of motion.     Cervical back: Normal range of motion and neck supple.  Lymphadenopathy:     Cervical: No cervical adenopathy.  Skin:    General: Skin is warm and dry.     Findings: No rash.  Neurological:     Mental Status: She is alert and oriented  to person, place, and time.     Comments: Motor strength is normal in all extremities other than the left lower extremity.  She cannot really raise her leg off the bed.  She can wiggle her toes.  She has some diminished sensation in the left leg as compared to the other extremities.  Cranial nerves II through XII grossly intact.     ED Results / Procedures / Treatments   Labs (all labs ordered are listed, but only abnormal results are displayed) Labs Reviewed  APTT - Abnormal; Notable for the following components:      Result Value   aPTT 22 (*)    All other components within  normal limits  CBC - Abnormal; Notable for the following components:   RBC 3.23 (*)    Hemoglobin 9.5 (*)    HCT 29.3 (*)    All other components within normal limits  COMPREHENSIVE METABOLIC PANEL - Abnormal; Notable for the following components:   CO2 21 (*)    Glucose, Bld 179 (*)    BUN 38 (*)    Creatinine, Ser 2.61 (*)    GFR, Estimated 22 (*)    All other components within normal limits  I-STAT CHEM 8, ED - Abnormal; Notable for the following components:   BUN 36 (*)    Creatinine, Ser 2.90 (*)    Glucose, Bld 175 (*)    Hemoglobin 9.9 (*)    HCT 29.0 (*)    All other components within normal limits  CBG MONITORING, ED - Abnormal; Notable for the following components:   Glucose-Capillary 169 (*)    All other components within normal limits  ETHANOL  PROTIME-INR  DIFFERENTIAL  RAPID URINE DRUG SCREEN, HOSP PERFORMED  URINALYSIS, ROUTINE W REFLEX MICROSCOPIC  I-STAT BETA HCG BLOOD, ED ([facility], [facility], [facility] ONLY)    EKG None  Radiology CT ANGIO HEAD NECK W WO CM (CODE STROKE)  Result Date: 08/22/2022 CLINICAL DATA:  Stroke suspected EXAM: CT ANGIOGRAPHY HEAD AND NECK TECHNIQUE: Multidetector CT imaging of the head and neck was performed using the standard protocol during bolus administration of intravenous contrast. Multiplanar CT image reconstructions and MIPs were obtained to evaluate the vascular  anatomy. Carotid stenosis measurements (when applicable) are obtained utilizing NASCET criteria, using the distal internal carotid diameter as the denominator. RADIATION DOSE REDUCTION: This exam was performed according to the departmental dose-optimization program which includes automated exposure control, adjustment of the mA and/or kV according to patient size and/or use of iterative reconstruction technique. CONTRAST:  86m OMNIPAQUE IOHEXOL 350 MG/ML SOLN COMPARISON:  Same day CT head, MRI Head/MRA head 03/22/23 FINDINGS: CT HEAD FINDINGS See same day CT brain for intracranial findings. No contrast enhancing lesion is visualized. CTA NECK FINDINGS Aortic arch: Standard branching. Imaged portion shows no evidence of aneurysm or dissection. No significant stenosis of the major arch vessel origins. Right carotid system: No evidence of dissection, stenosis (50% or greater), or occlusion. Left carotid system: No evidence of dissection, stenosis (50% or greater), or occlusion. Vertebral arteries: Left dominant. No evidence of dissection, stenosis (50% or greater), or occlusion. Skeleton: Status post C5-C6 ACDF Other neck: Negative. Upper chest: Negative. Review of the MIP images confirms the above findings CTA HEAD FINDINGS Anterior circulation: No significant stenosis, proximal occlusion, aneurysm, or vascular malformation. Posterior circulation: No significant stenosis, proximal occlusion, aneurysm, or vascular malformation. Venous sinuses: As permitted by contrast timing, patent. Anatomic variants: None Review of the MIP images confirms the above findings IMPRESSION: 1. No intracranial large vessel occlusion or significant stenosis. 2. No hemodynamically significant stenosis in the neck. Electronically Signed   By: HMarin RobertsM.D.   On: 08/22/2022 17:23   CT HEAD CODE STROKE WO CONTRAST  Result Date: 08/22/2022 CLINICAL DATA:  Code stroke.  Neuro deficit, acute, stroke suspected EXAM: CT HEAD WITHOUT  CONTRAST TECHNIQUE: Contiguous axial images were obtained from the base of the skull through the vertex without intravenous contrast. RADIATION DOSE REDUCTION: This exam was performed according to the departmental dose-optimization program which includes automated exposure control, adjustment of the mA and/or kV according to patient size and/or use of iterative reconstruction technique. COMPARISON:  None Available. FINDINGS: Brain: Remote  bilateral thalamic lacunar infarcts. Remote probable perforator infarct in the right internal capsule and bilateral corona radiata. No evidence of acute large vascular territory infarct. Additional patchy white matter hypodensities, nonspecific but compatible with chronic microvascular ischemic disease. No evidence of acute hemorrhage, mass lesion midline shift or hydrocephalus. Vascular: No hyperdense vessel identified. Skull: No acute fracture. Sinuses/Orbits: Clear sinuses.  No acute orbital findings. Other: Small left mastoid effusion. ASPECTS [facility] Stroke Program Early CT Score) total score (0-10 with 10 being normal): 10. IMPRESSION: 1. No evidence of acute large vascular territory infarct or acute hemorrhage. ASPECTS is 10. 2. Multiple remote lacunar infarcts and chronic microvascular ischemic disease. Code stroke imaging results were communicated on 08/22/2022 at 4:59 pm to provider Dr. Lorrin Goodell via secure text paging. Electronically Signed   By: Margaretha Sheffield M.D.   On: 08/22/2022 16:59    Procedures Procedures    Medications Ordered in ED Medications  iohexol (OMNIPAQUE) 350 MG/ML injection 75 mL (75 mLs Intravenous Contrast Given 08/22/22 1709)    ED Course/ Medical Decision Making/ A&P                             Medical Decision Making Amount and/or Complexity of Data Reviewed Labs: ordered. Radiology: ordered.  Risk Decision regarding hospitalization.   Patient presents as a code stroke with left-sided weakness and slurred speech.  She  was out of the window for tPA administration.  No evidence of large vessel occlusion on imaging studies.  No intracranial hemorrhage.  Her labs are reviewed.  She has some baseline anemia and elevation in her creatinine.  Not significantly changed from prior labs based on chart review.  She has been seen by neurology and recommended for admission to the hospitalist service for stroke evaluation.  MRI has been ordered.  I spoke with Dr.Tu who will admit the patient for further treatment.  Final Clinical Impression(s) / ED Diagnoses Final diagnoses:  Acute ischemic stroke [facility])    Rx / [place] Orders ED Discharge Orders     None         Malvin Johns, MD 08/22/22 1849

## 2022-08-22 NOTE — ED Triage Notes (Signed)
Pt BIB EMS due to code stroke. Pt has hx of TIA and strokes. Pt LSN 1230. Pt started feeling unwell, weka on left side, and speaking changes per family. Pt is axox4.

## 2022-08-22 NOTE — H&P (Signed)
History and Physical    Patient: Christina Orozco [id number] DOB: 14-Jun-1970 DOA: 08/22/2022 DOS: the patient was seen and examined on 08/22/2022 PCP: Jenel Lucks, PA-C  Patient coming from: Home  Chief Complaint:  Chief Complaint  Patient presents with   Code Stroke   HPI: Christina Orozco is a 52 y.o. female with medical history significant of CVA/TIA on aspirin and Brilinta, PRES secondary to HTN, seizure, T2DM, CKD 4, PVD, anxiety/depression who presents with left sided weakness and dizziness.   Pt provides limited hx as she was acutely ill with vertigo and had vomited after transferring beds at MRI just prior to my evaluation. Says earlier today around noon she was getting into a bathtub and had dizziness/room spinning sensation causing her to not be able to get out. LKW documented around 1230. She waited for an hour before she was able to get help. Reports dizzy spells usually correlate with her strokes. On arrival to ED she was noted by neurology to have difficulty with dorsiflex or plantarflexion of her left foot, unable to invert or evert. This has now resolved. However now has visible horizontal nystagmus worse with ocular movement on my exam.   Pt was last hospitalized 1/9-1/11 for TIA with 2D echo showing preserved EF no wall motion abnormality.  Carotid Doppler showed no significant stenosis. Neurology was consulted also recommended an EEG that showed no signs of seizures. She was continued on aspirin and Brilinta. Reports compliance.  Denies any alcohol or illicit drug use.   In the ED, she presented as a CODE stroke but was outside window for tNKASE.   CT head with no evidence of new infarct or hemorrhage.  CTA head and neck without LVO.   she was afebrile, BP up to 170/80sm HR 70s on room air.   No leukocytosis. Hgb stable at 9.5. No significant electrolyte abnormalities. Creatinine around baseline. CBG elevated at 176.  ETOH <10. UDS and UA pending.    EKG on my review with sinus rhythm and baseline artifacts.   Review of Systems: As mentioned in the history of present illness. All other systems reviewed and are negative. Past Medical History:  Diagnosis Date   Anemia    severe   Anxiety    Asthma    CKD (chronic kidney disease)    stage IV   DDD (degenerative disc disease), cervical    Depression    Diabetes mellitus (HCC)    Dislocated shoulder    right   GERD (gastroesophageal reflux disease)    Headache    migraines (about once a month)   History of kidney stones    Hypertension    Neuropathy    Peripheral vascular disease ([place])    Pneumonia    Stroke [facility])    Past Surgical History:  Procedure Laterality Date   ADENOIDECTOMY     APPENDECTOMY     CERVICAL SPINE SURGERY  06/2012   C5-C6   EYE SURGERY     left eye surgery    FOOT SURGERY     KENALOG INJECTION Left 03/10/2021   Procedure: Intravitreal Avastin Injection;  Surgeon: Sherlynn Stalls, MD;  Location: [place];  Service: Ophthalmology;  Laterality: Left;   LOOP RECORDER INSERTION N/A 08/27/2017   Procedure: LOOP RECORDER INSERTION;  Surgeon: Sanda Klein, MD;  Location: [place] CV LAB;  Service: Cardiovascular;  Laterality: N/A;   MEMBRANE PEEL Left 03/10/2021   Procedure: MEMBRANE PEEL;  Surgeon: Sherlynn Stalls, MD;  Location: [place];  Service:  Ophthalmology;  Laterality: Left;   PARS PLANA VITRECTOMY Left 03/10/2021   Procedure: TWENTY-FIVE GAUGE PARS PLANA VITRECTOMY;  Surgeon: Sherlynn Stalls, MD;  Location: [place];  Service: Ophthalmology;  Laterality: Left;   PHOTOCOAGULATION WITH LASER Left 03/10/2021   Procedure: PHOTOCOAGULATION WITH LASER;  Surgeon: Sherlynn Stalls, MD;  Location: [place];  Service: Ophthalmology;  Laterality: Left;   SHOULDER SURGERY     TEE WITHOUT CARDIOVERSION N/A 08/27/2017   Procedure: TRANSESOPHAGEAL ECHOCARDIOGRAM (TEE);  Surgeon: Sanda Klein, MD;  Location: [facility] ENDOSCOPY;  Service: Cardiovascular;  Laterality: N/A;    TONSILLECTOMY     Social History:  reports that she has never smoked. She has never used smokeless tobacco. She reports that she does not drink alcohol and does not use drugs.  Allergies  Allergen Reactions   Lisinopril Swelling and Other (See Comments)    Facial swelling  angioedema   Penicillins Hives    Has patient had a PCN reaction causing immediate rash, facial/tongue/throat swelling, SOB or lightheadedness with hypotension: yes Has patient had a PCN reaction causing severe rash involving mucus membranes or skin necrosis: No Has patient had a PCN reaction that required hospitalization No Has patient had a PCN reaction occurring within the last 10 years: No If all of the above answers are "NO", then may proceed wit Rash on buttock that makes sitting very difficult   Quetiapine Other (See Comments)    Unknown    Gabapentin Swelling and Other (See Comments)    "I thought I was having another stroke." dizziness    Family History  Problem Relation Age of Onset   Vascular Disease Mother    CAD Mother    Heart failure Mother    Heart disease Other    Cancer Other        colon, stomach, pancreatic, lung   Diabetes Other    Breast cancer Sister 30   Seizures Maternal Uncle     Prior to Admission medications   Medication Sig Start Date End Date Taking? Authorizing Provider  acetaminophen (TYLENOL) 325 MG tablet Take 1-2 tablets (325-650 mg total) by mouth every 4 (four) hours as needed for mild pain. Patient taking differently: Take 650 mg by mouth as needed for mild pain. 12/30/21   Love, Ivan Anchors, PA-C  albuterol (PROAIR HFA) 108 (90 Base) MCG/ACT inhaler INHALE 1 OR 2 PUFFS INTO THE LUNGS EVERY 6 HOURS AS NEEDED FOR WHEEZING OR SHORTNESS OF BREATH Patient taking differently: Inhale 2 puffs into the lungs as needed for shortness of breath or wheezing. 04/12/18   Angiulli, Lavon Paganini, PA-C  amLODipine (NORVASC) 5 MG tablet TAKE 1 TABLET (5 MG TOTAL) BY MOUTH DAILY. (AM) 08/22/22    Skeet Latch, MD  aspirin EC 81 MG tablet Take 1 tablet (81 mg total) by mouth daily. 12/30/21   Love, Ivan Anchors, PA-C  atorvastatin (LIPITOR) 80 MG tablet Take 80 mg by mouth at bedtime. 02/25/20   [provider]  azithromycin (ZITHROMAX) 250 MG tablet Take 1 tablet (250 mg total) by mouth daily. Take first 2 tablets together, then 1 every day until finished. Patient not taking: Reported on 06/21/2022 06/07/22   Rex Kras, PA  carboxymethylcellulose (REFRESH PLUS) 0.5 % SOLN Place 1 drop into both eyes daily.    [provider]  carvedilol (COREG) 12.5 MG tablet Take 1 tablet (12.5 mg total) by mouth 2 (two) times daily with a meal. 10/26/21   Skeet Latch, MD  Cyanocobalamin (VITAMIN B-12 PO) Take 2  capsules by mouth daily.    [provider]  diclofenac Sodium (VOLTAREN) 1 % GEL Apply 1 Application topically daily as needed (pain).    [provider]  DULoxetine (CYMBALTA) 60 MG capsule Take 60 mg by mouth at bedtime.    [provider]  fluticasone (FLONASE) 50 MCG/ACT nasal spray Place 1 spray into both nostrils as needed for allergies. 05/01/22   [provider]  glucose blood (CONTOUR NEXT TEST) test strip 1 each by Other route 2 (two) times daily. And lancets 2/day Patient taking differently: 1 each by Other route 2 (two) times daily. 04/12/18   Angiulli, Lavon Paganini, PA-C  montelukast (SINGULAIR) 10 MG tablet Take 10 mg by mouth at bedtime. Patient not taking: Reported on 06/21/2022    [provider]  OZEMPIC, 0.25 OR 0.5 MG/DOSE, 2 MG/3ML SOPN Inject 0.5 mg into the skin See admin instructions. Inject 0.5 mg subcutaneous every 10 days 04/14/22   [provider]  pantoprazole (PROTONIX) 40 MG tablet Take 1 tablet (40 mg total) by mouth daily. 12/30/21   Love, Ivan Anchors, PA-C  prednisoLONE acetate (PRED FORTE) 1 % ophthalmic suspension INSTILL 1 DROP INTO THE RIGHT EYE 4 TIMES EVERY DAY Patient not taking: Reported on  06/21/2022 03/11/22   [provider]  ticagrelor (BRILINTA) 90 MG TABS tablet Take 90 mg by mouth in the morning and at bedtime. 01/11/22   [provider]  traZODone (DESYREL) 50 MG tablet Take 1 tablet (50 mg total) by mouth at bedtime as needed for sleep. Patient not taking: Reported on 06/21/2022 12/30/21   Flora Lipps    Physical Exam: Vitals:   08/22/22 1745 08/22/22 1800 08/22/22 1815 08/22/22 1830  BP: (!) 166/82 (!) 175/84 (!) 170/87 (!) 172/91  Pulse: 78 78 78 78  Resp: '18 16 16 16  '$ Temp:      TempSrc:      SpO2: 100% 100% 100% 100%  Weight:       Constitutional: NAD, dazed appearing middle-age female laying on right side due to symptoms of severe dizziness and nausea. Eyes: lids and conjunctivae normal ENMT: Mucous membranes are moist.  Neck: normal, supple Respiratory: clear to auscultation bilaterally, no wheezing, no crackles. Normal respiratory effort. No accessory muscle use.  Cardiovascular: Regular rate and rhythm, no murmurs / rubs / gallops. No extremity edema. Abdomen: no tenderness,  Bowel sounds positive.  Musculoskeletal: no clubbing / cyanosis. No joint deformity upper and lower extremities. Good ROM, no contractures. Normal muscle tone.  Skin: no rashes, lesions, ulcers.  Neurologic: CN 2-12 grossly intact. Sensation intact. Strength 5/5 in all 4-able to dorsi flex and plantarflex her foot which was documented to be abnormal earlier by neurology.  No facial asymmetry.  Had horizontal nystagmus at rest worse with ocular movement.  Patient unable to complete figure of H testing due to provocation of her dizziness symptoms. Neuro exam is limited as pt declined movement in her bed given severe dizziness and nausea symptoms.  Psychiatric: Normal judgment and insight. Alert and oriented x 3. Normal mood. 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 Continue home atorvastatin.  -PT/OT/SLT -Frequent neuro checks and keep on telemetry  Antithrombotic - hold  until MRI results  - DVT ppx - SCDs -Allow for permissive hypertension with blood pressure treatment as needed only if systolic goes above [id number]  Chronic kidney disease (CKD), stage IV (severe) (HCC) -Remains at baseline. Baseline appears to be around 2-3   Hypertension -holding Coreg, amlodipine to allow for permissive HTN  Generalized anxiety disorder -Continue Duloxetine and Trazodone  Asthma, chronic -Continue PRN bronchodilator  Type 2 diabetes mellitus with chronic kidney disease, without long-term current use of insulin (HCC) A1C of 5/9 (06/2022) with hyperglycemia at 179 on presentation -just monitor with morning labs. Awaiting Speech evaluation as she is reporting difficulty with swallowing so will keep NPO.   Vertigo -pt developed acute vertigo with horizontal nystagmus/nausea/vomiting shortly after returning from MRI -concern could be late effect of stroke/TIA- MRI brain is still pending but pt has hx of recurrent stroke so high likelihood of recurrence -give one time dose of Compazine and Benadryl  -PRN Zofran  -Meclizine PRN        Advance Care Planning: Full   Consults: neurology  Family Communication: none at bedside  Severity of Illness: The appropriate patient status for this patient is OBSERVATION. Observation status is judged to be reasonable and necessary in order to provide the required intensity of service to ensure the patient's safety. The patient's presenting symptoms, physical exam findings, and initial radiographic and laboratory data in the context of their medical condition is felt to place them at decreased risk for further clinical deterioration. Furthermore, it  is anticipated that the patient will be medically stable for discharge from the hospital within 2 midnights of admission.   Author: Orene Desanctis, DO 08/22/2022 8:02 PM  For on call review [email or web address].

## 2022-08-22 NOTE — Assessment & Plan Note (Addendum)
-  hx of recurrent CVA/TIA on aspirin and Brilinta who presented with left sided weakness and dizziness -Presented outside window for tnkase  - MRI brain pending - CTA head and neck without LVO - obtain echocardiogram  -Obtain A1c and lipids. Continue home atorvastatin.  -PT/OT/SLT -Frequent neuro checks and keep on telemetry  Antithrombotic - hold until MRI results  - DVT ppx - SCDs -Allow for permissive hypertension with blood pressure treatment as needed only if systolic goes above 537

## 2022-08-22 NOTE — ED Notes (Signed)
Patient transported to MRI 

## 2022-08-22 NOTE — ED Notes (Signed)
Pt is currently in MRI

## 2022-08-22 NOTE — Assessment & Plan Note (Signed)
-  Continue Duloxetine and Trazodone

## 2022-08-22 NOTE — Consult Note (Signed)
NEUROLOGY CONSULTATION NOTE   Date of service: August 22, 2022 Patient Name: Christina Orozco MRN:  [id number] DOB:  21-Jan-1971 Reason for consult: "code stroke" Requesting Provider: Malvin Johns, MD History of Present Illness  Christina Orozco is a 52 y.o. female with PMH significant for CVA, PRES, anxiety/depression, seizures, CKD, TIA, on aspirin and Brilinta at home, headaches, PVD, DM, HTN who presents as a code stroke from home.  Patient brought in by EMS where she was last known well at 1230, now complaining of left-sided weakness dizziness and aphasia.  Per patient she called her friend at 73 and then after that she got in the bathtub for an unknown amount of time, then began to feel dizzy with left-sided weakness and slurred speech.  Upon arrival to ED, stroke team met patient at bridge.  Patient was alert, oriented, follow commands, denied dizziness, no dysarthria or aphasia.  Left foot weakness and decreased sensation to left leg present.  CT head and CTA imaging negative.    LKW: 1230 mRS: 1 tNKASE: No, outside of window Thrombectomy: No, no LVO present.  NIHSS: 2   ROS   Constitutional Denies weight loss, fever and chills.   HEENT Denies changes in vision and hearing.   Respiratory Denies SOB and cough.   CV Denies palpitations and CP   GI Denies abdominal pain, nausea, vomiting and diarrhea.   GU Denies dysuria and urinary frequency.   MSK Denies myalgia and joint pain.   Skin Denies rash and pruritus.  Neurological Denies headache and syncope.   Psychiatric Denies recent changes in mood. Denies anxiety and depression.    Past History   Past Medical History:  Diagnosis Date   Anemia    severe   Anxiety    Asthma    CKD (chronic kidney disease)    stage IV   DDD (degenerative disc disease), cervical    Depression    Diabetes mellitus (HCC)    Dislocated shoulder    right   GERD (gastroesophageal reflux disease)    Headache    migraines (about once a  month)   History of kidney stones    Hypertension    Neuropathy    Peripheral vascular disease ([place])    Pneumonia    Stroke [facility])    Past Surgical History:  Procedure Laterality Date   ADENOIDECTOMY     APPENDECTOMY     CERVICAL SPINE SURGERY  06/2012   C5-C6   EYE SURGERY     left eye surgery    FOOT SURGERY     KENALOG INJECTION Left 03/10/2021   Procedure: Intravitreal Avastin Injection;  Surgeon: Sherlynn Stalls, MD;  Location: [place];  Service: Ophthalmology;  Laterality: Left;   LOOP RECORDER INSERTION N/A 08/27/2017   Procedure: LOOP RECORDER INSERTION;  Surgeon: Sanda Klein, MD;  Location: [place] CV LAB;  Service: Cardiovascular;  Laterality: N/A;   MEMBRANE PEEL Left 03/10/2021   Procedure: MEMBRANE PEEL;  Surgeon: Sherlynn Stalls, MD;  Location: [place];  Service: Ophthalmology;  Laterality: Left;   PARS PLANA VITRECTOMY Left 03/10/2021   Procedure: TWENTY-FIVE GAUGE PARS PLANA VITRECTOMY;  Surgeon: Sherlynn Stalls, MD;  Location: [place];  Service: Ophthalmology;  Laterality: Left;   PHOTOCOAGULATION WITH LASER Left 03/10/2021   Procedure: PHOTOCOAGULATION WITH LASER;  Surgeon: Sherlynn Stalls, MD;  Location: [place];  Service: Ophthalmology;  Laterality: Left;   SHOULDER SURGERY     TEE WITHOUT CARDIOVERSION N/A 08/27/2017   Procedure: TRANSESOPHAGEAL ECHOCARDIOGRAM (TEE);  Surgeon:  Croitoru, Dani Gobble, MD;  Location: [place];  Service: Cardiovascular;  Laterality: N/A;   TONSILLECTOMY     Family History  Problem Relation Age of Onset   Vascular Disease Mother    CAD Mother    Heart failure Mother    Heart disease Other    Cancer Other        colon, stomach, pancreatic, lung   Diabetes Other    Breast cancer Sister 30   Seizures Maternal Uncle    Social History   Socioeconomic History   Marital status: Single    Spouse name: Not on file   Number of children: 3   Years of education: 12+   Highest education level: Some college, no degree  Occupational History    Not on file  Tobacco Use   Smoking status: Never   Smokeless tobacco: Never  Vaping Use   Vaping Use: Never used  Substance and Sexual Activity   Alcohol use: No   Drug use: No   Sexual activity: Not Currently  Other Topics Concern   Not on file  Social History Narrative   Lives with daughter   Caffeine use: "once in a blue moon"   Right handed   Social Determinants of Health   Financial Resource Strain: Not on file  Food Insecurity: No Food Insecurity (06/21/2022)   Hunger Vital Sign    Worried About Running Out of Food in the Last Year: Never true    Ran Out of Food in the Last Year: Never true  Transportation Needs: No Transportation Needs (06/21/2022)   PRAPARE - Hydrologist (Medical): No    Lack of Transportation (Non-Medical): No  Physical Activity: Not on file  Stress: Not on file  Social Connections: Not on file   Allergies  Allergen Reactions   Lisinopril Swelling and Other (See Comments)    Facial swelling  angioedema   Penicillins Hives    Has patient had a PCN reaction causing immediate rash, facial/tongue/throat swelling, SOB or lightheadedness with hypotension: yes Has patient had a PCN reaction causing severe rash involving mucus membranes or skin necrosis: No Has patient had a PCN reaction that required hospitalization No Has patient had a PCN reaction occurring within the last 10 years: No If all of the above answers are "NO", then may proceed wit Rash on buttock that makes sitting very difficult   Quetiapine Other (See Comments)    Unknown    Gabapentin Swelling and Other (See Comments)    "I thought I was having another stroke." dizziness    Medications  (Not in a hospital admission)    Vitals   Vitals:   08/22/22 1600 08/22/22 1700 08/22/22 1715 08/22/22 1719  BP:  (!) 178/97 91/80   Pulse:  80 81   Resp:   16   Temp:    98.2 F (36.8 C)  TempSrc:    Oral  SpO2:  100% 100%   Weight: 64.4 kg        Body  mass index is 25.97 kg/m.  Physical Exam   General: Laying comfortably in bed; in no acute distress.  HENT: Normal oropharynx and mucosa. Normal external appearance of ears and nose.  Neck: Supple, no pain or tenderness  CV: No JVD. No peripheral edema.  Pulmonary: Symmetric Chest rise. Normal respiratory effort.  Abdomen: Soft to touch, non-tender.  Ext: No cyanosis, edema, or deformity  Skin: No rash. Normal palpation of skin.   Musculoskeletal: Normal  digits and nails by inspection.   Neurologic Examination  Mental status/Cognition: Alert, oriented to self, place, month and year, good attention.  Speech/language: Fluent, comprehension intact, object naming intact, repetition intact.  Cranial nerves:   CN II Pupils equal and reactive to light, no VF deficits    CN III,IV,VI EOM intact, no gaze preference or deviation, no nystagmus    CN V normal sensation in V1, V2, and V3 segments bilaterally    CN VII no asymmetry, no nasolabial fold flattening    CN VIII normal hearing to speech    CN IX & X normal palatal elevation, no uvular deviation   CN XI 5/5 head turn and 5/5 shoulder shrug bilaterally    CN XII midline tongue protrusion    Motor:  Normal bulk and tone.  No tremors present. 5 out of 5 in all extremities, no drift seen.  However, left foot is is 0/5 dorsi flexion and plantarflexion as patient is unable to flex/extend foot at all.   Sensation: Intact to light touch throughout.  Decreased in left leg.   Coordination/Complex Motor:  - Finger to Nose: ataxia present in LUE - Heel to shin: intact bilaterally  - Rapid alternating movement: ataxia present in LUE  - Gait: deferred  Labs   CBC:  Recent Labs  Lab 08/22/22 1652  HGB 9.9*  HCT 29.0*    Basic Metabolic Panel:  Lab Results  Component Value Date   NA 142 08/22/2022   K 4.1 08/22/2022   CO2 25 06/22/2022   GLUCOSE 175 (H) 08/22/2022   BUN 36 (H) 08/22/2022   CREATININE 2.90 (H) 08/22/2022    CALCIUM 9.3 06/22/2022   GFRNONAA 23 (L) 06/22/2022   GFRAA 27 (L) 06/17/2020   Lipid Panel:  Lab Results  Component Value Date   LDLCALC 75 06/21/2022   HgbA1c:  Lab Results  Component Value Date   HGBA1C 5.9 (H) 06/21/2022   Urine Drug Screen:     Component Value Date/Time   LABOPIA NONE DETECTED 06/22/2022 0842   COCAINSCRNUR NONE DETECTED 06/22/2022 0842   LABBENZ NONE DETECTED 06/22/2022 0842   AMPHETMU NONE DETECTED 06/22/2022 0842   THCU NONE DETECTED 06/22/2022 0842   LABBARB NONE DETECTED 06/22/2022 0842    Alcohol Level     Component Value Date/Time   ETH <10 06/20/2022 1843    CT Head without contrast(Personally reviewed): No evidence of acute large vascular territory infarct or acute hemorrhage.  ASPECTS is 10. Multiple remote lacunar infarcts and chronic microvascular ischemic disease.  CT angio Head and Neck with contrast(Personally reviewed): No intracranial large vessel occlusion or significant stenosis. No hemodynamically significant stenosis in the neck.  MRI Brain(Personally reviewed): pending   Impression   Christina Orozco is a 52 y.o. female with PMH significant for CVA, PRES, anxiety/depression, seizures, CKD, TIA, on aspirin and Brilinta at home, headaches, PVD, DM, HTN who presents as a code stroke from home.  Patient denied dizziness, no dysarthria, left side was not weak as a whole.  However, patient was unable to flex or extend her foot at all.  Patient did describe some pain when she lifted the left leg.  When asked where it hurt she just said that the leg feels dead.  Due to the patient having multiple previous strokes presenting with acute onsets of with her symptoms, presentation lends itself to a TIA.  We will get an MRI to rule out stroke. Recommend admitting her for further TIA/stroke workup.  Primary Diagnosis:  TIA  Secondary Diagnosis: Secondary to risk factors: Hypertensive emergency, hypertension, diabetes, previous  strokes  Recommendations  - Frequent Neuro checks per stroke unit protocol - MRI Brain stroke protocol - Vascular imaging - CTA completed - TTE - Lipid panel - Statin - will be started if LDL>70 or otherwise medically indicated - A1C - Antithrombotic - hold until MRI results  - DVT ppx - SCDs - SBP goal - <200, PRN labetalol if HR>60 and PRN Hydralazine if HR<60 - Telemetry monitoring for arrhythmia - 72h - Swallow screen - will be performed prior to PO intake - Stroke education - will be given - PT/OT/SLP - Dispo: Tele  ______________________________________________________________________   Thank you for the opportunity to take part in the care of this patient. If you have any further questions, please contact the neurology consultation attending.  Pt seen by Neuro NP/APP and later by MD. Note/plan to be edited by MD as needed.    Otelia Santee, DNP, AGACNP-BC [facility] Please use AMION for pager and EPIC for messaging   NEUROHOSPITALIST ADDENDUM Performed a face to face diagnostic evaluation.   I have reviewed the contents of history and physical exam as documented by PA/ARNP/Resident and agree with above documentation.  I have discussed and formulated the above plan as documented. Edits to the note have been made as needed.  Impression/Key exam findings/Plan: 54F with stroke risk factors including prior strokes, PRES 2/2 HTN, DM2 who p/w left leg weakness. LKW was 1230. She walked to the bath tub and unable to get up. She was outside the tnkase window unfortunately by the time we got her CT head and attempted to control her blood pressure. Not a candidate for thrombectomy 2/2 no LVO.  Exam shows unable to dorsiflex or planterflex her left foot, unable to invert or evert. She can, move her left leg at the knee and ankle fine however. Symptoms improving her patient.  Recommend admission for stroke workup. This episode could have been a stroke/TIA given her  prior hx of strokes.  Donnetta Simpers, MD [facility] [phone]   If 7pm to 7am, please call on call as listed on AMION.

## 2022-08-23 ENCOUNTER — Encounter (HOSPITAL_COMMUNITY): Payer: Self-pay | Admitting: Family Medicine

## 2022-08-23 ENCOUNTER — Observation Stay (HOSPITAL_COMMUNITY): Payer: 59

## 2022-08-23 DIAGNOSIS — I63412 Cerebral infarction due to embolism of left middle cerebral artery: Secondary | ICD-10-CM | POA: Diagnosis not present

## 2022-08-23 DIAGNOSIS — I69354 Hemiplegia and hemiparesis following cerebral infarction affecting left non-dominant side: Secondary | ICD-10-CM | POA: Diagnosis present

## 2022-08-23 DIAGNOSIS — Z8673 Personal history of transient ischemic attack (TIA), and cerebral infarction without residual deficits: Secondary | ICD-10-CM | POA: Diagnosis not present

## 2022-08-23 DIAGNOSIS — R531 Weakness: Secondary | ICD-10-CM | POA: Diagnosis not present

## 2022-08-23 DIAGNOSIS — E663 Overweight: Secondary | ICD-10-CM

## 2022-08-23 DIAGNOSIS — E1140 Type 2 diabetes mellitus with diabetic neuropathy, unspecified: Secondary | ICD-10-CM | POA: Diagnosis present

## 2022-08-23 DIAGNOSIS — Z6825 Body mass index (BMI) 25.0-25.9, adult: Secondary | ICD-10-CM | POA: Diagnosis not present

## 2022-08-23 DIAGNOSIS — I69322 Dysarthria following cerebral infarction: Secondary | ICD-10-CM | POA: Diagnosis not present

## 2022-08-23 DIAGNOSIS — Z803 Family history of malignant neoplasm of breast: Secondary | ICD-10-CM | POA: Diagnosis not present

## 2022-08-23 DIAGNOSIS — F411 Generalized anxiety disorder: Secondary | ICD-10-CM | POA: Diagnosis present

## 2022-08-23 DIAGNOSIS — Z87442 Personal history of urinary calculi: Secondary | ICD-10-CM | POA: Diagnosis not present

## 2022-08-23 DIAGNOSIS — I129 Hypertensive chronic kidney disease with stage 1 through stage 4 chronic kidney disease, or unspecified chronic kidney disease: Secondary | ICD-10-CM | POA: Diagnosis present

## 2022-08-23 DIAGNOSIS — E1136 Type 2 diabetes mellitus with diabetic cataract: Secondary | ICD-10-CM | POA: Diagnosis present

## 2022-08-23 DIAGNOSIS — I6389 Other cerebral infarction: Secondary | ICD-10-CM

## 2022-08-23 DIAGNOSIS — E1169 Type 2 diabetes mellitus with other specified complication: Secondary | ICD-10-CM | POA: Diagnosis not present

## 2022-08-23 DIAGNOSIS — E1122 Type 2 diabetes mellitus with diabetic chronic kidney disease: Secondary | ICD-10-CM | POA: Diagnosis present

## 2022-08-23 DIAGNOSIS — E1142 Type 2 diabetes mellitus with diabetic polyneuropathy: Secondary | ICD-10-CM | POA: Diagnosis present

## 2022-08-23 DIAGNOSIS — R29702 NIHSS score 2: Secondary | ICD-10-CM | POA: Diagnosis present

## 2022-08-23 DIAGNOSIS — J45909 Unspecified asthma, uncomplicated: Secondary | ICD-10-CM | POA: Diagnosis present

## 2022-08-23 DIAGNOSIS — I10 Essential (primary) hypertension: Secondary | ICD-10-CM | POA: Diagnosis not present

## 2022-08-23 DIAGNOSIS — Z833 Family history of diabetes mellitus: Secondary | ICD-10-CM | POA: Diagnosis not present

## 2022-08-23 DIAGNOSIS — R270 Ataxia, unspecified: Secondary | ICD-10-CM | POA: Diagnosis not present

## 2022-08-23 DIAGNOSIS — G8194 Hemiplegia, unspecified affecting left nondominant side: Secondary | ICD-10-CM | POA: Diagnosis present

## 2022-08-23 DIAGNOSIS — E1151 Type 2 diabetes mellitus with diabetic peripheral angiopathy without gangrene: Secondary | ICD-10-CM | POA: Diagnosis present

## 2022-08-23 DIAGNOSIS — R42 Dizziness and giddiness: Secondary | ICD-10-CM | POA: Diagnosis present

## 2022-08-23 DIAGNOSIS — I6302 Cerebral infarction due to thrombosis of basilar artery: Secondary | ICD-10-CM | POA: Diagnosis not present

## 2022-08-23 DIAGNOSIS — I6381 Other cerebral infarction due to occlusion or stenosis of small artery: Secondary | ICD-10-CM | POA: Diagnosis present

## 2022-08-23 DIAGNOSIS — D638 Anemia in other chronic diseases classified elsewhere: Secondary | ICD-10-CM | POA: Diagnosis not present

## 2022-08-23 DIAGNOSIS — E785 Hyperlipidemia, unspecified: Secondary | ICD-10-CM | POA: Diagnosis present

## 2022-08-23 DIAGNOSIS — Z880 Allergy status to penicillin: Secondary | ICD-10-CM | POA: Diagnosis not present

## 2022-08-23 DIAGNOSIS — Z8249 Family history of ischemic heart disease and other diseases of the circulatory system: Secondary | ICD-10-CM | POA: Diagnosis not present

## 2022-08-23 DIAGNOSIS — R1310 Dysphagia, unspecified: Secondary | ICD-10-CM | POA: Diagnosis present

## 2022-08-23 DIAGNOSIS — R112 Nausea with vomiting, unspecified: Secondary | ICD-10-CM | POA: Diagnosis present

## 2022-08-23 DIAGNOSIS — N184 Chronic kidney disease, stage 4 (severe): Secondary | ICD-10-CM | POA: Diagnosis present

## 2022-08-23 DIAGNOSIS — Z7982 Long term (current) use of aspirin: Secondary | ICD-10-CM | POA: Diagnosis not present

## 2022-08-23 DIAGNOSIS — F32A Depression, unspecified: Secondary | ICD-10-CM | POA: Diagnosis present

## 2022-08-23 DIAGNOSIS — G4733 Obstructive sleep apnea (adult) (pediatric): Secondary | ICD-10-CM | POA: Diagnosis present

## 2022-08-23 DIAGNOSIS — F331 Major depressive disorder, recurrent, moderate: Secondary | ICD-10-CM | POA: Diagnosis not present

## 2022-08-23 DIAGNOSIS — H5509 Other forms of nystagmus: Secondary | ICD-10-CM | POA: Diagnosis present

## 2022-08-23 DIAGNOSIS — Z79899 Other long term (current) drug therapy: Secondary | ICD-10-CM | POA: Diagnosis not present

## 2022-08-23 DIAGNOSIS — K219 Gastro-esophageal reflux disease without esophagitis: Secondary | ICD-10-CM | POA: Diagnosis present

## 2022-08-23 DIAGNOSIS — I639 Cerebral infarction, unspecified: Secondary | ICD-10-CM | POA: Diagnosis not present

## 2022-08-23 DIAGNOSIS — R4701 Aphasia: Secondary | ICD-10-CM | POA: Diagnosis present

## 2022-08-23 DIAGNOSIS — D631 Anemia in chronic kidney disease: Secondary | ICD-10-CM | POA: Diagnosis present

## 2022-08-23 DIAGNOSIS — I69320 Aphasia following cerebral infarction: Secondary | ICD-10-CM | POA: Diagnosis not present

## 2022-08-23 DIAGNOSIS — I69393 Ataxia following cerebral infarction: Secondary | ICD-10-CM | POA: Diagnosis not present

## 2022-08-23 DIAGNOSIS — Z888 Allergy status to other drugs, medicaments and biological substances status: Secondary | ICD-10-CM | POA: Diagnosis not present

## 2022-08-23 LAB — LIPID PANEL
Cholesterol: 121 mg/dL (ref 0–200)
HDL: 77 mg/dL (ref >40)
LDL Cholesterol: 35 mg/dL (ref 0–99)
Total CHOL/HDL Ratio: 1.6 RATIO
Triglycerides: 43 mg/dL (ref <150)
VLDL: 9 mg/dL (ref 0–40)

## 2022-08-23 LAB — HEMOGLOBIN A1C
Hgb A1c MFr Bld: 6.3 % — ABNORMAL HIGH (ref 4.8–5.6)
Mean Plasma Glucose: 134 mg/dL

## 2022-08-23 LAB — ECHOCARDIOGRAM COMPLETE
AR max vel: 2.36 cm2
AV Area VTI: 2.26 cm2
AV Area mean vel: 2.4 cm2
AV Mean grad: 3 mmHg
AV Peak grad: 5.3 mmHg
Ao pk vel: 1.16 m/s
Area-P 1/2: 6.32 cm2
Calc EF: 57.8 %
MV M vel: 2.52 m/s
MV Peak grad: 25.4 mmHg
S' Lateral: 2.2 cm
Single Plane A2C EF: 64.5 %
Single Plane A4C EF: 56.3 %
Weight: 2271.62 oz

## 2022-08-23 NOTE — Progress Notes (Signed)
[facility] Progress Note  Patient: Christina Orozco    [id number]  DOA: 08/22/2022    Date of Service: the patient was seen and examined on 08/24/2022  Brief hospital course: 52 year old female with past medical history of stage IV CKD, PVD, diabetes mellitus now well-controlled, hypertension and seizure with previous hospitalizations for stroke and TIA and currently on aspirin, after finishing Brilinta, who presented to the emergency room on 3/12 with complaints of dizziness when moving her head.  While in the emergency room evaluated by neurology, noted to have difficulty with dorsiflexion/plantarflexion of her left foot, which had since resolved.  Workup by MRI noted punctate areas of acute/early subacute ischemia of the lateral margin of left basal ganglia.  Patient brought in for further evaluation.     Assessment and Plan: Acute CVA (cerebrovascular accident) [facility]) Difficult case, since patient's risk factors are already well-controlled and she has been on Brilinta and aspirin, now on aspirin.  Discussed with neurology.  They are recommending restarting Brilinta x 4 weeks.  Consider trial to assess for sleep apnea as underlying cause/factor.  Vertigo -pt developed acute vertigo with horizontal nystagmus/nausea/vomiting shortly after returning from MRI, secondary to CVA.  Trying IV Antivert, antinausea medications.  Hopefully inpatient rehab will help with this.  Hypertension -holding Coreg, amlodipine to allow for permissive HTN  Chronic kidney disease (CKD), stage IV (severe) ([place]) -Remains at baseline. Baseline appears to be around 2-3   Generalized anxiety disorder -Continue Duloxetine and Trazodone  Type 2 diabetes mellitus with chronic kidney disease, without long-term current use of insulin (HCC) A1C of 5/9 (06/2022) with hyperglycemia at 179 on presentation -just monitor with morning labs. Awaiting Speech evaluation as she is reporting difficulty with swallowing so  will keep NPO.   Asthma, chronic -Continue PRN bronchodilator  Overweight (BMI 25.0-29.9) Meets criteria with BMI greater than 25       Body mass index is 25.97 kg/m.        Consultants: Neurology  Procedures: Pending echocardiogram  Antimicrobials: None  Code Status: Full code   Subjective: As mentioned above, patient's issues with her leg have resolved, but continued have significant dizziness when attempting to turn her head in either direction.  Objective: Systolic blood pressure in the 160s, allowing for permissive hypertension Vitals:   08/24/22 1129 08/24/22 1509  BP: (!) 175/78 (!) 169/87  Pulse: 82 84  Resp: 16 16  Temp: 98.3 F (36.8 C) 98.4 F (36.9 C)  SpO2: 100% 100%    Intake/Output Summary (Last 24 hours) at 08/24/2022 1655 Last data filed at 08/24/2022 0755 Gross per 24 hour  Intake 360 ml  Output --  Net 360 ml   Filed Weights   08/22/22 1600  Weight: 64.4 kg   Body mass index is 25.97 kg/m.  Exam:  General: Alert and oriented x 3, mild distress from dizziness HEENT: Normocephalic, atraumatic, mucous membranes are moist Cardiovascular: Regular rate and rhythm, S1-S2 Respiratory: Clear to auscultation bilaterally Abdomen: Soft, nontender, nondistended, positive bowel sounds Musculoskeletal: No clubbing or cyanosis or edema Skin: No skin breaks, tears or lesions Psychiatry: Appropriate, no evidence of psychoses Neurology: Persistent nystagmus when attempting to look to the left  Data Reviewed: A1c 5.9, LDL 35 with HDL 77 and triglycerides of 43, creatinine 2.9  Disposition:  Status is: Inpatient Remains inpatient appropriate because:  -Evaluation by CIR -Decision on sleep study trial    Anticipated discharge date: 3/15  Family Communication: Will call family DVT Prophylaxis: SCD's Start: 08/22/22 1949  Author: Annita Brod ,MD 08/24/2022 4:55 PM  To reach On-call, see care teams to locate the attending and  reach out via [email or web address]. Between 7PM-7AM, please contact night-coverage If you still have difficulty reaching the attending provider, please page the [facility] (Director on Call) for [facility] on amion for assistance.

## 2022-08-23 NOTE — ED Notes (Signed)
I tried to call twice to give report but no answer

## 2022-08-23 NOTE — Progress Notes (Addendum)
STROKE TEAM PROGRESS NOTE   INTERVAL HISTORY No family at the bedside.  Patient states she noted left-sided weakness and dizziness yesterday.  She currently ambulates with a walker.  She does live by herself, but her daughter comes home from college for breaks.  She is still having dizziness, however her weakness has improved.  She has not had any episodes or TIA symptoms since January 2024. She has had a loop recorder and TEE in 2019 that we both unremarkable MRI brain shows tiny punctate left subcortical lacunar infarct.  No infarct is noted on the right side.  Multiple old lacunar infarcts and changes of small vessel disease Vitals:   08/22/22 2301 08/23/22 0153 08/23/22 0320 08/23/22 0812  BP: (!) 173/86 (!) 183/93 (!) 170/86 (!) 168/83  Pulse: 92 89 97 100  Resp: '17 20 18 18  '$ Temp: 98.1 F (36.7 C) 98.5 F (36.9 C)  98.4 F (36.9 C)  TempSrc: Oral Oral Oral Oral  SpO2: 100% 100% 100% 100%  Weight:       CBC:  Recent Labs  Lab 08/22/22 1644 08/22/22 1652  WBC 5.5  --   NEUTROABS 3.3  --   HGB 9.5* 9.9*  HCT 29.3* 29.0*  MCV 90.7  --   PLT 252  --    Basic Metabolic Panel:  Recent Labs  Lab 08/22/22 1644 08/22/22 1652  NA 138 142  K 4.1 4.1  CL 111 110  CO2 21*  --   GLUCOSE 179* 175*  BUN 38* 36*  CREATININE 2.61* 2.90*  CALCIUM 9.1  --    Lipid Panel:  Recent Labs  Lab 08/23/22 0247  CHOL 121  TRIG 43  HDL 77  CHOLHDL 1.6  VLDL 9  LDLCALC 35   HgbA1c: No results for input(s): "HGBA1C" in the last 168 hours. Urine Drug Screen:  Recent Labs  Lab 08/22/22 1643  LABOPIA NONE DETECTED  COCAINSCRNUR NONE DETECTED  LABBENZ NONE DETECTED  AMPHETMU NONE DETECTED  THCU NONE DETECTED  LABBARB NONE DETECTED    Alcohol Level  Recent Labs  Lab 08/22/22 1645  ETH <10    IMAGING past 24 hours MR BRAIN WO CONTRAST  Result Date: 08/22/2022 CLINICAL DATA:  Acute neurologic deficit EXAM: MRI HEAD WITHOUT CONTRAST TECHNIQUE: Multiplanar, multiecho pulse  sequences of the brain and surrounding structures were obtained without intravenous contrast. COMPARISON:  06/21/2022 FINDINGS: Brain: Punctate focus of acute/early subacute ischemia at the lateral margin of the left basal ganglia. Multiple chronic microhemorrhages in a predominantly central distribution. There is multifocal hyperintense T2-weighted signal within the white matter. Parenchymal volume and CSF spaces are normal. 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 The midline structures are normal. Vascular: Normal flow voids. Skull and upper cervical spine: Normal marrow signal. Sinuses/Orbits: Bilateral mastoid effusions. Normal orbits and paranasal sinuses. Other: None IMPRESSION: 1. Punctate focus of acute/early subacute ischemia at the lateral margin of the left basal ganglia. No acute hemorrhage or mass effect. 2. Multiple chronic microhemorrhages in a predominantly central distribution, consistent with chronic hypertensive angiopathy. 3. 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 Electronically Signed   By: Ulyses Jarred M.D.   On: 08/22/2022 19:39   CT ANGIO HEAD NECK W WO CM (CODE STROKE)  Result Date: 08/22/2022 CLINICAL DATA:  Stroke suspected EXAM: CT ANGIOGRAPHY HEAD AND NECK TECHNIQUE: Multidetector CT imaging of the head and neck was performed using the standard protocol during bolus administration of intravenous contrast. Multiplanar CT image reconstructions and MIPs were obtained to evaluate  the vascular anatomy. Carotid stenosis measurements (when applicable) are obtained utilizing NASCET criteria, using the distal internal carotid diameter as the denominator. RADIATION DOSE REDUCTION: This exam was performed according to the departmental dose-optimization program which includes automated exposure control, adjustment of the mA and/or kV according to patient size and/or use of iterative reconstruction technique. CONTRAST:  42m OMNIPAQUE IOHEXOL 350 MG/ML SOLN COMPARISON:  Same day CT  head, MRI Head/MRA head 03/22/23 FINDINGS: CT HEAD FINDINGS See same day CT brain for intracranial findings. No contrast enhancing lesion is visualized. CTA NECK FINDINGS Aortic arch: Standard branching. Imaged portion shows no evidence of aneurysm or dissection. No significant stenosis of the major arch vessel origins. Right carotid system: No evidence of dissection, stenosis (50% or greater), or occlusion. Left carotid system: No evidence of dissection, stenosis (50% or greater), or occlusion. Vertebral arteries: Left dominant. No evidence of dissection, stenosis (50% or greater), or occlusion. Skeleton: Status post C5-C6 ACDF Other neck: Negative. Upper chest: Negative. Review of the MIP images confirms the above findings CTA HEAD FINDINGS Anterior circulation: No significant stenosis, proximal occlusion, aneurysm, or vascular malformation. Posterior circulation: No significant stenosis, proximal occlusion, aneurysm, or vascular malformation. Venous sinuses: As permitted by contrast timing, patent. Anatomic variants: None Review of the MIP images confirms the above findings IMPRESSION: 1. No intracranial large vessel occlusion or significant stenosis. 2. No hemodynamically significant stenosis in the neck. Electronically Signed   By: HMarin RobertsM.D.   On: 08/22/2022 17:23   CT HEAD CODE STROKE WO CONTRAST  Result Date: 08/22/2022 CLINICAL DATA:  Code stroke.  Neuro deficit, acute, stroke suspected EXAM: CT HEAD WITHOUT CONTRAST TECHNIQUE: Contiguous axial images were obtained from the base of the skull through the vertex without intravenous contrast. RADIATION DOSE REDUCTION: This exam was performed according to the departmental dose-optimization program which includes automated exposure control, adjustment of the mA and/or kV according to patient size and/or use of iterative reconstruction technique. COMPARISON:  None Available. FINDINGS: Brain: Remote bilateral thalamic lacunar infarcts. Remote probable  perforator infarct in the right internal capsule and bilateral corona radiata. No evidence of acute large vascular territory infarct. Additional patchy white matter hypodensities, nonspecific but compatible with chronic microvascular ischemic disease. No evidence of acute hemorrhage, mass lesion midline shift or hydrocephalus. Vascular: No hyperdense vessel identified. Skull: No acute fracture. Sinuses/Orbits: Clear sinuses.  No acute orbital findings. Other: Small left mastoid effusion. ASPECTS ([facility] Program Early CT Score) total score (0-10 with 10 being normal): 10. IMPRESSION: 1. No evidence of acute large vascular territory infarct or acute hemorrhage. ASPECTS is 10. 2. Multiple remote lacunar infarcts and chronic microvascular ischemic disease. Code stroke imaging results were communicated on 08/22/2022 at 4:59 pm to provider Dr. KLorrin Goodellvia secure text paging. Electronically Signed   By: FMargaretha SheffieldM.D.   On: 08/22/2022 16:59    PHYSICAL EXAM  Constitutional: Appears well-developed and well-nourished.  Middle-aged African-American lady Cardiovascular: Normal rate and regular rhythm.  Respiratory: Effort normal, non-labored breathing  Neuro: Mental Status: Patient is awake, alert, oriented to person, place, month, year, and situation. Patient is able to give a clear and coherent history. No signs of aphasia or neglect Cranial Nerves: II: Visual Fields are full. Pupils are equal, round, and reactive to light.   III,IV, VI: EOMI without ptosis or diploplia.  V: Facial sensation is symmetric to temperature VII: Facial movement is symmetric resting and smiling VIII: Hearing is intact to voice X: Palate elevates symmetrically XI: Shoulder shrug  is symmetric. XII: Tongue protrudes midline without atrophy or fasciculations.  Motor: Tone is normal. Bulk is normal.  5/5 strength was present in all four extremities.  Diminished fine finger movements on the left.  Orbits right  or left upper extremity. Sensory: Diminished sensation in the left lower extremity Cerebellar: Ataxia present in left upper extremity    ASSESSMENT/PLAN Ms. Lanitra Wideman is a 52 y.o. female with history of CVA, PRES, anxiety/depression, seizures, CKD, TIA, on aspirin and Brilinta at home, headaches, PVD, DM, HTN who presents as a code stroke from home.  She is currently on aspirin and Brilinta and follows with [facility] neurolog[facility].  She was supposed to have an appointment today 3/13 however she has rescheduled it for the 22nd.  She does report that she is still having dizziness, but her weakness has improved.  Additionally she was seen January  Right hemispheric TIA from small vessel disease.  Silent left subcortical lacunar infarct Code Stroke CT head No acute abnormality. Multiple remote lacunar infarcts and chronic microvascular ischemic disease. CTA head & neck No LVO MRI  Punctate focus of acute/early subacute ischemia at the lateral margin of the left basal ganglia.  LDL 35 HgbA1c 5.9 Echo pending VTE prophylaxis -Lovenox    Diet   Diet NPO time specified Except for: Sips with Meds   aspirin 81 mg daily and Brilinta (ticagrelor) 90 mg bid prior to admission, now on aspirin 81 mg daily.  Recommend aspirin and Brilinta for 4 weeks and then stop Brilinta and stay on aspirin alone. 2023- P2Y12- 39 Consider trial Therapy recommendations: Pending Disposition: Pending  Hypertension Home meds: Norvasc 5 mg, Coreg 12.5 mg Stable Permissive hypertension (OK if < 220/120) but gradually normalize in 5-7 days Long-term BP goal normotensive  Hyperlipidemia Home meds: Atorvastatin 80 mg, resumed in hospital LDL 35, goal < 70 Continue statin at discharge  Diabetes type II Controlled Home meds: Ozempic HgbA1c 5.9, goal < 7.0 CBGs Recent Labs    08/22/22 1646  GLUCAP 169*    SSI  Other Stroke Risk Factors Hx stroke/TIA 08/2015 CSF neg, protein 58 07/2017 MRI showed left  pontine infarct, EF 65 to 70%, carotid Doppler and MRA negative.  LDL 43, A1c 12.5, patient put on DAPT and Lipitor 40. 08/2017 Loop recorder placed, no A. fib found 02/27/2019 woke up with complete vision loss, gradually vision resumed OD, still has better vision OS.  MRI at that time showed 3 mm acute to early subacute infarct in the dorsal midline of the pons negative MRI head and neck. 07/2019 loop recorder explanted 05/01/2020 MRI showed new T2 hyperintense lesion of the right frontal white matter likely reflect an area of late subacute or chronic infarction. 06/19/2020 Subcentimeter likely subacute infarct at the left lateral aspect of the pons.  MRA head unremarkable, carotid Doppler unremarkable, EF 70 to 75%.  LDL 63, A1c 6.2.  Put on aspirin and Brilinta for 1 months and then back to aspirin and the Plavix.  Continue Lipitor 80. 11/10/2021 - MRI showed possible small right cerebral peduncle infarcts.  MRA negative.  Carotid Doppler unremarkable.  EF 60 to 65%, LDL 34, A1c 6.5, discharged with ASA and brilinta 90 twice daily.  Outpatient follow-up with [facility] neurology, continued on aspirin and Brilinta 60 twice daily. 06/20/2022- TIA with confusion, EEG negative, negative for stroke  Other Active Problems GERD-continue PPI Depression-continue Cymbalta  Hospital day # 0  Patient seen and examined by NP/APP with MD. MD to update note as needed.  Janine Ores, DNP, FNP-BC [facility] Pager: [phone]  STROKE MD NOTE :  I have personally obtained history,examined this patient, reviewed notes, independently viewed imaging studies, participated in medical decision making and plan of care.ROS completed by me personally and pertinent positives fully documented  I have made any additions or clarifications directly to the above note. Agree with note above.  Patient presented with dizziness and transient left leg weakness numbness which appears to have improved.  MRI shows a silent left  basal ganglia lacunar infarct but no acute ischemia on the right side.  Patient has had multiple episodes of TIAs and strokes over the last 10 years with extensive workup.  TEE in 2019 was negative.  Loop recorder so far has not yet shown paroxysmal A-fib.  She has failed aspirin and Plavix and currently aspirin and Brilinta.  Recommend aspirin and Brilinta for 4 weeks followed by aspirin alone her risk factors seem adequately controlled.  She appears to be at risk for sleep apnea and is interested in considering participation in the sleep smart study.  She will be given written information to review and decide.  It was made clear to her that study participation is voluntary and she will get the same excellent care whether she decides to participate. Greater than 50% time during this 50-minute visit was spent in counseling and coordination of care about her TIA and lacunar silent infarct discussion about evaluation and prevention and treatment and answering questions. Antony Contras, MD Medical Director [facility] Pager: [phone] 08/23/2022 1:16 PM ' To contact Stroke Continuity provider, please refer to [email or web address] After hours, contact General Neurology

## 2022-08-23 NOTE — Plan of Care (Signed)

## 2022-08-23 NOTE — TOC Initial Note (Signed)
Transition of Care [facility]) - Initial/Assessment Note    Patient Details  Name: Christina Orozco MRN: [id number] Date of Birth: 1970-08-19  Transition of [facility]) CM/SW Contact:    Pollie Friar, RN Phone Number: 08/23/2022, 3:52 PM  Clinical Narrative:                 Pt is from home alone. She currently is active with [facility] for PT/OT at home.  Pt states currently her daughter provides transportation for her but it is a burden on her daughter. She has medicaid but states the wait time for the medicaid transportation is very long and it makes her anxiety too bad. She was interested in getting set up with SCAT. SCAT application completed with the patient and will be sent in.  Pt uses [facility] and they use bubble packs for her.  Pt interested in getting set up with caregiver through her medicaid. CM has completed the form and will get it sent into [employer]. Currently the recommendations are for CIR. Pt is not sure about CIR and talked about d/cing home, however she is a 2+ assist currently. TOC following.  Expected Discharge Plan: IP Rehab Facility Barriers to Discharge: Continued Medical Work up   Patient Goals and CMS Choice   CMS Medicare.gov Compare Post Acute Care list provided to:: Patient Choice offered to / list presented to : Patient      Expected Discharge Plan and Services   Discharge Planning Services: CM Consult Post Acute Care Choice: IP Rehab Living arrangements for the past 2 months: Apartment                                      Prior Living Arrangements/Services Living arrangements for the past 2 months: Apartment Lives with:: Self Patient language and need for interpreter reviewed:: Yes Do you feel safe going back to the place where you live?: Yes      Need for Family Participation in Patient Care: Yes (Comment) Care giver support system in place?: No (comment) Current home services: DME (rollator/ shower seat) Criminal  Activity/Legal Involvement Pertinent to Current Situation/Hospitalization: No - Comment as needed  Activities of Daily Living Home Assistive Devices/Equipment: Cane (specify quad or straight), Walker (specify type) ADL Screening (condition at time of admission) Patient's cognitive ability adequate to safely complete daily activities?: Yes Is the patient deaf or have difficulty hearing?: Yes Does the patient have difficulty seeing, even when wearing glasses/contacts?: Yes Does the patient have difficulty concentrating, remembering, or making decisions?: Yes Patient able to express need for assistance with ADLs?: Yes Does the patient have difficulty dressing or bathing?: Yes Independently performs ADLs?: Yes (appropriate for developmental age) Does the patient have difficulty walking or climbing stairs?: Yes Weakness of Legs: Left Weakness of Arms/Hands: Left  Permission Sought/Granted                  Emotional Assessment Appearance:: Appears stated age Attitude/Demeanor/Rapport: Engaged Affect (typically observed): Accepting Orientation: : Oriented to Self, Oriented to Place, Oriented to  Time, Oriented to Situation   Psych Involvement: No (comment)  Admission diagnosis:  Acute ischemic stroke ([place]) [I63.9] Left-sided weakness [R53.1] Acute CVA (cerebrovascular accident) [facility]) [I63.9] Patient Active Problem List   Diagnosis Date Noted   Acute CVA (cerebrovascular accident) ([place]) 08/23/2022   Overweight (BMI 25.0-29.9) 08/23/2022   Acute left-sided weakness 08/22/2022   Asthma, chronic 08/22/2022  TIA (transient ischemic attack) 06/20/2022   Numbness and tingling in left hand 03/02/2022   Gait disturbance, post-stroke 03/02/2022   AKI (acute kidney injury) ([place]) [id number]   Acute embolic stroke ([place]) [id number]   Hypernatremia 12/20/2021   Chronic kidney disease (CKD), stage IV (severe) (H[facility]) 12/19/2021   Left-sided weakness and visual deficits  12/19/2021   Vision  abnormalities 12/19/2021   Stroke ([place]) 11/10/2021   Depression 07/05/2020   CVA (cerebral vascular accident) ([place]) 06/21/2020   CVA (cerebrovascular accident) ([place]) 06/19/2020   Generalized anxiety disorder 03/01/2019   Abnormality of gait 10/14/2018   Dizziness and giddiness 09/13/2018   Debility 06/26/2018   Chronic nonintractable headache 0000000   Diastolic dysfunction    Congestion of nasal sinus    Pneumonia of left lower lobe due to infectious organism    SOB (shortness of breath)    Candidiasis    Anemia    Hypoalbuminemia due to protein-calorie malnutrition ([place])    Hypertension    Type 2 diabetes mellitus with chronic kidney disease, without long-term current use of insulin ([place])    PRES (posterior reversible encephalopathy syndrome)    Hypokalemia    History of CVA with residual deficit    History of seizure 03/25/2018   Bilateral carpal tunnel syndrome 02/14/2018   Arthritis 10/31/2017   Asthma 10/31/2017   Constipation 10/31/2017   Kidney stone 10/31/2017   Anemia, chronic disease 08/09/2017   Hyperlipidemia 07/26/2017   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[place]) 07/25/2017   Anxiety as acute reaction to exceptional stress 04/23/2017   Insomnia 10/08/2016   Neuropathy 06/07/2016   Gait instability    Lesion of pons    Slurred speech    Vertigo 04/30/2016   Hyperglycemia 04/30/2016   Chronic kidney disease (CKD), stage III (moderate) ([place]) 04/30/2016   Ataxia 04/30/2016   Chronic right shoulder pain 04/03/2016   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 [place] [address] Phone: [phone] Fax:  [phone]     Social Determinants of Health (SDOH) Social History: SDOH Screenings   Food Insecurity: No Food Insecurity (08/23/2022)  Housing: Low Risk  (08/23/2022)  Transportation Needs: No Transportation Needs (08/23/2022)  Utilities: Not At Risk (08/23/2022)  Depression (PHQ2-9): Low Risk  (04/27/2022)  Tobacco Use: Low Risk  (08/23/2022)   SDOH Interventions:     Readmission Risk Interventions     No data to display

## 2022-08-23 NOTE — Evaluation (Addendum)
Physical Therapy Evaluation Patient Details Name: Christina Orozco MRN: [id number] DOB: Feb 03, 1971 Today's Date: 08/23/2022  History of Present Illness  Pt is a 52 y.o. F who presents with left sided weakness and slurred speech. CTH negative for acute abnormality. MRI with punctuate focus of acute/early subacute ischemia at the lateral margin of the left basal ganglia. PMH: hypertension, hyperlipidemia, diabetes, chronic kidney disease, prior strokes, history of seizures possibly secondary to PRES (2019) versus underlying epilepsy  Clinical Impression  PTA, pt lives with her spouse, uses a cane for mobility and is independent with ADL's. Pt presents with decreased functional mobility secondary to dizziness, impaired standing balance, ataxia. Pt with abnormal smooth pursuit visual tracking and left beating horizontal nystagmus. Benefits from gaze stabilization; visual targets placed in room to facilitate transitional movements. Pt requiring min-mod assist to stand from edge of bed with bilateral handheld assist. Pt lateral weight shifting, pt demonstrating increased ataxia and dizziness. Suspect steady progress given age, motivation and PLOF. Recommend AIR at d/c to address deficits and maximize functional independence.   Recommendations for follow up therapy are one component of a multi-disciplinary discharge planning process, led by the attending physician.  Recommendations may be updated based on patient status, additional functional criteria and insurance authorization.  Follow Up Recommendations Acute inpatient rehab (3hours/day)      Assistance Recommended at Discharge Frequent or constant Supervision/Assistance  Patient can return home with the following  A lot of help with walking and/or transfers;A little help with bathing/dressing/bathroom;Assistance with cooking/housework;Assist for transportation;Help with stairs or ramp for entrance    Equipment Recommendations Other (comment) (TBA)   Recommendations for Other Services  Rehab consult    Functional Status Assessment Patient has had a recent decline in their functional status and demonstrates the ability to make significant improvements in function in a reasonable and predictable amount of time.     Precautions / Restrictions Precautions Precautions: Fall Restrictions Weight Bearing Restrictions: No      Mobility  Bed Mobility Overal bed mobility: Needs Assistance Bed Mobility: Supine to Sit, Sit to Supine     Supine to sit: Supervision Sit to supine: Min assist   General bed mobility comments: HOB elevated, use of gaze stabilization during transition, no physical assist. MinA for LE management back into bed    Transfers Overall transfer level: Needs assistance Equipment used: 2 person hand held assist Transfers: Sit to/from Stand Sit to Stand: Min assist, Mod assist, +2 physical assistance           General transfer comment: initially min A +2, pt with ataxic-like movements and increased unsteadiness in standing requiring mod A +2 to remain upright, increased dizziness with lateral weight shifting    Ambulation/Gait                  Stairs            Wheelchair Mobility    Modified Rankin (Stroke Patients Only) Modified Rankin (Stroke Patients Only) Pre-Morbid Rankin Score: No significant disability Modified Rankin: Severe disability     Balance Overall balance assessment: Needs assistance Sitting-balance support: Feet supported Sitting balance-Leahy Scale: Good Sitting balance - Comments: close supervision   Standing balance support: Bilateral upper extremity supported, During functional activity Standing balance-Leahy Scale: Poor Standing balance comment: reliant on bilateral HHA                             Pertinent Vitals/Pain Pain Assessment Pain Assessment:  Faces Faces Pain Scale: Hurts a little bit Pain Location: dizziness Pain Descriptors /  Indicators: Discomfort Pain Intervention(s): Monitored during session, Limited activity within patient's tolerance    Home Living Family/patient expects to be discharged to:: Private residence Living Arrangements: Spouse/significant other Available Help at Discharge: Family;Available PRN/intermittently Type of Home: Apartment Home Access: Level entry       Home Layout: One level Home Equipment: Conservation officer, nature (2 wheels);Cane - single point;Shower seat;Grab bars - tub/shower;Hand held shower head;Grab bars - toilet      Prior Function Prior Level of Function : Needs assist             Mobility Comments: Uses cane, limited community ambulator. received HHOT and HHRN ADLs Comments: able to perform ADLs and light IADLs     Hand Dominance   Dominant Hand: Right    Extremity/Trunk Assessment   Upper Extremity Assessment Upper Extremity Assessment: Overall WFL for tasks assessed    Lower Extremity Assessment Lower Extremity Assessment: RLE deficits/detail;LLE deficits/detail RLE Deficits / Details: Strength 5/5 RLE Coordination: decreased gross motor LLE Deficits / Details: Strength 5/5 LLE Coordination: decreased gross motor    Cervical / Trunk Assessment Cervical / Trunk Assessment: Normal  Communication   Communication: No difficulties  Cognition Arousal/Alertness: Awake/alert Behavior During Therapy: WFL for tasks assessed/performed Overall Cognitive Status: Within Functional Limits for tasks assessed                                 General Comments: WFL for basic mobility        General Comments General comments (skin integrity, edema, etc.): VSS on RA    Exercises     Assessment/Plan    PT Assessment Patient needs continued PT services  PT Problem List Decreased activity tolerance;Decreased balance;Decreased mobility       PT Treatment Interventions DME instruction;Gait training;Stair training;Functional mobility training;Therapeutic  activities;Therapeutic exercise;Balance training;Patient/family education    PT Goals (Current goals can be found in the Care Plan section)  Acute Rehab PT Goals Patient Stated Goal: for dizziness to improve PT Goal Formulation: With patient Time For Goal Achievement: 09/06/22 Potential to Achieve Goals: Good    Frequency Min 4X/week     Co-evaluation PT/OT/SLP Co-Evaluation/Treatment: Yes Reason for Co-Treatment: For patient/therapist safety;To address functional/ADL transfers PT goals addressed during session: Mobility/safety with mobility OT goals addressed during session: ADL's and self-care       AM-PAC PT "6 Clicks" Mobility  Outcome Measure Help needed turning from your back to your side while in a flat bed without using bedrails?: A Little Help needed moving from lying on your back to sitting on the side of a flat bed without using bedrails?: A Little Help needed moving to and from a bed to a chair (including a wheelchair)?: A Lot Help needed standing up from a chair using your arms (e.g., wheelchair or bedside chair)?: A Lot Help needed to walk in hospital room?: Total Help needed climbing 3-5 steps with a railing? : Total 6 Click Score: 12    End of Session Equipment Utilized During Treatment: Gait belt Activity Tolerance: Other (comment) (limited by dizziness) Patient left: in bed;with call bell/phone within reach;with bed alarm set Nurse Communication: Mobility status PT Visit Diagnosis: Unsteadiness on feet (R26.81);Ataxic gait (R26.0);Difficulty in walking, not elsewhere classified (R26.2);Dizziness and giddiness (R42)    Time: [id number] PT Time Calculation (min) (ACUTE ONLY): 18 min   Charges:  PT Evaluation $PT Eval Moderate Complexity: 1 Mod          Wyona Almas, [place], DPT Acute Rehabilitation Services Office [place] 08/23/2022, 1:07 PM

## 2022-08-23 NOTE — Hospital Course (Signed)
52 year old female with past medical history of stage IV CKD, PVD, diabetes mellitus now well-controlled, hypertension and seizure with previous hospitalizations for stroke and TIA and currently on aspirin, after finishing Brilinta, who presented to the emergency room on 3/12 with complaints of dizziness when moving her head.  While in the emergency room evaluated by neurology, noted to have difficulty with dorsiflexion/plantarflexion of her left foot, which had since resolved.  Workup by MRI noted punctate areas of acute/early subacute ischemia of the lateral margin of left basal ganglia.  Patient brought in for further evaluation.

## 2022-08-23 NOTE — Plan of Care (Signed)
  Problem: Education: Goal: Knowledge of disease or condition will improve Outcome: Progressing   Problem: Coping: Goal: Will identify appropriate support needs Outcome: Progressing   Problem: Nutrition: Goal: Risk of aspiration will decrease Outcome: Progressing Goal: Dietary intake will improve Outcome: Progressing   Problem: Ischemic Stroke/TIA Tissue Perfusion: Goal: Complications of ischemic stroke/TIA will be minimized Outcome: Not Progressing   Problem: Self-Care: Goal: Ability to participate in self-care as condition permits will improve Outcome: Not Progressing

## 2022-08-23 NOTE — Assessment & Plan Note (Addendum)
Difficult case, since patient's risk factors are already well-controlled and she has been on Brilinta and aspirin, now on aspirin.  Discussed with neurology.  They are recommending restarting Brilinta x 4 weeks.  Patient completed sleep study throughout, as below.  Accepted by inpatient rehab, bed available 3/16

## 2022-08-23 NOTE — Progress Notes (Signed)
Chaplain responded to [facility] consult for unit chaplain Kenton Kingfisher who is out today. Chaplain asked open ended questions to facilitate story telling and emotional expression and to build rapport. [name] shared that in the last 8 years she has had 10 strokes and a couple of TIAs as well as a seizure which resulted in a prolonged hospitalization. She was feeling unusual yesterday and though she reports EMS did not think it was a stroke, she is glad she decided to come to the hospital. Chaplain utilized reflective listening to normalize emotions, explore coping skills, identify sources of strength, and patient's goals. Christina Orozco has lost a lot of independence with each clinical event and yet she maintains hope and reports that her faith is a significant source of strength. She says she prayed that God would allow her to live until she is 46 so that she can see her grandchildren grow up. She is currently feeling a sensation of dizziness that is akin to a falling sensation, which also brings about a lot of nausea. She is working hard to stay very still and is grateful that the zofran brings some relief although not complete abatement. She has good support in her daughters although she is reluctant to lean to heavily on them as she does not want them to endanger their own work to be at her aid, so she is largely alone. I provided prayer in her tradition at her request.   Please page as further needs arise.  Donald Prose. Elyn Peers, M.Div. [facility] Chaplain Pager [phone] Office [phone]      08/23/22 1211  Spiritual Encounters  Type of Visit Initial  Care provided to: Patient  Referral source Nurse (RN/NT/LPN)  Reason for visit Routine spiritual support  Spiritual Framework  Presenting Themes Coping tools;Significant life change;Goals in life/care;Meaning/purpose/sources of inspiration;Impactful experiences and emotions;Courage hope and growth  Values/beliefs Christian  Community/Connection Family   Patient Stress Factors Loss of control;Loss;Exhausted  Goals  Self/Personal Goals Live to see her grandchildren grow up  [name], nauseated, feels like she's falling  Interventions  Spiritual Care Interventions Made Established relationship of care and support;Compassionate presence;Reflective listening;Normalization of emotions  Intervention Outcomes  Outcomes Reduced isolation;Connection to spiritual care

## 2022-08-23 NOTE — Evaluation (Signed)
Clinical/Bedside Swallow Evaluation Patient Details  Name: Christina Orozco MRN: [id number] Date of Birth: [birth year]  Today's Date: 08/23/2022 Time: SLP Start Time (ACUTE ONLY): 0840 SLP Stop Time (ACUTE ONLY): [id number] SLP Time Calculation (min) (ACUTE ONLY): 15 min  Past Medical History:  Past Medical History:  Diagnosis Date   Anemia    severe   Anxiety    Asthma    CKD (chronic kidney disease)    stage IV   DDD (degenerative disc disease), cervical    Depression    Diabetes mellitus ([place])    Dislocated shoulder    right   GERD (gastroesophageal reflux disease)    Headache    migraines (about once a month)   History of kidney stones    Hypertension    Neuropathy    Peripheral vascular disease ([place])    Pneumonia    Stroke [facility])    Past Surgical History:  Past Surgical History:  Procedure Laterality Date   ADENOIDECTOMY     APPENDECTOMY     CERVICAL SPINE SURGERY  06/2012   C5-C6   EYE SURGERY     left eye surgery    FOOT SURGERY     KENALOG INJECTION Left 03/10/2021   Procedure: Intravitreal Avastin Injection;  Surgeon: Sherlynn Stalls, MD;  Location: [place];  Service: Ophthalmology;  Laterality: Left;   LOOP RECORDER INSERTION N/A 08/27/2017   Procedure: LOOP RECORDER INSERTION;  Surgeon: Sanda Klein, MD;  Location: [place] [facility];  Service: Cardiovascular;  Laterality: N/A;   MEMBRANE PEEL Left 03/10/2021   Procedure: MEMBRANE PEEL;  Surgeon: Sherlynn Stalls, MD;  Location: [place];  Service: Ophthalmology;  Laterality: Left;   PARS PLANA VITRECTOMY Left 03/10/2021   Procedure: TWENTY-FIVE GAUGE PARS PLANA VITRECTOMY;  Surgeon: Sherlynn Stalls, MD;  Location: [place];  Service: Ophthalmology;  Laterality: Left;   PHOTOCOAGULATION WITH LASER Left 03/10/2021   Procedure: PHOTOCOAGULATION WITH LASER;  Surgeon: Sherlynn Stalls, MD;  Location: [place];  Service: Ophthalmology;  Laterality: Left;   SHOULDER SURGERY     TEE WITHOUT CARDIOVERSION N/A 08/27/2017   Procedure:  TRANSESOPHAGEAL ECHOCARDIOGRAM (TEE);  Surgeon: Sanda Klein, MD;  Location: [facility];  Service: Cardiovascular;  Laterality: N/A;   TONSILLECTOMY     HPI:  Christina Orozco is a 52 y.o. female with medical history significant of CVA/TIA on aspirin and Brilinta, PRES secondary to HTN, seizure, T2DM, CKD 4, PVD, anxiety/depression who presents with left sided weakness and dizziness. MRI puntate focus of acute/early subacute ischemia at the lateral margin of left basal ganglis. No acute hemoorrhage or masse effect,, multiple chronic microhemorrhages in a predominately centarl distribution, old bilateral thalamic infarcts. CTA head and neck without LVO. MBS 05/2022 normal oropharyngeal ability without penetration or aspiration, Barium tablet halted at distal esophgaus without clearance. Pt underwent neck CT showed no tongue mass identified. Pt has undergone adenoidectomy and tonsillectomy. SLP recommended possible ENT consult.    Assessment / Plan / Recommendation  Clinical Impression  Pt complained of dizziness but able to sit upright for assessment. She stated upcoming appointment tomorrow with GI to further assess esophageal function. She also mentioned ENT stating her "tonsils may be larger now." Had MBS 05/2022 that showed hesitation of barium pill without clearance. Pt endorses coughng with eating and globus sensation pharyngeal and chest with meals. She demonstrates mildly decreased left sided facial movement and states it hurts to chew on her right side of oral cavity. She took small bites with mildly decreased mandiular movement possibly  due to discomfort. There were no signs of aspiration and coordination of swallow and respiration appeared intact with sequential straw sips thin. Discussed texture options and Dys 3 (chopped) meats was agreed upon, Thin liquids, pills with thin. Educated/reviewed strategies for esophgaus to sit upright, remain upright after meals and alternate liquids and solids  to faciliate esophageal transit. ST will continue to follow for safety and upgrade texture when appropriate. SLP Visit Diagnosis: Dysphagia, unspecified (R13.10)    Aspiration Risk  Mild aspiration risk    Diet Recommendation Dysphagia 3 (Mech soft);Thin liquid   Liquid Administration via: Cup;Straw Medication Administration: Whole meds with liquid Supervision: Patient able to self feed Compensations: Minimize environmental distractions;Slow rate;Small sips/bites Postural Changes: Remain upright for at least 30 minutes after po intake;Seated upright at 90 degrees    Other  Recommendations Oral Care Recommendations: Oral care BID    Recommendations for follow up therapy are one component of a multi-disciplinary discharge planning process, led by the attending physician.  Recommendations may be updated based on patient status, additional functional criteria and insurance authorization.  Follow up Recommendations  (TBD)      Assistance Recommended at Discharge    Functional Status Assessment Patient has had a recent decline in their functional status and demonstrates the ability to make significant improvements in function in a reasonable and predictable amount of time.  Frequency and Duration min 2x/week  2 weeks       Prognosis Prognosis for improved oropharyngeal function: Good      Swallow Study   General Date of Onset: 08/22/22 HPI: Christina Orozco is a 52 y.o. female with medical history significant of CVA/TIA on aspirin and Brilinta, PRES secondary to HTN, seizure, T2DM, CKD 4, PVD, anxiety/depression who presents with left sided weakness and dizziness. MRI puntate focus of acute/early subacute ischemia at the lateral margin of left basal ganglis. No acute hemoorrhage or masse effect,, multiple chronic microhemorrhages in a predominately centarl distribution, old bilateral thalamic infarcts. CTA head and neck without LVO. MBS 05/2022 normal oropharyngeal ability without  penetration or aspiration, Barium tablet halted at distal esophgaus without clearance. Pt underwent neck CT showed no tongue mass identified. Pt has undergone adenoidectomy and tonsillectomy. SLP recommended possible ENT consult. Type of Study: Bedside Swallow Evaluation Previous Swallow Assessment:  (see HPI) Diet Prior to this Study: NPO Temperature Spikes Noted: No Respiratory Status: Room air History of Recent Intubation: No Behavior/Cognition: Alert;Cooperative;Pleasant mood Oral Cavity Assessment: Within Functional Limits Oral Care Completed by SLP: No Oral Cavity - Dentition: Adequate natural dentition Vision: Functional for self-feeding Self-Feeding Abilities: Able to feed self Patient Positioning: Upright in bed Baseline Vocal Quality: Normal Volitional Cough: Strong Volitional Swallow: Able to elicit    Oral/Motor/Sensory Function Overall Oral Motor/Sensory Function: Mild impairment Facial ROM: Reduced left Facial Symmetry: Abnormal symmetry left Facial Strength: Reduced left Lingual ROM: Within Functional Limits Lingual Symmetry: Within Functional Limits   Ice Chips Ice chips: Not tested   Thin Liquid Thin Liquid: Within functional limits Presentation: Cup;Straw    Nectar Thick Nectar Thick Liquid: Not tested   Honey Thick Honey Thick Liquid: Not tested   Puree Puree: Within functional limits   Solid     Solid: Impaired Oral Phase Impairments:  (slow mastication,) Oral Phase Functional Implications:  (decreased mandibular movement possibly due to small bites) Pharyngeal Phase Impairments:  (none)      Zackerie Sara, Orbie Pyo 08/23/2022,9:13 AM

## 2022-08-23 NOTE — Evaluation (Signed)
Occupational Therapy Evaluation Patient Details Name: Christina Orozco MRN: [id number] DOB: 1971/01/05 Today's Date: 08/23/2022   History of Present Illness Pt is a 52 y.o. F who presents with left sided weakness and slurred speech. CTH negative for acute abnormality. MRI with punctuate focus of acute/early subacute ischemia at the lateral margin of the left basal ganglia. PMH: hypertension, hyperlipidemia, diabetes, chronic kidney disease, prior strokes, history of seizures possibly secondary to PRES (2019) versus underlying epilepsy   Clinical Impression   Pt reports use of cane at baseline for mobility and is ind with ADLs, lives with spouse who can assist at d/c. Pt primarily limited by dizziness during session, needing set up -mod A for ADLs, min A for bed mobility, and min-mod A for transfer with 2 person HHA. Pt reports dizziness improved with gaze fixated on certain point on wall, pt with increased dizziness/unsteadiness with lateral weight shifting in standing. Pt presenting with impairments listed below, will follow acutely. Recommend AIR at d/c.     Recommendations for follow up therapy are one component of a multi-disciplinary discharge planning process, led by the attending physician.  Recommendations may be updated based on patient status, additional functional criteria and insurance authorization.   Follow Up Recommendations  Acute inpatient rehab (3hours/day)     Assistance Recommended at Discharge Frequent or constant Supervision/Assistance  Patient can return home with the following A lot of help with walking and/or transfers;A lot of help with bathing/dressing/bathroom;Assistance with cooking/housework;Direct supervision/assist for medications management;Direct supervision/assist for financial management;Assist for transportation;Help with stairs or ramp for entrance    Functional Status Assessment  Patient has had a recent decline in their functional status and  demonstrates the ability to make significant improvements in function in a reasonable and predictable amount of time.  Equipment Recommendations  Other (comment) (defer)    Recommendations for Other Services PT consult;Rehab consult     Precautions / Restrictions Precautions Precautions: Fall Restrictions Weight Bearing Restrictions: No      Mobility Bed Mobility Overal bed mobility: Needs Assistance Bed Mobility: Supine to Sit, Sit to Supine     Supine to sit: Min assist Sit to supine: Min assist        Transfers Overall transfer level: Needs assistance Equipment used: 2 person hand held assist Transfers: Sit to/from Stand Sit to Stand: Min assist, Mod assist, +2 physical assistance           General transfer comment: initially min A +2, pt with ataxic-like movements and increased unsteadiness in standing requiring mod A +2 to remain upright, increased dizziness with lateral weight shifting      Balance Overall balance assessment: Needs assistance Sitting-balance support: Feet supported Sitting balance-Leahy Scale: Good Sitting balance - Comments: close supervision   Standing balance support: Reliant on assistive device for balance Standing balance-Leahy Scale: Poor Standing balance comment: reliant on external support                           ADL either performed or assessed with clinical judgement   ADL Overall ADL's : Needs assistance/impaired Eating/Feeding: Set up;Sitting   Grooming: Set up;Sitting   Upper Body Bathing: Sitting;Moderate assistance   Lower Body Bathing: Sitting/lateral leans;Maximal assistance   Upper Body Dressing : Moderate assistance;Sitting   Lower Body Dressing: Maximal assistance;Sitting/lateral leans   Toilet Transfer: Moderate assistance;+2 for safety/equipment;+2 for physical assistance   Toileting- Clothing Manipulation and Hygiene: Moderate assistance       Functional mobility  during ADLs: Moderate  assistance;+2 for physical assistance;Minimal assistance       Vision Baseline Vision/History: 4 Cataracts (in R eye) Vision Assessment?: Yes Eye Alignment: Within Functional Limits Ocular Range of Motion: Within Functional Limits Alignment/Gaze Preference: Within Defined Limits Tracking/Visual Pursuits: Decreased smoothness of horizontal tracking Additional Comments: difficulty identifying stimuli in peripheral visual field when gaze is fixated at center, able to read small print on food menu, episode of horizontal nystagmus noted sitting EOB,pt closing eyes frequently will further assess     Perception Perception Perception Tested?: No   Praxis Praxis Praxis tested?: Not tested    Pertinent Vitals/Pain Pain Assessment Pain Assessment: Faces Pain Score: 5  Faces Pain Scale: Hurts little more Pain Location: dizziness Pain Descriptors / Indicators: Discomfort Pain Intervention(s): Limited activity within patient's tolerance, Monitored during session     Hand Dominance Right   Extremity/Trunk Assessment Upper Extremity Assessment Upper Extremity Assessment: Generalized weakness   Lower Extremity Assessment Lower Extremity Assessment: Defer to PT evaluation   Cervical / Trunk Assessment Cervical / Trunk Assessment: Normal   Communication Communication Communication: No difficulties   Cognition Arousal/Alertness: Awake/alert Behavior During Therapy: WFL for tasks assessed/performed Overall Cognitive Status: Within Functional Limits for tasks assessed                                 General Comments: able to provide PLOF,     General Comments  VSS on RA    Exercises     Shoulder Instructions      Home Living Family/patient expects to be discharged to:: Private residence Living Arrangements: Spouse/significant other Available Help at Discharge: Family;Available PRN/intermittently Type of Home: Apartment Home Access: Level entry     Home  Layout: One level     Bathroom Shower/Tub: Tub/shower unit;Curtain   Bathroom Toilet: Handicapped height Bathroom Accessibility: Yes   Home Equipment: Conservation officer, nature (2 wheels);Cane - single point;Shower seat;Grab bars - tub/shower;Hand held shower head;Grab bars - toilet          Prior Functioning/Environment Prior Level of Function : Needs assist             Mobility Comments: Uses cane, limited community ambulator. received HHOT and HHRN ADLs Comments: able to perform ADLs and light IADLs        OT Problem List: Decreased strength;Decreased range of motion;Decreased activity tolerance;Decreased safety awareness;Impaired balance (sitting and/or standing);Impaired vision/perception      OT Treatment/Interventions: Self-care/ADL training;Therapeutic exercise;Energy conservation;DME and/or AE instruction;Therapeutic activities;Visual/perceptual remediation/compensation;Patient/family education;Balance training;Cognitive remediation/compensation    OT Goals(Current goals can be found in the care plan section) Acute Rehab OT Goals Patient Stated Goal: none stated OT Goal Formulation: With patient Time For Goal Achievement: 09/06/22 Potential to Achieve Goals: Good ADL Goals Pt Will Perform Upper Body Dressing: with modified independence;sitting Pt Will Perform Lower Body Dressing: with modified independence;sit to/from stand;sitting/lateral leans Pt Will Transfer to Toilet: with modified independence;ambulating;regular height toilet Pt Will Perform Tub/Shower Transfer: with modified independence;ambulating;shower seat  OT Frequency: Min 3X/week    Co-evaluation PT/OT/SLP Co-Evaluation/Treatment: Yes Reason for Co-Treatment: For patient/therapist safety;To address functional/ADL transfers PT goals addressed during session: Mobility/safety with mobility OT goals addressed during session: ADL's and self-care      AM-PAC OT "6 Clicks" Daily Activity     Outcome Measure  Help from another person eating meals?: A Little Help from another person taking care of personal grooming?: A Little Help from another person  toileting, which includes using toliet, bedpan, or urinal?: A Lot Help from another person bathing (including washing, rinsing, drying)?: A Lot Help from another person to put on and taking off regular upper body clothing?: A Lot Help from another person to put on and taking off regular lower body clothing?: A Lot 6 Click Score: 14   End of Session Equipment Utilized During Treatment: Gait belt Nurse Communication: Mobility status  Activity Tolerance: Patient tolerated treatment well Patient left: in bed;with call bell/phone within reach;with bed alarm set  OT Visit Diagnosis: Unsteadiness on feet (R26.81);Other abnormalities of gait and mobility (R26.89);Muscle weakness (generalized) (M62.81)                Time: [id number] OT Time Calculation (min): 20 min Charges:  OT General Charges $OT Visit: 1 Visit OT Evaluation $OT Eval Moderate Complexity: 1 Mod  Jahzion Brogden K, OTD, OTR/L SecureChat Preferred Acute Rehab [phone]   Renaye Rakers Koonce 08/23/2022, 12:43 PM

## 2022-08-23 NOTE — Progress Notes (Signed)
   08/23/22 1500  Spiritual Encounters  Type of Visit Initial  Care provided to: Patient  Referral source Patient request  Reason for visit Routine spiritual support  OnCall Visit No   Ch responded to request for emotional and spiritual support. Pt wanted Ch to offer a word of prayer. Ch accompanied pt in praying a prayer for healing. No follow-up needed at this time.

## 2022-08-23 NOTE — Progress Notes (Signed)
  Echocardiogram 2D Echocardiogram has been performed.  Christina Orozco 08/23/2022, 2:34 PM

## 2022-08-23 NOTE — Assessment & Plan Note (Signed)
Meets criteria with BMI greater than 25 

## 2022-08-23 NOTE — ED Notes (Signed)
Charge nurse called floor and confirmed pt is going upstairs

## 2022-08-23 NOTE — ED Notes (Signed)
ED TO INPATIENT HANDOFF REPORT  ED Nurse Name and Phone #: Brunetta Jeans  S Name/Age/Gender [facility] 52 y.o. female Room/Bed: 004C/004C  Code Status   Code Status: Full Code  Home/SNF/Other Home Patient oriented to: self Is this baseline? Yes   Triage Complete: Triage complete  Chief Complaint Left-sided weakness [R53.1]  Triage Note Pt BIB EMS due to code stroke. Pt has hx of TIA and strokes. Pt LSN 1230. Pt started feeling unwell, weka on left side, and speaking changes per family. Pt is axox4.    Allergies Allergies  Allergen Reactions   Lisinopril Swelling and Other (See Comments)    Facial swelling  angioedema   Penicillins Hives    Has patient had a PCN reaction causing immediate rash, facial/tongue/throat swelling, SOB or lightheadedness with hypotension: yes Has patient had a PCN reaction causing severe rash involving mucus membranes or skin necrosis: No Has patient had a PCN reaction that required hospitalization No Has patient had a PCN reaction occurring within the last 10 years: No If all of the above answers are "NO", then may proceed wit Rash on buttock that makes sitting very difficult   Quetiapine Other (See Comments)    Unknown    Gabapentin Swelling and Other (See Comments)    "I thought I was having another stroke." dizziness    Level of Care/Admitting Diagnosis ED Disposition     ED Disposition  Admit   Condition  --   [place]: [place] [[id number]]  Level of Care: Telemetry Medical [104]  May place patient in observation at [facility] or [place] if equivalent level of care is available:: No  Covid Evaluation: Asymptomatic - no recent exposure (last 10 days) testing not required  Diagnosis: Left-sided weakness [id number]  Admitting Physician: Orene Desanctis [id number]  Attending Physician: Orene Desanctis [id number]          B Medical/Surgery History Past Medical History:  Diagnosis Date   Anemia     severe   Anxiety    Asthma    CKD (chronic kidney disease)    stage IV   DDD (degenerative disc disease), cervical    Depression    Diabetes mellitus ([place])    Dislocated shoulder    right   GERD (gastroesophageal reflux disease)    Headache    migraines (about once a month)   History of kidney stones    Hypertension    Neuropathy    Peripheral vascular disease ([place])    Pneumonia    Stroke [facility])    Past Surgical History:  Procedure Laterality Date   ADENOIDECTOMY     APPENDECTOMY     CERVICAL SPINE SURGERY  06/2012   C5-C6   EYE SURGERY     left eye surgery    FOOT SURGERY     KENALOG INJECTION Left 03/10/2021   Procedure: Intravitreal Avastin Injection;  Surgeon: Sherlynn Stalls, MD;  Location: [place];  Service: Ophthalmology;  Laterality: Left;   LOOP RECORDER INSERTION N/A 08/27/2017   Procedure: LOOP RECORDER INSERTION;  Surgeon: Sanda Klein, MD;  Location: [place] CV LAB;  Service: Cardiovascular;  Laterality: N/A;   MEMBRANE PEEL Left 03/10/2021   Procedure: MEMBRANE PEEL;  Surgeon: Sherlynn Stalls, MD;  Location: [place];  Service: Ophthalmology;  Laterality: Left;   PARS PLANA VITRECTOMY Left 03/10/2021   Procedure: TWENTY-FIVE GAUGE PARS PLANA VITRECTOMY;  Surgeon: Sherlynn Stalls, MD;  Location: [place];  Service: Ophthalmology;  Laterality:  Left;   PHOTOCOAGULATION WITH LASER Left 03/10/2021   Procedure: PHOTOCOAGULATION WITH LASER;  Surgeon: Sherlynn Stalls, MD;  Location: [place];  Service: Ophthalmology;  Laterality: Left;   SHOULDER SURGERY     TEE WITHOUT CARDIOVERSION N/A 08/27/2017   Procedure: TRANSESOPHAGEAL ECHOCARDIOGRAM (TEE);  Surgeon: Sanda Klein, MD;  Location: [facility] ENDOSCOPY;  Service: Cardiovascular;  Laterality: N/A;   TONSILLECTOMY       A IV Location/Drains/Wounds Patient Lines/Drains/Airways Status     Active Line/Drains/Airways     Name Placement date Placement time Site Days   Peripheral IV 08/22/22 20 G Anterior;Left Forearm 08/22/22   2000  Forearm  1            Intake/Output Last 24 hours No intake or output data in the 24 hours ending 08/23/22 0026  Labs/Imaging Results for orders placed or performed during the hospital encounter of 08/22/22 (from the past 48 hour(s))  Urine rapid drug screen (hosp performed)     Status: None   Collection Time: 08/22/22  4:43 PM  Result Value Ref Range   Opiates NONE DETECTED NONE DETECTED   Cocaine NONE DETECTED NONE DETECTED   Benzodiazepines NONE DETECTED NONE DETECTED   Amphetamines NONE DETECTED NONE DETECTED   Tetrahydrocannabinol NONE DETECTED NONE DETECTED   Barbiturates NONE DETECTED NONE DETECTED    Comment: (NOTE) DRUG SCREEN FOR MEDICAL PURPOSES ONLY.  IF CONFIRMATION IS NEEDED FOR ANY PURPOSE, NOTIFY LAB WITHIN 5 DAYS.  LOWEST DETECTABLE LIMITS FOR URINE DRUG SCREEN Drug Class                     Cutoff (ng/mL) Amphetamine and metabolites    1000 Barbiturate and metabolites    200 Benzodiazepine                 200 Opiates and metabolites        300 Cocaine and metabolites        300 THC                            50 Performed at [facility], [place] [address], [place], [place] [address]   Urinalysis, Routine w reflex microscopic -Urine, Clean Catch     Status: Abnormal   Collection Time: 08/22/22  4:43 PM  Result Value Ref Range   Color, Urine STRAW (A) YELLOW   APPearance HAZY (A) CLEAR   Specific Gravity, Urine 1.016 1.005 - 1.030   pH 5.0 5.0 - 8.0   Glucose, UA NEGATIVE NEGATIVE mg/dL   Hgb urine dipstick SMALL (A) NEGATIVE   Bilirubin Urine NEGATIVE NEGATIVE   Ketones, ur NEGATIVE NEGATIVE mg/dL   Protein, ur 100 (A) NEGATIVE mg/dL   Nitrite NEGATIVE NEGATIVE   Leukocytes,Ua SMALL (A) NEGATIVE   RBC / HPF 0-5 0 - 5 RBC/hpf   WBC, UA 11-20 0 - 5 WBC/hpf   Bacteria, UA RARE (A) NONE SEEN   Squamous Epithelial / HPF 0-5 0 - 5 /HPF    Comment: Performed at [facility], [place] [address] [place], [place] [address]  Protime-INR      Status: None   Collection Time: 08/22/22  4:44 PM  Result Value Ref Range   Prothrombin Time 13.1 11.4 - 15.2 seconds   INR 1.0 0.8 - 1.2    Comment: (NOTE) INR goal varies based on device and disease states. Performed at [facility], [place] [address],  [place], [place] [address]   APTT     Status: Abnormal   Collection Time: 08/22/22  4:44 PM  Result Value Ref Range   aPTT 22 (L) 24 - 36 seconds    Comment: Performed at [facility] [address], [place], [place] [address]  CBC     Status: Abnormal   Collection Time: 08/22/22  4:44 PM  Result Value Ref Range   WBC 5.5 4.0 - 10.5 K/uL   RBC 3.23 (L) 3.87 - 5.11 MIL/uL   Hemoglobin 9.5 (L) 12.0 - 15.0 g/dL   HCT 29.3 (L) 36.0 - 46.0 %   MCV 90.7 80.0 - 100.0 fL   MCH 29.4 26.0 - 34.0 pg   MCHC 32.4 30.0 - 36.0 g/dL   RDW 14.4 11.5 - 15.5 %   Platelets 252 150 - 400 K/uL   nRBC 0.0 0.0 - 0.2 %    Comment: Performed at [facility], [place] [address], [place], [place] [address]  Differential     Status: None   Collection Time: 08/22/22  4:44 PM  Result Value Ref Range   Neutrophils Relative % 59 %   Neutro Abs 3.3 1.7 - 7.7 K/uL   Lymphocytes Relative 33 %   Lymphs Abs 1.8 0.7 - 4.0 K/uL   Monocytes Relative 5 %   Monocytes Absolute 0.3 0.1 - 1.0 K/uL   Eosinophils Relative 2 %   Eosinophils Absolute 0.1 0.0 - 0.5 K/uL   Basophils Relative 1 %   Basophils Absolute 0.0 0.0 - 0.1 K/uL   Immature Granulocytes 0 %   Abs Immature Granulocytes 0.02 0.00 - 0.07 K/uL    Comment: Performed at [facility] [address], [place], [place] [address]  Comprehensive metabolic panel     Status: Abnormal   Collection Time: 08/22/22  4:44 PM  Result Value Ref Range   Sodium 138 135 - 145 mmol/L   Potassium 4.1 3.5 - 5.1 mmol/L   Chloride 111 98 - 111 mmol/L   CO2 21 (L) 22 - 32 mmol/L   Glucose, Bld 179 (H) 70 - 99 mg/dL    Comment: Glucose reference range applies only to samples taken after fasting for at  least 8 hours.   BUN 38 (H) 6 - 20 mg/dL   Creatinine, Ser 2.61 (H) 0.44 - 1.00 mg/dL   Calcium 9.1 8.9 - 10.3 mg/dL   Total Protein 6.7 6.5 - 8.1 g/dL   Albumin 3.5 3.5 - 5.0 g/dL   AST 20 15 - 41 U/L   ALT 13 0 - 44 U/L   Alkaline Phosphatase 44 38 - 126 U/L   Total Bilirubin 0.3 0.3 - 1.2 mg/dL   GFR, Estimated 22 (L) >60 mL/min    Comment: (NOTE) Calculated using the CKD-EPI Creatinine Equation (2021)    Anion gap 6 5 - 15    Comment: Performed at [facility] [address] [place], [place] [address]  Ethanol     Status: None   Collection Time: 08/22/22  4:45 PM  Result Value Ref Range   Alcohol, Ethyl (B) <10 <10 mg/dL    Comment: (NOTE) Lowest detectable limit for serum alcohol is 10 mg/dL.  For medical purposes only. Performed at [facility], [place] [address] [place], [place] [address]   CBG monitoring, ED     Status: Abnormal   Collection Time: 08/22/22  4:46 PM  Result Value Ref Range   Glucose-Capillary  169 (H) 70 - 99 mg/dL    Comment: Glucose reference range applies only to samples taken after fasting for at least 8 hours.  I-Stat beta hCG blood, ED     Status: None   Collection Time: 08/22/22  4:50 PM  Result Value Ref Range   I-stat hCG, quantitative <5.0 <5 mIU/mL   Comment 3            Comment:   GEST. AGE      CONC.  (mIU/mL)   <=1 WEEK        5 - 50     2 WEEKS       50 - 500     3 WEEKS       100 - 10,000     4 WEEKS     1,000 - 30,000        FEMALE AND NON-PREGNANT FEMALE:     LESS THAN 5 mIU/mL   I-stat chem 8, ED     Status: Abnormal   Collection Time: 08/22/22  4:52 PM  Result Value Ref Range   Sodium 142 135 - 145 mmol/L   Potassium 4.1 3.5 - 5.1 mmol/L   Chloride 110 98 - 111 mmol/L   BUN 36 (H) 6 - 20 mg/dL   Creatinine, Ser 2.90 (H) 0.44 - 1.00 mg/dL   Glucose, Bld 175 (H) 70 - 99 mg/dL    Comment: Glucose reference range applies only to samples taken after fasting for at least 8 hours.   Calcium, Ion 1.26 1.15 - 1.40  mmol/L   TCO2 23 22 - 32 mmol/L   Hemoglobin 9.9 (L) 12.0 - 15.0 g/dL   HCT 29.0 (L) 36.0 - 46.0 %   MR BRAIN WO CONTRAST  Result Date: 08/22/2022 CLINICAL DATA:  Acute neurologic deficit EXAM: MRI HEAD WITHOUT CONTRAST TECHNIQUE: Multiplanar, multiecho pulse sequences of the brain and surrounding structures were obtained without intravenous contrast. COMPARISON:  06/21/2022 FINDINGS: Brain: Punctate focus of acute/early subacute ischemia at the lateral margin of the left basal ganglia. Multiple chronic microhemorrhages in a predominantly central distribution. There is multifocal hyperintense T2-weighted signal within the white matter. Parenchymal volume and CSF spaces are normal. 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 The midline structures are normal. Vascular: Normal flow voids. Skull and upper cervical spine: Normal marrow signal. Sinuses/Orbits: Bilateral mastoid effusions. Normal orbits and paranasal sinuses. Other: None IMPRESSION: 1. Punctate focus of acute/early subacute ischemia at the lateral margin of the left basal ganglia. No acute hemorrhage or mass effect. 2. Multiple chronic microhemorrhages in a predominantly central distribution, consistent with chronic hypertensive angiopathy. 3. 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 Electronically Signed   By: Ulyses Jarred M.D.   On: 08/22/2022 19:39   CT ANGIO HEAD NECK W WO CM (CODE STROKE)  Result Date: 08/22/2022 CLINICAL DATA:  Stroke suspected EXAM: CT ANGIOGRAPHY HEAD AND NECK TECHNIQUE: Multidetector CT imaging of the head and neck was performed using the standard protocol during bolus administration of intravenous contrast. Multiplanar CT image reconstructions and MIPs were obtained to evaluate the vascular anatomy. Carotid stenosis measurements (when applicable) are obtained utilizing NASCET criteria, using the distal internal carotid diameter as the denominator. RADIATION DOSE REDUCTION: This exam was performed according to  the departmental dose-optimization program which includes automated exposure control, adjustment of the mA and/or kV according to patient size and/or use of iterative reconstruction technique. CONTRAST:  11m OMNIPAQUE IOHEXOL 350 MG/ML SOLN COMPARISON:  Same day CT head, MRI Head/MRA head  03/22/23 FINDINGS: CT HEAD FINDINGS See same day CT brain for intracranial findings. No contrast enhancing lesion is visualized. CTA NECK FINDINGS Aortic arch: Standard branching. Imaged portion shows no evidence of aneurysm or dissection. No significant stenosis of the major arch vessel origins. Right carotid system: No evidence of dissection, stenosis (50% or greater), or occlusion. Left carotid system: No evidence of dissection, stenosis (50% or greater), or occlusion. Vertebral arteries: Left dominant. No evidence of dissection, stenosis (50% or greater), or occlusion. Skeleton: Status post C5-C6 ACDF Other neck: Negative. Upper chest: Negative. Review of the MIP images confirms the above findings CTA HEAD FINDINGS Anterior circulation: No significant stenosis, proximal occlusion, aneurysm, or vascular malformation. Posterior circulation: No significant stenosis, proximal occlusion, aneurysm, or vascular malformation. Venous sinuses: As permitted by contrast timing, patent. Anatomic variants: None Review of the MIP images confirms the above findings IMPRESSION: 1. No intracranial large vessel occlusion or significant stenosis. 2. No hemodynamically significant stenosis in the neck. Electronically Signed   By: Marin Roberts M.D.   On: 08/22/2022 17:23   CT HEAD CODE STROKE WO CONTRAST  Result Date: 08/22/2022 CLINICAL DATA:  Code stroke.  Neuro deficit, acute, stroke suspected EXAM: CT HEAD WITHOUT CONTRAST TECHNIQUE: Contiguous axial images were obtained from the base of the skull through the vertex without intravenous contrast. RADIATION DOSE REDUCTION: This exam was performed according to the departmental  dose-optimization program which includes automated exposure control, adjustment of the mA and/or kV according to patient size and/or use of iterative reconstruction technique. COMPARISON:  None Available. FINDINGS: Brain: Remote bilateral thalamic lacunar infarcts. Remote probable perforator infarct in the right internal capsule and bilateral corona radiata. No evidence of acute large vascular territory infarct. Additional patchy white matter hypodensities, nonspecific but compatible with chronic microvascular ischemic disease. No evidence of acute hemorrhage, mass lesion midline shift or hydrocephalus. Vascular: No hyperdense vessel identified. Skull: No acute fracture. Sinuses/Orbits: Clear sinuses.  No acute orbital findings. Other: Small left mastoid effusion. ASPECTS [facility] Stroke Program Early CT Score) total score (0-10 with 10 being normal): 10. IMPRESSION: 1. No evidence of acute large vascular territory infarct or acute hemorrhage. ASPECTS is 10. 2. Multiple remote lacunar infarcts and chronic microvascular ischemic disease. Code stroke imaging results were communicated on 08/22/2022 at 4:59 pm to provider Dr. Lorrin Goodell via secure text paging. Electronically Signed   By: Margaretha Sheffield M.D.   On: 08/22/2022 16:59    Pending Labs Unresulted Labs (From admission, onward)     Start     Ordered   08/23/22 0500  Lipid panel  (Labs)  Tomorrow morning,   R       Comments: Fasting    08/22/22 1949            Vitals/Pain Today's Vitals   08/22/22 2100 08/22/22 2101 08/22/22 2130 08/22/22 2301  BP: (!) 186/91  (!) 184/93 (!) 173/86  Pulse: 92  92 92  Resp: '15  10 17  '$ Temp:  98.2 F (36.8 C)  98.1 F (36.7 C)  TempSrc:  Oral  Oral  SpO2: 100%  100% 100%  Weight:      PainSc:    5     Isolation Precautions No active isolations  Medications Medications  ondansetron (ZOFRAN) injection 4 mg (has no administration in time range)   stroke: early stages of recovery book (has no  administration in time range)  acetaminophen (TYLENOL) tablet 650 mg (has no administration in time range)    Or  acetaminophen (  TYLENOL) 160 MG/5ML solution 650 mg (has no administration in time range)    Or  acetaminophen (TYLENOL) suppository 650 mg (has no administration in time range)  meclizine (ANTIVERT) tablet 25 mg (has no administration in time range)  aspirin EC tablet 81 mg (0 mg Oral Hold 08/22/22 2122)  atorvastatin (LIPITOR) tablet 80 mg (0 mg Oral Hold 08/22/22 2123)  ticagrelor (BRILINTA) tablet 90 mg (0 mg Oral Hold 08/22/22 2123)  iohexol (OMNIPAQUE) 350 MG/ML injection 75 mL (75 mLs Intravenous Contrast Given 08/22/22 1709)  prochlorperazine (COMPAZINE) injection 10 mg (10 mg Intravenous Given 08/22/22 2004)  diphenhydrAMINE (BENADRYL) injection 12.5 mg (12.5 mg Intravenous Given 08/22/22 2004)    Mobility walks with person assist     Focused Assessments Neuro Assessment Handoff:  Swallow screen pass? No    NIH Stroke Scale  Dizziness Present: Yes Headache Present: No Interval: Other (Comment) (2 hour) Level of Consciousness (1a.)   : Alert, keenly responsive LOC Questions (1b. )   : Answers both questions correctly LOC Commands (1c. )   : Performs both tasks correctly Best Gaze (2. )  : Normal Visual (3. )  : No visual loss Facial Palsy (4. )    : Normal symmetrical movements Motor Arm, Left (5a. )   : No drift Motor Arm, Right (5b. ) : No drift Motor Leg, Left (6a. )  : No drift Motor Leg, Right (6b. ) : No drift Limb Ataxia (7. ): Present in one limb Sensory (8. )  : Mild-to-moderate sensory loss, patient feels pinprick is less sharp or is dull on the affected side, or there is a loss of superficial pain with pinprick, but patient is aware of being touched Best Language (9. )  : No aphasia Dysarthria (10. ): Normal Extinction/Inattention (11.)   : No Abnormality Complete NIHSS TOTAL: 2 Last date known well: 08/22/22 Last time known well: 1230 Neuro  Assessment: Exceptions to WDL (Pt with  dizziness) Neuro Checks:   Initial (08/22/22 1700)  Has TPA been given? No If patient is a Neuro Trauma and patient is going to OR before floor call report to [facility] nurse: [phone] or [phone]   R Recommendations: See Admitting Provider Note  Report given to:   Additional Notes: pt did not pass swallow screen, rt eye situation is baseline, speech consult in

## 2022-08-24 DIAGNOSIS — R270 Ataxia, unspecified: Secondary | ICD-10-CM

## 2022-08-24 DIAGNOSIS — I639 Cerebral infarction, unspecified: Secondary | ICD-10-CM | POA: Diagnosis not present

## 2022-08-24 DIAGNOSIS — R531 Weakness: Secondary | ICD-10-CM | POA: Diagnosis not present

## 2022-08-24 MED ORDER — METOCLOPRAMIDE HCL 5 MG/ML IJ SOLN
10.0000 mg | INTRAMUSCULAR | Status: AC
  Administered 2022-08-24: 10 mg via INTRAVENOUS
  Filled 2022-08-24: qty 2

## 2022-08-24 MED ORDER — DIAZEPAM 5 MG PO TABS
5.0000 mg | ORAL_TABLET | ORAL | Status: DC | PRN
  Administered 2022-08-24 – 2022-08-25 (×3): 5 mg via ORAL
  Filled 2022-08-24 (×3): qty 1

## 2022-08-24 MED ORDER — DIAZEPAM 5 MG/ML IJ SOLN
5.0000 mg | INTRAMUSCULAR | Status: AC
  Administered 2022-08-24: 5 mg via INTRAVENOUS
  Filled 2022-08-24: qty 2

## 2022-08-24 NOTE — Consult Note (Signed)
Physical Medicine and Rehabilitation Consult Reason for Consult:left sided weakness Referring Physician: [place] Pink   HPI: Christina Orozco is a 52 y.o. female with a history of stage IV kidney disease, peripheral vascular disease and diabetes as well as previous hospitalizations for stroke, TIA, and seizures.  She has had multiple episodes of TIAs and strokes over the last 10 years with extensive workup.  She was on Brilinta and aspirin prior to admission.  She presented to [facility] emergency department on 08/22/2022 with complaints of dizziness.  While in the emergency room she developed left-sided weakness as well.  Workup included an MRI which noted punctate areas of acute/early subacute ischemia of the lateral margin of the left basal ganglia.  Neurology recommends aspirin and Brilinta for 4 weeks followed by aspirin alone as her risk factors seem adequately controlled.  She is being considered for the sleep smart study given she is at risk for sleep apnea.  With physical therapy yesterday she is min to mod assist for sit to stand transfers, +2.  Therapy notes ataxic like movements and increased unsteadiness and standing requiring assistance to stay upright.  Gait has not been tested yet.  Patient lives at home with her spouse and uses a cane for mobility prior to admission and was independent with her ADLs.   Review of Systems  Constitutional:  Negative for fever.  HENT:  Negative for hearing loss.   Eyes:  Positive for blurred vision and double vision.  Respiratory:  Negative for cough.   Cardiovascular:  Negative for chest pain.  Gastrointestinal:  Positive for nausea and vomiting.  Genitourinary:  Negative for dysuria.  Musculoskeletal:  Negative for myalgias.  Skin:  Negative for rash.  Neurological:  Positive for dizziness, focal weakness, weakness and headaches.  Psychiatric/Behavioral:  Negative for suicidal ideas.    Past Medical History:  Diagnosis Date    Anemia    severe   Anxiety    Asthma    CKD (chronic kidney disease)    stage IV   DDD (degenerative disc disease), cervical    Depression    Diabetes mellitus (HCC)    Dislocated shoulder    right   GERD (gastroesophageal reflux disease)    Headache    migraines (about once a month)   History of kidney stones    Hypertension    Neuropathy    Peripheral vascular disease ([place])    Pneumonia    Stroke [facility])    Past Surgical History:  Procedure Laterality Date   ADENOIDECTOMY     APPENDECTOMY     CERVICAL SPINE SURGERY  06/2012   C5-C6   EYE SURGERY     left eye surgery    FOOT SURGERY     KENALOG INJECTION Left 03/10/2021   Procedure: Intravitreal Avastin Injection;  Surgeon: Sherlynn Stalls, MD;  Location: [place];  Service: Ophthalmology;  Laterality: Left;   LOOP RECORDER INSERTION N/A 08/27/2017   Procedure: LOOP RECORDER INSERTION;  Surgeon: Sanda Klein, MD;  Location: [place] [facility];  Service: Cardiovascular;  Laterality: N/A;   MEMBRANE PEEL Left 03/10/2021   Procedure: MEMBRANE PEEL;  Surgeon: Sherlynn Stalls, MD;  Location: [place];  Service: Ophthalmology;  Laterality: Left;   PARS PLANA VITRECTOMY Left 03/10/2021   Procedure: TWENTY-FIVE GAUGE PARS PLANA VITRECTOMY;  Surgeon: Sherlynn Stalls, MD;  Location: [place];  Service: Ophthalmology;  Laterality: Left;   PHOTOCOAGULATION WITH LASER Left 03/10/2021   Procedure: PHOTOCOAGULATION WITH LASER;  Surgeon: Sherlynn Stalls, MD;  Location: [place];  Service: Ophthalmology;  Laterality: Left;   SHOULDER SURGERY     TEE WITHOUT CARDIOVERSION N/A 08/27/2017   Procedure: TRANSESOPHAGEAL ECHOCARDIOGRAM (TEE);  Surgeon: Sanda Klein, MD;  Location: [facility] ENDOSCOPY;  Service: Cardiovascular;  Laterality: N/A;   TONSILLECTOMY     Family History  Problem Relation Age of Onset   Vascular Disease Mother    CAD Mother    Heart failure Mother    Heart disease Other    Cancer Other        colon, stomach, pancreatic, lung   Diabetes  Other    Breast cancer Sister 30   Seizures Maternal Uncle    Social History:  reports that she has never smoked. She has never used smokeless tobacco. She reports that she does not drink alcohol and does not use drugs. Allergies:  Allergies  Allergen Reactions   Lisinopril Swelling and Other (See Comments)    Facial swelling  angioedema   Penicillins Hives    Has patient had a PCN reaction causing immediate rash, facial/tongue/throat swelling, SOB or lightheadedness with hypotension: yes Has patient had a PCN reaction causing severe rash involving mucus membranes or skin necrosis: No Has patient had a PCN reaction that required hospitalization No Has patient had a PCN reaction occurring within the last 10 years: No If all of the above answers are "NO", then may proceed wit Rash on buttock that makes sitting very difficult   Quetiapine Other (See Comments)    Unknown    Gabapentin Swelling and Other (See Comments)    "I thought I was having another stroke." dizziness   Medications Prior to Admission  Medication Sig Dispense Refill   albuterol (PROAIR HFA) 108 (90 Base) MCG/ACT inhaler INHALE 1 OR 2 PUFFS INTO THE LUNGS EVERY 6 HOURS AS NEEDED FOR WHEEZING OR SHORTNESS OF BREATH (Patient taking differently: Inhale 2 puffs into the lungs as needed for shortness of breath or wheezing.) 8.5 g 0   amLODipine (NORVASC) 5 MG tablet TAKE 1 TABLET (5 MG TOTAL) BY MOUTH DAILY. (AM) (Patient taking differently: Take 5 mg by mouth daily.) 90 tablet 1   aspirin EC 81 MG tablet Take 1 tablet (81 mg total) by mouth daily. 100 tablet 5   atorvastatin (LIPITOR) 80 MG tablet Take 80 mg by mouth at bedtime.     carvedilol (COREG) 12.5 MG tablet Take 1 tablet (12.5 mg total) by mouth 2 (two) times daily with a meal. 180 tablet 2   DULoxetine (CYMBALTA) 60 MG capsule Take 60 mg by mouth at bedtime.     montelukast (SINGULAIR) 10 MG tablet Take 10 mg by mouth at bedtime.     OZEMPIC, 0.25 OR 0.5 MG/DOSE, 2  MG/3ML SOPN Inject 0.5 mg into the skin See admin instructions. Inject 0.5 mg subcutaneous every 10 days     pantoprazole (PROTONIX) 40 MG tablet Take 1 tablet (40 mg total) by mouth daily. 30 tablet 0   ticagrelor (BRILINTA) 90 MG TABS tablet Take 90 mg by mouth in the morning and at bedtime.     acetaminophen (TYLENOL) 325 MG tablet Take 1-2 tablets (325-650 mg total) by mouth every 4 (four) hours as needed for mild pain. (Patient not taking: Reported on 08/24/2022)     azithromycin (ZITHROMAX) 250 MG tablet Take 1 tablet (250 mg total) by mouth daily. Take first 2 tablets together, then 1 every day until finished. (Patient not taking: Reported on 06/21/2022) 6 tablet  0   glucose blood (CONTOUR NEXT TEST) test strip 1 each by Other route 2 (two) times daily. And lancets 2/day (Patient taking differently: 1 each by Other route 2 (two) times daily.) 100 each 12   traZODone (DESYREL) 50 MG tablet Take 1 tablet (50 mg total) by mouth at bedtime as needed for sleep. (Patient not taking: Reported on 06/21/2022) 30 tablet 0    Home: Home Living Family/patient expects to be discharged to:: Private residence Living Arrangements: Spouse/significant other Available Help at Discharge: Family, Available PRN/intermittently Type of Home: Apartment Home Access: Level entry Home Layout: One level Bathroom Shower/Tub: Tub/shower unit, Architectural technologist: Handicapped height Bathroom Accessibility: Yes Home Equipment: Conservation officer, nature (2 wheels), [employer] - single point, Civil engineer, contracting, Grab bars - tub/shower, Hand held shower head, Grab bars - toilet  Lives With: Daughter  Functional History: Prior Function Prior Level of Function : Needs assist Mobility Comments: Uses cane, limited community ambulator. received HHOT and HHRN ADLs Comments: able to perform ADLs and light IADLs Functional Status:  Mobility: Bed Mobility Overal bed mobility: Needs Assistance Bed Mobility: Supine to Sit, Sit to Supine Supine to  sit: Supervision Sit to supine: Min assist General bed mobility comments: HOB elevated, use of gaze stabilization during transition, no physical assist. MinA for LE management back into bed Transfers Overall transfer level: Needs assistance Equipment used: 2 person hand held assist Transfers: Sit to/from Stand Sit to Stand: Min assist, Mod assist, +2 physical assistance General transfer comment: initially min A +2, pt with ataxic-like movements and increased unsteadiness in standing requiring mod A +2 to remain upright, increased dizziness with lateral weight shifting      ADL: ADL Overall ADL's : Needs assistance/impaired Eating/Feeding: Set up, Sitting Grooming: Set up, Sitting Upper Body Bathing: Sitting, Moderate assistance Lower Body Bathing: Sitting/lateral leans, Maximal assistance Upper Body Dressing : Moderate assistance, Sitting Lower Body Dressing: Maximal assistance, Sitting/lateral leans Toilet Transfer: Moderate assistance, +2 for safety/equipment, +2 for physical assistance Toileting- Clothing Manipulation and Hygiene: Moderate assistance Functional mobility during ADLs: Moderate assistance, +2 for physical assistance, Minimal assistance  Cognition: Cognition Overall Cognitive Status: Impaired/Different from baseline Arousal/Alertness: Awake/alert Orientation Level: Oriented X4 Year: 2024 Day of Week: Correct Attention: Sustained Sustained Attention: Appears intact Memory: Impaired Memory Impairment: Storage deficit, Retrieval deficit (3/5 word recall) Awareness: Appears intact Problem Solving: Appears intact Safety/Judgment: Appears intact Cognition Arousal/Alertness: Awake/alert Behavior During Therapy: WFL for tasks assessed/performed Overall Cognitive Status: Impaired/Different from baseline General Comments: WFL for basic mobility  Blood pressure (!) 173/79, pulse 79, temperature 98.4 F (36.9 C), temperature source Oral, resp. rate 16, height '5\' 2"'$   (1.575 m), weight 64.4 kg, last menstrual period 05/22/2018, SpO2 100 %. Physical Exam Constitutional:      General: She is not in acute distress.    Appearance: She is obese.  HENT:     Head: Normocephalic and atraumatic.     Nose: Nose normal.     Mouth/Throat:     Mouth: Mucous membranes are moist.  Eyes:     Pupils: Pupils are equal, round, and reactive to light.  Cardiovascular:     Rate and Rhythm: Normal rate.  Pulmonary:     Effort: Pulmonary effort is normal.  Abdominal:     Palpations: Abdomen is soft.  Musculoskeletal:        General: Normal range of motion.     Cervical back: Normal range of motion.  Skin:    Comments: A few tattoos  Neurological:  Comments: Alert and oriented x 3. Fairl insight and awareness. [place] Memory. Normal language. Measured speech, low volume, sl dysarthria. Delayed processing. . Cranial nerve exam unremarkable. 4-5 beats nystagmus with lateral gaze to left and right. RUE 5/5. LUE 4-/5 prox to 4+/5 distally. RLE 4- prox to 4+/5 distally. LLE 4-/5 prox to distal. Senses pain and light touch in all 4's. Decreased FMC LUE and LLE with ataxia/past pointing. DTR's 1+ right 2+ left. No abnl resting tone.   Psychiatric:     Comments: Pt flat but cooperative     No results found for this or any previous visit (from the past 24 hour(s)). ECHOCARDIOGRAM COMPLETE  Result Date: 08/23/2022    ECHOCARDIOGRAM REPORT   Patient Name:   Sadiemae Bartles Date of Exam: 08/23/2022 Medical Rec #:  [id number]        Height:       62.0 in Accession #:    [id number]       Weight:       142.0 lb Date of Birth:  Jun 23, 1970       BSA:          1.653 m Patient Age:    38 years         BP:           166/75 mmHg Patient Gender: F                HR:           87 bpm. Exam Location:  Inpatient Procedure: 2D Echo Indications:    TIA  History:        Patient has prior history of Echocardiogram examinations, most                 recent 06/21/2022. Stroke; Risk Factors:Diabetes,  Hypertension                 and Dyslipidemia.  Sonographer:    Harvie Junior Referring Phys: [place] T TU IMPRESSIONS  1. Left ventricular ejection fraction, by estimation, is 65 to 70%. Left ventricular ejection fraction by 3D volume is 68 %. The left ventricle has normal function. The left ventricle has no regional wall motion abnormalities. There is moderate asymmetric left ventricular hypertrophy of the basal-septal segment (14 mm). Hypertrophied papillary muscles. Indeterminate diastolic filling due to E-A fusion.  2. Right ventricular systolic function is normal. The right ventricular size is normal.  3. A small pericardial effusion is present. The pericardial effusion is anterior to the right ventricle.  4. The mitral valve is normal in structure. Trivial mitral valve regurgitation. No evidence of mitral stenosis.  5. The aortic valve is tricuspid. Aortic valve regurgitation is not visualized. No aortic stenosis is present. Conclusion(s)/Recommendation(s): No intracardiac source of embolism detected on this transthoracic study. Consider a transesophageal echocardiogram to exclude cardiac source of embolism if clinically indicated. FINDINGS  Left Ventricle: Left ventricular ejection fraction, by estimation, is 65 to 70%. Left ventricular ejection fraction by 3D volume is 68 %. The left ventricle has normal function. The left ventricle has no regional wall motion abnormalities. The left ventricular internal cavity size was normal in size. There is moderate asymmetric left ventricular hypertrophy of the basal-septal segment. Indeterminate diastolic filling due to E-A fusion. Right Ventricle: The right ventricular size is normal. No increase in right ventricular wall thickness. Right ventricular systolic function is normal. Left Atrium: Left atrial size was normal in size. Right Atrium: Right atrial size was normal in size. Pericardium: A  small pericardial effusion is present. The pericardial effusion is anterior  to the right ventricle. Mitral Valve: Chordal SAM. The mitral valve is normal in structure. Trivial mitral valve regurgitation. No evidence of mitral valve stenosis. Tricuspid Valve: The tricuspid valve is normal in structure. Tricuspid valve regurgitation is trivial. No evidence of tricuspid stenosis. Aortic Valve: The aortic valve is tricuspid. Aortic valve regurgitation is not visualized. No aortic stenosis is present. Aortic valve mean gradient measures 3.0 mmHg. Aortic valve peak gradient measures 5.3 mmHg. Aortic valve area, by VTI measures 2.26 cm. Pulmonic Valve: The pulmonic valve was normal in structure. Pulmonic valve regurgitation is trivial. No evidence of pulmonic stenosis. Aorta: The aortic root is normal in size and structure. Venous: The inferior vena cava was not well visualized. IAS/Shunts: The interatrial septum was not well visualized.  LEFT VENTRICLE PLAX 2D LVIDd:         3.80 cm         Diastology LVIDs:         2.20 cm         LV e' medial:    5.98 cm/s LV PW:         1.10 cm         LV E/e' medial:  15.0 LV IVS:        1.00 cm         LV e' lateral:   6.42 cm/s LVOT diam:     2.00 cm         LV E/e' lateral: 14.0 LV SV:         51 LV SV Index:   31 LVOT Area:     3.14 cm        3D Volume EF                                LV 3D EF:    Left                                             ventricul LV Volumes (MOD)                            ar LV vol d, MOD    76.0 ml                    ejection A2C:                                        fraction LV vol d, MOD    110.0 ml                   by 3D A4C:                                        volume is LV vol s, MOD    27.0 ml                    68 %. A2C: LV vol s, MOD    48.1 ml A4C:  3D Volume EF: LV SV MOD A2C:   49.0 ml       3D EF:        68 % LV SV MOD A4C:   110.0 ml      LV EDV:       102 ml LV SV MOD BP:    52.8 ml       LV ESV:       32 ml                                LV SV:        70 ml RIGHT VENTRICLE RV  Basal diam:  2.40 cm RV Mid diam:    1.90 cm RV S prime:     16.50 cm/s TAPSE (M-mode): 2.2 cm LEFT ATRIUM             Index        RIGHT ATRIUM           Index LA diam:        3.20 cm 1.94 cm/m   RA Area:     10.90 cm LA Vol (A2C):   21.4 ml 12.95 ml/m  RA Volume:   23.10 ml  13.98 ml/m LA Vol (A4C):   27.8 ml 16.82 ml/m LA Biplane Vol: 26.7 ml 16.16 ml/m  AORTIC VALVE                    PULMONIC VALVE AV Area (Vmax):    2.36 cm     PV Vmax:       0.93 m/s AV Area (Vmean):   2.40 cm     PV Peak grad:  3.5 mmHg AV Area (VTI):     2.26 cm AV Vmax:           115.50 cm/s AV Vmean:          77.800 cm/s AV VTI:            0.224 m AV Peak Grad:      5.3 mmHg AV Mean Grad:      3.0 mmHg LVOT Vmax:         86.77 cm/s LVOT Vmean:        59.333 cm/s LVOT VTI:          0.161 m LVOT/AV VTI ratio: 0.72  AORTA Ao Root diam: 3.10 cm MITRAL VALVE                TRICUSPID VALVE MV Area (PHT): 6.32 cm     TR Peak grad:   19.0 mmHg MV Decel Time: 120 msec     TR Vmax:        218.00 cm/s MR Peak grad: 25.4 mmHg MR Vmax:      252.00 cm/s   SHUNTS MV E velocity: 89.70 cm/s   Systemic VTI:  0.16 m MV A velocity: 105.00 cm/s  Systemic Diam: 2.00 cm MV E/A ratio:  0.85 Cherlynn Kaiser MD Electronically signed by Cherlynn Kaiser MD Signature Date/Time: 08/23/2022/3:55:54 PM    Final    MR BRAIN WO CONTRAST  Result Date: 08/22/2022 CLINICAL DATA:  Acute neurologic deficit EXAM: MRI HEAD WITHOUT CONTRAST TECHNIQUE: Multiplanar, multiecho pulse sequences of the brain and surrounding structures were obtained without intravenous contrast. COMPARISON:  06/21/2022 FINDINGS: Brain: Punctate focus of acute/early subacute ischemia at the lateral margin of the left basal ganglia. Multiple chronic microhemorrhages in  a predominantly central distribution. There is multifocal hyperintense T2-weighted signal within the white matter. Parenchymal volume and CSF spaces are normal. 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 The midline  structures are normal. Vascular: Normal flow voids. Skull and upper cervical spine: Normal marrow signal. Sinuses/Orbits: Bilateral mastoid effusions. Normal orbits and paranasal sinuses. Other: None IMPRESSION: 1. Punctate focus of acute/early subacute ischemia at the lateral margin of the left basal ganglia. No acute hemorrhage or mass effect. 2. Multiple chronic microhemorrhages in a predominantly central distribution, consistent with chronic hypertensive angiopathy. 3. 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 Electronically Signed   By: Ulyses Jarred M.D.   On: 08/22/2022 19:39   CT ANGIO HEAD NECK W WO CM (CODE STROKE)  Result Date: 08/22/2022 CLINICAL DATA:  Stroke suspected EXAM: CT ANGIOGRAPHY HEAD AND NECK TECHNIQUE: Multidetector CT imaging of the head and neck was performed using the standard protocol during bolus administration of intravenous contrast. Multiplanar CT image reconstructions and MIPs were obtained to evaluate the vascular anatomy. Carotid stenosis measurements (when applicable) are obtained utilizing NASCET criteria, using the distal internal carotid diameter as the denominator. RADIATION DOSE REDUCTION: This exam was performed according to the departmental dose-optimization program which includes automated exposure control, adjustment of the mA and/or kV according to patient size and/or use of iterative reconstruction technique. CONTRAST:  82m OMNIPAQUE IOHEXOL 350 MG/ML SOLN COMPARISON:  Same day CT head, MRI Head/MRA head 03/22/23 FINDINGS: CT HEAD FINDINGS See same day CT brain for intracranial findings. No contrast enhancing lesion is visualized. CTA NECK FINDINGS Aortic arch: Standard branching. Imaged portion shows no evidence of aneurysm or dissection. No significant stenosis of the major arch vessel origins. Right carotid system: No evidence of dissection, stenosis (50% or greater), or occlusion. Left carotid system: No evidence of dissection, stenosis (50% or greater),  or occlusion. Vertebral arteries: Left dominant. No evidence of dissection, stenosis (50% or greater), or occlusion. Skeleton: Status post C5-C6 ACDF Other neck: Negative. Upper chest: Negative. Review of the MIP images confirms the above findings CTA HEAD FINDINGS Anterior circulation: No significant stenosis, proximal occlusion, aneurysm, or vascular malformation. Posterior circulation: No significant stenosis, proximal occlusion, aneurysm, or vascular malformation. Venous sinuses: As permitted by contrast timing, patent. Anatomic variants: None Review of the MIP images confirms the above findings IMPRESSION: 1. No intracranial large vessel occlusion or significant stenosis. 2. No hemodynamically significant stenosis in the neck. Electronically Signed   By: HMarin RobertsM.D.   On: 08/22/2022 17:23   CT HEAD CODE STROKE WO CONTRAST  Result Date: 08/22/2022 CLINICAL DATA:  Code stroke.  Neuro deficit, acute, stroke suspected EXAM: CT HEAD WITHOUT CONTRAST TECHNIQUE: Contiguous axial images were obtained from the base of the skull through the vertex without intravenous contrast. RADIATION DOSE REDUCTION: This exam was performed according to the departmental dose-optimization program which includes automated exposure control, adjustment of the mA and/or kV according to patient size and/or use of iterative reconstruction technique. COMPARISON:  None Available. FINDINGS: Brain: Remote bilateral thalamic lacunar infarcts. Remote probable perforator infarct in the right internal capsule and bilateral corona radiata. No evidence of acute large vascular territory infarct. Additional patchy white matter hypodensities, nonspecific but compatible with chronic microvascular ischemic disease. No evidence of acute hemorrhage, mass lesion midline shift or hydrocephalus. Vascular: No hyperdense vessel identified. Skull: No acute fracture. Sinuses/Orbits: Clear sinuses.  No acute orbital findings. Other: Small left mastoid  effusion. ASPECTS ([facility] Early CT Score) total score (0-10 with 10 being normal): 10.  IMPRESSION: 1. No evidence of acute large vascular territory infarct or acute hemorrhage. ASPECTS is 10. 2. Multiple remote lacunar infarcts and chronic microvascular ischemic disease. Code stroke imaging results were communicated on 08/22/2022 at 4:59 pm to provider Dr. Lorrin Goodell via secure text paging. Electronically Signed   By: Margaretha Sheffield M.D.   On: 08/22/2022 16:59    Assessment/Plan: Diagnosis: Right hemispheric TIA/CVA with silent left basal ganglia infarct.She has a history of numerous prior TIAs and strokes.  Patient with persistent left-sided weakness as well as ataxia with substantial balance impairment and cognitive deficits. Does the need for close, 24 hr/day medical supervision in concert with the patient's rehab needs make it unreasonable for this patient to be served in a less intensive setting? Yes Co-Morbidities requiring supervision/potential complications:  -Hypertension -Diabetes type 2 -Depression Due to bladder management, bowel management, safety, skin/wound care, disease management, medication administration, pain management, and patient education, does the patient require 24 hr/day rehab nursing? Yes Does the patient require coordinated care of a physician, rehab nurse, therapy disciplines of PT, OT, SLP to address physical and functional deficits in the context of the above medical diagnosis(es)? Yes Addressing deficits in the following areas: balance, endurance, locomotion, strength, transferring, bowel/bladder control, bathing, dressing, feeding, grooming, toileting, cognition, speech, and psychosocial support Can the patient actively participate in an intensive therapy program of at least 3 hrs of therapy per day at least 5 days per week? Yes The potential for patient to make measurable gains while on inpatient rehab is excellent Anticipated functional outcomes  upon discharge from inpatient rehab are supervision  with PT, supervision and min assist with OT, modified independent and supervision with SLP. Estimated rehab length of stay to reach the above functional goals is: 12-18 days Anticipated discharge destination: Home Overall Rehab/Functional Prognosis: excellent  POST ACUTE RECOMMENDATIONS: This patient's condition is appropriate for continued rehabilitative care in the following setting: CIR Patient has agreed to participate in recommended program. Yes Note that insurance prior authorization may be required for reimbursement for recommended care.  Comment: Need to confirm who can help her at home. Has 2 daughters, one of which is in college. Pt is motivated to return home. Rehab Admissions Coordinator to follow up     I have personally performed a face to face diagnostic evaluation of this patient. Additionally, I have examined the patient's medical record including any pertinent labs and radiographic images. If the physician assistant has documented in this note, I have reviewed and edited or otherwise concur with the physician assistant's documentation.  Thanks,  Meredith Staggers, MD 08/24/2022

## 2022-08-24 NOTE — Progress Notes (Signed)
Physical Therapy Treatment Patient Details Name: Christina Orozco MRN: [id number] DOB: 1970/09/28 Today's Date: 08/24/2022   History of Present Illness Pt is a 52 y.o. F who presents with left sided weakness and slurred speech. CTH negative for acute abnormality. MRI with punctuate focus of acute/early subacute ischemia at the lateral margin of the left basal ganglia. PMH: hypertension, hyperlipidemia, diabetes, chronic kidney disease, prior strokes, history of seizures possibly secondary to PRES (2019) versus underlying epilepsy    PT Comments    Pt is demonstrating good, gradual progress towards her PT goals. While pt is still experiencing dizziness, especially when turning, she reports it is gradually improving and gaze fixation is apparently helping during mobility. She continues to display ataxia that places her at high risk for falls, needing cues for targets for her feet when stepping to improve her stance width. She also tends to lean to her L when ambulating, needing min-modA for balance throughout. Will continue to follow acutely. Current recommendations remain appropriate.     Recommendations for follow up therapy are one component of a multi-disciplinary discharge planning process, led by the attending physician.  Recommendations may be updated based on patient status, additional functional criteria and insurance authorization.  Follow Up Recommendations  Acute inpatient rehab (3hours/day)     Assistance Recommended at Discharge Frequent or constant Supervision/Assistance  Patient can return home with the following A lot of help with walking and/or transfers;A little help with bathing/dressing/bathroom;Assistance with cooking/housework;Assist for transportation;Help with stairs or ramp for entrance   Equipment Recommendations  Wheelchair (measurements PT);BSC/3in1;Wheelchair cushion (measurements PT)    Recommendations for Other Services       Precautions / Restrictions  Precautions Precautions: Fall Restrictions Weight Bearing Restrictions: No     Mobility  Bed Mobility Overal bed mobility: Needs Assistance Bed Mobility: Supine to Sit, Sit to Supine     Supine to sit: Supervision, HOB elevated Sit to supine: Supervision, HOB elevated   General bed mobility comments: HOB elevated, use of gaze stabilization during transition with "A" on paper taped to wall anterior to her, no physical assist, supervision for safety    Transfers Overall transfer level: Needs assistance Equipment used: Rolling walker (2 wheels), None Transfers: Sit to/from Stand Sit to Stand: Min assist, Min guard           General transfer comment: Pt transferring to stand from EOB initially without UE support, relying on her posterior legs resting against the bed surface for support and stability. MinA provided to maintain stability with transfer to stand without AD. Improved stability when pt came to stand to a RW,  needing cues for hand transitions, min guard assist for safety, x3 reps.    Ambulation/Gait Ambulation/Gait assistance: Min assist, Mod assist Gait Distance (Feet): 30 Feet (x3 bouts of ~7 ft > ~15 ft > ~30 ft) Assistive device: Rolling walker (2 wheels) Gait Pattern/deviations: Step-through pattern, Decreased step length - right, Decreased step length - left, Decreased stride length, Decreased weight shift to right, Ataxic, Narrow base of support, Trunk flexed Gait velocity: reduced Gait velocity interpretation: <1.31 ft/sec, indicative of household ambulator   General Gait Details: Pt ambulates slowly with an ataxic gait pattern, needing cues to aim her feet for either wheel on the anterior aspect of the RW to facilitate wider BOS and improved coordination when stepping. Pt also tends to lean to her L, needing verbal and tactile cues to lean to her R when in R stance phase. Min-modA for stability with intermittent LOB,  primarily when turning her head or her body  to change positions. Cues provided for gaze stabilization throughout.   Stairs             Wheelchair Mobility    Modified Rankin (Stroke Patients Only) Modified Rankin (Stroke Patients Only) Pre-Morbid Rankin Score: No significant disability Modified Rankin: Moderately severe disability     Balance Overall balance assessment: Needs assistance Sitting-balance support: Feet supported Sitting balance-Leahy Scale: Good Sitting balance - Comments: close supervision   Standing balance support: Bilateral upper extremity supported, During functional activity Standing balance-Leahy Scale: Poor Standing balance comment: reliant on bilateral UE support and external physical assistance                            Cognition Arousal/Alertness: Awake/alert Behavior During Therapy: WFL for tasks assessed/performed Overall Cognitive Status: Within Functional Limits for tasks assessed                                 General Comments: Flat affect, but understandable considering her symptoms and circumstance.        Exercises      General Comments General comments (skin integrity, edema, etc.): encouraged OOB mobility and sitting in recliner vs staying in bed, but pt declining and requesting to return to bed end of session; encouraged HOB elevated to improve pt tolerance to upright position; educated pt on exercises to improve her dizziness, like rotating her head or nodding her head while keeping eyes on the "A" on the paper on the wall in the room      Pertinent Vitals/Pain Pain Assessment Pain Assessment: Faces Faces Pain Scale: Hurts little more Pain Location: dizziness Pain Descriptors / Indicators: Discomfort Pain Intervention(s): Limited activity within patient's tolerance, Monitored during session, Repositioned, Other (comment) (gaze fixation techniques)    Home Living                          Prior Function            PT Goals  (current goals can now be found in the care plan section) Acute Rehab PT Goals Patient Stated Goal: for dizziness to improve PT Goal Formulation: With patient Time For Goal Achievement: 09/06/22 Potential to Achieve Goals: Good Progress towards PT goals: Progressing toward goals    Frequency    Min 4X/week      PT Plan Current plan remains appropriate    Co-evaluation              AM-PAC PT "6 Clicks" Mobility   Outcome Measure  Help needed turning from your back to your side while in a flat bed without using bedrails?: A Little Help needed moving from lying on your back to sitting on the side of a flat bed without using bedrails?: A Little Help needed moving to and from a bed to a chair (including a wheelchair)?: A Lot Help needed standing up from a chair using your arms (e.g., wheelchair or bedside chair)?: A Little Help needed to walk in hospital room?: A Lot Help needed climbing 3-5 steps with a railing? : Total 6 Click Score: 14    End of Session Equipment Utilized During Treatment: Gait belt Activity Tolerance: Other (comment);Patient tolerated treatment well (limited by dizziness) Patient left: in bed;with call bell/phone within reach;with bed alarm set Nurse Communication: Mobility status;Other (comment) (found  pill in bed, placed it near computer in room for RN) PT Visit Diagnosis: Unsteadiness on feet (R26.81);Ataxic gait (R26.0);Difficulty in walking, not elsewhere classified (R26.2);Dizziness and giddiness (R42);Other abnormalities of gait and mobility (R26.89);Other symptoms and signs involving the nervous system (R29.898)     Time: [id number] PT Time Calculation (min) (ACUTE ONLY): 21 min  Charges:  $Gait Training: 8-22 mins                     Moishe Spice, PT, DPT Acute Rehabilitation Services  Office: [place] 08/24/2022, 1:28 PM

## 2022-08-24 NOTE — Plan of Care (Signed)
  Problem: Education: Goal: Knowledge of disease or condition will improve Outcome: Progressing   Problem: Coping: Goal: Will identify appropriate support needs Outcome: Progressing   Problem: Health Behavior/Discharge Planning: Goal: Ability to manage health-related needs will improve Outcome: Progressing   Problem: Self-Care: Goal: Ability to communicate needs accurately will improve Outcome: Progressing   Problem: Nutrition: Goal: Risk of aspiration will decrease Outcome: Progressing Goal: Dietary intake will improve Outcome: Progressing

## 2022-08-24 NOTE — Progress Notes (Signed)
  Inpatient Rehabilitation Admissions Coordinator   Met with patient at bedside for rehab assessment. We discussed goals and expectations of a possible CIR admit. She was recently at [facility] 7/23 and sees Dr Aretta Nip in the PM&R clinic. She prefers CIR for rehab. 50 year old daughter goes to [facility] during the week. Her 49 year old daughter works at [employer] and can not provide 24/7 assist. I discussed with therapy and it is felt that she can reach Mod I wheelchair level with a CIR admit. Dr Naaman Plummer in agreement to pursue CIR admit. She currently does not have PCS services in the home and she would like to reestablish PCS for she left [facility] services in the past few months. I will begin insurance Auth with [facility] for possible CIR admit pending approval. Please call me with any questions.   Danne Civello, RN, MSN Rehab Admissions Coordinator [phone]

## 2022-08-24 NOTE — Evaluation (Signed)
Speech Language Pathology Evaluation Patient Details Name: Christina Orozco MRN: [id number] DOB: [birth year] Today's Date: 08/24/2022 Time: [id number] SLP Time Calculation (min) (ACUTE ONLY): 16 min  Problem List:  Patient Active Problem List   Diagnosis Date Noted   Acute CVA (cerebrovascular accident) ([place]) 08/23/2022   Overweight (BMI 25.0-29.9) 08/23/2022   Acute left-sided weakness 08/22/2022   Asthma, chronic 08/22/2022   TIA (transient ischemic attack) 06/20/2022   Numbness and tingling in left hand 03/02/2022   Gait disturbance, post-stroke 03/02/2022   AKI (acute kidney injury) ([place]) [id number]   Acute embolic stroke ([place]) [id number]   Hypernatremia 12/20/2021   Chronic kidney disease (CKD), stage IV (severe) ([place]) 12/19/2021   Left-sided weakness and visual deficits  12/19/2021   Vision abnormalities 12/19/2021   Stroke ([place]) 11/10/2021   Depression 07/05/2020   CVA (cerebral vascular accident) ([place]) 06/21/2020   CVA (cerebrovascular accident) ([place]) 06/19/2020   Generalized anxiety disorder 03/01/2019   Abnormality of gait 10/14/2018   Dizziness and giddiness 09/13/2018   Debility 06/26/2018   Chronic nonintractable headache 0000000   Diastolic dysfunction    Congestion of nasal sinus    Pneumonia of left lower lobe due to infectious organism    SOB (shortness of breath)    Candidiasis    Anemia    Hypoalbuminemia due to protein-calorie malnutrition ([place])    Hypertension    Type 2 diabetes mellitus with chronic kidney disease, without long-term current use of insulin ([place])    PRES (posterior reversible encephalopathy syndrome)    Hypokalemia    History of CVA with residual deficit    History of seizure 03/25/2018   Bilateral carpal tunnel syndrome 02/14/2018   Arthritis 10/31/2017   Asthma 10/31/2017   Constipation 10/31/2017   Kidney stone 10/31/2017   Anemia, chronic disease 08/09/2017   Hyperlipidemia 07/26/2017   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[place]) 07/25/2017    Anxiety as acute reaction to exceptional stress 04/23/2017   Insomnia 10/08/2016   Neuropathy 06/07/2016   Gait instability    Lesion of pons    Slurred speech    Vertigo 04/30/2016   Hyperglycemia 04/30/2016   Chronic kidney disease (CKD), stage III (moderate) ([place]) 04/30/2016   Ataxia 04/30/2016   Chronic right shoulder pain 04/03/2016   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   Rhinitis, allergic 08/25/2015   Asthma, mild intermittent 07/14/2015   Gastroesophageal reflux disease without esophagitis 06/08/2015   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)    Past Surgical History:  Past Surgical History:  Procedure Laterality Date   ADENOIDECTOMY     APPENDECTOMY     CERVICAL SPINE SURGERY  06/2012   C5-C6   EYE SURGERY     left eye surgery    FOOT SURGERY     KENALOG INJECTION Left 03/10/2021   Procedure: Intravitreal Avastin Injection;  Surgeon:  Sherlynn Stalls, MD;  Location: [place];  Service: Ophthalmology;  Laterality: Left;   LOOP RECORDER INSERTION N/A 08/27/2017   Procedure: LOOP RECORDER INSERTION;  Surgeon: Sanda Klein, MD;  Location: [place] [facility];  Service: Cardiovascular;  Laterality: N/A;   MEMBRANE PEEL Left 03/10/2021   Procedure: MEMBRANE PEEL;  Surgeon: Sherlynn Stalls, MD;  Location: [place];  Service: Ophthalmology;  Laterality: Left;   PARS PLANA VITRECTOMY Left 03/10/2021   Procedure: TWENTY-FIVE GAUGE PARS PLANA VITRECTOMY;   Surgeon: Sherlynn Stalls, MD;  Location: [place];  Service: Ophthalmology;  Laterality: Left;   PHOTOCOAGULATION WITH LASER Left 03/10/2021   Procedure: PHOTOCOAGULATION WITH LASER;  Surgeon: Sherlynn Stalls, MD;  Location: [place];  Service: Ophthalmology;  Laterality: Left;   SHOULDER SURGERY     TEE WITHOUT CARDIOVERSION N/A 08/27/2017   Procedure: TRANSESOPHAGEAL ECHOCARDIOGRAM (TEE);  Surgeon: Sanda Klein, MD;  Location: [facility];  Service: Cardiovascular;  Laterality: N/A;   TONSILLECTOMY     HPI:  Christina Orozco is a 52 y.o. female with medical history significant of CVA/TIA on aspirin and Brilinta, PRES secondary to HTN, seizure, T2DM, CKD 4, PVD, anxiety/depression who presents with left sided weakness and dizziness. MRI puntate focus of acute/early subacute ischemia at the lateral margin of left basal ganglis. No acute hemoorrhage or masse effect,, multiple chronic microhemorrhages in a predominately centarl distribution, old bilateral thalamic infarcts. CTA head and neck without LVO. MBS 05/2022 normal oropharyngeal ability without penetration or aspiration, Barium tablet halted at distal esophgaus without clearance. Pt underwent neck CT showed no tongue mass identified. Pt has undergone adenoidectomy and tonsillectomy. SLP recommended possible ENT consult.   Assessment / Plan / Recommendation Clinical Impression  Pt exhibits min-mild cognitive impairments mostly in memory, thought organization and higher level problem solving. She scored a 25/30 on SLUMS with with 3/5 word recall with deficits in both encoding and decoding. She feels her speech is normal but says daughter states it is a little sturred. She is intelligible in conversation as well as adequate language abilities. She was able to draw a clock without significant dizziness today. Overall deficits are mild and pt is agreeable to continued speech therapy. She prefers home health therapies. ST wil work with while here.    SLP  Assessment  SLP Recommendation/Assessment: Patient needs continued Speech [facility] SLP Visit Diagnosis: Cognitive communication deficit (R41.841)    Recommendations for follow up therapy are one component of a multi-disciplinary discharge planning process, led by the attending physician.  Recommendations may be updated based on patient status, additional functional criteria and insurance authorization.    Follow Up Recommendations  Acute inpatient rehab (3hours/day) (pt states she wants home health)    Assistance Recommended at Discharge  Intermittent Supervision/Assistance  Functional Status Assessment Patient has had a recent decline in their functional status and demonstrates the ability to make significant improvements in function in a reasonable and predictable amount of time.  Frequency and Duration min 2x/week  2 weeks      SLP Evaluation Cognition  Overall Cognitive Status: Impaired/Different from baseline Arousal/Alertness: Awake/alert Orientation Level: Oriented X4 Year: 2024 Day of Week: Correct Attention: Sustained Sustained Attention: Appears intact Memory: Impaired Memory Impairment: Storage deficit;Retrieval deficit (3/5 word recall) Awareness: Appears intact Problem Solving: Appears intact Safety/Judgment: Appears intact       Comprehension  Auditory Comprehension Overall Auditory Comprehension: Appears within functional limits for tasks assessed Visual Recognition/Discrimination Discrimination: Not tested Reading Comprehension Reading Status: Not tested  Expression Expression Primary Mode of Expression: Verbal Verbal Expression Overall Verbal Expression: Appears within functional limits for tasks assessed Pragmatics: No impairment Written Expression Dominant Hand: Right Written Expression: Not tested   Oral / Motor  Oral Motor/Sensory Function Overall Oral Motor/Sensory Function: Mild impairment Facial ROM: Reduced left Facial  Symmetry: Abnormal symmetry left Facial Strength: Reduced left Lingual ROM: Within Functional Limits Lingual Symmetry: Within Functional Limits Motor Speech Overall Motor Speech: Appears within functional limits for tasks assessed Respiration: Within functional limits Phonation: Normal Articulation: Within functional limitis Intelligibility: Intelligible Motor Planning: Witnin functional limits            Houston Siren 08/24/2022, 9:14 AM

## 2022-08-24 NOTE — Progress Notes (Signed)
[facility] Progress Note  Patient: Christina Orozco    [id number]  DOA: 08/22/2022    Date of Service: the patient was seen and examined on 08/24/2022  Brief hospital course: 52 year old female with past medical history of stage IV CKD, PVD, diabetes mellitus now well-controlled, hypertension and seizure with previous hospitalizations for stroke and TIA and currently on aspirin, after finishing Brilinta, who presented to the emergency room on 3/12 with complaints of dizziness when moving her head.  While in the emergency room evaluated by neurology, noted to have difficulty with dorsiflexion/plantarflexion of her left foot, which had since resolved.  Workup by MRI noted punctate areas of acute/early subacute ischemia of the lateral margin of left basal ganglia.  Patient brought in for further evaluation.     Assessment and Plan: Acute CVA (cerebrovascular accident) [facility]) Difficult case, since patient's risk factors are already well-controlled and she has been on Brilinta and aspirin, now on aspirin.  Discussed with neurology.  They are recommending restarting Brilinta x 4 weeks.  Consider trial to assess for sleep apnea as underlying cause/factor.  Patient is considering this.  Accepted by inpatient rehab, awaiting insurance approval  Vertigo -pt developed acute vertigo with horizontal nystagmus/nausea/vomiting shortly after returning from MRI, secondary to CVA.  No improvement with Antivert or Reglan.  Patient did seem to have some response with Valium, so will continue.  hopefully inpatient rehab will help with this.  Hypertension -holding Coreg, amlodipine to allow for permissive HTN  Chronic kidney disease (CKD), stage IV (severe) ([place]) -Remains at baseline. Baseline appears to be around 2-3   Generalized anxiety disorder -Continue Duloxetine and Trazodone  Type 2 diabetes mellitus with chronic kidney disease, without long-term current use of insulin (HCC) A1C of 5/9 (06/2022)  with hyperglycemia at 179 on presentation -just monitor with morning labs. Awaiting Speech evaluation as she is reporting difficulty with swallowing so will keep NPO.   Asthma, chronic -Continue PRN bronchodilator  Overweight (BMI 25.0-29.9) Meets criteria with BMI greater than 25       Body mass index is 25.97 kg/m.        Consultants: Neurology  Procedures: Echocardiogram 3/13 notes indeterminate diastolic function, preserved ejection fraction  Antimicrobials: None  Code Status: Full code   Subjective: Continues to have significant dizziness with any turning of her head  Objective: Systolic blood pressure in the 160s-170's allowing for permissive hypertension Vitals:   08/24/22 1129 08/24/22 1509  BP: (!) 175/78 (!) 169/87  Pulse: 82 84  Resp: 16 16  Temp: 98.3 F (36.8 C) 98.4 F (36.9 C)  SpO2: 100% 100%    Intake/Output Summary (Last 24 hours) at 08/24/2022 1657 Last data filed at 08/24/2022 0755 Gross per 24 hour  Intake 360 ml  Output --  Net 360 ml   Filed Weights   08/22/22 1600  Weight: 64.4 kg   Body mass index is 25.97 kg/m.  Exam:  General: Alert and oriented x 3, moderate distress from dizziness HEENT: Normocephalic, atraumatic, mucous membranes are moist Cardiovascular: Regular rate and rhythm, S1-S2 Respiratory: Clear to auscultation bilaterally Abdomen: Soft, nontender, nondistended, positive bowel sounds Musculoskeletal: No clubbing or cyanosis or edema Skin: No skin breaks, tears or lesions Psychiatry: Appropriate, no evidence of psychoses Neurology: Persistent nystagmus when attempting to look to the left  Data Reviewed: No labs today  Disposition:  Status is: Inpatient Remains inpatient appropriate because:  -Approval by CIR -Decision on sleep study trial    Anticipated discharge date: 3/15  Family  Communication: Will call family DVT Prophylaxis: SCD's Start: 08/22/22 1949    Author: Annita Brod  ,MD 08/24/2022 4:57 PM  To reach On-call, see care teams to locate the attending and reach out via [email or web address]. Between 7PM-7AM, please contact night-coverage If you still have difficulty reaching the attending provider, please page the [facility] (Director on Call) for [facility] on amion for assistance.

## 2022-08-24 NOTE — Progress Notes (Signed)
STROKE TEAM PROGRESS NOTE   INTERVAL HISTORY No family at the bedside.  Patient states her dizziness is improving.  She still has weakness and therapies are recommended.  Inpatient rehab.  Patient is interested in participating in the sleep smart study but states she was unable to read it yesterday and her daughter is coming today to read the consent form to work before she can make a decision.  Vital signs stable.  Neurological exam unchanged Vitals:   08/23/22 1938 08/23/22 2135 08/24/22 0729 08/24/22 1129  BP: (!) 176/95 (!) 175/87 (!) 173/79 (!) 175/78  Pulse: 82  79 82  Resp: '18  16 16  '$ Temp: 98.6 F (37 C)  98.4 F (36.9 C) 98.3 F (36.8 C)  TempSrc: Oral  Oral Oral  SpO2: 100%  100% 100%  Weight:      Height:       CBC:  Recent Labs  Lab 08/22/22 1644 08/22/22 1652  WBC 5.5  --   NEUTROABS 3.3  --   HGB 9.5* 9.9*  HCT 29.3* 29.0*  MCV 90.7  --   PLT 252  --    Basic Metabolic Panel:  Recent Labs  Lab 08/22/22 1644 08/22/22 1652  NA 138 142  K 4.1 4.1  CL 111 110  CO2 21*  --   GLUCOSE 179* 175*  BUN 38* 36*  CREATININE 2.61* 2.90*  CALCIUM 9.1  --    Lipid Panel:  Recent Labs  Lab 08/23/22 0247  CHOL 121  TRIG 43  HDL 77  CHOLHDL 1.6  VLDL 9  LDLCALC 35   HgbA1c:  Recent Labs  Lab 08/22/22 1644  HGBA1C 6.3*   Urine Drug Screen:  Recent Labs  Lab 08/22/22 1643  LABOPIA NONE DETECTED  COCAINSCRNUR NONE DETECTED  LABBENZ NONE DETECTED  AMPHETMU NONE DETECTED  THCU NONE DETECTED  LABBARB NONE DETECTED    Alcohol Level  Recent Labs  Lab 08/22/22 1645  ETH <10    IMAGING past 24 hours ECHOCARDIOGRAM COMPLETE  Result Date: 08/23/2022    ECHOCARDIOGRAM REPORT   Patient Name:   Christina Orozco Date of Exam: 08/23/2022 Medical Rec #:  [id number]        Height:       62.0 in Accession #:    [id number]       Weight:       142.0 lb Date of Birth:  10-24-1970       BSA:          1.653 m Patient Age:    52 years         BP:           166/75  mmHg Patient Gender: F                HR:           87 bpm. Exam Location:  Inpatient Procedure: 2D Echo Indications:    TIA  History:        Patient has prior history of Echocardiogram examinations, most                 recent 06/21/2022. Stroke; Risk Factors:Diabetes, Hypertension                 and Dyslipidemia.  Sonographer:    Harvie Junior Referring Phys: [place] T TU IMPRESSIONS  1. Left ventricular ejection fraction, by estimation, is 65 to 70%. Left ventricular ejection fraction by 3D volume is 68 %. The left ventricle  has normal function. The left ventricle has no regional wall motion abnormalities. There is moderate asymmetric left ventricular hypertrophy of the basal-septal segment (14 mm). Hypertrophied papillary muscles. Indeterminate diastolic filling due to E-A fusion.  2. Right ventricular systolic function is normal. The right ventricular size is normal.  3. A small pericardial effusion is present. The pericardial effusion is anterior to the right ventricle.  4. The mitral valve is normal in structure. Trivial mitral valve regurgitation. No evidence of mitral stenosis.  5. The aortic valve is tricuspid. Aortic valve regurgitation is not visualized. No aortic stenosis is present. Conclusion(s)/Recommendation(s): No intracardiac source of embolism detected on this transthoracic study. Consider a transesophageal echocardiogram to exclude cardiac source of embolism if clinically indicated. FINDINGS  Left Ventricle: Left ventricular ejection fraction, by estimation, is 65 to 70%. Left ventricular ejection fraction by 3D volume is 68 %. The left ventricle has normal function. The left ventricle has no regional wall motion abnormalities. The left ventricular internal cavity size was normal in size. There is moderate asymmetric left ventricular hypertrophy of the basal-septal segment. Indeterminate diastolic filling due to E-A fusion. Right Ventricle: The right ventricular size is normal. No increase in right  ventricular wall thickness. Right ventricular systolic function is normal. Left Atrium: Left atrial size was normal in size. Right Atrium: Right atrial size was normal in size. Pericardium: A small pericardial effusion is present. The pericardial effusion is anterior to the right ventricle. Mitral Valve: Chordal SAM. The mitral valve is normal in structure. Trivial mitral valve regurgitation. No evidence of mitral valve stenosis. Tricuspid Valve: The tricuspid valve is normal in structure. Tricuspid valve regurgitation is trivial. No evidence of tricuspid stenosis. Aortic Valve: The aortic valve is tricuspid. Aortic valve regurgitation is not visualized. No aortic stenosis is present. Aortic valve mean gradient measures 3.0 mmHg. Aortic valve peak gradient measures 5.3 mmHg. Aortic valve area, by VTI measures 2.26 cm. Pulmonic Valve: The pulmonic valve was normal in structure. Pulmonic valve regurgitation is trivial. No evidence of pulmonic stenosis. Aorta: The aortic root is normal in size and structure. Venous: The inferior vena cava was not well visualized. IAS/Shunts: The interatrial septum was not well visualized.  LEFT VENTRICLE PLAX 2D LVIDd:         3.80 cm         Diastology LVIDs:         2.20 cm         LV e' medial:    5.98 cm/s LV PW:         1.10 cm         LV E/e' medial:  15.0 LV IVS:        1.00 cm         LV e' lateral:   6.42 cm/s LVOT diam:     2.00 cm         LV E/e' lateral: 14.0 LV SV:         51 LV SV Index:   31 LVOT Area:     3.14 cm        3D Volume EF                                LV 3D EF:    Left  ventricul LV Volumes (MOD)                            ar LV vol d, MOD    76.0 ml                    ejection A2C:                                        fraction LV vol d, MOD    110.0 ml                   by 3D A4C:                                        volume is LV vol s, MOD    27.0 ml                    68 %. A2C: LV vol s, MOD    48.1 ml  A4C:                           3D Volume EF: LV SV MOD A2C:   49.0 ml       3D EF:        68 % LV SV MOD A4C:   110.0 ml      LV EDV:       102 ml LV SV MOD BP:    52.8 ml       LV ESV:       32 ml                                LV SV:        70 ml RIGHT VENTRICLE RV Basal diam:  2.40 cm RV Mid diam:    1.90 cm RV S prime:     16.50 cm/s TAPSE (M-mode): 2.2 cm LEFT ATRIUM             Index        RIGHT ATRIUM           Index LA diam:        3.20 cm 1.94 cm/m   RA Area:     10.90 cm LA Vol (A2C):   21.4 ml 12.95 ml/m  RA Volume:   23.10 ml  13.98 ml/m LA Vol (A4C):   27.8 ml 16.82 ml/m LA Biplane Vol: 26.7 ml 16.16 ml/m  AORTIC VALVE                    PULMONIC VALVE AV Area (Vmax):    2.36 cm     PV Vmax:       0.93 m/s AV Area (Vmean):   2.40 cm     PV Peak grad:  3.5 mmHg AV Area (VTI):     2.26 cm AV Vmax:           115.50 cm/s AV Vmean:          77.800 cm/s AV VTI:            0.224 m AV Peak Grad:  5.3 mmHg AV Mean Grad:      3.0 mmHg LVOT Vmax:         86.77 cm/s LVOT Vmean:        59.333 cm/s LVOT VTI:          0.161 m LVOT/AV VTI ratio: 0.72  AORTA Ao Root diam: 3.10 cm MITRAL VALVE                TRICUSPID VALVE MV Area (PHT): 6.32 cm     TR Peak grad:   19.0 mmHg MV Decel Time: 120 msec     TR Vmax:        218.00 cm/s MR Peak grad: 25.4 mmHg MR Vmax:      252.00 cm/s   SHUNTS MV E velocity: 89.70 cm/s   Systemic VTI:  0.16 m MV A velocity: 105.00 cm/s  Systemic Diam: 2.00 cm MV E/A ratio:  0.85 Cherlynn Kaiser MD Electronically signed by Cherlynn Kaiser MD Signature Date/Time: 08/23/2022/3:55:54 PM    Final     PHYSICAL EXAM  Constitutional: Appears well-developed and well-nourished.  Middle-aged African-American lady Cardiovascular: Normal rate and regular rhythm.  Respiratory: Effort normal, non-labored breathing  Neuro: Mental Status: Patient is awake, alert, oriented to person, place, month, year, and situation. Patient is able to give a clear and coherent history. No signs of  aphasia or neglect Cranial Nerves: II: Visual Fields are full. Pupils are equal, round, and reactive to light.   III,IV, VI: EOMI without ptosis or diploplia.  V: Facial sensation is symmetric to temperature VII: Facial movement is symmetric resting and smiling VIII: Hearing is intact to voice X: Palate elevates symmetrically XI: Shoulder shrug is symmetric. XII: Tongue protrudes midline without atrophy or fasciculations.  Motor: Tone is normal. Bulk is normal.  5/5 strength was present in all four extremities.  Diminished fine finger movements on the left.  Orbits right or left upper extremity. Sensory: Diminished sensation in the left lower extremity Cerebellar: Ataxia present in left upper extremity  NIH stroke scale 0.  Premorbid modified Rankin score 2   ABCD2 score 6   ASSESSMENT/PLAN [name] is a 52 y.o. female with history of CVA, PRES, anxiety/depression, seizures, CKD, TIA, on aspirin and Brilinta at home, headaches, PVD, DM, HTN who presents as a code stroke from home.  She is currently on aspirin and Brilinta and follows with [facility] neurolog[facility].  She was supposed to have an appointment today 3/13 however she has rescheduled it for the 22nd.  She does report that she is still having dizziness, but her weakness has improved.  Additionally she was seen January  Right hemispheric TIA from small vessel disease.  Silent left subcortical lacunar infarct Code Stroke CT head No acute abnormality. Multiple remote lacunar infarcts and chronic microvascular ischemic disease. CTA head & neck No LVO MRI  Punctate focus of acute/early subacute ischemia at the lateral margin of the left basal ganglia.  LDL 35 HgbA1c 5.9 Echo pending VTE prophylaxis -Lovenox    Diet   Diet regular Room service appropriate? No; Fluid consistency: Thin   aspirin 81 mg daily and Brilinta (ticagrelor) 90 mg bid prior to admission, now on aspirin 81 mg daily.  Recommend aspirin and Brilinta for 4  weeks and then stop Brilinta and stay on aspirin alone. 2023- P2Y12- 39 Consider trial Therapy recommendations: Pending Disposition: Pending  Hypertension Home meds: Norvasc 5 mg, Coreg 12.5 mg Stable Permissive hypertension (OK if < 220/120) but gradually normalize in  5-7 days Long-term BP goal normotensive  Hyperlipidemia Home meds: Atorvastatin 80 mg, resumed in hospital LDL 35, goal < 70 Continue statin at discharge  Diabetes type II Controlled Home meds: Ozempic HgbA1c 5.9, goal < 7.0 CBGs Recent Labs    08/22/22 1646  GLUCAP 169*    SSI  Other Stroke Risk Factors Hx stroke/TIA 08/2015 CSF neg, protein 58 07/2017 MRI showed left pontine infarct, EF 65 to 70%, carotid Doppler and MRA negative.  LDL 43, A1c 12.5, patient put on DAPT and Lipitor 40. 08/2017 Loop recorder placed, no A. fib found 02/27/2019 woke up with complete vision loss, gradually vision resumed OD, still has better vision OS.  MRI at that time showed 3 mm acute to early subacute infarct in the dorsal midline of the pons negative MRI head and neck. 07/2019 loop recorder explanted 05/01/2020 MRI showed new T2 hyperintense lesion of the right frontal white matter likely reflect an area of late subacute or chronic infarction. 06/19/2020 Subcentimeter likely subacute infarct at the left lateral aspect of the pons.  MRA head unremarkable, carotid Doppler unremarkable, EF 70 to 75%.  LDL 63, A1c 6.2.  Put on aspirin and Brilinta for 1 months and then back to aspirin and the Plavix.  Continue Lipitor 80. 11/10/2021 - MRI showed possible small right cerebral peduncle infarcts.  MRA negative.  Carotid Doppler unremarkable.  EF 60 to 65%, LDL 34, A1c 6.5, discharged with ASA and brilinta 90 twice daily.  Outpatient follow-up with [facility] neurology, continued on aspirin and Brilinta 60 twice daily. 06/20/2022- TIA with confusion, EEG negative, negative for stroke  Other Active Problems GERD-continue PPI Depression-continue  Cymbalta  Hospital day # 1   Patient presented with dizziness and transient left leg weakness numbness which appears to have improved.  MRI shows a silent left basal ganglia lacunar infarct but no acute ischemia on the right side.  Patient has had multiple episodes of TIAs and strokes over the last 10 years with extensive workup.  TEE in 2019 was negative.  Loop recorder so far has not yet shown paroxysmal A-fib.  She has failed aspirin and Plavix and currently aspirin and Brilinta.  Recommend aspirin and Brilinta for 4 weeks followed by aspirin alone her risk factors seem adequately controlled.  She appears to be at risk for sleep apnea and is interested in considering participation in the sleep smart study.  She will be given written information to review and decide.  It was made clear to her that study participation is voluntary and she will get the same excellent care whether she decides to participate.  She and her daughter will read the consent form and decide and let us know Greater than 50% time during this 35-minute visit was spent in counseling and coordination of care about her TIA and lacunar silent infarct discussion about evaluation and prevention and treatment and answering questions. Antony Contras, MD Medical Director [facility] Pager: [phone] 08/24/2022 12:17 PM ' To contact Stroke Continuity provider, please refer to [email or web address] After hours, contact General Neurology

## 2022-08-24 NOTE — Progress Notes (Signed)
Speech Language Pathology Treatment: Dysphagia  Patient Details Name: Christina Orozco MRN: [id number] DOB: 1970/06/19 Today's Date: 08/24/2022 Time: [id number] SLP Time Calculation (min) (ACUTE ONLY): 16 min  Assessment / Plan / Recommendation Clinical Impression  Pt's processing and verbal responses less delayed today and able to sit upright for po's without complaints of dizziness. She did have one immediate cough after initial sip of water perhaps due to decreased control or larger sip. Subsequent sips thin throughout remainder of session were consumed without indications of decreased airway protection. Upgraded trial of regular texture was masticated swiftly without residue. Pt is in agreement with improvements noted today to upgrade to regular texture, continue thin and pills with thin. Encouraged her to continue to take small sips via straw to better control rate, transit and prepare for laryngeal closure. Will continue to follow for swallow briefly to ensure compliance with strategies.    HPI HPI: Christina Orozco is a 52 y.o. female with medical history significant of CVA/TIA on aspirin and Brilinta, PRES secondary to HTN, seizure, T2DM, CKD 4, PVD, anxiety/depression who presents with left sided weakness and dizziness. MRI puntate focus of acute/early subacute ischemia at the lateral margin of left basal ganglis. No acute hemoorrhage or masse effect,, multiple chronic microhemorrhages in a predominately centarl distribution, old bilateral thalamic infarcts. CTA head and neck without LVO. MBS 05/2022 normal oropharyngeal ability without penetration or aspiration, Barium tablet halted at distal esophgaus without clearance. Pt underwent neck CT showed no tongue mass identified. Pt has undergone adenoidectomy and tonsillectomy. SLP recommended possible ENT consult.      SLP Plan  Continue with current plan of care  Patient needs continued Speech Lanaguage Pathology Services   Recommendations  for follow up therapy are one component of a multi-disciplinary discharge planning process, led by the attending physician.  Recommendations may be updated based on patient status, additional functional criteria and insurance authorization.    Recommendations  Diet recommendations: Regular;Thin liquid Liquids provided via: Cup;Straw Medication Administration: Whole meds with liquid Supervision: Patient able to self feed;Intermittent supervision to cue for compensatory strategies Compensations: Small sips/bites;Slow rate Postural Changes and/or Swallow Maneuvers: Seated upright 90 degrees                Oral Care Recommendations: Oral care BID Follow Up Recommendations: Acute inpatient rehab (3hours/day) (pt states she wants home health) Assistance recommended at discharge: Intermittent Supervision/Assistance SLP Visit Diagnosis: Cognitive communication deficit [id number]) Plan: Continue with current plan of care           Houston Siren  08/24/2022, 9:18 AM

## 2022-08-25 DIAGNOSIS — R531 Weakness: Secondary | ICD-10-CM

## 2022-08-25 DIAGNOSIS — G4733 Obstructive sleep apnea (adult) (pediatric): Secondary | ICD-10-CM | POA: Diagnosis present

## 2022-08-25 MED ORDER — DIAZEPAM 5 MG/ML IJ SOLN
5.0000 mg | Freq: Four times a day (QID) | INTRAMUSCULAR | Status: DC | PRN
  Administered 2022-08-25 – 2022-08-26 (×2): 5 mg via INTRAVENOUS
  Filled 2022-08-25 (×2): qty 2

## 2022-08-25 MED ORDER — DIAZEPAM 5 MG/ML IJ SOLN
5.0000 mg | INTRAMUSCULAR | Status: AC
  Administered 2022-08-25: 5 mg via INTRAVENOUS
  Filled 2022-08-25: qty 2

## 2022-08-25 NOTE — Assessment & Plan Note (Signed)
Tested positive with neuro trial.  Trying CPAP tonight.

## 2022-08-25 NOTE — Progress Notes (Signed)
Occupational Therapy Treatment Patient Details Name: Christina Orozco MRN: [id number] DOB: 05-Jan-1971 Today's Date: 08/25/2022   History of present illness Pt is a 52 y.o. F who presents with left sided weakness and slurred speech. CTH negative for acute abnormality. MRI with punctuate focus of acute/early subacute ischemia at the lateral margin of the left basal ganglia. PMH: hypertension, hyperlipidemia, diabetes, chronic kidney disease, prior strokes, history of seizures possibly secondary to PRES (2019) versus underlying epilepsy   OT comments  Patient seen following an episode of decreased responsiveness. Patient requiring assist of 2 to get to EOB, max assist to donn socks and mod assist x2 to stand from EOB and take side steps towards HOB.  Patient demonstrated left lateral leaning while seated on EOB but was able to raise BUE to 90 degrees shoulder flexion seated on EOB and full ROM in supine. Patient assisted back to supine and performed grooming with patient demonstrating good attention and awareness of left side. Acute OT to continue to follow to address bathing, dressing, and functional transfers.    Recommendations for follow up therapy are one component of a multi-disciplinary discharge planning process, led by the attending physician.  Recommendations may be updated based on patient status, additional functional criteria and insurance authorization.    Follow Up Recommendations  Acute inpatient rehab (3hours/day)     Assistance Recommended at Discharge Frequent or constant Supervision/Assistance  Patient can return home with the following  A lot of help with walking and/or transfers;A lot of help with bathing/dressing/bathroom;Assistance with cooking/housework;Direct supervision/assist for medications management;Direct supervision/assist for financial management;Assist for transportation;Help with stairs or ramp for entrance   Equipment Recommendations  Other (comment) (defer)     Recommendations for Other Services      Precautions / Restrictions Precautions Precautions: Fall Restrictions Weight Bearing Restrictions: No       Mobility Bed Mobility Overal bed mobility: Needs Assistance Bed Mobility: Supine to Sit, Sit to Supine     Supine to sit: Mod assist Sit to supine: HOB elevated, Mod assist, +2 for physical assistance, +2 for safety/equipment   General bed mobility comments: mod assist x2 to manage BLEs and trunk due to Left lateral leaning    Transfers Overall transfer level: Needs assistance Equipment used: 2 person hand held assist Transfers: Sit to/from Stand Sit to Stand: Mod assist, +2 physical assistance, +2 safety/equipment           General transfer comment: Pt cued to grab arms of therapists with face-to-face approach to stand from EOB, modAx2 for stability with transfer and stepping to the R to [facility] with noted LOB and modAx2 to recover.     Balance Overall balance assessment: Needs assistance Sitting-balance support: Feet supported, No upper extremity supported, Single extremity supported, Feet unsupported Sitting balance-Leahy Scale: Poor Sitting balance - Comments: left lateral leaning while on EOB and able to correct with verbal and physical cues but return to lateral leaning. Lateral leaning on right elbow to address Postural control: Left lateral lean Standing balance support: Bilateral upper extremity supported, During functional activity Standing balance-Leahy Scale: Poor Standing balance comment: reliant on bilateral UE support and external physical assistance                           ADL either performed or assessed with clinical judgement   ADL Overall ADL's : Needs assistance/impaired     Grooming: Wash/dry hands;Wash/dry face;Supervision/safety;Bed level  Lower Body Dressing: Maximal assistance;Sitting/lateral leans Lower Body Dressing Details (indicate cue type and reason): max  assist to donn socks while seated on EOB               General ADL Comments: Patient limited due to dizziness    Extremity/Trunk Assessment              Vision       Perception     Praxis      Cognition Arousal/Alertness: Awake/alert Behavior During Therapy: WFL for tasks assessed/performed Overall Cognitive Status: Impaired/Different from baseline                                 General Comments: Patient slow to respond snd low voice following episode with PT earlier        Exercises      Shoulder Instructions       General Comments BP 179/92 sitting, HR 87 and SPO2 at 100%    Pertinent Vitals/ Pain       Pain Assessment Pain Assessment: Faces Faces Pain Scale: Hurts little more Pain Location: dizziness Pain Descriptors / Indicators: Discomfort Pain Intervention(s): Monitored during session, Limited activity within patient's tolerance  Home Living                                          Prior Functioning/Environment              Frequency  Min 3X/week        Progress Toward Goals  OT Goals(current goals can now be found in the care plan section)  Progress towards OT goals: Progressing toward goals  Acute Rehab OT Goals Patient Stated Goal: get better OT Goal Formulation: With patient Time For Goal Achievement: 09/06/22 Potential to Achieve Goals: Good ADL Goals Pt Will Perform Upper Body Dressing: with modified independence;sitting Pt Will Perform Lower Body Dressing: with modified independence;sit to/from stand;sitting/lateral leans Pt Will Transfer to Toilet: with modified independence;ambulating;regular height toilet Pt Will Perform Tub/Shower Transfer: with modified independence;ambulating;shower seat  Plan Discharge plan remains appropriate    Co-evaluation                 AM-PAC OT "6 Clicks" Daily Activity     Outcome Measure   Help from another person eating meals?: A  Little Help from another person taking care of personal grooming?: A Little Help from another person toileting, which includes using toliet, bedpan, or urinal?: A Lot Help from another person bathing (including washing, rinsing, drying)?: A Lot Help from another person to put on and taking off regular upper body clothing?: A Lot Help from another person to put on and taking off regular lower body clothing?: A Lot 6 Click Score: 14    End of Session Equipment Utilized During Treatment: Gait belt  OT Visit Diagnosis: Unsteadiness on feet (R26.81);Other abnormalities of gait and mobility (R26.89);Muscle weakness (generalized) (M62.81)   Activity Tolerance Other (comment) (limited due to dizziness)   Patient Left in bed;with call bell/phone within reach;with bed alarm set   Nurse Communication Mobility status        Time: [id number] OT Time Calculation (min): 22 min  Charges: OT General Charges $OT Visit: 1 Visit OT Treatments $Self Care/Home Management : 8-22 mins  Lodema Hong, [place]  Office [phone]  Trixie Dredge 08/25/2022, 2:43 PM

## 2022-08-25 NOTE — Progress Notes (Signed)
[facility] Progress Note  Patient: Christina Orozco    [id number]  DOA: 08/22/2022    Date of Service: the patient was seen and examined on 08/25/2022  Brief hospital course: 52 year old female with past medical history of stage IV CKD, PVD, diabetes mellitus now well-controlled, hypertension and seizure with previous hospitalizations for stroke and TIA and currently on aspirin, after finishing Brilinta, who presented to the emergency room on 3/12 with complaints of dizziness when moving her head.  While in the emergency room evaluated by neurology, noted to have difficulty with dorsiflexion/plantarflexion of her left foot, which had since resolved.  Workup by MRI noted punctate areas of acute/early subacute ischemia of the lateral margin of left basal ganglia.  Patient brought in for further evaluation.     Assessment and Plan: Acute CVA (cerebrovascular accident) [facility]) Difficult case, since patient's risk factors are already well-controlled and she has been on Brilinta and aspirin, now on aspirin.  Discussed with neurology.  They are recommending restarting Brilinta x 4 weeks.  Patient completed sleep study throughout, as below.  Accepted by inpatient rehab, bed available 3/16  OSA (obstructive sleep apnea) Tested positive with neuro trial.  Trying CPAP tonight.  Vertigo -pt developed acute vertigo with horizontal nystagmus/nausea/vomiting shortly after returning from MRI, secondary to CVA.  No improvement with Antivert or Reglan, but did well with IV Valium.  Attempted to try p.o., but to no avail.  Consider in the future when trying to go with p.o. Valium, increased dosing.  Hypertension -holding Coreg, amlodipine to allow for permissive HTN  Chronic kidney disease (CKD), stage IV (severe) ([place]) -Remains at baseline. Baseline appears to be around 2-3   Generalized anxiety disorder -Continue Duloxetine and Trazodone  Type 2 diabetes mellitus with chronic kidney disease,  without long-term current use of insulin (HCC) A1C of 5/9 (06/2022) with hyperglycemia at 179 on presentation.  Continue to monitor  Asthma, chronic -Continue PRN bronchodilator  Overweight (BMI 25.0-29.9) Meets criteria with BMI greater than 25       Body mass index is 25.97 kg/m.        Consultants: Neurology  Procedures: Echocardiogram 3/13 notes indeterminate diastolic function, preserved ejection fraction  Antimicrobials: None  Code Status: Full code   Subjective: Reports improvement with IV Valium for dizziness, but none with p.o.  Objective: Systolic blood pressure in the 160s-170's allowing for permissive hypertension Vitals:   08/25/22 0725 08/25/22 1119  BP: (!) 165/84 (!) 162/82  Pulse: 74 85  Resp: 16 17  Temp: 98.1 F (36.7 C) 97.6 F (36.4 C)  SpO2: 100% 100%    Intake/Output Summary (Last 24 hours) at 08/25/2022 1239 Last data filed at 08/25/2022 0920 Gross per 24 hour  Intake 720 ml  Output 600 ml  Net 120 ml    Filed Weights   08/22/22 1600  Weight: 64.4 kg   Body mass index is 25.97 kg/m.  Exam:  General: Alert and oriented x 3, mild distress from dizziness HEENT: Normocephalic, atraumatic, mucous membranes are moist Cardiovascular: Regular rate and rhythm, S1-S2 Respiratory: Clear to auscultation bilaterally Abdomen: Soft, nontender, nondistended, positive bowel sounds Musculoskeletal: No clubbing or cyanosis or edema Skin: No skin breaks, tears or lesions Psychiatry: Appropriate, no evidence of psychoses Neurology: Persistent nystagmus when attempting to look to the left  Data Reviewed: No labs today  Disposition:  Status is: Inpatient Remains inpatient appropriate because:  -Transfer to CIR 3/16    Anticipated discharge date: 3/16  Family Communication: Will call family  DVT Prophylaxis: SCD's Start: 08/22/22 1949    Author: Annita Brod ,MD 08/25/2022 12:39 PM  To reach On-call, see care teams to  locate the attending and reach out via [email or web address]. Between 7PM-7AM, please contact night-coverage If you still have difficulty reaching the attending provider, please page the [facility] (Director on Call) for [facility] on amion for assistance.

## 2022-08-25 NOTE — H&P (Incomplete)
Physical Medicine and Rehabilitation Admission H&P    Chief Complaint  Patient presents with   Code Stroke  : HPI: ***  ROS Past Medical History:  Diagnosis Date   Anemia    severe   Anxiety    Asthma    CKD (chronic kidney disease)    stage IV   DDD (degenerative disc disease), cervical    Depression    Diabetes mellitus ([place])    Dislocated shoulder    right   GERD (gastroesophageal reflux disease)    Headache    migraines (about once a month)   History of kidney stones    Hypertension    Neuropathy    Peripheral vascular disease ([place])    Pneumonia    Stroke [facility])    Past Surgical History:  Procedure Laterality Date   ADENOIDECTOMY     APPENDECTOMY     CERVICAL SPINE SURGERY  06/2012   C5-C6   EYE SURGERY     left eye surgery    FOOT SURGERY     KENALOG INJECTION Left 03/10/2021   Procedure: Intravitreal Avastin Injection;  Surgeon: Sherlynn Stalls, MD;  Location: [place];  Service: Ophthalmology;  Laterality: Left;   LOOP RECORDER INSERTION N/A 08/27/2017   Procedure: LOOP RECORDER INSERTION;  Surgeon: Sanda Klein, MD;  Location: [place] CV LAB;  Service: Cardiovascular;  Laterality: N/A;   MEMBRANE PEEL Left 03/10/2021   Procedure: MEMBRANE PEEL;  Surgeon: Sherlynn Stalls, MD;  Location: [place];  Service: Ophthalmology;  Laterality: Left;   PARS PLANA VITRECTOMY Left 03/10/2021   Procedure: TWENTY-FIVE GAUGE PARS PLANA VITRECTOMY;  Surgeon: Sherlynn Stalls, MD;  Location: [place];  Service: Ophthalmology;  Laterality: Left;   PHOTOCOAGULATION WITH LASER Left 03/10/2021   Procedure: PHOTOCOAGULATION WITH LASER;  Surgeon: Sherlynn Stalls, MD;  Location: [place];  Service: Ophthalmology;  Laterality: Left;   SHOULDER SURGERY     TEE WITHOUT CARDIOVERSION N/A 08/27/2017   Procedure: TRANSESOPHAGEAL ECHOCARDIOGRAM (TEE);  Surgeon: Sanda Klein, MD;  [facility] ENDOSCOPY;  Service: Cardiovascular;  Laterality: N/A;   TONSILLECTOMY     Family History  Problem  Relation Age of Onset   Vascular Disease Mother    CAD Mother    Heart failure Mother    Heart disease Other    Cancer Other        colon, stomach, pancreatic, lung   Diabetes Other    Breast cancer Sister 30   Seizures Maternal Uncle    Social History:  reports that she has never smoked. She has never used smokeless tobacco. She reports that she does not drink alcohol and does not use drugs. Allergies:  Allergies  Allergen Reactions   Lisinopril Swelling and Other (See Comments)    Facial swelling  angioedema   Penicillins Hives    Has patient had a PCN reaction causing immediate rash, facial/tongue/throat swelling, SOB or lightheadedness with hypotension: yes Has patient had a PCN reaction causing severe rash involving mucus membranes or skin necrosis: No Has patient had a PCN reaction that required hospitalization No Has patient had a PCN reaction occurring within the last 10 years: No If all of the above answers are "NO", then may proceed wit Rash on buttock that makes sitting very difficult   Quetiapine Other (See Comments)    Unknown    Gabapentin Swelling and Other (See Comments)    "I thought I was having another stroke." dizziness   Medications Prior to Admission  Medication Sig  Dispense Refill   albuterol (PROAIR HFA) 108 (90 Base) MCG/ACT inhaler INHALE 1 OR 2 PUFFS INTO THE LUNGS EVERY 6 HOURS AS NEEDED FOR WHEEZING OR SHORTNESS OF BREATH (Patient taking differently: Inhale 2 puffs into the lungs as needed for shortness of breath or wheezing.) 8.5 g 0   amLODipine (NORVASC) 5 MG tablet TAKE 1 TABLET (5 MG TOTAL) BY MOUTH DAILY. (AM) (Patient taking differently: Take 5 mg by mouth daily.) 90 tablet 1   aspirin EC 81 MG tablet Take 1 tablet (81 mg total) by mouth daily. 100 tablet 5   atorvastatin (LIPITOR) 80 MG tablet Take 80 mg by mouth at bedtime.     carvedilol (COREG) 12.5 MG tablet Take 1 tablet (12.5 mg total) by mouth 2 (two) times daily with a meal. 180 tablet  2   DULoxetine (CYMBALTA) 60 MG capsule Take 60 mg by mouth at bedtime.     montelukast (SINGULAIR) 10 MG tablet Take 10 mg by mouth at bedtime.     OZEMPIC, 0.25 OR 0.5 MG/DOSE, 2 MG/3ML SOPN Inject 0.5 mg into the skin See admin instructions. Inject 0.5 mg subcutaneous every 10 days     pantoprazole (PROTONIX) 40 MG tablet Take 1 tablet (40 mg total) by mouth daily. 30 tablet 0   ticagrelor (BRILINTA) 90 MG TABS tablet Take 90 mg by mouth in the morning and at bedtime.     acetaminophen (TYLENOL) 325 MG tablet Take 1-2 tablets (325-650 mg total) by mouth every 4 (four) hours as needed for mild pain. (Patient not taking: Reported on 08/24/2022)     azithromycin (ZITHROMAX) 250 MG tablet Take 1 tablet (250 mg total) by mouth daily. Take first 2 tablets together, then 1 every day until finished. (Patient not taking: Reported on 06/21/2022) 6 tablet 0   glucose blood (CONTOUR NEXT TEST) test strip 1 each by Other route 2 (two) times daily. And lancets 2/day (Patient taking differently: 1 each by Other route 2 (two) times daily.) 100 each 12   traZODone (DESYREL) 50 MG tablet Take 1 tablet (50 mg total) by mouth at bedtime as needed for sleep. (Patient not taking: Reported on 06/21/2022) 30 tablet 0      Home: Home Living Family/patient expects to be discharged to:: Private residence Living Arrangements: Children (52 yo dtr lives with her; she goes to [employer] [facility]) Available Help at Discharge: Family, Available 24 hours/day Type of Home: Apartment Home Access: Level entry Home Layout: One level Bathroom Shower/Tub: Tub/shower unit, Architectural technologist: Handicapped height Bathroom Accessibility: Yes Home Equipment: Conservation officer, nature (2 wheels), [employer] - single point, Civil engineer, contracting, Grab bars - tub/shower, Hand held shower head, Grab bars - toilet Additional Comments: trying to get a PCA back  Lives With: Daughter   Functional History: Prior Function Prior Level of Function : Needs  assist Mobility Comments: Uses cane, limited community ambulator. received HHOT and HHRN ADLs Comments: able to perform ADLs and light IADLs  Functional Status:  Mobility: Bed Mobility Overal bed mobility: Needs Assistance Bed Mobility: Supine to Sit, Sit to Supine Supine to sit: Mod assist Sit to supine: HOB elevated, Mod assist, +2 for physical assistance, +2 for safety/equipment General bed mobility comments: mod assist x2 to manage BLEs and trunk due to Left lateral leaning Transfers Overall transfer level: Needs assistance Equipment used: 2 person hand held assist Transfers: Sit to/from Stand Sit to Stand: Mod assist, +2 physical assistance, +2 safety/equipment General transfer comment: Pt cued to grab arms of  therapists with face-to-face approach to stand from EOB, modAx2 for stability with transfer and stepping to the R to [facility] with noted LOB and modAx2 to recover. Ambulation/Gait Ambulation/Gait assistance: Mod assist, +2 physical assistance, +2 safety/equipment Gait Distance (Feet): 2 Feet Assistive device: 2 person hand held assist Gait Pattern/deviations: Step-through pattern, Decreased step length - right, Decreased step length - left, Decreased stride length, Decreased weight shift to right, Ataxic, Narrow base of support, Trunk flexed, Knees buckling General Gait Details: Pt only taking a few steps to the R towards [facility] with bil HHA today for pt safety as pt had a change in presentation during session. ModAx2 for stability, to recover when pt's knees buckled, and to direct pt to the R. Gait velocity: reduced Gait velocity interpretation: <1.31 ft/sec, indicative of household ambulator    ADL: ADL Overall ADL's : Needs assistance/impaired Eating/Feeding: Set up, Sitting Grooming: Wash/dry hands, Wash/dry face, Supervision/safety, Bed level Upper Body Bathing: Sitting, Moderate assistance Lower Body Bathing: Sitting/lateral leans, Maximal assistance Upper Body Dressing :  Moderate assistance, Sitting Lower Body Dressing: Maximal assistance, Sitting/lateral leans Lower Body Dressing Details (indicate cue type and reason): max assist to donn socks while seated on EOB Toilet Transfer: Moderate assistance, +2 for safety/equipment, +2 for physical assistance Toileting- Clothing Manipulation and Hygiene: Moderate assistance Functional mobility during ADLs: Moderate assistance, +2 for physical assistance, Minimal assistance General ADL Comments: Patient limited due to dizziness  Cognition: Cognition Overall Cognitive Status: Impaired/Different from baseline Arousal/Alertness: Awake/alert Orientation Level: Oriented X4 Year: 2024 Day of Week: Correct Attention: Sustained Sustained Attention: Appears intact Memory: Impaired Memory Impairment: Storage deficit, Retrieval deficit (3/5 word recall) Awareness: Appears intact Problem Solving: Appears intact Safety/Judgment: Appears intact Cognition Arousal/Alertness: Awake/alert Behavior During Therapy: WFL for tasks assessed/performed Overall Cognitive Status: Impaired/Different from baseline General Comments: Patient slow to respond snd low voice following episode with PT earlier  Physical Exam: Blood pressure (!) 179/96, pulse 87, temperature 97.6 F (36.4 C), temperature source Oral, resp. rate 17, height 5\' 2"  (1.575 m), weight 64.4 kg, last menstrual period 05/22/2018, SpO2 100 %. Physical Exam  No results found for this or any previous visit (from the past 48 hour(s)). No results found.    Blood pressure (!) 179/96, pulse 87, temperature 97.6 F (36.4 C), temperature source Oral, resp. rate 17, height 5\' 2"  (1.575 m), weight 64.4 kg, last menstrual period 05/22/2018, SpO2 100 %.  Medical Problem List and Plan: 1. Functional deficits secondary to ***  -patient may *** shower  -ELOS/Goals: *** 2.  Antithrombotics: -DVT/anticoagulation:  {VTE PROPHYLAXIS/ANTICOAGULATION -  [id number]  -antiplatelet therapy: *** 3. Pain Management: *** 4. Mood/Behavior/Sleep: ***  -antipsychotic agents: *** 5. Neuropsych/cognition: This patient *** capable of making decisions on *** own behalf. 6. Skin/Wound Care: *** 7. Fluids/Electrolytes/Nutrition: ***     ***  Bary Leriche, PA-C 08/25/2022

## 2022-08-25 NOTE — H&P (Incomplete)
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 Code stroke called and evaluated by neurology. CT head and CTA imaging were negative. Physical exam significant for motor 5 out of 5 in all extremities, no drift seen.  However, left foot 0/5 dorsi flexion and plantarflexion as patient unable to flex/extend foot at all. Positive LUE ataxia. MRI brain with punctate focus of acute/early subacute ischemia at the lateral margin of the left BG. Initiated Lovenox for DVT prophylaxis. She was on aspirin and Brilinta 60 mg BID prior to admission due to recurrent stroke 11/2021. Followed at [facility] neurology.  Neurology recommended aspirin and Brilinta for four weeks then stop the Brilinta and stay on aspirin alone. Her past medical history is significant for hypertension on Norvasc and Coreg, hyperlipidemia on atorvastatin 80 mg, DM2-controlled (A1c 5.9%), prior history of multiple strokes/TIA, GERD, depression on Cymbalta. She appears to be at risk for sleep apnea and is interested in considering participation in the sleep smart study.  She was given written information to review and decided to consent. CPAP tolerability test completed 3/15-3/16. She is tolerating regular diet. The patient continued to display a L lateral lean when sitting EOB, needing modA to prevent LOB, but was responding more clearly but with a delay as the session progressed. Per PT notes on 3/15, the patient stood to take steps towards [facility] to return to supine for her safety, needing modAx2 for all functional mobility. By the end of the session, she continued to display progress in her speech clarity and response speed, but was still delayed. The patient requires inpatient medicine and rehabilitation  evaluations and services for ongoing dysfunction secondary to right hemispheric TIA, prior old strokes.  Review of Systems  Constitutional:  Negative for fever.  HENT:  Negative for hearing loss and sore throat.   Eyes:        Blurred vision right eye  Respiratory:  Negative for cough, shortness of breath and wheezing.   Cardiovascular:  Negative for chest pain and palpitations.  Gastrointestinal:        Nausea with dizziness/when ambulating  Genitourinary:  Negative for dysuria and urgency.  Musculoskeletal:  Positive for neck pain.  Neurological:  Positive for dizziness and headaches.  Psychiatric/Behavioral:  Positive for depression.        Said she slept very well with CPAP last night   Past Medical History:  Diagnosis Date   Anemia    severe   Anxiety    Asthma    CKD (chronic kidney disease)    stage IV   DDD (degenerative disc disease), cervical    Depression    Diabetes mellitus (HCC)    Dislocated shoulder    right   GERD (gastroesophageal reflux disease)    Headache    migraines (about once a month)   History of kidney stones    Hypertension    Neuropathy    Peripheral vascular disease ([place])    Pneumonia    Stroke [facility])    Past Surgical History:  Procedure Laterality Date   ADENOIDECTOMY     APPENDECTOMY     CERVICAL SPINE SURGERY  06/2012   C5-C6   EYE SURGERY     left eye surgery    FOOT SURGERY     KENALOG  INJECTION Left 03/10/2021   Procedure: Intravitreal Avastin Injection;  Surgeon: Sherlynn Stalls, MD;  Location: [place];  Service: Ophthalmology;  Laterality: Left;   LOOP RECORDER INSERTION N/A 08/27/2017   Procedure: LOOP RECORDER INSERTION;  Surgeon: Sanda Klein, MD;  Location: [place] CV LAB;  Service: Cardiovascular;  Laterality: N/A;   MEMBRANE PEEL Left 03/10/2021   Procedure: MEMBRANE PEEL;  Surgeon: Sherlynn Stalls, MD;  Location: [place];  Service: Ophthalmology;  Laterality: Left;   PARS PLANA VITRECTOMY Left 03/10/2021   Procedure:  TWENTY-FIVE GAUGE PARS PLANA VITRECTOMY;  Surgeon: Sherlynn Stalls, MD;  Location: [place];  Service: Ophthalmology;  Laterality: Left;   PHOTOCOAGULATION WITH LASER Left 03/10/2021   Procedure: PHOTOCOAGULATION WITH LASER;  Surgeon: Sherlynn Stalls, MD;  Location: [place];  Service: Ophthalmology;  Laterality: Left;   SHOULDER SURGERY     TEE WITHOUT CARDIOVERSION N/A 08/27/2017   Procedure: TRANSESOPHAGEAL ECHOCARDIOGRAM (TEE);  Surgeon: Sanda Klein, MD;  Location[facility] ENDOSCOPY;  Service: Cardiovascular;  Laterality: N/A;   TONSILLECTOMY     Family History  Problem Relation Age of Onset   Vascular Disease Mother    CAD Mother    Heart failure Mother    Heart disease Other    Cancer Other        colon, stomach, pancreatic, lung   Diabetes Other    Breast cancer Sister 30   Seizures Maternal Uncle    Social History:  reports that she has never smoked. She has never used smokeless tobacco. She reports that she does not drink alcohol and does not use drugs. Allergies:  Allergies  Allergen Reactions   Lisinopril Swelling and Other (See Comments)    Facial swelling  angioedema   Penicillins Hives    Has patient had a PCN reaction causing immediate rash, facial/tongue/throat swelling, SOB or lightheadedness with hypotension: yes Has patient had a PCN reaction causing severe rash involving mucus membranes or skin necrosis: No Has patient had a PCN reaction that required hospitalization No Has patient had a PCN reaction occurring within the last 10 years: No If all of the above answers are "NO", then may proceed wit Rash on buttock that makes sitting very difficult   Quetiapine Other (See Comments)    Unknown    Gabapentin Swelling and Other (See Comments)    "I thought I was having another stroke." dizziness   Medications Prior to Admission  Medication Sig Dispense Refill   albuterol (PROAIR HFA) 108 (90 Base) MCG/ACT inhaler INHALE 1 OR 2 PUFFS INTO THE LUNGS EVERY 6 HOURS AS NEEDED  FOR WHEEZING OR SHORTNESS OF BREATH (Patient taking differently: Inhale 2 puffs into the lungs as needed for shortness of breath or wheezing.) 8.5 g 0   amLODipine (NORVASC) 5 MG tablet TAKE 1 TABLET (5 MG TOTAL) BY MOUTH DAILY. (AM) (Patient taking differently: Take 5 mg by mouth daily.) 90 tablet 1   aspirin EC 81 MG tablet Take 1 tablet (81 mg total) by mouth daily. 100 tablet 5   atorvastatin (LIPITOR) 80 MG tablet Take 80 mg by mouth at bedtime.     carvedilol (COREG) 12.5 MG tablet Take 1 tablet (12.5 mg total) by mouth 2 (two) times daily with a meal. 180 tablet 2   DULoxetine (CYMBALTA) 60 MG capsule Take 60 mg by mouth at bedtime.     montelukast (SINGULAIR) 10 MG tablet Take 10 mg by mouth at bedtime.     OZEMPIC, 0.25 OR 0.5 MG/DOSE, 2 MG/3ML SOPN Inject  0.5 mg into the skin See admin instructions. Inject 0.5 mg subcutaneous every 10 days     pantoprazole (PROTONIX) 40 MG tablet Take 1 tablet (40 mg total) by mouth daily. 30 tablet 0   ticagrelor (BRILINTA) 90 MG TABS tablet Take 90 mg by mouth in the morning and at bedtime.     acetaminophen (TYLENOL) 325 MG tablet Take 1-2 tablets (325-650 mg total) by mouth every 4 (four) hours as needed for mild pain. (Patient not taking: Reported on 08/24/2022)     azithromycin (ZITHROMAX) 250 MG tablet Take 1 tablet (250 mg total) by mouth daily. Take first 2 tablets together, then 1 every day until finished. (Patient not taking: Reported on 06/21/2022) 6 tablet 0   glucose blood (CONTOUR NEXT TEST) test strip 1 each by Other route 2 (two) times daily. And lancets 2/day (Patient taking differently: 1 each by Other route 2 (two) times daily.) 100 each 12   traZODone (DESYREL) 50 MG tablet Take 1 tablet (50 mg total) by mouth at bedtime as needed for sleep. (Patient not taking: Reported on 06/21/2022) 30 tablet 0      Home: Home Living Family/patient expects to be discharged to:: Private residence Living Arrangements: Children (1 yo dtr lives with  her; she goes to [employer] [facility]) Available Help at Discharge: Family, Available 24 hours/day Type of Home: Apartment Home Access: Level entry Home Layout: One level Bathroom Shower/Tub: Tub/shower unit, Architectural technologist: Handicapped height Bathroom Accessibility: Yes Home Equipment: Conservation officer, nature (2 wheels), [employer] - single point, Civil engineer, contracting, Grab bars - tub/shower, Hand held shower head, Grab bars - toilet Additional Comments: trying to get a PCA back  Lives With: Daughter   Functional History: Prior Function Prior Level of Function : Needs assist Mobility Comments: Uses cane, limited community ambulator. received HHOT and HHRN ADLs Comments: able to perform ADLs and light IADLs  Functional Status:  Mobility: Bed Mobility Overal bed mobility: Needs Assistance Bed Mobility: Supine to Sit, Sit to Supine Supine to sit: Mod assist Sit to supine: HOB elevated, Mod assist, +2 for physical assistance, +2 for safety/equipment General bed mobility comments: mod assist x2 to manage BLEs and trunk due to Left lateral leaning Transfers Overall transfer level: Needs assistance Equipment used: 2 person hand held assist Transfers: Sit to/from Stand Sit to Stand: Mod assist, +2 physical assistance, +2 safety/equipment General transfer comment: Pt cued to grab arms of therapists with face-to-face approach to stand from EOB, modAx2 for stability with transfer and stepping to the R to [facility] with noted LOB and modAx2 to recover. Ambulation/Gait Ambulation/Gait assistance: Mod assist, +2 physical assistance, +2 safety/equipment Gait Distance (Feet): 2 Feet Assistive device: 2 person hand held assist Gait Pattern/deviations: Step-through pattern, Decreased step length - right, Decreased step length - left, Decreased stride length, Decreased weight shift to right, Ataxic, Narrow base of support, Trunk flexed, Knees buckling General Gait Details: Pt only taking a few steps to the R towards  [facility] with bil HHA today for pt safety as pt had a change in presentation during session. ModAx2 for stability, to recover when pt's knees buckled, and to direct pt to the R. Gait velocity: reduced Gait velocity interpretation: <1.31 ft/sec, indicative of household ambulator    ADL: ADL Overall ADL's : Needs assistance/impaired Eating/Feeding: Set up, Sitting Grooming: Wash/dry hands, Wash/dry face, Supervision/safety, Bed level Upper Body Bathing: Sitting, Moderate assistance Lower Body Bathing: Sitting/lateral leans, Maximal assistance Upper Body Dressing : Moderate assistance, Sitting Lower Body Dressing:  Maximal assistance, Sitting/lateral leans Lower Body Dressing Details (indicate cue type and reason): max assist to donn socks while seated on EOB Toilet Transfer: Moderate assistance, +2 for safety/equipment, +2 for physical assistance Toileting- Clothing Manipulation and Hygiene: Moderate assistance Functional mobility during ADLs: Moderate assistance, +2 for physical assistance, Minimal assistance General ADL Comments: Patient limited due to dizziness  Cognition: Cognition Overall Cognitive Status: Impaired/Different from baseline Arousal/Alertness: Awake/alert Orientation Level: Oriented X4 Year: 2024 Day of Week: Correct Attention: Sustained Sustained Attention: Appears intact Memory: Impaired Memory Impairment: Storage deficit, Retrieval deficit (3/5 word recall) Awareness: Appears intact Problem Solving: Appears intact Safety/Judgment: Appears intact Cognition Arousal/Alertness: Awake/alert Behavior During Therapy: WFL for tasks assessed/performed Overall Cognitive Status: Impaired/Different from baseline General Comments: Patient slow to respond snd low voice following episode with PT earlier  Physical Exam: Blood pressure (!) 179/96, pulse 87, temperature 97.6 F (36.4 C), temperature source Oral, resp. rate 17, height 5\' 2"  (1.575 m), weight 64.4 kg, last  menstrual period 05/22/2018, SpO2 100 %. Physical Exam Constitutional:      General: She is not in acute distress. HENT:     Head: Normocephalic and atraumatic.  Cardiovascular:     Rate and Rhythm: Normal rate and regular rhythm.  Pulmonary:     Effort: Pulmonary effort is normal.     Breath sounds: Normal breath sounds.  Abdominal:     General: Bowel sounds are normal.     Palpations: Abdomen is soft.  Neurological:     Mental Status: She is alert.  Psychiatric:        Mood and Affect: Mood normal.        Behavior: Behavior normal.     No results found for this or any previous visit (from the past 48 hour(s)). No results found.    Blood pressure (!) 179/96, pulse 87, temperature 97.6 F (36.4 C), temperature source Oral, resp. rate 17, height 5\' 2"  (1.575 m), weight 64.4 kg, last menstrual period 05/22/2018, SpO2 100 %.  Medical Problem List and Plan: 1. Functional deficits secondary to ***  -patient may *** shower  -ELOS/Goals: ***  2.  Antithrombotics: -DVT/anticoagulation:  Pharmaceutical: Lovenox  -antiplatelet therapy: Aspirin 81 mg daily and Brilinta 90 mg BID  3. Pain Management: Tylenol as needed  4. Mood/Behavior/Sleep: LCSW to evaluate and provide emotional support -generalized anxiety disorder  -depression: continue Cymbalta  -antipsychotic agents: n/a  5. Neuropsych/cognition: This patient *** capable of making decisions on *** own behalf.  6. Skin/Wound Care: Routine skin care checks   7. Fluids/Electrolytes/Nutrition: Routine Is and Os and follow-up chemistries  8: Hypertension: monitor TID and prn  9: Hyperlipidemia: continue statin  10: OSA: CPAP mask test>>passed first phase  11: Dizziness/vertigo: ? continue diazepam 5 mg q 4 hours prn (received two doses on Thursday and one on Friday). Antivert was initially given and stopped.  - ? vestibular eval ?  12: Asthma, chronic: home regimen: albuterol inhaler as needed, Singulair (not  restarted)  13: DM2-controlled: CBGs q AC and q HS; home regimen includes Ozempic q 10 days  -trend CBGS and decide on carb modified diet, SSI, etc.  14: CKD IV: Baseline creatinine ~2.5-3.0  -follow-up BMP  15: Anemia of chronic disease: hemoglobin stable; follow-up CBC ***  Barbie Banner, PA-C 08/25/2022

## 2022-08-25 NOTE — Progress Notes (Signed)
Physical Therapy Treatment Patient Details Name: Christina Orozco MRN: [id number] DOB: [birth year] Today's Date: 08/25/2022   History of Present Illness Pt is a 52 y.o. F who presents with left sided weakness and slurred speech. CTH negative for acute abnormality. MRI with punctuate focus of acute/early subacute ischemia at the lateral margin of the left basal ganglia. PMH: hypertension, hyperlipidemia, diabetes, chronic kidney disease, prior strokes, history of seizures possibly secondary to PRES (2019) versus underlying epilepsy    PT Comments    Initially, the goal of this session was to further promote gaze fixation during mobility and focus on gait training. Upon arrival, pt was visibly upset and crying, reporting frustration in regards to her current medical situation. Consoled pt and pt agreeable to participate in PT session once RN completed administering her meds to reduce her dizziness. As the RN was leaving, pt suddenly went from talking clearly and fluently while long sitting in bed, unsupported without UE support or LOB while maintaining midline to pt leaning over the L bedrail with eyes open, gaze anterior and low volume, slurred speech. RN immediately notified and assessed pt and her vitals, see below. Pt able to smile and stick out tongue without any appreciative one sided weakness, but did display weakness in her L extremities, which has been present since admission. Pt continued to display a L lateral lean when sitting EOB, needing modA to prevent LOB, but was responding more clearly but with a delay as the session progressed. Pt stood to take steps towards [facility] to return to supine for her safety, needing modAx2 for all functional mobility. By the end of the session, she continued to display progress in her speech clarity and response speed, but was still delayed. She did report she did not recall the episode of decreased responsiveness described above. Will continue to follow acutely.  Current recommendations remain appropriate.   Vitals during episode of decreased responsiveness -  BP 17/96 HR 87 bpm SpO2 100% on RA Temperature 97.6    Recommendations for follow up therapy are one component of a multi-disciplinary discharge planning process, led by the attending physician.  Recommendations may be updated based on patient status, additional functional criteria and insurance authorization.  Follow Up Recommendations  Acute inpatient rehab (3hours/day)     Assistance Recommended at Discharge Frequent or constant Supervision/Assistance  Patient can return home with the following A lot of help with walking and/or transfers;A little help with bathing/dressing/bathroom;Assistance with cooking/housework;Assist for transportation;Help with stairs or ramp for entrance   Equipment Recommendations  Wheelchair (measurements PT);BSC/3in1;Wheelchair cushion (measurements PT)    Recommendations for Other Services       Precautions / Restrictions Precautions Precautions: Fall Restrictions Weight Bearing Restrictions: No     Mobility  Bed Mobility Overal bed mobility: Needs Assistance Bed Mobility: Supine to Sit, Sit to Supine     Supine to sit: Mod assist Sit to supine: HOB elevated, Mod assist, +2 for physical assistance, +2 for safety/equipment   General bed mobility comments: Pt initially sitting upright in bed in long sitting without LOB while moving her arms around. When pt had her bout of decreased responsiveness, she leaned to her L against the L bedrail and needed modA to eventually ascend trunk again. ModAx2 to manage her legs and trunk to return to supine in bed, needing cues to prop up on her R elbow to descend her trunk    Transfers Overall transfer level: Needs assistance Equipment used: 2 person hand held assist Transfers: Sit to/from  Stand Sit to Stand: Mod assist, +2 physical assistance, +2 safety/equipment           General transfer comment:  Pt cued to grab arms of therapists with face-to-face approach to stand from EOB, modAx2 for stability with transfer and stepping to the R to [facility] with noted LOB and modAx2 to recover.    Ambulation/Gait Ambulation/Gait assistance: Mod assist, +2 physical assistance, +2 safety/equipment Gait Distance (Feet): 2 Feet Assistive device: 2 person hand held assist Gait Pattern/deviations: Step-through pattern, Decreased step length - right, Decreased step length - left, Decreased stride length, Decreased weight shift to right, Ataxic, Narrow base of support, Trunk flexed, Knees buckling Gait velocity: reduced Gait velocity interpretation: <1.31 ft/sec, indicative of household ambulator   General Gait Details: Pt only taking a few steps to the R towards [facility] with bil HHA today for pt safety as pt had a change in presentation during session. ModAx2 for stability, to recover when pt's knees buckled, and to direct pt to the R.   Stairs             Wheelchair Mobility    Modified Rankin (Stroke Patients Only) Modified Rankin (Stroke Patients Only) Pre-Morbid Rankin Score: No significant disability Modified Rankin: Moderately severe disability     Balance Overall balance assessment: Needs assistance Sitting-balance support: Feet supported, No upper extremity supported, Single extremity supported, Feet unsupported Sitting balance-Leahy Scale: Poor Sitting balance - Comments: Initially good sitting balance with pt long sitting without UE support and no LOB, maintaining midline. When pt became less responsive she began to lean to the L and maintained a L lateral lean long sitting in bed and sitting EOB, needing verbal and tactile cues and modA to correct with momentary success. Cued pt to prop up on R elbow sitting EOB to reduce her R push (reported she felt like she was leaning to the R), but pt still leaning to the L. Postural control: Left lateral lean Standing balance support: Bilateral upper  extremity supported, During functional activity Standing balance-Leahy Scale: Poor Standing balance comment: reliant on bilateral UE support and external physical assistance                            Cognition Arousal/Alertness: Awake/alert Behavior During Therapy: WFL for tasks assessed/performed Overall Cognitive Status: Impaired/Different from baseline                                 General Comments: Pt initially alert, sitting upright in bed, talking fluently and expressing her frustration with her current medical situation and crying. Consoled pt and RN administered meds for her dizziness with pt reporting desire to participate with PT after meds were provided. After meds provided via IV and RN was leaving room, pt became less alert, maintaining a forward gaze with eyes open and noted nystagmus. Pt leaned to her L onto the bedrail and responding with slurred, low volume speech. RN notified immediately and came to assess pt and her vitals. Pt eventually became more alert again, but maintained a  L lateral lean and was slower to process and respond to cues with poor awareness of her lean, reporting she felt she was leaning to the R. More fluent speech and more alert by end of session with pt supine but trunk elevated by HOB being elevated. Pt denying recalling the bout of decreased responsiveness earlier in the session.  Exercises      General Comments General comments (skin integrity, edema, etc.): Vitals during episode of decreased responsiveness - BP 17/96, HR 87 bpm, SpO2 100% on RA, temperature 97.6      Pertinent Vitals/Pain Pain Assessment Pain Assessment: Faces Faces Pain Scale: Hurts little more Pain Location: dizziness Pain Descriptors / Indicators: Discomfort Pain Intervention(s): Limited activity within patient's tolerance, Monitored during session, Repositioned, Other (comment) (RN gave meds for dizziness during session)    Home Living                           Prior Function            PT Goals (current goals can now be found in the care plan section) Acute Rehab PT Goals Patient Stated Goal: for dizziness to improve PT Goal Formulation: With patient Time For Goal Achievement: 09/06/22 Potential to Achieve Goals: Good Progress towards PT goals: Not progressing toward goals - comment (status change during session)    Frequency    Min 4X/week      PT Plan Current plan remains appropriate    Co-evaluation              AM-PAC PT "6 Clicks" Mobility   Outcome Measure  Help needed turning from your back to your side while in a flat bed without using bedrails?: A Little Help needed moving from lying on your back to sitting on the side of a flat bed without using bedrails?: Total Help needed moving to and from a bed to a chair (including a wheelchair)?: Total Help needed standing up from a chair using your arms (e.g., wheelchair or bedside chair)?: Total Help needed to walk in hospital room?: Total Help needed climbing 3-5 steps with a railing? : Total 6 Click Score: 8    End of Session Equipment Utilized During Treatment: Gait belt Activity Tolerance: Other (comment) (limited by changes in status during session) Patient left: in bed;with call bell/phone within reach;Other (comment) (with OT) Nurse Communication: Mobility status;Other (comment) (change in status, vitals, RN assessed pt during episode of decreased response) PT Visit Diagnosis: Unsteadiness on feet (R26.81);Ataxic gait (R26.0);Difficulty in walking, not elsewhere classified (R26.2);Dizziness and giddiness (R42);Other abnormalities of gait and mobility (R26.89);Other symptoms and signs involving the nervous system (R29.898)     Time: [id number] PT Time Calculation (min) (ACUTE ONLY): 22 min  Charges:  $Therapeutic Activity: 8-22 mins                     Moishe Spice, PT, DPT Acute Rehabilitation Services  Office:  [place] 08/25/2022, 1:38 PM

## 2022-08-25 NOTE — Progress Notes (Signed)
STROKE TEAM PROGRESS NOTE   INTERVAL HISTORY No family at the bedside.  Patient is lying in bed comfortably.  She states she is doing well.  She did sign consent yesterday and is participating in the sleep smart study.  She underwent overnight NOx 3 monitor testing and tested positive for obstructive sleep apnea.  She will have the CPAP mask tolerability test tonight.  Vital signs stable.  Neurological exam unchanged Vitals:   08/24/22 1942 08/24/22 2332 08/25/22 0328 08/25/22 0725  BP: (!) 171/78 (!) 162/69 (!) 175/89 (!) 165/84  Pulse: 81 75 82 74  Resp: 16 17 17 16   Temp: 97.9 F (36.6 C) 97.6 F (36.4 C) 98.1 F (36.7 C) 98.1 F (36.7 C)  TempSrc: Oral Oral Oral Oral  SpO2: 100% 100% 100% 100%  Weight:      Height:       CBC:  Recent Labs  Lab 08/22/22 1644 08/22/22 1652  WBC 5.5  --   NEUTROABS 3.3  --   HGB 9.5* 9.9*  HCT 29.3* 29.0*  MCV 90.7  --   PLT 252  --    Basic Metabolic Panel:  Recent Labs  Lab 08/22/22 1644 08/22/22 1652  NA 138 142  K 4.1 4.1  CL 111 110  CO2 21*  --   GLUCOSE 179* 175*  BUN 38* 36*  CREATININE 2.61* 2.90*  CALCIUM 9.1  --    Lipid Panel:  Recent Labs  Lab 08/23/22 0247  CHOL 121  TRIG 43  HDL 77  CHOLHDL 1.6  VLDL 9  LDLCALC 35   HgbA1c:  Recent Labs  Lab 08/22/22 1644  HGBA1C 6.3*   Urine Drug Screen:  Recent Labs  Lab 08/22/22 1643  LABOPIA NONE DETECTED  COCAINSCRNUR NONE DETECTED  LABBENZ NONE DETECTED  AMPHETMU NONE DETECTED  THCU NONE DETECTED  LABBARB NONE DETECTED    Alcohol Level  Recent Labs  Lab 08/22/22 1645  ETH <10    IMAGING past 24 hours No results found.  PHYSICAL EXAM  Constitutional: Appears well-developed and well-nourished.  Middle-aged African-American lady Cardiovascular: Normal rate and regular rhythm.  Respiratory: Effort normal, non-labored breathing  Neuro: Mental Status: Patient is awake, alert, oriented to person, place, month, year, and situation. Patient is  able to give a clear and coherent history. No signs of aphasia or neglect Cranial Nerves: II: Visual Fields are full. Pupils are equal, round, and reactive to light.   III,IV, VI: EOMI without ptosis or diploplia.  V: Facial sensation is symmetric to temperature VII: Facial movement is symmetric resting and smiling VIII: Hearing is intact to voice X: Palate elevates symmetrically XI: Shoulder shrug is symmetric. XII: Tongue protrudes midline without atrophy or fasciculations.  Motor: Tone is normal. Bulk is normal.  5/5 strength was present in all four extremities.  Diminished fine finger movements on the left.  Orbits right or left upper extremity. Sensory: Diminished sensation in the left lower extremity Cerebellar: Ataxia present in left upper extremity  NIH stroke scale 0.  Premorbid modified Rankin score 2   ABCD2 score 6   ASSESSMENT/PLAN [name] is a 52 y.o. female with history of CVA, PRES, anxiety/depression, seizures, CKD, TIA, on aspirin and Brilinta at home, headaches, PVD, DM, HTN who presents as a code stroke from home.  She is currently on aspirin and Brilinta and follows with [facility].  She was supposed to have an appointment today 3/13 however she has rescheduled it for the 22nd.  She does report that she is still having dizziness, but her weakness has improved.  Additionally she was seen January  Right hemispheric TIA from small vessel disease.  Silent left subcortical lacunar infarct Code Stroke CT head No acute abnormality. Multiple remote lacunar infarcts and chronic microvascular ischemic disease. CTA head & neck No LVO MRI  Punctate focus of acute/early subacute ischemia at the lateral margin of the left basal ganglia.  LDL 35 HgbA1c 5.9 Echo pending VTE prophylaxis -Lovenox    Diet   Diet regular Room service appropriate? No; Fluid consistency: Thin   aspirin 81 mg daily and Brilinta (ticagrelor) 90 mg bid prior to admission, now on  aspirin 81 mg daily.  Recommend aspirin and Brilinta for 4 weeks and then stop Brilinta and stay on aspirin alone. 2023- P2Y12- 39 Consider trial Therapy recommendations: Pending Disposition: Pending  Hypertension Home meds: Norvasc 5 mg, Coreg 12.5 mg Stable Permissive hypertension (OK if < 220/120) but gradually normalize in 5-7 days Long-term BP goal normotensive  Hyperlipidemia Home meds: Atorvastatin 80 mg, resumed in hospital LDL 35, goal < 70 Continue statin at discharge  Diabetes type II Controlled Home meds: Ozempic HgbA1c 5.9, goal < 7.0 CBGs Recent Labs    08/22/22 1646  GLUCAP 169*    SSI  Other Stroke Risk Factors Hx stroke/TIA 08/2015 CSF neg, protein 58 07/2017 MRI showed left pontine infarct, EF 65 to 70%, carotid Doppler and MRA negative.  LDL 43, A1c 12.5, patient put on DAPT and Lipitor 40. 08/2017 Loop recorder placed, no A. fib found 02/27/2019 woke up with complete vision loss, gradually vision resumed OD, still has better vision OS.  MRI at that time showed 3 mm acute to early subacute infarct in the dorsal midline of the pons negative MRI head and neck. 07/2019 loop recorder explanted 05/01/2020 MRI showed new T2 hyperintense lesion of the right frontal white matter likely reflect an area of late subacute or chronic infarction. 06/19/2020 Subcentimeter likely subacute infarct at the left lateral aspect of the pons.  MRA head unremarkable, carotid Doppler unremarkable, EF 70 to 75%.  LDL 63, A1c 6.2.  Put on aspirin and Brilinta for 1 months and then back to aspirin and the Plavix.  Continue Lipitor 80. 11/10/2021 - MRI showed possible small right cerebral peduncle infarcts.  MRA negative.  Carotid Doppler unremarkable.  EF 60 to 65%, LDL 34, A1c 6.5, discharged with ASA and brilinta 90 twice daily.  Outpatient follow-up with [facility], continued on aspirin and Brilinta 60 twice daily. 06/20/2022- TIA with confusion, EEG negative, negative for stroke  Other  Active Problems GERD-continue PPI Depression-continue Cymbalta  Hospital day # 2  Patient is neurologically stable.  She did sign consent to participate in the sleep smart study and underwent NOx 3 monitor overnight and tested positive for obstructive sleep apnea.  She will undergo CPAP mask tolerability test tonight.  Continue ongoing physical occupational therapy and transfer to inpatient rehab when bed available.  Discussed with patient and Dr. Maryland Pink . Greater than 50% time during this 35-minute visit was spent in counseling and coordination of care about her TIA and lacunar silent infarct discussion about evaluation and prevention and treatment and answering questions. Antony Contras, MD Medical Director [facility] Pager: [phone] 08/25/2022 10:53 AM ' To contact Stroke Continuity provider, please refer to [email or web address] After hours, contact General Neurology

## 2022-08-25 NOTE — Progress Notes (Signed)
Inpatient Rehabilitation Admissions Coordinator   Insurance has approved CIR admit. I met with patient at bedside and spoke to her youngest daughter by phone and they are aware. Family is arranging 24/7 care for her after CIR admit. CIR bed will be available to admit her to on Saturday. Dr Tressa Busman, Rehab MD, will see her in the am. [place] Nurse can contact rehab at 12 noon at [phone] to give report and to clarify when bed will be available to admit her to on Saturday. Acute team and TOC made aware.  Danne Maxson, RN, MSN Rehab Admissions Coordinator [phone] 08/25/2022 11:41 AM

## 2022-08-25 NOTE — PMR Pre-admission (Signed)
PMR Admission Coordinator Pre-Admission Assessment  Patient: Christina Orozco is an 52 y.o., female MRN: [id number] DOB: [birth year] Height: 5\' 2"  (157.5 cm) Weight: 64.4 kg             Insurance Information HMO: yes    PPO:      PCP:      IPA:      80/20:      OTHER:  PRIMARY: [place] [employer] Dual complete      Policy#: [id number]      Subscriber: pt Medicare #([id number]) CM Name: Ubaldo Glassing      Phone#: [phone] option #7     Fax#: [phone] Pre-Cert#: [id number] approved 08/25/22 until 08/31/22      Employer:  Benefits:  Phone #: [phone]     Name: 3/14 Eff. Date: 06/12/22     Deduct: $240      Out of Pocket Max: [phone]      Life Max: none  CIR: $1730 co pay per admission      SNF: no co pay per day days 1 until 20; $204 co pay per day days 21 until 100 Outpatient: 80%     Co-Pay: 20% Home Health: 100%      Co-Pay: visits per medical neccesity DME: 80%     Co-Pay: 20% Providers: in network  SECONDARY: Medicaid [place] Access      Policy#: [id number] k [id number] active per passport one source [place] Counselor:       Phone#:   The "Data Collection Information Summary" for patients in Inpatient Rehabilitation Facilities with attached "Privacy Act [place] Records" was provided and verbally reviewed with: Patient  Emergency Contact Information Contact Information     Name Relation Home Work Mobile   [place],[name] Brother [phone]     GIll,Anitraous Daughter [phone]  [phone]      Current Medical History  Patient Admitting Diagnosis: CVA  History of Present Illness:  52 y.o. female with a history of stage IV kidney disease, peripheral vascular disease and diabetes as well as previous hospitalizations for stroke, TIA, and seizures.  She has had multiple episodes of TIA's and strokes over the last 10 years with extensive workup.  She was on Brilinta and aspirin prior to admission.  She presented to [facility] emergency department  on 08/22/2022 with complaints of dizziness.  While in the emergency room she developed left-sided weakness as well.    Workup included an MRI which noted punctate areas of acute/early subacute ischemia of the lateral margin of the left basal ganglia.  Neurology recommends aspirin and Brilinta for 4 weeks followed by aspirin alone as her risk factors seem adequately controlled.  She is being considered for the sleep smart study given she is at risk for sleep apnea.    She developed acute vertigo with horizontal nystagmus/nausea and vomiting. No improvement with Antivert  or Reglan. Did respond with Valium,. Holding Coreg and amlodipine to allow for permissive HTN. Baseline Creatinine appears to be around 2 to 3. Continue Duloxetine and Trazodone for generalized anxiety disorder. Hgb A1c 6.3 with longterm use of insulin.    Complete NIHSS TOTAL: 1 Glasgow Coma Scale Score: 15  Patient's medical record from [facility] has been reviewed by the rehabilitation admission coordinator and physician.  Past Medical History  Past Medical History:  Diagnosis Date   Anemia    severe   Anxiety    Asthma    CKD (chronic kidney disease)    stage IV  DDD (degenerative disc disease), cervical    Depression    Diabetes mellitus ([place])    Dislocated shoulder    right   GERD (gastroesophageal reflux disease)    Headache    migraines (about once a month)   History of kidney stones    Hypertension    Neuropathy    Peripheral vascular disease ([place])    Pneumonia    Stroke [facility])    Has the patient had major surgery during 100 days prior to admission? No  Family History  family history includes Breast cancer (age of onset: 61) in her sister; CAD in her mother; Cancer in an other family member; Diabetes in an other family member; Heart disease in an other family member; Heart failure in her mother; Seizures in her maternal uncle; Vascular Disease in her mother.  Current Medications   Current  Facility-Administered Medications:    acetaminophen (TYLENOL) tablet 650 mg, 650 mg, Oral, Q4H PRN, 650 mg at 08/24/22 1646 **OR** acetaminophen (TYLENOL) 160 MG/5ML solution 650 mg, 650 mg, Per Tube, Q4H PRN **OR** acetaminophen (TYLENOL) suppository 650 mg, 650 mg, Rectal, Q4H PRN, Tu, Ching T, DO   aspirin EC tablet 81 mg, 81 mg, Oral, Daily, Tu, Ching T, DO, 81 mg at 08/25/22 1003   atorvastatin (LIPITOR) tablet 80 mg, 80 mg, Oral, QHS, Tu, Ching T, DO, 80 mg at 08/24/22 2119   diazepam (VALIUM) injection 5 mg, 5 mg, Intravenous, Q6H PRN, Annita Brod, MD   ondansetron [facility]) injection 4 mg, 4 mg, Intravenous, Q6H PRN, Tu, Ching T, DO, 4 mg at 08/24/22 1646   ticagrelor (BRILINTA) tablet 90 mg, 90 mg, Oral, BID, Tu, Ching T, DO, 90 mg at 08/25/22 1003  Patients Current Diet:  Diet Order             Diet regular Room service appropriate? No; Fluid consistency: Thin  Diet effective now                  Precautions / Restrictions Precautions Precautions: Fall Restrictions Weight Bearing Restrictions: No   Has the patient had 2 or more falls or a fall with injury in the past year?Yes  Prior Activity Level Limited Community (1-2x/wk): uses cane, limited community  Prior Functional Level Prior Function Prior Level of Function : Needs assist Mobility Comments: Uses cane, limited community ambulator. received HHOT and HHRN ADLs Comments: able to perform ADLs and light IADLs  Self Care: Did the patient need help bathing, dressing, using the toilet or eating?  Independent  Indoor Mobility: Did the patient need assistance with walking from room to room (with or without device)? Independent  Stairs: Did the patient need assistance with internal or external stairs (with or without device)? Needed some help  Functional Cognition: Did the patient need help planning regular tasks such as shopping or remembering to take medications? Needed some help  Patient Information Are  you of Hispanic, Latino/a,or Spanish origin?: A. No, not of Hispanic, Latino/a, or Spanish origin What is your race?: B. Black or African American Do you need or want an interpreter to communicate with a doctor or health care staff?: 0. No  Patient's Response To:  Health Literacy and Transportation Is the patient able to respond to health literacy and transportation needs?: Yes Health Literacy - How often do you need to have someone help you when you read instructions, pamphlets, or other written material from your doctor or pharmacy?: Sometimes In the past 12 months, has lack of  transportation kept you from medical appointments or from getting medications?: No In the past 12 months, has lack of transportation kept you from meetings, work, or from getting things needed for daily living?: No  [place] / [place] Devices/Equipment: Radio producer (specify quad or straight), Walker (specify type) Home Equipment: Conservation officer, nature (2 wheels), [employer] - single point, [employer], Grab bars - tub/shower, Hand held shower head, Grab bars - toilet  Prior Device Use: Indicate devices/aids used by the patient prior to current illness, exacerbation or injury? Cane and RW  Current Functional Level Cognition  Arousal/Alertness: Awake/alert Overall Cognitive Status: Impaired/Different from baseline Orientation Level: Oriented X4 General Comments: Patient slow to respond snd low voice following episode with PT earlier Attention: Sustained Sustained Attention: Appears intact Memory: Impaired Memory Impairment: Storage deficit, Retrieval deficit (3/5 word recall) Awareness: Appears intact Problem Solving: Appears intact Safety/Judgment: Appears intact    Extremity Assessment (includes Sensation/Coordination)  Upper Extremity Assessment: Overall WFL for tasks assessed  Lower Extremity Assessment: RLE deficits/detail, LLE deficits/detail RLE Deficits / Details: Strength 5/5 RLE  Coordination: decreased gross motor LLE Deficits / Details: Strength 5/5 LLE Coordination: decreased gross motor    ADLs  Overall ADL's : Needs assistance/impaired Eating/Feeding: Set up, Sitting Grooming: Wash/dry hands, Wash/dry face, Supervision/safety, Bed level Upper Body Bathing: Sitting, Moderate assistance Lower Body Bathing: Sitting/lateral leans, Maximal assistance Upper Body Dressing : Moderate assistance, Sitting Lower Body Dressing: Maximal assistance, Sitting/lateral leans Lower Body Dressing Details (indicate cue type and reason): max assist to donn socks while seated on EOB Toilet Transfer: Moderate assistance, +2 for safety/equipment, +2 for physical assistance Toileting- Clothing Manipulation and Hygiene: Moderate assistance Functional mobility during ADLs: Moderate assistance, +2 for physical assistance, Minimal assistance General ADL Comments: Patient limited due to dizziness    Mobility  Overal bed mobility: Needs Assistance Bed Mobility: Supine to Sit, Sit to Supine Supine to sit: Mod assist Sit to supine: HOB elevated, Mod assist, +2 for physical assistance, +2 for safety/equipment General bed mobility comments: mod assist x2 to manage BLEs and trunk due to Left lateral leaning    Transfers  Overall transfer level: Needs assistance Equipment used: 2 person hand held assist Transfers: Sit to/from Stand Sit to Stand: Mod assist, +2 physical assistance, +2 safety/equipment General transfer comment: Pt cued to grab arms of therapists with face-to-face approach to stand from EOB, modAx2 for stability with transfer and stepping to the R to [facility] with noted LOB and modAx2 to recover.    Ambulation / Gait / Stairs / Wheelchair Mobility  Ambulation/Gait Ambulation/Gait assistance: Mod assist, +2 physical assistance, +2 safety/equipment Gait Distance (Feet): 2 Feet Assistive device: 2 person hand held assist Gait Pattern/deviations: Step-through pattern, Decreased step  length - right, Decreased step length - left, Decreased stride length, Decreased weight shift to right, Ataxic, Narrow base of support, Trunk flexed, Knees buckling General Gait Details: Pt only taking a few steps to the R towards [facility] with bil HHA today for pt safety as pt had a change in presentation during session. ModAx2 for stability, to recover when pt's knees buckled, and to direct pt to the R. Gait velocity: reduced Gait velocity interpretation: <1.31 ft/sec, indicative of household ambulator    Posture / Balance Dynamic Sitting Balance Sitting balance - Comments: left lateral leaning while on EOB and able to correct with verbal and physical cues but return to lateral leaning. Lateral leaning on right elbow to address Balance Overall balance assessment: Needs assistance Sitting-balance support: Feet  supported, No upper extremity supported, Single extremity supported, Feet unsupported Sitting balance-Leahy Scale: Poor Sitting balance - Comments: left lateral leaning while on EOB and able to correct with verbal and physical cues but return to lateral leaning. Lateral leaning on right elbow to address Postural control: Left lateral lean Standing balance support: Bilateral upper extremity supported, During functional activity Standing balance-Leahy Scale: Poor Standing balance comment: reliant on bilateral UE support and external physical assistance    Special needs/care consideration Fall precautions Hgb A1c 6.3 Smart Sleep study SCAT and Aide form completed and sent by [facility] on 3/15   Previous Home Environment  Living Arrangements: Children (72 yo dtr lives with her; she goes to [employer] state)  Lives With: Daughter Available Help at Discharge: Family, Available 24 hours/day Type of Home: Apartment Home Layout: One level Home Access: Level entry Bathroom Shower/Tub: Tub/shower unit, Architectural technologist: Handicapped height Bathroom Accessibility: Yes Home Care Services:  Yes Type of Home Care Services: Home OT, [place] (if known): [facility] Additional Comments: trying to get a PCA back  Discharge Living Setting Plans for Discharge Living Setting: Apartment, Lives with (comment) (72 year old daughter) Type of Home at Discharge: Apartment Discharge Home Layout: One level Discharge Home Access: Level entry Discharge Bathroom Shower/Tub: Tub/shower unit Discharge Bathroom Toilet: Handicapped height Discharge Bathroom Accessibility: Yes How Accessible: Accessible via walker Does the patient have any problems obtaining your medications?: No  Social/Family/Support Systems Patient Roles: Parent Contact Information: brother and daughters Anticipated Caregiver: daughters and brother Anticipated Caregiver's Contact Information: see contacts Ability/Limitations of Caregiver: 72 year old daughter onsite at [employer] state on Wednesdays, older dtr works Banker at [employer], brother asissting in arranging 24/7 assist Caregiver Availability: 24/7 Discharge Plan Discussed with Primary Caregiver: Yes Is Caregiver In Agreement with Plan?: Yes Does Caregiver/Family have Issues with Lodging/Transportation while Pt is in Rehab?: No  Acute therapy evals mentions spouse/significant other; I clarified that her former partner of 26 years moved out in 2020.   Goals Patient/Family Goal for Rehab: Mod I to intermittent supervision with PT, OT and SLP at wheelchair level Expected length of stay: ELOS 12- 18 days Additional Information: Has been to CIR previously Pt/Family Agrees to Admission and willing to participate: Yes Program Orientation Provided & Reviewed with Pt/Caregiver Including Roles  & Responsibilities: Yes Additional Information Needs: Has seen Dr Letta Pate in outpatient clinic  Barriers to Discharge: Decreased caregiver support  Decrease burden of Care through IP rehab admission: n/a  Possible need for SNF placement upon  discharge:patient states she will not go SNF  Patient Condition: This patient's medical and functional status has changed since the consult dated: 08/24/2022 in which the Rehabilitation Physician determined and documented that the patient's condition is appropriate for intensive rehabilitative care in an inpatient rehabilitation facility. See "History of Present Illness" (above) for medical update. Functional changes are: overall mod assist. Patient's medical and functional status update has been discussed with the Rehabilitation physician and patient remains appropriate for inpatient rehabilitation. Will admit to inpatient rehab on 08/26/22 when bed is available.  Preadmission Screen Completed By:  Cleatrice Burke, RN, MSN 08/25/2022 2:51 PM ______________________________________________________________________   Discussed status with Dr. Tressa Busman on 08/25/22 at 1215 and received approval for admission 08/26/22 when bed is available.  Admission Coordinator:  Cleatrice Burke, time [id number] Date 08/25/2022

## 2022-08-25 NOTE — Progress Notes (Cosign Needed)
CM sent SCAT application to [facility] with SCAT Will send aide form in on Monday, have to send after d/c.

## 2022-08-26 ENCOUNTER — Encounter (HOSPITAL_COMMUNITY): Payer: Self-pay | Admitting: Physical Medicine & Rehabilitation

## 2022-08-26 ENCOUNTER — Other Ambulatory Visit: Payer: Self-pay

## 2022-08-26 ENCOUNTER — Inpatient Hospital Stay (HOSPITAL_COMMUNITY)
Admission: RE | Admit: 2022-08-26 | Discharge: 2022-09-05 | DRG: 057 | Disposition: A | Discharge status: Home Health | Payer: 59 | Source: Intra-hospital | Attending: Physical Medicine & Rehabilitation | Admitting: Physical Medicine & Rehabilitation

## 2022-08-26 DIAGNOSIS — E1151 Type 2 diabetes mellitus with diabetic peripheral angiopathy without gangrene: Secondary | ICD-10-CM | POA: Diagnosis present

## 2022-08-26 DIAGNOSIS — I69320 Aphasia following cerebral infarction: Secondary | ICD-10-CM

## 2022-08-26 DIAGNOSIS — E785 Hyperlipidemia, unspecified: Secondary | ICD-10-CM | POA: Diagnosis present

## 2022-08-26 DIAGNOSIS — G4733 Obstructive sleep apnea (adult) (pediatric): Secondary | ICD-10-CM | POA: Diagnosis present

## 2022-08-26 DIAGNOSIS — R42 Dizziness and giddiness: Secondary | ICD-10-CM | POA: Diagnosis present

## 2022-08-26 DIAGNOSIS — I69393 Ataxia following cerebral infarction: Secondary | ICD-10-CM

## 2022-08-26 DIAGNOSIS — Z833 Family history of diabetes mellitus: Secondary | ICD-10-CM | POA: Diagnosis not present

## 2022-08-26 DIAGNOSIS — F32A Depression, unspecified: Secondary | ICD-10-CM | POA: Diagnosis present

## 2022-08-26 DIAGNOSIS — E1136 Type 2 diabetes mellitus with diabetic cataract: Secondary | ICD-10-CM | POA: Diagnosis present

## 2022-08-26 DIAGNOSIS — Z7982 Long term (current) use of aspirin: Secondary | ICD-10-CM | POA: Diagnosis not present

## 2022-08-26 DIAGNOSIS — Z87442 Personal history of urinary calculi: Secondary | ICD-10-CM | POA: Diagnosis not present

## 2022-08-26 DIAGNOSIS — D631 Anemia in chronic kidney disease: Secondary | ICD-10-CM | POA: Diagnosis present

## 2022-08-26 DIAGNOSIS — Z803 Family history of malignant neoplasm of breast: Secondary | ICD-10-CM

## 2022-08-26 DIAGNOSIS — E1122 Type 2 diabetes mellitus with diabetic chronic kidney disease: Secondary | ICD-10-CM | POA: Diagnosis present

## 2022-08-26 DIAGNOSIS — I129 Hypertensive chronic kidney disease with stage 1 through stage 4 chronic kidney disease, or unspecified chronic kidney disease: Secondary | ICD-10-CM | POA: Diagnosis present

## 2022-08-26 DIAGNOSIS — I69322 Dysarthria following cerebral infarction: Secondary | ICD-10-CM | POA: Diagnosis not present

## 2022-08-26 DIAGNOSIS — K219 Gastro-esophageal reflux disease without esophagitis: Secondary | ICD-10-CM | POA: Diagnosis present

## 2022-08-26 DIAGNOSIS — Z79899 Other long term (current) drug therapy: Secondary | ICD-10-CM

## 2022-08-26 DIAGNOSIS — Z8249 Family history of ischemic heart disease and other diseases of the circulatory system: Secondary | ICD-10-CM | POA: Diagnosis not present

## 2022-08-26 DIAGNOSIS — I69354 Hemiplegia and hemiparesis following cerebral infarction affecting left non-dominant side: Principal | ICD-10-CM

## 2022-08-26 DIAGNOSIS — F331 Major depressive disorder, recurrent, moderate: Secondary | ICD-10-CM | POA: Diagnosis not present

## 2022-08-26 DIAGNOSIS — I10 Essential (primary) hypertension: Secondary | ICD-10-CM | POA: Diagnosis not present

## 2022-08-26 DIAGNOSIS — I639 Cerebral infarction, unspecified: Secondary | ICD-10-CM | POA: Diagnosis not present

## 2022-08-26 DIAGNOSIS — E1142 Type 2 diabetes mellitus with diabetic polyneuropathy: Secondary | ICD-10-CM | POA: Diagnosis present

## 2022-08-26 DIAGNOSIS — J45909 Unspecified asthma, uncomplicated: Secondary | ICD-10-CM | POA: Diagnosis present

## 2022-08-26 DIAGNOSIS — N184 Chronic kidney disease, stage 4 (severe): Secondary | ICD-10-CM | POA: Diagnosis present

## 2022-08-26 DIAGNOSIS — F411 Generalized anxiety disorder: Secondary | ICD-10-CM | POA: Diagnosis present

## 2022-08-26 DIAGNOSIS — I6302 Cerebral infarction due to thrombosis of basilar artery: Secondary | ICD-10-CM | POA: Diagnosis not present

## 2022-08-26 DIAGNOSIS — I63412 Cerebral infarction due to embolism of left middle cerebral artery: Secondary | ICD-10-CM | POA: Diagnosis not present

## 2022-08-26 DIAGNOSIS — E1169 Type 2 diabetes mellitus with other specified complication: Secondary | ICD-10-CM | POA: Diagnosis not present

## 2022-08-26 LAB — CBC WITH DIFFERENTIAL/PLATELET
Abs Immature Granulocytes: 0.05 10*3/uL (ref 0.00–0.07)
Basophils Absolute: 0 10*3/uL (ref 0.0–0.1)
Basophils Relative: 1 %
Eosinophils Absolute: 0.1 10*3/uL (ref 0.0–0.5)
Eosinophils Relative: 2 %
HCT: 30.3 % — ABNORMAL LOW (ref 36.0–46.0)
Hemoglobin: 9.9 g/dL — ABNORMAL LOW (ref 12.0–15.0)
Immature Granulocytes: 1 %
Lymphocytes Relative: 35 %
Lymphs Abs: 2.2 10*3/uL (ref 0.7–4.0)
MCH: 29.1 pg (ref 26.0–34.0)
MCHC: 32.7 g/dL (ref 30.0–36.0)
MCV: 89.1 fL (ref 80.0–100.0)
Monocytes Absolute: 0.5 10*3/uL (ref 0.1–1.0)
Monocytes Relative: 7 %
Neutro Abs: 3.5 10*3/uL (ref 1.7–7.7)
Neutrophils Relative %: 54 %
Platelets: 222 10*3/uL (ref 150–400)
RBC: 3.4 MIL/uL — ABNORMAL LOW (ref 3.87–5.11)
RDW: 14.3 % (ref 11.5–15.5)
WBC: 6.4 10*3/uL (ref 4.0–10.5)
nRBC: 0 % (ref 0.0–0.2)

## 2022-08-26 LAB — COMPREHENSIVE METABOLIC PANEL
ALT: 15 U/L (ref 0–44)
AST: 20 U/L (ref 15–41)
Albumin: 3.1 g/dL — ABNORMAL LOW (ref 3.5–5.0)
Alkaline Phosphatase: 43 U/L (ref 38–126)
Anion gap: 7 (ref 5–15)
BUN: 42 mg/dL — ABNORMAL HIGH (ref 6–20)
CO2: 23 mmol/L (ref 22–32)
Calcium: 9.2 mg/dL (ref 8.9–10.3)
Chloride: 111 mmol/L (ref 98–111)
Creatinine, Ser: 3.06 mg/dL — ABNORMAL HIGH (ref 0.44–1.00)
GFR, Estimated: 18 mL/min — ABNORMAL LOW (ref >60)
Glucose, Bld: 89 mg/dL (ref 70–99)
Potassium: 4.1 mmol/L (ref 3.5–5.1)
Sodium: 141 mmol/L (ref 135–145)
Total Bilirubin: 0.3 mg/dL (ref 0.3–1.2)
Total Protein: 6.1 g/dL — ABNORMAL LOW (ref 6.5–8.1)

## 2022-08-26 MED ORDER — DIPHENHYDRAMINE HCL 12.5 MG/5ML PO ELIX: 12.5000 mg | ORAL_SOLUTION | Freq: Four times a day (QID) | ORAL | Status: DC | PRN

## 2022-08-26 MED ORDER — PROCHLORPERAZINE EDISYLATE 10 MG/2ML IJ SOLN: 5.0000 mg | Freq: Four times a day (QID) | INTRAMUSCULAR | Status: DC | PRN

## 2022-08-26 MED ORDER — PROCHLORPERAZINE MALEATE 5 MG PO TABS
5.0000 mg | ORAL_TABLET | Freq: Four times a day (QID) | ORAL | Status: DC | PRN
  Administered 2022-08-27: 10 mg via ORAL
  Filled 2022-08-26: qty 2

## 2022-08-26 MED ORDER — HYDRALAZINE HCL 10 MG PO TABS
10.0000 mg | ORAL_TABLET | Freq: Four times a day (QID) | ORAL | Status: DC | PRN
  Administered 2022-08-26: 10 mg via ORAL
  Filled 2022-08-26: qty 1

## 2022-08-26 MED ORDER — DIAZEPAM 5 MG/ML IJ SOLN
5.0000 mg | Freq: Four times a day (QID) | INTRAMUSCULAR | Status: DC | PRN
  Administered 2022-08-26: 5 mg via INTRAVENOUS
  Filled 2022-08-26 (×2): qty 2

## 2022-08-26 MED ORDER — DIAZEPAM 2 MG PO TABS: 5.0000 mg | ORAL_TABLET | Freq: Four times a day (QID) | ORAL | Status: DC | PRN

## 2022-08-26 MED ORDER — DIAZEPAM 5 MG/ML IJ SOLN: 2.5000 mg | Freq: Four times a day (QID) | INTRAMUSCULAR | Status: DC | PRN

## 2022-08-26 MED ORDER — DIAZEPAM 2 MG PO TABS
2.0000 mg | ORAL_TABLET | Freq: Four times a day (QID) | ORAL | Status: DC | PRN
  Filled 2022-08-26: qty 1

## 2022-08-26 MED ORDER — HYDRALAZINE HCL 25 MG PO TABS
25.0000 mg | ORAL_TABLET | Freq: Four times a day (QID) | ORAL | Status: DC | PRN
  Administered 2022-08-26 – 2022-08-31 (×3): 25 mg via ORAL
  Filled 2022-08-26 (×3): qty 1

## 2022-08-26 MED ORDER — ATORVASTATIN CALCIUM 80 MG PO TABS
80.0000 mg | ORAL_TABLET | Freq: Every day | ORAL | Status: DC
  Administered 2022-08-26 – 2022-09-04 (×10): 80 mg via ORAL
  Filled 2022-08-26 (×10): qty 1

## 2022-08-26 MED ORDER — TICAGRELOR 90 MG PO TABS
90.0000 mg | ORAL_TABLET | Freq: Two times a day (BID) | ORAL | Status: DC
  Administered 2022-08-26 – 2022-09-05 (×20): 90 mg via ORAL
  Filled 2022-08-26 (×20): qty 1

## 2022-08-26 MED ORDER — DIAZEPAM 2 MG PO TABS
5.0000 mg | ORAL_TABLET | Freq: Three times a day (TID) | ORAL | Status: DC | PRN
  Administered 2022-08-27: 5 mg via ORAL
  Filled 2022-08-26: qty 3

## 2022-08-26 MED ORDER — ACETAMINOPHEN 325 MG PO TABS
325.0000 mg | ORAL_TABLET | ORAL | Status: DC | PRN
  Administered 2022-08-30 – 2022-09-04 (×5): 650 mg via ORAL
  Filled 2022-08-26 (×5): qty 2

## 2022-08-26 MED ORDER — TRAZODONE HCL 50 MG PO TABS: 25.0000 mg | ORAL_TABLET | Freq: Every evening | ORAL | Status: DC | PRN

## 2022-08-26 MED ORDER — ASPIRIN 81 MG PO TBEC
81.0000 mg | DELAYED_RELEASE_TABLET | Freq: Every day | ORAL | Status: DC
  Administered 2022-08-27 – 2022-09-05 (×10): 81 mg via ORAL
  Filled 2022-08-26 (×10): qty 1

## 2022-08-26 MED ORDER — ENOXAPARIN SODIUM 30 MG/0.3ML IJ SOSY
30.0000 mg | PREFILLED_SYRINGE | INTRAMUSCULAR | Status: DC
  Administered 2022-08-26 – 2022-09-04 (×10): 30 mg via SUBCUTANEOUS
  Filled 2022-08-26 (×10): qty 0.3

## 2022-08-26 MED ORDER — POLYETHYLENE GLYCOL 3350 17 G PO PACK
17.0000 g | PACK | Freq: Every day | ORAL | Status: DC | PRN
  Administered 2022-08-28 – 2022-09-03 (×2): 17 g via ORAL
  Filled 2022-08-26: qty 1

## 2022-08-26 MED ORDER — METHOCARBAMOL 500 MG PO TABS: 500.0000 mg | ORAL_TABLET | Freq: Four times a day (QID) | ORAL | Status: DC | PRN

## 2022-08-26 MED ORDER — DIAZEPAM 5 MG/ML IJ SOLN: 2.5000 mg | Freq: Three times a day (TID) | INTRAMUSCULAR | Status: DC | PRN

## 2022-08-26 MED ORDER — PROCHLORPERAZINE 25 MG RE SUPP: 12.5000 mg | Freq: Four times a day (QID) | RECTAL | Status: DC | PRN

## 2022-08-26 MED ORDER — GUAIFENESIN-DM 100-10 MG/5ML PO SYRP: 5.0000 mL | ORAL_SOLUTION | Freq: Four times a day (QID) | ORAL | Status: DC | PRN

## 2022-08-26 MED ORDER — HYDRALAZINE HCL 10 MG PO TABS: 10.0000 mg | ORAL_TABLET | Freq: Four times a day (QID) | ORAL | Status: DC | PRN

## 2022-08-26 MED ORDER — FLEET ENEMA 7-19 GM/118ML RE ENEM: 1.0000 | ENEMA | Freq: Once | RECTAL | Status: DC | PRN

## 2022-08-26 MED ORDER — SORBITOL 70 % SOLN
30.0000 mL | Freq: Every day | Status: DC | PRN
  Administered 2022-08-28 – 2022-09-02 (×3): 30 mL via ORAL
  Filled 2022-08-26 (×4): qty 30

## 2022-08-26 NOTE — Progress Notes (Signed)
Inpatient Rehabilitation Admission Medication Review by a Pharmacist  A complete drug regimen review was completed for this patient to identify any potential clinically significant medication issues.  High Risk Drug Classes Is patient taking? Indication by Medication  Antipsychotic Yes Compazine - nausea  Anticoagulant Yes Lovenox - dvt ppx  Antibiotic No   Opioid No   Antiplatelet Yes Ticagrelor/ASA - stroke ppx  Hypoglycemics/insulin No   Vasoactive Medication Yes Hydralazine - HTN  Chemotherapy No   Other Yes Valium - vertigo Duloxetine, trazodone - anxiety Robaxin - spasms Trazodone - sleep Atorvastatin - HLD     Type of Medication Issue Identified Description of Issue Recommendation(s)  Drug Interaction(s) (clinically significant)     Duplicate Therapy     Allergy     No Medication Administration End Date     Incorrect Dose     Additional Drug Therapy Needed     Significant med changes from prior encounter (inform family/care partners about these prior to discharge).    Other  Holding home carvedilol and amlodipine to allow for permissive HTN  PTA meds not restarted on CIR admit: montelukast, pantoprazole, Ozempic (semaglutide)  Restart home medications during CIR stay or on discharge, as clinically appropriate    Clinically significant medication issues were identified that warrant physician communication and completion of prescribed/recommended actions by midnight of the next day:  No  Time spent performing this drug regimen review (minutes):  20  Louanne Belton, PharmD PGY1 Pharmacy Resident 08/26/2022 2:35 PM

## 2022-08-26 NOTE — Discharge Summary (Signed)
Physician Discharge Summary   Patient: Christina Orozco MRN: [id number] DOB: [birth year]  Admit date:     08/22/2022  Discharge date: 08/26/22  Discharge Physician: Christina Orozco   PCP: Christina Lucks, PA-C   Recommendations at discharge:   Patient being discharged to inpatient rehab New medication while in inpatient rehab: IV Valium 2.5 mg every 6 hours as needed for dizziness.  Consideration may be for changing to p.o. and using more prophylactically.  May need to go with higher dose of 5 mg as needed.  Depending on patient's progress, may need prescription for Valium as outpatient. Patient will continue on Brilinta 90 mg twice daily and aspirin daily Continue CPAP, settings as per respiratory New medication: Hydralazine 10 mg every 6 hours as needed for systolic blood pressure greater than 180. Patient's home blood pressure medications can be resumed in the next 5 to 7 days had to decrease IV as needed dose  Discharge Diagnoses: Active Problems:   Acute CVA (cerebrovascular accident) ([place])   Vertigo   OSA (obstructive sleep apnea)   Hypertension   Chronic kidney disease (CKD), stage IV (severe) (HCC)   Generalized anxiety disorder   Type 2 diabetes mellitus with chronic kidney disease, without long-term current use of insulin (HCC)   Asthma, chronic   Overweight (BMI 25.0-29.9)   Acute stroke due to ischemia [facility])  Resolved Problems:   Acute left-sided weakness  Hospital Course: 52 year old female with past medical history of stage IV CKD, PVD, diabetes mellitus now well-controlled, hypertension and seizure with previous hospitalizations for stroke and TIA and currently on aspirin, after finishing Brilinta, who presented to the emergency room on 3/12 with complaints of dizziness when moving her head.  While in the emergency room evaluated by neurology, noted to have difficulty with dorsiflexion/plantarflexion of her left foot, which had since resolved.  Workup by  MRI noted punctate areas of acute/early subacute ischemia of the lateral margin of left basal ganglia.  Patient brought in for further evaluation.    Assessment and Plan: Acute CVA (cerebrovascular accident) [facility]) Difficult case, since patient's risk factors are already well-controlled and she has been on Brilinta and aspirin, now on aspirin.  Discussed with neurology.  They are recommending restarting Brilinta x 4 weeks.  Patient completed sleep study throughout, as below.  Accepted by inpatient rehab, bed available 3/16  OSA (obstructive sleep apnea) Tested positive with neuro trial.  Trying CPAP tonight.  Vertigo -pt developed acute vertigo with horizontal nystagmus/nausea/vomiting shortly after returning from MRI, secondary to CVA.  No improvement with Antivert or Reglan, but did well with IV Valium.  Attempted to try p.o., but to no avail.  Consider in the future when trying to go with p.o. Valium, increased dosing.  Hypertension -holding Coreg, amlodipine to allow for permissive HTN  Chronic kidney disease (CKD), stage IV (severe) ([place]) -Remains at baseline. Baseline appears to be around 2-3   Generalized anxiety disorder -Continue Duloxetine and Trazodone  Type 2 diabetes mellitus with chronic kidney disease, without long-term current use of insulin (HCC) A1C of 5/9 (06/2022) with hyperglycemia at 179 on presentation.  Continue to monitor  Asthma, chronic -Continue PRN bronchodilator  Overweight (BMI 25.0-29.9) Meets criteria with BMI greater than 25         Consultants: Neurology, physical medicine rehab Procedures performed: Echocardiogram 3/13 notes indeterminate diastolic function, preserved ejection fraction   Disposition: Inpatient rehab Diet recommendation:  Cardiac and Carb modified diet DISCHARGE MEDICATION: Allergies as of 08/26/2022  Reactions   Lisinopril Swelling, Other (See Comments)   Facial swelling  angioedema   Penicillins Hives   Has  patient had a PCN reaction causing immediate rash, facial/tongue/throat swelling, SOB or lightheadedness with hypotension: yes Has patient had a PCN reaction causing severe rash involving mucus membranes or skin necrosis: No Has patient had a PCN reaction that required hospitalization No Has patient had a PCN reaction occurring within the last 10 years: No If all of the above answers are "NO", then may proceed wit Rash on buttock that makes sitting very difficult   Quetiapine Other (See Comments)   Unknown    Gabapentin Swelling, Other (See Comments)   "I thought I was having another stroke." dizziness        Discharge Exam: Filed Weights   08/22/22 1600  Weight: 64.4 kg   General: Alert and oriented x 3, no acute distress Cardiovascular: Regular rate and rhythm, S1-S2   Condition at discharge: improving  The results of significant diagnostics from this hospitalization (including imaging, microbiology, ancillary and laboratory) are listed below for reference.   Imaging Studies: ECHOCARDIOGRAM COMPLETE  Result Date: 08/23/2022    ECHOCARDIOGRAM REPORT   Patient Name:   Christina Orozco Date of Exam: 08/23/2022 Medical Rec #:  [id number]        Height:       62.0 in Accession #:    [id number]       Weight:       142.0 lb Date of Birth:  1971-03-18       BSA:          1.653 m Patient Age:    9 years         BP:           166/75 mmHg Patient Gender: F                HR:           87 bpm. Exam Location:  Inpatient Procedure: 2D Echo Indications:    TIA  History:        Patient has prior history of Echocardiogram examinations, most                 recent 06/21/2022. Stroke; Risk Factors:Diabetes, Hypertension                 and Dyslipidemia.  Sonographer:    Christina Orozco Referring Phys: [place] T TU IMPRESSIONS  1. Left ventricular ejection fraction, by estimation, is 65 to 70%. Left ventricular ejection fraction by 3D volume is 68 %. The left ventricle has normal function. The left ventricle  has no regional wall motion abnormalities. There is moderate asymmetric left ventricular hypertrophy of the basal-septal segment (14 mm). Hypertrophied papillary muscles. Indeterminate diastolic filling due to E-A fusion.  2. Right ventricular systolic function is normal. The right ventricular size is normal.  3. A small pericardial effusion is present. The pericardial effusion is anterior to the right ventricle.  4. The mitral valve is normal in structure. Trivial mitral valve regurgitation. No evidence of mitral stenosis.  5. The aortic valve is tricuspid. Aortic valve regurgitation is not visualized. No aortic stenosis is present. Conclusion(s)/Recommendation(s): No intracardiac source of embolism detected on this transthoracic study. Consider a transesophageal echocardiogram to exclude cardiac source of embolism if clinically indicated. FINDINGS  Left Ventricle: Left ventricular ejection fraction, by estimation, is 65 to 70%. Left ventricular ejection fraction by 3D volume is 68 %. The left ventricle has normal  function. The left ventricle has no regional wall motion abnormalities. The left ventricular internal cavity size was normal in size. There is moderate asymmetric left ventricular hypertrophy of the basal-septal segment. Indeterminate diastolic filling due to E-A fusion. Right Ventricle: The right ventricular size is normal. No increase in right ventricular wall thickness. Right ventricular systolic function is normal. Left Atrium: Left atrial size was normal in size. Right Atrium: Right atrial size was normal in size. Pericardium: A small pericardial effusion is present. The pericardial effusion is anterior to the right ventricle. Mitral Valve: Chordal SAM. The mitral valve is normal in structure. Trivial mitral valve regurgitation. No evidence of mitral valve stenosis. Tricuspid Valve: The tricuspid valve is normal in structure. Tricuspid valve regurgitation is trivial. No evidence of tricuspid  stenosis. Aortic Valve: The aortic valve is tricuspid. Aortic valve regurgitation is not visualized. No aortic stenosis is present. Aortic valve mean gradient measures 3.0 mmHg. Aortic valve peak gradient measures 5.3 mmHg. Aortic valve area, by VTI measures 2.26 cm. Pulmonic Valve: The pulmonic valve was normal in structure. Pulmonic valve regurgitation is trivial. No evidence of pulmonic stenosis. Aorta: The aortic root is normal in size and structure. Venous: The inferior vena cava was not well visualized. IAS/Shunts: The interatrial septum was not well visualized.  LEFT VENTRICLE PLAX 2D LVIDd:         3.80 cm         Diastology LVIDs:         2.20 cm         LV e' medial:    5.98 cm/s LV PW:         1.10 cm         LV E/e' medial:  15.0 LV IVS:        1.00 cm         LV e' lateral:   6.42 cm/s LVOT diam:     2.00 cm         LV E/e' lateral: 14.0 LV SV:         51 LV SV Index:   31 LVOT Area:     3.14 cm        3D Volume EF                                LV 3D EF:    Left                                             ventricul LV Volumes (MOD)                            ar LV vol d, MOD    76.0 ml                    ejection A2C:                                        fraction LV vol d, MOD    110.0 ml                   by 3D A4C:  volume is LV vol s, MOD    27.0 ml                    68 %. A2C: LV vol s, MOD    48.1 ml A4C:                           3D Volume EF: LV SV MOD A2C:   49.0 ml       3D EF:        68 % LV SV MOD A4C:   110.0 ml      LV EDV:       102 ml LV SV MOD BP:    52.8 ml       LV ESV:       32 ml                                LV SV:        70 ml RIGHT VENTRICLE RV Basal diam:  2.40 cm RV Mid diam:    1.90 cm RV S prime:     16.50 cm/s TAPSE (M-mode): 2.2 cm LEFT ATRIUM             Index        RIGHT ATRIUM           Index LA diam:        3.20 cm 1.94 cm/m   RA Area:     10.90 cm LA Vol (A2C):   21.4 ml 12.95 ml/m  RA Volume:   23.10 ml  13.98 ml/m LA  Vol (A4C):   27.8 ml 16.82 ml/m LA Biplane Vol: 26.7 ml 16.16 ml/m  AORTIC VALVE                    PULMONIC VALVE AV Area (Vmax):    2.36 cm     PV Vmax:       0.93 m/s AV Area (Vmean):   2.40 cm     PV Peak grad:  3.5 mmHg AV Area (VTI):     2.26 cm AV Vmax:           115.50 cm/s AV Vmean:          77.800 cm/s AV VTI:            0.224 m AV Peak Grad:      5.3 mmHg AV Mean Grad:      3.0 mmHg LVOT Vmax:         86.77 cm/s LVOT Vmean:        59.333 cm/s LVOT VTI:          0.161 m LVOT/AV VTI ratio: 0.72  AORTA Ao Root diam: 3.10 cm MITRAL VALVE                TRICUSPID VALVE MV Area (PHT): 6.32 cm     TR Peak grad:   19.0 mmHg MV Decel Time: 120 msec     TR Vmax:        218.00 cm/s MR Peak grad: 25.4 mmHg MR Vmax:      252.00 cm/s   SHUNTS MV E velocity: 89.70 cm/s   Systemic VTI:  0.16 m MV A velocity: 105.00 cm/s  Systemic Diam: 2.00 cm MV E/A ratio:  0.85 Cherlynn Kaiser MD Electronically signed by Cherlynn Kaiser MD Signature  Date/Time: 08/23/2022/3:55:54 PM    Final    MR BRAIN WO CONTRAST  Result Date: 08/22/2022 CLINICAL DATA:  Acute neurologic deficit EXAM: MRI HEAD WITHOUT CONTRAST TECHNIQUE: Multiplanar, multiecho pulse sequences of the brain and surrounding structures were obtained without intravenous contrast. COMPARISON:  06/21/2022 FINDINGS: Brain: Punctate focus of acute/early subacute ischemia at the lateral margin of the left basal ganglia. Multiple chronic microhemorrhages in a predominantly central distribution. There is multifocal hyperintense T2-weighted signal within the white matter. Parenchymal volume and CSF spaces are normal. 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 The midline structures are normal. Vascular: Normal flow voids. Skull and upper cervical spine: Normal marrow signal. Sinuses/Orbits: Bilateral mastoid effusions. Normal orbits and paranasal sinuses. Other: None IMPRESSION: 1. Punctate focus of acute/early subacute ischemia at the lateral margin of the left basal  ganglia. No acute hemorrhage or mass effect. 2. Multiple chronic microhemorrhages in a predominantly central distribution, consistent with chronic hypertensive angiopathy. 3. 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 Electronically Signed   By: Ulyses Jarred M.D.   On: 08/22/2022 19:39   CT ANGIO HEAD NECK W WO CM (CODE STROKE)  Result Date: 08/22/2022 CLINICAL DATA:  Stroke suspected EXAM: CT ANGIOGRAPHY HEAD AND NECK TECHNIQUE: Multidetector CT imaging of the head and neck was performed using the standard protocol during bolus administration of intravenous contrast. Multiplanar CT image reconstructions and MIPs were obtained to evaluate the vascular anatomy. Carotid stenosis measurements (when applicable) are obtained utilizing NASCET criteria, using the distal internal carotid diameter as the denominator. RADIATION DOSE REDUCTION: This exam was performed according to the departmental dose-optimization program which includes automated exposure control, adjustment of the mA and/or kV according to patient size and/or use of iterative reconstruction technique. CONTRAST:  39mL OMNIPAQUE IOHEXOL 350 MG/ML SOLN COMPARISON:  Same day CT head, MRI Head/MRA head 03/22/23 FINDINGS: CT HEAD FINDINGS See same day CT brain for intracranial findings. No contrast enhancing lesion is visualized. CTA NECK FINDINGS Aortic arch: Standard branching. Imaged portion shows no evidence of aneurysm or dissection. No significant stenosis of the major arch vessel origins. Right carotid system: No evidence of dissection, stenosis (50% or greater), or occlusion. Left carotid system: No evidence of dissection, stenosis (50% or greater), or occlusion. Vertebral arteries: Left dominant. No evidence of dissection, stenosis (50% or greater), or occlusion. Skeleton: Status post C5-C6 ACDF Other neck: Negative. Upper chest: Negative. Review of the MIP images confirms the above findings CTA HEAD FINDINGS Anterior circulation: No significant  stenosis, proximal occlusion, aneurysm, or vascular malformation. Posterior circulation: No significant stenosis, proximal occlusion, aneurysm, or vascular malformation. Venous sinuses: As permitted by contrast timing, patent. Anatomic variants: None Review of the MIP images confirms the above findings IMPRESSION: 1. No intracranial large vessel occlusion or significant stenosis. 2. No hemodynamically significant stenosis in the neck. Electronically Signed   By: Marin Roberts M.D.   On: 08/22/2022 17:23   CT HEAD CODE STROKE WO CONTRAST  Result Date: 08/22/2022 CLINICAL DATA:  Code stroke.  Neuro deficit, acute, stroke suspected EXAM: CT HEAD WITHOUT CONTRAST TECHNIQUE: Contiguous axial images were obtained from the base of the skull through the vertex without intravenous contrast. RADIATION DOSE REDUCTION: This exam was performed according to the departmental dose-optimization program which includes automated exposure control, adjustment of the mA and/or kV according to patient size and/or use of iterative reconstruction technique. COMPARISON:  None Available. FINDINGS: Brain: Remote bilateral thalamic lacunar infarcts. Remote probable perforator infarct in the right internal capsule and bilateral corona radiata. No  evidence of acute large vascular territory infarct. Additional patchy white matter hypodensities, nonspecific but compatible with chronic microvascular ischemic disease. No evidence of acute hemorrhage, mass lesion midline shift or hydrocephalus. Vascular: No hyperdense vessel identified. Skull: No acute fracture. Sinuses/Orbits: Clear sinuses.  No acute orbital findings. Other: Small left mastoid effusion. ASPECTS [facility] Stroke Program Early CT Score) total score (0-10 with 10 being normal): 10. IMPRESSION: 1. No evidence of acute large vascular territory infarct or acute hemorrhage. ASPECTS is 10. 2. Multiple remote lacunar infarcts and chronic microvascular ischemic disease. Code stroke imaging  results were communicated on 08/22/2022 at 4:59 pm to provider Dr. Lorrin Goodell via secure text paging. Electronically Signed   By: Margaretha Sheffield M.D.   On: 08/22/2022 16:59    Microbiology: Results for orders placed or performed during the hospital encounter of 06/07/22  Resp panel by RT-PCR (RSV, Flu A&B, Covid) Anterior Nasal Swab     Status: None   Collection Time: 06/07/22 11:48 AM   Specimen: Anterior Nasal Swab  Result Value Ref Range Status   SARS Coronavirus 2 by RT PCR NEGATIVE NEGATIVE Final    Comment: (NOTE) SARS-CoV-2 target nucleic acids are NOT DETECTED.  The SARS-CoV-2 RNA is generally detectable in upper respiratory specimens during the acute phase of infection. The lowest concentration of SARS-CoV-2 viral copies this assay can detect is 138 copies/mL. A negative result does not preclude SARS-Cov-2 infection and should not be used as the sole basis for treatment or other patient management decisions. A negative result may occur with  improper specimen collection/handling, submission of specimen other than nasopharyngeal swab, presence of viral mutation(s) within the areas targeted by this assay, and inadequate number of viral copies(<138 copies/mL). A negative result must be combined with clinical observations, patient history, and epidemiological information. The expected result is Negative.  Fact Sheet for Patients:  EntrepreneurPulse.com.au  Fact Sheet for Healthcare Providers:  IncredibleEmployment.be  This test is no t yet approved or cleared by the [place] FDA and  has been authorized for detection and/or diagnosis of SARS-CoV-2 by FDA under an Emergency Use Authorization (EUA). This EUA will remain  in effect (meaning this test can be used) for the duration of the COVID-19 declaration under Section 564(b)(1) of the Act, 21 U.S.C.section 360bbb-3(b)(1), unless the authorization is terminated  or revoked sooner.        Influenza A by PCR NEGATIVE NEGATIVE Final   Influenza B by PCR NEGATIVE NEGATIVE Final    Comment: (NOTE) The Xpert Xpress SARS-CoV-2/FLU/RSV plus assay is intended as an aid in the diagnosis of influenza from Nasopharyngeal swab specimens and should not be used as a sole basis for treatment. Nasal washings and aspirates are unacceptable for Xpert Xpress SARS-CoV-2/FLU/RSV testing.  Fact Sheet for Patients: EntrepreneurPulse.com.au  Fact Sheet for Healthcare Providers: IncredibleEmployment.be  This test is not yet approved or cleared by the [place] FDA and has been authorized for detection and/or diagnosis of SARS-CoV-2 by FDA under an Emergency Use Authorization (EUA). This EUA will remain in effect (meaning this test can be used) for the duration of the COVID-19 declaration under Section 564(b)(1) of the Act, 21 U.S.C. section 360bbb-3(b)(1), unless the authorization is terminated or revoked.     Resp Syncytial Virus by PCR NEGATIVE NEGATIVE Final    Comment: (NOTE) Fact Sheet for Patients: EntrepreneurPulse.com.au  Fact Sheet for Healthcare Providers: IncredibleEmployment.be  This test is not yet approved or cleared by the [place] FDA and has been authorized for detection  and/or diagnosis of SARS-CoV-2 by FDA under an Emergency Use Authorization (EUA). This EUA will remain in effect (meaning this test can be used) for the duration of the COVID-19 declaration under Section 564(b)(1) of the Act, 21 U.S.C. section 360bbb-3(b)(1), unless the authorization is terminated or revoked.  Performed at [facility], [address], [place], [place] [address]   Group A Strep by PCR     Status: None   Collection Time: 06/07/22 11:48 AM   Specimen: Anterior Nasal Swab; Sterile Swab  Result Value Ref Range Status   Group A Strep by PCR NOT DETECTED NOT DETECTED Final     Comment: Performed at [facility], [place], [place], [place] [address]    Labs: CBC: Recent Labs  Lab 08/22/22 1644 08/22/22 1652  WBC 5.5  --   NEUTROABS 3.3  --   HGB 9.5* 9.9*  HCT 29.3* 29.0*  MCV 90.7  --   PLT 252  --    Basic Metabolic Panel: Recent Labs  Lab 08/22/22 1644 08/22/22 1652  NA 138 142  K 4.1 4.1  CL 111 110  CO2 21*  --   GLUCOSE 179* 175*  BUN 38* 36*  CREATININE 2.61* 2.90*  CALCIUM 9.1  --    Liver Function Tests: Recent Labs  Lab 08/22/22 1644  AST 20  ALT 13  ALKPHOS 44  BILITOT 0.3  PROT 6.7  ALBUMIN 3.5   CBG: Recent Labs  Lab 08/22/22 1646  GLUCAP 169*    Discharge time spent: less than 30 minutes.  Signed: Annita Brod, MD [facility] 08/26/2022

## 2022-08-26 NOTE — Progress Notes (Signed)
Patient admitted via bed from 3w by staff to floor. Patient alert and oriented and oriented to floor. Patient made aware of safety and plan of care. Will continue to monitor

## 2022-08-26 NOTE — Progress Notes (Signed)
Patient on cpap provided from sleep lab.

## 2022-08-26 NOTE — H&P (Addendum)
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 Code stroke called and evaluated by neurology. CT head and CTA imaging were negative. Physical exam significant for motor 5 out of 5 in all extremities, no drift seen.  However, left foot 0/5 dorsi flexion and plantarflexion as patient unable to flex/extend foot at all. Positive LUE ataxia. MRI brain with punctate focus of acute/early subacute ischemia at the lateral margin of the left BG. Initiated Lovenox for DVT prophylaxis. She was on aspirin and Brilinta 60 mg BID prior to admission due to recurrent stroke 11/2021. Followed at [facility] neurology.  Neurology recommended aspirin and Brilinta for four weeks then stop the Brilinta and stay on aspirin alone. Her past medical history is significant for hypertension on Norvasc and Coreg, hyperlipidemia on atorvastatin 80 mg, DM2-controlled (A1c 5.9%), prior history of multiple strokes/TIA, GERD, depression on Cymbalta. She appears to be at risk for sleep apnea and is interested in considering participation in the sleep smart study.  She was given written information to review and decided to consent. CPAP tolerability test completed 3/15-3/16. She is tolerating regular diet. The patient continued to display a L lateral lean when sitting EOB, needing modA to prevent LOB, but was responding more clearly but with a delay as the session progressed. Per PT notes on 3/15, the patient stood to take steps towards [facility] to return to supine for her safety, needing modAx2 for all functional mobility. By the end of the session, she continued to display progress in her speech clarity and response speed, but was still delayed. The patient requires inpatient medicine and rehabilitation  evaluations and services for ongoing dysfunction secondary to right hemispheric TIA, prior old strokes.   Review of Systems  Constitutional:  Negative for fever.  HENT:  Negative for hearing loss and sore throat.   Eyes:        Blurred vision right eye  Respiratory:  Negative for cough, shortness of breath and wheezing.   Cardiovascular:  Negative for chest pain and palpitations.  Gastrointestinal:        Nausea with dizziness/when ambulating  Genitourinary:  Negative for dysuria and urgency.  Musculoskeletal:  Positive for neck pain.  Neurological:  Positive for dizziness and headaches.  Psychiatric/Behavioral:  Positive for depression.        Said she slept very well with CPAP last night        Past Medical History:  Diagnosis Date   Anemia      severe   Anxiety     Asthma     CKD (chronic kidney disease)      stage IV   DDD (degenerative disc disease), cervical     Depression     Diabetes mellitus (HCC)     Dislocated shoulder      right   GERD (gastroesophageal reflux disease)     Headache      migraines (about once a month)   History of kidney stones     Hypertension     Neuropathy     Peripheral vascular disease ([place])     Pneumonia     Stroke [facility])           Past Surgical History:  Procedure Laterality Date   ADENOIDECTOMY  APPENDECTOMY       CERVICAL SPINE SURGERY   06/2012    C5-C6   EYE SURGERY        left eye surgery    FOOT SURGERY       KENALOG INJECTION Left 03/10/2021    Procedure: Intravitreal Avastin Injection;  Surgeon: Sherlynn Stalls, MD;  Location: [place];  Service: Ophthalmology;  Laterality: Left;   LOOP RECORDER INSERTION N/A 08/27/2017    Procedure: LOOP RECORDER INSERTION;  Surgeon: Sanda Klein, MD;  Location: [place] [facility];  Service: Cardiovascular;  Laterality: N/A;   MEMBRANE PEEL Left 03/10/2021    Procedure: MEMBRANE PEEL;  Surgeon: Sherlynn Stalls, MD;  Location: [place];  Service: Ophthalmology;  Laterality: Left;   PARS  PLANA VITRECTOMY Left 03/10/2021    Procedure: TWENTY-FIVE GAUGE PARS PLANA VITRECTOMY;  Surgeon: Sherlynn Stalls, MD;  Location: [place];  Service: Ophthalmology;  Laterality: Left;   PHOTOCOAGULATION WITH LASER Left 03/10/2021    Procedure: PHOTOCOAGULATION WITH LASER;  Surgeon: Sherlynn Stalls, MD;  Location: [place];  Service: Ophthalmology;  Laterality: Left;   SHOULDER SURGERY       TEE WITHOUT CARDIOVERSION N/A 08/27/2017    Procedure: TRANSESOPHAGEAL ECHOCARDIOGRAM (TEE);  Surgeon: Sanda Klein, MD;  Location: [facility] ENDOSCOPY;  Service: Cardiovascular;  Laterality: N/A;   TONSILLECTOMY             Family History  Problem Relation Age of Onset   Vascular Disease Mother     CAD Mother     Heart failure Mother     Heart disease Other     Cancer Other          colon, stomach, pancreatic, lung   Diabetes Other     Breast cancer Sister 30   Seizures Maternal Uncle      Social History:  reports that she has never smoked. She has never used smokeless tobacco. She reports that she does not drink alcohol and does not use drugs. Allergies:       Allergies  Allergen Reactions   Lisinopril Swelling and Other (See Comments)      Facial swelling  angioedema   Penicillins Hives      Has patient had a PCN reaction causing immediate rash, facial/tongue/throat swelling, SOB or lightheadedness with hypotension: yes Has patient had a PCN reaction causing severe rash involving mucus membranes or skin necrosis: No Has patient had a PCN reaction that required hospitalization No Has patient had a PCN reaction occurring within the last 10 years: No If all of the above answers are "NO", then may proceed wit Rash on buttock that makes sitting very difficult   Quetiapine Other (See Comments)      Unknown    Gabapentin Swelling and Other (See Comments)      "I thought I was having another stroke." dizziness          Medications Prior to Admission  Medication Sig Dispense Refill   albuterol (PROAIR HFA)  108 (90 Base) MCG/ACT inhaler INHALE 1 OR 2 PUFFS INTO THE LUNGS EVERY 6 HOURS AS NEEDED FOR WHEEZING OR SHORTNESS OF BREATH (Patient taking differently: Inhale 2 puffs into the lungs as needed for shortness of breath or wheezing.) 8.5 g 0   amLODipine (NORVASC) 5 MG tablet TAKE 1 TABLET (5 MG TOTAL) BY MOUTH DAILY. (AM) (Patient taking differently: Take 5 mg by mouth daily.) 90 tablet 1   aspirin EC 81 MG tablet Take 1 tablet (81 mg total) by mouth  daily. 100 tablet 5   atorvastatin (LIPITOR) 80 MG tablet Take 80 mg by mouth at bedtime.       carvedilol (COREG) 12.5 MG tablet Take 1 tablet (12.5 mg total) by mouth 2 (two) times daily with a meal. 180 tablet 2   DULoxetine (CYMBALTA) 60 MG capsule Take 60 mg by mouth at bedtime.       montelukast (SINGULAIR) 10 MG tablet Take 10 mg by mouth at bedtime.       OZEMPIC, 0.25 OR 0.5 MG/DOSE, 2 MG/3ML SOPN Inject 0.5 mg into the skin See admin instructions. Inject 0.5 mg subcutaneous every 10 days       pantoprazole (PROTONIX) 40 MG tablet Take 1 tablet (40 mg total) by mouth daily. 30 tablet 0   ticagrelor (BRILINTA) 90 MG TABS tablet Take 90 mg by mouth in the morning and at bedtime.       acetaminophen (TYLENOL) 325 MG tablet Take 1-2 tablets (325-650 mg total) by mouth every 4 (four) hours as needed for mild pain. (Patient not taking: Reported on 08/24/2022)       azithromycin (ZITHROMAX) 250 MG tablet Take 1 tablet (250 mg total) by mouth daily. Take first 2 tablets together, then 1 every day until finished. (Patient not taking: Reported on 06/21/2022) 6 tablet 0   glucose blood (CONTOUR NEXT TEST) test strip 1 each by Other route 2 (two) times daily. And lancets 2/day (Patient taking differently: 1 each by Other route 2 (two) times daily.) 100 each 12   traZODone (DESYREL) 50 MG tablet Take 1 tablet (50 mg total) by mouth at bedtime as needed for sleep. (Patient not taking: Reported on 06/21/2022) 30 tablet 0          Home: Home  Living Family/patient expects to be discharged to:: Private residence Living Arrangements: Children (84 yo dtr lives with her; she goes to [employer] [facility]) Available Help at Discharge: Family, Available 24 hours/day Type of Home: Apartment Home Access: Level entry Home Layout: One level Bathroom Shower/Tub: Tub/shower unit, Architectural technologist: Handicapped height Bathroom Accessibility: Yes Home Equipment: Conservation officer, nature (2 wheels), [employer] - single point, Civil engineer, contracting, Grab bars - tub/shower, Hand held shower head, Grab bars - toilet Additional Comments: trying to get a PCA back  Lives With: Daughter   Functional History: Prior Function Prior Level of Function : Needs assist Mobility Comments: Uses cane, limited community ambulator. received HHOT and HHRN ADLs Comments: able to perform ADLs and light IADLs   Functional Status:  Mobility: Bed Mobility Overal bed mobility: Needs Assistance Bed Mobility: Supine to Sit, Sit to Supine Supine to sit: Mod assist Sit to supine: HOB elevated, Mod assist, +2 for physical assistance, +2 for safety/equipment General bed mobility comments: mod assist x2 to manage BLEs and trunk due to Left lateral leaning Transfers Overall transfer level: Needs assistance Equipment used: 2 person hand held assist Transfers: Sit to/from Stand Sit to Stand: Mod assist, +2 physical assistance, +2 safety/equipment General transfer comment: Pt cued to grab arms of therapists with face-to-face approach to stand from EOB, modAx2 for stability with transfer and stepping to the R to [facility] with noted LOB and modAx2 to recover. Ambulation/Gait Ambulation/Gait assistance: Mod assist, +2 physical assistance, +2 safety/equipment Gait Distance (Feet): 2 Feet Assistive device: 2 person hand held assist Gait Pattern/deviations: Step-through pattern, Decreased step length - right, Decreased step length - left, Decreased stride length, Decreased weight shift to right,  Ataxic, Narrow base of support, Trunk flexed,  Knees buckling General Gait Details: Pt only taking a few steps to the R towards [facility] with bil HHA today for pt safety as pt had a change in presentation during session. ModAx2 for stability, to recover when pt's knees buckled, and to direct pt to the R. Gait velocity: reduced Gait velocity interpretation: <1.31 ft/sec, indicative of household ambulator   ADL: ADL Overall ADL's : Needs assistance/impaired Eating/Feeding: Set up, Sitting Grooming: Wash/dry hands, Wash/dry face, Supervision/safety, Bed level Upper Body Bathing: Sitting, Moderate assistance Lower Body Bathing: Sitting/lateral leans, Maximal assistance Upper Body Dressing : Moderate assistance, Sitting Lower Body Dressing: Maximal assistance, Sitting/lateral leans Lower Body Dressing Details (indicate cue type and reason): max assist to donn socks while seated on EOB Toilet Transfer: Moderate assistance, +2 for safety/equipment, +2 for physical assistance Toileting- Clothing Manipulation and Hygiene: Moderate assistance Functional mobility during ADLs: Moderate assistance, +2 for physical assistance, Minimal assistance General ADL Comments: Patient limited due to dizziness   Cognition: Cognition Overall Cognitive Status: Impaired/Different from baseline Arousal/Alertness: Awake/alert Orientation Level: Oriented X4 Year: 2024 Day of Week: Correct Attention: Sustained Sustained Attention: Appears intact Memory: Impaired Memory Impairment: Storage deficit, Retrieval deficit (3/5 word recall) Awareness: Appears intact Problem Solving: Appears intact Safety/Judgment: Appears intact Cognition Arousal/Alertness: Awake/alert Behavior During Therapy: WFL for tasks assessed/performed Overall Cognitive Status: Impaired/Different from baseline General Comments: Patient slow to respond snd low voice following episode with PT earlier   Physical Exam: Blood pressure (!) 179/96, pulse  87, temperature 97.6 F (36.4 C), temperature source Oral, resp. rate 17, height 5\' 2"  (1.575 m), weight 64.4 kg, last menstrual period 05/22/2018, SpO2 100 %. Physical Exam Constitutional: No apparent distress. Appropriate appearance for age.  HENT: No JVD. Neck Supple. Trachea midline. Atraumatic, normocephalic. Eyes: PERRLA. EOMI. Visual fields grossly intact.  Patient difficulty in right eye due to cataract, + glasses + Nystagmus on bilateral horizontal vision, right greater than left, and superior vision. Cardiovascular: RRR, no murmurs/rub/gallops. No Edema. Peripheral pulses 2+  Respiratory: CTAB. No rales, rhonchi, or wheezing. On RA.  Abdomen: + bowel sounds, normoactive. No distention or tenderness.  GU: Not examined.   Skin: C/D/I. No apparent lesions. + IV. C/d/i MSK:      No apparent deformity.      Strength: Right upper and lower extremity 5 out of 5 throughout.  Left  4-/5 in upper, 4+/5 in lower.  Neurologic exam:  Cognition: AAO to person, place, time and event.  Language: Fluent, No substitutions or neoglisms.  Mild dysarthria. Names 3/3 objects correctly.  Memory: Recalls 3/3 objects at 5 minutes. No apparent deficits  Insight: Good  insight into current condition.  Mood: Pleasant affect, appropriate mood.  Sensation: To light touch intact in BL UEs and LEs  Reflexes: 2+ in BL UE and LEs.  Positive Hoffmann sign in left upper extremity.  Negative Babinski. CN: Positive mild right decreased nasolabial fold.  Otherwise, intact. Coordination: Left upper extremity ataxia on FTN  Spasticity: MAS 0 in all extremities.           Lab Results Last 48 Hours  No results found for this or any previous visit (from the past 48 hour(s)).   Imaging Results (Last 48 hours)  No results found.         Blood pressure (!) 179/96, pulse 87, temperature 97.6 F (36.4 C), temperature source Oral, resp. rate 17, height 5\' 2"  (1.575 m), weight 64.4 kg, last menstrual period  05/22/2018, SpO2 100 %.   Medical Problem List  and Plan: 1. Functional deficits secondary to stroke with L sided hemiparesis             -patient may shower             -ELOS/Goals: 12-18 days, SPV PT/OT/SLP    - Note, multiple prior history of stroke on both Brilinta, Plavix, aspirin.  High risk for recurrence.  2.  Antithrombotics: -DVT/anticoagulation:  Pharmaceutical: Lovenox             -antiplatelet therapy: Aspirin 81 mg daily and Brilinta 90 mg BID   3. Pain Management: Tylenol as needed   4. Mood/Behavior/Sleep: LCSW to evaluate and provide emotional support -generalized anxiety disorder             -depression: continue Cymbalta             -antipsychotic agents: n/a   5. Neuropsych/cognition: This patient is capable of making decisions on her own behalf.   6. Skin/Wound Care: Routine skin care checks   7. Fluids/Electrolytes/Nutrition: Routine Is and Os and follow-up chemistries   8: Hypertension: monitor TID and prn   9: Hyperlipidemia: continue statin   10: OSA: CPAP mask test>>passed first phase.  Will need to coordinate outpatient equipment/follow-up   11: Dizziness/vertigo: ? continue diazepam 5 mg q 4 hours prn (received two doses on Thursday and one on Friday). Antivert was initially given and stopped.             - ? vestibular eval ?   - Reportedly did not tolerate p.o. Valium on inpatient, however was notified by pharmacy that IV Valium and all other IV benzodiazepines are on backorder at the hospital with few doses left.  Replace regimen with Valium 5 mg every 8 hours as needed first-line, Valium IV 2.5 mg every 8 hours as needed second line.   12: Asthma, chronic: home regimen: albuterol inhaler as needed, Singulair (not restarted)   13: DM2-controlled: CBGs q AC and q HS; home regimen includes Ozempic q 10 days             -trend CBGS and decide on carb modified diet, SSI, etc.    14: CKD IV: Baseline creatinine ~2.5-3.0             -follow-up BMP    15: Anemia of chronic disease: hemoglobin stable; follow-up CBC   Barbie Banner, PA-C 08/25/2022  I have examined the patient independently and edited the note for HPI, ROS, exam, assessment, and plan as appropriate. I am in agreement with the above recommendations.   Gertie Gowda, DO 08/26/2022

## 2022-08-27 MED ORDER — DIAZEPAM 2 MG PO TABS: 2.0000 mg | ORAL_TABLET | Freq: Three times a day (TID) | ORAL | Status: DC | PRN

## 2022-08-27 MED ORDER — AMLODIPINE BESYLATE 2.5 MG PO TABS
2.5000 mg | ORAL_TABLET | Freq: Every day | ORAL | Status: DC
  Administered 2022-08-27 – 2022-08-30 (×4): 2.5 mg via ORAL
  Filled 2022-08-27 (×4): qty 1

## 2022-08-27 MED ORDER — ORAL CARE MOUTH RINSE: 15.0000 mL | OROMUCOSAL | Status: DC | PRN

## 2022-08-27 NOTE — Evaluation (Signed)
Speech Language Pathology Assessment and Plan  Patient Details  Name: Christina Orozco MRN: [id number] Date of Birth: Oct 29, 1970  SLP Diagnosis: Cognitive Impairments;Dysphagia  Rehab Potential: Good ELOS: 10-12 days    Today's Date: 08/27/2022 SLP Individual Time: 1000-1100 SLP Individual Time Calculation (min): 71 min   Hospital Problem: Principal Problem:   CVA (cerebral vascular accident) [facility])  Past Medical History:  Past Medical History:  Diagnosis Date   Anemia    severe   Anxiety    Asthma    CKD (chronic kidney disease)    stage IV   DDD (degenerative disc disease), cervical    Depression    Diabetes mellitus ([place])    Dislocated shoulder    right   GERD (gastroesophageal reflux disease)    Headache    migraines (about once a month)   History of kidney stones    Hypertension    Neuropathy    Peripheral vascular disease ([place])    Pneumonia    Stroke [facility])    Past Surgical History:  Past Surgical History:  Procedure Laterality Date   ADENOIDECTOMY     APPENDECTOMY     CERVICAL SPINE SURGERY  06/2012   C5-C6   EYE SURGERY     left eye surgery    FOOT SURGERY     KENALOG INJECTION Left 03/10/2021   Procedure: Intravitreal Avastin Injection;  Surgeon: Sherlynn Stalls, MD;  Location: [place];  Service: Ophthalmology;  Laterality: Left;   LOOP RECORDER INSERTION N/A 08/27/2017   Procedure: LOOP RECORDER INSERTION;  Surgeon: Sanda Klein, MD;  Location: [place] [facility];  Service: Cardiovascular;  Laterality: N/A;   MEMBRANE PEEL Left 03/10/2021   Procedure: MEMBRANE PEEL;  Surgeon: Sherlynn Stalls, MD;  Location: [place];  Service: Ophthalmology;  Laterality: Left;   PARS PLANA VITRECTOMY Left 03/10/2021   Procedure: TWENTY-FIVE GAUGE PARS PLANA VITRECTOMY;  Surgeon: Sherlynn Stalls, MD;  Location: [place];  Service: Ophthalmology;  Laterality: Left;   PHOTOCOAGULATION WITH LASER Left 03/10/2021   Procedure: PHOTOCOAGULATION WITH LASER;  Surgeon: Sherlynn Stalls,  MD;  Location: [place];  Service: Ophthalmology;  Laterality: Left;   SHOULDER SURGERY     TEE WITHOUT CARDIOVERSION N/A 08/27/2017   Procedure: TRANSESOPHAGEAL ECHOCARDIOGRAM (TEE);  Surgeon: Sanda Klein, MD;  Location: [facility] ENDOSCOPY;  Service: Cardiovascular;  Laterality: N/A;   TONSILLECTOMY      Assessment / Plan / Recommendation Clinical Impression  Christina Orozco is a 52 year old female who presented to the ED on 08/22/2022 via EMS with complaints of left-sided weakness, dizziness and aphasia. MRI brain with punctate focus of acute/early subacute ischemia at the lateral margin of the left BG.  Her past medical history is significant for hypertension on Norvasc and Coreg, hyperlipidemia on atorvastatin 80 mg, DM2-controlled (A1c 5.9%), prior history of multiple strokes/TIA, GERD, depression on Cymbalta. She is tolerating regular diet. The patient requires inpatient medicine and rehabilitation evaluations and services for ongoing dysfunction secondary to right hemispheric TIA, prior old strokes. SLP evaluation was completed on 08/27/22 with results as follows:  Pt presents with good toleration of regular textures and thin liquids as evidenced by no overt s/s of aspiration with solids or liquids, timely and efficient oral phase with complete clearance of residuals from oral cavity post swallow, and no complaints of globus sensation post consumption of solids.  SLP reviewed and reinforced esophageal swallowing precautions given history of esophageal dysphagia, emphasizing sitting upright for meals and alternating between solids and liquids.  Will follow  up briefly for additional monitoring of diet toleration and education regarding esophageal precautions.   Pt also presents with mild memory deficits for recall of new information.  Formal assessment was not completed on this date due to ongoing problems with significant dizziness with any movement and subsequent drowsiness secondary to medical management  of dizziness.  Pt does endorse that her memory was poor at baseline due to her history of recurrent strokes and she now relies on her pharmacy delivery system of monthly "bubble packs" for medication management due to frequently missing doses of medications when trying to manage them independently. MD arrived for morning rounds and reported that pt's speech was slightly more slurred today which was likely related to medication for dizziness.  Pt reports her speech is otherwise at baseline and she has no difficulty communicating with family and/or caregivers.    Given the abovementioned deficits, pt would benefit from ST follow up while inpatient in order to maximize functional independence and reduce burden of care prior to discharge.  Anticipate that pt may need ST follow up at next level of care depending on progress made while inpatient.     Skilled Therapeutic Interventions          Cognitive-linguistic and bedside swallow evaluation completed with results and recommendations reviewed with patient.    SLP Assessment  Patient will need skilled Speech Lanaguage Pathology Services during CIR admission    Recommendations  SLP Diet Recommendations: Age appropriate regular solids;Thin Liquid Administration via: Cup;Straw Medication Administration: Whole meds with liquid Supervision: Patient able to self feed;Intermittent supervision to cue for compensatory strategies Compensations: Small sips/bites;Slow rate Postural Changes and/or Swallow Maneuvers: Seated upright 90 degrees Oral Care Recommendations: Oral care BID Patient destination: Home Follow up Recommendations: Outpatient SLP Equipment Recommended: None recommended by SLP    SLP Frequency 1 to 3 out of 7 days   SLP Duration  SLP Intensity  SLP Treatment/Interventions 10-12 days  Minumum of 1-2 x/day, 30 to 90 minutes  Cognitive remediation/compensation;Cueing hierarchy;Internal/external aids;Patient/family  education;Dysphagia/aspiration precaution training    Pain Pain Assessment Pain Scale: 0-10 Pain Score: 0-No pain Pain Location: [place] Pain Orientation: Left  Prior Functioning Cognitive/Linguistic Baseline: Baseline deficits Baseline deficit details: memory deficits per pt Type of Home: Apartment  Lives With: Daughter Available Help at Discharge: Family;Available 24 hours/day Vocation: On disability  SLP Evaluation Cognition Overall Cognitive Status: History of cognitive impairments - at baseline Arousal/Alertness: Awake/alert Orientation Level: Oriented X4 Attention: Sustained Sustained Attention: Appears intact Memory: Impaired Memory Impairment: Decreased recall of new information;Retrieval deficit Awareness: Appears intact Problem Solving: Appears intact Safety/Judgment: Appears intact  Comprehension Auditory Comprehension Overall Auditory Comprehension: Appears within functional limits for tasks assessed Expression Expression Primary Mode of Expression: Verbal Verbal Expression Overall Verbal Expression: Appears within functional limits for tasks assessed Written Expression Dominant Hand: Right Oral Motor Oral Motor/Sensory Function Overall Oral Motor/Sensory Function: Mild impairment Facial ROM: Reduced left Facial Symmetry: Abnormal symmetry left Facial Strength: Reduced left Lingual ROM: Within Functional Limits Lingual Symmetry: Within Functional Limits Motor Speech Overall Motor Speech: Appears within functional limits for tasks assessed  Care Tool Care Tool Cognition Ability to hear (with hearing aid or hearing appliances if normally used Ability to hear (with hearing aid or hearing appliances if normally used): 0. Adequate - no difficulty in normal conservation, social interaction, listening to TV   Expression of Ideas and Wants Expression of Ideas and Wants: 4. Without difficulty (complex and basic) - expresses complex messages without difficulty and  with speech that is clear and easy to understand   Understanding Verbal and Non-Verbal Content Understanding Verbal and Non-Verbal Content: 4. Understands (complex and basic) - clear comprehension without cues or repetitions  Memory/Recall Ability Memory/Recall Ability : Current season;That he or she is in a hospital/hospital unit    Bedside Swallowing Assessment General Date of Onset: 08/22/22 Previous Swallow Assessment: BSE 3/13 Diet Prior to this Study: Regular;Thin liquids (Level 0) Temperature Spikes Noted: No Respiratory Status: Room air History of Recent Intubation: No Behavior/Cognition: Cooperative;Pleasant mood Oral Cavity - Dentition: Adequate natural dentition Self-Feeding Abilities: Able to feed self Vision: Functional for self-feeding Patient Positioning: Upright in chair/Tumbleform Baseline Vocal Quality: Normal Volitional Cough: Strong  Oral Care Assessment   Ice Chips   Thin Liquid Thin Liquid: Within functional limits Nectar Thick   Honey Thick   Puree Puree: Within functional limits Solid Solid: Within functional limits BSE Assessment Risk for Aspiration Impact on safety and function: Mild aspiration risk Other Related Risk Factors: Previous CVA;History of esophageal-related issues;Cognitive impairment;Deconditioning  Short Term Goals: Week 1: SLP Short Term Goal 1 (Week 1): Pt will consume regular textures and thin liquids with mod I use of swallowing precautions and minimal overt s/s of aspiration. SLP Short Term Goal 2 (Week 1): Pt will utilize external memory aids with supervision to recall daily information.  Refer to Care Plan for Long Term Goals  Recommendations for other services: None   Discharge Criteria: Patient will be discharged from SLP if patient refuses treatment 3 consecutive times without medical reason, if treatment goals not met, if there is a change in medical status, if patient makes no progress towards goals or if patient is  discharged from hospital.  The above assessment, treatment plan, treatment alternatives and goals were discussed and mutually agreed upon: by patient  Emilio Math 08/27/2022, 2:35 PM

## 2022-08-27 NOTE — Evaluation (Signed)
Physical Therapy Assessment and Plan  Patient Details  Name: Treniece Kohlhoff MRN: [id number] Date of Birth: 11-30-1970  PT Diagnosis: Difficulty walking, Dizziness and giddiness, and Hemiplegia non-dominant Rehab Potential: Good ELOS: 10 days   Today's Date: 08/27/2022 PT Individual Time: [id number] PT Individual Time Calculation (min): 64 min    Hospital Problem: Principal Problem:   CVA (cerebral vascular accident) [facility])   Past Medical History:  Past Medical History:  Diagnosis Date   Anemia    severe   Anxiety    Asthma    CKD (chronic kidney disease)    stage IV   DDD (degenerative disc disease), cervical    Depression    Diabetes mellitus ([place])    Dislocated shoulder    right   GERD (gastroesophageal reflux disease)    Headache    migraines (about once a month)   History of kidney stones    Hypertension    Neuropathy    Peripheral vascular disease ([place])    Pneumonia    Stroke [facility])    Past Surgical History:  Past Surgical History:  Procedure Laterality Date   ADENOIDECTOMY     APPENDECTOMY     CERVICAL SPINE SURGERY  06/2012   C5-C6   EYE SURGERY     left eye surgery    FOOT SURGERY     KENALOG INJECTION Left 03/10/2021   Procedure: Intravitreal Avastin Injection;  Surgeon: Sherlynn Stalls, MD;  Location: [place];  Service: Ophthalmology;  Laterality: Left;   LOOP RECORDER INSERTION N/A 08/27/2017   Procedure: LOOP RECORDER INSERTION;  Surgeon: Sanda Klein, MD;  Location: [place] CV LAB;  Service: Cardiovascular;  Laterality: N/A;   MEMBRANE PEEL Left 03/10/2021   Procedure: MEMBRANE PEEL;  Surgeon: Sherlynn Stalls, MD;  Location: [place];  Service: Ophthalmology;  Laterality: Left;   PARS PLANA VITRECTOMY Left 03/10/2021   Procedure: TWENTY-FIVE GAUGE PARS PLANA VITRECTOMY;  Surgeon: Sherlynn Stalls, MD;  Location: [place];  Service: Ophthalmology;  Laterality: Left;   PHOTOCOAGULATION WITH LASER Left 03/10/2021   Procedure: PHOTOCOAGULATION WITH LASER;   Surgeon: Sherlynn Stalls, MD;  Location: [place];  Service: Ophthalmology;  Laterality: Left;   SHOULDER SURGERY     TEE WITHOUT CARDIOVERSION N/A 08/27/2017   Procedure: TRANSESOPHAGEAL ECHOCARDIOGRAM (TEE);  Surgeon: Sanda Klein, MD;  Location: [facility] ENDOSCOPY;  Service: Cardiovascular;  Laterality: N/A;   TONSILLECTOMY      Assessment & Plan Clinical Impression: Patient  is a 52 year old female who presented to the ED on 08/22/2022 via EMS with complaints of left-sided weakness, dizziness and aphasia. Code stroke called and evaluated by neurology. CT head and CTA imaging were negative. Physical exam significant for motor 5 out of 5 in all extremities, no drift seen.  However, left foot 0/5 dorsi flexion and plantarflexion as patient unable to flex/extend foot at all. Positive LUE ataxia. MRI brain with punctate focus of acute/early subacute ischemia at the lateral margin of the left BG. Initiated Lovenox for DVT prophylaxis. She was on aspirin and Brilinta 60 mg BID prior to admission due to recurrent stroke 11/2021. Followed at [facility] neurology.  Neurology recommended aspirin and Brilinta for four weeks then stop the Brilinta and stay on aspirin alone. Her past medical history is significant for hypertension on Norvasc and Coreg, hyperlipidemia on atorvastatin 80 mg, DM2-controlled (A1c 5.9%), prior history of multiple strokes/TIA, GERD, depression on Cymbalta. She appears to be at risk for sleep apnea and is interested in considering participation in the sleep  smart study.  She was given written information to review and decided to consent. CPAP tolerability test completed 3/15-3/16. She is tolerating regular diet. The patient continued to display a L lateral lean when sitting EOB, needing modA to prevent LOB, but was responding more clearly but with a delay as the session progressed. Per PT notes on 3/15, the patient stood to take steps towards [facility] to return to supine for her safety, needing modAx2 for all  functional mobility. By the end of the session, she continued to display progress in her speech clarity and response speed, but was still delayed. The patient requires inpatient medicine and rehabilitation evaluations and services for ongoing dysfunction secondary to right hemispheric TIA, prior old strokes.    Patient currently requires mod with mobility secondary to muscle weakness and dizziness .  Prior to hospitalization, patient was modified independent  with mobility and lived with Daughter in a [place] home.  Home access is  Level entry.  Patient will benefit from skilled PT intervention to maximize safe functional mobility, minimize fall risk, and decrease caregiver burden for planned discharge home with 24 hour supervision.  Anticipate patient will benefit from follow up [place] at discharge.  PT - End of Session Activity Tolerance: Tolerates 30+ min activity with multiple rests Endurance Deficit: Yes Endurance Deficit Description: 2/2 dizziness PT Assessment Rehab Potential (ACUTE/IP ONLY): Good PT Barriers to Discharge: Other (comments) PT Barriers to Discharge Comments: dizziness PT Patient demonstrates impairments in the following area(s): Balance;Endurance;Motor;Safety PT Transfers Functional Problem(s): Bed Mobility;Bed to Chair;Car PT Locomotion Functional Problem(s): Ambulation PT Plan PT Intensity: Minimum of 1-2 x/day ,45 to 90 minutes PT Frequency: 5 out of 7 days PT Duration Estimated Length of Stay: 10 days PT Treatment/Interventions: Ambulation/gait training;Balance/vestibular training;Cognitive remediation/compensation;Discharge planning;Disease management/prevention;DME/adaptive equipment instruction;Functional mobility training;Pain management;Patient/family education;Stair training;Therapeutic Activities;Therapeutic Exercise;UE/LE Strength taining/ROM;UE/LE Coordination activities PT Transfers Anticipated Outcome(s): ModI PT Locomotion Anticipated Outcome(s): sup +  LRAD PT Recommendation Follow Up Recommendations: Home health PT Patient destination: Home Equipment Recommended: To be determined Equipment Details: pt has RW   PT Evaluation Precautions/Restrictions Precautions Precautions: Fall Precaution Comments: dizziness with position changes Restrictions Weight Bearing Restrictions: No General Chart Reviewed: Yes Vital Signs Pain Pain Assessment Pain Scale: 0-10 Pain Score: 0-No pain Pain Location: Calf Pain Orientation: Left Pain Interference Pain Interference Pain Effect on Sleep: 1. Rarely or not at all Pain Interference with Therapy Activities: 1. Rarely or not at all Pain Interference with Day-to-Day Activities: 1. Rarely or not at all Home Living/Prior [place] Available Help at Discharge: Family;Available 24 hours/day Type of Home: Apartment Home Access: Level entry Home Layout: One level Bathroom Shower/Tub: Tub/shower unit;Curtain Biochemist, clinical: Handicapped height Bathroom Accessibility: Yes  Lives With: Daughter Prior Function Level of Independence: Requires assistive device for independence  Able to Take Stairs?: No Vocation: On disability Leisure: Hobbies-no Vision/Perception  Vision - History Ability to See in Adequate Light: 1 Impaired Vision - Assessment Eye Alignment: Impaired (comment) Ocular Range of Motion: Impaired-to be further tested in functional context Alignment/Gaze Preference: Within Defined Limits Tracking/Visual Pursuits: Decreased smoothness of horizontal tracking Saccades: Impaired - to be further tested in functional context Convergence: Impaired - to be further tested in functional context Additional Comments: cataract in R eye currently - reports this impacts near vision, L eye has had cataract removed Perception Perception: Within Functional Limits Praxis Praxis: Intact  Cognition Overall Cognitive Status: History of cognitive impairments - at  baseline Arousal/Alertness: Awake/alert Orientation Level: Oriented X4 Attention: Sustained Sustained  Attention: Appears intact Memory: Impaired Memory Impairment: Storage deficit;Retrieval deficit Awareness: Appears intact Problem Solving: Appears intact Safety/Judgment: Appears intact Sensation Sensation Light Touch: Appears Intact Hot/Cold: Appears Intact Proprioception: Appears Intact Stereognosis: Not tested Additional Comments: reports prior problems with sensory loss with previous stroke - but issues resolved Coordination Gross Motor Movements are Fluid and Coordinated: No Fine Motor Movements are Fluid and Coordinated: No Coordination and Movement Description: overshoots/undershoots LUE Finger Nose Finger Test: dysmetric movement LUE Motor  Motor Motor: Hemiplegia;Abnormal tone;Ataxia;Abnormal postural alignment and control Motor - Skilled Clinical Observations: Has active movement throughtout Left UE- Lacks precision   Trunk/Postural Assessment  Cervical Assessment Cervical Assessment: Exceptions to [facility] (Limited head movement due to vestibular dysfunction) Thoracic Assessment Thoracic Assessment: Within Functional Limits Lumbar Assessment Lumbar Assessment: Within Functional Limits Postural Control Postural Control: Deficits on evaluation (falls backward onto bed with report of dizziness)  Balance Balance Balance Assessed: Yes Static Sitting Balance Static Sitting - Balance Support: Feet supported;Bilateral upper extremity supported Static Sitting - Level of Assistance: 5: Stand by assistance Dynamic Sitting Balance Dynamic Sitting - Balance Support: Bilateral upper extremity supported;Feet supported;During functional activity Dynamic Sitting - Level of Assistance: 4: Min assist Dynamic Sitting - Balance Activities: Forward lean/weight shifting Static Standing Balance Static Standing - Balance Support: Bilateral upper extremity supported;During functional  activity Static Standing - Level of Assistance: 4: Min assist Static Standing - Comment/# of Minutes: 5 seconds Dynamic Standing Balance Dynamic Standing - Balance Support: During functional activity Dynamic Standing - Level of Assistance: 3: Mod assist Dynamic Standing - Balance Activities: Forward lean/weight shifting;Reaching for objects Dynamic Standing - Comments: heavy reliance on UE's Extremity Assessment  RUE Assessment RUE Assessment: Within Functional Limits LUE Assessment LUE Assessment: Exceptions to [facility] Active Range of Motion (AROM) Comments: 80* shoulder flexion - injury General Strength Comments: Has torn rotator cuff LUE - Does not want surgery LUE Body System: Neuro Brunstrum levels for arm and hand: Arm;Hand Brunstrum level for arm: Stage V Relative Independence from Synergy Brunstrum level for hand: Stage VI Isolated joint movements RLE Assessment RLE Assessment: Exceptions to [facility] General Strength Comments: gross 4-/5 LLE Assessment LLE Assessment: Exceptions to [facility] General Strength Comments: gross 3+/5  Care Tool Care Tool Bed Mobility Roll left and right activity   Roll left and right assist level: Minimal Assistance - Patient > 75%    Sit to lying activity   Sit to lying assist level: Minimal Assistance - Patient > 75%    Lying to sitting on side of bed activity   Lying to sitting on side of bed assist level: the ability to move from lying on the back to sitting on the side of the bed with no back support.: Minimal Assistance - Patient > 75%     Care Tool Transfers Sit to stand transfer   Sit to stand assist level: Minimal Assistance - Patient > 75%    Chair/bed transfer   Chair/bed transfer assist level: Minimal Assistance - Patient > 75%     Toilet transfer Toilet transfer activity did not occur: Safety/medical concerns (2/2 dizziness)      Scientist, product/process development transfer activity did not occur: Safety/medical concerns (2/2 dizziness)        Care  Tool Locomotion Ambulation   Assist level: Moderate Assistance - Patient 50 - 74% Assistive device: Walker-rolling Max distance: 41ft  Walk 10 feet activity Walk 10 feet activity did not occur: Safety/medical concerns (2/2 dizziness)       Walk 50 feet  with 2 turns activity Walk 50 feet with 2 turns activity did not occur: Safety/medical concerns (2/2 dizziness)      Walk 150 feet activity Walk 150 feet activity did not occur: Safety/medical concerns (2/2 dizziness)      Walk 10 feet on uneven surfaces activity Walk 10 feet on uneven surfaces activity did not occur: Safety/medical concerns (2/2 dizziness)      Stairs Stair activity did not occur: Safety/medical concerns (2/2 dizziness)        Walk up/down 1 step activity Walk up/down 1 step or curb (drop down) activity did not occur: Safety/medical concerns (2/2 dizziness)      Walk up/down 4 steps activity Walk up/down 4 steps activity did not occur: Safety/medical concerns (2/2 dizziness)      Walk up/down 12 steps activity Walk up/down 12 steps activity did not occur: Safety/medical concerns (2/2 dizziness)      Pick up small objects from floor Pick up small object from the floor (from standing position) activity did not occur: Safety/medical concerns (2/2 dizziness)      Wheelchair Is the patient using a wheelchair?: No   Wheelchair activity did not occur: N/A      Wheel 50 feet with 2 turns activity Wheelchair 50 feet with 2 turns activity did not occur: N/A    Wheel 150 feet activity Wheelchair 150 feet activity did not occur: N/A      Refer to Care Plan for Long Term Goals  SHORT TERM GOAL WEEK 1 PT Short Term Goal 1 (Week 1): STG = LTG 2/2 LOS  Recommendations for other services: None   Skilled Therapeutic Intervention Mobility Bed Mobility Bed Mobility: Rolling Left Rolling Left: Minimal Assistance - Patient > 75% Supine to Sit: Minimal Assistance - Patient > 75% Sit to Supine: Minimal Assistance -  Patient > 75% Transfers Transfers: Stand Pivot Transfers;Stand to Sit;Sit to Stand Sit to Stand: Minimal Assistance - Patient > 75% Stand to Sit: Minimal Assistance - Patient > 75% Stand Pivot Transfers: Minimal Assistance - Patient > 75% Stand Pivot Transfer Details: Tactile cues for posture;Verbal cues for precautions/safety Transfer (Assistive device): Rolling walker Locomotion  Gait Ambulation: Yes Gait Assistance: Moderate Assistance - Patient 50-74% Gait Distance (Feet): 4 Feet Assistive device: Rolling walker Gait Assistance Details: Verbal cues for technique;Verbal cues for precautions/safety;Verbal cues for gait pattern;Verbal cues for safe use of DME/AE  Session Note: Chart reviewed and pt agreeable to therapy. Pt received semi-reclined in bed with no c/o pain. Also of note, pt required extensive time to wake 2/2 strong drowsiness from medication. Session focused on evaluation and functional transfers to promote safe home access. Pt initiated session with eval as described above. Pt then transferred again froom bed<>WC using MinA + RW to confirm RW as safest approach. In bed, pt required MinA to change soiled brief. Pt c/o strong dizziness, so session focused on confirmation of pt understanding of therapy purpose, expectations, and goals. At end of session, pt was left seated in bed with alarm engaged, nurse call bell and all needs in reach.     Discharge Criteria: Patient will be discharged from PT if patient refuses treatment 3 consecutive times without medical reason, if treatment goals not met, if there is a change in medical status, if patient makes no progress towards goals or if patient is discharged from hospital.  The above assessment, treatment plan, treatment alternatives and goals were discussed and mutually agreed upon: by patient  Marquette Old 08/27/2022, 1:54 PM

## 2022-08-27 NOTE — Evaluation (Signed)
Occupational Therapy Assessment and Plan  Patient Details  Name: Christina Orozco MRN: [id number] Date of Birth: 1971/03/14  OT Diagnosis: abnormal posture, ataxia, disturbance of vision, hemiplegia affecting non-dominant side, and muscle weakness (generalized) Rehab Potential: Rehab Potential (ACUTE ONLY): Good ELOS: 10-12 days   Today's Date: 08/27/2022 OT Individual Time: [id number] OT Individual Time Calculation (min): 75 min     Hospital Problem: Principal Problem:   CVA (cerebral vascular accident) ([place])   Past Medical History:  Past Medical History:  Diagnosis Date   Anemia    severe   Anxiety    Asthma    CKD (chronic kidney disease)    stage IV   DDD (degenerative disc disease), cervical    Depression    Diabetes mellitus (HCC)    Dislocated shoulder    right   GERD (gastroesophageal reflux disease)    Headache    migraines (about once a month)   History of kidney stones    Hypertension    Neuropathy    Peripheral vascular disease ([place])    Pneumonia    Stroke [facility])    Past Surgical History:  Past Surgical History:  Procedure Laterality Date   ADENOIDECTOMY     APPENDECTOMY     CERVICAL SPINE SURGERY  06/2012   C5-C6   EYE SURGERY     left eye surgery    FOOT SURGERY     KENALOG INJECTION Left 03/10/2021   Procedure: Intravitreal Avastin Injection;  Surgeon: Sherlynn Stalls, MD;  Location: [place];  Service: Ophthalmology;  Laterality: Left;   LOOP RECORDER INSERTION N/A 08/27/2017   Procedure: LOOP RECORDER INSERTION;  Surgeon: Sanda Klein, MD;  Location: [place] [facility];  Service: Cardiovascular;  Laterality: N/A;   MEMBRANE PEEL Left 03/10/2021   Procedure: MEMBRANE PEEL;  Surgeon: Sherlynn Stalls, MD;  Location: [place];  Service: Ophthalmology;  Laterality: Left;   PARS PLANA VITRECTOMY Left 03/10/2021   Procedure: TWENTY-FIVE GAUGE PARS PLANA VITRECTOMY;  Surgeon: Sherlynn Stalls, MD;  Location: [place];  Service: Ophthalmology;  Laterality: Left;    PHOTOCOAGULATION WITH LASER Left 03/10/2021   Procedure: PHOTOCOAGULATION WITH LASER;  Surgeon: Sherlynn Stalls, MD;  Location: [place];  Service: Ophthalmology;  Laterality: Left;   SHOULDER SURGERY     TEE WITHOUT CARDIOVERSION N/A 08/27/2017   Procedure: TRANSESOPHAGEAL ECHOCARDIOGRAM (TEE);  Surgeon: Sanda Klein, MD;  Location: [facility] ENDOSCOPY;  Service: Cardiovascular;  Laterality: N/A;   TONSILLECTOMY      Assessment & Plan Clinical Impression: Christina Orozco is a 52 year old female who presented to the ED on 08/22/2022 via EMS with complaints of left-sided weakness, dizziness and aphasia. Code stroke called and evaluated by neurology. CT head and CTA imaging were negative. Physical exam significant for motor 5 out of 5 in all extremities, no drift seen.  However, left foot 0/5 dorsi flexion and plantarflexion as patient unable to flex/extend foot at all. Positive LUE ataxia. MRI brain with punctate focus of acute/early subacute ischemia at the lateral margin of the left BG. Initiated Lovenox for DVT prophylaxis. She was on aspirin and Brilinta 60 mg BID prior to admission due to recurrent stroke 11/2021. Followed at [facility] neurology.  Neurology recommended aspirin and Brilinta for four weeks then stop the Brilinta and stay on aspirin alone. Her past medical history is significant for hypertension on Norvasc and Coreg, hyperlipidemia on atorvastatin 80 mg, DM2-controlled (A1c 5.9%), prior history of multiple strokes/TIA, GERD, depression on Cymbalta. She appears to be at risk for  sleep apnea and is interested in considering participation in the sleep smart study.  She was given written information to review and decided to consent. CPAP tolerability test completed 3/15-3/16. She is tolerating regular diet. The patient continued to display a L lateral lean when sitting EOB, needing modA to prevent LOB, but was responding more clearly but with a delay as the session progressed. Per PT notes on 3/15, the  patient stood to take steps towards [facility] to return to supine for her safety, needing modAx2 for all functional mobility. By the end of the session, she continued to display progress in her speech clarity and response speed, but was still delayed. The patient requires inpatient medicine and rehabilitation evaluations and services for ongoing dysfunction secondary to right hemispheric TIA, prior old strokes.  Patient transferred to CIR on 08/26/2022 .    Patient currently requires min with basic self-care skills secondary to ataxia and decreased coordination, decreased visual motor skills, central origin, and decreased sitting balance, decreased standing balance, hemiplegia, and decreased balance strategies.  Prior to hospitalization, patient could complete BADL with independent .  Patient will benefit from skilled intervention to decrease level of assist with basic self-care skills prior to discharge home with care partner.  Anticipate patient will require minimal physical assistance and follow up home health and follow up outpatient.  OT - End of Session Activity Tolerance: Decreased this session Endurance Deficit: Yes OT Assessment Rehab Potential (ACUTE ONLY): Good OT Patient demonstrates impairments in the following area(s): Balance;Motor;Cognition;Vision;Endurance;Safety OT Basic ADL's Functional Problem(s): Eating;Grooming;Bathing;Dressing;Toileting OT Advanced ADL's Functional Problem(s): Simple Meal Preparation OT Transfers Functional Problem(s): Toilet;Tub/Shower OT Additional Impairment(s): Fuctional Use of Upper Extremity OT Plan OT Intensity: Minimum of 1-2 x/day, 45 to 90 minutes OT Frequency: 5 out of 7 days OT Duration/Estimated Length of Stay: 10-12 days OT Treatment/Interventions: Balance/vestibular training;Community reintegration;Disease mangement/prevention;Neuromuscular re-education;Patient/family education;Self Care/advanced ADL retraining;Therapeutic Exercise;UE/LE  Coordination activities;Visual/perceptual remediation/compensation;UE/LE Strength taining/ROM;Therapeutic Activities;Functional mobility training;DME/adaptive equipment instruction;Discharge planning;Cognitive remediation/compensation OT Self Feeding Anticipated Outcome(s): Mod I OT Basic Self-Care Anticipated Outcome(s): Intermittent min A OT Toileting Anticipated Outcome(s): Mod I OT Bathroom Transfers Anticipated Outcome(s): Mod I OT Recommendation Recommendations for Other Services: Vestibular eval Patient destination: Home Follow Up Recommendations: Outpatient OT;Home health OT Equipment Recommended: To be determined   OT Evaluation Precautions/Restrictions  Precautions Precautions: Fall Precaution Comments: dizziness with position changes Restrictions Weight Bearing Restrictions: No General   Vital Signs  Pain Pain Assessment Pain Score: 5  Pain Location: Calf Pain Orientation: Left Home Living/Prior Functioning Home Living Family/patient expects to be discharged to:: Private residence Living Arrangements: Children Available Help at Discharge: Family, Available 24 hours/day Type of Home: Apartment Home Access: Level entry Home Layout: One level Bathroom Shower/Tub: Tub/shower unit, Architectural technologist: Handicapped height Bathroom Accessibility: Yes Additional Comments: trying to get a PCA back  Lives With: Daughter IADL History Homemaking Responsibilities: Yes Meal Prep Responsibility: Primary Laundry Responsibility: Primary Cleaning Responsibility: No Bill Paying/Finance Responsibility: Primary Shopping Responsibility: Primary Child Care Responsibility: No Homemaking Comments: was cooking prior to TIA in January - has had PCA to assist with cleaning and bathing recently Current License: Yes Mode of Transportation:  (stopped driving in January) Occupation: On disability Type of Occupation: Web designer Leisure and Hobbies: napping, "I don't  go nowhere" Prior Function Level of Independence: Independent with basic ADLs  Able to Take Stairs?: No Vocation: On disability Leisure: Hobbies-no Vision Baseline Vision/History: 4 Cataracts Ability to See in Adequate Light: 1 Impaired Patient Visual Report: Blurring of vision  Vision Assessment?: Yes Eye Alignment: Impaired (comment) Ocular Range of Motion: Impaired-to be further tested in functional context Alignment/Gaze Preference: Within Defined Limits Tracking/Visual Pursuits: Decreased smoothness of horizontal tracking Saccades: Impaired - to be further tested in functional context Convergence: Impaired - to be further tested in functional context Additional Comments: cataract in R eye currently - reports this impacts near vision, L eye has had cataract removed Perception  Perception: Within Functional Limits Praxis Praxis: Intact Cognition Cognition Overall Cognitive Status: History of cognitive impairments - at baseline Arousal/Alertness: Awake/alert Orientation Level: Person;Place;Situation Person: Oriented Place: Oriented Situation: Oriented Memory: Impaired Memory Impairment: Storage deficit;Retrieval deficit Attention: Sustained Sustained Attention: Appears intact Awareness: Appears intact Problem Solving: Appears intact Safety/Judgment: Appears intact Brief Interview for Mental Status (BIMS) Repetition of Three Words (First Attempt): 3 Temporal Orientation: Year: Correct Temporal Orientation: Month: Accurate within 5 days Temporal Orientation: Day: Correct Recall: "Sock": Yes, no cue required Recall: "Blue": Yes, no cue required Recall: "Bed": Yes, no cue required BIMS Summary Score: 15 Sensation Sensation Light Touch: Appears Intact Hot/Cold: Appears Intact Proprioception: Appears Intact Stereognosis: Not tested Additional Comments: reports prior problems with sensory loss with previous stroke - but issues resolved Coordination Gross Motor  Movements are Fluid and Coordinated: No Fine Motor Movements are Fluid and Coordinated: No Coordination and Movement Description: overshoots/undershoots LUE Finger Nose Finger Test: dysmetric movement LUE Motor  Motor Motor: Hemiplegia;Abnormal tone;Ataxia;Abnormal postural alignment and control Motor - Skilled Clinical Observations: Has active movement throughtout Left UE- Lacks precision  Trunk/Postural Assessment  Cervical Assessment Cervical Assessment: Exceptions to [facility] (Limited head movement due to vestibular dysfunction) Thoracic Assessment Thoracic Assessment: Within Functional Limits Lumbar Assessment Lumbar Assessment: Within Functional Limits Postural Control Postural Control: Deficits on evaluation (falls backward onto bed with report of dizziness)  Balance Balance Balance Assessed: Yes Static Sitting Balance Static Sitting - Balance Support: Feet supported;Bilateral upper extremity supported Static Sitting - Level of Assistance: 5: Stand by assistance Dynamic Sitting Balance Dynamic Sitting - Balance Support: Bilateral upper extremity supported;Feet supported;During functional activity Dynamic Sitting - Level of Assistance: 4: Min assist Dynamic Sitting - Balance Activities: Forward lean/weight shifting Static Standing Balance Static Standing - Balance Support: Bilateral upper extremity supported;During functional activity Static Standing - Level of Assistance: 4: Min assist Static Standing - Comment/# of Minutes: 5 seconds Dynamic Standing Balance Dynamic Standing - Balance Support: During functional activity Dynamic Standing - Level of Assistance: 3: Mod assist Dynamic Standing - Balance Activities: Forward lean/weight shifting;Reaching for objects Dynamic Standing - Comments: heavy reliance on UE's Extremity/Trunk Assessment RUE Assessment RUE Assessment: Within Functional Limits LUE Assessment LUE Assessment: Exceptions to [facility] Active Range of Motion (AROM)  Comments: 80* shoulder flexion - injury General Strength Comments: Has torn rotator cuff LUE - Does not want surgery LUE Body System: Neuro Brunstrum levels for arm and hand: Arm;Hand Brunstrum level for arm: Stage V Relative Independence from [employer] Brunstrum level for hand: Stage VI Isolated joint movements  Care Tool Care Tool Self Care Eating   Eating Assist Level: Supervision/Verbal cueing    Oral Care    Oral Care Assist Level: Set up assist    Bathing   Body parts bathed by patient: Right arm;Left arm;Chest;Abdomen;Front perineal area;Buttocks;Right upper leg;Left upper leg;Right lower leg;Left lower leg;Face     Assist Level: Minimal Assistance - Patient > 75%    Upper Body Dressing(including orthotics)   What is the patient wearing?: Bra;Pull over shirt   Assist Level: Minimal Assistance - Patient > 75%  Lower Body Dressing (excluding footwear)   What is the patient wearing?: Underwear/pull up;Pants Assist for lower body dressing: Minimal Assistance - Patient > 75%    Putting on/Taking off footwear   What is the patient wearing?: [place] for footwear: Total Assistance - Patient < 25%       Care Tool Toileting Toileting activity Toileting Activity did not occur (Clothing management and hygiene only): N/A (no void or bm)       Care Tool Bed Mobility Roll left and right activity   Roll left and right assist level: Minimal Assistance - Patient > 75%    Sit to lying activity   Sit to lying assist level: Minimal Assistance - Patient > 75%    Lying to sitting on side of bed activity   Lying to sitting on side of bed assist level: the ability to move from lying on the back to sitting on the side of the bed with no back support.: Minimal Assistance - Patient > 75%     Care Tool Transfers Sit to stand transfer   Sit to stand assist level: Minimal Assistance - Patient > 75%    Chair/bed transfer   Chair/bed transfer assist level: Minimal Assistance -  Patient > 75%     Toilet transfer Toilet transfer activity did not occur: Refused       Care Tool Cognition  Expression of Ideas and Wants Expression of Ideas and Wants: 4. Without difficulty (complex and basic) - expresses complex messages without difficulty and with speech that is clear and easy to understand  Understanding Verbal and Non-Verbal Content Understanding Verbal and Non-Verbal Content: 3. Usually understands - understands most conversations, but misses some part/intent of message. Requires cues at times to understand   Memory/Recall Ability Memory/Recall Ability : Current season;That he or she is in a hospital/hospital unit   Refer to Care Plan for [place] 1 OT Short Term Goal 1 (Week 1): Patient will feed herself with Mod I OT Short Term Goal 2 (Week 1): Patient will dress upper body with set up assistance OT Short Term Goal 3 (Week 1): Patient will don/doff pants with close supervision OT Short Term Goal 4 (Week 1): Patient will don/doff socks/shoes with min assist OT Short Term Goal 5 (Week 1): Patient will trasnfer to [facility] with min assist  Recommendations for other services: None    Skilled Therapeutic Intervention Patient received supine in bed, asleep initially.  Patient easy to wake, and agreeable to OT session.  Patient assisted to edge of bed to eat breakfast.  Patient had difficulty remianing in unsupported sitting and assisted back to supine.  Patient able to self report goals and history.  Patient allowed dizziness to subside, and again sat at edge of bed, then transitioned to recliner to help with tolerance of upright.  Patient left in recliner at end of session with chair pad alarm and call bell in reach.   ADL ADL Eating: Set up Where Assessed-Eating: Chair Grooming: Minimal assistance Where Assessed-Grooming: Chair Upper Body Bathing: Minimal assistance Where Assessed-Upper Body Bathing: Chair Lower Body Bathing: Minimal  assistance Where Assessed-Lower Body Bathing: Chair Upper Body Dressing: Minimal assistance Where Assessed-Upper Body Dressing: Chair Lower Body Dressing: Maximal assistance Where Assessed-Lower Body Dressing: Chair Toileting: Unable to assess Toilet Transfer: Unable to assess Toilet Transfer Method: Unable to assess Tub/Shower Transfer: Unable to assess Tub/Shower Transfer Method: Unable to assess Social research officer, government: Unable to assess [employer]  Method: Unable to assess ADL Comments: has tub/shower at home Mobility  Bed Mobility Bed Mobility: Rolling Left Rolling Left: Minimal Assistance - Patient > 75% Supine to Sit: Minimal Assistance - Patient > 75% Sit to Supine: Minimal Assistance - Patient > 75% Transfers Sit to Stand: Minimal Assistance - Patient > 75% Stand to Sit: Minimal Assistance - Patient > 75%   Discharge Criteria: Patient will be discharged from OT if patient refuses treatment 3 consecutive times without medical reason, if treatment goals not met, if there is a change in medical status, if patient makes no progress towards goals or if patient is discharged from hospital.  The above assessment, treatment plan, treatment alternatives and goals were discussed and mutually agreed upon: by patient  Mariah Milling 08/27/2022, 12:57 PM

## 2022-08-27 NOTE — Progress Notes (Signed)
PROGRESS NOTE   Subjective/Complaints:  No acute complaints.  Hypertensive overnight, requiring as needed hydralazine.  Patient remains asymptomatic.  Did take p.o. 5 mg Valium this a.m., which caused some lethargy.  Discussed hospital supply issue with IV Valium, patient agreeable to trial of p.o. only regimen, requests discontinuation of IV.  ROS: + Dizziness/nausea  -improved . denies fevers, chills, /V, abdominal pain, constipation, diarrhea, SOB, cough, chest pain, new weakness or paraesthesias.    Objective:   No results found. Recent Labs    08/26/22 1509  WBC 6.4  HGB 9.9*  HCT 30.3*  PLT 222   Recent Labs    08/26/22 1509  NA 141  K 4.1  CL 111  CO2 23  GLUCOSE 89  BUN 42*  CREATININE 3.06*  CALCIUM 9.2    Intake/Output Summary (Last 24 hours) at 08/27/2022 1541 Last data filed at 08/27/2022 1240 Gross per 24 hour  Intake 720 ml  Output --  Net 720 ml        Physical Exam: Vital Signs Blood pressure (!) 182/91, pulse 79, temperature 98.2 F (36.8 C), resp. rate 20, height 5\' 2"  (1.575 m), weight 68.8 kg, last menstrual period 05/22/2018, SpO2 100 %. Constitutional: No apparent distress. Appropriate appearance for age.  HENT: No JVD. Neck Supple. Trachea midline. Atraumatic, normocephalic. Eyes: PERRLA. EOMI. Visual fields grossly intact.  Patient difficulty in right eye due to cataract, + glasses + Nystagmus on bilateral horizontal vision, right greater than left, and superior vision. Cardiovascular: RRR, no murmurs/rub/gallops. No Edema. Peripheral pulses 2+  Respiratory: CTAB. No rales, rhonchi, or wheezing. On RA.  Abdomen: + bowel sounds, normoactive. No distention or tenderness.  GU: Not examined.   Skin: C/D/I. No apparent lesions. + IV. C/d/i MSK:      No apparent deformity.      Strength: Right upper and lower extremity 5 out of 5 throughout.  Left  4-/5 in upper, 4+/5 in lower.    Neurologic exam:  Cognition: AAO to person, place, time and event. + Mildly lethargic   Language: Fluent, No substitutions or neoglisms.  Mild dysarthria. Names 3/3 objects correctly.  Memory: Recalls 3/3 objects at 5 minutes. No apparent deficits  Insight: Good  insight into current condition.  Mood: Pleasant affect, appropriate mood.  Sensation: To light touch intact in BL UEs and LEs  Reflexes: 2+ in BL UE and LEs.  Positive Hoffmann sign in left upper extremity.  Negative Babinski. CN: Positive mild right decreased nasolabial fold.  Otherwise, intact. Coordination: Left upper extremity ataxia on FTN  Spasticity: MAS 0 in all extremities.          Assessment/Plan: 1. Functional deficits which require 3+ hours per day of interdisciplinary therapy in a comprehensive inpatient rehab setting. Physiatrist is providing close team supervision and 24 hour management of active medical problems listed below. Physiatrist and rehab team continue to assess barriers to discharge/monitor patient progress toward functional and medical goals  Care Tool:  Bathing    Body parts bathed by patient: Right arm, Left arm, Chest, Abdomen, Front perineal area, Buttocks, Right upper leg, Left upper leg, Right lower leg, Left lower leg, Face  Bathing assist Assist Level: Minimal Assistance - Patient > 75%     Upper Body Dressing/Undressing Upper body dressing   What is the patient wearing?: Bra, Pull over shirt    Upper body assist Assist Level: Minimal Assistance - Patient > 75%    Lower Body Dressing/Undressing Lower body dressing      What is the patient wearing?: Underwear/pull up, Pants     Lower body assist Assist for lower body dressing: Minimal Assistance - Patient > 75%     Toileting Toileting Toileting Activity did not occur (Clothing management and hygiene only): N/A (no void or bm)  Toileting assist       Transfers Chair/bed transfer  Transfers assist      Chair/bed transfer assist level: Minimal Assistance - Patient > 75%     Locomotion Ambulation   Ambulation assist      Assist level: Moderate Assistance - Patient 50 - 74% Assistive device: Walker-rolling Max distance: 24ft   Walk 10 feet activity   Assist  Walk 10 feet activity did not occur: Safety/medical concerns (2/2 dizziness)        Walk 50 feet activity   Assist Walk 50 feet with 2 turns activity did not occur: Safety/medical concerns (2/2 dizziness)         Walk 150 feet activity   Assist Walk 150 feet activity did not occur: Safety/medical concerns (2/2 dizziness)         Walk 10 feet on uneven surface  activity   Assist Walk 10 feet on uneven surfaces activity did not occur: Safety/medical concerns (2/2 dizziness)         Wheelchair     Assist Is the patient using a wheelchair?: No   Wheelchair activity did not occur: N/A         Wheelchair 50 feet with 2 turns activity    Assist    Wheelchair 50 feet with 2 turns activity did not occur: N/A       Wheelchair 150 feet activity     Assist  Wheelchair 150 feet activity did not occur: N/A       Blood pressure (!) 182/91, pulse 79, temperature 98.2 F (36.8 C), resp. rate 20, height 5\' 2"  (1.575 m), weight 68.8 kg, last menstrual period 05/22/2018, SpO2 100 %.   Medical Problem List and Plan: 1. Functional deficits secondary to stroke with L sided hemiparesis             -patient may shower             -ELOS/Goals: 12-18 days, SPV PT/OT/SLP               - Note, multiple prior history of stroke on both Brilinta, Plavix, aspirin.  High risk for recurrence.   2.  Antithrombotics: -DVT/anticoagulation:  Pharmaceutical: Lovenox             -antiplatelet therapy: Aspirin 81 mg daily and Brilinta 90 mg BID   3. Pain Management: Tylenol as needed   4. Mood/Behavior/Sleep: LCSW to evaluate and provide emotional support -generalized anxiety disorder              -depression: continue Cymbalta             -antipsychotic agents: n/a   5. Neuropsych/cognition: This patient is capable of making decisions on her own behalf.   6. Skin/Wound Care: Routine skin care checks   7. Fluids/Electrolytes/Nutrition: Routine Is and Os and follow-up chemistries   8: Hypertension: monitor  TID and prn   - Poorly controlled, still in range for permissive hypertension. add as needed hydralazine for systolic BPs greater than 180.     - 3/18: Will start amlodipine 2.5 mg daily (was on 5 mg at baseline)    08/27/2022    2:55 PM 08/27/2022    6:46 AM 08/26/2022    8:48 PM  Vitals with BMI  Systolic [id number]  Diastolic 91 82 89  Pulse 79 78 75      9: Hyperlipidemia: continue statin   10: OSA: CPAP mask test>>passed first phase.  Will need to coordinate outpatient equipment/follow-up   11: Dizziness/vertigo: ? continue diazepam 5 mg q 4 hours prn (received two doses on Thursday and one on Friday). Antivert was initially given and stopped.             - ? vestibular eval ?              - Reportedly did not tolerate p.o. Valium on inpatient, however was notified by pharmacy that IV Valium and all other IV benzodiazepines are on backorder at the hospital with few doses left.  Replace regimen with Valium 5 mg every 8 hours as needed first-line, Valium IV 2.5 mg every 8 hours as needed second line.  3/17: Somewhat lethargic with p.o. Valium, dizziness did seem to respond well to it though, discontinue IV regimen and continue on 2 to 4 mg of p.o. Valium as needed   12: Asthma, chronic: home regimen: albuterol inhaler as needed, Singulair (not restarted)   13: DM2-controlled: CBGs q AC and q HS; home regimen includes Ozempic q 10 days             -trend CBGS and decide on carb modified diet, SSI, etc.    - Stable on admission labs, monitor   14: CKD IV: Baseline creatinine ~2.5-3.0             -follow-up BMP   - Uptrending BUN, creatinine stable, encourage p.o.  fluids and recheck Monday   15: Anemia of chronic disease: hemoglobin stable; follow-up CBC - stable    LOS: 1 days A FACE TO FACE EVALUATION WAS PERFORMED  Gertie Gowda 08/27/2022, 3:41 PM

## 2022-08-27 NOTE — Plan of Care (Signed)
Problem: RH Balance Goal: LTG: Patient will maintain dynamic sitting balance (OT) Description: LTG:  Patient will maintain dynamic sitting balance with assistance during activities of daily living (OT) Flowsheets (Taken 08/27/2022 1312) LTG: Pt will maintain dynamic sitting balance during ADLs with: Independent Goal: LTG Patient will maintain dynamic standing with ADLs (OT) Description: LTG:  Patient will maintain dynamic standing balance with assist during activities of daily living (OT)  Flowsheets (Taken 08/27/2022 1312) LTG: Pt will maintain dynamic standing balance during ADLs with: Independent with assistive device   Problem: Sit to Stand Goal: LTG:  Patient will perform sit to stand in prep for activites of daily living with assistance level (OT) Description: LTG:  Patient will perform sit to stand in prep for activites of daily living with assistance level (OT) Flowsheets (Taken 08/27/2022 1312) LTG: PT will perform sit to stand in prep for activites of daily living with assistance level: Independent with assistive device   Problem: RH Eating Goal: LTG Patient will perform eating w/assist, cues/equip (OT) Description: LTG: Patient will perform eating with assist, with/without cues using equipment (OT) Flowsheets (Taken 08/27/2022 1312) LTG: Pt will perform eating with assistance level of: Independent   Problem: RH Grooming Goal: LTG Patient will perform grooming w/assist,cues/equip (OT) Description: LTG: Patient will perform grooming with assist, with/without cues using equipment (OT) Flowsheets (Taken 08/27/2022 1312) LTG: Pt will perform grooming with assistance level of: Independent   Problem: RH Bathing Goal: LTG Patient will bathe all body parts with assist levels (OT) Description: LTG: Patient will bathe all body parts with assist levels (OT) Flowsheets (Taken 08/27/2022 1312) LTG: Pt will perform bathing with assistance level/cueing: Independent with assistive device  LTG:  Position pt will perform bathing: Shower   Problem: RH Dressing Goal: LTG Patient will perform upper body dressing (OT) Description: LTG Patient will perform upper body dressing with assist, with/without cues (OT). Flowsheets (Taken 08/27/2022 1312) LTG: Pt will perform upper body dressing with assistance level of: Independent Goal: LTG Patient will perform lower body dressing w/assist (OT) Description: LTG: Patient will perform lower body dressing with assist, with/without cues in positioning using equipment (OT) Flowsheets (Taken 08/27/2022 1312) LTG: Pt will perform lower body dressing with assistance level of: Minimal Assistance - Patient > 75%   Problem: RH Toileting Goal: LTG Patient will perform toileting task (3/3 steps) with assistance level (OT) Description: LTG: Patient will perform toileting task (3/3 steps) with assistance level (OT)  Flowsheets (Taken 08/27/2022 1312) LTG: Pt will perform toileting task (3/3 steps) with assistance level: Independent with assistive device   Problem: RH Simple Meal Prep Goal: LTG Patient will perform simple meal prep w/assist (OT) Description: LTG: Patient will perform simple meal prep with assistance, with/without cues (OT). Flowsheets (Taken 08/27/2022 1312) LTG: Pt will perform simple meal prep with assistance level of: Minimal Assistance - Patient > 75% LTG: Pt will perform simple meal prep w/level of: Ambulate with device   Problem: RH Toilet Transfers Goal: LTG Patient will perform toilet transfers w/assist (OT) Description: LTG: Patient will perform toilet transfers with assist, with/without cues using equipment (OT) Flowsheets (Taken 08/27/2022 1312) LTG: Pt will perform toilet transfers with assistance level of: Independent with assistive device   Problem: RH Tub/Shower Transfers Goal: LTG Patient will perform tub/shower transfers w/assist (OT) Description: LTG: Patient will perform tub/shower transfers with assist, with/without cues  using equipment (OT) Flowsheets (Taken 08/27/2022 1312) LTG: Pt will perform tub/shower stall transfers with assistance level of: Minimal Assistance - Patient > 75% LTG:  Pt will perform tub/shower transfers from: Tub/shower combination

## 2022-08-27 NOTE — Progress Notes (Signed)
Meredith Staggers, MD Physician Physical Medicine and Rehabilitation   Consult Note    Signed   Date of Service: 08/24/2022 10:39 AM  Related encounter: ED to Hosp-Admission (Discharged) from 08/22/2022 in [facility]   Signed     Expand All Collapse All  Show:Clear all [x] Written[x] Templated[] Copied  Added by: [x] Meredith Staggers, MD  [] Hover for details          Physical Medicine and Rehabilitation Consult Reason for Consult:left sided weakness Referring Physician: [place] Pink     HPI: Christina Orozco is a 52 y.o. female with a history of stage IV kidney disease, peripheral vascular disease and diabetes as well as previous hospitalizations for stroke, TIA, and seizures.  She has had multiple episodes of TIAs and strokes over the last 10 years with extensive workup.  She was on Brilinta and aspirin prior to admission.  She presented to [facility] emergency department on 08/22/2022 with complaints of dizziness.  While in the emergency room she developed left-sided weakness as well.  Workup included an MRI which noted punctate areas of acute/early subacute ischemia of the lateral margin of the left basal ganglia.  Neurology recommends aspirin and Brilinta for 4 weeks followed by aspirin alone as her risk factors seem adequately controlled.  She is being considered for the sleep smart study given she is at risk for sleep apnea.  With physical therapy yesterday she is min to mod assist for sit to stand transfers, +2.  Therapy notes ataxic like movements and increased unsteadiness and standing requiring assistance to stay upright.  Gait has not been tested yet.  Patient lives at home with her spouse and uses a cane for mobility prior to admission and was independent with her ADLs.     Review of Systems  Constitutional:  Negative for fever.  HENT:  Negative for hearing loss.   Eyes:  Positive for blurred vision and double vision.  Respiratory:  Negative  for cough.   Cardiovascular:  Negative for chest pain.  Gastrointestinal:  Positive for nausea and vomiting.  Genitourinary:  Negative for dysuria.  Musculoskeletal:  Negative for myalgias.  Skin:  Negative for rash.  Neurological:  Positive for dizziness, focal weakness, weakness and headaches.  Psychiatric/Behavioral:  Negative for suicidal ideas.         Past Medical History:  Diagnosis Date   Anemia      severe   Anxiety     Asthma     CKD (chronic kidney disease)      stage IV   DDD (degenerative disc disease), cervical     Depression     Diabetes mellitus (HCC)     Dislocated shoulder      right   GERD (gastroesophageal reflux disease)     Headache      migraines (about once a month)   History of kidney stones     Hypertension     Neuropathy     Peripheral vascular disease ([place])     Pneumonia     Stroke [facility])           Past Surgical History:  Procedure Laterality Date   ADENOIDECTOMY       APPENDECTOMY       CERVICAL SPINE SURGERY   06/2012    C5-C6   EYE SURGERY        left eye surgery    FOOT SURGERY       KENALOG INJECTION Left 03/10/2021  Procedure: Intravitreal Avastin Injection;  Surgeon: Sherlynn Stalls, MD;  Location: [place];  Service: Ophthalmology;  Laterality: Left;   LOOP RECORDER INSERTION N/A 08/27/2017    Procedure: LOOP RECORDER INSERTION;  Surgeon: Sanda Klein, MD;  Location: [place] CV LAB;  Service: Cardiovascular;  Laterality: N/A;   MEMBRANE PEEL Left 03/10/2021    Procedure: MEMBRANE PEEL;  Surgeon: Sherlynn Stalls, MD;  Location: [place];  Service: Ophthalmology;  Laterality: Left;   PARS PLANA VITRECTOMY Left 03/10/2021    Procedure: TWENTY-FIVE GAUGE PARS PLANA VITRECTOMY;  Surgeon: Sherlynn Stalls, MD;  Location: [place];  Service: Ophthalmology;  Laterality: Left;   PHOTOCOAGULATION WITH LASER Left 03/10/2021    Procedure: PHOTOCOAGULATION WITH LASER;  Surgeon: Sherlynn Stalls, MD;  Location: [place];  Service: Ophthalmology;  Laterality:  Left;   SHOULDER SURGERY       TEE WITHOUT CARDIOVERSION N/A 08/27/2017    Procedure: TRANSESOPHAGEAL ECHOCARDIOGRAM (TEE);  Surgeon: Sanda Klein, MD;  Location: [facility] ENDOSCOPY;  Service: Cardiovascular;  Laterality: N/A;   TONSILLECTOMY             Family History  Problem Relation Age of Onset   Vascular Disease Mother     CAD Mother     Heart failure Mother     Heart disease Other     Cancer Other          colon, stomach, pancreatic, lung   Diabetes Other     Breast cancer Sister 30   Seizures Maternal Uncle      Social History:  reports that she has never smoked. She has never used smokeless tobacco. She reports that she does not drink alcohol and does not use drugs. Allergies:       Allergies  Allergen Reactions   Lisinopril Swelling and Other (See Comments)      Facial swelling  angioedema   Penicillins Hives      Has patient had a PCN reaction causing immediate rash, facial/tongue/throat swelling, SOB or lightheadedness with hypotension: yes Has patient had a PCN reaction causing severe rash involving mucus membranes or skin necrosis: No Has patient had a PCN reaction that required hospitalization No Has patient had a PCN reaction occurring within the last 10 years: No If all of the above answers are "NO", then may proceed wit Rash on buttock that makes sitting very difficult   Quetiapine Other (See Comments)      Unknown    Gabapentin Swelling and Other (See Comments)      "I thought I was having another stroke." dizziness          Medications Prior to Admission  Medication Sig Dispense Refill   albuterol (PROAIR HFA) 108 (90 Base) MCG/ACT inhaler INHALE 1 OR 2 PUFFS INTO THE LUNGS EVERY 6 HOURS AS NEEDED FOR WHEEZING OR SHORTNESS OF BREATH (Patient taking differently: Inhale 2 puffs into the lungs as needed for shortness of breath or wheezing.) 8.5 g 0   amLODipine (NORVASC) 5 MG tablet TAKE 1 TABLET (5 MG TOTAL) BY MOUTH DAILY. (AM) (Patient taking differently:  Take 5 mg by mouth daily.) 90 tablet 1   aspirin EC 81 MG tablet Take 1 tablet (81 mg total) by mouth daily. 100 tablet 5   atorvastatin (LIPITOR) 80 MG tablet Take 80 mg by mouth at bedtime.       carvedilol (COREG) 12.5 MG tablet Take 1 tablet (12.5 mg total) by mouth 2 (two) times daily with a meal. 180 tablet 2  DULoxetine (CYMBALTA) 60 MG capsule Take 60 mg by mouth at bedtime.       montelukast (SINGULAIR) 10 MG tablet Take 10 mg by mouth at bedtime.       OZEMPIC, 0.25 OR 0.5 MG/DOSE, 2 MG/3ML SOPN Inject 0.5 mg into the skin See admin instructions. Inject 0.5 mg subcutaneous every 10 days       pantoprazole (PROTONIX) 40 MG tablet Take 1 tablet (40 mg total) by mouth daily. 30 tablet 0   ticagrelor (BRILINTA) 90 MG TABS tablet Take 90 mg by mouth in the morning and at bedtime.       acetaminophen (TYLENOL) 325 MG tablet Take 1-2 tablets (325-650 mg total) by mouth every 4 (four) hours as needed for mild pain. (Patient not taking: Reported on 08/24/2022)       azithromycin (ZITHROMAX) 250 MG tablet Take 1 tablet (250 mg total) by mouth daily. Take first 2 tablets together, then 1 every day until finished. (Patient not taking: Reported on 06/21/2022) 6 tablet 0   glucose blood (CONTOUR NEXT TEST) test strip 1 each by Other route 2 (two) times daily. And lancets 2/day (Patient taking differently: 1 each by Other route 2 (two) times daily.) 100 each 12   traZODone (DESYREL) 50 MG tablet Take 1 tablet (50 mg total) by mouth at bedtime as needed for sleep. (Patient not taking: Reported on 06/21/2022) 30 tablet 0      Home: Home Living Family/patient expects to be discharged to:: Private residence Living Arrangements: Spouse/significant other Available Help at Discharge: Family, Available PRN/intermittently Type of Home: Apartment Home Access: Level entry Home Layout: One level Bathroom Shower/Tub: Tub/shower unit, Architectural technologist: Handicapped height Bathroom Accessibility: Yes Home  Equipment: Conservation officer, nature (2 wheels), [employer] - single point, Civil engineer, contracting, Grab bars - tub/shower, Hand held shower head, Grab bars - toilet  Lives With: Daughter  Functional History: Prior Function Prior Level of Function : Needs assist Mobility Comments: Uses cane, limited community ambulator. received HHOT and HHRN ADLs Comments: able to perform ADLs and light IADLs Functional Status:  Mobility: Bed Mobility Overal bed mobility: Needs Assistance Bed Mobility: Supine to Sit, Sit to Supine Supine to sit: Supervision Sit to supine: Min assist General bed mobility comments: HOB elevated, use of gaze stabilization during transition, no physical assist. MinA for LE management back into bed Transfers Overall transfer level: Needs assistance Equipment used: 2 person hand held assist Transfers: Sit to/from Stand Sit to Stand: Min assist, Mod assist, +2 physical assistance General transfer comment: initially min A +2, pt with ataxic-like movements and increased unsteadiness in standing requiring mod A +2 to remain upright, increased dizziness with lateral weight shifting   ADL: ADL Overall ADL's : Needs assistance/impaired Eating/Feeding: Set up, Sitting Grooming: Set up, Sitting Upper Body Bathing: Sitting, Moderate assistance Lower Body Bathing: Sitting/lateral leans, Maximal assistance Upper Body Dressing : Moderate assistance, Sitting Lower Body Dressing: Maximal assistance, Sitting/lateral leans Toilet Transfer: Moderate assistance, +2 for safety/equipment, +2 for physical assistance Toileting- Clothing Manipulation and Hygiene: Moderate assistance Functional mobility during ADLs: Moderate assistance, +2 for physical assistance, Minimal assistance   Cognition: Cognition Overall Cognitive Status: Impaired/Different from baseline Arousal/Alertness: Awake/alert Orientation Level: Oriented X4 Year: 2024 Day of Week: Correct Attention: Sustained Sustained Attention: Appears  intact Memory: Impaired Memory Impairment: Storage deficit, Retrieval deficit (3/5 word recall) Awareness: Appears intact Problem Solving: Appears intact Safety/Judgment: Appears intact Cognition Arousal/Alertness: Awake/alert Behavior During Therapy: WFL for tasks assessed/performed Overall Cognitive Status: Impaired/Different from baseline  General Comments: WFL for basic mobility   Blood pressure (!) 173/79, pulse 79, temperature 98.4 F (36.9 C), temperature source Oral, resp. rate 16, height 5\' 2"  (1.575 m), weight 64.4 kg, last menstrual period 05/22/2018, SpO2 100 %. Physical Exam Constitutional:      General: She is not in acute distress.    Appearance: She is obese.  HENT:     Head: Normocephalic and atraumatic.     Nose: Nose normal.     Mouth/Throat:     Mouth: Mucous membranes are moist.  Eyes:     Pupils: Pupils are equal, round, and reactive to light.  Cardiovascular:     Rate and Rhythm: Normal rate.  Pulmonary:     Effort: Pulmonary effort is normal.  Abdominal:     Palpations: Abdomen is soft.  Musculoskeletal:        General: Normal range of motion.     Cervical back: Normal range of motion.  Skin:    Comments: A few tattoos  Neurological:     Comments: Alert and oriented x 3. Fairl insight and awareness. Craig Memory. Normal language. Measured speech, low volume, sl dysarthria. Delayed processing. . Cranial nerve exam unremarkable. 4-5 beats nystagmus with lateral gaze to left and right. RUE 5/5. LUE 4-/5 prox to 4+/5 distally. RLE 4- prox to 4+/5 distally. LLE 4-/5 prox to distal. Senses pain and light touch in all 4's. Decreased FMC LUE and LLE with ataxia/past pointing. DTR's 1+ right 2+ left. No abnl resting tone.   Psychiatric:     Comments: Pt flat but cooperative        Lab Results Last 24 Hours  No results found for this or any previous visit (from the past 24 hour(s)).    Imaging Results (Last 48 hours)  ECHOCARDIOGRAM COMPLETE   Result  Date: 08/23/2022    ECHOCARDIOGRAM REPORT   Patient Name:   Amannda Koob Date of Exam: 08/23/2022 Medical Rec #:  [id number]        Height:       62.0 in Accession #:    [id number]       Weight:       142.0 lb Date of Birth:  1971/05/25       BSA:          1.653 m Patient Age:    8 years         BP:           166/75 mmHg Patient Gender: F                HR:           87 bpm. Exam Location:  Inpatient Procedure: 2D Echo Indications:    TIA  History:        Patient has prior history of Echocardiogram examinations, most                 recent 06/21/2022. Stroke; Risk Factors:Diabetes, Hypertension                 and Dyslipidemia.  Sonographer:    Harvie Junior Referring Phys: [place] T TU IMPRESSIONS  1. Left ventricular ejection fraction, by estimation, is 65 to 70%. Left ventricular ejection fraction by 3D volume is 68 %. The left ventricle has normal function. The left ventricle has no regional wall motion abnormalities. There is moderate asymmetric left ventricular hypertrophy of the basal-septal segment (14 mm). Hypertrophied papillary muscles. Indeterminate diastolic filling due to E-A fusion.  2. Right ventricular systolic function is normal. The right ventricular size is normal.  3. A small pericardial effusion is present. The pericardial effusion is anterior to the right ventricle.  4. The mitral valve is normal in structure. Trivial mitral valve regurgitation. No evidence of mitral stenosis.  5. The aortic valve is tricuspid. Aortic valve regurgitation is not visualized. No aortic stenosis is present. Conclusion(s)/Recommendation(s): No intracardiac source of embolism detected on this transthoracic study. Consider a transesophageal echocardiogram to exclude cardiac source of embolism if clinically indicated. FINDINGS  Left Ventricle: Left ventricular ejection fraction, by estimation, is 65 to 70%. Left ventricular ejection fraction by 3D volume is 68 %. The left ventricle has normal function. The left  ventricle has no regional wall motion abnormalities. The left ventricular internal cavity size was normal in size. There is moderate asymmetric left ventricular hypertrophy of the basal-septal segment. Indeterminate diastolic filling due to E-A fusion. Right Ventricle: The right ventricular size is normal. No increase in right ventricular wall thickness. Right ventricular systolic function is normal. Left Atrium: Left atrial size was normal in size. Right Atrium: Right atrial size was normal in size. Pericardium: A small pericardial effusion is present. The pericardial effusion is anterior to the right ventricle. Mitral Valve: Chordal SAM. The mitral valve is normal in structure. Trivial mitral valve regurgitation. No evidence of mitral valve stenosis. Tricuspid Valve: The tricuspid valve is normal in structure. Tricuspid valve regurgitation is trivial. No evidence of tricuspid stenosis. Aortic Valve: The aortic valve is tricuspid. Aortic valve regurgitation is not visualized. No aortic stenosis is present. Aortic valve mean gradient measures 3.0 mmHg. Aortic valve peak gradient measures 5.3 mmHg. Aortic valve area, by VTI measures 2.26 cm. Pulmonic Valve: The pulmonic valve was normal in structure. Pulmonic valve regurgitation is trivial. No evidence of pulmonic stenosis. Aorta: The aortic root is normal in size and structure. Venous: The inferior vena cava was not well visualized. IAS/Shunts: The interatrial septum was not well visualized.  LEFT VENTRICLE PLAX 2D LVIDd:         3.80 cm         Diastology LVIDs:         2.20 cm         LV e' medial:    5.98 cm/s LV PW:         1.10 cm         LV E/e' medial:  15.0 LV IVS:        1.00 cm         LV e' lateral:   6.42 cm/s LVOT diam:     2.00 cm         LV E/e' lateral: 14.0 LV SV:         51 LV SV Index:   31 LVOT Area:     3.14 cm        3D Volume EF                                LV 3D EF:    Left                                             ventricul LV Volumes  (MOD)  ar LV vol d, MOD    76.0 ml                    ejection A2C:                                        fraction LV vol d, MOD    110.0 ml                   by 3D A4C:                                        volume is LV vol s, MOD    27.0 ml                    68 %. A2C: LV vol s, MOD    48.1 ml A4C:                           3D Volume EF: LV SV MOD A2C:   49.0 ml       3D EF:        68 % LV SV MOD A4C:   110.0 ml      LV EDV:       102 ml LV SV MOD BP:    52.8 ml       LV ESV:       32 ml                                LV SV:        70 ml RIGHT VENTRICLE RV Basal diam:  2.40 cm RV Mid diam:    1.90 cm RV S prime:     16.50 cm/s TAPSE (M-mode): 2.2 cm LEFT ATRIUM             Index        RIGHT ATRIUM           Index LA diam:        3.20 cm 1.94 cm/m   RA Area:     10.90 cm LA Vol (A2C):   21.4 ml 12.95 ml/m  RA Volume:   23.10 ml  13.98 ml/m LA Vol (A4C):   27.8 ml 16.82 ml/m LA Biplane Vol: 26.7 ml 16.16 ml/m  AORTIC VALVE                    PULMONIC VALVE AV Area (Vmax):    2.36 cm     PV Vmax:       0.93 m/s AV Area (Vmean):   2.40 cm     PV Peak grad:  3.5 mmHg AV Area (VTI):     2.26 cm AV Vmax:           115.50 cm/s AV Vmean:          77.800 cm/s AV VTI:            0.224 m AV Peak Grad:      5.3 mmHg AV Mean Grad:      3.0 mmHg LVOT Vmax:         86.77 cm/s LVOT Vmean:  59.333 cm/s LVOT VTI:          0.161 m LVOT/AV VTI ratio: 0.72  AORTA Ao Root diam: 3.10 cm MITRAL VALVE                TRICUSPID VALVE MV Area (PHT): 6.32 cm     TR Peak grad:   19.0 mmHg MV Decel Time: 120 msec     TR Vmax:        218.00 cm/s MR Peak grad: 25.4 mmHg MR Vmax:      252.00 cm/s   SHUNTS MV E velocity: 89.70 cm/s   Systemic VTI:  0.16 m MV A velocity: 105.00 cm/s  Systemic Diam: 2.00 cm MV E/A ratio:  0.85 Cherlynn Kaiser MD Electronically signed by Cherlynn Kaiser MD Signature Date/Time: 08/23/2022/3:55:54 PM    Final     MR BRAIN WO CONTRAST   Result Date: 08/22/2022 CLINICAL  DATA:  Acute neurologic deficit EXAM: MRI HEAD WITHOUT CONTRAST TECHNIQUE: Multiplanar, multiecho pulse sequences of the brain and surrounding structures were obtained without intravenous contrast. COMPARISON:  06/21/2022 FINDINGS: Brain: Punctate focus of acute/early subacute ischemia at the lateral margin of the left basal ganglia. Multiple chronic microhemorrhages in a predominantly central distribution. There is multifocal hyperintense T2-weighted signal within the white matter. Parenchymal volume and CSF spaces are normal. 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 The midline structures are normal. Vascular: Normal flow voids. Skull and upper cervical spine: Normal marrow signal. Sinuses/Orbits: Bilateral mastoid effusions. Normal orbits and paranasal sinuses. Other: None IMPRESSION: 1. Punctate focus of acute/early subacute ischemia at the lateral margin of the left basal ganglia. No acute hemorrhage or mass effect. 2. Multiple chronic microhemorrhages in a predominantly central distribution, consistent with chronic hypertensive angiopathy. 3. 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 Electronically Signed   By: Ulyses Jarred M.D.   On: 08/22/2022 19:39    CT ANGIO HEAD NECK W WO CM (CODE STROKE)   Result Date: 08/22/2022 CLINICAL DATA:  Stroke suspected EXAM: CT ANGIOGRAPHY HEAD AND NECK TECHNIQUE: Multidetector CT imaging of the head and neck was performed using the standard protocol during bolus administration of intravenous contrast. Multiplanar CT image reconstructions and MIPs were obtained to evaluate the vascular anatomy. Carotid stenosis measurements (when applicable) are obtained utilizing NASCET criteria, using the distal internal carotid diameter as the denominator. RADIATION DOSE REDUCTION: This exam was performed according to the departmental dose-optimization program which includes automated exposure control, adjustment of the mA and/or kV according to patient size and/or use of  iterative reconstruction technique. CONTRAST:  25mL OMNIPAQUE IOHEXOL 350 MG/ML SOLN COMPARISON:  Same day CT head, MRI Head/MRA head 03/22/23 FINDINGS: CT HEAD FINDINGS See same day CT brain for intracranial findings. No contrast enhancing lesion is visualized. CTA NECK FINDINGS Aortic arch: Standard branching. Imaged portion shows no evidence of aneurysm or dissection. No significant stenosis of the major arch vessel origins. Right carotid system: No evidence of dissection, stenosis (50% or greater), or occlusion. Left carotid system: No evidence of dissection, stenosis (50% or greater), or occlusion. Vertebral arteries: Left dominant. No evidence of dissection, stenosis (50% or greater), or occlusion. Skeleton: Status post C5-C6 ACDF Other neck: Negative. Upper chest: Negative. Review of the MIP images confirms the above findings CTA HEAD FINDINGS Anterior circulation: No significant stenosis, proximal occlusion, aneurysm, or vascular malformation. Posterior circulation: No significant stenosis, proximal occlusion, aneurysm, or vascular malformation. Venous sinuses: As permitted by contrast timing, patent. Anatomic variants: None Review of the MIP images confirms the  above findings IMPRESSION: 1. No intracranial large vessel occlusion or significant stenosis. 2. No hemodynamically significant stenosis in the neck. Electronically Signed   By: Marin Roberts M.D.   On: 08/22/2022 17:23    CT HEAD CODE STROKE WO CONTRAST   Result Date: 08/22/2022 CLINICAL DATA:  Code stroke.  Neuro deficit, acute, stroke suspected EXAM: CT HEAD WITHOUT CONTRAST TECHNIQUE: Contiguous axial images were obtained from the base of the skull through the vertex without intravenous contrast. RADIATION DOSE REDUCTION: This exam was performed according to the departmental dose-optimization program which includes automated exposure control, adjustment of the mA and/or kV according to patient size and/or use of iterative reconstruction  technique. COMPARISON:  None Available. FINDINGS: Brain: Remote bilateral thalamic lacunar infarcts. Remote probable perforator infarct in the right internal capsule and bilateral corona radiata. No evidence of acute large vascular territory infarct. Additional patchy white matter hypodensities, nonspecific but compatible with chronic microvascular ischemic disease. No evidence of acute hemorrhage, mass lesion midline shift or hydrocephalus. Vascular: No hyperdense vessel identified. Skull: No acute fracture. Sinuses/Orbits: Clear sinuses.  No acute orbital findings. Other: Small left mastoid effusion. ASPECTS [facility] Stroke Program Early CT Score) total score (0-10 with 10 being normal): 10. IMPRESSION: 1. No evidence of acute large vascular territory infarct or acute hemorrhage. ASPECTS is 10. 2. Multiple remote lacunar infarcts and chronic microvascular ischemic disease. Code stroke imaging results were communicated on 08/22/2022 at 4:59 pm to provider Dr. Lorrin Goodell via secure text paging. Electronically Signed   By: Margaretha Sheffield M.D.   On: 08/22/2022 16:59       Assessment/Plan: Diagnosis: Right hemispheric TIA/CVA with silent left basal ganglia infarct.She has a history of numerous prior TIAs and strokes.  Patient with persistent left-sided weakness as well as ataxia with substantial balance impairment and cognitive deficits. Does the need for close, 24 hr/day medical supervision in concert with the patient's rehab needs make it unreasonable for this patient to be served in a less intensive setting? Yes Co-Morbidities requiring supervision/potential complications:  -Hypertension -Diabetes type 2 -Depression Due to bladder management, bowel management, safety, skin/wound care, disease management, medication administration, pain management, and patient education, does the patient require 24 hr/day rehab nursing? Yes Does the patient require coordinated care of a physician, rehab nurse, therapy  disciplines of PT, OT, SLP to address physical and functional deficits in the context of the above medical diagnosis(es)? Yes Addressing deficits in the following areas: balance, endurance, locomotion, strength, transferring, bowel/bladder control, bathing, dressing, feeding, grooming, toileting, cognition, speech, and psychosocial support Can the patient actively participate in an intensive therapy program of at least 3 hrs of therapy per day at least 5 days per week? Yes The potential for patient to make measurable gains while on inpatient rehab is excellent Anticipated functional outcomes upon discharge from inpatient rehab are supervision  with PT, supervision and min assist with OT, modified independent and supervision with SLP. Estimated rehab length of stay to reach the above functional goals is: 12-18 days Anticipated discharge destination: Home Overall Rehab/Functional Prognosis: excellent   POST ACUTE RECOMMENDATIONS: This patient's condition is appropriate for continued rehabilitative care in the following setting: CIR Patient has agreed to participate in recommended program. Yes Note that insurance prior authorization may be required for reimbursement for recommended care.   Comment: Need to confirm who can help her at home. Has 2 daughters, one of which is in college. Pt is motivated to return home. Rehab Admissions Coordinator to follow up  I have personally performed a face to face diagnostic evaluation of this patient. Additionally, I have examined the patient's medical record including any pertinent labs and radiographic images. If the physician assistant has documented in this note, I have reviewed and edited or otherwise concur with the physician assistant's documentation.   Thanks,   Meredith Staggers, MD 08/24/2022          Routing History

## 2022-08-27 NOTE — Progress Notes (Signed)
Gertie Gowda, DO  Physician Physical Medicine and Rehabilitation   PMR Pre-admission    Signed   Date of Service: 08/25/2022  8:07 AM  Related encounter: ED to Hosp-Admission (Discharged) from 08/22/2022 in [facility]   Signed      Show:Clear all [x] Written[x] Templated[x] Copied  Added by: [x] Julious Payer Vertis Kelch, RN  [] Hover for details PMR Admission Coordinator Pre-Admission Assessment   Patient: Christina Orozco is an 52 y.o., female MRN: [id number] DOB: 1971/05/15 Height: 5\' 2"  (157.5 cm) Weight: 64.4 kg                                                                                                                                               Insurance Information HMO: yes    PPO:      PCP:      IPA:      80/20:      OTHER:  PRIMARY: [place] Medicare Dual complete      Policy#: [id number]      Subscriber: pt Medicare #([id number]) CM Name: Ubaldo Glassing      Phone#: [phone] option #7     Fax#: [phone] Pre-Cert#: [id number] approved 08/25/22 until 08/31/22      Employer:  Benefits:  Phone #: [phone]     Name: 3/14 Eff. Date: 06/12/22     Deduct: $240      Out of Pocket Max: [phone]      Life Max: none  CIR: $1730 co pay per admission      SNF: no co pay per day days 1 until 20; $204 co pay per day days 21 until 100 Outpatient: 80%     Co-Pay: 20% Home Health: 100%      Co-Pay: visits per medical neccesity DME: 80%     Co-Pay: 20% Providers: in network  SECONDARY: Medicaid [place] [employer]      Policy#: [id number] k [id number] active per passport one source [place] Counselor:       Phone#:    The "Data Collection Information Summary" for patients in Inpatient Rehabilitation Facilities with attached "Privacy Act [place] Records" was provided and verbally reviewed with: Patient   Emergency Contact Information Contact Information       Name Relation Home Work Mobile    Done,[name] Brother [phone]         GIll,Anitraous Daughter [phone]   [phone]         Current Medical History  Patient Admitting Diagnosis: CVA   History of Present Illness:  52 y.o. female with a history of stage IV kidney disease, peripheral vascular disease and diabetes as well as previous hospitalizations for stroke, TIA, and seizures.  She has had multiple episodes of TIA's and strokes over the last 10 years with extensive workup.  She was on Brilinta and aspirin prior to admission.  She presented to  [facility] emergency department on 08/22/2022 with complaints of dizziness.  While in the emergency room she developed left-sided weakness as well.     Workup included an MRI which noted punctate areas of acute/early subacute ischemia of the lateral margin of the left basal ganglia.  Neurology recommends aspirin and Brilinta for 4 weeks followed by aspirin alone as her risk factors seem adequately controlled.  She is being considered for the sleep smart study given she is at risk for sleep apnea.     She developed acute vertigo with horizontal nystagmus/nausea and vomiting. No improvement with Antivert  or Reglan. Did respond with Valium,. Holding Coreg and amlodipine to allow for permissive HTN. Baseline Creatinine appears to be around 2 to 3. Continue Duloxetine and Trazodone for generalized anxiety disorder. Hgb A1c 6.3 with longterm use of insulin.    Complete NIHSS TOTAL: 1 Glasgow Coma Scale Score: 15   Patient[facility] has been reviewed by the rehabilitation admission coordinator and physician.   Past Medical History      Past Medical History:  Diagnosis Date   Anemia      severe   Anxiety     Asthma     CKD (chronic kidney disease)      stage IV   DDD (degenerative disc disease), cervical     Depression     Diabetes mellitus (HCC)     Dislocated shoulder      right   GERD (gastroesophageal reflux disease)     Headache      migraines (about once a month)    History of kidney stones     Hypertension     Neuropathy     Peripheral vascular disease ([place])     Pneumonia     Stroke [facility])      Has the patient had major surgery during 100 days prior to admission? No   Family History  family history includes Breast cancer (age of onset: 80) in her sister; CAD in her mother; Cancer in an other family member; Diabetes in an other family member; Heart disease in an other family member; Heart failure in her mother; Seizures in her maternal uncle; Vascular Disease in her mother.   Current Medications    Current Facility-Administered Medications:    acetaminophen (TYLENOL) tablet 650 mg, 650 mg, Oral, Q4H PRN, 650 mg at 08/24/22 1646 **OR** acetaminophen (TYLENOL) 160 MG/5ML solution 650 mg, 650 mg, Per Tube, Q4H PRN **OR** acetaminophen (TYLENOL) suppository 650 mg, 650 mg, Rectal, Q4H PRN, Tu, Ching T, DO   aspirin EC tablet 81 mg, 81 mg, Oral, Daily, Tu, Ching T, DO, 81 mg at 08/25/22 1003   atorvastatin (LIPITOR) tablet 80 mg, 80 mg, Oral, QHS, Tu, Ching T, DO, 80 mg at 08/24/22 2119   diazepam (VALIUM) injection 5 mg, 5 mg, Intravenous, Q6H PRN, Annita Brod, MD   ondansetron [facility]) injection 4 mg, 4 mg, Intravenous, Q6H PRN, Tu, Ching T, DO, 4 mg at 08/24/22 1646   ticagrelor (BRILINTA) tablet 90 mg, 90 mg, Oral, BID, Tu, Ching T, DO, 90 mg at 08/25/22 1003   Patients Current Diet:  Diet Order                  Diet regular Room service appropriate? No; Fluid consistency: Thin  Diet effective now                       Precautions /  Restrictions Precautions Precautions: Fall Restrictions Weight Bearing Restrictions: No    Has the patient had 2 or more falls or a fall with injury in the past year?Yes   Prior Activity Level Limited Community (1-2x/wk): uses cane, limited community   Prior Functional Level Prior Function Prior Level of Function : Needs assist Mobility Comments: Uses cane, limited community ambulator. received  HHOT and HHRN ADLs Comments: able to perform ADLs and light IADLs   Self Care: Did the patient need help bathing, dressing, using the toilet or eating?  Independent   Indoor Mobility: Did the patient need assistance with walking from room to room (with or without device)? Independent   Stairs: Did the patient need assistance with internal or external stairs (with or without device)? Needed some help   Functional Cognition: Did the patient need help planning regular tasks such as shopping or remembering to take medications? Needed some help   Patient Information Are you of Hispanic, Latino/a,or Spanish origin?: A. No, not of Hispanic, Latino/a, or Spanish origin What is your race?: B. Black or African American Do you need or want an interpreter to communicate with a doctor or health care staff?: 0. No   Patient's Response To:  Health Literacy and Transportation Is the patient able to respond to health literacy and transportation needs?: Yes Health Literacy - How often do you need to have someone help you when you read instructions, pamphlets, or other written material from your doctor or pharmacy?: Sometimes In the past 12 months, has lack of transportation kept you from medical appointments or from getting medications?: No In the past 12 months, has lack of transportation kept you from meetings, work, or from getting things needed for daily living?: No   Home Assistive Devices / [place] Devices/Equipment: Radio producer (specify quad or straight), Walker (specify type) Home Equipment: Conservation officer, nature (2 wheels), [employer] - single point, Shower seat, Grab bars - tub/shower, Hand held shower head, Grab bars - toilet   Prior Device Use: Indicate devices/aids used by the patient prior to current illness, exacerbation or injury? Cane and RW   Current Functional Level Cognition   Arousal/Alertness: Awake/alert Overall Cognitive Status: Impaired/Different from baseline Orientation Level:  Oriented X4 General Comments: Patient slow to respond snd low voice following episode with PT earlier Attention: Sustained Sustained Attention: Appears intact Memory: Impaired Memory Impairment: Storage deficit, Retrieval deficit (3/5 word recall) Awareness: Appears intact Problem Solving: Appears intact Safety/Judgment: Appears intact    Extremity Assessment (includes Sensation/Coordination)   Upper Extremity Assessment: Overall WFL for tasks assessed  Lower Extremity Assessment: RLE deficits/detail, LLE deficits/detail RLE Deficits / Details: Strength 5/5 RLE Coordination: decreased gross motor LLE Deficits / Details: Strength 5/5 LLE Coordination: decreased gross motor     ADLs   Overall ADL's : Needs assistance/impaired Eating/Feeding: Set up, Sitting Grooming: Wash/dry hands, Wash/dry face, Supervision/safety, Bed level Upper Body Bathing: Sitting, Moderate assistance Lower Body Bathing: Sitting/lateral leans, Maximal assistance Upper Body Dressing : Moderate assistance, Sitting Lower Body Dressing: Maximal assistance, Sitting/lateral leans Lower Body Dressing Details (indicate cue type and reason): max assist to donn socks while seated on EOB Toilet Transfer: Moderate assistance, +2 for safety/equipment, +2 for physical assistance Toileting- Clothing Manipulation and Hygiene: Moderate assistance Functional mobility during ADLs: Moderate assistance, +2 for physical assistance, Minimal assistance General ADL Comments: Patient limited due to dizziness     Mobility   Overal bed mobility: Needs Assistance Bed Mobility: Supine to Sit, Sit to Supine Supine to sit: Mod  assist Sit to supine: HOB elevated, Mod assist, +2 for physical assistance, +2 for safety/equipment General bed mobility comments: mod assist x2 to manage BLEs and trunk due to Left lateral leaning     Transfers   Overall transfer level: Needs assistance Equipment used: 2 person hand held assist Transfers: Sit  to/from Stand Sit to Stand: Mod assist, +2 physical assistance, +2 safety/equipment General transfer comment: Pt cued to grab arms of therapists with face-to-face approach to stand from EOB, modAx2 for stability with transfer and stepping to the R to [facility] with noted LOB and modAx2 to recover.     Ambulation / Gait / Stairs / Wheelchair Mobility   Ambulation/Gait Ambulation/Gait assistance: Mod assist, +2 physical assistance, +2 safety/equipment Gait Distance (Feet): 2 Feet Assistive device: 2 person hand held assist Gait Pattern/deviations: Step-through pattern, Decreased step length - right, Decreased step length - left, Decreased stride length, Decreased weight shift to right, Ataxic, Narrow base of support, Trunk flexed, Knees buckling General Gait Details: Pt only taking a few steps to the R towards [facility] with bil HHA today for pt safety as pt had a change in presentation during session. ModAx2 for stability, to recover when pt's knees buckled, and to direct pt to the R. Gait velocity: reduced Gait velocity interpretation: <1.31 ft/sec, indicative of household ambulator     Posture / Balance Dynamic Sitting Balance Sitting balance - Comments: left lateral leaning while on EOB and able to correct with verbal and physical cues but return to lateral leaning. Lateral leaning on right elbow to address Balance Overall balance assessment: Needs assistance Sitting-balance support: Feet supported, No upper extremity supported, Single extremity supported, Feet unsupported Sitting balance-Leahy Scale: Poor Sitting balance - Comments: left lateral leaning while on EOB and able to correct with verbal and physical cues but return to lateral leaning. Lateral leaning on right elbow to address Postural control: Left lateral lean Standing balance support: Bilateral upper extremity supported, During functional activity Standing balance-Leahy Scale: Poor Standing balance comment: reliant on bilateral UE support  and external physical assistance     Special needs/care consideration Fall precautions Hgb A1c 6.3 Smart Sleep study SCAT and Aide form completed and sent by [facility] on 3/15    Previous Home Environment  Living Arrangements: Children (22 yo dtr lives with her; she goes to [employer] state)  Lives With: Daughter Available Help at Discharge: Family, Available 24 hours/day Type of Home: Apartment Home Layout: One level Home Access: Level entry Bathroom Shower/Tub: Tub/shower unit, Architectural technologist: Handicapped height Bathroom Accessibility: Yes Home Care Services: Yes Type of Home Care Services: Home OT, [place] (if known): [facility] Additional Comments: trying to get a PCA back   Discharge Living Setting Plans for Discharge Living Setting: Apartment, Lives with (comment) (63 year old daughter) Type of Home at Discharge: Apartment Discharge Home Layout: One level Discharge Home Access: Level entry Discharge Bathroom Shower/Tub: Tub/shower unit Discharge Bathroom Toilet: Handicapped height Discharge Bathroom Accessibility: Yes How Accessible: Accessible via walker Does the patient have any problems obtaining your medications?: No   Social/Family/Support Systems Patient Roles: Parent Contact Information: brother and daughters Anticipated Caregiver: daughters and brother Anticipated Caregiver's Contact Information: see contacts Ability/Limitations of Caregiver: 27 year old daughter onsite at [employer] state on Wednesdays, older dtr works Banker at [employer], brother asissting in arranging 24/7 assist Caregiver Availability: 24/7 Discharge Plan Discussed with Primary Caregiver: Yes Is Caregiver In Agreement with Plan?: Yes Does Caregiver/Family have Issues with  Lodging/Transportation while Pt is in Rehab?: No   Acute therapy evals mentions spouse/significant other; I clarified that her former partner of 26 years moved out in 2020.     Goals Patient/Family Goal for Rehab: Mod I to intermittent supervision with PT, OT and SLP at wheelchair level Expected length of stay: ELOS 12- 18 days Additional Information: Has been to CIR previously Pt/Family Agrees to Admission and willing to participate: Yes Program Orientation Provided & Reviewed with Pt/Caregiver Including Roles  & Responsibilities: Yes Additional Information Needs: Has seen Dr Letta Pate in outpatient clinic  Barriers to Discharge: Decreased caregiver support   Decrease burden of Care through IP rehab admission: n/a   Possible need for SNF placement upon discharge:patient states she will not go SNF   Patient Condition: This patient's medical and functional status has changed since the consult dated: 08/24/2022 in which the Rehabilitation Physician determined and documented that the patient's condition is appropriate for intensive rehabilitative care in an inpatient rehabilitation facility. See "History of Present Illness" (above) for medical update. Functional changes are: overall mod assist. Patient's medical and functional status update has been discussed with the Rehabilitation physician and patient remains appropriate for inpatient rehabilitation. Will admit to inpatient rehab on 08/26/22 when bed is available.   Preadmission Screen Completed By:  Cleatrice Burke, RN, MSN 08/25/2022 2:51 PM ______________________________________________________________________   Discussed status with Dr. Tressa Busman on 08/25/22 at 1215 and received approval for admission 08/26/22 when bed is available.   Admission Coordinator:  Cleatrice Burke, time [id number] Date 08/25/2022            Revision History

## 2022-08-28 ENCOUNTER — Encounter (HOSPITAL_COMMUNITY): Payer: Self-pay

## 2022-08-28 MED ORDER — DIAZEPAM 2 MG PO TABS: 2.0000 mg | ORAL_TABLET | Freq: Three times a day (TID) | ORAL | Status: DC | PRN

## 2022-08-28 NOTE — Progress Notes (Signed)
Patient ID: Christina Orozco, female   DOB: [birth year], 53 y.o.   MRN: [id number]  Sw received phone call from pt's brother, Jeneen Rinks with medical questions. Requesting a phone call from the physician. If unable, brother will be present on Wednesday after 12 PM. Sw will inform physician, no additional questions or concerns.

## 2022-08-28 NOTE — IPOC Note (Signed)
Overall Plan of Care [facility]) Patient Details Name: Christina Orozco MRN: [id number] DOB: [birth year]  Admitting Diagnosis: CVA (cerebral vascular accident) [facility])  Hospital Problems: Principal Problem:   CVA (cerebral vascular accident) ([place])     Functional Problem List: Nursing Bowel, Safety, Endurance, Medication Management, Pain  PT Balance, Endurance, Motor, Safety  OT Balance, Motor, Cognition, Vision, Endurance, Safety  SLP Cognition, Nutrition  TR         Basic ADL's: OT Eating, Grooming, Bathing, Dressing, Toileting     Advanced  ADL's: OT Simple Meal Preparation     Transfers: PT Bed Mobility, Bed to Chair, Car  OT Toilet, Tub/Shower     Locomotion: PT Ambulation     Additional Impairments: OT Fuctional Use of Upper Extremity  SLP Swallowing, Social Cognition   Memory  TR      Anticipated Outcomes Item Anticipated Outcome  Self Feeding Mod I  Swallowing  mod I   Basic self-care  Intermittent min A  Toileting  Mod I   Bathroom Transfers Mod I  Bowel/Bladder  manage bowel w mod I assist  Transfers  ModI  Locomotion  sup + LRAD  Communication     Cognition  supervision  Pain  < 4 with prns  Safety/Judgment  manage w cues   Therapy Plan: PT Intensity: Minimum of 1-2 x/day ,45 to 90 minutes PT Frequency: 5 out of 7 days PT Duration Estimated Length of Stay: 10 days OT Intensity: Minimum of 1-2 x/day, 45 to 90 minutes OT Frequency: 5 out of 7 days OT Duration/Estimated Length of Stay: 10-12 days SLP Intensity: Minumum of 1-2 x/day, 30 to 90 minutes SLP Frequency: 1 to 3 out of 7 days SLP Duration/Estimated Length of Stay: 10-12 days   Team Interventions: Nursing Interventions Pain Management, Bowel Management, Medication Management, Disease Management/Prevention, Patient/Family Education, Discharge Planning  PT interventions Ambulation/gait training, Training and development officer, Cognitive remediation/compensation, Discharge planning,  Disease management/prevention, DME/adaptive equipment instruction, Functional mobility training, Pain management, Patient/family education, Stair training, Therapeutic Activities, Therapeutic Exercise, UE/LE Strength taining/ROM, UE/LE Coordination activities  OT Interventions Training and development officer, Community reintegration, Disease mangement/prevention, Neuromuscular re-education, Patient/family education, Self Care/advanced ADL retraining, Therapeutic Exercise, UE/LE Coordination activities, Visual/perceptual remediation/compensation, UE/LE Strength taining/ROM, Therapeutic Activities, Functional mobility training, DME/adaptive equipment instruction, Discharge planning, Cognitive remediation/compensation  SLP Interventions Cognitive remediation/compensation, Cueing hierarchy, Internal/external aids, Patient/family education, Dysphagia/aspiration precaution training  TR Interventions    SW/CM Interventions Discharge Planning, Psychosocial Support, Patient/Family Education   Barriers to Discharge MD  Medical stability  Nursing Decreased caregiver support 1 level/level entry w daughter; 50 year old daughter onsite at [employer] state on Wednesdays, older dtr works Banker at [employer], brother asissting in arranging 24/7 assist  PT Other (comments) dizziness  OT      SLP      SW       Team Discharge Planning: Destination: PT-Home ,OT- Home , SLP-Home Projected Follow-up: PT-Home health PT, OT-  Outpatient OT, Home health OT, SLP-Outpatient SLP Projected Equipment Needs: PT-To be determined, OT- To be determined, SLP-None recommended by SLP Equipment Details: PT-pt has RW, OT-  Patient/family involved in discharge planning: PT- Patient,  OT-Patient, SLP-Patient  MD ELOS: 10-12d Medical Rehab Prognosis:  Good Assessment: The patient has been admitted for CIR therapies with the diagnosis of CVA. The team will be addressing functional mobility, strength, stamina, balance, safety,  adaptive techniques and equipment, self-care, bowel and bladder mgt, patient and caregiver education, uncontrolled HTN. Goals have been set at ModI/Sup. Anticipated discharge  destination is home.        See Team Conference Notes for weekly updates to the plan of care

## 2022-08-28 NOTE — Progress Notes (Signed)
PROGRESS NOTE   Subjective/Complaints:  Discussed dizziness, not related to position changes , pt states onset was last Tues around onset of CVA  ROS: Has LLE weakness, no double vision, no numbness, no abd discomfort or breathing issues  Objective:   No results found. Recent Labs    08/26/22 1509  WBC 6.4  HGB 9.9*  HCT 30.3*  PLT 222    Recent Labs    08/26/22 1509  NA 141  K 4.1  CL 111  CO2 23  GLUCOSE 89  BUN 42*  CREATININE 3.06*  CALCIUM 9.2     Intake/Output Summary (Last 24 hours) at 08/28/2022 0940 Last data filed at 08/28/2022 0902 Gross per 24 hour  Intake 598 ml  Output --  Net 598 ml         Physical Exam: Vital Signs Blood pressure (!) 147/79, pulse 77, temperature 97.9 F (36.6 C), temperature source Oral, resp. rate 18, height 5\' 2"  (1.575 m), weight 68.8 kg, last menstrual period 05/22/2018, SpO2 100 %.  General: No acute distress Mood and affect are appropriate Heart: Regular rate and rhythm no rubs murmurs or extra sounds Lungs: Clear to auscultation, breathing unlabored, no rales or wheezes Abdomen: Positive bowel sounds, soft nontender to palpation, nondistended Extremities: No clubbing, cyanosis, or edema Skin: No evidence of breakdown, no evidence of rash   MSK:      No apparent deformity.      Strength: Right upper and lower extremity 5 out of 5 throughout.  Left  4-/5 in upper, 4+/5 in lower.   Neurologic exam:  Cognition: AAO to person, place, time and event. + Mildly lethargic   Language: Fluent, No substitutions or neoglisms.  Mild dysarthria. Names 3/3 objects correctly.  Memory: Recalls 3/3 objects at 5 minutes. No apparent deficits  Insight: Good  insight into current condition.  Mood: Pleasant affect, appropriate mood.  Sensation: To light touch intact in BL UEs and LEs  Reflexes: 2+ in BL UE and LEs.  Positive Hoffmann sign in left upper extremity.  Negative  Babinski. CN: Positive mild right decreased nasolabial fold.  Otherwise, intact. Coordination: Left upper extremity ataxia on FTN  Spasticity: MAS 0 in all extremities.          Assessment/Plan: 1. Functional deficits which require 3+ hours per day of interdisciplinary therapy in a comprehensive inpatient rehab setting. Physiatrist is providing close team supervision and 24 hour management of active medical problems listed below. Physiatrist and rehab team continue to assess barriers to discharge/monitor patient progress toward functional and medical goals  Care Tool:  Bathing    Body parts bathed by patient: Right arm, Left arm, Chest, Abdomen, Front perineal area, Buttocks, Right upper leg, Left upper leg, Right lower leg, Left lower leg, Face         Bathing assist Assist Level: Minimal Assistance - Patient > 75%     Upper Body Dressing/Undressing Upper body dressing   What is the patient wearing?: Bra, Pull over shirt    Upper body assist Assist Level: Minimal Assistance - Patient > 75%    Lower Body Dressing/Undressing Lower body dressing      What is  the patient wearing?: Underwear/pull up, Pants     Lower body assist Assist for lower body dressing: Minimal Assistance - Patient > 75%     Toileting Toileting Toileting Activity did not occur (Clothing management and hygiene only): N/A (no void or bm)  Toileting assist       Transfers Chair/bed transfer  Transfers assist     Chair/bed transfer assist level: Minimal Assistance - Patient > 75%     Locomotion Ambulation   Ambulation assist      Assist level: Moderate Assistance - Patient 50 - 74% Assistive device: Walker-rolling Max distance: 9ft   Walk 10 feet activity   Assist  Walk 10 feet activity did not occur: Safety/medical concerns (2/2 dizziness)        Walk 50 feet activity   Assist Walk 50 feet with 2 turns activity did not occur: Safety/medical concerns (2/2 dizziness)          Walk 150 feet activity   Assist Walk 150 feet activity did not occur: Safety/medical concerns (2/2 dizziness)         Walk 10 feet on uneven surface  activity   Assist Walk 10 feet on uneven surfaces activity did not occur: Safety/medical concerns (2/2 dizziness)         Wheelchair     Assist Is the patient using a wheelchair?: No   Wheelchair activity did not occur: N/A         Wheelchair 50 feet with 2 turns activity    Assist    Wheelchair 50 feet with 2 turns activity did not occur: N/A       Wheelchair 150 feet activity     Assist  Wheelchair 150 feet activity did not occur: N/A       Blood pressure (!) 147/79, pulse 77, temperature 97.9 F (36.6 C), temperature source Oral, resp. rate 18, height 5\' 2"  (1.575 m), weight 68.8 kg, last menstrual period 05/22/2018, SpO2 100 %.   Medical Problem List and Plan: 1. Functional deficits secondary to stroke with L sided hemiparesis She presented to [facility] on 12/19/21 with left-sided weakness and visual deficits and MRI/MRA brain done revealing 1.9 cm acute ischemic nonhemorrhagic infarct in the deep white matter right frontal centrum ovale and advanced chronic microvascular disease.   New CVA 3/12 Left BG infarct               -patient may shower             -ELOS/Goals: 12-18 days, SPV PT/OT/SLP               - Note, multiple prior history of stroke on both Brilinta, Plavix, aspirin.  High risk for recurrence.   2.  Antithrombotics: -DVT/anticoagulation:  Pharmaceutical: Lovenox             -antiplatelet therapy: Aspirin 81 mg daily and Brilinta 90 mg BID   3. Pain Management: Tylenol as needed   4. Mood/Behavior/Sleep: LCSW to evaluate and provide emotional support -generalized anxiety disorder             -depression: continue Cymbalta             -antipsychotic agents: n/a   5. Neuropsych/cognition: This patient is capable of making decisions on her own behalf.    6. Skin/Wound Care: Routine skin care checks   7. Fluids/Electrolytes/Nutrition: Routine Is and Os and follow-up chemistries   8: Hypertension: monitor TID and prn   - Poorly controlled,  still in range for permissive hypertension. add as needed hydralazine for systolic BPs greater than 180.     - 3/18: Will start amlodipine 2.5 mg daily (was on 5 mg at baseline)    08/28/2022    6:09 AM 08/28/2022    3:33 AM 08/27/2022    7:39 PM  Vitals with BMI  Systolic [id number]  Diastolic 79 86 85  Pulse 77 72 91   Cont permissive HTN   9: Hyperlipidemia: continue statin   10: OSA: CPAP mask test>>passed first phase.  Will need to coordinate outpatient equipment/follow-up   11: Dizziness/vertigo: ? continue diazepam 5 mg q 4 hours prn (received two doses on Thursday and one on Friday). Antivert was initially given and stopped.             - ? vestibular eval ?              - Reportedly did not tolerate p.o. Valium on inpatient, however was notified by pharmacy that IV Valium and all other IV benzodiazepines are on backorder at the hospital with few doses left.  Replace regimen with Valium 5 mg every 8 hours as needed first-line, Valium IV 2.5 mg every 8 hours as needed second line.  3/17: Somewhat lethargic with p.o. Valium, dizziness did seem to respond well to it though, discontinue IV regimen and continue on 2 to 4 mg of p.o. Valium as needed  symptoms not typical for CVA location ,  , check ortho vitals and vestibular eval, may also be anxiety related since symptoms are relieved by diazepam 12: Asthma, chronic: home regimen: albuterol inhaler as needed, Singulair (not restarted)   13: DM2-controlled: CBGs q AC and q HS; home regimen includes Ozempic q 10 days             -trend CBGS and decide on carb modified diet, SSI, etc.    - Stable on admission labs, monitor   14: CKD IV: Baseline creatinine ~2.5-3.0             -follow-up BMP   - Uptrending BUN, creatinine stable, encourage p.o.  fluids and recheck stable   15: Anemia of chronic disease: hemoglobin stable; follow-up CBC - stable    LOS: 2 days A FACE TO FACE EVALUATION WAS PERFORMED  Charlett Blake 08/28/2022, 9:40 AM

## 2022-08-28 NOTE — Progress Notes (Signed)
Occupational Therapy Session Note  Patient Details  Name: Christina Orozco MRN: [id number] Date of Birth: 1971/05/03  Today's Date: 08/28/2022 OT Individual Time: [id number] OT Individual Time Calculation (min): 70 min    Short Term Goals: Week 1:  OT Short Term Goal 1 (Week 1): Patient will feed herself with Mod I OT Short Term Goal 2 (Week 1): Patient will dress upper body with set up assistance OT Short Term Goal 3 (Week 1): Patient will don/doff pants with close supervision OT Short Term Goal 4 (Week 1): Patient will don/doff socks/shoes with min assist OT Short Term Goal 5 (Week 1): Patient will trasnfer to [facility] with min assist  Skilled Therapeutic Interventions/Progress Updates:  Pt greeted supine in bed, pt agreeable to OT intervention, pt reports dizziness much better today. Session focus on BADL reeducation, functional mobility, dynamic standing balance and decreasing overall caregiver burden.       Pt completed supine>sit to L side of bed using gaze stabilization strategies with CGA. Pt sat EOB for several minutes to accommodate for positional change but no true dizziness reported. Stand pivot to w/c with RW and MINA. Pt completed ambulatory transfer into bathroom with Rw and MINA, continent urine void with pt able completing 3/3 toileting tasks with CGA. Pt exited bathroom in same manner with RW.   Completed bathing at sink with overall CGA to stand to wash LB. Pt completed dressing from w/c with overall CGA for LB dressing via sit<stands.  Pt donned shoes and socks with set- up assist. Pt able to ambulate around foot of bed with Rw and MINA.        Ended session with pt seated in recliner with all needs within reach.               Therapy Documentation Precautions:  Precautions Precautions: Fall Precaution Comments: dizziness with position changes Restrictions Weight Bearing Restrictions: No Pain: no pain    Therapy/Group: Individual Therapy  Corinne Ports  [facility] 08/28/2022, 12:05 PM

## 2022-08-28 NOTE — Progress Notes (Signed)
Inpatient Rehabilitation Care Coordinator Assessment and Plan Patient Details  Name: Christina Orozco MRN: [id number] Date of Birth: 1971/02/05  Today's Date: 08/28/2022  Hospital Problems: Principal Problem:   CVA (cerebral vascular accident) [facility])  Past Medical History:  Past Medical History:  Diagnosis Date   Anemia    severe   Anxiety    Asthma    CKD (chronic kidney disease)    stage IV   DDD (degenerative disc disease), cervical    Depression    Diabetes mellitus ([place])    Dislocated shoulder    right   GERD (gastroesophageal reflux disease)    Headache    migraines (about once a month)   History of kidney stones    Hypertension    Neuropathy    Peripheral vascular disease ([place])    Pneumonia    Stroke [facility])    Past Surgical History:  Past Surgical History:  Procedure Laterality Date   ADENOIDECTOMY     APPENDECTOMY     CERVICAL SPINE SURGERY  06/2012   C5-C6   EYE SURGERY     left eye surgery    FOOT SURGERY     KENALOG INJECTION Left 03/10/2021   Procedure: Intravitreal Avastin Injection;  Surgeon: Sherlynn Stalls, MD;  Location: [place];  Service: Ophthalmology;  Laterality: Left;   LOOP RECORDER INSERTION N/A 08/27/2017   Procedure: LOOP RECORDER INSERTION;  Surgeon: Sanda Klein, MD;  Location: [place] CV LAB;  Service: Cardiovascular;  Laterality: N/A;   MEMBRANE PEEL Left 03/10/2021   Procedure: MEMBRANE PEEL;  Surgeon: Sherlynn Stalls, MD;  Location: [place];  Service: Ophthalmology;  Laterality: Left;   PARS PLANA VITRECTOMY Left 03/10/2021   Procedure: TWENTY-FIVE GAUGE PARS PLANA VITRECTOMY;  Surgeon: Sherlynn Stalls, MD;  Location: [place];  Service: Ophthalmology;  Laterality: Left;   PHOTOCOAGULATION WITH LASER Left 03/10/2021   Procedure: PHOTOCOAGULATION WITH LASER;  Surgeon: Sherlynn Stalls, MD;  Location: [place];  Service: Ophthalmology;  Laterality: Left;   SHOULDER SURGERY     TEE WITHOUT CARDIOVERSION N/A 08/27/2017   Procedure: TRANSESOPHAGEAL  ECHOCARDIOGRAM (TEE);  Surgeon: Sanda Klein, MD;  Location: [facility] ENDOSCOPY;  Service: Cardiovascular;  Laterality: N/A;   TONSILLECTOMY     Social History:  reports that she has never smoked. She has never used smokeless tobacco. She reports that she does not drink alcohol and does not use drugs.  Family / Support Systems Marital Status: Single Patient Roles: Parent Spouse/Significant Other: N/A Children: Daughters (youngest lives with patient other daughter does nto live with patient but able to assist. Other Supports: N/A Anticipated Caregiver: 2 Daughters and brother Ability/Limitations of Caregiver: Youngest daughter adjusting school [place] (only has classes on Wednesdays). Other daughter works at [employer].  Family arrging 24/7 Caregiver Availability: 24/7 Family Dynamics: Supportive family (2 daughters and brother primarily)  Social History Preferred language: English Religion: Baptist Cultural Background: Patient has a hx of CVA's. Disabled since 2023 Education: [place] - How often do you need to have someone help you when you read instructions, pamphlets, or other written material from your doctor or pharmacy?: Sometimes Writes: Yes Employment Status: Disabled Date Retired/Disabled/Unemployed: 2023 Legal History/Current Legal Issues: N/A Guardian/Conservator: N/A   Abuse/Neglect Abuse/Neglect Assessment Can Be Completed: Yes Physical Abuse: Denies Verbal Abuse: Denies Sexual Abuse: Denies Exploitation of patient/patient's resources: Denies Self-Neglect: Denies  Patient response to: Social Isolation - How often do you feel lonely or isolated from those around you?: Never  Emotional Status Pt's affect, behavior and  adjustment status: Patient pleasant, previous patient of CIR. Recent Psychosocial Issues: Anxiety, Depression, Hx of CVA Psychiatric History: Anxiety and Depression Substance Abuse History: N/A  Patient / Family Perceptions,  Expectations & Goals Pt/Family understanding of illness & functional limitations: yes Premorbid pt/family roles/activities: Independent, patient required assistance with stairs and cognitive tasks Anticipated changes in roles/activities/participation: patients 2 daughters anticpate assisting at d/c. Family arranging 24/7 supervision among themselves. Pt/family expectations/goals: Mod I to intermittent supervision with PT, OT and SLP at wheelchair level  [place]: None Premorbid Home Care/DME Agencies: Other (Comment) (SPC, rolling walker, Shower seat, grab bars) Transportation available at discharge: Daughters or brother able to transport Is the patient able to respond to transportation needs?: Yes In the past 12 months, has lack of transportation kept you from medical appointments or from getting medications?: No In the past 12 months, has lack of transportation kept you from meetings, work, or from getting things needed for daily living?: No Resource referrals recommended: Neuropsychology  Discharge Planning Living Arrangements: Children Support Systems: Children, Other relatives Type of Residence: Private residence (Level entry) Insurance Resources: Multimedia programmer (specify) ([place]) Financial Resources: SSD Financial Screen Referred: No Living Expenses: Education officer, community Management: Patient Does the patient have any problems obtaining your medications?: No Home Management: Independent requring some assistance at times Patient/Family Preliminary Plans: Daughters and brother able to assist Care Coordinator Barriers to Discharge: Lack of/limited family support, Decreased caregiver support, Insurance underwriter for SNF coverage Care Coordinator Anticipated Follow Up Needs: HH/OP Expected length of stay: 12-18 Days  Clinical Impression SW met with patient and spoke with brother via telephone , introduced self and explained role. Patient anticipates discharging back home  with youngest daughter (73 and attends [employer]). Youngest daughter making arrangements to school schedule to provide 24/7 supervision/assist. Patient has another daughter who works at SNF able to assist as well. Patient brother anticipates assisting patients daughter with care, supervision and appointments.Level entry home. DME items patient has: SPC, rolling walker, shower seat and grab bars. No additional questions or concerns.   [name] 08/28/2022, 1:21 PM

## 2022-08-28 NOTE — Plan of Care (Signed)
  Problem: RH Swallowing Goal: LTG Patient will consume least restrictive diet using compensatory strategies with assistance (SLP) Description: LTG:  Patient will consume least restrictive diet using compensatory strategies with assistance (SLP) Outcome: Completed/Met Flowsheets (Taken 08/28/2022 0756) LTG: Pt Patient will consume least restrictive diet using compensatory strategies with assistance of (SLP): Modified Independent

## 2022-08-28 NOTE — Progress Notes (Signed)
[facility] Individual Statement of Services  Patient Name:  Christina Orozco  Date:  08/28/2022  Welcome to the [facility].  Our goal is to provide you with an individualized program based on your diagnosis and situation, designed to meet your specific needs.  With this comprehensive rehabilitation program, you will be expected to participate in at least 3 hours of rehabilitation therapies Monday-Friday, with modified therapy programming on the weekends.  Your rehabilitation program will include the following services:  Physical Therapy (PT), Occupational Therapy (OT), Speech Therapy (ST), 24 hour per day rehabilitation nursing, Therapeutic Recreaction (TR), Neuropsychology, Care Coordinator, Rehabilitation Medicine, Nutrition Services, Pharmacy Services, and Other  Weekly team conferences will be held on Wednesdays to discuss your progress.  Your Inpatient Rehabilitation Care Coordinator will talk with you frequently to get your input and to update you on team discussions.  Team conferences with you and your family in attendance may also be held.  Expected length of stay:  12-18 Days  Overall anticipated outcome: Mod I to intermittent supervision with PT, OT and SLP at wheelchair level   Depending on your progress and recovery, your program may change. Your Inpatient Rehabilitation Care Coordinator will coordinate services and will keep you informed of any changes. Your Inpatient Rehabilitation Care Coordinator's name and contact numbers are listed  below.  The following services may also be recommended but are not provided by the [facility]:   [place] will be made to provide these services after discharge if needed.  Arrangements include referral to agencies that provide these services.  Your insurance has been verified to be:   [place] Your  primary doctor is:  Jenel Lucks, PA-C  Pertinent information will be shared with your doctor and your insurance company.  Inpatient Rehabilitation Care Coordinator:  Erlene Quan, [place] or [phone]  Information discussed with and copy given to patient by: [name], 08/28/2022, 9:01 AM

## 2022-08-28 NOTE — Progress Notes (Signed)
Occupational Therapy Session Note  Patient Details  Name: Clevie Figuera MRN: [id number] Date of Birth: 05/08/1971  Today's Date: 08/28/2022 OT Individual Time: [id number] OT Individual Time Calculation (min): 34 min    Short Term Goals: Week 1:  OT Short Term Goal 1 (Week 1): Patient will feed herself with Mod I OT Short Term Goal 2 (Week 1): Patient will dress upper body with set up assistance OT Short Term Goal 3 (Week 1): Patient will don/doff pants with close supervision OT Short Term Goal 4 (Week 1): Patient will don/doff socks/shoes with min assist OT Short Term Goal 5 (Week 1): Patient will trasnfer to [facility] with min assist  Skilled Therapeutic Interventions/Progress Updates:    Pt received in bed and agreeable to participating. Had pt work on rolling to R and moving sidelying to sit with SLOW movement and gaze stabilization to avoid inducing dizziness.  Once EOB worked on finding midline as pt leans to L.  Cued pt wt shift onto R hip.  She reported just sitting as 5/10 dizziness.  Had pt try to do small ROM lateral tracking following a target.  Without moving her head she was able to move eyes side to side a very small range of only 5 degrees.  Pt reported that it felt as if her body was moving (it was not). Pt able to tolerate sitting upright with occasional cues for midline.  She had old L RTC injury.  Good distal strength. Pt used gaze stabilization to work on scooting down length of bed toward pillows, back to sidelying, to supine. Pt moved slowly and tolerated movement well.  Then focused on target on ceiling as bed moved into a chair position so she could prepare for lunch.  Pt in bed with all needs met, alarm set.   Therapy Documentation Precautions:  Precautions Precautions: Fall Precaution Comments: dizziness with position changes Restrictions Weight Bearing Restrictions: No Pain: Pain Assessment Pain Scale: 0-10 Pain Score: 0-No pain   Therapy/Group: Individual  Therapy  [place] 08/28/2022, 10:38 AM

## 2022-08-28 NOTE — Discharge Instructions (Addendum)
Inpatient Rehab Discharge Instructions  Christina Orozco Discharge date and time:  09/05/2022  Activities/Precautions/ Functional Status: Activity: no lifting, driving, or strenuous exercise until cleared by MD Diet: cardiac diet Wound Care: none needed Functional status:  ___ No restrictions     ___ Walk up steps independently __x_ 24/7 supervision/assistance   ___ Walk up steps with assistance ___ Intermittent supervision/assistance  ___ Bathe/dress independently ___ Walk with walker     ___ Bathe/dress with assistance ___ Walk Independently    ___ Shower independently ___ Walk with assistance    __x_ Shower with assistance _x__ No alcohol     ___ Return to work/school ________ Special Instructions: No driving, alcohol consumption or tobacco use.               STROKE/TIA DISCHARGE INSTRUCTIONS SMOKING Cigarette smoking nearly doubles your risk of having a stroke & is the single most alterable risk factor  If you smoke or have smoked in the last 12 months, you are advised to quit smoking for your health. Most of the excess cardiovascular risk related to smoking disappears within a year of stopping. Ask you doctor about anti-smoking medications [place] Quit Line: 1-800-QUIT NOW Free Smoking Cessation Classes [phone]  CHOLESTEROL Know your levels; limit fat & cholesterol in your diet  Lipid Panel     Component Value Date/Time   CHOL 121 08/23/2022 0247   CHOL 135 06/17/2020 1053   TRIG 43 08/23/2022 0247   HDL 77 08/23/2022 0247   HDL 47 06/17/2020 1053   CHOLHDL 1.6 08/23/2022 0247   VLDL 9 08/23/2022 0247   LDLCALC 35 08/23/2022 0247   LDLCALC 63 06/17/2020 1053     Many patients benefit from treatment even if their cholesterol is at goal. Goal: Total Cholesterol (CHOL) less than 160 Goal:  Triglycerides (TRIG) less than 150 Goal:  HDL greater than 40 Goal:  LDL (LDLCALC) less than 100   BLOOD PRESSURE American Stroke Association blood pressure target is  less that 120/80 mm/Hg  Your discharge blood pressure is:  BP: (!) 150/69 Monitor your blood pressure Limit your salt and alcohol intake Many individuals will require more than one medication for high blood pressure  DIABETES (A1c is a blood sugar average for last 3 months) Goal HGBA1c is under 7% (HBGA1c is blood sugar average for last 3 months)  Diabetes: Diagnosis of diabetes:  Your A1c:6.3 %    Lab Results  Component Value Date   HGBA1C 6.3 (H) 08/22/2022    Your HGBA1c can be lowered with medications, healthy diet, and exercise. Check your blood sugar as directed by your physician Call your physician if you experience unexplained or low blood sugars.  PHYSICAL ACTIVITY/REHABILITATION Goal is 30 minutes at least 4 days per week  Activity: Increase activity slowly, Therapies: Physical Therapy: Outpatient Return to work: when cleared by MD Activity decreases your risk of heart attack and stroke and makes your heart stronger.  It helps control your weight and blood pressure; helps you relax and can improve your mood. Participate in a regular exercise program. Talk with your doctor about the best form of exercise for you (dancing, walking, swimming, cycling).  DIET/WEIGHT Goal is to maintain a healthy weight  Your discharge diet is:  Diet Order             Diet regular Room service appropriate? No; Fluid consistency: Thin  Diet effective now  thin liquids Your height is:  Height: 5\' 2"  (157.5 cm) Your current weight is: Weight: 68.8 kg Your Body Mass Index (BMI) is:  BMI (Calculated): 27.73 Following the type of diet specifically designed for you will help prevent another stroke. Your goal weight range is:   Your goal Body Mass Index (BMI) is 19-24. Healthy food habits can help reduce 3 risk factors for stroke:  High cholesterol, hypertension, and excess weight.  RESOURCES Stroke/Support Group:  Call [phone]   STROKE EDUCATION PROVIDED/REVIEWED AND GIVEN  TO PATIENT Stroke warning signs and symptoms How to activate emergency medical system (call 911). Medications prescribed at discharge. Need for follow-up after discharge. Personal risk factors for stroke. Pneumonia vaccine given: No Flu vaccine given: No My questions have been answered, the writing is legible, and I understand these instructions.  I will adhere to these goals & educational materials that have been provided to me after my discharge from the hospital.     My questions have been answered and I understand these instructions. I will adhere to these goals and the provided educational materials after my discharge from the hospital.  Patient/Caregiver Signature _______________________________ Date __________  Clinician Signature _______________________________________ Date __________  Please bring this form and your medication list with you to all your follow-up doctor's appointments.

## 2022-08-28 NOTE — Progress Notes (Signed)
Speech Language Pathology Daily Session Note  Patient Details  Name: Christina Orozco MRN: [id number] Date of Birth: 12-07-1970  Today's Date: 08/28/2022 SLP Individual Time: [id number] SLP Individual Time Calculation (min): 55 min  Short Term Goals: Week 1: SLP Short Term Goal 1 (Week 1): Pt will consume regular textures and thin liquids with mod I use of swallowing precautions and minimal overt s/s of aspiration. SLP Short Term Goal 1 - Progress (Week 1): Met SLP Short Term Goal 2 (Week 1): Pt will utilize external memory aids with supervision to recall daily information. SLP Short Term Goal 3 (Week 1): Pt will complete functional problem solving tasks with >80% accuracy given min cues. SLP Short Term Goal 4 (Week 1): Pt will exhibit sustained attention on structured task for >5 mins given min cues  Skilled Therapeutic Interventions:  Pt seen for skilled SLP session to address dysphagia and cognitive goals. Pt received with regular consistency breakfast tray and thin liquids. Pt consumed 100% of meal tray with no overt or subtle s/s of aspiration observed. She has a hx of esophageal dysphagia, which she reported she manages independently with standard esophageal precautions. Addressed pt's personal cognitive goals. Pt reported she often uses checks and has had checks bounce previously. She may also benefit from structured attention tasks given tangential speech throughout session. Pt became labile when talking about multiple stress factors in her life, including her health. Communicated with physician that pt may benefit from a neuro psych consult as clinically indicated.  Pt left sitting up in bed with call bell within reach and bed alarm activated. Continue SLP PoC.   Pain Pain Assessment Pain Scale: 0-10 Pain Score: 0-No pain  Therapy/Group: Individual Therapy  Wyn Forster 08/28/2022, 7:58 AM

## 2022-08-28 NOTE — Discharge Summary (Signed)
Physician Discharge Summary  Patient ID: Christina Orozco MRN: [id number] DOB/AGE: 07-13-1970 52 y.o.  Admit date: 08/26/2022 Discharge date: 09/05/2022  Discharge Diagnoses:  Principal Problem:   Subcortical infarction [facility]) Active Problems:   CVA (cerebral vascular accident) [facility]) Active problems: Functional deficits secondary to CVA CKD stage IV Hypertension Generalized anxiety disorder Chronic asthma DM-2 Vertigo Hyperlipidemia OSA Anemia of chronic disease  Discharged Condition: good  Significant Diagnostic Studies:  Labs:  Basic Metabolic Panel: Recent Labs  Lab 09/04/22 0759  NA 141  K 3.9  CL 112*  CO2 23  GLUCOSE 122*  BUN 45*  CREATININE 2.58*  CALCIUM 9.0    CBC: Recent Labs  Lab 09/04/22 0759  WBC 6.1  HGB 9.3*  HCT 29.3*  MCV 90.7  PLT 214    Brief HPI:   Christina Orozco is a 52 y.o. female who presented to the ED on 08/22/2022 via EMS with complaints of left-sided weakness, dizziness and aphasia. Code stroke called and evaluated by neurology. CT head and CTA imaging were negative. Physical exam significant for motor 5 out of 5 in all extremities, no drift seen.  However, left foot 0/5 dorsi flexion and plantarflexion as patient unable to flex/extend foot at all. Positive LUE ataxia. MRI brain with punctate focus of acute/early subacute ischemia at the lateral margin of the left BG. Initiated Lovenox for DVT prophylaxis. She was on aspirin and Brilinta 60 mg BID prior to admission due to recurrent stroke 11/2021. Followed at [facility] neurology.  Neurology recommended aspirin and Brilinta for four weeks then stop the Brilinta and stay on aspirin alone. Her past medical history is significant for hypertension on Norvasc and Coreg, hyperlipidemia on atorvastatin 80 mg, DM2-controlled (A1c 5.9%), prior history of multiple strokes/TIA, GERD, depression on Cymbalta. She appears to be at risk for sleep apnea and is interested in considering participation in the  sleep smart study.  She was given written information to review and decided to consent. CPAP tolerability test completed 3/15-3/16. She is tolerating regular diet. The patient continued to display a L lateral lean when sitting EOB, needing modA to prevent LOB, but was responding more clearly but with a delay as the session progressed. Per PT notes on 3/15, the patient stood to take steps towards [facility] to return to supine for her safety, needing modAx2 for all functional mobility. By the end of the session, she continued to display progress in her speech clarity and response speed, but was still delayed.    Hospital Course: Christina Orozco was admitted to rehab 08/26/2022 for inpatient therapies to consist of PT, ST and OT at least three hours five days a week. Past admission physiatrist, therapy team and rehab RN have worked together to provide customized collaborative inpatient rehab. Valium for vertigo transitioned to oral and titrated to avoid lethargy. Hemoglobin stable. Valium discontinued and dizziness improved except with quick turns. Neuropsychology consultation performed on 3/21. Creatinine remained at baseline. BUN up to 42 and increased PO liquids encouraged. ABGs and PFTs obtained. Evidently, our discharge coordinator/ social worker thought this was necessary for her CPAP; however, the rehab staff were never informed of the Smart Sleep study CPAP machine, use or follow-up from neurology service. She evidently was diagnosed with sleep apnea PTA to rehab unit. There were no notes in the acute discharge summary regarding OSA or CPAP machine...just to continue use. Upon further inspection by rehab staff, it was confirmed that the CPAP unit in the patient's room was to be taken home with  her and used as instructed. Patient stated she was told to follow-up with her established neurologist at [facility]. She was able to tell us that her daughter had given her a phone number regarding the Smart Sleep trial and she  was advised to follow-up with that person.  Blood pressures were monitored on TID basis and initially required hydralazine overnight for hypertension. Amlodipine 2.5 mg daily started 3/17. Increased to 5 mg daily on 3/21. Increased to 10 mg daily   Diabetes has been monitored with ac/hs CBG checks and remained stable.   Rehab course: During patient's stay in rehab weekly team conferences were held to monitor patient's progress, set goals and discuss barriers to discharge. At admission, patient required min with basic self-care skills, mod with mobility.  She  has had improvement in activity tolerance, balance, postural control as well as ability to compensate for deficits. She has had improvement in functional use RUE/LUE  and RLE/LLE as well as improvement in awareness   Disposition:  Discharge disposition: 01-Home or Self Care     Diet: diabetic/heart healthy  Special Instructions: No driving, alcohol consumption or tobacco use.  Brilinta 90 mg BID and aspirin 81 mg daily for 4 weeks then stop Brilinta and continue aspirin.  First day of Brilinta was 08/23/2022 per [facility]. Stop Brilinta after doses on 09/20/2022 and continue aspirin as prescribed.  30-35 minutes were spent on discharge planning and discharge summary.  Discharge Instructions     Ambulatory referral to Neurology   Complete by: As directed    An appointment is requested in approximately: 4 weeks   Ambulatory referral to Physical Medicine Rehab   Complete by: As directed    Hospital follow-up   Discharge patient   Complete by: As directed    Discharge disposition: 01-Home or Self Care   Discharge patient date: 09/05/2022      Allergies as of 09/05/2022       Reactions   Lisinopril Swelling, Other (See Comments)   Facial swelling  angioedema   Penicillins Hives   Has patient had a PCN reaction causing immediate rash, facial/tongue/throat swelling, SOB or lightheadedness with hypotension: yes Has patient had a  PCN reaction causing severe rash involving mucus membranes or skin necrosis: No Has patient had a PCN reaction that required hospitalization No Has patient had a PCN reaction occurring within the last 10 years: No If all of the above answers are "NO", then may proceed wit Rash on buttock that makes sitting very difficult   Quetiapine Other (See Comments)   Unknown    Gabapentin Swelling, Other (See Comments)   "I thought I was having another stroke." dizziness        Medication List     STOP taking these medications    acetaminophen 325 MG tablet Commonly known as: TYLENOL   azithromycin 250 MG tablet Commonly known as: ZITHROMAX   carvedilol 12.5 MG tablet Commonly known as: COREG   DULoxetine 60 MG capsule Commonly known as: CYMBALTA   traZODone 50 MG tablet Commonly known as: DESYREL       TAKE these medications    albuterol 108 (90 Base) MCG/ACT inhaler Commonly known as: ProAir HFA INHALE 1 OR 2 PUFFS INTO THE LUNGS EVERY 6 HOURS AS NEEDED FOR WHEEZING OR SHORTNESS OF BREATH What changed:  how much to take how to take this when to take this reasons to take this additional instructions   amLODipine 10 MG tablet Commonly known as: NORVASC Take 1 tablet (  10 mg total) by mouth daily. What changed:  medication strength See the new instructions.   Aspirin Low Dose 81 MG tablet Generic drug: aspirin EC Take 1 tablet (81 mg total) by mouth daily.   atorvastatin 80 MG tablet Commonly known as: LIPITOR Take 1 tablet (80 mg total) by mouth at bedtime.   glucose blood test strip Commonly known as: Contour Next Test 1 each by Other route 2 (two) times daily. And lancets 2/day What changed: additional instructions   montelukast 10 MG tablet Commonly known as: SINGULAIR Take 10 mg by mouth at bedtime.   Ozempic (0.25 or 0.5 MG/DOSE) 2 MG/3ML Sopn Generic drug: Semaglutide(0.25 or 0.5MG /DOS) Inject 0.5 mg into the skin See admin instructions. Inject 0.5  mg subcutaneous every 10 days   pantoprazole 40 MG tablet Commonly known as: PROTONIX Take 1 tablet (40 mg total) by mouth daily.   ticagrelor 90 MG Tabs tablet Commonly known as: BRILINTA Take 1 tablet (90 mg total) by mouth in the morning and at bedtime for 16 days.        Follow-up Information     Jenel Lucks, PA-C Follow up.   Specialty: Internal Medicine Why: Call the office in 1-2 days to make arrangements for hospital follow-up. Contact information: [place] [address] [phone]         Charlett Blake, MD Follow up.   Specialty: Physical Medicine and Rehabilitation Why: office will call you to arrange your appt (sent) Contact information: [place] [address] [phone]         [place] Follow up.   Why: Call the office in 1-2 days to make arrangements for Smart Sleep Study enrollment instructions. (I left message for Britt Boozer, coordinator, to call in regards to discharge instructions) Contact information: [address]     [place] [phone]        Janace Litten, MD Follow up.   Specialty: Neurology Why: 09/06/2022 at 11:15 am Contact information: [id number] [place] [address] [phone]                 Signed: Barbie Banner 09/05/2022, 12:06 PM

## 2022-08-28 NOTE — Progress Notes (Signed)
Inpatient Rehabilitation  Patient information reviewed and entered into eRehab system by Ajaya Crutchfield Daishawn Lauf, OTR/L, Rehab Quality Coordinator.   Information including medical coding, functional ability and quality indicators will be reviewed and updated through discharge.   

## 2022-08-28 NOTE — Progress Notes (Signed)
Physical Therapy Session Note  Patient Details  Name: Christina Orozco MRN: [id number] Date of Birth: 22-Sep-1970  Today's Date: 08/28/2022 PT Individual Time: [id number] PT Individual Time Calculation (min): 62 min   Short Term Goals: Week 1:  PT Short Term Goal 1 (Week 1): STG = LTG 2/2 LOS  Skilled Therapeutic Interventions/Progress Updates: Patient received supine in bed with brother present and agreeable to participate in PT session. Patient reported urinary incontinence upon arrival and demonstrated supervision during bed mobility (Log roll to R side -> EOB) in order to ambulate to toilet per patient request. CGA for safety during STS from EOB<>RW x3 during today's session with consistency. Patient ambulated to toilet with CGA for safety and was able to safely navigate bathroom floor lip and sink. Patient showed decreased cadence, foot clearance, step length and decreased stance time on L LE. Patient demonstrated ability to anterior weight shift in order to don/doff LE clothing, brief and socks without LOB.   Complaints of dizziness and nausea in recent past, and was instructed to follow gaze stabilization instructions (find the "A" in the room). Patient was also educated on finding vertical lines in the environment while ambulating in order to help decrease possible dizziness when out of room. Patient also noted to have gout in LE, and c/o 6/10 pain during STS intervention. Patient stated that she was not going to eat any more red meat during LOS as to not increase gout pain.   Ambulation performed this session 15' x 2 from EOB<>toilet, 30' x 1 from EOB to hallway (patient required rest break 2/2 becoming stuck during terminal swing phase on R LE and required min A in order to prevent LOB). Patient transferred by therapist to day room for therex.  Patient performed STS from hi/low mat<>RW (lowest setting) x10 with CGA A for safety. Patient progressed STS by protruding R LE further than L LE in  order to promote WB on affected side.  Patient transferred back to room and was left in supine with HOB elevated and all necessities in reach.       Therapy Documentation Precautions:  Precautions Precautions: Fall Precaution Comments: dizziness with position changes Restrictions Weight Bearing Restrictions: No   Therapy/Group: Individual Therapy  [place]i Coupland Dortha Kern, PTA 08/28/2022, 2:33 PM

## 2022-08-29 ENCOUNTER — Encounter (HOSPITAL_COMMUNITY): Payer: Self-pay | Admitting: Physical Medicine & Rehabilitation

## 2022-08-29 NOTE — Progress Notes (Signed)
PROGRESS NOTE   Subjective/Complaints:  No issues overnite , states her brother and daughters will work out supervision post discharge , no pain c/os, had busy day in therapy yesterday   ROS: Has LLE weakness, no double vision, no numbness, no abd discomfort or breathing issues  Objective:   No results found. Recent Labs    08/26/22 1509  WBC 6.4  HGB 9.9*  HCT 30.3*  PLT 222    Recent Labs    08/26/22 1509  NA 141  K 4.1  CL 111  CO2 23  GLUCOSE 89  BUN 42*  CREATININE 3.06*  CALCIUM 9.2     Intake/Output Summary (Last 24 hours) at 08/29/2022 0807 Last data filed at 08/28/2022 1809 Gross per 24 hour  Intake 295 ml  Output --  Net 295 ml         Physical Exam: Vital Signs Blood pressure (!) 155/92, pulse 81, temperature 97.7 F (36.5 C), temperature source Oral, resp. rate 18, height 5\' 2"  (1.575 m), weight 68.8 kg, last menstrual period 05/22/2018, SpO2 100 %.  General: No acute distress Mood and affect are appropriate Heart: Regular rate and rhythm no rubs murmurs or extra sounds Lungs: Clear to auscultation, breathing unlabored, no rales or wheezes Abdomen: Positive bowel sounds, soft nontender to palpation, nondistended Extremities: No clubbing, cyanosis, or edema Skin: No evidence of breakdown, no evidence of rash   MSK:      No apparent deformity.      Strength: Right upper and lower extremity 5 out of 5 throughout.  Left  4-/5 in upper, 4+/5 in lower.   Neurologic exam:  Cognition: AAO to person, place, time and event. + Mildly lethargic   Language: Fluent, No substitutions or neoglisms.  Mild dysarthria. Names 3/3 objects correctly.  Memory: Recalls 3/3 objects at 5 minutes. No apparent deficits  Insight: Good  insight into current condition.  Mood: Pleasant affect, appropriate mood.  Sensation: To light touch intact in BL UEs and LEs  Reflexes: 2+ in BL UE and LEs.  Positive Hoffmann  sign in left upper extremity.  Negative Babinski. CN: Positive mild right decreased nasolabial fold.  Otherwise, intact. Coordination: Left upper extremity ataxia on FTN  Spasticity: MAS 0 in all extremities.          Assessment/Plan: 1. Functional deficits which require 3+ hours per day of interdisciplinary therapy in a comprehensive inpatient rehab setting. Physiatrist is providing close team supervision and 24 hour management of active medical problems listed below. Physiatrist and rehab team continue to assess barriers to discharge/monitor patient progress toward functional and medical goals  Care Tool:  Bathing    Body parts bathed by patient: Right arm, Left arm, Chest, Abdomen, Front perineal area, Buttocks, Right upper leg, Left upper leg, Face         Bathing assist Assist Level: Contact Guard/Touching assist     Upper Body Dressing/Undressing Upper body dressing   What is the patient wearing?: Pull over shirt    Upper body assist Assist Level: Set up assist    Lower Body Dressing/Undressing Lower body dressing      What is the patient wearing?: Pants, Underwear/pull  up     Lower body assist Assist for lower body dressing: Contact Guard/Touching assist     Toileting Toileting Toileting Activity did not occur (Clothing management and hygiene only): N/A (no void or bm)  Toileting assist Assist for toileting: Contact Guard/Touching assist     Transfers Chair/bed transfer  Transfers assist     Chair/bed transfer assist level: Minimal Assistance - Patient > 75%     Locomotion Ambulation   Ambulation assist      Assist level: Moderate Assistance - Patient 50 - 74% Assistive device: Walker-rolling Max distance: 42ft   Walk 10 feet activity   Assist  Walk 10 feet activity did not occur: Safety/medical concerns (2/2 dizziness)        Walk 50 feet activity   Assist Walk 50 feet with 2 turns activity did not occur: Safety/medical concerns  (2/2 dizziness)         Walk 150 feet activity   Assist Walk 150 feet activity did not occur: Safety/medical concerns (2/2 dizziness)         Walk 10 feet on uneven surface  activity   Assist Walk 10 feet on uneven surfaces activity did not occur: Safety/medical concerns (2/2 dizziness)         Wheelchair     Assist Is the patient using a wheelchair?: No   Wheelchair activity did not occur: N/A         Wheelchair 50 feet with 2 turns activity    Assist    Wheelchair 50 feet with 2 turns activity did not occur: N/A       Wheelchair 150 feet activity     Assist  Wheelchair 150 feet activity did not occur: N/A       Blood pressure (!) 155/92, pulse 81, temperature 97.7 F (36.5 C), temperature source Oral, resp. rate 18, height 5\' 2"  (1.575 m), weight 68.8 kg, last menstrual period 05/22/2018, SpO2 100 %.   Medical Problem List and Plan: 1. Functional deficits secondary to stroke with L sided hemiparesis She presented to [facility] on 12/19/21 with left-sided weakness and visual deficits and MRI/MRA brain done revealing 1.9 cm acute ischemic nonhemorrhagic infarct in the deep white matter right frontal centrum ovale and advanced chronic microvascular disease.   New CVA 3/12 Left BG infarct               -patient may shower             -ELOS/Goals: 12-18 days, [facility] PT/OT/SLP- team conf in am                - Note, multiple prior history of stroke on both Brilinta, aspirin.  High risk for recurrence.Failed clopidigrel in past    2.  Antithrombotics: -DVT/anticoagulation:  Pharmaceutical: Lovenox             -antiplatelet therapy: Aspirin 81 mg daily and Brilinta 90 mg BID   3. Pain Management: Tylenol as needed   4. Mood/Behavior/Sleep: LCSW to evaluate and provide emotional support -generalized anxiety disorder             -depression: continue Cymbalta             -antipsychotic agents: n/a   5. Neuropsych/cognition: This  patient is capable of making decisions on her own behalf.   6. Skin/Wound Care: Routine skin care checks   7. Fluids/Electrolytes/Nutrition: Routine Is and Os and follow-up chemistries   8: Hypertension: monitor TID and prn   -  Poorly controlled, still in range for permissive hypertension. add as needed hydralazine for systolic BPs greater than 180.     - 3/18: Will start amlodipine 2.5 mg daily (was on 5 mg at baseline)    08/29/2022    6:24 AM 08/28/2022    8:29 PM 08/28/2022    2:30 PM  Vitals with BMI  Systolic [id number]  Diastolic 92 72 88  Pulse 81 80 85   Cont permissive HTN   9: Hyperlipidemia: continue statin   10: OSA: CPAP mask test>>passed first phase.  Will need to coordinate outpatient equipment/follow-up   11: Dizziness/vertigo: ? continue diazepam 5 mg q 4 hours prn (received two doses on Thursday and one on Friday). Antivert was initially given and stopped.             - ? vestibular eval ?              - Reportedly did not tolerate p.o. Valium on inpatient, however was notified by pharmacy that IV Valium and all other IV benzodiazepines are on backorder at the hospital with few doses left.  Replace regimen with Valium 5 mg every 8 hours as needed first-line, Valium IV 2.5 mg every 8 hours as needed second line.  3/17: Somewhat lethargic with p.o. Valium, dizziness did seem to respond well to it though, discontinue IV regimen and continue on 2 to 4 mg of p.o. Valium as needed- not taking regularly - will d/c  symptoms not typical for CVA location ,  , check ortho vitals and vestibular eval, may also be anxiety related since symptoms are relieved by diazepam 12: Asthma, chronic: home regimen: albuterol inhaler as needed, Singulair (not restarted)   13: DM2-controlled: CBGs q AC and q HS; home regimen includes Ozempic q 10 days             -trend CBGS and decide on carb modified diet, SSI, etc.    - Stable on admission labs, monitor   14: CKD IV: Baseline creatinine  ~2.5-3.0             -follow-up BMP   - Uptrending BUN, creatinine stable, encourage p.o. fluids and recheck stable   15: Anemia of chronic disease: hemoglobin stable; follow-up CBC - stable    LOS: 3 days A FACE TO FACE EVALUATION WAS PERFORMED  Charlett Blake 08/29/2022, 8:07 AM

## 2022-08-29 NOTE — Progress Notes (Signed)
Speech Language Pathology Daily Session Note  Patient Details  Name: Christina Orozco MRN: [id number] Date of Birth: 09/22/1970  Today's Date: 08/29/2022 SLP Individual Time: [id number] SLP Individual Time Calculation (min): 60 min  Short Term Goals: Week 1: SLP Short Term Goal 1 (Week 1): Pt will consume regular textures and thin liquids with mod I use of swallowing precautions and minimal overt s/s of aspiration. SLP Short Term Goal 1 - Progress (Week 1): Met SLP Short Term Goal 2 (Week 1): Pt will utilize external memory aids with supervision to recall daily information. SLP Short Term Goal 3 (Week 1): Pt will complete functional problem solving tasks with >80% accuracy given min cues. SLP Short Term Goal 4 (Week 1): Pt will exhibit sustained attention on structured task for >5 mins given min cues  Skilled Therapeutic Interventions:  Pt was seen in PM to address cognitive re- training. Pt was alert and seated upright in WC upon SLP arrival. She denied pain and dizziness stating, "I haven't had anything for my dizziness in two days." She was agreeable for this session. Pt able to verbalize most recent medical hx and hx of CVA and TIAs since 2016. She further reports limitations including visual impairment and physical deficits which she has compensated for indicative of anticipatory awareness. Pt reports meds are shipped to her home and are prepackaged, use of her phone for recall of appointments, family providing assist with meals, and assistance from her family to pay bills. SLP addressing problem solving within least restrictive environments. Given scenarios presented verbally, pt identified solutions with 85% acc mod I. In missed opportunities, SLP provided appropriate response and rationale. Pt verbalized understanding. Pt was left alert seated upright in WC with call button within reach. SLP to continue POC.   Pain No Pain.  Therapy/Group: Individual Therapy  Colin Benton 08/29/2022,  4:13 PM

## 2022-08-29 NOTE — Progress Notes (Signed)
2Physical Therapy Session Note  Patient Details  Name: Crystalrose Washer MRN: [id number] Date of Birth: 06-25-1970  Today's Date: 08/29/2022 PT Individual Time:  -      Short Term Goals: Week 1:  PT Short Term Goal 1 (Week 1): STG = LTG 2/2 LOS  Skilled Therapeutic Interventions/Progress Updates: Patient welcomed supine in bed and agreeable to participate in PT session. Patient had no complaints of pain or dizziness/nausea. Supine<EOB performed with HOB elevated with supervision for safety. Patient doffed grip socks, and donned B socks/crocs with supervision for safety. Sit to stand from EOB to RW with CGA for safety.   Patient ambulated from room to hallway about 30' CGA + 2 with WC for safety. Patient required a rest break at this time due to L knee locking per pt report. Therapist transferred patient to day room gym for interventions.  Sit to stand on hi/low mat performed with R LE elevated on 4" block in order to promote WB on L LE. Intervention moved to 2" block in order to increase patient's static standing balance. Patient stated dependence on R LE and is a "go to" for standing support. Patient noted to present with L lateral lean while sitting edge of mat, and while static standing. Therapist moved from guarding on side of patient to sitting on stool with a mirror lin front and a line down the middle for external cues so that the patient can self correct L lateral lean. Patient required min A at first during interventions, but was able to progress to CGA; however, patient required moderate cues to not lean into therapists hand guarding on patient's L hip.  Patient ambulated with RW from day room gym 4' with therapist on stool facilitating quad and glute activation as patient noted to present with genu recurvatum while ambulating. Patient noted to show improvement in gait quality per demonstration of increased cadence, step length, and decreased genu recurvatum. Patient did require a rest  break as a "pop" was heard from patient's L knee (no reports of pain or discomfort). Therapist transferred patient back to room and was left in [facility] with all necessities in reach.    Therapy Documentation Precautions:  Precautions Precautions: Fall Precaution Comments: dizziness with position changes Restrictions Weight Bearing Restrictions: No    Therapy/Group: Individual Therapy  Mckade Gurka, PTA 08/29/2022, 12:10 PM

## 2022-08-29 NOTE — Progress Notes (Signed)
Occupational Therapy Session Note  Patient Details  Name: Christina Orozco MRN: [id number] Date of Birth: 1970-10-23  Today's Date: 08/29/2022 OT Individual Time: [id number] session 1 OT Individual Time Calculation (min): 59 min  Session 2: [id number]   Short Term Goals: Week 1:  OT Short Term Goal 1 (Week 1): Patient will feed herself with Mod I OT Short Term Goal 2 (Week 1): Patient will dress upper body with set up assistance OT Short Term Goal 3 (Week 1): Patient will don/doff pants with close supervision OT Short Term Goal 4 (Week 1): Patient will don/doff socks/shoes with min assist OT Short Term Goal 5 (Week 1): Patient will trasnfer to [facility] with min assist  Skilled Therapeutic Interventions/Progress Updates:  Session 1: Pt greeted supine in bed, pt agreeable to OT intervention. Session focus on BADL reeducation, functional mobility, dynamic standing balance and decreasing overall caregiver burden.    Pt completed supine>sit with CGA, pt with no reports of dizziness, pt did not require use of gaze stabilization strategies during transitions this date. Pt completed ambulatory transfer to toilet with RW and CGA, noted LLE to "snap back" during gait today, pt reports this is baseline, especially in AM. Pt completed 3/3 toileting tasks with CGA, continent urine void. Pt entered shower with RW and CGA and use of grab bars. Pt completed bathing from shower seat/standing with overall CGA.              Pt exited shower with close CGA with RW, pt completed dressing from w/c in bathroom with overall CGA, one LOB when standing to pull up underwear but pt able to self correct. Pt completed seated grooming tasks at sink MODI including oral care and donning deo.     Worked on functional ambulation with Rw to facilitate improved activity tolerance and promote independence with functional mobility. Pt completed functional ambulation with RW >131ft with CGA and chair follow. Pt reports mild dizziness  after completing functional mobility including turns and navigating obstacles in hallway, however no true intervention needed as pt reported feeling subsided quickly.   Ended session with pt supine in bed with all needs within reach and bed alarm activated.                   Session 2: Pt greeted seated in w/c, pt agreeable to OT intervention. Total A transport to gym for time mgmt. Pt requested to work on on BLE strength/endurance. Pt completed functional ambulation in gym with level 2 theraband tied around quads to facilitate improved quad strength/endurance for higher level functional mobility tasks with Rw and overall CGA. Pt reports gout flare up during ambulation needing to sit down and rest post ~ 30 ft.   Pt continues to deny any deficits any UEs from this stroke, have not noted any true deficits but did have pt complete below assessment to further assess:             Box and Blocks Test measures unilateral gross manual dexterity. - Instructions The pt was instructed to carry one block over at a time and go as quickly as they could, making sure their fingertips crossed the partition. One minute was given to complete the task per UE. The pt was allowed a 15-second trial period prior to testing if needed. - Results The pt transferred  29 blocks with the R hand and 25 with the L hand. The total number of blocks carried from one compartment to the other in one minute is  scored per hand. Higher scores on the test indicate better gross manual dexterity. No deficits noted during task.   Pt transported back to room in w/c with total A with pt left up in w/c with all needs within reach.  Therapy Documentation Precautions:  Precautions Precautions: Fall Precaution Comments: dizziness with position changes Restrictions Weight Bearing Restrictions: No Pain:  Session 1: no pain reported  Session 2: unrated pain reported in LLE from gout flare up, seat rest break provided.  Therapy/Group:  Individual Therapy  Corinne Ports [facility] 08/29/2022, 11:53 AM

## 2022-08-29 NOTE — Progress Notes (Signed)
Patient ID: Christina Orozco, female   DOB: 02/11/1971, 52 y.o.   MRN: [id number] Met with the patient to review current situation, rehab process, team conference and plan of care. Reviewed management of secondary risks including HTN on Norvasc and coreg, previous stroke; on ASA + Brilinta and HLD on lipitor with (LDL35/Trig 43) and DM managed with Ozempic (A1C 6.3) usually follows CMM diet at home. Expressed concern about OSA+ CPAP study and stroke related dizziness managed with valium. Also reviewed other medications and dietary modification recommendations. Reported previous services from [facility] (PT/OT) and use of [employer] - information forwarded to SW. Continue to follow along to address educational needs to facilitate preparation for discharge. Margarito Liner

## 2022-08-30 DIAGNOSIS — F331 Major depressive disorder, recurrent, moderate: Secondary | ICD-10-CM

## 2022-08-30 DIAGNOSIS — I63412 Cerebral infarction due to embolism of left middle cerebral artery: Secondary | ICD-10-CM

## 2022-08-30 MED ORDER — AMLODIPINE BESYLATE 5 MG PO TABS
5.0000 mg | ORAL_TABLET | Freq: Every day | ORAL | Status: DC
  Administered 2022-08-31 – 2022-09-04 (×5): 5 mg via ORAL
  Filled 2022-08-30 (×5): qty 1

## 2022-08-30 NOTE — Progress Notes (Signed)
Speech Language Pathology Daily Session Note  Patient Details  Name: Taraneh Whitsett MRN: [id number] Date of Birth: August 02, 1970  Today's Date: 08/30/2022 SLP Individual Time: [id number] SLP Individual Time Calculation (min): 45 min  Short Term Goals: Week 1: SLP Short Term Goal 1 (Week 1): Pt will consume regular textures and thin liquids with mod I use of swallowing precautions and minimal overt s/s of aspiration. SLP Short Term Goal 1 - Progress (Week 1): Met SLP Short Term Goal 2 (Week 1): Pt will utilize external memory aids with supervision to recall daily information. SLP Short Term Goal 3 (Week 1): Pt will complete functional problem solving tasks with >80% accuracy given min cues. SLP Short Term Goal 4 (Week 1): Pt will exhibit sustained attention on structured task for >5 mins given min cues  Skilled Therapeutic Interventions: Pt seen this date for skilled ST intervention targeting cognitive goals outlined in care plan. Pt received awake/alert and lying semi-reclined in bed. Agreeable to intervention. Pleasant and participatory throughout.   Pt continues to demonstrate stable emergent and anticipatory awareness of deficits and how these will impact her daily life during this admission and post-d/c with Mod I. To address cognitive training within iADL tasks, SLP provided fictitious check writing task x 2 with pt making error on amount due on 1 out of 2 of the checks - corrected given Min A verbal cues. Reports that she made these errors prior to admission, and that most the time her daughter will write the check out, and she will sign.   To address recall, SLP provided a list of 4 words, which pt recalled immediately with 100%; however, following a delay with distractions, pt only able to recall 50% of word list - improved to 100% accuracy given Mod A verbal choice prompts (unsuccessful with category prompt alone). Pt verbalized that she feels she is nearing and/or is at her cognitive  baseline. Will plan to re-assess cognition in upcoming sessions.   Pt left in bed with all safety measures activated, call bell within reach, and all immediate needs met. Continue per current ST POC.   Pain No pain reported to this therapist; NAD  Therapy/Group: Individual Therapy  Jathan Balling A Cordaro Mukai 08/30/2022, 8:25 PM

## 2022-08-30 NOTE — Patient Care Conference (Signed)
Inpatient RehabilitationTeam Conference and Plan of Care Update Date: 08/30/2022   Time: 10:22 AM   Patient Name: Christina Orozco      Medical Record Number: [id number]  Date of Birth: Oct 24, 1970 Sex: Female         Room/Bed: 4W07C/4W07C-01 Payor Info: Payor: [employer] MEDICARE / Plan: [employer] DUAL COMPLETE / Product Type: *No Product type* /    Admit Date/Time:  08/26/2022  2:21 PM  Primary Diagnosis:  CVA (cerebral vascular accident) [facility])  Hospital Problems: Principal Problem:   CVA (cerebral vascular accident) [facility])    Expected Discharge Date: Expected Discharge Date: 09/05/22  Team Members Present: Physician leading conference: Dr. Alysia Penna Social Worker Present: Erlene Quan, BSW Nurse Present: Dorien Chihuahua, RN PT Present: Alden Hipp, PT OT Present: Meriel Pica, OT SLP Present: Weston Anna, SLP PPS Coordinator present : Gunnar Fusi, SLP     Current Status/Progress Goal Weekly Team Focus  Bowel/Bladder   continent of Bowel and bladder, with occasion incontinence in past couple days.   Decrease incontinent episodes.   Assist with toileting as needed    Swallow/Nutrition/ Hydration   Regular textures with thin liquids, Mod I   Mod I  Goal Met    ADL's   CGA overall for ADLS, CGA for ambulatory ADL transfers with RW. reports of dizziness seem to have improved continues to move slow and guarded   MODI   BADL reeducation, energy conservation, DME training, family ed    Mobility   Patient supervision with bed mobility; CGA during transfers; CGA-min A during ambulation (L knee tends to lock); WB favortism to R LE vs L LE   Mod I - Supervision  Family education; ambulation with L LE control. Centered balance while standing    Communication                Safety/Cognition/ Behavioral Observations  Min A   Supervision   functional problem solving, recall with use of strategies, sustianed attention    Pain   No c/o  at this time   Pain <3/10   Assess Qshift and prn    Skin   Skin intact   Maintain skin integrity  Assess Qshift and prn      Discharge Planning:  Discharging home with daughter (54), attened [employer] and [employer]aning [employer]. 2 daughters and brother to assist and provide 24/7. Level entry home. Pt has: SPC, RW, Advice worker.   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 Dizziness improved.  Patient on target to meet rehab goals: yes, currently needs supervision for bed mobility and CGA for transfers.  Needs Patient  needs CGA during transfers; min assist for tub transfers; CGA-min A during ambulation (L knee tends to lock);  and noted WB favortism to R LE vs L LE. Goals for discharge set for min - mod I assist overall  *See Care Plan and progress notes for long and short-term goals.   Revisions to Treatment Plan:  N/a   Teaching Needs: Safety, transfers, toileting, medications, dietary modifications, etc.   Current Barriers to Discharge: Decreased caregiver support  Possible Resolutions to Barriers: Family education HH follow up services     Medical Summary Current Status: uncontrolled BP,cognition affected by multiple strokes  Barriers to Discharge: Hypotension;Uncontrolled Hypertension;Renal Insufficiency/Failure   Possible Resolutions to [employer] Focus: med management for BP   Continued Need for Acute Rehabilitation Level of Care: The patient requires  daily medical management by a physician with specialized training in physical medicine and rehabilitation for the following reasons: Direction of a multidisciplinary physical rehabilitation program to maximize functional independence : Yes Medical management of patient stability for increased activity during participation in an intensive rehabilitation regime.: Yes Analysis of laboratory values and/or radiology reports with any  subsequent need for medication adjustment and/or medical intervention. : Yes   I attest that I was present, lead the team conference, and concur with the assessment and plan of the team.   Dorien Chihuahua B 08/30/2022, 2:44 PM

## 2022-08-30 NOTE — Progress Notes (Signed)
Occupational Therapy Session Note  Patient Details  Name: Avin Chanley MRN: [id number] Date of Birth: 14-Jan-1971  Today's Date: 08/30/2022 OT Individual Time: 0915-1000 OT Individual Time Calculation (min): 45 min    Short Term Goals: Week 1:  OT Short Term Goal 1 (Week 1): Patient will feed herself with Mod I OT Short Term Goal 2 (Week 1): Patient will dress upper body with set up assistance OT Short Term Goal 3 (Week 1): Patient will don/doff pants with close supervision OT Short Term Goal 4 (Week 1): Patient will don/doff socks/shoes with min assist OT Short Term Goal 5 (Week 1): Patient will trasnfer to [facility] with min assist  Skilled Therapeutic Interventions/Progress Updates:    Pt received in bed ready tor a shower. She reports no dizziness and was able to demonstrate completing all tasks at a supervision level (getting out of bed, ambulating with RW to bathroom, undressing, toileting, showering, dressing).  The only exception, was she did need CGA exiting the bathroom as there was a small slope at the bathroom threshold. Pt needed light CGA for stability with the RW.   Pt feels she is moving her arms well, vision has improved, but continues to have difficulty with LLE due to pain. Pt resting in bed with all needs met and alarm set.   Therapy Documentation Precautions:  Precautions Precautions: Fall Precaution Comments: dizziness with position changes Restrictions Weight Bearing Restrictions: No    Vital Signs: Therapy Vitals Pulse Rate: 80 BP: (Abnormal) 172/100 Pain: Pain Assessment Pain Scale: 0-10 Pain Score: 0-No pain ADL: ADL Eating: Independent Where Assessed-Eating: Chair Grooming: Setup Where Assessed-Grooming: Chair Upper Body Bathing: Setup Where Assessed-Upper Body Bathing: Shower Lower Body Bathing: Supervision/safety Where Assessed-Lower Body Bathing: Shower Upper Body Dressing: Setup Where Assessed-Upper Body Dressing: Chair Lower Body  Dressing: Supervision/safety Where Assessed-Lower Body Dressing: Chair Toileting: Supervision/safety Where Assessed-Toileting: Glass blower/designer: Close supervision Toilet Transfer Method: Counselling psychologist: Grab bars Tub/Shower Transfer: Unable to assess Tub/Shower Transfer Method: Unable to assess [employer] Transfer: Close supervision Social research officer, government Method: [employer]: Grab bars, Transfer tub bench ADL Comments: has tub/shower at home      Therapy/Group: Individual Therapy  [place] 08/30/2022, 12:18 PM

## 2022-08-30 NOTE — Progress Notes (Signed)
PROGRESS NOTE   Subjective/Complaints:  No further dizziness  ROS: Has LLE weakness, no double vision, no numbness, no abd discomfort or breathing issues  Objective:   No results found. No results for input(s): "WBC", "HGB", "HCT", "PLT" in the last 72 hours.  No results for input(s): "NA", "K", "CL", "CO2", "GLUCOSE", "BUN", "CREATININE", "CALCIUM" in the last 72 hours.   Intake/Output Summary (Last 24 hours) at 08/30/2022 0934 Last data filed at 08/30/2022 0727 Gross per 24 hour  Intake 417 ml  Output 350 ml  Net 67 ml         Physical Exam: Vital Signs Blood pressure (!) 172/100, pulse 80, temperature 98 F (36.7 C), temperature source Oral, resp. rate 18, height 5\' 2"  (1.575 m), weight 68.8 kg, last menstrual period 05/22/2018, SpO2 100 %.  General: No acute distress Mood and affect are appropriate Heart: Regular rate and rhythm no rubs murmurs or extra sounds Lungs: Clear to auscultation, breathing unlabored, no rales or wheezes Abdomen: Positive bowel sounds, soft nontender to palpation, nondistended Extremities: No clubbing, cyanosis, or edema Skin: No evidence of breakdown, no evidence of rash   MSK:      No apparent deformity.      Strength: Right upper and lower extremity 5 out of 5 throughout.  Left  4-/5 in upper, 4+/5 in lower.   Neurologic exam:  Cognition: AAO to person, place, time and event Language: Fluent, No substitutions or neoglisms.  Mild dysarthria. Names 3/3 objects correctly.  Memory: Recalls 3/3 objects at 5 minutes. No apparent deficits  Insight: Good  insight into current condition.  Mood: Pleasant affect, appropriate mood.  Sensation: To light touch intact in BL UEs and LEs  Reflexes: 2+ in BL UE and LEs.    Negative Babinski. CN: Positive mild right decreased nasolabial fold.  Otherwise, intact. Coordination: Left upper extremity ataxia on FTN  Spasticity: MAS 0 in all  extremities.          Assessment/Plan: 1. Functional deficits which require 3+ hours per day of interdisciplinary therapy in a comprehensive inpatient rehab setting. Physiatrist is providing close team supervision and 24 hour management of active medical problems listed below. Physiatrist and rehab team continue to assess barriers to discharge/monitor patient progress toward functional and medical goals  Care Tool:  Bathing    Body parts bathed by patient: Right arm, Left arm, Chest, Abdomen, Front perineal area, Buttocks, Right upper leg, Left upper leg, Face         Bathing assist Assist Level: Contact Guard/Touching assist     Upper Body Dressing/Undressing Upper body dressing   What is the patient wearing?: Pull over shirt    Upper body assist Assist Level: Set up assist    Lower Body Dressing/Undressing Lower body dressing      What is the patient wearing?: Pants, Underwear/pull up     Lower body assist Assist for lower body dressing: Contact Guard/Touching assist     Toileting Toileting Toileting Activity did not occur (Clothing management and hygiene only): N/A (no void or bm)  Toileting assist Assist for toileting: Contact Guard/Touching assist     Transfers Chair/bed transfer  Transfers assist  Chair/bed transfer assist level: Minimal Assistance - Patient > 75%     Locomotion Ambulation   Ambulation assist      Assist level: Moderate Assistance - Patient 50 - 74% Assistive device: Walker-rolling Max distance: 100ft   Walk 10 feet activity   Assist  Walk 10 feet activity did not occur: Safety/medical concerns (2/2 dizziness)        Walk 50 feet activity   Assist Walk 50 feet with 2 turns activity did not occur: Safety/medical concerns (2/2 dizziness)         Walk 150 feet activity   Assist Walk 150 feet activity did not occur: Safety/medical concerns (2/2 dizziness)         Walk 10 feet on uneven surface   activity   Assist Walk 10 feet on uneven surfaces activity did not occur: Safety/medical concerns (2/2 dizziness)         Wheelchair     Assist Is the patient using a wheelchair?: No             Wheelchair 50 feet with 2 turns activity    Assist            Wheelchair 150 feet activity     Assist          Blood pressure (!) 172/100, pulse 80, temperature 98 F (36.7 C), temperature source Oral, resp. rate 18, height 5\' 2"  (1.575 m), weight 68.8 kg, last menstrual period 05/22/2018, SpO2 100 %.   Medical Problem List and Plan: 1. Functional deficits secondary to stroke  She presented to [facility] on 12/19/21 with left-sided weakness and visual deficits and MRI/MRA brain done revealing 1.9 cm acute ischemic nonhemorrhagic infarct in the deep white matter right frontal centrum ovale and advanced chronic microvascular disease.   New CVA 3/12 Left BG infarct               -patient may shower             -ELOS/Goals: 12-18 days, SPV PT/OT/SLP- Team conference today please see physician documentation under team conference tab, met with team  to discuss problems,progress, and goals. Formulized individual treatment plan based on medical history, underlying problem and comorbidities.                - Note, multiple prior history of stroke on both Brilinta, aspirin.  High risk for recurrence.Failed clopidigrel in past    2.  Antithrombotics: -DVT/anticoagulation:  Pharmaceutical: Lovenox             -antiplatelet therapy: Aspirin 81 mg daily and Brilinta 90 mg BID   3. Pain Management: Tylenol as needed   4. Mood/Behavior/Sleep: LCSW to evaluate and provide emotional support -generalized anxiety disorder             -depression: continue Cymbalta             -antipsychotic agents: n/a   5. Neuropsych/cognition: This patient is capable of making decisions on her own behalf.   6. Skin/Wound Care: Routine skin care checks   7.  Fluids/Electrolytes/Nutrition: Routine Is and Os and follow-up chemistries   8: Hypertension: monitor TID and prn   - Poorly controlled, still in range for permissive hypertension. add as needed hydralazine for systolic BPs greater than 180.     - 3/18: Will start amlodipine 2.5 mg daily (was on 5 mg at baseline)    08/30/2022    8:24 AM 08/30/2022    5:15 AM 08/29/2022  8:32 PM  Vitals with BMI  Systolic [id number]  Diastolic [id number] 84 74  Pulse 80 74 80   Adjust meds for   HTN increase amlodipine    9: Hyperlipidemia: continue statin   10: OSA: CPAP mask test>>passed first phase.  Will need to coordinate outpatient equipment/follow-up   11: Dizziness/vertigo:improved after diazepam d/ced  12: Asthma, chronic: home regimen: albuterol inhaler as needed, Singulair (not restarted)   13: DM2-controlled: CBGs q AC and q HS; home regimen includes Ozempic q 10 days             -trend CBGS and decide on carb modified diet, SSI, etc.    - Stable on admission labs, monitor   14: CKD IV: Baseline creatinine ~2.5-3.0             -follow-up BMP   - Uptrending BUN, creatinine stable, encourage p.o. fluids and recheck stable   15: Anemia of chronic disease: hemoglobin stable; follow-up CBC - stable    LOS: 4 days A FACE TO FACE EVALUATION WAS PERFORMED  Christina Orozco 08/30/2022, 9:34 AM

## 2022-08-30 NOTE — Progress Notes (Signed)
Physical Therapy Session Note  Patient Details  Name: Christina Orozco MRN: [id number] Date of Birth: Jul 05, 1970  Today's Date: 08/30/2022 PT Individual Time: 1120-1202 PT Individual Time Calculation (min): 42 min   Short Term Goals: Week 1:  PT Short Term Goal 1 (Week 1): STG = LTG 2/2 LOS  Skilled Therapeutic Interventions/Progress Updates: Patient supine in bed on entrance to room. Patient alert and agreeable to PT session.    Patient with no complaints of pain or dizziness at start of session. Patient was transferred in [facility] to day room for interventions.   Therapeutic Activity: Bed Mobility: Pt performed supine to sit with HOB elevated and supervision.  Transfers: Pt performed stand pivot transfer without use of AD to [facility] with CGA-min A for safety. Patient presented with forward flexed posture at the hips with use of B UE on WC seat. Patient instructed to stand upright during stand pivot transfers in the future as to prevent falling forward.  Gait Training:  Pt ambulated 70' on RW with CGA-mod A. Provided vc to stay close to RW and to decrease cadence. Therapist followed closely on rolling stool and guarded hips while facilitating L hamstrings and quads during respective portions of the gain cycle in  order to decrease genu recurvatum during gait. Patient had a sudden loss of balance while turning corner to the L, and presented with excessive R lateral lean and noted R LE off the ground. Therapist required to use mod-max assist in order to prevent patient from falling to the R side and to center standing balance. Therapist asked if patient was okay and took a few seconds to determine patient's status to keep ambulating. No overt signs and symptoms at that time were present. Patient stated to be okay and that she wanted to keep pushing to room. Patient ambulated a few more feet and then required an immediate sit down to WC. Patient noted to be 10/10 when asked how dizzy she was feeling and that  the room was spinning. Patient presented with blank stare and increased time to respond to therapist's questions. Patient transferred back to room and sat in [facility] before transferring to bed as to not increase dizziness with movement. Orthostatics checked at this time as noted below, and attending nurse was updated with status. Patient reported 4/10 dizziness shortly after and was transferred to bed with min-mod A.  Neuromuscular Re-ed: Sit to stand from [facility] without AD and R LE on 2" block (progressed to 4" block to increase WB to L LE). Therapist sat on rolling stool in front guarding at hips and L knee to prevent buckling or genu recurvatum. Use of mirror with line down the middle as to promote equal weight distribution in B LE. Therapist provided CGA - min A. Patient noted to have a great improvement with keeping equal weight distribution from previous session -> 1 x 10 (cues to stay in standing with equal weight distribution); 1 x 10 for endurance.    Patient left in R sidelying on bed, bed alarm on and all necessities within reach. Attending nurse alerted to check on patient 2/2 patient's dizziness.       Therapy Documentation Precautions:  Precautions Precautions: Fall Precaution Comments: dizziness with position changes Restrictions Weight Bearing Restrictions: No General:   Vital Signs: 80 BPM 96 SpO2 171/93  Therapy/Group: Individual Therapy  Earnie Bechard Dortha Kern, PTA 08/30/2022, 4:23 PM

## 2022-08-30 NOTE — Progress Notes (Signed)
Physical Therapy Session Note  Patient Details  Name: Christina Orozco MRN: [id number] Date of Birth: [birth year]  Today's Date: 08/30/2022 PT Individual Time: [id number] PT Individual Time Calculation (min): 62 min   Short Term Goals: Week 1:  PT Short Term Goal 1 (Week 1): STG = LTG 2/2 LOS  Skilled Therapeutic Interventions/Progress Updates:    Pt received supine w/ HOB elevated. Pt express that she had another dizzy episode while walking during last therapy session, however, was feeling better now and agreeable to PT services. Pt transferred to EOB w/ supervision A. Pt denied any dizziness. Performed sit<>stand w/ RW and CGA. Pt performed ~262ft of gait to therapy gym w/ RW and CGA. Therapist noted L lateral lean and increased LLE abduction. Vc for pt to shift weight more to the R and step laterally w/ L foot was demonstrated well by pt.   Pt engaged in weight shifting exercises to encourage motor control, balance, and increased LLE weight bearing. Pt instructed to step over colored dot 2x8 w/ R foot while WB through LLE. Cues to shift weight on to L foot, find balance then step w/ the R assisted in improving stability. 1x12 stepping over dot w/ L foot. Pt expressed worry when instructed to step w/ LLE. Therapist cued pt to shift weight on the R stance leg then step w/ the L. The cued to shift wt back to R foot to step back w/ the L. Cues assisted in improving motor control.   Pt performed stand pivot from EOM to [facility] and was transported back to room dependently and left seated in [facility] w/ chair alarm on, call bell in reach and all needs met.   Therapy Documentation Precautions:  Precautions Precautions: Fall Precaution Comments: dizziness with position changes Restrictions Weight Bearing Restrictions: No General:    Pain: pt denies any pain       Therapy/Group: Individual Therapy  Lennox Leikam 08/30/2022, 4:36 PM

## 2022-08-30 NOTE — Progress Notes (Signed)
Patient ID: Christina Orozco, female   DOB: 19-May-1971, 52 y.o.   MRN: [id number]  Team Conference Report to Patient/Family  Team Conference discussion was reviewed with the patient and caregiver, including goals, any changes in plan of care and target discharge date.  Patient and caregiver express understanding and are in agreement.  The patient has a target discharge date of 09/05/22.  SW met with patient, brother and spoke with daughter, via telephone. Patient still constipated and recently had a dizzy episode in her PT session. Daughter plans to attend family education on Monday 1-4PM and pt's brother will be out of town. No additional questions or concerns.  [name] 08/30/2022, 1:14 PM

## 2022-08-30 NOTE — Progress Notes (Signed)
Pt placed on home CPAP for the night 

## 2022-08-30 NOTE — Plan of Care (Signed)
  Problem: Consults Goal: RH STROKE PATIENT EDUCATION Description: See Patient Education module for education specifics  Outcome: Progressing   Problem: RH BOWEL ELIMINATION Goal: RH STG MANAGE BOWEL WITH ASSISTANCE Description: STG Manage Bowel with mod I Assistance. Outcome: Progressing Goal: RH STG MANAGE BOWEL W/MEDICATION W/ASSISTANCE Description: STG Manage Bowel with Medication with mod I Assistance. Outcome: Progressing   Problem: RH SAFETY Goal: RH STG ADHERE TO SAFETY PRECAUTIONS W/ASSISTANCE/DEVICE Description: STG Adhere to Safety Precautions With cues Assistance/Device. Outcome: Progressing   Problem: RH PAIN MANAGEMENT Goal: RH STG PAIN MANAGED AT OR BELOW PT'S PAIN GOAL Description: < 4 with prns Outcome: Progressing   Problem: RH KNOWLEDGE DEFICIT Goal: RH STG INCREASE KNOWLEDGE OF HYPERTENSION Description: Patient and family will be able to manage HTN with medications and dietary modifications using educational resources independently Outcome: Progressing Goal: RH STG INCREASE KNOWLEGDE OF HYPERLIPIDEMIA Description: Patient and family will be able to manage HLD with medications and dietary modifications using educational resources independently Outcome: Progressing Goal: RH STG INCREASE KNOWLEDGE OF STROKE PROPHYLAXIS Description: Patient and family will be able to manage secondary risks with medications and dietary modifications using educational resources independently Outcome: Progressing   Problem: Education: Goal: Knowledge of disease or condition will improve Outcome: Progressing Goal: Knowledge of secondary prevention will improve (MUST DOCUMENT ALL) Outcome: Progressing Goal: Knowledge of patient specific risk factors will improve [place] N/A or DELETE if not current risk factor) Outcome: Progressing   Problem: Ischemic Stroke/TIA Tissue Perfusion: Goal: Complications of ischemic stroke/TIA will be minimized Outcome: Progressing   Problem:  Coping: Goal: Will verbalize positive feelings about self Outcome: Progressing Goal: Will identify appropriate support needs Outcome: Progressing   Problem: Health Behavior/Discharge Planning: Goal: Ability to manage health-related needs will improve Outcome: Progressing Goal: Goals will be collaboratively established with patient/family Outcome: Progressing   Problem: Self-Care: Goal: Ability to participate in self-care as condition permits will improve Outcome: Progressing Goal: Verbalization of feelings and concerns over difficulty with self-care will improve Outcome: Progressing Goal: Ability to communicate needs accurately will improve Outcome: Progressing   Problem: Nutrition: Goal: Risk of aspiration will decrease Outcome: Progressing Goal: Dietary intake will improve Outcome: Progressing

## 2022-08-31 NOTE — Progress Notes (Signed)
PROGRESS NOTE   Subjective/Complaints:  No issues overnite, still having dizziness with quick turns   ROS: Has LLE weakness, no double vision, no numbness, no abd discomfort or breathing issues  Objective:   No results found. No results for input(s): "WBC", "HGB", "HCT", "PLT" in the last 72 hours.  No results for input(s): "NA", "K", "CL", "CO2", "GLUCOSE", "BUN", "CREATININE", "CALCIUM" in the last 72 hours.   Intake/Output Summary (Last 24 hours) at 08/31/2022 0852 Last data filed at 08/30/2022 1806 Gross per 24 hour  Intake 354 ml  Output --  Net 354 ml         Physical Exam: Vital Signs Blood pressure (!) 161/93, pulse 75, temperature 98 F (36.7 C), resp. rate 18, height 5\' 2"  (1.575 m), weight 68.8 kg, last menstrual period 05/22/2018, SpO2 100 %.  General: No acute distress Mood and affect are appropriate Heart: Regular rate and rhythm no rubs murmurs or extra sounds Lungs: Clear to auscultation, breathing unlabored, no rales or wheezes Abdomen: Positive bowel sounds, soft nontender to palpation, nondistended Extremities: No clubbing, cyanosis, or edema Skin: No evidence of breakdown, no evidence of rash   MSK:      No apparent deformity.      Strength: Right upper and lower extremity 5 out of 5 throughout.  Left  4-/5 in upper, 4+/5 in lower.   Neurologic exam:  Cognition: AAO to person, place, time and event Language: Fluent, No substitutions or neoglisms.  Mild dysarthria. Names 3/3 objects correctly.  Memory: Recalls 3/3 objects at 5 minutes. No apparent deficits  Insight: Good  insight into current condition.  Mood: Pleasant affect, appropriate mood.  Sensation: To light touch intact in BL UEs and LEs  Reflexes: 2+ in BL UE and LEs.    Negative Babinski. CN: Positive mild right decreased nasolabial fold.  Otherwise, intact. Coordination: Left upper extremity ataxia on FTN  Spasticity: MAS 0 in  all extremities.          Assessment/Plan: 1. Functional deficits which require 3+ hours per day of interdisciplinary therapy in a comprehensive inpatient rehab setting. Physiatrist is providing close team supervision and 24 hour management of active medical problems listed below. Physiatrist and rehab team continue to assess barriers to discharge/monitor patient progress toward functional and medical goals  Care Tool:  Bathing    Body parts bathed by patient: Right arm, Left arm, Chest, Abdomen, Front perineal area, Buttocks, Right upper leg, Left upper leg, Face         Bathing assist Assist Level: Contact Guard/Touching assist     Upper Body Dressing/Undressing Upper body dressing   What is the patient wearing?: Pull over shirt    Upper body assist Assist Level: Set up assist    Lower Body Dressing/Undressing Lower body dressing      What is the patient wearing?: Pants, Underwear/pull up     Lower body assist Assist for lower body dressing: Contact Guard/Touching assist     Toileting Toileting Toileting Activity did not occur (Clothing management and hygiene only): N/A (no void or bm)  Toileting assist Assist for toileting: Contact Guard/Touching assist     Transfers Chair/bed transfer  Transfers assist     Chair/bed transfer assist level: Minimal Assistance - Patient > 75%     Locomotion Ambulation   Ambulation assist      Assist level: Moderate Assistance - Patient 50 - 74% Assistive device: Walker-rolling Max distance: 64ft   Walk 10 feet activity   Assist  Walk 10 feet activity did not occur: Safety/medical concerns (2/2 dizziness)        Walk 50 feet activity   Assist Walk 50 feet with 2 turns activity did not occur: Safety/medical concerns (2/2 dizziness)         Walk 150 feet activity   Assist Walk 150 feet activity did not occur: Safety/medical concerns (2/2 dizziness)         Walk 10 feet on uneven surface   activity   Assist Walk 10 feet on uneven surfaces activity did not occur: Safety/medical concerns (2/2 dizziness)         Wheelchair     Assist Is the patient using a wheelchair?: No             Wheelchair 50 feet with 2 turns activity    Assist            Wheelchair 150 feet activity     Assist          Blood pressure (!) 161/93, pulse 75, temperature 98 F (36.7 C), resp. rate 18, height 5\' 2"  (1.575 m), weight 68.8 kg, last menstrual period 05/22/2018, SpO2 100 %.   Medical Problem List and Plan: 1. Functional deficits secondary to stroke  She presented to [facility] on 12/19/21 with left-sided weakness and visual deficits and MRI/MRA brain done revealing 1.9 cm acute ischemic nonhemorrhagic infarct in the deep white matter right frontal centrum ovale and advanced chronic microvascular disease.   New CVA 3/12 Left BG infarct               -patient may shower             -ELOS/Goals: 12-18 days, SPV PT/OT/SLP-               - Note, multiple prior history of stroke on both Brilinta, aspirin.  High risk for recurrence.Failed clopidigrel in past    2.  Antithrombotics: -DVT/anticoagulation:  Pharmaceutical: Lovenox             -antiplatelet therapy: Aspirin 81 mg daily and Brilinta 90 mg BID   3. Pain Management: Tylenol as needed   4. Mood/Behavior/Sleep: LCSW to evaluate and provide emotional support -generalized anxiety disorder             -depression: continue Cymbalta             -antipsychotic agents: n/a   5. Neuropsych/cognition: This patient is capable of making decisions on her own behalf.   6. Skin/Wound Care: Routine skin care checks   7. Fluids/Electrolytes/Nutrition: Routine Is and Os and follow-up chemistries   8: Hypertension: monitor TID and prn   - Poorly controlled, still in range for permissive hypertension. add as needed hydralazine for systolic BPs greater than 180.     - 3/18: Will start amlodipine 2.5 mg  daily (was on 5 mg at baseline)    08/31/2022    5:24 AM 08/30/2022    7:35 PM 08/30/2022    1:02 PM  Vitals with BMI  Systolic [id number]  Diastolic 93 84 94  Pulse 75 95 92   Adjust meds for  HTN increase amlodipine 5mg  qd on 3/21   9: Hyperlipidemia: continue statin   10: OSA: CPAP mask test>>passed first phase.  Will need to coordinate outpatient equipment/follow-up   11: Dizziness/vertigo:improved after diazepam d/ced  12: Asthma, chronic: home regimen: albuterol inhaler as needed, Singulair (not restarted)   13: DM2-controlled: CBGs q AC and q HS; home regimen includes Ozempic q 10 days             -trend CBGS and decide on carb modified diet, SSI, etc.    - Stable on admission labs, monitor   14: CKD IV: Baseline creatinine ~2.5-3.0             -follow-up BMP   - Uptrending BUN, creatinine stable, encourage p.o. fluids and recheck stable   15: Anemia of chronic disease: hemoglobin stable; follow-up CBC - stable    LOS: 5 days A FACE TO FACE EVALUATION WAS PERFORMED  Charlett Blake 08/31/2022, 8:52 AM

## 2022-08-31 NOTE — Progress Notes (Signed)
Occupational Therapy Session Note  Patient Details  Name: Christina Orozco MRN: [id number] Date of Birth: 1971/04/12  Today's Date: 08/31/2022 OT Individual Time: [id number] OT Individual Time Calculation (min): 70 min    Short Term Goals: Week 1:  OT Short Term Goal 1 (Week 1): Patient will feed herself with Mod I OT Short Term Goal 2 (Week 1): Patient will dress upper body with set up assistance OT Short Term Goal 3 (Week 1): Patient will don/doff pants with close supervision OT Short Term Goal 4 (Week 1): Patient will don/doff socks/shoes with min assist OT Short Term Goal 5 (Week 1): Patient will trasnfer to [facility] with min assist  Skilled Therapeutic Interventions/Progress Updates:    Pt received in bed ready for therapy.  Pt worked on moving slowly to avoid inducing dizziness. Also discussed that she should practice turning her head and focusing her eyes before turning her body if walking and making a big turn. Pt was able to put this into practice as she got out of bed and moved about her room with RW. Pt sat in armless chair to retrieve clothing from suitcase.  She then ambulated to sink to stand and brush teeth with slight wavering in her balance but no LOB as she stood for oral care.  Then sat back on bed to meet with MD briefly. She then continued to ambulate to toilet with RW with close S to toilet, then was able to get in and out of shower with S.  Sat back on toilet to dress. Pt then ambulated to wc. Pt taken to  ADL apt to practice tub transfers but room was occupied.  Will practice tomorrow.  Pt returned to room.  Upgraded some goals due to good progress. Mod I with LB dressing, supervision with IADLs, CGA with getting into tub   Pt resting in w/c with all needs met.  Therapy Documentation Precautions:  Precautions Precautions: Fall Precaution Comments: dizziness with position changes Restrictions Weight Bearing Restrictions: No    Pain: c/o mild back pain only when  standing still    ADL: ADL Eating: Independent Where Assessed-Eating: Chair Grooming: Setup Where Assessed-Grooming: Chair Upper Body Bathing: Setup Where Assessed-Upper Body Bathing: Shower Lower Body Bathing: Supervision/safety Where Assessed-Lower Body Bathing: Shower Upper Body Dressing: Setup Where Assessed-Upper Body Dressing: Chair Lower Body Dressing: Supervision/safety Where Assessed-Lower Body Dressing: Chair Toileting: Supervision/safety Where Assessed-Toileting: Glass blower/designer: Close supervision Toilet Transfer Method: Counselling psychologist: Grab bars Tub/Shower Transfer: Unable to assess Tub/Shower Transfer Method: Unable to assess [employer] Transfer: Close supervision Social research officer, government Method: [employer]: Grab bars, Transfer tub bench ADL Comments: has tub/shower at home    Therapy/Group: Individual Therapy  [place] 08/31/2022, 9:25 AM

## 2022-08-31 NOTE — Consult Note (Signed)
Neuropsychological Consultation Comprehensive Inpatient Rehab   Patient:   Christina Orozco   DOB:   March 22, 1971  MR Number:  [id number]  Location:  [place] [facility] [place] [id number] [place] [address] Dept: [place]: [phone]           Date of Service:   08/30/2022  Start Time:   2 PM End Time:   3 PM  Provider/Observer:  Ilean Skill, Psy.D.       Clinical Neuropsychologist       Billing Code/Service: [id number]  Reason for Service:    Christina Orozco is a 52 year old female referred for neuropsychological consultation as part of her broader rehabilitative efforts while she is admitted to the comprehensive inpatient rehabilitation program.  Patient has a past medical history that includes significant hypertension, hyperlipidemia, controlled type 2 diabetes and prior history of multiple strokes/TIA.  Patient also with history of depression currently on Cymbalta and continuing meds on unit.  Patient also has previous diagnosis of obstructive sleep apnea and was on CPAP prior.  Recently, the patient had requirement for repeat sleep study to get a new CPAP machine and sleep study was not definitive.  Sleep apnea machine was not renewed.  However, during her care and concern about obstructive sleep apnea the patient consented to for dissipation in sleep smart study and had further sleep study with identification of obstructive sleep apnea and is now back on a CPAP device.  With the most recent vascular event, the patient presented to the emergency department on 08/22/2022 via EMS complaining of left-sided weakness, dizziness and aphasia.  CT head and CTA imaging were initially negative but MRI brain identified punctuate focus of acute/early subacute ischemia at the lateral margins of the left basal ganglia.  Patient had already been on anticoagulants with plan to use aspirin and Brilinta for 4 weeks and  then stop Brilinta and stay on aspirin.  During the clinical interview today, the patient was engaged and active in discussions.  She acknowledged history of depression and felt that Cymbalta had been helpful for her mood state.  Patient acknowledged difficulties with adjusting and coping with most recent vascular event but she had had multiple events before.  Patient verbalized concern that she has not been able to have cataract surgery on her eyes because of previous stroke events and that they had insisted that she be 6 months post stroke event before any other surgery and she was concerned that this most recent event reset the clock.  She understood concerns around surgery associated with anticoagulant medication.  Patient reports that she has continued to have residual effects of previous stroke/TIAs.  Patient reports that she is improving with her motor functions on unit and her dizziness and aphasia have improved.  Patient did display some aspect of slurred speech but was able to produce adequate expressive language functions.  There is no indication of receptive language deficits.  HPI for the current admission:    HPI: Christina Orozco is a 52 year old female who presented to the ED on 08/22/2022 via EMS with complaints of left-sided weakness, dizziness and aphasia. Code stroke called and evaluated by neurology. CT head and CTA imaging were negative. Physical exam significant for motor 5 out of 5 in all extremities, no drift seen.  However, left foot 0/5 dorsi flexion and plantarflexion as patient unable to flex/extend foot at all. Positive LUE ataxia. MRI brain with punctate focus of acute/early  subacute ischemia at the lateral margin of the left BG. Initiated Lovenox for DVT prophylaxis. She was on aspirin and Brilinta 60 mg BID prior to admission due to recurrent stroke 11/2021. [facility] at [facility] neurology.  Neurology recommended aspirin and Brilinta for four weeks then stop the Brilinta and stay on  aspirin alone. Her past medical history is significant for hypertension on Norvasc and Coreg, hyperlipidemia on atorvastatin 80 mg, DM2-controlled (A1c 5.9%), prior history of multiple strokes/TIA, GERD, depression on Cymbalta. She appears to be at risk for sleep apnea and is interested in considering participation in the sleep smart study.  She was given written information to review and decided to consent. CPAP tolerability test completed 3/15-3/16. She is tolerating regular diet. The patient continued to display a L lateral lean when sitting EOB, needing modA to prevent LOB, but was responding more clearly but with a delay as the session progressed. Per PT notes on 3/15, the patient stood to take steps towards [facility] to return to supine for her safety, needing modAx2 for all functional mobility. By the end of the session, she continued to display progress in her speech clarity and response speed, but was still delayed. The patient requires inpatient medicine and rehabilitation evaluations and services for ongoing dysfunction secondary to right hemispheric TIA, prior old strokes.   Medical History:   Past Medical History:  Diagnosis Date   Anemia    severe   Anxiety    Asthma    CKD (chronic kidney disease)    stage IV   DDD (degenerative disc disease), cervical    Depression    Diabetes mellitus (HCC)    Dislocated shoulder    right   GERD (gastroesophageal reflux disease)    Headache    migraines (about once a month)   History of kidney stones    Hypertension    Neuropathy    Peripheral vascular disease ([place])    Pneumonia    Stroke [facility])          Patient Active Problem List   Diagnosis Date Noted   OSA (obstructive sleep apnea) 08/25/2022   Acute CVA (cerebrovascular accident) ([place]) 08/23/2022   Overweight (BMI 25.0-29.9) 08/23/2022   Asthma, chronic 08/22/2022   TIA (transient ischemic attack) 06/20/2022   Numbness and tingling in left hand 03/02/2022   Gait disturbance,  post-stroke 03/02/2022   AKI (acute kidney injury) ([place]) [id number]   Acute embolic stroke ([place]) [id number]   Hypernatremia 12/20/2021   Chronic kidney disease (CKD), stage IV (severe) ([place]) 12/19/2021   Left-sided weakness and visual deficits  12/19/2021   Vision abnormalities 12/19/2021   Acute stroke due to ischemia ([place]) 11/10/2021   Depression 07/05/2020   CVA (cerebral vascular accident) ([place]) 06/21/2020   CVA (cerebrovascular accident) ([place]) 06/19/2020   Generalized anxiety disorder 03/01/2019   Abnormality of gait 10/14/2018   Dizziness and giddiness 09/13/2018   Debility 06/26/2018   Chronic nonintractable headache 0000000   Diastolic dysfunction    Congestion of nasal sinus    Pneumonia of left lower lobe due to infectious organism    SOB (shortness of breath)    Candidiasis    Anemia    Hypoalbuminemia due to protein-calorie malnutrition ([place])    Hypertension    Type 2 diabetes mellitus with chronic kidney disease, without long-term current use of insulin (HCC)    PRES (posterior reversible encephalopathy syndrome)    Hypokalemia    History of CVA with residual deficit    History of seizure  03/25/2018   Bilateral carpal tunnel syndrome 02/14/2018   Arthritis 10/31/2017   Asthma 10/31/2017   Constipation 10/31/2017   Kidney stone 10/31/2017   Anemia, chronic disease 08/09/2017   Hyperlipidemia 07/26/2017   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[place]) 07/25/2017   Anxiety as acute reaction to exceptional stress 04/23/2017   Insomnia 10/08/2016   Neuropathy 06/07/2016   Gait instability    Lesion of pons    Slurred speech    Vertigo 04/30/2016   Hyperglycemia 04/30/2016   Chronic kidney disease (CKD), stage III (moderate) ([place]) 04/30/2016   Ataxia 04/30/2016   Chronic right shoulder pain 04/03/2016   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 her dress was Appropriate and she was Well Groomed and her manners were Appropriate to the situation.  her participation was indicative of Appropriate behaviors.  There were physical disabilities noted.  she displayed an appropriate level of cooperation and motivation.    Interactions:    Active Appropriate  Attention:   abnormal and attention span appeared shorter than expected for age  Memory:   within normal limits; recent and remote memory intact  Visuo-spatial:   not examined  Speech (Volume):  normal  Speech:   normal; slurred  Thought Process:  Coherent and Relevant  Coherent  Though Content:  WNL; not suicidal and not homicidal  Orientation:   person, place, time/date, and situation  Judgment:   Fair  Planning:   Fair  Affect:    Anxious  Mood:    Euthymic  Insight:   Good  Intelligence:   normal  Psychiatric History:  Patient with prior history of depressive disorder and anxiety symptoms with SSRI/SNRI (Cymbalta) interventions.  Family Med/Psych History:  Family History  Problem Relation Age of Onset   Vascular Disease Mother    CAD Mother    Heart failure Mother    Heart disease Other    Cancer Other        colon, stomach, pancreatic, lung   Diabetes Other    Breast cancer Sister 78   Seizures Maternal Uncle     Risk of Suicide/Violence: virtually non-existent patient denies any suicidal or homicidal ideation.    Impression/DX:   Nicoleta Demmitt is a 52 year old female referred for neuropsychological consultation as part of her broader rehabilitative efforts while she is admitted to the comprehensive inpatient rehabilitation program.  Patient has a past medical history that includes significant hypertension,  hyperlipidemia, controlled type 2 diabetes and prior history of multiple strokes/TIA.  Patient also with history of depression currently on Cymbalta and continuing meds on unit.  Patient also has previous diagnosis of obstructive sleep apnea and was on CPAP prior.  Recently, the patient had requirement for repeat sleep study to get a new CPAP machine and sleep study was not definitive.  Sleep apnea machine was not renewed.  However, during her care and concern about obstructive sleep apnea the patient consented to for dissipation in sleep smart study and had further sleep study with identification of obstructive sleep apnea and is now back on a CPAP device.  With the most recent vascular event, the patient presented to the emergency department on 08/22/2022 via EMS complaining of left-sided  weakness, dizziness and aphasia.  CT head and CTA imaging were initially negative but MRI brain identified punctuate focus of acute/early subacute ischemia at the lateral margins of the left basal ganglia.  Patient had already been on anticoagulants with plan to use aspirin and Brilinta for 4 weeks and then stop Brilinta and stay on aspirin.  During the clinical interview today, the patient was engaged and active in discussions.  She acknowledged history of depression and felt that Cymbalta had been helpful for her mood state.  Patient acknowledged difficulties with adjusting and coping with most recent vascular event but she had had multiple events before.  Patient verbalized concern that she has not been able to have cataract surgery on her eyes because of previous stroke events and that they had insisted that she be 6 months post stroke event before any other surgery and she was concerned that this most recent event reset the clock.  She understood concerns around surgery associated with anticoagulant medication.  Patient reports that she has continued to have residual effects of previous stroke/TIAs.  Patient reports that  she is improving with her motor functions on unit and her dizziness and aphasia have improved.  Patient did display some aspect of slurred speech but was able to produce adequate expressive language functions.  There is no indication of receptive language deficits.  Disposition/Plan:  Today we worked on coping and adjustment issues with recent stroke events in the setting of history of multiple prior strokes/TIAs.  Diagnosis:    Moderate major depressive disorder recurrent         Electronically Signed   _______________________ Ilean Skill, Psy.D. Clinical Neuropsychologist

## 2022-08-31 NOTE — Plan of Care (Signed)
  Problem: RH Memory Goal: LTG Patient will use memory compensatory aids to (SLP) Description: LTG:  Patient will use memory compensatory aids to recall biographical/new, daily complex information with cues (SLP) Outcome: Completed/Met   Problem: RH Problem Solving Goal: LTG Patient will demonstrate problem solving for (SLP) Description: LTG:  Patient will demonstrate problem solving for basic/complex daily situations with cues  (SLP) Outcome: Completed/Met   Problem: RH Attention Goal: LTG Patient will demonstrate this level of attention during functional activites (SLP) Description: LTG:  Patient will will demonstrate this level of attention during functional activites (SLP) Outcome: Completed/Met

## 2022-08-31 NOTE — Progress Notes (Signed)
Occupational Therapy Session Note  Patient Details  Name: Christina Orozco MRN: [id number] Date of Birth: 03/02/1971  Today's Date: 08/31/2022 OT Individual Time: 1333-1415 OT Individual Time Calculation (min): 42 min    Short Term Goals: Week 1:  OT Short Term Goal 1 (Week 1): Patient will feed herself with Mod I OT Short Term Goal 2 (Week 1): Patient will dress upper body with set up assistance OT Short Term Goal 3 (Week 1): Patient will don/doff pants with close supervision OT Short Term Goal 4 (Week 1): Patient will don/doff socks/shoes with min assist OT Short Term Goal 5 (Week 1): Patient will trasnfer to [facility] with min assist  Skilled Therapeutic Interventions/Progress Updates:     Pt received  semi reclined in bed resenting to be in good spirits and receptive to skilled OT session with a focus on functional transfer training, functional mobility, and LB strengthening. Pt reporting 0/10 pain this session. Pt transitioned to EOB with supervision using bed features. Upon coming to sitting Pt reporting dizziness with education provided on strategies to prevent onset of dizziness such as moving slowly and focusing eyes on target when coming to seated position. Pt completed functional mobility in her room using RW CGA and retrieved jacket form closet with single arm support on RW. Transferred to wc and donned jacket in sitting. Pt transported to total A in wc to outdoor location for session to practice simulated community mobility and functional mobility across uneven surfaces. Pt able completed functional mobility across uneven ground, gravel, small inclines, and over small curbs using her RW with supervision. VB cues required for step height on L side as Pt tend to drag toes with fatigue and head positioning as Pt prefers to fixate gaze towards ground. Pt then completed transfers <> community benches with close supervision good safety awareness and body mechanics during tasks. Facilitated Pt  practicing sit<>stands from bench without UE support completing 5 trials with close supervision and cueing for technique. Discussed and educated Pt on fall prevention techniques as Pt reporting fear of falling. Discussed practicing fall recovery in future session with Pt receptive. Transported Pt back to room total A in wc for energy conservation and Pt was left resting in her wc with call bell in reach, chair alarm on, and all needs met.    Therapy Documentation Precautions:  Precautions Precautions: Fall Precaution Comments: dizziness with position changes Restrictions Weight Bearing Restrictions: No General:   Vital Signs: Therapy Vitals Temp: 98 F (36.7 C) Temp Source: Oral Pulse Rate: 79 Resp: 20 BP: (Abnormal) 183/75 Patient Position (if appropriate): Sitting Oxygen Therapy SpO2: 100 % O2 Device: Room Air   Therapy/Group: Individual Therapy  Janey Genta 08/31/2022, 3:49 PM

## 2022-08-31 NOTE — Progress Notes (Signed)
Speech Language Pathology Discharge Summary  Patient Details  Name: Christina Orozco MRN: [id number] Date of Birth: 12-19-1970  Date of Discharge from [place] service:August 31, 2022  Today's Date: 08/31/2022 SLP Individual Time: 1005-1033 SLP Individual Time Calculation (min): 28 min   Skilled Therapeutic Interventions:  Skilled treatment session focused on cognitive goals. SLP facilitated session by administering the COGNISTAT with patient scoring WFL on all subtests with the exception of visual construction that was not administered. Patient with a history of memory deficits at baseline which she independently utilizes strategies to compensate. Suspect patient is at her cognitive baseline, therefore, she will be discharged from skilled SLP intervention. Patient verbalized understanding and agreement with all education completed. Patient handed off to PT.   Patient has met 4 of 4 long term goals.  Patient to discharge at overall Modified Independent;Supervision level.   Reasons goals not met: N/A   Clinical Impression/Discharge Summary: Patient has made excellent gains and has met 4 of 4 LTGs this admission. Currently, patient is consuming regular textures with thin liquids without overt s/s of aspiration with Mod I for use of swallowing compensatory strategies. Patient also requires overall intermittent supervision verbal cues-Mod I to complete functional and mildly complex tasks safely in regards to complex problem solving, recall with use of strategies and selective attention. Patient reports she is at her cognitive baseline, therefore, skilled SLP intervention is no longer warranted at this time. All education completed. Patient did report she was receiving home health PT prior to hospitalization and  requested home health SLP services upon discharge.   Care Partner:  Caregiver Able to Provide Assistance: Yes  Type of Caregiver Assistance: Physical;Cognitive  Recommendation:  Home Health  SLP  Rationale for SLP Follow Up: Maximize cognitive function and independence   Equipment: N/A   Reasons for discharge: Treatment goals met   Patient/Family Agrees with Progress Made and Goals Achieved: Yes    Marks Scalera, [place] 08/31/2022, 3:10 PM

## 2022-08-31 NOTE — Plan of Care (Signed)
  Problem: RH Dressing Goal: LTG Patient will perform lower body dressing w/assist (OT) Description: LTG: Patient will perform lower body dressing with assist, with/without cues in positioning using equipment (OT) Flowsheets (Taken 08/31/2022 0926) LTG: Pt will perform lower body dressing with assistance level of: (LTG upgraded due to progress) Independent with assistive device Note: LTG upgraded due to progress    Problem: RH Functional Use of Upper Extremity Goal: LTG Patient will use RT/LT upper extremity as a (OT) Description: LTG: Patient will use right/left upper extremity as a stabilizer/gross assist/diminished/nondominant/dominant level with assist, with/without cues during functional activity (OT) Flowsheets (Taken 08/31/2022 0926) LTG: Pt will use upper extremity in functional activity with assistance level of: (LTG upgraded due to progress) Independent Note: LTG upgraded due to progress    Problem: RH Simple Meal Prep Goal: LTG Patient will perform simple meal prep w/assist (OT) Description: LTG: Patient will perform simple meal prep with assistance, with/without cues (OT). Flowsheets (Taken 08/31/2022 0926) LTG: Pt will perform simple meal prep with assistance level of: (LTG upgraded due to progress) Supervision/Verbal cueing Note: LTG upgraded due to progress    Problem: RH Tub/Shower Transfers Goal: LTG Patient will perform tub/shower transfers w/assist (OT) Description: LTG: Patient will perform tub/shower transfers with assist, with/without cues using equipment (OT) Flowsheets (Taken 08/31/2022 0926) LTG: Pt will perform tub/shower stall transfers with assistance level of: (LTG upgraded due to progress) Contact Guard/Touching assist Note: LTG upgraded due to progress

## 2022-08-31 NOTE — Progress Notes (Signed)
Physical Therapy Session Note  Patient Details  Name: Christina Orozco MRN: [id number] Date of Birth: 1970/10/03  Today's Date: 08/31/2022 PT Individual Time: 1033-1103 PT Individual Time Calculation (min): 30 min   Short Term Goals: Week 1:  PT Short Term Goal 1 (Week 1): STG = LTG 2/2 LOS  Skilled Therapeutic Interventions/Progress Updates:   First session:  Pt presents sitting in w/c and handed off from [place].  Pt agreeable to therapy.  Pt donned shoes sitting in w/c.  Pt transfers sit to stand w/ CGA.  Pt amb 150' w/ RW and CGA to Dayroom w/ occasional scissoring type gait, no LOB.  Pt amb to Nu-step and performed x 7 min at Level 4, average 36 spm and total of 229 steps.  Pt states that this is the most her knees have worked.  Pt amb back to room and returned to w/c w/ seat alarm on and all needs in reach.  Second session:  Pt presents sitting in w/c and agreeable to therapy.  Pt wishes to go outside.  Pt wheeled to [facility] entrance for energy conservation.  Pt transfers sist to stand w/ CGA.  Pt amb 100-150' w/ RW and CGA, w/ occasional missteps/unsteadiness on uneven surfaces.  Pt amb w/ HHA and min A across uneven surfaces.  Pt wheeled to ADL suite BR.  Pt performed bathtub transfer using grab bars and CGA to lower to bottom and then to stand in a dry tub.  Pt aware of need to sit on tub lip and turn legs into /out of tub.  Pt performed horseshoe toss w/ B hands, crossing midline w/o LOB.  Pt amb x 25' w/ RW into room and to bed.  Pt transfers sit to supine w/ supervision.  Bed alarm on and all needs in reach, daughter present.     Therapy Documentation Precautions:  Precautions Precautions: Fall Precaution Comments: dizziness with position changes Restrictions Weight Bearing Restrictions: No General:   Vital Signs:  Pain:0/10        Therapy/Group: Individual Therapy  [name] 08/31/2022, 12:16 PM

## 2022-09-01 NOTE — Progress Notes (Signed)
Physical Therapy Session Note  Patient Details  Name: Christina Orozco MRN: [id number] Date of Birth: [birth year]  Today's Date: 09/01/2022 PT Individual Time: [id number] PT Individual Time Calculation (min): 50 min   Short Term Goals: Week 1:  PT Short Term Goal 1 (Week 1): STG = LTG 2/2 LOS  Skilled Therapeutic Interventions/Progress Updates: Patient greeted in [facility] on entrance to room. Patient alert and agreeable to PT session.   Patient with no pain complaints at start of session. Gout on R knee started to cause 5/10 pain during sit to stand intervention, and then increased shortly after to 6/10. Rest breaks provided prn as to not aggravate pain, and attending nurse alerted at the end of the session to provide patient with pain medication.  Therapeutic Activity: Bed Mobility: Patient went from EOB to R sidelying with supervision for safety due to pain at the end of the PT session.  Transfers: Pt performed sit to stand and pivot from WC to EOB with min A 2/2 increased pain at the end of the session. VC required for controlled eccentric sit to EOB. Pt performed sit to stand and stand pivot transfers to rollator throughout session with supervision. Patient performed safe rollator management by demonstrating proper sequence prior to standing (locking brakes, and close proximity to rollator).  Gait Training:  Pt ambulated 200'+ using rollator from main gym to day room and around nurse station with CGA + 1 WC follow for safety, then progressed to closed supervision with WC +1 for safety. Provided min vc for patient to decrease cadence.   Wheelchair Mobility:  Pt propelled wheelchair 50' feet with supervision from room to nurse station. Provided vc to avoid wheeling into wall on L side. Patient then transported in [facility] to main gym.   Neuromuscular Re-ed: NMR facilitated during session with focus on WB on L LE, and challenging single leg stance on L LE. Pt guided in R toe taps to cone at first, but  had to move to line over tile 6" away from base 2/2 patient presenting with increase L lateral lean on therapist, and inability to safely perform intervention. Patient performed 8 reps of R toe taps over line 6" away from base with min-mod A. Provided multimodal cues for patient to avoid L lateral lean and reliance on therapist to keep patient upright. Patient required increased time to complete intervention.  Sit to stand from hi/low mat with R LE on 4" block to promote WB on L LE for 10reps. Patient progressed to 6" block and performed 10reps, after which patient reported increased pain in R knee 2/2 gout. Patient required CGA - supervision during intervention with vc required to control eccentric sit, and to keep B UE in lap as to avoid using them to control eccentric sit. NMR performed for improvements in motor control and coordination, balance, sequencing, judgement, and self confidence/ efficacy in performing all aspects of mobility at highest level of independence.   Patient transported to room and left R sidelying in bed at end of session with brakes locked, bed alarm set, and all needs within reach. Attending nurse also alerted at this time per patient request for pain medication.       Therapy Documentation Precautions:  Precautions Precautions: Fall Precaution Comments: dizziness with position changes Restrictions Weight Bearing Restrictions: No Pain: 5/10 R knee 2/2 gout (during sit to stand) 6/10 R knee 2/2 gout (during sit to stand)  Therapy/Group: Individual Therapy  Khayri Kargbo Dortha Kern, PTA 09/01/2022, 3:49 PM

## 2022-09-01 NOTE — Progress Notes (Signed)
PROGRESS NOTE   Subjective/Complaints:  Appreciate neuropsych note  ROS: Has LLE weakness, no double vision, no numbness, no abd discomfort or breathing issues  Objective:   No results found. No results for input(s): "WBC", "HGB", "HCT", "PLT" in the last 72 hours.  No results for input(s): "NA", "K", "CL", "CO2", "GLUCOSE", "BUN", "CREATININE", "CALCIUM" in the last 72 hours.   Intake/Output Summary (Last 24 hours) at 09/01/2022 0813 Last data filed at 09/01/2022 0748 Gross per 24 hour  Intake 829 ml  Output --  Net 829 ml         Physical Exam: Vital Signs Blood pressure (!) 161/89, pulse 73, temperature 98.6 F (37 C), temperature source Oral, resp. rate 18, height 5\' 2"  (1.575 m), weight 68.8 kg, last menstrual period 05/22/2018, SpO2 100 %.  General: No acute distress Mood and affect are appropriate Heart: Regular rate and rhythm no rubs murmurs or extra sounds Lungs: Clear to auscultation, breathing unlabored, no rales or wheezes Abdomen: Positive bowel sounds, soft nontender to palpation, nondistended Extremities: No clubbing, cyanosis, or edema Skin: No evidence of breakdown, no evidence of rash   MSK:      No apparent deformity.      Strength: Right upper and lower extremity 5 out of 5 throughout.  Left  4-/5 in upper, 4+/5 in lower.   Neurologic exam:  Cognition: AAO to person, place, time and event Language: Fluent, No substitutions or neoglisms.  Mild dysarthria. Names 3/3 objects correctly.  Memory: Recalls 3/3 objects at 5 minutes. No apparent deficits  Insight: Good  insight into current condition.  Mood: Pleasant affect, appropriate mood.  Sensation: To light touch intact in BL UEs and LEs  Reflexes: 2+ in BL UE and LEs.    Negative Babinski. CN: Positive mild right decreased nasolabial fold.  Otherwise, intact. Coordination: Left upper extremity ataxia on FTN  Spasticity: MAS 0 in all  extremities.          Assessment/Plan: 1. Functional deficits which require 3+ hours per day of interdisciplinary therapy in a comprehensive inpatient rehab setting. Physiatrist is providing close team supervision and 24 hour management of active medical problems listed below. Physiatrist and rehab team continue to assess barriers to discharge/monitor patient progress toward functional and medical goals  Care Tool:  Bathing    Body parts bathed by patient: Right arm, Left arm, Chest, Abdomen, Front perineal area, Buttocks, Right upper leg, Left upper leg, Face         Bathing assist Assist Level: Contact Guard/Touching assist     Upper Body Dressing/Undressing Upper body dressing   What is the patient wearing?: Pull over shirt    Upper body assist Assist Level: Set up assist    Lower Body Dressing/Undressing Lower body dressing      What is the patient wearing?: Pants, Underwear/pull up     Lower body assist Assist for lower body dressing: Contact Guard/Touching assist     Toileting Toileting Toileting Activity did not occur (Clothing management and hygiene only): N/A (no void or bm)  Toileting assist Assist for toileting: Contact Guard/Touching assist     Transfers Chair/bed transfer  Transfers assist  Chair/bed transfer assist level: Minimal Assistance - Patient > 75%     Locomotion Ambulation   Ambulation assist      Assist level: Contact Guard/Touching assist Assistive device: Walker-rolling Max distance: 150   Walk 10 feet activity   Assist  Walk 10 feet activity did not occur: Safety/medical concerns (2/2 dizziness)  Assist level: Contact Guard/Touching assist Assistive device: Walker-rolling   Walk 50 feet activity   Assist Walk 50 feet with 2 turns activity did not occur: Safety/medical concerns (2/2 dizziness)  Assist level: Contact Guard/Touching assist Assistive device: Walker-rolling    Walk 150 feet  activity   Assist Walk 150 feet activity did not occur: Safety/medical concerns (2/2 dizziness)  Assist level: Contact Guard/Touching assist Assistive device: Walker-rolling    Walk 10 feet on uneven surface  activity   Assist Walk 10 feet on uneven surfaces activity did not occur: Safety/medical concerns (2/2 dizziness)   Assist level: Contact Guard/Touching assist Assistive device: Walker-rolling   Wheelchair     Assist Is the patient using a wheelchair?: No             Wheelchair 50 feet with 2 turns activity    Assist            Wheelchair 150 feet activity     Assist          Blood pressure (!) 161/89, pulse 73, temperature 98.6 F (37 C), temperature source Oral, resp. rate 18, height 5\' 2"  (1.575 m), weight 68.8 kg, last menstrual period 05/22/2018, SpO2 100 %.   Medical Problem List and Plan: 1. Functional deficits secondary to stroke  She presented to [facility] [place] on 12/19/21 with left-sided weakness and visual deficits and MRI/MRA brain done revealing 1.9 cm acute ischemic nonhemorrhagic infarct in the deep white matter right frontal centrum ovale and advanced chronic microvascular disease.   New CVA 3/12 Left BG infarct               -patient may shower             -ELOS/Goals: 12-18 days, SPV PT/OT/SLP-               - Note, multiple prior history of stroke on both Brilinta, aspirin.  High risk for recurrence.Failed clopidigrel in past    2.  Antithrombotics: -DVT/anticoagulation:  Pharmaceutical: Lovenox             -antiplatelet therapy: Aspirin 81 mg daily and Brilinta 90 mg BID   3. Pain Management: Tylenol as needed   4. Mood/Behavior/Sleep: LCSW to evaluate and provide emotional support -generalized anxiety disorder             -depression: continue Cymbalta             -antipsychotic agents: n/a   5. Neuropsych/cognition: This patient is capable of making decisions on her own behalf.   6. Skin/Wound Care:  Routine skin care checks   7. Fluids/Electrolytes/Nutrition: Routine Is and Os and follow-up chemistries   8: Hypertension: monitor TID and prn   - Poorly controlled, still in range for permissive hypertension. add as needed hydralazine for systolic BPs greater than 180.     - 3/18: Will start amlodipine 2.5 mg daily (was on 5 mg at baseline)    09/01/2022    5:23 AM 08/31/2022    7:56 PM 08/31/2022    1:04 PM  Vitals with BMI  Systolic [id number]  Diastolic 89 86 75  Pulse  73 88 79   Adjust meds for   HTN increase amlodipine 5mg  qd on 3/21-monitor effect for 2-3 d, may need to increase dose    9: Hyperlipidemia: continue statin   10: OSA: CPAP mask test>>passed first phase.  Will need to coordinate outpatient equipment/follow-up   11: Dizziness/vertigo:improved after diazepam d/ced  12: Asthma, chronic: home regimen: albuterol inhaler as needed, Singulair (not restarted)   13: DM2-controlled: CBGs q AC and q HS; home regimen includes Ozempic q 10 days             -trend CBGS and decide on carb modified diet, SSI, etc.    - Stable on admission labs, monitor   14: CKD IV: Baseline creatinine ~2.5-3.0             -follow-up BMP   - Uptrending BUN, creatinine stable, encourage p.o. fluids and recheck stable   15: Anemia of chronic disease: hemoglobin stable; follow-up CBC - stable    LOS: 6 days A FACE TO FACE EVALUATION WAS PERFORMED  Christina Orozco 09/01/2022, 8:13 AM

## 2022-09-01 NOTE — Progress Notes (Signed)
Occupational Therapy Session Note  Patient Details  Name: Christina Orozco MRN: [id number] Date of Birth: December 14, 1970  Today's Date: 09/01/2022 OT Individual Time: [id number] OT Individual Time Calculation (min): 75 min    Short Term Goals: Week 1:  OT Short Term Goal 1 (Week 1): Patient will feed herself with Mod I OT Short Term Goal 2 (Week 1): Patient will dress upper body with set up assistance OT Short Term Goal 3 (Week 1): Patient will don/doff pants with close supervision OT Short Term Goal 4 (Week 1): Patient will don/doff socks/shoes with min assist OT Short Term Goal 5 (Week 1): Patient will trasnfer to [facility] with min assist  Skilled Therapeutic Interventions/Progress Updates:    Pt received in bed ready to shower. Pt was able to use RW to ambulate around her room and pick up items off the floor with close S.  She gathered clothing needed for the shower and sat on toilet to dress after shower.  She is now distant Supervision with all self care in the room.  Pt stated she was accustomed to using a rollater at home. Obtained one for pt to use. She ambulated over 300 ft with distant supervision to ADL apt.  Pt moving around kitchen briefly but spent time explaining that her daughter does all the cooking and pt has a small fridge and microwave she uses.  Pt explained ways that she modifies tasks so she does not have to get up and move that much.  Discussed with pt her young age and her good strength levels and to not modify so much, that getting up and down frequently is good for her mobility, strength, and overall wellness.  Pt agreed and stated she will try to be more active.  Pt ambulated back to room with all needs met.   Therapy Documentation Precautions:  Precautions Precautions: Fall Precaution Comments: dizziness with position changes Restrictions Weight Bearing Restrictions: No    Vital Signs: Therapy Vitals Temp: 98.6 F (37 C) Temp Source: Oral Pulse Rate: 73 Resp:  18 BP: (Abnormal) 161/89 Patient Position (if appropriate): Lying Oxygen Therapy O2 Device: Room Air Pain: Pain Assessment Pain Score: 0-No pain   Therapy/Group: Individual Therapy  [place] 09/01/2022, 9:10 AM

## 2022-09-01 NOTE — Progress Notes (Signed)
Physical Therapy Session Note  Patient Details  Name: Christina Orozco MRN: [id number] Date of Birth: 29-Jun-1970  Today's Date: 09/01/2022 PT Individual Time: [id number]; [id number] PT Individual Time Calculation (min): 43 min; 45 min  Short Term Goals: Week 1:  PT Short Term Goal 1 (Week 1): STG = LTG 2/2 LOS  Skilled Therapeutic Interventions/Progress Updates:   Session 1  Pt received sitting in WC and agreeable to PT services. Pt denied any dizziness or pain. Session focused on dynamic balance, wt shifts and dynamic gait.   Sit<>stand w/ Supervision A from [place]. Pt performed ~292ft of gait to dayroom gym using rollator and CGA. Therapist noted decreased instances of L scissoring gait during swing. Pt continues to require mod VC to increase L knee flexion to improve L foot clearance. Pt engaged in dynamic balance task w/ CGA/Min A to work on motor coordination/control and wt shifts. Pt instructed to step to the L and grab a cone, step back then step to the R to match cone to colored dot 2x6. Pt had an anterior LOB when stepping back from the R. Therapist there to provide Min assist for stability to prevent fall. Vc for wt shifts given for pt to control forward and backward stepping motions. Pt expressed that she did not feel confident in her balance and understands that she has to focus a lot more now when initiating movement. Engaged in navigating around cones in order to work on turns due to pt expressing that at times she feels like she's losing her balance when turning. Vc for pt to decrease speed when approaching cones to allow time for motor planning. Pt ambulated back to room ~238ft w/ CGA using Rollator. Pt ambulated into the bathroom. Continent of bladder and independent w/ dressing and pericare. Pt left seated in [facility] w/ chair alarm on, call bell in reach and all needs met.   Session 2 Pt reccieved sitting in WC and agreeable to PT services. Transfers throughout session were performed w/  close supervision. Gait ~251ftx2 to dayroom and back to room at the end of session. Pt ambulating w/ CGA and Rollator. Vc for pt to increase L knee flexion during swing to improve foot clearance. Pt demonstrated understanding of this, however, required consistent cueing.   Pt engaged in dynamic balance task w/ CGA/Min A by stepping on 5 colored dots arranged around her. Pt demonstrates decreased balance strategies on numerous occasions by exhibiting a posterior postural sway when stepping back. Pt unable to find COM w/o Min A from therapist. Consistent vc required for contralateral wt shifts when stepping to and motor planning before initiating movement. Pt verbalized understanding, however, struggled to demonstrate understanding consistently.   Pt returned to room and was left in [facility] w/ chair alarm on, call bell in reach and all needs met.  Therapy Documentation Precautions:  Precautions Precautions: Fall Precaution Comments: dizziness with position changes Restrictions Weight Bearing Restrictions: No General:    Pain: pt denied any pain for both sessions      Therapy/Group: Individual Therapy  Seyon Strader 09/01/2022, 11:50 AM

## 2022-09-01 NOTE — Plan of Care (Signed)
  Problem: RH Simple Meal Prep Goal: LTG Patient will perform simple meal prep w/assist (OT) Description: LTG: Patient will perform simple meal prep with assistance, with/without cues (OT). Flowsheets (Taken 09/01/2022 1317) LTG: Pt will perform simple meal prep with assistance level of: (LTG discontinued as pt states she does not do any cooking. Her daughter prepares her meals.) -- Note: LTG discontinued as pt states she does not do any cooking. Her daughter prepares her meals.

## 2022-09-02 DIAGNOSIS — I10 Essential (primary) hypertension: Secondary | ICD-10-CM

## 2022-09-02 DIAGNOSIS — I6302 Cerebral infarction due to thrombosis of basilar artery: Secondary | ICD-10-CM

## 2022-09-02 DIAGNOSIS — G4733 Obstructive sleep apnea (adult) (pediatric): Secondary | ICD-10-CM

## 2022-09-02 DIAGNOSIS — E1169 Type 2 diabetes mellitus with other specified complication: Secondary | ICD-10-CM

## 2022-09-02 DIAGNOSIS — N184 Chronic kidney disease, stage 4 (severe): Secondary | ICD-10-CM

## 2022-09-02 NOTE — Progress Notes (Signed)
Physical Therapy Session Note  Patient Details  Name: Christina Orozco MRN: [id number] Date of Birth: [birth year]  Today's Date: 09/02/2022 PT Individual Time: 0917-1016 PT Individual Time Calculation (min): 59 min   Short Term Goals: Week 1:  PT Short Term Goal 1 (Week 1): STG = LTG 2/2 LOS   Skilled Therapeutic Interventions/Progress Updates:    Pt received supine in bed and agreeable to PT services. Pt denies any pain this morning. Pt Mod I to sit at EOB using Bed rails to assist. Sit<>stand from EOB using Rollator. Pt ambulated into bathroom w. Closer supervision. Pt continent of bladder and independent w/ pericare. Pt performed UE and LE dressing in bathroom w/ SBA. Pt ambulated to sink and performed hand and oral hygiene at the standing at the sink w/ close supervision.   Pt performed ~113ft using rollator to dayroom w/ close supervision. Pt performed blocked practice STS 4x6 w/ eccentric descent. Emphasis on LLE wb, balance, and motor control. Airex pad placed under R foot for the first 3 sets to encourage increased LLE wb. Therapist challenged pt not to use any UE support. This worried pt, however, w/ encouragement pt's self-efficacy increased. Pt's COM tends to lean posteriorly resulting in her rocking on to heels when standing. Therapist cued pt to bring feet slightly back and shift wt anteriorly. Last set done w/o Airex pad to test carryover. Pt demonstrated an improved transfer by evenly distributing wt and COM.  Pt returned to room w/ rollator and close supervision. Pt left sitting in bed w/ bed alarm on, call bell in reach and all needs met.  Therapy Documentation Precautions:  Precautions Precautions: Fall Precaution Comments: dizziness with position changes Restrictions Weight Bearing Restrictions: No General:  Pain: pt denies any pain        Therapy/Group: Individual Therapy  [name] 09/02/2022, 12:16 PM

## 2022-09-02 NOTE — Progress Notes (Signed)
PROGRESS NOTE   Subjective/Complaints:  She says she is doing well overall. She says had issues with there CPAP not working correctly-related to water chamber.   ROS: Has LLE weakness, no double vision, no numbness, no abd discomfort or breathing issues, Denies CP  Objective:   No results found. No results for input(s): "WBC", "HGB", "HCT", "PLT" in the last 72 hours.  No results for input(s): "NA", "K", "CL", "CO2", "GLUCOSE", "BUN", "CREATININE", "CALCIUM" in the last 72 hours.   Intake/Output Summary (Last 24 hours) at 09/02/2022 1452 Last data filed at 09/02/2022 1248 Gross per 24 hour  Intake 480 ml  Output --  Net 480 ml         Physical Exam: Vital Signs Blood pressure (!) 159/96, pulse 91, temperature 98.2 F (36.8 C), temperature source Oral, resp. rate 16, height 5\' 2"  (1.575 m), weight 68.8 kg, last menstrual period 05/22/2018, SpO2 100 %.  General: No acute distress Mood and affect are appropriate Heart: Regular rate and rhythm no rubs murmurs or extra sounds Lungs: Clear to auscultation, breathing unlabored, no rales or wheezes Abdomen: Positive bowel sounds, soft nontender to palpation, nondistended Extremities: No clubbing, cyanosis, or edema Skin: No evidence of breakdown, no evidence of rash   MSK:      No apparent deformity.      Strength: Right upper and lower extremity 5 out of 5 throughout.  Left  4-/5 in upper, 4+/5 in lower. No abnormal tone noted Neuro: alert and awake, mild dysarthria    Prior Exam  Neurologic exam:  Cognition: AAO to person, place, time and event Language: Fluent, No substitutions or neoglisms.  Mild dysarthria. Names 3/3 objects correctly.  Memory: Recalls 3/3 objects at 5 minutes. No apparent deficits  Insight: Good  insight into current condition.  Mood: Pleasant affect, appropriate mood.  Sensation: To light touch intact in BL UEs and LEs  Reflexes: 2+ in BL  UE and LEs.    Negative Babinski. CN: Positive mild right decreased nasolabial fold.  Otherwise, intact. Coordination: Left upper extremity ataxia on FTN  Spasticity: MAS 0 in all extremities.          Assessment/Plan: 1. Functional deficits which require 3+ hours per day of interdisciplinary therapy in a comprehensive inpatient rehab setting. Physiatrist is providing close team supervision and 24 hour management of active medical problems listed below. Physiatrist and rehab team continue to assess barriers to discharge/monitor patient progress toward functional and medical goals  Care Tool:  Bathing    Body parts bathed by patient: Right arm, Left arm, Chest, Abdomen, Front perineal area, Buttocks, Right upper leg, Left upper leg, Face         Bathing assist Assist Level: Contact Guard/Touching assist     Upper Body Dressing/Undressing Upper body dressing   What is the patient wearing?: Pull over shirt    Upper body assist Assist Level: Set up assist    Lower Body Dressing/Undressing Lower body dressing      What is the patient wearing?: Pants, Underwear/pull up     Lower body assist Assist for lower body dressing: Contact Guard/Touching assist     Toileting Toileting Toileting Activity  did not occur Landscape architect and hygiene only): N/A (no void or bm)  Toileting assist Assist for toileting: Contact Guard/Touching assist     Transfers Chair/bed transfer  Transfers assist     Chair/bed transfer assist level: Supervision/Verbal cueing     Locomotion Ambulation   Ambulation assist      Assist level: Contact Guard/Touching assist Assistive device: Rollator Max distance: 150 ft   Walk 10 feet activity   Assist  Walk 10 feet activity did not occur: Safety/medical concerns (2/2 dizziness)  Assist level: Supervision/Verbal cueing Assistive device: Rollator   Walk 50 feet activity   Assist Walk 50 feet with 2 turns activity did not  occur: Safety/medical concerns (2/2 dizziness)  Assist level: Contact Guard/Touching assist Assistive device: Rollator    Walk 150 feet activity   Assist Walk 150 feet activity did not occur: Safety/medical concerns (2/2 dizziness)  Assist level: Contact Guard/Touching assist Assistive device: Rollator    Walk 10 feet on uneven surface  activity   Assist Walk 10 feet on uneven surfaces activity did not occur: Safety/medical concerns (2/2 dizziness)   Assist level: Contact Guard/Touching assist Assistive device: Walker-rolling   Wheelchair     Assist Is the patient using a wheelchair?: Yes Type of Wheelchair: Manual    Wheelchair assist level: Supervision/Verbal cueing Max wheelchair distance: 50 ft    Wheelchair 50 feet with 2 turns activity    Assist        Assist Level: Supervision/Verbal cueing   Wheelchair 150 feet activity     Assist          Blood pressure (!) 159/96, pulse 91, temperature 98.2 F (36.8 C), temperature source Oral, resp. rate 16, height 5\' 2"  (1.575 m), weight 68.8 kg, last menstrual period 05/22/2018, SpO2 100 %.   Medical Problem List and Plan: 1. Functional deficits secondary to stroke  She presented to [place] on 12/19/21 with left-sided weakness and visual deficits and MRI/MRA brain done revealing 1.9 cm acute ischemic nonhemorrhagic infarct in the deep white matter right frontal centrum ovale and advanced chronic microvascular disease.   New CVA 3/12 Left BG infarct               -patient may shower             -ELOS/Goals: 12-18 days, SPV PT/OT/SLP-               - Note, multiple prior history of stroke on both Brilinta, aspirin.  High risk for recurrence.Failed clopidigrel in past   -Continue CIR PT/OT/SLP   2.  Antithrombotics: -DVT/anticoagulation:  Pharmaceutical: Lovenox             -antiplatelet therapy: Aspirin 81 mg daily and Brilinta 90 mg BID   3. Pain Management: Tylenol as needed    4. Mood/Behavior/Sleep: LCSW to evaluate and provide emotional support -generalized anxiety disorder             -depression: continue Cymbalta             -antipsychotic agents: n/a   5. Neuropsych/cognition: This patient is capable of making decisions on her own behalf.   6. Skin/Wound Care: Routine skin care checks   7. Fluids/Electrolytes/Nutrition: Routine Is and Os and follow-up chemistries   8: Hypertension: monitor TID and prn   - Poorly controlled, still in range for permissive hypertension. add as needed hydralazine for systolic BPs greater than 180.     - 3/18: Will start amlodipine  2.5 mg daily (was on 5 mg at baseline)    09/02/2022    1:21 PM 09/02/2022    5:31 AM 09/01/2022    7:36 PM  Vitals with BMI  Systolic [id number]  Diastolic 96 84 82  Pulse 91 77 80   Adjust meds for   HTN increase amlodipine 5mg  qd on 3/21-monitor effect for 2-3 d, may need to increase dose  2/23 BP appears better controlled  with recent medication change continue to monitor  for now  9: Hyperlipidemia: continue statin   10: OSA: CPAP mask test>>passed first phase.  Will need to coordinate outpatient equipment/follow-up  -3/23 called respiratory therapy, they had looked at CPAP machine this morning and adjusted water chamber and thinks that this is most likely repaired.  Patient told to continue to monitor this tonight.  If continues to be an issue we can consider getting different CPAP for use in hospital   11: Dizziness/vertigo:improved after diazepam d/ced  12: Asthma, chronic: home regimen: albuterol inhaler as needed, Singulair (not restarted)   13: DM2-controlled: CBGs q AC and q HS; home regimen includes Ozempic q 10 days             -trend CBGS and decide on carb modified diet, SSI, etc.    - Stable on admission labs, monitor  -3/23 Monitor glucose on BMP monday  14: CKD IV: Baseline creatinine ~2.5-3.0             -follow-up BMP   - Uptrending BUN, creatinine stable,  encourage p.o. fluids and recheck stable   Recheck monday 15: Anemia of chronic disease: hemoglobin stable; follow-up CBC - stable      LOS: 7 days A FACE TO FACE EVALUATION WAS PERFORMED  Jennye Boroughs 09/02/2022, 2:52 PM

## 2022-09-03 DIAGNOSIS — D638 Anemia in other chronic diseases classified elsewhere: Secondary | ICD-10-CM

## 2022-09-03 NOTE — Progress Notes (Signed)
PROGRESS NOTE   Subjective/Complaints:  Reports no issues with CPAP last night. No new concerns this AM.  LBM 3/24  ROS: Has LLE weakness, no double vision, no numbness, no abd discomfort or breathing issues, Denies CP,SOB  Objective:   No results found. No results for input(s): "WBC", "HGB", "HCT", "PLT" in the last 72 hours.  No results for input(s): "NA", "K", "CL", "CO2", "GLUCOSE", "BUN", "CREATININE", "CALCIUM" in the last 72 hours.   Intake/Output Summary (Last 24 hours) at 09/03/2022 1437 Last data filed at 09/03/2022 0817 Gross per 24 hour  Intake 580 ml  Output --  Net 580 ml         Physical Exam: Vital Signs Blood pressure 139/75, pulse 78, temperature 98.1 F (36.7 C), temperature source Oral, resp. rate 16, height 5\' 2"  (1.575 m), weight 68.8 kg, last menstrual period 05/22/2018, SpO2 100 %.  General: No acute distress Mood and affect are appropriate, pleasant Heart: Regular rate and rhythm no rubs murmurs or extra sounds Lungs: Clear to auscultation, breathing unlabored, no rales or wheezes, good air movement Abdomen: Positive bowel sounds, soft nontender to palpation, nondistended Extremities: No clubbing, cyanosis, or edema Skin: No evidence of breakdown, no evidence of rash   MSK:      No apparent deformity.      Strength: Right upper and lower extremity 5 out of 5 throughout.  Left  4-/5 in upper, 4+/5 in lower. No abnormal tone noted Neuro: alert and awake, mild dysarthria    Prior Exam  Neurologic exam:  Cognition: AAO to person, place, time and event Language: Fluent, No substitutions or neoglisms.  Mild dysarthria. Names 3/3 objects correctly.  Memory: Recalls 3/3 objects at 5 minutes. No apparent deficits  Insight: Good  insight into current condition.  Mood: Pleasant affect, appropriate mood.  Sensation: To light touch intact in BL UEs and LEs  Reflexes: 2+ in BL UE and LEs.     Negative Babinski. CN: Positive mild right decreased nasolabial fold.  Otherwise, intact. Coordination: Left upper extremity ataxia on FTN  Spasticity: MAS 0 in all extremities.          Assessment/Plan: 1. Functional deficits which require 3+ hours per day of interdisciplinary therapy in a comprehensive inpatient rehab setting. Physiatrist is providing close team supervision and 24 hour management of active medical problems listed below. Physiatrist and rehab team continue to assess barriers to discharge/monitor patient progress toward functional and medical goals  Care Tool:  Bathing    Body parts bathed by patient: Right arm, Left arm, Chest, Abdomen, Front perineal area, Buttocks, Right upper leg, Left upper leg, Face         Bathing assist Assist Level: Contact Guard/Touching assist     Upper Body Dressing/Undressing Upper body dressing   What is the patient wearing?: Pull over shirt    Upper body assist Assist Level: Set up assist    Lower Body Dressing/Undressing Lower body dressing      What is the patient wearing?: Pants, Underwear/pull up     Lower body assist Assist for lower body dressing: Contact Guard/Touching assist     Toileting Toileting Toileting Activity did not occur (  Clothing management and hygiene only): N/A (no void or bm)  Toileting assist Assist for toileting: Contact Guard/Touching assist     Transfers Chair/bed transfer  Transfers assist     Chair/bed transfer assist level: Supervision/Verbal cueing     Locomotion Ambulation   Ambulation assist      Assist level: Contact Guard/Touching assist Assistive device: Rollator Max distance: 150 ft   Walk 10 feet activity   Assist  Walk 10 feet activity did not occur: Safety/medical concerns (2/2 dizziness)  Assist level: Supervision/Verbal cueing Assistive device: Rollator   Walk 50 feet activity   Assist Walk 50 feet with 2 turns activity did not occur:  Safety/medical concerns (2/2 dizziness)  Assist level: Contact Guard/Touching assist Assistive device: Rollator    Walk 150 feet activity   Assist Walk 150 feet activity did not occur: Safety/medical concerns (2/2 dizziness)  Assist level: Contact Guard/Touching assist Assistive device: Rollator    Walk 10 feet on uneven surface  activity   Assist Walk 10 feet on uneven surfaces activity did not occur: Safety/medical concerns (2/2 dizziness)   Assist level: Contact Guard/Touching assist Assistive device: Walker-rolling   Wheelchair     Assist Is the patient using a wheelchair?: Yes Type of Wheelchair: Manual    Wheelchair assist level: Supervision/Verbal cueing Max wheelchair distance: 50 ft    Wheelchair 50 feet with 2 turns activity    Assist        Assist Level: Supervision/Verbal cueing   Wheelchair 150 feet activity     Assist          Blood pressure 139/75, pulse 78, temperature 98.1 F (36.7 C), temperature source Oral, resp. rate 16, height 5\' 2"  (1.575 m), weight 68.8 kg, last menstrual period 05/22/2018, SpO2 100 %.   Medical Problem List and Plan: 1. Functional deficits secondary to stroke  She presented to [place] on 12/19/21 with left-sided weakness and visual deficits and MRI/MRA brain done revealing 1.9 cm acute ischemic nonhemorrhagic infarct in the deep white matter right frontal centrum ovale and advanced chronic microvascular disease.   New CVA 3/12 Left BG infarct               -patient may shower             -ELOS/Goals: 12-18 days, SPV PT/OT/SLP-               - Note, multiple prior history of stroke on both Brilinta, aspirin.  High risk for recurrence.Failed clopidigrel in past   -Continue CIR PT/OT/SLP  -Est discharge 3/26   2.  Antithrombotics: -DVT/anticoagulation:  Pharmaceutical: Lovenox             -antiplatelet therapy: Aspirin 81 mg daily and Brilinta 90 mg BID   3. Pain Management: Tylenol as  needed   4. Mood/Behavior/Sleep: LCSW to evaluate and provide emotional support -generalized anxiety disorder             -depression: continue Cymbalta             -antipsychotic agents: n/a   5. Neuropsych/cognition: This patient is capable of making decisions on her own behalf.   6. Skin/Wound Care: Routine skin care checks   7. Fluids/Electrolytes/Nutrition: Routine Is and Os and follow-up chemistries   8: Hypertension: monitor TID and prn   - Poorly controlled, still in range for permissive hypertension. add as needed hydralazine for systolic BPs greater than 180.     - 3/18: Will start amlodipine  2.5 mg daily (was on 5 mg at baseline)    09/03/2022    1:08 PM 09/03/2022    5:02 AM 09/02/2022    8:08 PM  Vitals with BMI  Systolic [id number]  Diastolic 75 89 71  Pulse 78 77 76   Adjust meds for   HTN increase amlodipine 5mg  qd on 3/21-monitor effect for 2-3 d, may need to increase dose  2/24 elevated BP this AM but overall appears to be better controlled, continue current regimen   9: Hyperlipidemia: continue statin   10: OSA: CPAP mask test>>passed first phase.  Will need to coordinate outpatient equipment/follow-up  -3/23 called respiratory therapy, they had looked at CPAP machine this morning and adjusted water chamber and thinks that this is most likely repaired.  Patient told to continue to monitor this tonight.  If continues to be an issue we can consider getting different CPAP for use in hospital  -3/24 reports CPAP worked with no issues last night, continue to monitor   11: Dizziness/vertigo:improved after diazepam d/ced  12: Asthma, chronic: home regimen: albuterol inhaler as needed, Singulair (not restarted).    13: DM2-controlled: CBGs q AC and q HS; home regimen includes Ozempic q 10 days             -trend CBGS and decide on carb modified diet, SSI, etc.    - Stable on admission labs, monitor  -3/23 Monitor glucose on BMP monday  14: CKD IV: Baseline  creatinine ~2.5-3.0             -follow-up BMP   - Uptrending BUN, creatinine stable, encourage p.o. fluids and recheck stable   Recheck tomorrow  15: Anemia of chronic disease: hemoglobin stable; follow-up CBC - stable  -Last HGB 9.9 3/16, recheck tomorrow      LOS: 8 days A FACE TO FACE EVALUATION WAS PERFORMED  Jennye Boroughs 09/03/2022, 2:37 PM

## 2022-09-04 ENCOUNTER — Inpatient Hospital Stay (HOSPITAL_COMMUNITY): Payer: 59

## 2022-09-04 LAB — PULMONARY FUNCTION TEST
FEF 25-75 Pre: 2.94 L/sec
FEF2575-%Pred-Pre: 109 %
FEV1-%Pred-Pre: 82 %
FEV1-Pre: 2.17 L
FEV1FVC-%Pred-Pre: 109 %
FEV6-%Pred-Pre: 75 %
FEV6-Pre: 2.45 L
FEV6FVC-%Pred-Pre: 102 %
FVC-%Pred-Pre: 73 %
FVC-Pre: 2.45 L
Pre FEV1/FVC ratio: 88 %
Pre FEV6/FVC Ratio: 100 %

## 2022-09-04 LAB — BASIC METABOLIC PANEL
Anion gap: 6 (ref 5–15)
BUN: 45 mg/dL — ABNORMAL HIGH (ref 6–20)
CO2: 23 mmol/L (ref 22–32)
Calcium: 9 mg/dL (ref 8.9–10.3)
Chloride: 112 mmol/L — ABNORMAL HIGH (ref 98–111)
Creatinine, Ser: 2.58 mg/dL — ABNORMAL HIGH (ref 0.44–1.00)
GFR, Estimated: 22 mL/min — ABNORMAL LOW (ref >60)
Glucose, Bld: 122 mg/dL — ABNORMAL HIGH (ref 70–99)
Potassium: 3.9 mmol/L (ref 3.5–5.1)
Sodium: 141 mmol/L (ref 135–145)

## 2022-09-04 LAB — CBC
HCT: 29.3 % — ABNORMAL LOW (ref 36.0–46.0)
Hemoglobin: 9.3 g/dL — ABNORMAL LOW (ref 12.0–15.0)
MCH: 28.8 pg (ref 26.0–34.0)
MCHC: 31.7 g/dL (ref 30.0–36.0)
MCV: 90.7 fL (ref 80.0–100.0)
Platelets: 214 10*3/uL (ref 150–400)
RBC: 3.23 MIL/uL — ABNORMAL LOW (ref 3.87–5.11)
RDW: 14.2 % (ref 11.5–15.5)
WBC: 6.1 10*3/uL (ref 4.0–10.5)
nRBC: 0 % (ref 0.0–0.2)

## 2022-09-04 LAB — BLOOD GAS, ARTERIAL
Acid-base deficit: 1.1 mmol/L (ref 0.0–2.0)
Bicarbonate: 22.4 mmol/L (ref 20.0–28.0)
O2 Saturation: 99.8 %
Patient temperature: 37
pCO2 arterial: 33 mmHg (ref 32–48)
pH, Arterial: 7.44 (ref 7.35–7.45)
pO2, Arterial: 113 mmHg — ABNORMAL HIGH (ref 83–108)

## 2022-09-04 MED ORDER — AMLODIPINE BESYLATE 10 MG PO TABS
10.0000 mg | ORAL_TABLET | Freq: Every day | ORAL | Status: DC
  Administered 2022-09-05: 10 mg via ORAL
  Filled 2022-09-04: qty 1

## 2022-09-04 MED ORDER — AMLODIPINE BESYLATE 5 MG PO TABS
5.0000 mg | ORAL_TABLET | Freq: Once | ORAL | Status: AC
  Administered 2022-09-04: 5 mg via ORAL
  Filled 2022-09-04: qty 1

## 2022-09-04 NOTE — Progress Notes (Signed)
PROGRESS NOTE   Subjective/Complaints:  Per RN, HH requiring PFT including ABG to order home CPAP   ROS: Has LLE weakness, no double vision, no numbness, no abd discomfort or breathing issues, Denies CP,SOB  Objective:   No results found. Recent Labs    09/04/22 0759  WBC 6.1  HGB 9.3*  HCT 29.3*  PLT 214    Recent Labs    09/04/22 0759  NA 141  K 3.9  CL 112*  CO2 23  GLUCOSE 122*  BUN 45*  CREATININE 2.58*  CALCIUM 9.0     Intake/Output Summary (Last 24 hours) at 09/04/2022 1022 Last data filed at 09/04/2022 0839 Gross per 24 hour  Intake 237 ml  Output --  Net 237 ml         Physical Exam: Vital Signs Blood pressure (!) 153/84, pulse 74, temperature 98.1 F (36.7 C), temperature source Oral, resp. rate 18, height 5\' 2"  (1.575 m), weight 68.8 kg, last menstrual period 05/22/2018, SpO2 99 %.  General: No acute distress Mood and affect are appropriate, pleasant Heart: Regular rate and rhythm no rubs murmurs or extra sounds Lungs: Clear to auscultation, breathing unlabored, no rales or wheezes, good air movement Abdomen: Positive bowel sounds, soft nontender to palpation, nondistended Extremities: No clubbing, cyanosis, or edema Skin: No evidence of breakdown, no evidence of rash   MSK:      No apparent deformity.      Strength: Right upper and lower extremity 5 out of 5 throughout.  No abnormal tone noted Neuro: alert and awake, mild dysarthria    Prior Exam  Neurologic exam:  Cognition: AAO to person, place, time and event Language: Fluent, No substitutions or neoglisms.   CN: Positive mild right decreased nasolabial fold.  Otherwise, intact. Coordination: Left upper extremity ataxia on FTN  Spasticity: MAS 0 in all extremities.          Assessment/Plan: 1. Functional deficits which require 3+ hours per day of interdisciplinary therapy in a comprehensive inpatient rehab  setting. Physiatrist is providing close team supervision and 24 hour management of active medical problems listed below. Physiatrist and rehab team continue to assess barriers to discharge/monitor patient progress toward functional and medical goals  Care Tool:  Bathing    Body parts bathed by patient: Right arm, Left arm, Chest, Abdomen, Front perineal area, Buttocks, Right upper leg, Left upper leg, Face         Bathing assist Assist Level: Contact Guard/Touching assist     Upper Body Dressing/Undressing Upper body dressing   What is the patient wearing?: Pull over shirt    Upper body assist Assist Level: Set up assist    Lower Body Dressing/Undressing Lower body dressing      What is the patient wearing?: Pants, Underwear/pull up     Lower body assist Assist for lower body dressing: Contact Guard/Touching assist     Toileting Toileting Toileting Activity did not occur (Clothing management and hygiene only): N/A (no void or bm)  Toileting assist Assist for toileting: Contact Guard/Touching assist     Transfers Chair/bed transfer  Transfers assist     Chair/bed transfer assist level: Supervision/Verbal cueing  Locomotion Ambulation   Ambulation assist      Assist level: Contact Guard/Touching assist Assistive device: Rollator Max distance: 150 ft   Walk 10 feet activity   Assist  Walk 10 feet activity did not occur: Safety/medical concerns (2/2 dizziness)  Assist level: Supervision/Verbal cueing Assistive device: Rollator   Walk 50 feet activity   Assist Walk 50 feet with 2 turns activity did not occur: Safety/medical concerns (2/2 dizziness)  Assist level: Contact Guard/Touching assist Assistive device: Rollator    Walk 150 feet activity   Assist Walk 150 feet activity did not occur: Safety/medical concerns (2/2 dizziness)  Assist level: Contact Guard/Touching assist Assistive device: Rollator    Walk 10 feet on uneven surface   activity   Assist Walk 10 feet on uneven surfaces activity did not occur: Safety/medical concerns (2/2 dizziness)   Assist level: Contact Guard/Touching assist Assistive device: Walker-rolling   Wheelchair     Assist Is the patient using a wheelchair?: Yes Type of Wheelchair: Manual    Wheelchair assist level: Supervision/Verbal cueing Max wheelchair distance: 50 ft    Wheelchair 50 feet with 2 turns activity    Assist        Assist Level: Supervision/Verbal cueing   Wheelchair 150 feet activity     Assist          Blood pressure (!) 153/84, pulse 74, temperature 98.1 F (36.7 C), temperature source Oral, resp. rate 18, height 5\' 2"  (1.575 m), weight 68.8 kg, last menstrual period 05/22/2018, SpO2 99 %.   Medical Problem List and Plan: 1. Functional deficits secondary to stroke  She presented to [place] [facility] on 12/19/21 with left-sided weakness and visual deficits and MRI/MRA brain done revealing 1.9 cm acute ischemic nonhemorrhagic infarct in the deep white matter right frontal centrum ovale and advanced chronic microvascular disease.   New CVA 3/12 Left BG infarct               -patient may shower             -ELOS/Goals: 12-18 days, SPV PT/OT/SLP-               - Note, multiple prior history of stroke on both Brilinta, aspirin.  High risk for recurrence.Failed clopidigrel in past   -Continue CIR PT/OT/SLP  -Est discharge 3/26   2.  Antithrombotics: -DVT/anticoagulation:  Pharmaceutical: Lovenox             -antiplatelet therapy: Aspirin 81 mg daily and Brilinta 90 mg BID   3. Pain Management: Tylenol as needed   4. Mood/Behavior/Sleep: LCSW to evaluate and provide emotional support -generalized anxiety disorder             -depression: continue Cymbalta             -antipsychotic agents: n/a   5. Neuropsych/cognition: This patient is capable of making decisions on her own behalf.   6. Skin/Wound Care: Routine skin care checks    7. Fluids/Electrolytes/Nutrition: Routine Is and Os and follow-up chemistries   8: Hypertension: monitor TID and prn   - Poorly controlled, still in range for permissive hypertension. add as needed hydralazine for systolic BPs greater than 180.     - 3/18: Will start amlodipine 2.5 mg daily (was on 5 mg at baseline)    09/04/2022    4:54 AM 09/03/2022    7:14 PM 09/03/2022    1:08 PM  Vitals with BMI  Systolic [id number]  Diastolic 84  74 75  Pulse 74 88 78   Adjust meds for   HTN increase amlodipine 10mg  qd on 3/25   9: Hyperlipidemia: continue statin   10: OSA: CPAP mask test>>passed first phase.  Will need to coordinate outpatient equipment/follow-up  -  -3/25  [facility] requesting PFT and ABG in order to supply CPAP for home use  11: Dizziness/vertigo:improved after diazepam d/ced  12: Asthma, chronic: home regimen: albuterol inhaler as needed, Singulair (not restarted).    13: DM2-controlled: CBGs q AC and q HS; home regimen includes Ozempic q 10 days             -trend CBGS and decide on carb modified diet, SSI, etc.    - Stable on admission labs, monitor    14: CKD IV: Baseline creatinine ~2.5-3.0             -follow-up BMP   - Uptrending BUN, creatinine stable, encourage p.o. fluids and recheck stable   Recheck tomorrow  15: Anemia of chronic disease: hemoglobin stable; follow-up CBC - stable      Latest Ref Rng & Units 09/04/2022    7:59 AM 08/26/2022    3:09 PM 08/22/2022    4:52 PM  CBC  WBC 4.0 - 10.5 K/uL 6.1  6.4    Hemoglobin 12.0 - 15.0 g/dL 9.3  9.9  9.9   Hematocrit 36.0 - 46.0 % 29.3  30.3  29.0   Platelets 150 - 400 K/uL 214  222       LOS: 9 days A FACE TO FACE EVALUATION WAS PERFORMED  Luanna Salk Yahsir Wickens 09/04/2022, 10:22 AM

## 2022-09-04 NOTE — Plan of Care (Signed)

## 2022-09-04 NOTE — Plan of Care (Cosign Needed)
  Problem: RH Balance Goal: LTG Patient will maintain dynamic standing balance (PT) Description: LTG:  Patient will maintain dynamic standing balance with assistance during mobility activities (PT) Outcome: Completed/Met   Problem: RH Bed Mobility Goal: LTG Patient will perform bed mobility with assist (PT) Description: LTG: Patient will perform bed mobility with assistance, with/without cues (PT). Outcome: Completed/Met   Problem: RH Bed to Chair Transfers Goal: LTG Patient will perform bed/chair transfers w/assist (PT) Description: LTG: Patient will perform bed to chair transfers with assistance (PT). Outcome: Completed/Met   Problem: RH Car Transfers Goal: LTG Patient will perform car transfers with assist (PT) Description: LTG: Patient will perform car transfers with assistance (PT). Outcome: Completed/Met   Problem: RH Ambulation Goal: LTG Patient will ambulate in home environment (PT) Description: LTG: Patient will ambulate in home environment, # of feet with assistance (PT). Outcome: Completed/Met

## 2022-09-04 NOTE — Progress Notes (Signed)
Patient ID: Christina Orozco, female   DOB: [birth year], 52 y.o.   MRN: [id number]  Patient requesting SW. Sw made attempt to see patient, not in room. Sw will FU.

## 2022-09-04 NOTE — Progress Notes (Signed)
Pt placed on HCPAP unit, per Home Settings. No distress noted. RT to follow.

## 2022-09-04 NOTE — Progress Notes (Addendum)
Physical Therapy Session Note  Patient Details  Name: Christina Orozco MRN: [id number] Date of Birth: 1970/09/15  Today's Date: 09/04/2022 PT Individual Time: 1030-1113 PT Individual Time Calculation (min): 43 min   Short Term Goals: Week 1:  PT Short Term Goals - Week 1 PT Short Term Goal 1 (Week 1): STG = LTG 2/2 LOS  Skilled Therapeutic Interventions/Progress Updates:    Pt received sitting at L EOB and agreeable to PT services. Pt excited about D/C tomorrow and has been made Mod I in room. Pt performed ~116ft of gait to dayroom w/ supervision. Pt required no vc for gait pattern. Demonstrating functional gait speed and pattern. Pt performed 5xSTS and TUG outcome measures:  5xSTS: 16.44s  TUG: Avg: 18.73s performed w/ rollator   Pt's sit<>stands have shown great improvement performing transfer Mod I using rollator and min cueing for set-up . Pt showed proper carryover by demonstrating a functional sit<>stand transfer w/ min vc to shift feet back to facilitate increased anterior wt shift.   Pt instructed to step over 3 obstacles while maintaining gait speed ~41ft, 51ft. Emphasis on dynamic balance and wt shifts. Pt performed w/ supervision/CGA. CGA given when pt pushed rollator out too far and therapist provided assist to prevent LOB. Pt ambulated back to room and sat in recliner chair. Pt left w/ all needs met.   Therapy Documentation Precautions:  Precautions Precautions: Fall Precaution Comments: dizziness with position changes Restrictions Weight Bearing Restrictions: No General:    Pain: pt denies any pain      Therapy/Group: Individual Therapy  Deangelo Berns 09/04/2022, 12:21 PM

## 2022-09-04 NOTE — Progress Notes (Signed)
Occupational Therapy Session Note  Patient Details  Name: Christina Orozco MRN: [id number] Date of Birth: 07/09/1970  Today's Date: 09/04/2022 OT Individual Time: 0930-1015 and 1303-1405 OT Individual Time Calculation (min): 45 min and  62 min    Short Term Goals: Week 1:  OT Short Term Goal 1 (Week 1): Patient will feed herself with Mod I OT Short Term Goal 2 (Week 1): Patient will dress upper body with set up assistance OT Short Term Goal 3 (Week 1): Patient will don/doff pants with close supervision OT Short Term Goal 4 (Week 1): Patient will don/doff socks/shoes with min assist OT Short Term Goal 5 (Week 1): Patient will trasnfer to [facility] with min assist  Skilled Therapeutic Interventions/Progress Updates:    Visit 1: pain: no c/o pain Pt eager to shower as she did not have the opportunity this weekend. Using her personal rollater, pt ambulated around room to gather her supplies, clothing and then walked to bathroom to toilet, shower and dress all independently using her rollater.  In the room, pt uses the tub bench, but at home she will get down into the tub to bathe as she prefers not to shower. Will practice that this afternoon.  Pt completed self care in a timely manner.  Pt asking if her daughter needed to come in for family ed today.  After talking with team, told pt she did not need to come in as pt is competent and at a mod I level.  Pt in room with all needs met. Mod Independent sign put up on her door as pt is now mod I in room.   Visit 2: Pain: no c/o pain  Pt received in gym and ready for therapy. Pt used rollator to ambulate from room to ortho gym (over 500 feet) without A.  Pt sat in gym to engage in reassessment of visual motor skills and coordination. Pt then ambulated to ADL apt to practice tub transfers. Pt sat to edge and then swung legs in and lowered hips into tub using grab bars.  To stand, she reaches forward to pull herself up and then sits back to edge of tub prior  to swinging legs out.  Pt tends to sit down fast and hard due to limited quadricep eccentric strength.  Had pt practice a second time, practicing slower descent. Recommended pt purchase adhesive tub strips for slip prevention. Also recommended that she have her daughter with her the first 3-4 x she does a tub transfer at home to ensure safety.  Had pt practice sit to stands using a 4ct cue to keep stand to sit controlled. Pt then ambulated back to her room with all needs met.    Therapy Documentation Precautions:  Precautions Precautions: Fall Precaution Comments: dizziness with position changes Restrictions Weight Bearing Restrictions: No    ADL: ADL Eating: Independent Where Assessed-Eating: Chair Grooming: Independent Where Assessed-Grooming: Standing at sink Upper Body Bathing: Independent Where Assessed-Upper Body Bathing: Shower Lower Body Bathing: Modified independent Where Assessed-Lower Body Bathing: Shower Upper Body Dressing: Independent Where Assessed-Upper Body Dressing: Chair Lower Body Dressing: Modified independent Where Assessed-Lower Body Dressing: Chair Toileting: Independent Where Assessed-Toileting: Glass blower/designer: Diplomatic Services operational officer Method: Counselling psychologist: Emergency planning/management officer Transfer: Modified independent Social research officer, government Method: Heritage manager: Grab bars, Transfer tub bench ADL Comments: has tub/shower at home    Therapy/Group: Individual Therapy  [place] 09/04/2022, 10:05 AM

## 2022-09-04 NOTE — Progress Notes (Addendum)
Physical Therapy Discharge Summary  Patient Details  Name: Christina Orozco MRN: [id number] Date of Birth: 1970-07-02  Date of Discharge from PT service:September 04, 2022  Today's Date: 09/04/2022 PT Individual Time: 1503-1600 PT Individual Time Calculation (min): 57 min    Patient has met 5 of 5 long term goals due to improved activity tolerance, improved balance, improved postural control, increased strength, ability to compensate for deficits, functional use of  left lower extremity, and improved coordination. Patient to discharge at an ambulatory level Supervision.   Patient's care partner is independent to provide the necessary physical assistance at discharge. Due to varying schedules Patients care partner's have not been present, however, pt has shown since, last week that she is near/at baseline of what family is familiar with.   Reasons goals not met: N/A  Recommendation:  Patient will benefit from ongoing skilled PT services in home health setting to continue to advance safe functional mobility, address ongoing impairments in balance, motor planning and coordination, endurance, activity tolerance,LE NMR, and minimize fall risk.  Equipment: No equipment provided  Reasons for discharge: treatment goals met and discharge from hospital  Patient/family agrees with progress made and goals achieved: Yes  Skilled Therapeutic Interventions Pt received sitting in recliner and agreeable to PT services. Pt's grad day assessment performed as detailed below. Pt has made great improvements throughout rehab stay. Pt is ambulating longer distance w/ supervision and is Mod I for shorter distances. Sit <> stand and stand<>pivot transfers are performed Mod I w/ pt using rollator. Pt no longer reports any dizziness impacting her mobility. Pt performed ~335ft of gait to ortho gym w/ rollator and supervision and performed car transfer. Pt cued to sit in car first then bring BLE in after. Pt demonstrated  understanding of this and require no further cueing. Pt declined to participate in stairs, due to not having any stairs she would have to navigate, and having an unfavorable experience in the past w/ sitars. Therapist respected pt's decision. Pt performed the BERG. Pt required assist to maintain stability and prevent LOB w/ the more higher level balance tasks. Pt scoring a 40/56' <44 is indicative of an individual being at a higher risk for falls. Nevertheless, pt has show functional understanding of balance strategies and understanding when to slow down and how to motor plan to prevent LOB and falls. Pt ambulated back to room w/ rollator and performed ~10 ft of gait w/ Mod I and returned to sitting in recliner w/ all needs met.   PT Discharge Precautions/Restrictions Precautions Precautions: Fall Precaution Comments: pt no longer having dizziness Restrictions Weight Bearing Restrictions: No Vital Signs Therapy Vitals Temp: 97.7 F (36.5 C) Pulse Rate: 76 Resp: 17 BP: (!) 150/69 Patient Position (if appropriate): Sitting Oxygen Therapy SpO2: 100 % O2 Device: Room Air Pain Pain Assessment Pain Scale: 0-10 Pain Score: 0-No pain Pain Interference Pain Interference Pain Effect on Sleep: 1. Rarely or not at all Pain Interference with Therapy Activities: 1. Rarely or not at all Pain Interference with Day-to-Day Activities: 1. Rarely or not at all Vision/Perception  Vision - History Ability to See in Adequate Light: 1 Impaired Vision - Assessment Eye Alignment: Within Functional Limits Ocular Range of Motion: Within Functional Limits Alignment/Gaze Preference: Within Defined Limits Tracking/Visual Pursuits: Able to track stimulus in all quads without difficulty (minimal eye bounce in end ranges in R visual field) Saccades: Within functional limits Convergence: Within functional limits Perception Perception: Within Functional Limits Praxis Praxis: Intact  Cognition Overall  Cognitive Status: Within Functional Limits for tasks assessed Arousal/Alertness: Awake/alert Orientation Level: Oriented X4 Sustained Attention: Appears intact Memory: Appears intact Awareness: Appears intact Problem Solving: Appears intact Safety/Judgment: Appears intact Sensation Sensation Light Touch: Appears Intact Hot/Cold: Appears Intact Proprioception: Appears Intact Stereognosis: Appears Intact Coordination Gross Motor Movements are Fluid and Coordinated: No Fine Motor Movements are Fluid and Coordinated: Yes Coordination and Movement Description: improved coordination - uses L side functionally Finger Nose Finger Test: only slight dysmetria on L side when reaching toward nose Motor  Motor Motor: Ataxia;Hemiplegia Motor - Discharge Observations: Minor L knee buckling during gait  Mobility Bed Mobility Bed Mobility: Supine to Sit;Sit to Supine;Scooting to [facility];Sitting - Scoot to Edge of Bed Supine to Sit: Independent with assistive device Sitting - Scoot to Edge of Bed: Independent with assistive device Sit to Supine: Independent with assistive device Scooting to [facility]: Independent with assistive device Transfers Transfers: Stand Pivot Transfers;Stand to Sit;Sit to Stand Sit to Stand: Independent with assistive device Stand to Sit: Independent with assistive device Stand Pivot Transfers: Independent with assistive device Transfer (Assistive device): Rollator Locomotion  Gait Ambulation: Yes Gait Assistance: Set up assist Gait Distance (Feet): 300 Feet Assistive device: Rollator Gait Assistance Details: Verbal cues for precautions/safety Gait Assistance Details: Cues to slow down around turns Gait Gait: Yes Gait Pattern: Left genu recurvatum Gait velocity: WFL Stairs / Additional Locomotion Stairs: No Ramp: Independent with assistive device Pick up small object from the floor assist level: Supervision/Verbal cueing Wheelchair Mobility Wheelchair Mobility: No   Trunk/Postural Assessment  Cervical Assessment Cervical Assessment: Within Functional Limits Thoracic Assessment Thoracic Assessment: Within Functional Limits Lumbar Assessment Lumbar Assessment: Within Functional Limits Postural Control Postural Control: Deficits on evaluation Trunk Control: postural sway when reaching out of base of support  Balance Balance Balance Assessed: Yes Standardized Balance Assessment Standardized Balance Assessment: Timed Up and Go Test;Berg Balance Test Berg Balance Test Sit to Stand: Able to stand without using hands and stabilize independently Standing Unsupported: Able to stand safely 2 minutes Sitting with Back Unsupported but Feet Supported on Floor or Stool: Able to sit safely and securely 2 minutes Stand to Sit: Sits safely with minimal use of hands Transfers: Able to transfer safely, definite need of hands Standing Unsupported with Eyes Closed: Able to stand 10 seconds with supervision Standing Ubsupported with Feet Together: Able to place feet together independently and stand for 1 minute with supervision From Standing, Reach Forward with Outstretched Arm: Can reach confidently >25 cm (10") From Standing Position, Pick up Object from Floor: Able to pick up shoe safely and easily From Standing Position, Turn to Look Behind Over each Shoulder: Needs supervision when turning Turn 360 Degrees: Needs close supervision or verbal cueing Standing Unsupported, Alternately Place Feet on Step/Stool: Able to complete >2 steps/needs minimal assist Standing Unsupported, One Foot in Front: Able to take small step independently and hold 30 seconds Standing on One Leg: Able to lift leg independently and hold equal to or more than 3 seconds Total Score: 40 Timed Up and Go Test TUG: Normal TUG Normal TUG (seconds): 18.73 Static Sitting Balance Static Sitting - Balance Support: Feet supported Static Sitting - Level of Assistance: 7: Independent Dynamic  Sitting Balance Dynamic Sitting - Balance Support: Bilateral upper extremity supported;Feet supported;During functional activity Dynamic Sitting - Level of Assistance: 7: Independent Static Standing Balance Static Standing - Balance Support: Bilateral upper extremity supported;During functional activity Static Standing - Level of Assistance: 7: Independent Dynamic Standing Balance Dynamic Standing - Balance  Support: During functional activity Dynamic Standing - Level of Assistance: 6: Modified independent (Device/Increase time) Dynamic Standing - Comments: uses 1 hand for support on rollator or grab bar or furniture when reaching to the floor Extremity Assessment  RUE Assessment RUE Assessment: Within Functional Limits LUE Assessment Active Range of Motion (AROM) Comments: 80* shoulder flexion - injury General Strength Comments: Has torn rotator cuff LUE - Does not want surgery Brunstrum level for arm: Stage V Relative Independence from [employer] Brunstrum level for hand: Stage VI Isolated joint movements RLE Assessment RLE Assessment: Exceptions to [facility] General Strength Comments: gross 5/5 LLE Assessment LLE Assessment: Exceptions to [facility] General Strength Comments: gross 4+/5   Bleu Minerd 09/04/2022, 4:43 PM

## 2022-09-04 NOTE — Progress Notes (Addendum)
Patient ID: Christina Orozco, female   DOB: 1970-12-17, 52 y.o.   MRN: [id number]  SW  received CPAP request today. Patient does not have current sleep study on charted. ABG and PFT requested tonight. Once test/study is complete SW will order CPAP through [employer]. Patient will not have CPAP at d/c due to late request. SW informed patient does now require family education, patient is MOD I. SW will discuss with patient.

## 2022-09-04 NOTE — Progress Notes (Signed)
Occupational Therapy Discharge Summary  Patient Details  Name: Christina Orozco MRN: [id number] Date of Birth: 1970-09-01  Date of Discharge from [facility] service:September 04, 2022  Patient has met 12 of 12 long term goals due to improved activity tolerance, improved balance, postural control, ability to compensate for deficits, functional use of  LEFT upper and LEFT lower extremity, improved attention, improved awareness, and improved coordination.   Patient to discharge at overall Modified Independent level.  Patient's care partner is independent to provide the necessary physical assistance at discharge.  Patient education only as daughter did not need to attend family education as pt is now mod I and cognitively competent.   Reasons goals not met: n/a  Recommendation:  Patient will benefit from ongoing skilled OT services in home health setting to continue to advance functional skills in the area of iADL.  Equipment: No equipment provided  Reasons for discharge: treatment goals met  Patient/family agrees with progress made and goals achieved: Yes  OT Discharge Precautions/Restrictions  Precautions Precautions: Fall Precaution Comments: pt no longer having dizziness Restrictions Weight Bearing Restrictions: No     ADL ADL Eating: Independent Where Assessed-Eating: Chair Grooming: Independent Where Assessed-Grooming: Standing at sink Upper Body Bathing: Independent Where Assessed-Upper Body Bathing: Shower Lower Body Bathing: Modified independent Where Assessed-Lower Body Bathing: Shower Upper Body Dressing: Independent Where Assessed-Upper Body Dressing: Chair Lower Body Dressing: Modified independent Where Assessed-Lower Body Dressing: Chair Toileting: Independent Where Assessed-Toileting: Glass blower/designer: Diplomatic Services operational officer Method: Counselling psychologist: Energy manager: Modified independent Clinical cytogeneticist  Method: Optometrist: Energy manager: Chief Financial Officer Method: Heritage manager: Grab bars, Transfer tub bench ADL Comments: has tub/shower at home Vision Baseline Vision/History: 4 Cataracts Patient Visual Report: Blurring of vision (R eye cataract) Vision Assessment?: Yes Eye Alignment: Within Functional Limits Ocular Range of Motion: Within Functional Limits Alignment/Gaze Preference: Within Defined Limits Tracking/Visual Pursuits: Able to track stimulus in all quads without difficulty (minimal eye bounce in end ranges in R visual field) Saccades: Within functional limits Convergence: Within functional limits Visual Fields: No apparent deficits Perception  Perception: Within Functional Limits Praxis Praxis: Intact Cognition Cognition Overall Cognitive Status: Within Functional Limits for tasks assessed Arousal/Alertness: Awake/alert Orientation Level: Person;Place;Situation Person: Oriented Place: Oriented Situation: Oriented Memory: Appears intact Sustained Attention: Appears intact Awareness: Appears intact Problem Solving: Appears intact Safety/Judgment: Appears intact Brief Interview for Mental Status (BIMS) Repetition of Three Words (First Attempt): 3 Temporal Orientation: Year: Correct Temporal Orientation: Month: Accurate within 5 days Temporal Orientation: Day: Correct Recall: "Sock": Yes, no cue required Recall: "Blue": Yes, no cue required Recall: "Bed": Yes, no cue required BIMS Summary Score: 15 Sensation Sensation Light Touch: Appears Intact Hot/Cold: Appears Intact Proprioception: Appears Intact Stereognosis: Appears Intact Coordination Gross Motor Movements are Fluid and Coordinated: No Fine Motor Movements are Fluid and Coordinated: Yes Coordination and Movement Description: improved coordination - uses L side functionally Finger Nose Finger Test: only slight  dysmetria on L side when reaching toward nose Motor  Motor Motor: Ataxia Motor - Discharge Observations: improved prescision with LUE, continues to demonstrate core strength weakness, hx of back pain - this limits her dynamic standing balance Mobility    Mod I with RW Trunk/Postural Assessment  Postural Control Postural Control: Deficits on evaluation Trunk Control: minimally impaired with reaching out of base of support, she relies heavily on 1 hand support when reaching with opposite hand  Balance Static Sitting Balance Static  Sitting - Level of Assistance: 7: Independent Dynamic Sitting Balance Dynamic Sitting - Level of Assistance: 7: Independent Static Standing Balance Static Standing - Level of Assistance: 7: Independent Dynamic Standing Balance Dynamic Standing - Level of Assistance: 6: Modified independent (Device/Increase time) Dynamic Standing - Comments: uses 1 hand for support on rollator or grab bar or furniture when reaching to the floor Extremity/Trunk Assessment RUE Assessment RUE Assessment: Within Functional Limits LUE Assessment Active Range of Motion (AROM) Comments: 80* shoulder flexion - injury General Strength Comments: Has torn rotator cuff LUE - Does not want surgery Brunstrum level for arm: Stage V Relative Independence from Synergy Brunstrum level for hand: Stage VI Isolated joint movements   Christina Orozco 09/04/2022, 3:14 PM

## 2022-09-04 NOTE — Progress Notes (Signed)
Patient ID: Christina Orozco, female   DOB: 08-Jun-1971, 52 y.o.   MRN: [id number]  Patient requesting letter from physician for her daughter to provide to her employer due to missing work today. No additional questions or concerns.

## 2022-09-04 NOTE — Progress Notes (Signed)
Patient ID: Christina Orozco, female   DOB: 20-Nov-1970, 52 y.o.   MRN: [id number]  Letter for pt's daughter provided in room.

## 2022-09-04 NOTE — Progress Notes (Signed)
Inpatient Rehabilitation Discharge Medication Review by a Pharmacist  A complete drug regimen review was completed for this patient to identify any potential clinically significant medication issues.  High Risk Drug Classes Is patient taking? Indication by Medication  Antipsychotic No   Anticoagulant No   Antibiotic No   Opioid No   Antiplatelet Yes Brilinita, aspirin - CVA  Hypoglycemics/insulin Yes Ozempic - DM  Vasoactive Medication Yes Amlodipine - BP  Chemotherapy No   Other Yes Albuterol inhaler - prn SOB Atorvastatin - HLD Montelukast - COPD Pantoprazole - Reflux     Type of Medication Issue Identified Description of Issue Recommendation(s)  Drug Interaction(s) (clinically significant)     Duplicate Therapy     Allergy     No Medication Administration End Date     Incorrect Dose     Additional Drug Therapy Needed     Significant med changes from prior encounter (inform family/care partners about these prior to discharge).    Other       Clinically significant medication issues were identified that warrant physician communication and completion of prescribed/recommended actions by midnight of the next day:  No  Pharmacist comments: none  Time spent performing this drug regimen review (minutes):  20 minutes  Thank you Anette Guarneri, PharmD 09/04/2022 2:25 PM

## 2022-09-05 ENCOUNTER — Other Ambulatory Visit (HOSPITAL_COMMUNITY): Payer: Self-pay

## 2022-09-05 ENCOUNTER — Ambulatory Visit (HOSPITAL_BASED_OUTPATIENT_CLINIC_OR_DEPARTMENT_OTHER): Payer: 59 | Admitting: Family

## 2022-09-05 ENCOUNTER — Encounter (HOSPITAL_COMMUNITY): Payer: Self-pay

## 2022-09-05 MED ORDER — ASPIRIN 81 MG PO TBEC
81.0000 mg | DELAYED_RELEASE_TABLET | Freq: Every day | ORAL | 0 refills | Status: DC
  Filled 2022-09-05: qty 30, 30d supply, fill #0

## 2022-09-05 MED ORDER — AMLODIPINE BESYLATE 10 MG PO TABS
10.0000 mg | ORAL_TABLET | Freq: Every day | ORAL | 0 refills | Status: DC
  Filled 2022-09-05: qty 30, 30d supply, fill #0

## 2022-09-05 MED ORDER — ATORVASTATIN CALCIUM 80 MG PO TABS
80.0000 mg | ORAL_TABLET | Freq: Every day | ORAL | 0 refills | Status: DC
  Filled 2022-09-05: qty 30, 30d supply, fill #0

## 2022-09-05 MED ORDER — TICAGRELOR 90 MG PO TABS
90.0000 mg | ORAL_TABLET | Freq: Two times a day (BID) | ORAL | 0 refills | Status: AC
  Filled 2022-09-05: qty 32, 16d supply, fill #0

## 2022-09-05 NOTE — Progress Notes (Addendum)
Patient ID: Christina Orozco, female   DOB: 1970-12-04, 52 y.o.   MRN: [id number]  Sw informed patient and daughter received the CPAP from sleep study on 3/15. Patient will have CPAP at d/c. No additional questions or concerns for SW.

## 2022-09-05 NOTE — Progress Notes (Signed)
Patient ID: Christina Orozco, female   DOB: December 08, 1970, 52 y.o.   MRN: [id number]  SW met with patient to confirm daughter received letter on yesterday. Daughter received letter. Patient reports her transportation for d/c is about 15 minutes away and brother does not want to come upstairs. PA and nurse informed. No additional questions or concerns for d/c.

## 2022-09-05 NOTE — Progress Notes (Signed)
PROGRESS NOTE   Subjective/Complaints:  Reviewed results of PFT and ABG testing, normal Required brachial stick for ABG testing right upper extremity.  Feels a little sore at the elbow.  ROS: Has LLE weakness, no double vision, no numbness, no abd discomfort or breathing issues, Denies CP,SOB  Objective:   No results found. Recent Labs    09/04/22 0759  WBC 6.1  HGB 9.3*  HCT 29.3*  PLT 214    Recent Labs    09/04/22 0759  NA 141  K 3.9  CL 112*  CO2 23  GLUCOSE 122*  BUN 45*  CREATININE 2.58*  CALCIUM 9.0     Intake/Output Summary (Last 24 hours) at 09/05/2022 0919 Last data filed at 09/04/2022 1842 Gross per 24 hour  Intake 823 ml  Output --  Net 823 ml         Physical Exam: Vital Signs Blood pressure (!) 175/86, pulse 79, temperature 98.1 F (36.7 C), temperature source Oral, resp. rate 17, height 5\' 2"  (1.575 m), weight 68.8 kg, last menstrual period 05/22/2018, SpO2 100 %.  General: No acute distress Mood and affect are appropriate, pleasant Heart: Regular rate and rhythm no rubs murmurs or extra sounds Lungs: Clear to auscultation, breathing unlabored, no rales or wheezes, good air movement Abdomen: Positive bowel sounds, soft nontender to palpation, nondistended Extremities: No clubbing, cyanosis, or edema Skin: No evidence of breakdown, no evidence of rash   MSK:      No apparent deformity.      Strength: Right upper and lower extremity 5 out of 5 throughout.  No abnormal tone noted Neuro: alert and awake, mild dysarthria    Prior Exam  Neurologic exam:  Cognition: AAO to person, place, time and event Language: Fluent, No substitutions or neoglisms.   CN: Positive mild right decreased nasolabial fold.  Otherwise, intact. Coordination: Left upper extremity ataxia on FTN  Spasticity: MAS 0 in all extremities.          Assessment/Plan: 1. 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 Stable for D/C today F/u PCP in 3-4 weeks F/u PM&R 2 weeks See D/C summary See D/C instructions  Care Tool:  Bathing    Body parts bathed by patient: Right arm, Left arm, Chest, Abdomen, Front perineal area, Buttocks, Right upper leg, Left upper leg, Face, Right lower leg, Left lower leg         Bathing assist Assist Level: Independent with assistive device     Upper Body Dressing/Undressing Upper body dressing   What is the patient wearing?: Pull over shirt, Bra    Upper body assist Assist Level: Independent    Lower Body Dressing/Undressing Lower body dressing      What is the patient wearing?: Pants, Underwear/pull up     Lower body assist Assist for lower body dressing: Independent with assitive device     Toileting Toileting Toileting Activity did not occur (Clothing management and hygiene only): N/A (no void or bm)  Toileting assist Assist for toileting: Independent     Transfers Chair/bed transfer  Transfers assist     Chair/bed transfer assist level: Independent with assistive device  Locomotion Ambulation   Ambulation assist      Assist level: Set up assist Assistive device: Rollator Max distance: 312ft   Walk 10 feet activity   Assist  Walk 10 feet activity did not occur: Safety/medical concerns (2/2 dizziness)  Assist level: Set up assist Assistive device: Rollator   Walk 50 feet activity   Assist Walk 50 feet with 2 turns activity did not occur: Safety/medical concerns (2/2 dizziness)  Assist level: Set up assist Assistive device: Rollator    Walk 150 feet activity   Assist Walk 150 feet activity did not occur: Safety/medical concerns (2/2 dizziness)  Assist level: Set up assist Assistive device: Rollator    Walk 10 feet on uneven surface  activity   Assist Walk 10 feet on uneven surfaces activity did not occur: Safety/medical concerns (2/2 dizziness)   Assist level: Set up assist Assistive  device: Rollator   Wheelchair     Assist Is the patient using a wheelchair?: No Type of Wheelchair: Manual    Wheelchair assist level: Supervision/Verbal cueing Max wheelchair distance: 50 ft    Wheelchair 50 feet with 2 turns activity    Assist        Assist Level: Supervision/Verbal cueing   Wheelchair 150 feet activity     Assist          Blood pressure (!) 175/86, pulse 79, temperature 98.1 F (36.7 C), temperature source Oral, resp. rate 17, height 5\' 2"  (1.575 m), weight 68.8 kg, last menstrual period 05/22/2018, SpO2 100 %.   Medical Problem List and Plan: 1. Functional deficits secondary to stroke   New CVA 3/12 Left BG infarct               -Ready for discharge today   2.  Antithrombotics: -DVT/anticoagulation:  Pharmaceutical: Lovenox             -antiplatelet therapy: Aspirin 81 mg daily and Brilinta 90 mg BID   3. Pain Management: Tylenol as needed   4. Mood/Behavior/Sleep: LCSW to evaluate and provide emotional support -generalized anxiety disorder             -depression: continue Cymbalta             -antipsychotic agents: n/a   5. Neuropsych/cognition: This patient is capable of making decisions on her own behalf.   6. Skin/Wound Care: Routine skin care checks   7. Fluids/Electrolytes/Nutrition: Routine Is and Os and follow-up chemistries   8: Hypertension: monitor TID and prn   - Poorly controlled, still in range for permissive hypertension. add as needed hydralazine for systolic BPs greater than 180.     - 3/18: Will start amlodipine 2.5 mg daily (was on 5 mg at baseline)    09/05/2022    4:49 AM 09/04/2022   10:00 PM 09/04/2022    8:13 PM  Vitals with BMI  Systolic 0000000  [id number]  Diastolic 86  88  Pulse 79 83 84   Adjust meds for   HTN increase amlodipine 10mg  qd on 3/25   9: Hyperlipidemia: continue statin   10: OSA: CPAP mask test>>passed first phase.  Will need to coordinate outpatient equipment/follow-up  -  -3/25   [facility] requesting PFT and ABG in order to supply CPAP for home use  11: Dizziness/vertigo:improved after diazepam d/ced  12: Asthma, chronic: home regimen: albuterol inhaler as needed, Singulair (not restarted).    13: DM2-controlled: CBGs q AC and q HS; home regimen includes Ozempic q 10 days             -  trend CBGS and decide on carb modified diet, SSI, etc.    - Stable on admission labs, monitor    14: CKD IV: Baseline creatinine ~2.5-3.0             -follow-up BMP   - Uptrending BUN, creatinine stable, encourage p.o. fluids and recheck stable   Recheck tomorrow  15: Anemia of chronic disease: hemoglobin stable; follow-up CBC - stable      Latest Ref Rng & Units 09/04/2022    7:59 AM 08/26/2022    3:09 PM 08/22/2022    4:52 PM  CBC  WBC 4.0 - 10.5 K/uL 6.1  6.4    Hemoglobin 12.0 - 15.0 g/dL 9.3  9.9  9.9   Hematocrit 36.0 - 46.0 % 29.3  30.3  29.0   Platelets 150 - 400 K/uL 214  222       LOS: 10 days A FACE TO FACE EVALUATION WAS PERFORMED  Charlett Blake 09/05/2022, 9:19 AM

## 2022-09-05 NOTE — Progress Notes (Signed)
Patient ID: Christina Orozco, female   DOB: [birth year], 52 y.o.   MRN: [id number]  Patient will not d/c home with CPAP due to documentation required to support not available. PFT and ABG reviewed by physician and deemed "normal". SW and PA recommend patient to FU with neurologists in reference to smart sleep study.

## 2022-09-05 NOTE — Progress Notes (Signed)
Discharge instructions provided by Vaughan Basta, PA.  Medications provided by [facility] pharmacy. Patient safely assisted off the unit and discharged via private car with brother.    Yehuda Mao, LPN

## 2022-09-05 NOTE — Progress Notes (Signed)
Inpatient Rehabilitation Care Coordinator Discharge Note   Patient Details  Name: Christina Orozco MRN: [id number] Date of Birth: [birth year]   Discharge location: Home  Length of Stay: 10 Days  Discharge activity level: MOD I/SUP  Home/community participation: Daughter and Brother  Patient response [id number] Literacy - How often do you need to have someone help you when you read instructions, pamphlets, or other written material from your doctor or pharmacy?: Sometimes  Patient response [id number] Isolation - How often do you feel lonely or isolated from those around you?: Never  Services provided included: SW, Neuropsych, Pharmacy, TR, CM, RN, SLP, PT, OT, RD, MD  Financial Services:  Financial Services Utilized: [place] offered to/list presented to: Patient  Follow-up services arranged:  [place], DME Home Health Agency: [place] PT OT SLP    DME : N/A    Patient response to transportation need: Is the patient able to respond to transportation needs?: Yes In the past 12 months, has lack of transportation kept you from medical appointments or from getting medications?: No In the past 12 months, has lack of transportation kept you from meetings, work, or from getting things needed for daily living?: No    Comments (or additional information):  Patient/Family verbalized understanding of follow-up arrangements:  Yes  Individual responsible for coordination of the follow-up plan: self or daughter  Confirmed correct DME delivered: [name] 09/05/2022    [name]

## 2022-09-05 NOTE — Progress Notes (Signed)
Patient asking lots of questions about the CPAP machine/mask, etc for home. Pam Love PAC reviewed CPAP in room for discharge, reviewed CPAP care and maintenance with the patient. She is [facility]Dr. Sethi's office for the sleep study. Has a contact number for [facility] with the Sleep Study program to call for additional information/questions.

## 2022-09-06 ENCOUNTER — Encounter (HOSPITAL_COMMUNITY): Payer: Self-pay

## 2022-09-06 NOTE — Progress Notes (Signed)
SLEEP SMART STROKE STUDY NOTE  Patient is participating in the sleep smart stroke prevention study for sleep apnea. She has been randomized to the CPAP treatment and will be discharged with CPAP mask and machine provided by the study. She will follow-up in the [facility] research clinic as per study protocol.   Antony Contras, MD

## 2022-09-14 ENCOUNTER — Encounter (HOSPITAL_COMMUNITY): Payer: Self-pay

## 2022-09-18 ENCOUNTER — Other Ambulatory Visit (HOSPITAL_BASED_OUTPATIENT_CLINIC_OR_DEPARTMENT_OTHER): Payer: Self-pay | Admitting: Cardiovascular Disease

## 2022-09-20 ENCOUNTER — Emergency Department (HOSPITAL_BASED_OUTPATIENT_CLINIC_OR_DEPARTMENT_OTHER)
Admission: EM | Admit: 2022-09-20 | Discharge: 2022-09-20 | Disposition: A | Discharge status: Home / Self Care | Payer: 59 | Attending: Emergency Medicine | Admitting: Emergency Medicine

## 2022-09-20 ENCOUNTER — Encounter (HOSPITAL_BASED_OUTPATIENT_CLINIC_OR_DEPARTMENT_OTHER): Payer: Self-pay | Admitting: Emergency Medicine

## 2022-09-20 ENCOUNTER — Encounter (HOSPITAL_COMMUNITY): Payer: Self-pay

## 2022-09-20 ENCOUNTER — Other Ambulatory Visit: Payer: Self-pay

## 2022-09-20 DIAGNOSIS — N189 Chronic kidney disease, unspecified: Secondary | ICD-10-CM | POA: Diagnosis not present

## 2022-09-20 DIAGNOSIS — Z79899 Other long term (current) drug therapy: Secondary | ICD-10-CM | POA: Diagnosis not present

## 2022-09-20 DIAGNOSIS — Z7982 Long term (current) use of aspirin: Secondary | ICD-10-CM | POA: Diagnosis not present

## 2022-09-20 DIAGNOSIS — D649 Anemia, unspecified: Secondary | ICD-10-CM | POA: Insufficient documentation

## 2022-09-20 DIAGNOSIS — I129 Hypertensive chronic kidney disease with stage 1 through stage 4 chronic kidney disease, or unspecified chronic kidney disease: Secondary | ICD-10-CM | POA: Insufficient documentation

## 2022-09-20 DIAGNOSIS — I10 Essential (primary) hypertension: Secondary | ICD-10-CM

## 2022-09-20 LAB — TROPONIN I (HIGH SENSITIVITY)
Troponin I (High Sensitivity): 9 ng/L (ref <18)
Troponin I (High Sensitivity): 9 ng/L (ref <18)

## 2022-09-20 LAB — BASIC METABOLIC PANEL
Anion gap: 12 (ref 5–15)
BUN: 40 mg/dL — ABNORMAL HIGH (ref 6–20)
CO2: 20 mmol/L — ABNORMAL LOW (ref 22–32)
Calcium: 9.8 mg/dL (ref 8.9–10.3)
Chloride: 110 mmol/L (ref 98–111)
Creatinine, Ser: 2.74 mg/dL — ABNORMAL HIGH (ref 0.44–1.00)
GFR, Estimated: 21 mL/min — ABNORMAL LOW (ref >60)
Glucose, Bld: 91 mg/dL (ref 70–99)
Potassium: 3.7 mmol/L (ref 3.5–5.1)
Sodium: 142 mmol/L (ref 135–145)

## 2022-09-20 LAB — CBC
HCT: 31.4 % — ABNORMAL LOW (ref 36.0–46.0)
Hemoglobin: 10.3 g/dL — ABNORMAL LOW (ref 12.0–15.0)
MCH: 28.9 pg (ref 26.0–34.0)
MCHC: 32.8 g/dL (ref 30.0–36.0)
MCV: 88.2 fL (ref 80.0–100.0)
Platelets: 252 10*3/uL (ref 150–400)
RBC: 3.56 MIL/uL — ABNORMAL LOW (ref 3.87–5.11)
RDW: 14.4 % (ref 11.5–15.5)
WBC: 4.7 10*3/uL (ref 4.0–10.5)
nRBC: 0 % (ref 0.0–0.2)

## 2022-09-20 MED ORDER — AMLODIPINE BESYLATE 10 MG PO TABS: 10.0000 mg | ORAL_TABLET | Freq: Every day | ORAL | 0 refills | Status: DC

## 2022-09-20 MED ORDER — AMLODIPINE BESYLATE 5 MG PO TABS
10.0000 mg | ORAL_TABLET | Freq: Once | ORAL | Status: AC
  Administered 2022-09-20: 10 mg via ORAL
  Filled 2022-09-20: qty 2

## 2022-09-20 NOTE — ED Provider Notes (Signed)
[facility] AT [facility]  Provider Note  CSN: [id number] Arrival date & time: 09/20/22 7622  History Chief Complaint  Patient presents with   Hypertension    Christina Orozco is a 52 y.o. female with history of HTN, CKD and recent admission for stroke. She has had continued elevated BP at home in recent weeks, but higher than typical with physical therapy today. She was advised by her PCP to come to the ED although she is asymptomatic. No headache, blurry vision, dizziness, weakness or numbness. No CP or SOB.    Home Medications Prior to Admission medications   Medication Sig Start Date End Date Taking? Authorizing Provider  albuterol (PROAIR HFA) 108 (90 Base) MCG/ACT inhaler INHALE 1 OR 2 PUFFS INTO THE LUNGS EVERY 6 HOURS AS NEEDED FOR WHEEZING OR SHORTNESS OF BREATH Patient taking differently: Inhale 2 puffs into the lungs as needed for shortness of breath or wheezing. 04/12/18   Angiulli, Mcarthur Rossetti, PA-C  amLODipine (NORVASC) 10 MG tablet Take 1 tablet (10 mg total) by mouth daily. 09/20/22   Pollyann Savoy, MD  aspirin EC 81 MG tablet Take 1 tablet (81 mg total) by mouth daily. 09/05/22   Setzer, Lynnell Jude, PA-C  atorvastatin (LIPITOR) 80 MG tablet Take 1 tablet (80 mg total) by mouth at bedtime. 09/05/22   Setzer, Lynnell Jude, PA-C  glucose blood (CONTOUR NEXT TEST) test strip 1 each by Other route 2 (two) times daily. And lancets 2/day Patient taking differently: 1 each by Other route 2 (two) times daily. 04/12/18   Angiulli, Mcarthur Rossetti, PA-C  montelukast (SINGULAIR) 10 MG tablet Take 10 mg by mouth at bedtime.    [provider]  OZEMPIC, 0.25 OR 0.5 MG/DOSE, 2 MG/3ML SOPN Inject 0.5 mg into the skin See admin instructions. Inject 0.5 mg subcutaneous every 10 days 04/14/22   [provider]  pantoprazole (PROTONIX) 40 MG tablet Take 1 tablet (40 mg total) by mouth daily. 12/30/21   Love, Evlyn Kanner, PA-C  ticagrelor (BRILINTA) 90 MG TABS  tablet Take 1 tablet (90 mg total) by mouth in the morning and at bedtime for 16 days. 09/05/22 09/21/22  Setzer, Lynnell Jude, PA-C     Allergies    Lisinopril, Penicillins, Quetiapine, and Gabapentin   Review of Systems   Review of Systems Please see HPI for pertinent positives and negatives  Physical Exam BP (!) 170/86   Pulse 79   Temp 98 F (36.7 C)   Resp 17   LMP 05/22/2018 (Within Days)   SpO2 100%   Physical Exam Vitals and nursing note reviewed.  Constitutional:      Appearance: Normal appearance.  HENT:     Head: Normocephalic and atraumatic.     Nose: Nose normal.     Mouth/Throat:     Mouth: Mucous membranes are moist.  Eyes:     Extraocular Movements: Extraocular movements intact.     Conjunctiva/sclera: Conjunctivae normal.  Cardiovascular:     Rate and Rhythm: Normal rate.  Pulmonary:     Effort: Pulmonary effort is normal.     Breath sounds: Normal breath sounds.  Abdominal:     General: Abdomen is flat.     Palpations: Abdomen is soft.     Tenderness: There is no abdominal tenderness.  Musculoskeletal:        General: No swelling. Normal range of motion.     Cervical back: Neck supple.  Skin:    General: Skin is  warm and dry.  Neurological:     General: No focal deficit present.     Mental Status: She is alert and oriented to person, place, and time.     Cranial Nerves: No cranial nerve deficit.     Sensory: No sensory deficit.     Motor: No weakness.  Psychiatric:        Mood and Affect: Mood normal.     ED Results / Procedures / Treatments   EKG EKG Interpretation  Date/Time:  Wednesday September 20 2022 20:06:52 EDT Ventricular Rate:  82 PR Interval:  132 QRS Duration: 80 QT Interval:  382 QTC Calculation: 446 R Axis:   57 Text Interpretation: Normal sinus rhythm Anterior infarct , age undetermined Abnormal ECG When compared with ECG of 22-Aug-2022 17:16, PREVIOUS ECG IS PRESENT No significant change since last tracing Confirmed by  [name] [phone]) on 09/20/2022 11:08:02 PM  Procedures Procedures  Medications Ordered in the ED Medications  amLODipine (NORVASC) tablet 10 mg (has no administration in time range)    Initial Impression and Plan  Patient here with asymptomatic HTN. Her current HTN medication regimen is Amlodipine 5mg  daily and Carvedilol 12.5mg  BID. Labs done in triage shows BMP with CKD at baseline, CBC with mild anemia. Trop is neg x 2. No evidence of end organ damage. Recommend she increase her Amlodipine to 10mg  daily (dose given here) and discuss long term BP management with PCP and/or Cardiology. RTED if she develops any concerning symptoms.   ED Course       MDM Rules/Calculators/A&P Medical Decision Making Problems Addressed: Uncontrolled hypertension: acute illness or injury  Amount and/or Complexity of Data Reviewed Labs: ordered. Decision-making details documented in ED Course.  Risk Prescription drug management.     Final Clinical Impression(s) / ED Diagnoses Final diagnoses:  Uncontrolled hypertension    Rx / DC Orders ED Discharge Orders          Ordered    amLODipine (NORVASC) 10 MG tablet  Daily        09/20/22 2309             Pollyann Savoy, MD 09/20/22 2309

## 2022-09-20 NOTE — ED Notes (Signed)
Called x3 no reply for triage.

## 2022-09-20 NOTE — ED Triage Notes (Signed)
BP reading high at home for last 3 days. Denies any related symptoms. Was referred to ed for eval due to history of strokes.

## 2022-09-25 NOTE — Progress Notes (Unsigned)
Office Visit    Patient Name: Christina Orozco Date of Encounter: 09/25/2022  Primary Care Provider:  Kerin Salen, PA-C Primary Cardiologist:  Chilton Si, MD Primary Electrophysiologist: None  Chief Complaint    Christina Orozco is a 52 y.o. 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  Past Medical History    Past Medical History:  Diagnosis Date   Anemia    severe   Anxiety    Asthma    CKD (chronic kidney disease)    stage IV   DDD (degenerative disc disease), cervical    Depression    Diabetes mellitus    Dislocated shoulder    right   GERD (gastroesophageal reflux disease)    Headache    migraines (about once a month)   History of kidney stones    Hypertension    Neuropathy    Peripheral vascular disease    Pneumonia    Stroke    Past Surgical History:  Procedure Laterality Date   ADENOIDECTOMY     APPENDECTOMY     CERVICAL SPINE SURGERY  06/2012   C5-C6   EYE SURGERY     left eye surgery    FOOT SURGERY     KENALOG INJECTION Left 03/10/2021   Procedure: Intravitreal Avastin Injection;  Surgeon: Stephannie Li, MD;  Location: [facility] OR;  Service: Ophthalmology;  Laterality: Left;   LOOP RECORDER INSERTION N/A 08/27/2017   Procedure: LOOP RECORDER INSERTION;  Surgeon: Thurmon Fair, MD;  Location: [facility] INVASIVE CV LAB;  Service: Cardiovascular;  Laterality: N/A;   MEMBRANE PEEL Left 03/10/2021   Procedure: MEMBRANE PEEL;  Surgeon: Stephannie Li, MD;  Location: [facility] OR;  Service: Ophthalmology;  Laterality: Left;   PARS PLANA VITRECTOMY Left 03/10/2021   Procedure: TWENTY-FIVE GAUGE PARS PLANA VITRECTOMY;  Surgeon: Stephannie Li, MD;  Location: [facility] OR;  Service: Ophthalmology;  Laterality: Left;   PHOTOCOAGULATION WITH LASER Left 03/10/2021   Procedure: PHOTOCOAGULATION WITH LASER;   Surgeon: Stephannie Li, MD;  Location: [facility] OR;  Service: Ophthalmology;  Laterality: Left;   SHOULDER SURGERY     TEE WITHOUT CARDIOVERSION N/A 08/27/2017   Procedure: TRANSESOPHAGEAL ECHOCARDIOGRAM (TEE);  Surgeon: Thurmon Fair, MD;  Location: [facility] ENDOSCOPY;  Service: Cardiovascular;  Laterality: N/A;   TONSILLECTOMY      Allergies  Allergies  Allergen Reactions   Lisinopril Swelling and Other (See Comments)    Facial swelling  angioedema   Penicillins Hives    Has patient had a PCN reaction causing immediate rash, facial/tongue/throat swelling, SOB or lightheadedness with hypotension: yes Has patient had a PCN reaction causing severe rash involving mucus membranes or skin necrosis: No Has patient had a PCN reaction that required hospitalization No Has patient had a PCN reaction occurring within the last 10 years: No If all of the above answers are "NO", then may proceed wit Rash on buttock that makes sitting very difficult   Quetiapine Other (See Comments)    Unknown    Gabapentin Swelling and Other (See Comments)    "I thought I was having another stroke." dizziness    History of Present Illness    Christina Orozco  is a 52 year old female with the above mention past medical history who presents today for posthospital follow-up for recent CVA.  Christina Orozco has been followed by Dr. Duke Salvia since 2018  for evaluation of HTN and LVH.  She had 2D echo completed that showed EF of 65 to 70% with septal hypertrophy.  She was noted to have very poorly controlled BP.  She has history of angioedema with ACEs and ARB's and has adverse reactions to clonidine.  She was admitted to [facility] with small pontine CVA 07/2017.  She was placed on ASA and Plavix.  2D echo at that time was unchanged from previous with EF of 65 to 70%.Carotid dopplers showed 1-39% narrowing bilaterally. She had TEE and loop recorder placed 3/2019She was seen in follow-up and was doing well.  She was admitted 03/2018 with seizure  that required intubation and CT was unremarkable for acute intracranial process.  Blood pressures were elevated with systolics in the 200s. MRI of the brain showed indistinct T2 hyperintensity in the pons that progressed from 07/25/2017.  She was started on Keppra and discharged on 04/02/2018 and is followed by Dr. Alton Revere [facility].  She suffered a CVA on 02/2019 with MRI showing subacute infarct in the dorsal midline of the pons.  She was admitted 06/19/2020 with sudden onset of right arm and leg weakness with aphasia SBP were 240s and MRI completed showing subacute infarct in the left lateral aspect of the pons. She was seen in the ED 10/02/20 with complaint of decreased visual acuity in left eye.  She had MRI that showed no acute intracranial abnormalities. She presented to [facility] 01/05/2021 with strokelike symptoms and underwent extensive workup that showed no significant abnormalities. She had been off of her Plavix for 2 months as she had run out.   She presented to the ED 10/21/2021 with expressive aphasia.  She did not qualify for code stroke due to last known normal being 3 days previous.Patient evaluated with labs and imaging and no evidence of acute infarct or other acute intracranial abnormalities.  She was seen 11/11/2021 with complaint of left facial numbness.  She was admitted for acute stroke. MRI shows diffusion restriction in the right cerebral peduncle with findings of previous stroke. 2D echocardiogram showed LV ejection fraction of 70 to 75% with no RWMA.  She was placed on aspirin and Brilinta for 30 days followed by aspirin and Plavix.  She returned to the ED at [facility] on 12/18/2021 with complaint of left-sided weakness. Her MRI showed 1.9 cm acute ischemic nonhemorrhagic infarct involving the deep white matter of the posterior right frontal centrum semi ovale.  She was found to have poorly controlled hypertension.  She was discharged to inpatient therapy for PT/OT.  She  was seen by Dr. Duke Salvia 01/23/2022 and blood pressures were well-controlled on amlodipine and carvedilol.  She was also euvolemic and being followed by nephrology at [facility].  She was seen 06/20/2022 with complaint of altered mental status.  She developed sudden onset of confusion while driving.  In the ED CT of the head showed no acute findings MRI showed no acute CVA.  This was deemed a possible TIA and EEG was completed that showed no signs of seizure.  She presented to the ED on 08/22/2022 as a code stroke after experiencing vision changes in her left eye and weakness in her left leg. MRI brain shows tiny punctate left subcortical lacunar infarct. No infarct is noted on the right side.  Neurology consulted and she was recommended to start Brilinta x 4 weeks.  She was discharged to a rehab facility and eventually discharged home on 09/05/2022.  She was  seen on 09/20/2022 for evaluation of hypertension. Trop is neg x 2. No evidence of end organ damage. Recommend she increase her Amlodipine to 10mg  daily.   Christina Orozco presents today for post ED and hospital follow-up for hypertension.  Since last being seen in the office patient reports she has been feeling well and has not experienced any new residual deficits from her previous strokes.  During today's visit her blood pressure is well-controlled at 122/64.  She is tolerating her current medications without any adverse reactions.  She checks her blood pressures at home however they are inaccurate due to a broken cuff.  She is currently having home PT and OT therapy that sometimes is canceled due to elevated blood pressure readings that may be faulty.  She reports that when her rings are hard to get off her blood pressure is usually elevated.  We discussed the importance of maintaining a low-sodium diet to help control her hypertension.  She is currently relying on others for driving and during our visit and multiple phone calls inquiring about the length of her  visit and when it would be completed.  We discussed the importance of managing stress and anxiety to help with reducing patient denies chest pain, palpitations, dyspnea, PND, orthopnea, nausea, vomiting, dizziness, syncope, edema, weight gain, or early satiety.   Home Medications    Current Outpatient Medications  Medication Sig Dispense Refill   albuterol (PROAIR HFA) 108 (90 Base) MCG/ACT inhaler INHALE 1 OR 2 PUFFS INTO THE LUNGS EVERY 6 HOURS AS NEEDED FOR WHEEZING OR SHORTNESS OF BREATH (Patient taking differently: Inhale 2 puffs into the lungs as needed for shortness of breath or wheezing.) 8.5 g 0   amLODipine (NORVASC) 10 MG tablet Take 1 tablet (10 mg total) by mouth daily. 30 tablet 0   aspirin EC 81 MG tablet Take 1 tablet (81 mg total) by mouth daily. 30 tablet 0   atorvastatin (LIPITOR) 80 MG tablet Take 1 tablet (80 mg total) by mouth at bedtime. 30 tablet 0   glucose blood (CONTOUR NEXT TEST) test strip 1 each by Other route 2 (two) times daily. And lancets 2/day (Patient taking differently: 1 each by Other route 2 (two) times daily.) 100 each 12   montelukast (SINGULAIR) 10 MG tablet Take 10 mg by mouth at bedtime.     OZEMPIC, 0.25 OR 0.5 MG/DOSE, 2 MG/3ML SOPN Inject 0.5 mg into the skin See admin instructions. Inject 0.5 mg subcutaneous every 10 days     pantoprazole (PROTONIX) 40 MG tablet Take 1 tablet (40 mg total) by mouth daily. 30 tablet 0   No current facility-administered medications for this visit.     Review of Systems  Please see the history of present illness.    (+) Stress (+) Anxiety  All other systems reviewed and are otherwise negative except as noted above.  Physical Exam    Wt Readings from Last 3 Encounters:  08/26/22 151 lb 10.8 oz (68.8 kg)  08/22/22 141 lb 15.6 oz (64.4 kg)  06/21/22 127 lb 12.8 oz (58 kg)   ZO:XWRUE were no vitals filed for this visit.,There is no height or weight on file to calculate BMI.  Constitutional:      Appearance:  Healthy appearance. Not in distress.  Neck:     Vascular: JVD normal.  Pulmonary:     Effort: Pulmonary effort is normal.     Breath sounds: No wheezing. No rales. Diminished in the bases Cardiovascular:  Normal rate. Regular rhythm. Normal S1. Normal S2.      Murmurs: There is no murmur.  Edema:    Peripheral edema absent.  Abdominal:     Palpations: Abdomen is soft non tender. There is no hepatomegaly.  Skin:    General: Skin is warm and dry.  Neurological:     General: No focal deficit present.     Mental Status: Alert and oriented to person, place and time.     Cranial Nerves: Cranial nerves are intact.  EKG/LABS/ Recent Cardiac Studies    ECG personally reviewed by me today -none completed today  Lab Results  Component Value Date   WBC 4.7 09/20/2022   HGB 10.3 (L) 09/20/2022   HCT 31.4 (L) 09/20/2022   MCV 88.2 09/20/2022   PLT 252 09/20/2022   Lab Results  Component Value Date   CREATININE 2.74 (H) 09/20/2022   BUN 40 (H) 09/20/2022   NA 142 09/20/2022   K 3.7 09/20/2022   CL 110 09/20/2022   CO2 20 (L) 09/20/2022   Lab Results  Component Value Date   ALT 15 08/26/2022   AST 20 08/26/2022   ALKPHOS 43 08/26/2022   BILITOT 0.3 08/26/2022   Lab Results  Component Value Date   CHOL 121 08/23/2022   HDL 77 08/23/2022   LDLCALC 35 08/23/2022   TRIG 43 08/23/2022   CHOLHDL 1.6 08/23/2022    Lab Results  Component Value Date   HGBA1C 6.3 (H) 08/22/2022    Cardiac Studies & Procedures     STRESS TESTS  MYOCARDIAL PERFUSION IMAGING 09/28/2017  Narrative  Normal perfusion  Nuclear stress EF: 61%.  The study is normal.  This is a low risk study.   ECHOCARDIOGRAM  ECHOCARDIOGRAM COMPLETE 08/23/2022  Narrative ECHOCARDIOGRAM REPORT    Patient Name:   Christina Orozco Date of Exam: 08/23/2022 Medical Rec #:  [id number]        Height:       62.0 in Accession #:    [id number]       Weight:       142.0 lb Date of Birth:  16-Aug-1970        BSA:          1.653 m Patient Age:    51 years         BP:           166/75 mmHg Patient Gender: F                HR:           87 bpm. Exam Location:  Inpatient  Procedure: 2D Echo  Indications:    TIA  History:        Patient has prior history of Echocardiogram examinations, most recent 06/21/2022. Stroke; Risk Factors:Diabetes, Hypertension and Dyslipidemia.  Sonographer:    Cathie Hoops Referring Phys: CHING T TU  IMPRESSIONS   1. Left ventricular ejection fraction, by estimation, is 65 to 70%. Left ventricular ejection fraction by 3D volume is 68 %. The left ventricle has normal function. The left ventricle has no regional wall motion abnormalities. There is moderate asymmetric left ventricular hypertrophy of the basal-septal segment (14 mm). Hypertrophied papillary muscles. Indeterminate diastolic filling due to E-A fusion. 2. Right ventricular systolic function is normal. The right ventricular size is normal. 3. A small pericardial effusion is present. The pericardial effusion is anterior to the right ventricle. 4. The mitral valve is normal in structure. Trivial mitral valve regurgitation.  No evidence of mitral stenosis. 5. The aortic valve is tricuspid. Aortic valve regurgitation is not visualized. No aortic stenosis is present.  Conclusion(s)/Recommendation(s): No intracardiac source of embolism detected on this transthoracic study. Consider a transesophageal echocardiogram to exclude cardiac source of embolism if clinically indicated.  FINDINGS Left Ventricle: Left ventricular ejection fraction, by estimation, is 65 to 70%. Left ventricular ejection fraction by 3D volume is 68 %. The left ventricle has normal function. The left ventricle has no regional wall motion abnormalities. The left ventricular internal cavity size was normal in size. There is moderate asymmetric left ventricular hypertrophy of the basal-septal segment. Indeterminate diastolic filling due to E-A  fusion.  Right Ventricle: The right ventricular size is normal. No increase in right ventricular wall thickness. Right ventricular systolic function is normal.  Left Atrium: Left atrial size was normal in size.  Right Atrium: Right atrial size was normal in size.  Pericardium: A small pericardial effusion is present. The pericardial effusion is anterior to the right ventricle.  Mitral Valve: Chordal SAM. The mitral valve is normal in structure. Trivial mitral valve regurgitation. No evidence of mitral valve stenosis.  Tricuspid Valve: The tricuspid valve is normal in structure. Tricuspid valve regurgitation is trivial. No evidence of tricuspid stenosis.  Aortic Valve: The aortic valve is tricuspid. Aortic valve regurgitation is not visualized. No aortic stenosis is present. Aortic valve mean gradient measures 3.0 mmHg. Aortic valve peak gradient measures 5.3 mmHg. Aortic valve area, by VTI measures 2.26 cm.  Pulmonic Valve: The pulmonic valve was normal in structure. Pulmonic valve regurgitation is trivial. No evidence of pulmonic stenosis.  Aorta: The aortic root is normal in size and structure.  Venous: The inferior vena cava was not well visualized.  IAS/Shunts: The interatrial septum was not well visualized.   LEFT VENTRICLE PLAX 2D LVIDd:         3.80 cm         Diastology LVIDs:         2.20 cm         LV e' medial:    5.98 cm/s LV PW:         1.10 cm         LV E/e' medial:  15.0 LV IVS:        1.00 cm         LV e' lateral:   6.42 cm/s LVOT diam:     2.00 cm         LV E/e' lateral: 14.0 LV SV:         51 LV SV Index:   31 LVOT Area:     3.14 cm        3D Volume EF LV 3D EF:    Left ventricul LV Volumes (MOD)                            ar LV vol d, MOD    76.0 ml                    ejection A2C:                                        fraction LV vol d, MOD    110.0 ml  by 3D A4C:                                        volume is LV vol s, MOD     27.0 ml                    68 %. A2C: LV vol s, MOD    48.1 ml A4C:                           3D Volume EF: LV SV MOD A2C:   49.0 ml       3D EF:        68 % LV SV MOD A4C:   110.0 ml      LV EDV:       102 ml LV SV MOD BP:    52.8 ml       LV ESV:       32 ml LV SV:        70 ml  RIGHT VENTRICLE RV Basal diam:  2.40 cm RV Mid diam:    1.90 cm RV S prime:     16.50 cm/s TAPSE (M-mode): 2.2 cm  LEFT ATRIUM             Index        RIGHT ATRIUM           Index LA diam:        3.20 cm 1.94 cm/m   RA Area:     10.90 cm LA Vol (A2C):   21.4 ml 12.95 ml/m  RA Volume:   23.10 ml  13.98 ml/m LA Vol (A4C):   27.8 ml 16.82 ml/m LA Biplane Vol: 26.7 ml 16.16 ml/m AORTIC VALVE                    PULMONIC VALVE AV Area (Vmax):    2.36 cm     PV Vmax:       0.93 m/s AV Area (Vmean):   2.40 cm     PV Peak grad:  3.5 mmHg AV Area (VTI):     2.26 cm AV Vmax:           115.50 cm/s AV Vmean:          77.800 cm/s AV VTI:            0.224 m AV Peak Grad:      5.3 mmHg AV Mean Grad:      3.0 mmHg LVOT Vmax:         86.77 cm/s LVOT Vmean:        59.333 cm/s LVOT VTI:          0.161 m LVOT/AV VTI ratio: 0.72  AORTA Ao Root diam: 3.10 cm  MITRAL VALVE                TRICUSPID VALVE MV Area (PHT): 6.32 cm     TR Peak grad:   19.0 mmHg MV Decel Time: 120 msec     TR Vmax:        218.00 cm/s MR Peak grad: 25.4 mmHg MR Vmax:      252.00 cm/s   SHUNTS MV E velocity: 89.70 cm/s   Systemic VTI:  0.16 m MV A velocity: 105.00 cm/s  Systemic Diam: 2.00 cm MV E/A  ratio:  0.85  Weston Brass MD Electronically signed by Weston Brass MD Signature Date/Time: 08/23/2022/3:55:54 PM    Final   TEE  ECHO TEE 08/27/2017  Narrative *[place]* *[facility]* [address] [place], [place] [address] [phone]  ------------------------------------------------------------------- Transesophageal Echocardiography  Patient:    Christina Orozco, Christina Orozco MR #:        [id number] Study Date: 08/27/2017 Gender:     F Age:        46 Height:     160 cm Weight:     90.5 kg BSA:        2.05 m^2 Pt. Status: Room:  ATTENDING    Carney Corners K REFERRING    Corine Shelter K ADMITTING    Thurmon Fair, MD PERFORMING   Thurmon Fair, MD SONOGRAPHER  Celene Skeen, RDCS  cc:  ------------------------------------------------------------------- LV EF: 55% -   60%  ------------------------------------------------------------------- History:   PMH:  Stroke.  Risk factors:  Hypertension. Diabetes mellitus.  ------------------------------------------------------------------- Study Conclusions  - Left ventricle: Systolic function was normal. The estimated ejection fraction was in the range of 55% to 60%. Wall motion was normal; there were no regional wall motion abnormalities. Left ventricular diastolic function parameters were normal. - Left atrium: No evidence of thrombus in the atrial cavity or appendage. No evidence of thrombus in the atrial cavity or appendage. No spontaneous echo contrast was observed. - Right atrium: No evidence of thrombus in the atrial cavity or appendage. - Atrial septum: No defect or patent foramen ovale was identified. Echo contrast study showed no right-to-left atrial level shunt, at baseline or with provocation.  ------------------------------------------------------------------- Study data:   Study status:  Routine.  Consent:  The risks, benefits, and alternatives to the procedure were explained to the patient and informed consent was obtained.  Procedure:  The patient reported no pain pre or post test. Initial setup. The patient was brought to the laboratory. Surface ECG leads were monitored. Sedation. Conscious sedation was administered by cardiology staff. Transesophageal echocardiography. A transesophageal probe was inserted by the attending cardiologistwithout difficulty. Image quality  was adequate.  Study completion:  The patient tolerated the procedure well. There were no complications.          Diagnostic transesophageal echocardiography.  2D and color Doppler. Birthdate:  Patient birthdate: 1971-02-14.  Age:  Patient is 52 yr old.  Sex:  Gender: female.    BMI: 35.3 kg/m^2.  Blood pressure: 174/91  Patient status:  Outpatient.  Study date:  Study date: 08/27/2017. Study time: 11:52 AM.  Location:  Endoscopy.  -------------------------------------------------------------------  ------------------------------------------------------------------- Left ventricle:  Systolic function was normal. The estimated ejection fraction was in the range of 55% to 60%. Wall motion was normal; there were no regional wall motion abnormalities. Left ventricular diastolic function parameters were normal.  ------------------------------------------------------------------- Aortic valve:   Structurally normal valve. Trileaflet; normal thickness leaflets. Cusp separation was normal.  Doppler:  There was no significant regurgitation.  ------------------------------------------------------------------- Aorta:  There was no atheroma. There was no evidence for dissection. Aortic root: The aortic root was not dilated. Ascending aorta: The ascending aorta was normal in size. Aortic arch: The aortic arch was normal in size. Descending aorta: The descending aorta was normal in size.  ------------------------------------------------------------------- Mitral valve:   Structurally normal valve.   Leaflet separation was normal.  Doppler:  There was no significant regurgitation.  ------------------------------------------------------------------- Left atrium:  The atrium was normal in size.  No evidence of  thrombus in the atrial cavity or appendage.  No evidence of thrombus in the atrial cavity or appendage. No spontaneous echo contrast was observed. The appendage was morphologically a  left appendage, multilobulated, and of normal size. Emptying velocity was normal.  ------------------------------------------------------------------- Atrial septum:  No defect or patent foramen ovale was identified. Echo contrast study showed no right-to-left atrial level shunt, at baseline or with provocation.  ------------------------------------------------------------------- Right ventricle:  The cavity size was normal. Wall thickness was normal. Systolic function was normal.  ------------------------------------------------------------------- Pulmonic valve:    Structurally normal valve.  ------------------------------------------------------------------- Tricuspid valve:   Structurally normal valve.   Leaflet separation was normal.  Doppler:  There was no significant regurgitation.  ------------------------------------------------------------------- Pulmonary artery:   The main pulmonary artery was normal-sized.  ------------------------------------------------------------------- Right atrium:  The atrium was normal in size.  No evidence of thrombus in the atrial cavity or appendage. The appendage was morphologically a right appendage.  ------------------------------------------------------------------- Pericardium:  There was no pericardial effusion.  ------------------------------------------------------------------- Prepared and Electronically Authenticated by  Thurmon Fair, MD 2019-03-18T18:03:50            Assessment & Plan    1.  History of hypertensive heart disease: -Patient's blood pressure today was well-controlled at 122/64 -Continue amlodipine and carvedilol 12.5 mg twice daily 10 mg -Today patient reports sodium indiscretions with her diet due to limitations in ability to cook prepared fresh foods. -She was provided information on the DASH diet and also given information on low-sodium choices. -She was also provided a new blood pressure cuff and  blood pressure logs with instructions to bring all readings and cuff to upcoming visits.   2.  History of CVA with residual deficit: -Most recent CVA occurred 08/22/2022 as a code stroke after experiencing vision changes in her left eye and weakness in her left leg. MRI brain showed tiny punctate left subcortical lacunar infarct. -Today patient reports no new complaints since most recent ED visit. -Continue GDMT with Brilinta 90 mg twice daily and ASA 81 mg twice daily  3.  DM type II: -Patient's last hemoglobin A1c was 6.3 -Continue treatment plan per PCP  4.  CKD stage IV: -Patient's last creatinine was 2.7 -She is currently followed by nephrology at [facility]  5.  Hyperlipidemia: -Patient's LDL cholesterol was 105 -Continue Lipitor 80 mg daily  Disposition: Follow-up with Chilton Si, MD or APP as scheduled  Medication Adjustments/Labs and Tests Ordered: Current medicines are reviewed at length with the patient today.  Concerns regarding medicines are outlined above.   Signed, Napoleon Form, Leodis Rains, NP 09/25/2022, 8:28 AM [facility]  Note:  This document was prepared using Dragon voice recognition software and may include unintentional dictation errors.

## 2022-09-26 ENCOUNTER — Encounter: Payer: Self-pay | Admitting: Nurse Practitioner

## 2022-09-26 ENCOUNTER — Ambulatory Visit: Payer: 59 | Attending: Nurse Practitioner | Admitting: Nurse Practitioner

## 2022-09-26 VITALS — BP 122/64 | HR 72 | Ht 62.0 in | Wt 139.0 lb

## 2022-09-26 DIAGNOSIS — N1831 Chronic kidney disease, stage 3a: Secondary | ICD-10-CM | POA: Diagnosis not present

## 2022-09-26 DIAGNOSIS — I119 Hypertensive heart disease without heart failure: Secondary | ICD-10-CM

## 2022-09-26 DIAGNOSIS — I6930 Unspecified sequelae of cerebral infarction: Secondary | ICD-10-CM

## 2022-09-26 DIAGNOSIS — E1122 Type 2 diabetes mellitus with diabetic chronic kidney disease: Secondary | ICD-10-CM

## 2022-09-26 DIAGNOSIS — G459 Transient cerebral ischemic attack, unspecified: Secondary | ICD-10-CM

## 2022-09-26 DIAGNOSIS — N184 Chronic kidney disease, stage 4 (severe): Secondary | ICD-10-CM

## 2022-09-26 DIAGNOSIS — E7800 Pure hypercholesterolemia, unspecified: Secondary | ICD-10-CM

## 2022-09-26 NOTE — Patient Instructions (Signed)
Medication Instructions:  Your physician recommends that you continue on your current medications as directed. Please refer to the Current Medication list given to you today. *If you need a refill on your cardiac medications before your next appointment, please call your pharmacy*   Lab Work: None ordered If you have labs (blood work) drawn today and your tests are completely normal, you will receive your results only by: MyChart Message (if you have MyChart) OR A paper copy in the mail If you have any lab test that is abnormal or we need to change your treatment, we will call you to review the results.   Testing/Procedures: None Ordered   Follow-Up: At [facility], you and your health needs are our priority.  As part of our continuing mission to provide you with exceptional heart care, we have created designated Provider Care Teams.  These Care Teams include your primary Cardiologist (physician) and Advanced Practice Providers (APPs -  Physician Assistants and Nurse Practitioners) who all work together to provide you with the care you need, when you need it.  We recommend signing up for the patient portal called "MyChart".  Sign up information is provided on this After Visit Summary.  MyChart is used to connect with patients for Virtual Visits (Telemedicine).  Patients are able to view lab/test results, encounter notes, upcoming appointments, etc.  Non-urgent messages can be sent to your provider as well.   To learn more about what you can do with MyChart, go to [email or web address].    Your next appointment:   FOLLOW UP AS SCHEDULED    Provider:   Gillian Shields, NP    Other Instructions

## 2022-10-05 ENCOUNTER — Encounter (HOSPITAL_BASED_OUTPATIENT_CLINIC_OR_DEPARTMENT_OTHER): Payer: Self-pay | Admitting: Family

## 2022-10-05 ENCOUNTER — Ambulatory Visit (INDEPENDENT_AMBULATORY_CARE_PROVIDER_SITE_OTHER): Payer: 59 | Admitting: Family

## 2022-10-05 VITALS — BP 120/70 | HR 74 | Ht 62.0 in | Wt 139.0 lb

## 2022-10-05 DIAGNOSIS — I6930 Unspecified sequelae of cerebral infarction: Secondary | ICD-10-CM

## 2022-10-05 DIAGNOSIS — I119 Hypertensive heart disease without heart failure: Secondary | ICD-10-CM

## 2022-10-05 DIAGNOSIS — E785 Hyperlipidemia, unspecified: Secondary | ICD-10-CM

## 2022-10-05 DIAGNOSIS — R600 Localized edema: Secondary | ICD-10-CM | POA: Diagnosis not present

## 2022-10-05 DIAGNOSIS — G4733 Obstructive sleep apnea (adult) (pediatric): Secondary | ICD-10-CM

## 2022-10-05 MED ORDER — ATORVASTATIN CALCIUM 80 MG PO TABS
80.0000 mg | ORAL_TABLET | Freq: Every day | ORAL | 1 refills | Status: AC
Start: 2022-10-05 — End: ?

## 2022-10-05 MED ORDER — AMLODIPINE BESYLATE 10 MG PO TABS: 10.0000 mg | ORAL_TABLET | Freq: Every day | ORAL | 1 refills | Status: DC

## 2022-10-05 MED ORDER — CARVEDILOL 25 MG PO TABS: 25.0000 mg | ORAL_TABLET | Freq: Two times a day (BID) | ORAL | 1 refills | Status: DC

## 2022-10-05 MED ORDER — MEDICAL COMPRESSION STOCKINGS MISC
1.0000 [IU] | Freq: Every day | 1 refills | Status: DC
Start: 2022-10-05 — End: 2022-10-31
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
## 2022-10-05 NOTE — Patient Instructions (Addendum)
Medication Instructions:   INCREASE Carvedilol to  twice daily  New prescription sent to [facility].  Okay to start with new pill pack.  *If you need a refill on your cardiac medications before your next appointment, please call your pharmacy*  Follow-Up: At [facility], you and your health needs are our priority.  As part of our continuing mission to provide you with exceptional heart care, we have created designated Provider Care Teams.  These Care Teams include your primary Cardiologist (physician) and Advanced Practice Providers (APPs -  Physician Assistants and Nurse Practitioners) who all work together to provide you with the care you need, when you need it.  Your next appointment:   In 3- 4months with Christina Sorrow, NP and In 6-8 months with Christina Orozco   Other Instructions  Heart Healthy Diet Recommendations: A low-salt diet is recommended.  Less than 2g (2,000mg ) of sodium intake per day. If the nutritional label says it is more than 15% of your daily sodium, recommend not purchasing.  To prevent or reduce lower extremity swelling: Eat a low salt diet. Salt makes the body hold onto extra fluid which causes swelling. Sit with legs elevated. Wear knee-high compression stockings during the daytime. Ones labeled 15-20 mmHg provide good compression.  Can purchase at [facility]

## 2022-10-10 ENCOUNTER — Encounter: Payer: 59 | Attending: Physical Medicine & Rehabilitation | Admitting: Physical Medicine & Rehabilitation

## 2022-10-10 ENCOUNTER — Encounter: Payer: Self-pay | Admitting: Physical Medicine & Rehabilitation

## 2022-10-10 VITALS — BP 130/88 | HR 94 | Ht 62.0 in | Wt 137.0 lb

## 2022-10-10 DIAGNOSIS — F482 Pseudobulbar affect: Secondary | ICD-10-CM | POA: Insufficient documentation

## 2022-10-10 NOTE — Progress Notes (Signed)
Subjective:    Patient ID: Christina Orozco, female    DOB: [birth year], 52 y.o.   MRN: [id number] 52 y.o. female who presented to the ED on 08/22/2022 via EMS with complaints of left-sided weakness, dizziness and aphasia. Code stroke called and evaluated by neurology. CT head and CTA imaging were negative. Physical exam significant for motor 5 out of 5 in all extremities, no drift seen.  However, left foot 0/5 dorsi flexion and plantarflexion as patient unable to flex/extend foot at all. Positive LUE ataxia. MRI brain with punctate focus of acute/early subacute ischemia at the lateral margin of the left BG. Initiated Lovenox for DVT prophylaxis. She was on aspirin and Brilinta 60 mg BID prior to admission due to recurrent stroke 11/2021. Followed at [facility] neurology.  Neurology recommended aspirin and Brilinta for four weeks then stop the Brilinta and stay on aspirin alone. Her past medical history is significant for hypertension on Norvasc and Coreg, hyperlipidemia on atorvastatin 80 mg, DM2-controlled (A1c 5.9%), prior history of multiple strokes/TIA, GERD, depression on Cymbalta. She appears to be at risk for sleep apnea and is interested in considering participation in the sleep smart study.  She was given written information to review and decided to consent. CPAP tolerability test completed 3/15-3/16. She is tolerating regular diet. The patient continued to display a L lateral lean when sitting EOB, needing modA to prevent LOB, but was responding more clearly but with a delay as the session progressed. Per PT notes on 3/15, the patient stood to take steps towards [facility] to return to supine for her safety, needing modAx2 for all functional mobility. By the end of the session, she continued to display progress in her speech clarity and response speed, but was still delayed.  HPI CC:  Left upper arm weakness  Tripped over back pack once was using rolling walker Also tripped outdoors Bruised RIght hip and  right thigh but no sig injury , last fall was 2wks ago   Planning to go on a 3.5 hr drive, is afraid feels like something might go bad   Treated for sleep apnea, is down to 2 hrs  HHPT, OT and SLP  Has been "very emotional" since stroke , the patient states she sees a counselor but she does not feel it has been very helpful for her.  She has no uncontrolled laughing.  She has been on sertraline 50 mg nightly.  Mod I dressing Needs sup assist for getting in and out of tub   Right centrum semiovale infarct 2023, PRES 2019, left pontine inf 2019,who complains of Left 4 th and 5th finger numbness since the time of the stroke.  Pt denies injury or elbow trauma.  Does get symptoms when L elbow is bent > 90 degrees.    PMH- Bilateral carpal tunnel syndrome moderately severe in 2017 diagnoses by EMG/NCV PSH ACDF C5-6 in 2014  Please see EMG/NCV report  scanned under media tab MRI HEAD WITHOUT CONTRAST   TECHNIQUE: Multiplanar, multiecho pulse sequences of the brain and surrounding structures were obtained without intravenous contrast.   COMPARISON:  06/21/2022   FINDINGS: Brain: Punctate focus of acute/early subacute ischemia at the lateral margin of the left basal ganglia. Multiple chronic microhemorrhages in a predominantly central distribution. There is multifocal hyperintense T2-weighted signal within the white matter. Parenchymal volume and CSF spaces are normal. 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 The midline structures are normal.   Vascular: Normal flow voids.   Skull and upper cervical spine:  Normal marrow signal.   Sinuses/Orbits: Bilateral mastoid effusions. Normal orbits and paranasal sinuses.   Other: None   IMPRESSION: 1. Punctate focus of acute/early subacute ischemia at the lateral margin of the left basal ganglia. No acute hemorrhage or mass effect. 2. Multiple chronic microhemorrhages in a predominantly central distribution, consistent with chronic  hypertensive angiopathy. 3. 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     Electronically Signed   By: Deatra Robinson M.D.   On: 08/22/2022 19:39   Pain Inventory Average Pain 5 Pain Right Now 5 My pain is constant, sharp, burning, dull, stabbing, tingling, and aching  LOCATION OF PAIN  left shoulder  BOWEL Number of stools per week: maybe one per week Oral laxative use No  Type of laxative none Enema or suppository use No  History of colostomy No  Incontinent No   BLADDER Normal and Pads  Bladder incontinence Yes  Frequent urination Yes    Mobility walk with assistance use a walker how many minutes can you walk? 5 mins ability to climb steps?  no do you drive?  no Do you have any goals in this area?  yes  Function disabled: date disabled 2019 I need assistance with the following:  dressing, bathing, meal prep, household duties, and shopping Do you have any goals in this area?  yes  Neuro/Psych bladder control problems bowel control problems weakness trouble walking confusion depression anxiety  Prior Studies Any changes since last visit?  no  Physicians involved in your care Any changes since last visit?  no   Family History  Problem Relation Age of Onset   Vascular Disease Mother    CAD Mother    Heart failure Mother    Heart disease Other    Cancer Other        colon, stomach, pancreatic, lung   Diabetes Other    Breast cancer Sister 30   Seizures Maternal Uncle    Social History   Socioeconomic History   Marital status: Single    Spouse name: Not on file   Number of children: 3   Years of education: 12+   Highest education level: Some college, no degree  Occupational History   Not on file  Tobacco Use   Smoking status: Never   Smokeless tobacco: Never  Vaping Use   Vaping Use: Never used  Substance and Sexual Activity   Alcohol use: No   Drug use: No   Sexual activity: Not Currently  Other Topics Concern   Not on file   Social History Narrative   Lives with daughter   Caffeine use: "once in a blue moon"   Right handed   Social Determinants of Health   Financial Resource Strain: Not on file  Food Insecurity: No Food Insecurity (08/23/2022)   Hunger Vital Sign    Worried About Running Out of Food in the Last Year: Never true    Ran Out of Food in the Last Year: Never true  Transportation Needs: No Transportation Needs (08/23/2022)   PRAPARE - Administrator, Civil Service (Medical): No    Lack of Transportation (Non-Medical): No  Physical Activity: Not on file  Stress: Not on file  Social Connections: Not on file   Past Surgical History:  Procedure Laterality Date   ADENOIDECTOMY     APPENDECTOMY     CERVICAL SPINE SURGERY  06/2012   C5-C6   EYE SURGERY     left eye surgery    FOOT SURGERY  KENALOG INJECTION Left 03/10/2021   Procedure: Intravitreal Avastin Injection;  Surgeon: Stephannie Li, MD;  Location[facility];  Service: Ophthalmology;  Laterality: Left;   LOOP RECORDER INSERTION N/A 08/27/2017   Procedure: LOOP RECORDER INSERTION;  Surgeon: Thurmon Fair, MD;  Location: [facility] INVASIVE CV LAB;  Service: Cardiovascular;  Laterality: N/A;   MEMBRANE PEEL Left 03/10/2021   Procedure: MEMBRANE PEEL;  Surgeon: Stephannie Li, MD;  Location: [facility] OR;  Service: Ophthalmology;  Laterality: Left;   PARS PLANA VITRECTOMY Left 03/10/2021   Procedure: TWENTY-FIVE GAUGE PARS PLANA VITRECTOMY;  Surgeon: Stephannie Li, MD;  Location: [facility] OR;  Service: Ophthalmology;  Laterality: Left;   PHOTOCOAGULATION WITH LASER Left 03/10/2021   Procedure: PHOTOCOAGULATION WITH LASER;  Surgeon: Stephannie Li, MD;  Location: [facility] OR;  Service: Ophthalmology;  Laterality: Left;   SHOULDER SURGERY     TEE WITHOUT CARDIOVERSION N/A 08/27/2017   Procedure: TRANSESOPHAGEAL ECHOCARDIOGRAM (TEE);  Surgeon: Thurmon Fair, MD;  Location: [facility] ENDOSCOPY;  Service: Cardiovascular;  Laterality: N/A;   TONSILLECTOMY     Past  Medical History:  Diagnosis Date   Anemia    severe   Anxiety    Asthma    CKD (chronic kidney disease)    stage IV   DDD (degenerative disc disease), cervical    Depression    Diabetes mellitus (HCC)    Dislocated shoulder    right   GERD (gastroesophageal reflux disease)    Headache    migraines (about once a month)   History of kidney stones    Hypertension    Neuropathy    Peripheral vascular disease (HCC)    Pneumonia    Stroke (HCC)    BP 130/88   Pulse 94   Ht 5\' 2"  (1.575 m)   Wt 137 lb (62.1 kg)   LMP 05/22/2018 (Within Days)   SpO2 98%   BMI 25.06 kg/m   Opioid Risk Score:   Fall Risk Score:  `1  Depression screen [facility] 2/9     10/10/2022   10:28 AM 04/27/2022    3:13 PM 03/02/2022   10:32 AM 01/17/2022   11:17 AM 07/05/2020    1:27 PM 09/13/2018   11:41 AM 07/15/2018   11:04 AM  Depression screen PHQ 2/9  Decreased Interest 0 1  1 3 1  0  Down, Depressed, Hopeless 0 1  2 3 1 1   PHQ - 2 Score 0 2  3 6 2 1   Altered sleeping   1 1 1     Tired, decreased energy   2 2 1     Change in appetite    2 1    Feeling bad or failure about yourself     3 2    Trouble concentrating    2 0    Moving slowly or fidgety/restless    1 0    Suicidal thoughts    0 0    PHQ-9 Score    14 11    Difficult doing work/chores    Somewhat difficult       Review of Systems  Genitourinary:  Positive for frequency and urgency.       Incontinence  Musculoskeletal:  Positive for gait problem.  Neurological:  Positive for weakness.  Hematological:        Depression, anxiety  Psychiatric/Behavioral:  Positive for confusion.       Objective:   Physical Exam  Left  Delt 3/5 Biceps 4/5 Triceps 4/5 Grip 4/5 Left hip flexor 4/5 knee  extensor 4/5 ankle dorsiflexor 4 -/5 Right side is 5/5 strength in the deltoid bicep tricep finger flexors and extensors Sensation reported is equal to light touch bilateral upper limbs Tone without spasticity bilateral upper and lower  limbs Ambulates with a rolling walker i.e. Cadillac walker no evidence of toe drag or knee instability modified independent with transfers. She is able to stand independently without loss of balance with increased base of support Mood and affect with emotional lability cries when talking about her recovery from the stroke.  No uncontrollable laughing. No rapid cycling      Assessment & Plan:   1.  History of multiple strokes her latest stroke was in the left basal ganglia but clinically the patient states her new issue was left upper extremity weakness.  She does have mild to moderate weakness of the left upper extremity.  Patient has a history of cervical spinal stenosis but is not complaining of increasing neck pain or left upper extremity pain.  She has had with each successive stroke some decline in function and incomplete recovery.  She is having problems adjusting to that.  Would like her to see our neuropsychologist Dr. Kieth Brightly who has seen her in the hospital in the past.  Question if she would be a candidate for Nuedexta or whether SSRI would need to be adjusted.  Patient will follow-up with [facility] neurology. Physical medicine rehab follow-up in 3 months.

## 2022-10-12 ENCOUNTER — Encounter (HOSPITAL_COMMUNITY): Payer: Self-pay

## 2022-10-12 ENCOUNTER — Telehealth: Payer: Self-pay | Admitting: Cardiovascular Disease

## 2022-10-12 DIAGNOSIS — I119 Hypertensive heart disease without heart failure: Secondary | ICD-10-CM

## 2022-10-12 MED ORDER — CARVEDILOL 12.5 MG PO TABS
12.5000 mg | ORAL_TABLET | Freq: Two times a day (BID) | ORAL | 3 refills | Status: DC
Start: 2022-10-12 — End: 2023-08-28

## 2022-10-12 NOTE — Telephone Encounter (Signed)
Updated Rx sent to pharmacy

## 2022-10-12 NOTE — Telephone Encounter (Signed)
Pt c/o medication issue:  1. Name of Medication:   carvedilol (COREG) 25 MG tablet    2. How are you currently taking this medication (dosage and times per day)? Take 1 tablet (25 mg total) by mouth 2 (two) times daily with a meal.   3. Are you having a reaction (difficulty breathing--STAT)? No  4. What is your medication issue? Pt stated there was a plan to increase this above medication but she called office to let provider know that she'd rather not due to her not wanting her BP to go too low. She would like a callback regarding this matter if and when possible. Please advise

## 2022-10-12 NOTE — Telephone Encounter (Signed)
Spoke with patient who stated blood pressure at home is now back in the 120's with Carvedilol 12.5 mg twice a day She would like to continue 12.5 mg twice a day, continue to monitor, and call back if starts to go above 130 Advised ok to continue 12.5 mg twice a day, will make [facility] aware and if further recommendations will call back

## 2022-10-12 NOTE — Telephone Encounter (Signed)
Continuing Carvedilol 12.5mg  BID and monitoring at home is fine. Okay to send new Rx. TY!  Alver Sorrow, NP

## 2022-10-13 ENCOUNTER — Encounter (HOSPITAL_COMMUNITY): Payer: Self-pay

## 2022-10-19 ENCOUNTER — Encounter: Payer: 59 | Attending: Psychology | Admitting: Psychology

## 2022-10-19 DIAGNOSIS — R202 Paresthesia of skin: Secondary | ICD-10-CM | POA: Insufficient documentation

## 2022-10-19 DIAGNOSIS — F482 Pseudobulbar affect: Secondary | ICD-10-CM | POA: Diagnosis present

## 2022-10-19 DIAGNOSIS — I69398 Other sequelae of cerebral infarction: Secondary | ICD-10-CM | POA: Diagnosis not present

## 2022-10-19 DIAGNOSIS — I639 Cerebral infarction, unspecified: Secondary | ICD-10-CM | POA: Insufficient documentation

## 2022-10-19 DIAGNOSIS — R200 Anesthesia of skin: Secondary | ICD-10-CM | POA: Insufficient documentation

## 2022-10-19 DIAGNOSIS — R531 Weakness: Secondary | ICD-10-CM | POA: Diagnosis not present

## 2022-10-19 DIAGNOSIS — R269 Unspecified abnormalities of gait and mobility: Secondary | ICD-10-CM | POA: Insufficient documentation

## 2022-10-26 ENCOUNTER — Encounter (HOSPITAL_COMMUNITY): Payer: Self-pay | Admitting: Emergency Medicine

## 2022-10-26 ENCOUNTER — Other Ambulatory Visit: Payer: Self-pay

## 2022-10-26 ENCOUNTER — Emergency Department (HOSPITAL_COMMUNITY)
Admission: EM | Admit: 2022-10-26 | Discharge: 2022-10-26 | Disposition: A | Discharge status: Home / Self Care | Payer: 59 | Attending: Emergency Medicine | Admitting: Emergency Medicine

## 2022-10-26 DIAGNOSIS — E119 Type 2 diabetes mellitus without complications: Secondary | ICD-10-CM | POA: Diagnosis not present

## 2022-10-26 DIAGNOSIS — Z79899 Other long term (current) drug therapy: Secondary | ICD-10-CM | POA: Diagnosis not present

## 2022-10-26 DIAGNOSIS — R197 Diarrhea, unspecified: Secondary | ICD-10-CM | POA: Diagnosis not present

## 2022-10-26 DIAGNOSIS — Z7982 Long term (current) use of aspirin: Secondary | ICD-10-CM | POA: Diagnosis not present

## 2022-10-26 DIAGNOSIS — R112 Nausea with vomiting, unspecified: Secondary | ICD-10-CM | POA: Diagnosis not present

## 2022-10-26 DIAGNOSIS — Z8673 Personal history of transient ischemic attack (TIA), and cerebral infarction without residual deficits: Secondary | ICD-10-CM | POA: Insufficient documentation

## 2022-10-26 DIAGNOSIS — N189 Chronic kidney disease, unspecified: Secondary | ICD-10-CM | POA: Diagnosis not present

## 2022-10-26 LAB — COMPREHENSIVE METABOLIC PANEL
ALT: 21 U/L (ref 0–44)
AST: 19 U/L (ref 15–41)
Albumin: 4.2 g/dL (ref 3.5–5.0)
Alkaline Phosphatase: 47 U/L (ref 38–126)
Anion gap: 10 (ref 5–15)
BUN: 38 mg/dL — ABNORMAL HIGH (ref 6–20)
CO2: 17 mmol/L — ABNORMAL LOW (ref 22–32)
Calcium: 9.4 mg/dL (ref 8.9–10.3)
Chloride: 114 mmol/L — ABNORMAL HIGH (ref 98–111)
Creatinine, Ser: 2.64 mg/dL — ABNORMAL HIGH (ref 0.44–1.00)
GFR, Estimated: 22 mL/min — ABNORMAL LOW (ref >60)
Glucose, Bld: 169 mg/dL — ABNORMAL HIGH (ref 70–99)
Potassium: 3.5 mmol/L (ref 3.5–5.1)
Sodium: 141 mmol/L (ref 135–145)
Total Bilirubin: 0.6 mg/dL (ref 0.3–1.2)
Total Protein: 8.1 g/dL (ref 6.5–8.1)

## 2022-10-26 LAB — CBC WITH DIFFERENTIAL/PLATELET
Abs Immature Granulocytes: 0.06 10*3/uL (ref 0.00–0.07)
Basophils Absolute: 0 10*3/uL (ref 0.0–0.1)
Basophils Relative: 0 %
Eosinophils Absolute: 0 10*3/uL (ref 0.0–0.5)
Eosinophils Relative: 0 %
HCT: 30.5 % — ABNORMAL LOW (ref 36.0–46.0)
Hemoglobin: 9.8 g/dL — ABNORMAL LOW (ref 12.0–15.0)
Immature Granulocytes: 1 %
Lymphocytes Relative: 11 %
Lymphs Abs: 1.3 10*3/uL (ref 0.7–4.0)
MCH: 29.1 pg (ref 26.0–34.0)
MCHC: 32.1 g/dL (ref 30.0–36.0)
MCV: 90.5 fL (ref 80.0–100.0)
Monocytes Absolute: 0.4 10*3/uL (ref 0.1–1.0)
Monocytes Relative: 3 %
Neutro Abs: 10.1 10*3/uL — ABNORMAL HIGH (ref 1.7–7.7)
Neutrophils Relative %: 85 %
Platelets: 234 10*3/uL (ref 150–400)
RBC: 3.37 MIL/uL — ABNORMAL LOW (ref 3.87–5.11)
RDW: 14.3 % (ref 11.5–15.5)
WBC: 11.8 10*3/uL — ABNORMAL HIGH (ref 4.0–10.5)
nRBC: 0 % (ref 0.0–0.2)

## 2022-10-26 LAB — LIPASE, BLOOD: Lipase: 33 U/L (ref 11–51)

## 2022-10-26 MED ORDER — SODIUM CHLORIDE 0.9 % IV BOLUS
1000.0000 mL | Freq: Once | INTRAVENOUS | Status: AC
  Administered 2022-10-26: 1000 mL via INTRAVENOUS

## 2022-10-26 MED ORDER — METOCLOPRAMIDE HCL 5 MG/ML IJ SOLN
10.0000 mg | INTRAMUSCULAR | Status: AC
  Administered 2022-10-26: 10 mg via INTRAVENOUS
  Filled 2022-10-26: qty 2

## 2022-10-26 MED ORDER — ONDANSETRON 4 MG PO TBDP: 4.0000 mg | ORAL_TABLET | Freq: Three times a day (TID) | ORAL | 0 refills | Status: DC | PRN

## 2022-10-26 MED ORDER — ONDANSETRON HCL 4 MG/2ML IJ SOLN
4.0000 mg | Freq: Once | INTRAMUSCULAR | Status: AC
  Administered 2022-10-26: 4 mg via INTRAVENOUS
  Filled 2022-10-26: qty 2

## 2022-10-26 NOTE — ED Provider Notes (Addendum)
[place] EMERGENCY DEPARTMENT AT [facility] Provider Note   CSN: [id number] Arrival date & time: 10/26/22  4098     History  Chief Complaint  Patient presents with   Emesis   Diarrhea    Christina Orozco is a 52 y.o. female.  The history is provided by the patient and medical records.  Emesis Associated symptoms: diarrhea   Diarrhea Associated symptoms: vomiting    69 female with history of prior stroke, anemia, chronic kidney disease, depression, hyperlipidemia, sleep apnea, DM 2, presenting to the ED with nausea, vomiting, and diarrhea.  Per daughter, this began yesterday.  She has been trying to eat soup, broth, and drink liquids but cannot hold anything down for more than a few minutes.  She is also had loose, watery diarrhea.  Denies any blood in the stool or emesis.  She has not had any sick contacts with similar.  No recent travel.  No medication tried prior to arrival.  Home Medications Prior to Admission medications   Medication Sig Start Date End Date Taking? Authorizing Provider  albuterol (PROAIR HFA) 108 (90 Base) MCG/ACT inhaler INHALE 1 OR 2 PUFFS INTO THE LUNGS EVERY 6 HOURS AS NEEDED FOR WHEEZING OR SHORTNESS OF BREATH Patient taking differently: Inhale 2 puffs into the lungs as needed for shortness of breath or wheezing. 04/12/18   Angiulli, Mcarthur Rossetti, PA-C  amLODipine (NORVASC) 10 MG tablet Take 1 tablet (10 mg total) by mouth daily. 10/05/22   Alver Sorrow, NP  aspirin EC 81 MG tablet Take 81 mg by mouth 2 (two) times daily. Swallow whole.    [provider]  atorvastatin (LIPITOR) 80 MG tablet Take 1 tablet (80 mg total) by mouth at bedtime. 10/05/22   Alver Sorrow, NP  carvedilol (COREG) 12.5 MG tablet Take 1 tablet (12.5 mg total) by mouth 2 (two) times daily with a meal. 10/12/22   Alver Sorrow, NP  Elastic Bandages & Supports (MEDICAL COMPRESSION STOCKINGS) MISC 1 Units by Does not apply route daily. Wear 15-52mmHg knee high  compression stockings during daytime hours. 10/05/22   Alver Sorrow, NP  fluticasone (FLONASE) 50 MCG/ACT nasal spray Place 2 sprays into both nostrils daily. 05/01/22   [provider]  glucose blood (CONTOUR NEXT TEST) test strip 1 each by Other route 2 (two) times daily. And lancets 2/day Patient taking differently: 1 each by Other route 2 (two) times daily. 04/12/18   Angiulli, Mcarthur Rossetti, PA-C  montelukast (SINGULAIR) 10 MG tablet Take 10 mg by mouth at bedtime.    [provider]  OZEMPIC, 0.25 OR 0.5 MG/DOSE, 2 MG/3ML SOPN Inject 0.5 mg into the skin See admin instructions. Inject 0.5 mg subcutaneous every 10 days 04/14/22   [provider]  pantoprazole (PROTONIX) 40 MG tablet Take 1 tablet (40 mg total) by mouth daily. 12/30/21   Love, Evlyn Kanner, PA-C  sertraline (ZOLOFT) 50 MG tablet Take 50 mg by mouth at bedtime. 09/18/22   [provider]  ticagrelor (BRILINTA) 90 MG TABS tablet Take 90 mg by mouth 2 (two) times daily.    [provider]      Allergies    Lisinopril, Penicillins, Quetiapine, and Gabapentin    Review of Systems   Review of Systems  Gastrointestinal:  Positive for diarrhea, nausea and vomiting.  All other systems reviewed and are negative.   Physical Exam Updated Vital Signs BP (!) 178/96 (BP Location: Right Arm)   Pulse (!) 104  Temp 99 F (37.2 C) (Oral)   Resp 16   Ht 5' 2.5" (1.588 m)   Wt 62.1 kg   LMP 05/22/2018 (Within Days)   SpO2 100%   BMI 24.66 kg/m   Physical Exam Vitals and nursing note reviewed.  Constitutional:      Appearance: She is well-developed.  HENT:     Head: Normocephalic and atraumatic.  Eyes:     Conjunctiva/sclera: Conjunctivae normal.     Pupils: Pupils are equal, round, and reactive to light.  Cardiovascular:     Rate and Rhythm: Normal rate and regular rhythm.     Heart sounds: Normal heart sounds.  Pulmonary:     Effort: Pulmonary effort is normal.     Breath sounds:  Normal breath sounds.  Abdominal:     General: Bowel sounds are normal.     Palpations: Abdomen is soft.     Tenderness: There is no abdominal tenderness. There is no guarding.     Comments: Soft, non-tender  Musculoskeletal:        General: Normal range of motion.     Cervical back: Normal range of motion.  Skin:    General: Skin is warm and dry.  Neurological:     Mental Status: She is alert and oriented to person, place, and time.     ED Results / Procedures / Treatments   Labs (all labs ordered are listed, but only abnormal results are displayed) Labs Reviewed  CBC WITH DIFFERENTIAL/PLATELET - Abnormal; Notable for the following components:      Result Value   WBC 11.8 (*)    RBC 3.37 (*)    Hemoglobin 9.8 (*)    HCT 30.5 (*)    Neutro Abs 10.1 (*)    All other components within normal limits  COMPREHENSIVE METABOLIC PANEL - Abnormal; Notable for the following components:   Chloride 114 (*)    CO2 17 (*)    Glucose, Bld 169 (*)    BUN 38 (*)    Creatinine, Ser 2.64 (*)    GFR, Estimated 22 (*)    All other components within normal limits  LIPASE, BLOOD    EKG None  Radiology No results found.  Procedures Procedures    Medications Ordered in ED Medications  sodium chloride 0.9 % bolus 1,000 mL (0 mLs Intravenous Stopped 10/26/22 0626)  ondansetron (ZOFRAN) injection 4 mg (4 mg Intravenous Given 10/26/22 0439)  metoCLOPramide (REGLAN) injection 10 mg (10 mg Intravenous Given 10/26/22 1610)    ED Course/ Medical Decision Making/ A&P                             Medical Decision Making Amount and/or Complexity of Data Reviewed Labs: ordered. ECG/medicine tests: ordered and independent interpretation performed.  Risk Prescription drug management.   52 year old female presenting to the ED with nausea, vomiting, and diarrhea that began yesterday.  She has not had any sick contacts or recent travel.  She is afebrile and nontoxic in appearance here.  Her  abdomen is soft and nontender.  Suspect possibly viral etiology.  Will check labs.  Given IV fluids, Zofran.  6:11 AM Vomited after gingerale while trying to go to the bathroom.  Will give reglan and see how she does afterwards.  Labs are reassuring without leukocytosis.  Her renal function appears around baseline, no significant electrolyte derangement.  Normal lipase.  Not yet received additional medications at change of shift.  Care signed out to oncoming provider.  If feeling better and tolerated oral fluids, feel she is stable for discharge home with symptomatic care and PCP follow-up.  Final Clinical Impression(s) / ED Diagnoses Final diagnoses:  Nausea vomiting and diarrhea    Rx / DC Orders ED Discharge Orders          Ordered    ondansetron (ZOFRAN-ODT) 4 MG disintegrating tablet  Every 8 hours PRN        10/26/22 0615              Garlon Hatchet, PA-C 10/26/22 0632    Garlon Hatchet, PA-C 10/26/22 1610    Glynn Octave, MD 10/26/22 0730

## 2022-10-26 NOTE — ED Triage Notes (Signed)
Pt arrives with daughter c/o n/v/d since yesterday. Other daughter took vitals and states HR was 100 and BP was elevated as well. They called PCP and on call provider told patient to come to hospital for evaluation. Denies taking any meds for pain. Noted to be hypertensive but states that she threw up after taking BP meds.

## 2022-10-26 NOTE — ED Provider Notes (Signed)
Signout received on this 52 year old female.  Presented with gastroenteritis type symptoms.  Please see full HPI from [facility].  Patient at the time of signout is awaiting p.o. challenge.  If she tolerates that she is appropriate for discharge. Physical Exam  BP (!) 162/89   Pulse 100   Temp 99 F (37.2 C) (Oral)   Resp 20   Ht 5' 2.5" (1.588 m)   Wt 62.1 kg   LMP 05/22/2018 (Within Days)   SpO2 100%   BMI 24.66 kg/m     Procedures  Procedures  ED Course / MDM    Medical Decision Making Amount and/or Complexity of Data Reviewed Labs: ordered.  Risk Prescription drug management.   Notified by nurse that patient is tolerating p.o. intake.  Patient is requesting discharge.       Marita Kansas, PA-C 10/26/22 1610    Derwood Kaplan, MD 10/27/22 1517

## 2022-10-26 NOTE — Discharge Instructions (Signed)
Take the prescribed medication as directed.  Push oral fluids as much as you can. Follow-up with your primary care doctor. Return to the ED for new or worsening symptoms.

## 2022-10-26 NOTE — ED Notes (Signed)
Ginger ale given

## 2022-10-27 ENCOUNTER — Inpatient Hospital Stay (HOSPITAL_BASED_OUTPATIENT_CLINIC_OR_DEPARTMENT_OTHER)
Admission: EM | Admit: 2022-10-27 | Discharge: 2022-10-31 | DRG: 065 | Disposition: A | Discharge status: Home Health | Payer: 59 | Attending: Internal Medicine | Admitting: Internal Medicine

## 2022-10-27 ENCOUNTER — Emergency Department (HOSPITAL_BASED_OUTPATIENT_CLINIC_OR_DEPARTMENT_OTHER): Payer: 59

## 2022-10-27 ENCOUNTER — Other Ambulatory Visit: Payer: Self-pay

## 2022-10-27 ENCOUNTER — Encounter (HOSPITAL_BASED_OUTPATIENT_CLINIC_OR_DEPARTMENT_OTHER): Payer: Self-pay | Admitting: Emergency Medicine

## 2022-10-27 DIAGNOSIS — Z7985 Long-term (current) use of injectable non-insulin antidiabetic drugs: Secondary | ICD-10-CM

## 2022-10-27 DIAGNOSIS — I6783 Posterior reversible encephalopathy syndrome: Secondary | ICD-10-CM | POA: Diagnosis present

## 2022-10-27 DIAGNOSIS — H539 Unspecified visual disturbance: Secondary | ICD-10-CM

## 2022-10-27 DIAGNOSIS — I69354 Hemiplegia and hemiparesis following cerebral infarction affecting left non-dominant side: Secondary | ICD-10-CM

## 2022-10-27 DIAGNOSIS — G40909 Epilepsy, unspecified, not intractable, without status epilepticus: Secondary | ICD-10-CM | POA: Diagnosis present

## 2022-10-27 DIAGNOSIS — K219 Gastro-esophageal reflux disease without esophagitis: Secondary | ICD-10-CM | POA: Diagnosis present

## 2022-10-27 DIAGNOSIS — R29702 NIHSS score 2: Secondary | ICD-10-CM | POA: Diagnosis present

## 2022-10-27 DIAGNOSIS — H532 Diplopia: Secondary | ICD-10-CM | POA: Diagnosis not present

## 2022-10-27 DIAGNOSIS — E872 Acidosis, unspecified: Secondary | ICD-10-CM | POA: Diagnosis not present

## 2022-10-27 DIAGNOSIS — E1159 Type 2 diabetes mellitus with other circulatory complications: Secondary | ICD-10-CM | POA: Diagnosis present

## 2022-10-27 DIAGNOSIS — Z602 Problems related to living alone: Secondary | ICD-10-CM | POA: Diagnosis present

## 2022-10-27 DIAGNOSIS — Z880 Allergy status to penicillin: Secondary | ICD-10-CM

## 2022-10-27 DIAGNOSIS — R42 Dizziness and giddiness: Secondary | ICD-10-CM

## 2022-10-27 DIAGNOSIS — J4520 Mild intermittent asthma, uncomplicated: Secondary | ICD-10-CM | POA: Diagnosis present

## 2022-10-27 DIAGNOSIS — E119 Type 2 diabetes mellitus without complications: Secondary | ICD-10-CM | POA: Diagnosis present

## 2022-10-27 DIAGNOSIS — N184 Chronic kidney disease, stage 4 (severe): Secondary | ICD-10-CM | POA: Diagnosis present

## 2022-10-27 DIAGNOSIS — E1140 Type 2 diabetes mellitus with diabetic neuropathy, unspecified: Secondary | ICD-10-CM | POA: Diagnosis present

## 2022-10-27 DIAGNOSIS — Z8249 Family history of ischemic heart disease and other diseases of the circulatory system: Secondary | ICD-10-CM

## 2022-10-27 DIAGNOSIS — I6381 Other cerebral infarction due to occlusion or stenosis of small artery: Principal | ICD-10-CM | POA: Diagnosis present

## 2022-10-27 DIAGNOSIS — Z7902 Long term (current) use of antithrombotics/antiplatelets: Secondary | ICD-10-CM

## 2022-10-27 DIAGNOSIS — E1136 Type 2 diabetes mellitus with diabetic cataract: Secondary | ICD-10-CM | POA: Diagnosis present

## 2022-10-27 DIAGNOSIS — H0520 Unspecified exophthalmos: Secondary | ICD-10-CM | POA: Diagnosis present

## 2022-10-27 DIAGNOSIS — Z803 Family history of malignant neoplasm of breast: Secondary | ICD-10-CM

## 2022-10-27 DIAGNOSIS — G4733 Obstructive sleep apnea (adult) (pediatric): Secondary | ICD-10-CM | POA: Diagnosis present

## 2022-10-27 DIAGNOSIS — I1310 Hypertensive heart and chronic kidney disease without heart failure, with stage 1 through stage 4 chronic kidney disease, or unspecified chronic kidney disease: Secondary | ICD-10-CM | POA: Diagnosis present

## 2022-10-27 DIAGNOSIS — E1122 Type 2 diabetes mellitus with diabetic chronic kidney disease: Secondary | ICD-10-CM | POA: Diagnosis present

## 2022-10-27 DIAGNOSIS — E785 Hyperlipidemia, unspecified: Secondary | ICD-10-CM | POA: Diagnosis present

## 2022-10-27 DIAGNOSIS — Z7982 Long term (current) use of aspirin: Secondary | ICD-10-CM

## 2022-10-27 DIAGNOSIS — Z79899 Other long term (current) drug therapy: Secondary | ICD-10-CM

## 2022-10-27 DIAGNOSIS — D631 Anemia in chronic kidney disease: Secondary | ICD-10-CM | POA: Diagnosis present

## 2022-10-27 DIAGNOSIS — Z888 Allergy status to other drugs, medicaments and biological substances status: Secondary | ICD-10-CM

## 2022-10-27 DIAGNOSIS — I10 Essential (primary) hypertension: Secondary | ICD-10-CM | POA: Diagnosis present

## 2022-10-27 DIAGNOSIS — Z820 Family history of epilepsy and other diseases of the nervous system: Secondary | ICD-10-CM

## 2022-10-27 DIAGNOSIS — E1151 Type 2 diabetes mellitus with diabetic peripheral angiopathy without gangrene: Secondary | ICD-10-CM | POA: Diagnosis present

## 2022-10-27 DIAGNOSIS — I639 Cerebral infarction, unspecified: Secondary | ICD-10-CM

## 2022-10-27 DIAGNOSIS — E876 Hypokalemia: Secondary | ICD-10-CM | POA: Diagnosis present

## 2022-10-27 LAB — CBC
HCT: 30.6 % — ABNORMAL LOW (ref 36.0–46.0)
Hemoglobin: 10.2 g/dL — ABNORMAL LOW (ref 12.0–15.0)
MCH: 29.4 pg (ref 26.0–34.0)
MCHC: 33.3 g/dL (ref 30.0–36.0)
MCV: 88.2 fL (ref 80.0–100.0)
Platelets: 226 10*3/uL (ref 150–400)
RBC: 3.47 MIL/uL — ABNORMAL LOW (ref 3.87–5.11)
RDW: 14.3 % (ref 11.5–15.5)
WBC: 7.1 10*3/uL (ref 4.0–10.5)
nRBC: 0 % (ref 0.0–0.2)

## 2022-10-27 LAB — COMPREHENSIVE METABOLIC PANEL
ALT: 15 U/L (ref 0–44)
AST: 16 U/L (ref 15–41)
Albumin: 4.3 g/dL (ref 3.5–5.0)
Alkaline Phosphatase: 45 U/L (ref 38–126)
Anion gap: 10 (ref 5–15)
BUN: 34 mg/dL — ABNORMAL HIGH (ref 6–20)
CO2: 21 mmol/L — ABNORMAL LOW (ref 22–32)
Calcium: 9.4 mg/dL (ref 8.9–10.3)
Chloride: 111 mmol/L (ref 98–111)
Creatinine, Ser: 2.86 mg/dL — ABNORMAL HIGH (ref 0.44–1.00)
GFR, Estimated: 20 mL/min — ABNORMAL LOW (ref >60)
Glucose, Bld: 137 mg/dL — ABNORMAL HIGH (ref 70–99)
Potassium: 3.4 mmol/L — ABNORMAL LOW (ref 3.5–5.1)
Sodium: 142 mmol/L (ref 135–145)
Total Bilirubin: 0.3 mg/dL (ref 0.3–1.2)
Total Protein: 7.4 g/dL (ref 6.5–8.1)

## 2022-10-27 LAB — CBG MONITORING, ED: Glucose-Capillary: 148 mg/dL — ABNORMAL HIGH (ref 70–99)

## 2022-10-27 LAB — DIFFERENTIAL
Abs Immature Granulocytes: 0.02 10*3/uL (ref 0.00–0.07)
Basophils Absolute: 0 10*3/uL (ref 0.0–0.1)
Basophils Relative: 1 %
Eosinophils Absolute: 0.1 10*3/uL (ref 0.0–0.5)
Eosinophils Relative: 1 %
Immature Granulocytes: 0 %
Lymphocytes Relative: 34 %
Lymphs Abs: 2.4 10*3/uL (ref 0.7–4.0)
Monocytes Absolute: 0.5 10*3/uL (ref 0.1–1.0)
Monocytes Relative: 6 %
Neutro Abs: 4.1 10*3/uL (ref 1.7–7.7)
Neutrophils Relative %: 58 %

## 2022-10-27 LAB — ETHANOL: Alcohol, Ethyl (B): <10 mg/dL (ref <10)

## 2022-10-27 LAB — PROTIME-INR
INR: 1 (ref 0.8–1.2)
Prothrombin Time: 13.3 seconds (ref 11.4–15.2)

## 2022-10-27 LAB — APTT: aPTT: 26 seconds (ref 24–36)

## 2022-10-27 MED ORDER — SODIUM CHLORIDE 0.9% FLUSH
3.0000 mL | Freq: Once | INTRAVENOUS | Status: AC
  Administered 2022-10-27: 3 mL via INTRAVENOUS

## 2022-10-27 NOTE — ED Provider Notes (Signed)
[facility] Provider Note   CSN: [id number] Arrival date & time: 10/27/22  2052     History  Chief Complaint  Patient presents with  . Eye Problem    Christina Orozco is a 52 y.o. female.  52 year old female with history of multiple strokes with residual left-sided weakness, seizures, and CKD who presents emergency department with diplopia.  Reports that at 6:20 PM today she started having double vision.  Denies any blurry vision aside from her right eye which has a cataract and is chronic.  No new weakness or numbness.  Chronically weak on her left side.  No speech changes or facial droop.  Also has some mild dizziness that feels like the room is spinning.  Is on aspirin and Brilinta but no other blood thinners.  Says this happened with the previous stroke but that does symptoms resolved.       Home Medications Prior to Admission medications   Medication Sig Start Date End Date Taking? Authorizing Provider  albuterol (PROAIR HFA) 108 (90 Base) MCG/ACT inhaler INHALE 1 OR 2 PUFFS INTO THE LUNGS EVERY 6 HOURS AS NEEDED FOR WHEEZING OR SHORTNESS OF BREATH Patient taking differently: Inhale 2 puffs into the lungs as needed for shortness of breath or wheezing. 04/12/18   Angiulli, Mcarthur Rossetti, PA-C  amLODipine (NORVASC) 10 MG tablet Take 1 tablet (10 mg total) by mouth daily. 10/05/22   Alver Sorrow, NP  aspirin EC 81 MG tablet Take 81 mg by mouth 2 (two) times daily. Swallow whole.    [provider]  atorvastatin (LIPITOR) 80 MG tablet Take 1 tablet (80 mg total) by mouth at bedtime. 10/05/22   Alver Sorrow, NP  carvedilol (COREG) 12.5 MG tablet Take 1 tablet (12.5 mg total) by mouth 2 (two) times daily with a meal. 10/12/22   Alver Sorrow, NP  Elastic Bandages & Supports (MEDICAL COMPRESSION STOCKINGS) MISC 1 Units by Does not apply route daily. Wear 15-86mmHg knee high compression stockings during daytime hours. 10/05/22    Alver Sorrow, NP  fluticasone (FLONASE) 50 MCG/ACT nasal spray Place 2 sprays into both nostrils daily. 05/01/22   [provider]  glucose blood (CONTOUR NEXT TEST) test strip 1 each by Other route 2 (two) times daily. And lancets 2/day Patient taking differently: 1 each by Other route 2 (two) times daily. 04/12/18   Angiulli, Mcarthur Rossetti, PA-C  montelukast (SINGULAIR) 10 MG tablet Take 10 mg by mouth at bedtime.    [provider]  ondansetron (ZOFRAN-ODT) 4 MG disintegrating tablet Take 1 tablet (4 mg total) by mouth every 8 (eight) hours as needed for nausea. 10/26/22   Garlon Hatchet, PA-C  OZEMPIC, 0.25 OR 0.5 MG/DOSE, 2 MG/3ML SOPN Inject 0.5 mg into the skin See admin instructions. Inject 0.5 mg subcutaneous every 10 days 04/14/22   [provider]  pantoprazole (PROTONIX) 40 MG tablet Take 1 tablet (40 mg total) by mouth daily. 12/30/21   Love, Evlyn Kanner, PA-C  sertraline (ZOLOFT) 50 MG tablet Take 50 mg by mouth at bedtime. 09/18/22   [provider]  ticagrelor (BRILINTA) 90 MG TABS tablet Take 90 mg by mouth 2 (two) times daily.    [provider]      Allergies    Lisinopril, Penicillins, Quetiapine, and Gabapentin    Review of Systems   Review of Systems  Physical Exam Updated Vital Signs BP (!) 169/89   Pulse 81  Temp 98.6 F (37 C) (Oral)   Resp 18   LMP 05/22/2018 (Within Days)   SpO2 100%  Physical Exam Vitals and nursing note reviewed.  Constitutional:      General: She is not in acute distress.    Appearance: She is well-developed.  HENT:     Head: Normocephalic and atraumatic.     Right Ear: External ear normal.     Left Ear: External ear normal.     Nose: Nose normal.  Eyes:     Conjunctiva/sclera: Conjunctivae normal.     Pupils: Pupils are equal, round, and reactive to light.  Cardiovascular:     Rate and Rhythm: Normal rate and regular rhythm.  Pulmonary:     Effort: Pulmonary effort is normal. No  respiratory distress.     Breath sounds: Normal breath sounds.  Abdominal:     General: Abdomen is flat.     Palpations: Abdomen is soft.  Musculoskeletal:     Cervical back: Normal range of motion and neck supple.     Right lower leg: No edema.     Left lower leg: No edema.  Skin:    General: Skin is warm and dry.  Neurological:     Mental Status: She is alert and oriented to person, place, and time.     Comments: MENTAL STATUS: AAOx3 CRANIAL NERVES: II: Pupils equal and reactive 4 mm mm BL, no RAPD, right inferior temporal visual field cut III, IV, VI: With leftward gaze left eye has fast beating nystagmus to the left and right eye remains fixed at midline.  With rightward gaze both eyes are able to look to the right but there is torsional nystagmus of the right eye. V: normal sensation to light touch in V1, V2, and V3 segments bilaterally VII: no facial weakness or asymmetry, no nasolabial fold flattening VIII: normal hearing to speech and finger friction IX, X: normal palatal elevation, no uvular deviation XI: 5/5 head turn and 5/5 shoulder shrug bilaterally XII: midline tongue protrusion MOTOR: 4/5 strength in left upper extremity which is at baseline 5/5 strength in left lower extremity 5/5 strength in right upper and right lower extremity SENSORY: Normal sensation to light touch in all extremities COORD: Normal finger to nose and heel to shin, no tremor, no dysmetria   Psychiatric:        Mood and Affect: Mood normal.     ED Results / Procedures / Treatments   Labs (all labs ordered are listed, but only abnormal results are displayed) Labs Reviewed  CBC - Abnormal; Notable for the following components:      Result Value   RBC 3.47 (*)    Hemoglobin 10.2 (*)    HCT 30.6 (*)    All other components within normal limits  COMPREHENSIVE METABOLIC PANEL - Abnormal; Notable for the following components:   Potassium 3.4 (*)    CO2 21 (*)    Glucose, Bld 137 (*)    BUN  34 (*)    Creatinine, Ser 2.86 (*)    GFR, Estimated 20 (*)    All other components within normal limits  CBG MONITORING, ED - Abnormal; Notable for the following components:   Glucose-Capillary 148 (*)    All other components within normal limits  PROTIME-INR  APTT  DIFFERENTIAL  ETHANOL  RAPID URINE DRUG SCREEN, HOSP PERFORMED  URINALYSIS, ROUTINE W REFLEX MICROSCOPIC    EKG EKG Interpretation  Date/Time:  Friday Oct 27 2022 21:41:27 EDT Ventricular Rate:  77 PR Interval:  133 QRS Duration: 84 QT Interval:  401 QTC Calculation: 454 R Axis:   81 Text Interpretation: Sinus rhythm Anterior infarct, old Confirmed by Vonita Moss [id number]) on 10/27/2022 11:03:18 PM  Radiology CT HEAD CODE STROKE WO CONTRAST  Result Date: 10/27/2022 CLINICAL DATA:  Code stroke. Initial evaluation for neuro deficit, stroke. EXAM: CT HEAD WITHOUT CONTRAST TECHNIQUE: Contiguous axial images were obtained from the base of the skull through the vertex without intravenous contrast. RADIATION DOSE REDUCTION: This exam was performed according to the departmental dose-optimization program which includes automated exposure control, adjustment of the mA and/or kV according to patient size and/or use of iterative reconstruction technique. COMPARISON:  Prior study from 08/22/2022. FINDINGS: Brain: Mild cerebral and cerebellar atrophy for age. Moderate chronic microvascular ischemic disease with multiple remote lacunar infarcts about the deep gray nuclei. No acute intracranial hemorrhage. No acute large vessel territory infarct. No mass lesion or midline shift. No hydrocephalus or extra-axial fluid collection. Vascular: No abnormal hyperdense vessel. Skull: Scalp soft tissues and calvarium within normal limits. Sinuses/Orbits: Globes and orbital soft tissues demonstrate no acute finding. Paranasal sinuses mastoid air cells are clear. Other: None. ASPECTS [facility] Stroke Program Early CT Score) - Ganglionic level  infarction (caudate, lentiform nuclei, internal capsule, insula, M1-M3 cortex): 7 - Supraganglionic infarction (M4-M6 cortex): 3 Total score (0-10 with 10 being normal): 10 IMPRESSION: 1. No acute intracranial abnormality. 2. ASPECTS is 10. 3. Mildly advanced cerebral and cerebellar atrophy with moderate chronic microvascular ischemic disease. Results were called by telephone at the time of interpretation on 10/27/2022 at 10:40 pm to provider Vonita Moss , who verbally acknowledged these results. Electronically Signed   By: Rise Mu M.D.   On: 10/27/2022 22:42    Procedures Procedures   Medications Ordered in ED Medications  sodium chloride flush (NS) 0.9 % injection 3 mL (3 mLs Intravenous Given 10/27/22 2158)    ED Course/ Medical Decision Making/ A&P Clinical Course as of 10/27/22 2322  Fri Oct 27, 2022  2301 Dr Izola Price recommends admission. No tPA or thrombectomy indication.  Due to renal function recommends an MRA of the head and neck. [RP]  2322 Dr Dalene Carrow [RP]    Clinical Course User Index [RP] Rondel Baton, MD                             Medical Decision Making Amount and/or Complexity of Data Reviewed Labs: ordered. Radiology: ordered.  Risk Decision regarding hospitalization.   Ettie Chaviano is a 52 y.o. female with comorbidities that complicate the patient evaluation including multiple strokes with residual left-sided weakness, seizures, and CKD who presents emergency department with diplopia and dizziness with internuclear ophthalmoplegia on exam  Initial Ddx:  Stroke, MS, brain mass, recrudescence  MDM:  Concerned the patient may be having a stroke.  Does appear to be within the window at this time so will activate code stroke.  Could potentially be an MS plaque or brain mass as well but with her history of strokes in the past we will rule these out later on in her workup.  Does report that this has happened to her before but is not her baseline  so could potentially represent recrudescence of her old stroke.  Plan:  Code stroke activation Labs CT head (without contrast due to renal function) CBG  ED Summary/Re-evaluation:  Code stroke was activated and Dr. Izola Price from neurology has evaluated the patient.  Not  feel that any intervention was required.  Does feel that she would benefit from hospitalization to medicine was paged for admission.  This patient presents to the ED for concern of complaints listed in HPI, this involves an extensive number of treatment options, and is a complaint that carries with it a high risk of complications and morbidity. Disposition including potential need for admission considered.   Dispo: Admit to Floor  Additional history obtained from daughter Records reviewed Outpatient Clinic Notes The following labs were independently interpreted: Chemistry and show CKD I independently reviewed the following imaging with scope of interpretation limited to determining acute life threatening conditions related to emergency care: CT Head and agree with the radiologist interpretation with the following exceptions: none I personally reviewed and interpreted cardiac monitoring: normal sinus rhythm  I personally reviewed and interpreted the pt's EKG: see above for interpretation  I have reviewed the patients home medications and made adjustments as needed Consults: Hospitalist and Neurology        Final Clinical Impression(s) / ED Diagnoses Final diagnoses:  Cerebrovascular accident (CVA), unspecified mechanism (HCC)  Dizziness    Rx / DC Orders ED Discharge Orders     None         Rondel Baton, MD 10/27/22 2311

## 2022-10-27 NOTE — ED Notes (Signed)
Code stroke activated per dr. Eloise Harman, ct notified. Bedside neuro activated.

## 2022-10-27 NOTE — Consult Note (Signed)
[facility] Tele[facility]urology Consult Services   Patient Name:   Christina Orozco, Christina Orozco Date of Birth:   12-11-1970 Identification Number:   MRN - [id number] Date of Service:   10/27/2022 22:29:57  Diagnosis:       H53.8 - Blurred Vision  Impression:      1. vision loss  - the pattern of vision loss is not clearly stroke, given her complex medical history retinal issues, CRAO, and PRES also possible  - no pain to suggest GCA  - stroke last march, small vessel  Our recommendations are outlined below.  Recommendations:        Stroke/Telemetry Floor       Neuro Checks q4h, fall precautions       Bedside Swallow Eval       DVT Prophylaxis       IV Fluids, Normal Saline       Head of Bed 30 Degrees       Euglycemia and Avoid Hyperthermia (PRN Acetaminophen)       Antihypertensives PRN if Blood pressure is greater than 220/120 or there is a concern for End organ damage/contraindications for permissive HTN. If blood pressure is greater than 220/120 give labetalol PO or IV or Vasotec IV with a goal of 15% reduction in BP during the first 24 hours.       routine MRI brain without contrast       routine MRA head without contrast       carotid ultrasound       lipid panel, a1c       echocardiogram       PT/OT consults       Speech consult if failed swallow study       ophthalmology consult       continue home aspirin and brilinta    ------------------------------------------------------------------------------  Advanced Imaging: Advanced Imaging Deferred because:  Poor functional status at baseline, a greater risk than benefit with acute intervention   Metrics: Last Known Well: 10/27/2022 18:20:00 [facility] Notification Time: 10/27/2022 22:29:57 Arrival Time: 10/27/2022 21:15:00 Stamp Time: 10/27/2022 22:29:57 Initial Response Time: 10/27/2022 22:33:18 Symptoms: vision loss. Initial patient interaction: 10/27/2022 22:37:40 NIHSS Assessment Completed:  10/27/2022 22:45:00 Patient is not a candidate for Thrombolytic. Thrombolytic Medical Decision: 10/27/2022 22:45:00 Patient was not deemed candidate for Thrombolytic because of following reasons: Stroke in last 3 months.  CT head showed no acute hemorrhage or acute core infarct. I personally Reviewed the CT Head and it Showed No Acute Hemorrhage or Acute Core Infarct  Primary Provider Notified of Diagnostic Impression and Management Plan on: 10/27/2022 22:45:00    ------------------------------------------------------------------------------  History of Present Illness: Patient is a 52 year old Female.  Patient was brought by private transportation with symptoms of vision loss. Christina Orozco is a 51yo woman pmh ischemic strokes, PRES, single seizure 2019, CKD3, DM2, neuropathy, peripheral vascular disease who presents with vision loss. Sudden onset blurred vision and dizziness around 1820 this evening, there is no pain. has some chronic vision loss from cataracts and prior strokes. First stroke was in 2016, had poorly controlled diabetes at that time, says her diabetes was poorly controlled from intially dx in 1997 until the last couple of years, but now her a1c is less than 6. In 2019 a1c was 12.5. She is disabled at baseline from residual deficits from prior strokes. Requires a walker at baseline for ataxia, has used a walker for many years. Last stroke was in March 2024, showed L basal ganglia acute ischemic stroke. Mild  Left sided weakness and numbness at baseline    Past Medical History:      There is no history of Atrial Fibrillation  Medications:  No Anticoagulant use  Antiplatelet use: Yes aspirin, brilinta Reviewed EMR for current medications  Allergies:  Reviewed  Social History: Smoking: No Alcohol Use: No Drug Use: No  Family History:  There is no family history of premature cerebrovascular disease pertinent to this consultation  ROS : 14 Points Review  of Systems was performed and was negative except mentioned in HPI.  Past Surgical History: There Is No Surgical History Contributory To Today's Visit     Examination: BP(154/81), Pulse(81), Blood Glucose(140) 1A: Level of Consciousness - Alert; keenly responsive + 0 1B: Ask Month and Age - Both Questions Right + 0 1C: Blink Eyes & Squeeze Hands - Performs Both Tasks + 0 2: Test Horizontal Extraocular Movements - Normal + 0 3: Test Visual Fields - No Visual Loss + 0 4: Test Facial Palsy (Use Grimace if Obtunded) - Minor paralysis (flat nasolabial fold, smile asymmetry) + 1 5A: Test Left Arm Motor Drift - No Drift for 10 Seconds + 0 5B: Test Right Arm Motor Drift - No Drift for 10 Seconds + 0 6A: Test Left Leg Motor Drift - No Drift for 5 Seconds + 0 6B: Test Right Leg Motor Drift - No Drift for 5 Seconds + 0 7: Test Limb Ataxia (FNF/Heel-Shin) - No Ataxia + 0 8: Test Sensation - Normal; No sensory loss + 0 9: Test Language/Aphasia - Normal; No aphasia + 0 10: Test Dysarthria - Mild-Moderate Dysarthria: Slurring but can be understood + 1 11: Test Extinction/Inattention - No abnormality + 0  NIHSS Score: 2  NIHSS Free Text : Left NLF. Decreased central vision b/l, decreased superior visual fields b/l without overt cut.  Pre-Morbid Modified Rankin Scale: 3 Points = Moderate disability; requiring some help, but able to walk without assistance  Spoke with : ER physician  Patient/Family was informed the Neurology Consult would occur via TeleHealth consult by way of interactive audio and video telecommunications and consented to receiving care in this manner.   Patient is being evaluated for possible acute neurologic impairment and high probability of imminent or life-threatening deterioration. I spent total of 30 minutes providing care to this patient, including time for face to face visit via telemedicine, review of medical records, imaging studies and discussion of findings with  providers, the patient and/or family.   Dr Marijo File   [facility] For Inpatient follow-up with [facility] physician please call [facility] [phone]. This is not an outpatient service. Post hospital discharge, please contact hospital directly.  Please do not communicate with [facility] physicians via secure chat. If you have any questions, Please contact [facility]. Please call or reconsult our service if there are any clinical or diagnostic changes.

## 2022-10-27 NOTE — ED Notes (Signed)
Report called to [facility], nurse on accepting unit. No further questions at this time.

## 2022-10-27 NOTE — ED Triage Notes (Addendum)
Pt presents to ED POV. Pt c/o suddenly bilateral decrease in vision @ 1820. Wears glasses at baseline but reports that they are not helping her at all. Pt reports that she is taking Birlinta. Last stroke 08/2022

## 2022-10-27 NOTE — Progress Notes (Signed)
2217: Tele stroke cart activated at this time.  2222: Pt to CT 2229: TSP paged  2232: Patient returned from CT  2237: TSP on camera at this time assessing patient.  2245: NCCT results given to TSP paged.  2310: TSRN and TSP off cart at this time. TSP to follow up with EDP.

## 2022-10-27 NOTE — ED Notes (Signed)
Teleneurologist speaking with patient.

## 2022-10-27 NOTE — ED Notes (Addendum)
Patient states approx 1820 she had sudden vision change, c/o blurry vision with diplopia. Reports slight dizziness after vision change, also endorses hx of migraines with frequent headaches. 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 Denies pain to eyes. MD to bedside for evaluation. Per daughter it seems that patient is having a more difficult time than usual figuring out her sentences. Reports hx strokes in the past with damage to right eye and residual left sided weakness. Patient states she normally ambulates with a walker due to balance difficulty.

## 2022-10-28 ENCOUNTER — Encounter (HOSPITAL_COMMUNITY): Payer: Self-pay | Admitting: Internal Medicine

## 2022-10-28 ENCOUNTER — Observation Stay (HOSPITAL_COMMUNITY): Payer: 59

## 2022-10-28 DIAGNOSIS — Z880 Allergy status to penicillin: Secondary | ICD-10-CM | POA: Diagnosis not present

## 2022-10-28 DIAGNOSIS — I1310 Hypertensive heart and chronic kidney disease without heart failure, with stage 1 through stage 4 chronic kidney disease, or unspecified chronic kidney disease: Secondary | ICD-10-CM | POA: Diagnosis not present

## 2022-10-28 DIAGNOSIS — H0520 Unspecified exophthalmos: Secondary | ICD-10-CM | POA: Diagnosis not present

## 2022-10-28 DIAGNOSIS — I69354 Hemiplegia and hemiparesis following cerebral infarction affecting left non-dominant side: Secondary | ICD-10-CM | POA: Diagnosis not present

## 2022-10-28 DIAGNOSIS — J4520 Mild intermittent asthma, uncomplicated: Secondary | ICD-10-CM | POA: Diagnosis not present

## 2022-10-28 DIAGNOSIS — Z7902 Long term (current) use of antithrombotics/antiplatelets: Secondary | ICD-10-CM | POA: Diagnosis not present

## 2022-10-28 DIAGNOSIS — K219 Gastro-esophageal reflux disease without esophagitis: Secondary | ICD-10-CM | POA: Diagnosis not present

## 2022-10-28 DIAGNOSIS — H539 Unspecified visual disturbance: Secondary | ICD-10-CM

## 2022-10-28 DIAGNOSIS — I6783 Posterior reversible encephalopathy syndrome: Secondary | ICD-10-CM | POA: Diagnosis not present

## 2022-10-28 DIAGNOSIS — D631 Anemia in chronic kidney disease: Secondary | ICD-10-CM | POA: Diagnosis not present

## 2022-10-28 DIAGNOSIS — I6381 Other cerebral infarction due to occlusion or stenosis of small artery: Secondary | ICD-10-CM | POA: Diagnosis not present

## 2022-10-28 DIAGNOSIS — E876 Hypokalemia: Secondary | ICD-10-CM | POA: Diagnosis not present

## 2022-10-28 DIAGNOSIS — E1136 Type 2 diabetes mellitus with diabetic cataract: Secondary | ICD-10-CM | POA: Diagnosis not present

## 2022-10-28 DIAGNOSIS — E1140 Type 2 diabetes mellitus with diabetic neuropathy, unspecified: Secondary | ICD-10-CM | POA: Diagnosis not present

## 2022-10-28 DIAGNOSIS — I639 Cerebral infarction, unspecified: Secondary | ICD-10-CM | POA: Diagnosis not present

## 2022-10-28 DIAGNOSIS — Z79899 Other long term (current) drug therapy: Secondary | ICD-10-CM | POA: Diagnosis not present

## 2022-10-28 DIAGNOSIS — Z888 Allergy status to other drugs, medicaments and biological substances status: Secondary | ICD-10-CM | POA: Diagnosis not present

## 2022-10-28 DIAGNOSIS — G4733 Obstructive sleep apnea (adult) (pediatric): Secondary | ICD-10-CM | POA: Diagnosis not present

## 2022-10-28 DIAGNOSIS — E872 Acidosis, unspecified: Secondary | ICD-10-CM | POA: Diagnosis not present

## 2022-10-28 DIAGNOSIS — E1151 Type 2 diabetes mellitus with diabetic peripheral angiopathy without gangrene: Secondary | ICD-10-CM | POA: Diagnosis not present

## 2022-10-28 DIAGNOSIS — G40909 Epilepsy, unspecified, not intractable, without status epilepticus: Secondary | ICD-10-CM | POA: Diagnosis not present

## 2022-10-28 DIAGNOSIS — E1122 Type 2 diabetes mellitus with diabetic chronic kidney disease: Secondary | ICD-10-CM | POA: Diagnosis not present

## 2022-10-28 DIAGNOSIS — H532 Diplopia: Secondary | ICD-10-CM | POA: Diagnosis present

## 2022-10-28 DIAGNOSIS — N184 Chronic kidney disease, stage 4 (severe): Secondary | ICD-10-CM | POA: Diagnosis not present

## 2022-10-28 DIAGNOSIS — I10 Essential (primary) hypertension: Secondary | ICD-10-CM | POA: Diagnosis not present

## 2022-10-28 DIAGNOSIS — E785 Hyperlipidemia, unspecified: Secondary | ICD-10-CM | POA: Diagnosis not present

## 2022-10-28 DIAGNOSIS — Z8249 Family history of ischemic heart disease and other diseases of the circulatory system: Secondary | ICD-10-CM | POA: Diagnosis not present

## 2022-10-28 LAB — URINALYSIS, ROUTINE W REFLEX MICROSCOPIC
Bacteria, UA: NONE SEEN
Bilirubin Urine: NEGATIVE
Glucose, UA: NEGATIVE mg/dL
Ketones, ur: NEGATIVE mg/dL
Leukocytes,Ua: NEGATIVE
Nitrite: NEGATIVE
Protein, ur: 100 mg/dL — AB
Specific Gravity, Urine: 1.014 (ref 1.005–1.030)
pH: 5.5 (ref 5.0–8.0)

## 2022-10-28 LAB — RAPID URINE DRUG SCREEN, HOSP PERFORMED
Amphetamines: NOT DETECTED
Barbiturates: NOT DETECTED
Benzodiazepines: NOT DETECTED
Cocaine: NOT DETECTED
Opiates: NOT DETECTED
Tetrahydrocannabinol: NOT DETECTED

## 2022-10-28 MED ORDER — SERTRALINE HCL 50 MG PO TABS
50.0000 mg | ORAL_TABLET | Freq: Every day | ORAL | Status: DC
  Filled 2022-10-28 (×2): qty 1

## 2022-10-28 MED ORDER — ALBUTEROL SULFATE (2.5 MG/3ML) 0.083% IN NEBU: 2.5000 mg | INHALATION_SOLUTION | RESPIRATORY_TRACT | Status: DC | PRN

## 2022-10-28 MED ORDER — PANTOPRAZOLE SODIUM 40 MG PO TBEC
40.0000 mg | DELAYED_RELEASE_TABLET | Freq: Every day | ORAL | Status: DC
  Administered 2022-10-28 – 2022-10-31 (×4): 40 mg via ORAL
  Filled 2022-10-28 (×4): qty 1

## 2022-10-28 MED ORDER — ACETAMINOPHEN 650 MG RE SUPP: 650.0000 mg | RECTAL | Status: DC | PRN

## 2022-10-28 MED ORDER — STROKE: EARLY STAGES OF RECOVERY BOOK
Freq: Once | Status: AC
  Filled 2022-10-28: qty 1

## 2022-10-28 MED ORDER — LABETALOL HCL 5 MG/ML IV SOLN: 10.0000 mg | INTRAVENOUS | Status: DC | PRN

## 2022-10-28 MED ORDER — SEMAGLUTIDE(0.25 OR 0.5MG/DOS) 2 MG/3ML ~~LOC~~ SOPN: 0.5000 mg | PEN_INJECTOR | SUBCUTANEOUS | Status: DC

## 2022-10-28 MED ORDER — ONDANSETRON 4 MG PO TBDP: 4.0000 mg | ORAL_TABLET | Freq: Three times a day (TID) | ORAL | Status: DC | PRN

## 2022-10-28 MED ORDER — ATORVASTATIN CALCIUM 80 MG PO TABS
80.0000 mg | ORAL_TABLET | Freq: Every day | ORAL | Status: DC
  Administered 2022-10-28 – 2022-10-30 (×3): 80 mg via ORAL
  Filled 2022-10-28 (×3): qty 1

## 2022-10-28 MED ORDER — ASPIRIN 81 MG PO TBEC
81.0000 mg | DELAYED_RELEASE_TABLET | Freq: Two times a day (BID) | ORAL | Status: DC
  Administered 2022-10-28 – 2022-10-31 (×7): 81 mg via ORAL
  Filled 2022-10-28 (×7): qty 1

## 2022-10-28 MED ORDER — HEPARIN SODIUM (PORCINE) 5000 UNIT/ML IJ SOLN
5000.0000 [IU] | Freq: Three times a day (TID) | INTRAMUSCULAR | Status: DC
  Administered 2022-10-28 – 2022-10-31 (×11): 5000 [IU] via SUBCUTANEOUS
  Filled 2022-10-28 (×11): qty 1

## 2022-10-28 MED ORDER — ACETAMINOPHEN 325 MG PO TABS
650.0000 mg | ORAL_TABLET | ORAL | Status: DC | PRN
  Administered 2022-10-28 – 2022-10-29 (×2): 650 mg via ORAL
  Filled 2022-10-28 (×3): qty 2

## 2022-10-28 MED ORDER — MONTELUKAST SODIUM 10 MG PO TABS
10.0000 mg | ORAL_TABLET | Freq: Every day | ORAL | Status: DC
  Administered 2022-10-28 – 2022-10-30 (×3): 10 mg via ORAL
  Filled 2022-10-28 (×3): qty 1

## 2022-10-28 MED ORDER — SODIUM CHLORIDE 0.9 % IV SOLN: INTRAVENOUS | Status: AC

## 2022-10-28 MED ORDER — ACETAMINOPHEN 160 MG/5ML PO SOLN: 650.0000 mg | ORAL | Status: DC | PRN

## 2022-10-28 MED ORDER — AMLODIPINE BESYLATE 10 MG PO TABS
10.0000 mg | ORAL_TABLET | Freq: Every day | ORAL | Status: DC
  Administered 2022-10-28 – 2022-10-31 (×4): 10 mg via ORAL
  Filled 2022-10-28 (×4): qty 1

## 2022-10-28 MED ORDER — CARVEDILOL 12.5 MG PO TABS
12.5000 mg | ORAL_TABLET | Freq: Two times a day (BID) | ORAL | Status: DC
  Administered 2022-10-28 – 2022-10-31 (×7): 12.5 mg via ORAL
  Filled 2022-10-28 (×7): qty 1

## 2022-10-28 MED ORDER — SENNOSIDES-DOCUSATE SODIUM 8.6-50 MG PO TABS: 1.0000 | ORAL_TABLET | Freq: Every evening | ORAL | Status: DC | PRN

## 2022-10-28 MED ORDER — MEDICAL COMPRESSION STOCKINGS MISC: 1.0000 [IU] | Freq: Every day | Status: DC

## 2022-10-28 MED ORDER — TICAGRELOR 90 MG PO TABS
90.0000 mg | ORAL_TABLET | Freq: Two times a day (BID) | ORAL | Status: DC
  Administered 2022-10-28 – 2022-10-31 (×7): 90 mg via ORAL
  Filled 2022-10-28 (×7): qty 1

## 2022-10-28 MED ORDER — FLUTICASONE PROPIONATE 50 MCG/ACT NA SUSP
2.0000 | Freq: Every day | NASAL | Status: DC
  Administered 2022-10-28 – 2022-10-31 (×4): 2 via NASAL
  Filled 2022-10-28: qty 16

## 2022-10-28 NOTE — Assessment & Plan Note (Signed)
Lat A1C 08/22/22 6.3%. CBGs at home have been well controlled.  Plan Continue home regimen

## 2022-10-28 NOTE — Assessment & Plan Note (Signed)
Stable w/o respiratory symptoms.  Plan Continue home regimen

## 2022-10-28 NOTE — Evaluation (Signed)
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 Recently discharged from [facility] in March 2024. Currently receiving home health nursing, PT, and OT services.   Clinical Impression   Pt admitted with the above concerns. Pt currently with functional limitations due to the deficits listed below (see OT Problem List). Pt lives alone and is able to complete ADL task and functional mobility at Mod I level. Endorses chronic visual deficits. Pt reports that her OS vision is not good and even though her OD has cataracts it is the eye with the best vision. Pt reports that she has not slept since 9AM yesterday because she has been without her CPAP machine. She does not sleep without her CPAP machine. It was mentioned that there may be an issue with her home CPAP machine and she has asked her brother to bring it to the hospital so it can be checked out. In the mean time, there is an order for a CPAP machine to be brought to the room and I will ask the nurse secretary to contact Respiratory to bring one up for pt to use. I'm hopeful that once pt is able to get sleep with her CPAP machine, her vision will return back to normal. Recommend pt return home and continue with already established Home health OT services. At this time, acute OT will sign off. Please re-order if additional services are needed and we will re-evaluate.        Recommendations for follow up therapy are one component of a multi-disciplinary discharge planning process, led by the attending physician.  Recommendations may be updated based on patient status, additional functional criteria and insurance authorization.   Assistance Recommended at Discharge PRN  Patient can return home with the following Assist for  transportation    Functional Status Assessment  Patient has had a recent decline in their functional status and demonstrates the ability to make significant improvements in function in a reasonable and predictable amount of time.  Equipment Recommendations  None recommended by OT       Precautions / Restrictions Precautions Precautions: None Restrictions Weight Bearing Restrictions: No      Mobility Bed Mobility Overal bed mobility:  (defer to PT evalution)      Patient Response: Flat affect  Transfers Overall transfer level:  (defer to PT evaluation)       Balance Overall balance assessment:  (Not tested)     ADL either performed or assessed with clinical judgement   ADL      General ADL Comments: Not tested. Pt reports no difficulty.     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 Reports left eye is her "bad" eye from her previous strokes. Today, she reports that her right eye is worse that it usually is. Yesterday was having diplopia. During evaluation: presents with resting left horizontal nystagmus.            Pertinent Vitals/Pain Pain Assessment Pain Assessment: Faces Faces Pain Scale: No hurt     Hand Dominance Right   Extremity/Trunk Assessment Upper Extremity Assessment Upper Extremity Assessment: Overall WFL for tasks  assessed   Lower Extremity Assessment Lower Extremity Assessment: Defer to PT evaluation   Cervical / Trunk Assessment Cervical / Trunk Assessment: Normal   Communication Communication Communication: No difficulties   Cognition Arousal/Alertness: Awake/alert Behavior During Therapy: WFL for tasks assessed/performed Overall Cognitive Status: Within Functional Limits for tasks assessed                          Home Living Family/patient expects to be discharged to:: Private residence Living Arrangements: Children Available Help at Discharge: Family;Available 24 hours/day Type of Home: Apartment Home Access: Level entry     Home Layout: One level     Bathroom Shower/Tub: Tub/shower unit;Curtain   Bathroom Toilet: Handicapped height Bathroom Accessibility: Yes How Accessible: Accessible via walker Home Equipment: Rolling Walker (2 wheels);Cane - single point;Shower seat;Grab bars - tub/shower;Hand held shower head;Grab bars - toilet          Prior Functioning/Environment Prior Level of Function : Independent/Modified Independent           OT Problem List: Impaired vision/perception         OT Goals(Current goals can be found in the care plan section) Acute Rehab OT Goals Patient Stated Goal: to get some sleep  OT Frequency:  1X visit       AM-PAC OT "6 Clicks" Daily Activity     Outcome Measure Help from another person eating meals?: None Help from another person taking care of personal grooming?: None Help from another person toileting, which includes using toliet, bedpan, or urinal?: None Help from another person bathing (including washing, rinsing, drying)?: None Help from another person to put on and taking off regular upper body clothing?: None Help from another person to put on and taking off regular lower body clothing?: None 6 Click Score: 24   End of Session    Activity Tolerance: Patient limited by fatigue (reports has not slept since 9AM yesterday) Patient left: in bed;with call bell/phone within reach  OT Visit Diagnosis: Low vision, both eyes (H54.2)                Time: 1141-1153 OT Time Calculation (min): 12 min Charges:  OT General Charges $OT Visit: 1 Visit OT Evaluation $OT Eval Low Complexity: 1 Low  Christina Orozco, OTR/L,CBIS  Supplemental OT - [facility] and [employer] Secure Chat Preferred    Christina Orozco, Charisse March 10/28/2022, 12:07 PM

## 2022-10-28 NOTE — Subjective & Objective (Addendum)
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 She reports that Wednesday 10/25/22 she was doing well - home health nurse checked BP, glucose both well controlled. Early Thursday 10/26/22 patient went to [facility] ED due to N/V and diarrhea. She was diagnosed with a stomach virus and went home. Friday, 10/27/22 she missed an appointment with her retinologist because her ride was late. She went shopping with her daughter and grandchildren and was doing well. Driving at she reports that precisely at 6:20 PM she developed double vision w/o headache. She denies any other symptoms. She had been diagnosed with proptosis by her ophthalmologist but otherwise was doing well.  She presented to [facility] for evaluation. Head CT was negative. Teleneuro recommended admission to [facility] for stroke workup.   Chart review: MRA head/neck 06/21/22 no acute findings, no LVO, positive ischemic changes                       Echo 08/23/22  EF 65-70% asymmetric LVH, no source of embolism                       TEE  08/24/17 - negative for source of embolism                       CTA head/neck 08/23/22 - no LVO, no stenosis carotids                       Lipid panel 08/23/22  Chol 135, HDL 7, LDL 35, Tgy 43                       A1C 08/22/22  6.3%

## 2022-10-28 NOTE — Assessment & Plan Note (Signed)
BP mildly elevated at admission. By her report BP has been well controlled at home. At last Cardiology OV she was advised to increase coreg to 25 mg BID but she was concerned for possible hypotension and continued at previous dose of coreg 12.5 mg BID and continued her other medications.  Plan Continue home regimen  Hydralazine prn ordered by neuro/receiving MD

## 2022-10-28 NOTE — Evaluation (Signed)
Physical Therapy Evaluation Patient Details Name: Christina Orozco MRN: [id number] DOB: July 24, 1970 Today's Date: 10/28/2022  History of Present Illness  52 yr old lady with presented to [facility] with diplopia. MRI with  Positive for punctate acute right temporal lobe white matter  lacunar infarct. And two small subacute appearing white matter  infarcts in the Left corona radiata, near the left splenium. 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 Recently discharged from [facility] in March 2024. Currently receiving home health nursing, PT, and OT services.  Clinical Impression  PTA, pt lives alone and is able to complete ADL's and mobility at modI level using RW. Pt has assist with IADL's and transportation. Pt endorses chronic visual deficits; has resting left horizontal nystagmus. Pt able to demonstrate ability to complete bed mobility, transfers and pre gait modI with use of RW. Declines further ambulation due to fatigue and fear of falling with visual impairment. Will continue to follow acutely. Recommend return home with continued HHPT services at d/c.     Recommendations for follow up therapy are one component of a multi-disciplinary discharge planning process, led by the attending physician.  Recommendations may be updated based on patient status, additional functional criteria and insurance authorization.  Follow Up Recommendations       Assistance Recommended at Discharge Frequent or constant Supervision/Assistance  Patient can return home with the following  A little help with walking and/or transfers;A little help with bathing/dressing/bathroom;Assistance with cooking/housework;Direct supervision/assist for medications management;Direct supervision/assist for financial management;Assist for transportation    Equipment Recommendations None recommended by PT  Recommendations for Other Services       Functional Status Assessment Patient has had a recent decline in  their functional status and demonstrates the ability to make significant improvements in function in a reasonable and predictable amount of time.     Precautions / Restrictions Precautions Precautions: Other (comment) Precaution Comments: visual impairment Restrictions Weight Bearing Restrictions: No      Mobility  Bed Mobility Overal bed mobility: Modified Independent                  Transfers Overall transfer level: Modified independent Equipment used: Rolling walker (2 wheels)               General transfer comment: Marching in place with RW modI    Ambulation/Gait               General Gait Details: deferred by pt  Stairs            Wheelchair Mobility    Modified Rankin (Stroke Patients Only) Modified Rankin (Stroke Patients Only) Pre-Morbid Rankin Score: Moderate disability Modified Rankin: Moderate disability     Balance Overall balance assessment: Needs assistance Sitting-balance support: Feet supported Sitting balance-Leahy Scale: Good     Standing balance support: Bilateral upper extremity supported, During functional activity Standing balance-Leahy Scale: Fair                               Pertinent Vitals/Pain Pain Assessment Pain Assessment: Faces Faces Pain Scale: No hurt    Home Living Family/patient expects to be discharged to:: Private residence Living Arrangements: Children Available Help at Discharge: Family;Available 24 hours/day Type of Home: Apartment Home Access: Level entry       Home Layout: One level Home Equipment: Agricultural consultant (2 wheels);Cane - single point;Shower seat;Grab bars - tub/shower;Hand held shower head;Grab bars -  toilet      Prior Function Prior Level of Function : Independent/Modified Independent                     Hand Dominance   Dominant Hand: Right    Extremity/Trunk Assessment   Upper Extremity Assessment Upper Extremity Assessment: Overall WFL for  tasks assessed    Lower Extremity Assessment Lower Extremity Assessment: RLE deficits/detail;LLE deficits/detail RLE Deficits / Details: Strength 5/5 LLE Deficits / Details: Strength 5/5    Cervical / Trunk Assessment Cervical / Trunk Assessment: Normal  Communication   Communication: No difficulties  Cognition Arousal/Alertness: Awake/alert Behavior During Therapy: WFL for tasks assessed/performed Overall Cognitive Status: Within Functional Limits for tasks assessed                                          General Comments      Exercises     Assessment/Plan    PT Assessment Patient needs continued PT services  PT Problem List Decreased balance       PT Treatment Interventions DME instruction;Gait training;Stair training;Functional mobility training;Therapeutic exercise;Therapeutic activities;Balance training;Patient/family education    PT Goals (Current goals can be found in the Care Plan section)  Acute Rehab PT Goals Patient Stated Goal: go home PT Goal Formulation: With patient Time For Goal Achievement: 11/11/22 Potential to Achieve Goals: Good    Frequency Min 4X/week     Co-evaluation               AM-PAC PT "6 Clicks" Mobility  Outcome Measure Help needed turning from your back to your side while in a flat bed without using bedrails?: None Help needed moving from lying on your back to sitting on the side of a flat bed without using bedrails?: None Help needed moving to and from a bed to a chair (including a wheelchair)?: None Help needed standing up from a chair using your arms (e.g., wheelchair or bedside chair)?: None Help needed to walk in hospital room?: A Little Help needed climbing 3-5 steps with a railing? : A Little 6 Click Score: 22    End of Session   Activity Tolerance: Patient limited by fatigue Patient left: in bed;with call bell/phone within reach;with bed alarm set Nurse Communication: Mobility status PT Visit  Diagnosis: Difficulty in walking, not elsewhere classified (R26.2)    Time: 0865-7846 PT Time Calculation (min) (ACUTE ONLY): 20 min   Charges:   PT Evaluation $PT Eval Low Complexity: 1 Low          Lillia Pauls, PT, DPT Acute Rehabilitation Services Office [phone]   Norval Morton 10/28/2022, 12:50 PM

## 2022-10-28 NOTE — Progress Notes (Signed)
Hospitalist Transfer Note:  Transferring facility: [facility] Requesting provider: Dr. Eloise Harman (EDP at [facility]) Reason for transfer: admission for further evaluation and management of diplopia.    52  year old female with medical history notable for multiple prior strokes, seizures, chronic kidney disease, who presented to [facility] ED complaining of acute onset of diplopia starting at 1820 on 10/27/2022, with ensuing persistence of the symptoms.  She reportedly has chronic deficits in the left upper and lower extremities.  CT head showed no evidence of acute process.  EDP at [facility] d/w on-call neurology (Dr. Izola Price), who rec [facility] admit to [facility] for further stroke work-up and conveyed that neurology will formally consult and see pt at [facility]. Dr. Izola Price conveyed that the patient would not be a candidate for tPA or thrombectomy.  He recommends additional imaging including MRI, MRA of the head and neck, performed without contrast in the setting of the patient's CKD.   Subsequently, I accepted this patient for transfer for observation admission to a med-tele bed at [facility] for further work-up and management of the above.      Nursing staff, Please call [facility] Admits & Consults System-Wide number on Amion [phone]) as soon as patient's arrival, so appropriate admitting provider can evaluate the pt.     Newton Pigg, DO Hospitalist

## 2022-10-28 NOTE — Progress Notes (Signed)
Pt's home CPAP setup at bedside. Pt states she can place on/off when ready per home use.

## 2022-10-28 NOTE — Progress Notes (Addendum)
STROKE TEAM PROGRESS NOTE   INTERVAL HISTORY No family at the bedside.  Patient is laying in the bed in no apparent distress.  She is very tearful that she has had some new strokes.  Vitals:   10/28/22 0538 10/28/22 0834 10/28/22 1133 10/28/22 1524  BP: (!) 160/88 (!) 154/84 138/78 131/78  Pulse: 79 70 77 78  Resp:  18 18 17   Temp: 97.9 F (36.6 C) 98 F (36.7 C) (!) 97.5 F (36.4 C) 98.1 F (36.7 C)  TempSrc: Oral Oral Oral Oral  SpO2: 100% 100% 100% 100%  Weight:      Height:       CBC:  Recent Labs  Lab 10/26/22 0401 10/27/22 2158  WBC 11.8* 7.1  NEUTROABS 10.1* 4.1  HGB 9.8* 10.2*  HCT 30.5* 30.6*  MCV 90.5 88.2  PLT 234 226   Basic Metabolic Panel:  Recent Labs  Lab 10/26/22 0401 10/27/22 2158  NA 141 142  K 3.5 3.4*  CL 114* 111  CO2 17* 21*  GLUCOSE 169* 137*  BUN 38* 34*  CREATININE 2.64* 2.86*  CALCIUM 9.4 9.4   Lipid Panel: No results for input(s): "CHOL", "TRIG", "HDL", "CHOLHDL", "VLDL", "LDLCALC" in the last 168 hours. HgbA1c: No results for input(s): "HGBA1C" in the last 168 hours. Urine Drug Screen:  Recent Labs  Lab 10/27/22 2357  LABOPIA NONE DETECTED  COCAINSCRNUR NONE DETECTED  LABBENZ NONE DETECTED  AMPHETMU NONE DETECTED  THCU NONE DETECTED  LABBARB NONE DETECTED    Alcohol Level  Recent Labs  Lab 10/27/22 2158  ETH <10    IMAGING past 24 hours MR ANGIO HEAD WO CONTRAST  Result Date: 10/28/2022 CLINICAL DATA:  52 year old female code stroke presentation yesterday. Advanced acute on chronic small vessel disease on MRI. EXAM: MRA HEAD WITHOUT CONTRAST TECHNIQUE: Angiographic images of the Circle of Willis were acquired using MRA technique without intravenous contrast. COMPARISON:  Brain MRI today. CTA head and neck 08/22/2022. Intracranial MRA 06/21/2022, and earlier. FINDINGS: Mildly motion degraded today. Anterior circulation: Antegrade flow in both ICA siphons appears stable from last year. No siphon stenosis. Ophthalmic and  left posterior communicating artery origins appear normal. Patent carotid termini. MCA and ACA origins appear normal. Diminutive anterior communicating artery. Visible ACA branches are stable and within normal limits. MCA M1 segments and bi/trifurcations remain patent without stenosis. Visible bilateral MCA branches are stable and within normal limits. Posterior circulation: Antegrade flow in the posterior circulation appears stable from last year. Dominant left vertebral artery. Both PICA origins remain patent. No convincing distal vertebral or basilar stenosis. SCA and PCA origins remain normal. Left posterior communicating artery is present. Bilateral PCA branches are within normal limits. Anatomic variants: Dominant left vertebral artery. Other: Numerous susceptibility foci in the brainstem and deep gray nuclei in keeping with the known chronic parenchymal microhemorrhages. Brain MRI today reported separately. IMPRESSION: Intracranial MRA remains negative, stable compared to June of last year. Electronically Signed   By: Odessa Fleming M.D.   On: 10/28/2022 08:07   MR BRAIN WO CONTRAST  Result Date: 10/28/2022 CLINICAL DATA:  52 year old female code stroke presentation yesterday. History of small vessel disease. EXAM: MRI HEAD WITHOUT CONTRAST TECHNIQUE: Multiplanar, multiecho pulse sequences of the brain and surrounding structures were obtained without intravenous contrast. COMPARISON:  Head CT yesterday.  Brain MRI 08/22/2022. FINDINGS: Brain: Small new areas of abnormal diffusion in the posterior left corona radiata (series 5, image 83), tracking toward the posterior left lentiform. And also along the  nearby lateral margin of the left splenium corpus callosum (series 5, image 84). These areas are not conspicuous on ADC and could be subacute. Additionally, there is a small round focus of restricted diffusion in the right temporal lobe periventricular white matter anteriorly series 5, image 69. This is about 5 mm.  Faint T2 and FLAIR hyperintensity here. No other diffusion restriction. Chronic severe signal abnormality in the brainstem and cerebellar peduncles which may in part be Wallerian degeneration. Multifocal chronic bilateral deep gray nuclei lacunar infarcts. Extensive cerebral white matter T2 and FLAIR hyperintensity including some areas of corpus callosum encephalomalacia and lesions oriented perpendicular to the lateral ventricles. But there is no cystic encephalomalacia of the corpus callosum. Outside of the diffusion findings described above signal appears stable throughout the brain. Moderate number of chronic microhemorrhages are scattered in the bilateral deep gray nuclei, and especially throughout the brainstem. Occasional hemispheric chronic microhemorrhages also. No midline shift, mass effect, evidence of mass lesion, ventriculomegaly, extra-axial collection or acute intracranial hemorrhage. Cervicomedullary junction and pituitary are within normal limits. Vascular: Major intracranial vascular flow voids are stable. MRA today is reported separately. Skull and upper cervical spine: Negative visible cervical spine. Visualized bone marrow signal is within normal limits. Sinuses/Orbits: Stable, negative. Other: Stable mild mastoid effusions. Benign right forehead scalp lipoma. IMPRESSION: 1. Positive for punctate acute right temporal lobe white matter lacunar infarct. And two small subacute appearing white matter infarcts in the Left corona radiata, near the left splenium. No associated hemorrhage or mass effect. 2. Underlying chronic severe small vessel disease, including numerous chronic microhemorrhages in the brainstem with Wallerian degeneration. Conspicuous periventricular white matter involvement, although chronic demyelinating disease is felt less likely. 3. MRA today is reported separately. Electronically Signed   By: Odessa Fleming M.D.   On: 10/28/2022 08:00   CT HEAD CODE STROKE WO CONTRAST  Result  Date: 10/27/2022 CLINICAL DATA:  Code stroke. Initial evaluation for neuro deficit, stroke. EXAM: CT HEAD WITHOUT CONTRAST TECHNIQUE: Contiguous axial images were obtained from the base of the skull through the vertex without intravenous contrast. RADIATION DOSE REDUCTION: This exam was performed according to the departmental dose-optimization program which includes automated exposure control, adjustment of the mA and/or kV according to patient size and/or use of iterative reconstruction technique. COMPARISON:  Prior study from 08/22/2022. FINDINGS: Brain: Mild cerebral and cerebellar atrophy for age. Moderate chronic microvascular ischemic disease with multiple remote lacunar infarcts about the deep gray nuclei. No acute intracranial hemorrhage. No acute large vessel territory infarct. No mass lesion or midline shift. No hydrocephalus or extra-axial fluid collection. Vascular: No abnormal hyperdense vessel. Skull: Scalp soft tissues and calvarium within normal limits. Sinuses/Orbits: Globes and orbital soft tissues demonstrate no acute finding. Paranasal sinuses mastoid air cells are clear. Other: None. ASPECTS [facility] Stroke Program Early CT Score) - Ganglionic level infarction (caudate, lentiform nuclei, internal capsule, insula, M1-M3 cortex): 7 - Supraganglionic infarction (M4-M6 cortex): 3 Total score (0-10 with 10 being normal): 10 IMPRESSION: 1. No acute intracranial abnormality. 2. ASPECTS is 10. 3. Mildly advanced cerebral and cerebellar atrophy with moderate chronic microvascular ischemic disease. Results were called by telephone at the time of interpretation on 10/27/2022 at 10:40 pm to provider Vonita Moss , who verbally acknowledged these results. Electronically Signed   By: Rise Mu M.D.   On: 10/27/2022 22:42    PHYSICAL EXAM  Temp:  [97.5 F (36.4 C)-98.6 F (37 C)] 98.1 F (36.7 C) (05/18 1524) Pulse Rate:  [70-84] 78 (05/18 1524)  Resp:  [11-20] 17 (05/18 1524) BP:  (131-169)/(77-114) 131/78 (05/18 1524) SpO2:  [93 %-100 %] 100 % (05/18 1524) Weight:  [63.2 kg] 63.2 kg (05/18 0135)  General - Well nourished, well developed, in no apparent distress. Cardiovascular - Regular rhythm and rate.  Mental Status -  Level of arousal and orientation to time, place, and person were intact. Language including expression, naming, repetition, comprehension was assessed and found intact. Attention span and concentration were normal. Recent and remote memory were intact. Fund of Knowledge was assessed and was intact.  Cranial Nerves II - XII - II - Visual field intact OU.  Subjectively states blurry vision III, IV, VI -right INO, left eye with nystagmus on leftward gaze V - Facial sensation intact bilaterally. VII -left facial droop (old) VIII - Hearing & vestibular intact bilaterally. X - Palate elevates symmetrically. XI - Chin turning & shoulder shrug intact bilaterally. XII - Tongue protrusion intact.  Motor Strength -left arm and leg 4/5, right arm and right leg 5/5 Motor Tone - Muscle tone was assessed at the neck and appendages and was normal.  Sensory - Light touch, temperature/pinprick were assessed and were symmetrical.    Coordination -mild ataxia in left arm  Gait and Station - deferred.  ASSESSMENT/PLAN [name] is a 52 y.o. 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 She reports that Wednesday 10/25/22 she was doing well - home health nurse checked BP, glucose both well controlled. Early Thursday 10/26/22 patient went to [facility] ED due to N/V and diarrhea. She was diagnosed with a stomach virus and went home. Friday, 10/27/22 she missed an appointment with her retinologist because her ride was late. She went shopping with her daughter and grandchildren and was doing well. Driving at she reports  that precisely at 6:20 PM she developed double vision w/o headache   Stroke: Multiple acute subacute punctate right temporal lobe ischemic infarct and left corona radiata Etiology: Small vessel disease Code Stroke  CT head No acute abnormality. Atrophy. ASPECTS 10.    MRI acute punctate right temporal lobe ischemic infarct, 2 small subacute infarcts in left corona radiata MRA negative 2D Echo 08/2022-EF 65 to 70%.  Moderate asymmetric left ventricular hypertrophy.-Hypertrophied papillary muscles  LDL 35 HgbA1c 6.3 VTE prophylaxis -heparin subcu    Diet   Diet heart healthy/carb modified Room service appropriate? No; Fluid consistency: Thin   Aspirin 81 mg twice daily and Brilinta 90 mg twice daily prior to admission, now on aspirin 81 mg twice daily and Brilinta 90 mg twice daily.  Therapy recommendations: Pending Disposition: Pending  Hx stroke/TIA 08/2015 CSF neg, protein 58 07/2017 MRI showed left pontine infarct, EF 65 to 70%, carotid Doppler and MRA negative.  LDL 43, A1c 12.5, patient put on DAPT and Lipitor 40. 08/2017 Loop recorder placed, no A. fib found 02/27/2019 woke up with complete vision loss, gradually vision resumed OD, still has better vision OS.  MRI at that time showed 3 mm acute to early subacute infarct in the dorsal midline of the pons negative MRI head and neck. 07/2019 loop recorder explanted 05/01/2020 MRI showed new T2 hyperintense lesion of the right frontal white matter likely reflect an area of late subacute or chronic infarction. 06/19/2020 Subcentimeter likely subacute infarct at the left lateral aspect of the pons.  MRA head unremarkable, carotid Doppler unremarkable, EF 70 to 75%.  LDL 63, A1c 6.2.  Put on aspirin and Brilinta for 1 months and then back to aspirin and the Plavix.  Continue Lipitor 80. 11/10/2021 - MRI showed possible small right cerebral peduncle infarcts.  MRA negative.  Carotid Doppler unremarkable.  EF 60 to 65%, LDL 34, A1c 6.5, discharged with  ASA and brilinta 90 twice daily.  Outpatient follow-up with [facility] neurology, continued on aspirin and Brilinta 60 twice daily. 06/20/2022- TIA with confusion, EEG negative, negative for stroke 08/22/2022-right hemispheric TIA from small vessel disease.  Silent left subcortical lacunar infarct.  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 EEG 01/2022 normal Monitor  Hypertension Home meds: Amlodipine 10 mg, Coreg 12.5 mg, Stable Permissive hypertension (OK if < 220/120) but gradually normalize in 5-7 days Long-term BP goal normotensive  Hyperlipidemia Home meds: Atorvastatin 80 mg, resumed in hospital LDL 35, goal < 70 Continue statin at discharge  Diabetes type II Controlled Home meds: Ozempic HgbA1c 6.3, goal < 7.0 CBGs Recent Labs    10/27/22 2149  GLUCAP 148*    SSI  Other Stroke Risk Factors Hx stroke/TIA Obstructive sleep apnea, on CPAP at home   Other Active Problems GERD Asthma CKD PAD/PVD  Hospital day # 0 Gevena Mart DNP, ACNPC-AG  [facility]  ATTENDING ATTESTATION:  Dr. Viviann Spare evaluated pt independently, reviewed imaging, chart, labs. Discussed and formulated plan with the Resident/APP. Changes were made to the note where appropriate. Please see APP/resident note above for details.     Kaci Dillie,MD    To contact Stroke Continuity provider, please refer to [email or web address]. After hours, contact General Neurology

## 2022-10-28 NOTE — Assessment & Plan Note (Signed)
Patient reports using CPAP at night. She tolerates this well.  Plan Continue CPAP-respiratory to follow

## 2022-10-28 NOTE — Assessment & Plan Note (Signed)
Stable with no active complaints.  Plan Continue PPI

## 2022-10-28 NOTE — Assessment & Plan Note (Signed)
Patient with diploplia. 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 Patient with h/o small vessel disease brain as source of previous strokes with no evidence on prior vascular studies, most recently 08/22/22, of large vessel occlusion. She has been adherent with taking ASA and Brilinta. She reports no change in motor strength, coordination, sensation.  Plan Repeat MR imaging pending  Cancelled echo - last study 08/23/22 with no source of embolism  Risk factors well controlled with excellent lipid management as of 08/23/22, diabetes management as of 08/22/22.  Neurology to follow.

## 2022-10-28 NOTE — Assessment & Plan Note (Signed)
Patient follows with nephrology. Her creatinine is stable at this time.  Plan Continue home medications  Avoid nephrotoxic medications, dyes

## 2022-10-28 NOTE — H&P (Signed)
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 She reports that Wednesday 10/25/22 she was doing well - home health nurse checked BP, glucose both well controlled. Early Thursday 10/26/22 patient went to [facility] ED due to N/V and diarrhea. She was diagnosed with a stomach virus and went home. Friday, 10/27/22 she missed an appointment with her retinologist because her ride was late. She went shopping with her daughter and grandchildren and was doing well. Driving at she reports that precisely at 6:20 PM she developed double vision w/o headache. She denies any other symptoms. She had been diagnosed with proptosis by her ophthalmologist but otherwise was doing well.  She presented to [facility] for evaluation. Head CT was negative. Teleneuro recommended admission to [facility] for stroke workup.   Chart review: MRA head/neck 06/21/22 no acute findings, no LVO, positive ischemic changes                       Echo 08/23/22  EF 65-70% asymmetric LVH, no source of embolism                       TEE  08/24/17 - negative for source of embolism                       CTA head/neck 08/23/22 - no LVO, no stenosis carotids                       Lipid panel 08/23/22  Chol 135, HDL 7, LDL 35, Tgy 43                       A1C 08/22/22  6.3%   ED Course: T 97.9 160/88  79  16. Exam by teleneuro and EDP no distinct focal findings. Lab: K 3.4  glucose 137 Cr 2.86 (baseline) Hgb 10.2 (baseline) UA negative, UDS negative. Neuro recommended transfer to [facility] for further workup for CVA.[facility] accepted transfer.  Review of Systems:  Review of Systems   Constitutional:  Negative for chills, fever, malaise/fatigue and weight loss.  HENT: Negative.    Eyes:  Positive for double vision. Negative for blurred vision, photophobia, pain and discharge.  Respiratory: Negative.    Cardiovascular: Negative.   Gastrointestinal:  Negative for abdominal pain, nausea and vomiting.       Recent bout of N/V/D attributed to viral gastroeneteritis at [facility] -ED evaluation 10/25/22  Genitourinary: Negative.   Musculoskeletal: Negative.   Skin: Negative.   Neurological:  Negative for dizziness, sensory change, focal weakness and headaches.  Endo/Heme/Allergies: Negative.   Psychiatric/Behavioral: Negative.      Past Medical History:  Diagnosis Date   Anemia    severe   Anxiety    Asthma    CKD (chronic kidney disease)    stage IV   DDD (degenerative disc disease), cervical    Depression    Diabetes mellitus (HCC)    Dislocated shoulder    right   GERD (gastroesophageal reflux disease)  Headache    migraines (about once a month)   History of kidney stones    Hypertension    Neuropathy    Peripheral vascular disease (HCC)    Pneumonia    Stroke [facility])     Past Surgical History:  Procedure Laterality Date   ADENOIDECTOMY     APPENDECTOMY     CERVICAL SPINE SURGERY  06/2012   C5-C6   EYE SURGERY     left eye surgery    FOOT SURGERY     KENALOG INJECTION Left 03/10/2021   Procedure: Intravitreal Avastin Injection;  Surgeon: Stephannie Li, MD;  Location: [facility] OR;  Service: Ophthalmology;  Laterality: Left;   LOOP RECORDER INSERTION N/A 08/27/2017   Procedure: LOOP RECORDER INSERTION;  Surgeon: Thurmon Fair, MD;  Location: [facility] INVASIVE CV LAB;  Service: Cardiovascular;  Laterality: N/A;   MEMBRANE PEEL Left 03/10/2021   Procedure: MEMBRANE PEEL;  Surgeon: Stephannie Li, MD;  Location: [facility] OR;  Service: Ophthalmology;  Laterality: Left;   PARS PLANA VITRECTOMY Left 03/10/2021   Procedure: TWENTY-FIVE GAUGE PARS PLANA VITRECTOMY;  Surgeon:  Stephannie Li, MD;  Location: [facility] OR;  Service: Ophthalmology;  Laterality: Left;   PHOTOCOAGULATION WITH LASER Left 03/10/2021   Procedure: PHOTOCOAGULATION WITH LASER;  Surgeon: Stephannie Li, MD;  Location: [facility] OR;  Service: Ophthalmology;  Laterality: Left;   SHOULDER SURGERY     TEE WITHOUT CARDIOVERSION N/A 08/27/2017   Procedure: TRANSESOPHAGEAL ECHOCARDIOGRAM (TEE);  Surgeon: Thurmon Fair, MD;  Location: [facility] ENDOSCOPY;  Service: Cardiovascular;  Laterality: N/A;   TONSILLECTOMY      Soc Hx - was married and divorced. Two daughters, 1 son, several grandchildren. 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 Lives alone, but daughter will be with her during summer break. She has had HH, PT, OT on-going.    reports that she has never smoked. She has never used smokeless tobacco. She reports that she does not drink alcohol and does not use drugs.  Allergies  Allergen Reactions   Lisinopril Swelling and Other (See Comments)    Facial swelling  angioedema   Penicillins Hives    Has patient had a PCN reaction causing immediate rash, facial/tongue/throat swelling, SOB or lightheadedness with hypotension: yes Has patient had a PCN reaction causing severe rash involving mucus membranes or skin necrosis: No Has patient had a PCN reaction that required hospitalization No Has patient had a PCN reaction occurring within the last 10 years: No If all of the above answers are "NO", then may proceed wit Rash on buttock that makes sitting very difficult   Quetiapine Other (See Comments)    Unknown    Gabapentin Swelling and Other (See Comments)    "I thought I was having another stroke." dizziness    Family History  Problem Relation Age of Onset   Vascular Disease Mother    CAD Mother    Heart failure Mother    Heart disease Other    Cancer Other        colon, stomach, pancreatic, lung   Diabetes Other    Breast cancer Sister 30    Seizures Maternal Uncle     Prior to Admission medications   Medication Sig Start Date End Date Taking? Authorizing Provider  albuterol (PROAIR HFA) 108 (90 Base) MCG/ACT inhaler INHALE 1 OR 2 PUFFS INTO THE LUNGS EVERY 6 HOURS AS NEEDED FOR WHEEZING OR SHORTNESS OF BREATH Patient taking differently: Inhale 2 puffs into  the lungs as needed for shortness of breath or wheezing. 04/12/18   Angiulli, Mcarthur Rossetti, PA-C  amLODipine (NORVASC) 10 MG tablet Take 1 tablet (10 mg total) by mouth daily. 10/05/22   Alver Sorrow, NP  aspirin EC 81 MG tablet Take 81 mg by mouth 2 (two) times daily. Swallow whole.    [provider]  atorvastatin (LIPITOR) 80 MG tablet Take 1 tablet (80 mg total) by mouth at bedtime. 10/05/22   Alver Sorrow, NP  carvedilol (COREG) 12.5 MG tablet Take 1 tablet (12.5 mg total) by mouth 2 (two) times daily with a meal. 10/12/22   Alver Sorrow, NP  Elastic Bandages & Supports (MEDICAL COMPRESSION STOCKINGS) MISC 1 Units by Does not apply route daily. Wear 15-25mmHg knee high compression stockings during daytime hours. 10/05/22   Alver Sorrow, NP  fluticasone (FLONASE) 50 MCG/ACT nasal spray Place 2 sprays into both nostrils daily. 05/01/22   [provider]  glucose blood (CONTOUR NEXT TEST) test strip 1 each by Other route 2 (two) times daily. And lancets 2/day Patient taking differently: 1 each by Other route 2 (two) times daily. 04/12/18   Angiulli, Mcarthur Rossetti, PA-C  montelukast (SINGULAIR) 10 MG tablet Take 10 mg by mouth at bedtime.    [provider]  ondansetron (ZOFRAN-ODT) 4 MG disintegrating tablet Take 1 tablet (4 mg total) by mouth every 8 (eight) hours as needed for nausea. 10/26/22   Garlon Hatchet, PA-C  OZEMPIC, 0.25 OR 0.5 MG/DOSE, 2 MG/3ML SOPN Inject 0.5 mg into the skin See admin instructions. Inject 0.5 mg subcutaneous every 10 days 04/14/22   [provider]  pantoprazole (PROTONIX) 40 MG tablet Take 1 tablet (40 mg  total) by mouth daily. 12/30/21   Love, Evlyn Kanner, PA-C  sertraline (ZOLOFT) 50 MG tablet Take 50 mg by mouth at bedtime. 09/18/22   [provider]  ticagrelor (BRILINTA) 90 MG TABS tablet Take 90 mg by mouth 2 (two) times daily.    [provider]    Physical Exam: Vitals:   10/28/22 0030 10/28/22 0135 10/28/22 0538 10/28/22 0834  BP: (!) 151/80 (!) 152/114 (!) 160/88 (!) 154/84  Pulse: 73 79 79 70  Resp: 17 16  18   Temp:  98.5 F (36.9 C) 97.9 F (36.6 C) 98 F (36.7 C)  TempSrc:  Oral Oral Oral  SpO2: 100% 100% 100% 100%  Weight:  63.2 kg    Height:  5\' 2"  (1.575 m)      Physical Exam Vitals and nursing note reviewed.  Constitutional:      General: She is not in acute distress.    Appearance: Normal appearance. She is normal weight.  HENT:     Head: Normocephalic and atraumatic.     Mouth/Throat:     Mouth: Mucous membranes are moist.     Pharynx: Oropharynx is clear. No oropharyngeal exudate.  Eyes:     General: No scleral icterus.    Conjunctiva/sclera: Conjunctivae normal.     Pupils: Pupils are equal, round, and reactive to light.     Comments: EOM - medial rectus OD deficit  Neck:     Vascular: No carotid bruit.  Cardiovascular:     Rate and Rhythm: Normal rate and regular rhythm.     Pulses: Normal pulses.     Heart sounds: Normal heart sounds.  Pulmonary:     Effort: Pulmonary effort is normal.     Breath sounds: Normal breath sounds.  Abdominal:     General: Bowel sounds are normal.     Palpations: Abdomen is soft.  Musculoskeletal:        General: No swelling or deformity.     Cervical back: Normal range of motion and neck supple.     Right lower leg: No edema.     Left lower leg: No edema.  Skin:    General: Skin is warm and dry.     Coloration: Skin is not jaundiced.     Findings: No lesion.  Neurological:     Mental Status: She is alert and oriented to person, place, and time.     Cranial Nerves: Cranial nerve deficit present.      Comments: CN - III - decrease adduction OD         Very subtle right facial asymmetry - old finding  MS - nl movement, nl grip,  Cerebellar - poor balance, able to transfer chair to bed using walker  Psychiatric:        Mood and Affect: Mood normal.        Behavior: Behavior normal.      Labs on Admission: I have personally reviewed following labs and imaging studies  CBC: Recent Labs  Lab 10/26/22 0401 10/27/22 2158  WBC 11.8* 7.1  NEUTROABS 10.1* 4.1  HGB 9.8* 10.2*  HCT 30.5* 30.6*  MCV 90.5 88.2  PLT 234 226   Basic Metabolic Panel: Recent Labs  Lab 10/26/22 0401 10/27/22 2158  NA 141 142  K 3.5 3.4*  CL 114* 111  CO2 17* 21*  GLUCOSE 169* 137*  BUN 38* 34*  CREATININE 2.64* 2.86*  CALCIUM 9.4 9.4   GFR: Estimated Creatinine Clearance: 20.8 mL/min (A) (by C-G formula based on SCr of 2.86 mg/dL (H)). Liver Function Tests: Recent Labs  Lab 10/26/22 0401 10/27/22 2158  AST 19 16  ALT 21 15  ALKPHOS 47 45  BILITOT 0.6 0.3  PROT 8.1 7.4  ALBUMIN 4.2 4.3   Recent Labs  Lab 10/26/22 0401  LIPASE 33   No results for input(s): "AMMONIA" in the last 168 hours. Coagulation Profile: Recent Labs  Lab 10/27/22 2158  INR 1.0   Cardiac Enzymes: No results for input(s): "CKTOTAL", "CKMB", "CKMBINDEX", "TROPONINI" in the last 168 hours. BNP (last 3 results) No results for input(s): "PROBNP" in the last 8760 hours. HbA1C: No results for input(s): "HGBA1C" in the last 72 hours. CBG: Recent Labs  Lab 10/27/22 2149  GLUCAP 148*   Lipid Profile: No results for input(s): "CHOL", "HDL", "LDLCALC", "TRIG", "CHOLHDL", "LDLDIRECT" in the last 72 hours. Thyroid Function Tests: No results for input(s): "TSH", "T4TOTAL", "FREET4", "T3FREE", "THYROIDAB" in the last 72 hours. Anemia Panel: No results for input(s): "VITAMINB12", "FOLATE", "FERRITIN", "TIBC", "IRON", "RETICCTPCT" in the last 72 hours. Urine analysis:    Component Value Date/Time    COLORURINE YELLOW 10/27/2022 2357   APPEARANCEUR CLEAR 10/27/2022 2357   LABSPEC 1.014 10/27/2022 2357   PHURINE 5.5 10/27/2022 2357   GLUCOSEU NEGATIVE 10/27/2022 2357   HGBUR TRACE (A) 10/27/2022 2357   BILIRUBINUR NEGATIVE 10/27/2022 2357   BILIRUBINUR neg 07/15/2018 1128   KETONESUR NEGATIVE 10/27/2022 2357   PROTEINUR 100 (A) 10/27/2022 2357   UROBILINOGEN 0.2 07/15/2018 1128   UROBILINOGEN 0.2 04/23/2017 0920   NITRITE NEGATIVE 10/27/2022 2357   LEUKOCYTESUR NEGATIVE 10/27/2022 2357    Radiological Exams on Admission: I have personally reviewed images MR ANGIO HEAD WO CONTRAST  Result Date: 10/28/2022 CLINICAL DATA:  52 year old  female code stroke presentation yesterday. Advanced acute on chronic small vessel disease on MRI. EXAM: MRA HEAD WITHOUT CONTRAST TECHNIQUE: Angiographic images of the Circle of Willis were acquired using MRA technique without intravenous contrast. COMPARISON:  Brain MRI today. CTA head and neck 08/22/2022. Intracranial MRA 06/21/2022, and earlier. FINDINGS: Mildly motion degraded today. Anterior circulation: Antegrade flow in both ICA siphons appears stable from last year. No siphon stenosis. Ophthalmic and left posterior communicating artery origins appear normal. Patent carotid termini. MCA and ACA origins appear normal. Diminutive anterior communicating artery. Visible ACA branches are stable and within normal limits. MCA M1 segments and bi/trifurcations remain patent without stenosis. Visible bilateral MCA branches are stable and within normal limits. Posterior circulation: Antegrade flow in the posterior circulation appears stable from last year. Dominant left vertebral artery. Both PICA origins remain patent. No convincing distal vertebral or basilar stenosis. SCA and PCA origins remain normal. Left posterior communicating artery is present. Bilateral PCA branches are within normal limits. Anatomic variants: Dominant left vertebral artery. Other: Numerous  susceptibility foci in the brainstem and deep gray nuclei in keeping with the known chronic parenchymal microhemorrhages. Brain MRI today reported separately. IMPRESSION: Intracranial MRA remains negative, stable compared to June of last year. Electronically Signed   By: Odessa Fleming M.D.   On: 10/28/2022 08:07   MR BRAIN WO CONTRAST  Result Date: 10/28/2022 CLINICAL DATA:  52 year old female code stroke presentation yesterday. History of small vessel disease. EXAM: MRI HEAD WITHOUT CONTRAST TECHNIQUE: Multiplanar, multiecho pulse sequences of the brain and surrounding structures were obtained without intravenous contrast. COMPARISON:  Head CT yesterday.  Brain MRI 08/22/2022. FINDINGS: Brain: Small new areas of abnormal diffusion in the posterior left corona radiata (series 5, image 83), tracking toward the posterior left lentiform. And also along the nearby lateral margin of the left splenium corpus callosum (series 5, image 84). These areas are not conspicuous on ADC and could be subacute. Additionally, there is a small round focus of restricted diffusion in the right temporal lobe periventricular white matter anteriorly series 5, image 69. This is about 5 mm. Faint T2 and FLAIR hyperintensity here. No other diffusion restriction. Chronic severe signal abnormality in the brainstem and cerebellar peduncles which may in part be Wallerian degeneration. Multifocal chronic bilateral deep gray nuclei lacunar infarcts. Extensive cerebral white matter T2 and FLAIR hyperintensity including some areas of corpus callosum encephalomalacia and lesions oriented perpendicular to the lateral ventricles. But there is no cystic encephalomalacia of the corpus callosum. Outside of the diffusion findings described above signal appears stable throughout the brain. Moderate number of chronic microhemorrhages are scattered in the bilateral deep gray nuclei, and especially throughout the brainstem. Occasional hemispheric chronic  microhemorrhages also. No midline shift, mass effect, evidence of mass lesion, ventriculomegaly, extra-axial collection or acute intracranial hemorrhage. Cervicomedullary junction and pituitary are within normal limits. Vascular: Major intracranial vascular flow voids are stable. MRA today is reported separately. Skull and upper cervical spine: Negative visible cervical spine. Visualized bone marrow signal is within normal limits. Sinuses/Orbits: Stable, negative. Other: Stable mild mastoid effusions. Benign right forehead scalp lipoma. IMPRESSION: 1. Positive for punctate acute right temporal lobe white matter lacunar infarct. And two small subacute appearing white matter infarcts in the Left corona radiata, near the left splenium. No associated hemorrhage or mass effect. 2. Underlying chronic severe small vessel disease, including numerous chronic microhemorrhages in the brainstem with Wallerian degeneration. Conspicuous periventricular white matter involvement, although chronic demyelinating disease is felt less likely. 3. MRA  today is reported separately. Electronically Signed   By: Odessa Fleming M.D.   On: 10/28/2022 08:00   CT HEAD CODE STROKE WO CONTRAST  Result Date: 10/27/2022 CLINICAL DATA:  Code stroke. Initial evaluation for neuro deficit, stroke. EXAM: CT HEAD WITHOUT CONTRAST TECHNIQUE: Contiguous axial images were obtained from the base of the skull through the vertex without intravenous contrast. RADIATION DOSE REDUCTION: This exam was performed according to the departmental dose-optimization program which includes automated exposure control, adjustment of the mA and/or kV according to patient size and/or use of iterative reconstruction technique. COMPARISON:  Prior study from 08/22/2022. FINDINGS: Brain: Mild cerebral and cerebellar atrophy for age. Moderate chronic microvascular ischemic disease with multiple remote lacunar infarcts about the deep gray nuclei. No acute intracranial hemorrhage. No  acute large vessel territory infarct. No mass lesion or midline shift. No hydrocephalus or extra-axial fluid collection. Vascular: No abnormal hyperdense vessel. Skull: Scalp soft tissues and calvarium within normal limits. Sinuses/Orbits: Globes and orbital soft tissues demonstrate no acute finding. Paranasal sinuses mastoid air cells are clear. Other: None. ASPECTS [facility] Stroke Program Early CT Score) - Ganglionic level infarction (caudate, lentiform nuclei, internal capsule, insula, M1-M3 cortex): 7 - Supraganglionic infarction (M4-M6 cortex): 3 Total score (0-10 with 10 being normal): 10 IMPRESSION: 1. No acute intracranial abnormality. 2. ASPECTS is 10. 3. Mildly advanced cerebral and cerebellar atrophy with moderate chronic microvascular ischemic disease. Results were called by telephone at the time of interpretation on 10/27/2022 at 10:40 pm to provider Vonita Moss , who verbally acknowledged these results. Electronically Signed   By: Rise Mu M.D.   On: 10/27/2022 22:42    EKG: I have personally reviewed EKG: Sinus rhythm, old anterior injury, no acute changes  Assessment/Plan Active Problems:   OSA (obstructive sleep apnea)   Asthma, mild intermittent   Hypertension   Chronic kidney disease (CKD), stage IV (severe) (HCC)   Gastroesophageal reflux disease without esophagitis   Type 2 diabetes mellitus with chronic kidney disease, without long-term current use of insulin (HCC)   Vision abnormalities    Assessment and Plan: OSA (obstructive sleep apnea) Patient reports using CPAP at night. She tolerates this well.  Plan Continue CPAP-respiratory to follow  Hypertension BP mildly elevated at admission. By her report BP has been well controlled at home. At last Cardiology OV she was advised to increase coreg to 25 mg BID but she was concerned for possible hypotension and continued at previous dose of coreg 12.5 mg BID and continued her other medications.  Plan Continue  home regimen  Hydralazine prn ordered by neuro/receiving MD  Asthma, mild intermittent Stable w/o respiratory symptoms.  Plan Continue home regimen  Chronic kidney disease (CKD), stage IV (severe) (HCC) Patient follows with nephrology. Her creatinine is stable at this time.  Plan Continue home medications  Avoid nephrotoxic medications, dyes  Type 2 diabetes mellitus with chronic kidney disease, without long-term current use of insulin (HCC) Lat A1C 08/22/22 6.3%. CBGs at home have been well controlled.  Plan Continue home regimen  Gastroesophageal reflux disease without esophagitis Stable with no active complaints.  Plan Continue PPI  Vision abnormalities Patient with diploplia. 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 Patient with h/o small vessel disease brain as source of previous strokes with no evidence on prior vascular studies, most recently 08/22/22, of large vessel occlusion. She has been adherent with taking ASA and Brilinta. She reports  no change in motor strength, coordination, sensation.  Plan Repeat MR imaging pending  Cancelled echo - last study 08/23/22 with no source of embolism  Risk factors well controlled with excellent lipid management as of 08/23/22, diabetes management as of 08/22/22.  Neurology to follow.        DVT prophylaxis: SQ Heparin Code Status: Full Code Family Communication: spoke with Lucillie Garfinkel, daughter.  Disposition Plan: home when stable  Consults called: Neurology - Dr. Tyrell Antonio did initial teleneuro eval  Admission status: Inpatient, Telemetry bed   Illene Regulus, MD [facility] 10/28/2022, 8:38 AM

## 2022-10-29 DIAGNOSIS — I639 Cerebral infarction, unspecified: Secondary | ICD-10-CM | POA: Diagnosis not present

## 2022-10-29 DIAGNOSIS — G4733 Obstructive sleep apnea (adult) (pediatric): Secondary | ICD-10-CM | POA: Diagnosis not present

## 2022-10-29 DIAGNOSIS — E1122 Type 2 diabetes mellitus with diabetic chronic kidney disease: Secondary | ICD-10-CM

## 2022-10-29 DIAGNOSIS — J4520 Mild intermittent asthma, uncomplicated: Secondary | ICD-10-CM | POA: Diagnosis not present

## 2022-10-29 DIAGNOSIS — I10 Essential (primary) hypertension: Secondary | ICD-10-CM

## 2022-10-29 DIAGNOSIS — K219 Gastro-esophageal reflux disease without esophagitis: Secondary | ICD-10-CM

## 2022-10-29 DIAGNOSIS — N184 Chronic kidney disease, stage 4 (severe): Secondary | ICD-10-CM

## 2022-10-29 LAB — GLUCOSE, CAPILLARY: Glucose-Capillary: 166 mg/dL — ABNORMAL HIGH (ref 70–99)

## 2022-10-29 MED ORDER — INSULIN ASPART 100 UNIT/ML IJ SOLN
0.0000 [IU] | Freq: Three times a day (TID) | INTRAMUSCULAR | Status: DC
  Administered 2022-10-30 – 2022-10-31 (×2): 1 [IU] via SUBCUTANEOUS

## 2022-10-29 NOTE — Care Management Obs Status (Signed)
MEDICARE OBSERVATION STATUS NOTIFICATION   Patient Details  Name: Christina Orozco MRN: [id number] Date of Birth: [birth year]   Medicare Observation Status Notification Given:  Yes    Lawerance Sabal, RN 10/29/2022, 9:14 AM

## 2022-10-29 NOTE — Plan of Care (Signed)
  Problem: Education: Goal: Knowledge of General Education information will improve Description: Including pain rating scale, medication(s)/side effects and non-pharmacologic comfort measures Outcome: Progressing   Problem: Health Behavior/Discharge Planning: Goal: Ability to manage health-related needs will improve Outcome: Progressing   Problem: Clinical Measurements: Goal: Ability to maintain clinical measurements within normal limits will improve Outcome: Progressing Goal: Will remain free from infection Outcome: Progressing Goal: Diagnostic test results will improve Outcome: Progressing Goal: Respiratory complications will improve Outcome: Progressing Goal: Cardiovascular complication will be avoided Outcome: Progressing   Problem: Activity: Goal: Risk for activity intolerance will decrease Outcome: Progressing   Problem: Nutrition: Goal: Adequate nutrition will be maintained Outcome: Progressing   Problem: Coping: Goal: Level of anxiety will decrease Outcome: Progressing   Problem: Elimination: Goal: Will not experience complications related to bowel motility Outcome: Progressing Goal: Will not experience complications related to urinary retention Outcome: Progressing   Problem: Pain Managment: Goal: General experience of comfort will improve Outcome: Progressing   Problem: Safety: Goal: Ability to remain free from injury will improve Outcome: Progressing   Problem: Skin Integrity: Goal: Risk for impaired skin integrity will decrease Outcome: Progressing   Problem: Education: Goal: Knowledge of disease or condition will improve Outcome: Progressing Goal: Knowledge of secondary prevention will improve (MUST DOCUMENT ALL) Outcome: Progressing Goal: Knowledge of patient specific risk factors will improve (Mark N/A or DELETE if not current risk factor) Outcome: Progressing   Problem: Ischemic Stroke/TIA Tissue Perfusion: Goal: Complications of ischemic  stroke/TIA will be minimized Outcome: Progressing   Problem: Coping: Goal: Will verbalize positive feelings about self Outcome: Progressing Goal: Will identify appropriate support needs Outcome: Progressing   Problem: Health Behavior/Discharge Planning: Goal: Ability to manage health-related needs will improve Outcome: Progressing Goal: Goals will be collaboratively established with patient/family Outcome: Progressing   Problem: Self-Care: Goal: Ability to participate in self-care as condition permits will improve Outcome: Progressing Goal: Verbalization of feelings and concerns over difficulty with self-care will improve Outcome: Progressing Goal: Ability to communicate needs accurately will improve Outcome: Progressing   Problem: Nutrition: Goal: Risk of aspiration will decrease Outcome: Progressing Goal: Dietary intake will improve Outcome: Progressing   

## 2022-10-29 NOTE — Evaluation (Signed)
Clinical/Bedside Swallow Evaluation Patient Details  Name: Christina Orozco MRN: [id number] Date of Birth: July 25, 1970  Today's Date: 10/29/2022 Time: SLP Start Time (ACUTE ONLY): 1030 SLP Stop Time (ACUTE ONLY): 1045 SLP Time Calculation (min) (ACUTE ONLY): 15 min  Past Medical History:  Past Medical History:  Diagnosis Date   Anemia    severe   Anxiety    Asthma    CKD (chronic kidney disease)    stage IV   DDD (degenerative disc disease), cervical    Depression    Diabetes mellitus (HCC)    Dislocated shoulder    right   GERD (gastroesophageal reflux disease)    Headache    migraines (about once a month)   History of kidney stones    Hypertension    Neuropathy    Peripheral vascular disease (HCC)    Pneumonia    Stroke [facility])    Past Surgical History:  Past Surgical History:  Procedure Laterality Date   ADENOIDECTOMY     APPENDECTOMY     CERVICAL SPINE SURGERY  06/2012   C5-C6   EYE SURGERY     left eye surgery    FOOT SURGERY     KENALOG INJECTION Left 03/10/2021   Procedure: Intravitreal Avastin Injection;  Surgeon: Stephannie Li, MD;  Location: [facility] OR;  Service: Ophthalmology;  Laterality: Left;   LOOP RECORDER INSERTION N/A 08/27/2017   Procedure: LOOP RECORDER INSERTION;  Surgeon: Thurmon Fair, MD;  Location: [facility] INVASIVE CV LAB;  Service: Cardiovascular;  Laterality: N/A;   MEMBRANE PEEL Left 03/10/2021   Procedure: MEMBRANE PEEL;  Surgeon: Stephannie Li, MD;  Location: [facility] OR;  Service: Ophthalmology;  Laterality: Left;   PARS PLANA VITRECTOMY Left 03/10/2021   Procedure: TWENTY-FIVE GAUGE PARS PLANA VITRECTOMY;  Surgeon: Stephannie Li, MD;  Location: [facility] OR;  Service: Ophthalmology;  Laterality: Left;   PHOTOCOAGULATION WITH LASER Left 03/10/2021   Procedure: PHOTOCOAGULATION WITH LASER;  Surgeon: Stephannie Li, MD;  Location: [facility] OR;  Service: Ophthalmology;  Laterality: Left;   SHOULDER SURGERY     TEE WITHOUT CARDIOVERSION N/A 08/27/2017   Procedure:  TRANSESOPHAGEAL ECHOCARDIOGRAM (TEE);  Surgeon: Thurmon Fair, MD;  Location: [facility] ENDOSCOPY;  Service: Cardiovascular;  Laterality: N/A;   TONSILLECTOMY     HPI:  Christina Orozco is a 52 yr old lady with 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 She reports that Wednesday 10/25/22 she was doing well - home health nurse checked BP, glucose both well controlled. Early Thursday 10/26/22 patient went to [facility] ED due to N/V and diarrhea. She was diagnosed with a stomach virus and went home. Friday, 10/27/22 she missed an appointment with her retinologist because her ride was late. She went shopping with her daughter and grandchildren and was doing well. Driving at she reports that precisely at 6:20 PM she developed double vision w/o headache. She presented to [facility] for evaluation. Head CT was negative. Teleneuro recommended admission to [facility] for stroke workup.  HEAD MRA :Positive for punctate acute right temporal lobe white matter  lacunar infarct. And two small subacute appearing white matter  infarcts in the Left corona radiata, near the left splenium. No  associated hemorrhage or mass effect.  2. 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 Conspicuous periventricular white matter involvement,  although chronic demyelinating disease is felt less likely.    Assessment / Plan / Recommendation  Clinical Impression  Christina Orozco presents with baseline dysphagia from multiple (9) prior strokes and suspected esophageal deficits. She reports feeling her dysphagia is worsened since coming into the hospital this time. RN reports she had coughing during med pass this morning. Pt confirms coughing "any time I eat." She was seen with some of breakfast meal tray and thin liquids without coughing, but a mild throat clear was noted intermittently. Oral mech exam reveals R sided weakness  and she states she is having difficulty chewing. She declined melon this morning on tray d/t difficulty with mastication. She describes this as "my chewing feels uneven like my teeth aren't meeting right."  No oral residue/pocketing observed. Prior MBSS in December showed an abnormal appearance to the base of tongue and recommended ENT. She reports she went to ENT, but it was for her ears. Esophagram is ordered in system, but was never completed. She would likely benefit from repeat MBSS to assess her increased dysphagia this hospitalization as well as completion of esophagram if able while in house.    SLP Visit Diagnosis: Dysphagia, oropharyngeal phase (R13.12)    Aspiration Risk  Mild aspiration risk    Diet Recommendation Thin liquid;Regular   Liquid Administration via: Cup;Straw Medication Administration: Whole meds with liquid Supervision: Patient able to self feed Compensations: Small sips/bites;Follow solids with liquid Postural Changes: Seated upright at 90 degrees;Remain upright for at least 30 minutes after po intake    Other  Recommendations Recommended Consults: Consider GI evaluation;Consider ENT evaluation Oral Care Recommendations: Oral care BID    Recommendations for follow up therapy are one component of a multi-disciplinary discharge planning process, led by the attending physician.  Recommendations may be updated based on patient status, additional functional criteria and insurance authorization.  Follow up Recommendations Home health SLP      Functional Status Assessment Patient has had a recent decline in their functional status and demonstrates the ability to make significant improvements in function in a reasonable and predictable amount of time.  Frequency and Duration min 1 x/week  1 week       Prognosis Prognosis for improved oropharyngeal function: Good      Swallow Study   General Date of Onset: 10/27/22 HPI: Christina Orozco is a 52 yr old lady with hx of  9 CVAs last 08/22/22, CKD 4, PAD, DM, HTN, asthma, and OSA presented to [facility] with diplopia. She reports that Wednesday 10/25/22 she was doing well - home health nurse checked BP, glucose both well controlled. Early Thursday 10/26/22 patient went to [facility] ED due to N/V and diarrhea. She was diagnosed with a stomach virus and went home. Friday, 10/27/22 she missed an appointment with her retinologist because her ride was late. She went shopping with her daughter and grandchildren and was doing well. Driving at she reports that precisely at 6:20 PM she developed double vision w/o headache. She presented to [facility] for evaluation. Head CT was negative. Teleneuro recommended admission to [facility] for stroke workup.  HEAD MRA :Positive for punctate acute right temporal lobe white matter  lacunar infarct. And two small subacute appearing white matter  infarcts in the Left corona radiata, near the left splenium. No  associated hemorrhage or mass effect.  2. 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 Conspicuous periventricular white matter involvement,  although chronic demyelinating disease is felt less likely. Type of Study: Bedside Swallow Evaluation Previous Swallow Assessment: prior MBSS 12/23 with suspicious  tongue base appearance, recommending ENT eval and ?esophageal narrowing distally recommending GI eval Diet Prior to this Study: Regular;Thin liquids (Level 0) Temperature Spikes Noted: No Respiratory Status: Room air History of Recent Intubation: No Behavior/Cognition: Alert;Cooperative;Pleasant mood Oral Cavity Assessment: Within Functional Limits Oral Care Completed by SLP: No Oral Cavity - Dentition: Adequate natural dentition Vision: Impaired for self-feeding Self-Feeding Abilities: Able to feed self Patient Positioning: Upright in bed Baseline Vocal Quality: Normal Volitional Cough: Strong Volitional  Swallow: Able to elicit    Oral/Motor/Sensory Function Overall Oral Motor/Sensory Function: Mild impairment Facial ROM: Reduced right Facial Symmetry: Abnormal symmetry right Facial Strength: Reduced right Lingual ROM: Within Functional Limits Lingual Symmetry: Within Functional Limits Lingual Strength: Within Functional Limits Lingual Sensation: Within Functional Limits Velum: Within Functional Limits Mandible: Within Functional Limits   Ice Chips     Thin Liquid Thin Liquid: Within functional limits Presentation: Cup    Puree Puree: Within functional limits Presentation: Self Fed   Solid     Solid: Within functional limits Presentation: Self Fed     Kylena Mole P. Harout Scheurich, M.S., CCC-SLP Speech-Language Pathologist Acute Rehabilitation Services Pager: [phone]  Kemara Quigley P Anneliese Leblond 10/29/2022,10:52 AM

## 2022-10-29 NOTE — TOC Initial Note (Signed)
Transition of Care [facility]) - Initial/Assessment Note    Patient Details  Name: Christina Orozco MRN: [id number] Date of Birth: January 13, 1971  [facility] of [facility]) CM/SW Contact:    Lawerance Sabal, RN Phone Number: 10/29/2022, 9:19 AM  Clinical Narrative:                  [name] Monday w patient over the phone. She is active w [facility], would like to resume at [facility]. She would like [facility] this will be delivered to her house through [facility] she understands it may be Tuesday or Wednesday for delivery.  Expected Discharge Plan: Home w Home Health Services Barriers to Discharge: Continued Medical Work up   Patient Goals and CMS Choice Patient states their goals for this hospitalization and ongoing recovery are:: to go home CMS Medicare.gov Compare Post Acute Care list provided to:: Patient Choice offered to / list presented to : Patient      Expected Discharge Plan and Services   Discharge Planning Services: CM Consult Post Acute Care Choice: Home Health, Durable Medical Equipment                   DME Arranged: Bedside commode DME Agency: [employer] Date DME Agency Contacted: 10/29/22 Time DME Agency Contacted: [phone] Representative spoke with at DME Agency: Vaughan Basta HH Arranged: PT, OT, Speech Therapy HH Agency: [facility]        Prior Living Arrangements/Services                       Activities of Daily Living Home Assistive Devices/Equipment: Environmental consultant (specify type), CPAP ADL Screening (condition at time of admission) Patient's cognitive ability adequate to safely complete daily activities?: Yes Is the patient deaf or have difficulty hearing?: No Does the patient have difficulty seeing, even when wearing glasses/contacts?: Yes Does the patient have difficulty concentrating, remembering, or making decisions?: No Patient able to express need for assistance with ADLs?: Yes Does the patient have difficulty dressing or bathing?: No Independently performs  ADLs?: No Communication: Needs assistance Is this a change from baseline?: Pre-admission baseline Dressing (OT): Needs assistance Is this a change from baseline?: Pre-admission baseline Grooming: Independent Feeding: Independent Bathing: Independent Toileting: Independent In/Out Bed: Independent with device (comment) Walks in Home: Independent with device (comment) Does the patient have difficulty walking or climbing stairs?: Yes Weakness of Legs: Both Weakness of Arms/Hands: Both  Permission Sought/Granted                  Emotional Assessment              Admission diagnosis:  Diplopia [H53.2] Dizziness [R42] Cerebrovascular accident (CVA), unspecified mechanism (HCC) [I63.9] Patient Active Problem List   Diagnosis Date Noted   OSA (obstructive sleep apnea) 08/25/2022   Subcortical infarction (HCC) 08/23/2022   Overweight (BMI 25.0-29.9) 08/23/2022   Asthma, chronic 08/22/2022   TIA (transient ischemic attack) 06/20/2022   Numbness and tingling in left hand 03/02/2022   Gait disturbance, post-stroke 03/02/2022   AKI (acute kidney injury) (HCC) 12/23/2021   Acute embolic stroke (HCC) 12/23/2021   Hypernatremia 12/20/2021   Chronic kidney disease (CKD), stage IV (severe) (HCC) 12/19/2021   Left-sided weakness and visual deficits  12/19/2021   Vision abnormalities 12/19/2021   Acute stroke due to ischemia [facility]) 11/10/2021   Depression 07/05/2020   CVA (cerebral vascular accident) (HCC) 06/21/2020   CVA (cerebrovascular accident) (HCC) 06/19/2020   Generalized anxiety disorder 03/01/2019   Abnormality of  gait 10/14/2018   Dizziness and giddiness 09/13/2018   Debility 06/26/2018   Chronic nonintractable headache 04/26/2018   Diastolic dysfunction    Congestion of nasal sinus    Pneumonia of left lower lobe due to infectious organism    SOB (shortness of breath)    Candidiasis    Anemia    Hypoalbuminemia due to protein-calorie malnutrition (HCC)     Hypertension    Type 2 diabetes mellitus with chronic kidney disease, without long-term current use of insulin (HCC)    PRES (posterior reversible encephalopathy syndrome)    Hypokalemia    History of CVA with residual deficit    History of seizure 03/25/2018   Bilateral carpal tunnel syndrome 02/14/2018   Arthritis 10/31/2017   Asthma 10/31/2017   Constipation 10/31/2017   Kidney stone 10/31/2017   Anemia, chronic disease 08/09/2017   Hyperlipidemia 07/26/2017   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HCC) 07/25/2017   Anxiety as acute reaction to exceptional stress 04/23/2017   Insomnia 10/08/2016   Neuropathy 06/07/2016   Gait instability    Lesion of pons    Slurred speech    Vertigo 04/30/2016   Hyperglycemia 04/30/2016   Chronic kidney disease (CKD), stage III (moderate) (HCC) 04/30/2016   Ataxia 04/30/2016   Chronic right shoulder pain 04/03/2016   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   Rhinitis, allergic 08/25/2015   Asthma, mild intermittent 07/14/2015   Gastroesophageal reflux disease without esophagitis 06/08/2015   Hypertensive cardiovascular disease 05/05/2015   Vitamin D deficiency 05/05/2015   PCP:  Kerin Salen, PA-C Pharmacy:   [facility] - [place], [place] - [address] [place] [address] Phone: [phone] Fax: [phone]     Social Determinants of Health (SDOH) Social History: SDOH Screenings   Food Insecurity: No Food Insecurity (10/28/2022)  Housing: Low Risk  (10/28/2022)  Transportation Needs: No Transportation Needs (10/28/2022)  Utilities: Not At Risk (10/28/2022)  Depression (PHQ2-9): Low Risk  (10/10/2022)  Tobacco Use: Low Risk  (10/28/2022)   SDOH Interventions:     Readmission Risk Interventions     No data to display

## 2022-10-29 NOTE — Evaluation (Signed)
Speech Language Pathology Evaluation Patient Details Name: Christina Orozco MRN: [id number] DOB: 05-May-1971 Today's Date: 10/29/2022 Time: 1005-1030 SLP Time Calculation (min) (ACUTE ONLY): 25 min  Problem List:  Patient Active Problem List   Diagnosis Date Noted   OSA (obstructive sleep apnea) 08/25/2022   Subcortical infarction (HCC) 08/23/2022   Overweight (BMI 25.0-29.9) 08/23/2022   Asthma, chronic 08/22/2022   TIA (transient ischemic attack) 06/20/2022   Numbness and tingling in left hand 03/02/2022   Gait disturbance, post-stroke 03/02/2022   AKI (acute kidney injury) (HCC) 12/23/2021   Acute embolic stroke (HCC) 12/23/2021   Hypernatremia 12/20/2021   Chronic kidney disease (CKD), stage IV (severe) (HCC) 12/19/2021   Left-sided weakness and visual deficits  12/19/2021   Vision abnormalities 12/19/2021   Acute stroke due to ischemia (HCC) 11/10/2021   Depression 07/05/2020   CVA (cerebral vascular accident) (HCC) 06/21/2020   CVA (cerebrovascular accident) (HCC) 06/19/2020   Generalized anxiety disorder 03/01/2019   Abnormality of gait 10/14/2018   Dizziness and giddiness 09/13/2018   Debility 06/26/2018   Chronic nonintractable headache 04/26/2018   Diastolic dysfunction    Congestion of nasal sinus    Pneumonia of left lower lobe due to infectious organism    SOB (shortness of breath)    Candidiasis    Anemia    Hypoalbuminemia due to protein-calorie malnutrition (HCC)    Hypertension    Type 2 diabetes mellitus with chronic kidney disease, without long-term current use of insulin (HCC)    PRES (posterior reversible encephalopathy syndrome)    Hypokalemia    History of CVA with residual deficit    History of seizure 03/25/2018   Bilateral carpal tunnel syndrome 02/14/2018   Arthritis 10/31/2017   Asthma 10/31/2017   Constipation 10/31/2017   Kidney stone 10/31/2017   Anemia, chronic disease 08/09/2017   Hyperlipidemia 07/26/2017   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HCC)  07/25/2017   Anxiety as acute reaction to exceptional stress 04/23/2017   Insomnia 10/08/2016   Neuropathy 06/07/2016   Gait instability    Lesion of pons    Slurred speech    Vertigo 04/30/2016   Hyperglycemia 04/30/2016   Chronic kidney disease (CKD), stage III (moderate) (HCC) 04/30/2016   Ataxia 04/30/2016   Chronic right shoulder pain 04/03/2016   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   Rhinitis, allergic 08/25/2015   Asthma, mild intermittent 07/14/2015   Gastroesophageal reflux disease without esophagitis 06/08/2015   Hypertensive cardiovascular disease 05/05/2015   Vitamin D deficiency 05/05/2015   Past Medical History:  Past Medical History:  Diagnosis Date   Anemia    severe   Anxiety    Asthma    CKD (chronic kidney disease)    stage IV   DDD (degenerative disc disease), cervical    Depression    Diabetes mellitus (HCC)    Dislocated shoulder    right   GERD (gastroesophageal reflux disease)    Headache    migraines (about once a month)   History of kidney stones    Hypertension    Neuropathy    Peripheral vascular disease (HCC)    Pneumonia    Stroke [facility])    Past Surgical History:  Past Surgical History:  Procedure Laterality Date   ADENOIDECTOMY     APPENDECTOMY     CERVICAL SPINE SURGERY  06/2012   C5-C6   EYE SURGERY     left eye surgery    FOOT SURGERY     KENALOG INJECTION Left 03/10/2021   Procedure: Intravitreal Avastin  Injection;  Surgeon: Stephannie Li, MD;  Location: [facility] OR;  Service: Ophthalmology;  Laterality: Left;   LOOP RECORDER INSERTION N/A 08/27/2017   Procedure: LOOP RECORDER INSERTION;  Surgeon: Thurmon Fair, MD;  Location: [facility] INVASIVE CV LAB;  Service: Cardiovascular;  Laterality: N/A;   MEMBRANE PEEL Left 03/10/2021   Procedure: MEMBRANE PEEL;  Surgeon: Stephannie Li, MD;  Location: [facility] OR;  Service: Ophthalmology;  Laterality: Left;   PARS PLANA VITRECTOMY Left 03/10/2021   Procedure: TWENTY-FIVE GAUGE PARS PLANA  VITRECTOMY;  Surgeon: Stephannie Li, MD;  Location: [facility] OR;  Service: Ophthalmology;  Laterality: Left;   PHOTOCOAGULATION WITH LASER Left 03/10/2021   Procedure: PHOTOCOAGULATION WITH LASER;  Surgeon: Stephannie Li, MD;  Location: [facility] OR;  Service: Ophthalmology;  Laterality: Left;   SHOULDER SURGERY     TEE WITHOUT CARDIOVERSION N/A 08/27/2017   Procedure: TRANSESOPHAGEAL ECHOCARDIOGRAM (TEE);  Surgeon: Thurmon Fair, MD;  Location: [facility] ENDOSCOPY;  Service: Cardiovascular;  Laterality: N/A;   TONSILLECTOMY     HPI:  Christina Orozco is a 52 yr old lady with 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 She reports that Wednesday 10/25/22 she was doing well - home health nurse checked BP, glucose both well controlled. Early Thursday 10/26/22 patient went to [facility] ED due to N/V and diarrhea. She was diagnosed with a stomach virus and went home. Friday, 10/27/22 she missed an appointment with her retinologist because her ride was late. She went shopping with her daughter and grandchildren and was doing well. Driving at she reports that precisely at 6:20 PM she developed double vision w/o headache. She presented to [facility] for evaluation. Head CT was negative. Teleneuro recommended admission to [facility] for stroke workup.  HEAD MRA :Positive for punctate acute right temporal lobe white matter  lacunar infarct. And two small subacute appearing white matter  infarcts in the Left corona radiata, near the left splenium. No  associated hemorrhage or mass effect.  2. 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 Conspicuous periventricular white matter involvement,  although chronic demyelinating disease is felt less likely.   Assessment / Plan / Recommendation Clinical Impression  Christina Orozco reports memory difficulty and slurred speech. She demonstrates a mild dysarthria that is  intermittent in nature. She remains fully intelligible despite the dysarthria. She reports feeling her tongue is very full and thick. Her cognition tests well, but informally appears to be impaired and is c/b redirection required for staying on task, repetition of directions or questions, and overall reduced working Clinical cytogeneticist. Delayed memory tested normal; however, she required extended time and trials to code the information. This may be r/t known prior aphasia (comprehension deficits). She was able to follow directions well given extended time and single repetition of directions. Conversation is c/b prolonged processing speeds. She is aware of her deficits and is anxious for therapy via HH, as she was receiving prior to hospitalization. She is expected to benefit from further cognitive-linguistic testing at next level of care [facility]) to further evaluate comprehension and executive functions.    SLP Assessment  SLP Recommendation/Assessment: Patient needs continued Speech Lanaguage Pathology Services SLP Visit Diagnosis: Dysarthria and anarthria (R47.1);Cognitive communication deficit (R41.841)    Recommendations for follow up therapy are one component of a multi-disciplinary discharge planning process, led by the attending physician.  Recommendations may be updated based on patient status, additional functional criteria and insurance  authorization.    Follow Up Recommendations  Home health SLP    Assistance Recommended at Discharge  Intermittent Supervision/Assistance  Functional Status Assessment Patient has had a recent decline in their functional status and demonstrates the ability to make significant improvements in function in a reasonable and predictable amount of time.  Frequency and Duration min 1 x/week  1 week      SLP Evaluation Cognition  Overall Cognitive Status: Impaired/Different from baseline Orientation Level: Oriented X4       Comprehension  Auditory  Comprehension Overall Auditory Comprehension: Impaired Yes/No Questions: Within Functional Limits Conversation: Complex Interfering Components: Attention;Working memory;Processing speed EffectiveTechniques: Extra processing time    Expression Expression Primary Mode of Expression: Verbal Verbal Expression Overall Verbal Expression: Appears within functional limits for tasks assessed Initiation: No impairment Written Expression Dominant Hand: Right   Oral / Motor  Oral Motor/Sensory Function Overall Oral Motor/Sensory Function: Mild impairment Facial ROM: Reduced right Facial Symmetry: Abnormal symmetry right Facial Strength: Reduced right Lingual ROM: Within Functional Limits Lingual Symmetry: Within Functional Limits Lingual Strength: Within Functional Limits Lingual Sensation: Within Functional Limits Velum: Within Functional Limits Mandible: Within Functional Limits Motor Speech Overall Motor Speech: Appears within functional limits for tasks assessed Respiration: Within functional limits Phonation: Normal Resonance: Within functional limits Articulation: Within functional limitis Intelligibility: Intelligible Motor Planning: Witnin functional limits            Charina Fons P Maquita Sandoval 10/29/2022, 11:03 AM

## 2022-10-29 NOTE — Plan of Care (Signed)
Problem: Education: Goal: Knowledge of General Education information will improve Description: Including pain rating scale, medication(s)/side effects and non-pharmacologic comfort measures 10/29/2022 1725 by Arnell Asal, RN Outcome: Progressing 10/29/2022 1723 by Arnell Asal, RN Outcome: Progressing   Problem: Health Behavior/Discharge Planning: Goal: Ability to manage health-related needs will improve 10/29/2022 1725 by Arnell Asal, RN Outcome: Progressing 10/29/2022 1723 by Arnell Asal, RN Outcome: Progressing   Problem: Clinical Measurements: Goal: Ability to maintain clinical measurements within normal limits will improve 10/29/2022 1725 by Arnell Asal, RN Outcome: Progressing 10/29/2022 1723 by Arnell Asal, RN Outcome: Progressing Goal: Will remain free from infection 10/29/2022 1725 by Arnell Asal, RN Outcome: Progressing 10/29/2022 1723 by Arnell Asal, RN Outcome: Progressing Goal: Diagnostic test results will improve 10/29/2022 1725 by Arnell Asal, RN Outcome: Progressing 10/29/2022 1723 by Arnell Asal, RN Outcome: Progressing Goal: Respiratory complications will improve 10/29/2022 1725 by Arnell Asal, RN Outcome: Progressing 10/29/2022 1723 by Arnell Asal, RN Outcome: Progressing Goal: Cardiovascular complication will be avoided 10/29/2022 1725 by Arnell Asal, RN Outcome: Progressing 10/29/2022 1723 by Arnell Asal, RN Outcome: Progressing   Problem: Activity: Goal: Risk for activity intolerance will decrease 10/29/2022 1725 by Arnell Asal, RN Outcome: Progressing 10/29/2022 1723 by Arnell Asal, RN Outcome: Progressing   Problem: Nutrition: Goal: Adequate nutrition will be maintained 10/29/2022 1725 by Arnell Asal, RN Outcome: Progressing 10/29/2022 1723 by Arnell Asal, RN Outcome: Progressing   Problem:  Coping: Goal: Level of anxiety will decrease 10/29/2022 1725 by Arnell Asal, RN Outcome: Progressing 10/29/2022 1723 by Arnell Asal, RN Outcome: Progressing   Problem: Elimination: Goal: Will not experience complications related to bowel motility 10/29/2022 1725 by Arnell Asal, RN Outcome: Progressing 10/29/2022 1723 by Arnell Asal, RN Outcome: Progressing Goal: Will not experience complications related to urinary retention 10/29/2022 1725 by Arnell Asal, RN Outcome: Progressing 10/29/2022 1723 by Arnell Asal, RN Outcome: Progressing   Problem: Pain Managment: Goal: General experience of comfort will improve 10/29/2022 1725 by Arnell Asal, RN Outcome: Progressing 10/29/2022 1723 by Arnell Asal, RN Outcome: Progressing   Problem: Safety: Goal: Ability to remain free from injury will improve 10/29/2022 1725 by Arnell Asal, RN Outcome: Progressing 10/29/2022 1723 by Arnell Asal, RN Outcome: Progressing   Problem: Skin Integrity: Goal: Risk for impaired skin integrity will decrease 10/29/2022 1725 by Arnell Asal, RN Outcome: Progressing 10/29/2022 1723 by Arnell Asal, RN Outcome: Progressing   Problem: Education: Goal: Knowledge of disease or condition will improve 10/29/2022 1725 by Arnell Asal, RN Outcome: Progressing 10/29/2022 1723 by Arnell Asal, RN Outcome: Progressing Goal: Knowledge of secondary prevention will improve (MUST DOCUMENT ALL) 10/29/2022 1725 by Arnell Asal, RN Outcome: Progressing 10/29/2022 1723 by Arnell Asal, RN Outcome: Progressing Goal: Knowledge of patient specific risk factors will improve Loraine Leriche N/A or DELETE if not current risk factor) 10/29/2022 1725 by Arnell Asal, RN Outcome: Progressing 10/29/2022 1723 by Arnell Asal, RN Outcome: Progressing   Problem: Ischemic Stroke/TIA Tissue  Perfusion: Goal: Complications of ischemic stroke/TIA will be minimized 10/29/2022 1725 by Arnell Asal, RN Outcome: Progressing 10/29/2022 1723 by Arnell Asal, RN Outcome: Progressing   Problem: Coping: Goal: Will verbalize positive feelings about self 10/29/2022 1725 by Arnell Asal, RN Outcome: Progressing 10/29/2022 1723 by Arnell Asal, RN Outcome: Progressing Goal: Will identify appropriate support needs 10/29/2022  1725 by Arnell Asal, RN Outcome: Progressing 10/29/2022 1723 by Arnell Asal, RN Outcome: Progressing   Problem: Health Behavior/Discharge Planning: Goal: Ability to manage health-related needs will improve 10/29/2022 1725 by Arnell Asal, RN Outcome: Progressing 10/29/2022 1723 by Arnell Asal, RN Outcome: Progressing Goal: Goals will be collaboratively established with patient/family 10/29/2022 1725 by Arnell Asal, RN Outcome: Progressing 10/29/2022 1723 by Arnell Asal, RN Outcome: Progressing   Problem: Self-Care: Goal: Ability to participate in self-care as condition permits will improve 10/29/2022 1725 by Arnell Asal, RN Outcome: Progressing 10/29/2022 1723 by Arnell Asal, RN Outcome: Progressing Goal: Verbalization of feelings and concerns over difficulty with self-care will improve 10/29/2022 1725 by Arnell Asal, RN Outcome: Progressing 10/29/2022 1723 by Arnell Asal, RN Outcome: Progressing Goal: Ability to communicate needs accurately will improve 10/29/2022 1725 by Arnell Asal, RN Outcome: Progressing 10/29/2022 1723 by Arnell Asal, RN Outcome: Progressing   Problem: Nutrition: Goal: Risk of aspiration will decrease 10/29/2022 1725 by Arnell Asal, RN Outcome: Progressing 10/29/2022 1723 by Arnell Asal, RN Outcome: Progressing Goal: Dietary intake will improve 10/29/2022 1725 by Arnell Asal,  RN Outcome: Progressing 10/29/2022 1723 by Arnell Asal, RN Outcome: Progressing

## 2022-10-29 NOTE — Progress Notes (Addendum)
STROKE TEAM PROGRESS NOTE   INTERVAL HISTORY  No family at the bedside.  Patient is laying in the bed in no apparent distress.  States that her vision issues are consistent and describes "tunnel vision" and depth perception issues, denies diplopia.  Vision disturbances are improved with covering of one eye.   Vitals:   10/28/22 2019 10/28/22 2333 10/29/22 0336 10/29/22 0826  BP: (!) 146/78 131/75 (!) 140/75 (!) 148/69  Pulse: 73 71 67 72  Resp: 19 16 16 17   Temp: 98.4 F (36.9 C) 98.1 F (36.7 C) 97.6 F (36.4 C) 98.4 F (36.9 C)  TempSrc: Oral Oral Oral Oral  SpO2: 100% 100% 100% 100%  Weight:      Height:       CBC:  Recent Labs  Lab 10/26/22 0401 10/27/22 2158  WBC 11.8* 7.1  NEUTROABS 10.1* 4.1  HGB 9.8* 10.2*  HCT 30.5* 30.6*  MCV 90.5 88.2  PLT 234 226    Basic Metabolic Panel:  Recent Labs  Lab 10/26/22 0401 10/27/22 2158  NA 141 142  K 3.5 3.4*  CL 114* 111  CO2 17* 21*  GLUCOSE 169* 137*  BUN 38* 34*  CREATININE 2.64* 2.86*  CALCIUM 9.4 9.4    Lipid Panel: No results for input(s): "CHOL", "TRIG", "HDL", "CHOLHDL", "VLDL", "LDLCALC" in the last 168 hours. HgbA1c: No results for input(s): "HGBA1C" in the last 168 hours. Urine Drug Screen:  Recent Labs  Lab 10/27/22 2357  LABOPIA NONE DETECTED  COCAINSCRNUR NONE DETECTED  LABBENZ NONE DETECTED  AMPHETMU NONE DETECTED  THCU NONE DETECTED  LABBARB NONE DETECTED     Alcohol Level  Recent Labs  Lab 10/27/22 2158  ETH <10    IMAGING past 24 hours No results found.  PHYSICAL EXAM  Temp:  [97.6 F (36.4 C)-98.4 F (36.9 C)] 98.4 F (36.9 C) (05/19 0826) Pulse Rate:  [67-78] 72 (05/19 0826) Resp:  [16-19] 17 (05/19 0826) BP: (131-148)/(69-78) 148/69 (05/19 0826) SpO2:  [100 %] 100 % (05/19 0826) FiO2 (%):  [21 %] 21 % (05/18 2046)  General - Well nourished, well developed, in no apparent distress. Cardiovascular - Regular rhythm and rate.  Mental Status -  Level of arousal and  orientation to time, place, and person were intact. Language including expression, naming, repetition, comprehension was assessed and found intact. Attention span and concentration were normal. Recent and remote memory were intact. Fund of Knowledge was assessed and was intact. Mild dysarthria noted on exam, patient states this has been chronic since March of 2024.   Cranial Nerves II - XII - II - Visual field intact OU.  Subjectively states blurry vision III, IV, VI -right INO, left eye with nystagmus on leftward gaze V - Facial sensation intact bilaterally. VII -left facial droop (old) VIII - Hearing & vestibular intact bilaterally. X - Palate elevates symmetrically. XI - Chin turning & shoulder shrug intact bilaterally. XII - Tongue protrusion intact.  Motor Strength -left arm and leg 4/5, right arm and right leg 5/5 Motor Tone - Muscle tone was assessed at the neck and appendages and was normal.  Sensory - Light touch, temperature/pinprick were assessed and were symmetrical.    Coordination -mild ataxia in left arm  Gait and Station - deferred.  ASSESSMENT/PLAN [name] is a 52 y.o. 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 She  reports that Wednesday 10/25/22 she was doing well - home health nurse checked BP, glucose both well controlled. Early Thursday 10/26/22 patient went to [facility] ED due to N/V and diarrhea. She was diagnosed with a stomach virus and went home. Friday, 10/27/22 she missed an appointment with her retinologist because her ride was late. She went shopping with her daughter and grandchildren and was doing well. Driving at she reports that precisely at 6:20 PM she developed double vision w/o headache   Stroke: Multiple acute subacute punctate right temporal lobe ischemic infarct and left corona radiata Etiology:  Small vessel disease Code Stroke  CT head No acute abnormality. Atrophy. ASPECTS 10.    MRI acute punctate right temporal lobe ischemic infarct, 2 small subacute infarcts in left corona radiata MRA negative 2D Echo 08/2022-EF 65 to 70%.  Moderate asymmetric left ventricular hypertrophy.-Hypertrophied papillary muscles LDL 35 HgbA1c 6.3 VTE prophylaxis -heparin subcu    Diet   Diet heart healthy/carb modified Room service appropriate? No; Fluid consistency: Thin   Aspirin 81 mg twice daily and Brilinta 90 mg twice daily prior to admission, now on aspirin 81 mg twice daily and Brilinta 90 mg twice daily.  Therapy recommendations: Home health SLP, home health PT, continue home health OT Disposition: Pending  Hx stroke/TIA 08/2015 CSF neg, protein 58 07/2017 MRI showed left pontine infarct, EF 65 to 70%, carotid Doppler and MRA negative.  LDL 43, A1c 12.5, patient put on DAPT and Lipitor 40. 08/2017 Loop recorder placed, no A. fib found 02/27/2019 woke up with complete vision loss, gradually vision resumed OD, still has better vision OS.  MRI at that time showed 3 mm acute to early subacute infarct in the dorsal midline of the pons negative MRI head and neck. 07/2019 loop recorder explanted 05/01/2020 MRI showed new T2 hyperintense lesion of the right frontal white matter likely reflect an area of late subacute or chronic infarction. 06/19/2020 Subcentimeter likely subacute infarct at the left lateral aspect of the pons.  MRA head unremarkable, carotid Doppler unremarkable, EF 70 to 75%.  LDL 63, A1c 6.2.  Put on aspirin and Brilinta for 1 months and then back to aspirin and the Plavix.  Continue Lipitor 80. 11/10/2021 - MRI showed possible small right cerebral peduncle infarcts.  MRA negative.  Carotid Doppler unremarkable.  EF 60 to 65%, LDL 34, A1c 6.5, discharged with ASA and brilinta 90 twice daily.  Outpatient follow-up with [facility] neurology, continued on aspirin and Brilinta 60 twice  daily. 06/20/2022- TIA with confusion, EEG negative, negative for stroke 08/22/2022-right hemispheric TIA from small vessel disease.  Silent left subcortical lacunar infarct.  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 versus PRES EEG 01/2022 normal Monitor  Hypertension Home meds: Amlodipine 10 mg, Coreg 12.5 mg, Stable Permissive hypertension (OK if < 220/120) but gradually normalize in 5-7 days Long-term BP goal normotensive  Hyperlipidemia Home meds: Atorvastatin 80 mg, resumed in hospital LDL 35, goal < 70 Continue statin at discharge  Diabetes type II Controlled Home meds: Ozempic HgbA1c 6.3, goal < 7.0 CBGs Recent Labs    10/27/22 2149  GLUCAP 148*     SSI  Other Stroke Risk Factors Hx stroke/TIA Obstructive sleep apnea, on CPAP at home  Other Active Problems GERD Asthma CKD PAD/PVD  Hospital day # 0  Christina Orozco, AGACNP-BC [facility] Pager: [phone]  ATTENDING ATTESTATION:  As above, will sign off.  Dr. Viviann Spare evaluated pt independently, reviewed imaging, chart, labs. Discussed and formulated plan with the Resident/APP. Changes were made to the note where appropriate. Please see APP/resident note above for details.   Christina Sevey,MD    To contact Stroke Continuity provider, please refer to [email or web address]. After hours, contact General Neurology

## 2022-10-29 NOTE — Progress Notes (Signed)
[facility]                                                                               [place], is a 52 y.o. female, DOB - 1970/10/10, ZOX:[id number] Admit date - 10/27/2022    Outpatient Primary MD for the patient is Kerin Salen, PA-C  LOS - 0  days    Brief summary   52 yr old lady with 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    Assessment & Plan    Assessment and Plan:  Punctate acute right temporal lobe white matter lacunar infarct. And two small subacute appearing white matter infarcts in the Left corona radiata, near the left splenium. - continue with aspirin 81 BID, Brilinta 90 mg BID. Lipitor 80 mg daily,  MRA head and neck. LDL is 35, A1c is 6.3%.   Last echo on 3/24. Neurology on board.    Hypertension Well controlled.  Continue with norvasc 10 mg, coreg 12.5 mg BID.   Asthma, mild intermittent  Stable.    Chronic kidney disease (CKD), stage IV (severe) (HCC) with metabolic acidosis:  Creatinine stable .    Hypokalemia:  Replaced.    Type 2 diabetes mellitus with chronic kidney disease, without long-term current use of insulin (HCC) Lat A1C 08/22/22 6.3%.  Recent Labs    10/27/22 2149  GLUCAP 148*   Started her on SSI, Semaglutide   Gastroesophageal reflux disease without esophagitis Stable, continue with PPI.      Estimated body mass index is 25.48 kg/m as calculated from the following:   Height as of this encounter: 5\' 2"  (1.575 m).   Weight as of this encounter: 63.2 kg.  Code Status: full code.  DVT Prophylaxis:  Place and maintain sequential compression device Start: 10/28/22 0740 heparin injection 5,000 Units Start: 10/28/22 0600   Level of Care: Level of care: Telemetry Medical Family Communication: none at bedside.   Disposition Plan:     Remains inpatient appropriate:  pending   Procedures:  Echo   Consultants:    Neurology.   Antimicrobials:   Anti-infectives (From admission, onward)    None        Medications  Scheduled Meds:  amLODipine  10 mg Oral Daily   aspirin EC  81 mg Oral BID   atorvastatin  80 mg Oral QHS   carvedilol  12.5 mg Oral BID WC   fluticasone  2 spray Each Nare Daily   heparin  5,000 Units Subcutaneous Q8H   montelukast  10 mg Oral QHS   pantoprazole  40 mg Oral Daily   Semaglutide(0.25 or 0.5MG /DOS)  0.5 mg Subcutaneous See admin instructions   sertraline  50 mg Oral QHS   ticagrelor  90 mg Oral BID   Continuous Infusions: PRN Meds:.acetaminophen **OR** acetaminophen (TYLENOL) oral liquid 160 mg/5 mL **OR** acetaminophen, albuterol, labetalol, ondansetron, senna-docusate    Subjective:   Christina Orozco was seen and examined today.  Persistent blurry vision in the right eye.   Objective:   Vitals:   10/29/22 0336 10/29/22 0826 10/29/22 1203 10/29/22 1721  BP: Marland Kitchen)  140/75 (!) 148/69 (!) 151/65 (!) 144/71  Pulse: 67 72 76 80  Resp: 16 17 16 16   Temp: 97.6 F (36.4 C) 98.4 F (36.9 C) 97.8 F (36.6 C) 98.6 F (37 C)  TempSrc: Oral Oral Oral Oral  SpO2: 100% 100% 100% 100%  Weight:      Height:        Intake/Output Summary (Last 24 hours) at 10/29/2022 1735 Last data filed at 10/29/2022 1300 Gross per 24 hour  Intake 480 ml  Output --  Net 480 ml   Filed Weights   10/28/22 0135  Weight: 63.2 kg     Exam General exam: Appears calm and comfortable  Respiratory system: Clear to auscultation. Respiratory effort normal. Cardiovascular system: S1 & S2 heard, RRR. No JVD,  Gastrointestinal system: Abdomen is nondistended, soft and nontender.  Central nervous system: Alert and oriented.  Extremities: Symmetric 5 x 5 power. Skin: No rashes, Psychiatry:Mood & affect appropriate.     Data Reviewed:  I have personally reviewed following labs and imaging studies   CBC Lab Results  Component Value Date   WBC 7.1 10/27/2022   RBC 3.47 (L)  10/27/2022   HGB 10.2 (L) 10/27/2022   HCT 30.6 (L) 10/27/2022   MCV 88.2 10/27/2022   MCH 29.4 10/27/2022   PLT 226 10/27/2022   MCHC 33.3 10/27/2022   RDW 14.3 10/27/2022   LYMPHSABS 2.4 10/27/2022   MONOABS 0.5 10/27/2022   EOSABS 0.1 10/27/2022   BASOSABS 0.0 10/27/2022     Last metabolic panel Lab Results  Component Value Date   NA 142 10/27/2022   K 3.4 (L) 10/27/2022   CL 111 10/27/2022   CO2 21 (L) 10/27/2022   BUN 34 (H) 10/27/2022   CREATININE 2.86 (H) 10/27/2022   GLUCOSE 137 (H) 10/27/2022   GFRNONAA 20 (L) 10/27/2022   GFRAA 27 (L) 06/17/2020   CALCIUM 9.4 10/27/2022   PHOS 5.1 (H) 12/23/2021   PROT 7.4 10/27/2022   ALBUMIN 4.3 10/27/2022   LABGLOB 2.5 06/17/2020   AGRATIO 1.7 06/17/2020   BILITOT 0.3 10/27/2022   ALKPHOS 45 10/27/2022   AST 16 10/27/2022   ALT 15 10/27/2022   ANIONGAP 10 10/27/2022    CBG (last 3)  Recent Labs    10/27/22 2149  GLUCAP 148*      Coagulation Profile: Recent Labs  Lab 10/27/22 2158  INR 1.0     Radiology Studies: MR ANGIO HEAD WO CONTRAST  Result Date: 10/28/2022 CLINICAL DATA:  52 year old female code stroke presentation yesterday. Advanced acute on chronic small vessel disease on MRI. EXAM: MRA HEAD WITHOUT CONTRAST TECHNIQUE: Angiographic images of the Circle of Willis were acquired using MRA technique without intravenous contrast. COMPARISON:  Brain MRI today. CTA head and neck 08/22/2022. Intracranial MRA 06/21/2022, and earlier. FINDINGS: Mildly motion degraded today. Anterior circulation: Antegrade flow in both ICA siphons appears stable from last year. No siphon stenosis. Ophthalmic and left posterior communicating artery origins appear normal. Patent carotid termini. MCA and ACA origins appear normal. Diminutive anterior communicating artery. Visible ACA branches are stable and within normal limits. MCA M1 segments and bi/trifurcations remain patent without stenosis. Visible bilateral MCA branches are  stable and within normal limits. Posterior circulation: Antegrade flow in the posterior circulation appears stable from last year. Dominant left vertebral artery. Both PICA origins remain patent. No convincing distal vertebral or basilar stenosis. SCA and PCA origins remain normal. Left posterior communicating artery is present. Bilateral PCA branches are within normal  limits. Anatomic variants: Dominant left vertebral artery. Other: Numerous susceptibility foci in the brainstem and deep gray nuclei in keeping with the known chronic parenchymal microhemorrhages. Brain MRI today reported separately. IMPRESSION: Intracranial MRA remains negative, stable compared to June of last year. Electronically Signed   By: Odessa Fleming M.D.   On: 10/28/2022 08:07   MR BRAIN WO CONTRAST  Result Date: 10/28/2022 CLINICAL DATA:  52 year old female code stroke presentation yesterday. History of small vessel disease. EXAM: MRI HEAD WITHOUT CONTRAST TECHNIQUE: Multiplanar, multiecho pulse sequences of the brain and surrounding structures were obtained without intravenous contrast. COMPARISON:  Head CT yesterday.  Brain MRI 08/22/2022. FINDINGS: Brain: Small new areas of abnormal diffusion in the posterior left corona radiata (series 5, image 83), tracking toward the posterior left lentiform. And also along the nearby lateral margin of the left splenium corpus callosum (series 5, image 84). These areas are not conspicuous on ADC and could be subacute. Additionally, there is a small round focus of restricted diffusion in the right temporal lobe periventricular white matter anteriorly series 5, image 69. This is about 5 mm. Faint T2 and FLAIR hyperintensity here. No other diffusion restriction. Chronic severe signal abnormality in the brainstem and cerebellar peduncles which may in part be Wallerian degeneration. Multifocal chronic bilateral deep gray nuclei lacunar infarcts. Extensive cerebral white matter T2 and FLAIR hyperintensity  including some areas of corpus callosum encephalomalacia and lesions oriented perpendicular to the lateral ventricles. But there is no cystic encephalomalacia of the corpus callosum. Outside of the diffusion findings described above signal appears stable throughout the brain. Moderate number of chronic microhemorrhages are scattered in the bilateral deep gray nuclei, and especially throughout the brainstem. Occasional hemispheric chronic microhemorrhages also. No midline shift, mass effect, evidence of mass lesion, ventriculomegaly, extra-axial collection or acute intracranial hemorrhage. Cervicomedullary junction and pituitary are within normal limits. Vascular: Major intracranial vascular flow voids are stable. MRA today is reported separately. Skull and upper cervical spine: Negative visible cervical spine. Visualized bone marrow signal is within normal limits. Sinuses/Orbits: Stable, negative. Other: Stable mild mastoid effusions. Benign right forehead scalp lipoma. IMPRESSION: 1. Positive for punctate acute right temporal lobe white matter lacunar infarct. And two small subacute appearing white matter infarcts in the Left corona radiata, near the left splenium. No associated hemorrhage or mass effect. 2. Underlying chronic severe small vessel disease, including numerous chronic microhemorrhages in the brainstem with Wallerian degeneration. Conspicuous periventricular white matter involvement, although chronic demyelinating disease is felt less likely. 3. MRA today is reported separately. Electronically Signed   By: Odessa Fleming M.D.   On: 10/28/2022 08:00   CT HEAD CODE STROKE WO CONTRAST  Result Date: 10/27/2022 CLINICAL DATA:  Code stroke. Initial evaluation for neuro deficit, stroke. EXAM: CT HEAD WITHOUT CONTRAST TECHNIQUE: Contiguous axial images were obtained from the base of the skull through the vertex without intravenous contrast. RADIATION DOSE REDUCTION: This exam was performed according to the  departmental dose-optimization program which includes automated exposure control, adjustment of the mA and/or kV according to patient size and/or use of iterative reconstruction technique. COMPARISON:  Prior study from 08/22/2022. FINDINGS: Brain: Mild cerebral and cerebellar atrophy for age. Moderate chronic microvascular ischemic disease with multiple remote lacunar infarcts about the deep gray nuclei. No acute intracranial hemorrhage. No acute large vessel territory infarct. No mass lesion or midline shift. No hydrocephalus or extra-axial fluid collection. Vascular: No abnormal hyperdense vessel. Skull: Scalp soft tissues and calvarium within normal limits. Sinuses/Orbits: Globes and orbital  soft tissues demonstrate no acute finding. Paranasal sinuses mastoid air cells are clear. Other: None. ASPECTS [facility] Stroke Program Early CT Score) - Ganglionic level infarction (caudate, lentiform nuclei, internal capsule, insula, M1-M3 cortex): 7 - Supraganglionic infarction (M4-M6 cortex): 3 Total score (0-10 with 10 being normal): 10 IMPRESSION: 1. No acute intracranial abnormality. 2. ASPECTS is 10. 3. Mildly advanced cerebral and cerebellar atrophy with moderate chronic microvascular ischemic disease. Results were called by telephone at the time of interpretation on 10/27/2022 at 10:40 pm to provider Vonita Moss , who verbally acknowledged these results. Electronically Signed   By: Rise Mu M.D.   On: 10/27/2022 22:42       Kathlen Mody M.D. [facility] 10/29/2022, 5:35 PM  Available via Epic secure chat 7am-7pm After 7 pm, please refer to night coverage provider listed on amion.

## 2022-10-30 ENCOUNTER — Observation Stay (HOSPITAL_COMMUNITY): Payer: 59

## 2022-10-30 DIAGNOSIS — G4733 Obstructive sleep apnea (adult) (pediatric): Secondary | ICD-10-CM | POA: Diagnosis not present

## 2022-10-30 DIAGNOSIS — E1122 Type 2 diabetes mellitus with diabetic chronic kidney disease: Secondary | ICD-10-CM | POA: Diagnosis not present

## 2022-10-30 DIAGNOSIS — I639 Cerebral infarction, unspecified: Secondary | ICD-10-CM | POA: Diagnosis not present

## 2022-10-30 DIAGNOSIS — J4520 Mild intermittent asthma, uncomplicated: Secondary | ICD-10-CM | POA: Diagnosis not present

## 2022-10-30 LAB — BASIC METABOLIC PANEL
Anion gap: 8 (ref 5–15)
BUN: 42 mg/dL — ABNORMAL HIGH (ref 6–20)
CO2: 19 mmol/L — ABNORMAL LOW (ref 22–32)
Calcium: 8.2 mg/dL — ABNORMAL LOW (ref 8.9–10.3)
Chloride: 112 mmol/L — ABNORMAL HIGH (ref 98–111)
Creatinine, Ser: 3.22 mg/dL — ABNORMAL HIGH (ref 0.44–1.00)
GFR, Estimated: 17 mL/min — ABNORMAL LOW (ref >60)
Glucose, Bld: 143 mg/dL — ABNORMAL HIGH (ref 70–99)
Potassium: 3.9 mmol/L (ref 3.5–5.1)
Sodium: 139 mmol/L (ref 135–145)

## 2022-10-30 LAB — GLUCOSE, CAPILLARY
Glucose-Capillary: 117 mg/dL — ABNORMAL HIGH (ref 70–99)
Glucose-Capillary: 126 mg/dL — ABNORMAL HIGH (ref 70–99)
Glucose-Capillary: 107 mg/dL — ABNORMAL HIGH (ref 70–99)
Glucose-Capillary: 113 mg/dL — ABNORMAL HIGH (ref 70–99)

## 2022-10-30 LAB — CBC WITH DIFFERENTIAL/PLATELET
Abs Immature Granulocytes: 0.01 10*3/uL (ref 0.00–0.07)
Basophils Absolute: 0 10*3/uL (ref 0.0–0.1)
Basophils Relative: 1 %
Eosinophils Absolute: 0.1 10*3/uL (ref 0.0–0.5)
Eosinophils Relative: 2 %
HCT: 24.5 % — ABNORMAL LOW (ref 36.0–46.0)
Hemoglobin: 7.9 g/dL — ABNORMAL LOW (ref 12.0–15.0)
Immature Granulocytes: 0 %
Lymphocytes Relative: 43 %
Lymphs Abs: 2.6 10*3/uL (ref 0.7–4.0)
MCH: 29 pg (ref 26.0–34.0)
MCHC: 32.2 g/dL (ref 30.0–36.0)
MCV: 90.1 fL (ref 80.0–100.0)
Monocytes Absolute: 0.4 10*3/uL (ref 0.1–1.0)
Monocytes Relative: 7 %
Neutro Abs: 2.9 10*3/uL (ref 1.7–7.7)
Neutrophils Relative %: 47 %
Platelets: 174 10*3/uL (ref 150–400)
RBC: 2.72 MIL/uL — ABNORMAL LOW (ref 3.87–5.11)
RDW: 14 % (ref 11.5–15.5)
WBC: 6.1 10*3/uL (ref 4.0–10.5)
nRBC: 0 % (ref 0.0–0.2)

## 2022-10-30 LAB — HEMOGLOBIN A1C
Hgb A1c MFr Bld: 6.2 % — ABNORMAL HIGH (ref 4.8–5.6)
Mean Plasma Glucose: 131.24 mg/dL

## 2022-10-30 MED ORDER — NAPHAZOLINE-GLYCERIN 0.012-0.25 % OP SOLN
1.0000 [drp] | Freq: Four times a day (QID) | OPHTHALMIC | Status: DC | PRN
  Administered 2022-10-30 – 2022-10-31 (×2): 2 [drp] via OPHTHALMIC
  Filled 2022-10-30: qty 15

## 2022-10-30 MED ORDER — SODIUM CHLORIDE 0.9 % IV SOLN: INTRAVENOUS | Status: DC

## 2022-10-30 NOTE — Progress Notes (Signed)
Physical Therapy Treatment Patient Details Name: Christina Orozco MRN: [id number] DOB: 1970-12-06 Today's Date: 10/30/2022   History of Present Illness 52 yr old lady with presented to [facility] with diplopia. MRI with  Positive for punctate acute right temporal lobe white matter  lacunar infarct. And two small subacute appearing white matter  infarcts in the Left corona radiata, near the left splenium. 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 Recently discharged from [facility] in March 2024. Currently receiving home health nursing, PT, and OT services.    PT Comments    Pt emotionally labile at beginning of session, describing new strokes and deficits; provided supportive listening. Pt ambulating room distances with a RW and performing ADL's with set up assist. Pt declined hallway ambulation at this time. Will benefit from follow up HHPT at d/c.   Recommendations for follow up therapy are one component of a multi-disciplinary discharge planning process, led by the attending physician.  Recommendations may be updated based on patient status, additional functional criteria and insurance authorization.  Follow Up Recommendations       Assistance Recommended at Discharge Frequent or constant Supervision/Assistance  Patient can return home with the following A little help with walking and/or transfers;A little help with bathing/dressing/bathroom;Assistance with cooking/housework;Direct supervision/assist for medications management;Direct supervision/assist for financial management;Assist for transportation   Equipment Recommendations  None recommended by PT    Recommendations for Other Services       Precautions / Restrictions Precautions Precautions: Other (comment) Precaution Comments: visual impairment Restrictions Weight Bearing Restrictions: No     Mobility  Bed Mobility Overal bed mobility: Modified Independent                  Transfers Overall  transfer level: Modified independent Equipment used: Rolling walker (2 wheels)                    Ambulation/Gait Ambulation/Gait assistance: Min guard, Supervision Gait Distance (Feet): 25 Feet Assistive device: Rolling walker (2 wheels) Gait Pattern/deviations: Step-through pattern, Decreased stride length           Stairs             Wheelchair Mobility    Modified Rankin (Stroke Patients Only) Modified Rankin (Stroke Patients Only) Pre-Morbid Rankin Score: Moderate disability Modified Rankin: Moderate disability     Balance Overall balance assessment: Needs assistance Sitting-balance support: Feet supported Sitting balance-Leahy Scale: Good     Standing balance support: Bilateral upper extremity supported, During functional activity Standing balance-Leahy Scale: Fair                              Cognition Arousal/Alertness: Awake/alert Behavior During Therapy: WFL for tasks assessed/performed Overall Cognitive Status: Within Functional Limits for tasks assessed                                          Exercises      General Comments        Pertinent Vitals/Pain Pain Assessment Pain Assessment: Faces Faces Pain Scale: No hurt    Home Living                          Prior Function            PT Goals (current  goals can now be found in the care plan section) Acute Rehab PT Goals Patient Stated Goal: go home PT Goal Formulation: With patient Time For Goal Achievement: 11/11/22 Potential to Achieve Goals: Good Progress towards PT goals: Progressing toward goals    Frequency    Min 4X/week      PT Plan Current plan remains appropriate    Co-evaluation              AM-PAC PT "6 Clicks" Mobility   Outcome Measure  Help needed turning from your back to your side while in a flat bed without using bedrails?: None Help needed moving from lying on your back to sitting on the side of  a flat bed without using bedrails?: None Help needed moving to and from a bed to a chair (including a wheelchair)?: None Help needed standing up from a chair using your arms (e.g., wheelchair or bedside chair)?: None Help needed to walk in hospital room?: A Little Help needed climbing 3-5 steps with a railing? : A Little 6 Click Score: 22    End of Session   Activity Tolerance: Patient tolerated treatment well Patient left: in bed;with call bell/phone within reach;with bed alarm set Nurse Communication: Mobility status PT Visit Diagnosis: Difficulty in walking, not elsewhere classified (R26.2)     Time: 1610-9604 PT Time Calculation (min) (ACUTE ONLY): 26 min  Charges:  $Therapeutic Activity: 23-37 mins                     Lillia Pauls, PT, DPT Acute Rehabilitation Services Office [phone]    Norval Morton 10/30/2022, 1:33 PM

## 2022-10-30 NOTE — Progress Notes (Signed)
Modified Barium Swallow Study  Patient Details  Name: Christina Orozco MRN: [id number] Date of Birth: Nov 11, 1970  Today's Date: 10/30/2022  Modified Barium Swallow completed.  Full report located under Chart Review in the Imaging Section.  History of Present Illness Christina Orozco is a 52 yr old lady with 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 She reports that Wednesday 10/25/22 she was doing well - home health nurse checked BP, glucose both well controlled. Early Thursday 10/26/22 patient went to [facility] ED due to N/V and diarrhea. She was diagnosed with a stomach virus and went home. Friday, 10/27/22 she missed an appointment with her retinologist because her ride was late. She went shopping with her daughter and grandchildren and was doing well. Driving at she reports that precisely at 6:20 PM she developed double vision w/o headache. She presented to [facility] for evaluation. Head CT was negative. Teleneuro recommended admission to [facility] for stroke workup.  HEAD MRA :Positive for punctate acute right temporal lobe white matter  lacunar infarct. And two small subacute appearing white matter  infarcts in the Left corona radiata, near the left splenium. No  associated hemorrhage or mass effect.  2. 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 Conspicuous periventricular white matter involvement,  although chronic demyelinating disease is felt less likely.   Clinical Impression Pt demonstrated an overall functional oropharyngeal swallow. Orally, she was able to contain and control boluses transiting without difficulty. Her strength and ROM of swallow musculature with adequate. No aspiration but one episode of flash penetration with thin liquid using straw slightly entering laryngeal vestibule prior to full closure due to decreased timing and ejected during the  swallow. Thin liquid with straw was initiated in the pyriform sinuses inconsistently. No significant pharyngeal residue. Pill hesitated in valleculae briefly but was cleared with sponatneous subsequent swallow. Esophagus scanned and pill transited without difficulty through GE junction and there was no significant stasis observed. Recommend pt continue regular texture, thin liquids, pills with thin and multiple swallows, small sips and upright posture. Will follow briefly to reiterate strategy. Factors that may increase risk of adverse event in presence of aspiration Christina Orozco & Christina Orozco 2021):    Swallow Evaluation Recommendations Recommendations: PO diet PO Diet Recommendation: Regular;Thin liquids (Level 0) Liquid Administration via: Straw;Cup Medication Administration: Whole meds with liquid Supervision: Patient able to self-feed Swallowing strategies  : Slow rate;Small bites/sips Postural changes: Position pt fully upright for meals Oral care recommendations: Oral care BID (2x/day)      Christina Orozco 10/30/2022,9:44 AM

## 2022-10-30 NOTE — Progress Notes (Signed)
Pt resting comfortably on home CPAP. 

## 2022-10-30 NOTE — Plan of Care (Signed)
  Problem: Education: Goal: Knowledge of General Education information will improve Description Including pain rating scale, medication(s)/side effects and non-pharmacologic comfort measures Outcome: Progressing   

## 2022-10-30 NOTE — Progress Notes (Signed)
[facility]                                                                               [place], is a 52 y.o. female, DOB - [birth year], XBJ:[id number] Admit date - 10/27/2022    Outpatient Primary MD for the patient is Kerin Salen, PA-C  LOS - 0  days    Brief summary   52 yr old lady with hx of 9 CVAs last 08/22/22, CKD 4, PAD, DM, HTN, asthma, and OSA presented to [facility] with blurry vision of the right eye.  She was scheduled to see retina specialist last week but was unable to keep it.  She reports missing her meds occasionally.  She was admitted for acute CVA.    Assessment & Plan    Assessment and Plan:  Punctate acute right temporal lobe white matter lacunar infarct. And two small subacute appearing white matter infarcts in the Left corona radiata, near the left splenium. - continue with aspirin 81 BID, Brilinta 90 mg BID. Lipitor 80 mg daily,  MRA head and neck. LDL is 35, A1c is 6.3%.   Last echo on 3/24. Neurology on board.  Therapy evaluations ordered. SLP eval recommending regular diet.  Dizziness later today, will check repeat CT head.    Hypertension Well controlled.  Continue with norvasc 10 mg, coreg 12.5 mg BID.   Asthma, mild intermittent  Stable.    Acute on Chronic kidney disease (CKD), stage IV (severe) (HCC) with metabolic acidosis:  Creatinine worsened from 2.6 to 3.2.  Will start her on gentle hydration.    Hypokalemia:  Replaced. Repeat levels wnl.    Type 2 diabetes mellitus with chronic kidney disease, without long-term current use of insulin (HCC) Lat A1C 08/22/22 6.3%.  Recent Labs    10/30/22 0613 10/30/22 1223 10/30/22 1655  GLUCAP 117* 126* 107*    Started her on SSI, Semaglutide   Gastroesophageal reflux disease without esophagitis Stable, continue with PPI.    Anemia of chronic disease/ Normocytic anemia:  Baseline hemoglobin around 9.  Hemoglobin dropped from 9 to 7.9.   Check anemia panel, stool for occult blood .       Estimated body mass index is 25.48 kg/m as calculated from the following:   Height as of this encounter: 5\' 2"  (1.575 m).   Weight as of this encounter: 63.2 kg.  Code Status: full code.  DVT Prophylaxis:  Place and maintain sequential compression device Start: 10/28/22 0740 heparin injection 5,000 Units Start: 10/28/22 0600   Level of Care: Level of care: Telemetry Medical Family Communication: none at bedside.   Disposition Plan:     Remains inpatient appropriate:  pending   Procedures:  Echo   Consultants:   Neurology.   Antimicrobials:   Anti-infectives (From admission, onward)    None        Medications  Scheduled Meds:  amLODipine  10 mg Oral Daily   aspirin EC  81 mg Oral BID   atorvastatin  80 mg Oral QHS   carvedilol  12.5 mg Oral BID WC   fluticasone  2 spray Each Nare Daily   heparin  5,000 Units  Subcutaneous Q8H   insulin aspart  0-9 Units Subcutaneous TID WC   montelukast  10 mg Oral QHS   pantoprazole  40 mg Oral Daily   Semaglutide(0.25 or 0.5MG /DOS)  0.5 mg Subcutaneous See admin instructions   sertraline  50 mg Oral QHS   ticagrelor  90 mg Oral BID   Continuous Infusions: PRN Meds:.acetaminophen **OR** acetaminophen (TYLENOL) oral liquid 160 mg/5 mL **OR** acetaminophen, albuterol, labetalol, naphazoline-glycerin, ondansetron, senna-docusate    Subjective:   Makaelyn Rentz was seen and examined today.  Some dizziness earlier today.   Objective:   Vitals:   10/30/22 0534 10/30/22 0750 10/30/22 1220 10/30/22 1548  BP: (!) 142/73 (!) 141/74 (!) 162/81 (!) 145/71  Pulse: 76 78 81 83  Resp: 20 17 19 20   Temp: 98.3 F (36.8 C) 98.5 F (36.9 C) 98.4 F (36.9 C) 98.6 F (37 C)  TempSrc: Oral Oral Oral Oral  SpO2: 100% 100% 100% 100%  Weight:      Height:        Intake/Output Summary (Last 24 hours) at 10/30/2022 1758 Last data filed at 10/30/2022 0955 Gross per 24 hour   Intake 360 ml  Output --  Net 360 ml    Filed Weights   10/28/22 0135  Weight: 63.2 kg     Exam General exam: Appears calm and comfortable  Respiratory system: Clear to auscultation. Respiratory effort normal. Cardiovascular system: S1 & S2 heard, RRR. No JVD, Gastrointestinal system: Abdomen is nondistended, soft and nontender.  Central nervous system: Alert and oriented. Persistent blurry vision and dizziness  Extremities: Symmetric 5 x 5 power. Skin: No rashes, Psychiatry: anxious.      Data Reviewed:  I have personally reviewed following labs and imaging studies   CBC Lab Results  Component Value Date   WBC 6.1 10/30/2022   RBC 2.72 (L) 10/30/2022   HGB 7.9 (L) 10/30/2022   HCT 24.5 (L) 10/30/2022   MCV 90.1 10/30/2022   MCH 29.0 10/30/2022   PLT 174 10/30/2022   MCHC 32.2 10/30/2022   RDW 14.0 10/30/2022   LYMPHSABS 2.6 10/30/2022   MONOABS 0.4 10/30/2022   EOSABS 0.1 10/30/2022   BASOSABS 0.0 10/30/2022     Last metabolic panel Lab Results  Component Value Date   NA 139 10/30/2022   K 3.9 10/30/2022   CL 112 (H) 10/30/2022   CO2 19 (L) 10/30/2022   BUN 42 (H) 10/30/2022   CREATININE 3.22 (H) 10/30/2022   GLUCOSE 143 (H) 10/30/2022   GFRNONAA 17 (L) 10/30/2022   GFRAA 27 (L) 06/17/2020   CALCIUM 8.2 (L) 10/30/2022   PHOS 5.1 (H) 12/23/2021   PROT 7.4 10/27/2022   ALBUMIN 4.3 10/27/2022   LABGLOB 2.5 06/17/2020   AGRATIO 1.7 06/17/2020   BILITOT 0.3 10/27/2022   ALKPHOS 45 10/27/2022   AST 16 10/27/2022   ALT 15 10/27/2022   ANIONGAP 8 10/30/2022    CBG (last 3)  Recent Labs    10/30/22 0613 10/30/22 1223 10/30/22 1655  GLUCAP 117* 126* 107*       Coagulation Profile: Recent Labs  Lab 10/27/22 2158  INR 1.0      Radiology Studies: DG Swallowing Func-Speech Pathology  Result Date: 10/30/2022 Table formatting from the original result was not included. Modified Barium Swallow Study Patient Details Name: Kaylii Moronta  MRN: [id number] Date of Birth: 1971-04-14 Today's Date: 10/30/2022 HPI/PMH: HPI: Ms. Crespin is a 52 yr old lady with hx of 9 CVAs last 08/22/22, CKD 4,  PAD, DM, HTN, asthma, and OSA presented to [facility] with diplopia. She reports that Wednesday 10/25/22 she was doing well - home health nurse checked BP, glucose both well controlled. Early Thursday 10/26/22 patient went to [facility] ED due to N/V and diarrhea. She was diagnosed with a stomach virus and went home. Friday, 10/27/22 she missed an appointment with her retinologist because her ride was late. She went shopping with her daughter and grandchildren and was doing well. Driving at she reports that precisely at 6:20 PM she developed double vision w/o headache. She presented to [facility] for evaluation. Head CT was negative. Teleneuro recommended admission to [facility] for stroke workup.  HEAD MRA :Positive for punctate acute right temporal lobe white matter  lacunar infarct. And two small subacute appearing white matter  infarcts in the Left corona radiata, near the left splenium. No  associated hemorrhage or mass effect.  2. 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 Conspicuous periventricular white matter involvement,  although chronic demyelinating disease is felt less likely. Clinical Impression: Clinical Impression: Pt demonstrated an overall functional oropharyngeal swallow. Orally, she was able to contain and control boluses transiting without difficulty. Her strength and ROM of swallow musculature with adequate. No aspiration but one episode of flash penetration with thin liquid using straw slightly entering laryngeal vestibule prior to full closure due to decreased timing and ejected during the swallow. Thin liquid with straw was initiated in the pyriform sinuses inconsistently. No significant pharyngeal residue. Pill hesitated in valleculae briefly but was cleared with  sponatneous subsequent swallow. Esophagus scanned and pill transited without difficulty through GE junction and there was no significant stasis observed. Recommend pt continue regular texture, thin liquids, pills with thin and multiple swallows, small sips and upright posture. Will follow briefly to reiterate strategy. Factors that may increase risk of adverse event in presence of aspiration Rubye Oaks [employer] Clearance Coots 2021): No data recorded Recommendations/Plan: Swallowing Evaluation Recommendations Swallowing Evaluation Recommendations Recommendations: PO diet PO Diet Recommendation: Regular; Thin liquids (Level 0) Liquid Administration via: Straw; Cup Medication Administration: Whole meds with liquid Supervision: Patient able to self-feed Swallowing strategies  : Slow rate; Small bites/sips Postural changes: Position pt fully upright for meals Oral care recommendations: Oral care BID (2x/day) Treatment Plan Treatment Plan Treatment recommendations: Therapy as outlined in treatment plan below Follow-up recommendations: No SLP follow up Functional status assessment: Patient has had a recent decline in their functional status and demonstrates the ability to make significant improvements in function in a reasonable and predictable amount of time. Treatment frequency: Min 2x/week Treatment duration: 2 weeks Interventions: Diet toleration management by SLP Recommendations Recommendations for follow up therapy are one component of a multi-disciplinary discharge planning process, led by the attending physician.  Recommendations may be updated based on patient status, additional functional criteria and insurance authorization. Assessment: Orofacial Exam: Orofacial Exam Oral Cavity: Oral Hygiene: WFL Oral Cavity - Dentition: Adequate natural dentition Orofacial Anatomy: WFL Oral Motor/Sensory Function: WFL Anatomy: Anatomy: Presence of cervical hardware Boluses Administered: Boluses Administered Boluses Administered: Thin liquids  (Level 0); Mildly thick liquids (Level 2, nectar thick); Moderately thick liquids (Level 3, honey thick); Puree; Solid  Oral Impairment Domain: Oral Impairment Domain Lip Closure: No labial escape Tongue control during bolus hold: Cohesive bolus between tongue to palatal seal Bolus preparation/mastication: Timely and efficient chewing and mashing Bolus transport/lingual motion: Brisk tongue motion Oral residue: Complete oral clearance Location of oral residue : N/A Initiation of  pharyngeal swallow : Pyriform sinuses (wit straw with thin)  Pharyngeal Impairment Domain: Pharyngeal Impairment Domain Soft palate elevation: No bolus between soft palate (SP)/pharyngeal wall (PW) Laryngeal elevation: Complete superior movement of thyroid cartilage with complete approximation of arytenoids to epiglottic petiole Anterior hyoid excursion: Complete anterior movement Epiglottic movement: Complete inversion Laryngeal vestibule closure: Complete, no air/contrast in laryngeal vestibule Pharyngeal stripping wave : Present - complete Pharyngeal contraction (A/P view only): N/A Pharyngoesophageal segment opening: Complete distension and complete duration, no obstruction of flow Tongue base retraction: No contrast between tongue base and posterior pharyngeal wall (PPW) Pharyngeal residue: Complete pharyngeal clearance Location of pharyngeal residue: Pyriform sinuses (with straw)  Esophageal Impairment Domain: Esophageal Impairment Domain Esophageal clearance upright position: Complete clearance, esophageal coating Pill: Esophageal Impairment Domain Esophageal clearance upright position: Complete clearance, esophageal coating Penetration/Aspiration Scale Score: Penetration/Aspiration Scale Score 2.  Material enters airway, remains ABOVE vocal cords then ejected out: Thin liquids (Level 0) Compensatory Strategies: No data recorded  General Information: Caregiver present: No  Diet Prior to this Study: Regular; Thin liquids (Level 0)    Temperature : Normal   Respiratory Status: WFL   Supplemental O2: None (Room air)   History of Recent Intubation: No  Behavior/Cognition: Alert; Cooperative; Pleasant mood Self-Feeding Abilities: Able to self-feed Baseline vocal quality/speech: Normal No data recorded Volitional Swallow: Able to elicit No data recorded Goal Planning: Prognosis for improved oropharyngeal function: Good No data recorded No data recorded Patient/Family Stated Goal: d/c soon Consulted and agree with results and recommendations: Patient Pain: Pain Assessment Pain Assessment: No/denies pain Pain Score: 5 Faces Pain Scale: 0 Pain Location: dizziness Pain Descriptors / Indicators: Discomfort Pain Intervention(s): Monitored during session; Limited activity within patient's tolerance End of Session: Start Time:SLP Start Time (ACUTE ONLY): 0855 Stop Time: SLP Stop Time (ACUTE ONLY): 0905 Time Calculation:SLP Time Calculation (min) (ACUTE ONLY): 10 min Charges: SLP Evaluations $ SLP Speech Visit: 1 Visit SLP Evaluations $BSS Swallow: 1 Procedure $MBS Swallow: 1 Procedure $ SLP EVAL LANGUAGE/SOUND PRODUCTION: 1 Procedure $Swallowing Treatment: 1 Procedure SLP visit diagnosis: SLP Visit Diagnosis: Dysphagia, pharyngeal phase (R13.13) Past Medical History: Past Medical History: Diagnosis Date  Anemia   severe  Anxiety   Asthma   CKD (chronic kidney disease)   stage IV  DDD (degenerative disc disease), cervical   Depression   Diabetes mellitus (HCC)   Dislocated shoulder   right  GERD (gastroesophageal reflux disease)   Headache   migraines (about once a month)  History of kidney stones   Hypertension   Neuropathy   Peripheral vascular disease (HCC)   Pneumonia   Stroke [facility])  Past Surgical History: Past Surgical History: Procedure Laterality Date  ADENOIDECTOMY    APPENDECTOMY    CERVICAL SPINE SURGERY  06/2012  C5-C6  EYE SURGERY    left eye surgery   FOOT SURGERY    KENALOG INJECTION Left 03/10/2021  Procedure: Intravitreal Avastin Injection;   Surgeon: Stephannie Li, MD;  Location: [facility] OR;  Service: Ophthalmology;  Laterality: Left;  LOOP RECORDER INSERTION N/A 08/27/2017  Procedure: LOOP RECORDER INSERTION;  Surgeon: Thurmon Fair, MD;  Location: [facility] INVASIVE CV LAB;  Service: Cardiovascular;  Laterality: N/A;  MEMBRANE PEEL Left 03/10/2021  Procedure: MEMBRANE PEEL;  Surgeon: Stephannie Li, MD;  Location: [facility] OR;  Service: Ophthalmology;  Laterality: Left;  PARS PLANA VITRECTOMY Left 03/10/2021  Procedure: TWENTY-FIVE GAUGE PARS PLANA VITRECTOMY;  Surgeon: Stephannie Li, MD;  Location[facility] OR;  Service: Ophthalmology;  Laterality: Left;  PHOTOCOAGULATION WITH LASER  Left 03/10/2021  Procedure: PHOTOCOAGULATION WITH LASER;  Surgeon: Stephannie Li, MD;  Location: [facility] OR;  Service: Ophthalmology;  Laterality: Left;  SHOULDER SURGERY    TEE WITHOUT CARDIOVERSION N/A 08/27/2017  Procedure: TRANSESOPHAGEAL ECHOCARDIOGRAM (TEE);  Surgeon: Thurmon Fair, MD;  Location: [facility] ENDOSCOPY;  Service: Cardiovascular;  Laterality: N/A;  TONSILLECTOMY   Royce Macadamia 10/30/2022, 9:43 AM      Kathlen Mody M.D. [facility] 10/30/2022, 5:58 PM  Available via Epic secure chat 7am-7pm After 7 pm, please refer to night coverage provider listed on amion.

## 2022-10-31 DIAGNOSIS — H0520 Unspecified exophthalmos: Secondary | ICD-10-CM | POA: Diagnosis present

## 2022-10-31 DIAGNOSIS — D631 Anemia in chronic kidney disease: Secondary | ICD-10-CM | POA: Diagnosis present

## 2022-10-31 DIAGNOSIS — Z8249 Family history of ischemic heart disease and other diseases of the circulatory system: Secondary | ICD-10-CM | POA: Diagnosis not present

## 2022-10-31 DIAGNOSIS — E785 Hyperlipidemia, unspecified: Secondary | ICD-10-CM | POA: Diagnosis present

## 2022-10-31 DIAGNOSIS — H532 Diplopia: Secondary | ICD-10-CM

## 2022-10-31 DIAGNOSIS — G4733 Obstructive sleep apnea (adult) (pediatric): Secondary | ICD-10-CM | POA: Diagnosis present

## 2022-10-31 DIAGNOSIS — Z79899 Other long term (current) drug therapy: Secondary | ICD-10-CM | POA: Diagnosis not present

## 2022-10-31 DIAGNOSIS — E872 Acidosis, unspecified: Secondary | ICD-10-CM | POA: Diagnosis not present

## 2022-10-31 DIAGNOSIS — J4520 Mild intermittent asthma, uncomplicated: Secondary | ICD-10-CM | POA: Diagnosis present

## 2022-10-31 DIAGNOSIS — K219 Gastro-esophageal reflux disease without esophagitis: Secondary | ICD-10-CM | POA: Diagnosis not present

## 2022-10-31 DIAGNOSIS — Z880 Allergy status to penicillin: Secondary | ICD-10-CM | POA: Diagnosis not present

## 2022-10-31 DIAGNOSIS — G40909 Epilepsy, unspecified, not intractable, without status epilepticus: Secondary | ICD-10-CM | POA: Diagnosis present

## 2022-10-31 DIAGNOSIS — E1151 Type 2 diabetes mellitus with diabetic peripheral angiopathy without gangrene: Secondary | ICD-10-CM | POA: Diagnosis present

## 2022-10-31 DIAGNOSIS — Z7902 Long term (current) use of antithrombotics/antiplatelets: Secondary | ICD-10-CM | POA: Diagnosis not present

## 2022-10-31 DIAGNOSIS — N184 Chronic kidney disease, stage 4 (severe): Secondary | ICD-10-CM | POA: Diagnosis present

## 2022-10-31 DIAGNOSIS — E1122 Type 2 diabetes mellitus with diabetic chronic kidney disease: Secondary | ICD-10-CM | POA: Diagnosis not present

## 2022-10-31 DIAGNOSIS — Z888 Allergy status to other drugs, medicaments and biological substances status: Secondary | ICD-10-CM | POA: Diagnosis not present

## 2022-10-31 DIAGNOSIS — I1310 Hypertensive heart and chronic kidney disease without heart failure, with stage 1 through stage 4 chronic kidney disease, or unspecified chronic kidney disease: Secondary | ICD-10-CM | POA: Diagnosis present

## 2022-10-31 DIAGNOSIS — E1136 Type 2 diabetes mellitus with diabetic cataract: Secondary | ICD-10-CM | POA: Diagnosis present

## 2022-10-31 DIAGNOSIS — I6381 Other cerebral infarction due to occlusion or stenosis of small artery: Secondary | ICD-10-CM | POA: Diagnosis present

## 2022-10-31 DIAGNOSIS — E876 Hypokalemia: Secondary | ICD-10-CM | POA: Diagnosis present

## 2022-10-31 DIAGNOSIS — E1140 Type 2 diabetes mellitus with diabetic neuropathy, unspecified: Secondary | ICD-10-CM | POA: Diagnosis present

## 2022-10-31 DIAGNOSIS — I69354 Hemiplegia and hemiparesis following cerebral infarction affecting left non-dominant side: Secondary | ICD-10-CM | POA: Diagnosis not present

## 2022-10-31 DIAGNOSIS — I10 Essential (primary) hypertension: Secondary | ICD-10-CM | POA: Diagnosis not present

## 2022-10-31 DIAGNOSIS — I6783 Posterior reversible encephalopathy syndrome: Secondary | ICD-10-CM | POA: Diagnosis present

## 2022-10-31 LAB — BASIC METABOLIC PANEL
Anion gap: 8 (ref 5–15)
BUN: 53 mg/dL — ABNORMAL HIGH (ref 6–20)
CO2: 18 mmol/L — ABNORMAL LOW (ref 22–32)
Calcium: 8.8 mg/dL — ABNORMAL LOW (ref 8.9–10.3)
Chloride: 113 mmol/L — ABNORMAL HIGH (ref 98–111)
Creatinine, Ser: 2.89 mg/dL — ABNORMAL HIGH (ref 0.44–1.00)
GFR, Estimated: 19 mL/min — ABNORMAL LOW (ref >60)
Glucose, Bld: 170 mg/dL — ABNORMAL HIGH (ref 70–99)
Potassium: 4 mmol/L (ref 3.5–5.1)
Sodium: 139 mmol/L (ref 135–145)

## 2022-10-31 LAB — GLUCOSE, CAPILLARY
Glucose-Capillary: 123 mg/dL — ABNORMAL HIGH (ref 70–99)
Glucose-Capillary: 147 mg/dL — ABNORMAL HIGH (ref 70–99)

## 2022-10-31 LAB — CBC WITH DIFFERENTIAL/PLATELET
Abs Immature Granulocytes: 0.02 10*3/uL (ref 0.00–0.07)
Basophils Absolute: 0 10*3/uL (ref 0.0–0.1)
Basophils Relative: 1 %
Eosinophils Absolute: 0.1 10*3/uL (ref 0.0–0.5)
Eosinophils Relative: 2 %
HCT: 26.6 % — ABNORMAL LOW (ref 36.0–46.0)
Hemoglobin: 8.6 g/dL — ABNORMAL LOW (ref 12.0–15.0)
Immature Granulocytes: 0 %
Lymphocytes Relative: 31 %
Lymphs Abs: 1.9 10*3/uL (ref 0.7–4.0)
MCH: 28.7 pg (ref 26.0–34.0)
MCHC: 32.3 g/dL (ref 30.0–36.0)
MCV: 88.7 fL (ref 80.0–100.0)
Monocytes Absolute: 0.4 10*3/uL (ref 0.1–1.0)
Monocytes Relative: 6 %
Neutro Abs: 3.8 10*3/uL (ref 1.7–7.7)
Neutrophils Relative %: 60 %
Platelets: 191 10*3/uL (ref 150–400)
RBC: 3 MIL/uL — ABNORMAL LOW (ref 3.87–5.11)
RDW: 14.1 % (ref 11.5–15.5)
WBC: 6.2 10*3/uL (ref 4.0–10.5)
nRBC: 0 % (ref 0.0–0.2)

## 2022-10-31 MED ORDER — ORAL CARE MOUTH RINSE: 15.0000 mL | OROMUCOSAL | Status: DC | PRN

## 2022-10-31 NOTE — Plan of Care (Signed)

## 2022-10-31 NOTE — Progress Notes (Signed)
Speech Language Pathology Treatment: Dysphagia  Patient Details Name: Aamiyah Vehrs MRN: [id number] DOB: [birth year] Today's Date: 10/31/2022 Time: 4098-1191 SLP Time Calculation (min) (ACUTE ONLY): 11 min  Assessment / Plan / Recommendation Clinical Impression  Pt seen after MBS yesterday and eating breakfast when therapist arrived of regular texture sitting in upright position. Mastication, transit and clearance all appropriate. She did have one slight cough after initial sip from water bottle but no difficulty with subsequent sips. She states difficulty taking pills with water and pt agreeable to have pills whole in puree for improved oral control and easier transit. Recommend continue regular texture, thin liquids, pills whole in puree. During MBS she only had flash penetration and may have times when she coughs with liquids but has good sensation. She is aware of strategies and is implementing. No further ST needed at this time and pt in agreement.    HPI HPI: Ms. 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 She reports that Wednesday 10/25/22 she was doing well - home health nurse checked BP, glucose both well controlled. Early Thursday 10/26/22 patient went to [facility] ED due to N/V and diarrhea. She was diagnosed with a stomach virus and went home. Friday, 10/27/22 she missed an appointment with her retinologist because her ride was late. She went shopping with her daughter and grandchildren and was doing well. Driving at she reports that precisely at 6:20 PM she developed double vision w/o headache. She presented to [facility] for evaluation. Head CT was negative. Teleneuro recommended admission to [facility] for stroke workup.  HEAD MRA :Positive for punctate acute right temporal lobe white matter  lacunar infarct. And two small subacute appearing white matter  infarcts in the Left corona radiata, near the left splenium. No   associated hemorrhage or mass effect.  2. 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 Conspicuous periventricular white matter involvement,  although chronic demyelinating disease is felt less likely.      SLP Plan  All goals met;Discharge SLP treatment due to (comment)      Recommendations for follow up therapy are one component of a multi-disciplinary discharge planning process, led by the attending physician.  Recommendations may be updated based on patient status, additional functional criteria and insurance authorization.    Recommendations  Diet recommendations: Regular;Thin liquid Liquids provided via: Cup;Straw Medication Administration: Whole meds with puree Supervision: Patient able to self feed Compensations: Slow rate;Small sips/bites Postural Changes and/or Swallow Maneuvers: Seated upright 90 degrees;Upright 30-60 min after meal                  Oral care BID   None Dysphagia, pharyngeal phase (R13.13)     All goals met;Discharge SLP treatment due to (comment)     Royce Macadamia  10/31/2022, 8:29 AM

## 2022-10-31 NOTE — TOC Transition Note (Signed)
Transition of Care [facility]) - CM/SW Discharge Note   Patient Details  Name: Christina Orozco MRN: [id number] Date of Birth: Jan 15, 1971  [facility] CM/SW Contact:  Kermit Balo, RN Phone Number: 10/31/2022, 1:03 PM   Clinical Narrative:    Pt is discharging home today with a resumption of home health services through [facility]. Information on the AVS.  Pt has transportation home today.   Final next level of care: Home w Home Health Services Barriers to Discharge: No Barriers Identified   Patient Goals and CMS Choice CMS Medicare.gov Compare Post Acute Care list provided to:: Patient Choice offered to / list presented to : Patient  Discharge Placement                         Discharge Plan and Services Additional resources added to the After Visit Summary for     Discharge Planning Services: CM Consult Post Acute Care Choice: Home Health, Durable Medical Equipment          DME Arranged: Bedside commode DME Agency: [employer] Date DME Agency Contacted: 10/29/22 Time DME Agency Contacted: [phone] Representative spoke with at DME Agency: Vaughan Basta HH Arranged: PT, OT, Speech Therapy HH Agency: [facility] Date [facility] Agency Contacted: 10/31/22   Representative spoke with at [facility] Agency: Tresa Endo  Social Determinants of Health (SDOH) Interventions SDOH Screenings   Food Insecurity: No Food Insecurity (10/28/2022)  Housing: Low Risk  (10/28/2022)  Transportation Needs: No Transportation Needs (10/28/2022)  Utilities: Not At Risk (10/28/2022)  Depression (PHQ2-9): Low Risk  (10/10/2022)  Tobacco Use: Low Risk  (10/28/2022)     Readmission Risk Interventions     No data to display

## 2022-10-31 NOTE — Discharge Summary (Signed)
Physician Discharge Summary   Patient: Christina Orozco MRN: [id number] DOB: 1971/05/12  Admit date:     10/27/2022  Discharge date: {dischdate:26783}  Discharge Physician: Kathlen Mody   PCP: Kerin Salen, PA-C   Recommendations at discharge:  {Tip this will not be part of the note when signed- Example include specific recommendations for outpatient follow-up, pending tests to follow-up on. (Optional):26781}  ***  Discharge Diagnoses: Active Problems:   OSA (obstructive sleep apnea)   Asthma, mild intermittent   Hypertension   Chronic kidney disease (CKD), stage IV (severe) (HCC)   Gastroesophageal reflux disease without esophagitis   Type 2 diabetes mellitus with chronic kidney disease, without long-term current use of insulin (HCC)   Vision abnormalities  Resolved Problems:   * No resolved hospital problems. [facility] Course: No notes on file  Assessment and Plan: OSA (obstructive sleep apnea) Patient reports using CPAP at night. She tolerates this well.  Plan Continue CPAP-respiratory to follow  Hypertension BP mildly elevated at admission. By her report BP has been well controlled at home. At last Cardiology OV she was advised to increase coreg to 25 mg BID but she was concerned for possible hypotension and continued at previous dose of coreg 12.5 mg BID and continued her other medications.  Plan Continue home regimen  Hydralazine prn ordered by neuro/receiving MD  Asthma, mild intermittent Stable w/o respiratory symptoms.  Plan Continue home regimen  Chronic kidney disease (CKD), stage IV (severe) (HCC) Patient follows with nephrology. Her creatinine is stable at this time.  Plan Continue home medications  Avoid nephrotoxic medications, dyes  Type 2 diabetes mellitus with chronic kidney disease, without long-term current use of insulin (HCC) Lat A1C 08/22/22 6.3%. CBGs at home have been well controlled.  Plan Continue home  regimen  Gastroesophageal reflux disease without esophagitis Stable with no active complaints.  Plan Continue PPI  Vision abnormalities Patient with diploplia. 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 Patient with h/o small vessel disease brain as source of previous strokes with no evidence on prior vascular studies, most recently 08/22/22, of large vessel occlusion. She has been adherent with taking ASA and Brilinta. She reports no change in motor strength, coordination, sensation.  Plan Repeat MR imaging pending  Cancelled echo - last study 08/23/22 with no source of embolism  Risk factors well controlled with excellent lipid management as of 08/23/22, diabetes management as of 08/22/22.  Neurology to follow.       {Tip this will not be part of the note when signed Body mass index is 25.48 kg/m. , ,  (Optional):26781}  {(NOTE) Pain control PDMP Statment (Optional):26782} Consultants: *** Procedures performed: ***  Disposition: {Plan; Disposition:26390} Diet recommendation:  Discharge Diet Orders (From admission, onward)     Start     Ordered   10/31/22 0000  Diet - low sodium heart healthy        10/31/22 1214           {Diet_Plan:26776} DISCHARGE MEDICATION: Allergies as of 10/31/2022       Reactions   Lisinopril Swelling, Other (See Comments)   Facial swelling  angioedema   Penicillins Hives   Has patient had a PCN reaction causing immediate rash, facial/tongue/throat swelling, SOB or lightheadedness with hypotension: yes Has patient had a PCN reaction causing severe rash involving mucus membranes or skin necrosis: No Has patient had a PCN reaction that required  hospitalization No Has patient had a PCN reaction occurring within the last 10 years: No If all of the above answers are "NO", then may proceed wit Rash on buttock that makes sitting very difficult   Quetiapine Other (See  Comments)   Unknown    Gabapentin Swelling, Other (See Comments)   "I thought I was having another stroke." dizziness        Medication List     STOP taking these medications    glucose blood test strip Commonly known as: Contour Next Test   Medical Compression Stockings Misc   sertraline 50 MG tablet Commonly known as: ZOLOFT       TAKE these medications    albuterol 108 (90 Base) MCG/ACT inhaler Commonly known as: ProAir HFA INHALE 1 OR 2 PUFFS INTO THE LUNGS EVERY 6 HOURS AS NEEDED FOR WHEEZING OR SHORTNESS OF BREATH   amLODipine 10 MG tablet Commonly known as: NORVASC Take 1 tablet (10 mg total) by mouth daily.   aspirin EC 81 MG tablet Take 81 mg by mouth 2 (two) times daily. Swallow whole.   atorvastatin 80 MG tablet Commonly known as: LIPITOR Take 1 tablet (80 mg total) by mouth at bedtime.   carvedilol 12.5 MG tablet Commonly known as: COREG Take 1 tablet (12.5 mg total) by mouth 2 (two) times daily with a meal.   fluticasone 50 MCG/ACT nasal spray Commonly known as: FLONASE Place 2 sprays into both nostrils daily.   montelukast 10 MG tablet Commonly known as: SINGULAIR Take 10 mg by mouth at bedtime.   ondansetron 4 MG disintegrating tablet Commonly known as: ZOFRAN-ODT Take 1 tablet (4 mg total) by mouth every 8 (eight) hours as needed for nausea.   Ozempic (0.25 or 0.5 MG/DOSE) 2 MG/3ML Sopn Generic drug: Semaglutide(0.25 or 0.5MG /DOS) Inject 0.5 mg into the skin See admin instructions. Inject 0.5 mg subcutaneous every 10 days   pantoprazole 40 MG tablet Commonly known as: PROTONIX Take 1 tablet (40 mg total) by mouth daily.   ticagrelor 90 MG Tabs tablet Commonly known as: BRILINTA Take 90 mg by mouth 2 (two) times daily.               Durable Medical Equipment  (From admission, onward)           Start     Ordered   10/29/22 0915  For home use only DME Bedside commode  Once       Question:  Patient needs a bedside  commode to treat with the following condition  Answer:  Weakness   10/29/22 0914            Discharge Exam: Filed Weights   10/28/22 0135  Weight: 63.2 kg   ***  Condition at discharge: {DC Condition:26389}  The results of significant diagnostics from this hospitalization (including imaging, microbiology, ancillary and laboratory) are listed below for reference.   Imaging Studies: CT HEAD WO CONTRAST ( )  Result Date: 10/30/2022 CLINICAL DATA:  Stroke, follow-up.  Difficulty seeing. EXAM: CT HEAD WITHOUT CONTRAST TECHNIQUE: Contiguous axial images were obtained from the base of the skull through the vertex without intravenous contrast. RADIATION DOSE REDUCTION: This exam was performed according to the departmental dose-optimization program which includes automated exposure control, adjustment of the mA and/or kV according to patient size and/or use of iterative reconstruction technique. COMPARISON:  MRI brain 10/28/2022.  Head CT 10/27/2022. FINDINGS: Brain: No acute hemorrhage. Acute infarcts seen on the previous day's MRI brain are not resolved  by CT. Unchanged chronic small-vessel disease and old lacunar infarcts in the bilateral basal ganglia and thalami. Cortical gray-white differentiation is otherwise preserved. Prominence of the ventricles and sulci within expected range for age. No hydrocephalus or extra-axial collection. No mass effect or midline shift. Vascular: No hyperdense vessel or unexpected calcification. Skull: No calvarial fracture or suspicious bone lesion. Skull base is unremarkable. Sinuses/Orbits: Unremarkable. Other: None. IMPRESSION: 1. No acute hemorrhage. 2. Unchanged chronic small-vessel disease and old lacunar infarcts. Electronically Signed   By: Orvan Falconer M.D.   On: 10/30/2022 20:41   DG Swallowing Func-Speech Pathology  Result Date: 10/30/2022 Table formatting from the original result was not included. Modified Barium Swallow Study Patient Details  Name: Christina Orozco MRN: [id number] Date of Birth: September 16, 1970 Today's Date: 10/30/2022 HPI/PMH: HPI: Ms. 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 She reports that Wednesday 10/25/22 she was doing well - home health nurse checked BP, glucose both well controlled. Early Thursday 10/26/22 patient went to [facility] ED due to N/V and diarrhea. She was diagnosed with a stomach virus and went home. Friday, 10/27/22 she missed an appointment with her retinologist because her ride was late. She went shopping with her daughter and grandchildren and was doing well. Driving at she reports that precisely at 6:20 PM she developed double vision w/o headache. She presented to [facility] for evaluation. Head CT was negative. Teleneuro recommended admission to [facility] for stroke workup.  HEAD MRA :Positive for punctate acute right temporal lobe white matter  lacunar infarct. And two small subacute appearing white matter  infarcts in the Left corona radiata, near the left splenium. No  associated hemorrhage or mass effect.  2. 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 Conspicuous periventricular white matter involvement,  although chronic demyelinating disease is felt less likely. Clinical Impression: Clinical Impression: Pt demonstrated an overall functional oropharyngeal swallow. Orally, she was able to contain and control boluses transiting without difficulty. Her strength and ROM of swallow musculature with adequate. No aspiration but one episode of flash penetration with thin liquid using straw slightly entering laryngeal vestibule prior to full closure due to decreased timing and ejected during the swallow. Thin liquid with straw was initiated in the pyriform sinuses inconsistently. No significant pharyngeal residue. Pill hesitated in valleculae briefly but  was cleared with sponatneous subsequent swallow. Esophagus scanned and pill transited without difficulty through GE junction and there was no significant stasis observed. Recommend pt continue regular texture, thin liquids, pills with thin and multiple swallows, small sips and upright posture. Will follow briefly to reiterate strategy. Factors that may increase risk of adverse event in presence of aspiration Rubye Oaks [employer] Clearance Coots 2021): No data recorded Recommendations/Plan: Swallowing Evaluation Recommendations Swallowing Evaluation Recommendations Recommendations: PO diet PO Diet Recommendation: Regular; Thin liquids (Level 0) Liquid Administration via: Straw; Cup Medication Administration: Whole meds with liquid Supervision: Patient able to self-feed Swallowing strategies  : Slow rate; Small bites/sips Postural changes: Position pt fully upright for meals Oral care recommendations: Oral care BID (2x/day) Treatment Plan Treatment Plan Treatment recommendations: Therapy as outlined in treatment plan below Follow-up recommendations: No SLP follow up Functional status assessment: Patient has had a recent decline in their functional status and demonstrates the ability to make significant improvements in function in a reasonable and predictable amount of time. Treatment frequency: Min 2x/week  Treatment duration: 2 weeks Interventions: Diet toleration management by SLP Recommendations Recommendations for follow up therapy are one component of a multi-disciplinary discharge planning process, led by the attending physician.  Recommendations may be updated based on patient status, additional functional criteria and insurance authorization. Assessment: Orofacial Exam: Orofacial Exam Oral Cavity: Oral Hygiene: WFL Oral Cavity - Dentition: Adequate natural dentition Orofacial Anatomy: WFL Oral Motor/Sensory Function: WFL Anatomy: Anatomy: Presence of cervical hardware Boluses Administered: Boluses Administered Boluses  Administered: Thin liquids (Level 0); Mildly thick liquids (Level 2, nectar thick); Moderately thick liquids (Level 3, honey thick); Puree; Solid  Oral Impairment Domain: Oral Impairment Domain Lip Closure: No labial escape Tongue control during bolus hold: Cohesive bolus between tongue to palatal seal Bolus preparation/mastication: Timely and efficient chewing and mashing Bolus transport/lingual motion: Brisk tongue motion Oral residue: Complete oral clearance Location of oral residue : N/A Initiation of pharyngeal swallow : Pyriform sinuses (wit straw with thin)  Pharyngeal Impairment Domain: Pharyngeal Impairment Domain Soft palate elevation: No bolus between soft palate (SP)/pharyngeal wall (PW) Laryngeal elevation: Complete superior movement of thyroid cartilage with complete approximation of arytenoids to epiglottic petiole Anterior hyoid excursion: Complete anterior movement Epiglottic movement: Complete inversion Laryngeal vestibule closure: Complete, no air/contrast in laryngeal vestibule Pharyngeal stripping wave : Present - complete Pharyngeal contraction (A/P view only): N/A Pharyngoesophageal segment opening: Complete distension and complete duration, no obstruction of flow Tongue base retraction: No contrast between tongue base and posterior pharyngeal wall (PPW) Pharyngeal residue: Complete pharyngeal clearance Location of pharyngeal residue: Pyriform sinuses (with straw)  Esophageal Impairment Domain: Esophageal Impairment Domain Esophageal clearance upright position: Complete clearance, esophageal coating Pill: Esophageal Impairment Domain Esophageal clearance upright position: Complete clearance, esophageal coating Penetration/Aspiration Scale Score: Penetration/Aspiration Scale Score 2.  Material enters airway, remains ABOVE vocal cords then ejected out: Thin liquids (Level 0) Compensatory Strategies: No data recorded  General Information: Caregiver present: No  Diet Prior to this Study: Regular;  Thin liquids (Level 0)   Temperature : Normal   Respiratory Status: WFL   Supplemental O2: None (Room air)   History of Recent Intubation: No  Behavior/Cognition: Alert; Cooperative; Pleasant mood Self-Feeding Abilities: Able to self-feed Baseline vocal quality/speech: Normal No data recorded Volitional Swallow: Able to elicit No data recorded Goal Planning: Prognosis for improved oropharyngeal function: Good No data recorded No data recorded Patient/Family Stated Goal: d/c soon Consulted and agree with results and recommendations: Patient Pain: Pain Assessment Pain Assessment: No/denies pain Pain Score: 5 Faces Pain Scale: 0 Pain Location: dizziness Pain Descriptors / Indicators: Discomfort Pain Intervention(s): Monitored during session; Limited activity within patient's tolerance End of Session: Start Time:SLP Start Time (ACUTE ONLY): 0855 Stop Time: SLP Stop Time (ACUTE ONLY): 0905 Time Calculation:SLP Time Calculation (min) (ACUTE ONLY): 10 min Charges: SLP Evaluations $ SLP Speech Visit: 1 Visit SLP Evaluations $BSS Swallow: 1 Procedure $MBS Swallow: 1 Procedure $ SLP EVAL LANGUAGE/SOUND PRODUCTION: 1 Procedure $Swallowing Treatment: 1 Procedure SLP visit diagnosis: SLP Visit Diagnosis: Dysphagia, pharyngeal phase (R13.13) Past Medical History: Past Medical History: Diagnosis Date  Anemia   severe  Anxiety   Asthma   CKD (chronic kidney disease)   stage IV  DDD (degenerative disc disease), cervical   Depression   Diabetes mellitus (HCC)   Dislocated shoulder   right  GERD (gastroesophageal reflux disease)   Headache   migraines (about once a month)  History of kidney stones   Hypertension   Neuropathy   Peripheral vascular disease (HCC)   Pneumonia  Stroke [facility])  Past Surgical History: Past Surgical History: Procedure Laterality Date  ADENOIDECTOMY    APPENDECTOMY    CERVICAL SPINE SURGERY  06/2012  C5-C6  EYE SURGERY    left eye surgery   FOOT SURGERY    KENALOG INJECTION Left 03/10/2021  Procedure:  Intravitreal Avastin Injection;  Surgeon: Stephannie Li, MD;  Location: [facility] OR;  Service: Ophthalmology;  Laterality: Left;  LOOP RECORDER INSERTION N/A 08/27/2017  Procedure: LOOP RECORDER INSERTION;  Surgeon: Thurmon Fair, MD;  Location: [facility] INVASIVE CV LAB;  Service: Cardiovascular;  Laterality: N/A;  MEMBRANE PEEL Left 03/10/2021  Procedure: MEMBRANE PEEL;  Surgeon: Stephannie Li, MD;  Location: [facility] OR;  Service: Ophthalmology;  Laterality: Left;  PARS PLANA VITRECTOMY Left 03/10/2021  Procedure: TWENTY-FIVE GAUGE PARS PLANA VITRECTOMY;  Surgeon: Stephannie Li, MD;  Location: [facility] OR;  Service: Ophthalmology;  Laterality: Left;  PHOTOCOAGULATION WITH LASER Left 03/10/2021  Procedure: PHOTOCOAGULATION WITH LASER;  Surgeon: Stephannie Li, MD;  Location[facility] OR;  Service: Ophthalmology;  Laterality: Left;  SHOULDER SURGERY    TEE WITHOUT CARDIOVERSION N/A 08/27/2017  Procedure: TRANSESOPHAGEAL ECHOCARDIOGRAM (TEE);  Surgeon: Thurmon Fair, MD;  Location: [facility] ENDOSCOPY;  Service: Cardiovascular;  Laterality: N/A;  TONSILLECTOMY   Royce Macadamia 10/30/2022, 9:43 AM  MR ANGIO HEAD WO CONTRAST  Result Date: 10/28/2022 CLINICAL DATA:  52 year old female code stroke presentation yesterday. Advanced acute on chronic small vessel disease on MRI. EXAM: MRA HEAD WITHOUT CONTRAST TECHNIQUE: Angiographic images of the Circle of Willis were acquired using MRA technique without intravenous contrast. COMPARISON:  Brain MRI today. CTA head and neck 08/22/2022. Intracranial MRA 06/21/2022, and earlier. FINDINGS: Mildly motion degraded today. Anterior circulation: Antegrade flow in both ICA siphons appears stable from last year. No siphon stenosis. Ophthalmic and left posterior communicating artery origins appear normal. Patent carotid termini. MCA and ACA origins appear normal. Diminutive anterior communicating artery. Visible ACA branches are stable and within normal limits. MCA M1 segments and bi/trifurcations remain patent  without stenosis. Visible bilateral MCA branches are stable and within normal limits. Posterior circulation: Antegrade flow in the posterior circulation appears stable from last year. Dominant left vertebral artery. Both PICA origins remain patent. No convincing distal vertebral or basilar stenosis. SCA and PCA origins remain normal. Left posterior communicating artery is present. Bilateral PCA branches are within normal limits. Anatomic variants: Dominant left vertebral artery. Other: Numerous susceptibility foci in the brainstem and deep gray nuclei in keeping with the known chronic parenchymal microhemorrhages. Brain MRI today reported separately. IMPRESSION: Intracranial MRA remains negative, stable compared to June of last year. Electronically Signed   By: Odessa Fleming M.D.   On: 10/28/2022 08:07   MR BRAIN WO CONTRAST  Result Date: 10/28/2022 CLINICAL DATA:  52 year old female code stroke presentation yesterday. History of small vessel disease. EXAM: MRI HEAD WITHOUT CONTRAST TECHNIQUE: Multiplanar, multiecho pulse sequences of the brain and surrounding structures were obtained without intravenous contrast. COMPARISON:  Head CT yesterday.  Brain MRI 08/22/2022. FINDINGS: Brain: Small new areas of abnormal diffusion in the posterior left corona radiata (series 5, image 83), tracking toward the posterior left lentiform. And also along the nearby lateral margin of the left splenium corpus callosum (series 5, image 84). These areas are not conspicuous on ADC and could be subacute. Additionally, there is a small round focus of restricted diffusion in the right temporal lobe periventricular white matter anteriorly series 5, image 69. This is about 5 mm. Faint T2 and FLAIR hyperintensity here. No other diffusion restriction.  Chronic severe signal abnormality in the brainstem and cerebellar peduncles which may in part be Wallerian degeneration. Multifocal chronic bilateral deep gray nuclei lacunar infarcts. Extensive  cerebral white matter T2 and FLAIR hyperintensity including some areas of corpus callosum encephalomalacia and lesions oriented perpendicular to the lateral ventricles. But there is no cystic encephalomalacia of the corpus callosum. Outside of the diffusion findings described above signal appears stable throughout the brain. Moderate number of chronic microhemorrhages are scattered in the bilateral deep gray nuclei, and especially throughout the brainstem. Occasional hemispheric chronic microhemorrhages also. No midline shift, mass effect, evidence of mass lesion, ventriculomegaly, extra-axial collection or acute intracranial hemorrhage. Cervicomedullary junction and pituitary are within normal limits. Vascular: Major intracranial vascular flow voids are stable. MRA today is reported separately. Skull and upper cervical spine: Negative visible cervical spine. Visualized bone marrow signal is within normal limits. Sinuses/Orbits: Stable, negative. Other: Stable mild mastoid effusions. Benign right forehead scalp lipoma. IMPRESSION: 1. Positive for punctate acute right temporal lobe white matter lacunar infarct. And two small subacute appearing white matter infarcts in the Left corona radiata, near the left splenium. No associated hemorrhage or mass effect. 2. Underlying chronic severe small vessel disease, including numerous chronic microhemorrhages in the brainstem with Wallerian degeneration. Conspicuous periventricular white matter involvement, although chronic demyelinating disease is felt less likely. 3. MRA today is reported separately. Electronically Signed   By: Odessa Fleming M.D.   On: 10/28/2022 08:00   CT HEAD CODE STROKE WO CONTRAST  Result Date: 10/27/2022 CLINICAL DATA:  Code stroke. Initial evaluation for neuro deficit, stroke. EXAM: CT HEAD WITHOUT CONTRAST TECHNIQUE: Contiguous axial images were obtained from the base of the skull through the vertex without intravenous contrast. RADIATION DOSE  REDUCTION: This exam was performed according to the departmental dose-optimization program which includes automated exposure control, adjustment of the mA and/or kV according to patient size and/or use of iterative reconstruction technique. COMPARISON:  Prior study from 08/22/2022. FINDINGS: Brain: Mild cerebral and cerebellar atrophy for age. Moderate chronic microvascular ischemic disease with multiple remote lacunar infarcts about the deep gray nuclei. No acute intracranial hemorrhage. No acute large vessel territory infarct. No mass lesion or midline shift. No hydrocephalus or extra-axial fluid collection. Vascular: No abnormal hyperdense vessel. Skull: Scalp soft tissues and calvarium within normal limits. Sinuses/Orbits: Globes and orbital soft tissues demonstrate no acute finding. Paranasal sinuses mastoid air cells are clear. Other: None. ASPECTS [facility] Stroke Program Early CT Score) - Ganglionic level infarction (caudate, lentiform nuclei, internal capsule, insula, M1-M3 cortex): 7 - Supraganglionic infarction (M4-M6 cortex): 3 Total score (0-10 with 10 being normal): 10 IMPRESSION: 1. No acute intracranial abnormality. 2. ASPECTS is 10. 3. Mildly advanced cerebral and cerebellar atrophy with moderate chronic microvascular ischemic disease. Results were called by telephone at the time of interpretation on 10/27/2022 at 10:40 pm to provider Vonita Moss , who verbally acknowledged these results. Electronically Signed   By: Rise Mu M.D.   On: 10/27/2022 22:42    Microbiology: Results for orders placed or performed during the hospital encounter of 06/07/22  Resp panel by RT-PCR (RSV, Flu A&B, Covid) Anterior Nasal Swab     Status: None   Collection Time: 06/07/22 11:48 AM   Specimen: Anterior Nasal Swab  Result Value Ref Range Status   SARS Coronavirus 2 by RT PCR NEGATIVE NEGATIVE Final    Comment: (NOTE) SARS-CoV-2 target nucleic acids are NOT DETECTED.  The SARS-CoV-2 RNA is  generally detectable in upper respiratory specimens during the  acute phase of infection. The lowest concentration of SARS-CoV-2 viral copies this assay can detect is 138 copies/mL. A negative result does not preclude SARS-Cov-2 infection and should not be used as the sole basis for treatment or other patient management decisions. A negative result may occur with  improper specimen collection/handling, submission of specimen other than nasopharyngeal swab, presence of viral mutation(s) within the areas targeted by this assay, and inadequate number of viral copies(<138 copies/mL). A negative result must be combined with clinical observations, patient history, and epidemiological information. The expected result is Negative.  Fact Sheet for Patients:  BloggerCourse.com  Fact Sheet for Healthcare Providers:  SeriousBroker.it  This test is no t yet approved or cleared by the [place] FDA and  has been authorized for detection and/or diagnosis of SARS-CoV-2 by FDA under an Emergency Use Authorization (EUA). This EUA will remain  in effect (meaning this test can be used) for the duration of the COVID-19 declaration under Section 564(b)(1) of the Act, 21 U.S.C.section 360bbb-3(b)(1), unless the authorization is terminated  or revoked sooner.       Influenza A by PCR NEGATIVE NEGATIVE Final   Influenza B by PCR NEGATIVE NEGATIVE Final    Comment: (NOTE) The Xpert Xpress SARS-CoV-2/FLU/RSV plus assay is intended as an aid in the diagnosis of influenza from Nasopharyngeal swab specimens and should not be used as a sole basis for treatment. Nasal washings and aspirates are unacceptable for Xpert Xpress SARS-CoV-2/FLU/RSV testing.  Fact Sheet for Patients: BloggerCourse.com  Fact Sheet for Healthcare Providers: SeriousBroker.it  This test is not yet approved or cleared by the [place] FDA and has been authorized for detection and/or diagnosis of SARS-CoV-2 by FDA under an Emergency Use Authorization (EUA). This EUA will remain in effect (meaning this test can be used) for the duration of the COVID-19 declaration under Section 564(b)(1) of the Act, 21 U.S.C. section 360bbb-3(b)(1), unless the authorization is terminated or revoked.     Resp Syncytial Virus by PCR NEGATIVE NEGATIVE Final    Comment: (NOTE) Fact Sheet for Patients: BloggerCourse.com  Fact Sheet for Healthcare Providers: SeriousBroker.it  This test is not yet approved or cleared by the [place] FDA and has been authorized for detection and/or diagnosis of SARS-CoV-2 by FDA under an Emergency Use Authorization (EUA). This EUA will remain in effect (meaning this test can be used) for the duration of the COVID-19 declaration under Section 564(b)(1) of the Act, 21 U.S.C. section 360bbb-3(b)(1), unless the authorization is terminated or revoked.  Performed at [facility], [address] [place], [place] [address]   Group A Strep by PCR     Status: None   Collection Time: 06/07/22 11:48 AM   Specimen: Anterior Nasal Swab; Sterile Swab  Result Value Ref Range Status   Group A Strep by PCR NOT DETECTED NOT DETECTED Final    Comment: Performed at [facility], [address], [place], [place] [address]    Labs: CBC: Recent Labs  Lab 10/26/22 0401 10/27/22 2158 10/30/22 0328 10/31/22 1014  WBC 11.8* 7.1 6.1 6.2  NEUTROABS 10.1* 4.1 2.9 3.8  HGB 9.8* 10.2* 7.9* 8.6*  HCT 30.5* 30.6* 24.5* 26.6*  MCV 90.5 88.2 90.1 88.7  PLT 234 226 174 191   Basic Metabolic Panel: Recent Labs  Lab 10/26/22 0401 10/27/22 2158 10/30/22 0328 10/31/22 1014  NA 141 142 139 139  K 3.5 3.4* 3.9 4.0  CL 114* 111 112* 113*  CO2 17* 21* 19* 18*  GLUCOSE 169* 137* 143* 170*  BUN 38* 34* 42* 53*  CREATININE  2.64* 2.86* 3.22* 2.89*  CALCIUM 9.4 9.4 8.2* 8.8*   Liver Function Tests: Recent Labs  Lab 10/26/22 0401 10/27/22 2158  AST 19 16  ALT 21 15  ALKPHOS 47 45  BILITOT 0.6 0.3  PROT 8.1 7.4  ALBUMIN 4.2 4.3   CBG: Recent Labs  Lab 10/30/22 0613 10/30/22 1223 10/30/22 1655 10/30/22 2105 10/31/22 0614  GLUCAP 117* 126* 107* 113* 123*    Discharge time spent: {LESS THAN/GREATER ZOXW:96045} 30 minutes.  Signed: Kathlen Mody, MD [facility] 10/31/2022

## 2022-11-01 NOTE — Progress Notes (Signed)
The labs were on an order set that the stroke team utilizes and patient fit that approach. Istat hcg for imaging needed by radiology and glucose for possible cause of symptoms.

## 2022-11-03 ENCOUNTER — Telehealth: Payer: Self-pay

## 2022-11-03 NOTE — Patient Outreach (Signed)
Received a red flag Emmi stroke notification for Christina Orozco . I have assigned Antionette Fairy, RN to call for follow up and determine if there are any Case Management needs.    Iverson Alamin, Don[facility] Care Management Assistant [facility] Care Management [phone]

## 2022-11-03 NOTE — Patient Outreach (Signed)
  Emmi Stroke Care Coordination Follow Up  11/03/2022 Name:  Christina Orozco MRN:  [id number] DOB:  27-Oct-1970  Subjective: Christina Orozco is a 52 y.o. year old female who is a primary care patient of Kerin Salen, PA-C  An Emmi alert was received indicating patient responded to questions: Know how/when to take meds? Problems setting up rehab?. I reached out by phone to follow up on the alert and spoke to Patient.Patient states she continues to have ongoing issues with "blurry vision to eye." She went yesterday to eye MD and was told she had cataracts and needs surgery. However, she was told that it would be several months before she could have surgery due to recent stroke. She is going today to see retina specialist. Patient states PCP appt is on it was the soonest MD could see her. She voices that she will be following up with her neuro at [facility] instead of [facility].  Reviewed and addressed red alerts. Patient states that she is taking ASA 2x/day-gets meds filled through bubble pack from pharmacy. However, she has called neuro MD to ask for script for ASA 2xday and awaiting a response. RNCM reached out to MD (Dr. Terrial Rhodes office at [phone]-left message regarding ASA script needed to be sent to pharmacy) Patient states therapy came out to see her. She is unsure if she will do therapy right now as she wants to focus on her vision first. She states that her daughter is assisting her and taking her to appts.  Patient reports she suffers from chronic constipation and normally only has a BM maybe once a week. She reports no BM in several days. States she has Miralax in the home and will take some along with some prune juice. Patient provided with RN CM contact info and advised to call for any further issues or concerns.    Care Coordination Interventions:  Yes, provided    TOC Interventions Today    Flowsheet Row Most Recent Value  TOC Interventions   TOC Interventions  Discussed/Reviewed TOC Interventions Discussed, Post discharge activity limitations per provider      Interventions Today    Flowsheet Row Most Recent Value  Chronic Disease   Chronic disease during today's visit Other  [stroke]  General Interventions   General Interventions Discussed/Reviewed General Interventions Discussed, Doctor Visits  Doctor Visits Discussed/Reviewed Doctor Visits Discussed, Specialist, PCP  PCP/Specialist Visits Compliance with follow-up visit  Education Interventions   Education Provided Provided Education  Provided Verbal Education On Nutrition, When to see the doctor, Medication  Nutrition Interventions   Nutrition Discussed/Reviewed Nutrition Discussed, Adding fruits and vegetables, Decreasing salt  Pharmacy Interventions   Pharmacy Dicussed/Reviewed Pharmacy Topics Discussed, Medications and their functions  Safety Interventions   Safety Discussed/Reviewed Safety Discussed, Fall Risk, Home Safety  Home Safety Assistive Devices        Follow up plan: Advised patient that they would continue to get automated EMMI-Stroke post discharge calls to assess how they are doing following recent hospitalization and will receive a call from a nurse if any of their responses were abnormal. Patient voiced understanding and was appreciative of f/u call.   Encounter Outcome:  Pt. Visit Completed    Alessandra Grout [facility] Care Management Care Management Community Coordinator Direct Phone: [phone] Toll Free: [phone] Fax: [phone]

## 2022-11-09 NOTE — Progress Notes (Signed)
Neuropsychological Consultation   Patient:   Christina Orozco   DOB:   [birth year]  MR Number:  [id number]  Location:  [facility] MEDICINE [facility] PHYSICAL MEDICINE & REHABILITATION [address] [place], [address] [id number] [facility] [place] [address] Dept: [phone]           Date of Service:   10/19/2022  Location of Service and Individuals present: Today's visit was conducted in my outpatient clinic office with the patient and myself present.  Start Time:   3 PM End Time:   4 PM  Today's visit was 2 hours in total duration.  1 hour and 15 minutes was spent with clinical interview and the other 45 minutes was spent with record review and report writing.  Patient Consent and Confidentiality: Patient consented for clinical visit and discussed limits of confidentiality particularly with electronic medical records and information being supplied back to her referring physician Claudette Laws, MD and being made available in the patient's electronic medical records.  Consent for Evaluation and Treatment:  Signed:  Yes Explanation of Privacy Policies:  Signed:  Yes Discussion of Confidentiality Limits:  Yes  Provider/Observer:  Arley Phenix, Psy.D.       Clinical Neuropsychologist       Billing Code/Service: [id number]  Chief Complaint:    No chief complaint on file.   Reason for Service:    Christina Orozco is a 52 year old female referred for neuropsychological/psychological therapeutic interventions with a history of multiple previous cerebrovascular accidents.  The patient was recently seen in the inpatient comprehensive rehabilitation program and I saw the patient while she was admitted on 08/30/2022.  Patient has a past medical history that includes significant hypertension, hyperlipidemia, controlled type 2 diabetes and prior history of multiple strokes/TIA.  Patient also has a history of depression and has taken Cymbalta in the past with no  current psychotropic medications.  Patient has had previous diagnoses including obstructive sleep apnea and was on CPAP prior.  Patient had a more recent sleep study done that was required to get a new CPAP machine and at that point the sleep study was not definitive.  The sleep apnea machine was not reviewed/renewed although I think in this particular case we should push to restarting that.  The most recent CVA occurred on 08/22/2022 resulting in left-sided weakness, dizziness and aphasia.  MRI brain at the time identified punctuate foci of acute/early subacute ischemia in the lateral margins of the left basal ganglia.  During the clinical interview today, the patient reports of her 10 or more cerebrovascular events that she has suffered.  The patient reports that with her most recent stroke and continuing after her discharge from CIR the patient would cry at the drop of a hat and that her crying and emotional shifts were much stronger than it had ever been in her life.  Patient reports that some of this is improved but she continues to have much more reactive emotional response.  The patient reports that her negative emotional responses are very strong and different than her usual past emotional responses.  Patient reports that when she first got out of the hospital that she was scared to go to sleep for fear of having another stroke event with this fear has improved somewhat.  The patient was afraid that she would not wake up in the morning and die in her sleep.  The patient has started using a CPAP machine again.  Patient also has a history of  seizure in 2019.  Patient, with a lot of effort, has improved her metabolic status related to her diabetes and her A1c has reduced significantly and is now in an appropriate range.  Patient reports that her first stroke was in 2016 after she stopped using her CPAP machine.  Patient reports that she still has some vision loss and has stopped driving and continues to  experience motor deficits and speech fluency changes.  Patient has had multiple hospitalizations and rehabilitative efforts and continues with left-sided weakness.   Note: Post our clinical interview on 10/19/2022 the patient had another cerebrovascular accident on 10/27/2022.  During that visit she admitted to missing some of her medications occasionally and she was admitted for acute CVA.  On this occasion she had a right temporal lobe white matter lacunar infarct in the left corona radiata.  Medical History:   Past Medical History:  Diagnosis Date   Anemia    severe   Anxiety    Asthma    CKD (chronic kidney disease)    stage IV   DDD (degenerative disc disease), cervical    Depression    Diabetes mellitus (HCC)    Dislocated shoulder    right   GERD (gastroesophageal reflux disease)    Headache    migraines (about once a month)   History of kidney stones    Hypertension    Neuropathy    Peripheral vascular disease (HCC)    Pneumonia    Stroke [facility])          Patient Active Problem List   Diagnosis Date Noted   Diplopia 10/31/2022   OSA (obstructive sleep apnea) 08/25/2022   Subcortical infarction (HCC) 08/23/2022   Overweight (BMI 25.0-29.9) 08/23/2022   Asthma, chronic 08/22/2022   TIA (transient ischemic attack) 06/20/2022   Numbness and tingling in left hand 03/02/2022   Gait disturbance, post-stroke 03/02/2022   AKI (acute kidney injury) (HCC) 12/23/2021   Acute embolic stroke (HCC) 12/23/2021   Hypernatremia 12/20/2021   Chronic kidney disease (CKD), stage IV (severe) (HCC) 12/19/2021   Left-sided weakness and visual deficits  12/19/2021   Vision abnormalities 12/19/2021   Acute stroke due to ischemia (HCC) 11/10/2021   Depression 07/05/2020   CVA (cerebral vascular accident) (HCC) 06/21/2020   CVA (cerebrovascular accident) (HCC) 06/19/2020   Generalized anxiety disorder 03/01/2019   Abnormality of gait 10/14/2018   Dizziness and giddiness 09/13/2018    Debility 06/26/2018   Chronic nonintractable headache 04/26/2018   Diastolic dysfunction    Congestion of nasal sinus    Pneumonia of left lower lobe due to infectious organism    SOB (shortness of breath)    Candidiasis    Anemia    Hypoalbuminemia due to protein-calorie malnutrition (HCC)    Hypertension    Type 2 diabetes mellitus with chronic kidney disease, without long-term current use of insulin (HCC)    PRES (posterior reversible encephalopathy syndrome)    Hypokalemia    History of CVA with residual deficit    History of seizure 03/25/2018   Bilateral carpal tunnel syndrome 02/14/2018   Arthritis 10/31/2017   Asthma 10/31/2017   Constipation 10/31/2017   Kidney stone 10/31/2017   Anemia, chronic disease 08/09/2017   Hyperlipidemia 07/26/2017   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HCC) 07/25/2017   Anxiety as acute reaction to exceptional stress 04/23/2017   Insomnia 10/08/2016   Neuropathy 06/07/2016   Gait instability    Lesion of pons    Slurred speech    Vertigo 04/30/2016  Hyperglycemia 04/30/2016   Chronic kidney disease (CKD), stage III (moderate) (HCC) 04/30/2016   Ataxia 04/30/2016   Chronic right shoulder pain 04/03/2016   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  Most recent MRI performed on 10/28/2022 indicated chronic severe signal abnormality in the brainstem and cerebellar peduncles, multifocal chronic bilateral deep gray nuclei lacunar infarcts.  An extensive cerebral white matter T2 and FLAIR hyperintensities including some areas of corpus callosum.  Encephalomalacia no and lesions oriented perpendicular to lateral ventricles are also noted.  A moderate number of  chronic microhemorrhages are scattered in the bilateral deep gray nuclei especially throughout the brainstem.  Behavioral Observation/Mental Status:   Christina Orozco  presents as a 52 y.o.-year-old Right handed African American Female who appeared her stated age. her dress was Appropriate and she was Well Groomed and her manners were Appropriate to the situation.  her participation was indicative of Appropriate behaviors.  There were physical disabilities noted.  she displayed an appropriate level of cooperation and motivation.    Interactions:    Active Appropriate  Attention:   abnormal and attention span appeared shorter than expected for age  Memory:   within normal limits; recent and remote memory intact  Visuo-spatial:   not examined  Speech (Volume):  normal  Speech:   normal; slurred  Thought Process:  Coherent and Relevant  Coherent and Logical  Though Content:  WNL; not suicidal and not homicidal  Orientation:   person, place, time/date, and situation  Judgment:   Fair  Planning:   Fair  Affect:    Anxious  Mood:    Anxious  Insight:   Good  Intelligence:   normal  Marital Status/Living:  The patient was born and raised in [place] along with 1 sister and 2 brothers.  The patient continues to live with her youngest daughter.  Patient is single and is never married.  The patient has 3 adult children age 31, 41.  Educational and Occupational History:     Highest Level of Education:   Patient graduated from high school and has taken some college courses at [employer].  Her best subject was math and had some relative weaknesses with regard to reading.  She did repeat the second grade.  Current Occupation:    The patient is disabled due to her cerebrovascular events.  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  Psychiatric History:  The patient has a history of anxiety and acute reaction to  significant psychosocial stressors in the past and history of some mild depression but her emotional status has become quite variable more recently with patterns consistent with pseudobulbar affect type symptoms.  History of Substance Use or Abuse:  No concerns of substance abuse are reported.    Family Med/Psych History:  Family History  Problem Relation Age of Onset   Vascular Disease Mother    CAD Mother    Heart failure Mother    Heart disease Other    Cancer Other        colon, stomach, pancreatic, lung   Diabetes Other    Breast cancer Sister 30   Seizures Maternal Uncle     Impression/DX:  Christina Orozco is a 52 year old female referred for neuropsychological/psychological therapeutic interventions with a history of multiple previous cerebrovascular accidents.  The patient was recently seen in the inpatient comprehensive rehabilitation program and I saw the patient while she was admitted on 08/30/2022.  Patient has a past medical history that includes significant hypertension, hyperlipidemia, controlled type 2 diabetes and prior history of multiple strokes/TIA.  Patient also has a history of depression and has taken Cymbalta in the past with no current psychotropic medications.  Patient has had previous diagnoses including obstructive sleep apnea and was on CPAP prior.  Patient had a more recent sleep study done that was required to get a new CPAP machine and at that point the sleep study was not definitive.  The sleep apnea machine was not reviewed/renewed although I think in this particular case we should push to restarting that.  The most recent CVA occurred on 08/22/2022 resulting in left-sided weakness, dizziness and aphasia.  MRI brain at the time identified punctuate foci of acute/early subacute ischemia in the lateral margins of the left basal ganglia.  During the clinical interview today, the patient reports of her 10 or more cerebrovascular events that she has suffered.  The  patient reports that with her most recent stroke and continuing after her discharge from CIR the patient would cry at the drop of a hat and that her crying and emotional shifts were much stronger than it had ever been in her life.  Patient reports that some of this is improved but she continues to have much more reactive emotional response.  The patient reports that her negative emotional responses are very strong and different than her usual past emotional responses.  Patient reports that when she first got out of the hospital that she was scared to go to sleep for fear of having another stroke event with this fear has improved somewhat.  The patient was afraid that she would not wake up in the morning and die in her sleep.  The patient has started using a CPAP machine again.  Patient also has a history of seizure in 2019.  Patient, with a lot of effort, has improved her metabolic status related to her diabetes and her A1c has reduced significantly and is now in an appropriate range.  Patient reports that her first stroke was in 2016 after she stopped using her CPAP machine.  Patient reports that she still has some vision loss and has stopped driving and continues to experience motor deficits and speech fluency changes.  Patient has had multiple hospitalizations and rehabilitative efforts and continues with left-sided weakness.  Disposition/Plan:  We have set the patient up for therapeutic interventions to try to work on coping and adjustment with multiple prior cerebrovascular events and symptoms consistent with pseudobulbar affect.  I will coordinate with her treating physiatrist around strategies for addressing these mood disturbance type symptoms.  Diagnosis:    PBA (pseudobulbar affect)  Numbness and tingling in left hand  Gait disturbance, post-stroke  Left-sided weakness and visual deficits   Subcortical infarction [facility])        Note: This document was prepared using Dragon voice recognition  software and may include unintentional dictation errors.   Electronically Signed   _______________________ Arley Phenix, Psy.D. Clinical Neuropsychologist

## 2023-01-03 ENCOUNTER — Encounter (HOSPITAL_COMMUNITY): Payer: Self-pay

## 2023-01-04 ENCOUNTER — Encounter (HOSPITAL_COMMUNITY): Payer: Self-pay

## 2023-01-04 ENCOUNTER — Encounter (HOSPITAL_BASED_OUTPATIENT_CLINIC_OR_DEPARTMENT_OTHER): Payer: Self-pay | Admitting: Family

## 2023-01-04 ENCOUNTER — Ambulatory Visit (INDEPENDENT_AMBULATORY_CARE_PROVIDER_SITE_OTHER): Payer: 59 | Admitting: Family

## 2023-01-04 VITALS — BP 129/83 | HR 77 | Ht 62.0 in | Wt 137.8 lb

## 2023-01-04 DIAGNOSIS — I10 Essential (primary) hypertension: Secondary | ICD-10-CM

## 2023-01-04 DIAGNOSIS — G4733 Obstructive sleep apnea (adult) (pediatric): Secondary | ICD-10-CM

## 2023-01-04 DIAGNOSIS — E782 Mixed hyperlipidemia: Secondary | ICD-10-CM

## 2023-01-04 DIAGNOSIS — I639 Cerebral infarction, unspecified: Secondary | ICD-10-CM

## 2023-01-04 NOTE — Patient Instructions (Addendum)
Medication Instructions:  Continue your current medications.   *If you need a refill on your cardiac medications before your next appointment, please call your pharmacy*  Follow-Up: Your next appointment:   Follow up as scheduled   Other Instructions  [facility] Stroke Support Group: Contact: Ms. Casimiro Needle, DPT [phone] [email or web address] Meeting on the 2nd Thursday of each month 4pm-5pm at [facility])  We will reach out to your insurance company to see if we can get you approved for Repatha. This is a cholesterol medication that helps to reduce your risk of a future stroke.  Evolocumab Injection What is this medication? EVOLOCUMAB (e voe LOK ue mab) treats high cholesterol. It may also be used to lower the risk of heart attack, stroke, and a type of heart surgery. It works by decreasing bad cholesterol (such as LDL) in your blood. It is a monoclonal antibody. Changes to diet and exercise are often combined with this medication. This medicine may be used for other purposes; ask your health care provider or pharmacist if you have questions. COMMON BRAND NAME(S): Repatha, Repatha SureClick What should I tell my care team before I take this medication? They need to know if you have any of these conditions: An unusual or allergic reaction to evolocumab, latex, other medications, foods, dyes, or preservatives Pregnant or trying to get pregnant Breast-feeding How should I use this medication? This medication is injected under the skin. You will be taught how to prepare and give it. Take it as directed on the prescription label at the same time every day. Keep taking it unless your care team tells you to stop. It is important that you put your used needles and syringes in a special sharps container. Do not put them in a trash can. If you do not have a sharps container, call your pharmacist or care team to get one. This  medication comes with INSTRUCTIONS FOR USE. Ask your pharmacist for directions on how to use this medication. Read the information carefully. Talk to your pharmacist or care team if you have questions. Talk to your care team about the use of this medication in children. While it may be prescribed for children as young as 10 years for selected conditions, precautions do apply. Overdosage: If you think you have taken too much of this medicine contact a poison control center or emergency room at once. NOTE: This medicine is only for you. Do not share this medicine with others. What if I miss a dose? It is important not to miss any doses. Talk to your care team about what to do if you miss a dose. What may interact with this medication? Interactions are not expected. This list may not describe all possible interactions. Give your health care provider a list of all the medicines, herbs, non-prescription drugs, or dietary supplements you use. Also tell them if you smoke, drink alcohol, or use illegal drugs. Some items may interact with your medicine. What should I watch for while using this medication? Visit your care team for regular checks on your progress. Tell your care team if your symptoms do not start to get better or if they get worse. You may need blood work while you are taking this medication. Do not wear the on-body infuser during an MRI. Taking this medication is only part of a total heart healthy program. Ask your care team if there are other changes you can make to improve your overall health. What  side effects may I notice from receiving this medication? Side effects that you should report to your care team as soon as possible: Allergic reactions or angioedema--skin rash, itching or hives, swelling of the face, eyes, lips, tongue, arms, or legs, trouble swallowing or breathing Side effects that usually do not require medical attention (report to your care team if they continue or are  bothersome): Back pain Flu-like symptoms--fever, chills, muscle pain, cough, headache, fatigue Pain, redness, or irritation at injection site Runny or stuffy nose Sore throat This list may not describe all possible side effects. Call your doctor for medical advice about side effects. You may report side effects to FDA at 1-800-FDA-1088. Where should I keep my medication? Keep out of the reach of children and pets. Store in a refrigerator or at room temperature between 20 and 25 degrees C (68 and 77 degrees F). Refrigeration (preferred): Store it in the refrigerator. Do not freeze. Keep it in the original carton until you are ready to take it. Remove the dose from the carton about 30 minutes before it is time for you to take it. Get rid of any unused medication after the expiration date. Room temperature: This medication may be stored at room temperature for up to 30 days. Keep it in the original carton until you are ready to take it. If it is stored at room temperature, get rid of any unused medication after 30 days or after it expires, whichever is first. Protect from light. Do not shake. Avoid exposure to extreme heat. To get rid of medications that are no longer needed or have expired: Take the medication to a medication take-back program. Check with your pharmacy or law enforcement to find a location. If you cannot return the medication, ask your pharmacist or care team how to get rid of this medication safely. NOTE: This sheet is a summary. It may not cover all possible information. If you have questions about this medicine, talk to your doctor, pharmacist, or health care provider.  2024 Elsevier/Gold Standard (2021-07-08 00:00:00)

## 2023-01-04 NOTE — Progress Notes (Signed)
Cardiology Office Note:  .   Date:  01/04/2023  ID:  [name], DOB [birth year], MRN [id number] PCP: Christina Salen, PA-C  [facility] Providers Cardiologist:  Christina Si, MD    History of Present Illness: .   Christina Orozco is a 52 y.o. female with a hx of hypertension, OSA,  severely, diabetes, recurrent stroke (ILR placed 2019 removed 07/2019 after no atrial arrhythmias noted), seizure, CKD IV, asthma, vertigo last seen 09/2022.    Previous echocardiogram November 2017 LVEF 65 to 70% and mild basal septal hypertrophy.  Repeat echo June 2018 LVEF 65 to 70% with severe LVH.  She was initially diagnosed with hypertension in high school per her report. Multiple family member with hypertension in her mother had a heart attack at age 77 and several other family members with premature CAD.   She was admitted February 2019 with acute stroke in the left paramedian pons.  She presented with dizziness and admission blood pressure was 148/85.  Carotid Dopplers with bilateral 1% 9% stenosis and echo was unchanged from June 2018.  TEE performed 08/2017 no PFO.  Loop recorder placed at that time.  Loop recorder was removed 07/2019. Admitted September 2020 after recurrent stroke.   She was admitted 06/19/2020 after presenting with acute onset right arm and leg weakness with aphasia and systolic blood pressure in the 240s.  Work-up revealed subacute infarct in the left lateral aspect of the pons -small vessel disease.   She was hospitalized 01/05/2021 - 01/07/2021 at [facility] with diagnosis of strokelike symptoms.  She had extensive stroke work-up including normal intracranial MRI and no hemodynamically significant stenosis on carotid ultrasound January 2022.  MRI brain revealed new late to subacute infarct in the frontal lobe and possible pontine infarct as well.  She had been off of her Plavix for 2 months as she had run out.    Presented to ED 5/0/23 with expressive  aphasia.  Workup with no acute infarct nor intracranial abnormality.  Seen 11/11/2021 for left facial numbness.  Admitted for acute stroke.  MRI with diffusion restriction in right cerebral peduncle with findings of previous stroke.  2D echo LVEF 70 to 75%, no RWMA.  ED visit since 9/23 left-sided weakness with MRI revealing 1.9 cm acute ischemic nonhemorrhagic infarct involving deep white matter of the posterior right frontal sacrum semiovale.  Found of poorly controlled hypertension.     Seen 06/12/2018 for AMS acute onset while driving.  In ED CT head nonacute.  Deemed possible TIA and EEG completed with no signs of seizure.   ED visit 08/2022 as code stroke after experiencing vision changes in left eye and weakness left leg.  MR brain with tiny punctate left subcortical lacunar infarct.  Recommended for Brilinta X 4 weeks.  Discharged to rehab facility and eventually discharged home 09/05/2022.    At last visit 10/05/2022 carvedilol increased to 25 mg twice daily for BP control.  She was also provided compression stockings Rx.  Admitted 5/17 - 10/31/2022 with CVA (punctate acute right temporal lobe white matter lacunar infarct, 2 small subacute appearing white matter infarct in left corona radiata).  Aspirin, Brilinta, Lipitor continued.  BP was at goal and amlodipine 10 mg daily, carvedilol 12 5 mg twice daily.  Gentle hydration started for AKI with creatinine as high as 3.2. She saw her neurologist who recommended ASA 81mg  BID and Brilinta 90mg  BID. She was referred to stroke specialist at [facility] due to refractory strokes.  Presents today for follow up independently. She notes she still cannot see well after her most recent stroke. Her walking is not back to baseline. Unfortunately has lost some support form her daughters but her brother is helping her by dropping off meals, taking her shopping. She has transportation to appointment. Her PCP is referring for [facility]. BP has been controlled at home. She is  seeing  a specialist at [facility] regarding her recurrent strokes despite BP, lipid, A1c control.   ROS: Please see the history of present illness.    All other systems reviewed and are negative.   Studies Reviewed: .        Cardiac Studies & Procedures     STRESS TESTS  MYOCARDIAL PERFUSION IMAGING 09/28/2017  Narrative  Normal perfusion  Nuclear stress EF: 61%.  The study is normal.  This is a low risk study.   ECHOCARDIOGRAM  ECHOCARDIOGRAM COMPLETE 08/23/2022  Narrative ECHOCARDIOGRAM REPORT    Patient Name:   Christina Orozco Date of Exam: 08/23/2022 Medical Rec #:  [id number]        Height:       62.0 in Accession #:    [id number]       Weight:       142.0 lb Date of Birth:  06/01/1971       BSA:          1.653 m Patient Age:    51 years         BP:           166/75 mmHg Patient Gender: F                HR:           87 bpm. Exam Location:  Inpatient  Procedure: 2D Echo  Indications:    TIA  History:        Patient has prior history of Echocardiogram examinations, most recent 06/21/2022. Stroke; Risk Factors:Diabetes, Hypertension and Dyslipidemia.  Sonographer:    Cathie Hoops Referring Phys: CHING T TU  IMPRESSIONS   1. Left ventricular ejection fraction, by estimation, is 65 to 70%. Left ventricular ejection fraction by 3D volume is 68 %. The left ventricle has normal function. The left ventricle has no regional wall motion abnormalities. There is moderate asymmetric left ventricular hypertrophy of the basal-septal segment (14 mm). Hypertrophied papillary muscles. Indeterminate diastolic filling due to E-A fusion. 2. Right ventricular systolic function is normal. The right ventricular size is normal. 3. A small pericardial effusion is present. The pericardial effusion is anterior to the right ventricle. 4. The mitral valve is normal in structure. Trivial mitral valve regurgitation. No evidence of mitral stenosis. 5. The aortic valve is tricuspid.  Aortic valve regurgitation is not visualized. No aortic stenosis is present.  Conclusion(s)/Recommendation(s): No intracardiac source of embolism detected on this transthoracic study. Consider a transesophageal echocardiogram to exclude cardiac source of embolism if clinically indicated.  FINDINGS Left Ventricle: Left ventricular ejection fraction, by estimation, is 65 to 70%. Left ventricular ejection fraction by 3D volume is 68 %. The left ventricle has normal function. The left ventricle has no regional wall motion abnormalities. The left ventricular internal cavity size was normal in size. There is moderate asymmetric left ventricular hypertrophy of the basal-septal segment. Indeterminate diastolic filling due to E-A fusion.  Right Ventricle: The right ventricular size is normal. No increase in right ventricular wall thickness. Right ventricular systolic function is normal.  Left Atrium: Left atrial size was normal in  size.  Right Atrium: Right atrial size was normal in size.  Pericardium: A small pericardial effusion is present. The pericardial effusion is anterior to the right ventricle.  Mitral Valve: Chordal SAM. The mitral valve is normal in structure. Trivial mitral valve regurgitation. No evidence of mitral valve stenosis.  Tricuspid Valve: The tricuspid valve is normal in structure. Tricuspid valve regurgitation is trivial. No evidence of tricuspid stenosis.  Aortic Valve: The aortic valve is tricuspid. Aortic valve regurgitation is not visualized. No aortic stenosis is present. Aortic valve mean gradient measures 3.0 mmHg. Aortic valve peak gradient measures 5.3 mmHg. Aortic valve area, by VTI measures 2.26 cm.  Pulmonic Valve: The pulmonic valve was normal in structure. Pulmonic valve regurgitation is trivial. No evidence of pulmonic stenosis.  Aorta: The aortic root is normal in size and structure.  Venous: The inferior vena cava was not well visualized.  IAS/Shunts: The  interatrial septum was not well visualized.   LEFT VENTRICLE PLAX 2D LVIDd:         3.80 cm         Diastology LVIDs:         2.20 cm         LV e' medial:    5.98 cm/s LV PW:         1.10 cm         LV E/e' medial:  15.0 LV IVS:        1.00 cm         LV e' lateral:   6.42 cm/s LVOT diam:     2.00 cm         LV E/e' lateral: 14.0 LV SV:         51 LV SV Index:   31 LVOT Area:     3.14 cm        3D Volume EF LV 3D EF:    Left ventricul LV Volumes (MOD)                            ar LV vol d, MOD    76.0 ml                    ejection A2C:                                        fraction LV vol d, MOD    110.0 ml                   by 3D A4C:                                        volume is LV vol s, MOD    27.0 ml                    68 %. A2C: LV vol s, MOD    48.1 ml A4C:                           3D Volume EF: LV SV MOD A2C:   49.0 ml       3D EF:        68 % LV SV MOD A4C:  110.0 ml      LV EDV:       102 ml LV SV MOD BP:    52.8 ml       LV ESV:       32 ml LV SV:        70 ml  RIGHT VENTRICLE RV Basal diam:  2.40 cm RV Mid diam:    1.90 cm RV S prime:     16.50 cm/s TAPSE (M-mode): 2.2 cm  LEFT ATRIUM             Index        RIGHT ATRIUM           Index LA diam:        3.20 cm 1.94 cm/m   RA Area:     10.90 cm LA Vol (A2C):   21.4 ml 12.95 ml/m  RA Volume:   23.10 ml  13.98 ml/m LA Vol (A4C):   27.8 ml 16.82 ml/m LA Biplane Vol: 26.7 ml 16.16 ml/m AORTIC VALVE                    PULMONIC VALVE AV Area (Vmax):    2.36 cm     PV Vmax:       0.93 m/s AV Area (Vmean):   2.40 cm     PV Peak grad:  3.5 mmHg AV Area (VTI):     2.26 cm AV Vmax:           115.50 cm/s AV Vmean:          77.800 cm/s AV VTI:            0.224 m AV Peak Grad:      5.3 mmHg AV Mean Grad:      3.0 mmHg LVOT Vmax:         86.77 cm/s LVOT Vmean:        59.333 cm/s LVOT VTI:          0.161 m LVOT/AV VTI ratio: 0.72  AORTA Ao Root diam: 3.10 cm  MITRAL VALVE                 TRICUSPID VALVE MV Area (PHT): 6.32 cm     TR Peak grad:   19.0 mmHg MV Decel Time: 120 msec     TR Vmax:        218.00 cm/s MR Peak grad: 25.4 mmHg MR Vmax:      252.00 cm/s   SHUNTS MV E velocity: 89.70 cm/s   Systemic VTI:  0.16 m MV A velocity: 105.00 cm/s  Systemic Diam: 2.00 cm MV E/A ratio:  0.85  Weston Brass MD Electronically signed by Weston Brass MD Signature Date/Time: 08/23/2022/3:55:54 PM    Final   TEE  ECHO TEE 08/27/2017  Narrative *[place]* *[facility]* [address] [place], [place] [address] [phone]  ------------------------------------------------------------------- Transesophageal Echocardiography  Patient:    Adrine, Hayworth MR #:       [id number] Study Date: 08/27/2017 Gender:     F Age:        46 Height:     160 cm Weight:     90.5 kg BSA:        2.05 m^2 Pt. Status: Room:  ATTENDING    Carney Corners K REFERRING    Corine Shelter K ADMITTING    Thurmon Fair, MD PERFORMING   Thurmon Fair, MD  SONOGRAPHER  Celene Skeen, RDCS  cc:  ------------------------------------------------------------------- LV EF: 55% -   60%  ------------------------------------------------------------------- History:   PMH:  Stroke.  Risk factors:  Hypertension. Diabetes mellitus.  ------------------------------------------------------------------- Study Conclusions  - Left ventricle: Systolic function was normal. The estimated ejection fraction was in the range of 55% to 60%. Wall motion was normal; there were no regional wall motion abnormalities. Left ventricular diastolic function parameters were normal. - Left atrium: No evidence of thrombus in the atrial cavity or appendage. No evidence of thrombus in the atrial cavity or appendage. No spontaneous echo contrast was observed. - Right atrium: No evidence of thrombus in the atrial cavity or appendage. - Atrial septum: No defect or  patent foramen ovale was identified. Echo contrast study showed no right-to-left atrial level shunt, at baseline or with provocation.  ------------------------------------------------------------------- Study data:   Study status:  Routine.  Consent:  The risks, benefits, and alternatives to the procedure were explained to the patient and informed consent was obtained.  Procedure:  The patient reported no pain pre or post test. Initial setup. The patient was brought to the laboratory. Surface ECG leads were monitored. Sedation. Conscious sedation was administered by cardiology staff. Transesophageal echocardiography. A transesophageal probe was inserted by the attending cardiologistwithout difficulty. Image quality was adequate.  Study completion:  The patient tolerated the procedure well. There were no complications.          Diagnostic transesophageal echocardiography.  2D and color Doppler. Birthdate:  Patient birthdate: [birth year].  Age:  Patient is 52 yr old.  Sex:  Gender: female.    BMI: 35.3 kg/m^2.  Blood pressure: 174/91  Patient status:  Outpatient.  Study date:  Study date: 08/27/2017. Study time: 11:52 AM.  Location:  Endoscopy.  -------------------------------------------------------------------  ------------------------------------------------------------------- Left ventricle:  Systolic function was normal. The estimated ejection fraction was in the range of 55% to 60%. Wall motion was normal; there were no regional wall motion abnormalities. Left ventricular diastolic function parameters were normal.  ------------------------------------------------------------------- Aortic valve:   Structurally normal valve. Trileaflet; normal thickness leaflets. Cusp separation was normal.  Doppler:  There was no significant regurgitation.  ------------------------------------------------------------------- Aorta:  There was no atheroma. There was no evidence for dissection.  Aortic root: The aortic root was not dilated. Ascending aorta: The ascending aorta was normal in size. Aortic arch: The aortic arch was normal in size. Descending aorta: The descending aorta was normal in size.  ------------------------------------------------------------------- Mitral valve:   Structurally normal valve.   Leaflet separation was normal.  Doppler:  There was no significant regurgitation.  ------------------------------------------------------------------- Left atrium:  The atrium was normal in size.  No evidence of thrombus in the atrial cavity or appendage.  No evidence of thrombus in the atrial cavity or appendage. No spontaneous echo contrast was observed. The appendage was morphologically a left appendage, multilobulated, and of normal size. Emptying velocity was normal.  ------------------------------------------------------------------- Atrial septum:  No defect or patent foramen ovale was identified. Echo contrast study showed no right-to-left atrial level shunt, at baseline or with provocation.  ------------------------------------------------------------------- Right ventricle:  The cavity size was normal. Wall thickness was normal. Systolic function was normal.  ------------------------------------------------------------------- Pulmonic valve:    Structurally normal valve.  ------------------------------------------------------------------- Tricuspid valve:   Structurally normal valve.   Leaflet separation was normal.  Doppler:  There was no significant regurgitation.  ------------------------------------------------------------------- Pulmonary artery:   The main pulmonary artery was normal-sized.  ------------------------------------------------------------------- Right atrium:  The atrium was normal in size.  No evidence of  thrombus in the atrial cavity or appendage. The appendage was morphologically a right  appendage.  ------------------------------------------------------------------- Pericardium:  There was no pericardial effusion.  ------------------------------------------------------------------- Prepared and Electronically Authenticated by  Thurmon Fair, MD 2019-03-18T18:03:50            Risk Assessment/Calculations:             Physical Exam:   VS:  BP 129/83 (BP Location: Right Arm, Patient Position: Sitting, Cuff Size: Normal)   Pulse 77   Ht 5\' 2"  (1.575 m)   Wt 137 lb 12.8 oz (62.5 kg)   LMP 05/22/2018 (Within Days)   SpO2 100%   BMI 25.20 kg/m    Wt Readings from Last 3 Encounters:  01/04/23 137 lb 12.8 oz (62.5 kg)  10/28/22 139 lb 5.3 oz (63.2 kg)  10/26/22 137 lb (62.1 kg)    GEN: Well nourished, well developed in no acute distress NECK: No JVD; No carotid bruits CARDIAC: RRR, no murmurs, rubs, gallops RESPIRATORY:  Clear to auscultation without rales, wheezing or rhonchi  ABDOMEN: Soft, non-tender, non-distended EXTREMITIES:  No edema; No deformity   ASSESSMENT AND PLAN: .    Hypertensive heart disease with LVH - BP well controlled. Continue current antihypertensive regimen: Amlodipine 10mg  every day, Carvedilol 25mg  BID. Discussed to monitor BP at home at least 2 hours after medications and sitting for 5-10 minutes.    CKD IV -Follows with nephrology at [facility]. Careful titration of diuretic and antihypertensive.     OSA - CPAP compliance encouraged. Endorses using regularly.    Recurrent CVA - Continue to follow with neurology. Continue optimal BP and lipid control. Continue Atorvastatin, Brilinta, Aspirin.Had ILR placed 2019, removed 07/2019 after no evidence of atrial arrhythmias. Following with specialist about [facility] given recurrent CVA despite lipid, BP control. Recommend addition of PCSK9i for further reduction of cardiovascular risk given multiple recurrent CVA despite maximum dose statin. Will send for prior authorization.    HLD  -08/23/2022 LDL 35.  LDL at goal of <55 due to recurrent CVA, HTN. Continue Atorvastatin 80mg  QD. Recommend addition of PCSK9i for further reduction of cardiovascular risk given multiple recurrent CVA despite maximum dose statin.        Dispo: follow up in 6 months  Signed, Alver Sorrow, NP

## 2023-01-05 ENCOUNTER — Telehealth (HOSPITAL_BASED_OUTPATIENT_CLINIC_OR_DEPARTMENT_OTHER): Payer: Self-pay

## 2023-01-05 NOTE — Telephone Encounter (Signed)
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

## 2023-01-08 ENCOUNTER — Telehealth (HOSPITAL_BASED_OUTPATIENT_CLINIC_OR_DEPARTMENT_OTHER): Payer: Self-pay

## 2023-01-08 ENCOUNTER — Other Ambulatory Visit (HOSPITAL_COMMUNITY): Payer: Self-pay

## 2023-01-08 NOTE — Telephone Encounter (Signed)
PA request has been Submitted. New Encounter created for follow up. For additional info see Pharmacy Prior Auth telephone encounter from 01/08/23.

## 2023-01-08 NOTE — Telephone Encounter (Signed)
Prior auth follow up

## 2023-01-08 NOTE — Telephone Encounter (Signed)
Pharmacy Patient Advocate Encounter  Received notification from [facility] that Prior Authorization for REPATHA has been DENIED. Please advise how you'd like to proceed. Full denial letter will be uploaded to the media tab. See denial reason below.  LDL NOT HIGH ENOUGH TO APPROVE PER PLAN REQUIREMENTS.

## 2023-01-08 NOTE — Telephone Encounter (Signed)
Hi [name] - did we include documentation about recurrent CVA and list CVA as primary diagnosis? I know her LDL is controlled but she has had recurrent CVA even on max dose statin with low LDL so trying to further reduce her risk.   If we've submitted it with that info not sure what else we can do to get it approved.   Alver Sorrow, NP

## 2023-01-08 NOTE — Telephone Encounter (Signed)
Pharmacy Patient Advocate Encounter   Received notification from Pt Calls Messages that prior authorization for REPATHA is required/requested.   Insurance verification completed.   The patient is insured through [facility] .   Per test claim: PA required; PA submitted to [facility] via CoverMyMeds Key/confirmation #/EOC [id number] Status is pending

## 2023-01-09 ENCOUNTER — Encounter: Payer: 59 | Attending: Psychology | Admitting: Physical Medicine & Rehabilitation

## 2023-01-09 DIAGNOSIS — R269 Unspecified abnormalities of gait and mobility: Secondary | ICD-10-CM | POA: Insufficient documentation

## 2023-01-09 DIAGNOSIS — F482 Pseudobulbar affect: Secondary | ICD-10-CM | POA: Insufficient documentation

## 2023-01-09 DIAGNOSIS — I69398 Other sequelae of cerebral infarction: Secondary | ICD-10-CM | POA: Insufficient documentation

## 2023-01-09 DIAGNOSIS — R202 Paresthesia of skin: Secondary | ICD-10-CM | POA: Insufficient documentation

## 2023-01-09 DIAGNOSIS — I639 Cerebral infarction, unspecified: Secondary | ICD-10-CM | POA: Insufficient documentation

## 2023-01-09 DIAGNOSIS — R531 Weakness: Secondary | ICD-10-CM | POA: Insufficient documentation

## 2023-01-09 DIAGNOSIS — R200 Anesthesia of skin: Secondary | ICD-10-CM | POA: Insufficient documentation

## 2023-01-09 NOTE — Telephone Encounter (Signed)
Hi Christina Orozco. This plan did not allow for a specific ICD submission, it gave a drop down of maybe 3 options. ASCVD was selected as it was the only option the plan gave that would apply to CVA.

## 2023-01-12 NOTE — Telephone Encounter (Signed)
Returned call to the patient, no answer, left detailed message (ok per DPR) with NP recommendations and instructions to call with questions if needed.

## 2023-01-12 NOTE — Telephone Encounter (Signed)
Please let her know that unfortunately Repatha is not covered by her insurance plan so we will continue her statin.  Alver Sorrow, NP

## 2023-02-01 ENCOUNTER — Ambulatory Visit: Payer: 59 | Attending: Internal Medicine | Admitting: Physical Therapy

## 2023-02-01 VITALS — BP 129/79 | HR 69

## 2023-02-01 DIAGNOSIS — R296 Repeated falls: Secondary | ICD-10-CM | POA: Insufficient documentation

## 2023-02-01 DIAGNOSIS — R2689 Other abnormalities of gait and mobility: Secondary | ICD-10-CM | POA: Insufficient documentation

## 2023-02-01 DIAGNOSIS — R2681 Unsteadiness on feet: Secondary | ICD-10-CM | POA: Insufficient documentation

## 2023-02-01 DIAGNOSIS — R278 Other lack of coordination: Secondary | ICD-10-CM | POA: Insufficient documentation

## 2023-02-01 NOTE — Therapy (Signed)
OUTPATIENT PHYSICAL THERAPY NEURO EVALUATION   Patient Name: Britten Taubert MRN: [id number] DOB:01-21-1971, 52 y.o., female Today's Date: 02/01/2023   PCP: Kerin Salen, PA-C REFERRING PROVIDER: Kerin Salen, PA-C  END OF SESSION:  PT End of Session - 02/01/23 1234     Visit Number 1    Number of Visits 13   Plus eval   Date for PT Re-Evaluation 03/29/23    Authorization Type [employer]    PT Start Time 1231    PT Stop Time 1305    PT Time Calculation (min) 34 min    Activity Tolerance Other (comment);Patient tolerated treatment well    Behavior During Therapy Lability;Anxious             Past Medical History:  Diagnosis Date   Anemia    severe   Anxiety    Asthma    CKD (chronic kidney disease)    stage IV   DDD (degenerative disc disease), cervical    Depression    Diabetes mellitus (HCC)    Dislocated shoulder    right   GERD (gastroesophageal reflux disease)    Headache    migraines (about once a month)   History of kidney stones    Hypertension    Neuropathy    Peripheral vascular disease (HCC)    Pneumonia    Stroke [facility])    Past Surgical History:  Procedure Laterality Date   ADENOIDECTOMY     APPENDECTOMY     CERVICAL SPINE SURGERY  06/2012   C5-C6   EYE SURGERY     left eye surgery    FOOT SURGERY     KENALOG INJECTION Left 03/10/2021   Procedure: Intravitreal Avastin Injection;  Surgeon: Stephannie Li, MD;  Location: [facility] OR;  Service: Ophthalmology;  Laterality: Left;   LOOP RECORDER INSERTION N/A 08/27/2017   Procedure: LOOP RECORDER INSERTION;  Surgeon: Thurmon Fair, MD;  Location: [facility] INVASIVE CV LAB;  Service: Cardiovascular;  Laterality: N/A;   MEMBRANE PEEL Left 03/10/2021   Procedure: MEMBRANE PEEL;  Surgeon: Stephannie Li, MD;  Location: [facility] OR;  Service: Ophthalmology;  Laterality: Left;   PARS PLANA VITRECTOMY Left 03/10/2021   Procedure: TWENTY-FIVE GAUGE PARS PLANA VITRECTOMY;  Surgeon: Stephannie Li, MD;   Location: [facility] OR;  Service: Ophthalmology;  Laterality: Left;   PHOTOCOAGULATION WITH LASER Left 03/10/2021   Procedure: PHOTOCOAGULATION WITH LASER;  Surgeon: Stephannie Li, MD;  Location: [facility] OR;  Service: Ophthalmology;  Laterality: Left;   SHOULDER SURGERY     TEE WITHOUT CARDIOVERSION N/A 08/27/2017   Procedure: TRANSESOPHAGEAL ECHOCARDIOGRAM (TEE);  Surgeon: Thurmon Fair, MD;  Location: [facility] ENDOSCOPY;  Service: Cardiovascular;  Laterality: N/A;   TONSILLECTOMY     Patient Active Problem List   Diagnosis Date Noted   Diplopia 10/31/2022   OSA (obstructive sleep apnea) 08/25/2022   Subcortical infarction (HCC) 08/23/2022   Overweight (BMI 25.0-29.9) 08/23/2022   Asthma, chronic 08/22/2022   TIA (transient ischemic attack) 06/20/2022   Numbness and tingling in left hand 03/02/2022   Gait disturbance, post-stroke 03/02/2022   AKI (acute kidney injury) (HCC) 12/23/2021   Acute embolic stroke (HCC) 12/23/2021   Hypernatremia 12/20/2021   Chronic kidney disease (CKD), stage IV (severe) (HCC) 12/19/2021   Left-sided weakness and visual deficits  12/19/2021   Vision abnormalities 12/19/2021   Acute stroke due to ischemia [facility]) 11/10/2021   Depression 07/05/2020   CVA (cerebral vascular accident) (HCC) 06/21/2020   CVA (cerebrovascular accident) (HCC) 06/19/2020   Generalized anxiety  disorder 03/01/2019   Abnormality of gait 10/14/2018   Dizziness and giddiness 09/13/2018   Debility 06/26/2018   Chronic nonintractable headache 04/26/2018   Diastolic dysfunction    Congestion of nasal sinus    Pneumonia of left lower lobe due to infectious organism    SOB (shortness of breath)    Candidiasis    Anemia    Hypoalbuminemia due to protein-calorie malnutrition (HCC)    Hypertension    Type 2 diabetes mellitus with chronic kidney disease, without long-term current use of insulin (HCC)    PRES (posterior reversible encephalopathy syndrome)    Hypokalemia    History of CVA with  residual deficit    History of seizure 03/25/2018   Bilateral carpal tunnel syndrome 02/14/2018   Arthritis 10/31/2017   Asthma 10/31/2017   Constipation 10/31/2017   Kidney stone 10/31/2017   Anemia, chronic disease 08/09/2017   Hyperlipidemia 07/26/2017   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HCC) 07/25/2017   Anxiety as acute reaction to exceptional stress 04/23/2017   Insomnia 10/08/2016   Neuropathy 06/07/2016   Gait instability    Lesion of pons    Slurred speech    Vertigo 04/30/2016   Hyperglycemia 04/30/2016   Chronic kidney disease (CKD), stage III (moderate) (HCC) 04/30/2016   Ataxia 04/30/2016   Chronic right shoulder pain 04/03/2016   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   Rhinitis, allergic 08/25/2015   Asthma, mild intermittent 07/14/2015   Gastroesophageal reflux disease without esophagitis 06/08/2015   Hypertensive cardiovascular disease 05/05/2015   Vitamin D deficiency 05/05/2015    ONSET DATE: 01/18/2023  REFERRING DIAG: I63.9 (ICD-10-CM) - Recurrent strokes (HCC)  THERAPY DIAG:  Other lack of coordination  Other abnormalities of gait and mobility  Unsteadiness on feet  Repeated falls  Rationale for Evaluation and Treatment: Rehabilitation  SUBJECTIVE:                                                                                                                                                                                             SUBJECTIVE STATEMENT: Pt reports since January, she has been having "incidents" every other month. Last "incident" was on 7/28, in which she fell face first into the wall. Was discharged from the hospital on Sunday and had a fall when she got home while trying to get into her bedroom. States her rollator does not fit in her bedroom. Working on getting a home aide to assist w/ADLs at home. Has a brother that lives nearby but his work schedule does not allow him to assist more than 1x/week. Pt very emotional throughout subjective,  speaking about her family. States she started  Prozac for her "emotional issues" but was told it would take a while. "That incident in March messed with the part of my brain that manages my emotions". Pt reporting she frequently "fights with the devil" and he "is pulling on my stomach".    Pt accompanied by: self  PERTINENT HISTORY:  recurrent stroke (ILR placed 2019 removed 07/2019 after no atrial arrhythmias noted), seizure, vertigo. Scheduled to have cataract surgery in October   PAIN:  Are you having pain? No  PRECAUTIONS: Fall  RED FLAGS: None   WEIGHT BEARING RESTRICTIONS: No  FALLS: Has patient fallen in last 6 months? Yes. Number of falls >10   LIVING ENVIRONMENT: Lives with: lives alone Lives in: House/apartment Stairs: No Has following equipment at home: Single point cane, Environmental consultant - 4 wheeled, Shower bench, bed side commode, and Ramped entry  PLOF: Requires assistive device for independence, Needs assistance with ADLs, and Needs assistance with homemaking  PATIENT GOALS: "To be able to walk without help (cannot remember the last time she could do this)"  OBJECTIVE:   DIAGNOSTIC FINDINGS: CT of brain from 10/30/22 FINDINGS: Brain: No acute hemorrhage. Acute infarcts seen on the previous day's MRI brain are not resolved by CT. Unchanged chronic small-vessel disease and old lacunar infarcts in the bilateral basal ganglia and thalami. Cortical gray-white differentiation is otherwise preserved. Prominence of the ventricles and sulci within expected range for age. No hydrocephalus or extra-axial collection. No mass effect or midline shift.  MRI of brain on 10/28/22 IMPRESSION: 1. Positive for punctate acute right temporal lobe white matter lacunar infarct. And two small subacute appearing white matter infarcts in the Left corona radiata, near the left splenium. No associated hemorrhage or mass effect. 2. Underlying chronic severe small vessel disease, including numerous  chronic microhemorrhages in the brainstem with Wallerian degeneration. Conspicuous periventricular white matter involvement, although chronic demyelinating disease is felt less likely. 3. MRA today is reported separately.    COGNITION: Overall cognitive status: Difficulty to assess due to: no family present   SENSATION: Denies numbness/tingling of BUE/BLEs   EDEMA: Occasional swelling due to kidney failure. Pt refusing dialysis    POSTURE: rounded shoulders and forward head   LOWER EXTREMITY MMT:  Tested in seated position   MMT Right Eval Left Eval  Hip flexion 4+ 4+  Hip extension    Hip abduction 5 5  Hip adduction 5 5  Hip internal rotation    Hip external rotation    Knee flexion 5 5  Knee extension 5 5  Ankle dorsiflexion 5 5  Ankle plantarflexion    Ankle inversion    Ankle eversion    (Blank rows = not tested)  BED MOBILITY:  Independent per pt   TRANSFERS: Assistive device utilized: Environmental consultant - 4 wheeled  Sit to stand: SBA Stand to sit: SBA BUE support, bracing against back of chair   GAIT: Gait pattern: step through pattern, decreased step length- Right, decreased stance time- Left, decreased stride length, decreased hip/knee flexion- Right, decreased hip/knee flexion- Left, ataxic, antalgic, lateral hip instability, wide BOS, and poor foot clearance- Right Distance walked: Various clinic distances  Assistive device utilized: Walker - 4 wheeled Level of assistance: SBA Comments: Pt demonstrates ataxic-like pattern on RLE and frequently trips over R side of walker and her own foot. Pt unaware that she is tripping over her RLE.   FUNCTIONAL TESTS:   [facility] PT Assessment - 02/01/23 1258       Transfers   Five time sit  to stand comments  15.47s   BUE support, bracing against chair     Ambulation/Gait   Gait velocity 32.8' over 14.66s = 2.24 ft/s w/rollator   Frequent tripping over RLE             VITALS Vitals:   02/01/23 1254  BP: 129/79   Pulse: 69     TODAY'S TREATMENT:       Next Session                                                                                                                           PATIENT EDUCATION: Education details: POC, eval findings, using cane in bedroom since her rollator does not fit  Person educated: Patient Education method: Explanation and Demonstration Education comprehension: verbalized understanding and needs further education  HOME EXERCISE PROGRAM: To be reviewed from previous POC Access Code: [email or web address]   GOALS: Goals reviewed with patient? Yes  SHORT TERM GOALS: Target date: 03/01/2023    Pt will be independent with initial HEP for improved strength, balance, transfers and gait.  Baseline: to be reviewed from previous POC Goal status: INITIAL  2.  MCTSIB to be assessed and STG/LTG updated  Baseline:  Goal status: INITIAL  3.  Berg to be assessed and STG/LTG updated  Baseline:  Goal status: INITIAL  4.  Pt will improve gait velocity to at least 2.4 ft/s w/LRAD and SBA for improved gait efficiency and safety  Baseline: 2.24 ft/s w/rollator  Goal status: INITIAL   LONG TERM GOALS: Target date: 03/15/2023   Pt will be independent with final HEP for improved strength, balance, transfers and gait.  Baseline:  Goal status: INITIAL  2.  Pt will improve 5 x STS to less than or equal to 13 seconds w/proper form to demonstrate improved functional strength and transfer efficiency.   Baseline: 15.47s w/BUE support and bracing against chair  Goal status: INITIAL  3.  MCSTIB goal  Baseline:  Goal status: INITIAL  4.  Berg goal  Baseline:  Goal status: INITIAL  5.  Pt will improve gait velocity to at least 2.6 ft/s w/LRAD mod I for improved gait efficiency and indepdence  Baseline:  Goal status: INITIAL   ASSESSMENT:  CLINICAL IMPRESSION: Patient is a 52 year old female referred to Neuro OPPT for recurrent strokes. Pt's PMH is significant for:   hypertension, OSA,  severe diabetes, recurrent stroke (ILR placed 2019 removed 07/2019 after no atrial arrhythmias noted), seizure, CKD IV, asthma, vertigo. The following deficits were present during the exam: emotional lability, cognitive deficits, decreased safety awareness, impaired vision, ataxia and decreased functional strength. Based on falls history, impaired cognition and impaired vision, pt is an incr risk for falls. Pt would benefit from skilled PT to address these impairments and functional limitations to maximize functional mobility independence.    OBJECTIVE IMPAIRMENTS: Abnormal gait, decreased balance, decreased cognition, decreased coordination, decreased knowledge of condition, decreased knowledge of use of DME, decreased mobility, difficulty  walking, decreased safety awareness, increased edema, impaired perceived functional ability, impaired UE functional use, and impaired vision/preception  ACTIVITY LIMITATIONS: carrying, lifting, squatting, stairs, locomotion level, and caring for others  PARTICIPATION LIMITATIONS: meal prep, cleaning, laundry, interpersonal relationship, driving, shopping, community activity, and yard work  PERSONAL FACTORS: Behavior pattern, Education, Fitness, Past/current experiences, Transportation, and 1-2 comorbidities: Recurrent strokes and CKD IV   are also affecting patient's functional outcome.   REHAB POTENTIAL: Fair due to lack of support at home, impaired cognition and chronicity of CVA  CLINICAL DECISION MAKING: Evolving/moderate complexity  EVALUATION COMPLEXITY: Moderate  PLAN:  PT FREQUENCY: 2x/week  PT DURATION: 6 weeks (POC written for 8 weeks due to delay in scheduling)   PLANNED INTERVENTIONS: Therapeutic exercises, Therapeutic activity, Neuromuscular re-education, Balance training, Gait training, Patient/Family education, Self Care, Joint mobilization, Stair training, Vestibular training, Canalith repositioning, DME instructions,  Aquatic Therapy, Dry Needling, Electrical stimulation, Manual therapy, and Re-evaluation  PLAN FOR NEXT SESSION: MCTSIB, Berg and update goals. Review HEP from previous POC. LE coordination, practice w/cane in clinic. Scifit for endurance. Check BP    Sophonie Goforth E Quadre Bristol, PT, DPT 02/01/2023, 1:14 PM

## 2023-02-16 ENCOUNTER — Encounter (HOSPITAL_COMMUNITY): Payer: Self-pay

## 2023-02-19 ENCOUNTER — Ambulatory Visit: Payer: 59 | Attending: Internal Medicine | Admitting: Physical Therapy

## 2023-02-19 ENCOUNTER — Telehealth: Payer: Self-pay | Admitting: Physical Therapy

## 2023-02-19 VITALS — BP 108/71 | HR 68

## 2023-02-19 DIAGNOSIS — M6281 Muscle weakness (generalized): Secondary | ICD-10-CM | POA: Insufficient documentation

## 2023-02-19 DIAGNOSIS — R2681 Unsteadiness on feet: Secondary | ICD-10-CM | POA: Diagnosis present

## 2023-02-19 DIAGNOSIS — R296 Repeated falls: Secondary | ICD-10-CM | POA: Insufficient documentation

## 2023-02-19 DIAGNOSIS — R4701 Aphasia: Secondary | ICD-10-CM | POA: Insufficient documentation

## 2023-02-19 DIAGNOSIS — R2689 Other abnormalities of gait and mobility: Secondary | ICD-10-CM | POA: Diagnosis present

## 2023-02-19 DIAGNOSIS — R471 Dysarthria and anarthria: Secondary | ICD-10-CM | POA: Diagnosis present

## 2023-02-19 DIAGNOSIS — R278 Other lack of coordination: Secondary | ICD-10-CM | POA: Diagnosis present

## 2023-02-19 DIAGNOSIS — R41841 Cognitive communication deficit: Secondary | ICD-10-CM | POA: Diagnosis present

## 2023-02-19 NOTE — Therapy (Signed)
OUTPATIENT PHYSICAL THERAPY NEURO TREATMENT   Patient Name: Christina Orozco MRN: [id number] DOB:[birth year], 52 y.o., female Today's Date: 02/19/2023   PCP: Kerin Salen, PA-C REFERRING PROVIDER: Kerin Salen, PA-C  END OF SESSION:  PT End of Session - 02/19/23 1234     Visit Number 2    Number of Visits 13   Plus eval   Date for PT Re-Evaluation 03/29/23    Authorization Type [employer]    PT Start Time 1232    PT Stop Time 1312    PT Time Calculation (min) 40 min    Equipment Utilized During Treatment Gait belt    Activity Tolerance Patient tolerated treatment well    Behavior During Therapy Flat affect             Past Medical History:  Diagnosis Date   Anemia    severe   Anxiety    Asthma    CKD (chronic kidney disease)    stage IV   DDD (degenerative disc disease), cervical    Depression    Diabetes mellitus (HCC)    Dislocated shoulder    right   GERD (gastroesophageal reflux disease)    Headache    migraines (about once a month)   History of kidney stones    Hypertension    Neuropathy    Peripheral vascular disease (HCC)    Pneumonia    Stroke [facility])    Past Surgical History:  Procedure Laterality Date   ADENOIDECTOMY     APPENDECTOMY     CERVICAL SPINE SURGERY  06/2012   C5-C6   EYE SURGERY     left eye surgery    FOOT SURGERY     KENALOG INJECTION Left 03/10/2021   Procedure: Intravitreal Avastin Injection;  Surgeon: Stephannie Li, MD;  Location: [facility] OR;  Service: Ophthalmology;  Laterality: Left;   LOOP RECORDER INSERTION N/A 08/27/2017   Procedure: LOOP RECORDER INSERTION;  Surgeon: Thurmon Fair, MD;  Location: [facility] INVASIVE CV LAB;  Service: Cardiovascular;  Laterality: N/A;   MEMBRANE PEEL Left 03/10/2021   Procedure: MEMBRANE PEEL;  Surgeon: Stephannie Li, MD;  Location: [facility] OR;  Service: Ophthalmology;  Laterality: Left;   PARS PLANA VITRECTOMY Left 03/10/2021   Procedure: TWENTY-FIVE GAUGE PARS PLANA VITRECTOMY;   Surgeon: Stephannie Li, MD;  Location: [facility] OR;  Service: Ophthalmology;  Laterality: Left;   PHOTOCOAGULATION WITH LASER Left 03/10/2021   Procedure: PHOTOCOAGULATION WITH LASER;  Surgeon: Stephannie Li, MD;  Location: [facility] OR;  Service: Ophthalmology;  Laterality: Left;   SHOULDER SURGERY     TEE WITHOUT CARDIOVERSION N/A 08/27/2017   Procedure: TRANSESOPHAGEAL ECHOCARDIOGRAM (TEE);  Surgeon: Thurmon Fair, MD;  Location: [facility] ENDOSCOPY;  Service: Cardiovascular;  Laterality: N/A;   TONSILLECTOMY     Patient Active Problem List   Diagnosis Date Noted   Diplopia 10/31/2022   OSA (obstructive sleep apnea) 08/25/2022   Subcortical infarction (HCC) 08/23/2022   Overweight (BMI 25.0-29.9) 08/23/2022   Asthma, chronic 08/22/2022   TIA (transient ischemic attack) 06/20/2022   Numbness and tingling in left hand 03/02/2022   Gait disturbance, post-stroke 03/02/2022   AKI (acute kidney injury) (HCC) 12/23/2021   Acute embolic stroke (HCC) 12/23/2021   Hypernatremia 12/20/2021   Chronic kidney disease (CKD), stage IV (severe) (HCC) 12/19/2021   Left-sided weakness and visual deficits  12/19/2021   Vision abnormalities 12/19/2021   Acute stroke due to ischemia [facility]) 11/10/2021   Depression 07/05/2020   CVA (cerebral vascular accident) (HCC) 06/21/2020  CVA (cerebrovascular accident) (HCC) 06/19/2020   Generalized anxiety disorder 03/01/2019   Abnormality of gait 10/14/2018   Dizziness and giddiness 09/13/2018   Debility 06/26/2018   Chronic nonintractable headache 04/26/2018   Diastolic dysfunction    Congestion of nasal sinus    Pneumonia of left lower lobe due to infectious organism    SOB (shortness of breath)    Candidiasis    Anemia    Hypoalbuminemia due to protein-calorie malnutrition (HCC)    Hypertension    Type 2 diabetes mellitus with chronic kidney disease, without long-term current use of insulin (HCC)    PRES (posterior reversible encephalopathy syndrome)    Hypokalemia     History of CVA with residual deficit    History of seizure 03/25/2018   Bilateral carpal tunnel syndrome 02/14/2018   Arthritis 10/31/2017   Asthma 10/31/2017   Constipation 10/31/2017   Kidney stone 10/31/2017   Anemia, chronic disease 08/09/2017   Hyperlipidemia 07/26/2017   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HCC) 07/25/2017   Anxiety as acute reaction to exceptional stress 04/23/2017   Insomnia 10/08/2016   Neuropathy 06/07/2016   Gait instability    Lesion of pons    Slurred speech    Vertigo 04/30/2016   Hyperglycemia 04/30/2016   Chronic kidney disease (CKD), stage III (moderate) (HCC) 04/30/2016   Ataxia 04/30/2016   Chronic right shoulder pain 04/03/2016   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   Rhinitis, allergic 08/25/2015   Asthma, mild intermittent 07/14/2015   Gastroesophageal reflux disease without esophagitis 06/08/2015   Hypertensive cardiovascular disease 05/05/2015   Vitamin D deficiency 05/05/2015    ONSET DATE: 01/18/2023  REFERRING DIAG: I63.9 (ICD-10-CM) - Recurrent strokes (HCC)  THERAPY DIAG:  Other abnormalities of gait and mobility  Unsteadiness on feet  Other lack of coordination  Rationale for Evaluation and Treatment: Rehabilitation  SUBJECTIVE:                                                                                                                                                                                             SUBJECTIVE STATEMENT: Pt reports doing well. Denies falls or pain today. Wanting to get referral for SLP.    Pt accompanied by: self  PERTINENT HISTORY:  recurrent stroke (ILR placed 2019 removed 07/2019 after no atrial arrhythmias noted), seizure, vertigo. Scheduled to have cataract surgery in October   PAIN:  Are you having pain? No  PRECAUTIONS: Fall  RED FLAGS: None   WEIGHT BEARING RESTRICTIONS: No  FALLS: Has patient fallen in last 6 months? Yes. Number of falls >10   LIVING ENVIRONMENT: Lives with:  lives alone Lives in:  House/apartment Stairs: No Has following equipment at home: Single point cane, Walker - 4 wheeled, Shower bench, bed side commode, and Ramped entry  PLOF: Requires assistive device for independence, Needs assistance with ADLs, and Needs assistance with homemaking  PATIENT GOALS: "To be able to walk without help (cannot remember the last time she could do this)"  OBJECTIVE:   DIAGNOSTIC FINDINGS: CT of brain from 10/30/22 FINDINGS: Brain: No acute hemorrhage. Acute infarcts seen on the previous day's MRI brain are not resolved by CT. Unchanged chronic small-vessel disease and old lacunar infarcts in the bilateral basal ganglia and thalami. Cortical gray-white differentiation is otherwise preserved. Prominence of the ventricles and sulci within expected range for age. No hydrocephalus or extra-axial collection. No mass effect or midline shift.  MRI of brain on 10/28/22 IMPRESSION: 1. Positive for punctate acute right temporal lobe white matter lacunar infarct. And two small subacute appearing white matter infarcts in the Left corona radiata, near the left splenium. No associated hemorrhage or mass effect. 2. Underlying chronic severe small vessel disease, including numerous chronic microhemorrhages in the brainstem with Wallerian degeneration. Conspicuous periventricular white matter involvement, although chronic demyelinating disease is felt less likely. 3. MRA today is reported separately.    COGNITION: Overall cognitive status: Difficulty to assess due to: no family present   SENSATION: Denies numbness/tingling of BUE/BLEs   EDEMA: Occasional swelling due to kidney failure. Pt refusing dialysis    POSTURE: rounded shoulders and forward head   LOWER EXTREMITY MMT:  Tested in seated position   MMT Right Eval Left Eval  Hip flexion 4+ 4+  Hip extension    Hip abduction 5 5  Hip adduction 5 5  Hip internal rotation    Hip external rotation     Knee flexion 5 5  Knee extension 5 5  Ankle dorsiflexion 5 5  Ankle plantarflexion    Ankle inversion    Ankle eversion    (Blank rows = not tested)  VITALS  Vitals:   02/19/23 1238  BP: 108/71  Pulse: 68     TODAY'S TREATMENT:    Ther Act   [facility] PT Assessment - 02/19/23 1241       Balance   Balance Assessed Yes      Standardized Balance Assessment   Standardized Balance Assessment Berg Balance Test      Berg Balance Test   Sit to Stand Able to stand without using hands and stabilize independently    Standing Unsupported Able to stand safely 2 minutes    Sitting with Back Unsupported but Feet Supported on Floor or Stool Able to sit safely and securely 2 minutes    Stand to Sit Sits safely with minimal use of hands    Transfers Able to transfer safely, minor use of hands    Standing Unsupported with Eyes Closed Able to stand 10 seconds with supervision    Standing Unsupported with Feet Together Able to place feet together independently and stand for 1 minute with supervision   min anterolateral sway to L side   From Standing, Reach Forward with Outstretched Arm Can reach confidently >25 cm (10")    From Standing Position, Pick up Object from Floor Able to pick up shoe, needs supervision    From Standing Position, Turn to Look Behind Over each Shoulder Turn sideways only but maintains balance    Turn 360 Degrees Able to turn 360 degrees safely but slowly   6.53s   Standing Unsupported, Alternately Place Feet on  Step/Stool Able to complete >2 steps/needs minimal assist    Standing Unsupported, One Foot in [employer] balance while stepping or standing    Standing on One Leg Unable to try or needs assist to prevent fall    Total Score 38    Berg comment: Significant fall risk            Ther Ex  Reviewed and updated HEP (see bolded below) from previous POC for improved BLE strength:  Sit to stands w/no UE support, x10 reps. Pt assumes wide BOS due to instability  w/immediate standing balance but no LOB noted  At counter, standing hip extension x10 reps per side. Max multimodal cues to avoid forward truncal lean compensation, maintaining full knee extension and performing slowly, but pt continues to lean forward and kick backwards w/knee flexed.  Standing hip abduction at counter, x10 reps. Pt able to perform this well    GAIT: Gait pattern: step through pattern, decreased step length- Right, decreased stance time- Left, decreased stride length, decreased hip/knee flexion- Right, decreased hip/knee flexion- Left, ataxic, antalgic, lateral hip instability, wide BOS, and poor foot clearance- Right Distance walked: Various clinic distances  Assistive device utilized: Walker - 4 wheeled Level of assistance: SBA Comments: Pt demonstrates ataxic-like pattern on RLE and frequently trips over R side of walker and her own foot. Pt unaware that she is tripping over her RLE.    PATIENT EDUCATION: Education details: Scientist, research (medical), initial HEP  Person educated: Patient Education method: Explanation, Demonstration, and Handouts Education comprehension: verbalized understanding, returned demonstration, verbal cues required, and needs further education  HOME EXERCISE PROGRAM: Access Code: [email or web address] URL: [email or web address] Date: 02/19/2023 Prepared by: Alethia Berthold Lavarr President  Exercises - Sit to Stand Without Arm Support  - 1 x daily - 7 x weekly - 3 sets - 10 reps - Standing Hip Abduction with Counter Support  - 1 x daily - 7 x weekly - 3 sets - 10 reps - Standing Hip Extension with Counter Support  - 1 x daily - 7 x weekly - 3 sets - 10 reps  GOALS: Goals reviewed with patient? Yes  SHORT TERM GOALS: Target date: 03/01/2023    Pt will be independent with initial HEP for improved strength, balance, transfers and gait.  Baseline: to be reviewed from previous POC Goal status: INITIAL  2.  MCTSIB to be assessed and STG/LTG updated  Baseline:   Goal status: INITIAL  3.  Pt will improve Berg score to 42/56 for decreased fall risk  Baseline: 38/56 Goal status: REVISED   4.  Pt will improve gait velocity to at least 2.4 ft/s w/LRAD and SBA for improved gait efficiency and safety  Baseline: 2.24 ft/s w/rollator  Goal status: INITIAL   LONG TERM GOALS: Target date: 03/15/2023   Pt will be independent with final HEP for improved strength, balance, transfers and gait.  Baseline:  Goal status: INITIAL  2.  Pt will improve 5 x STS to less than or equal to 13 seconds w/proper form to demonstrate improved functional strength and transfer efficiency.   Baseline: 15.47s w/BUE support and bracing against chair  Goal status: INITIAL  3.  MCSTIB goal  Baseline:  Goal status: INITIAL  4.  Pt will improve Berg score to 46/56 for decreased fall risk  Baseline: 38/56 Goal status: REVISED   5.  Pt will improve gait velocity to at least 2.6 ft/s w/LRAD mod I for improved gait efficiency and indepdence  Baseline:  Goal status: INITIAL  ASSESSMENT:  CLINICAL IMPRESSION: Emphasis of skilled PT session on balance assessment and updating HEP. Pt scored a 38/56 on Berg, indicative of significant fall risk. Pt most challenged by narrow BOS, EC and single leg stability tasks. Pt required several seated rest breaks throughout session due to feelings of "being off", but vitals WNL. Pt unable to lay on her back for HEP, so gave standing exercises only. Continue POC.    OBJECTIVE IMPAIRMENTS: Abnormal gait, decreased balance, decreased cognition, decreased coordination, decreased knowledge of condition, decreased knowledge of use of DME, decreased mobility, difficulty walking, decreased safety awareness, increased edema, impaired perceived functional ability, impaired UE functional use, and impaired vision/preception  ACTIVITY LIMITATIONS: carrying, lifting, squatting, stairs, locomotion level, and caring for others  PARTICIPATION  LIMITATIONS: meal prep, cleaning, laundry, interpersonal relationship, driving, shopping, community activity, and yard work  PERSONAL FACTORS: Behavior pattern, Education, Fitness, Past/current experiences, Transportation, and 1-2 comorbidities: Recurrent strokes and CKD IV   are also affecting patient's functional outcome.   REHAB POTENTIAL: Fair due to lack of support at home, impaired cognition and chronicity of CVA  CLINICAL DECISION MAKING: Evolving/moderate complexity  EVALUATION COMPLEXITY: Moderate  PLAN:  PT FREQUENCY: 2x/week  PT DURATION: 6 weeks (POC written for 8 weeks due to delay in scheduling)   PLANNED INTERVENTIONS: Therapeutic exercises, Therapeutic activity, Neuromuscular re-education, Balance training, Gait training, Patient/Family education, Self Care, Joint mobilization, Stair training, Vestibular training, Canalith repositioning, DME instructions, Aquatic Therapy, Dry Needling, Electrical stimulation, Manual therapy, and Re-evaluation  PLAN FOR NEXT SESSION: MCTSIB and update goal. Add to HEP for improved narrow BOS, head turns, etc.. LE coordination, practice w/cane in clinic. Scifit for endurance. Check BP    Bret Stamour E Elchonon Maxson, PT, DPT 02/19/2023, 1:12 PM

## 2023-02-19 NOTE — Telephone Encounter (Signed)
FAXED ON 9/9  Michelle Nasuti, PA-C,    [facility] is being treated by PT for recurrent CVA. She would also benefit from a SLP evaluation for impaired speech and cognitive changes.    If you agree, please place an order in [facility] workque in [facility] or fax the order to [phone].  Thank you, Josephine Igo, PT, DPT [facility] [address] [place], [place]  [address] Phone:  [phone] Fax:  [phone]

## 2023-02-21 ENCOUNTER — Ambulatory Visit: Payer: 59

## 2023-02-21 VITALS — BP 131/71

## 2023-02-21 DIAGNOSIS — R2681 Unsteadiness on feet: Secondary | ICD-10-CM

## 2023-02-21 DIAGNOSIS — R2689 Other abnormalities of gait and mobility: Secondary | ICD-10-CM | POA: Diagnosis not present

## 2023-02-21 DIAGNOSIS — M6281 Muscle weakness (generalized): Secondary | ICD-10-CM

## 2023-02-21 DIAGNOSIS — R278 Other lack of coordination: Secondary | ICD-10-CM

## 2023-02-21 NOTE — Therapy (Signed)
OUTPATIENT PHYSICAL THERAPY NEURO TREATMENT   Patient Name: Christina Orozco MRN: [id number] DOB:[birth year], 52 y.o., female Today's Date: 02/21/2023   PCP: Kerin Salen, PA-C REFERRING PROVIDER: Kerin Salen, PA-C  END OF SESSION:  PT End of Session - 02/21/23 1409     Visit Number 3    Number of Visits 13    Date for PT Re-Evaluation 03/29/23    Authorization Type UHC    PT Start Time 1405   PT running late   PT Stop Time 1445    PT Time Calculation (min) 40 min    Equipment Utilized During Treatment Gait belt    Activity Tolerance Patient tolerated treatment well    Behavior During Therapy Flat affect             Past Medical History:  Diagnosis Date   Anemia    severe   Anxiety    Asthma    CKD (chronic kidney disease)    stage IV   DDD (degenerative disc disease), cervical    Depression    Diabetes mellitus (HCC)    Dislocated shoulder    right   GERD (gastroesophageal reflux disease)    Headache    migraines (about once a month)   History of kidney stones    Hypertension    Neuropathy    Peripheral vascular disease (HCC)    Pneumonia    Stroke [facility])    Past Surgical History:  Procedure Laterality Date   ADENOIDECTOMY     APPENDECTOMY     CERVICAL SPINE SURGERY  06/2012   C5-C6   EYE SURGERY     left eye surgery    FOOT SURGERY     KENALOG INJECTION Left 03/10/2021   Procedure: Intravitreal Avastin Injection;  Surgeon: Stephannie Li, MD;  Location: [facility] OR;  Service: Ophthalmology;  Laterality: Left;   LOOP RECORDER INSERTION N/A 08/27/2017   Procedure: LOOP RECORDER INSERTION;  Surgeon: Thurmon Fair, MD;  Location: [facility] INVASIVE CV LAB;  Service: Cardiovascular;  Laterality: N/A;   MEMBRANE PEEL Left 03/10/2021   Procedure: MEMBRANE PEEL;  Surgeon: Stephannie Li, MD;  Location: [facility] OR;  Service: Ophthalmology;  Laterality: Left;   PARS PLANA VITRECTOMY Left 03/10/2021   Procedure: TWENTY-FIVE GAUGE PARS PLANA  VITRECTOMY;  Surgeon: Stephannie Li, MD;  Location: [facility] OR;  Service: Ophthalmology;  Laterality: Left;   PHOTOCOAGULATION WITH LASER Left 03/10/2021   Procedure: PHOTOCOAGULATION WITH LASER;  Surgeon: Stephannie Li, MD;  Location: [facility] OR;  Service: Ophthalmology;  Laterality: Left;   SHOULDER SURGERY     TEE WITHOUT CARDIOVERSION N/A 08/27/2017   Procedure: TRANSESOPHAGEAL ECHOCARDIOGRAM (TEE);  Surgeon: Thurmon Fair, MD;  Location: [facility] ENDOSCOPY;  Service: Cardiovascular;  Laterality: N/A;   TONSILLECTOMY     Patient Active Problem List   Diagnosis Date Noted   Diplopia 10/31/2022   OSA (obstructive sleep apnea) 08/25/2022   Subcortical infarction (HCC) 08/23/2022   Overweight (BMI 25.0-29.9) 08/23/2022   Asthma, chronic 08/22/2022   TIA (transient ischemic attack) 06/20/2022   Numbness and tingling in left hand 03/02/2022   Gait disturbance, post-stroke 03/02/2022   AKI (acute kidney injury) (HCC) 12/23/2021   Acute embolic stroke (HCC) 12/23/2021   Hypernatremia 12/20/2021   Chronic kidney disease (CKD), stage IV (severe) (HCC) 12/19/2021   Left-sided weakness and visual deficits  12/19/2021   Vision abnormalities 12/19/2021   Acute stroke due to ischemia [facility]) 11/10/2021   Depression 07/05/2020   CVA (cerebral vascular accident) (HCC) 06/21/2020  CVA (cerebrovascular accident) (HCC) 06/19/2020   Generalized anxiety disorder 03/01/2019   Abnormality of gait 10/14/2018   Dizziness and giddiness 09/13/2018   Debility 06/26/2018   Chronic nonintractable headache 04/26/2018   Diastolic dysfunction    Congestion of nasal sinus    Pneumonia of left lower lobe due to infectious organism    SOB (shortness of breath)    Candidiasis    Anemia    Hypoalbuminemia due to protein-calorie malnutrition (HCC)    Hypertension    Type 2 diabetes mellitus with chronic kidney disease, without long-term current use of insulin (HCC)    PRES (posterior reversible encephalopathy syndrome)     Hypokalemia    History of CVA with residual deficit    History of seizure 03/25/2018   Bilateral carpal tunnel syndrome 02/14/2018   Arthritis 10/31/2017   Asthma 10/31/2017   Constipation 10/31/2017   Kidney stone 10/31/2017   Anemia, chronic disease 08/09/2017   Hyperlipidemia 07/26/2017   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HCC) 07/25/2017   Anxiety as acute reaction to exceptional stress 04/23/2017   Insomnia 10/08/2016   Neuropathy 06/07/2016   Gait instability    Lesion of pons    Slurred speech    Vertigo 04/30/2016   Hyperglycemia 04/30/2016   Chronic kidney disease (CKD), stage III (moderate) (HCC) 04/30/2016   Ataxia 04/30/2016   Chronic right shoulder pain 04/03/2016   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   Rhinitis, allergic 08/25/2015   Asthma, mild intermittent 07/14/2015   Gastroesophageal reflux disease without esophagitis 06/08/2015   Hypertensive cardiovascular disease 05/05/2015   Vitamin D deficiency 05/05/2015    ONSET DATE: 01/18/2023  REFERRING DIAG: I63.9 (ICD-10-CM) - Recurrent strokes (HCC)  THERAPY DIAG:  Other abnormalities of gait and mobility  Unsteadiness on feet  Other lack of coordination  Muscle weakness (generalized)  Rationale for Evaluation and Treatment: Rehabilitation  SUBJECTIVE:                                                                                                                                                                                             SUBJECTIVE STATEMENT: Patient arrives to clinic with rollator- doing well. Exercises going fine. Denies falls.   Pt accompanied by: self  PERTINENT HISTORY:  recurrent stroke (ILR placed 2019 removed 07/2019 after no atrial arrhythmias noted), seizure, vertigo. Scheduled to have cataract surgery in October   PAIN:  Are you having pain? No  PRECAUTIONS: Fall  PATIENT GOALS: "To be able to walk without help (cannot remember the last time she could do this)"  OBJECTIVE:    DIAGNOSTIC FINDINGS: CT of brain from 10/30/22 FINDINGS: Brain: No  acute hemorrhage. Acute infarcts seen on the previous day's MRI brain are not resolved by CT. Unchanged chronic small-vessel disease and old lacunar infarcts in the bilateral basal ganglia and thalami. Cortical gray-white differentiation is otherwise preserved. Prominence of the ventricles and sulci within expected range for age. No hydrocephalus or extra-axial collection. No mass effect or midline shift.  MRI of brain on 10/28/22 IMPRESSION: 1. Positive for punctate acute right temporal lobe white matter lacunar infarct. And two small subacute appearing white matter infarcts in the Left corona radiata, near the left splenium. No associated hemorrhage or mass effect. 2. Underlying chronic severe small vessel disease, including numerous chronic microhemorrhages in the brainstem with Wallerian degeneration. Conspicuous periventricular white matter involvement, although chronic demyelinating disease is felt less likely. 3. MRA today is reported separately.  VITALS  Vitals:   02/21/23 1412  BP: 131/71      TODAY'S TREATMENT:    NMR: MCTSIB: -condition 1: 30s mild sway -condition 2: 8s (LOB posterior R) -condition 3: 0s -condition 4: 0s  -scifit hills level 2 x10 mins B UE/LE for improved large amplitude reciprocal coordination  -balance HEP (noted below)  PATIENT EDUCATION: Education details: Sharlene Motts results, initial HEP  Person educated: Patient Education method: Explanation, Demonstration, and Handouts Education comprehension: verbalized understanding, returned demonstration, verbal cues required, and needs further education  HOME EXERCISE PROGRAM: Access Code: [email or web address] URL: [email or web address] Date: 02/19/2023 Prepared by: Alethia Berthold Plaster  Exercises - Sit to Stand Without Arm Support  - 1 x daily - 7 x weekly - 3 sets - 10 reps - Standing Hip Abduction with Counter Support  - 1 x  daily - 7 x weekly - 3 sets - 10 reps - Standing Hip Extension with Counter Support  - 1 x daily - 7 x weekly - 3 sets - 10 reps - Standing Tandem Balance with Counter Support  - 1 x daily - 7 x weekly - 3 sets - 30s hold - Standing March with Counter Support  - 1 x daily - 7 x weekly - 3 sets - 10 reps - Single Leg Stance with Support  - 1 x daily - 7 x weekly - 3 sets - 30s hold  GOALS: Goals reviewed with patient? Yes  SHORT TERM GOALS: Target date: 03/01/2023    Pt will be independent with initial HEP for improved strength, balance, transfers and gait.  Baseline: to be reviewed from previous POC Goal status: INITIAL  2.  Patient will complete >/= 15s of condition 2 of MCTSIB for improved balance Baseline: 8s Goal status: REVISED  3.  Pt will improve Berg score to 42/56 for decreased fall risk  Baseline: 38/56 Goal status: REVISED   4.  Pt will improve gait velocity to at least 2.4 ft/s w/LRAD and SBA for improved gait efficiency and safety  Baseline: 2.24 ft/s w/rollator  Goal status: INITIAL   LONG TERM GOALS: Target date: 03/15/2023   Pt will be independent with final HEP for improved strength, balance, transfers and gait.  Baseline:  Goal status: INITIAL  2.  Pt will improve 5 x STS to less than or equal to 13 seconds w/proper form to demonstrate improved functional strength and transfer efficiency.   Baseline: 15.47s w/BUE support and bracing against chair  Goal status: INITIAL  3.  Patient will complete >/=30s on condition 2 of MCTSIB to demonstrate improved balance Baseline: 8s Goal status: REVISED  4.  Pt will improve Berg score to 46/56 for decreased fall risk  Baseline:  38/56 Goal status: REVISED   5.  Pt will improve gait velocity to at least 2.6 ft/s w/LRAD mod I for improved gait efficiency and indepdence  Baseline:  Goal status: INITIAL   ASSESSMENT:  CLINICAL IMPRESSION: Patient seen for skilled PT session with emphasis on completing  balance assessment and prescribing HEP based on deficits. Unable to complete condition 2 of MCTSIB indicating poor static standing balance with vision occluded. Tolerated addition of balance HEP well with cues to maintain UE contact on surface for additional support and safety. Continue POC.    OBJECTIVE IMPAIRMENTS: Abnormal gait, decreased balance, decreased cognition, decreased coordination, decreased knowledge of condition, decreased knowledge of use of DME, decreased mobility, difficulty walking, decreased safety awareness, increased edema, impaired perceived functional ability, impaired UE functional use, and impaired vision/preception  ACTIVITY LIMITATIONS: carrying, lifting, squatting, stairs, locomotion level, and caring for others  PARTICIPATION LIMITATIONS: meal prep, cleaning, laundry, interpersonal relationship, driving, shopping, community activity, and yard work  PERSONAL FACTORS: Behavior pattern, Education, Fitness, Past/current experiences, Transportation, and 1-2 comorbidities: Recurrent strokes and CKD IV   are also affecting patient's functional outcome.   REHAB POTENTIAL: Fair due to lack of support at home, impaired cognition and chronicity of CVA  CLINICAL DECISION MAKING: Evolving/moderate complexity  EVALUATION COMPLEXITY: Moderate  PLAN:  PT FREQUENCY: 2x/week  PT DURATION: 6 weeks (POC written for 8 weeks due to delay in scheduling)   PLANNED INTERVENTIONS: Therapeutic exercises, Therapeutic activity, Neuromuscular re-education, Balance training, Gait training, Patient/Family education, Self Care, Joint mobilization, Stair training, Vestibular training, Canalith repositioning, DME instructions, Aquatic Therapy, Dry Needling, Electrical stimulation, Manual therapy, and Re-evaluation  PLAN FOR NEXT SESSION: MCTSIB and update goal. Add to HEP for improved narrow BOS, head turns, etc.. LE coordination, practice w/cane in clinic. Scifit for endurance. Check BP     Westley Foots, PT Westley Foots, PT, DPT, CBIS  02/21/2023, 2:48 PM

## 2023-02-22 ENCOUNTER — Encounter: Payer: 59 | Attending: Psychology | Admitting: Psychology

## 2023-02-22 DIAGNOSIS — R200 Anesthesia of skin: Secondary | ICD-10-CM | POA: Insufficient documentation

## 2023-02-22 DIAGNOSIS — F482 Pseudobulbar affect: Secondary | ICD-10-CM | POA: Insufficient documentation

## 2023-02-22 DIAGNOSIS — I639 Cerebral infarction, unspecified: Secondary | ICD-10-CM | POA: Diagnosis present

## 2023-02-22 DIAGNOSIS — I69398 Other sequelae of cerebral infarction: Secondary | ICD-10-CM | POA: Diagnosis not present

## 2023-02-22 DIAGNOSIS — R202 Paresthesia of skin: Secondary | ICD-10-CM | POA: Insufficient documentation

## 2023-02-22 DIAGNOSIS — R531 Weakness: Secondary | ICD-10-CM | POA: Diagnosis not present

## 2023-02-22 DIAGNOSIS — R269 Unspecified abnormalities of gait and mobility: Secondary | ICD-10-CM | POA: Insufficient documentation

## 2023-02-26 ENCOUNTER — Ambulatory Visit: Payer: 59 | Admitting: Physical Therapy

## 2023-02-27 ENCOUNTER — Ambulatory Visit: Payer: 59

## 2023-02-27 VITALS — BP 124/74

## 2023-02-27 DIAGNOSIS — R2681 Unsteadiness on feet: Secondary | ICD-10-CM

## 2023-02-27 DIAGNOSIS — R278 Other lack of coordination: Secondary | ICD-10-CM

## 2023-02-27 DIAGNOSIS — R2689 Other abnormalities of gait and mobility: Secondary | ICD-10-CM

## 2023-02-27 DIAGNOSIS — M6281 Muscle weakness (generalized): Secondary | ICD-10-CM

## 2023-02-27 NOTE — Therapy (Signed)
OUTPATIENT PHYSICAL THERAPY NEURO TREATMENT   Patient Name: Christina Orozco MRN: [id number] DOB:[birth year], 52 y.o., female Today's Date: 02/27/2023   PCP: Kerin Salen, PA-C REFERRING PROVIDER: Kerin Salen, PA-C  END OF SESSION:  PT End of Session - 02/27/23 1511     Visit Number 4    Number of Visits 13    Date for PT Re-Evaluation 03/29/23    Authorization Type UHC    PT Start Time 1515   agreeable to start early   PT Stop Time 1600    PT Time Calculation (min) 45 min    Equipment Utilized During Treatment Gait belt    Activity Tolerance Patient limited by pain    Behavior During Therapy WFL for tasks assessed/performed             Past Medical History:  Diagnosis Date   Anemia    severe   Anxiety    Asthma    CKD (chronic kidney disease)    stage IV   DDD (degenerative disc disease), cervical    Depression    Diabetes mellitus (HCC)    Dislocated shoulder    right   GERD (gastroesophageal reflux disease)    Headache    migraines (about once a month)   History of kidney stones    Hypertension    Neuropathy    Peripheral vascular disease (HCC)    Pneumonia    Stroke [facility])    Past Surgical History:  Procedure Laterality Date   ADENOIDECTOMY     APPENDECTOMY     CERVICAL SPINE SURGERY  06/2012   C5-C6   EYE SURGERY     left eye surgery    FOOT SURGERY     KENALOG INJECTION Left 03/10/2021   Procedure: Intravitreal Avastin Injection;  Surgeon: Stephannie Li, MD;  Location: [facility] OR;  Service: Ophthalmology;  Laterality: Left;   LOOP RECORDER INSERTION N/A 08/27/2017   Procedure: LOOP RECORDER INSERTION;  Surgeon: Thurmon Fair, MD;  Location: [facility] INVASIVE CV LAB;  Service: Cardiovascular;  Laterality: N/A;   MEMBRANE PEEL Left 03/10/2021   Procedure: MEMBRANE PEEL;  Surgeon: Stephannie Li, MD;  Location: [facility] OR;  Service: Ophthalmology;  Laterality: Left;   PARS PLANA VITRECTOMY Left 03/10/2021   Procedure: TWENTY-FIVE  GAUGE PARS PLANA VITRECTOMY;  Surgeon: Stephannie Li, MD;  Location: [facility] OR;  Service: Ophthalmology;  Laterality: Left;   PHOTOCOAGULATION WITH LASER Left 03/10/2021   Procedure: PHOTOCOAGULATION WITH LASER;  Surgeon: Stephannie Li, MD;  Location: [facility] OR;  Service: Ophthalmology;  Laterality: Left;   SHOULDER SURGERY     TEE WITHOUT CARDIOVERSION N/A 08/27/2017   Procedure: TRANSESOPHAGEAL ECHOCARDIOGRAM (TEE);  Surgeon: Thurmon Fair, MD;  Location: [facility] ENDOSCOPY;  Service: Cardiovascular;  Laterality: N/A;   TONSILLECTOMY     Patient Active Problem List   Diagnosis Date Noted   Diplopia 10/31/2022   OSA (obstructive sleep apnea) 08/25/2022   Subcortical infarction (HCC) 08/23/2022   Overweight (BMI 25.0-29.9) 08/23/2022   Asthma, chronic 08/22/2022   TIA (transient ischemic attack) 06/20/2022   Numbness and tingling in left hand 03/02/2022   Gait disturbance, post-stroke 03/02/2022   AKI (acute kidney injury) (HCC) 12/23/2021   Acute embolic stroke (HCC) 12/23/2021   Hypernatremia 12/20/2021   Chronic kidney disease (CKD), stage IV (severe) (HCC) 12/19/2021   Left-sided weakness and visual deficits  12/19/2021   Vision abnormalities 12/19/2021   Acute stroke due to ischemia [facility]) 11/10/2021   Depression 07/05/2020   CVA (cerebral vascular accident) (  HCC) 06/21/2020   CVA (cerebrovascular accident) (HCC) 06/19/2020   Generalized anxiety disorder 03/01/2019   Abnormality of gait 10/14/2018   Dizziness and giddiness 09/13/2018   Debility 06/26/2018   Chronic nonintractable headache 04/26/2018   Diastolic dysfunction    Congestion of nasal sinus    Pneumonia of left lower lobe due to infectious organism    SOB (shortness of breath)    Candidiasis    Anemia    Hypoalbuminemia due to protein-calorie malnutrition (HCC)    Hypertension    Type 2 diabetes mellitus with chronic kidney disease, without long-term current use of insulin (HCC)    PRES (posterior reversible  encephalopathy syndrome)    Hypokalemia    History of CVA with residual deficit    History of seizure 03/25/2018   Bilateral carpal tunnel syndrome 02/14/2018   Arthritis 10/31/2017   Asthma 10/31/2017   Constipation 10/31/2017   Kidney stone 10/31/2017   Anemia, chronic disease 08/09/2017   Hyperlipidemia 07/26/2017   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HCC) 07/25/2017   Anxiety as acute reaction to exceptional stress 04/23/2017   Insomnia 10/08/2016   Neuropathy 06/07/2016   Gait instability    Lesion of pons    Slurred speech    Vertigo 04/30/2016   Hyperglycemia 04/30/2016   Chronic kidney disease (CKD), stage III (moderate) (HCC) 04/30/2016   Ataxia 04/30/2016   Chronic right shoulder pain 04/03/2016   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   Rhinitis, allergic 08/25/2015   Asthma, mild intermittent 07/14/2015   Gastroesophageal reflux disease without esophagitis 06/08/2015   Hypertensive cardiovascular disease 05/05/2015   Vitamin D deficiency 05/05/2015    ONSET DATE: 01/18/2023  REFERRING DIAG: I63.9 (ICD-10-CM) - Recurrent strokes (HCC)  THERAPY DIAG:  Other abnormalities of gait and mobility  Unsteadiness on feet  Other lack of coordination  Muscle weakness (generalized)  Rationale for Evaluation and Treatment: Rehabilitation  SUBJECTIVE:                                                                                                                                                                                             SUBJECTIVE STATEMENT: Patient arrives to clinic with rollator-doing fair. Reports pain in L thigh "like a cramp." Denies falls or injury to leg. States that biofreeze helps intermittently.   Pt accompanied by: self  PERTINENT HISTORY:  recurrent stroke (ILR placed 2019 removed 07/2019 after no atrial arrhythmias noted), seizure, vertigo. Scheduled to have cataract surgery in October   PAIN:  Are you having pain? Yes: NPRS scale: 10/10 Pain  location: L thigh Pain description: "like a cramp"  PRECAUTIONS: Fall  PATIENT GOALS: "To  be able to walk without help (cannot remember the last time she could do this)"  OBJECTIVE:   DIAGNOSTIC FINDINGS: CT of brain from 10/30/22 FINDINGS: Brain: No acute hemorrhage. Acute infarcts seen on the previous day's MRI brain are not resolved by CT. Unchanged chronic small-vessel disease and old lacunar infarcts in the bilateral basal ganglia and thalami. Cortical gray-white differentiation is otherwise preserved. Prominence of the ventricles and sulci within expected range for age. No hydrocephalus or extra-axial collection. No mass effect or midline shift.  MRI of brain on 10/28/22 IMPRESSION: 1. Positive for punctate acute right temporal lobe white matter lacunar infarct. And two small subacute appearing white matter infarcts in the Left corona radiata, near the left splenium. No associated hemorrhage or mass effect. 2. Underlying chronic severe small vessel disease, including numerous chronic microhemorrhages in the brainstem with Wallerian degeneration. Conspicuous periventricular white matter involvement, although chronic demyelinating disease is felt less likely. 3. MRA today is reported separately.  VITALS  Vitals:   02/27/23 1522  BP: 124/74       TODAY'S TREATMENT:    NMR: -hooklying bridges  -U LE hooklying bridges -unable to tolerate straight L LE when supine, but (-) slump test B  -unable to tolerate thomas test/stretch  - scifit level 2 x10 mins B LE/UE   -no increase in pain  GAIT: -115' rollator + CGA   -noted minimal B knee flexion and ataxic gait pattern  -115' rollator + CGA + 3# B ankle weights for improved proprioceptive input   -minimized ataxia, remained with minimal B knee flexion -attempted to have patient over exaggerate knee flexion with gait, but patient with significant increase in L thigh pain and s discontinued   PATIENT  EDUCATION: Education details: f/u with PCP re: LE pain  Person educated: Patient Education method: Explanation, Demonstration, and Handouts Education comprehension: verbalized understanding, returned demonstration, verbal cues required, and needs further education  HOME EXERCISE PROGRAM: Access Code: [email or web address] URL: [email or web address] Date: 02/19/2023 Prepared by: Alethia Berthold Plaster  Exercises - Sit to Stand Without Arm Support  - 1 x daily - 7 x weekly - 3 sets - 10 reps - Standing Hip Abduction with Counter Support  - 1 x daily - 7 x weekly - 3 sets - 10 reps - Standing Hip Extension with Counter Support  - 1 x daily - 7 x weekly - 3 sets - 10 reps - Standing Tandem Balance with Counter Support  - 1 x daily - 7 x weekly - 3 sets - 30s hold - Standing March with Counter Support  - 1 x daily - 7 x weekly - 3 sets - 10 reps - Single Leg Stance with Support  - 1 x daily - 7 x weekly - 3 sets - 30s hold  GOALS: Goals reviewed with patient? Yes  SHORT TERM GOALS: Target date: 03/01/2023    Pt will be independent with initial HEP for improved strength, balance, transfers and gait.  Baseline: to be reviewed from previous POC Goal status: INITIAL  2.  Patient will complete >/= 15s of condition 2 of MCTSIB for improved balance Baseline: 8s Goal status: REVISED  3.  Pt will improve Berg score to 42/56 for decreased fall risk  Baseline: 38/56 Goal status: REVISED   4.  Pt will improve gait velocity to at least 2.4 ft/s w/LRAD and SBA for improved gait efficiency and safety  Baseline: 2.24 ft/s w/rollator  Goal status: INITIAL   LONG TERM GOALS: Target date: 03/15/2023  Pt will be independent with final HEP for improved strength, balance, transfers and gait.  Baseline:  Goal status: INITIAL  2.  Pt will improve 5 x STS to less than or equal to 13 seconds w/proper form to demonstrate improved functional strength and transfer efficiency.   Baseline: 15.47s w/BUE  support and bracing against chair  Goal status: INITIAL  3.  Patient will complete >/=30s on condition 2 of MCTSIB to demonstrate improved balance Baseline: 8s Goal status: REVISED  4.  Pt will improve Berg score to 46/56 for decreased fall risk  Baseline: 38/56 Goal status: REVISED   5.  Pt will improve gait velocity to at least 2.6 ft/s w/LRAD mod I for improved gait efficiency and indepdence  Baseline:  Goal status: INITIAL   ASSESSMENT:  CLINICAL IMPRESSION: Patient seen for skilled PT session with emphasis on gross NMR and gait retraining. Session limited by ambiguous L thigh pain without MOI. She rates pain at 10/10 at beginning of session, but was agreeable to continue. Negative slump test, noted extension bias, unable to tolerate thomas test/stretch, unable to tolerate stork test. PT unclear on etiology. Continue POC as able.    OBJECTIVE IMPAIRMENTS: Abnormal gait, decreased balance, decreased cognition, decreased coordination, decreased knowledge of condition, decreased knowledge of use of DME, decreased mobility, difficulty walking, decreased safety awareness, increased edema, impaired perceived functional ability, impaired UE functional use, and impaired vision/preception  ACTIVITY LIMITATIONS: carrying, lifting, squatting, stairs, locomotion level, and caring for others  PARTICIPATION LIMITATIONS: meal prep, cleaning, laundry, interpersonal relationship, driving, shopping, community activity, and yard work  PERSONAL FACTORS: Behavior pattern, Education, Fitness, Past/current experiences, Transportation, and 1-2 comorbidities: Recurrent strokes and CKD IV   are also affecting patient's functional outcome.   REHAB POTENTIAL: Fair due to lack of support at home, impaired cognition and chronicity of CVA  CLINICAL DECISION MAKING: Evolving/moderate complexity  EVALUATION COMPLEXITY: Moderate  PLAN:  PT FREQUENCY: 2x/week  PT DURATION: 6 weeks (POC written for 8 weeks  due to delay in scheduling)   PLANNED INTERVENTIONS: Therapeutic exercises, Therapeutic activity, Neuromuscular re-education, Balance training, Gait training, Patient/Family education, Self Care, Joint mobilization, Stair training, Vestibular training, Canalith repositioning, DME instructions, Aquatic Therapy, Dry Needling, Electrical stimulation, Manual therapy, and Re-evaluation  PLAN FOR NEXT SESSION: Add to HEP for improved narrow BOS, head turns, etc.. LE coordination, practice w/cane in clinic. Scifit for endurance. Check BP    Westley Foots, PT Westley Foots, PT, DPT, CBIS  02/27/2023, 4:07 PM

## 2023-02-28 ENCOUNTER — Ambulatory Visit: Payer: 59 | Admitting: Physical Therapy

## 2023-03-01 ENCOUNTER — Ambulatory Visit: Payer: 59

## 2023-03-01 ENCOUNTER — Telehealth: Payer: Self-pay

## 2023-03-01 DIAGNOSIS — M6281 Muscle weakness (generalized): Secondary | ICD-10-CM

## 2023-03-01 DIAGNOSIS — R2681 Unsteadiness on feet: Secondary | ICD-10-CM

## 2023-03-01 DIAGNOSIS — R278 Other lack of coordination: Secondary | ICD-10-CM

## 2023-03-01 DIAGNOSIS — R2689 Other abnormalities of gait and mobility: Secondary | ICD-10-CM

## 2023-03-01 NOTE — Telephone Encounter (Signed)
FAXED ON: 03/01/23  Quin Hoop, PA-C,   [facility] is being treated by PT. The patient would benefit from an OT evaluation for UE strength/coordination.    If you agree, please place an order in [facility] workque in [facility] or fax the order to [phone].  Thank you, Westley Foots, PT, DPT, [facility] [address] [place], [place]  [address] Phone:  [phone] Fax:  [phone]

## 2023-03-01 NOTE — Therapy (Signed)
OUTPATIENT PHYSICAL THERAPY NEURO TREATMENT- ARRIVED NO CHARGE   Patient Name: Christina Orozco MRN: [id number] DOB:1970-11-03, 52 y.o., female Today's Date: 03/01/2023   PCP: Kerin Salen, PA-C REFERRING PROVIDER: Kerin Salen, PA-C  END OF SESSION:  PT End of Session - 03/01/23 0857     Visit Number 4    Number of Visits 13    Date for PT Re-Evaluation 03/29/23    Authorization Type [employer]    PT Start Time [phone]    PT Stop Time 0857   arrived no charge   PT Time Calculation (min) 11 min    Activity Tolerance Patient limited by pain;Treatment limited secondary to medical complications (Comment)   being worked up for DVTs   Behavior During Therapy WFL for tasks assessed/performed             Past Medical History:  Diagnosis Date   Anemia    severe   Anxiety    Asthma    CKD (chronic kidney disease)    stage IV   DDD (degenerative disc disease), cervical    Depression    Diabetes mellitus (HCC)    Dislocated shoulder    right   GERD (gastroesophageal reflux disease)    Headache    migraines (about once a month)   History of kidney stones    Hypertension    Neuropathy    Peripheral vascular disease (HCC)    Pneumonia    Stroke [facility])    Past Surgical History:  Procedure Laterality Date   ADENOIDECTOMY     APPENDECTOMY     CERVICAL SPINE SURGERY  06/2012   C5-C6   EYE SURGERY     left eye surgery    FOOT SURGERY     KENALOG INJECTION Left 03/10/2021   Procedure: Intravitreal Avastin Injection;  Surgeon: Stephannie Li, MD;  Location: [facility] OR;  Service: Ophthalmology;  Laterality: Left;   LOOP RECORDER INSERTION N/A 08/27/2017   Procedure: LOOP RECORDER INSERTION;  Surgeon: Thurmon Fair, MD;  Location: [facility] INVASIVE CV LAB;  Service: Cardiovascular;  Laterality: N/A;   MEMBRANE PEEL Left 03/10/2021   Procedure: MEMBRANE PEEL;  Surgeon: Stephannie Li, MD;  Location: [facility] OR;  Service: Ophthalmology;  Laterality: Left;   PARS PLANA  VITRECTOMY Left 03/10/2021   Procedure: TWENTY-FIVE GAUGE PARS PLANA VITRECTOMY;  Surgeon: Stephannie Li, MD;  Location: [facility] OR;  Service: Ophthalmology;  Laterality: Left;   PHOTOCOAGULATION WITH LASER Left 03/10/2021   Procedure: PHOTOCOAGULATION WITH LASER;  Surgeon: Stephannie Li, MD;  Location: [facility] OR;  Service: Ophthalmology;  Laterality: Left;   SHOULDER SURGERY     TEE WITHOUT CARDIOVERSION N/A 08/27/2017   Procedure: TRANSESOPHAGEAL ECHOCARDIOGRAM (TEE);  Surgeon: Thurmon Fair, MD;  Location: [facility] ENDOSCOPY;  Service: Cardiovascular;  Laterality: N/A;   TONSILLECTOMY     Patient Active Problem List   Diagnosis Date Noted   Diplopia 10/31/2022   OSA (obstructive sleep apnea) 08/25/2022   Subcortical infarction (HCC) 08/23/2022   Overweight (BMI 25.0-29.9) 08/23/2022   Asthma, chronic 08/22/2022   TIA (transient ischemic attack) 06/20/2022   Numbness and tingling in left hand 03/02/2022   Gait disturbance, post-stroke 03/02/2022   AKI (acute kidney injury) (HCC) 12/23/2021   Acute embolic stroke (HCC) 12/23/2021   Hypernatremia 12/20/2021   Chronic kidney disease (CKD), stage IV (severe) (HCC) 12/19/2021   Left-sided weakness and visual deficits  12/19/2021   Vision abnormalities 12/19/2021   Acute stroke due to ischemia [facility]) 11/10/2021   Depression 07/05/2020  CVA (cerebral vascular accident) (HCC) 06/21/2020   CVA (cerebrovascular accident) (HCC) 06/19/2020   Generalized anxiety disorder 03/01/2019   Abnormality of gait 10/14/2018   Dizziness and giddiness 09/13/2018   Debility 06/26/2018   Chronic nonintractable headache 04/26/2018   Diastolic dysfunction    Congestion of nasal sinus    Pneumonia of left lower lobe due to infectious organism    SOB (shortness of breath)    Candidiasis    Anemia    Hypoalbuminemia due to protein-calorie malnutrition (HCC)    Hypertension    Type 2 diabetes mellitus with chronic kidney disease, without long-term current use of  insulin (HCC)    PRES (posterior reversible encephalopathy syndrome)    Hypokalemia    History of CVA with residual deficit    History of seizure 03/25/2018   Bilateral carpal tunnel syndrome 02/14/2018   Arthritis 10/31/2017   Asthma 10/31/2017   Constipation 10/31/2017   Kidney stone 10/31/2017   Anemia, chronic disease 08/09/2017   Hyperlipidemia 07/26/2017   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HCC) 07/25/2017   Anxiety as acute reaction to exceptional stress 04/23/2017   Insomnia 10/08/2016   Neuropathy 06/07/2016   Gait instability    Lesion of pons    Slurred speech    Vertigo 04/30/2016   Hyperglycemia 04/30/2016   Chronic kidney disease (CKD), stage III (moderate) (HCC) 04/30/2016   Ataxia 04/30/2016   Chronic right shoulder pain 04/03/2016   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   Rhinitis, allergic 08/25/2015   Asthma, mild intermittent 07/14/2015   Gastroesophageal reflux disease without esophagitis 06/08/2015   Hypertensive cardiovascular disease 05/05/2015   Vitamin D deficiency 05/05/2015    ONSET DATE: 01/18/2023  REFERRING DIAG: I63.9 (ICD-10-CM) - Recurrent strokes (HCC)  THERAPY DIAG:  Other abnormalities of gait and mobility  Unsteadiness on feet  Other lack of coordination  Muscle weakness (generalized)  Rationale for Evaluation and Treatment: Rehabilitation  SUBJECTIVE:                                                                                                                                                                                             SUBJECTIVE STATEMENT: Patient arrives to clinic with rollator. Reports doing fair, but states that her PCP is working her up for potential blood clots in her R UE and L LE. Denies falls.   Pt accompanied by: self  PERTINENT HISTORY:  recurrent stroke (ILR placed 2019 removed 07/2019 after no atrial arrhythmias noted), seizure, vertigo. Scheduled to have cataract surgery in October   PAIN:  Are you  having pain? Yes: NPRS scale: 10/10 Pain location: L thigh Pain description: "like  a cramp"  PRECAUTIONS: Fall  PATIENT GOALS: "To be able to walk without help (cannot remember the last time she could do this)"  OBJECTIVE:   DIAGNOSTIC FINDINGS: CT of brain from 10/30/22 FINDINGS: Brain: No acute hemorrhage. Acute infarcts seen on the previous day's MRI brain are not resolved by CT. Unchanged chronic small-vessel disease and old lacunar infarcts in the bilateral basal ganglia and thalami. Cortical gray-white differentiation is otherwise preserved. Prominence of the ventricles and sulci within expected range for age. No hydrocephalus or extra-axial collection. No mass effect or midline shift.  MRI of brain on 10/28/22 IMPRESSION: 1. Positive for punctate acute right temporal lobe white matter lacunar infarct. And two small subacute appearing white matter infarcts in the Left corona radiata, near the left splenium. No associated hemorrhage or mass effect. 2. Underlying chronic severe small vessel disease, including numerous chronic microhemorrhages in the brainstem with Wallerian degeneration. Conspicuous periventricular white matter involvement, although chronic demyelinating disease is felt less likely. 3. MRA today is reported separately.  VITALS  There were no vitals filed for this visit.      TODAY'S TREATMENT:    Arrived no charge  PATIENT EDUCATION: Education details: f/u with PCP re: LE pain  Person educated: Patient Education method: Explanation, Demonstration, and Handouts Education comprehension: verbalized understanding, returned demonstration, verbal cues required, and needs further education  HOME EXERCISE PROGRAM: Access Code: [email or web address] URL: [email or web address] Date: 02/19/2023 Prepared by: Alethia Berthold Plaster  Exercises - Sit to Stand Without Arm Support  - 1 x daily - 7 x weekly - 3 sets - 10 reps - Standing Hip Abduction with Counter  Support  - 1 x daily - 7 x weekly - 3 sets - 10 reps - Standing Hip Extension with Counter Support  - 1 x daily - 7 x weekly - 3 sets - 10 reps - Standing Tandem Balance with Counter Support  - 1 x daily - 7 x weekly - 3 sets - 30s hold - Standing March with Counter Support  - 1 x daily - 7 x weekly - 3 sets - 10 reps - Single Leg Stance with Support  - 1 x daily - 7 x weekly - 3 sets - 30s hold  GOALS: Goals reviewed with patient? Yes  SHORT TERM GOALS: Target date: 03/01/2023    Pt will be independent with initial HEP for improved strength, balance, transfers and gait.  Baseline: to be reviewed from previous POC Goal status: INITIAL  2.  Patient will complete >/= 15s of condition 2 of MCTSIB for improved balance Baseline: 8s Goal status: REVISED  3.  Pt will improve Berg score to 42/56 for decreased fall risk  Baseline: 38/56 Goal status: REVISED   4.  Pt will improve gait velocity to at least 2.4 ft/s w/LRAD and SBA for improved gait efficiency and safety  Baseline: 2.24 ft/s w/rollator  Goal status: INITIAL   LONG TERM GOALS: Target date: 03/15/2023   Pt will be independent with final HEP for improved strength, balance, transfers and gait.  Baseline:  Goal status: INITIAL  2.  Pt will improve 5 x STS to less than or equal to 13 seconds w/proper form to demonstrate improved functional strength and transfer efficiency.   Baseline: 15.47s w/BUE support and bracing against chair  Goal status: INITIAL  3.  Patient will complete >/=30s on condition 2 of MCTSIB to demonstrate improved balance Baseline: 8s Goal status: REVISED  4.  Pt will improve Berg score to 46/56 for decreased  fall risk  Baseline: 38/56 Goal status: REVISED   5.  Pt will improve gait velocity to at least 2.6 ft/s w/LRAD mod I for improved gait efficiency and indepdence  Baseline:  Goal status: INITIAL   ASSESSMENT:  CLINICAL IMPRESSION: Arrived no charge- given patients extensive history  of embolic CVAs and current work up for DVTs it is in patients best interest to not participate in PT until this is ruled out.    OBJECTIVE IMPAIRMENTS: Abnormal gait, decreased balance, decreased cognition, decreased coordination, decreased knowledge of condition, decreased knowledge of use of DME, decreased mobility, difficulty walking, decreased safety awareness, increased edema, impaired perceived functional ability, impaired UE functional use, and impaired vision/preception  ACTIVITY LIMITATIONS: carrying, lifting, squatting, stairs, locomotion level, and caring for others  PARTICIPATION LIMITATIONS: meal prep, cleaning, laundry, interpersonal relationship, driving, shopping, community activity, and yard work  PERSONAL FACTORS: Behavior pattern, Education, Fitness, Past/current experiences, Transportation, and 1-2 comorbidities: Recurrent strokes and CKD IV   are also affecting patient's functional outcome.   REHAB POTENTIAL: Fair due to lack of support at home, impaired cognition and chronicity of CVA  CLINICAL DECISION MAKING: Evolving/moderate complexity  EVALUATION COMPLEXITY: Moderate  PLAN:  PT FREQUENCY: 2x/week  PT DURATION: 6 weeks (POC written for 8 weeks due to delay in scheduling)   PLANNED INTERVENTIONS: Therapeutic exercises, Therapeutic activity, Neuromuscular re-education, Balance training, Gait training, Patient/Family education, Self Care, Joint mobilization, Stair training, Vestibular training, Canalith repositioning, DME instructions, Aquatic Therapy, Dry Needling, Electrical stimulation, Manual therapy, and Re-evaluation  PLAN FOR NEXT SESSION: Add to HEP for improved narrow BOS, head turns, etc.. LE coordination, practice w/cane in clinic. Scifit for endurance. Check BP, results of [name]?   Westley Foots, PT Westley Foots, PT, DPT, CBIS  03/01/2023, 9:01 AM

## 2023-03-05 ENCOUNTER — Ambulatory Visit: Payer: 59

## 2023-03-05 ENCOUNTER — Other Ambulatory Visit (HOSPITAL_BASED_OUTPATIENT_CLINIC_OR_DEPARTMENT_OTHER): Payer: Self-pay | Admitting: Family

## 2023-03-05 ENCOUNTER — Ambulatory Visit: Payer: 59 | Admitting: Physical Therapy

## 2023-03-05 VITALS — BP 137/82 | HR 69

## 2023-03-05 DIAGNOSIS — R4701 Aphasia: Secondary | ICD-10-CM

## 2023-03-05 DIAGNOSIS — R2689 Other abnormalities of gait and mobility: Secondary | ICD-10-CM

## 2023-03-05 DIAGNOSIS — R2681 Unsteadiness on feet: Secondary | ICD-10-CM

## 2023-03-05 DIAGNOSIS — M6281 Muscle weakness (generalized): Secondary | ICD-10-CM

## 2023-03-05 DIAGNOSIS — I119 Hypertensive heart disease without heart failure: Secondary | ICD-10-CM

## 2023-03-05 DIAGNOSIS — R471 Dysarthria and anarthria: Secondary | ICD-10-CM

## 2023-03-05 DIAGNOSIS — R296 Repeated falls: Secondary | ICD-10-CM

## 2023-03-05 DIAGNOSIS — R278 Other lack of coordination: Secondary | ICD-10-CM

## 2023-03-05 DIAGNOSIS — R41841 Cognitive communication deficit: Secondary | ICD-10-CM

## 2023-03-05 NOTE — Patient Instructions (Signed)
SLOW LOUD OVER-ENNUNCIATE PAUSE  PA TA KA  PATA [place] P[place]  BUTTERCUP  CATERPILLAR  BASEBALLL PLAYER  [place]  [place]  SLOW AND BIG - EXAGGERATE YOUR MOUTH, MAKE EACH CONSONANT

## 2023-03-05 NOTE — Therapy (Signed)
OUTPATIENT SPEECH LANGUAGE PATHOLOGY EVALUATION   Patient Name: Christina Orozco MRN: [id number] DOB:1970-06-14, 52 y.o., female Today's Date: 03/05/2023  PCP: Kerin Salen, PA-C REFERRING PROVIDER: Kerin Salen, PA-C  END OF SESSION:  End of Session - 03/05/23 1601     Visit Number 1    Number of Visits 17    Date for SLP Re-Evaluation 04/30/23    Authorization Type [employer] Dual Complete    Progress Note Due on Visit 10    SLP Start Time 1400    SLP Stop Time  1442    SLP Time Calculation (min) 42 min    Activity Tolerance Patient tolerated treatment well;Other (comment)   lability            Past Medical History:  Diagnosis Date   Anemia    severe   Anxiety    Asthma    CKD (chronic kidney disease)    stage IV   DDD (degenerative disc disease), cervical    Depression    Diabetes mellitus (HCC)    Dislocated shoulder    right   GERD (gastroesophageal reflux disease)    Headache    migraines (about once a month)   History of kidney stones    Hypertension    Neuropathy    Peripheral vascular disease (HCC)    Pneumonia    Stroke [facility])    Past Surgical History:  Procedure Laterality Date   ADENOIDECTOMY     APPENDECTOMY     CERVICAL SPINE SURGERY  06/2012   C5-C6   EYE SURGERY     left eye surgery    FOOT SURGERY     KENALOG INJECTION Left 03/10/2021   Procedure: Intravitreal Avastin Injection;  Surgeon: Stephannie Li, MD;  Location: [facility] OR;  Service: Ophthalmology;  Laterality: Left;   LOOP RECORDER INSERTION N/A 08/27/2017   Procedure: LOOP RECORDER INSERTION;  Surgeon: Thurmon Fair, MD;  Location: [facility] INVASIVE CV LAB;  Service: Cardiovascular;  Laterality: N/A;   MEMBRANE PEEL Left 03/10/2021   Procedure: MEMBRANE PEEL;  Surgeon: Stephannie Li, MD;  Location: [facility] OR;  Service: Ophthalmology;  Laterality: Left;   PARS PLANA VITRECTOMY Left 03/10/2021   Procedure: TWENTY-FIVE GAUGE PARS PLANA VITRECTOMY;  Surgeon: Stephannie Li, MD;  Location: [facility] OR;  Service: Ophthalmology;  Laterality: Left;   PHOTOCOAGULATION WITH LASER Left 03/10/2021   Procedure: PHOTOCOAGULATION WITH LASER;  Surgeon: Stephannie Li, MD;  Location: [facility] OR;  Service: Ophthalmology;  Laterality: Left;   SHOULDER SURGERY     TEE WITHOUT CARDIOVERSION N/A 08/27/2017   Procedure: TRANSESOPHAGEAL ECHOCARDIOGRAM (TEE);  Surgeon: Thurmon Fair, MD;  Location: [facility] ENDOSCOPY;  Service: Cardiovascular;  Laterality: N/A;   TONSILLECTOMY     Patient Active Problem List   Diagnosis Date Noted   Diplopia 10/31/2022   OSA (obstructive sleep apnea) 08/25/2022   Subcortical infarction (HCC) 08/23/2022   Overweight (BMI 25.0-29.9) 08/23/2022   Asthma, chronic 08/22/2022   TIA (transient ischemic attack) 06/20/2022   Numbness and tingling in left hand 03/02/2022   Gait disturbance, post-stroke 03/02/2022   AKI (acute kidney injury) (HCC) 12/23/2021   Acute embolic stroke (HCC) 12/23/2021   Hypernatremia 12/20/2021   Chronic kidney disease (CKD), stage IV (severe) (HCC) 12/19/2021   Left-sided weakness and visual deficits  12/19/2021   Vision abnormalities 12/19/2021   Acute stroke due to ischemia [facility]) 11/10/2021   Depression 07/05/2020   CVA (cerebral vascular accident) (HCC) 06/21/2020   CVA (cerebrovascular accident) (HCC) 06/19/2020  Generalized anxiety disorder 03/01/2019   Abnormality of gait 10/14/2018   Dizziness and giddiness 09/13/2018   Debility 06/26/2018   Chronic nonintractable headache 04/26/2018   Diastolic dysfunction    Congestion of nasal sinus    Pneumonia of left lower lobe due to infectious organism    SOB (shortness of breath)    Candidiasis    Anemia    Hypoalbuminemia due to protein-calorie malnutrition (HCC)    Hypertension    Type 2 diabetes mellitus with chronic kidney disease, without long-term current use of insulin (HCC)    PRES (posterior reversible encephalopathy syndrome)    Hypokalemia    History of CVA  with residual deficit    History of seizure 03/25/2018   Bilateral carpal tunnel syndrome 02/14/2018   Arthritis 10/31/2017   Asthma 10/31/2017   Constipation 10/31/2017   Kidney stone 10/31/2017   Anemia, chronic disease 08/09/2017   Hyperlipidemia 07/26/2017   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HCC) 07/25/2017   Anxiety as acute reaction to exceptional stress 04/23/2017   Insomnia 10/08/2016   Neuropathy 06/07/2016   Gait instability    Lesion of pons    Slurred speech    Vertigo 04/30/2016   Hyperglycemia 04/30/2016   Chronic kidney disease (CKD), stage III (moderate) (HCC) 04/30/2016   Ataxia 04/30/2016   Chronic right shoulder pain 04/03/2016   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   Rhinitis, allergic 08/25/2015   Asthma, mild intermittent 07/14/2015   Gastroesophageal reflux disease without esophagitis 06/08/2015   Hypertensive cardiovascular disease 05/05/2015   Vitamin D deficiency 05/05/2015    ONSET DATE: 02/20/23 (referral date)   REFERRING DIAG: I63.9 (ICD-10-CM) - Recurrent stroke  THERAPY DIAG:  Dysarthria and anarthria  Aphasia  Cognitive communication deficit  Rationale for Evaluation and Treatment: Rehabilitation  SUBJECTIVE:   SUBJECTIVE STATEMENT: "My momma and I have noticed changes in the way I talk"  Pt accompanied by: self  PERTINENT HISTORY:  recurrent stroke (ILR placed 2019 removed 07/2019 after no atrial arrhythmias noted), seizure, vertigo. Scheduled to have cataract surgery in October   PAIN: Are you having pain? No  FALLS: Has patient fallen in last 6 months?  Yes, See PT evaluation for details  LIVING ENVIRONMENT: Lives with: lives alone Lives in: House/apartment  PLOF:  Level of assistance: Needed assistance with ADLs, Needed assistance with IADLS Employment: On disability  PATIENT GOALS: "To talk. I want my mom to stop correcting me (when I talk)"  OBJECTIVE:   COGNITION: Overall cognitive status: Impaired Areas of impairment:   Attention: Impaired: Sustained, Selective Memory: Impaired: Working Teacher, music term Awareness: Impaired: Comment: Inconsistent Executive function: Impaired: Impulse control, Problem solving, Error awareness, Self-correction, and Slow processing Behavior: Poor frustration tolerance and Lability Functional deficits: "If I remember, I remember. If I don't, I don't." Pt endorses memory changes since strokes have occurred, such as forgetting who family or friends are that she has not seen in a long time. Lives by herself and is completing iADLs/ADLs independently.   AUDITORY COMPREHENSION: Overall auditory comprehension: Impaired: moderately complex YES/NO questions: Not tested Following directions: Not tested Conversation: Moderately Complex Interfering components: attention, awareness, visual impairments, processing speed, and working memory Effective technique: extra processing time, pausing, repetition/stressing words, and slowed speech  EXPRESSION: verbal  VERBAL EXPRESSION: Level of generative/spontaneous verbalization: word, phrase, sentence, and conversation Automatic speech: social response: impaired  Naming: Responsive: 76-100% and Confrontation: 76-100% Interfering components: attention and speech intelligibility Effective technique: semantic cues and phonemic cues Non-verbal means of communication: N/A Comments: Exhibited word-finding  difficulties and phonemic paraphasias during word-finding task. Benefited from describing word.   MOTOR SPEECH: Overall motor speech: impaired Level of impairment: Word, Phrase, Sentence, and Conversation Respiration: thoracic breathing, clavicular breathing, and speaking on residual capacity Phonation: normal Resonance: WFL Articulation: Impaired: word, phrase, sentence, and conversation Intelligibility: Intelligibility reduced Motor planning: Appears intact Motor speech errors: aware and inconsistent Interfering components:  emotional  lability Effective technique: slow rate, over articulate, and pacing  ORAL MOTOR EXAMINATION: Overall status: Impaired:   Labial: Right (Symmetry and Coordination) Lingual: Bilateral (Coordination) Facial: Right (Symmetry and Coordination)  STANDARDIZED ASSESSMENTS: [place] NAMING TEST: 14/15 with self-correction x1  PATIENT REPORTED OUTCOME MEASURES (PROM): Communication Participation Item Bank: 15 Very much interference: communicating in small group, persuading family/friend Quite a bit of interference: communicating quickly, participating in fast moving conversation  A little of interference: talking with people you know, communicating in community, asking questions in conversation, having long conversation, giving detailed information    TODAY'S TREATMENT:                                                                                                                                         03-05-23: Eval only    PATIENT EDUCATION: Education details: see above Person educated: Patient Education method: Explanation, Demonstration, and Handouts Education comprehension: verbalized understanding, returned demonstration, and needs further education   GOALS: Goals reviewed with patient? Yes  SHORT TERM GOALS: Target date: 04/02/2023  Pt will utilize dysarthria strategies during structured activity to be 100% intelligible with occasional min A  Baseline: Goal status: INITIAL  2.  Pt will demonstrate increased awareness of speech errors with attempts to self-correct for 50% of opportunities in structured activity  Baseline:  Goal status: INITIAL  3.  Pt will utilize word retrieval strategies during structured conversations x2 with occasional min A  Baseline:  Goal status: INITIAL  4.  Pt will self-advocate for additional processing time as needed for 2/2 opportunities without prompting  Baseline:  Goal status: INITIAL  LONG TERM GOALS: Target date: 04/30/2023  Pt will  utilize dysarthria strategies during unstructured conversation to be 100% intelligible with rare min A  Baseline:  Goal status: INITIAL  2.  Pt will demonstrate increased awareness of speech errors and self-correct for 80% of opportunities in unstructured conversations x2 Baseline:  Goal status: INITIAL  3.  Pt will utilize word retrieval strategies during unstructured conversations x2 with rare min A  Baseline:  Goal status: INITIAL  4.  Pt will report improved communication effectiveness via PROM by 2 points at LTG date Baseline: CPIB=15 Goal status: INITIAL   ASSESSMENT:  CLINICAL IMPRESSION: Patient is a 52 y.o. female who was seen today for speech and cognitive changes s/p recurrent strokes. Presents with word-finding difficulties, phonemic paraphasias, dysarthria c/b imprecise articulation, and reduced intelligibility. States her speech feels like its "dragging." Pt expressed some memory concerns, but otherwise has not  noticed cognitive changes. During the evaluation, the SLP observed reduced processing speed, poor error awareness, and emotional lability. Inconsistent awareness of phonemic paraphasias exhibited. Denies swallowing concerns at this time. Pt will benefit from ST strategies to increase communication effectiveness and functional independence.   OBJECTIVE IMPAIRMENTS: include attention, memory, awareness, aphasia, and dysarthria. These impairments are limiting patient from effectively communicating at home and in community. Factors affecting potential to achieve goals and functional outcome are co-morbidities and medical prognosis. Patient will benefit from skilled SLP services to address above impairments and improve overall function.  REHAB POTENTIAL: Good  PLAN:  SLP FREQUENCY: 1-2x/week  SLP DURATION: 8 weeks  PLANNED INTERVENTIONS: Cueing hierachy, Cognitive reorganization, Internal/external aids, Functional tasks, SLP instruction and feedback, Compensatory  strategies, and Patient/family education    Gracy Racer, CCC-SLP 03/05/2023, 4:18 PM

## 2023-03-05 NOTE — Therapy (Signed)
OUTPATIENT PHYSICAL THERAPY NEURO TREATMENT- ARRIVED NO CHARGE   Patient Name: Christina Orozco MRN: [id number] DOB:[birth year], 52 y.o., female Today's Date: 03/05/2023   PCP: Kerin Salen, PA-C REFERRING PROVIDER: Kerin Salen, PA-C  END OF SESSION:  PT End of Session - 03/05/23 1325     Visit Number 4   Arrived no charge   Number of Visits 13    Date for PT Re-Evaluation 03/29/23    Authorization Type [employer]    PT Start Time 1319    PT Stop Time 1344   No charge   PT Time Calculation (min) 25 min    Activity Tolerance Treatment limited secondary to medical complications (Comment)   Multiple CVAs   Behavior During Therapy WFL for tasks assessed/performed             Past Medical History:  Diagnosis Date   Anemia    severe   Anxiety    Asthma    CKD (chronic kidney disease)    stage IV   DDD (degenerative disc disease), cervical    Depression    Diabetes mellitus (HCC)    Dislocated shoulder    right   GERD (gastroesophageal reflux disease)    Headache    migraines (about once a month)   History of kidney stones    Hypertension    Neuropathy    Peripheral vascular disease (HCC)    Pneumonia    Stroke [facility])    Past Surgical History:  Procedure Laterality Date   ADENOIDECTOMY     APPENDECTOMY     CERVICAL SPINE SURGERY  06/2012   C5-C6   EYE SURGERY     left eye surgery    FOOT SURGERY     KENALOG INJECTION Left 03/10/2021   Procedure: Intravitreal Avastin Injection;  Surgeon: Stephannie Li, MD;  Location: [facility] OR;  Service: Ophthalmology;  Laterality: Left;   LOOP RECORDER INSERTION N/A 08/27/2017   Procedure: LOOP RECORDER INSERTION;  Surgeon: Thurmon Fair, MD;  Location: [facility] INVASIVE CV LAB;  Service: Cardiovascular;  Laterality: N/A;   MEMBRANE PEEL Left 03/10/2021   Procedure: MEMBRANE PEEL;  Surgeon: Stephannie Li, MD;  Location: [facility] OR;  Service: Ophthalmology;  Laterality: Left;   PARS PLANA VITRECTOMY Left 03/10/2021    Procedure: TWENTY-FIVE GAUGE PARS PLANA VITRECTOMY;  Surgeon: Stephannie Li, MD;  Location: [facility] OR;  Service: Ophthalmology;  Laterality: Left;   PHOTOCOAGULATION WITH LASER Left 03/10/2021   Procedure: PHOTOCOAGULATION WITH LASER;  Surgeon: Stephannie Li, MD;  Location: [facility] OR;  Service: Ophthalmology;  Laterality: Left;   SHOULDER SURGERY     TEE WITHOUT CARDIOVERSION N/A 08/27/2017   Procedure: TRANSESOPHAGEAL ECHOCARDIOGRAM (TEE);  Surgeon: Thurmon Fair, MD;  Location: [facility] ENDOSCOPY;  Service: Cardiovascular;  Laterality: N/A;   TONSILLECTOMY     Patient Active Problem List   Diagnosis Date Noted   Diplopia 10/31/2022   OSA (obstructive sleep apnea) 08/25/2022   Subcortical infarction (HCC) 08/23/2022   Overweight (BMI 25.0-29.9) 08/23/2022   Asthma, chronic 08/22/2022   TIA (transient ischemic attack) 06/20/2022   Numbness and tingling in left hand 03/02/2022   Gait disturbance, post-stroke 03/02/2022   AKI (acute kidney injury) (HCC) 12/23/2021   Acute embolic stroke (HCC) 12/23/2021   Hypernatremia 12/20/2021   Chronic kidney disease (CKD), stage IV (severe) (HCC) 12/19/2021   Left-sided weakness and visual deficits  12/19/2021   Vision abnormalities 12/19/2021   Acute stroke due to ischemia [facility]) 11/10/2021   Depression 07/05/2020   CVA (cerebral  vascular accident) (HCC) 06/21/2020   CVA (cerebrovascular accident) (HCC) 06/19/2020   Generalized anxiety disorder 03/01/2019   Abnormality of gait 10/14/2018   Dizziness and giddiness 09/13/2018   Debility 06/26/2018   Chronic nonintractable headache 04/26/2018   Diastolic dysfunction    Congestion of nasal sinus    Pneumonia of left lower lobe due to infectious organism    SOB (shortness of breath)    Candidiasis    Anemia    Hypoalbuminemia due to protein-calorie malnutrition (HCC)    Hypertension    Type 2 diabetes mellitus with chronic kidney disease, without long-term current use of insulin (HCC)    PRES (posterior  reversible encephalopathy syndrome)    Hypokalemia    History of CVA with residual deficit    History of seizure 03/25/2018   Bilateral carpal tunnel syndrome 02/14/2018   Arthritis 10/31/2017   Asthma 10/31/2017   Constipation 10/31/2017   Kidney stone 10/31/2017   Anemia, chronic disease 08/09/2017   Hyperlipidemia 07/26/2017   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HCC) 07/25/2017   Anxiety as acute reaction to exceptional stress 04/23/2017   Insomnia 10/08/2016   Neuropathy 06/07/2016   Gait instability    Lesion of pons    Slurred speech    Vertigo 04/30/2016   Hyperglycemia 04/30/2016   Chronic kidney disease (CKD), stage III (moderate) (HCC) 04/30/2016   Ataxia 04/30/2016   Chronic right shoulder pain 04/03/2016   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   Rhinitis, allergic 08/25/2015   Asthma, mild intermittent 07/14/2015   Gastroesophageal reflux disease without esophagitis 06/08/2015   Hypertensive cardiovascular disease 05/05/2015   Vitamin D deficiency 05/05/2015    ONSET DATE: 01/18/2023  REFERRING DIAG: I63.9 (ICD-10-CM) - Recurrent strokes (HCC)  THERAPY DIAG:  Unsteadiness on feet  Muscle weakness (generalized)  Repeated falls  Other lack of coordination  Other abnormalities of gait and mobility  Rationale for Evaluation and Treatment: Rehabilitation  SUBJECTIVE:                                                                                                                                                                                             SUBJECTIVE STATEMENT: Patient arrives to clinic with rollator. Reports her leg is feeling better but was told she has had 4 strokes since her last MRI in July.   Pt accompanied by: self  PERTINENT HISTORY:  recurrent stroke (ILR placed 2019 removed 07/2019 after no atrial arrhythmias noted), seizure, vertigo. Scheduled to have cataract surgery in October   PAIN:  Are you having pain? No  PRECAUTIONS: Fall  PATIENT  GOALS: "To be able to walk without help (cannot  remember the last time she could do this)"  OBJECTIVE:   DIAGNOSTIC FINDINGS: CT of brain from 10/30/22 FINDINGS: Brain: No acute hemorrhage. Acute infarcts seen on the previous day's MRI brain are not resolved by CT. Unchanged chronic small-vessel disease and old lacunar infarcts in the bilateral basal ganglia and thalami. Cortical gray-white differentiation is otherwise preserved. Prominence of the ventricles and sulci within expected range for age. No hydrocephalus or extra-axial collection. No mass effect or midline shift.  MRI of brain on 10/28/22 IMPRESSION: 1. Positive for punctate acute right temporal lobe white matter lacunar infarct. And two small subacute appearing white matter infarcts in the Left corona radiata, near the left splenium. No associated hemorrhage or mass effect. 2. Underlying chronic severe small vessel disease, including numerous chronic microhemorrhages in the brainstem with Wallerian degeneration. Conspicuous periventricular white matter involvement, although chronic demyelinating disease is felt less likely. 3. MRA today is reported separately.   MRI of brain from 03/01/23  1.  Multiple new acute or early subacute infarcts involving the right basal ganglia and bilateral cerebral white matter without substantial mass effect or space-occupying hematoma.  2.  Redemonstrated background of age advanced white matter disease and sequela of multifocal remote hemorrhages and lacunar infarcts. While this could represent sequela of age-advanced atherosclerosis/arteriosclerosis in the setting of a documented history of multiple vascular risk factors, broader differentials for chronic vasculopathy should be considered including vasculitis.  3.  Similar punctate focus of hyperintensity in the right midbrain on the postcontrast sequence, at least in part related to intrinsic T1 hyperintensity in this region with possible  superimposed punctate enhancement favored to represent a small vascular structure.   VITALS  Vitals:   03/05/23 1342  BP: 137/82  Pulse: 69      TODAY'S TREATMENT:    Arrived no charge Assessed vitals (see above) and WNL  Discussed recent medical changes and plan to have DSA and TEE. Pt in agreement to pause PT until she completes imaging w/neurology and cataract surgery due to concerns w/medical stability. Pt verbalized understanding.   PATIENT EDUCATION: Education details: See above Person educated: Patient Education method: Explanation, Demonstration, and Handouts Education comprehension: verbalized understanding, returned demonstration, verbal cues required, and needs further education  HOME EXERCISE PROGRAM: Access Code: [email or web address] URL: [email or web address] Date: 02/19/2023 Prepared by: Alethia Berthold Wilder Amodei  Exercises - Sit to Stand Without Arm Support  - 1 x daily - 7 x weekly - 3 sets - 10 reps - Standing Hip Abduction with Counter Support  - 1 x daily - 7 x weekly - 3 sets - 10 reps - Standing Hip Extension with Counter Support  - 1 x daily - 7 x weekly - 3 sets - 10 reps - Standing Tandem Balance with Counter Support  - 1 x daily - 7 x weekly - 3 sets - 30s hold - Standing March with Counter Support  - 1 x daily - 7 x weekly - 3 sets - 10 reps - Single Leg Stance with Support  - 1 x daily - 7 x weekly - 3 sets - 30s hold  GOALS: Goals reviewed with patient? Yes  SHORT TERM GOALS: Target date: 03/01/2023    Pt will be independent with initial HEP for improved strength, balance, transfers and gait.  Baseline: to be reviewed from previous POC Goal status: INITIAL  2.  Patient will complete >/= 15s of condition 2 of MCTSIB for improved balance Baseline: 8s Goal status: REVISED  3.  Pt will improve Berg score  to 42/56 for decreased fall risk  Baseline: 38/56 Goal status: REVISED   4.  Pt will improve gait velocity to at least 2.4 ft/s w/LRAD and SBA  for improved gait efficiency and safety  Baseline: 2.24 ft/s w/rollator  Goal status: INITIAL   LONG TERM GOALS: Target date: 03/15/2023   Pt will be independent with final HEP for improved strength, balance, transfers and gait.  Baseline:  Goal status: INITIAL  2.  Pt will improve 5 x STS to less than or equal to 13 seconds w/proper form to demonstrate improved functional strength and transfer efficiency.   Baseline: 15.47s w/BUE support and bracing against chair  Goal status: INITIAL  3.  Patient will complete >/=30s on condition 2 of MCTSIB to demonstrate improved balance Baseline: 8s Goal status: REVISED  4.  Pt will improve Berg score to 46/56 for decreased fall risk  Baseline: 38/56 Goal status: REVISED   5.  Pt will improve gait velocity to at least 2.6 ft/s w/LRAD mod I for improved gait efficiency and indepdence  Baseline:  Goal status: INITIAL   ASSESSMENT:  CLINICAL IMPRESSION: Arrived no charge- pt has had 4 more strokes since last MRI in late July and has cataract surgery as well as multiple tests to be completed by neurology to determine cause of strokes. Due to medical instability, PT not safe to perform at this time.    OBJECTIVE IMPAIRMENTS: Abnormal gait, decreased balance, decreased cognition, decreased coordination, decreased knowledge of condition, decreased knowledge of use of DME, decreased mobility, difficulty walking, decreased safety awareness, increased edema, impaired perceived functional ability, impaired UE functional use, and impaired vision/preception  ACTIVITY LIMITATIONS: carrying, lifting, squatting, stairs, locomotion level, and caring for others  PARTICIPATION LIMITATIONS: meal prep, cleaning, laundry, interpersonal relationship, driving, shopping, community activity, and yard work  PERSONAL FACTORS: Behavior pattern, Education, Fitness, Past/current experiences, Transportation, and 1-2 comorbidities: Recurrent strokes and CKD IV   are  also affecting patient's functional outcome.   REHAB POTENTIAL: Fair due to lack of support at home, impaired cognition and chronicity of CVA  CLINICAL DECISION MAKING: Evolving/moderate complexity  EVALUATION COMPLEXITY: Moderate  PLAN:  PT FREQUENCY: 2x/week  PT DURATION: 6 weeks (POC written for 8 weeks due to delay in scheduling)   PLANNED INTERVENTIONS: Therapeutic exercises, Therapeutic activity, Neuromuscular re-education, Balance training, Gait training, Patient/Family education, Self Care, Joint mobilization, Stair training, Vestibular training, Canalith repositioning, DME instructions, Aquatic Therapy, Dry Needling, Electrical stimulation, Manual therapy, and Re-evaluation  PLAN FOR NEXT SESSION: Add to HEP for improved narrow BOS, head turns, etc.. LE coordination, practice w/cane in clinic. Scifit for endurance. Check BP   Quynn Vilchis E Junie Avilla, PT  03/05/2023, 1:49 PM

## 2023-03-07 ENCOUNTER — Ambulatory Visit: Payer: 59 | Admitting: Physical Therapy

## 2023-03-08 ENCOUNTER — Encounter (HOSPITAL_BASED_OUTPATIENT_CLINIC_OR_DEPARTMENT_OTHER): Payer: 59 | Admitting: Psychology

## 2023-03-08 DIAGNOSIS — F482 Pseudobulbar affect: Secondary | ICD-10-CM

## 2023-03-08 DIAGNOSIS — R531 Weakness: Secondary | ICD-10-CM

## 2023-03-08 DIAGNOSIS — R202 Paresthesia of skin: Secondary | ICD-10-CM

## 2023-03-08 DIAGNOSIS — I69398 Other sequelae of cerebral infarction: Secondary | ICD-10-CM | POA: Diagnosis not present

## 2023-03-08 DIAGNOSIS — R269 Unspecified abnormalities of gait and mobility: Secondary | ICD-10-CM

## 2023-03-08 DIAGNOSIS — R200 Anesthesia of skin: Secondary | ICD-10-CM

## 2023-03-08 DIAGNOSIS — I639 Cerebral infarction, unspecified: Secondary | ICD-10-CM

## 2023-03-11 NOTE — Progress Notes (Signed)
Neuropsychological Consultation   Patient:   Christina Orozco   DOB:   03-19-1971  MR Number:  [id number]  Location:  [facility]ICINE & REHABILITATION [address] [place], [address] [place] [address] Dept: [phone]           Date of Service:   10/19/2022  Location of Service and Individuals present: Today's visit was conducted in my outpatient clinic office with the patient and myself present.  Start Time:   3 PM End Time:   4 PM  Today's visit was 2 hours in total duration.  1 hour and 15 minutes was spent with clinical interview and the other 45 minutes was spent with record review and report writing.  Patient Consent and Confidentiality: Patient consented for clinical visit and discussed limits of confidentiality particularly with electronic medical records and information being supplied back to her referring physician Claudette Laws, MD and being made available in the patient's electronic medical records.  Consent for Evaluation and Treatment:  Signed:  Yes Explanation of Privacy Policies:  Signed:  Yes Discussion of Confidentiality Limits:  Yes  Provider/Observer:  Arley Phenix, Psy.D.       Clinical Neuropsychologist       Billing Code/Service: [phone]  Chief Complaint:     Chief Complaint  Patient presents with   Cerebrovascular Accident   Gait Problem   Memory Loss   Sleeping Problem   Stress    Reason for Service:    Christina Orozco is a 52 year old female referred for neuropsychological/psychological therapeutic interventions with a history of multiple previous cerebrovascular accidents.  The patient was recently seen in the inpatient comprehensive rehabilitation program and I saw the patient while she was admitted on 08/30/2022.  Patient has a past medical history that includes significant hypertension, hyperlipidemia, controlled type 2 diabetes and prior history of multiple  strokes/TIA.  Patient also has a history of depression and has taken Cymbalta in the past with no current psychotropic medications.  Patient has had previous diagnoses including obstructive sleep apnea and was on CPAP prior.  Patient had a more recent sleep study done that was required to get a new CPAP machine and at that point the sleep study was not definitive.  The sleep apnea machine was not reviewed/renewed although I think in this particular case we should push to restarting that.  The most recent CVA occurred on 08/22/2022 resulting in left-sided weakness, dizziness and aphasia.  MRI brain at the time identified punctuate foci of acute/early subacute ischemia in the lateral margins of the left basal ganglia.  During the clinical interview today, the patient reports of her 10 or more cerebrovascular events that she has suffered.  The patient reports that with her most recent stroke and continuing after her discharge from CIR the patient would cry at the drop of a hat and that her crying and emotional shifts were much stronger than it had ever been in her life.  Patient reports that some of this is improved but she continues to have much more reactive emotional response.  The patient reports that her negative emotional responses are very strong and different than her usual past emotional responses.  Patient reports that when she first got out of the hospital that she was scared to go to sleep for fear of having another stroke event with this fear has improved somewhat.  The patient was afraid that she would not wake up in the morning and  die in her sleep.  The patient has started using a CPAP machine again.  Patient also has a history of seizure in 2019.  Patient, with a lot of effort, has improved her metabolic status related to her diabetes and her A1c has reduced significantly and is now in an appropriate range.  Patient reports that her first stroke was in 2016 after she stopped using her CPAP machine.   Patient reports that she still has some vision loss and has stopped driving and continues to experience motor deficits and speech fluency changes.  Patient has had multiple hospitalizations and rehabilitative efforts and continues with left-sided weakness.   Note: Post our clinical interview on 10/19/2022 the patient had another cerebrovascular accident on 10/27/2022.  During that visit she admitted to missing some of her medications occasionally and she was admitted for acute CVA.  On this occasion she had a right temporal lobe white matter lacunar infarct in the left corona radiata.  Medical History:   Past Medical History:  Diagnosis Date   Anemia    severe   Anxiety    Asthma    CKD (chronic kidney disease)    stage IV   DDD (degenerative disc disease), cervical    Depression    Diabetes mellitus (HCC)    Dislocated shoulder    right   GERD (gastroesophageal reflux disease)    Headache    migraines (about once a month)   History of kidney stones    Hypertension    Neuropathy    Peripheral vascular disease (HCC)    Pneumonia    Stroke [facility])          Patient Active Problem List   Diagnosis Date Noted   Diplopia 10/31/2022   OSA (obstructive sleep apnea) 08/25/2022   Subcortical infarction (HCC) 08/23/2022   Overweight (BMI 25.0-29.9) 08/23/2022   Asthma, chronic 08/22/2022   TIA (transient ischemic attack) 06/20/2022   Numbness and tingling in left hand 03/02/2022   Gait disturbance, post-stroke 03/02/2022   AKI (acute kidney injury) (HCC) 12/23/2021   Acute embolic stroke (HCC) 12/23/2021   Hypernatremia 12/20/2021   Chronic kidney disease (CKD), stage IV (severe) (HCC) 12/19/2021   Left-sided weakness and visual deficits  12/19/2021   Vision abnormalities 12/19/2021   Acute stroke due to ischemia (HCC) 11/10/2021   Depression 07/05/2020   CVA (cerebral vascular accident) (HCC) 06/21/2020   CVA (cerebrovascular accident) (HCC) 06/19/2020   Generalized anxiety  disorder 03/01/2019   Abnormality of gait 10/14/2018   Dizziness and giddiness 09/13/2018   Debility 06/26/2018   Chronic nonintractable headache 04/26/2018   Diastolic dysfunction    Congestion of nasal sinus    Pneumonia of left lower lobe due to infectious organism    SOB (shortness of breath)    Candidiasis    Anemia    Hypoalbuminemia due to protein-calorie malnutrition (HCC)    Hypertension    Type 2 diabetes mellitus with chronic kidney disease, without long-term current use of insulin (HCC)    PRES (posterior reversible encephalopathy syndrome)    Hypokalemia    History of CVA with residual deficit    History of seizure 03/25/2018   Bilateral carpal tunnel syndrome 02/14/2018   Arthritis 10/31/2017   Asthma 10/31/2017   Constipation 10/31/2017   Kidney stone 10/31/2017   Anemia, chronic disease 08/09/2017   Hyperlipidemia 07/26/2017   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HCC) 07/25/2017   Anxiety as acute reaction to exceptional stress 04/23/2017   Insomnia 10/08/2016   Neuropathy 06/07/2016  Gait instability    Lesion of pons    Slurred speech    Vertigo 04/30/2016   Hyperglycemia 04/30/2016   Chronic kidney disease (CKD), stage III (moderate) (HCC) 04/30/2016   Ataxia 04/30/2016   Chronic right shoulder pain 04/03/2016   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  Most recent MRI performed on 10/28/2022 indicated chronic severe signal abnormality in the brainstem and cerebellar peduncles, multifocal chronic bilateral deep gray nuclei lacunar infarcts.  An extensive cerebral white matter T2 and FLAIR hyperintensities including some areas of corpus callosum.  Encephalomalacia no  and lesions oriented perpendicular to lateral ventricles are also noted.  A moderate number of chronic microhemorrhages are scattered in the bilateral deep gray nuclei especially throughout the brainstem.  Behavioral Observation/Mental Status:   Christina Orozco  presents as a 52 y.o.-year-old Right handed African American Female who appeared her stated age. her dress was Appropriate and she was Well Groomed and her manners were Appropriate to the situation.  her participation was indicative of Appropriate behaviors.  There were physical disabilities noted.  she displayed an appropriate level of cooperation and motivation.    Interactions:    Active Appropriate  Attention:   abnormal and attention span appeared shorter than expected for age  Memory:   within normal limits; recent and remote memory intact  Visuo-spatial:   not examined  Speech (Volume):  normal  Speech:   normal; slurred  Thought Process:  Coherent and Relevant  Coherent and Logical  Though Content:  WNL; not suicidal and not homicidal  Orientation:   person, place, time/date, and situation  Judgment:   Fair  Planning:   Fair  Affect:    Anxious  Mood:    Anxious  Insight:   Good  Intelligence:   normal  Marital Status/Living:  The patient was born and raised in [place] along with 1 sister and 2 brothers.  The patient continues to live with her youngest daughter.  Patient is single and is never married.  The patient has 3 adult children age 66, 51.  Educational and Occupational History:     Highest Level of Education:   Patient graduated from high school and has taken some college courses at [employer].  Her best subject was math and had some relative weaknesses with regard to reading.  She did repeat the second grade.  Current Occupation:    The patient is disabled due to her cerebrovascular events.  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  Psychiatric History:  The patient has a history of anxiety and acute reaction to significant psychosocial stressors in the past and history of some mild depression but her emotional status has become quite variable more recently with patterns consistent with pseudobulbar affect type symptoms.  History of Substance Use or Abuse:  No concerns of substance abuse are reported.    Family Med/Psych History:  Family History  Problem Relation Age of Onset   Vascular Disease Mother    CAD Mother    Heart failure Mother    Heart disease Other    Cancer Other        colon, stomach, pancreatic, lung  Diabetes Other    Breast cancer Sister 30   Seizures Maternal Uncle     Impression/DX:   Christina Orozco is a 52 year old female referred for neuropsychological/psychological therapeutic interventions with a history of multiple previous cerebrovascular accidents.  The patient was recently seen in the inpatient comprehensive rehabilitation program and I saw the patient while she was admitted on 08/30/2022.  Patient has a past medical history that includes significant hypertension, hyperlipidemia, controlled type 2 diabetes and prior history of multiple strokes/TIA.  Patient also has a history of depression and has taken Cymbalta in the past with no current psychotropic medications.  Patient has had previous diagnoses including obstructive sleep apnea and was on CPAP prior.  Patient had a more recent sleep study done that was required to get a new CPAP machine and at that point the sleep study was not definitive.  The sleep apnea machine was not reviewed/renewed although I think in this particular case we should push to restarting that.  The most recent CVA occurred on 08/22/2022 resulting in left-sided weakness, dizziness and aphasia.  MRI brain at the time identified punctuate foci of acute/early subacute ischemia in the lateral margins of the left basal ganglia.  During the clinical interview  today, the patient reports of her 10 or more cerebrovascular events that she has suffered.  The patient reports that with her most recent stroke and continuing after her discharge from CIR the patient would cry at the drop of a hat and that her crying and emotional shifts were much stronger than it had ever been in her life.  Patient reports that some of this is improved but she continues to have much more reactive emotional response.  The patient reports that her negative emotional responses are very strong and different than her usual past emotional responses.  Patient reports that when she first got out of the hospital that she was scared to go to sleep for fear of having another stroke event with this fear has improved somewhat.  The patient was afraid that she would not wake up in the morning and die in her sleep.  The patient has started using a CPAP machine again.  Patient also has a history of seizure in 2019.  Patient, with a lot of effort, has improved her metabolic status related to her diabetes and her A1c has reduced significantly and is now in an appropriate range.  Patient reports that her first stroke was in 2016 after she stopped using her CPAP machine.  Patient reports that she still has some vision loss and has stopped driving and continues to experience motor deficits and speech fluency changes.  Patient has had multiple hospitalizations and rehabilitative efforts and continues with left-sided weakness.  Disposition/Plan:  We have set the patient up for therapeutic interventions to try to work on coping and adjustment with multiple prior cerebrovascular events and symptoms consistent with pseudobulbar affect.  I will coordinate with her treating physiatrist around strategies for addressing these mood disturbance type symptoms.  Diagnosis:    PBA (pseudobulbar affect)  Numbness and tingling in left hand  Gait disturbance, post-stroke  Left-sided weakness and visual deficits          Note: This document was prepared using Dragon voice recognition software and Orozco include unintentional dictation errors.   Electronically Signed   _______________________ Arley Phenix, Psy.D. Clinical Neuropsychologist

## 2023-03-12 ENCOUNTER — Ambulatory Visit: Payer: 59

## 2023-03-12 ENCOUNTER — Encounter: Payer: Self-pay | Admitting: Psychology

## 2023-03-12 DIAGNOSIS — R41841 Cognitive communication deficit: Secondary | ICD-10-CM

## 2023-03-12 DIAGNOSIS — R4701 Aphasia: Secondary | ICD-10-CM

## 2023-03-12 DIAGNOSIS — R2689 Other abnormalities of gait and mobility: Secondary | ICD-10-CM | POA: Diagnosis not present

## 2023-03-12 DIAGNOSIS — R471 Dysarthria and anarthria: Secondary | ICD-10-CM

## 2023-03-12 NOTE — Progress Notes (Signed)
Neuropsychological Consultation   Patient:   Christina Orozco   DOB:   April 09, 1971  MR Number:  [id number]  Location:  [facility] [address] [place], [address] [place] [address] Dept: [phone]           Date of Service:   10/19/2022  Location of Service and Individuals present: Today's visit was conducted in my outpatient clinic office with the patient and myself present.  Start Time:   3 PM End Time:   4 PM  Today's visit was 2 hours in total duration.  1 hour and 15 minutes was spent with clinical interview and the other 45 minutes was spent with record review and report writing.  Patient Consent and Confidentiality: Patient consented for clinical visit and discussed limits of confidentiality particularly with electronic medical records and information being supplied back to her referring physician Claudette Laws, MD and being made available in the patient's electronic medical records.  Consent for Evaluation and Treatment:  Signed:  Yes Explanation of Privacy Policies:  Signed:  Yes Discussion of Confidentiality Limits:  Yes  Provider/Observer:  Arley Phenix, Psy.D.       Clinical Neuropsychologist       Billing Code/Service: [phone]  Chief Complaint:     Chief Complaint  Patient presents with   Gait Problem   Cerebrovascular Accident   Memory Loss   Sleeping Problem   Stress    Reason for Service:    Christina Orozco is a 52 year old female referred for neuropsychological/psychological therapeutic interventions with a history of multiple previous cerebrovascular accidents.  The patient was recently seen in the inpatient comprehensive rehabilitation program and I saw the patient while she was admitted on 08/30/2022.  Patient has a past medical history that includes significant hypertension, hyperlipidemia, controlled type 2 diabetes and prior history of multiple  strokes/TIA.  Patient also has a history of depression and has taken Cymbalta in the past with no current psychotropic medications.  Patient has had previous diagnoses including obstructive sleep apnea and was on CPAP prior.  Patient had a more recent sleep study done that was required to get a new CPAP machine and at that point the sleep study was not definitive.  The sleep apnea machine was not reviewed/renewed although I think in this particular case we should push to restarting that.  The most recent CVA occurred on 08/22/2022 resulting in left-sided weakness, dizziness and aphasia.  MRI brain at the time identified punctuate foci of acute/early subacute ischemia in the lateral margins of the left basal ganglia.  During the clinical interview today, the patient reports of her 10 or more cerebrovascular events that she has suffered.  The patient reports that with her most recent stroke and continuing after her discharge from CIR the patient would cry at the drop of a hat and that her crying and emotional shifts were much stronger than it had ever been in her life.  Patient reports that some of this is improved but she continues to have much more reactive emotional response.  The patient reports that her negative emotional responses are very strong and different than her usual past emotional responses.  Patient reports that when she first got out of the hospital that she was scared to go to sleep for fear of having another stroke event with this fear has improved somewhat.  The patient was afraid that she would not wake up in the morning and  die in her sleep.  The patient has started using a CPAP machine again.  Patient also has a history of seizure in 2019.  Patient, with a lot of effort, has improved her metabolic status related to her diabetes and her A1c has reduced significantly and is now in an appropriate range.  Patient reports that her first stroke was in 2016 after she stopped using her CPAP machine.   Patient reports that she still has some vision loss and has stopped driving and continues to experience motor deficits and speech fluency changes.  Patient has had multiple hospitalizations and rehabilitative efforts and continues with left-sided weakness.  03/08/2023 3 PM-4 PM: The patient returns today reporting that her mood has been much improved since our last visit.  The patient reports that her daughter has moved out to go back to college which is lowered the stress in the house considerably.  The patient reports that she did have another stroke event that she was not even aware of.  She had gone in for a repeat MRI and that identified more small strokes.  The patient is going in for TEE study to try to identify triggers for her strokes.  The patient has now had stroke events in May, July and now September.  The patient reports that she continues to have a great deal of fear to fall asleep without her CPAP machine and remains quite diligent.  We continue to work on therapeutic interventions around coping and adjustment with repeat strokes.  Medical History:   Past Medical History:  Diagnosis Date   Anemia    severe   Anxiety    Asthma    CKD (chronic kidney disease)    stage IV   DDD (degenerative disc disease), cervical    Depression    Diabetes mellitus (HCC)    Dislocated shoulder    right   GERD (gastroesophageal reflux disease)    Headache    migraines (about once a month)   History of kidney stones    Hypertension    Neuropathy    Peripheral vascular disease (HCC)    Pneumonia    Stroke [facility] )          Patient Active Problem List   Diagnosis Date Noted   Diplopia 10/31/2022   OSA (obstructive sleep apnea) 08/25/2022   Subcortical infarction (HCC) 08/23/2022   Overweight (BMI 25.0-29.9) 08/23/2022   Asthma, chronic 08/22/2022   TIA (transient ischemic attack) 06/20/2022   Numbness and tingling in left hand 03/02/2022   Gait disturbance, post-stroke 03/02/2022   AKI  (acute kidney injury) (HCC) 12/23/2021   Acute embolic stroke (HCC) 12/23/2021   Hypernatremia 12/20/2021   Chronic kidney disease (CKD), stage IV (severe) (HCC) 12/19/2021   Left-sided weakness and visual deficits  12/19/2021   Vision abnormalities 12/19/2021   Acute stroke due to ischemia (HCC) 11/10/2021   Depression 07/05/2020   CVA (cerebral vascular accident) (HCC) 06/21/2020   CVA (cerebrovascular accident) (HCC) 06/19/2020   Generalized anxiety disorder 03/01/2019   Abnormality of gait 10/14/2018   Dizziness and giddiness 09/13/2018   Debility 06/26/2018   Chronic nonintractable headache 04/26/2018   Diastolic dysfunction    Congestion of nasal sinus    Pneumonia of left lower lobe due to infectious organism    SOB (shortness of breath)    Candidiasis    Anemia    Hypoalbuminemia due to protein-calorie malnutrition (HCC)    Hypertension    Type 2 diabetes mellitus with chronic kidney disease,  without long-term current use of insulin (HCC)    PRES (posterior reversible encephalopathy syndrome)    Hypokalemia    History of CVA with residual deficit    History of seizure 03/25/2018   Bilateral carpal tunnel syndrome 02/14/2018   Arthritis 10/31/2017   Asthma 10/31/2017   Constipation 10/31/2017   Kidney stone 10/31/2017   Anemia, chronic disease 08/09/2017   Hyperlipidemia 07/26/2017   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HCC) 07/25/2017   Anxiety as acute reaction to exceptional stress 04/23/2017   Insomnia 10/08/2016   Neuropathy 06/07/2016   Gait instability    Lesion of pons    Slurred speech    Vertigo 04/30/2016   Hyperglycemia 04/30/2016   Chronic kidney disease (CKD), stage III (moderate) (HCC) 04/30/2016   Ataxia 04/30/2016   Chronic right shoulder pain 04/03/2016   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  Most recent MRI performed on 10/28/2022 indicated chronic severe signal abnormality in the brainstem and cerebellar peduncles, multifocal chronic bilateral deep gray nuclei lacunar infarcts.  An extensive cerebral white matter T2 and FLAIR hyperintensities including some areas of corpus callosum.  Encephalomalacia no and lesions oriented perpendicular to lateral ventricles are also noted.  A moderate number of chronic microhemorrhages are scattered in the bilateral deep gray nuclei especially throughout the brainstem.  Behavioral Observation/Mental Status:   Christina Orozco  presents as a 52 y.o.-year-old Right handed African American Female who appeared her stated age. her dress was Appropriate and she was Well Groomed and her manners were Appropriate to the situation.  her participation was indicative of Appropriate behaviors.  There were physical disabilities noted.  she displayed an appropriate level of cooperation and motivation.    Interactions:    Active Appropriate  Attention:   abnormal and attention span appeared shorter than expected for age  Memory:   within normal limits; recent and remote memory intact  Visuo-spatial:   not examined  Speech (Volume):  normal  Speech:   normal; slurred  Thought Process:  Coherent and Relevant  Coherent and Logical  Though Content:  WNL; not suicidal and not homicidal  Orientation:   person, place, time/date, and situation  Judgment:   Fair  Planning:   Fair  Affect:    Anxious  Mood:    Anxious  Insight:   Good  Intelligence:   normal  Marital Status/Living:  The patient was born and raised in [place] along with 1 sister and 2 brothers.  The patient continues to live with her youngest daughter.  Patient is single and is never married.  The patient has 3 adult  children age 36, 41.  Educational and Occupational History:     Highest Level of Education:   Patient graduated from high school and has taken some college courses at [employer].  Her best subject was math and had some relative weaknesses with regard to reading.  She did repeat the second grade.  Current Occupation:    The patient is disabled due to her cerebrovascular events.  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  Psychiatric History:  The patient has a history of anxiety and acute reaction to significant psychosocial stressors in  the past and history of some mild depression but her emotional status has become quite variable more recently with patterns consistent with pseudobulbar affect type symptoms.  History of Substance Use or Abuse:  No concerns of substance abuse are reported.    Family Med/Psych History:  Family History  Problem Relation Age of Onset   Vascular Disease Mother    CAD Mother    Heart failure Mother    Heart disease Other    Cancer Other        colon, stomach, pancreatic, lung   Diabetes Other    Breast cancer Sister 30   Seizures Maternal Uncle     Impression/DX:   Christina Orozco is a 52 year old female referred for neuropsychological/psychological therapeutic interventions with a history of multiple previous cerebrovascular accidents.  The patient was recently seen in the inpatient comprehensive rehabilitation program and I saw the patient while she was admitted on 08/30/2022.  Patient has a past medical history that includes significant hypertension, hyperlipidemia, controlled type 2 diabetes and prior history of multiple strokes/TIA.  Patient also has a history of depression and has taken Cymbalta in the past with no current psychotropic medications.  Patient has had previous diagnoses including obstructive sleep apnea and was on CPAP prior.  Patient had a more recent sleep study done that was required to  get a new CPAP machine and at that point the sleep study was not definitive.  The sleep apnea machine was not reviewed/renewed although I think in this particular case we should push to restarting that.  The most recent CVA occurred on 08/22/2022 resulting in left-sided weakness, dizziness and aphasia.  MRI brain at the time identified punctuate foci of acute/early subacute ischemia in the lateral margins of the left basal ganglia.  During the clinical interview today, the patient reports of her 10 or more cerebrovascular events that she has suffered.  The patient reports that with her most recent stroke and continuing after her discharge from CIR the patient would cry at the drop of a hat and that her crying and emotional shifts were much stronger than it had ever been in her life.  Patient reports that some of this is improved but she continues to have much more reactive emotional response.  The patient reports that her negative emotional responses are very strong and different than her usual past emotional responses.  Patient reports that when she first got out of the hospital that she was scared to go to sleep for fear of having another stroke event with this fear has improved somewhat.  The patient was afraid that she would not wake up in the morning and die in her sleep.  The patient has started using a CPAP machine again.  Patient also has a history of seizure in 2019.  Patient, with a lot of effort, has improved her metabolic status related to her diabetes and her A1c has reduced significantly and is now in an appropriate range.  Patient reports that her first stroke was in 2016 after she stopped using her CPAP machine.  Patient reports that she still has some vision loss and has stopped driving and continues to experience motor deficits and speech fluency changes.  Patient has had multiple hospitalizations and rehabilitative efforts and continues with left-sided weakness.  03/08/2023 3 PM-4 PM: The  patient returns today reporting that her mood has been much improved since our last visit.  The patient reports that her daughter has moved out to go back to college  which is lowered the stress in the house considerably.  The patient reports that she did have another stroke event that she was not even aware of.  She had gone in for a repeat MRI and that identified more small strokes.  The patient is going in for TEE study to try to identify triggers for her strokes.  The patient has now had stroke events in May, July and now September.  The patient reports that she continues to have a great deal of fear to fall asleep without her CPAP machine and remains quite diligent.  We continue to work on therapeutic interventions around coping and adjustment with repeat strokes.  Disposition/Plan:  We have set the patient up for therapeutic interventions to try to work on coping and adjustment with multiple prior cerebrovascular events and symptoms consistent with pseudobulbar affect.  We have now had 2 therapeutic interventions with the patient showing significant improvement in mood state with reduced stress.  Diagnosis:    PBA (pseudobulbar affect)  Numbness and tingling in left hand  Gait disturbance, post-stroke  Left-sided weakness and visual deficits   Subcortical infarction [facility])        Note: This document was prepared using Dragon voice recognition software and may include unintentional dictation errors.   Electronically Signed   _______________________ Arley Phenix, Psy.D. Clinical Neuropsychologist

## 2023-03-12 NOTE — Patient Instructions (Addendum)
Take your time!  Gather your thoughts before you speak Slow down and over-articulate

## 2023-03-12 NOTE — Therapy (Signed)
OUTPATIENT SPEECH LANGUAGE PATHOLOGY TREATMENT   Patient Name: Christina Orozco MRN: [id number] DOB:12/13/1970, 52 y.o., female Today's Date: 03/12/2023  PCP: Kerin Salen, PA-C REFERRING PROVIDER: Kerin Salen, PA-C  END OF SESSION:  End of Session - 03/12/23 1321     Visit Number 2    Number of Visits 17    Date for SLP Re-Evaluation 04/30/23    Authorization Type [employer] Dual Complete    SLP Start Time 1322    SLP Stop Time  1400    SLP Time Calculation (min) 38 min    Activity Tolerance Patient tolerated treatment well              Past Medical History:  Diagnosis Date   Anemia    severe   Anxiety    Asthma    CKD (chronic kidney disease)    stage IV   DDD (degenerative disc disease), cervical    Depression    Diabetes mellitus (HCC)    Dislocated shoulder    right   GERD (gastroesophageal reflux disease)    Headache    migraines (about once a month)   History of kidney stones    Hypertension    Neuropathy    Peripheral vascular disease (HCC)    Pneumonia    Stroke [facility])    Past Surgical History:  Procedure Laterality Date   ADENOIDECTOMY     APPENDECTOMY     CERVICAL SPINE SURGERY  06/2012   C5-C6   EYE SURGERY     left eye surgery    FOOT SURGERY     KENALOG INJECTION Left 03/10/2021   Procedure: Intravitreal Avastin Injection;  Surgeon: Stephannie Li, MD;  Location: [facility] OR;  Service: Ophthalmology;  Laterality: Left;   LOOP RECORDER INSERTION N/A 08/27/2017   Procedure: LOOP RECORDER INSERTION;  Surgeon: Thurmon Fair, MD;  Location: [facility] INVASIVE [facility];  Service: Cardiovascular;  Laterality: N/A;   MEMBRANE PEEL Left 03/10/2021   Procedure: MEMBRANE PEEL;  Surgeon: Stephannie Li, MD;  Location: [facility] OR;  Service: Ophthalmology;  Laterality: Left;   PARS PLANA VITRECTOMY Left 03/10/2021   Procedure: TWENTY-FIVE GAUGE PARS PLANA VITRECTOMY;  Surgeon: Stephannie Li, MD;  Location: [facility] OR;  Service: Ophthalmology;   Laterality: Left;   PHOTOCOAGULATION WITH LASER Left 03/10/2021   Procedure: PHOTOCOAGULATION WITH LASER;  Surgeon: Stephannie Li, MD;  Location: [facility] OR;  Service: Ophthalmology;  Laterality: Left;   SHOULDER SURGERY     TEE WITHOUT CARDIOVERSION N/A 08/27/2017   Procedure: TRANSESOPHAGEAL ECHOCARDIOGRAM (TEE);  Surgeon: Thurmon Fair, MD;  Location: [facility] ENDOSCOPY;  Service: Cardiovascular;  Laterality: N/A;   TONSILLECTOMY     Patient Active Problem List   Diagnosis Date Noted   Diplopia 10/31/2022   OSA (obstructive sleep apnea) 08/25/2022   Subcortical infarction (HCC) 08/23/2022   Overweight (BMI 25.0-29.9) 08/23/2022   Asthma, chronic 08/22/2022   TIA (transient ischemic attack) 06/20/2022   Numbness and tingling in left hand 03/02/2022   Gait disturbance, post-stroke 03/02/2022   AKI (acute kidney injury) (HCC) 12/23/2021   Acute embolic stroke (HCC) 12/23/2021   Hypernatremia 12/20/2021   Chronic kidney disease (CKD), stage IV (severe) (HCC) 12/19/2021   Left-sided weakness and visual deficits  12/19/2021   Vision abnormalities 12/19/2021   Acute stroke due to ischemia [facility]) 11/10/2021   Depression 07/05/2020   CVA (cerebral vascular accident) (HCC) 06/21/2020   CVA (cerebrovascular accident) (HCC) 06/19/2020   Generalized anxiety disorder 03/01/2019   Abnormality of gait 10/14/2018  Dizziness and giddiness 09/13/2018   Debility 06/26/2018   Chronic nonintractable headache 04/26/2018   Diastolic dysfunction    Congestion of nasal sinus    Pneumonia of left lower lobe due to infectious organism    SOB (shortness of breath)    Candidiasis    Anemia    Hypoalbuminemia due to protein-calorie malnutrition (HCC)    Hypertension    Type 2 diabetes mellitus with chronic kidney disease, without long-term current use of insulin (HCC)    PRES (posterior reversible encephalopathy syndrome)    Hypokalemia    History of CVA with residual deficit    History of seizure 03/25/2018    Bilateral carpal tunnel syndrome 02/14/2018   Arthritis 10/31/2017   Asthma 10/31/2017   Constipation 10/31/2017   Kidney stone 10/31/2017   Anemia, chronic disease 08/09/2017   Hyperlipidemia 07/26/2017   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HCC) 07/25/2017   Anxiety as acute reaction to exceptional stress 04/23/2017   Insomnia 10/08/2016   Neuropathy 06/07/2016   Gait instability    Lesion of pons    Slurred speech    Vertigo 04/30/2016   Hyperglycemia 04/30/2016   Chronic kidney disease (CKD), stage III (moderate) (HCC) 04/30/2016   Ataxia 04/30/2016   Chronic right shoulder pain 04/03/2016   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 Other people do" Pt accompanied by: self  PERTINENT HISTORY:  recurrent stroke (ILR placed 2019 removed 07/2019 after no atrial arrhythmias noted), seizure, vertigo. Scheduled to have cataract surgery in October   PAIN: Are you having pain? No  FALLS: Has patient fallen in last 6 months?  Yes, See PT evaluation for details  LIVING ENVIRONMENT: Lives with: lives alone Lives in: House/apartment  PLOF:  Level of assistance: Needed assistance with ADLs, Needed assistance with IADLS Employment: On disability  PATIENT GOALS: "To talk. I want my mom to stop correcting me (when I talk)"  OBJECTIVE:   TODAY'S TREATMENT:                                                                                                                                          03-12-23: SLP educated pt on dysarthria strategies to increase intelligibility. Targeted in structured naming and description activity. When utilizing strategies, pt increased to 95% intelligibility given occasional mod A. Benefited from cues to think of what she was going to say first, slow down, and over-enunciate. Experienced one instance of imprecise articulation which led  to increased frustration. Pt successful with word once given visual cue and SLP model of breaking word into syllables. Practiced dysarthria strategies in structured activity targeting 3-4 syllable words. Pt achieved 100% intelligibility given occasional mod A with previously mentioned cues.  03-05-23: Eval only    PATIENT EDUCATION: Education details: see above Person educated: Patient Education method: Explanation, Demonstration, and Handouts Education comprehension: verbalized understanding, returned demonstration, and needs further education   GOALS: Goals reviewed with patient? Yes  SHORT TERM GOALS: Target date: 04/02/2023  Pt will utilize dysarthria strategies during structured activity to be 100% intelligible with occasional min A  Baseline: Goal status: IN PROGRESS  2.  Pt will demonstrate increased awareness of speech errors with attempts to self-correct for 50% of opportunities in structured activity  Baseline:  Goal status: IN PROGRESS  3.  Pt will utilize word retrieval strategies during structured conversations x2 with occasional min A  Baseline:  Goal status: IN PROGRESS  4.  Pt will self-advocate for additional processing time as needed for 2/2 opportunities without prompting  Baseline:  Goal status: IN PROGRESS  LONG TERM GOALS: Target date: 04/30/2023  Pt will utilize dysarthria strategies during unstructured conversation to be 100% intelligible with rare min A  Baseline:  Goal status: IN PROGRESS  2.  Pt will demonstrate increased awareness of speech errors and  self-correct for 80% of opportunities in unstructured conversations x2 Baseline:  Goal status: IN PROGRESS  3.  Pt will utilize word retrieval strategies during unstructured conversations x2 with rare min A  Baseline:  Goal status: IN PROGRESS  4.  Pt will report improved communication effectiveness via PROM by 2 points at LTG date Baseline: CPIB=15 Goal status: IN PROGRESS   ASSESSMENT:  CLINICAL IMPRESSION: Patient is a 52 y.o. female who was seen today for speech and cognitive changes s/p recurrent strokes. Presents with word-finding difficulties, phonemic paraphasias, dysarthria c/b imprecise articulation, and reduced intelligibility. States her speech feels like its "dragging." Intelligibility increased to ~95% accuracy in structured conversation when using trained dysarthria strategies. Pt will continue to benefit from ST strategies to facilitate carryover, increase communication effectiveness, and improve functional independence.   OBJECTIVE IMPAIRMENTS: include attention, memory, awareness, aphasia, and dysarthria. These impairments are limiting patient from effectively communicating at home and in community. Factors affecting potential to achieve goals and functional outcome are co-morbidities and medical prognosis. Patient will benefit from skilled SLP services to address above impairments and improve overall function.  REHAB POTENTIAL: Good  PLAN:  SLP FREQUENCY: 1-2x/week  SLP DURATION: 8 weeks  PLANNED INTERVENTIONS: Cueing hierachy, Cognitive reorganization, Internal/external aids, Functional tasks, SLP instruction and feedback, Compensatory strategies, and Patient/family education    Gracy Racer, CCC-SLP 03/12/2023, 1:22 PM

## 2023-03-14 ENCOUNTER — Ambulatory Visit: Payer: 59 | Attending: Internal Medicine | Admitting: Speech Pathology

## 2023-03-21 ENCOUNTER — Ambulatory Visit: Payer: 59 | Attending: Internal Medicine | Admitting: Speech Pathology

## 2023-03-22 ENCOUNTER — Encounter: Payer: 59 | Attending: Psychology | Admitting: Psychology

## 2023-03-22 DIAGNOSIS — I69398 Other sequelae of cerebral infarction: Secondary | ICD-10-CM | POA: Diagnosis not present

## 2023-03-22 DIAGNOSIS — I639 Cerebral infarction, unspecified: Secondary | ICD-10-CM | POA: Insufficient documentation

## 2023-03-22 DIAGNOSIS — R202 Paresthesia of skin: Secondary | ICD-10-CM | POA: Insufficient documentation

## 2023-03-22 DIAGNOSIS — R531 Weakness: Secondary | ICD-10-CM | POA: Diagnosis present

## 2023-03-22 DIAGNOSIS — F482 Pseudobulbar affect: Secondary | ICD-10-CM | POA: Insufficient documentation

## 2023-03-22 DIAGNOSIS — R200 Anesthesia of skin: Secondary | ICD-10-CM | POA: Diagnosis not present

## 2023-03-22 DIAGNOSIS — R269 Unspecified abnormalities of gait and mobility: Secondary | ICD-10-CM | POA: Insufficient documentation

## 2023-03-26 ENCOUNTER — Encounter: Payer: Self-pay | Admitting: Psychology

## 2023-03-26 NOTE — Progress Notes (Signed)
Neuropsychological Consultation   Patient:   Christina Orozco   DOB:   [birth year]  MR Number:  [id number]  Location:  [facility] [address] [place], [address] [place] [address] Dept: [phone]           Date of Service:   03/22/2023  Location of Service and Individuals present: Today's visit was conducted in my outpatient clinic office with the patient and myself present.  Start Time:   2 PM End Time:   3 PM  Today's visit was conducted in my outpatient clinic office and was 1 hour in duration.   Provider/Observer:  Arley Phenix, Psy.D.       Clinical Neuropsychologist       Billing Code/Service: [phone]  Chief Complaint:     Chief Complaint  Patient presents with   Gait Problem   Cerebrovascular Accident   Memory Loss   Sleeping Problem   Stress    Reason for Service:    Christina Orozco is a 52 year old female referred for neuropsychological/psychological therapeutic interventions with a history of multiple previous cerebrovascular accidents.  The patient was recently seen in the inpatient comprehensive rehabilitation program and I saw the patient while she was admitted on 08/30/2022.  Patient has a past medical history that includes significant hypertension, hyperlipidemia, controlled type 2 diabetes and prior history of multiple strokes/TIA.  Patient also has a history of depression and has taken Cymbalta in the past with no current psychotropic medications.  Patient has had previous diagnoses including obstructive sleep apnea and was on CPAP prior.  Patient had a more recent sleep study done that was required to get a new CPAP machine and at that point the sleep study was not definitive.  The sleep apnea machine was not reviewed/renewed although I think in this particular case we should push to restarting that.  The most recent CVA occurred on 08/22/2022 resulting in left-sided  weakness, dizziness and aphasia.  MRI brain at the time identified punctuate foci of acute/early subacute ischemia in the lateral margins of the left basal ganglia.  During the clinical interview today, the patient reports of her 10 or more cerebrovascular events that she has suffered.  The patient reports that with her most recent stroke and continuing after her discharge from CIR the patient would cry at the drop of a hat and that her crying and emotional shifts were much stronger than it had ever been in her life.  Patient reports that some of this is improved but she continues to have much more reactive emotional response.  The patient reports that her negative emotional responses are very strong and different than her usual past emotional responses.  Patient reports that when she first got out of the hospital that she was scared to go to sleep for fear of having another stroke event with this fear has improved somewhat.  The patient was afraid that she would not wake up in the morning and die in her sleep.  The patient has started using a CPAP machine again.  Patient also has a history of seizure in 2019.  Patient, with a lot of effort, has improved her metabolic status related to her diabetes and her A1c has reduced significantly and is now in an appropriate range.  Patient reports that her first stroke was in 2016 after she stopped using her CPAP machine.  Patient reports that she still has some vision loss and has  stopped driving and continues to experience motor deficits and speech fluency changes.  Patient has had multiple hospitalizations and rehabilitative efforts and continues with left-sided weakness.  03/08/2023 3 PM-4 PM: The patient returns today reporting that her mood has been much improved since our last visit.  The patient reports that her daughter has moved out to go back to college which is lowered the stress in the house considerably.  The patient reports that she did have another stroke  event that she was not even aware of.  She had gone in for a repeat MRI and that identified more small strokes.  The patient is going in for TEE study to try to identify triggers for her strokes.  The patient has now had stroke events in May, July and now September.  The patient reports that she continues to have a great deal of fear to fall asleep without her CPAP machine and remains quite diligent.  We continue to work on therapeutic interventions around coping and adjustment with repeat strokes.  03/22/2023 2 PM-3 PM: The patient returns today reporting that she has continued to have some stressors but it is better than it had been.  The patient has finished her TEE study can be found in her EMR.  The patient reports that her mother who was supposed to come and spend a week with her in preparation and immediately after her cardiovascular workup was immediately trying to leave before this was even done.  The mother left the day after it was done which caused a lot of distress for the patient but was not surprising.  The patient's mother is quite paranoid and had a lot of paranoid ideation during the visit.  We continue to work on therapeutic interventions around coping and adjustment.  Medical History:   Past Medical History:  Diagnosis Date   Anemia    severe   Anxiety    Asthma    CKD (chronic kidney disease)    stage IV   DDD (degenerative disc disease), cervical    Depression    Diabetes mellitus (HCC)    Dislocated shoulder    right   GERD (gastroesophageal reflux disease)    Headache    migraines (about once a month)   History of kidney stones    Hypertension    Neuropathy    Peripheral vascular disease (HCC)    Pneumonia    Stroke [facility])          Patient Active Problem List   Diagnosis Date Noted   Diplopia 10/31/2022   OSA (obstructive sleep apnea) 08/25/2022   Subcortical infarction (HCC) 08/23/2022   Overweight (BMI 25.0-29.9) 08/23/2022   Asthma, chronic 08/22/2022    TIA (transient ischemic attack) 06/20/2022   Numbness and tingling in left hand 03/02/2022   Gait disturbance, post-stroke 03/02/2022   AKI (acute kidney injury) (HCC) 12/23/2021   Acute embolic stroke (HCC) 12/23/2021   Hypernatremia 12/20/2021   Chronic kidney disease (CKD), stage IV (severe) (HCC) 12/19/2021   Left-sided weakness and visual deficits  12/19/2021   Vision abnormalities 12/19/2021   Acute stroke due to ischemia [facility]) 11/10/2021   Depression 07/05/2020   CVA (cerebral vascular accident) (HCC) 06/21/2020   CVA (cerebrovascular accident) (HCC) 06/19/2020   Generalized anxiety disorder 03/01/2019   Abnormality of gait 10/14/2018   Dizziness and giddiness 09/13/2018   Debility 06/26/2018   Chronic nonintractable headache 04/26/2018   Diastolic dysfunction    Congestion of nasal sinus    Pneumonia of  left lower lobe due to infectious organism    SOB (shortness of breath)    Candidiasis    Anemia    Hypoalbuminemia due to protein-calorie malnutrition (HCC)    Hypertension    Type 2 diabetes mellitus with chronic kidney disease, without long-term current use of insulin (HCC)    PRES (posterior reversible encephalopathy syndrome)    Hypokalemia    History of CVA with residual deficit    History of seizure 03/25/2018   Bilateral carpal tunnel syndrome 02/14/2018   Arthritis 10/31/2017   Asthma 10/31/2017   Constipation 10/31/2017   Kidney stone 10/31/2017   Anemia, chronic disease 08/09/2017   Hyperlipidemia 07/26/2017   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HCC) 07/25/2017   Anxiety as acute reaction to exceptional stress 04/23/2017   Insomnia 10/08/2016   Neuropathy 06/07/2016   Gait instability    Lesion of pons    Slurred speech    Vertigo 04/30/2016   Hyperglycemia 04/30/2016   Chronic kidney disease (CKD), stage III (moderate) (HCC) 04/30/2016   Ataxia 04/30/2016   Chronic right shoulder pain 04/03/2016   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  Most recent MRI performed on 10/28/2022 indicated chronic severe signal abnormality in the brainstem and cerebellar peduncles, multifocal chronic bilateral deep gray nuclei lacunar infarcts.  An extensive cerebral white matter T2 and FLAIR hyperintensities including some areas of corpus callosum.  Encephalomalacia no and lesions oriented perpendicular to lateral ventricles are also noted.  A moderate number of chronic microhemorrhages are scattered in the bilateral deep gray nuclei especially throughout the brainstem.  Behavioral Observation/Mental Status:   Heyley Warden  presents as a 52 y.o.-year-old Right handed African American Female who appeared her stated age. her dress was Appropriate and she was Well Groomed and her manners were Appropriate to the situation.  her participation was indicative of Appropriate behaviors.  There were physical disabilities noted.  she displayed an appropriate level of cooperation and motivation.    Interactions:    Active Appropriate  Attention:   abnormal and attention span appeared shorter than expected for age  Memory:   within normal limits; recent and remote memory intact  Visuo-spatial:   not examined  Speech (Volume):  normal  Speech:   normal; slurred  Thought Process:  Coherent and Relevant  Coherent and Logical  Though Content:  WNL; not suicidal and not homicidal  Orientation:   person, place, time/date, and situation  Judgment:   Fair  Planning:   Fair  Affect:    Anxious  Mood:    Anxious  Insight:   Good  Intelligence:   normal  Marital Status/Living:  The patient was born and raised in [place] along with 1 sister and 2  brothers.  The patient continues to live with her youngest daughter.  Patient is single and is never married.  The patient has 3 adult children age 80, 20.  Educational and Occupational History:     Highest Level of Education:   Patient graduated from high school and has taken some college courses at [employer].  Her best subject was math and had some relative weaknesses with regard to reading.  She did repeat the second grade.  Current Occupation:  The patient is disabled due to her cerebrovascular events.  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  Psychiatric History:  The patient has a history of anxiety and acute reaction to significant psychosocial stressors in the past and history of some mild depression but her emotional status has become quite variable more recently with patterns consistent with pseudobulbar affect type symptoms.  History of Substance Use or Abuse:  No concerns of substance abuse are reported.    Family Med/Psych History:  Family History  Problem Relation Age of Onset   Vascular Disease Mother    CAD Mother    Heart failure Mother    Heart disease Other    Cancer Other        colon, stomach, pancreatic, lung   Diabetes Other    Breast cancer Sister 30   Seizures Maternal Uncle     Impression/DX:   Appollonia Smoley is a 52 year old female referred for neuropsychological/psychological therapeutic interventions with a history of multiple previous cerebrovascular accidents.  The patient was recently seen in the inpatient comprehensive rehabilitation program and I saw the patient while she was admitted on 08/30/2022.  Patient has a past medical history that includes significant hypertension, hyperlipidemia, controlled type 2 diabetes and prior history of multiple strokes/TIA.  Patient also has a history of depression and has taken Cymbalta in the past with no current psychotropic medications.  Patient has had  previous diagnoses including obstructive sleep apnea and was on CPAP prior.  Patient had a more recent sleep study done that was required to get a new CPAP machine and at that point the sleep study was not definitive.  The sleep apnea machine was not reviewed/renewed although I think in this particular case we should push to restarting that.  The most recent CVA occurred on 08/22/2022 resulting in left-sided weakness, dizziness and aphasia.  MRI brain at the time identified punctuate foci of acute/early subacute ischemia in the lateral margins of the left basal ganglia.  During the clinical interview today, the patient reports of her 10 or more cerebrovascular events that she has suffered.  The patient reports that with her most recent stroke and continuing after her discharge from CIR the patient would cry at the drop of a hat and that her crying and emotional shifts were much stronger than it had ever been in her life.  Patient reports that some of this is improved but she continues to have much more reactive emotional response.  The patient reports that her negative emotional responses are very strong and different than her usual past emotional responses.  Patient reports that when she first got out of the hospital that she was scared to go to sleep for fear of having another stroke event with this fear has improved somewhat.  The patient was afraid that she would not wake up in the morning and die in her sleep.  The patient has started using a CPAP machine again.  Patient also has a history of seizure in 2019.  Patient, with a lot of effort, has improved her metabolic status related to her diabetes and her A1c has reduced significantly and is now in an appropriate range.  Patient reports that her first stroke was in 2016 after she stopped using her CPAP machine.  Patient reports that she still has some vision loss and has stopped driving and continues to experience motor deficits and speech fluency changes.   Patient has had multiple hospitalizations  and rehabilitative efforts and continues with left-sided weakness.  03/08/2023 3 PM-4 PM: The patient returns today reporting that her mood has been much improved since our last visit.  The patient reports that her daughter has moved out to go back to college which is lowered the stress in the house considerably.  The patient reports that she did have another stroke event that she was not even aware of.  She had gone in for a repeat MRI and that identified more small strokes.  The patient is going in for TEE study to try to identify triggers for her strokes.  The patient has now had stroke events in May, July and now September.  The patient reports that she continues to have a great deal of fear to fall asleep without her CPAP machine and remains quite diligent.  We continue to work on therapeutic interventions around coping and adjustment with repeat strokes.  03/22/2023 2 PM-3 PM: The patient returns today reporting that she has continued to have some stressors but it is better than it had been.  The patient has finished her TEE study can be found in her EMR.  The patient reports that her mother who was supposed to come and spend a week with her in preparation and immediately after her cardiovascular workup was immediately trying to leave before this was even done.  The mother left the day after it was done which caused a lot of distress for the patient but was not surprising.  The patient's mother is quite paranoid and had a lot of paranoid ideation during the visit.  We continue to work on therapeutic interventions around coping and adjustment.  Diagnosis:    PBA (pseudobulbar affect)  Numbness and tingling in left hand  Gait disturbance, post-stroke  Left-sided weakness and visual deficits   Subcortical infarction [facility])        Note: This document was prepared using Dragon voice recognition software and may include unintentional dictation errors.    Electronically Signed   _______________________ Arley Phenix, Psy.D. Clinical Neuropsychologist

## 2023-03-27 ENCOUNTER — Ambulatory Visit: Payer: 59 | Attending: Internal Medicine | Admitting: Speech Pathology

## 2023-03-27 DIAGNOSIS — R41840 Attention and concentration deficit: Secondary | ICD-10-CM | POA: Diagnosis present

## 2023-03-27 DIAGNOSIS — R278 Other lack of coordination: Secondary | ICD-10-CM | POA: Diagnosis present

## 2023-03-27 DIAGNOSIS — M6281 Muscle weakness (generalized): Secondary | ICD-10-CM | POA: Insufficient documentation

## 2023-03-27 DIAGNOSIS — R471 Dysarthria and anarthria: Secondary | ICD-10-CM | POA: Insufficient documentation

## 2023-03-27 DIAGNOSIS — R41844 Frontal lobe and executive function deficit: Secondary | ICD-10-CM | POA: Insufficient documentation

## 2023-03-27 DIAGNOSIS — R41842 Visuospatial deficit: Secondary | ICD-10-CM | POA: Diagnosis present

## 2023-03-27 DIAGNOSIS — R29818 Other symptoms and signs involving the nervous system: Secondary | ICD-10-CM | POA: Diagnosis present

## 2023-03-27 DIAGNOSIS — R4701 Aphasia: Secondary | ICD-10-CM | POA: Insufficient documentation

## 2023-03-27 DIAGNOSIS — R41841 Cognitive communication deficit: Secondary | ICD-10-CM | POA: Insufficient documentation

## 2023-03-27 NOTE — Patient Instructions (Addendum)
When you have trouble saying the word you want to say:  1)  Describe it! Describe the size, color, shape, function, composition (what it's made of), and/or location to be able to have the word come sooner, or to have your listener help you out  2) "talk around the word" (say it a totally different way) -get your point out using different words than the one/ones you can't think of  3) Use a synonym - think of another word that means the exact same thing  4) DRAW! You can draw some things you want to say in order to give your listener a hint about what you're talking about  5)  Gesture- make motions to help your listener understand what you are trying to communicate  6) Write down the word, if you can - or the first letter or letters, to help you say the word or to give your listener a hint about what you're trying to say   

## 2023-03-27 NOTE — Therapy (Signed)
OUTPATIENT SPEECH LANGUAGE PATHOLOGY TREATMENT   Patient Name: Christina Orozco MRN: [id number] DOB:[birth year], 52 y.o., female Today's Date: 03/27/2023  PCP: Kerin Salen, PA-C REFERRING PROVIDER: Kerin Salen, PA-C  END OF SESSION:  End of Session - 03/27/23 1230     Visit Number 3    Number of Visits 17    Date for SLP Re-Evaluation 04/30/23    Authorization Type [employer] Dual Complete    Progress Note Due on Visit 10    SLP Start Time 1230    SLP Stop Time  1315    SLP Time Calculation (min) 45 min    Activity Tolerance Patient tolerated treatment well              Past Medical History:  Diagnosis Date   Anemia    severe   Anxiety    Asthma    CKD (chronic kidney disease)    stage IV   DDD (degenerative disc disease), cervical    Depression    Diabetes mellitus (HCC)    Dislocated shoulder    right   GERD (gastroesophageal reflux disease)    Headache    migraines (about once a month)   History of kidney stones    Hypertension    Neuropathy    Peripheral vascular disease (HCC)    Pneumonia    Stroke [facility])    Past Surgical History:  Procedure Laterality Date   ADENOIDECTOMY     APPENDECTOMY     CERVICAL SPINE SURGERY  06/2012   C5-C6   EYE SURGERY     left eye surgery    FOOT SURGERY     KENALOG INJECTION Left 03/10/2021   Procedure: Intravitreal Avastin Injection;  Surgeon: Stephannie Li, MD;  Location: [facility] OR;  Service: Ophthalmology;  Laterality: Left;   LOOP RECORDER INSERTION N/A 08/27/2017   Procedure: LOOP RECORDER INSERTION;  Surgeon: Thurmon Fair, MD;  Location: [facility] IN[facility] LAB;  Service: Cardiovascular;  Laterality: N/A;   MEMBRANE PEEL Left 03/10/2021   Procedure: MEMBRANE PEEL;  Surgeon: Stephannie Li, MD;  Location: [facility] OR;  Service: Ophthalmology;  Laterality: Left;   PARS PLANA VITRECTOMY Left 03/10/2021   Procedure: TWENTY-FIVE GAUGE PARS PLANA VITRECTOMY;  Surgeon: Stephannie Li, MD;  Location: [facility] OR;   Service: Ophthalmology;  Laterality: Left;   PHOTOCOAGULATION WITH LASER Left 03/10/2021   Procedure: PHOTOCOAGULATION WITH LASER;  Surgeon: Stephannie Li, MD;  Location: [facility] OR;  Service: Ophthalmology;  Laterality: Left;   SHOULDER SURGERY     TEE WITHOUT CARDIOVERSION N/A 08/27/2017   Procedure: TRANSESOPHAGEAL ECHOCARDIOGRAM (TEE);  Surgeon: Thurmon Fair, MD;  Location: [facility] ENDOSCOPY;  Service: Cardiovascular;  Laterality: N/A;   TONSILLECTOMY     Patient Active Problem List   Diagnosis Date Noted   Diplopia 10/31/2022   OSA (obstructive sleep apnea) 08/25/2022   Subcortical infarction (HCC) 08/23/2022   Overweight (BMI 25.0-29.9) 08/23/2022   Asthma, chronic 08/22/2022   TIA (transient ischemic attack) 06/20/2022   Numbness and tingling in left hand 03/02/2022   Gait disturbance, post-stroke 03/02/2022   AKI (acute kidney injury) (HCC) 12/23/2021   Acute embolic stroke (HCC) 12/23/2021   Hypernatremia 12/20/2021   Chronic kidney disease (CKD), stage IV (severe) (HCC) 12/19/2021   Left-sided weakness and visual deficits  12/19/2021   Vision abnormalities 12/19/2021   Acute stroke due to ischemia [facility]) 11/10/2021   Depression 07/05/2020   CVA (cerebral vascular accident) (HCC) 06/21/2020   CVA (cerebrovascular accident) (HCC) 06/19/2020   Generalized anxiety  disorder 03/01/2019   Abnormality of gait 10/14/2018   Dizziness and giddiness 09/13/2018   Debility 06/26/2018   Chronic nonintractable headache 04/26/2018   Diastolic dysfunction    Congestion of nasal sinus    Pneumonia of left lower lobe due to infectious organism    SOB (shortness of breath)    Candidiasis    Anemia    Hypoalbuminemia due to protein-calorie malnutrition (HCC)    Hypertension    Type 2 diabetes mellitus with chronic kidney disease, without long-term current use of insulin (HCC)    PRES (posterior reversible encephalopathy syndrome)    Hypokalemia    History of CVA with residual deficit     History of seizure 03/25/2018   Bilateral carpal tunnel syndrome 02/14/2018   Arthritis 10/31/2017   Asthma 10/31/2017   Constipation 10/31/2017   Kidney stone 10/31/2017   Anemia, chronic disease 08/09/2017   Hyperlipidemia 07/26/2017   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HCC) 07/25/2017   Anxiety as acute reaction to exceptional stress 04/23/2017   Insomnia 10/08/2016   Neuropathy 06/07/2016   Gait instability    Lesion of pons    Slurred speech    Vertigo 04/30/2016   Hyperglycemia 04/30/2016   Chronic kidney disease (CKD), stage III (moderate) (HCC) 04/30/2016   Ataxia 04/30/2016   Chronic right shoulder pain 04/03/2016   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   Rhinitis, allergic 08/25/2015   Asthma, mild intermittent 07/14/2015   Gastroesophageal reflux disease without esophagitis 06/08/2015   Hypertensive cardiovascular disease 05/05/2015   Vitamin D deficiency 05/05/2015    ONSET DATE: 02/20/23 (referral date)   REFERRING DIAG: I63.9 (ICD-10-CM) - Recurrent stroke  THERAPY DIAG:  Dysarthria and anarthria  Aphasia  Rationale for Evaluation and Treatment: Rehabilitation  SUBJECTIVE:   SUBJECTIVE STATEMENT: "I had my eye surgery last week" Pt accompanied by: self  PERTINENT HISTORY:  recurrent stroke (ILR placed 2019 removed 07/2019 after no atrial arrhythmias noted), seizure, vertigo. Scheduled to have cataract surgery in October   PAIN: Are you having pain? No  FALLS: Has patient fallen in last 6 months?  Yes, See PT evaluation for details  LIVING ENVIRONMENT: Lives with: lives alone Lives in: House/apartment  PLOF:  Level of assistance: Needed assistance with ADLs, Needed assistance with IADLS Employment: On disability  PATIENT GOALS: "To talk. I want my mom to stop correcting me (when I talk)"  OBJECTIVE:   TODAY'S TREATMENT:                                                                                                                                          03/27/23: SLP introduced anomia strategies to aid in recovery when word finding difficulties present. See pt instructions. Practiced description strategy with pt able to fully describe x1 target with occasional min-A. Reviewed dysarthria strategies addressed last session. SLP led pt through structured practice of employment of dysarthria and anomia strategies in  picture description task. Pt presenting with improved articulation in structured task vs unstructured conversation following. However, pt evidences 100% intelligibility without demonstration of strategies. Pt supports she was not utilizing strategies in unstructured conversation. Corrected articulation errors x3 in today's session.  03-12-23: SLP educated pt on dysarthria strategies to increase intelligibility. Targeted in structured naming and description activity. When utilizing strategies, pt increased to 95% intelligibility given occasional mod A. Benefited from cues to think of what she was going to say first, slow down, and over-enunciate. Experienced one instance of imprecise articulation which led to increased frustration. Pt successful with word once given visual cue and SLP model of breaking word into syllables. Practiced dysarthria strategies in structured activity targeting 3-4 syllable words. Pt achieved 100% intelligibility given occasional mod A with previously mentioned cues.  03-05-23: Eval only    PATIENT EDUCATION: Education details: see above Person educated: Patient Education method: Explanation, Demonstration, and Handouts Education comprehension: verbalized understanding, returned demonstration, and needs further education   GOALS: Goals reviewed with patient? Yes  SHORT TERM GOALS: Target date: 04/02/2023  Pt will utilize dysarthria strategies during structured activity to be 100% intelligible with occasional min A  Baseline: Goal status: MET  2.  Pt will demonstrate increased awareness of speech errors with  attempts to self-correct for 50% of opportunities in structured activity  Baseline:  Goal status: IN PROGRESS  3.  Pt will utilize word retrieval strategies during structured conversations x2 with occasional min A  Baseline:  Goal status: MET  4.  Pt will self-advocate for additional processing time as needed for 2/2 opportunities without prompting  Baseline:  Goal status: IN PROGRESS  LONG TERM GOALS: Target date: 04/30/2023  Pt will utilize dysarthria strategies during unstructured conversation to be 100% intelligible with rare min A  Baseline:  Goal status: IN PROGRESS  2.  Pt will demonstrate increased awareness of speech errors and self-correct for 80% of opportunities in unstructured conversations x2 Baseline:  Goal status: IN PROGRESS  3.  Pt will utilize word retrieval strategies during unstructured conversations x2 with rare min A  Baseline:  Goal status: IN PROGRESS  4.  Pt will report improved communication effectiveness via PROM by 2 points at LTG date Baseline: CPIB=15 Goal status: IN PROGRESS   ASSESSMENT:  CLINICAL IMPRESSION: Patient is a 52 y.o. female who was seen today for speech and cognitive changes s/p recurrent strokes. Presents with word-finding difficulties, phonemic paraphasias, dysarthria c/b imprecise articulation, and reduced intelligibility. States her speech feels like its "dragging." Intelligibility increased to ~95% accuracy in structured conversation when using trained dysarthria strategies. Pt will continue to benefit from ST strategies to facilitate carryover, increase communication effectiveness, and improve functional independence.   OBJECTIVE IMPAIRMENTS: include attention, memory, awareness, aphasia, and dysarthria. These impairments are limiting patient from effectively communicating at home and in community. Factors affecting potential to achieve goals and functional outcome are co-morbidities and medical prognosis. Patient will benefit  from skilled SLP services to address above impairments and improve overall function.  REHAB POTENTIAL: Good  PLAN:  SLP FREQUENCY: 1-2x/week  SLP DURATION: 8 weeks  PLANNED INTERVENTIONS: Cueing hierachy, Cognitive reorganization, Internal/external aids, Functional tasks, SLP instruction and feedback, Compensatory strategies, and Patient/family education    Maia Breslow, CCC-SLP 03/27/2023, 2:55 PM

## 2023-03-29 ENCOUNTER — Ambulatory Visit: Payer: 59 | Admitting: Speech Pathology

## 2023-03-29 ENCOUNTER — Encounter: Payer: Self-pay | Admitting: Occupational Therapy

## 2023-03-29 ENCOUNTER — Ambulatory Visit: Payer: 59 | Admitting: Occupational Therapy

## 2023-03-29 ENCOUNTER — Encounter: Payer: Self-pay | Admitting: Speech Pathology

## 2023-03-29 DIAGNOSIS — M6281 Muscle weakness (generalized): Secondary | ICD-10-CM

## 2023-03-29 DIAGNOSIS — R41842 Visuospatial deficit: Secondary | ICD-10-CM

## 2023-03-29 DIAGNOSIS — R29818 Other symptoms and signs involving the nervous system: Secondary | ICD-10-CM

## 2023-03-29 DIAGNOSIS — R471 Dysarthria and anarthria: Secondary | ICD-10-CM

## 2023-03-29 DIAGNOSIS — R278 Other lack of coordination: Secondary | ICD-10-CM

## 2023-03-29 DIAGNOSIS — R41840 Attention and concentration deficit: Secondary | ICD-10-CM

## 2023-03-29 DIAGNOSIS — R41844 Frontal lobe and executive function deficit: Secondary | ICD-10-CM

## 2023-03-29 NOTE — Therapy (Signed)
OUTPATIENT SPEECH LANGUAGE PATHOLOGY TREATMENT   Patient Name: Christina Orozco MRN: [id number] DOB:[birth year], 52 y.o., female Today's Date: 03/29/2023  PCP: Kerin Salen, PA-C REFERRING PROVIDER: Kerin Salen, PA-C  END OF SESSION:  End of Session - 03/29/23 1342     Visit Number 4    Number of Visits 17    Date for SLP Re-Evaluation 04/30/23    Authorization Type [employer] Dual Complete    Progress Note Due on Visit 10    SLP Start Time 1315    SLP Stop Time  1400    SLP Time Calculation (min) 45 min    Activity Tolerance Patient tolerated treatment well               Past Medical History:  Diagnosis Date   Anemia    severe   Anxiety    Asthma    CKD (chronic kidney disease)    stage IV   DDD (degenerative disc disease), cervical    Depression    Diabetes mellitus (HCC)    Dislocated shoulder    right   GERD (gastroesophageal reflux disease)    Headache    migraines (about once a month)   History of kidney stones    Hypertension    Neuropathy    Peripheral vascular disease (HCC)    Pneumonia    Stroke [facility])    Past Surgical History:  Procedure Laterality Date   ADENOIDECTOMY     APPENDECTOMY     CERVICAL SPINE SURGERY  06/2012   C5-C6   EYE SURGERY     left eye surgery    FOOT SURGERY     KENALOG INJECTION Left 03/10/2021   Procedure: Intravitreal Avastin Injection;  Surgeon: Stephannie Li, MD;  Location: [facility] OR;  Service: Ophthalmology;  Laterality: Left;   LOOP RECORDER INSERTION N/A 08/27/2017   Procedure: LOOP RECORDER INSERTION;  Surgeon: Thurmon Fair, MD;  Location: [facility] INVAS[facility] CV LAB;  Service: Cardiovascular;  Laterality: N/A;   MEMBRANE PEEL Left 03/10/2021   Procedure: MEMBRANE PEEL;  Surgeon: Stephannie Li, MD;  Location: [facility] OR;  Service: Ophthalmology;  Laterality: Left;   PARS PLANA VITRECTOMY Left 03/10/2021   Procedure: TWENTY-FIVE GAUGE PARS PLANA VITRECTOMY;  Surgeon: Stephannie Li, MD;  Location: [facility]  OR;  Service: Ophthalmology;  Laterality: Left;   PHOTOCOAGULATION WITH LASER Left 03/10/2021   Procedure: PHOTOCOAGULATION WITH LASER;  Surgeon: Stephannie Li, MD;  Location: [facility] OR;  Service: Ophthalmology;  Laterality: Left;   SHOULDER SURGERY     TEE WITHOUT CARDIOVERSION N/A 08/27/2017   Procedure: TRANSESOPHAGEAL ECHOCARDIOGRAM (TEE);  Surgeon: Thurmon Fair, MD;  Location: [facility] ENDOSCOPY;  Service: Cardiovascular;  Laterality: N/A;   TONSILLECTOMY     Patient Active Problem List   Diagnosis Date Noted   Diplopia 10/31/2022   OSA (obstructive sleep apnea) 08/25/2022   Subcortical infarction (HCC) 08/23/2022   Overweight (BMI 25.0-29.9) 08/23/2022   Asthma, chronic 08/22/2022   TIA (transient ischemic attack) 06/20/2022   Numbness and tingling in left hand 03/02/2022   Gait disturbance, post-stroke 03/02/2022   AKI (acute kidney injury) (HCC) 12/23/2021   Acute embolic stroke (HCC) 12/23/2021   Hypernatremia 12/20/2021   Chronic kidney disease (CKD), stage IV (severe) (HCC) 12/19/2021   Left-sided weakness and visual deficits  12/19/2021   Vision abnormalities 12/19/2021   Acute stroke due to ischemia [facility]) 11/10/2021   Depression 07/05/2020   CVA (cerebral vascular accident) (HCC) 06/21/2020   CVA (cerebrovascular accident) (HCC) 06/19/2020   Generalized  anxiety disorder 03/01/2019   Abnormality of gait 10/14/2018   Dizziness and giddiness 09/13/2018   Debility 06/26/2018   Chronic nonintractable headache 04/26/2018   Diastolic dysfunction    Congestion of nasal sinus    Pneumonia of left lower lobe due to infectious organism    SOB (shortness of breath)    Candidiasis    Anemia    Hypoalbuminemia due to protein-calorie malnutrition (HCC)    Hypertension    Type 2 diabetes mellitus with chronic kidney disease, without long-term current use of insulin (HCC)    PRES (posterior reversible encephalopathy syndrome)    Hypokalemia    History of CVA with residual deficit     History of seizure 03/25/2018   Bilateral carpal tunnel syndrome 02/14/2018   Arthritis 10/31/2017   Asthma 10/31/2017   Constipation 10/31/2017   Kidney stone 10/31/2017   Anemia, chronic disease 08/09/2017   Hyperlipidemia 07/26/2017   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HCC) 07/25/2017   Anxiety as acute reaction to exceptional stress 04/23/2017   Insomnia 10/08/2016   Neuropathy 06/07/2016   Gait instability    Lesion of pons    Slurred speech    Vertigo 04/30/2016   Hyperglycemia 04/30/2016   Chronic kidney disease (CKD), stage III (moderate) (HCC) 04/30/2016   Ataxia 04/30/2016   Chronic right shoulder pain 04/03/2016   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   Rhinitis, allergic 08/25/2015   Asthma, mild intermittent 07/14/2015   Gastroesophageal reflux disease without esophagitis 06/08/2015   Hypertensive cardiovascular disease 05/05/2015   Vitamin D deficiency 05/05/2015    ONSET DATE: 02/20/23 (referral date)   REFERRING DIAG: I63.9 (ICD-10-CM) - Recurrent stroke  THERAPY DIAG:  Dysarthria and anarthria  Rationale for Evaluation and Treatment: Rehabilitation  SUBJECTIVE:   SUBJECTIVE STATEMENT: My mom commented that my speech is getting better." Pt accompanied by: self  PERTINENT HISTORY:  recurrent stroke (ILR placed 2019 removed 07/2019 after no atrial arrhythmias noted), seizure, vertigo. Scheduled to have cataract surgery in October   PAIN: Are you having pain? No  FALLS: Has patient fallen in last 6 months?  Yes, See PT evaluation for details  LIVING ENVIRONMENT: Lives with: lives alone Lives in: House/apartment  PLOF:  Level of assistance: Needed assistance with ADLs, Needed assistance with IADLS Employment: On disability  PATIENT GOALS: "To talk. I want my mom to stop correcting me (when I talk)"  OBJECTIVE:   TODAY'S TREATMENT:                                                                                                                                          03/29/23: Reviewed dysarthria strategies. Pt noted positive affirmation of improving speech from mother. Addressed dysarthria in discourse level language task in which pt summarized 5 minute video stimuli. Pt achieved 100% intelligibility with no cues and occasional self-corrections of mispronounced words.   03/27/23: SLP introduced anomia strategies to aid  in recovery when word finding difficulties present. See pt instructions. Practiced description strategy with pt able to fully describe x1 target with occasional min-A. Reviewed dysarthria strategies addressed last session. SLP led pt through structured practice of employment of dysarthria and anomia strategies in picture description task. Pt presenting with improved articulation in structured task vs unstructured conversation following. However, pt evidences 100% intelligibility without demonstration of strategies. Pt supports she was not utilizing strategies in unstructured conversation. Corrected articulation errors x3 in today's session.  03-12-23: SLP educated pt on dysarthria strategies to increase intelligibility. Targeted in structured naming and description activity. When utilizing strategies, pt increased to 95% intelligibility given occasional mod A. Benefited from cues to think of what she was going to say first, slow down, and over-enunciate. Experienced one instance of imprecise articulation which led to increased frustration. Pt successful with word once given visual cue and SLP model of breaking word into syllables. Practiced dysarthria strategies in structured activity targeting 3-4 syllable words. Pt achieved 100% intelligibility given occasional mod A with previously mentioned cues.  03-05-23: Eval only    PATIENT EDUCATION: Education details: see above Person educated: Patient Education method: Explanation, Demonstration, and Handouts Education comprehension: verbalized understanding, returned demonstration, and needs further  education   GOALS: Goals reviewed with patient? Yes  SHORT TERM GOALS: Target date: 04/02/2023  Pt will utilize dysarthria strategies during structured activity to be 100% intelligible with occasional min A  Baseline: Goal status: MET  2.  Pt will demonstrate increased awareness of speech errors with attempts to self-correct for 50% of opportunities in structured activity  Baseline: 03/29/23 Goal status: MET  3.  Pt will utilize word retrieval strategies during structured conversations x2 with occasional min A  Baseline:  Goal status: MET  4.  Pt will self-advocate for additional processing time as needed for 2/2 opportunities without prompting  Baseline:  Goal status: IN PROGRESS  LONG TERM GOALS: Target date: 04/30/2023  Pt will utilize dysarthria strategies during unstructured conversation to be 100% intelligible with rare min A  Baseline: 03/29/23 Goal status: IN PROGRESS  2.  Pt will demonstrate increased awareness of speech errors and self-correct for 80% of opportunities in unstructured conversations x2 Baseline:  Goal status: IN PROGRESS  3.  Pt will utilize word retrieval strategies during unstructured conversations x2 with rare min A  Baseline:  Goal status: IN PROGRESS  4.  Pt will report improved communication effectiveness via PROM by 2 points at LTG date Baseline: CPIB=15 Goal status: IN PROGRESS   ASSESSMENT:  CLINICAL IMPRESSION: Pt seen today for ST addressing dysarthria. SLP provided review of dysarthria strategies. Led pt through high level practice to mimic conversational speech with increased cognitive load. Pt demonstrating occasional telescoping of multisyllabic words with self-correction in 100% of opportunities. 100% intelligible this date during structured tasks and in conversational exchange. Pt endorses improved speech via telephone with mother. Continue to treat per POC.   OBJECTIVE IMPAIRMENTS: include attention, memory, awareness,  aphasia, and dysarthria. These impairments are limiting patient from effectively communicating at home and in community. Factors affecting potential to achieve goals and functional outcome are co-morbidities and medical prognosis. Patient will benefit from skilled SLP services to address above impairments and improve overall function.  REHAB POTENTIAL: Good  PLAN:  SLP FREQUENCY: 1-2x/week  SLP DURATION: 8 weeks  PLANNED INTERVENTIONS: Cueing hierachy, Cognitive reorganization, Internal/external aids, Functional tasks, SLP instruction and feedback, Compensatory strategies, and Patient/family education    Whitney Muse, Student-SLP 03/29/2023, 2:01 PM

## 2023-03-29 NOTE — Therapy (Signed)
OUTPATIENT OCCUPATIONAL THERAPY NEURO EVALUATION  Patient Name: Christina Orozco MRN: [id number] DOB:Feb 27, 1971, 52 y.o., female Today's Date: 03/29/2023  PCP: Kerin Salen, PA-C REFERRING PROVIDER: Kerin Salen, PA-C  END OF SESSION:  OT End of Session - 03/29/23 1222     Visit Number 1    Number of Visits 16   + evaluation   Date for OT Re-Evaluation 05/25/23    Authorization Type UHC Dual Complete Covered 100% VL: Follows Medicare guidelines    Progress Note Due on Visit 10    OT Start Time 1228    OT Stop Time 1315    OT Time Calculation (min) 47 min    Equipment Utilized During Treatment Testing Material    Activity Tolerance Patient tolerated treatment well    Behavior During Therapy WFL for tasks assessed/performed             Past Medical History:  Diagnosis Date   Anemia    severe   Anxiety    Asthma    CKD (chronic kidney disease)    stage IV   DDD (degenerative disc disease), cervical    Depression    Diabetes mellitus (HCC)    Dislocated shoulder    right   GERD (gastroesophageal reflux disease)    Headache    migraines (about once a month)   History of kidney stones    Hypertension    Neuropathy    Peripheral vascular disease (HCC)    Pneumonia    Stroke [facility])    Past Surgical History:  Procedure Laterality Date   ADENOIDECTOMY     APPENDECTOMY     CERVICAL SPINE SURGERY  06/2012   C5-C6   EYE SURGERY     left eye surgery    FOOT SURGERY     KENALOG INJECTION Left 03/10/2021   Procedure: Intravitreal Avastin Injection;  Surgeon: Stephannie Li, MD;  Location: [facility] OR;  Service: Ophthalmology;  Laterality: Left;   LOOP RECORDER INSERTION N/A 08/27/2017   Procedure: LOOP RECORDER INSERTION;  Surgeon: Thurmon Fair, MD;  Location: [facility] INVASIVE CV LAB;  Service: Cardiovascular;  Laterality: N/A;   MEMBRANE PEEL Left 03/10/2021   Procedure: MEMBRANE PEEL;  Surgeon: Stephannie Li, MD;  Location: [facility] OR;  Service:  Ophthalmology;  Laterality: Left;   PARS PLANA VITRECTOMY Left 03/10/2021   Procedure: TWENTY-FIVE GAUGE PARS PLANA VITRECTOMY;  Surgeon: Stephannie Li, MD;  Location: [facility] OR;  Service: Ophthalmology;  Laterality: Left;   PHOTOCOAGULATION WITH LASER Left 03/10/2021   Procedure: PHOTOCOAGULATION WITH LASER;  Surgeon: Stephannie Li, MD;  Location: [facility] OR;  Service: Ophthalmology;  Laterality: Left;   SHOULDER SURGERY     TEE WITHOUT CARDIOVERSION N/A 08/27/2017   Procedure: TRANSESOPHAGEAL ECHOCARDIOGRAM (TEE);  Surgeon: Thurmon Fair, MD;  Location: [facility] ENDOSCOPY;  Service: Cardiovascular;  Laterality: N/A;   TONSILLECTOMY     Patient Active Problem List   Diagnosis Date Noted   Diplopia 10/31/2022   OSA (obstructive sleep apnea) 08/25/2022   Subcortical infarction (HCC) 08/23/2022   Overweight (BMI 25.0-29.9) 08/23/2022   Asthma, chronic 08/22/2022   TIA (transient ischemic attack) 06/20/2022   Numbness and tingling in left hand 03/02/2022   Gait disturbance, post-stroke 03/02/2022   AKI (acute kidney injury) (HCC) 12/23/2021   Acute embolic stroke (HCC) 12/23/2021   Hypernatremia 12/20/2021   Chronic kidney disease (CKD), stage IV (severe) (HCC) 12/19/2021   Left-sided weakness and visual deficits  12/19/2021   Vision abnormalities 12/19/2021   Acute stroke due to  ischemia (HCC) 11/10/2021   Depression 07/05/2020   CVA (cerebral vascular accident) (HCC) 06/21/2020   CVA (cerebrovascular accident) (HCC) 06/19/2020   Generalized anxiety disorder 03/01/2019   Abnormality of gait 10/14/2018   Dizziness and giddiness 09/13/2018   Debility 06/26/2018   Chronic nonintractable headache 04/26/2018   Diastolic dysfunction    Congestion of nasal sinus    Pneumonia of left lower lobe due to infectious organism    SOB (shortness of breath)    Candidiasis    Anemia    Hypoalbuminemia due to protein-calorie malnutrition (HCC)    Hypertension    Type 2 diabetes mellitus with chronic kidney  disease, without long-term current use of insulin (HCC)    PRES (posterior reversible encephalopathy syndrome)    Hypokalemia    History of CVA with residual deficit    History of seizure 03/25/2018   Bilateral carpal tunnel syndrome 02/14/2018   Arthritis 10/31/2017   Asthma 10/31/2017   Constipation 10/31/2017   Kidney stone 10/31/2017   Anemia, chronic disease 08/09/2017   Hyperlipidemia 07/26/2017   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HCC) 07/25/2017   Anxiety as acute reaction to exceptional stress 04/23/2017   Insomnia 10/08/2016   Neuropathy 06/07/2016   Gait instability    Lesion of pons    Slurred speech    Vertigo 04/30/2016   Hyperglycemia 04/30/2016   Chronic kidney disease (CKD), stage III (moderate) (HCC) 04/30/2016   Ataxia 04/30/2016   Chronic right shoulder pain 04/03/2016   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   Rhinitis, allergic 08/25/2015   Asthma, mild intermittent 07/14/2015   Gastroesophageal reflux disease without esophagitis 06/08/2015   Hypertensive cardiovascular disease 05/05/2015   Vitamin D deficiency 05/05/2015    ONSET DATE: 03/06/2023  REFERRING DIAG: I63.9 (ICD-10-CM) - Recurrent strokes  THERAPY DIAG:  Other lack of coordination  Muscle weakness (generalized)  Visuospatial deficit  Attention and concentration deficit  Frontal lobe and executive function deficit  Other symptoms and signs involving the nervous system  Rationale for Evaluation and Treatment: Rehabilitation  SUBJECTIVE:   SUBJECTIVE STATEMENT: Pt had R cataract eye surgery on 03/15/23. She has had 1 follow up appt and has one more next month - which is when they will get a new prescription. Left eye cataract sx ~ 4 years ago.   Pt reports she has been having "attacks" - mini strokes, every other month and has evidence of at least 4 more incidents from MRI in July to most recent MRI in Sept  Pt reports "My right hand ain't been the same."  She states that she "can't write  name," because it is "hard on me."  Pt also reported that she started a medication after the hospital stay for her emotional difficulties and uncontrollable crying - medication was added to her list by OTR - FLUoxetine (PROZAC) 20 MG 1x/day.   Pt accompanied by: self  PERTINENT HISTORY:  PMHx: Hypertension, OSA, severe diabetes, recurrent stroke (ILR placed 2019 removed 07/2019 after no atrial arrhythmias noted), seizure, CKD IV, asthma, vertigo.   Pt had issues with LE which she reports has been determined to be either a cyst or a lypoma.  PRECAUTIONS: None  WEIGHT BEARING RESTRICTIONS: No   PAIN:  Are you having pain? No  FALLS: Has patient fallen in last 6 months? Yes. Number of falls multiple She fell out of the car at the store, at home in the bathroom trying to reach the railing etc But pt reports no EMT assistance required for any of  her falls. April x2 May x1 June x1 July x2 August/September 0  LIVING ENVIRONMENT: Lives with: lives alone - since August.  Her daughter went back to school and they aren't speaking.  Her brother lives nearby but is only able to help about 1x/week Lives in: handicap accessible apartment Stairs: No Has following equipment at home: Single point cane, Environmental consultant - 2 wheeled, Environmental consultant - 4 wheeled, shower chair, bed side commode, and Grab bars  PLOF: Independent about 1 year ago but most recently has had increased help.  She currently - does not drive anymore.  Her brother writes her messages ie) about how long to cook things in the microwave etc.   PATIENT GOALS: Improve RUE coordination for handwriting, cooking and managing buttons.   OBJECTIVE:  Note: Objective measures were completed at Evaluation unless otherwise noted.  HAND DOMINANCE: Right  ADLs: Overall ADLs: Ind but with difficulty.  Doesn't wear things with buttons or don socks very often. Transfers/ambulation related to ADLs: Eating: Ind Grooming: Ind UB Dressing: Ind - only wears  pull on clothes LB Dressing: Ind Toileting: Ind Bathing: Mod I - tries to shower daily. Tub Shower transfers: Mod I  Equipment: Shower seat without back, Grab bars, bed side commode, and Reacher  IADLs: Shopping: uses power wheelchair at stores to get around; uses public GTA transportation [employer]: Not doing sweeping etc but is working on [employer] Prep: Hard - mostly microwave meals/premade Education officer, community. Community mobility: Mod I with GTA and rollator Medication management: Meds come in bubble packs Financial management: Ind - pays rent online Handwriting: 50% legible - tearful  MOBILITY STATUS: Independent  POSTURE COMMENTS: rollator rounded shoulders and forward head Sitting balance: WFL  ACTIVITY TOLERANCE: Activity tolerance: Fair  FUNCTIONAL OUTCOME MEASURES: Patient Specific Functional Scale: 1.7   UPPER EXTREMITY ROM:    Active ROM Right eval Left eval  Shoulder flexion WFL but slow WNL  Shoulder abduction    Shoulder adduction    Shoulder extension    Shoulder internal rotation    Shoulder external rotation    Elbow flexion    Elbow extension    Wrist flexion    Wrist extension    Wrist ulnar deviation    Wrist radial deviation    Wrist pronation    Wrist supination    (Blank rows = not tested)  UPPER EXTREMITY MMT:     MMT Right eval Left eval  Shoulder flexion 3/5 3+/5  Shoulder abduction    Shoulder adduction    Shoulder extension    Shoulder internal rotation    Shoulder external rotation    Middle trapezius    Lower trapezius    Elbow flexion    Elbow extension    Wrist flexion    Wrist extension    Wrist ulnar deviation    Wrist radial deviation    Wrist pronation    Wrist supination    (Blank rows = not tested)  HAND FUNCTION: Grip strength: Right: 52.9, 49.8, 45.6 lbs; Left: 52.9, 54.2, 55.1 lbs Average: Right: 49.4 lbs Left: 54.1 lbs  COORDINATION: 9 Hole Peg test: Right: 53.17 sec; Left: 48.01  sec  SENSATION: WFL  EDEMA: NA  MUSCLE TONE: Atypical - OTR noted some jitteriness with slight shaking in R>LUE, possibly due to generalized weakness   COGNITION: Overall cognitive status: Impaired - increased memory issues since last onset of issues (Pt in Speech Therapy)  VISION: Subjective report: Recent cataract surgery - awaiting new prescription at  next appt Baseline vision: Bifocals Visual history: cataracts, retinopathy, and diabetic  VISION ASSESSMENT: Not tested  Patient has difficulty with following activities due to following visual impairments: Reading, driving   PERCEPTION: Not tested  PRAXIS: Not tested  OBSERVATIONS: Pt ambulates with rollator with careful maneuvering around chair to sit safely.  She had no loss of balance but did slide into the chair. The pt appears well kept and had socks and shoes donned today which is reports is a first in recent history as socks are hard to get on herself.   TODAY'S TREATMENT:                                                                                                                              DATE: NA   PATIENT EDUCATION: Education details: OT role and POC considerations Person educated: Patient Education method: Explanation Education comprehension: verbalized understanding and needs further education  HOME EXERCISE PROGRAM: TBD   GOALS: Goals reviewed with patient? Yes  SHORT TERM GOALS: Target date: 04/27/23  Patient will demonstrate updated B HEP with 25% verbal cues or less for proper execution. Baseline: New to outpt OT Goal status: INITIAL  2.  Patient will demonstrate at least 50+ lbs R UE grip strength as needed to open jars and other containers. Baseline: 49.4 lbs with consistently lower strength x 3 trials. Goal status: INITIAL  3.  Patient will complete nine-hole peg with use of BUE in 45 seconds or less.  Baseline: Right: 53.17 sec; Left: 48.01 sec Goal status: INITIAL  4.  Patient  will be able to use adaptive equipment/strategies to manage buttons, socks and shoes. Baseline: Avoids clothing with buttons - dresses with pullovers and rarely wears socks Goal status: INITIAL   LONG TERM GOALS: Target date: 05/25/23  Patient will demonstrate updated B HEP with visual handouts only for proper execution. Baseline: New to outpt OT Goal status: INITIAL  2.  Patient will write a sentence, list or check and maintain 75% legibility. Baseline: 50% with large font and emotional/frustrated Goal status: INITIAL  3.  Patient will have ideas re: AE/appliances and recipes for appropriate food options for increased variety of meals ie) Microwave/Mug meals, etc. Baseline: Pt uses microwave with pre made/frozen meals. Goal status: INITIAL  4.  Patient will demonstrate improvement with cooking, buttons and handwriting, indicating improved functional use of affected extremity for PSFS score of 5.0 +. Baseline: Patient Specific Functional Scale: 1.7 Goal status: INITIAL  ASSESSMENT:  CLINICAL IMPRESSION: Patient is a 52 y.o. female who was seen today for occupational therapy evaluation for history of recurrent strokes. Hx includes diplopia, TIAs, depression and anxiety, DM2, B carpal tunnel, neuropathy, vertigo and chronic R shoulder pain, and hypertensive cardiovascular disease. Patient currently presents below  baseline level of functioning demonstrating functional deficits and impairments as noted below. Pt would benefit from skilled OT services in the outpatient setting to work on impairments as noted below to help pt  return to PLOF as able.     PERFORMANCE DEFICITS: in functional skills including ADLs, IADLs, coordination, dexterity, strength, Fine motor control, Gross motor control, balance, decreased knowledge of precautions, decreased knowledge of use of DME, vision, and UE functional use, cognitive skills including attention, emotional, memory, and problem solving, and  psychosocial skills including coping strategies, environmental adaptation, interpersonal interactions, and routines and behaviors.   IMPAIRMENTS: are limiting patient from ADLs, IADLs, work, and leisure.   CO-MORBIDITIES: has co-morbidities such as recent cataract surgery  that affects occupational performance. Patient will benefit from skilled OT to address above impairments and improve overall function.  MODIFICATION OR ASSISTANCE TO COMPLETE EVALUATION: No modification of tasks or assist necessary to complete an evaluation.  OT OCCUPATIONAL PROFILE AND HISTORY: Problem focused assessment: Including review of records relating to presenting problem.  CLINICAL DECISION MAKING: LOW - limited treatment options, no task modification necessary  REHAB POTENTIAL: Good  EVALUATION COMPLEXITY: Low    PLAN:  OT FREQUENCY: 1-2x/week  OT DURATION: 8 weeks  PLANNED INTERVENTIONS: 97535 self care/ADL training, 41660 therapeutic exercise, 97530 therapeutic activity, 97112 neuromuscular re-education, 97113 aquatic therapy, [id number] Cognitive training (first 15 min), 63016 Cognitive training(each additional 15 min), balance training, visual/perceptual remediation/compensation, psychosocial skills training, energy conservation, coping strategies training, patient/family education, and DME and/or AE instructions  RECOMMENDED OTHER SERVICES: Pt planning to get new referral to PT.  Currently receiving ST services.  Pt hoping to get home health services.  CONSULTED AND AGREED WITH PLAN OF CARE: Patient  PLAN FOR NEXT SESSION:  UE HEP (putty) Coordination/writing AE considerations for ADLs/IADLS Further vision evaluation once new glasses are received.   Victorino Sparrow, OT 03/29/2023, 4:45 PM

## 2023-04-02 ENCOUNTER — Ambulatory Visit (INDEPENDENT_AMBULATORY_CARE_PROVIDER_SITE_OTHER): Payer: 59 | Admitting: Cardiovascular Disease

## 2023-04-02 ENCOUNTER — Encounter (HOSPITAL_BASED_OUTPATIENT_CLINIC_OR_DEPARTMENT_OTHER): Payer: Self-pay | Admitting: Cardiovascular Disease

## 2023-04-02 VITALS — BP 124/68 | HR 72 | Ht 62.0 in | Wt 140.6 lb

## 2023-04-02 DIAGNOSIS — I119 Hypertensive heart disease without heart failure: Secondary | ICD-10-CM | POA: Diagnosis not present

## 2023-04-02 DIAGNOSIS — I6302 Cerebral infarction due to thrombosis of basilar artery: Secondary | ICD-10-CM

## 2023-04-02 DIAGNOSIS — I10 Essential (primary) hypertension: Secondary | ICD-10-CM

## 2023-04-02 NOTE — Patient Instructions (Addendum)
Medication Instructions:  Your physician recommends that you continue on your current medications as directed. Please refer to the Current Medication list given to you today.   *If you need a refill on your cardiac medications before your next appointment, please call your pharmacy*  Lab Work: NONE  Testing/Procedures: NONE  Follow-Up: At [facility], you and your health needs are our priority.  As part of our continuing mission to provide you with exceptional heart care, we have created designated Provider Care Teams.  These Care Teams include your primary Cardiologist (physician) and Advanced Practice Providers (APPs -  Physician Assistants and Nurse Practitioners) who all work together to provide you with the care you need, when you need it.  We recommend signing up for the patient portal called "MyChart".  Sign up information is provided on this After Visit Summary.  MyChart is used to connect with patients for Virtual Visits (Telemedicine).  Patients are able to view lab/test results, encounter notes, upcoming appointments, etc.  Non-urgent messages can be sent to your provider as well.   To learn more about what you can do with MyChart, go to [email or web address].    Your next appointment:   6 month(s)  Provider:   Chilton Si, MD or Gillian Shields, NP

## 2023-04-02 NOTE — Therapy (Unsigned)
OUTPATIENT SPEECH LANGUAGE PATHOLOGY TREATMENT   Patient Name: Christina Orozco MRN: [id number] DOB:1971-03-29, 52 y.o., female Today's Date: 04/03/2023  PCP: Kerin Salen, PA-C REFERRING PROVIDER: Kerin Salen, PA-C  END OF SESSION:  End of Session - 04/03/23 1344     Visit Number 5    Number of Visits 17    Date for SLP Re-Evaluation 04/30/23    Authorization Type [employer] Dual Complete    Progress Note Due on Visit 10    SLP Start Time 1345    SLP Stop Time  1430    SLP Time Calculation (min) 45 min    Activity Tolerance Patient tolerated treatment well                Past Medical History:  Diagnosis Date   Anemia    severe   Anxiety    Asthma    CKD (chronic kidney disease)    stage IV   DDD (degenerative disc disease), cervical    Depression    Diabetes mellitus (HCC)    Dislocated shoulder    right   GERD (gastroesophageal reflux disease)    Headache    migraines (about once a month)   History of kidney stones    Hypertension    Neuropathy    Peripheral vascular disease (HCC)    Pneumonia    Stroke [facility])    Past Surgical History:  Procedure Laterality Date   ADENOIDECTOMY     APPENDECTOMY     CERVICAL SPINE SURGERY  06/2012   C5-C6   EYE SURGERY     left eye surgery    FOOT SURGERY     KENALOG INJECTION Left 03/10/2021   Procedure: Intravitreal Avastin Injection;  Surgeon: Stephannie Li, MD;  Location: [facility] OR;  Service: Ophthalmology;  Laterality: Left;   LOOP RECORDER INSERTION N/A 08/27/2017   Procedure: LOOP RECORDER INSERTION;  Surgeon: Thurmon Fair, MD;  Location: [facility] INVASIVE CV LAB;  Service: Cardiovascular;  Laterality: N/A;   MEMBRANE PEEL Left 03/10/2021   Procedure: MEMBRANE PEEL;  Surgeon: Stephannie Li, MD;  Location: [facility] OR;  Service: Ophthalmology;  Laterality: Left;   PARS PLANA VITRECTOMY Left 03/10/2021   Procedure: TWENTY-FIVE GAUGE PARS PLANA VITRECTOMY;  Surgeon: Stephannie Li, MD;  Location: [facility]  OR;  Service: Ophthalmology;  Laterality: Left;   PHOTOCOAGULATION WITH LASER Left 03/10/2021   Procedure: PHOTOCOAGULATION WITH LASER;  Surgeon: Stephannie Li, MD;  Location: [facility] OR;  Service: Ophthalmology;  Laterality: Left;   SHOULDER SURGERY     TEE WITHOUT CARDIOVERSION N/A 08/27/2017   Procedure: TRANSESOPHAGEAL ECHOCARDIOGRAM (TEE);  Surgeon: Thurmon Fair, MD;  Location: [facility] ENDOSCOPY;  Service: Cardiovascular;  Laterality: N/A;   TONSILLECTOMY     Patient Active Problem List   Diagnosis Date Noted   Diplopia 10/31/2022   OSA (obstructive sleep apnea) 08/25/2022   Subcortical infarction (HCC) 08/23/2022   Overweight (BMI 25.0-29.9) 08/23/2022   Asthma, chronic 08/22/2022   TIA (transient ischemic attack) 06/20/2022   Numbness and tingling in left hand 03/02/2022   Gait disturbance, post-stroke 03/02/2022   AKI (acute kidney injury) (HCC) 12/23/2021   Acute embolic stroke (HCC) 12/23/2021   Hypernatremia 12/20/2021   Chronic kidney disease (CKD), stage IV (severe) (HCC) 12/19/2021   Left-sided weakness and visual deficits  12/19/2021   Vision abnormalities 12/19/2021   Acute stroke due to ischemia [facility]) 11/10/2021   Depression 07/05/2020   CVA (cerebral vascular accident) (HCC) 06/21/2020   Generalized anxiety disorder 03/01/2019   Abnormality  of gait 10/14/2018   Dizziness and giddiness 09/13/2018   Debility 06/26/2018   Chronic nonintractable headache 04/26/2018   Diastolic dysfunction    Congestion of nasal sinus    Pneumonia of left lower lobe due to infectious organism    SOB (shortness of breath)    Candidiasis    Anemia    Hypoalbuminemia due to protein-calorie malnutrition (HCC)    Hypertension    Type 2 diabetes mellitus with chronic kidney disease, without long-term current use of insulin (HCC)    PRES (posterior reversible encephalopathy syndrome)    Hypokalemia    History of CVA with residual deficit    History of seizure 03/25/2018   Bilateral carpal  tunnel syndrome 02/14/2018   Arthritis 10/31/2017   Asthma 10/31/2017   Constipation 10/31/2017   Kidney stone 10/31/2017   Anemia, chronic disease 08/09/2017   Hyperlipidemia 07/26/2017   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HCC) 07/25/2017   Anxiety as acute reaction to exceptional stress 04/23/2017   Insomnia 10/08/2016   Neuropathy 06/07/2016   Gait instability    Lesion of pons    Slurred speech    Vertigo 04/30/2016   Hyperglycemia 04/30/2016   Chronic kidney disease (CKD), stage III (moderate) (HCC) 04/30/2016   Ataxia 04/30/2016   Chronic right shoulder pain 04/03/2016   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   Rhinitis, allergic 08/25/2015   Asthma, mild intermittent 07/14/2015   Gastroesophageal reflux disease without esophagitis 06/08/2015   Hypertensive cardiovascular disease 05/05/2015   Vitamin D deficiency 05/05/2015    ONSET DATE: 02/20/23 (referral date)   REFERRING DIAG: I63.9 (ICD-10-CM) - Recurrent stroke  THERAPY DIAG:  Dysarthria and anarthria  Aphasia  Cognitive communication deficit  Rationale for Evaluation and Treatment: Rehabilitation  SUBJECTIVE:   SUBJECTIVE STATEMENT: "My mom actually said I'm doing better"  Pt accompanied by: self  PERTINENT HISTORY:  recurrent stroke (ILR placed 2019 removed 07/2019 after no atrial arrhythmias noted), seizure, vertigo. Scheduled to have cataract surgery in October   PAIN: Are you having pain? No  FALLS: Has patient fallen in last 6 months?  Yes, See PT evaluation for details  LIVING ENVIRONMENT: Lives with: lives alone Lives in: House/apartment  PLOF:  Level of assistance: Needed assistance with ADLs, Needed assistance with IADLS Employment: On disability  PATIENT GOALS: "To talk. I want my mom to stop correcting me (when I talk)"  OBJECTIVE:   TODAY'S TREATMENT:                                                                                                                                          04/03/23: Maintained 100% intelligibility in structured task and extended conversation today, with rare self-corrections of speech errors required. Observed intermittent breathy, pressed voice today, in which SLP cued increased breath support and projection which aided vocal clarity in conversation. Continue to target in upcoming sessions if observed.    03/29/23:  Reviewed dysarthria strategies. Pt noted positive affirmation of improving speech from mother. Addressed dysarthria in discourse level language task in which pt summarized 5 minute video stimuli. Pt achieved 100% intelligibility with no cues and occasional self-corrections of mispronounced words.   03/27/23: SLP introduced anomia strategies to aid in recovery when word finding difficulties present. See pt instructions. Practiced description strategy with pt able to fully describe x1 target with occasional min-A. Reviewed dysarthria strategies addressed last session. SLP led pt through structured practice of employment of dysarthria and anomia strategies in picture description task. Pt presenting with improved articulation in structured task vs unstructured conversation following. However, pt evidences 100% intelligibility without demonstration of strategies. Pt supports she was not utilizing strategies in unstructured conversation. Corrected articulation errors x3 in today's session.  03-12-23: SLP educated pt on dysarthria strategies to increase intelligibility. Targeted in structured naming and description activity. When utilizing strategies, pt increased to 95% intelligibility given occasional mod A. Benefited from cues to think of what she was going to say first, slow down, and over-enunciate. Experienced one instance of imprecise articulation which led to increased frustration. Pt successful with word once given visual cue and SLP model of breaking word into syllables. Practiced dysarthria strategies in structured activity targeting 3-4  syllable words. Pt achieved 100% intelligibility given occasional mod A with previously mentioned cues.  03-05-23: Eval only    PATIENT EDUCATION: Education details: see above Person educated: Patient Education method: Explanation, Demonstration, and Handouts Education comprehension: verbalized understanding, returned demonstration, and needs further education   GOALS: Goals reviewed with patient? Yes  SHORT TERM GOALS: Target date: 04/02/2023  Pt will utilize dysarthria strategies during structured activity to be 100% intelligible with occasional min A  Baseline: Goal status: MET  2.  Pt will demonstrate increased awareness of speech errors with attempts to self-correct for 50% of opportunities in structured activity  Baseline: 03/29/23 Goal status: MET  3.  Pt will utilize word retrieval strategies during structured conversations x2 with occasional min A  Baseline:  Goal status: MET  4.  Pt will self-advocate for additional processing time as needed for 2/2 opportunities without prompting  Baseline:  Goal status: MET  LONG TERM GOALS: Target date: 04/30/2023  Pt will utilize dysarthria strategies during unstructured conversation to be 100% intelligible with rare min A  Baseline: 03/29/23 Goal status: IN PROGRESS  2.  Pt will demonstrate increased awareness of speech errors and self-correct for 80% of opportunities in unstructured conversations x2 Baseline:  Goal status: IN PROGRESS  3.  Pt will utilize word retrieval strategies during unstructured conversations x2 with rare min A  Baseline:  Goal status: IN PROGRESS  4.  Pt will report improved communication effectiveness via PROM by 2 points at LTG date Baseline: CPIB=15 Goal status: IN PROGRESS   ASSESSMENT:  CLINICAL IMPRESSION: Pt seen today for ST addressing dysarthria. SLP provided review of dysarthria strategies. Rated 100% intelligible this date during structured tasks and in conversational exchange  with intermittent cues for projection and increased breath support to optimize vocal quality. Pt endorses improved speech via telephone with mother. Continue to treat per POC.   OBJECTIVE IMPAIRMENTS: include attention, memory, awareness, aphasia, and dysarthria. These impairments are limiting patient from effectively communicating at home and in community. Factors affecting potential to achieve goals and functional outcome are co-morbidities and medical prognosis. Patient will benefit from skilled SLP services to address above impairments and improve overall function.  REHAB POTENTIAL: Good  PLAN:  SLP FREQUENCY: 1-2x/week  SLP DURATION: 8 weeks  PLANNED INTERVENTIONS: Cueing hierachy, Cognitive reorganization, Internal/external aids, Functional tasks, SLP instruction and feedback, Compensatory strategies, and Patient/family education    Gracy Racer, CCC-SLP 04/03/2023, 2:34 PM

## 2023-04-02 NOTE — Progress Notes (Signed)
Cardiology Office Note:  .    Date:  04/02/2023  ID:  Christina Orozco, DOB 24-Nov-1970, MRN [id number] PCP: Christina Salen, PA-C  [facility] Providers Cardiologist:  Christina Si, MD     History of Present Illness: .    Christina Orozco is a 52 y.o. female with hypertension, severe LVH, diabetes, recurrent stroke, seizures, CKD IV, and asthma who presents for follow-up. She was initially seen 12/2016 for an evaluation of hypertension and LVH.  She had an echo 04/2016 that revealed LVEF 65-70% and mild basal septum hypertrophy.  She had a repeat echo 12/01/16 that showed LVEF 65-70% and severe LVH.  She reports that she was initially diagnosed with hypertension in high school.  The only itme it was well-controlled was when she was pregnant. She has several family members with hypertension.  Her mother had a heart attack at age 35 and several other family members had premature CAD.  Carvedilol was increased, amlodipine and doxazosin were added.     [name] subsequently admitted 07/2017 with an acute stroke of the left paramedian pons.  She presented with dizziness and her needed admission blood pressure was 148/85.  Carotid Dopplers revealed 1-39% ICA stenosis and echo was unchanged from 11/2016.  She was scheduled for TEE 08/2017 that revealed no PFO.  A loop recorder was placed at that time.  The loop recorder was removed 07/2019.  She was admitted 02/2019 after recurrent stroke.  She followed up with Corine Shelter, PA-C.  There is concern that her blood pressure has been too low after losing over 50 lb. Hydralazine was discontinued.  She followed up with Franky Macho later that month and her blood pressure was more stable. \She followed up with Christina Shields, NP for preoperative clearance on 09/2021.  At that time she had lower extremity edema.  Her amlodipine was reduced and hydralazine was increased.  She was also encouraged to wear compression socks.   [name] was hospitalized  12/2021 with embolic stroke.  She presented with left-sided weakness.  MRI/MRA revealed a 1.9 cm acute ischemic stroke in the right frontal centrum ovale.  Aspirin and ticagrelor were recommended.  She had acute acute on chronic renal failure.  Renal ultrasound revealed medical renal disease.  She was discharged to [facility] inpatient rehab.  At her visit 01/2022 she was participating in outpatient rehab on Fridays, working on increasing the strength on the L side and walking. There was concern that she may have small vessel disease and neurology was planning to do an LP.  On 06/20/2022 she had a sudden episode of confusion while driving. In the ED, imaging was negative for acute CVA; EEG showed no signs of seizure. She presented to the ED 08/22/2022 as a code stroke with complaints of left vision changes and left leg weakness. Brain MRI showed tiny punctate left subcortical lacunar infarct. Neurology was consulted and she was started on Brilinta for 4 weeks.  She was seen in the ED on 09/20/2022 for evaluation of hypertension. Trop was neg x 2. No evidence of end organ damage. Amlodipine was increased to 10mg  daily.  She followed up with Christina Searing, NP 09/26/22 and her blood pressure was well controlled at 122/64. Home blood pressures were noted to be inaccurate due to a broken cuff and she was provided with a new one. They also discussed following a low-sodium diet to help control her hypertension. At her visit with Christina Shields, NP 09/2022, home blood pressures were averaging 130-150 systolic  on the new arm cuff. Carvedilol was increased to 25 mg BID. She complained of some LE edema and was provided a prescription for compression stockings. She was subsequently admitted 5/17 - 10/31/2022 with CVA (punctate acute right temporal lobe white matter lacunar infarct, 2 small subacute appearing white matter infarct in left corona radiata). Gentle hydration initiated for AKI with creatinine as high as 3.2. She saw her neurologist  who recommended ASA 81mg  BID and Brilinta 90mg  BID. She was referred to stroke specialist at [facility] due to refractory strokes. Repatha has been recommended but was not covered by her insurance.  Today, she reports that since September she has been unable to write due to her hand weakness. She is unable to cook meals at home so she has been eating [employer] frozen dinners to try and manage her sodium. In the office her blood pressure is 130/86 initially, and 124/68 on manual recheck. At home she sees readings closer to 116/76; it has also been 115-120 systolic at other clinic visits. Lately she has not had much formal exercise. She had been using the exercise bike during physical therapy without anginal symptoms. Recently she underwent cataract surgery and is now able to see much better out of her right eye. She confirms that she previously had a loop recorder for about a year, subsequently removed. She denies any palpitations, chest pain, shortness of breath, peripheral edema, lightheadedness, headaches, syncope, orthopnea, or PND.  ROS:  Please see the history of present illness. All other systems are reviewed and negative.  (+) Muscle weakness of hands  Studies Reviewed: .        Echo  2022/08/30:  1. Left ventricular ejection fraction, by estimation, is 65 to 70%. Left  ventricular ejection fraction by 3D volume is 68 %. The left ventricle has  normal function. The left ventricle has no regional wall motion  abnormalities. There is moderate  asymmetric left ventricular hypertrophy of the basal-septal segment (14  mm). Hypertrophied papillary muscles. Indeterminate diastolic filling due  to E-A fusion.   2. Right ventricular systolic function is normal. The right ventricular  size is normal.   3. A small pericardial effusion is present. The pericardial effusion is  anterior to the right ventricle.   4. The mitral valve is normal in structure. Trivial mitral valve  regurgitation. No  evidence of mitral stenosis.   5. The aortic valve is tricuspid. Aortic valve regurgitation is not  visualized. No aortic stenosis is present.   Conclusion(s)/Recommendation(s): No intracardiac source of embolism  detected on this transthoracic study. Consider a transesophageal  echocardiogram to exclude cardiac source of embolism if clinically  indicated.   Risk Assessment/Calculations:             Physical Exam:    VS:  BP 124/68 (BP Location: Left Arm, Patient Position: Sitting, Cuff Size: Normal)   Pulse 72   Ht 5\' 2"  (1.575 m)   Wt 140 lb 9.6 oz (63.8 kg)   LMP 05/22/2018 (Within Days)   SpO2 97%   BMI 25.72 kg/m  , BMI Body mass index is 25.72 kg/m. GENERAL:  Well appearing HEENT: Pupils equal round and reactive, fundi not visualized, oral mucosa unremarkable NECK:  No jugular venous distention, waveform within normal limits, carotid upstroke brisk and symmetric, no bruits, no thyromegaly LUNGS:  Clear to auscultation bilaterally HEART:  RRR.  PMI not displaced or sustained,S1 and S2 within normal limits, no S3, no S4, no clicks, no rubs, no murmurs  ABD:  Flat, positive bowel sounds normal in frequency in pitch, no bruits, no rebound, no guarding, no midline pulsatile mass, no hepatomegaly, no splenomegaly EXT:  2 plus pulses throughout, no edema, no cyanosis no clubbing SKIN:  No rashes no nodules NEURO:  Cranial nerves II through XII grossly intact, motor grossly intact throughout PSYCH:  Cognitively intact, oriented to person place and time  Wt Readings from Last 3 Encounters:  04/02/23 140 lb 9.6 oz (63.8 kg)  01/04/23 137 lb 12.8 oz (62.5 kg)  10/28/22 139 lb 5.3 oz (63.2 kg)     ASSESSMENT AND PLAN: .    # Recurrent Strokes Multiple strokes since 2016 with no clear etiology despite extensive workup including transesophageal echocardiogram and loop recorder monitoring. She continues to struggle with R hand weakness that makes it hard for her to write.  She  continues with PT. -Continue current regimen of dual antiplatelet therapy with aspirin and ticagrelor indefinitely.  # Hypertension Well controlled with current regimen of amlodipine and carvedilol. -Continue amlodipine and carvedilol.  # Hyperlipidemia Well controlled with atorvastatin. -Continue atorvastatin.   # Moderate LVH:  Asymmetric LVH with septum measuring 1.4cm.  She has no heart failure symptoms or exertional dyspnea.  BP now finally well-controlled.  No other echo findings consistent with HCM.  Continue carvedilol and BP control.   # CKD IV:  Continue nephrology follow up and BP control as above.   Physical Limitations Limited exercise capacity due to physical limitations. -Continue physical therapy as tolerated.  Follow-up in 6 months or sooner if needed.    Addendum:  Given LVH, bilateral carpel tunnel, CKD and pericardial effusion, will get PYP scan to rule out amyloidosis.      Dispo:  FU with Sabine Tenenbaum C. Duke Salvia, MD, [facility] in 6 months.  I,Mathew Stumpf,acting as a Neurosurgeon for Christina Si, MD.,have documented all relevant documentation on the behalf of Christina Si, MD,as directed by  Christina Si, MD while in the presence of Christina Si, MD.  I, Shamanda Len C. Duke Salvia, MD have reviewed all documentation for this visit.  The documentation of the exam, diagnosis, procedures, and orders on 04/02/2023 are all accurate and complete.   Signed, Carlena Bjornstad

## 2023-04-03 ENCOUNTER — Encounter (HOSPITAL_COMMUNITY): Payer: Self-pay

## 2023-04-03 ENCOUNTER — Ambulatory Visit: Payer: 59

## 2023-04-03 DIAGNOSIS — R471 Dysarthria and anarthria: Secondary | ICD-10-CM | POA: Diagnosis not present

## 2023-04-03 DIAGNOSIS — R41841 Cognitive communication deficit: Secondary | ICD-10-CM

## 2023-04-03 DIAGNOSIS — R4701 Aphasia: Secondary | ICD-10-CM

## 2023-04-05 ENCOUNTER — Ambulatory Visit: Payer: 59 | Admitting: Occupational Therapy

## 2023-04-05 ENCOUNTER — Ambulatory Visit: Payer: 59 | Admitting: Speech Pathology

## 2023-04-05 DIAGNOSIS — R471 Dysarthria and anarthria: Secondary | ICD-10-CM | POA: Diagnosis not present

## 2023-04-05 DIAGNOSIS — R41842 Visuospatial deficit: Secondary | ICD-10-CM

## 2023-04-05 DIAGNOSIS — R278 Other lack of coordination: Secondary | ICD-10-CM

## 2023-04-05 DIAGNOSIS — R41844 Frontal lobe and executive function deficit: Secondary | ICD-10-CM

## 2023-04-05 DIAGNOSIS — R41840 Attention and concentration deficit: Secondary | ICD-10-CM

## 2023-04-05 DIAGNOSIS — R29818 Other symptoms and signs involving the nervous system: Secondary | ICD-10-CM

## 2023-04-05 DIAGNOSIS — M6281 Muscle weakness (generalized): Secondary | ICD-10-CM

## 2023-04-05 NOTE — Patient Instructions (Signed)
*  Avoid sharp pain *Twist with both hands    *Avoid sharp pain *Make "O" shape - thumb, pointer finger, middle finger, pinky finger   *Set up with both hands *Straighten fingers on right hand

## 2023-04-05 NOTE — Therapy (Addendum)
OUTPATIENT OCCUPATIONAL THERAPY Treatment  Patient Name: Christina Orozco MRN: [id number] DOB:1971-01-23, 52 y.o., female Today's Date: 04/05/2023  PCP: Kerin Salen, PA-C REFERRING PROVIDER: Kerin Salen, PA-C  END OF SESSION:  OT End of Session - 04/05/23 1616     Visit Number 2    Number of Visits 16   + evaluation   Date for OT Re-Evaluation 05/25/23    Authorization Type UHC Dual Complete Covered 100% VL: Follows Medicare guidelines    Progress Note Due on Visit 10    OT Start Time 0330    OT Stop Time 0413    OT Time Calculation (min) 43 min    Activity Tolerance Patient tolerated treatment well    Behavior During Therapy WFL for tasks assessed/performed              Past Medical History:  Diagnosis Date   Anemia    severe   Anxiety    Asthma    CKD (chronic kidney disease)    stage IV   DDD (degenerative disc disease), cervical    Depression    Diabetes mellitus (HCC)    Dislocated shoulder    right   GERD (gastroesophageal reflux disease)    Headache    migraines (about once a month)   History of kidney stones    Hypertension    Neuropathy    Peripheral vascular disease (HCC)    Pneumonia    Stroke [facility])    Past Surgical History:  Procedure Laterality Date   ADENOIDECTOMY     APPENDECTOMY     CERVICAL SPINE SURGERY  06/2012   C5-C6   EYE SURGERY     left eye surgery    FOOT SURGERY     KENALOG INJECTION Left 03/10/2021   Procedure: Intravitreal Avastin Injection;  Surgeon: Stephannie Li, MD;  Location: [facility] OR;  Service: Ophthalmology;  Laterality: Left;   LOOP RECORDER INSERTION N/A 08/27/2017   Procedure: LOOP RECORDER INSERTION;  Surgeon: Thurmon Fair, MD;  Location: [facility] INVASIVE CV LAB;  Service: Cardiovascular;  Laterality: N/A;   MEMBRANE PEEL Left 03/10/2021   Procedure: MEMBRANE PEEL;  Surgeon: Stephannie Li, MD;  Location: [facility] OR;  Service: Ophthalmology;  Laterality: Left;   PARS PLANA VITRECTOMY Left  03/10/2021   Procedure: TWENTY-FIVE GAUGE PARS PLANA VITRECTOMY;  Surgeon: Stephannie Li, MD;  Location: [facility] OR;  Service: Ophthalmology;  Laterality: Left;   PHOTOCOAGULATION WITH LASER Left 03/10/2021   Procedure: PHOTOCOAGULATION WITH LASER;  Surgeon: Stephannie Li, MD;  Location: [facility] OR;  Service: Ophthalmology;  Laterality: Left;   SHOULDER SURGERY     TEE WITHOUT CARDIOVERSION N/A 08/27/2017   Procedure: TRANSESOPHAGEAL ECHOCARDIOGRAM (TEE);  Surgeon: Thurmon Fair, MD;  Location: [facility] ENDOSCOPY;  Service: Cardiovascular;  Laterality: N/A;   TONSILLECTOMY     Patient Active Problem List   Diagnosis Date Noted   Diplopia 10/31/2022   OSA (obstructive sleep apnea) 08/25/2022   Subcortical infarction (HCC) 08/23/2022   Overweight (BMI 25.0-29.9) 08/23/2022   Asthma, chronic 08/22/2022   TIA (transient ischemic attack) 06/20/2022   Numbness and tingling in left hand 03/02/2022   Gait disturbance, post-stroke 03/02/2022   AKI (acute kidney injury) (HCC) 12/23/2021   Acute embolic stroke (HCC) 12/23/2021   Hypernatremia 12/20/2021   Chronic kidney disease (CKD), stage IV (severe) (HCC) 12/19/2021   Left-sided weakness and visual deficits  12/19/2021   Vision abnormalities 12/19/2021   Acute stroke due to ischemia [facility]) 11/10/2021   Depression 07/05/2020  CVA (cerebral vascular accident) (HCC) 06/21/2020   Generalized anxiety disorder 03/01/2019   Abnormality of gait 10/14/2018   Dizziness and giddiness 09/13/2018   Debility 06/26/2018   Chronic nonintractable headache 04/26/2018   Diastolic dysfunction    Congestion of nasal sinus    Pneumonia of left lower lobe due to infectious organism    SOB (shortness of breath)    Candidiasis    Anemia    Hypoalbuminemia due to protein-calorie malnutrition (HCC)    Hypertension    Type 2 diabetes mellitus with chronic kidney disease, without long-term current use of insulin (HCC)    PRES (posterior reversible encephalopathy syndrome)     Hypokalemia    History of CVA with residual deficit    History of seizure 03/25/2018   Bilateral carpal tunnel syndrome 02/14/2018   Arthritis 10/31/2017   Asthma 10/31/2017   Constipation 10/31/2017   Kidney stone 10/31/2017   Anemia, chronic disease 08/09/2017   Hyperlipidemia 07/26/2017   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HCC) 07/25/2017   Anxiety as acute reaction to exceptional stress 04/23/2017   Insomnia 10/08/2016   Neuropathy 06/07/2016   Gait instability    Lesion of pons    Slurred speech    Vertigo 04/30/2016   Hyperglycemia 04/30/2016   Chronic kidney disease (CKD), stage III (moderate) (HCC) 04/30/2016   Ataxia 04/30/2016   Chronic right shoulder pain 04/03/2016   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   Rhinitis, allergic 08/25/2015   Asthma, mild intermittent 07/14/2015   Gastroesophageal reflux disease without esophagitis 06/08/2015   Hypertensive cardiovascular disease 05/05/2015   Vitamin D deficiency 05/05/2015    ONSET DATE: 03/06/2023  REFERRING DIAG: I63.9 (ICD-10-CM) - Recurrent strokes  THERAPY DIAG:  Other lack of coordination  Muscle weakness (generalized)  Visuospatial deficit  Attention and concentration deficit  Frontal lobe and executive function deficit  Other symptoms and signs involving the nervous system  Rationale for Evaluation and Treatment: Rehabilitation  SUBJECTIVE:   SUBJECTIVE STATEMENT:  Pt reported new glasses will arrive on 04/20/23.  Pt reported being the same as last week. Pt reported continuing to practice handwriting.   Pt reported smelling cigarette smoke when she was taking bus today, and she stated this was significant because she smelled cigarette smoke just before her most recent stroke. When OT asked if pt currently smelled cigarette smoke, pt reported "No, I stopped smelling smoke before I got inside the building." Pt denied symptoms of dizziness. Pt reported already reaching out to neurologist. Pt confirmed she  currently felt "okay."  Pt accompanied by: self  PERTINENT HISTORY:  PMHx: Hypertension, OSA, severe diabetes, recurrent stroke (ILR placed 2019 removed 07/2019 after no atrial arrhythmias noted), seizure, CKD IV, asthma, vertigo.   Pt had issues with LE which she reports has been determined to be either a cyst or a lypoma.  PRECAUTIONS: None  WEIGHT BEARING RESTRICTIONS: No   PAIN:  Are you having pain? No and Yes: NPRS scale: 5/10 Pain location: L shoulder and neck, pt reports torn L rotator cuff Pain description: throbbing and aching Aggravating factors: pt reports "it is always there" Relieving factors: "nothing"  FALLS: Has patient fallen in last 6 months? Yes. Number of falls multiple She fell out of the car at the store, at home in the bathroom trying to reach the railing etc But pt reports no EMT assistance required for any of her falls. April x2 May x1 June x1 July x2 August/September 0  LIVING ENVIRONMENT: Lives with: lives alone - since August.  Her daughter went back to school and they aren't speaking.  Her brother lives nearby but is only able to help about 1x/week Lives in: handicap accessible apartment Stairs: No Has following equipment at home: Single point cane, Environmental consultant - 2 wheeled, Environmental consultant - 4 wheeled, shower chair, bed side commode, and Grab bars  PLOF: Independent about 1 year ago but most recently has had increased help.  She currently - does not drive anymore.  Her brother writes her messages ie) about how long to cook things in the microwave etc.   PATIENT GOALS: Improve RUE coordination for handwriting, cooking and managing buttons.   OBJECTIVE:  Note: Objective measures were completed at Evaluation unless otherwise noted.  HAND DOMINANCE: Right  ADLs: Overall ADLs: Ind but with difficulty.  Doesn't wear things with buttons or don socks very often. Transfers/ambulation related to ADLs: Eating: Ind Grooming: Ind UB Dressing: Ind - only wears pull  on clothes LB Dressing: Ind Toileting: Ind Bathing: Mod I - tries to shower daily. Tub Shower transfers: Mod I  Equipment: Shower seat without back, Grab bars, bed side commode, and Reacher  IADLs: Shopping: uses power wheelchair at stores to get around; uses public GTA transportation [employer]: Not doing sweeping etc but is working on [employer] Prep: Hard - mostly microwave meals/premade Education officer, community. Community mobility: Mod I with GTA and rollator Medication management: Meds come in bubble packs Financial management: Ind - pays rent online Handwriting: 50% legible - tearful  MOBILITY STATUS: Independent  POSTURE COMMENTS: rollator rounded shoulders and forward head Sitting balance: WFL  ACTIVITY TOLERANCE: Activity tolerance: Fair  FUNCTIONAL OUTCOME MEASURES: Patient Specific Functional Scale: 1.7   UPPER EXTREMITY ROM:    Active ROM Right eval Left eval  Shoulder flexion WFL but slow WNL  Shoulder abduction    Shoulder adduction    Shoulder extension    Shoulder internal rotation    Shoulder external rotation    Elbow flexion    Elbow extension    Wrist flexion    Wrist extension    Wrist ulnar deviation    Wrist radial deviation    Wrist pronation    Wrist supination    (Blank rows = not tested)  UPPER EXTREMITY MMT:     MMT Right eval Left eval  Shoulder flexion 3/5 3+/5  Shoulder abduction    Shoulder adduction    Shoulder extension    Shoulder internal rotation    Shoulder external rotation    Middle trapezius    Lower trapezius    Elbow flexion    Elbow extension    Wrist flexion    Wrist extension    Wrist ulnar deviation    Wrist radial deviation    Wrist pronation    Wrist supination    (Blank rows = not tested)  HAND FUNCTION: Grip strength: Right: 52.9, 49.8, 45.6 lbs; Left: 52.9, 54.2, 55.1 lbs Average: Right: 49.4 lbs Left: 54.1 lbs  COORDINATION: 9 Hole Peg test: Right: 53.17 sec; Left: 48.01  sec  SENSATION: WFL  EDEMA: NA  MUSCLE TONE: Atypical - OTR noted some jitteriness with slight shaking in R>LUE, possibly due to generalized weakness   COGNITION: Overall cognitive status: Impaired - increased memory issues since last onset of issues (Pt in Speech Therapy)  VISION: Subjective report: Recent cataract surgery - awaiting new prescription at next appt Baseline vision: Bifocals Visual history: cataracts, retinopathy, and diabetic  VISION ASSESSMENT: Not tested  Patient has difficulty with following activities  due to following visual impairments: Reading, driving   PERCEPTION: Not tested  PRAXIS: Not tested  OBSERVATIONS: Pt ambulates with rollator with careful maneuvering around chair to sit safely.  She had no loss of balance but did slide into the chair. The pt appears well kept and had socks and shoes donned today which is reports is a first in recent history as socks are hard to get on herself.   TODAY'S TREATMENT:                                                                                                                              DATE:  Neuro Re-Ed OT initiated theraputty exercises (pink) - to increase RUE strengthening, coordination, proprioception, sequencing for unfamiliar motor tasks. - Pt demo'd understanding, benefited from increased time for processing motor movements and v/c and modeling to improve efficiency.  Access Code: [email or web address] URL: [email or web address] Date: 04/05/2023 Prepared by: Carilyn Goodpasture  Exercises - Putty Squeezes  - 2 x daily - 10 reps - Revised to "twist with both hands" d/t pain. Pt grimacing after 2 reps of putty squeezes with RUE and pt reporting 8 out of 10 pain at R anterior forearm (flexors). Pt denied pain when twisting putty with both hands.  - Tip Pinch with Putty  - 2 x daily  - 10 reps - Revised to alternating fingers (thumb to pointer, middle, pinky fingers). Ring finger excluded d/t pt reporting  increased sharp pain after 2 reps.  - Rolling Putty on Table  - 2 x daily - 10 reps  - Finger Extension with Putty  - 2 x daily - 10 reps   Therapeutic Activities Handwriting - to improve ind with ADL/IADL tasks involving writing, to improve legibility for handwriting - Pt reports previously writing checks, grocery lists, and journaling though now experiencing difficulty with handwriting. Therefore, OT educated on several adaptive handwriting utensils. Pt verbalized understanding. Pt trialed pen with cushion (100% legibility print and cursive on lined paper), twist n' write pen (approx. 90% legibility on lined paper), and pen with tripod grip (100% legibility on lined paper, approx. 60% legibility on unlined paper). Pt verbalized preference for tripod grip. OT provided pencil grip to pt and educated on using lined paper to improve legibility. Pt initially appeared upset during handwriting task saying "This is not my handwriting." Therefore, OT provided verbal encouragement and educated on importance of legibility and practice. Pt verbalized understanding and seemed more confident at end of writing task with tripod grip. OT noted pt required increased time for handwriting and appeared cautious when writing.   PATIENT EDUCATION: Education details: HEP update, expectations for handwriting after strokes Person educated: Patient Education method: Explanation, Demonstration, Verbal cues, and Handouts Education comprehension: verbalized understanding, returned demonstration, verbal cues required, and needs further education  HOME EXERCISE PROGRAM: 04/05/23 - theraputty (pink) - Access Code: [email or web address]   GOALS: Goals reviewed with patient? Yes  SHORT TERM GOALS: Target date: 04/27/23  Patient  will demonstrate updated B HEP with 25% verbal cues or less for proper execution. Baseline: New to outpt OT Goal status: INITIAL  2.  Patient will demonstrate at least 50+ lbs R UE grip strength as needed to  open jars and other containers. Baseline: 49.4 lbs with consistently lower strength x 3 trials. Goal status: INITIAL  3.  Patient will complete nine-hole peg with use of BUE in 45 seconds or less.  Baseline: Right: 53.17 sec; Left: 48.01 sec Goal status: INITIAL  4.  Patient will be able to use adaptive equipment/strategies to manage buttons, socks and shoes. Baseline: Avoids clothing with buttons - dresses with pullovers and rarely wears socks Goal status: INITIAL   LONG TERM GOALS: Target date: 05/25/23  Patient will demonstrate updated B HEP with visual handouts only for proper execution. Baseline: New to outpt OT Goal status: INITIAL  2.  Patient will write a sentence, list or check and maintain 75% legibility. Baseline: 50% with large font and emotional/frustrated Goal status: INITIAL  3.  Patient will have ideas re: AE/appliances and recipes for appropriate food options for increased variety of meals ie) Microwave/Mug meals, etc. Baseline: Pt uses microwave with pre made/frozen meals. Goal status: INITIAL  4.  Patient will demonstrate improvement with cooking, buttons and handwriting, indicating improved functional use of affected extremity for PSFS score of 5.0 +. Baseline: Patient Specific Functional Scale: 1.7 Goal status: INITIAL  ASSESSMENT:  CLINICAL IMPRESSION: Pt tolerated tasks fairly well today. Pt would benefit from skilled OT services in the outpatient setting to work on impairments as noted below to help pt return to PLOF as able.     PERFORMANCE DEFICITS: in functional skills including ADLs, IADLs, coordination, dexterity, strength, Fine motor control, Gross motor control, balance, decreased knowledge of precautions, decreased knowledge of use of DME, vision, and UE functional use, cognitive skills including attention, emotional, memory, and problem solving, and psychosocial skills including coping strategies, environmental adaptation, interpersonal  interactions, and routines and behaviors.   IMPAIRMENTS: are limiting patient from ADLs, IADLs, work, and leisure.   CO-MORBIDITIES: has co-morbidities such as recent cataract surgery  that affects occupational performance. Patient will benefit from skilled OT to address above impairments and improve overall function.  MODIFICATION OR ASSISTANCE TO COMPLETE EVALUATION: No modification of tasks or assist necessary to complete an evaluation.  OT OCCUPATIONAL PROFILE AND HISTORY: Problem focused assessment: Including review of records relating to presenting problem.  CLINICAL DECISION MAKING: LOW - limited treatment options, no task modification necessary  REHAB POTENTIAL: Good  EVALUATION COMPLEXITY: Low    PLAN:  OT FREQUENCY: 1-2x/week  OT DURATION: 8 weeks  PLANNED INTERVENTIONS: 97535 self care/ADL training, 04540 therapeutic exercise, 97530 therapeutic activity, 97112 neuromuscular re-education, 97113 aquatic therapy, [id number] Cognitive training (first 15 min), 98119 Cognitive training(each additional 15 min), balance training, visual/perceptual remediation/compensation, psychosocial skills training, energy conservation, coping strategies training, patient/family education, and DME and/or AE instructions  RECOMMENDED OTHER SERVICES: Pt planning to get new referral to PT.  Currently receiving ST services.  Pt hoping to get home health services.  CONSULTED AND AGREED WITH PLAN OF CARE: Patient  PLAN FOR NEXT SESSION:  Update putty HEP PRN Initiate coordination HEP  Coordination/writing - check in about tripod pencil grip AE considerations for ADLs/IADLS Further vision evaluation once new glasses are received.   Wynetta Emery, OT 04/05/2023, 4:21 PM

## 2023-04-05 NOTE — Therapy (Signed)
OUTPATIENT SPEECH LANGUAGE PATHOLOGY TREATMENT   Patient Name: Christina Orozco MRN: [id number] DOB:Jul 06, 1970, 52 y.o., female Today's Date: 04/05/2023  PCP: Kerin Salen, PA-C REFERRING PROVIDER: Kerin Salen, PA-C  END OF SESSION:  End of Session - 04/05/23 1454     Visit Number 6    Number of Visits 17    Date for SLP Re-Evaluation 04/30/23    Authorization Type [employer] Dual Complete    Progress Note Due on Visit 10    Activity Tolerance Patient tolerated treatment well                Past Medical History:  Diagnosis Date   Anemia    severe   Anxiety    Asthma    CKD (chronic kidney disease)    stage IV   DDD (degenerative disc disease), cervical    Depression    Diabetes mellitus (HCC)    Dislocated shoulder    right   GERD (gastroesophageal reflux disease)    Headache    migraines (about once a month)   History of kidney stones    Hypertension    Neuropathy    Peripheral vascular disease (HCC)    Pneumonia    Stroke [facility])    Past Surgical History:  Procedure Laterality Date   ADENOIDECTOMY     APPENDECTOMY     CERVICAL SPINE SURGERY  06/2012   C5-C6   EYE SURGERY     left eye surgery    FOOT SURGERY     KENALOG INJECTION Left 03/10/2021   Procedure: Intravitreal Avastin Injection;  Surgeon: Stephannie Li, MD;  Location: [facility] OR;  Service: Ophthalmology;  Laterality: Left;   LOOP RECORDER INSERTION N/A 08/27/2017   Procedure: LOOP RECORDER INSERTION;  Surgeon: Thurmon Fair, MD;  Location: [facility] INVASIVE CV LAB;  Service: Cardiovascular;  Laterality: N/A;   MEMBRANE PEEL Left 03/10/2021   Procedure: MEMBRANE PEEL;  Surgeon: Stephannie Li, MD;  Location: [facility] OR;  Service: Ophthalmology;  Laterality: Left;   PARS PLANA VITRECTOMY Left 03/10/2021   Procedure: TWENTY-FIVE GAUGE PARS PLANA VITRECTOMY;  Surgeon: Stephannie Li, MD;  Location: [facility] OR;  Service: Ophthalmology;  Laterality: Left;   PHOTOCOAGULATION WITH LASER Left  03/10/2021   Procedure: PHOTOCOAGULATION WITH LASER;  Surgeon: Stephannie Li, MD;  Location: [facility] OR;  Service: Ophthalmology;  Laterality: Left;   SHOULDER SURGERY     TEE WITHOUT CARDIOVERSION N/A 08/27/2017   Procedure: TRANSESOPHAGEAL ECHOCARDIOGRAM (TEE);  Surgeon: Thurmon Fair, MD;  Location: [facility] ENDOSCOPY;  Service: Cardiovascular;  Laterality: N/A;   TONSILLECTOMY     Patient Active Problem List   Diagnosis Date Noted   Diplopia 10/31/2022   OSA (obstructive sleep apnea) 08/25/2022   Subcortical infarction (HCC) 08/23/2022   Overweight (BMI 25.0-29.9) 08/23/2022   Asthma, chronic 08/22/2022   TIA (transient ischemic attack) 06/20/2022   Numbness and tingling in left hand 03/02/2022   Gait disturbance, post-stroke 03/02/2022   AKI (acute kidney injury) (HCC) 12/23/2021   Acute embolic stroke (HCC) 12/23/2021   Hypernatremia 12/20/2021   Chronic kidney disease (CKD), stage IV (severe) (HCC) 12/19/2021   Left-sided weakness and visual deficits  12/19/2021   Vision abnormalities 12/19/2021   Acute stroke due to ischemia [facility]) 11/10/2021   Depression 07/05/2020   CVA (cerebral vascular accident) (HCC) 06/21/2020   Generalized anxiety disorder 03/01/2019   Abnormality of gait 10/14/2018   Dizziness and giddiness 09/13/2018   Debility 06/26/2018   Chronic nonintractable headache 04/26/2018   Diastolic dysfunction  Congestion of nasal sinus    Pneumonia of left lower lobe due to infectious organism    SOB (shortness of breath)    Candidiasis    Anemia    Hypoalbuminemia due to protein-calorie malnutrition (HCC)    Hypertension    Type 2 diabetes mellitus with chronic kidney disease, without long-term current use of insulin (HCC)    PRES (posterior reversible encephalopathy syndrome)    Hypokalemia    History of CVA with residual deficit    History of seizure 03/25/2018   Bilateral carpal tunnel syndrome 02/14/2018   Arthritis 10/31/2017   Asthma 10/31/2017    Constipation 10/31/2017   Kidney stone 10/31/2017   Anemia, chronic disease 08/09/2017   Hyperlipidemia 07/26/2017   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HCC) 07/25/2017   Anxiety as acute reaction to exceptional stress 04/23/2017   Insomnia 10/08/2016   Neuropathy 06/07/2016   Gait instability    Lesion of pons    Slurred speech    Vertigo 04/30/2016   Hyperglycemia 04/30/2016   Chronic kidney disease (CKD), stage III (moderate) (HCC) 04/30/2016   Ataxia 04/30/2016   Chronic right shoulder pain 04/03/2016   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  Pt accompanied by: self  PERTINENT HISTORY:  recurrent stroke (ILR placed 2019 removed 07/2019 after no atrial arrhythmias noted), seizure, vertigo. Scheduled to have cataract surgery in October   PAIN: Are you having pain? No  FALLS: Has patient fallen in last 6 months?  Yes, See PT evaluation for details  LIVING ENVIRONMENT: Lives with: lives alone Lives in: House/apartment  PLOF:  Level of assistance: Needed assistance with ADLs, Needed assistance with IADLS Employment: On disability  PATIENT GOALS: "To talk. I want my mom to stop correcting me (when I talk)"  OBJECTIVE:   TODAY'S TREATMENT:                                                                                                                                         04/05/23: Pt with teach back of 4/4 dysarthria strategies. SLP reviewed  recommendation for voice projection to aid in improved vocal quality. Use of visual aid with min cues to reference to carryover in conversational task. 100% intelligible. Plan to d/c next session, pt in agreement.   04/03/23: Maintained 100% intelligibility in structured task and extended conversation today, with rare self-corrections of speech errors required. Observed intermittent breathy, pressed voice today, in which SLP cued increased breath support and projection  which aided vocal clarity in conversation. Continue to target in upcoming sessions if observed.    03/29/23: Reviewed dysarthria strategies. Pt noted positive affirmation of improving speech from mother. Addressed dysarthria in discourse level language task in which pt summarized 5 minute video stimuli. Pt achieved 100% intelligibility with no cues and occasional self-corrections of mispronounced words.   03/27/23: SLP introduced anomia strategies to aid in recovery when word finding difficulties present. See pt instructions. Practiced description strategy with pt able to fully describe x1 target with occasional min-A. Reviewed dysarthria strategies addressed last session. SLP led pt through structured practice of employment of dysarthria and anomia strategies in picture description task. Pt presenting with improved articulation in structured task vs unstructured conversation following. However, pt evidences 100% intelligibility without demonstration of strategies. Pt supports she was not utilizing strategies in unstructured conversation. Corrected articulation errors x3 in today's session.  03-12-23: SLP educated pt on dysarthria strategies to increase intelligibility. Targeted in structured naming and description activity. When utilizing strategies, pt increased to 95% intelligibility given occasional mod A. Benefited from cues to think of what she was going to say first, slow down, and over-enunciate. Experienced one instance of imprecise  articulation which led to increased frustration. Pt successful with word once given visual cue and SLP model of breaking word into syllables. Practiced dysarthria strategies in structured activity targeting 3-4 syllable words. Pt achieved 100% intelligibility given occasional mod A with previously mentioned cues.  03-05-23: Eval only    PATIENT EDUCATION: Education details: see above Person educated: Patient Education method: Explanation, Demonstration, and Handouts Education comprehension: verbalized understanding, returned demonstration, and needs further education   GOALS: Goals reviewed with patient? Yes  SHORT TERM GOALS: Target date: 04/02/2023  Pt will utilize dysarthria strategies during structured activity to be 100% intelligible with occasional min A  Baseline: Goal status: MET  2.  Pt will demonstrate increased awareness of speech errors with attempts to self-correct for 50% of opportunities in structured activity  Baseline: 03/29/23 Goal status: MET  3.  Pt will utilize word retrieval strategies during structured conversations x2 with occasional min A  Baseline:  Goal status: MET  4.  Pt will self-advocate for additional processing time as needed for 2/2 opportunities without prompting  Baseline:  Goal status: MET  LONG TERM GOALS: Target date: 04/30/2023  Pt will utilize dysarthria strategies during unstructured conversation to be 100% intelligible with rare min A  Baseline: 03/29/23 Goal status: MET  2.  Pt will demonstrate increased awareness of speech errors and self-correct for 80% of opportunities in unstructured conversations x2 Baseline:  Goal status: MET  3.  Pt will utilize word retrieval strategies during unstructured conversations x2 with rare min A  Baseline:  Goal status: MET  4.  Pt will report improved communication effectiveness via PROM by 2 points at LTG date Baseline: CPIB=15 Goal status: IN PROGRESS   ASSESSMENT:  CLINICAL  IMPRESSION: Pt seen today for ST addressing dysarthria. SLP provided review of dysarthria strategies. Rated 100% intelligible this date during structured tasks and in conversational exchange with intermittent cues for projection and increased breath support to optimize vocal quality. Pt endorses improved speech via telephone with mother. Continue to treat per POC, d/c next session.   OBJECTIVE IMPAIRMENTS: include attention, memory, awareness, aphasia, and dysarthria. These impairments are limiting patient from effectively communicating at home and in community. Factors affecting potential to achieve goals and functional outcome are co-morbidities and medical prognosis. Patient will benefit from skilled SLP services  to address above impairments and improve overall function.  REHAB POTENTIAL: Good  PLAN:  SLP FREQUENCY: 1-2x/week  SLP DURATION: 8 weeks  PLANNED INTERVENTIONS: Cueing hierachy, Cognitive reorganization, Internal/external aids, Functional tasks, SLP instruction and feedback, Compensatory strategies, and Patient/family education    Whitney Muse, Student-SLP 04/05/2023, 2:55 PM

## 2023-04-09 ENCOUNTER — Ambulatory Visit: Payer: 59 | Admitting: Occupational Therapy

## 2023-04-09 ENCOUNTER — Ambulatory Visit: Payer: 59

## 2023-04-09 DIAGNOSIS — M6281 Muscle weakness (generalized): Secondary | ICD-10-CM

## 2023-04-09 DIAGNOSIS — R41844 Frontal lobe and executive function deficit: Secondary | ICD-10-CM

## 2023-04-09 DIAGNOSIS — R471 Dysarthria and anarthria: Secondary | ICD-10-CM | POA: Diagnosis not present

## 2023-04-09 DIAGNOSIS — R29818 Other symptoms and signs involving the nervous system: Secondary | ICD-10-CM

## 2023-04-09 DIAGNOSIS — R278 Other lack of coordination: Secondary | ICD-10-CM

## 2023-04-09 DIAGNOSIS — R41842 Visuospatial deficit: Secondary | ICD-10-CM

## 2023-04-09 DIAGNOSIS — R4701 Aphasia: Secondary | ICD-10-CM

## 2023-04-09 NOTE — Therapy (Signed)
OUTPATIENT OCCUPATIONAL THERAPY Treatment  Patient Name: Christina Orozco MRN: [id number] DOB:May 26, 1971, 52 y.o., female Today's Date: 04/09/2023  PCP: Kerin Salen, PA-C REFERRING PROVIDER: Kerin Salen, PA-C  END OF SESSION:  OT End of Session - 04/09/23 1627     Visit Number 3    Number of Visits 16   + evaluation   Date for OT Re-Evaluation 05/25/23    Authorization Type UHC Dual Complete Covered 100% VL: Follows Medicare guidelines    Progress Note Due on Visit 10    OT Start Time 1404    OT Stop Time 1444    OT Time Calculation (min) 40 min    Activity Tolerance Patient tolerated treatment well    Behavior During Therapy WFL for tasks assessed/performed               Past Medical History:  Diagnosis Date   Anemia    severe   Anxiety    Asthma    CKD (chronic kidney disease)    stage IV   DDD (degenerative disc disease), cervical    Depression    Diabetes mellitus (HCC)    Dislocated shoulder    right   GERD (gastroesophageal reflux disease)    Headache    migraines (about once a month)   History of kidney stones    Hypertension    Neuropathy    Peripheral vascular disease (HCC)    Pneumonia    Stroke [facility])    Past Surgical History:  Procedure Laterality Date   ADENOIDECTOMY     APPENDECTOMY     CERVICAL SPINE SURGERY  06/2012   C5-C6   EYE SURGERY     left eye surgery    FOOT SURGERY     KENALOG INJECTION Left 03/10/2021   Procedure: Intravitreal Avastin Injection;  Surgeon: Stephannie Li, MD;  Location: [facility] OR;  Service: Ophthalmology;  Laterality: Left;   LOOP RECORDER INSERTION N/A 08/27/2017   Procedure: LOOP RECORDER INSERTION;  Surgeon: Thurmon Fair, MD;  Location: [facility] INVASIVE CV LAB;  Service: Cardiovascular;  Laterality: N/A;   MEMBRANE PEEL Left 03/10/2021   Procedure: MEMBRANE PEEL;  Surgeon: Stephannie Li, MD;  Location: [facility] OR;  Service: Ophthalmology;  Laterality: Left;   PARS PLANA VITRECTOMY Left  03/10/2021   Procedure: TWENTY-FIVE GAUGE PARS PLANA VITRECTOMY;  Surgeon: Stephannie Li, MD;  Location: [facility] OR;  Service: Ophthalmology;  Laterality: Left;   PHOTOCOAGULATION WITH LASER Left 03/10/2021   Procedure: PHOTOCOAGULATION WITH LASER;  Surgeon: Stephannie Li, MD;  Location: [facility] OR;  Service: Ophthalmology;  Laterality: Left;   SHOULDER SURGERY     TEE WITHOUT CARDIOVERSION N/A 08/27/2017   Procedure: TRANSESOPHAGEAL ECHOCARDIOGRAM (TEE);  Surgeon: Thurmon Fair, MD;  Location: [facility] ENDOSCOPY;  Service: Cardiovascular;  Laterality: N/A;   TONSILLECTOMY     Patient Active Problem List   Diagnosis Date Noted   Diplopia 10/31/2022   OSA (obstructive sleep apnea) 08/25/2022   Subcortical infarction (HCC) 08/23/2022   Overweight (BMI 25.0-29.9) 08/23/2022   Asthma, chronic 08/22/2022   TIA (transient ischemic attack) 06/20/2022   Numbness and tingling in left hand 03/02/2022   Gait disturbance, post-stroke 03/02/2022   AKI (acute kidney injury) (HCC) 12/23/2021   Acute embolic stroke (HCC) 12/23/2021   Hypernatremia 12/20/2021   Chronic kidney disease (CKD), stage IV (severe) (HCC) 12/19/2021   Left-sided weakness and visual deficits  12/19/2021   Vision abnormalities 12/19/2021   Acute stroke due to ischemia [facility]) 11/10/2021   Depression 07/05/2020  CVA (cerebral vascular accident) (HCC) 06/21/2020   Generalized anxiety disorder 03/01/2019   Abnormality of gait 10/14/2018   Dizziness and giddiness 09/13/2018   Debility 06/26/2018   Chronic nonintractable headache 04/26/2018   Diastolic dysfunction    Congestion of nasal sinus    Pneumonia of left lower lobe due to infectious organism    SOB (shortness of breath)    Candidiasis    Anemia    Hypoalbuminemia due to protein-calorie malnutrition (HCC)    Hypertension    Type 2 diabetes mellitus with chronic kidney disease, without long-term current use of insulin (HCC)    PRES (posterior reversible encephalopathy syndrome)     Hypokalemia    History of CVA with residual deficit    History of seizure 03/25/2018   Bilateral carpal tunnel syndrome 02/14/2018   Arthritis 10/31/2017   Asthma 10/31/2017   Constipation 10/31/2017   Kidney stone 10/31/2017   Anemia, chronic disease 08/09/2017   Hyperlipidemia 07/26/2017   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HCC) 07/25/2017   Anxiety as acute reaction to exceptional stress 04/23/2017   Insomnia 10/08/2016   Neuropathy 06/07/2016   Gait instability    Lesion of pons    Slurred speech    Vertigo 04/30/2016   Hyperglycemia 04/30/2016   Chronic kidney disease (CKD), stage III (moderate) (HCC) 04/30/2016   Ataxia 04/30/2016   Chronic right shoulder pain 04/03/2016   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   Rhinitis, allergic 08/25/2015   Asthma, mild intermittent 07/14/2015   Gastroesophageal reflux disease without esophagitis 06/08/2015   Hypertensive cardiovascular disease 05/05/2015   Vitamin D deficiency 05/05/2015    ONSET DATE: 03/06/2023  REFERRING DIAG: I63.9 (ICD-10-CM) - Recurrent strokes  THERAPY DIAG:  Other lack of coordination  Muscle weakness (generalized)  Visuospatial deficit  Frontal lobe and executive function deficit  Other symptoms and signs involving the nervous system  Rationale for Evaluation and Treatment: Rehabilitation  SUBJECTIVE:   SUBJECTIVE STATEMENT:  Pt reported new glasses will arrive on 04/20/23.  Pt reports recent fall. Pt reports handwriting "is there but not the way I like it." Pt reports using tripod pencil grip and enjoys using it during handwriting tasks. Pt reports completing theraputty HEP. Pt reports theraputty is sometimes "confusing" to remember which fingers to use during tip pinch exercise. Pt politely declined to review theraputty HEP today stating it simply takes extra time to remember the sequence.   Pt accompanied by: self  PERTINENT HISTORY:  PMHx: Hypertension, OSA, severe diabetes, recurrent stroke (ILR  placed 2019 removed 07/2019 after no atrial arrhythmias noted), seizure, CKD IV, asthma, vertigo.   Pt had issues with LE which she reports has been determined to be either a cyst or a lypoma.  PRECAUTIONS: None  WEIGHT BEARING RESTRICTIONS: No   PAIN:  Are you having pain? No and Yes: NPRS scale: 5/10 Pain location: L shoulder and neck Pain description: stabbing Aggravating factors: pt reports "it is always there" Relieving factors: "nothing"  FALLS: Has patient fallen in last 6 months? Yes. Number of falls multiple She fell out of the car at the store, at home in the bathroom trying to reach the railing etc But pt reports no EMT assistance required for any of her falls. April x2 May x1 June x1 July x2 August/September 0   04/09/23 update - Pt reports falling in kitchen on Friday 04/06/23 d/t pt attempting to sit down and "I just missed the whole chair." Pt reported attempting to sit on rolling chair.  LIVING ENVIRONMENT: Lives  with: lives alone - since August.  Her daughter went back to school and they aren't speaking.  Her brother lives nearby but is only able to help about 1x/week Lives in: handicap accessible apartment Stairs: No Has following equipment at home: Single point cane, Environmental consultant - 2 wheeled, Environmental consultant - 4 wheeled, shower chair, bed side commode, and Grab bars  PLOF: Independent about 1 year ago but most recently has had increased help.  She currently - does not drive anymore.  Her brother writes her messages ie) about how long to cook things in the microwave etc.   PATIENT GOALS: Improve RUE coordination for handwriting, cooking and managing buttons.   OBJECTIVE:  Note: Objective measures were completed at Evaluation unless otherwise noted.  HAND DOMINANCE: Right  ADLs: Overall ADLs: Ind but with difficulty.  Doesn't wear things with buttons or don socks very often. Transfers/ambulation related to ADLs: Eating: Ind Grooming: Ind UB Dressing: Ind - only wears  pull on clothes LB Dressing: Ind Toileting: Ind Bathing: Mod I - tries to shower daily. Tub Shower transfers: Mod I  Equipment: Shower seat without back, Grab bars, bed side commode, and Reacher  IADLs: Shopping: uses power wheelchair at stores to get around; uses public GTA transportation [employer]: Not doing sweeping etc but is working on [employer] Prep: Hard - mostly microwave meals/premade Education officer, community. Community mobility: Mod I with GTA and rollator Medication management: Meds come in bubble packs Financial management: Ind - pays rent online Handwriting: 50% legible - tearful  MOBILITY STATUS: Independent  POSTURE COMMENTS: rollator rounded shoulders and forward head Sitting balance: WFL  ACTIVITY TOLERANCE: Activity tolerance: Fair  FUNCTIONAL OUTCOME MEASURES: Patient Specific Functional Scale: 1.7   UPPER EXTREMITY ROM:    Active ROM Right eval Left eval  Shoulder flexion WFL but slow WNL  Shoulder abduction    Shoulder adduction    Shoulder extension    Shoulder internal rotation    Shoulder external rotation    Elbow flexion    Elbow extension    Wrist flexion    Wrist extension    Wrist ulnar deviation    Wrist radial deviation    Wrist pronation    Wrist supination    (Blank rows = not tested)  UPPER EXTREMITY MMT:     MMT Right eval Left eval  Shoulder flexion 3/5 3+/5  Shoulder abduction    Shoulder adduction    Shoulder extension    Shoulder internal rotation    Shoulder external rotation    Middle trapezius    Lower trapezius    Elbow flexion    Elbow extension    Wrist flexion    Wrist extension    Wrist ulnar deviation    Wrist radial deviation    Wrist pronation    Wrist supination    (Blank rows = not tested)  HAND FUNCTION: Grip strength: Right: 52.9, 49.8, 45.6 lbs; Left: 52.9, 54.2, 55.1 lbs Average: Right: 49.4 lbs Left: 54.1 lbs  COORDINATION: 9 Hole Peg test: Right: 53.17 sec; Left: 48.01  sec  SENSATION: WFL  EDEMA: NA  MUSCLE TONE: Atypical - OTR noted some jitteriness with slight shaking in R>LUE, possibly due to generalized weakness   COGNITION: Overall cognitive status: Impaired - increased memory issues since last onset of issues (Pt in Speech Therapy)  VISION: Subjective report: Recent cataract surgery - awaiting new prescription at next appt Baseline vision: Bifocals Visual history: cataracts, retinopathy, and diabetic  VISION ASSESSMENT: Not tested  Patient has difficulty with following activities due to following visual impairments: Reading, driving   PERCEPTION: Not tested  PRAXIS: Not tested  OBSERVATIONS: Pt ambulates with rollator with careful maneuvering around chair to sit safely.  She had no loss of balance but did slide into the chair. The pt appears well kept and had socks and shoes donned today which is reports is a first in recent history as socks are hard to get on herself.   TODAY'S TREATMENT:                                                                                                                              DATE:  Therapeutic Activities D/t pt reporting recent fall, OT educated pt on decreasing fall risk, using stable chair for sitting. Pt verbalized understanding and stated intent to ask family member to obtain a stable chair.  OT initiated coordination HEP - handout provided, see pt instructions. Pt demo'd understanding with extra time and v/c. OT educated pt on dx, dexterity, purpose of HEP. Pt verbalized understanding.  -Picking up coins and placing in container, reaching across midline - placing in open container (x10 coins) then placing in container with slot (x10 coins) - to improve RUE coordination, dexterity, functional reach  -Shuffling cards - to improve bilateral integration, RUE coordination and dexterity - x5 reps  -Flipping cards - to improve RUE coordination and dexterity - x4 reps for 3 sets - flip card top over  bottom, flip card over side with palm down, flipping card with palm down (notes added to handout).  -Stacking/unstacking coins - to improve RUE coordination and dexterity - x5 stacks of 5 pennies each.  -Tear off piece of tissue and roll into small ball - to improve RUE coordination and dexterity - x3 reps  -Rolling pen, pen translation - to improve RUE coordination and dexterity - x2 reps each    PATIENT EDUCATION: Education details: see today's treatment above Person educated: Patient Education method: Explanation, Demonstration, Verbal cues, and Handouts Education comprehension: verbalized understanding, returned demonstration, verbal cues required, and needs further education  HOME EXERCISE PROGRAM: 04/05/23 - theraputty (pink) - Access Code: [email or web address] 04/09/23 - coordination exercises (handout, large print provided per pt request)   GOALS: Goals reviewed with patient? Yes  SHORT TERM GOALS: Target date: 04/27/23  Patient will demonstrate updated B HEP with 25% verbal cues or less for proper execution. Baseline: New to outpt OT Goal status: INITIAL  2.  Patient will demonstrate at least 50+ lbs R UE grip strength as needed to open jars and other containers. Baseline: 49.4 lbs with consistently lower strength x 3 trials. Goal status: INITIAL  3.  Patient will complete nine-hole peg with use of BUE in 45 seconds or less.  Baseline: Right: 53.17 sec; Left: 48.01 sec Goal status: INITIAL  4.  Patient will be able to use adaptive equipment/strategies to manage buttons, socks and shoes. Baseline: Avoids clothing with buttons - dresses  with pullovers and rarely wears socks Goal status: INITIAL   LONG TERM GOALS: Target date: 05/25/23  Patient will demonstrate updated B HEP with visual handouts only for proper execution. Baseline: New to outpt OT Goal status: INITIAL  2.  Patient will write a sentence, list or check and maintain 75% legibility. Baseline: 50% with large  font and emotional/frustrated Goal status: INITIAL  3.  Patient will have ideas re: AE/appliances and recipes for appropriate food options for increased variety of meals ie) Microwave/Mug meals, etc. Baseline: Pt uses microwave with pre made/frozen meals. Goal status: INITIAL  4.  Patient will demonstrate improvement with cooking, buttons and handwriting, indicating improved functional use of affected extremity for PSFS score of 5.0 +. Baseline: Patient Specific Functional Scale: 1.7 Goal status: INITIAL  ASSESSMENT:  CLINICAL IMPRESSION: Pt tolerated tasks well today. Pt benefits from modeling, v/c, and extra time to complete unfamiliar tasks. Pt would benefit from skilled OT services in the outpatient setting to work on impairments as noted below to help pt return to PLOF as able.     PERFORMANCE DEFICITS: in functional skills including ADLs, IADLs, coordination, dexterity, strength, Fine motor control, Gross motor control, balance, decreased knowledge of precautions, decreased knowledge of use of DME, vision, and UE functional use, cognitive skills including attention, emotional, memory, and problem solving, and psychosocial skills including coping strategies, environmental adaptation, interpersonal interactions, and routines and behaviors.   IMPAIRMENTS: are limiting patient from ADLs, IADLs, work, and leisure.   CO-MORBIDITIES: has co-morbidities such as recent cataract surgery  that affects occupational performance. Patient will benefit from skilled OT to address above impairments and improve overall function.  MODIFICATION OR ASSISTANCE TO COMPLETE EVALUATION: No modification of tasks or assist necessary to complete an evaluation.  OT OCCUPATIONAL PROFILE AND HISTORY: Problem focused assessment: Including review of records relating to presenting problem.  CLINICAL DECISION MAKING: LOW - limited treatment options, no task modification necessary  REHAB POTENTIAL: Good  EVALUATION  COMPLEXITY: Low    PLAN:  OT FREQUENCY: 1-2x/week  OT DURATION: 8 weeks  PLANNED INTERVENTIONS: 97535 self care/ADL training, 08657 therapeutic exercise, 97530 therapeutic activity, 97112 neuromuscular re-education, 97113 aquatic therapy, [id number] Cognitive training (first 15 min), 84696 Cognitive training(each additional 15 min), balance training, visual/perceptual remediation/compensation, psychosocial skills training, energy conservation, coping strategies training, patient/family education, and DME and/or AE instructions  RECOMMENDED OTHER SERVICES: Pt planning to get new referral to PT.  Currently receiving ST services.  Pt hoping to get home health services.  CONSULTED AND AGREED WITH PLAN OF CARE: Patient  PLAN FOR NEXT SESSION:  Review theraputty, coordination HEP PRN  Coordination/writing - check in about tripod pencil grip AE considerations for ADLs/IADLS Further vision evaluation once new glasses are received   Wynetta Emery, OT 04/09/2023, 4:35 PM

## 2023-04-09 NOTE — Therapy (Signed)
OUTPATIENT SPEECH LANGUAGE PATHOLOGY TREATMENT (DISCHARGE)   Patient Name: Christina Orozco MRN: [id number] DOB:16-Jun-1970, 52 y.o., female Today's Date: 04/09/2023  PCP: Kerin Salen, PA-C REFERRING PROVIDER: Kerin Salen, PA-C  END OF SESSION:  End of Session - 04/09/23 1325     Visit Number 7    Number of Visits 17    Date for SLP Re-Evaluation 04/30/23    Authorization Type [employer] Dual Complete    Progress Note Due on Visit 10    SLP Start Time 1317    SLP Stop Time  1348    SLP Time Calculation (min) 31 min    Activity Tolerance Patient tolerated treatment well            SPEECH THERAPY DISCHARGE SUMMARY  Visits from Start of Care: 7  Current functional level related to goals / functional outcomes: Successfully utilizing trained dysarthria strategies with speech intelligibility increasing to 100% in extended unstructured conversations. No overt word finding episodes exhibited.    Remaining deficits: Complex med hx    Education / Equipment: Dysarthria strategies, functional tasks   Patient agrees to discharge. Patient goals were met. Patient is being discharged due to meeting the stated rehab goals.      Past Medical History:  Diagnosis Date   Anemia    severe   Anxiety    Asthma    CKD (chronic kidney disease)    stage IV   DDD (degenerative disc disease), cervical    Depression    Diabetes mellitus (HCC)    Dislocated shoulder    right   GERD (gastroesophageal reflux disease)    Headache    migraines (about once a month)   History of kidney stones    Hypertension    Neuropathy    Peripheral vascular disease (HCC)    Pneumonia    Stroke [facility])    Past Surgical History:  Procedure Laterality Date   ADENOIDECTOMY     APPENDECTOMY     CERVICAL SPINE SURGERY  06/2012   C5-C6   EYE SURGERY     left eye surgery    FOOT SURGERY     KENALOG INJECTION Left 03/10/2021   Procedure: Intravitreal Avastin Injection;   Surgeon: Stephannie Li, MD;  Location: [facility] OR;  Service: Ophthalmology;  Laterality: Left;   LOOP RECORDER INSERTION N/A 08/27/2017   Procedure: LOOP RECORDER INSERTION;  Surgeon: Thurmon Fair, MD;  Location: [facility] INVASIVE CV LAB;  Service: Cardiovascular;  Laterality: N/A;   MEMBRANE PEEL Left 03/10/2021   Procedure: MEMBRANE PEEL;  Surgeon: Stephannie Li, MD;  Location: [facility] OR;  Service: Ophthalmology;  Laterality: Left;   PARS PLANA VITRECTOMY Left 03/10/2021   Procedure: TWENTY-FIVE GAUGE PARS PLANA VITRECTOMY;  Surgeon: Stephannie Li, MD;  Location: [facility] OR;  Service: Ophthalmology;  Laterality: Left;   PHOTOCOAGULATION WITH LASER Left 03/10/2021   Procedure: PHOTOCOAGULATION WITH LASER;  Surgeon: Stephannie Li, MD;  Location: [facility] OR;  Service: Ophthalmology;  Laterality: Left;   SHOULDER SURGERY     TEE WITHOUT CARDIOVERSION N/A 08/27/2017   Procedure: TRANSESOPHAGEAL ECHOCARDIOGRAM (TEE);  Surgeon: Thurmon Fair, MD;  Location: [facility] ENDOSCOPY;  Service: Cardiovascular;  Laterality: N/A;   TONSILLECTOMY     Patient Active Problem List   Diagnosis Date Noted   Diplopia 10/31/2022   OSA (obstructive sleep apnea) 08/25/2022   Subcortical infarction (HCC) 08/23/2022   Overweight (BMI 25.0-29.9) 08/23/2022   Asthma, chronic 08/22/2022   TIA (transient ischemic attack) 06/20/2022   Numbness and tingling in  left hand 03/02/2022   Gait disturbance, post-stroke 03/02/2022   AKI (acute kidney injury) (HCC) 12/23/2021   Acute embolic stroke (HCC) 12/23/2021   Hypernatremia 12/20/2021   Chronic kidney disease (CKD), stage IV (severe) (HCC) 12/19/2021   Left-sided weakness and visual deficits  12/19/2021   Vision abnormalities 12/19/2021   Acute stroke due to ischemia [facility]) 11/10/2021   Depression 07/05/2020   CVA (cerebral vascular accident) (HCC) 06/21/2020   Generalized anxiety disorder 03/01/2019   Abnormality of gait 10/14/2018   Dizziness and giddiness 09/13/2018   Debility 06/26/2018    Chronic nonintractable headache 04/26/2018   Diastolic dysfunction    Congestion of nasal sinus    Pneumonia of left lower lobe due to infectious organism    SOB (shortness of breath)    Candidiasis    Anemia    Hypoalbuminemia due to protein-calorie malnutrition (HCC)    Hypertension    Type 2 diabetes mellitus with chronic kidney disease, without long-term current use of insulin (HCC)    PRES (posterior reversible encephalopathy syndrome)    Hypokalemia    History of CVA with residual deficit    History of seizure 03/25/2018   Bilateral carpal tunnel syndrome 02/14/2018   Arthritis 10/31/2017   Asthma 10/31/2017   Constipation 10/31/2017   Kidney stone 10/31/2017   Anemia, chronic disease 08/09/2017   Hyperlipidemia 07/26/2017   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HCC) 07/25/2017   Anxiety as acute reaction to exceptional stress 04/23/2017   Insomnia 10/08/2016   Neuropathy 06/07/2016   Gait instability    Lesion of pons    Slurred speech    Vertigo 04/30/2016   Hyperglycemia 04/30/2016   Chronic kidney disease (CKD), stage III (moderate) (HCC) 04/30/2016   Ataxia 04/30/2016   Chronic right shoulder pain 04/03/2016   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  Pt accompanied by: self  PERTINENT HISTORY:  recurrent stroke (ILR placed 2019 removed 07/2019 after no atrial arrhythmias noted), seizure, vertigo.  Scheduled to have cataract surgery in October   PAIN: Are you having pain? No  FALLS: Has patient fallen in last 6 months?  Yes, See PT evaluation for details  LIVING ENVIRONMENT: Lives with: lives alone Lives in: House/apartment  PLOF:  Level of assistance: Needed assistance with ADLs, Needed assistance with IADLS Employment: On disability  PATIENT GOALS: "To talk. I want my mom to stop correcting me (when I talk)"  OBJECTIVE:   TODAY'S TREATMENT:  04/09/23: Pt rated 100% intelligible in extended unstructured conversation today with pt able to intermittently self-correct as needed. Pt reports positive feedback from communication partners supporting increased communication effectiveness outside of therapy. Re-administered communication PROM with score of 26, which is 11 point improvement from evaluation. Pt is pleased with current level of functioning and agreeable to ST d/c this date.   04/05/23: Pt with teach back of 4/4 dysarthria strategies. SLP reviewed recommendation for voice projection to aid in improved vocal quality. Use of visual aid with min cues to reference to carryover in conversational task. 100% intelligible. Plan to d/c next session, pt in agreement.   04/03/23: Maintained 100% intelligibility in structured task and extended conversation today, with rare self-corrections of speech errors required. Observed intermittent breathy, pressed voice today, in which SLP cued increased breath support and projection which aided vocal clarity in conversation. Continue to target in upcoming sessions if observed.    03/29/23: Reviewed dysarthria strategies. Pt noted positive affirmation of improving speech from mother. Addressed dysarthria in discourse level language task in which pt summarized 5 minute video stimuli. Pt achieved 100% intelligibility with  no cues and occasional self-corrections of mispronounced words.   03/27/23: SLP introduced anomia strategies to aid in recovery when word finding difficulties present. See pt instructions. Practiced description strategy with pt able to fully describe x1 target with occasional min-A. Reviewed dysarthria strategies addressed last session. SLP led pt through structured practice of employment of dysarthria and anomia strategies in picture description task. Pt presenting with improved articulation in structured task vs unstructured conversation following. However, pt evidences 100% intelligibility without demonstration of strategies. Pt supports she was not utilizing strategies in unstructured conversation. Corrected articulation errors x3 in today's session.  03-12-23: SLP educated pt on dysarthria strategies to increase intelligibility. Targeted in structured naming and description activity. When utilizing strategies, pt increased to 95% intelligibility given occasional mod A. Benefited from cues to think of what she was going to say first, slow down, and over-enunciate. Experienced one instance of imprecise articulation which led to increased frustration. Pt successful with word once given visual cue and SLP model of breaking word into syllables. Practiced dysarthria strategies in structured activity targeting 3-4 syllable words. Pt achieved 100% intelligibility given occasional mod A with previously mentioned cues.  03-05-23: Eval only    PATIENT EDUCATION: Education details: see above Person educated: Patient Education method: Explanation, Demonstration, and Handouts Education comprehension: verbalized understanding, returned demonstration, and needs further education   GOALS: Goals reviewed with patient? Yes  SHORT TERM GOALS: Target date: 04/02/2023  Pt will utilize dysarthria strategies during structured activity to be 100% intelligible with occasional min A  Baseline: Goal status: MET  2.   Pt will demonstrate increased awareness of speech errors with attempts to self-correct for 50% of opportunities in structured activity  Baseline: 03/29/23 Goal status: MET  3.  Pt will utilize word retrieval strategies during structured conversations x2 with occasional min A  Baseline:  Goal status: MET  4.  Pt will self-advocate for additional processing time as needed for 2/2 opportunities without prompting  Baseline:  Goal status: MET  LONG TERM GOALS: Target date: 04/30/2023  Pt will utilize dysarthria strategies during unstructured conversation to be 100% intelligible with rare min A  Baseline: 03/29/23 Goal status: MET  2.  Pt will demonstrate increased awareness of speech errors and self-correct for 80% of opportunities in unstructured conversations x2 Baseline:  Goal status: MET  3.  Pt will utilize word retrieval  strategies during unstructured conversations x2 with rare min A  Baseline:  Goal status: MET  4.  Pt will report improved communication effectiveness via PROM by 2 points at LTG date Baseline: CPIB=15, 26 Goal status: MET   ASSESSMENT:  CLINICAL IMPRESSION: Pt seen today for ST addressing dysarthria. SLP provided review of dysarthria strategies. Rated 100% intelligible this date during extended conversational exchange with demonstration of ability to self-correct as needed. Pt endorses positive feedback from communication partners affirming improved speech intelligibility. ST goals met; ST d/c completed. Denied any questions or concerns.   OBJECTIVE IMPAIRMENTS: include attention, memory, awareness, aphasia, and dysarthria. These impairments are limiting patient from effectively communicating at home and in community. Factors affecting potential to achieve goals and functional outcome are co-morbidities and medical prognosis. Patient will benefit from skilled SLP services to address above impairments and improve overall function.  REHAB POTENTIAL:  Good  PLAN:  SLP FREQUENCY: 1-2x/week  SLP DURATION: 8 weeks  PLANNED INTERVENTIONS: Cueing hierachy, Cognitive reorganization, Internal/external aids, Functional tasks, SLP instruction and feedback, Compensatory strategies, and Patient/family education    Gracy Racer, CCC-SLP 04/09/2023, 1:51 PM

## 2023-04-12 ENCOUNTER — Ambulatory Visit: Payer: 59 | Admitting: Psychology

## 2023-04-13 ENCOUNTER — Other Ambulatory Visit: Payer: Self-pay

## 2023-04-13 ENCOUNTER — Encounter (HOSPITAL_COMMUNITY): Payer: Self-pay

## 2023-04-13 ENCOUNTER — Inpatient Hospital Stay (HOSPITAL_COMMUNITY)
Admission: EM | Admit: 2023-04-13 | Discharge: 2023-04-17 | DRG: 065 | Disposition: A | Discharge status: Rehabilitation Facility | Payer: 59 | Attending: Internal Medicine | Admitting: Internal Medicine

## 2023-04-13 DIAGNOSIS — F32A Depression, unspecified: Secondary | ICD-10-CM | POA: Diagnosis present

## 2023-04-13 DIAGNOSIS — Z803 Family history of malignant neoplasm of breast: Secondary | ICD-10-CM

## 2023-04-13 DIAGNOSIS — E11319 Type 2 diabetes mellitus with unspecified diabetic retinopathy without macular edema: Secondary | ICD-10-CM | POA: Diagnosis present

## 2023-04-13 DIAGNOSIS — H532 Diplopia: Secondary | ICD-10-CM | POA: Diagnosis present

## 2023-04-13 DIAGNOSIS — Z888 Allergy status to other drugs, medicaments and biological substances status: Secondary | ICD-10-CM

## 2023-04-13 DIAGNOSIS — K5900 Constipation, unspecified: Secondary | ICD-10-CM | POA: Diagnosis present

## 2023-04-13 DIAGNOSIS — Z7982 Long term (current) use of aspirin: Secondary | ICD-10-CM

## 2023-04-13 DIAGNOSIS — I10 Essential (primary) hypertension: Secondary | ICD-10-CM | POA: Diagnosis present

## 2023-04-13 DIAGNOSIS — I69351 Hemiplegia and hemiparesis following cerebral infarction affecting right dominant side: Secondary | ICD-10-CM

## 2023-04-13 DIAGNOSIS — I6389 Other cerebral infarction: Secondary | ICD-10-CM | POA: Diagnosis not present

## 2023-04-13 DIAGNOSIS — E119 Type 2 diabetes mellitus without complications: Secondary | ICD-10-CM

## 2023-04-13 DIAGNOSIS — Z79899 Other long term (current) drug therapy: Secondary | ICD-10-CM

## 2023-04-13 DIAGNOSIS — Z880 Allergy status to penicillin: Secondary | ICD-10-CM

## 2023-04-13 DIAGNOSIS — E1122 Type 2 diabetes mellitus with diabetic chronic kidney disease: Secondary | ICD-10-CM

## 2023-04-13 DIAGNOSIS — R531 Weakness: Secondary | ICD-10-CM

## 2023-04-13 DIAGNOSIS — N184 Chronic kidney disease, stage 4 (severe): Secondary | ICD-10-CM | POA: Diagnosis present

## 2023-04-13 DIAGNOSIS — D631 Anemia in chronic kidney disease: Secondary | ICD-10-CM | POA: Diagnosis present

## 2023-04-13 DIAGNOSIS — Z7985 Long-term (current) use of injectable non-insulin antidiabetic drugs: Secondary | ICD-10-CM

## 2023-04-13 DIAGNOSIS — K219 Gastro-esophageal reflux disease without esophagitis: Secondary | ICD-10-CM | POA: Diagnosis present

## 2023-04-13 DIAGNOSIS — E860 Dehydration: Secondary | ICD-10-CM | POA: Diagnosis present

## 2023-04-13 DIAGNOSIS — I639 Cerebral infarction, unspecified: Secondary | ICD-10-CM | POA: Diagnosis not present

## 2023-04-13 DIAGNOSIS — Z7902 Long term (current) use of antithrombotics/antiplatelets: Secondary | ICD-10-CM

## 2023-04-13 DIAGNOSIS — E1151 Type 2 diabetes mellitus with diabetic peripheral angiopathy without gangrene: Secondary | ICD-10-CM | POA: Diagnosis present

## 2023-04-13 DIAGNOSIS — G4733 Obstructive sleep apnea (adult) (pediatric): Secondary | ICD-10-CM | POA: Diagnosis present

## 2023-04-13 DIAGNOSIS — R112 Nausea with vomiting, unspecified: Principal | ICD-10-CM

## 2023-04-13 DIAGNOSIS — I129 Hypertensive chronic kidney disease with stage 1 through stage 4 chronic kidney disease, or unspecified chronic kidney disease: Secondary | ICD-10-CM | POA: Diagnosis present

## 2023-04-13 DIAGNOSIS — Z833 Family history of diabetes mellitus: Secondary | ICD-10-CM

## 2023-04-13 DIAGNOSIS — Z8249 Family history of ischemic heart disease and other diseases of the circulatory system: Secondary | ICD-10-CM

## 2023-04-13 DIAGNOSIS — H53461 Homonymous bilateral field defects, right side: Secondary | ICD-10-CM | POA: Diagnosis present

## 2023-04-13 DIAGNOSIS — E1159 Type 2 diabetes mellitus with other circulatory complications: Secondary | ICD-10-CM | POA: Diagnosis present

## 2023-04-13 DIAGNOSIS — E872 Acidosis, unspecified: Secondary | ICD-10-CM | POA: Diagnosis present

## 2023-04-13 DIAGNOSIS — R9431 Abnormal electrocardiogram [ECG] [EKG]: Secondary | ICD-10-CM | POA: Diagnosis present

## 2023-04-13 DIAGNOSIS — J4520 Mild intermittent asthma, uncomplicated: Secondary | ICD-10-CM | POA: Diagnosis present

## 2023-04-13 DIAGNOSIS — E1140 Type 2 diabetes mellitus with diabetic neuropathy, unspecified: Secondary | ICD-10-CM | POA: Diagnosis present

## 2023-04-13 DIAGNOSIS — Z862 Personal history of diseases of the blood and blood-forming organs and certain disorders involving the immune mechanism: Secondary | ICD-10-CM

## 2023-04-13 LAB — CBC
HCT: 28.8 % — ABNORMAL LOW (ref 36.0–46.0)
Hemoglobin: 9.3 g/dL — ABNORMAL LOW (ref 12.0–15.0)
MCH: 28.5 pg (ref 26.0–34.0)
MCHC: 32.3 g/dL (ref 30.0–36.0)
MCV: 88.3 fL (ref 80.0–100.0)
Platelets: 273 10*3/uL (ref 150–400)
RBC: 3.26 MIL/uL — ABNORMAL LOW (ref 3.87–5.11)
RDW: 14.6 % (ref 11.5–15.5)
WBC: 10.3 10*3/uL (ref 4.0–10.5)
nRBC: 0 % (ref 0.0–0.2)

## 2023-04-13 LAB — BASIC METABOLIC PANEL
Anion gap: 12 (ref 5–15)
BUN: 34 mg/dL — ABNORMAL HIGH (ref 6–20)
CO2: 18 mmol/L — ABNORMAL LOW (ref 22–32)
Calcium: 9.4 mg/dL (ref 8.9–10.3)
Chloride: 111 mmol/L (ref 98–111)
Creatinine, Ser: 2.66 mg/dL — ABNORMAL HIGH (ref 0.44–1.00)
GFR, Estimated: 21 mL/min — ABNORMAL LOW (ref >60)
Glucose, Bld: 219 mg/dL — ABNORMAL HIGH (ref 70–99)
Potassium: 3.7 mmol/L (ref 3.5–5.1)
Sodium: 141 mmol/L (ref 135–145)

## 2023-04-13 LAB — CBG MONITORING, ED: Glucose-Capillary: 171 mg/dL — ABNORMAL HIGH (ref 70–99)

## 2023-04-13 NOTE — ED Triage Notes (Signed)
Pt presents via EMS c/o syncopal episode at home while ambulating to the restroom. EMS reports pt denies head/neck/back pain. Pt c/o nausea. Pt has hx of CVA with right sided deficit. Currently A&O x4 per EMS.

## 2023-04-14 ENCOUNTER — Emergency Department (HOSPITAL_COMMUNITY): Payer: 59

## 2023-04-14 ENCOUNTER — Encounter (HOSPITAL_COMMUNITY): Payer: Self-pay | Admitting: Internal Medicine

## 2023-04-14 DIAGNOSIS — E860 Dehydration: Secondary | ICD-10-CM | POA: Diagnosis present

## 2023-04-14 DIAGNOSIS — Z7982 Long term (current) use of aspirin: Secondary | ICD-10-CM | POA: Diagnosis not present

## 2023-04-14 DIAGNOSIS — Z794 Long term (current) use of insulin: Secondary | ICD-10-CM | POA: Diagnosis not present

## 2023-04-14 DIAGNOSIS — R195 Other fecal abnormalities: Secondary | ICD-10-CM | POA: Diagnosis not present

## 2023-04-14 DIAGNOSIS — E1140 Type 2 diabetes mellitus with diabetic neuropathy, unspecified: Secondary | ICD-10-CM | POA: Diagnosis present

## 2023-04-14 DIAGNOSIS — J4520 Mild intermittent asthma, uncomplicated: Secondary | ICD-10-CM | POA: Diagnosis present

## 2023-04-14 DIAGNOSIS — N184 Chronic kidney disease, stage 4 (severe): Secondary | ICD-10-CM | POA: Diagnosis present

## 2023-04-14 DIAGNOSIS — H532 Diplopia: Secondary | ICD-10-CM | POA: Diagnosis present

## 2023-04-14 DIAGNOSIS — D649 Anemia, unspecified: Secondary | ICD-10-CM | POA: Diagnosis not present

## 2023-04-14 DIAGNOSIS — E11319 Type 2 diabetes mellitus with unspecified diabetic retinopathy without macular edema: Secondary | ICD-10-CM | POA: Diagnosis present

## 2023-04-14 DIAGNOSIS — E1122 Type 2 diabetes mellitus with diabetic chronic kidney disease: Secondary | ICD-10-CM | POA: Diagnosis present

## 2023-04-14 DIAGNOSIS — Z833 Family history of diabetes mellitus: Secondary | ICD-10-CM | POA: Diagnosis not present

## 2023-04-14 DIAGNOSIS — E872 Acidosis, unspecified: Secondary | ICD-10-CM | POA: Diagnosis present

## 2023-04-14 DIAGNOSIS — Z79899 Other long term (current) drug therapy: Secondary | ICD-10-CM | POA: Diagnosis not present

## 2023-04-14 DIAGNOSIS — I10 Essential (primary) hypertension: Secondary | ICD-10-CM | POA: Diagnosis not present

## 2023-04-14 DIAGNOSIS — K219 Gastro-esophageal reflux disease without esophagitis: Secondary | ICD-10-CM | POA: Diagnosis present

## 2023-04-14 DIAGNOSIS — G4733 Obstructive sleep apnea (adult) (pediatric): Secondary | ICD-10-CM | POA: Diagnosis present

## 2023-04-14 DIAGNOSIS — F32A Depression, unspecified: Secondary | ICD-10-CM | POA: Diagnosis present

## 2023-04-14 DIAGNOSIS — I69351 Hemiplegia and hemiparesis following cerebral infarction affecting right dominant side: Secondary | ICD-10-CM | POA: Diagnosis not present

## 2023-04-14 DIAGNOSIS — N179 Acute kidney failure, unspecified: Secondary | ICD-10-CM | POA: Diagnosis not present

## 2023-04-14 DIAGNOSIS — E1151 Type 2 diabetes mellitus with diabetic peripheral angiopathy without gangrene: Secondary | ICD-10-CM | POA: Diagnosis present

## 2023-04-14 DIAGNOSIS — I639 Cerebral infarction, unspecified: Secondary | ICD-10-CM | POA: Diagnosis present

## 2023-04-14 DIAGNOSIS — Z7902 Long term (current) use of antithrombotics/antiplatelets: Secondary | ICD-10-CM | POA: Diagnosis not present

## 2023-04-14 DIAGNOSIS — I63311 Cerebral infarction due to thrombosis of right middle cerebral artery: Secondary | ICD-10-CM | POA: Diagnosis not present

## 2023-04-14 DIAGNOSIS — I129 Hypertensive chronic kidney disease with stage 1 through stage 4 chronic kidney disease, or unspecified chronic kidney disease: Secondary | ICD-10-CM | POA: Diagnosis present

## 2023-04-14 DIAGNOSIS — Z8249 Family history of ischemic heart disease and other diseases of the circulatory system: Secondary | ICD-10-CM | POA: Diagnosis not present

## 2023-04-14 DIAGNOSIS — D62 Acute posthemorrhagic anemia: Secondary | ICD-10-CM | POA: Diagnosis not present

## 2023-04-14 DIAGNOSIS — E1149 Type 2 diabetes mellitus with other diabetic neurological complication: Secondary | ICD-10-CM | POA: Diagnosis not present

## 2023-04-14 DIAGNOSIS — E113591 Type 2 diabetes mellitus with proliferative diabetic retinopathy without macular edema, right eye: Secondary | ICD-10-CM | POA: Diagnosis not present

## 2023-04-14 DIAGNOSIS — Z888 Allergy status to other drugs, medicaments and biological substances status: Secondary | ICD-10-CM | POA: Diagnosis not present

## 2023-04-14 DIAGNOSIS — R9431 Abnormal electrocardiogram [ECG] [EKG]: Secondary | ICD-10-CM | POA: Diagnosis present

## 2023-04-14 DIAGNOSIS — D631 Anemia in chronic kidney disease: Secondary | ICD-10-CM | POA: Diagnosis present

## 2023-04-14 DIAGNOSIS — I6389 Other cerebral infarction: Secondary | ICD-10-CM | POA: Diagnosis present

## 2023-04-14 DIAGNOSIS — Z880 Allergy status to penicillin: Secondary | ICD-10-CM | POA: Diagnosis not present

## 2023-04-14 DIAGNOSIS — Z7985 Long-term (current) use of injectable non-insulin antidiabetic drugs: Secondary | ICD-10-CM | POA: Diagnosis not present

## 2023-04-14 DIAGNOSIS — I6340 Cerebral infarction due to embolism of unspecified cerebral artery: Secondary | ICD-10-CM | POA: Diagnosis not present

## 2023-04-14 LAB — VITAMIN B12: Vitamin B-12: 1021 pg/mL — ABNORMAL HIGH (ref 180–914)

## 2023-04-14 LAB — CBC WITH DIFFERENTIAL/PLATELET
Abs Immature Granulocytes: 0.05 10*3/uL (ref 0.00–0.07)
Basophils Absolute: 0 10*3/uL (ref 0.0–0.1)
Basophils Relative: 0 %
Eosinophils Absolute: 0 10*3/uL (ref 0.0–0.5)
Eosinophils Relative: 0 %
HCT: 28.8 % — ABNORMAL LOW (ref 36.0–46.0)
Hemoglobin: 9.4 g/dL — ABNORMAL LOW (ref 12.0–15.0)
Immature Granulocytes: 1 %
Lymphocytes Relative: 12 %
Lymphs Abs: 1.3 10*3/uL (ref 0.7–4.0)
MCH: 28.2 pg (ref 26.0–34.0)
MCHC: 32.6 g/dL (ref 30.0–36.0)
MCV: 86.5 fL (ref 80.0–100.0)
Monocytes Absolute: 0.4 10*3/uL (ref 0.1–1.0)
Monocytes Relative: 4 %
Neutro Abs: 9 10*3/uL — ABNORMAL HIGH (ref 1.7–7.7)
Neutrophils Relative %: 83 %
Platelets: 290 10*3/uL (ref 150–400)
RBC: 3.33 MIL/uL — ABNORMAL LOW (ref 3.87–5.11)
RDW: 14.7 % (ref 11.5–15.5)
WBC: 10.8 10*3/uL — ABNORMAL HIGH (ref 4.0–10.5)
nRBC: 0 % (ref 0.0–0.2)

## 2023-04-14 LAB — LIPID PANEL
Cholesterol: 126 mg/dL (ref 0–200)
HDL: 76 mg/dL (ref >40)
LDL Cholesterol: 43 mg/dL (ref 0–99)
Total CHOL/HDL Ratio: 1.7 {ratio}
Triglycerides: 34 mg/dL (ref <150)
VLDL: 7 mg/dL (ref 0–40)

## 2023-04-14 LAB — COMPREHENSIVE METABOLIC PANEL
ALT: 38 U/L (ref 0–44)
AST: 27 U/L (ref 15–41)
Albumin: 3.9 g/dL (ref 3.5–5.0)
Alkaline Phosphatase: 104 U/L (ref 38–126)
Anion gap: 10 (ref 5–15)
BUN: 32 mg/dL — ABNORMAL HIGH (ref 6–20)
CO2: 19 mmol/L — ABNORMAL LOW (ref 22–32)
Calcium: 9.7 mg/dL (ref 8.9–10.3)
Chloride: 114 mmol/L — ABNORMAL HIGH (ref 98–111)
Creatinine, Ser: 2.64 mg/dL — ABNORMAL HIGH (ref 0.44–1.00)
GFR, Estimated: 22 mL/min — ABNORMAL LOW (ref >60)
Glucose, Bld: 140 mg/dL — ABNORMAL HIGH (ref 70–99)
Potassium: 3.5 mmol/L (ref 3.5–5.1)
Sodium: 143 mmol/L (ref 135–145)
Total Bilirubin: 0.6 mg/dL (ref 0.3–1.2)
Total Protein: 7.7 g/dL (ref 6.5–8.1)

## 2023-04-14 LAB — CBG MONITORING, ED: Glucose-Capillary: 146 mg/dL — ABNORMAL HIGH (ref 70–99)

## 2023-04-14 LAB — HEMOGLOBIN A1C
Hgb A1c MFr Bld: 6.1 % — ABNORMAL HIGH (ref 4.8–5.6)
Mean Plasma Glucose: 128.37 mg/dL

## 2023-04-14 LAB — MAGNESIUM: Magnesium: 1.9 mg/dL (ref 1.7–2.4)

## 2023-04-14 LAB — TSH: TSH: 4.421 u[IU]/mL (ref 0.350–4.500)

## 2023-04-14 LAB — CK: Total CK: 112 U/L (ref 38–234)

## 2023-04-14 MED ORDER — PANTOPRAZOLE SODIUM 40 MG IV SOLR
40.0000 mg | Freq: Once | INTRAVENOUS | Status: AC
  Administered 2023-04-14: 40 mg via INTRAVENOUS
  Filled 2023-04-14: qty 10

## 2023-04-14 MED ORDER — ASPIRIN 81 MG PO TBEC
81.0000 mg | DELAYED_RELEASE_TABLET | Freq: Two times a day (BID) | ORAL | Status: DC
  Administered 2023-04-14 – 2023-04-17 (×7): 81 mg via ORAL
  Filled 2023-04-14 (×7): qty 1

## 2023-04-14 MED ORDER — INSULIN ASPART 100 UNIT/ML IJ SOLN
0.0000 [IU] | Freq: Three times a day (TID) | INTRAMUSCULAR | Status: DC
  Administered 2023-04-14: 1 [IU] via SUBCUTANEOUS
  Administered 2023-04-15: 3 [IU] via SUBCUTANEOUS
  Administered 2023-04-16: 1 [IU] via SUBCUTANEOUS
  Administered 2023-04-16: 2 [IU] via SUBCUTANEOUS

## 2023-04-14 MED ORDER — FAMOTIDINE IN NACL 20-0.9 MG/50ML-% IV SOLN
20.0000 mg | Freq: Once | INTRAVENOUS | Status: AC
  Administered 2023-04-14: 20 mg via INTRAVENOUS
  Filled 2023-04-14: qty 50

## 2023-04-14 MED ORDER — LORAZEPAM 2 MG/ML IJ SOLN: 0.5000 mg | Freq: Four times a day (QID) | INTRAMUSCULAR | Status: DC | PRN

## 2023-04-14 MED ORDER — ACETAMINOPHEN 325 MG PO TABS: 650.0000 mg | ORAL_TABLET | Freq: Four times a day (QID) | ORAL | Status: DC | PRN

## 2023-04-14 MED ORDER — PANTOPRAZOLE SODIUM 40 MG PO TBEC
40.0000 mg | DELAYED_RELEASE_TABLET | Freq: Every day | ORAL | Status: DC
  Administered 2023-04-14 – 2023-04-17 (×4): 40 mg via ORAL
  Filled 2023-04-14 (×4): qty 1

## 2023-04-14 MED ORDER — MONTELUKAST SODIUM 10 MG PO TABS
10.0000 mg | ORAL_TABLET | Freq: Every day | ORAL | Status: DC
  Administered 2023-04-14 – 2023-04-16 (×3): 10 mg via ORAL
  Filled 2023-04-14 (×3): qty 1

## 2023-04-14 MED ORDER — ALBUTEROL SULFATE (2.5 MG/3ML) 0.083% IN NEBU: 2.5000 mg | INHALATION_SOLUTION | RESPIRATORY_TRACT | Status: DC | PRN

## 2023-04-14 MED ORDER — METOCLOPRAMIDE HCL 5 MG/ML IJ SOLN
10.0000 mg | INTRAMUSCULAR | Status: AC
  Administered 2023-04-14: 10 mg via INTRAVENOUS
  Filled 2023-04-14: qty 2

## 2023-04-14 MED ORDER — LABETALOL HCL 5 MG/ML IV SOLN: 10.0000 mg | INTRAVENOUS | Status: DC | PRN

## 2023-04-14 MED ORDER — DEXTROSE-SODIUM CHLORIDE 5-0.45 % IV SOLN: INTRAVENOUS | Status: DC

## 2023-04-14 MED ORDER — MELATONIN 3 MG PO TABS
3.0000 mg | ORAL_TABLET | Freq: Every evening | ORAL | Status: DC | PRN
  Filled 2023-04-14: qty 1

## 2023-04-14 MED ORDER — STROKE: EARLY STAGES OF RECOVERY BOOK
Freq: Once | Status: AC
Start: 2023-04-14 — End: 2023-04-14

## 2023-04-14 MED ORDER — ATORVASTATIN CALCIUM 80 MG PO TABS
80.0000 mg | ORAL_TABLET | Freq: Every day | ORAL | Status: DC
  Administered 2023-04-14 – 2023-04-16 (×3): 80 mg via ORAL
  Filled 2023-04-14 (×3): qty 1

## 2023-04-14 MED ORDER — METOCLOPRAMIDE HCL 5 MG/ML IJ SOLN
5.0000 mg | Freq: Four times a day (QID) | INTRAMUSCULAR | Status: DC | PRN
  Administered 2023-04-14 (×2): 5 mg via INTRAVENOUS
  Filled 2023-04-14 (×2): qty 2

## 2023-04-14 MED ORDER — ONDANSETRON HCL 4 MG/2ML IJ SOLN: 4.0000 mg | Freq: Four times a day (QID) | INTRAMUSCULAR | Status: DC | PRN

## 2023-04-14 MED ORDER — ONDANSETRON HCL 4 MG/2ML IJ SOLN
4.0000 mg | Freq: Once | INTRAMUSCULAR | Status: AC
  Administered 2023-04-14: 4 mg via INTRAVENOUS
  Filled 2023-04-14: qty 2

## 2023-04-14 MED ORDER — TICAGRELOR 90 MG PO TABS
90.0000 mg | ORAL_TABLET | Freq: Two times a day (BID) | ORAL | Status: DC
  Administered 2023-04-14 – 2023-04-17 (×7): 90 mg via ORAL
  Filled 2023-04-14 (×7): qty 1

## 2023-04-14 MED ORDER — ACETAMINOPHEN 650 MG RE SUPP: 650.0000 mg | Freq: Four times a day (QID) | RECTAL | Status: DC | PRN

## 2023-04-14 NOTE — ED Notes (Signed)
Pt wants nurse to pull from IV, nurse notified.

## 2023-04-14 NOTE — Progress Notes (Signed)
SLP Cancellation Note  Patient Details Name: Christina Orozco MRN: [id number] DOB: 04/07/1971   Cancelled treatment:        Pt attempted to be seen this am in ED, per nursing reports the pt had difficulty sleeping t/o and had just gone to bed. SLP opted to try again at another time to allow the pt to rest before transition into a room.  Dione Housekeeper M.S. CCC-SLP

## 2023-04-14 NOTE — ED Notes (Signed)
Cleaned patient up at bedside. New gown placed on her due to her personal one being wet. Replaced EKG stickers on her. New sheets and brief on her. Cleaned her up after she had an episodes of vomiting. Call light in hand. PT and OT now at bedside

## 2023-04-14 NOTE — Progress Notes (Signed)
Inpatient Rehab Admissions Coordinator Note:   Per therapy patient was screened for CIR candidacy by Kamauri Kathol Luvenia Starch, CCC-SLP. At this time, pt is under observation status. Pt may not have the medical necessity to warrant an inpatient rehab stay if they remain observation. If status were to change to inpatient, [facility] will screen for candidacy.   Christina Phoenix, MS, CCC-SLP Admissions Coordinator [phone] 04/14/23 11:46 AM

## 2023-04-14 NOTE — ED Notes (Signed)
ED TO INPATIENT HANDOFF REPORT  ED Nurse Name and Phone #: Ruddy Swire [phone]  S Name/Age/Gender Christina Orozco 52 y.o. female Room/Bed: 011C/011C  Code Status   Code Status: Full Code  Home/SNF/Other Home Patient oriented to: self, place, time, and situation Is this baseline? Yes   Triage Complete: Triage complete  Chief Complaint Acute ischemic stroke [facility]) [I63.9]  Triage Note Pt presents via EMS c/o syncopal episode at home while ambulating to the restroom. EMS reports pt denies head/neck/back pain. Pt c/o nausea. Pt has hx of CVA with right sided deficit. Currently A&O x4 per EMS.    Allergies Allergies  Allergen Reactions   Lisinopril Swelling and Other (See Comments)    Facial swelling  angioedema   Penicillins Hives    Has patient had a PCN reaction causing immediate rash, facial/tongue/throat swelling, SOB or lightheadedness with hypotension: yes Has patient had a PCN reaction causing severe rash involving mucus membranes or skin necrosis: No Has patient had a PCN reaction that required hospitalization No Has patient had a PCN reaction occurring within the last 10 years: No If all of the above answers are "NO", then may proceed wit Rash on buttock that makes sitting very difficult   Quetiapine Other (See Comments)    Unknown    Gabapentin Swelling and Other (See Comments)    "I thought I was having another stroke." dizziness    Level of Care/Admitting Diagnosis ED Disposition     ED Disposition  Admit   Condition  --   Comment  Hospital Area: [facility] [[id number]]  Level of Care: Telemetry Medical [[id number]]  May place patient in observation at [facility] or [facility] if equivalent level of care is available:: No  Covid Evaluation: Asymptomatic - no recent exposure (last 10 days) testing not required  Diagnosis: Acute ischemic stroke [facility]) [[id number]]  Admitting Physician: Angie Fava [[id number]]  Attending Physician: Angie Fava [[id number]]          B Medical/Surgery History Past Medical History:  Diagnosis Date   Anemia    severe   Anxiety    Asthma    CKD (chronic kidney disease)    stage IV   DDD (degenerative disc disease), cervical    Depression    Diabetes mellitus (HCC)    Dislocated shoulder    right   GERD (gastroesophageal reflux disease)    Headache    migraines (about once a month)   History of kidney stones    Hypertension    Neuropathy    Peripheral vascular disease (HCC)    Pneumonia    Stroke [facility])    Past Surgical History:  Procedure Laterality Date   ADENOIDECTOMY     APPENDECTOMY     CERVICAL SPINE SURGERY  06/2012   C5-C6   EYE SURGERY     left eye surgery    FOOT SURGERY     KENALOG INJECTION Left 03/10/2021   Procedure: Intravitreal Avastin Injection;  Surgeon: Stephannie Li, MD;  Location: [facility] OR;  Service: Ophthalmology;  Laterality: Left;   LOOP RECORDER INSERTION N/A 08/27/2017   Procedure: LOOP RECORDER INSERTION;  Surgeon: Thurmon Fair, MD;  Location: [facility] INVASIVE CV LAB;  Service: Cardiovascular;  Laterality: N/A;   MEMBRANE PEEL Left 03/10/2021   Procedure: MEMBRANE PEEL;  Surgeon: Stephannie Li, MD;  Location: [facility] OR;  Service: Ophthalmology;  Laterality: Left;   PARS PLANA VITRECTOMY Left 03/10/2021   Procedure: TWENTY-FIVE GAUGE PARS PLANA VITRECTOMY;  Surgeon:  Stephannie Li, MD;  Location: [facility] OR;  Service: Ophthalmology;  Laterality: Left;   PHOTOCOAGULATION WITH LASER Left 03/10/2021   Procedure: PHOTOCOAGULATION WITH LASER;  Surgeon: Stephannie Li, MD;  Location[facility] OR;  Service: Ophthalmology;  Laterality: Left;   SHOULDER SURGERY     TEE WITHOUT CARDIOVERSION N/A 08/27/2017   Procedure: TRANSESOPHAGEAL ECHOCARDIOGRAM (TEE);  Surgeon: Thurmon Fair, MD;  Location: [facility] ENDOSCOPY;  Service: Cardiovascular;  Laterality: N/A;   TONSILLECTOMY       A IV Location/Drains/Wounds Patient Lines/Drains/Airways Status     Active Line/Drains/Airways      Name Placement date Placement time Site Days   Peripheral IV 04/13/23 18 G Anterior;Right Wrist 04/13/23  2200  Wrist  1            Intake/Output Last 24 hours  Intake/Output Summary (Last 24 hours) at 04/14/2023 0950 Last data filed at 04/14/2023 0243 Gross per 24 hour  Intake 50 ml  Output --  Net 50 ml    Labs/Imaging Results for orders placed or performed during the hospital encounter of 04/13/23 (from the past 48 hour(s))  Basic metabolic panel     Status: Abnormal   Collection Time: 04/13/23 10:30 PM  Result Value Ref Range   Sodium 141 135 - 145 mmol/L   Potassium 3.7 3.5 - 5.1 mmol/L   Chloride 111 98 - 111 mmol/L   CO2 18 (L) 22 - 32 mmol/L   Glucose, Bld 219 (H) 70 - 99 mg/dL    Comment: Glucose reference range applies only to samples taken after fasting for at least 8 hours.   BUN 34 (H) 6 - 20 mg/dL   Creatinine, Ser 1.61 (H) 0.44 - 1.00 mg/dL   Calcium 9.4 8.9 - 09.6 mg/dL   GFR, Estimated 21 (L) >60 mL/min    Comment: (NOTE) Calculated using the CKD-EPI Creatinine Equation (2021)    Anion gap 12 5 - 15    Comment: Performed at [facility], [address] [place], [place] [address]  CBC     Status: Abnormal   Collection Time: 04/13/23 10:30 PM  Result Value Ref Range   WBC 10.3 4.0 - 10.5 K/uL   RBC 3.26 (L) 3.87 - 5.11 MIL/uL   Hemoglobin 9.3 (L) 12.0 - 15.0 g/dL   HCT 98.1 (L) 19.1 - 47.8 %   MCV 88.3 80.0 - 100.0 fL   MCH 28.5 26.0 - 34.0 pg   MCHC 32.3 30.0 - 36.0 g/dL   RDW 29.5 62.1 - 30.8 %   Platelets 273 150 - 400 K/uL   nRBC 0.0 0.0 - 0.2 %    Comment: Performed at [facility], [address], [place], [place] [address]  CBG monitoring, ED     Status: Abnormal   Collection Time: 04/13/23 11:01 PM  Result Value Ref Range   Glucose-Capillary 171 (H) 70 - 99 mg/dL    Comment: Glucose reference range applies only to samples taken after fasting for at least 8 hours.  CBG monitoring, ED     Status: Abnormal   Collection  Time: 04/14/23  7:21 AM  Result Value Ref Range   Glucose-Capillary 146 (H) 70 - 99 mg/dL    Comment: Glucose reference range applies only to samples taken after fasting for at least 8 hours.   MR BRAIN WO CONTRAST  Result Date: 04/14/2023 CLINICAL DATA:  Initial evaluation for acute visual loss, binocular. EXAM: MRI HEAD WITHOUT CONTRAST TECHNIQUE: Multiplanar, multiecho pulse sequences of  the brain and surrounding structures were obtained without intravenous contrast. COMPARISON:  Head CT from earlier the same day as well as previous MRI from 10/28/2022. FINDINGS: Brain: Mildly advanced cerebral atrophy for age. Patchy and confluent T2/FLAIR hyperintensity involving the periventricular and deep white matter both cerebral hemispheres. Patchy involvement of the thalami, with extensive involvement of the brainstem/pons. Associated wallerian degeneration involving the bilateral middle cerebellar peduncles again noted. Findings are most likely related to severe chronic microvascular ischemic disease. Multiple superimposed remote lacunar infarcts present about the bilateral basal ganglia, thalami, and pons. 7 mm acute ischemic subcortical infarct present at the posterior right frontoparietal region (series 5, image 85). Additional probable punctate acute ischemic infarct present at the right splenium, best seen on coronal DWI sequence (series 7, image 47). No associated hemorrhage or mass effect. No other evidence for acute or subacute ischemia. No acute intracranial hemorrhage. Multiple chronic micro hemorrhages noted, most pronounced about the brainstem, most characteristic of chronic poorly controlled hypertension. No mass lesion, mass effect, or midline shift. No hydrocephalus or extra-axial fluid collection. Pituitary gland and suprasellar region within normal limits. Vascular: Major intracranial vascular flow voids are maintained. Skull and upper cervical spine: Craniocervical junction within normal limits.  Bone marrow signal intensity normal. Small lipoma noted at the right frontal scalp. Sinuses/Orbits: Prior bilateral ocular lens replacement. Mild mucosal thickening noted about the sphenoethmoidal and maxillary sinuses. Small to moderate left mastoid effusion, of doubtful significance. Other: None. IMPRESSION: 1. 7 mm acute ischemic subcortical infarct at the posterior right frontoparietal region. Additional punctate acute ischemic infarct at the right splenium. No associated hemorrhage or mass effect. 2. Underlying severe chronic microvascular ischemic disease with multiple superimposed remote lacunar infarcts about the bilateral basal ganglia, thalami, and pons. 3. Multiple chronic micro hemorrhages, most pronounced about the brainstem with wallerian degeneration. Findings are most characteristic of chronic poorly controlled hypertension. Electronically Signed   By: Rise Mu M.D.   On: 04/14/2023 03:36   CT HEAD WO CONTRAST ( )  Result Date: 04/14/2023 CLINICAL DATA:  Syncope/presyncope EXAM: CT HEAD WITHOUT CONTRAST TECHNIQUE: Contiguous axial images were obtained from the base of the skull through the vertex without intravenous contrast. RADIATION DOSE REDUCTION: This exam was performed according to the departmental dose-optimization program which includes automated exposure control, adjustment of the mA and/or kV according to patient size and/or use of iterative reconstruction technique. COMPARISON:  10/30/2022 FINDINGS: Brain: No evidence of acute infarction, hemorrhage, hydrocephalus, extra-axial collection or mass lesion/mass effect. Subcortical white matter and periventricular small vessel ischemic changes. Vascular: Intracranial atherosclerosis. Skull: Normal. Negative for fracture or focal lesion. Sinuses/Orbits: The visualized paranasal sinuses are essentially clear. The mastoid air cells are unopacified. Other: None. IMPRESSION: No acute intracranial abnormality. Small vessel ischemic  changes. Electronically Signed   By: Charline Bills M.D.   On: 04/14/2023 01:27    Pending Labs Unresulted Labs (From admission, onward)     Start     Ordered   04/14/23 0631  Vitamin B12  Add-on,   AD        04/14/23 0630   04/14/23 0631  CK  Add-on,   AD        04/14/23 0630   04/14/23 0631  TSH  Add-on,   AD        04/14/23 0630   04/14/23 0555  Lipid panel  (Labs)  Add-on,   AD        04/14/23 0555   04/14/23 0555  Hemoglobin A1c  (Labs)  Add-on,   AD       Comments: To assess prior glycemic control    04/14/23 0555   04/14/23 0553  CBC with Differential/Platelet  Once,   R        04/14/23 0552   04/14/23 0553  Comprehensive metabolic panel  Once,   R        04/14/23 0552   04/14/23 0553  Magnesium  Once,   R        04/14/23 0552   04/13/23 2229  Urinalysis, Routine w reflex microscopic -Urine, Clean Catch  Once,   URGENT       Question:  Specimen Source  Answer:  Urine, Clean Catch   04/13/23 2228            Vitals/Pain Today's Vitals   04/14/23 0815 04/14/23 0900 04/14/23 0921 04/14/23 0924  BP: (!) 171/90 (!) 178/89  (!) 178/89  Pulse: 90 89  88  Resp:    20  Temp:   98.2 F (36.8 C) 98.2 F (36.8 C)  TempSrc:   Oral Oral  SpO2: 100% 100%  100%  PainSc:        Isolation Precautions No active isolations  Medications Medications  acetaminophen (TYLENOL) tablet 650 mg (has no administration in time range)    Or  acetaminophen (TYLENOL) suppository 650 mg (has no administration in time range)  melatonin tablet 3 mg (has no administration in time range)  labetalol (NORMODYNE) injection 10 mg (has no administration in time range)  LORazepam (ATIVAN) injection 0.5 mg (has no administration in time range)  aspirin EC tablet 81 mg (81 mg Oral Given 04/14/23 0929)  atorvastatin (LIPITOR) tablet 80 mg (has no administration in time range)  montelukast (SINGULAIR) tablet 10 mg (has no administration in time range)  pantoprazole (PROTONIX) EC tablet 40 mg (40  mg Oral Given 04/14/23 0930)  ticagrelor (BRILINTA) tablet 90 mg (90 mg Oral Given 04/14/23 0930)  insulin aspart (novoLOG) injection 0-9 Units (1 Units Subcutaneous Given 04/14/23 0722)  albuterol (PROVENTIL) (2.5 MG/3ML) 0.083% nebulizer solution 2.5 mg (has no administration in time range)  ondansetron (ZOFRAN) injection 4 mg (4 mg Intravenous Given 04/14/23 0208)  famotidine (PEPCID) IVPB 20 mg premix (0 mg Intravenous Stopped 04/14/23 0243)  pantoprazole (PROTONIX) injection 40 mg (40 mg Intravenous Given 04/14/23 0442)  metoCLOPramide (REGLAN) injection 10 mg (10 mg Intravenous Given 04/14/23 0443)   stroke: early stages of recovery book ( Does not apply Given 04/14/23 1610)    Mobility non-ambulatory     Focused Assessments    R Recommendations: See Admitting Provider Note  Report given to:   Additional Notes:

## 2023-04-14 NOTE — ED Provider Notes (Signed)
[place] EMERGENCY DEPARTMENT AT [facility] Provider Note   CSN: [id number] Arrival date & time: 04/13/23  2206     History {Add pertinent medical, surgical, social history, OB history to HPI:1} Chief Complaint  Patient presents with   Loss of Consciousness    Christina Orozco is a 52 y.o. female.  52 y/o female with hx of T2DM, CKD stage IV, reported diabetic retinopathy, and recurrent strokes presents to the ED for evaluation of vision changes. Reports her vision began "blinking" around 1700 today. She laid down to try and feel better, but when she got up around 1900 she began to feel generally weak and experience nausea/vomiting. Also had urgency of stool. Was on the way to the bathroom to try and change her brief, but ended up on the floor and was unable to get up. Cannot specify if she passed out. Reports she is currently experiencing double vision with ongoing nausea, heaving. No headache, chest pain, SOB, abdominal pain, fevers. Has been compliant with her daily medications. Is on ASA and Brilinta, but no chronic anticoagulant. Last acute CVA noted on outpatient MRI brain in September 2024. Has residual R sided deficits associated with prior strokes.  The history is provided by the patient. No language interpreter was used.  Loss of Consciousness      Home Medications Prior to Admission medications   Medication Sig Start Date End Date Taking? Authorizing Provider  albuterol (PROAIR HFA) 108 (90 Base) MCG/ACT inhaler INHALE 1 OR 2 PUFFS INTO THE LUNGS EVERY 6 HOURS AS NEEDED FOR WHEEZING OR SHORTNESS OF BREATH 04/12/18   Angiulli, Mcarthur Rossetti, PA-C  amLODipine (NORVASC) 10 MG tablet TAKE 1 TABLET (10 MG TOTAL) BY MOUTH DAILY. (AM) 03/05/23   Alver Sorrow, NP  aspirin EC 81 MG tablet Take 81 mg by mouth 2 (two) times daily. Swallow whole.    [provider]  atorvastatin (LIPITOR) 80 MG tablet Take 1 tablet (80 mg total) by mouth at bedtime. 10/05/22   Alver Sorrow, NP  carvedilol (COREG) 12.5 MG tablet Take 1 tablet (12.5 mg total) by mouth 2 (two) times daily with a meal. 10/12/22   Alver Sorrow, NP  FLUoxetine (PROZAC) 20 MG capsule Take 20 mg by mouth daily.    [provider]  fluticasone (FLONASE) 50 MCG/ACT nasal spray Place 2 sprays into both nostrils daily. 05/01/22   [provider]  montelukast (SINGULAIR) 10 MG tablet Take 10 mg by mouth at bedtime.    [provider]  ondansetron (ZOFRAN-ODT) 4 MG disintegrating tablet Take 1 tablet (4 mg total) by mouth every 8 (eight) hours as needed for nausea. Patient not taking: Reported on 04/02/2023 10/26/22   Garlon Hatchet, PA-C  OZEMPIC, 0.25 OR 0.5 MG/DOSE, 2 MG/3ML SOPN Inject 0.5 mg into the skin See admin instructions. Inject 0.5 mg subcutaneous every 10 days 04/14/22   [provider]  pantoprazole (PROTONIX) 40 MG tablet Take 1 tablet (40 mg total) by mouth daily. 12/30/21   Love, Evlyn Kanner, PA-C  ticagrelor (BRILINTA) 90 MG TABS tablet Take 90 mg by mouth 2 (two) times daily.    [provider]      Allergies    Lisinopril, Penicillins, Quetiapine, and Gabapentin    Review of Systems   Review of Systems  Cardiovascular:  Positive for syncope.  Ten systems reviewed and are negative for acute change, except as noted in the HPI.    Physical Exam Updated Vital  Signs BP (!) 173/85   Pulse 87   Temp 97.7 F (36.5 C) (Oral)   Resp (!) 21   LMP 05/22/2018 (Within Days)   SpO2 100%   Physical Exam Vitals and nursing note reviewed.  Constitutional:      General: She is not in acute distress.    Appearance: She is well-developed. She is not diaphoretic.     Comments: Ill appearing, but nontoxic. Intermittently heaving.  HENT:     Head: Normocephalic and atraumatic.  Eyes:     General: No scleral icterus.    Extraocular Movements: EOM normal.     Conjunctiva/sclera: Conjunctivae normal.     Comments: Anisocoria; R>L. Reactive,  but sluggish.  Cardiovascular:     Rate and Rhythm: Normal rate and regular rhythm.     Pulses: Normal pulses.  Pulmonary:     Effort: Pulmonary effort is normal. No respiratory distress.  Musculoskeletal:        General: Normal range of motion.     Cervical back: Normal range of motion.  Skin:    General: Skin is warm and dry.     Coloration: Skin is not pale.     Findings: No erythema or rash.  Neurological:     Mental Status: She is alert and oriented to person, place, and time.     Comments: GCS 15. Speech is clear, goal oriented. GCS 15. No facial drooping, tongue midline. R sided weakness; hx of residual deficits.   Psychiatric:        Mood and Affect: Mood and affect normal.        Behavior: Behavior normal.     ED Results / Procedures / Treatments   Labs (all labs ordered are listed, but only abnormal results are displayed) Labs Reviewed  BASIC METABOLIC PANEL - Abnormal; Notable for the following components:      Result Value   CO2 18 (*)    Glucose, Bld 219 (*)    BUN 34 (*)    Creatinine, Ser 2.66 (*)    GFR, Estimated 21 (*)    All other components within normal limits  CBC - Abnormal; Notable for the following components:   RBC 3.26 (*)    Hemoglobin 9.3 (*)    HCT 28.8 (*)    All other components within normal limits  CBG MONITORING, ED - Abnormal; Notable for the following components:   Glucose-Capillary 171 (*)    All other components within normal limits  URINALYSIS, ROUTINE W REFLEX MICROSCOPIC    EKG EKG Interpretation Date/Time:  Friday April 13 2023 22:29:29 EDT Ventricular Rate:  91 PR Interval:  184 QRS Duration:  86 QT Interval:  418 QTC Calculation: 514 R Axis:   94  Text Interpretation: Normal sinus rhythm Rightward axis Anterior infarct , age undetermined Prolonged QT Abnormal ECG When compared with ECG of 27-Oct-2022 21:41, PREVIOUS ECG IS PRESENT Confirmed by Gilda Crease [phone]) on 04/14/2023 12:40:48 AM  Radiology No  results found.  Procedures Procedures  {Document cardiac monitor, telemetry assessment procedure when appropriate:1}  Medications Ordered in ED Medications  ondansetron (ZOFRAN) injection 4 mg (has no administration in time range)  famotidine (PEPCID) IVPB 20 mg premix (has no administration in time range)    ED Course/ Medical Decision Making/ A&P   {   Click here for ABCD2, HEART and other calculatorsREFRESH Note before signing :1}  Medical Decision Making Amount and/or Complexity of Data Reviewed Labs: ordered. Radiology: ordered.  Risk Prescription drug management.   ***  {Document critical care time when appropriate:1} {Document review of labs and clinical decision tools ie heart score, Chads2Vasc2 etc:1}  {Document your independent review of radiology images, and any outside records:1} {Document your discussion with family members, caretakers, and with consultants:1} {Document social determinants of health affecting pt's care:1} {Document your decision making why or why not admission, treatments were needed:1} Final Clinical Impression(s) / ED Diagnoses Final diagnoses:  None    Rx / DC Orders ED Discharge Orders     None

## 2023-04-14 NOTE — ED Notes (Signed)
Patient transported to MRI at this time. 

## 2023-04-14 NOTE — ED Notes (Signed)
Patient unable to stand to do proper orthostatic vitals

## 2023-04-14 NOTE — H&P (Signed)
History and Physical      Christina Orozco WGN:[id number] DOB: [birth year] DOA: 04/13/2023; DOS: 04/14/2023  PCP: Kerin Salen, PA-C  Patient coming from: home   I have personally briefly reviewed patient's old medical records in [facility]  Chief Complaint: Diplopia  HPI: Christina Orozco is a 52 y.o. female with medical history significant for recurrent ischemic CVAs, with residual right hemiparesis, type 2 diabetes mellitus, CKD stage IV associated baseline creatinine 2.5-3.3, essential pretension, anemia of chronic kidney disease associated baseline hemoglobin 8-12, who is admitted to [facility] on 04/13/2023 with acute ischemic CVA after presenting from home to [facility] ED complaining of diplopia.   The patient reports sudden onset of double vision in the bilateral eyes starting at 1700 on 04/13/2023, and notes that these symptoms are still present.  This has been associated with intermittent nausea resulting in 2-3 episodes of nonbloody, nonbilious emesis.  Relative to her residual right hemiparesis stemming from a history of recurrent ischemic CVAs, she denies any acute focal weakness, nor any acute focal numbness, paresthesias, word finding difficulties, slurring of speech, facial droop.  She also denies any recent chest pain, shortness of breath.  Over the last day, she reports generalized weakness.  Denies any associated subjective fever, chills, rigors, or generalized myalgias.  In the setting of her history of recurrent ischemic strokes, she reportedly underwent TTE in September 2024, as an outpatient.  Her medical history is notable for type 2 diabetes mellitus with most recent hemoglobin A1c found to be 6.2% in May 2024.  She also has a history of essential hypertension.  Conveys that she is a lifelong non-smoker, denies any known history of obstructive sleep apnea or paroxysmal atrial fibrillation.  Reports good compliance with outpatient dual antiplatelet  therapy, which consists of aspirin 81 mg p.o. twice daily as well as Brilinta p.o. twice daily.  She is also on atorvastatin 80 mg p.o. daily, noting good associated compliance with this statin.   patient noted to have arrived in [facility] ED at 2206 on 04/13/23 .     ED Course:  Vital signs in the ED were notable for the following: Afebrile; heart rates in the 80s to 90s; systolic blood pressures 160s to 180s; respiratory rate 14-22, oxygen saturation 99 to 100% on room air.  Labs were notable for the following: BMP notable for sodium 141, creatinine 2.66 compared to most recent prior serum creatinine data point of 2.89 on 10/30/2022, glucose 219.  CBC notable for white cell count 10,300, hemoglobin 9.3 associated normocytic/Norocarp properties as well as nonelevated RDW and relative to most recent prior hemoglobin data point of 8.6 on 10/31/2022 completely 273.  Urinalysis ordered, Therazole currently pending.  Per my interpretation, EKG in ED demonstrated the following: Sinus rhythm with a rate 91, prolonged QTc of 514, no evidence of T wave or ST changes, Cleen evidence of ST elevation.  Imaging in the ED, per corresponding formal radiology read, was notable for the following: Noncontrast CT head showed no evidence of acute intracranial process, Cleen evidence of acute hemorrhage or any evidence of acute infarct.  MRI brain, relative to most recent prior MRI brain from 10/28/2022, showed 7 mm acute ischemic subcortical infarct of the posterior right frontoparietal region in addition to a punctate acute ischemic infarct at the right splenium, without any associated hemorrhage or mass effect noted.  EDP d/w on-call neurology, Dr. Otelia Limes, who conveyed that the patient is not a tPA candidate, and did not recommend pursuit of  CTA head and neck, she is also not a candidate for thrombectomy.  Additionally, Dr. Otelia Limes did not feel that repeat echocardiogram is necessary, given outpatient echocardiogram in  September 2024, and conveyed that from an antiplatelet, antilipid standpoint that the patient is already optimized on dual antiplatelet therapy and atorvastatin 80 mg p.o. daily. He conveyed that inpatient neurology is available as needed, and recommended follow-up with patient's existing outpatient neurologist.   While in the ED, the following were administered: Reglan 10 mg IV x 1, Zofran 4 mg IV x 1, Protonix 40 mg IV x 1, Pepcid 20 mg IV x 1.  Subsequently, the patient was admitted for further evaluation management of presenting acute ischemic CVA, with patient also complaining of generalized weakness in the absence of any new acute focal weakness, while EKG was notable for evidence of QTc prolongation.     Review of Systems: As per HPI otherwise 10 point review of systems negative.   Past Medical History:  Diagnosis Date   Anemia    severe   Anxiety    Asthma    CKD (chronic kidney disease)    stage IV   DDD (degenerative disc disease), cervical    Depression    Diabetes mellitus (HCC)    Dislocated shoulder    right   GERD (gastroesophageal reflux disease)    Headache    migraines (about once a month)   History of kidney stones    Hypertension    Neuropathy    Peripheral vascular disease (HCC)    Pneumonia    Stroke [facility])     Past Surgical History:  Procedure Laterality Date   ADENOIDECTOMY     APPENDECTOMY     CERVICAL SPINE SURGERY  06/2012   C5-C6   EYE SURGERY     left eye surgery    FOOT SURGERY     KENALOG INJECTION Left 03/10/2021   Procedure: Intravitreal Avastin Injection;  Surgeon: Stephannie Li, MD;  Location: [facility] OR;  Service: Ophthalmology;  Laterality: Left;   LOOP RECORDER INSERTION N/A 08/27/2017   Procedure: LOOP RECORDER INSERTION;  Surgeon: Thurmon Fair, MD;  Location: [facility] INVASIVE CV LAB;  Service: Cardiovascular;  Laterality: N/A;   MEMBRANE PEEL Left 03/10/2021   Procedure: MEMBRANE PEEL;  Surgeon: Stephannie Li, MD;  Location: [facility] OR;   Service: Ophthalmology;  Laterality: Left;   PARS PLANA VITRECTOMY Left 03/10/2021   Procedure: TWENTY-FIVE GAUGE PARS PLANA VITRECTOMY;  Surgeon: Stephannie Li, MD;  Location: [facility] OR;  Service: Ophthalmology;  Laterality: Left;   PHOTOCOAGULATION WITH LASER Left 03/10/2021   Procedure: PHOTOCOAGULATION WITH LASER;  Surgeon: Stephannie Li, MD;  Location: [facility] OR;  Service: Ophthalmology;  Laterality: Left;   SHOULDER SURGERY     TEE WITHOUT CARDIOVERSION N/A 08/27/2017   Procedure: TRANSESOPHAGEAL ECHOCARDIOGRAM (TEE);  Surgeon: Thurmon Fair, MD;  Location: [facility] ENDOSCOPY;  Service: Cardiovascular;  Laterality: N/A;   TONSILLECTOMY      Social History:  reports that she has never smoked. She has never used smokeless tobacco. She reports that she does not drink alcohol and does not use drugs.   Allergies  Allergen Reactions   Lisinopril Swelling and Other (See Comments)    Facial swelling  angioedema   Penicillins Hives    Has patient had a PCN reaction causing immediate rash, facial/tongue/throat swelling, SOB or lightheadedness with hypotension: yes Has patient had a PCN reaction causing severe rash involving mucus membranes or skin necrosis: No Has patient had a PCN reaction that  required hospitalization No Has patient had a PCN reaction occurring within the last 10 years: No If all of the above answers are "NO", then may proceed wit Rash on buttock that makes sitting very difficult   Quetiapine Other (See Comments)    Unknown    Gabapentin Swelling and Other (See Comments)    "I thought I was having another stroke." dizziness    Family History  Problem Relation Age of Onset   Vascular Disease Mother    CAD Mother    Heart failure Mother    Heart disease Other    Cancer Other        colon, stomach, pancreatic, lung   Diabetes Other    Breast cancer Sister 30   Seizures Maternal Uncle     Family history reviewed and not pertinent    Prior to Admission medications    Medication Sig Start Date End Date Taking? Authorizing Provider  albuterol (PROAIR HFA) 108 (90 Base) MCG/ACT inhaler INHALE 1 OR 2 PUFFS INTO THE LUNGS EVERY 6 HOURS AS NEEDED FOR WHEEZING OR SHORTNESS OF BREATH 04/12/18   Angiulli, Mcarthur Rossetti, PA-C  amLODipine (NORVASC) 10 MG tablet TAKE 1 TABLET (10 MG TOTAL) BY MOUTH DAILY. (AM) 03/05/23   Alver Sorrow, NP  aspirin EC 81 MG tablet Take 81 mg by mouth 2 (two) times daily. Swallow whole.    [provider]  atorvastatin (LIPITOR) 80 MG tablet Take 1 tablet (80 mg total) by mouth at bedtime. 10/05/22   Alver Sorrow, NP  carvedilol (COREG) 12.5 MG tablet Take 1 tablet (12.5 mg total) by mouth 2 (two) times daily with a meal. 10/12/22   Alver Sorrow, NP  FLUoxetine (PROZAC) 20 MG capsule Take 20 mg by mouth daily.    [provider]  fluticasone (FLONASE) 50 MCG/ACT nasal spray Place 2 sprays into both nostrils daily. 05/01/22   [provider]  montelukast (SINGULAIR) 10 MG tablet Take 10 mg by mouth at bedtime.    [provider]  ondansetron (ZOFRAN-ODT) 4 MG disintegrating tablet Take 1 tablet (4 mg total) by mouth every 8 (eight) hours as needed for nausea. Patient not taking: Reported on 04/02/2023 10/26/22   Garlon Hatchet, PA-C  OZEMPIC, 0.25 OR 0.5 MG/DOSE, 2 MG/3ML SOPN Inject 0.5 mg into the skin See admin instructions. Inject 0.5 mg subcutaneous every 10 days 04/14/22   [provider]  pantoprazole (PROTONIX) 40 MG tablet Take 1 tablet (40 mg total) by mouth daily. 12/30/21   Love, Evlyn Kanner, PA-C  ticagrelor (BRILINTA) 90 MG TABS tablet Take 90 mg by mouth 2 (two) times daily.    [provider]     Objective    Physical Exam: Vitals:   04/14/23 0145 04/14/23 0200 04/14/23 0215 04/14/23 0426  BP:    (!) 178/87  Pulse: 88 87 89 91  Resp: 17 (!) 22 (!) 27 20  Temp:    98.2 F (36.8 C)  TempSrc:    Axillary  SpO2: 100% 100% 100% 100%    General: appears to be  stated age; alert, oriented Skin: warm, dry, no rash Head:  AT/[place] Mouth:  Oral mucosa membranes appear moist, normal dentition Neck: supple; trachea midline Heart:  RRR; did not appreciate any M/R/G Lungs: CTAB, did not appreciate any wheezes, rales, or rhonchi Abdomen: + BS; soft, ND, NT Vascular: 2+ pedal pulses b/l; 2+ radial pulses b/l Extremities: no peripheral edema, no muscle wasting Neuro: 4/5 strength in  upper/lower extremity on right; 5/5 strength in upper/lower extremity on left; sensation intact in upper and lower extremities b/l; no evidence suggestive of slurred speech, dysarthria, or facial droop; Normal muscle tone. No tremors.     Labs on Admission: I have personally reviewed following labs and imaging studies  CBC: Recent Labs  Lab 04/13/23 2230  WBC 10.3  HGB 9.3*  HCT 28.8*  MCV 88.3  PLT 273   Basic Metabolic Panel: Recent Labs  Lab 04/13/23 2230  NA 141  K 3.7  CL 111  CO2 18*  GLUCOSE 219*  BUN 34*  CREATININE 2.66*  CALCIUM 9.4   GFR: Estimated Creatinine Clearance: 22.5 mL/min (A) (by C-G formula based on SCr of 2.66 mg/dL (H)). Liver Function Tests: No results for input(s): "AST", "ALT", "ALKPHOS", "BILITOT", "PROT", "ALBUMIN" in the last 168 hours. No results for input(s): "LIPASE", "AMYLASE" in the last 168 hours. No results for input(s): "AMMONIA" in the last 168 hours. Coagulation Profile: No results for input(s): "INR", "PROTIME" in the last 168 hours. Cardiac Enzymes: No results for input(s): "CKTOTAL", "CKMB", "CKMBINDEX", "TROPONINI" in the last 168 hours. BNP (last 3 results) No results for input(s): "PROBNP" in the last 8760 hours. HbA1C: No results for input(s): "HGBA1C" in the last 72 hours. CBG: Recent Labs  Lab 04/13/23 2301  GLUCAP 171*   Lipid Profile: No results for input(s): "CHOL", "HDL", "LDLCALC", "TRIG", "CHOLHDL", "LDLDIRECT" in the last 72 hours. Thyroid Function Tests: No results for input(s): "TSH",  "T4TOTAL", "FREET4", "T3FREE", "THYROIDAB" in the last 72 hours. Anemia Panel: No results for input(s): "VITAMINB12", "FOLATE", "FERRITIN", "TIBC", "IRON", "RETICCTPCT" in the last 72 hours. Urine analysis:    Component Value Date/Time   COLORURINE YELLOW 10/27/2022 2357   APPEARANCEUR CLEAR 10/27/2022 2357   LABSPEC 1.014 10/27/2022 2357   PHURINE 5.5 10/27/2022 2357   GLUCOSEU NEGATIVE 10/27/2022 2357   HGBUR TRACE (A) 10/27/2022 2357   BILIRUBINUR NEGATIVE 10/27/2022 2357   BILIRUBINUR neg 07/15/2018 1128   KETONESUR NEGATIVE 10/27/2022 2357   PROTEINUR 100 (A) 10/27/2022 2357   UROBILINOGEN 0.2 07/15/2018 1128   UROBILINOGEN 0.2 04/23/2017 0920   NITRITE NEGATIVE 10/27/2022 2357   LEUKOCYTESUR NEGATIVE 10/27/2022 2357    Radiological Exams on Admission: MR BRAIN WO CONTRAST  Result Date: 04/14/2023 CLINICAL DATA:  Initial evaluation for acute visual loss, binocular. EXAM: MRI HEAD WITHOUT CONTRAST TECHNIQUE: Multiplanar, multiecho pulse sequences of the brain and surrounding structures were obtained without intravenous contrast. COMPARISON:  Head CT from earlier the same day as well as previous MRI from 10/28/2022. FINDINGS: Brain: Mildly advanced cerebral atrophy for age. Patchy and confluent T2/FLAIR hyperintensity involving the periventricular and deep white matter both cerebral hemispheres. Patchy involvement of the thalami, with extensive involvement of the brainstem/pons. Associated wallerian degeneration involving the bilateral middle cerebellar peduncles again noted. Findings are most likely related to severe chronic microvascular ischemic disease. Multiple superimposed remote lacunar infarcts present about the bilateral basal ganglia, thalami, and pons. 7 mm acute ischemic subcortical infarct present at the posterior right frontoparietal region (series 5, image 85). Additional probable punctate acute ischemic infarct present at the right splenium, best seen on coronal DWI  sequence (series 7, image 47). No associated hemorrhage or mass effect. No other evidence for acute or subacute ischemia. No acute intracranial hemorrhage. Multiple chronic micro hemorrhages noted, most pronounced about the brainstem, most characteristic of chronic poorly controlled hypertension. No mass lesion, mass effect, or midline shift. No hydrocephalus or extra-axial fluid collection. Pituitary gland and  suprasellar region within normal limits. Vascular: Major intracranial vascular flow voids are maintained. Skull and upper cervical spine: Craniocervical junction within normal limits. Bone marrow signal intensity normal. Small lipoma noted at the right frontal scalp. Sinuses/Orbits: Prior bilateral ocular lens replacement. Mild mucosal thickening noted about the sphenoethmoidal and maxillary sinuses. Small to moderate left mastoid effusion, of doubtful significance. Other: None. IMPRESSION: 1. 7 mm acute ischemic subcortical infarct at the posterior right frontoparietal region. Additional punctate acute ischemic infarct at the right splenium. No associated hemorrhage or mass effect. 2. Underlying severe chronic microvascular ischemic disease with multiple superimposed remote lacunar infarcts about the bilateral basal ganglia, thalami, and pons. 3. Multiple chronic micro hemorrhages, most pronounced about the brainstem with wallerian degeneration. Findings are most characteristic of chronic poorly controlled hypertension. Electronically Signed   By: Rise Mu M.D.   On: 04/14/2023 03:36   CT HEAD WO CONTRAST ( )  Result Date: 04/14/2023 CLINICAL DATA:  Syncope/presyncope EXAM: CT HEAD WITHOUT CONTRAST TECHNIQUE: Contiguous axial images were obtained from the base of the skull through the vertex without intravenous contrast. RADIATION DOSE REDUCTION: This exam was performed according to the departmental dose-optimization program which includes automated exposure control, adjustment of the mA  and/or kV according to patient size and/or use of iterative reconstruction technique. COMPARISON:  10/30/2022 FINDINGS: Brain: No evidence of acute infarction, hemorrhage, hydrocephalus, extra-axial collection or mass lesion/mass effect. Subcortical white matter and periventricular small vessel ischemic changes. Vascular: Intracranial atherosclerosis. Skull: Normal. Negative for fracture or focal lesion. Sinuses/Orbits: The visualized paranasal sinuses are essentially clear. The mastoid air cells are unopacified. Other: None. IMPRESSION: No acute intracranial abnormality. Small vessel ischemic changes. Electronically Signed   By: Charline Bills M.D.   On: 04/14/2023 01:27      Assessment/Plan   Principal Problem:   Acute ischemic stroke [facility]) Active Problems:   Mild intermittent asthma   Essential hypertension   CKD (chronic kidney disease) stage 4, GFR 15-29 ml/min (HCC)   DM2 (diabetes mellitus, type 2) (HCC)   History of anemia due to chronic kidney disease   Generalized weakness   Depression   Prolonged QT interval    #) Acute ischemic CVA: dx on the basis of acute onset of diplopia, nausea, vomiting at 1700 on 04/13/2023, with ensuing MRI brain showing acute ischemic subcortical infarct of the posterior right frontal parietal region as well as a punctate acute ischemic infarct at the right splenium, as further detailed above.  Persistence of the symptoms is noted, and last known well is noted to be 1700 on 04/13/2023.  This was after CT head showed no evidence of acute intracranial process, Cleen evidence of intracranial hemorrhage.  EDP d/w on-call neurology, Dr. Otelia Limes, who conveyed that the patient is not a tPA candidate, and did not recommend pursuit of CTA head and neck, she is also not a candidate for thrombectomy.  Additionally, Dr. Otelia Limes did not feel that repeat echocardiogram is necessary, given outpatient echocardiogram in September 2024, and conveyed that from an  antiplatelet, antilipid standpoint that the patient is already optimized on dual antiplatelet therapy and atorvastatin 80 mg p.o. daily. He conveyed that inpatient neurology is available as needed, and recommended follow-up with patient's existing outpatient neurologist.   Diagnosis history of recurrent ischemic CVAs, with multiple acute ischemic CVA risk factors including type 2 diabetes mellitus and essential hypertension.  As most recent prior hemoglobin A1c level was checked in May 2024, will repeat hemoglobin A1c level at this time as well as  lipid panel for further assessment of modifiable ischemic CVA risk factors.  Will allow for permissive hypertension for 48 hours following onset of acute focal neurologic deficits, with associated parameters further quantified below, and with period of observance of permissive hypertension to end at  1700 on 04/15/23.    Plan: Nursing bedside swallow evaluation x 1 now, and will not initiate oral medications or diet until the patient has passed this. Head of the bed at 30 degrees. Neuro checks per protocol. VS per protocol. Will allow for permissive hypertension for 48 hours during which will hold home antihypertensive medications, with prn IV labetalol ordered for SBP greater than 220 mmHg or DBP greater than 120 mmHg until  1700 on 04/15/23. Monitor on telemetry, including monitoring for atrial fibrillation as modifiable risk factor for acute ischemic CVA.   Per recommendation of neurology, will refrain from CTA head and neck as well as updated echocardiogram, as further detailed above.  check lipid panel and A1c. PT/OT/ST consults have been ordered for the morning.  Continue outpatient dual antiplatelet therapy with aspirin and Brilinta, as outlined above.  Continue outpatient atorvastatin 80 mg p.o. daily.                   #) Generalized weakness:  1 day  duration of generalized weakness, in the absence of any new acute focal neurologic  deficits, noting history of residual right hemiparesis stemming from her history of recurrent ischemic strokes. Potential contribution from 9 dehydration in the setting of recurrent nausea/vomiting.   No e/o infectious process at this time, although urinalysis is currently pending.   Will further eval for any additional contributions from endocrine/metabolic sources, as detailed below.   Plan: PT/OT consults ordered for the AM. Fall precautions. CMP/CBC in the AM. Check TSH, serum Mg level. Check B12, CPK level.  Follow-up result urinalysis.  Consideration for initiation of gentle IV fluids.                    #) QTc prolongation: Presenting EKG demonstrates QTc of  514  ms. outpatient medications that may be contributing to QTc prolongation: Prozac.    Plan: Monitor on telemetry.  Add-on serum magnesium level.  Hold outpatient Prozac.  Change existing prn IV Zofran prn IV Ativan for nausea/vomiting.                   #) Type 2 Diabetes Mellitus: documented history of such. Home insulin regimen: None. Home oral hypoglycemic agents: None.  On Ozempic as an outpatient.  Presenting blood sugar: 219. Most recent A1c noted to be 6.2% when checked in May 2024.  Will recheck hemoglobin A1c at this time as component of evaluation for modifiable ischemic CVA risk factors given this evening's presentation for acute ischemic CVA.  Plan: accuchecks QAC and HS with low dose SSI.  Hold outpatient Ozempic during this hospitalization.  Add on hemoglobin A1c level.                  #) CKD Stage 4: Documented history of such, with baseline creatinine 2.5-3.3, with presenting creatinine consistent with this baseline.    Plan: Monitor strict I's and O's and daily weights.  Attempt to avoid nephrotoxic agents.  CMP/magnesium level in the AM.                   #) Essential Hypertension: documented h/o such, with outpatient antihypertensive regimen  including Coreg, Norvasc.  SBP's in the ED  today: 160s to 180s mmHg. in the setting of her presenting acute ischemic stroke, will observe 48 hours of permissive hypertension from last known well of 1700 on 04/13/2023.  Plan: Close monitoring of subsequent BP via routine VS. hold home and hypertensive medications for now and observance of permissive hypertension, as above.  As needed IV labetalol for systolic blood pressure greater than 220 or diastolic blood pressure greater than 120, as further detailed above.  Monitor on telemetry.                    #) Depression: documented h/o such. On Prozac as outpatient.    Plan: In the setting of QTc prolongation observed on presenting EKG, will hold home Prozac for now.  Add-on serum magnesium level.                  #) Mild intermittent asthma: documented history thereof, without clinical e/o to suggest current exacerbation. Outpatient respiratory regimen includes prn albuterol inhaler as well as Singulair.   Plan: Check serum magnesium level. Continue home Singulair.  As needed albuterol nebulizer.                #) Anemia of chronic kidney disease: Documented history of such, a/w with baseline hgb range 8-12, with presenting hgb consistent with this range, in the absence of any overt evidence of active bleed.     Plan: Repeat CBC in the morning.        DVT prophylaxis: SCD's   Code Status: Full code Family Communication: none Disposition Plan: Per Rounding Team Consults called: EDP d/w on-call neurology, Dr. Otelia Limes, who conveyed that neurology is available as needed if additional consultation is warranted, as further detailed above;  Admission status: Observation     I SPENT GREATER THAN 75  MINUTES IN CLINICAL CARE TIME/MEDICAL DECISION-MAKING IN COMPLETING THIS ADMISSION.      Chaney Born Belissa Kooy DO [facility]  From 7PM - 7AM   04/14/2023, 6:32 AM

## 2023-04-14 NOTE — Evaluation (Signed)
Physical Therapy Evaluation Patient Details Name: Christina Orozco MRN: [id number] DOB: 06/11/1971 Today's Date: 04/14/2023  History of Present Illness  Pt is 52 yo female who presented 04/13/23 with diplopia and found to have acute  ischemic subcortical infarct of the posterior right frontal parietal region as well as a punctate acute ischemic infarct at the right splenium. Pt with hx including but not limited to DM, HTN, CKD, anemia, PVD, prior CVAs with R hemiparesis  Clinical Impression  Pt admitted with above diagnosis. Pt reports living alone (did note prior reports of living with dtr and significant other) at baseline but was trying to get PCA assistance.  Reports ambulates with a cane for short community distances, was no longer getting outpt PT but did have outpt OT, does report falls and difficulty with ADLs. Today, pt limited by significant dizziness/nausea/vomiting.  She reports R UE deficits have worsened, she needed mod A x 2 to transfer to EOB , unable to stand due to dizzy/nausea/vomiting.  Pt's eye movement difficult to fully assess due to difficulty keeping them open due to dizzy but with significant deficits including direction changing nystagmus, significantly uncoordinated movements, L eye drifting laterally, R eye slow tracking medially (see comments below). Pt is below her baseline and will benefit from PT. Pt currently with functional limitations due to the deficits listed below (see PT Problem List). Pt will benefit from acute skilled PT to increase their independence and safety with mobility to allow discharge. Patient will benefit from intensive inpatient follow up therapy, >3 hours/day at d/c.            If plan is discharge home, recommend the following: Two people to help with walking and/or transfers;Two people to help with bathing/dressing/bathroom   Can travel by private vehicle        Equipment Recommendations Other (comment) (defer to post acute)   Recommendations for Other Services       Functional Status Assessment Patient has had a recent decline in their functional status and demonstrates the ability to make significant improvements in function in a reasonable and predictable amount of time.     Precautions / Restrictions Precautions Precautions: Fall Precaution Comments: dizzy/N/V      Mobility  Bed Mobility Overal bed mobility: Needs Assistance Bed Mobility: Rolling, Supine to Sit, Sit to Supine Rolling: Mod assist   Supine to sit: Mod assist, +2 for physical assistance Sit to supine: +2 for physical assistance, Mod assist   General bed mobility comments: Mod A to roll; mod x 2 supine/sit (cues for focus points)    Transfers                   General transfer comment: not able to tolerate due to dizziness/N/V    Ambulation/Gait                  Stairs            Wheelchair Mobility     Tilt Bed    Modified Rankin (Stroke Patients Only) Modified Rankin (Stroke Patients Only) Pre-Morbid Rankin Score: Moderate disability Modified Rankin: Severe disability     Balance Overall balance assessment: Needs assistance Sitting-balance support: Single extremity supported Sitting balance-Leahy Scale: Poor Sitting balance - Comments: Mod A due to dizziness       Standing balance comment: did not attempt  Pertinent Vitals/Pain Pain Assessment Pain Assessment: No/denies pain    Home Living Family/patient expects to be discharged to:: Private residence Living Arrangements: Alone Available Help at Discharge:  (reports limited trying to get PCA but denied) Type of Home: Apartment Home Access: Level entry       Home Layout: One level Home Equipment: Tub bench;Grab bars - tub/shower;Rollator (4 wheels);Rolling Walker (2 wheels);Cane - single point;BSC/3in1 Additional Comments: trying to get a PCA back; did note over past year pt has reported  lives alone, lives with dtr, lives with significant other -potential questionable historian    Prior Function Prior Level of Function : Needs assist             Mobility Comments: Walking with rollator; limited community ambulator; reports some recent falls ADLs Comments: Has transportation services;struggling with ADLs since July but reports making it on her own     Extremity/Trunk Assessment   Upper Extremity Assessment Upper Extremity Assessment: Defer to OT evaluation (Worsened R deficits per pt)    Lower Extremity Assessment Lower Extremity Assessment: LLE deficits/detail;RLE deficits/detail RLE Deficits / Details: ROM: WFL; MMT: ankle 5/5, able to SLR, HIP ABD, LAQ at 3/5 (not able to further test due to dizziness/N/V) RLE Sensation: WNL LLE Deficits / Details: ROM: WFL; MMT: ankle 5/5, able to SLR, HIP ABD, LAQ at 3/5 (not able to further test due to dizziness/N/V) LLE Sensation: WNL    Cervical / Trunk Assessment Cervical / Trunk Assessment: Normal  Communication      Cognition Arousal: Alert Behavior During Therapy: Flat affect Overall Cognitive Status: No family/caregiver present to determine baseline cognitive functioning                                 General Comments: Pt following commands, oriented, seems to answer quetions appropriately but did note different reports of home support throughout the last year        General Comments General comments (skin integrity, edema, etc.): Pt reports double vision and struggles opening eyes during exam.  Not able to full assess due to pt's dizziness/N/V when eyes open but noting major deficits.  Pt with resting horizontal nystagmus, gaze induced nystagmus that changes direction, some rotational components at times.  Eye movements are significantly uncoordinated.  L eye deviates laterally, R eye slow to track medially, Bil also drifting up/down at times - again difficult to accurately assess due to very  dizzy/N/V with eyes open.  BP Supine 174/88 BP Sitting 160/92   Exercises     Assessment/Plan    PT Assessment Patient needs continued PT services  PT Problem List Decreased strength;Decreased coordination;Decreased range of motion;Decreased cognition;Decreased activity tolerance;Decreased knowledge of use of DME;Decreased balance;Decreased safety awareness;Decreased mobility;Decreased knowledge of precautions;Other (comment) (visual deficits; uncoordinated EOEM)       PT Treatment Interventions DME instruction;Therapeutic exercise;Gait training;Balance training;Stair training;Functional mobility training;Therapeutic activities;Patient/family education;Cognitive remediation;Neuromuscular re-education    PT Goals (Current goals can be found in the Care Plan section)  Acute Rehab PT Goals Patient Stated Goal: decrease dizziness PT Goal Formulation: With patient Time For Goal Achievement: 04/28/23 Potential to Achieve Goals: Good    Frequency Min 1X/week     Co-evaluation PT/OT/SLP Co-Evaluation/Treatment: Yes Reason for Co-Treatment: Complexity of the patient's impairments (multi-system involvement);For patient/therapist safety           AM-PAC PT "6 Clicks" Mobility  Outcome Measure Help needed turning from your back to your side  while in a flat bed without using bedrails?: A Lot Help needed moving from lying on your back to sitting on the side of a flat bed without using bedrails?: Total Help needed moving to and from a bed to a chair (including a wheelchair)?: Total Help needed standing up from a chair using your arms (e.g., wheelchair or bedside chair)?: Total Help needed to walk in hospital room?: Total Help needed climbing 3-5 steps with a railing? : Total 6 Click Score: 7    End of Session   Activity Tolerance: Other (comment) (limited due to N/V/dizzy) Patient left: in bed;with call bell/phone within reach (in ED) Nurse Communication: Mobility status PT Visit  Diagnosis: Other abnormalities of gait and mobility (R26.89);Dizziness and giddiness (R42);Muscle weakness (generalized) (M62.81);Hemiplegia and hemiparesis Hemiplegia - Right/Left: Right Hemiplegia - dominant/non-dominant: Dominant Hemiplegia - caused by: Cerebral infarction    Time: 0915-0943 PT Time Calculation (min) (ACUTE ONLY): 28 min   Charges:   PT Evaluation $PT Eval Moderate Complexity: 1 Mod   PT General Charges $$ ACUTE PT VISIT: 1 Visit         Anise Salvo, PT Acute Rehab [facility] [phone]   Rayetta Humphrey 04/14/2023, 10:37 AM

## 2023-04-14 NOTE — ED Notes (Signed)
Patient back from MRI.

## 2023-04-14 NOTE — Evaluation (Signed)
Occupational Therapy Evaluation Patient Details Name: Christina Orozco MRN: [id number] DOB: Apr 07, 1971 Today's Date: 04/14/2023   History of Present Illness Pt is 52 yo female who presented 04/13/23 with diplopia and found to have acute  ischemic subcortical infarct of the posterior right frontal parietal region as well as a punctate acute ischemic infarct at the right splenium. Pt with hx including but not limited to DM, HTN, CKD, anemia, PVD, prior CVAs with R hemiparesis   Clinical Impression   Pt admitted for above, she reports notable difference in ability to use RUE which now produces no volitional movement and has increased spasticity. Notified RN to order R resting hand splint to reduce risk of hand flexion contracture. Pt very limited by ongoing dizziness, could be a result of her visual deficits (see below). Pt needing significant assist with bed mobility and not able to tolerate OOB at this time. PTA pt reports managing on her own and attending outpt OT. Pt would benefit from continued acute skilled OT services to address deficits and help transition to next level of care. Patient has the potential to reach Mod I and demos the ability to tolerate 3 hours of therapy. Pt would benefit from an intensive rehab program to help maximize functional independence.       If plan is discharge home, recommend the following: Two people to help with walking and/or transfers;A lot of help with walking and/or transfers;A lot of help with bathing/dressing/bathroom;Assistance with cooking/housework;Assist for transportation;Assistance with feeding    Functional Status Assessment  Patient has had a recent decline in their functional status and demonstrates the ability to make significant improvements in function in a reasonable and predictable amount of time.  Equipment Recommendations  Other (comment) (TBD)    Recommendations for Other Services Rehab consult     Precautions / Restrictions  Precautions Precautions: Fall Precaution Comments: dizzy/N/V      Mobility Bed Mobility Overal bed mobility: Needs Assistance Bed Mobility: Rolling, Supine to Sit, Sit to Supine Rolling: Mod assist   Supine to sit: Mod assist, +2 for physical assistance Sit to supine: +2 for physical assistance, Mod assist   General bed mobility comments: Mod A to roll; mod x 2 supine/sit (cues for focus points)    Transfers                   General transfer comment: not able to tolerate due to dizziness/N/V      Balance Overall balance assessment: Needs assistance Sitting-balance support: Single extremity supported Sitting balance-Leahy Scale: Poor Sitting balance - Comments: Mod A due to dizziness       Standing balance comment: did not attempt                           ADL either performed or assessed with clinical judgement   ADL Overall ADL's : Needs assistance/impaired Eating/Feeding: Total assistance Eating/Feeding Details (indicate cue type and reason): gave pt juice but she threw it back up, feels nauseous Grooming: Bed level;Maximal assistance   Upper Body Bathing: Bed level;Maximal assistance   Lower Body Bathing: Total assistance;Bed level   Upper Body Dressing : Bed level;Maximal assistance Upper Body Dressing Details (indicate cue type and reason): wears pull overs at baseline Lower Body Dressing: Total assistance;Bed level   Toilet Transfer: Total assistance   Toileting- Clothing Manipulation and Hygiene: Total assistance         General ADL Comments: ADL assessment limited due to nystagmus  and dizziness     Vision   Vision Assessment?: Vision impaired- to be further tested in functional context (Limited visual assessment 2/2 dizziness, nystagmus causing nausea) Additional Comments: Pt reports double vision and struggles opening eyes during exam. Not able to full assess due to pt's dizziness/N/V when eyes open but noting major deficits.  Pt with resting horizontal nystagmus, gaze induced nystagmus that changes direction, some rotational components at times. Eye movements are significantly uncoordinated. L eye deviates laterally, R eye slow to track medially, Bil also drifting up/down at times - again difficult to accurately assess due to very dizzy/N/V with eyes open.     Perception         Praxis         Pertinent Vitals/Pain Pain Assessment Pain Assessment: No/denies pain     Extremity/Trunk Assessment Upper Extremity Assessment Upper Extremity Assessment: Right hand dominant;RUE deficits/detail (LUE WFL) RUE Deficits / Details: 2/5 MMT tone with elbow flex/ext, no voilitional movement from pt. R hand/fingers resting in strong flexion. Worsened R deficits per pt   Lower Extremity Assessment Lower Extremity Assessment: LLE deficits/detail;RLE deficits/detail RLE Deficits / Details: ROM: WFL; MMT: ankle 5/5, able to SLR, HIP ABD, LAQ at 3/5 (not able to further test due to dizziness/N/V) RLE Sensation: WNL LLE Deficits / Details: ROM: WFL; MMT: ankle 5/5, able to SLR, HIP ABD, LAQ at 3/5 (not able to further test due to dizziness/N/V) LLE Sensation: WNL   Cervical / Trunk Assessment Cervical / Trunk Assessment: Normal   Communication     Cognition Arousal: Alert Behavior During Therapy: Flat affect Overall Cognitive Status: No family/caregiver present to determine baseline cognitive functioning                                 General Comments: Pt following commands, oriented, seems to answer quetions appropriately but did note different reports of home support throughout the last year     General Comments  Bp 174/88(114) in supine, 160/92(111) seated EOB. dizziness persists, pt nauseous and vomitting upon return to supine, sat pt up and allower her to vomit then placed on her side to allow safe time for recovery. RN notified.    Exercises     Shoulder Instructions      Home Living  Family/patient expects to be discharged to:: Private residence Living Arrangements: Alone Available Help at Discharge:  (reports limited trying to get PCA but denied) Type of Home: Apartment Home Access: Level entry     Home Layout: One level     Bathroom Shower/Tub: Tub/shower unit;Curtain   Firefighter: Standard Bathroom Accessibility: Yes   Home Equipment: Tub bench;Grab bars - tub/shower;Rollator (4 wheels);Rolling Walker (2 wheels);Cane - single point;BSC/3in1   Additional Comments: trying to get a PCA back; did note over past year pt has reported lives alone, lives with dtr, lives with significant other -potential questionable historian      Prior Functioning/Environment Prior Level of Function : Needs assist             Mobility Comments: Walking with rollator; limited community ambulator; reports some recent falls ADLs Comments: Has transportation services;struggling with ADLs since July but reports making it on her own        OT Problem List: Impaired vision/perception;Impaired UE functional use;Impaired tone;Impaired balance (sitting and/or standing)      OT Treatment/Interventions: Self-care/ADL training;Balance training;Visual/perceptual remediation/compensation;Therapeutic exercise;Therapeutic activities;Neuromuscular education;Patient/family education    OT Goals(Current  goals can be found in the care plan section) Acute Rehab OT Goals Patient Stated Goal: To get better OT Goal Formulation: With patient Time For Goal Achievement: 04/28/23 Potential to Achieve Goals: Good  OT Frequency: Min 2X/week    Co-evaluation PT/OT/SLP Co-Evaluation/Treatment: Yes Reason for Co-Treatment: Complexity of the patient's impairments (multi-system involvement);For patient/therapist safety PT goals addressed during session: Balance;Mobility/safety with mobility OT goals addressed during session: ADL's and self-care      AM-PAC OT "6 Clicks" Daily Activity      Outcome Measure Help from another person eating meals?: Total Help from another person taking care of personal grooming?: A Lot Help from another person toileting, which includes using toliet, bedpan, or urinal?: Total Help from another person bathing (including washing, rinsing, drying)?: A Lot Help from another person to put on and taking off regular upper body clothing?: A Lot Help from another person to put on and taking off regular lower body clothing?: Total 6 Click Score: 9   End of Session Nurse Communication: Mobility status (+2, may not be able to tolerate EOB yet)  Activity Tolerance: Treatment limited secondary to medical complications (Comment) Patient left: in bed;with call bell/phone within reach  OT Visit Diagnosis: Unsteadiness on feet (R26.81);History of falling (Z91.81);Hemiplegia and hemiparesis;Other symptoms and signs involving the nervous system (R29.898);Low vision, both eyes (H54.2) Hemiplegia - Right/Left: Right Hemiplegia - dominant/non-dominant: Dominant Hemiplegia - caused by: Cerebral infarction                Time: 2130-8657 OT Time Calculation (min): 26 min Charges:  OT General Charges $OT Visit: 1 Visit OT Evaluation $OT Eval Moderate Complexity: 1 Mod  04/14/2023  AB, OTR/L  Acute Rehabilitation Services  Office: [phone]   Tristan Schroeder 04/14/2023, 11:31 AM

## 2023-04-14 NOTE — Progress Notes (Signed)
DAY ZERO NOTE    Christina Orozco  GNF:[id number] DOB: 11-28-1970 DOA: 04/13/2023 PCP: Kerin Salen, PA-C   Brief Narrative:  Christina Orozco was admitted earlier this morning, see HPI from Dr. Dimas Aguas or for complete details.    Briefly patient is a 52 year old female with recurrent CVAs, residual right-sided hemiparesis and left visual field deficits who presents with worsening vision, questionable diplopia and intractable nausea and vomiting.  Imaging in the ED confirmed acute ischemic CVA.  Neurology consulted recommending no changes given patient is on maximum medical therapy despite ongoing recurrent CVA, there is concern over noncompliance and nonadherence with medications.  At this time patient continues to have profound intractable nausea and vomiting requiring IV fluids and IV medications to maintain control of her symptoms and to offset any ongoing dehydration.  Assessment & Plan:   Principal Problem:   Acute ischemic stroke [facility]) Active Problems:   Mild intermittent asthma   Essential hypertension   CKD (chronic kidney disease) stage 4, GFR 15-29 ml/min (HCC)   DM2 (diabetes mellitus, type 2) (HCC)   History of anemia due to chronic kidney disease   Generalized weakness   Depression   Prolonged QT interval  Acute ischemic CVA  Intractable Nausea/vomiting  Diplopia  Ambulatory dysfunction/weakness, rule out orthostatic hypotension  Qtc Prolongation  DM2, NID  CKD4  HTN - permissive htn 24-48h - titrate medications starting am of the 3rd.  Depression  Asthma  Anemia of chronic disease   Examination:  General:  Pleasantly resting in bed, No acute distress. HEENT: Right eye range of motion limited - unable to gaze medially, left eye range of motion intact.  Left eye vision field essentially nil.  Right eye medial hemianopsia Neck:  Without mass or deformity.  Trachea is midline. Lungs:  Clear to auscultate bilaterally without rhonchi,  wheeze, or rales. Heart:  Regular rate and rhythm.  Without murmurs, rubs, or gallops. Abdomen:  Soft, nontender, nondistended.  Without guarding or rebound. Extremities: 4/5 right-sided strength versus 5/5 left-sided strength -sensation appears to be intact  Data Reviewed: I have personally reviewed following labs and imaging studies Radiology Studies: MR BRAIN WO CONTRAST  Result Date: 04/14/2023 CLINICAL DATA:  Initial evaluation for acute visual loss, binocular. EXAM: MRI HEAD WITHOUT CONTRAST TECHNIQUE: Multiplanar, multiecho pulse sequences of the brain and surrounding structures were obtained without intravenous contrast. COMPARISON:  Head CT from earlier the same day as well as previous MRI from 10/28/2022. FINDINGS: Brain: Mildly advanced cerebral atrophy for age. Patchy and confluent T2/FLAIR hyperintensity involving the periventricular and deep white matter both cerebral hemispheres. Patchy involvement of the thalami, with extensive involvement of the brainstem/pons. Associated wallerian degeneration involving the bilateral middle cerebellar peduncles again noted. Findings are most likely related to severe chronic microvascular ischemic disease. Multiple superimposed remote lacunar infarcts present about the bilateral basal ganglia, thalami, and pons. 7 mm acute ischemic subcortical infarct present at the posterior right frontoparietal region (series 5, image 85). Additional probable punctate acute ischemic infarct present at the right splenium, best seen on coronal DWI sequence (series 7, image 47). No associated hemorrhage or mass effect. No other evidence for acute or subacute ischemia. No acute intracranial hemorrhage. Multiple chronic micro hemorrhages noted, most pronounced about the brainstem, most characteristic of chronic poorly controlled hypertension. No mass lesion, mass effect, or midline shift. No hydrocephalus or extra-axial fluid collection. Pituitary gland and suprasellar region  within normal limits. Vascular: Major intracranial vascular flow voids are maintained. Skull and upper cervical spine:  Craniocervical junction within normal limits. Bone marrow signal intensity normal. Small lipoma noted at the right frontal scalp. Sinuses/Orbits: Prior bilateral ocular lens replacement. Mild mucosal thickening noted about the sphenoethmoidal and maxillary sinuses. Small to moderate left mastoid effusion, of doubtful significance. Other: None. IMPRESSION: 1. 7 mm acute ischemic subcortical infarct at the posterior right frontoparietal region. Additional punctate acute ischemic infarct at the right splenium. No associated hemorrhage or mass effect. 2. Underlying severe chronic microvascular ischemic disease with multiple superimposed remote lacunar infarcts about the bilateral basal ganglia, thalami, and pons. 3. Multiple chronic micro hemorrhages, most pronounced about the brainstem with wallerian degeneration. Findings are most characteristic of chronic poorly controlled hypertension. Electronically Signed   By: Rise Mu M.D.   On: 04/14/2023 03:36   CT HEAD WO CONTRAST ( )  Result Date: 04/14/2023 CLINICAL DATA:  Syncope/presyncope EXAM: CT HEAD WITHOUT CONTRAST TECHNIQUE: Contiguous axial images were obtained from the base of the skull through the vertex without intravenous contrast. RADIATION DOSE REDUCTION: This exam was performed according to the departmental dose-optimization program which includes automated exposure control, adjustment of the mA and/or kV according to patient size and/or use of iterative reconstruction technique. COMPARISON:  10/30/2022 FINDINGS: Brain: No evidence of acute infarction, hemorrhage, hydrocephalus, extra-axial collection or mass lesion/mass effect. Subcortical white matter and periventricular small vessel ischemic changes. Vascular: Intracranial atherosclerosis. Skull: Normal. Negative for fracture or focal lesion. Sinuses/Orbits: The  visualized paranasal sinuses are essentially clear. The mastoid air cells are unopacified. Other: None. IMPRESSION: No acute intracranial abnormality. Small vessel ischemic changes. Electronically Signed   By: Charline Bills M.D.   On: 04/14/2023 01:27        Scheduled Meds:  aspirin EC  81 mg Oral BID   atorvastatin  80 mg Oral QHS   insulin aspart  0-9 Units Subcutaneous TID WC   montelukast  10 mg Oral QHS   pantoprazole  40 mg Oral Daily   ticagrelor  90 mg Oral BID   Continuous Infusions:   LOS: 0 days    Time spent:    Azucena Fallen, DO [facility]  If 7PM-7AM, please contact night-coverage [email or web address]  04/14/2023, 7:00 AM

## 2023-04-14 NOTE — ED Notes (Signed)
Patient has wet her diapers, patient changed

## 2023-04-15 DIAGNOSIS — N184 Chronic kidney disease, stage 4 (severe): Secondary | ICD-10-CM

## 2023-04-15 DIAGNOSIS — Z862 Personal history of diseases of the blood and blood-forming organs and certain disorders involving the immune mechanism: Secondary | ICD-10-CM

## 2023-04-15 DIAGNOSIS — I639 Cerebral infarction, unspecified: Secondary | ICD-10-CM

## 2023-04-15 DIAGNOSIS — I10 Essential (primary) hypertension: Secondary | ICD-10-CM | POA: Diagnosis not present

## 2023-04-15 DIAGNOSIS — R531 Weakness: Secondary | ICD-10-CM

## 2023-04-15 DIAGNOSIS — R9431 Abnormal electrocardiogram [ECG] [EKG]: Secondary | ICD-10-CM

## 2023-04-15 DIAGNOSIS — N189 Chronic kidney disease, unspecified: Secondary | ICD-10-CM

## 2023-04-15 DIAGNOSIS — E1122 Type 2 diabetes mellitus with diabetic chronic kidney disease: Secondary | ICD-10-CM

## 2023-04-15 DIAGNOSIS — J4520 Mild intermittent asthma, uncomplicated: Secondary | ICD-10-CM | POA: Diagnosis not present

## 2023-04-15 DIAGNOSIS — F32A Depression, unspecified: Secondary | ICD-10-CM

## 2023-04-15 LAB — URINALYSIS, ROUTINE W REFLEX MICROSCOPIC
Bilirubin Urine: NEGATIVE
Glucose, UA: NEGATIVE mg/dL
Hgb urine dipstick: NEGATIVE
Ketones, ur: NEGATIVE mg/dL
Nitrite: NEGATIVE
Protein, ur: 100 mg/dL — AB
Specific Gravity, Urine: 1.011 (ref 1.005–1.030)
WBC, UA: >50 WBC/hpf (ref 0–5)
pH: 5 (ref 5.0–8.0)

## 2023-04-15 LAB — GLUCOSE, CAPILLARY
Glucose-Capillary: 117 mg/dL — ABNORMAL HIGH (ref 70–99)
Glucose-Capillary: 218 mg/dL — ABNORMAL HIGH (ref 70–99)
Glucose-Capillary: 71 mg/dL (ref 70–99)
Glucose-Capillary: 178 mg/dL — ABNORMAL HIGH (ref 70–99)

## 2023-04-15 MED ORDER — CARVEDILOL 12.5 MG PO TABS
12.5000 mg | ORAL_TABLET | Freq: Two times a day (BID) | ORAL | Status: DC
  Administered 2023-04-15 – 2023-04-17 (×4): 12.5 mg via ORAL
  Filled 2023-04-15 (×4): qty 1

## 2023-04-15 MED ORDER — AMLODIPINE BESYLATE 10 MG PO TABS
10.0000 mg | ORAL_TABLET | Freq: Every day | ORAL | Status: DC
  Administered 2023-04-15 – 2023-04-17 (×3): 10 mg via ORAL
  Filled 2023-04-15 (×3): qty 1

## 2023-04-15 MED ORDER — METOCLOPRAMIDE HCL 5 MG/ML IJ SOLN
5.0000 mg | Freq: Two times a day (BID) | INTRAMUSCULAR | Status: DC | PRN
Start: 2023-04-15 — End: 2023-04-17

## 2023-04-15 MED ORDER — FLUOXETINE HCL 20 MG PO CAPS
20.0000 mg | ORAL_CAPSULE | Freq: Every day | ORAL | Status: DC
Start: 2023-04-15 — End: 2023-04-17
  Administered 2023-04-15 – 2023-04-17 (×3): 20 mg via ORAL
  Filled 2023-04-15 (×3): qty 1

## 2023-04-15 NOTE — Progress Notes (Signed)
Inpatient Rehab Admissions Coordinator Note:   Per therapy patient was screened for CIR candidacy by Cletis Muma Luvenia Starch, CCC-SLP. At this time, pt appears to be a potential candidate for CIR. I will place an order for rehab consult for full assessment, per our protocol.  Please contact me any with questions.Wolfgang Phoenix, MS, CCC-SLP Admissions Coordinator [phone] 04/15/23 10:16 AM

## 2023-04-15 NOTE — Progress Notes (Signed)
PROGRESS NOTE    Christina Orozco  NWG:[id number] DOB: February 12, 1971 DOA: 04/13/2023 PCP: Kerin Salen, PA-C   Brief Narrative:  Christina Orozco is a 52 year old female with recurrent CVAs, residual right-sided hemiparesis and left visual field deficits who presents with worsening vision, questionable diplopia and intractable nausea and vomiting.  Imaging in the ED confirmed acute ischemic CVA.  Neurology consulted recommending no changes given patient is on maximum medical therapy despite ongoing recurrent CVA, there is concern over noncompliance and nonadherence with medications.   Prior intractable nausea and vomiting appears to have improved drastically with supportive care - no longer requiring IV fluids and IV medications to maintain control of her symptoms. Tolerating PO well today - looking forward to ongoing PT/OT for possible DC to rehab for ongoing management of her acute CVA.   Assessment & Plan:   Principal Problem:   Acute ischemic stroke [facility]) Active Problems:   Mild intermittent asthma   Essential hypertension   CKD (chronic kidney disease) stage 4, GFR 15-29 ml/min (HCC)   DM2 (diabetes mellitus, type 2) (HCC)   History of anemia due to chronic kidney disease   Generalized weakness   Depression   Prolonged QT interval  Acute ischemic CVA -Confirmed on imaging, neurology consulted initially, given recurrent issue with recent echo on appropriate regimen no further imaging or evaluation was deemed necessary. -Continue core measures including aspirin, Brilinta and atorvastatin -PT OT following, pending further evaluation for possible inpatient rehab versus SNF  Intractable Nausea/vomiting -Likely secondary to above acute stroke with diplopia, symptoms appear to be resolving while at rest in bed -Concern symptoms may return once PT and OT continue to advance patient's ambulatory status -Supportive care ongoing, no PRN medications necessary over the past 12  hours   Diplopia -Likely exacerbated by above ischemia/stroke -Continue supportive care as above, PT OT following  Ambulatory dysfunction/weakness, rule out orthostatic hypotension -Balance issues likely secondary to diplopia and acute stroke as above -Attempted orthostatics yesterday went poorly, patient was unable to stand long enough to get vitals -will attempt to repeat today  Qtc Prolongation -Stable at 500, continue to monitor -Decrease Reglan frequency  DM2, NID Continue to monitor, sliding scale utilized rarely  CKD4 -Without AKI, continue to monitor -Creatinine appears to be at baseline around 2.6/2.7  HTN  Permissive hypertension window closing, resume more strict control of blood pressure -Resume home amlodipine, carvedilol  Depression -Continue home Prozac  Asthma -Continue as needed nebs/montelukast  Anemia of chronic disease -Stable, follow trend  DVT prophylaxis: SCDs Start: 04/14/23 0551 Code Status:   Code Status: Full Code Family Communication: None present  Status is: Inpatient  Dispo: The patient is from: Home              Anticipated d/c is to: To be determined, potentially CIR versus SNF              Anticipated d/c date is: 24 to 48 hours              Patient currently is medically stable for discharge  Consultants:  Neuro  Procedures:  None  Antimicrobials:  None  Subjective: No acute issues or events overnight, patient denies nausea vomiting diarrhea constipation headache fevers chills or chest pain.  Diplopia ongoing but somewhat improved with rest.  Objective: Vitals:   04/14/23 2013 04/14/23 2358 04/15/23 0432 04/15/23 0500  BP: (!) 179/85 (!) 161/80 (!) 171/90   Pulse: 90 80 82   Resp:  Temp: 98.1 F (36.7 C) 98.4 F (36.9 C) 98.1 F (36.7 C)   TempSrc: Axillary Axillary Axillary   SpO2: 100% 100% 100%   Weight:    61.7 kg    Intake/Output Summary (Last 24 hours) at 04/15/2023 0730 Last data filed at 04/14/2023  2151 Gross per 24 hour  Intake 60 ml  Output --  Net 60 ml   Filed Weights   04/15/23 0500  Weight: 61.7 kg    Examination:  General:  Pleasantly resting in bed, No acute distress. HEENT: Right eye range of motion limited - unable to gaze medially, left eye range of motion intact.  Left eye vision field essentially nil.  Right eye medial hemianopsia Neck:  Without mass or deformity.  Trachea is midline. Lungs:  Clear to auscultate bilaterally without rhonchi, wheeze, or rales. Heart:  Regular rate and rhythm.  Without murmurs, rubs, or gallops. Abdomen:  Soft, nontender, nondistended.  Without guarding or rebound. Extremities: 4/5 right-sided strength versus 5/5 left-sided strength -sensation appears to be intact  Data Reviewed: I have personally reviewed following labs and imaging studies  CBC: Recent Labs  Lab 04/13/23 2230 04/14/23 1135  WBC 10.3 10.8*  NEUTROABS  --  9.0*  HGB 9.3* 9.4*  HCT 28.8* 28.8*  MCV 88.3 86.5  PLT 273 290   Basic Metabolic Panel: Recent Labs  Lab 04/13/23 2230 04/14/23 1135  NA 141 143  K 3.7 3.5  CL 111 114*  CO2 18* 19*  GLUCOSE 219* 140*  BUN 34* 32*  CREATININE 2.66* 2.64*  CALCIUM 9.4 9.7  MG  --  1.9   GFR: Estimated Creatinine Clearance: 22.3 mL/min (A) (by C-G formula based on SCr of 2.64 mg/dL (H)).  Liver Function Tests: Recent Labs  Lab 04/14/23 1135  AST 27  ALT 38  ALKPHOS 104  BILITOT 0.6  PROT 7.7  ALBUMIN 3.9   Cardiac Enzymes: Recent Labs  Lab 04/14/23 1135  CKTOTAL 112   HbA1C: Recent Labs    04/14/23 1135  HGBA1C 6.1*   CBG: Recent Labs  Lab 04/13/23 2301 04/14/23 0721 04/15/23 0623  GLUCAP 171* 146* 117*   Lipid Profile: Recent Labs    04/14/23 1135  CHOL 126  HDL 76  LDLCALC 43  TRIG 34  CHOLHDL 1.7   Thyroid Function Tests: Recent Labs    04/14/23 1135  TSH 4.421   Anemia Panel: Recent Labs    04/14/23 1135  VITAMINB12 1,021*   Sepsis Labs: No results for  input(s): "PROCALCITON", "LATICACIDVEN" in the last 168 hours.  No results found for this or any previous visit (from the past 240 hour(s)).       Radiology Studies: MR BRAIN WO CONTRAST  Result Date: 04/14/2023 CLINICAL DATA:  Initial evaluation for acute visual loss, binocular. EXAM: MRI HEAD WITHOUT CONTRAST TECHNIQUE: Multiplanar, multiecho pulse sequences of the brain and surrounding structures were obtained without intravenous contrast. COMPARISON:  Head CT from earlier the same day as well as previous MRI from 10/28/2022. FINDINGS: Brain: Mildly advanced cerebral atrophy for age. Patchy and confluent T2/FLAIR hyperintensity involving the periventricular and deep white matter both cerebral hemispheres. Patchy involvement of the thalami, with extensive involvement of the brainstem/pons. Associated wallerian degeneration involving the bilateral middle cerebellar peduncles again noted. Findings are most likely related to severe chronic microvascular ischemic disease. Multiple superimposed remote lacunar infarcts present about the bilateral basal ganglia, thalami, and pons. 7 mm acute ischemic subcortical infarct present at the posterior right frontoparietal region (  series 5, image 85). Additional probable punctate acute ischemic infarct present at the right splenium, best seen on coronal DWI sequence (series 7, image 47). No associated hemorrhage or mass effect. No other evidence for acute or subacute ischemia. No acute intracranial hemorrhage. Multiple chronic micro hemorrhages noted, most pronounced about the brainstem, most characteristic of chronic poorly controlled hypertension. No mass lesion, mass effect, or midline shift. No hydrocephalus or extra-axial fluid collection. Pituitary gland and suprasellar region within normal limits. Vascular: Major intracranial vascular flow voids are maintained. Skull and upper cervical spine: Craniocervical junction within normal limits. Bone marrow signal  intensity normal. Small lipoma noted at the right frontal scalp. Sinuses/Orbits: Prior bilateral ocular lens replacement. Mild mucosal thickening noted about the sphenoethmoidal and maxillary sinuses. Small to moderate left mastoid effusion, of doubtful significance. Other: None. IMPRESSION: 1. 7 mm acute ischemic subcortical infarct at the posterior right frontoparietal region. Additional punctate acute ischemic infarct at the right splenium. No associated hemorrhage or mass effect. 2. Underlying severe chronic microvascular ischemic disease with multiple superimposed remote lacunar infarcts about the bilateral basal ganglia, thalami, and pons. 3. Multiple chronic micro hemorrhages, most pronounced about the brainstem with wallerian degeneration. Findings are most characteristic of chronic poorly controlled hypertension. Electronically Signed   By: Rise Mu M.D.   On: 04/14/2023 03:36   CT HEAD WO CONTRAST ( )  Result Date: 04/14/2023 CLINICAL DATA:  Syncope/presyncope EXAM: CT HEAD WITHOUT CONTRAST TECHNIQUE: Contiguous axial images were obtained from the base of the skull through the vertex without intravenous contrast. RADIATION DOSE REDUCTION: This exam was performed according to the departmental dose-optimization program which includes automated exposure control, adjustment of the mA and/or kV according to patient size and/or use of iterative reconstruction technique. COMPARISON:  10/30/2022 FINDINGS: Brain: No evidence of acute infarction, hemorrhage, hydrocephalus, extra-axial collection or mass lesion/mass effect. Subcortical white matter and periventricular small vessel ischemic changes. Vascular: Intracranial atherosclerosis. Skull: Normal. Negative for fracture or focal lesion. Sinuses/Orbits: The visualized paranasal sinuses are essentially clear. The mastoid air cells are unopacified. Other: None. IMPRESSION: No acute intracranial abnormality. Small vessel ischemic changes.  Electronically Signed   By: Charline Bills M.D.   On: 04/14/2023 01:27    Scheduled Meds:  aspirin EC  81 mg Oral BID   atorvastatin  80 mg Oral QHS   insulin aspart  0-9 Units Subcutaneous TID WC   montelukast  10 mg Oral QHS   pantoprazole  40 mg Oral Daily   ticagrelor  90 mg Oral BID   Continuous Infusions:  dextrose 5 % and 0.45 % NaCl 40 mL/hr at 04/14/23 1230     LOS: 1 day   Time spent:  Azucena Fallen, DO [facility]  If 7PM-7AM, please contact night-coverage [email or web address]  04/15/2023, 7:30 AM

## 2023-04-15 NOTE — Progress Notes (Signed)
Inpatient Rehab Admissions:  Inpatient Rehab Consult received.  I met with patient at the bedside for rehabilitation assessment and to discuss goals and expectations of an inpatient rehab admission.  Discussed average length of stay, insurance authorization requirement and discharge home after completion of CIR. Pt acknowledged understanding. Pt gave permission to contact brother [name]. Spoke with [name] on the telephone. Explained CIR goals and expectations. He acknowledged  understanding. He confirmed that he can provide and arrange intermittent support/assistance for pt after discharge. Will discuss pt's dispo with physiatrist. Will continue to follow.  Signed: Wolfgang Phoenix, MS, CCC-SLP Admissions Coordinator [phone]

## 2023-04-15 NOTE — Plan of Care (Signed)
  Problem: Ischemic Stroke/TIA Tissue Perfusion: Goal: Complications of ischemic stroke/TIA will be minimized Outcome: Progressing   Problem: Coping: Goal: Will verbalize positive feelings about self Outcome: Progressing Goal: Will identify appropriate support needs Outcome: Progressing   

## 2023-04-15 NOTE — Plan of Care (Signed)
  Problem: Education: Goal: Knowledge of disease or condition will improve Outcome: Progressing   Problem: Coping: Goal: Will verbalize positive feelings about self Outcome: Progressing Goal: Will identify appropriate support needs Outcome: Progressing   

## 2023-04-15 NOTE — Plan of Care (Signed)
  Problem: Education: Goal: Knowledge of disease or condition will improve Outcome: Progressing Goal: Knowledge of secondary prevention will improve (MUST DOCUMENT ALL) Outcome: Progressing Goal: Knowledge of patient specific risk factors will improve Elta [place] N/A or DELETE if not current risk factor) Outcome: Progressing   Problem: Ischemic Stroke/TIA Tissue Perfusion: Goal: Complications of ischemic stroke/TIA will be minimized Outcome: Progressing   Problem: Coping: Goal: Will verbalize positive feelings about self Outcome: Progressing Goal: Will identify appropriate support needs Outcome: Progressing

## 2023-04-16 DIAGNOSIS — I639 Cerebral infarction, unspecified: Secondary | ICD-10-CM | POA: Diagnosis not present

## 2023-04-16 LAB — GLUCOSE, CAPILLARY
Glucose-Capillary: 122 mg/dL — ABNORMAL HIGH (ref 70–99)
Glucose-Capillary: 109 mg/dL — ABNORMAL HIGH (ref 70–99)
Glucose-Capillary: 162 mg/dL — ABNORMAL HIGH (ref 70–99)
Glucose-Capillary: 148 mg/dL — ABNORMAL HIGH (ref 70–99)

## 2023-04-16 NOTE — Progress Notes (Signed)
Occupational Therapy Treatment Patient Details Name: Christina Orozco MRN: [id number] DOB: 1971-03-11 Today's Date: 04/16/2023   History of present illness Pt is 52 yo female who presented 04/13/23 with diplopia and found to have acute  ischemic subcortical infarct of the posterior right frontal parietal region as well as a punctate acute ischemic infarct at the right splenium. Pt with hx including but not limited to DM, HTN, CKD, anemia, PVD, prior CVAs with R hemiparesis   OT comments  Pt progressing towards goals, able to ambulate short distance to sink for bathing and dressing tasks, performed CGA -min A, min A for transfers and bed mobility mainly to keep RUE on RW and directional cues 2/2 visual deficits. Pt provided with occlusion glasses with L lens taped with pt reporting improvement, educated and provided handout on occlusion glasses and plan to follow up next session to reassess. Pt presenting with impairments listed below, will follow acutely. Patient will benefit from intensive inpatient follow up therapy, >3 hours/day to maximize safety/ind with ADLs/functional mobility.       If plan is discharge home, recommend the following:  Two people to help with walking and/or transfers;A lot of help with walking and/or transfers;A lot of help with bathing/dressing/bathroom;Assistance with cooking/housework;Assist for transportation;Assistance with feeding   Equipment Recommendations  Other (comment) (defer)    Recommendations for Other Services Rehab consult    Precautions / Restrictions Precautions Precautions: Fall Precaution Comments: dizzy/N/V Restrictions Weight Bearing Restrictions: No       Mobility Bed Mobility Overal bed mobility: Needs Assistance Bed Mobility: Supine to Sit, Sit to Supine     Supine to sit: Min assist Sit to supine: Min assist        Transfers Overall transfer level: Needs assistance Equipment used: Rolling walker (2 wheels) Transfers: Sit  to/from Stand Sit to Stand: Min assist                 Balance Overall balance assessment: Needs assistance Sitting-balance support: Single extremity supported Sitting balance-Leahy Scale: Fair Sitting balance - Comments: supervision for safety due to dizziness   Standing balance support: Bilateral upper extremity supported, During functional activity, Reliant on assistive device for balance Standing balance-Leahy Scale: Poor Standing balance comment: reliant on UE support in standing                           ADL either performed or assessed with clinical judgement   ADL Overall ADL's : Needs assistance/impaired     Grooming: Wash/dry face;Set up;Sitting   Upper Body Bathing: Contact guard assist;Sitting   Lower Body Bathing: Contact guard assist;Sit to/from stand   Upper Body Dressing : Minimal assistance;Sitting Upper Body Dressing Details (indicate cue type and reason): to don gown Lower Body Dressing: Contact guard assist Lower Body Dressing Details (indicate cue type and reason): dons pull up seated in chair Toilet Transfer: Minimal assistance;Ambulation;Regular Teacher, adult education Details (indicate cue type and reason): holding R hand on RW, navigational assist/cuing         Functional mobility during ADLs: Contact guard assist;Rolling walker (2 wheels);Minimal assistance      Extremity/Trunk Assessment Upper Extremity Assessment Upper Extremity Assessment: Right hand dominant RUE Deficits / Details: can perform shoulder ROM to place hand on RW, overall decr grasp, will further assess   Lower Extremity Assessment Lower Extremity Assessment: Defer to PT evaluation        Vision   Vision Assessment?: Vision impaired- to be further  tested in functional context Additional Comments: pt reporting "seeing multiple" that resides with one eye occluded (R or L eye), pt R eye dominant and provided pt with blank glasses and taped nasal portion of L  (non dominant) eye with 1 strip of tape, pt reports improvement and no longer experienced diplopia. Pt provided with double vision handout, educated on when to wear glasses and will check back next session to reassess   Perception Perception Perception: Not tested   Praxis Praxis Praxis: Not tested    Cognition Arousal: Alert Behavior During Therapy: Flat affect Overall Cognitive Status: History of cognitive impairments - at baseline                                 General Comments: following all commands and making appropraite conversation        Exercises      Shoulder Instructions       General Comments VSS    Pertinent Vitals/ Pain       Pain Assessment Pain Assessment: Faces Pain Score: 2  Faces Pain Scale: Hurts a little bit Pain Location: gneral chronic pain Pain Descriptors / Indicators: Discomfort Pain Intervention(s): Limited activity within patient's tolerance, Monitored during session, Repositioned  Home Living   Living Arrangements: Alone Available Help at Discharge: Family;Available PRN/intermittently Type of Home: Apartment Home Access: Level entry     Home Layout: One level     Bathroom Shower/Tub: Chief Strategy Officer: Standard Bathroom Accessibility: Yes How Accessible: Accessible via wheelchair;Accessible via walker            Prior Functioning/Environment              Frequency  Min 1X/week        Progress Toward Goals  OT Goals(current goals can now be found in the care plan section)  Progress towards OT goals: Progressing toward goals  Acute Rehab OT Goals Patient Stated Goal: to get her vision back OT Goal Formulation: With patient Time For Goal Achievement: 04/28/23 Potential to Achieve Goals: Good  Plan      Co-evaluation                 AM-PAC OT "6 Clicks" Daily Activity     Outcome Measure   Help from another person eating meals?: A Little Help from another person  taking care of personal grooming?: A Little Help from another person toileting, which includes using toliet, bedpan, or urinal?: A Little Help from another person bathing (including washing, rinsing, drying)?: A Lot Help from another person to put on and taking off regular upper body clothing?: A Little Help from another person to put on and taking off regular lower body clothing?: A Little 6 Click Score: 17    End of Session Equipment Utilized During Treatment: Gait belt;Rolling walker (2 wheels)  OT Visit Diagnosis: Unsteadiness on feet (R26.81);History of falling (Z91.81);Hemiplegia and hemiparesis;Other symptoms and signs involving the nervous system (R29.898);Low vision, both eyes (H54.2) Hemiplegia - Right/Left: Right Hemiplegia - dominant/non-dominant: Dominant Hemiplegia - caused by: Cerebral infarction   Activity Tolerance Patient tolerated treatment well   Patient Left in bed;with call bell/phone within reach;with bed alarm set   Nurse Communication Mobility status        Time: 4098-1191 OT Time Calculation (min): 36 min  Charges: OT General Charges $OT Visit: 1 Visit OT Treatments $Self Care/Home Management : 8-22 mins $Therapeutic Activity: 8-22 mins  Lebron Quam, OTR/L [employer]Chat Preferred Acute Rehab [phone]   Dalphine Handing 04/16/2023, 12:34 PM

## 2023-04-16 NOTE — PMR Pre-admission (Signed)
PMR Admission Coordinator Pre-Admission Assessment  Patient: Christina Orozco is an 52 y.o., female MRN: [id number] DOB: Oct 21, 1970 Height:   Weight: 62.2 kg  Insurance Information HMO: yes    PPO:      PCP:      IPA:      80/20:      OTHER:  PRIMARY: [employer] Medicare      Policy#: [id number]      Subscriber: patient CM Name: ***      Phone#: ***     Fax#: *** Pre-Cert#: [id number]      Employer:  Benefits:  Phone #: [email or web address]     Name:  Eff. Date: 06/12/22-06/12/23     Deduct: $240 ($240 met)      Out of Pocket Max: [phone] met)      Life Max: NA CIR: $1,730 co-pay/admission      SNF: 100% coverage days 1-20, $204 co-pay/day for days 21-100 Outpatient: 80% coverage     Co-Pay: 20% Home Health: 100% coverage      Co-Pay:  DME: 80% coverage     Co-Pay: 20% Providers: in-network SECONDARY: Medicaid [place] [facility]      Policy#: [id number]     Phone#: [phone]  Financial Counselor:       Phone#:   The "Data Collection Information Summary" for patients in Inpatient Rehabilitation Facilities with attached "Privacy Act Statement-Health Care Records" was provided and verbally reviewed with: {CHL IP Patient Family VP:[id number]}  Emergency Contact Information Contact Information     Name Relation Home Work Mobile   [name],[name] Brother [phone]        Other Contacts   None on File     Current Medical History  Patient Admitting Diagnosis: CVA History of Present Illness: Pt is a 52 year old female with medical hx significant for: DM II, CKD stage IV, recurrent strokes with residual right-sided deficits, diabetic retinopathy.  Pt presented to [facility] on 04/13/23 d/t vision changes, weakness, nausea/vomiting. CT showed no acute abnormalities. MRI showed acute 7mm ischemic subcortical infarct of posterior right frontoparietal region with additional infarct at right splenium. Not a candidate for tPA or thrombectomy. Therapy evaluations completed and CIR recommended  d/t pt's deficits in functional mobility. Complete NIHSS TOTAL: 1  Patient's medical record from [facility] has been reviewed by the rehabilitation admission coordinator and physician.  Past Medical History  Past Medical History:  Diagnosis Date   Anemia    severe   Anxiety    Asthma    CKD (chronic kidney disease)    stage IV   DDD (degenerative disc disease), cervical    Depression    Diabetes mellitus (HCC)    Dislocated shoulder    right   GERD (gastroesophageal reflux disease)    Headache    migraines (about once a month)   History of kidney stones    Hypertension    Neuropathy    Peripheral vascular disease (HCC)    Pneumonia    Stroke [facility])     Has the patient had major surgery during 100 days prior to admission? Yes  Family History   family history includes Breast cancer (age of onset: 38) in her sister; CAD in her mother; Cancer in an other family member; Diabetes in an other family member; Heart disease in an other family member; Heart failure in her mother; Seizures in her maternal uncle; Vascular Disease in her mother.  Current Medications  Current Facility-Administered Medications:    acetaminophen (TYLENOL) tablet 650  mg, 650 mg, Oral, Q6H PRN **OR** acetaminophen (TYLENOL) suppository 650 mg, 650 mg, Rectal, Q6H PRN, Howerter, Justin B, DO   albuterol (PROVENTIL) (2.5 MG/3ML) 0.083% nebulizer solution 2.5 mg, 2.5 mg, Nebulization, Q4H PRN, Howerter, Justin B, DO   amLODipine (NORVASC) tablet 10 mg, 10 mg, Oral, Daily, Azucena Fallen, MD, 10 mg at 04/16/23 4034   aspirin EC tablet 81 mg, 81 mg, Oral, BID, Howerter, Justin B, DO, 81 mg at 04/16/23 0816   atorvastatin (LIPITOR) tablet 80 mg, 80 mg, Oral, QHS, Howerter, Justin B, DO, 80 mg at 04/15/23 2058   carvedilol (COREG) tablet 12.5 mg, 12.5 mg, Oral, BID WC, Azucena Fallen, MD, 12.5 mg at 04/16/23 0817   FLUoxetine (PROZAC) capsule 20 mg, 20 mg, Oral, Daily, Azucena Fallen, MD,  20 mg at 04/16/23 0817   insulin aspart (novoLOG) injection 0-9 Units, 0-9 Units, Subcutaneous, TID WC, Howerter, Justin B, DO, 1 Units at 04/16/23 7425   labetalol (NORMODYNE) injection 10 mg, 10 mg, Intravenous, Q2H PRN, Howerter, Justin B, DO   melatonin tablet 3 mg, 3 mg, Oral, QHS PRN, Howerter, Justin B, DO   metoCLOPramide (REGLAN) injection 5 mg, 5 mg, Intravenous, Q12H PRN, Azucena Fallen, MD   montelukast (SINGULAIR) tablet 10 mg, 10 mg, Oral, QHS, Howerter, Justin B, DO, 10 mg at 04/15/23 2058   pantoprazole (PROTONIX) EC tablet 40 mg, 40 mg, Oral, Daily, Howerter, Justin B, DO, 40 mg at 04/16/23 0816   ticagrelor (BRILINTA) tablet 90 mg, 90 mg, Oral, BID, Howerter, Justin B, DO, 90 mg at 04/16/23 9563  Patients Current Diet:  Diet Order             Diet regular Room service appropriate? Yes; Fluid consistency: Thin  Diet effective now                   Precautions / Restrictions Precautions Precautions: Fall Precaution Comments: dizzy/N/V Restrictions Weight Bearing Restrictions: No   Has the patient had 2 or more falls or a fall with injury in the past year? Yes  Prior Activity Level Limited Community (1-2x/wk): gets out for MD appointments and to go to the store  Prior Functional Level Self Care: Did the patient need help bathing, dressing, using the toilet or eating? Independent (has found strategies to help with UE weakness)  Indoor Mobility: Did the patient need assistance with walking from room to room (with or without device)? Independent  Stairs: Did the patient need assistance with internal or external stairs (with or without device)? Pt avoids steps  Functional Cognition: Did the patient need help planning regular tasks such as shopping or remembering to take medications? Independent  Patient Information Are you of Hispanic, Latino/a,or Spanish origin?: A. No, not of Hispanic, Latino/a, or Spanish origin What is your race?: B. Black or African  American Do you need or want an interpreter to communicate with a doctor or health care staff?: 0. No  Patient's Response To:  Health Literacy and Transportation Is the patient able to respond to health literacy and transportation needs?: Yes Health Literacy - How often do you need to have someone help you when you read instructions, pamphlets, or other written material from your doctor or pharmacy?: Never (if given large print) In the past 12 months, has lack of transportation kept you from medical appointments or from getting medications?: No In the past 12 months, has lack of transportation kept you from meetings, work, or from getting things needed for  daily living?: No  Home Assistive Devices / Equipment Home Equipment: Tub bench, Grab bars - tub/shower, Rollator (4 wheels), Rolling Walker (2 wheels), Cane - single point, BSC/3in1  Prior Device Use: Indicate devices/aids used by the patient prior to current illness, exacerbation or injury? Walker  Current Functional Level Cognition  Arousal/Alertness: Awake/alert Overall Cognitive Status: History of cognitive impairments - at baseline Orientation Level: Oriented X4 General Comments: following all commands and making appropraite conversation Attention: Focused, Sustained Focused Attention: Appears intact Sustained Attention: Appears intact Memory: Appears intact (in tact for exercises; pt reported short term recall deficits at baseline) Awareness: Appears intact Problem Solving: Appears intact Safety/Judgment: Appears intact    Extremity Assessment (includes Sensation/Coordination)  Upper Extremity Assessment: Right hand dominant RUE Deficits / Details: can perform shoulder ROM to place hand on RW, overall decr grasp, will further assess  Lower Extremity Assessment: Defer to PT evaluation RLE Deficits / Details: ROM: WFL; MMT: ankle 5/5, able to SLR, HIP ABD, LAQ at 3/5 (not able to further test due to dizziness/N/V) RLE  Sensation: WNL LLE Deficits / Details: ROM: WFL; MMT: ankle 5/5, able to SLR, HIP ABD, LAQ at 3/5 (not able to further test due to dizziness/N/V) LLE Sensation: WNL    ADLs  Overall ADL's : Needs assistance/impaired Eating/Feeding: Total assistance Eating/Feeding Details (indicate cue type and reason): gave pt juice but she threw it back up, feels nauseous Grooming: Wash/dry face, Set up, Sitting Upper Body Bathing: Contact guard assist, Sitting Lower Body Bathing: Contact guard assist, Sit to/from stand Upper Body Dressing : Minimal assistance, Sitting Upper Body Dressing Details (indicate cue type and reason): to don gown Lower Body Dressing: Contact guard assist Lower Body Dressing Details (indicate cue type and reason): dons pull up seated in chair Toilet Transfer: Minimal assistance, Ambulation, Regular Toilet Toilet Transfer Details (indicate cue type and reason): holding R hand on RW, navigational assist/cuing Toileting- Clothing Manipulation and Hygiene: Total assistance Functional mobility during ADLs: Contact guard assist, Rolling walker (2 wheels), Minimal assistance General ADL Comments: ADL assessment limited due to nystagmus and dizziness    Mobility  Overal bed mobility: Needs Assistance Bed Mobility: Supine to Sit, Sit to Supine Rolling: Mod assist Supine to sit: Min assist Sit to supine: Min assist General bed mobility comments: min A to elevate trunk into sitting, min A to return LEs to bed at end of session    Transfers  Overall transfer level: Needs assistance Equipment used: Rolling walker (2 wheels) Transfers: Sit to/from Stand Sit to Stand: Min assist General transfer comment: min A to boost to stand and steady on rise    Ambulation / Gait / Stairs / Psychologist, prison and probation services  Ambulation/Gait Ambulation/Gait assistance: Min assist, Mod assist Gait Distance (Feet): 24 Feet Assistive device: Rolling walker (2 wheels) Gait Pattern/deviations: Step-through  pattern, Decreased stride length, Ataxic General Gait Details: min A progressing to mod A to steady with increased distance and fatigue, increased ataxia and instability with distance. pt with c/o diplopia during gait Gait velocity: decr    Posture / Balance Dynamic Sitting Balance Sitting balance - Comments: supervision for safety due to dizziness Balance Overall balance assessment: Needs assistance Sitting-balance support: Single extremity supported Sitting balance-Leahy Scale: Fair Sitting balance - Comments: supervision for safety due to dizziness Standing balance support: Bilateral upper extremity supported, During functional activity, Reliant on assistive device for balance Standing balance-Leahy Scale: Poor Standing balance comment: reliant on UE support in standing    Special needs/care consideration Diabetic management  Novolog 0-9 units 3x daily with meals   Previous Home Environment (from acute therapy documentation) Living Arrangements: Alone Available Help at Discharge: Family, Available PRN/intermittently Type of Home: Apartment Home Layout: One level Home Access: Level entry Bathroom Shower/Tub: Engineer, manufacturing systems: Standard Bathroom Accessibility: Yes How Accessible: Accessible via wheelchair, Accessible via walker Home Care Services: No Additional Comments: trying to get a PCA back; did note over past year pt has reported lives alone, lives with dtr, lives with significant other -potential questionable historian  Discharge Living Setting Plans for Discharge Living Setting: Patient's home Type of Home at Discharge: Apartment Discharge Home Layout: One level Discharge Home Access: Level entry Discharge Bathroom Shower/Tub: Tub/shower unit Discharge Bathroom Toilet: Standard Discharge Bathroom Accessibility: Yes How Accessible: Accessible via wheelchair, Accessible via walker Does the patient have any problems obtaining your medications?:  No  Social/Family/Support Systems Anticipated Caregiver: Chantia Amalfitano, brother Anticipated Caregiver's Contact Information: [phone] Caregiver Availability: Intermittent Discharge Plan Discussed with Primary Caregiver: Yes Is Caregiver In Agreement with Plan?: Yes Does Caregiver/Family have Issues with Lodging/Transportation while Pt is in Rehab?: No  Goals Patient/Family Goal for Rehab: Mod I: PT/OT Expected length of stay: 14-16 days Pt/Family Agrees to Admission and willing to participate: Yes Program Orientation Provided & Reviewed with Pt/Caregiver Including Roles  & Responsibilities: Yes  Decrease burden of Care through IP rehab admission: NA  Possible need for SNF placement upon discharge: Not anticipated  Patient Condition: I have reviewed medical records from [facility], spoken with CM, and patient and family member. I met with patient at the bedside and discussed via phone for inpatient rehabilitation assessment.  Patient will benefit from ongoing PT and OT, can actively participate in 3 hours of therapy a day 5 days of the week, and can make measurable gains during the admission.  Patient will also benefit from the coordinated team approach during an Inpatient Acute Rehabilitation admission.  The patient will receive intensive therapy as well as Rehabilitation physician, nursing, social worker, and care management interventions.  Due to safety, disease management, medication administration, pain management, and patient education the patient requires 24 hour a day rehabilitation nursing.  The patient is currently *** with mobility and basic ADLs.  Discharge setting and therapy post discharge at home with home health is anticipated.  Patient has agreed to participate in the Acute Inpatient Rehabilitation Program and will admit {Time; today/tomorrow:10263}.  Preadmission Screen Completed By:  Domingo Pulse, 04/16/2023 3:13  PM ______________________________________________________________________   Discussed status with Dr. Marland Kitchen on *** at *** and received approval for admission today.  Admission Coordinator:  Domingo Pulse, CCC-SLP, time ***/Date ***   Assessment/Plan: Diagnosis: Does the need for close, 24 hr/day Medical supervision in concert with the patient's rehab needs make it unreasonable for this patient to be served in a less intensive setting? {yes_no_potentially:3041433} Co-Morbidities requiring supervision/potential complications: *** Due to {due BM:8413244}, does the patient require 24 hr/day rehab nursing? {yes_no_potentially:3041433} Does the patient require coordinated care of a physician, rehab nurse, PT, OT, and SLP to address physical and functional deficits in the context of the above medical diagnosis(es)? {yes_no_potentially:3041433} Addressing deficits in the following areas: {deficits:3041436} Can the patient actively participate in an intensive therapy program of at least 3 hrs of therapy 5 days a week? {yes_no_potentially:3041433} The potential for patient to make measurable gains while on inpatient rehab is {potential:3041437} Anticipated functional outcomes upon discharge from inpatient rehab: {functional outcomes:304600100} PT, {functional outcomes:304600100} OT, {functional outcomes:304600100} SLP Estimated  rehab length of stay to reach the above functional goals is: *** Anticipated discharge destination: {anticipated dc setting:21604} 10. Overall Rehab/Functional Prognosis: {potential:3041437}   MD Signature: ***

## 2023-04-16 NOTE — Progress Notes (Signed)
Inpatient Rehab Admissions Coordinator:  ?Insurance authorization started. Will continue to follow. ? ? ?Adela Esteban Graves Madden, MS, CCC-SLP ?Admissions Coordinator ?[phone] ? ?

## 2023-04-16 NOTE — Progress Notes (Signed)
Physical Therapy Treatment Patient Details Name: Christina Orozco MRN: [id number] DOB: 1971/05/31 Today's Date: 04/16/2023   History of Present Illness Pt is 52 yo female who presented 04/13/23 with diplopia and found to have acute  ischemic subcortical infarct of the posterior right frontal parietal region as well as a punctate acute ischemic infarct at the right splenium. Pt with hx including but not limited to DM, HTN, CKD, anemia, PVD, prior CVAs with R hemiparesis    PT Comments  Pt greeted supine in bed and agreeable to session with good progress towards acute goals. Pt continues to be limited by dizziness and diplopia with mobility, however pt able to progress transfers and gait this session. Pt requiring min A for bed mobility and transfers to stand for steadying assist, and min A progressing to mod A with increased distance for short in room ambulation with RW for support. Pt with without overt LOB during gait however pt with noted instability and ataxic steps with increased fatigue. Current plan remains appropriate to address deficits and maximize functional independence and decrease caregiver burden. Pt continues to benefit from skilled PT services to progress toward functional mobility goals.     If plan is discharge home, recommend the following: Two people to help with walking and/or transfers;Two people to help with bathing/dressing/bathroom   Can travel by private vehicle        Equipment Recommendations  Other (comment) (defer to post acute)    Recommendations for Other Services       Precautions / Restrictions Precautions Precautions: Fall Precaution Comments: dizzy/N/V Restrictions Weight Bearing Restrictions: No     Mobility  Bed Mobility Overal bed mobility: Needs Assistance Bed Mobility: Supine to Sit, Sit to Supine     Supine to sit: Min assist, HOB elevated, Used rails Sit to supine: Min assist   General bed mobility comments: min A to elevate trunk into  sitting, min A to return LEs to bed at end of session    Transfers Overall transfer level: Needs assistance Equipment used: Rolling walker (2 wheels) Transfers: Sit to/from Stand Sit to Stand: Min assist           General transfer comment: min A to boost to stand and steady on rise    Ambulation/Gait Ambulation/Gait assistance: Min assist, Mod assist Gait Distance (Feet): 24 Feet Assistive device: Rolling walker (2 wheels) Gait Pattern/deviations: Step-through pattern, Decreased stride length, Ataxic Gait velocity: decr     General Gait Details: min A progressing to mod A to steady with increased distance and fatigue, increased ataxia and instability with distance. pt with c/o diplopia during gait   Stairs             Wheelchair Mobility     Tilt Bed    Modified Rankin (Stroke Patients Only) Modified Rankin (Stroke Patients Only) Pre-Morbid Rankin Score: Moderate disability Modified Rankin: Moderately severe disability     Balance Overall balance assessment: Needs assistance Sitting-balance support: Single extremity supported Sitting balance-Leahy Scale: Fair Sitting balance - Comments: supervision for safety due to dizziness   Standing balance support: Bilateral upper extremity supported, During functional activity, Reliant on assistive device for balance Standing balance-Leahy Scale: Poor Standing balance comment: reliant on UE support in standing                            Cognition Arousal: Alert Behavior During Therapy: Flat affect Overall Cognitive Status: History of cognitive impairments - at baseline  General Comments: following all commands and making appropraite conversation        Exercises      General Comments General comments (skin integrity, edema, etc.): VSS on RA      Pertinent Vitals/Pain Pain Assessment Pain Assessment: Faces Faces Pain Scale: Hurts a little  bit Pain Location: gneral chronic pain Pain Descriptors / Indicators: Discomfort Pain Intervention(s): Monitored during session, Limited activity within patient's tolerance    Home Living       Type of Home: Apartment                  Prior Function            PT Goals (current goals can now be found in the care plan section) Acute Rehab PT Goals Patient Stated Goal: decrease dizziness PT Goal Formulation: With patient Time For Goal Achievement: 04/28/23 Progress towards PT goals: Progressing toward goals    Frequency    Min 1X/week      PT Plan      Co-evaluation              AM-PAC PT "6 Clicks" Mobility   Outcome Measure  Help needed turning from your back to your side while in a flat bed without using bedrails?: A Lot Help needed moving from lying on your back to sitting on the side of a flat bed without using bedrails?: A Lot Help needed moving to and from a bed to a chair (including a wheelchair)?: A Lot Help needed standing up from a chair using your arms (e.g., wheelchair or bedside chair)?: A Little Help needed to walk in hospital room?: A Lot Help needed climbing 3-5 steps with a railing? : Total 6 Click Score: 12    End of Session Equipment Utilized During Treatment: Gait belt Activity Tolerance: Patient tolerated treatment well Patient left: in bed;with call bell/phone within reach;with bed alarm set Nurse Communication: Mobility status PT Visit Diagnosis: Other abnormalities of gait and mobility (R26.89);Dizziness and giddiness (R42);Muscle weakness (generalized) (M62.81);Hemiplegia and hemiparesis Hemiplegia - Right/Left: Right Hemiplegia - dominant/non-dominant: Dominant Hemiplegia - caused by: Cerebral infarction     Time: 7846-9629 PT Time Calculation (min) (ACUTE ONLY): 21 min  Charges:    $Gait Training: 8-22 mins PT General Charges $$ ACUTE PT VISIT: 1 Visit                     Mirakle Tomlin R. PTA Acute Rehabilitation  Services Office: [phone]   Catalina Antigua 04/16/2023, 9:37 AM

## 2023-04-16 NOTE — TOC CM/SW Note (Signed)
Transition of Care [facility]) - Inpatient Brief Assessment   Patient Details  Name: Ashyah Quizon MRN: [id number] Date of Birth: 1970-09-27  Transition [facility] CM/SW Contact:    Kermit Balo, RN Phone Number: 04/16/2023, 2:56 PM   Clinical Narrative:  CIR has started insurance auth for a rehab admission.  Transition of Care Asessment: Insurance and Status: Insurance coverage has been reviewed Patient has primary care physician: Yes Home environment has been reviewed: home alone   Prior/Current Home Services: No current home services Social Determinants of Health Reivew: SDOH reviewed no interventions necessary Readmission risk has been reviewed: Yes Transition of care needs: transition of care needs identified, TOC will continue to follow

## 2023-04-16 NOTE — Progress Notes (Signed)
PROGRESS NOTE    Saretta Dahlem  QMV:[id number] DOB: 05-30-1971 DOA: 04/13/2023 PCP: Kerin Salen, PA-C   Brief Narrative:  Timera Windt is a 52 year old female with recurrent CVAs, residual right-sided hemiparesis and left visual field deficits who presents with worsening vision, questionable diplopia and intractable nausea and vomiting.  Imaging in the ED confirmed acute ischemic CVA.  Neurology consulted recommending no changes given patient is on maximum medical therapy despite ongoing recurrent CVA, there is concern over noncompliance and nonadherence with medications.   Prior intractable nausea and vomiting appears to have improved drastically with supportive care - no longer requiring IV fluids and IV medications to maintain control of her symptoms. Tolerating PO well today - looking forward to ongoing PT/OT for possible DC to rehab for ongoing management of her acute CVA.   Assessment & Plan:   Principal Problem:   Acute ischemic stroke [facility]) Active Problems:   Mild intermittent asthma   Essential hypertension   CKD (chronic kidney disease) stage 4, GFR 15-29 ml/min (HCC)   DM2 (diabetes mellitus, type 2) (HCC)   History of anemia due to chronic kidney disease   Generalized weakness   Depression   Prolonged QT interval  Acute ischemic CVA -Confirmed on imaging, neurology consulted initially, given recurrent issue with recent echo on appropriate regimen no further imaging or evaluation was deemed necessary. -Continue core measures including aspirin, Brilinta and atorvastatin -PT OT following, pending further evaluation for possible inpatient rehab versus SNF  Intractable Nausea/vomiting -Likely secondary to above acute stroke with diplopia, symptoms appear to be resolving while at rest in bed -Concern symptoms may return once PT and OT continue to advance patient's ambulatory status -Supportive care ongoing, no PRN medications necessary over the past 12  hours   Diplopia -Likely exacerbated by above ischemia/stroke -Continue supportive care as above, PT OT following  Ambulatory dysfunction/weakness, rule out orthostatic hypotension -Balance issues likely secondary to diplopia and acute stroke as above -Attempted orthostatics yesterday went poorly, patient was unable to stand long enough to get vitals -will attempt to repeat today  Qtc Prolongation -Stable at 500, continue to monitor -Decrease Reglan frequency  DM2, NID Continue to monitor, sliding scale utilized rarely  CKD4 -Without AKI, continue to monitor -Creatinine appears to be at baseline around 2.6/2.7  HTN  Permissive hypertension window closing, resume more strict control of blood pressure -Resume home amlodipine, carvedilol - blood pressure improving - consider ACE/ARB if remains uncontrolled over the next 48h  Depression -Continue home Prozac  Asthma -Continue as needed nebs/montelukast  Anemia of chronic disease -Stable, follow trend  DVT prophylaxis: SCDs Start: 04/14/23 0551 Code Status:   Code Status: Full Code Family Communication: None present  Status is: Inpatient  Dispo: The patient is from: Home              Anticipated d/c is to: CIR versus SNF              Anticipated d/c date is: 24 to 48 hours              Patient currently is medically stable for discharge  Consultants:  Neuro  Procedures:  None  Antimicrobials:  None  Subjective: No acute issues or events overnight, patient denies nausea vomiting diarrhea constipation headache fevers chills or chest pain.   Objective: Vitals:   04/15/23 2024 04/16/23 0007 04/16/23 0454 04/16/23 0455  BP: (!) 149/79 (!) 163/85 (!) 156/81   Pulse: 82 75 83   Resp:  18  Temp: 98.2 F (36.8 C) 98 F (36.7 C) 98.2 F (36.8 C)   TempSrc: Axillary Axillary Axillary   SpO2: 100% 100% 100%   Weight:    62.2 kg    Intake/Output Summary (Last 24 hours) at 04/16/2023 0716 Last data filed at  04/15/2023 1812 Gross per 24 hour  Intake 413 ml  Output 450 ml  Net -37 ml   Filed Weights   04/15/23 0500 04/16/23 0455  Weight: 61.7 kg 62.2 kg    Examination:  General:  Pleasantly resting in bed, No acute distress. HEENT: Right eye range of motion limited - unable to gaze medially, left eye range of motion intact.  Left eye vision field essentially nil.  Right eye medial hemianopsia Neck:  Without mass or deformity.  Trachea is midline. Lungs:  Clear to auscultate bilaterally without rhonchi, wheeze, or rales. Heart:  Regular rate and rhythm.  Without murmurs, rubs, or gallops. Abdomen:  Soft, nontender, nondistended.  Without guarding or rebound. Extremities: 4/5 right-sided strength versus 5/5 left-sided strength -sensation appears to be intact  Data Reviewed: I have personally reviewed following labs and imaging studies  CBC: Recent Labs  Lab 04/13/23 2230 04/14/23 1135  WBC 10.3 10.8*  NEUTROABS  --  9.0*  HGB 9.3* 9.4*  HCT 28.8* 28.8*  MCV 88.3 86.5  PLT 273 290   Basic Metabolic Panel: Recent Labs  Lab 04/13/23 2230 04/14/23 1135  NA 141 143  K 3.7 3.5  CL 111 114*  CO2 18* 19*  GLUCOSE 219* 140*  BUN 34* 32*  CREATININE 2.66* 2.64*  CALCIUM 9.4 9.7  MG  --  1.9   GFR: Estimated Creatinine Clearance: 22.3 mL/min (A) (by C-G formula based on SCr of 2.64 mg/dL (H)).  Liver Function Tests: Recent Labs  Lab 04/14/23 1135  AST 27  ALT 38  ALKPHOS 104  BILITOT 0.6  PROT 7.7  ALBUMIN 3.9   Cardiac Enzymes: Recent Labs  Lab 04/14/23 1135  CKTOTAL 112   HbA1C: Recent Labs    04/14/23 1135  HGBA1C 6.1*   CBG: Recent Labs  Lab 04/15/23 0623 04/15/23 1227 04/15/23 1625 04/15/23 2124 04/16/23 0625  GLUCAP 117* 218* 71 178* 122*   Lipid Profile: Recent Labs    04/14/23 1135  CHOL 126  HDL 76  LDLCALC 43  TRIG 34  CHOLHDL 1.7   Thyroid Function Tests: Recent Labs    04/14/23 1135  TSH 4.421   Anemia Panel: Recent Labs     04/14/23 1135  VITAMINB12 1,021*   Sepsis Labs: No results for input(s): "PROCALCITON", "LATICACIDVEN" in the last 168 hours.  No results found for this or any previous visit (from the past 240 hour(s)).       Radiology Studies: No results found.  Scheduled Meds:  amLODipine  10 mg Oral Daily   aspirin EC  81 mg Oral BID   atorvastatin  80 mg Oral QHS   carvedilol  12.5 mg Oral BID WC   FLUoxetine  20 mg Oral Daily   insulin aspart  0-9 Units Subcutaneous TID WC   montelukast  10 mg Oral QHS   pantoprazole  40 mg Oral Daily   ticagrelor  90 mg Oral BID   Continuous Infusions:     LOS: 2 days   Time spent:  Azucena Fallen, DO [facility]  If 7PM-7AM, please contact night-coverage [email or web address]  04/16/2023, 7:16 AM

## 2023-04-16 NOTE — Plan of Care (Signed)
  Problem: Pain Management: Goal: General experience of comfort will improve Outcome: Progressing   Problem: Safety: Goal: Ability to remain free from injury will improve Outcome: Progressing

## 2023-04-16 NOTE — Evaluation (Signed)
Clinical/Bedside Swallow Evaluation Patient Details  Name: Christina Orozco MRN: [id number] Date of Birth: 07-02-1970  Today's Date: 04/16/2023 Time: SLP Start Time (ACUTE ONLY): 0720 SLP Stop Time (ACUTE ONLY): 0801 SLP Time Calculation (min) (ACUTE ONLY): 41 min  Past Medical History:  Past Medical History:  Diagnosis Date   Anemia    severe   Anxiety    Asthma    CKD (chronic kidney disease)    stage IV   DDD (degenerative disc disease), cervical    Depression    Diabetes mellitus (HCC)    Dislocated shoulder    right   GERD (gastroesophageal reflux disease)    Headache    migraines (about once a month)   History of kidney stones    Hypertension    Neuropathy    Peripheral vascular disease (HCC)    Pneumonia    Stroke [facility])    Past Surgical History:  Past Surgical History:  Procedure Laterality Date   ADENOIDECTOMY     APPENDECTOMY     CERVICAL SPINE SURGERY  06/2012   C5-C6   EYE SURGERY     left eye surgery    FOOT SURGERY     KENALOG INJECTION Left 03/10/2021   Procedure: Intravitreal Avastin Injection;  Surgeon: Stephannie Li, MD;  Location: [facility] OR;  Service: Ophthalmology;  Laterality: Left;   LOOP RECORDER INSERTION N/A 08/27/2017   Procedure: LOOP RECORDER INSERTION;  Surgeon: Thurmon Fair, MD;  Location: [facility] INVASIVE CV LAB;  Service: Cardiovascular;  Laterality: N/A;   MEMBRANE PEEL Left 03/10/2021   Procedure: MEMBRANE PEEL;  Surgeon: Stephannie Li, MD;  Location: [facility] OR;  Service: Ophthalmology;  Laterality: Left;   PARS PLANA VITRECTOMY Left 03/10/2021   Procedure: TWENTY-FIVE GAUGE PARS PLANA VITRECTOMY;  Surgeon: Stephannie Li, MD;  Location: [facility] OR;  Service: Ophthalmology;  Laterality: Left;   PHOTOCOAGULATION WITH LASER Left 03/10/2021   Procedure: PHOTOCOAGULATION WITH LASER;  Surgeon: Stephannie Li, MD;  Location: [facility] OR;  Service: Ophthalmology;  Laterality: Left;   SHOULDER SURGERY     TEE WITHOUT CARDIOVERSION N/A 08/27/2017   Procedure:  TRANSESOPHAGEAL ECHOCARDIOGRAM (TEE);  Surgeon: Thurmon Fair, MD;  Location: [facility] ENDOSCOPY;  Service: Cardiovascular;  Laterality: N/A;   TONSILLECTOMY     HPI:  Christina Orozco is a 52 y.o. female with medical history significant for recurrent ischemic CVAs, with residual right hemiparesis, type 2 diabetes mellitus, CKD stage IV associated baseline creatinine 2.5-3.3, essential pretension, anemia of chronic kidney disease associated baseline hemoglobin 8-12, who is admitted to [facility] on 04/13/2023 with acute ischemic CVA after presenting from home to [facility] ED complaining of diplopia.    Assessment / Plan / Recommendation  Clinical Impression  Pt presents with a grossly functional oropharyngeal swallow function per clinical swallow assessment completed today. Oral prep and transit prompt with complete oral clearance. Pharyngeal swallow initiation appeared timely with laryngeal elevation noted. No overt or subtle s/s of aspiration observed.   Recommend continue regular diet and thin liquids as tolerated. No acute SLP services needed for swallowing.   SLP Visit Diagnosis:  (r/o dysphagia)    Aspiration Risk  No limitations    Diet Recommendation Thin liquid;Regular    Liquid Administration via: Cup;Straw Medication Administration: Whole meds with liquid Supervision: Patient able to self feed (meal tray set up needed) Postural Changes: Seated upright at 90 degrees    Other  Recommendations Oral Care Recommendations: Oral care BID    Recommendations for follow up therapy are one component  of a multi-disciplinary discharge planning process, led by the attending physician.  Recommendations may be updated based on patient status, additional functional criteria and insurance authorization.  Follow up Recommendations No SLP follow up      Assistance Recommended at Discharge    Functional Status Assessment Patient has not had a recent decline in their functional status (no  functional decline in speech, language, cognition, or swallowing)  Frequency and Duration  (d/c acute SLP)   (d/c acute SLP)       Prognosis        Swallow Study   General Date of Onset: 04/13/23 HPI: Christina Orozco is a 52 y.o. female with medical history significant for recurrent ischemic CVAs, with residual right hemiparesis, type 2 diabetes mellitus, CKD stage IV associated baseline creatinine 2.5-3.3, essential pretension, anemia of chronic kidney disease associated baseline hemoglobin 8-12, who is admitted to [facility] on 04/13/2023 with acute ischemic CVA after presenting from home to [facility] ED complaining of diplopia. Type of Study: Bedside Swallow Evaluation Previous Swallow Assessment: MBS 10/30/22 - regular diet and thin liquids recommended Diet Prior to this Study: Regular;Thin liquids (Level 0) Temperature Spikes Noted: No Respiratory Status: Room air History of Recent Intubation: No Behavior/Cognition: Alert;Cooperative;Pleasant mood Oral Cavity Assessment: Within Functional Limits Oral Cavity - Dentition: Adequate natural dentition Vision: Impaired for self-feeding (acute diplopia; set up of PO trials needed) Self-Feeding Abilities: Able to feed self Patient Positioning: Upright in bed Baseline Vocal Quality: Normal Volitional Swallow: Able to elicit    Oral/Motor/Sensory Function Overall Oral Motor/Sensory Function: Within functional limits   Ice Chips Ice chips: Not tested   Thin Liquid Thin Liquid: Within functional limits Presentation: Cup;Straw;Self Fed    Nectar Thick Nectar Thick Liquid: Not tested   Honey Thick Honey Thick Liquid: Not tested   Puree Puree: Within functional limits Presentation: Self Fed;Spoon   Solid     Solid: Within functional limits Presentation: Self Fed      Ellery Plunk 04/16/2023,8:13 AM

## 2023-04-16 NOTE — Evaluation (Signed)
Speech Language Pathology Evaluation Patient Details Name: Christina Orozco MRN: [id number] DOB: 06-09-1971 Today's Date: 04/16/2023 Time: 0720-0801 SLP Time Calculation (min) (ACUTE ONLY): 41 min  Problem List:  Patient Active Problem List   Diagnosis Date Noted   Acute ischemic stroke (HCC) 04/14/2023   Prolonged QT interval 04/14/2023   Diplopia 10/31/2022   OSA (obstructive sleep apnea) 08/25/2022   Subcortical infarction (HCC) 08/23/2022   Overweight (BMI 25.0-29.9) 08/23/2022   Asthma, chronic 08/22/2022   TIA (transient ischemic attack) 06/20/2022   Numbness and tingling in left hand 03/02/2022   Gait disturbance, post-stroke 03/02/2022   AKI (acute kidney injury) (HCC) 12/23/2021   Acute embolic stroke (HCC) 12/23/2021   Hypernatremia 12/20/2021   CKD (chronic kidney disease) stage 4, GFR 15-29 ml/min (HCC) 12/19/2021   Left-sided weakness and visual deficits  12/19/2021   Vision abnormalities 12/19/2021   Acute stroke due to ischemia (HCC) 11/10/2021   Depression 07/05/2020   CVA (cerebral vascular accident) (HCC) 06/21/2020   Generalized anxiety disorder 03/01/2019   Abnormality of gait 10/14/2018   Dizziness and giddiness 09/13/2018   Generalized weakness 06/26/2018   Chronic nonintractable headache 04/26/2018   Diastolic dysfunction    Congestion of nasal sinus    Pneumonia of left lower lobe due to infectious organism    SOB (shortness of breath)    Candidiasis    History of anemia due to chronic kidney disease    Anemia    Hypoalbuminemia due to protein-calorie malnutrition (HCC)    Essential hypertension    DM2 (diabetes mellitus, type 2) (HCC)    PRES (posterior reversible encephalopathy syndrome)    Hypokalemia    History of CVA with residual deficit    History of seizure 03/25/2018   Bilateral carpal tunnel syndrome 02/14/2018   Arthritis 10/31/2017   Asthma 10/31/2017   Constipation 10/31/2017   Kidney stone 10/31/2017   Anemia, chronic disease  08/09/2017   Hyperlipidemia 07/26/2017   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HCC) 07/25/2017   Anxiety as acute reaction to exceptional stress 04/23/2017   Insomnia 10/08/2016   Neuropathy 06/07/2016   Gait instability    Lesion of pons    Slurred speech    Vertigo 04/30/2016   Hyperglycemia 04/30/2016   Chronic kidney disease (CKD), stage III (moderate) (HCC) 04/30/2016   Ataxia 04/30/2016   Chronic right shoulder pain 04/03/2016   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   Rhinitis, allergic 08/25/2015   Mild intermittent asthma 07/14/2015   Gastroesophageal reflux disease without esophagitis 06/08/2015   Hypertensive cardiovascular disease 05/05/2015   Vitamin D deficiency 05/05/2015   Past Medical History:  Past Medical History:  Diagnosis Date   Anemia    severe   Anxiety    Asthma    CKD (chronic kidney disease)    stage IV   DDD (degenerative disc disease), cervical    Depression    Diabetes mellitus (HCC)    Dislocated shoulder    right   GERD (gastroesophageal reflux disease)    Headache    migraines (about once a month)   History of kidney stones    Hypertension    Neuropathy    Peripheral vascular disease (HCC)    Pneumonia    Stroke [facility])    Past Surgical History:  Past Surgical History:  Procedure Laterality Date   ADENOIDECTOMY     APPENDECTOMY     CERVICAL SPINE SURGERY  06/2012   C5-C6   EYE SURGERY     left eye surgery  FOOT SURGERY     KENALOG INJECTION Left 03/10/2021   Procedure: Intravitreal Avastin Injection;  Surgeon: Stephannie Li, MD;  Location: [facility] OR;  Service: Ophthalmology;  Laterality: Left;   LOOP RECORDER INSERTION N/A 08/27/2017   Procedure: LOOP RECORDER INSERTION;  Surgeon: Thurmon Fair, MD;  [facility]: [facility] LAB;  Service: Cardiovascular;  Laterality: N/A;   MEMBRANE PEEL Left 03/10/2021   Procedure: MEMBRANE PEEL;  Surgeon: Stephannie Li, MD;  Location: [facility] OR;  Service: Ophthalmology;  Laterality: Left;   PARS PLANA  VITRECTOMY Left 03/10/2021   Procedure: TWENTY-FIVE GAUGE PARS PLANA VITRECTOMY;  Surgeon: Stephannie Li, MD;  Location: [facility] OR;  Service: Ophthalmology;  Laterality: Left;   PHOTOCOAGULATION WITH LASER Left 03/10/2021   Procedure: PHOTOCOAGULATION WITH LASER;  Surgeon: Stephannie Li, MD;  Location: [facility] OR;  Service: Ophthalmology;  Laterality: Left;   SHOULDER SURGERY     TEE WITHOUT CARDIOVERSION N/A 08/27/2017   Procedure: TRANSESOPHAGEAL ECHOCARDIOGRAM (TEE);  Surgeon: Thurmon Fair, MD;  Location: [facility]OS[facility];  Service: Cardiovascular;  Laterality: N/A;   TONSILLECTOMY     HPI:  Christina Orozco is a 52 y.o. female with medical history significant for recurrent ischemic CVAs, with residual right hemiparesis, type 2 diabetes mellitus, CKD stage IV associated baseline creatinine 2.5-3.3, essential pretension, anemia of chronic kidney disease associated baseline hemoglobin 8-12, who is admitted to [facility] on 04/13/2023 with acute ischemic CVA after presenting from home to [facility] ED complaining of diplopia.   Assessment / Plan / Recommendation Clinical Impression  Pt seen for informal speech, language, cognitive assessment. She reported baseline memory deficits and uses compensatory strategies to manage appointments and medications (receives bubble packets). Pt reported that she feels that she is at her baseline cognitive-linguistic function.   Speech: Conversational speech 100% fluent and intelligible with no dysarthria noted. Pt recently discharged from outpatient SLP services for dysarthria from prior CVA. She does not feel that her speech has wrosened with acute CVA.   Language: Expressive and receptive language functions observed to be [facility]. Pt answered Y/N questions, completed repetition tasks, naming tasks, and followed auditory directions with >90% accuracy for all tasks. Pt benefited from repetition of some questions due to hearing impairment (tinnitus at baseline), which was  effective for improving her accuracy for language questions.   Cognition: Pt is Ox4, able to complete simple problem solving questions, and demonstrated awareness of her current deficits. She reported baseline memory difficulties, though is able to recall all of her outpatient appointments that need to be canceled for this week due to her current hospitalization. She was able to recall an unfamiliar address immediately, and after a and 5 minute delay without error.   Pt reported she feels that she is at her baseline for speech, language, and cognitive functions. She declined need for further SLP intervention at this time. Furthermore, no significant concerns for acute cognitive-linguistic impairments were noted today. SLP will sign off.     SLP Assessment  SLP Recommendation/Assessment: Patient does not need any further Speech Lanaguage Pathology Services SLP Visit Diagnosis:  (r/o dysphagia)    Recommendations for follow up therapy are one component of a multi-disciplinary discharge planning process, led by the attending physician.  Recommendations may be updated based on patient status, additional functional criteria and insurance authorization.    Follow Up Recommendations  No SLP follow up    Assistance Recommended at Discharge   (defer to PT/OT recommendations)  Functional Status Assessment Patient has not had a recent  decline in their functional status (no functional decline in speech, language, cognition, or swallowing)  Frequency and Duration  (d/c acute SLP)         SLP Evaluation Cognition  Overall Cognitive Status: History of cognitive impairments - at baseline Arousal/Alertness: Awake/alert Orientation Level: Oriented X4 Attention: Focused;Sustained Focused Attention: Appears intact Sustained Attention: Appears intact Memory: Appears intact (in tact for exercises; pt reported short term recall deficits at baseline) Awareness: Appears intact Problem Solving: Appears  intact Safety/Judgment: Appears intact       Comprehension  Auditory Comprehension Overall Auditory Comprehension: Appears within functional limits for tasks assessed Yes/No Questions: Within Functional Limits Commands: Within Functional Limits Conversation: Complex Visual Recognition/Discrimination Discrimination: Not tested Reading Comprehension Reading Status: Not tested    Expression Expression Primary Mode of Expression: Verbal Verbal Expression Overall Verbal Expression: Appears within functional limits for tasks assessed Initiation: No impairment Level of Generative/Spontaneous Verbalization: Conversation Repetition: No impairment Pragmatics: No impairment Written Expression Written Expression: Not tested   Oral / Motor  Oral Motor/Sensory Function Overall Oral Motor/Sensory Function: Within functional limits Motor Speech Overall Motor Speech: Appears within functional limits for tasks assessed Respiration: Within functional limits Phonation: Normal Resonance: Within functional limits Articulation: Within functional limitis Intelligibility: Intelligible Motor Planning: Witnin functional limits Motor Speech Errors: Not applicable            Ellery Plunk 04/16/2023, 9:21 AM

## 2023-04-17 ENCOUNTER — Encounter (HOSPITAL_COMMUNITY): Payer: Self-pay | Admitting: Physical Medicine & Rehabilitation

## 2023-04-17 ENCOUNTER — Other Ambulatory Visit: Payer: Self-pay

## 2023-04-17 ENCOUNTER — Inpatient Hospital Stay (HOSPITAL_COMMUNITY)
Admission: AD | Admit: 2023-04-17 | Discharge: 2023-04-28 | DRG: 057 | Disposition: A | Discharge status: Home Health | Payer: 59 | Source: Intra-hospital | Attending: Physical Medicine & Rehabilitation | Admitting: Physical Medicine & Rehabilitation

## 2023-04-17 DIAGNOSIS — D631 Anemia in chronic kidney disease: Secondary | ICD-10-CM | POA: Diagnosis present

## 2023-04-17 DIAGNOSIS — E1140 Type 2 diabetes mellitus with diabetic neuropathy, unspecified: Secondary | ICD-10-CM | POA: Diagnosis present

## 2023-04-17 DIAGNOSIS — D62 Acute posthemorrhagic anemia: Secondary | ICD-10-CM | POA: Diagnosis not present

## 2023-04-17 DIAGNOSIS — N179 Acute kidney failure, unspecified: Secondary | ICD-10-CM | POA: Diagnosis not present

## 2023-04-17 DIAGNOSIS — I69398 Other sequelae of cerebral infarction: Secondary | ICD-10-CM | POA: Diagnosis not present

## 2023-04-17 DIAGNOSIS — Z9989 Dependence on other enabling machines and devices: Secondary | ICD-10-CM

## 2023-04-17 DIAGNOSIS — I69322 Dysarthria following cerebral infarction: Secondary | ICD-10-CM

## 2023-04-17 DIAGNOSIS — F32A Depression, unspecified: Secondary | ICD-10-CM | POA: Diagnosis present

## 2023-04-17 DIAGNOSIS — I639 Cerebral infarction, unspecified: Principal | ICD-10-CM | POA: Diagnosis present

## 2023-04-17 DIAGNOSIS — Z7902 Long term (current) use of antithrombotics/antiplatelets: Secondary | ICD-10-CM

## 2023-04-17 DIAGNOSIS — Z87442 Personal history of urinary calculi: Secondary | ICD-10-CM

## 2023-04-17 DIAGNOSIS — J45909 Unspecified asthma, uncomplicated: Secondary | ICD-10-CM | POA: Diagnosis present

## 2023-04-17 DIAGNOSIS — E11319 Type 2 diabetes mellitus with unspecified diabetic retinopathy without macular edema: Secondary | ICD-10-CM | POA: Diagnosis present

## 2023-04-17 DIAGNOSIS — D649 Anemia, unspecified: Secondary | ICD-10-CM | POA: Diagnosis present

## 2023-04-17 DIAGNOSIS — Z79899 Other long term (current) drug therapy: Secondary | ICD-10-CM

## 2023-04-17 DIAGNOSIS — E872 Acidosis, unspecified: Secondary | ICD-10-CM | POA: Diagnosis present

## 2023-04-17 DIAGNOSIS — Z5986 Financial insecurity: Secondary | ICD-10-CM

## 2023-04-17 DIAGNOSIS — I129 Hypertensive chronic kidney disease with stage 1 through stage 4 chronic kidney disease, or unspecified chronic kidney disease: Secondary | ICD-10-CM | POA: Diagnosis present

## 2023-04-17 DIAGNOSIS — D72829 Elevated white blood cell count, unspecified: Secondary | ICD-10-CM | POA: Diagnosis present

## 2023-04-17 DIAGNOSIS — N390 Urinary tract infection, site not specified: Secondary | ICD-10-CM | POA: Diagnosis not present

## 2023-04-17 DIAGNOSIS — R278 Other lack of coordination: Secondary | ICD-10-CM | POA: Diagnosis present

## 2023-04-17 DIAGNOSIS — Z602 Problems related to living alone: Secondary | ICD-10-CM | POA: Diagnosis present

## 2023-04-17 DIAGNOSIS — Z880 Allergy status to penicillin: Secondary | ICD-10-CM

## 2023-04-17 DIAGNOSIS — E1149 Type 2 diabetes mellitus with other diabetic neurological complication: Secondary | ICD-10-CM | POA: Diagnosis not present

## 2023-04-17 DIAGNOSIS — H547 Unspecified visual loss: Secondary | ICD-10-CM | POA: Diagnosis present

## 2023-04-17 DIAGNOSIS — K5900 Constipation, unspecified: Secondary | ICD-10-CM | POA: Diagnosis present

## 2023-04-17 DIAGNOSIS — Z8249 Family history of ischemic heart disease and other diseases of the circulatory system: Secondary | ICD-10-CM

## 2023-04-17 DIAGNOSIS — M25512 Pain in left shoulder: Secondary | ICD-10-CM | POA: Diagnosis present

## 2023-04-17 DIAGNOSIS — H532 Diplopia: Secondary | ICD-10-CM | POA: Diagnosis present

## 2023-04-17 DIAGNOSIS — Z888 Allergy status to other drugs, medicaments and biological substances status: Secondary | ICD-10-CM

## 2023-04-17 DIAGNOSIS — Z7985 Long-term (current) use of injectable non-insulin antidiabetic drugs: Secondary | ICD-10-CM

## 2023-04-17 DIAGNOSIS — N184 Chronic kidney disease, stage 4 (severe): Secondary | ICD-10-CM | POA: Diagnosis present

## 2023-04-17 DIAGNOSIS — I6340 Cerebral infarction due to embolism of unspecified cerebral artery: Secondary | ICD-10-CM | POA: Diagnosis not present

## 2023-04-17 DIAGNOSIS — I63311 Cerebral infarction due to thrombosis of right middle cerebral artery: Secondary | ICD-10-CM | POA: Diagnosis not present

## 2023-04-17 DIAGNOSIS — E869 Volume depletion, unspecified: Secondary | ICD-10-CM | POA: Diagnosis not present

## 2023-04-17 DIAGNOSIS — E1122 Type 2 diabetes mellitus with diabetic chronic kidney disease: Secondary | ICD-10-CM | POA: Diagnosis present

## 2023-04-17 DIAGNOSIS — K219 Gastro-esophageal reflux disease without esophagitis: Secondary | ICD-10-CM | POA: Diagnosis present

## 2023-04-17 DIAGNOSIS — H4922 Sixth [abducent] nerve palsy, left eye: Secondary | ICD-10-CM | POA: Diagnosis present

## 2023-04-17 DIAGNOSIS — Z800 Family history of malignant neoplasm of digestive organs: Secondary | ICD-10-CM

## 2023-04-17 DIAGNOSIS — M542 Cervicalgia: Secondary | ICD-10-CM | POA: Diagnosis present

## 2023-04-17 DIAGNOSIS — F419 Anxiety disorder, unspecified: Secondary | ICD-10-CM | POA: Diagnosis present

## 2023-04-17 DIAGNOSIS — R9431 Abnormal electrocardiogram [ECG] [EKG]: Secondary | ICD-10-CM | POA: Diagnosis not present

## 2023-04-17 DIAGNOSIS — I10 Essential (primary) hypertension: Secondary | ICD-10-CM | POA: Diagnosis not present

## 2023-04-17 DIAGNOSIS — G4733 Obstructive sleep apnea (adult) (pediatric): Secondary | ICD-10-CM | POA: Diagnosis present

## 2023-04-17 DIAGNOSIS — D509 Iron deficiency anemia, unspecified: Secondary | ICD-10-CM | POA: Diagnosis present

## 2023-04-17 DIAGNOSIS — H5509 Other forms of nystagmus: Secondary | ICD-10-CM | POA: Diagnosis present

## 2023-04-17 DIAGNOSIS — I69351 Hemiplegia and hemiparesis following cerebral infarction affecting right dominant side: Secondary | ICD-10-CM | POA: Diagnosis present

## 2023-04-17 DIAGNOSIS — Z833 Family history of diabetes mellitus: Secondary | ICD-10-CM

## 2023-04-17 DIAGNOSIS — E1151 Type 2 diabetes mellitus with diabetic peripheral angiopathy without gangrene: Secondary | ICD-10-CM | POA: Diagnosis present

## 2023-04-17 DIAGNOSIS — R195 Other fecal abnormalities: Secondary | ICD-10-CM | POA: Diagnosis not present

## 2023-04-17 DIAGNOSIS — H3521 Other non-diabetic proliferative retinopathy, right eye: Secondary | ICD-10-CM | POA: Diagnosis present

## 2023-04-17 DIAGNOSIS — G8929 Other chronic pain: Secondary | ICD-10-CM | POA: Diagnosis present

## 2023-04-17 DIAGNOSIS — Z7982 Long term (current) use of aspirin: Secondary | ICD-10-CM

## 2023-04-17 DIAGNOSIS — Z803 Family history of malignant neoplasm of breast: Secondary | ICD-10-CM

## 2023-04-17 LAB — GLUCOSE, CAPILLARY
Glucose-Capillary: 99 mg/dL (ref 70–99)
Glucose-Capillary: 130 mg/dL — ABNORMAL HIGH (ref 70–99)

## 2023-04-17 MED ORDER — ORAL CARE MOUTH RINSE: 15.0000 mL | OROMUCOSAL | Status: DC | PRN

## 2023-04-17 MED ORDER — CARVEDILOL 12.5 MG PO TABS
12.5000 mg | ORAL_TABLET | Freq: Two times a day (BID) | ORAL | Status: DC
  Administered 2023-04-17 – 2023-04-28 (×22): 12.5 mg via ORAL
  Filled 2023-04-17 (×22): qty 1

## 2023-04-17 MED ORDER — ENOXAPARIN SODIUM 30 MG/0.3ML IJ SOSY
30.0000 mg | PREFILLED_SYRINGE | INTRAMUSCULAR | Status: DC
  Administered 2023-04-17 – 2023-04-27 (×11): 30 mg via SUBCUTANEOUS
  Filled 2023-04-17 (×12): qty 0.3

## 2023-04-17 MED ORDER — ENOXAPARIN SODIUM 40 MG/0.4ML IJ SOSY: 40.0000 mg | PREFILLED_SYRINGE | INTRAMUSCULAR | Status: DC

## 2023-04-17 MED ORDER — GUAIFENESIN-DM 100-10 MG/5ML PO SYRP
5.0000 mL | ORAL_SOLUTION | Freq: Four times a day (QID) | ORAL | Status: DC | PRN
Start: 2023-04-17 — End: 2023-04-28

## 2023-04-17 MED ORDER — FLEET ENEMA RE ENEM: 1.0000 | ENEMA | Freq: Once | RECTAL | Status: DC | PRN

## 2023-04-17 MED ORDER — PROCHLORPERAZINE EDISYLATE 10 MG/2ML IJ SOLN
5.0000 mg | Freq: Four times a day (QID) | INTRAMUSCULAR | Status: DC | PRN
Start: 2023-04-17 — End: 2023-04-28

## 2023-04-17 MED ORDER — MELATONIN 5 MG PO TABS: 5.0000 mg | ORAL_TABLET | Freq: Every evening | ORAL | Status: DC | PRN

## 2023-04-17 MED ORDER — NAPHAZOLINE-GLYCERIN 0.012-0.25 % OP SOLN
1.0000 [drp] | Freq: Two times a day (BID) | OPHTHALMIC | Status: DC
Start: 2023-04-17 — End: 2023-04-28
  Administered 2023-04-17 – 2023-04-18 (×3): 1 [drp] via OPHTHALMIC
  Administered 2023-04-18 – 2023-04-22 (×8): 2 [drp] via OPHTHALMIC
  Administered 2023-04-22: 1 [drp] via OPHTHALMIC
  Administered 2023-04-23 – 2023-04-26 (×7): 2 [drp] via OPHTHALMIC
  Administered 2023-04-26 – 2023-04-27 (×3): 1 [drp] via OPHTHALMIC
  Administered 2023-04-28: 2 [drp] via OPHTHALMIC
  Filled 2023-04-17: qty 15

## 2023-04-17 MED ORDER — PROCHLORPERAZINE 25 MG RE SUPP: 12.5000 mg | Freq: Four times a day (QID) | RECTAL | Status: DC | PRN

## 2023-04-17 MED ORDER — ACETAMINOPHEN 325 MG PO TABS
325.0000 mg | ORAL_TABLET | ORAL | Status: DC | PRN
  Administered 2023-04-24: 650 mg via ORAL
  Filled 2023-04-17 (×2): qty 2

## 2023-04-17 MED ORDER — POLYETHYLENE GLYCOL 3350 17 G PO PACK
24.0000 g | PACK | Freq: Once | ORAL | Status: AC
  Administered 2023-04-17: 24 g via ORAL
  Filled 2023-04-17: qty 2

## 2023-04-17 MED ORDER — DIPHENHYDRAMINE HCL 25 MG PO CAPS: 25.0000 mg | ORAL_CAPSULE | Freq: Three times a day (TID) | ORAL | Status: DC | PRN

## 2023-04-17 MED ORDER — TICAGRELOR 90 MG PO TABS
90.0000 mg | ORAL_TABLET | Freq: Two times a day (BID) | ORAL | Status: DC
Start: 2023-04-17 — End: 2023-04-28
  Administered 2023-04-17 – 2023-04-28 (×22): 90 mg via ORAL
  Filled 2023-04-17 (×22): qty 1

## 2023-04-17 MED ORDER — ALUM & MAG HYDROXIDE-SIMETH 200-200-20 MG/5ML PO SUSP: 30.0000 mL | ORAL | Status: DC | PRN

## 2023-04-17 MED ORDER — SORBITOL 70 % SOLN
60.0000 mL | Freq: Once | Status: AC
  Administered 2023-04-17: 60 mL via ORAL
  Filled 2023-04-17: qty 60

## 2023-04-17 MED ORDER — PANTOPRAZOLE SODIUM 40 MG PO TBEC
40.0000 mg | DELAYED_RELEASE_TABLET | Freq: Every day | ORAL | Status: DC
Start: 2023-04-18 — End: 2023-04-23
  Administered 2023-04-18 – 2023-04-23 (×6): 40 mg via ORAL
  Filled 2023-04-17 (×6): qty 1

## 2023-04-17 MED ORDER — BISACODYL 10 MG RE SUPP: 10.0000 mg | Freq: Every day | RECTAL | Status: DC | PRN

## 2023-04-17 MED ORDER — FLUOXETINE HCL 20 MG PO CAPS
20.0000 mg | ORAL_CAPSULE | Freq: Every day | ORAL | Status: DC
  Administered 2023-04-18 – 2023-04-28 (×11): 20 mg via ORAL
  Filled 2023-04-17 (×11): qty 1

## 2023-04-17 MED ORDER — SODIUM BICARBONATE 650 MG PO TABS
650.0000 mg | ORAL_TABLET | Freq: Two times a day (BID) | ORAL | Status: DC
  Administered 2023-04-17 – 2023-04-23 (×13): 650 mg via ORAL
  Filled 2023-04-17 (×13): qty 1

## 2023-04-17 MED ORDER — ALBUTEROL SULFATE (2.5 MG/3ML) 0.083% IN NEBU: 2.5000 mg | INHALATION_SOLUTION | RESPIRATORY_TRACT | Status: DC | PRN

## 2023-04-17 MED ORDER — ASPIRIN 81 MG PO TBEC
81.0000 mg | DELAYED_RELEASE_TABLET | Freq: Two times a day (BID) | ORAL | Status: DC
Start: 2023-04-17 — End: 2023-04-28
  Administered 2023-04-17 – 2023-04-28 (×22): 81 mg via ORAL
  Filled 2023-04-17 (×22): qty 1

## 2023-04-17 MED ORDER — MONTELUKAST SODIUM 10 MG PO TABS
10.0000 mg | ORAL_TABLET | Freq: Every day | ORAL | Status: DC
  Administered 2023-04-17 – 2023-04-27 (×11): 10 mg via ORAL
  Filled 2023-04-17 (×11): qty 1

## 2023-04-17 MED ORDER — SEMAGLUTIDE(0.25 OR 0.5MG/DOS) 2 MG/3ML ~~LOC~~ SOPN: 0.5000 mg | PEN_INJECTOR | SUBCUTANEOUS | Status: DC

## 2023-04-17 MED ORDER — ATORVASTATIN CALCIUM 80 MG PO TABS
80.0000 mg | ORAL_TABLET | Freq: Every day | ORAL | Status: DC
Start: 2023-04-17 — End: 2023-04-28
  Administered 2023-04-17 – 2023-04-27 (×11): 80 mg via ORAL
  Filled 2023-04-17 (×11): qty 1

## 2023-04-17 MED ORDER — PROCHLORPERAZINE MALEATE 5 MG PO TABS: 5.0000 mg | ORAL_TABLET | Freq: Four times a day (QID) | ORAL | Status: DC | PRN

## 2023-04-17 MED ORDER — AMLODIPINE BESYLATE 10 MG PO TABS
10.0000 mg | ORAL_TABLET | Freq: Every day | ORAL | Status: DC
  Administered 2023-04-18 – 2023-04-28 (×11): 10 mg via ORAL
  Filled 2023-04-17 (×11): qty 1

## 2023-04-17 NOTE — Progress Notes (Signed)
PMR Admission Coordinator Pre-Admission Assessment   Patient: Christina Orozco is an 52 y.o., female MRN: [id number] DOB: 1970-10-25 Height:   Weight: 62.2 kg   Insurance Information HMO: yes    PPO:      PCP:      IPA:      80/20:      OTHER:  PRIMARY: U[employer] Medicare      Policy#: [id number]      Subscriber: patient CM Name:       Phone#:      Fax#:  Pre-Cert#: [id number]  Reference # [id number] Approval received from [facility] from 04/16/23-04/23/23 with updates to be sent to fax # [phone]     Benefits:  Phone #: [email or web address]     Name:  Eff. Date: 06/12/22-06/12/23     Deduct: $240 ($240 met)      Out of Pocket Max: [phone] met)      Life Max: NA CIR: $1,730 co-pay/admission      SNF: 100% coverage days 1-20, $204 co-pay/day for days 21-100 Outpatient: 80% coverage     Co-Pay: 20% Home Health: 100% coverage      Co-Pay:  DME: 80% coverage     Co-Pay: 20% Providers: in-network SECONDARY: Medicaid [place] [facility]      Policy#: [id number]     Phone#: [phone]   Financial Counselor:       Phone#:    The "Data Collection Information Summary" for patients in Inpatient Rehabilitation Facilities with attached "Privacy Act Statement-Health Care Records" was provided and verbally reviewed with: Patient   Emergency Contact Information Contact Information       Name Relation Home Work Mobile    Defelice,[name] Brother [phone]             Other Contacts   None on File        Current Medical History  Patient Admitting Diagnosis: CVA History of Present Illness: Pt is a 52 year old female with medical hx significant for: DM II, CKD stage IV, recurrent strokes with residual right-sided deficits, diabetic retinopathy.  Pt [facility] on [facility]23 d/t vision changes, weakness, nausea/vomiting. CT showed no acute abnormalities. MRI showed acute 7mm ischemic subcortical infarct of posterior right frontoparietal region with additional infarct at right splenium. Not a  candidate for tPA or thrombectomy. Therapy evaluations completed and CIR recommended d/t pt's deficits in functional mobility. Complete NIHSS TOTAL: 1   Patient's medical record from [facility] has been reviewed by the rehabilitation admission coordinator and physician.   Past Medical History      Past Medical History:  Diagnosis Date   Anemia      severe   Anxiety     Asthma     CKD (chronic kidney disease)      stage IV   DDD (degenerative disc disease), cervical     Depression     Diabetes mellitus (HCC)     Dislocated shoulder      right   GERD (gastroesophageal reflux disease)     Headache      migraines (about once a month)   History of kidney stones     Hypertension     Neuropathy     Peripheral vascular disease (HCC)     Pneumonia     Stroke [facility])            Has the patient had major surgery during 100 days prior to admission? No   Family History   family history includes  Breast cancer (age of onset: 30) in her sister; CAD in her mother; Cancer in an other family member; Diabetes in an other family member; Heart disease in an other family member; Heart failure in her mother; Seizures in her maternal uncle; Vascular Disease in her mother.   Current Medications  Current Medications    Current Facility-Administered Medications:    acetaminophen (TYLENOL) tablet 650 mg, 650 mg, Oral, Q6H PRN **OR** acetaminophen (TYLENOL) suppository 650 mg, 650 mg, Rectal, Q6H PRN, Howerter, Justin B, DO   albuterol (PROVENTIL) (2.5 MG/3ML) 0.083% nebulizer solution 2.5 mg, 2.5 mg, Nebulization, Q4H PRN, Howerter, Justin B, DO   amLODipine (NORVASC) tablet 10 mg, 10 mg, Oral, Daily, Azucena Fallen, MD, 10 mg at 04/16/23 4098   aspirin EC tablet 81 mg, 81 mg, Oral, BID, Howerter, Justin B, DO, 81 mg at 04/16/23 0816   atorvastatin (LIPITOR) tablet 80 mg, 80 mg, Oral, QHS, Howerter, Justin B, DO, 80 mg at 04/15/23 2058   carvedilol (COREG) tablet 12.5 mg, 12.5 mg,  Oral, BID WC, Azucena Fallen, MD, 12.5 mg at 04/16/23 0817   FLUoxetine (PROZAC) capsule 20 mg, 20 mg, Oral, Daily, Azucena Fallen, MD, 20 mg at 04/16/23 0817   insulin aspart (novoLOG) injection 0-9 Units, 0-9 Units, Subcutaneous, TID WC, Howerter, Justin B, DO, 1 Units at 04/16/23 1191   labetalol (NORMODYNE) injection 10 mg, 10 mg, Intravenous, Q2H PRN, Howerter, Justin B, DO   melatonin tablet 3 mg, 3 mg, Oral, QHS PRN, Howerter, Justin B, DO   metoCLOPramide (REGLAN) injection 5 mg, 5 mg, Intravenous, Q12H PRN, Azucena Fallen, MD   montelukast (SINGULAIR) tablet 10 mg, 10 mg, Oral, QHS, Howerter, Justin B, DO, 10 mg at 04/15/23 2058   pantoprazole (PROTONIX) EC tablet 40 mg, 40 mg, Oral, Daily, Howerter, Justin B, DO, 40 mg at 04/16/23 0816   ticagrelor (BRILINTA) tablet 90 mg, 90 mg, Oral, BID, Howerter, Justin B, DO, 90 mg at 04/16/23 4782     Patients Current Diet:  Diet Order                  Diet regular Room service appropriate? Yes; Fluid consistency: Thin  Diet effective now                         Precautions / Restrictions Precautions Precautions: Fall Precaution Comments: dizzy/N/V Restrictions Weight Bearing Restrictions: No    Has the patient had 2 or more falls or a fall with injury in the past year? Yes   Prior Activity Level Limited Community (1-2x/wk): gets out for MD appointments and to go to the store   Prior Functional Level Self Care: Did the patient need help bathing, dressing, using the toilet or eating? Independent (has found strategies to help with UE weakness)   Indoor Mobility: Did the patient need assistance with walking from room to room (with or without device)? Independent   Stairs: Did the patient need assistance with internal or external stairs (with or without device)? Pt avoids steps   Functional Cognition: Did the patient need help planning regular tasks such as shopping or remembering to take medications?  Independent   Patient Information Are you of Hispanic, Latino/a,or Spanish origin?: A. No, not of Hispanic, Latino/a, or Spanish origin What is your race?: B. Black or African American Do you need or want an interpreter to communicate with a doctor or health care staff?: 0. No   Patient's Response To:  Health Literacy and Transportation Is the patient able to respond to health literacy and transportation needs?: Yes Health Literacy - How often do you need to have someone help you when you read instructions, pamphlets, or other written material from your doctor or pharmacy?: Never (if given large print) In the past 12 months, has lack of transportation kept you from medical appointments or from getting medications?: No In the past 12 months, has lack of transportation kept you from meetings, work, or from getting things needed for daily living?: No   Home Assistive Devices / Equipment Home Equipment: Tub bench, Grab bars - tub/shower, Rollator (4 wheels), Rolling Walker (2 wheels), Cane - single point, BSC/3in1   Prior Device Use: Indicate devices/aids used by the patient prior to current illness, exacerbation or injury? Walker   Current Functional Level Cognition   Arousal/Alertness: Awake/alert Overall Cognitive Status: History of cognitive impairments - at baseline Orientation Level: Oriented X4 General Comments: following all commands and making appropraite conversation Attention: Focused, Sustained Focused Attention: Appears intact Sustained Attention: Appears intact Memory: Appears intact (in tact for exercises; pt reported short term recall deficits at baseline) Awareness: Appears intact Problem Solving: Appears intact Safety/Judgment: Appears intact    Extremity Assessment (includes Sensation/Coordination)   Upper Extremity Assessment: Right hand dominant RUE Deficits / Details: can perform shoulder ROM to place hand on RW, overall decr grasp, will further assess  Lower  Extremity Assessment: Defer to PT evaluation RLE Deficits / Details: ROM: WFL; MMT: ankle 5/5, able to SLR, HIP ABD, LAQ at 3/5 (not able to further test due to dizziness/N/V) RLE Sensation: WNL LLE Deficits / Details: ROM: WFL; MMT: ankle 5/5, able to SLR, HIP ABD, LAQ at 3/5 (not able to further test due to dizziness/N/V) LLE Sensation: WNL     ADLs   Overall ADL's : Needs assistance/impaired Eating/Feeding: Total assistance Eating/Feeding Details (indicate cue type and reason): gave pt juice but she threw it back up, feels nauseous Grooming: Wash/dry face, Set up, Sitting Upper Body Bathing: Contact guard assist, Sitting Lower Body Bathing: Contact guard assist, Sit to/from stand Upper Body Dressing : Minimal assistance, Sitting Upper Body Dressing Details (indicate cue type and reason): to don gown Lower Body Dressing: Contact guard assist Lower Body Dressing Details (indicate cue type and reason): dons pull up seated in chair Toilet Transfer: Minimal assistance, Ambulation, Regular Toilet Toilet Transfer Details (indicate cue type and reason): holding R hand on RW, navigational assist/cuing Toileting- Clothing Manipulation and Hygiene: Total assistance Functional mobility during ADLs: Contact guard assist, Rolling walker (2 wheels), Minimal assistance General ADL Comments: ADL assessment limited due to nystagmus and dizziness     Mobility   Overal bed mobility: Needs Assistance Bed Mobility: Supine to Sit, Sit to Supine Rolling: Mod assist Supine to sit: Min assist Sit to supine: Min assist General bed mobility comments: min A to elevate trunk into sitting, min A to return LEs to bed at end of session     Transfers   Overall transfer level: Needs assistance Equipment used: Rolling walker (2 wheels) Transfers: Sit to/from Stand Sit to Stand: Min assist General transfer comment: min A to boost to stand and steady on rise     Ambulation / Gait / Stairs / Psychologist, prison and probation services    Ambulation/Gait Ambulation/Gait assistance: Min assist, Mod assist Gait Distance (Feet): 24 Feet Assistive device: Rolling walker (2 wheels) Gait Pattern/deviations: Step-through pattern, Decreased stride length, Ataxic General Gait Details: min A progressing to mod A to steady  with increased distance and fatigue, increased ataxia and instability with distance. pt with c/o diplopia during gait Gait velocity: decr     Posture / Balance Dynamic Sitting Balance Sitting balance - Comments: supervision for safety due to dizziness Balance Overall balance assessment: Needs assistance Sitting-balance support: Single extremity supported Sitting balance-Leahy Scale: Fair Sitting balance - Comments: supervision for safety due to dizziness Standing balance support: Bilateral upper extremity supported, During functional activity, Reliant on assistive device for balance Standing balance-Leahy Scale: Poor Standing balance comment: reliant on UE support in standing     Special needs/care consideration Diabetic management Novolog 0-9 units 3x daily with meals    Previous Home Environment (from acute therapy documentation) Living Arrangements: Alone Available Help at Discharge: Family, Available PRN/intermittently Type of Home: Apartment Home Layout: One level Home Access: Level entry Bathroom Shower/Tub: Engineer, manufacturing systems: Standard Bathroom Accessibility: Yes How Accessible: Accessible via wheelchair, Accessible via walker Home Care Services: No Additional Comments: trying to get a PCA back; did note over past year pt has reported lives alone, lives with dtr, lives with significant other -potential questionable historian   Discharge Living Setting Plans for Discharge Living Setting: Patient's home Type of Home at Discharge: Apartment Discharge Home Layout: One level Discharge Home Access: Level entry Discharge Bathroom Shower/Tub: Tub/shower unit Discharge Bathroom Toilet:  Standard Discharge Bathroom Accessibility: Yes How Accessible: Accessible via wheelchair, Accessible via walker Does the patient have any problems obtaining your medications?: No   Social/Family/Support Systems Anticipated Caregiver: Samiya Mervin, brother Anticipated Caregiver's Contact Information: [phone] Caregiver Availability: Intermittent Discharge Plan Discussed with Primary Caregiver: Yes Is Caregiver In Agreement with Plan?: Yes Does Caregiver/Family have Issues with Lodging/Transportation while Pt is in Rehab?: No   Goals Patient/Family Goal for Rehab: Mod I: PT/OT Expected length of stay: 14-16 days Pt/Family Agrees to Admission and willing to participate: Yes Program Orientation Provided & Reviewed with Pt/Caregiver Including Roles  & Responsibilities: Yes   Decrease burden of Care through IP rehab admission: NA   Possible need for SNF placement upon discharge: Not anticipated   Patient Condition: I have reviewed medical records from [facility], spoken with CM, and patient and family member. I met with patient at the bedside and discussed via phone for inpatient rehabilitation assessment.  Patient will benefit from ongoing PT and OT, can actively participate in 3 hours of therapy a day 5 days of the week, and can make measurable gains during the admission.  Patient will also benefit from the coordinated team approach during an Inpatient Acute Rehabilitation admission.  The patient will receive intensive therapy as well as Rehabilitation physician, nursing, social worker, and care management interventions.  Due to safety, disease management, medication administration, pain management, and patient education the patient requires 24 hour a day rehabilitation nursing.  The patient is currently min/mod A with mobility and basic ADLs.  Discharge setting and therapy post discharge at home with home health is anticipated.  Patient has agreed to participate in the Acute  Inpatient Rehabilitation Program and will admit today.   Preadmission Screen Completed By:  Domingo Pulse, 04/16/2023 3:13 PM, updates provided by Lissa Merlin, PT 04/17/23 ______________________________________________________________________   Discussed status with Dr. Berline Chough on 04/17/23 at 10:00 am and received approval for admission today.   Admission Coordinator:  Domingo Pulse, CCC-SLP, updates provided by Lissa Merlin, PT time 11:33 am /Date 04/17/23    Assessment/Plan: Diagnosis: R frontal/parietal infarct Does the need for close, 24 hr/day Medical  supervision in concert with the patient's rehab needs make it unreasonable for this patient to be served in a less intensive setting? Yes Co-Morbidities requiring supervision/potential complications: Prior R hemi from prior strokes; nonadhereance; CKD4; DM; HTN; PVD; Diplopia; L hemi Due to bladder management, bowel management, safety, skin/wound care, disease management, medication administration, and patient education, does the patient require 24 hr/day rehab nursing? Yes Does the patient require coordinated care of a physician, rehab nurse, PT, OT, and SLP to address physical and functional deficits in the context of the above medical diagnosis(es)? Yes Addressing deficits in the following areas: balance, endurance, locomotion, strength, transferring, bowel/bladder control, bathing, dressing, feeding, grooming, toileting, cognition, speech, language, and swallowing Can the patient actively participate in an intensive therapy program of at least 3 hrs of therapy 5 days a week? Yes The potential for patient to make measurable gains while on inpatient rehab is good Anticipated functional outcomes upon discharge from inpatient rehab: modified independent and supervision PT, modified independent and supervision OT, modified independent and supervision SLP Estimated rehab length of stay to reach the above functional goals is:  14-16 days  Anticipated discharge destination: Home 10. Overall Rehab/Functional Prognosis: good     MD Signature:

## 2023-04-17 NOTE — Progress Notes (Signed)
Patient ID: Christina Orozco, female   DOB: 19-May-1971, 52 y.o.   MRN: [id number] Met with the patient to review current situation, known from previous hospitalization for multi strokes. Reported cataract surgery in September an appears to be having strokes every other month despite taking medications and modification of diet. Reviewed secondary risks including HTN, HLD (LDL 43/Trig 34) and DM (A1C 6.1); taking Ozempic weekly PTA. Reported lives solo; goes out via [employer] for groceries and appointment. Microwave "Healthy Choice" meals due to vision.  Reviewed medications and alternatives to microwave meals. OSA with CPAP; in room. Continue to follow along to address educational needs to facilitate preparation for discharge. Pamelia Hoit

## 2023-04-17 NOTE — H&P (Signed)
Physical Medicine and Rehabilitation Admission H&P    Chief Complaint  Patient presents with   Functional deficits due to recurrent stroke    HPI: Christina Orozco is a 52 year old  R handed (cannot use R hand anymore)  female with history of HTN, T2DM with retinopathy, neuropathy, MDD, CKD, OSA, anxiety/depression, multiple strokes last 08/30/22 with  right sided weakness and dysarthria and ongoing outpatient therapy. She was admitted on 04/13/23 after syncopal episode, intractable N/V and double vision left eye. MRI brain done revealing acute 7 mm subcortical infarct in right frontoparietal region and at right splenium with severe chronic small vessel disease and multiple chronic hemorrhages s/o chronic poorly controlled HTN. Case was discussed with  neurology who recommended continuing ASA, Brilinta and Atorvastatin as on maximal therapy. GI symptoms have resolved but she continues to be limited by decrease motor control RUE and diplopia on left. Therapy has been working with patient who is limited by dizziness and  requires CGA to min assist with mobility and ADLs. She was independent PTA and CIR recommended due to functional decline.   She lives alone and brother checks in weekly. Has frozen dinners due to poor vision--unable to read regular print or see TV (just listens to it). Takes SCAT. Was told she was too young for mobile meals.   Pt reports has more distorted vision since the beginning however denies actual diplopia.   LBM Friday- afraid to push.   Says she takes her meds in bubble pack as prescribed multiple times per day.  Denies noncompliance.  Says also has L RTC tear, but doesn't want more surgery.  Normal pain "in her neck and L shoulder"- but "that's chronic".  Peeing OK Admits was smelling cigarettes smoke for 1 week prior to coming to hospital with new stroke- had called Neurologist 1 week rpior to admission to ask what to do- never heard back, not to this day. Was  concerned that meant something since wasn't NEAR cigarette smoke.   N/V is somewhat better- not intractable anymore.    Review of Systems  Constitutional:  Negative for chills and fever.  HENT:  Negative for hearing loss.   Eyes:  Positive for blurred vision and double vision. Negative for pain.  Respiratory:  Negative for cough and shortness of breath.   Cardiovascular:  Negative for chest pain and palpitations.  Gastrointestinal:  Positive for abdominal pain, constipation (passed a ball after admission but no BM since) and nausea.  Genitourinary:  Negative for dysuria.  Musculoskeletal:  Positive for falls (a week PTA) and myalgias (left shoulder/neck pain chronic).  Skin:  Negative for rash.  Neurological:  Positive for dizziness and focal weakness.  Psychiatric/Behavioral:  Negative for depression. The patient is not nervous/anxious.      Past Medical History:  Diagnosis Date   Anemia    severe   Anxiety    Asthma    CKD (chronic kidney disease)    stage IV   DDD (degenerative disc disease), cervical    Depression    Diabetes mellitus (HCC)    Dislocated shoulder    right   GERD (gastroesophageal reflux disease)    Headache    migraines (about once a month)   History of kidney stones    Hypertension    Neuropathy    Peripheral vascular disease (HCC)    Pneumonia    Stroke [facility])     Past Surgical History:  Procedure Laterality Date   ADENOIDECTOMY  APPENDECTOMY     CERVICAL SPINE SURGERY  06/2012   C5-C6   EYE SURGERY     left eye surgery    FOOT SURGERY     KENALOG INJECTION Left 03/10/2021   Procedure: Intravitreal Avastin Injection;  Surgeon: Stephannie Li, MD;  Location: [facility] OR;  Service: Ophthalmology;  Laterality: Left;   LOOP RECORDER INSERTION N/A 08/27/2017   Procedure: LOOP RECORDER INSERTION;  Surgeon: Thurmon Fair, MD;  Location: [facility] INVASIVE CV LAB;  Service: Cardiovascular;  Laterality: N/A;   MEMBRANE PEEL Left 03/10/2021   Procedure:  MEMBRANE PEEL;  Surgeon: Stephannie Li, MD;  Location: [facility] OR;  Service: Ophthalmology;  Laterality: Left;   PARS PLANA VITRECTOMY Left 03/10/2021   Procedure: TWENTY-FIVE GAUGE PARS PLANA VITRECTOMY;  Surgeon: Stephannie Li, MD;  Location: [facility] OR;  Service: Ophthalmology;  Laterality: Left;   PHOTOCOAGULATION WITH LASER Left 03/10/2021   Procedure: PHOTOCOAGULATION WITH LASER;  Surgeon: Stephannie Li, MD;  Location[facility] OR;  Service: Ophthalmology;  Laterality: Left;   SHOULDER SURGERY     TEE WITHOUT CARDIOVERSION N/A 08/27/2017   Procedure: TRANSESOPHAGEAL ECHOCARDIOGRAM (TEE);  Surgeon: Thurmon Fair, MD;  Location: [facility] ENDOSCOPY;  Service: Cardiovascular;  Laterality: N/A;   TONSILLECTOMY      Family History  Problem Relation Age of Onset   Vascular Disease Mother    CAD Mother    Heart failure Mother    Heart disease Other    Cancer Other        colon, stomach, pancreatic, lung   Diabetes Other    Breast cancer Sister 30   Seizures Maternal Uncle     Social History:  Living alone since August and used walker PTA. (daughter moved out and brother check in one a week?) She is disabled due to multiple strokes. She  reports that she has never smoked. She has never used smokeless tobacco. She reports that she does not drink alcohol and does not use drugs.   Allergies  Allergen Reactions   Lisinopril Swelling and Other (See Comments)    Facial swelling  angioedema   Penicillins Hives    Has patient had a PCN reaction causing immediate rash, facial/tongue/throat swelling, SOB or lightheadedness with hypotension: yes Has patient had a PCN reaction causing severe rash involving mucus membranes or skin necrosis: No Has patient had a PCN reaction that required hospitalization No Has patient had a PCN reaction occurring within the last 10 years: No If all of the above answers are "NO", then may proceed wit Rash on buttock that makes sitting very difficult   Quetiapine Other (See  Comments)    Unknown    Gabapentin Swelling and Other (See Comments)    "I thought I was having another stroke." dizziness    Medications Prior to Admission  Medication Sig Dispense Refill   albuterol (PROAIR HFA) 108 (90 Base) MCG/ACT inhaler INHALE 1 OR 2 PUFFS INTO THE LUNGS EVERY 6 HOURS AS NEEDED FOR WHEEZING OR SHORTNESS OF BREATH 8.5 g 0   amLODipine (NORVASC) 10 MG tablet TAKE 1 TABLET (10 MG TOTAL) BY MOUTH DAILY. (AM) 90 tablet 3   aspirin EC 81 MG tablet Take 81 mg by mouth 2 (two) times daily. Swallow whole.     atorvastatin (LIPITOR) 80 MG tablet Take 1 tablet (80 mg total) by mouth at bedtime. 90 tablet 1   carvedilol (COREG) 12.5 MG tablet Take 1 tablet (12.5 mg total) by mouth 2 (two) times daily with a meal. 180 tablet 3  FLUoxetine (PROZAC) 20 MG capsule Take 20 mg by mouth daily.     fluticasone (FLONASE) 50 MCG/ACT nasal spray Place 2 sprays into both nostrils daily.     [EXPIRED] ketorolac (ACULAR) 0.5 % ophthalmic solution Place 1 drop into the right eye in the morning and at bedtime.     montelukast (SINGULAIR) 10 MG tablet Take 10 mg by mouth at bedtime.     OZEMPIC, 0.25 OR 0.5 MG/DOSE, 2 MG/3ML SOPN Inject 0.5 mg into the skin See admin instructions. Inject 0.5 mg subcutaneous every 10 days     pantoprazole (PROTONIX) 40 MG tablet Take 1 tablet (40 mg total) by mouth daily. 30 tablet 0   sodium bicarbonate 650 MG tablet Take 650 mg by mouth 2 (two) times daily.     ticagrelor (BRILINTA) 90 MG TABS tablet Take 90 mg by mouth 2 (two) times daily.        Home: Home Living Family/patient expects to be discharged to:: Private residence Living Arrangements: Alone Available Help at Discharge: Family, Available PRN/intermittently Type of Home: Apartment Home Access: Level entry Home Layout: One level Bathroom Shower/Tub: Engineer, manufacturing systems: Standard Bathroom Accessibility: Yes Home Equipment: Tub bench, Grab bars - tub/shower, Rollator (4 wheels),  Rolling Walker (2 wheels), Cane - single point, BSC/3in1 Additional Comments: trying to get a PCA back; did note over past year pt has reported lives alone, lives with dtr, lives with significant other -potential questionable historian   Functional History: Prior Function Prior Level of Function : Needs assist Mobility Comments: Walking with rollator; limited community ambulator; reports some recent falls ADLs Comments: Has transportation services;struggling with ADLs since July but reports making it on her own  Functional Status:  Mobility: Bed Mobility Overal bed mobility: Needs Assistance Bed Mobility: Supine to Sit, Sit to Supine Rolling: Mod assist Supine to sit: Contact guard Sit to supine: Contact guard assist General bed mobility comments: min A to elevate trunk into sitting, min A to return LEs to bed at end of session Transfers Overall transfer level: Needs assistance Equipment used: Rolling walker (2 wheels) Transfers: Sit to/from Stand Sit to Stand: Contact guard assist General transfer comment: min A to boost to stand and steady on rise Ambulation/Gait Ambulation/Gait assistance: Min assist, Mod assist Gait Distance (Feet): 24 Feet Assistive device: Rolling walker (2 wheels) Gait Pattern/deviations: Step-through pattern, Decreased stride length, Ataxic General Gait Details: min A progressing to mod A to steady with increased distance and fatigue, increased ataxia and instability with distance. pt with c/o diplopia during gait Gait velocity: decr    ADL: ADL Overall ADL's : Needs assistance/impaired Eating/Feeding: Total assistance Eating/Feeding Details (indicate cue type and reason): gave pt juice but she threw it back up, feels nauseous Grooming: Wash/dry face, Set up, Sitting Upper Body Bathing: Contact guard assist, Sitting Lower Body Bathing: Contact guard assist, Sit to/from stand Upper Body Dressing : Minimal assistance, Sitting Upper Body Dressing  Details (indicate cue type and reason): to don gown Lower Body Dressing: Contact guard assist Lower Body Dressing Details (indicate cue type and reason): using LUE only (compensatory strategy) Toilet Transfer: Contact guard assist, Ambulation, Rolling walker (2 wheels) Toilet Transfer Details (indicate cue type and reason): holding R hand on RW, navigational assist/cuing Toileting- Clothing Manipulation and Hygiene: Total assistance Functional mobility during ADLs: Contact guard assist, Rolling walker (2 wheels) General ADL Comments: ADL assessment limited due to nystagmus and dizziness  Cognition: Cognition Overall Cognitive Status: History of cognitive impairments - at baseline  Arousal/Alertness: Awake/alert Orientation Level: Oriented X4 Attention: Focused, Sustained Focused Attention: Appears intact Sustained Attention: Appears intact Memory: Appears intact (in tact for exercises; pt reported short term recall deficits at baseline) Awareness: Appears intact Problem Solving: Appears intact Safety/Judgment: Appears intact Cognition Arousal: Alert Behavior During Therapy: Flat affect Overall Cognitive Status: History of cognitive impairments - at baseline General Comments: following all commands and making appropraite conversation   Blood pressure (!) 157/83, pulse 78, temperature 98.7 F (37.1 C), temperature source Oral, resp. rate 18, weight 61.8 kg, last menstrual period 05/22/2018, SpO2 100%. Physical Exam Vitals and nursing note reviewed.  Constitutional:      General: She is not in acute distress.    Appearance: Normal appearance. She is normal weight.     Comments: Pt awake, alert, appropriate, appears to  make eye contact, but then looks away abruptly due to visual disturbance, NAD- sitting up in bed  HENT:     Head: Normocephalic and atraumatic.     Comments: Smile not quite even- actual appears to be drooping slightly on R, not L.  Tongue midline Intact to Light  touch on face    Right Ear: External ear normal.     Left Ear: External ear normal.     Nose: Nose normal. No congestion.     Mouth/Throat:     Mouth: Mucous membranes are dry.     Pharynx: Oropharynx is clear. No oropharyngeal exudate.  Eyes:     Comments: Crusted drainage around left eye Severe nystagmus with L eye- EOMI with cues, but horizontal nysgmus made it difficult for her to focus on 1 object  Neck:     Comments: Tight scalenes, upper traps on L Cardiovascular:     Rate and Rhythm: Normal rate and regular rhythm.     Heart sounds: Normal heart sounds. No murmur heard.    No gallop.  Pulmonary:     Effort: Pulmonary effort is normal. No respiratory distress.     Breath sounds: Normal breath sounds. No wheezing, rhonchi or rales.  Abdominal:     General: There is distension.     Tenderness: There is no abdominal tenderness.     Comments: Hypoactive BS Somewhat distended vs protuberant NT Pretty soft  Musculoskeletal:     Cervical back: Neck supple.     Comments: RUE- biceps 4/5; Triceps 4/5; WE 5-/5; Grip 4+/5 and FA 4+/5 LUE- 5-/5 in same muscles RLE- 4+/5 in HF, KE, KF, DF and PF LLE- 5-/5 in all muscles tested  Skin:    General: Skin is warm and dry.  Neurological:     Mental Status: She is alert and oriented to person, place, and time.     Comments: Speech clear. Able to follow simple one and two step motor commands. Tends to close left eye to focus. Dysconjugate gaze on left. Decrease in fine motor movement RUE. Intact to light touch in LUE and B/L LE's- however pt said "unable to tell" if intact sensation in RUE- tested 2 different times   Psychiatric:     Comments: Slightly flat- appears to have given up somewhat     Results for orders placed or performed during the hospital encounter of 04/13/23 (from the past 48 hour(s))  Glucose, capillary     Status: Abnormal   Collection Time: 04/15/23 12:27 PM  Result Value Ref Range   Glucose-Capillary 218 (H) 70 -  99 mg/dL    Comment: Glucose reference range applies only to samples taken after  fasting for at least 8 hours.   Comment 1 Notify RN    Comment 2 Document in Chart   Glucose, capillary     Status: None   Collection Time: 04/15/23  4:25 PM  Result Value Ref Range   Glucose-Capillary 71 70 - 99 mg/dL    Comment: Glucose reference range applies only to samples taken after fasting for at least 8 hours.  Urinalysis, Routine w reflex microscopic -Urine, Clean Catch     Status: Abnormal   Collection Time: 04/15/23  9:13 PM  Result Value Ref Range   Color, Urine YELLOW YELLOW   APPearance CLOUDY (A) CLEAR   Specific Gravity, Urine 1.011 1.005 - 1.030   pH 5.0 5.0 - 8.0   Glucose, UA NEGATIVE NEGATIVE mg/dL   Hgb urine dipstick NEGATIVE NEGATIVE   Bilirubin Urine NEGATIVE NEGATIVE   Ketones, ur NEGATIVE NEGATIVE mg/dL   Protein, ur 132 (A) NEGATIVE mg/dL   Nitrite NEGATIVE NEGATIVE   Leukocytes,Ua LARGE (A) NEGATIVE   RBC / HPF 6-10 0 - 5 RBC/hpf   WBC, UA >50 0 - 5 WBC/hpf   Bacteria, UA MANY (A) NONE SEEN   Squamous Epithelial / HPF 0-5 0 - 5 /HPF   WBC Clumps PRESENT     Comment: Performed at [facility], [address] [place], [place] [address]  Glucose, capillary     Status: Abnormal   Collection Time: 04/15/23  9:24 PM  Result Value Ref Range   Glucose-Capillary 178 (H) 70 - 99 mg/dL    Comment: Glucose reference range applies only to samples taken after fasting for at least 8 hours.  Glucose, capillary     Status: Abnormal   Collection Time: 04/16/23  6:25 AM  Result Value Ref Range   Glucose-Capillary 122 (H) 70 - 99 mg/dL    Comment: Glucose reference range applies only to samples taken after fasting for at least 8 hours.  Glucose, capillary     Status: Abnormal   Collection Time: 04/16/23 12:21 PM  Result Value Ref Range   Glucose-Capillary 109 (H) 70 - 99 mg/dL    Comment: Glucose reference range applies only to samples taken after fasting for at least 8 hours.    Comment 1 Notify RN   Glucose, capillary     Status: Abnormal   Collection Time: 04/16/23  4:20 PM  Result Value Ref Range   Glucose-Capillary 162 (H) 70 - 99 mg/dL    Comment: Glucose reference range applies only to samples taken after fasting for at least 8 hours.   Comment 1 Notify RN   Glucose, capillary     Status: Abnormal   Collection Time: 04/16/23  9:10 PM  Result Value Ref Range   Glucose-Capillary 148 (H) 70 - 99 mg/dL    Comment: Glucose reference range applies only to samples taken after fasting for at least 8 hours.   No results found.    Blood pressure (!) 157/83, pulse 78, temperature 98.7 F (37.1 C), temperature source Oral, resp. rate 18, weight 61.8 kg, last menstrual period 05/22/2018, SpO2 100%.  Medical Problem List and Plan: 1. Functional deficits secondary to R frontoparietal ischemic infarct  -patient may  shower  -ELOS/Goals: mod I to supervision 14-16 days  Admit to CIR 2.  Antithrombotics: -DVT/anticoagulation:  Pharmaceutical: Lovenox  -antiplatelet therapy: ASA/Brilinta.  3. Pain Management: Tylenol for pain prn. 4. Mood/Behavior/Sleep: Melatonin prn.  -antipsychotic agents: LCSW to follow for evaluation and support.  5. Neuropsych/cognition: This  patient is capable of making decisions on her own behalf. 6. Skin/Wound Care: Routine pressure relief measures.  7. Fluids/Electrolytes/Nutrition: Monitor I/O. Check CMET in am 8. HTN: Monitor BP TID. On Coreg and amlodipine.   --Continues to be limited by dizziness--monitor for orthostatic changes.   -pt emphatic was using pill packs to do meds/bubble packs and says was compliant with meds? 9. T2DM w/retinopathy and neuropathy:  Hgb A1c- 6.1 and well controlled. -- Monitor BS ac/hs and use SSI for elevated BS.  --Was on monjuaro at home 10. 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 Limits fluids to 64 ounces otherwise on regular diet  --resume Bicarb for NAGMA.  11. OSA: Compliant with CPAP 12. H/o  Depression: On Prozac--motivated and remains positive.  13. Constipation: will add miralax. If doesn't go by this evening, will add Sorbitol- scared to push  14. Anemia of chronic disease: Monitor H/H with serial checks 15. Leucocytosis: Monitor for fevers and other signs of infection.   --recheck WBC in am.       Jacquelynn Cree, PA-C 04/17/2023  I have personally performed a face to face diagnostic evaluation of this patient and formulated the key components of the plan.  Additionally, I have personally reviewed laboratory data, imaging studies, as well as relevant notes and concur with the physician assistant's documentation above.   The patient's status has not changed from the original H&P.  Any changes in documentation from the acute care chart have been noted above.

## 2023-04-17 NOTE — Discharge Summary (Signed)
Physician Discharge Summary  Christina Orozco UEA:[id number] DOB: 1970/10/21 DOA: 04/13/2023  PCP: Kerin Salen, PA-C  Admit date: 04/13/2023 Discharge date: 04/17/2023  Admitted From: Home Disposition:  CIR  Recommendations for Outpatient Follow-up:  Follow up with PCP in 1-2 weeks  Discharge Condition:Stable  CODE STATUS:Full  Diet recommendation: Low salt low fat low carb diet    Brief/Interim Summary: Christina Orozco is a 52 year old female with recurrent CVAs, residual right-sided hemiparesis and left visual field deficits who presents with worsening vision, questionable diplopia and intractable nausea and vomiting.  Imaging in the ED confirmed acute ischemic CVA.  Neurology consulted recommending no changes given patient is on maximum medical therapy despite ongoing recurrent CVA, there is concern over noncompliance and nonadherence with medications.   Prior intractable nausea and vomiting appears to have improved drastically with supportive care - no longer requiring IV fluids and IV medications to maintain control of her symptoms. Tolerating PO well today - looking forward to ongoing PT/OT for possible DC to rehab for ongoing management of her acute CVA.   Patient remains medically stable for discharge to CIR - received authorization today.  Discharge Diagnoses:  Principal Problem:   Acute ischemic stroke [facility]) Active Problems:   Mild intermittent asthma   Essential hypertension   CKD (chronic kidney disease) stage 4, GFR 15-29 ml/min (HCC)   DM2 (diabetes mellitus, type 2) (HCC)   History of anemia due to chronic kidney disease   Generalized weakness   Depression   Prolonged QT interval    Discharge Instructions  Discharge Instructions     Discharge patient   Complete by: As directed    CIR   Discharge disposition: [facility] Not Defined   Discharge patient date: 04/17/2023      Allergies as of 04/17/2023       Reactions    Lisinopril Swelling, Other (See Comments)   Facial swelling  angioedema   Penicillins Hives   Has patient had a PCN reaction causing immediate rash, facial/tongue/throat swelling, SOB or lightheadedness with hypotension: yes Has patient had a PCN reaction causing severe rash involving mucus membranes or skin necrosis: No Has patient had a PCN reaction that required hospitalization No Has patient had a PCN reaction occurring within the last 10 years: No If all of the above answers are "NO", then may proceed wit Rash on buttock that makes sitting very difficult   Quetiapine Other (See Comments)   Unknown    Gabapentin Swelling, Other (See Comments)   "I thought I was having another stroke." dizziness        Medication List     STOP taking these medications    ketorolac 0.5 % ophthalmic solution Commonly known as: ACULAR       TAKE these medications    albuterol 108 (90 Base) MCG/ACT inhaler Commonly known as: ProAir HFA INHALE 1 OR 2 PUFFS INTO THE LUNGS EVERY 6 HOURS AS NEEDED FOR WHEEZING OR SHORTNESS OF BREATH   amLODipine 10 MG tablet Commonly known as: NORVASC TAKE 1 TABLET (10 MG TOTAL) BY MOUTH DAILY. (AM)   aspirin EC 81 MG tablet Take 81 mg by mouth 2 (two) times daily. Swallow whole.   atorvastatin 80 MG tablet Commonly known as: LIPITOR Take 1 tablet (80 mg total) by mouth at bedtime.   carvedilol 12.5 MG tablet Commonly known as: COREG Take 1 tablet (12.5 mg total) by mouth 2 (two) times daily with a meal.   FLUoxetine 20 MG capsule Commonly  known as: PROZAC Take 20 mg by mouth daily.   fluticasone 50 MCG/ACT nasal spray Commonly known as: FLONASE Place 2 sprays into both nostrils daily.   montelukast 10 MG tablet Commonly known as: SINGULAIR Take 10 mg by mouth at bedtime.   Ozempic (0.25 or 0.5 MG/DOSE) 2 MG/3ML Sopn Generic drug: Semaglutide(0.25 or 0.5MG /DOS) Inject 0.5 mg into the skin See admin instructions. Inject 0.5 mg subcutaneous  every 10 days   pantoprazole 40 MG tablet Commonly known as: PROTONIX Take 1 tablet (40 mg total) by mouth daily.   sodium bicarbonate 650 MG tablet Take 650 mg by mouth 2 (two) times daily.   ticagrelor 90 MG Tabs tablet Commonly known as: BRILINTA Take 90 mg by mouth 2 (two) times daily.        Allergies  Allergen Reactions   Lisinopril Swelling and Other (See Comments)    Facial swelling  angioedema   Penicillins Hives    Has patient had a PCN reaction causing immediate rash, facial/tongue/throat swelling, SOB or lightheadedness with hypotension: yes Has patient had a PCN reaction causing severe rash involving mucus membranes or skin necrosis: No Has patient had a PCN reaction that required hospitalization No Has patient had a PCN reaction occurring within the last 10 years: No If all of the above answers are "NO", then may proceed wit Rash on buttock that makes sitting very difficult   Quetiapine Other (See Comments)    Unknown    Gabapentin Swelling and Other (See Comments)    "I thought I was having another stroke." dizziness    Consultations: Neuro   Procedures/Studies: MR BRAIN WO CONTRAST  Result Date: 04/14/2023 CLINICAL DATA:  Initial evaluation for acute visual loss, binocular. EXAM: MRI HEAD WITHOUT CONTRAST TECHNIQUE: Multiplanar, multiecho pulse sequences of the brain and surrounding structures were obtained without intravenous contrast. COMPARISON:  Head CT from earlier the same day as well as previous MRI from 10/28/2022. FINDINGS: Brain: Mildly advanced cerebral atrophy for age. Patchy and confluent T2/FLAIR hyperintensity involving the periventricular and deep white matter both cerebral hemispheres. Patchy involvement of the thalami, with extensive involvement of the brainstem/pons. Associated wallerian degeneration involving the bilateral middle cerebellar peduncles again noted. Findings are most likely related to severe chronic microvascular ischemic  disease. Multiple superimposed remote lacunar infarcts present about the bilateral basal ganglia, thalami, and pons. 7 mm acute ischemic subcortical infarct present at the posterior right frontoparietal region (series 5, image 85). Additional probable punctate acute ischemic infarct present at the right splenium, best seen on coronal DWI sequence (series 7, image 47). No associated hemorrhage or mass effect. No other evidence for acute or subacute ischemia. No acute intracranial hemorrhage. Multiple chronic micro hemorrhages noted, most pronounced about the brainstem, most characteristic of chronic poorly controlled hypertension. No mass lesion, mass effect, or midline shift. No hydrocephalus or extra-axial fluid collection. Pituitary gland and suprasellar region within normal limits. Vascular: Major intracranial vascular flow voids are maintained. Skull and upper cervical spine: Craniocervical junction within normal limits. Bone marrow signal intensity normal. Small lipoma noted at the right frontal scalp. Sinuses/Orbits: Prior bilateral ocular lens replacement. Mild mucosal thickening noted about the sphenoethmoidal and maxillary sinuses. Small to moderate left mastoid effusion, of doubtful significance. Other: None. IMPRESSION: 1. 7 mm acute ischemic subcortical infarct at the posterior right frontoparietal region. Additional punctate acute ischemic infarct at the right splenium. No associated hemorrhage or mass effect. 2. Underlying severe chronic microvascular ischemic disease with multiple superimposed remote lacunar infarcts about  the bilateral basal ganglia, thalami, and pons. 3. Multiple chronic micro hemorrhages, most pronounced about the brainstem with wallerian degeneration. Findings are most characteristic of chronic poorly controlled hypertension. Electronically Signed   By: Rise Mu M.D.   On: 04/14/2023 03:36   CT HEAD WO CONTRAST ( )  Result Date: 04/14/2023 CLINICAL DATA:   Syncope/presyncope EXAM: CT HEAD WITHOUT CONTRAST TECHNIQUE: Contiguous axial images were obtained from the base of the skull through the vertex without intravenous contrast. RADIATION DOSE REDUCTION: This exam was performed according to the departmental dose-optimization program which includes automated exposure control, adjustment of the mA and/or kV according to patient size and/or use of iterative reconstruction technique. COMPARISON:  10/30/2022 FINDINGS: Brain: No evidence of acute infarction, hemorrhage, hydrocephalus, extra-axial collection or mass lesion/mass effect. Subcortical white matter and periventricular small vessel ischemic changes. Vascular: Intracranial atherosclerosis. Skull: Normal. Negative for fracture or focal lesion. Sinuses/Orbits: The visualized paranasal sinuses are essentially clear. The mastoid air cells are unopacified. Other: None. IMPRESSION: No acute intracranial abnormality. Small vessel ischemic changes. Electronically Signed   By: Charline Bills M.D.   On: 04/14/2023 01:27     Subjective: No acute issues/events overnight   Discharge Exam: Vitals:   04/17/23 0356 04/17/23 0812  BP: (!) 154/89 (!) 157/83  Pulse: 81 78  Resp: 18 18  Temp: 97.9 F (36.6 C) 98.7 F (37.1 C)  SpO2: 98% 100%   Vitals:   04/16/23 2350 04/17/23 0356 04/17/23 0500 04/17/23 0812  BP: 135/76 (!) 154/89  (!) 157/83  Pulse: 70 81  78  Resp: 16 18  18   Temp: 97.9 F (36.6 C) 97.9 F (36.6 C)  98.7 F (37.1 C)  TempSrc: Oral Oral  Oral  SpO2: 100% 98%  100%  Weight:   61.8 kg     General:  Pleasantly resting in bed, No acute distress. HEENT: Right eye range of motion limited - unable to gaze medially, left eye range of motion intact.  Left eye vision field essentially nil.  Right eye medial hemianopsia Neck:  Without mass or deformity.  Trachea is midline. Lungs:  Clear to auscultate bilaterally without rhonchi, wheeze, or rales. Heart:  Regular rate and rhythm.  Without  murmurs, rubs, or gallops. Abdomen:  Soft, nontender, nondistended.  Without guarding or rebound. Extremities: 4/5 right-sided strength versus 5/5 left-sided strength -sensation appears to be intact   The results of significant diagnostics from this hospitalization (including imaging, microbiology, ancillary and laboratory) are listed below for reference.     Microbiology: No results found for this or any previous visit (from the past 240 hour(s)).   Labs: BNP (last 3 results) No results for input(s): "BNP" in the last 8760 hours. Basic Metabolic Panel: Recent Labs  Lab 04/13/23 2230 04/14/23 1135  NA 141 143  K 3.7 3.5  CL 111 114*  CO2 18* 19*  GLUCOSE 219* 140*  BUN 34* 32*  CREATININE 2.66* 2.64*  CALCIUM 9.4 9.7  MG  --  1.9   Liver Function Tests: Recent Labs  Lab 04/14/23 1135  AST 27  ALT 38  ALKPHOS 104  BILITOT 0.6  PROT 7.7  ALBUMIN 3.9   No results for input(s): "LIPASE", "AMYLASE" in the last 168 hours. No results for input(s): "AMMONIA" in the last 168 hours. CBC: Recent Labs  Lab 04/13/23 2230 04/14/23 1135  WBC 10.3 10.8*  NEUTROABS  --  9.0*  HGB 9.3* 9.4*  HCT 28.8* 28.8*  MCV 88.3 86.5  PLT 273 290  Cardiac Enzymes: Recent Labs  Lab 04/14/23 1135  CKTOTAL 112   BNP: Invalid input(s): "POCBNP" CBG: Recent Labs  Lab 04/15/23 2124 04/16/23 0625 04/16/23 1221 04/16/23 1620 04/16/23 2110  GLUCAP 178* 122* 109* 162* 148*   D-Dimer No results for input(s): "DDIMER" in the last 72 hours. Hgb A1c No results for input(s): "HGBA1C" in the last 72 hours. Lipid Profile No results for input(s): "CHOL", "HDL", "LDLCALC", "TRIG", "CHOLHDL", "LDLDIRECT" in the last 72 hours. Thyroid function studies No results for input(s): "TSH", "T4TOTAL", "T3FREE", "THYROIDAB" in the last 72 hours.  Invalid input(s): "FREET3" Anemia work up No results for input(s): "VITAMINB12", "FOLATE", "FERRITIN", "TIBC", "IRON", "RETICCTPCT" in the last 72  hours. Urinalysis    Component Value Date/Time   COLORURINE YELLOW 04/15/2023 2113   APPEARANCEUR CLOUDY (A) 04/15/2023 2113   LABSPEC 1.011 04/15/2023 2113   PHURINE 5.0 04/15/2023 2113   GLUCOSEU NEGATIVE 04/15/2023 2113   HGBUR NEGATIVE 04/15/2023 2113   BILIRUBINUR NEGATIVE 04/15/2023 2113   BILIRUBINUR neg 07/15/2018 1128   KETONESUR NEGATIVE 04/15/2023 2113   PROTEINUR 100 (A) 04/15/2023 2113   UROBILINOGEN 0.2 07/15/2018 1128   UROBILINOGEN 0.2 04/23/2017 0920   NITRITE NEGATIVE 04/15/2023 2113   LEUKOCYTESUR LARGE (A) 04/15/2023 2113   Sepsis Labs Recent Labs  Lab 04/13/23 2230 04/14/23 1135  WBC 10.3 10.8*   Microbiology No results found for this or any previous visit (from the past 240 hour(s)).   Time coordinating discharge: Over 30 minutes  SIGNED:   Azucena Fallen, DO [facility] 04/17/2023, 11:07 AM Pager   If 7PM-7AM, please contact night-coverage [email or web address]

## 2023-04-17 NOTE — Plan of Care (Signed)
  Problem: Ischemic Stroke/TIA Tissue Perfusion: Goal: Complications of ischemic stroke/TIA will be minimized Outcome: Not Progressing   Problem: Self-Care: Goal: Ability to participate in self-care as condition permits will improve Outcome: Not Progressing Goal: Verbalization of feelings and concerns over difficulty with self-care will improve Outcome: Not Progressing Goal: Ability to communicate needs accurately will improve Outcome: Not Progressing   Problem: Activity: Goal: Risk for activity intolerance will decrease Outcome: Not Progressing   Problem: Safety: Goal: Ability to remain free from injury will improve Outcome: Not Progressing

## 2023-04-17 NOTE — H&P (Signed)
Physical Medicine and Rehabilitation Admission H&P        Chief Complaint  Patient presents with   Functional deficits due to recurrent stroke      HPI: Christina Orozco is a 52 year old  R handed (cannot use R hand anymore)  female with history of HTN, T2DM with retinopathy, neuropathy, MDD, CKD, OSA, anxiety/depression, multiple strokes last 08/30/22 with  right sided weakness and dysarthria and ongoing outpatient therapy. She was admitted on 04/13/23 after syncopal episode, intractable N/V and double vision left eye. MRI brain done revealing acute 7 mm subcortical infarct in right frontoparietal region and at right splenium with severe chronic small vessel disease and multiple chronic hemorrhages s/o chronic poorly controlled HTN. Case was discussed with  neurology who recommended continuing ASA, Brilinta and Atorvastatin as on maximal therapy. GI symptoms have resolved but she continues to be limited by decrease motor control RUE and diplopia on left. Therapy has been working with patient who is limited by dizziness and  requires CGA to min assist with mobility and ADLs. She was independent PTA and CIR recommended due to functional decline.    She lives alone and brother checks in weekly. Has frozen dinners due to poor vision--unable to read regular print or see TV (just listens to it). Takes SCAT. Was told she was too young for mobile meals.    Pt reports has more distorted vision since the beginning however denies actual diplopia.    LBM Friday- afraid to push.   Says she takes her meds in bubble pack as prescribed multiple times per day.  Denies noncompliance.  Says also has L RTC tear, but doesn't want more surgery.  Normal pain "in her neck and L shoulder"- but "that's chronic".  Peeing OK Admits was smelling cigarettes smoke for 1 week prior to coming to hospital with new stroke- had called Neurologist 1 week rpior to admission to ask what to do- never heard back, not to this  day. Was concerned that meant something since wasn't NEAR cigarette smoke.    N/V is somewhat better- not intractable anymore.      Review of Systems  Constitutional:  Negative for chills and fever.  HENT:  Negative for hearing loss.   Eyes:  Positive for blurred vision and double vision. Negative for pain.  Respiratory:  Negative for cough and shortness of breath.   Cardiovascular:  Negative for chest pain and palpitations.  Gastrointestinal:  Positive for abdominal pain, constipation (passed a ball after admission but no BM since) and nausea.  Genitourinary:  Negative for dysuria.  Musculoskeletal:  Positive for falls (a week PTA) and myalgias (left shoulder/neck pain chronic).  Skin:  Negative for rash.  Neurological:  Positive for dizziness and focal weakness.  Psychiatric/Behavioral:  Negative for depression. The patient is not nervous/anxious.             Past Medical History:  Diagnosis Date   Anemia      severe   Anxiety     Asthma     CKD (chronic kidney disease)      stage IV   DDD (degenerative disc disease), cervical     Depression     Diabetes mellitus (HCC)     Dislocated shoulder      right   GERD (gastroesophageal reflux disease)     Headache      migraines (about once a month)   History of kidney stones     Hypertension  Neuropathy     Peripheral vascular disease (HCC)     Pneumonia     Stroke [facility])                 Past Surgical History:  Procedure Laterality Date   ADENOIDECTOMY       APPENDECTOMY       CERVICAL SPINE SURGERY   06/2012    C5-C6   EYE SURGERY        left eye surgery    FOOT SURGERY       KENALOG INJECTION Left 03/10/2021    Procedure: Intravitreal Avastin Injection;  Surgeon: Stephannie Li, MD;  Location: [facility] OR;  Service: Ophthalmology;  Laterality: Left;   LOOP RECORDER INSERTION N/A 08/27/2017    Procedure: LOOP RECORDER INSERTION;  Surgeon: Thurmon Fair, MD;  Location: [facility] INVASIVE CV LAB;  Service: Cardiovascular;   Laterality: N/A;   MEMBRANE PEEL Left 03/10/2021    Procedure: MEMBRANE PEEL;  Surgeon: Stephannie Li, MD;  Location: [facility] OR;  Service: Ophthalmology;  Laterality: Left;   PARS PLANA VITRECTOMY Left 03/10/2021    Procedure: TWENTY-FIVE GAUGE PARS PLANA VITRECTOMY;  Surgeon: Stephannie Li, MD;  Location: [facility] OR;  Service: Ophthalmology;  Laterality: Left;   PHOTOCOAGULATION WITH LASER Left 03/10/2021    Procedure: PHOTOCOAGULATION WITH LASER;  Surgeon: Stephannie Li, MD;  Location: [facility] OR;  Service: Ophthalmology;  Laterality: Left;   SHOULDER SURGERY       TEE WITHOUT CARDIOVERSION N/A 08/27/2017    Procedure: TRANSESOPHAGEAL ECHOCARDIOGRAM (TEE);  Surgeon: Thurmon Fair, MD;  Location: [facility] ENDOSCOPY;  Service: Cardiovascular;  Laterality: N/A;   TONSILLECTOMY                   Family History  Problem Relation Age of Onset   Vascular Disease Mother     CAD Mother     Heart failure Mother     Heart disease Other     Cancer Other          colon, stomach, pancreatic, lung   Diabetes Other     Breast cancer Sister 30   Seizures Maternal Uncle            Social History:  Living alone since August and used walker PTA. (daughter moved out and brother check in one a week?) She is disabled due to multiple strokes. She  reports that she has never smoked. She has never used smokeless tobacco. She reports that she does not drink alcohol and does not use drugs.     Allergies       Allergies  Allergen Reactions   Lisinopril Swelling and Other (See Comments)      Facial swelling  angioedema   Penicillins Hives      Has patient had a PCN reaction causing immediate rash, facial/tongue/throat swelling, SOB or lightheadedness with hypotension: yes Has patient had a PCN reaction causing severe rash involving mucus membranes or skin necrosis: No Has patient had a PCN reaction that required hospitalization No Has patient had a PCN reaction occurring within the last 10 years: No If all of the  above answers are "NO", then may proceed wit Rash on buttock that makes sitting very difficult   Quetiapine Other (See Comments)      Unknown    Gabapentin Swelling and Other (See Comments)      "I thought I was having another stroke." dizziness              Medications Prior to  Admission  Medication Sig Dispense Refill   albuterol (PROAIR HFA) 108 (90 Base) MCG/ACT inhaler INHALE 1 OR 2 PUFFS INTO THE LUNGS EVERY 6 HOURS AS NEEDED FOR WHEEZING OR SHORTNESS OF BREATH 8.5 g 0   amLODipine (NORVASC) 10 MG tablet TAKE 1 TABLET (10 MG TOTAL) BY MOUTH DAILY. (AM) 90 tablet 3   aspirin EC 81 MG tablet Take 81 mg by mouth 2 (two) times daily. Swallow whole.       atorvastatin (LIPITOR) 80 MG tablet Take 1 tablet (80 mg total) by mouth at bedtime. 90 tablet 1   carvedilol (COREG) 12.5 MG tablet Take 1 tablet (12.5 mg total) by mouth 2 (two) times daily with a meal. 180 tablet 3   FLUoxetine (PROZAC) 20 MG capsule Take 20 mg by mouth daily.       fluticasone (FLONASE) 50 MCG/ACT nasal spray Place 2 sprays into both nostrils daily.       [EXPIRED] ketorolac (ACULAR) 0.5 % ophthalmic solution Place 1 drop into the right eye in the morning and at bedtime.       montelukast (SINGULAIR) 10 MG tablet Take 10 mg by mouth at bedtime.       OZEMPIC, 0.25 OR 0.5 MG/DOSE, 2 MG/3ML SOPN Inject 0.5 mg into the skin See admin instructions. Inject 0.5 mg subcutaneous every 10 days       pantoprazole (PROTONIX) 40 MG tablet Take 1 tablet (40 mg total) by mouth daily. 30 tablet 0   sodium bicarbonate 650 MG tablet Take 650 mg by mouth 2 (two) times daily.       ticagrelor (BRILINTA) 90 MG TABS tablet Take 90 mg by mouth 2 (two) times daily.                  Home: Home Living Family/patient expects to be discharged to:: Private residence Living Arrangements: Alone Available Help at Discharge: Family, Available PRN/intermittently Type of Home: Apartment Home Access: Level entry Home Layout: One  level Bathroom Shower/Tub: Engineer, manufacturing systems: Standard Bathroom Accessibility: Yes Home Equipment: Tub bench, Grab bars - tub/shower, Rollator (4 wheels), Rolling Walker (2 wheels), Cane - single point, BSC/3in1 Additional Comments: trying to get a PCA back; did note over past year pt has reported lives alone, lives with dtr, lives with significant other -potential questionable historian   Functional History: Prior Function Prior Level of Function : Needs assist Mobility Comments: Walking with rollator; limited community ambulator; reports some recent falls ADLs Comments: Has transportation services;struggling with ADLs since July but reports making it on her own   Functional Status:  Mobility: Bed Mobility Overal bed mobility: Needs Assistance Bed Mobility: Supine to Sit, Sit to Supine Rolling: Mod assist Supine to sit: Contact guard Sit to supine: Contact guard assist General bed mobility comments: min A to elevate trunk into sitting, min A to return LEs to bed at end of session Transfers Overall transfer level: Needs assistance Equipment used: Rolling walker (2 wheels) Transfers: Sit to/from Stand Sit to Stand: Contact guard assist General transfer comment: min A to boost to stand and steady on rise Ambulation/Gait Ambulation/Gait assistance: Min assist, Mod assist Gait Distance (Feet): 24 Feet Assistive device: Rolling walker (2 wheels) Gait Pattern/deviations: Step-through pattern, Decreased stride length, Ataxic General Gait Details: min A progressing to mod A to steady with increased distance and fatigue, increased ataxia and instability with distance. pt with c/o diplopia during gait Gait velocity: decr   ADL: ADL Overall ADL's : Needs assistance/impaired Eating/Feeding:  Total assistance Eating/Feeding Details (indicate cue type and reason): gave pt juice but she threw it back up, feels nauseous Grooming: Wash/dry face, Set up, Sitting Upper Body  Bathing: Contact guard assist, Sitting Lower Body Bathing: Contact guard assist, Sit to/from stand Upper Body Dressing : Minimal assistance, Sitting Upper Body Dressing Details (indicate cue type and reason): to don gown Lower Body Dressing: Contact guard assist Lower Body Dressing Details (indicate cue type and reason): using LUE only (compensatory strategy) Toilet Transfer: Contact guard assist, Ambulation, Rolling walker (2 wheels) Toilet Transfer Details (indicate cue type and reason): holding R hand on RW, navigational assist/cuing Toileting- Clothing Manipulation and Hygiene: Total assistance Functional mobility during ADLs: Contact guard assist, Rolling walker (2 wheels) General ADL Comments: ADL assessment limited due to nystagmus and dizziness   Cognition: Cognition Overall Cognitive Status: History of cognitive impairments - at baseline Arousal/Alertness: Awake/alert Orientation Level: Oriented X4 Attention: Focused, Sustained Focused Attention: Appears intact Sustained Attention: Appears intact Memory: Appears intact (in tact for exercises; pt reported short term recall deficits at baseline) Awareness: Appears intact Problem Solving: Appears intact Safety/Judgment: Appears intact Cognition Arousal: Alert Behavior During Therapy: Flat affect Overall Cognitive Status: History of cognitive impairments - at baseline General Comments: following all commands and making appropraite conversation     Blood pressure (!) 157/83, pulse 78, temperature 98.7 F (37.1 C), temperature source Oral, resp. rate 18, weight 61.8 kg, last menstrual period 05/22/2018, SpO2 100%. Physical Exam Vitals and nursing note reviewed.  Constitutional:      General: She is not in acute distress.    Appearance: Normal appearance. She is normal weight.     Comments: Pt awake, alert, appropriate, appears to  make eye contact, but then looks away abruptly due to visual disturbance, NAD- sitting up in bed   HENT:     Head: Normocephalic and atraumatic.     Comments: Smile not quite even- actual appears to be drooping slightly on R, not L.  Tongue midline Intact to Light touch on face    Right Ear: External ear normal.     Left Ear: External ear normal.     Nose: Nose normal. No congestion.     Mouth/Throat:     Mouth: Mucous membranes are dry.     Pharynx: Oropharynx is clear. No oropharyngeal exudate.  Eyes:     Comments: Crusted drainage around left eye Severe nystagmus with L eye- EOMI with cues, but horizontal nysgmus made it difficult for her to focus on 1 object  Neck:     Comments: Tight scalenes, upper traps on L Cardiovascular:     Rate and Rhythm: Normal rate and regular rhythm.     Heart sounds: Normal heart sounds. No murmur heard.    No gallop.  Pulmonary:     Effort: Pulmonary effort is normal. No respiratory distress.     Breath sounds: Normal breath sounds. No wheezing, rhonchi or rales.  Abdominal:     General: There is distension.     Tenderness: There is no abdominal tenderness.     Comments: Hypoactive BS Somewhat distended vs protuberant NT Pretty soft  Musculoskeletal:     Cervical back: Neck supple.     Comments: RUE- biceps 4/5; Triceps 4/5; WE 5-/5; Grip 4+/5 and FA 4+/5 LUE- 5-/5 in same muscles RLE- 4+/5 in HF, KE, KF, DF and PF LLE- 5-/5 in all muscles tested  Skin:    General: Skin is warm and dry.  Neurological:  Mental Status: She is alert and oriented to person, place, and time.     Comments: Speech clear. Able to follow simple one and two step motor commands. Tends to close left eye to focus. Dysconjugate gaze on left. Decrease in fine motor movement RUE. Intact to light touch in LUE and B/L LE's- however pt said "unable to tell" if intact sensation in RUE- tested 2 different times   Psychiatric:     Comments: Slightly flat- appears to have given up somewhat        Lab Results Last 48 Hours        Results for orders placed or  performed during the hospital encounter of 04/13/23 (from the past 48 hour(s))  Glucose, capillary     Status: Abnormal    Collection Time: 04/15/23 12:27 PM  Result Value Ref Range    Glucose-Capillary 218 (H) 70 - 99 mg/dL      Comment: Glucose reference range applies only to samples taken after fasting for at least 8 hours.    Comment 1 Notify RN      Comment 2 Document in Chart    Glucose, capillary     Status: None    Collection Time: 04/15/23  4:25 PM  Result Value Ref Range    Glucose-Capillary 71 70 - 99 mg/dL      Comment: Glucose reference range applies only to samples taken after fasting for at least 8 hours.  Urinalysis, Routine w reflex microscopic -Urine, Clean Catch     Status: Abnormal    Collection Time: 04/15/23  9:13 PM  Result Value Ref Range    Color, Urine YELLOW YELLOW    APPearance CLOUDY (A) CLEAR    Specific Gravity, Urine 1.011 1.005 - 1.030    pH 5.0 5.0 - 8.0    Glucose, UA NEGATIVE NEGATIVE mg/dL    Hgb urine dipstick NEGATIVE NEGATIVE    Bilirubin Urine NEGATIVE NEGATIVE    Ketones, ur NEGATIVE NEGATIVE mg/dL    Protein, ur 409 (A) NEGATIVE mg/dL    Nitrite NEGATIVE NEGATIVE    Leukocytes,Ua LARGE (A) NEGATIVE    RBC / HPF 6-10 0 - 5 RBC/hpf    WBC, UA >50 0 - 5 WBC/hpf    Bacteria, UA MANY (A) NONE SEEN    Squamous Epithelial / HPF 0-5 0 - 5 /HPF    WBC Clumps PRESENT        Comment: Performed at [facility], [address], [place], [place] [address]  Glucose, capillary     Status: Abnormal    Collection Time: 04/15/23  9:24 PM  Result Value Ref Range    Glucose-Capillary 178 (H) 70 - 99 mg/dL      Comment: Glucose reference range applies only to samples taken after fasting for at least 8 hours.  Glucose, capillary     Status: Abnormal    Collection Time: 04/16/23  6:25 AM  Result Value Ref Range    Glucose-Capillary 122 (H) 70 - 99 mg/dL      Comment: Glucose reference range applies only to samples taken after fasting for at least 8  hours.  Glucose, capillary     Status: Abnormal    Collection Time: 04/16/23 12:21 PM  Result Value Ref Range    Glucose-Capillary 109 (H) 70 - 99 mg/dL      Comment: Glucose reference range applies only to samples taken after fasting for at least 8 hours.    Comment 1 Notify RN  Glucose, capillary     Status: Abnormal    Collection Time: 04/16/23  4:20 PM  Result Value Ref Range    Glucose-Capillary 162 (H) 70 - 99 mg/dL      Comment: Glucose reference range applies only to samples taken after fasting for at least 8 hours.    Comment 1 Notify RN    Glucose, capillary     Status: Abnormal    Collection Time: 04/16/23  9:10 PM  Result Value Ref Range    Glucose-Capillary 148 (H) 70 - 99 mg/dL      Comment: Glucose reference range applies only to samples taken after fasting for at least 8 hours.      Imaging Results (Last 48 hours)  No results found.         Blood pressure (!) 157/83, pulse 78, temperature 98.7 F (37.1 C), temperature source Oral, resp. rate 18, weight 61.8 kg, last menstrual period 05/22/2018, SpO2 100%.   Medical Problem List and Plan: 1. Functional deficits secondary to R frontoparietal ischemic infarct             -patient may  shower             -ELOS/Goals: mod I to supervision 14-16 days             Admit to CIR 2.  Antithrombotics: -DVT/anticoagulation:  Pharmaceutical: Lovenox             -antiplatelet therapy: ASA/Brilinta.  3. Pain Management: Tylenol for pain prn. 4. Mood/Behavior/Sleep: Melatonin prn.             -antipsychotic agents: LCSW to follow for evaluation and support.  5. Neuropsych/cognition: This patient is capable of making decisions on her own behalf. 6. Skin/Wound Care: Routine pressure relief measures.  7. Fluids/Electrolytes/Nutrition: Monitor I/O. Check CMET in am 8. HTN: Monitor BP TID. On Coreg and amlodipine.              --Continues to be limited by dizziness--monitor for orthostatic changes.              -pt emphatic  was using pill packs to do meds/bubble packs and says was compliant with meds? 9. T2DM w/retinopathy and neuropathy:  Hgb A1c- 6.1 and well controlled. -- Monitor BS ac/hs and use SSI for elevated BS.  --Was on monjuaro at home 10. 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 Limits fluids to 64 ounces otherwise on regular diet             --resume Bicarb for NAGMA.  11. OSA: Compliant with CPAP 12. H/o Depression: On Prozac--motivated and remains positive.  13. Constipation: will add miralax. If doesn't go by this evening, will add Sorbitol- scared to push  14. Anemia of chronic disease: Monitor H/H with serial checks 15. Leucocytosis: Monitor for fevers and other signs of infection.              --recheck WBC in am.            Jacquelynn Cree, PA-C 04/17/2023   I have personally performed a face to face diagnostic evaluation of this patient and formulated the key components of the plan.  Additionally, I have personally reviewed laboratory data, imaging studies, as well as relevant notes and concur with the physician assistant's documentation above.   The patient's status has not changed from the original H&P.  Any changes in documentation from the acute care chart have been noted above.

## 2023-04-17 NOTE — Progress Notes (Addendum)
Inpatient Rehabilitation Admission Medication Review by a Pharmacist  A complete drug regimen review was completed for this patient to identify any potential clinically significant medication issues.  High Risk Drug Classes Is patient taking? Indication by Medication  Antipsychotic Yes Compazine - N/V  Anticoagulant Yes Lovenox - DVT px  Antibiotic No   Opioid No   Antiplatelet Yes ASA/Brilinta - CVA  Hypoglycemics/insulin Yes Ozempic - DM  Vasoactive Medication Yes Norvasc/coreg - HTN  Chemotherapy No   Other Yes Melatonin - sleep Albuterol - short of breath Atorvastatin - HLD Protonix - GERD Prozac - depression Singulair - asthma Sodium bicarb - acidosis     Type of Medication Issue Identified Description of Issue Recommendation(s)  Drug Interaction(s) (clinically significant)     Duplicate Therapy     Allergy     No Medication Administration End Date     Incorrect Dose     Additional Drug Therapy Needed     Significant med changes from prior encounter (inform family/care partners about these prior to discharge).    Other  Flonase Resume as needed in CIR or at discharge    Clinically significant medication issues were identified that warrant physician communication and completion of prescribed/recommended actions by midnight of the next day:  No  Name of provider notified for urgent issues identified:   Provider Method of Notification:     Pharmacist comments:   Time spent performing this drug regimen review (minutes):  20   Ulyses Southward, PharmD, [place], AAHIVP, CPP Infectious Disease Pharmacist 04/17/2023 12:56 PM

## 2023-04-17 NOTE — Progress Notes (Signed)
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 Pt with hx including but not limited to DM, HTN, CKD, anemia, PVD, prior CVAs with R hemiparesis   OT comments  Pt progressing towards goals, reports improvement with occlusion glasses in terms of diplopia, still feels deficits in L visual field. Pt reports no diplopia with glasses doffed for brief period, educated pt on donning glasses if diplopia reoccurs. Pt able to complete standing and seated functional therex for RUE, encouraged continued use of RUE during functional tasks to improve strength/mobility. Pt presenting with impairments listed below, will follow acutely. Patient will benefit from intensive inpatient follow up therapy, >3 hours/day to maximize safety/ind with ADLs/functional mobility.       If plan is discharge home, recommend the following:  Two people to help with walking and/or transfers;A lot of help with walking and/or transfers;A lot of help with bathing/dressing/bathroom;Assistance with cooking/housework;Assist for transportation;Assistance with feeding   Equipment Recommendations  Other (comment) (defer)    Recommendations for Other Services Rehab consult    Precautions / Restrictions Precautions Precautions: Fall Precaution Comments: dizzy/N/V Restrictions Weight Bearing Restrictions: No       Mobility Bed Mobility Overal bed mobility: Needs Assistance Bed Mobility: Supine to Sit, Sit to Supine     Supine to sit: Contact guard Sit to supine: Contact guard assist        Transfers Overall transfer level: Needs assistance Equipment used: Rolling walker (2 wheels) Transfers:  Sit to/from Stand Sit to Stand: Contact guard assist                 Balance Overall balance assessment: Needs assistance Sitting-balance support: Single extremity supported Sitting balance-Leahy Scale: Good     Standing balance support: Bilateral upper extremity supported, During functional activity, Reliant on assistive device for balance Standing balance-Leahy Scale: Poor Standing balance comment: reliant on UE support in standing                           ADL either performed or assessed with clinical judgement   ADL Overall ADL's : Needs assistance/impaired                     Lower Body Dressing: Contact guard assist Lower Body Dressing Details (indicate cue type and reason): using LUE only (compensatory strategy) Toilet Transfer: Contact guard assist;Ambulation;Rolling walker (2 wheels)           Functional mobility during ADLs: Contact guard assist;Rolling walker (2 wheels)      Extremity/Trunk Assessment Upper Extremity Assessment Upper Extremity Assessment: Right hand dominant RUE Deficits / Details: globally weak, shoulder ROM decr grasp and [facility]   Lower Extremity Assessment Lower Extremity Assessment: Defer to PT evaluation        Vision   Vision Assessment?: Vision impaired- to be further tested in functional context Additional Comments: pt with L occlusion glasses with 1 piece of tape on nasal portion, today when glasses removed pt reports diplopia has resolved and is only "seeing one" but still has deficits in L eye (hx of retinopathy) and reports things look "pale" with R eye occluded, noted some nystagmus in L eye. Pt  wanting to leave occlusion glasses off for time being since diplopia has resided, educated pt on donning glasses if diplopia recurs   Perception Perception Perception: Not tested   Praxis Praxis Praxis: Not tested    Cognition Arousal: Alert Behavior During Therapy: Flat affect Overall Cognitive Status:  History of cognitive impairments - at baseline                                 General Comments: following all commands and making appropraite conversation        Exercises Exercises: Other exercises Other Exercises Other Exercises: standing wall circles with washcloth RUE Other Exercises: standing reaching/drawing/erasing task RUE Other Exercises: ripping paper/small packets BUE    Shoulder Instructions       General Comments VSS    Pertinent Vitals/ Pain       Pain Assessment Pain Assessment: Faces Pain Score: 2  Faces Pain Scale: Hurts a little bit Pain Location: gneral chronic pain Pain Descriptors / Indicators: Discomfort Pain Intervention(s): Limited activity within patient's tolerance, Monitored during session, Repositioned  Home Living                                          Prior Functioning/Environment              Frequency  Min 1X/week        Progress Toward Goals  OT Goals(current goals can now be found in the care plan section)  Progress towards OT goals: Progressing toward goals  Acute Rehab OT Goals Patient Stated Goal: to get to rehab OT Goal Formulation: With patient Time For Goal Achievement: 04/28/23 Potential to Achieve Goals: Good ADL Goals Pt Will Perform Upper Body Dressing: with supervision;sitting Pt Will Perform Lower Body Dressing: with supervision;sitting/lateral leans;sit to/from stand Pt Will Transfer to Toilet: with supervision;ambulating;regular height toilet Pt Will Perform Tub/Shower Transfer: Tub transfer;Shower transfer;with supervision;ambulating  Plan      Co-evaluation                 AM-PAC OT "6 Clicks" Daily Activity     Outcome Measure   Help from another person eating meals?: A Little Help from another person taking care of personal grooming?: A Little Help from another person toileting, which includes using toliet, bedpan, or urinal?: A Little Help from another  person bathing (including washing, rinsing, drying)?: A Lot Help from another person to put on and taking off regular upper body clothing?: A Little Help from another person to put on and taking off regular lower body clothing?: A Little 6 Click Score: 17    End of Session Equipment Utilized During Treatment: Gait belt;Rolling walker (2 wheels)  OT Visit Diagnosis: Unsteadiness on feet (R26.81);History of falling (Z91.81);Hemiplegia and hemiparesis;Other symptoms and signs involving the nervous system (R29.898);Low vision, both eyes (H54.2) Hemiplegia - Right/Left: Right Hemiplegia - dominant/non-dominant: Dominant Hemiplegia - caused by: Cerebral infarction   Activity Tolerance Patient tolerated treatment well   Patient Left in bed;with call bell/phone within reach;with bed alarm set   Nurse Communication Mobility status        Time: 4098-1191 OT Time Calculation (min): 30 min  Charges: OT General Charges $OT Visit: 1 Visit OT Treatments $Self Care/Home Management : 8-22 mins $Therapeutic Activity: 8-22 mins  Carver Fila, OTD, OTR/L SecureChat Preferred Acute Rehab (336)  [phone]   Dalphine Handing 04/17/2023, 9:35 AM

## 2023-04-17 NOTE — TOC Transition Note (Signed)
Transition of Care [facility]) - CM/SW Discharge Note   Patient Details  Name: Christina Orozco MRN: [id number] Date of Birth: 1970-12-22  Transition of Care [facility]) CM/SW Contact:  Kermit Balo, RN Phone Number: 04/17/2023, 11:29 AM   Clinical Narrative:     Patient is discharging to CIR today. CM signing off.   Final next level of care: IP Rehab Facility Barriers to Discharge: No Barriers Identified   Patient Goals and CMS Choice CMS Medicare.gov Compare Post Acute Care list provided to:: Patient Choice offered to / list presented to : Patient  Discharge Placement                         Discharge Plan and Services Additional resources added to the After Visit Summary for                                       Social Determinants of Health (SDOH) Interventions SDOH Screenings   Food Insecurity: Low Risk  (02/27/2023)   Received from [facility]  Housing: Low Risk  (10/28/2022)  Transportation Needs: No Transportation Needs (02/27/2023)   Received from [facility]  Utilities: Low Risk  (02/27/2023)   Received from [facility]  Depression (PHQ2-9): Low Risk  (10/10/2022)  Financial Resource Strain: Medium Risk (09/06/2022)   Received from [facility], [facility]  Social Connections: Unknown (10/10/2021)   Received from [facility], [facility]  Tobacco Use: Low Risk  (04/14/2023)     Readmission Risk Interventions     No data to display

## 2023-04-18 DIAGNOSIS — N179 Acute kidney failure, unspecified: Secondary | ICD-10-CM | POA: Diagnosis not present

## 2023-04-18 DIAGNOSIS — N184 Chronic kidney disease, stage 4 (severe): Secondary | ICD-10-CM | POA: Diagnosis not present

## 2023-04-18 DIAGNOSIS — I63311 Cerebral infarction due to thrombosis of right middle cerebral artery: Secondary | ICD-10-CM | POA: Diagnosis not present

## 2023-04-18 DIAGNOSIS — D649 Anemia, unspecified: Secondary | ICD-10-CM | POA: Diagnosis not present

## 2023-04-18 LAB — COMPREHENSIVE METABOLIC PANEL
ALT: 31 U/L (ref 0–44)
AST: 24 U/L (ref 15–41)
Albumin: 3.2 g/dL — ABNORMAL LOW (ref 3.5–5.0)
Alkaline Phosphatase: 76 U/L (ref 38–126)
Anion gap: 7 (ref 5–15)
BUN: 49 mg/dL — ABNORMAL HIGH (ref 6–20)
CO2: 20 mmol/L — ABNORMAL LOW (ref 22–32)
Calcium: 9 mg/dL (ref 8.9–10.3)
Chloride: 111 mmol/L (ref 98–111)
Creatinine, Ser: 3 mg/dL — ABNORMAL HIGH (ref 0.44–1.00)
GFR, Estimated: 18 mL/min — ABNORMAL LOW (ref >60)
Glucose, Bld: 136 mg/dL — ABNORMAL HIGH (ref 70–99)
Potassium: 3.9 mmol/L (ref 3.5–5.1)
Sodium: 138 mmol/L (ref 135–145)
Total Bilirubin: 0.4 mg/dL (ref <1.2)
Total Protein: 6.3 g/dL — ABNORMAL LOW (ref 6.5–8.1)

## 2023-04-18 LAB — CBC WITH DIFFERENTIAL/PLATELET
Abs Immature Granulocytes: 0.01 10*3/uL (ref 0.00–0.07)
Basophils Absolute: 0 10*3/uL (ref 0.0–0.1)
Basophils Relative: 1 %
Eosinophils Absolute: 0.1 10*3/uL (ref 0.0–0.5)
Eosinophils Relative: 2 %
HCT: 23.9 % — ABNORMAL LOW (ref 36.0–46.0)
Hemoglobin: 7.7 g/dL — ABNORMAL LOW (ref 12.0–15.0)
Immature Granulocytes: 0 %
Lymphocytes Relative: 30 %
Lymphs Abs: 1.8 10*3/uL (ref 0.7–4.0)
MCH: 28.6 pg (ref 26.0–34.0)
MCHC: 32.2 g/dL (ref 30.0–36.0)
MCV: 88.8 fL (ref 80.0–100.0)
Monocytes Absolute: 0.5 10*3/uL (ref 0.1–1.0)
Monocytes Relative: 8 %
Neutro Abs: 3.6 10*3/uL (ref 1.7–7.7)
Neutrophils Relative %: 59 %
Platelets: 190 10*3/uL (ref 150–400)
RBC: 2.69 MIL/uL — ABNORMAL LOW (ref 3.87–5.11)
RDW: 14.5 % (ref 11.5–15.5)
WBC: 6 10*3/uL (ref 4.0–10.5)
nRBC: 0 % (ref 0.0–0.2)

## 2023-04-18 LAB — GLUCOSE, CAPILLARY
Glucose-Capillary: 117 mg/dL — ABNORMAL HIGH (ref 70–99)
Glucose-Capillary: 116 mg/dL — ABNORMAL HIGH (ref 70–99)
Glucose-Capillary: 100 mg/dL — ABNORMAL HIGH (ref 70–99)

## 2023-04-18 MED ORDER — SODIUM CHLORIDE 0.45 % IV BOLUS
1000.0000 mL | Freq: Once | INTRAVENOUS | Status: AC
  Administered 2023-04-18: 1000 mL via INTRAVENOUS

## 2023-04-18 NOTE — Patient Care Conference (Signed)
Inpatient RehabilitationTeam Conference and Plan of Care Update Date: 04/18/2023   Time: 10:13 AM    Patient Name: Christina Orozco      Medical Record Number: [id number]  Date of Birth: 02-Apr-1971 Sex: Female         Room/Bed: 4W10C/4W10C-01 Payor Info: Payor: Advertising copywriter MEDICARE / Plan: Suan Halter DUAL COMPLETE / Product Type: *No Product type* /    Admit Date/Time:  04/17/2023 12:56 PM  Primary Diagnosis:  Stroke (cerebrum) [facility])  Hospital Problems: Principal Problem:   Stroke (cerebrum) [facility])    Expected Discharge Date: Expected Discharge Date:  (Evals pending)  Team Members Present: Physician leading conference: Dr. Claudette Laws Social Worker Present: Lavera Guise, BSW Nurse Present: Chana Bode, RN PT Present: Ralph Leyden, PT OT Present: Primitivo Gauze, OT SLP Present: Everardo Pacific, SLP PPS Coordinator present : Fae Pippin, SLP     Current Status/Progress Goal Weekly Team Focus  Bowel/Bladder      Continent of bowel and bladder          Swallow/Nutrition/ Hydration               ADL's      Evals pending          Mobility   Bed mobility = sup/ Mod I, transfers = supervision/ CGA, ambulation = long distance using RW with CGA for vision deficits   overall Mod I/ sup  Barriers: vision, mild ataxia // Work on: Geophysical data processor,    Building services engineer Observations               Pain     N/a           Skin     N/a             Discharge Planning:   Home alone; Brother calls daily and comes by weekly. Uses access [place] for transportation to get groceries and attend appointments.  Team Discussion: Patient with history of multiple strokes and post stroke on Brilinta with worsening symptoms of CKD and drop in Hgb.  Pons like visual deficits with extra ocular muscular issues and double vision.  Limited by previous left visual deficits, ataxia and decreased  awareness of deficits.  Patient on target to meet rehab goals: Evals pending  *See Care Plan and progress notes for long and short-term goals.   Revisions to Treatment Plan:  N/a   Teaching Needs: Safety, medication, dietary modification, transfers and toileting, etc.   Current Barriers to Discharge: Lack of/limited family support  Possible Resolutions to Barriers: TBD     Medical Summary Current Status: recurrent CVAs, acute on chronic anemia, acute on chronic renal failure  Barriers to Discharge: Renal Insufficiency/Failure   Possible Resolutions to [employer] Focus: may need nephro consult, workup on anemia,   Continued Need for Acute Rehabilitation Level of Care: The patient requires daily medical management by a physician with specialized training in physical medicine and rehabilitation for the following reasons: Direction of a multidisciplinary physical rehabilitation program to maximize functional independence : Yes Medical management of patient stability for increased activity during participation in an intensive rehabilitation regime.: Yes Analysis of laboratory values and/or radiology reports with any subsequent need for medication adjustment and/or medical intervention. : Yes   I attest that I was present, lead the team conference, and concur with the assessment and plan of the team.   Pamelia Hoit 04/18/2023,  1:52 PM

## 2023-04-18 NOTE — Plan of Care (Signed)
Problem: RH Balance Goal: LTG Patient will maintain dynamic standing with ADLs (OT) Description: LTG:  Patient will maintain dynamic standing balance with assist during activities of daily living (OT)  Flowsheets (Taken 04/18/2023 1259) LTG: Pt will maintain dynamic standing balance during ADLs with: Independent with assistive device   Problem: Sit to Stand Goal: LTG:  Patient will perform sit to stand in prep for activites of daily living with assistance level (OT) Description: LTG:  Patient will perform sit to stand in prep for activites of daily living with assistance level (OT) Flowsheets (Taken 04/18/2023 1259) LTG: PT will perform sit to stand in prep for activites of daily living with assistance level: Independent with assistive device   Problem: RH Eating Goal: LTG Patient will perform eating w/assist, cues/equip (OT) Description: LTG: Patient will perform eating with assist, with/without cues using equipment (OT) Flowsheets (Taken 04/18/2023 1259) LTG: Pt will perform eating with assistance level of: Independent   Problem: RH Grooming Goal: LTG Patient will perform grooming w/assist,cues/equip (OT) Description: LTG: Patient will perform grooming with assist, with/without cues using equipment (OT) Flowsheets (Taken 04/18/2023 1259) LTG: Pt will perform grooming with assistance level of: Independent   Problem: RH Bathing Goal: LTG Patient will bathe all body parts with assist levels (OT) Description: LTG: Patient will bathe all body parts with assist levels (OT) Flowsheets (Taken 04/18/2023 1259) LTG: Pt will perform bathing with assistance level/cueing: Independent with assistive device  LTG: Position pt will perform bathing: Shower   Problem: RH Dressing Goal: LTG Patient will perform upper body dressing (OT) Description: LTG Patient will perform upper body dressing with assist, with/without cues (OT). Flowsheets (Taken 04/18/2023 1259) LTG: Pt will perform upper body dressing  with assistance level of: Independent Goal: LTG Patient will perform lower body dressing w/assist (OT) Description: LTG: Patient will perform lower body dressing with assist, with/without cues in positioning using equipment (OT) Flowsheets (Taken 04/18/2023 1259) LTG: Pt will perform lower body dressing with assistance level of: Independent with assistive device   Problem: RH Toileting Goal: LTG Patient will perform toileting task (3/3 steps) with assistance level (OT) Description: LTG: Patient will perform toileting task (3/3 steps) with assistance level (OT)  Flowsheets (Taken 04/18/2023 1259) LTG: Pt will perform toileting task (3/3 steps) with assistance level: Independent with assistive device   Problem: RH Simple Meal Prep Goal: LTG Patient will perform simple meal prep w/assist (OT) Description: LTG: Patient will perform simple meal prep with assistance, with/without cues (OT). Flowsheets (Taken 04/18/2023 1259) LTG: Pt will perform simple meal prep with assistance level of: Independent with assistive device LTG: Pt will perform simple meal prep w/level of: Ambulate with device   Problem: RH Light Housekeeping Goal: LTG Patient will perform light housekeeping w/assist (OT) Description: LTG: Patient will perform light housekeeping with assistance, with/without cues (OT). Flowsheets (Taken 04/18/2023 1259) LTG: Pt will perform light housekeeping with assistance level of: Independent with assistive device LTG: Pt will perform light housekeeping w/level of: Ambulate with device   Problem: RH Toilet Transfers Goal: LTG Patient will perform toilet transfers w/assist (OT) Description: LTG: Patient will perform toilet transfers with assist, with/without cues using equipment (OT) Flowsheets (Taken 04/18/2023 1259) LTG: Pt will perform toilet transfers with assistance level of: Independent with assistive device   Problem: RH Tub/Shower Transfers Goal: LTG Patient will perform tub/shower  transfers w/assist (OT) Description: LTG: Patient will perform tub/shower transfers with assist, with/without cues using equipment (OT) Flowsheets (Taken 04/18/2023 1259) LTG: Pt will perform tub/shower stall transfers  with assistance level of: Independent with assistive device LTG: Pt will perform tub/shower transfers from: Tub/shower combination

## 2023-04-18 NOTE — Progress Notes (Signed)
Inpatient Rehabilitation  Patient information reviewed and entered into eRehab system by Demarrio Menges Gentry, OTR/L, Rehab Quality Coordinator.   Information including medical coding, functional ability and quality indicators will be reviewed and updated through discharge.   

## 2023-04-18 NOTE — Progress Notes (Signed)
Inpatient Rehabilitation Center Individual Statement of Services  Patient Name:  Christina Orozco  Date:  04/18/2023  Welcome to the Inpatient Rehabilitation Center.  Our goal is to provide you with an individualized program based on your diagnosis and situation, designed to meet your specific needs.  With this comprehensive rehabilitation program, you will be expected to participate in at least 3 hours of rehabilitation therapies Monday-Friday, with modified therapy programming on the weekends.  Your rehabilitation program will include the following services:  Physical Therapy (PT), Occupational Therapy (OT), Speech Therapy (ST), 24 hour per day rehabilitation nursing, Therapeutic Recreaction (TR), Neuropsychology, Care Coordinator, Rehabilitation Medicine, Nutrition Services, Pharmacy Services, and Other  Weekly team conferences will be held on Wednesdays to discuss your progress.  Your Inpatient Rehabilitation Care Coordinator will talk with you frequently to get your input and to update you on team discussions.  Team conferences with you and your family in attendance may also be held.  Expected length of stay: 14-16 days   Overall anticipated outcome:  MOD I  Depending on your progress and recovery, your program may change. Your Inpatient Rehabilitation Care Coordinator will coordinate services and will keep you informed of any changes. Your Inpatient Rehabilitation Care Coordinator's name and contact numbers are listed  below.  The following services may also be recommended but are not provided by the Inpatient Rehabilitation Center:   Home Health Rehabiltiation Services Outpatient Rehabilitation Services    Arrangements will be made to provide these services after discharge if needed.  Arrangements include referral to agencies that provide these services.  Your insurance has been verified to be: [facility] MEDICARE   Your primary doctor is:  Kerin Salen, PA-C  Pertinent  information will be shared with your doctor and your insurance company.  Inpatient Rehabilitation Care Coordinator:  Lavera Guise, [place] [phone] or [phone]  Information discussed with and copy given to patient by: Andria Rhein, 04/18/2023, 9:12 AM

## 2023-04-18 NOTE — Progress Notes (Signed)
PROGRESS NOTE   Subjective/Complaints:  Double vision when looking toward the left , improves with closing one eye, discussed recurrent CVA , discussed worsening renal status  ROS Objective:   No results found. Recent Labs    04/18/23 0531  WBC 6.0  HGB 7.7*  HCT 23.9*  PLT 190   Recent Labs    04/18/23 0531  NA 138  K 3.9  CL 111  CO2 20*  GLUCOSE 136*  BUN 49*  CREATININE 3.00*  CALCIUM 9.0    Intake/Output Summary (Last 24 hours) at 04/18/2023 0937 Last data filed at 04/18/2023 0854 Gross per 24 hour  Intake 720 ml  Output --  Net 720 ml        Physical Exam: Vital Signs Blood pressure (!) 142/80, pulse 73, temperature 98.8 F (37.1 C), temperature source Oral, resp. rate 18, height 5\' 2"  (1.575 m), weight 60 kg, last menstrual period 05/22/2018, SpO2 100%.   General: No acute distress Mood and affect are appropriate Heart: Regular rate and rhythm no rubs murmurs or extra sounds Lungs: Clear to auscultation, breathing unlabored, no rales or wheezes Abdomen: Positive bowel sounds, soft nontender to palpation, nondistended Extremities: No clubbing, cyanosis, or edema Skin: No evidence of breakdown, no evidence of rash Neurologic: Cranial nerves left CN 6 palsy , motor strength is 4/5 in bilateral deltoid, bicep, tricep, grip, hip flexor, knee extensors, ankle dorsiflexor and plantar flexor Sensory exam normal sensation to light touch  in bilateral upper and lower extremities Cerebellar exam mild dysmetria finger to nose to finger as well as heel to shin in bilateral upper and lower extremities Musculoskeletal: Full range of motion in all 4 extremities. No joint swelling   Assessment/Plan: 1. Functional deficits which require 3+ hours per day of interdisciplinary therapy in a comprehensive inpatient rehab setting. Physiatrist is providing close team supervision and 24 hour management of active medical  problems listed below. Physiatrist and rehab team continue to assess barriers to discharge/monitor patient progress toward functional and medical goals  Care Tool:  Bathing              Bathing assist       Upper Body Dressing/Undressing Upper body dressing        Upper body assist      Lower Body Dressing/Undressing Lower body dressing            Lower body assist       Toileting Toileting    Toileting assist       Transfers Chair/bed transfer  Transfers assist           Locomotion Ambulation   Ambulation assist              Walk 10 feet activity   Assist           Walk 50 feet activity   Assist           Walk 150 feet activity   Assist           Walk 10 feet on uneven surface  activity   Assist           Wheelchair     Assist  Wheelchair 50 feet with 2 turns activity    Assist            Wheelchair 150 feet activity     Assist          Blood pressure (!) 142/80, pulse 73, temperature 98.8 F (37.1 C), temperature source Oral, resp. rate 18, height 5\' 2"  (1.575 m), weight 60 kg, last menstrual period 05/22/2018, SpO2 100%.  Medical Problem List and Plan: 1. 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  Antithrombotics: -DVT/anticoagulation:  Pharmaceutical: Lovenox             -antiplatelet therapy: ASA/Brilinta.  3. Pain Management: Tylenol for pain prn. 4. Mood/Behavior/Sleep: Melatonin prn.             -antipsychotic agents: LCSW to follow for evaluation and support.  5. Neuropsych/cognition: This patient is capable of making decisions on her own behalf. 6. Skin/Wound  Care: Routine pressure relief measures.  7. Fluids/Electrolytes/Nutrition: Monitor I/O. Check CMET in am 8. HTN: Monitor BP TID. On Coreg and amlodipine.              --Continues to be limited by dizziness--monitor for orthostatic changes.              -pt emphatic was using pill packs to do meds/bubble packs and says was compliant with meds? Vitals:   04/17/23 2013 04/18/23 0318  BP: (!) 152/83 (!) 142/80  Pulse: 79 73  Resp: 18   Temp: 98.8 F (37.1 C)   SpO2: 100% 100%    9. T2DM w/retinopathy and neuropathy:  Hgb A1c- 6.1 and well controlled. -- Monitor BS ac/hs and use SSI for elevated BS.  --Was on monjuaro at home 10. 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 Limits fluids to 64 ounces otherwise on regular diet             --resume Bicarb for NAGMA.     Latest Ref Rng & Units 04/18/2023    5:31 AM 04/14/2023   11:35 AM 04/13/2023   10:30 PM  BMP  Glucose 70 - 99 mg/dL 161  096  045   BUN 6 - 20 mg/dL 49  32  34   Creatinine 0.44 - 1.00 mg/dL 4.09  8.11  9.14   Sodium 135 - 145 mmol/L 138  143  141   Potassium 3.5 - 5.1 mmol/L 3.9  3.5  3.7   Chloride 98 - 111 mmol/L 111  114  111   CO2 22 - 32 mmol/L 20  19  18    Calcium 8.9 - 10.3 mg/dL 9.0  9.7  9.4   Increased BUN and Creat will give IVF and recheck in am not on ACE-I or diuretics , if pt does not respond to diuretics may need nephro input  11. OSA: Compliant with CPAP 12. H/o Depression: On Prozac--motivated and remains positive.  13. Constipation: will add miralax. If doesn't go by this evening, will add Sorbitol- scared to push  14. Anemia of chronic disease: Monitor H/H with serial checks, Hgb  dropped  15. Leucocytosis: Monitor for fevers and other signs of infection.              --recheck WBC in am.      Latest Ref Rng & Units 04/18/2023    5:31 AM 04/14/2023   11:35 AM 04/13/2023   10:30 PM  CBC  WBC 4.0 - 10.5 K/uL 6.0  10.8  10.3   Hemoglobin 12.0 - 15.0 g/dL 7.7  9.4  9.3   Hematocrit 36.0 - 46.0 % 23.9  28.8  28.8    Platelets 150 - 400 K/uL 190  290  273    ? GI blood loss vs chronic kidney , check stool guaic   LOS: 1 days A FACE TO FACE EVALUATION WAS PERFORMED  Erick Colace 04/18/2023, 9:37 AM

## 2023-04-18 NOTE — Progress Notes (Signed)
Physical Therapy Session Note  Patient Details  Name: Christina Orozco MRN: [id number] Date of Birth: 1970-12-12  Today's Date: 04/18/2023 PT Individual Time: 1355-1510 PT Individual Time Calculation (min): 75 min   Short Term Goals: Week 1:     Skilled Therapeutic Interventions/Progress Updates: Patient supine in bed with CPAP donned on entrance to room. Patient alert and agreeable to PT session.   PTA discussed with attending PT about pt's goals and current presentation prior to session (pt evaluated same day).  Patient reported no pain at beginning of PT session. Beginning of session focused on building pt rapport. Pt ready to get out and get home. Pt reported that double vision has gotten better but still has deficits (PTA stood to the L of pt (anteriorly) and pt reported only seeing a shadow.   Therapeutic Activity: Bed Mobility: Pt performed supine<>sit on EOB with supervision (HOB slightly elevated).  Transfers: Pt performed sit<>stand transfers throughout session with RW or no AD with supervision. Provided VC for hand placement when standing for power-up.  - Pt ambulated to bathroom in RW at end of session when on the way back from day room. Pt with behind back supervision for pt to doff lower body clothing, then closed door supervision while pt on toilet with bladder void for privacy. Pt finished and stood with CGA and donned personal clothing (lower body), then ambulated in RW to sink to wash hands (close supervision).  Patient demonstrates increased fall risk as noted by score of 26 /56 on Berg Balance Scale.  (<36= high risk for falls, close to 100%; 37-45 significant >80%; 46-51 moderate >50%; 52-55 lower >25%)  - Pt reported increase in dizziness with several measurements during BERG (360* turn and narrow BOS stance). Pt noted to have nystagmus in L eye (Orozco update rehab team) when pt cued to follow PTA finger horizontally  Gait Training:  Pt ambulated from room<>day room  gym using RW with CGA/light minA. Pt demonstrated the following gait deviations with therapist providing the described cuing and facilitation for improvement:  - Decreased step clearance on L LE with VC to increase height - Decrease BOS - Mild unsteadiness to the L with no true LOB - lightly bumping into objects on R side (PTA on L side providing CGA/light minA prn).  Neuromuscular Re-ed: NMR facilitated during session with focus on dynamic standing balance/coordination. - Pt standing with instructions to toss horseshoe to target. Table to start facing table housing horseshoe's, then turn to the L 90*, toss and turn back 90* to the R to pick up another. Pt performed 4 throws and reported 5/10 in feeling dizzy (quickly felt better after sitting). Pt then performed same task but starting with turning to the R and back to the L to pick up horseshoes on table (pt reported 3/10 after tossing 4). Pt with CGA/minA with some instances of slight unsteadiness when turning after dizziness would increase. - Pt with L HHA and cues to step to 4" step starting with R with heavy minA throughout to maintain standing balance, and then light minA when stepping with the L (x 10 bilaterally). Pt reported feeling difficulty when stepping with the L (harder to coordinate step) vs R, but PTA noted pt to require increase assistance to step with the R in order to increase WB on L LE.  NMR performed for improvements in motor control and coordination, balance, sequencing, judgement, and self confidence/ efficacy in performing all aspects of mobility at highest level of independence.  Patient sitting EOB at end of session with brakes locked, bed alarm set, and all needs within reach.  Therapy Documentation Precautions:  Precautions Precautions: Fall Restrictions Weight Bearing Restrictions: No   Therapy/Group: Individual Therapy  Janeshia Ciliberto PTA 04/18/2023, 3:30 PM

## 2023-04-18 NOTE — Evaluation (Signed)
Occupational Therapy Assessment and Plan  Patient Details  Name: Christina Orozco MRN: [id number] Date of Birth: [birth year]  OT Diagnosis: disturbance of vision and hemiplegia affecting dominant side Rehab Potential: Rehab Potential (ACUTE ONLY): Good ELOS: 10-12 days   Today's Date: 04/18/2023 OT Individual Time: 1045-1150 OT Individual Time Calculation (min): 65 min     Hospital Problem: Principal Problem:   Stroke (cerebrum) (HCC)   Past Medical History:  Past Medical History:  Diagnosis Date   Anemia    severe   Anxiety    Asthma    CKD (chronic kidney disease)    stage IV   DDD (degenerative disc disease), cervical    Depression    Diabetes mellitus (HCC)    Dislocated shoulder    right   GERD (gastroesophageal reflux disease)    Headache    migraines (about once a month)   History of kidney stones    Hypertension    Neuropathy    Peripheral vascular disease (HCC)    Pneumonia    Stroke [facility])    Past Surgical History:  Past Surgical History:  Procedure Laterality Date   ADENOIDECTOMY     APPENDECTOMY     CERVICAL SPINE SURGERY  06/2012   C5-C6   EYE SURGERY     left eye surgery    FOOT SURGERY     KENALOG INJECTION Left 03/10/2021   Procedure: Intravitreal Avastin Injection;  Surgeon: Stephannie Li, MD;  Location: [facility] OR;  Service: Ophthalmology;  Laterality: Left;   LOOP RECORDER INSERTION N/A 08/27/2017   Procedure: LOOP RECORDER INSERTION;  Surgeon: Thurmon Fair, MD;  Location: [facility] INVASIVE CV LAB;  Service: Cardiovascular;  Laterality: N/A;   MEMBRANE PEEL Left 03/10/2021   Procedure: MEMBRANE PEEL;  Surgeon: Stephannie Li, MD;  Location: [facility] OR;  Service: Ophthalmology;  Laterality: Left;   PARS PLANA VITRECTOMY Left 03/10/2021   Procedure: TWENTY-FIVE GAUGE PARS PLANA VITRECTOMY;  Surgeon: Stephannie Li, MD;  Location: [facility] OR;  Service: Ophthalmology;  Laterality: Left;   PHOTOCOAGULATION WITH LASER Left 03/10/2021   Procedure: PHOTOCOAGULATION WITH  LASER;  Surgeon: Stephannie Li, MD;  Location: [facility] OR;  Service: Ophthalmology;  Laterality: Left;   SHOULDER SURGERY     TEE WITHOUT CARDIOVERSION N/A 08/27/2017   Procedure: TRANSESOPHAGEAL ECHOCARDIOGRAM (TEE);  Surgeon: Thurmon Fair, MD;  Location: [facility] ENDOSCOPY;  Service: Cardiovascular;  Laterality: N/A;   TONSILLECTOMY      Assessment & Plan Clinical Impression: Christina Orozco is a 52 year old  R handed (cannot use R hand anymore)  female with history of HTN, T2DM with retinopathy, neuropathy, MDD, CKD, OSA, anxiety/depression, multiple strokes last 08/30/22 with  right sided weakness and dysarthria and ongoing outpatient therapy. She was admitted on 04/13/23 after syncopal episode, intractable N/V and double vision left eye. MRI brain done revealing acute 7 mm subcortical infarct in right frontoparietal region and at right splenium with severe chronic small vessel disease and multiple chronic hemorrhages s/o chronic poorly controlled HTN. Case was discussed with  neurology who recommended continuing ASA, Brilinta and Atorvastatin as on maximal therapy. GI symptoms have resolved but she continues to be limited by decrease motor control RUE and diplopia on left. Therapy has been working with patient who is limited by dizziness and  requires CGA to min assist with mobility and ADLs. She was independent PTA and CIR recommended due to functional decline.    She lives alone and brother checks in weekly. Has frozen dinners due to poor vision--unable to  read regular print or see TV (just listens to it). Takes SCAT. Was told she was too young for mobile meals.    Pt reports has more distorted vision since the beginning however denies actual diplopia.    Says she takes her meds in bubble pack as prescribed multiple times per day.  Denies noncompliance.  Says also has L RTC tear, but doesn't want more surgery.  Normal pain "in her neck and L shoulder"- but "that's chronic".  Peeing OK Admits was  smelling cigarettes smoke for 1 week prior to coming to hospital with new stroke- had called Neurologist 1 week rpior to admission to ask what to do- never heard back, not to this day. Was concerned that meant something since wasn't NEAR cigarette smoke.   Patient transferred to CIR on 04/17/2023 .    Patient currently requires supervision with basic self-care skills secondary to decreased coordination, decreased visual motor skills, and decreased balance.  Prior to hospitalization, patient could complete BADLs with modified independent .  Patient will benefit from skilled intervention to increase independence with basic self-care skills prior to discharge home independently.  Anticipate patient will require intermittent supervision and follow up home health.  OT - End of Session Endurance Deficit: No OT Assessment Rehab Potential (ACUTE ONLY): Good OT Barriers to Discharge: Decreased caregiver support OT Barriers to Discharge Comments: pt will only have intermittent A from her brother OT Patient demonstrates impairments in the following area(s): Balance;Motor;Vision OT Basic ADL's Functional Problem(s): Grooming;Bathing;Dressing;Toileting;Eating OT Advanced ADL's Functional Problem(s): Simple Meal Preparation;Light Housekeeping OT Transfers Functional Problem(s): Toilet;Tub/Shower OT Additional Impairment(s): Fuctional Use of Upper Extremity OT Plan OT Intensity: Minimum of 1-2 x/day, 45 to 90 minutes OT Frequency: 5 out of 7 days OT Duration/Estimated Length of Stay: 10-12 days OT Treatment/Interventions: Balance/vestibular training;Discharge planning;Neuromuscular re-education;Psychosocial support;Patient/family education;Self Care/advanced ADL retraining;Therapeutic Activities;Therapeutic Exercise;UE/LE Strength taining/ROM;UE/LE Coordination activities;Visual/perceptual remediation/compensation OT Self Feeding Anticipated Outcome(s): independent OT Basic Self-Care Anticipated Outcome(s):  Mod I OT Toileting Anticipated Outcome(s): Mod I OT Bathroom Transfers Anticipated Outcome(s): Mod I OT Recommendation Patient destination: Home Follow Up Recommendations: Home health OT Equipment Recommended: None recommended by OT   OT Evaluation Precautions/Restrictions  Precautions Precautions: Fall Restrictions Weight Bearing Restrictions: No   Pain Pain Assessment Pain Scale: 0-10 Pain Score: 0-No pain Home Living/Prior Functioning Home Living Family/patient expects to be discharged to:: Private residence Living Arrangements: Alone Available Help at Discharge: Family, Available PRN/intermittently Home Access: Level entry Home Layout: One level Bathroom Shower/Tub: Tub/shower unit, Other (comment) (has a tub bench in apt)  Lives With: Alone Prior Function Level of Independence: Independent with basic ADLs, Independent with homemaking with ambulation, Requires assistive device for independence Driving: No Vocation: On disability Vision Baseline Vision/History: 4 Cataracts;1 Wears glasses Ability to See in Adequate Light: 2 Moderately impaired Patient Visual Report: Diplopia;Blurring of vision (L eye vision is blurry and darker per pt) Vision Assessment?: Yes Eye Alignment: Impaired (comment) (pt's eye at rest is in slight adduction and inferior orientation) Ocular Range of Motion: Restricted on the left (pt has difficulty reaching full ROM to temporal side with L eye) Alignment/Gaze Preference: Within Defined Limits Tracking/Visual Pursuits: Left eye does not track laterally;Impaired - to be further tested in functional context;Other (comment) (L eye does track laterally but not for full ROM, decreased smoothness of L eye in all fields with "shaking" of pupil) Convergence: Impaired (comment) (L eye not able to converge at same time as R eye and pt has diplopia with convergence of 10  inches or closer to her nose) Visual Fields: Left visual field deficit (due to blurry  vision of L eye) Diplopia Assessment: Disappears with one eye closed;Present in near gaze;Present in primary gaze Perception  Perception: Within Functional Limits Praxis Praxis: WFL Cognition Cognition Overall Cognitive Status: Within Functional Limits for tasks assessed Arousal/Alertness: Awake/alert Orientation Level: Person;Place;Situation Person: Oriented Place: Oriented Situation: Oriented Memory: Appears intact Focused Attention: Appears intact Awareness: Appears intact Problem Solving: Appears intact Safety/Judgment: Appears intact Brief Interview for Mental Status (BIMS) Repetition of Three Words (First Attempt): 3 Temporal Orientation: Year: Correct Temporal Orientation: Month: Accurate within 5 days Temporal Orientation: Day: Correct Recall: "Sock": Yes, no cue required Recall: "Blue": Yes, no cue required Recall: "Bed": Yes, no cue required BIMS Summary Score: 15 Sensation Sensation Light Touch: Appears Intact Hot/Cold: Appears Intact Proprioception: Appears Intact Stereognosis: Not tested Coordination Gross Motor Movements are Fluid and Coordinated: No Fine Motor Movements are Fluid and Coordinated: No Coordination and Movement Description: pt had a CVA prior to admission that impacted R side FMC which is causing her to have difficulty with writing, opening containers Motor  Motor Motor: Hemiplegia  Trunk/Postural Assessment  Postural Control Postural Control: Deficits on evaluation (noted slight L lean in sitting when pt working on BUE AROM and or visual assessment tasks)  Balance Dynamic Sitting Balance Dynamic Sitting - Level of Assistance: 5: Stand by assistance Static Standing Balance Static Standing - Level of Assistance: 5: Stand by assistance Dynamic Standing Balance Dynamic Standing - Level of Assistance: 4: Min assist Extremity/Trunk Assessment RUE Assessment Active Range of Motion (AROM) Comments: WFL General Strength Comments: 4/5 LUE  Assessment Active Range of Motion (AROM) Comments: WFL General Strength Comments: 4-/5  Care Tool Care Tool Self Care Eating   Eating Assist Level: Set up assist    Oral Care    Oral Care Assist Level: Set up assist    Bathing   Body parts bathed by patient: Right arm;Left arm;Chest;Abdomen;Front perineal area;Buttocks;Right upper leg;Face;Left lower leg;Right lower leg;Left upper leg     Assist Level: Set up assist    Upper Body Dressing(including orthotics)   What is the patient wearing?: Bra;Pull over shirt   Assist Level: Set up assist    Lower Body Dressing (excluding footwear)   What is the patient wearing?: Underwear/pull up;Pants Assist for lower body dressing: Supervision/Verbal cueing    Putting on/Taking off footwear   What is the patient wearing?: Socks;Shoes Assist for footwear: Supervision/Verbal cueing       Care Tool Toileting Toileting activity   Assist for toileting: Supervision/Verbal cueing     Care Tool Bed Mobility Roll left and right activity   Roll left and right assist level: Independent    Sit to lying activity   Sit to lying assist level: Supervision/Verbal cueing    Lying to sitting on side of bed activity   Lying to sitting on side of bed assist level: the ability to move from lying on the back to sitting on the side of the bed with no back support.: Supervision/Verbal cueing     Care Tool Transfers Sit to stand transfer   Sit to stand assist level: Contact Guard/Touching assist    Chair/bed transfer   Chair/bed transfer assist level: Contact Guard/Touching assist     Toilet transfer   Assist Level: Contact Guard/Touching assist     Care Tool Cognition  Expression of Ideas and Wants Expression of Ideas and Wants: 4. Without difficulty (complex and basic) - expresses complex  messages without difficulty and with speech that is clear and easy to understand  Understanding Verbal and Non-Verbal Content Understanding Verbal and  Non-Verbal Content: 4. Understands (complex and basic) - clear comprehension without cues or repetitions   Memory/Recall Ability Memory/Recall Ability : That he or she is in a hospital/hospital unit;Current season;Staff names and faces;Location of own room   Refer to Care Plan for Long Term Goals  SHORT TERM GOAL WEEK 1 OT Short Term Goal 1 (Week 1): Pt will demonstrate improved R FMC to be able to open frozen food containers and place food items in microwave. OT Short Term Goal 2 (Week 1): Pt will be able to ambulated to bathroom with RW with supervision only. OT Short Term Goal 3 (Week 1): Pt will demonstrate her ability to do diplopia visual exercises on her own without cues.  Recommendations for other services: None    Skilled Therapeutic Intervention ADL ADL ADL Comments: close supervision with all tasks of self care, CGA with ambulation with RW    Pt seen for initial evaluation and ADL training with a focus on safe functional mobility as pt toileting, showered and dressed.  Spent quite a bit of the session assessing her vision and having pt practice oculomotor exercises to improve L eye motor control.  Pt has nasal occlusion glasses - added a small strip of occlusion for inferior field.   Reviewed role of OT, discussed POC and pt's goals, and ELOS. Pt resting in bed With all needs met and bed alarm set.     Discharge Criteria: Patient will be discharged from OT if patient refuses treatment 3 consecutive times without medical reason, if treatment goals not met, if there is a change in medical status, if patient makes no progress towards goals or if patient is discharged from hospital.  The above assessment, treatment plan, treatment alternatives and goals were discussed and mutually agreed upon: by patient  [facility] 04/18/2023, 12:57 PM

## 2023-04-19 ENCOUNTER — Encounter: Payer: 59 | Admitting: Occupational Therapy

## 2023-04-19 DIAGNOSIS — E1122 Type 2 diabetes mellitus with diabetic chronic kidney disease: Secondary | ICD-10-CM

## 2023-04-19 DIAGNOSIS — H532 Diplopia: Secondary | ICD-10-CM

## 2023-04-19 DIAGNOSIS — E1149 Type 2 diabetes mellitus with other diabetic neurological complication: Secondary | ICD-10-CM | POA: Diagnosis not present

## 2023-04-19 DIAGNOSIS — I63311 Cerebral infarction due to thrombosis of right middle cerebral artery: Secondary | ICD-10-CM | POA: Diagnosis not present

## 2023-04-19 NOTE — Progress Notes (Signed)
Occupational Therapy Session Note  Patient Details  Name: Christina Orozco MRN: [id number] Date of Birth: [birth year]  Today's Date: 04/19/2023 OT Individual Time: 8413-2440 OT Individual Time Calculation (min): 63 min    Short Term Goals: Week 1:  OT Short Term Goal 1 (Week 1): Pt will demonstrate improved R FMC to be able to open frozen food containers and place food items in microwave. OT Short Term Goal 2 (Week 1): Pt will be able to ambulated to bathroom with RW with supervision only. OT Short Term Goal 3 (Week 1): Pt will demonstrate her ability to do diplopia visual exercises on her own without cues.  Skilled Therapeutic Interventions/Progress Updates:     Pt received in w/c ready for therapy. Hand off from PT.   Focus of therapy session on L eye motor control exercises.      Therapeutic Activity/ Exercise: -pt sat on mat in gym and worked on reading card, she can see near print at 20/125 or 2.5 M print size -worked on pencil push ups for convergence -L eye tracking with figure 8s -brock string for convergence/ divergence - pt able to see strings split -pt interacted with dog during pet therapy  Assessed FMC with 9 hole peg test:  L hand 53 sec R hand 49 sec   Transfers: - close supervision  Balance: -close supervision as pt worked in standing on a corn hole toss activity   Pt resting in w/c with all needs met. Call light in reach.   Therapy Documentation Precautions:  Precautions Precautions: Fall Precaution Comments: mild R ataxia Restrictions Weight Bearing Restrictions: No    Vital Signs: Therapy Vitals Temp: 98.4 F (36.9 C) Temp Source: Oral Pulse Rate: 76 Resp: 18 BP: (!) 148/80 Patient Position (if appropriate): Sitting Oxygen Therapy SpO2: 100 % O2 Device: Room Air Pain: Pain Assessment Pain Scale: 0-10 Pain Score: 0-No pain ADL: ADL ADL Comments: close supervision with all tasks of self care, CGA with ambulation with  RW   Therapy/Group: Individual Therapy  [place] 04/19/2023, 8:58 AM

## 2023-04-19 NOTE — Progress Notes (Signed)
Occupational Therapy Session Note  Patient Details  Name: Christina Orozco MRN: [id number] Date of Birth: [birth year]  Today's Date: 04/19/2023 OT Individual Time: 4098-1191 OT Individual Time Calculation (min): 48 min    Short Term Goals: Week 1:  OT Short Term Goal 1 (Week 1): Pt will demonstrate improved R FMC to be able to open frozen food containers and place food items in microwave. OT Short Term Goal 2 (Week 1): Pt will be able to ambulated to bathroom with RW with supervision only. OT Short Term Goal 3 (Week 1): Pt will demonstrate her ability to do diplopia visual exercises on her own without cues.  Skilled Therapeutic Interventions/Progress Updates:  Pt greeted supine in bed, pt agreeable to OT intervention.    Pt received with occlusion glasses donned.   Transfers/bed mobility: supine>sit with use of bed rail with CGA. Pt completed ambulatory transfer into bathroom with Rw and CGA. Pt entered walkin shower with MIN HHA and use of grab bars.    ADLs:   UB dressing:pt donned OH shirt with set- up assist LB dressing: pt donned pants/brief with CGA Footwear: pt able to don slide in shoes with set- up assist.   Bathing: pt completed bathing seated/standing with close supervision. Pt did have near LOB when trying to use feet to retrieve soap that fell on floor, but otherwise pt demonstrated fair safety awareness using grab bars as needed. Pt using RUE appropriately during bathing tasks I.e using RUE to hold hand held shower head  Transfers: ADL transfers with Rw and CGA Toileting: pt completed 3/3 toileting tasks with CGA, however pt unable to void  Ended session with pt seated EOB with all needs within reach and bed alarm activated.                    Therapy Documentation Precautions:  Precautions Precautions: Fall Precaution Comments: mild R ataxia Restrictions Weight Bearing Restrictions: No  Pain: No pain reported during session   Therapy/Group: Individual  Therapy  Pollyann Glen [facility] 04/19/2023, 12:15 PM

## 2023-04-19 NOTE — Progress Notes (Signed)
PROGRESS NOTE   Subjective/Complaints:  Pt states that on the day of her CVA she smelled cigarette smoke and this happened prior to her previous stroke as well Pt states her GFR in July 2024 was ~18, on 10/31/22 it was 19 ROS Objective:   No results found. Recent Labs    04/18/23 0531  WBC 6.0  HGB 7.7*  HCT 23.9*  PLT 190   Recent Labs    04/18/23 0531  NA 138  K 3.9  CL 111  CO2 20*  GLUCOSE 136*  BUN 49*  CREATININE 3.00*  CALCIUM 9.0    Intake/Output Summary (Last 24 hours) at 04/19/2023 8469 Last data filed at 04/18/2023 1829 Gross per 24 hour  Intake 960 ml  Output --  Net 960 ml        Physical Exam: Vital Signs Blood pressure (!) 148/80, pulse 76, temperature 98.4 F (36.9 C), temperature source Oral, resp. rate 18, height 5\' 2"  (1.575 m), weight 60 kg, last menstrual period 05/22/2018, SpO2 100%.   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 No joint swelling   Assessment/Plan: 1. Functional deficits which require 3+ hours per day of interdisciplinary therapy in a comprehensive inpatient rehab setting. Physiatrist is providing  close team supervision and 24 hour management of active medical problems listed below. Physiatrist and rehab team continue to assess barriers to discharge/monitor patient progress toward functional and medical goals  Care Tool:  Bathing    Body parts bathed by patient: Right arm, Left arm, Chest, Abdomen, Front perineal area, Buttocks, Right upper leg, Face, Left lower leg, Right lower leg, Left upper leg         Bathing assist Assist Level: Set up assist     Upper Body Dressing/Undressing Upper body dressing   What is the patient wearing?: Bra, Pull over shirt    Upper body assist Assist Level: Set up assist    Lower Body Dressing/Undressing Lower body dressing      What is the patient wearing?: Underwear/pull up, Pants     Lower body assist Assist for lower body dressing: Supervision/Verbal cueing     Toileting Toileting    Toileting assist Assist for toileting: Supervision/Verbal cueing     Transfers Chair/bed transfer  Transfers assist     Chair/bed transfer assist level: Contact Guard/Touching assist     Locomotion Ambulation   Ambulation assist      Assist level: Minimal Assistance - Patient > 75% Assistive device: Walker-rolling  Max distance: 200 ft   Walk 10 feet activity   Assist     Assist level: Minimal Assistance - Patient > 75% Assistive device: No Device   Walk 50 feet activity   Assist    Assist level: Minimal Assistance - Patient > 75% Assistive device: Walker-rolling    Walk 150 feet activity   Assist    Assist level: Minimal Assistance - Patient > 75% Assistive device: Walker-rolling    Walk 10 feet on uneven surface  activity   Assist Walk 10 feet on uneven surfaces activity did not occur: Safety/medical concerns         Wheelchair     Assist Is the patient using a wheelchair?: No   Wheelchair activity did not occur: N/A         Wheelchair 50 feet with 2 turns activity    Assist     Wheelchair 50 feet with 2 turns activity did not occur: N/A       Wheelchair 150 feet activity     Assist  Wheelchair 150 feet activity did not occur: N/A       Blood pressure (!) 148/80, pulse 76, temperature 98.4 F (36.9 C), temperature source Oral, resp. rate 18, height 5\' 2"  (1.575 m), weight 60 kg, last menstrual period 05/22/2018, SpO2 100%.  Medical Problem List and Plan: 1. 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  Antithrombotics: -DVT/anticoagulation:  Pharmaceutical: Lovenox             -antiplatelet therapy: ASA/Brilinta.  3. Pain Management: Tylenol for pain prn. 4. Mood/Behavior/Sleep: Melatonin prn.             -antipsychotic agents: LCSW to follow for evaluation and support.  5. Neuropsych/cognition: This patient is capable of making decisions on her own behalf. 6. Skin/Wound Care: Routine pressure relief measures.  7. Fluids/Electrolytes/Nutrition: Monitor I/O. Monitor BMET  8. HTN: Monitor BP TID. On Coreg and amlodipine.              --Continues to be limited by dizziness--monitor for orthostatic changes.              -pt emphatic was using pill packs to do meds/bubble packs and says was compliant with meds? Vitals:   04/18/23 1954 04/19/23 0516  BP: (!) 142/82 (!) 148/80  Pulse: 79 76  Resp: 18 18  Temp: 98.2 F (36.8 C) 98.4 F (36.9 C)  SpO2: 100% 100%    9. T2DM w/retinopathy and neuropathy:  Hgb A1c- 6.1 and well controlled. -- Monitor BS ac/hs and use SSI for elevated BS.  --Was on monjuaro at home CBG (last 3)  Recent  Labs    04/17/23 2103 04/18/23 0620 04/18/23 1200  GLUCAP 130* 116* 100*   Cont semaglutide 0.5mg  [place] q wk 10. 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 Limits fluids to 64 ounces otherwise on  regular diet             --resume Bicarb for NAGMA.     Latest Ref Rng & Units 04/18/2023    5:31 AM 04/14/2023   11:35 AM 04/13/2023   10:30 PM  BMP  Glucose 70 - 99 mg/dL 213  086  578   BUN 6 - 20 mg/dL 49  32  34   Creatinine 0.44 - 1.00 mg/dL 4.69  6.29  5.28   Sodium 135 - 145 mmol/L 138  143  141   Potassium 3.5 - 5.1 mmol/L 3.9  3.5  3.7   Chloride 98 - 111 mmol/L 111  114  111   CO2 22 - 32 mmol/L 20  19  18    Calcium 8.9 - 10.3 mg/dL 9.0  9.7  9.4   Increased BUN and Creat will give IVF and recheck in am not on ACE-I or diuretics , if pt does not respond to diuretics may need nephro input  11. OSA: Compliant with CPAP 12. H/o Depression: On Prozac--motivated and remains positive.  13. Constipation: will add miralax. If doesn't go by this evening, will add Sorbitol- scared to push  14. Anemia of chronic disease: Monitor H/H with serial checks, Hgb dropped  15. Leucocytosis: Monitor for fevers and other signs of infection.              --recheck WBC in am.      Latest Ref Rng & Units 04/18/2023    5:31 AM 04/14/2023   11:35 AM 04/13/2023   10:30 PM  CBC  WBC 4.0 - 10.5 K/uL 6.0  10.8  10.3   Hemoglobin 12.0 - 15.0 g/dL 7.7  9.4  9.3   Hematocrit 36.0 - 46.0 % 23.9  28.8  28.8   Platelets 150 - 400 K/uL 190  290  273    ? GI blood loss vs chronic kidney , check stool guaic  16.  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

## 2023-04-19 NOTE — Plan of Care (Signed)
  Problem: RH Balance Goal: LTG Patient will maintain dynamic standing balance (PT) Description: LTG:  Patient will maintain dynamic standing balance with assistance during mobility activities (PT) Flowsheets (Taken 04/18/2023 1716) LTG: Pt will maintain dynamic standing balance during mobility activities with:: Independent with assistive device    Problem: Sit to Stand Goal: LTG:  Patient will perform sit to stand with assistance level (PT) Description: LTG:  Patient will perform sit to stand with assistance level (PT) Flowsheets (Taken 04/18/2023 1716) LTG: PT will perform sit to stand in preparation for functional mobility with assistance level: Independent with assistive device   Problem: RH Car Transfers Goal: LTG Patient will perform car transfers with assist (PT) Description: LTG: Patient will perform car transfers with assistance (PT). Flowsheets (Taken 04/18/2023 1716) LTG: Pt will perform car transfers with assist:: Supervision/Verbal cueing   Problem: RH Furniture Transfers Goal: LTG Patient will perform furniture transfers w/assist (OT/PT) Description: LTG: Patient will perform furniture transfers  with assistance (OT/PT). Flowsheets (Taken 04/18/2023 1716) LTG: Pt will perform furniture transfers with assist:: Independent with assistive device    Problem: RH Ambulation Goal: LTG Patient will ambulate in home environment (PT) Description: LTG: Patient will ambulate in home environment, # of feet with assistance (PT). Flowsheets (Taken 04/18/2023 1716) LTG: Pt will ambulate in home environ  assist needed:: Independent with assistive device LTG: Ambulation distance in home environment: up to 50 ft using LRAD Goal: LTG Patient will ambulate in community environment (PT) Description: LTG: Patient will ambulate in community environment, # of feet with assistance (PT). Flowsheets (Taken 04/18/2023 1716) LTG: Pt will ambulate in community environ  assist needed:: Supervision/Verbal  cueing LTG: Ambulation distance in community environment: >400 ft using LRAD   Problem: RH Stairs Goal: LTG Patient will ambulate up and down stairs w/assist (PT) Description: LTG: Patient will ambulate up and down # of stairs with assistance (PT) Flowsheets (Taken 04/18/2023 1716) LTG: Pt will ambulate up/down stairs assist needed:: Supervision/Verbal cueing LTG: Pt will  ambulate up and down number of stairs: at least 4 steps using HR setup as per community environment in order to utilize public transport and mobilize in community   Problem: RH Bed to Chair Transfers Goal: LTG Patient will perform bed/chair transfers w/assist (PT) Description: LTG: Patient will perform bed to chair transfers with assistance (PT). Flowsheets (Taken 04/18/2023 1716) LTG: Pt will perform Bed to Chair Transfers with assistance level: Independent with assistive device

## 2023-04-19 NOTE — Evaluation (Addendum)
Physical Therapy Assessment and Plan  Patient Details  Name: Christina Orozco MRN: [id number] Date of Birth: 01-17-1971  PT Diagnosis: Difficulty walking, Dizziness and giddiness, Hemiparesis non-dominant, and Muscle weakness Rehab Potential: Good ELOS: 10-12 days   Today's Date: 04/18/2023 PT Individual Time: 0852-1001 PT Individual Time Calculation (min): 69 min    Hospital Problem: Principal Problem:   Stroke (cerebrum) (HCC)   Past Medical History:  Past Medical History:  Diagnosis Date   Anemia    severe   Anxiety    Asthma    CKD (chronic kidney disease)    stage IV   DDD (degenerative disc disease), cervical    Depression    Diabetes mellitus (HCC)    Dislocated shoulder    right   GERD (gastroesophageal reflux disease)    Headache    migraines (about once a month)   History of kidney stones    Hypertension    Neuropathy    Peripheral vascular disease (HCC)    Pneumonia    Stroke [facility])    Past Surgical History:  Past Surgical History:  Procedure Laterality Date   ADENOIDECTOMY     APPENDECTOMY     CERVICAL SPINE SURGERY  06/2012   C5-C6   EYE SURGERY     left eye surgery    FOOT SURGERY     KENALOG INJECTION Left 03/10/2021   Procedure: Intravitreal Avastin Injection;  Surgeon: Stephannie Li, MD;  Location: [facility] OR;  Service: Ophthalmology;  Laterality: Left;   LOOP RECORDER INSERTION N/A 08/27/2017   Procedure: LOOP RECORDER INSERTION;  Surgeon: Thurmon Fair, MD;  Location: [facility] INVASIVE CV LAB;  Service: Cardiovascular;  Laterality: N/A;   MEMBRANE PEEL Left 03/10/2021   Procedure: MEMBRANE PEEL;  Surgeon: Stephannie Li, MD;  Location: [facility] OR;  Service: Ophthalmology;  Laterality: Left;   PARS PLANA VITRECTOMY Left 03/10/2021   Procedure: TWENTY-FIVE GAUGE PARS PLANA VITRECTOMY;  Surgeon: Stephannie Li, MD;  Location: [facility] OR;  Service: Ophthalmology;  Laterality: Left;   PHOTOCOAGULATION WITH LASER Left 03/10/2021   Procedure: PHOTOCOAGULATION WITH  LASER;  Surgeon: Stephannie Li, MD;  Location: [facility] OR;  Service: Ophthalmology;  Laterality: Left;   SHOULDER SURGERY     TEE WITHOUT CARDIOVERSION N/A 08/27/2017   Procedure: TRANSESOPHAGEAL ECHOCARDIOGRAM (TEE);  Surgeon: Thurmon Fair, MD;  Location: [facility] ENDOSCOPY;  Service: Cardiovascular;  Laterality: N/A;   TONSILLECTOMY      Assessment & Plan Clinical Impression: Patient is a 52 y.o. R handed (cannot use R hand anymore)  female with history of HTN, T2DM with retinopathy, neuropathy, MDD, CKD, OSA, anxiety/depression, multiple strokes last 08/30/22 with  right sided weakness and dysarthria and ongoing outpatient therapy. She was admitted on 04/13/23 after syncopal episode, intractable N/V and double vision left eye. MRI brain done revealing acute 7 mm subcortical infarct in right frontoparietal region and at right splenium with severe chronic small vessel disease and multiple chronic hemorrhages s/o chronic poorly controlled HTN. Case was discussed with  neurology who recommended continuing ASA, Brilinta and Atorvastatin as on maximal therapy. GI symptoms have resolved but she continues to be limited by decrease motor control RUE and diplopia on left. Therapy has been working with patient who is limited by dizziness and  requires CGA to min assist with mobility and ADLs. She was independent PTA and CIR recommended due to functional decline.    She lives alone and brother checks in weekly. Has frozen dinners due to poor vision--unable to read regular print or see TV (  just listens to it). Takes SCAT. Was told she was too young for mobile meals.    Pt reports has more distorted vision since the beginning however denies actual diplopia.    LBM Friday- afraid to push.   Says she takes her meds in bubble pack as prescribed multiple times per day.  Denies noncompliance.  Says also has L RTC tear, but doesn't want more surgery.  Normal pain "in her neck and L shoulder"- but "that's chronic".  Peeing  OK Admits was smelling cigarettes smoke for 1 week prior to coming to hospital with new stroke- had called Neurologist 1 week rpior to admission to ask what to do- never heard back, not to this day. Was concerned that meant something since wasn't NEAR cigarette smoke. Patient transferred to CIR on 04/17/2023 .   Patient currently requires min assist with mobility secondary to muscle weakness, sleep apnea, impaired timing and sequencing, unbalanced muscle activation, and decreased coordination, decreased visual acuity and decreased visual perceptual skills, and decreased standing balance and decreased balance strategies.  Prior to hospitalization, patient was modified independent  with mobility and lived with Alone in a Apartment home.  Home access is  Level entry.  Patient will benefit from skilled PT intervention to maximize safe functional mobility, minimize fall risk, and decrease caregiver burden for planned discharge home with intermittent assist.  Anticipate patient will benefit from follow up OP at discharge.  PT - End of Session Activity Tolerance: Tolerates 30+ min activity without fatigue Endurance Deficit: No PT Assessment Rehab Potential (ACUTE/IP ONLY): Good PT Barriers to Discharge: Decreased caregiver support;Insurance for SNF coverage;Lack of/limited family support;Nutrition means PT Patient demonstrates impairments in the following area(s): Balance;Endurance;Motor;Nutrition;Safety PT Transfers Functional Problem(s): Bed to Chair;Car;Furniture PT Locomotion Functional Problem(s): Ambulation;Stairs PT Plan PT Intensity: Minimum of 1-2 x/day ,45 to 90 minutes PT Frequency: 5 out of 7 days PT Duration Estimated Length of Stay: 10-12 days PT Treatment/Interventions: Ambulation/gait training;Cognitive remediation/compensation;Discharge planning;DME/adaptive equipment instruction;Functional mobility training;Pain management;Psychosocial support;Splinting/orthotics;Therapeutic  Activities;UE/LE Strength taining/ROM;Visual/perceptual remediation/compensation;Wheelchair propulsion/positioning;UE/LE Coordination activities;Therapeutic Exercise;Stair training;Skin care/wound management;Patient/family education;Neuromuscular re-education;Functional electrical stimulation;Disease management/prevention;Community reintegration;Balance/vestibular training PT Transfers Anticipated Outcome(s): Mod I PT Locomotion Anticipated Outcome(s): Mod I/ supervision PT Recommendation Follow Up Recommendations: 24 hour supervision/assistance;Outpatient PT Patient destination: Home Equipment Recommended: To be determined   PT Evaluation Precautions/Restrictions Precautions Precautions: Fall Precaution Comments: mild R ataxia Restrictions Weight Bearing Restrictions: No General   Vital SignsOxygen Therapy FiO2 (%): 21 % Pain Pain Assessment Pain Scale: 0-10 Pain Score: 0-No pain Pain Interference Pain Interference Pain Effect on Sleep: 0. Does not apply - I have not had any pain or hurting in the past 5 days Pain Interference with Therapy Activities: 0. Does not apply - I have not received rehabilitationtherapy in the past 5 days Pain Interference with Day-to-Day Activities: 1. Rarely or not at all Home Living/Prior Functioning Home Living Available Help at Discharge: Family;Available PRN/intermittently Type of Home: Apartment Home Access: Level entry Home Layout: One level Bathroom Shower/Tub: Tub/shower unit;Other (comment) (has a tub bench in apt)  Lives With: Alone Prior Function Level of Independence: Independent with basic ADLs;Independent with homemaking with ambulation;Requires assistive device for independence;Independent with gait;Independent with transfers  Able to Take Stairs?: Yes Driving: No Vocation: On disability Vision/Perception  Vision - History Ability to See in Adequate Light: 2 Moderately impaired Vision - Assessment Eye Alignment: Impaired  (comment) (eye in slight adduction and inferior orientation) Alignment/Gaze Preference: Within Defined Limits Tracking/Visual Pursuits: Left eye does not track laterally;Impaired - to  be further tested in functional context;Other (comment) Convergence: Impaired (comment) (L eye not able to converge at same time as R eye and pt has diplopia with convergence of 10 inches or closer to her nose) Diplopia Assessment: Disappears with one eye closed;Present in primary gaze;Present in near gaze Perception Perception: Within Functional Limits Praxis Praxis: WFL  Cognition Overall Cognitive Status: Within Functional Limits for tasks assessed Arousal/Alertness: Awake/alert Focused Attention: Appears intact Memory: Appears intact Awareness: Appears intact Problem Solving: Appears intact Safety/Judgment: Appears intact Sensation Sensation Light Touch: Appears Intact Stereognosis: Not tested Coordination Gross Motor Movements are Fluid and Coordinated: No Fine Motor Movements are Fluid and Coordinated: No Coordination and Movement Description: 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 which is causing her to have difficulty with writing, opening containers Heel Shin Test: [facility] Motor  Motor Motor: Ataxia;Other (comment) Motor - Skilled Clinical Observations: hemipareisis with mild ataxia/ dyscoordination   Trunk/Postural Assessment  Cervical Assessment Cervical Assessment: Within Functional Limits Thoracic Assessment Thoracic Assessment: Within Functional Limits Lumbar Assessment Lumbar Assessment: Within Functional Limits Postural Control Postural Control: Within Functional Limits  Balance Dynamic Sitting Balance Dynamic Sitting - Level of Assistance: 5: Stand by assistance Static Standing Balance Static Standing - Level of Assistance: 5: Stand by assistance Dynamic Standing Balance Dynamic Standing - Level of Assistance: 4: Min assist Extremity Assessment      RLE  Assessment RLE Assessment: Within Functional Limits General Strength Comments: grossly 4/ 5 prox to dist LLE Assessment LLE Assessment: Within Functional Limits General Strength Comments: grossly 4/ 5 prox to dist  Care Tool Care Tool Bed Mobility Roll left and right activity   Roll left and right assist level: Independent    Sit to lying activity   Sit to lying assist level: Independent    Lying to sitting on side of bed activity   Lying to sitting on side of bed assist level: the ability to move from lying on the back to sitting on the side of the bed with no back support.: Supervision/Verbal cueing     Care Tool Transfers Sit to stand transfer   Sit to stand assist level: Contact Guard/Touching assist    Chair/bed transfer   Chair/bed transfer assist level: Contact Guard/Touching assist    Car transfer Car transfer activity did not occur: Safety/medical concerns        Care Tool Locomotion Ambulation   Assist level: Minimal Assistance - Patient > 75% Assistive device: Walker-rolling Max distance: 200 ft  Walk 10 feet activity   Assist level: Minimal Assistance - Patient > 75% Assistive device: No Device   Walk 50 feet with 2 turns activity   Assist level: Minimal Assistance - Patient > 75% Assistive device: Walker-rolling  Walk 150 feet activity   Assist level: Minimal Assistance - Patient > 75% Assistive device: Walker-rolling  Walk 10 feet on uneven surfaces activity Walk 10 feet on uneven surfaces activity did not occur: Safety/medical concerns      Stairs   Assist level: Contact Guard/Touching assist Stairs assistive device: 2 hand rails Max number of stairs: 4  Walk up/down 1 step activity   Walk up/down 1 step (curb) assist level: Contact Guard/Touching assist Walk up/down 1 step or curb assistive device: 2 hand rails  Walk up/down 4 steps activity   Walk up/down 4 steps assist level: Contact Guard/Touching assist Walk up/down 4 steps assistive  device: 2 hand rails  Walk up/down 12 steps activity Walk up/down 12 steps activity did not  occur: Safety/medical concerns      Pick up small objects from floor   Pick up small object from the floor assist level: Contact Guard/Touching assist Pick up small object from the floor assistive device: RW  Wheelchair Is the patient using a wheelchair?: No   Wheelchair activity did not occur: N/A      Wheel 50 feet with 2 turns activity Wheelchair 50 feet with 2 turns activity did not occur: N/A    Wheel 150 feet activity Wheelchair 150 feet activity did not occur: N/A      Refer to Care Plan for Long Term Goals  SHORT TERM GOAL WEEK 1 PT Short Term Goal 1 (Week 1): Pt will perform standing transfers with overall supervision. PT Short Term Goal 2 (Week 1): Pt will ambulate at least 200 ft with rollator and supervision. PT Short Term Goal 3 (Week 1): Pt will demonstrate MCID improvement in outcome measure. PT Short Term Goal 4 (Week 1): Pt will demonstrate improvement in maintaining path with minimal deviation during ambulation using rollator.  Recommendations for other services: Adult nurse group, Stress management, and Outing/community reintegration  Skilled Therapeutic Intervention Mobility Bed Mobility Bed Mobility: Rolling Right;Rolling Left;Supine to Sit;Sit to Supine Rolling Right: Independent Rolling Left: Independent Supine to Sit: Supervision/Verbal cueing Sit to Supine: Independent with assistive device Transfers Transfers: Sit to Stand;Stand to Sit;Stand Pivot Transfers Sit to Stand: Supervision/Verbal cueing;Contact Guard/Touching assist Stand to Sit: Supervision/Verbal cueing;Contact Guard/Touching assist Stand Pivot Transfers: Contact Guard/Touching assist Transfer (Assistive device): Rolling walker Locomotion  Gait Ambulation: Yes Gait Assistance: Contact Guard/Touching assist;Minimal Assistance - Patient > 75% Gait Distance (Feet): 200  Feet Assistive device: Rolling walker Gait Assistance Details: Tactile cues for placement;Verbal cues for precautions/safety;Verbal cues for safe use of DME/AE Gait Gait: Yes Gait Pattern: Decreased step length - right;Decreased step length - left Gait velocity: decreased Stairs / Additional Locomotion Stairs: Yes Stairs Assistance: Contact Guard/Touching assist Stair Management Technique: Two rails Number of Stairs: 4 Height of Stairs: 6 Wheelchair Mobility Wheelchair Mobility: No  Skilled Intervention: PT Evaluation completed; see above for results. PT educated patient in roles of PT vs OT, PT POC, rehab potential, rehab goals, and discharge recommendations along with recommendation for follow-up rehabilitation services. Individual treatment initiated:  Patient supine in bed upon PT arrival to room. Patient alert and agreeable to PT session. No pain complaint during session.  Therapeutic Activity: Bed Mobility: Patient performed supine to/from sit with supervision/ Mod I. Provided verbal cues for effort. While seated EOB, pt performs dressing tasks with setup.  Transfers: Patient performed sit <> stand  transfers with close supervision and stand pivot transfers with CGA. Provided vc/tc for balance and reducing use of BLE knee extension against bed  for steadying and attaining balance with no AD.  Gait Training:  Patient ambulated 200 feet x2 using RW with CGA/ MinA for walker mgmt d/t reduced vision. Pt walks into objects on R side while ambulating hallways (despite complaint of L eye visual difficulties). Ambulated with decreased stride length and hesitancy attributed to decreased visual acuity. Provided vc/tc for proximity to obstacles, mgmt of RW as pt tending to push LUE ahead of RUE.   Pt hesitant to participate in stair negotiation as she states that last time she performed stairs, she lost vision in her R eye the next day. Educated pt that stair performance is not directly  related to temporary vision loss but more likely related to changes in cardiorespiratory systems such as increased  HR or BP. Pt has already ambulated longer distance than she has performed in >1 wk and currently has no change in vision but has slight increase in HR and most likely small increase BP following activity.   Reluctantly participates and is able to complete with overall CGA to ascend/ descend four 6" steps using step to progression leading with RLE to ascend and LLE to descend.   On return to room, pt provided with additional sterilized water to use in CPAP. Requires MinA to fill tank with minor spillage. Dons and starts CPAP with Mod I.   Patient supine in bed at end of session with brakes locked, bed alarm set, and all needs within reach. CPAP donned.    Discharge Criteria: Patient will be discharged from PT if patient refuses treatment 3 consecutive times without medical reason, if treatment goals not met, if there is a change in medical status, if patient makes no progress towards goals or if patient is discharged from hospital.  The above assessment, treatment plan, treatment alternatives and goals were discussed and mutually agreed upon: by patient  Loel Dubonnet PT, DPT, CSRS 04/19/2023, 1:14 AM

## 2023-04-19 NOTE — Progress Notes (Signed)
Physical Therapy Session Note  Patient Details  Name: [name] MRN: [id number] Date of Birth: 10/17/1970  Today's Date: 04/19/2023 PT Individual Time: 1401-1441 PT Individual Time Calculation (min): 40 min   Short Term Goals: Week 1:  PT Short Term Goal 1 (Week 1): Pt will perform standing transfers with overall supervision. PT Short Term Goal 2 (Week 1): Pt will ambulate at least 200 ft with rollator and supervision. PT Short Term Goal 3 (Week 1): Pt will demonstrate MCID improvement in outcome measure. PT Short Term Goal 4 (Week 1): Pt will demonstrate improvement in maintaining path with minimal deviation during ambulation using rollator.  Skilled Therapeutic Interventions/Progress Updates:      Pt seated in WC upon arrival. Pt agreeable to therapy. Pt denies any pain.   Pt ambulated from room to main gym, and main gym to room with Rollator and CGA/supervision, verbal cues provided for obstacle negotiation.   Pt ambulated with rollator throughout obstacle course created with cones with rollator and CGA/supervision, verbal cues provided for head turn and scanning enviornment for obstacle negotiation, pt demos improved technique with repetition.   Pt ambulated with rollator throughout main gym to locate and pick up numbered dots 1-9 in numerical order-verbal cues provided for scanning to R and L of enviornment, positioning wheelchair to side of object and locking brakes of rollator prior to reaching to pick them up.  Pt performed ambulatory transfer to mat table throughout session with supervision, verbal cues provided for safety with rollator with emphasis on maintaining stance inside frame versus one LE in frame and one LE to the L of frame.   Pt seated in [facility] with all needs within reach and seatbelt alarm on       Therapy Documentation Precautions:  Precautions Precautions: Fall Precaution Comments: mild R ataxia Restrictions Weight Bearing Restrictions:  No  Therapy/Group: Individual Therapy  [facility] Ambrose Finland, Aneth, DPT  04/19/2023, 7:45 AM

## 2023-04-19 NOTE — Progress Notes (Signed)
Physical Therapy Session Note  Patient Details  Name: Christina Orozco MRN: [id number] Date of Birth: 1971/03/20  Today's Date: 04/19/2023 PT Individual Time: 1008-1105 PT Individual Time Calculation (min): 57 min   Short Term Goals: Week 1:  PT Short Term Goal 1 (Week 1): Pt will perform standing transfers with overall supervision. PT Short Term Goal 2 (Week 1): Pt will ambulate at least 200 ft with rollator and supervision. PT Short Term Goal 3 (Week 1): Pt will demonstrate MCID improvement in outcome measure. PT Short Term Goal 4 (Week 1): Pt will demonstrate improvement in maintaining path with minimal deviation during ambulation using rollator.  Skilled Therapeutic Interventions/Progress Updates: Patient sitting EOB on entrance to room. Patient alert and agreeable to PT session.   Patient reported no pain at beginning of PT session. PTA asked pt what pt's personal goals were for PT up to d/c. Pt reported that she would like to require as little assistance as possible, walk around apartment, and ambulate to mailbox (rough estimate per pt stated it being .25 miles, give or take). Pt performed sit to stand pivot transfer with no AD with CGA/close supervision. Pt initially performed stand pivot to the L in room from EOB to [facility] and did not turn head to the L to locate WC armrest, but instead used L UE to find armrest, occasionally missing target. Pt cued to restart transfer with increasing rotation to the L side to increase visual field and to locate arm rests visually. Pt then performed with close supervision and no issue. Pt transported to main gym in [facility] for energy conservation. PTA adjusted rollator for pt to use while staying here (pt has one at home) when in main gym. Pt then stood to rollator from [facility] with supervision (pt demos safe rollator management by ensuring locks are engaged prior to standing). Pt required VC to push with one UE off armrest vs B UE on rollator grip. Pt ambulated from main  gym to N tower and back for a total of 1,055' in rollator with close supervision throughout (mostly) with CGA towards end for safety as pt presented with increased fatigue and  B LE weakness due to pt not ambulating long distances in a while. Pt with 3 seated rest breaks. Pt required VC to increase step length B/step clearance as pt presented with increased sliding of L foot. Pt able to maintain throughout and required min cues towards end to maintain per pt presentation of fatigue. Pt also educated to place rollator along sturdy wall to increase stability when sitting for safety. 1st bout was 425', 2nd bout was 410', and 3rd/final bout being 220' until requiring minA to sit to rollator due to fatigue. Pt also presented with B eye blurriness when turning to the R during 2nd bout in N tower that lasted very briefly (pt reported 1 second). Pt with no other symptoms and rehabilitation team made aware. Pt back in WC via stand pivot and minA for safety and handed off to OT for next session in WC.      Therapy Documentation Precautions:  Precautions Precautions: Fall Precaution Comments: mild R ataxia Restrictions Weight Bearing Restrictions: No  Therapy/Group: Individual Therapy  Dailan Pfalzgraf PTA 04/19/2023, 12:28 PM

## 2023-04-20 DIAGNOSIS — H532 Diplopia: Secondary | ICD-10-CM | POA: Diagnosis not present

## 2023-04-20 DIAGNOSIS — I63311 Cerebral infarction due to thrombosis of right middle cerebral artery: Secondary | ICD-10-CM | POA: Diagnosis not present

## 2023-04-20 DIAGNOSIS — E1122 Type 2 diabetes mellitus with diabetic chronic kidney disease: Secondary | ICD-10-CM | POA: Diagnosis not present

## 2023-04-20 DIAGNOSIS — E1149 Type 2 diabetes mellitus with other diabetic neurological complication: Secondary | ICD-10-CM | POA: Diagnosis not present

## 2023-04-20 LAB — BASIC METABOLIC PANEL
Anion gap: 4 — ABNORMAL LOW (ref 5–15)
BUN: 56 mg/dL — ABNORMAL HIGH (ref 6–20)
CO2: 21 mmol/L — ABNORMAL LOW (ref 22–32)
Calcium: 8.8 mg/dL — ABNORMAL LOW (ref 8.9–10.3)
Chloride: 114 mmol/L — ABNORMAL HIGH (ref 98–111)
Creatinine, Ser: 3.45 mg/dL — ABNORMAL HIGH (ref 0.44–1.00)
GFR, Estimated: 16 mL/min — ABNORMAL LOW (ref >60)
Glucose, Bld: 112 mg/dL — ABNORMAL HIGH (ref 70–99)
Potassium: 3.7 mmol/L (ref 3.5–5.1)
Sodium: 139 mmol/L (ref 135–145)

## 2023-04-20 NOTE — Progress Notes (Addendum)
Assisted to the bathroom. Sleeping throughout the majority of the night. Denies any symptoms at this time. Not wanting the light on in room but denies having a headache at this time. Denies any issues with nausea or GI issues. No BM through the night. Denies any sensation of lightheadedness. Does endorse mood changes at times. Patient placing herself on the CPAP while she is awake and wearing it this morning.

## 2023-04-20 NOTE — Progress Notes (Signed)
PROGRESS NOTE   Subjective/Complaints:  Seen in physical therapy.  Discussed that her prior history of smelling's smoke at stroke onset may have been onset seizure related.  If she experiences this in the hospital would want to get an EEG. Discussed with patient that if this happens when she is back home to call 911 rather than her neurologist office ROS Objective:   No results found. Recent Labs    04/18/23 0531  WBC 6.0  HGB 7.7*  HCT 23.9*  PLT 190   Recent Labs    04/18/23 0531 04/20/23 0504  NA 138 139  K 3.9 3.7  CL 111 114*  CO2 20* 21*  GLUCOSE 136* 112*  BUN 49* 56*  CREATININE 3.00* 3.45*  CALCIUM 9.0 8.8*    Intake/Output Summary (Last 24 hours) at 04/20/2023 0838 Last data filed at 04/20/2023 0805 Gross per 24 hour  Intake 952 ml  Output --  Net 952 ml        Physical Exam: Vital Signs Blood pressure (!) 155/80, pulse 78, temperature 98.2 F (36.8 C), resp. rate 20, height 5\' 2"  (1.575 m), weight 61 kg, last menstrual period 05/22/2018, SpO2 100%.   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 No joint  swelling   Assessment/Plan: 1. Functional deficits which require 3+ hours per day of interdisciplinary therapy in a comprehensive inpatient rehab setting. Physiatrist is providing close team supervision and 24 hour management of active medical problems listed below. Physiatrist and rehab team continue to assess barriers to discharge/monitor patient progress toward functional and medical goals  Care Tool:  Bathing    Body parts bathed by patient: Right arm, Left arm, Chest, Abdomen, Front perineal area, Buttocks, Right upper leg, Face, Left lower leg, Right lower leg, Left upper leg         Bathing assist Assist Level: Set up assist     Upper Body Dressing/Undressing Upper body dressing   What is the patient wearing?: Bra, Pull over shirt    Upper body assist Assist Level: Set up assist    Lower Body Dressing/Undressing Lower body dressing      What is the patient wearing?: Underwear/pull up, Pants     Lower body assist Assist for lower body dressing: Supervision/Verbal cueing     Toileting Toileting    Toileting assist Assist for toileting: Supervision/Verbal cueing     Transfers Chair/bed transfer  Transfers assist     Chair/bed transfer assist level: Contact Guard/Touching assist  Locomotion Ambulation   Ambulation assist      Assist level: Minimal Assistance - Patient > 75% Assistive device: Walker-rolling Max distance: 200 ft   Walk 10 feet activity   Assist     Assist level: Minimal Assistance - Patient > 75% Assistive device: No Device   Walk 50 feet activity   Assist    Assist level: Minimal Assistance - Patient > 75% Assistive device: Walker-rolling    Walk 150 feet activity   Assist    Assist level: Minimal Assistance - Patient > 75% Assistive device: Walker-rolling    Walk 10 feet on uneven surface  activity   Assist Walk 10 feet on uneven surfaces activity did not occur: Safety/medical concerns          Wheelchair     Assist Is the patient using a wheelchair?: No   Wheelchair activity did not occur: N/A         Wheelchair 50 feet with 2 turns activity    Assist    Wheelchair 50 feet with 2 turns activity did not occur: N/A       Wheelchair 150 feet activity     Assist  Wheelchair 150 feet activity did not occur: N/A       Blood pressure (!) 155/80, pulse 78, temperature 98.2 F (36.8 C), resp. rate 20, height 5\' 2"  (1.575 m), weight 61 kg, last menstrual period 05/22/2018, SpO2 100%.  Medical Problem List and Plan: 1. 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  Antithrombotics: -DVT/anticoagulation:  Pharmaceutical: Lovenox             -antiplatelet therapy: ASA/Brilinta.  3. Pain Management: Tylenol for pain prn. 4. Mood/Behavior/Sleep: Melatonin prn.             -antipsychotic agents: LCSW to follow for evaluation and support.  5. Neuropsych/cognition: This patient is capable of making decisions on her own behalf. 6. Skin/Wound Care: Routine pressure relief measures.  7. Fluids/Electrolytes/Nutrition: Monitor I/O. Monitor BMET  8. HTN: Monitor BP TID. On Coreg and amlodipine.              --Continues to be limited by dizziness--monitor for orthostatic changes.              -pt emphatic was using pill packs to do meds/bubble packs and says was compliant with meds? Vitals:   04/19/23 2024 04/20/23 0409  BP: 134/69 (!) 155/80  Pulse: 75 78  Resp: 17 20  Temp: 98 F (36.7 C) 98.2 F (36.8 C)  SpO2: 100% 100%  Elevated this morning  will continue to monitor 9. T2DM w/retinopathy and neuropathy:  Hgb A1c- 6.1 and well controlled. -- Monitor BS ac/hs and use SSI for elevated BS.  --Was on monjuaro at home CBG (last 3)  Recent Labs    04/17/23 2103 04/18/23 0620 04/18/23 1200  GLUCAP 130* 116*  100*   Cont semaglutide 0.5mg  Ramos q wk, excellent control 10. 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 Limits fluids to 64 ounces otherwise on regular diet             --resume Bicarb for NAGMA.     Latest Ref Rng & Units 04/20/2023    5:04 AM 04/18/2023    5:31 AM 04/14/2023   11:35 AM  BMP  Glucose 70 - 99 mg/dL 914  782  956   BUN 6 - 20 mg/dL 56  49  32   Creatinine 0.44 - 1.00 mg/dL 2.13  0.86  5.78   Sodium 135 - 145 mmol/L 139  138  143   Potassium 3.5 - 5.1 mmol/L 3.7  3.9  3.5   Chloride 98 - 111 mmol/L 114  111  114   CO2 22 - 32 mmol/L 21  20  19    Calcium 8.9 - 10.3 mg/dL 8.8  9.0  9.7   Increased BUN and Creat will give IVF and recheck in am not on ACE-I or diuretics , will ask for nephro input  11. OSA: Compliant with CPAP 12. H/o Depression: On Prozac--motivated and remains positive.  13. Constipation: will add miralax. If doesn't go by this evening, will add Sorbitol- scared to push  14. Anemia of chronic disease: Monitor H/H with serial checks, Hgb dropped  15. Leucocytosis: Monitor for fevers and other signs of infection.              --recheck WBC in am.      Latest Ref Rng & Units 04/18/2023    5:31 AM 04/14/2023   11:35 AM 04/13/2023   10:30 PM  CBC  WBC 4.0 - 10.5 K/uL 6.0  10.8  10.3   Hemoglobin 12.0 - 15.0 g/dL 7.7  9.4  9.3   Hematocrit 36.0 - 46.0 % 23.9  28.8  28.8   Platelets 150 - 400 K/uL 190  290  273    ? GI blood loss vs chronic kidney , check stool guaic  16.  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

## 2023-04-20 NOTE — Progress Notes (Signed)
Occupational Therapy Session Note  Patient Details  Name: Christina Orozco MRN: [id number] Date of Birth: [birth year]  Today's Date: 04/20/2023 OT Individual Time: 1045-1200 OT Individual Time Calculation (min): 75 min    Short Term Goals: Week 1:  OT Short Term Goal 1 (Week 1): Pt will demonstrate improved R FMC to be able to open frozen food containers and place food items in microwave. OT Short Term Goal 2 (Week 1): Pt will be able to ambulated to bathroom with RW with supervision only. OT Short Term Goal 3 (Week 1): Pt will demonstrate her ability to do diplopia visual exercises on her own without cues.  Skilled Therapeutic Interventions/Progress Updates:     Pt received in bed ready for therapy.  Focus of therapy session on R side coordination and visual motor skills.      ADL Retraining: -pt declined bathing as she wanted to keep on her same clothing - pt stood at sink to complete washing face, brushing teeth with distant supervision  Therapeutic Activity/ Exercise: -visual motor exercises using Brock string,   saccade strips with letters,  L eye only visual tracking, B eye tracking. Pt's diplopia continues to be intermittent but most often present in L visual field and about 10 ft away from her. -improved motor control of L eye in conjuction with R eye  Transfers: - supervision with Rollater, ambulated to gym with rollator with supervision   Neuromuscular Re-Education:  - R hand coordination with weighted ball reaching for various targets - quick grasp and release activities for R hand coordination and reaction time  Pt resting in w/c with all needs met.   call light in reach.     Therapy Documentation Precautions:  Precautions Precautions: Fall Precaution Comments: mild R ataxia Restrictions Weight Bearing Restrictions: No   Pain: Pain Assessment Pain Score: 0-No pain ADL: ADL ADL Comments: close supervision with all tasks of self care, CGA with ambulation with  RW  Therapy/Group: Individual Therapy  Merdis Snodgrass 04/20/2023, 1:19 PM

## 2023-04-20 NOTE — Progress Notes (Signed)
Inpatient Rehabilitation Care Coordinator Assessment and Plan Patient Details  Name: Christina Orozco MRN: [id number] Date of Birth: Oct 29, 1970  Today's Date: 04/20/2023  Hospital Problems: Principal Problem:   Stroke (cerebrum) [facility])  Past Medical History:  Past Medical History:  Diagnosis Date   Anemia    severe   Anxiety    Asthma    CKD (chronic kidney disease)    stage IV   DDD (degenerative disc disease), cervical    Depression    Diabetes mellitus (HCC)    Dislocated shoulder    right   GERD (gastroesophageal reflux disease)    Headache    migraines (about once a month)   History of kidney stones    Hypertension    Neuropathy    Peripheral vascular disease (HCC)    Pneumonia    Stroke [facility])    Past Surgical History:  Past Surgical History:  Procedure Laterality Date   ADENOIDECTOMY     APPENDECTOMY     CERVICAL SPINE SURGERY  06/2012   C5-C6   EYE SURGERY     left eye surgery    FOOT SURGERY     KENALOG INJECTION Left 03/10/2021   Procedure: Intravitreal Avastin Injection;  Surgeon: Stephannie Li, MD;  Location: [facility] OR;  Service: Ophthalmology;  Laterality: Left;   LOOP RECORDER INSERTION N/A 08/27/2017   Procedure: LOOP RECORDER INSERTION;  Surgeon: Thurmon Fair, MD;  Location: [facility] INVASIVE CV LAB;  Service: Cardiovascular;  Laterality: N/A;   MEMBRANE PEEL Left 03/10/2021   Procedure: MEMBRANE PEEL;  Surgeon: Stephannie Li, MD;  Location: [facility] OR;  Service: Ophthalmology;  Laterality: Left;   PARS PLANA VITRECTOMY Left 03/10/2021   Procedure: TWENTY-FIVE GAUGE PARS PLANA VITRECTOMY;  Surgeon: Stephannie Li, MD;  Location: [facility] OR;  Service: Ophthalmology;  Laterality: Left;   PHOTOCOAGULATION WITH LASER Left 03/10/2021   Procedure: PHOTOCOAGULATION WITH LASER;  Surgeon: Stephannie Li, MD;  Location: [facility] OR;  Service: Ophthalmology;  Laterality: Left;   SHOULDER SURGERY     TEE WITHOUT CARDIOVERSION N/A 08/27/2017   Procedure: TRANSESOPHAGEAL ECHOCARDIOGRAM  (TEE);  Surgeon: Thurmon Fair, MD;  Location: [facility] ENDOSCOPY;  Service: Cardiovascular;  Laterality: N/A;   TONSILLECTOMY     Social History:  reports that she has never smoked. She has never used smokeless tobacco. She reports that she does not drink alcohol and does not use drugs.  Family / Support Systems Marital Status: Single Spouse/Significant Other: n/a Children: Daughter (not involved any longer), son (in [place]) Other Supports: Brother, Information systems manager Anticipated Caregiver: Earnie Larsson, Brother (Intermittent Assistance Only) Ability/Limitations of Caregiver: Intermittent Assistance Only (Works) Caregiver Availability: Intermittent Family Dynamics: No longer has support from S.O and daughter. Patient has intermittent support from brother. Son in [place].  Social History Preferred language: English Religion: Baptist Cultural Background: n/a Education: HS Health Literacy - How often do you need to have someone help you when you read instructions, pamphlets, or other written material from your doctor or pharmacy?: Never Writes: Yes Employment Status: Disabled Marine scientist Issues: n/a Guardian/Conservator: n/a   Abuse/Neglect Abuse/Neglect Assessment Can Be Completed: Yes Physical Abuse: Denies Verbal Abuse: Denies Sexual Abuse: Denies Exploitation of patient/patient's resources: Denies Self-Neglect: Denies  Patient response to: Social Isolation - How often do you feel lonely or isolated from those around you?: Never  Emotional Status Pt's affect, behavior and adjustment status: Pleasant Recent Psychosocial Issues: Coping Psychiatric History: Hx of anxiety and depression Substance Abuse History: N/A  Patient / Family Perceptions, Expectations & Goals Pt/Family  understanding of illness & functional limitations: Yes. Premorbid pt/family roles/activities: Independent only going out for appointments and shopping. Anticipated changes in roles/activities/participation:  Patient anticipates discharging home alone with intermittent assistance from brother. No other family assistance. Pt/family expectations/goals: MOD I (No support)  [employer]: None Premorbid Home Care/DME Agencies: Other (Comment) (BSC, Rollator, Rolling Walker, [facility]) Transportation available at discharge: [facility] [place] Is the patient able to respond to transportation needs?: Yes In the past 12 months, has lack of transportation kept you from medical appointments or from getting medications?: No In the past 12 months, has lack of transportation kept you from meetings, work, or from getting things needed for daily living?: No Resource referrals recommended: Neuropsychology  Discharge Planning Living Arrangements: Alone Support Systems: Other relatives Type of Residence: Private residence ([place], level entry) Insurance Resources: Media planner (specify) ([employer] Medicare) Financial Screen Referred: No Living Expenses: Psychologist, sport and exercise Management: Patient Does the patient have any problems obtaining your medications?: No Home Management: Independent Patient/Family Preliminary Plans: Plans to continue to manage independetntly Care Coordinator Barriers to Discharge: Decreased caregiver support, Lack of/limited family support, Insurance for SNF coverage Care Coordinator Barriers to Discharge Comments: No support Care Coordinator Anticipated Follow Up Needs: HH/OP Expected length of stay: 14-16 Days  Clinical Impression Prior patient on CIR. Sw met with patient, introduced self and explained role. Patient familiar with CIR process. Patient plans to discharge home independently with brother, [name]. Brother works and unable to provide 24/7 supervision or support.   Patient discharging home to a 1 level home, level entry. Patient has Rollator, rolling walker, SPC and BSC. No additional questions or concerns.   Andria Rhein 04/20/2023, 9:52 AM

## 2023-04-20 NOTE — Progress Notes (Signed)
Physical Therapy Session Note  Patient Details  Name: Christina Orozco MRN: [id number] Date of Birth: 08-19-1970  Today's Date: 04/20/2023 PT Individual Time: 1005-1030 PT Individual Time Calculation (min): 25 min   Short Term Goals: Week 1:  PT Short Term Goal 1 (Week 1): Pt will perform standing transfers with overall supervision. PT Short Term Goal 2 (Week 1): Pt will ambulate at least 200 ft with rollator and supervision. PT Short Term Goal 3 (Week 1): Pt will demonstrate MCID improvement in outcome measure. PT Short Term Goal 4 (Week 1): Pt will demonstrate improvement in maintaining path with minimal deviation during ambulation using rollator.  Skilled Therapeutic Interventions/Progress Updates:     Pt received supine in bed and agrees to therapy. Reports pain in neck> PT provides repositioning and rest breaks to manage pain. Pt performs supine to sit with bed features and increased time required, as well as cues for positioning at EOB. PT assists to don shoes prior to mobility. Pt ambulates to dayroom with rollator and close supervision, with cues for upright gaze to improve posture and balance. Pt completes Nustep for endurance training and reciprocal coordination. Pt completes x10:00 at workload of 4 with average steps per minute ~50. PT provides cues for hand and foot placement and completing full available ROM. Pt then ambulates back to room with rollator. Left supine with alarm intact and all need within reach.   Therapy Documentation Precautions:  Precautions Precautions: Fall Precaution Comments: mild R ataxia Restrictions Weight Bearing Restrictions: No  Therapy/Group: Individual Therapy  Beau Fanny, PT, DPT 04/20/2023, 4:12 PM

## 2023-04-20 NOTE — IPOC Note (Signed)
Overall Plan of Care [facility]) Patient Details Name: Christina Orozco MRN: [id number] DOB: 03-Oct-1970  Admitting Diagnosis: Stroke (cerebrum) [facility])  Hospital Problems: Principal Problem:   Stroke (cerebrum) [facility])     Functional Problem List: Nursing Bowel, Safety, Endurance, Medication Management  PT Balance, Endurance, Motor, Nutrition, Safety  OT Balance, Motor, Vision  SLP    TR         Basic ADL's: OT Grooming, Bathing, Dressing, Toileting, Eating     Advanced  ADL's: OT Simple Meal Preparation, Light Housekeeping     Transfers: PT Bed to Chair, Car, Occupational psychologist, Research scientist (life sciences): PT Ambulation, Stairs     Additional Impairments: OT Fuctional Use of Upper Extremity  SLP        TR      Anticipated Outcomes Item Anticipated Outcome  Self Feeding independent  Swallowing      Basic self-care  Mod I  Toileting  Mod I   Bathroom Transfers Mod I  Bowel/Bladder  manage bowel w mod I assist  Transfers  Mod I  Locomotion  Mod I/ supervision  Communication     Cognition     Pain  n/a  Safety/Judgment  manage w cues   Therapy Plan: PT Intensity: Minimum of 1-2 x/day ,45 to 90 minutes PT Frequency: 5 out of 7 days PT Duration Estimated Length of Stay: 10-12 days OT Intensity: Minimum of 1-2 x/day, 45 to 90 minutes OT Frequency: 5 out of 7 days OT Duration/Estimated Length of Stay: 10-12 days     Team Interventions: Nursing Interventions Medication Management, Patient/Family Education, Discharge Planning, Disease Management/Prevention, Bowel Management  PT interventions Ambulation/gait training, Cognitive remediation/compensation, Discharge planning, DME/adaptive equipment instruction, Functional mobility training, Pain management, Psychosocial support, Splinting/orthotics, Therapeutic Activities, UE/LE Strength taining/ROM, Visual/perceptual remediation/compensation, Wheelchair propulsion/positioning, UE/LE Coordination activities,  Therapeutic Exercise, Stair training, Skin care/wound management, Patient/family education, Neuromuscular re-education, Functional electrical stimulation, Disease management/prevention, Firefighter, Warden/ranger  OT Interventions Warden/ranger, Discharge planning, Neuromuscular re-education, Psychosocial support, Patient/family education, Self Care/advanced ADL retraining, Therapeutic Activities, Therapeutic Exercise, UE/LE Strength taining/ROM, UE/LE Coordination activities, Visual/perceptual remediation/compensation  SLP Interventions    TR Interventions    SW/CM Interventions Discharge Planning, Psychosocial Support, Patient/Family Education, Disease Management/Prevention   Barriers to Discharge MD  Medical stability  Nursing Home environment access/layout, Lack of/limited family support 1 level/level entry solo; She lives alone and brother checks in weekly. Has frozen dinners due to poor vision--unable to read regular print or see TV (just listens to it). Takes SCAT.Tub bench, Grab bars - tub/shower, Rollator (4 wheels), Rolling Walker (2 wheels), Cane - single point, BSC/3in1  Additional Comments: trying to get a PCA back;  PT Decreased caregiver support, Insurance for SNF coverage, Lack of/limited family support, Nutrition means    OT Decreased caregiver support pt will only have intermittent A from her brother  SLP      SW Decreased caregiver support, Lack of/limited family support, Community education officer for SNF coverage No support   Team Discharge Planning: Destination: PT-Home ,OT- Home , SLP-  Projected Follow-up: PT-24 hour supervision/assistance, Outpatient PT, OT-  Home health OT, SLP-  Projected Equipment Needs: PT-To be determined, OT- None recommended by OT, SLP-  Equipment Details: PT- , OT-  Patient/family involved in discharge planning: PT- Patient,  OT-Patient, SLP-   MD ELOS: 7-10d Medical Rehab Prognosis:  Fair Assessment: The patient has  been admitted for CIR therapies with the diagnosis of Stroke. The team will be addressing functional  mobility, strength, stamina, balance, safety, adaptive techniques and equipment, self-care, bowel and bladder mgt, patient and caregiver education, CKD 4 with worsening renal fxn . Goals have been set at Mod I. Anticipated discharge destination is Home.        See Team Conference Notes for weekly updates to the plan of care

## 2023-04-20 NOTE — Progress Notes (Signed)
Physical Therapy Session Note  Patient Details  Name: Christina Orozco MRN: [id number] Date of Birth: 1971/03/03  Today's Date: 04/20/2023 PT Individual Time: 4098-1191; 1410 - 1307 PT Individual Time Calculation (min): 59 min; 57 min   Short Term Goals: Week 1:  PT Short Term Goal 1 (Week 1): Pt will perform standing transfers with overall supervision. PT Short Term Goal 2 (Week 1): Pt will ambulate at least 200 ft with rollator and supervision. PT Short Term Goal 3 (Week 1): Pt will demonstrate MCID improvement in outcome measure. PT Short Term Goal 4 (Week 1): Pt will demonstrate improvement in maintaining path with minimal deviation during ambulation using rollator.  SESSION 1 Skilled Therapeutic Interventions/Progress Updates: Patient supine in bed on entrance to room. Patient alert and agreeable to PT session.   Therapeutic Activity: Bed Mobility: Pt performed supine<>sit on EOB with supervision (HOB elevated. Pt doffed hospital gown and donned personal top/bottom clothing with far supervision for privacy. Pt moved to EOB and stood to donn pants around waist (BP monitored prior to standing due to pt reporting 5/10 dizziness - checked sitting EOB and standing as indicated below with pt denying increase in symptoms in standing). Transfers: Pt performed sit<>stand transfers throughout session with supervision. Provided VC for locking mechanism engagement prior to sitting.  Pt ambulated to toilet in room in rollator with supervision. Pt with closed door supervision for privacy and performed self hygiene. Pt with CGA while donning personal pants after finishing for safety and ambulated to sink to wash hands.   Gait Training:  Pt ambulated from room<>day room gym using rollator with close supervision for safety. Pt presented with flat foot steps at first, then self corrected before requiring VC (slight sway in rollator as pt had to think about heel-toe).  Neuromuscular Re-ed: NMR  facilitated during session with focus on dynamic standing balance, coordination, R hand dexterity, and safety awareness. - Pt to use R hand (cues for pincher grasp) to take off blue and black clothes pins and squigs off of mirror (pin on squigs) and place on table on opposite end of bowling pins. Pt to increase awareness to L visual field deficits to avoid knocking over cones. Pt required increased time to navigate but demonstrated ability to do so without knocking over any cones. Pt provided with L HHA at first, the progressed to CGA for safety with no true LOB.  NMR performed for improvements in motor control and coordination, balance, sequencing, judgement, and self confidence/ efficacy in performing all aspects of mobility at highest level of independence.   Patient supine in bed at end of session with brakes locked, bed alarm set, and all needs within reach.  SESSION 2 Skilled Therapeutic Interventions/Progress Updates: Patient supine in bed on entrance to room. Patient alert and agreeable to PT session.   Patient reported no pain at beginning of PT session.  Therapeutic Activity: Bed Mobility: Pt performed supine<>sit on EOB with supervision (HOB slightly elevated).  Transfers: Pt performed sit<>stand transfers throughout session with supervision and to rollator. Provided VC for pt to ensure locking mechanism in rollator is engaged.  Gait Training:  Pt ambulated 150'+ using rollator with close supervision for safety and on noncompliant surfaces. Pt demonstrated the following gait deviations with therapist providing the described cuing and facilitation for improvement:  - Decreased heel strike (pt with hint cue to perform sequence - pt educated on how heel strike reduces compressive forces on knee joint capsule) - Decrease step clearance (no noted sliding of  L foot this session throughout, and pt demos safe clearance to step up incline while outside on sidewalk, and to decrease speed when  descending accordingly) - pt cued to shift weight slightly anteriorly when ascending as to avoid posterior LOB.   Stair Navigation: - - Ascending 4 (6") steps with use of B HR, and with CGA. Pt with step to pattern. Pt reported that leading with R LE felt safer and stronger vs left. Pt with x 2 laps without rest - Descending 4 (6") steps with use of B HR, and with CGA for safety. Pt with step to pattern. Pt reported that stepping down with R LE felt better vs L (pt cued to step down with R as pt stepped down with L initially on 1st round).  Pt did not report any adverse symptoms previously experienced (pt reported some burning sensation in B eyes that began in OPPT prior to/around stroke event and during evaluation).   Patient supine in bed at end of session with brakes locked, bed alarm set, and all needs within reach.       Therapy Documentation Precautions:  Precautions Precautions: Fall Precaution Comments: mild R ataxia Restrictions Weight Bearing Restrictions: No  Vitals Session 1 BP - 149/79 (100) sitting EOB  BP - 131/76 (92) standing   Therapy/Group: Individual Therapy  Rachyl Wuebker PTA 04/20/2023, 12:37 PM

## 2023-04-21 ENCOUNTER — Inpatient Hospital Stay (HOSPITAL_COMMUNITY): Payer: 59

## 2023-04-21 DIAGNOSIS — E113591 Type 2 diabetes mellitus with proliferative diabetic retinopathy without macular edema, right eye: Secondary | ICD-10-CM

## 2023-04-21 DIAGNOSIS — E1122 Type 2 diabetes mellitus with diabetic chronic kidney disease: Secondary | ICD-10-CM | POA: Diagnosis not present

## 2023-04-21 DIAGNOSIS — H532 Diplopia: Secondary | ICD-10-CM | POA: Diagnosis not present

## 2023-04-21 DIAGNOSIS — E1149 Type 2 diabetes mellitus with other diabetic neurological complication: Secondary | ICD-10-CM | POA: Diagnosis not present

## 2023-04-21 DIAGNOSIS — I63311 Cerebral infarction due to thrombosis of right middle cerebral artery: Secondary | ICD-10-CM | POA: Diagnosis not present

## 2023-04-21 LAB — RENAL FUNCTION PANEL
Albumin: 3.3 g/dL — ABNORMAL LOW (ref 3.5–5.0)
Anion gap: 6 (ref 5–15)
BUN: 58 mg/dL — ABNORMAL HIGH (ref 6–20)
CO2: 20 mmol/L — ABNORMAL LOW (ref 22–32)
Calcium: 9.4 mg/dL (ref 8.9–10.3)
Chloride: 112 mmol/L — ABNORMAL HIGH (ref 98–111)
Creatinine, Ser: 2.96 mg/dL — ABNORMAL HIGH (ref 0.44–1.00)
GFR, Estimated: 19 mL/min — ABNORMAL LOW (ref >60)
Glucose, Bld: 102 mg/dL — ABNORMAL HIGH (ref 70–99)
Phosphorus: 4.7 mg/dL — ABNORMAL HIGH (ref 2.5–4.6)
Potassium: 4.4 mmol/L (ref 3.5–5.1)
Sodium: 138 mmol/L (ref 135–145)

## 2023-04-21 LAB — URINALYSIS, ROUTINE W REFLEX MICROSCOPIC
Bilirubin Urine: NEGATIVE
Glucose, UA: NEGATIVE mg/dL
Ketones, ur: NEGATIVE mg/dL
Nitrite: NEGATIVE
Protein, ur: NEGATIVE mg/dL
Specific Gravity, Urine: 1.009 (ref 1.005–1.030)
pH: 5 (ref 5.0–8.0)

## 2023-04-21 LAB — IRON AND TIBC
Iron: 46 ug/dL (ref 28–170)
Saturation Ratios: 19 % (ref 10.4–31.8)
TIBC: 244 ug/dL — ABNORMAL LOW (ref 250–450)
UIBC: 198 ug/dL

## 2023-04-21 LAB — FERRITIN: Ferritin: 82 ng/mL (ref 11–307)

## 2023-04-21 LAB — OCCULT BLOOD X 1 CARD TO LAB, STOOL: Fecal Occult Bld: NEGATIVE

## 2023-04-21 MED ORDER — TROLAMINE SALICYLATE 10 % EX CREA: TOPICAL_CREAM | Freq: Two times a day (BID) | CUTANEOUS | Status: DC | PRN

## 2023-04-21 MED ORDER — SORBITOL 70 % SOLN: 30.0000 mL | Freq: Every day | Status: DC | PRN

## 2023-04-21 MED ORDER — SODIUM CHLORIDE 0.9 % IV SOLN
200.0000 mg | INTRAVENOUS | Status: AC
  Administered 2023-04-21 – 2023-04-25 (×5): 200 mg via INTRAVENOUS
  Filled 2023-04-21 (×5): qty 10

## 2023-04-21 NOTE — Consult Note (Signed)
Nephrology Consult   Requesting provider: Claudette Laws Service requesting consult: PMR Reason for consult: AKI on CKD IV   Assessment/Recommendations: Christina Orozco is a/an 52 y.o. female with a past medical history CVA, right-sided weakness, HTN, CKD 4, DM2 who present w/ stroke c/b AKI   Non-Oliguric AKI on CKD IV: Baseline creatinine around 2.6-2.9.  Recent elevation in creatinine.  Unclear cause.  Could just be a fluctuation.  -Obtain urinalysis and renal ultrasound -Encourage oral hydration -Continue to monitor daily Cr, Dose meds for GFR -Monitor Daily I/Os, Daily weight  -Maintain MAP>65 for optimal renal perfusion.  -Avoid nephrotoxic medications including NSAIDs -Use synthetic opioids (Fentanyl/Dilaudid) if needed -Currently no indication for HD  Hypertension: Blood pressure moderately elevated.  Continue amlodipine 10 mg daily, carvedilol 12.5 mg twice daily.  Metabolic acidosis: Mild.  Continue sodium bicarbonate  CVA: Continue medications as prescribed.  Continue with rehab  DM2: Management per primary team  Anemia: Hemoglobin 7.7.  Obtain iron studies.  Could consider ESA   Recommendations conveyed to primary service.    Darnell [facility] 04/21/2023 10:27 AM   _____________________________________________________________________________________ CC: stroke  History of Present Illness: Christina Orozco is a/an 52 y.o. female with a past medical history of CVA, right-sided weakness, HTN, CKD 4, DM2 who presents with stroke.  Patient presented to the hospital early November with worsening visual issues.  She was found to have a stroke neurology was consulted with no changes to her current medical therapy due to history of recurrent stroke.  She did have some intractable nausea and vomiting which improved with supportive care.  She was discharged to rehab.  While in rehab she is continued to work with therapy and feels like she is  getting stronger.  She denies any fevers, chills, shortness of breath, chest pain, nausea, vomiting, diarrhea, dysuria, hematuria.  She does have chronic kidney disease and follows with Dr. Lequita Halt.  She states she did have an issue with urinary retention several years ago.  Does not feel like she is retaining at this time.  She feels like she is urinating well.  Routine labs noted her creatinine was 3.5 yesterday with baseline around 2.6-2.9   Medications:  Current Facility-Administered Medications  Medication Dose Route Frequency Provider Last Rate Last Admin   acetaminophen (TYLENOL) tablet 325-650 mg  325-650 mg Oral Q4H PRN Love, Pamela S, PA-C       albuterol (PROVENTIL) (2.5 MG/3ML) 0.083% nebulizer solution 2.5 mg  2.5 mg Nebulization Q4H PRN Love, Pamela S, PA-C       alum & mag hydroxide-simeth (MAALOX/MYLANTA) 200-200-20 MG/5ML suspension 30 mL  30 mL Oral Q4H PRN Love, Pamela S, PA-C       amLODipine (NORVASC) tablet 10 mg  10 mg Oral Daily Delle Reining S, PA-C   10 mg at 04/21/23 0755   aspirin EC tablet 81 mg  81 mg Oral BID Jacquelynn Cree, PA-C   81 mg at 04/21/23 0755   atorvastatin (LIPITOR) tablet 80 mg  80 mg Oral QHS Jacquelynn Cree, PA-C   80 mg at 04/20/23 2141   bisacodyl (DULCOLAX) suppository 10 mg  10 mg Rectal Daily PRN Love, Pamela S, PA-C       carvedilol (COREG) tablet 12.5 mg  12.5 mg Oral BID WC LoveRinaldo Cloud S, PA-C   12.5 mg at 04/21/23 0755   diphenhydrAMINE (BENADRYL) capsule 25 mg  25 mg Oral Q8H PRN Jacquelynn Cree, PA-C  enoxaparin (LOVENOX) injection 30 mg  30 mg Subcutaneous Q24H Pham, Minh Q, RPH-CPP   30 mg at 04/20/23 1326   FLUoxetine (PROZAC) capsule 20 mg  20 mg Oral Daily Jacquelynn Cree, PA-C   20 mg at 04/21/23 0755   guaiFENesin-dextromethorphan (ROBITUSSIN DM) 100-10 MG/5ML syrup 5-10 mL  5-10 mL Oral Q6H PRN Love, Pamela S, PA-C       melatonin tablet 5 mg  5 mg Oral QHS PRN Love, Pamela S, PA-C       montelukast (SINGULAIR) tablet 10 mg  10  mg Oral QHS Love, Pamela S, PA-C   10 mg at 04/20/23 2142   naphazoline-glycerin (CLEAR EYES REDNESS) ophth solution 1-2 drop  1-2 drop Left Eye BID Jacquelynn Cree, PA-C   2 drop at 04/21/23 0756   pantoprazole (PROTONIX) EC tablet 40 mg  40 mg Oral Daily Jacquelynn Cree, PA-C   40 mg at 04/21/23 6644   prochlorperazine (COMPAZINE) tablet 5-10 mg  5-10 mg Oral Q6H PRN Delle Reining S, PA-C       Or   prochlorperazine (COMPAZINE) suppository 12.5 mg  12.5 mg Rectal Q6H PRN Love, Pamela S, PA-C       Or   prochlorperazine (COMPAZINE) injection 5-10 mg  5-10 mg Intravenous Q6H PRN Love, Pamela S, PA-C       sodium bicarbonate tablet 650 mg  650 mg Oral BID Love, Pamela S, PA-C   650 mg at 04/21/23 0755   sodium phosphate (FLEET) enema 1 enema  1 enema Rectal Once PRN Jacquelynn Cree, PA-C       sorbitol 70 % solution 30 mL  30 mL Oral Daily PRN Kirsteins, Victorino Sparrow, MD       ticagrelor Marden Noble) tablet 90 mg  90 mg Oral BID Love, Pamela S, PA-C   90 mg at 04/21/23 0347     ALLERGIES Lisinopril, Penicillins, Quetiapine, and Gabapentin  MEDICAL HISTORY Past Medical History:  Diagnosis Date   Anemia    severe   Anxiety    Asthma    CKD (chronic kidney disease)    stage IV   DDD (degenerative disc disease), cervical    Depression    Diabetes mellitus (HCC)    Dislocated shoulder    right   GERD (gastroesophageal reflux disease)    Headache    migraines (about once a month)   History of kidney stones    Hypertension    Neuropathy    Peripheral vascular disease (HCC)    Pneumonia    Stroke [facility])      SOCIAL HISTORY Social History   Socioeconomic History   Marital status: Single    Spouse name: Not on file   Number of children: 3   Years of education: 12+   Highest education level: Some college, no degree  Occupational History   Not on file  Tobacco Use   Smoking status: Never   Smokeless tobacco: Never  Vaping Use   Vaping status: Never Used  Substance and Sexual  Activity   Alcohol use: No   Drug use: No   Sexual activity: Not Currently  Other Topics Concern   Not on file  Social History Narrative   Lives with daughter   Caffeine use: "once in a blue moon"   Right handed   Social Determinants of Health   Financial Resource Strain: Medium Risk (09/06/2022)   Received from [facility]   Overall Financial Resource Strain (CARDIA)  Difficulty of Paying Living Expenses: Somewhat hard  Food Insecurity: Low Risk  (02/27/2023)   Received from [facility]   Hunger Vital Sign    Worried About Running Out of Food in the Last Year: Never true    Ran Out of Food in the Last Year: Never true  Transportation Needs: No Transportation Needs (02/27/2023)   Received from [facility]    In the past 12 months, has lack of reliable transportation kept you from medical appointments, meetings, work or from getting things needed for daily living? : No  Physical Activity: Not on file  Stress: Not on file  Social Connections: Unknown (10/10/2021)   Received from [facility]   Social Network    Social Network: Not on file  Intimate Partner Violence: Not At Risk (10/28/2022)   Humiliation, Afraid, Rape, and Kick questionnaire    Fear of Current or Ex-Partner: No    Emotionally Abused: No    Physically Abused: No    Sexually Abused: No     FAMILY HISTORY Family History  Problem Relation Age of Onset   Vascular Disease Mother    CAD Mother    Heart failure Mother    Heart disease Other    Cancer Other        colon, stomach, pancreatic, lung   Diabetes Other    Breast cancer Sister 30   Seizures Maternal Uncle       Review of Systems: 12 systems reviewed Otherwise as per HPI, all other systems reviewed and negative  Physical Exam: Vitals:   04/21/23 0400 04/21/23 0449  BP: (!) 163/77 (!) 156/83  Pulse: 73 78  Resp: 20 18  Temp: 98.1 F (36.7 C)   SpO2: 100% 100%   Total I/O In: 240  [P.O.:240] Out: -   Intake/Output Summary (Last 24 hours) at 04/21/2023 1027 Last data filed at 04/21/2023 0755 Gross per 24 hour  Intake 900 ml  Output --  Net 900 ml   General: well-appearing, no acute distress HEENT: anicteric sclera, oropharynx clear without lesions CV: normal rate, no rub Lungs: clear to auscultation bilaterally, normal work of breathing Abd: soft, non-tender, non-distended Skin: no visible lesions or rashes Psych: alert, engaged, appropriate mood and affect Musculoskeletal: no obvious deformities Neuro: normal speech and cognition  Test Results Reviewed Lab Results  Component Value Date   NA 139 04/20/2023   K 3.7 04/20/2023   CL 114 (H) 04/20/2023   CO2 21 (L) 04/20/2023   BUN 56 (H) 04/20/2023   CREATININE 3.45 (H) 04/20/2023   CALCIUM 8.8 (L) 04/20/2023   ALBUMIN 3.2 (L) 04/18/2023   PHOS 5.1 (H) 12/23/2021    CBC Recent Labs  Lab 04/14/23 1135 04/18/23 0531  WBC 10.8* 6.0  NEUTROABS 9.0* 3.6  HGB 9.4* 7.7*  HCT 28.8* 23.9*  MCV 86.5 88.8  PLT 290 190    I have reviewed all relevant outside healthcare records related to the patient's current hospitalization

## 2023-04-21 NOTE — Progress Notes (Signed)
PROGRESS NOTE   Subjective/Complaints:  Pt states that for the last week , has noticed a white light at edge of vision on the right side that comes on intermittently and lasts "a couple minutes" . Denies eye pain , tearing, loss of vision  ROS :  neg CP, SOB, N/V/D  Objective:   No results found. No results for input(s): "WBC", "HGB", "HCT", "PLT" in the last 72 hours.  Recent Labs    04/20/23 0504  NA 139  K 3.7  CL 114*  CO2 21*  GLUCOSE 112*  BUN 56*  CREATININE 3.45*  CALCIUM 8.8*    Intake/Output Summary (Last 24 hours) at 04/21/2023 0959 Last data filed at 04/21/2023 0755 Gross per 24 hour  Intake 900 ml  Output --  Net 900 ml        Physical Exam: Vital Signs Blood pressure (!) 156/83, pulse 78, temperature 98.1 F (36.7 C), temperature source Oral, resp. rate 18, height 5\' 2"  (1.575 m), weight 59.6 kg, last menstrual period 05/22/2018, SpO2 100%.   General: No acute distress Mood and affect are appropriate Heart: Regular rate and rhythm no rubs murmurs or extra sounds Lungs: Clear to auscultation, breathing unlabored, no rales or wheezes Abdomen: Positive bowel sounds, soft nontender to palpation, nondistended Extremities: No clubbing, cyanosis, or edema Skin: No evidence of breakdown, no evidence of rash Neurologic:  right eye eye with reduce adduction when attempting left lateral gaze  + left horz  nystagmus , motor strength is 4/5 in bilateral deltoid, bicep, tricep, grip, hip flexor, knee extensors, ankle dorsiflexor and plantar flexor Visual fields intact in all 4 quadrants with confrontation testing   Musculoskeletal: Full range of motion in all 4 extremities. No joint swelling Eyes- no erythema or tearing   Assessment/Plan: 1. Functional deficits which require 3+ hours per day of interdisciplinary therapy in a comprehensive inpatient rehab setting. Physiatrist is providing close team  supervision and 24 hour management of active medical problems listed below. Physiatrist and rehab team continue to assess barriers to discharge/monitor patient progress toward functional and medical goals  Care Tool:  Bathing    Body parts bathed by patient: Right arm, Left arm, Chest, Abdomen, Front perineal area, Buttocks, Right upper leg, Face, Left lower leg, Right lower leg, Left upper leg         Bathing assist Assist Level: Set up assist     Upper Body Dressing/Undressing Upper body dressing   What is the patient wearing?: Bra, Pull over shirt    Upper body assist Assist Level: Set up assist    Lower Body Dressing/Undressing Lower body dressing      What is the patient wearing?: Underwear/pull up, Pants     Lower body assist Assist for lower body dressing: Supervision/Verbal cueing     Toileting Toileting    Toileting assist Assist for toileting: Supervision/Verbal cueing     Transfers Chair/bed transfer  Transfers assist     Chair/bed transfer assist level: Contact Guard/Touching assist     Locomotion Ambulation   Ambulation assist      Assist level: Minimal Assistance - Patient > 75% Assistive device: Walker-rolling Max distance: 200 ft  Walk 10 feet activity   Assist     Assist level: Minimal Assistance - Patient > 75% Assistive device: No Device   Walk 50 feet activity   Assist    Assist level: Minimal Assistance - Patient > 75% Assistive device: Walker-rolling    Walk 150 feet activity   Assist    Assist level: Minimal Assistance - Patient > 75% Assistive device: Walker-rolling    Walk 10 feet on uneven surface  activity   Assist Walk 10 feet on uneven surfaces activity did not occur: Safety/medical concerns         Wheelchair     Assist Is the patient using a wheelchair?: No   Wheelchair activity did not occur: N/A         Wheelchair 50 feet with 2 turns activity    Assist    Wheelchair 50  feet with 2 turns activity did not occur: N/A       Wheelchair 150 feet activity     Assist  Wheelchair 150 feet activity did not occur: N/A       Blood pressure (!) 156/83, pulse 78, temperature 98.1 F (36.7 C), temperature source Oral, resp. rate 18, height 5\' 2"  (1.575 m), weight 59.6 kg, last menstrual period 05/22/2018, SpO2 100%.  Medical Problem List and Plan: 1. 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  Antithrombotics: -DVT/anticoagulation:  Pharmaceutical: Lovenox             -antiplatelet therapy: ASA/Brilinta.  3. Pain Management: Tylenol for pain prn. 4. Mood/Behavior/Sleep: Melatonin prn.             -antipsychotic agents: LCSW to follow for evaluation and support.  5. Neuropsych/cognition: This patient is capable of making decisions on her own behalf. 6. Skin/Wound Care: Routine pressure relief measures.  7. Fluids/Electrolytes/Nutrition: Monitor I/O. Monitor BMET  8. HTN: Monitor BP TID. On Coreg and amlodipine.              --Continues to be limited by dizziness--monitor for orthostatic changes.              -pt emphatic was using pill packs to do meds/bubble packs and says was compliant with meds? Vitals:   04/21/23 0400 04/21/23 0449  BP: (!) 163/77 (!) 156/83  Pulse: 73 78  Resp: 20 18  Temp: 98.1 F (36.7 C)   SpO2: 100% 100%  Elevated this morning will continue to monitor 9. T2DM w/retinopathy and neuropathy:  Hgb A1c- 6.1 and well controlled. -- Monitor BS ac/hs and use SSI for elevated BS.  --Was on monjuaro at home CBG (last  3)  Recent Labs    04/18/23 1200  GLUCAP 100*   Has not been receiving semaglutide 0.5mg  [facility] q wk, will d/c in hospital but can resume after d/c  10. 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 Limits fluids to 64  ounces otherwise on regular diet             --resume Bicarb for NAGMA.     Latest Ref Rng & Units 04/20/2023    5:04 AM 04/18/2023    5:31 AM 04/14/2023   11:35 AM  BMP  Glucose 70 - 99 mg/dL 161  096  045   BUN 6 - 20 mg/dL 56  49  32   Creatinine 0.44 - 1.00 mg/dL 4.09  8.11  9.14   Sodium 135 - 145 mmol/L 139  138  143   Potassium 3.5 - 5.1 mmol/L 3.7  3.9  3.5   Chloride 98 - 111 mmol/L 114  111  114   CO2 22 - 32 mmol/L 21  20  19    Calcium 8.9 - 10.3 mg/dL 8.8  9.0  9.7   Increased BUN and Creat will give IVF and recheck in am not on ACE-I or diuretics , will ask for nephro input  11. OSA: Compliant with CPAP 12. H/o Depression: On Prozac--motivated and remains positive.  13. Constipation: will add miralax. If doesn't go by this evening, will add Sorbitol- scared to push  14. Anemia of chronic disease: Monitor H/H with serial checks, Hgb dropped  15. Leucocytosis: Monitor for fevers and other signs of infection.              --recheck WBC in am.      Latest Ref Rng & Units 04/18/2023    5:31 AM 04/14/2023   11:35 AM 04/13/2023   10:30 PM  CBC  WBC 4.0 - 10.5 K/uL 6.0  10.8  10.3   Hemoglobin 12.0 - 15.0 g/dL 7.7  9.4  9.3   Hematocrit 36.0 - 46.0 % 23.9  28.8  28.8   Platelets 150 - 400 K/uL 190  290  273    ? GI blood loss vs chronic kidney , check stool guaic  16.  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  Occ white light at periphery of vision on right side , suspect retinal issues, sees Dr Valere Dross  from optho  for proliferative retinopathy ( also s/p catarct with IOL) will need f/u after discharge  LOS: 4 days A FACE TO FACE  EVALUATION WAS PERFORMED  Erick Colace 04/21/2023, 9:59 AM

## 2023-04-21 NOTE — Progress Notes (Signed)
Physical Therapy Session Note  Patient Details  Name: Christina Orozco MRN: [id number] Date of Birth: 09/11/1970  Today's Date: 04/21/2023 PT Individual Time: 4098-1191 PT Individual Time Calculation (min): 72 min   Short Term Goals: Week 1:  PT Short Term Goal 1 (Week 1): Pt will perform standing transfers with overall supervision. PT Short Term Goal 2 (Week 1): Pt will ambulate at least 200 ft with rollator and supervision. PT Short Term Goal 3 (Week 1): Pt will demonstrate MCID improvement in outcome measure. PT Short Term Goal 4 (Week 1): Pt will demonstrate improvement in maintaining path with minimal deviation during ambulation using rollator.  Skilled Therapeutic Interventions/Progress Updates: Patient supine in bed on entrance to room. Patient alert and agreeable to PT session.   Patient reported no pain at beginning of PT session and continues to endorse getting adequate sleep without medication aid.  Therapeutic Activity: Bed Mobility: Pt performed supine<>sit on EOB with supervision (HOB slightly elevated) modI/supervision. Pt doffed hospital gown and donned personal upper and lower body clothing while sitting in bed and closed supervision for privacy (pt stood EOB to donn lower body clothing with supervision for safety).  Transfers: Pt performed sit<>stand transfers throughout session with rollator or no AD with close supervision. Provided VC to lock B locks on rollator vs one prior to sitting/standing.   Pt ambulated in rollator from EOB to bathroom to void urine (charted) with supervision. Pt with closed door supervision for privacy and demos donning/donning lower body clothing without assistance (PTA provided stand-by with back turned for privacy and safety while pt doffed/donned lower body clothing with no LOB prior to pt sitting and standing by toilet). Pt ambulated to sink and cued to not do so with rollator in front of sink as the seat forces pt to go into heavy forward  flexion at hips that requires pt to use B forearms for support, and to instead lock rollator to the side and use sink for balance via B UE's prn for safety.  Gait Training:  Pt ambulated from room<ortho gym and back to room (following NMRE) using rollator with close supervision. Pt required heavy minA for safety to sit on hi/low mat after reaching ortho gym from room due to fatigue and decreased strength in B LE's. Pt with short rest break prior to NMRE on BITS system.  Pt recalled heel-toe on B LE's prior to PTA having to give cue throughout ambulation.   Pt ambulated from ortho gym back to room in rollator with no reports of fatigue. Pt did, however, report seeing white flashes on lines/light in R peripheral vision (pt stated that it was floaters that were once black but now white - change in vision being new with attending RN and MD notified to follow up). Pt stated that this comes and goes and started the previous day.   Neuromuscular Re-ed: NMR facilitated during session with focus on L visual field awareness, dynamic standing balance and coordination. - BITS system visual pursuit - pt standing with no AD and CGA while finding sequence of "Red, White, Blue." 40 dots on speed 3 (started at 8, then 5, then 3 due to adjusting to pt's beginning parameters). Pt finished first found in 4:26 with 62% accuracy. 2nd round at 75.475 accuraccy with reaction time at 2.05 sec and 1:22 to finish. Pt also slightly to the L to increase challenge due to L visual field deficit - BITS system Bell Cancellation Task - 4:36 to complete with 6 misses and 6  distracted hits. Pt with more time spent on R side of screen (parameter set to R brain infarct). CGA throughout. 2nd with pt finishing in 4:14 with 2 misses and 4 distracted hits (full brain parameter) with pt starting on R side of screen both rounds and moving to the L of screen. Pt cued to say "done" when pt thinks all bells are found. Pt cued to avoid using L UE to  stabilize R UE to pin point bell cancellation task (increased time required to coordinate movement to bell). Pt reported that it was harder to see items on the L of screen vs R due to visual deficit (PTA educated purpose of performing these tasks).   NMR performed for improvements in motor control and coordination, balance, sequencing, judgement, and self confidence/ efficacy in performing all aspects of mobility at highest level of independence.   Patient supine in bed at end of session with brakes locked, bed alarm set, and all needs within reach.      Therapy Documentation Precautions:  Precautions Precautions: Fall Precaution Comments: mild R ataxia Restrictions Weight Bearing Restrictions: No  Therapy/Group: Individual Therapy  Linard Daft PTA 04/21/2023, 11:57 AM

## 2023-04-21 NOTE — Progress Notes (Signed)
Occupational Therapy Session Note  Patient Details  Name: Christina Orozco MRN: [id number] Date of Birth: [birth year]  Today's Date: 04/21/2023 OT Individual Time: 0272-5366 and 1300-1414 OT Individual Time Calculation (min): 44 min and 74 min   Short Term Goals: Week 1:  OT Short Term Goal 1 (Week 1): Pt will demonstrate improved R FMC to be able to open frozen food containers and place food items in microwave. OT Short Term Goal 2 (Week 1): Pt will be able to ambulated to bathroom with RW with supervision only. OT Short Term Goal 3 (Week 1): Pt will demonstrate her ability to do diplopia visual exercises on her own without cues.   Skilled Therapeutic Interventions/Progress Updates:    Session 1: First session pt bed level resting with CPAP, able to manage own device. No pain. Needing to toilet at this time, supine > sit with Supervision and donned shoes set up, walking with rollator to/from bathroom with SBA and toileting all in same manner for hygiene and clothing management. Sitting EOB, focused on [facility] training activities for R hand per pt request for 7/7 letters and shapes all with cues for form and techniques as well as scanning for visual deficits. At end of session left call bell in reach all needs met.   Session 2: Pt bed level at time of session, removing her own CPAP, no pain. Focus of session initially on toileting with pt ambulating to/from bathroom with rollator and performing all toileting with supervision, having a BM and collected stool sample for nursing. Pt performing clothing, hygiene, and hand hygiene all with Supervision for safety and use of rollator thorughout. Ambulated room <> gym same manner and focused on multiple handwriting and precursor skills with R hand to improve legibility and form. In standing and sitting, pt performing activities crossing midline, visual scanning and sequencing, and grip/FMC with right hand all to improve functional use. Back in room set up call  bell in reach all needs met.   Therapy Documentation Precautions:  Precautions Precautions: Fall Precaution Comments: mild R ataxia Restrictions Weight Bearing Restrictions: No     Therapy/Group: Individual Therapy  Erasmo Score 04/21/2023, 7:28 AM

## 2023-04-22 DIAGNOSIS — H532 Diplopia: Secondary | ICD-10-CM | POA: Diagnosis not present

## 2023-04-22 DIAGNOSIS — E1149 Type 2 diabetes mellitus with other diabetic neurological complication: Secondary | ICD-10-CM | POA: Diagnosis not present

## 2023-04-22 DIAGNOSIS — Z794 Long term (current) use of insulin: Secondary | ICD-10-CM

## 2023-04-22 DIAGNOSIS — I63311 Cerebral infarction due to thrombosis of right middle cerebral artery: Secondary | ICD-10-CM | POA: Diagnosis not present

## 2023-04-22 DIAGNOSIS — E1122 Type 2 diabetes mellitus with diabetic chronic kidney disease: Secondary | ICD-10-CM | POA: Diagnosis not present

## 2023-04-22 LAB — RENAL FUNCTION PANEL
Albumin: 2.9 g/dL — ABNORMAL LOW (ref 3.5–5.0)
Anion gap: 9 (ref 5–15)
BUN: 57 mg/dL — ABNORMAL HIGH (ref 6–20)
CO2: 19 mmol/L — ABNORMAL LOW (ref 22–32)
Calcium: 8.6 mg/dL — ABNORMAL LOW (ref 8.9–10.3)
Chloride: 113 mmol/L — ABNORMAL HIGH (ref 98–111)
Creatinine, Ser: 3.09 mg/dL — ABNORMAL HIGH (ref 0.44–1.00)
GFR, Estimated: 18 mL/min — ABNORMAL LOW (ref >60)
Glucose, Bld: 110 mg/dL — ABNORMAL HIGH (ref 70–99)
Phosphorus: 5.1 mg/dL — ABNORMAL HIGH (ref 2.5–4.6)
Potassium: 4.3 mmol/L (ref 3.5–5.1)
Sodium: 141 mmol/L (ref 135–145)

## 2023-04-22 LAB — OCCULT BLOOD X 1 CARD TO LAB, STOOL: Fecal Occult Bld: POSITIVE — AB

## 2023-04-22 MED ORDER — SODIUM CHLORIDE 0.9 % IV SOLN: INTRAVENOUS | Status: AC | PRN

## 2023-04-22 MED ORDER — CEPHALEXIN 250 MG PO CAPS
500.0000 mg | ORAL_CAPSULE | Freq: Every day | ORAL | Status: AC
  Administered 2023-04-22 – 2023-04-26 (×5): 500 mg via ORAL
  Filled 2023-04-22 (×5): qty 2

## 2023-04-22 NOTE — Progress Notes (Signed)
Nephrology Follow-Up Consult note   Assessment/Recommendations: Christina Orozco is a/an 52 y.o. female with a past medical history significant for CVA, right-sided weakness, HTN, CKD 4, DM2 who present w/ stroke c/b AKI    Non-Oliguric AKI on CKD IV: Baseline creatinine around 2.6-2.9.  Creatinine went up to 3.5 now back down to 3.1.  Likely fluctuating around her baseline.  Possibly has UTI based on urinalysis.  Renal ultrasound with some worsening unilateral renal atrophy but nothing to do about that at this time.  Creatinine likely will continue to fluctuate slightly.  Nothing to do from our standpoint.  She has follow-up with her outpatient nephrologist in December -Management of UTI as below -Encourage oral hydration -Continue to monitor daily Cr, Dose meds for GFR -Monitor Daily I/Os, Daily weight  -Maintain MAP>65 for optimal renal perfusion.  -Avoid nephrotoxic medications including NSAIDs -Use synthetic opioids (Fentanyl/Dilaudid) if needed -We will sign off at this time  UTI: No symptoms but because of AKI and urinalysis findings will treat with Keflex.  Obtain urine culture and could adjust antibiotics if needed based on results.   Hypertension: Blood pressure moderately elevated.  Continue amlodipine 10 mg daily, carvedilol 12.5 mg twice daily.   Metabolic acidosis: Mild.  Continue sodium bicarbonate   CVA: Continue medications as prescribed.  Continue with rehab   DM2: Management per primary team   Anemia: Hemoglobin 7.7.  Iron deficient.  Will provide IV iron while she is here.   Recommendations conveyed to primary service.    Darnell Level [facility] 04/22/2023 10:52 AM  ___________________________________________________________  CC: stroke  Interval History/Subjective: Patient has no complaints today.  Renal ultrasound with worsening interval renal atrophy.   Medications:  Current Facility-Administered Medications  Medication Dose  Route Frequency Provider Last Rate Last Admin   acetaminophen (TYLENOL) tablet 325-650 mg  325-650 mg Oral Q4H PRN Love, Pamela S, PA-C       albuterol (PROVENTIL) (2.5 MG/3ML) 0.083% nebulizer solution 2.5 mg  2.5 mg Nebulization Q4H PRN Love, Pamela S, PA-C       alum & mag hydroxide-simeth (MAALOX/MYLANTA) 200-200-20 MG/5ML suspension 30 mL  30 mL Oral Q4H PRN Love, Pamela S, PA-C       amLODipine (NORVASC) tablet 10 mg  10 mg Oral Daily Delle Reining S, PA-C   10 mg at 04/22/23 2952   aspirin EC tablet 81 mg  81 mg Oral BID Jacquelynn Cree, PA-C   81 mg at 04/22/23 0902   atorvastatin (LIPITOR) tablet 80 mg  80 mg Oral QHS Jacquelynn Cree, PA-C   80 mg at 04/21/23 2125   bisacodyl (DULCOLAX) suppository 10 mg  10 mg Rectal Daily PRN Love, Pamela S, PA-C       carvedilol (COREG) tablet 12.5 mg  12.5 mg Oral BID WC Love, Pamela S, PA-C   12.5 mg at 04/22/23 0902   cephALEXin (KEFLEX) capsule 500 mg  500 mg Oral Daily Darnell Level, MD   500 mg at 04/22/23 0902   diphenhydrAMINE (BENADRYL) capsule 25 mg  25 mg Oral Q8H PRN Love, Pamela S, PA-C       enoxaparin (LOVENOX) injection 30 mg  30 mg Subcutaneous Q24H Pham, Minh Q, RPH-CPP   30 mg at 04/21/23 1615   FLUoxetine (PROZAC) capsule 20 mg  20 mg Oral Daily Love, Pamela S, PA-C   20 mg at 04/22/23 0902   guaiFENesin-dextromethorphan (ROBITUSSIN DM) 100-10 MG/5ML syrup 5-10 mL  5-10 mL Oral Q6H  PRN Jacquelynn Cree, PA-C       iron sucrose (VENOFER) 200 mg in sodium chloride 0.9 % 100 mL IVPB  200 mg Intravenous Q24H Darnell Level, MD 440 mL/hr at 04/21/23 1619 200 mg at 04/21/23 1619   melatonin tablet 5 mg  5 mg Oral QHS PRN Love, Pamela S, PA-C       montelukast (SINGULAIR) tablet 10 mg  10 mg Oral QHS Love, Pamela S, PA-C   10 mg at 04/21/23 2125   naphazoline-glycerin (CLEAR EYES REDNESS) ophth solution 1-2 drop  1-2 drop Left Eye BID Jacquelynn Cree, PA-C   1 drop at 04/22/23 0906   pantoprazole (PROTONIX) EC tablet 40 mg  40 mg Oral  Daily Jacquelynn Cree, PA-C   40 mg at 04/22/23 1191   prochlorperazine (COMPAZINE) tablet 5-10 mg  5-10 mg Oral Q6H PRN Delle Reining S, PA-C       Or   prochlorperazine (COMPAZINE) suppository 12.5 mg  12.5 mg Rectal Q6H PRN Love, Pamela S, PA-C       Or   prochlorperazine (COMPAZINE) injection 5-10 mg  5-10 mg Intravenous Q6H PRN Love, Pamela S, PA-C       sodium bicarbonate tablet 650 mg  650 mg Oral BID Love, Pamela S, PA-C   650 mg at 04/22/23 0902   sorbitol 70 % solution 30 mL  30 mL Oral Daily PRN Erick Colace, MD       ticagrelor [facility]) tablet 90 mg  90 mg Oral BID Love, Pamela S, PA-C   90 mg at 04/22/23 4782      Review of Systems: 10 systems reviewed and negative except per interval history/subjective  Physical Exam: Vitals:   04/21/23 1752 04/22/23 0226  BP: (!) 147/71 (!) 141/69  Pulse: 79 76  Resp: 18 20  Temp: 98.6 F (37 C) 98 F (36.7 C)  SpO2: 100% 100%   No intake/output data recorded.  Intake/Output Summary (Last 24 hours) at 04/22/2023 1052 Last data filed at 04/21/2023 1722 Gross per 24 hour  Intake 701.37 ml  Output --  Net 701.37 ml   Constitutional: well-appearing, no acute distress ENMT: ears and nose without scars or lesions, MMM CV: normal rate, no edema Respiratory: bilateral chest rise, normal work of breathing Gastrointestinal: soft, non-tender, no palpable masses or hernias Skin: no visible lesions or rashes Psych: alert, judgement/insight appropriate, appropriate mood and affect   Test Results I personally reviewed new and old clinical labs and radiology tests Lab Results  Component Value Date   NA 141 04/22/2023   K 4.3 04/22/2023   CL 113 (H) 04/22/2023   CO2 19 (L) 04/22/2023   BUN 57 (H) 04/22/2023   CREATININE 3.09 (H) 04/22/2023   CALCIUM 8.6 (L) 04/22/2023   ALBUMIN 2.9 (L) 04/22/2023   PHOS 5.1 (H) 04/22/2023    CBC Recent Labs  Lab 04/18/23 0531  WBC 6.0  NEUTROABS 3.6  HGB 7.7*  HCT 23.9*  MCV 88.8   PLT 190

## 2023-04-22 NOTE — Progress Notes (Signed)
Patient had a brown mucus bowel movement twice today. This is new and has not happened to her before.

## 2023-04-22 NOTE — Progress Notes (Signed)
PROGRESS NOTE   Subjective/Complaints:  Appreciate nephro note  ROS :  neg CP, SOB, N/V/D  Objective:   US RENAL  Result Date: 04/21/2023 CLINICAL DATA:  Acute kidney injury. EXAM: RENAL / URINARY TRACT ULTRASOUND COMPLETE COMPARISON:  December 23, 2021 FINDINGS: Right Kidney: Renal measurements: 8.1 cm x 4.0 cm x 4.6 cm = volume: 77 mL. Echogenicity within normal limits. No mass or hydronephrosis visualized. Left Kidney: Renal measurements: 9.0 cm x 5.5 cm x 4.1 cm = volume: 103 mL. Echogenicity within normal limits. No mass or hydronephrosis visualized. Bladder: The urinary bladder is empty and subsequently limited in evaluation. Other: None. IMPRESSION: Findings suggestive of mild interval atrophy of the right kidney since the prior exam. Electronically Signed   By: Aram Candela M.D.   On: 04/21/2023 19:58   No results for input(s): "WBC", "HGB", "HCT", "PLT" in the last 72 hours.  Recent Labs    04/21/23 1111 04/22/23 0457  NA 138 141  K 4.4 4.3  CL 112* 113*  CO2 20* 19*  GLUCOSE 102* 110*  BUN 58* 57*  CREATININE 2.96* 3.09*  CALCIUM 9.4 8.6*    Intake/Output Summary (Last 24 hours) at 04/22/2023 1112 Last data filed at 04/21/2023 1722 Gross per 24 hour  Intake 701.37 ml  Output --  Net 701.37 ml        Physical Exam: Vital Signs Blood pressure (!) 141/69, pulse 76, temperature 98 F (36.7 C), temperature source Oral, resp. rate 20, height 5\' 2"  (1.575 m), weight 60.8 kg, last menstrual period 05/22/2018, SpO2 100%.   General: No acute distress Mood and affect are appropriate Heart: Regular rate and rhythm no rubs murmurs or extra sounds Lungs: Clear to auscultation, breathing unlabored, no rales or wheezes Abdomen: Positive bowel sounds, soft nontender to palpation, nondistended Extremities: No clubbing, cyanosis, or edema Skin: No evidence of breakdown, no evidence of rash Neurologic:  right eye eye  with reduce adduction when attempting left lateral gaze  + left horz  nystagmus , motor strength is 4/5 in bilateral deltoid, bicep, tricep, grip, hip flexor, knee extensors, ankle dorsiflexor and plantar flexor Visual fields intact in all 4 quadrants with confrontation testing   Musculoskeletal: Full range of motion in all 4 extremities. No joint swelling Eyes- no erythema or tearing   Assessment/Plan: 1. Functional deficits which require 3+ hours per day of interdisciplinary therapy in a comprehensive inpatient rehab setting. Physiatrist is providing close team supervision and 24 hour management of active medical problems listed below. Physiatrist and rehab team continue to assess barriers to discharge/monitor patient progress toward functional and medical goals  Care Tool:  Bathing    Body parts bathed by patient: Right arm, Left arm, Chest, Abdomen, Front perineal area, Buttocks, Right upper leg, Face, Left lower leg, Right lower leg, Left upper leg         Bathing assist Assist Level: Set up assist     Upper Body Dressing/Undressing Upper body dressing   What is the patient wearing?: Bra, Pull over shirt    Upper body assist Assist Level: Set up assist    Lower Body Dressing/Undressing Lower body dressing  What is the patient wearing?: Underwear/pull up, Pants     Lower body assist Assist for lower body dressing: Supervision/Verbal cueing     Toileting Toileting    Toileting assist Assist for toileting: Supervision/Verbal cueing     Transfers Chair/bed transfer  Transfers assist     Chair/bed transfer assist level: Contact Guard/Touching assist     Locomotion Ambulation   Ambulation assist      Assist level: Minimal Assistance - Patient > 75% Assistive device: Walker-rolling Max distance: 200 ft   Walk 10 feet activity   Assist     Assist level: Minimal Assistance - Patient > 75% Assistive device: No Device   Walk 50 feet  activity   Assist    Assist level: Minimal Assistance - Patient > 75% Assistive device: Walker-rolling    Walk 150 feet activity   Assist    Assist level: Minimal Assistance - Patient > 75% Assistive device: Walker-rolling    Walk 10 feet on uneven surface  activity   Assist Walk 10 feet on uneven surfaces activity did not occur: Safety/medical concerns         Wheelchair     Assist Is the patient using a wheelchair?: No   Wheelchair activity did not occur: N/A         Wheelchair 50 feet with 2 turns activity    Assist    Wheelchair 50 feet with 2 turns activity did not occur: N/A       Wheelchair 150 feet activity     Assist  Wheelchair 150 feet activity did not occur: N/A       Blood pressure (!) 141/69, pulse 76, temperature 98 F (36.7 C), temperature source Oral, resp. rate 20, height 5\' 2"  (1.575 m), weight 60.8 kg, last menstrual period 05/22/2018, SpO2 100%.  Medical Problem List and Plan: 1. 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  Antithrombotics: -DVT/anticoagulation:  Pharmaceutical: Lovenox             -antiplatelet therapy: ASA/Brilinta.  3. Pain Management: Tylenol for pain prn. 4. Mood/Behavior/Sleep: Melatonin prn.             -antipsychotic agents: LCSW to follow for evaluation and support.  5. Neuropsych/cognition: This patient is capable of making decisions on her own behalf. 6. Skin/Wound Care: Routine pressure relief measures.  7. Fluids/Electrolytes/Nutrition: Monitor I/O. Monitor BMET  8. HTN:  Monitor BP TID. On Coreg and amlodipine.              --Continues to be limited by dizziness--monitor for orthostatic changes.              -pt emphatic was using pill packs to do meds/bubble packs and says was compliant with meds? Vitals:   04/21/23 1752 04/22/23 0226  BP: (!) 147/71 (!) 141/69  Pulse: 79 76  Resp: 18 20  Temp:  98.6 F (37 C) 98 F (36.7 C)  SpO2: 100% 100%  Elevated this morning will continue to monitor 9. T2DM w/retinopathy and neuropathy:  Hgb A1c- 6.1 and well controlled. -- Monitor BS ac/hs and use SSI for elevated BS.  --Was on monjuaro at home CBG (last 3)  No results for input(s): "GLUCAP" in the last 72 hours.  Has not been receiving semaglutide 0.5mg  Butner q wk, will d/c in hospital but can resume after d/c  10. 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 Limits fluids to 64 ounces otherwise on regular diet             --resume Bicarb for NAGMA.     Latest Ref Rng & Units 04/22/2023    4:57 AM 04/21/2023   11:11 AM 04/20/2023    5:04 AM  BMP  Glucose 70 - 99 mg/dL 528  413  244   BUN 6 - 20 mg/dL 57  58  56   Creatinine 0.44 - 1.00 mg/dL 0.10  2.72  5.36   Sodium 135 - 145 mmol/L 141  138  139   Potassium 3.5 - 5.1 mmol/L 4.3  4.4  3.7   Chloride 98 - 111 mmol/L 113  112  114   CO2 22 - 32 mmol/L 19  20  21    Calcium 8.9 - 10.3 mg/dL 8.6  9.4  8.8   Increased BUN and Creat will give IVF and recheck in am not on ACE-I or diuretics , will ask for nephro input  11. OSA: Compliant with CPAP 12. H/o Depression: On Prozac--motivated and remains positive.  13. Constipation: will add miralax. If doesn't go by this evening, will add Sorbitol- scared to push  14. Anemia of chronic disease: Monitor H/H with serial checks, Hgb dropped  15. Leucocytosis: Monitor for fevers and other signs of infection.              --recheck WBC in am.      Latest Ref Rng & Units 04/18/2023    5:31 AM 04/14/2023   11:35 AM 04/13/2023   10:30 PM  CBC  WBC 4.0 - 10.5 K/uL 6.0  10.8  10.3    Hemoglobin 12.0 - 15.0 g/dL 7.7  9.4  9.3   Hematocrit 36.0 - 46.0 % 23.9  28.8  28.8   Platelets 150 - 400 K/uL 190  290  273    ? GI blood loss vs chronic kidney , check stool guaic  16.  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  Occ white light at periphery of vision on right side , suspect retinal issues, sees Dr Valere Dross  from optho  for proliferative retinopathy ( also s/p catarct with IOL) will need f/u after discharge   18.  UTI Keflex was not having dysuria but freq has improved  LOS: 5 days A FACE TO FACE EVALUATION WAS PERFORMED  Erick Colace 04/22/2023, 11:12 AM

## 2023-04-23 DIAGNOSIS — R195 Other fecal abnormalities: Secondary | ICD-10-CM | POA: Diagnosis not present

## 2023-04-23 DIAGNOSIS — I10 Essential (primary) hypertension: Secondary | ICD-10-CM

## 2023-04-23 DIAGNOSIS — D649 Anemia, unspecified: Secondary | ICD-10-CM | POA: Diagnosis not present

## 2023-04-23 DIAGNOSIS — D62 Acute posthemorrhagic anemia: Secondary | ICD-10-CM

## 2023-04-23 DIAGNOSIS — N184 Chronic kidney disease, stage 4 (severe): Secondary | ICD-10-CM | POA: Diagnosis not present

## 2023-04-23 DIAGNOSIS — I63311 Cerebral infarction due to thrombosis of right middle cerebral artery: Secondary | ICD-10-CM | POA: Diagnosis not present

## 2023-04-23 LAB — URINALYSIS, W/ REFLEX TO CULTURE (INFECTION SUSPECTED)
Bilirubin Urine: NEGATIVE
Glucose, UA: NEGATIVE mg/dL
Ketones, ur: NEGATIVE mg/dL
Nitrite: NEGATIVE
Protein, ur: NEGATIVE mg/dL
Specific Gravity, Urine: 1.01 (ref 1.005–1.030)
WBC, UA: >50 WBC/hpf (ref 0–5)
pH: 5 (ref 5.0–8.0)

## 2023-04-23 LAB — RENAL FUNCTION PANEL
Albumin: 3 g/dL — ABNORMAL LOW (ref 3.5–5.0)
Anion gap: 5 (ref 5–15)
BUN: 54 mg/dL — ABNORMAL HIGH (ref 6–20)
CO2: 21 mmol/L — ABNORMAL LOW (ref 22–32)
Calcium: 8.6 mg/dL — ABNORMAL LOW (ref 8.9–10.3)
Chloride: 113 mmol/L — ABNORMAL HIGH (ref 98–111)
Creatinine, Ser: 3.09 mg/dL — ABNORMAL HIGH (ref 0.44–1.00)
GFR, Estimated: 18 mL/min — ABNORMAL LOW (ref >60)
Glucose, Bld: 96 mg/dL (ref 70–99)
Phosphorus: 4.5 mg/dL (ref 2.5–4.6)
Potassium: 3.7 mmol/L (ref 3.5–5.1)
Sodium: 139 mmol/L (ref 135–145)

## 2023-04-23 LAB — CBC
HCT: 22.6 % — ABNORMAL LOW (ref 36.0–46.0)
Hemoglobin: 7.2 g/dL — ABNORMAL LOW (ref 12.0–15.0)
MCH: 28.2 pg (ref 26.0–34.0)
MCHC: 31.9 g/dL (ref 30.0–36.0)
MCV: 88.6 fL (ref 80.0–100.0)
Platelets: 176 10*3/uL (ref 150–400)
RBC: 2.55 MIL/uL — ABNORMAL LOW (ref 3.87–5.11)
RDW: 15.1 % (ref 11.5–15.5)
WBC: 5.7 10*3/uL (ref 4.0–10.5)
nRBC: 0 % (ref 0.0–0.2)

## 2023-04-23 LAB — BASIC METABOLIC PANEL
Anion gap: 8 (ref 5–15)
BUN: 55 mg/dL — ABNORMAL HIGH (ref 6–20)
CO2: 19 mmol/L — ABNORMAL LOW (ref 22–32)
Calcium: 8.6 mg/dL — ABNORMAL LOW (ref 8.9–10.3)
Chloride: 113 mmol/L — ABNORMAL HIGH (ref 98–111)
Creatinine, Ser: 3 mg/dL — ABNORMAL HIGH (ref 0.44–1.00)
GFR, Estimated: 18 mL/min — ABNORMAL LOW (ref >60)
Glucose, Bld: 95 mg/dL (ref 70–99)
Potassium: 3.7 mmol/L (ref 3.5–5.1)
Sodium: 140 mmol/L (ref 135–145)

## 2023-04-23 LAB — OCCULT BLOOD X 1 CARD TO LAB, STOOL: Fecal Occult Bld: NEGATIVE

## 2023-04-23 MED ORDER — SODIUM BICARBONATE 650 MG PO TABS
650.0000 mg | ORAL_TABLET | Freq: Three times a day (TID) | ORAL | Status: DC
  Administered 2023-04-23 – 2023-04-28 (×15): 650 mg via ORAL
  Filled 2023-04-23 (×16): qty 1

## 2023-04-23 MED ORDER — PANTOPRAZOLE SODIUM 40 MG PO TBEC
40.0000 mg | DELAYED_RELEASE_TABLET | Freq: Two times a day (BID) | ORAL | Status: DC
  Administered 2023-04-23 – 2023-04-28 (×10): 40 mg via ORAL
  Filled 2023-04-23 (×10): qty 1

## 2023-04-23 NOTE — Progress Notes (Signed)
Occupational Therapy Session Note  Patient Details  Name: Christina Orozco MRN: [id number] Date of Birth: 01-29-1971  Today's Date: 04/23/2023 OT Individual Time: 3875-6433 OT Individual Time Calculation (min): 55 min    Short Term Goals: Week 1:  OT Short Term Goal 1 (Week 1): Pt will demonstrate improved R FMC to be able to open frozen food containers and place food items in microwave. OT Short Term Goal 2 (Week 1): Pt will be able to ambulated to bathroom with RW with supervision only. OT Short Term Goal 3 (Week 1): Pt will demonstrate her ability to do diplopia visual exercises on her own without cues.  Skilled Therapeutic Interventions/Progress Updates:  Pt greeted supine in bed, pt agreeable to OT intervention.      Transfers/bed mobility: pt completed supine>sit with close supervision, pt completed ambulatory transfer to bathroom with rollator and supervision. Pt completed stand pivot to shower with MIN HHA for + safety. Pt exited shower to toilet seat for dressing with same level of assist.    ADLs:  Grooming: pt stood at sink for oral care with pt leaning on sink, pt reports this is her norm to lean on sink. Education provided on compensatory methods for applying paste to brush d/t visual deficits, I.e using contrast color for toothpaste and using pump bottles to apply paste to brush.  UB dressing:pt donned OH shirt with supervision LB dressing: pt donned pants and brief with supervision  Footwear: pt donned shoes with set- up   Bathing: pt completed bathing both seated and standing with close supervision.  Toileting: pt completed 3/3 toileting tasks with supervision, continent urine/bowel void.    Ended session with pt seated EOB with all needs within reach.  Therapy Documentation Precautions:  Precautions Precautions: Fall Precaution Comments: mild R ataxia Restrictions Weight Bearing Restrictions: No  Pain: No pain   Therapy/Group: Individual Therapy  Pollyann Glen [facility] 04/23/2023, 12:08 PM

## 2023-04-23 NOTE — Progress Notes (Addendum)
Urine specimen collected and sent to lab. Awaiting for patient to have bowel movement to collect hemoccult   Tilden Dome, LPN

## 2023-04-23 NOTE — Plan of Care (Signed)
  Problem: Consults Goal: RH STROKE PATIENT EDUCATION Description: See Patient Education module for education specifics  Outcome: Progressing   Problem: RH BOWEL ELIMINATION Goal: RH STG MANAGE BOWEL WITH ASSISTANCE Description: STG Manage Bowel with toileting Assistance. Outcome: Progressing Goal: RH STG MANAGE BOWEL W/MEDICATION W/ASSISTANCE Description: STG Manage Bowel with Medication with mod I Assistance. Outcome: Progressing   Problem: RH SAFETY Goal: RH STG ADHERE TO SAFETY PRECAUTIONS W/ASSISTANCE/DEVICE Description: STG Adhere to Safety Precautions With cues Assistance/Device. Outcome: Progressing   Problem: RH KNOWLEDGE DEFICIT Goal: RH STG INCREASE KNOWLEDGE OF DIABETES Description: Patient will be able to manage DM with medications and dietary modification using educational resources independently Outcome: Progressing Goal: RH STG INCREASE KNOWLEDGE OF HYPERTENSION Description: Patient will be able to manage HTN with medications and dietary modification using educational resources independently Outcome: Progressing Goal: RH STG INCREASE KNOWLEGDE OF HYPERLIPIDEMIA Description: Patient will be able to manage HLD with medications and dietary modification using educational resources independently Outcome: Progressing Goal: RH STG INCREASE KNOWLEDGE OF STROKE PROPHYLAXIS Description: Patient will be able to manage secondary risks with medications and dietary modification using educational resources independently Outcome: Progressing

## 2023-04-23 NOTE — Progress Notes (Signed)
Occupational Therapy Session Note  Patient Details  Name: Christina Orozco MRN: [id number] Date of Birth: March 09, 1971  Today's Date: 04/23/2023 OT Individual Time: 1000-1100 and [id number] OT Individual Time Calculation (min): 60 min and 28 min   Short Term Goals: Week 1:  OT Short Term Goal 1 (Week 1): Pt will demonstrate improved R FMC to be able to open frozen food containers and place food items in microwave. OT Short Term Goal 2 (Week 1): Pt will be able to ambulated to bathroom with RW with supervision only. OT Short Term Goal 3 (Week 1): Pt will demonstrate her ability to do diplopia visual exercises on her own without cues.  Skilled Therapeutic Interventions/Progress Updates:    Visit 1: Pain: no c/o pain    Pt received in bed ready for therapy.  Focus of therapy session on activities to improve functional use of eyes.      Therapeutic Activity/ Exercise: -visual perception with pipe tree design with only needing 1-2 min cues -brock string - pt continues to have diplopia when focusing on far bead but can isolate 2nd bead, she found the activity more fatiguing today as she moved eyes back and forth -pt states she continues to have intermittent "white flashes" in her eyes  -saccades using bioness, pt able to complete well  -tracing activity with bioness using R hand with stylus -pt continues to find occlusion glasses helping to remediate her diplopia   Transfers: -supervision with rollator ambulating room to gyms with a few seated rest breaks  Pt resting in bed with all needs met. Alarm set and call light in reach.     Visit 2:  Pain: no pain  Pt received in bed and needed to toilet.  Mod I with toileting, distant S with ambulation to bathroom with 4WW Pt ambulated to day room gym to work on dynamic balance activity in standing with reaching behind her and to the right to pick up heavy balls with R hand and place them in basket  in front of her. Pt able to lift up to 7  lbs.  She then held 7 lb weight to work on lateral side bends for core strength and bicep curls but unable to curl without hyperextending her back.  She can lift it to ensure she could lift a gallon of milk. Pt said she uses lactaid in half gallons.   Pt ambulated back to room and opted to get in bed.   Pt in bed with all needs met, alarm set.   Therapy Documentation Precautions:  Precautions Precautions: Fall Precaution Comments: mild R ataxia Restrictions Weight Bearing Restrictions: No    Vital Signs: Therapy Vitals Pulse Rate: 72 BP: (!) 153/80 Patient Position (if appropriate): Lying    ADL: ADL ADL Comments: close supervision with all tasks of self care, CGA with ambulation with RW    Therapy/Group: Individual Therapy  Christina Orozco 04/23/2023, 9:22 AM

## 2023-04-23 NOTE — Discharge Instructions (Addendum)
Inpatient Rehab Discharge Instructions  Christina Orozco Discharge date and time:  04/28/23  Activities/Precautions/ Functional Status: Activity: no lifting, driving, or strenuous exercise till cleared by MD Diet: cardiac diet and diabetic diet Wound Care: none needed   Functional status:  ___ No restrictions     ___ Walk up steps independently ___ 24/7 supervision/assistance   ___ Walk up steps with assistance ___ Intermittent supervision/assistance  ___ Bathe/dress independently ___ Walk with walker     ___ Bathe/dress with assistance ___ Walk Independently    ___ Shower independently ___ Walk with assistance    ___ Shower with assistance _X__ No alcohol     ___ Return to work/school ________   COMMUNITY REFERRALS UPON DISCHARGE:    Home Health:   PT     OT                    Agency: [facility] Phone: [phone]   Special Instructions:  STROKE/TIA DISCHARGE INSTRUCTIONS SMOKING Cigarette smoking nearly doubles your risk of having a stroke & is the single most alterable risk factor  If you smoke or have smoked in the last 12 months, you are advised to quit smoking for your health. Most of the excess cardiovascular risk related to smoking disappears within a year of stopping. Ask you doctor about anti-smoking medications [place] Quit Line: 1-800-QUIT NOW Free Smoking Cessation Classes [phone]  CHOLESTEROL Know your levels; limit fat & cholesterol in your diet  Lipid Panel     Component Value Date/Time   CHOL 126 04/14/2023 1135   CHOL 135 06/17/2020 1053   TRIG 34 04/14/2023 1135   HDL 76 04/14/2023 1135   HDL 47 06/17/2020 1053   CHOLHDL 1.7 04/14/2023 1135   VLDL 7 04/14/2023 1135   LDLCALC 43 04/14/2023 1135   LDLCALC 63 06/17/2020 1053     Many patients benefit from treatment even if their cholesterol is at goal. Goal: Total Cholesterol (CHOL) less than 160 Goal:  Triglycerides (TRIG) less than 150 Goal:  HDL greater than 40 Goal:  LDL (LDLCALC) less than  100   BLOOD PRESSURE American Stroke Association blood pressure target is less that 120/80 mm/Hg  Your discharge blood pressure is:  BP: 134/67 Monitor your blood pressure Limit your salt and alcohol intake Many individuals will require more than one medication for high blood pressure  DIABETES (A1c is a blood sugar average for last 3 months) Goal HGBA1c is under 7% (HBGA1c is blood sugar average for last 3 months)  Diabetes:     Lab Results  Component Value Date   HGBA1C 6.1 (H) 04/14/2023    Your HGBA1c can be lowered with medications, healthy diet, and exercise. Check your blood sugar as directed by your physician Call your physician if you experience unexplained or low blood sugars.  PHYSICAL ACTIVITY/REHABILITATION Goal is 30 minutes at least 4 days per week  Activity: Increase activity slowly, and No driving, Therapies: see above Return to work: N/A Activity decreases your risk of heart attack and stroke and makes your heart stronger.  It helps control your weight and blood pressure; helps you relax and can improve your mood. Participate in a regular exercise program. Talk with your doctor about the best form of exercise for you (dancing, walking, swimming, cycling).  DIET/WEIGHT Goal is to maintain a healthy weight  Your discharge diet is:  Diet Order             Diet Carb Modified Fluid consistency: Thin; Room  service appropriate? Yes with Assist  Diet effective now                   liquids Your height is:  Height: 5\' 2"  (157.5 cm) Your current weight is: Weight: 61.4 kg Your Body Mass Index (BMI) is:  BMI (Calculated): 24.75 Following the type of diet specifically designed for you will help prevent another stroke. You are at goal weight   Your goal Body Mass Index (BMI) is 19-24. Healthy food habits can help reduce 3 risk factors for stroke:  High cholesterol, hypertension, and excess weight.  RESOURCES Stroke/Support Group:  Call [phone]   STROKE EDUCATION  PROVIDED/REVIEWED AND GIVEN TO PATIENT Stroke warning signs and symptoms How to activate emergency medical system (call 911). Medications prescribed at discharge. Need for follow-up after discharge. Personal risk factors for stroke. Pneumonia vaccine given:  Flu vaccine given:  My questions have been answered, the writing is legible, and I understand these instructions.  I will adhere to these goals & educational materials that have been provided to me after my discharge from the hospital.     My questions have been answered and I understand these instructions. I will adhere to these goals and the provided educational materials after my discharge from the hospital.  Patient/Caregiver Signature _______________________________ Date __________  Clinician Signature _______________________________________ Date __________  Please bring this form and your medication list with you to all your follow-up doctor's appointments.

## 2023-04-23 NOTE — Progress Notes (Signed)
Stool sample collected for hemoccult per MD order. Specimen sent to lab.    Tilden Dome, LPN

## 2023-04-23 NOTE — Consult Note (Signed)
Consultation Note   Referring Provider:  Inpatient Rehab - Faith Rogue, MD   PCP: [facility] Primary Gastroenterologist:    Gentry Fitz   Reason for Consultation: Anemia , FOBT+  DOA: 04/17/2023         Hospital Day: 7   ASSESSMENT    Brief Narrative:  52 y.o. 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 Admited 04/13/23 with an acute ischemic CVA.   Chronic anemia, likely due in part to CKD. Baseline hgb mid 9 to mid 10 range  Acute worsening of chronic anemia / FOBT+ in setting of lovenox this admission in addition to chronic  Brillinta and baby asa. Hgb in 7 range.  Presence of any overt GI bleeding unknown ( see doesn't examine stool). Rule out occult GI bleeding from intestinal AVMs, erosive disease, PUD. Neoplasm seems less likely. Ferritin 82 / low TIBC so not definitely IDA but treated with 2 doses of IV Fe this admission.   Recurrent ischemic CVA, wasn't tPA candidate. On dual antiplatelet therapy at home.   Chronic GERD. Asymptomatic on daily PPI  Prolonged QT interval  Diabetes.  On Ozempic at home  Principal Problem:   Stroke (cerebrum) [facility])   PLAN:   --Ideally patient would get EGD / colonoscopy to evaluate worsening anemia / +FOBT. However  she is at increased risk with recent CVA. Additionally it is likely too risky to hold antiplatelet therapy.  Will discuss with Dr. Tomasa Rand to see if willing to do both procedures on Brillinta vrs just doing an EGD on Brillinta followed by a CT scan to evaluate for any gross lower GI tract lesions.  --Continue Pantoprazole for GERD. Currently taking BID in setting of FOBT+ stools on Asa and Brillinta    HPI   Aurielle gives a lifelong history of anemia which she says dates back to childhood.  Based on labs in epic her hemoglobin at baseline is generally mid 9 to mid 10 range.   She does not look at her bowel movements so is unsure about whether they have contained any blood.  She has not taken oral iron in years due to constipation.  She has never had endoscopic workup of anemia.  Other than chronic GERD she has no GI problems.  GERD nicely controlled on daily pantoprazole. She has a soft bowel movement about every 5 days at home .  This is her normal bowel habit pattern, she does not take anything for constipation  Uncle had colon cancer in his 66s.  Grandmother had pancreatic cancer.  Sister has breast cancer  Notable labs / Imaging / Events this admission  :  Hemoglobin 9.3 on admission  >> 7.7 >> 7.2.  MCV 88 Platelets 176 BUN 54, creatinine 3.09 Albumin 3.0  Labs and Imaging: Recent Labs    04/23/23 0456  WBC 5.7  HGB 7.2*  HCT 22.6*  PLT 176   Recent Labs    04/21/23 1111 04/22/23 0457 04/23/23 0456  NA 138 141 139  140  K 4.4 4.3 3.7  3.7  CL 112* 113* 113*  113*  CO2 20* 19* 21*  19*  GLUCOSE 102* 110* 96  95  BUN 58* 57* 54*  55*  CREATININE 2.96* 3.09* 3.09*  3.00*  CALCIUM 9.4 8.6* 8.6*  8.6*   Recent Labs    04/23/23 0456  ALBUMIN 3.0*   No results for input(s): "HEPBSAG", "HCVAB", "HEPAIGM", "HEPBIGM" in the last 72 hours. No results for input(s): "LABPROT", "INR" in the last 72 hours.    Past Medical History:  Diagnosis Date   Anemia    severe   Anxiety    Asthma    CKD (chronic kidney disease)    stage IV   DDD (degenerative disc disease), cervical    Depression    Diabetes mellitus (HCC)    Dislocated shoulder    right   GERD (gastroesophageal reflux disease)    Headache    migraines (about once a month)   History of kidney stones    Hypertension    Neuropathy    Peripheral vascular disease (HCC)    Pneumonia    Stroke [facility])     Past Surgical History:  Procedure Laterality Date   ADENOIDECTOMY     APPENDECTOMY     CERVICAL SPINE SURGERY  06/2012   C5-C6   EYE SURGERY     left eye surgery     FOOT SURGERY     KENALOG INJECTION Left 03/10/2021   Procedure: Intravitreal Avastin Injection;  Surgeon: Stephannie Li, MD;  Location: [facility] OR;  Service: Ophthalmology;  Laterality: Left;   LOOP RECORDER INSERTION N/A 08/27/2017   Procedure: LOOP RECORDER INSERTION;  Surgeon: Thurmon Fair, MD;  Location: [facility] INVASIVE CV LAB;  Service: Cardiovascular;  Laterality: N/A;   MEMBRANE PEEL Left 03/10/2021   Procedure: MEMBRANE PEEL;  Surgeon: Stephannie Li, MD;  Location[facility] OR;  Service: Ophthalmology;  Laterality: Left;   PARS PLANA VITRECTOMY Left 03/10/2021   Procedure: TWENTY-FIVE GAUGE PARS PLANA VITRECTOMY;  Surgeon: Stephannie Li, MD;  Location: [facility] OR;  Service: Ophthalmology;  Laterality: Left;   PHOTOCOAGULATION WITH LASER Left 03/10/2021   Procedure: PHOTOCOAGULATION WITH LASER;  Surgeon: Stephannie Li, MD;  Location: [facility] OR;  Service: Ophthalmology;  Laterality: Left;   SHOULDER SURGERY     TEE WITHOUT CARDIOVERSION N/A 08/27/2017   Procedure: TRANSESOPHAGEAL ECHOCARDIOGRAM (TEE);  Surgeon: Thurmon Fair, MD;  Location: [facility] ENDOSCOPY;  Service: Cardiovascular;  Laterality: N/A;   TONSILLECTOMY      Family History  Problem Relation Age of Onset   Vascular Disease Mother    CAD Mother    Heart failure Mother    Heart disease Other    Cancer Other        colon, stomach, pancreatic, lung   Diabetes Other    Breast cancer Sister 30   Seizures Maternal Uncle     Prior to Admission medications   Medication Sig Start Date End Date Taking? Authorizing Provider  albuterol (PROAIR HFA) 108 (90 Base) MCG/ACT inhaler INHALE 1 OR 2 PUFFS INTO THE LUNGS EVERY 6 HOURS AS NEEDED FOR WHEEZING OR SHORTNESS OF BREATH 04/12/18   Angiulli, Mcarthur Rossetti, PA-C  amLODipine (NORVASC) 10 MG tablet TAKE 1 TABLET (10 MG TOTAL) BY MOUTH DAILY. (AM) 03/05/23   Alver Sorrow, NP  aspirin EC 81 MG tablet Take 81 mg by mouth 2 (two) times daily. Swallow whole.    [provider]  atorvastatin (LIPITOR)  80 MG tablet Take 1 tablet (80 mg total) by mouth at bedtime. 10/05/22   Alver Sorrow, NP  carvedilol (COREG) 12.5 MG tablet Take 1 tablet (12.5 mg total) by mouth  2 (two) times daily with a meal. 10/12/22   Alver Sorrow, NP  FLUoxetine (PROZAC) 20 MG capsule Take 20 mg by mouth daily.    [provider]  fluticasone (FLONASE) 50 MCG/ACT nasal spray Place 2 sprays into both nostrils daily. 05/01/22   [provider]  montelukast (SINGULAIR) 10 MG tablet Take 10 mg by mouth at bedtime.    [provider]  OZEMPIC, 0.25 OR 0.5 MG/DOSE, 2 MG/3ML SOPN Inject 0.5 mg into the skin See admin instructions. Inject 0.5 mg subcutaneous every 10 days 04/14/22   [provider]  pantoprazole (PROTONIX) 40 MG tablet Take 1 tablet (40 mg total) by mouth daily. 12/30/21   Love, Evlyn Kanner, PA-C  sodium bicarbonate 650 MG tablet Take 650 mg by mouth 2 (two) times daily. 04/02/23   [provider]  ticagrelor (BRILINTA) 90 MG TABS tablet Take 90 mg by mouth 2 (two) times daily.    [provider]    Current Facility-Administered Medications  Medication Dose Route Frequency Provider Last Rate Last Admin   0.9 %  sodium chloride infusion   Intravenous PRN Kirsteins, Victorino Sparrow, MD 10 mL/hr at 04/22/23 1821 Restarted at 04/22/23 1821   acetaminophen (TYLENOL) tablet 325-650 mg  325-650 mg Oral Q4H PRN Love, Pamela S, PA-C       albuterol (PROVENTIL) (2.5 MG/3ML) 0.083% nebulizer solution 2.5 mg  2.5 mg Nebulization Q4H PRN Love, Pamela S, PA-C       alum & mag hydroxide-simeth (MAALOX/MYLANTA) 200-200-20 MG/5ML suspension 30 mL  30 mL Oral Q4H PRN Love, Pamela S, PA-C       amLODipine (NORVASC) tablet 10 mg  10 mg Oral Daily Delle Reining S, PA-C   10 mg at 04/23/23 6578   aspirin EC tablet 81 mg  81 mg Oral BID Jacquelynn Cree, PA-C   81 mg at 04/23/23 0850   atorvastatin (LIPITOR) tablet 80 mg  80 mg Oral QHS Jacquelynn Cree, PA-C   80 mg at 04/22/23 2045    bisacodyl (DULCOLAX) suppository 10 mg  10 mg Rectal Daily PRN Love, Pamela S, PA-C       carvedilol (COREG) tablet 12.5 mg  12.5 mg Oral BID WC Love, Pamela S, PA-C   12.5 mg at 04/23/23 0850   cephALEXin (KEFLEX) capsule 500 mg  500 mg Oral Daily Darnell Level, MD   500 mg at 04/23/23 4696   diphenhydrAMINE (BENADRYL) capsule 25 mg  25 mg Oral Q8H PRN Love, Pamela S, PA-C       enoxaparin (LOVENOX) injection 30 mg  30 mg Subcutaneous Q24H Pham, Minh Q, RPH-CPP   30 mg at 04/22/23 1419   FLUoxetine (PROZAC) capsule 20 mg  20 mg Oral Daily Jacquelynn Cree, PA-C   20 mg at 04/23/23 0851   guaiFENesin-dextromethorphan (ROBITUSSIN DM) 100-10 MG/5ML syrup 5-10 mL  5-10 mL Oral Q6H PRN Love, Pamela S, PA-C       iron sucrose (VENOFER) 200 mg in sodium chloride 0.9 % 100 mL IVPB  200 mg Intravenous Q24H Darnell Level, MD 440 mL/hr at 04/22/23 1806 200 mg at 04/22/23 1806   melatonin tablet 5 mg  5 mg Oral QHS PRN Love, Pamela S, PA-C       montelukast (SINGULAIR) tablet 10 mg  10 mg Oral QHS Love, Pamela S, PA-C   10 mg at 04/22/23 2045   naphazoline-glycerin (CLEAR EYES REDNESS) ophth solution 1-2 drop  1-2 drop Left  Eye BID Jacquelynn Cree, [place]   2 drop at 04/23/23 0859   pantoprazole (PROTONIX) EC tablet 40 mg  40 mg Oral BID Ranelle Oyster, MD       prochlorperazine (COMPAZINE) tablet 5-10 mg  5-10 mg Oral Q6H PRN Love, Pamela S, PA-C       Or   prochlorperazine (COMPAZINE) suppository 12.5 mg  12.5 mg Rectal Q6H PRN Love, Pamela S, PA-C       Or   prochlorperazine (COMPAZINE) injection 5-10 mg  5-10 mg Intravenous Q6H PRN Love, Pamela S, PA-C       sodium bicarbonate tablet 650 mg  650 mg Oral TID Love, Pamela S, PA-C       sorbitol 70 % solution 30 mL  30 mL Oral Daily PRN Erick Colace, MD       ticagrelor [facility]) tablet 90 mg  90 mg Oral BID Delle Reining S, PA-C   90 mg at 04/23/23 0850    Allergies as of 04/17/2023 - Review Complete 04/17/2023  Allergen Reaction Noted    Lisinopril Swelling and Other (See Comments) 03/23/2016   Penicillins Hives 03/07/2012   Quetiapine Other (See Comments) 01/11/2022   Gabapentin Swelling and Other (See Comments) 07/11/2016    Social History   Socioeconomic History   Marital status: Single    Spouse name: Not on file   Number of children: 3   Years of education: 12+   Highest education level: Some college, no degree  Occupational History   Not on file  Tobacco Use   Smoking status: Never   Smokeless tobacco: Never  Vaping Use   Vaping status: Never Used  Substance and Sexual Activity   Alcohol use: No   Drug use: No   Sexual activity: Not Currently  Other Topics Concern   Not on file  Social History Narrative   Lives with daughter   Caffeine use: "once in a blue moon"   Right handed   Social Determinants of Health   Financial Resource Strain: Medium Risk (09/06/2022)   Received from [facility]   Overall Financial Resource Strain (CARDIA)    Difficulty of Paying Living Expenses: Somewhat hard  Food Insecurity: Low Risk  (02/27/2023)   Received from [facility]   Hunger Vital Sign    Worried About Running Out of Food in the Last Year: Never true    Ran Out of Food in the Last Year: Never true  Transportation Needs: No Transportation Needs (02/27/2023)   Received from [facility]    In the past 12 months, has lack of reliable transportation kept you from medical appointments, meetings, work or from getting things needed for daily living? : No  Physical Activity: Not on file  Stress: Not on file  Social Connections: Unknown (10/10/2021)   Received from [facility], [facility]   Social Network    Social Network: Not on file  Intimate Partner Violence: Not At Risk (10/28/2022)   Humiliation, Afraid, Rape, and Kick questionnaire    Fear of Current or Ex-Partner: No    Emotionally Abused: No    Physically Abused: No    Sexually Abused: No     Code Status    Code Status: Full Code  Review of Systems: All systems reviewed and negative except where noted in HPI.  Physical Exam: Vital signs in last 24 hours: Temp:  [98.5 F (36.9 C)-98.6 F (37 C)] 98.5 F (36.9 C) (11/11 0344)  Pulse Rate:  [72-81] 72 (11/11 0900) Resp:  [18] 18 (11/11 0344) BP: (148-169)/(74-80) 153/80 (11/11 0900) SpO2:  [100 %] 100 % (11/11 0344) Weight:  [61 kg] 61 kg (11/11 0344) Last BM Date : 04/23/23  General:  Pleasant female in NAD Psych:  Cooperative. Normal mood and affect Eyes: Pupils equal Ears:  Normal auditory acuity Nose: No deformity, discharge or lesions Neck:  Supple, no masses felt Lungs:  Clear to auscultation.  Heart:  Regular rate, regular rhythm.  Abdomen:  Soft, nondistended, nontender, active bowel sounds, no masses felt Rectal :  Deferred Msk: Symmetrical without gross deformities.  Neurologic:  Alert, oriented, grossly normal neurologically Extremities : No edema Skin:  Intact without significant lesions.    Intake/Output from previous day: 11/10 0701 - 11/11 0700 In: 723.2 [P.O.:590; I.V.:23.2; IV Piggyback:110] Out: -  Intake/Output this shift:  No intake/output data recorded.   Willette Cluster, NP-C   04/23/2023, 11:01 AM

## 2023-04-23 NOTE — Progress Notes (Signed)
PROGRESS NOTE   Subjective/Complaints:  Pt with 2 mucousy stools, slightly brown. Had 3 stools of varying qualities prior. Asked why she was having the mucous. Says she moves bowels q5 days at home. Denies any gross blood in stool. No abdominal pain, cramping. Has hx of GERD.   ROS: Patient denies fever, rash, sore throat, blurred vision, dizziness, nausea, vomiting,  cough, shortness of breath or chest pain, joint or back/neck pain, headache, or mood change.   Objective:   US RENAL  Result Date: 04/21/2023 CLINICAL DATA:  Acute kidney injury. EXAM: RENAL / URINARY TRACT ULTRASOUND COMPLETE COMPARISON:  December 23, 2021 FINDINGS: Right Kidney: Renal measurements: 8.1 cm x 4.0 cm x 4.6 cm = volume: 77 mL. Echogenicity within normal limits. No mass or hydronephrosis visualized. Left Kidney: Renal measurements: 9.0 cm x 5.5 cm x 4.1 cm = volume: 103 mL. Echogenicity within normal limits. No mass or hydronephrosis visualized. Bladder: The urinary bladder is empty and subsequently limited in evaluation. Other: None. IMPRESSION: Findings suggestive of mild interval atrophy of the right kidney since the prior exam. Electronically Signed   By: Aram Candela M.D.   On: 04/21/2023 19:58   Recent Labs    04/23/23 0456  WBC 5.7  HGB 7.2*  HCT 22.6*  PLT 176    Recent Labs    04/22/23 0457 04/23/23 0456  NA 141 139  140  K 4.3 3.7  3.7  CL 113* 113*  113*  CO2 19* 21*  19*  GLUCOSE 110* 96  95  BUN 57* 54*  55*  CREATININE 3.09* 3.09*  3.00*  CALCIUM 8.6* 8.6*  8.6*    Intake/Output Summary (Last 24 hours) at 04/23/2023 0843 Last data filed at 04/23/2023 0435 Gross per 24 hour  Intake 487.21 ml  Output --  Net 487.21 ml        Physical Exam: Vital Signs Blood pressure (!) 148/78, pulse 75, temperature 98.5 F (36.9 C), temperature source Oral, resp. rate 18, height 5\' 2"  (1.575 m), weight 61 kg, last menstrual  period 05/22/2018, SpO2 100%.   Constitutional: No distress . Vital signs reviewed. HEENT: NCAT, EOMI, oral membranes moist Neck: supple Cardiovascular: RRR without murmur. No JVD    Respiratory/Chest: CTA Bilaterally without wheezes or rales. Normal effort    GI/Abdomen: BS +, non-tender, non-distended Ext: no clubbing, cyanosis, or edema Psych: pleasant and cooperative  Skin: No evidence of breakdown, no evidence of rash Neurologic:  right eye eye with reduce adduction when attempting left lateral gaze  + left horz  nystagmus , motor strength is 4/5 in bilateral deltoid, bicep, tricep, grip, hip flexor, knee extensors, ankle dorsiflexor and plantar flexor Visual fields intact in all 4 quadrants with confrontation testing--no changes in neuro exam 11/11  Musculoskeletal: Full range of motion in all 4 extremities. No joint swelling    Assessment/Plan: 1. Functional deficits which require 3+ hours per day of interdisciplinary therapy in a comprehensive inpatient rehab setting. Physiatrist is providing close team supervision and 24 hour management of active medical problems listed below. Physiatrist and rehab team continue to assess barriers to discharge/monitor patient progress toward functional and medical goals  Care Tool:  Bathing    Body parts bathed by patient: Right arm, Left arm, Chest, Abdomen, Front perineal area, Buttocks, Right upper leg, Face, Left lower leg, Right lower leg, Left upper leg         Bathing assist Assist Level: Set up assist     Upper Body Dressing/Undressing Upper body dressing   What is the patient wearing?: Bra, Pull over shirt    Upper body assist Assist Level: Set up assist    Lower Body Dressing/Undressing Lower body dressing      What is the patient wearing?: Underwear/pull up, Pants     Lower body assist Assist for lower body dressing: Supervision/Verbal cueing     Toileting Toileting    Toileting assist Assist for toileting:  Supervision/Verbal cueing     Transfers Chair/bed transfer  Transfers assist     Chair/bed transfer assist level: Contact Guard/Touching assist     Locomotion Ambulation   Ambulation assist      Assist level: Minimal Assistance - Patient > 75% Assistive device: Walker-rolling Max distance: 200 ft   Walk 10 feet activity   Assist     Assist level: Minimal Assistance - Patient > 75% Assistive device: No Device   Walk 50 feet activity   Assist    Assist level: Minimal Assistance - Patient > 75% Assistive device: Walker-rolling    Walk 150 feet activity   Assist    Assist level: Minimal Assistance - Patient > 75% Assistive device: Walker-rolling    Walk 10 feet on uneven surface  activity   Assist Walk 10 feet on uneven surfaces activity did not occur: Safety/medical concerns         Wheelchair     Assist Is the patient using a wheelchair?: No   Wheelchair activity did not occur: N/A         Wheelchair 50 feet with 2 turns activity    Assist    Wheelchair 50 feet with 2 turns activity did not occur: N/A       Wheelchair 150 feet activity     Assist  Wheelchair 150 feet activity did not occur: N/A       Blood pressure (!) 148/78, pulse 75, temperature 98.5 F (36.9 C), temperature source Oral, resp. rate 18, height 5\' 2"  (1.575 m), weight 61 kg, last menstrual period 05/22/2018, SpO2 100%.  Medical Problem List and Plan: 1. 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   Antithrombotics: -DVT/anticoagulation:  Pharmaceutical: Lovenox             -antiplatelet therapy: ASA/Brilinta.  3. Pain Management: Tylenol for pain prn. 4. Mood/Behavior/Sleep: Melatonin prn.             -antipsychotic agents: LCSW to follow for evaluation and support.  5. Neuropsych/cognition: This patient is capable of making decisions on her own behalf.  6. Skin/Wound Care: Routine pressure relief measures.  7. Fluids/Electrolytes/Nutrition: Monitor I/O. Monitor BMET  8. HTN: Monitor BP TID. On Coreg and amlodipine.              --Continues to be limited by dizziness--monitor for orthostatic changes.              -pt emphatic was using pill packs to do meds/bubble packs and says was compliant with meds? Vitals:   04/22/23 1911 04/23/23 0344  BP: (!) 169/74 (!) 148/78  Pulse: 81 75  Resp: 18 18  Temp: 98.5 F (36.9 C) 98.5 F (36.9 C)  SpO2: 100% 100%  11/11 bp with improvement this am. No changes to regimen 9. T2DM w/retinopathy and neuropathy:  Hgb A1c- 6.1 and well controlled. -- Monitor BS ac/hs and use SSI for elevated BS.  --Was on monjuaro at home CBG (last 3)  No results for input(s): "GLUCAP" in the last 72 hours.  Has not been receiving semaglutide 0.5mg  [place] q wk, will d/c in hospital but can resume after d/c  10. 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 Limits fluids to 64 ounces otherwise on regular diet             --resume Bicarb for NAGMA.     Latest Ref Rng & Units 04/23/2023    4:56 AM 04/22/2023    4:57 AM 04/21/2023   11:11 AM  BMP  Glucose 70 - 99 mg/dL 70 - 99 mg/dL 95    96  295  621   BUN 6 - 20 mg/dL 6 - 20 mg/dL 55    54  57  58   Creatinine 0.44 - 1.00 mg/dL 3.08 - 6.57 mg/dL 8.46    9.62  9.52  8.41   Sodium 135 - 145 mmol/L 135 - 145 mmol/L 140    139  141  138   Potassium 3.5 - 5.1 mmol/L 3.5 - 5.1 mmol/L 3.7    3.7  4.3  4.4   Chloride 98 - 111 mmol/L 98 - 111 mmol/L 113    113  113  112   CO2 22 - 32 mmol/L 22 - 32 mmol/L 19    21  19  20     Calcium 8.9 - 10.3 mg/dL 8.9 - 32.4 mg/dL 8.6    8.6  8.6  9.4   11/11 no significant change in BUN/Cr. Nephro feels this is close to likely baseline.   -encourage fluids  -rx UTI with keflex  11. OSA: Compliant with CPAP 12. H/o Depression: On Prozac--motivated and remains positive.  13. Constipation: will add miralax. If doesn't go by this evening, will add Sorbitol- scared to push  14. Anemia of chronic disease: Monitor H/H with serial checks, Hgb dropped   11/11 Likely acute on chronic component with further drop to 7.2 today   -one of two stool samples guaiac +    -multiple stools yesterday with mucous likely remnants of her emptying out   -iron infusion x 5 per nephrology   -will ask GI for recs, only has hx of GERD   -check another stool for occult blood as available   -increase protonix to bid   -recheck cbc in am 15. Leucocytosis: Monitor for fevers and other signs of infection.              --recheck WBC in am.      Latest Ref Rng & Units 04/23/2023    4:56 AM 04/18/2023    5:31  AM 04/14/2023   11:35 AM  CBC  WBC 4.0 - 10.5 K/uL 5.7  6.0  10.8   Hemoglobin 12.0 - 15.0 g/dL 7.2  7.7  9.4   Hematocrit 36.0 - 46.0 % 22.6  23.9  28.8   Platelets 150 - 400 K/uL 176  190  290      16.  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  Occ white light at periphery of vision on right side , suspect retinal issues, sees Dr Valere Dross  from optho  for proliferative retinopathy ( also s/p catarct with IOL) will need f/u after discharge   18.  UTI: Keflex was not having dysuria but freq has improved  LOS: 6 days A FACE TO FACE EVALUATION WAS PERFORMED  Ranelle Oyster 04/23/2023, 8:43 AM

## 2023-04-24 ENCOUNTER — Encounter: Payer: 59 | Admitting: Occupational Therapy

## 2023-04-24 ENCOUNTER — Other Ambulatory Visit: Payer: Self-pay | Admitting: Nurse Practitioner

## 2023-04-24 DIAGNOSIS — I63311 Cerebral infarction due to thrombosis of right middle cerebral artery: Secondary | ICD-10-CM | POA: Diagnosis not present

## 2023-04-24 DIAGNOSIS — D649 Anemia, unspecified: Secondary | ICD-10-CM | POA: Diagnosis not present

## 2023-04-24 DIAGNOSIS — R195 Other fecal abnormalities: Secondary | ICD-10-CM

## 2023-04-24 LAB — CBC
HCT: 21.9 % — ABNORMAL LOW (ref 36.0–46.0)
Hemoglobin: 7.1 g/dL — ABNORMAL LOW (ref 12.0–15.0)
MCH: 28.9 pg (ref 26.0–34.0)
MCHC: 32.4 g/dL (ref 30.0–36.0)
MCV: 89 fL (ref 80.0–100.0)
Platelets: 166 10*3/uL (ref 150–400)
RBC: 2.46 MIL/uL — ABNORMAL LOW (ref 3.87–5.11)
RDW: 15.3 % (ref 11.5–15.5)
WBC: 6.4 10*3/uL (ref 4.0–10.5)
nRBC: 0 % (ref 0.0–0.2)

## 2023-04-24 LAB — RENAL FUNCTION PANEL
Albumin: 2.9 g/dL — ABNORMAL LOW (ref 3.5–5.0)
Anion gap: 8 (ref 5–15)
BUN: 54 mg/dL — ABNORMAL HIGH (ref 6–20)
CO2: 21 mmol/L — ABNORMAL LOW (ref 22–32)
Calcium: 8.9 mg/dL (ref 8.9–10.3)
Chloride: 114 mmol/L — ABNORMAL HIGH (ref 98–111)
Creatinine, Ser: 3.11 mg/dL — ABNORMAL HIGH (ref 0.44–1.00)
GFR, Estimated: 18 mL/min — ABNORMAL LOW (ref >60)
Glucose, Bld: 103 mg/dL — ABNORMAL HIGH (ref 70–99)
Phosphorus: 4.5 mg/dL (ref 2.5–4.6)
Potassium: 4.1 mmol/L (ref 3.5–5.1)
Sodium: 143 mmol/L (ref 135–145)

## 2023-04-24 MED ORDER — NAPHAZOLINE-GLYCERIN 0.012-0.25 % OP SOLN: 1.0000 [drp] | Freq: Two times a day (BID) | OPHTHALMIC | Status: AC

## 2023-04-24 NOTE — Progress Notes (Signed)
Physical Therapy Session Note  Patient Details  Name: Christina Orozco MRN: [id number] Date of Birth: 1970-12-19  Today's Date: 04/24/2023 PT Individual Time:  -      Short Term Goals: Week 1:  PT Short Term Goal 1 (Week 1): Pt will perform standing transfers with overall supervision. PT Short Term Goal 2 (Week 1): Pt will ambulate at least 200 ft with rollator and supervision. PT Short Term Goal 3 (Week 1): Pt will demonstrate MCID improvement in outcome measure. PT Short Term Goal 4 (Week 1): Pt will demonstrate improvement in maintaining path with minimal deviation during ambulation using rollator.   Skilled Therapeutic Interventions/Progress Updates:  Patient supine in bed on entrance to room. Patient alert and agreeable to PT session.   Patient with no pain complaint at start of session. Is able to manage donning shoes with setup.   Therapeutic Activity: Bed Mobility: Pt performed supine <> sit with Mod I. Transfers: Pt performed sit<>stand and stand pivot transfers throughout session with overall supervision but does complete slowly. Provided min verbal cues for positioning.  Gait Training/ NMR:  Pt ambulated to/ from room and day room using rollator with close supervision. Demonstrated good step progression with minimal cueing for environmental scan of hallway and busy day room. Provided minimal vc for obstacle clearance.  Pt guided in scan of day room for weighted balls placed from ankle height to overhead height about day room. Requires min assist with visual scan of room and is able to approach all safely and retrieve with close supervision overall.   NMR performed for improvements in motor control and coordination, balance, sequencing, judgement, and self confidence/ efficacy in performing all aspects of mobility at highest level of independence.   Patient supine in bed at end of session with brakes locked, bed alarm set, and all needs within reach.   Therapy  Documentation Precautions:  Precautions Precautions: Fall Precaution Comments: mild R ataxia Restrictions Weight Bearing Restrictions: No  Pain:  No pain related this session.   Therapy/Group: Individual Therapy  Loel Dubonnet PT, DPT, CSRS 04/23/2023, 5:37 PM

## 2023-04-24 NOTE — Progress Notes (Signed)
Occupational Therapy Session Note  Patient Details  Name: Christina Orozco MRN: [id number] Date of Birth: 03-19-1971  Today's Date: 04/24/2023 OT Individual Time: 4098-1191 OT Individual Time Calculation (min): 60 min    Short Term Goals: Week 1:  OT Short Term Goal 1 (Week 1): Pt will demonstrate improved R FMC to be able to open frozen food containers and place food items in microwave. OT Short Term Goal 2 (Week 1): Pt will be able to ambulated to bathroom with RW with supervision only. OT Short Term Goal 3 (Week 1): Pt will demonstrate her ability to do diplopia visual exercises on her own without cues.  Skilled Therapeutic Interventions/Progress Updates:     Pt received in bed ready for therapy.  Focus of therapy session on R [facility] and visual convergence skills.  Pt ambulated to gym with rollator with supervision.        Therapeutic Activity/ Exercise/Neuromuscular Re-Education: -pt worked on a [facility] activity of placing small pins in peg board to a corresponding pattern. Pt able to complete 2 patterns without error although she stated her color discrimination between white and yellow were challenged. She placed pins in with R hand and then removed them one by one with tweezers.  -pt able to see small pins and patterns well using functional eye convergence -R hand and finger strengthening with medium level theraputty with grasp, tip pinch, finger extension, and isometric finger extension. Pt provided with putty to use in her room.     Pt ambulated back to room, she declined need for restroom.  Pt resting in bed with all needs met. Alarm set and call light in reach.     Therapy Documentation Precautions:  Precautions Precautions: Fall Precaution Comments: mild R ataxia Restrictions Weight Bearing Restrictions: No    Vital Signs: Therapy Vitals Temp: 98.2 F (36.8 C) Pulse Rate: 81 Resp: 18 BP: (!) 159/77 Patient Position (if appropriate): Lying Oxygen Therapy SpO2: 100  % O2 Device: Room Air Pain: Pain Assessment Pain Scale: 0-10 Pain Score: 0-No pain ADL: ADL ADL Comments: close supervision with all tasks of self care, CGA with ambulation with RW   Therapy/Group: Individual Therapy  Ashanti Ratti 04/24/2023, 10:47 AM

## 2023-04-24 NOTE — Progress Notes (Signed)
Daily Progress Note  DOA: 04/17/2023 Hospital Day: 8  Chief Complaint: Anemia, Hemoccult positive stool  ASSESSMENT    Brief Narrative:  Angeliah Orozco is a 52 y.o. 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 Admited 04/13/23 with an acute ischemic CVA .  GI saw in consultation 11/11 for evaluation of anemia and Hemoccult positive stool  Chronic anemia, likely due in part to CKD. Baseline hgb mid 9 to mid 10 range   Acute worsening of chronic anemia / FOBT+ in setting of lovenox this admission in addition to chronic  Brillinta and baby asa. Hgb in 7 range.  Presence of any overt GI bleeding unknown ( see doesn't examine stool). Rule out occult GI bleeding from intestinal AVMs, erosive disease, PUD. Neoplasm seems less likely. Ferritin 82 / low TIBC so not definitely IDA but treated with 2 doses of IV Fe this admission.  Today:  Hgb low but stable overnight ( 7.2 >> 7.1). No overt GI blood loss    Recurrent ischemic CVA, wasn't tPA candidate. On dual antiplatelet therapy at home.    Chronic GERD. Asymptomatic on daily PPI   Prolonged QT interval   Diabetes.  On Ozempic at home  Principal Problem:   Stroke (cerebrum) (HCC) Active Problems:   Acute on chronic anemia   Chronic kidney disease (CKD), stage IV (severe) (HCC)   Fecal occult blood test positive   PLAN   --Patient warrants both an upper and lower endoscopy but at elevated risk for sedation in setting of a recent stroke.   --Continue BID PPI --Follow up with Dr. Tomasa Rand in office on 06/14/23 at 1:30 pm. At that time we will decide on course of action to evaluate anemia / FOBT+.  --In the interim she needs to have follow up CBC in ~ 10 days. Will place an order to have done at our office in case she gets discharged  Subjective   No overt GI blood loss ( she is now examining stools). Feels fine today   Objective     Recent Labs    04/23/23 0456 04/24/23 0502  WBC 5.7 6.4  HGB 7.2* 7.1*  HCT 22.6* 21.9*  PLT 176 166   BMET Recent Labs    04/22/23 0457 04/23/23 0456 04/24/23 0502  NA 141 139  140 143  K 4.3 3.7  3.7 4.1  CL 113* 113*  113* 114*  CO2 19* 21*  19* 21*  GLUCOSE 110* 96  95 103*  BUN 57* 54*  55* 54*  CREATININE 3.09* 3.09*  3.00* 3.11*  CALCIUM 8.6* 8.6*  8.6* 8.9   LFT Recent Labs    04/24/23 0502  ALBUMIN 2.9*   PT/INR No results for input(s): "LABPROT", "INR" in the last 72 hours.   Imaging:  US RENAL CLINICAL DATA:  Acute kidney injury.  EXAM: RENAL / URINARY TRACT ULTRASOUND COMPLETE  COMPARISON:  December 23, 2021  FINDINGS: Right Kidney:  Renal measurements: 8.1 cm x 4.0 cm x 4.6 cm = volume: 77 mL. Echogenicity within normal limits. No mass or hydronephrosis visualized.  Left Kidney:  Renal measurements: 9.0 cm x 5.5 cm x 4.1 cm = volume: 103 mL. Echogenicity within normal limits. No mass or hydronephrosis visualized.  Bladder:  The urinary bladder is empty and subsequently limited in evaluation.  Other:  None.  IMPRESSION: Findings suggestive of mild interval atrophy of the right  kidney since the prior exam.  Electronically Signed   By: Aram Candela M.D.   On: 04/21/2023 19:58    Scheduled inpatient medications:   amLODipine  10 mg Oral Daily   aspirin EC  81 mg Oral BID   atorvastatin  80 mg Oral QHS   carvedilol  12.5 mg Oral BID WC   cephALEXin  500 mg Oral Daily   enoxaparin (LOVENOX) injection  30 mg Subcutaneous Q24H   FLUoxetine  20 mg Oral Daily   montelukast  10 mg Oral QHS   naphazoline-glycerin  1-2 drop Left Eye BID   pantoprazole  40 mg Oral BID   sodium bicarbonate  650 mg Oral TID   ticagrelor  90 mg Oral BID   Continuous inpatient infusions:   iron sucrose 200 mg (04/23/23 1535)   PRN inpatient medications: acetaminophen, albuterol, alum & mag hydroxide-simeth, bisacodyl, diphenhydrAMINE,  guaiFENesin-dextromethorphan, melatonin, prochlorperazine **OR** prochlorperazine **OR** prochlorperazine, sorbitol  Vital signs in last 24 hours: Temp:  [98.2 F (36.8 C)-98.6 F (37 C)] 98.2 F (36.8 C) (11/12 0814) Pulse Rate:  [80-84] 81 (11/12 0814) Resp:  [16-18] 18 (11/12 0814) BP: (136-160)/(69-77) 159/77 (11/12 0814) SpO2:  [100 %] 100 % (11/12 0814) Weight:  [61.4 kg] 61.4 kg (11/12 0430) Last BM Date : 04/23/23  Intake/Output Summary (Last 24 hours) at 04/24/2023 0902 Last data filed at 04/24/2023 0700 Gross per 24 hour  Intake 654 ml  Output --  Net 654 ml    Intake/Output from previous day: 11/11 0701 - 11/12 0700 In: 772 [P.O.:772] Out: -  Intake/Output this shift: No intake/output data recorded.   Physical Exam:  General: Alert female in NAD Heart:  Regular rate and rhythm.  Pulmonary: Normal respiratory effort Abdomen: Soft, nondistended, nontender. Normal bowel sounds. Extremities: No lower extremity edema  Neurologic: Alert and oriented Psych: Pleasant. Cooperative. Insight appears normal.      LOS: 7 days   Willette Cluster ,NP 04/24/2023, 9:02 AM

## 2023-04-24 NOTE — Progress Notes (Signed)
   04/24/23 2106  BiPAP/CPAP/SIPAP  $ Non-Invasive Home Ventilator  Subsequent  BiPAP/CPAP/SIPAP Pt Type Adult  BiPAP/CPAP/SIPAP Resmed  Mask Type Nasal pillows  FiO2 (%) 21 %  Safety Check Completed by RT for Home Unit Yes, issues noted   Pt alseep comfortably on home cpap unit

## 2023-04-24 NOTE — Progress Notes (Signed)
Physical Therapy Session Note  Patient Details  Name: Christina Orozco MRN: [id number] Date of Birth: 08/08/1970  Today's Date: 04/24/2023 PT Individual Time: 3875-6433 PT Individual Time Calculation (min): 73 min   Short Term Goals: Week 1:  PT Short Term Goal 1 (Week 1): Pt will perform standing transfers with overall supervision. PT Short Term Goal 2 (Week 1): Pt will ambulate at least 200 ft with rollator and supervision. PT Short Term Goal 3 (Week 1): Pt will demonstrate MCID improvement in outcome measure. PT Short Term Goal 4 (Week 1): Pt will demonstrate improvement in maintaining path with minimal deviation during ambulation using rollator.  Skilled Therapeutic Interventions/Progress Updates:  Patient supine in bed on entrance to room. CPAP donned. Patient alert and agreeable to PT session.   Patient with no pain complaint at start of session.  Is agreeable to outside session for improved mood, engagement.   Therapeutic Activity: Bed Mobility: Pt performed supine <> sit with supervision/ Mod I. No cueing required. Transfers: Pt performed sit<>stand and stand pivot transfers throughout session with supervision. Provided verbal cues for improved positioning with at least one hand pushing from seated surface. .  Gait Training:  Pt ambulated >800 ft using rollator with close supervision/ CGA in order to reach patio outside of [facility] entrance. Demonstrated slow but consistent pace. Pt determined to ambulate entire distance with no rest breaks. Reminded pt to take break if feeling lightheaded, dizzy, weak/ fatigued. Demos understanding. Only 2 instances of need for vc for pt to avoid obstacle on R side.   Pt also ambulates >200 ft up/ down ramped sidewalk over uneven surface. Completes 4 steps with R HR and CGA.   Provided with seated rest breaks throughout for fatigue in order to continue functional strengthening and activity tolerance training. During breaks discussed home life and  discharge plans. SW provided with name of pt's preferred PCA services. Pt also relates that she will not have a ride home prior to Sat. Her brother is then available to provide transport home.   Patient supine in bed at end of session with brakes locked, bed alarm set, and all needs within reach. Very happy with performance and reaching community distances.    Therapy Documentation Precautions:  Precautions Precautions: Fall Precaution Comments: mild R ataxia Restrictions Weight Bearing Restrictions: No General:   Vital Signs:   Pain:  No pain related this session.   Therapy/Group: Individual Therapy  Loel Dubonnet PT, DPT, CSRS 04/24/2023, 5:44 PM

## 2023-04-24 NOTE — Progress Notes (Signed)
PROGRESS NOTE   Subjective/Complaints:  BM x 3 yesterday   ROS: Patient deniesCP, SOB, N/V/D Objective:   No results found. Recent Labs    04/23/23 0456 04/24/23 0502  WBC 5.7 6.4  HGB 7.2* 7.1*  HCT 22.6* 21.9*  PLT 176 166    Recent Labs    04/23/23 0456 04/24/23 0502  NA 139  140 143  K 3.7  3.7 4.1  CL 113*  113* 114*  CO2 21*  19* 21*  GLUCOSE 96  95 103*  BUN 54*  55* 54*  CREATININE 3.09*  3.00* 3.11*  CALCIUM 8.6*  8.6* 8.9    Intake/Output Summary (Last 24 hours) at 04/24/2023 0908 Last data filed at 04/24/2023 0700 Gross per 24 hour  Intake 654 ml  Output --  Net 654 ml        Physical Exam: Vital Signs Blood pressure (!) 159/77, pulse 81, temperature 98.2 F (36.8 C), resp. rate 18, height 5\' 2"  (1.575 m), weight 61.4 kg, last menstrual period 05/22/2018, SpO2 100%.    General: No acute distress Mood and affect are appropriate Heart: Regular rate and rhythm no rubs murmurs or extra sounds Lungs: Clear to auscultation, breathing unlabored, no rales or wheezes Abdomen: Positive bowel sounds, soft nontender to palpation, nondistended Extremities: No clubbing, cyanosis, or edema  Skin: No evidence of breakdown, no evidence of rash Neurologic:  right eye eye with reduce adduction when attempting left lateral gaze  + left horz  nystagmus , motor strength is 4/5 in bilateral deltoid, bicep, tricep, grip, hip flexor, knee extensors, ankle dorsiflexor and plantar flexor Visual fields intact in all 4 quadrants with confrontation testing--no changes in neuro exam 11/11  Musculoskeletal: Full range of motion in all 4 extremities. No joint swelling    Assessment/Plan: 1. Functional deficits which require 3+ hours per day of interdisciplinary therapy in a comprehensive inpatient rehab setting. Physiatrist is providing close team supervision and 24 hour management of active medical  problems listed below. Physiatrist and rehab team continue to assess barriers to discharge/monitor patient progress toward functional and medical goals  Care Tool:  Bathing    Body parts bathed by patient: Right arm, Left arm, Chest, Abdomen, Front perineal area, Buttocks, Right upper leg, Face, Left lower leg, Right lower leg, Left upper leg         Bathing assist Assist Level: Set up assist     Upper Body Dressing/Undressing Upper body dressing   What is the patient wearing?: Bra, Pull over shirt    Upper body assist Assist Level: Set up assist    Lower Body Dressing/Undressing Lower body dressing      What is the patient wearing?: Underwear/pull up, Pants     Lower body assist Assist for lower body dressing: Supervision/Verbal cueing     Toileting Toileting    Toileting assist Assist for toileting: Supervision/Verbal cueing     Transfers Chair/bed transfer  Transfers assist     Chair/bed transfer assist level: Supervision/Verbal cueing     Locomotion Ambulation   Ambulation assist      Assist level: Minimal Assistance - Patient > 75% Assistive device: Walker-rolling Max distance: 200 ft  Walk 10 feet activity   Assist     Assist level: Minimal Assistance - Patient > 75% Assistive device: No Device   Walk 50 feet activity   Assist    Assist level: Minimal Assistance - Patient > 75% Assistive device: Walker-rolling    Walk 150 feet activity   Assist    Assist level: Minimal Assistance - Patient > 75% Assistive device: Walker-rolling    Walk 10 feet on uneven surface  activity   Assist Walk 10 feet on uneven surfaces activity did not occur: Safety/medical concerns         Wheelchair     Assist Is the patient using a wheelchair?: No             Wheelchair 50 feet with 2 turns activity    Assist            Wheelchair 150 feet activity     Assist          Blood pressure (!) 159/77, pulse  81, temperature 98.2 F (36.8 C), resp. rate 18, height 5\' 2"  (1.575 m), weight 61.4 kg, last menstrual period 05/22/2018, SpO2 100%.  Medical Problem List and Plan: 1. 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  Antithrombotics: -DVT/anticoagulation:  Pharmaceutical: Lovenox             -antiplatelet therapy: ASA/Brilinta.  3. Pain Management: Tylenol for pain prn. 4. Mood/Behavior/Sleep: Melatonin prn.             -antipsychotic agents: LCSW to follow for evaluation and support.  5. Neuropsych/cognition: This patient is capable of making decisions on her own behalf. 6. Skin/Wound Care: Routine pressure relief measures.  7. Fluids/Electrolytes/Nutrition: Monitor I/O. Monitor BMET  8. HTN: Monitor BP TID. On Coreg and amlodipine.              --Continues to be limited by dizziness--monitor for orthostatic changes.              -pt emphatic was using pill packs to do meds/bubble packs and says was compliant with meds? Vitals:   04/24/23 0430 04/24/23 0814  BP: (!) 150/73 (!) 159/77  Pulse: 80 81  Resp: 16 18  Temp: 98.2 F (36.8 C) 98.2 F (36.8 C)  SpO2: 100% 100%  11/12 bp with improvement this am. No changes to regimen 9. T2DM w/retinopathy and neuropathy:  Hgb A1c- 6.1 and well controlled. -- Monitor BS ac/hs and use SSI for elevated BS.  --Was on monjuaro at home CBG (last 3)  No results for input(s): "GLUCAP" in the last 72 hours.  Has not been receiving semaglutide 0.5mg  [place] q wk, will d/c in hospital but can  resume after d/c  10. 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 Limits fluids to 64 ounces otherwise on regular diet             --  resume Bicarb for NAGMA.     Latest Ref Rng & Units 04/24/2023    5:02 AM 04/23/2023    4:56 AM 04/22/2023    4:57 AM  BMP  Glucose 70 - 99 mg/dL 478  95    96  295   BUN 6 - 20 mg/dL 54  55    54  57   Creatinine 0.44 - 1.00 mg/dL 6.21  3.08    6.57  8.46   Sodium 135 - 145 mmol/L 143  140    139  141   Potassium 3.5 - 5.1 mmol/L 4.1  3.7    3.7  4.3   Chloride 98 - 111 mmol/L 114  113    113  113   CO2 22 - 32 mmol/L 21  19    21  19    Calcium 8.9 - 10.3 mg/dL 8.9  8.6    8.6  8.6   11/11 no significant change in BUN/Cr. Nephro feels this is close to likely baseline.   -encourage fluids  -rx UTI with keflex  11. OSA: Compliant with CPAP 12. H/o Depression: On Prozac--motivated and remains positive.  13. Constipation: will add miralax. If doesn't go by this evening, will add Sorbitol- scared to push  14. Anemia of chronic disease: Monitor H/H with serial checks, Hgb dropped   11/11 Likely acute on chronic component with further drop to 7.2 today   -one of two stool samples guaiac +    -multiple stools yesterday with mucous likely remnants of her emptying out   -iron infusion x 5 per nephrology   -will ask GI for recs, only has hx of GERD   -check another stool for occult blood as available   -increase protonix to bid   -recheck cbc in am- stable 11/12 15. Leucocytosis: Monitor for fevers and other signs of infection.              --recheck WBC in am.      Latest Ref Rng & Units 04/24/2023    5:02 AM 04/23/2023    4:56 AM 04/18/2023    5:31 AM  CBC  WBC 4.0 - 10.5 K/uL 6.4  5.7  6.0   Hemoglobin 12.0 - 15.0 g/dL 7.1  7.2  7.7   Hematocrit 36.0 - 46.0 % 21.9  22.6  23.9   Platelets 150 - 400 K/uL 166  176  190      16.  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  Occ white light at periphery of vision on right side , suspect retinal issues, sees Dr Valere Dross  from optho  for proliferative retinopathy ( also s/p catarct with IOL) will need f/u after discharge   18.  UTI: Keflex was not having dysuria but freq has improved  LOS: 7 days A FACE TO FACE EVALUATION WAS PERFORMED  Erick Colace 04/24/2023, 9:08 AM

## 2023-04-24 NOTE — Progress Notes (Signed)
Physical Therapy Session Note  Patient Details  Name: Christina Orozco MRN: [id number] Date of Birth: [birth year]  Today's Date: 04/24/2023 PT Individual Time: 2130-8657 PT Individual Time Calculation (min): 72 min   Short Term Goals: Week 1:  PT Short Term Goal 1 (Week 1): Pt will perform standing transfers with overall supervision. PT Short Term Goal 2 (Week 1): Pt will ambulate at least 200 ft with rollator and supervision. PT Short Term Goal 3 (Week 1): Pt will demonstrate MCID improvement in outcome measure. PT Short Term Goal 4 (Week 1): Pt will demonstrate improvement in maintaining path with minimal deviation during ambulation using rollator.  Skilled Therapeutic Interventions/Progress Updates: Patient supine in bed with nsg present on entrance to room. Patient alert and agreeable to PT session.   Patient reported no pain at beginning. Pt reported that L eye was "bigger" than R around the "black" part when looking in the mirror after going to the toilet with NT present. Attending RN notified this morning. Pt with no other complaints. Pt informed to continue to update care team with further changes in vision.   Therapeutic Activity: Bed Mobility: Pt performed supine<>sit on EOB with supervision and HOB slightly elevated. Pt long sitting in bed doffing hospital gown, and donning upper body/lower body clothing with PTA providing privacy.  Transfers: Pt performed sit<>stand transfers throughout session with rollator and with supervision for safety. Pt locked both rollator brakes this session vs only one as done in previous session without VC's.  - Pt ambulated from EOB to toilet to void urine and small BM (reported to RN) with supervision. Pt performed personal hygiene care with door closed for safety and ambulated to sink in rollator to wash hands and face. Pt recalled prior cue to lock rollator to the side and use sink as a stabilizer vs rollator without PTA having to intervene.   Gait  Training:  Pt ambulated from room<>day room gym using rollator with supervision. Pt continues to recall prior VC of heel-toe on B LE's without PTA having to verbalize cue. Pt demos safe navigation of hallway ambulation by looking both ways when crossing (especially to the L due to visual deficits).    Neuromuscular Re-ed: - Pt to ambulate to called out discs (either color or number -  4 discs around day room gym) within time limit set (15s) and to tap black bean bag for targeted coordination. Pt in rollator with close supervision/light CGA throughout with no LOB or symptoms of dizziness (task performed to trigger dizziness as pt reported such symptoms after becoming admitted to inpatient rehab). Pt progressed to having to also navigate around bowling pins (time increased to 20s with an extra disc added). Pt with CGA for safety. PTA occasionally would call out a number or color and after pt got close to it, another color or number would be called out for pt to quickly get to to increase coordination/navigation/safety awareness challenge. Pt knocked over 2 pins during 2nd round with no LOB (VC required for pt to avoid excessively reaching out of BOS to reach target in order to get it before time limit is [name]).  NMR performed for improvements in motor control and coordination, balance, sequencing, judgement, and self confidence/ efficacy in performing all aspects of mobility at highest level of independence.   Patient sitting EOB at end of session with brakes locked, medical staff present, bed alarm set, and all needs within reach.      Therapy Documentation Precautions:  Precautions Precautions: Fall  Precaution Comments: mild R ataxia Restrictions Weight Bearing Restrictions: No  Therapy/Group: Individual Therapy  Czarina Gingras PTA 04/24/2023, 11:41 AM

## 2023-04-24 NOTE — Plan of Care (Signed)
  Problem: Consults Goal: RH STROKE PATIENT EDUCATION Description: See Patient Education module for education specifics  Outcome: Progressing   Problem: RH BOWEL ELIMINATION Goal: RH STG MANAGE BOWEL WITH ASSISTANCE Description: STG Manage Bowel with toileting Assistance. Outcome: Progressing Goal: RH STG MANAGE BOWEL W/MEDICATION W/ASSISTANCE Description: STG Manage Bowel with Medication with mod I Assistance. Outcome: Progressing   Problem: RH SAFETY Goal: RH STG ADHERE TO SAFETY PRECAUTIONS W/ASSISTANCE/DEVICE Description: STG Adhere to Safety Precautions With cues Assistance/Device. Outcome: Progressing   Problem: RH KNOWLEDGE DEFICIT Goal: RH STG INCREASE KNOWLEDGE OF DIABETES Description: Patient will be able to manage DM with medications and dietary modification using educational resources independently Outcome: Progressing Goal: RH STG INCREASE KNOWLEDGE OF HYPERTENSION Description: Patient will be able to manage HTN with medications and dietary modification using educational resources independently Outcome: Progressing Goal: RH STG INCREASE KNOWLEGDE OF HYPERLIPIDEMIA Description: Patient will be able to manage HLD with medications and dietary modification using educational resources independently Outcome: Progressing Goal: RH STG INCREASE KNOWLEDGE OF STROKE PROPHYLAXIS Description: Patient will be able to manage secondary risks with medications and dietary modification using educational resources independently Outcome: Progressing

## 2023-04-25 DIAGNOSIS — D649 Anemia, unspecified: Secondary | ICD-10-CM | POA: Diagnosis not present

## 2023-04-25 DIAGNOSIS — I63311 Cerebral infarction due to thrombosis of right middle cerebral artery: Secondary | ICD-10-CM | POA: Diagnosis not present

## 2023-04-25 DIAGNOSIS — R195 Other fecal abnormalities: Secondary | ICD-10-CM

## 2023-04-25 LAB — RENAL FUNCTION PANEL
Albumin: 2.9 g/dL — ABNORMAL LOW (ref 3.5–5.0)
Anion gap: 6 (ref 5–15)
BUN: 58 mg/dL — ABNORMAL HIGH (ref 6–20)
CO2: 21 mmol/L — ABNORMAL LOW (ref 22–32)
Calcium: 8.8 mg/dL — ABNORMAL LOW (ref 8.9–10.3)
Chloride: 113 mmol/L — ABNORMAL HIGH (ref 98–111)
Creatinine, Ser: 3.57 mg/dL — ABNORMAL HIGH (ref 0.44–1.00)
GFR, Estimated: 15 mL/min — ABNORMAL LOW (ref >60)
Glucose, Bld: 105 mg/dL — ABNORMAL HIGH (ref 70–99)
Phosphorus: 4.5 mg/dL (ref 2.5–4.6)
Potassium: 4.1 mmol/L (ref 3.5–5.1)
Sodium: 140 mmol/L (ref 135–145)

## 2023-04-25 LAB — URINE CULTURE: Culture: 20000 — AB

## 2023-04-25 NOTE — Progress Notes (Signed)
   04/25/23 2037  BiPAP/CPAP/SIPAP  $ Non-Invasive Home Ventilator  Subsequent  BiPAP/CPAP/SIPAP Pt Type Adult  BiPAP/CPAP/SIPAP Resmed  Mask Type Nasal pillows  FiO2 (%) 21 %  Patient Home Equipment Yes  Auto Titrate Yes

## 2023-04-25 NOTE — Progress Notes (Signed)
Physical Therapy Session Note  Patient Details  Name: Christina Orozco MRN: [id number] Date of Birth: [birth year]  Today's Date: 04/25/2023 PT Individual Time: 1625-1709 PT Individual Time Calculation (min): 44 min   Short Term Goals: Week 2:  PT Short Term Goal 1 (Week 2): STG = LTG d/t ELOS  Skilled Therapeutic Interventions/Progress Updates:    Pt received supine in bed awake and agreeable to therapy session. Pt wearing taped glasses provided by OT. Supine>sitting R EOB, close supervision for safety due to visual deficits.  Sitting EOB, donned shoes min A for time management. Nurse present to disconnect IV.   Sit<>stands using rollator with close SBA for safety. Gait training ~15ft to main therapy gym using rollator with close SBA for safety - reciprocal stepping pattern, slow movements with focus on positioning of LEs, and decreased gait speed.  Gait training ~168ft, no AD, with light to heavy min assist for balance due to frequent R LOB. Pt demonstrating the following gait deviations with therapist providing the described cuing and facilitation for improvement:  - whole body ataxia - B UEs in high guard posturing  - L LE ataxia with incoordination and intermittent excessive L LE adduction causing R LOB  - intermittent L knee snapping back into hyperextension - facilitation at pt's pelvis for smooth transition of weight shifting onto stance limbs to decrease ataxia  Gait training ~125ft x2, no AD, first as just described, but pt demoing more relaxed upper body posturing, min decrease in whole body ataxia and L LE ataxia. For 2nd walk donned weighted vest and pt continues to demo slow, gradual improvements in all gait deviations as described, with a short distance of pt having very smooth, fluid gait with controlled weight shifting onto stance limbs.   Dynamic gait training wearing weighted vest of side stepping ~56ft in each direction with min A for balance - pt taking sufficient  step lengths with more imbalance when leading with L LE requiring a 2nd step with R LE to recover balance each time - when stepping towards R pt with intermittent minor posterior LOB.   Gait training ~181ft back to room, no AD, with min A as described above with pt demoing improvement in whole body ataxia, decrease L LE ataxia with decreased instances of L LE adduction; however, increased frequency of L knee snapping back into hyperextension due to fatigue, but able to correct majority of the time with verbal cuing.   Pt left seated EOB with needs in reach and bed alarm on.     Therapy Documentation Precautions:  Precautions Precautions: Fall Precaution Comments: mild R ataxia Restrictions Weight Bearing Restrictions: No   Pain:  No reports of pain throughout session.    Therapy/Group: Individual Therapy  Ginny Forth , PT, DPT, NCS, CSRS 04/25/2023, 3:22 PM

## 2023-04-25 NOTE — Patient Care Conference (Signed)
Inpatient RehabilitationTeam Conference and Plan of Care Update Date: 04/25/2023   Time: 10:12 AM    Patient Name: Christina Orozco      Medical Record Number: [id number]  Date of Birth: September 14, 1970 Sex: Female         Room/Bed: 4W10C/4W10C-01 Payor Info: Payor: [employer]vertising copy[employer] MEDICARE / Plan: Suan Halter DUAL COMPLETE / Product Type: *No Product type* /    Admit Date/Time:  04/17/2023 12:56 PM  Primary Diagnosis:  Stroke (cerebrum) [facility])  Hospital Problems: Principal Problem:   Stroke (cerebrum) (HCC) Active Problems:   Acute on chronic anemia   Chronic kidney disease (CKD), stage IV (severe) (HCC)   Fecal occult blood test positive    Expected Discharge Date: Expected Discharge Date: 04/28/23  Team Members Present: Physician leading conference: Dr. Claudette Laws Social Worker Present: Lavera Guise, BSW Nurse Present: Chana Bode, RN PT Present: Ralph Leyden, PT OT Present: Primitivo Gauze, OT SLP Present: Everardo Pacific, SLP PPS Coordinator present : Fae Pippin, SLP     Current Status/Progress Goal Weekly Team Focus  Bowel/Bladder   patient is continent/incontinent of b/b - patient is mostly continent with episodes of incontinence   improve continence   offer toileting q2hrs when awake and 4hrs over night    Swallow/Nutrition/ Hydration               ADL's   supervision   Mod I   ADL training, visual motor exercises, FMC, pt education    Mobility   Bed mobility = Mod I, transfers = supervision, ambulation = long distance using RW with supervision/ CGA for vision deficits   overall Mod I/ sup  Barriers: vision // Work on: vision compensation strategies, activity tolerance for community mobility, functional ADLs, continued education    Communication                Safety/Cognition/ Behavioral Observations               Pain   no complaints of pain   maintain pain free   assess pain qshift and PRN    Skin   skin is  intact   maintain skin integrity  assess skin qshift and PRN      Discharge Planning:  Discharging home independently with brother to provide intermittent assistance. Barriers: No caregiver support. 1 level home, level entry. Patient has a RW, Rollator, BSC and SPC.   Team Discussion: Patient post CVA; medically stable.  Patient on target to meet rehab goals: yes, currently distant supervision overall for ADLs and mod I for mobility.  Limited by diplopia managed with remediation using vision compensation strategies.  *See Care Plan and progress notes for long and short-term goals.   Revisions to Treatment Plan:  Ophthalmology follow up    Teaching Needs: Safety, medications, transfers, toileting, etc.   Current Barriers to Discharge: Home enviroment access/layout and Lack of/limited family support  Possible Resolutions to Barriers: Transportation available on the weekends     Medical Summary Current Status: vision improved, renal status worsened but nephro feels like this is just a fluctuation, stool OB + x 1  Barriers to Discharge: Medical stability;Renal Insufficiency/Failure   Possible Resolutions to [employer] Focus: nephro consult signed off f/u as OP, GI signed off, f/u OP   Continued Need for Acute Rehabilitation Level of Care: The patient requires daily medical management by a physician with specialized training in physical medicine and rehabilitation for the following reasons: Direction of a multidisciplinary physical rehabilitation program to maximize functional  independence : Yes Medical management of patient stability for increased activity during participation in an intensive rehabilitation regime.: Yes Analysis of laboratory values and/or radiology reports with any subsequent need for medication adjustment and/or medical intervention. : Yes   I attest that I was present, lead the team conference, and concur with the assessment and plan of the  team.   Chana Bode B 04/25/2023, 1:40 PM

## 2023-04-25 NOTE — Progress Notes (Signed)
PROGRESS NOTE   Subjective/Complaints:  Appreciate GI note , GI and Nephro have signed   ROS: Patient deniesCP, SOB, N/V/D Objective:   No results found. Recent Labs    04/23/23 0456 04/24/23 0502  WBC 5.7 6.4  HGB 7.2* 7.1*  HCT 22.6* 21.9*  PLT 176 166    Recent Labs    04/24/23 0502 04/25/23 0529  NA 143 140  K 4.1 4.1  CL 114* 113*  CO2 21* 21*  GLUCOSE 103* 105*  BUN 54* 58*  CREATININE 3.11* 3.57*  CALCIUM 8.9 8.8*    Intake/Output Summary (Last 24 hours) at 04/25/2023 0939 Last data filed at 04/24/2023 1800 Gross per 24 hour  Intake 473 ml  Output --  Net 473 ml        Physical Exam: Vital Signs Blood pressure (!) 149/73, pulse 73, temperature 98.3 F (36.8 C), resp. rate 18, height 5\' 2"  (1.575 m), weight 61.2 kg, last menstrual period 05/22/2018, SpO2 100%.    General: No acute distress Mood and affect are appropriate Heart: Regular rate and rhythm no rubs murmurs or extra sounds Lungs: Clear to auscultation, breathing unlabored, no rales or wheezes Abdomen: Positive bowel sounds, soft nontender to palpation, nondistended Extremities: No clubbing, cyanosis, or edema  Skin: No evidence of breakdown, no evidence of rash Neurologic:  right eye eye with reduce adduction when attempting left lateral gaze  + left horz  nystagmus , motor strength is 4/5 in bilateral deltoid, bicep, tricep, grip, hip flexor, knee extensors, ankle dorsiflexor and plantar flexor Visual fields intact in all 4 quadrants with confrontation testing--no changes in neuro exam 11/11  Musculoskeletal: Full range of motion in all 4 extremities. No joint swelling    Assessment/Plan: 1. Functional deficits which require 3+ hours per day of interdisciplinary therapy in a comprehensive inpatient rehab setting. Physiatrist is providing close team supervision and 24 hour management of active medical problems listed  below. Physiatrist and rehab team continue to assess barriers to discharge/monitor patient progress toward functional and medical goals  Care Tool:  Bathing    Body parts bathed by patient: Right arm, Left arm, Chest, Abdomen, Front perineal area, Buttocks, Right upper leg, Face, Left lower leg, Right lower leg, Left upper leg         Bathing assist Assist Level: Set up assist     Upper Body Dressing/Undressing Upper body dressing   What is the patient wearing?: Bra, Pull over shirt    Upper body assist Assist Level: Set up assist    Lower Body Dressing/Undressing Lower body dressing      What is the patient wearing?: Underwear/pull up, Pants     Lower body assist Assist for lower body dressing: Supervision/Verbal cueing     Toileting Toileting    Toileting assist Assist for toileting: Supervision/Verbal cueing     Transfers Chair/bed transfer  Transfers assist     Chair/bed transfer assist level: Supervision/Verbal cueing     Locomotion Ambulation   Ambulation assist      Assist level: Minimal Assistance - Patient > 75% Assistive device: Walker-rolling Max distance: 200 ft   Walk 10 feet activity   Assist  Assist level: Minimal Assistance - Patient > 75% Assistive device: No Device   Walk 50 feet activity   Assist    Assist level: Minimal Assistance - Patient > 75% Assistive device: Walker-rolling    Walk 150 feet activity   Assist    Assist level: Minimal Assistance - Patient > 75% Assistive device: Walker-rolling    Walk 10 feet on uneven surface  activity   Assist Walk 10 feet on uneven surfaces activity did not occur: Safety/medical concerns         Wheelchair     Assist Is the patient using a wheelchair?: No             Wheelchair 50 feet with 2 turns activity    Assist            Wheelchair 150 feet activity     Assist          Blood pressure (!) 149/73, pulse 73, temperature  98.3 F (36.8 C), resp. rate 18, height 5\' 2"  (1.575 m), weight 61.2 kg, last menstrual period 05/22/2018, SpO2 100%.  Medical Problem List and Plan: 1. 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 Formulized individual treatment plan based on medical history, underlying problem and comorbidities.  2.  Antithrombotics: -DVT/anticoagulation:  Pharmaceutical: Lovenox             -antiplatelet therapy: ASA/Brilinta.  3. Pain Management: Tylenol for pain prn. 4. Mood/Behavior/Sleep: Melatonin prn.             -antipsychotic agents: LCSW to follow for evaluation and support.  5. Neuropsych/cognition: This patient is capable of making decisions on her own behalf. 6. Skin/Wound Care: Routine pressure relief measures.  7. Fluids/Electrolytes/Nutrition: Monitor I/O. Monitor BMET  8. HTN: Monitor BP TID. On Coreg and amlodipine.              --Continues to be limited by dizziness--monitor for orthostatic changes.              -pt emphatic was using pill packs to do meds/bubble packs and says was compliant with meds? Vitals:   04/24/23 1935 04/25/23 0443  BP: (!) 146/75 (!) 149/73  Pulse: 75 73  Resp: 18   Temp: 98.1 F (36.7 C) 98.3 F (36.8 C)  SpO2: 100% 100%  11/12 bp with improvement this am. No changes to regimen 9. T2DM w/retinopathy and neuropathy:  Hgb A1c- 6.1 and well controlled. -- Monitor BS ac/hs and  use SSI for elevated BS.  --Was on monjuaro at home CBG (last 3)  No results for input(s): "GLUCAP" in the last 72 hours.  Has not been receiving semaglutide 0.5mg  [place] q wk, will d/c in hospital but can resume after d/c  10.  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 Limits fluids to 64 ounces otherwise on regular diet             --resume Bicarb for NAGMA.     Latest Ref Rng & Units 04/25/2023    5:29 AM 04/24/2023    5:02 AM 04/23/2023    4:56 AM  BMP  Glucose 70 - 99 mg/dL 621  308  95    96   BUN 6 - 20 mg/dL 58  54  55    54   Creatinine 0.44 - 1.00 mg/dL 6.57  8.46  9.62    9.52   Sodium 135 - 145 mmol/L 140  143  140    139   Potassium 3.5 - 5.1 mmol/L 4.1  4.1  3.7    3.7   Chloride 98 - 111 mmol/L 113  114  113    113   CO2 22 - 32 mmol/L 21  21  19    21    Calcium 8.9 - 10.3 mg/dL 8.8  8.9  8.6    8.6   11/11 no significant change in BUN/Cr. Nephro feels this is close to likely baseline.   -encourage fluids  -rx UTI with keflex  11. OSA: Compliant with CPAP 12. H/o Depression: On Prozac--motivated and remains positive.  13. Constipation: will add miralax. If doesn't go by this evening, will add Sorbitol- scared to push  14. Anemia of chronic disease: Monitor H/H with serial checks, Hgb dropped   11/11 Likely acute on chronic component with further drop to 7.2 today   -one of two stool samples guaiac +    -multiple stools yesterday with mucous likely remnants of her emptying out   -iron infusion x 5 per nephrology   -will ask GI for recs, only has hx of GERD   -check another stool for occult blood as available   -increase protonix to bid   -recheck cbc in am- stable 11/12 15. Leucocytosis: Monitor for fevers and other signs of infection.              --recheck WBC in am.      Latest Ref Rng & Units 04/24/2023    5:02 AM 04/23/2023    4:56 AM 04/18/2023    5:31 AM  CBC  WBC 4.0 - 10.5 K/uL 6.4  5.7  6.0   Hemoglobin 12.0 - 15.0 g/dL 7.1  7.2  7.7   Hematocrit 36.0 -  46.0 % 21.9  22.6  23.9   Platelets 150 - 400 K/uL 166  176  190      16.  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  Occ white light at periphery of vision on right side , suspect retinal issues, sees Dr Valere Dross  from optho  for proliferative retinopathy ( also s/p catarct with IOL) will need f/u after discharge   18.  UTI: Keflex was not having dysuria but freq has improved, will have 5 d treatment  LOS: 8 days A FACE TO FACE EVALUATION WAS PERFORMED  Erick Colace 04/25/2023, 9:39 AM

## 2023-04-25 NOTE — Progress Notes (Signed)
Patient ID: Christina Orozco, female   DOB: [birth year], 52 y.o.   MRN: [id number]  Team Conference Report to Patient/Family  Team Conference discussion was reviewed with the patient and caregiver, including goals, any changes in plan of care and target discharge date.  Patient and caregiver express understanding and are in agreement.  The patient has a target discharge date of 04/28/23.  SW met with patient and spoke with brother, [name] via telephone. Sw confirmed discharge on Saturday. Brother will be present on Saturday about 9 AM for discharge. No additional questions or concerns.  Andria Rhein 04/25/2023, 2:07 PM

## 2023-04-25 NOTE — Progress Notes (Signed)
Occupational Therapy Session Note  Patient Details  Name: Christina Orozco MRN: [id number] Date of Birth: 1971/05/12  Today's Date: 04/25/2023 OT Individual Time: 4098-1191 OT Individual Time Calculation (min): 50 min    Short Term Goals: Week 1:  OT Short Term Goal 1 (Week 1): Pt will demonstrate improved R FMC to be able to open frozen food containers and place food items in microwave. OT Short Term Goal 2 (Week 1): Pt will be able to ambulated to bathroom with RW with supervision only. OT Short Term Goal 3 (Week 1): Pt will demonstrate her ability to do diplopia visual exercises on her own without cues.    Skilled Therapeutic Interventions/Progress Updates:     Pt received in bed ready for therapy.  Focus of therapy session on balance and visual remediation activity.      ADL Retraining:  -kitchen activities with moving items from freezer to microwave to counter using rollator to move items   -discussed healthy eating suggestions as pt uses microwave meals and discussed high sodium content of many of those meals   Therapeutic Activity/ Exercise: - in gym, worked on visual tracking exercises with R eye and B eyes. Pt demonstrated improved symmetry of eyes during tracking - adjusted tape on glasses for occlusion     Pt resting in bed with all needs met.  call light in reach.     Therapy Documentation Precautions:  Precautions Precautions: Fall Precaution Comments: mild R ataxia Restrictions Weight Bearing Restrictions: No General:   Vital Signs:   Pain:   ADL: ADL ADL Comments: close supervision with all tasks of self care, CGA with ambulation with RW   Therapy/Group: Individual Therapy  Karrine Kluttz 04/25/2023, 10:20 AM

## 2023-04-25 NOTE — Progress Notes (Signed)
Physical Therapy Weekly Progress Note  Patient Details  Name: Christina Orozco MRN: [id number] Date of Birth: 1971-01-10  Beginning of progress report period: April 18, 2023 End of progress report period: April 25, 2023  Today's Date: 04/25/2023 PT Individual Time: 0806-0902 PT Individual Time Calculation (min): 56 min   Patient has met {number 1-5:22450} of {number 1-5:20334} short term goals.  ***  Patient continues to demonstrate the following deficits {impairments:3041632} and therefore will continue to benefit from skilled PT intervention to increase functional independence with mobility.  Patient {LTG progression:3041653}.  {plan of WUJW:1191478}  PT Short Term Goals {GNF:6213086}  Skilled Therapeutic Interventions/Progress Updates:      Therapy Documentation Precautions:  Precautions Precautions: Fall Precaution Comments: mild R ataxia Restrictions Weight Bearing Restrictions: No General:   Vital Signs: Therapy Vitals Temp: 98.3 F (36.8 C) Pulse Rate: 73 BP: (!) 149/73 Patient Position (if appropriate): Lying Oxygen Therapy SpO2: 100 % O2 Device: CPAP Pain:   Vision/Perception     Mobility:   Locomotion :    Trunk/Postural Assessment :    Balance:   Exercises:   Other Treatments:     Therapy/Group: {Therapy/Group:3049007}  Loel Dubonnet 04/25/2023, 7:46 AM

## 2023-04-25 NOTE — Progress Notes (Signed)
Occupational Therapy Session Note  Patient Details  Name: Christina Orozco MRN: [id number] Date of Birth: 02/21/1971  Today's Date: 04/25/2023 OT Individual Time: 1884-1660 OT Individual Time Calculation (min): 55 min    Short Term Goals: Week 1:  OT Short Term Goal 1 (Week 1): Pt will demonstrate improved R FMC to be able to open frozen food containers and place food items in microwave. OT Short Term Goal 2 (Week 1): Pt will be able to ambulated to bathroom with RW with supervision only. OT Short Term Goal 3 (Week 1): Pt will demonstrate her ability to do diplopia visual exercises on her own without cues.  Skilled Therapeutic Interventions/Progress Updates:  Pt greeted supine in bed, pt agreeable to OT intervention.      Transfers/bed mobility: pt completed bed mobility and all functional ambulation with rollator with supervision.   Therapeutic activity: pt completed below visual therapeutic activities to facilitate improved visual tracking and oculomotor strength. -Pencil push ups with bilateral eyes -Visual tracking with pt drawing "figure 8s" with pencil and tracking with one eye at a time -Visual tracking in front of mirror with pt needing to track R and L to match squigz to correct letter on mirror -brock string exercises with pt instructed to look from last bead to middle bead, pt continues to endorse diplopia when scanning from one bead to another  ADLs:  Grooming: pt stood at sink for hand hygiene MODI Toileting: continent urine void. Pt completed 3/3 toiletings task with MODI   Ended session with pt seated EOB with all needs within reach.               Therapy Documentation Precautions:  Precautions Precautions: Fall Precaution Comments: mild R ataxia Restrictions Weight Bearing Restrictions: No  Pain: no pain reported during session     Therapy/Group: Individual Therapy  Barron Schmid 04/25/2023, 12:27 PM

## 2023-04-26 ENCOUNTER — Encounter (HOSPITAL_COMMUNITY): Payer: Self-pay

## 2023-04-26 DIAGNOSIS — H532 Diplopia: Secondary | ICD-10-CM | POA: Diagnosis not present

## 2023-04-26 DIAGNOSIS — E1149 Type 2 diabetes mellitus with other diabetic neurological complication: Secondary | ICD-10-CM | POA: Diagnosis not present

## 2023-04-26 DIAGNOSIS — E1122 Type 2 diabetes mellitus with diabetic chronic kidney disease: Secondary | ICD-10-CM | POA: Diagnosis not present

## 2023-04-26 DIAGNOSIS — I63311 Cerebral infarction due to thrombosis of right middle cerebral artery: Secondary | ICD-10-CM | POA: Diagnosis not present

## 2023-04-26 LAB — RENAL FUNCTION PANEL
Albumin: 3.1 g/dL — ABNORMAL LOW (ref 3.5–5.0)
Anion gap: 6 (ref 5–15)
BUN: 58 mg/dL — ABNORMAL HIGH (ref 6–20)
CO2: 21 mmol/L — ABNORMAL LOW (ref 22–32)
Calcium: 8.7 mg/dL — ABNORMAL LOW (ref 8.9–10.3)
Chloride: 111 mmol/L (ref 98–111)
Creatinine, Ser: 3.89 mg/dL — ABNORMAL HIGH (ref 0.44–1.00)
GFR, Estimated: 14 mL/min — ABNORMAL LOW (ref >60)
Glucose, Bld: 98 mg/dL (ref 70–99)
Phosphorus: 4.2 mg/dL (ref 2.5–4.6)
Potassium: 3.8 mmol/L (ref 3.5–5.1)
Sodium: 138 mmol/L (ref 135–145)

## 2023-04-26 NOTE — Plan of Care (Signed)
Alert and oriented. No complaints of pain overnight. Up with 1 person assist. Slept with home cpap on. No overnight issues.    Problem: Consults Goal: RH STROKE PATIENT EDUCATION Description: See Patient Education module for education specifics  Outcome: Progressing   Problem: RH BOWEL ELIMINATION Goal: RH STG MANAGE BOWEL WITH ASSISTANCE Description: STG Manage Bowel with toileting Assistance. Outcome: Progressing Goal: RH STG MANAGE BOWEL W/MEDICATION W/ASSISTANCE Description: STG Manage Bowel with Medication with mod I Assistance. Outcome: Progressing   Problem: RH SAFETY Goal: RH STG ADHERE TO SAFETY PRECAUTIONS W/ASSISTANCE/DEVICE Description: STG Adhere to Safety Precautions With cues Assistance/Device. Outcome: Progressing   Problem: RH KNOWLEDGE DEFICIT Goal: RH STG INCREASE KNOWLEDGE OF DIABETES Description: Patient will be able to manage DM with medications and dietary modification using educational resources independently Outcome: Progressing Goal: RH STG INCREASE KNOWLEDGE OF HYPERTENSION Description: Patient will be able to manage HTN with medications and dietary modification using educational resources independently Outcome: Progressing Goal: RH STG INCREASE KNOWLEGDE OF HYPERLIPIDEMIA Description: Patient will be able to manage HLD with medications and dietary modification using educational resources independently Outcome: Progressing Goal: RH STG INCREASE KNOWLEDGE OF STROKE PROPHYLAXIS Description: Patient will be able to manage secondary risks with medications and dietary modification using educational resources independently Outcome: Progressing

## 2023-04-26 NOTE — Progress Notes (Addendum)
Patient ID: Christina Orozco, female   DOB: 1970-07-12, 52 y.o.   MRN: [id number]  Referral sent to [facility] for review. 11/15 10:17 AM: Referral declined due to insurance.

## 2023-04-26 NOTE — Progress Notes (Signed)
Patient ID: Christina Orozco, female   DOB: 24-Jan-1971, 52 y.o.   MRN: [id number]   [facility] referral faxed.

## 2023-04-26 NOTE — Group Note (Signed)
Patient Details Name: Christina Orozco MRN: [id number] DOB: 1970-11-02 Today's Date: 04/26/2023  Time Calculation: OT Group Time Calculation OT Group Start Time: 1430 OT Group Stop Time: 1530 OT Group Time Calculation (min): 60 min      Group Description: Dance Group: Pt participated in dance group with an emphasis on social interaction, motor planning, increasing overall activity tolerance and bimanual tasks. All songs were selected by group members. Dance moves included AROM of BUE/BLE gross motor movements with an emphasis on building functional endurance.    Individual level documentation: Patient completed group from sitting level. Patientt needed supervision to complete various dance moves with OT providing visual model.  Patient able to create her own modifications during group.  Pain:  0/10  Precautions:  Falls   Clide Deutscher 04/26/2023, 3:59 PM

## 2023-04-26 NOTE — Progress Notes (Signed)
PROGRESS NOTE   Subjective/Complaints:  Appreciate GI note , GI and Nephro have signed out   ROS: Patient deniesCP, SOB, N/V/D Objective:   No results found. Recent Labs    04/24/23 0502  WBC 6.4  HGB 7.1*  HCT 21.9*  PLT 166    Recent Labs    04/24/23 0502 04/25/23 0529  NA 143 140  K 4.1 4.1  CL 114* 113*  CO2 21* 21*  GLUCOSE 103* 105*  BUN 54* 58*  CREATININE 3.11* 3.57*  CALCIUM 8.9 8.8*    Intake/Output Summary (Last 24 hours) at 04/26/2023 0744 Last data filed at 04/26/2023 0733 Gross per 24 hour  Intake 720 ml  Output --  Net 720 ml        Physical Exam: Vital Signs Blood pressure (!) 149/73, pulse 82, temperature 98.3 F (36.8 C), resp. rate 18, height 5\' 2"  (1.575 m), weight 61.9 kg, last menstrual period 05/22/2018, SpO2 100%.    General: No acute distress Mood and affect are appropriate Heart: Regular rate and rhythm no rubs murmurs or extra sounds Lungs: Clear to auscultation, breathing unlabored, no rales or wheezes Abdomen: Positive bowel sounds, soft nontender to palpation, nondistended Extremities: No clubbing, cyanosis, or edema  Skin: No evidence of breakdown, no evidence of rash Neurologic:  right eye eye with reduce adduction when attempting left lateral gaze  + left horz  nystagmus , motor strength is 4/5 in bilateral deltoid, bicep, tricep, grip, hip flexor, knee extensors, ankle dorsiflexor and plantar flexor Visual fields intact in all 4 quadrants with confrontation testing--no changes in neuro exam 11/11  Musculoskeletal: Full range of motion in all 4 extremities. No joint swelling    Assessment/Plan: 1. Functional deficits which require 3+ hours per day of interdisciplinary therapy in a comprehensive inpatient rehab setting. Physiatrist is providing close team supervision and 24 hour management of active medical problems listed below. Physiatrist and rehab team  continue to assess barriers to discharge/monitor patient progress toward functional and medical goals  Care Tool:  Bathing    Body parts bathed by patient: Right arm, Left arm, Chest, Abdomen, Front perineal area, Buttocks, Right upper leg, Face, Left lower leg, Right lower leg, Left upper leg         Bathing assist Assist Level: Set up assist     Upper Body Dressing/Undressing Upper body dressing   What is the patient wearing?: Bra, Pull over shirt    Upper body assist Assist Level: Set up assist    Lower Body Dressing/Undressing Lower body dressing      What is the patient wearing?: Underwear/pull up, Pants     Lower body assist Assist for lower body dressing: Supervision/Verbal cueing     Toileting Toileting    Toileting assist Assist for toileting: Supervision/Verbal cueing     Transfers Chair/bed transfer  Transfers assist     Chair/bed transfer assist level: Supervision/Verbal cueing     Locomotion Ambulation   Ambulation assist      Assist level: Contact Guard/Touching assist Assistive device: Rollator Max distance: 600 ft   Walk 10 feet activity   Assist     Assist level: Supervision/Verbal cueing Assistive device:  Rollator   Walk 50 feet activity   Assist    Assist level: Contact Guard/Touching assist Assistive device: Rollator    Walk 150 feet activity   Assist    Assist level: Contact Guard/Touching assist Assistive device: Rollator    Walk 10 feet on uneven surface  activity   Assist Walk 10 feet on uneven surfaces activity did not occur: Safety/medical concerns   Assist level: Supervision/Verbal cueing Assistive device: Rollator   Wheelchair     Assist Is the patient using a wheelchair?: No   Wheelchair activity did not occur: N/A         Wheelchair 50 feet with 2 turns activity    Assist    Wheelchair 50 feet with 2 turns activity did not occur: N/A       Wheelchair 150 feet activity      Assist  Wheelchair 150 feet activity did not occur: N/A       Blood pressure (!) 149/73, pulse 82, temperature 98.3 F (36.8 C), resp. rate 18, height 5\' 2"  (1.575 m), weight 61.9 kg, last menstrual period 05/22/2018, SpO2 100%.  Medical Problem List and Plan: 1. 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 Formulized individual treatment plan based on medical history, underlying problem and comorbidities.  2.  Antithrombotics: -DVT/anticoagulation:  Pharmaceutical: Lovenox             -antiplatelet therapy: ASA/Brilinta.  3. Pain Management: Tylenol for pain prn. 4. Mood/Behavior/Sleep: Melatonin prn.             -antipsychotic agents: LCSW to follow for evaluation and support.  5. Neuropsych/cognition: This patient is capable of making decisions on her own behalf. 6. Skin/Wound Care: Routine pressure relief measures.  7. Fluids/Electrolytes/Nutrition: Monitor I/O. Monitor BMET  8. HTN: Monitor BP TID. On Coreg and amlodipine.              --Continues to be limited by dizziness--monitor for orthostatic changes.              -pt emphatic was using pill packs to do meds/bubble packs and says was compliant with meds? Vitals:   04/26/23 0526 04/26/23 0724  BP: (!) 155/79 (!) 149/73  Pulse: 72 82  Resp: 18   Temp: 98.3 F (36.8 C)   SpO2: 100% 100%  11/12 bp with improvement this am. No changes to  regimen 9. T2DM w/retinopathy and neuropathy:  Hgb A1c- 6.1 and well controlled. -- Monitor BS ac/hs and use SSI for elevated BS.  --Was on monjuaro at home CBG (last 3)  No results for input(s): "GLUCAP" in the last 72 hours.  Has not been receiving semaglutide 0.5mg  [place] q wk, will d/c in hospital but can resume after d/c  10. 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 Limits fluids to 64 ounces otherwise on regular diet             --resume Bicarb for NAGMA.     Latest Ref Rng & Units 04/25/2023    5:29 AM 04/24/2023    5:02 AM 04/23/2023    4:56 AM  BMP  Glucose 70 - 99 mg/dL 409  811  95    96   BUN 6 - 20 mg/dL 58  54  55    54   Creatinine 0.44 - 1.00 mg/dL 9.14  7.82  9.56    2.13   Sodium 135 - 145 mmol/L 140  143  140    139   Potassium 3.5 - 5.1 mmol/L 4.1  4.1  3.7    3.7   Chloride 98 - 111 mmol/L 113  114  113    113   CO2 22 - 32 mmol/L 21  21  19    21    Calcium 8.9 - 10.3 mg/dL 8.8  8.9  8.6    8.6     -encourage fluids Has restriction but only drinking 400-566mL   -rx UTI with keflex  11. OSA: Compliant with CPAP 12. H/o Depression: On Prozac--motivated and remains positive.  13. Constipation: will add miralax. If doesn't go by this evening, will add Sorbitol- scared to push  14. Anemia of chronic disease: Monitor H/H with serial checks, Hgb dropped   11/11 Likely acute on chronic component with further drop to 7.2 today   -one of two stool samples guaiac +    -multiple stools yesterday with mucous likely remnants of her emptying out   -iron infusion x 5 per nephrology   -will ask GI for recs, only has hx of GERD   -check another stool for occult blood as available   -increase protonix to bid   -recheck cbc in am- stable 11/12 15. Leucocytosis: Monitor for fevers and other signs of infection.              --recheck WBC in am.      Latest Ref Rng & Units 04/24/2023    5:02 AM 04/23/2023    4:56 AM 04/18/2023    5:31 AM  CBC  WBC 4.0 - 10.5 K/uL 6.4   5.7  6.0   Hemoglobin 12.0 - 15.0 g/dL 7.1  7.2  7.7   Hematocrit 36.0 - 46.0 % 21.9  22.6  23.9   Platelets 150 - 400 K/uL 166  176  190      16.  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  Occ white light at periphery of vision on right side , suspect retinal issues, sees Dr Valere Dross  from optho  for proliferative retinopathy ( also s/p catarct with IOL) will need f/u after discharge   18.  UTI: Keflex was not having dysuria but freq has improved, will have 5 d treatment  LOS: 9 days A FACE TO FACE EVALUATION WAS PERFORMED  Erick Colace 04/26/2023, 7:44 AM

## 2023-04-26 NOTE — Progress Notes (Signed)
Physical Therapy Session Note  Patient Details  Name: Christina Orozco MRN: [id number] Date of Birth: 1970-09-08  Today's Date: 04/26/2023 PT Individual Time: 4098-1191 PT Individual Time Calculation (min): 68 min   Short Term Goals: Week 2:  PT Short Term Goal 1 (Week 2): STG = LTG d/t ELOS  Skilled Therapeutic Interventions/Progress Updates: Patient supine in bed on entrance to room. Patient alert and agreeable to PT session.   Patient reported no pain and endorses getting adequate sleep the previous night.  Therapeutic Activity: Bed Mobility: Pt performed supine<>sit on EOB with modI (HOB slightly elevated). Transfers: Pt performed sit<>stand transfers throughout session with modI and to rollator.   Gait Training:  Pt ambulated room<main gym using rollator with supervision for safety (pt demos safe navigation of busy hallway by looking both ways when coming to an intersection. Pt with decreased cadence throughout. At end of session, pt ambulated 500' from ortho gym + 4 x around main gym with 17lb weighted vest donned and in rollator in order to strengthen ambulatory tolerance and B LE strength. Pt with supervision throughout and ambulated until needing to take a seated rest break. No reports of SOB, only fatigue and requested return to room to rest.   Neuromuscular Re-ed: NMR facilitated during session with focus on memory recall, dynamic standing balance, coordination. - BITS system Visual Pursuit --> Rotator Sequence. Pt performed in standing with AD and with close supervision for safety. Pt required increased time and min cues to recall place in sequence. Pt also demos anterior weight shift with no LOB.  - Accuracy 56.6%  - Time to complete 4:26  - Reaction time 8.88s - BITS system Bell Cancellation. Pt performed in standing with AD and with close supervision for safety. Pt continued to demo anterior and lateral weight shifting throughout with no LOB. Pt progressed since last time  performing this assessment by demonstrating even misses on either side with 0 distracting hits (improved coordination on R UE L UE having to stabilizing as pt did last time).   - Completed in 2:59  - 4 misses and 0 distracting hits  NMR performed for improvements in motor control and coordination, balance, sequencing, judgement, and self confidence/ efficacy in performing all aspects of mobility at highest level of independence.   Patient supine in bed at end of session with brakes locked and has been made modI as of this date.    Therapy Documentation Precautions:  Precautions Precautions: Fall Precaution Comments: mild R ataxia Restrictions Weight Bearing Restrictions: No   Therapy/Group: Individual Therapy  Safina Huard PTA 04/26/2023, 11:31 AM

## 2023-04-26 NOTE — Progress Notes (Signed)
Occupational Therapy Weekly Progress Note  Patient Details  Name: Christina Orozco MRN: [id number] Date of Birth: 07-Jul-1970  Beginning of progress report period: April 18, 2023 End of progress report period: April 26, 2023  Today's Date: 04/26/2023 OT Individual Time: 4098-1191 OT Individual Time Calculation (min): 75 min    Patient has met 3 of 3 short term goals.  Pt has made excellent progress and is close to meeting all of her LTGs for discharge on Saturday.   Patient continues to demonstrate the following deficits: decreased coordination, decreased visual motor skills and diplopia, and hemiplegia and decreased balance strategies and therefore will continue to benefit from skilled OT intervention to enhance overall performance with BADL and iADL.  Patient progressing toward long term goals..  Continue plan of care.  OT Short Term Goals Week 1:  OT Short Term Goal 1 (Week 1): Pt will demonstrate improved R FMC to be able to open frozen food containers and place food items in microwave. OT Short Term Goal 1 - Progress (Week 1): Met OT Short Term Goal 2 (Week 1): Pt will be able to ambulated to bathroom with RW with supervision only. OT Short Term Goal 2 - Progress (Week 1): Met OT Short Term Goal 3 (Week 1): Pt will demonstrate her ability to do diplopia visual exercises on her own without cues. OT Short Term Goal 3 - Progress (Week 1): Met Week 2:  OT Short Term Goal 1 (Week 2): STGs = LTGs  Skilled Therapeutic Interventions/Progress Updates:     Pt received in bed ready for therapy.  Focus of therapy session on R hand coordination, visual compensation and remediation for ADL tasks.      ADL Retraining: -pt was modified independent completing toileting, dressing, standing at sink for self care and all transfers using rollator for support. Pt completed shower last night with nurse tech present. Pt and NT reported she was independent in shower with tub bench. She has a tub  bench at home  Therapeutic Activity/ Exercise: -pt ambulated over 1000 ft from room to gym with 4WW. -pt asking about umbrella attachments for 4WW. Found some on [employer] designed for w/c or scooters she could try but probably not tall enough for head clearance. Recommend she use a rain poncho with a hood and a plastic tarp or tablecloth to cover her walker.  -visual pursuits standing at [employer] - pt worked on visual tracking and reaction time with an average of 77% accuracy.  The bioness set at arms length distance allowed her to see dots/targets without double vision. Pt able to stand for over 5 minutes at a time,with a few seated rest breaks. Pt worked on 2 different pursuits exercises 3x each.    Balance: -standing at arm bike for 5 minutes at resistance 10 working on dynamic standing balance. She had to keep eyes closed to avoid getting dizzy due to rotational movements of upper body.   Pt ambulated back to room. Pt resting in bed with all needs met.  call light in reach.   Pt is now modified independent in her room.   Therapy Documentation Precautions:  Precautions Precautions: Fall Precaution Comments: mild R ataxia Restrictions Weight Bearing Restrictions: No    Vital Signs: Therapy Vitals Pulse Rate: 82 BP: (!) 149/73 Oxygen Therapy SpO2: 100 % Pain: Pain Assessment Pain Scale: 0-10 Pain Score: 0-No pain ADL: ADL ADL Comments: modified Ind with all self care and ADL transfers   Therapy/Group: Individual Therapy  Lashika Erker 04/26/2023, 10:06  AM

## 2023-04-27 ENCOUNTER — Encounter (HOSPITAL_COMMUNITY): Payer: Self-pay

## 2023-04-27 ENCOUNTER — Other Ambulatory Visit (HOSPITAL_COMMUNITY): Payer: Self-pay

## 2023-04-27 DIAGNOSIS — E1149 Type 2 diabetes mellitus with other diabetic neurological complication: Secondary | ICD-10-CM | POA: Diagnosis not present

## 2023-04-27 DIAGNOSIS — H532 Diplopia: Secondary | ICD-10-CM | POA: Diagnosis not present

## 2023-04-27 DIAGNOSIS — E1122 Type 2 diabetes mellitus with diabetic chronic kidney disease: Secondary | ICD-10-CM | POA: Diagnosis not present

## 2023-04-27 DIAGNOSIS — I63311 Cerebral infarction due to thrombosis of right middle cerebral artery: Secondary | ICD-10-CM | POA: Diagnosis not present

## 2023-04-27 LAB — URINALYSIS, ROUTINE W REFLEX MICROSCOPIC
Bilirubin Urine: NEGATIVE
Glucose, UA: NEGATIVE mg/dL
Hgb urine dipstick: NEGATIVE
Ketones, ur: NEGATIVE mg/dL
Leukocytes,Ua: NEGATIVE
Nitrite: NEGATIVE
Protein, ur: NEGATIVE mg/dL
Specific Gravity, Urine: 1.01 (ref 1.005–1.030)
pH: 5 (ref 5.0–8.0)

## 2023-04-27 LAB — CREATININE, URINE, RANDOM: Creatinine, Urine: 66 mg/dL

## 2023-04-27 LAB — SODIUM, URINE, RANDOM: Sodium, Ur: 51 mmol/L

## 2023-04-27 MED ORDER — SODIUM BICARBONATE 650 MG PO TABS
650.0000 mg | ORAL_TABLET | Freq: Three times a day (TID) | ORAL | 0 refills | Status: DC
  Filled 2023-04-27: qty 90, 30d supply, fill #0

## 2023-04-27 MED ORDER — ASPIRIN 81 MG PO TBEC
81.0000 mg | DELAYED_RELEASE_TABLET | Freq: Two times a day (BID) | ORAL | 0 refills | Status: DC
  Filled 2023-04-27: qty 100, 50d supply, fill #0

## 2023-04-27 MED ORDER — MELATONIN 5 MG PO TABS
5.0000 mg | ORAL_TABLET | Freq: Every evening | ORAL | 0 refills | Status: AC | PRN
  Filled 2023-04-27: qty 30, 30d supply, fill #0

## 2023-04-27 MED ORDER — TICAGRELOR 90 MG PO TABS
90.0000 mg | ORAL_TABLET | Freq: Two times a day (BID) | ORAL | 0 refills | Status: DC
  Filled 2023-04-27: qty 60, 30d supply, fill #0

## 2023-04-27 MED ORDER — PANTOPRAZOLE SODIUM 40 MG PO TBEC
40.0000 mg | DELAYED_RELEASE_TABLET | Freq: Two times a day (BID) | ORAL | 0 refills | Status: AC
  Filled 2023-04-27: qty 60, 30d supply, fill #0

## 2023-04-27 MED ORDER — FLUTICASONE PROPIONATE 50 MCG/ACT NA SUSP: 2.0000 | Freq: Every day | NASAL | Status: AC | PRN

## 2023-04-27 NOTE — Plan of Care (Signed)
  Problem: Consults Goal: RH STROKE PATIENT EDUCATION Description: See Patient Education module for education specifics  Outcome: Progressing   Problem: RH BOWEL ELIMINATION Goal: RH STG MANAGE BOWEL WITH ASSISTANCE Description: STG Manage Bowel with toileting Assistance. Outcome: Progressing Goal: RH STG MANAGE BOWEL W/MEDICATION W/ASSISTANCE Description: STG Manage Bowel with Medication with mod I Assistance. Outcome: Progressing   Problem: RH SAFETY Goal: RH STG ADHERE TO SAFETY PRECAUTIONS W/ASSISTANCE/DEVICE Description: STG Adhere to Safety Precautions With cues Assistance/Device. Outcome: Progressing   Problem: RH KNOWLEDGE DEFICIT Goal: RH STG INCREASE KNOWLEDGE OF DIABETES Description: Patient will be able to manage DM with medications and dietary modification using educational resources independently Outcome: Progressing Goal: RH STG INCREASE KNOWLEDGE OF HYPERTENSION Description: Patient will be able to manage HTN with medications and dietary modification using educational resources independently Outcome: Progressing Goal: RH STG INCREASE KNOWLEGDE OF HYPERLIPIDEMIA Description: Patient will be able to manage HLD with medications and dietary modification using educational resources independently Outcome: Progressing Goal: RH STG INCREASE KNOWLEDGE OF STROKE PROPHYLAXIS Description: Patient will be able to manage secondary risks with medications and dietary modification using educational resources independently Outcome: Progressing

## 2023-04-27 NOTE — Progress Notes (Addendum)
PROGRESS NOTE   Subjective/Complaints:  Reviewed labs , discussed that Nephro has been reconsulted   ROS: Patient deniesCP, SOB, N/V/D Objective:   No results found. No results for input(s): "WBC", "HGB", "HCT", "PLT" in the last 72 hours.   Recent Labs    04/25/23 0529 04/26/23 0633  NA 140 138  K 4.1 3.8  CL 113* 111  CO2 21* 21*  GLUCOSE 105* 98  BUN 58* 58*  CREATININE 3.57* 3.89*  CALCIUM 8.8* 8.7*    Intake/Output Summary (Last 24 hours) at 04/27/2023 0835 Last data filed at 04/26/2023 1831 Gross per 24 hour  Intake 480 ml  Output --  Net 480 ml        Physical Exam: Vital Signs Blood pressure 124/62, pulse 73, temperature 98 F (36.7 C), resp. rate 16, height 5\' 2"  (1.575 m), weight 65.1 kg, last menstrual period 05/22/2018, SpO2 100%.    General: No acute distress Mood and affect are appropriate Heart: Regular rate and rhythm no rubs murmurs or extra sounds Lungs: Clear to auscultation, breathing unlabored, no rales or wheezes Abdomen: Positive bowel sounds, soft nontender to palpation, nondistended Extremities: No clubbing, cyanosis, or edema  Skin: No evidence of breakdown, no evidence of rash Neurologic:  right eye eye with reduce adduction when attempting left lateral gaze  + left horz  nystagmus , motor strength is 4/5 in bilateral deltoid, bicep, tricep, grip, hip flexor, knee extensors, ankle dorsiflexor and plantar flexor   Musculoskeletal: Full range of motion in all 4 extremities. No joint swelling    Assessment/Plan: 1. Functional deficits which require 3+ hours per day of interdisciplinary therapy in a comprehensive inpatient rehab setting. Physiatrist is providing close team supervision and 24 hour management of active medical problems listed below. Physiatrist and rehab team continue to assess barriers to discharge/monitor patient progress toward functional and medical  goals  Care Tool:  Bathing    Body parts bathed by patient: Right arm, Left arm, Chest, Abdomen, Front perineal area, Buttocks, Right upper leg, Face, Left lower leg, Right lower leg, Left upper leg         Bathing assist Assist Level: Independent with assistive device     Upper Body Dressing/Undressing Upper body dressing   What is the patient wearing?: Bra, Pull over shirt    Upper body assist Assist Level: Independent    Lower Body Dressing/Undressing Lower body dressing      What is the patient wearing?: Underwear/pull up, Pants     Lower body assist Assist for lower body dressing: Independent with assitive device     Toileting Toileting    Toileting assist Assist for toileting: Independent with assistive device     Transfers Chair/bed transfer  Transfers assist     Chair/bed transfer assist level: Independent with assistive device Chair/bed transfer assistive device: Arboriculturist assist      Assist level: Contact Guard/Touching assist Assistive device: Rollator Max distance: 600 ft   Walk 10 feet activity   Assist     Assist level: Supervision/Verbal cueing Assistive device: Rollator   Walk 50 feet activity   Assist    Assist level:  Contact Guard/Touching assist Assistive device: Rollator    Walk 150 feet activity   Assist    Assist level: Contact Guard/Touching assist Assistive device: Rollator    Walk 10 feet on uneven surface  activity   Assist Walk 10 feet on uneven surfaces activity did not occur: Safety/medical concerns   Assist level: Supervision/Verbal cueing Assistive device: Rollator   Wheelchair     Assist Is the patient using a wheelchair?: No   Wheelchair activity did not occur: N/A         Wheelchair 50 feet with 2 turns activity    Assist    Wheelchair 50 feet with 2 turns activity did not occur: N/A       Wheelchair 150 feet activity      Assist  Wheelchair 150 feet activity did not occur: N/A       Blood pressure 124/62, pulse 73, temperature 98 F (36.7 C), resp. rate 16, height 5\' 2"  (1.575 m), weight 65.1 kg, last menstrual period 05/22/2018, SpO2 100%.  Medical Problem List and Plan: 1. 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  Antithrombotics: -DVT/anticoagulation:  Pharmaceutical: Lovenox             -antiplatelet therapy: ASA/Brilinta.  3. Pain Management: Tylenol for pain prn. 4. Mood/Behavior/Sleep: Melatonin prn.             -antipsychotic agents: LCSW to follow for evaluation and support.  5. Neuropsych/cognition: This patient is capable of making decisions on her own behalf. 6. Skin/Wound Care: Routine pressure relief measures.  7. Fluids/Electrolytes/Nutrition: Monitor I/O. Monitor BMET  8. HTN: Monitor BP TID. On Coreg and amlodipine.              --Continues to be limited by dizziness--monitor for orthostatic changes.              -pt emphatic was using pill packs to do meds/bubble packs and says was compliant with meds? Vitals:   04/26/23 1313 04/26/23 1951  BP: (!) 149/81 124/62  Pulse: 81 73  Resp: 18 16  Temp: 98.2 F (36.8 C) 98 F (36.7 C)  SpO2: 100% 100%  11/12 bp with improvement this am. No changes to regimen 9. T2DM w/retinopathy and neuropathy:  Hgb A1c- 6.1 and well controlled. -- Monitor BS ac/hs and use SSI for elevated BS.  --Was on monjuaro at home CBG (last 3)  No results for  input(s): "GLUCAP" in the last 72 hours.  Has not been receiving semaglutide 0.5mg  [place] q wk, will d/c in hospital but can resume after d/c  10. 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 Limits fluids to 64 ounces otherwise on regular diet             --resume Bicarb for NAGMA.     Latest Ref  Rng & Units 04/26/2023    6:33 AM 04/25/2023    5:29 AM 04/24/2023    5:02 AM  BMP  Glucose 70 - 99 mg/dL 98  130  865   BUN 6 - 20 mg/dL 58  58  54   Creatinine 0.44 - 1.00 mg/dL 7.84  6.96  2.95   Sodium 135 - 145 mmol/L 138  140  143   Potassium 3.5 - 5.1 mmol/L 3.8  4.1  4.1   Chloride 98 - 111 mmol/L 111  113  114   CO2 22 - 32 mmol/L 21  21  21    Calcium 8.9 - 10.3 mg/dL 8.7  8.8  8.9     -encourage fluids Has restriction but only drinking 400-520mL   -rx UTI with keflex Have asked Nephro Dr Glenna Fellows to re eval prior to d/c  11. OSA: Compliant with CPAP 12. H/o Depression: On Prozac--motivated and remains positive.  13. Constipation: will add miralax. If doesn't go by this evening, will add Sorbitol- scared to push  14. Anemia of chronic disease: Monitor H/H with serial checks, Hgb dropped   11/11 Likely acute on chronic component with further drop to 7.2 today   -one of two stool samples guaiac +    -multiple stools yesterday with mucous likely remnants of her emptying out   -iron infusion x 5 per nephrology   GI has evaled will f/u as OP  15. Leucocytosis: Monitor for fevers and other signs of infection.              --recheck WBC in am.      Latest Ref Rng & Units 04/24/2023    5:02 AM 04/23/2023    4:56 AM 04/18/2023    5:31 AM  CBC  WBC 4.0 - 10.5 K/uL 6.4  5.7  6.0   Hemoglobin 12.0 - 15.0 g/dL 7.1  7.2  7.7   Hematocrit 36.0 - 46.0 % 21.9  22.6  23.9   Platelets 150 - 400 K/uL 166  176  190      16.  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  Occ white light at periphery of vision on right side , suspect retinal issues, sees Dr Valere Dross  from optho  for proliferative retinopathy ( also s/p catarct with IOL) will need f/u after discharge   18.  UTI: Keflex was not having dysuria but freq has improved, will have 5 d treatment  LOS: 10 days A FACE TO FACE EVALUATION WAS PERFORMED  Erick Colace 04/27/2023, 8:35 AM

## 2023-04-27 NOTE — Progress Notes (Signed)
   04/26/23 2215  BiPAP/CPAP/SIPAP  $ Non-Invasive Home Ventilator  Subsequent  BiPAP/CPAP/SIPAP Pt Type Adult  BiPAP/CPAP/SIPAP Resmed  Mask Type Nasal pillows  FiO2 (%) 21 %  Patient Home Equipment Yes  Auto Titrate Yes  Safety Check Completed by RT for Home Unit Yes, no issues noted

## 2023-04-27 NOTE — Progress Notes (Addendum)
Physical Therapy Session Note  Patient Details  Name: Christina Orozco MRN: [id number] Date of Birth: 26-Mar-1971  Today's Date: 04/27/2023 PT Individual Time: 9528-4132 PT Individual Time Calculation (min): 55 min   Short Term Goals: Week 2:  PT Short Term Goal 1 (Week 2): STG = LTG d/t ELOS  Skilled Therapeutic Interventions/Progress Updates: Patient supine in bed on entrance to room. Patient alert and agreeable to PT session.   Patient reported no pain at beginning of session. Pt performed all transfers throughout session modI and with rollator (no VC required). Pt ambulated reported some swelling in face with medical team aware, and urine sample taken prior to therapy. Pt ambulated from room<outside of elevators to perform (results bellow) all the way to end of N tower with supervision throughout for safety (no LOB or SOB - pt did need to rest shortly before ambulating back to main gym with supervision). Pt then rested after ambulating another 600'+ back to main gym from N tower, then ambulated further to day room gym to perform NMRE. Pt tossed bean bags to [employer] with CGA/close supervision for safety and no AD (VC to use only R UE and to cross midline to grab bean bags on table). Pt performed 2 rounds with getting more than 50% of the bags on the board at roughly 15' away. Pt reported that this has improved in coordination since previously done last week during therapy. Pt also with no LOB throughout NMRE. Pt ambulated back to room and was left in the bathroom at end of session (pt made modI previous day) with all needs in reach.  6 Min Walk Test:  Instructed patient to ambulate as quickly and as safely as possible for 6 minutes using LRAD. Patient was allowed to take standing rest breaks without stopping the test, but if the patient required a sitting rest break the clock would be stopped and the test would be over.  Results: 627 feet (191.11 meters, Avg speed .583m/s) using a  rollator with supervision. Results indicate that the patient has reduced endurance with ambulation compared to age matched norms.  Age Matched Norms: 43-69 yo M: 6 F: 19, 2-79 yo M: 5 F: 471, 65-89 yo M: 417 F: 392 MDC: 58.21 meters (190.98 feet) or 50 meters (ANPTA Core Set of Outcome Measures for Adults with Neurologic Conditions, 2018)       Therapy Documentation Precautions:  Precautions Precautions: Fall Precaution Comments: chronic mild R ataxia Restrictions Weight Bearing Restrictions: No  Therapy/Group: Individual Therapy  Tori Dattilio PTA  04/27/2023, 3:44 PM

## 2023-04-27 NOTE — Progress Notes (Addendum)
Christina Orozco Progress Note   Reconsulted for this patient today. Briefly, 6F HTN, DM complicated by CKD, recent CVA currently on CIR.  Nephrology followed earlier in admission 11/10 signed off --> Baseline Cr 2.6-2.9, had been up to 3.5 but back to 3.1.  UA poss UTI tx with cephalexin, renal US R renal atrophy.  Thought to just be fluctuating near baseline.    Cr was stable ~3 until yesterday 3.6 and 3.9 today.  No documented hypotension, no UOP documented, no iodinated contrast, no NSAID, no fleets.   She notes normal BMs are q5d but here has been going daily up to 3x/day; no f/c/abd pain.  No frank diarrhea or watery stools.  On PPI but that's a long term med for her.   She's been trying to drink only 64oz/day.    Objective Vitals:   04/26/23 0724 04/26/23 1313 04/26/23 1951 04/27/23 0500  BP: (!) 149/73 (!) 149/81 124/62   Pulse: 82 81 73   Resp:  18 16   Temp:  98.2 F (36.8 C) 98 F (36.7 C)   TempSrc:      SpO2: 100% 100% 100%   Weight:    65.1 kg  Height:       Physical Exam General: eating chicken tenders for lunch, drinking juice Heart: RRR Lungs: clear Abdomen: soft, nontender Extr: No edema Neuro:  nonfocal   Additional Objective Labs: Basic Metabolic Panel: Recent Labs  Lab 04/24/23 0502 04/25/23 0529 04/26/23 0633  NA 143 140 138  K 4.1 4.1 3.8  CL 114* 113* 111  CO2 21* 21* 21*  GLUCOSE 103* 105* 98  BUN 54* 58* 58*  CREATININE 3.11* 3.57* 3.89*  CALCIUM 8.9 8.8* 8.7*  PHOS 4.5 4.5 4.2   Liver Function Tests: Recent Labs  Lab 04/24/23 0502 04/25/23 0529 04/26/23 0633  ALBUMIN 2.9* 2.9* 3.1*   No results for input(s): "LIPASE", "AMYLASE" in the last 168 hours. CBC: Recent Labs  Lab 04/23/23 0456 04/24/23 0502  WBC 5.7 6.4  HGB 7.2* 7.1*  HCT 22.6* 21.9*  MCV 88.6 89.0  PLT 176 166   Blood Culture    Component Value Date/Time   SDES URINE, RANDOM 04/23/2023 0931   SPECREQUEST  04/23/2023 0931    URINE, CLEAN  CATCH Performed at [facility], [address] [place], [place] [address]    CULT 20,000 COLONIES/mL ESCHERICHIA COLI (A) 04/23/2023 0931   REPTSTATUS 04/25/2023 FINAL 04/23/2023 0931    Cardiac Enzymes: No results for input(s): "CKTOTAL", "CKMB", "CKMBINDEX", "TROPONINI" in the last 168 hours. CBG: No results for input(s): "GLUCAP" in the last 168 hours. Iron Studies: No results for input(s): "IRON", "TIBC", "TRANSFERRIN", "FERRITIN" in the last 72 hours. @lablastinr3 @ Studies/Results: No results found. Medications:   amLODipine  10 mg Oral Daily   aspirin EC  81 mg Oral BID   atorvastatin  80 mg Oral QHS   carvedilol  12.5 mg Oral BID WC   enoxaparin (LOVENOX) injection  30 mg Subcutaneous Q24H   FLUoxetine  20 mg Oral Daily   montelukast  10 mg Oral QHS   naphazoline-glycerin  1-2 drop Left Eye BID   pantoprazole  40 mg Oral BID   sodium bicarbonate  650 mg Oral TID   ticagrelor  90 mg Oral BID    Assessment/Plan:  **AKI on CKD 4: underlying CKD attributed to HTN and DM with baseline Cr ~3, eGFR ~18.  Now with rising Cr to 3.9 today.  No obvious  insults or nephrotoxins in preceding days aside from more stools that normal.  ? Mild volume depletion - encouraged oral hydration - confirmed no order for fluid restriction.  Will check bladder scan, urinalysis and urine lytes, strict I/Os.    **HTN:  BPs have been in the 140s recently - not optimal but ok in the short term as we're working through this AKI.  On amlodipie 10, coreg 12.5.   **DM: well controlled A1c 6.1  **CVA: undergoing rehab, plans for d/c tomorrow. On asa, statin, brilinta.   **Metabolic acidosis: on low dose bicarb, continue.   **Anemia:  denies bleeding, rec'd IV iron during admission.  May need outpt ESA if not improving.   Will follow up w studies and AM labs.  Hopefully can d/c tomorrow with outpt f/u.  She sees Dr. Lequita Halt in [facility].  Has appt 05/15/23.   Estill Bakes MD 04/27/2023,  12:07 PM  [facility] Pager: [phone]

## 2023-04-27 NOTE — Progress Notes (Signed)
Inpatient Rehabilitation Care Coordinator Discharge Note   Patient Details  Name: Christina Orozco MRN: [id number] Date of Birth: Oct 03, 1970   Discharge location: Home  Length of Stay: 11 Days  Discharge activity level: MOD I  Home/community participation: Brother, intermittent  Patient response IH:KVQQVZ Literacy - How often do you need to have someone help you when you read instructions, pamphlets, or other written material from your doctor or pharmacy?: Rarely  Patient response DG:LOVFIE Isolation - How often do you feel lonely or isolated from those around you?: Never  Services provided included: SW, Neuropsych, Pharmacy, TR, CM, SLP, RN, OT, PT, RD, MD  Financial Services:  Financial Services Utilized: Private Insurance [facility] Medicare  Choices offered to/list presented to: Patient  Follow-up services arranged:  Home Health Home Health Agency: [facility]         Patient response to transportation need: Is the patient able to respond to transportation needs?: Yes In the past 12 months, has lack of transportation kept you from medical appointments or from getting medications?: No In the past 12 months, has lack of transportation kept you from meetings, work, or from getting things needed for daily living?: No   Patient/Family verbalized understanding of follow-up arrangements:  Yes  Individual responsible for coordination of the follow-up plan: self  Confirmed correct DME delivered: Andria Rhein 04/27/2023    Comments (or additional information):  Summary of Stay    Date/Time Discharge Planning CSW  04/24/23 1402 Discharging home independently with brother to provide intermittent assistance. Barriers: No caregiver support. 1 level home, level entry. Patient has a RW, Rollator, BSC and SPC. CJB       Andria Rhein

## 2023-04-27 NOTE — Progress Notes (Signed)
Patient ID: Christina Orozco, female   DOB: September 11, 1970, 52 y.o.   MRN: [id number]  [facility] referral submitted to [facility]

## 2023-04-27 NOTE — Progress Notes (Signed)
Occupational Therapy Discharge Summary  Patient Details  Name: Christina Orozco MRN: [id number] Date of Birth: 03-08-1971  Date of Discharge from OT service:April 27, 2023   Patient has met 12 of 12 long term goals due to improved activity tolerance, improved balance, ability to compensate for deficits, functional use of  RIGHT upper extremity, and improved coordination.  Patient to discharge at overall Modified Independent level.  Patient's care partner is independent to provide the necessary physical assistance at discharge.  Her brother will provide intermittent A for IADLs.  Reasons goals not met: n/a  Recommendation:  Patient will benefit from ongoing skilled OT services in home health setting to continue to advance functional skills in the area of iADL.  Equipment: No equipment provided  Reasons for discharge: treatment goals met  Patient/family agrees with progress made and goals achieved: Yes  OT Discharge Precautions/Restrictions  Precautions Precautions: Fall Precaution Comments: chronic mild R ataxia Restrictions Weight Bearing Restrictions: No ADL ADL ADL Comments: modified Ind with all self care and ADL transfers Vision Baseline Vision/History: 4 Cataracts;1 Wears glasses (has had cataract surgery on each eye, R eye surgery in summer of 2024) Patient Visual Report: Diplopia;Blurring of vision (improved with occlusion glasses) Ocular Range of Motion: Restricted on the left (limited AROM of L eye for full adduction and abduction, eye deviates into adduction) Alignment/Gaze Preference: Within Defined Limits Tracking/Visual Pursuits: Left eye does not track medially;Left eye does not track laterally;Decreased smoothness of horizontal tracking;Decreased smoothness of vertical tracking;Other (comment) (pt able to track with L eyed but does not have full range of motion) Saccades: Decreased speed of saccadic movement;Additional eye shifts occurred during  testing Visual Fields: No apparent deficits;Other (comment) (pt reports L eye blurriness has improved) Diplopia Assessment: Disappears with one eye closed;Present in near gaze;Present in far gaze;Objects split on top of one another Perception  Perception: Within Functional Limits Praxis Praxis: WFL Cognition Cognition Overall Cognitive Status: Within Functional Limits for tasks assessed Arousal/Alertness: Awake/alert Memory: Appears intact Focused Attention: Appears intact Sustained Attention: Appears intact Awareness: Appears intact Problem Solving: Appears intact Safety/Judgment: Appears intact Brief Interview for Mental Status (BIMS) Repetition of Three Words (First Attempt): 3 Temporal Orientation: Year: Correct Temporal Orientation: Month: Accurate within 5 days Temporal Orientation: Day: Correct Recall: "Sock": Yes, no cue required Recall: "Blue": Yes, no cue required Recall: "Bed": Yes, no cue required BIMS Summary Score: 15 Sensation Sensation Light Touch: Appears Intact Coordination Gross Motor Movements are Fluid and Coordinated: No Fine Motor Movements are Fluid and Coordinated: No Coordination and Movement Description: improved R FMC with functional tasks, now able to  open containers and pick up small objects Heel Shin Test: [facility] Motor  Motor Motor: Ataxia;Other (comment) Motor - Discharge Observations: chronic hemipareisis with improving gross coordination - continued deficits in [facility] Mobility  Bed Mobility Bed Mobility: Rolling Right;Rolling Left;Supine to Sit;Sit to Supine Rolling Right: Independent Rolling Left: Independent Supine to Sit: Independent with assistive device Sit to Supine: Independent with assistive device Transfers Sit to Stand: Independent with assistive device Stand to Sit: Independent with assistive device  Trunk/Postural Assessment  Cervical Assessment Cervical Assessment: Within Functional Limits Thoracic Assessment Thoracic  Assessment: Within Functional Limits Lumbar Assessment Lumbar Assessment: Within Functional Limits Postural Control Postural Control: Deficits on evaluation Righting Reactions: WFL but slow reaction time  Balance Balance Balance Assessed: Yes Standardized Balance Assessment Standardized Balance Assessment: Berg Balance Test;Functional Gait Assessment Berg Balance Test Sit to Stand: Able to stand without using hands and stabilize independently Standing Unsupported:  Able to stand safely 2 minutes Sitting with Back Unsupported but Feet Supported on Floor or Stool: Able to sit safely and securely 2 minutes Stand to Sit: Sits safely with minimal use of hands Transfers: Able to transfer safely, definite need of hands Standing Unsupported with Eyes Closed: Able to stand 10 seconds safely Standing Ubsupported with Feet Together: Able to place feet together independently and stand for 1 minute with supervision From Standing, Reach Forward with Outstretched Arm: Can reach confidently >25 cm (10") From Standing Position, Pick up Object from Floor: Able to pick up shoe, needs supervision From Standing Position, Turn to Look Behind Over each Shoulder: Looks behind from both sides and weight shifts well Turn 360 Degrees: Able to turn 360 degrees safely one side only in 4 seconds or less Standing Unsupported, Alternately Place Feet on Step/Stool: Able to complete >2 steps/needs minimal assist Standing Unsupported, One Foot in Front: Able to plae foot ahead of the other independently and hold 30 seconds Standing on One Leg: Tries to lift leg/unable to hold 3 seconds but remains standing independently Total Score: 45 Dynamic Sitting Balance Dynamic Sitting - Level of Assistance: 7: Independent Static Standing Balance Static Standing - Level of Assistance: 6: Modified independent (Device/Increase time) Dynamic Standing Balance Dynamic Standing - Level of Assistance: 6: Modified independent  (Device/Increase time) Functional Gait  Assessment Gait assessed : Yes Gait Level Surface: Walks 20 ft in less than 7 sec but greater than 5.5 sec, uses assistive device, slower speed, mild gait deviations, or deviates 6-10 in outside of the 12 in walkway width. Change in Gait Speed: Able to smoothly change walking speed without loss of balance or gait deviation. Deviate no more than 6 in outside of the 12 in walkway width. Gait with Horizontal Head Turns: Performs head turns smoothly with slight change in gait velocity (eg, minor disruption to smooth gait path), deviates 6-10 in outside 12 in walkway width, or uses an assistive device. Gait with Vertical Head Turns: Performs task with slight change in gait velocity (eg, minor disruption to smooth gait path), deviates 6 - 10 in outside 12 in walkway width or uses assistive device Gait and Pivot Turn: Turns slowly, requires verbal cueing, or requires several small steps to catch balance following turn and stop Step Over Obstacle: Is able to step over one shoe box (4.5 in total height) but must slow down and adjust steps to clear box safely. May require verbal cueing. Gait with Narrow Base of Support: Ambulates 4-7 steps. Gait with Eyes Closed: Walks 20 ft, uses assistive device, slower speed, mild gait deviations, deviates 6-10 in outside 12 in walkway width. Ambulates 20 ft in less than 9 sec but greater than 7 sec. Ambulating Backwards: Walks 20 ft, uses assistive device, slower speed, mild gait deviations, deviates 6-10 in outside 12 in walkway width. Steps: Alternating feet, must use rail. Total Score: 18 FGA comment:: Pt's scores improve with use of rollator which she uses throughout each day. Extremity/Trunk Assessment RUE Assessment General Strength Comments: 4/5 LUE Assessment General Strength Comments: 4/5   Christina Orozco 04/27/2023, 11:40 AM

## 2023-04-27 NOTE — Plan of Care (Signed)
  Problem: RH Balance Goal: LTG Patient will maintain dynamic standing with ADLs (OT) Description: LTG:  Patient will maintain dynamic standing balance with assist during activities of daily living (OT)  Outcome: Completed/Met   Problem: Sit to Stand Goal: LTG:  Patient will perform sit to stand in prep for activites of daily living with assistance level (OT) Description: LTG:  Patient will perform sit to stand in prep for activites of daily living with assistance level (OT) Outcome: Completed/Met   Problem: RH Eating Goal: LTG Patient will perform eating w/assist, cues/equip (OT) Description: LTG: Patient will perform eating with assist, with/without cues using equipment (OT) Outcome: Completed/Met   Problem: RH Grooming Goal: LTG Patient will perform grooming w/assist,cues/equip (OT) Description: LTG: Patient will perform grooming with assist, with/without cues using equipment (OT) Outcome: Completed/Met   Problem: RH Bathing Goal: LTG Patient will bathe all body parts with assist levels (OT) Description: LTG: Patient will bathe all body parts with assist levels (OT) Outcome: Completed/Met   Problem: RH Dressing Goal: LTG Patient will perform upper body dressing (OT) Description: LTG Patient will perform upper body dressing with assist, with/without cues (OT). Outcome: Completed/Met Goal: LTG Patient will perform lower body dressing w/assist (OT) Description: LTG: Patient will perform lower body dressing with assist, with/without cues in positioning using equipment (OT) Outcome: Completed/Met   Problem: RH Toileting Goal: LTG Patient will perform toileting task (3/3 steps) with assistance level (OT) Description: LTG: Patient will perform toileting task (3/3 steps) with assistance level (OT)  Outcome: Completed/Met   Problem: RH Simple Meal Prep Goal: LTG Patient will perform simple meal prep w/assist (OT) Description: LTG: Patient will perform simple meal prep with  assistance, with/without cues (OT). Outcome: Completed/Met   Problem: RH Light Housekeeping Goal: LTG Patient will perform light housekeeping w/assist (OT) Description: LTG: Patient will perform light housekeeping with assistance, with/without cues (OT). Outcome: Completed/Met   Problem: RH Toilet Transfers Goal: LTG Patient will perform toilet transfers w/assist (OT) Description: LTG: Patient will perform toilet transfers with assist, with/without cues using equipment (OT) Outcome: Completed/Met   Problem: RH Tub/Shower Transfers Goal: LTG Patient will perform tub/shower transfers w/assist (OT) Description: LTG: Patient will perform tub/shower transfers with assist, with/without cues using equipment (OT) Outcome: Completed/Met

## 2023-04-27 NOTE — Progress Notes (Signed)
Physical Therapy Session Note  Patient Details  Name: Christina Orozco MRN: [id number] Date of Birth: April 27, 1971  Today's Date: 04/27/2023 PT Individual Time: 7829-5621 PT Individual Time Calculation (min): 71 min   Short Term Goals: Week 1:  PT Short Term Goal 1 (Week 1): Pt will perform standing transfers with overall supervision. PT Short Term Goal 1 - Progress (Week 1): Met PT Short Term Goal 2 (Week 1): Pt will ambulate at least 200 ft with rollator and supervision. PT Short Term Goal 2 - Progress (Week 1): Met PT Short Term Goal 3 (Week 1): Pt will demonstrate MCID improvement in outcome measure. PT Short Term Goal 3 - Progress (Week 1): Met PT Short Term Goal 4 (Week 1): Pt will demonstrate improvement in maintaining path with minimal deviation during ambulation using rollator. PT Short Term Goal 4 - Progress (Week 1): Met Week 2:  PT Short Term Goal 1 (Week 2): STG = LTG d/t ELOS  Skilled Therapeutic Interventions/Progress Updates:  Patient supine in bed on entrance to room. Noted to be Mod I in room as of yesterday 11/14. Patient alert and agreeable to PT session.   Patient with no pain complaint at start of session. But does relate feeling that face is swollen - nowhere else, just face. MD has ordered UA.   Therapeutic Activity: Bed Mobility: Pt performed supine <> sit with IND/ Mod I using bed rail. No cueing required.  Transfers: Pt performed sit<>stand and stand pivot transfers throughout session with Mod I using rollator. No cueing required. Pt is able to collect clothing for day, collect towels and washcloths, prep floor and shower seat. Pt showers with Mod I using rails and seat to complete safely. Exits bathroom dressed using RW. Puts old clothing away with Mod I.  Gait Training:  Pt ambulated throughout therapy department using rollator with distant supervision/ Mod I. Demonstrated ability to use rollator to perform step up onto 4" curb step and is able to manage  rollator back down off step. Pt relates that brother has invited pt to Thanksgiving at his house. Pt unsure if she will attend but does state that he has 3 steps to enter home with no HR. Educated pt to allow brother to manage RW to top/ bottom of steps and then provide forearm for support to pt's RUE. Is able to perform with supervision/ CGA support of R forearm for pt.   Patient seated on EOB at end of session with brakes locked, no bed alarm set, and all needs within reach.   Therapy Documentation Precautions:  Precautions Precautions: Fall Precaution Comments: mild R ataxia Restrictions Weight Bearing Restrictions: No  Pain:  No pain related this session.    Therapy/Group: Individual Therapy  Loel Dubonnet 04/27/2023, 8:47 AM

## 2023-04-27 NOTE — Discharge Summary (Signed)
Physician Discharge Summary  Patient ID: Christina Orozco MRN: [id number] DOB/AGE: [birth year] 52 y.o.  Admit date: 04/17/2023 Discharge date: 04/28/2023  Discharge Diagnoses:  Principal Problem:   Stroke (cerebrum) [facility]) Active Problems:   Acute on chronic anemia   Chronic kidney disease (CKD), stage IV (severe) (HCC)   Fecal occult blood test positive  High blood pressure  T2DM  OSA H/o Depression    Discharged Condition: stable  Significant Diagnostic Studies: US RENAL  Result Date: 04/21/2023 CLINICAL DATA:  Acute kidney injury. EXAM: RENAL / URINARY TRACT ULTRASOUND COMPLETE COMPARISON:  December 23, 2021 FINDINGS: Right Kidney: Renal measurements: 8.1 cm x 4.0 cm x 4.6 cm = volume: 77 mL. Echogenicity within normal limits. No mass or hydronephrosis visualized. Left Kidney: Renal measurements: 9.0 cm x 5.5 cm x 4.1 cm = volume: 103 mL. Echogenicity within normal limits. No mass or hydronephrosis visualized. Bladder: The urinary bladder is empty and subsequently limited in evaluation. Other: None. IMPRESSION: Findings suggestive of mild interval atrophy of the right kidney since the prior exam. Electronically Signed   By: Aram Candela M.D.   On: 04/21/2023 19:58   Labs:  Basic Metabolic Panel: Recent Labs  Lab 04/24/23 0502 04/25/23 0529 04/26/23 0633 04/28/23 0503  NA 143 140 138 142  K 4.1 4.1 3.8 4.0  CL 114* 113* 111 113*  CO2 21* 21* 21* 22  GLUCOSE 103* 105* 98 101*  BUN 54* 58* 58* 53*  CREATININE 3.11* 3.57* 3.89* 3.40*  CALCIUM 8.9 8.8* 8.7* 9.0  PHOS 4.5 4.5 4.2 3.9    CBC:    Latest Ref Rng & Units 04/24/2023    5:02 AM 04/23/2023    4:56 AM 04/18/2023    5:31 AM  CBC  WBC 4.0 - 10.5 K/uL 6.4  5.7  6.0   Hemoglobin 12.0 - 15.0 g/dL 7.1  7.2  7.7   Hematocrit 36.0 - 46.0 % 21.9  22.6  23.9   Platelets 150 - 400 K/uL 166  176  190      CBG: No results for input(s): "GLUCAP" in the last 168 hours.  Brief HPI:   [name] is a 52 y.o.  RH-female with history HTN, T2DM with retinopathy, neuropathy and nephropathy, MDD, OSA, multiple strokes last 08/30/2022 with right-sided weakness and dysarthria with ongoing outpatient therapy.  She was admitted on 04/13/2023 after syncopal episode with intractable nausea vomiting and double vision left eye.  MRI brain done revealing acute 7 mm cortical infarct in right frontoparietal region and right splenium with severe chronic small vessel disease as well as multiple chronic hemorrhages suggestive of chronic poorly controlled HTN.  Case was discussed with neurology who recommended continuing aspirin, Brilinta and atorvastatin as she was on maximum therapy.  Her GI symptoms had resolved but she continued to be limited by decrease in motor control of RUE as well as distorted vision on the left.  Therapy was working with patient required contact-guard to min assist overall.  CIR was recommended due to functional decline.   Hospital Course: Teressia Jasperson was admitted to rehab 04/17/2023 for inpatient therapies to consist of PT and OT at least three hours five days a week. Past admission physiatrist, therapy team and rehab RN have worked together to provide customized collaborative inpatient rehab.  Her blood pressures were monitored on TID basis and has been stable.  Blood pressures were monitored for orthostatic changes and are stable and dizziness has resolved.Serial check of CBC showed downward trend in H/H as  well as heme positive stools. Dr. Hiram Comber  was consulted for input and  felt that anemia likely chronic in setting of DAPT and lovenox. She received 200 mg IV Venofer X 5 doses. Patient at high risk for anesthesia as well as hold of antiplatelet due to recent stroke. He recommended monitoring of H/H which has been stable without active signs of bleeding. She has been set with GI appointment on 06/14/23 for follow up and input on further testing.  Been compliant with CPAP use.  Her diabetes was  monitored with ac/hs CBG checks and SSI was use prn for tighter BS control.  Sliding scale insulin was used during her stay and she was advised to resume semaglutide after discharge. She  did reported problems with frequency and UA done was negative. She was treated with 5 day course of Keflex for likely UTI with improvement in symptoms. Follow-up labs showed acute on chronic renal failure and Dr. Glenna Fellows with nephrology was consulted for input.  Renal ultrasound done revealing mild interval atrophy without signs of hydronephrosis or obstruction.  Nephrology felt that worsening of renal status was due to mild volume depletion and encouraged fluid intake.  Follow-up check of lites showed serum creatinine to be improving and bladder scan was okay.  She recommended continuing low-dose bicarb and to follow-up with primary nephrologist after discharge.  Made good gains during her rehab stay and is modified independent in supervised setting.  She will continue to receive follow-up home health PT, OT   Rehab course: During patient's stay in rehab weekly team conferences were held to monitor patient's progress, set goals and discuss barriers to discharge. At admission, patient required min assist with mobility and supervision with ADL tasks. She  has had improvement in activity tolerance, balance, postural control as well as ability to compensate for deficits. She is able to complete ADL tasks at modified independent level. She is independent for transfers and to ambulate 600 feet with use of rollator.  She is able to climb 6 stairs with supervision.  Family education has been completed and brother to provide intermittent assistance after discharge.      Discharge disposition: 01-Home or Self Care  Diet: Diabetic.   Special Instructions: Family to assist with medication management. 2. Recommend follow up CBC/BMET in 1-2 weeks to monitor H/H and renal status.    Discharge Instructions     Ambulatory referral  to Neurology   Complete by: As directed    An appointment is requested in approximately: 4 weeks   Ambulatory referral to Physical Medicine Rehab   Complete by: As directed    Hospital follow up      Allergies as of 04/28/2023       Reactions   Lisinopril Swelling, Other (See Comments)   Facial swelling  angioedema   Penicillins Hives   Has patient had a PCN reaction causing immediate rash, facial/tongue/throat swelling, SOB or lightheadedness with hypotension: yes Has patient had a PCN reaction causing severe rash involving mucus membranes or skin necrosis: No Has patient had a PCN reaction that required hospitalization No Has patient had a PCN reaction occurring within the last 10 years: No If all of the above answers are "NO", then may proceed wit Rash on buttock that makes sitting very difficult   Quetiapine Other (See Comments)   Unknown    Gabapentin Swelling, Other (See Comments)   "I thought I was having another stroke." dizziness        Medication List  TAKE these medications    albuterol 108 (90 Base) MCG/ACT inhaler Commonly known as: ProAir HFA INHALE 1 OR 2 PUFFS INTO THE LUNGS EVERY 6 HOURS AS NEEDED FOR WHEEZING OR SHORTNESS OF BREATH   amLODipine 10 MG tablet Commonly known as: NORVASC TAKE 1 TABLET (10 MG TOTAL) BY MOUTH DAILY. (AM)   aspirin EC 81 MG tablet Take 1 tablet (81 mg total) by mouth 2 (two) times daily. Swallow whole.   atorvastatin 80 MG tablet Commonly known as: LIPITOR Take 1 tablet (80 mg total) by mouth at bedtime.   Brilinta 90 MG Tabs tablet Generic drug: ticagrelor Take 1 tablet (90 mg total) by mouth 2 (two) times daily.   carvedilol 12.5 MG tablet Commonly known as: COREG Take 1 tablet (12.5 mg total) by mouth 2 (two) times daily with a meal.   FLUoxetine 20 MG capsule Commonly known as: PROZAC Take 20 mg by mouth daily.   fluticasone 50 MCG/ACT nasal spray Commonly known as: FLONASE Place 2 sprays into both  nostrils daily as needed for allergies or rhinitis. What changed:  when to take this reasons to take this   melatonin 5 MG Tabs Take 1 tablet (5 mg total) by mouth at bedtime as needed.   montelukast 10 MG tablet Commonly known as: SINGULAIR Take 10 mg by mouth at bedtime.   naphazoline-glycerin 0.012-0.25 % Soln Commonly known as: CLEAR EYES REDNESS Place 1-2 drops into the left eye 2 (two) times daily.   Ozempic (0.25 or 0.5 MG/DOSE) 2 MG/3ML Sopn Generic drug: Semaglutide(0.25 or 0.5MG /DOS) Inject 0.5 mg into the skin See admin instructions. Inject 0.5 mg subcutaneous every 10 days   pantoprazole 40 MG tablet Commonly known as: PROTONIX Take 1 tablet (40 mg total) by mouth 2 (two) times daily. What changed: when to take this   sodium bicarbonate 650 MG tablet Take 1 tablet (650 mg total) by mouth 3 (three) times daily. What changed: when to take this        Follow-up Information     Kerin Salen, PA-C. Call.   Specialty: Internal Medicine Why: Monday for post hospital follow up Contact information: [address] [place] [address] [phone]         Erick Colace, MD Follow up.   Specialty: Physical Medicine and Rehabilitation Why: office will call you with follow up appointment Contact information: [address] [place] [address] [phone]         [facility] Follow up.   Why: office will call you with follow up appointment Contact information: [address]     [address] [place] [address] [phone]        Lisette Abu, MD Follow up on 05/24/2023.   Specialty: Nephrology Why: Keep follow up appointment Contact information: [address] [place] [address] [phone]                 Signed: Jacquelynn Cree 04/30/2023, 6:13 PM

## 2023-04-27 NOTE — Progress Notes (Signed)
Patient ID: Christina Orozco, female   DOB: 29-Oct-1970, 52 y.o.   MRN: [id number]  [facility] provided to [facility] for PCS referral. Agency provided of [facility].  Patient does qualify for PCS services.

## 2023-04-27 NOTE — Progress Notes (Signed)
Occupational Therapy Session Note  Patient Details  Name: Christina Orozco MRN: [id number] Date of Birth: 12-19-1970  Today's Date: 04/27/2023 OT Individual Time: 3244-0102 OT Individual Time Calculation (min): 70 min    Short Term Goals: Week 1:  OT Short Term Goal 1 (Week 1): Pt will demonstrate improved R FMC to be able to open frozen food containers and place food items in microwave. OT Short Term Goal 1 - Progress (Week 1): Met OT Short Term Goal 2 (Week 1): Pt will be able to ambulated to bathroom with RW with supervision only. OT Short Term Goal 2 - Progress (Week 1): Met OT Short Term Goal 3 (Week 1): Pt will demonstrate her ability to do diplopia visual exercises on her own without cues. OT Short Term Goal 3 - Progress (Week 1): Met Week 2:  OT Short Term Goal 1 (Week 2): STGs = LTGs  Skilled Therapeutic Interventions/Progress Updates:     Pt received in bed ready for therapy.  Focus of therapy session on L eye visual remediation and activity tolerance with UE coordination.      ADL Retraining: -pt had already completed all self care on her own with Mod I  Therapeutic Activity/ Exercise: -in room, assessed diplopia with Worth 4 dot test. Pt seeing 2 dots in most fields of her vision demonstrating L eye suppression -worked on Psychiatrist with [employer] string -L eye ROM with visual tracking -pencil push ups for convergence -pt reports L eye vision is clearer and occlusion glasses are helping to avoid diplopia  Pt ambulated with 4WW to gym.   Balance: -standing balance at [employer] arm bike on resistance 8 for 5 minutes, seated rest break,  repeated for another 5 minutes.    Pt ambulated back to room with mod Ind. Pt resting in room with all needs met.   Therapy Documentation Precautions:  Precautions Precautions: Fall Precaution Comments: chronic mild R ataxia Restrictions Weight Bearing Restrictions: No   Pain: Pain Assessment Pain Scale:  0-10 Pain Score: 0-No pain ADL: ADL ADL Comments: modified Ind with all self care and ADL transfers Vision Baseline Vision/History: 4 Cataracts;1 Wears glasses (has had cataract surgery on each eye, R eye surgery in summer of 2024) Patient Visual Report: Diplopia;Blurring of vision (improved with occlusion glasses) Ocular Range of Motion: Restricted on the left (limited AROM of L eye for full adduction and abduction, eye deviates into adduction) Alignment/Gaze Preference: Within Defined Limits Tracking/Visual Pursuits: Left eye does not track medially;Left eye does not track laterally;Decreased smoothness of horizontal tracking;Decreased smoothness of vertical tracking;Other (comment) (pt able to track with L eyed but does not have full range of motion) Saccades: Decreased speed of saccadic movement;Additional eye shifts occurred during testing Visual Fields: No apparent deficits;Other (comment) (pt reports L eye blurriness has improved) Diplopia Assessment: Disappears with one eye closed;Present in near gaze;Present in far gaze;Objects split on top of one another Perception  Perception: Within Functional Limits Praxis Praxis: [facility] Balance Balance Balance Assessed: Yes Standardized Balance Assessment Standardized Balance Assessment: Berg Balance Test;Functional Gait Assessment Berg Balance Test Sit to Stand: Able to stand without using hands and stabilize independently Standing Unsupported: Able to stand safely 2 minutes Sitting with Back Unsupported but Feet Supported on Floor or Stool: Able to sit safely and securely 2 minutes Stand to Sit: Sits safely with minimal use of hands Transfers: Able to transfer safely, definite need of hands Standing Unsupported with Eyes Closed: Able to stand 10 seconds safely Standing Ubsupported with Feet  Together: Able to place feet together independently and stand for 1 minute with supervision From Standing, Reach Forward with Outstretched Arm: Can  reach confidently >25 cm (10") From Standing Position, Pick up Object from Floor: Able to pick up shoe, needs supervision From Standing Position, Turn to Look Behind Over each Shoulder: Looks behind from both sides and weight shifts well Turn 360 Degrees: Able to turn 360 degrees safely one side only in 4 seconds or less Standing Unsupported, Alternately Place Feet on Step/Stool: Able to complete >2 steps/needs minimal assist Standing Unsupported, One Foot in Front: Able to plae foot ahead of the other independently and hold 30 seconds Standing on One Leg: Tries to lift leg/unable to hold 3 seconds but remains standing independently Total Score: 45 Dynamic Sitting Balance Dynamic Sitting - Level of Assistance: 7: Independent Static Standing Balance Static Standing - Level of Assistance: 6: Modified independent (Device/Increase time) Dynamic Standing Balance Dynamic Standing - Level of Assistance: 6: Modified independent (Device/Increase time) Functional Gait  Assessment Gait assessed : Yes Gait Level Surface: Walks 20 ft in less than 7 sec but greater than 5.5 sec, uses assistive device, slower speed, mild gait deviations, or deviates 6-10 in outside of the 12 in walkway width. Change in Gait Speed: Able to smoothly change walking speed without loss of balance or gait deviation. Deviate no more than 6 in outside of the 12 in walkway width. Gait with Horizontal Head Turns: Performs head turns smoothly with slight change in gait velocity (eg, minor disruption to smooth gait path), deviates 6-10 in outside 12 in walkway width, or uses an assistive device. Gait with Vertical Head Turns: Performs task with slight change in gait velocity (eg, minor disruption to smooth gait path), deviates 6 - 10 in outside 12 in walkway width or uses assistive device Gait and Pivot Turn: Turns slowly, requires verbal cueing, or requires several small steps to catch balance following turn and stop Step Over Obstacle:  Is able to step over one shoe box (4.5 in total height) but must slow down and adjust steps to clear box safely. May require verbal cueing. Gait with Narrow Base of Support: Ambulates 4-7 steps. Gait with Eyes Closed: Walks 20 ft, uses assistive device, slower speed, mild gait deviations, deviates 6-10 in outside 12 in walkway width. Ambulates 20 ft in less than 9 sec but greater than 7 sec. Ambulating Backwards: Walks 20 ft, uses assistive device, slower speed, mild gait deviations, deviates 6-10 in outside 12 in walkway width. Steps: Alternating feet, must use rail. Total Score: 18 FGA comment:: Pt's scores improve with use of rollator which she uses throughout each day.  Therapy/Group: Individual Therapy  Janann Boeve 04/27/2023, 11:40 AM

## 2023-04-27 NOTE — Progress Notes (Signed)
Physical Therapy Discharge Summary  Patient Details  Name: Christina Orozco MRN: [id number] Date of Birth: [birth year]  Date of Discharge from PT service:April 27, 2023  Today's Date: 04/27/2023  Patient has met 8 of 8 long term goals due to improved activity tolerance, improved balance, increased strength, ability to compensate for deficits, and improved coordination.  Patient to discharge at an ambulatory level Modified Independent in the home.   Patient's care partner  (expected PCA - independent)  to provide the necessary physical and visual  assistance at discharge.  Reasons goals not met: n/a  Recommendation:  Patient will benefit from ongoing skilled PT services in home health setting to continue to advance safe functional mobility, address ongoing impairments in strength, coordination, balance, activity tolerance, cognition, safety awareness, and to minimize fall risk.  Equipment: No equipment provided  Reasons for discharge: treatment goals met and discharge from hospital  Patient/family agrees with progress made and goals achieved: Yes  PT Discharge Precautions/Restrictions Precautions Precautions: Fall Precaution Comments: chronic mild R ataxia Restrictions Weight Bearing Restrictions: No Vital Signs   Pain Pain Assessment Pain Scale: 0-10 Pain Score: 0-No pain Pain Interference Pain Interference Pain Effect on Sleep: 0. Does not apply - I have not had any pain or hurting in the past 5 days Pain Interference with Therapy Activities: 0. Does not apply - I have not received rehabilitationtherapy in the past 5 days Pain Interference with Day-to-Day Activities: 1. Rarely or not at all Vision/Perception  Vision - History Ability to See in Adequate Light: 1 Impaired Perception Perception: Within Functional Limits Praxis Praxis: WFL  Baseline Vision/History: 4 Cataracts;1 Wears glasses (has had cataract surgery on each eye, R eye surgery in summer of  2024) Patient Visual Report: Diplopia;Blurring of vision (improved with occlusion glasses) Ocular Range of Motion: Restricted on the left (limited AROM of L eye for full adduction and abduction, eye deviates into adduction) Alignment/Gaze Preference: Within Defined Limits Tracking/Visual Pursuits: Left eye does not track medially;Left eye does not track laterally;Decreased smoothness of horizontal tracking;Decreased smoothness of vertical tracking;Other (comment) (pt able to track with L eyed but does not have full range of motion) Saccades: Decreased speed of saccadic movement;Additional eye shifts occurred during testing Visual Fields: No apparent deficits;Other (comment) (pt reports L eye blurriness has improved) Diplopia Assessment: Disappears with one eye closed;Present in near gaze;Present in far gaze;Objects split on top of one another Cognition Overall Cognitive Status: Within Functional Limits for tasks assessed Arousal/Alertness: Awake/alert Orientation Level: Oriented X4 Focused Attention: Appears intact Sustained Attention: Appears intact Memory: Appears intact Awareness: Appears intact Problem Solving: Appears intact Safety/Judgment: Appears intact Sensation Sensation Light Touch: Appears Intact Coordination Gross Motor Movements are Fluid and Coordinated: No Fine Motor Movements are Fluid and Coordinated: No Coordination and Movement Description: 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 which is causing her to have difficulty with writing, opening containers Heel Shin Test: [facility] Motor  Motor Motor: Ataxia;Other (comment) Motor - Discharge Observations: chronic hemipareisis with improving gross coordination - continued deficits in [facility]  Mobility Bed Mobility Bed Mobility: Rolling Right;Rolling Left;Supine to Sit;Sit to Supine Rolling Right: Independent Rolling Left: Independent Supine to Sit: Independent with assistive device Sit to Supine: Independent with  assistive device Transfers Transfers: Sit to Stand;Stand to Sit;Stand Pivot Transfers Sit to Stand: Independent with assistive device Stand to Sit: Independent with assistive device Stand Pivot Transfers: Independent with assistive device Transfer (Assistive device): Rollator Locomotion  Gait Ambulation: Yes Gait Assistance: Independent with assistive  device Gait Distance (Feet): 600 Feet Assistive device: Rollator Gait Gait: Yes Gait Pattern: Impaired Gait Pattern: Decreased stance time - right Gait velocity: decreased Stairs / Additional Locomotion Stairs: Yes Stairs Assistance: Supervision/Verbal cueing Stair Management Technique: Two rails Number of Stairs: 12 Height of Stairs: 6 Ramp: Independent with assistive device Wheelchair Mobility Wheelchair Mobility: No  Trunk/Postural Assessment  Cervical Assessment Cervical Assessment: Within Functional Limits Thoracic Assessment Thoracic Assessment: Within Functional Limits Lumbar Assessment Lumbar Assessment: Within Functional Limits Postural Control Postural Control: Deficits on evaluation Righting Reactions: WFL but slow reaction time  Balance Balance Balance Assessed: Yes Standardized Balance Assessment Standardized Balance Assessment: Berg Balance Test;Functional Gait Assessment Berg Balance Test Sit to Stand: Able to stand without using hands and stabilize independently Standing Unsupported: Able to stand safely 2 minutes Sitting with Back Unsupported but Feet Supported on Floor or Stool: Able to sit safely and securely 2 minutes Stand to Sit: Sits safely with minimal use of hands Transfers: Able to transfer safely, definite need of hands Standing Unsupported with Eyes Closed: Able to stand 10 seconds safely Standing Ubsupported with Feet Together: Able to place feet together independently and stand for 1 minute with supervision From Standing, Reach Forward with Outstretched Arm: Can reach confidently >25 cm  (10") From Standing Position, Pick up Object from Floor: Able to pick up shoe, needs supervision From Standing Position, Turn to Look Behind Over each Shoulder: Looks behind from both sides and weight shifts well Turn 360 Degrees: Able to turn 360 degrees safely one side only in 4 seconds or less Standing Unsupported, Alternately Place Feet on Step/Stool: Able to complete >2 steps/needs minimal assist Standing Unsupported, One Foot in Front: Able to plae foot ahead of the other independently and hold 30 seconds Standing on One Leg: Tries to lift leg/unable to hold 3 seconds but remains standing independently Total Score: 45 Dynamic Sitting Balance Dynamic Sitting - Level of Assistance: 7: Independent Static Standing Balance Static Standing - Level of Assistance: 6: Modified independent (Device/Increase time) Dynamic Standing Balance Dynamic Standing - Level of Assistance: 6: Modified independent (Device/Increase time) Functional Gait  Assessment Gait assessed : Yes Gait Level Surface: Walks 20 ft in less than 7 sec but greater than 5.5 sec, uses assistive device, slower speed, mild gait deviations, or deviates 6-10 in outside of the 12 in walkway width. Change in Gait Speed: Able to smoothly change walking speed without loss of balance or gait deviation. Deviate no more than 6 in outside of the 12 in walkway width. Gait with Horizontal Head Turns: Performs head turns smoothly with slight change in gait velocity (eg, minor disruption to smooth gait path), deviates 6-10 in outside 12 in walkway width, or uses an assistive device. Gait with Vertical Head Turns: Performs task with slight change in gait velocity (eg, minor disruption to smooth gait path), deviates 6 - 10 in outside 12 in walkway width or uses assistive device Gait and Pivot Turn: Turns slowly, requires verbal cueing, or requires several small steps to catch balance following turn and stop Step Over Obstacle: Is able to step over one  shoe box (4.5 in total height) but must slow down and adjust steps to clear box safely. May require verbal cueing. Gait with Narrow Base of Support: Ambulates 4-7 steps. Gait with Eyes Closed: Walks 20 ft, uses assistive device, slower speed, mild gait deviations, deviates 6-10 in outside 12 in walkway width. Ambulates 20 ft in less than 9 sec but greater than 7 sec. Ambulating  Backwards: Walks 20 ft, uses assistive device, slower speed, mild gait deviations, deviates 6-10 in outside 12 in walkway width. Steps: Alternating feet, must use rail. Total Score: 18 FGA comment:: Pt's scores improve with use of rollator which she uses throughout each day. Extremity Assessment      RLE Assessment RLE Assessment: Within Functional Limits General Strength Comments: grossly 4+/ 5 prox to dist LLE Assessment LLE Assessment: Within Functional Limits General Strength Comments: grossly 4/ 5 prox to dist   Loel Dubonnet PT, DPT, CSRS 04/27/2023, 8:48 AM

## 2023-04-28 DIAGNOSIS — N179 Acute kidney failure, unspecified: Secondary | ICD-10-CM | POA: Diagnosis not present

## 2023-04-28 DIAGNOSIS — I10 Essential (primary) hypertension: Secondary | ICD-10-CM | POA: Diagnosis not present

## 2023-04-28 DIAGNOSIS — I63311 Cerebral infarction due to thrombosis of right middle cerebral artery: Secondary | ICD-10-CM | POA: Diagnosis not present

## 2023-04-28 LAB — RENAL FUNCTION PANEL
Albumin: 3.1 g/dL — ABNORMAL LOW (ref 3.5–5.0)
Anion gap: 7 (ref 5–15)
BUN: 53 mg/dL — ABNORMAL HIGH (ref 6–20)
CO2: 22 mmol/L (ref 22–32)
Calcium: 9 mg/dL (ref 8.9–10.3)
Chloride: 113 mmol/L — ABNORMAL HIGH (ref 98–111)
Creatinine, Ser: 3.4 mg/dL — ABNORMAL HIGH (ref 0.44–1.00)
GFR, Estimated: 16 mL/min — ABNORMAL LOW (ref >60)
Glucose, Bld: 101 mg/dL — ABNORMAL HIGH (ref 70–99)
Phosphorus: 3.9 mg/dL (ref 2.5–4.6)
Potassium: 4 mmol/L (ref 3.5–5.1)
Sodium: 142 mmol/L (ref 135–145)

## 2023-04-28 NOTE — Plan of Care (Signed)
  Problem: RH Balance Goal: LTG Patient will maintain dynamic standing balance (PT) Description: LTG:  Patient will maintain dynamic standing balance with assistance during mobility activities (PT) 04/28/2023 0947 by Loel Dubonnet, PT Outcome: Completed/Met 04/28/2023 0945 by Loel Dubonnet, PT Flowsheets (Taken 04/18/2023 1716) LTG: Pt will maintain dynamic standing balance during mobility activities with:: Independent with assistive device    Problem: Sit to Stand Goal: LTG:  Patient will perform sit to stand with assistance level (PT) Description: LTG:  Patient will perform sit to stand with assistance level (PT) 04/28/2023 0947 by Loel Dubonnet, PT Outcome: Completed/Met 04/28/2023 0945 by Loel Dubonnet, PT Flowsheets (Taken 04/18/2023 1716) LTG: PT will perform sit to stand in preparation for functional mobility with assistance level: Independent with assistive device   Problem: RH Car Transfers Goal: LTG Patient will perform car transfers with assist (PT) Description: LTG: Patient will perform car transfers with assistance (PT). 04/28/2023 0947 by Loel Dubonnet, PT Outcome: Completed/Met 04/28/2023 0945 by Loel Dubonnet, PT Flowsheets (Taken 04/18/2023 1716) LTG: Pt will perform car transfers with assist:: Supervision/Verbal cueing   Problem: RH Furniture Transfers Goal: LTG Patient will perform furniture transfers w/assist (OT/PT) Description: LTG: Patient will perform furniture transfers  with assistance (OT/PT). 04/28/2023 0947 by Loel Dubonnet, PT Outcome: Completed/Met 04/28/2023 0945 by Loel Dubonnet, PT Flowsheets (Taken 04/18/2023 1716) LTG: Pt will perform furniture transfers with assist:: Independent with assistive device    Problem: RH Ambulation Goal: LTG Patient will ambulate in home environment (PT) Description: LTG: Patient will ambulate in home environment, # of feet with assistance (PT). 04/28/2023 0947 by Loel Dubonnet, PT Outcome:  Completed/Met 04/28/2023 0945 by Loel Dubonnet, PT Flowsheets (Taken 04/18/2023 1716) LTG: Pt will ambulate in home environ  assist needed:: Independent with assistive device LTG: Ambulation distance in home environment: up to 50 ft using LRAD Goal: LTG Patient will ambulate in community environment (PT) Description: LTG: Patient will ambulate in community environment, # of feet with assistance (PT). 04/28/2023 0947 by Loel Dubonnet, PT Outcome: Completed/Met 04/28/2023 0945 by Loel Dubonnet, PT Flowsheets (Taken 04/18/2023 1716) LTG: Pt will ambulate in community environ  assist needed:: Supervision/Verbal cueing LTG: Ambulation distance in community environment: >400 ft using LRAD   Problem: RH Stairs Goal: LTG Patient will ambulate up and down stairs w/assist (PT) Description: LTG: Patient will ambulate up and down # of stairs with assistance (PT) 04/28/2023 0947 by Loel Dubonnet, PT Outcome: Completed/Met 04/28/2023 0945 by Loel Dubonnet, PT Flowsheets (Taken 04/18/2023 1716) LTG: Pt will ambulate up/down stairs assist needed:: Supervision/Verbal cueing LTG: Pt will  ambulate up and down number of stairs: at least 4 steps using HR setup as per community environment in order to utilize public transport and mobilize in community Note: Supervision when hand holds are present   Problem: RH Bed to Chair Transfers Goal: LTG Patient will perform bed/chair transfers w/assist (PT) Description: LTG: Patient will perform bed to chair transfers with assistance (PT). 04/28/2023 0947 by Loel Dubonnet, PT Outcome: Completed/Met 04/28/2023 0945 by Loel Dubonnet, PT Flowsheets (Taken 04/18/2023 1716) LTG: Pt will perform Bed to Chair Transfers with assistance level: Independent with assistive device

## 2023-04-28 NOTE — Progress Notes (Signed)
Inpatient Rehabilitation Discharge Medication Review by a Pharmacist  A complete drug regimen review was completed for this patient to identify any potential clinically significant medication issues.  High Risk Drug Classes Is patient taking? Indication by Medication  Antipsychotic No   Anticoagulant No   Antibiotic No   Opioid No   Antiplatelet Yes Asprin 81 mg BID and Ticagrelor - stroke prophylaxis  Hypoglycemics/insulin Yes Semaglutide - diabetes Type 2  Vasoactive Medication Yes Amlodipine, carvedilol - hypertension  Chemotherapy No   Other Yes Atorvastatin - hyperlipidemia Fluoxetine - depression Montelukast - asthma Pantoprazole - GERD Sodium bicarbonate - acidosis Clear Eyes Redness - dry eyes  PRNs: Albuterol inhaler - wheezing, shortness of breath Fluticasone - allergies, rhinitis Melatonin - sleep     Type of Medication Issue Identified Description of Issue Recommendation(s)  Drug Interaction(s) (clinically significant)     Duplicate Therapy     Allergy     No Medication Administration End Date     Incorrect Dose     Additional Drug Therapy Needed     Significant med changes from prior encounter (inform family/care partners about these prior to discharge).    Other       Clinically significant medication issues were identified that warrant physician communication and completion of prescribed/recommended actions by midnight of the next day:  No  Pharmacist comments:  - Aspirin 81 mg BID since May, noted per Neurology.  Time spent performing this drug regimen review (minutes):  20   Dennie Fetters, [place] 04/28/2023 9:08 AM

## 2023-04-28 NOTE — Progress Notes (Signed)
PROGRESS NOTE   Subjective/Complaints:  Pt doing well, ready to go home. Slept ok, pain controlled, LBM yesterday, urinating fine. Just waiting on nephrology to come by and see her before d/c. Meds reviewed. Denies any other complaints or concerns today.   ROS: Patient denies CP, SOB, abd pain, N/V/D/C   Objective:   No results found. No results for input(s): "WBC", "HGB", "HCT", "PLT" in the last 72 hours.   Recent Labs    04/26/23 0633 04/28/23 0503  NA 138 142  K 3.8 4.0  CL 111 113*  CO2 21* 22  GLUCOSE 98 101*  BUN 58* 53*  CREATININE 3.89* 3.40*  CALCIUM 8.7* 9.0    Intake/Output Summary (Last 24 hours) at 04/28/2023 1003 Last data filed at 04/27/2023 2000 Gross per 24 hour  Intake 358 ml  Output 350 ml  Net 8 ml        Physical Exam: Vital Signs Blood pressure 139/71, pulse 76, temperature 98.1 F (36.7 C), temperature source Oral, resp. rate 18, height 5\' 2"  (1.575 m), weight 64.9 kg, last menstrual period 05/22/2018, SpO2 100%.    General: No acute distress, sitting at EOB Mood and affect are appropriate Heart: Regular rate and rhythm no rubs murmurs or extra sounds Lungs: Clear to auscultation, breathing unlabored, no rales or wheezes Abdomen: Positive bowel sounds, soft nontender to palpation, nondistended Extremities: No clubbing, cyanosis, or edema  PRIOR EXAM: Skin: No evidence of breakdown, no evidence of rash Neurologic:  right eye eye with reduce adduction when attempting left lateral gaze  + left horz  nystagmus , motor strength is 4/5 in bilateral deltoid, bicep, tricep, grip, hip flexor, knee extensors, ankle dorsiflexor and plantar flexor   Musculoskeletal: Full range of motion in all 4 extremities. No joint swelling    Assessment/Plan: 1. Functional deficits which require 3+ hours per day of interdisciplinary therapy in a comprehensive inpatient rehab setting. Physiatrist is  providing close team supervision and 24 hour management of active medical problems listed below. Physiatrist and rehab team continue to assess barriers to discharge/monitor patient progress toward functional and medical goals  Care Tool:  Bathing    Body parts bathed by patient: Right arm, Left arm, Chest, Abdomen, Front perineal area, Buttocks, Right upper leg, Face, Left lower leg, Right lower leg, Left upper leg         Bathing assist Assist Level: Independent with assistive device     Upper Body Dressing/Undressing Upper body dressing   What is the patient wearing?: Bra, Pull over shirt    Upper body assist Assist Level: Independent    Lower Body Dressing/Undressing Lower body dressing      What is the patient wearing?: Underwear/pull up, Pants     Lower body assist Assist for lower body dressing: Independent with assitive device     Toileting Toileting    Toileting assist Assist for toileting: Independent with assistive device     Transfers Chair/bed transfer  Transfers assist     Chair/bed transfer assist level: Independent with assistive device Chair/bed transfer assistive device: Other (rollator)   Locomotion Ambulation   Ambulation assist      Assist level: Supervision/Verbal cueing  Assistive device: Rollator Max distance: 600 ft   Walk 10 feet activity   Assist     Assist level: Independent with assistive device Assistive device: Rollator   Walk 50 feet activity   Assist    Assist level: Independent with assistive device Assistive device: Rollator    Walk 150 feet activity   Assist    Assist level: Supervision/Verbal cueing Assistive device: Rollator    Walk 10 feet on uneven surface  activity   Assist Walk 10 feet on uneven surfaces activity did not occur: Safety/medical concerns   Assist level: Supervision/Verbal cueing Assistive device: Rollator   Wheelchair     Assist Is the patient using a wheelchair?:  No   Wheelchair activity did not occur: N/A         Wheelchair 50 feet with 2 turns activity    Assist    Wheelchair 50 feet with 2 turns activity did not occur: N/A       Wheelchair 150 feet activity     Assist  Wheelchair 150 feet activity did not occur: N/A       Blood pressure 139/71, pulse 76, temperature 98.1 F (36.7 C), temperature source Oral, resp. rate 18, height 5\' 2"  (1.575 m), weight 64.9 kg, last menstrual period 05/22/2018, SpO2 100%.  Medical Problem List and Plan: 1. 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  2.  Antithrombotics: -DVT/anticoagulation:  Pharmaceutical: Lovenox             -antiplatelet therapy: ASA/Brilinta.  3. Pain Management: Tylenol for pain prn. 4. Mood/Behavior/Sleep: Melatonin prn.             -antipsychotic agents: LCSW to follow for evaluation and support.  5. Neuropsych/cognition: This patient is capable of making decisions on her own behalf. 6. Skin/Wound Care: Routine pressure relief measures.  7. Fluids/Electrolytes/Nutrition: Monitor I/O. Monitor BMET  8. HTN: Monitor BP TID. On Coreg and amlodipine.              --Continues to be limited by dizziness--monitor for orthostatic changes.              -pt emphatic was using pill packs to do meds/bubble packs and says was compliant with meds? Vitals:   04/27/23 1916  04/28/23 0421  BP: 138/78 139/71  Pulse: 78 76  Resp: 18 18  Temp: 97.9 F (36.6 C) 98.1 F (36.7 C)  SpO2: 100% 100%  11/12 bp with improvement this am. No changes to regimen  -04/28/23 BPs stable, cont regimen 9. T2DM w/retinopathy and neuropathy:  Hgb A1c- 6.1 and well controlled. -- Monitor BS ac/hs and use SSI for elevated BS.  --Was on monjuaro at home CBG (  last 3)  No results for input(s): "GLUCAP" in the last 72 hours.  Has not been receiving semaglutide 0.5mg  [place] q wk, will d/c in hospital but can resume after d/c  10. 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 Limits fluids to 64 ounces otherwise on regular diet             --resume Bicarb for NAGMA.     Latest Ref Rng & Units 04/28/2023    5:03 AM 04/26/2023    6:33 AM 04/25/2023    5:29 AM  BMP  Glucose 70 - 99 mg/dL 161  98  096   BUN 6 - 20 mg/dL 53  58  58   Creatinine 0.44 - 1.00 mg/dL 0.45  4.09  8.11   Sodium 135 - 145 mmol/L 142  138  140   Potassium 3.5 - 5.1 mmol/L 4.0  3.8  4.1   Chloride 98 - 111 mmol/L 113  111  113   CO2 22 - 32 mmol/L 22  21  21    Calcium 8.9 - 10.3 mg/dL 9.0  8.7  8.8     -encourage fluids Has restriction but only drinking 400-567mL   -rx UTI with keflex Have asked Nephro Dr Glenna Fellows to re eval prior to d/c  -04/28/23 Cr improving today, down to 3.4; awaiting nephrology to see her today; appreciate their assistance.   11. OSA: Compliant with CPAP 12. H/o Depression: On Prozac--motivated and remains positive.  13. Constipation: will add miralax. If doesn't go by this evening, will add Sorbitol- scared to push  14. Anemia of chronic disease: Monitor H/H with serial checks, Hgb dropped   11/11 Likely acute on chronic component with further drop to 7.2 today   -one of two stool samples guaiac +    -multiple stools yesterday with mucous likely remnants of her emptying out   -iron infusion x 5 per nephrology   GI has evaled will f/u as OP  15. Leucocytosis: Monitor for fevers and other  signs of infection.              --recheck WBC in am.  -04/28/23 resolved      Latest Ref Rng & Units 04/24/2023    5:02 AM 04/23/2023    4:56 AM 04/18/2023    5:31 AM  CBC  WBC 4.0 - 10.5 K/uL 6.4  5.7  6.0   Hemoglobin 12.0 - 15.0 g/dL 7.1  7.2  7.7   Hematocrit 36.0 - 46.0 % 21.9  22.6  23.9   Platelets 150 - 400 K/uL 166  176  190      16.  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  Occ white light at periphery of vision on right side , suspect retinal issues, sees Dr Valere Dross  from optho  for proliferative retinopathy ( also s/p catarct with IOL) will need f/u after discharge   18.  UTI: Keflex was not having dysuria but freq has improved, will have 5 d treatment   -04/28/23 U/A yesterday unremarkable, no evidence of ongoing UTI LOS: 11 days A FACE TO FACE EVALUATION WAS PERFORMED  [address] 04/28/2023, 10:03 AM

## 2023-04-28 NOTE — Progress Notes (Signed)
[facility] Progress Note   Subjective:  feeling fine today.  For d/c today.  Objective Vitals:   04/27/23 1541 04/27/23 1916 04/28/23 0418 04/28/23 0421  BP: (!) 150/70 138/78  139/71  Pulse: 81 78  76  Resp: 16 18  18   Temp: 98.4 F (36.9 C) 97.9 F (36.6 C)  98.1 F (36.7 C)  TempSrc:    Oral  SpO2: 100% 100%  100%  Weight:   64.9 kg   Height:       Physical Exam General: dressed for d/c looking well Heart: RRR Lungs: clear Abdomen: soft, nontender Extr: No edema Neuro:  nonfocal,  mildly dysarthric  Additional Objective Labs: Basic Metabolic Panel: Recent Labs  Lab 04/25/23 0529 04/26/23 0633 04/28/23 0503  NA 140 138 142  K 4.1 3.8 4.0  CL 113* 111 113*  CO2 21* 21* 22  GLUCOSE 105* 98 101*  BUN 58* 58* 53*  CREATININE 3.57* 3.89* 3.40*  CALCIUM 8.8* 8.7* 9.0  PHOS 4.5 4.2 3.9   Liver Function Tests: Recent Labs  Lab 04/25/23 0529 04/26/23 0633 04/28/23 0503  ALBUMIN 2.9* 3.1* 3.1*   No results for input(s): "LIPASE", "AMYLASE" in the last 168 hours. CBC: Recent Labs  Lab 04/23/23 0456 04/24/23 0502  WBC 5.7 6.4  HGB 7.2* 7.1*  HCT 22.6* 21.9*  MCV 88.6 89.0  PLT 176 166   Blood Culture    Component Value Date/Time   SDES URINE, RANDOM 04/23/2023 0931   SPECREQUEST  04/23/2023 0931    URINE, CLEAN CATCH Performed at [facility], [address], [place], [place] [address]    CULT 20,000 COLONIES/mL ESCHERICHIA COLI (A) 04/23/2023 0931   REPTSTATUS 04/25/2023 FINAL 04/23/2023 0931    Cardiac Enzymes: No results for input(s): "CKTOTAL", "CKMB", "CKMBINDEX", "TROPONINI" in the last 168 hours. CBG: No results for input(s): "GLUCAP" in the last 168 hours. Iron Studies: No results for input(s): "IRON", "TIBC", "TRANSFERRIN", "FERRITIN" in the last 72 hours. @lablastinr3 @ Studies/Results: No results found. Medications:   amLODipine  10 mg Oral Daily   aspirin EC  81 mg Oral BID   atorvastatin  80 mg Oral  QHS   carvedilol  12.5 mg Oral BID WC   enoxaparin (LOVENOX) injection  30 mg Subcutaneous Q24H   FLUoxetine  20 mg Oral Daily   montelukast  10 mg Oral QHS   naphazoline-glycerin  1-2 drop Left Eye BID   pantoprazole  40 mg Oral BID   sodium bicarbonate  650 mg Oral TID   ticagrelor  90 mg Oral BID    Assessment/Plan:  **AKI on CKD 4: underlying CKD attributed to HTN and DM with baseline Cr ~3, eGFR ~18.  Now with rising Cr to 3.9 11/15.  No obvious insults or nephrotoxins in preceding days aside from more stools that normal.  Suspect mild volume depletion - encouraged oral hydration and Cr improved to 3.4 today  UA unrevealing, urine lytes FeNa 2.2% but wasn't technically oliguric so less helpful.  Bladder scan ok.      **HTN:  BPs 130s this AM.  On amlodipine 10, coreg 12.5.  Acceptable  **DM: well controlled A1c 6.1  **CVA: undergoing rehab, plans for d/c soon. On asa, statin, brilinta.   **Metabolic acidosis: on low dose bicarb, continue.   **Anemia:  denies bleeding, rec'd IV iron during admission.  May need outpt ESA if not improving.   Nothing further to add, will sign off.  OK for d/c.  She sees Dr. Lequita Halt in [facility]  Has appt 05/15/23.   Estill Bakes MD 04/28/2023, 10:20 AM  [facility] Pager: [phone]

## 2023-04-30 ENCOUNTER — Encounter: Payer: 59 | Admitting: Occupational Therapy

## 2023-05-03 ENCOUNTER — Encounter: Payer: 59 | Admitting: Occupational Therapy

## 2023-05-07 ENCOUNTER — Encounter: Payer: 59 | Admitting: Occupational Therapy

## 2023-05-07 ENCOUNTER — Telehealth: Payer: Self-pay | Admitting: Cardiovascular Disease

## 2023-05-07 NOTE — Telephone Encounter (Signed)
Office calling for home health physical therapy to continue the therapy. Please advise

## 2023-05-07 NOTE — Telephone Encounter (Signed)
[facility] called requesting an extension for home health PT & OT. Per chart review, orders previous ordered by pt's pcp Venia Minks, PA-C. Home Health facility made aware and instructed to contact their office. Facility rep verbalized understanding.

## 2023-05-08 ENCOUNTER — Encounter: Payer: 59 | Attending: Psychology | Admitting: Registered Nurse

## 2023-05-08 VITALS — BP 146/87 | HR 70 | Ht 62.0 in | Wt 139.0 lb

## 2023-05-08 DIAGNOSIS — I10 Essential (primary) hypertension: Secondary | ICD-10-CM | POA: Insufficient documentation

## 2023-05-08 DIAGNOSIS — I639 Cerebral infarction, unspecified: Secondary | ICD-10-CM | POA: Diagnosis present

## 2023-05-08 NOTE — Progress Notes (Unsigned)
Subjective:    Patient ID: Christina Orozco, female    DOB: [birth year], 52 y.o.   MRN: [id number]  HPI: Christina Orozco is a 52 y.o. female who returns for follow up appointment for chronic pain and medication refill. states *** pain is located in  ***. rates pain ***. current exercise regime is walking and performing stretching exercises.    Pain Inventory Average Pain 0 Pain Right Now 0 My pain is  no pain  LOCATION OF PAIN  no pain  BOWEL Number of stools per week: 4 Oral laxative use No  Type of laxative . Enema or suppository use No  History of colostomy No  Incontinent No   BLADDER Pads In and out cath, frequency . Able to self cath  . Bladder incontinence No  Frequent urination No  Leakage with coughing No  Difficulty starting stream No  Incomplete bladder emptying No    Mobility walk with assistance use a walker how many minutes can you walk? unsure ability to climb steps?  no do you drive?  no  Function disabled: date disabled .  Neuro/Psych bladder control problems weakness tremor trouble walking anxiety  Prior Studies TC appt  Physicians involved in your care TC appt   Family History  Problem Relation Age of Onset   Vascular Disease Mother    CAD Mother    Heart failure Mother    Heart disease Other    Cancer Other        colon, stomach, pancreatic, lung   Diabetes Other    Breast cancer Sister 30   Seizures Maternal Uncle    Social History   Socioeconomic History   Marital status: Single    Spouse name: Not on file   Number of children: 3   Years of education: 12+   Highest education level: Some college, no degree  Occupational History   Not on file  Tobacco Use   Smoking status: Never   Smokeless tobacco: Never  Vaping Use   Vaping status: Never Used  Substance and Sexual Activity   Alcohol use: No   Drug use: No   Sexual activity: Not Currently  Other Topics Concern   Not on file  Social History Narrative    Lives with daughter   Caffeine use: "once in a blue moon"   Right handed   Social Determinants of Health   Financial Resource Strain: Medium Risk (09/06/2022)   Received from [facility], [facility]   Overall Financial Resource Strain (CARDIA)    Difficulty of Paying Living Expenses: Somewhat hard  Food Insecurity: Low Risk  (02/27/2023)   Received from [facility]   Hunger Vital Sign    Worried About Running Out of Food in the Last Year: Never true    Ran Out of Food in the Last Year: Never true  Transportation Needs: No Transportation Needs (02/27/2023)   Received from [facility]    In the past 12 months, has lack of reliable transportation kept you from medical appointments, meetings, work or from getting things needed for daily living? : No  Physical Activity: Not on file  Stress: Not on file  Social Connections: Unknown (10/10/2021)   Received from [facility], [facility]   Social Network    Social Network: Not on file   Past Surgical History:  Procedure Laterality Date   ADENOIDECTOMY     APPENDECTOMY     CERVICAL SPINE SURGERY  06/2012   C5-C6  EYE SURGERY     left eye surgery    FOOT SURGERY     KENALOG INJECTION Left 03/10/2021   Procedure: Intravitreal Avastin Injection;  Surgeon: Stephannie Li, MD;  Location: [facility] OR;  Service: Ophthalmology;  Laterality: Left;   LOOP RECORDER INSERTION N/A 08/27/2017   Procedure: LOOP RECORDER INSERTION;  Surgeon: Thurmon Fair, MD;  Location: [facility];  Service: Cardiovascular;  Laterality: N/A;   MEMBRANE PEEL Left 03/10/2021   Procedure: MEMBRANE PEEL;  Surgeon: Stephannie Li, MD;  Location: [facility] OR;  Service: Ophthalmology;  Laterality: Left;   PARS PLANA VITRECTOMY Left 03/10/2021   Procedure: TWENTY-FIVE GAUGE PARS PLANA VITRECTOMY;  Surgeon: Stephannie Li, MD;  Location: [facility] OR;  Service: Ophthalmology;  Laterality: Left;   PHOTOCOAGULATION WITH LASER Left 03/10/2021   Procedure:  PHOTOCOAGULATION WITH LASER;  Surgeon: Stephannie Li, MD;  Location: [facility] OR;  Service: Ophthalmology;  Laterality: Left;   SHOULDER SURGERY     TEE WITHOUT CARDIOVERSION N/A 08/27/2017   Procedure: TRANSESOPHAGEAL ECHOCARDIOGRAM (TEE);  Surgeon: Thurmon Fair, MD;  Location: [facility];  Service: Cardiovascular;  Laterality: N/A;   TONSILLECTOMY     Past Medical History:  Diagnosis Date   Anemia    severe   Anxiety    Asthma    CKD (chronic kidney disease)    stage IV   DDD (degenerative disc disease), cervical    Depression    Diabetes mellitus (HCC)    Dislocated shoulder    right   GERD (gastroesophageal reflux disease)    Headache    migraines (about once a month)   History of kidney stones    Hypertension    Neuropathy    Peripheral vascular disease (HCC)    Pneumonia    Stroke (HCC)    Ht 5\' 2"  (1.575 m)   Wt 139 lb (63 kg)   LMP 05/22/2018 (Within Days)   BMI 25.42 kg/m   Opioid Risk Score:   Fall Risk Score:  `1  Depression screen [facility] 2/9     05/08/2023    9:26 AM 10/10/2022   10:28 AM 04/27/2022    3:13 PM 03/02/2022   10:32 AM 01/17/2022   11:17 AM 07/05/2020    1:27 PM 09/13/2018   11:41 AM  Depression screen PHQ 2/9  Decreased Interest 0 0 1  1 3 1   Down, Depressed, Hopeless 0 0 1  2 3 1   PHQ - 2 Score 0 0 2  3 6 2   Altered sleeping 0   1 1 1    Tired, decreased energy 1   2 2 1    Change in appetite 1    2 1    Feeling bad or failure about yourself  1    3 2    Trouble concentrating 1    2 0   Moving slowly or fidgety/restless 0    1 0   Suicidal thoughts 0    0 0   PHQ-9 Score 4    14 11    Difficult doing work/chores     Somewhat difficult       Review of Systems  Respiratory:  Positive for apnea.   Gastrointestinal:  Positive for constipation.  Musculoskeletal:  Positive for gait problem.  Neurological:  Positive for tremors and weakness.  Psychiatric/Behavioral:  The patient is nervous/anxious.   All other systems reviewed and are  negative.     Objective:   Physical Exam        Assessment & Plan:

## 2023-05-09 ENCOUNTER — Encounter: Payer: Self-pay | Admitting: Registered Nurse

## 2023-05-09 ENCOUNTER — Telehealth: Payer: Self-pay | Admitting: Cardiovascular Disease

## 2023-05-09 ENCOUNTER — Telehealth: Payer: Self-pay

## 2023-05-09 ENCOUNTER — Encounter: Payer: 59 | Admitting: Occupational Therapy

## 2023-05-09 NOTE — Telephone Encounter (Signed)
Dr Duke Salvia last saw patient in October, was need seen by cardiology during recent hospitalization, needs to obtain from PCP or Neurologist both of which she has seen since d/c.   Left message to call back

## 2023-05-09 NOTE — Telephone Encounter (Signed)
Marcelino Duster calling to get verbal order for occupational therapy for 1x a week for 5 weeks.

## 2023-05-09 NOTE — Telephone Encounter (Signed)
Verbal okay given for OT once a week for 5 weeks. To work on in the home independence, safety & ADL's.  Call back phone [phone].

## 2023-05-09 NOTE — Telephone Encounter (Signed)
Christina Orozco, verbalized understanding

## 2023-05-14 ENCOUNTER — Encounter: Payer: 59 | Admitting: Occupational Therapy

## 2023-05-17 ENCOUNTER — Encounter: Payer: 59 | Admitting: Occupational Therapy

## 2023-05-21 ENCOUNTER — Encounter: Payer: 59 | Admitting: Occupational Therapy

## 2023-05-24 ENCOUNTER — Encounter: Payer: 59 | Admitting: Occupational Therapy

## 2023-05-29 ENCOUNTER — Other Ambulatory Visit (HOSPITAL_COMMUNITY): Payer: Self-pay

## 2023-06-03 IMAGING — CT CT HEAD W/O CM
4 series · 17 of 47 positions shown, 19 images · non-contrast
Comparison: MRI 10/02/2020

CLINICAL DATA: Neuro deficit, acute, stroke suspected

EXAM:
CT HEAD WITHOUT CONTRAST
TECHNIQUE: Contiguous axial images were obtained from the base of the skull
through the vertex without intravenous contrast.

[Series 3: head wo · axial · 0.42mm/px · z∈[-92,+18]mm · 7 of 30 slices shown, 9 images]
[im 4/30  brain]
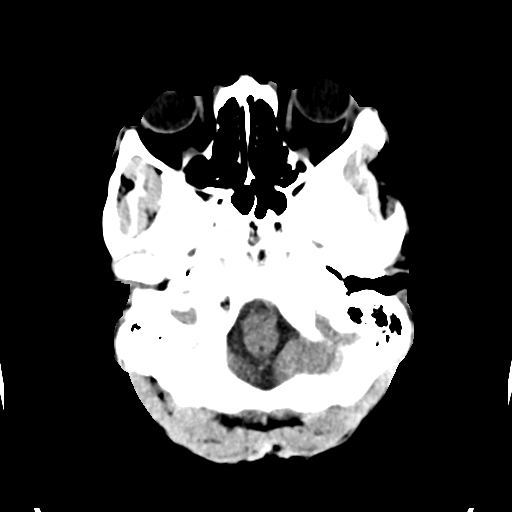
[im 4/30  bone]
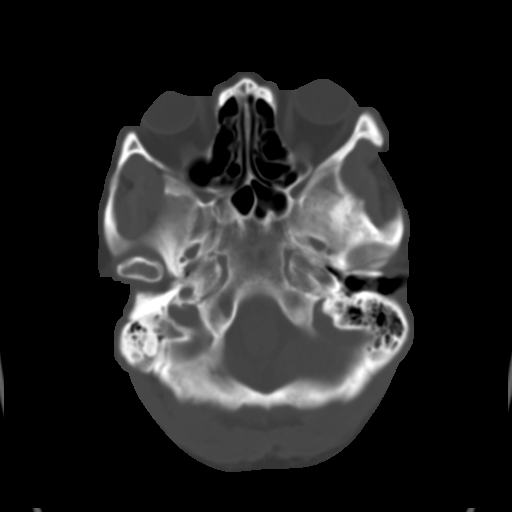
[im 8/30  brain]
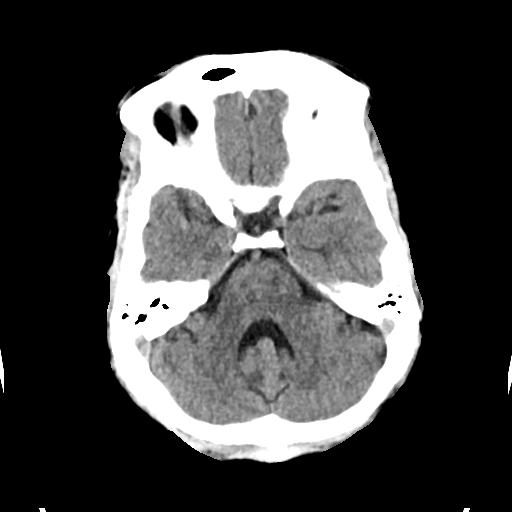
[im 11/30  brain]
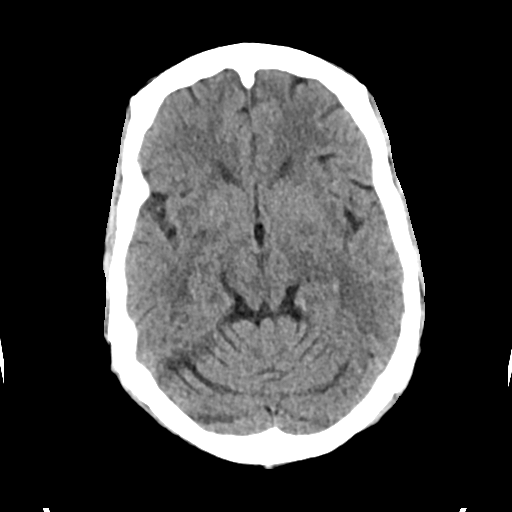
[im 15/30  brain]
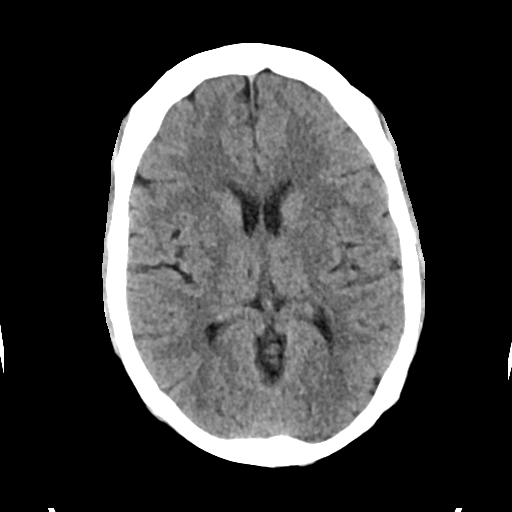
[im 19/30  brain]
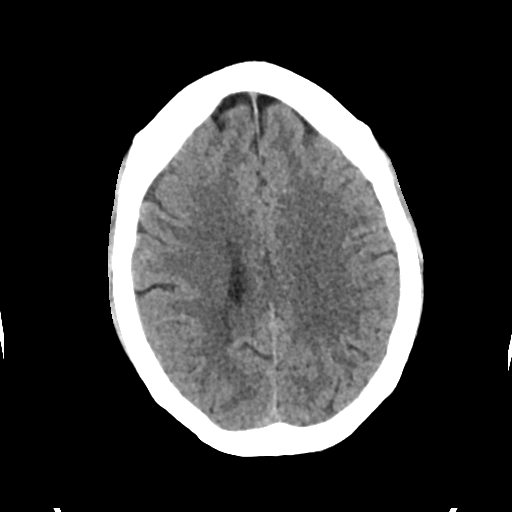
[im 19/30  bone]
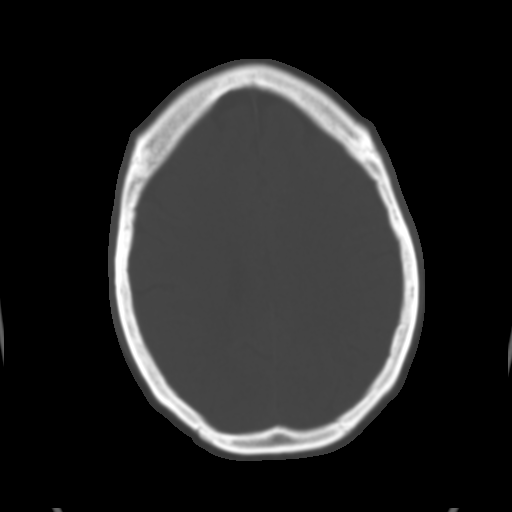
[im 22/30  brain]
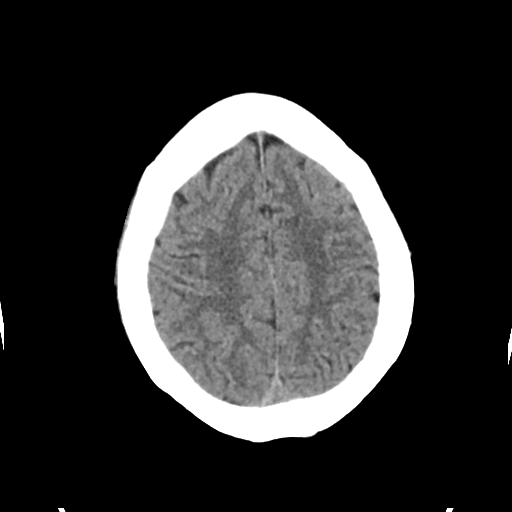
[im 26/30  brain]
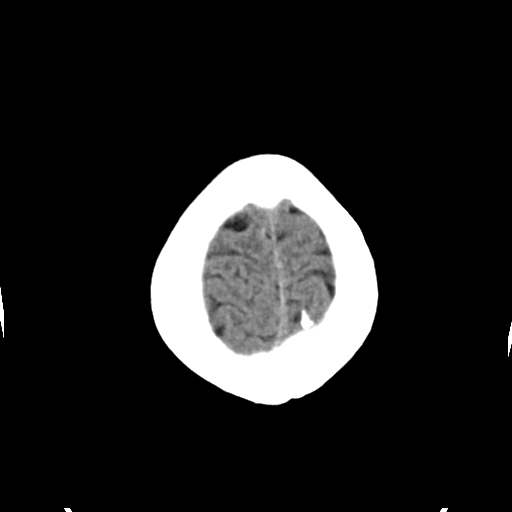

[Series 4: head bone · axial · 0.42mm/px · z∈[-94,-42]mm · 4 of 75 slices shown]
[im 8/75  bone]
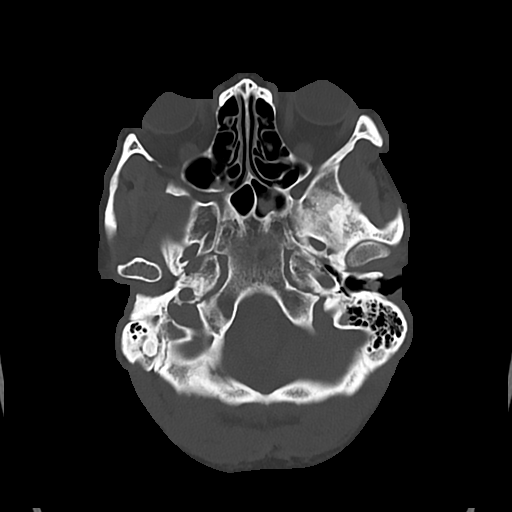
[im 15/75  bone]
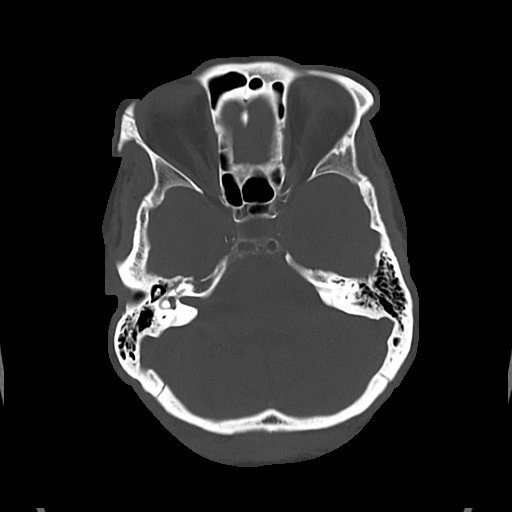
[im 23/75  bone]
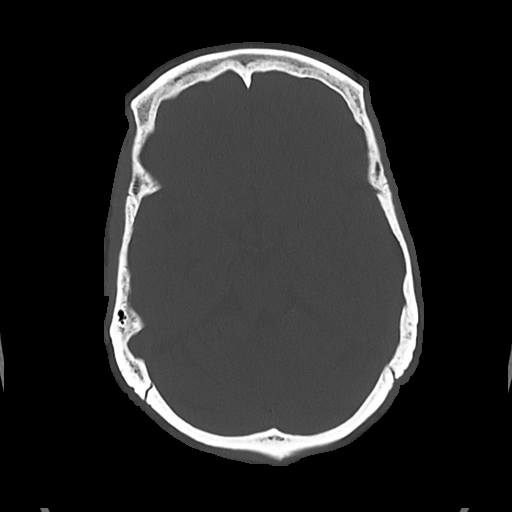
[im 34/75  bone]
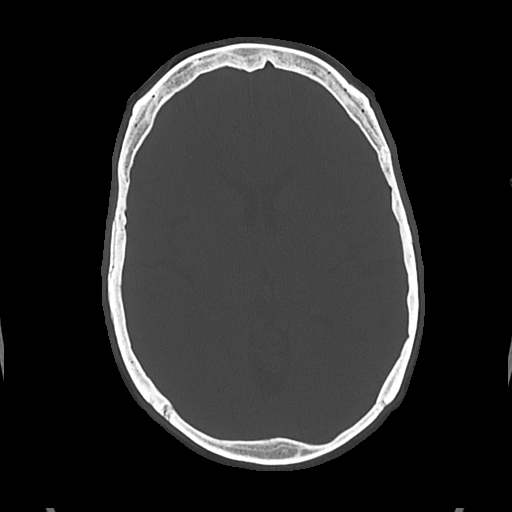

[Series 5: cor soft · coronal · 0.34mm/px · 3 of 68 slices shown]
[im 23/68  brain]
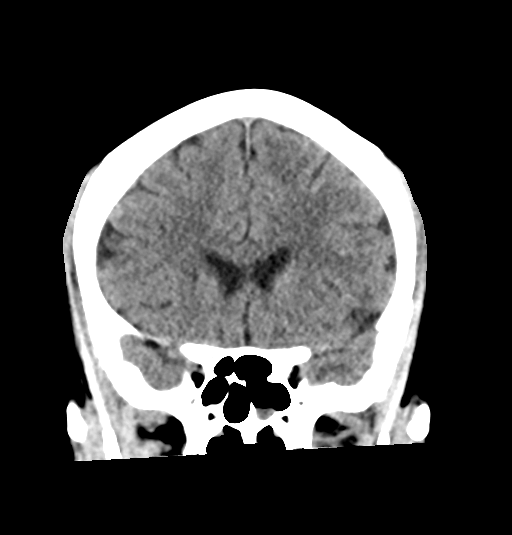
[im 30/68  brain]
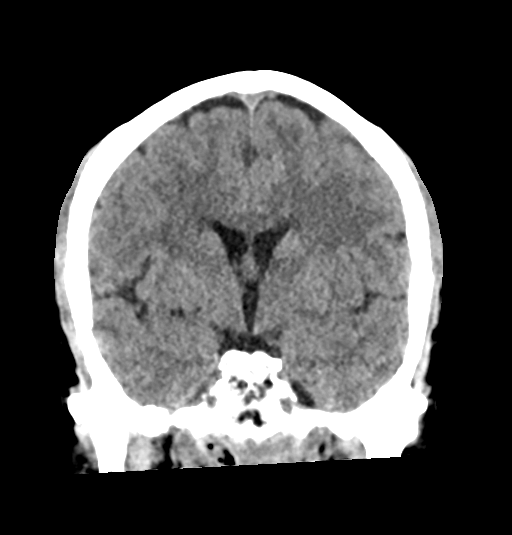
[im 38/68  brain]
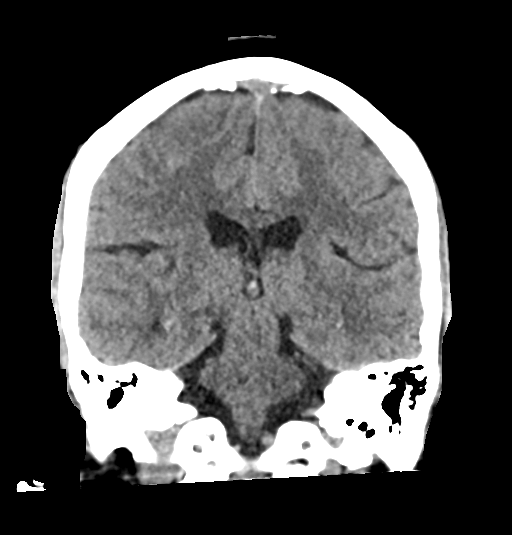

[Series 6: sag soft · sagittal · 0.35mm/px · 3 of 58 slices shown]
[im 20/58  brain]
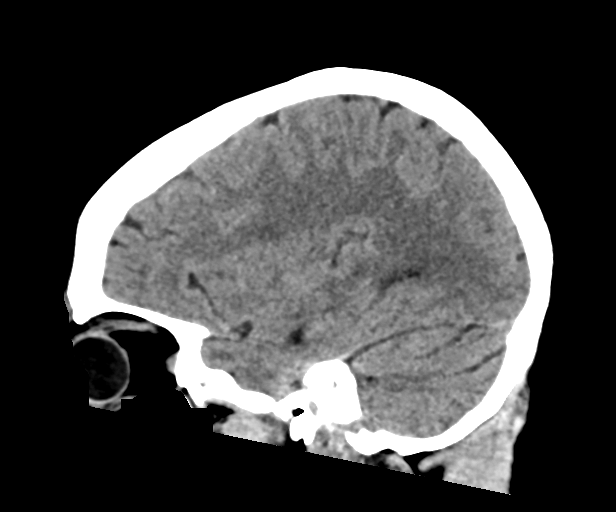
[im 29/58  brain]
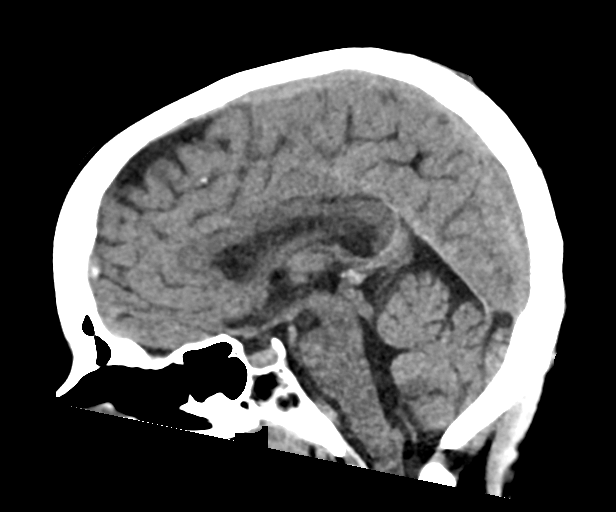
[im 39/58  brain]
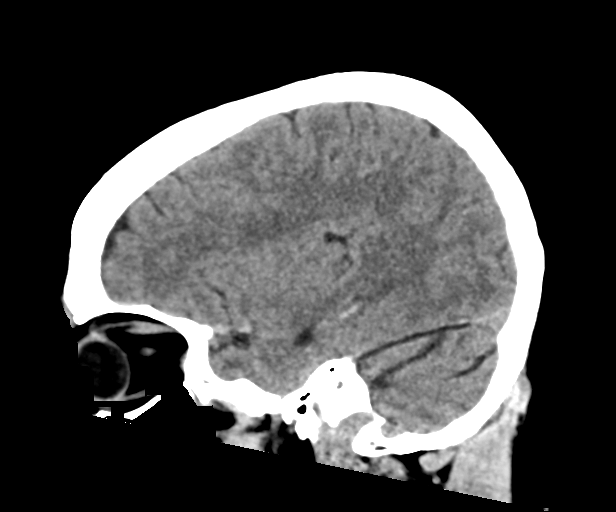

[17 of 47 positions shown; findings below may reference images not displayed]

FINDINGS: Brain: There is no evidence of acute intracranial hemorrhage.
Unchanged left thalamic lacunar infarct.The ventricles are normal in
size.Scattered subcortical and periventricular white matter
hypodensities, nonspecific but likely sequela of chronic small
vessel ischemic disease.

Vascular: No hyperdense vessel.  Carotid calcifications.

Skull: Normal. Negative for fracture or focal lesion.

Sinuses/Orbits: Scattered paranasal sinus mucosal thickening.

Other: None.
IMPRESSION: No acute intracranial abnormality.

Unchanged sequela of chronic small vessel ischemic disease.

## 2023-06-08 ENCOUNTER — Encounter: Payer: Self-pay | Admitting: Physical Medicine & Rehabilitation

## 2023-06-08 ENCOUNTER — Encounter: Payer: 59 | Attending: Psychology | Admitting: Physical Medicine & Rehabilitation

## 2023-06-08 VITALS — BP 147/77 | HR 75 | Ht 62.0 in | Wt 144.0 lb

## 2023-06-08 DIAGNOSIS — R531 Weakness: Secondary | ICD-10-CM | POA: Diagnosis not present

## 2023-06-08 NOTE — Progress Notes (Signed)
Subjective:    Patient ID: Christina Orozco, female    DOB: [birth year], 52 y.o.   MRN: [id number] 52 y.o. RH-female with history HTN, T2DM with retinopathy, neuropathy and nephropathy, MDD, OSA, multiple strokes last 08/30/2022 with right-sided weakness and dysarthria with ongoing outpatient therapy.  She was admitted on 04/13/2023 after syncopal episode with intractable nausea vomiting and double vision left eye.  MRI brain done revealing acute 7 mm cortical infarct in right frontoparietal region and right splenium with severe chronic small vessel disease as well as multiple chronic hemorrhages suggestive of chronic poorly controlled HTN.  Case was discussed with neurology who recommended continuing aspirin, Brilinta and atorvastatin as she was on maximum therapy.  Her GI symptoms had resolved but she continued to be limited by decrease in motor control of RUE as well as distorted vision on the left.  Therapy was working with patient required contact-guard to min assist overall.  CIR was recommended due to functional decline.    Admit date: 04/17/2023 Discharge date: 04/28/2023 HPI 52 year old female with history of multiple cortical and subcortical infarcts.  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  Sees Dr Armanda Magic , neuro  Recent MRI demonstrating additional small vessel subcortical infarcts  Also left nasopharyngeal (assymmetric) fullness noted with recs for direct inspection , Pt plans to f/u with PCP on this   No new swallowing issues  No new weakness or weakness   No recent falls   Staying by herself and is now approved for PCS services  Still getting HHPT, OT just finished   Sees her retina MD 06/12/2023 General optho next month Sees Nephro for diabetic nephropathy  Pain Inventory Average Pain 10 Pain Right Now 5 My pain is aching  LOCATION OF PAIN  shoulders,  neck  BOWEL Number of stools per week: 1-2 Oral laxative use No  Type of laxative . Enema or suppository use No  History of colostomy No  Incontinent No   BLADDER Pads In and out cath, frequency . Able to self cath  . Bladder incontinence Yes  Frequent urination No  Leakage with coughing No  Difficulty starting stream No  Incomplete bladder emptying No    Mobility walk with assistance use a walker how many minutes can you walk? 5 ability to climb steps?  no do you drive?  no  Function disabled: date disabled . I need assistance with the following:  bathing, meal prep, household duties, and shopping  Neuro/Psych bladder control problems weakness tremor trouble walking dizziness depression anxiety  Prior Studies CT/MRI  Physicians involved in your care .   Family History  Problem Relation Age of Onset   Vascular Disease Mother    CAD Mother    Heart failure Mother    Heart disease Other    Cancer Other        colon, stomach, pancreatic, lung   Diabetes Other    Breast cancer Sister 30   Seizures Maternal Uncle    Social History   Socioeconomic History   Marital status: Single    Spouse name: Not on file   Number of children: 3   Years of education: 12+   Highest education level: Some college, no degree  Occupational History   Not on file  Tobacco Use   Smoking status: Never   Smokeless tobacco: Never  Vaping Use   Vaping status: Never Used  Substance and Sexual Activity   Alcohol use: No   Drug use: No   Sexual activity: Not Currently  Other Topics Concern   Not on file  Social History Narrative   Lives with daughter   Caffeine use: "once in a blue moon"   Right handed   Social Drivers of Health   Financial Resource Strain: Medium Risk (09/06/2022)   Received [facility], [facility]   Overall Financial Resource Strain (CARDIA)    Difficulty of Paying Living Expenses: Somewhat hard  Food Insecurity: Low Risk   (02/27/2023)   Received from [facility]   Hunger Vital Sign    Worried About Running Out of Food in the Last Year: Never true    Ran Out of Food in the Last Year: Never true  Transportation Needs: No Transportation Needs (02/27/2023)   Received from [facility]    In the past 12 months, has lack of reliable transportation kept you from medical appointments, meetings, work or from getting things needed for daily living? : No  Physical Activity: Not on file  Stress: Not on file  Social Connections: Unknown (10/10/2021)   [facility], [facility]   Social Network    Social Network: Not on file   Past Surgical History:  Procedure Laterality Date   ADENOIDECTOMY     APPENDECTOMY     CERVICAL SPINE SURGERY  06/2012   C5-C6   EYE SURGERY     left eye surgery    FOOT SURGERY     KENALOG INJECTION Left 03/10/2021   Procedure: Intravitreal Avastin Injection;  Surgeon: Stephannie Li, MD;  Location: [facility] OR;  Service: Ophthalmology;  Laterality: Left;   LOOP RECORDER INSERTION N/A 08/27/2017   Procedure: LOOP RECORDER INSERTION;  Surgeon: Thurmon Fair, MD;  [facility]: [facility];  Service: Cardiovascular;  Laterality: N/A;   MEMBRANE PEEL Left 03/10/2021   Procedure: MEMBRANE PEEL;  Surgeon: Stephannie Li, MD;  Location: [facility] OR;  Service: Ophthalmology;  Laterality: Left;   PARS PLANA VITRECTOMY Left 03/10/2021   Procedure: TWENTY-FIVE GAUGE PARS PLANA VITRECTOMY;  Surgeon: Stephannie Li, MD;  Location: [facility] OR;  Service: Ophthalmology;  Laterality: Left;   PHOTOCOAGULATION WITH LASER Left 03/10/2021   Procedure: PHOTOCOAGULATION WITH LASER;  Surgeon: Stephannie Li, MD;  Location: [facility] OR;  Service: Ophthalmology;  Laterality: Left;   SHOULDER SURGERY     TEE WITHOUT CARDIOVERSION N/A 08/27/2017   Procedure: TRANSESOPHAGEAL ECHOCARDIOGRAM (TEE);  Surgeon: Thurmon Fair, MD;  Location: [facility];  Service: Cardiovascular;  Laterality: N/A;    TONSILLECTOMY     Past Medical History:  Diagnosis Date   Anemia    severe   Anxiety    Asthma    CKD (chronic kidney disease)    stage IV   DDD (degenerative disc disease), cervical    Depression    Diabetes mellitus (HCC)    Dislocated shoulder    right   GERD (gastroesophageal reflux disease)    Headache    migraines (about once a month)   History of kidney stones    Hypertension    Neuropathy    Peripheral vascular disease (HCC)    Pneumonia    Stroke (HCC)    Ht 5\' 2"  (1.575 m)   Wt 144 lb (65.3 kg)   LMP 05/22/2018 (Within Days)   BMI 26.34 kg/m   Opioid Risk Score:   Fall Risk Score:  `1  Depression screen [facility] 2/9  05/08/2023    9:26 AM 10/10/2022   10:28 AM 04/27/2022    3:13 PM 03/02/2022   10:32 AM 01/17/2022   11:17 AM 07/05/2020    1:27 PM 09/13/2018   11:41 AM  Depression screen PHQ 2/9  Decreased Interest 0 0 1  1 3 1   Down, Depressed, Hopeless 0 0 1  2 3 1   PHQ - 2 Score 0 0 2  3 6 2   Altered sleeping 0   1 1 1    Tired, decreased energy 1   2 2 1    Change in appetite 1    2 1    Feeling bad or failure about yourself  1    3 2    Trouble concentrating 1    2 0   Moving slowly or fidgety/restless 0    1 0   Suicidal thoughts 0    0 0   PHQ-9 Score 4    14 11    Difficult doing work/chores Somewhat difficult    Somewhat difficult       Review of Systems  Musculoskeletal:  Positive for neck pain.       Shoulder pain  Neurological:  Positive for weakness.  Psychiatric/Behavioral:  Positive for dysphoric mood. The patient is nervous/anxious.   All other systems reviewed and are negative.     Objective:   Physical Exam General No acute distress Mood and affect appropriate Extremities without edema Motor strength is 4+ in the left deltoid, bicep, tricep, grip, hip flexor, knee extensor, ankle dorsiflexor 5/5 on the right side same muscle groups Sensation equal bilateral upper extremities to light touch Finger-nose-finger exam mild dysmetria  on left side Neuro: Horizontal nystagmus on the left side with lateral gaze Tone is normal without evidence of spasticity Cerebellar exam shows no evidence of ataxia on finger nose finger or heel to shin testing Mild to moderate trunkal ataxia  Motor strength is 5/5 in bilateral deltoid, biceps, triceps, finger flexors and extensors, wrist flexors and extensors, hip flexors, knee flexors and extensors, ankle dorsiflexors, plantar flexors, invertors and evertors, toe flexors and extensors  Sensory exam is normal to light touch in the upper Cranial nerves  III- no evidence of ptosis, upward, downward and medial gaze intact IV- no vertical diplopia or head tilt V- no facial numbness or masseter weakness VI- no pupil abduction weakness VII- no facial droop, good lid closure VII- normal auditory acuity IX- no pharygeal weakness, X- no hoarseness XI- no trap or SCM weakness XII- no glossal weakness   Ambulates with rolling walker      Assessment & Plan:   1.  History of multiple recurrent cortical and subcortical infarcts.  Patient has subacute infarcts on a recent MRI of the brain dated 05/28/2023 ordered by her neurologist.  She will follow-up with him on the results. Incidental finding of fullness left nasopharynx she is aware of this and will follow-up with her PCP on this. She continues to receive home health PT and recently finished OT. I will see her back in about 6 months. She will follow-up with nephrology, PCP, neurology, retina specialist, general ophthalmology

## 2023-06-14 ENCOUNTER — Ambulatory Visit: Payer: 59 | Admitting: Gastroenterology

## 2023-06-14 NOTE — Progress Notes (Deleted)
 HPI : Christina Orozco is a 53 y.o. 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 Admited 04/13/23 with an acute ischemic CVA who presents to clinic for follow-up of recent hospitalization.  During that hospitalization in November, her hemoglobin was noted to drift down from her baseline of 9.3, down to 7.  She did not have any overt GI bleeding, and had 3 fecal occult blood stool tests on November 9, 10th and 11th.  The test on the 10th was positive, but the other 2 were negative.  Her hemoglobin improved from 7 on November 10 to 9 on November 26.  It does not appear that she received a blood transfusion.   Past Medical History:  Diagnosis Date   Anemia    severe   Anxiety    Asthma    CKD (chronic kidney disease)    stage IV   DDD (degenerative disc disease), cervical    Depression    Diabetes mellitus (HCC)    Dislocated shoulder    right   GERD (gastroesophageal reflux disease)    Headache    migraines (about once a month)   History of kidney stones    Hypertension    Neuropathy    Peripheral vascular disease (HCC)    Pneumonia    Stroke [facility])      Past Surgical History:  Procedure Laterality Date   ADENOIDECTOMY     APPENDECTOMY     CERVICAL SPINE SURGERY  06/2012   C5-C6   EYE SURGERY     left eye surgery    FOOT SURGERY     KENALOG  INJECTION Left 03/10/2021   Procedure: Intravitreal Avastin  Injection;  Surgeon: Jarold Mayo, MD;  Location: [facility] OR;  Service: Ophthalmology;  Laterality: Left;   LOOP RECORDER INSERTION N/A 08/27/2017   Procedure: LOOP RECORDER INSERTION;  Surgeon: Francyne Headland, MD;  Location: [facility] INVASIVE CV LAB;  Service: Cardiovascular;  Laterality: N/A;   MEMBRANE PEEL Left 03/10/2021   Procedure: MEMBRANE PEEL;  Surgeon: Jarold Mayo, MD;  Location: [facility] OR;  Service: Ophthalmology;  Laterality: Left;   PARS PLANA VITRECTOMY Left 03/10/2021   Procedure: TWENTY-FIVE GAUGE PARS  PLANA VITRECTOMY;  Surgeon: Jarold Mayo, MD;  Location: [facility] OR;  Service: Ophthalmology;  Laterality: Left;   PHOTOCOAGULATION WITH LASER Left 03/10/2021   Procedure: PHOTOCOAGULATION WITH LASER;  Surgeon: Jarold Mayo, MD;  Location: [facility] OR;  Service: Ophthalmology;  Laterality: Left;   SHOULDER SURGERY     TEE WITHOUT CARDIOVERSION N/A 08/27/2017   Procedure: TRANSESOPHAGEAL ECHOCARDIOGRAM (TEE);  Surgeon: Francyne Headland, MD;  Location: [facility] ENDOSCOPY;  Service: Cardiovascular;  Laterality: N/A;   TONSILLECTOMY     Family History  Problem Relation Age of Onset   Vascular Disease Mother    CAD Mother    Heart failure Mother    Heart disease Other    Cancer Other        colon, stomach, pancreatic, lung   Diabetes Other    Breast cancer Sister 30   Seizures Maternal Uncle    Social History   Tobacco Use   Smoking status: Never   Smokeless tobacco: Never  Vaping Use   Vaping status: Never Used  Substance Use Topics   Alcohol  use: No   Drug use: No   Current Outpatient Medications  Medication Sig Dispense Refill   albuterol  (PROAIR  HFA) 108 (90 Base) MCG/ACT inhaler INHALE 1 OR 2 PUFFS INTO THE LUNGS EVERY 6  HOURS AS NEEDED FOR WHEEZING OR SHORTNESS OF BREATH 8.5 g 0   amLODipine  (NORVASC ) 10 MG tablet TAKE 1 TABLET (10 MG TOTAL) BY MOUTH DAILY. (AM) 90 tablet 3   aspirin  EC 81 MG tablet Take 1 tablet (81 mg total) by mouth 2 (two) times daily. Swallow whole. 100 tablet 0   atorvastatin  (LIPITOR ) 80 MG tablet Take 1 tablet (80 mg total) by mouth at bedtime. 90 tablet 1   carvedilol  (COREG ) 12.5 MG tablet Take 1 tablet (12.5 mg total) by mouth 2 (two) times daily with a meal. 180 tablet 3   FLUoxetine  (PROZAC ) 20 MG capsule Take 20 mg by mouth daily.     fluticasone  (FLONASE ) 50 MCG/ACT nasal spray Place 2 sprays into both nostrils daily as needed for allergies or rhinitis.     melatonin 5 MG TABS Take 1 tablet (5 mg total) by mouth at bedtime as needed. 30 tablet 0   montelukast   (SINGULAIR ) 10 MG tablet Take 10 mg by mouth at bedtime.     naphazoline-glycerin  (CLEAR EYES REDNESS) 0.012-0.25 % SOLN Place 1-2 drops into the left eye 2 (two) times daily.     OZEMPIC , 0.25 OR 0.5 MG/DOSE, 2 MG/3ML SOPN Inject 0.5 mg into the skin See admin instructions. Inject 0.5 mg subcutaneous every 10 days     pantoprazole  (PROTONIX ) 40 MG tablet Take 1 tablet (40 mg total) by mouth 2 (two) times daily. 60 tablet 0   sodium bicarbonate  650 MG tablet Take 1 tablet (650 mg total) by mouth 3 (three) times daily. 90 tablet 0   ticagrelor  (BRILINTA ) 90 MG TABS tablet Take 1 tablet (90 mg total) by mouth 2 (two) times daily. 60 tablet 0   No current facility-administered medications for this visit.   Allergies  Allergen Reactions   Lisinopril  Swelling and Other (See Comments)    Facial swelling  angioedema   Penicillins Hives    Has patient had a PCN reaction causing immediate rash, facial/tongue/throat swelling, SOB or lightheadedness with hypotension: yes Has patient had a PCN reaction causing severe rash involving mucus membranes or skin necrosis: No Has patient had a PCN reaction that required hospitalization No Has patient had a PCN reaction occurring within the last 10 years: No If all of the above answers are NO, then may proceed wit Rash on buttock that makes sitting very difficult   Quetiapine Other (See Comments)    Unknown    Gabapentin  Swelling and Other (See Comments)    I thought I was having another stroke. dizziness     Review of Systems: All systems reviewed and negative except where noted in HPI.    No results found.  Physical Exam: LMP 05/22/2018 (Within Days)  Constitutional: Pleasant,well-developed, ***female in no acute distress. HEENT: Normocephalic and atraumatic. Conjunctivae are normal. No scleral icterus. Neck supple.  Cardiovascular: Normal rate, regular rhythm.  Pulmonary/chest: Effort normal and breath sounds normal. No wheezing, rales or  rhonchi. Abdominal: Soft, nondistended, nontender. Bowel sounds active throughout. There are no masses palpable. No hepatomegaly. Extremities: no edema Lymphadenopathy: No cervical adenopathy noted. Neurological: Alert and oriented to person place and time. Skin: Skin is warm and dry. No rashes noted. Psychiatric: Normal mood and affect. Behavior is normal.  CBC    Component Value Date/Time   WBC 6.4 04/24/2023 0502   RBC 2.46 (L) 04/24/2023 0502   HGB 7.1 (L) 04/24/2023 0502   HGB 9.8 (L) 08/20/2017 1503   HCT 21.9 (L) 04/24/2023 0502  HCT 29.2 (L) 08/20/2017 1503   PLT 166 04/24/2023 0502   PLT 308 08/20/2017 1503   MCV 89.0 04/24/2023 0502   MCV 84 08/20/2017 1503   MCH 28.9 04/24/2023 0502   MCHC 32.4 04/24/2023 0502   RDW 15.3 04/24/2023 0502   RDW 14.6 08/20/2017 1503   LYMPHSABS 1.8 04/18/2023 0531   LYMPHSABS 2.7 08/20/2017 1503   MONOABS 0.5 04/18/2023 0531   EOSABS 0.1 04/18/2023 0531   EOSABS 0.2 08/20/2017 1503   BASOSABS 0.0 04/18/2023 0531   BASOSABS 0.0 08/20/2017 1503    CMP     Component Value Date/Time   NA 142 04/28/2023 0503   NA 145 (H) 06/17/2020 1053   K 4.0 04/28/2023 0503   CL 113 (H) 04/28/2023 0503   CO2 22 04/28/2023 0503   GLUCOSE 101 (H) 04/28/2023 0503   BUN 53 (H) 04/28/2023 0503   BUN 28 (H) 06/17/2020 1053   CREATININE 3.40 (H) 04/28/2023 0503   CREATININE 1.73 (H) 04/23/2017 0943   CALCIUM  9.0 04/28/2023 0503   PROT 6.3 (L) 04/18/2023 0531   PROT 6.7 06/17/2020 1053   ALBUMIN 3.1 (L) 04/28/2023 0503   ALBUMIN 4.2 06/17/2020 1053   AST 24 04/18/2023 0531   ALT 31 04/18/2023 0531   ALKPHOS 76 04/18/2023 0531   BILITOT 0.4 04/18/2023 0531   BILITOT <0.2 06/17/2020 1053   GFRNONAA 16 (L) 04/28/2023 0503   GFRNONAA 36 (L) 04/23/2017 0943   GFRAA 27 (L) 06/17/2020 1053   GFRAA 41 (L) 04/23/2017 0943       Latest Ref Rng & Units 04/24/2023    5:02 AM 04/23/2023    4:56 AM 04/18/2023    5:31 AM  CBC EXTENDED  WBC 4.0 -  10.5 K/uL 6.4  5.7  6.0   RBC 3.87 - 5.11 MIL/uL 2.46  2.55  2.69   Hemoglobin 12.0 - 15.0 g/dL 7.1  7.2  7.7   HCT 63.9 - 46.0 % 21.9  22.6  23.9   Platelets 150 - 400 K/uL 166  176  190   NEUT# 1.7 - 7.7 K/uL   3.6   Lymph# 0.7 - 4.0 K/uL   1.8       ASSESSMENT AND PLAN:  Evangelina Tinnie Brier*

## 2023-07-09 ENCOUNTER — Encounter (HOSPITAL_COMMUNITY): Payer: Self-pay

## 2023-07-13 ENCOUNTER — Encounter (HOSPITAL_COMMUNITY): Payer: Self-pay

## 2023-07-14 ENCOUNTER — Encounter (HOSPITAL_COMMUNITY): Payer: Self-pay

## 2023-07-16 ENCOUNTER — Encounter: Payer: Self-pay | Admitting: Gastroenterology

## 2023-07-16 ENCOUNTER — Ambulatory Visit (INDEPENDENT_AMBULATORY_CARE_PROVIDER_SITE_OTHER): Payer: 59 | Admitting: Gastroenterology

## 2023-07-16 ENCOUNTER — Other Ambulatory Visit (INDEPENDENT_AMBULATORY_CARE_PROVIDER_SITE_OTHER): Payer: 59

## 2023-07-16 VITALS — BP 122/70 | HR 86 | Ht 62.0 in | Wt 133.0 lb

## 2023-07-16 DIAGNOSIS — N184 Chronic kidney disease, stage 4 (severe): Secondary | ICD-10-CM

## 2023-07-16 DIAGNOSIS — K5909 Other constipation: Secondary | ICD-10-CM

## 2023-07-16 DIAGNOSIS — K219 Gastro-esophageal reflux disease without esophagitis: Secondary | ICD-10-CM | POA: Diagnosis not present

## 2023-07-16 DIAGNOSIS — R195 Other fecal abnormalities: Secondary | ICD-10-CM | POA: Diagnosis not present

## 2023-07-16 DIAGNOSIS — J3489 Other specified disorders of nose and nasal sinuses: Secondary | ICD-10-CM

## 2023-07-16 DIAGNOSIS — R9389 Abnormal findings on diagnostic imaging of other specified body structures: Secondary | ICD-10-CM

## 2023-07-16 DIAGNOSIS — K5900 Constipation, unspecified: Secondary | ICD-10-CM

## 2023-07-16 DIAGNOSIS — D649 Anemia, unspecified: Secondary | ICD-10-CM | POA: Diagnosis not present

## 2023-07-16 DIAGNOSIS — I6930 Unspecified sequelae of cerebral infarction: Secondary | ICD-10-CM

## 2023-07-16 LAB — CBC
HCT: 28.5 % — ABNORMAL LOW (ref 36.0–46.0)
Hemoglobin: 9.5 g/dL — ABNORMAL LOW (ref 12.0–15.0)
MCHC: 33.2 g/dL (ref 30.0–36.0)
MCV: 90.4 fL (ref 78.0–100.0)
Platelets: 219 10*3/uL (ref 150.0–400.0)
RBC: 3.15 Mil/uL — ABNORMAL LOW (ref 3.87–5.11)
RDW: 15.1 % (ref 11.5–15.5)
WBC: 4 10*3/uL (ref 4.0–10.5)

## 2023-07-16 NOTE — Patient Instructions (Addendum)
Your provider has requested that you go to the basement level for lab work before leaving today. Press "B" on the elevator. The lab is located at the first door on the left as you exit the elevator.  You have been scheduled for a CT virtual colonoscopy at [facility] ( [address] [place] location). Your appointment is scheduled for 08/09/23 at 8 am. Please go to [facility] at least 72 hours prior to your exam to get your prep and instructions. If you need to reschedule your appointment or have specific questions regarding this test, please contact [facility] at [phone].   If your blood pressure at your visit was 140/90 or greater, please contact your primary care physician to follow up on this.  Start taking Metamucil daily  You can take Senna 1-2 tablets as needed   If you are age 64 or older, your body mass index should be between 23-30. Your Body mass index is 24.33 kg/m. If this is out of the aforementioned range listed, please consider follow up with your Primary Care Provider.  If you are age 26 or younger, your body mass index should be between 19-25. Your Body mass index is 24.33 kg/m. If this is out of the aformentioned range listed, please consider follow up with your Primary Care Provider.    The [facility] providers would like to encourage you to use [facility] to communicate with providers for non-urgent requests or questions.  Due to long hold times on the telephone, sending your provider a message by [facility] may be a faster and more efficient way to get a response.  Please allow 48 business hours for a response.  Please remember that this is for non-urgent requests.  _______________________________________________________  Thank you for entrusting me with your care and choosing [facility].  Dr Tomasa Rand

## 2023-07-16 NOTE — Progress Notes (Unsigned)
HPI :    GM: pancreatic cancer     Hgb 9.1 May 08, 2023 Scr 3.08 Iron 58, Sat 25%, TIBC 235 (L), Ferritin (415 (H)  Component Ref Range & Units (hover) 2 mo ago (04/23/23) 2 mo ago (04/22/23) 2 mo ago (04/21/23) 5 yr ago (04/11/18)  Fecal Occult Bld NEGATIVE POSITIVE Abnormal  CM NEGATIVE CM NEGATIVE CM    Past Medical History:  Diagnosis Date   Anemia    severe   Anxiety    Asthma    CKD (chronic kidney disease)    stage IV   DDD (degenerative disc disease), cervical    Depression    Diabetes mellitus (HCC)    Dislocated shoulder    right   GERD (gastroesophageal reflux disease)    Headache    migraines (about once a month)   History of kidney stones    Hypertension    Neuropathy    Peripheral vascular disease (HCC)    Pneumonia    Stroke [facility])      Past Surgical History:  Procedure Laterality Date   ADENOIDECTOMY     APPENDECTOMY     CERVICAL SPINE SURGERY  06/2012   C5-C6   EYE SURGERY     left eye surgery    FOOT SURGERY     KENALOG INJECTION Left 03/10/2021   Procedure: Intravitreal Avastin Injection;  Surgeon: Stephannie Li, MD;  Location: [facility] OR;  Service: Ophthalmology;  Laterality: Left;   LOOP RECORDER INSERTION N/A 08/27/2017   Procedure: LOOP RECORDER INSERTION;  Surgeon: Thurmon Fair, MD;  Location: [facility] INVASIVE CV LAB;  Service: Cardiovascular;  Laterality: N/A;   MEMBRANE PEEL Left 03/10/2021   Procedure: MEMBRANE PEEL;  Surgeon: Stephannie Li, MD;  Location: [facility] OR;  Service: Ophthalmology;  Laterality: Left;   PARS PLANA VITRECTOMY Left 03/10/2021   Procedure: TWENTY-FIVE GAUGE PARS PLANA VITRECTOMY;  Surgeon: Stephannie Li, MD;  Location: [facility] OR;  Service: Ophthalmology;  Laterality: Left;   PHOTOCOAGULATION WITH LASER Left 03/10/2021   Procedure: PHOTOCOAGULATION WITH LASER;  Surgeon: Stephannie Li, MD;  Location: [facility] OR;  Service: Ophthalmology;  Laterality: Left;   SHOULDER SURGERY     TEE WITHOUT CARDIOVERSION N/A 08/27/2017    Procedure: TRANSESOPHAGEAL ECHOCARDIOGRAM (TEE);  Surgeon: Thurmon Fair, MD;  Location: [facility] ENDOSCOPY;  Service: Cardiovascular;  Laterality: N/A;   TONSILLECTOMY     Family History  Problem Relation Age of Onset   Vascular Disease Mother    CAD Mother    Heart failure Mother    Heart disease Other    Cancer Other        colon, stomach, pancreatic, lung   Diabetes Other    Breast cancer Sister 30   Seizures Maternal Uncle    Social History   Tobacco Use   Smoking status: Never   Smokeless tobacco: Never  Vaping Use   Vaping status: Never Used  Substance Use Topics   Alcohol use: No   Drug use: No   Current Outpatient Medications  Medication Sig Dispense Refill   albuterol (PROAIR HFA) 108 (90 Base) MCG/ACT inhaler INHALE 1 OR 2 PUFFS INTO THE LUNGS EVERY 6 HOURS AS NEEDED FOR WHEEZING OR SHORTNESS OF BREATH 8.5 g 0   amLODipine (NORVASC) 10 MG tablet TAKE 1 TABLET (10 MG TOTAL) BY MOUTH DAILY. (AM) 90 tablet 3   aspirin EC 81 MG tablet Take 1 tablet (81 mg total) by mouth 2 (two) times daily. Swallow whole. 100 tablet 0  atorvastatin (LIPITOR) 80 MG tablet Take 1 tablet (80 mg total) by mouth at bedtime. 90 tablet 1   carvedilol (COREG) 12.5 MG tablet Take 1 tablet (12.5 mg total) by mouth 2 (two) times daily with a meal. 180 tablet 3   FLUoxetine (PROZAC) 20 MG capsule Take 20 mg by mouth daily.     fluticasone (FLONASE) 50 MCG/ACT nasal spray Place 2 sprays into both nostrils daily as needed for allergies or rhinitis.     melatonin 5 MG TABS Take 1 tablet (5 mg total) by mouth at bedtime as needed. 30 tablet 0   montelukast (SINGULAIR) 10 MG tablet Take 10 mg by mouth at bedtime.     naphazoline-glycerin (CLEAR EYES REDNESS) 0.012-0.25 % SOLN Place 1-2 drops into the left eye 2 (two) times daily.     OZEMPIC, 0.25 OR 0.5 MG/DOSE, 2 MG/3ML SOPN Inject 0.5 mg into the skin See admin instructions. Inject 0.5 mg subcutaneous every 10 days     pantoprazole (PROTONIX) 40 MG  tablet Take 1 tablet (40 mg total) by mouth 2 (two) times daily. 60 tablet 0   sodium bicarbonate 650 MG tablet Take 1 tablet (650 mg total) by mouth 3 (three) times daily. 90 tablet 0   ticagrelor (BRILINTA) 90 MG TABS tablet Take 1 tablet (90 mg total) by mouth 2 (two) times daily. 60 tablet 0   No current facility-administered medications for this visit.   Allergies  Allergen Reactions   Lisinopril Swelling and Other (See Comments)    Facial swelling  angioedema   Penicillins Hives    Has patient had a PCN reaction causing immediate rash, facial/tongue/throat swelling, SOB or lightheadedness with hypotension: yes Has patient had a PCN reaction causing severe rash involving mucus membranes or skin necrosis: No Has patient had a PCN reaction that required hospitalization No Has patient had a PCN reaction occurring within the last 10 years: No If all of the above answers are "NO", then may proceed wit Rash on buttock that makes sitting very difficult   Quetiapine Other (See Comments)    Unknown    Gabapentin Swelling and Other (See Comments)    "I thought I was having another stroke." dizziness     Review of Systems: All systems reviewed and negative except where noted in HPI.    No results found.  Physical Exam: Ht 5\' 2"  (1.575 m)   Wt 133 lb (60.3 kg)   LMP 05/22/2018 (Within Days)   BMI 24.33 kg/m  Constitutional: Pleasant,well-developed, ***female in no acute distress. HEENT: Normocephalic and atraumatic. Conjunctivae are normal. No scleral icterus. Neck supple.  Cardiovascular: Normal rate, regular rhythm.  Pulmonary/chest: Effort normal and breath sounds normal. No wheezing, rales or rhonchi. Abdominal: Soft, nondistended, nontender. Bowel sounds active throughout. There are no masses palpable. No hepatomegaly. Extremities: no edema Lymphadenopathy: No cervical adenopathy noted. Neurological: Alert and oriented to person place and time. Skin: Skin is warm and dry.  No rashes noted. Psychiatric: Normal mood and affect. Behavior is normal.  CBC    Component Value Date/Time   WBC 6.4 04/24/2023 0502   RBC 2.46 (L) 04/24/2023 0502   HGB 7.1 (L) 04/24/2023 0502   HGB 9.8 (L) 08/20/2017 1503   HCT 21.9 (L) 04/24/2023 0502   HCT 29.2 (L) 08/20/2017 1503   PLT 166 04/24/2023 0502   PLT 308 08/20/2017 1503   MCV 89.0 04/24/2023 0502   MCV 84 08/20/2017 1503   MCH 28.9 04/24/2023 0502   MCHC 32.4  04/24/2023 0502   RDW 15.3 04/24/2023 0502   RDW 14.6 08/20/2017 1503   LYMPHSABS 1.8 04/18/2023 0531   LYMPHSABS 2.7 08/20/2017 1503   MONOABS 0.5 04/18/2023 0531   EOSABS 0.1 04/18/2023 0531   EOSABS 0.2 08/20/2017 1503   BASOSABS 0.0 04/18/2023 0531   BASOSABS 0.0 08/20/2017 1503    CMP     Component Value Date/Time   NA 142 04/28/2023 0503   NA 145 (H) 06/17/2020 1053   K 4.0 04/28/2023 0503   CL 113 (H) 04/28/2023 0503   CO2 22 04/28/2023 0503   GLUCOSE 101 (H) 04/28/2023 0503   BUN 53 (H) 04/28/2023 0503   BUN 28 (H) 06/17/2020 1053   CREATININE 3.40 (H) 04/28/2023 0503   CREATININE 1.73 (H) 04/23/2017 0943   CALCIUM 9.0 04/28/2023 0503   PROT 6.3 (L) 04/18/2023 0531   PROT 6.7 06/17/2020 1053   ALBUMIN 3.1 (L) 04/28/2023 0503   ALBUMIN 4.2 06/17/2020 1053   AST 24 04/18/2023 0531   ALT 31 04/18/2023 0531   ALKPHOS 76 04/18/2023 0531   BILITOT 0.4 04/18/2023 0531   BILITOT <0.2 06/17/2020 1053   GFRNONAA 16 (L) 04/28/2023 0503   GFRNONAA 36 (L) 04/23/2017 0943   GFRAA 27 (L) 06/17/2020 1053   GFRAA 41 (L) 04/23/2017 0943       Latest Ref Rng & Units 04/24/2023    5:02 AM 04/23/2023    4:56 AM 04/18/2023    5:31 AM  CBC EXTENDED  WBC 4.0 - 10.5 K/uL 6.4  5.7  6.0   RBC 3.87 - 5.11 MIL/uL 2.46  2.55  2.69   Hemoglobin 12.0 - 15.0 g/dL 7.1  7.2  7.7   HCT 04.5 - 46.0 % 21.9  22.6  23.9   Platelets 150 - 400 K/uL 166  176  190   NEUT# 1.7 - 7.7 K/uL   3.6   Lymph# 0.7 - 4.0 K/uL   1.8       ASSESSMENT AND  PLAN:  Christina Orozco*

## 2023-07-17 ENCOUNTER — Encounter: Payer: Self-pay | Admitting: Gastroenterology

## 2023-07-17 NOTE — Progress Notes (Signed)
Christina Orozco,  Your hemoglobin stable from the last check in late November.  Plan to proceed with virtual colonoscopy as discussed.

## 2023-08-01 ENCOUNTER — Encounter (HOSPITAL_COMMUNITY): Payer: Self-pay

## 2023-08-09 ENCOUNTER — Other Ambulatory Visit: Payer: 59

## 2023-08-28 ENCOUNTER — Other Ambulatory Visit: Payer: Self-pay

## 2023-08-28 DIAGNOSIS — I119 Hypertensive heart disease without heart failure: Secondary | ICD-10-CM

## 2023-08-28 MED ORDER — CARVEDILOL 12.5 MG PO TABS: 12.5000 mg | ORAL_TABLET | Freq: Two times a day (BID) | ORAL | 2 refills | Status: DC

## 2023-08-30 ENCOUNTER — Encounter: Payer: 59 | Admitting: Psychology

## 2023-09-07 ENCOUNTER — Ambulatory Visit
Admission: RE | Admit: 2023-09-07 | Discharge: 2023-09-07 | Disposition: A | Discharge status: Home / Self Care | Payer: 59 | Source: Ambulatory Visit | Attending: Gastroenterology | Admitting: Gastroenterology

## 2023-09-07 ENCOUNTER — Encounter (HOSPITAL_COMMUNITY): Payer: Self-pay

## 2023-09-07 DIAGNOSIS — D649 Anemia, unspecified: Secondary | ICD-10-CM

## 2023-09-07 DIAGNOSIS — I6930 Unspecified sequelae of cerebral infarction: Secondary | ICD-10-CM

## 2023-09-07 DIAGNOSIS — R195 Other fecal abnormalities: Secondary | ICD-10-CM

## 2023-09-10 ENCOUNTER — Encounter: Payer: Self-pay | Admitting: Gastroenterology

## 2023-09-10 NOTE — Progress Notes (Signed)
 Ms. Caven,  Your virtual colonoscopy did not reveal any colon masses or obvious polyps.  I recommend you repeat colon cancer screening in 5 years.  At that time we can consider repeat virtual colonoscopy or optical colonoscopy.  Maya,  Please place reminder for repeat colon cancer screening for 5 years.  Will need an office visit.

## 2023-09-11 ENCOUNTER — Encounter (HOSPITAL_BASED_OUTPATIENT_CLINIC_OR_DEPARTMENT_OTHER): Payer: Self-pay

## 2023-09-11 ENCOUNTER — Ambulatory Visit (HOSPITAL_BASED_OUTPATIENT_CLINIC_OR_DEPARTMENT_OTHER): Payer: 59 | Admitting: Family

## 2023-09-11 NOTE — Telephone Encounter (Signed)
 Rescheduled

## 2023-09-20 ENCOUNTER — Ambulatory Visit (HOSPITAL_BASED_OUTPATIENT_CLINIC_OR_DEPARTMENT_OTHER): Admitting: Family

## 2023-09-21 ENCOUNTER — Inpatient Hospital Stay (HOSPITAL_COMMUNITY)
Admission: EM | Admit: 2023-09-21 | Discharge: 2023-09-29 | DRG: 065 | Disposition: A | Discharge status: Home Health | Attending: Internal Medicine | Admitting: Internal Medicine

## 2023-09-21 ENCOUNTER — Other Ambulatory Visit: Payer: Self-pay

## 2023-09-21 ENCOUNTER — Emergency Department (HOSPITAL_COMMUNITY)

## 2023-09-21 ENCOUNTER — Encounter (HOSPITAL_COMMUNITY): Payer: Self-pay

## 2023-09-21 DIAGNOSIS — R112 Nausea with vomiting, unspecified: Secondary | ICD-10-CM | POA: Diagnosis present

## 2023-09-21 DIAGNOSIS — N184 Chronic kidney disease, stage 4 (severe): Secondary | ICD-10-CM | POA: Diagnosis present

## 2023-09-21 DIAGNOSIS — I6329 Cerebral infarction due to unspecified occlusion or stenosis of other precerebral arteries: Secondary | ICD-10-CM | POA: Diagnosis not present

## 2023-09-21 DIAGNOSIS — Z7982 Long term (current) use of aspirin: Secondary | ICD-10-CM

## 2023-09-21 DIAGNOSIS — D638 Anemia in other chronic diseases classified elsewhere: Secondary | ICD-10-CM | POA: Diagnosis present

## 2023-09-21 DIAGNOSIS — E1122 Type 2 diabetes mellitus with diabetic chronic kidney disease: Secondary | ICD-10-CM | POA: Diagnosis present

## 2023-09-21 DIAGNOSIS — N179 Acute kidney failure, unspecified: Secondary | ICD-10-CM | POA: Diagnosis present

## 2023-09-21 DIAGNOSIS — E1159 Type 2 diabetes mellitus with other circulatory complications: Secondary | ICD-10-CM | POA: Diagnosis present

## 2023-09-21 DIAGNOSIS — M62838 Other muscle spasm: Secondary | ICD-10-CM | POA: Diagnosis not present

## 2023-09-21 DIAGNOSIS — M2578 Osteophyte, vertebrae: Secondary | ICD-10-CM | POA: Diagnosis present

## 2023-09-21 DIAGNOSIS — K5900 Constipation, unspecified: Secondary | ICD-10-CM | POA: Diagnosis present

## 2023-09-21 DIAGNOSIS — D72819 Decreased white blood cell count, unspecified: Secondary | ICD-10-CM | POA: Diagnosis present

## 2023-09-21 DIAGNOSIS — Z79899 Other long term (current) drug therapy: Secondary | ICD-10-CM

## 2023-09-21 DIAGNOSIS — F419 Anxiety disorder, unspecified: Secondary | ICD-10-CM | POA: Diagnosis present

## 2023-09-21 DIAGNOSIS — E785 Hyperlipidemia, unspecified: Secondary | ICD-10-CM | POA: Diagnosis present

## 2023-09-21 DIAGNOSIS — Y844 Aspiration of fluid as the cause of abnormal reaction of the patient, or of later complication, without mention of misadventure at the time of the procedure: Secondary | ICD-10-CM | POA: Diagnosis not present

## 2023-09-21 DIAGNOSIS — Z803 Family history of malignant neoplasm of breast: Secondary | ICD-10-CM

## 2023-09-21 DIAGNOSIS — Z79890 Hormone replacement therapy (postmenopausal): Secondary | ICD-10-CM

## 2023-09-21 DIAGNOSIS — E1151 Type 2 diabetes mellitus with diabetic peripheral angiopathy without gangrene: Secondary | ICD-10-CM | POA: Diagnosis present

## 2023-09-21 DIAGNOSIS — Z87442 Personal history of urinary calculi: Secondary | ICD-10-CM

## 2023-09-21 DIAGNOSIS — E1169 Type 2 diabetes mellitus with other specified complication: Secondary | ICD-10-CM | POA: Diagnosis present

## 2023-09-21 DIAGNOSIS — I152 Hypertension secondary to endocrine disorders: Secondary | ICD-10-CM | POA: Diagnosis present

## 2023-09-21 DIAGNOSIS — Z7901 Long term (current) use of anticoagulants: Secondary | ICD-10-CM

## 2023-09-21 DIAGNOSIS — Z888 Allergy status to other drugs, medicaments and biological substances status: Secondary | ICD-10-CM

## 2023-09-21 DIAGNOSIS — R300 Dysuria: Secondary | ICD-10-CM | POA: Diagnosis present

## 2023-09-21 DIAGNOSIS — Z7985 Long-term (current) use of injectable non-insulin antidiabetic drugs: Secondary | ICD-10-CM

## 2023-09-21 DIAGNOSIS — Z5986 Financial insecurity: Secondary | ICD-10-CM

## 2023-09-21 DIAGNOSIS — G971 Other reaction to spinal and lumbar puncture: Secondary | ICD-10-CM | POA: Diagnosis not present

## 2023-09-21 DIAGNOSIS — Z7902 Long term (current) use of antithrombotics/antiplatelets: Secondary | ICD-10-CM

## 2023-09-21 DIAGNOSIS — Z8249 Family history of ischemic heart disease and other diseases of the circulatory system: Secondary | ICD-10-CM

## 2023-09-21 DIAGNOSIS — R270 Ataxia, unspecified: Secondary | ICD-10-CM | POA: Diagnosis present

## 2023-09-21 DIAGNOSIS — R9431 Abnormal electrocardiogram [ECG] [EKG]: Secondary | ICD-10-CM | POA: Diagnosis present

## 2023-09-21 DIAGNOSIS — D631 Anemia in chronic kidney disease: Secondary | ICD-10-CM | POA: Diagnosis present

## 2023-09-21 DIAGNOSIS — Z880 Allergy status to penicillin: Secondary | ICD-10-CM

## 2023-09-21 DIAGNOSIS — Z833 Family history of diabetes mellitus: Secondary | ICD-10-CM

## 2023-09-21 DIAGNOSIS — E11319 Type 2 diabetes mellitus with unspecified diabetic retinopathy without macular edema: Secondary | ICD-10-CM | POA: Diagnosis present

## 2023-09-21 DIAGNOSIS — R1012 Left upper quadrant pain: Secondary | ICD-10-CM | POA: Diagnosis not present

## 2023-09-21 DIAGNOSIS — I69398 Other sequelae of cerebral infarction: Secondary | ICD-10-CM

## 2023-09-21 DIAGNOSIS — I639 Cerebral infarction, unspecified: Secondary | ICD-10-CM | POA: Diagnosis not present

## 2023-09-21 DIAGNOSIS — E1140 Type 2 diabetes mellitus with diabetic neuropathy, unspecified: Secondary | ICD-10-CM | POA: Diagnosis present

## 2023-09-21 DIAGNOSIS — R29702 NIHSS score 2: Secondary | ICD-10-CM | POA: Diagnosis present

## 2023-09-21 DIAGNOSIS — I69351 Hemiplegia and hemiparesis following cerebral infarction affecting right dominant side: Secondary | ICD-10-CM

## 2023-09-21 DIAGNOSIS — F329 Major depressive disorder, single episode, unspecified: Secondary | ICD-10-CM | POA: Diagnosis present

## 2023-09-21 DIAGNOSIS — M79605 Pain in left leg: Secondary | ICD-10-CM | POA: Diagnosis not present

## 2023-09-21 DIAGNOSIS — K219 Gastro-esophageal reflux disease without esophagitis: Secondary | ICD-10-CM | POA: Diagnosis present

## 2023-09-21 DIAGNOSIS — G4733 Obstructive sleep apnea (adult) (pediatric): Secondary | ICD-10-CM | POA: Diagnosis present

## 2023-09-21 DIAGNOSIS — M4802 Spinal stenosis, cervical region: Secondary | ICD-10-CM | POA: Diagnosis present

## 2023-09-21 DIAGNOSIS — H539 Unspecified visual disturbance: Secondary | ICD-10-CM | POA: Diagnosis present

## 2023-09-21 DIAGNOSIS — J4520 Mild intermittent asthma, uncomplicated: Secondary | ICD-10-CM | POA: Diagnosis present

## 2023-09-21 LAB — URINALYSIS, ROUTINE W REFLEX MICROSCOPIC
Bilirubin Urine: NEGATIVE
Glucose, UA: NEGATIVE mg/dL
Hgb urine dipstick: NEGATIVE
Ketones, ur: NEGATIVE mg/dL
Nitrite: NEGATIVE
Protein, ur: 30 mg/dL — AB
Specific Gravity, Urine: 1.01 (ref 1.005–1.030)
WBC, UA: >50 WBC/hpf (ref 0–5)
pH: 5 (ref 5.0–8.0)

## 2023-09-21 LAB — COMPREHENSIVE METABOLIC PANEL WITH GFR
ALT: 35 U/L (ref 0–44)
AST: 33 U/L (ref 15–41)
Albumin: 4 g/dL (ref 3.5–5.0)
Alkaline Phosphatase: 43 U/L (ref 38–126)
Anion gap: 12 (ref 5–15)
BUN: 36 mg/dL — ABNORMAL HIGH (ref 6–20)
CO2: 21 mmol/L — ABNORMAL LOW (ref 22–32)
Calcium: 9.3 mg/dL (ref 8.9–10.3)
Chloride: 109 mmol/L (ref 98–111)
Creatinine, Ser: 2.84 mg/dL — ABNORMAL HIGH (ref 0.44–1.00)
GFR, Estimated: 20 mL/min — ABNORMAL LOW (ref >60)
Glucose, Bld: 97 mg/dL (ref 70–99)
Potassium: 4.3 mmol/L (ref 3.5–5.1)
Sodium: 142 mmol/L (ref 135–145)
Total Bilirubin: 0.7 mg/dL (ref 0.0–1.2)
Total Protein: 7.3 g/dL (ref 6.5–8.1)

## 2023-09-21 LAB — CBC WITH DIFFERENTIAL/PLATELET
Abs Immature Granulocytes: 0 10*3/uL (ref 0.00–0.07)
Basophils Absolute: 0 10*3/uL (ref 0.0–0.1)
Basophils Relative: 1 %
Eosinophils Absolute: 0.1 10*3/uL (ref 0.0–0.5)
Eosinophils Relative: 2 %
HCT: 27.1 % — ABNORMAL LOW (ref 36.0–46.0)
Hemoglobin: 8.8 g/dL — ABNORMAL LOW (ref 12.0–15.0)
Immature Granulocytes: 0 %
Lymphocytes Relative: 43 %
Lymphs Abs: 1.7 10*3/uL (ref 0.7–4.0)
MCH: 29.5 pg (ref 26.0–34.0)
MCHC: 32.5 g/dL (ref 30.0–36.0)
MCV: 90.9 fL (ref 80.0–100.0)
Monocytes Absolute: 0.2 10*3/uL (ref 0.1–1.0)
Monocytes Relative: 6 %
Neutro Abs: 1.8 10*3/uL (ref 1.7–7.7)
Neutrophils Relative %: 48 %
Platelets: 202 10*3/uL (ref 150–400)
RBC: 2.98 MIL/uL — ABNORMAL LOW (ref 3.87–5.11)
RDW: 15.8 % — ABNORMAL HIGH (ref 11.5–15.5)
WBC: 3.8 10*3/uL — ABNORMAL LOW (ref 4.0–10.5)
nRBC: 0 % (ref 0.0–0.2)

## 2023-09-21 MED ORDER — SODIUM CHLORIDE 0.9 % IV SOLN
1.0000 g | Freq: Once | INTRAVENOUS | Status: AC
  Administered 2023-09-21: 1 g via INTRAVENOUS
  Filled 2023-09-21: qty 10

## 2023-09-21 NOTE — ED Notes (Signed)
 PT ambulated to the bathroom with the use of a walker and escorted by [employer]).

## 2023-09-21 NOTE — ED Provider Triage Note (Signed)
 Emergency Medicine Provider Triage Evaluation Note  Christina Orozco , a 53 y.o. female  was evaluated in triage.  Pt complains of right sided weakness.  Patient reports waking up at 8 a.m. with weakness of the right side, worse than her baseline weakness from prior strokes.  States that she has sharp sensation to the right side to touch compared to the left.  No vision changes, slurred speech, or confusion.  She takes Brilinta but is on blood thinners.  States that her symptoms feel different from when she has had a stroke in the past.  Also endorses pain to the left upper quadrant and right upper quadrant.  States she has not had bowel movement in the past week, which is abnormal for her.  Dysuria, vomiting, or nausea.   Review of Systems  Positive: Right-sided weakness, abdominal pain Negative: Vision changes, confusion  Physical Exam  BP (!) 155/98 (BP Location: Right Arm)   Pulse 95   Temp 98.6 F (37 C)   Resp 18   Ht 5' 2.5" (1.588 m)   Wt 59.4 kg   LMP 05/22/2018 (Within Days)   SpO2 100%   BMI 23.58 kg/m  Gen:   Awake, no distress   Resp:  Normal effort  MSK:   Moves extremities without difficulty  Other:    Medical Decision Making  Medically screening exam initiated at 2:24 PM.  Appropriate orders placed.  Laken Cecchi was informed that the remainder of the evaluation will be completed by another provider, this initial triage assessment does not replace that evaluation, and the importance of remaining in the ED until their evaluation is complete.    Maxwell Marion, PA-C 09/21/23 1621

## 2023-09-21 NOTE — H&P (Signed)
 History and Physical    Christina Orozco ZOX:[id number] DOB: 07-Feb-1971 DOA: 09/21/2023  PCP: Hampton Levins, PA-C  Patient coming from: Home  I have personally briefly reviewed patient's old medical records in [facility]  Chief Complaint: Right-sided weakness  HPI: Christina Orozco is a 53 y.o. 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  Patient reports developing worsening right-sided weakness involving her arm and leg around 8 AM on 4/11.  She says she has gait imbalance at baseline from prior strokes.  She says she did not fall.  No vision or speech changes.  Denies chest pain, palpitations, dyspnea, nausea, vomiting, dysuria.  She does report some abdominal discomfort and constipation with last bowel movement 1 week ago.  ED Course  Labs/Imaging on admission: I have personally reviewed following labs and imaging studies.  Initial vitals showed BP 155/98, pulse 95, RR 18, temp 98.6 F, SpO2 100% on room air.  Labs showed sodium 142, potassium 4.3, bicarb 21, BUN 36, creatinine 2.4, serum glucose 97, LFTs within normal limits, WBC 3.8, hemoglobin 8.8, platelets 202,000.  UA with negative nitrites, large leukocytes, 0-5 RBCs, >50 WBCs, few bacteria.  CT head without contrast negative for acute intracranial normality or significant interval change.  Multiple remote lacunar infarcts in the thalami bilaterally and stable extensive periventricular white matter disease bilaterally noted.  MRI brain without contrast showed 4 mm acute ischemic left pontine infarct.  No associated mass effect or hemorrhage.  Underlying advanced chronic microvascular schema disease with multiple remote lacunar infarct involving bilateral basal  ganglia, thalami, and pons.  Multiple chronic microhemorrhages most pronounced about the pons and thalami, most characteristic of poorly controlled hypertension.  MRI cervical spine IMPRESSION: 1. Degenerative disc osteophyte complex at C3-4 with resultant mild to moderate spinal stenosis. Degenerative reactive endplate change with marrow edema at this level could contribute to neck pain. 2. Prior ACDF at C5-6 without residual or recurrent stenosis. 3. Small central disc protrusion at C4-5 without significant stenosis.  Abdominal x-ray showed moderate to large volume of stool in the colon suggestive of constipation.  Patient was given IV ceftriaxone.  EDP consulted neurology who recommended medical admission and they will see in consultation.  Hospitalist service was consulted to admit.  Review of Systems: All systems reviewed and are negative except as documented in history of present illness above.   Past Medical History:  Diagnosis Date   Anemia    severe   Anxiety    Asthma    CKD (chronic kidney disease)    stage IV   DDD (degenerative disc disease), cervical    Depression    Diabetes mellitus (HCC)    Dislocated shoulder    right   GERD (gastroesophageal reflux disease)    Headache    migraines (about once a month)   History of kidney stones    Hypertension    Neuropathy    Peripheral vascular disease (HCC)    Pneumonia    Stroke [facility])     Past Surgical History:  Procedure Laterality Date   ADENOIDECTOMY     APPENDECTOMY     CERVICAL SPINE SURGERY  06/2012   C5-C6   EYE SURGERY     left eye surgery    FOOT SURGERY     KENALOG  INJECTION Left 03/10/2021   Procedure: Intravitreal Avastin Injection;  Surgeon: Linard Reno, MD;  Location: [facility] OR;  Service: Ophthalmology;  Laterality: Left;   LOOP RECORDER INSERTION N/A 08/27/2017   Procedure: LOOP RECORDER INSERTION;  Surgeon: Luana Rumple, MD;  Location: [facility] INVASIVE CV LAB;  Service: Cardiovascular;   Laterality: N/A;   MEMBRANE PEEL Left 03/10/2021   Procedure: MEMBRANE PEEL;  Surgeon: Linard Reno, MD;  Location: [facility] OR;  Service: Ophthalmology;  Laterality: Left;   PARS PLANA VITRECTOMY Left 03/10/2021   Procedure: TWENTY-FIVE GAUGE PARS PLANA VITRECTOMY;  Surgeon: Linard Reno, MD;  Location: [facility] OR;  Service: Ophthalmology;  Laterality: Left;   PHOTOCOAGULATION WITH LASER Left 03/10/2021   Procedure: PHOTOCOAGULATION WITH LASER;  Surgeon: Linard Reno, MD;  Location: [facility] OR;  Service: Ophthalmology;  Laterality: Left;   SHOULDER SURGERY     TEE WITHOUT CARDIOVERSION N/A 08/27/2017   Procedure: TRANSESOPHAGEAL ECHOCARDIOGRAM (TEE);  Surgeon: Luana Rumple, MD;  Location: [facility] ENDOSCOPY;  Service: Cardiovascular;  Laterality: N/A;   TONSILLECTOMY      Social History: Social History   Tobacco Use   Smoking status: Never   Smokeless tobacco: Never  Vaping Use   Vaping status: Never Used  Substance Use Topics   Alcohol use: No   Drug use: No    Allergies  Allergen Reactions   Lisinopril Swelling and Other (See Comments)    Facial swelling  angioedema   Penicillins Hives    Has patient had a PCN reaction causing immediate rash, facial/tongue/throat swelling, SOB or lightheadedness with hypotension: yes Has patient had a PCN reaction causing severe rash involving mucus membranes or skin necrosis: No Has patient had a PCN reaction that required hospitalization No Has patient had a PCN reaction occurring within the last 10 years: No If all of the above answers are "NO", then may proceed wit Rash on buttock that makes sitting very difficult   Quetiapine Other (See Comments)    Unknown    Gabapentin Swelling and Other (See Comments)    "I thought I was having another stroke." dizziness    Family History  Problem Relation Age of Onset   Vascular Disease Mother    CAD Mother    Heart failure Mother    Heart disease Other    Cancer Other        colon, stomach, pancreatic,  lung   Diabetes Other    Breast cancer Sister 30   Seizures Maternal Uncle      Prior to Admission medications   Medication Sig Start Date End Date Taking? Authorizing Provider  albuterol (PROAIR HFA) 108 (90 Base) MCG/ACT inhaler INHALE 1 OR 2 PUFFS INTO THE LUNGS EVERY 6 HOURS AS NEEDED FOR WHEEZING OR SHORTNESS OF BREATH 04/12/18  Yes Angiulli, Everlyn Hockey, PA-C  amLODipine (NORVASC) 10 MG tablet TAKE 1 TABLET (10 MG TOTAL) BY MOUTH DAILY. (AM) 03/05/23  Yes Clearnce Curia, NP  aspirin EC 81 MG tablet Take 1 tablet (81 mg total) by mouth 2 (two) times daily. Swallow whole. 04/27/23  Yes Love, Renay Carota, PA-C  atorvastatin (LIPITOR) 80 MG tablet Take 1 tablet (80 mg total) by mouth at bedtime. 10/05/22  Yes Walker, Caitlin S, NP  carvedilol (COREG) 12.5 MG tablet Take 1 tablet (12.5 mg total) by mouth 2 (two) times daily with a meal. 08/28/23  Yes Walker, Caitlin S, NP  FLUoxetine (PROZAC) 20 MG capsule Take 20 mg by mouth daily.   Yes [provider]  fluticasone Odis Bennetts)  50 MCG/ACT nasal spray Place 2 sprays into both nostrils daily as needed for allergies or rhinitis. 04/27/23  Yes Love, Renay Carota, PA-C  melatonin 5 MG TABS Take 1 tablet (5 mg total) by mouth at bedtime as needed. 04/27/23  Yes Love, Renay Carota, PA-C  montelukast (SINGULAIR) 10 MG tablet Take 10 mg by mouth at bedtime.   Yes [provider]  OZEMPIC, 0.25 OR 0.5 MG/DOSE, 2 MG/3ML SOPN Inject 0.5 mg into the skin See admin instructions. Inject 0.5 mg subcutaneous every 10 days 04/14/22  Yes [provider]  pantoprazole (PROTONIX) 40 MG tablet Take 1 tablet (40 mg total) by mouth 2 (two) times daily. 04/27/23  Yes Love, Renay Carota, PA-C  sodium bicarbonate 650 MG tablet Take 1 tablet (650 mg total) by mouth 3 (three) times daily. 04/27/23  Yes Love, Renay Carota, PA-C  ticagrelor (BRILINTA) 90 MG TABS tablet Take 1 tablet (90 mg total) by mouth 2 (two) times daily. 04/27/23  Yes Love, Renay Carota, PA-C   naphazoline-glycerin (CLEAR EYES REDNESS) 0.012-0.25 % SOLN Place 1-2 drops into the left eye 2 (two) times daily. Patient not taking: Reported on 09/21/2023 04/24/23   Jairo Mayer    Physical Exam: Vitals:   09/21/23 1730 09/21/23 1745 09/21/23 1748 09/21/23 2115  BP:  (!) 144/86  (!) 158/81  Pulse: 90 75  78  Resp: 17 19  17   Temp:   98.1 F (36.7 C)   TempSrc:   Oral   SpO2: 100% 100%  100%  Weight:      Height:       Constitutional: Resting in bed, NAD, calm, comfortable Eyes: EOMI, lids and conjunctivae normal ENMT: Mucous membranes are moist. Posterior pharynx clear of any exudate or lesions.Normal dentition.  Neck: normal, supple, no masses. Respiratory: clear to auscultation bilaterally, no wheezing, no crackles. Normal respiratory effort. No accessory muscle use.  Cardiovascular: Regular rate and rhythm, no murmurs / rubs / gallops. No extremity edema. 2+ pedal pulses. Abdomen: no tenderness, no masses palpated. Musculoskeletal: no clubbing / cyanosis. No joint deformity upper and lower extremities. Good ROM, no contractures. Normal muscle tone.  Skin: no rashes, lesions, ulcers. No induration Neurologic: Sensation intact. Strength 5/5 LUE and LLE, 4/5 RUE and RLE Psychiatric: Normal judgment and insight. Alert and oriented x 3. Normal mood.   EKG: Personally reviewed. Sinus rhythm, rate 89, no acute ischemic changes.  Assessment/Plan Principal Problem:   Left pontine stroke (HCC) Active Problems:   CKD (chronic kidney disease), stage IV (HCC)   Type 2 diabetes mellitus with chronic kidney disease, without long-term current use of insulin (HCC)   OSA (obstructive sleep apnea)   Hypertension associated with diabetes (HCC)   Mild intermittent asthma   Hyperlipidemia associated with type 2 diabetes mellitus (HCC)   Anemia, chronic disease   Christina Orozco is a 53 y.o. female with medical history significant for recurrent CVAs with residual right-sided  weakness and left visual field deficits, CKD stage IV, T2DM, HTN, HLD, asthma, anemia of chronic disease, depression/anxiety, and OSA on CPAP who is admitted with acute left pontine stroke.  Assessment and Plan: Acute ischemic left pontine stroke History of recurrent CVAs with residual right-sided weakness: Patient presenting with worsening right-sided weakness.  Found to have acute ischemic left pontine stroke on MRI brain. - MRA head - Carotid Dopplers - Echocardiogram - Keep on telemetry, continue neurochecks - Allowing permissive hypertension - Continue DAPT regimen with aspirin 81 mg BID and Brilinta  90 mg BID - Continue atorvastatin - PT/OT/SLP eval - Check A1c and lipid panel  CKD stage IV: Renal function stable on admission.  Continue monitor.  Anemia of chronic disease: Hemoglobin 8.8 on admission.  No obvious bleeding.  Continue monitor.  Type 2 diabetes: Holding home meds and placed on SSI.  Hypertension: Holding home meds while allowing permissive hypertension.  Mild intermittent asthma: Stable, continue Singulair and albuterol as needed.  Hyperlipidemia: Continue atorvastatin.  Depression/anxiety: Continue fluoxetine.  Constipation: Start bowel regimen.  OSA: Continue CPAP nightly.   DVT prophylaxis: heparin injection 5,000 Units Start: 09/22/23 0600 Code Status: Full code, confirmed with patient on admission Family Communication: Mother on speaker phone Disposition Plan: From home, dispo pending clinical progress Consults called: Neurology Severity of Illness: The appropriate patient status for this patient is OBSERVATION. Observation status is judged to be reasonable and necessary in order to provide the required intensity of service to ensure the patient's safety. The patient's presenting symptoms, physical exam findings, and initial radiographic and laboratory data in the context of their medical condition is felt to place them at decreased risk for  further clinical deterioration. Furthermore, it is anticipated that the patient will be medically stable for discharge from the hospital within 2 midnights of admission.   Edith Gores MD [facility]  If 7PM-7AM, please contact night-coverage [email or web address]  09/22/2023, 12:39 AM

## 2023-09-21 NOTE — ED Provider Notes (Signed)
 [place] [facility] Provider Note   CSN: [id number] Arrival date & time: 09/21/23  1330     History  Chief Complaint  Patient presents with   Extremity Weakness   Abdominal Pain   HPI  Christina Orozco is a 53 y.o. female with past medical history HTN, T2DM with retinopathy, neuropathy and nephropathy, MDD, OSA, multiple strokes presents due to acute onset right-sided weakness.  Patient states that when she woke up this morning at 8 AM, she noted weakness to right upper and right lower extremity.  States she went to sleep at around 10 PM last night with no neurologic symptoms.  Has a history of prior strokes with no residual deficits at this time, though she did have prior right sided weakness with previous stroke.  States has been taking all of her outpatient medications as prescribed, with daughter at bedside helping her.  She denies any trauma.  Does mention having neck pain for the last 2 days prior to onset of her symptoms.  Denies any cervical trauma or chiropractory.  Of note, she is also mentioning abdominal pain that is diffuse, more focused in the bilateral upper quadrants.  She has had no vomiting, but endorses constipation with last bowel movement about 1 week ago.   Extremity Weakness Associated symptoms include abdominal pain.  Abdominal Pain      Home Medications Prior to Admission medications   Medication Sig Start Date End Date Taking? Authorizing Provider  albuterol  (PROAIR  HFA) 108 (90 Base) MCG/ACT inhaler INHALE 1 OR 2 PUFFS INTO THE LUNGS EVERY 6 HOURS AS NEEDED FOR WHEEZING OR SHORTNESS OF BREATH 04/12/18  Yes Angiulli, Everlyn Hockey, PA-C  amLODipine  (NORVASC ) 10 MG tablet TAKE 1 TABLET (10 MG TOTAL) BY MOUTH DAILY. (AM) 03/05/23  Yes Clearnce Curia, NP  aspirin  EC 81 MG tablet Take 1 tablet (81 mg total) by mouth 2 (two) times daily. Swallow whole. 04/27/23  Yes Love, Renay Carota, PA-C  atorvastatin  (LIPITOR ) 80 MG tablet Take 1  tablet (80 mg total) by mouth at bedtime. 10/05/22  Yes Clearnce Curia, NP  carvedilol  (COREG ) 12.5 MG tablet Take 1 tablet (12.5 mg total) by mouth 2 (two) times daily with a meal. 08/28/23  Yes Clearnce Curia, NP  FLUoxetine  (PROZAC ) 20 MG capsule Take 20 mg by mouth daily.   Yes [provider]  fluticasone  (FLONASE ) 50 MCG/ACT nasal spray Place 2 sprays into both nostrils daily as needed for allergies or rhinitis. 04/27/23  Yes Love, Renay Carota, PA-C  melatonin 5 MG TABS Take 1 tablet (5 mg total) by mouth at bedtime as needed. 04/27/23  Yes Love, Renay Carota, PA-C  montelukast  (SINGULAIR ) 10 MG tablet Take 10 mg by mouth at bedtime.   Yes [provider]  OZEMPIC , 0.25 OR 0.5 MG/DOSE, 2 MG/3ML SOPN Inject 0.5 mg into the skin See admin instructions. Inject 0.5 mg subcutaneous every 10 days 04/14/22  Yes [provider]  pantoprazole  (PROTONIX ) 40 MG tablet Take 1 tablet (40 mg total) by mouth 2 (two) times daily. 04/27/23  Yes Love, Renay Carota, PA-C  sodium bicarbonate  650 MG tablet Take 1 tablet (650 mg total) by mouth 3 (three) times daily. 04/27/23  Yes Love, Renay Carota, PA-C  ticagrelor  (BRILINTA ) 90 MG TABS tablet Take 1 tablet (90 mg total) by mouth 2 (two) times daily. 04/27/23  Yes Love, Renay Carota, PA-C  naphazoline-glycerin  (CLEAR EYES REDNESS) 0.012-0.25 % SOLN Place 1-2 drops into the  left eye 2 (two) times daily. Patient not taking: Reported on 09/21/2023 04/24/23   Zelda Hickman, PA-C      Allergies    Lisinopril , Penicillins, Quetiapine, and Gabapentin     Review of Systems   Review of Systems  Gastrointestinal:  Positive for abdominal pain.  Musculoskeletal:  Positive for extremity weakness.    Physical Exam Updated Vital Signs BP (!) 158/81   Pulse 78   Temp 98.1 F (36.7 C) (Oral)   Resp 17   Ht 5' 2.5" (1.588 m)   Wt 59.4 kg   LMP 05/22/2018 (Within Days)   SpO2 100%   BMI 23.58 kg/m  Physical Exam Vitals and nursing note reviewed.   Constitutional:      General: She is not in acute distress.    Appearance: She is well-developed.  HENT:     Head: Normocephalic and atraumatic.  Eyes:     Conjunctiva/sclera: Conjunctivae normal.  Cardiovascular:     Rate and Rhythm: Normal rate and regular rhythm.     Heart sounds: No murmur heard. Pulmonary:     Effort: Pulmonary effort is normal. No respiratory distress.     Breath sounds: Normal breath sounds.  Abdominal:     General: Abdomen is flat.     Palpations: Abdomen is soft.     Comments: Diffuse tenderness to palpation with no rebound tenderness or guarding  Musculoskeletal:        General: No swelling.     Cervical back: Neck supple.  Skin:    General: Skin is warm and dry.     Capillary Refill: Capillary refill takes less than 2 seconds.  Neurological:     Mental Status: She is alert.     Comments: 3/5 strength in bilateral upper and lower extremities with intact sensation.  Cranial nerves II through XII intact, though at rest she does appear to have right-sided facial droop.  Normal finger-to-nose, no pronator drift  Psychiatric:        Mood and Affect: Mood normal.     ED Results / Procedures / Treatments   Labs (all labs ordered are listed, but only abnormal results are displayed) Labs Reviewed  COMPREHENSIVE METABOLIC PANEL WITH GFR - Abnormal; Notable for the following components:      Result Value   CO2 21 (*)    BUN 36 (*)    Creatinine, Ser 2.84 (*)    GFR, Estimated 20 (*)    All other components within normal limits  CBC WITH DIFFERENTIAL/PLATELET - Abnormal; Notable for the following components:   WBC 3.8 (*)    RBC 2.98 (*)    Hemoglobin 8.8 (*)    HCT 27.1 (*)    RDW 15.8 (*)    All other components within normal limits  URINALYSIS, ROUTINE W REFLEX MICROSCOPIC - Abnormal; Notable for the following components:   APPearance HAZY (*)    Protein, ur 30 (*)    Leukocytes,Ua LARGE (*)    Bacteria, UA FEW (*)    All other components  within normal limits    EKG EKG Interpretation Date/Time:  Friday September 21 2023 14:42:00 EDT Ventricular Rate:  89 PR Interval:  148 QRS Duration:  76 QT Interval:  390 QTC Calculation: 474 R Axis:   61  Text Interpretation: Normal sinus rhythm Cannot rule out Anterior infarct , age undetermined Abnormal ECG When compared with ECG of 13-Apr-2023 22:29, PREVIOUS ECG IS PRESENT No significant change since last tracing Confirmed by Youlanda Henry  H [phone]) on 09/21/2023 5:50:56 PM  Radiology MR Cervical Spine Wo Contrast Result Date: 09/21/2023 CLINICAL DATA:  Initial evaluation for acute neck pain. EXAM: MRI CERVICAL SPINE WITHOUT CONTRAST TECHNIQUE: Multiplanar, multisequence MR imaging of the cervical spine was performed. No intravenous contrast was administered. COMPARISON:  And MRI from 07/05/2015. FINDINGS: Alignment: Straightening with mild smooth reversal of the normal cervical lordosis. No significant listhesis. Vertebrae: Prior ACDF at C5-6 with solid arthrodesis. Vertebral body height maintained without acute or chronic fracture. Atypical hemangioma noted within the T1 vertebral body. No other worrisome osseous lesions. Degenerative reactive endplate change with marrow edema present about the C3-4 interspace. No other abnormal marrow edema. Cord: Normal signal and morphology. Posterior Fossa, vertebral arteries, paraspinal tissues: Changes of severe chronic microvascular ischemic disease noted within the pons. Craniocervical junction within normal limits. Paraspinous soft tissues within normal limits. Normal flow voids seen within the vertebral arteries bilaterally. Disc levels: C2-C3: Unremarkable. C3-C4: Degenerative intervertebral disc space narrowing with diffuse disc osteophyte complex. Broad posterior component flattens and effaces the ventral thecal sac. Mild cord flattening without cord signal changes. Mild to moderate spinal stenosis. Foramina remain patent. C4-C5: Small central disc  protrusion indents the ventral thecal sac. No significant spinal stenosis. Mild uncovertebral spurring without significant foraminal encroachment. C5-C6: Prior fusion. No residual spinal stenosis. Foramina appear patent. C6-C7: Mild disc bulge with uncovertebral spurring. No spinal stenosis. Foramina remain patent. C7-T1: Normal interspace. Minimal right-sided facet hypertrophy. No canal or foraminal stenosis. IMPRESSION: 1. Degenerative disc osteophyte complex at C3-4 with resultant mild to moderate spinal stenosis. Degenerative reactive endplate change with marrow edema at this level could contribute to neck pain. 2. Prior ACDF at C5-6 without residual or recurrent stenosis. 3. Small central disc protrusion at C4-5 without significant stenosis. Electronically Signed   By: Virgia Griffins M.D.   On: 09/21/2023 21:57   MR BRAIN WO CONTRAST Result Date: 09/21/2023 CLINICAL DATA:  Initial evaluation for acute neuro deficit, right-sided symptoms. EXAM: MRI HEAD WITHOUT CONTRAST TECHNIQUE: Multiplanar, multiecho pulse sequences of the brain and surrounding structures were obtained without intravenous contrast. COMPARISON:  Comparison made with prior CT from earlier the same day as well as previous MRI from 04/14/2023 FINDINGS: Brain: Generalized cerebral atrophy, most pronounced about the brainstem, advanced for age. Extensive T2/FLAIR hyperintensity involving the periventricular deep white matter both cerebral hemispheres, with patchy involvement of the deep gray nuclei, brainstem, and cerebellum, most likely related to advanced chronic microvascular ischemic disease. Multiple remote lacunar infarcts present about the bilateral basal ganglia, thalami, and pons. 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 No associated mass effect or convincing associated hemorrhage, although evaluation limited as there is multiple chronic micro hemorrhages at this location. No other evidence for acute or  subacute ischemia. Gray-white matter differentiation otherwise maintained. No acute intracranial hemorrhage. Multiple chronic micro hemorrhages noted, most pronounced about the pons and thalami, most characteristic of chronic poorly controlled hypertension. No mass lesion, midline shift or mass effect. No hydrocephalus or extra-axial fluid collection. Pituitary gland within normal limits. Vascular: Major intracranial vascular flow voids are maintained. Skull and upper cervical spine: Craniocervical junction within normal limits. Bone marrow signal intensity normal. No scalp soft tissue abnormality. Sinuses/Orbits: Prior bilateral ocular lens replacement. Paranasal sinuses are clear. No significant mastoid effusion. Other: None. IMPRESSION: 1. 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 No associated mass effect or hemorrhage. 2. Underlying advanced chronic microvascular ischemic disease, with multiple remote lacunar infarcts about the bilateral basal ganglia,  thalami, and pons. 3. Multiple chronic micro hemorrhages, most pronounced about the pons and thalami, most characteristic of chronic poorly controlled hypertension. Electronically Signed   By: Virgia Griffins M.D.   On: 09/21/2023 21:51   CT Head Wo Contrast Result Date: 09/21/2023 CLINICAL DATA:  Right-sided weakness worse than baseline since waking up at 8 a.m. today. EXAM: CT HEAD WITHOUT CONTRAST TECHNIQUE: Contiguous axial images were obtained from the base of the skull through the vertex without intravenous contrast. RADIATION DOSE REDUCTION: This exam was performed according to the departmental dose-optimization program which includes automated exposure control, adjustment of the mA and/or kV according to patient size and/or use of iterative reconstruction technique. COMPARISON:  CT head without contrast 04/14/2023. MRI head 05/28/2023. FINDINGS: Brain: Multiple remote lacunar infarcts are again noted in the thalami bilaterally. Extensive  periventricular white matter changes are present bilaterally. No acute infarct, hemorrhage, or mass lesion is present. Moderate confluent white matter hypoattenuation is stable. Basal ganglia are otherwise within normal limits. The insular ribbon is normal bilaterally. No acute or focal cortical abnormality is present. Extensive white matter changes are present the brainstem and right greater than left cerebellar peduncles, similar the prior exam. No acute infarct or hemorrhage is present within the posterior fossa. Midline structures are within normal limits. Vascular: Atherosclerotic calcifications are present within the cavernous internal carotid arteries bilaterally. No hyperdense vessel is present. Skull: Calvarium is intact. No focal lytic or blastic lesions are present. No significant extracranial soft tissue lesion is present. Sinuses/Orbits: The paranasal sinuses and mastoid air cells are clear. Bilateral lens replacements are noted. Globes and orbits are otherwise unremarkable. IMPRESSION: 1. No acute intracranial abnormality or significant interval change. 2. Multiple remote lacunar infarcts in the thalami bilaterally. 3. Stable extensive periventricular white matter disease bilaterally is stable. This likely reflects the sequela of chronic microvascular ischemia. Electronically Signed   By: Audree Leas M.D.   On: 09/21/2023 18:18   DG Abdomen 1 View Result Date: 09/21/2023 CLINICAL DATA:  Constipation EXAM: ABDOMEN - 1 VIEW COMPARISON:  None Available. FINDINGS: A moderate volume of stool material is present in the right colon. A moderate to large volume of well-formed stool material is favored to be in the left colon and rectum. IMPRESSION: 1. Imaging features suggestive of constipation. Electronically Signed   By: Reagan Camera M.D.   On: 09/21/2023 16:08    Procedures Procedures    Medications Ordered in ED Medications  cefTRIAXone  (ROCEPHIN ) 1 g in sodium chloride  0.9 % 100 mL  IVPB (0 g Intravenous Stopped 09/21/23 2207)    ED Course/ Medical Decision Making/ A&P                                 Medical Decision Making Amount and/or Complexity of Data Reviewed Radiology: ordered.   Patient is alert, afebrile, and hemodynamically stable in the ED in no acute distress.  Neurologic exam as noted above with left-sided hemibody deficits.  Abdominal exam also significant for soft abdomen with diffuse pain on palpation, worse in the upper quadrants, with no rebound tenderness or guarding.  In terms of patient's neurologic findings, great concern at this time is stroke.  Other items on the differential include infection, electrolyte derangement, dissection amongst other diagnoses.  Given patient's reported neck pain, will also obtain imaging of the C-spine.  Choosing to defer any contrasted imaging given her renal function.  Labs returned with no leukocytosis,  hemoglobin 8.8, unremarkable CMP with BUN 36 and creatinine 2.84, similar to patient's baseline.  Urinalysis resulted with large leukocytes and few bacteria, concerning for infection.  She was given Rocephin .  With CMP having reassuring LFTs, electrolytes, and creatinine at baseline, will defer any abdominal imaging.  She did receive x-ray imaging performed through triage, which demonstrated large stool burden.   MRI was obtained and demonstrated 4 mm acute ischemic left pontine infarct. No associated mass effect or hemorrhage.  CT C-spine with no acute findings, but degenerative changes were noted with possible marrow edema.  Given patient's symptoms are very unilateral with intact left-sided strength, she does not require emergent evaluation by neurosurgery.  However, neurology was consulted for stroke, and they would like admission for further monitoring and management.  Patient may benefit from laxatives throughout her stay to help with bowel movements and her abdominal pain. Handoff was given to admitting team, and  patient was updated on workup and plan of care.   Patient seen in conjunction with Dr. Delana Favors, who agreed with the above work-up and plan of care.        Final Clinical Impression(s) / ED Diagnoses Final diagnoses:  Cerebral infarction, unspecified mechanism [facility])    Rx / DC Orders ED Discharge Orders     None         Lorain Robson, MD 09/21/23 2314    Dalene Duck, MD 10/05/23 [phone]

## 2023-09-21 NOTE — ED Triage Notes (Addendum)
 Pt states she woke up at 0800 with increased weakness in right arm and leg that was more than her usual weakness and she also has pain in right arm and leg when she tries to move them. Pt states she has intermittent headaches and her mouth feels dry. Pt states she is having intermittent right upper quadrant abdominal pain that started yesterday. Pt states she has had several strokes in the past and these symptoms are different than previous strokes.

## 2023-09-22 ENCOUNTER — Other Ambulatory Visit (HOSPITAL_COMMUNITY)

## 2023-09-22 ENCOUNTER — Observation Stay (HOSPITAL_COMMUNITY)

## 2023-09-22 DIAGNOSIS — I739 Peripheral vascular disease, unspecified: Secondary | ICD-10-CM | POA: Diagnosis not present

## 2023-09-22 DIAGNOSIS — G4733 Obstructive sleep apnea (adult) (pediatric): Secondary | ICD-10-CM | POA: Diagnosis not present

## 2023-09-22 DIAGNOSIS — I639 Cerebral infarction, unspecified: Secondary | ICD-10-CM | POA: Diagnosis not present

## 2023-09-22 DIAGNOSIS — Z7902 Long term (current) use of antithrombotics/antiplatelets: Secondary | ICD-10-CM | POA: Diagnosis not present

## 2023-09-22 DIAGNOSIS — I6389 Other cerebral infarction: Secondary | ICD-10-CM | POA: Diagnosis not present

## 2023-09-22 DIAGNOSIS — N184 Chronic kidney disease, stage 4 (severe): Secondary | ICD-10-CM

## 2023-09-22 DIAGNOSIS — R29702 NIHSS score 2: Secondary | ICD-10-CM

## 2023-09-22 DIAGNOSIS — E1122 Type 2 diabetes mellitus with diabetic chronic kidney disease: Secondary | ICD-10-CM | POA: Diagnosis not present

## 2023-09-22 DIAGNOSIS — E1159 Type 2 diabetes mellitus with other circulatory complications: Secondary | ICD-10-CM

## 2023-09-22 DIAGNOSIS — I152 Hypertension secondary to endocrine disorders: Secondary | ICD-10-CM

## 2023-09-22 LAB — BASIC METABOLIC PANEL WITH GFR
Anion gap: 10 (ref 5–15)
BUN: 34 mg/dL — ABNORMAL HIGH (ref 6–20)
CO2: 22 mmol/L (ref 22–32)
Calcium: 9.3 mg/dL (ref 8.9–10.3)
Chloride: 112 mmol/L — ABNORMAL HIGH (ref 98–111)
Creatinine, Ser: 2.94 mg/dL — ABNORMAL HIGH (ref 0.44–1.00)
GFR, Estimated: 19 mL/min — ABNORMAL LOW (ref >60)
Glucose, Bld: 102 mg/dL — ABNORMAL HIGH (ref 70–99)
Potassium: 3.8 mmol/L (ref 3.5–5.1)
Sodium: 144 mmol/L (ref 135–145)

## 2023-09-22 LAB — ECHOCARDIOGRAM COMPLETE
AR max vel: 2.3 cm2
AV Peak grad: 6.5 mmHg
Ao pk vel: 1.27 m/s
Area-P 1/2: 4.31 cm2
Height: 62.5 in
S' Lateral: 2.5 cm
Weight: 2096 [oz_av]

## 2023-09-22 LAB — GLUCOSE, CAPILLARY
Glucose-Capillary: 121 mg/dL — ABNORMAL HIGH (ref 70–99)
Glucose-Capillary: 90 mg/dL (ref 70–99)
Glucose-Capillary: 127 mg/dL — ABNORMAL HIGH (ref 70–99)
Glucose-Capillary: 107 mg/dL — ABNORMAL HIGH (ref 70–99)

## 2023-09-22 LAB — RAPID URINE DRUG SCREEN, HOSP PERFORMED
Amphetamines: NOT DETECTED
Barbiturates: NOT DETECTED
Benzodiazepines: NOT DETECTED
Cocaine: NOT DETECTED
Opiates: NOT DETECTED
Tetrahydrocannabinol: NOT DETECTED

## 2023-09-22 LAB — CBC
HCT: 24.5 % — ABNORMAL LOW (ref 36.0–46.0)
Hemoglobin: 8.1 g/dL — ABNORMAL LOW (ref 12.0–15.0)
MCH: 30 pg (ref 26.0–34.0)
MCHC: 33.1 g/dL (ref 30.0–36.0)
MCV: 90.7 fL (ref 80.0–100.0)
Platelets: 201 10*3/uL (ref 150–400)
RBC: 2.7 MIL/uL — ABNORMAL LOW (ref 3.87–5.11)
RDW: 15.9 % — ABNORMAL HIGH (ref 11.5–15.5)
WBC: 3.9 10*3/uL — ABNORMAL LOW (ref 4.0–10.5)
nRBC: 0 % (ref 0.0–0.2)

## 2023-09-22 LAB — VITAMIN B12: Vitamin B-12: 735 pg/mL (ref 180–914)

## 2023-09-22 LAB — ANTITHROMBIN III: AntiThromb III Func: 92 % (ref 75–120)

## 2023-09-22 LAB — LIPID PANEL
Cholesterol: 128 mg/dL (ref 0–200)
HDL: 72 mg/dL (ref >40)
LDL Cholesterol: 44 mg/dL (ref 0–99)
Total CHOL/HDL Ratio: 1.8 ratio
Triglycerides: 59 mg/dL (ref <150)
VLDL: 12 mg/dL (ref 0–40)

## 2023-09-22 LAB — HIV ANTIBODY (ROUTINE TESTING W REFLEX): HIV Screen 4th Generation wRfx: NONREACTIVE

## 2023-09-22 LAB — TSH: TSH: 3.49 u[IU]/mL (ref 0.350–4.500)

## 2023-09-22 LAB — HEMOGLOBIN A1C
Hgb A1c MFr Bld: 5.1 % (ref 4.8–5.6)
Mean Plasma Glucose: 99.67 mg/dL

## 2023-09-22 MED ORDER — HEPARIN SODIUM (PORCINE) 5000 UNIT/ML IJ SOLN
5000.0000 [IU] | Freq: Three times a day (TID) | INTRAMUSCULAR | Status: DC
  Administered 2023-09-22 – 2023-09-27 (×12): 5000 [IU] via SUBCUTANEOUS
  Filled 2023-09-22 (×14): qty 1

## 2023-09-22 MED ORDER — MELATONIN 5 MG PO TABS: 5.0000 mg | ORAL_TABLET | Freq: Every evening | ORAL | Status: DC | PRN

## 2023-09-22 MED ORDER — FLUOXETINE HCL 20 MG PO CAPS
20.0000 mg | ORAL_CAPSULE | Freq: Every day | ORAL | Status: DC
  Administered 2023-09-22 – 2023-09-29 (×8): 20 mg via ORAL
  Filled 2023-09-22 (×8): qty 1

## 2023-09-22 MED ORDER — SODIUM BICARBONATE 650 MG PO TABS
650.0000 mg | ORAL_TABLET | Freq: Three times a day (TID) | ORAL | Status: DC
  Administered 2023-09-22 – 2023-09-23 (×6): 650 mg via ORAL
  Filled 2023-09-22 (×6): qty 1

## 2023-09-22 MED ORDER — ACETAMINOPHEN 650 MG RE SUPP: 650.0000 mg | RECTAL | Status: DC | PRN

## 2023-09-22 MED ORDER — PROCHLORPERAZINE EDISYLATE 10 MG/2ML IJ SOLN: 10.0000 mg | Freq: Four times a day (QID) | INTRAMUSCULAR | Status: DC | PRN

## 2023-09-22 MED ORDER — ALBUTEROL SULFATE (2.5 MG/3ML) 0.083% IN NEBU: 2.5000 mg | INHALATION_SOLUTION | Freq: Four times a day (QID) | RESPIRATORY_TRACT | Status: DC | PRN

## 2023-09-22 MED ORDER — STROKE: EARLY STAGES OF RECOVERY BOOK
Freq: Once | Status: AC
  Filled 2023-09-22: qty 1

## 2023-09-22 MED ORDER — ASPIRIN 81 MG PO TBEC
81.0000 mg | DELAYED_RELEASE_TABLET | Freq: Two times a day (BID) | ORAL | Status: DC
Start: 2023-09-22 — End: 2023-09-29
  Administered 2023-09-22 – 2023-09-29 (×16): 81 mg via ORAL
  Filled 2023-09-22 (×16): qty 1

## 2023-09-22 MED ORDER — PANTOPRAZOLE SODIUM 40 MG PO TBEC
40.0000 mg | DELAYED_RELEASE_TABLET | Freq: Two times a day (BID) | ORAL | Status: DC
  Administered 2023-09-22 – 2023-09-29 (×16): 40 mg via ORAL
  Filled 2023-09-22 (×16): qty 1

## 2023-09-22 MED ORDER — POLYETHYLENE GLYCOL 3350 17 G PO PACK
17.0000 g | PACK | Freq: Two times a day (BID) | ORAL | Status: DC
  Administered 2023-09-22 – 2023-09-23 (×4): 17 g via ORAL
  Filled 2023-09-22 (×4): qty 1

## 2023-09-22 MED ORDER — TICAGRELOR 90 MG PO TABS
90.0000 mg | ORAL_TABLET | Freq: Two times a day (BID) | ORAL | Status: DC
  Administered 2023-09-22 (×2): 90 mg via ORAL
  Filled 2023-09-22 (×2): qty 1

## 2023-09-22 MED ORDER — MONTELUKAST SODIUM 10 MG PO TABS
10.0000 mg | ORAL_TABLET | Freq: Every day | ORAL | Status: DC
  Administered 2023-09-22 – 2023-09-28 (×8): 10 mg via ORAL
  Filled 2023-09-22 (×8): qty 1

## 2023-09-22 MED ORDER — ACETAMINOPHEN 325 MG PO TABS
650.0000 mg | ORAL_TABLET | ORAL | Status: DC | PRN
  Administered 2023-09-23: 650 mg via ORAL
  Filled 2023-09-22: qty 2

## 2023-09-22 MED ORDER — SENNOSIDES-DOCUSATE SODIUM 8.6-50 MG PO TABS
1.0000 | ORAL_TABLET | Freq: Two times a day (BID) | ORAL | Status: DC
  Administered 2023-09-22 – 2023-09-28 (×14): 1 via ORAL
  Filled 2023-09-22 (×14): qty 1

## 2023-09-22 MED ORDER — SENNOSIDES-DOCUSATE SODIUM 8.6-50 MG PO TABS: 1.0000 | ORAL_TABLET | Freq: Every evening | ORAL | Status: DC | PRN

## 2023-09-22 MED ORDER — ATORVASTATIN CALCIUM 80 MG PO TABS
80.0000 mg | ORAL_TABLET | Freq: Every day | ORAL | Status: DC
Start: 2023-09-22 — End: 2023-09-29
  Administered 2023-09-22 – 2023-09-28 (×8): 80 mg via ORAL
  Filled 2023-09-22 (×4): qty 1
  Filled 2023-09-22: qty 2
  Filled 2023-09-22 (×3): qty 1

## 2023-09-22 MED ORDER — ACETAMINOPHEN 160 MG/5ML PO SOLN: 650.0000 mg | ORAL | Status: DC | PRN

## 2023-09-22 MED ORDER — INSULIN ASPART 100 UNIT/ML IJ SOLN
0.0000 [IU] | Freq: Three times a day (TID) | INTRAMUSCULAR | Status: DC
  Administered 2023-09-22 – 2023-09-27 (×4): 1 [IU] via SUBCUTANEOUS

## 2023-09-22 NOTE — Evaluation (Signed)
 Physical Therapy Evaluation Patient Details Name: Christina Orozco MRN: [id number] DOB: 1970-11-14 Today's Date: 09/22/2023  History of Present Illness  Pt is 53 yo female admitted on 09/21/23 with R sided weakness found to have L pontine infarct.  Pt with hx including but not limited to DM, HTN, CKD, anemia, PVD, recurrent CVAs (pt reports one every other month in 2024)  Clinical Impression  Pt admitted with above diagnosis. Pt has had multiple CVA in the past but has recovered fairly well with some R residual weakness.  She is normally independent using a rollator for short community distance ambulation.  She does have an PCA 3 x week that assist mostly with IADLs.  Today, pt alert and oriented and cognitively intact.  She is able to answer all questions, follow multistep commands, and provide detailed history.  With strength testing bil LE appear equal; however, with ambulation pt with obvious deficits on R (trendlenberg, foot drop, knee hyperextension) that appear with fatigue.  She ambulated 7' with CGA using RW.  Pt motivated and expected to progress well with PT.  She would benefit from HHPT at d/c.  Pt currently with functional limitations due to the deficits listed below (see PT Problem List). Pt will benefit from acute skilled PT to increase their independence and safety with mobility to allow discharge.            If plan is discharge home, recommend the following: A little help with walking and/or transfers;A little help with bathing/dressing/bathroom;Assistance with cooking/housework;Help with stairs or ramp for entrance   Can travel by private vehicle        Equipment Recommendations None recommended by PT  Recommendations for Other Services       Functional Status Assessment Patient has had a recent decline in their functional status and demonstrates the ability to make significant improvements in function in a reasonable and predictable amount of time.     Precautions /  Restrictions Precautions Precautions: Fall      Mobility  Bed Mobility Overal bed mobility: Needs Assistance Bed Mobility: Supine to Sit, Sit to Supine     Supine to sit: Supervision Sit to supine: Supervision   General bed mobility comments: increased time    Transfers Overall transfer level: Needs assistance Equipment used: Rolling walker (2 wheels) Transfers: Sit to/from Stand Sit to Stand: Contact guard assist           General transfer comment: CGA for safety; cues for hand placement    Ambulation/Gait Ambulation/Gait assistance: Contact guard assist Gait Distance (Feet): 80 Feet Assistive device: Rolling walker (2 wheels) Gait Pattern/deviations: Step-to pattern, Decreased stride length, Decreased dorsiflexion - right Gait velocity: decreased but functional     General Gait Details: CGA for safety; with fatigue pt with decreased DF on R compensating with steppage pattern at times; also with knee hyperextension on R with fatigue - some improvement with cues to step inside RW and use arms more  Stairs            Wheelchair Mobility     Tilt Bed    Modified Rankin (Stroke Patients Only) Modified Rankin (Stroke Patients Only) Pre-Morbid Rankin Score: Slight disability Modified Rankin: Moderate disability     Balance Overall balance assessment: Needs assistance Sitting-balance support: No upper extremity supported Sitting balance-Leahy Scale: Good     Standing balance support: Bilateral upper extremity supported, Reliant on assistive device for balance Standing balance-Leahy Scale: Poor Standing balance comment: steady wtih RW  Pertinent Vitals/Pain Pain Assessment Pain Assessment: 0-10 Pain Score: 7  Pain Location: buttock Pain Descriptors / Indicators: Discomfort Pain Intervention(s): Limited activity within patient's tolerance, Monitored during session, Repositioned    Home Living Family/patient  expects to be discharged to:: Private residence Living Arrangements: Alone (daughter will be home from college in summer) Available Help at Discharge: Family;Available PRN/intermittently;Personal care attendant Type of Home: Apartment Home Access: Level entry       Home Layout: One level Home Equipment: Tub bench;Grab bars - tub/shower;Rollator (4 wheels);Rolling Walker (2 wheels);Cane - single point;BSC/3in1 Additional Comments: CPAP at night; Does have PCA 3 x week for 1-2 hour that mainly assist with IADLs or ADls if needed    Prior Function               Mobility Comments: Ambulates with rollator - limited community ambulatory; denies recent falls ADLs Comments: Pt mostly does ADLs on her own (PCA assist at times); PCA assist with IADLs; uses [facility] or calls DSS for transportation to MD appointments     Extremity/Trunk Assessment   Upper Extremity Assessment Upper Extremity Assessment: Defer to OT evaluation    Lower Extremity Assessment Lower Extremity Assessment: LLE deficits/detail;RLE deficits/detail RLE Deficits / Details: ROM: WFL; MMT: ankle DF 5/5, knee ext 5/5, hip flex 5/5; however, with gait pt with obvious strength deficits particularly with fatigue indicated by decreased DF at times, steppage gait, and R knee hyperextending RLE Sensation: WNL LLE Deficits / Details: ROM WFL; MMT 5/5 LLE Sensation: WNL    Cervical / Trunk Assessment Cervical / Trunk Assessment: Normal  Communication        Cognition Arousal: Alert Behavior During Therapy: WFL for tasks assessed/performed   PT - Cognitive impairments: No apparent impairments                                 Cueing       General Comments      Exercises     Assessment/Plan    PT Assessment Patient needs continued PT services  PT Problem List Decreased strength;Decreased range of motion;Decreased activity tolerance;Decreased balance;Decreased mobility;Decreased knowledge of use of  DME       PT Treatment Interventions DME instruction;Therapeutic exercise;Gait training;Stair training;Functional mobility training;Therapeutic activities;Patient/family education;Neuromuscular re-education;Modalities;Balance training    PT Goals (Current goals can be found in the Care Plan section)  Acute Rehab PT Goals Patient Stated Goal: return home PT Goal Formulation: With patient Time For Goal Achievement: 10/06/23 Potential to Achieve Goals: Good Additional Goals Additional Goal #1: Pt will control R knee hyperextension during gait for safer gait pattern    Frequency Min 2X/week     Co-evaluation               AM-PAC PT "6 Clicks" Mobility  Outcome Measure Help needed turning from your back to your side while in a flat bed without using bedrails?: None Help needed moving from lying on your back to sitting on the side of a flat bed without using bedrails?: A Little Help needed moving to and from a bed to a chair (including a wheelchair)?: A Little Help needed standing up from a chair using your arms (e.g., wheelchair or bedside chair)?: A Little Help needed to walk in hospital room?: A Little Help needed climbing 3-5 steps with a railing? : A Little 6 Click Score: 19    End of Session Equipment Utilized During Treatment: Gait belt  Activity Tolerance: Patient tolerated treatment well Patient left: in bed;with call bell/phone within reach;with bed alarm set Nurse Communication: Mobility status PT Visit Diagnosis: Other abnormalities of gait and mobility (R26.89);Hemiplegia and hemiparesis Hemiplegia - Right/Left: Right Hemiplegia - dominant/non-dominant: Dominant Hemiplegia - caused by: Cerebral infarction    Time: 1610-9604 PT Time Calculation (min) (ACUTE ONLY): 29 min   Charges:   PT Evaluation $PT Eval Low Complexity: 1 Low PT Treatments $Gait Training: 8-22 mins PT General Charges $$ ACUTE PT VISIT: 1 Visit         Cyd Dowse, PT Acute Rehab  [facility] [phone]   Carolynn Citrin 09/22/2023, 2:41 PM

## 2023-09-22 NOTE — Progress Notes (Addendum)
 STROKE TEAM PROGRESS NOTE   SUBJECTIVE (INTERVAL HISTORY) No family is at the bedside.  Overall her condition is stable. She stated that she still has worsening right sided weakness, more than her baseline. She complaint with ASA and brilinta. Only missed one dose last week for the lipitor. Her A1C and LDL are in good control. MRI showed left pontine infarct    OBJECTIVE Temp:  [98.1 F (36.7 C)-99.1 F (37.3 C)] 98.2 F (36.8 C) (04/12 1231) Pulse Rate:  [72-90] 76 (04/12 1231) Cardiac Rhythm: Normal sinus rhythm (04/12 0712) Resp:  [17-19] 18 (04/12 1231) BP: (144-161)/(78-87) 154/78 (04/12 1231) SpO2:  [100 %] 100 % (04/12 1231)  Recent Labs  Lab 09/22/23 0622 09/22/23 1231  GLUCAP 121* 90   Recent Labs  Lab 09/21/23 1421 09/22/23 0815  NA 142 144  K 4.3 3.8  CL 109 112*  CO2 21* 22  GLUCOSE 97 102*  BUN 36* 34*  CREATININE 2.84* 2.94*  CALCIUM 9.3 9.3   Recent Labs  Lab 09/21/23 1421  AST 33  ALT 35  ALKPHOS 43  BILITOT 0.7  PROT 7.3  ALBUMIN 4.0   Recent Labs  Lab 09/21/23 1421 09/22/23 0815  WBC 3.8* 3.9*  NEUTROABS 1.8  --   HGB 8.8* 8.1*  HCT 27.1* 24.5*  MCV 90.9 90.7  PLT 202 201   No results for input(s): "CKTOTAL", "CKMB", "CKMBINDEX", "TROPONINI" in the last 168 hours. No results for input(s): "LABPROT", "INR" in the last 72 hours. Recent Labs    09/21/23 1725  COLORURINE YELLOW  LABSPEC 1.010  PHURINE 5.0  GLUCOSEU NEGATIVE  HGBUR NEGATIVE  BILIRUBINUR NEGATIVE  KETONESUR NEGATIVE  PROTEINUR 30*  NITRITE NEGATIVE  LEUKOCYTESUR LARGE*       Component Value Date/Time   CHOL 128 09/22/2023 0815   CHOL 135 06/17/2020 1053   TRIG 59 09/22/2023 0815   HDL 72 09/22/2023 0815   HDL 47 06/17/2020 1053   CHOLHDL 1.8 09/22/2023 0815   VLDL 12 09/22/2023 0815   LDLCALC 44 09/22/2023 0815   LDLCALC 63 06/17/2020 1053   Lab Results  Component Value Date   HGBA1C 5.1 09/22/2023      Component Value Date/Time   LABOPIA NONE  DETECTED 10/27/2022 2357   COCAINSCRNUR NONE DETECTED 10/27/2022 2357   LABBENZ NONE DETECTED 10/27/2022 2357   AMPHETMU NONE DETECTED 10/27/2022 2357   THCU NONE DETECTED 10/27/2022 2357   LABBARB NONE DETECTED 10/27/2022 2357    No results for input(s): "ETH" in the last 168 hours.  I have personally reviewed the radiological images below and agree with the radiology interpretations.  MR ANGIO HEAD WO CONTRAST Result Date: 09/22/2023 CLINICAL DATA:  Follow-up examination for stroke. EXAM: MRA HEAD WITHOUT CONTRAST TECHNIQUE: Angiographic images of the Circle of Willis were acquired using MRA technique without intravenous contrast. COMPARISON:  Prior MRI from 09/21/2023. FINDINGS: Anterior circulation: Both internal carotid arteries widely patent to the termini without stenosis. A1 segments widely patent. Normal anterior communicating artery complex. Both anterior cerebral arteries widely patent to their distal aspects without stenosis. No M1 stenosis or occlusion. Normal MCA bifurcations. Distal MCA branches well perfused and symmetric. Posterior circulation: Visualized distal V4 segments widely patent. Left vertebral artery dominant. Left PICA patent. Right PICA not seen. Basilar patent without stenosis. Superior cerebellar and posterior cerebral arteries patent bilaterally. Anatomic variants: As above. Other: No intracranial aneurysm. Small lipoma noted at the right frontal scalp. IMPRESSION: Normal intracranial MRA. Electronically Signed   By: Amye Baller  Tresea Frost M.D.   On: 09/22/2023 02:55   MR Cervical Spine Wo Contrast Result Date: 09/21/2023 CLINICAL DATA:  Initial evaluation for acute neck pain. EXAM: MRI CERVICAL SPINE WITHOUT CONTRAST TECHNIQUE: Multiplanar, multisequence MR imaging of the cervical spine was performed. No intravenous contrast was administered. COMPARISON:  And MRI from 07/05/2015. FINDINGS: Alignment: Straightening with mild smooth reversal of the normal cervical  lordosis. No significant listhesis. Vertebrae: Prior ACDF at C5-6 with solid arthrodesis. Vertebral body height maintained without acute or chronic fracture. Atypical hemangioma noted within the T1 vertebral body. No other worrisome osseous lesions. Degenerative reactive endplate change with marrow edema present about the C3-4 interspace. No other abnormal marrow edema. Cord: Normal signal and morphology. Posterior Fossa, vertebral arteries, paraspinal tissues: Changes of severe chronic microvascular ischemic disease noted within the pons. Craniocervical junction within normal limits. Paraspinous soft tissues within normal limits. Normal flow voids seen within the vertebral arteries bilaterally. Disc levels: C2-C3: Unremarkable. C3-C4: Degenerative intervertebral disc space narrowing with diffuse disc osteophyte complex. Broad posterior component flattens and effaces the ventral thecal sac. Mild cord flattening without cord signal changes. Mild to moderate spinal stenosis. Foramina remain patent. C4-C5: Small central disc protrusion indents the ventral thecal sac. No significant spinal stenosis. Mild uncovertebral spurring without significant foraminal encroachment. C5-C6: Prior fusion. No residual spinal stenosis. Foramina appear patent. C6-C7: Mild disc bulge with uncovertebral spurring. No spinal stenosis. Foramina remain patent. C7-T1: Normal interspace. Minimal right-sided facet hypertrophy. No canal or foraminal stenosis. IMPRESSION: 1. Degenerative disc osteophyte complex at C3-4 with resultant mild to moderate spinal stenosis. Degenerative reactive endplate change with marrow edema at this level could contribute to neck pain. 2. Prior ACDF at C5-6 without residual or recurrent stenosis. 3. Small central disc protrusion at C4-5 without significant stenosis. Electronically Signed   By: Virgia Griffins M.D.   On: 09/21/2023 21:57   MR BRAIN WO CONTRAST Result Date: 09/21/2023 CLINICAL DATA:  Initial  evaluation for acute neuro deficit, right-sided symptoms. EXAM: MRI HEAD WITHOUT CONTRAST TECHNIQUE: Multiplanar, multiecho pulse sequences of the brain and surrounding structures were obtained without intravenous contrast. COMPARISON:  Comparison made with prior CT from earlier the same day as well as previous MRI from 04/14/2023 FINDINGS: Brain: Generalized cerebral atrophy, most pronounced about the brainstem, advanced for age. Extensive T2/FLAIR hyperintensity involving the periventricular deep white matter both cerebral hemispheres, with patchy involvement of the deep gray nuclei, brainstem, and cerebellum, most likely related to advanced chronic microvascular ischemic disease. Multiple remote lacunar infarcts present about the bilateral basal ganglia, thalami, and pons. 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 No associated mass effect or convincing associated hemorrhage, although evaluation limited as there is multiple chronic micro hemorrhages at this location. No other evidence for acute or subacute ischemia. Gray-white matter differentiation otherwise maintained. No acute intracranial hemorrhage. Multiple chronic micro hemorrhages noted, most pronounced about the pons and thalami, most characteristic of chronic poorly controlled hypertension. No mass lesion, midline shift or mass effect. No hydrocephalus or extra-axial fluid collection. Pituitary gland within normal limits. Vascular: Major intracranial vascular flow voids are maintained. Skull and upper cervical spine: Craniocervical junction within normal limits. Bone marrow signal intensity normal. No scalp soft tissue abnormality. Sinuses/Orbits: Prior bilateral ocular lens replacement. Paranasal sinuses are clear. No significant mastoid effusion. Other: None. IMPRESSION: 1. 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 No associated mass effect or hemorrhage. 2. Underlying advanced chronic microvascular ischemic disease, with multiple  remote lacunar infarcts about the bilateral  basal ganglia, thalami, and pons. 3. Multiple chronic micro hemorrhages, most pronounced about the pons and thalami, most characteristic of chronic poorly controlled hypertension. Electronically Signed   By: Virgia Griffins M.D.   On: 09/21/2023 21:51   CT Head Wo Contrast Result Date: 09/21/2023 CLINICAL DATA:  Right-sided weakness worse than baseline since waking up at 8 a.m. today. EXAM: CT HEAD WITHOUT CONTRAST TECHNIQUE: Contiguous axial images were obtained from the base of the skull through the vertex without intravenous contrast. RADIATION DOSE REDUCTION: This exam was performed according to the departmental dose-optimization program which includes automated exposure control, adjustment of the mA and/or kV according to patient size and/or use of iterative reconstruction technique. COMPARISON:  CT head without contrast 04/14/2023. MRI head 05/28/2023. FINDINGS: Brain: Multiple remote lacunar infarcts are again noted in the thalami bilaterally. Extensive periventricular white matter changes are present bilaterally. No acute infarct, hemorrhage, or mass lesion is present. Moderate confluent white matter hypoattenuation is stable. Basal ganglia are otherwise within normal limits. The insular ribbon is normal bilaterally. No acute or focal cortical abnormality is present. Extensive white matter changes are present the brainstem and right greater than left cerebellar peduncles, similar the prior exam. No acute infarct or hemorrhage is present within the posterior fossa. Midline structures are within normal limits. Vascular: Atherosclerotic calcifications are present within the cavernous internal carotid arteries bilaterally. No hyperdense vessel is present. Skull: Calvarium is intact. No focal lytic or blastic lesions are present. No significant extracranial soft tissue lesion is present. Sinuses/Orbits: The paranasal sinuses and mastoid air cells are clear.  Bilateral lens replacements are noted. Globes and orbits are otherwise unremarkable. IMPRESSION: 1. No acute intracranial abnormality or significant interval change. 2. Multiple remote lacunar infarcts in the thalami bilaterally. 3. Stable extensive periventricular white matter disease bilaterally is stable. This likely reflects the sequela of chronic microvascular ischemia. Electronically Signed   By: Audree Leas M.D.   On: 09/21/2023 18:18   DG Abdomen 1 View Result Date: 09/21/2023 CLINICAL DATA:  Constipation EXAM: ABDOMEN - 1 VIEW COMPARISON:  None Available. FINDINGS: A moderate volume of stool material is present in the right colon. A moderate to large volume of well-formed stool material is favored to be in the left colon and rectum. IMPRESSION: 1. Imaging features suggestive of constipation. Electronically Signed   By: Reagan Camera M.D.   On: 09/21/2023 16:08   CT VIRTUAL COLONOSCOPY DIAGNOSTIC Result Date: 09/08/2023 CLINICAL DATA:  Occult blood positive stool EXAM: CT VIRTUAL COLONOSCOPY DIAGNOSTIC TECHNIQUE: The patient was given a standard bowel preparation with Gastrografin and barium for fluid and stool tagging respectively. The quality of the bowel preparation is fair. Automated CO2 insufflation of the colon was performed prior to image acquisition and colonic distention is good. Image post processing was used to generate a 3D endoluminal fly-through projection of the colon and to electronically subtract stool/fluid as appropriate. COMPARISON:  11/21/2021 FINDINGS: VIRTUAL COLONOSCOPY Moderate amount of retained barium within the colon. Tortuous transverse colon and sigmoid colon. No fixed polypoid filling defects or annular constricting lesions. Virtual colonoscopy is not designed to detect diminutive polyps (i.e., less than or equal to 5 mm), the presence or absence of which may not affect clinical management. CT ABDOMEN AND PELVIS WITHOUT CONTRAST Lower chest: No acute findings  Hepatobiliary: No focal hepatic abnormality. Gallbladder unremarkable. Pancreas: No focal abnormality or ductal dilatation. Spleen: No focal abnormality.  Normal size. Adrenals/Urinary Tract: Stable 6 mm stone in the lower pole of the right kidney.  No ureteral stones or hydronephrosis. Adrenal glands and urinary bladder unremarkable. Stomach/Bowel: Stomach and small bowel decompressed unremarkable. Vascular/Lymphatic: Aortic atherosclerosis. No evidence of aneurysm or adenopathy. Reproductive: Uterus and adnexa unremarkable.  No mass. Other: No free fluid or free air. Musculoskeletal: No acute bony abnormality. IMPRESSION: Tortuous colon. No fixed polypoid filling defects or annular constricting lesions. Aortic atherosclerosis. Right nephrolithiasis.  No hydronephrosis. No acute findings. Electronically Signed   By: Janeece Mechanic M.D.   On: 09/08/2023 00:16     PHYSICAL EXAM  Temp:  [98.1 F (36.7 C)-99.1 F (37.3 C)] 98.2 F (36.8 C) (04/12 1231) Pulse Rate:  [72-90] 76 (04/12 1231) Resp:  [17-19] 18 (04/12 1231) BP: (144-161)/(78-87) 154/78 (04/12 1231) SpO2:  [100 %] 100 % (04/12 1231)  General - Well nourished, well developed, in no apparent distress, on nasal pillow use for OSA.  Ophthalmologic - fundi not visualized due to noncooperation.  Cardiovascular - Regular rhythm and rate.  Neuro - awake, alert, eyes open, orientated to age, place, time and people. No aphasia, fluent language, following all simple commands. Able to name and repeat. No gaze palsy, tracking bilaterally, visual field full, PERRL. No facial droop. Tongue midline. Bilateral UEs 4/5, no drift. Bilaterally LEs 3/5, no drift. Sensation symmetrical bilaterally, b/l FTN and HTS ataxia, gait not tested.    ASSESSMENT/PLAN Christina Orozco is a 53 y.o. female with history of recurrent stroke, hypertension, hyperlipidemia, diabetes, PVD, CKD 4, seizure, OSA on CPAP admitted for right-sided weakness. No TNK given due to  outside the window.    Stroke:  left pontine small infarct, etiology not quite clear now given frequently recurrent stroke CT no acute finding MRI small left pontine infarct MRA unremarkable Carotid Doppler pending 2D Echo pending Consider cerebral angiogram to rule out CNS vasculitis Consider LP to rule out CNS vasculitis LDL 44 HgbA1c 5.1 Hypercoagulable workup pending Autoimmune workup pending UDS pending Heparin subcu for VTE prophylaxis aspirin 81 mg daily and Brilinta (ticagrelor) 90 mg bid prior to admission, now on aspirin 81 mg daily in preparation for LP.  Patient counseled to be compliant with her antithrombotic medications Ongoing aggressive stroke risk factor management Therapy recommendations: Home health PT and OT Disposition: Pending  History of stroke 08/2015 CSF neg, protein 58 07/2017 MRI showed left pontine infarct, EF 65 to 70%, carotid Doppler and MRA negative.  LDL 43, A1c 12.5, patient put on DAPT and Lipitor 40. 08/2017 Loop recorder placed, no A. fib found 02/27/2019 woke up with complete vision loss, gradually vision resumed OD, still has better vision OS.  MRI at that time showed 3 mm acute to early subacute infarct in the dorsal midline of the pons negative MRA head and neck. 07/2019 loop recorder explanted 05/01/2020 MRI showed new T2 hyperintense lesion of the right frontal white matter likely reflect an area of late subacute or chronic infarction. 06/19/2020 Subcentimeter likely subacute infarct at the left lateral aspect of the pons.  MRA head unremarkable, carotid Doppler unremarkable, EF 70 to 75%.  LDL 63, A1c 6.2.  Put on aspirin and Brilinta for 1 months and then back to aspirin and the Plavix.  Continue Lipitor 80. 11/10/2021 - MRI showed possible small right cerebral peduncle infarcts.  MRA negative.  Carotid Doppler unremarkable.  EF 60 to 65%, LDL 34, A1c 6.5, discharged with ASA and brilinta 90 twice daily.  Outpatient follow-up with [facility],  continued on aspirin and Brilinta 60 twice daily. 12/2021 admitted for right frontal SO and small  left pontine infarct, MRA head and neck negative, ESR and CRP negative, LDL 36, A1c 6.3, P2Y12 = 39, continued on aspirin and Brilinta. 08/2022 admitted for left-sided weakness, dizziness and aphasia.  MRI showed left BG punctate infarct.  CTA head and neck unremarkable.  LDL 35, A1c 5.9.  EF 55 to 70%. 10/2022 admitted for double vision, MRI showed right temporal lobe and left CR, left lateral splenium infarcts. 12/2022 ESR and CRP negative, free protein S antigen normal, protein C activity normal, Antithrombin III normal, homocystine level 23.6, antiphospholipid antibody negative 01/2023 hypercoagulable workup showed homocystine 23.7, free protein S antigen 59, antiphospholipid antibodies negative, factor V Leyden mutation negative, Antithrombin III activity normal, ACE negative 04/2023 admitted for decreased vision, diplopia and nausea vomiting.  MRI showed right frontal parietal small infarct, questionable right splenium small infarct.  Continue aspirin Brilinta as well as Lipitor 80.  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, discharged with Keppra 03/2018 CSF showed WBC 30, RBC 0, protein 57, glucose 221, infectious work up neg  Off Keppra 05/2021, so far no recurrence Follow-up with [facility]  Diabetes HgbA1c 5.1 goal < 7.0 Controlled Home meds including Ozempic CBG monitoring SSI DM education and close PCP follow up  Hypertension Stable on the high end Avoid low BP Long term BP goal normotensive  Hyperlipidemia Home meds: Lipitor 80 LDL 44, goal < 70 Now on Lipitor 80 Continue statin at discharge  Other Stroke Risk Factors Obstructive sleep apnea, on CPAP at home  Other Active Problems CKD 4, creatinine 2.84 Mild leukopenia, WBCs 3.8 Depression on Prozac  Hospital day # 0  Patient condition with frequently recurrent strokes on maximized medical  treatment making medical decision complicated.  I discussed with Dr. Alfonse Angle and Dr. Laverta Potters neuro IR. I spent additional 30 minutes inpatient face-to-face time with the patient, more than 50% of which was spent in counseling and coordination of care, reviewing test results, images and medication, and discussing the diagnosis, treatment plan and potential prognosis. This patient's care requiresreview of multiple databases, neurological assessment, discussion with family, other specialists and medical decision making of high complexity.   Consuelo Denmark, MD PhD Stroke Neurology 09/22/2023 4:12 PM    To contact Stroke Continuity provider, please refer to [email or web address]. After hours, contact General Neurology

## 2023-09-22 NOTE — Progress Notes (Signed)
 [facility]  PROGRESS NOTE  Christl Fessenden JXB:[id number] DOB: Jan 26, 1971 DOA: 09/21/2023 PCP: Hampton Levins, PA-C   Brief HPI:     53 y.o. 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   Patient reports developing worsening right-sided weakness involving her arm and leg around 8 AM on 4/11.  She says she has gait imbalance at baseline from prior strokes.  She says she did not fall. CT head without contrast negative for acute intracranial normality or significant interval change.  Multiple remote lacunar infarcts in the thalami bilaterally and stable extensive periventricular white matter disease bilaterally noted.   MRI brain without contrast showed 4 mm acute ischemic left pontine infarct.  No associated mass effect or hemorrhage.  Underlying advanced chronic microvascular schema disease with multiple remote lacunar infarct involving bilateral basal ganglia, thalami, and pons.  Multiple chronic microhemorrhages most pronounced about the pons and thalami, most characteristic of poorly controlled hypertension.  Assessment/Plan:   Acute ischemic left pontine stroke History of recurrent CVAs with residual right-sided weakness: -Presented with worsening right-sided weakness -Found to have acute ischemic left pontine stroke on brain MRI -MRA of head and neck did not show large vessel occlusion -Follow echocardiogram - Keep on telemetry, continue neurochecks - Allowing permissive hypertension - Continue DAPT regimen with aspirin 81 mg BID and Brilinta 90 mg BID - Continue atorvastatin - PT/OT/SLP eval - Hemoglobin A1c 5.1 and lipid panel showed LDL 44 -Neurology to follow   CKD stage IV: Renal function stable on  admission.  Continue monitor.   Anemia of chronic disease: Hemoglobin 8.1   No obvious bleeding.   Continue to monitor.   Type 2 diabetes: Holding home meds and placed on SSI. CBG well-controlled   Hypertension: Holding home meds while allowing permissive hypertension.   Mild intermittent asthma: Stable, continue Singulair and albuterol as needed.   Hyperlipidemia: Continue atorvastatin.   Depression/anxiety: Continue fluoxetine.   Constipation: Start bowel regimen.   OSA: Continue CPAP nightly.    Medications     aspirin EC  81 mg Oral BID   atorvastatin  80 mg Oral QHS   FLUoxetine  20 mg Oral Daily   heparin  5,000 Units Subcutaneous Q8H   insulin aspart  0-9 Units Subcutaneous TID WC   montelukast  10 mg Oral QHS   pantoprazole  40 mg Oral BID   polyethylene glycol  17 g Oral BID   senna-docusate  1 tablet Oral BID   sodium bicarbonate  650 mg Oral TID   ticagrelor  90 mg Oral BID     Data Reviewed:   CBG:  Recent Labs  Lab 09/22/23 0622 09/22/23 1231  GLUCAP 121* 90    SpO2: 100 %    Vitals:   09/22/23 0408 09/22/23 0602 09/22/23 0758 09/22/23 1231  BP: (!) 145/83  (!) 155/87 (!) 154/78  Pulse: 78 73 88 76  Resp: 18 18 18 18   Temp: 98.1 F (36.7 C) 99.1 F (37.3 C) 98.6 F (37 C) 98.2 F (36.8 C)  TempSrc: Oral Oral Oral Oral  SpO2: 100% 100% 100% 100%  Weight:      Height:          Data Reviewed:  Basic Metabolic Panel: Recent Labs  Lab 09/21/23 1421 09/22/23 0815  NA 142 144  K 4.3 3.8  CL 109 112*  CO2 21* 22  GLUCOSE 97 102*  BUN 36* 34*  CREATININE 2.84* 2.94*  CALCIUM 9.3 9.3    CBC: Recent Labs  Lab 09/21/23 1421 09/22/23 0815  WBC 3.8* 3.9*  NEUTROABS 1.8  --   HGB 8.8* 8.1*  HCT 27.1* 24.5*  MCV 90.9 90.7  PLT 202 201    LFT Recent Labs  Lab 09/21/23 1421  AST 33  ALT 35  ALKPHOS 43  BILITOT 0.7  PROT 7.3  ALBUMIN 4.0     Antibiotics: Anti-infectives (From admission, onward)     Start     Dose/Rate Route Frequency Ordered Stop   09/21/23 1930  cefTRIAXone (ROCEPHIN) 1 g in sodium chloride 0.9 % 100 mL IVPB        1 g 200 mL/hr over 30 Minutes Intravenous  Once 09/21/23 1922 09/21/23 2207        DVT prophylaxis: Heparin  Code Status: Full code  Family Communication: No family at bedside   CONSULTS neurology   Subjective   No new complaints, has chronic right-sided weakness   Objective    Physical Examination:   General-appears in no acute distress Heart-S1-S2, regular, no murmur auscultated Lungs-clear to auscultation bilaterally, no wheezing or crackles auscultated Abdomen-soft, nontender, no organomegaly Extremities-no edema in the lower extremities Neuro-alert, oriented x3, mild weakness of right upper and lower extremity   Status is: Inpatient:             Ozell Blunt   [facility] If 7PM-7AM, please contact night-coverage at [email or web address], Office  [phone]   09/22/2023, 2:04 PM  LOS: 0 days

## 2023-09-22 NOTE — Consult Note (Signed)
 NEUROLOGY CONSULT NOTE   Date of service: September 22, 2023 Patient Name: Christina Orozco MRN:  [id number] DOB:  [birth year] Chief Complaint: "R sided weakness" Requesting Provider: Charlsie Quest, MD  History of Present Illness  Christina Orozco is a 53 y.o. female with hx of diabetes, hypertension, peripheral vascular disease, degenerative disc disease, GERD, pres, CKD, TIA who presents with right-sided weakness.  Patient went to bed at 2300 on 09/20/23 and woke up around 8 AM on 4/11 with some right-sided weakness involving her right arm and right leg.  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  Neurology was consulted further evaluation workup.   LKW: 2300 on 09/20/2023 Modified rankin score: 1-No significant post stroke disability and can perform usual duties with stroke symptoms IV Thrombolysis: Not offered, she is outside of window.   EVT: Not offered, low suspicion for LVO.    NIHSS components Score: Comment  1a Level of Conscious 0[x]  1[]  2[]  3[]      1b LOC Questions 0[x]  1[]  2[]       1c LOC Commands 0[x]  1[]  2[]       2 Best Gaze 0[x]  1[]  2[]       3 Visual 0[x]  1[]  2[]  3[]      4 Facial Palsy 0[x]  1[]  2[]  3[]      5a Motor Arm - left 0[x]  1[]  2[]  3[]  4[]  UN[]    5b Motor Arm - Right 0[x]  1[]  2[]  3[]  4[]  UN[]    6a Motor Leg - Left 0[x]  1[]  2[]  3[]  4[]  UN[]    6b Motor Leg - Right 0[]  1[x]  2[]  3[]  4[]  UN[]    7 Limb Ataxia 0[x]  1[]  2[]  3[]  UN[]     8 Sensory 0[]  1[x]  2[]  UN[]      9 Best Language 0[x]  1[]  2[]  3[]      10 Dysarthria 0[x]  1[]  2[]  UN[]      11 Extinct. and Inattention 0[x]  1[]  2[]       TOTAL: 2      ROS  Comprehensive ROS performed and pertinent positives documented in HPI   Past History   Past Medical History:  Diagnosis Date   Anemia    severe   Anxiety    Asthma    CKD (chronic kidney disease)    stage IV   DDD (degenerative disc disease), cervical    Depression    Diabetes mellitus (HCC)     Dislocated shoulder    right   GERD (gastroesophageal reflux disease)    Headache    migraines (about once a month)   History of kidney stones    Hypertension    Neuropathy    Peripheral vascular disease (HCC)    Pneumonia    Stroke [facility])     Past Surgical History:  Procedure Laterality Date   ADENOIDECTOMY     APPENDECTOMY     CERVICAL SPINE SURGERY  06/2012   C5-C6   EYE SURGERY     left eye surgery    FOOT SURGERY     KENALOG INJECTION Left 03/10/2021   Procedure: Intravitreal Avastin Injection;  Surgeon: Stephannie Li, MD;  Location: [facility] OR;  Service: Ophthalmology;  Laterality: Left;   LOOP RECORDER INSERTION N/A 08/27/2017   Procedure: LOOP RECORDER INSERTION;  Surgeon: Thurmon Fair, MD;  Location: [facility] INVASIVE CV LAB;  Service: Cardiovascular;  Laterality: N/A;   MEMBRANE PEEL Left 03/10/2021   Procedure: MEMBRANE PEEL;  Surgeon: Stephannie Li, MD;  Location: [facility] OR;  Service: Ophthalmology;  Laterality: Left;   PARS PLANA VITRECTOMY Left 03/10/2021   Procedure: TWENTY-FIVE GAUGE PARS PLANA VITRECTOMY;  Surgeon: Linard Reno, MD;  Location: [facility] OR;  Service: Ophthalmology;  Laterality: Left;   PHOTOCOAGULATION WITH LASER Left 03/10/2021   Procedure: PHOTOCOAGULATION WITH LASER;  Surgeon: Linard Reno, MD;  Location: [facility] OR;  Service: Ophthalmology;  Laterality: Left;   SHOULDER SURGERY     TEE WITHOUT CARDIOVERSION N/A 08/27/2017   Procedure: TRANSESOPHAGEAL ECHOCARDIOGRAM (TEE);  Surgeon: Luana Rumple, MD;  Location: [facility] ENDOSCOPY;  Service: Cardiovascular;  Laterality: N/A;   TONSILLECTOMY      Family History: Family History  Problem Relation Age of Onset   Vascular Disease Mother    CAD Mother    Heart failure Mother    Heart disease Other    Cancer Other        colon, stomach, pancreatic, lung   Diabetes Other    Breast cancer Sister 30   Seizures Maternal Uncle     Social History  reports that she has never smoked. She has never used smokeless tobacco.  She reports that she does not drink alcohol and does not use drugs.  Allergies  Allergen Reactions   Lisinopril Swelling and Other (See Comments)    Facial swelling  angioedema   Penicillins Hives    Has patient had a PCN reaction causing immediate rash, facial/tongue/throat swelling, SOB or lightheadedness with hypotension: yes Has patient had a PCN reaction causing severe rash involving mucus membranes or skin necrosis: No Has patient had a PCN reaction that required hospitalization No Has patient had a PCN reaction occurring within the last 10 years: No If all of the above answers are "NO", then may proceed wit Rash on buttock that makes sitting very difficult   Quetiapine Other (See Comments)    Unknown    Gabapentin Swelling and Other (See Comments)    "I thought I was having another stroke." dizziness    Medications   Current Facility-Administered Medications:     stroke: early stages of recovery book, , Does not apply, Once, Kenny Peals, MD   acetaminophen (TYLENOL) tablet 650 mg, 650 mg, Oral, Q4H PRN **OR** acetaminophen (TYLENOL) 160 MG/5ML solution 650 mg, 650 mg, Per Tube, Q4H PRN **OR** acetaminophen (TYLENOL) suppository 650 mg, 650 mg, Rectal, Q4H PRN, Lydia Sams, Vishal R, MD   albuterol (PROVENTIL) (2.5 MG/3ML) 0.083% nebulizer solution 2.5 mg, 2.5 mg, Inhalation, Q6H PRN, Patel, Vishal R, MD   aspirin EC tablet 81 mg, 81 mg, Oral, BID, Patel, Vishal R, MD, 81 mg at 09/22/23 0142   atorvastatin (LIPITOR) tablet 80 mg, 80 mg, Oral, QHS, Patel, Vishal R, MD, 80 mg at 09/22/23 0142   FLUoxetine (PROZAC) capsule 20 mg, 20 mg, Oral, Daily, Patel, Vishal R, MD   heparin injection 5,000 Units, 5,000 Units, Subcutaneous, Q8H, Patel, Vishal R, MD   insulin aspart (novoLOG) injection 0-9 Units, 0-9 Units, Subcutaneous, TID WC, Patel, Vishal R, MD   melatonin tablet 5 mg, 5 mg, Oral, QHS PRN, Lydia Sams, Vishal R, MD   montelukast (SINGULAIR) tablet 10 mg, 10 mg, Oral, QHS, Patel,  Vishal R, MD, 10 mg at 09/22/23 0143   pantoprazole (PROTONIX) EC tablet 40 mg, 40 mg, Oral, BID, Patel, Vishal R, MD, 40 mg at 09/22/23 0143   polyethylene glycol (MIRALAX / GLYCOLAX) packet 17 g, 17 g, Oral, BID, Patel, Vishal R, MD   prochlorperazine (COMPAZINE) injection 10 mg, 10 mg, Intravenous, Q6H PRN, Kenny Peals, MD  senna-docusate (Senokot-S) tablet 1 tablet, 1 tablet, Oral, BID, Patel, Vishal R, MD   sodium bicarbonate tablet 650 mg, 650 mg, Oral, TID, Patel, Vishal R, MD   ticagrelor (BRILINTA) tablet 90 mg, 90 mg, Oral, BID, Patel, Vishal R, MD, 90 mg at 09/22/23 0143  Vitals   Vitals:   Oct 01, 2023 1745 2023-10-01 1748 10/01/23 2115 09/22/23 0208  BP: (!) 144/86  (!) 158/81 (!) 161/78  Pulse: 75  78 72  Resp: 19  17 18   Temp:  98.1 F (36.7 C)  98.2 F (36.8 C)  TempSrc:  Oral  Oral  SpO2: 100%  100%   Weight:      Height:        Body mass index is 23.58 kg/m.  Physical Exam   General: Laying comfortably in bed; in no acute distress.  HENT: Normal oropharynx and mucosa. Normal external appearance of ears and nose.  Neck: Supple, no pain or tenderness  CV: No JVD. No peripheral edema.  Pulmonary: Symmetric Chest rise. Normal respiratory effort.  Abdomen: Soft to touch, non-tender.  Ext: No cyanosis, edema, or deformity  Skin: No rash. Normal palpation of skin.   Musculoskeletal: Normal digits and nails by inspection. No clubbing.   Neurologic Examination  Mental status/Cognition: Alert, oriented to self, place, month and year, good attention.  Speech/language: Fluent, comprehension intact, object naming intact, repetition intact.  Cranial nerves:   CN II Pupils equal and reactive to light, no VF deficits    CN III,IV,VI EOM intact, no gaze preference or deviation, no nystagmus    CN V normal sensation in V1, V2, and V3 segments bilaterally    CN VII no asymmetry, no nasolabial fold flattening    CN VIII normal hearing to speech    CN IX & X normal palatal  elevation, no uvular deviation    CN XI 5/5 head turn and 5/5 shoulder shrug bilaterally    CN XII midline tongue protrusion    Motor:  Muscle bulk: normal, tone none, pronator drift none tremor none Mvmt Root Nerve  Muscle Right Left Comments  SA C5/6 Ax Deltoid 5 5   EF C5/6 Mc Biceps 5 5   EE C6/7/8 Rad Triceps 5 5   WF C6/7 Med FCR     WE C7/8 PIN ECU     F Ab C8/T1 U ADM/FDI 4 5   HF L1/2/3 Fem Illopsoas 4 5   KE L2/3/4 Fem Quad 5 5   DF L4/5 D Peron Tib Ant 5 5   PF S1/2 Tibial Grc/Sol 5 5    Sensation:  Light touch Shapr on the right   Pin prick    Temperature    Vibration   Proprioception    Coordination/Complex Motor:  - Finger to Nose intact BL - Heel to shin intact BL - Rapid alternating movement are slowed on the right - Gait: deferred.   Labs/Imaging/Neurodiagnostic studies   CBC:  Recent Labs  Lab 10/01/2023 1421  WBC 3.8*  NEUTROABS 1.8  HGB 8.8*  HCT 27.1*  MCV 90.9  PLT 202   Basic Metabolic Panel:  Lab Results  Component Value Date   NA 142 2023-10-01   K 4.3 10-01-2023   CO2 21 (L) October 01, 2023   GLUCOSE 97 2023-10-01   BUN 36 (H) 2023/10/01   CREATININE 2.84 (H) 10/01/23   CALCIUM 9.3 10/01/2023   GFRNONAA 20 (L) 10/01/2023   GFRAA 27 (L) 06/17/2020   Lipid Panel:  Lab Results  Component  Value Date   LDLCALC 43 04/14/2023   HgbA1c:  Lab Results  Component Value Date   HGBA1C 6.1 (H) 04/14/2023   Urine Drug Screen:     Component Value Date/Time   LABOPIA NONE DETECTED 10/27/2022 2357   COCAINSCRNUR NONE DETECTED 10/27/2022 2357   LABBENZ NONE DETECTED 10/27/2022 2357   AMPHETMU NONE DETECTED 10/27/2022 2357   THCU NONE DETECTED 10/27/2022 2357   LABBARB NONE DETECTED 10/27/2022 2357    Alcohol Level     Component Value Date/Time   ETH <10 10/27/2022 2158   INR  Lab Results  Component Value Date   INR 1.0 10/27/2022   APTT  Lab Results  Component Value Date   APTT 26 10/27/2022   AED levels: No results found  for: "PHENYTOIN", "ZONISAMIDE", "LAMOTRIGINE", "LEVETIRACETA"  CT Head without contrast(Personally reviewed): CTH was negative for a large hypodensity concerning for a large territory infarct or hyperdensity concerning for an ICH.  MR Angio head without contrast and Carotid Duplex BL(Personally reviewed): pending  MRI Brain(Personally reviewed): Left pontine stroke  ASSESSMENT   Ande Therrell is a 53 y.o. female with hx of diabetes, hypertension, peripheral vascular disease, degenerative disc disease, GERD, pres, CKD, TIA who presents with right-sided weakness.  Foun to have a small left pontine stroke. She is on aspirin and brilinta and compliant. Stroke appears small vessel in etiology.  RECOMMENDATIONS  - Frequent Neuro checks per stroke unit protocol - Recommend Vascular imaging with MRA Angio Head without contrast and US  Carotid doppler - Recommend obtaining TTE  - Recommend obtaining Lipid panel with LDL - Please start statin if LDL > 70 - Recommend HbA1c to evaluate for diabetes and how well it is controlled. - Antithrombotic - continue aspirin and brilinta - Recommend DVT ppx - SBP goal - permissive hypertension first 24 h < 220/110. Held home meds.  - Recommend Telemetry monitoring for arrythmia - Recommend bedside swallow screen prior to PO intake. - Stroke education booklet - Recommend PT/OT/SLP consult  ______________________________________________________________________    Signed, Lamount Bankson, MD [facility]

## 2023-09-22 NOTE — Evaluation (Signed)
 Occupational Therapy Evaluation Patient Details Name: Christina Orozco MRN: [id number] DOB: [birth year] Today's Date: 09/22/2023   History of Present Illness   Pt is 53 yo female admitted on 09/21/23 with R sided weakness found to have L pontine infarct.  Pt with hx including but not limited to DM, HTN, CKD, anemia, PVD, recurrent CVAs (pt reports one every other month in 2024)     Clinical Impressions PTA, pt lived alone and had PCA as needed. Upon eval, pt requires up to CGA for BADL with decr activity tolerance and standing tolerance noting RLE hyperextension with prolonged standing. Pt with decreased coordination and 3+/5 MMT RUE this session reporting weaker as compared to baseline. Recommending discharge home with HHOT and increased assist from PCA.      If plan is discharge home, recommend the following:   A little help with walking and/or transfers;A little help with bathing/dressing/bathroom;Assistance with cooking/housework;Assist for transportation;Help with stairs or ramp for entrance     Functional Status Assessment   Patient has had a recent decline in their functional status and demonstrates the ability to make significant improvements in function in a reasonable and predictable amount of time.     Equipment Recommendations   None recommended by OT     Recommendations for Other Services         Precautions/Restrictions   Precautions Precautions: Fall Restrictions Weight Bearing Restrictions Per Provider Order: No     Mobility Bed Mobility Overal bed mobility: Needs Assistance Bed Mobility: Supine to Sit, Sit to Supine     Supine to sit: Supervision Sit to supine: Supervision   General bed mobility comments: increased time    Transfers Overall transfer level: Needs assistance Equipment used: Rolling walker (2 wheels) Transfers: Sit to/from Stand Sit to Stand: Contact guard assist           General transfer comment: CGA for safety;  cues for hand placement      Balance Overall balance assessment: Needs assistance Sitting-balance support: No upper extremity supported Sitting balance-Leahy Scale: Good     Standing balance support: Bilateral upper extremity supported, Reliant on assistive device for balance Standing balance-Leahy Scale: Poor Standing balance comment: steady wtih RW                           ADL either performed or assessed with clinical judgement   ADL Overall ADL's : Needs assistance/impaired Eating/Feeding: Sitting;Modified independent   Grooming: Contact guard assist;Standing;Oral care Grooming Details (indicate cue type and reason): increased time. Able to use RUE for oral care today Upper Body Bathing: Set up;Sitting   Lower Body Bathing: Contact guard assist;Sit to/from stand   Upper Body Dressing : Set up;Sitting   Lower Body Dressing: Contact guard assist;Sit to/from stand   Toilet Transfer: Contact guard assist;Rolling walker (2 wheels);Ambulation           Functional mobility during ADLs: Contact guard assist;Rolling walker (2 wheels)       Vision Baseline Vision/History: 1 Wears glasses;5 Retinopathy Ability to See in Adequate Light: 1 Impaired Patient Visual Report: No change from baseline Vision Assessment?: No apparent visual deficits Additional Comments: pt reports no new changes; uses compensatory techniques for ADL at baseline for decr vision such as purchasing colored toothpaste to incr contrast. Rencently got her glasses updated     Perception Perception: Not tested       Praxis Praxis: Not tested       Pertinent Vitals/Pain Pain  Assessment Pain Assessment: Faces Faces Pain Scale: Hurts a little bit Pain Location: RLE Pain Descriptors / Indicators: Discomfort Pain Intervention(s): Limited activity within patient's tolerance, Monitored during session     Extremity/Trunk Assessment Upper Extremity Assessment Upper Extremity Assessment:  Generalized weakness;Right hand dominant;RUE deficits/detail RUE Deficits / Details: RUE decreased coordination and dexterity; grossly 3+/5. ROM WFL   Lower Extremity Assessment Lower Extremity Assessment: Defer to PT evaluation RLE Deficits / Details: ROM: WFL; MMT: ankle DF 5/5, knee ext 5/5, hip flex 5/5; however, with gait pt with obvious strength deficits particularly with fatigue indicated by decreased DF at times, steppage gait, and R knee hyperextending RLE Sensation: WNL LLE Deficits / Details: ROM WFL; MMT 5/5 LLE Sensation: WNL   Cervical / Trunk Assessment Cervical / Trunk Assessment: Normal   Communication Communication Communication: No apparent difficulties   Cognition Arousal: Alert Behavior During Therapy: WFL for tasks assessed/performed Cognition: No family/caregiver present to determine baseline             OT - Cognition Comments: pt reports she receives medications in bubble packs pre-separated and labeled by time to be taken                 Following commands: Intact (1-2 step basic commands.)       Cueing  General Comments   Cueing Techniques: Verbal cues  VSS   Exercises     Shoulder Instructions      Home Living Family/patient expects to be discharged to:: Private residence Living Arrangements: Alone (daughter will be home from college in summer) Available Help at Discharge: Family;Available PRN/intermittently;Personal care attendant Type of Home: Apartment Home Access: Level entry     Home Layout: One level     Bathroom Shower/Tub: Chief Strategy Officer: Standard     Home Equipment: Tub bench;Grab bars - tub/shower;Rollator (4 wheels);Rolling Walker (2 wheels);Cane - single point;BSC/3in1   Additional Comments: CPAP at night; Does have PCA 3 x week for 1-2 hour that mainly assist with IADLs or ADls if needed      Prior Functioning/Environment Prior Level of Function : Needs assist             Mobility  Comments: Ambulates with rollator - limited community ambulatory; denies recent falls ADLs Comments: Pt mostly does ADLs on her own (PCA assist at times); PCA assist with IADLs; uses [facility] or calls DSS for transportation to MD appointments    OT Problem List: Decreased strength;Impaired balance (sitting and/or standing);Decreased activity tolerance;Decreased cognition;Impaired UE functional use   OT Treatment/Interventions: Self-care/ADL training;Therapeutic exercise;DME and/or AE instruction;Balance training;Patient/family education;Therapeutic activities      OT Goals(Current goals can be found in the care plan section)   Acute Rehab OT Goals Patient Stated Goal: go home OT Goal Formulation: With patient Time For Goal Achievement: 10/06/23 Potential to Achieve Goals: Good   OT Frequency:  Min 2X/week    Co-evaluation              AM-PAC OT "6 Clicks" Daily Activity     Outcome Measure Help from another person eating meals?: None Help from another person taking care of personal grooming?: A Little Help from another person toileting, which includes using toliet, bedpan, or urinal?: A Little Help from another person bathing (including washing, rinsing, drying)?: A Little Help from another person to put on and taking off regular upper body clothing?: A Little Help from another person to put on and taking off regular lower body clothing?:  A Little 6 Click Score: 19   End of Session Equipment Utilized During Treatment: Gait belt;Rolling walker (2 wheels) Nurse Communication: Mobility status  Activity Tolerance: Patient tolerated treatment well Patient left: in bed;with call bell/phone within reach;with bed alarm set  OT Visit Diagnosis: Unsteadiness on feet (R26.81);Muscle weakness (generalized) (M62.81);Low vision, both eyes (H54.2);Pain Pain - Right/Left: Right Pain - part of body: Knee                Time: 1445-1501 OT Time Calculation (min): 16 min Charges:  OT  General Charges $OT Visit: 1 Visit OT Evaluation $OT Eval Low Complexity: 1 Low  Karilyn Ouch, OTR/L [facility] Acute Rehabilitation Office: [phone]   Emery Hans 09/22/2023, 3:19 PM

## 2023-09-22 NOTE — Hospital Course (Signed)
 Christina Orozco is a 53 y.o. female with medical history significant for recurrent CVAs with residual right-sided weakness and left visual field deficits, CKD stage IV, T2DM, HTN, HLD, asthma, anemia of chronic disease, depression/anxiety, and OSA on CPAP who is admitted with acute left pontine stroke.

## 2023-09-22 NOTE — Progress Notes (Signed)
 Discussed with neurology, Dr. Consuelo Denmark, patient will likely need cerebral angiogram but has CKD stage IV. Hey Dr. Dolan Freiberg respiratory pharmacist please hey I called and confirmed with nephrology, Dr. Ansel Kingdom.  She recommends giving IV fluids 3 amps of sodium bicarb plus D5W at 50 mL/h, 12-hour prior to procedure and 50 mL/h 12-hour postprocedure. Might lead to slight rise in creatinine They will follow patient as outpatient.

## 2023-09-22 NOTE — Progress Notes (Signed)
 Echocardiogram 2D Echocardiogram has been performed.  Christina Orozco 09/22/2023, 4:03 PM

## 2023-09-22 NOTE — Plan of Care (Signed)

## 2023-09-23 ENCOUNTER — Observation Stay (HOSPITAL_COMMUNITY)

## 2023-09-23 DIAGNOSIS — J4520 Mild intermittent asthma, uncomplicated: Secondary | ICD-10-CM | POA: Diagnosis present

## 2023-09-23 DIAGNOSIS — E1122 Type 2 diabetes mellitus with diabetic chronic kidney disease: Secondary | ICD-10-CM | POA: Diagnosis present

## 2023-09-23 DIAGNOSIS — D631 Anemia in chronic kidney disease: Secondary | ICD-10-CM | POA: Diagnosis present

## 2023-09-23 DIAGNOSIS — Z8249 Family history of ischemic heart disease and other diseases of the circulatory system: Secondary | ICD-10-CM | POA: Diagnosis not present

## 2023-09-23 DIAGNOSIS — I639 Cerebral infarction, unspecified: Secondary | ICD-10-CM

## 2023-09-23 DIAGNOSIS — Z888 Allergy status to other drugs, medicaments and biological substances status: Secondary | ICD-10-CM | POA: Diagnosis not present

## 2023-09-23 DIAGNOSIS — I6329 Cerebral infarction due to unspecified occlusion or stenosis of other precerebral arteries: Secondary | ICD-10-CM | POA: Diagnosis present

## 2023-09-23 DIAGNOSIS — I69351 Hemiplegia and hemiparesis following cerebral infarction affecting right dominant side: Secondary | ICD-10-CM | POA: Diagnosis not present

## 2023-09-23 DIAGNOSIS — Z803 Family history of malignant neoplasm of breast: Secondary | ICD-10-CM | POA: Diagnosis not present

## 2023-09-23 DIAGNOSIS — I10 Essential (primary) hypertension: Secondary | ICD-10-CM | POA: Diagnosis not present

## 2023-09-23 DIAGNOSIS — I152 Hypertension secondary to endocrine disorders: Secondary | ICD-10-CM | POA: Diagnosis present

## 2023-09-23 DIAGNOSIS — G4733 Obstructive sleep apnea (adult) (pediatric): Secondary | ICD-10-CM | POA: Diagnosis not present

## 2023-09-23 DIAGNOSIS — E1159 Type 2 diabetes mellitus with other circulatory complications: Secondary | ICD-10-CM | POA: Diagnosis present

## 2023-09-23 DIAGNOSIS — E785 Hyperlipidemia, unspecified: Secondary | ICD-10-CM | POA: Diagnosis present

## 2023-09-23 DIAGNOSIS — E1151 Type 2 diabetes mellitus with diabetic peripheral angiopathy without gangrene: Secondary | ICD-10-CM | POA: Diagnosis present

## 2023-09-23 DIAGNOSIS — Z79899 Other long term (current) drug therapy: Secondary | ICD-10-CM | POA: Diagnosis not present

## 2023-09-23 DIAGNOSIS — Y844 Aspiration of fluid as the cause of abnormal reaction of the patient, or of later complication, without mention of misadventure at the time of the procedure: Secondary | ICD-10-CM | POA: Diagnosis not present

## 2023-09-23 DIAGNOSIS — N179 Acute kidney failure, unspecified: Secondary | ICD-10-CM | POA: Diagnosis present

## 2023-09-23 DIAGNOSIS — D638 Anemia in other chronic diseases classified elsewhere: Secondary | ICD-10-CM | POA: Diagnosis not present

## 2023-09-23 DIAGNOSIS — N184 Chronic kidney disease, stage 4 (severe): Secondary | ICD-10-CM | POA: Diagnosis present

## 2023-09-23 DIAGNOSIS — Z833 Family history of diabetes mellitus: Secondary | ICD-10-CM | POA: Diagnosis not present

## 2023-09-23 DIAGNOSIS — K5900 Constipation, unspecified: Secondary | ICD-10-CM | POA: Diagnosis present

## 2023-09-23 DIAGNOSIS — D72819 Decreased white blood cell count, unspecified: Secondary | ICD-10-CM | POA: Diagnosis present

## 2023-09-23 DIAGNOSIS — I6389 Other cerebral infarction: Secondary | ICD-10-CM | POA: Diagnosis not present

## 2023-09-23 DIAGNOSIS — I129 Hypertensive chronic kidney disease with stage 1 through stage 4 chronic kidney disease, or unspecified chronic kidney disease: Secondary | ICD-10-CM | POA: Diagnosis not present

## 2023-09-23 DIAGNOSIS — E11319 Type 2 diabetes mellitus with unspecified diabetic retinopathy without macular edema: Secondary | ICD-10-CM | POA: Diagnosis present

## 2023-09-23 DIAGNOSIS — Z880 Allergy status to penicillin: Secondary | ICD-10-CM | POA: Diagnosis not present

## 2023-09-23 DIAGNOSIS — F329 Major depressive disorder, single episode, unspecified: Secondary | ICD-10-CM | POA: Diagnosis present

## 2023-09-23 DIAGNOSIS — Z87442 Personal history of urinary calculi: Secondary | ICD-10-CM | POA: Diagnosis not present

## 2023-09-23 DIAGNOSIS — E1140 Type 2 diabetes mellitus with diabetic neuropathy, unspecified: Secondary | ICD-10-CM | POA: Diagnosis present

## 2023-09-23 DIAGNOSIS — E1169 Type 2 diabetes mellitus with other specified complication: Secondary | ICD-10-CM | POA: Diagnosis present

## 2023-09-23 DIAGNOSIS — Z7985 Long-term (current) use of injectable non-insulin antidiabetic drugs: Secondary | ICD-10-CM

## 2023-09-23 LAB — LUPUS ANTICOAGULANT PANEL
DRVVT: 40.3 s (ref 0.0–47.0)
PTT Lupus Anticoagulant: 31.5 s (ref 0.0–43.5)

## 2023-09-23 LAB — BASIC METABOLIC PANEL WITH GFR
Anion gap: 10 (ref 5–15)
BUN: 30 mg/dL — ABNORMAL HIGH (ref 6–20)
CO2: 21 mmol/L — ABNORMAL LOW (ref 22–32)
Calcium: 9.6 mg/dL (ref 8.9–10.3)
Chloride: 112 mmol/L — ABNORMAL HIGH (ref 98–111)
Creatinine, Ser: 2.89 mg/dL — ABNORMAL HIGH (ref 0.44–1.00)
GFR, Estimated: 19 mL/min — ABNORMAL LOW (ref >60)
Glucose, Bld: 101 mg/dL — ABNORMAL HIGH (ref 70–99)
Potassium: 4.3 mmol/L (ref 3.5–5.1)
Sodium: 143 mmol/L (ref 135–145)

## 2023-09-23 LAB — GLUCOSE, CAPILLARY
Glucose-Capillary: 87 mg/dL (ref 70–99)
Glucose-Capillary: 104 mg/dL — ABNORMAL HIGH (ref 70–99)
Glucose-Capillary: 111 mg/dL — ABNORMAL HIGH (ref 70–99)
Glucose-Capillary: 177 mg/dL — ABNORMAL HIGH (ref 70–99)

## 2023-09-23 LAB — ANTI-DNA ANTIBODY, DOUBLE-STRANDED: ds DNA Ab: <1 [IU]/mL (ref 0–9)

## 2023-09-23 LAB — PROTEIN C ACTIVITY: Protein C Activity: 117 % (ref 73–180)

## 2023-09-23 LAB — PROTEIN S, TOTAL: Protein S Ag, Total: 64 % (ref 60–150)

## 2023-09-23 LAB — PROTEIN S ACTIVITY: Protein S Activity: 49 % — ABNORMAL LOW (ref 63–140)

## 2023-09-23 LAB — ANA W/REFLEX IF POSITIVE: Anti Nuclear Antibody (ANA): NEGATIVE

## 2023-09-23 LAB — SJOGRENS SYNDROME-B EXTRACTABLE NUCLEAR ANTIBODY: SSB (La) (ENA) Antibody, IgG: <0.2 AI (ref 0.0–0.9)

## 2023-09-23 LAB — SJOGRENS SYNDROME-A EXTRACTABLE NUCLEAR ANTIBODY: SSA (Ro) (ENA) Antibody, IgG: <0.2 AI (ref 0.0–0.9)

## 2023-09-23 MED ORDER — SODIUM BICARBONATE 8.4 % IV SOLN
INTRAVENOUS | Status: AC
  Filled 2023-09-23 (×2): qty 1000

## 2023-09-23 MED ORDER — SODIUM BICARBONATE 8.4 % IV SOLN: INTRAVENOUS | Status: DC

## 2023-09-23 NOTE — Progress Notes (Signed)
 STROKE TEAM PROGRESS NOTE   SUBJECTIVE (INTERVAL HISTORY) No family is at the bedside. Pt lying in bed, neuro stable, unchanged. She is AAO x 3. Dr. Laverta Potters has talked with her about possible angiogram in am. Major concern is her renal function but will proceed with hydration protocol. Brilinta is on hold in preparation of possible LP.    OBJECTIVE Temp:  [97.7 F (36.5 C)-98.7 F (37.1 C)] 97.7 F (36.5 C) (04/13 1157) Pulse Rate:  [73-80] 79 (04/13 1157) Cardiac Rhythm: Normal sinus rhythm (04/13 0700) Resp:  [12-20] 19 (04/13 1157) BP: (153-184)/(77-95) 179/83 (04/13 1157) SpO2:  [100 %] 100 % (04/13 1157)  Recent Labs  Lab 09/22/23 1231 09/22/23 1628 09/22/23 2151 09/23/23 0610 09/23/23 1157  GLUCAP 90 127* 107* 87 104*   Recent Labs  Lab 09/21/23 1421 09/22/23 0815 09/23/23 1150  NA 142 144 143  K 4.3 3.8 4.3  CL 109 112* 112*  CO2 21* 22 21*  GLUCOSE 97 102* 101*  BUN 36* 34* 30*  CREATININE 2.84* 2.94* 2.89*  CALCIUM 9.3 9.3 9.6   Recent Labs  Lab 09/21/23 1421  AST 33  ALT 35  ALKPHOS 43  BILITOT 0.7  PROT 7.3  ALBUMIN 4.0   Recent Labs  Lab 09/21/23 1421 09/22/23 0815  WBC 3.8* 3.9*  NEUTROABS 1.8  --   HGB 8.8* 8.1*  HCT 27.1* 24.5*  MCV 90.9 90.7  PLT 202 201   No results for input(s): "CKTOTAL", "CKMB", "CKMBINDEX", "TROPONINI" in the last 168 hours. No results for input(s): "LABPROT", "INR" in the last 72 hours. Recent Labs    09/21/23 1725  COLORURINE YELLOW  LABSPEC 1.010  PHURINE 5.0  GLUCOSEU NEGATIVE  HGBUR NEGATIVE  BILIRUBINUR NEGATIVE  KETONESUR NEGATIVE  PROTEINUR 30*  NITRITE NEGATIVE  LEUKOCYTESUR LARGE*       Component Value Date/Time   CHOL 128 09/22/2023 0815   CHOL 135 06/17/2020 1053   TRIG 59 09/22/2023 0815   HDL 72 09/22/2023 0815   HDL 47 06/17/2020 1053   CHOLHDL 1.8 09/22/2023 0815   VLDL 12 09/22/2023 0815   LDLCALC 44 09/22/2023 0815   LDLCALC 63 06/17/2020 1053   Lab Results  Component Value  Date   HGBA1C 5.1 09/22/2023      Component Value Date/Time   LABOPIA NONE DETECTED 09/22/2023 1848   COCAINSCRNUR NONE DETECTED 09/22/2023 1848   LABBENZ NONE DETECTED 09/22/2023 1848   AMPHETMU NONE DETECTED 09/22/2023 1848   THCU NONE DETECTED 09/22/2023 1848   LABBARB NONE DETECTED 09/22/2023 1848    No results for input(s): "ETH" in the last 168 hours.  I have personally reviewed the radiological images below and agree with the radiology interpretations.  ECHOCARDIOGRAM COMPLETE Result Date: 09/22/2023    ECHOCARDIOGRAM REPORT   Patient Name:   Christina Orozco Date of Exam: 09/22/2023 Medical Rec #:  [id number]        Height:       62.5 in Accession #:    [id number]       Weight:       131.0 lb Date of Birth:  January 21, 1971       BSA:          1.607 m Patient Age:    52 years         BP:           154/78 mmHg Patient Gender: F                HR:  74 bpm. Exam Location:  Inpatient Procedure: 2D Echo, Cardiac Doppler and Color Doppler (Both Spectral and Color            Flow Doppler were utilized during procedure). Indications:    Stroke I63.9  History:        Patient has prior history of Echocardiogram examinations, most                 recent 08/23/2022. Stroke, TIA and CKD, stage 4,                 Signs/Symptoms:Shortness of Breath; Risk Factors:Hypertension,                 Diabetes and Sleep Apnea.  Sonographer:    Terrilee Few RCS Referring Phys: [id number] VISHAL R PATEL IMPRESSIONS  1. Left ventricular ejection fraction, by estimation, is 55 to 60%. The left ventricle has normal function. The left ventricle has no regional wall motion abnormalities. There is mild concentric left ventricular hypertrophy. Left ventricular diastolic parameters were normal.  2. Right ventricular systolic function is normal. The right ventricular size is normal. There is normal pulmonary artery systolic pressure. The estimated right ventricular systolic pressure is 19.0 mmHg.  3. A small pericardial  effusion is present. The pericardial effusion is circumferential. There is no evidence of cardiac tamponade.  4. The mitral valve is normal in structure. Trivial mitral valve regurgitation. No evidence of mitral stenosis.  5. The aortic valve is tricuspid. Aortic valve regurgitation is not visualized. No aortic stenosis is present.  6. The inferior vena cava is normal in size with greater than 50% respiratory variability, suggesting right atrial pressure of 3 mmHg. FINDINGS  Left Ventricle: Left ventricular ejection fraction, by estimation, is 55 to 60%. The left ventricle has normal function. The left ventricle has no regional wall motion abnormalities. The left ventricular internal cavity size was normal in size. There is  mild concentric left ventricular hypertrophy. Left ventricular diastolic parameters were normal. Right Ventricle: The right ventricular size is normal. No increase in right ventricular wall thickness. Right ventricular systolic function is normal. There is normal pulmonary artery systolic pressure. The tricuspid regurgitant velocity is 2.00 m/s, and  with an assumed right atrial pressure of 3 mmHg, the estimated right ventricular systolic pressure is 19.0 mmHg. Left Atrium: Left atrial size was normal in size. Right Atrium: Right atrial size was normal in size. Pericardium: A small pericardial effusion is present. The pericardial effusion is circumferential. There is no evidence of cardiac tamponade. Mitral Valve: The mitral valve is normal in structure. Trivial mitral valve regurgitation. No evidence of mitral valve stenosis. Tricuspid Valve: The tricuspid valve is normal in structure. Tricuspid valve regurgitation is trivial. Aortic Valve: The aortic valve is tricuspid. Aortic valve regurgitation is not visualized. No aortic stenosis is present. Aortic valve peak gradient measures 6.5 mmHg. Pulmonic Valve: The pulmonic valve was grossly normal. Pulmonic valve regurgitation is not visualized.  No evidence of pulmonic stenosis. Aorta: The aortic root and ascending aorta are structurally normal, with no evidence of dilitation. Venous: The inferior vena cava is normal in size with greater than 50% respiratory variability, suggesting right atrial pressure of 3 mmHg. IAS/Shunts: No atrial level shunt detected by color flow Doppler.  LEFT VENTRICLE PLAX 2D LVIDd:         4.30 cm   Diastology LVIDs:         2.50 cm   LV e' medial:    8.49 cm/s LV PW:  1.40 cm   LV E/e' medial:  9.2 LV IVS:        1.30 cm   LV e' lateral:   12.80 cm/s LVOT diam:     2.10 cm   LV E/e' lateral: 6.1 LV SV:         57 LV SV Index:   36 LVOT Area:     3.46 cm  RIGHT VENTRICLE             IVC RV S prime:     12.30 cm/s  IVC diam: 1.90 cm TAPSE (M-mode): 1.9 cm LEFT ATRIUM             Index        RIGHT ATRIUM           Index LA diam:        3.00 cm 1.87 cm/m   RA Area:     10.00 cm LA Vol (A2C):   32.1 ml 19.98 ml/m  RA Volume:   19.70 ml  12.26 ml/m LA Vol (A4C):   39.8 ml 24.77 ml/m LA Biplane Vol: 38.5 ml 23.96 ml/m  AORTIC VALVE AV Area (Vmax): 2.30 cm AV Vmax:        127.00 cm/s AV Peak Grad:   6.5 mmHg LVOT Vmax:      84.50 cm/s LVOT Vmean:     55.300 cm/s LVOT VTI:       0.166 m  AORTA Ao Root diam: 3.00 cm Ao Asc diam:  2.80 cm MITRAL VALVE               TRICUSPID VALVE MV Area (PHT): 4.31 cm    TR Peak grad:   16.0 mmHg MV Decel Time: 176 msec    TR Vmax:        200.00 cm/s MV E velocity: 77.70 cm/s MV A velocity: 91.60 cm/s  SHUNTS MV E/A ratio:  0.85        Systemic VTI:  0.17 m                            Systemic Diam: 2.10 cm Karyl Paget Croitoru MD Electronically signed by Luana Rumple MD Signature Date/Time: 09/22/2023/4:53:23 PM    Final    MR ANGIO HEAD WO CONTRAST Result Date: 09/22/2023 CLINICAL DATA:  Follow-up examination for stroke. EXAM: MRA HEAD WITHOUT CONTRAST TECHNIQUE: Angiographic images of the Circle of Willis were acquired using MRA technique without intravenous contrast. COMPARISON:  Prior  MRI from 09/21/2023. FINDINGS: Anterior circulation: Both internal carotid arteries widely patent to the termini without stenosis. A1 segments widely patent. Normal anterior communicating artery complex. Both anterior cerebral arteries widely patent to their distal aspects without stenosis. No M1 stenosis or occlusion. Normal MCA bifurcations. Distal MCA branches well perfused and symmetric. Posterior circulation: Visualized distal V4 segments widely patent. Left vertebral artery dominant. Left PICA patent. Right PICA not seen. Basilar patent without stenosis. Superior cerebellar and posterior cerebral arteries patent bilaterally. Anatomic variants: As above. Other: No intracranial aneurysm. Small lipoma noted at the right frontal scalp. IMPRESSION: Normal intracranial MRA. Electronically Signed   By: Virgia Griffins M.D.   On: 09/22/2023 02:55   MR Cervical Spine Wo Contrast Result Date: 09/21/2023 CLINICAL DATA:  Initial evaluation for acute neck pain. EXAM: MRI CERVICAL SPINE WITHOUT CONTRAST TECHNIQUE: Multiplanar, multisequence MR imaging of the cervical spine was performed. No intravenous contrast was administered. COMPARISON:  And MRI from 07/05/2015. FINDINGS: Alignment:  Straightening with mild smooth reversal of the normal cervical lordosis. No significant listhesis. Vertebrae: Prior ACDF at C5-6 with solid arthrodesis. Vertebral body height maintained without acute or chronic fracture. Atypical hemangioma noted within the T1 vertebral body. No other worrisome osseous lesions. Degenerative reactive endplate change with marrow edema present about the C3-4 interspace. No other abnormal marrow edema. Cord: Normal signal and morphology. Posterior Fossa, vertebral arteries, paraspinal tissues: Changes of severe chronic microvascular ischemic disease noted within the pons. Craniocervical junction within normal limits. Paraspinous soft tissues within normal limits. Normal flow voids seen within the  vertebral arteries bilaterally. Disc levels: C2-C3: Unremarkable. C3-C4: Degenerative intervertebral disc space narrowing with diffuse disc osteophyte complex. Broad posterior component flattens and effaces the ventral thecal sac. Mild cord flattening without cord signal changes. Mild to moderate spinal stenosis. Foramina remain patent. C4-C5: Small central disc protrusion indents the ventral thecal sac. No significant spinal stenosis. Mild uncovertebral spurring without significant foraminal encroachment. C5-C6: Prior fusion. No residual spinal stenosis. Foramina appear patent. C6-C7: Mild disc bulge with uncovertebral spurring. No spinal stenosis. Foramina remain patent. C7-T1: Normal interspace. Minimal right-sided facet hypertrophy. No canal or foraminal stenosis. IMPRESSION: 1. Degenerative disc osteophyte complex at C3-4 with resultant mild to moderate spinal stenosis. Degenerative reactive endplate change with marrow edema at this level could contribute to neck pain. 2. Prior ACDF at C5-6 without residual or recurrent stenosis. 3. Small central disc protrusion at C4-5 without significant stenosis. Electronically Signed   By: Virgia Griffins M.D.   On: 09/21/2023 21:57   MR BRAIN WO CONTRAST Result Date: 09/21/2023 CLINICAL DATA:  Initial evaluation for acute neuro deficit, right-sided symptoms. EXAM: MRI HEAD WITHOUT CONTRAST TECHNIQUE: Multiplanar, multiecho pulse sequences of the brain and surrounding structures were obtained without intravenous contrast. COMPARISON:  Comparison made with prior CT from earlier the same day as well as previous MRI from 04/14/2023 FINDINGS: Brain: Generalized cerebral atrophy, most pronounced about the brainstem, advanced for age. Extensive T2/FLAIR hyperintensity involving the periventricular deep white matter both cerebral hemispheres, with patchy involvement of the deep gray nuclei, brainstem, and cerebellum, most likely related to advanced chronic microvascular  ischemic disease. Multiple remote lacunar infarcts present about the bilateral basal ganglia, thalami, and pons. 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 No associated mass effect or convincing associated hemorrhage, although evaluation limited as there is multiple chronic micro hemorrhages at this location. No other evidence for acute or subacute ischemia. Gray-white matter differentiation otherwise maintained. No acute intracranial hemorrhage. Multiple chronic micro hemorrhages noted, most pronounced about the pons and thalami, most characteristic of chronic poorly controlled hypertension. No mass lesion, midline shift or mass effect. No hydrocephalus or extra-axial fluid collection. Pituitary gland within normal limits. Vascular: Major intracranial vascular flow voids are maintained. Skull and upper cervical spine: Craniocervical junction within normal limits. Bone marrow signal intensity normal. No scalp soft tissue abnormality. Sinuses/Orbits: Prior bilateral ocular lens replacement. Paranasal sinuses are clear. No significant mastoid effusion. Other: None. IMPRESSION: 1. 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 No associated mass effect or hemorrhage. 2. Underlying advanced chronic microvascular ischemic disease, with multiple remote lacunar infarcts about the bilateral basal ganglia, thalami, and pons. 3. Multiple chronic micro hemorrhages, most pronounced about the pons and thalami, most characteristic of chronic poorly controlled hypertension. Electronically Signed   By: Virgia Griffins M.D.   On: 09/21/2023 21:51   CT Head Wo Contrast Result Date: 09/21/2023 CLINICAL DATA:  Right-sided weakness worse than baseline since waking  up at 8 a.m. today. EXAM: CT HEAD WITHOUT CONTRAST TECHNIQUE: Contiguous axial images were obtained from the base of the skull through the vertex without intravenous contrast. RADIATION DOSE REDUCTION: This exam was performed according to the  departmental dose-optimization program which includes automated exposure control, adjustment of the mA and/or kV according to patient size and/or use of iterative reconstruction technique. COMPARISON:  CT head without contrast 04/14/2023. MRI head 05/28/2023. FINDINGS: Brain: Multiple remote lacunar infarcts are again noted in the thalami bilaterally. Extensive periventricular white matter changes are present bilaterally. No acute infarct, hemorrhage, or mass lesion is present. Moderate confluent white matter hypoattenuation is stable. Basal ganglia are otherwise within normal limits. The insular ribbon is normal bilaterally. No acute or focal cortical abnormality is present. Extensive white matter changes are present the brainstem and right greater than left cerebellar peduncles, similar the prior exam. No acute infarct or hemorrhage is present within the posterior fossa. Midline structures are within normal limits. Vascular: Atherosclerotic calcifications are present within the cavernous internal carotid arteries bilaterally. No hyperdense vessel is present. Skull: Calvarium is intact. No focal lytic or blastic lesions are present. No significant extracranial soft tissue lesion is present. Sinuses/Orbits: The paranasal sinuses and mastoid air cells are clear. Bilateral lens replacements are noted. Globes and orbits are otherwise unremarkable. IMPRESSION: 1. No acute intracranial abnormality or significant interval change. 2. Multiple remote lacunar infarcts in the thalami bilaterally. 3. Stable extensive periventricular white matter disease bilaterally is stable. This likely reflects the sequela of chronic microvascular ischemia. Electronically Signed   By: Audree Leas M.D.   On: 09/21/2023 18:18   DG Abdomen 1 View Result Date: 09/21/2023 CLINICAL DATA:  Constipation EXAM: ABDOMEN - 1 VIEW COMPARISON:  None Available. FINDINGS: A moderate volume of stool material is present in the right colon. A  moderate to large volume of well-formed stool material is favored to be in the left colon and rectum. IMPRESSION: 1. Imaging features suggestive of constipation. Electronically Signed   By: Reagan Camera M.D.   On: 09/21/2023 16:08   CT VIRTUAL COLONOSCOPY DIAGNOSTIC Result Date: 09/08/2023 CLINICAL DATA:  Occult blood positive stool EXAM: CT VIRTUAL COLONOSCOPY DIAGNOSTIC TECHNIQUE: The patient was given a standard bowel preparation with Gastrografin and barium for fluid and stool tagging respectively. The quality of the bowel preparation is fair. Automated CO2 insufflation of the colon was performed prior to image acquisition and colonic distention is good. Image post processing was used to generate a 3D endoluminal fly-through projection of the colon and to electronically subtract stool/fluid as appropriate. COMPARISON:  11/21/2021 FINDINGS: VIRTUAL COLONOSCOPY Moderate amount of retained barium within the colon. Tortuous transverse colon and sigmoid colon. No fixed polypoid filling defects or annular constricting lesions. Virtual colonoscopy is not designed to detect diminutive polyps (i.e., less than or equal to 5 mm), the presence or absence of which may not affect clinical management. CT ABDOMEN AND PELVIS WITHOUT CONTRAST Lower chest: No acute findings Hepatobiliary: No focal hepatic abnormality. Gallbladder unremarkable. Pancreas: No focal abnormality or ductal dilatation. Spleen: No focal abnormality.  Normal size. Adrenals/Urinary Tract: Stable 6 mm stone in the lower pole of the right kidney. No ureteral stones or hydronephrosis. Adrenal glands and urinary bladder unremarkable. Stomach/Bowel: Stomach and small bowel decompressed unremarkable. Vascular/Lymphatic: Aortic atherosclerosis. No evidence of aneurysm or adenopathy. Reproductive: Uterus and adnexa unremarkable.  No mass. Other: No free fluid or free air. Musculoskeletal: No acute bony abnormality. IMPRESSION: Tortuous colon. No fixed polypoid  filling defects or  annular constricting lesions. Aortic atherosclerosis. Right nephrolithiasis.  No hydronephrosis. No acute findings. Electronically Signed   By: Janeece Mechanic M.D.   On: 09/08/2023 00:16     PHYSICAL EXAM  Temp:  [97.7 F (36.5 C)-98.7 F (37.1 C)] 97.7 F (36.5 C) (04/13 1157) Pulse Rate:  [73-80] 79 (04/13 1157) Resp:  [12-20] 19 (04/13 1157) BP: (153-184)/(77-95) 179/83 (04/13 1157) SpO2:  [100 %] 100 % (04/13 1157)  General - Well nourished, well developed, in no apparent distress, on nasal pillow use for OSA.  Ophthalmologic - fundi not visualized due to noncooperation.  Cardiovascular - Regular rhythm and rate.  Neuro - awake, alert, eyes open, orientated to age, place, time and people. No aphasia, fluent language, following all simple commands. Able to name and repeat. No gaze palsy, tracking bilaterally, visual field full, PERRL. No facial droop. Tongue midline. Bilateral UEs 4/5, no drift. Bilaterally LEs 3/5, no drift. Sensation symmetrical bilaterally, b/l FTN and HTS ataxia, gait not tested.    ASSESSMENT/PLAN [name] is a 53 y.o. female with history of recurrent stroke, hypertension, hyperlipidemia, diabetes, PVD, CKD 4, seizure, OSA on CPAP admitted for right-sided weakness. No TNK given due to outside the window.    Stroke:  left pontine small infarct, etiology not quite clear now given frequently recurrent stroke CT no acute finding MRI small left pontine infarct MRA unremarkable Carotid Doppler pending TCD bubble study pending 2D Echo EF 55-60% Plan for cerebral angiogram to rule out CNS vasculitis Plan for LP to rule out CNS vasculitis LDL 44 HgbA1c 5.1 Hypercoagulable workup pending Autoimmune workup pending UDS neg Heparin subcu for VTE prophylaxis aspirin 81 mg daily and Brilinta (ticagrelor) 90 mg bid prior to admission, now on aspirin 81 mg daily in preparation for LP.  Patient counseled to be compliant with her  antithrombotic medications Ongoing aggressive stroke risk factor management Therapy recommendations: Home health PT and OT Disposition: Pending  History of stroke 08/2015 CSF neg, protein 58 07/2017 MRI showed left pontine infarct, EF 65 to 70%, carotid Doppler and MRA negative.  LDL 43, A1c 12.5, patient put on DAPT and Lipitor 40. 08/2017 TEE showed no PFO. Loop recorder placed, no A. fib found 02/27/2019 woke up with complete vision loss, gradually vision resumed OD, still has better vision OS.  MRI at that time showed 3 mm acute to early subacute infarct in the dorsal midline of the pons negative MRA head and neck. 07/2019 loop recorder explanted 05/01/2020 MRI showed new T2 hyperintense lesion of the right frontal white matter likely reflect an area of late subacute or chronic infarction. 06/19/2020 Subcentimeter likely subacute infarct at the left lateral aspect of the pons.  MRA head unremarkable, carotid Doppler unremarkable, EF 70 to 75%.  LDL 63, A1c 6.2.  Put on aspirin and Brilinta for 1 months and then back to aspirin and the Plavix.  Continue Lipitor 80. 11/10/2021 - MRI showed possible small right cerebral peduncle infarcts.  MRA negative.  Carotid Doppler unremarkable.  EF 60 to 65%, LDL 34, A1c 6.5, discharged with ASA and brilinta 90 twice daily.  Outpatient follow-up with [facility], continued on aspirin and Brilinta 60 twice daily. 12/2021 admitted for right frontal SO and small left pontine infarct, MRA head and neck negative, ESR and CRP negative, LDL 36, A1c 6.3, P2Y12 = 39, continued on aspirin and Brilinta. 08/2022 admitted for left-sided weakness, dizziness and aphasia.  MRI showed left BG punctate infarct.  CTA head and neck unremarkable.  LDL 35, A1c 5.9.  EF 55 to 70%. 10/2022 admitted for double vision, MRI showed right temporal lobe and left CR, left lateral splenium infarcts. 12/2022 ESR and CRP negative, free protein S antigen normal, protein C activity normal, Antithrombin  III normal, homocystine level 23.6, antiphospholipid antibody negative 01/2023 hypercoagulable workup showed homocystine 23.7, free protein S antigen 59, antiphospholipid antibodies negative, factor V Leyden mutation negative, Antithrombin III activity normal, ACE negative 04/2023 admitted for decreased vision, diplopia and nausea vomiting.  MRI showed right frontal parietal small infarct, questionable right splenium small infarct.  Continue aspirin Brilinta as well as Lipitor 80.  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, discharged with Keppra 03/2018 CSF showed WBC 30, RBC 0, protein 57, glucose 221, infectious work up neg  Off Keppra 05/2021, so far no recurrence Follow-up with [facility]  Diabetes HgbA1c 5.1 goal < 7.0 Controlled Home meds including Ozempic CBG monitoring SSI DM education and close PCP follow up  Hypertension Stable on the high end Avoid low BP Long term BP goal normotensive  Hyperlipidemia Home meds: Lipitor 80 LDL 44, goal < 70 Now on Lipitor 80 Continue statin at discharge  Other Stroke Risk Factors Obstructive sleep apnea, on CPAP at home  Other Active Problems CKD 4, creatinine 2.84 Mild leukopenia, WBCs 3.8 Depression on Prozac  Hospital day # 0   Consuelo Denmark, MD PhD Stroke Neurology 09/23/2023 2:09 PM    To contact Stroke Continuity provider, please refer to [email or web address]. After hours, contact General Neurology

## 2023-09-23 NOTE — Progress Notes (Signed)
 [facility]  PROGRESS NOTE  Christina Orozco ZOX:[id number] DOB: 10-02-1970 DOA: 09/21/2023 PCP: Hampton Levins, PA-C   Brief HPI:     53 y.o. 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   Patient reports developing worsening right-sided weakness involving her arm and leg around 8 AM on 4/11.  She says she has gait imbalance at baseline from prior strokes.  She says she did not fall. CT head without contrast negative for acute intracranial normality or significant interval change.  Multiple remote lacunar infarcts in the thalami bilaterally and stable extensive periventricular white matter disease bilaterally noted.   MRI brain without contrast showed 4 mm acute ischemic left pontine infarct.  No associated mass effect or hemorrhage.  Underlying advanced chronic microvascular schema disease with multiple remote lacunar infarct involving bilateral basal ganglia, thalami, and pons.  Multiple chronic microhemorrhages most pronounced about the pons and thalami, most characteristic of poorly controlled hypertension.  Assessment/Plan:   Acute ischemic left pontine stroke History of recurrent CVAs with residual right-sided weakness: -Presented with worsening right-sided weakness -Found to have acute ischemic left pontine stroke on brain MRI -MRA of head and neck did not show large vessel occlusion -Echocardiogram showed EF 55 to 60%, right ventricle systolic function is normal - Keep on telemetry, continue neurochecks - Allowing permissive hypertension - Continue DAPT regimen with aspirin 81 mg BID and Brilinta 90 mg BID - Continue atorvastatin - PT/OT/SLP eval - Hemoglobin A1c 5.1 and lipid panel showed LDL 44 -Neurology is  considering cerebral angiogram, angiogram planned for tomorrow morning   CKD stage IV: Patient's creatinine is at baseline, at 2.90 -I called and confirmed with nephrology, Dr. Ansel Kingdom.  She recommends giving IV fluids 3 amps of sodium bicarb plus D5W at 50 mL/h, 12-hour prior to procedure and 50 mL/h 12-hour postprocedure.  - Will start IV fluids tonight at 7 PM   Anemia of chronic disease: Hemoglobin 8.1   No obvious bleeding.   Continue to monitor.   Type 2 diabetes: Holding home meds and placed on SSI. CBG well-controlled   Hypertension: Holding home meds while allowing permissive hypertension.   Mild intermittent asthma: Stable, continue Singulair and albuterol as needed.   Hyperlipidemia: Continue atorvastatin.   Depression/anxiety: Continue fluoxetine.   Constipation: Start bowel regimen.   OSA: Continue CPAP nightly.    Medications     aspirin EC  81 mg Oral BID   atorvastatin  80 mg Oral QHS   FLUoxetine  20 mg Oral Daily   heparin  5,000 Units Subcutaneous Q8H   insulin aspart  0-9 Units Subcutaneous TID WC   montelukast  10 mg Oral QHS   pantoprazole  40 mg Oral BID   polyethylene glycol  17 g Oral BID   senna-docusate  1 tablet Oral BID   sodium bicarbonate  650 mg Oral TID     Data Reviewed:   CBG:  Recent Labs  Lab 09/22/23 1231 09/22/23 1628 09/22/23 2151 09/23/23 0610 09/23/23 1157  GLUCAP 90 127* 107* 87 104*    SpO2: 100 %    Vitals:   09/22/23 2353 09/23/23 0415 09/23/23 0825 09/23/23 1157  BP:  (!) 156/84 (!) 165/88 (!) 179/83  Pulse: 73 73 78 79  Resp: 13 12  19   Temp:  98.7 F (37.1 C) 98.3 F (36.8 C) 97.7 F (36.5 C)  TempSrc:  Oral Oral Oral  SpO2:  100% 100% 100%  Weight:      Height:          Data Reviewed:  Basic Metabolic Panel: Recent Labs  Lab 09/21/23 1421 09/22/23 0815 09/23/23 1150  NA 142 144 143  K 4.3 3.8 4.3  CL 109 112* 112*  CO2 21* 22 21*  GLUCOSE 97 102* 101*  BUN 36* 34*  30*  CREATININE 2.84* 2.94* 2.89*  CALCIUM 9.3 9.3 9.6    CBC: Recent Labs  Lab 09/21/23 1421 09/22/23 0815  WBC 3.8* 3.9*  NEUTROABS 1.8  --   HGB 8.8* 8.1*  HCT 27.1* 24.5*  MCV 90.9 90.7  PLT 202 201    LFT Recent Labs  Lab 09/21/23 1421  AST 33  ALT 35  ALKPHOS 43  BILITOT 0.7  PROT 7.3  ALBUMIN 4.0     Antibiotics: Anti-infectives (From admission, onward)    Start     Dose/Rate Route Frequency Ordered Stop   09/21/23 1930  cefTRIAXone (ROCEPHIN) 1 g in sodium chloride 0.9 % 100 mL IVPB        1 g 200 mL/hr over 30 Minutes Intravenous  Once 09/21/23 1922 09/21/23 2207        DVT prophylaxis: Heparin  Code Status: Full code  Family Communication: No family at bedside   CONSULTS neurology   Subjective   Complains of weakness in left upper extremity   Objective    Physical Examination:   General-appears in no acute distress Heart-S1-S2, regular, no murmur auscultated Lungs-clear to auscultation bilaterally, no wheezing or crackles auscultated Abdomen-soft, nontender, no organomegaly Extremities-no edema in the lower extremities Neuro-alert, oriented x3, no focal deficit noted, moving all extremities, no weakness in left upper or lower extremities appreciated  Status is: Inpatient:             Christina Orozco   [facility] If 7PM-7AM, please contact night-coverage at [email or web address], Office  [phone]   09/23/2023, 1:07 PM  LOS: 0 days

## 2023-09-23 NOTE — Consult Note (Signed)
 NIR consult  Patient ID: Christina Orozco MRN: [id number]; DOB: July 10, 1970  Admit date: 09/21/2023 Date of Consult: 09/23/2023  PCP:  Hampton Levins, PA-C    Patient Profile:   Christina Orozco is a 53 y.o. female with a hx of multiple strokes who is being seen 09/23/2023 for consideration of diagnostic cerebral angiogram to evaluate for vasculitis  History of Present Illness:   Christina Orozco has a history of multiple CVAs, DM, CKD who presented 09/21/23 with symptoms of right sided weakness and imaging notable for small left pontine infarct in the setting of dual antiplatelet therapy with brilinta and aspirin.  She has a history of at least 4 prior infarcts (both anterior and posterior circulation).   Past Medical History:  Diagnosis Date   Anemia    severe   Anxiety    Asthma    CKD (chronic kidney disease)    stage IV   DDD (degenerative disc disease), cervical    Depression    Diabetes mellitus (HCC)    Dislocated shoulder    right   GERD (gastroesophageal reflux disease)    Headache    migraines (about once a month)   History of kidney stones    Hypertension    Neuropathy    Peripheral vascular disease (HCC)    Pneumonia    Stroke [facility])     Past Surgical History:  Procedure Laterality Date   ADENOIDECTOMY     APPENDECTOMY     CERVICAL SPINE SURGERY  06/2012   C5-C6   EYE SURGERY     left eye surgery    FOOT SURGERY     KENALOG INJECTION Left 03/10/2021   Procedure: Intravitreal Avastin Injection;  Surgeon: Linard Reno, MD;  Location: [facility] OR;  Service: Ophthalmology;  Laterality: Left;   LOOP RECORDER INSERTION N/A 08/27/2017   Procedure: LOOP RECORDER INSERTION;  Surgeon: Luana Rumple, MD;  Location: [facility] INVASIVE CV LAB;  Service: Cardiovascular;  Laterality: N/A;   MEMBRANE PEEL Left 03/10/2021   Procedure: MEMBRANE PEEL;  Surgeon: Linard Reno, MD;  Location: [facility] OR;  Service: Ophthalmology;  Laterality: Left;   PARS PLANA VITRECTOMY Left  03/10/2021   Procedure: TWENTY-FIVE GAUGE PARS PLANA VITRECTOMY;  Surgeon: Linard Reno, MD;  Location: [facility] OR;  Service: Ophthalmology;  Laterality: Left;   PHOTOCOAGULATION WITH LASER Left 03/10/2021   Procedure: PHOTOCOAGULATION WITH LASER;  Surgeon: Linard Reno, MD;  Location: [facility] OR;  Service: Ophthalmology;  Laterality: Left;   SHOULDER SURGERY     TEE WITHOUT CARDIOVERSION N/A 08/27/2017   Procedure: TRANSESOPHAGEAL ECHOCARDIOGRAM (TEE);  Surgeon: Luana Rumple, MD;  Location: [facility] ENDOSCOPY;  Service: Cardiovascular;  Laterality: N/A;   TONSILLECTOMY        Inpatient Medications: Scheduled Meds:  aspirin EC  81 mg Oral BID   atorvastatin  80 mg Oral QHS   FLUoxetine  20 mg Oral Daily   heparin  5,000 Units Subcutaneous Q8H   insulin aspart  0-9 Units Subcutaneous TID WC   montelukast  10 mg Oral QHS   pantoprazole  40 mg Oral BID   polyethylene glycol  17 g Oral BID   senna-docusate  1 tablet Oral BID   sodium bicarbonate  650 mg Oral TID   Continuous Infusions:  PRN Meds: acetaminophen **OR** acetaminophen (TYLENOL) oral liquid 160 mg/5 mL **OR** acetaminophen, albuterol, melatonin, prochlorperazine  Allergies:    Allergies  Allergen Reactions   Lisinopril Swelling and Other (See Comments)    Facial swelling  angioedema  Penicillins Hives    Has patient had a PCN reaction causing immediate rash, facial/tongue/throat swelling, SOB or lightheadedness with hypotension: yes Has patient had a PCN reaction causing severe rash involving mucus membranes or skin necrosis: No Has patient had a PCN reaction that required hospitalization No Has patient had a PCN reaction occurring within the last 10 years: No If all of the above answers are "NO", then may proceed wit Rash on buttock that makes sitting very difficult   Quetiapine Other (See Comments)    Unknown    Gabapentin Swelling and Other (See Comments)    "I thought I was having another stroke." dizziness    Social  History:   Social History   Socioeconomic History   Marital status: Single    Spouse name: Not on file   Number of children: 3   Years of education: 12+   Highest education level: Some college, no degree  Occupational History   Not on file  Tobacco Use   Smoking status: Never   Smokeless tobacco: Never  Vaping Use   Vaping status: Never Used  Substance and Sexual Activity   Alcohol use: No   Drug use: No   Sexual activity: Not Currently  Other Topics Concern   Not on file  Social History Narrative   Lives with daughter   Caffeine use: "once in a blue moon"   Right handed   Social Drivers of Health   Financial Resource Strain: Medium Risk (09/06/2022)   Received [facility]   Overall Financial Resource Strain (CARDIA)    Difficulty of Paying Living Expenses: Somewhat hard  Food Insecurity: No Food Insecurity (09/22/2023)   Hunger Vital Sign    Worried About Running Out of Food in the Last Year: Never true    Ran Out of Food in the Last Year: Never true  Transportation Needs: No Transportation Needs (09/22/2023)   PRAPARE - Administrator, Civil Service (Medical): No    Lack of Transportation (Non-Medical): No  Physical Activity: Not on file  Stress: Not on file  Social Connections: Unknown (10/10/2021)   Received from [facility], [facility]   Social Network    Social Network: Not on file  Intimate Partner Violence: Not At Risk (09/22/2023)   Humiliation, Afraid, Rape, and Kick questionnaire    Fear of Current or Ex-Partner: No    Emotionally Abused: No    Physically Abused: No    Sexually Abused: No     Family History  Problem Relation Age of Onset   Vascular Disease Mother    CAD Mother    Heart failure Mother    Heart disease Other    Cancer Other        colon, stomach, pancreatic, lung   Diabetes Other    Breast cancer Sister 30   Seizures Maternal Uncle      ROS:  Please see the history of present illness.    All other ROS reviewed and negative.     Physical Exam/Data:   Vitals:   09/22/23 2353 09/23/23 0415 09/23/23 0825 09/23/23 1157  BP:  (!) 156/84 (!) 165/88 (!) 179/83  Pulse: 73 73 78 79  Resp: 13 12  19   Temp:  98.7 F (37.1 C) 98.3 F (36.8 C) 97.7 F (36.5 C)  TempSrc:  Oral Oral Oral  SpO2:  100% 100% 100%  Weight:      Height:        Intake/Output Summary (  Last 24 hours) at 09/23/2023 1213 Last data filed at 09/22/2023 1612 Gross per 24 hour  Intake 700 ml  Output --  Net 700 ml      09/21/2023    1:41 PM 07/16/2023    2:46 PM 06/08/2023   11:09 AM  Last 3 Weights  Weight (lbs) 131 lb 133 lb 144 lb  Weight (kg) 59.421 kg 60.328 kg 65.318 kg     Body mass index is 23.58 kg/m.  General:  Well nourished, well developed, in no acute distress HEENT: right facial droop Neck: no JVD Vascular: No carotid bruits; Distal pulses 2+ bilaterally Cardiac:  RRR Lungs:  clear to auscultation bilaterally, no wheezing, rhonchi or rales  Abd: soft, nontender, no hepatomegaly  Ext: no edema Musculoskeletal:  No deformities Skin: warm and dry  Neuro:  RUE and RLE weakness Psych:  Normal affect  Arterial: Radial pulses are palpable, but not as strong as DP pulses.  Laboratory Data:  High Sensitivity Troponin:  No results for input(s): "TROPONINIHS" in the last 720 hours.   Chemistry Recent Labs  Lab 09/21/23 1421 09/22/23 0815  NA 142 144  K 4.3 3.8  CL 109 112*  CO2 21* 22  GLUCOSE 97 102*  BUN 36* 34*  CREATININE 2.84* 2.94*  CALCIUM 9.3 9.3  GFRNONAA 20* 19*  ANIONGAP 12 10    Recent Labs  Lab 09/21/23 1421  PROT 7.3  ALBUMIN 4.0  AST 33  ALT 35  ALKPHOS 43  BILITOT 0.7   Lipids  Recent Labs  Lab 09/22/23 0815  CHOL 128  TRIG 59  HDL 72  LDLCALC 44  CHOLHDL 1.8    Hematology Recent Labs  Lab 09/21/23 1421 09/22/23 0815  WBC 3.8* 3.9*  RBC 2.98* 2.70*  HGB 8.8* 8.1*  HCT 27.1* 24.5*  MCV 90.9 90.7  MCH 29.5 30.0  MCHC 32.5 33.1   RDW 15.8* 15.9*  PLT 202 201   Thyroid  Recent Labs  Lab 09/22/23 1222  TSH 3.490     Radiology/Studies:   MRI Brain 09/21/23:  IMPRESSION: 1. 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 No associated mass effect or hemorrhage. 2. Underlying advanced chronic microvascular ischemic disease, with multiple remote lacunar infarcts about the bilateral basal ganglia, thalami, and pons. 3. Multiple chronic micro hemorrhages, most pronounced about the pons and thalami, most characteristic of chronic poorly controlled hypertension.  MRA 09/21/23 reviewed and without clear abnormality.  Assessment and Plan:   53 yo female with history of multiple strokes in the setting of dual antiplatelet therapy.  NIR consulted for consideration for cerebral angiography to evaluate for vasculitis.  Diagnostic cerebral angiogram: Discussed procedure in detail with the patient and her brother Royston Cornea. Reviewed rationale and possible risks.  Emphasized possible risks of worsening renal insufficiency with the patient and medical team.  In an effort to minimize this risk, hydration protocol will be initiated as recommended by nephrology.  Patient will be re-evaluated tomorrow to ensure patient is in agreement and medical condition is stable.   Zilphia Hilt, MD  09/23/2023 12:13 PM

## 2023-09-23 NOTE — Plan of Care (Signed)

## 2023-09-23 NOTE — Care Management Obs Status (Signed)
 MEDICARE OBSERVATION STATUS NOTIFICATION   Patient Details  Name: Christina Orozco MRN: [id number] Date of Birth: [birth year]   Medicare Observation Status Notification Given:  Yes    Omie Bickers, RN 09/23/2023, 7:53 AM

## 2023-09-23 NOTE — Progress Notes (Signed)
 VASCULAR LAB    TCD bubble study has been performed.  See CV proc for preliminary results.   Dorissa Stinnette, RVT 09/23/2023, 3:21 PM

## 2023-09-23 NOTE — Progress Notes (Signed)
 VASCULAR LAB    Carotid duplex has been performed.  See CV proc for preliminary results.   Arnulfo Batson, RVT 09/23/2023, 3:20 PM

## 2023-09-24 ENCOUNTER — Inpatient Hospital Stay (HOSPITAL_COMMUNITY)

## 2023-09-24 DIAGNOSIS — D638 Anemia in other chronic diseases classified elsewhere: Secondary | ICD-10-CM | POA: Diagnosis not present

## 2023-09-24 DIAGNOSIS — E785 Hyperlipidemia, unspecified: Secondary | ICD-10-CM | POA: Diagnosis not present

## 2023-09-24 DIAGNOSIS — G4733 Obstructive sleep apnea (adult) (pediatric): Secondary | ICD-10-CM | POA: Diagnosis not present

## 2023-09-24 DIAGNOSIS — I639 Cerebral infarction, unspecified: Secondary | ICD-10-CM | POA: Diagnosis not present

## 2023-09-24 DIAGNOSIS — E1122 Type 2 diabetes mellitus with diabetic chronic kidney disease: Secondary | ICD-10-CM | POA: Diagnosis not present

## 2023-09-24 DIAGNOSIS — I129 Hypertensive chronic kidney disease with stage 1 through stage 4 chronic kidney disease, or unspecified chronic kidney disease: Secondary | ICD-10-CM | POA: Diagnosis not present

## 2023-09-24 DIAGNOSIS — I6389 Other cerebral infarction: Secondary | ICD-10-CM | POA: Diagnosis not present

## 2023-09-24 LAB — BASIC METABOLIC PANEL WITH GFR
Anion gap: 10 (ref 5–15)
BUN: 30 mg/dL — ABNORMAL HIGH (ref 6–20)
CO2: 28 mmol/L (ref 22–32)
Calcium: 9.6 mg/dL (ref 8.9–10.3)
Chloride: 105 mmol/L (ref 98–111)
Creatinine, Ser: 2.81 mg/dL — ABNORMAL HIGH (ref 0.44–1.00)
GFR, Estimated: 20 mL/min — ABNORMAL LOW (ref >60)
Glucose, Bld: 103 mg/dL — ABNORMAL HIGH (ref 70–99)
Potassium: 4.2 mmol/L (ref 3.5–5.1)
Sodium: 143 mmol/L (ref 135–145)

## 2023-09-24 LAB — GLUCOSE, CAPILLARY
Glucose-Capillary: 97 mg/dL (ref 70–99)
Glucose-Capillary: 122 mg/dL — ABNORMAL HIGH (ref 70–99)
Glucose-Capillary: 94 mg/dL (ref 70–99)
Glucose-Capillary: 119 mg/dL — ABNORMAL HIGH (ref 70–99)
Glucose-Capillary: 103 mg/dL — ABNORMAL HIGH (ref 70–99)

## 2023-09-24 LAB — HOMOCYSTEINE: Homocysteine: 22.5 umol/L — ABNORMAL HIGH (ref 0.0–14.5)

## 2023-09-24 LAB — LIPASE, BLOOD: Lipase: 41 U/L (ref 11–51)

## 2023-09-24 LAB — MAGNESIUM: Magnesium: 2.1 mg/dL (ref 1.7–2.4)

## 2023-09-24 LAB — PROTEIN C, TOTAL: Protein C, Total: 119 % (ref 60–150)

## 2023-09-24 MED ORDER — CARVEDILOL 12.5 MG PO TABS
12.5000 mg | ORAL_TABLET | Freq: Two times a day (BID) | ORAL | Status: DC
Start: 2023-09-24 — End: 2023-09-29
  Administered 2023-09-24 – 2023-09-29 (×11): 12.5 mg via ORAL
  Filled 2023-09-24 (×11): qty 1

## 2023-09-24 MED ORDER — HYDRALAZINE HCL 20 MG/ML IJ SOLN
10.0000 mg | INTRAMUSCULAR | Status: DC | PRN
  Administered 2023-09-24 – 2023-09-25 (×3): 10 mg via INTRAVENOUS
  Filled 2023-09-24 (×3): qty 1

## 2023-09-24 MED ORDER — METHOCARBAMOL 1000 MG/10ML IJ SOLN
500.0000 mg | Freq: Four times a day (QID) | INTRAMUSCULAR | Status: DC | PRN
  Administered 2023-09-24 – 2023-09-25 (×2): 500 mg via INTRAVENOUS
  Filled 2023-09-24 (×2): qty 10

## 2023-09-24 NOTE — Progress Notes (Signed)
 SLP Cancellation Note  Patient Details Name: Christina Orozco MRN: [id number] DOB: 12-14-1970   Cancelled treatment:       Reason Eval/Treat Not Completed: Potential angiogram today. Will f/u   Cipriano Millikan, Hardin Leys 09/24/2023, 8:37 AM

## 2023-09-24 NOTE — Progress Notes (Signed)
 CTSP re acute abdominal pain. Apparent onset sometime after am dose of miralax. Pt states pain started left mid abdomen and now in LUQ and in left chest area. On exam focally tender LUQ with hyperactive BS primarily on left side. Pain is colicky in nature. Also pt c/o left leg pain with cramping in calf. Noted with foot/toe flexion contractures/muscle spasms with palpation of left calf.   Plan: KUB Lipase, amylase Robaxin IV q 6 hrs prn- no NSIADs due to AKI

## 2023-09-24 NOTE — Progress Notes (Signed)
 Chaplain responded to a Pt's request for prayers. Pt's sister and niece were in the room, as well. Chaplain tried to explore some of Pt's emotions. Pt shared about her frustration for the uncertainty of her situation. Pt shared with this Chaplain that every time she was given an explanation about what was going on with her and a treatment was in place, the same event and she can't find explanation about why. Pt relies in a support group (survivor's group), where she finds some support. As this Chaplain went over the conversation, Pt started not to feel well, apparently medications were putting her to sleep. Pt asked for prayers and excused for having to close our conversation. Chaplain offered reflective listening and compassionate presence, as well as prayer for Pt. Pt was grateful for Chaplain's visit.  Althea Jesus Chaplain Resident   09/24/23 1154  Spiritual Encounters  Type of Visit Initial  Care provided to: Patient;Pt and family  Referral source Clinical staff  Reason for visit Routine spiritual support  OnCall Visit No  Spiritual Framework  Presenting Themes Meaning/purpose/sources of inspiration  Community/Connection Family;Faith community;Spiritual leader  Patient Stress Factors Exhausted;Lack of knowledge;Loss of control  Interventions  Spiritual Care Interventions Made Compassionate presence;Reflective listening;Meaning making

## 2023-09-24 NOTE — Plan of Care (Signed)
  Problem: Education: Goal: Ability to describe self-care measures that may prevent or decrease complications (Diabetes Survival Skills Education) will improve Outcome: Progressing Goal: Individualized Educational Video(s) Outcome: Progressing   Problem: Coping: Goal: Ability to adjust to condition or change in health will improve Outcome: Progressing   Problem: Fluid Volume: Goal: Ability to maintain a balanced intake and output will improve Outcome: Progressing   Problem: Health Behavior/Discharge Planning: Goal: Ability to identify and utilize available resources and services will improve Outcome: Progressing Goal: Ability to manage health-related needs will improve Outcome: Progressing   Problem: Metabolic: Goal: Ability to maintain appropriate glucose levels will improve Outcome: Progressing   Problem: Education: Goal: Knowledge of disease or condition will improve Outcome: Progressing Goal: Knowledge of secondary prevention will improve (MUST DOCUMENT ALL) Outcome: Progressing Goal: Knowledge of patient specific risk factors will improve (DELETE if not current risk factor) Outcome: Progressing   Problem: Ischemic Stroke/TIA Tissue Perfusion: Goal: Complications of ischemic stroke/TIA will be minimized Outcome: Progressing

## 2023-09-24 NOTE — Progress Notes (Signed)
 [facility]  PROGRESS NOTE  Christina Orozco WUJ:[id number] DOB: Aug 10, 1970 DOA: 09/21/2023 PCP: Kerin Salen, PA-C   Brief HPI:     53 y.o. 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   Patient reports developing worsening right-sided weakness involving her arm and leg around 8 AM on 4/11.  She says she has gait imbalance at baseline from prior strokes.  She says she did not fall. CT head without contrast negative for acute intracranial normality or significant interval change.  Multiple remote lacunar infarcts in the thalami bilaterally and stable extensive periventricular white matter disease bilaterally noted.   MRI brain without contrast showed 4 mm acute ischemic left pontine infarct.  No associated mass effect or hemorrhage.  Underlying advanced chronic microvascular schema disease with multiple remote lacunar infarct involving bilateral basal ganglia, thalami, and pons.  Multiple chronic microhemorrhages most pronounced about the pons and thalami, most characteristic of poorly controlled hypertension.  Assessment/Plan:   Acute ischemic left pontine stroke History of recurrent CVAs with residual right-sided weakness: -Presented with worsening right-sided weakness -Found to have acute ischemic left pontine stroke on brain MRI -MRA of head and neck did not show large vessel occlusion -Echocardiogram showed EF 55 to 60%, right ventricle systolic function is normal - Keep on telemetry, continue neurochecks - Allowing permissive hypertension - Continue DAPT regimen with aspirin 81 mg BID and Brilinta 90 mg BID - Continue atorvastatin - PT/OT/SLP eval - Hemoglobin A1c 5.1 and lipid panel showed LDL 44 -Neurology is  considering cerebral angiogram, angiogram was planned for this morning however got canceled after patient warped abdominal pain. -Neurointerventional radiologist to discuss with patient regarding pros and the cons of cerebral angiogram in setting of CKD stage IV.   CKD stage IV: Patient's creatinine is at baseline, at 2.90 -I called and confirmed with nephrology, Dr. Kathrene Bongo.  She recommends giving IV fluids 3 amps of sodium bicarb plus D5W at 50 mL/h, 12-hour prior to procedure and 50 mL/h 12-hour postprocedure.  - Will start again start IV fluids tonight at 7 PM if cerebral angiogram is going to be planned for tomorrow morning.   Anemia of chronic disease: Hemoglobin 8.1   No obvious bleeding.   Continue to monitor.   Type 2 diabetes: Holding home meds and placed on SSI. CBG well-controlled   Hypertension: Okay to restart meds as per neurology -Restart Coreg 12.5 mg p.o. twice daily -Will start amlodipine from tomorrow morning -Continue hydralazine 10 mg IV every 4 hours as needed for BP more than 160/100   Mild intermittent asthma: Stable, continue Singulair and albuterol as needed.   Hyperlipidemia: Continue atorvastatin.   Depression/anxiety: Continue fluoxetine.   Constipation: Hold MiraLAX due to cramping abdominal pain this morning   OSA: Continue CPAP nightly.    Medications     aspirin EC  81 mg Oral BID   atorvastatin  80 mg Oral QHS   carvedilol  12.5 mg Oral BID WC   FLUoxetine  20 mg Oral Daily   heparin  5,000 Units Subcutaneous Q8H   insulin aspart  0-9 Units Subcutaneous TID WC   montelukast  10 mg Oral QHS   pantoprazole  40 mg Oral BID   senna-docusate  1 tablet Oral BID     Data Reviewed:  CBG:  Recent Labs  Lab 09/23/23 1710 09/23/23 2116 09/24/23 0648 09/24/23 0842 09/24/23 1105  GLUCAP 111* 177* 97 122* 94    SpO2: 100 %    Vitals:   09/24/23 0925 09/24/23 1000 09/24/23 1100 09/24/23 1135  BP: (!) 183/94   (!) 166/90   Pulse:      Resp: 20 (!) 22 (!) 23 20  Temp:    98.3 F (36.8 C)  TempSrc:    Oral  SpO2:    100%  Weight:      Height:          Data Reviewed:  Basic Metabolic Panel: Recent Labs  Lab 09/21/23 1421 09/22/23 0815 09/23/23 1150 09/24/23 0613  NA 142 144 143 143  K 4.3 3.8 4.3 4.2  CL 109 112* 112* 105  CO2 21* 22 21* 28  GLUCOSE 97 102* 101* 103*  BUN 36* 34* 30* 30*  CREATININE 2.84* 2.94* 2.89* 2.81*  CALCIUM 9.3 9.3 9.6 9.6    CBC: Recent Labs  Lab 09/21/23 1421 09/22/23 0815  WBC 3.8* 3.9*  NEUTROABS 1.8  --   HGB 8.8* 8.1*  HCT 27.1* 24.5*  MCV 90.9 90.7  PLT 202 201    LFT Recent Labs  Lab 09/21/23 1421  AST 33  ALT 35  ALKPHOS 43  BILITOT 0.7  PROT 7.3  ALBUMIN 4.0     Antibiotics: Anti-infectives (From admission, onward)    Start     Dose/Rate Route Frequency Ordered Stop   09/21/23 1930  cefTRIAXone (ROCEPHIN) 1 g in sodium chloride 0.9 % 100 mL IVPB        1 g 200 mL/hr over 30 Minutes Intravenous  Once 09/21/23 1922 09/21/23 2207        DVT prophylaxis: Heparin  Code Status: Full code  Family Communication: No family at bedside   CONSULTS neurology   Subjective   Developed cramping abdominal pain this morning, resolved after she received IV Robaxin.  KUB obtained was unremarkable.  Normal lipase and normal magnesium.  Cerebral angiogram scheduled for this morning has been canceled   Objective    Physical Examination:  General-appears in no acute distress Heart-S1-S2, regular, no murmur auscultated Lungs-clear to auscultation bilaterally, no wheezing or crackles auscultated Abdomen-soft, nontender, no organomegaly Extremities-no edema in the lower extremities Neuro-alert, oriented x3, no focal deficit noted   Status is: Inpatient:             Christina Orozco   [facility] If 7PM-7AM, please contact night-coverage at [email or web address], Office  [phone]   09/24/2023, 12:18 PM  LOS: 1 day

## 2023-09-24 NOTE — Progress Notes (Signed)
 Occupational Therapy Treatment Patient Details Name: Christina Orozco MRN: [id number] DOB: 1970-12-16 Today's Date: 09/24/2023   History of present illness Pt is 53 yo female admitted on 09/21/23 with R sided weakness found to have L pontine infarct.  Pt with hx including but not limited to DM, HTN, CKD, anemia, PVD, recurrent CVAs (pt reports one every other month in 2024)   OT comments  Pt progressing toward established OT goals. Checked on pt 2x today with RN requesting hold secondary to elevated BP this morning and then pt with significant abdominal pain this afternoon deferring OOB but agreeable to UE HEP; implemented with yellow theraband. Pt with good body mechanics reporting she has used theraband multiple times in the past. Continue to recommend HHOT at discharge.       If plan is discharge home, recommend the following:  A little help with walking and/or transfers;A little help with bathing/dressing/bathroom;Assistance with cooking/housework;Assist for transportation;Help with stairs or ramp for entrance   Equipment Recommendations  None recommended by OT    Recommendations for Other Services      Precautions / Restrictions Precautions Precautions: Fall Restrictions Weight Bearing Restrictions Per Provider Order: No       Mobility Bed Mobility Overal bed mobility: Needs Assistance Bed Mobility: Supine to Sit, Sit to Supine     Supine to sit: Supervision Sit to supine: Supervision   General bed mobility comments: increased time    Transfers                         Balance Overall balance assessment: Needs assistance Sitting-balance support: No upper extremity supported Sitting balance-Leahy Scale: Good                                     ADL either performed or assessed with clinical judgement   ADL Overall ADL's : Needs assistance/impaired                                       General ADL Comments: Pt with max  pain in stomach on arrival; deferring OOB; checked on pt earlier today and RN asking to hold secondary to elevated BP.    Extremity/Trunk Assessment Upper Extremity Assessment Upper Extremity Assessment: Generalized weakness;RUE deficits/detail RUE Deficits / Details: weaker as compared to L; Implemented HEP with yellow Tband see exercises below.   Lower Extremity Assessment Lower Extremity Assessment: Defer to PT evaluation        Vision   Vision Assessment?: No apparent visual deficits Additional Comments: ot confirms today no new visual deficits   Perception     Praxis     Communication Communication Communication: No apparent difficulties   Cognition Arousal: Alert Behavior During Therapy: WFL for tasks assessed/performed Cognition: No family/caregiver present to determine baseline             OT - Cognition Comments: pt cognition seems much more clear today. pt with good insight into current health status. Following 2 step commands during session                 Following commands: Intact        Cueing   Cueing Techniques: Verbal cues  Exercises Exercises: Other exercises Other Exercises Other Exercises: Pt performing BUE scapular row x15 with yellow tband, BUE elbow  flexion x15, external shoulder rotationx10, shoulder flexion x10 all with yellow tband    Shoulder Instructions       General Comments      Pertinent Vitals/ Pain       Pain Assessment Pain Assessment: Faces Faces Pain Scale: Hurts whole lot Pain Location: stomach Pain Descriptors / Indicators: Discomfort Pain Intervention(s): Limited activity within patient's tolerance, Monitored during session  Home Living                                          Prior Functioning/Environment              Frequency  Min 2X/week        Progress Toward Goals  OT Goals(current goals can now be found in the care plan section)  Progress towards OT goals: Progressing  toward goals  Acute Rehab OT Goals Patient Stated Goal: go home OT Goal Formulation: With patient Time For Goal Achievement: 10/06/23 Potential to Achieve Goals: Good ADL Goals Pt Will Perform Grooming: standing;with modified independence Pt Will Perform Upper Body Dressing: with modified independence;sitting Pt Will Perform Lower Body Dressing: with modified independence;sitting/lateral leans Pt Will Transfer to Toilet: with supervision;ambulating Pt/caregiver will Perform Home Exercise Program: Increased strength;Right Upper extremity;With theraband  Plan      Co-evaluation                 AM-PAC OT "6 Clicks" Daily Activity     Outcome Measure   Help from another person eating meals?: None Help from another person taking care of personal grooming?: A Little Help from another person toileting, which includes using toliet, bedpan, or urinal?: A Little Help from another person bathing (including washing, rinsing, drying)?: A Little Help from another person to put on and taking off regular upper body clothing?: A Little Help from another person to put on and taking off regular lower body clothing?: A Little 6 Click Score: 19    End of Session Equipment Utilized During Treatment: Gait belt;Rolling walker (2 wheels)  OT Visit Diagnosis: Unsteadiness on feet (R26.81);Muscle weakness (generalized) (M62.81);Low vision, both eyes (H54.2);Pain Pain - Right/Left: Right Pain - part of body: Knee   Activity Tolerance Patient tolerated treatment well   Patient Left in bed;with call bell/phone within reach;with bed alarm set   Nurse Communication Mobility status        Time: 1350-1416 OT Time Calculation (min): 26 min  Charges: OT General Charges $OT Visit: 1 Visit OT Treatments $Therapeutic Exercise: 23-37 mins  Karilyn Ouch, OTR/L [facility] Acute Rehabilitation Office: [phone]   Emery Hans 09/24/2023, 3:06 PM

## 2023-09-24 NOTE — Evaluation (Signed)
 Speech Language Pathology Evaluation Patient Details Name: Christina Orozco MRN: [id number] DOB: [birth year] Today's Date: 09/24/2023 Time: 1308-6578 SLP Time Calculation (min) (ACUTE ONLY): 12 min  Problem List:  Patient Active Problem List   Diagnosis Date Noted   Left pontine stroke (HCC) 09/21/2023   Fecal occult blood test positive 04/23/2023   Stroke (cerebrum) (HCC) 04/17/2023   Acute ischemic stroke (HCC) 04/14/2023   Prolonged QT interval 04/14/2023   Diplopia 10/31/2022   OSA (obstructive sleep apnea) 08/25/2022   Subcortical infarction (HCC) 08/23/2022   Overweight (BMI 25.0-29.9) 08/23/2022   Asthma, chronic 08/22/2022   TIA (transient ischemic attack) 06/20/2022   Numbness and tingling in left hand 03/02/2022   Gait disturbance, post-stroke 03/02/2022   AKI (acute kidney injury) (HCC) 12/23/2021   Acute embolic stroke (HCC) 12/23/2021   Hypernatremia 12/20/2021   CKD (chronic kidney disease), stage IV (HCC) 12/19/2021   Left-sided weakness and visual deficits  12/19/2021   Vision abnormalities 12/19/2021   Acute stroke due to ischemia (HCC) 11/10/2021   Depression 07/05/2020   CVA (cerebral vascular accident) (HCC) 06/21/2020   Generalized anxiety disorder 03/01/2019   Abnormality of gait 10/14/2018   Dizziness and giddiness 09/13/2018   Generalized weakness 06/26/2018   Chronic nonintractable headache 04/26/2018   Diastolic dysfunction    Congestion of nasal sinus    Pneumonia of left lower lobe due to infectious organism    SOB (shortness of breath)    Candidiasis    History of anemia due to chronic kidney disease    Acute on chronic anemia    Hypoalbuminemia due to protein-calorie malnutrition (HCC)    Hypertension associated with diabetes (HCC)    Type 2 diabetes mellitus with chronic kidney disease, without long-term current use of insulin (HCC)    PRES (posterior reversible encephalopathy syndrome)    Hypokalemia    History of CVA with residual  deficit    History of seizure 03/25/2018   Bilateral carpal tunnel syndrome 02/14/2018   Arthritis 10/31/2017   Asthma 10/31/2017   Constipation 10/31/2017   Kidney stone 10/31/2017   Anemia, chronic disease 08/09/2017   Hyperlipidemia associated with type 2 diabetes mellitus (HCC) 07/26/2017   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HCC) 07/25/2017   Anxiety as acute reaction to exceptional stress 04/23/2017   Insomnia 10/08/2016   Neuropathy 06/07/2016   Gait instability    Lesion of pons    Slurred speech    Vertigo 04/30/2016   Hyperglycemia 04/30/2016   Chronic kidney disease (CKD), stage III (moderate) (HCC) 04/30/2016   Ataxia 04/30/2016   Chronic right shoulder pain 04/03/2016   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   Rhinitis, allergic 08/25/2015   Mild intermittent asthma 07/14/2015   Gastroesophageal reflux disease without esophagitis 06/08/2015   Hypertensive cardiovascular disease 05/05/2015   Vitamin D deficiency 05/05/2015   Past Medical History:  Past Medical History:  Diagnosis Date   Anemia    severe   Anxiety    Asthma    CKD (chronic kidney disease)    stage IV   DDD (degenerative disc disease), cervical    Depression    Diabetes mellitus (HCC)    Dislocated shoulder    right   GERD (gastroesophageal reflux disease)    Headache    migraines (about once a month)   History of kidney stones    Hypertension    Neuropathy    Peripheral vascular disease (HCC)    Pneumonia    Stroke [facility])    Past Surgical History:  Past Surgical History:  Procedure Laterality Date   ADENOIDECTOMY     APPENDECTOMY     CERVICAL SPINE SURGERY  06/2012   C5-C6   EYE SURGERY     left eye surgery    FOOT SURGERY     KENALOG INJECTION Left 03/10/2021   Procedure: Intravitreal Avastin Injection;  Surgeon: Stephannie Li, MD;  Location: [facility] OR;  Service: Ophthalmology;  Laterality: Left;   LOOP RECORDER INSERTION N/A 08/27/2017   Procedure: LOOP RECORDER INSERTION;  Surgeon:  Thurmon Fair, MD;  Location: [facility] INVASIVE CV LAB;  Service: Cardiovascular;  Laterality: N/A;   MEMBRANE PEEL Left 03/10/2021   Procedure: MEMBRANE PEEL;  Surgeon: Stephannie Li, MD;  Location: [facility] OR;  Service: Ophthalmology;  Laterality: Left;   PARS PLANA VITRECTOMY Left 03/10/2021   Procedure: TWENTY-FIVE GAUGE PARS PLANA VITRECTOMY;  Surgeon: Stephannie Li, MD;  Location: [facility] OR;  Service: Ophthalmology;  Laterality: Left;   PHOTOCOAGULATION WITH LASER Left 03/10/2021   Procedure: PHOTOCOAGULATION WITH LASER;  Surgeon: Stephannie Li, MD;  Location: [facility] OR;  Service: Ophthalmology;  Laterality: Left;   SHOULDER SURGERY     TEE WITHOUT CARDIOVERSION N/A 08/27/2017   Procedure: TRANSESOPHAGEAL ECHOCARDIOGRAM (TEE);  Surgeon: Thurmon Fair, MD;  Location: [facility] ENDOSCOPY;  Service: Cardiovascular;  Laterality: N/A;   TONSILLECTOMY     HPI:  Christina Orozco is a 53 yo female presenting to ED 4/11 with R sided weakness. MRI Brain shows acute ischemic L pontine infarct. Seen by SLP throughout 2024 for swallowing and cognition. She scored 28/30 on the SLUMS 11/11/21 and 25/30 on 08/24/22. PMH includes T2DM, HTN, CKD, anemia, PVD, recurrent CVA   Assessment / Plan / Recommendation Clinical Impression  Pt states she feels she is close to her baseline. She reports she lives alone with [facility] aide to provide assistance with medication management as needed. Pt performed [facility] on all tasks assessing memory, problem solving, attention, and language. Encouraged pt to monitor for changes in performance on functional tasks and advocate for SLP f/u is she feels it is warranted. Pt agrees SLP f/u is not needed acutely. Will sign off at this time.    SLP Assessment  SLP Recommendation/Assessment: Patient does not need any further Speech Lanaguage Pathology Services SLP Visit Diagnosis: Cognitive communication deficit (R41.841)    Recommendations for follow up therapy are one component of a multi-disciplinary discharge  planning process, led by the attending physician.  Recommendations may be updated based on patient status, additional functional criteria and insurance authorization.    Follow Up Recommendations  No SLP follow up    Assistance Recommended at Discharge  PRN  Functional Status Assessment Patient has not had a recent decline in their functional status  Frequency and Duration           SLP Evaluation Cognition  Overall Cognitive Status: Within Functional Limits for tasks assessed       Comprehension  Auditory Comprehension Overall Auditory Comprehension: Appears within functional limits for tasks assessed    Expression Expression Primary Mode of Expression: Verbal Verbal Expression Overall Verbal Expression: Appears within functional limits for tasks assessed   Oral / Motor  Oral Motor/Sensory Function Overall Oral Motor/Sensory Function: Within functional limits Motor Speech Overall Motor Speech: Appears within functional limits for tasks assessed            Gwynneth Aliment, M.A., CCC-SLP Speech Language Pathology, Acute Rehabilitation Services  Secure Chat preferred [phone]  09/24/2023, 4:52 PM

## 2023-09-24 NOTE — Plan of Care (Signed)
  Problem: Education: Goal: Ability to describe self-care measures that may prevent or decrease complications (Diabetes Survival Skills Education) will improve Outcome: Progressing Goal: Individualized Educational Video(s) Outcome: Progressing   Problem: Coping: Goal: Ability to adjust to condition or change in health will improve Outcome: Progressing   Problem: Fluid Volume: Goal: Ability to maintain a balanced intake and output will improve Outcome: Progressing   Problem: Health Behavior/Discharge Planning: Goal: Ability to identify and utilize available resources and services will improve Outcome: Progressing Goal: Ability to manage health-related needs will improve Outcome: Progressing   Problem: Metabolic: Goal: Ability to maintain appropriate glucose levels will improve Outcome: Progressing   Problem: Education: Goal: Knowledge of disease or condition will improve 09/24/2023 1613 by Zulema Hitchcock, RN Outcome: Progressing 09/24/2023 1610 by Zulema Hitchcock, RN Outcome: Progressing Goal: Knowledge of secondary prevention will improve (MUST DOCUMENT ALL) 09/24/2023 1613 by Zulema Hitchcock, RN Outcome: Progressing 09/24/2023 1610 by Zulema Hitchcock, RN Outcome: Progressing Goal: Knowledge of patient specific risk factors will improve (DELETE if not current risk factor) 09/24/2023 1613 by Zulema Hitchcock, RN Outcome: Progressing 09/24/2023 1610 by Zulema Hitchcock, RN Outcome: Progressing   Problem: Ischemic Stroke/TIA Tissue Perfusion: Goal: Complications of ischemic stroke/TIA will be minimized 09/24/2023 1613 by Zulema Hitchcock, RN Outcome: Progressing 09/24/2023 1610 by Zulema Hitchcock, RN Outcome: Progressing

## 2023-09-24 NOTE — Progress Notes (Signed)
 STROKE TEAM PROGRESS NOTE   SUBJECTIVE (INTERVAL HISTORY) No family is at the bedside. Pt lying in bed, had abdominal pain this morning, currently improved with medication.  However, she declined cerebral angiogram today given concern of kidney failure.  But still okay to proceed with LP tomorrow.   OBJECTIVE Temp:  [97.5 F (36.4 C)-98.3 F (36.8 C)] 98.2 F (36.8 C) (04/14 1602) Pulse Rate:  [64-80] 72 (04/14 1602) Cardiac Rhythm: Normal sinus rhythm (04/14 0746) Resp:  [10-23] 16 (04/14 1700) BP: (166-199)/(76-106) 178/102 (04/14 1602) SpO2:  [100 %] 100 % (04/14 1602)  Recent Labs  Lab 09/23/23 2116 09/24/23 0648 09/24/23 0842 09/24/23 1105 09/24/23 1654  GLUCAP 177* 97 122* 94 119*   Recent Labs  Lab 09/21/23 1421 09/22/23 0815 09/23/23 1150 09/24/23 0613 09/24/23 1128  NA 142 144 143 143  --   K 4.3 3.8 4.3 4.2  --   CL 109 112* 112* 105  --   CO2 21* 22 21* 28  --   GLUCOSE 97 102* 101* 103*  --   BUN 36* 34* 30* 30*  --   CREATININE 2.84* 2.94* 2.89* 2.81*  --   CALCIUM 9.3 9.3 9.6 9.6  --   MG  --   --   --   --  2.1   Recent Labs  Lab 09/21/23 1421  AST 33  ALT 35  ALKPHOS 43  BILITOT 0.7  PROT 7.3  ALBUMIN 4.0   Recent Labs  Lab 09/21/23 1421 09/22/23 0815  WBC 3.8* 3.9*  NEUTROABS 1.8  --   HGB 8.8* 8.1*  HCT 27.1* 24.5*  MCV 90.9 90.7  PLT 202 201   No results for input(s): "CKTOTAL", "CKMB", "CKMBINDEX", "TROPONINI" in the last 168 hours. No results for input(s): "LABPROT", "INR" in the last 72 hours. No results for input(s): "COLORURINE", "LABSPEC", "PHURINE", "GLUCOSEU", "HGBUR", "BILIRUBINUR", "KETONESUR", "PROTEINUR", "UROBILINOGEN", "NITRITE", "LEUKOCYTESUR" in the last 72 hours.  Invalid input(s): "APPERANCEUR"      Component Value Date/Time   CHOL 128 09/22/2023 0815   CHOL 135 06/17/2020 1053   TRIG 59 09/22/2023 0815   HDL 72 09/22/2023 0815   HDL 47 06/17/2020 1053   CHOLHDL 1.8 09/22/2023 0815   VLDL 12 09/22/2023  0815   LDLCALC 44 09/22/2023 0815   LDLCALC 63 06/17/2020 1053   Lab Results  Component Value Date   HGBA1C 5.1 09/22/2023      Component Value Date/Time   LABOPIA NONE DETECTED 09/22/2023 1848   COCAINSCRNUR NONE DETECTED 09/22/2023 1848   LABBENZ NONE DETECTED 09/22/2023 1848   AMPHETMU NONE DETECTED 09/22/2023 1848   THCU NONE DETECTED 09/22/2023 1848   LABBARB NONE DETECTED 09/22/2023 1848    No results for input(s): "ETH" in the last 168 hours.  I have personally reviewed the radiological images below and agree with the radiology interpretations.  DG Abd 1 View Result Date: 09/24/2023 CLINICAL DATA:  Acute abdominal pain.  Constipation. EXAM: ABDOMEN - 1 VIEW COMPARISON:  Radiographs 09/21/2023 and 03/26/2018. CT virtual colonoscopy 09/07/2023. FINDINGS: Single supine view the abdomen demonstrates a normal nonobstructive bowel gas pattern. Colonic stool burden within physiologic limits, improved from previous study. Right renal calculus noted. No acute osseous findings are seen. IMPRESSION: No evidence of bowel obstruction or other acute process. Right renal calculus. Electronically Signed   By: Carey Bullocks M.D.   On: 09/24/2023 10:17   VAS US CAROTID (at [facility] and [facility] only) Result Date: 09/23/2023 Carotid Arterial Duplex Study Patient  Name:  Christina Orozco  Date of Exam:   09/23/2023 Medical Rec #: [id number]         Accession #:    [id number] Date of Birth: [birth year]        Patient Gender: F Patient Age:   78 years Exam Location:  [facility] Procedure:      VAS US CAROTID Referring Phys: Eston Esters PATEL --------------------------------------------------------------------------------  Indications:       Recurrent CVA and Weakness. Risk Factors:      Hypertension, hyperlipidemia, Diabetes, PAD. Other Factors:     CKD IV, OSA on CPAP. Comparison Study:  Prior normal carotid duplex done 06/21/22 Performing Technologist: Sherren Kerns RVS  Examination Guidelines: A complete  evaluation includes B-mode imaging, spectral Doppler, color Doppler, and power Doppler as needed of all accessible portions of each vessel. Bilateral testing is considered an integral part of a complete examination. Limited examinations for reoccurring indications may be performed as noted.  Right Carotid Findings: +----------+--------+--------+--------+------------------+------------------+           PSV cm/sEDV cm/sStenosisPlaque DescriptionComments           +----------+--------+--------+--------+------------------+------------------+ CCA Prox  79      18                                intimal thickening +----------+--------+--------+--------+------------------+------------------+ CCA Distal55      12                                intimal thickening +----------+--------+--------+--------+------------------+------------------+ ICA Prox  64      18                                                   +----------+--------+--------+--------+------------------+------------------+ ICA Mid   85      23                                                   +----------+--------+--------+--------+------------------+------------------+ ICA Distal129     33                                                   +----------+--------+--------+--------+------------------+------------------+ ECA       61      9                                                    +----------+--------+--------+--------+------------------+------------------+ +----------+--------+-------+--------+-------------------+           PSV cm/sEDV cmsDescribeArm Pressure (mmHG) +----------+--------+-------+--------+-------------------+ [id number]                                        +----------+--------+-------+--------+-------------------+ +---------+--------+--+--------+--+ VertebralPSV cm/s45EDV cm/s11 +---------+--------+--+--------+--+  Left Carotid Findings:  +----------+--------+--------+--------+------------------+------------------+  PSV cm/sEDV cm/sStenosisPlaque DescriptionComments           +----------+--------+--------+--------+------------------+------------------+ CCA Prox  75      18                                intimal thickening +----------+--------+--------+--------+------------------+------------------+ CCA Mid                           calcific                             +----------+--------+--------+--------+------------------+------------------+ CCA Distal63      18                                intimal thickening +----------+--------+--------+--------+------------------+------------------+ ICA Prox  72      19              focal and calcific                   +----------+--------+--------+--------+------------------+------------------+ ICA Mid   99      27              homogeneous                          +----------+--------+--------+--------+------------------+------------------+ ICA Distal99      31              homogeneous                          +----------+--------+--------+--------+------------------+------------------+ ECA       59      17                                                   +----------+--------+--------+--------+------------------+------------------+ +----------+--------+--------+--------+-------------------+           PSV cm/sEDV cm/sDescribeArm Pressure (mmHG) +----------+--------+--------+--------+-------------------+ [id number]                                          +----------+--------+--------+--------+-------------------+ +---------+--------+--+--------+--+ VertebralPSV cm/s60EDV cm/s20 +---------+--------+--+--------+--+   Summary: Right Carotid: The extracranial vessels were near-normal with only minimal wall                thickening or plaque. Left Carotid: The extracranial vessels were near-normal with only minimal wall                thickening or plaque. Vertebrals:  Bilateral vertebral arteries demonstrate antegrade flow. Subclavians: Normal flow hemodynamics were seen in bilateral subclavian              arteries. *See table(s) above for measurements and observations.  Electronically signed by Consuelo Denmark MD on 09/23/2023 at 5:30:47 PM.    Final    VAS US  TRANSCRANIAL DOPPLER W BUBBLES Result Date: 09/23/2023  Transcranial Doppler with Bubble Patient Name:  Christina Orozco  Date of Exam:   09/23/2023 Medical Rec #: [id number]         Accession #:    [id number] Date of Birth: 1971-03-23  Patient Gender: F Patient Age:   37 years Exam Location:  [facility] Procedure:      VAS [name] TRANSCRANIAL DOPPLER W BUBBLES Referring Phys: Scheryl Marten Jeany [place] --------------------------------------------------------------------------------  Indications: Stroke. History: Recurrent stroke, hypertension, hyperlipidemia, diabetes, PVD, CKD 4, seizure, OSA on CPAP. Comparison Study: No prior study Performing Technologist: Sherren Kerns RVS  Examination Guidelines: A complete evaluation includes B-mode imaging, spectral Doppler, color Doppler, and power Doppler as needed of all accessible portions of each vessel. Bilateral testing is considered an integral part of a complete examination. Limited examinations for reoccurring indications may be performed as noted.  Summary:  A vascular evaluation was performed. The right middle cerebral artery was studied. An IV was inserted into the patient's left AC. Verbal informed consent was obtained.  No HITS noted at rest. Spencer Grade 1 noted with Valsalva maneuver (less than 10 HITS), likely clinically insignificant.  *See table(s) above for TCD measurements and observations.  Diagnosing physician: Marvel Plan MD Electronically signed by Marvel Plan MD on 09/23/2023 at 5:30:20 PM.    Final    ECHOCARDIOGRAM COMPLETE Result Date: 09/22/2023    ECHOCARDIOGRAM REPORT   Patient Name:   Christina Orozco Date of Exam:  09/22/2023 Medical Rec #:  [id number]        Height:       62.5 in Accession #:    [id number]       Weight:       131.0 lb Date of Birth:  21-Mar-1971       BSA:          1.607 m Patient Age:    52 years         BP:           154/78 mmHg Patient Gender: F                HR:           74 bpm. Exam Location:  Inpatient Procedure: 2D Echo, Cardiac Doppler and Color Doppler (Both Spectral and Color            Flow Doppler were utilized during procedure). Indications:    Stroke I63.9  History:        Patient has prior history of Echocardiogram examinations, most                 recent 08/23/2022. Stroke, TIA and CKD, stage 4,                 Signs/Symptoms:Shortness of Breath; Risk Factors:Hypertension,                 Diabetes and Sleep Apnea.  Sonographer:    Lucendia Herrlich RCS Referring Phys: [id number] VISHAL R PATEL IMPRESSIONS  1. Left ventricular ejection fraction, by estimation, is 55 to 60%. The left ventricle has normal function. The left ventricle has no regional wall motion abnormalities. There is mild concentric left ventricular hypertrophy. Left ventricular diastolic parameters were normal.  2. Right ventricular systolic function is normal. The right ventricular size is normal. There is normal pulmonary artery systolic pressure. The estimated right ventricular systolic pressure is 19.0 mmHg.  3. A small pericardial effusion is present. The pericardial effusion is circumferential. There is no evidence of cardiac tamponade.  4. The mitral valve is normal in structure. Trivial mitral valve regurgitation. No evidence of mitral stenosis.  5. The aortic valve is tricuspid. Aortic valve regurgitation is not visualized. No aortic stenosis is present.  6. The inferior vena  cava is normal in size with greater than 50% respiratory variability, suggesting right atrial pressure of 3 mmHg. FINDINGS  Left Ventricle: Left ventricular ejection fraction, by estimation, is 55 to 60%. The left ventricle has normal function. The  left ventricle has no regional wall motion abnormalities. The left ventricular internal cavity size was normal in size. There is  mild concentric left ventricular hypertrophy. Left ventricular diastolic parameters were normal. Right Ventricle: The right ventricular size is normal. No increase in right ventricular wall thickness. Right ventricular systolic function is normal. There is normal pulmonary artery systolic pressure. The tricuspid regurgitant velocity is 2.00 m/s, and  with an assumed right atrial pressure of 3 mmHg, the estimated right ventricular systolic pressure is 19.0 mmHg. Left Atrium: Left atrial size was normal in size. Right Atrium: Right atrial size was normal in size. Pericardium: A small pericardial effusion is present. The pericardial effusion is circumferential. There is no evidence of cardiac tamponade. Mitral Valve: The mitral valve is normal in structure. Trivial mitral valve regurgitation. No evidence of mitral valve stenosis. Tricuspid Valve: The tricuspid valve is normal in structure. Tricuspid valve regurgitation is trivial. Aortic Valve: The aortic valve is tricuspid. Aortic valve regurgitation is not visualized. No aortic stenosis is present. Aortic valve peak gradient measures 6.5 mmHg. Pulmonic Valve: The pulmonic valve was grossly normal. Pulmonic valve regurgitation is not visualized. No evidence of pulmonic stenosis. Aorta: The aortic root and ascending aorta are structurally normal, with no evidence of dilitation. Venous: The inferior vena cava is normal in size with greater than 50% respiratory variability, suggesting right atrial pressure of 3 mmHg. IAS/Shunts: No atrial level shunt detected by color flow Doppler.  LEFT VENTRICLE PLAX 2D LVIDd:         4.30 cm   Diastology LVIDs:         2.50 cm   LV e' medial:    8.49 cm/s LV PW:         1.40 cm   LV E/e' medial:  9.2 LV IVS:        1.30 cm   LV e' lateral:   12.80 cm/s LVOT diam:     2.10 cm   LV E/e' lateral: 6.1 LV SV:          57 LV SV Index:   36 LVOT Area:     3.46 cm  RIGHT VENTRICLE             IVC RV S prime:     12.30 cm/s  IVC diam: 1.90 cm TAPSE (M-mode): 1.9 cm LEFT ATRIUM             Index        RIGHT ATRIUM           Index LA diam:        3.00 cm 1.87 cm/m   RA Area:     10.00 cm LA Vol (A2C):   32.1 ml 19.98 ml/m  RA Volume:   19.70 ml  12.26 ml/m LA Vol (A4C):   39.8 ml 24.77 ml/m LA Biplane Vol: 38.5 ml 23.96 ml/m  AORTIC VALVE AV Area (Vmax): 2.30 cm AV Vmax:        127.00 cm/s AV Peak Grad:   6.5 mmHg LVOT Vmax:      84.50 cm/s LVOT Vmean:     55.300 cm/s LVOT VTI:       0.166 m  AORTA Ao Root diam: 3.00 cm Ao Asc diam:  2.80 cm  MITRAL VALVE               TRICUSPID VALVE MV Area (PHT): 4.31 cm    TR Peak grad:   16.0 mmHg MV Decel Time: 176 msec    TR Vmax:        200.00 cm/s MV E velocity: 77.70 cm/s MV A velocity: 91.60 cm/s  SHUNTS MV E/A ratio:  0.85        Systemic VTI:  0.17 m                            Systemic Diam: 2.10 cm Karyl Paget Croitoru MD Electronically signed by Luana Rumple MD Signature Date/Time: 09/22/2023/4:53:23 PM    Final    MR ANGIO HEAD WO CONTRAST Result Date: 09/22/2023 CLINICAL DATA:  Follow-up examination for stroke. EXAM: MRA HEAD WITHOUT CONTRAST TECHNIQUE: Angiographic images of the Circle of Willis were acquired using MRA technique without intravenous contrast. COMPARISON:  Prior MRI from 09/21/2023. FINDINGS: Anterior circulation: Both internal carotid arteries widely patent to the termini without stenosis. A1 segments widely patent. Normal anterior communicating artery complex. Both anterior cerebral arteries widely patent to their distal aspects without stenosis. No M1 stenosis or occlusion. Normal MCA bifurcations. Distal MCA branches well perfused and symmetric. Posterior circulation: Visualized distal V4 segments widely patent. Left vertebral artery dominant. Left PICA patent. Right PICA not seen. Basilar patent without stenosis. Superior cerebellar and posterior  cerebral arteries patent bilaterally. Anatomic variants: As above. Other: No intracranial aneurysm. Small lipoma noted at the right frontal scalp. IMPRESSION: Normal intracranial MRA. Electronically Signed   By: Virgia Griffins M.D.   On: 09/22/2023 02:55   MR Cervical Spine Wo Contrast Result Date: 09/21/2023 CLINICAL DATA:  Initial evaluation for acute neck pain. EXAM: MRI CERVICAL SPINE WITHOUT CONTRAST TECHNIQUE: Multiplanar, multisequence MR imaging of the cervical spine was performed. No intravenous contrast was administered. COMPARISON:  And MRI from 07/05/2015. FINDINGS: Alignment: Straightening with mild smooth reversal of the normal cervical lordosis. No significant listhesis. Vertebrae: Prior ACDF at C5-6 with solid arthrodesis. Vertebral body height maintained without acute or chronic fracture. Atypical hemangioma noted within the T1 vertebral body. No other worrisome osseous lesions. Degenerative reactive endplate change with marrow edema present about the C3-4 interspace. No other abnormal marrow edema. Cord: Normal signal and morphology. Posterior Fossa, vertebral arteries, paraspinal tissues: Changes of severe chronic microvascular ischemic disease noted within the pons. Craniocervical junction within normal limits. Paraspinous soft tissues within normal limits. Normal flow voids seen within the vertebral arteries bilaterally. Disc levels: C2-C3: Unremarkable. C3-C4: Degenerative intervertebral disc space narrowing with diffuse disc osteophyte complex. Broad posterior component flattens and effaces the ventral thecal sac. Mild cord flattening without cord signal changes. Mild to moderate spinal stenosis. Foramina remain patent. C4-C5: Small central disc protrusion indents the ventral thecal sac. No significant spinal stenosis. Mild uncovertebral spurring without significant foraminal encroachment. C5-C6: Prior fusion. No residual spinal stenosis. Foramina appear patent. C6-C7: Mild disc bulge  with uncovertebral spurring. No spinal stenosis. Foramina remain patent. C7-T1: Normal interspace. Minimal right-sided facet hypertrophy. No canal or foraminal stenosis. IMPRESSION: 1. Degenerative disc osteophyte complex at C3-4 with resultant mild to moderate spinal stenosis. Degenerative reactive endplate change with marrow edema at this level could contribute to neck pain. 2. Prior ACDF at C5-6 without residual or recurrent stenosis. 3. Small central disc protrusion at C4-5 without significant stenosis. Electronically Signed   By: Virgia Griffins M.D.   On:  09/21/2023 21:57   MR BRAIN WO CONTRAST Result Date: 09/21/2023 CLINICAL DATA:  Initial evaluation for acute neuro deficit, right-sided symptoms. EXAM: MRI HEAD WITHOUT CONTRAST TECHNIQUE: Multiplanar, multiecho pulse sequences of the brain and surrounding structures were obtained without intravenous contrast. COMPARISON:  Comparison made with prior CT from earlier the same day as well as previous MRI from 04/14/2023 FINDINGS: Brain: Generalized cerebral atrophy, most pronounced about the brainstem, advanced for age. Extensive T2/FLAIR hyperintensity involving the periventricular deep white matter both cerebral hemispheres, with patchy involvement of the deep gray nuclei, brainstem, and cerebellum, most likely related to advanced chronic microvascular ischemic disease. Multiple remote lacunar infarcts present about the bilateral basal ganglia, thalami, and pons. 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 No associated mass effect or convincing associated hemorrhage, although evaluation limited as there is multiple chronic micro hemorrhages at this location. No other evidence for acute or subacute ischemia. Gray-white matter differentiation otherwise maintained. No acute intracranial hemorrhage. Multiple chronic micro hemorrhages noted, most pronounced about the pons and thalami, most characteristic of chronic poorly controlled  hypertension. No mass lesion, midline shift or mass effect. No hydrocephalus or extra-axial fluid collection. Pituitary gland within normal limits. Vascular: Major intracranial vascular flow voids are maintained. Skull and upper cervical spine: Craniocervical junction within normal limits. Bone marrow signal intensity normal. No scalp soft tissue abnormality. Sinuses/Orbits: Prior bilateral ocular lens replacement. Paranasal sinuses are clear. No significant mastoid effusion. Other: None. IMPRESSION: 1. 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 No associated mass effect or hemorrhage. 2. Underlying advanced chronic microvascular ischemic disease, with multiple remote lacunar infarcts about the bilateral basal ganglia, thalami, and pons. 3. Multiple chronic micro hemorrhages, most pronounced about the pons and thalami, most characteristic of chronic poorly controlled hypertension. Electronically Signed   By: Virgia Griffins M.D.   On: 09/21/2023 21:51   CT Head Wo Contrast Result Date: 09/21/2023 CLINICAL DATA:  Right-sided weakness worse than baseline since waking up at 8 a.m. today. EXAM: CT HEAD WITHOUT CONTRAST TECHNIQUE: Contiguous axial images were obtained from the base of the skull through the vertex without intravenous contrast. RADIATION DOSE REDUCTION: This exam was performed according to the departmental dose-optimization program which includes automated exposure control, adjustment of the mA and/or kV according to patient size and/or use of iterative reconstruction technique. COMPARISON:  CT head without contrast 04/14/2023. MRI head 05/28/2023. FINDINGS: Brain: Multiple remote lacunar infarcts are again noted in the thalami bilaterally. Extensive periventricular white matter changes are present bilaterally. No acute infarct, hemorrhage, or mass lesion is present. Moderate confluent white matter hypoattenuation is stable. Basal ganglia are otherwise within normal limits. The insular ribbon is  normal bilaterally. No acute or focal cortical abnormality is present. Extensive white matter changes are present the brainstem and right greater than left cerebellar peduncles, similar the prior exam. No acute infarct or hemorrhage is present within the posterior fossa. Midline structures are within normal limits. Vascular: Atherosclerotic calcifications are present within the cavernous internal carotid arteries bilaterally. No hyperdense vessel is present. Skull: Calvarium is intact. No focal lytic or blastic lesions are present. No significant extracranial soft tissue lesion is present. Sinuses/Orbits: The paranasal sinuses and mastoid air cells are clear. Bilateral lens replacements are noted. Globes and orbits are otherwise unremarkable. IMPRESSION: 1. No acute intracranial abnormality or significant interval change. 2. Multiple remote lacunar infarcts in the thalami bilaterally. 3. Stable extensive periventricular white matter disease bilaterally is stable. This likely reflects the sequela of chronic  microvascular ischemia. Electronically Signed   By: Audree Leas M.D.   On: 09/21/2023 18:18   DG Abdomen 1 View Result Date: 09/21/2023 CLINICAL DATA:  Constipation EXAM: ABDOMEN - 1 VIEW COMPARISON:  None Available. FINDINGS: A moderate volume of stool material is present in the right colon. A moderate to large volume of well-formed stool material is favored to be in the left colon and rectum. IMPRESSION: 1. Imaging features suggestive of constipation. Electronically Signed   By: Reagan Camera M.D.   On: 09/21/2023 16:08   CT VIRTUAL COLONOSCOPY DIAGNOSTIC Result Date: 09/08/2023 CLINICAL DATA:  Occult blood positive stool EXAM: CT VIRTUAL COLONOSCOPY DIAGNOSTIC TECHNIQUE: The patient was given a standard bowel preparation with Gastrografin and barium for fluid and stool tagging respectively. The quality of the bowel preparation is fair. Automated CO2 insufflation of the colon was performed prior to  image acquisition and colonic distention is good. Image post processing was used to generate a 3D endoluminal fly-through projection of the colon and to electronically subtract stool/fluid as appropriate. COMPARISON:  11/21/2021 FINDINGS: VIRTUAL COLONOSCOPY Moderate amount of retained barium within the colon. Tortuous transverse colon and sigmoid colon. No fixed polypoid filling defects or annular constricting lesions. Virtual colonoscopy is not designed to detect diminutive polyps (i.e., less than or equal to 5 mm), the presence or absence of which may not affect clinical management. CT ABDOMEN AND PELVIS WITHOUT CONTRAST Lower chest: No acute findings Hepatobiliary: No focal hepatic abnormality. Gallbladder unremarkable. Pancreas: No focal abnormality or ductal dilatation. Spleen: No focal abnormality.  Normal size. Adrenals/Urinary Tract: Stable 6 mm stone in the lower pole of the right kidney. No ureteral stones or hydronephrosis. Adrenal glands and urinary bladder unremarkable. Stomach/Bowel: Stomach and small bowel decompressed unremarkable. Vascular/Lymphatic: Aortic atherosclerosis. No evidence of aneurysm or adenopathy. Reproductive: Uterus and adnexa unremarkable.  No mass. Other: No free fluid or free air. Musculoskeletal: No acute bony abnormality. IMPRESSION: Tortuous colon. No fixed polypoid filling defects or annular constricting lesions. Aortic atherosclerosis. Right nephrolithiasis.  No hydronephrosis. No acute findings. Electronically Signed   By: Janeece Mechanic M.D.   On: 09/08/2023 00:16     PHYSICAL EXAM  Temp:  [97.5 F (36.4 C)-98.3 F (36.8 C)] 98.2 F (36.8 C) (04/14 1602) Pulse Rate:  [64-80] 72 (04/14 1602) Resp:  [10-23] 16 (04/14 1700) BP: (166-199)/(76-106) 178/102 (04/14 1602) SpO2:  [100 %] 100 % (04/14 1602)  General - Well nourished, well developed, in no apparent distress, on nasal pillow use for OSA.  Ophthalmologic - fundi not visualized due to  noncooperation.  Cardiovascular - Regular rhythm and rate.  Neuro - awake, alert, eyes open, orientated to age, place, time and people. No aphasia, fluent language, following all simple commands. Able to name and repeat. No gaze palsy, tracking bilaterally, visual field full, PERRL. No facial droop. Tongue midline. Bilateral UEs 4/5, no drift. Bilaterally LEs 3/5, no drift. Sensation symmetrical bilaterally, b/l FTN and HTS ataxia, gait not tested.    ASSESSMENT/PLAN [name] is a 53 y.o. female with history of recurrent stroke, hypertension, hyperlipidemia, diabetes, PVD, CKD 4, seizure, OSA on CPAP admitted for right-sided weakness. No TNK given due to outside the window.    Stroke:  left pontine small infarct, etiology not quite clear now given frequently recurrent stroke CT no acute finding MRI small left pontine infarct MRA unremarkable Carotid Doppler unremarkable TCD bubble study Spencer degree 1 with Valsalva, clinically insignificant. 2D Echo EF 55-60% Patient refused cerebral angiogram afraid of  kidney injury in setting of CKD Plan for LP tomorrow to rule out CNS vasculitis LDL 44 HgbA1c 5.1 Hypercoagulable workup pending, protein S activity 49, homocystine 22.5 Autoimmune workup pending, ANA negative, SSA and SSB negative UDS neg Heparin subcu for VTE prophylaxis aspirin 81 mg daily and Brilinta (ticagrelor) 90 mg bid prior to admission, now on aspirin 81 mg daily in preparation for LP tomorrow.  Patient counseled to be compliant with her antithrombotic medications Ongoing aggressive stroke risk factor management Therapy recommendations: Home health PT and OT Disposition: Pending  History of stroke 08/2015 CSF neg, protein 58 07/2017 MRI showed left pontine infarct, EF 65 to 70%, carotid Doppler and MRA negative.  LDL 43, A1c 12.5, patient put on DAPT and Lipitor 40. 08/2017 TEE showed no PFO. Loop recorder placed, no A. fib found 02/27/2019 woke up with complete  vision loss, gradually vision resumed OD, still has better vision OS.  MRI at that time showed 3 mm acute to early subacute infarct in the dorsal midline of the pons negative MRA head and neck. 07/2019 loop recorder explanted 05/01/2020 MRI showed new T2 hyperintense lesion of the right frontal white matter likely reflect an area of late subacute or chronic infarction. 06/19/2020 Subcentimeter likely subacute infarct at the left lateral aspect of the pons.  MRA head unremarkable, carotid Doppler unremarkable, EF 70 to 75%.  LDL 63, A1c 6.2.  Put on aspirin and Brilinta for 1 months and then back to aspirin and the Plavix.  Continue Lipitor 80. 11/10/2021 - MRI showed possible small right cerebral peduncle infarcts.  MRA negative.  Carotid Doppler unremarkable.  EF 60 to 65%, LDL 34, A1c 6.5, discharged with ASA and brilinta 90 twice daily.  Outpatient follow-up with [facility], continued on aspirin and Brilinta 60 twice daily. 12/2021 admitted for right frontal SO and small left pontine infarct, MRA head and neck negative, ESR and CRP negative, LDL 36, A1c 6.3, P2Y12 = 39, continued on aspirin and Brilinta. 08/2022 admitted for left-sided weakness, dizziness and aphasia.  MRI showed left BG punctate infarct.  CTA head and neck unremarkable.  LDL 35, A1c 5.9.  EF 55 to 70%. 10/2022 admitted for double vision, MRI showed right temporal lobe and left CR, left lateral splenium infarcts. 12/2022 ESR and CRP negative, free protein S antigen normal, protein C activity normal, Antithrombin III normal, homocystine level 23.6, antiphospholipid antibody negative 01/2023 hypercoagulable workup showed homocystine 23.7, free protein S antigen 59, antiphospholipid antibodies negative, factor V Leyden mutation negative, Antithrombin III activity normal, ACE negative 04/2023 admitted for decreased vision, diplopia and nausea vomiting.  MRI showed right frontal parietal small infarct, questionable right splenium small infarct.   Continue aspirin Brilinta as well as Lipitor 80.  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, discharged with Keppra 03/2018 CSF showed WBC 30, RBC 0, protein 57, glucose 221, infectious work up neg  Off Keppra 05/2021, so far no recurrence Follow-up with [facility]  Diabetes HgbA1c 5.1 goal < 7.0 Controlled Home meds including Ozempic CBG monitoring SSI DM education and close PCP follow up  Hypertension Stable on the high end Avoid low BP Long term BP goal normotensive  Hyperlipidemia Home meds: Lipitor 80 LDL 44, goal < 70 Now on Lipitor 80 Continue statin at discharge  Other Stroke Risk Factors Obstructive sleep apnea, on CPAP at home  Other Active Problems CKD 4, creatinine 2.84 Mild leukopenia, WBCs 3.8 Depression on Prozac Abdominal pain, management per primary team  Hospital day # 1   Consuelo Denmark, MD PhD Stroke Neurology 09/24/2023 5:30 PM    To contact Stroke Continuity provider, please refer to [email or web address]. After hours, contact General Neurology

## 2023-09-24 NOTE — TOC Initial Note (Addendum)
 Transition of Care [facility]) - Initial/Assessment Note    Patient Details  Name: Christina Orozco MRN: [id number] Date of Birth: 09-08-1970  [facility] CM/SW Contact:    Jonathan Neighbor, RN Phone Number: 09/24/2023, 11:48 AM  Clinical Narrative:                  Pt is from home alone. Her brother checks on her and she has an aide 3 days a week/ 1.5 hours a day through [employer]. She is asking to have her hours increased. CM will send a request to her medicaid.  PCP is through [facility].  Currently [facility] recommended for home. Pt has used [facility] in the past and asked not to use them again. CM will arrange [facility] services prior to d/c home. ToC following.  [id number] accepted for [facility].   Expected Discharge Plan: Home w Home Health Services Barriers to Discharge: Continued Medical Work up   Patient Goals and CMS Choice   CMS Medicare.gov Compare Post Acute Care list provided to:: Patient Choice offered to / list presented to : Patient      Expected Discharge Plan and Services   Discharge Planning Services: CM Consult Post Acute Care Choice: Home Health Living arrangements for the past 2 months: Apartment                           HH Arranged: PT, OT          Prior Living Arrangements/Services Living arrangements for the past 2 months: Apartment Lives with:: Self Patient language and need for interpreter reviewed:: Yes Do you feel safe going back to the place where you live?: Yes        Care giver support system in place?: No (comment) Current home services: DME (rollator/ BSC/ CPAP/ shower seat) Criminal Activity/Legal Involvement Pertinent to Current Situation/Hospitalization: No - Comment as needed  Activities of Daily Living   ADL Screening (condition at time of admission) Independently performs ADLs?: No Does the patient have a NEW difficulty with bathing/dressing/toileting/self-feeding that is expected to last >3 days?: No Does the patient  have a NEW difficulty with getting in/out of bed, walking, or climbing stairs that is expected to last >3 days?: No Does the patient have a NEW difficulty with communication that is expected to last >3 days?: No Is the patient deaf or have difficulty hearing?: Yes (Pt has tinitis) Does the patient have difficulty seeing, even when wearing glasses/contacts?: No Does the patient have difficulty concentrating, remembering, or making decisions?: No  Permission Sought/Granted                  Emotional Assessment Appearance:: Appears stated age Attitude/Demeanor/Rapport: Engaged Affect (typically observed): Accepting Orientation: : Oriented to Self, Oriented to Place, Oriented to  Time, Oriented to Situation   Psych Involvement: No (comment)  Admission diagnosis:  Left pontine stroke (HCC) [I63.9] Cerebral infarction, unspecified mechanism (HCC) [I63.9] Patient Active Problem List   Diagnosis Date Noted   Left pontine stroke (HCC) 09/21/2023   Fecal occult blood test positive 04/23/2023   Stroke (cerebrum) (HCC) 04/17/2023   Acute ischemic stroke (HCC) 04/14/2023   Prolonged QT interval 04/14/2023   Diplopia 10/31/2022   OSA (obstructive sleep apnea) 08/25/2022   Subcortical infarction (HCC) 08/23/2022   Overweight (BMI 25.0-29.9) 08/23/2022   Asthma, chronic 08/22/2022   TIA (transient ischemic attack) 06/20/2022   Numbness and tingling in left hand 03/02/2022   Gait  disturbance, post-stroke 03/02/2022   AKI (acute kidney injury) (HCC) 12/23/2021   Acute embolic stroke (HCC) 12/23/2021   Hypernatremia 12/20/2021   CKD (chronic kidney disease), stage IV (HCC) 12/19/2021   Left-sided weakness and visual deficits  12/19/2021   Vision abnormalities 12/19/2021   Acute stroke due to ischemia [facility]) 11/10/2021   Depression 07/05/2020   CVA (cerebral vascular accident) (HCC) 06/21/2020   Generalized anxiety disorder 03/01/2019   Abnormality of gait 10/14/2018   Dizziness and  giddiness 09/13/2018   Generalized weakness 06/26/2018   Chronic nonintractable headache 04/26/2018   Diastolic dysfunction    Congestion of nasal sinus    Pneumonia of left lower lobe due to infectious organism    SOB (shortness of breath)    Candidiasis    History of anemia due to chronic kidney disease    Acute on chronic anemia    Hypoalbuminemia due to protein-calorie malnutrition (HCC)    Hypertension associated with diabetes (HCC)    Type 2 diabetes mellitus with chronic kidney disease, without long-term current use of insulin (HCC)    PRES (posterior reversible encephalopathy syndrome)    Hypokalemia    History of CVA with residual deficit    History of seizure 03/25/2018   Bilateral carpal tunnel syndrome 02/14/2018   Arthritis 10/31/2017   Asthma 10/31/2017   Constipation 10/31/2017   Kidney stone 10/31/2017   Anemia, chronic disease 08/09/2017   Hyperlipidemia associated with type 2 diabetes mellitus (HCC) 07/26/2017   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HCC) 07/25/2017   Anxiety as acute reaction to exceptional stress 04/23/2017   Insomnia 10/08/2016   Neuropathy 06/07/2016   Gait instability    Lesion of pons    Slurred speech    Vertigo 04/30/2016   Hyperglycemia 04/30/2016   Chronic kidney disease (CKD), stage III (moderate) (HCC) 04/30/2016   Ataxia 04/30/2016   Chronic right shoulder pain 04/03/2016   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 [place] [address] Phone: [phone] Fax: [phone]  [facility] [address] [place] [address] Phone: [phone] Fax:  [phone]     Social Drivers of Health (SDOH) Social History: SDOH Screenings   Food Insecurity: No Food Insecurity (09/22/2023)  Housing: Low Risk  (09/22/2023)  Transportation Needs: No Transportation Needs (09/22/2023)  Utilities: Not At Risk (09/22/2023)  Depression (PHQ2-9): Low Risk  (05/08/2023)  Financial Resource Strain: Medium Risk (09/06/2022)   Received from [facility], [facility]  Social Connections: Unknown (10/10/2021)   Received from [facility], [facility]  Tobacco Use: Low Risk  (09/21/2023)   SDOH Interventions:     Readmission Risk Interventions     No data to display

## 2023-09-25 DIAGNOSIS — I639 Cerebral infarction, unspecified: Secondary | ICD-10-CM | POA: Diagnosis not present

## 2023-09-25 DIAGNOSIS — I6389 Other cerebral infarction: Secondary | ICD-10-CM | POA: Diagnosis not present

## 2023-09-25 LAB — PROTEIN AND GLUCOSE, CSF
Glucose, CSF: 68 mg/dL (ref 40–70)
Total  Protein, CSF: 63 mg/dL — ABNORMAL HIGH (ref 15–45)

## 2023-09-25 LAB — BASIC METABOLIC PANEL WITH GFR
Anion gap: 9 (ref 5–15)
BUN: 32 mg/dL — ABNORMAL HIGH (ref 6–20)
CO2: 25 mmol/L (ref 22–32)
Calcium: 9.3 mg/dL (ref 8.9–10.3)
Chloride: 105 mmol/L (ref 98–111)
Creatinine, Ser: 2.89 mg/dL — ABNORMAL HIGH (ref 0.44–1.00)
GFR, Estimated: 19 mL/min — ABNORMAL LOW (ref >60)
Glucose, Bld: 99 mg/dL (ref 70–99)
Potassium: 3.8 mmol/L (ref 3.5–5.1)
Sodium: 139 mmol/L (ref 135–145)

## 2023-09-25 LAB — CSF CELL COUNT WITH DIFFERENTIAL
RBC Count, CSF: 43 /mm3 — ABNORMAL HIGH
RBC Count, CSF: 6 /mm3 — ABNORMAL HIGH
Tube #: 1
Tube #: 4
WBC, CSF: 1 /mm3 (ref 0–5)
WBC, CSF: 1 /mm3 (ref 0–5)

## 2023-09-25 LAB — GLUCOSE, CAPILLARY
Glucose-Capillary: 108 mg/dL — ABNORMAL HIGH (ref 70–99)
Glucose-Capillary: 118 mg/dL — ABNORMAL HIGH (ref 70–99)
Glucose-Capillary: 99 mg/dL (ref 70–99)
Glucose-Capillary: 93 mg/dL (ref 70–99)

## 2023-09-25 LAB — CRYPTOCOCCAL ANTIGEN, CSF: Crypto Ag: NEGATIVE

## 2023-09-25 LAB — ANCA TITERS
Atypical P-ANCA titer: <1:20 {titer}
C-ANCA: <1:20 {titer}
P-ANCA: <1:20 {titer}

## 2023-09-25 LAB — BETA-2-GLYCOPROTEIN I ABS, IGG/M/A
Beta-2 Glyco I IgG: <9 GPI IgG units (ref 0–20)
Beta-2-Glycoprotein I IgA: <9 GPI IgA units (ref 0–25)
Beta-2-Glycoprotein I IgM: <9 GPI IgM units (ref 0–32)

## 2023-09-25 MED ORDER — HYDROCODONE-ACETAMINOPHEN 5-325 MG PO TABS
1.0000 | ORAL_TABLET | Freq: Four times a day (QID) | ORAL | Status: DC | PRN
  Administered 2023-09-25: 1 via ORAL
  Filled 2023-09-25: qty 1

## 2023-09-25 MED ORDER — AMLODIPINE BESYLATE 5 MG PO TABS
5.0000 mg | ORAL_TABLET | Freq: Every day | ORAL | Status: DC
  Administered 2023-09-25: 5 mg via ORAL
  Filled 2023-09-25: qty 1

## 2023-09-25 NOTE — Progress Notes (Signed)
 STROKE TEAM PROGRESS NOTE   SUBJECTIVE (INTERVAL HISTORY) No family is at the bedside. Pt lying in bed, no specific complains today. Her abdominal pain has resolved after yesterday BM. Will do LP today.    OBJECTIVE Temp:  [97.5 F (36.4 C)-98.4 F (36.9 C)] 98.4 F (36.9 C) (04/15 1507) Pulse Rate:  [75-87] 80 (04/15 1507) Cardiac Rhythm: Normal sinus rhythm (04/15 0800) Resp:  [14-24] 14 (04/15 1507) BP: (121-180)/(79-118) 150/79 (04/15 1507) SpO2:  [100 %] 100 % (04/15 1507)  Recent Labs  Lab 09/24/23 1654 09/24/23 2113 09/25/23 0613 09/25/23 1106 09/25/23 1656  GLUCAP 119* 103* 108* 118* 99   Recent Labs  Lab 09/21/23 1421 09/22/23 0815 09/23/23 1150 09/24/23 0613 09/24/23 1128 09/25/23 0548  NA 142 144 143 143  --  139  K 4.3 3.8 4.3 4.2  --  3.8  CL 109 112* 112* 105  --  105  CO2 21* 22 21* 28  --  25  GLUCOSE 97 102* 101* 103*  --  99  BUN 36* 34* 30* 30*  --  32*  CREATININE 2.84* 2.94* 2.89* 2.81*  --  2.89*  CALCIUM 9.3 9.3 9.6 9.6  --  9.3  MG  --   --   --   --  2.1  --    Recent Labs  Lab 09/21/23 1421  AST 33  ALT 35  ALKPHOS 43  BILITOT 0.7  PROT 7.3  ALBUMIN 4.0   Recent Labs  Lab 09/21/23 1421 09/22/23 0815  WBC 3.8* 3.9*  NEUTROABS 1.8  --   HGB 8.8* 8.1*  HCT 27.1* 24.5*  MCV 90.9 90.7  PLT 202 201        Component Value Date/Time   CHOL 128 09/22/2023 0815   CHOL 135 06/17/2020 1053   TRIG 59 09/22/2023 0815   HDL 72 09/22/2023 0815   HDL 47 06/17/2020 1053   CHOLHDL 1.8 09/22/2023 0815   VLDL 12 09/22/2023 0815   LDLCALC 44 09/22/2023 0815   LDLCALC 63 06/17/2020 1053   Lab Results  Component Value Date   HGBA1C 5.1 09/22/2023      Component Value Date/Time   LABOPIA NONE DETECTED 09/22/2023 1848   COCAINSCRNUR NONE DETECTED 09/22/2023 1848   LABBENZ NONE DETECTED 09/22/2023 1848   AMPHETMU NONE DETECTED 09/22/2023 1848   THCU NONE DETECTED 09/22/2023 1848   LABBARB NONE DETECTED 09/22/2023 1848     I  have personally reviewed the radiological images below and agree with the radiology interpretations.  DG Abd 1 View Result Date: 09/24/2023 CLINICAL DATA:  Acute abdominal pain.  Constipation. EXAM: ABDOMEN - 1 VIEW COMPARISON:  Radiographs 09/21/2023 and 03/26/2018. CT virtual colonoscopy 09/07/2023. FINDINGS: Single supine view the abdomen demonstrates a normal nonobstructive bowel gas pattern. Colonic stool burden within physiologic limits, improved from previous study. Right renal calculus noted. No acute osseous findings are seen. IMPRESSION: No evidence of bowel obstruction or other acute process. Right renal calculus. Electronically Signed   By: Carey Bullocks M.D.   On: 09/24/2023 10:17   VAS US CAROTID (at [facility] and [facility] only) Result Date: 09/23/2023 Carotid Arterial Duplex Study Patient Name:  Christina Orozco  Date of Exam:   09/23/2023 Medical Rec #: [id number]         Accession #:    [id number] Date of Birth: 1971/02/04        Patient Gender: F Patient Age:   53 years Exam Location:  [facility] Procedure:  VAS US CAROTID Referring Phys: VISHAL PATEL --------------------------------------------------------------------------------  Indications:       Recurrent CVA and Weakness. Risk Factors:      Hypertension, hyperlipidemia, Diabetes, PAD. Other Factors:     CKD IV, OSA on CPAP. Comparison Study:  Prior normal carotid duplex done 06/21/22 Performing Technologist: Sherren Kerns RVS  Examination Guidelines: A complete evaluation includes B-mode imaging, spectral Doppler, color Doppler, and power Doppler as needed of all accessible portions of each vessel. Bilateral testing is considered an integral part of a complete examination. Limited examinations for reoccurring indications may be performed as noted.  Right Carotid Findings: +----------+--------+--------+--------+------------------+------------------+           PSV cm/sEDV cm/sStenosisPlaque DescriptionComments            +----------+--------+--------+--------+------------------+------------------+ CCA Prox  79      18                                intimal thickening +----------+--------+--------+--------+------------------+------------------+ CCA Distal55      12                                intimal thickening +----------+--------+--------+--------+------------------+------------------+ ICA Prox  64      18                                                   +----------+--------+--------+--------+------------------+------------------+ ICA Mid   85      23                                                   +----------+--------+--------+--------+------------------+------------------+ ICA Distal129     33                                                   +----------+--------+--------+--------+------------------+------------------+ ECA       61      9                                                    +----------+--------+--------+--------+------------------+------------------+ +----------+--------+-------+--------+-------------------+           PSV cm/sEDV cmsDescribeArm Pressure (mmHG) +----------+--------+-------+--------+-------------------+ [id number]                                        +----------+--------+-------+--------+-------------------+ +---------+--------+--+--------+--+ VertebralPSV cm/s45EDV cm/s11 +---------+--------+--+--------+--+  Left Carotid Findings: +----------+--------+--------+--------+------------------+------------------+           PSV cm/sEDV cm/sStenosisPlaque DescriptionComments           +----------+--------+--------+--------+------------------+------------------+ CCA Prox  75      18  intimal thickening +----------+--------+--------+--------+------------------+------------------+ CCA Mid                           calcific                              +----------+--------+--------+--------+------------------+------------------+ CCA Distal63      18                                intimal thickening +----------+--------+--------+--------+------------------+------------------+ ICA Prox  72      19              focal and calcific                   +----------+--------+--------+--------+------------------+------------------+ ICA Mid   99      27              homogeneous                          +----------+--------+--------+--------+------------------+------------------+ ICA Distal99      31              homogeneous                          +----------+--------+--------+--------+------------------+------------------+ ECA       59      17                                                   +----------+--------+--------+--------+------------------+------------------+ +----------+--------+--------+--------+-------------------+           PSV cm/sEDV cm/sDescribeArm Pressure (mmHG) +----------+--------+--------+--------+-------------------+ [id number]                                          +----------+--------+--------+--------+-------------------+ +---------+--------+--+--------+--+ VertebralPSV cm/s60EDV cm/s20 +---------+--------+--+--------+--+   Summary: Right Carotid: The extracranial vessels were near-normal with only minimal wall                thickening or plaque. Left Carotid: The extracranial vessels were near-normal with only minimal wall               thickening or plaque. Vertebrals:  Bilateral vertebral arteries demonstrate antegrade flow. Subclavians: Normal flow hemodynamics were seen in bilateral subclavian              arteries. *See table(s) above for measurements and observations.  Electronically signed by Marvel Plan MD on 09/23/2023 at 5:30:47 PM.    Final    VAS [name] TRANSCRANIAL DOPPLER W BUBBLES Result Date: 09/23/2023  Transcranial Doppler with Bubble Patient Name:  Christina Orozco  Date of  Exam:   09/23/2023 Medical Rec #: [id number]         Accession #:    [id number] Date of Birth: 06/14/1970        Patient Gender: F Patient Age:   49 years Exam Location:  [facility] Procedure:      VAS [name] TRANSCRANIAL DOPPLER W BUBBLES Referring Phys: Scheryl Marten Cassius Cullinane --------------------------------------------------------------------------------  Indications: Stroke. History: Recurrent stroke, hypertension, hyperlipidemia, diabetes, PVD, CKD 4, seizure, OSA on CPAP. Comparison  Study: No prior study Performing Technologist: Carleene Chase RVS  Examination Guidelines: A complete evaluation includes B-mode imaging, spectral Doppler, color Doppler, and power Doppler as needed of all accessible portions of each vessel. Bilateral testing is considered an integral part of a complete examination. Limited examinations for reoccurring indications may be performed as noted.  Summary:  A vascular evaluation was performed. The right middle cerebral artery was studied. An IV was inserted into the patient's left AC. Verbal informed consent was obtained.  No HITS noted at rest. Spencer Grade 1 noted with Valsalva maneuver (less than 10 HITS), likely clinically insignificant.  *See table(s) above for TCD measurements and observations.  Diagnosing physician: Consuelo Denmark MD Electronically signed by Consuelo Denmark MD on 09/23/2023 at 5:30:20 PM.    Final    ECHOCARDIOGRAM COMPLETE Result Date: 09/22/2023    ECHOCARDIOGRAM REPORT   Patient Name:   Christina Orozco Date of Exam: 09/22/2023 Medical Rec #:  [id number]        Height:       62.5 in Accession #:    [id number]       Weight:       131.0 lb Date of Birth:  29-Jan-1971       BSA:          1.607 m Patient Age:    52 years         BP:           154/78 mmHg Patient Gender: F                HR:           74 bpm. Exam Location:  Inpatient Procedure: 2D Echo, Cardiac Doppler and Color Doppler (Both Spectral and Color            Flow Doppler were utilized during procedure). Indications:     Stroke I63.9  History:        Patient has prior history of Echocardiogram examinations, most                 recent 08/23/2022. Stroke, TIA and CKD, stage 4,                 Signs/Symptoms:Shortness of Breath; Risk Factors:Hypertension,                 Diabetes and Sleep Apnea.  Sonographer:    Terrilee Few RCS Referring Phys: [id number] VISHAL R PATEL IMPRESSIONS  1. Left ventricular ejection fraction, by estimation, is 55 to 60%. The left ventricle has normal function. The left ventricle has no regional wall motion abnormalities. There is mild concentric left ventricular hypertrophy. Left ventricular diastolic parameters were normal.  2. Right ventricular systolic function is normal. The right ventricular size is normal. There is normal pulmonary artery systolic pressure. The estimated right ventricular systolic pressure is 19.0 mmHg.  3. A small pericardial effusion is present. The pericardial effusion is circumferential. There is no evidence of cardiac tamponade.  4. The mitral valve is normal in structure. Trivial mitral valve regurgitation. No evidence of mitral stenosis.  5. The aortic valve is tricuspid. Aortic valve regurgitation is not visualized. No aortic stenosis is present.  6. The inferior vena cava is normal in size with greater than 50% respiratory variability, suggesting right atrial pressure of 3 mmHg. FINDINGS  Left Ventricle: Left ventricular ejection fraction, by estimation, is 55 to 60%. The left ventricle has normal function. The left ventricle has no regional wall motion abnormalities. The left  ventricular internal cavity size was normal in size. There is  mild concentric left ventricular hypertrophy. Left ventricular diastolic parameters were normal. Right Ventricle: The right ventricular size is normal. No increase in right ventricular wall thickness. Right ventricular systolic function is normal. There is normal pulmonary artery systolic pressure. The tricuspid regurgitant velocity is  2.00 m/s, and  with an assumed right atrial pressure of 3 mmHg, the estimated right ventricular systolic pressure is 19.0 mmHg. Left Atrium: Left atrial size was normal in size. Right Atrium: Right atrial size was normal in size. Pericardium: A small pericardial effusion is present. The pericardial effusion is circumferential. There is no evidence of cardiac tamponade. Mitral Valve: The mitral valve is normal in structure. Trivial mitral valve regurgitation. No evidence of mitral valve stenosis. Tricuspid Valve: The tricuspid valve is normal in structure. Tricuspid valve regurgitation is trivial. Aortic Valve: The aortic valve is tricuspid. Aortic valve regurgitation is not visualized. No aortic stenosis is present. Aortic valve peak gradient measures 6.5 mmHg. Pulmonic Valve: The pulmonic valve was grossly normal. Pulmonic valve regurgitation is not visualized. No evidence of pulmonic stenosis. Aorta: The aortic root and ascending aorta are structurally normal, with no evidence of dilitation. Venous: The inferior vena cava is normal in size with greater than 50% respiratory variability, suggesting right atrial pressure of 3 mmHg. IAS/Shunts: No atrial level shunt detected by color flow Doppler.  LEFT VENTRICLE PLAX 2D LVIDd:         4.30 cm   Diastology LVIDs:         2.50 cm   LV e' medial:    8.49 cm/s LV PW:         1.40 cm   LV E/e' medial:  9.2 LV IVS:        1.30 cm   LV e' lateral:   12.80 cm/s LVOT diam:     2.10 cm   LV E/e' lateral: 6.1 LV SV:         57 LV SV Index:   36 LVOT Area:     3.46 cm  RIGHT VENTRICLE             IVC RV S prime:     12.30 cm/s  IVC diam: 1.90 cm TAPSE (M-mode): 1.9 cm LEFT ATRIUM             Index        RIGHT ATRIUM           Index LA diam:        3.00 cm 1.87 cm/m   RA Area:     10.00 cm LA Vol (A2C):   32.1 ml 19.98 ml/m  RA Volume:   19.70 ml  12.26 ml/m LA Vol (A4C):   39.8 ml 24.77 ml/m LA Biplane Vol: 38.5 ml 23.96 ml/m  AORTIC VALVE AV Area (Vmax): 2.30 cm AV  Vmax:        127.00 cm/s AV Peak Grad:   6.5 mmHg LVOT Vmax:      84.50 cm/s LVOT Vmean:     55.300 cm/s LVOT VTI:       0.166 m  AORTA Ao Root diam: 3.00 cm Ao Asc diam:  2.80 cm MITRAL VALVE               TRICUSPID VALVE MV Area (PHT): 4.31 cm    TR Peak grad:   16.0 mmHg MV Decel Time: 176 msec    TR Vmax:  200.00 cm/s MV E velocity: 77.70 cm/s MV A velocity: 91.60 cm/s  SHUNTS MV E/A ratio:  0.85        Systemic VTI:  0.17 m                            Systemic Diam: 2.10 cm Karyl Paget Croitoru MD Electronically signed by Luana Rumple MD Signature Date/Time: 09/22/2023/4:53:23 PM    Final    MR ANGIO HEAD WO CONTRAST Result Date: 09/22/2023 CLINICAL DATA:  Follow-up examination for stroke. EXAM: MRA HEAD WITHOUT CONTRAST TECHNIQUE: Angiographic images of the Circle of Willis were acquired using MRA technique without intravenous contrast. COMPARISON:  Prior MRI from 09/21/2023. FINDINGS: Anterior circulation: Both internal carotid arteries widely patent to the termini without stenosis. A1 segments widely patent. Normal anterior communicating artery complex. Both anterior cerebral arteries widely patent to their distal aspects without stenosis. No M1 stenosis or occlusion. Normal MCA bifurcations. Distal MCA branches well perfused and symmetric. Posterior circulation: Visualized distal V4 segments widely patent. Left vertebral artery dominant. Left PICA patent. Right PICA not seen. Basilar patent without stenosis. Superior cerebellar and posterior cerebral arteries patent bilaterally. Anatomic variants: As above. Other: No intracranial aneurysm. Small lipoma noted at the right frontal scalp. IMPRESSION: Normal intracranial MRA. Electronically Signed   By: Virgia Griffins M.D.   On: 09/22/2023 02:55   MR Cervical Spine Wo Contrast Result Date: 09/21/2023 CLINICAL DATA:  Initial evaluation for acute neck pain. EXAM: MRI CERVICAL SPINE WITHOUT CONTRAST TECHNIQUE: Multiplanar, multisequence MR imaging of  the cervical spine was performed. No intravenous contrast was administered. COMPARISON:  And MRI from 07/05/2015. FINDINGS: Alignment: Straightening with mild smooth reversal of the normal cervical lordosis. No significant listhesis. Vertebrae: Prior ACDF at C5-6 with solid arthrodesis. Vertebral body height maintained without acute or chronic fracture. Atypical hemangioma noted within the T1 vertebral body. No other worrisome osseous lesions. Degenerative reactive endplate change with marrow edema present about the C3-4 interspace. No other abnormal marrow edema. Cord: Normal signal and morphology. Posterior Fossa, vertebral arteries, paraspinal tissues: Changes of severe chronic microvascular ischemic disease noted within the pons. Craniocervical junction within normal limits. Paraspinous soft tissues within normal limits. Normal flow voids seen within the vertebral arteries bilaterally. Disc levels: C2-C3: Unremarkable. C3-C4: Degenerative intervertebral disc space narrowing with diffuse disc osteophyte complex. Broad posterior component flattens and effaces the ventral thecal sac. Mild cord flattening without cord signal changes. Mild to moderate spinal stenosis. Foramina remain patent. C4-C5: Small central disc protrusion indents the ventral thecal sac. No significant spinal stenosis. Mild uncovertebral spurring without significant foraminal encroachment. C5-C6: Prior fusion. No residual spinal stenosis. Foramina appear patent. C6-C7: Mild disc bulge with uncovertebral spurring. No spinal stenosis. Foramina remain patent. C7-T1: Normal interspace. Minimal right-sided facet hypertrophy. No canal or foraminal stenosis. IMPRESSION: 1. Degenerative disc osteophyte complex at C3-4 with resultant mild to moderate spinal stenosis. Degenerative reactive endplate change with marrow edema at this level could contribute to neck pain. 2. Prior ACDF at C5-6 without residual or recurrent stenosis. 3. Small central disc  protrusion at C4-5 without significant stenosis. Electronically Signed   By: Virgia Griffins M.D.   On: 09/21/2023 21:57   MR BRAIN WO CONTRAST Result Date: 09/21/2023 CLINICAL DATA:  Initial evaluation for acute neuro deficit, right-sided symptoms. EXAM: MRI HEAD WITHOUT CONTRAST TECHNIQUE: Multiplanar, multiecho pulse sequences of the brain and surrounding structures were obtained without intravenous contrast. COMPARISON:  Comparison made with prior CT  from earlier the same day as well as previous MRI from 04/14/2023 FINDINGS: Brain: Generalized cerebral atrophy, most pronounced about the brainstem, advanced for age. Extensive T2/FLAIR hyperintensity involving the periventricular deep white matter both cerebral hemispheres, with patchy involvement of the deep gray nuclei, brainstem, and cerebellum, most likely related to advanced chronic microvascular ischemic disease. Multiple remote lacunar infarcts present about the bilateral basal ganglia, thalami, and pons. 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 No associated mass effect or convincing associated hemorrhage, although evaluation limited as there is multiple chronic micro hemorrhages at this location. No other evidence for acute or subacute ischemia. Gray-white matter differentiation otherwise maintained. No acute intracranial hemorrhage. Multiple chronic micro hemorrhages noted, most pronounced about the pons and thalami, most characteristic of chronic poorly controlled hypertension. No mass lesion, midline shift or mass effect. No hydrocephalus or extra-axial fluid collection. Pituitary gland within normal limits. Vascular: Major intracranial vascular flow voids are maintained. Skull and upper cervical spine: Craniocervical junction within normal limits. Bone marrow signal intensity normal. No scalp soft tissue abnormality. Sinuses/Orbits: Prior bilateral ocular lens replacement. Paranasal sinuses are clear. No significant  mastoid effusion. Other: None. IMPRESSION: 1. 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 No associated mass effect or hemorrhage. 2. Underlying advanced chronic microvascular ischemic disease, with multiple remote lacunar infarcts about the bilateral basal ganglia, thalami, and pons. 3. Multiple chronic micro hemorrhages, most pronounced about the pons and thalami, most characteristic of chronic poorly controlled hypertension. Electronically Signed   By: Rise Mu M.D.   On: 09/21/2023 21:51   CT Head Wo Contrast Result Date: 09/21/2023 CLINICAL DATA:  Right-sided weakness worse than baseline since waking up at 8 a.m. today. EXAM: CT HEAD WITHOUT CONTRAST TECHNIQUE: Contiguous axial images were obtained from the base of the skull through the vertex without intravenous contrast. RADIATION DOSE REDUCTION: This exam was performed according to the departmental dose-optimization program which includes automated exposure control, adjustment of the mA and/or kV according to patient size and/or use of iterative reconstruction technique. COMPARISON:  CT head without contrast 04/14/2023. MRI head 05/28/2023. FINDINGS: Brain: Multiple remote lacunar infarcts are again noted in the thalami bilaterally. Extensive periventricular white matter changes are present bilaterally. No acute infarct, hemorrhage, or mass lesion is present. Moderate confluent white matter hypoattenuation is stable. Basal ganglia are otherwise within normal limits. The insular ribbon is normal bilaterally. No acute or focal cortical abnormality is present. Extensive white matter changes are present the brainstem and right greater than left cerebellar peduncles, similar the prior exam. No acute infarct or hemorrhage is present within the posterior fossa. Midline structures are within normal limits. Vascular: Atherosclerotic calcifications are present within the cavernous internal carotid arteries bilaterally. No hyperdense vessel is present.  Skull: Calvarium is intact. No focal lytic or blastic lesions are present. No significant extracranial soft tissue lesion is present. Sinuses/Orbits: The paranasal sinuses and mastoid air cells are clear. Bilateral lens replacements are noted. Globes and orbits are otherwise unremarkable. IMPRESSION: 1. No acute intracranial abnormality or significant interval change. 2. Multiple remote lacunar infarcts in the thalami bilaterally. 3. Stable extensive periventricular white matter disease bilaterally is stable. This likely reflects the sequela of chronic microvascular ischemia. Electronically Signed   By: Marin Roberts M.D.   On: 09/21/2023 18:18   DG Abdomen 1 View Result Date: 09/21/2023 CLINICAL DATA:  Constipation EXAM: ABDOMEN - 1 VIEW COMPARISON:  None Available. FINDINGS: A moderate volume of stool material is present in the right  colon. A moderate to large volume of well-formed stool material is favored to be in the left colon and rectum. IMPRESSION: 1. Imaging features suggestive of constipation. Electronically Signed   By: Lowell Guitar M.D.   On: 09/21/2023 16:08   CT VIRTUAL COLONOSCOPY DIAGNOSTIC Result Date: 09/08/2023 CLINICAL DATA:  Occult blood positive stool EXAM: CT VIRTUAL COLONOSCOPY DIAGNOSTIC TECHNIQUE: The patient was given a standard bowel preparation with Gastrografin and barium for fluid and stool tagging respectively. The quality of the bowel preparation is fair. Automated CO2 insufflation of the colon was performed prior to image acquisition and colonic distention is good. Image post processing was used to generate a 3D endoluminal fly-through projection of the colon and to electronically subtract stool/fluid as appropriate. COMPARISON:  11/21/2021 FINDINGS: VIRTUAL COLONOSCOPY Moderate amount of retained barium within the colon. Tortuous transverse colon and sigmoid colon. No fixed polypoid filling defects or annular constricting lesions. Virtual colonoscopy is not designed to  detect diminutive polyps (i.e., less than or equal to 5 mm), the presence or absence of which may not affect clinical management. CT ABDOMEN AND PELVIS WITHOUT CONTRAST Lower chest: No acute findings Hepatobiliary: No focal hepatic abnormality. Gallbladder unremarkable. Pancreas: No focal abnormality or ductal dilatation. Spleen: No focal abnormality.  Normal size. Adrenals/Urinary Tract: Stable 6 mm stone in the lower pole of the right kidney. No ureteral stones or hydronephrosis. Adrenal glands and urinary bladder unremarkable. Stomach/Bowel: Stomach and small bowel decompressed unremarkable. Vascular/Lymphatic: Aortic atherosclerosis. No evidence of aneurysm or adenopathy. Reproductive: Uterus and adnexa unremarkable.  No mass. Other: No free fluid or free air. Musculoskeletal: No acute bony abnormality. IMPRESSION: Tortuous colon. No fixed polypoid filling defects or annular constricting lesions. Aortic atherosclerosis. Right nephrolithiasis.  No hydronephrosis. No acute findings. Electronically Signed   By: Charlett Nose M.D.   On: 09/08/2023 00:16     PHYSICAL EXAM  Temp:  [97.5 F (36.4 C)-98.4 F (36.9 C)] 98.4 F (36.9 C) (04/15 1507) Pulse Rate:  [75-87] 80 (04/15 1507) Resp:  [14-24] 14 (04/15 1507) BP: (121-180)/(79-118) 150/79 (04/15 1507) SpO2:  [100 %] 100 % (04/15 1507)  General - Well nourished, well developed, in no apparent distress, on nasal pillow use for OSA.  Ophthalmologic - fundi not visualized due to noncooperation.  Cardiovascular - Regular rhythm and rate.  Neuro - awake, alert, eyes open, orientated to age, place, time and people. No aphasia, fluent language, following all simple commands. Able to name and repeat. No gaze palsy, tracking bilaterally, visual field full, PERRL. No facial droop. Tongue midline. Bilateral UEs 4/5, no drift. Bilaterally LEs 3/5, no drift. Sensation symmetrical bilaterally, b/l FTN and HTS ataxia, gait not tested.    ASSESSMENT/PLAN Ms.  Christina Orozco is a 53 y.o. female with history of recurrent stroke, hypertension, hyperlipidemia, diabetes, PVD, CKD 4, seizure, OSA on CPAP admitted for right-sided weakness. No TNK given due to outside the window.    Stroke:  left pontine small infarct, etiology not quite clear now given frequently recurrent stroke CT no acute finding MRI small left pontine infarct MRA unremarkable Carotid Doppler unremarkable TCD bubble study Spencer degree 1 with Valsalva, clinically insignificant. 2D Echo EF 55-60% Patient refused cerebral angiogram afraid of kidney injury in setting of CKD Plan for LP today to rule out CNS vasculitis LDL 44 HgbA1c 5.1 Hypercoagulable workup pending, protein S activity 49, homocystine 22.5 Autoimmune workup neg, ANA negative, SSA and SSB negative UDS neg Heparin subcu for VTE prophylaxis aspirin 81 mg daily and Brilinta (  ticagrelor) 90 mg bid prior to admission, now on aspirin 81 mg daily in preparation for LP. My resume brilinta tomorrow.  Patient counseled to be compliant with her antithrombotic medications Ongoing aggressive stroke risk factor management Therapy recommendations: Home health PT and OT Disposition: Pending  History of stroke 08/2015 CSF neg, protein 58 07/2017 MRI showed left pontine infarct, EF 65 to 70%, carotid Doppler and MRA negative.  LDL 43, A1c 12.5, patient put on DAPT and Lipitor 40. 08/2017 TEE showed no PFO. Loop recorder placed, no A. fib found 02/27/2019 woke up with complete vision loss, gradually vision resumed OD, still has better vision OS.  MRI at that time showed 3 mm acute to early subacute infarct in the dorsal midline of the pons negative MRA head and neck. 07/2019 loop recorder explanted 05/01/2020 MRI showed new T2 hyperintense lesion of the right frontal white matter likely reflect an area of late subacute or chronic infarction. 06/19/2020 Subcentimeter likely subacute infarct at the left lateral aspect of the pons.  MRA head  unremarkable, carotid Doppler unremarkable, EF 70 to 75%.  LDL 63, A1c 6.2.  Put on aspirin and Brilinta for 1 months and then back to aspirin and the Plavix.  Continue Lipitor 80. 11/10/2021 - MRI showed possible small right cerebral peduncle infarcts.  MRA negative.  Carotid Doppler unremarkable.  EF 60 to 65%, LDL 34, A1c 6.5, discharged with ASA and brilinta 90 twice daily.  Outpatient follow-up with [facility], continued on aspirin and Brilinta 60 twice daily. 12/2021 admitted for right frontal SO and small left pontine infarct, MRA head and neck negative, ESR and CRP negative, LDL 36, A1c 6.3, P2Y12 = 39, continued on aspirin and Brilinta. 08/2022 admitted for left-sided weakness, dizziness and aphasia.  MRI showed left BG punctate infarct.  CTA head and neck unremarkable.  LDL 35, A1c 5.9.  EF 55 to 70%. 10/2022 admitted for double vision, MRI showed right temporal lobe and left CR, left lateral splenium infarcts. 12/2022 ESR and CRP negative, free protein S antigen normal, protein C activity normal, Antithrombin III normal, homocystine level 23.6, antiphospholipid antibody negative 01/2023 hypercoagulable workup showed homocystine 23.7, free protein S antigen 59, antiphospholipid antibodies negative, factor V Leyden mutation negative, Antithrombin III activity normal, ACE negative 04/2023 admitted for decreased vision, diplopia and nausea vomiting.  MRI showed right frontal parietal small infarct, questionable right splenium small infarct.  Continue aspirin Brilinta as well as Lipitor 80.  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, discharged with Keppra 03/2018 CSF showed WBC 30, RBC 0, protein 57, glucose 221, infectious work up neg  Off Keppra 05/2021, so far no recurrence Follow-up with [facility]  Diabetes HgbA1c 5.1 goal < 7.0 Controlled Home meds including Ozempic CBG monitoring SSI DM education and close PCP follow up  Hypertension Stable on the high  end Avoid low BP Long term BP goal normotensive  Hyperlipidemia Home meds: Lipitor 80 LDL 44, goal < 70 Now on Lipitor 80 Continue statin at discharge  Other Stroke Risk Factors Obstructive sleep apnea, on CPAP at home  Other Active Problems CKD 4, creatinine 2.84--2.89 Mild leukopenia, WBCs 3.8--3.9 Depression on Prozac Abdominal pain, management per primary team  Hospital day # 2   Marvel Plan, MD PhD Stroke Neurology 09/25/2023 6:46 PM    To contact Stroke Continuity provider, please refer to [email or web address]. After hours, contact General Neurology

## 2023-09-25 NOTE — Procedures (Signed)
 Procedure Note: Lumbar puncture  Indications: assess for CNS pathology   Operator: Consuelo Denmark, MD, PhD  Others present: RN Shelagh Derrick  Indications, risks, and benefits explained to patient / surrogate decision maker and informed consent obtained. Time-out was performed, with all individuals present agreeing on the procedure to be performed, the site of procedure, and the patient identity.  Patient positioned, prepped and draped in usual sterile fashion. L3-4 space located using bilateral iliac crests as landmarks. 1% Lidocaine without epinephrine was used to anesthetize the area. An 20G spinal needle was introduced into the sub- arachnoid space. Stylet was removed with appropriate fluid return. Needle removed after adequate fluid collected. Blood loss was minimal. A dry guaze dressing was placed over insertion site. Patient tolerated the procedure well and no complications were observed. Spontaneous movement of bilateral extremities were observed after the procedure.  Opening pressure: not measured  Total fluid removed: 12cc  Color of fluid: clear  Sent for: cell count + diff , gram stain, glucose, protein, crypto antigen, VDRL, oligoclonal band  Consuelo Denmark, MD PhD Stroke Neurology 09/25/2023 6:44 PM

## 2023-09-25 NOTE — Plan of Care (Signed)

## 2023-09-25 NOTE — Progress Notes (Signed)
 Physical Therapy Treatment Patient Details Name: Christina Orozco MRN: [id number] DOB: [birth year] Today's Date: 09/25/2023   History of Present Illness Pt is 53 yo female admitted on 09/21/23 with R sided weakness found to have L pontine infarct.  Pt with hx including but not limited to DM, HTN, CKD, anemia, PVD, recurrent CVAs (pt reports one every other month in 2024).    PT Comments  Patient resting in bed and agreeable to mobilize with therapy. Reports ongoing constipation and abdominal discomfort at start. Pt completed supine>sit EOB with supervision, no assist or cues needed, pt using Ue's to assist LE's off side. Pt demonstrated good management of rollator brakes for sit<>stand transfers and completed from EOB with supervision. Pt ambulated ~80' with rollator, CGA with good management of rollator, as fatigued Rt LE hyperextending and bil LE foot placement more ataxic, min assist intermittently to steady. On return to room pt reported urge for BM. CGA/min assist to maneuver rollator in bathroom, pt had successful BM, min assist for pericare. EOS pt returned to sit EOB, VSS throughout. RN arrived EOB to provided medication and assist with purewick repalcement.  BP pre gait: 136/82 mmHg BP post gait: 121/92 mmHg   If plan is discharge home, recommend the following: A little help with walking and/or transfers;A little help with bathing/dressing/bathroom;Assistance with cooking/housework;Help with stairs or ramp for entrance   Can travel by private vehicle        Equipment Recommendations  None recommended by PT    Recommendations for Other Services       Precautions / Restrictions Precautions Precautions: Fall Recall of Precautions/Restrictions: Intact Restrictions Weight Bearing Restrictions Per Provider Order: No     Mobility  Bed Mobility Overal bed mobility: Needs Assistance Bed Mobility: Supine to Sit, Sit to Supine     Supine to sit: Supervision Sit to supine:  Supervision   General bed mobility comments: sup for safety with sit<>stand    Transfers Overall transfer level: Needs assistance Equipment used: Rollator (4 wheels) Transfers: Sit to/from Stand Sit to Stand: Supervision           General transfer comment: sup for sit<>stand wtih Rollator, pt demonstrates safe management of brakes locking for transfers.    Ambulation/Gait Ambulation/Gait assistance: Min assist, Contact guard assist Gait Distance (Feet): 80 Feet Assistive device: Rollator (4 wheels) Gait Pattern/deviations: Step-to pattern, Decreased stride length, Decreased dorsiflexion - right, Knee hyperextension - right, Narrow base of support, Ataxic Gait velocity: decr     General Gait Details: pt managed rollator proximity well. no cues needed. as pt fatigues Rt knee hyperextending in stance and bil LE placement more ataxic. fading between CGA and min assist.   Stairs             Wheelchair Mobility     Tilt Bed    Modified Rankin (Stroke Patients Only)       Balance Overall balance assessment: Needs assistance Sitting-balance support: No upper extremity supported Sitting balance-Leahy Scale: Good     Standing balance support: Bilateral upper extremity supported, Reliant on assistive device for balance Standing balance-Leahy Scale: Poor Standing balance comment: reliant on AD for external support                            Communication Communication Communication: No apparent difficulties  Cognition Arousal: Alert Behavior During Therapy: WFL for tasks assessed/performed   PT - Cognitive impairments: No apparent impairments  Following commands: Intact      Cueing Cueing Techniques: Verbal cues  Exercises      General Comments        Pertinent Vitals/Pain Pain Assessment Pain Assessment: Faces Faces Pain Scale: Hurts little more Pain Location: stomach Pain Descriptors / Indicators:  Discomfort Pain Intervention(s): Limited activity within patient's tolerance, Monitored during session, Repositioned    Home Living                          Prior Function            PT Goals (current goals can now be found in the care plan section) Acute Rehab PT Goals PT Goal Formulation: With patient Time For Goal Achievement: 10/06/23 Potential to Achieve Goals: Good Progress towards PT goals: Progressing toward goals    Frequency    Min 2X/week      PT Plan      Co-evaluation              AM-PAC PT "6 Clicks" Mobility   Outcome Measure  Help needed turning from your back to your side while in a flat bed without using bedrails?: None Help needed moving from lying on your back to sitting on the side of a flat bed without using bedrails?: A Little Help needed moving to and from a bed to a chair (including a wheelchair)?: A Little Help needed standing up from a chair using your arms (e.g., wheelchair or bedside chair)?: A Little Help needed to walk in hospital room?: A Little Help needed climbing 3-5 steps with a railing? : A Little 6 Click Score: 19    End of Session Equipment Utilized During Treatment: Gait belt Activity Tolerance: Patient tolerated treatment well Patient left: in bed;with call bell/phone within reach;with bed alarm set (chair position) Nurse Communication: Mobility status PT Visit Diagnosis: Other abnormalities of gait and mobility (R26.89);Hemiplegia and hemiparesis Hemiplegia - Right/Left: Right Hemiplegia - dominant/non-dominant: Dominant Hemiplegia - caused by: Cerebral infarction     Time: 0981-1914 PT Time Calculation (min) (ACUTE ONLY): 38 min  Charges:    $Gait Training: 23-37 mins $Therapeutic Activity: 8-22 mins PT General Charges $$ ACUTE PT VISIT: 1 Visit                     Tish Forge, DPT Acute Rehabilitation Services Office [phone]  09/25/23 2:34 PM

## 2023-09-25 NOTE — Progress Notes (Signed)
 PROGRESS NOTE  Christina Orozco  WGN:[id number] DOB: 13-Sep-1970 DOA: 09/21/2023 PCP: Kerin Salen, PA-C  Consultants  Brief Narrative: 53 y.o. 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   Patient reports developing worsening right-sided weakness involving her arm and leg around 8 AM on 4/11.  She says she has gait imbalance at baseline from prior strokes.  She says she did not fall.  MRI brain without contrast showed 4 mm acute ischemic left pontine infarct.  No associated mass effect or hemorrhage.  Underlying advanced chronic microvascular schema disease with multiple remote lacunar infarct involving bilateral basal ganglia, thalami, and pons.  Multiple chronic microhemorrhages most pronounced about the pons and thalami, most characteristic of poorly controlled hypertension.   Assessment & Plan: Acute ischemic left pontine stroke History of recurrent CVAs with residual right-sided weakness: -Presented with worsening right-sided weakness, found to have acute ischemic left pontine stroke on brain MRI -MRA of head and neck did not show large vessel occlusion -Echocardiogram showed EF 55 to 60%, right ventricle systolic function is normal - Keep on telemetry, continue neurochecks - Allowing permissive hypertension - Continue DAPT regimen with aspirin 81 mg BID and Brilinta 90 mg BID - Continue atorvastatin - PT/OT/SLP eval - Hemoglobin A1c 5.1 and lipid panel showed LDL 44 -Neurology is considering cerebral angiogram, patient refused. - She is slowly improving.  Did have some headache today that resolved with analgesics.   CKD stage IV: Patient's creatinine is at baseline, at 2.90 - No signs of acute kidney injury.  Will  monitor.  Anemia of chronic disease: Hemoglobin 8.1   No obvious bleeding.   Continue to monitor.   Type 2 diabetes: Holding home meds and placed on SSI. CBG well-controlled   Hypertension: Okay to restart meds as per neurology -Restart Coreg 12.5 mg p.o. twice daily -Will start amlodipine from tomorrow morning -Continue hydralazine 10 mg IV every 4 hours as needed for BP more than 160/100   Mild intermittent asthma: Stable, continue Singulair and albuterol as needed.   Hyperlipidemia: Continue atorvastatin.   Depression/anxiety: Continue fluoxetine.   Constipation: Hold MiraLAX due to cramping abdominal pain this morning   OSA: Continue CPAP nightly.         DVT prophylaxis:  heparin injection 5,000 Units Start: 09/22/23 0600  Code Status:   Code Status: Full Code Level of care: Telemetry Medical Status is: Inpatient Disposition: moving towards discharge.  Home health PT recommended discharge   Consults called: Neurology  Subjective: Patient awake and alert this morning.  Eating and drinking well.  No complaints.  Feels that her strength is improving.  Objective: Vitals:   09/25/23 0931 09/25/23 1104 09/25/23 1330 09/25/23 1507  BP: 126/79 (!) 165/88 121/82 (!) 150/79  Pulse:  82  80  Resp: (!) 21 16 (!) 23 14  Temp:  98 F (36.7 C)  98.4 F (36.9 C)  TempSrc:  Oral  Oral  SpO2:  100%  100%  Weight:      Height:        Intake/Output Summary (Last 24 hours) at 09/25/2023 1753 Last data filed at 09/25/2023 1400 Gross per 24 hour  Intake 360 ml  Output 500 ml  Net -140 ml   Filed Weights   09/21/23 1341  Weight: 59.4 kg  Body mass index is 23.58 kg/m.  Gen: 53 y.o. female in no apparent distress.  Nontoxic Pulm: Non-labored breathing.  Clear to auscultation bilaterally.  CV: Regular rate and rhythm. No murmur, rub, or gallop. No JVD GI: Abdomen soft, non-tender, non-distended, with normoactive bowel sounds. No organomegaly or masses  felt. Ext: Warm, no deformities, no pedal edema Skin: No rashes, lesions  Neuro: Alert and oriented.  Strength 4 out of 5 bilateral upper extremities.  Strength 2 out of 5 right lower extremity. Psych: Calm  Judgement and insight appear normal. Mood & affect appropriate.     I have personally reviewed the following labs and images: CBC: Recent Labs  Lab 09/21/23 1421 09/22/23 0815  WBC 3.8* 3.9*  NEUTROABS 1.8  --   HGB 8.8* 8.1*  HCT 27.1* 24.5*  MCV 90.9 90.7  PLT 202 201   BMP &GFR Recent Labs  Lab 09/21/23 1421 09/22/23 0815 09/23/23 1150 09/24/23 0613 09/24/23 1128 09/25/23 0548  NA 142 144 143 143  --  139  K 4.3 3.8 4.3 4.2  --  3.8  CL 109 112* 112* 105  --  105  CO2 21* 22 21* 28  --  25  GLUCOSE 97 102* 101* 103*  --  99  BUN 36* 34* 30* 30*  --  32*  CREATININE 2.84* 2.94* 2.89* 2.81*  --  2.89*  CALCIUM 9.3 9.3 9.6 9.6  --  9.3  MG  --   --   --   --  2.1  --    Estimated Creatinine Clearance: 18.9 mL/min (A) (by C-G formula based on SCr of 2.89 mg/dL (H)). Liver & Pancreas: Recent Labs  Lab 09/21/23 1421  AST 33  ALT 35  ALKPHOS 43  BILITOT 0.7  PROT 7.3  ALBUMIN 4.0   Recent Labs  Lab 09/24/23 1128  LIPASE 41   No results for input(s): "AMMONIA" in the last 168 hours. Diabetic: No results for input(s): "HGBA1C" in the last 72 hours. Recent Labs  Lab 09/24/23 1654 09/24/23 2113 09/25/23 0613 09/25/23 1106 09/25/23 1656  GLUCAP 119* 103* 108* 118* 99   Cardiac Enzymes: No results for input(s): "CKTOTAL", "CKMB", "CKMBINDEX", "TROPONINI" in the last 168 hours. No results for input(s): "PROBNP" in the last 8760 hours. Coagulation Profile: No results for input(s): "INR", "PROTIME" in the last 168 hours. Thyroid Function Tests: No results for input(s): "TSH", "T4TOTAL", "FREET4", "T3FREE", "THYROIDAB" in the last 72 hours. Lipid Profile: No results for input(s): "CHOL", "HDL", "LDLCALC", "TRIG", "CHOLHDL", "LDLDIRECT" in the last 72  hours. Anemia Panel: No results for input(s): "VITAMINB12", "FOLATE", "FERRITIN", "TIBC", "IRON", "RETICCTPCT" in the last 72 hours. Urine analysis:    Component Value Date/Time   COLORURINE YELLOW 09/21/2023 1725   APPEARANCEUR HAZY (A) 09/21/2023 1725   LABSPEC 1.010 09/21/2023 1725   PHURINE 5.0 09/21/2023 1725   GLUCOSEU NEGATIVE 09/21/2023 1725   HGBUR NEGATIVE 09/21/2023 1725   BILIRUBINUR NEGATIVE 09/21/2023 1725   BILIRUBINUR neg 07/15/2018 1128   KETONESUR NEGATIVE 09/21/2023 1725   PROTEINUR 30 (A) 09/21/2023 1725   UROBILINOGEN 0.2 07/15/2018 1128   UROBILINOGEN 0.2 04/23/2017 0920   NITRITE NEGATIVE 09/21/2023 1725   LEUKOCYTESUR LARGE (A) 09/21/2023 1725   Sepsis Labs: Invalid input(s): "PROCALCITONIN", "LACTICIDVEN"  Microbiology: No results found for this or any previous visit (from the past 240 hours).  Radiology Studies: No results found.  Scheduled Meds:  amLODipine  5 mg Oral Daily   aspirin EC  81 mg Oral  BID   atorvastatin  80 mg Oral QHS   carvedilol  12.5 mg Oral BID WC   FLUoxetine  20 mg Oral Daily   heparin  5,000 Units Subcutaneous Q8H   insulin aspart  0-9 Units Subcutaneous TID WC   montelukast  10 mg Oral QHS   pantoprazole  40 mg Oral BID   senna-docusate  1 tablet Oral BID   Continuous Infusions:   LOS: 2 days   35 minutes with more than 50% spent in reviewing records, counseling patient/family and coordinating care.  Trenton Frock, MD [facility] [email or web address] 09/25/2023, 5:53 PM

## 2023-09-25 NOTE — Plan of Care (Signed)
 A/O x4, RA, CPAP when sleeping. Goal BP is <160. PRN was given. Pt complained of Headache, MD notified, PRN was given and gave some relief. LP was performed by Dr. Christiane Cowing. No complications. HOB is flat as ordered until 1030 pm. Low position, alarms are on, call bell in reach. Report was given to upcoming RN

## 2023-09-26 DIAGNOSIS — I10 Essential (primary) hypertension: Secondary | ICD-10-CM | POA: Diagnosis not present

## 2023-09-26 DIAGNOSIS — I639 Cerebral infarction, unspecified: Secondary | ICD-10-CM | POA: Diagnosis not present

## 2023-09-26 DIAGNOSIS — E1151 Type 2 diabetes mellitus with diabetic peripheral angiopathy without gangrene: Secondary | ICD-10-CM

## 2023-09-26 DIAGNOSIS — I6389 Other cerebral infarction: Secondary | ICD-10-CM | POA: Diagnosis not present

## 2023-09-26 DIAGNOSIS — E785 Hyperlipidemia, unspecified: Secondary | ICD-10-CM | POA: Diagnosis not present

## 2023-09-26 LAB — CBC
HCT: 25.2 % — ABNORMAL LOW (ref 36.0–46.0)
Hemoglobin: 8.3 g/dL — ABNORMAL LOW (ref 12.0–15.0)
MCH: 29.9 pg (ref 26.0–34.0)
MCHC: 32.9 g/dL (ref 30.0–36.0)
MCV: 90.6 fL (ref 80.0–100.0)
Platelets: 189 10*3/uL (ref 150–400)
RBC: 2.78 MIL/uL — ABNORMAL LOW (ref 3.87–5.11)
RDW: 15.4 % (ref 11.5–15.5)
WBC: 4.8 10*3/uL (ref 4.0–10.5)
nRBC: 0 % (ref 0.0–0.2)

## 2023-09-26 LAB — BASIC METABOLIC PANEL WITH GFR
Anion gap: 12 (ref 5–15)
BUN: 45 mg/dL — ABNORMAL HIGH (ref 6–20)
CO2: 21 mmol/L — ABNORMAL LOW (ref 22–32)
Calcium: 9.3 mg/dL (ref 8.9–10.3)
Chloride: 107 mmol/L (ref 98–111)
Creatinine, Ser: 3.25 mg/dL — ABNORMAL HIGH (ref 0.44–1.00)
GFR, Estimated: 17 mL/min — ABNORMAL LOW (ref >60)
Glucose, Bld: 101 mg/dL — ABNORMAL HIGH (ref 70–99)
Potassium: 3.8 mmol/L (ref 3.5–5.1)
Sodium: 140 mmol/L (ref 135–145)

## 2023-09-26 LAB — GLUCOSE, CAPILLARY
Glucose-Capillary: 105 mg/dL — ABNORMAL HIGH (ref 70–99)
Glucose-Capillary: 95 mg/dL (ref 70–99)
Glucose-Capillary: 133 mg/dL — ABNORMAL HIGH (ref 70–99)
Glucose-Capillary: 113 mg/dL — ABNORMAL HIGH (ref 70–99)

## 2023-09-26 LAB — CARDIOLIPIN ANTIBODIES, IGG, IGM, IGA
Anticardiolipin IgA: <9 U/mL (ref 0–11)
Anticardiolipin IgG: <9 GPL U/mL (ref 0–14)
Anticardiolipin IgM: 9 [MPL'U]/mL (ref 0–12)

## 2023-09-26 MED ORDER — TICAGRELOR 90 MG PO TABS
90.0000 mg | ORAL_TABLET | Freq: Two times a day (BID) | ORAL | Status: DC
  Administered 2023-09-26 – 2023-09-29 (×7): 90 mg via ORAL
  Filled 2023-09-26 (×7): qty 1

## 2023-09-26 MED ORDER — AMLODIPINE BESYLATE 5 MG PO TABS
10.0000 mg | ORAL_TABLET | Freq: Every day | ORAL | Status: DC
  Administered 2023-09-26 – 2023-09-29 (×4): 10 mg via ORAL
  Filled 2023-09-26 (×3): qty 2

## 2023-09-26 MED ORDER — AMLODIPINE BESYLATE 5 MG PO TABS
10.0000 mg | ORAL_TABLET | Freq: Every day | ORAL | Status: DC
  Filled 2023-09-26: qty 2

## 2023-09-26 NOTE — Plan of Care (Signed)

## 2023-09-26 NOTE — Care Management Important Message (Signed)
 Important Message  Patient Details  Name: Christina Orozco MRN: [id number] Date of Birth: 1970/08/09   Important Message Given:  Yes - Medicare IM     Wynonia Hedges 09/26/2023, 3:02 PM

## 2023-09-26 NOTE — Progress Notes (Signed)
 PROGRESS NOTE  Christina Orozco  ZOX:[id number] DOB: 1971/03/13 DOA: 09/21/2023 PCP: Kerin Salen, PA-C  Consultants  Brief Narrative: 53 y.o. 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   Patient reports developing worsening right-sided weakness involving her arm and leg around 8 AM on 4/11.  She says she has gait imbalance at baseline from prior strokes.  She says she did not fall.  MRI brain without contrast showed 4 mm acute ischemic left pontine infarct.  No associated mass effect or hemorrhage.  Underlying advanced chronic microvascular schema disease with multiple remote lacunar infarct involving bilateral basal ganglia, thalami, and pons.  Multiple chronic microhemorrhages most pronounced about the pons and thalami, most characteristic of poorly controlled hypertension.   Assessment & Plan: Acute ischemic left pontine stroke History of recurrent CVAs with residual right-sided weakness: - Still with weakness, though improving.  - working with PT on my exam - appreciate neuro input.  LP yesterday not concening for vasculitis - stroke likely 2/2 small vessel disease - maximize preventative measures - plan to resume brilinta today    CKD stage IV: Patient had increase in her creatinine today to 3.25.   Unclear baseline, but appears to be between 2.8 and possibly as high as 3.1 Will recheck tomorrow AM.   Anemia of chronic disease: Hemoglobin 8.1   No obvious bleeding.   Continue to monitor.   Type 2 diabetes: Holding home meds and placed on SSI. CBG well-controlled   Hypertension: Okay to restart meds as per neurology -Restart Coreg 12.5 mg p.o. twice daily -Will start amlodipine from tomorrow  morning -Continue hydralazine 10 mg IV every 4 hours as needed for BP more than 160/100   Mild intermittent asthma: Stable, continue Singulair and albuterol as needed.   Hyperlipidemia: Continue atorvastatin.   Depression/anxiety: Continue fluoxetine.   Constipation: Hold MiraLAX due to cramping abdominal pain this morning   OSA: Continue CPAP nightly.         DVT prophylaxis:  heparin injection 5,000 Units Start: 09/22/23 0600  Code Status:   Code Status: Full Code Level of care: Telemetry Medical Status is: Inpatient Disposition: moving towards discharge.  Home health PT recommended discharge.  Likely able DC home tomorrow if creatinine same or improves.    Consults called: Neurology  Subjective: Patient awake and alert this morning.  Eating and drinking well.  No complaints.  Feels that her strength is improving.  Still slight headache but better with pain meds.    Objective: Vitals:   09/26/23 0045 09/26/23 0408 09/26/23 0839 09/26/23 1127  BP: 132/83 (!) 143/84 139/84 (!) 154/87  Pulse: 79 89 80 80  Resp: 18 18 18 18   Temp: 98.2 F (36.8 C) (!) 97.5 F (36.4 C) 98.1 F (36.7 C) 98 F (36.7 C)  TempSrc: Oral Oral Oral Oral  SpO2: 100% 100% 100% 99%  Weight:      Height:        Intake/Output Summary (Last 24 hours) at 09/26/2023 1529 Last data filed at 09/26/2023 0536 Gross per 24 hour  Intake 120 ml  Output 700 ml  Net -580 ml   Filed Weights   09/21/23 1341  Weight: 59.4 kg   Body mass index is 23.58 kg/m.  Gen: 53 y.o. female in no apparent distress.  Nontoxic Pulm: Non-labored breathing.  Clear to auscultation bilaterally.  CV: Regular rate and rhythm. No murmur, rub, or gallop. No JVD GI: Abdomen soft, non-tender, non-distended, with normoactive bowel sounds. No organomegaly or masses felt. Ext: Warm, no deformities, no pedal edema Skin: No rashes, lesions  Neuro: Alert and oriented.  Strength 4 out of 5 bilateral upper extremities.   Strength 2 out of 5 right lower extremity. Psych: Calm  Judgement and insight appear normal. Mood & affect appropriate.     I have personally reviewed the following labs and images: CBC: Recent Labs  Lab 09/21/23 1421 09/22/23 0815 09/26/23 0616  WBC 3.8* 3.9* 4.8  NEUTROABS 1.8  --   --   HGB 8.8* 8.1* 8.3*  HCT 27.1* 24.5* 25.2*  MCV 90.9 90.7 90.6  PLT 202 201 189   BMP &GFR Recent Labs  Lab 09/22/23 0815 09/23/23 1150 09/24/23 0613 09/24/23 1128 09/25/23 0548 09/26/23 0616  NA 144 143 143  --  139 140  K 3.8 4.3 4.2  --  3.8 3.8  CL 112* 112* 105  --  105 107  CO2 22 21* 28  --  25 21*  GLUCOSE 102* 101* 103*  --  99 101*  BUN 34* 30* 30*  --  32* 45*  CREATININE 2.94* 2.89* 2.81*  --  2.89* 3.25*  CALCIUM 9.3 9.6 9.6  --  9.3 9.3  MG  --   --   --  2.1  --   --    Estimated Creatinine Clearance: 16.8 mL/min (A) (by C-G formula based on SCr of 3.25 mg/dL (H)). Liver & Pancreas: Recent Labs  Lab 09/21/23 1421  AST 33  ALT 35  ALKPHOS 43  BILITOT 0.7  PROT 7.3  ALBUMIN 4.0   Recent Labs  Lab 09/24/23 1128  LIPASE 41   No results for input(s): "AMMONIA" in the last 168 hours. Diabetic: No results for input(s): "HGBA1C" in the last 72 hours. Recent Labs  Lab 09/25/23 1106 09/25/23 1656 09/25/23 2126 09/26/23 0646 09/26/23 1127  GLUCAP 118* 99 93 105* 95   Cardiac Enzymes: No results for input(s): "CKTOTAL", "CKMB", "CKMBINDEX", "TROPONINI" in the last 168 hours. No results for input(s): "PROBNP" in the last 8760 hours. Coagulation Profile: No results for input(s): "INR", "PROTIME" in the last 168 hours. Thyroid Function Tests: No results for input(s): "TSH", "T4TOTAL", "FREET4", "T3FREE", "THYROIDAB" in the last 72 hours. Lipid Profile: No results for input(s): "CHOL", "HDL", "LDLCALC", "TRIG", "CHOLHDL", "LDLDIRECT" in the last 72 hours. Anemia Panel: No results for input(s): "VITAMINB12", "FOLATE", "FERRITIN", "TIBC", "IRON", "RETICCTPCT"  in the last 72 hours. Urine analysis:    Component Value Date/Time   COLORURINE YELLOW 09/21/2023 1725   APPEARANCEUR HAZY (A) 09/21/2023 1725   LABSPEC 1.010 09/21/2023 1725   PHURINE 5.0 09/21/2023 1725   GLUCOSEU NEGATIVE 09/21/2023 1725   HGBUR NEGATIVE 09/21/2023 1725   BILIRUBINUR NEGATIVE 09/21/2023 1725   BILIRUBINUR neg 07/15/2018 1128   KETONESUR NEGATIVE 09/21/2023 1725   PROTEINUR 30 (A) 09/21/2023 1725   UROBILINOGEN 0.2 07/15/2018 1128   UROBILINOGEN 0.2 04/23/2017 0920   NITRITE NEGATIVE 09/21/2023 1725   LEUKOCYTESUR LARGE (A) 09/21/2023 1725   Sepsis Labs: Invalid input(s): "PROCALCITONIN", "LACTICIDVEN"  Microbiology: Recent Results (from the past 240 hours)  CSF culture w Gram Stain     Status: None (Preliminary result)   Collection Time: 09/25/23  6:45 PM   Specimen: CSF; Cerebrospinal Fluid  Result Value Ref Range Status   Specimen  Description CSF  Final   Special Requests NONE  Final   Gram Stain NO WBC SEEN NO ORGANISMS SEEN CYTOSPIN SMEAR   Final   Culture   Final    NO GROWTH < 24 HOURS Performed at [facility], [address] [place], [place] [address]    Report Status PENDING  Incomplete    Radiology Studies: No results found.  Scheduled Meds:  amLODipine  10 mg Oral Daily   aspirin EC  81 mg Oral BID   atorvastatin  80 mg Oral QHS   carvedilol  12.5 mg Oral BID WC   FLUoxetine  20 mg Oral Daily   heparin  5,000 Units Subcutaneous Q8H   insulin aspart  0-9 Units Subcutaneous TID WC   montelukast  10 mg Oral QHS   pantoprazole  40 mg Oral BID   senna-docusate  1 tablet Oral BID   ticagrelor  90 mg Oral BID   Continuous Infusions:   LOS: 3 days   35 minutes with more than 50% spent in reviewing records, counseling patient/family and coordinating care.  Trenton Frock, MD [facility] [email or web address] 09/26/2023, 3:29 PM

## 2023-09-26 NOTE — Progress Notes (Signed)
 Physical Therapy Treatment Patient Details Name: Christina Orozco MRN: [id number] DOB: 1970/08/27 Today's Date: 09/26/2023   History of Present Illness Pt is 53 yo female admitted on 09/21/23 with R sided weakness found to have L pontine infarct.  Pt with hx including but not limited to DM, HTN, CKD, anemia, PVD, recurrent CVAs (pt reports one every other month in 2024)    PT Comments  Pt is slowly progressing towards goals. Pt was frustrated today with progress stating she makes progress and then regresses. Pt was offered chaplain services but pt declined. Mod I with bed mobility, sit to stand and Min A to CGA with gait utilizing a rollator. Due to pt current functional status, home set up and available assistance at home recommending skilled physical therapy services 3x/week in order to address strength, balance and functional mobility to decrease risk for falls, injury and re-hospitalization.      If plan is discharge home, recommend the following: A little help with walking and/or transfers;Assistance with cooking/housework;Help with stairs or ramp for entrance     Equipment Recommendations  None recommended by PT       Precautions / Restrictions Precautions Precautions: Fall Recall of Precautions/Restrictions: Intact Restrictions Weight Bearing Restrictions Per Provider Order: No     Mobility  Bed Mobility Overal bed mobility: Modified Independent Bed Mobility: Supine to Sit, Sit to Supine     Supine to sit: Modified independent (Device/Increase time) Sit to supine: Modified independent (Device/Increase time)   General bed mobility comments: increased time for bed mobility with 2 starts to pushing up from side lying to sitting due to weakness and headache.    Transfers Overall transfer level: Modified independent Equipment used: Rollator (4 wheels) Transfers: Sit to/from Stand Sit to Stand: Modified independent (Device/Increase time)           General transfer  comment: Good hand placement, no need for cues.    Ambulation/Gait Ambulation/Gait assistance: Min assist, Contact guard assist Gait Distance (Feet): 60 Feet Assistive device: Rollator (4 wheels) Gait Pattern/deviations: Step-to pattern, Decreased stride length, Decreased dorsiflexion - right, Knee hyperextension - right, Narrow base of support, Ataxic Gait velocity: decreased Gait velocity interpretation: <1.8 ft/sec, indicate of risk for recurrent falls   General Gait Details: pt managed rollator proximity well. no cues needed. as pt fatigues Rt knee hyperextending in stance and bil LE placement more ataxic. fading between CGA and min assist. Encouraged full WB through RLE with slight laxity in the knee to prevent hyperextension and increase proprioceptive input in the RLE with good success with improved gait pattern and decreased ataxic movement in the RLE for ~30 ft of gait; with fatigue pt began to have difficulty maintaining quad strength and full WB through RLE that resulted in increaesd ataxic movement of the RLE.    Modified Rankin (Stroke Patients Only) Modified Rankin (Stroke Patients Only) Pre-Morbid Rankin Score: Slight disability Modified Rankin: Moderate disability     Balance Overall balance assessment: Needs assistance Sitting-balance support: No upper extremity supported Sitting balance-Leahy Scale: Good     Standing balance support: Bilateral upper extremity supported, Reliant on assistive device for balance Standing balance-Leahy Scale: Poor Standing balance comment: reliant on AD for external support          Communication Communication Communication: No apparent difficulties  Cognition Arousal: Alert Behavior During Therapy: WFL for tasks assessed/performed, Flat affect   PT - Cognitive impairments: No apparent impairments       Following commands: Intact  Cueing Cueing Techniques: Verbal cues     General Comments General comments (skin  integrity, edema, etc.): HR remained WNL throughout session. No noted skin issues outside of gown.      Pertinent Vitals/Pain Pain Assessment Pain Score: 6  Pain Location: headache Pain Descriptors / Indicators: Headache Pain Intervention(s): Limited activity within patient's tolerance, Monitored during session     PT Goals (current goals can now be found in the care plan section) Acute Rehab PT Goals Patient Stated Goal: return home PT Goal Formulation: With patient Time For Goal Achievement: 10/06/23 Potential to Achieve Goals: Good Additional Goals Additional Goal #1: Pt will control R knee hyperextension during gait for safer gait pattern Progress towards PT goals: Progressing toward goals    Frequency    Min 2X/week      PT Plan  Continue with current POC        AM-PAC PT "6 Clicks" Mobility   Outcome Measure  Help needed turning from your back to your side while in a flat bed without using bedrails?: None Help needed moving from lying on your back to sitting on the side of a flat bed without using bedrails?: None Help needed moving to and from a bed to a chair (including a wheelchair)?: A Little Help needed standing up from a chair using your arms (e.g., wheelchair or bedside chair)?: None Help needed to walk in hospital room?: A Little Help needed climbing 3-5 steps with a railing? : A Little 6 Click Score: 21    End of Session Equipment Utilized During Treatment: Gait belt Activity Tolerance: Patient tolerated treatment well Patient left: in bed;with call bell/phone within reach;with bed alarm set Nurse Communication: Mobility status PT Visit Diagnosis: Other abnormalities of gait and mobility (R26.89);Hemiplegia and hemiparesis Hemiplegia - Right/Left: Right Hemiplegia - dominant/non-dominant: Dominant Hemiplegia - caused by: Cerebral infarction     Time: 0921-0951 PT Time Calculation (min) (ACUTE ONLY): 30 min  Charges:    $Gait Training: 8-22  mins $Therapeutic Activity: 8-22 mins PT General Charges $$ ACUTE PT VISIT: 1 Visit                    Sloan Duncans, DPT, CLT  Acute Rehabilitation Services Office: [phone] (Secure chat preferred)    Christina Orozco 09/26/2023, 10:04 AM

## 2023-09-26 NOTE — Progress Notes (Signed)
 STROKE TEAM PROGRESS NOTE   SUBJECTIVE (INTERVAL HISTORY) No family is at the bedside. Pt lying in bed, no acute event overnight. Had LP yesterday WBC 1 and protein 63. No evidence of CNS vasculitis at this time. Will resume brilinta.   OBJECTIVE Temp:  [97.5 F (36.4 C)-98.4 F (36.9 C)] 98.1 F (36.7 C) (04/16 0839) Pulse Rate:  [76-89] 80 (04/16 0839) Cardiac Rhythm: Normal sinus rhythm (04/16 0828) Resp:  [13-23] 18 (04/16 0839) BP: (121-150)/(79-84) 139/84 (04/16 0839) SpO2:  [100 %] 100 % (04/16 0839)  Recent Labs  Lab 09/25/23 0613 09/25/23 1106 09/25/23 1656 09/25/23 2126 09/26/23 0646  GLUCAP 108* 118* 99 93 105*   Recent Labs  Lab 09/22/23 0815 09/23/23 1150 09/24/23 0613 09/24/23 1128 09/25/23 0548 09/26/23 0616  NA 144 143 143  --  139 140  K 3.8 4.3 4.2  --  3.8 3.8  CL 112* 112* 105  --  105 107  CO2 22 21* 28  --  25 21*  GLUCOSE 102* 101* 103*  --  99 101*  BUN 34* 30* 30*  --  32* 45*  CREATININE 2.94* 2.89* 2.81*  --  2.89* 3.25*  CALCIUM 9.3 9.6 9.6  --  9.3 9.3  MG  --   --   --  2.1  --   --    Recent Labs  Lab 09/21/23 1421  AST 33  ALT 35  ALKPHOS 43  BILITOT 0.7  PROT 7.3  ALBUMIN 4.0   Recent Labs  Lab 09/21/23 1421 09/22/23 0815 09/26/23 0616  WBC 3.8* 3.9* 4.8  NEUTROABS 1.8  --   --   HGB 8.8* 8.1* 8.3*  HCT 27.1* 24.5* 25.2*  MCV 90.9 90.7 90.6  PLT 202 201 189        Component Value Date/Time   CHOL 128 09/22/2023 0815   CHOL 135 06/17/2020 1053   TRIG 59 09/22/2023 0815   HDL 72 09/22/2023 0815   HDL 47 06/17/2020 1053   CHOLHDL 1.8 09/22/2023 0815   VLDL 12 09/22/2023 0815   LDLCALC 44 09/22/2023 0815   LDLCALC 63 06/17/2020 1053   Lab Results  Component Value Date   HGBA1C 5.1 09/22/2023      Component Value Date/Time   LABOPIA NONE DETECTED 09/22/2023 1848   COCAINSCRNUR NONE DETECTED 09/22/2023 1848   LABBENZ NONE DETECTED 09/22/2023 1848   AMPHETMU NONE DETECTED 09/22/2023 1848   THCU NONE  DETECTED 09/22/2023 1848   LABBARB NONE DETECTED 09/22/2023 1848     I have personally reviewed the radiological images below and agree with the radiology interpretations.  DG Abd 1 View Result Date: 09/24/2023 CLINICAL DATA:  Acute abdominal pain.  Constipation. EXAM: ABDOMEN - 1 VIEW COMPARISON:  Radiographs 09/21/2023 and 03/26/2018. CT virtual colonoscopy 09/07/2023. FINDINGS: Single supine view the abdomen demonstrates a normal nonobstructive bowel gas pattern. Colonic stool burden within physiologic limits, improved from previous study. Right renal calculus noted. No acute osseous findings are seen. IMPRESSION: No evidence of bowel obstruction or other acute process. Right renal calculus. Electronically Signed   By: Carey Bullocks M.D.   On: 09/24/2023 10:17   VAS US CAROTID (at [facility] and [facility] only) Result Date: 09/23/2023 Carotid Arterial Duplex Study Patient Name:  Christina Orozco  Date of Exam:   09/23/2023 Medical Rec #: [id number]         Accession #:    [id number] Date of Birth: 20-May-1971        Patient Gender:  F Patient Age:   53 years Exam Location:  [facility] Procedure:      VAS US CAROTID Referring Phys: Eston Esters PATEL --------------------------------------------------------------------------------  Indications:       Recurrent CVA and Weakness. Risk Factors:      Hypertension, hyperlipidemia, Diabetes, PAD. Other Factors:     CKD IV, OSA on CPAP. Comparison Study:  Prior normal carotid duplex done 06/21/22 Performing Technologist: Sherren Kerns RVS  Examination Guidelines: A complete evaluation includes B-mode imaging, spectral Doppler, color Doppler, and power Doppler as needed of all accessible portions of each vessel. Bilateral testing is considered an integral part of a complete examination. Limited examinations for reoccurring indications may be performed as noted.  Right Carotid Findings: +----------+--------+--------+--------+------------------+------------------+            PSV cm/sEDV cm/sStenosisPlaque DescriptionComments           +----------+--------+--------+--------+------------------+------------------+ CCA Prox  79      18                                intimal thickening +----------+--------+--------+--------+------------------+------------------+ CCA Distal55      12                                intimal thickening +----------+--------+--------+--------+------------------+------------------+ ICA Prox  64      18                                                   +----------+--------+--------+--------+------------------+------------------+ ICA Mid   85      23                                                   +----------+--------+--------+--------+------------------+------------------+ ICA Distal129     33                                                   +----------+--------+--------+--------+------------------+------------------+ ECA       61      9                                                    +----------+--------+--------+--------+------------------+------------------+ +----------+--------+-------+--------+-------------------+           PSV cm/sEDV cmsDescribeArm Pressure (mmHG) +----------+--------+-------+--------+-------------------+ [id number]                                        +----------+--------+-------+--------+-------------------+ +---------+--------+--+--------+--+ VertebralPSV cm/s45EDV cm/s11 +---------+--------+--+--------+--+  Left Carotid Findings: +----------+--------+--------+--------+------------------+------------------+           PSV cm/sEDV cm/sStenosisPlaque DescriptionComments           +----------+--------+--------+--------+------------------+------------------+ CCA Prox  75      18  intimal thickening +----------+--------+--------+--------+------------------+------------------+ CCA Mid                           calcific                              +----------+--------+--------+--------+------------------+------------------+ CCA Distal63      18                                intimal thickening +----------+--------+--------+--------+------------------+------------------+ ICA Prox  72      19              focal and calcific                   +----------+--------+--------+--------+------------------+------------------+ ICA Mid   99      27              homogeneous                          +----------+--------+--------+--------+------------------+------------------+ ICA Distal99      31              homogeneous                          +----------+--------+--------+--------+------------------+------------------+ ECA       59      17                                                   +----------+--------+--------+--------+------------------+------------------+ +----------+--------+--------+--------+-------------------+           PSV cm/sEDV cm/sDescribeArm Pressure (mmHG) +----------+--------+--------+--------+-------------------+ [id number]                                          +----------+--------+--------+--------+-------------------+ +---------+--------+--+--------+--+ VertebralPSV cm/s60EDV cm/s20 +---------+--------+--+--------+--+   Summary: Right Carotid: The extracranial vessels were near-normal with only minimal wall                thickening or plaque. Left Carotid: The extracranial vessels were near-normal with only minimal wall               thickening or plaque. Vertebrals:  Bilateral vertebral arteries demonstrate antegrade flow. Subclavians: Normal flow hemodynamics were seen in bilateral subclavian              arteries. *See table(s) above for measurements and observations.  Electronically signed by Marvel Plan MD on 09/23/2023 at 5:30:47 PM.    Final    VAS [name] TRANSCRANIAL DOPPLER W BUBBLES Result Date: 09/23/2023  Transcranial Doppler with Bubble Patient Name:   Christina Orozco  Date of Exam:   09/23/2023 Medical Rec #: [id number]         Accession #:    [id number] Date of Birth: 15-Mar-1971        Patient Gender: F Patient Age:   56 years Exam Location:  [facility] Procedure:      VAS [name] TRANSCRANIAL DOPPLER W BUBBLES Referring Phys: Scheryl Marten Arieon Corcoran --------------------------------------------------------------------------------  Indications: Stroke. History: Recurrent stroke, hypertension, hyperlipidemia, diabetes, PVD, CKD 4, seizure, OSA on CPAP. Comparison  Study: No prior study Performing Technologist: Carleene Chase RVS  Examination Guidelines: A complete evaluation includes B-mode imaging, spectral Doppler, color Doppler, and power Doppler as needed of all accessible portions of each vessel. Bilateral testing is considered an integral part of a complete examination. Limited examinations for reoccurring indications may be performed as noted.  Summary:  A vascular evaluation was performed. The right middle cerebral artery was studied. An IV was inserted into the patient's left AC. Verbal informed consent was obtained.  No HITS noted at rest. Spencer Grade 1 noted with Valsalva maneuver (less than 10 HITS), likely clinically insignificant.  *See table(s) above for TCD measurements and observations.  Diagnosing physician: Consuelo Denmark MD Electronically signed by Consuelo Denmark MD on 09/23/2023 at 5:30:20 PM.    Final    ECHOCARDIOGRAM COMPLETE Result Date: 09/22/2023    ECHOCARDIOGRAM REPORT   Patient Name:   Christina Orozco Date of Exam: 09/22/2023 Medical Rec #:  [id number]        Height:       62.5 in Accession #:    [id number]       Weight:       131.0 lb Date of Birth:  1970-09-02       BSA:          1.607 m Patient Age:    52 years         BP:           154/78 mmHg Patient Gender: F                HR:           74 bpm. Exam Location:  Inpatient Procedure: 2D Echo, Cardiac Doppler and Color Doppler (Both Spectral and Color            Flow Doppler were utilized during  procedure). Indications:    Stroke I63.9  History:        Patient has prior history of Echocardiogram examinations, most                 recent 08/23/2022. Stroke, TIA and CKD, stage 4,                 Signs/Symptoms:Shortness of Breath; Risk Factors:Hypertension,                 Diabetes and Sleep Apnea.  Sonographer:    Terrilee Few RCS Referring Phys: [id number] VISHAL R PATEL IMPRESSIONS  1. Left ventricular ejection fraction, by estimation, is 55 to 60%. The left ventricle has normal function. The left ventricle has no regional wall motion abnormalities. There is mild concentric left ventricular hypertrophy. Left ventricular diastolic parameters were normal.  2. Right ventricular systolic function is normal. The right ventricular size is normal. There is normal pulmonary artery systolic pressure. The estimated right ventricular systolic pressure is 19.0 mmHg.  3. A small pericardial effusion is present. The pericardial effusion is circumferential. There is no evidence of cardiac tamponade.  4. The mitral valve is normal in structure. Trivial mitral valve regurgitation. No evidence of mitral stenosis.  5. The aortic valve is tricuspid. Aortic valve regurgitation is not visualized. No aortic stenosis is present.  6. The inferior vena cava is normal in size with greater than 50% respiratory variability, suggesting right atrial pressure of 3 mmHg. FINDINGS  Left Ventricle: Left ventricular ejection fraction, by estimation, is 55 to 60%. The left ventricle has normal function. The left ventricle has no regional wall motion abnormalities. The left  ventricular internal cavity size was normal in size. There is  mild concentric left ventricular hypertrophy. Left ventricular diastolic parameters were normal. Right Ventricle: The right ventricular size is normal. No increase in right ventricular wall thickness. Right ventricular systolic function is normal. There is normal pulmonary artery systolic pressure. The tricuspid  regurgitant velocity is 2.00 m/s, and  with an assumed right atrial pressure of 3 mmHg, the estimated right ventricular systolic pressure is 19.0 mmHg. Left Atrium: Left atrial size was normal in size. Right Atrium: Right atrial size was normal in size. Pericardium: A small pericardial effusion is present. The pericardial effusion is circumferential. There is no evidence of cardiac tamponade. Mitral Valve: The mitral valve is normal in structure. Trivial mitral valve regurgitation. No evidence of mitral valve stenosis. Tricuspid Valve: The tricuspid valve is normal in structure. Tricuspid valve regurgitation is trivial. Aortic Valve: The aortic valve is tricuspid. Aortic valve regurgitation is not visualized. No aortic stenosis is present. Aortic valve peak gradient measures 6.5 mmHg. Pulmonic Valve: The pulmonic valve was grossly normal. Pulmonic valve regurgitation is not visualized. No evidence of pulmonic stenosis. Aorta: The aortic root and ascending aorta are structurally normal, with no evidence of dilitation. Venous: The inferior vena cava is normal in size with greater than 50% respiratory variability, suggesting right atrial pressure of 3 mmHg. IAS/Shunts: No atrial level shunt detected by color flow Doppler.  LEFT VENTRICLE PLAX 2D LVIDd:         4.30 cm   Diastology LVIDs:         2.50 cm   LV e' medial:    8.49 cm/s LV PW:         1.40 cm   LV E/e' medial:  9.2 LV IVS:        1.30 cm   LV e' lateral:   12.80 cm/s LVOT diam:     2.10 cm   LV E/e' lateral: 6.1 LV SV:         57 LV SV Index:   36 LVOT Area:     3.46 cm  RIGHT VENTRICLE             IVC RV S prime:     12.30 cm/s  IVC diam: 1.90 cm TAPSE (M-mode): 1.9 cm LEFT ATRIUM             Index        RIGHT ATRIUM           Index LA diam:        3.00 cm 1.87 cm/m   RA Area:     10.00 cm LA Vol (A2C):   32.1 ml 19.98 ml/m  RA Volume:   19.70 ml  12.26 ml/m LA Vol (A4C):   39.8 ml 24.77 ml/m LA Biplane Vol: 38.5 ml 23.96 ml/m  AORTIC VALVE AV Area  (Vmax): 2.30 cm AV Vmax:        127.00 cm/s AV Peak Grad:   6.5 mmHg LVOT Vmax:      84.50 cm/s LVOT Vmean:     55.300 cm/s LVOT VTI:       0.166 m  AORTA Ao Root diam: 3.00 cm Ao Asc diam:  2.80 cm MITRAL VALVE               TRICUSPID VALVE MV Area (PHT): 4.31 cm    TR Peak grad:   16.0 mmHg MV Decel Time: 176 msec    TR Vmax:  200.00 cm/s MV E velocity: 77.70 cm/s MV A velocity: 91.60 cm/s  SHUNTS MV E/A ratio:  0.85        Systemic VTI:  0.17 m                            Systemic Diam: 2.10 cm Karyl Paget Croitoru MD Electronically signed by Luana Rumple MD Signature Date/Time: 09/22/2023/4:53:23 PM    Final    MR ANGIO HEAD WO CONTRAST Result Date: 09/22/2023 CLINICAL DATA:  Follow-up examination for stroke. EXAM: MRA HEAD WITHOUT CONTRAST TECHNIQUE: Angiographic images of the Circle of Willis were acquired using MRA technique without intravenous contrast. COMPARISON:  Prior MRI from 09/21/2023. FINDINGS: Anterior circulation: Both internal carotid arteries widely patent to the termini without stenosis. A1 segments widely patent. Normal anterior communicating artery complex. Both anterior cerebral arteries widely patent to their distal aspects without stenosis. No M1 stenosis or occlusion. Normal MCA bifurcations. Distal MCA branches well perfused and symmetric. Posterior circulation: Visualized distal V4 segments widely patent. Left vertebral artery dominant. Left PICA patent. Right PICA not seen. Basilar patent without stenosis. Superior cerebellar and posterior cerebral arteries patent bilaterally. Anatomic variants: As above. Other: No intracranial aneurysm. Small lipoma noted at the right frontal scalp. IMPRESSION: Normal intracranial MRA. Electronically Signed   By: Virgia Griffins M.D.   On: 09/22/2023 02:55   MR Cervical Spine Wo Contrast Result Date: 09/21/2023 CLINICAL DATA:  Initial evaluation for acute neck pain. EXAM: MRI CERVICAL SPINE WITHOUT CONTRAST TECHNIQUE: Multiplanar,  multisequence MR imaging of the cervical spine was performed. No intravenous contrast was administered. COMPARISON:  And MRI from 07/05/2015. FINDINGS: Alignment: Straightening with mild smooth reversal of the normal cervical lordosis. No significant listhesis. Vertebrae: Prior ACDF at C5-6 with solid arthrodesis. Vertebral body height maintained without acute or chronic fracture. Atypical hemangioma noted within the T1 vertebral body. No other worrisome osseous lesions. Degenerative reactive endplate change with marrow edema present about the C3-4 interspace. No other abnormal marrow edema. Cord: Normal signal and morphology. Posterior Fossa, vertebral arteries, paraspinal tissues: Changes of severe chronic microvascular ischemic disease noted within the pons. Craniocervical junction within normal limits. Paraspinous soft tissues within normal limits. Normal flow voids seen within the vertebral arteries bilaterally. Disc levels: C2-C3: Unremarkable. C3-C4: Degenerative intervertebral disc space narrowing with diffuse disc osteophyte complex. Broad posterior component flattens and effaces the ventral thecal sac. Mild cord flattening without cord signal changes. Mild to moderate spinal stenosis. Foramina remain patent. C4-C5: Small central disc protrusion indents the ventral thecal sac. No significant spinal stenosis. Mild uncovertebral spurring without significant foraminal encroachment. C5-C6: Prior fusion. No residual spinal stenosis. Foramina appear patent. C6-C7: Mild disc bulge with uncovertebral spurring. No spinal stenosis. Foramina remain patent. C7-T1: Normal interspace. Minimal right-sided facet hypertrophy. No canal or foraminal stenosis. IMPRESSION: 1. Degenerative disc osteophyte complex at C3-4 with resultant mild to moderate spinal stenosis. Degenerative reactive endplate change with marrow edema at this level could contribute to neck pain. 2. Prior ACDF at C5-6 without residual or recurrent stenosis.  3. Small central disc protrusion at C4-5 without significant stenosis. Electronically Signed   By: Virgia Griffins M.D.   On: 09/21/2023 21:57   MR BRAIN WO CONTRAST Result Date: 09/21/2023 CLINICAL DATA:  Initial evaluation for acute neuro deficit, right-sided symptoms. EXAM: MRI HEAD WITHOUT CONTRAST TECHNIQUE: Multiplanar, multiecho pulse sequences of the brain and surrounding structures were obtained without intravenous contrast. COMPARISON:  Comparison made with prior CT  from earlier the same day as well as previous MRI from 04/14/2023 FINDINGS: Brain: Generalized cerebral atrophy, most pronounced about the brainstem, advanced for age. Extensive T2/FLAIR hyperintensity involving the periventricular deep white matter both cerebral hemispheres, with patchy involvement of the deep gray nuclei, brainstem, and cerebellum, most likely related to advanced chronic microvascular ischemic disease. Multiple remote lacunar infarcts present about the bilateral basal ganglia, thalami, and pons. 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 No associated mass effect or convincing associated hemorrhage, although evaluation limited as there is multiple chronic micro hemorrhages at this location. No other evidence for acute or subacute ischemia. Gray-white matter differentiation otherwise maintained. No acute intracranial hemorrhage. Multiple chronic micro hemorrhages noted, most pronounced about the pons and thalami, most characteristic of chronic poorly controlled hypertension. No mass lesion, midline shift or mass effect. No hydrocephalus or extra-axial fluid collection. Pituitary gland within normal limits. Vascular: Major intracranial vascular flow voids are maintained. Skull and upper cervical spine: Craniocervical junction within normal limits. Bone marrow signal intensity normal. No scalp soft tissue abnormality. Sinuses/Orbits: Prior bilateral ocular lens replacement. Paranasal sinuses are  clear. No significant mastoid effusion. Other: None. IMPRESSION: 1. 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 No associated mass effect or hemorrhage. 2. Underlying advanced chronic microvascular ischemic disease, with multiple remote lacunar infarcts about the bilateral basal ganglia, thalami, and pons. 3. Multiple chronic micro hemorrhages, most pronounced about the pons and thalami, most characteristic of chronic poorly controlled hypertension. Electronically Signed   By: Virgia Griffins M.D.   On: 09/21/2023 21:51   CT Head Wo Contrast Result Date: 09/21/2023 CLINICAL DATA:  Right-sided weakness worse than baseline since waking up at 8 a.m. today. EXAM: CT HEAD WITHOUT CONTRAST TECHNIQUE: Contiguous axial images were obtained from the base of the skull through the vertex without intravenous contrast. RADIATION DOSE REDUCTION: This exam was performed according to the departmental dose-optimization program which includes automated exposure control, adjustment of the mA and/or kV according to patient size and/or use of iterative reconstruction technique. COMPARISON:  CT head without contrast 04/14/2023. MRI head 05/28/2023. FINDINGS: Brain: Multiple remote lacunar infarcts are again noted in the thalami bilaterally. Extensive periventricular white matter changes are present bilaterally. No acute infarct, hemorrhage, or mass lesion is present. Moderate confluent white matter hypoattenuation is stable. Basal ganglia are otherwise within normal limits. The insular ribbon is normal bilaterally. No acute or focal cortical abnormality is present. Extensive white matter changes are present the brainstem and right greater than left cerebellar peduncles, similar the prior exam. No acute infarct or hemorrhage is present within the posterior fossa. Midline structures are within normal limits. Vascular: Atherosclerotic calcifications are present within the cavernous internal carotid arteries bilaterally. No hyperdense  vessel is present. Skull: Calvarium is intact. No focal lytic or blastic lesions are present. No significant extracranial soft tissue lesion is present. Sinuses/Orbits: The paranasal sinuses and mastoid air cells are clear. Bilateral lens replacements are noted. Globes and orbits are otherwise unremarkable. IMPRESSION: 1. No acute intracranial abnormality or significant interval change. 2. Multiple remote lacunar infarcts in the thalami bilaterally. 3. Stable extensive periventricular white matter disease bilaterally is stable. This likely reflects the sequela of chronic microvascular ischemia. Electronically Signed   By: Audree Leas M.D.   On: 09/21/2023 18:18   DG Abdomen 1 View Result Date: 09/21/2023 CLINICAL DATA:  Constipation EXAM: ABDOMEN - 1 VIEW COMPARISON:  None Available. FINDINGS: A moderate volume of stool material is present in the right  colon. A moderate to large volume of well-formed stool material is favored to be in the left colon and rectum. IMPRESSION: 1. Imaging features suggestive of constipation. Electronically Signed   By: Lowell Guitar M.D.   On: 09/21/2023 16:08   CT VIRTUAL COLONOSCOPY DIAGNOSTIC Result Date: 09/08/2023 CLINICAL DATA:  Occult blood positive stool EXAM: CT VIRTUAL COLONOSCOPY DIAGNOSTIC TECHNIQUE: The patient was given a standard bowel preparation with Gastrografin and barium for fluid and stool tagging respectively. The quality of the bowel preparation is fair. Automated CO2 insufflation of the colon was performed prior to image acquisition and colonic distention is good. Image post processing was used to generate a 3D endoluminal fly-through projection of the colon and to electronically subtract stool/fluid as appropriate. COMPARISON:  11/21/2021 FINDINGS: VIRTUAL COLONOSCOPY Moderate amount of retained barium within the colon. Tortuous transverse colon and sigmoid colon. No fixed polypoid filling defects or annular constricting lesions. Virtual colonoscopy  is not designed to detect diminutive polyps (i.e., less than or equal to 5 mm), the presence or absence of which may not affect clinical management. CT ABDOMEN AND PELVIS WITHOUT CONTRAST Lower chest: No acute findings Hepatobiliary: No focal hepatic abnormality. Gallbladder unremarkable. Pancreas: No focal abnormality or ductal dilatation. Spleen: No focal abnormality.  Normal size. Adrenals/Urinary Tract: Stable 6 mm stone in the lower pole of the right kidney. No ureteral stones or hydronephrosis. Adrenal glands and urinary bladder unremarkable. Stomach/Bowel: Stomach and small bowel decompressed unremarkable. Vascular/Lymphatic: Aortic atherosclerosis. No evidence of aneurysm or adenopathy. Reproductive: Uterus and adnexa unremarkable.  No mass. Other: No free fluid or free air. Musculoskeletal: No acute bony abnormality. IMPRESSION: Tortuous colon. No fixed polypoid filling defects or annular constricting lesions. Aortic atherosclerosis. Right nephrolithiasis.  No hydronephrosis. No acute findings. Electronically Signed   By: Charlett Nose M.D.   On: 09/08/2023 00:16     PHYSICAL EXAM  Temp:  [97.5 F (36.4 C)-98.4 F (36.9 C)] 98.1 F (36.7 C) (04/16 0839) Pulse Rate:  [76-89] 80 (04/16 0839) Resp:  [13-23] 18 (04/16 0839) BP: (121-150)/(79-84) 139/84 (04/16 0839) SpO2:  [100 %] 100 % (04/16 0839)  General - Well nourished, well developed, in no apparent distress, on nasal pillow use for OSA.  Ophthalmologic - fundi not visualized due to noncooperation.  Cardiovascular - Regular rhythm and rate.  Neuro - awake, alert, eyes open, orientated to age, place, time and people. No aphasia, fluent language, following all simple commands. Able to name and repeat. No gaze palsy, tracking bilaterally, visual field full, PERRL. No facial droop. Tongue midline. Bilateral UEs 4/5, no drift. Bilaterally LEs 3/5, no drift. Sensation symmetrical bilaterally, b/l FTN and HTS ataxia, gait not tested.     ASSESSMENT/PLAN [name] is a 53 y.o. female with history of recurrent stroke, hypertension, hyperlipidemia, diabetes, PVD, CKD 4, seizure, OSA on CPAP admitted for right-sided weakness. No TNK given due to outside the window.    Stroke:  left pontine small infarct, etiology still more likely small vessel disease   CT no acute finding MRI small left pontine infarct MRA unremarkable Carotid Doppler unremarkable TCD bubble study Spencer degree 1 with Valsalva, clinically insignificant. 2D Echo EF 55-60% Patient refused cerebral angiogram afraid of kidney injury in setting of CKD CSF WBC 1, protein 63, glucose 68.  LDL 44 HgbA1c 5.1 Hypercoagulable workup neg except protein S activity 49, homocystine 22.5 Autoimmune workup neg, ANA negative, SSA and SSB negative UDS neg Heparin subcu for VTE prophylaxis aspirin 81 mg daily and Brilinta (  ticagrelor) 90 mg bid prior to admission, now on aspirin 81 mg daily and resume brilinta bid.  Patient counseled to be compliant with her antithrombotic medications Ongoing aggressive stroke risk factor management Therapy recommendations: Home health PT and OT Disposition: Pending  History of stroke 08/2015 CSF neg, protein 58 07/2017 MRI showed left pontine infarct, EF 65 to 70%, carotid Doppler and MRA negative.  LDL 43, A1c 12.5, patient put on DAPT and Lipitor 40. 08/2017 TEE showed no PFO. Loop recorder placed, no A. fib found 02/27/2019 woke up with complete vision loss, gradually vision resumed OD, still has better vision OS.  MRI at that time showed 3 mm acute to early subacute infarct in the dorsal midline of the pons negative MRA head and neck. 07/2019 loop recorder explanted 05/01/2020 MRI showed new T2 hyperintense lesion of the right frontal white matter likely reflect an area of late subacute or chronic infarction. 06/19/2020 Subcentimeter likely subacute infarct at the left lateral aspect of the pons.  MRA head unremarkable,  carotid Doppler unremarkable, EF 70 to 75%.  LDL 63, A1c 6.2.  Put on aspirin and Brilinta for 1 months and then back to aspirin and the Plavix.  Continue Lipitor 80. 11/10/2021 - MRI showed possible small right cerebral peduncle infarcts.  MRA negative.  Carotid Doppler unremarkable.  EF 60 to 65%, LDL 34, A1c 6.5, discharged with ASA and brilinta 90 twice daily.  Outpatient follow-up with [facility], continued on aspirin and Brilinta 60 twice daily. 12/2021 admitted for right frontal SO and small left pontine infarct, MRA head and neck negative, ESR and CRP negative, LDL 36, A1c 6.3, P2Y12 = 39, continued on aspirin and Brilinta. 08/2022 admitted for left-sided weakness, dizziness and aphasia.  MRI showed left BG punctate infarct.  CTA head and neck unremarkable.  LDL 35, A1c 5.9.  EF 55 to 70%. 10/2022 admitted for double vision, MRI showed right temporal lobe and left CR, left lateral splenium infarcts. 12/2022 ESR and CRP negative, free protein S antigen normal, protein C activity normal, Antithrombin III normal, homocystine level 23.6, antiphospholipid antibody negative 01/2023 hypercoagulable workup showed homocystine 23.7, free protein S antigen 59, antiphospholipid antibodies negative, factor V Leyden mutation negative, Antithrombin III activity normal, ACE negative 04/2023 admitted for decreased vision, diplopia and nausea vomiting.  MRI showed right frontal parietal small infarct, questionable right splenium small infarct.  Continue aspirin Brilinta as well as Lipitor 80.  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, discharged with Keppra 03/2018 CSF showed WBC 30, RBC 0, protein 57, glucose 221, infectious work up neg  Off Keppra 05/2021, so far no recurrence Follow-up with [facility]  Diabetes HgbA1c 5.1 goal < 7.0 Controlled Home meds including Ozempic CBG monitoring SSI DM education and close PCP follow up  Hypertension Stable on the high end Increase  amlodipine from 5 to 10mg  at her home level Continue home coreg Avoid low BP Long term BP goal normotensive  Hyperlipidemia Home meds: Lipitor 80 LDL 44, goal < 70 Now on Lipitor 80 Continue statin at discharge  Other Stroke Risk Factors Obstructive sleep apnea, on CPAP at home  Other Active Problems CKD 4, creatinine 2.84--2.89--3.25 Mild leukopenia, WBCs 3.8--3.9--4.8 Depression on Prozac Abdominal pain, management per primary team  Hospital day # 3  Neurology will sign off. Please call with questions. Pt will follow up with [facility] in about 4 weeks. Thanks for the consult.   Marvel Plan, MD PhD Stroke Neurology 09/26/2023  11:21 AM    To contact Stroke Continuity provider, please refer to [email or web address]. After hours, contact General Neurology

## 2023-09-27 ENCOUNTER — Inpatient Hospital Stay (HOSPITAL_COMMUNITY)

## 2023-09-27 DIAGNOSIS — I639 Cerebral infarction, unspecified: Secondary | ICD-10-CM | POA: Diagnosis not present

## 2023-09-27 LAB — CBC
HCT: 23.1 % — ABNORMAL LOW (ref 36.0–46.0)
Hemoglobin: 7.6 g/dL — ABNORMAL LOW (ref 12.0–15.0)
MCH: 29.5 pg (ref 26.0–34.0)
MCHC: 32.9 g/dL (ref 30.0–36.0)
MCV: 89.5 fL (ref 80.0–100.0)
Platelets: 187 10*3/uL (ref 150–400)
RBC: 2.58 MIL/uL — ABNORMAL LOW (ref 3.87–5.11)
RDW: 15.2 % (ref 11.5–15.5)
WBC: 4.7 10*3/uL (ref 4.0–10.5)
nRBC: 0 % (ref 0.0–0.2)

## 2023-09-27 LAB — URINALYSIS, ROUTINE W REFLEX MICROSCOPIC
Bilirubin Urine: NEGATIVE
Glucose, UA: NEGATIVE mg/dL
Ketones, ur: NEGATIVE mg/dL
Nitrite: NEGATIVE
Protein, ur: 30 mg/dL — AB
Specific Gravity, Urine: 1.013 (ref 1.005–1.030)
pH: 5 (ref 5.0–8.0)

## 2023-09-27 LAB — BASIC METABOLIC PANEL WITH GFR
Anion gap: 7 (ref 5–15)
BUN: 54 mg/dL — ABNORMAL HIGH (ref 6–20)
CO2: 22 mmol/L (ref 22–32)
Calcium: 9 mg/dL (ref 8.9–10.3)
Chloride: 108 mmol/L (ref 98–111)
Creatinine, Ser: 3.59 mg/dL — ABNORMAL HIGH (ref 0.44–1.00)
GFR, Estimated: 15 mL/min — ABNORMAL LOW (ref >60)
Glucose, Bld: 98 mg/dL (ref 70–99)
Potassium: 3.8 mmol/L (ref 3.5–5.1)
Sodium: 137 mmol/L (ref 135–145)

## 2023-09-27 LAB — GLUCOSE, CAPILLARY
Glucose-Capillary: 99 mg/dL (ref 70–99)
Glucose-Capillary: 101 mg/dL — ABNORMAL HIGH (ref 70–99)
Glucose-Capillary: 35 mg/dL — CL (ref 70–99)
Glucose-Capillary: 41 mg/dL — CL (ref 70–99)
Glucose-Capillary: 77 mg/dL (ref 70–99)
Glucose-Capillary: 151 mg/dL — ABNORMAL HIGH (ref 70–99)

## 2023-09-27 LAB — PROTEIN / CREATININE RATIO, URINE
Creatinine, Urine: 101 mg/dL
Protein Creatinine Ratio: 0.22 mg/mg{creat} — ABNORMAL HIGH (ref 0.00–0.15)
Total Protein, Urine: 22 mg/dL

## 2023-09-27 LAB — VDRL, CSF: VDRL Quant, CSF: NONREACTIVE

## 2023-09-27 NOTE — TOC Progression Note (Signed)
 Transition of Care [facility]) - Progression Note    Patient Details  Name: Christina Orozco MRN: [id number] Date of Birth: 05/24/1971  [facility] CM/SW Contact  Jonathan Neighbor, RN Phone Number: 09/27/2023, 10:07 AM  Clinical Narrative:     Plan is for home with home health services through [facility] at d/c.  TOC following.  Expected Discharge Plan: Home w Home Health Services Barriers to Discharge: Continued Medical Work up  Expected Discharge Plan and Services   Discharge Planning Services: CM Consult Post Acute Care Choice: Home Health Living arrangements for the past 2 months: Apartment                           HH Arranged: PT, OT           Social Determinants of Health (SDOH) Interventions SDOH Screenings   Food Insecurity: No Food Insecurity (09/22/2023)  Housing: Low Risk  (09/22/2023)  Transportation Needs: No Transportation Needs (09/22/2023)  Utilities: Not At Risk (09/22/2023)  Depression (PHQ2-9): Low Risk  (05/08/2023)  Financial Resource Strain: Medium Risk (09/06/2022)   Received from [facility], [facility]  Social Connections: Unknown (10/10/2021)   Received from [facility], [facility]  Tobacco Use: Low Risk  (09/21/2023)    Readmission Risk Interventions     No data to display

## 2023-09-27 NOTE — Consult Note (Signed)
 Christina Orozco  HISTORY AND PHYSICAL  Christina Orozco is an 53 y.o. female.    Chief Complaint: CVA   HPI: Pt is a 89F with a PMH sig for HTN, HLD, DM II, h/o nephrolithiasis, h/o CVA, and CKD GIV who is now seen in consultation for AKI on CKD.    Pt was admitted 09/21/2023 for R sided weakness and found to have L pontine stroke.  Was allowed permissive HTN per neurology recs, looks like she was on carvedilol for BP.  No CT contrast used.    Looks like pt had a Cr of 3.40 04/2023 2.89 09/04/23  On admission was 2.84 and has drifted up to 3.59, prompting eval.  Looks like she had some relatively low Bps in the 110s 4/14-4/15, now better.  No NSAIDs.  On PPI.  In this setting we are asked to see.  Good UOP, about 700 in canister.  Says that she's been drinking a lot of water and is urinating more than she was a couple of days ago.    PMH: Past Medical History:  Diagnosis Date   Anemia    severe   Anxiety    Asthma    CKD (chronic kidney disease)    stage IV   DDD (degenerative disc disease), cervical    Depression    Diabetes mellitus (HCC)    Dislocated shoulder    right   GERD (gastroesophageal reflux disease)    Headache    migraines (about once a month)   History of kidney stones    Hypertension    Neuropathy    Peripheral vascular disease (HCC)    Pneumonia    Stroke (HCC)    PSH: Past Surgical History:  Procedure Laterality Date   ADENOIDECTOMY     APPENDECTOMY     CERVICAL SPINE SURGERY  06/2012   C5-C6   EYE SURGERY     left eye surgery    FOOT SURGERY     KENALOG INJECTION Left 03/10/2021   Procedure: Intravitreal Avastin Injection;  Surgeon: Stephannie Li, MD;  Location: [facility] OR;  Service: Ophthalmology;  Laterality: Left;   LOOP RECORDER INSERTION N/A 08/27/2017   Procedure: LOOP RECORDER INSERTION;  Surgeon: Thurmon Fair, MD;  Location: [facility] INVASIVE CV LAB;  Service: Cardiovascular;  Laterality: N/A;   MEMBRANE PEEL Left 03/10/2021    Procedure: MEMBRANE PEEL;  Surgeon: Stephannie Li, MD;  Location: [facility] OR;  Service: Ophthalmology;  Laterality: Left;   PARS PLANA VITRECTOMY Left 03/10/2021   Procedure: TWENTY-FIVE GAUGE PARS PLANA VITRECTOMY;  Surgeon: Stephannie Li, MD;  Location: [facility] OR;  Service: Ophthalmology;  Laterality: Left;   PHOTOCOAGULATION WITH LASER Left 03/10/2021   Procedure: PHOTOCOAGULATION WITH LASER;  Surgeon: Stephannie Li, MD;  Location: [facility] OR;  Service: Ophthalmology;  Laterality: Left;   SHOULDER SURGERY     TEE WITHOUT CARDIOVERSION N/A 08/27/2017   Procedure: TRANSESOPHAGEAL ECHOCARDIOGRAM (TEE);  Surgeon: Thurmon Fair, MD;  Location: [facility] ENDOSCOPY;  Service: Cardiovascular;  Laterality: N/A;   TONSILLECTOMY      Past Medical History:  Diagnosis Date   Anemia    severe   Anxiety    Asthma    CKD (chronic kidney disease)    stage IV   DDD (degenerative disc disease), cervical    Depression    Diabetes mellitus (HCC)    Dislocated shoulder    right   GERD (gastroesophageal reflux disease)    Headache    migraines (about once a month)  History of kidney stones    Hypertension    Neuropathy    Peripheral vascular disease (HCC)    Pneumonia    Stroke (HCC)     Medications:  Scheduled:  amLODipine  10 mg Oral Daily   aspirin EC  81 mg Oral BID   atorvastatin  80 mg Oral QHS   carvedilol  12.5 mg Oral BID WC   FLUoxetine  20 mg Oral Daily   heparin  5,000 Units Subcutaneous Q8H   insulin aspart  0-9 Units Subcutaneous TID WC   montelukast  10 mg Oral QHS   pantoprazole  40 mg Oral BID   senna-docusate  1 tablet Oral BID   ticagrelor  90 mg Oral BID    Medications Prior to Admission  Medication Sig Dispense Refill   albuterol (PROAIR HFA) 108 (90 Base) MCG/ACT inhaler INHALE 1 OR 2 PUFFS INTO THE LUNGS EVERY 6 HOURS AS NEEDED FOR WHEEZING OR SHORTNESS OF BREATH 8.5 g 0   amLODipine (NORVASC) 10 MG tablet TAKE 1 TABLET (10 MG TOTAL) BY MOUTH DAILY. (AM) 90 tablet 3   aspirin  EC 81 MG tablet Take 1 tablet (81 mg total) by mouth 2 (two) times daily. Swallow whole. 100 tablet 0   atorvastatin (LIPITOR) 80 MG tablet Take 1 tablet (80 mg total) by mouth at bedtime. 90 tablet 1   carvedilol (COREG) 12.5 MG tablet Take 1 tablet (12.5 mg total) by mouth 2 (two) times daily with a meal. 180 tablet 2   FLUoxetine (PROZAC) 20 MG capsule Take 20 mg by mouth daily.     fluticasone (FLONASE) 50 MCG/ACT nasal spray Place 2 sprays into both nostrils daily as needed for allergies or rhinitis.     melatonin 5 MG TABS Take 1 tablet (5 mg total) by mouth at bedtime as needed. 30 tablet 0   montelukast (SINGULAIR) 10 MG tablet Take 10 mg by mouth at bedtime.     OZEMPIC, 0.25 OR 0.5 MG/DOSE, 2 MG/3ML SOPN Inject 0.5 mg into the skin See admin instructions. Inject 0.5 mg subcutaneous every 10 days     pantoprazole (PROTONIX) 40 MG tablet Take 1 tablet (40 mg total) by mouth 2 (two) times daily. 60 tablet 0   sodium bicarbonate 650 MG tablet Take 1 tablet (650 mg total) by mouth 3 (three) times daily. 90 tablet 0   ticagrelor (BRILINTA) 90 MG TABS tablet Take 1 tablet (90 mg total) by mouth 2 (two) times daily. 60 tablet 0   naphazoline-glycerin (CLEAR EYES REDNESS) 0.012-0.25 % SOLN Place 1-2 drops into the left eye 2 (two) times daily. (Patient not taking: Reported on 09/21/2023)      ALLERGIES:   Allergies  Allergen Reactions   Lisinopril Swelling and Other (See Comments)    Facial swelling  angioedema   Penicillins Hives    Has patient had a PCN reaction causing immediate rash, facial/tongue/throat swelling, SOB or lightheadedness with hypotension: yes Has patient had a PCN reaction causing severe rash involving mucus membranes or skin necrosis: No Has patient had a PCN reaction that required hospitalization No Has patient had a PCN reaction occurring within the last 10 years: No If all of the above answers are "NO", then may proceed wit Rash on buttock that makes sitting very  difficult   Quetiapine Other (See Comments)    Unknown    Gabapentin Swelling and Other (See Comments)    "I thought I was having another stroke." dizziness  FAM HX: Family History  Problem Relation Age of Onset   Vascular Disease Mother    CAD Mother    Heart failure Mother    Heart disease Other    Cancer Other        colon, stomach, pancreatic, lung   Diabetes Other    Breast cancer Sister 30   Seizures Maternal Uncle     Social History:   reports that she has never smoked. She has never used smokeless tobacco. She reports that she does not drink alcohol and does not use drugs.  ROS: ROS: all other systems reviewed and are negative except as per HPI  Blood pressure 119/67, pulse 77, temperature 98 F (36.7 C), temperature source Oral, resp. rate 18, height 5' 2.5" (1.588 m), weight 59.4 kg, last menstrual period 05/22/2018, SpO2 100%. PHYSICAL EXAM: Physical Exam GEN NAD, lying in bed HEENT EOMI PERRL NECK no JVD PULM clear CV RRR ABD soft EXT no LE edema NEURO AAO x3, some dysarthria SKIN no rashes MSK no effusions   Results for orders placed or performed during the hospital encounter of 09/21/23 (from the past 48 hours)  Glucose, capillary     Status: None   Collection Time: 09/25/23  4:56 PM  Result Value Ref Range   Glucose-Capillary 99 70 - 99 mg/dL    Comment: Glucose reference range applies only to samples taken after fasting for at least 8 hours.  CSF cell count with differential collection tube #: 1     Status: Abnormal   Collection Time: 09/25/23  6:45 PM  Result Value Ref Range   Tube # 1    Color, CSF COLORLESS COLORLESS   Appearance, CSF CLEAR (A) CLEAR   Supernatant NOT INDICATED    RBC Count, CSF 43 (H) 0 /cu mm   WBC, CSF 1 0 - 5 /cu mm   Other Cells, CSF TOO FEW TO COUNT, SMEAR AVAILABLE FOR REVIEW     Comment: RARE NEUTROPHILS, RARE LYMPHOCYTES, RARE MONOCYTES/MACROPHAGES Performed at [facility], [address]  [place], [place] [address]   CSF cell count with differential collection tube #: 4     Status: Abnormal   Collection Time: 09/25/23  6:45 PM  Result Value Ref Range   Tube # 4    Color, CSF COLORLESS COLORLESS   Appearance, CSF CLEAR (A) CLEAR   Supernatant NOT INDICATED    RBC Count, CSF 6 (H) 0 /cu mm   WBC, CSF 1 0 - 5 /cu mm   Other Cells, CSF TOO FEW TO COUNT, SMEAR AVAILABLE FOR REVIEW     Comment: RARE LYMPHOCYTES, RARE MONOCYTES/MACROPHAGES Performed at [facility], [address] [place], [place] [address]   CSF culture w Gram Stain     Status: None (Preliminary result)   Collection Time: 09/25/23  6:45 PM   Specimen: CSF; Cerebrospinal Fluid  Result Value Ref Range   Specimen Description CSF    Special Requests NONE    Gram Stain NO WBC SEEN NO ORGANISMS SEEN CYTOSPIN SMEAR     Culture      NO GROWTH 2 DAYS Performed at [facility], [address] [place], [place] [address]    Report Status PENDING   Cryptococcal antigen, CSF     Status: None   Collection Time: 09/25/23  6:45 PM  Result Value Ref Range   Crypto Ag NEGATIVE NEGATIVE   Cryptococcal Ag Titer NOT INDICATED NOT INDICATED  Comment: Performed at [facility], [address] [place], [place] [address]  Protein and glucose, CSF     Status: Abnormal   Collection Time: 09/25/23  8:16 PM  Result Value Ref Range   Glucose, CSF 68 40 - 70 mg/dL   Total  Protein, CSF 63 (H) 15 - 45 mg/dL    Comment: Performed at [facility], [address] [place], [place] [address]  VDRL, CSF     Status: None   Collection Time: 09/25/23  8:16 PM  Result Value Ref Range   VDRL Quant, CSF Non Reactive Non Rea:<1:1    Comment: (NOTE) Performed At: [facility] [address] [place], [place] [id number] Pearlean Botts MD WG:[id number]   Glucose, capillary     Status: None   Collection Time: 09/25/23  9:26 PM  Result Value Ref Range   Glucose-Capillary 93 70 - 99 mg/dL    Comment:  Glucose reference range applies only to samples taken after fasting for at least 8 hours.   Comment 1 Notify RN    Comment 2 Document in Chart   CBC     Status: Abnormal   Collection Time: 09/26/23  6:16 AM  Result Value Ref Range   WBC 4.8 4.0 - 10.5 K/uL   RBC 2.78 (L) 3.87 - 5.11 MIL/uL   Hemoglobin 8.3 (L) 12.0 - 15.0 g/dL   HCT 78.4 (L) 69.6 - 29.5 %   MCV 90.6 80.0 - 100.0 fL   MCH 29.9 26.0 - 34.0 pg   MCHC 32.9 30.0 - 36.0 g/dL   RDW 28.4 13.2 - 44.0 %   Platelets 189 150 - 400 K/uL   nRBC 0.0 0.0 - 0.2 %    Comment: Performed at [facility], [address] [place], [place] [address]  Basic metabolic panel with GFR     Status: Abnormal   Collection Time: 09/26/23  6:16 AM  Result Value Ref Range   Sodium 140 135 - 145 mmol/L   Potassium 3.8 3.5 - 5.1 mmol/L   Chloride 107 98 - 111 mmol/L   CO2 21 (L) 22 - 32 mmol/L   Glucose, Bld 101 (H) 70 - 99 mg/dL    Comment: Glucose reference range applies only to samples taken after fasting for at least 8 hours.   BUN 45 (H) 6 - 20 mg/dL   Creatinine, Ser 5.36 (H) 0.44 - 1.00 mg/dL   Calcium 9.3 8.9 - 64.4 mg/dL   GFR, Estimated 17 (L) >60 mL/min    Comment: (NOTE) Calculated using the CKD-EPI Creatinine Equation (2021)    Anion gap 12 5 - 15    Comment: Performed at [facility], [address], [place], [place] [address]  Glucose, capillary     Status: Abnormal   Collection Time: 09/26/23  6:46 AM  Result Value Ref Range   Glucose-Capillary 105 (H) 70 - 99 mg/dL    Comment: Glucose reference range applies only to samples taken after fasting for at least 8 hours.   Comment 1 Notify RN    Comment 2 Document in Chart   Glucose, capillary     Status: None   Collection Time: 09/26/23 11:27 AM  Result Value Ref Range   Glucose-Capillary 95 70 - 99 mg/dL    Comment: Glucose reference range applies only to samples taken after fasting for at least 8 hours.  Glucose, capillary     Status: Abnormal   Collection Time:  09/26/23  4:40 PM  Result Value Ref Range   Glucose-Capillary 133 (H) 70 - 99 mg/dL    Comment: Glucose reference range applies only to samples taken after fasting for at least 8 hours.  Glucose, capillary     Status: Abnormal   Collection Time: 09/26/23  9:11 PM  Result Value Ref Range   Glucose-Capillary 113 (H) 70 - 99 mg/dL    Comment: Glucose reference range applies only to samples taken after fasting for at least 8 hours.  Basic metabolic panel with GFR     Status: Abnormal   Collection Time: 09/27/23  5:00 AM  Result Value Ref Range   Sodium 137 135 - 145 mmol/L   Potassium 3.8 3.5 - 5.1 mmol/L   Chloride 108 98 - 111 mmol/L   CO2 22 22 - 32 mmol/L   Glucose, Bld 98 70 - 99 mg/dL    Comment: Glucose reference range applies only to samples taken after fasting for at least 8 hours.   BUN 54 (H) 6 - 20 mg/dL   Creatinine, Ser 4.78 (H) 0.44 - 1.00 mg/dL   Calcium 9.0 8.9 - 29.5 mg/dL   GFR, Estimated 15 (L) >60 mL/min    Comment: (NOTE) Calculated using the CKD-EPI Creatinine Equation (2021)    Anion gap 7 5 - 15    Comment: Performed at [facility], [address], [place], [place] [address]  CBC     Status: Abnormal   Collection Time: 09/27/23  5:00 AM  Result Value Ref Range   WBC 4.7 4.0 - 10.5 K/uL   RBC 2.58 (L) 3.87 - 5.11 MIL/uL   Hemoglobin 7.6 (L) 12.0 - 15.0 g/dL   HCT 86.5 (L) 78.4 - 69.6 %   MCV 89.5 80.0 - 100.0 fL   MCH 29.5 26.0 - 34.0 pg   MCHC 32.9 30.0 - 36.0 g/dL   RDW 29.5 28.4 - 13.2 %   Platelets 187 150 - 400 K/uL   nRBC 0.0 0.0 - 0.2 %    Comment: Performed at [facility], [address], [place], [place] [address]  Glucose, capillary     Status: None   Collection Time: 09/27/23  6:26 AM  Result Value Ref Range   Glucose-Capillary 99 70 - 99 mg/dL    Comment: Glucose reference range applies only to samples taken after fasting for at least 8 hours.  Glucose, capillary     Status: Abnormal   Collection Time: 09/27/23  1:33 PM   Result Value Ref Range   Glucose-Capillary 101 (H) 70 - 99 mg/dL    Comment: Glucose reference range applies only to samples taken after fasting for at least 8 hours.   Comment 1 Notify RN    Comment 2 Document in Chart     No results found.  Assessment/Plan  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  Not far off baseline, but maybe relative hypotension played a part.  No nephrotoxic agents.    - send UA and UP/C  - I'll send a renal US  too  - drink to thirst, avoid NSAIDs  - no need for dialysis at this time  - follows as OP with Dr Nance Aw  - has a history of nephrolithiasis- discussed no teas or sodas  2.  CVA  - L pontine  - per primary/ neuro  - getting PT/OT/SLP  - on ASA/ statin/ brilinta  3.  HTN:  - well controlled  4.  DM II:  Per primary  5.  Dispo: pending  Leandra Pro 09/27/2023, 3:34 PM

## 2023-09-27 NOTE — Progress Notes (Signed)
   09/27/23 2224  BiPAP/CPAP/SIPAP  BiPAP/CPAP/SIPAP Pt Type Adult  BiPAP/CPAP/SIPAP Resmed  Mask Type Nasal pillows  Respiratory Rate 16 breaths/min  FiO2 (%) 21 %  Patient Home Machine Yes  Safety Check Completed by RT for Home Unit Yes, no issues noted  Patient Home Mask Yes  Patient Home Tubing Yes  Device Plugged into RED Power Outlet Yes  BiPAP/CPAP /SiPAP Vitals  Pulse Rate 81  Resp 18  SpO2 99 %  MEWS Score/Color  MEWS Score 0  MEWS Score Color Marrie Sizer

## 2023-09-27 NOTE — Plan of Care (Signed)

## 2023-09-27 NOTE — Progress Notes (Signed)
   09/27/23 0000  BiPAP/CPAP/SIPAP  $ Non-Invasive Home Ventilator  Subsequent  BiPAP/CPAP/SIPAP Pt Type Adult  BiPAP/CPAP/SIPAP Resmed  Mask Type Nasal pillows  PEEP 4 cmH20  FiO2 (%) 21 %  Patient Home Machine Yes  Safety Check Completed by RT for Home Unit Yes, no issues noted  Patient Home Mask Yes  Patient Home Tubing Yes  Device Plugged into RED Power Outlet Yes  BiPAP/CPAP /SiPAP Vitals  Pulse Rate 84  Resp 16  SpO2 100 %  Bilateral Breath Sounds Clear;Diminished  MEWS Score/Color  MEWS Score 0  MEWS Score Color Marrie Sizer

## 2023-09-27 NOTE — Progress Notes (Signed)
 Occupational Therapy Treatment Patient Details Name: Christina Orozco MRN: [id number] DOB: 1970-06-25 Today's Date: 09/27/2023   History of present illness Pt is 53 yo female admitted on 09/21/23 with R sided weakness found to have L pontine infarct.  Pt with hx including but not limited to DM, HTN, CKD, anemia, PVD, recurrent CVAs (pt reports one every other month in 2024)   OT comments  Pt RLE remains uncoordinated with mobility, worked with pt to progress with LE coordination with controlled movements. Pt with c/o dizziness when OOB that has been present since Friday she reports, min A to CGA to maintain balance with RW. Incorporated overhead reaching tasks to work on shoulder ROM. OT to continue to progress pt as able. DC plans remain appropriate for [facility].       If plan is discharge home, recommend the following:  A little help with walking and/or transfers;A little help with bathing/dressing/bathroom;Assistance with cooking/housework;Assist for transportation;Help with stairs or ramp for entrance   Equipment Recommendations  None recommended by OT    Recommendations for Other Services      Precautions / Restrictions Precautions Precautions: Fall Recall of Precautions/Restrictions: Intact Restrictions Weight Bearing Restrictions Per Provider Order: No       Mobility Bed Mobility Overal bed mobility: Modified Independent Bed Mobility: Supine to Sit     Supine to sit: Modified independent (Device/Increase time)     General bed mobility comments: increased time to come to EOB, doing so without physical assist    Transfers Overall transfer level: Needs assistance Equipment used: Rollator (4 wheels) Transfers: Sit to/from Stand Sit to Stand: Supervision           General transfer comment: Good hand placement, no need for cues.     Balance Overall balance assessment: Needs assistance Sitting-balance support: No upper extremity supported Sitting balance-Leahy Scale:  Good     Standing balance support: Bilateral upper extremity supported, Reliant on assistive device for balance Standing balance-Leahy Scale: Poor Standing balance comment: reliant on AD for external support                           ADL either performed or assessed with clinical judgement   ADL       Grooming: Contact guard assist;Standing;Oral care;Wash/dry face Grooming Details (indicate cue type and reason): pt using RUE for grooming         Upper Body Dressing : Set up;Sitting (don gown like jacket)   Lower Body Dressing: Set up;Bed level (don socks)               Functional mobility during ADLs: Rolling walker (2 wheels);Minimal assistance      Extremity/Trunk Assessment              Vision       Perception     Praxis     Communication Communication Communication: No apparent difficulties   Cognition Arousal: Alert Behavior During Therapy: WFL for tasks assessed/performed, Flat affect Cognition: No family/caregiver present to determine baseline             OT - Cognition Comments: FCs appropriately, engaged throughout session                 Following commands: Intact        Cueing   Cueing Techniques: Verbal cues  Exercises Other Exercises Other Exercises: Overhead reaching with RUE while standing- emphasizing scapular Protraction Other Exercises: RLE tapping forward/back and side/side  across object on floor.    Shoulder Instructions       General Comments Pt c/o dizziness throughout session, started on Friday    Pertinent Vitals/ Pain       Pain Assessment Pain Assessment: No/denies pain  Home Living                                          Prior Functioning/Environment              Frequency  Min 2X/week        Progress Toward Goals  OT Goals(current goals can now be found in the care plan section)  Progress towards OT goals: Progressing toward goals  Acute Rehab OT  Goals Patient Stated Goal: go home OT Goal Formulation: With patient Time For Goal Achievement: 10/06/23 Potential to Achieve Goals: Good  Plan      Co-evaluation                 AM-PAC OT "6 Clicks" Daily Activity     Outcome Measure   Help from another person eating meals?: None Help from another person taking care of personal grooming?: A Little Help from another person toileting, which includes using toliet, bedpan, or urinal?: A Little Help from another person bathing (including washing, rinsing, drying)?: A Little Help from another person to put on and taking off regular upper body clothing?: A Little Help from another person to put on and taking off regular lower body clothing?: A Little 6 Click Score: 19    End of Session Equipment Utilized During Treatment: Gait belt;Rolling walker (2 wheels)  OT Visit Diagnosis: Unsteadiness on feet (R26.81);Muscle weakness (generalized) (M62.81);Low vision, both eyes (H54.2);Pain Pain - Right/Left: Right Pain - part of body: Knee   Activity Tolerance Patient tolerated treatment well   Patient Left with call bell/phone within reach;in chair;with chair alarm set   Nurse Communication Mobility status        Time: 1028-1100 OT Time Calculation (min): 32 min  Charges: OT General Charges $OT Visit: 1 Visit OT Treatments $Therapeutic Activity: 23-37 mins  09/27/2023  AB, OTR/L  Acute Rehabilitation Services  Office: [phone]   Jorene New 09/27/2023, 2:26 PM

## 2023-09-27 NOTE — Progress Notes (Addendum)
 PROGRESS NOTE  Christina Orozco  ZOX:[id number] DOB: [birth year] DOA: 09/21/2023 PCP: Hampton Levins, PA-C  Consultants  Brief Narrative: 53 y.o. 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   Patient reports developing worsening right-sided weakness involving her arm and leg around 8 AM on 4/11.  She says she has gait imbalance at baseline from prior strokes.  She says she did not fall.  MRI brain without contrast showed 4 mm acute ischemic left pontine infarct.  No associated mass effect or hemorrhage.  Underlying advanced chronic microvascular schema disease with multiple remote lacunar infarct involving bilateral basal ganglia, thalami, and pons.  Multiple chronic microhemorrhages most pronounced about the pons and thalami, most characteristic of poorly controlled hypertension.   Assessment & Plan: Acute ischemic left pontine stroke History of recurrent CVAs with residual right-sided weakness: - Weakness improving.  Continues to work with PT/OT, who recommend HHPT at DC - appreciate neuro input.  S/p LP 4/15 for concern for vasculitis in light of recurring strokes -- negative (see below for more details)  - stroke likely 2/2 small vessel disease - maximize preventative measures - brilinta/ASA restarted per neuro rec's 4/16  Oozing from LP site: - patietn had oozing from LP overnight last night, has continued through today.  - no hematoma on exam.  Oozing had soaked bandage overnight, but no further oozing on my exam today.  Nontender - Did have slight drop in Hgb, although baseline fluctuates.  Will recheck in AM.  - likely combination of ASA/brilinta/heparin.  Heparin stopped o/n.  Will continue to hold.  Continue  ASA/brilinta as bleeding appears to have stopped.  - If recurs, stop antiplatelet.  She is fully able to move BL legs and has no back pain, which is reassuring.    AKI on CKD stage IV: Patient had increase in her creatinine again today to 3.6 Unclear baseline, but appears to be between 2.8 and possibly as high as 3.1 Renal consulted today.  Urine studies plus US  ordered.  Appreciate input.   Anemia of chronic disease: Hgb dropped from 8.3 yesterday.  Recheck in AM, see oozing above.    Type 2 diabetes: Holding home meds and placed on SSI. CBG well-controlled   Hypertension: Okay to restart meds as per neurology - added norvasc as remained high.   -Continue hydralazine 10 mg IV every 4 hours as needed for BP more than 160/100   Mild intermittent asthma: Stable, continue Singulair and albuterol as needed.   Hyperlipidemia: Continue atorvastatin.   Depression/anxiety: Continue fluoxetine.   Constipation: Hold MiraLAX due to cramping abdominal pain this morning   OSA: Continue CPAP nightly.         DVT prophylaxis:  Place and maintain sequential compression device Start: 09/26/23 2117 heparin injection 5,000 Units Start: 09/22/23 0600  Code Status:   Code Status: Full Code Level of care: Telemetry Medical Status is: Inpatient Disposition: moving towards discharge.  Home health PT recommended discharge. Watching creatinine and LP site oozing   Consults called: Neurology, renal  Subjective: Patient awake and alert this morning.  Eating and drinking well.  Lying on her side with nursing who was changing her LP bandage.  No complaints.  Feels that her strength is improving.  HA has resolved.    Objective: Vitals:  09/27/23 0408 09/27/23 0733 09/27/23 1145 09/27/23 1614  BP: (!) 157/79 (!) 159/76 119/67 (!) 145/77  Pulse: 70 74 77 79  Resp: 16 18 18 18   Temp: 97.7 F (36.5 C) 98.2 F (36.8 C) 98 F (36.7 C) 98 F (36.7 C)  TempSrc: Oral Oral Oral Oral  SpO2:  100% 100% 100% 100%  Weight:      Height:        Intake/Output Summary (Last 24 hours) at 09/27/2023 1624 Last data filed at 09/26/2023 1700 Gross per 24 hour  Intake --  Output 400 ml  Net -400 ml   Filed Weights   09/21/23 1341  Weight: 59.4 kg   Body mass index is 23.58 kg/m.  Gen: 53 y.o. female in no apparent distress.  Nontoxic Pulm: Non-labored breathing.  Clear to auscultation bilaterally.  CV: Regular rate and rhythm. No murmur, rub, or gallop. No JVD GI: Abdomen soft, non-tender, non-distended, with normoactive bowel sounds. No organomegaly or masses felt. Back:  Nontender at site of LP.  No further bleeding/oozing.  No hematoma palpated.  No bruising noted.  Ext: Warm, no deformities, no pedal edema Skin: No rashes, lesions  Neuro: Alert and oriented.  Strength 4 out of 5 bilateral upper extremities.  Strength 2 out of 5 right lower extremity. Psych: Calm  Judgement and insight appear normal. Mood & affect appropriate.     I have personally reviewed the following labs and images: CBC: Recent Labs  Lab 09/21/23 1421 09/22/23 0815 09/26/23 0616 09/27/23 0500  WBC 3.8* 3.9* 4.8 4.7  NEUTROABS 1.8  --   --   --   HGB 8.8* 8.1* 8.3* 7.6*  HCT 27.1* 24.5* 25.2* 23.1*  MCV 90.9 90.7 90.6 89.5  PLT 202 201 189 187   BMP &GFR Recent Labs  Lab 09/23/23 1150 09/24/23 0613 09/24/23 1128 09/25/23 0548 09/26/23 0616 09/27/23 0500  NA 143 143  --  139 140 137  K 4.3 4.2  --  3.8 3.8 3.8  CL 112* 105  --  105 107 108  CO2 21* 28  --  25 21* 22  GLUCOSE 101* 103*  --  99 101* 98  BUN 30* 30*  --  32* 45* 54*  CREATININE 2.89* 2.81*  --  2.89* 3.25* 3.59*  CALCIUM 9.6 9.6  --  9.3 9.3 9.0  MG  --   --  2.1  --   --   --    Estimated Creatinine Clearance: 15.2 mL/min (A) (by C-G formula based on SCr of 3.59 mg/dL (H)). Liver & Pancreas: Recent Labs  Lab 09/21/23 1421  AST 33  ALT 35  ALKPHOS 43  BILITOT 0.7  PROT 7.3  ALBUMIN 4.0   Recent Labs  Lab  09/24/23 1128  LIPASE 41   No results for input(s): "AMMONIA" in the last 168 hours. Diabetic: No results for input(s): "HGBA1C" in the last 72 hours. Recent Labs  Lab 09/26/23 1127 09/26/23 1640 09/26/23 2111 09/27/23 0626 09/27/23 1333  GLUCAP 95 133* 113* 99 101*   Cardiac Enzymes: No results for input(s): "CKTOTAL", "CKMB", "CKMBINDEX", "TROPONINI" in the last 168 hours. No results for input(s): "PROBNP" in the last 8760 hours. Coagulation Profile: No results for input(s): "INR", "PROTIME" in the last 168 hours. Thyroid Function Tests: No results for input(s): "TSH", "T4TOTAL", "FREET4", "T3FREE", "THYROIDAB" in the last 72 hours. Lipid Profile: No results for input(s): "CHOL", "HDL", "LDLCALC", "TRIG", "CHOLHDL", "LDLDIRECT" in the last 72 hours. Anemia Panel: No  results for input(s): "VITAMINB12", "FOLATE", "FERRITIN", "TIBC", "IRON", "RETICCTPCT" in the last 72 hours. Urine analysis:    Component Value Date/Time   COLORURINE YELLOW 09/21/2023 1725   APPEARANCEUR HAZY (A) 09/21/2023 1725   LABSPEC 1.010 09/21/2023 1725   PHURINE 5.0 09/21/2023 1725   GLUCOSEU NEGATIVE 09/21/2023 1725   HGBUR NEGATIVE 09/21/2023 1725   BILIRUBINUR NEGATIVE 09/21/2023 1725   BILIRUBINUR neg 07/15/2018 1128   KETONESUR NEGATIVE 09/21/2023 1725   PROTEINUR 30 (A) 09/21/2023 1725   UROBILINOGEN 0.2 07/15/2018 1128   UROBILINOGEN 0.2 04/23/2017 0920   NITRITE NEGATIVE 09/21/2023 1725   LEUKOCYTESUR LARGE (A) 09/21/2023 1725   Sepsis Labs: Invalid input(s): "PROCALCITONIN", "LACTICIDVEN"  Microbiology: Recent Results (from the past 240 hours)  CSF culture w Gram Stain     Status: None (Preliminary result)   Collection Time: 09/25/23  6:45 PM   Specimen: CSF; Cerebrospinal Fluid  Result Value Ref Range Status   Specimen Description CSF  Final   Special Requests NONE  Final   Gram Stain NO WBC SEEN NO ORGANISMS SEEN CYTOSPIN SMEAR   Final   Culture   Final    NO GROWTH 2  DAYS Performed at [facility], [address] [place], [place] [address]    Report Status PENDING  Incomplete    Radiology Studies: No results found.  Scheduled Meds:  amLODipine  10 mg Oral Daily   aspirin EC  81 mg Oral BID   atorvastatin  80 mg Oral QHS   carvedilol  12.5 mg Oral BID WC   FLUoxetine  20 mg Oral Daily   heparin  5,000 Units Subcutaneous Q8H   insulin aspart  0-9 Units Subcutaneous TID WC   montelukast  10 mg Oral QHS   pantoprazole  40 mg Oral BID   senna-docusate  1 tablet Oral BID   ticagrelor  90 mg Oral BID   Continuous Infusions:   LOS: 4 days   35 minutes with more than 50% spent in reviewing records, counseling patient/family and coordinating care.  Trenton Frock, MD [facility] [email or web address] 09/27/2023, 4:24 PM

## 2023-09-27 NOTE — Progress Notes (Signed)
   09/27/23 2026  Provider Notification  Provider Name/Title [facility]  Date Provider Notified 09/27/23  Time Provider Notified 2025  Method of Notification Page (text)  Notification Reason Critical Result  Test performed and critical result CBG 35 AND 41, ORANGE JUICE AND GLUCERNA DRINK GIVEN, 77 AFTER CHECK  Date Critical Result Received 09/27/23  Time Critical Result Received 1948  Provider response Other (Comment) (Recheck after one hour)  Date of Provider Response 09/27/23  Time of Provider Response 2026

## 2023-09-27 NOTE — Progress Notes (Signed)
 Physical Therapy Treatment Patient Details Name: Christina Orozco MRN: [id number] DOB: [birth year] Today's Date: 09/27/2023   History of Present Illness Pt is 53 yo female admitted on 09/21/23 with R sided weakness found to have L pontine infarct.  Pt with hx including but not limited to DM, HTN, CKD, anemia, PVD, recurrent CVAs (pt reports one every other month in 2024)    PT Comments  Slow progress towards goals. Currently pt is Mod I for bed mobility, sit to stand, and CGA for 80 ft of gait with improved gait pattern. Due to pt current functional status, home set up and available assistance at home recommending skilled physical therapy services 3x/week in order to address strength, balance and functional mobility to decrease risk for falls, injury and re-hospitalization.      If plan is discharge home, recommend the following: A little help with walking and/or transfers;Assistance with cooking/housework;Help with stairs or ramp for entrance     Equipment Recommendations  None recommended by PT       Precautions / Restrictions Precautions Precautions: Fall Recall of Precautions/Restrictions: Intact Restrictions Weight Bearing Restrictions Per Provider Order: No     Mobility  Bed Mobility Overal bed mobility: Modified Independent Bed Mobility: Sit to Supine       Sit to supine: Modified independent (Device/Increase time)        Transfers Overall transfer level: Modified independent Equipment used: Rollator (4 wheels) Transfers: Sit to/from Stand Sit to Stand: Modified independent (Device/Increase time)           General transfer comment: Good hand placement, no need for cues.    Ambulation/Gait Ambulation/Gait assistance: Contact guard assist Gait Distance (Feet): 80 Feet Assistive device: Rollator (4 wheels) Gait Pattern/deviations: Step-to pattern, Decreased stride length, Decreased dorsiflexion - right, Knee hyperextension - right, Narrow base of support,  Ataxic, Steppage Gait velocity: decreased Gait velocity interpretation: <1.8 ft/sec, indicate of risk for recurrent falls   General Gait Details: improved gait pattern with less hyperextension at teh RLE this session. Pt was able to perform heel toe gait pattern for short period of time with demonstration and verbal cues. With fatigue pt has increased steppage gait due to poor proprioception.   Modified Rankin (Stroke Patients Only) Modified Rankin (Stroke Patients Only) Pre-Morbid Rankin Score: Slight disability Modified Rankin: Moderate disability     Balance Overall balance assessment: Needs assistance Sitting-balance support: No upper extremity supported Sitting balance-Leahy Scale: Good     Standing balance support: Bilateral upper extremity supported, Reliant on assistive device for balance Standing balance-Leahy Scale: Poor Standing balance comment: reliant on AD for external support        Communication Communication Communication: No apparent difficulties  Cognition Arousal: Alert Behavior During Therapy: WFL for tasks assessed/performed, Flat affect   PT - Cognitive impairments: No apparent impairments       Following commands: Intact      Cueing Cueing Techniques: Verbal cues     General Comments General comments (skin integrity, edema, etc.): no signs/symptoms of cardiac/respiratory distress throughout session.      Pertinent Vitals/Pain Pain Assessment Pain Score: 6  Pain Location: low back Pain Descriptors / Indicators: Aching Pain Intervention(s): Limited activity within patient's tolerance, Monitored during session     PT Goals (current goals can now be found in the care plan section) Acute Rehab PT Goals Patient Stated Goal: return home PT Goal Formulation: With patient Time For Goal Achievement: 10/06/23 Potential to Achieve Goals: Good Additional Goals Additional Goal #1: Pt will  control R knee hyperextension during gait for safer gait  pattern Progress towards PT goals: Progressing toward goals    Frequency    Min 2X/week      PT Plan  Continue with current POC        AM-PAC PT "6 Clicks" Mobility   Outcome Measure  Help needed turning from your back to your side while in a flat bed without using bedrails?: None Help needed moving from lying on your back to sitting on the side of a flat bed without using bedrails?: None Help needed moving to and from a bed to a chair (including a wheelchair)?: A Little Help needed standing up from a chair using your arms (e.g., wheelchair or bedside chair)?: None Help needed to walk in hospital room?: A Little Help needed climbing 3-5 steps with a railing? : A Little 6 Click Score: 21    End of Session Equipment Utilized During Treatment: Gait belt Activity Tolerance: Patient tolerated treatment well Patient left: in bed;with call bell/phone within reach;with bed alarm set Nurse Communication: Mobility status PT Visit Diagnosis: Other abnormalities of gait and mobility (R26.89);Hemiplegia and hemiparesis Hemiplegia - Right/Left: Right Hemiplegia - dominant/non-dominant: Dominant Hemiplegia - caused by: Cerebral infarction     Time: 1304-1330 PT Time Calculation (min) (ACUTE ONLY): 26 min  Charges:    $Gait Training: 8-22 mins $Therapeutic Activity: 8-22 mins PT General Charges $$ ACUTE PT VISIT: 1 Visit                     Sloan Duncans, DPT, CLT  Acute Rehabilitation Services Office: [phone] (Secure chat preferred)    Christina Orozco 09/27/2023, 3:25 PM

## 2023-09-28 DIAGNOSIS — I639 Cerebral infarction, unspecified: Secondary | ICD-10-CM | POA: Diagnosis not present

## 2023-09-28 LAB — CBC
HCT: 24 % — ABNORMAL LOW (ref 36.0–46.0)
Hemoglobin: 8 g/dL — ABNORMAL LOW (ref 12.0–15.0)
MCH: 30 pg (ref 26.0–34.0)
MCHC: 33.3 g/dL (ref 30.0–36.0)
MCV: 89.9 fL (ref 80.0–100.0)
Platelets: 175 10*3/uL (ref 150–400)
RBC: 2.67 MIL/uL — ABNORMAL LOW (ref 3.87–5.11)
RDW: 15 % (ref 11.5–15.5)
WBC: 4.2 10*3/uL (ref 4.0–10.5)
nRBC: 0 % (ref 0.0–0.2)

## 2023-09-28 LAB — GLUCOSE, CAPILLARY
Glucose-Capillary: 132 mg/dL — ABNORMAL HIGH (ref 70–99)
Glucose-Capillary: 94 mg/dL (ref 70–99)
Glucose-Capillary: 136 mg/dL — ABNORMAL HIGH (ref 70–99)
Glucose-Capillary: 129 mg/dL — ABNORMAL HIGH (ref 70–99)
Glucose-Capillary: 122 mg/dL — ABNORMAL HIGH (ref 70–99)

## 2023-09-28 LAB — RENAL FUNCTION PANEL
Albumin: 3.3 g/dL — ABNORMAL LOW (ref 3.5–5.0)
Anion gap: 8 (ref 5–15)
BUN: 61 mg/dL — ABNORMAL HIGH (ref 6–20)
CO2: 21 mmol/L — ABNORMAL LOW (ref 22–32)
Calcium: 9.1 mg/dL (ref 8.9–10.3)
Chloride: 107 mmol/L (ref 98–111)
Creatinine, Ser: 3.38 mg/dL — ABNORMAL HIGH (ref 0.44–1.00)
GFR, Estimated: 16 mL/min — ABNORMAL LOW (ref >60)
Glucose, Bld: 88 mg/dL (ref 70–99)
Phosphorus: 5 mg/dL — ABNORMAL HIGH (ref 2.5–4.6)
Potassium: 4.3 mmol/L (ref 3.5–5.1)
Sodium: 136 mmol/L (ref 135–145)

## 2023-09-28 MED ORDER — INSULIN ASPART 100 UNIT/ML IJ SOLN: 0.0000 [IU] | Freq: Every day | INTRAMUSCULAR | Status: DC

## 2023-09-28 MED ORDER — INSULIN ASPART 100 UNIT/ML IJ SOLN: 0.0000 [IU] | Freq: Three times a day (TID) | INTRAMUSCULAR | Status: DC

## 2023-09-28 MED ORDER — LACTULOSE 10 GM/15ML PO SOLN
30.0000 g | Freq: Two times a day (BID) | ORAL | Status: DC | PRN
  Administered 2023-09-28: 30 g via ORAL
  Filled 2023-09-28: qty 60

## 2023-09-28 NOTE — Progress Notes (Signed)
 Patient placed herself on home CPAP for the night

## 2023-09-28 NOTE — Inpatient Diabetes Management (Signed)
 Inpatient Diabetes Program Recommendations  AACE/ADA: New Consensus Statement on Inpatient Glycemic Control (2015)  Target Ranges:  Prepandial:   less than 140 mg/dL      Peak postprandial:   less than 180 mg/dL (1-2 hours)      Critically ill patients:  140 - 180 mg/dL   Lab Results  Component Value Date   GLUCAP 94 09/28/2023   HGBA1C 5.1 09/22/2023    Latest Reference Range & Units 09/27/23 19:48 09/27/23 19:53 09/27/23 20:14 09/27/23 21:40 09/28/23 06:22  Glucose-Capillary 70 - 99 mg/dL 35 (LL) 41 (LL) 77 848 (H) 94  (LL): Data is critically low (H): Data is abnormally high Review of Glycemic Control  Diabetes history: DM2 Outpatient Diabetes medications: Ozempic  0.5 mg weekly Current orders for Inpatient glycemic control: Novolog  0-9 units correction scale TID  Inpatient Diabetes Program Recommendations:   Noted that patient had low blood sugars yesterday of 35-41 mg/dl.   Recommend changing Novolog  correction scale to 0-6 units TID since patient does have CKD and if blood sugars continue to be low.  Marjorie Lunger RN BSN CDE Diabetes Coordinator Pager: [phone]  8am-5pm

## 2023-09-28 NOTE — Progress Notes (Signed)
 Progress Note   Patient: Christina Orozco WGN:[id number] DOB: [birth year] DOA: 09/21/2023     5 DOS: the patient was seen and examined on 09/28/2023     Brief Narrative: 53 y.o. 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   Patient reports developing worsening right-sided weakness involving her arm and leg around 8 AM on 4/11.  She says she has gait imbalance at baseline from prior strokes.  She says she did not fall.  MRI brain without contrast showed 4 mm acute ischemic left pontine infarct.  No associated mass effect or hemorrhage.  Underlying advanced chronic microvascular schema disease with multiple remote lacunar infarct involving bilateral basal ganglia, thalami, and pons.  Multiple chronic microhemorrhages most pronounced about the pons and thalami, most characteristic of poorly controlled hypertension.     Assessment & Plan: Acute ischemic left pontine stroke History of recurrent CVAs with residual right-sided weakness: Weakness continues to improve  Continues to work with PT/OT, who recommend HHPT at DC - appreciate neuro input.  S/p LP 4/15 for concern for vasculitis in light of recurring strokes -- negative (see below for more details)  - stroke likely 2/2 small vessel disease - maximize preventative measures - brilinta /ASA restarted per neuro rec's 4/16   Oozing from LP site: Oozing from the LP site resolved Continue to monitor CBC - likely combination of ASA/brilinta /heparin .   Heparin  currently on hold Continue ASA/brilinta  as bleeding appears to have stopped.  - If recurs, stop antiplatelet.  She is fully able to move BL legs and has no back pain, which is reassuring.    AKI on CKD stage IV: Patient had increase in  her creatinine again today to 3.6 Unclear baseline, but appears to be between 2.8 and possibly as high as 3.1 Nephrologist on board we appreciate input Continue to monitor renal function closely Renal ultrasound showing no acute sonographic abnormalities, no hydronephrosis   Anemia of chronic disease: Monitor H&H We will transfuse as needed to keep hemoglobin greater than 7   Type 2 diabetes: Holding home meds and placed on SSI. CBG well-controlled   Hypertension: Continue Novasc -Continue hydralazine  10 mg IV every 4 hours as needed for BP more than 160/100   Mild intermittent asthma: Stable, continue Singulair  and albuterol  as needed.   Hyperlipidemia: Continue atorvastatin .   Depression/anxiety: Continue fluoxetine .   Constipation: Hold MiraLAX  due to cramping abdominal pain this morning   OSA: Continue CPAP nightly.  DVT prophylaxis: Heparin    Code Status:   Code Status: Full Code   Consults called: Neurology, renal   Subjective:  Patient seen and examined at bedside this morning Denies any acute overnight events She complains of constipation for which lactulose  have been added Renal function slightly better today  Physical examination   Gen: 53 y.o. female in no apparent distress.  Nontoxic Pulm: Non-labored breathing.  Clear to auscultation bilaterally.  CV: Regular rate and rhythm. No murmur, rub, or gallop. No JVD GI: Abdomen soft, non-tender, non-distended, with normoactive bowel sounds. No organomegaly or masses felt. Back:  Nontender at site of LP.  No further bleeding/oozing.  No hematoma palpated.  No bruising noted.  Ext: Warm, no deformities, no pedal edema Skin: No rashes, lesions  Neuro: Alert and oriented.  Strength 4 out of 5 bilateral upper  extremities.  Strength 2 out of 5 right lower extremity. Psych: Calm  Judgement and insight appear normal. Mood & affect appropriate.    Vitals:   09/27/23 2336 09/28/23 0407 09/28/23 0807 09/28/23  1203  BP: (!) 140/81 (!) 151/85 (!) 148/81 (!) 156/83  Pulse: 73 72 80 84  Resp: 16 18 18 18   Temp: 98.1 F (36.7 C) 97.7 F (36.5 C) 98.9 F (37.2 C) 98.1 F (36.7 C)  TempSrc: Oral Oral Oral Oral  SpO2: 100% 100% 100% 100%  Weight:      Height:        Data Reviewed:     Latest Ref Rng & Units 09/28/2023    5:53 AM 09/27/2023    5:00 AM 09/26/2023    6:16 AM  CBC  WBC 4.0 - 10.5 K/uL 4.2  4.7  4.8   Hemoglobin 12.0 - 15.0 g/dL 8.0  7.6  8.3   Hematocrit 36.0 - 46.0 % 24.0  23.1  25.2   Platelets 150 - 400 K/uL 175  187  189        Latest Ref Rng & Units 09/28/2023    5:53 AM 09/27/2023    5:00 AM 09/26/2023    6:16 AM  BMP  Glucose 70 - 99 mg/dL 88  98  811   BUN 6 - 20 mg/dL 61  54  45   Creatinine 0.44 - 1.00 mg/dL 9.14  7.82  9.56   Sodium 135 - 145 mmol/L 136  137  140   Potassium 3.5 - 5.1 mmol/L 4.3  3.8  3.8   Chloride 98 - 111 mmol/L 107  108  107   CO2 22 - 32 mmol/L 21  22  21    Calcium  8.9 - 10.3 mg/dL 9.1  9.0  9.3      Family Communication: No family present at bedside  Disposition: Hopefully discharge home with home health in the next 1 to 2 days pending renal function improvement  Time spent: 41 minutes  Author: Ezzard Holms, MD 09/28/2023 1:26 PM  For on call review [email or web address].

## 2023-09-28 NOTE — Progress Notes (Signed)
 Patient ID: Christina Orozco, female   DOB: 25-Apr-1971, 53 y.o.   MRN: [id number] S: No new complaints O:BP (!) 156/83 (BP Location: Left Arm)   Pulse 84   Temp 98.1 F (36.7 C) (Oral)   Resp 18   Ht 5' 2.5" (1.588 m)   Wt 59.4 kg   LMP 05/22/2018 (Within Days)   SpO2 100%   BMI 23.58 kg/m  No intake or output data in the 24 hours ending 09/28/23 1411 Intake/Output: No intake/output data recorded.  Intake/Output this shift:  No intake/output data recorded. Weight change:  Gen: NAD CVS: RRR Resp:CTA Abd: +BS, soft, NT/ND Ext: no edema  Recent Labs  Lab 09/21/23 1421 09/22/23 0815 09/23/23 1150 09/24/23 0613 09/25/23 0548 09/26/23 0616 09/27/23 0500 09/28/23 0553  NA 142 144 143 143 139 140 137 136  K 4.3 3.8 4.3 4.2 3.8 3.8 3.8 4.3  CL 109 112* 112* 105 105 107 108 107  CO2 21* 22 21* 28 25 21* 22 21*  GLUCOSE 97 102* 101* 103* 99 101* 98 88  BUN 36* 34* 30* 30* 32* 45* 54* 61*  CREATININE 2.84* 2.94* 2.89* 2.81* 2.89* 3.25* 3.59* 3.38*  ALBUMIN 4.0  --   --   --   --   --   --  3.3*  CALCIUM  9.3 9.3 9.6 9.6 9.3 9.3 9.0 9.1  PHOS  --   --   --   --   --   --   --  5.0*  AST 33  --   --   --   --   --   --   --   ALT 35  --   --   --   --   --   --   --    Liver Function Tests: Recent Labs  Lab 09/21/23 1421 09/28/23 0553  AST 33  --   ALT 35  --   ALKPHOS 43  --   BILITOT 0.7  --   PROT 7.3  --   ALBUMIN 4.0 3.3*   Recent Labs  Lab 09/24/23 1128  LIPASE 41   No results for input(s): "AMMONIA" in the last 168 hours. CBC: Recent Labs  Lab 09/21/23 1421 09/22/23 0815 09/26/23 0616 09/27/23 0500 09/28/23 0553  WBC 3.8* 3.9* 4.8 4.7 4.2  NEUTROABS 1.8  --   --   --   --   HGB 8.8* 8.1* 8.3* 7.6* 8.0*  HCT 27.1* 24.5* 25.2* 23.1* 24.0*  MCV 90.9 90.7 90.6 89.5 89.9  PLT 202 201 189 187 175   Cardiac Enzymes: No results for input(s): "CKTOTAL", "CKMB", "CKMBINDEX", "TROPONINI" in the last 168 hours. CBG: Recent Labs  Lab 09/27/23 1953  09/27/23 2014 09/27/23 2140 09/28/23 0622 09/28/23 1204  GLUCAP 41* 77 151* 94 136*    Iron  Studies: No results for input(s): "IRON ", "TIBC", "TRANSFERRIN", "FERRITIN" in the last 72 hours. Studies/Results: US  RENAL Result Date: 09/27/2023 CLINICAL DATA:  [id number].  Acute kidney injury. EXAM: RENAL / URINARY TRACT ULTRASOUND COMPLETE COMPARISON:  CT virtual colonoscopy 09/07/2023 without IV contrast. FINDINGS: Right Kidney: Renal measurements: 7.6 x 5.9 x 5.6 cm = volume: 128.8 mL. Echogenicity within normal limits. No mass or hydronephrosis visualized. There was a 7 mm lower pole caliceal stone within this kidney on the CT but this was not redemonstrated sonographically although is probably still there. Left Kidney: Renal measurements: 9.5 x 5.5 x 5.4 cm = volume: 149.1 mL. Echogenicity within normal limits. No mass or  hydronephrosis visualized. Bladder: Appears normal for degree of bladder distention. Other: None. IMPRESSION: 1. No acute sonographic abnormality. No hydronephrosis. No increased cortical echogenicity. 2. 7 mm lower pole caliceal stone in the right kidney on the CT was not redemonstrated sonographically although is probably still present. Electronically Signed   By: Denman Fischer M.D.   On: 09/27/2023 23:20    amLODipine   10 mg Oral Daily   aspirin  EC  81 mg Oral BID   atorvastatin   80 mg Oral QHS   carvedilol   12.5 mg Oral BID WC   FLUoxetine   20 mg Oral Daily   insulin  aspart  0-6 Units Subcutaneous TID WC   montelukast   10 mg Oral QHS   pantoprazole   40 mg Oral BID   senna-docusate  1 tablet Oral BID   ticagrelor   90 mg Oral BID    BMET    Component Value Date/Time   NA 136 09/28/2023 0553   NA 145 (H) 06/17/2020 1053   K 4.3 09/28/2023 0553   CL 107 09/28/2023 0553   CO2 21 (L) 09/28/2023 0553   GLUCOSE 88 09/28/2023 0553   BUN 61 (H) 09/28/2023 0553   BUN 28 (H) 06/17/2020 1053   CREATININE 3.38 (H) 09/28/2023 0553   CREATININE 1.73 (H) 04/23/2017 0943    CALCIUM  9.1 09/28/2023 0553   GFRNONAA 16 (L) 09/28/2023 0553   GFRNONAA 36 (L) 04/23/2017 0943   GFRAA 27 (L) 06/17/2020 1053   GFRAA 41 (L) 04/23/2017 0943   CBC    Component Value Date/Time   WBC 4.2 09/28/2023 0553   RBC 2.67 (L) 09/28/2023 0553   HGB 8.0 (L) 09/28/2023 0553   HGB 9.8 (L) 08/20/2017 1503   HCT 24.0 (L) 09/28/2023 0553   HCT 29.2 (L) 08/20/2017 1503   PLT 175 09/28/2023 0553   PLT 308 08/20/2017 1503   MCV 89.9 09/28/2023 0553   MCV 84 08/20/2017 1503   MCH 30.0 09/28/2023 0553   MCHC 33.3 09/28/2023 0553   RDW 15.0 09/28/2023 0553   RDW 14.6 08/20/2017 1503   LYMPHSABS 1.7 09/21/2023 1421   LYMPHSABS 2.7 08/20/2017 1503   MONOABS 0.2 09/21/2023 1421   EOSABS 0.1 09/21/2023 1421   EOSABS 0.2 08/20/2017 1503   BASOSABS 0.0 09/21/2023 1421   BASOSABS 0.0 08/20/2017 1503    Assessment/Plan  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  Not far off baseline, but maybe relative hypotension played a part.  No nephrotoxic agents.               - send UA and UP/C             - Renal US  without acute abnormality, no hydronephrosis.              - drink to thirst, avoid NSAIDs             - no need for dialysis at this time             - follows as OP with Dr Nance Aw             - has a history of nephrolithiasis- discussed no teas or sodas   2.  CVA             - L pontine             - per primary/ neuro             - getting PT/OT/SLP             -  on ASA/ statin/ brilinta    3.  HTN:             - well controlled   4.  DM II:             Per primary   5.  Dispo: pending  Benjamin [employer]ands, MD [facility]

## 2023-09-29 ENCOUNTER — Encounter (HOSPITAL_COMMUNITY): Payer: Self-pay

## 2023-09-29 ENCOUNTER — Other Ambulatory Visit (HOSPITAL_COMMUNITY): Payer: Self-pay

## 2023-09-29 DIAGNOSIS — I639 Cerebral infarction, unspecified: Secondary | ICD-10-CM | POA: Diagnosis not present

## 2023-09-29 LAB — GLUCOSE, CAPILLARY
Glucose-Capillary: 95 mg/dL (ref 70–99)
Glucose-Capillary: 103 mg/dL — ABNORMAL HIGH (ref 70–99)
Glucose-Capillary: 95 mg/dL (ref 70–99)

## 2023-09-29 LAB — BASIC METABOLIC PANEL WITH GFR
Anion gap: 7 (ref 5–15)
BUN: 61 mg/dL — ABNORMAL HIGH (ref 6–20)
CO2: 20 mmol/L — ABNORMAL LOW (ref 22–32)
Calcium: 9.1 mg/dL (ref 8.9–10.3)
Chloride: 109 mmol/L (ref 98–111)
Creatinine, Ser: 3.57 mg/dL — ABNORMAL HIGH (ref 0.44–1.00)
GFR, Estimated: 15 mL/min — ABNORMAL LOW (ref >60)
Glucose, Bld: 95 mg/dL (ref 70–99)
Potassium: 4 mmol/L (ref 3.5–5.1)
Sodium: 136 mmol/L (ref 135–145)

## 2023-09-29 LAB — CSF CULTURE W GRAM STAIN
Culture: NO GROWTH
Gram Stain: NONE SEEN

## 2023-09-29 LAB — CBC
HCT: 23.5 % — ABNORMAL LOW (ref 36.0–46.0)
Hemoglobin: 7.6 g/dL — ABNORMAL LOW (ref 12.0–15.0)
MCH: 29.3 pg (ref 26.0–34.0)
MCHC: 32.3 g/dL (ref 30.0–36.0)
MCV: 90.7 fL (ref 80.0–100.0)
Platelets: 169 10*3/uL (ref 150–400)
RBC: 2.59 MIL/uL — ABNORMAL LOW (ref 3.87–5.11)
RDW: 15 % (ref 11.5–15.5)
WBC: 4.6 10*3/uL (ref 4.0–10.5)
nRBC: 0 % (ref 0.0–0.2)

## 2023-09-29 MED ORDER — SODIUM BICARBONATE 650 MG PO TABS
650.0000 mg | ORAL_TABLET | Freq: Three times a day (TID) | ORAL | 0 refills | Status: AC
  Filled 2023-09-29: qty 90, 30d supply, fill #0

## 2023-09-29 MED ORDER — SENNOSIDES-DOCUSATE SODIUM 8.6-50 MG PO TABS
1.0000 | ORAL_TABLET | Freq: Two times a day (BID) | ORAL | 0 refills | Status: AC
  Filled 2023-09-29: qty 28, 14d supply, fill #0

## 2023-09-29 MED ORDER — ASPIRIN 81 MG PO TBEC
81.0000 mg | DELAYED_RELEASE_TABLET | Freq: Two times a day (BID) | ORAL | 0 refills | Status: DC
  Filled 2023-09-29: qty 120, 60d supply, fill #0

## 2023-09-29 MED ORDER — TICAGRELOR 90 MG PO TABS
90.0000 mg | ORAL_TABLET | Freq: Two times a day (BID) | ORAL | 0 refills | Status: DC
  Filled 2023-09-29: qty 60, 30d supply, fill #0

## 2023-09-29 NOTE — Progress Notes (Signed)
 Reviewed AVS, patient expressed understanding of medications, MD follow up reviewed.   Removed IV, Site clean, dry and intact.  Patient states all belongings brought to the hospital at time of admission are accounted for and packed to take home.  Pt transported to entrance A where family member was waiting in vehicle to transport home.

## 2023-09-29 NOTE — Progress Notes (Signed)
 Occupational Therapy Treatment Patient Details Name: Christina Orozco MRN: [id number] DOB: Sep 09, 1970 Today's Date: 09/29/2023   History of present illness Pt is 53 yo female admitted on 09/21/23 with R sided weakness found to have L pontine infarct.  Pt with hx including but not limited to DM, HTN, CKD, anemia, PVD, recurrent CVAs (pt reports one every other month in 2024)   OT comments  Pt. Seen for skilled OT treatment session.  Pt. Eager and motivated for participation.  Able to complete bed mobility MOD I.  LB dressing seated with set up.  In room ambulation with CGA for toileting and standing grooming task.  Good demo of rollator management including using the brakes without cues.  Cont. With acute OT POC.        If plan is discharge home, recommend the following:  A little help with walking and/or transfers;A little help with bathing/dressing/bathroom;Assistance with cooking/housework;Assist for transportation;Help with stairs or ramp for entrance   Equipment Recommendations  None recommended by OT    Recommendations for Other Services      Precautions / Restrictions Precautions Precautions: Fall Recall of Precautions/Restrictions: Intact       Mobility Bed Mobility   Bed Mobility: Supine to Sit, Sit to Supine     Supine to sit: Modified independent (Device/Increase time) Sit to supine: Modified independent (Device/Increase time)   General bed mobility comments: increased time to come to EOB, doing so without physical assist    Transfers Overall transfer level: Modified independent Equipment used: Rollator (4 wheels) Transfers: Sit to/from Stand Sit to Stand: Modified independent (Device/Increase time)           General transfer comment: Good hand placement, no need for cues.     Balance                                           ADL either performed or assessed with clinical judgement   ADL Overall ADL's : Needs assistance/impaired      Grooming: Contact guard assist;Standing;Oral care;Wash/dry face Grooming Details (indicate cue type and reason): pt. using BUEs today during tasks without cueing             Lower Body Dressing: Set up;Cueing for sequencing;Sitting/lateral leans Lower Body Dressing Details (indicate cue type and reason): able to figure 4 to reach BLEs for don/doff shoes Toilet Transfer: Contact guard assist;Ambulation;Rollator (4 wheels)   Toileting- Clothing Manipulation and Hygiene: Set up;Sitting/lateral lean       Functional mobility during ADLs: Minimal assistance;Rollator (4 wheels) General ADL Comments: has her personal rollator in room and used during ambulation.  good management of brakes    Extremity/Trunk Assessment              Vision       Perception     Praxis     Communication Communication Communication: No apparent difficulties   Cognition Arousal: Alert Behavior During Therapy: WFL for tasks assessed/performed Cognition: No family/caregiver present to determine baseline             OT - Cognition Comments: FCs appropriately, engaged throughout session                 Following commands: Intact        Cueing   Cueing Techniques: Verbal cues  Exercises      Shoulder Instructions  General Comments      Pertinent Vitals/ Pain       Pain Assessment Pain Assessment: No/denies pain  Home Living                                          Prior Functioning/Environment              Frequency  Min 2X/week        Progress Toward Goals  OT Goals(current goals can now be found in the care plan section)  Progress towards OT goals: Progressing toward goals     Plan      Co-evaluation                 AM-PAC OT "6 Clicks" Daily Activity     Outcome Measure   Help from another person eating meals?: None Help from another person taking care of personal grooming?: A Little Help from another person  toileting, which includes using toliet, bedpan, or urinal?: A Little Help from another person bathing (including washing, rinsing, drying)?: A Little Help from another person to put on and taking off regular upper body clothing?: A Little Help from another person to put on and taking off regular lower body clothing?: A Little 6 Click Score: 19    End of Session Equipment Utilized During Treatment: Gait belt;Rollator (4 wheels)  OT Visit Diagnosis: Unsteadiness on feet (R26.81);Muscle weakness (generalized) (M62.81);Low vision, both eyes (H54.2);Pain Pain - Right/Left: Right Pain - part of body: Knee   Activity Tolerance Patient tolerated treatment well   Patient Left in bed;with call bell/phone within reach;with bed alarm set   Nurse Communication Other (comment) (secure chat session details and updates)        Time: 1610-9604 OT Time Calculation (min): 42 min  Charges: OT General Charges $OT Visit: 1 Visit OT Treatments $Self Care/Home Management : 38-52 mins  Howell Macintosh, COTA/L Acute Rehabilitation [phone]   Leory Rands [place]-COTA/L  09/29/2023, 10:47 AM

## 2023-09-29 NOTE — Progress Notes (Signed)
 Physical Therapy Treatment Patient Details Name: Christina Orozco MRN: [id number] DOB: Jan 21, 1971 Today's Date: 09/29/2023   History of Present Illness Pt is 53 yo female admitted on 09/21/23 with R sided weakness found to have L pontine infarct.  Pt with hx including but not limited to DM, HTN, CKD, anemia, PVD, recurrent CVAs (pt reports one every other month in 2024)    PT Comments  Pt continues to progress towards goals.  Mod I for bed mobility and sit to stand this session using rollator. Pt was supervision for gait with cues to prevent hyperextension at the R knee with improvement 50% of the time with intermittent cues after initial training. Due to pt current functional status, home set up and available assistance at home recommending skilled physical therapy services 3x/week in order to address strength, balance and functional mobility to decrease risk for falls, injury and re-hospitalization.      If plan is discharge home, recommend the following: A little help with walking and/or transfers;Assistance with cooking/housework;Help with stairs or ramp for entrance     Equipment Recommendations  None recommended by PT       Precautions / Restrictions Precautions Precautions: Fall Recall of Precautions/Restrictions: Intact Restrictions Weight Bearing Restrictions Per Provider Order: No     Mobility  Bed Mobility Overal bed mobility: Modified Independent Bed Mobility: Supine to Sit, Sit to Supine     Supine to sit: Modified independent (Device/Increase time) Sit to supine: Modified independent (Device/Increase time)   General bed mobility comments: increased time to come to EOB, doing so without physical assist    Transfers Overall transfer level: Modified independent Equipment used: Rollator (4 wheels) Transfers: Sit to/from Stand Sit to Stand: Modified independent (Device/Increase time)           General transfer comment: Good hand placement, no need for cues.     Ambulation/Gait Ambulation/Gait assistance: Contact guard assist, Supervision Gait Distance (Feet): 250 Feet Assistive device: Rollator (4 wheels) Gait Pattern/deviations: Step-to pattern, Decreased stride length, Decreased dorsiflexion - right, Knee hyperextension - right, Narrow base of support, Ataxic, Steppage Gait velocity: decreased Gait velocity interpretation: 1.31 - 2.62 ft/sec, indicative of limited community ambulator   General Gait Details: improved gait pattern with less hyperextension at th RLE this session. Pt Hyperextension improved significantly with light tactile cues at popliteal space pt was able to contine intermittently ~50% of the time with decreased hyperextension with intermittent tactile cues    Modified Rankin (Stroke Patients Only) Modified Rankin (Stroke Patients Only) Pre-Morbid Rankin Score: Slight disability Modified Rankin: Moderate disability     Balance Overall balance assessment: Needs assistance Sitting-balance support: No upper extremity supported Sitting balance-Leahy Scale: Good     Standing balance support: Bilateral upper extremity supported, Reliant on assistive device for balance Standing balance-Leahy Scale: Fair Standing balance comment: reliant on AD for external support        Communication Communication Communication: No apparent difficulties  Cognition Arousal: Alert Behavior During Therapy: WFL for tasks assessed/performed          General Comments General comments (skin integrity, edema, etc.): no signs/symptoms of cardiac/respiratory distress throughout session      Pertinent Vitals/Pain Pain Assessment Pain Assessment: No/denies pain     PT Goals (current goals can now be found in the care plan section) Acute Rehab PT Goals Patient Stated Goal: return home PT Goal Formulation: With patient Time For Goal Achievement: 10/06/23 Potential to Achieve Goals: Good Additional Goals Additional Goal #1: Pt will  control  R knee hyperextension during gait for safer gait pattern Progress towards PT goals: Progressing toward goals    Frequency    Min 2X/week      PT Plan  Continue with current POC        AM-PAC PT "6 Clicks" Mobility   Outcome Measure  Help needed turning from your back to your side while in a flat bed without using bedrails?: None Help needed moving from lying on your back to sitting on the side of a flat bed without using bedrails?: None Help needed moving to and from a bed to a chair (including a wheelchair)?: None Help needed standing up from a chair using your arms (e.g., wheelchair or bedside chair)?: None Help needed to walk in hospital room?: A Little Help needed climbing 3-5 steps with a railing? : A Little 6 Click Score: 22    End of Session Equipment Utilized During Treatment: Gait belt Activity Tolerance: Patient tolerated treatment well Patient left: in bed;with call bell/phone within reach;with bed alarm set Nurse Communication: Mobility status PT Visit Diagnosis: Other abnormalities of gait and mobility (R26.89);Hemiplegia and hemiparesis Hemiplegia - Right/Left: Right Hemiplegia - dominant/non-dominant: Dominant Hemiplegia - caused by: Cerebral infarction     Time: 1208-1231 PT Time Calculation (min) (ACUTE ONLY): 23 min  Charges:    $Gait Training: 8-22 mins $Therapeutic Activity: 8-22 mins PT General Charges $$ ACUTE PT VISIT: 1 Visit                    Sloan Duncans, DPT, CLT  Acute Rehabilitation Services Office: [phone] (Secure chat preferred)    Jenice Mitts 09/29/2023, 12:36 PM

## 2023-09-29 NOTE — Discharge Summary (Signed)
 Physician Discharge Summary  Kele Withem VZD:[id number] DOB: 07-Apr-1971 DOA: 09/21/2023  PCP: Hampton Levins, PA-C  Admit date: 09/21/2023 Discharge date: 09/29/2023  Admitted From: Home Disposition:  Home  Discharge Condition:Stable CODE STATUS:FULL Diet recommendation: Heart Healthy  Brief/Interim Summary: Patient is a 53 y.o. 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 No associated mass effect or hemorrhage. Underlying advanced chronic microvascular schema disease with multiple remote lacunar infarct involving bilateral basal ganglia, thalami, and pons. Multiple chronic microhemorrhages most pronounced about the pons and thalami, most characteristic of poorly controlled hypertension .  Neurology was consulted.  Stroke workup initiated and completed.  Hospital course also remarkable for AKI on CKD, now improving.  Nephrology cleared for discharge.  PT recommending home with on discharge.  Medically stable for discharge home today.    Following problems were addressed during the hospitalization:  Acute ischemic left pontine stroke History of recurrent CVAs with residual right-sided weakness: Weakness continues to improve  Continues to work with PT/OT, who recommend HHPT at [place] - appreciate neuro input.  S/p LP 4/15 for concern for vasculitis in light of recurring strokes -- negative (see below for more details)  - stroke likely 2/2 small vessel disease - maximize preventative measures - brilinta /ASA restarted per neuro rec's 4/16 - She will follow-up with his own neurologist at [facility] as  an outpatient   Oozing from LP site: Oozing from the LP site resolved   AKI on CKD stage IV: Unclear baseline, but appears to be between 2.8 and possibly as high as 3.1 Nephrologist was following.  Nephrology cleared for discharge.  She follows with Dr. Carlen Chasten at [facility].  We recommend to follow-up with her nephrologist in a week Renal ultrasound showing no acute sonographic abnormalities, no hydronephrosis   Anemia of chronic disease: Hemoglobin stable in the range of 7-8.  Check CBC in a week by following with PCP or nephrologist   Type 2 diabetes: Continue home med   Hypertension: Continue Novasc,Coreg    Mild intermittent asthma: Stable, continue Singulair  and albuterol  as needed.   Hyperlipidemia: Continue atorvastatin .   Depression/anxiety: Continue fluoxetine .   Constipation: Continue senokot   OSA: Continue CPAP nightly.   Discharge Diagnoses:  Principal Problem:   Left pontine stroke (HCC) Active Problems:   CKD (chronic kidney disease), stage IV (HCC)   Type 2 diabetes mellitus with chronic kidney disease, without long-term current use of insulin  (HCC)   OSA (obstructive sleep apnea)   Hypertension associated with diabetes (HCC)   Mild intermittent asthma   Hyperlipidemia associated with type 2 diabetes mellitus (HCC)   Anemia, chronic disease    Discharge Instructions  Discharge Instructions     Ambulatory referral to Neurology   Complete by: As directed    Follow up with Dr. Loretha Romney at [facility] in 4 weeks. Thanks   Diet - low sodium heart healthy   Complete by: As directed    Discharge instructions   Complete by: As directed    1)Please take prescribed medications as instructed 2)Follow up with your PCP and nephrologist as an outpatient in a week.  Do a BMP test to check your kidney function during the follow-up 3)Follow  up with your  neurologist in 4 weeks.   Increase activity slowly   Complete by: As directed        Allergies as of 09/29/2023       Reactions   Lisinopril  Swelling, Other (See Comments)   Facial swelling  angioedema   Penicillins Hives   Has patient had a PCN reaction causing immediate rash, facial/tongue/throat swelling, SOB or lightheadedness with hypotension: yes Has patient had a PCN reaction causing severe rash involving mucus membranes or skin necrosis: No Has patient had a PCN reaction that required hospitalization No Has patient had a PCN reaction occurring within the last 10 years: No If all of the above answers are "NO", then may proceed wit Rash on buttock that makes sitting very difficult   Quetiapine Other (See Comments)   Unknown    Gabapentin  Swelling, Other (See Comments)   "I thought I was having another stroke." dizziness        Medication List     TAKE these medications    albuterol  108 (90 Base) MCG/ACT inhaler Commonly known as: ProAir  HFA INHALE 1 OR 2 PUFFS INTO THE LUNGS EVERY 6 HOURS AS NEEDED FOR WHEEZING OR SHORTNESS OF BREATH   amLODipine  10 MG tablet Commonly known as: NORVASC  TAKE 1 TABLET (10 MG TOTAL) BY MOUTH DAILY. (AM)   aspirin  EC 81 MG tablet Take 1 tablet (81 mg total) by mouth 2 (two) times daily. Swallow whole.   atorvastatin  80 MG tablet Commonly known as: LIPITOR  Take 1 tablet (80 mg total) by mouth at bedtime.   carvedilol  12.5 MG tablet Commonly known as: COREG  Take 1 tablet (12.5 mg total) by mouth 2 (two) times daily with a meal.   FLUoxetine  20 MG capsule Commonly known as: PROZAC  Take 20 mg by mouth daily.   fluticasone  50 MCG/ACT nasal spray Commonly known as: FLONASE  Place 2 sprays into both nostrils daily as needed for allergies or rhinitis.   melatonin 5 MG Tabs Take 1 tablet (5 mg total) by mouth at bedtime as needed.   montelukast  10 MG tablet Commonly known as: SINGULAIR  Take 10 mg by mouth at bedtime.   naphazoline-glycerin  0.012-0.25 % Soln Commonly known as: CLEAR EYES REDNESS Place 1-2 drops  into the left eye 2 (two) times daily.   Ozempic  (0.25 or 0.5 MG/DOSE) 2 MG/3ML Sopn Generic drug: Semaglutide (0.25 or 0.5MG /DOS) Inject 0.5 mg into the skin See admin instructions. Inject 0.5 mg subcutaneous every 10 days   pantoprazole  40 MG tablet Commonly known as: PROTONIX  Take 1 tablet (40 mg total) by mouth 2 (two) times daily.   senna-docusate 8.6-50 MG tablet Commonly known as: Senokot-S Take 1 tablet by mouth 2 (two) times daily.   sodium bicarbonate  650 MG tablet Take 1 tablet (650 mg total) by mouth 3 (three) times daily.   ticagrelor  90 MG Tabs tablet Commonly known as: BRILINTA  Take 1 tablet (90 mg total) by mouth 2 (two) times daily.        Follow-up Information     [facility] Follow up.   Specialty: Home Health Services Why: Gasper Karst will contact you for the first home visit Contact information: [address] [place] [address] [phone]         Rosezella Conte, MD. Schedule an appointment as soon as possible for a visit in 1 month(s).   Specialty: Hospitalist Contact information: [address] [place] [address] [phone]         Lufadeju,  Adetoye, MD. Schedule an appointment as soon as possible for a visit in 1 week(s).   Specialty: Nephrology Contact information: [address] [place] [address] [phone]                Allergies  Allergen Reactions   Lisinopril  Swelling and Other (See Comments)    Facial swelling  angioedema   Penicillins Hives    Has patient had a PCN reaction causing immediate rash, facial/tongue/throat swelling, SOB or lightheadedness with hypotension: yes Has patient had a PCN reaction causing severe rash involving mucus membranes or skin necrosis: No Has patient had a PCN reaction that required hospitalization No Has patient had a PCN reaction occurring within the last 10 years: No If all of the above answers are "NO", then may proceed  wit Rash on buttock that makes sitting very difficult   Quetiapine Other (See Comments)    Unknown    Gabapentin  Swelling and Other (See Comments)    "I thought I was having another stroke." dizziness    Consultations: Nephrology, neurology   Procedures/Studies: US  RENAL Result Date: 09/27/2023 CLINICAL DATA:  [id number].  Acute kidney injury. EXAM: RENAL / URINARY TRACT ULTRASOUND COMPLETE COMPARISON:  CT virtual colonoscopy 09/07/2023 without IV contrast. FINDINGS: Right Kidney: Renal measurements: 7.6 x 5.9 x 5.6 cm = volume: 128.8 mL. Echogenicity within normal limits. No mass or hydronephrosis visualized. There was a 7 mm lower pole caliceal stone within this kidney on the CT but this was not redemonstrated sonographically although is probably still there. Left Kidney: Renal measurements: 9.5 x 5.5 x 5.4 cm = volume: 149.1 mL. Echogenicity within normal limits. No mass or hydronephrosis visualized. Bladder: Appears normal for degree of bladder distention. Other: None. IMPRESSION: 1. No acute sonographic abnormality. No hydronephrosis. No increased cortical echogenicity. 2. 7 mm lower pole caliceal stone in the right kidney on the CT was not redemonstrated sonographically although is probably still present. Electronically Signed   By: Denman Fischer M.D.   On: 09/27/2023 23:20   DG Abd 1 View Result Date: 09/24/2023 CLINICAL DATA:  Acute abdominal pain.  Constipation. EXAM: ABDOMEN - 1 VIEW COMPARISON:  Radiographs 09/21/2023 and 03/26/2018. CT virtual colonoscopy 09/07/2023. FINDINGS: Single supine view the abdomen demonstrates a normal nonobstructive bowel gas pattern. Colonic stool burden within physiologic limits, improved from previous study. Right renal calculus noted. No acute osseous findings are seen. IMPRESSION: No evidence of bowel obstruction or other acute process. Right renal calculus. Electronically Signed   By: Elmon Hagedorn M.D.   On: 09/24/2023 10:17   VAS US  CAROTID (at [facility] and  [facility] only) Result Date: 09/23/2023 Carotid Arterial Duplex Study Patient Name:  QUINISHA MOULD  Date of Exam:   09/23/2023 Medical Rec #: [id number]         Accession #:    [id number] Date of Birth: November 10, 1970        Patient Gender: F Patient Age:   36 years Exam Location:  [facility] Procedure:      VAS US  CAROTID Referring Phys: Ammon Kanaris PATEL --------------------------------------------------------------------------------  Indications:       Recurrent CVA and Weakness. Risk Factors:      Hypertension, hyperlipidemia, Diabetes, PAD. Other Factors:     CKD IV, OSA on CPAP. Comparison Study:  Prior normal carotid duplex done 06/21/22 Performing Technologist: Carleene Chase RVS  Examination Guidelines: A complete evaluation includes B-mode imaging, spectral Doppler, color Doppler, and power Doppler as needed of all accessible portions of  each vessel. Bilateral testing is considered an integral part of a complete examination. Limited examinations for reoccurring indications may be performed as noted.  Right Carotid Findings: +----------+--------+--------+--------+------------------+------------------+           PSV cm/sEDV cm/sStenosisPlaque DescriptionComments           +----------+--------+--------+--------+------------------+------------------+ CCA Prox  79      18                                intimal thickening +----------+--------+--------+--------+------------------+------------------+ CCA Distal55      12                                intimal thickening +----------+--------+--------+--------+------------------+------------------+ ICA Prox  64      18                                                   +----------+--------+--------+--------+------------------+------------------+ ICA Mid   85      23                                                   +----------+--------+--------+--------+------------------+------------------+ ICA Distal129     33                                                    +----------+--------+--------+--------+------------------+------------------+ ECA       61      9                                                    +----------+--------+--------+--------+------------------+------------------+ +----------+--------+-------+--------+-------------------+           PSV cm/sEDV cmsDescribeArm Pressure (mmHG) +----------+--------+-------+--------+-------------------+ [id number]                                        +----------+--------+-------+--------+-------------------+ +---------+--------+--+--------+--+ VertebralPSV cm/s45EDV cm/s11 +---------+--------+--+--------+--+  Left Carotid Findings: +----------+--------+--------+--------+------------------+------------------+           PSV cm/sEDV cm/sStenosisPlaque DescriptionComments           +----------+--------+--------+--------+------------------+------------------+ CCA Prox  75      18                                intimal thickening +----------+--------+--------+--------+------------------+------------------+ CCA Mid                           calcific                             +----------+--------+--------+--------+------------------+------------------+ CCA Distal63      18  intimal thickening +----------+--------+--------+--------+------------------+------------------+ ICA Prox  72      19              focal and calcific                   +----------+--------+--------+--------+------------------+------------------+ ICA Mid   99      27              homogeneous                          +----------+--------+--------+--------+------------------+------------------+ ICA Distal99      31              homogeneous                          +----------+--------+--------+--------+------------------+------------------+ ECA       59      17                                                    +----------+--------+--------+--------+------------------+------------------+ +----------+--------+--------+--------+-------------------+           PSV cm/sEDV cm/sDescribeArm Pressure (mmHG) +----------+--------+--------+--------+-------------------+ [id number]                                          +----------+--------+--------+--------+-------------------+ +---------+--------+--+--------+--+ VertebralPSV cm/s60EDV cm/s20 +---------+--------+--+--------+--+   Summary: Right Carotid: The extracranial vessels were near-normal with only minimal wall                thickening or plaque. Left Carotid: The extracranial vessels were near-normal with only minimal wall               thickening or plaque. Vertebrals:  Bilateral vertebral arteries demonstrate antegrade flow. Subclavians: Normal flow hemodynamics were seen in bilateral subclavian              arteries. *See table(s) above for measurements and observations.  Electronically signed by Consuelo Denmark MD on 09/23/2023 at 5:30:47 PM.    Final    VAS US  TRANSCRANIAL DOPPLER W BUBBLES Result Date: 09/23/2023  Transcranial Doppler with Bubble Patient Name:  LISL SLINGERLAND  Date of Exam:   09/23/2023 Medical Rec #: [id number]         Accession #:    [id number] Date of Birth: 1970/07/31        Patient Gender: F Patient Age:   28 years Exam [facility]:  [facility] Procedure:      VAS US  TRANSCRANIAL DOPPLER W BUBBLES Referring Phys: Fraser Jackson XU --------------------------------------------------------------------------------  Indications: Stroke. History: Recurrent stroke, hypertension, hyperlipidemia, diabetes, PVD, CKD 4, seizure, OSA on CPAP. Comparison Study: No prior study Performing Technologist: Carleene Chase RVS  Examination Guidelines: A complete evaluation includes B-mode imaging, spectral Doppler, color Doppler, and power Doppler as needed of all accessible portions of each vessel. Bilateral testing is considered an integral part of a  complete examination. Limited examinations for reoccurring indications may be performed as noted.  Summary:  A vascular evaluation was performed. The right middle cerebral artery was studied. An IV was inserted into the patient's left AC. Verbal informed consent was obtained.  No HITS noted at rest. Spencer Grade 1 noted with Valsalva maneuver (less than 10  HITS), likely clinically insignificant.  *See table(s) above for TCD measurements and observations.  Diagnosing physician: Consuelo Denmark MD Electronically signed by Consuelo Denmark MD on 09/23/2023 at 5:30:20 PM.    Final    ECHOCARDIOGRAM COMPLETE Result Date: 09/22/2023    ECHOCARDIOGRAM REPORT   Patient Name:   TASHE PURDON Date of Exam: 09/22/2023 Medical Rec #:  [id number]        Height:       62.5 in Accession #:    [id number]       Weight:       131.0 lb Date of Birth:  [birth year]       BSA:          1.607 m Patient Age:    52 years         BP:           154/78 mmHg Patient Gender: F                HR:           74 bpm. Exam Location:  Inpatient Procedure: 2D Echo, Cardiac Doppler and Color Doppler (Both Spectral and Color            Flow Doppler were utilized during procedure). Indications:    Stroke I63.9  History:        Patient has prior history of Echocardiogram examinations, most                 recent 08/23/2022. Stroke, TIA and CKD, stage 4,                 Signs/Symptoms:Shortness of Breath; Risk Factors:Hypertension,                 Diabetes and Sleep Apnea.  Sonographer:    Terrilee Few RCS Referring Phys: [id number] VISHAL R PATEL IMPRESSIONS  1. Left ventricular ejection fraction, by estimation, is 55 to 60%. The left ventricle has normal function. The left ventricle has no regional wall motion abnormalities. There is mild concentric left ventricular hypertrophy. Left ventricular diastolic parameters were normal.  2. Right ventricular systolic function is normal. The right ventricular size is normal. There is normal pulmonary artery systolic  pressure. The estimated right ventricular systolic pressure is 19.0 mmHg.  3. A small pericardial effusion is present. The pericardial effusion is circumferential. There is no evidence of cardiac tamponade.  4. The mitral valve is normal in structure. Trivial mitral valve regurgitation. No evidence of mitral stenosis.  5. The aortic valve is tricuspid. Aortic valve regurgitation is not visualized. No aortic stenosis is present.  6. The inferior vena cava is normal in size with greater than 50% respiratory variability, suggesting right atrial pressure of 3 mmHg. FINDINGS  Left Ventricle: Left ventricular ejection fraction, by estimation, is 55 to 60%. The left ventricle has normal function. The left ventricle has no regional wall motion abnormalities. The left ventricular internal cavity size was normal in size. There is  mild concentric left ventricular hypertrophy. Left ventricular diastolic parameters were normal. Right Ventricle: The right ventricular size is normal. No increase in right ventricular wall thickness. Right ventricular systolic function is normal. There is normal pulmonary artery systolic pressure. The tricuspid regurgitant velocity is 2.00 m/s, and  with an assumed right atrial pressure of 3 mmHg, the estimated right ventricular systolic pressure is 19.0 mmHg. Left Atrium: Left atrial size was normal in size. Right Atrium: Right atrial size was normal in size. Pericardium: A small pericardial effusion  is present. The pericardial effusion is circumferential. There is no evidence of cardiac tamponade. Mitral Valve: The mitral valve is normal in structure. Trivial mitral valve regurgitation. No evidence of mitral valve stenosis. Tricuspid Valve: The tricuspid valve is normal in structure. Tricuspid valve regurgitation is trivial. Aortic Valve: The aortic valve is tricuspid. Aortic valve regurgitation is not visualized. No aortic stenosis is present. Aortic valve peak gradient measures 6.5 mmHg.  Pulmonic Valve: The pulmonic valve was grossly normal. Pulmonic valve regurgitation is not visualized. No evidence of pulmonic stenosis. Aorta: The aortic root and ascending aorta are structurally normal, with no evidence of dilitation. Venous: The inferior vena cava is normal in size with greater than 50% respiratory variability, suggesting right atrial pressure of 3 mmHg. IAS/Shunts: No atrial level shunt detected by color flow Doppler.  LEFT VENTRICLE PLAX 2D LVIDd:         4.30 cm   Diastology LVIDs:         2.50 cm   LV e' medial:    8.49 cm/s LV PW:         1.40 cm   LV E/e' medial:  9.2 LV IVS:        1.30 cm   LV e' lateral:   12.80 cm/s LVOT diam:     2.10 cm   LV E/e' lateral: 6.1 LV SV:         57 LV SV Index:   36 LVOT Area:     3.46 cm  RIGHT VENTRICLE             IVC RV S prime:     12.30 cm/s  IVC diam: 1.90 cm TAPSE (M-mode): 1.9 cm LEFT ATRIUM             Index        RIGHT ATRIUM           Index LA diam:        3.00 cm 1.87 cm/m   RA Area:     10.00 cm LA Vol (A2C):   32.1 ml 19.98 ml/m  RA Volume:   19.70 ml  12.26 ml/m LA Vol (A4C):   39.8 ml 24.77 ml/m LA Biplane Vol: 38.5 ml 23.96 ml/m  AORTIC VALVE AV Area (Vmax): 2.30 cm AV Vmax:        127.00 cm/s AV Peak Grad:   6.5 mmHg LVOT Vmax:      84.50 cm/s LVOT Vmean:     55.300 cm/s LVOT VTI:       0.166 m  AORTA Ao Root diam: 3.00 cm Ao Asc diam:  2.80 cm MITRAL VALVE               TRICUSPID VALVE MV Area (PHT): 4.31 cm    TR Peak grad:   16.0 mmHg MV Decel Time: 176 msec    TR Vmax:        200.00 cm/s MV E velocity: 77.70 cm/s MV A velocity: 91.60 cm/s  SHUNTS MV E/A ratio:  0.85        Systemic VTI:  0.17 m                            Systemic Diam: 2.10 cm Karyl Paget Croitoru MD Electronically signed by Luana Rumple MD Signature Date/Time: 09/22/2023/4:53:23 PM    Final    MR ANGIO HEAD WO CONTRAST Result Date: 09/22/2023 CLINICAL DATA:  Follow-up examination for stroke. EXAM: MRA HEAD  WITHOUT CONTRAST TECHNIQUE: Angiographic images of  the Circle of Willis were acquired using MRA technique without intravenous contrast. COMPARISON:  Prior MRI from 09/21/2023. FINDINGS: Anterior circulation: Both internal carotid arteries widely patent to the termini without stenosis. A1 segments widely patent. Normal anterior communicating artery complex. Both anterior cerebral arteries widely patent to their distal aspects without stenosis. No M1 stenosis or occlusion. Normal MCA bifurcations. Distal MCA branches well perfused and symmetric. Posterior circulation: Visualized distal V4 segments widely patent. Left vertebral artery dominant. Left PICA patent. Right PICA not seen. Basilar patent without stenosis. Superior cerebellar and posterior cerebral arteries patent bilaterally. Anatomic variants: As above. Other: No intracranial aneurysm. Small lipoma noted at the right frontal scalp. IMPRESSION: Normal intracranial MRA. Electronically Signed   By: Virgia Griffins M.D.   On: 09/22/2023 02:55   MR Cervical Spine Wo Contrast Result Date: 09/21/2023 CLINICAL DATA:  Initial evaluation for acute neck pain. EXAM: MRI CERVICAL SPINE WITHOUT CONTRAST TECHNIQUE: Multiplanar, multisequence MR imaging of the cervical spine was performed. No intravenous contrast was administered. COMPARISON:  And MRI from 07/05/2015. FINDINGS: Alignment: Straightening with mild smooth reversal of the normal cervical lordosis. No significant listhesis. Vertebrae: Prior ACDF at C5-6 with solid arthrodesis. Vertebral body height maintained without acute or chronic fracture. Atypical hemangioma noted within the T1 vertebral body. No other worrisome osseous lesions. Degenerative reactive endplate change with marrow edema present about the C3-4 interspace. No other abnormal marrow edema. Cord: Normal signal and morphology. Posterior Fossa, vertebral arteries, paraspinal tissues: Changes of severe chronic microvascular ischemic disease noted within the pons. Craniocervical junction within  normal limits. Paraspinous soft tissues within normal limits. Normal flow voids seen within the vertebral arteries bilaterally. Disc levels: C2-C3: Unremarkable. C3-C4: Degenerative intervertebral disc space narrowing with diffuse disc osteophyte complex. Broad posterior component flattens and effaces the ventral thecal sac. Mild cord flattening without cord signal changes. Mild to moderate spinal stenosis. Foramina remain patent. C4-C5: Small central disc protrusion indents the ventral thecal sac. No significant spinal stenosis. Mild uncovertebral spurring without significant foraminal encroachment. C5-C6: Prior fusion. No residual spinal stenosis. Foramina appear patent. C6-C7: Mild disc bulge with uncovertebral spurring. No spinal stenosis. Foramina remain patent. C7-T1: Normal interspace. Minimal right-sided facet hypertrophy. No canal or foraminal stenosis. IMPRESSION: 1. Degenerative disc osteophyte complex at C3-4 with resultant mild to moderate spinal stenosis. Degenerative reactive endplate change with marrow edema at this level could contribute to neck pain. 2. Prior ACDF at C5-6 without residual or recurrent stenosis. 3. Small central disc protrusion at C4-5 without significant stenosis. Electronically Signed   By: Virgia Griffins M.D.   On: 09/21/2023 21:57   MR BRAIN WO CONTRAST Result Date: 09/21/2023 CLINICAL DATA:  Initial evaluation for acute neuro deficit, right-sided symptoms. EXAM: MRI HEAD WITHOUT CONTRAST TECHNIQUE: Multiplanar, multiecho pulse sequences of the brain and surrounding structures were obtained without intravenous contrast. COMPARISON:  Comparison made with prior CT from earlier the same day as well as previous MRI from 04/14/2023 FINDINGS: Brain: Generalized cerebral atrophy, most pronounced about the brainstem, advanced for age. Extensive T2/FLAIR hyperintensity involving the periventricular deep white matter both cerebral hemispheres, with patchy involvement of the deep  gray nuclei, brainstem, and cerebellum, most likely related to advanced chronic microvascular ischemic disease. Multiple remote lacunar infarcts present about the bilateral basal ganglia, thalami, and pons. 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 No associated mass effect or convincing associated hemorrhage, although evaluation limited as there is multiple chronic micro hemorrhages  at this location. No other evidence for acute or subacute ischemia. Gray-white matter differentiation otherwise maintained. No acute intracranial hemorrhage. Multiple chronic micro hemorrhages noted, most pronounced about the pons and thalami, most characteristic of chronic poorly controlled hypertension. No mass lesion, midline shift or mass effect. No hydrocephalus or extra-axial fluid collection. Pituitary gland within normal limits. Vascular: Major intracranial vascular flow voids are maintained. Skull and upper cervical spine: Craniocervical junction within normal limits. Bone marrow signal intensity normal. No scalp soft tissue abnormality. Sinuses/Orbits: Prior bilateral ocular lens replacement. Paranasal sinuses are clear. No significant mastoid effusion. Other: None. IMPRESSION: 1. 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 No associated mass effect or hemorrhage. 2. Underlying advanced chronic microvascular ischemic disease, with multiple remote lacunar infarcts about the bilateral basal ganglia, thalami, and pons. 3. Multiple chronic micro hemorrhages, most pronounced about the pons and thalami, most characteristic of chronic poorly controlled hypertension. Electronically Signed   By: Virgia Griffins M.D.   On: 09/21/2023 21:51   CT Head Wo Contrast Result Date: 09/21/2023 CLINICAL DATA:  Right-sided weakness worse than baseline since waking up at 8 a.m. today. EXAM: CT HEAD WITHOUT CONTRAST TECHNIQUE: Contiguous axial images were obtained from the base of the skull through the vertex without  intravenous contrast. RADIATION DOSE REDUCTION: This exam was performed according to the departmental dose-optimization program which includes automated exposure control, adjustment of the mA and/or kV according to patient size and/or use of iterative reconstruction technique. COMPARISON:  CT head without contrast 04/14/2023. MRI head 05/28/2023. FINDINGS: Brain: Multiple remote lacunar infarcts are again noted in the thalami bilaterally. Extensive periventricular white matter changes are present bilaterally. No acute infarct, hemorrhage, or mass lesion is present. Moderate confluent white matter hypoattenuation is stable. Basal ganglia are otherwise within normal limits. The insular ribbon is normal bilaterally. No acute or focal cortical abnormality is present. Extensive white matter changes are present the brainstem and right greater than left cerebellar peduncles, similar the prior exam. No acute infarct or hemorrhage is present within the posterior fossa. Midline structures are within normal limits. Vascular: Atherosclerotic calcifications are present within the cavernous internal carotid arteries bilaterally. No hyperdense vessel is present. Skull: Calvarium is intact. No focal lytic or blastic lesions are present. No significant extracranial soft tissue lesion is present. Sinuses/Orbits: The paranasal sinuses and mastoid air cells are clear. Bilateral lens replacements are noted. Globes and orbits are otherwise unremarkable. IMPRESSION: 1. No acute intracranial abnormality or significant interval change. 2. Multiple remote lacunar infarcts in the thalami bilaterally. 3. Stable extensive periventricular white matter disease bilaterally is stable. This likely reflects the sequela of chronic microvascular ischemia. Electronically Signed   By: Audree Leas M.D.   On: 09/21/2023 18:18   DG Abdomen 1 View Result Date: 09/21/2023 CLINICAL DATA:  Constipation EXAM: ABDOMEN - 1 VIEW COMPARISON:  None  Available. FINDINGS: A moderate volume of stool material is present in the right colon. A moderate to large volume of well-formed stool material is favored to be in the left colon and rectum. IMPRESSION: 1. Imaging features suggestive of constipation. Electronically Signed   By: Reagan Camera M.D.   On: 09/21/2023 16:08   CT VIRTUAL COLONOSCOPY DIAGNOSTIC Result Date: 09/08/2023 CLINICAL DATA:  Occult blood positive stool EXAM: CT VIRTUAL COLONOSCOPY DIAGNOSTIC TECHNIQUE: The patient was given a standard bowel preparation with Gastrografin and barium for fluid and stool tagging respectively. The quality of the bowel preparation is fair. Automated CO2 insufflation of the colon was performed prior to  image acquisition and colonic distention is good. Image post processing was used to generate a 3D endoluminal fly-through projection of the colon and to electronically subtract stool/fluid as appropriate. COMPARISON:  11/21/2021 FINDINGS: VIRTUAL COLONOSCOPY Moderate amount of retained barium within the colon. Tortuous transverse colon and sigmoid colon. No fixed polypoid filling defects or annular constricting lesions. Virtual colonoscopy is not designed to detect diminutive polyps (i.e., less than or equal to 5 mm), the presence or absence of which may not affect clinical management. CT ABDOMEN AND PELVIS WITHOUT CONTRAST Lower chest: No acute findings Hepatobiliary: No focal hepatic abnormality. Gallbladder unremarkable. Pancreas: No focal abnormality or ductal dilatation. Spleen: No focal abnormality.  Normal size. Adrenals/Urinary Tract: Stable 6 mm stone in the lower pole of the right kidney. No ureteral stones or hydronephrosis. Adrenal glands and urinary bladder unremarkable. Stomach/Bowel: Stomach and small bowel decompressed unremarkable. Vascular/Lymphatic: Aortic atherosclerosis. No evidence of aneurysm or adenopathy. Reproductive: Uterus and adnexa unremarkable.  No mass. Other: No free fluid or free air.  Musculoskeletal: No acute bony abnormality. IMPRESSION: Tortuous colon. No fixed polypoid filling defects or annular constricting lesions. Aortic atherosclerosis. Right nephrolithiasis.  No hydronephrosis. No acute findings. Electronically Signed   By: Janeece Mechanic M.D.   On: 09/08/2023 00:16      Subjective: Patient seen and examined at the bedside today.  She was working with physical therapy.  Hemodynamically stable.  Overall comfortable.  Alert and oriented.  Denies any new complaints and she feels that she is ready to go home.  I called her brother Royston Cornea twice on phone to update about discharge planning, calls not received, left voicemail  Discharge Exam: Vitals:   09/29/23 0339 09/29/23 0754  BP: (!) 143/80 (!) 144/80  Pulse: 71 73  Resp: 18   Temp: 97.9 F (36.6 C) 98.4 F (36.9 C)  SpO2: 99% 100%   Vitals:   09/28/23 2029 09/29/23 0014 09/29/23 0339 09/29/23 0754  BP: (!) 145/84 135/81 (!) 143/80 (!) 144/80  Pulse: 77 76 71 73  Resp: 16 18 18    Temp: 98.9 F (37.2 C) 98.3 F (36.8 C) 97.9 F (36.6 C) 98.4 F (36.9 C)  TempSrc: Oral Oral Oral Oral  SpO2: 100% 100% 99% 100%  Weight:   63.7 kg   Height:        General: Pt is alert, awake, not in acute distress, residual right-sided weakness Cardiovascular: RRR, S1/S2 +, no rubs, no gallops Respiratory: CTA bilaterally, no wheezing, no rhonchi Abdominal: Soft, NT, ND, bowel sounds + Extremities: no edema, no cyanosis    The results of significant diagnostics from this hospitalization (including imaging, microbiology, ancillary and laboratory) are listed below for reference.     Microbiology: Recent Results (from the past 240 hours)  CSF culture w Gram Stain     Status: None   Collection Time: 09/25/23  6:45 PM   Specimen: CSF; Cerebrospinal Fluid  Result Value Ref Range Status   Specimen Description CSF  Final   Special Requests NONE  Final   Gram Stain NO WBC SEEN NO ORGANISMS SEEN CYTOSPIN SMEAR   Final    Culture   Final    NO GROWTH 3 DAYS Performed at [facility], [address], [place], [place] [address]    Report Status 09/29/2023 FINAL  Final     Labs: BNP (last 3 results) No results for input(s): "BNP" in the last 8760 hours. Basic Metabolic Panel: Recent Labs  Lab 09/24/23 1128 09/25/23 0548 09/26/23  8469 09/27/23 0500 09/28/23 0553 09/29/23 0627  NA  --  139 140 137 136 136  K  --  3.8 3.8 3.8 4.3 4.0  CL  --  105 107 108 107 109  CO2  --  25 21* 22 21* 20*  GLUCOSE  --  99 101* 98 88 95  BUN  --  32* 45* 54* 61* 61*  CREATININE  --  2.89* 3.25* 3.59* 3.38* 3.57*  CALCIUM   --  9.3 9.3 9.0 9.1 9.1  MG 2.1  --   --   --   --   --   PHOS  --   --   --   --  5.0*  --    Liver Function Tests: Recent Labs  Lab 09/28/23 0553  ALBUMIN 3.3*   Recent Labs  Lab 09/24/23 1128  LIPASE 41   No results for input(s): "AMMONIA" in the last 168 hours. CBC: Recent Labs  Lab 09/26/23 0616 09/27/23 0500 09/28/23 0553 09/29/23 0627  WBC 4.8 4.7 4.2 4.6  HGB 8.3* 7.6* 8.0* 7.6*  HCT 25.2* 23.1* 24.0* 23.5*  MCV 90.6 89.5 89.9 90.7  PLT 189 187 175 169   Cardiac Enzymes: No results for input(s): "CKTOTAL", "CKMB", "CKMBINDEX", "TROPONINI" in the last 168 hours. BNP: Invalid input(s): "POCBNP" CBG: Recent Labs  Lab 09/28/23 0622 09/28/23 1204 09/28/23 1618 09/28/23 2124 09/29/23 0627  GLUCAP 94 136* 129* 122* 95   D-Dimer No results for input(s): "DDIMER" in the last 72 hours. Hgb A1c No results for input(s): "HGBA1C" in the last 72 hours. Lipid Profile No results for input(s): "CHOL", "HDL", "LDLCALC", "TRIG", "CHOLHDL", "LDLDIRECT" in the last 72 hours. Thyroid  function studies No results for input(s): "TSH", "T4TOTAL", "T3FREE", "THYROIDAB" in the last 72 hours.  Invalid input(s): "FREET3" Anemia work up No results for input(s): "VITAMINB12", "FOLATE", "FERRITIN", "TIBC", "IRON ", "RETICCTPCT" in the last 72 hours. Urinalysis    Component  Value Date/Time   COLORURINE YELLOW 09/27/2023 1800   APPEARANCEUR CLOUDY (A) 09/27/2023 1800   LABSPEC 1.013 09/27/2023 1800   PHURINE 5.0 09/27/2023 1800   GLUCOSEU NEGATIVE 09/27/2023 1800   HGBUR SMALL (A) 09/27/2023 1800   BILIRUBINUR NEGATIVE 09/27/2023 1800   BILIRUBINUR neg 07/15/2018 1128   KETONESUR NEGATIVE 09/27/2023 1800   PROTEINUR 30 (A) 09/27/2023 1800   UROBILINOGEN 0.2 07/15/2018 1128   UROBILINOGEN 0.2 04/23/2017 0920   NITRITE NEGATIVE 09/27/2023 1800   LEUKOCYTESUR SMALL (A) 09/27/2023 1800   Sepsis Labs Recent Labs  Lab 09/26/23 0616 09/27/23 0500 09/28/23 0553 09/29/23 0627  WBC 4.8 4.7 4.2 4.6   Microbiology Recent Results (from the past 240 hours)  CSF culture w Gram Stain     Status: None   Collection Time: 09/25/23  6:45 PM   Specimen: CSF; Cerebrospinal Fluid  Result Value Ref Range Status   Specimen Description CSF  Final   Special Requests NONE  Final   Gram Stain NO WBC SEEN NO ORGANISMS SEEN CYTOSPIN SMEAR   Final   Culture   Final    NO GROWTH 3 DAYS Performed at [facility], [address], [place], [place] [address]    Report Status 09/29/2023 FINAL  Final    Please note: You were cared for by a hospitalist during your hospital stay. Once you are discharged, your primary care physician will handle any further medical issues. Please note that NO REFILLS for any discharge medications will be authorized once you are discharged, as it is imperative  that you return to your primary care physician (or establish a relationship with a primary care physician if you do not have one) for your post hospital discharge needs so that they can reassess your need for medications and monitor your lab values.    Time coordinating discharge: 40 minutes  SIGNED:   Leona Rake, MD  [facility] 09/29/2023, 10:34 AM Pager [phone]  If 7PM-7AM, please contact night-coverage [email or web address] Password [email or web address]

## 2023-09-29 NOTE — Progress Notes (Signed)
 Patient ID: Christina Orozco, female   DOB: 29-Oct-1970, 53 y.o.   MRN: [id number] S: No complaints O:BP (!) 144/80 (BP Location: Left Arm)   Pulse 73   Temp 98.4 F (36.9 C) (Oral)   Resp 18   Ht 5' 2.5" (1.588 m)   Wt 63.7 kg   LMP 05/22/2018 (Within Days)   SpO2 100%   BMI 25.28 kg/m   Intake/Output Summary (Last 24 hours) at 09/29/2023 0958 Last data filed at 09/29/2023 0340 Gross per 24 hour  Intake --  Output 350 ml  Net -350 ml   Intake/Output: I/O last 3 completed shifts: In: -  Out: 350 [Urine:350]  Intake/Output this shift:  No intake/output data recorded. Weight change:  Gen: NAD CVS: RRR Resp:CTA Abd: +BS, soft, NT/ND Ext: no edema  Recent Labs  Lab 09/23/23 1150 09/24/23 0613 09/25/23 0548 09/26/23 0616 09/27/23 0500 09/28/23 0553 09/29/23 0627  NA 143 143 139 140 137 136 136  K 4.3 4.2 3.8 3.8 3.8 4.3 4.0  CL 112* 105 105 107 108 107 109  CO2 21* 28 25 21* 22 21* 20*  GLUCOSE 101* 103* 99 101* 98 88 95  BUN 30* 30* 32* 45* 54* 61* 61*  CREATININE 2.89* 2.81* 2.89* 3.25* 3.59* 3.38* 3.57*  ALBUMIN  --   --   --   --   --  3.3*  --   CALCIUM  9.6 9.6 9.3 9.3 9.0 9.1 9.1  PHOS  --   --   --   --   --  5.0*  --    Liver Function Tests: Recent Labs  Lab 09/28/23 0553  ALBUMIN 3.3*   Recent Labs  Lab 09/24/23 1128  LIPASE 41   No results for input(s): "AMMONIA" in the last 168 hours. CBC: Recent Labs  Lab 09/26/23 0616 09/27/23 0500 09/28/23 0553 09/29/23 0627  WBC 4.8 4.7 4.2 4.6  HGB 8.3* 7.6* 8.0* 7.6*  HCT 25.2* 23.1* 24.0* 23.5*  MCV 90.6 89.5 89.9 90.7  PLT 189 187 175 169   Cardiac Enzymes: No results for input(s): "CKTOTAL", "CKMB", "CKMBINDEX", "TROPONINI" in the last 168 hours. CBG: Recent Labs  Lab 09/28/23 0622 09/28/23 1204 09/28/23 1618 09/28/23 2124 09/29/23 0627  GLUCAP 94 136* 129* 122* 95    Iron  Studies: No results for input(s): "IRON ", "TIBC", "TRANSFERRIN", "FERRITIN" in the last 72  hours. Studies/Results: US  RENAL Result Date: 09/27/2023 CLINICAL DATA:  [id number].  Acute kidney injury. EXAM: RENAL / URINARY TRACT ULTRASOUND COMPLETE COMPARISON:  CT virtual colonoscopy 09/07/2023 without IV contrast. FINDINGS: Right Kidney: Renal measurements: 7.6 x 5.9 x 5.6 cm = volume: 128.8 mL. Echogenicity within normal limits. No mass or hydronephrosis visualized. There was a 7 mm lower pole caliceal stone within this kidney on the CT but this was not redemonstrated sonographically although is probably still there. Left Kidney: Renal measurements: 9.5 x 5.5 x 5.4 cm = volume: 149.1 mL. Echogenicity within normal limits. No mass or hydronephrosis visualized. Bladder: Appears normal for degree of bladder distention. Other: None. IMPRESSION: 1. No acute sonographic abnormality. No hydronephrosis. No increased cortical echogenicity. 2. 7 mm lower pole caliceal stone in the right kidney on the CT was not redemonstrated sonographically although is probably still present. Electronically Signed   By: Denman Fischer M.D.   On: 09/27/2023 23:20    amLODipine   10 mg Oral Daily   aspirin  EC  81 mg Oral BID   atorvastatin   80 mg Oral QHS   carvedilol   12.5 mg Oral BID WC   FLUoxetine   20 mg Oral Daily   insulin  aspart  0-6 Units Subcutaneous TID WC   montelukast   10 mg Oral QHS   pantoprazole   40 mg Oral BID   senna-docusate  1 tablet Oral BID   ticagrelor   90 mg Oral BID    BMET    Component Value Date/Time   NA 136 09/29/2023 0627   NA 145 (H) 06/17/2020 1053   K 4.0 09/29/2023 0627   CL 109 09/29/2023 0627   CO2 20 (L) 09/29/2023 0627   GLUCOSE 95 09/29/2023 0627   BUN 61 (H) 09/29/2023 0627   BUN 28 (H) 06/17/2020 1053   CREATININE 3.57 (H) 09/29/2023 0627   CREATININE 1.73 (H) 04/23/2017 0943   CALCIUM  9.1 09/29/2023 0627   GFRNONAA 15 (L) 09/29/2023 0627   GFRNONAA 36 (L) 04/23/2017 0943   GFRAA 27 (L) 06/17/2020 1053   GFRAA 41 (L) 04/23/2017 0943   CBC    Component Value  Date/Time   WBC 4.6 09/29/2023 0627   RBC 2.59 (L) 09/29/2023 0627   HGB 7.6 (L) 09/29/2023 0627   HGB 9.8 (L) 08/20/2017 1503   HCT 23.5 (L) 09/29/2023 0627   HCT 29.2 (L) 08/20/2017 1503   PLT 169 09/29/2023 0627   PLT 308 08/20/2017 1503   MCV 90.7 09/29/2023 0627   MCV 84 08/20/2017 1503   MCH 29.3 09/29/2023 0627   MCHC 32.3 09/29/2023 0627   RDW 15.0 09/29/2023 0627   RDW 14.6 08/20/2017 1503   LYMPHSABS 1.7 09/21/2023 1421   LYMPHSABS 2.7 08/20/2017 1503   MONOABS 0.2 09/21/2023 1421   EOSABS 0.1 09/21/2023 1421   EOSABS 0.2 08/20/2017 1503   BASOSABS 0.0 09/21/2023 1421   BASOSABS 0.0 08/20/2017 1503    Assessment/Plan  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  Not far off baseline, but maybe relative hypotension played a part.  No nephrotoxic agents.               - send UA and UP/C             - Renal US  without acute abnormality, no hydronephrosis.              - drink to thirst, avoid NSAIDs             - no need for dialysis at this time             - follows as OP with Dr Nance Aw             - has a history of nephrolithiasis- discussed no teas or sodas   2.  CVA             - L pontine             - per primary/ neuro             - getting PT/OT/SLP             - on ASA/ statin/ brilinta    3.  HTN:             - well controlled   4.  DM II:             Per primary   5.  Dispo: stable for discharge from renal standpoint.  She is to call Dr. Chrystie Crass office for hospital follow up.   Benjamin Brands, MD [facility]

## 2023-10-01 ENCOUNTER — Other Ambulatory Visit (HOSPITAL_COMMUNITY): Payer: Self-pay

## 2023-10-02 ENCOUNTER — Other Ambulatory Visit (HOSPITAL_COMMUNITY): Payer: Self-pay

## 2023-10-11 ENCOUNTER — Telehealth: Payer: Self-pay | Admitting: *Deleted

## 2023-10-11 ENCOUNTER — Telehealth: Payer: Self-pay

## 2023-10-11 DIAGNOSIS — I6302 Cerebral infarction due to thrombosis of basilar artery: Secondary | ICD-10-CM

## 2023-10-11 DIAGNOSIS — I639 Cerebral infarction, unspecified: Secondary | ICD-10-CM

## 2023-10-11 NOTE — Patient Outreach (Addendum)
 Stroke Discharge Follow-up   10/11/2023 Name:  Christina Orozco MRN:  [id number] DOB:  Sep 14, 1970  Subjective: Christina Orozco is a 53 y.o. year old female who is a primary care patient of Hampton Levins, PA-C An Emmi alert was received indicating patient responded to questions: Sad, hopeless, anxious, or empty?. I reached out by phone to follow up on the alert and unable to reach pt  Care Management Interventions:  Follow up plan: Follow up call scheduled for will outreach on 10/12/23  Cecilie Coffee [facility], BSN RN Care Manager/ Transition of Care [place]/ [facility] [phone]

## 2023-10-12 ENCOUNTER — Telehealth: Payer: Self-pay | Admitting: *Deleted

## 2023-10-12 LAB — OLIGOCLONAL BANDS, CSF + SERM

## 2023-10-12 NOTE — Patient Outreach (Signed)
 Stroke Discharge Follow-up   10/12/2023 Name:  Christina Orozco MRN:  [id number] DOB:  01/24/1971  Subjective: Christina Orozco is a 53 y.o. year old female who is a primary care patient of Hampton Levins, PA-C An Emmi alert was received indicating patient responded to questions: Sad, hopeless, anxious, or empty?. I reached out by phone to follow up and unable to reach pt.  Care Management Interventions:  Follow up plan:  will attempt outreach on 10/15/23 Cecilie Coffee [facility], BSN RN Care Manager/ Transition of Care [place]/ [facility] [phone]

## 2023-10-15 ENCOUNTER — Telehealth: Payer: Self-pay | Admitting: *Deleted

## 2023-10-15 NOTE — Patient Outreach (Addendum)
 Stroke Discharge Follow-up   10/15/2023 Name:  Royal Ladino MRN:  [id number] DOB:  1970/08/11  Subjective: Christina Orozco is a 53 y.o. year old female who is a primary care patient of Hampton Levins, PA-C An Emmi alert was received indicating patient responded to questions: Sad, hopeless, anxious, or empty?. I reached out by phone to follow up on the alert and unable to reach pt.  Care Management Interventions:  Follow up plan: No further intervention required.  Cecilie Coffee [facility], BSN RN Care Manager/ Transition of Care [place]/ [facility] [phone]

## 2023-10-15 NOTE — Patient Outreach (Signed)
 Stroke Discharge Follow-up   10/15/2023 Name:  Christina Orozco MRN:  [id number] DOB:  12-Aug-1970  Subjective: Christina Orozco is a 53 y.o. year old female who is a primary care patient of Hampton Levins, PA-C An Emmi alert was received indicating patient responded to questions: Sad, hopeless, anxious, or empty?. I reached out by phone to follow up on the alert and spoke to Patient.  Care Management Interventions:  Patient states she has no depression/ anxiety, states " I'm back on my medication and doing well"  Patient reports she has all medications and taking as prescribed, no concerns voiced and pt did not seem interested in talking.  Follow up plan: No further intervention required.  Cecilie Coffee [facility], BSN RN Care Manager/ Transition of Care [place]/ [facility] [phone]

## 2023-11-23 ENCOUNTER — Emergency Department (HOSPITAL_COMMUNITY)

## 2023-11-23 ENCOUNTER — Encounter (HOSPITAL_COMMUNITY): Payer: Self-pay

## 2023-11-23 ENCOUNTER — Other Ambulatory Visit: Payer: Self-pay

## 2023-11-23 ENCOUNTER — Emergency Department (HOSPITAL_COMMUNITY)
Admission: EM | Admit: 2023-11-23 | Discharge: 2023-11-23 | Disposition: A | Discharge status: Home / Self Care | Attending: Emergency Medicine | Admitting: Emergency Medicine

## 2023-11-23 DIAGNOSIS — R42 Dizziness and giddiness: Secondary | ICD-10-CM | POA: Insufficient documentation

## 2023-11-23 DIAGNOSIS — H538 Other visual disturbances: Secondary | ICD-10-CM | POA: Diagnosis not present

## 2023-11-23 DIAGNOSIS — Z79899 Other long term (current) drug therapy: Secondary | ICD-10-CM | POA: Diagnosis not present

## 2023-11-23 DIAGNOSIS — I10 Essential (primary) hypertension: Secondary | ICD-10-CM

## 2023-11-23 DIAGNOSIS — E1122 Type 2 diabetes mellitus with diabetic chronic kidney disease: Secondary | ICD-10-CM | POA: Insufficient documentation

## 2023-11-23 DIAGNOSIS — Z7951 Long term (current) use of inhaled steroids: Secondary | ICD-10-CM | POA: Insufficient documentation

## 2023-11-23 DIAGNOSIS — I129 Hypertensive chronic kidney disease with stage 1 through stage 4 chronic kidney disease, or unspecified chronic kidney disease: Secondary | ICD-10-CM | POA: Diagnosis not present

## 2023-11-23 DIAGNOSIS — R110 Nausea: Secondary | ICD-10-CM

## 2023-11-23 DIAGNOSIS — N184 Chronic kidney disease, stage 4 (severe): Secondary | ICD-10-CM | POA: Diagnosis not present

## 2023-11-23 DIAGNOSIS — J45909 Unspecified asthma, uncomplicated: Secondary | ICD-10-CM | POA: Diagnosis not present

## 2023-11-23 DIAGNOSIS — Z8673 Personal history of transient ischemic attack (TIA), and cerebral infarction without residual deficits: Secondary | ICD-10-CM | POA: Diagnosis not present

## 2023-11-23 DIAGNOSIS — Z7982 Long term (current) use of aspirin: Secondary | ICD-10-CM | POA: Insufficient documentation

## 2023-11-23 DIAGNOSIS — R251 Tremor, unspecified: Secondary | ICD-10-CM | POA: Diagnosis not present

## 2023-11-23 DIAGNOSIS — H5500 Unspecified nystagmus: Secondary | ICD-10-CM | POA: Insufficient documentation

## 2023-11-23 LAB — COMPREHENSIVE METABOLIC PANEL WITH GFR
ALT: 28 U/L (ref 0–44)
AST: 39 U/L (ref 15–41)
Albumin: 4 g/dL (ref 3.5–5.0)
Alkaline Phosphatase: 48 U/L (ref 38–126)
Anion gap: 13 (ref 5–15)
BUN: 37 mg/dL — ABNORMAL HIGH (ref 6–20)
CO2: 20 mmol/L — ABNORMAL LOW (ref 22–32)
Calcium: 9.4 mg/dL (ref 8.9–10.3)
Chloride: 111 mmol/L (ref 98–111)
Creatinine, Ser: 2.89 mg/dL — ABNORMAL HIGH (ref 0.44–1.00)
GFR, Estimated: 19 mL/min — ABNORMAL LOW (ref >60)
Glucose, Bld: 101 mg/dL — ABNORMAL HIGH (ref 70–99)
Potassium: 4.3 mmol/L (ref 3.5–5.1)
Sodium: 144 mmol/L (ref 135–145)
Total Bilirubin: 0.8 mg/dL (ref 0.0–1.2)
Total Protein: 7.3 g/dL (ref 6.5–8.1)

## 2023-11-23 LAB — CBC
HCT: 29.6 % — ABNORMAL LOW (ref 36.0–46.0)
Hemoglobin: 9.6 g/dL — ABNORMAL LOW (ref 12.0–15.0)
MCH: 29.4 pg (ref 26.0–34.0)
MCHC: 32.4 g/dL (ref 30.0–36.0)
MCV: 90.5 fL (ref 80.0–100.0)
Platelets: 202 10*3/uL (ref 150–400)
RBC: 3.27 MIL/uL — ABNORMAL LOW (ref 3.87–5.11)
RDW: 14.8 % (ref 11.5–15.5)
WBC: 4.8 10*3/uL (ref 4.0–10.5)
nRBC: 0 % (ref 0.0–0.2)

## 2023-11-23 MED ORDER — AMLODIPINE BESYLATE 5 MG PO TABS
10.0000 mg | ORAL_TABLET | Freq: Every day | ORAL | Status: DC
  Administered 2023-11-23: 10 mg via ORAL
  Filled 2023-11-23: qty 2

## 2023-11-23 MED ORDER — TICAGRELOR 90 MG PO TABS
90.0000 mg | ORAL_TABLET | Freq: Every day | ORAL | Status: DC
  Administered 2023-11-23: 90 mg via ORAL
  Filled 2023-11-23: qty 1

## 2023-11-23 MED ORDER — ONDANSETRON 4 MG PO TBDP: 4.0000 mg | ORAL_TABLET | Freq: Three times a day (TID) | ORAL | 0 refills | Status: DC | PRN

## 2023-11-23 MED ORDER — MECLIZINE HCL 25 MG PO TABS
50.0000 mg | ORAL_TABLET | Freq: Once | ORAL | Status: AC
  Administered 2023-11-23: 50 mg via ORAL
  Filled 2023-11-23: qty 2

## 2023-11-23 MED ORDER — DIAZEPAM 5 MG/ML IJ SOLN
2.5000 mg | Freq: Once | INTRAMUSCULAR | Status: AC
  Administered 2023-11-23: 2.5 mg via INTRAVENOUS
  Filled 2023-11-23: qty 2

## 2023-11-23 MED ORDER — ASPIRIN 81 MG PO CHEW
81.0000 mg | CHEWABLE_TABLET | Freq: Once | ORAL | Status: AC
  Administered 2023-11-23: 81 mg via ORAL
  Filled 2023-11-23: qty 1

## 2023-11-23 MED ORDER — ONDANSETRON HCL 4 MG/2ML IJ SOLN
4.0000 mg | Freq: Once | INTRAMUSCULAR | Status: AC
  Administered 2023-11-23: 4 mg via INTRAVENOUS
  Filled 2023-11-23: qty 2

## 2023-11-23 MED ORDER — ATORVASTATIN CALCIUM 40 MG PO TABS
80.0000 mg | ORAL_TABLET | Freq: Every day | ORAL | Status: DC
  Administered 2023-11-23: 80 mg via ORAL
  Filled 2023-11-23: qty 2

## 2023-11-23 MED ORDER — MECLIZINE HCL 25 MG PO TABS: 25.0000 mg | ORAL_TABLET | Freq: Three times a day (TID) | ORAL | 0 refills | Status: DC | PRN

## 2023-11-23 MED ORDER — DIAZEPAM 2 MG PO TABS: 2.0000 mg | ORAL_TABLET | Freq: Two times a day (BID) | ORAL | 0 refills | Status: DC | PRN

## 2023-11-23 MED ORDER — CARVEDILOL 12.5 MG PO TABS
12.5000 mg | ORAL_TABLET | Freq: Two times a day (BID) | ORAL | Status: DC
  Administered 2023-11-23: 12.5 mg via ORAL
  Filled 2023-11-23: qty 1

## 2023-11-23 NOTE — ED Provider Notes (Signed)
 53 yo female w/ hx of prior CVA presenting with dizziness onset this morning Has baseline ataxia, possible new nystagmus on exam HTN on arrival Basic labs okay - pending MRI for stroke eval  Physical Exam  BP (!) 190/95   Pulse 71   Temp 98.4 F (36.9 C) (Oral)   Resp (!) 21   Ht 5' 3 (1.6 m)   Wt 63.7 kg   LMP 05/22/2018 (Within Days)   SpO2 100%   BMI 24.88 kg/m   Physical Exam  Procedures  Procedures  ED Course / MDM    Medical Decision Making Amount and/or Complexity of Data Reviewed Labs: ordered. Radiology: ordered.  Risk OTC drugs. Prescription drug management.   No acute stroke on MRI.  Prior strokes noted.  Patient was reassessed and reports that her vertigo and nausea are better now.  We will attempt to ambulate again as she typically does get around with a rollator.  Her sister-in-law is present at the bedside reports the patient could stay with her tonight as needed for dizziness.  The patient reports she did not take any of her medications this morning, therefore I have ordered her main medications based on her discharge summary recently.  This includes her blood pressure medicine.  I suspect may be some component of recrudescence given the reported slurred speech, in the setting of peripheral vertigo.  We can prescribe meclizine  and small dose Valium  as needed for the next few days.  But there would be no indication for hospitalization now unless she is truly unsteady or unable to walk  Patient was able to ambulate near her baseline with a walker.  She will go home with a family member tonight.  Can follow-up with PCP for recheck of her symptoms and blood pressure     Arvilla Birmingham, MD 11/23/23 2143

## 2023-11-23 NOTE — ED Provider Notes (Signed)
 [place] EMERGENCY DEPARTMENT AT [facility] Provider Note   CSN: [id number] Arrival date & time: 11/23/23  1120     Patient presents with: Hypertension, Dizziness, and Numbness   Christina Orozco is a 53 y.o. female.    Hypertension  Dizziness Patient dents with dizziness.  Went to bed around 930 last night.  Woke up this morning at around 5 in the morning vomiting.  Feels more dizzy.  Does have some dizziness at baseline after previous stroke.  Is chronically weak on the right side.  Reportedly multiple double small vessel strokes.  States feeling worse.  Somewhat decreased oral intake.  Dizziness when she moves her head around.  States difficulty standing.    Past Medical History:  Diagnosis Date   Anemia    severe   Anxiety    Asthma    CKD (chronic kidney disease)    stage IV   DDD (degenerative disc disease), cervical    Depression    Diabetes mellitus (HCC)    Dislocated shoulder    right   GERD (gastroesophageal reflux disease)    Headache    migraines (about once a month)   History of kidney stones    Hypertension    Neuropathy    Peripheral vascular disease (HCC)    Pneumonia    Stroke [facility])     Prior to Admission medications   Medication Sig Start Date End Date Taking? Authorizing Provider  albuterol  (PROAIR  HFA) 108 (90 Base) MCG/ACT inhaler INHALE 1 OR 2 PUFFS INTO THE LUNGS EVERY 6 HOURS AS NEEDED FOR WHEEZING OR SHORTNESS OF BREATH 04/12/18   Angiulli, Everlyn Hockey, PA-C  amLODipine  (NORVASC ) 10 MG tablet TAKE 1 TABLET (10 MG TOTAL) BY MOUTH DAILY. (AM) 03/05/23   Clearnce Curia, NP  aspirin  EC 81 MG tablet Take 1 tablet (81 mg total) by mouth 2 (two) times daily. Swallow whole. 09/29/23   Leona Rake, MD  atorvastatin  (LIPITOR ) 80 MG tablet Take 1 tablet (80 mg total) by mouth at bedtime. 10/05/22   Walker, Caitlin S, NP  carvedilol  (COREG ) 12.5 MG tablet Take 1 tablet (12.5 mg total) by mouth 2 (two) times daily with a meal. 08/28/23    Walker, Caitlin S, NP  FLUoxetine  (PROZAC ) 20 MG capsule Take 20 mg by mouth daily.    [provider]  fluticasone  (FLONASE ) 50 MCG/ACT nasal spray Place 2 sprays into both nostrils daily as needed for allergies or rhinitis. 04/27/23   Love, Renay Carota, PA-C  melatonin 5 MG TABS Take 1 tablet (5 mg total) by mouth at bedtime as needed. 04/27/23   Love, Renay Carota, PA-C  montelukast  (SINGULAIR ) 10 MG tablet Take 10 mg by mouth at bedtime.    [provider]  naphazoline-glycerin  (CLEAR EYES REDNESS) 0.012-0.25 % SOLN Place 1-2 drops into the left eye 2 (two) times daily. Patient not taking: Reported on 09/21/2023 04/24/23   Love, Renay Carota, PA-C  OZEMPIC , 0.25 OR 0.5 MG/DOSE, 2 MG/3ML SOPN Inject 0.5 mg into the skin See admin instructions. Inject 0.5 mg subcutaneous every 10 days 04/14/22   [provider]  pantoprazole  (PROTONIX ) 40 MG tablet Take 1 tablet (40 mg total) by mouth 2 (two) times daily. 04/27/23   Love, Renay Carota, PA-C  senna-docusate (SENOKOT-S) 8.6-50 MG tablet Take 1 tablet by mouth 2 (two) times daily. 09/29/23   Leona Rake, MD  sodium bicarbonate  650 MG tablet Take 1 tablet (650 mg total) by mouth 3 (three) times daily.  09/29/23   Leona Rake, MD  ticagrelor  (BRILINTA ) 90 MG TABS tablet Take 1 tablet (90 mg total) by mouth 2 (two) times daily. 09/29/23   Leona Rake, MD    Allergies: Lisinopril , Penicillins, Quetiapine, and Gabapentin     Review of Systems  Neurological:  Positive for dizziness.    Updated Vital Signs BP (!) 188/98   Pulse 84   Temp 98.4 F (36.9 C) (Oral)   Resp 19   Ht 5' 3 (1.6 m)   Wt 63.7 kg   LMP 05/22/2018 (Within Days)   SpO2 100%   BMI 24.88 kg/m   Physical Exam Vitals and nursing note reviewed.   Eyes:     Comments: Conjugate gaze, however with gaze to the left does have nystagmus horizontally in the left eye.   Cardiovascular:     Rate and Rhythm: Normal rate.  Pulmonary:     Breath sounds: No  wheezing or rhonchi.  Abdominal:     Tenderness: There is no abdominal tenderness.   Neurological:     Mental Status: She is alert.     Comments: Visual fields grossly intact.  Good grip strength bilaterally.  Chronically weak on the right.  States paresthesias over 2nd, 3rd and 4th finger on the right hand.  Does have nystagmus in the left eye with gaze to the left.    (all labs ordered are listed, but only abnormal results are displayed) Labs Reviewed  CBC - Abnormal; Notable for the following components:      Result Value   RBC 3.27 (*)    Hemoglobin 9.6 (*)    HCT 29.6 (*)    All other components within normal limits  COMPREHENSIVE METABOLIC PANEL WITH GFR - Abnormal; Notable for the following components:   CO2 20 (*)    Glucose, Bld 101 (*)    BUN 37 (*)    Creatinine, Ser 2.89 (*)    GFR, Estimated 19 (*)    All other components within normal limits    EKG: EKG Interpretation Date/Time:  Friday November 23 2023 11:25:41 EDT Ventricular Rate:  71 PR Interval:  146 QRS Duration:  94 QT Interval:  413 QTC Calculation: 449 R Axis:   60  Text Interpretation: Sinus rhythm LAE, consider biatrial enlargement Low voltage, precordial leads Confirmed by Mozell Arias [phone]) on 11/23/2023 3:27:01 PM  Radiology: CT HEAD WO CONTRAST ( ) Result Date: 11/23/2023 CLINICAL DATA:  Neuro deficit, concern for stroke. Hypertension, dizziness, and numbness. EXAM: CT HEAD WITHOUT CONTRAST TECHNIQUE: Contiguous axial images were obtained from the base of the skull through the vertex without intravenous contrast. RADIATION DOSE REDUCTION: This exam was performed according to the departmental dose-optimization program which includes automated exposure control, adjustment of the mA and/or kV according to patient size and/or use of iterative reconstruction technique. COMPARISON:  MRI/MRA head 09/22/2023. FINDINGS: Brain: No acute intracranial hemorrhage. Remote lacunar infarcts in the left basal  ganglia, bilateral thalami, and pons. Nonspecific hypoattenuation in the periventricular and subcortical white matter favored to reflect chronic microvascular ischemic changes. No edema, mass effect, or midline shift. The basilar cisterns are patent. Ventricles: The ventricles are normal. Vascular: Atherosclerotic calcifications of the carotid siphons. No hyperdense vessel. Skull: No acute or aggressive finding. Orbits: Bilateral lens replacement. Sinuses: Mucosal thickening and layering fluid in the sphenoid sinuses. Other: Mastoid air cells are clear. IMPRESSION: No CT evidence of acute intracranial abnormality. Extensive chronic microvascular ischemic changes. Remote lacunar infarcts in the bilateral thalami, left basal ganglia, and  pons. Mucosal thickening and layering fluid in the sphenoid sinuses. Recommend clinical correlation for acute sinusitis. Electronically Signed   By: Denny Flack M.D.   On: 11/23/2023 13:05     Procedures   Medications Ordered in the ED  ondansetron  (ZOFRAN ) injection 4 mg (4 mg Intravenous Given 11/23/23 1348)                                    Medical Decision Making Amount and/or Complexity of Data Reviewed Labs: ordered. Radiology: ordered.  Risk Prescription drug management.   Patient with potentially new or increasing deficits in the face of chronic deficits with previous strokes.  Not a TNK candidate due to last normal of 930 last night and not LVO positive.  However does have some nystagmus.  Will start with head CT but will require MRI if no bleeding.  CT scans reassuring.  Now states she is having some blurred vision in the right eye.  States it is shaking.  Still has the nystagmus and at times slightly disconjugate gaze from the left eye.  MRI still pending.  Care turned over to Dr. Gordon Latus       Final diagnoses:  None    ED Discharge Orders     None          Mozell Arias, MD 11/23/23 1558

## 2023-11-23 NOTE — ED Triage Notes (Signed)
 BIB [facility] stroke every other month since last year. HX of small vessel disease. Right sided deficits. Slurred speech at baseline. Unable to walk today which us  abnormal. Stroke screen negative. Vomited this morning at 0500 LKW. No Meds today . BP 170/90 CBG 120 80HR 100

## 2023-11-23 NOTE — ED Notes (Signed)
 Pt to MRI

## 2023-11-23 NOTE — ED Notes (Signed)
 Patient transported to CT

## 2023-11-23 NOTE — Discharge Instructions (Addendum)
 Please schedule follow-up appointment with your primary care doctor to recheck your symptoms as well as your blood pressure, which was high in the ER today.

## 2023-12-07 ENCOUNTER — Encounter: Payer: 59 | Attending: Physical Medicine & Rehabilitation | Admitting: Physical Medicine & Rehabilitation

## 2023-12-07 ENCOUNTER — Encounter: Payer: Self-pay | Admitting: Physical Medicine & Rehabilitation

## 2023-12-07 VITALS — BP 163/90 | HR 74 | Ht 63.0 in

## 2023-12-07 DIAGNOSIS — G8191 Hemiplegia, unspecified affecting right dominant side: Secondary | ICD-10-CM | POA: Insufficient documentation

## 2023-12-07 DIAGNOSIS — R531 Weakness: Secondary | ICD-10-CM | POA: Diagnosis present

## 2023-12-07 NOTE — Progress Notes (Signed)
Subjective

## 2023-12-07 NOTE — Progress Notes (Signed)
 Subjective:    Patient ID: Christina Orozco, female    DOB: [birth year], 53 y.o.   MRN: [id number] 53 y.o. RH-female with history HTN, T2DM with retinopathy, neuropathy and nephropathy, MDD, OSA, multiple strokes last 08/30/2022 with right-sided weakness and dysarthria with ongoing outpatient therapy.  She was admitted on 04/13/2023 after syncopal episode with intractable nausea vomiting and double vision left eye.  MRI brain done revealing acute 7 mm cortical infarct in right frontoparietal region and right splenium with severe chronic small vessel disease as well as multiple chronic hemorrhages suggestive of chronic poorly controlled HTN.  Case was discussed with neurology who recommended continuing aspirin , Brilinta  and atorvastatin  as she was on maximum therapy.  Her GI symptoms had resolved but she continued to be limited by decrease in motor control of RUE as well as distorted vision on the left.  Therapy was working with patient required contact-guard to min assist overall.  CIR was recommended due to functional decline.     Admit date: 04/17/2023 Discharge date: 04/28/2023 HPI Another admission for CVA 4/11-19/2025 .MRI brain without contrast showed 4 mm acute ischemic left pontine infarct. No associated mass effect or hemorrhage. Underlying advanced chronic microvascular schema disease with multiple remote lacunar infarct involving bilateral basal ganglia, thalami, and pons. Multiple chronic microhemorrhages most pronounced about the pons and thalami, most characteristic of poorly controlled hypertension .  Neurology was consulted.  Stroke workup initiated and completed.  Hospital course also remarkable for AKI on CKD, now improving.   HH PT  No falls, needs helps with dressing and bathing through a [facility] Aide  Planning to have PT twice a week Finished OT,  Dressed herself today but usually needs assist for bathing  Staying with her brother and sister in law after stroke readmit Uses DSS  transportation  Right arm and leg weaker since stroke in April 2025, also feels L side face is weaker  Pain Inventory Average Pain 5 Pain Right Now 4 My pain is intermittent, constant, sharp, burning, dull, stabbing, tingling, and aching  LOCATION OF PAIN  left side of neck, buttock  BOWEL Number of stools per week: 3 Oral laxative use Yes  Type of laxative Senokot Enema or suppository use No  History of colostomy No  Incontinent No   BLADDER Pads  Bladder incontinence Yes     Mobility walk with assistance use a walker ability to climb steps?  no do you drive?  no Do you have any goals in this area?  yes  Function disabled: date disabled 2020 I need assistance with the following:  feeding, dressing, bathing, toileting, meal prep, household duties, and shopping Do you have any goals in this area?  yes  Neuro/Psych bladder control problems weakness numbness tremor tingling trouble walking dizziness confusion depression anxiety  Prior Studies Any changes since last visit?  no  Physicians involved in your care Any changes since last visit?  no   Family History  Problem Relation Age of Onset   Vascular Disease Mother    CAD Mother    Heart failure Mother    Heart disease Other    Cancer Other        colon, stomach, pancreatic, lung   Diabetes Other    Breast cancer Sister 30   Seizures Maternal Uncle    Social History   Socioeconomic History   Marital status: Single    Spouse name: Not on file   Number of children: 3   Years of education: 12+  Highest education level: Some college, no degree  Occupational History   Not on file  Tobacco Use   Smoking status: Never   Smokeless tobacco: Never  Vaping Use   Vaping status: Never Used  Substance and Sexual Activity   Alcohol  use: No   Drug use: No   Sexual activity: Not Currently  Other Topics Concern   Not on file  Social History Narrative   Lives with daughter   Caffeine use: once in  a blue moon   Right handed   Social Drivers of Health   Financial Resource Strain: Medium Risk (09/06/2022)   Received from [facility]   Overall Financial Resource Strain (CARDIA)    Difficulty of Paying Living Expenses: Somewhat hard  Food Insecurity: No Food Insecurity (10/15/2023)   Hunger Vital Sign    Worried About Running Out of Food in the Last Year: Never true    Ran Out of Food in the Last Year: Never true  Transportation Needs: No Transportation Needs (10/15/2023)   PRAPARE - Administrator, Civil Service (Medical): No    Lack of Transportation (Non-Medical): No  Physical Activity: Not on file  Stress: Not on file  Social Connections: Unknown (10/10/2021)   Received from [facility]   Social Network    Social Network: Not on file   Past Surgical History:  Procedure Laterality Date   ADENOIDECTOMY     APPENDECTOMY     CERVICAL SPINE SURGERY  06/2012   C5-C6   EYE SURGERY     left eye surgery    FOOT SURGERY     KENALOG  INJECTION Left 03/10/2021   Procedure: Intravitreal Avastin  Injection;  Surgeon: Jarold Mayo, MD;  Location: [facility] OR;  Service: Ophthalmology;  Laterality: Left;   LOOP RECORDER INSERTION N/A 08/27/2017   Procedure: LOOP RECORDER INSERTION;  Surgeon: Francyne Headland, MD;  Location: [facility] INVASIVE CV LAB;  Service: Cardiovascular;  Laterality: N/A;   MEMBRANE PEEL Left 03/10/2021   Procedure: MEMBRANE PEEL;  Surgeon: Jarold Mayo, MD;  Location: [facility] OR;  Service: Ophthalmology;  Laterality: Left;   PARS PLANA VITRECTOMY Left 03/10/2021   Procedure: TWENTY-FIVE GAUGE PARS PLANA VITRECTOMY;  Surgeon: Jarold Mayo, MD;  Location: [facility] OR;  Service: Ophthalmology;  Laterality: Left;   PHOTOCOAGULATION WITH LASER Left 03/10/2021   Procedure: PHOTOCOAGULATION WITH LASER;  Surgeon: Jarold Mayo, MD;  Location: [facility] OR;  Service: Ophthalmology;  Laterality: Left;   SHOULDER SURGERY     TEE WITHOUT CARDIOVERSION N/A 08/27/2017   Procedure: TRANSESOPHAGEAL  ECHOCARDIOGRAM (TEE);  Surgeon: Francyne Headland, MD;  Location: [facility] ENDOSCOPY;  Service: Cardiovascular;  Laterality: N/A;   TONSILLECTOMY     Past Medical History:  Diagnosis Date   Anemia    severe   Anxiety    Asthma    CKD (chronic kidney disease)    stage IV   DDD (degenerative disc disease), cervical    Depression    Diabetes mellitus (HCC)    Dislocated shoulder    right   GERD (gastroesophageal reflux disease)    Headache    migraines (about once a month)   History of kidney stones    Hypertension    Neuropathy    Peripheral vascular disease (HCC)    Pneumonia    Stroke (HCC)    BP (!) 163/90   Pulse 74   Ht 5' 3 (1.6 m)   LMP 05/22/2018 (Within Days)   SpO2 100%   BMI 24.88 kg/m   Opioid  Risk Score:   Fall Risk Score:  `1  Depression screen [facility] 2/9     12/07/2023   11:04 AM 10/15/2023    9:46 AM 05/08/2023    9:26 AM 10/10/2022   10:28 AM 04/27/2022    3:13 PM 03/02/2022   10:32 AM 01/17/2022   11:17 AM  Depression screen PHQ 2/9  Decreased Interest 1 0 0 0 1  1  Down, Depressed, Hopeless 1 0 0 0 1  2  PHQ - 2 Score 2 0 0 0 2  3  Altered sleeping   0   1 1  Tired, decreased energy   1   2 2   Change in appetite   1    2  Feeling bad or failure about yourself    1    3  Trouble concentrating   1    2  Moving slowly or fidgety/restless   0    1  Suicidal thoughts   0    0  PHQ-9 Score   4    14  Difficult doing work/chores   Somewhat difficult    Somewhat difficult    Review of Systems  Constitutional:        Loss of taste  Genitourinary:        Incontinence   Musculoskeletal:  Positive for gait problem.  Neurological:  Positive for dizziness, tremors, facial asymmetry, weakness and numbness.       Tingling  Psychiatric/Behavioral:         Depression, Anxiety  All other systems reviewed and are negative.      Objective:   Physical Exam 4/5 strength in the right deltoid by stress of grip as well as hip flexor knee extensor ankle  dorsiflexor. Positive dysdiadochokinesis with rapid alternating supination pronation of the right upper extremity Finger thumb opposition is moderately slowed on the right side and mildly slowed on the left side. There is dysmetria on finger-nose-finger testing mild to moderate on the right side and mild on the left side Extraocular muscles appear intact although she does have some diplopia with lateral gaze bilaterally. Smile is symmetric Mild upper facial weakness on the left side Sensation intact bilateral upper limbs      Assessment & Plan:   1.  Recent left pontine infarct supra superimposed on previous right frontal cortical infarcts as well as bilateral subcortical and thalamic infarcts.  Small vessel disease as primary etiology and has been followed by neurology for this.  Her functional decline from this most recent stroke is fortunately mild but she does require some additional physical therapy.  We discussed that she may be a good candidate for outpatient rehab as long as she has transportation.  She will continue her home health therapy for now and notify our office if she needs an order for outpatient. Return to clinic 3 months

## 2023-12-15 ENCOUNTER — Other Ambulatory Visit: Payer: Self-pay

## 2023-12-15 ENCOUNTER — Emergency Department (HOSPITAL_BASED_OUTPATIENT_CLINIC_OR_DEPARTMENT_OTHER)
Admission: EM | Admit: 2023-12-15 | Discharge: 2023-12-15 | Disposition: A | Discharge status: Home / Self Care | Attending: Emergency Medicine | Admitting: Emergency Medicine

## 2023-12-15 ENCOUNTER — Encounter (HOSPITAL_BASED_OUTPATIENT_CLINIC_OR_DEPARTMENT_OTHER): Payer: Self-pay | Admitting: Emergency Medicine

## 2023-12-15 DIAGNOSIS — Z7982 Long term (current) use of aspirin: Secondary | ICD-10-CM | POA: Diagnosis not present

## 2023-12-15 DIAGNOSIS — J45909 Unspecified asthma, uncomplicated: Secondary | ICD-10-CM | POA: Diagnosis not present

## 2023-12-15 DIAGNOSIS — N184 Chronic kidney disease, stage 4 (severe): Secondary | ICD-10-CM | POA: Insufficient documentation

## 2023-12-15 DIAGNOSIS — Z79899 Other long term (current) drug therapy: Secondary | ICD-10-CM | POA: Insufficient documentation

## 2023-12-15 DIAGNOSIS — R202 Paresthesia of skin: Secondary | ICD-10-CM | POA: Diagnosis not present

## 2023-12-15 DIAGNOSIS — Z794 Long term (current) use of insulin: Secondary | ICD-10-CM | POA: Diagnosis not present

## 2023-12-15 DIAGNOSIS — E1142 Type 2 diabetes mellitus with diabetic polyneuropathy: Secondary | ICD-10-CM | POA: Diagnosis not present

## 2023-12-15 DIAGNOSIS — I129 Hypertensive chronic kidney disease with stage 1 through stage 4 chronic kidney disease, or unspecified chronic kidney disease: Secondary | ICD-10-CM | POA: Insufficient documentation

## 2023-12-15 DIAGNOSIS — M79601 Pain in right arm: Secondary | ICD-10-CM | POA: Diagnosis present

## 2023-12-15 LAB — BASIC METABOLIC PANEL WITH GFR
Anion gap: 13 (ref 5–15)
BUN: 48 mg/dL — ABNORMAL HIGH (ref 6–20)
CO2: 19 mmol/L — ABNORMAL LOW (ref 22–32)
Calcium: 9.6 mg/dL (ref 8.9–10.3)
Chloride: 111 mmol/L (ref 98–111)
Creatinine, Ser: 3.83 mg/dL — ABNORMAL HIGH (ref 0.44–1.00)
GFR, Estimated: 14 mL/min — ABNORMAL LOW (ref >60)
Glucose, Bld: 108 mg/dL — ABNORMAL HIGH (ref 70–99)
Potassium: 4.7 mmol/L (ref 3.5–5.1)
Sodium: 142 mmol/L (ref 135–145)

## 2023-12-15 MED ORDER — METHOCARBAMOL 500 MG PO TABS
500.0000 mg | ORAL_TABLET | Freq: Once | ORAL | Status: AC
  Administered 2023-12-15: 500 mg via ORAL
  Filled 2023-12-15: qty 1

## 2023-12-15 MED ORDER — METHOCARBAMOL 500 MG PO TABS: 500.0000 mg | ORAL_TABLET | Freq: Three times a day (TID) | ORAL | 0 refills | Status: DC | PRN

## 2023-12-15 MED ORDER — KETOROLAC TROMETHAMINE 15 MG/ML IJ SOLN
15.0000 mg | Freq: Once | INTRAMUSCULAR | Status: AC
  Administered 2023-12-15: 15 mg via INTRAVENOUS
  Filled 2023-12-15: qty 1

## 2023-12-15 NOTE — ED Provider Notes (Signed)
[place] EMERGENCY DEPARTMENT AT [facility] Provider Note   CSN: [id number] Arrival date & time: 12/15/23  1330     Patient presents with: No chief complaint on file.   Christina Orozco is a 53 y.o. female.   HPI Patient presents with right arm pain.  Began yesterday.  Worse with certain movements.  States hand was tingly but now more congested upper arm.  Previous stroke on that side.  No fevers.  No trauma.  No swelling.   Past Medical History:  Diagnosis Date   Anemia    severe   Anxiety    Asthma    CKD (chronic kidney disease)    stage IV   DDD (degenerative disc disease), cervical    Depression    Diabetes mellitus (HCC)    Dislocated shoulder    right   GERD (gastroesophageal reflux disease)    Headache    migraines (about once a month)   History of kidney stones    Hypertension    Neuropathy    Peripheral vascular disease (HCC)    Pneumonia    Stroke [facility])     Prior to Admission medications   Medication Sig Start Date End Date Taking? Authorizing Provider  methocarbamol  (ROBAXIN ) 500 MG tablet Take 1 tablet (500 mg total) by mouth every 8 (eight) hours as needed. 12/15/23  Yes Patsey Lot, MD  albuterol  (PROAIR  HFA) 108 (90 Base) MCG/ACT inhaler INHALE 1 OR 2 PUFFS INTO THE LUNGS EVERY 6 HOURS AS NEEDED FOR WHEEZING OR SHORTNESS OF BREATH 04/12/18   Angiulli, Toribio PARAS, PA-C  amLODipine  (NORVASC ) 10 MG tablet TAKE 1 TABLET (10 MG TOTAL) BY MOUTH DAILY. (AM) Patient taking differently: Take 10 mg by mouth in the morning. 03/05/23   Vannie Reche RAMAN, NP  aspirin  EC 81 MG tablet Take 1 tablet (81 mg total) by mouth 2 (two) times daily. Swallow whole. 09/29/23   Jillian Buttery, MD  atorvastatin  (LIPITOR ) 80 MG tablet Take 1 tablet (80 mg total) by mouth at bedtime. 10/05/22   Walker, Caitlin S, NP  carvedilol  (COREG ) 12.5 MG tablet Take 1 tablet (12.5 mg total) by mouth 2 (two) times daily with a meal. 08/28/23   Walker, Caitlin S, NP  diazepam   (VALIUM ) 2 MG tablet Take 1 tablet (2 mg total) by mouth every 12 (twelve) hours as needed for up to 12 doses (vertigo). 11/23/23   Cottie Donnice PARAS, MD  FLUoxetine  (PROZAC ) 20 MG capsule Take 20 mg by mouth daily.    [provider]  fluticasone  (FLONASE ) 50 MCG/ACT nasal spray Place 2 sprays into both nostrils daily as needed for allergies or rhinitis. 04/27/23   Love, Sharlet RAMAN, PA-C  meclizine  (ANTIVERT ) 25 MG tablet Take 1 tablet (25 mg total) by mouth 3 (three) times daily as needed for up to 30 doses for dizziness. 11/23/23   Cottie Donnice PARAS, MD  melatonin 5 MG TABS Take 1 tablet (5 mg total) by mouth at bedtime as needed. 04/27/23   Love, Sharlet RAMAN, PA-C  montelukast  (SINGULAIR ) 10 MG tablet Take 10 mg by mouth at bedtime.    [provider]  naphazoline-glycerin  (CLEAR EYES REDNESS) 0.012-0.25 % SOLN Place 1-2 drops into the left eye 2 (two) times daily. 04/24/23   Love, Sharlet RAMAN, PA-C  ondansetron  (ZOFRAN -ODT) 4 MG disintegrating tablet Take 1 tablet (4 mg total) by mouth every 8 (eight) hours as needed for up to 15 doses for nausea or vomiting. 11/23/23   Cottie Donnice  J, MD  OZEMPIC , 0.25 OR 0.5 MG/DOSE, 2 MG/3ML SOPN Inject 0.5 mg into the skin See admin instructions. Inject 0.5 mg subcutaneous every 10 days 04/14/22   [provider]  pantoprazole  (PROTONIX ) 40 MG tablet Take 1 tablet (40 mg total) by mouth 2 (two) times daily. 04/27/23   Love, Sharlet RAMAN, PA-C  senna-docusate (SENOKOT-S) 8.6-50 MG tablet Take 1 tablet by mouth 2 (two) times daily. 09/29/23   Jillian Buttery, MD  sodium bicarbonate  650 MG tablet Take 1 tablet (650 mg total) by mouth 3 (three) times daily. 09/29/23   Jillian Buttery, MD  ticagrelor  (BRILINTA ) 90 MG TABS tablet Take 1 tablet (90 mg total) by mouth 2 (two) times daily. 09/29/23   Jillian Buttery, MD    Allergies: Lisinopril , Penicillins, Quetiapine, and Gabapentin     Review of Systems  Updated Vital Signs BP 134/76   Pulse 67    Temp 98.5 F (36.9 C) (Oral)   Resp 20   Wt 65.3 kg   LMP 05/22/2018 (Within Days)   SpO2 100%   BMI 25.51 kg/m   Physical Exam Vitals and nursing note reviewed.  Cardiovascular:     Rate and Rhythm: Regular rhythm.  Musculoskeletal:        General: Tenderness present.     Comments: Strong radial pulse on right.  Some chronic weakness on right side.  Some tenderness over right upper arm without swelling.  Neurological:     Mental Status: She is alert.   Good range of motion right shoulder and elbow.  (all labs ordered are listed, but only abnormal results are displayed) Labs Reviewed  BASIC METABOLIC PANEL WITH GFR - Abnormal; Notable for the following components:      Result Value   CO2 19 (*)    Glucose, Bld 108 (*)    BUN 48 (*)    Creatinine, Ser 3.83 (*)    GFR, Estimated 14 (*)    All other components within normal limits    EKG: None  Radiology: No results found.   Procedures   Medications Ordered in the ED  methocarbamol  (ROBAXIN ) tablet 500 mg (has no administration in time range)  ketorolac  (TORADOL ) 15 MG/ML injection 15 mg (15 mg Intravenous Given 12/15/23 1609)                                    Medical Decision Making Amount and/or Complexity of Data Reviewed Labs: ordered.  Risk Prescription drug management.  Patient pain in right upper arm.  Chronic neurodeficits.  No swelling.  DVT felt less likely.  Arterial occlusion felt less likely.  Has chronic kidney disease it is rather severe.  Will look for electrolyte abnormalities and give symptomatic treatment.  Basic metabolic is abnormal but appears stable for her.  Doubt severe cause of the pain.  Appears stable for discharge home.  Only mild improvement of Toradol .  Will give Robaxin .  Discussed with pharmacist and it is safe with her renal insufficiency.  Follow-up with PCP as needed.       Final diagnoses:  Arm pain, central, right    ED Discharge Orders          Ordered     methocarbamol  (ROBAXIN ) 500 MG tablet  Every 8 hours PRN        12/15/23 1705               Patsey Lot, MD  12/15/23 1711  

## 2023-12-15 NOTE — ED Triage Notes (Signed)
 Thinks she pulled a muscle

## 2023-12-15 NOTE — Discharge Instructions (Signed)
 Take the new medicine to help with the pain.  Follow-up with your doctor.

## 2023-12-15 NOTE — ED Triage Notes (Signed)
 Pt is weak on right side from prior stroke.

## 2023-12-15 NOTE — ED Triage Notes (Signed)
 Yesterday right arm /hand was numb when she wokeup , thought she had slept wrong. The numbness has went away but from shoulder to elbow is throbbing.

## 2023-12-21 ENCOUNTER — Ambulatory Visit (HOSPITAL_BASED_OUTPATIENT_CLINIC_OR_DEPARTMENT_OTHER): Admitting: Family

## 2024-01-01 ENCOUNTER — Encounter: Payer: 59 | Admitting: Psychology

## 2024-03-07 ENCOUNTER — Encounter: Attending: Physical Medicine & Rehabilitation | Admitting: Physical Medicine & Rehabilitation

## 2024-03-07 ENCOUNTER — Encounter: Payer: Self-pay | Admitting: Physical Medicine & Rehabilitation

## 2024-03-07 VITALS — BP 127/76 | HR 79 | Ht 63.0 in | Wt 145.8 lb

## 2024-03-07 DIAGNOSIS — I69398 Other sequelae of cerebral infarction: Secondary | ICD-10-CM | POA: Insufficient documentation

## 2024-03-07 DIAGNOSIS — R269 Unspecified abnormalities of gait and mobility: Secondary | ICD-10-CM | POA: Insufficient documentation

## 2024-03-07 NOTE — Progress Notes (Signed)
 Subjective:    Patient ID: Christina Orozco, female    DOB: [birth year], 53 y.o.   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  She ambulates with a walker.  She does not drive.  She has had no new strokes since June of this year.  She continues to have problems with her balance.  She has some dizziness with position changes.  Denies any visual problems.  No swallowing issues.  She does have reduced sense of taste since her last stroke. Hgb A1C 5.7 on Ozempic  Home health Physical therapy  Pain Inventory Average Pain 8 Pain Right Now 7 My pain is aching  In the last 24 hours, has pain interfered with the following? General activity 0 Relation with others 0 Enjoyment of life 0 What TIME of day is your pain at its worst? varies Sleep (in general) Good  Pain is worse with: some activites Pain improves with: na Relief from Meds: na  Family History  Problem Relation Age of Onset   Vascular Disease Mother    CAD Mother    Heart failure Mother    Heart disease Other    Cancer Other        colon, stomach, pancreatic, lung   Diabetes Other    Breast cancer Sister 30   Seizures Maternal Uncle    Social History   Socioeconomic History   Marital status: Single    Spouse name: Not on file   Number of children: 3   Years of education: 12+   Highest education level: Some college, no degree  Occupational History   Not on file  Tobacco Use   Smoking status: Never   Smokeless tobacco: Never  Vaping Use   Vaping status: Never Used  Substance and Sexual Activity   Alcohol  use: No   Drug use: No   Sexual activity: Not Currently  Other Topics Concern   Not on file  Social History Narrative   Lives with daughter   Caffeine use: once in a blue moon   Right handed   Social Drivers of Health   Financial Resource Strain: Medium Risk (09/06/2022)   Received from [facility]   Overall Financial Resource  Strain (CARDIA)    Difficulty of Paying Living Expenses: Somewhat hard  Food Insecurity: No Food Insecurity (10/15/2023)   Hunger Vital Sign    Worried About Running Out of Food in the Last Year: Never true    Ran Out of Food in the Last Year: Never true  Transportation Needs: No Transportation Needs (10/15/2023)   PRAPARE - Administrator, Civil Service (Medical): No    Lack of Transportation (Non-Medical): No  Physical Activity: Not on file  Stress: Not on file  Social Connections: Unknown (10/10/2021)   Received from [facility]   Social Network    Social Network: Not on file   Past Surgical History:  Procedure Laterality Date   ADENOIDECTOMY     APPENDECTOMY     CERVICAL SPINE SURGERY  06/2012   C5-C6   EYE SURGERY     left eye surgery    FOOT SURGERY     KENALOG  INJECTION Left 03/10/2021   Procedure: Intravitreal Avastin  Injection;  Surgeon: Jarold Mayo, MD;  Location: [facility] OR;  Service: Ophthalmology;  Laterality: Left;   LOOP RECORDER INSERTION N/A 08/27/2017   Procedure: LOOP RECORDER INSERTION;  Surgeon: Francyne Headland, MD;  Location: [facility] INVASIVE CV LAB;  Service: Cardiovascular;  Laterality: N/A;   MEMBRANE PEEL Left 03/10/2021   Procedure: MEMBRANE PEEL;  Surgeon: Jarold Mayo, MD;  Location: [facility] OR;  Service: Ophthalmology;  Laterality: Left;   PARS PLANA VITRECTOMY Left 03/10/2021   Procedure: TWENTY-FIVE GAUGE PARS PLANA VITRECTOMY;  Surgeon: Jarold Mayo, MD;  Location: [facility] OR;  Service: Ophthalmology;  Laterality: Left;   PHOTOCOAGULATION WITH LASER Left 03/10/2021   Procedure: PHOTOCOAGULATION WITH LASER;  Surgeon: Jarold Mayo, MD;  Location: [facility] OR;  Service: Ophthalmology;  Laterality: Left;   SHOULDER SURGERY     TEE WITHOUT CARDIOVERSION N/A 08/27/2017   Procedure: TRANSESOPHAGEAL ECHOCARDIOGRAM (TEE);  Surgeon: Francyne Headland, MD;  Location: [facility] ENDOSCOPY;  Service: Cardiovascular;  Laterality: N/A;   TONSILLECTOMY     Past Surgical History:   Procedure Laterality Date   ADENOIDECTOMY     APPENDECTOMY     CERVICAL SPINE SURGERY  06/2012   C5-C6   EYE SURGERY     left eye surgery    FOOT SURGERY     KENALOG  INJECTION Left 03/10/2021   Procedure: Intravitreal Avastin  Injection;  Surgeon: Jarold Mayo, MD;  Location: [facility] OR;  Service: Ophthalmology;  Laterality: Left;   LOOP RECORDER INSERTION N/A 08/27/2017   Procedure: LOOP RECORDER INSERTION;  Surgeon: Francyne Headland, MD;  Location: [facility] INVASIVE CV LAB;  Service: Cardiovascular;  Laterality: N/A;   MEMBRANE PEEL Left 03/10/2021   Procedure: MEMBRANE PEEL;  Surgeon: Jarold Mayo, MD;  Location: [facility] OR;  Service: Ophthalmology;  Laterality: Left;   PARS PLANA VITRECTOMY Left 03/10/2021   Procedure: TWENTY-FIVE GAUGE PARS PLANA VITRECTOMY;  Surgeon: Jarold Mayo, MD;  Location: [facility] OR;  Service: Ophthalmology;  Laterality: Left;   PHOTOCOAGULATION WITH LASER Left 03/10/2021   Procedure: PHOTOCOAGULATION WITH LASER;  Surgeon: Jarold Mayo, MD;  Location: [facility] OR;  Service: Ophthalmology;  Laterality: Left;   SHOULDER SURGERY     TEE WITHOUT CARDIOVERSION N/A 08/27/2017   Procedure: TRANSESOPHAGEAL ECHOCARDIOGRAM (TEE);  Surgeon: Francyne Headland, MD;  Location: [facility] ENDOSCOPY;  Service: Cardiovascular;  Laterality: N/A;   TONSILLECTOMY     Past Medical History:  Diagnosis Date   Anemia    severe   Anxiety    Asthma    CKD (chronic kidney disease)    stage IV   DDD (degenerative disc disease), cervical    Depression    Diabetes mellitus (HCC)    Dislocated shoulder    right   GERD (gastroesophageal reflux disease)    Headache    migraines (about once a month)   History of kidney stones    Hypertension    Neuropathy    Peripheral vascular disease    Pneumonia    Stroke (HCC)    BP 127/76   Pulse 79   Ht 5' 3 (1.6 m)   Wt 145 lb 12.8 oz (66.1 kg)   LMP 05/22/2018 (Within Days)   SpO2 98%   BMI 25.83 kg/m   Opioid Risk Score:   Fall Risk Score:   `1  Depression screen [facility] 2/9     03/07/2024   12:54 PM 12/07/2023   11:04 AM 10/15/2023    9:46 AM 05/08/2023    9:26 AM 10/10/2022   10:28 AM 04/27/2022    3:13 PM 03/02/2022   10:32 AM  Depression screen PHQ 2/9  Decreased Interest 3 1 0 0 0 1   Down, Depressed, Hopeless 3 1 0 0 0 1   PHQ - 2 Score 6 2 0 0 0 2  Altered sleeping    0   1  Tired, decreased energy    1   2  Change in appetite    1     Feeling bad or failure about yourself     1     Trouble concentrating    1     Moving slowly or fidgety/restless    0     Suicidal thoughts    0     PHQ-9 Score    4     Difficult doing work/chores    Somewhat difficult       Review of Systems  Musculoskeletal:  Positive for gait problem.       Objective:   Physical Exam   Neuro:  Eyes without evidence of nystagmus  Tone is normal without evidence of spasticity Cerebellar exam shows  evidence of mild ataxia on finger nose finger & heel to shin testing Right side  No evidence of trunkal ataxia  Motor strength is 4/5 in bilateral deltoid, biceps, triceps, finger flexors and extensors, wrist flexors and extensors, hip flexors, knee flexors and extensors, ankle dorsiflexors, plantar flexors, invertors and evertors, toe flexors and extensors, Right weaker than left   Sensory exam is reduced to pinprick, light touch in the left upper  limbs   +dysdiadochokinesis R>L with rapid alternating sup/pron upper limbs  Cranial nerves II- Visual fields are intact to confrontation testing, no blurring of vision III- no evidence of ptosis, upward, downward and medial gaze intact IV- no vertical diplopia or head tilt V- left  facial numbness , no masseter weakness VI- no pupil abduction weakness VII- mild left facial droop, good lid closure VII- normal auditory acuity IX- no pharygeal weakness, X- no pharyngeal weakness, no hoarseness XI- no trap or SCM weakness XII- no glossal weakness       Assessment & Plan:  1.Recent left  pontine infarct superimposed on previous right frontal cortical infarcts as well as bilateral subcortical and thalamic infarcts. Small vessel disease as primary etiology and has been followed by neurology for this. Her functional decline from this most recent stroke is fortunately mild but she does require some additional physical therapy.  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  She has been checked by her dentist and no oral health problems. She is seeing neurology, endocrinology, primary care, psychology, nephrology, all at [facility].  Her rehab needs are fairly simple at this point, home health PT, no new equipment needs.  She does not have any severe spasticity issues.  I do think she can follow-up with me on as-needed basis.

## 2024-03-11 ENCOUNTER — Emergency Department (HOSPITAL_BASED_OUTPATIENT_CLINIC_OR_DEPARTMENT_OTHER)

## 2024-03-11 ENCOUNTER — Other Ambulatory Visit: Payer: Self-pay

## 2024-03-11 ENCOUNTER — Emergency Department (HOSPITAL_BASED_OUTPATIENT_CLINIC_OR_DEPARTMENT_OTHER)
Admission: EM | Admit: 2024-03-11 | Discharge: 2024-03-12 | Disposition: A | Discharge status: Other Acute Inpatient Hospital | Source: Ambulatory Visit | Attending: Emergency Medicine | Admitting: Emergency Medicine

## 2024-03-11 ENCOUNTER — Encounter (HOSPITAL_BASED_OUTPATIENT_CLINIC_OR_DEPARTMENT_OTHER): Payer: Self-pay | Admitting: *Deleted

## 2024-03-11 DIAGNOSIS — J4520 Mild intermittent asthma, uncomplicated: Secondary | ICD-10-CM | POA: Insufficient documentation

## 2024-03-11 DIAGNOSIS — H579 Unspecified disorder of eye and adnexa: Secondary | ICD-10-CM | POA: Diagnosis present

## 2024-03-11 DIAGNOSIS — I129 Hypertensive chronic kidney disease with stage 1 through stage 4 chronic kidney disease, or unspecified chronic kidney disease: Secondary | ICD-10-CM | POA: Diagnosis not present

## 2024-03-11 DIAGNOSIS — Z7982 Long term (current) use of aspirin: Secondary | ICD-10-CM | POA: Diagnosis not present

## 2024-03-11 DIAGNOSIS — Z8673 Personal history of transient ischemic attack (TIA), and cerebral infarction without residual deficits: Secondary | ICD-10-CM | POA: Insufficient documentation

## 2024-03-11 DIAGNOSIS — E1122 Type 2 diabetes mellitus with diabetic chronic kidney disease: Secondary | ICD-10-CM | POA: Diagnosis not present

## 2024-03-11 DIAGNOSIS — Z79899 Other long term (current) drug therapy: Secondary | ICD-10-CM | POA: Diagnosis not present

## 2024-03-11 DIAGNOSIS — N184 Chronic kidney disease, stage 4 (severe): Secondary | ICD-10-CM | POA: Insufficient documentation

## 2024-03-11 DIAGNOSIS — E1140 Type 2 diabetes mellitus with diabetic neuropathy, unspecified: Secondary | ICD-10-CM | POA: Insufficient documentation

## 2024-03-11 DIAGNOSIS — H3321 Serous retinal detachment, right eye: Secondary | ICD-10-CM | POA: Diagnosis not present

## 2024-03-11 DIAGNOSIS — R930 Abnormal findings on diagnostic imaging of skull and head, not elsewhere classified: Secondary | ICD-10-CM | POA: Diagnosis not present

## 2024-03-11 DIAGNOSIS — Z7951 Long term (current) use of inhaled steroids: Secondary | ICD-10-CM | POA: Insufficient documentation

## 2024-03-11 LAB — COMPREHENSIVE METABOLIC PANEL WITH GFR
ALT: 22 U/L (ref 0–44)
AST: 22 U/L (ref 15–41)
Albumin: 4.6 g/dL (ref 3.5–5.0)
Alkaline Phosphatase: 60 U/L (ref 38–126)
Anion gap: 15 (ref 5–15)
BUN: 40 mg/dL — ABNORMAL HIGH (ref 6–20)
CO2: 18 mmol/L — ABNORMAL LOW (ref 22–32)
Calcium: 9.8 mg/dL (ref 8.9–10.3)
Chloride: 112 mmol/L — ABNORMAL HIGH (ref 98–111)
Creatinine, Ser: 3.38 mg/dL — ABNORMAL HIGH (ref 0.44–1.00)
GFR, Estimated: 16 mL/min — ABNORMAL LOW (ref >60)
Glucose, Bld: 131 mg/dL — ABNORMAL HIGH (ref 70–99)
Potassium: 4 mmol/L (ref 3.5–5.1)
Sodium: 145 mmol/L (ref 135–145)
Total Bilirubin: 0.4 mg/dL (ref 0.0–1.2)
Total Protein: 8 g/dL (ref 6.5–8.1)

## 2024-03-11 LAB — PROTIME-INR
INR: 1 (ref 0.8–1.2)
Prothrombin Time: 13.7 s (ref 11.4–15.2)

## 2024-03-11 LAB — DIFFERENTIAL
Abs Immature Granulocytes: 0.01 K/uL (ref 0.00–0.07)
Basophils Absolute: 0 K/uL (ref 0.0–0.1)
Basophils Relative: 1 %
Eosinophils Absolute: 0.1 K/uL (ref 0.0–0.5)
Eosinophils Relative: 4 %
Immature Granulocytes: 0 %
Lymphocytes Relative: 41 %
Lymphs Abs: 1.5 K/uL (ref 0.7–4.0)
Monocytes Absolute: 0.2 K/uL (ref 0.1–1.0)
Monocytes Relative: 5 %
Neutro Abs: 1.8 K/uL (ref 1.7–7.7)
Neutrophils Relative %: 49 %

## 2024-03-11 LAB — CBC
HCT: 29.5 % — ABNORMAL LOW (ref 36.0–46.0)
Hemoglobin: 9.5 g/dL — ABNORMAL LOW (ref 12.0–15.0)
MCH: 29.3 pg (ref 26.0–34.0)
MCHC: 32.2 g/dL (ref 30.0–36.0)
MCV: 91 fL (ref 80.0–100.0)
Platelets: 209 K/uL (ref 150–400)
RBC: 3.24 MIL/uL — ABNORMAL LOW (ref 3.87–5.11)
RDW: 15.5 % (ref 11.5–15.5)
WBC: 3.7 K/uL — ABNORMAL LOW (ref 4.0–10.5)
nRBC: 0 % (ref 0.0–0.2)

## 2024-03-11 LAB — APTT: aPTT: 31 s (ref 24–36)

## 2024-03-11 LAB — CBG MONITORING, ED: Glucose-Capillary: 127 mg/dL — ABNORMAL HIGH (ref 70–99)

## 2024-03-11 NOTE — ED Provider Triage Note (Signed)
 Emergency Medicine Provider Triage Evaluation Note  Lamonda Noxon , a 53 y.o. female  was evaluated in triage.  Pt complains of right eye black spots in medial vision field that started at 1100 today. No eye pain, loss of vision, slurred speech, aphasia. Hx of CVA in past with deficit of RLE weakness and walks with walker  Review of Systems  Positive: See hpi Negative:   Physical Exam  BP (!) 154/85   Pulse 81   Temp 98.1 F (36.7 C)   Resp 16   LMP 05/22/2018 (Within Days)   SpO2 100%  Gen:   Awake, no distress   Resp:  Normal effort  MSK:   Moves extremities without difficulty  Other:  Motor 5/5 and sensation 2/2 of BUE and BLE. No aphasia nor slurred speech  Medical Decision Making  Medically screening exam initiated at 6:43 PM.  Appropriate orders placed.  Nautia Chizek was informed that the remainder of the evaluation will be completed by another provider, this initial triage assessment does not replace that evaluation, and the importance of remaining in the ED until their evaluation is complete.  >6 hours post onset of symptoms. Ordered labs and CTA   Minnie Tinnie BRAVO, [place] 03/11/24 [phone]

## 2024-03-11 NOTE — Telephone Encounter (Signed)
 Copied from CRM #[id number]. Topic: Clinical Concerns - Emergent Call >> Mar 11, 2024  4:26 PM Moira BROCKS wrote: Buena, Deeana is calling for clinical concerns (Ask: What symptoms are you calling about today, AND how long have you had these symptoms? Must Review HPKW list for symptoms) Document Name of Triage Nurse/BH Rep taking the call when applicable)   Include all details related to the request(s) below:   Patient can see out of eye but is seeing 4 black dots . Attempted to transfer to triage no response. Advised to go to Urgent Care or Emergency Department  if it gets worse   Confirm and type the Best Contact Number below:  Patient/caller contact number:        [phone] scotty      [] Home  [x] Mobile  [] Work [] Other   [x] Okay to leave a voicemail   Medication List:  Current Outpatient Medications:  .  acetaminophen  (TYLENOL ) 325 mg tablet, Take 650 mg by mouth every 6 (six) hours as needed (pain)., Disp: , Rfl:  .  albuterol  HFA (PROVENTIL  HFA;VENTOLIN  HFA;PROAIR  HFA) 90 mcg/actuation inhaler, INHALE 1 TO 2 PUFFS INTO THE LUNGS EVERY 6 (SIX) HOURS AS NEEDED FOR WHEEZING OR SHORTNESS OF BREATH., Disp: 8.5 g, Rfl: 3 .  amLODIPine  (NORVASC ) 10 mg tablet, Take 1 tablet (10 mg total) by mouth daily., Disp: 90 tablet, Rfl: 3 .  aspirin  81 mg EC tablet, Take 81 mg by mouth 2 (two) times a day., Disp: , Rfl:  .  atorvastatin  (LIPITOR ) 80 mg tablet, TAKE 1 TABLET BY MOUTH DAILY FOR CHOLESTEROL (BEDTIME), Disp: 90 tablet, Rfl: 2 .  blood-glucose meter,receiver,continuous (FreeStyle Libre 3 Reader), Use as directed with compatible sensors to monitor blood sugar levels., Disp: 1 each, Rfl: 0 .  blood-glucose sensor (FreeStyle Libre 3 Plus Sensor), Use as directed to monitor blood sugar levels. Follow package directions for replacement., Disp: 2 each, Rfl: 5 .  carboxymethylcellulose (REFRESH PLUS) 0.5 % dpet, Administer 1 drop into both eyes 3 (three) times a day as needed (dry eyes)., Disp:  , Rfl:  .  carvediloL  (COREG ) 12.5 mg tablet, TAKE ONE TABLET BY MOUTH TWICE DAILY, Disp: 180 tablet, Rfl: 0 .  diclofenac sodium (VOLTAREN) 1 % gel, Apply  topically 2 (two) times a day., Disp: 100 g, Rfl: 3 .  FLUoxetine  (PROzac ) 20 mg capsule, Take 1 capsule (20 mg total) by mouth daily., Disp: 90 capsule, Rfl: 1 .  fluticasone  propionate (FLONASE ) 50 mcg/spray nasal spray, Administer 2 sprays into each nostril Once Daily., Disp: 16 g, Rfl: 0 .  Lancets misc, 1 lancet 3 times a day, Disp: 100 each, Rfl: 3 .  meclizine  (ANTIVERT ) 25 mg tablet, TAKE 1 TABLET (25 MG TOTAL) BY MOUTH 3 (THREE) TIMES DAILY AS NEEDED FOR UP TO 30 DOSES FOR DIZZINESS., Disp: , Rfl:  .  miscellaneous medical supply (C-Tub) misc, Please dispense medium adult diapers, Disp: 100 each, Rfl: 3 .  miscellaneous medical supply (C-Tub) misc, Please dispense one rollator walker with seat and hand brakes, Disp: 1 each, Rfl: 0 .  miscellaneous medical supply misc, Please dispense one bedside commode, Disp: 1 each, Rfl: 0 .  montelukast  (SINGULAIR ) 10 mg tablet, TAKE 1 TABLET (10 MG TOTAL) BY MOUTH DAILY. (AM), Disp: 90 tablet, Rfl: 2 .  ondansetron  (ZOFRAN -ODT) 4 mg disintegrating tablet, TAKE 1 TABLET (4 MG TOTAL) BY MOUTH EVERY 8 (EIGHT) HOURS AS NEEDED FOR UP TO 15 DOSES FOR NAUSEA OR VOMITING., Disp: , Rfl:  .  Ozempic  0.25 mg or 0.5 mg (2 mg/3 mL) subcutaneous pen injector, INJECT 0.5 MG INTO THE SKIN ONCE A WEEK, Disp: 3 mL, Rfl: 3 .  pantoprazole  (PROTONIX ) 40 mg EC tablet, Take 1 tablet (40 mg total) by mouth 2 (two) times a day., Disp: 180 tablet, Rfl: 3 .  pen needle, diabetic 31 gauge x 3/16 ndle, BD Ultra-Fine Mini Pen Needle 31 gauge x 3/16.  Use one daily., Disp: 100 each, Rfl: 1 .  polysaccharide iron  complex (Nu-Iron ) 150 mg iron  capsule, Take 1 capsule (150 mg total) by mouth 2 (two) times a day., Disp: 60 capsule, Rfl: 6 .  sennosides-docusate sodium  (PERICOLACE) 8.6-50 mg per tablet, Take 1 tablet by mouth 2  (two) times a day., Disp: , Rfl:  .  sodium bicarbonate  650 mg tablet, Take 1 tablet (650 mg total) by mouth 2 (two) times a day., Disp: 60 tablet, Rfl: 6 .  ticagrelor  (Brilinta ) 90 mg tablet, Take 90 mg by mouth 2 (two) times a day., Disp: , Rfl:  .  zinc citrate 11 mg chew, Chew 1 tablet (11 mg total) daily., Disp: 90 tablet, Rfl: 1     Medication Request/Refills: Pharmacy Information (if applicable)   [] Not Applicable       []  Pharmacy listed  Send Medication Request to:                                                 [] Pharmacy not listed (added to pharmacy list in Epic) Send Medication Request to:      Listed Pharmacies: [facility] - [place], [place] - [address] - PHONE: [phone] - FAX: [phone] [facility]

## 2024-03-11 NOTE — ED Triage Notes (Signed)
 Pt POV reporting seeing black spots in R eye since 1130 this AM. Hx multiple strokes. Lauren PA in triage to evaluate.

## 2024-03-11 NOTE — ED Provider Notes (Signed)
 [place] EMERGENCY DEPARTMENT AT [facility] Provider Note  CSN: [id number] Arrival date & time: 03/11/24 1821  Chief Complaint(s) Visual Field Change  HPI Quetzal Meany is a 53 y.o. female who is here today for sudden onset partial vision loss in her right eye.  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  She states that she was at home, noticed that she was no longer able to see out of the middle portion of her right eye.  She has no other new deficits.  No headache.   Past Medical History Past Medical History:  Diagnosis Date   Anemia    severe   Anxiety    Asthma    CKD (chronic kidney disease)    stage IV   DDD (degenerative disc disease), cervical    Depression    Diabetes mellitus (HCC)    Dislocated shoulder    right   GERD (gastroesophageal reflux disease)    Headache    migraines (about once a month)   History of kidney stones    Hypertension    Neuropathy    Peripheral vascular disease    Pneumonia    Stroke [facility])    Patient Active Problem List   Diagnosis Date Noted   Left pontine stroke (HCC) 09/21/2023   Fecal occult blood test positive 04/23/2023   Stroke (cerebrum) (HCC) 04/17/2023   Acute ischemic stroke (HCC) 04/14/2023   Prolonged QT interval 04/14/2023   Diplopia 10/31/2022   OSA (obstructive sleep apnea) 08/25/2022   Subcortical infarction (HCC) 08/23/2022   Overweight (BMI 25.0-29.9) 08/23/2022   Asthma, chronic 08/22/2022   TIA (transient ischemic attack) 06/20/2022   Numbness and tingling in left hand 03/02/2022   Gait disturbance, post-stroke 03/02/2022   AKI (acute kidney injury) 12/23/2021   Acute embolic stroke (HCC) 12/23/2021   Hypernatremia 12/20/2021   CKD (chronic kidney disease), stage IV (HCC) 12/19/2021   Left-sided weakness and  visual deficits  12/19/2021   Vision abnormalities 12/19/2021   Acute stroke due to ischemia (HCC) 11/10/2021   Depression 07/05/2020   CVA (cerebral vascular accident) (HCC) 06/21/2020   Generalized anxiety disorder 03/01/2019   Abnormality of gait 10/14/2018   Dizziness and giddiness 09/13/2018   Generalized weakness 06/26/2018   Chronic nonintractable headache 04/26/2018   Diastolic dysfunction    Congestion of nasal sinus    Pneumonia of left lower lobe due to infectious organism    SOB (shortness of breath)    Candidiasis    History of anemia due to chronic kidney disease    Acute on chronic anemia    Hypoalbuminemia due to protein-calorie malnutrition    Hypertension associated with diabetes (HCC)    Type 2 diabetes mellitus with chronic kidney disease, without long-term current use of insulin  (HCC)    PRES (posterior reversible encephalopathy syndrome)    Hypokalemia    History of CVA with residual deficit    History of seizure 03/25/2018   Bilateral carpal tunnel syndrome 02/14/2018   Arthritis 10/31/2017   Asthma 10/31/2017   Constipation 10/31/2017   Kidney stone 10/31/2017   Anemia, chronic disease 08/09/2017   Hyperlipidemia associated with type 2 diabetes mellitus (HCC) 07/26/2017   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HCC) 07/25/2017   Anxiety as acute reaction to exceptional stress 04/23/2017   Insomnia 10/08/2016   Neuropathy 06/07/2016   Gait instability    Lesion of  pons    Slurred speech    Vertigo 04/30/2016   Hyperglycemia 04/30/2016   Chronic kidney disease (CKD), stage III (moderate) (HCC) 04/30/2016   Ataxia 04/30/2016   Chronic right shoulder pain 04/03/2016   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 Authorizing Provider  albuterol  (PROAIR  HFA) 108 (90 Base) MCG/ACT inhaler INHALE 1 OR 2 PUFFS INTO THE LUNGS EVERY 6 HOURS AS NEEDED FOR WHEEZING OR SHORTNESS OF BREATH 04/12/18   Angiulli, Toribio PARAS, PA-C  amLODipine  (NORVASC ) 10 MG tablet TAKE 1 TABLET (10 MG TOTAL) BY MOUTH DAILY. (AM) Patient taking differently: Take 10 mg by mouth in the morning. 03/05/23   Vannie Reche RAMAN, NP  aspirin  EC 81 MG tablet Take 1 tablet (81 mg total) by mouth 2 (two) times daily. Swallow whole. 09/29/23   Adhikari, Amrit, MD  atorvastatin  (LIPITOR ) 80 MG tablet Take 1 tablet (80 mg total) by mouth at bedtime. 10/05/22   Walker, Caitlin S, NP  carvedilol  (COREG ) 12.5 MG tablet Take 1 tablet (12.5 mg total) by mouth 2 (two) times daily with a meal. 08/28/23   Walker, Caitlin S, NP  diazepam  (VALIUM ) 2 MG tablet Take 1 tablet (2 mg total) by mouth every 12 (twelve) hours as needed for up to 12 doses (vertigo). 11/23/23   Cottie Donnice PARAS, MD  FLUoxetine  (PROZAC ) 20 MG capsule Take 20 mg by mouth daily.    [provider]  fluticasone  (FLONASE ) 50 MCG/ACT nasal spray Place 2 sprays into both nostrils daily as needed for allergies or rhinitis. 04/27/23   Love, Sharlet RAMAN, PA-C  meclizine  (ANTIVERT ) 25 MG tablet Take 1 tablet (25 mg total) by mouth 3 (three) times daily as needed for up to 30 doses for dizziness. 11/23/23   Cottie Donnice PARAS, MD  melatonin 5 MG TABS Take 1 tablet (5 mg total) by mouth at bedtime as needed. 04/27/23   Love, Sharlet RAMAN, PA-C  methocarbamol  (ROBAXIN ) 500 MG tablet Take 1 tablet (500 mg total) by mouth every 8 (eight) hours as needed. 12/15/23   Patsey Lot, MD  montelukast  (SINGULAIR ) 10 MG tablet Take 10 mg by mouth at bedtime.    [provider]  naphazoline-glycerin  (CLEAR EYES REDNESS) 0.012-0.25 % SOLN Place 1-2 drops into the left eye 2 (two) times daily. 04/24/23   Love, Sharlet RAMAN, PA-C  ondansetron  (ZOFRAN -ODT) 4 MG  disintegrating tablet Take 1 tablet (4 mg total) by mouth every 8 (eight) hours as needed for up to 15 doses for nausea or vomiting. 11/23/23   Cottie Donnice PARAS, MD  OZEMPIC , 0.25 OR 0.5 MG/DOSE, 2 MG/3ML SOPN Inject 0.5 mg into the skin See admin instructions. Inject 0.5 mg subcutaneous every 10 days 04/14/22   [provider]  pantoprazole  (PROTONIX ) 40 MG tablet Take 1 tablet (40 mg total) by mouth 2 (two) times daily. 04/27/23   Love, Sharlet RAMAN, PA-C  senna-docusate (SENOKOT-S) 8.6-50 MG tablet Take 1 tablet by mouth 2 (two) times daily. 09/29/23   Jillian Buttery, MD  sodium bicarbonate  650 MG tablet Take 1 tablet (650 mg total) by mouth 3 (three) times daily. 09/29/23   Jillian Buttery, MD  ticagrelor  (BRILINTA ) 90 MG TABS tablet Take 1 tablet (90 mg total)  by mouth 2 (two) times daily. 09/29/23   Jillian Buttery, MD                                                                                                                                    Past Surgical History Past Surgical History:  Procedure Laterality Date   ADENOIDECTOMY     APPENDECTOMY     CERVICAL SPINE SURGERY  06/2012   C5-C6   EYE SURGERY     left eye surgery    FOOT SURGERY     KENALOG  INJECTION Left 03/10/2021   Procedure: Intravitreal Avastin  Injection;  Surgeon: Jarold Mayo, MD;  Location: [facility] OR;  Service: Ophthalmology;  Laterality: Left;   LOOP RECORDER INSERTION N/A 08/27/2017   Procedure: LOOP RECORDER INSERTION;  Surgeon: Francyne Headland, MD;  Location: [facility] INVASIVE CV LAB;  Service: Cardiovascular;  Laterality: N/A;   MEMBRANE PEEL Left 03/10/2021   Procedure: MEMBRANE PEEL;  Surgeon: Jarold Mayo, MD;  Location: [facility] OR;  Service: Ophthalmology;  Laterality: Left;   PARS PLANA VITRECTOMY Left 03/10/2021   Procedure: TWENTY-FIVE GAUGE PARS PLANA VITRECTOMY;  Surgeon: Jarold Mayo, MD;  Location: [facility] OR;  Service: Ophthalmology;  Laterality: Left;   PHOTOCOAGULATION WITH LASER Left 03/10/2021   Procedure:  PHOTOCOAGULATION WITH LASER;  Surgeon: Jarold Mayo, MD;  Location: [facility] OR;  Service: Ophthalmology;  Laterality: Left;   SHOULDER SURGERY     TEE WITHOUT CARDIOVERSION N/A 08/27/2017   Procedure: TRANSESOPHAGEAL ECHOCARDIOGRAM (TEE);  Surgeon: Francyne Headland, MD;  Location: [facility] ENDOSCOPY;  Service: Cardiovascular;  Laterality: N/A;   TONSILLECTOMY     Family History Family History  Problem Relation Age of Onset   Vascular Disease Mother    CAD Mother    Heart failure Mother    Heart disease Other    Cancer Other        colon, stomach, pancreatic, lung   Diabetes Other    Breast cancer Sister 30   Seizures Maternal Uncle     Social History Social History   Tobacco Use   Smoking status: Never   Smokeless tobacco: Never  Vaping Use   Vaping status: Never Used  Substance Use Topics   Alcohol  use: No   Drug use: No   Allergies Lisinopril , Penicillins, Quetiapine, and Gabapentin   Review of Systems Review of Systems  Physical Exam Vital Signs  I have reviewed the triage vital signs BP (!) 162/83   Pulse 73   Temp 98.2 F (36.8 C)   Resp 11   LMP 05/22/2018 (Within Days)   SpO2 100%   Physical Exam Vitals and nursing note reviewed.  Eyes:     General:        Right eye: No discharge.        Left eye: No discharge.     Extraocular Movements: Extraocular movements intact.     Pupils: Pupils are equal, round, and reactive to light.  Cardiovascular:     Rate and Rhythm: Normal rate.  Musculoskeletal:     Cervical back: Normal range of motion.  Neurological:     Mental Status: She is alert.     Comments: Unilateral partial hemianopsia involving the medial field of the right eye.  Patient with residual left-sided weakness.  Normal speech.     ED Results and Treatments Labs (all labs ordered are listed, but only abnormal results are displayed) Labs Reviewed  CBC - Abnormal; Notable for the following components:      Result Value   WBC 3.7 (*)    RBC 3.24 (*)     Hemoglobin 9.5 (*)    HCT 29.5 (*)    All other components within normal limits  COMPREHENSIVE METABOLIC PANEL WITH GFR - Abnormal; Notable for the following components:   Chloride 112 (*)    CO2 18 (*)    Glucose, Bld 131 (*)    BUN 40 (*)    Creatinine, Ser 3.38 (*)    GFR, Estimated 16 (*)    All other components within normal limits  CBG MONITORING, ED - Abnormal; Notable for the following components:   Glucose-Capillary 127 (*)    All other components within normal limits  PROTIME-INR  APTT  DIFFERENTIAL                                                                                                                          Radiology CT Head Wo Contrast Result Date: 03/11/2024 EXAM: CT HEAD WITHOUT CONTRAST 03/11/2024 09:24:00 PM TECHNIQUE: CT of the head was performed without the administration of intravenous contrast. Automated exposure control, iterative reconstruction, and/or weight based adjustment of the mA/kV was utilized to reduce the radiation dose to as low as reasonably achievable. COMPARISON: CT head 11/23/2023 CLINICAL HISTORY: Ataxia, head trauma. Pt POV reporting seeing black spots in R eye since 1130 this AM. Hx multiple strokes. Lauren PA in triage to evaluate. FINDINGS: BRAIN AND VENTRICLES: No acute hemorrhage. No evidence of acute infarct. No hydrocephalus. No extra-axial collection. No mass effect or midline shift. Chronic bilateral thalamic infarcts. Chronic left basal ganglia infarct. ORBITS: No acute abnormality. SINUSES: No acute abnormality. SOFT TISSUES AND SKULL: No acute soft tissue abnormality. No skull fracture. IMPRESSION: 1. No acute intracranial abnormality. Electronically signed by: Norman Gatlin MD 03/11/2024 09:32 PM EDT RP Workstation: [id number]    Pertinent labs & imaging results that were available during my care of the patient were reviewed by me and considered in my medical decision making (see MDM for details).  Medications Ordered in  ED Medications - No data to display  Procedures .Critical Care  Performed by: Mannie Fairy DASEN, DO Authorized by: Mannie Fairy DASEN, DO   Critical care provider statement:    Critical care time (minutes):  35   Critical care was necessary to treat or prevent imminent or life-threatening deterioration of the following conditions:  CNS failure or compromise   Critical care was time spent personally by me on the following activities:  Development of treatment plan with patient or surrogate, discussions with consultants, evaluation of patient's response to treatment, examination of patient, ordering and review of laboratory studies, ordering and review of radiographic studies, ordering and performing treatments and interventions, pulse oximetry, re-evaluation of patient's condition and review of old charts   (including critical care time)  Medical Decision Making / ED Course   This patient presents to the ED for concern of vision loss, this involves an extensive number of treatment options, and is a complaint that carries with it a high risk of complications and morbidity.  The differential diagnosis includes detachment, CVA, optic nerve compression.  MDM: Performed a bedside ultrasound on the patient which does appear to show a partial retinal attachment.  Did reach out to our on-call ophthalmologist, Dr. Corbin, however they do not manage retinal detachments.  Have reached out to [facility] etc to speak with their ophthalmologist.  Reassessment 11 PM-spoke with ophthalmologist Dr. Orie at [facility] etc. I shared my ultrasound images with him and he agreed that this is a partial retinal attachment.  Recommended ED to ED transfer.  Discussed this with the patient she was agreeable with this plan.  Our staff is  currently arranging transportation.  Additional history obtained: -Additional history obtained from daughter at bedside -External records from outside source obtained and reviewed including: Chart review including previous notes, labs, imaging, consultation notes   Lab Tests: -I ordered, reviewed, and interpreted labs.   The pertinent results include:   Labs Reviewed  CBC - Abnormal; Notable for the following components:      Result Value   WBC 3.7 (*)    RBC 3.24 (*)    Hemoglobin 9.5 (*)    HCT 29.5 (*)    All other components within normal limits  COMPREHENSIVE METABOLIC PANEL WITH GFR - Abnormal; Notable for the following components:   Chloride 112 (*)    CO2 18 (*)    Glucose, Bld 131 (*)    BUN 40 (*)    Creatinine, Ser 3.38 (*)    GFR, Estimated 16 (*)    All other components within normal limits  CBG MONITORING, ED - Abnormal; Notable for the following components:   Glucose-Capillary 127 (*)    All other components within normal limits  PROTIME-INR  APTT  DIFFERENTIAL       Imaging Studies ordered: I ordered imaging studies including CT head I independently visualized and interpreted imaging. I agree with the radiologist interpretation   Medicines ordered and prescription drug management: No orders of the defined types were placed in this encounter.   -I have reviewed the patients home medicines and have made adjustments as needed  Critical interventions Management of retinal detachment.  Cardiac Monitoring: The patient was maintained on a cardiac monitor.  I personally viewed and interpreted the cardiac monitored which showed an underlying rhythm of: Normal sinus rhythm  Social Determinants of Health:  Factors impacting patients care include: Medical comorbidities including history of prior CVAs   Reevaluation: After the interventions noted above, I reevaluated  the patient and found that they have :improved  Co morbidities that complicate the  patient evaluation  Past Medical History:  Diagnosis Date   Anemia    severe   Anxiety    Asthma    CKD (chronic kidney disease)    stage IV   DDD (degenerative disc disease), cervical    Depression    Diabetes mellitus (HCC)    Dislocated shoulder    right   GERD (gastroesophageal reflux disease)    Headache    migraines (about once a month)   History of kidney stones    Hypertension    Neuropathy    Peripheral vascular disease    Pneumonia    Stroke [facility])        Final Clinical Impression(s) / ED Diagnoses Final diagnoses:  Partial retinal detachment of right eye     @PCDICTATION @    Mannie Pac T, DO 03/11/24 2305

## 2024-03-12 DIAGNOSIS — Z743 Need for continuous supervision: Secondary | ICD-10-CM | POA: Diagnosis not present

## 2024-03-12 DIAGNOSIS — I10 Essential (primary) hypertension: Secondary | ICD-10-CM | POA: Diagnosis not present

## 2024-03-12 DIAGNOSIS — H3320 Serous retinal detachment, unspecified eye: Secondary | ICD-10-CM | POA: Diagnosis not present

## 2024-03-12 NOTE — ED Notes (Signed)
 [facility] at bedside

## 2024-04-02 ENCOUNTER — Telehealth: Payer: Self-pay | Admitting: *Deleted

## 2024-04-02 NOTE — Telephone Encounter (Signed)
 Received a call from [facility] OT requesting 1wk7. called to approv but no name on VM so could not leave patient information and approval. Await call back.

## 2024-04-21 ENCOUNTER — Telehealth: Payer: Self-pay | Admitting: *Deleted

## 2024-04-21 NOTE — Telephone Encounter (Signed)
 Leita fro [facility] PT is requesting a referral be sent to [facility] clinic for an eval for orthotic for gait assistance.

## 2024-04-25 NOTE — Telephone Encounter (Signed)
 Leita notified Ms Abril will need to make appt.

## 2024-05-12 ENCOUNTER — Encounter: Payer: Self-pay | Admitting: Psychology

## 2024-05-12 ENCOUNTER — Encounter: Attending: Physical Medicine & Rehabilitation | Admitting: Psychology

## 2024-05-12 ENCOUNTER — Other Ambulatory Visit: Payer: Self-pay | Admitting: Family

## 2024-05-12 DIAGNOSIS — R269 Unspecified abnormalities of gait and mobility: Secondary | ICD-10-CM | POA: Diagnosis present

## 2024-05-12 DIAGNOSIS — G8191 Hemiplegia, unspecified affecting right dominant side: Secondary | ICD-10-CM | POA: Diagnosis not present

## 2024-05-12 DIAGNOSIS — I69398 Other sequelae of cerebral infarction: Secondary | ICD-10-CM

## 2024-05-12 DIAGNOSIS — F482 Pseudobulbar affect: Secondary | ICD-10-CM

## 2024-05-12 DIAGNOSIS — R531 Weakness: Secondary | ICD-10-CM | POA: Diagnosis present

## 2024-05-12 DIAGNOSIS — I119 Hypertensive heart disease without heart failure: Secondary | ICD-10-CM

## 2024-05-12 NOTE — Progress Notes (Signed)
 Neuropsychological Consultation   Patient:   Christina Orozco   DOB:   March 21, 1971  MR Number:  [id number]  Location:  [facility] PHYSICAL MEDICINE AND REHABILITATION [address] [place], [address] [place] [address] Dept: [phone]           Date of Service:   05/12/2024  Location of Service and Individuals present: Today's visit was conducted in my outpatient clinic office with the patient and myself present.  Start Time:   3 PM End Time:   4 PM  Today's visit was conducted in my outpatient clinic office and was 1 hour in duration.   Provider/Observer:  Norleen Asa, Psy.D.       Clinical Neuropsychologist       Billing Code/Service: [phone]  Chief Complaint:     Chief Complaint  Patient presents with   Depression   Gait Problem   Cerebrovascular Accident   Stress    Reason for Service:    Christina Orozco is a 53 year old female with a history of multiple cerebrovascular accidents, referred for neuropsychological/psychological therapeutic interventions. She was seen in the inpatient comprehensive rehabilitation program on 08/30/2022. Past medical history includes significant hypertension, hyperlipidemia, controlled type 2 diabetes, prior history of multiple strokes/TIA, depression, obstructive sleep apnea, and a seizure in 2019. The most recent CVA occurred on 09/29/2023, presenting as worsening right-sided weakness. MRI brain identified a 4 mm acute ischemic left pontine infarct, with underlying advanced chronic microvascular ischemic disease, multiple remote lacunar infarcts, and chronic microhemorrhages consistent with poorly controlled hypertension. She has since been diagnosed with stage V chronic kidney disease and was informed she will require dialysis. This news was received the same day her mother was hospitalized and given a poor prognosis, which caused significant distress. She is currently seeing a  nephrologist.   Medical History:   Past Medical History:  Diagnosis Date   Anemia    severe   Anxiety    Asthma    CKD (chronic kidney disease)    stage IV   DDD (degenerative disc disease), cervical    Depression    Diabetes mellitus (HCC)    Dislocated shoulder    right   GERD (gastroesophageal reflux disease)    Headache    migraines (about once a month)   History of kidney stones    Hypertension    Neuropathy    Peripheral vascular disease    Pneumonia    Stroke [facility])          Patient Active Problem List   Diagnosis Date Noted   Left pontine stroke (HCC) 09/21/2023   Fecal occult blood test positive 04/23/2023   Stroke (cerebrum) (HCC) 04/17/2023   Acute ischemic stroke (HCC) 04/14/2023   Prolonged QT interval 04/14/2023   Diplopia 10/31/2022   OSA (obstructive sleep apnea) 08/25/2022   Subcortical infarction (HCC) 08/23/2022   Overweight (BMI 25.0-29.9) 08/23/2022   Asthma, chronic 08/22/2022   TIA (transient ischemic attack) 06/20/2022   Numbness and tingling in left hand 03/02/2022   Gait disturbance, post-stroke 03/02/2022   AKI (acute kidney injury) 12/23/2021   Acute embolic stroke (HCC) 12/23/2021   Hypernatremia 12/20/2021   CKD (chronic kidney disease), stage IV (HCC) 12/19/2021   Left-sided weakness and visual deficits  12/19/2021   Vision abnormalities 12/19/2021   Acute stroke due to ischemia [facility]) 11/10/2021   Depression 07/05/2020   CVA (cerebral vascular accident) (HCC) 06/21/2020   Generalized anxiety disorder 03/01/2019  Abnormality of gait 10/14/2018   Dizziness and giddiness 09/13/2018   Generalized weakness 06/26/2018   Chronic nonintractable headache 04/26/2018   Diastolic dysfunction    Congestion of nasal sinus    Pneumonia of left lower lobe due to infectious organism    SOB (shortness of breath)    Candidiasis    History of anemia due to chronic kidney disease    Acute on chronic anemia    Hypoalbuminemia due to  protein-calorie malnutrition    Hypertension associated with diabetes (HCC)    Type 2 diabetes mellitus with chronic kidney disease, without long-term current use of insulin  (HCC)    PRES (posterior reversible encephalopathy syndrome)    Hypokalemia    History of CVA with residual deficit    History of seizure 03/25/2018   Bilateral carpal tunnel syndrome 02/14/2018   Arthritis 10/31/2017   Asthma 10/31/2017   Constipation 10/31/2017   Kidney stone 10/31/2017   Anemia, chronic disease 08/09/2017   Hyperlipidemia associated with type 2 diabetes mellitus (HCC) 07/26/2017   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HCC) 07/25/2017   Anxiety as acute reaction to exceptional stress 04/23/2017   Insomnia 10/08/2016   Neuropathy 06/07/2016   Gait instability    Lesion of pons    Slurred speech    Vertigo 04/30/2016   Hyperglycemia 04/30/2016   Chronic kidney disease (CKD), stage III (moderate) (HCC) 04/30/2016   Ataxia 04/30/2016   Chronic right shoulder pain 04/03/2016   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  Most recent MRI performed on 10/28/2022 indicated chronic severe signal abnormality in the brainstem and cerebellar peduncles, multifocal chronic bilateral deep gray nuclei lacunar infarcts.  An extensive cerebral white matter T2 and FLAIR hyperintensities including some areas of corpus callosum.  Encephalomalacia no and lesions oriented perpendicular to lateral ventricles are also noted.  A moderate number of chronic microhemorrhages are scattered in the bilateral deep gray nuclei especially throughout the brainstem.  Behavioral Observation/Mental Status:   Christina Orozco  presents as a 53  y.o.-year-old Right handed African American Female who appeared her stated age. her dress was Appropriate and she was Well Groomed and her manners were Appropriate to the situation.  her participation was indicative of Appropriate behaviors.  There were physical disabilities noted.  she displayed an appropriate level of cooperation and motivation.    Interactions:    Active Appropriate  Attention:   abnormal and attention span appeared shorter than expected for age  Memory:   within normal limits; recent and remote memory intact  Visuo-spatial:   not examined  Speech (Volume):  normal  Speech:   normal; slurred  Thought Process:  Coherent and Relevant  Coherent and Logical  Though Content:  WNL; not suicidal and not homicidal  Orientation:   person, place, time/date, and situation  Judgment:   Fair  Planning:   Fair  Affect:    Anxious  Mood:    Anxious  Insight:   Good  Intelligence:   normal  Marital Status/Living:  The patient was born and raised in [place]  along with 1 sister and 2 brothers.  The patient continues to live with her youngest daughter.  Patient is single and is never married.  The patient has  3 adult children age 74, 65.  Educational and Occupational History:     Highest Level of Education:  Patient graduated from high school and has taken some college courses at [employer].  Her best subject was math and had some relative weaknesses with regard to reading.  She did repeat the second grade.  Current Occupation:    The patient is disabled due to her cerebrovascular events.  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  Psychiatric History:  The patient has a history of anxiety and acute reaction to significant psychosocial stressors in the past and history of some mild depression but her emotional status has become quite variable more recently with patterns consistent with pseudobulbar affect  type symptoms.  History of Substance Use or Abuse:  No concerns of substance abuse are reported.    Family Med/Psych History:  Family History  Problem Relation Age of Onset   Vascular Disease Mother    CAD Mother    Heart failure Mother    Heart disease Other    Cancer Other        colon, stomach, pancreatic, lung   Diabetes Other    Breast cancer Sister 30   Seizures Maternal Uncle    Current Psychosocial Factors:       Major stressors include the recent diagnosis of end-stage renal disease and the need for dialysis, her mother's critical hospitalization with a DNR in place, and the news of her youngest daughter's pregnancy. The patient is feeling overwhelmed and reports difficulty processing these concurrent life events. She has a history of her mother's paranoid ideation causing distress during a previous visit.  Content of Session:                          The session focused on providing psychoeducation regarding her new diagnosis of stage V chronic kidney disease and the upcoming need for dialysis. The function of the kidneys, the reasons for their failure related to vascular issues, and the mechanism of dialysis were explained. A detailed discussion was held regarding the two options for dialysis presented by her nephrologist: in-center hemodialysis versus home dialysis. The pros and cons of each modality were reviewed, including frequency, duration, convenience, and the need for patient diligence with the home option. The possibility of kidney transplantation was also discussed, including barriers such as ineligibility for the transplant list due to her history of recurrent strokes, and the surgical risks associated with her need for anticoagulation, even if a living donor (e.g., her daughter) were available. Future potential treatments like xenotransplantation were mentioned. The patient was encouraged to be diligent with follow-up with her nephrologist and to prepare questions for her  upcoming appointment in February. The patient's recent medication changes, specifically the discontinuation and recent restart of sodium bicarbonate , were noted. She reports a good mood currently.  Effectiveness of Interventions:       The patient engaged well with the psychoeducation provided. She asked relevant questions and appeared to gain a clearer understanding of her kidney disease and treatment options. The discussion seemed to help contextualize the information given by her nephrologist, which she initially found difficult to process due to concurrent family stressors.  Target Goals:                                     Goals  include continued coping and adjustment to chronic illness, particularly the new diagnosis of stage V CKD and impending dialysis. A secondary goal is to manage the psychological impact of recurrent CVAs. The patient will make an informed decision about her dialysis modality. The plan is to schedule three follow-up appointments to provide ongoing support.    Goals Last Reviewed:                       03/31/2024    Goals Addressed Today:                 The session addressed the patient's acute distress and confusion regarding her recent diagnosis of stage V CKD and the need for dialysis. Psychoeducation was provided to improve her understanding of the condition and treatment options. Coping strategies for managing multiple significant life stressors were implicitly supported through a detailed and clarifying discussion of her medical situation. She reports her mood is currently fine.   Impression/DX:   Naliah Eddington is a 53 year old female referred for neuropsychological/psychological therapeutic interventions with a history of multiple previous cerebrovascular accidents.  The patient was recently seen in the inpatient comprehensive rehabilitation program and I saw the patient while she was admitted on 08/30/2022.  Patient has a past medical history that includes  significant hypertension, hyperlipidemia, controlled type 2 diabetes and prior history of multiple strokes/TIA.  Patient also has a history of depression and has taken Cymbalta  in the past with no current psychotropic medications.  Patient has had previous diagnoses including obstructive sleep apnea and was on CPAP prior.  Patient had a more recent sleep study done that was required to get a new CPAP machine and at that point the sleep study was not definitive.  The sleep apnea machine was not reviewed/renewed although I think in this particular case we should push to restarting that.  The most recent CVA occurred on 08/22/2022 resulting in left-sided weakness, dizziness and aphasia.  MRI brain at the time identified punctuate foci of acute/early subacute ischemia in the lateral margins of the left basal ganglia.    Diagnosis:    PBA (pseudobulbar affect)  Gait disturbance, post-stroke  Left-sided weakness and visual deficits   Right hemiparesis (HCC)        Note: This document was prepared using Dragon voice recognition software and may include unintentional dictation errors.   Electronically Signed   _______________________ Norleen Asa, Psy.D. Clinical Neuropsychologist

## 2024-05-14 ENCOUNTER — Emergency Department (HOSPITAL_COMMUNITY)

## 2024-05-14 ENCOUNTER — Observation Stay (HOSPITAL_COMMUNITY): Admission: EM | Admit: 2024-05-14 | Discharge: 2024-05-15 | Disposition: A

## 2024-05-14 ENCOUNTER — Other Ambulatory Visit: Payer: Self-pay

## 2024-05-14 DIAGNOSIS — N184 Chronic kidney disease, stage 4 (severe): Secondary | ICD-10-CM | POA: Diagnosis not present

## 2024-05-14 DIAGNOSIS — Z7982 Long term (current) use of aspirin: Secondary | ICD-10-CM | POA: Diagnosis not present

## 2024-05-14 DIAGNOSIS — R4781 Slurred speech: Secondary | ICD-10-CM | POA: Diagnosis not present

## 2024-05-14 DIAGNOSIS — I2510 Atherosclerotic heart disease of native coronary artery without angina pectoris: Secondary | ICD-10-CM | POA: Insufficient documentation

## 2024-05-14 DIAGNOSIS — Z79899 Other long term (current) drug therapy: Secondary | ICD-10-CM | POA: Insufficient documentation

## 2024-05-14 DIAGNOSIS — I129 Hypertensive chronic kidney disease with stage 1 through stage 4 chronic kidney disease, or unspecified chronic kidney disease: Secondary | ICD-10-CM | POA: Insufficient documentation

## 2024-05-14 DIAGNOSIS — G4733 Obstructive sleep apnea (adult) (pediatric): Secondary | ICD-10-CM | POA: Diagnosis not present

## 2024-05-14 DIAGNOSIS — D649 Anemia, unspecified: Secondary | ICD-10-CM | POA: Diagnosis not present

## 2024-05-14 DIAGNOSIS — J4520 Mild intermittent asthma, uncomplicated: Secondary | ICD-10-CM | POA: Insufficient documentation

## 2024-05-14 DIAGNOSIS — I6389 Other cerebral infarction: Principal | ICD-10-CM | POA: Insufficient documentation

## 2024-05-14 DIAGNOSIS — K219 Gastro-esophageal reflux disease without esophagitis: Secondary | ICD-10-CM | POA: Diagnosis not present

## 2024-05-14 DIAGNOSIS — I639 Cerebral infarction, unspecified: Principal | ICD-10-CM | POA: Diagnosis present

## 2024-05-14 DIAGNOSIS — Z789 Other specified health status: Secondary | ICD-10-CM

## 2024-05-14 DIAGNOSIS — R531 Weakness: Secondary | ICD-10-CM | POA: Diagnosis present

## 2024-05-14 LAB — DIFFERENTIAL
Abs Immature Granulocytes: 0.02 K/uL (ref 0.00–0.07)
Basophils Absolute: 0 K/uL (ref 0.0–0.1)
Basophils Relative: 1 %
Eosinophils Absolute: 0.1 K/uL (ref 0.0–0.5)
Eosinophils Relative: 2 %
Immature Granulocytes: 0 %
Lymphocytes Relative: 32 %
Lymphs Abs: 1.5 K/uL (ref 0.7–4.0)
Monocytes Absolute: 0.3 K/uL (ref 0.1–1.0)
Monocytes Relative: 7 %
Neutro Abs: 2.7 K/uL (ref 1.7–7.7)
Neutrophils Relative %: 58 %

## 2024-05-14 LAB — MAGNESIUM: Magnesium: 2.3 mg/dL (ref 1.7–2.4)

## 2024-05-14 LAB — COMPREHENSIVE METABOLIC PANEL WITH GFR
ALT: 11 U/L (ref 0–44)
AST: 15 U/L (ref 15–41)
Albumin: 3.5 g/dL (ref 3.5–5.0)
Alkaline Phosphatase: 45 U/L (ref 38–126)
Anion gap: 4 — ABNORMAL LOW (ref 5–15)
BUN: 39 mg/dL — ABNORMAL HIGH (ref 6–20)
CO2: 26 mmol/L (ref 22–32)
Calcium: 8.8 mg/dL — ABNORMAL LOW (ref 8.9–10.3)
Chloride: 111 mmol/L (ref 98–111)
Creatinine, Ser: 3.4 mg/dL — ABNORMAL HIGH (ref 0.44–1.00)
GFR, Estimated: 16 mL/min — ABNORMAL LOW (ref >60)
Glucose, Bld: 120 mg/dL — ABNORMAL HIGH (ref 70–99)
Potassium: 3.8 mmol/L (ref 3.5–5.1)
Sodium: 141 mmol/L (ref 135–145)
Total Bilirubin: 0.4 mg/dL (ref 0.0–1.2)
Total Protein: 6.7 g/dL (ref 6.5–8.1)

## 2024-05-14 LAB — CBC
HCT: 27.2 % — ABNORMAL LOW (ref 36.0–46.0)
Hemoglobin: 8.7 g/dL — ABNORMAL LOW (ref 12.0–15.0)
MCH: 29.7 pg (ref 26.0–34.0)
MCHC: 32 g/dL (ref 30.0–36.0)
MCV: 92.8 fL (ref 80.0–100.0)
Platelets: 194 K/uL (ref 150–400)
RBC: 2.93 MIL/uL — ABNORMAL LOW (ref 3.87–5.11)
RDW: 14.1 % (ref 11.5–15.5)
WBC: 4.6 K/uL (ref 4.0–10.5)
nRBC: 0 % (ref 0.0–0.2)

## 2024-05-14 LAB — APTT: aPTT: 29 s (ref 24–36)

## 2024-05-14 LAB — ETHANOL: Alcohol, Ethyl (B): <15 mg/dL (ref <15)

## 2024-05-14 LAB — CBG MONITORING, ED: Glucose-Capillary: 109 mg/dL — ABNORMAL HIGH (ref 70–99)

## 2024-05-14 LAB — PROTIME-INR
INR: 1.1 (ref 0.8–1.2)
Prothrombin Time: 14.6 s (ref 11.4–15.2)

## 2024-05-14 MED ORDER — SODIUM BICARBONATE 650 MG PO TABS
650.0000 mg | ORAL_TABLET | Freq: Three times a day (TID) | ORAL | Status: DC
  Administered 2024-05-14 – 2024-05-15 (×3): 650 mg via ORAL
  Filled 2024-05-14 (×3): qty 1

## 2024-05-14 MED ORDER — FLUOXETINE HCL 20 MG PO CAPS
20.0000 mg | ORAL_CAPSULE | Freq: Every day | ORAL | Status: DC
  Administered 2024-05-15: 20 mg via ORAL
  Filled 2024-05-14: qty 1

## 2024-05-14 MED ORDER — SODIUM CHLORIDE 0.9 % IV BOLUS
1000.0000 mL | Freq: Once | INTRAVENOUS | Status: AC
  Administered 2024-05-14: 1000 mL via INTRAVENOUS

## 2024-05-14 MED ORDER — ALBUTEROL SULFATE (2.5 MG/3ML) 0.083% IN NEBU: 2.5000 mg | INHALATION_SOLUTION | Freq: Four times a day (QID) | RESPIRATORY_TRACT | Status: DC | PRN

## 2024-05-14 MED ORDER — SENNOSIDES-DOCUSATE SODIUM 8.6-50 MG PO TABS
1.0000 | ORAL_TABLET | Freq: Two times a day (BID) | ORAL | Status: DC
  Administered 2024-05-14 – 2024-05-15 (×2): 1 via ORAL
  Filled 2024-05-14 (×2): qty 1

## 2024-05-14 MED ORDER — PANTOPRAZOLE SODIUM 40 MG PO TBEC
40.0000 mg | DELAYED_RELEASE_TABLET | Freq: Two times a day (BID) | ORAL | Status: DC
  Administered 2024-05-15: 40 mg via ORAL
  Filled 2024-05-14: qty 1

## 2024-05-14 MED ORDER — ASPIRIN 81 MG PO TBEC
81.0000 mg | DELAYED_RELEASE_TABLET | Freq: Every day | ORAL | Status: DC
  Administered 2024-05-15: 81 mg via ORAL
  Filled 2024-05-14: qty 1

## 2024-05-14 MED ORDER — MELATONIN 5 MG PO TABS: 5.0000 mg | ORAL_TABLET | Freq: Every evening | ORAL | Status: DC | PRN

## 2024-05-14 MED ORDER — ATORVASTATIN CALCIUM 80 MG PO TABS
80.0000 mg | ORAL_TABLET | Freq: Every day | ORAL | Status: DC
  Filled 2024-05-14: qty 2

## 2024-05-14 MED ORDER — MONTELUKAST SODIUM 10 MG PO TABS
10.0000 mg | ORAL_TABLET | Freq: Every day | ORAL | Status: DC
  Administered 2024-05-14: 10 mg via ORAL
  Filled 2024-05-14: qty 1

## 2024-05-14 MED ORDER — TICAGRELOR 90 MG PO TABS
90.0000 mg | ORAL_TABLET | Freq: Two times a day (BID) | ORAL | Status: DC
  Administered 2024-05-14 – 2024-05-15 (×2): 90 mg via ORAL
  Filled 2024-05-14 (×2): qty 1

## 2024-05-14 MED ORDER — ENOXAPARIN SODIUM 30 MG/0.3ML IJ SOSY
30.0000 mg | PREFILLED_SYRINGE | INTRAMUSCULAR | Status: DC
  Administered 2024-05-14: 30 mg via SUBCUTANEOUS
  Filled 2024-05-14: qty 0.3

## 2024-05-14 MED ORDER — NAPHAZOLINE-GLYCERIN 0.012-0.25 % OP SOLN: 1.0000 [drp] | Freq: Two times a day (BID) | OPHTHALMIC | Status: DC

## 2024-05-14 MED ORDER — METHOCARBAMOL 500 MG PO TABS: 500.0000 mg | ORAL_TABLET | Freq: Three times a day (TID) | ORAL | Status: DC | PRN

## 2024-05-14 MED ORDER — FLUTICASONE PROPIONATE 50 MCG/ACT NA SUSP: 2.0000 | Freq: Every day | NASAL | Status: DC | PRN

## 2024-05-14 NOTE — Assessment & Plan Note (Addendum)
 OSA: CPAP at bedtime Mild intermittent asthma - albuterol  PRN, singulair  10mg  qHS GAD - continue home fluoxetine  20mg  daily GERD - continue home protonix 

## 2024-05-14 NOTE — Progress Notes (Signed)
   05/14/24 2352  BiPAP/CPAP/SIPAP  BiPAP/CPAP/SIPAP Pt Type Adult  BiPAP/CPAP/SIPAP DREAMSTATIOND  Mask Type Nasal mask  Mask Size Small  EPAP 4 cmH2O  Patient Home Machine No  Patient Home Mask No  Patient Home Tubing No  Auto Titrate No  BiPAP/CPAP /SiPAP Vitals  Pulse Rate 76  SpO2 100 %  Bilateral Breath Sounds Clear  MEWS Score/Color  MEWS Score 0  MEWS Score Color Landy

## 2024-05-14 NOTE — ED Notes (Signed)
 Patient transported to CT

## 2024-05-14 NOTE — ED Provider Notes (Signed)
 [place] EMERGENCY DEPARTMENT AT [facility] Provider Note   CSN: [id number] Arrival date & time: 05/14/24  1456     Patient presents with: Weakness   Christina Orozco is a 53 y.o. female.   53 yo F with a cc of R sided weakness. Patient woke up this morning with this.  Has weakness at baseline but worse today than has been.  Seen by her PT this afternoon with concern and sent here for evaluation.  Patient has had some improvement.  Denies head or neck injury.  Had diarrhea last night.  Otherwise feels like she has been doing ok.    Weakness      Prior to Admission medications   Medication Sig Start Date End Date Taking? Authorizing Provider  albuterol  (PROAIR  HFA) 108 (90 Base) MCG/ACT inhaler INHALE 1 OR 2 PUFFS INTO THE LUNGS EVERY 6 HOURS AS NEEDED FOR WHEEZING OR SHORTNESS OF BREATH 04/12/18   Angiulli, Toribio PARAS, PA-C  amLODipine  (NORVASC ) 10 MG tablet TAKE 1 TABLET (10 MG TOTAL) BY MOUTH DAILY. (AM) Patient taking differently: Take 10 mg by mouth in the morning. 03/05/23   Vannie Reche RAMAN, NP  aspirin  EC 81 MG tablet Take 1 tablet (81 mg total) by mouth 2 (two) times daily. Swallow whole. 09/29/23   Jillian Buttery, MD  atorvastatin  (LIPITOR ) 80 MG tablet Take 1 tablet (80 mg total) by mouth at bedtime. 10/05/22   Walker, Caitlin S, NP  carvedilol  (COREG ) 12.5 MG tablet Take 1 tablet (12.5 mg total) by mouth 2 (two) times daily with a meal. 08/28/23   Walker, Caitlin S, NP  diazepam  (VALIUM ) 2 MG tablet Take 1 tablet (2 mg total) by mouth every 12 (twelve) hours as needed for up to 12 doses (vertigo). 11/23/23   Cottie Donnice PARAS, MD  FLUoxetine  (PROZAC ) 20 MG capsule Take 20 mg by mouth daily.    [provider]  fluticasone  (FLONASE ) 50 MCG/ACT nasal spray Place 2 sprays into both nostrils daily as needed for allergies or rhinitis. 04/27/23   Love, Sharlet RAMAN, PA-C  meclizine  (ANTIVERT ) 25 MG tablet Take 1 tablet (25 mg total) by mouth 3 (three) times daily as  needed for up to 30 doses for dizziness. 11/23/23   Cottie Donnice PARAS, MD  melatonin 5 MG TABS Take 1 tablet (5 mg total) by mouth at bedtime as needed. 04/27/23   Love, Sharlet RAMAN, PA-C  methocarbamol  (ROBAXIN ) 500 MG tablet Take 1 tablet (500 mg total) by mouth every 8 (eight) hours as needed. 12/15/23   Patsey Lot, MD  montelukast  (SINGULAIR ) 10 MG tablet Take 10 mg by mouth at bedtime.    [provider]  naphazoline-glycerin  (CLEAR EYES REDNESS) 0.012-0.25 % SOLN Place 1-2 drops into the left eye 2 (two) times daily. 04/24/23   Love, Sharlet RAMAN, PA-C  ondansetron  (ZOFRAN -ODT) 4 MG disintegrating tablet Take 1 tablet (4 mg total) by mouth every 8 (eight) hours as needed for up to 15 doses for nausea or vomiting. 11/23/23   Cottie Donnice PARAS, MD  OZEMPIC , 0.25 OR 0.5 MG/DOSE, 2 MG/3ML SOPN Inject 0.5 mg into the skin See admin instructions. Inject 0.5 mg subcutaneous every 10 days 04/14/22   [provider]  pantoprazole  (PROTONIX ) 40 MG tablet Take 1 tablet (40 mg total) by mouth 2 (two) times daily. 04/27/23   Love, Sharlet RAMAN, PA-C  senna-docusate (SENOKOT-S) 8.6-50 MG tablet Take 1 tablet by mouth 2 (two) times daily. 09/29/23   Jillian Buttery, MD  sodium bicarbonate  650 MG tablet Take 1 tablet (650 mg total) by mouth 3 (three) times daily. 09/29/23   Jillian Buttery, MD  ticagrelor  (BRILINTA ) 90 MG TABS tablet Take 1 tablet (90 mg total) by mouth 2 (two) times daily. 09/29/23   Jillian Buttery, MD    Allergies: Lisinopril , Penicillins, Quetiapine, and Gabapentin     Review of Systems  Neurological:  Positive for weakness.    Updated Vital Signs BP (!) 150/80 (BP Location: Right Arm)   Pulse 82   Temp 97.8 F (36.6 C) (Oral)   Resp 18   Ht 5' 3 (1.6 m)   Wt 65.3 kg   LMP 05/22/2018 (Within Days)   SpO2 100%   BMI 25.51 kg/m   Physical Exam Vitals and nursing note reviewed.  Constitutional:      General: She is not in acute distress.    Appearance: She is  well-developed. She is not diaphoretic.  HENT:     Head: Normocephalic and atraumatic.  Eyes:     Pupils: Pupils are equal, round, and reactive to light.  Cardiovascular:     Rate and Rhythm: Normal rate and regular rhythm.     Heart sounds: No murmur heard.    No friction rub. No gallop.  Pulmonary:     Effort: Pulmonary effort is normal.     Breath sounds: No wheezing or rales.  Abdominal:     General: There is no distension.     Palpations: Abdomen is soft.     Tenderness: There is no abdominal tenderness.  Musculoskeletal:        General: No tenderness.     Cervical back: Normal range of motion and neck supple.  Skin:    General: Skin is warm and dry.  Neurological:     Mental Status: She is alert and oriented to person, place, and time.     Comments: Right upper extremity weakness compared to left. No drift.   Psychiatric:        Behavior: Behavior normal.     (all labs ordered are listed, but only abnormal results are displayed) Labs Reviewed  CBC - Abnormal; Notable for the following components:      Result Value   RBC 2.93 (*)    Hemoglobin 8.7 (*)    HCT 27.2 (*)    All other components within normal limits  COMPREHENSIVE METABOLIC PANEL WITH GFR - Abnormal; Notable for the following components:   Glucose, Bld 120 (*)    BUN 39 (*)    Creatinine, Ser 3.40 (*)    Calcium  8.8 (*)    GFR, Estimated 16 (*)    Anion gap 4 (*)    All other components within normal limits  CBG MONITORING, ED - Abnormal; Notable for the following components:   Glucose-Capillary 109 (*)    All other components within normal limits  PROTIME-INR  APTT  DIFFERENTIAL  ETHANOL  MAGNESIUM   RAPID URINE DRUG SCREEN, HOSP PERFORMED    EKG: EKG Interpretation Date/Time:  Wednesday May 14 2024 16:23:01 EST Ventricular Rate:  75 PR Interval:  152 QRS Duration:  91 QT Interval:  475 QTC Calculation: 531 R Axis:   75  Text Interpretation: Sinus rhythm Probable left atrial  enlargement Probable anteroseptal infarct, old Prolonged QT interval No significant change since last tracing Confirmed by Emil Share [phone]) on 05/14/2024 6:45:22 PM  Radiology: MR BRAIN WO CONTRAST Result Date: 05/14/2024 EXAM: MRI BRAIN WITHOUT CONTRAST 05/14/2024 06:01:34 PM TECHNIQUE: Multiplanar multisequence MRI  of the head/brain was performed without the administration of intravenous contrast. COMPARISON: CT from earlier the same day as well as previous MRI from 11/23/2023. CLINICAL HISTORY: Neuro deficit, acute, stroke suspected. FINDINGS: BRAIN AND VENTRICLES: Subcentimeter focus of diffusion signal abnormality seen involving the subcortical white matter of the left frontal centrum semiovale, suspicious for small acute to subacute infarction (series 2, image 36). No associated acute hemorrhage or significant mass effect. Patchy and confluent T2 and FLAIR hyperintensity involving the periventricular and subcortical hemispheres as well as the pons, consistent with chronic small vessel ischemic disease, advanced for age. Associated with volume loss about the middle cerebellar peduncles. Multiple superimposed remote lacunar infarcts about the deep gray nuclei and pons. Multiple chronic microhemorrhages noted, most pronounced about the deep gray nuclei and brainstem, most characteristic of chronic poorly controlled hypertension. No midline shift. No hydrocephalus. The sella is unremarkable. Normal flow voids. ORBITS: No acute abnormality. SINUSES AND MASTOIDS: No acute abnormality. BONES AND SOFT TISSUES: Normal marrow signal. A small lipoma is noted at the right frontal scalp. There is prior bilateral acromioclavicular joint placement. No acute soft tissue injury. IMPRESSION: 1. Few subcentimeter acute to early subacute small vessel type infarcts involving the left frontal centrum semiovale. No associated hemorrhage or mass effect. 2. Underlying advanced chronic microvascular ischemic disease. 3. Multiple  chronic microhemorrhages as above, most likely related to chronic poorly controlled hypertension. Electronically signed by: Morene Hoard MD 05/14/2024 07:11 PM EST RP Workstation: [id number]   CT HEAD WO CONTRAST Result Date: 05/14/2024 EXAM: CT HEAD WITHOUT CONTRAST 05/14/2024 04:06:34 PM TECHNIQUE: CT of the head was performed without the administration of intravenous contrast. Automated exposure control, iterative reconstruction, and/or weight based adjustment of the mA/kV was utilized to reduce the radiation dose to as low as reasonably achievable. COMPARISON: CT head 03/11/24 CLINICAL HISTORY: Neuro deficit, acute, stroke suspected FINDINGS: Motion limited study. Within this limitations. BRAIN AND VENTRICLES: No acute hemorrhage. No evidence of acute infarct. No hydrocephalus. No extra-axial collection. No mass effect or midline shift. Remote bilateral thalamic and left basal ganglia lacunar infarcts. Patchy white matter hypodensities are compatible with chronic microvascular ischemic disease. ORBITS: No acute abnormality. SINUSES: No acute abnormality. SOFT TISSUES AND SKULL: No acute soft tissue abnormality. No skull fracture. IMPRESSION: 1. No acute intracranial abnormality. Electronically signed by: Gilmore Molt MD 05/14/2024 04:13 PM EST RP Workstation: [id number]     Procedures   Medications Ordered in the ED  sodium chloride  0.9 % bolus 1,000 mL (0 mLs Intravenous Stopped 05/14/24 1853)                                    Medical Decision Making Amount and/or Complexity of Data Reviewed Labs: ordered. Radiology: ordered.   53 yo F with a chief complaints of right sided weakness.  She woke up with it this morning.  It sounds like her physical therapist who came today also thought that maybe she had some left-sided facial droop.  It sounds like it has improved over time today.  She is Fleeta negative.  She has had multiple strokes in the past year or so on my record review.  Will  obtain CT of the head.  MRI.  Blood work.  Renal function appears to be at baseline.  No significant electrolyte abnormalities.  CT of the head without obvious acute intracranial pathology.  MRI is resulted and is concerning for some punctate acute strokes.  Discussed with neurology.  Dr. Lindzen.  Recommends medical admission.  Discussed with medicine for admission.  The patients results and plan were reviewed and discussed.   Any x-rays performed were independently reviewed by myself.   Differential diagnosis were considered with the presenting HPI.  Medications  sodium chloride  0.9 % bolus 1,000 mL (0 mLs Intravenous Stopped 05/14/24 1853)    Vitals:   05/14/24 1700 05/14/24 1715 05/14/24 1900 05/14/24 1912  BP: 139/71 (!) 155/85 (!) 150/80 (!) 150/80  Pulse: 79 80 79 82  Resp: 17 18  18   Temp:    97.8 F (36.6 C)  TempSrc:    Oral  SpO2: 100% 100% 100% 100%  Weight:      Height:        Final diagnoses:  Acute ischemic stroke [facility])         Final diagnoses:  Acute ischemic stroke [facility])    ED Discharge Orders     None          Emil Share, DO 05/14/24 1956

## 2024-05-14 NOTE — ED Notes (Signed)
 Patient requesting CPAP; RT called to set up one for patient

## 2024-05-14 NOTE — Assessment & Plan Note (Signed)
 Nearing HD (patient deciding HD or PD by Feb 2026). GFR stable here at 16. -Continue to monitor GFR here -Avoid nephrotoxic medications -Continue home sodium bicarb 650mg  TID

## 2024-05-14 NOTE — H&P (Cosign Needed Addendum)
 Hospital Admission History and Physical Service Pager: [phone]  Patient name: Christina Orozco Medical record number: [id number] Date of Birth: 01-01-1971 Age: 53 y.o. Gender: female  Primary Care Provider: Tinnie Forts, PA-C Consultants: Neuro Code Status: FULL Preferred Emergency Contact:  Contact Information     Name Relation Home Work Mobile   Robicheaux,[name] Brother [phone]        Other Contacts     Name Relation Home Work Mobile   Blalock,Mahogany Niece   [phone]      Chief Complaint: weakness  Differential and Medical Decision Making:  Christina Orozco is a 53 y.o. female presenting with weakness, slurred speech, visual problems.  Differential for this patient's presentation of this includes stroke (significant hx, motor weakness and facial droop w/ vision change, evidence of same on imaging), TIA (same as above, symptoms resolved, though imaging findings consistent with stroke), intracranial mass (no evidence of mass on imaging, no HA), and intoxication/substance use (ethanol WNL, UDS pending, subjective report of no substance use, no clinical evidence of intoxication).   For her multiple strokes, she has been following outpatient with neurology and making lifestyle adjustments. Recent labs notable for LDL and A1c at goal in Oct 2025. Prior echo and carotid imaging unrevealing for repeat stroke etiology. Defer repeat testing at this time unless desired by neurology. Hypercoagulability workup also pursued in April 2025 which revealed elevated homocysteine 22.5 and decreased protein S function, but otherwise unrevealing with lupus anticoagulant, anticardiolipin, protein C, TSH, ANA, dsDNA Ab, P and C ANCA, SSA and SSB antibody tests all WNL. Consider initiation of DOAC given multiple strokes on long-term DAPT with other risk factors well-controlled Assessment & Plan Stroke [facility]) Patient w/ significant hx of multiple CVAs presents w/ 1 day of vision change, R  arm weakness and L facial drooping, all of which spontaneously improved by the time of evaluation. Last had TIA in Oct of 2025. MRI revealed few acute to subacute small vessel infarcts in the L frontal centrum semiovale. -Admit to FMTS, attending Dr Orie for observation and risk mediation -Neuro consulted, appreciate their recs -Continue home lipitor  80mg  daily (PTA dosing was BID though recent neurology outpatient note with daily dosing), aspirin  81mg  daily, ticagrelor  90mg  BID for now -Consider switching to DOAC as above -Permissive HTN to 220/120, hold home amlodipine , carvedilol  for now but restart after 24 hours -UDS to be collected -PT/OT/SLP eval and treat -Frequent NIH stroke scale assessments -AM BMP, CBC CKD (chronic kidney disease), stage IV (HCC) Nearing HD (patient deciding HD or PD by Feb 2026). GFR stable here at 16. -Continue to monitor GFR here -Avoid nephrotoxic medications -Continue home sodium bicarb 650mg  TID Chronic health problem OSA: CPAP at bedtime Mild intermittent asthma - albuterol  PRN, singulair  10mg  qHS GAD - continue home fluoxetine  20mg  daily GERD - continue home protonix   FEN/GI: Renal, carb modified VTE Prophylaxis: Lovenox , renal dosing  Disposition: MedTele  History of Present Illness:  Christina Orozco is a 53 y.o. female presenting with right hand weakness and slurred speech.  This morning, she felt she could not move her right hand. She always has some deficits in the R arm from prior strokes, but this was worse this AM. By the time she got to the ED, she was able to regain function in the hand. She overall feels at her baseline now, though she notes some mild stuttering disrupting her speech.   She states she has had 15 strokes. She reports full compliance with her medications and  uses pill packs to keep them straight. She also has hyperextension in the L knee since her stroke and uses a rollator at home. She does feel like she has been slurring  her speech more today. PT also noticed some L facial drooping today at her appointment. In terms of vision, she has baseline black spots or blurred vision, but this was acutely worse and she reports that she could not see at all last night. She has also noticed a louder buzzing in her ear that has been worsening, but she has tinnitus at baseline and this has been worsening over multiple weeks.  In the ED, she had VSS and by the time she was evaluated she felt clinically back to baseline. Lab workup was consistent w/ her established CKDIV, normocytic anemia w/ h/h 8.7/27.2 vs 9.5/29.5 2 months prior, normal PT/INR, and ethanol WNL. CT head w/o showed no acute process but MRI brain w/o showed few small acute to subacute small vessel infarcts in the L frontal centrum semiovale, as well as chronic small vessel ischemic changes.   Review Of Systems: Per HPI.  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   Pertinent Past Surgical History: Appendectomy Adenoidectomy Vitrectomy Tonsillectomy Shoulder and foot surgery C-spine surgery  Remainder reviewed in history tab.   Pertinent Social History: Tobacco use: No  Alcohol  use: No, used to drink in the past Other Substance use: No Lives with her daughter who is a LAWYER. Has home health aide that is her niece.  Pertinent Family History: No history of strokes in family Mother: kidney disease, CAD s/p CABGx2, CHF Sister: HTN, diabetes  Important Outpatient Medications: Aspirin  81 mg daily Amlodipine  10 mg daily Lipitor  80 mg daily Carvedilol  12.5 mg 2 times daily Fluoxetine  20 mg daily Ozempic  0.5 mg for 10 days Pantoprazole  40 mg 2 times daily Senokot 8.6-50 mg 2 times daily Sodium bicarbonate  650 mg 3 times  daily Ticagrelor  90 mg 2 times daily Methocarbamol  500 mg every 8 hours as needed Albuterol  every 6 hours as needed Singulair  10 mg nightly  Objective: BP (!) 150/80 (BP Location: Right Arm)   Pulse 82   Temp 97.8 F (36.6 C) (Oral)   Resp 18   Ht 5' 3 (1.6 m)   Wt 65.3 kg   LMP 05/22/2018 (Within Days)   SpO2 100%   BMI 25.51 kg/m  Exam: General: Awake, alert, NAD. Communicates clearly w/ mild slurring/stuttering.  HEENT: NCAT Anicteric sclera, conjunctiva clear. MMM, clear OP w/ no exudates or erythema. Symmetrical uvular motion.  Cardio: RRR. Normal S1, S2. No murmur, rub, gallop. 2+ radial and dorsalis pedis pulses b/l w/ good capillary refill.  Resp: CTA bilaterally. No wheezes, rales, or rhonchi. Normal work of breathing on room air Abdomen: soft, non-tender, non-distended. Normoactive BS auscultated. No guarding, rigidity, or rebound.  Extremities: Full ROM w/ no TTP or obvious deformity. No swelling or sign of injury.   Skin: good skin turgor, no rash or lesions appreciated.  Neuro: AOx4. Cranial nerves II-XII intact. Horizontal leftward-beating nystagmus when looking to the left w/ questionable rightward nystagmus when looking to the right. Bilateral UE and LE strength 5/5. Sensation to light touch intact and symmetrical bilaterally. Mild-moderate dysmetria noted w/ finger to nose testing b/l.  Psych: Appropriate mood and affect. Thought content normal.  Labs:  CBC  BMET  Recent Labs  Lab 05/14/24 1551  WBC 4.6  HGB 8.7*  HCT 27.2*  PLT 194   Recent Labs  Lab 05/14/24 1551  NA 141  K 3.8  CL 111  CO2 26  BUN 39*  CREATININE 3.40*  GLUCOSE 120*  CALCIUM  8.8*     EKG: Sinus rhythm w/ prolonged Qtc of 530, equivocal ST depression in V5 stable vs prior  Imaging Studies Performed:  CT head WO contrast IMPRESSION: 1. No acute intracranial abnormality.  MRI brain WO contrast IMPRESSION: 1. Few subcentimeter acute to early subacute small vessel type  infarcts involving the left frontal centrum semiovale. No associated hemorrhage or mass effect. 2. Underlying advanced chronic microvascular ischemic disease. 3. Multiple chronic microhemorrhages as above, most likely related to chronic poorly controlled hypertension.  Manon Jester, DO 05/14/2024, 9:03 PM PGY-1, [place] [facility]  FPTS Intern pager: [phone], text pages welcome Secure chat group [facility] Teaching Service   I agree with the assessment and plan as documented above.  Stuart Redo, MD PGY-3, [facility]

## 2024-05-14 NOTE — Consult Note (Signed)
 NEUROLOGY CONSULT NOTE   Date of service: May 14, 2024 Patient Name: Christina Orozco MRN:  [id number] DOB:  1971/04/17 Chief Complaint: Right arm weakness Requesting Provider: Emil Share, DO  History of Present Illness  Alyce Inscore is a 53 y.o. female who has a past medical history of Anemia, Anxiety, Asthma, CKD (chronic kidney disease), DDD (degenerative disc disease), cervical, Depression, Diabetes mellitus (HCC), Dislocated shoulder, GERD (gastroesophageal reflux disease), Headache, History of kidney stones, Hypertension, Neuropathy, Peripheral vascular disease, Pneumonia, and Stroke (HCC)., who presented to the hospital today with increased right sided weakness. Therapist was in the home on Wednesday morning and called EMS around 1355 due to new facial drooping when talking and speech being slightly slurred. MRI brain was obtained, which revealed Three subcentimeter foci of diffusion signal abnormality seen involving the subcortical white matter of the left frontal centrum semiovale, suspicious for small acute to subacute infarctions.   Her last stroke was in April of this year, stated to be her most disabling, with resultant right arm and leg weakness.   Home medications include ASA, Brilinta  and atorvastatin .     ROS  Comprehensive ROS performed and pertinent positives documented in HPI    Past History   Past Medical History:  Diagnosis Date   Anemia    severe   Anxiety    Asthma    CKD (chronic kidney disease)    stage IV   DDD (degenerative disc disease), cervical    Depression    Diabetes mellitus (HCC)    Dislocated shoulder    right   GERD (gastroesophageal reflux disease)    Headache    migraines (about once a month)   History of kidney stones    Hypertension    Neuropathy    Peripheral vascular disease    Pneumonia    Stroke [facility])     Past Surgical History:  Procedure Laterality Date   ADENOIDECTOMY     APPENDECTOMY     CERVICAL SPINE  SURGERY  06/2012   C5-C6   EYE SURGERY     left eye surgery    FOOT SURGERY     KENALOG  INJECTION Left 03/10/2021   Procedure: Intravitreal Avastin  Injection;  Surgeon: Jarold Mayo, MD;  Location: [facility] OR;  Service: Ophthalmology;  Laterality: Left;   LOOP RECORDER INSERTION N/A 08/27/2017   Procedure: LOOP RECORDER INSERTION;  Surgeon: Francyne Headland, MD;  Location: [facility] INVASIVE CV LAB;  Service: Cardiovascular;  Laterality: N/A;   MEMBRANE PEEL Left 03/10/2021   Procedure: MEMBRANE PEEL;  Surgeon: Jarold Mayo, MD;  Location: [facility] OR;  Service: Ophthalmology;  Laterality: Left;   PARS PLANA VITRECTOMY Left 03/10/2021   Procedure: TWENTY-FIVE GAUGE PARS PLANA VITRECTOMY;  Surgeon: Jarold Mayo, MD;  Location: [facility] OR;  Service: Ophthalmology;  Laterality: Left;   PHOTOCOAGULATION WITH LASER Left 03/10/2021   Procedure: PHOTOCOAGULATION WITH LASER;  Surgeon: Jarold Mayo, MD;  Location: [facility] OR;  Service: Ophthalmology;  Laterality: Left;   SHOULDER SURGERY     TEE WITHOUT CARDIOVERSION N/A 08/27/2017   Procedure: TRANSESOPHAGEAL ECHOCARDIOGRAM (TEE);  Surgeon: Francyne Headland, MD;  Location: [facility] ENDOSCOPY;  Service: Cardiovascular;  Laterality: N/A;   TONSILLECTOMY      Family History: Family History  Problem Relation Age of Onset   Vascular Disease Mother    CAD Mother    Heart failure Mother    Heart disease Other    Cancer Other        colon, stomach, pancreatic, lung   Diabetes Other  Breast cancer Sister 30   Seizures Maternal Uncle     Social History  reports that she has never smoked. She has never used smokeless tobacco. She reports that she does not drink alcohol  and does not use drugs.  Allergies  Allergen Reactions   Lisinopril  Swelling and Other (See Comments)    Facial swelling  angioedema   Penicillins Hives    Has patient had a PCN reaction causing immediate rash, facial/tongue/throat swelling, SOB or lightheadedness with hypotension: yes Has patient had a PCN  reaction causing severe rash involving mucus membranes or skin necrosis: No Has patient had a PCN reaction that required hospitalization No Has patient had a PCN reaction occurring within the last 10 years: No If all of the above answers are NO, then may proceed wit Rash on buttock that makes sitting very difficult   Quetiapine Other (See Comments)    Unknown    Gabapentin  Swelling and Other (See Comments)    I thought I was having another stroke. dizziness    Medications  No current facility-administered medications for this encounter.  Current Outpatient Medications:    albuterol  (PROAIR  HFA) 108 (90 Base) MCG/ACT inhaler, INHALE 1 OR 2 PUFFS INTO THE LUNGS EVERY 6 HOURS AS NEEDED FOR WHEEZING OR SHORTNESS OF BREATH, Disp: 8.5 g, Rfl: 0   amLODipine  (NORVASC ) 10 MG tablet, TAKE 1 TABLET (10 MG TOTAL) BY MOUTH DAILY. (AM) (Patient taking differently: Take 10 mg by mouth in the morning.), Disp: 90 tablet, Rfl: 3   aspirin  EC 81 MG tablet, Take 1 tablet (81 mg total) by mouth 2 (two) times daily. Swallow whole., Disp: 120 tablet, Rfl: 0   atorvastatin  (LIPITOR ) 80 MG tablet, Take 1 tablet (80 mg total) by mouth at bedtime., Disp: 90 tablet, Rfl: 1   carvedilol  (COREG ) 12.5 MG tablet, Take 1 tablet (12.5 mg total) by mouth 2 (two) times daily with a meal., Disp: 180 tablet, Rfl: 2   diazepam  (VALIUM ) 2 MG tablet, Take 1 tablet (2 mg total) by mouth every 12 (twelve) hours as needed for up to 12 doses (vertigo)., Disp: 12 tablet, Rfl: 0   FLUoxetine  (PROZAC ) 20 MG capsule, Take 20 mg by mouth daily., Disp: , Rfl:    fluticasone  (FLONASE ) 50 MCG/ACT nasal spray, Place 2 sprays into both nostrils daily as needed for allergies or rhinitis., Disp: , Rfl:    meclizine  (ANTIVERT ) 25 MG tablet, Take 1 tablet (25 mg total) by mouth 3 (three) times daily as needed for up to 30 doses for dizziness., Disp: 30 tablet, Rfl: 0   melatonin 5 MG TABS, Take 1 tablet (5 mg total) by mouth at bedtime as  needed., Disp: 30 tablet, Rfl: 0   methocarbamol  (ROBAXIN ) 500 MG tablet, Take 1 tablet (500 mg total) by mouth every 8 (eight) hours as needed., Disp: 8 tablet, Rfl: 0   montelukast  (SINGULAIR ) 10 MG tablet, Take 10 mg by mouth at bedtime., Disp: , Rfl:    naphazoline-glycerin  (CLEAR EYES REDNESS) 0.012-0.25 % SOLN, Place 1-2 drops into the left eye 2 (two) times daily., Disp: , Rfl:    ondansetron  (ZOFRAN -ODT) 4 MG disintegrating tablet, Take 1 tablet (4 mg total) by mouth every 8 (eight) hours as needed for up to 15 doses for nausea or vomiting., Disp: 15 tablet, Rfl: 0   OZEMPIC , 0.25 OR 0.5 MG/DOSE, 2 MG/3ML SOPN, Inject 0.5 mg into the skin See admin instructions. Inject 0.5 mg subcutaneous every 10 days, Disp: , Rfl:  pantoprazole  (PROTONIX ) 40 MG tablet, Take 1 tablet (40 mg total) by mouth 2 (two) times daily., Disp: 60 tablet, Rfl: 0   senna-docusate (SENOKOT-S) 8.6-50 MG tablet, Take 1 tablet by mouth 2 (two) times daily., Disp: 28 tablet, Rfl: 0   sodium bicarbonate  650 MG tablet, Take 1 tablet (650 mg total) by mouth 3 (three) times daily., Disp: 90 tablet, Rfl: 0   ticagrelor  (BRILINTA ) 90 MG TABS tablet, Take 1 tablet (90 mg total) by mouth 2 (two) times daily., Disp: 60 tablet, Rfl: 0  Vitals   Vitals:   June 02, 2024 1700 2024-06-02 1715 06-02-2024 1900 06/02/2024 1912  BP: 139/71 (!) 155/85 (!) 150/80 (!) 150/80  Pulse: 79 80 79 82  Resp: 17 18  18   Temp:    97.8 F (36.6 C)  TempSrc:    Oral  SpO2: 100% 100% 100% 100%  Weight:      Height:        Body mass index is 25.51 kg/m.   Physical Exam   Constitutional: Appears well-developed and well-nourished.  Psych: Affect appropriate to situation.  Eyes: No scleral injection.  HENT: No OP obstruction.  Head: Normocephalic.  Cardiovascular: RRR on telemetry monitor Respiratory: Effort normal, non-labored breathing.  Skin: WDI.   Neurologic Examination   Mental Status: Alert, oriented x 5, thought content appropriate.   Speech fluent without evidence of aphasia. No significant dysarthria. Able to follow all commands without difficulty. Bright affect. Mild tangentiality.  Cranial Nerves: II: Decreased visual acuity in left eye (not able to read text, but can count fingers). Right eye with grossly intact vision, able to read text with bifocals. No visual field cut noted. PERRL.  III,IV, VI: No ptosis. EOM are full, but there is irregular and high-amplitude nystagmus with leftward gaze.   V: Temp sensation equal bilaterally  VII: Right facial droop VIII: Hearing intact to voice IX,X: No hoarseness XI: Head is midline XII: Midline tongue extension Motor: BUE 5/5 proximally and distally when testing resistance, but spasticity is noted and movements are mildly slowed.  BLE 4+/5 proximally with 4/5 strength to ADF/APF distally. Spasticity is noted.  Sensory: Temp and light touch intact to BUE and BLE No extinction to DSS.  Deep Tendon Reflexes: Hyperreflexic throughout: 3+ bilateral brachioradialis, biceps and triceps. 4+ bilateral patellae and achilles reflexes. Toes equivocal bilaterally.  Cerebellar: Mild ataxia with FNF bilaterally, worse on the left. H-S ataxic bilaterally.  Gait: Deferred   Labs/Imaging/Neurodiagnostic studies   CBC:  Recent Labs  Lab 06/02/2024 1551  WBC 4.6  NEUTROABS 2.7  HGB 8.7*  HCT 27.2*  MCV 92.8  PLT 194   Basic Metabolic Panel:  Lab Results  Component Value Date   NA 141 06-02-24   K 3.8 06/02/2024   CO2 26 06/02/2024   GLUCOSE 120 (H) 06/02/24   BUN 39 (H) 06-02-2024   CREATININE 3.40 (H) Jun 02, 2024   CALCIUM  8.8 (L) 06-02-2024   GFRNONAA 16 (L) 06-02-2024   GFRAA 27 (L) 06/17/2020   Lipid Panel:  Lab Results  Component Value Date   LDLCALC 44 09/22/2023   HgbA1c:  Lab Results  Component Value Date   HGBA1C 5.1 09/22/2023   Urine Drug Screen:     Component Value Date/Time   LABOPIA NONE DETECTED 09/22/2023 1848   COCAINSCRNUR NONE DETECTED  09/22/2023 1848   LABBENZ NONE DETECTED 09/22/2023 1848   AMPHETMU NONE DETECTED 09/22/2023 1848   THCU NONE DETECTED 09/22/2023 1848   LABBARB NONE DETECTED 09/22/2023 1848  Alcohol  Level     Component Value Date/Time   [facility] <15 05/14/2024 1551   INR  Lab Results  Component Value Date   INR 1.1 05/14/2024   APTT  Lab Results  Component Value Date   APTT 29 05/14/2024   Prior TTE (April 2025): 1. Left ventricular ejection fraction, by estimation, is 55 to 60%. The  left ventricle has normal function. The left ventricle has no regional  wall motion abnormalities. There is mild concentric left ventricular  hypertrophy. Left ventricular diastolic  parameters were normal.   2. Right ventricular systolic function is normal. The right ventricular  size is normal. There is normal pulmonary artery systolic pressure. The  estimated right ventricular systolic pressure is 19.0 mmHg.   3. A small pericardial effusion is present. The pericardial effusion is  circumferential. There is no evidence of cardiac tamponade.   4. The mitral valve is normal in structure. Trivial mitral valve  regurgitation. No evidence of mitral stenosis.   5. The aortic valve is tricuspid. Aortic valve regurgitation is not  visualized. No aortic stenosis is present.   6. The inferior vena cava is normal in size with greater than 50%  respiratory variability, suggesting right atrial pressure of 3 mmHg.   ASSESSMENT  Jacqulin Brandenburger is a 53 y.o. female with a past medical history of Anemia, Anxiety, Asthma, CKD (chronic kidney disease), DDD (degenerative disc disease), cervical, Depression, Diabetes mellitus (HCC), Dislocated shoulder, GERD (gastroesophageal reflux disease), Headache, History of kidney stones, Hypertension, Neuropathy, Peripheral vascular disease, Pneumonia, and Stroke (HCC)., who presented to the hospital today with increased right sided weakness. Therapist was in the home on Wednesday morning  and called EMS around 1355 due to new facial drooping when talking and speech being slightly slurred. MRI brain was obtained, which revealed Three subcentimeter foci of diffusion signal abnormality seen involving the subcortical white matter of the left frontal centrum semiovale, suspicious for small acute to subacute infarctions. Home medications include ASA, Brilinta  and atorvastatin .  - Exam reveals findings of asymmetric weakness and spasticity along with right facial droop that are consistent with the chronic diffuse and multifocal ischemic abnormalities on MRI as well as the acute left centrum semiovale ischemic infarctions. - CT head: Remote bilateral thalamic and left basal ganglia lacunar infarcts. Patchy white matter hypodensities are compatible with chronic microvascular ischemic disease. - MRI brain: Three subcentimeter foci of diffusion signal abnormality seen involving the subcortical white matter of the left frontal centrum semiovale, suspicious for small acute to subacute infarctions. Patchy and confluent T2 and FLAIR hyperintensity involving the periventricular and subcortical hemispheres as well as the pons, consistent with chronic small vessel ischemic disease, advanced for age, also associated with volume loss about the middle cerebellar peduncles. Multiple superimposed remote lacunar infarcts about the deep gray nuclei and pons. Multiple chronic microhemorrhages noted, most pronounced about the deep gray nuclei and brainstem, most characteristic of chronic poorly controlled hypertension.  - MRA head in April was normal.  - TTE in April with no mural thrombus or valvular vegetation mentioned in the report.  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  - Chronic diffuse limb spasticity, ataxia and BLE weakness secondary to chronic severe ischemic changes as seen on MRI.   RECOMMENDATIONS  1. HgbA1c,  fasting lipid panel 2. CTA of head and neck 3. PT consult, OT consult, Speech consult 4. Has had a recent TTE 5. Continue her home ASA, Brilinta   and atorvastatin . 6. Cardiac telemetry 7. Risk factor modification 8. BP management per standard protocol. Her small acute infarctions are unlikely to have a significant, if any, ischemic penumbra 9. Frequent neuro checks 10. NPO until passes stroke swallow screen 11. Will need long-term improvement in her hypertension. Has been counseled to start keeping a daily BP diary.  ______________________________________________________________________    Bonney SHARK, Laquilla Dault, MD [facility]

## 2024-05-14 NOTE — ED Notes (Addendum)
 Patient transported to MRI

## 2024-05-14 NOTE — ED Notes (Signed)
 Pt back from MRI

## 2024-05-14 NOTE — Assessment & Plan Note (Addendum)
 Patient w/ significant hx of multiple CVAs presents w/ 1 day of vision change, R arm weakness and L facial drooping, all of which spontaneously improved by the time of evaluation. Last had TIA in Oct of 2025. MRI revealed few acute to subacute small vessel infarcts in the L frontal centrum semiovale. -Admit to FMTS, attending Dr Orie for observation and risk mediation -Neuro consulted, appreciate their recs -Continue home lipitor  80mg  daily (PTA dosing was BID though recent neurology outpatient note with daily dosing), aspirin  81mg  daily, ticagrelor  90mg  BID for now -Consider switching to DOAC as above -Permissive HTN to 220/120, hold home amlodipine , carvedilol  for now but restart after 24 hours -UDS to be collected -PT/OT/SLP eval and treat -Frequent NIH stroke scale assessments -AM BMP, CBC

## 2024-05-14 NOTE — ED Triage Notes (Signed)
 Pt brought to ED by [facility] due to increase right sided weakness, particularly in the arm. Pt woke up around 10:15 am not feeling well but reports she not feeling well previous day. Family also reported that pt states she did not feel well previous day. Therapist was in the home this morning and called EMS around 1355 and reprots pt has left sided facial drooping when talking and speech slightly slurred. Equal bilateral grips. No drifting. LKW unknown at this time.   BP 144/90 P76  CBG 201 O2 97%

## 2024-05-15 ENCOUNTER — Encounter (HOSPITAL_COMMUNITY): Payer: Self-pay

## 2024-05-15 ENCOUNTER — Other Ambulatory Visit (HOSPITAL_COMMUNITY): Payer: Self-pay

## 2024-05-15 ENCOUNTER — Observation Stay (HOSPITAL_COMMUNITY)

## 2024-05-15 DIAGNOSIS — I639 Cerebral infarction, unspecified: Secondary | ICD-10-CM

## 2024-05-15 DIAGNOSIS — I6349 Cerebral infarction due to embolism of other cerebral artery: Secondary | ICD-10-CM

## 2024-05-15 LAB — CBC
HCT: 24 % — ABNORMAL LOW (ref 36.0–46.0)
Hemoglobin: 7.5 g/dL — ABNORMAL LOW (ref 12.0–15.0)
MCH: 29.4 pg (ref 26.0–34.0)
MCHC: 31.3 g/dL (ref 30.0–36.0)
MCV: 94.1 fL (ref 80.0–100.0)
Platelets: 169 K/uL (ref 150–400)
RBC: 2.55 MIL/uL — ABNORMAL LOW (ref 3.87–5.11)
RDW: 14 % (ref 11.5–15.5)
WBC: 4.2 K/uL (ref 4.0–10.5)
nRBC: 0 % (ref 0.0–0.2)

## 2024-05-15 LAB — BASIC METABOLIC PANEL WITH GFR
Anion gap: 4 — ABNORMAL LOW (ref 5–15)
BUN: 34 mg/dL — ABNORMAL HIGH (ref 6–20)
CO2: 23 mmol/L (ref 22–32)
Calcium: 8.3 mg/dL — ABNORMAL LOW (ref 8.9–10.3)
Chloride: 116 mmol/L — ABNORMAL HIGH (ref 98–111)
Creatinine, Ser: 3.01 mg/dL — ABNORMAL HIGH (ref 0.44–1.00)
GFR, Estimated: 18 mL/min — ABNORMAL LOW (ref >60)
Glucose, Bld: 115 mg/dL — ABNORMAL HIGH (ref 70–99)
Potassium: 3.3 mmol/L — ABNORMAL LOW (ref 3.5–5.1)
Sodium: 143 mmol/L (ref 135–145)

## 2024-05-15 LAB — RAPID URINE DRUG SCREEN, HOSP PERFORMED
Amphetamines: NOT DETECTED
Barbiturates: NOT DETECTED
Benzodiazepines: NOT DETECTED
Cocaine: NOT DETECTED
Opiates: NOT DETECTED
Tetrahydrocannabinol: NOT DETECTED

## 2024-05-15 MED ORDER — POTASSIUM CHLORIDE CRYS ER 20 MEQ PO TBCR
20.0000 meq | EXTENDED_RELEASE_TABLET | Freq: Once | ORAL | Status: AC
  Administered 2024-05-15: 20 meq via ORAL
  Filled 2024-05-15: qty 1

## 2024-05-15 MED ORDER — POTASSIUM CHLORIDE CRYS ER 20 MEQ PO TBCR: 40.0000 meq | EXTENDED_RELEASE_TABLET | Freq: Once | ORAL | Status: DC

## 2024-05-15 MED ORDER — CLOPIDOGREL BISULFATE 75 MG PO TABS
75.0000 mg | ORAL_TABLET | Freq: Every day | ORAL | 0 refills | Status: AC
  Filled 2024-05-15: qty 90, 90d supply, fill #0

## 2024-05-15 MED ORDER — ASPIRIN 81 MG PO TBEC
81.0000 mg | DELAYED_RELEASE_TABLET | Freq: Every day | ORAL | 0 refills | Status: AC
  Filled 2024-05-15: qty 21, 21d supply, fill #0

## 2024-05-15 MED ORDER — FLUOXETINE HCL 20 MG PO CAPS: 40.0000 mg | ORAL_CAPSULE | Freq: Every day | ORAL | Status: DC

## 2024-05-15 NOTE — Discharge Instructions (Addendum)
 Thank you for letting us  care for you during your stay.  You were admitted to the [facility] Medicine Teaching Service.   You were admitted for a stroke. Neurology was consulted. They changed your medication regimen so that you should take aspirin  81 mg every day for the next three weeks (21 days), but stop taking it after that. Stop taking your Brilinta  now. Start taking Plavix  every day.   We recommend follow up specifically with ophthalmology as scheduled on 05/30/24 and following up with neurology.   Please follow up with your primary care physician in the next week. Call them and let them know you need a hospital follow up appointment.  If your symptoms worsen or return, please return to the hospital.  Please let us  know if you have questions about your stay at [facility].

## 2024-05-15 NOTE — Progress Notes (Signed)
 Daily Progress Note Intern Pager: [phone]  Patient name: Christina Orozco Medical record number: [id number] Date of birth: 23-May-1971 Age: 53 y.o. Gender: female  Primary Care Provider: Evangelina Tinnie Norris, PA-C Consultants: Neurology Code Status: Full  Pt Overview and Major Events to Date:  12/3 - admitted  Assessment and Plan:  This is a 53 yo female with PMH of recurrent CVA, HTN, T2DM, HLD, CKD, OSA who is admitted for acute to early subacute small vessel type infarcts involving the left frontal centrum semiovale. She has had extensive stroke workup in the past, most recently in April 2025 at which time she was evaluated for vasculitis, which was negative.  Assessment & Plan Stroke [facility]) - neurology consulted, appreciate recs  - permissive HTN through tomorrow morning - continue statin, DAPT - neuro checks q4h - PT/OT recs CKD (chronic kidney disease), stage IV (HCC) Hypokalemia 3.3 today. Repleted this morning.  Creatinine improved this AM. - AM BMP Anemia Hgb 7.5 this morning. Last iron  11/24 46, saturation 19, ferritin 82. Does not seem like she is on an ESA outpatient on my review.  - AM CBC Chronic health problem OSA: CPAP at bedtime Mild intermittent asthma - albuterol  PRN, singulair  10mg  qHS GAD - continue home fluoxetine  40mg  daily GERD - continue home protonix   FEN/GI: renal PPx: lovenox  Dispo:Pending PT recommendations  pending clinical improvement .   Subjective:  She notes vision deficits that are new in the last two days. Otherwise, no other concerns.   Objective: Temp:  [97.7 F (36.5 C)-98.9 F (37.2 C)] 98.4 F (36.9 C) (12/04 0600) Pulse Rate:  [73-82] 80 (12/04 0600) Resp:  [17-20] 17 (12/04 0600) BP: (116-161)/(69-95) 161/82 (12/04 0600) SpO2:  [97 %-100 %] 100 % (12/04 0600) Weight:  [65.3 kg] 65.3 kg (12/03 1515) Physical Exam: General: well appearing woman who walked back from the bathroom to bed Cardiovascular: RRR, 2+  radial pulses bilaterally Respiratory: no increased WOB Neuro: sensation and strength intact in the BUE and BLE without focal deficit, inability to focus with EOM, central vision preserved but significantly diminished peripheral vision, Pupils sluggish but reactive, CN 3-12 intact  Laboratory: Most recent CBC Lab Results  Component Value Date   WBC 4.2 05/15/2024   HGB 7.5 (L) 05/15/2024   HCT 24.0 (L) 05/15/2024   MCV 94.1 05/15/2024   PLT 169 05/15/2024   Most recent BMP    Latest Ref Rng & Units 05/15/2024    3:12 AM  BMP  Glucose 70 - 99 mg/dL 884   BUN 6 - 20 mg/dL 34   Creatinine 9.55 - 1.00 mg/dL 6.98   Sodium 864 - 854 mmol/L 143   Potassium 3.5 - 5.1 mmol/L 3.3   Chloride 98 - 111 mmol/L 116   CO2 22 - 32 mmol/L 23   Calcium  8.9 - 10.3 mg/dL 8.3     Imaging/Diagnostic Tests: MR BRAIN WO CONTRAST Result Date: 05/14/2024 EXAM: MRI BRAIN WITHOUT CONTRAST 05/14/2024 06:01:34 PM TECHNIQUE: Multiplanar multisequence MRI of the head/brain was performed without the administration of intravenous contrast. COMPARISON: CT from earlier the same day as well as previous MRI from 11/23/2023. CLINICAL HISTORY: Neuro deficit, acute, stroke suspected. FINDINGS: BRAIN AND VENTRICLES: Subcentimeter focus of diffusion signal abnormality seen involving the subcortical white matter of the left frontal centrum semiovale, suspicious for small acute to subacute infarction (series 2, image 36). No associated acute hemorrhage or significant mass effect. Patchy and confluent T2 and FLAIR hyperintensity involving the periventricular and subcortical  hemispheres as well as the pons, consistent with chronic small vessel ischemic disease, advanced for age. Associated with volume loss about the middle cerebellar peduncles. Multiple superimposed remote lacunar infarcts about the deep gray nuclei and pons. Multiple chronic microhemorrhages noted, most pronounced about the deep gray nuclei and brainstem, most  characteristic of chronic poorly controlled hypertension. No midline shift. No hydrocephalus. The sella is unremarkable. Normal flow voids. ORBITS: No acute abnormality. SINUSES AND MASTOIDS: No acute abnormality. BONES AND SOFT TISSUES: Normal marrow signal. A small lipoma is noted at the right frontal scalp. There is prior bilateral acromioclavicular joint placement. No acute soft tissue injury. IMPRESSION: 1. Few subcentimeter acute to early subacute small vessel type infarcts involving the left frontal centrum semiovale. No associated hemorrhage or mass effect. 2. Underlying advanced chronic microvascular ischemic disease. 3. Multiple chronic microhemorrhages as above, most likely related to chronic poorly controlled hypertension. Electronically signed by: Morene Hoard MD 05/14/2024 07:11 PM EST RP Workstation: [id number]   CT HEAD WO CONTRAST Result Date: 05/14/2024 EXAM: CT HEAD WITHOUT CONTRAST 05/14/2024 04:06:34 PM TECHNIQUE: CT of the head was performed without the administration of intravenous contrast. Automated exposure control, iterative reconstruction, and/or weight based adjustment of the mA/kV was utilized to reduce the radiation dose to as low as reasonably achievable. COMPARISON: CT head 03/11/24 CLINICAL HISTORY: Neuro deficit, acute, stroke suspected FINDINGS: Motion limited study. Within this limitations. BRAIN AND VENTRICLES: No acute hemorrhage. No evidence of acute infarct. No hydrocephalus. No extra-axial collection. No mass effect or midline shift. Remote bilateral thalamic and left basal ganglia lacunar infarcts. Patchy white matter hypodensities are compatible with chronic microvascular ischemic disease. ORBITS: No acute abnormality. SINUSES: No acute abnormality. SOFT TISSUES AND SKULL: No acute soft tissue abnormality. No skull fracture. IMPRESSION: 1. No acute intracranial abnormality. Electronically signed by: Gilmore Molt MD 05/14/2024 04:13 PM EST RP Workstation:  [id number]    Alena Morrison, Taliesin Hartlage, MD 05/15/2024, 7:26 AM  PGY-1, [facility] Family Medicine FPTS Intern pager: [phone], text pages welcome Secure chat group [facility] Teaching Service

## 2024-05-15 NOTE — TOC CAGE-AID Note (Signed)
 Transition of Care [facility]) - CAGE-AID Screening   Patient Details  Name: Christina Orozco MRN: [id number] Date of Birth: Mar 03, 1971  [facility]) CM/SW Contact:    Kamyah Wilhelmsen E Dewon Mendizabal, LCSW Phone Number: 05/15/2024, 1:46 PM   Clinical Narrative:    CAGE-AID Screening:    Have You Ever Felt You Ought to Cut Down on Your Drinking or Drug Use?: No Have People Annoyed You By [employer] Your Drinking Or Drug Use?: No Have You Felt Bad Or Guilty About Your Drinking Or Drug Use?: No Have You Ever Had a Drink or Used Drugs First Thing In The Morning to Steady Your Nerves or to Get Rid of a Hangover?: No CAGE-AID Score: 0  Substance Abuse Education Offered: No

## 2024-05-15 NOTE — Evaluation (Signed)
 Speech Language Pathology Evaluation Patient Details Name: Christina Orozco MRN: [id number] DOB: 04-07-1971 Today's Date: 05/15/2024 Time:  -     Problem List:  Patient Active Problem List   Diagnosis Date Noted   Stroke (HCC) 05/14/2024   Chronic health problem 05/14/2024   Left pontine stroke (HCC) 09/21/2023   Fecal occult blood test positive 04/23/2023   Stroke (cerebrum) (HCC) 04/17/2023   Acute ischemic stroke (HCC) 04/14/2023   Prolonged QT interval 04/14/2023   Diplopia 10/31/2022   OSA (obstructive sleep apnea) 08/25/2022   Subcortical infarction (HCC) 08/23/2022   Overweight (BMI 25.0-29.9) 08/23/2022   Asthma, chronic 08/22/2022   TIA (transient ischemic attack) 06/20/2022   Numbness and tingling in left hand 03/02/2022   Gait disturbance, post-stroke 03/02/2022   AKI (acute kidney injury) 12/23/2021   Acute embolic stroke (HCC) 12/23/2021   Hypernatremia 12/20/2021   CKD (chronic kidney disease), stage IV (HCC) 12/19/2021   Left-sided weakness and visual deficits  12/19/2021   Vision abnormalities 12/19/2021   Acute stroke due to ischemia (HCC) 11/10/2021   Depression 07/05/2020   CVA (cerebral vascular accident) (HCC) 06/21/2020   Generalized anxiety disorder 03/01/2019   Abnormality of gait 10/14/2018   Dizziness and giddiness 09/13/2018   Generalized weakness 06/26/2018   Chronic nonintractable headache 04/26/2018   Diastolic dysfunction    Congestion of nasal sinus    Pneumonia of left lower lobe due to infectious organism    SOB (shortness of breath)    Candidiasis    History of anemia due to chronic kidney disease    Anemia    Hypoalbuminemia due to protein-calorie malnutrition    Hypertension associated with diabetes (HCC)    Type 2 diabetes mellitus with chronic kidney disease, without long-term current use of insulin  (HCC)    PRES (posterior reversible encephalopathy syndrome)    Hypokalemia    History of CVA with residual deficit    History of  seizure 03/25/2018   Bilateral carpal tunnel syndrome 02/14/2018   Arthritis 10/31/2017   Asthma 10/31/2017   Constipation 10/31/2017   Kidney stone 10/31/2017   Anemia, chronic disease 08/09/2017   Hyperlipidemia associated with type 2 diabetes mellitus (HCC) 07/26/2017   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HCC) 07/25/2017   Anxiety as acute reaction to exceptional stress 04/23/2017   Insomnia 10/08/2016   Neuropathy 06/07/2016   Gait instability    Lesion of pons    Slurred speech    Vertigo 04/30/2016   Hyperglycemia 04/30/2016   Chronic kidney disease (CKD), stage III (moderate) (HCC) 04/30/2016   Ataxia 04/30/2016   Chronic right shoulder pain 04/03/2016   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   Rhinitis, allergic 08/25/2015   Mild intermittent asthma 07/14/2015   Gastroesophageal reflux disease without esophagitis 06/08/2015   Hypertensive cardiovascular disease 05/05/2015   Vitamin D  deficiency 05/05/2015   Past Medical History:  Past Medical History:  Diagnosis Date   Anemia    severe   Anxiety    Asthma    CKD (chronic kidney disease)    stage IV   DDD (degenerative disc disease), cervical    Depression    Diabetes mellitus (HCC)    Dislocated shoulder    right   GERD (gastroesophageal reflux disease)    Headache    migraines (about once a month)   History of kidney stones    Hypertension    Neuropathy    Peripheral vascular disease    Pneumonia    Stroke [facility])    Past Surgical History:  Past Surgical History:  Procedure Laterality Date   ADENOIDECTOMY     APPENDECTOMY     CERVICAL SPINE SURGERY  06/2012   C5-C6   EYE SURGERY     left eye surgery    FOOT SURGERY     KENALOG  INJECTION Left 03/10/2021   Procedure: Intravitreal Avastin  Injection;  Surgeon: Jarold Mayo, MD;  Location: [facility] OR;  Service: Ophthalmology;  Laterality: Left;   LOOP RECORDER INSERTION N/A 08/27/2017   Procedure: LOOP RECORDER INSERTION;  Surgeon: Francyne Headland, MD;  Location: [facility]  INVASIVE CV LAB;  Service: Cardiovascular;  Laterality: N/A;   MEMBRANE PEEL Left 03/10/2021   Procedure: MEMBRANE PEEL;  Surgeon: Jarold Mayo, MD;  Location: [facility] OR;  Service: Ophthalmology;  Laterality: Left;   PARS PLANA VITRECTOMY Left 03/10/2021   Procedure: TWENTY-FIVE GAUGE PARS PLANA VITRECTOMY;  Surgeon: Jarold Mayo, MD;  Location: [facility] OR;  Service: Ophthalmology;  Laterality: Left;   PHOTOCOAGULATION WITH LASER Left 03/10/2021   Procedure: PHOTOCOAGULATION WITH LASER;  Surgeon: Jarold Mayo, MD;  Location[facility] OR;  Service: Ophthalmology;  Laterality: Left;   SHOULDER SURGERY     TEE WITHOUT CARDIOVERSION N/A 08/27/2017   Procedure: TRANSESOPHAGEAL ECHOCARDIOGRAM (TEE);  Surgeon: Francyne Headland, MD;  [facility] ENDOSCOPY;  Service: Cardiovascular;  Laterality: N/A;   TONSILLECTOMY     HPI:  53 yo F adm 05/14/24 with increased Rt weakness. MRI; Few subcentimeter acute to early subacute small vessel type infarcts  involving the left frontal centrum semiovale. Pt has history of dysarthria, dysnomia and cognitive deficits treated at OP 2024. Most recent SLE 09/2023 revealed functional language/cognition, pt at baseline and no further ST needed. PMHx: HTN, T2DM with retinopathy, neuropathy, nephropathy, MDD, OSA, multiple CVA, CKD, HLD, asthma, anemia, depression, OSA on CPAP.   Assessment / Plan / Recommendation Clinical Impression  ST services is familiar with pt from prior admissions. Today she presents at her baseline without new concerns re: speech-language-cognition with acute stroke. She exhibits minimal dysarthria, occasional hesitations in speech and reports this is her baseline with intact language. Pt acknowledges memory impairments from prior CVA's that have not worsened. She  compensates using her phone to log information, records information from doctor's appointments or has daughter accompany her. Today she scored within average range for memory on word recall and cognitive  tasks with exception of one error on calculation. Pt's daughter lives with her, has a personal care attendant, manages her finances and medications wtihout difficulty (pre packaged meds). Pt feels she is at baseline status and SLP agrees pt does not need further ST services at this time.    SLP Assessment  SLP Recommendation/Assessment: Patient does not need any further Speech Language Pathology Services SLP Visit Diagnosis: Cognitive communication deficit (R41.841)     Assistance Recommended at Discharge     Functional Status Assessment Patient has not had a recent decline in their functional status  Frequency and Duration           SLP Evaluation Cognition  Overall Cognitive Status: History of cognitive impairments - at baseline Arousal/Alertness: Lethargic Orientation Level: Oriented to situation (not formally assessed for temporal, place) Attention: Sustained Sustained Attention: Appears intact Memory: Appears intact (average rangefor 4 word recall) Awareness: Appears intact Problem Solving: Appears intact (overall intact, difficulty with calculation x 1) Safety/Judgment: Appears intact       Comprehension  Auditory Comprehension Overall Auditory Comprehension: Appears within functional limits for tasks assessed Commands: Within Functional Limits Visual Recognition/Discrimination Discrimination: Not tested Reading Comprehension  Reading Status: Not tested    Expression Expression Primary Mode of Expression: Verbal Verbal Expression Overall Verbal Expression: Appears within functional limits for tasks assessed (see impression for additional information) Initiation: No impairment Level of Generative/Spontaneous Verbalization: Conversation Repetition:  (NT) Naming: No impairment Pragmatics: No impairment Written Expression Written Expression: Not tested   Oral / Motor  Oral Motor/Sensory Function Overall Oral Motor/Sensory Function: Within functional limits Motor  Speech Overall Motor Speech: Impaired at baseline (min dysarthria- baseline per pt) Respiration: Within functional limits Phonation: Normal Resonance: Within functional limits Intelligibility: Intelligible Motor Planning: Within functional limits Motor Speech Errors: Not applicable            Dustin Olam Bull 05/15/2024, 1:34 PM

## 2024-05-15 NOTE — Plan of Care (Signed)
 Meal ordered. Brief in use. Voices needs.   Problem: Education: Goal: Knowledge of General Education information will improve Description: Including pain rating scale, medication(s)/side effects and non-pharmacologic comfort measures Outcome: Progressing   Problem: Activity: Goal: Risk for activity intolerance will decrease Outcome: Progressing   Problem: Nutrition: Goal: Adequate nutrition will be maintained Outcome: Progressing   Problem: Safety: Goal: Ability to remain free from injury will improve Outcome: Progressing   Problem: Skin Integrity: Goal: Risk for impaired skin integrity will decrease Outcome: Progressing

## 2024-05-15 NOTE — Evaluation (Signed)
 Physical Therapy Brief Evaluation and Discharge Note Patient Details Name: Christina Orozco MRN: [id number] DOB: 07/21/1970 Today's Date: 05/15/2024   History of Present Illness  53 yo F adm 05/14/24 with increased Rt weakness. MRI; Few subcentimeter acute to early subacute small vessel type infarcts  involving the left frontal centrum semiovale.  PMHx: HTN, T2DM with retinopathy, neuropathy, nephropathy, MDD, OSA, multiple CVA, CKD, HLD, asthma, anemia, depression, OSA on CPAP.  Clinical Impression  Pt very pleasant and reports being able to walk with rollator at baseline and have assist of family and PCA. Pt states no additional weakness at this time but states that when she looks to her left all people have fully black eyes including sclera horror eyes per pt. When she turns her head to the left but closes left eye this does not occur and if looking forward with Rt eye occluded she sees bouncing or uneven eyes. She reports no other changes with vision to rest of environment. Pt states strength and function back to baseline with pt able to walk with rollator without assist for transfers or gait. Will defer further vision changes to OT and sign off acutely.        PT Assessment Patient does not need any further PT services  Assistance Needed at Discharge  PRN    Equipment Recommendations None recommended by PT  Recommendations for Other Services       Precautions/Restrictions Precautions Precautions: Fall Recall of Precautions/Restrictions: Intact Precaution/Restrictions Comments: impaired vision on L side        Mobility  Bed Mobility Rolling: Modified independent (Device/Increase time) Supine/Sidelying to sit: Modified independent (Device/Increased time)   General bed mobility comments: pt able to transfer off and onto stretcher with HOB elevated without assist  Transfers Overall transfer level: Needs assistance Equipment used: Rollator (4 wheels) Transfers: Sit to/from  Stand Sit to Stand: Supervision           General transfer comment: supervision for lines    Ambulation/Gait Ambulation/Gait assistance: Supervision Gait Distance (Feet): 150 Feet Assistive device: Rollator (4 wheels) Gait Pattern/deviations: Step-through pattern, Decreased stride length Gait Speed: Pace WFL General Gait Details: slight hyperextension of knees with gait, pt avoiding looking at anyone to her left during gait, steady gait with rollator, cues to return to room  Home Activity Instructions    Stairs            Modified Rankin (Stroke Patients Only) Modified Rankin (Stroke Patients Only) Pre-Morbid Rankin Score: Moderate disability Modified Rankin: Moderate disability      Balance Overall balance assessment: Mild deficits observed, not formally tested Sitting-balance support: No upper extremity supported, Feet supported Sitting balance-Leahy Scale: Good     Standing balance support: Bilateral upper extremity supported, No upper extremity supported, During functional activity Standing balance-Leahy Scale: Fair            Pertinent Vitals/Pain PT - Brief Vital Signs All Vital Signs Stable: Yes Pain Assessment Pain Assessment: No/denies pain     Home Living Family/patient expects to be discharged to:: Private residence Living Arrangements: Children Available Help at Discharge: Family;Personal care attendant;Available PRN/intermittently Home Environment: Level entry   Home Equipment: Tub bench;Grab bars - tub/shower;Rollator (4 wheels);Rolling Walker (2 wheels);Cane - single point;BSC/3in1   Additional Comments: PCA 3 x week for 3-4 hours. currently working with HHPT/OT per pt    Prior Function Level of Independence: Needs assistance      UE/LE Assessment   UE ROM/Strength/Tone/Coordination: [facility]    LE ROM/Strength/Tone/Coordination: WFL (  baseline deficits from prior strokes with decreased strength and slight knee hyperextension with  gait)      Communication   Communication Communication: No apparent difficulties Factors Affecting Communication: Reduced clarity of speech     Cognition Overall Cognitive Status: Impaired Comments: reports vision on left side or with only left eye as people with fully black eyes horror eyes according to pt but she doesn't state any other vision deficits other than looking at others eyes on left     General Comments      Exercises     Assessment/Plan    PT Problem List         PT Visit Diagnosis Other abnormalities of gait and mobility (R26.89)    No Skilled PT All education completed;Patient will have necessary level of assist by caregiver at discharge;Patient at baseline level of functioning   Co-evaluation                AMPAC 6 Clicks Help needed turning from your back to your side while in a flat bed without using bedrails?: None Help needed moving from lying on your back to sitting on the side of a flat bed without using bedrails?: None Help needed moving to and from a bed to a chair (including a wheelchair)?: None Help needed standing up from a chair using your arms (e.g., wheelchair or bedside chair)?: A Little Help needed to walk in hospital room?: A Little Help needed climbing 3-5 steps with a railing? : A Little 6 Click Score: 21      End of Session   Activity Tolerance: Patient tolerated treatment well Patient left: in bed;with call bell/phone within reach;Other (comment) (neurologist present) Nurse Communication: Mobility status PT Visit Diagnosis: Other abnormalities of gait and mobility (R26.89)     Time: 0950-1007 PT Time Calculation (min) (ACUTE ONLY): 17 min  Charges:   PT Evaluation $PT Eval Low Complexity: 1 Low      Jaeden Westbay P, PT Acute Rehabilitation Services Office: [phone]   Lenoard NOVAK Oluwafemi Villella  05/15/2024, 11:16 AM

## 2024-05-15 NOTE — Discharge Summary (Addendum)
 Family Medicine Teaching [facility] Discharge Summary  Patient name: Christina Orozco Medical record number: [id number] Date of birth: [birth year] Age: 53 y.o. Gender: female Date of Admission: 05/14/2024  Date of Discharge: 05/15/24 Admitting Physician: Leafy Scriver, DO  Primary Care Provider: Evangelina Tinnie Norris, PA-C Consultants: neurology  Indication for Hospitalization: stroke  Discharge Diagnoses/Problem List:  Principal Problem:   Stroke [facility]) Active Problems:   CKD (chronic kidney disease), stage IV [facility])   Anemia  Brief Hospital Course:  This is a 53 year old female with PMH of recurrent CVA, HTN, T2DM, HLD, CKD, OSA who was admitted to the [facility] Medicine Teaching service for left frontal centrum semiovale stroke. Her hospital course is detailed below:  Acute stroke Patient presented to ED after right hand weakness morning of presentation.  In the ED, she reported feeling clinically back to baseline and CT head did not reveal acute abnormalities.  MRI brain showed few small acute to subacute small vessel infarcts in the left frontal centrum semiovale, as well as chronic small vessel ischemic changes.  Patient with history of multiple strokes in the past with negative work up. She is followed by neurology outpatient. Neurology was consulted and recommended discontinuation of home Brilinta  and new regimen of aspirin  81 mg for 21 days and Plavix  75 mg daily indefinitely.  Patient to continue atorvastatin .  Patient had normal cardiac telemetry during admission.  Carotid duplex and transcranial doppler obtained this admission, but final result still pending at time of discharge.  Given recent echocardiogram in April 2025, repeat was not performed.  PT/OT consulted and recommended returning home with home health. Patient was stable at time of discharge   Vision Changes Patient reported some concern for retinal detachment in October 2025 with follow up  scheduled for 05/30/24. Patient reports since October, she has worsening of her vision. Evaluated by neurology who recommended outpatient follow up.   Other chronic medical conditions managed during this hospitalization include OSA, CKD, anemia, mild asthma, GAD, GERD.  PCP recommendations: Ophthalmology follow-up on 05/30/2024 Neurology follow-up Patient to consider HD for CKD in Feb 2026, consider further discussion     Results/Tests Pending at Time of Discharge:  Final read of Transcranial doppler and Carotid doppler pending at discharge  Disposition: Home with [facility]  Discharge Condition: Stable  Discharge Exam:  Vitals:   05/15/24 1250 05/15/24 1552  BP: (!) 164/80 (!) 156/75  Pulse: 80 73  Resp: 18 20  Temp: 98 F (36.7 C) 98.5 F (36.9 C)  SpO2: 100% 100%   General: well appearing woman who walked back from the bathroom to bed Cardiovascular: RRR, 2+ radial pulses bilaterally Respiratory: no increased WOB Neuro: sensation and strength intact in the BUE and BLE without focal deficit, inability to focus with EOM, central vision preserved but significantly diminished peripheral vision, Pupils sluggish but reactive, CN 3-12 intact  Significant Procedures: None  Significant Labs and Imaging:  Recent Labs  Lab 05/14/24 1551 05/15/24 0312  WBC 4.6 4.2  HGB 8.7* 7.5*  HCT 27.2* 24.0*  PLT 194 169   Recent Labs  Lab 05/14/24 1551 05/15/24 0312  NA 141 143  K 3.8 3.3*  CL 111 116*  CO2 26 23  GLUCOSE 120* 115*  BUN 39* 34*  CREATININE 3.40* 3.01*  CALCIUM  8.8* 8.3*  MG 2.3  --   ALKPHOS 45  --   AST 15  --   ALT 11  --   ALBUMIN 3.5  --  VAS US  TRANSCRANIAL DOPPLER Result Date: 05/15/2024  Transcranial Doppler Patient Name:  Christina Orozco  Date of Exam:   05/15/2024 Medical Rec #: [id number]         Accession #:    [id number] Date of Birth: 03-27-1971        Patient Gender: F Patient Age:   15 years Exam Location:  [facility] Procedure:      VAS  US  TRANSCRANIAL DOPPLER Referring Phys: MILDA PRUETT --------------------------------------------------------------------------------  Indications: Stroke. Comparison Study: No previous exams Performing Technologist: Jody Hill RVT, RDMS  Examination Guidelines: A complete evaluation includes B-mode imaging, spectral Doppler, color Doppler, and power Doppler as needed of all accessible portions of each vessel. Bilateral testing is considered an integral part of a complete examination. Limited examinations for reoccurring indications may be performed as noted.  +----------+---------------+----------+-----------+------------------+ RIGHT TCD Right VM (cm/s)Depth (cm)Pulsatility     Comment       +----------+---------------+----------+-----------+------------------+ MCA                                           unable to insonate +----------+---------------+----------+-----------+------------------+ ACA                                           unable to insonate +----------+---------------+----------+-----------+------------------+ Term ICA                                      unable to insonate +----------+---------------+----------+-----------+------------------+ PCA P1                                        unable to insonate +----------+---------------+----------+-----------+------------------+ Opthalmic       13                    1.51                       +----------+---------------+----------+-----------+------------------+ ICA siphon                                    unable to insonate +----------+---------------+----------+-----------+------------------+ Vertebral       -20                   1.33                       +----------+---------------+----------+-----------+------------------+  +----------+--------------+----------+-----------+-------+ LEFT TCD  Left VM (cm/s)Depth (cm)PulsatilityComment  +----------+--------------+----------+-----------+-------+ MCA             56                   1.21            +----------+--------------+----------+-----------+-------+ ACA            -14                   0.97            +----------+--------------+----------+-----------+-------+ Term ICA        41  1.17            +----------+--------------+----------+-----------+-------+ PCA P1          20                   1.16            +----------+--------------+----------+-----------+-------+ Opthalmic       17                   1.79            +----------+--------------+----------+-----------+-------+ ICA siphon      41                   1.28            +----------+--------------+----------+-----------+-------+ Vertebral      -26                   1.06            +----------+--------------+----------+-----------+-------+  +------------+-------+------------------+             VM cm/s     Comment       +------------+-------+------------------+ Prox Basilar  -19         1.00        +------------+-------+------------------+ Dist Basilar       unable to insonate +------------+-------+------------------+    Preliminary    VAS US  CAROTID Result Date: 05/15/2024 Carotid Arterial Duplex Study Patient Name:  Christina Orozco  Date of Exam:   05/15/2024 Medical Rec #: [id number]         Accession #:    [id number] Date of Birth: 1971/04/29        Patient Gender: F Patient Age:   73 years Exam Location:  [facility]: [facility] Procedure:      VAS US  CAROTID Referring Phys: MILDA PRUETT --------------------------------------------------------------------------------  Indications:       CVA. Risk Factors:      Hypertension, hyperlipidemia, no history of smoking, prior                    CVA. Other Factors:     CKD. Comparison Study:  Previous exam on 09/23/2023 was WNL Performing Technologist: Ezzie Potters RVT, RDMS  Examination Guidelines: A complete evaluation includes  B-mode imaging, spectral Doppler, color Doppler, and power Doppler as needed of all accessible portions of each vessel. Bilateral testing is considered an integral part of a complete examination. Limited examinations for reoccurring indications may be performed as noted.  Right Carotid Findings: +----------+--------+--------+--------+---------------------+------------------+           PSV cm/sEDV cm/sStenosisPlaque Description   Comments           +----------+--------+--------+--------+---------------------+------------------+ CCA Prox  64      12                                   intimal thickening +----------+--------+--------+--------+---------------------+------------------+ CCA Distal63      12                                                      +----------+--------+--------+--------+---------------------+------------------+ ICA Prox  51      14              focal and  heterogenous                            +----------+--------+--------+--------+---------------------+------------------+ ICA Distal90      21                                                      +----------+--------+--------+--------+---------------------+------------------+ ECA       73      5                                                       +----------+--------+--------+--------+---------------------+------------------+ +----------+--------+-------+----------------+-------------------+           PSV cm/sEDV cmsDescribe        Arm Pressure (mmHG) +----------+--------+-------+----------------+-------------------+ [id number]            Multiphasic, WNL                    +----------+--------+-------+----------------+-------------------+ +---------+--------+--+--------+-+---------+ VertebralPSV cm/s39EDV cm/s5Antegrade +---------+--------+--+--------+-+---------+  Left Carotid Findings:  +----------+-------+--------+--------+-----------------------+-----------------+           PSV    EDV cm/sStenosisPlaque Description     Comments                    cm/s                                                            +----------+-------+--------+--------+-----------------------+-----------------+ CCA Prox  79     12                                                       +----------+-------+--------+--------+-----------------------+-----------------+ CCA Distal60     17              calcific and           intimal                                            heterogenous           thickening        +----------+-------+--------+--------+-----------------------+-----------------+ ICA Prox  65     17                                                       +----------+-------+--------+--------+-----------------------+-----------------+ ICA Distal84     25                                                       +----------+-------+--------+--------+-----------------------+-----------------+  ECA       80     6                                                        +----------+-------+--------+--------+-----------------------+-----------------+ +----------+--------+--------+----------------+-------------------+           PSV cm/sEDV cm/sDescribe        Arm Pressure (mmHG) +----------+--------+--------+----------------+-------------------+ [id number]             Multiphasic, WNL                    +----------+--------+--------+----------------+-------------------+ +---------+--------+--+--------+--+---------+ VertebralPSV cm/s40EDV cm/s11Antegrade +---------+--------+--+--------+--+---------+   Summary: Right Carotid: The extracranial vessels were near-normal with only minimal wall                thickening or plaque. Left Carotid: The extracranial vessels were near-normal with only minimal wall               thickening or plaque. Vertebrals:   Bilateral vertebral arteries demonstrate antegrade flow. Subclavians: Normal flow hemodynamics were seen in bilateral subclavian              arteries. *See table(s) above for measurements and observations.     Preliminary    MR BRAIN WO CONTRAST Result Date: 05/14/2024 EXAM: MRI BRAIN WITHOUT CONTRAST 05/14/2024 06:01:34 PM TECHNIQUE: Multiplanar multisequence MRI of the head/brain was performed without the administration of intravenous contrast. COMPARISON: CT from earlier the same day as well as previous MRI from 11/23/2023. CLINICAL HISTORY: Neuro deficit, acute, stroke suspected. FINDINGS: BRAIN AND VENTRICLES: Subcentimeter focus of diffusion signal abnormality seen involving the subcortical white matter of the left frontal centrum semiovale, suspicious for small acute to subacute infarction (series 2, image 36). No associated acute hemorrhage or significant mass effect. Patchy and confluent T2 and FLAIR hyperintensity involving the periventricular and subcortical hemispheres as well as the pons, consistent with chronic small vessel ischemic disease, advanced for age. Associated with volume loss about the middle cerebellar peduncles. Multiple superimposed remote lacunar infarcts about the deep gray nuclei and pons. Multiple chronic microhemorrhages noted, most pronounced about the deep gray nuclei and brainstem, most characteristic of chronic poorly controlled hypertension. No midline shift. No hydrocephalus. The sella is unremarkable. Normal flow voids. ORBITS: No acute abnormality. SINUSES AND MASTOIDS: No acute abnormality. BONES AND SOFT TISSUES: Normal marrow signal. A small lipoma is noted at the right frontal scalp. There is prior bilateral acromioclavicular joint placement. No acute soft tissue injury. IMPRESSION: 1. Few subcentimeter acute to early subacute small vessel type infarcts involving the left frontal centrum semiovale. No associated hemorrhage or mass effect. 2. Underlying advanced  chronic microvascular ischemic disease. 3. Multiple chronic microhemorrhages as above, most likely related to chronic poorly controlled hypertension. Electronically signed by: Morene Hoard MD 05/14/2024 07:11 PM EST RP Workstation: [id number]   CT HEAD WO CONTRAST Result Date: 05/14/2024 EXAM: CT HEAD WITHOUT CONTRAST 05/14/2024 04:06:34 PM TECHNIQUE: CT of the head was performed without the administration of intravenous contrast. Automated exposure control, iterative reconstruction, and/or weight based adjustment of the mA/kV was utilized to reduce the radiation dose to as low as reasonably achievable. COMPARISON: CT head 03/11/24 CLINICAL HISTORY: Neuro deficit, acute, stroke suspected FINDINGS: Motion limited study. Within this limitations. BRAIN AND VENTRICLES: No acute hemorrhage. No evidence of acute infarct. No hydrocephalus. No extra-axial  collection. No mass effect or midline shift. Remote bilateral thalamic and left basal ganglia lacunar infarcts. Patchy white matter hypodensities are compatible with chronic microvascular ischemic disease. ORBITS: No acute abnormality. SINUSES: No acute abnormality. SOFT TISSUES AND SKULL: No acute soft tissue abnormality. No skull fracture. IMPRESSION: 1. No acute intracranial abnormality. Electronically signed by: Gilmore Molt MD 05/14/2024 04:13 PM EST RP Workstation: [id number]     Discharge Medications:  Allergies as of 05/15/2024       Reactions   Lisinopril  Swelling, Other (See Comments)   Facial swelling  angioedema   Penicillins Hives   Has patient had a PCN reaction causing immediate rash, facial/tongue/throat swelling, SOB or lightheadedness with hypotension: yes Has patient had a PCN reaction causing severe rash involving mucus membranes or skin necrosis: No Has patient had a PCN reaction that required hospitalization No Has patient had a PCN reaction occurring within the last 10 years: No If all of the above answers are NO, then  may proceed wit Rash on buttock that makes sitting very difficult   Quetiapine Other (See Comments)   Unknown reaction   Gabapentin  Swelling, Other (See Comments)   I thought I was having another stroke. dizziness        Medication List     STOP taking these medications    diazepam  2 MG tablet Commonly known as: Valium    meclizine  25 MG tablet Commonly known as: ANTIVERT    methocarbamol  500 MG tablet Commonly known as: ROBAXIN    ticagrelor  90 MG Tabs tablet Commonly known as: BRILINTA        TAKE these medications    albuterol  108 (90 Base) MCG/ACT inhaler Commonly known as: ProAir  HFA INHALE 1 OR 2 PUFFS INTO THE LUNGS EVERY 6 HOURS AS NEEDED FOR WHEEZING OR SHORTNESS OF BREATH   amLODipine  10 MG tablet Commonly known as: NORVASC  TAKE 1 TABLET (10 MG TOTAL) BY MOUTH DAILY. (AM) What changed: See the new instructions.   aspirin  EC 81 MG tablet Take 1 tablet (81 mg total) by mouth daily for 21 days. Swallow whole.   atorvastatin  80 MG tablet Commonly known as: LIPITOR  Take 1 tablet (80 mg total) by mouth at bedtime.   carvedilol  12.5 MG tablet Commonly known as: COREG  Take 1 tablet (12.5 mg total) by mouth 2 (two) times daily with a meal.   clopidogrel  75 MG tablet Commonly known as: Plavix  Take 1 tablet (75 mg total) by mouth daily.   FLUoxetine  40 MG capsule Commonly known as: PROZAC  Take 40 mg by mouth daily.   fluticasone  50 MCG/ACT nasal spray Commonly known as: FLONASE  Place 2 sprays into both nostrils daily as needed for allergies or rhinitis.   melatonin 5 MG Tabs Take 1 tablet (5 mg total) by mouth at bedtime as needed.   montelukast  10 MG tablet Commonly known as: SINGULAIR  Take 10 mg by mouth at bedtime.   naphazoline-glycerin  Soln Commonly known as: CLEAR EYES REDNESS Place 1-2 drops into the left eye 2 (two) times daily.   Ozempic  (0.25 or 0.5 MG/DOSE) 2 MG/3ML Sopn Generic drug: Semaglutide (0.25 or 0.5MG /DOS) Inject 0.5 mg  into the skin See admin instructions. Inject 0.5 mg subcutaneous every 10 days   pantoprazole  40 MG tablet Commonly known as: PROTONIX  Take 1 tablet (40 mg total) by mouth 2 (two) times daily. What changed: when to take this   senna-docusate 8.6-50 MG tablet Commonly known as: Senokot-S Take 1 tablet by mouth 2 (two) times daily.   sodium bicarbonate  650 MG  tablet Take 1 tablet (650 mg total) by mouth 3 (three) times daily.        Discharge Instructions: Please refer to Patient Instructions section of EMR for full details.  Patient was counseled important signs and symptoms that should prompt return to medical care, changes in medications, dietary instructions, activity restrictions, and follow up appointments.   Follow-Up Appointments: Call PCP to schedule follow up in one week Follow up with ophthalmology as scheduled on 05/30/24 Follow up with outpatient neurologist  Lonnie Earnest, MD 05/15/2024, 4:06 PM PGY-2, [facility] Family Medicine

## 2024-05-15 NOTE — Care Management (Signed)
 Transition of Care [facility]) - Inpatient Brief Assessment   Patient Details  Name: Christina Orozco MRN: [id number] Date of Birth: 1971-03-08  Transition of [facility] CM/SW Contact:    Corean JAYSON Canary, RN Phone Number: 05/15/2024, 12:31 PM   Clinical Narrative:  53 year old lives with daughter, has [employer] and is active with [facility] home health.  Messaged with Channing Ee from [facility] to verify disciplines.  Will be discharging with renewed home health services  No further needs identified. Transition of Care Asessment: Insurance and Status: Insurance coverage has been reviewed Patient has primary care physician: Yes Home environment has been reviewed: Lives with daughter Prior level of function:: with assist and It Consultant Home Services: Current home services (active with [facility]) Social Drivers of Health Review: SDOH reviewed no interventions necessary Readmission risk has been reviewed: Yes Transition of care needs: transition of care needs identified, TOC will continue to follow

## 2024-05-15 NOTE — Assessment & Plan Note (Signed)
 Hgb 7.5 this morning. Last iron  11/24 46, saturation 19, ferritin 82. Does not seem like she is on an ESA outpatient on my review.  - AM CBC

## 2024-05-15 NOTE — Assessment & Plan Note (Addendum)
-   neurology consulted, appreciate recs  - permissive HTN through tomorrow morning - continue statin, DAPT - neuro checks q4h - PT/OT recs

## 2024-05-15 NOTE — Progress Notes (Addendum)
 STROKE TEAM PROGRESS NOTE    SIGNIFICANT HOSPITAL EVENTS 05/15/2024: Admitted with increased right-sided weakness and MRI scan showed small left frontal subcortical corona radiata acute infarct.  Extensive changes of chronic small vessel disease and microhemorrhages  INTERIM HISTORY/SUBJECTIVE Pt was ambulating well with PT this morning. She reports that her weakness and difficulty with speech which were present yesterday have resolved back to baseline. However, she continues to report difficulty with her vision. Reports distorted vision with people's eyes being black, and the bottom half of people's faces being missing. She understands that these are distortions and has intact reality testing, though it is disturbing to the patient. Denies any history tobacco use, no alcohol  use in many years, and never any illicit drug use. Has been compliant with her Aspirin  and Brilinta  daily.    OBJECTIVE  CBC    Component Value Date/Time   WBC 4.2 05/15/2024 0312   RBC 2.55 (L) 05/15/2024 0312   HGB 7.5 (L) 05/15/2024 0312   HGB 9.8 (L) 08/20/2017 1503   HCT 24.0 (L) 05/15/2024 0312   HCT 29.2 (L) 08/20/2017 1503   PLT 169 05/15/2024 0312   PLT 308 08/20/2017 1503   MCV 94.1 05/15/2024 0312   MCV 84 08/20/2017 1503   MCH 29.4 05/15/2024 0312   MCHC 31.3 05/15/2024 0312   RDW 14.0 05/15/2024 0312   RDW 14.6 08/20/2017 1503   LYMPHSABS 1.5 05/14/2024 1551   LYMPHSABS 2.7 08/20/2017 1503   MONOABS 0.3 05/14/2024 1551   EOSABS 0.1 05/14/2024 1551   EOSABS 0.2 08/20/2017 1503   BASOSABS 0.0 05/14/2024 1551   BASOSABS 0.0 08/20/2017 1503    BMET    Component Value Date/Time   NA 143 05/15/2024 0312   NA 145 (H) 06/17/2020 1053   K 3.3 (L) 05/15/2024 0312   CL 116 (H) 05/15/2024 0312   CO2 23 05/15/2024 0312   GLUCOSE 115 (H) 05/15/2024 0312   BUN 34 (H) 05/15/2024 0312   BUN 28 (H) 06/17/2020 1053   CREATININE 3.01 (H) 05/15/2024 0312   CREATININE 1.73 (H) 04/23/2017 0943   CALCIUM   8.3 (L) 05/15/2024 0312   GFRNONAA 18 (L) 05/15/2024 0312   GFRNONAA 36 (L) 04/23/2017 0943    IMAGING past 24 hours MR BRAIN WO CONTRAST Result Date: 05/14/2024 EXAM: MRI BRAIN WITHOUT CONTRAST 05/14/2024 06:01:34 PM TECHNIQUE: Multiplanar multisequence MRI of the head/brain was performed without the administration of intravenous contrast. COMPARISON: CT from earlier the same day as well as previous MRI from 11/23/2023. CLINICAL HISTORY: Neuro deficit, acute, stroke suspected. FINDINGS: BRAIN AND VENTRICLES: Subcentimeter focus of diffusion signal abnormality seen involving the subcortical white matter of the left frontal centrum semiovale, suspicious for small acute to subacute infarction (series 2, image 36). No associated acute hemorrhage or significant mass effect. Patchy and confluent T2 and FLAIR hyperintensity involving the periventricular and subcortical hemispheres as well as the pons, consistent with chronic small vessel ischemic disease, advanced for age. Associated with volume loss about the middle cerebellar peduncles. Multiple superimposed remote lacunar infarcts about the deep gray nuclei and pons. Multiple chronic microhemorrhages noted, most pronounced about the deep gray nuclei and brainstem, most characteristic of chronic poorly controlled hypertension. No midline shift. No hydrocephalus. The sella is unremarkable. Normal flow voids. ORBITS: No acute abnormality. SINUSES AND MASTOIDS: No acute abnormality. BONES AND SOFT TISSUES: Normal marrow signal. A small lipoma is noted at the right frontal scalp. There is prior bilateral acromioclavicular joint placement. No acute soft tissue injury. IMPRESSION:  1. Few subcentimeter acute to early subacute small vessel type infarcts involving the left frontal centrum semiovale. No associated hemorrhage or mass effect. 2. Underlying advanced chronic microvascular ischemic disease. 3. Multiple chronic microhemorrhages as above, most likely related to  chronic poorly controlled hypertension. Electronically signed by: Morene Hoard MD 05/14/2024 07:11 PM EST RP Workstation: [id number]   CT HEAD WO CONTRAST Result Date: 05/14/2024 EXAM: CT HEAD WITHOUT CONTRAST 05/14/2024 04:06:34 PM TECHNIQUE: CT of the head was performed without the administration of intravenous contrast. Automated exposure control, iterative reconstruction, and/or weight based adjustment of the mA/kV was utilized to reduce the radiation dose to as low as reasonably achievable. COMPARISON: CT head 03/11/24 CLINICAL HISTORY: Neuro deficit, acute, stroke suspected FINDINGS: Motion limited study. Within this limitations. BRAIN AND VENTRICLES: No acute hemorrhage. No evidence of acute infarct. No hydrocephalus. No extra-axial collection. No mass effect or midline shift. Remote bilateral thalamic and left basal ganglia lacunar infarcts. Patchy white matter hypodensities are compatible with chronic microvascular ischemic disease. ORBITS: No acute abnormality. SINUSES: No acute abnormality. SOFT TISSUES AND SKULL: No acute soft tissue abnormality. No skull fracture. IMPRESSION: 1. No acute intracranial abnormality. Electronically signed by: Gilmore Molt MD 05/14/2024 04:13 PM EST RP Workstation: [id number]    Vitals:   05/14/24 2352 05/15/24 0028 05/15/24 0245 05/15/24 0600  BP:   138/78 (!) 161/82  Pulse: 76 74 73 80  Resp:   20 17  Temp:   98.9 F (37.2 C) 98.4 F (36.9 C)  TempSrc:   Oral Oral  SpO2: 100% 100% 100% 100%  Weight:      Height:         PHYSICAL EXAM General:  Alert, well-nourished, well-developed patient in no acute distress Psych:  Mood and affect appropriate for situation CV: Regular rate and rhythm on monitor Respiratory:  Regular, unlabored respirations on room air GI: Abdomen soft and nontender  NEURO:  Mental Status: AA&Ox3, patient is able to give clear and coherent history Speech/Language: speech is with some dysarthria, without or aphasia.   Naming, repetition, fluency, and comprehension intact.  Cranial Nerves:  II: PERRL.  R eye: Peripheral vision impairment from both left and right.    L eye: Inferior Altitudinal Visual Field Defect III, IV, VI: EOMI. Eyelids elevate symmetrically.  V: Sensation is intact to light touch and symmetrical to face.  VII: Face is symmetrical resting and smiling VIII: hearing intact to voice. IX, X: Palate elevates symmetrically. Phonation is normal.  KP:Dynloizm shrug 5/5. XII: tongue is midline without fasciculations. Motor: 5/5 strength to all muscle groups tested.  Tone: is normal and bulk is normal Sensation- Intact to light touch bilaterally. Extinction absent to light touch to DSS.   Coordination: FTN intact bilaterally, HKS: no ataxia in BLE.No drift.  Gait- Orozco L forefoot foot drag, able to ambulate comfortably with a walker.  Most Recent NIH 4     ASSESSMENT/PLAN  [name] is a 53 y.o. female with history of multiple strokes, HTN, Diabetes, HLD admitted for sudden R arm weakness, slurred speech, sudden vision changes.  Left subcortical acute lacunar Infarct and L Retinal artery branch occlusion:  Etiology:  Small vessel disease Code Stroke CT head:  No acute abnormality. CTA head & neck:  MRI: Few subcentimeter acute small vessel type infarcts of the L frontal centrum semiovale w/o hemorrhage. Advanced chronic micovascular ischemic disease. Carotid Doppler: pending Transcranial Doppler: Pending 2D Echo: (Recently completed in April 2025: EF 55-60%, ventricles and atria normal in size)  LDL 44 HgbA1c 5.1 VTE prophylaxis - Lovenox  30mg  aspirin  81 mg daily and Brilinta  (ticagrelor ) 90 mg bid prior to admission, now on aspirin  81 mg daily and clopidogrel  75 mg daily for 3 weeks and then Clopidogrel  75mg  daily alone. Therapy recommendations: Pt should continue with her home health Disposition:  Home, likely today pending further workup.  Hx of Stroke/TIA Pt has  had multiple strokes in the past, related to small vessel disease.  Atrial fibrillation - N/a Home Meds: none  Hypertension Home meds:  Norvasc  10mg  daily, Coreg  12.5mg  BID w/ meals Stable Blood Pressure Goal: SBP less than 160   Hyperlipidemia Home meds:  Lipitor  80mg  daily, resumed in hospital LDL 44, goal < 70 Continue statin at discharge  Diabetes type II Controlled Home meds:  Ozempic  HgbA1c 5.1, goal < 7.0  Tobacco Abuse - n/a - Pt has never used tobacco products  Substance Abuse - n/a Patient does not use illicit drugs or alcohol   Dysphagia Patient has post-stroke dysphagia, SLP consulted    Diet   Diet renal/carb modified with fluid restriction Diet-HS Snack? Nothing; Fluid restriction: 1200 mL Fluid; Room service appropriate? Yes; Fluid consistency: Thin  Advance diet as tolerated  Other Stroke Risk Factors ETOH use, alcohol  level <15 Obstructive sleep apnea, uses CPAP at home  Other Active Problems   Follow Up recommendations:  - Recommend pt follow up with her ophthalmologist for which she has a pre-arranged appointment set up for 12/19 for concern for her L retinal artery  Hospital day # 0  D. Penne Mori, DO, PGY1 Neurology Stroke Team  I have personally obtained history,examined this patient, reviewed notes, independently viewed imaging studies, participated in medical decision making and plan of care.ROS completed by me personally and pertinent positives fully documented  I have made any additions or clarifications directly to the above note. Agree with note above.  Patient presented with increased right-sided weakness due to an new left subcortical lacunar infarct with also complaints of vision loss in the left eye due to concurrent left retinal artery branch occlusion suspected.  Recommend aspirin  and Plavix  for 3 weeks followed by Plavix  alone and aggressive risk factor modification.  Mobilize out of bed.  Therapy consults.  Outpatient ophthalmology  appointment for left eye partial vision loss.   I personally spent a total of 50 minutes in the care of the patient today including getting/reviewing separately obtained history, performing a medically appropriate exam/evaluation, counseling and educating, placing orders, referring and communicating with other health care professionals, documenting clinical information in the EHR, independently interpreting results, and coordinating care.         Eather Popp, MD Medical Director [facility] Stroke Center Pager: [phone] 05/15/2024 3:14 PM   To contact Stroke Continuity provider, please refer to [email or web address]. After hours, contact General Neurology

## 2024-05-15 NOTE — Hospital Course (Addendum)
 This is a 53 year old female with PMH of recurrent CVA, HTN, T2DM, HLD, CKD, OSA who was admitted to the [facility] Medicine Teaching service for left frontal centrum semiovale stroke. Her hospital course is detailed below:  Acute stroke Patient presented to ED after right hand weakness morning of presentation.  In the ED, she reported feeling clinically back to baseline and CT head did not reveal acute abnormalities.  MRI brain showed few small acute to subacute small vessel infarcts in the left frontal centrum semiovale, as well as chronic small vessel ischemic changes.  Patient with history of multiple strokes in the past with negative work up. She is followed by neurology outpatient. Neurology was consulted and recommended discontinuation of home Brilinta  and new regimen of aspirin  81 mg for 21 days and Plavix  75 mg daily indefinitely.  Patient to continue atorvastatin .  Patient had normal cardiac telemetry during admission.  Carotid duplex and transcranial doppler obtained this admission, but final result still pending at time of discharge.  Given recent echocardiogram in April 2025, repeat was not performed.  PT/OT consulted and recommended returning home with home health. Patient was stable at time of discharge   Vision Changes Patient reported some concern for retinal detachment in October 2025 with follow up scheduled for 05/30/24. Patient reports since October, she has worsening of her vision. Evaluated by neurology who recommended outpatient follow up.   Other chronic medical conditions managed during this hospitalization include OSA, CKD, anemia, mild asthma, GAD, GERD.  PCP recommendations: Ophthalmology follow-up on 05/30/2024 Neurology follow-up Patient to consider HD for CKD in Feb 2026, consider further discussion

## 2024-05-15 NOTE — Evaluation (Signed)
 Occupational Therapy Evaluation Patient Details Name: Christina Orozco MRN: [id number] DOB: [birth year] Today's Date: 05/15/2024   History of Present Illness   53 yo F adm 05/14/24 with increased Rt weakness. MRI; Few subcentimeter acute to early subacute small vessel type infarcts  involving the left frontal centrum semiovale.  PMHx: HTN, T2DM with retinopathy, neuropathy, nephropathy, MDD, OSA, multiple CVA, CKD, HLD, asthma, anemia, depression, OSA on CPAP.     Clinical Impressions PTA, pt lives with daughter, reports Modified Independence with Rollator, has PCA assist 3x/wk for showering/IADLs and daughter assists as needed as well. Pt presents now functionally near baseline though reports acute L visual field deficits. Pt reports impaired perception of objects/people on L side (exaggerated eye size, incorrect eye colors, changing cabinet sizes). Varying reports with R vs L eye occlusion and pt reports symptoms not present in midline or on R side. As pt functionally near baseline, recommend continuation of HHOT and recommend follow up with neuro ophthalmologist.       If plan is discharge home, recommend the following:   A little help with bathing/dressing/bathroom;Assistance with cooking/housework;Direct supervision/assist for medications management;Assist for transportation     Functional Status Assessment   Patient has had a recent decline in their functional status and demonstrates the ability to make significant improvements in function in a reasonable and predictable amount of time.     Equipment Recommendations   None recommended by OT     Recommendations for Other Services         Precautions/Restrictions   Precautions Precautions: Fall Precaution/Restrictions Comments: impaired vision on L side Restrictions Weight Bearing Restrictions Per Provider Order: No     Mobility Bed Mobility Overal bed mobility: Modified Independent                   Transfers Overall transfer level: Needs assistance Equipment used: Rollator (4 wheels) Transfers: Sit to/from Stand Sit to Stand: Supervision                  Balance Overall balance assessment: Needs assistance Sitting-balance support: No upper extremity supported, Feet supported Sitting balance-Leahy Scale: Good     Standing balance support: Bilateral upper extremity supported, No upper extremity supported, During functional activity Standing balance-Leahy Scale: Fair                             ADL either performed or assessed with clinical judgement   ADL Overall ADL's : Needs assistance/impaired Eating/Feeding: Independent   Grooming: Supervision/safety;Standing   Upper Body Bathing: Supervision/ safety   Lower Body Bathing: Supervison/ safety   Upper Body Dressing : Supervision/safety   Lower Body Dressing: Minimal assistance;Sitting/lateral leans;Sit to/from stand   Toilet Transfer: Supervision/safety;Ambulation;Rollator (4 wheels)   Toileting- Clothing Manipulation and Hygiene: Supervision/safety         General ADL Comments: pt remains able to mobilize to/from bathroom with rollator per nursing and pt. able to mobilize in room in ED with rollator. Limited primarily by new vision issues     Vision Baseline Vision/History: 1 Wears glasses Ability to See in Adequate Light: 1 Impaired Patient Visual Report: Peripheral vision impairment (L visual perceptual changes) Vision Assessment?: Vision impaired- to be further tested in functional context;Yes Eye Alignment: Within Functional Limits Ocular Range of Motion: Impaired-to be further tested in functional context Alignment/Gaze Preference: Within Defined Limits Tracking/Visual Pursuits: Impaired - to be further tested in functional context Convergence: Impaired (comment) Visual Fields: Left visual  field deficit Diplopia Assessment: Other (comment) (reports double vision if wearing her  glasses. initally reports wearing glasses all the time for vision then later reports only wearing when watching tv, does not wear with mobility.) Additional Comments: tested without glasses per pt request as she says they cause double vision. varied reports on visual deficits (reporting issue with lateral rotation of L eye in 2023 that resolved wtihin a week. showed OT this video). Pt consistently reports impaired perception of people/objects on L side. reports seeing staff with eyes significantly larger than the other, seeing staff with black eyes and cabinets that change sizes). Inconsistent reports when assessed with R vs L eye occluded to look to L side. Pt with difficulty tracking to L and superiorly, losing gaze focus. Pt also reporting unable to tell how many fingers held up in lower L visual field. mild nystagmus laterally in L eye when looking to L side     Perception         Praxis         Pertinent Vitals/Pain Pain Assessment Pain Assessment: No/denies pain     Extremity/Trunk Assessment Upper Extremity Assessment Upper Extremity Assessment: Overall WFL for tasks assessed;Right hand dominant   Lower Extremity Assessment Lower Extremity Assessment: Defer to PT evaluation   Cervical / Trunk Assessment Cervical / Trunk Assessment: Normal   Communication Communication Communication: Other (comment);Impaired (minor slurred speech) Factors Affecting Communication: Reduced clarity of speech   Cognition Arousal: Alert Behavior During Therapy: WFL for tasks assessed/performed Cognition: No family/caregiver present to determine baseline             OT - Cognition Comments: distractible, difficulty articulating consistently vision issues                 Following commands: Intact       Cueing  General Comments   Cueing Techniques: Verbal cues;Gestural cues      Exercises     Shoulder Instructions      Home Living Family/patient expects to be discharged  to:: Private residence Living Arrangements: Children Available Help at Discharge: Family;Available PRN/intermittently;Personal care attendant Type of Home: Apartment Home Access: Level entry     Home Layout: One level     Bathroom Shower/Tub: Chief Strategy Officer: Standard     Home Equipment: Tub bench;Grab bars - tub/shower;Rollator (4 wheels);Rolling Walker (2 wheels);Cane - single point;BSC/3in1   Additional Comments: PCA 3 x week for 3-4 hours. currently working with HHPT/OT per pt      Prior Functioning/Environment Prior Level of Function : Needs assist             Mobility Comments: Ambulates with rollator - limited community ambulatory; denies recent falls ADLs Comments: mostly sedentary, PCA assists with showering tasks and daughter assisting on days when PCA not there. can dress self, warm up meals but reports hx of vision issues limiting ability to cook. SCAT and other transportation assist at baseline    OT Problem List: Impaired vision/perception;Decreased cognition   OT Treatment/Interventions: Self-care/ADL training;Therapeutic exercise;Energy conservation;DME and/or AE instruction;Therapeutic activities;Patient/family education;Balance training;Visual/perceptual remediation/compensation      OT Goals(Current goals can be found in the care plan section)   Acute Rehab OT Goals Patient Stated Goal: improve vision OT Goal Formulation: With patient Time For Goal Achievement: 05/29/24 Potential to Achieve Goals: Good   OT Frequency:  Min 2X/week    Co-evaluation              AM-PAC OT 6  Clicks Daily Activity     Outcome Measure Help from another person eating meals?: None Help from another person taking care of personal grooming?: A Little Help from another person toileting, which includes using toliet, bedpan, or urinal?: A Little Help from another person bathing (including washing, rinsing, drying)?: A Little Help from another  person to put on and taking off regular upper body clothing?: A Little Help from another person to put on and taking off regular lower body clothing?: A Little 6 Click Score: 19   End of Session Equipment Utilized During Treatment: Rollator (4 wheels) Nurse Communication: Mobility status  Activity Tolerance: Patient tolerated treatment well Patient left: in bed;with call bell/phone within reach  OT Visit Diagnosis: Low vision, both eyes (H54.2)                Time: 9151-9082 OT Time Calculation (min): 29 min Charges:  OT General Charges $OT Visit: 1 Visit OT Evaluation $OT Eval Low Complexity: 1 Low OT Treatments $Therapeutic Activity: 8-22 mins  Mliss NOVAK, OTR/L Acute Rehab Services Office: [phone]   Mliss Fish 05/15/2024, 10:14 AM

## 2024-05-15 NOTE — Progress Notes (Signed)
 Carotid duplex and transcranial doppler have been completed.    Results can be found under chart review under CV PROC. 05/15/2024 1:45 PM Kaleiah Kutzer RVT, RDMS

## 2024-05-15 NOTE — Progress Notes (Signed)
 Dischargd home. AVS reviewed with VeRN . IV removed. Meds picked up from TOC. Out via personal walker.SABRA

## 2024-05-15 NOTE — Assessment & Plan Note (Addendum)
 OSA: CPAP at bedtime Mild intermittent asthma - albuterol  PRN, singulair  10mg  qHS GAD - continue home fluoxetine  40mg  daily GERD - continue home protonix 

## 2024-05-15 NOTE — ED Notes (Signed)
Pt taken to vascular

## 2024-05-15 NOTE — Assessment & Plan Note (Signed)
 Hypokalemia 3.3 today. Repleted this morning.  Creatinine improved this AM. - AM BMP

## 2024-05-15 NOTE — ED Notes (Signed)
 Patient ambulated to restroom using personal Rolator. Steady gait. Reports feeling as though she does at baseline when ambulating.

## 2024-05-15 NOTE — ED Notes (Signed)
 Cleaned patient and provided new sheets & pull up after soiling the bed. In bed in no acute distress.  Remains A/O, moving all extremities, some slurred speech but easily understandable.

## 2024-05-23 ENCOUNTER — Telehealth: Payer: Self-pay

## 2024-05-23 NOTE — Telephone Encounter (Signed)
 Leita with [facility] is seeking a verbal order for continued physical therapy once a week for 7 weeks.  Also an order for an occupational evaluation.    Patient was seen in the hospital for another stroke evaluation.   Call back ph [phone]

## 2024-05-27 NOTE — Telephone Encounter (Signed)
 Christina Orozco had been advised.

## 2024-06-17 ENCOUNTER — Telehealth: Payer: Self-pay

## 2024-06-17 NOTE — Telephone Encounter (Signed)
 OT from [facility] called stating patient fell getitng out of bed, bruised but did not hit head. Also asking for Speech Therapy eval due to memory loss. Wants clarification on Ozempic  dosage. Office [phone] OT [phone]

## 2024-06-17 NOTE — Telephone Encounter (Signed)
(  Dr Carilyn aware). Pt now says did not hit head and the fall was from shower. Approval given for the ST eval and notified we do not rx Ozempic .

## 2024-06-24 ENCOUNTER — Encounter (HOSPITAL_COMMUNITY): Payer: Self-pay

## 2024-07-02 ENCOUNTER — Emergency Department (HOSPITAL_COMMUNITY)
Admission: EM | Admit: 2024-07-02 | Discharge: 2024-07-02 | Disposition: A | Discharge status: Home / Self Care | Attending: Emergency Medicine | Admitting: Emergency Medicine

## 2024-07-02 ENCOUNTER — Emergency Department (HOSPITAL_COMMUNITY)

## 2024-07-02 ENCOUNTER — Encounter (HOSPITAL_COMMUNITY): Payer: Self-pay

## 2024-07-02 ENCOUNTER — Other Ambulatory Visit: Payer: Self-pay

## 2024-07-02 DIAGNOSIS — R42 Dizziness and giddiness: Secondary | ICD-10-CM | POA: Insufficient documentation

## 2024-07-02 DIAGNOSIS — Z79899 Other long term (current) drug therapy: Secondary | ICD-10-CM | POA: Insufficient documentation

## 2024-07-02 DIAGNOSIS — Z7902 Long term (current) use of antithrombotics/antiplatelets: Secondary | ICD-10-CM | POA: Insufficient documentation

## 2024-07-02 DIAGNOSIS — E119 Type 2 diabetes mellitus without complications: Secondary | ICD-10-CM | POA: Insufficient documentation

## 2024-07-02 DIAGNOSIS — I10 Essential (primary) hypertension: Secondary | ICD-10-CM | POA: Insufficient documentation

## 2024-07-02 LAB — DIFFERENTIAL
Abs Immature Granulocytes: 0.01 K/uL (ref 0.00–0.07)
Basophils Absolute: 0 K/uL (ref 0.0–0.1)
Basophils Relative: 1 %
Eosinophils Absolute: 0.1 K/uL (ref 0.0–0.5)
Eosinophils Relative: 2 %
Immature Granulocytes: 0 %
Lymphocytes Relative: 42 %
Lymphs Abs: 1.8 K/uL (ref 0.7–4.0)
Monocytes Absolute: 0.2 K/uL (ref 0.1–1.0)
Monocytes Relative: 5 %
Neutro Abs: 2 K/uL (ref 1.7–7.7)
Neutrophils Relative %: 50 %

## 2024-07-02 LAB — URINE DRUG SCREEN
Amphetamines: NEGATIVE
Barbiturates: NEGATIVE
Benzodiazepines: NEGATIVE
Cocaine: NEGATIVE
Fentanyl: NEGATIVE
Methadone Scn, Ur: NEGATIVE
Opiates: NEGATIVE
Tetrahydrocannabinol: NEGATIVE

## 2024-07-02 LAB — CBC
HCT: 26.8 % — ABNORMAL LOW (ref 36.0–46.0)
Hemoglobin: 8.6 g/dL — ABNORMAL LOW (ref 12.0–15.0)
MCH: 29.2 pg (ref 26.0–34.0)
MCHC: 32.1 g/dL (ref 30.0–36.0)
MCV: 90.8 fL (ref 80.0–100.0)
Platelets: 215 K/uL (ref 150–400)
RBC: 2.95 MIL/uL — ABNORMAL LOW (ref 3.87–5.11)
RDW: 14.6 % (ref 11.5–15.5)
WBC: 4.1 K/uL (ref 4.0–10.5)
nRBC: 0 % (ref 0.0–0.2)

## 2024-07-02 LAB — I-STAT CHEM 8, ED
BUN: 33 mg/dL — ABNORMAL HIGH (ref 6–20)
Calcium, Ion: 1.18 mmol/L (ref 1.15–1.40)
Chloride: 108 mmol/L (ref 98–111)
Creatinine, Ser: 3.3 mg/dL — ABNORMAL HIGH (ref 0.44–1.00)
Glucose, Bld: 89 mg/dL (ref 70–99)
HCT: 27 % — ABNORMAL LOW (ref 36.0–46.0)
Hemoglobin: 9.2 g/dL — ABNORMAL LOW (ref 12.0–15.0)
Potassium: 4 mmol/L (ref 3.5–5.1)
Sodium: 145 mmol/L (ref 135–145)
TCO2: 23 mmol/L (ref 22–32)

## 2024-07-02 LAB — COMPREHENSIVE METABOLIC PANEL WITH GFR
ALT: 18 U/L (ref 0–44)
AST: 20 U/L (ref 15–41)
Albumin: 4 g/dL (ref 3.5–5.0)
Alkaline Phosphatase: 57 U/L (ref 38–126)
Anion gap: 10 (ref 5–15)
BUN: 33 mg/dL — ABNORMAL HIGH (ref 6–20)
CO2: 25 mmol/L (ref 22–32)
Calcium: 9.1 mg/dL (ref 8.9–10.3)
Chloride: 108 mmol/L (ref 98–111)
Creatinine, Ser: 3.07 mg/dL — ABNORMAL HIGH (ref 0.44–1.00)
GFR, Estimated: 18 mL/min — ABNORMAL LOW
Glucose, Bld: 90 mg/dL (ref 70–99)
Potassium: 4 mmol/L (ref 3.5–5.1)
Sodium: 143 mmol/L (ref 135–145)
Total Bilirubin: 0.3 mg/dL (ref 0.0–1.2)
Total Protein: 7 g/dL (ref 6.5–8.1)

## 2024-07-02 LAB — PROTIME-INR
INR: 1.1 (ref 0.8–1.2)
Prothrombin Time: 14.5 s (ref 11.4–15.2)

## 2024-07-02 LAB — APTT: aPTT: 31 s (ref 24–36)

## 2024-07-02 LAB — ETHANOL: Alcohol, Ethyl (B): <15 mg/dL

## 2024-07-02 LAB — CBG MONITORING, ED: Glucose-Capillary: 84 mg/dL (ref 70–99)

## 2024-07-02 MED ORDER — MECLIZINE HCL 25 MG PO TABS: 25.0000 mg | ORAL_TABLET | Freq: Three times a day (TID) | ORAL | 0 refills | Status: AC | PRN

## 2024-07-02 MED ORDER — MECLIZINE HCL 25 MG PO TABS
12.5000 mg | ORAL_TABLET | Freq: Once | ORAL | Status: AC
  Administered 2024-07-02: 12.5 mg via ORAL
  Filled 2024-07-02: qty 1

## 2024-07-02 MED ORDER — SODIUM CHLORIDE 0.9% FLUSH: 3.0000 mL | Freq: Once | INTRAVENOUS | Status: DC

## 2024-07-02 NOTE — Telephone Encounter (Signed)
 Patient states she has been dizzy for 2 weeks, she is unable to get out of bed.  Her daughter is there with her. Advised patient to go to ED for evaluation. Call EMS if no one is able to take her.  Patient agreed.

## 2024-07-02 NOTE — ED Provider Notes (Signed)
 " [place] [facility] Provider Note   CSN: [id number] Arrival date & time: 07/02/24  8167     Patient presents with: Dizziness   Christina Orozco is a 54 y.o. female history of previous stroke, hypertension, diabetes here presenting with dizziness.  Patient states that she has been having dizziness going on for about 2 weeks.  Patient is on meclizine  at baseline and has been taking it 3 times a day with minimal relief.  Patient states that she walks with a walker at baseline and she has been having hard time walking due to dizziness.  Patient went to see her doctor and was sent here for further evaluation.  Denies any trouble speaking.  Patient states that her right side is weaker from previous stroke but denies any left-sided weakness.   The history is provided by the patient.       Prior to Admission medications  Medication Sig Start Date End Date Taking? Authorizing Provider  meclizine  (ANTIVERT ) 25 MG tablet Take 1 tablet (25 mg total) by mouth 3 (three) times daily as needed for dizziness. 07/02/24  Yes Patt Alm Macho, MD  albuterol  (PROAIR  HFA) [phone] Base) MCG/ACT inhaler INHALE 1 OR 2 PUFFS INTO THE LUNGS EVERY 6 HOURS AS NEEDED FOR WHEEZING OR SHORTNESS OF BREATH Patient not taking: Reported on 05/15/2024 04/12/18   Angiulli, Toribio PARAS, PA-C  amLODipine  (NORVASC ) 10 MG tablet TAKE 1 TABLET (10 MG TOTAL) BY MOUTH DAILY. (AM) Patient taking differently: Take 10 mg by mouth in the morning. 03/05/23   Walker, Caitlin S, NP  atorvastatin  (LIPITOR ) 80 MG tablet Take 1 tablet (80 mg total) by mouth at bedtime. 10/05/22   Walker, Caitlin S, NP  carvedilol  (COREG ) 12.5 MG tablet TAKE 1 TABLET BY MOUTH 2 (TWO) TIMES DAILY WITH A MEAL (AM+PM) 05/16/24   Vannie Reche RAMAN, NP  clopidogrel  (PLAVIX ) 75 MG tablet Take 1 tablet (75 mg total) by mouth daily. 05/15/24   Alena Morrison, Reagan, MD  FLUoxetine  (PROZAC ) 40 MG capsule Take 40 mg by mouth daily.     [provider]  fluticasone  (FLONASE ) 50 MCG/ACT nasal spray Place 2 sprays into both nostrils daily as needed for allergies or rhinitis. Patient not taking: Reported on 05/15/2024 04/27/23   Love, Sharlet RAMAN, PA-C  melatonin 5 MG TABS Take 1 tablet (5 mg total) by mouth at bedtime as needed. Patient not taking: Reported on 05/15/2024 04/27/23   Love, Sharlet RAMAN, PA-C  montelukast  (SINGULAIR ) 10 MG tablet Take 10 mg by mouth at bedtime.    [provider]  naphazoline-glycerin  (CLEAR EYES REDNESS) 0.012-0.25 % SOLN Place 1-2 drops into the left eye 2 (two) times daily. Patient not taking: Reported on 05/15/2024 04/24/23   Love, Sharlet RAMAN, PA-C  OZEMPIC , 0.25 OR 0.5 MG/DOSE, 2 MG/3ML SOPN Inject 0.5 mg into the skin See admin instructions. Inject 0.5 mg subcutaneous every 10 days 04/14/22   [provider]  pantoprazole  (PROTONIX ) 40 MG tablet Take 1 tablet (40 mg total) by mouth 2 (two) times daily. Patient taking differently: Take 40 mg by mouth daily. 04/27/23   Love, Sharlet RAMAN, PA-C  senna-docusate (SENOKOT-S) 8.6-50 MG tablet Take 1 tablet by mouth 2 (two) times daily. 09/29/23   Jillian Buttery, MD  sodium bicarbonate  650 MG tablet Take 1 tablet (650 mg total) by mouth 3 (three) times daily. 09/29/23   Jillian Buttery, MD    Allergies: Lisinopril , Penicillins, Quetiapine, and Gabapentin   Review of Systems  Neurological:  Positive for dizziness.  All other systems reviewed and are negative.   Updated Vital Signs BP 136/76 (BP Location: Left Arm)   Pulse 78   Temp 97.9 F (36.6 C) (Oral)   Resp 15   Ht 5' 2.5 (1.588 m)   LMP 05/22/2018   SpO2 96%   BMI 25.92 kg/m   Physical Exam Vitals and nursing note reviewed.  Constitutional:      Appearance: Normal appearance.     Comments: Chronically ill  HENT:     Head: Normocephalic.     Nose: Nose normal.     Mouth/Throat:     Mouth: Mucous membranes are moist.  Eyes:     Extraocular Movements: Extraocular  movements intact.     Pupils: Pupils are equal, round, and reactive to light.     Comments: No obvious nystagmus  Cardiovascular:     Rate and Rhythm: Normal rate and regular rhythm.     Pulses: Normal pulses.     Heart sounds: Normal heart sounds.  Pulmonary:     Effort: Pulmonary effort is normal.     Breath sounds: Normal breath sounds.  Abdominal:     General: Abdomen is flat.     Palpations: Abdomen is soft.  Musculoskeletal:        General: Normal range of motion.     Cervical back: Normal range of motion and neck supple.  Skin:    General: Skin is warm.     Capillary Refill: Capillary refill takes less than 2 seconds.  Neurological:     Mental Status: She is alert.     Comments: Patient's strength is 4 out of 5 right arm and leg which is chronic.  Patient has a chronic right facial droop.  Left side strength is 5 out of 5.  Psychiatric:        Mood and Affect: Mood normal.     (all labs ordered are listed, but only abnormal results are displayed) Labs Reviewed  CBC - Abnormal; Notable for the following components:      Result Value   RBC 2.95 (*)    Hemoglobin 8.6 (*)    HCT 26.8 (*)    All other components within normal limits  COMPREHENSIVE METABOLIC PANEL WITH GFR - Abnormal; Notable for the following components:   BUN 33 (*)    Creatinine, Ser 3.07 (*)    GFR, Estimated 18 (*)    All other components within normal limits  I-STAT CHEM 8, ED - Abnormal; Notable for the following components:   BUN 33 (*)    Creatinine, Ser 3.30 (*)    Hemoglobin 9.2 (*)    HCT 27.0 (*)    All other components within normal limits  PROTIME-INR  APTT  DIFFERENTIAL  ETHANOL  URINE DRUG SCREEN  CBG MONITORING, ED    EKG: None  Radiology: CT HEAD WO CONTRAST Result Date: 07/02/2024 EXAM: CT HEAD WITHOUT CONTRAST 07/02/2024 09:31:00 PM TECHNIQUE: CT of the head was performed without the administration of intravenous contrast. Automated exposure control, iterative  reconstruction, and/or weight based adjustment of the mA/kV was utilized to reduce the radiation dose to as low as reasonably achievable. COMPARISON: MRI brain dated 05/14/2024. CLINICAL HISTORY: Vertigo, central. FINDINGS: BRAIN AND VENTRICLES: No acute hemorrhage. No evidence of acute infarct. Subcortical and periventricular small vessel ischemic changes, including in the subcortical left frontal lobe, at the site of the known prior infarct. Intracranial atherosclerosis. No hydrocephalus. No  extra-axial collection. No mass effect or midline shift. ORBITS: No acute abnormality. SINUSES: No acute abnormality. SOFT TISSUES AND SKULL: No acute soft tissue abnormality. No skull fracture. IMPRESSION: 1. No acute intracranial abnormality. 2. Small vessel ischemic changes with known prior subcortical left frontal infarct. Electronically signed by: Pinkie Pebbles MD 07/02/2024 09:37 PM EST RP Workstation: [id number]   MR BRAIN WO CONTRAST Result Date: 07/02/2024 EXAM: MRI BRAIN WITHOUT CONTRAST 07/02/2024 09:00:11 PM TECHNIQUE: Multiplanar multisequence MRI of the head/brain was performed without the administration of intravenous contrast. COMPARISON: None available. CLINICAL HISTORY: Neuro deficit, acute, stroke suspected FINDINGS: BRAIN AND VENTRICLES: No acute infarct. No intracranial hemorrhage. No mass. No midline shift. No hydrocephalus. Advanced T2 hyperintensities in the white matter (particularly the pons), compatible with chronic microvascular ischemic change. Multiple remote lacunar infarcts in the deep gray nuclei and pons. Multiple chronic microhemorrhages, most pronounced in the deep gray nuclei and brainstem Normal flow voids. ORBITS: No significant abnormality. SINUSES AND MASTOIDS: No significant abnormality. BONES AND SOFT TISSUES: Normal marrow signal. No soft tissue abnormality. IMPRESSION: 1. No acute abnormality. 2. Advanced chronic microvascular ischemic change. 3. Multiple chronic  microhemorrhages as above, most likely related to chronic hypertension. Electronically signed by: Glendia Molt MD 07/02/2024 09:36 PM EST RP Workstation: [id number]     Procedures   Medications Ordered in the ED  sodium chloride  flush (NS) 0.9 % injection 3 mL (has no administration in time range)  meclizine  (ANTIVERT ) tablet 12.5 mg (12.5 mg Oral Given 07/02/24 2112)                                    Medical Decision Making Anjali Manzella is a 54 y.o. female here presenting with dizziness.  Consider electrolyte abnormality versus stroke versus bleed.  Plan to get CBC and BMP and MRI brain and CT head  9:48 PM I reviewed patient's labs and creatinine is baseline at 3.3.  Electrolytes otherwise at baseline.  MRI and CT showed no acute stroke or hemorrhage.  Patient is prescribed meclizine  3 times today.  Patient can follow-up with neurology outpatient.   Amount and/or Complexity of Data Reviewed Labs: ordered. Radiology: ordered.     Final diagnoses:  Dizziness    ED Discharge Orders          Ordered    meclizine  (ANTIVERT ) 25 MG tablet  3 times daily PRN        07/02/24 2145               Patt Alm Macho, MD 07/02/24 2149  "

## 2024-07-02 NOTE — ED Triage Notes (Signed)
 Pt c.o dizziness for 2 weeks, denies headache. Denies n.v. Pt has hx of strokes so her doctor sent her here. Pt has residual right sided arm weakness from prior strokes. Pt also diagnosed with vertigo but states it is not getting any better.

## 2024-07-02 NOTE — Discharge Instructions (Signed)
 As we discussed, your MRI did not show any new stroke  You can take meclizine  25 mg 3 times daily as needed  Please follow-up with your neurologist as scheduled next week  Return to ER if you have worse dizziness or trouble speaking or weakness

## 2024-07-02 NOTE — ED Notes (Signed)
"  Pt in MRI at this time.  "

## 2024-07-02 NOTE — Telephone Encounter (Signed)
 Copied from CRM #[id number]. Topic: Clinical Concerns - Home Health/Living Facility >> Jul 02, 2024  1:12 PM Mitzie FALCON wrote: [facility] home health, Zacharia is calling other request    Include all details related to the request(s) below:  Zacharia, called in for patient at the patient home, patient is dizzy and unable to get out of bed.   Transfer call to lori at clinic.     Confirm and type the Best Contact Number below:  Patient/caller contact number:             [] Home  [] Mobile  [] Work [] Other   [] Okay to leave a voicemail   Medication List:  Current Outpatient Medications:    acetaminophen  (TYLENOL ) 325 mg tablet, Take 650 mg by mouth every 6 (six) hours as needed (pain)., Disp: , Rfl:    albuterol  HFA (PROVENTIL  HFA;VENTOLIN  HFA;PROAIR  HFA) 90 mcg/actuation inhaler, INHALE 1 TO 2 PUFFS INTO THE LUNGS EVERY 6 (SIX) HOURS AS NEEDED FOR WHEEZING OR SHORTNESS OF BREATH., Disp: 8.5 g, Rfl: 3   amLODIPine  (NORVASC ) 10 mg tablet, Take 1 tablet (10 mg total) by mouth daily., Disp: 90 tablet, Rfl: 3   aspirin  81 mg EC tablet, Take 1 tablet (81 mg total) by mouth daily. (Patient taking differently: Take 81 mg by mouth daily.), Disp: 90 tablet, Rfl: 3   atorvastatin  (LIPITOR ) 80 mg tablet, TAKE 1 TABLET BY MOUTH DAILY FOR CHOLESTEROL (BEDTIME), Disp: 90 tablet, Rfl: 2   carvediloL  (COREG ) 12.5 mg tablet, TAKE ONE TABLET BY MOUTH TWICE DAILY, Disp: 180 tablet, Rfl: 0   clopidogreL  (PLAVIX ) 75 mg tablet, Take 75 mg by mouth daily., Disp: , Rfl:    diclofenac sodium (VOLTAREN) 1 % gel, Apply  topically 2 (two) times a day., Disp: 100 g, Rfl: 3   FLUoxetine  (PROzac ) 40 mg capsule, Take 1 capsule (40 mg total) by mouth daily. (Patient taking differently: Take 40 mg by mouth daily.), Disp: 90 capsule, Rfl: 3   fluticasone  propionate (FLONASE ) 50 mcg/spray nasal spray, Administer 2 sprays into each nostril Once Daily., Disp: 16 g, Rfl: 0   meclizine  (ANTIVERT ) 25 mg tablet, Take 1  tablet (25 mg total) by mouth 3 (three) times a day as needed for dizziness., Disp: 30 tablet, Rfl: 2   miscellaneous medical supply (C-Tub) misc, Please dispense medium adult diapers, Disp: 100 each, Rfl: 3   miscellaneous medical supply (C-Tub) misc, Please dispense one rollator walker with seat and hand brakes, Disp: 1 each, Rfl: 0   miscellaneous medical supply misc, Please dispense one bedside commode, Disp: 1 each, Rfl: 0   montelukast  (SINGULAIR ) 10 mg tablet, TAKE 1 TABLET (10 MG TOTAL) BY MOUTH DAILY. (AM), Disp: 90 tablet, Rfl: 2   ondansetron  (ZOFRAN -ODT) 4 mg disintegrating tablet, TAKE 1 TABLET (4 MG TOTAL) BY MOUTH EVERY 8 (EIGHT) HOURS AS NEEDED FOR UP TO 15 DOSES FOR NAUSEA OR VOMITING., Disp: , Rfl:    Ozempic  0.25 mg or 0.5 mg (2 mg/3 mL) subcutaneous pen injector, INJECT 0.5 MG INTO THE SKIN ONCE A WEEK, Disp: 3 mL, Rfl: 3   pantoprazole  (PROTONIX ) 40 mg EC tablet, Take 1 tablet (40 mg total) by mouth 2 (two) times a day., Disp: 180 tablet, Rfl: 3   pen needle, diabetic 31 gauge x 3/16 ndle, BD Ultra-Fine Mini Pen Needle 31 gauge x 3/16.  Use one daily., Disp: 100 each, Rfl: 1   polysaccharide iron  complex (Nu-Iron ) 150 mg iron  capsule, Take 1 capsule (150 mg total) by mouth  2 (two) times a day., Disp: 60 capsule, Rfl: 6   sennosides-docusate sodium  (PERICOLACE) 8.6-50 mg per tablet, Take 1 tablet by mouth 2 (two) times a day., Disp: , Rfl:    sodium bicarbonate  650 mg tablet, Take 1 tablet (650 mg total) by mouth 2 (two) times a day., Disp: 180 tablet, Rfl: 3     Medication Request/Refills: Pharmacy Information (if applicable)   [] Not Applicable       []  Pharmacy listed  Send Medication Request to:                                                 [] Pharmacy not listed (added to pharmacy list in Epic) Send Medication Request to:      Listed Pharmacies: [facility] - [place], [place] - [address] - PHONE: [phone] - FAX:  [phone] [facility]

## 2024-07-02 NOTE — ED Provider Triage Note (Signed)
 Emergency Medicine Provider Triage Evaluation Note  Christina Orozco , a 54 y.o. female  was evaluated in triage.  Pt complains of speech deficit and vertigo.  She has a history of previous strokes.  She reports that this has been going on for about 2 weeks.  Symptoms seem to have worsened since yesterday.  Denies headache.  Review of Systems  Positive: Vertigo and speech change Negative:   Physical Exam  BP 136/76 (BP Location: Left Arm)   Pulse 78   Temp 97.9 F (36.6 C) (Oral)   Resp 15   Ht 5' 2.5 (1.588 m)   LMP 05/22/2018   SpO2 96%   BMI 25.92 kg/m  Gen:   Awake, no distress   Resp:  Normal effort  MSK:   Moves extremities without difficulty  Other:    Medical Decision Making  Medically screening exam initiated at 7:22 PM.  Appropriate orders placed.  Satori Brow was informed that the remainder of the evaluation will be completed by another provider, this initial triage assessment does not replace that evaluation, and the importance of remaining in the ED until their evaluation is complete.     Arloa Chroman, PA-C 07/02/24 2206

## 2024-07-08 ENCOUNTER — Other Ambulatory Visit: Payer: Self-pay | Admitting: Family

## 2024-07-08 DIAGNOSIS — I119 Hypertensive heart disease without heart failure: Secondary | ICD-10-CM

## 2024-07-18 NOTE — Progress Notes (Signed)
 PTT ordered as part of stroke order set

## 2024-08-28 ENCOUNTER — Encounter: Admitting: Psychology

## 2024-09-11 ENCOUNTER — Encounter: Admitting: Psychology

## 2024-09-25 ENCOUNTER — Encounter: Admitting: Psychology
